# Patient Record
Sex: Female | Born: 1945
Health system: Southern US, Community
[De-identification: ages and names within clinical notes are randomized; demographics above are authoritative.]

## PROBLEM LIST (undated history)

## (undated) ENCOUNTER — Emergency Department (HOSPITAL_BASED_OUTPATIENT_CLINIC_OR_DEPARTMENT_OTHER): Admission: EM | Payer: TRICARE For Life (TFL)

## (undated) ENCOUNTER — Emergency Department: Admission: EM | Payer: Commercial Managed Care - HMO | Source: Home / Self Care

## (undated) DIAGNOSIS — E039 Hypothyroidism, unspecified: Secondary | ICD-10-CM

## (undated) DIAGNOSIS — M1990 Unspecified osteoarthritis, unspecified site: Secondary | ICD-10-CM

## (undated) DIAGNOSIS — E0590 Thyrotoxicosis, unspecified without thyrotoxic crisis or storm: Secondary | ICD-10-CM

## (undated) DIAGNOSIS — IMO0002 Reserved for concepts with insufficient information to code with codable children: Secondary | ICD-10-CM

## (undated) DIAGNOSIS — R5383 Other fatigue: Secondary | ICD-10-CM

## (undated) DIAGNOSIS — I509 Heart failure, unspecified: Secondary | ICD-10-CM

## (undated) DIAGNOSIS — J45909 Unspecified asthma, uncomplicated: Secondary | ICD-10-CM

## (undated) DIAGNOSIS — F329 Major depressive disorder, single episode, unspecified: Secondary | ICD-10-CM

## (undated) DIAGNOSIS — F419 Anxiety disorder, unspecified: Secondary | ICD-10-CM

## (undated) DIAGNOSIS — C801 Malignant (primary) neoplasm, unspecified: Secondary | ICD-10-CM

## (undated) DIAGNOSIS — J329 Chronic sinusitis, unspecified: Secondary | ICD-10-CM

## (undated) DIAGNOSIS — M25519 Pain in unspecified shoulder: Secondary | ICD-10-CM

## (undated) DIAGNOSIS — M810 Age-related osteoporosis without current pathological fracture: Secondary | ICD-10-CM

## (undated) DIAGNOSIS — J309 Allergic rhinitis, unspecified: Secondary | ICD-10-CM

## (undated) DIAGNOSIS — J449 Chronic obstructive pulmonary disease, unspecified: Secondary | ICD-10-CM

## (undated) DIAGNOSIS — R5381 Other malaise: Secondary | ICD-10-CM

## (undated) DIAGNOSIS — J42 Unspecified chronic bronchitis: Secondary | ICD-10-CM

## (undated) DIAGNOSIS — Z9981 Dependence on supplemental oxygen: Secondary | ICD-10-CM

## (undated) DIAGNOSIS — I10 Essential (primary) hypertension: Secondary | ICD-10-CM

## (undated) DIAGNOSIS — I82409 Acute embolism and thrombosis of unspecified deep veins of unspecified lower extremity: Secondary | ICD-10-CM

## (undated) HISTORY — DX: Hypothyroidism, unspecified: E03.9

## (undated) HISTORY — DX: Pain in unspecified shoulder: M25.519

## (undated) HISTORY — DX: Unspecified asthma, uncomplicated: J45.909

## (undated) HISTORY — DX: Reserved for concepts with insufficient information to code with codable children: IMO0002

## (undated) HISTORY — DX: Essential (primary) hypertension: I10

## (undated) HISTORY — PX: APPENDECTOMY: SHX54

## (undated) HISTORY — PX: OOPHORECTOMY: SHX86

## (undated) HISTORY — DX: Chronic sinusitis, unspecified: J32.9

## (undated) HISTORY — DX: Allergic rhinitis, unspecified: J30.9

## (undated) HISTORY — DX: Age-related osteoporosis without current pathological fracture: M81.0

## (undated) HISTORY — PX: ECTOPIC PREGNANCY SURGERY: SHX613

## (undated) HISTORY — DX: Other fatigue: R53.83

## (undated) HISTORY — PX: DILATION AND CURETTAGE OF UTERUS: SHX78

## (undated) HISTORY — DX: Major depressive disorder, single episode, unspecified: F32.9

## (undated) HISTORY — DX: Thyrotoxicosis, unspecified without thyrotoxic crisis or storm: E05.90

## (undated) HISTORY — DX: Other malaise: R53.81

---

## 2001-05-31 ENCOUNTER — Other Ambulatory Visit: Admission: RE | Admit: 2001-05-31 | Discharge: 2001-05-31 | Payer: Self-pay | Admitting: Internal Medicine

## 2001-07-12 ENCOUNTER — Encounter: Payer: Self-pay | Admitting: Internal Medicine

## 2001-07-12 ENCOUNTER — Ambulatory Visit (HOSPITAL_COMMUNITY): Admission: RE | Admit: 2001-07-12 | Discharge: 2001-07-12 | Payer: Self-pay | Admitting: Internal Medicine

## 2003-04-11 ENCOUNTER — Encounter: Payer: Self-pay | Admitting: Emergency Medicine

## 2003-04-11 ENCOUNTER — Emergency Department (HOSPITAL_COMMUNITY): Admission: EM | Admit: 2003-04-11 | Discharge: 2003-04-11 | Payer: Self-pay | Admitting: Emergency Medicine

## 2004-05-22 ENCOUNTER — Other Ambulatory Visit: Admission: RE | Admit: 2004-05-22 | Discharge: 2004-05-22 | Payer: Self-pay | Admitting: Internal Medicine

## 2004-12-23 ENCOUNTER — Ambulatory Visit: Payer: Self-pay | Admitting: Internal Medicine

## 2005-03-08 ENCOUNTER — Emergency Department (HOSPITAL_COMMUNITY): Admission: EM | Admit: 2005-03-08 | Discharge: 2005-03-08 | Payer: Self-pay | Admitting: Emergency Medicine

## 2005-05-23 ENCOUNTER — Ambulatory Visit: Payer: Self-pay | Admitting: Internal Medicine

## 2005-05-28 ENCOUNTER — Ambulatory Visit: Payer: Self-pay | Admitting: Internal Medicine

## 2005-05-29 ENCOUNTER — Ambulatory Visit: Payer: Self-pay | Admitting: Internal Medicine

## 2005-06-05 ENCOUNTER — Ambulatory Visit: Payer: Self-pay | Admitting: Endocrinology

## 2005-06-12 ENCOUNTER — Encounter (HOSPITAL_COMMUNITY): Admission: RE | Admit: 2005-06-12 | Discharge: 2005-07-15 | Payer: Self-pay | Admitting: Endocrinology

## 2005-06-26 ENCOUNTER — Ambulatory Visit: Payer: Self-pay | Admitting: Endocrinology

## 2005-07-11 ENCOUNTER — Ambulatory Visit (HOSPITAL_COMMUNITY): Admission: RE | Admit: 2005-07-11 | Discharge: 2005-07-11 | Payer: Self-pay | Admitting: Endocrinology

## 2005-08-27 ENCOUNTER — Ambulatory Visit: Payer: Self-pay | Admitting: Endocrinology

## 2005-09-26 ENCOUNTER — Ambulatory Visit: Payer: Self-pay | Admitting: Endocrinology

## 2005-10-07 ENCOUNTER — Ambulatory Visit: Payer: Self-pay | Admitting: Internal Medicine

## 2005-11-03 ENCOUNTER — Ambulatory Visit: Payer: Self-pay | Admitting: Endocrinology

## 2005-12-18 ENCOUNTER — Ambulatory Visit: Payer: Self-pay | Admitting: Endocrinology

## 2005-12-24 ENCOUNTER — Encounter (HOSPITAL_COMMUNITY): Admission: RE | Admit: 2005-12-24 | Discharge: 2006-01-27 | Payer: Self-pay | Admitting: Endocrinology

## 2006-01-13 ENCOUNTER — Ambulatory Visit: Payer: Self-pay | Admitting: Endocrinology

## 2006-01-27 ENCOUNTER — Ambulatory Visit: Payer: Self-pay | Admitting: Endocrinology

## 2006-06-20 HISTORY — PX: ELECTROCARDIOGRAM: SHX264

## 2006-06-29 ENCOUNTER — Ambulatory Visit: Payer: Self-pay | Admitting: Internal Medicine

## 2006-07-06 ENCOUNTER — Ambulatory Visit: Payer: Self-pay | Admitting: Internal Medicine

## 2006-08-05 ENCOUNTER — Ambulatory Visit: Payer: Self-pay | Admitting: Endocrinology

## 2006-09-09 ENCOUNTER — Ambulatory Visit: Payer: Self-pay | Admitting: Endocrinology

## 2006-12-09 ENCOUNTER — Ambulatory Visit: Payer: Self-pay | Admitting: Endocrinology

## 2006-12-09 LAB — CONVERTED CEMR LAB: TSH: 29.25 microintl units/mL — ABNORMAL HIGH (ref 0.35–5.50)

## 2007-03-09 ENCOUNTER — Ambulatory Visit: Payer: Self-pay | Admitting: Endocrinology

## 2007-03-09 LAB — CONVERTED CEMR LAB
Creatinine, Ser: 1.3 mg/dL — ABNORMAL HIGH (ref 0.4–1.2)
GFR calc non Af Amer: 44 mL/min
Hgb A1c MFr Bld: 6 % (ref 4.6–6.0)
Potassium: 4.2 meq/L (ref 3.5–5.1)
Sed Rate: 12 mm/hr (ref 0–25)

## 2007-05-01 ENCOUNTER — Encounter: Payer: Self-pay | Admitting: Internal Medicine

## 2007-05-01 DIAGNOSIS — M810 Age-related osteoporosis without current pathological fracture: Secondary | ICD-10-CM | POA: Insufficient documentation

## 2007-05-01 DIAGNOSIS — I10 Essential (primary) hypertension: Secondary | ICD-10-CM | POA: Insufficient documentation

## 2007-05-01 DIAGNOSIS — J45909 Unspecified asthma, uncomplicated: Secondary | ICD-10-CM

## 2007-05-01 DIAGNOSIS — J309 Allergic rhinitis, unspecified: Secondary | ICD-10-CM | POA: Insufficient documentation

## 2007-05-01 HISTORY — DX: Age-related osteoporosis without current pathological fracture: M81.0

## 2007-05-01 HISTORY — DX: Essential (primary) hypertension: I10

## 2007-05-01 HISTORY — DX: Unspecified asthma, uncomplicated: J45.909

## 2007-05-01 HISTORY — DX: Allergic rhinitis, unspecified: J30.9

## 2007-05-10 ENCOUNTER — Ambulatory Visit: Payer: Self-pay | Admitting: Endocrinology

## 2007-05-18 ENCOUNTER — Ambulatory Visit: Payer: Self-pay | Admitting: Internal Medicine

## 2007-05-18 LAB — CONVERTED CEMR LAB
Alkaline Phosphatase: 68 units/L (ref 39–117)
BUN: 11 mg/dL (ref 6–23)
Basophils Absolute: 0.1 10*3/uL (ref 0.0–0.1)
CO2: 35 meq/L — ABNORMAL HIGH (ref 19–32)
Calcium: 9.9 mg/dL (ref 8.4–10.5)
Chloride: 96 meq/L (ref 96–112)
Creatinine, Ser: 1 mg/dL (ref 0.4–1.2)
Crystals: NEGATIVE
Direct LDL: 111 mg/dL
Eosinophils Relative: 1.5 % (ref 0.0–5.0)
GFR calc non Af Amer: 60 mL/min
Glucose, Bld: 73 mg/dL (ref 70–99)
HCT: 46.9 % — ABNORMAL HIGH (ref 36.0–46.0)
HDL: 69.3 mg/dL (ref >39.0)
MCHC: 33.8 g/dL (ref 30.0–36.0)
MCV: 97.7 fL (ref 78.0–100.0)
Monocytes Relative: 8.6 % (ref 3.0–11.0)
Neutrophils Relative %: 49.4 % (ref 43.0–77.0)
Potassium: 4.4 meq/L (ref 3.5–5.1)
RDW: 12.7 % (ref 11.5–14.6)
Squamous Epithelial / LPF: NEGATIVE /lpf
Total CHOL/HDL Ratio: 3
Total Protein, Urine: 30 mg/dL — AB
Total Protein: 7.2 g/dL (ref 6.0–8.3)
Triglycerides: 95 mg/dL (ref 0–149)
Urobilinogen, UA: 1 (ref 0.0–1.0)
VLDL: 19 mg/dL (ref 0–40)

## 2007-05-25 ENCOUNTER — Ambulatory Visit: Payer: Self-pay | Admitting: Internal Medicine

## 2007-05-26 ENCOUNTER — Ambulatory Visit: Payer: Self-pay | Admitting: Internal Medicine

## 2007-11-23 ENCOUNTER — Ambulatory Visit: Payer: Self-pay | Admitting: Endocrinology

## 2007-11-23 DIAGNOSIS — E0590 Thyrotoxicosis, unspecified without thyrotoxic crisis or storm: Secondary | ICD-10-CM | POA: Insufficient documentation

## 2007-11-25 LAB — CONVERTED CEMR LAB: TSH: 4.44 microintl units/mL (ref 0.35–5.50)

## 2008-06-01 ENCOUNTER — Encounter: Payer: Self-pay | Admitting: Internal Medicine

## 2008-07-18 ENCOUNTER — Ambulatory Visit: Payer: Self-pay | Admitting: Internal Medicine

## 2008-07-19 LAB — CONVERTED CEMR LAB
ALT: 19 units/L (ref 0–35)
AST: 30 units/L (ref 0–37)
Albumin: 3.9 g/dL (ref 3.5–5.2)
BUN: 5 mg/dL — ABNORMAL LOW (ref 6–23)
Basophils Relative: 0.5 % (ref 0.0–3.0)
CO2: 33 meq/L — ABNORMAL HIGH (ref 19–32)
Cholesterol: 195 mg/dL (ref 0–200)
Eosinophils Absolute: 0.2 10*3/uL (ref 0.0–0.7)
Eosinophils Relative: 3.3 % (ref 0.0–5.0)
GFR calc Af Amer: 72 mL/min
GFR calc non Af Amer: 60 mL/min
Glucose, Bld: 100 mg/dL — ABNORMAL HIGH (ref 70–99)
HCT: 46.8 % — ABNORMAL HIGH (ref 36.0–46.0)
HDL: 88.8 mg/dL (ref >39.0)
Hemoglobin: 16.3 g/dL — ABNORMAL HIGH (ref 12.0–15.0)
MCV: 101.9 fL — ABNORMAL HIGH (ref 78.0–100.0)
Monocytes Absolute: 0.6 10*3/uL (ref 0.1–1.0)
Neutro Abs: 1.9 10*3/uL (ref 1.4–7.7)
RBC / HPF: NONE SEEN
RBC: 4.59 M/uL (ref 3.87–5.11)
Total Bilirubin: 0.8 mg/dL (ref 0.3–1.2)
Triglycerides: 48 mg/dL (ref 0–149)
Urine Glucose: NEGATIVE mg/dL
Urobilinogen, UA: 1 (ref 0.0–1.0)

## 2008-07-21 ENCOUNTER — Ambulatory Visit: Payer: Self-pay | Admitting: Internal Medicine

## 2008-07-21 DIAGNOSIS — F329 Major depressive disorder, single episode, unspecified: Secondary | ICD-10-CM

## 2008-07-21 DIAGNOSIS — F3289 Other specified depressive episodes: Secondary | ICD-10-CM

## 2008-07-21 DIAGNOSIS — F418 Other specified anxiety disorders: Secondary | ICD-10-CM | POA: Insufficient documentation

## 2008-07-21 HISTORY — DX: Major depressive disorder, single episode, unspecified: F32.9

## 2008-07-21 HISTORY — DX: Other specified depressive episodes: F32.89

## 2008-08-25 ENCOUNTER — Ambulatory Visit: Payer: Self-pay | Admitting: Internal Medicine

## 2008-08-25 DIAGNOSIS — IMO0002 Reserved for concepts with insufficient information to code with codable children: Secondary | ICD-10-CM

## 2008-08-25 HISTORY — DX: Reserved for concepts with insufficient information to code with codable children: IMO0002

## 2008-09-03 ENCOUNTER — Encounter: Admission: RE | Admit: 2008-09-03 | Discharge: 2008-09-03 | Payer: Self-pay | Admitting: Internal Medicine

## 2008-09-07 ENCOUNTER — Encounter: Payer: Self-pay | Admitting: Internal Medicine

## 2008-10-10 ENCOUNTER — Ambulatory Visit: Payer: Self-pay | Admitting: Internal Medicine

## 2008-10-10 DIAGNOSIS — M549 Dorsalgia, unspecified: Secondary | ICD-10-CM | POA: Insufficient documentation

## 2008-10-10 DIAGNOSIS — J019 Acute sinusitis, unspecified: Secondary | ICD-10-CM | POA: Insufficient documentation

## 2009-06-07 ENCOUNTER — Encounter: Payer: Self-pay | Admitting: Internal Medicine

## 2009-06-18 ENCOUNTER — Telehealth (INDEPENDENT_AMBULATORY_CARE_PROVIDER_SITE_OTHER): Payer: Self-pay | Admitting: *Deleted

## 2009-06-22 ENCOUNTER — Ambulatory Visit: Payer: Self-pay | Admitting: Family Medicine

## 2009-06-22 ENCOUNTER — Encounter: Payer: Self-pay | Admitting: Internal Medicine

## 2009-07-12 ENCOUNTER — Telehealth: Payer: Self-pay | Admitting: Internal Medicine

## 2009-07-13 ENCOUNTER — Telehealth: Payer: Self-pay | Admitting: Internal Medicine

## 2009-07-18 ENCOUNTER — Telehealth: Payer: Self-pay | Admitting: Internal Medicine

## 2009-07-29 ENCOUNTER — Telehealth: Payer: Self-pay | Admitting: Family Medicine

## 2009-08-20 ENCOUNTER — Ambulatory Visit: Payer: Self-pay | Admitting: Internal Medicine

## 2009-08-22 LAB — CONVERTED CEMR LAB
ALT: 22 units/L (ref 0–35)
Albumin: 4 g/dL (ref 3.5–5.2)
BUN: 7 mg/dL (ref 6–23)
Basophils Absolute: 0.1 10*3/uL (ref 0.0–0.1)
Bilirubin, Direct: 0.1 mg/dL (ref 0.0–0.3)
CO2: 37 meq/L — ABNORMAL HIGH (ref 19–32)
Eosinophils Absolute: 0.1 10*3/uL (ref 0.0–0.7)
GFR calc non Af Amer: 71.61 mL/min (ref >60)
Glucose, Bld: 109 mg/dL — ABNORMAL HIGH (ref 70–99)
MCHC: 35.3 g/dL (ref 30.0–36.0)
Monocytes Relative: 5.8 % (ref 3.0–12.0)
Neutro Abs: 2.5 10*3/uL (ref 1.4–7.7)
Neutrophils Relative %: 46.3 % (ref 43.0–77.0)
Platelets: 199 10*3/uL (ref 150.0–400.0)
Potassium: 4.3 meq/L (ref 3.5–5.1)
RBC: 4.39 M/uL (ref 3.87–5.11)
RDW: 12.9 % (ref 11.5–14.6)
Total Bilirubin: 0.5 mg/dL (ref 0.3–1.2)
Total Protein, Urine: NEGATIVE mg/dL
Total Protein: 7.3 g/dL (ref 6.0–8.3)
Urine Glucose: NEGATIVE mg/dL
Urobilinogen, UA: 0.2 (ref 0.0–1.0)
VLDL: 9.6 mg/dL (ref 0.0–40.0)
WBC: 5.3 10*3/uL (ref 4.5–10.5)

## 2009-08-23 ENCOUNTER — Ambulatory Visit: Payer: Self-pay | Admitting: Internal Medicine

## 2009-08-23 DIAGNOSIS — M25519 Pain in unspecified shoulder: Secondary | ICD-10-CM

## 2009-08-23 DIAGNOSIS — E039 Hypothyroidism, unspecified: Secondary | ICD-10-CM

## 2009-08-23 HISTORY — DX: Pain in unspecified shoulder: M25.519

## 2009-08-23 HISTORY — DX: Hypothyroidism, unspecified: E03.9

## 2010-03-21 ENCOUNTER — Telehealth: Payer: Self-pay | Admitting: Internal Medicine

## 2010-06-25 ENCOUNTER — Encounter: Payer: Self-pay | Admitting: Internal Medicine

## 2010-06-27 ENCOUNTER — Encounter: Payer: Self-pay | Admitting: Internal Medicine

## 2010-06-28 ENCOUNTER — Encounter: Payer: Self-pay | Admitting: Internal Medicine

## 2010-07-03 ENCOUNTER — Encounter: Payer: Self-pay | Admitting: Internal Medicine

## 2010-07-03 LAB — HM MAMMOGRAPHY: HM Mammogram: NORMAL

## 2010-10-10 ENCOUNTER — Ambulatory Visit: Payer: Self-pay | Admitting: Internal Medicine

## 2010-10-10 ENCOUNTER — Encounter: Payer: Self-pay | Admitting: Internal Medicine

## 2010-10-10 DIAGNOSIS — J329 Chronic sinusitis, unspecified: Secondary | ICD-10-CM

## 2010-10-10 DIAGNOSIS — R5381 Other malaise: Secondary | ICD-10-CM

## 2010-10-10 DIAGNOSIS — R5383 Other fatigue: Secondary | ICD-10-CM | POA: Insufficient documentation

## 2010-10-10 HISTORY — DX: Chronic sinusitis, unspecified: J32.9

## 2010-10-10 HISTORY — DX: Other malaise: R53.81

## 2010-10-10 LAB — CONVERTED CEMR LAB
AST: 31 units/L (ref 0–37)
Albumin: 4.2 g/dL (ref 3.5–5.2)
Alkaline Phosphatase: 64 units/L (ref 39–117)
Basophils Absolute: 0 10*3/uL (ref 0.0–0.1)
Basophils Relative: 0.7 % (ref 0.0–3.0)
Calcium: 10.1 mg/dL (ref 8.4–10.5)
Chloride: 96 meq/L (ref 96–112)
Direct LDL: 102.1 mg/dL
Eosinophils Absolute: 0.1 10*3/uL (ref 0.0–0.7)
GFR calc non Af Amer: 73.91 mL/min (ref >60.00)
Hemoglobin: 16.7 g/dL — ABNORMAL HIGH (ref 12.0–15.0)
Lymphocytes Relative: 36.1 % (ref 12.0–46.0)
MCHC: 34.3 g/dL (ref 30.0–36.0)
MCV: 100.7 fL — ABNORMAL HIGH (ref 78.0–100.0)
Monocytes Absolute: 0.5 10*3/uL (ref 0.1–1.0)
Monocytes Relative: 7.4 % (ref 3.0–12.0)
Platelets: 214 10*3/uL (ref 150.0–400.0)
Potassium: 5 meq/L (ref 3.5–5.1)
RBC: 4.85 M/uL (ref 3.87–5.11)
Sodium: 136 meq/L (ref 135–145)
TSH: 6.06 microintl units/mL — ABNORMAL HIGH (ref 0.35–5.50)
Total Bilirubin: 0.8 mg/dL (ref 0.3–1.2)
Total CHOL/HDL Ratio: 2
Total Protein: 7.5 g/dL (ref 6.0–8.3)
VLDL: 7.8 mg/dL (ref 0.0–40.0)

## 2010-10-16 ENCOUNTER — Encounter: Payer: Self-pay | Admitting: Internal Medicine

## 2010-10-16 ENCOUNTER — Telehealth (INDEPENDENT_AMBULATORY_CARE_PROVIDER_SITE_OTHER): Payer: Self-pay | Admitting: *Deleted

## 2010-11-10 ENCOUNTER — Encounter: Payer: Self-pay | Admitting: Endocrinology

## 2010-11-19 NOTE — Progress Notes (Signed)
Summary: Med Refill  Phone Note Refill Request  on March 21, 2010 12:56 PM  Refills Requested: Medication #1:  ALPRAZOLAM 0.25 MG  TABS 1 by mouth two times a day as needed nerves - please make return office visit for further refills   Dosage confirmed as above?Dosage Confirmed   Last Refilled: 05/2009   Notes: [facility] [address] [phone] Initial call taken by: Scharlene Gloss,  March 21, 2010 12:56 PM  Follow-up for Phone Call        Rx faxed to pharmacy Follow-up by: Margaret Pyle, CMA,  March 21, 2010 1:20 PM    New/Updated Medications: ALPRAZOLAM 0.25 MG  TABS (ALPRAZOLAM) 1 by mouth two times a day as needed nerves - to fill june 2 , 2011 Prescriptions: ALPRAZOLAM 0.25 MG  TABS (ALPRAZOLAM) 1 by mouth two times a day as needed nerves - to fill june 2 , 2011  #60 x 2   Entered and Authorized by:   Corwin Levins MD   Signed by:   Corwin Levins MD on 03/21/2010   Method used:   Print then Give to Patient   RxID:   [id number]  done hardcopy to [facility] side B - dahlia Corwin Levins MD  March 21, 2010 1:12 PM

## 2010-11-21 NOTE — Medication Information (Signed)
Summary: Approval/medco  Approval/medco   Imported By: Lester Rippey 10/23/2010 08:26:53  _____________________________________________________________________  External Attachment:    Type:   Image     Comment:   External Document

## 2010-11-21 NOTE — Progress Notes (Signed)
Summary: PA-Actonel  Phone Note From Pharmacy   [facility]  [address] [id number] Summary of Call: PA required on Actonel 150mg  tab, okay to change med or preceed with PA?  [id number] ID#  [id number]  Case ID [id number] Initial call taken by: Margaret Pyle, CMA,  October 16, 2010 8:21 AM  Follow-up for Phone Call        ok for PA Follow-up by: Corwin Levins MD,  October 16, 2010 10:51 AM  Additional Follow-up for Phone Call Additional follow up Details #1::        Paperwork completed and faxed. Margaret Pyle, CMA  October 16, 2010 3:55 PM   PA- Actonel approved 09/25/10-10/17/11.  Additional Follow-up by: Dagoberto Reef,  October 25, 2010 9:12 AM

## 2010-11-21 NOTE — Assessment & Plan Note (Signed)
Summary: yearly medicare physical-lb   Vital Signs:  Patient profile:   65 year old female Height:      60 inches Weight:      105.38 pounds BMI:     20.66 O2 Sat:      97 % on Room air Temp:     97.8 degrees F oral Pulse rate:   66 / minute BP sitting:   110 / 64  (left arm) Cuff size:   regular  Vitals Entered By: Zella Ball Ewing CMA Duncan Dull) (October 10, 2010 10:12 AM)  O2 Flow:  Room air  Preventive Care Screening  Last Flu Shot:    Date:  10/10/2010    Results:  Fluvax 3+  Mammogram:    Date:  07/03/2010    Results:  normal   CC: Yearly/RE   CC:  Yearly/RE.  History of Present Illness: here for f/u; overall doing ok;  Pt denies CP, worsening sob, doe, wheezing, orthopnea, pnd, worsening LE edema, palps, dizziness or syncope Pt denies new neuro symptoms such as headache, facial or extremity weakness, no night time awakenings with wheezing, but has been using the proair more per month than prescribed.    Pt denies polydipsia, polyuria, or low sugar symptoms such as shakiness improved with eating.  Overall good compliance with meds, trying to follow low chol diet, wt stable, little excercise however No fever, wt loss, night sweats, loss of appetite or other constitutional symptoms Overall good compliance with meds, and good tolerability.  Denies worsening depressive symptoms, suicidal ideation, or panic.  Pt states good ability with ADL's, low fall risk, home safety reviewed and adequate, no significant change in hearing or vision, trying to follow lower chol diet, and occasionally active only with regular excercise. Denies hyper or hypothyroid symtpoms such as wt/voice/skin change.  Does have mild ongoing fatigue but no daytime somnolence.  Does have less sense of smell and bad breath over the last 2 months, without headache, sinus pain, pressure, d/c, or fever or blood, despite current meds  Preventive Screening-Counseling & Management      Drug Use:  no.    Problems Prior to  Update: 1)  Sinusitis, Chronic  (ICD-473.9) 2)  Fatigue  (ICD-780.79) 3)  Sinusitis- Acute-nos  (ICD-461.9) 4)  Need Prophylactic Vaccination&inoculation Flu  (ICD-V04.81) 5)  Shoulder Pain, Left  (ICD-719.41) 6)  Hypothyroidism  (ICD-244.9) 7)  Back Pain  (ICD-724.5) 8)  Sinusitis- Acute-nos  (ICD-461.9) 9)  Lumbar Radiculopathy, Right  (ICD-724.4) 10)  Depression  (ICD-311) 11)  Preventive Health Care  (ICD-V70.0) 12)  Hyperthyroidism  (ICD-242.90) 13)  Asthma  (ICD-493.90) 14)  Osteoporosis  (ICD-733.00) 15)  Hypertension  (ICD-401.9) 16)  Allergic Rhinitis  (ICD-477.9)  Medications Prior to Update: 1)  Calcium 600 600 Mg  Tabs (Calcium Carbonate) .... Take 1 Two Times A Day By Mouth Qd 2)  Triamterene-Hctz 37.5-25 Mg  Tabs (Triamterene-Hctz) .... Take 1 By Mouth Once Daily--No Addtnl Refills Pt Due For Appt With Physcian 3)  Alprazolam 0.25 Mg  Tabs (Alprazolam) .[name].. 1 By Mouth Two Times A Day As Needed Nerves - To Fill June 2 , 2011 4)  Albuterol 90 Mcg/act  Aers (Albuterol) .... Use Asd 2 Puffs Qid As Needed 5)  Levothyroxine Sodium 50 Mcg Tabs (Levothyroxine Sodium) .[name].. 1po Once Daily 6)  Advair Diskus 100-50 Mcg/dose  Misc (Fluticasone-Salmeterol) .[name].. 1 Puff Two Times A Day 7)  Citalopram Hydrobromide 10 Mg Tabs (Citalopram Hydrobromide) .[name].. 1po Once Daily 8)  Adult Aspirin  Ec Low Strength 81 Mg Tbec (Aspirin) .[name].. 1 By Mouth Once Daily 9)  Cetirizine Hcl 10 Mg Tabs (Cetirizine Hcl) .[name].. 1po Once Daily As Needed Allergy 10)  Actonel 150 Mg Tabs (Risedronate Sodium) .[name].. 1 By Mouth Q Month 11)  Proair Hfa 108 (90 Base) Mcg/act Aers (Albuterol Sulfate) .... Take 2 Puffs Qid As Needed 12)  Cephalexin 500 Mg Caps (Cephalexin) .[name].. 1 By Mouth Three Times A Day  Current Medications (verified): 1)  Calcium 600 600 Mg  Tabs (Calcium Carbonate) .... Take 1 Two Times A Day By Mouth Qd 2)  Triamterene-Hctz 37.5-25 Mg  Tabs (Triamterene-Hctz) .... Take 1 By Mouth Once Daily 3)   Alprazolam 0.25 Mg  Tabs (Alprazolam) .[name].. 1 By Mouth Two Times A Day As Needed Nerves - To Fill Oct 10, 2010 4)  Levothyroxine Sodium 50 Mcg Tabs (Levothyroxine Sodium) .[name].. 1po Once Daily 5)  Advair Diskus 250-50 Mcg/dose Aepb (Fluticasone-Salmeterol) .[name].. 1 Puff Two Times A Day 6)  Citalopram Hydrobromide 10 Mg Tabs (Citalopram Hydrobromide) .[name].. 1po Once Daily 7)  Adult Aspirin Ec Low Strength 81 Mg Tbec (Aspirin) .[name].. 1 By Mouth Once Daily 8)  Cetirizine Hcl 10 Mg Tabs (Cetirizine Hcl) .[name].. 1po Once Daily As Needed Allergy 9)  Actonel 150 Mg Tabs (Risedronate Sodium) .[name].. 1 By Mouth Q Month 10)  Proair Hfa 108 (90 Base) Mcg/act Aers (Albuterol Sulfate) .... Take 2 Puffs Qid As Needed 11)  Fluticasone Propionate 50 Mcg/act Susp (Fluticasone Propionate) .... 2 Spray/side Once Daily  Allergies (verified): 1)  ! Fosamax 2)  ! * Asprin  Past History:  Past Medical History: Last updated: 07/21/2008 Allergic rhinitis Hypertension Osteoporosis Hypothyroidism History of Peptic Ulcers Disease History of Degenarative joint disease of right shoulder Asthma Depression  Past Surgical History: Last updated: 05/01/2007 Appendectomy Ectopic Pregnacy Post Fallopian tube removal on right side History of multiple dialations and curettages and miscarriages, unfortunaely never carring a child to term. 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   Head:  normocephalic and atraumatic.   Eyes:  vision grossly intact, pupils equal, and pupils round.   Ears:  R ear normal and L ear normal.   Nose:  nasal dischargemucosal pallor and mucosal edema.   Mouth:  pharyngeal erythema and fair dentition.   Neck:  supple and no masses.   Lungs:  normal respiratory effort and normal breath sounds.   Heart:  normal rate and regular rhythm.   Abdomen:  soft, non-tender, and normal bowel sounds.   Msk:  no joint tenderness and no joint swelling.   Extremities:  no edema, no erythema  Neurologic:  strength normal in all extremities and gait normal.   Skin:  color normal and no rashes.   Psych:  not depressed appearing and slightly anxious.     Impression & Recommendations:  Problem # 1:  SINUSITIS, CHRONIC (ICD-473.9)  to start the flonase nasal, Continue all previous  medications as before this visit, consider allergy referral  The following medications were removed from the medication list:    Cephalexin 500 Mg Caps (Cephalexin) .[name]... 1 by mouth three times a day Her updated medication list for this problem includes:    Fluticasone Propionate 50 Mcg/act Susp (Fluticasone propionate) .[name]... 2 spray/side once daily treat as above, f/u any worsening signs or symptoms   Problem # 2:  OSTEOPOROSIS (ICD-733.00)  Her updated medication list for this problem includes:    Actonel 150 Mg Tabs (Risedronate sodium) .[name]... 1 by mouth q month to re-start the actonel, tolerated well, most recent dxa d/w pt  Problem # 3:  ASTHMA (ICD-493.90)  The following medications were removed from the medication list:    Albuterol 90 Mcg/act Aers (Albuterol) ..... Use asd 2 puffs qid as needed Her updated medication list for this problem includes:    Advair Diskus 250-50 Mcg/dose Aepb (Fluticasone-salmeterol) .[name]... 1 puff two times a day    Proair Hfa 108 (90 Base) Mcg/act Aers (Albuterol sulfate) .[name]... Take 2 puffs qid  as needed to increase the advair for uncotnrolled symtpoms, cont the proair hfa as is  Problem # 4:  HYPERTENSION (ICD-401.9)  Her updated medication list for this problem includes:    Triamterene-hctz 37.5-25 Mg Tabs (Triamterene-hctz) .[name]... Take 1 by mouth once daily  BP today: 110/64 Prior BP: 112/80 (08/23/2009)  Labs Reviewed: K+: 4.3 (08/20/2009) Creat: : 1.0 (08/20/2009)   Chol: 202 (08/20/2009)   HDL: 94.90 (08/20/2009)   LDL: 97 (07/18/2008)   TG: 48.0 (08/20/2009) stable overall by hx and exam, ok to continue meds/tx as is   Problem # 5:  FATIGUE (ICD-780.79) exam benign, to check labs below; follow with expectant management  Orders: TLB-BMP (Basic Metabolic Panel-BMET) (80048-METABOL) TLB-CBC Platelet - w/Differential (85025-CBCD) TLB-Hepatic/Liver Function Pnl (80076-HEPATIC) TLB-TSH (Thyroid Stimulating Hormone) (84443-TSH)  Problem # 6:  HYPOTHYROIDISM (ICD-244.9)  Her updated medication list for this problem includes:    Levothyroxine Sodium 50 Mcg Tabs (Levothyroxine sodium) .[name]... 1po once daily  Orders: TLB-Lipid Panel (80061-LIPID) stable overall by hx and exam, ok to continue meds/tx as is   Complete Medication List: 1)  Calcium 600 600 Mg Tabs (Calcium carbonate) .... Take 1 two times a day by mouth qd 2)  Triamterene-hctz 37.5-25 Mg Tabs (Triamterene-hctz) .... Take 1 by mouth once daily 3)  Alprazolam 0.25 Mg Tabs (Alprazolam) .[name].. 1 by mouth two times a day as needed nerves - to fill Oct 10, 2010 4)  Levothyroxine Sodium 50 Mcg Tabs (Levothyroxine sodium) .[name].. 1po once daily 5)  Advair Diskus 250-50 Mcg/dose Aepb (Fluticasone-salmeterol) .[name].. 1 puff two times a day 6)  Citalopram Hydrobromide 10 Mg Tabs (Citalopram hydrobromide) .[name].. 1po once daily 7)  Adult Aspirin Ec Low Strength 81 Mg Tbec (Aspirin) .[name].. 1 by mouth once daily 8)  Cetirizine Hcl 10 Mg Tabs (Cetirizine hcl) .[name].. 1po once daily as needed allergy 9)  Actonel 150 Mg Tabs (Risedronate  sodium) .[name].. 1 by mouth q month 10)  Proair Hfa 108 (90 Base) Mcg/act Aers (Albuterol sulfate) .... Take 2 puffs qid as needed 11)  Fluticasone Propionate 50 Mcg/act Susp (Fluticasone propionate) .... 2 spray/side once daily  Other Orders: EKG w/ Interpretation (93000) Flu Vaccine 83yrs + MEDICARE PATIENTS (Z6109) Administration Flu vaccine - MCR (U0454)  Patient Instructions: 1)  you had the flu shot today 2)  you are given the refills today - all sent to the pharmacy except for the alprazolam 3)  please stop smoking 4)  Please go to the Lab in the basement for your blood and/or urine tests today 5)  Please call the number on the [facility] Card for results of your testing  6)  Please schedule a follow-up appointment in 6 months. Prescriptions: TRIAMTERENE-HCTZ 37.5-25 MG  TABS (TRIAMTERENE-HCTZ) TAKE 1 by mouth once daily  #90 x 3   Entered and Authorized by:   Corwin Levins MD   Signed by:   Corwin Levins MD on 10/10/2010   Method used:   [facility]        [facility]  [address] [id number]       [address]       [place], [place]  [address]       Ph: [phone] or [phone]       Fax: [phone]   RxID:   [id number] ALPRAZOLAM 0.25 MG  TABS (ALPRAZOLAM) 1 by mouth two times a day as needed nerves - to fill Oct 10, 2010  #60 x 2   Entered and Authorized by:   Corwin Levins MD   Signed by:   Corwin Levins MD on 10/10/2010   Method used:   Print then Give to Patient   RxID:   [id number] LEVOTHYROXINE SODIUM 50 MCG TABS (LEVOTHYROXINE SODIUM) 1po once daily  #90 x 3   Entered and Authorized by:   Corwin Levins MD   Signed by:   Corwin Levins MD on 10/10/2010   Method used:   [facility]        [facility]  [address] [id number]       [address]       [place], [place]  [address]       Ph: [phone] or [phone]       Fax: [phone]   RxID:   [id number] ADVAIR DISKUS 250-50 MCG/DOSE AEPB  (FLUTICASONE-SALMETEROL) 1 puff two times a day  #1 x 11   Entered and Authorized by:   Corwin Levins MD   Signed by:   Corwin Levins MD on 10/10/2010   Method used:   [facility]ically [facility]        [facility]  [address] [id number]       [address]       [place], [place]  [address]       Ph: [phone] or [phone]       Fax: [phone]   RxID:   [id number] CITALOPRAM HYDROBROMIDE 10 MG TABS (CITALOPRAM HYDROBROMIDE) 1po once daily  #90 x 3   Entered and Authorized by:   Corwin Levins MD   Signed by:   Corwin Levins MD on 10/10/2010   Method used:   [facility]        [facility]  [address] [facility]       [address]       [place], [place]  [address]       Ph: [phone] or [phone]       Fax: [phone]   RxID:   [id number] CETIRIZINE HCL 10 MG TABS (CETIRIZINE HCL) 1po once daily as needed allergy  #30 x 11   Entered and Authorized by:   Corwin Levins MD   Signed by:   Corwin Levins MD on 10/10/2010  Method used:   [facility]        [facility]  [address] [facility]       [address]       [place], [place]  [address]       Ph: [phone] or [phone]       Fax: [phone]   RxID:   [id number] ACTONEL 150 MG TABS (RISEDRONATE SODIUM) 1 by mouth q month  #3 x 3   Entered and Authorized by:   Corwin Levins MD   Signed by:   Corwin Levins MD on 10/10/2010   Method used:   [facility]ically [facility]        [facility]  [address] [facility]       [address]       [place], [place]  [address]       Ph: [phone] or [phone]       Fax: [phone]   RxID:   [id number] PROAIR HFA 108 (90 BASE) MCG/ACT AERS (ALBUTEROL SULFATE) take 2 puffs qid as needed  #1 x 11   Entered and Authorized by:   Corwin Levins MD   Signed by:   Corwin Levins MD on 10/10/2010   Method used:   [facility]ronically to        [facility]  [address] [facility]       [address]        [place], [place]  [address]       Ph: [phone] or [phone]       Fax: [phone]   RxID:   [id number] FLUTICASONE PROPIONATE 50 MCG/ACT SUSP (FLUTICASONE PROPIONATE) 2 spray/side once daily  #1 x 11   Entered and Authorized by:   Corwin Levins MD   Signed by:   Corwin Levins MD on 10/10/2010   Method used:   [facility]        [facility]  [address] [id number]       [address]       [place], [place]  [address]       Ph: [phone] or [phone]       Fax: [phone]   RxID:   [id number]    Orders Added: 1)  EKG w/ Interpretation [93000] 2)  Flu Vaccine 26yrs + MEDICARE PATIENTS [Q2039] 3)  Administration Flu vaccine - MCR [G0008] 4)  TLB-BMP (Basic Metabolic Panel-BMET) [80048-METABOL] 5)  TLB-CBC Platelet - w/Differential [85025-CBCD] 6)  TLB-Hepatic/Liver Function Pnl [80076-HEPATIC] 7)  TLB-TSH (Thyroid Stimulating Hormone) [84443-TSH] 8)  TLB-Lipid Panel [80061-LIPID] 9)  Est. Patient Level IV [76734]   Immunizations Administered:  Influenza Vaccine # 1:    Vaccine Type: Fluvax 3+    Site: left deltoid    Mfr: Sanofi Pasteur    Dose: 0.5 ml    Route: IM    Given by: Zella Ball Ewing CMA (AAMA)    Exp. Date: 04/19/2011    Lot #: [id number]    VIS given: 05/14/10 version given October 10, 2010.   Immunizations Administered:  Influenza Vaccine # 1:    Vaccine Type: Fluvax 3+    Site: left deltoid    Mfr: Sanofi Pasteur    Dose: 0.5 ml    Route: IM    Given by: Zella Ball Ewing CMA (AAMA)    Exp. Date: 04/19/2011  Lot #: [id number]    VIS given: 05/14/10 version given October 10, 2010.

## 2011-04-09 ENCOUNTER — Encounter: Payer: Self-pay | Admitting: Internal Medicine

## 2011-04-09 DIAGNOSIS — Z515 Encounter for palliative care: Secondary | ICD-10-CM | POA: Insufficient documentation

## 2011-04-10 ENCOUNTER — Ambulatory Visit: Payer: Self-pay | Admitting: Internal Medicine

## 2011-04-10 DIAGNOSIS — Z0289 Encounter for other administrative examinations: Secondary | ICD-10-CM

## 2011-04-14 ENCOUNTER — Other Ambulatory Visit: Payer: Self-pay

## 2011-04-14 MED ORDER — ALPRAZOLAM 0.25 MG PO TABS: 0.2500 mg | ORAL_TABLET | Freq: Two times a day (BID) | ORAL | Status: DC

## 2011-04-14 NOTE — Telephone Encounter (Signed)
Faxed hardcopy to pharmacy. 

## 2011-07-10 ENCOUNTER — Ambulatory Visit (INDEPENDENT_AMBULATORY_CARE_PROVIDER_SITE_OTHER): Payer: MEDICARE

## 2011-07-10 ENCOUNTER — Telehealth: Payer: Self-pay

## 2011-07-10 DIAGNOSIS — Z0000 Encounter for general adult medical examination without abnormal findings: Secondary | ICD-10-CM

## 2011-07-10 DIAGNOSIS — Z23 Encounter for immunization: Secondary | ICD-10-CM

## 2011-07-10 NOTE — Telephone Encounter (Signed)
Put order in for physical labs. 

## 2011-07-31 ENCOUNTER — Telehealth: Payer: Self-pay

## 2011-07-31 NOTE — Telephone Encounter (Signed)
Patient called informing of UTI symptoms and wanting to discuss with Dr. Jonny Ruiz a portable nebulizer. Informed the patient would need to schedule OV with Dr. Jonny Ruiz, she agreed to do so.

## 2011-08-11 ENCOUNTER — Encounter: Payer: Self-pay | Admitting: Internal Medicine

## 2011-08-11 ENCOUNTER — Ambulatory Visit (INDEPENDENT_AMBULATORY_CARE_PROVIDER_SITE_OTHER): Payer: MEDICARE | Admitting: Internal Medicine

## 2011-08-11 VITALS — BP 108/60 | HR 85 | Temp 98.0°F | Ht 61.0 in | Wt 117.2 lb

## 2011-08-11 DIAGNOSIS — I10 Essential (primary) hypertension: Secondary | ICD-10-CM

## 2011-08-11 DIAGNOSIS — N760 Acute vaginitis: Secondary | ICD-10-CM | POA: Insufficient documentation

## 2011-08-11 DIAGNOSIS — N390 Urinary tract infection, site not specified: Secondary | ICD-10-CM

## 2011-08-11 DIAGNOSIS — M549 Dorsalgia, unspecified: Secondary | ICD-10-CM

## 2011-08-11 LAB — POCT URINALYSIS DIPSTICK
Bilirubin, UA: NEGATIVE
Blood, UA: NEGATIVE
Glucose, UA: NEGATIVE
Protein, UA: NEGATIVE
Spec Grav, UA: 1.002

## 2011-08-11 MED ORDER — METRONIDAZOLE 250 MG PO TABS: 250.0000 mg | ORAL_TABLET | Freq: Three times a day (TID) | ORAL | Status: AC

## 2011-08-11 MED ORDER — CELECOXIB 200 MG PO CAPS: 200.0000 mg | ORAL_CAPSULE | Freq: Two times a day (BID) | ORAL | Status: DC

## 2011-08-11 NOTE — Progress Notes (Signed)
Subjective:    Patient ID: Jasmine [name], female    DOB: 11-26-1945, 65 y.o.   MRN: [id number]  HPI  Here to f/u;  Had recent pap per NP at her GYN with neg pap but tx for vaginitis with metronidazole course for 5 days with clearing of the d/c and di dnot have to do the cream rx as well, still has the tube and asks for re-tx and will make sure she does the cream as well.   Denies urinary symptoms such as dysuria, frequency, urgency,or hematuria. UA dip in the office neg today.  Also mentions she is s/p RLE fx 2 yrs ago, and Pt continues to have recurring LBP without change in severity except mild increase in the past 2 wks, worse at night, better during the day, did better in the past with celebrex, but no bowel or bladder change, fever, wt loss,  worsening LE pain/numbness/weakness, gait change or falls.  Pt denies chest pain, increased sob or doe, wheezing, orthopnea, PND, increased LE swelling, palpitations, dizziness or syncope.  Pt denies new neurological symptoms such as new headache, or facial or extremity weakness or numbness   Pt denies polydipsia, polyuria. Past Medical History  Diagnosis Date  . ALLERGIC RHINITIS 05/01/2007  . ASTHMA 05/01/2007  . BACK PAIN 10/10/2008  . DEPRESSION 07/21/2008  . FATIGUE 10/10/2010  . HYPERTENSION 05/01/2007  . HYPERTHYROIDISM 11/23/2007  . HYPOTHYROIDISM 08/23/2009  . LUMBAR RADICULOPATHY, RIGHT 08/25/2008  . OSTEOPOROSIS 05/01/2007  . SHOULDER PAIN, LEFT 08/23/2009  . SINUSITIS, CHRONIC 10/10/2010  . SINUSITIS- ACUTE-NOS 10/10/2008   Past Surgical History  Procedure Date  . Appendectomy   . Ectopic pregnancy surgery   . Post fallopian tube removal right side   . History of multiple dialations and curettages and miscarriages, unfortunately never carrying a child to term   . Electrocardiogram 06/20/2006    reports that she has been smoking.  She does not have any smokeless tobacco history on file. She reports that she drinks alcohol. She reports that she  does not use illicit drugs. family history includes Alcohol abuse in her brother; Diabetes in her brother; and Hypertension in her other. Allergies  Allergen Reactions  . Alendronate Sodium    Current Outpatient Prescriptions on File Prior to Visit  Medication Sig Dispense Refill  . albuterol (PROAIR HFA) 108 (90 BASE) MCG/ACT inhaler Inhale 2 puffs into the lungs 4 (four) times daily.        [name] ALPRAZolam (XANAX) 0.25 MG tablet Take 1 tablet (0.25 mg total) by mouth 2 (two) times daily.  60 tablet  2  . aspirin 81 MG EC tablet Take 81 mg by mouth daily.        . calcium carbonate (OS-CAL) 600 MG TABS Take 600 mg by mouth 2 (two) times daily.        . cetirizine (ZYRTEC) 10 MG tablet Take 10 mg by mouth daily.        . citalopram (CELEXA) 10 MG tablet Take 10 mg by mouth daily.        . fluticasone (FLONASE) 50 MCG/ACT nasal spray Place 2 sprays into the nose daily.        . Fluticasone-Salmeterol (ADVAIR DISKUS) 250-50 MCG/DOSE AEPB Inhale 1 puff into the lungs 2 (two) times daily.        [name] levothyroxine (SYNTHROID, LEVOTHROID) 50 MCG tablet Take 50 mcg by mouth daily.        . risedronate (ACTONEL) 150 MG tablet Take 150  mg by mouth every 30 (thirty) days. with water on empty stomach, nothing by mouth or lie down for next 30 minutes.       . triamterene-hydrochlorothiazide (DYAZIDE) 37.5-25 MG per capsule Take 1 capsule by mouth daily.         Review of Systems Review of Systems  Constitutional: Negative for diaphoresis and unexpected weight change.  HENT: Negative for drooling and tinnitus.   Eyes: Negative for photophobia and visual disturbance.  Respiratory: Negative for choking and stridor.   Gastrointestinal: Negative for vomiting and blood in stool.  Genitourinary: Negative for hematuria and decreased urine volume.  Musculoskeletal: Negative for gait problem.  Skin: Negative for color change and wound.  Neurological: Negative for tremors and numbness.  Psychiatric/Behavioral:  Negative for decreased concentration. The patient is not hyperactive.      Objective:   Physical Exam BP 108/60  Pulse 85  Temp(Src) 98 F (36.7 C) (Oral)  Ht 5\' 1"  (1.549 m)  Wt 117 lb 4 oz (53.184 kg)  BMI 22.15 kg/m2  SpO2 90% Physical Exam  VS noted Constitutional: Pt appears well-developed and well-nourished.  HENT: Head: Normocephalic.  Right Ear: External ear normal.  Left Ear: External ear normal.  Eyes: Conjunctivae and EOM are normal. Pupils are equal, round, and reactive to light.  Neck: Normal range of motion. Neck supple.  Cardiovascular: Normal rate and regular rhythm.   Pulmonary/Chest: Effort normal and breath sounds normal.  Abd:  Soft, NT, non-distended, + BS Neurological: Pt is alert. No cranial nerve deficit. motor, sens, dtr intact, gait normal Skin: Skin is warm. No erythema.  Spine: nontender throughout Psychiatric: Pt behavior is normal. Thought content normal.     Assessment & Plan:

## 2011-08-11 NOTE — Assessment & Plan Note (Signed)
Presumed bacterial recurrent, for repeat flagyl course,  to f/u any worsening symptoms or concerns to GYN

## 2011-08-11 NOTE — Assessment & Plan Note (Signed)
Mild, right sided, recurent, exam benign, likely to be assoc with underlying DJD or DDD or similar;  For celebrex bid prn as this has also helped in the past;  No need for other pain med/prednisone or muscle relaxer at this time;   to f/u any worsening symptoms or concerns

## 2011-08-11 NOTE — Patient Instructions (Addendum)
Take all new medications as prescribed Continue all other medications as before Please return in 2 mo with Lab testing done 3-5 days before

## 2011-08-11 NOTE — Assessment & Plan Note (Signed)
stable overall by hx and exam, most recent data reviewed with pt, and pt to continue medical treatment as before  BP Readings from Last 3 Encounters:  08/11/11 108/60  10/10/10 110/64  08/23/09 112/80

## 2011-08-26 ENCOUNTER — Telehealth: Payer: Self-pay

## 2011-08-26 NOTE — Telephone Encounter (Signed)
The patient called with some confusion on what the check out sheet was she received at the end of her recent OV. I gave the patient a detailed explanation of the patient check out summary and what it all meant, the patient did understand once it was explained to her and she appreciated the call back.

## 2011-09-24 ENCOUNTER — Other Ambulatory Visit: Payer: Self-pay | Admitting: Internal Medicine

## 2011-10-16 ENCOUNTER — Encounter: Payer: MEDICARE | Admitting: Internal Medicine

## 2011-10-16 DIAGNOSIS — Z0289 Encounter for other administrative examinations: Secondary | ICD-10-CM

## 2011-10-20 ENCOUNTER — Other Ambulatory Visit: Payer: Self-pay | Admitting: Internal Medicine

## 2011-10-31 ENCOUNTER — Other Ambulatory Visit: Payer: Self-pay | Admitting: Internal Medicine

## 2011-11-03 ENCOUNTER — Other Ambulatory Visit: Payer: Self-pay

## 2011-11-03 MED ORDER — TRIAMTERENE-HCTZ 37.5-25 MG PO CAPS: 1.0000 | ORAL_CAPSULE | Freq: Every day | ORAL | Status: DC

## 2011-11-09 ENCOUNTER — Emergency Department (INDEPENDENT_AMBULATORY_CARE_PROVIDER_SITE_OTHER): Payer: Medicare Other

## 2011-11-09 ENCOUNTER — Encounter (HOSPITAL_COMMUNITY): Payer: Self-pay | Admitting: *Deleted

## 2011-11-09 ENCOUNTER — Emergency Department (INDEPENDENT_AMBULATORY_CARE_PROVIDER_SITE_OTHER)
Admission: EM | Admit: 2011-11-09 | Discharge: 2011-11-09 | Disposition: A | Discharge status: Nursing Home (Includes ICF) | Payer: MEDICARE | Source: Home / Self Care | Attending: Family Medicine | Admitting: Family Medicine

## 2011-11-09 ENCOUNTER — Emergency Department (HOSPITAL_COMMUNITY): Payer: Medicare Other

## 2011-11-09 ENCOUNTER — Other Ambulatory Visit: Payer: Self-pay

## 2011-11-09 ENCOUNTER — Inpatient Hospital Stay (HOSPITAL_COMMUNITY)
Admission: EM | Admit: 2011-11-09 | Discharge: 2011-11-12 | DRG: 190 | Disposition: A | Discharge status: Home Health | Payer: Medicare Other | Attending: Internal Medicine | Admitting: Internal Medicine

## 2011-11-09 ENCOUNTER — Encounter (HOSPITAL_COMMUNITY): Payer: Self-pay | Admitting: Emergency Medicine

## 2011-11-09 DIAGNOSIS — E0500 Thyrotoxicosis with diffuse goiter without thyrotoxic crisis or storm: Secondary | ICD-10-CM | POA: Diagnosis present

## 2011-11-09 DIAGNOSIS — J90 Pleural effusion, not elsewhere classified: Secondary | ICD-10-CM | POA: Diagnosis present

## 2011-11-09 DIAGNOSIS — E875 Hyperkalemia: Secondary | ICD-10-CM | POA: Diagnosis present

## 2011-11-09 DIAGNOSIS — R0902 Hypoxemia: Secondary | ICD-10-CM

## 2011-11-09 DIAGNOSIS — J45901 Unspecified asthma with (acute) exacerbation: Secondary | ICD-10-CM

## 2011-11-09 DIAGNOSIS — I279 Pulmonary heart disease, unspecified: Secondary | ICD-10-CM | POA: Diagnosis present

## 2011-11-09 DIAGNOSIS — R0789 Other chest pain: Secondary | ICD-10-CM

## 2011-11-09 DIAGNOSIS — I2789 Other specified pulmonary heart diseases: Secondary | ICD-10-CM | POA: Diagnosis present

## 2011-11-09 DIAGNOSIS — D7589 Other specified diseases of blood and blood-forming organs: Secondary | ICD-10-CM | POA: Diagnosis present

## 2011-11-09 DIAGNOSIS — J45909 Unspecified asthma, uncomplicated: Secondary | ICD-10-CM

## 2011-11-09 DIAGNOSIS — F1910 Other psychoactive substance abuse, uncomplicated: Secondary | ICD-10-CM | POA: Diagnosis present

## 2011-11-09 DIAGNOSIS — Z79899 Other long term (current) drug therapy: Secondary | ICD-10-CM

## 2011-11-09 DIAGNOSIS — Z7982 Long term (current) use of aspirin: Secondary | ICD-10-CM

## 2011-11-09 DIAGNOSIS — IMO0001 Reserved for inherently not codable concepts without codable children: Secondary | ICD-10-CM

## 2011-11-09 DIAGNOSIS — F329 Major depressive disorder, single episode, unspecified: Secondary | ICD-10-CM | POA: Diagnosis present

## 2011-11-09 DIAGNOSIS — J40 Bronchitis, not specified as acute or chronic: Secondary | ICD-10-CM | POA: Diagnosis present

## 2011-11-09 DIAGNOSIS — R0602 Shortness of breath: Secondary | ICD-10-CM

## 2011-11-09 DIAGNOSIS — J441 Chronic obstructive pulmonary disease with (acute) exacerbation: Principal | ICD-10-CM | POA: Diagnosis present

## 2011-11-09 DIAGNOSIS — I10 Essential (primary) hypertension: Secondary | ICD-10-CM | POA: Diagnosis present

## 2011-11-09 DIAGNOSIS — F17200 Nicotine dependence, unspecified, uncomplicated: Secondary | ICD-10-CM | POA: Diagnosis present

## 2011-11-09 DIAGNOSIS — IMO0002 Reserved for concepts with insufficient information to code with codable children: Secondary | ICD-10-CM

## 2011-11-09 DIAGNOSIS — F411 Generalized anxiety disorder: Secondary | ICD-10-CM | POA: Diagnosis present

## 2011-11-09 DIAGNOSIS — J9601 Acute respiratory failure with hypoxia: Secondary | ICD-10-CM | POA: Diagnosis present

## 2011-11-09 DIAGNOSIS — E871 Hypo-osmolality and hyponatremia: Secondary | ICD-10-CM | POA: Diagnosis present

## 2011-11-09 DIAGNOSIS — E039 Hypothyroidism, unspecified: Secondary | ICD-10-CM | POA: Diagnosis present

## 2011-11-09 DIAGNOSIS — J9600 Acute respiratory failure, unspecified whether with hypoxia or hypercapnia: Secondary | ICD-10-CM | POA: Diagnosis present

## 2011-11-09 DIAGNOSIS — R9389 Abnormal findings on diagnostic imaging of other specified body structures: Secondary | ICD-10-CM | POA: Diagnosis present

## 2011-11-09 DIAGNOSIS — F3289 Other specified depressive episodes: Secondary | ICD-10-CM | POA: Diagnosis present

## 2011-11-09 LAB — CBC
MCH: 34.1 pg — ABNORMAL HIGH (ref 26.0–34.0)
MCHC: 33.7 g/dL (ref 30.0–36.0)
Platelets: 151 10*3/uL (ref 150–400)
RDW: 15 % (ref 11.5–15.5)

## 2011-11-09 LAB — DIFFERENTIAL
Basophils Absolute: 0 10*3/uL (ref 0.0–0.1)
Basophils Relative: 1 % (ref 0–1)
Eosinophils Absolute: 0 10*3/uL (ref 0.0–0.7)
Neutro Abs: 2.5 10*3/uL (ref 1.7–7.7)
Neutrophils Relative %: 52 % (ref 43–77)

## 2011-11-09 LAB — BASIC METABOLIC PANEL
BUN: 9 mg/dL (ref 6–23)
Chloride: 95 mEq/L — ABNORMAL LOW (ref 96–112)
Creatinine, Ser: 0.79 mg/dL (ref 0.50–1.10)
GFR calc Af Amer: >90 mL/min (ref >90)
GFR calc non Af Amer: 84 mL/min — ABNORMAL LOW (ref >90)
Potassium: 4.5 mEq/L (ref 3.5–5.1)

## 2011-11-09 LAB — TROPONIN I: Troponin I: <0.3 ng/mL (ref <0.30)

## 2011-11-09 MED ORDER — SENNA 8.6 MG PO TABS
1.0000 | ORAL_TABLET | Freq: Two times a day (BID) | ORAL | Status: DC
  Administered 2011-11-10 – 2011-11-12 (×3): 8.6 mg via ORAL
  Filled 2011-11-09 (×8): qty 1

## 2011-11-09 MED ORDER — ONDANSETRON HCL 4 MG/2ML IJ SOLN: 4.0000 mg | Freq: Four times a day (QID) | INTRAMUSCULAR | Status: DC | PRN

## 2011-11-09 MED ORDER — ALBUTEROL SULFATE (5 MG/ML) 0.5% IN NEBU: 2.5000 mg | INHALATION_SOLUTION | RESPIRATORY_TRACT | Status: DC | PRN

## 2011-11-09 MED ORDER — PREDNISONE 10 MG PO TABS
10.0000 mg | ORAL_TABLET | Freq: Every day | ORAL | Status: DC
  Administered 2011-11-10: 10 mg via ORAL
  Filled 2011-11-09 (×2): qty 1

## 2011-11-09 MED ORDER — SODIUM CHLORIDE 0.9 % IV SOLN
Freq: Once | INTRAVENOUS | Status: AC
  Administered 2011-11-10: 01:00:00 via INTRAVENOUS

## 2011-11-09 MED ORDER — CITALOPRAM HYDROBROMIDE 10 MG PO TABS
10.0000 mg | ORAL_TABLET | Freq: Every day | ORAL | Status: DC
  Administered 2011-11-10 – 2011-11-12 (×3): 10 mg via ORAL
  Filled 2011-11-09 (×3): qty 1

## 2011-11-09 MED ORDER — IPRATROPIUM BROMIDE 0.02 % IN SOLN
0.5000 mg | Freq: Once | RESPIRATORY_TRACT | Status: AC
  Administered 2011-11-09: 0.5 mg via RESPIRATORY_TRACT
  Filled 2011-11-09: qty 2.5

## 2011-11-09 MED ORDER — AZITHROMYCIN 250 MG PO TABS: 250.0000 mg | ORAL_TABLET | Freq: Every day | ORAL | Status: DC

## 2011-11-09 MED ORDER — DOCUSATE SODIUM 100 MG PO CAPS
100.0000 mg | ORAL_CAPSULE | Freq: Two times a day (BID) | ORAL | Status: DC
  Administered 2011-11-10 – 2011-11-12 (×3): 100 mg via ORAL
  Filled 2011-11-09 (×6): qty 1

## 2011-11-09 MED ORDER — ALBUTEROL SULFATE (5 MG/ML) 0.5% IN NEBU
5.0000 mg | INHALATION_SOLUTION | Freq: Once | RESPIRATORY_TRACT | Status: AC
  Administered 2011-11-09: 5 mg via RESPIRATORY_TRACT
  Filled 2011-11-09: qty 1

## 2011-11-09 MED ORDER — HEPARIN SODIUM (PORCINE) 5000 UNIT/ML IJ SOLN
5000.0000 [IU] | Freq: Three times a day (TID) | INTRAMUSCULAR | Status: DC
  Administered 2011-11-10 – 2011-11-11 (×5): 5000 [IU] via SUBCUTANEOUS
  Filled 2011-11-09 (×12): qty 1

## 2011-11-09 MED ORDER — ALBUTEROL SULFATE (5 MG/ML) 0.5% IN NEBU
5.0000 mg | INHALATION_SOLUTION | Freq: Once | RESPIRATORY_TRACT | Status: AC
  Administered 2011-11-09: 5 mg via RESPIRATORY_TRACT

## 2011-11-09 MED ORDER — DEXTROSE 50 % IV SOLN: 25.0000 g | Freq: Once | INTRAVENOUS | Status: DC

## 2011-11-09 MED ORDER — IPRATROPIUM BROMIDE 0.02 % IN SOLN
0.5000 mg | Freq: Once | RESPIRATORY_TRACT | Status: AC
  Administered 2011-11-09: 0.5 mg via RESPIRATORY_TRACT

## 2011-11-09 MED ORDER — ACETAMINOPHEN 650 MG RE SUPP: 650.0000 mg | Freq: Four times a day (QID) | RECTAL | Status: DC | PRN

## 2011-11-09 MED ORDER — MOXIFLOXACIN HCL IN NACL 400 MG/250ML IV SOLN
400.0000 mg | Freq: Once | INTRAVENOUS | Status: AC
  Administered 2011-11-09: 400 mg via INTRAVENOUS
  Filled 2011-11-09: qty 250

## 2011-11-09 MED ORDER — IPRATROPIUM BROMIDE 0.02 % IN SOLN
0.5000 mg | Freq: Four times a day (QID) | RESPIRATORY_TRACT | Status: DC
  Administered 2011-11-10 – 2011-11-12 (×9): 0.5 mg via RESPIRATORY_TRACT
  Filled 2011-11-09 (×9): qty 2.5

## 2011-11-09 MED ORDER — METHYLPREDNISOLONE SODIUM SUCC 125 MG IJ SOLR
125.0000 mg | Freq: Once | INTRAMUSCULAR | Status: AC
  Administered 2011-11-09: 125 mg via INTRAVENOUS
  Filled 2011-11-09: qty 2

## 2011-11-09 MED ORDER — INSULIN REGULAR HUMAN 100 UNIT/ML IJ SOLN: 10.0000 [IU] | Freq: Once | INTRAMUSCULAR | Status: DC

## 2011-11-09 MED ORDER — ACETAMINOPHEN 325 MG PO TABS: 650.0000 mg | ORAL_TABLET | Freq: Four times a day (QID) | ORAL | Status: DC | PRN

## 2011-11-09 MED ORDER — AZITHROMYCIN 500 MG PO TABS
500.0000 mg | ORAL_TABLET | Freq: Every day | ORAL | Status: DC
  Filled 2011-11-09: qty 1

## 2011-11-09 MED ORDER — LEVOTHYROXINE SODIUM 50 MCG PO TABS
50.0000 ug | ORAL_TABLET | Freq: Every day | ORAL | Status: DC
  Administered 2011-11-10 – 2011-11-12 (×3): 50 ug via ORAL
  Filled 2011-11-09 (×3): qty 1

## 2011-11-09 MED ORDER — ALBUTEROL SULFATE (5 MG/ML) 0.5% IN NEBU
INHALATION_SOLUTION | RESPIRATORY_TRACT | Status: AC
  Filled 2011-11-09: qty 1

## 2011-11-09 MED ORDER — ASPIRIN 81 MG PO TBEC
81.0000 mg | DELAYED_RELEASE_TABLET | Freq: Every day | ORAL | Status: DC
  Administered 2011-11-10 – 2011-11-12 (×3): 81 mg via ORAL
  Filled 2011-11-09 (×3): qty 1

## 2011-11-09 MED ORDER — GUAIFENESIN ER 600 MG PO TB12
600.0000 mg | ORAL_TABLET | Freq: Two times a day (BID) | ORAL | Status: DC
  Administered 2011-11-10 – 2011-11-12 (×5): 600 mg via ORAL
  Filled 2011-11-09 (×8): qty 1

## 2011-11-09 MED ORDER — ALBUTEROL SULFATE (5 MG/ML) 0.5% IN NEBU
2.5000 mg | INHALATION_SOLUTION | Freq: Once | RESPIRATORY_TRACT | Status: AC
  Administered 2011-11-09: 2.5 mg via RESPIRATORY_TRACT
  Filled 2011-11-09: qty 0.5

## 2011-11-09 MED ORDER — ALPRAZOLAM 0.25 MG PO TABS: 0.2500 mg | ORAL_TABLET | Freq: Two times a day (BID) | ORAL | Status: DC | PRN

## 2011-11-09 MED ORDER — SODIUM CHLORIDE 0.9 % IV SOLN
INTRAVENOUS | Status: DC
  Administered 2011-11-09: 20 mL/h via INTRAVENOUS

## 2011-11-09 MED ORDER — ALBUTEROL (5 MG/ML) CONTINUOUS INHALATION SOLN: 10.0000 mg/h | INHALATION_SOLUTION | Freq: Once | RESPIRATORY_TRACT | Status: DC

## 2011-11-09 MED ORDER — ASPIRIN 325 MG PO TABS
325.0000 mg | ORAL_TABLET | Freq: Every day | ORAL | Status: DC
  Administered 2011-11-09: 325 mg via ORAL
  Filled 2011-11-09: qty 1

## 2011-11-09 MED ORDER — ALBUTEROL SULFATE (5 MG/ML) 0.5% IN NEBU
2.5000 mg | INHALATION_SOLUTION | Freq: Four times a day (QID) | RESPIRATORY_TRACT | Status: DC
  Administered 2011-11-10 – 2011-11-12 (×9): 2.5 mg via RESPIRATORY_TRACT
  Filled 2011-11-09 (×9): qty 0.5

## 2011-11-09 MED ORDER — ONDANSETRON HCL 4 MG PO TABS: 4.0000 mg | ORAL_TABLET | Freq: Four times a day (QID) | ORAL | Status: DC | PRN

## 2011-11-09 NOTE — ED Notes (Signed)
Pt placed on oxygen on arrival to room 3 - started at 2L spo2 remained 88% - increased 3L 92%

## 2011-11-09 NOTE — ED Notes (Signed)
Pt with c/o cough and congestion x two weeks - now with shortness of breath - pain back with coughing history asthma using inhaler without relief

## 2011-11-09 NOTE — ED Provider Notes (Signed)
History     CSN: [id number]  Arrival date & time 11/09/11  1612   First MD Initiated Contact with Patient 11/09/11 1626      Chief Complaint  Patient presents with  . Shortness of Breath    (Consider location/radiation/quality/duration/timing/severity/associated sxs/prior treatment) Patient is a 66 y.o. female presenting with shortness of breath. The history is provided by the patient.  Shortness of Breath  Episode onset: about 1 week ago. The onset was gradual. The problem occurs continuously. The problem has been gradually worsening. The problem is moderate. Relieved by: a little better with albuterol. The symptoms are aggravated by activity. Associated symptoms include rhinorrhea, sore throat, cough (with clear sputum production), shortness of breath and wheezing. Pertinent negatives include no chest pain, no chest pressure, no orthopnea, no fever and no stridor. Her past medical history is significant for asthma and past wheezing. She has been behaving normally. Urine output has been normal. There were no sick contacts. Recent Medical Care: seen in urgent care today and sent to the ED for evaluation. Services received include medications given (was given nebulizer).    Past Medical History  Diagnosis Date  . ALLERGIC RHINITIS 05/01/2007  . ASTHMA 05/01/2007  . BACK PAIN 10/10/2008  . DEPRESSION 07/21/2008  . FATIGUE 10/10/2010  . HYPERTENSION 05/01/2007  . HYPERTHYROIDISM 11/23/2007  . HYPOTHYROIDISM 08/23/2009  . LUMBAR RADICULOPATHY, RIGHT 08/25/2008  . OSTEOPOROSIS 05/01/2007  . SHOULDER PAIN, LEFT 08/23/2009  . SINUSITIS, CHRONIC 10/10/2010  . SINUSITIS- ACUTE-NOS 10/10/2008    Past Surgical History  Procedure Date  . Appendectomy   . Ectopic pregnancy surgery   . Post fallopian tube removal right side   . History of multiple dialations and curettages and miscarriages, unfortunately never carrying a child to term   . Electrocardiogram 06/20/2006    Family History  Problem  Relation Age of Onset  . Alcohol abuse Brother   . Diabetes Brother   . Hypertension Other     History  Substance Use Topics  . Smoking status: Current Everyday Smoker  . Smokeless tobacco: Not on file  . Alcohol Use: Yes    OB History    Grav Para Term Preterm Abortions TAB SAB Ect Mult Living                  Review of Systems  Constitutional: Negative for fever, chills, activity change, appetite change and fatigue.  HENT: Positive for sore throat and rhinorrhea. Negative for congestion, neck pain and neck stiffness.   Eyes: Negative for photophobia, redness and visual disturbance.  Respiratory: Positive for cough (with clear sputum production), chest tightness (all around her chest ), shortness of breath and wheezing. Negative for stridor.   Cardiovascular: Negative for chest pain, palpitations, orthopnea and leg swelling.  Gastrointestinal: Negative for nausea, vomiting, abdominal pain, diarrhea, constipation and blood in stool.  Genitourinary: Negative for dysuria, urgency, hematuria and flank pain.  Musculoskeletal: Negative for back pain.  Skin: Negative for rash and wound.  Neurological: Negative for dizziness, seizures, facial asymmetry, speech difficulty, weakness, light-headedness, numbness and headaches.  Psychiatric/Behavioral: Negative for confusion.  All other systems reviewed and are negative.    Allergies  Alendronate sodium  Home Medications   Current Outpatient Rx  Name Route Sig Dispense Refill  . ALBUTEROL SULFATE HFA 108 (90 BASE) MCG/ACT IN AERS Inhalation Inhale 2 puffs into the lungs every 6 (six) hours as needed. For shortness of breath    . ALPRAZOLAM 0.25 MG PO TABS Oral Take  0.25 mg by mouth 2 (two) times daily.    . ASPIRIN 81 MG PO TBEC Oral Take 81 mg by mouth daily.      [name] CALCIUM CARBONATE 600 MG PO TABS Oral Take 600 mg by mouth 2 (two) times daily.      . CELECOXIB 200 MG PO CAPS Oral Take 200 mg by mouth 2 (two) times daily. For  arthritis pain    . CETIRIZINE HCL 10 MG PO TABS Oral Take 10 mg by mouth daily.      [name] CITALOPRAM HYDROBROMIDE 10 MG PO TABS Oral Take 10 mg by mouth daily.    [name] FLUTICASONE-SALMETEROL 250-50 MCG/DOSE IN AEPB Inhalation Inhale 1 puff into the lungs 2 (two) times daily.      [name] LEVOTHYROXINE SODIUM 50 MCG PO TABS Oral Take 50 mcg by mouth daily.    . TRIAMTERENE-HCTZ 37.5-25 MG PO CAPS Oral Take 1 capsule by mouth daily.    . ALBUTEROL SULFATE HFA 108 (90 BASE) MCG/ACT IN AERS        BP 132/86  Pulse 87  Temp(Src) 98.2 F (36.8 C) (Oral)  Resp 18  SpO2 92%  Physical Exam  Nursing note and vitals reviewed. Constitutional: She is oriented to person, place, and time. She appears well-developed and well-nourished.  Non-toxic appearance. No distress.       BP normal. HR normal 87 bpm. Hypoxia.   HENT:  Head: Normocephalic and atraumatic.  Mouth/Throat: Oropharynx is clear and moist.  Eyes: Conjunctivae and EOM are normal. Pupils are equal, round, and reactive to light. No scleral icterus.  Neck: Normal range of motion. Neck supple. No JVD present.  Cardiovascular: Normal rate, regular rhythm, normal heart sounds and intact distal pulses.   No murmur heard. Pulmonary/Chest: Tachypnea (mild) noted. No respiratory distress. She has no decreased breath sounds. She has wheezes (moderate diffusely). She has no rales.  Abdominal: Soft. Bowel sounds are normal. She exhibits no distension. There is no tenderness. There is no rebound and no guarding.  Musculoskeletal: Normal range of motion.       Trace lower extremity edema. L>R. Patient says this is normal for her. Negative homan's.   Neurological: She is alert and oriented to person, place, and time. She has normal strength. No cranial nerve deficit. GCS eye subscore is 4. GCS verbal subscore is 5. GCS motor subscore is 6.  Skin: Skin is warm and dry. No rash noted. She is not diaphoretic.  Psychiatric: She has a normal mood and affect.    ED  Course  Procedures (including critical care time)  Labs Reviewed  CBC - Abnormal; Notable for the following:    Hemoglobin 17.3 (*)    HCT 51.3 (*)    MCV 101.0 (*)    MCH 34.1 (*)    All other components within normal limits  DIFFERENTIAL - Abnormal; Notable for the following:    Monocytes Relative 18 (*)    All other components within normal limits  BASIC METABOLIC PANEL - Abnormal; Notable for the following:    Sodium 133 (*)    Chloride 95 (*)    CO2 33 (*)    Glucose, Bld 110 (*)    GFR calc non Af Amer 84 (*)    All other components within normal limits  TROPONIN I   Dg Chest 2 View  11/09/2011  *RADIOLOGY REPORT*  Clinical Data: Shortness of breath  CHEST - 2 VIEW  Comparison: 05/10/2007  Findings: Increased bibasilar interstitial markings  and possible bronchiectasis or cystic change noted.  Trace pleural effusion may be evident bilaterally.  Heart size is mildly enlarged. Aorta is ectatic and unfolded.  No acute osseous finding.  IMPRESSION: Increased bibasilar interstitial changes with bronchiectasis or cystic change is noted.  If outside evaluation has not already been performed, outpatient nonemergent high-resolution chest CT should be considered for evaluation for possible interstitial lung disease.  This could represent acute atypical infection, as well.  Original Report Authenticated By: Harrel Lemon, M.D.   ED ECG REPORT   Date: 11/09/2011  EKG Time: 5:33 PM  Rate: 81  Rhythm: normal sinus rhythm,    Axis: right  Intervals:none  ST&T Change: Twave inversions in V1-V3.  Narrative Interpretation: No prior EKGs for comparison. Twave inversions present in anterior leads.               1. Asthma exacerbation   2. Chest tightness       MDM  66yo AAF with PMH significant for HTN hypothyroid and asthma who presents to the ED transferred from [facility] due to progressively worsening cough and dyspnea over the last week. Patient found to be hypoxic to 88% in UCC.  Given neb prior to arrival in ED. Pt placed on 2L o2 sat 91% on 2L. Pt with diffuse wheezing. Afebrile and no hx fever. Hx and exam c/w asthma exacerbation. Pt with tightness all across chest prob due to asthma but will get EKG and troponin. Giving solumedrol and alb/atrovent neb.   CXR with bibasilar changes. Will cover with avelox.   Pt still hypoxic requiring 2L to keep sats >90%. EKG with TWIs in anterior leads. Medicine consulted for admission.         Verne Carrow, MD 11/09/11 2330

## 2011-11-09 NOTE — ED Provider Notes (Signed)
6:06 PM  I performed a history and physical examination of Jasmine [name] and discussed her management with Dr. Meredith Pel.  I agree with the history, physical, assessment, and plan of care, with the following exceptions: None  The patient is awake, alert, and oriented appropriately and in no apparent distress. Her lungs on auscultation demonstrate exhalatory wheezing in all fields. She is in no overt respiratory distress. She is currently getting a nebulized breathing treatment. Her heart sounds are normal with regular rate and rhythm.  I was present for the following procedures: None Time Spent in Critical Care of the patient: None Time spent in discussions with the patient and family: 5 minutes  Evonte Prestage D    Felisa Bonier, MD 11/09/11 1807

## 2011-11-09 NOTE — ED Notes (Signed)
Patient transported to X-ray 

## 2011-11-09 NOTE — ED Notes (Signed)
Received pt from urgent care for further eval of shortness of breath and productive cough x 2 weeks. Upon arrival to Urgent care pt pulse ox reported to be 88%. Pt given neb treatment and placed on O2 2L via [place] and pulse ox increased to 93-96%. Pt also reports back pain but has history of same.

## 2011-11-09 NOTE — ED Provider Notes (Signed)
History     CSN: [id number]  Arrival date & time 11/09/11  1323   First MD Initiated Contact with Patient 11/09/11 1356      Chief Complaint  Patient presents with  . Cough  . Nasal Congestion  . Shortness of Breath  . Back Pain    (Consider location/radiation/quality/duration/timing/severity/associated sxs/prior treatment) Patient is a 66 y.o. female presenting with cough, shortness of breath, and back pain. The history is provided by the patient.  Cough This is a recurrent problem. The current episode started more than 1 week ago. The problem has been gradually worsening (feels worst ever, can't get enough air to breathe in the advair.). The cough is productive of sputum. Associated symptoms include rhinorrhea, shortness of breath and wheezing. Her past medical history is significant for emphysema and asthma.  Shortness of Breath  Associated symptoms include rhinorrhea, cough, shortness of breath and wheezing. Her past medical history is significant for asthma.  Back Pain     Past Medical History  Diagnosis Date  . ALLERGIC RHINITIS 05/01/2007  . ASTHMA 05/01/2007  . BACK PAIN 10/10/2008  . DEPRESSION 07/21/2008  . FATIGUE 10/10/2010  . HYPERTENSION 05/01/2007  . HYPERTHYROIDISM 11/23/2007  . HYPOTHYROIDISM 08/23/2009  . LUMBAR RADICULOPATHY, RIGHT 08/25/2008  . OSTEOPOROSIS 05/01/2007  . SHOULDER PAIN, LEFT 08/23/2009  . SINUSITIS, CHRONIC 10/10/2010  . SINUSITIS- ACUTE-NOS 10/10/2008    Past Surgical History  Procedure Date  . Appendectomy   . Ectopic pregnancy surgery   . Post fallopian tube removal right side   . History of multiple dialations and curettages and miscarriages, unfortunately never carrying a child to term   . Electrocardiogram 06/20/2006    Family History  Problem Relation Age of Onset  . Alcohol abuse Brother   . Diabetes Brother   . Hypertension Other     History  Substance Use Topics  . Smoking status: Current Everyday Smoker  . Smokeless  tobacco: Not on file  . Alcohol Use: Yes    OB History    Grav Para Term Preterm Abortions TAB SAB Ect Mult Living                  Review of Systems  Constitutional: Negative.   HENT: Positive for congestion and rhinorrhea.   Respiratory: Positive for cough, shortness of breath and wheezing.   Cardiovascular: Positive for leg swelling.  Musculoskeletal: Positive for back pain.    Allergies  Alendronate sodium  Home Medications   Current Outpatient Rx  Name Route Sig Dispense Refill  . ALPRAZOLAM 0.25 MG PO TABS Oral Take 1 tablet (0.25 mg total) by mouth 2 (two) times daily. 60 tablet 2  . ASPIRIN 81 MG PO TBEC Oral Take 81 mg by mouth daily.      [name] CALCIUM CARBONATE 600 MG PO TABS Oral Take 600 mg by mouth 2 (two) times daily.      . CELECOXIB 200 MG PO CAPS Oral Take 1 capsule (200 mg total) by mouth 2 (two) times daily. As needed 60 capsule 2  . CETIRIZINE HCL 10 MG PO TABS Oral Take 10 mg by mouth daily.      [name] CITALOPRAM HYDROBROMIDE 10 MG PO TABS  TAKE 1 TABLET EVERY DAY 90 tablet 3  . LEVOTHYROXINE SODIUM 50 MCG PO TABS  TAKE 1 TABLET EVERY DAY 90 tablet 1  . TUSSIN CF PO Oral Take by mouth.    [name] PROAIR HFA 108 (90 BASE) MCG/ACT IN AERS  TAKE 2  PUFFS 4 TIMES A DAY AS NEEDED 8.5 Inhaler 4  . TRIAMTERENE-HCTZ 37.5-25 MG PO CAPS Oral Take 1 each (1 capsule total) by mouth daily. 90 capsule 2  . FLUTICASONE PROPIONATE 50 MCG/ACT NA SUSP Nasal Place 2 sprays into the nose daily.      [name] FLUTICASONE-SALMETEROL 250-50 MCG/DOSE IN AEPB Inhalation Inhale 1 puff into the lungs 2 (two) times daily.      [name] RISEDRONATE SODIUM 150 MG PO TABS Oral Take 150 mg by mouth every 30 (thirty) days. with water on empty stomach, nothing by mouth or lie down for next 30 minutes.       BP 145/91  Pulse 108  Temp(Src) 98.6 F (37 C) (Oral)  Resp 21  SpO2 93%  Physical Exam  Nursing note and vitals reviewed. Constitutional: She is oriented to person, place, and time. She appears  well-developed and well-nourished.  HENT:  Head: Normocephalic.  Right Ear: External ear normal.  Left Ear: External ear normal.  Mouth/Throat: Oropharynx is clear and moist.  Eyes: Pupils are equal, round, and reactive to light.  Neck: Normal range of motion. Neck supple.  Cardiovascular: Regular rhythm, normal heart sounds, intact distal pulses and normal pulses.  Tachycardia present.   Pulmonary/Chest: She is in respiratory distress. She has decreased breath sounds. She has wheezes.  Musculoskeletal: She exhibits edema.  Lymphadenopathy:    She has no cervical adenopathy.  Neurological: She is alert and oriented to person, place, and time.  Skin: Skin is warm and dry.  Psychiatric: She has a normal mood and affect.    ED Course  Procedures (including critical care time)  Labs Reviewed - No data to display Dg Chest 2 View  11/09/2011  *RADIOLOGY REPORT*  Clinical Data: Shortness of breath  CHEST - 2 VIEW  Comparison: 05/10/2007  Findings: Increased bibasilar interstitial markings and possible bronchiectasis or cystic change noted.  Trace pleural effusion may be evident bilaterally.  Heart size is mildly enlarged. Aorta is ectatic and unfolded.  No acute osseous finding.  IMPRESSION: Increased bibasilar interstitial changes with bronchiectasis or cystic change is noted.  If outside evaluation has not already been performed, outpatient nonemergent high-resolution chest CT should be considered for evaluation for possible interstitial lung disease.  This could represent acute atypical infection, as well.  Original Report Authenticated By: Harrel Lemon, M.D.     1. Shortness of breath dyspnea       MDM  X-rays reviewed and report per radiologist.         Barkley Bruns, MD 11/09/11 1501

## 2011-11-09 NOTE — ED Notes (Signed)
Spoke with floor staff in regards to dirty bed assignment - states will now assign bed

## 2011-11-09 NOTE — H&P (Signed)
PCP:   Oliver Barre, MD, MD  Romero Belling follows thyroid, per pt  Chief Complaint:  "Congestion"  HPI: 66yoF with h/o asthma, ? Graves disease, and polysubstance abuse  presents with cough/congestion and found to be hypoxic, with abnormal  CXR.   Pt states she developed chest congestion about 3 wks ago consisting of  severe, junky cough productive of thick clearish, slightly yellow  phlegm. Cough has been inhibiting good sleep. She does not endorse  systemic illness though, no fevers, chills, sweats, although she feels  a bit run down. She feels a little SOB but no respiratory distress,  and small amt of decreased appetite. No myalgias or other flu-like  symptoms.   She went to see urgent care where she was noted to be satting in the  high 80's on RA, was given one neb and sent to [facility] ED.    In the ED, she was noted to require 2L of [place] to maintain sats >90%;  also with minimal tachycardia to 108. Labs significant for Na 133, Cl  95, HCO3 of 33, renal 9/0.79. Trop negative x1. Hct was 51 with MCV  101. CXR showed increased bibasilar interstitial changes with  bronchiectasis or cystic change, recommend outpt nonemergent high- resolution chest CT for evaluation of possible interstitial lung  disease, vs atypical infxn. Pt was given nebs, 125 mg solumedrol,  Avelox. Finally, ECG noted to have anterior TW inversions, but  negative Trop, but given ASA 325.   ROS as above otherwise negative for dizziness, lightheadedness, chest  pain, chest pressure, or other cardiac complaints. No GI issues of  n/v/d/abd pain, no dysuria, no rash. ROS o/w negative.  Past Medical History  Diagnosis Date  . ALLERGIC RHINITIS 05/01/2007  . ASTHMA 05/01/2007  . BACK PAIN 10/10/2008  . DEPRESSION 07/21/2008  . FATIGUE 10/10/2010  . HYPERTENSION 05/01/2007  . HYPERTHYROIDISM 11/23/2007    Pt endorses having had Graves disease, possibly radioactive iodine x1, but no thyroidectomy  . HYPOTHYROIDISM  08/23/2009  . LUMBAR RADICULOPATHY, RIGHT 08/25/2008  . OSTEOPOROSIS 05/01/2007  . SHOULDER PAIN, LEFT 08/23/2009  . SINUSITIS, CHRONIC 10/10/2010  . SINUSITIS- ACUTE-NOS 10/10/2008    Past Surgical History  Procedure Date  . Appendectomy   . Ectopic pregnancy surgery   . Post fallopian tube removal right side   . History of multiple dialations and curettages and miscarriages, unfortunately never carrying a child to term   . Electrocardiogram 06/20/2006   Medications:  HOME MEDS: Reconciled fairly well with pt Prior to Admission medications   Medication Sig Start Date End Date Taking? Authorizing Provider  albuterol (PROVENTIL HFA;VENTOLIN HFA) 108 (90 BASE) MCG/ACT inhaler Inhale 2 puffs into the lungs every 6 (six) hours as needed. For shortness of breath   Yes Historical Provider, MD  ALPRAZolam (XANAX) 0.25 MG tablet Take 0.25 mg by mouth 2 (two) times daily as needed.  04/14/11  Yes Oliver Barre, MD  aspirin 81 MG EC tablet Take 81 mg by mouth daily.     Yes Historical Provider, MD  calcium carbonate (OS-CAL) 600 MG TABS Take 600 mg by mouth 2 (two) times daily.     Yes Historical Provider, MD  celecoxib (CELEBREX) 200 MG capsule Take 200 mg by mouth 2 (two) times daily as needed. For arthritis pain 08/11/11 08/10/12 Yes Oliver Barre, MD  cetirizine (ZYRTEC) 10 MG tablet Take 10 mg by mouth daily.     Yes Historical Provider, MD  citalopram (CELEXA) 10 MG tablet Take 10  mg by mouth daily.   Yes Historical Provider, MD  Fluticasone-Salmeterol (ADVAIR DISKUS) 250-50 MCG/DOSE AEPB Inhale 1 puff into the lungs 2 (two) times daily.     Yes Historical Provider, MD  levothyroxine (SYNTHROID, LEVOTHROID) 50 MCG tablet Take 50 mcg by mouth daily.   Yes Historical Provider, MD  triamterene-hydrochlorothiazide (DYAZIDE) 37.5-25 MG per capsule Take 1 capsule by mouth daily. 11/03/11  Yes Oliver Barre, MD  albuterol [facility] HFA) 108 [phone] BASE) MCG/ACT inhaler  10/20/11 11/09/11  Oliver Barre, MD     Allergies:  Allergies  Allergen Reactions  . Alendronate Sodium Other (See Comments)    unknown    Social History:   reports that she has been smoking Cigarettes.  She has a 30 pack-year smoking history. She does not have any smokeless tobacco history on file. She reports that she drinks alcohol. She reports that she uses illicit drugs.  Lives in [place], [place]. Lives with husband, no children, but husband has 3 kids. Ambulatory, without cane or walker. Smokes cigarettes, currently 1 ppd, smoked for >20-30 yrs. Drinks beer, ~2/day, occasionally liquor, but ? suspect minimizing. Occasional MJ and endorses crack cocaine occasionally  Family History: Family History  Problem Relation Age of Onset  . Alcohol abuse Brother   . Diabetes Brother   . Hypertension Other   . Lung cancer Father   . Stroke Mother     Physical Exam: Filed Vitals:   11/09/11 1615 11/09/11 1710 11/09/11 1844 11/09/11 2113  BP: 132/86 121/78 143/63 119/69  Pulse: 87 86 83   Temp: 98.2 F (36.8 C)     TempSrc: Oral     Resp: 18 22 20    SpO2: 92% 93% 96% 99%   Blood pressure 119/69, pulse 83, temperature 98.2 F (36.8 C), temperature source Oral, resp. rate 20, SpO2 99.00%.  Gen: Middle aged F in ED stretcher, awoken to voice with a start but  otherwise no distress, well appearing, non toxic, not ill appearing.  Can relate history and is pleasant, speaking full sentences, no  respiratory distress HEENT: Arcus senilis, scleral muddying, but pupils round, reactive.  Mouth/tongue dryish appearing.  Neck: Thin. Left external jugular is grossly distended without  Kussmaul's sign.  Lungs: Tight sounding with diffuse expiratory high pitched squeaking  and wheezing, poor to fair air movement Heart: Regular, not tachycardic, with normal S1/2, no RV heave, no  murmurs appreciated, no gallops Abd: Scaphoid, not obese, NT ND, benign overall Extrem: Thin, normal tone, warm and perfusing normally, bilateral   radials palpated, no BLE edema.  Neuro: Alert, attentive, CN2-12 intact, moves extremities  spontaneously, grossly non-focal   Labs & Imaging Results for orders placed during the hospital encounter of 11/09/11 (from the past 48 hour(s))  CBC     Status: Abnormal   Collection Time   11/09/11  4:39 PM      Component Value Range Comment   WBC 4.8  4.0 - 10.5 (K/uL)    RBC 5.08  3.87 - 5.11 (MIL/uL)    Hemoglobin 17.3 (*) 12.0 - 15.0 (g/dL)    HCT 10.9 (*) 60.4 - 46.0 (%)    MCV 101.0 (*) 78.0 - 100.0 (fL)    MCH 34.1 (*) 26.0 - 34.0 (pg)    MCHC 33.7  30.0 - 36.0 (g/dL)    RDW 54.0  98.1 - 19.1 (%)    Platelets 151  150 - 400 (K/uL)   DIFFERENTIAL     Status: Abnormal   Collection Time  11/09/11  4:39 PM      Component Value Range Comment   Neutrophils Relative 52  43 - 77 (%)    Neutro Abs 2.5  1.7 - 7.7 (K/uL)    Lymphocytes Relative 29  12 - 46 (%)    Lymphs Abs 1.4  0.7 - 4.0 (K/uL)    Monocytes Relative 18 (*) 3 - 12 (%)    Monocytes Absolute 0.9  0.1 - 1.0 (K/uL)    Eosinophils Relative 0  0 - 5 (%)    Eosinophils Absolute 0.0  0.0 - 0.7 (K/uL)    Basophils Relative 1  0 - 1 (%)    Basophils Absolute 0.0  0.0 - 0.1 (K/uL)   BASIC METABOLIC PANEL     Status: Abnormal   Collection Time   11/09/11  4:39 PM      Component Value Range Comment   Sodium 133 (*) 135 - 145 (mEq/L)    Potassium 4.5  3.5 - 5.1 (mEq/L)    Chloride 95 (*) 96 - 112 (mEq/L)    CO2 33 (*) 19 - 32 (mEq/L)    Glucose, Bld 110 (*) 70 - 99 (mg/dL)    BUN 9  6 - 23 (mg/dL)    Creatinine, Ser 1.61  0.50 - 1.10 (mg/dL)    Calcium 9.0  8.4 - 10.5 (mg/dL)    GFR calc non Af Amer 84 (*) >90 (mL/min)    GFR calc Af Amer >90  >90 (mL/min)   TROPONIN I     Status: Normal   Collection Time   11/09/11  4:39 PM      Component Value Range Comment   Troponin I <0.30  <0.30 (ng/mL)    Dg Chest 2 View  11/09/2011  *RADIOLOGY REPORT*  Clinical Data: Shortness of breath.  CHEST - 2 VIEW  Comparison: PA and lateral  chest 11/09/2011 at 3:14 p.m. and 05/10/2007.  Findings: Lungs are emphysematous.  Small bilateral pleural effusions and prominence of the pulmonary interstitium, particularly in the bases, are again seen.  Basilar interstitial opacities appear improved.  No pneumothorax.  IMPRESSION: Some improvement in basilar interstitial opacities which may be due to decreased atelectasis or edema.  No new abnormality.  Emphysema again noted.  Original Report Authenticated By: Bernadene Bell. Maricela Curet, M.D.   Dg Chest 2 View  11/09/2011  *RADIOLOGY REPORT*  Clinical Data: Shortness of breath  CHEST - 2 VIEW  Comparison: 05/10/2007  Findings: Increased bibasilar interstitial markings and possible bronchiectasis or cystic change noted.  Trace pleural effusion may be evident bilaterally.  Heart size is mildly enlarged. Aorta is ectatic and unfolded.  No acute osseous finding.  IMPRESSION: Increased bibasilar interstitial changes with bronchiectasis or cystic change is noted.  If outside evaluation has not already been performed, outpatient nonemergent high-resolution chest CT should be considered for evaluation for possible interstitial lung disease.  This could represent acute atypical infection, as well.  Original Report Authenticated By: Harrel Lemon, M.D.   ECG: NSR 81 bpm, RAD, prominent P waves and P-pulmonale, no Q waves,  narrow QRS, 0.35mm STE in V2-3 with inverted TW and some flattening of  TW in high lateral leads, otherwise ST segment and TW normal. TWI was not seen in prior of 2010.  Impression Present on Admission:  .Bronchitis .Abnormal CXR .Hypoxia .Hyponatremia .Macrocytosis without anemia  66yoF with h/o asthma, ? Graves disease, and polysubstance abuse  presents with cough/congestion and found to be hypoxic, with abnormal  CXR; also  found to have abnormal EKG.  1. Cough/congestion/hypoxia: DDx includes atypical/"walking" PNA vs  bronchitis. No reported fevers, leukocytosis, or overwhelming  malaise  to suggest lobar PNA, influenza. However, does have hypoxia, which  could be acute or chronic given CXR findings (see below), and lung  exam is tight and wheezy. Suspect she may also have undiagnosed COPD.   - S/p Avelox x1, will switch to Azithromycin given atypical PNA seems  more likely than CAP.  - Nebulizers, will downtitrate to moderate dose steroids (40 mg PO  Prednisone). Keep on nebulizers. PT consult with ambulatory sats  - Consider outpt PFT's on d/c.    2. Abnormal CXR: Called as ILD vs atypical infection. Clinical history  overall sounds more like the latter, however pt has P-pulmonale on EKG  to suggest RA enlargement, has prominent external jugular veins to  suggest elevated R sided/pulmonary pressures, and has h/o longstanding  cigarettes and crack smoking, so more serious pulmonary pathology not  unreasonable.   The plan I outlined to the patient was to treat for atypical PNA as  above, and repeat CXR in 1-2 months when current symptoms have abated.  If findings resolved then radiographically was atypical infxn, but if  persistent needs CT chest. Would ensure f/u and conveying this plan to  PCP.   3. Abnormal ECG: She has no cardiac complaints and Trop negative x1,  therefore anterior TWI at this point are non-specific.  - Continue ASA 81 mg daily, repeat ECG in the am, and repeat Trop x1  with am labs for good measure.   4. HypoNa/hypoCl: Possibly pre-renal vs Dyazide effect.  - Holding dyazide, give IVF's and trend BMET  5. Macrocytosis without anemia: Pt is not actually anemic, her Hct of  51 is a bit higher than the baseline in the mid to high 40's going  back to 2008, which could possibly be some hemoconcentration. The  macrocytosis is old though, going back to 2009, so not acute issue.  Will get folate and B12 x1 with am labs.   Telemetry, [facility] team 4 Full code, discussed with pt   Other plans as per orders.  Stepfanie Yott 11/09/2011, 9:30  PM

## 2011-11-10 ENCOUNTER — Encounter (HOSPITAL_COMMUNITY): Payer: Self-pay | Admitting: *Deleted

## 2011-11-10 ENCOUNTER — Other Ambulatory Visit: Payer: Self-pay

## 2011-11-10 LAB — VITAMIN B12: Vitamin B-12: 767 pg/mL (ref 211–911)

## 2011-11-10 LAB — BASIC METABOLIC PANEL
BUN: 12 mg/dL (ref 6–23)
Creatinine, Ser: 0.73 mg/dL (ref 0.50–1.10)
GFR calc Af Amer: >90 mL/min (ref >90)
GFR calc non Af Amer: 86 mL/min — ABNORMAL LOW (ref >90)

## 2011-11-10 LAB — CBC
HCT: 49.8 % — ABNORMAL HIGH (ref 36.0–46.0)
MCH: 33.5 pg (ref 26.0–34.0)
MCHC: 33.1 g/dL (ref 30.0–36.0)
RDW: 14.9 % (ref 11.5–15.5)

## 2011-11-10 MED ORDER — METHYLPREDNISOLONE SODIUM SUCC 125 MG IJ SOLR
60.0000 mg | Freq: Two times a day (BID) | INTRAMUSCULAR | Status: DC
  Administered 2011-11-10 – 2011-11-11 (×3): 60 mg via INTRAVENOUS
  Filled 2011-11-10: qty 0.96
  Filled 2011-11-10: qty 2
  Filled 2011-11-10 (×2): qty 0.96

## 2011-11-10 MED ORDER — SODIUM POLYSTYRENE SULFONATE 15 GM/60ML PO SUSP
30.0000 g | Freq: Once | ORAL | Status: AC
  Administered 2011-11-10: 30 g via ORAL
  Filled 2011-11-10: qty 120

## 2011-11-10 MED ORDER — MOXIFLOXACIN HCL 400 MG PO TABS
400.0000 mg | ORAL_TABLET | Freq: Every day | ORAL | Status: DC
  Administered 2011-11-10 – 2011-11-11 (×2): 400 mg via ORAL
  Filled 2011-11-10 (×3): qty 1

## 2011-11-10 NOTE — Progress Notes (Signed)
Utilization review completed.  

## 2011-11-10 NOTE — Progress Notes (Signed)
PATIENT DETAILS Name: Jasmine [name] Age: 66 y.o. Sex: female Date of Birth: 1946/07/23 Admit Date: 11/09/2011 [id number] John, MD, MD  Subjective: Already better, not able to ambulate to the bathroom without much difficulty  Objective: Vital signs in last 24 hours: Filed Vitals:   11/10/11 0525 11/10/11 0613 11/10/11 0754 11/10/11 1050  BP: 109/74 109/74    Pulse: 90 90    Temp: 97.7 F (36.5 C) 97.7 F (36.5 C)    TempSrc: Oral Oral    Resp: 20 20    Height:      Weight:      SpO2: 95% 95% 89% 85%    Weight change:   Body mass index is 22.24 kg/(m^2).  Intake/Output from previous day:  Intake/Output Summary (Last 24 hours) at 11/10/11 1411 Last data filed at 11/10/11 1300  Gross per 24 hour  Intake    720 ml  Output      0 ml  Net    720 ml    PHYSICAL EXAM: Gen Exam: Awake and alert with clear speech.   Neck: Supple, No JVD.   Chest: B/L good air entry with prolonged expiration, rhonchi heard all over CVS: S1 S2 Regular, no murmurs.  Abdomen: soft, BS +, non tender, non distended.  Extremities: no edema, lower extremities warm to touch. Neurologic: Non Focal.   Skin: No Rash.  Wounds: N/A.    CONSULTS:  none  LAB RESULTS: CBC  Lab 11/10/11 0600 11/09/11 1639  WBC 3.3* 4.8  HGB 16.5* 17.3*  HCT 49.8* 51.3*  PLT 158 151  MCV 101.0* 101.0*  MCH 33.5 34.1*  MCHC 33.1 33.7  RDW 14.9 15.0  LYMPHSABS -- 1.4  MONOABS -- 0.9  EOSABS -- 0.0  BASOSABS -- 0.0  BANDABS -- --    Chemistries   Lab 11/10/11 0600 11/09/11 1639  NA 139 133*  K 5.2* 4.5  CL 101 95*  CO2 33* 33*  GLUCOSE 125* 110*  BUN 12 9  CREATININE 0.73 0.79  CALCIUM 8.7 9.0  MG -- --    GFR Estimated Creatinine Clearance: 49 ml/min (by C-G formula based on Cr of 0.73).  Coagulation profile No results found for this basename: INR:5,PROTIME:5 in the last 168 hours  Cardiac Enzymes  Lab 11/10/11 0600 11/09/11 1639  CKMB -- --  TROPONINI <0.30 <0.30  MYOGLOBIN -- --     No components found with this basename: POCBNP:3 No results found for this basename: DDIMER:2 in the last 72 hours No results found for this basename: HGBA1C:2 in the last 72 hours No results found for this basename: CHOL:2,HDL:2,LDLCALC:2,TRIG:2,CHOLHDL:2,LDLDIRECT:2 in the last 72 hours No results found for this basename: TSH,T4TOTAL,FREET3,T3FREE,THYROIDAB in the last 72 hours  Basename 11/10/11 0600  VITAMINB12 767  FOLATE --  FERRITIN --  TIBC --  IRON --  RETICCTPCT --   No results found for this basename: LIPASE:2,AMYLASE:2 in the last 72 hours  Urine Studies No results found for this basename: UACOL:2,UAPR:2,USPG:2,UPH:2,UTP:2,UGL:2,UKET:2,UBIL:2,UHGB:2,UNIT:2,UROB:2,ULEU:2,UEPI:2,UWBC:2,URBC:2,UBAC:2,CAST:2,CRYS:2,UCOM:2,BILUA:2 in the last 72 hours  MICROBIOLOGY: No results found for this or any previous visit (from the past 240 hour(s)).  RADIOLOGY STUDIES/RESULTS: Dg Chest 2 View  11/09/2011  *RADIOLOGY REPORT*  Clinical Data: Shortness of breath.  CHEST - 2 VIEW  Comparison: PA and lateral chest 11/09/2011 at 3:14 p.m. and 05/10/2007.  Findings: Lungs are emphysematous.  Small bilateral pleural effusions and prominence of the pulmonary interstitium, particularly in the bases, are again seen.  Basilar interstitial opacities appear improved.  No pneumothorax.  IMPRESSION: Some improvement in basilar interstitial opacities which may be due to decreased atelectasis or edema.  No new abnormality.  Emphysema again noted.  Original Report Authenticated By: Bernadene Bell. Maricela Curet, M.D.   Dg Chest 2 View  11/09/2011  *RADIOLOGY REPORT*  Clinical Data: Shortness of breath  CHEST - 2 VIEW  Comparison: 05/10/2007  Findings: Increased bibasilar interstitial markings and possible bronchiectasis or cystic change noted.  Trace pleural effusion may be evident bilaterally.  Heart size is mildly enlarged. Aorta is ectatic and unfolded.  No acute osseous finding.  IMPRESSION: Increased  bibasilar interstitial changes with bronchiectasis or cystic change is noted.  If outside evaluation has not already been performed, outpatient nonemergent high-resolution chest CT should be considered for evaluation for possible interstitial lung disease.  This could represent acute atypical infection, as well.  Original Report Authenticated By: Harrel Lemon, M.D.    MEDICATIONS: Scheduled Meds:   . sodium chloride   Intravenous Once  . albuterol  2.5 mg Nebulization Once  . albuterol  2.5 mg Nebulization Q6H  . albuterol  5 mg Nebulization Once  . aspirin  81 mg Oral Daily  . citalopram  10 mg Oral Daily  . docusate sodium  100 mg Oral BID  . guaiFENesin  600 mg Oral BID  . heparin  5,000 Units Subcutaneous Q8H  . ipratropium  0.5 mg Nebulization Once  . ipratropium  0.5 mg Nebulization Q6H  . levothyroxine  50 mcg Oral Daily  . methylPREDNISolone (SOLU-MEDROL) injection  125 mg Intravenous Once  . methylPREDNISolone (SOLU-MEDROL) injection  60 mg Intravenous Q12H  . moxifloxacin  400 mg Intravenous Once  . moxifloxacin  400 mg Oral q1800  . senna  1 tablet Oral BID  . sodium polystyrene  30 g Oral Once  . DISCONTD: albuterol  10 mg/hr Nebulization Once  . DISCONTD: aspirin  325 mg Oral Daily  . DISCONTD: azithromycin  250 mg Oral Daily  . DISCONTD: azithromycin  500 mg Oral Daily  . DISCONTD: dextrose  25 g Intravenous Once  . DISCONTD: insulin regular  10 Units Intravenous Once  . DISCONTD: predniSONE  10 mg Oral Q breakfast   Continuous Infusions:  PRN Meds:.acetaminophen, acetaminophen, albuterol, ALPRAZolam, ondansetron (ZOFRAN) IV, ondansetron  Antibiotics: Anti-infectives     Start     Dose/Rate Route Frequency Ordered Stop   11/11/11 1000   azithromycin (ZITHROMAX) tablet 250 mg  Status:  Discontinued        250 mg Oral Daily 11/09/11 2234 11/10/11 0848   11/10/11 1800   moxifloxacin (AVELOX) tablet 400 mg        400 mg Oral Daily-1800 11/10/11 0848      11/10/11 1000   azithromycin (ZITHROMAX) tablet 500 mg  Status:  Discontinued        500 mg Oral Daily 11/09/11 2234 11/10/11 0848   11/09/11 1645   moxifloxacin (AVELOX) IVPB 400 mg        400 mg 250 mL/hr over 60 Minutes Intravenous  Once 11/09/11 1640 11/09/11 1814          Assessment/Plan: Patient Active Hospital Problem List:  acute exacerbation of COPD  - change prednisone to Solu-Medrol, continue with Avelox. Continue with nebulized Atrovent and albuterol.  -We need to check for home oxygen requirement on discharge  Hypoxic respiratory failure  -Secondary to above  -Currently stable with 2 L of oxygen via nasal cannula  -Symptomatically/clinically much better  -We'll continue to monitor clinical response to  treatment.   Abnormal CXR  ? Fibrosis-plan to order a CT of the chest when more stable  Hyponatremia -Likely secondary to diuretics -Resolved with hydration  Mild hyperkalemia -Kayexalate  Hypothyroidism -Continue with levothyroxine -For the TSH monitoring as an outpatient  Anxiety/depression -Stable, continue with Celexa -As needed Xanax  Hypertension -Currently controlled without the need for any antihypertensives -We'll continue to monitor BP and restart antihypertensive if need be.  Abnormal EKG -Recheck 2-D echocardiogram for signs of pulmonary hypertension  Tobacco abuse -Have counseled extensively.  Disposition: -Remain inpatient  DVT Prophylaxis: Subcutaneous heparin  Code Status: Full code  Maretta Bees,  MD. 11/10/2011, 2:11 PM

## 2011-11-10 NOTE — Clinical Documentation Improvement (Signed)
PNEUMONIA DOCUMENTATION CLARIFICATION QUERY  THIS DOCUMENT IS NOT A PERMANENT PART OF THE MEDICAL RECORD  TO RESPOND TO THE THIS QUERY, FOLLOW THE INSTRUCTIONS BELOW:  1. If needed, update documentation for the patient's encounter via the notes activity.  2. Access this query again and click edit on the [employer].  3. After updating, or not, click F2 to complete all highlighted (required) fields concerning your review. Select "additional documentation in the medical record" OR "no additional documentation provided".  4. Click Sign note button.  5. The deficiency will fall out of your InBasket *Please let us know if you are not able to complete this workflow by phone or e-mail (listed below).  Please update your documentation within the medical record to reflect your response to this query.                                                                                    11/10/11  Dear Dr.Ghimire / Associates  In a better effort to capture your patient's severity of illness, reflect appropriate length of stay and utilization of resources, a review of the patient medical record has revealed the following indicators.    Based on your clinical judgment, please clarify and document in a progress note and/or discharge summary the clinical condition associated with the following supporting information:   Possible Clinical Conditions?   _______Aspiration Pneumonia (POA?) _______Gram Negative Pneumonia (POA?)  Bacterial pneumonia, specify type if known (POA?) _______Klebsiella PNA _______E Coli PNA _______Pseudomonas PNA _______Candidiasis PNA _______Staph/MRSA PNA _______Strep PNA _______Pneumococcal PNA _______H Influenza PNA _______H Para Influenza PNA  _______Viral PNA (POA?)  _______Pneumonia (CAP, HAP) (POA?) _______Other Condition____________________ _______Cannot Clinically Determine    Supporting Information:  Signs & Symptoms:  Noted per 1/20 progress note:  ILD vs atypical pneumonia.   You may use possible, probable, or suspect with inpatient documentation. possible, probable, suspected diagnoses MUST be documented at the time of discharge  Reviewed: Physician responded to query in the progress notes. Thank You,  Marciano Sequin,  Clinical Documentation Specialist:  Pager: [phone]  Health Information Management 

## 2011-11-10 NOTE — Progress Notes (Signed)
Physical Therapy Evaluation Patient Details Name: JAZLYNE GAUGER MRN: [id number] DOB: 1946/05/29 Today's Date: 11/10/2011  Problem List:  Patient Active Problem List  Diagnoses  . HYPERTHYROIDISM  . HYPOTHYROIDISM  . DEPRESSION  . HYPERTENSION  . ALLERGIC RHINITIS  . ASTHMA  . SHOULDER PAIN, LEFT  . BACK PAIN  . OSTEOPOROSIS  . SINUSITIS, CHRONIC  . FATIGUE  . Preventative health care  . Vaginitis  . Back pain  . Bronchitis  . Abnormal CXR  . Hypoxia  . Hyponatremia  . Macrocytosis without anemia    Past Medical History:  Past Medical History  Diagnosis Date  . ALLERGIC RHINITIS 05/01/2007  . ASTHMA 05/01/2007  . BACK PAIN 10/10/2008  . DEPRESSION 07/21/2008  . FATIGUE 10/10/2010  . HYPERTENSION 05/01/2007  . HYPERTHYROIDISM 11/23/2007    Pt endorses having had Graves disease, possibly radioactive iodine x1, but no thyroidectomy  . HYPOTHYROIDISM 08/23/2009  . LUMBAR RADICULOPATHY, RIGHT 08/25/2008  . OSTEOPOROSIS 05/01/2007  . SHOULDER PAIN, LEFT 08/23/2009  . SINUSITIS, CHRONIC 10/10/2010  . SINUSITIS- ACUTE-NOS 10/10/2008   Past Surgical History:  Past Surgical History  Procedure Date  . Appendectomy   . Ectopic pregnancy surgery   . Post fallopian tube removal right side   . History of multiple dialations and curettages and miscarriages, unfortunately never carrying a child to term   . Electrocardiogram 06/20/2006    PT Assessment/Plan/Recommendation PT Assessment Clinical Impression Statement: Mrs. Woerner is 66 y/o AA female who was admitted to [facility] with hypoxia and PNA. PT eval now completed. Pt's O2 sats dropped into lower-mid 80s during ambulation on RA needing O2 to recover (see ambulation section of note). Pt also noted to have slower cognition and an increase in her impaired balance during this period of hypoxia. Pt will benefit from physical therapy in the acute setting to maximize her balance and activity tolerance so as to increase safety for d/c home. I  also think this pt would benefit from a SW consult so as to further investigate the safety of the home situation. 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 They may benefit HH aide to assist with husband as pt not a point right now that she can be safe assisting with heavy physical transfers. Rec HHPT vs OPPT for follow up therapies.  PT Recommendation/Assessment: Patient will need skilled PT in the acute care venue PT Problem List: Decreased activity tolerance;Cardiopulmonary status limiting activity;Decreased balance Barriers to Discharge: Decreased caregiver support (decreased support for husband) PT Therapy Diagnosis : Difficulty walking;Abnormality of gait PT Plan PT Frequency: Min 3X/week PT Treatment/Interventions: Gait training;Stair training;Functional mobility training;Neuromuscular re-education;Balance training;Therapeutic exercise;Therapeutic activities;Patient/family education PT Recommendation Follow Up Recommendations: Home health PT Equipment Recommended:  (tbd) PT Goals  Acute Rehab PT Goals PT Goal Formulation: With patient Pt will Ambulate: >150 feet;with modified independence;with least restrictive assistive device (while O2 sats remain >/=90%) PT Goal: Ambulate - Progress: Goal set today Pt will Go Up / Down Stairs: 3-5 stairs;with modified independence;with least restrictive assistive device PT Goal: Up/Down Stairs - Progress: Goal set today Pt will Perform Home Exercise Program: Independently PT Goal: Perform Home Exercise Program - Progress: Goal set today Additional Goals Additional Goal #1: Pt will demo decreased risk of falls with Berg score >/=50 PT Goal: Additional Goal #1 - Progress: Goal set today Additional Goal #2: Pt will demo decrease risk of falls with DGI score >/=20 PT Goal: Additional Goal #2 -  Progress: Goal set today  PT Evaluation Precautions/Restrictions  Precautions Precautions: Fall Prior  Functioning  Home Living Lives With: Spouse (husband has RLE amputation) Receives Help From: Family (brother in law helps some) Type of Home: House Home Adaptive Equipment: None (husband has w/c and walker) Additional Comments: 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 who is basically w/c bound with RLE amputation; reports the RW and w/c can't fit into the bathroom she has to help him get there as well as to and from w/c-bed Prior Function Level of Independence: Independent with basic ADLs;Independent with homemaking with ambulation;Independent with gait;Independent with transfers Cognition Cognition Arousal/Alertness: Awake/alert Overall Cognitive Status: Appears within functional limits for tasks assessed Cognition - Other Comments: During ambulation pt became slower cognitively with regards to her processing (coinciding with her O2 sats dropping into the mid and lower 80s) Sensation/Coordination Sensation Light Touch: Appears Intact Coordination Gross Motor Movements are Fluid and Coordinated: Yes Fine Motor Movements are Fluid and Coordinated: Yes Extremity Assessment RUE Assessment RUE Assessment: Within Functional Limits LUE Assessment LUE Assessment: Within Functional Limits RLE Assessment RLE Assessment: Within Functional Limits LLE Assessment LLE Assessment: Within Functional Limits Mobility (including Balance) Bed Mobility Bed Mobility: Yes Supine to Sit: 6: Modified independent (Device/Increase time) Sit to Supine: 6: Modified independent (Device/Increase time) Transfers Transfers: Yes Sit to Stand: 6: Modified independent (Device/Increase time) Stand to Sit: 6: Modified independent (Device/Increase time) Ambulation/Gait Ambulation/Gait: Yes Ambulation/Gait Assistance: 4: Min assist Ambulation/Gait Assistance Details (indicate cue type and reason): pt amb. approx 200 ft with mingaurdA-minA; initially pt amb with mingaurdA on RA with O2 sats 90-90%; pt amb  approx 100 ft and O2 sats began to drop regardless of cueing for diaphragmatic breathing (demonstration and verbal cues); as pts O2 sats dropped her cognition became noticably slower as well as her gait speed; put the patient back on 3 liters of O2 and within a minute the pt had increased her O2 sats to 92-95%; demonstrating slower gait speed as well as lateral instability needing minA to steady self Ambulation Distance (Feet): 200 Feet Gait Pattern: Trunk flexed;Decreased stride length  Posture/Postural Control Posture/Postural Control: No significant limitations Balance Balance Assessed: Yes Static Standing Balance Static Standing - Balance Support: No upper extremity supported Static Standing - Level of Assistance: 4: Min assist;5: Stand by assistance Static Standing - Comment/# of Minutes: (all standing balance tests done on 3 liters of O2) Single Leg Stance - Right Leg: 3  Single Leg Stance - Left Leg: 3  Tandem Stance - Right Leg: 30  Rhomberg - Eyes Opened: 60  Rhomberg - Eyes Closed: 60  (with increased supervision 2/2 sway) Dynamic Standing Balance Dynamic Standing - Balance Support: Left upper extremity supported Dynamic Standing - Level of Assistance: 4: Min assist Dynamic Standing - Comments: pt performed reciprocal toe taps up onto step needing minA to steady self and LUE supported  Exercise    End of Session PT - End of Session Equipment Utilized During Treatment: Gait belt Activity Tolerance: Patient tolerated treatment well;Treatment limited secondary to medical complications (Comment) (O2 sats dropped during ambulation) Patient left: in bed;with call bell in reach Nurse Communication: Mobility status for transfers;Mobility status for ambulation (need for SW consult) General Behavior During Session: [facility] for tasks performed Cognition: [facility] for tasks performed (appeared impaired as O2 sats began to drop)  WHITLOW,Lia Vigilante HELEN 11/10/2011, 1:17 PM

## 2011-11-11 ENCOUNTER — Inpatient Hospital Stay (HOSPITAL_COMMUNITY): Payer: Medicare Other

## 2011-11-11 ENCOUNTER — Other Ambulatory Visit: Payer: Self-pay

## 2011-11-11 DIAGNOSIS — I517 Cardiomegaly: Secondary | ICD-10-CM

## 2011-11-11 LAB — FOLATE RBC: RBC Folate: 669 ng/mL — ABNORMAL HIGH (ref >=366)

## 2011-11-11 LAB — BASIC METABOLIC PANEL
BUN: 11 mg/dL (ref 6–23)
CO2: 32 mEq/L (ref 19–32)
GFR calc non Af Amer: 88 mL/min — ABNORMAL LOW (ref >90)
Glucose, Bld: 141 mg/dL — ABNORMAL HIGH (ref 70–99)
Potassium: 4.2 mEq/L (ref 3.5–5.1)

## 2011-11-11 MED ORDER — IOHEXOL 300 MG/ML  SOLN
100.0000 mL | Freq: Once | INTRAMUSCULAR | Status: AC | PRN
  Administered 2011-11-11: 100 mL via INTRAVENOUS

## 2011-11-11 MED ORDER — METHYLPREDNISOLONE SODIUM SUCC 40 MG IJ SOLR
40.0000 mg | Freq: Two times a day (BID) | INTRAMUSCULAR | Status: DC
  Administered 2011-11-11 – 2011-11-12 (×2): 40 mg via INTRAVENOUS
  Filled 2011-11-11 (×4): qty 1

## 2011-11-11 NOTE — Progress Notes (Signed)
  Echocardiogram 2D Echocardiogram has been performed.  Cathie Beams Deneen 11/11/2011, 10:56 AM

## 2011-11-11 NOTE — Progress Notes (Signed)
PATIENT DETAILS Name: Jasmine [name] Age: 66 y.o. Sex: female Date of Birth: [birth year] Admit Date: 11/09/2011 [id number] John, MD, MD  Subjective:  better  Objective: Vital signs in last 24 hours: Filed Vitals:   11/10/11 2007 11/11/11 0217 11/11/11 0610 11/11/11 0801  BP: 107/69  120/71   Pulse: 80  68   Temp: 98.4 F (36.9 C)  97 F (36.1 C)   TempSrc: Oral  Oral   Resp: 18  19   Height:      Weight:      SpO2: 96% 92% 99% 90%    Weight change:   Body mass index is 22.24 kg/(m^2).  Intake/Output from previous day:  Intake/Output Summary (Last 24 hours) at 11/11/11 1243 Last data filed at 11/11/11 0800  Gross per 24 hour  Intake   1980 ml  Output      0 ml  Net   1980 ml    PHYSICAL EXAM: Gen Exam: Awake and alert with clear speech.   Neck: Supple, No JVD.   Chest: B/L good air entry with prolonged expiration, rhonchi heard all over CVS: S1 S2 Regular, no murmurs.  Abdomen: soft, BS +, non tender, non distended.  Extremities: no edema, lower extremities warm to touch. Neurologic: Non Focal.   Skin: No Rash.  Wounds: N/A.    CONSULTS:  none  LAB RESULTS: CBC  Lab 11/10/11 0600 11/09/11 1639  WBC 3.3* 4.8  HGB 16.5* 17.3*  HCT 49.8* 51.3*  PLT 158 151  MCV 101.0* 101.0*  MCH 33.5 34.1*  MCHC 33.1 33.7  RDW 14.9 15.0  LYMPHSABS -- 1.4  MONOABS -- 0.9  EOSABS -- 0.0  BASOSABS -- 0.0  BANDABS -- --    Chemistries   Lab 11/11/11 0750 11/10/11 0600 11/09/11 1639  NA 139 139 133*  K 4.2 5.2* 4.5  CL 100 101 95*  CO2 32 33* 33*  GLUCOSE 141* 125* 110*  BUN 11 12 9   CREATININE 0.70 0.73 0.79  CALCIUM 8.7 8.7 9.0  MG -- -- --    GFR Estimated Creatinine Clearance: 49 ml/min (by C-G formula based on Cr of 0.7).  Coagulation profile No results found for this basename: INR:5,PROTIME:5 in the last 168 hours  Cardiac Enzymes  Lab 11/10/11 0600 11/09/11 1639  CKMB -- --  TROPONINI <0.30 <0.30  MYOGLOBIN -- --    No components found  with this basename: POCBNP:3 No results found for this basename: DDIMER:2 in the last 72 hours No results found for this basename: HGBA1C:2 in the last 72 hours No results found for this basename: CHOL:2,HDL:2,LDLCALC:2,TRIG:2,CHOLHDL:2,LDLDIRECT:2 in the last 72 hours No results found for this basename: TSH,T4TOTAL,FREET3,T3FREE,THYROIDAB in the last 72 hours  Basename 11/10/11 0600  VITAMINB12 767  FOLATE --  FERRITIN --  TIBC --  IRON --  RETICCTPCT --   No results found for this basename: LIPASE:2,AMYLASE:2 in the last 72 hours  Urine Studies No results found for this basename: UACOL:2,UAPR:2,USPG:2,UPH:2,UTP:2,UGL:2,UKET:2,UBIL:2,UHGB:2,UNIT:2,UROB:2,ULEU:2,UEPI:2,UWBC:2,URBC:2,UBAC:2,CAST:2,CRYS:2,UCOM:2,BILUA:2 in the last 72 hours  MICROBIOLOGY: No results found for this or any previous visit (from the past 240 hour(s)).  RADIOLOGY STUDIES/RESULTS: Dg Chest 2 View  11/09/2011  *RADIOLOGY REPORT*  Clinical Data: Shortness of breath.  CHEST - 2 VIEW  Comparison: PA and lateral chest 11/09/2011 at 3:14 p.m. and 05/10/2007.  Findings: Lungs are emphysematous.  Small bilateral pleural effusions and prominence of the pulmonary interstitium, particularly in the bases, are again seen.  Basilar interstitial opacities appear improved.  No pneumothorax.  IMPRESSION: Some improvement in basilar interstitial opacities which may be due to decreased atelectasis or edema.  No new abnormality.  Emphysema again noted.  Original Report Authenticated By: Bernadene Bell. Maricela Curet, M.D.   Dg Chest 2 View  11/09/2011  *RADIOLOGY REPORT*  Clinical Data: Shortness of breath  CHEST - 2 VIEW  Comparison: 05/10/2007  Findings: Increased bibasilar interstitial markings and possible bronchiectasis or cystic change noted.  Trace pleural effusion may be evident bilaterally.  Heart size is mildly enlarged. Aorta is ectatic and unfolded.  No acute osseous finding.  IMPRESSION: Increased bibasilar interstitial changes  with bronchiectasis or cystic change is noted.  If outside evaluation has not already been performed, outpatient nonemergent high-resolution chest CT should be considered for evaluation for possible interstitial lung disease.  This could represent acute atypical infection, as well.  Original Report Authenticated By: Harrel Lemon, M.D.    MEDICATIONS: Scheduled Meds:    . albuterol  2.5 mg Nebulization Q6H  . aspirin  81 mg Oral Daily  . citalopram  10 mg Oral Daily  . docusate sodium  100 mg Oral BID  . guaiFENesin  600 mg Oral BID  . heparin  5,000 Units Subcutaneous Q8H  . ipratropium  0.5 mg Nebulization Q6H  . levothyroxine  50 mcg Oral Daily  . methylPREDNISolone (SOLU-MEDROL) injection  60 mg Intravenous Q12H  . moxifloxacin  400 mg Oral q1800  . senna  1 tablet Oral BID   Continuous Infusions:  PRN Meds:.acetaminophen, acetaminophen, albuterol, ALPRAZolam, ondansetron (ZOFRAN) IV, ondansetron  Antibiotics: Anti-infectives     Start     Dose/Rate Route Frequency Ordered Stop   11/11/11 1000   azithromycin (ZITHROMAX) tablet 250 mg  Status:  Discontinued        250 mg Oral Daily 11/09/11 2234 11/10/11 0848   11/10/11 1800   moxifloxacin (AVELOX) tablet 400 mg        400 mg Oral Daily-1800 11/10/11 0848     11/10/11 1000   azithromycin (ZITHROMAX) tablet 500 mg  Status:  Discontinued        500 mg Oral Daily 11/09/11 2234 11/10/11 0848   11/09/11 1645   moxifloxacin (AVELOX) IVPB 400 mg        400 mg 250 mL/hr over 60 Minutes Intravenous  Once 11/09/11 1640 11/09/11 1814          Assessment/Plan: Patient Active Hospital Problem List:  acute exacerbation of COPD  - decrease solumedrol -Continue with nebs -home 02 screen  Hypoxic respiratory failure  -Secondary to above  -Currently stable with 2 L of oxygen via nasal cannula  -Symptomatically/clinically much better   Abnormal CXR  ? Fibrosis-get CT chest  Pul HTN -Echo showing RV dilation -likely  corpulmonale -Get CT chest to make sure no fibrosis -Outpatent PFT's and pul eval  Hyponatremia -Likely secondary to diuretics -Resolved with hydration  Mild hyperkalemia -Kayexalate  Hypothyroidism -Continue with levothyroxine -For the TSH monitoring as an outpatient  Anxiety/depression -Stable, continue with Celexa -As needed Xanax  Hypertension -Currently controlled without the need for any antihypertensives -We'll continue to monitor BP and restart antihypertensive if need be.  Abnormal EKG -Recheck 2-D echocardiogram for signs of pulmonary hypertension  Tobacco abuse -Have counseled extensively.  Disposition: -Remain inpatient  DVT Prophylaxis: Subcutaneous heparin  Code Status: Full code  Maretta Bees,  MD. 11/11/2011, 12:43 PM

## 2011-11-11 NOTE — Progress Notes (Signed)
Physical Therapy Treatment Patient Details Name: Jasmine [name] MRN: [id number] DOB: 06-Aug-1946 Today's Date: 11/11/2011  PT Assessment/Plan  PT - Assessment/Plan Comments on Treatment Session: Better today but still needed O2 during ambulation. Pt reports likely d/c home tomorrow. Will practice steps next visit.  PT Plan: Discharge plan remains appropriate PT Goals  Acute Rehab PT Goals PT Goal: Ambulate - Progress: Progressing toward goal PT Goal: Up/Down Stairs - Progress: Not progressing (not addressed today) PT Goal: Perform Home Exercise Program - Progress: Progressing toward goal Additional Goals PT Goal: Additional Goal #1 - Progress: Progressing toward goal PT Goal: Additional Goal #2 - Progress: Progressing toward goal  PT Treatment Precautions/Restrictions  Precautions Precautions: Fall Mobility (including Balance) Bed Mobility Bed Mobility: No Transfers Sit to Stand: 6: Modified independent (Device/Increase time) Stand to Sit: 6: Modified independent (Device/Increase time) Ambulation/Gait Ambulation/Gait Assistance: 5: Supervision Ambulation/Gait Assistance Details (indicate cue type and reason): amb. approx 100 ft on RA and pt progressively desatted to 85% and regardless of cueing pt unable to bring Sao2 back up to 90% without 2 L [place] O2. Pt then proceeded to ambulate another 200 ft without AD and supervision. Amb with decreased gait speed. When cued to increase her speed she reports she was too afraid. Also attempted to get pt to perform higher level challenges to test/challenge balance (i.e. head turns). She would maintain head turns for less than 2 seconds because she felt off balance. Better than yesterday.  Assistive device: None  High Level Balance High Level Balance Activites: Turns;Head turns High Level Balance Comments: See gait section. No major LOB noted during these activities but pt unable to sustain her head turns for longer than 3 seconds due to fear.  Turning in the hallway pt with decreased speed.    End of Session PT - End of Session Equipment Utilized During Treatment: Gait belt Activity Tolerance: Patient tolerated treatment well;Patient limited by fatigue Patient left: in chair Nurse Communication: Mobility status for transfers;Mobility status for ambulation General Behavior During Session: [facility] for tasks performed Cognition: [facility] for tasks performed  [facility] HELEN 11/11/2011, 3:44 PM

## 2011-11-12 LAB — GLUCOSE, CAPILLARY: Glucose-Capillary: 97 mg/dL (ref 70–99)

## 2011-11-12 MED ORDER — MOXIFLOXACIN HCL 400 MG PO TABS: 400.0000 mg | ORAL_TABLET | Freq: Every day | ORAL | Status: AC

## 2011-11-12 MED ORDER — PREDNISONE (PAK) 10 MG PO TABS: ORAL_TABLET | ORAL | Status: DC

## 2011-11-12 MED ORDER — GUAIFENESIN ER 1200 MG PO TB12: 1.0000 | ORAL_TABLET | Freq: Two times a day (BID) | ORAL | Status: DC

## 2011-11-12 NOTE — Progress Notes (Signed)
HOSPITAL DISCHARGE SUMMARY  Jasmine [name]  MRN: [id number]  DOB:08-Jan-1946  Date of Admission: 11/09/2011 Date of Discharge: 11/12/2011         LOS: 3 days   Attending Physician:Rayyan Burley A  Patient's YQM:VHQIO Jonny Ruiz, MD, MD  Consults: None  Discharge Diagnoses: Present on Admission:  .Bronchitis .Abnormal CXR .Acute respiratory failure with hypoxia .Hyponatremia .Macrocytosis without anemia   Medication List  As of 11/12/2011  2:59 PM   STOP taking these medications         fluticasone 50 MCG/ACT nasal spray      PROAIR HFA 108 (90 BASE) MCG/ACT inhaler      risedronate 150 MG tablet      TUSSIN CF PO         TAKE these medications         ADVAIR DISKUS 250-50 MCG/DOSE Aepb   Generic drug: Fluticasone-Salmeterol   Inhale 1 puff into the lungs 2 (two) times daily.      albuterol 108 (90 BASE) MCG/ACT inhaler   Commonly known as: PROVENTIL HFA;VENTOLIN HFA   Inhale 2 puffs into the lungs every 6 (six) hours as needed. For shortness of breath      ALPRAZolam 0.25 MG tablet   Commonly known as: XANAX   Take 0.25 mg by mouth 2 (two) times daily as needed.      aspirin 81 MG EC tablet   Take 81 mg by mouth daily.      calcium carbonate 600 MG Tabs   Commonly known as: OS-CAL   Take 600 mg by mouth 2 (two) times daily.      celecoxib 200 MG capsule   Commonly known as: CELEBREX   Take 200 mg by mouth 2 (two) times daily as needed. For arthritis pain      cetirizine 10 MG tablet   Commonly known as: ZYRTEC   Take 10 mg by mouth daily.      citalopram 10 MG tablet   Commonly known as: CELEXA   Take 10 mg by mouth daily.      Guaifenesin 1200 MG Tb12   Take 1 tablet (1,200 mg total) by mouth 2 (two) times daily.      levothyroxine 50 MCG tablet   Commonly known as: SYNTHROID, LEVOTHROID   Take 50 mcg by mouth daily.      moxifloxacin 400 MG tablet   Commonly known as: AVELOX   Take 1 tablet (400 mg total) by mouth daily at 6 PM.      predniSONE  10 MG tablet   Commonly known as: STERAPRED UNI-PAK   Take 6-5-4-3-2-1 till gone      triamterene-hydrochlorothiazide 37.5-25 MG per capsule   Commonly known as: DYAZIDE   Take 1 capsule by mouth daily.             Brief Admission History: 66yoF with h/o asthma, ? Graves disease, and polysubstance abuse presents with cough/congestion and found to be hypoxic, with abnormal CXR. Pt states she developed chest congestion about 3 wks ago consisting of severe, junky cough productive of thick clearish, slightly yellow phlegm. Cough has been inhibiting good sleep. She does not endorse systemic illness though, no fevers, chills, sweats, although she feels a bit run down. She feels a little SOB but no respiratory distress, and small amt of decreased appetite. No myalgias or other flu-like symptoms.  She went to see urgent care where she was noted to be satting in the high 80's on RA, was given  one neb and sent to [facility] ED. In the ED, she was noted to require 2L of [place] to maintain sats >90%; also with minimal tachycardia to 108. Labs significant for Na 133, Cl 95, HCO3 of 33, renal 9/0.79. Trop negative x1. Hct was 51 with MCV 101. CXR showed increased bibasilar interstitial changes with bronchiectasis or cystic change, recommend outpt nonemergent high- resolution chest CT for evaluation of possible interstitial lung disease, vs atypical infxn. Pt was given nebs, 125 mg solumedrol, Avelox. Finally, ECG noted to have anterior TW inversions, but negative Trop, but given ASA 325.   Hospital Course: Present on Admission:  .Bronchitis .Abnormal CXR .Acute respiratory failure with hypoxia .Hyponatremia .Macrocytosis without anemia  1. Acute respiratory failure with hypoxia: Patient admitted to the hospital she was satting in the 80s on room air. She started on supplemental oxygen through nasal cannula. 2 L of oxygen her saturation went up to above 90%. Patient does have COPD and probably she'll need the  oxygen for long time. This is been triggered by probably COPD exacerbation. CT scan of chest was done and ruled out PE, showed centrilobular emphysema and small bilateral effusion.  2. COPD exacerbation: At the time of admission patient started on bronchodilators, systemic steroids, supplemental oxygen, IV antibiotics and mucolytics. Patient did very well and is feeling better to the point that she requested to be discharged. Patient discharged on antibiotics, a mucolytic and prednisone taper.  3. RV dilation: This is likely cor pulmonale. Echocardiogram showed moderately to severely dilated right ventricle. Patient does not have symptoms or signs of congestion. So her home medication continued and no Lasix was added. Patient probably needs to followup with pulmonologist as outpatient, to maximize bronchodilator therapy if it can help with the RV dilation. LVEF is 68%, and no mention of pulmonary arterial hypertension.  4. HTN: Controlled without any need of hypertensive blood pressure was monitored during the hospital stay no changes were done.  5. Tobacco abuse: Counseled extensively.  6. Anxiety/depression this is been stable on Celexa. And as needed Xanax.   Day of Discharge BP 109/62  Pulse 79  Temp(Src) 98.2 F (36.8 C) (Oral)  Resp 18  Ht 5' (1.524 m)  Wt 51.665 kg (113 lb 14.4 oz)  BMI 22.24 kg/m2  SpO2 92% Physical Exam: GEN: No acute distress, cooperative with exam PSYCH: He is alert and oriented x4; does not appear anxious does not appear depressed; affect is normal  HEENT: Mucous membranes pink and anicteric;  Mouth: without oral thrush or lesions Eyes: PERRLA; EOM intact;  Neck: no cervical lymphadenopathy nor thyromegaly or carotid bruit; no JVD;  CHEST WALL: No tenderness, symmetrical to breathing bilaterally CHEST: Normal respiration, clear to auscultation bilaterally  HEART: Regular rate and rhythm; no murmurs, rubs or gallops, S1 and S2 heard  BACK: No kyphosis or  scoliosis; no CVA tenderness  ABDOMEN:  soft non-tender; no masses, no organomegaly, normal abdominal bowel sounds; no pannus; no intertriginous candida.  EXTREMITIES: No bone or joint deformity; no edema; no ulcerations.  PULSES: 2+ and symmetric, neurovascularity is intact SKIN: Normal hydration no rash or ulceration, no flushing or suspicious lesions  CNS: Cranial nerves 2-12 grossly intact no focal neurologic deficit, coordination is intact gait not tested    Results for orders placed during the hospital encounter of 11/09/11 (from the past 24 hour(s))  GLUCOSE, CAPILLARY     Status: Normal   Collection Time   11/12/11  6:36 AM      Component  Value Range   Glucose-Capillary 97  70 - 99 (mg/dL)    Disposition: Home with home health services   Follow-up Appts: Discharge Orders    Future Orders Please Complete By Expires   Diet - low sodium heart healthy      Increase activity slowly         Follow-up Information    Follow up with Oliver Barre, MD. Schedule an appointment as soon as possible for a visit in 1 week.         I spent 40 minutes completing paperwork and coordinating discharge efforts.  SignedClydia Llano A 11/12/2011, 2:59 PM

## 2011-11-12 NOTE — Progress Notes (Signed)
11/12/2011 Fransico Michael SPARKS Case Management Note [phone]  HOME HEALTH AGENCIES SERVING [facility]   Agencies that are Medicare-Certified and are affiliated with [facility]  Telephone Number Address  [facility]   The [facility] has ownership interest in this company; however, you are under no obligation to use this agency. [phone] or  [phone] [address] [place], [place] [address]   Agencies that are Medicare-Certified and are not affiliated with [facility] Agency Telephone Number Address  [facility] [phone] Fax [phone] [address] [place], [place]  [address]  [facility] [phone] or [phone] Fax [phone] [address] [place], [place] [address]  [facility] [phone] Fax [phone] [address] [place], [place] [address]  [facility] [phone] Fax [phone] [address] [place], [place]  [address]  [facility] [phone] Fax [phone] [address] [place], [place] [address]  [facility] [phone] [address] [place], [place] [address]  [facility] [phone]  [address] [place], [place] [address]  [facility] [phone] or [phone] Fax [phone] [address], [address] [place], [place]  [address]  [facility] [phone] Fax [phone] [address] [place], [place]  [address]  [facility]  [phone] Fax [phone] [address] [place], [place] [address]               Agencies that are not Medicare-Certified and are not affiliated with [facility] Agency Telephone Number Address  [facility], [place] [phone] or [phone] Fax [phone] [address] [place]  [address]  [facility] [phone] Fax [phone] [address] [place], [place]  [address]  [facility]  [phone] Fax [phone] [address] [place], [place] [address]  [facility] [phone] Fax [phone] [address] [place], [place] [address]  [facility] [phone] or [phone] Fax [phone] [address] [place], [place]  [address]  [facility] [phone] or [phone] Fax [phone] [address], Suite [place], [place]  [address]  [facility] [phone] Fax [phone] [address] [place], [place]  [address]  [facility] [phone] [address] [place], [place]  [address]  [facility] [phone] Fax [phone] [address] [place], [place]  [address]  [facility] [phone] Fax [phone] [address] [place], [place]  [address]  [facility] [phone] Fax [phone] [address] [place], [place] [address]  [facility] [phone] Fax [phone] [address] [place], [place]  [address]   In to see patient and offer choice of home health  agencies. Patient chose [facility] for skilled services. Justin with [facility] notified of need for home oxygen DME. [facility] to be notified of referral.

## 2011-11-12 NOTE — Progress Notes (Signed)
Sitting SaO2 saturation on RA-86% Sitting SaO2 on 2L O2 nasal cannula-97% Ave Filter

## 2011-11-12 NOTE — Progress Notes (Signed)
Physical Therapy Treatment Patient Details Name: KAYSEA RAYA MRN: [id number] DOB: 1946-06-03 Today's Date: 11/12/2011  PT Assessment/Plan  PT - Assessment/Plan Comments on Treatment Session: Did great today, really just limited by her activity tolerance/decreased SaO2 even with 2 L O2 by nasal canula. Berg balance score today was great with 50/56. Likley no follow up PT needed when she goes home. Close to meeting all goals, likely d/c next session. Spent at least 10 minutes educating pt on self monitoring her exertion rate and the need for rest breaks. Pt very determined to go home and resume daily activities of cleaning and taking care of others around her, especially her husband who is an amputee. Still recommend a SW consult for other assist/aides at home.  PT Plan: Discharge plan needs to be updated PT Frequency: Min 3X/week Follow Up Recommendations: HH safety eval but no HHPT Equipment Recommended: None recommended by PT PT Goals  Acute Rehab PT Goals PT Goal: Ambulate - Progress: Met PT Goal: Up/Down Stairs - Progress: Discontinued (comment) (pt reports a ramp at home) Additional Goals PT Goal: Additional Goal #1 - Progress: Met PT Goal: Additional Goal #2 - Progress: Progressing toward goal  PT Treatment Precautions/Restrictions  Precautions Precautions: Fall Mobility (including Balance) Bed Mobility Supine to Sit:  (pt sitting EOB on my entry) Transfers Sit to Stand: 6: Modified independent (Device/Increase time) Stand to Sit: 6: Modified independent (Device/Increase time) Ambulation/Gait Ambulation/Gait Assistance: 6: Modified independent (Device/Increase time) Ambulation/Gait Assistance Details (indicate cue type and reason): slower gait speed, minimal stagger but able to change speed and direction without major LOB; limited primarily by decreased activity tolerance and decreased O2 sats (into the 80s even with 2 L O2 [place], needing two seated rest breaks to  recover) Assistive device: None Gait Pattern: Decreased stride length  Posture/Postural Control Posture/Postural Control: No significant limitations Berg Balance Test Sit to Stand: Able to stand  independently using hands Standing Unsupported: Able to stand safely 2 minutes Sitting with Back Unsupported but Feet Supported on Floor or Stool: Able to sit safely and securely 2 minutes Stand to Sit: Sits safely with minimal use of hands Transfers: Able to transfer safely, minor use of hands Standing Unsupported with Eyes Closed: Able to stand 10 seconds safely Standing Ubsupported with Feet Together: Able to place feet together independently and stand 1 minute safely From Standing, Reach Forward with Outstretched Arm: Can reach confidently >25 cm (10") From Standing Position, Pick up Object from Floor: Able to pick up shoe safely and easily From Standing Position, Turn to Look Behind Over each Shoulder: Looks behind from both sides and weight shifts well Turn 360 Degrees: Able to turn 360 degrees safely in 4 seconds or less Standing Unsupported, Alternately Place Feet on Step/Stool: Able to stand independently and complete 8 steps >20 seconds Standing Unsupported, One Foot in Front: Able to plae foot ahead of the other independently and hold 30 seconds Standing on One Leg: Tries to lift leg/unable to hold 3 seconds but remains standing independently Total Score: 50  Dynamic Gait Index Level Surface: Mild Impairment (slower speed) Change in Gait Speed: Normal Gait with Horizontal Head Turns: Mild Impairment Gait with Vertical Head Turns: Mild Impairment Gait and Pivot Turn: Normal Step Over Obstacle:  (did not complete because of fatigue) Step Around Obstacles:  (did not complete because of fatigue) Steps:  (did not complete because of fatigue) High Level Balance High Level Balance Comments: Portions of the DGI were completed today but we were unable  to completely finish because of fatigue.  No major LOB noted with any of the components. Pt amb with decreased speed but good ability to change speed and direction. Slows down with head turns in all directions.  End of Session PT - End of Session Equipment Utilized During Treatment: Gait belt Activity Tolerance: Treatment limited secondary to medical complications (Comment);Patient tolerated treatment well (limited by decreased activity tolerance and O2 sats dropping) Nurse Communication: Mobility status for transfers;Mobility status for ambulation General Behavior During Session: [facility] for tasks performed Cognition: [facility] for tasks performed  [facility] HELEN 11/12/2011, 9:25 AM

## 2011-11-12 NOTE — Progress Notes (Signed)
11/12/2011 [facility], Bosie Clos SPARKS Case Management Note [phone]    CARE MANAGEMENT NOTE 11/12/2011  Patient:  Jasmine [name], Jasmine [name]   Account Number:  [id number]  Date Initiated:  11/12/2011  Documentation initiated by:  Fransico Michael  Subjective/Objective Assessment:   admitted from [facility] on 11/09/11 with c/o congestion and desaturations.     Action/Plan:   prior to admission, patient lived at home with spouse and was independent with ADLs.   Anticipated DC Date:  11/12/2011   Anticipated DC Plan:  HOME W HOME HEALTH SERVICES      DC Planning Services  CM consult      PAC Choice  DURABLE MEDICAL EQUIPMENT  HOME HEALTH   Choice offered to / List presented to:  C-1 Patient   DME arranged  OXYGEN      DME agency  [facility]     [facility] arranged  HH-1 RN  HH-2 PT  HH-4 NURSE'S AIDE      HH agency  [facility]   Status of service:  Completed, signed off Medicare Important Message given?   (If response is "NO", the following Medicare IM given date fields will be blank) Date Medicare IM given:   Date Additional Medicare IM given:    Discharge Disposition:  HOME W HOME HEALTH SERVICES  Per UR Regulation:  Reviewed for med. necessity/level of care/duration of stay  Comments:  11/12/11-1552-J.Marquay Kruse,RN,BSN  [phone]      Spoke with [place] at [facility]. Referral given. Demographics, H&P, face to face, orders for home health services faxed upon request to [phone]. Patient to be discharged home today.  11/12/11-1533-J.Lutricia Horsfall [phone]      66yo female patient admitted as [name] transfer from [facility] on 11/09/11 with c/o congestion and desaturations. Prior to admission, patient lived at home with spouse and was independent with ADLs. Noted that patient desats into the high 80's on room air and recovers with oxygen. In to speak with patient regarding recommendations for home health services. Choice of facilities offered to patient.  [facility] chosen. Oxygen supplies to come from [facility]. Justin with [facility] notified.

## 2011-11-13 ENCOUNTER — Other Ambulatory Visit: Payer: Self-pay | Admitting: Internal Medicine

## 2011-11-14 ENCOUNTER — Other Ambulatory Visit: Payer: Self-pay

## 2011-11-14 NOTE — Telephone Encounter (Signed)
Pt called concerned that Diuretic portion of her BP medication is not strong enough, she has been retaining an increased amount fo fluid, especially in her feet. Pt is requesting MD advisement.

## 2011-11-16 NOTE — Telephone Encounter (Signed)
Swelling likely related to recent pred pak - expect it is temporary - new erx lasix to use daily as needed - should improve and not be needed daily - if continued new swelling after done with pred, need OV to eval same - thanks

## 2011-11-17 MED ORDER — FUROSEMIDE 20 MG PO TABS: 20.0000 mg | ORAL_TABLET | Freq: Every day | ORAL | Status: DC | PRN

## 2011-11-17 NOTE — Telephone Encounter (Signed)
Left message on machine for pt to return my call  

## 2011-11-18 ENCOUNTER — Telehealth: Payer: Self-pay | Admitting: Internal Medicine

## 2011-11-18 NOTE — Telephone Encounter (Signed)
Per pt advair too expensive--Requesting med change---

## 2011-11-18 NOTE — Telephone Encounter (Signed)
Called left message to call back 

## 2011-11-18 NOTE — Telephone Encounter (Signed)
unfort there is no similar generic medication, though she could ask at the pharmacy about the cost of symbicort or dulera and let us know if this is less expensive with her insurance

## 2011-11-19 NOTE — ED Provider Notes (Signed)
Evaluation and management procedures were performed by the resident physician under my supervision/collaboration.    Felisa Bonier, MD 11/19/11 [phone]

## 2011-11-19 NOTE — Telephone Encounter (Signed)
Informed the patient and she will check with her pharmacist and also another pharmacy as well.

## 2011-11-20 ENCOUNTER — Telehealth: Payer: Self-pay

## 2011-11-20 NOTE — Telephone Encounter (Signed)
[facility] called to inform they will be doing PT with this patient for 4 weeks and will keep her MD updated on progress. Also will encourage the patient to schedule a [facility] OV as well. [facility] call back number [phone]. They wanted to inform the patients MD of care.

## 2011-11-27 ENCOUNTER — Ambulatory Visit (INDEPENDENT_AMBULATORY_CARE_PROVIDER_SITE_OTHER): Payer: Medicare Other | Admitting: Internal Medicine

## 2011-11-27 ENCOUNTER — Encounter: Payer: Self-pay | Admitting: Internal Medicine

## 2011-11-27 VITALS — BP 110/62 | HR 92 | Temp 97.7°F | Ht 61.0 in | Wt 107.4 lb

## 2011-11-27 DIAGNOSIS — I10 Essential (primary) hypertension: Secondary | ICD-10-CM

## 2011-11-27 DIAGNOSIS — J441 Chronic obstructive pulmonary disease with (acute) exacerbation: Secondary | ICD-10-CM

## 2011-11-27 DIAGNOSIS — Z0000 Encounter for general adult medical examination without abnormal findings: Secondary | ICD-10-CM

## 2011-11-27 DIAGNOSIS — J9601 Acute respiratory failure with hypoxia: Secondary | ICD-10-CM

## 2011-11-27 DIAGNOSIS — J449 Chronic obstructive pulmonary disease, unspecified: Secondary | ICD-10-CM

## 2011-11-27 DIAGNOSIS — J9600 Acute respiratory failure, unspecified whether with hypoxia or hypercapnia: Secondary | ICD-10-CM

## 2011-11-27 DIAGNOSIS — J40 Bronchitis, not specified as acute or chronic: Secondary | ICD-10-CM

## 2011-11-27 MED ORDER — BUDESONIDE 0.25 MG/2ML IN SUSP: 0.2500 mg | Freq: Every day | RESPIRATORY_TRACT | Status: DC

## 2011-11-27 MED ORDER — ALBUTEROL SULFATE 0.63 MG/3ML IN NEBU: 1.0000 | INHALATION_SOLUTION | Freq: Four times a day (QID) | RESPIRATORY_TRACT | Status: DC | PRN

## 2011-11-27 NOTE — Progress Notes (Signed)
Subjective:    Patient ID: Jasmine [name], female    DOB: [birth year], 66 y.o.   MRN: [id number]  HPI  Here after recent asthma exac with bronchitic symtpoms, also states simply cannot afford the advair, was not able to use prior to last visit, or since, a bit desparate for samples today.  Now breathing back to baseline ambulation, and no fever, chills, prod or other cough, and Pt denies chest pain, orthopnea, PND, increased LE swelling, palpitations, dizziness or syncope.  Pt denies new neurological symptoms such as new headache, or facial or extremity weakness or numbness   Pt denies polydipsia, polyuria  Pt states overall good compliance with meds, trying to follow lower cholesterol, diabetic diet, wt overall stable.  Overall good compliance with treatment, and good medicine tolerability.   Pt denies fever, wt loss, night sweats, loss of appetite, or other constitutional symptoms Past Medical History  Diagnosis Date  . ALLERGIC RHINITIS 05/01/2007  . ASTHMA 05/01/2007  . BACK PAIN 10/10/2008  . DEPRESSION 07/21/2008  . FATIGUE 10/10/2010  . HYPERTENSION 05/01/2007  . HYPERTHYROIDISM 11/23/2007    Pt endorses having had Graves disease, possibly radioactive iodine x1, but no thyroidectomy  . HYPOTHYROIDISM 08/23/2009  . LUMBAR RADICULOPATHY, RIGHT 08/25/2008  . OSTEOPOROSIS 05/01/2007  . SHOULDER PAIN, LEFT 08/23/2009  . SINUSITIS, CHRONIC 10/10/2010  . SINUSITIS- ACUTE-NOS 10/10/2008   Past Surgical History  Procedure Date  . Appendectomy   . Ectopic pregnancy surgery   . Post fallopian tube removal right side   . History of multiple dialations and curettages and miscarriages, unfortunately never carrying a child to term   . Electrocardiogram 06/20/2006    reports that she has been smoking Cigarettes.  She has a 30 pack-year smoking history. She does not have any smokeless tobacco history on file. She reports that she drinks alcohol. She reports that she uses illicit drugs (Cocaine). family  history includes Alcohol abuse in her brother; Diabetes in her brother; Hypertension in her other; Lung cancer in her father; and Stroke in her mother. Allergies  Allergen Reactions  . Alendronate Sodium Other (See Comments)    unknown   Current Outpatient Prescriptions on File Prior to Visit  Medication Sig Dispense Refill  . ADVAIR DISKUS 250-50 MCG/DOSE AEPB USE 1 PUFF BY MOUTH TWO TIMES A DAY  60 each  5  . albuterol (PROVENTIL HFA;VENTOLIN HFA) 108 (90 BASE) MCG/ACT inhaler Inhale 2 puffs into the lungs every 6 (six) hours as needed. For shortness of breath      . ALPRAZolam (XANAX) 0.25 MG tablet Take 0.25 mg by mouth 2 (two) times daily as needed.       [name] aspirin 81 MG EC tablet Take 81 mg by mouth daily.        . calcium carbonate (OS-CAL) 600 MG TABS Take 600 mg by mouth 2 (two) times daily.        . celecoxib (CELEBREX) 200 MG capsule Take 200 mg by mouth 2 (two) times daily as needed. For arthritis pain      . citalopram (CELEXA) 10 MG tablet Take 10 mg by mouth daily.      . furosemide (LASIX) 20 MG tablet Take 1 tablet (20 mg total) by mouth daily as needed (for swelling).  30 tablet  0  . levothyroxine (SYNTHROID, LEVOTHROID) 50 MCG tablet Take 50 mcg by mouth daily.      [name] triamterene-hydrochlorothiazide (DYAZIDE) 37.5-25 MG per capsule Take 1 capsule by mouth daily.      [name]  cetirizine (ZYRTEC) 10 MG tablet Take 10 mg by mouth daily.        . Guaifenesin (MUCINEX MAXIMUM STRENGTH) 1200 MG TB12 Take 1 tablet (1,200 mg total) by mouth 2 (two) times daily.  14 each  0  . predniSONE (STERAPRED UNI-PAK) 10 MG tablet Take 6-5-4-3-2-1 till gone  21 tablet  0   Review of Systems Review of Systems  Constitutional: Negative for diaphoresis and unexpected weight change.  HENT: Negative for drooling and tinnitus.   Eyes: Negative for photophobia and visual disturbance.  Respiratory: Negative for choking and stridor.   Gastrointestinal: Negative for vomiting and blood in stool.    Genitourinary: Negative for hematuria and decreased urine volume.     Objective:   Physical Exam BP 110/62  Pulse 92  Temp(Src) 97.7 F (36.5 C) (Oral)  Ht 5\' 1"  (1.549 m)  Wt 107 lb 6 oz (48.705 kg)  BMI 20.29 kg/m2  SpO2 92% Physical Exam  VS noted Constitutional: Pt appears well-developed and well-nourished.  HENT: Head: Normocephalic.  Right Ear: External ear normal.  Left Ear: External ear normal.  Eyes: Conjunctivae and EOM are normal. Pupils are equal, round, and reactive to light.  Neck: Normal range of motion. Neck supple.  Cardiovascular: Normal rate and regular rhythm.   Pulmonary/Chest: Effort normal and breath sounds decresed wtihout rales or wheeze.  Neurological: Pt is alert. No cranial nerve deficit.  Skin: Skin is warm. No erythema.  Psychiatric: Pt behavior is normal. Thought content normal.     Assessment & Plan:

## 2011-11-27 NOTE — Assessment & Plan Note (Addendum)
Resolved, Ok to d/c oxygen, to cont PT at home as planned

## 2011-11-27 NOTE — Patient Instructions (Signed)
You are given the advair samples today OK to start the nebulizer medications if they are less expensive than the advair Continue all other medications as before Please return in 3 mo with Lab testing done 3-5 days before

## 2011-11-28 ENCOUNTER — Telehealth: Payer: Self-pay

## 2011-11-28 NOTE — Telephone Encounter (Signed)
Message copied by Pincus Sanes on Fri Nov 28, 2011  9:40 AM ------      Message from: Corwin Levins      Created: Thu Nov 27, 2011  5:17 PM       Ok to stay off when finished at this time, as long term prednisone can cause wt gain, elevated blood sugar, blood pressure and even bone loss leading to osteoporosis      ----- Message -----         From: Scharlene Gloss         Sent: 11/27/2011   3:53 PM           To: Oliver Barre, MD            Patient would like to know if it would benefit her to be on prednisone. She stated it helped her so much when she took it.

## 2011-11-28 NOTE — Telephone Encounter (Signed)
Called informed the patient of MD's recommendations on prednisone.

## 2011-11-29 ENCOUNTER — Encounter: Payer: Self-pay | Admitting: Internal Medicine

## 2011-11-29 DIAGNOSIS — J449 Chronic obstructive pulmonary disease, unspecified: Secondary | ICD-10-CM | POA: Insufficient documentation

## 2011-11-29 NOTE — Assessment & Plan Note (Signed)
Improved, to finish post hospn prednisone,  to f/u any worsening symptoms or concerns

## 2011-11-29 NOTE — Assessment & Plan Note (Signed)
Improved, no further tx needed,  to f/u any worsening symptoms or concerns

## 2011-11-29 NOTE — Assessment & Plan Note (Signed)
Cannot afford advair, will try to add home nebs - pulmicort/albuterol,  to f/u any worsening symptoms or concerns

## 2011-11-29 NOTE — Assessment & Plan Note (Signed)
stable overall by hx and exam, most recent data reviewed with pt, and pt to continue medical treatment as before  BP Readings from Last 3 Encounters:  11/27/11 110/62  11/12/11 109/62  11/09/11 137/85

## 2011-12-15 ENCOUNTER — Telehealth: Payer: Self-pay

## 2011-12-15 NOTE — Telephone Encounter (Signed)
Patient called requesting samples of Advair 250/50. Called the patient to inform left 2 boxes of Advair 250/50 at the front desk for her to pickup.

## 2012-01-08 ENCOUNTER — Other Ambulatory Visit: Payer: Self-pay | Admitting: Internal Medicine

## 2012-01-12 ENCOUNTER — Other Ambulatory Visit: Payer: Self-pay

## 2012-01-12 MED ORDER — FUROSEMIDE 20 MG PO TABS: 20.0000 mg | ORAL_TABLET | Freq: Every day | ORAL | Status: DC | PRN

## 2012-01-15 DIAGNOSIS — J449 Chronic obstructive pulmonary disease, unspecified: Secondary | ICD-10-CM

## 2012-01-15 DIAGNOSIS — F3289 Other specified depressive episodes: Secondary | ICD-10-CM

## 2012-01-15 DIAGNOSIS — F329 Major depressive disorder, single episode, unspecified: Secondary | ICD-10-CM

## 2012-01-15 DIAGNOSIS — J4489 Other specified chronic obstructive pulmonary disease: Secondary | ICD-10-CM

## 2012-01-15 DIAGNOSIS — I10 Essential (primary) hypertension: Secondary | ICD-10-CM

## 2012-01-15 DIAGNOSIS — Z9981 Dependence on supplemental oxygen: Secondary | ICD-10-CM

## 2012-01-15 DIAGNOSIS — M810 Age-related osteoporosis without current pathological fracture: Secondary | ICD-10-CM

## 2012-02-23 ENCOUNTER — Telehealth: Payer: Self-pay

## 2012-02-23 MED ORDER — FLUTICASONE-SALMETEROL 500-50 MCG/DOSE IN AEPB: 1.0000 | INHALATION_SPRAY | Freq: Two times a day (BID) | RESPIRATORY_TRACT | Status: DC

## 2012-02-23 NOTE — Telephone Encounter (Signed)
Called the patient left message to call back 

## 2012-02-23 NOTE — Telephone Encounter (Signed)
Ok for advair 500 - done per [employer] for lasix daily, but can hold if swelling is gone that day  For OV in 2 wks please

## 2012-02-23 NOTE — Telephone Encounter (Signed)
The patient called with several questions regarding medications.  The patient is requesting if ok to change her advair from the 250 to 500. She has used the Advair 500 of a family member and states it works better.  Also her lasix prescription is written to be used prn, is it ok to take daily?  The patient canceled her May appointment as was seen in Feb. Does she need to reschedule?

## 2012-02-24 NOTE — Telephone Encounter (Signed)
Called informed the patient of all information.  The patient will call back later today and schedule appointment

## 2012-02-25 ENCOUNTER — Ambulatory Visit: Payer: Medicare Other | Admitting: Internal Medicine

## 2012-04-18 ENCOUNTER — Other Ambulatory Visit: Payer: Self-pay | Admitting: Internal Medicine

## 2012-04-20 ENCOUNTER — Other Ambulatory Visit: Payer: Self-pay

## 2012-04-20 MED ORDER — ALPRAZOLAM 0.25 MG PO TABS: 0.2500 mg | ORAL_TABLET | Freq: Two times a day (BID) | ORAL | Status: DC | PRN

## 2012-04-20 NOTE — Telephone Encounter (Signed)
Done hardcopy to robin  

## 2012-04-20 NOTE — Telephone Encounter (Signed)
Faxed hardcopy to pharmacy. 

## 2012-08-18 ENCOUNTER — Encounter: Payer: Self-pay | Admitting: Internal Medicine

## 2012-08-18 ENCOUNTER — Ambulatory Visit (INDEPENDENT_AMBULATORY_CARE_PROVIDER_SITE_OTHER): Payer: Medicare Other | Admitting: Internal Medicine

## 2012-08-18 ENCOUNTER — Ambulatory Visit (INDEPENDENT_AMBULATORY_CARE_PROVIDER_SITE_OTHER): Payer: Medicare Other

## 2012-08-18 VITALS — BP 108/72 | HR 94 | Temp 96.9°F | Ht 61.0 in | Wt 101.0 lb

## 2012-08-18 DIAGNOSIS — W57XXXA Bitten or stung by nonvenomous insect and other nonvenomous arthropods, initial encounter: Secondary | ICD-10-CM

## 2012-08-18 DIAGNOSIS — L089 Local infection of the skin and subcutaneous tissue, unspecified: Secondary | ICD-10-CM

## 2012-08-18 DIAGNOSIS — T148 Other injury of unspecified body region: Secondary | ICD-10-CM

## 2012-08-18 DIAGNOSIS — Z23 Encounter for immunization: Secondary | ICD-10-CM

## 2012-08-18 MED ORDER — DOXYCYCLINE HYCLATE 100 MG PO TABS: 100.0000 mg | ORAL_TABLET | Freq: Two times a day (BID) | ORAL | Status: DC

## 2012-08-18 MED ORDER — TRIAMCINOLONE ACETONIDE 0.1 % EX CREA: TOPICAL_CREAM | Freq: Two times a day (BID) | CUTANEOUS | Status: DC

## 2012-08-18 NOTE — Progress Notes (Signed)
Subjective:    Patient ID: Jasmine [name], female    DOB: Jun 28, 1946, 66 y.o.   MRN: [id number]  HPI  Pt presents to clinic today with area of swelling and itching on her abdomen that she believes is due to a spider bite. Pt has tried treating the area with alcohol but has not seen any improvement. Pt want to make sure that she has not been bitten by a brown recluse.  Review of Systems  Past Medical History  Diagnosis Date  . ALLERGIC RHINITIS 05/01/2007  . ASTHMA 05/01/2007  . BACK PAIN 10/10/2008  . DEPRESSION 07/21/2008  . FATIGUE 10/10/2010  . HYPERTENSION 05/01/2007  . HYPERTHYROIDISM 11/23/2007    Pt endorses having had Graves disease, possibly radioactive iodine x1, but no thyroidectomy  . HYPOTHYROIDISM 08/23/2009  . LUMBAR RADICULOPATHY, RIGHT 08/25/2008  . OSTEOPOROSIS 05/01/2007  . SHOULDER PAIN, LEFT 08/23/2009  . SINUSITIS, CHRONIC 10/10/2010  . SINUSITIS- ACUTE-NOS 10/10/2008    Current Outpatient Prescriptions  Medication Sig Dispense Refill  . albuterol (ACCUNEB) 0.63 MG/3ML nebulizer solution Take 3 mLs (0.63 mg total) by nebulization every 6 (six) hours as needed for wheezing.  75 mL  12  . albuterol (PROVENTIL HFA;VENTOLIN HFA) 108 (90 BASE) MCG/ACT inhaler Inhale 2 puffs into the lungs every 6 (six) hours as needed. For shortness of breath      . ALPRAZolam (XANAX) 0.25 MG tablet Take 1 tablet (0.25 mg total) by mouth 2 (two) times daily as needed.  60 tablet  1  . aspirin 81 MG EC tablet Take 81 mg by mouth daily.        . budesonide (PULMICORT) 0.25 MG/2ML nebulizer solution Take 2 mLs (0.25 mg total) by nebulization daily.  60 mL  12  . calcium carbonate (OS-CAL) 600 MG TABS Take 600 mg by mouth 2 (two) times daily.        . cetirizine (ZYRTEC) 10 MG tablet Take 10 mg by mouth daily.        . citalopram (CELEXA) 10 MG tablet Take 10 mg by mouth daily.      . Fluticasone-Salmeterol (ADVAIR DISKUS) 500-50 MCG/DOSE AEPB Inhale 1 puff into the lungs 2 (two) times  daily.  180 each  3  . furosemide (LASIX) 20 MG tablet Take 1 tablet (20 mg total) by mouth daily as needed (for swelling).  30 tablet  6  . Guaifenesin (MUCINEX MAXIMUM STRENGTH) 1200 MG TB12 Take 1 tablet (1,200 mg total) by mouth 2 (two) times daily.  14 each  0  . levothyroxine (SYNTHROID, LEVOTHROID) 50 MCG tablet TAKE 1 TABLET EVERY DAY  90 tablet  2  . triamterene-hydrochlorothiazide (DYAZIDE) 37.5-25 MG per capsule Take 1 capsule by mouth daily.      [name] doxycycline (VIBRA-TABS) 100 MG tablet Take 1 tablet (100 mg total) by mouth 2 (two) times daily.  20 tablet  0  . predniSONE (STERAPRED UNI-PAK) 10 MG tablet Take 6-5-4-3-2-1 till gone      . triamcinolone cream (KENALOG) 0.1 % Apply topically 2 (two) times daily.  30 g  0  . DISCONTD: albuterol (PROAIR HFA) 108 (90 BASE) MCG/ACT inhaler       . DISCONTD: levothyroxine (SYNTHROID, LEVOTHROID) 50 MCG tablet Take 50 mcg by mouth daily.        Allergies  Allergen Reactions  . Alendronate Sodium Other (See Comments)    unknown    Family History  Problem Relation Age of Onset  . Alcohol abuse Brother   .  Diabetes Brother   . Hypertension Other   . Lung cancer Father   . Stroke Mother     History   Social History  . Marital Status: Married    Spouse Name: N/A    Number of Children: N/A  . Years of Education: N/A   Occupational History  . 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 Smoking status: Current Every Day Smoker -- 1.0 packs/day for 30 years    Types: Cigarettes  . Smokeless tobacco: Not on file  . Alcohol Use: Yes     2 beers a day, occasional liquor, ? minimizing  . Drug Use: Yes    Special: Cocaine     Crack cocaine a couple times a week  . Sexually Active: Not on file   Other Topics Concern  . Not on file   Social History Narrative   Lives in [place], [place]. Lives with husband, no children, but husband has 3 kids. Ambulatory, without cane or walker. Smokes  cigarettes, currently 1 ppd, smoked for >20-30 yrs. Drinks beer, ~2/day, occasionally liquor, but ? suspect minimizing. Occasional MJ and endorses crack cocaine occasionally     Constitutional: Denies fever, malaise, fatigue, headache or abrupt weight changes.  Respiratory: Denies difficulty breathing, shortness of breath, cough or sputum production.   Cardiovascular: Denies chest pain, chest tightness, palpitations or swelling in the hands or feet.  Musculoskeletal: Denies decrease in range of motion, difficulty with gait, muscle pain or joint pain and swelling.  Skin:  Pt reports rash with redness and itching. Denies lesions or ulcercations.    No other specific complaints in a complete review of systems (except as listed in HPI above).     Objective:   Physical Exam   BP 108/72  Pulse 94  Temp 96.9 F (36.1 C) (Oral)  Ht 5\' 1"  (1.549 m)  Wt 101 lb (45.813 kg)  BMI 19.08 kg/m2  SpO2 91% Wt Readings from Last 3 Encounters:  08/18/12 101 lb (45.813 kg)  11/27/11 107 lb 6 oz (48.705 kg)  11/09/11 113 lb 14.4 oz (51.665 kg)    General: Appears her stated age, well developed, well nourished in NAD. Cardiovascular: Normal rate and rhythm. S1,S2 noted.  No murmur, rubs or gallops noted. No JVD or BLE edema. No carotid bruits noted. Pulmonary/Chest: Normal effort and positive vesicular breath sounds. No respiratory distress. No wheezes, rales or ronchi noted.  Skin: elevated circular patch on the RLQ of the abdomen with central clearing. Does not resemble a fungal infection. Rash is erythematous and scabbed, likely from the patient scratching the area. Musculoskeletal: Normal range of motion. No signs of joint swelling. No difficulty with gait.  Neurological: Alert and oriented. Cranial nerves II-XII intact. Coordination normal. +DTRs bilaterally.         Assessment & Plan:   Insect bite  May take Benadryl every 4-6 hours as needed for itching eRx for Doxycycline 100 mg BID  x 10 days eRx for triamcinolone cream to the affected area.  RTC if symtoms persist or seem to be getting worse.

## 2012-08-18 NOTE — Patient Instructions (Signed)
Insect Bite Mosquitoes, flies, fleas, bedbugs, and many other insects can bite. Insect bites are different from insect stings. A sting is when venom is injected into the skin. Some insect bites can transmit infectious diseases. SYMPTOMS  Insect bites usually turn red, swell, and itch for 2 to 4 days. They often go away on their own. TREATMENT  Your caregiver may prescribe antibiotic medicines if a bacterial infection develops in the bite. HOME CARE INSTRUCTIONS  Do not scratch the bite area.  Keep the bite area clean and dry. Wash the bite area thoroughly with soap and water.  Put ice or cool compresses on the bite area.  Put ice in a plastic bag.  Place a towel between your skin and the bag.  Leave the ice on for 20 minutes, 4 times a day for the first 2 to 3 days, or as directed.  You may apply a baking soda paste, cortisone cream, or calamine lotion to the bite area as directed by your caregiver. This can help reduce itching and swelling.  Only take over-the-counter or prescription medicines as directed by your caregiver.  If you are given antibiotics, take them as directed. Finish them even if you start to feel better. You may need a tetanus shot if:  You cannot remember when you had your last tetanus shot.  You have never had a tetanus shot.  The injury broke your skin. If you get a tetanus shot, your arm may swell, get red, and feel warm to the touch. This is common and not a problem. If you need a tetanus shot and you choose not to have one, there is a rare chance of getting tetanus. Sickness from tetanus can be serious. SEEK IMMEDIATE MEDICAL CARE IF:   You have increased pain, redness, or swelling in the bite area.  You see a red line on the skin coming from the bite.  You have a fever.  You have joint pain.  You have a headache or neck pain.  You have unusual weakness.  You have a rash.  You have chest pain or shortness of breath.  You have abdominal pain,  nausea, or vomiting.  You feel unusually tired or sleepy. MAKE SURE YOU:   Understand these instructions.  Will watch your condition.  Will get help right away if you are not doing well or get worse. Document Released: 11/13/2004 Document Revised: 12/29/2011 Document Reviewed: 05/07/2011 Exit[employer] Patient Information 2013 [employer].  

## 2012-08-19 ENCOUNTER — Ambulatory Visit: Payer: Medicare Other | Admitting: Internal Medicine

## 2012-08-25 ENCOUNTER — Other Ambulatory Visit: Payer: Self-pay | Admitting: Internal Medicine

## 2012-11-10 ENCOUNTER — Encounter: Payer: Medicare Other | Admitting: Internal Medicine

## 2012-11-24 ENCOUNTER — Other Ambulatory Visit: Payer: Self-pay | Admitting: Internal Medicine

## 2012-12-24 ENCOUNTER — Encounter: Payer: Medicare Other | Admitting: Internal Medicine

## 2013-01-12 ENCOUNTER — Ambulatory Visit: Payer: Medicare Other

## 2013-01-12 ENCOUNTER — Encounter: Payer: Self-pay | Admitting: Internal Medicine

## 2013-01-12 ENCOUNTER — Ambulatory Visit (INDEPENDENT_AMBULATORY_CARE_PROVIDER_SITE_OTHER): Payer: Medicare Other | Admitting: Internal Medicine

## 2013-01-12 VITALS — BP 104/72 | HR 88 | Temp 97.7°F | Ht 60.0 in | Wt 103.5 lb

## 2013-01-12 DIAGNOSIS — I2781 Cor pulmonale (chronic): Secondary | ICD-10-CM | POA: Insufficient documentation

## 2013-01-12 DIAGNOSIS — Z0000 Encounter for general adult medical examination without abnormal findings: Secondary | ICD-10-CM

## 2013-01-12 DIAGNOSIS — R609 Edema, unspecified: Secondary | ICD-10-CM

## 2013-01-12 DIAGNOSIS — Z136 Encounter for screening for cardiovascular disorders: Secondary | ICD-10-CM

## 2013-01-12 DIAGNOSIS — R0902 Hypoxemia: Secondary | ICD-10-CM

## 2013-01-12 DIAGNOSIS — I10 Essential (primary) hypertension: Secondary | ICD-10-CM

## 2013-01-12 DIAGNOSIS — J449 Chronic obstructive pulmonary disease, unspecified: Secondary | ICD-10-CM

## 2013-01-12 DIAGNOSIS — F17200 Nicotine dependence, unspecified, uncomplicated: Secondary | ICD-10-CM | POA: Insufficient documentation

## 2013-01-12 LAB — TSH: TSH: 6.44 u[IU]/mL — ABNORMAL HIGH (ref 0.35–5.50)

## 2013-01-12 LAB — CBC WITH DIFFERENTIAL/PLATELET
Basophils Absolute: 0 10*3/uL (ref 0.0–0.1)
Eosinophils Absolute: 0.1 10*3/uL (ref 0.0–0.7)
Hemoglobin: 18.4 g/dL (ref 12.0–15.0)
Lymphocytes Relative: 26.4 % (ref 12.0–46.0)
MCHC: 33 g/dL (ref 30.0–36.0)
Neutro Abs: 2.7 10*3/uL (ref 1.4–7.7)
RDW: 14.9 % — ABNORMAL HIGH (ref 11.5–14.6)

## 2013-01-12 LAB — URINALYSIS, ROUTINE W REFLEX MICROSCOPIC
Bilirubin Urine: NEGATIVE
Hgb urine dipstick: NEGATIVE
Ketones, ur: NEGATIVE
Nitrite: NEGATIVE
Urobilinogen, UA: 1 (ref 0.0–1.0)

## 2013-01-12 LAB — BASIC METABOLIC PANEL
CO2: 37 mEq/L — ABNORMAL HIGH (ref 19–32)
Calcium: 9.9 mg/dL (ref 8.4–10.5)
Chloride: 95 mEq/L — ABNORMAL LOW (ref 96–112)
Potassium: 4.7 mEq/L (ref 3.5–5.1)
Sodium: 142 mEq/L (ref 135–145)

## 2013-01-12 LAB — HEPATIC FUNCTION PANEL
ALT: 30 U/L (ref 0–35)
AST: 38 U/L — ABNORMAL HIGH (ref 0–37)
Alkaline Phosphatase: 76 U/L (ref 39–117)
Bilirubin, Direct: 0.2 mg/dL (ref 0.0–0.3)
Total Protein: 7.2 g/dL (ref 6.0–8.3)

## 2013-01-12 LAB — LIPID PANEL: Total CHOL/HDL Ratio: 2

## 2013-01-12 MED ORDER — PNEUMOCOCCAL VAC POLYVALENT 25 MCG/0.5ML IJ INJ: 0.5000 mL | INJECTION | Freq: Once | INTRAMUSCULAR | Status: DC

## 2013-01-12 MED ORDER — FUROSEMIDE 40 MG PO TABS: 40.0000 mg | ORAL_TABLET | Freq: Every day | ORAL | Status: DC

## 2013-01-12 MED ORDER — ALPRAZOLAM 0.25 MG PO TABS: 0.2500 mg | ORAL_TABLET | Freq: Two times a day (BID) | ORAL | Status: DC | PRN

## 2013-01-12 MED ORDER — POTASSIUM CHLORIDE ER 10 MEQ PO TBCR: 10.0000 meq | EXTENDED_RELEASE_TABLET | Freq: Two times a day (BID) | ORAL | Status: DC

## 2013-01-12 NOTE — Assessment & Plan Note (Signed)

## 2013-01-12 NOTE — Assessment & Plan Note (Signed)
Urged to quit, declines chantix 

## 2013-01-12 NOTE — Progress Notes (Signed)
Subjective:    Patient ID: Jasmine [name], female    DOB: 01/20/1946, 67 y.o.   MRN: [id number]  HPI Here for wellness and f/u;  Overall doing ok;  Pt denies CP, wheezing, orthopnea, PND, palpitations, dizziness or syncope, but has had ongoing worsening sob/doe with activity room to room.  Was prescribed Home o2 at hospital d/c jan 2013 but only used for one month, as she thought cutting back on smoking (but not stopping) would obviate the need for Home o2.  Has also had one mo increased leg edema bilat as well, last Echo with EF normal - jan 2013  Pt denies neurological change such as new headache, facial or extremity weakness.  Pt denies polydipsia, polyuria, or low sugar symptoms. Pt states overall good compliance with treatment and medications, good tolerability, and has been trying to follow lower cholesterol diet.  Pt denies worsening depressive symptoms, suicidal ideation or panic. No fever, night sweats, wt loss, loss of appetite, or other constitutional symptoms.  Pt states good ability with ADL's, has low fall risk, home safety reviewed and adequate, no other significant changes in hearing or vision.  Due pneumovax Past Medical History  Diagnosis Date  . ALLERGIC RHINITIS 05/01/2007  . ASTHMA 05/01/2007  . BACK PAIN 10/10/2008  . DEPRESSION 07/21/2008  . FATIGUE 10/10/2010  . HYPERTENSION 05/01/2007  . HYPERTHYROIDISM 11/23/2007    Pt endorses having had Graves disease, possibly radioactive iodine x1, but no thyroidectomy  . HYPOTHYROIDISM 08/23/2009  . LUMBAR RADICULOPATHY, RIGHT 08/25/2008  . OSTEOPOROSIS 05/01/2007  . SHOULDER PAIN, LEFT 08/23/2009  . SINUSITIS, CHRONIC 10/10/2010  . SINUSITIS- ACUTE-NOS 10/10/2008   Past Surgical History  Procedure Laterality Date  . Appendectomy    . Ectopic pregnancy surgery    . Post fallopian tube removal right side    . History of multiple dialations and curettages and miscarriages, unfortunately never carrying a child to term    .  Electrocardiogram  06/20/2006    reports that she has been smoking Cigarettes.  She has a 30 pack-year smoking history. She does not have any smokeless tobacco history on file. She reports that  drinks alcohol. She reports that she uses illicit drugs (Cocaine). family history includes Alcohol abuse in her brother; Diabetes in her brother; Hypertension in her other; Lung cancer in her father; and Stroke in her mother. Allergies  Allergen Reactions  . Alendronate Sodium Other (See Comments)    unknown   Current Outpatient Prescriptions on File Prior to Visit  Medication Sig Dispense Refill  . albuterol (PROVENTIL HFA;VENTOLIN HFA) 108 (90 BASE) MCG/ACT inhaler Inhale 2 puffs into the lungs every 6 (six) hours as needed. For shortness of breath      . calcium carbonate (OS-CAL) 600 MG TABS Take 600 mg by mouth 2 (two) times daily.        . citalopram (CELEXA) 10 MG tablet Take 10 mg by mouth daily.      . Fluticasone-Salmeterol (ADVAIR DISKUS) 500-50 MCG/DOSE AEPB Inhale 1 puff into the lungs 2 (two) times daily.  180 each  3  . Guaifenesin (MUCINEX MAXIMUM STRENGTH) 1200 MG TB12 Take 1 tablet (1,200 mg total) by mouth 2 (two) times daily.  14 each  0  . levothyroxine (SYNTHROID, LEVOTHROID) 50 MCG tablet TAKE 1 TABLET EVERY DAY  90 tablet  2  . triamcinolone cream (KENALOG) 0.1 % Apply topically 2 (two) times daily.  30 g  0  . albuterol (ACCUNEB) 0.63 MG/3ML nebulizer solution Take  3 mLs (0.63 mg total) by nebulization every 6 (six) hours as needed for wheezing.  75 mL  12  . aspirin 81 MG EC tablet Take 81 mg by mouth daily.        . budesonide (PULMICORT) 0.25 MG/2ML nebulizer solution Take 2 mLs (0.25 mg total) by nebulization daily.  60 mL  12  . cetirizine (ZYRTEC) 10 MG tablet Take 10 mg by mouth daily.        . furosemide (LASIX) 20 MG tablet Take 1 tablet (20 mg total) by mouth daily as needed (for swelling).  30 tablet  6   No current facility-administered medications on file prior to  visit.   Review of Systems Constitutional: Negative for diaphoresis, activity change, appetite change or unexpected weight change.  HENT: Negative for hearing loss, ear pain, facial swelling, mouth sores and neck stiffness.   Eyes: Negative for pain, redness and visual disturbance.  Respiratory: Negative for shortness of breath and wheezing.   Cardiovascular: Negative for chest pain and palpitations.  Gastrointestinal: Negative for diarrhea, blood in stool, abdominal distention or other pain Genitourinary: Negative for hematuria, flank pain or change in urine volume.  Musculoskeletal: Negative for myalgias and joint swelling.  Skin: Negative for color change and wound.  Neurological: Negative for syncope and numbness. other than noted Hematological: Negative for adenopathy.  Psychiatric/Behavioral: Negative for hallucinations, self-injury, decreased concentration and agitation.      Objective:   Physical Exam BP 104/72  Pulse 88  Temp(Src) 97.7 F (36.5 C) (Oral)  Ht 5' (1.524 m)  Wt 103 lb 8 oz (46.947 kg)  BMI 20.21 kg/m2  SpO2 85% VS noted,  Constitutional: Pt is oriented to person, place, and time. Appears well-developed and well-nourished.  Head: Normocephalic and atraumatic.  Right Ear: External ear normal.  Left Ear: External ear normal.  Nose: Nose normal.  Mouth/Throat: Oropharynx is clear and moist.  Eyes: Conjunctivae and EOM are normal. Pupils are equal, round, and reactive to light.  Neck: Normal range of motion. Neck supple. No JVD present. No tracheal deviation present.  Cardiovascular: Normal rate, regular rhythm, normal heart sounds and intact distal pulses.   Pulmonary/Chest: Effort normal and breath decreased without wheeze.  Abdominal: Soft. Bowel sounds are normal. There is no tenderness. No HSM  Musculoskeletal: Normal range of motion. Exhibits no edema.  Lymphadenopathy:  Has no cervical adenopathy.  Neurological: Pt is alert and oriented to person,  place, and time. Pt has normal reflexes. No cranial nerve deficit. mtor intact Skin: Skin is warm and dry. No rash noted. 1-2+ Edema to knees Psychiatric:  Has  normal mood and affect. Behavior is normal.     Assessment & Plan:

## 2013-01-12 NOTE — Patient Instructions (Addendum)
OK to stop the triamterene fluid pill that was on your medication list if you are taking Please take all new medication as prescribed  - the lasix (furosemide) at 40 mg per day, and a potassium pill with it Please continue all other medications as before Please have the pharmacy call with any refills you may need. You had the pneumonia shot today You are otherwise up to date with prevention measures today. Please continue your efforts at being more active, and low cholesterol diet. Please see the PCC's before leaving today to have the oxygen ordered at 2L continuous You will be contacted regarding the referral for: pulmonary Please go to the XRAY Department in the Basement (go straight as you get off the elevator) for the x-ray testing Please go to the LAB in the Basement (turn left off the elevator) for the tests to be done today Please stop smoking Please return in 3 months, or sooner if needed

## 2013-01-12 NOTE — Assessment & Plan Note (Signed)
stable overall by history and exam, recent data reviewed with pt, and pt to continue medical treatment as before,  to f/u any worsening symptoms or concerns BP Readings from Last 3 Encounters:  01/12/13 104/72  08/18/12 108/72  11/27/11 110/62

## 2013-01-12 NOTE — Assessment & Plan Note (Signed)
Likely related to copd worsening, for lasix 40 qd, klor con 10 qd, f/u next visit

## 2013-01-12 NOTE — Assessment & Plan Note (Signed)
Mod to severe, cont meds, but needs Home 02 2L continuous, pneumovasc, CXR, refer pulmonary

## 2013-01-25 ENCOUNTER — Telehealth: Payer: Self-pay | Admitting: *Deleted

## 2013-01-25 NOTE — Telephone Encounter (Signed)
Left msg on triage requesting to speak with robin concerning medications...Raechel Chute

## 2013-01-26 NOTE — Telephone Encounter (Signed)
Called the patient and she wanted to confirm her understanding of MD instructions on meds. At 01/12/13 appointment.  She was instructed to stop Triamterene and start new furosemide 40 mg and potassium.  Patient was correct in following MD's instruction just needed to confirm instructions and that she was doing the right thing.

## 2013-01-26 NOTE — Telephone Encounter (Signed)
Called the patient left message to call back 

## 2013-02-03 ENCOUNTER — Other Ambulatory Visit (INDEPENDENT_AMBULATORY_CARE_PROVIDER_SITE_OTHER): Payer: Medicare Other

## 2013-02-03 ENCOUNTER — Encounter: Payer: Self-pay | Admitting: Pulmonary Disease

## 2013-02-03 ENCOUNTER — Ambulatory Visit (INDEPENDENT_AMBULATORY_CARE_PROVIDER_SITE_OTHER): Payer: Medicare Other | Admitting: Pulmonary Disease

## 2013-02-03 ENCOUNTER — Ambulatory Visit (INDEPENDENT_AMBULATORY_CARE_PROVIDER_SITE_OTHER)
Admission: RE | Admit: 2013-02-03 | Discharge: 2013-02-03 | Disposition: A | Discharge status: Home / Self Care | Payer: Medicare Other | Source: Ambulatory Visit | Attending: Pulmonary Disease | Admitting: Pulmonary Disease

## 2013-02-03 VITALS — BP 110/60 | HR 71 | Temp 97.7°F | Ht 60.0 in | Wt 107.2 lb

## 2013-02-03 DIAGNOSIS — J449 Chronic obstructive pulmonary disease, unspecified: Secondary | ICD-10-CM

## 2013-02-03 DIAGNOSIS — F17200 Nicotine dependence, unspecified, uncomplicated: Secondary | ICD-10-CM

## 2013-02-03 DIAGNOSIS — J4489 Other specified chronic obstructive pulmonary disease: Secondary | ICD-10-CM

## 2013-02-03 DIAGNOSIS — I2781 Cor pulmonale (chronic): Secondary | ICD-10-CM

## 2013-02-03 DIAGNOSIS — I279 Pulmonary heart disease, unspecified: Secondary | ICD-10-CM

## 2013-02-03 LAB — BASIC METABOLIC PANEL
BUN: 10 mg/dL (ref 6–23)
Calcium: 9.2 mg/dL (ref 8.4–10.5)
Creatinine, Ser: 1 mg/dL (ref 0.4–1.2)
GFR: 75.18 mL/min (ref >60.00)
Glucose, Bld: 87 mg/dL (ref 70–99)
Potassium: 5.1 mEq/L (ref 3.5–5.1)

## 2013-02-03 LAB — BRAIN NATRIURETIC PEPTIDE: Pro B Natriuretic peptide (BNP): 43 pg/mL (ref 0.0–100.0)

## 2013-02-03 MED ORDER — TIOTROPIUM BROMIDE MONOHYDRATE 18 MCG IN CAPS: 18.0000 ug | ORAL_CAPSULE | Freq: Every day | RESPIRATORY_TRACT | Status: DC

## 2013-02-03 NOTE — Assessment & Plan Note (Addendum)
He has severe COPD & emphysema from smoking Trial of spiriva  Stay on lasix daily  Spirometry -pre/post Pulm rehab in future

## 2013-02-03 NOTE — Progress Notes (Signed)
Subjective:    Patient ID: Jasmine [name], female    DOB: [birth year], 67 y.o.   MRN: [id number]  HPI 67 y.o smoker referred for evaluation of dyspnea. Pt states she has had asthma since she was a child. C/o SOB w/ exertion, occasional cough & wheeze when she gets around dust or pollen, wheezing and chest tx w/  activity as well.  She reports pedal edema x 1 month & has been started on lasix She was started on O2 in march 2014 & uses 2-3 liters 02 PRN and w/ sleep.  She has smoked  Since her 37's - about 77 Pyrs. She admits to using cocaine recreationally along with her husband (who is an amputee) & other family members. CT chest 10/2011 >> showed Moderate centrilobular emphysema. Small bilateral pleural effusions with dependent atelectasis  EKG - shows P pulmonale CXR - hyperinflation BMET - shows bicarb of 32    Past Medical History  Diagnosis Date  . ALLERGIC RHINITIS 05/01/2007  . ASTHMA 05/01/2007  . BACK PAIN 10/10/2008  . DEPRESSION 07/21/2008  . FATIGUE 10/10/2010  . HYPERTENSION 05/01/2007  . HYPERTHYROIDISM 11/23/2007    Pt endorses having had Graves disease, possibly radioactive iodine x1, but no thyroidectomy  . HYPOTHYROIDISM 08/23/2009  . LUMBAR RADICULOPATHY, RIGHT 08/25/2008  . OSTEOPOROSIS 05/01/2007  . SHOULDER PAIN, LEFT 08/23/2009  . SINUSITIS, CHRONIC 10/10/2010  . SINUSITIS- ACUTE-NOS 10/10/2008  . H/O blood clots     left leg 1960's   Past Surgical History  Procedure Laterality Date  . Appendectomy    . Ectopic pregnancy surgery    . Post fallopian tube removal right side    . History of multiple dialations and curettages and miscarriages, unfortunately never carrying a child to term    . Electrocardiogram  06/20/2006  . Tubal ligation      Allergies  Allergen Reactions  . Alendronate Sodium Other (See Comments)    unknown    History   Social History  . Marital Status: Married    Spouse Name: N/A    Number of Children: N/A  . Years of Education:  N/A   Occupational History  . RETIRED    Social History Main Topics  . Smoking status: Current Every Day Smoker -- 0.50 packs/day for 40 years    Types: Cigarettes  . Smokeless tobacco: Not on file  . Alcohol Use: Yes     Comment: 2 beers a day, occasional liquor, ? minimizing  . Drug Use: Yes    Special: Cocaine     Comment: Crack cocaine-sometimes weekends and holidays  . Sexually Active: Not on file   Other Topics Concern  . Not on file   Social History Narrative   Lives in [place], [place]. Lives with husband, no children, but husband has 3 kids. Ambulatory, without cane or walker. Smokes cigarettes, currently 1 ppd, smoked for >20-30 yrs. Drinks beer, ~2/day, occasionally liquor, but ? suspect minimizing. Occasional MJ and endorses crack cocaine occasionally    Family History  Problem Relation Age of Onset  . Alcohol abuse Brother   . Diabetes Brother   . Hypertension Other   . Lung cancer Father   . Stroke Mother   . Asthma      maternal aunts     Review of Systems  Constitutional: Positive for unexpected weight change. Negative for appetite change.  HENT: Negative for ear pain, congestion, sore throat, sneezing, trouble swallowing and dental problem.   Respiratory: Positive for shortness of  breath. Negative for cough.   Cardiovascular: Negative for chest pain, palpitations and leg swelling.  Gastrointestinal: Negative for abdominal pain.  Musculoskeletal: Positive for joint swelling.  Skin: Negative for rash.  Neurological: Negative for headaches.  Psychiatric/Behavioral: Positive for dysphoric mood. The patient is nervous/anxious.        Objective:   Physical Exam  Gen. Pleasant, well-nourished, in no distress, normal affect ENT - no lesions, no post nasal drip Neck: No JVD, no thyromegaly, no carotid bruits Lungs: no use of accessory muscles, no dullness to percussion, decreasd without rales or rhonchi  Cardiovascular: Rhythm regular, heart sounds   normal, no murmurs or gallops, 1+ peripheral edema Abdomen: soft and non-tender, no hepatosplenomegaly, BS normal. Musculoskeletal: No deformities, no cyanosis or clubbing Neuro:  alert, non focal        Assessment & Plan:

## 2013-02-03 NOTE — Patient Instructions (Addendum)
You have severe COPD & emphysema from smoking You have to quit smoking Trial of spiriva  CXR today Stay on lasix daily Blood work today - BMET, BNP Cut down salt You have to stop using drugs !

## 2013-02-07 NOTE — Assessment & Plan Note (Signed)
Smoking cessation paramount here

## 2013-02-07 NOTE — Assessment & Plan Note (Signed)
Blood work today - BMET, BNP Cut down salt Lasix daily

## 2013-02-09 ENCOUNTER — Other Ambulatory Visit: Payer: Self-pay | Admitting: Internal Medicine

## 2013-02-15 ENCOUNTER — Telehealth: Payer: Self-pay | Admitting: Pulmonary Disease

## 2013-02-15 DIAGNOSIS — J449 Chronic obstructive pulmonary disease, unspecified: Secondary | ICD-10-CM

## 2013-02-15 DIAGNOSIS — J45909 Unspecified asthma, uncomplicated: Secondary | ICD-10-CM

## 2013-02-15 NOTE — Telephone Encounter (Signed)
.  spoke with pt requesting to change DME to [facility] Per verbal with DR Vassie Loll this is ok . New order sent pt is aware

## 2013-02-16 ENCOUNTER — Other Ambulatory Visit: Payer: Self-pay | Admitting: *Deleted

## 2013-02-16 MED ORDER — ALBUTEROL SULFATE 0.63 MG/3ML IN NEBU: INHALATION_SOLUTION | RESPIRATORY_TRACT | Status: DC

## 2013-02-16 MED ORDER — BUDESONIDE 0.25 MG/2ML IN SUSP: 0.2500 mg | Freq: Two times a day (BID) | RESPIRATORY_TRACT | Status: DC

## 2013-03-01 ENCOUNTER — Ambulatory Visit (INDEPENDENT_AMBULATORY_CARE_PROVIDER_SITE_OTHER): Payer: Medicare Other | Admitting: Pulmonary Disease

## 2013-03-01 DIAGNOSIS — J4489 Other specified chronic obstructive pulmonary disease: Secondary | ICD-10-CM

## 2013-03-01 DIAGNOSIS — J449 Chronic obstructive pulmonary disease, unspecified: Secondary | ICD-10-CM

## 2013-03-01 LAB — PULMONARY FUNCTION TEST

## 2013-03-01 NOTE — Progress Notes (Signed)
Spirometry before and after done today. 

## 2013-03-02 ENCOUNTER — Telehealth: Payer: Self-pay | Admitting: Pulmonary Disease

## 2013-03-02 ENCOUNTER — Encounter: Payer: Self-pay | Admitting: Adult Health

## 2013-03-02 ENCOUNTER — Ambulatory Visit (INDEPENDENT_AMBULATORY_CARE_PROVIDER_SITE_OTHER): Payer: Medicare Other | Admitting: Adult Health

## 2013-03-02 VITALS — BP 96/54 | HR 73 | Temp 98.3°F | Ht 61.0 in | Wt 97.4 lb

## 2013-03-02 DIAGNOSIS — J449 Chronic obstructive pulmonary disease, unspecified: Secondary | ICD-10-CM

## 2013-03-02 MED ORDER — TIOTROPIUM BROMIDE MONOHYDRATE 18 MCG IN CAPS: 18.0000 ug | ORAL_CAPSULE | Freq: Every day | RESPIRATORY_TRACT | Status: DC

## 2013-03-02 NOTE — Telephone Encounter (Signed)
Jasmine [name]

## 2013-03-02 NOTE — Patient Instructions (Addendum)
Continue on Spiriva 1 puff daily  Continue on Pulmicort /Budesonide Neb Twice daily   May use Albuterol Neb every 6hr As needed  Wheezing / shortness of breath.  Must stop all drug use.  Stop smoking .  Follow up Dr. Vassie Loll  In 2 months and As needed

## 2013-03-03 ENCOUNTER — Telehealth: Payer: Self-pay | Admitting: Adult Health

## 2013-03-03 DIAGNOSIS — J449 Chronic obstructive pulmonary disease, unspecified: Secondary | ICD-10-CM

## 2013-03-03 DIAGNOSIS — J441 Chronic obstructive pulmonary disease with (acute) exacerbation: Secondary | ICD-10-CM

## 2013-03-03 NOTE — Telephone Encounter (Signed)
I spoke with zahara at [facility] and she states RX that was sent for albuterol 0.63%/13ml is not on their formulary but albuterol 0.083%/2.25mls is on the formulary and is it ok to switch to this med. I advised this changes was ok because this is what we usually prescribe and it looks like the pt has been on this in the past. Nothing further needed, med list updated. Carron Curie, CMA

## 2013-03-03 NOTE — Progress Notes (Signed)
Subjective:    Patient ID: Jasmine [name], female    DOB: 15-Oct-1946, 67 y.o.   MRN: [id number]  HPI 67 y.o smoker referred for evaluation of dyspnea.Seen for initial pulmonary eval 02/03/13   02/03/13 IOV  Pt states she has had asthma since she was a child. C/o SOB w/ exertion, occasional cough & wheeze when she gets around dust or pollen, wheezing and chest tx w/  activity as well.  She reports pedal edema x 1 month & has been started on lasix She was started on O2 in march 2014 & uses 2-3 liters 02 PRN and w/ sleep.  She has smoked  Since her 1's - about 51 Pyrs. She admits to using cocaine recreationally along with her husband (who is an amputee) & other family members. CT chest 10/2011 >> showed Moderate centrilobular emphysema. Small bilateral pleural effusions with dependent atelectasis  01/12/13 EKG - shows P pulmonale 02/03/13 CXR - hyperinflation 02/03/13 BMET - shows bicarb of 32  >>rec to quit smoking/drugs , Spiriva added   03/02/13 Follow up  3 week follow up COPD w/ PFT yesterday.  Reports doing well overall.  pt reports did very well with the spiriva, would like rx and discuss d/c advair. Pt unclear on meds . She says she is taknig pulmicort neb Twice daily   Was also taking advair but stopped when she started Spiriva.  Had PFT today >FEV1 at 0.47 L( 30%), ratio 39 , +BD response,  We discussed the severity of her COPD along w/ asthma component  Advised on dangers of continued smoking along with illegal drug use.  She verbalized understanding. Not ready to quit smoking but is cutting back.  No chest pain or edema.   Review of Systems Constitutional:   No  weight loss, night sweats,  Fevers, chills, fatigue, or  lassitude.  HEENT:   No headaches,  Difficulty swallowing,  Tooth/dental problems, or  Sore throat,                No sneezing, itching, ear ache, nasal congestion, post nasal drip,   CV:  No chest pain,  Orthopnea, PND, swelling in lower extremities, anasarca,  dizziness, palpitations, syncope.   GI  No heartburn, indigestion, abdominal pain, nausea, vomiting, diarrhea, change in bowel habits, loss of appetite, bloody stools.   Resp:   No coughing up of blood.  No change in color of mucus.  No wheezing.  No chest wall deformity  Skin: no rash or lesions.  GU: no dysuria, change in color of urine, no urgency or frequency.  No flank pain, no hematuria   MS:  No joint pain or swelling.  No decreased range of motion.  No back pain.  Psych:  No change in mood or affect. No depression or anxiety.  No memory loss.         Objective:   Physical Exam GEN: A/Ox3; pleasant , NAD, well nourished   HEENT:  [place]/AT,  EACs-clear, TMs-wnl, NOSE-clear, THROAT-clear, no lesions, no postnasal drip or exudate noted.   NECK:  Supple w/ fair ROM; no JVD; normal carotid impulses w/o bruits; no thyromegaly or nodules palpated; no lymphadenopathy.  RESP  Diminished BS in base  w/o, wheezes/ rales/ or rhonchi.no accessory muscle use, no dullness to percussion  CARD:  RRR, no m/r/g  , no peripheral edema, pulses intact, no cyanosis or clubbing.  GI:   Soft & nt; nml bowel sounds; no organomegaly or masses detected.  Musco: Warm bil,  no deformities or joint swelling noted.   Neuro: alert, no focal deficits noted.    Skin: Warm, no lesions or rashes         Assessment & Plan:

## 2013-03-03 NOTE — Telephone Encounter (Signed)
Pharmacy returned traige's phone call.  Jasmine [name]

## 2013-03-03 NOTE — Telephone Encounter (Signed)
Had a lengthy conversation with patient regarding her neb meds and O2 at yesterday's ov  Called [facility], spoke with [facility] who verified that pt is indeed getting budesonide 0.25mg  BID (just shipped on 5.13.14) and will receive albuterol neb this week (per 5.14.14 phone note).  Discussed with Ellise about pt's portable O2.  Pt had stated at ov that she had requested portable O2 (only has the concentrator and large cylinders at home) and the order was sent approx 1 month ago.  Per pt's chart, portable O2 order was sent at 1st ov w/ RA on 4.17.14 but the order was sent [facility] by [facility].  When pt was changed from [facility] for nebs to [facility] who already supplies her O2, the portable O2 order was not transferred.  Ellise stated that an order is needed to evaluate pt for portable O2 or an order to evaluate for portable concentrator (they are unable to do both within 1 order).  Called spoke with patient, had discussion with patient lasting >35mins about her nebulizer medications, how often to take the budesonide/spiriva/albuterol, why [facility] mails out the neb meds (she had difficulty understanding why [facility] mails when she used to pick this up at [facility]) and about needing an another order for the portable O2.  Pt aware our office will call her back once order is place and [facility] will call her to schedule eval time.  Dr Vassie Loll, please advise if you would like pt to have portable O2 system vs OCD (oxygen conserving device/concentrator).  Thanks.

## 2013-03-03 NOTE — Telephone Encounter (Signed)
Lmomtcb x 1 

## 2013-03-03 NOTE — Assessment & Plan Note (Signed)
Severe COPD  Encouraged on smoking cessation    Plan  Continue on Spiriva 1 puff daily  Continue on Pulmicort /Budesonide Neb Twice daily   May use Albuterol Neb every 6hr As needed  Wheezing / shortness of breath.  Must stop all drug use.  Stop smoking .  Follow up Dr. Vassie Loll  In 2 months and As needed

## 2013-03-04 NOTE — Telephone Encounter (Signed)
Evaluate for OCD

## 2013-03-04 NOTE — Telephone Encounter (Signed)
Order placed. Thecla Forgione, CMA  

## 2013-03-24 ENCOUNTER — Encounter: Payer: Self-pay | Admitting: Pulmonary Disease

## 2013-04-04 ENCOUNTER — Other Ambulatory Visit: Payer: Self-pay | Admitting: Internal Medicine

## 2013-04-12 ENCOUNTER — Other Ambulatory Visit: Payer: Self-pay | Admitting: Internal Medicine

## 2013-05-02 ENCOUNTER — Ambulatory Visit (INDEPENDENT_AMBULATORY_CARE_PROVIDER_SITE_OTHER): Payer: Medicare Other | Admitting: Pulmonary Disease

## 2013-05-02 ENCOUNTER — Encounter: Payer: Self-pay | Admitting: Pulmonary Disease

## 2013-05-02 VITALS — BP 100/58 | HR 91 | Temp 98.6°F | Ht 61.0 in | Wt 92.8 lb

## 2013-05-02 DIAGNOSIS — F17200 Nicotine dependence, unspecified, uncomplicated: Secondary | ICD-10-CM

## 2013-05-02 DIAGNOSIS — J449 Chronic obstructive pulmonary disease, unspecified: Secondary | ICD-10-CM

## 2013-05-02 NOTE — Assessment & Plan Note (Addendum)
STOP taking budesonide Resume advair twice daily - rinse mouth after use Stay on spiriva OK to resume zyrtec Use oxygen during exertion & sleep She will qualify for pulm rehab - just not sure if she is interested

## 2013-05-02 NOTE — Patient Instructions (Addendum)
STOP taking budesonide Resume advair twice daily - rinse mouth after use OK to resume zyrtec Use oxygen during exertion & sleep

## 2013-05-02 NOTE — Progress Notes (Signed)
  Subjective:    Patient ID: Jasmine [name], female    DOB: 1946/10/07, 67 y.o.   MRN: [id number]  HPI  67 y.o smoker for FU of COPD .Seen for initial pulmonary eval 02/03/13  02/03/13 IOV  Pt states she has had asthma since she was a child.  C/o SOB w/ exertion, occasional cough & wheeze when she gets around dust or pollen, wheezing and chest tx w/ activity as well.  She reports pedal edema x 1 month & has been started on lasix  She was started on O2 in march 2014 & uses 2-3 liters 02 PRN and w/ sleep.  She has smoked Since her 27's - about 56 Pyrs. She admits to using cocaine recreationally along with her husband (who is an amputee!) & other family members.  CT chest 10/2011 >> showed Moderate centrilobular emphysema. Small bilateral pleural effusions with dependent atelectasis  01/12/13 EKG - shows P pulmonale  02/03/13 CXR - hyperinflation  02/03/13 BMET - shows bicarb of 32   PFT  >FEV1 at 0.47 L( 30%), ratio 39 , No BD response (<200cc)    05/02/2013  Pt states her breathing is better. Leg swelling is better but left ankle still swelling. she coughs after using nebulizer medication. she has more coughing, wheezing, chest tx when she is outside.   pt reports did very well with the spiriva Pt unclear on meds . She says she is taknig pulmicort neb Twice daily  Was also taking advair but stopped when she started Spiriva.  Spiriva helps - less dyspnea Was supplied with portable O2 - OCD - Not ready to quit smoking but is cutting back. 4-6 cigs/d  Stopped advair & is not taking budesonide either  Review of Systems neg for any significant sore throat, dysphagia, itching, sneezing, nasal congestion or excess/ purulent secretions, fever, chills, sweats, unintended wt loss, pleuritic or exertional cp, hempoptysis, orthopnea pnd or change in chronic leg swelling. Also denies presyncope, palpitations, heartburn, abdominal pain, nausea, vomiting, diarrhea or change in bowel or urinary habits,  dysuria,hematuria, rash, arthralgias, visual complaints, headache, numbness weakness or ataxia.     Objective:   Physical Exam  Gen. Pleasant, thin woman, in no distress ENT - no lesions, no post nasal drip Neck: No JVD, no thyromegaly, no carotid bruits Lungs: no use of accessory muscles, no dullness to percussion, decreased without rales or rhonchi  Cardiovascular: Rhythm regular, heart sounds  normal, no murmurs or gallops, no peripheral edema Musculoskeletal: No deformities, no cyanosis or clubbing         Assessment & Plan:

## 2013-05-03 NOTE — Assessment & Plan Note (Signed)
Again emphasized cessation -

## 2013-05-06 ENCOUNTER — Telehealth: Payer: Self-pay | Admitting: Pulmonary Disease

## 2013-05-06 ENCOUNTER — Telehealth: Payer: Self-pay

## 2013-05-06 MED ORDER — TRIAMCINOLONE ACETONIDE 0.1 % EX CREA: TOPICAL_CREAM | Freq: Two times a day (BID) | CUTANEOUS | Status: DC

## 2013-05-06 MED ORDER — CETIRIZINE HCL 10 MG PO TABS: 10.0000 mg | ORAL_TABLET | Freq: Every day | ORAL | Status: DC

## 2013-05-06 NOTE — Telephone Encounter (Signed)
Refill done for the triam cream

## 2013-05-06 NOTE — Telephone Encounter (Signed)
Patient informed. 

## 2013-05-06 NOTE — Telephone Encounter (Signed)
Pt called stating that she was seen for a insect bite recently. She has ran out of ointment and would like to have a refill. Thanks

## 2013-05-06 NOTE — Telephone Encounter (Signed)
Dr. Vassie Loll does not mention calling in any cream in his note. He does state to restart zyrtec. I advised pt will send in RX for the zyrtec but she needs to call PCP for refill on her cream. She voiced her understanding and needed nothing further

## 2013-05-09 ENCOUNTER — Encounter: Payer: Self-pay | Admitting: Gastroenterology

## 2013-06-22 ENCOUNTER — Telehealth (HOSPITAL_COMMUNITY): Payer: Self-pay | Admitting: *Deleted

## 2013-06-22 NOTE — Telephone Encounter (Signed)
Unable to reach [name] by telephone regarding Pulm Rehab referral.  Letter mailed to patient.  Cathie Olden RN

## 2013-07-04 ENCOUNTER — Telehealth: Payer: Self-pay | Admitting: Pulmonary Disease

## 2013-07-04 NOTE — Telephone Encounter (Signed)
Called Pulm Rehab, spoke with Aurea Graff - Per Aurea Graff, pt qualifies for pulm rehab based on PFTs and dx of COPD Gold 3.   They are requesting we call pt's insurance to see if her insurance will cover pulm rehab.   Will forward to PCCs.  Please advise.  Thank you.

## 2013-07-05 NOTE — Telephone Encounter (Signed)
Called and spoke to [employer] at [facility] G0237, G0238, G0239, [phone] is a covered benefit and does not require pre cert for up to 36 sessions. After 36 sessions, prior authorization will be required. This is a covered benefit. Ref # [id number] FarrinL. Rhonda J Cobb

## 2013-07-11 ENCOUNTER — Encounter (HOSPITAL_COMMUNITY): Payer: Self-pay

## 2013-07-11 ENCOUNTER — Encounter (HOSPITAL_COMMUNITY)
Admission: RE | Admit: 2013-07-11 | Discharge: 2013-07-11 | Disposition: A | Discharge status: Home / Self Care | Payer: Medicare Other | Source: Ambulatory Visit | Attending: Pulmonary Disease | Admitting: Pulmonary Disease

## 2013-07-11 ENCOUNTER — Telehealth: Payer: Self-pay | Admitting: Internal Medicine

## 2013-07-11 DIAGNOSIS — Z0000 Encounter for general adult medical examination without abnormal findings: Secondary | ICD-10-CM

## 2013-07-11 NOTE — Telephone Encounter (Signed)
Pt request referral for colonoscopy and mammogram. Please advise.

## 2013-07-11 NOTE — Progress Notes (Signed)
Linton Flemings 67 y.o. female  Initial Psychosocial Assessment  Pt psychosocial assessment reveals pt lives with their spouse. 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 Pt hobbies include playing games on the computer and researching a topic on the computer. Pt reports her stress level is moderate. Areas of stress/anxiety include caring for her husband who is a left leg amputee.  She is responsible for all the driving, shopping, and upkeep of the house.  Her husband is very dependent on her and this is a major stressor.  She feels she cannot leave him alone. .  Pt does not exhibit  signs of depression.  Pt shows good  coping skills with positive   outlook .  Offered emotional support and reassurance. Monitor and evaluate progress toward psychosocial goal(s).  Patient would like to move into a retirement community which she thinks would be a positive influence on her husband, who does not make good decisions based on his group of friends who live nearby.  Her main support group are her 40 year old sister and niece.    Goal(s): Improved management of stress,  and assist her in navigating the assisted living route.  Smoking cessation More energy to do activities of daily living.   07/11/2013 2:32 PM

## 2013-07-11 NOTE — Progress Notes (Signed)
Patient arrived to pulmonary rehab for orientation today.  She drove herself and came alone.  She arrived on home oxygen and the tank was empty in no acute distress, oxygen saturation was 88%.  We placed patient on 2L of oxygen and saturations increased to 99%.  Breath sounds clear and equal.  Skin warm and dry, oriented to person, place, and time.  Balance good, grip strength equal.  Pulse regular, 1/2+ right pedal pulse present without edema, 1+ left pedal pulse present with 1+ pitting ankle edema.  Patient was oriented to the physical space and rules and regulations of the program.  She seems excited to join the program.  She is currently smoking about 3 cigarettes per day and would like to quit.  Smoking cessation counseling began.  Instructions completed regarding parking,  Clothing, and purse lip breathing.

## 2013-07-12 ENCOUNTER — Telehealth: Payer: Self-pay | Admitting: Pulmonary Disease

## 2013-07-12 DIAGNOSIS — F411 Generalized anxiety disorder: Secondary | ICD-10-CM

## 2013-07-12 NOTE — Telephone Encounter (Signed)
Called the patient informed of MD instructions. 

## 2013-07-12 NOTE — Telephone Encounter (Signed)
Mammogram do not normally require a referral  Ok for colonscopy since last was sept 2004

## 2013-07-12 NOTE — Telephone Encounter (Signed)
Defer to PCP - not sure whom to refer to -[facility] behaviour health?

## 2013-07-12 NOTE — Telephone Encounter (Signed)
Called left message to call back 

## 2013-07-12 NOTE — Telephone Encounter (Signed)
Spoke with Aurea Graff at Pulmonary Rehab-- Aurea Graff states patient has completed program, however would like to know if Dr. Vassie Loll would place a referral for patient to see a counselor or pyschiatrist. States patient is in a bad home situation dealing with her husband whom is an alcohol dependent amputee and also states patient mentioned something about crack cocaine in the home Per JOan she feels patient would really benefit if she had help to get her from her current home situation Dr. Vassie Loll please advise if you will place this order, or does she need to go through her PCP? Thank you

## 2013-07-12 NOTE — Telephone Encounter (Signed)
Ok for counseling referral 

## 2013-07-13 NOTE — Telephone Encounter (Signed)
Patient informed of referral

## 2013-07-14 ENCOUNTER — Telehealth (HOSPITAL_COMMUNITY): Payer: Self-pay | Admitting: Internal Medicine

## 2013-07-14 ENCOUNTER — Ambulatory Visit: Payer: Medicare Other

## 2013-07-14 ENCOUNTER — Encounter (HOSPITAL_COMMUNITY): Payer: Medicare Other

## 2013-07-16 ENCOUNTER — Other Ambulatory Visit: Payer: Self-pay | Admitting: Internal Medicine

## 2013-07-18 ENCOUNTER — Telehealth: Payer: Self-pay | Admitting: Pulmonary Disease

## 2013-07-18 MED ORDER — ALBUTEROL SULFATE HFA 108 (90 BASE) MCG/ACT IN AERS: 2.0000 | INHALATION_SPRAY | Freq: Four times a day (QID) | RESPIRATORY_TRACT | Status: DC | PRN

## 2013-07-18 NOTE — Telephone Encounter (Signed)
Pt aware rx has been sent. Nothing further needed 

## 2013-07-18 NOTE — Telephone Encounter (Signed)
Albuterol 2 puffs every 6h prn

## 2013-07-18 NOTE — Telephone Encounter (Signed)
I spoke with pt. She stated pulm rehab advised her she needs a rescue inhaler to have in b/w classes. She only has albuterol for nebulizer. Please advise if okay to send in albuterol RX and with what directions? thanks

## 2013-07-19 ENCOUNTER — Encounter (HOSPITAL_COMMUNITY): Payer: Medicare Other

## 2013-07-21 ENCOUNTER — Encounter (HOSPITAL_COMMUNITY)
Admission: RE | Admit: 2013-07-21 | Discharge: 2013-07-21 | Disposition: A | Discharge status: Home / Self Care | Payer: Medicare Other | Source: Ambulatory Visit | Attending: Pulmonary Disease | Admitting: Pulmonary Disease

## 2013-07-21 ENCOUNTER — Ambulatory Visit: Payer: Medicare Other

## 2013-07-21 ENCOUNTER — Other Ambulatory Visit: Payer: Self-pay | Admitting: Pulmonary Disease

## 2013-07-21 DIAGNOSIS — J441 Chronic obstructive pulmonary disease with (acute) exacerbation: Secondary | ICD-10-CM | POA: Insufficient documentation

## 2013-07-21 DIAGNOSIS — Z5189 Encounter for other specified aftercare: Secondary | ICD-10-CM | POA: Insufficient documentation

## 2013-07-26 ENCOUNTER — Encounter (HOSPITAL_COMMUNITY): Payer: Medicare Other

## 2013-07-26 ENCOUNTER — Telehealth (HOSPITAL_COMMUNITY): Payer: Self-pay | Admitting: Internal Medicine

## 2013-07-28 ENCOUNTER — Encounter (HOSPITAL_COMMUNITY)
Admission: RE | Admit: 2013-07-28 | Discharge: 2013-07-28 | Disposition: A | Discharge status: Home / Self Care | Payer: Medicare Other | Source: Ambulatory Visit | Attending: Pulmonary Disease | Admitting: Pulmonary Disease

## 2013-08-02 ENCOUNTER — Encounter (HOSPITAL_COMMUNITY)
Admission: RE | Admit: 2013-08-02 | Discharge: 2013-08-02 | Disposition: A | Discharge status: Home / Self Care | Payer: Medicare Other | Source: Ambulatory Visit | Attending: Pulmonary Disease | Admitting: Pulmonary Disease

## 2013-08-04 ENCOUNTER — Encounter (HOSPITAL_COMMUNITY)
Admission: RE | Admit: 2013-08-04 | Discharge: 2013-08-04 | Disposition: A | Discharge status: Home / Self Care | Payer: Medicare Other | Source: Ambulatory Visit | Attending: Pulmonary Disease | Admitting: Pulmonary Disease

## 2013-08-04 ENCOUNTER — Ambulatory Visit: Payer: Medicare Other | Admitting: Adult Health

## 2013-08-05 ENCOUNTER — Ambulatory Visit: Payer: Medicare Other | Admitting: Adult Health

## 2013-08-09 ENCOUNTER — Encounter (HOSPITAL_COMMUNITY): Payer: Medicare Other

## 2013-08-11 ENCOUNTER — Encounter (HOSPITAL_COMMUNITY)
Admission: RE | Admit: 2013-08-11 | Discharge: 2013-08-11 | Disposition: A | Discharge status: Home / Self Care | Payer: Medicare Other | Source: Ambulatory Visit | Attending: Pulmonary Disease | Admitting: Pulmonary Disease

## 2013-08-12 ENCOUNTER — Ambulatory Visit (INDEPENDENT_AMBULATORY_CARE_PROVIDER_SITE_OTHER): Payer: Medicare Other | Admitting: Adult Health

## 2013-08-12 ENCOUNTER — Encounter: Payer: Self-pay | Admitting: Adult Health

## 2013-08-12 VITALS — BP 122/74 | Temp 97.9°F | Ht 61.0 in | Wt 97.0 lb

## 2013-08-12 DIAGNOSIS — Z23 Encounter for immunization: Secondary | ICD-10-CM

## 2013-08-12 DIAGNOSIS — J449 Chronic obstructive pulmonary disease, unspecified: Secondary | ICD-10-CM

## 2013-08-12 DIAGNOSIS — J4489 Other specified chronic obstructive pulmonary disease: Secondary | ICD-10-CM

## 2013-08-12 NOTE — Patient Instructions (Signed)
Continue on Spiriva 1 puff daily  May use Ventolin HFA  2 puffs or Albuterol Neb every 6hr As needed  Wheezing / shortness of breath.  Stop smoking .  Follow up Dr. Vassie Loll  In 2 months and As needed   Flu shot today

## 2013-08-12 NOTE — Progress Notes (Signed)
  Subjective:    Patient ID: Jasmine [name], female    DOB: [birth year], 67 y.o.   MRN: [id number]  HPI   67 y.o smoker for FU of COPD .Seen for initial pulmonary eval 02/03/13   02/03/13 IOV  Pt states she has had asthma since she was a child.  C/o SOB w/ exertion, occasional cough & wheeze when she gets around dust or pollen, wheezing and chest tx w/ activity as well.  She reports pedal edema x 1 month & has been started on lasix  She was started on O2 in march 2014 & uses 2-3 liters 02 PRN and w/ sleep.  She has smoked Since her 83's - about 79 Pyrs. She admits to using cocaine recreationally along with her husband (who is an amputee!) & other family members.  CT chest 10/2011 >> showed Moderate centrilobular emphysema. Small bilateral pleural effusions with dependent atelectasis  01/12/13 EKG - shows P pulmonale  02/03/13 CXR - hyperinflation  02/03/13 BMET - shows bicarb of 32   PFT  >FEV1 at 0.47 L( 30%), ratio 39 , No BD response (<200cc)    05/02/13 Follow up  Pt states her breathing is better. Leg swelling is better but left ankle still swelling. she coughs after using nebulizer medication. she has more coughing, wheezing, chest tx when she is outside.   pt reports did very well with the spiriva Pt unclear on meds . She says she is taknig pulmicort neb Twice daily  Was also taking advair but stopped when she started Spiriva.  Spiriva helps - less dyspnea Was supplied with portable O2 - OCD - Not ready to quit smoking but is cutting back. 4-6 cigs/d  Stopped advair & is not taking budesonide either  08/12/2013 Follow up COPD  Returns for 3 month follow up COPD. Began Pulm Rehab 1 month ago.  would like flu shot today.  CAT = 16 Continues to smoke, advised on smoking cessation Continue to use cocaine "I have cut back" -advised on cessation.  No ER or hospital admits.  No chest pain , orthopnea , edema or hemotpysis.    Review of Systems  neg for any significant sore throat,  dysphagia, itching, sneezing, nasal congestion or excess/ purulent secretions, fever, chills, sweats, unintended wt loss, pleuritic or exertional cp, hempoptysis, orthopnea pnd or change in chronic leg swelling. Also denies presyncope, palpitations, heartburn, abdominal pain, nausea, vomiting, diarrhea or change in bowel or urinary habits, dysuria,hematuria, rash, arthralgias, visual complaints, headache, numbness weakness or ataxia.     Objective:   Physical Exam   Gen. Pleasant, thin woman, in no distress ENT - no lesions, no post nasal drip Neck: No JVD, no thyromegaly, no carotid bruits Lungs: no use of accessory muscles, no dullness to percussion, decreased without rales or rhonchi  Cardiovascular: Rhythm regular, heart sounds  normal, no murmurs or gallops, no peripheral edema Musculoskeletal: No deformities, no cyanosis or clubbing         Assessment & Plan:

## 2013-08-16 ENCOUNTER — Encounter (HOSPITAL_COMMUNITY)
Admission: RE | Admit: 2013-08-16 | Discharge: 2013-08-16 | Disposition: A | Discharge status: Home / Self Care | Payer: Medicare Other | Source: Ambulatory Visit | Attending: Pulmonary Disease | Admitting: Pulmonary Disease

## 2013-08-16 NOTE — Progress Notes (Signed)
Jasmine [name] 67 y.o. female  30 day  Psychosocial Note  Patient psychosocial assessment reveals her attendance has been erratic.  It seems either patient does not attend or arrives late.  She has been asked to try and improve her attendance. Psychosocial areas that are currently affecting patient's rehab experience include she is the caregiver for her husband who is an amputee and she states this is the reason for arriving late.  On 2 separate dates patient arrived to pulmonary rehab with her portable oxygen tank empty.  She is currently in contact with her home health supplier trying to get a larger portable tank so she may be able to be away from home longer.   Patient does continue to exhibit positive coping skills to deal with her psychosocial concerns. Offered emotional support and reassurance.  I made a referral for patient to see a counselor re: her husband health and living condition, and possibly moving to an assisted living facility.  She refused due to the inability to pay.  Patient does feel she is making progress toward Pulmonary Rehab goals. Patient reports her health and activity level has improved in the past 30 days as evidenced by patient's report of increased ability to be move active  And have more energy. She states that she is not exercising over the weekend and feels less energy the longer she goes without exercising.  After coming to the program on Tuesday she has more energy and has a more positive attitude which carries her through the rest of the week.  Then she exercises in the program Thursday which again gives her more energy. Patient reports  feeling positive about current and projected progression in Pulmonary Rehab. After reviewing the patient's treatment plan, the patient is making progress toward Pulmonary Rehab goals. Patient's rate of progress toward rehab goals is fair. Plan of action to help patient continue to work towards rehab goals include improving attendance by  giving support and understanding regarding the care of her husband. Will continue to monitor and evaluate progress toward psychosocial goals.  Goal(s) in progress: Increase workloads as appropriate Give support regarding home situation Help patient work toward returning to meaningful activities that improve patient's QOL and are attainable with patient's lung disease

## 2013-08-16 NOTE — Assessment & Plan Note (Signed)
Compensated  Encouraged on smoking cessation   Plan  Continue on Spiriva 1 puff daily  May use Ventolin HFA  2 puffs or Albuterol Neb every 6hr As needed  Wheezing / shortness of breath.  Stop smoking .  Follow up Dr. Vassie Loll  In 2 months and As needed   Flu shot today

## 2013-08-18 ENCOUNTER — Encounter (HOSPITAL_COMMUNITY): Admission: RE | Admit: 2013-08-18 | Payer: Medicare Other | Source: Ambulatory Visit

## 2013-08-23 ENCOUNTER — Encounter (HOSPITAL_COMMUNITY): Payer: Medicare Other

## 2013-08-23 ENCOUNTER — Telehealth (HOSPITAL_COMMUNITY): Payer: Self-pay | Admitting: Internal Medicine

## 2013-08-25 ENCOUNTER — Encounter (HOSPITAL_COMMUNITY)
Admission: RE | Admit: 2013-08-25 | Discharge: 2013-08-25 | Disposition: A | Discharge status: Home / Self Care | Payer: Medicare Other | Source: Ambulatory Visit | Attending: Pulmonary Disease | Admitting: Pulmonary Disease

## 2013-08-25 DIAGNOSIS — Z5189 Encounter for other specified aftercare: Secondary | ICD-10-CM | POA: Insufficient documentation

## 2013-08-25 DIAGNOSIS — J441 Chronic obstructive pulmonary disease with (acute) exacerbation: Secondary | ICD-10-CM | POA: Insufficient documentation

## 2013-08-26 ENCOUNTER — Other Ambulatory Visit: Payer: Self-pay | Admitting: Adult Health

## 2013-08-30 ENCOUNTER — Encounter (HOSPITAL_COMMUNITY)
Admission: RE | Admit: 2013-08-30 | Discharge: 2013-08-30 | Disposition: A | Discharge status: Home / Self Care | Payer: Medicare Other | Source: Ambulatory Visit | Attending: Pulmonary Disease | Admitting: Pulmonary Disease

## 2013-09-01 ENCOUNTER — Encounter (HOSPITAL_COMMUNITY): Payer: Medicare Other

## 2013-09-06 ENCOUNTER — Encounter (HOSPITAL_COMMUNITY): Payer: Medicare Other

## 2013-09-08 ENCOUNTER — Encounter (HOSPITAL_COMMUNITY): Payer: Medicare Other

## 2013-09-12 ENCOUNTER — Other Ambulatory Visit: Payer: Self-pay | Admitting: Pulmonary Disease

## 2013-09-13 ENCOUNTER — Encounter (HOSPITAL_COMMUNITY): Payer: Medicare Other

## 2013-09-15 ENCOUNTER — Encounter (HOSPITAL_COMMUNITY): Payer: Medicare Other

## 2013-09-19 ENCOUNTER — Encounter: Payer: Self-pay | Admitting: Internal Medicine

## 2013-09-19 NOTE — Progress Notes (Signed)
Jasmine [name] 67 y.o. female  10 day and Final Psychosocial Note  Patient psychosocial assessment reveals  barriers to participation in Pulmonary Rehab. 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  She is also in the process of selling her house and moving into an assisted living facility and has missed several exercise sessions, with the last being 08/30/2013.  Patient feels she has too many responsibilities  at home and cannot continue to participate in the program.  She feels she cannot progress unless she attends the classes regularly.  Patient does continue to exhibit positive coping skills to deal with  her psychosocial concerns.  She actually seemed to be in an uplifting mood because someone has made an offer on her house and looks forward to having some help in caring for her husband in the assisted living facility.   Offered emotional support and reassurance. Patient does not feel she is making progress toward Pulmonary Rehab goals because of her erratic attendance.  Patient only attended 7 exercise sessions and began the program 07/11/2013.  Jasmine [name] is aware if she would like to return to the program she would need a referral from Dr. Vassie Loll when her life settles down.

## 2013-09-20 ENCOUNTER — Encounter (HOSPITAL_COMMUNITY): Payer: Medicare Other

## 2013-09-22 ENCOUNTER — Encounter (HOSPITAL_COMMUNITY): Payer: Medicare Other

## 2013-09-24 ENCOUNTER — Other Ambulatory Visit: Payer: Self-pay | Admitting: Adult Health

## 2013-09-26 ENCOUNTER — Other Ambulatory Visit: Payer: Self-pay | Admitting: Internal Medicine

## 2013-09-26 NOTE — Telephone Encounter (Signed)
Refill done.  

## 2013-09-27 ENCOUNTER — Encounter (HOSPITAL_COMMUNITY): Payer: Medicare Other

## 2013-09-29 ENCOUNTER — Encounter (HOSPITAL_COMMUNITY): Payer: Medicare Other

## 2013-10-04 ENCOUNTER — Encounter (HOSPITAL_COMMUNITY): Payer: Medicare Other

## 2013-10-06 ENCOUNTER — Encounter (HOSPITAL_COMMUNITY): Payer: Medicare Other

## 2013-10-11 ENCOUNTER — Encounter (HOSPITAL_COMMUNITY): Payer: Medicare Other

## 2013-10-13 ENCOUNTER — Encounter (HOSPITAL_COMMUNITY): Payer: Medicare Other

## 2013-10-18 ENCOUNTER — Encounter (HOSPITAL_COMMUNITY): Payer: Medicare Other

## 2013-10-20 ENCOUNTER — Encounter (HOSPITAL_COMMUNITY): Payer: Medicare Other

## 2013-10-25 ENCOUNTER — Encounter (HOSPITAL_COMMUNITY): Payer: Medicare Other

## 2013-10-27 ENCOUNTER — Encounter (HOSPITAL_COMMUNITY): Payer: Medicare Other

## 2013-10-27 ENCOUNTER — Other Ambulatory Visit: Payer: Self-pay | Admitting: Pulmonary Disease

## 2013-10-27 ENCOUNTER — Ambulatory Visit: Payer: Medicare Other | Admitting: Adult Health

## 2013-10-28 ENCOUNTER — Encounter: Payer: Self-pay | Admitting: Adult Health

## 2013-12-31 ENCOUNTER — Other Ambulatory Visit: Payer: Self-pay | Admitting: Pulmonary Disease

## 2014-01-02 ENCOUNTER — Other Ambulatory Visit: Payer: Self-pay | Admitting: Internal Medicine

## 2014-01-10 ENCOUNTER — Other Ambulatory Visit: Payer: Self-pay | Admitting: Internal Medicine

## 2014-01-24 ENCOUNTER — Telehealth: Payer: Self-pay | Admitting: Pulmonary Disease

## 2014-01-24 DIAGNOSIS — J449 Chronic obstructive pulmonary disease, unspecified: Secondary | ICD-10-CM

## 2014-01-24 NOTE — Telephone Encounter (Signed)
Spoke with [facility] and is aware order has been placed. Nothing further needed

## 2014-01-30 ENCOUNTER — Other Ambulatory Visit: Payer: Self-pay | Admitting: Internal Medicine

## 2014-02-05 ENCOUNTER — Other Ambulatory Visit: Payer: Self-pay | Admitting: Internal Medicine

## 2014-03-12 ENCOUNTER — Other Ambulatory Visit: Payer: Self-pay | Admitting: Pulmonary Disease

## 2014-03-18 ENCOUNTER — Other Ambulatory Visit: Payer: Self-pay | Admitting: Pulmonary Disease

## 2014-03-23 ENCOUNTER — Telehealth: Payer: Self-pay | Admitting: *Deleted

## 2014-03-23 ENCOUNTER — Telehealth: Payer: Self-pay | Admitting: Pulmonary Disease

## 2014-03-23 ENCOUNTER — Telehealth: Payer: Self-pay

## 2014-03-23 DIAGNOSIS — J449 Chronic obstructive pulmonary disease, unspecified: Secondary | ICD-10-CM

## 2014-03-23 NOTE — Telephone Encounter (Signed)
Pt called and stated that her [place] who provides her oxygen sent her a letter stating that they would no longer be in her network.  Pt would need to change companies.  I informed the patient that she would need to speak with her pulmonologist office in order to get a new oxygen provider company.  She is a pt of Dr. Elsworth Soho with [facility]

## 2014-03-23 NOTE — Telephone Encounter (Signed)
Pt called earlier and needed to be set up with new DME since [employer] no longer covers [facility].   Pt requested that her order be sent to [facility] but Corene Cornea stated that the only DME company that will accept Mcarth[place] Rossetti is [facility].  i attempted to call pt to make her aware but her husband stated that she was in the bathroom and to call back later.  Will try her back tomorrow to make her aware.

## 2014-03-23 NOTE — Telephone Encounter (Signed)
Called and spoke with pt and she stated that Jasmine [name] will no longer cover for her to use [facility] for her oxgyen.  Pt is requesting to change to [facility].  Order has been placed and message sent to [facility].  Pt is aware that this has been done and nothing further is needed.

## 2014-03-28 NOTE — Telephone Encounter (Signed)
Called [employer]. Spoke with Harper. She reports they sent [employer] form w/ O2 on it for SN to sign on 03/24/14. She will fax this to triage. I will await fax. Pt aware. Will forward to Leigh to f/u on

## 2014-03-28 NOTE — Telephone Encounter (Signed)
Pt is asking to see what the status of her DME change to [name] might be for O2.  Pt can be reached at [phone].  Jasmine [name]

## 2014-03-28 NOTE — Telephone Encounter (Signed)
General 03/23/2014 4:52 PM [name], Jasmine J        Note    Order faxed to [facility] to arrange to supply patient with o2 . Jasmine [name]  ---  Called [employer] and received answering service wcb

## 2014-04-03 ENCOUNTER — Other Ambulatory Visit: Payer: Self-pay | Admitting: Pulmonary Disease

## 2014-04-03 ENCOUNTER — Other Ambulatory Visit: Payer: Self-pay | Admitting: Internal Medicine

## 2014-04-05 ENCOUNTER — Other Ambulatory Visit: Payer: Self-pay | Admitting: Internal Medicine

## 2014-05-08 ENCOUNTER — Other Ambulatory Visit: Payer: Self-pay | Admitting: Pulmonary Disease

## 2014-05-12 ENCOUNTER — Other Ambulatory Visit: Payer: Self-pay | Admitting: *Deleted

## 2014-05-12 MED ORDER — CETIRIZINE HCL 10 MG PO TABS: ORAL_TABLET | ORAL | Status: DC

## 2014-05-18 ENCOUNTER — Encounter: Payer: Self-pay | Admitting: Gastroenterology

## 2014-05-26 ENCOUNTER — Other Ambulatory Visit: Payer: Self-pay | Admitting: Internal Medicine

## 2014-05-27 ENCOUNTER — Other Ambulatory Visit: Payer: Self-pay | Admitting: Internal Medicine

## 2014-05-28 ENCOUNTER — Other Ambulatory Visit: Payer: Self-pay | Admitting: Internal Medicine

## 2014-05-29 ENCOUNTER — Other Ambulatory Visit: Payer: Self-pay | Admitting: Internal Medicine

## 2014-06-13 ENCOUNTER — Other Ambulatory Visit: Payer: Self-pay | Admitting: Pulmonary Disease

## 2014-06-18 ENCOUNTER — Encounter (HOSPITAL_COMMUNITY): Payer: Self-pay | Admitting: Emergency Medicine

## 2014-06-18 ENCOUNTER — Emergency Department (HOSPITAL_COMMUNITY)
Admission: EM | Admit: 2014-06-18 | Discharge: 2014-06-19 | Disposition: A | Discharge status: Home / Self Care | Payer: Medicare HMO | Attending: Emergency Medicine | Admitting: Emergency Medicine

## 2014-06-18 ENCOUNTER — Emergency Department (HOSPITAL_COMMUNITY): Payer: Medicare HMO

## 2014-06-18 DIAGNOSIS — R0902 Hypoxemia: Secondary | ICD-10-CM | POA: Diagnosis not present

## 2014-06-18 DIAGNOSIS — E039 Hypothyroidism, unspecified: Secondary | ICD-10-CM | POA: Insufficient documentation

## 2014-06-18 DIAGNOSIS — F3289 Other specified depressive episodes: Secondary | ICD-10-CM | POA: Insufficient documentation

## 2014-06-18 DIAGNOSIS — Z862 Personal history of diseases of the blood and blood-forming organs and certain disorders involving the immune mechanism: Secondary | ICD-10-CM | POA: Diagnosis not present

## 2014-06-18 DIAGNOSIS — R5381 Other malaise: Secondary | ICD-10-CM | POA: Insufficient documentation

## 2014-06-18 DIAGNOSIS — Z8639 Personal history of other endocrine, nutritional and metabolic disease: Secondary | ICD-10-CM | POA: Insufficient documentation

## 2014-06-18 DIAGNOSIS — Z86718 Personal history of other venous thrombosis and embolism: Secondary | ICD-10-CM | POA: Diagnosis not present

## 2014-06-18 DIAGNOSIS — F17200 Nicotine dependence, unspecified, uncomplicated: Secondary | ICD-10-CM | POA: Diagnosis not present

## 2014-06-18 DIAGNOSIS — Z79899 Other long term (current) drug therapy: Secondary | ICD-10-CM | POA: Insufficient documentation

## 2014-06-18 DIAGNOSIS — R5383 Other fatigue: Secondary | ICD-10-CM | POA: Diagnosis present

## 2014-06-18 DIAGNOSIS — F329 Major depressive disorder, single episode, unspecified: Secondary | ICD-10-CM | POA: Diagnosis not present

## 2014-06-18 DIAGNOSIS — E0590 Thyrotoxicosis, unspecified without thyrotoxic crisis or storm: Secondary | ICD-10-CM | POA: Insufficient documentation

## 2014-06-18 DIAGNOSIS — I10 Essential (primary) hypertension: Secondary | ICD-10-CM | POA: Insufficient documentation

## 2014-06-18 DIAGNOSIS — J449 Chronic obstructive pulmonary disease, unspecified: Secondary | ICD-10-CM

## 2014-06-18 DIAGNOSIS — Z8709 Personal history of other diseases of the respiratory system: Secondary | ICD-10-CM | POA: Insufficient documentation

## 2014-06-18 DIAGNOSIS — J4489 Other specified chronic obstructive pulmonary disease: Secondary | ICD-10-CM | POA: Insufficient documentation

## 2014-06-18 DIAGNOSIS — R55 Syncope and collapse: Secondary | ICD-10-CM | POA: Insufficient documentation

## 2014-06-18 DIAGNOSIS — Z9981 Dependence on supplemental oxygen: Secondary | ICD-10-CM

## 2014-06-18 LAB — CBC WITH DIFFERENTIAL/PLATELET
BASOS ABS: 0 10*3/uL (ref 0.0–0.1)
BASOS PCT: 0 % (ref 0–1)
EOS PCT: 2 % (ref 0–5)
Eosinophils Absolute: 0.1 10*3/uL (ref 0.0–0.7)
HCT: 42.3 % (ref 36.0–46.0)
Hemoglobin: 12.7 g/dL (ref 12.0–15.0)
LYMPHS ABS: 1.3 10*3/uL (ref 0.7–4.0)
Lymphocytes Relative: 20 % (ref 12–46)
MCH: 32.2 pg (ref 26.0–34.0)
MCHC: 30 g/dL (ref 30.0–36.0)
MCV: 107.4 fL — ABNORMAL HIGH (ref 78.0–100.0)
Monocytes Absolute: 0.7 10*3/uL (ref 0.1–1.0)
Monocytes Relative: 10 % (ref 3–12)
Neutro Abs: 4.6 10*3/uL (ref 1.7–7.7)
Neutrophils Relative %: 68 % (ref 43–77)
PLATELETS: 122 10*3/uL — AB (ref 150–400)
RBC: 3.94 MIL/uL (ref 3.87–5.11)
RDW: 14.1 % (ref 11.5–15.5)
WBC: 6.8 10*3/uL (ref 4.0–10.5)

## 2014-06-18 LAB — COMPREHENSIVE METABOLIC PANEL
ALT: 19 U/L (ref 0–35)
AST: 28 U/L (ref 0–37)
Albumin: 3.5 g/dL (ref 3.5–5.2)
Alkaline Phosphatase: 70 U/L (ref 39–117)
Anion gap: 3 — ABNORMAL LOW (ref 5–15)
BUN: 17 mg/dL (ref 6–23)
CALCIUM: 9.4 mg/dL (ref 8.4–10.5)
CO2: 44 mEq/L (ref 19–32)
Chloride: 97 mEq/L (ref 96–112)
Creatinine, Ser: 0.79 mg/dL (ref 0.50–1.10)
GFR calc non Af Amer: 83 mL/min — ABNORMAL LOW (ref >90)
GLUCOSE: 104 mg/dL — AB (ref 70–99)
Potassium: 5.2 mEq/L (ref 3.7–5.3)
SODIUM: 144 meq/L (ref 137–147)
Total Bilirubin: 0.4 mg/dL (ref 0.3–1.2)
Total Protein: 6.8 g/dL (ref 6.0–8.3)

## 2014-06-18 MED ORDER — SODIUM CHLORIDE 0.9 % IV SOLN: INTRAVENOUS | Status: DC

## 2014-06-18 NOTE — ED Notes (Signed)
Bed: WA07 Expected date:  Expected time:  Means of arrival:  Comments: EMS 9F near syncope

## 2014-06-18 NOTE — ED Notes (Signed)
Pt presents from home, report of weakness and earlier today she was "hard to wake up." Pt out of antihypertensives and family wanted her evaluated.

## 2014-06-18 NOTE — ED Notes (Signed)
Pt is aware of the need for urine sample.  

## 2014-06-19 ENCOUNTER — Telehealth: Payer: Self-pay | Admitting: Pulmonary Disease

## 2014-06-19 DIAGNOSIS — R55 Syncope and collapse: Secondary | ICD-10-CM | POA: Diagnosis not present

## 2014-06-19 LAB — TROPONIN I

## 2014-06-19 LAB — BLOOD GAS, ARTERIAL
Acid-Base Excess: 12.8 mmol/L — ABNORMAL HIGH (ref 0.0–2.0)
BICARBONATE: 44.2 meq/L — AB (ref 20.0–24.0)
Drawn by: 422461
Drawn by: 422461
O2 Content: 4 L/min
O2 Content: 6 L/min
O2 SAT: 98.2 %
O2 Saturation: 95.4 %
PCO2 ART: 107 mmHg — AB (ref 35.0–45.0)
PH ART: 7.217 — AB (ref 7.350–7.450)
PH ART: 7.239 — AB (ref 7.350–7.450)
Patient temperature: 98.6
Patient temperature: 99.4
TCO2: 41.5 mmol/L (ref 0–100)
pO2, Arterial: 123 mmHg — ABNORMAL HIGH (ref 80.0–100.0)
pO2, Arterial: 82.3 mmHg (ref 80.0–100.0)

## 2014-06-19 LAB — URINALYSIS, ROUTINE W REFLEX MICROSCOPIC
Bilirubin Urine: NEGATIVE
GLUCOSE, UA: 100 mg/dL — AB
HGB URINE DIPSTICK: NEGATIVE
Ketones, ur: NEGATIVE mg/dL
LEUKOCYTES UA: NEGATIVE
Nitrite: NEGATIVE
PH: 6 (ref 5.0–8.0)
PROTEIN: 30 mg/dL — AB
Specific Gravity, Urine: 1.026 (ref 1.005–1.030)
Urobilinogen, UA: 0.2 mg/dL (ref 0.0–1.0)

## 2014-06-19 LAB — URINE MICROSCOPIC-ADD ON

## 2014-06-19 LAB — ETHANOL: Alcohol, Ethyl (B): <11 mg/dL (ref 0–11)

## 2014-06-19 NOTE — Telephone Encounter (Signed)
Ok to double book with me

## 2014-06-19 NOTE — ED Notes (Addendum)
Unable to leave hospital at this time. Does not have supplemental Oxygen for discharge. Refuses to go by [facility]. Pt family member went home for O2 tank. Production assistant, radio.

## 2014-06-19 NOTE — Telephone Encounter (Signed)
lmtcb x1 w/ family member  

## 2014-06-19 NOTE — Telephone Encounter (Signed)
Per RA schedule appt w/ TP for post ED f/u Called and LMTCB x2

## 2014-06-19 NOTE — Telephone Encounter (Signed)
Pt with hx severe COPD on home oxygen, followed by Dr. Elsworth Soho.  Pt brought to ER with syncope.  Per report EMS found her oxygen tubing tied in knots.  Noted to have hypercapnia in ER.  Mental status and acidosis improved after adjustment in oxygen.  CXR unremarkable.  Dr. Sharol Given advised that pt appeared stable for d/c from ER, but needed follow up as acute visit in office for 06/19/14 >> can be with any available provider.    Will route to Triage to contact pt to schedule acute visit for 06/19/14.

## 2014-06-19 NOTE — ED Provider Notes (Signed)
CSN: [id number]     Arrival date & time 06/18/14  2111 History   First MD Initiated Contact with Patient 06/18/14 2304     Chief Complaint  Patient presents with  . Weakness     (Consider location/radiation/quality/duration/timing/severity/associated sxs/prior Treatment) HPI 68 year old female presents to the emergency department from home with complaint of excessive sleepiness and syncope today.  Family reports that they were having difficulty with her falling asleep earlier today.  Patient reports that she just felt tired.  Tonight around 5 PM patient was walking to the door when her grandson saw her Get weak.  EMS reports upon their arrival patient had several knots in her oxygen cord.  They report that her oxygen was not functioning.  No prior history of syncope.  Patient has history of COPD on oxygen 6 L at all times.   Past Medical History  Diagnosis Date  . ALLERGIC RHINITIS 05/01/2007  . ASTHMA 05/01/2007  . BACK PAIN 10/10/2008  . DEPRESSION 07/21/2008  . FATIGUE 10/10/2010  . HYPERTENSION 05/01/2007  . HYPERTHYROIDISM 11/23/2007    Pt endorses having had Graves disease, possibly radioactive iodine x1, but no thyroidectomy  . HYPOTHYROIDISM 08/23/2009  . LUMBAR RADICULOPATHY, RIGHT 08/25/2008  . OSTEOPOROSIS 05/01/2007  . SHOULDER PAIN, LEFT 08/23/2009  . SINUSITIS, CHRONIC 10/10/2010  . SINUSITIS- ACUTE-NOS 10/10/2008  . H/O blood clots     left leg 1960's   Past Surgical History  Procedure Laterality Date  . Appendectomy    . Ectopic pregnancy surgery    . Post fallopian tube removal right side    . History of multiple dialations and curettages and miscarriages, unfortunately never carrying a child to term    . Electrocardiogram  06/20/2006  . Tubal ligation     Family History  Problem Relation Age of Onset  . Alcohol abuse Brother   . Diabetes Brother   . Hypertension Other   . Lung cancer Father   . Stroke Mother   . Asthma      maternal aunts   History  Substance  Use Topics  . Smoking status: Current Every Day Smoker -- 0.05 packs/day for 40 years    Types: Cigarettes  . Smokeless tobacco: Not on file  . Alcohol Use: 1.2 oz/week    2 Cans of beer per week     Comment: 2 beers a day, occasional liquor, ? minimizing   OB History   Grav Para Term Preterm Abortions TAB SAB Ect Mult Living                 Review of Systems   See History of Present Illness; otherwise all other systems are reviewed and negative  Allergies  Alendronate sodium  Home Medications   Prior to Admission medications   Medication Sig Start Date End Date Taking? Authorizing Provider  albuterol (PROVENTIL HFA;VENTOLIN HFA) 108 (90 BASE) MCG/ACT inhaler Inhale 2 puffs into the lungs every 6 (six) hours as needed for wheezing or shortness of breath.   Yes Historical Provider, MD  albuterol (PROVENTIL) (2.5 MG/3ML) 0.083% nebulizer solution Take 2.5 mg by nebulization every 6 (six) hours as needed for wheezing.   Yes Historical Provider, MD  cetirizine (ZYRTEC) 10 MG tablet Take 10 mg by mouth daily.   Yes Historical Provider, MD  citalopram (CELEXA) 10 MG tablet Take 10 mg by mouth daily.   Yes Historical Provider, MD  furosemide (LASIX) 40 MG tablet Take 40 mg by mouth daily.   Yes Historical Provider,  MD  levothyroxine (SYNTHROID, LEVOTHROID) 50 MCG tablet Take 50 mcg by mouth daily before breakfast.   Yes Historical Provider, MD  naproxen sodium (ANAPROX) 220 MG tablet Take 220 mg by mouth 2 (two) times daily with a meal.   Yes Historical Provider, MD  potassium chloride (K-DUR) 10 MEQ tablet Take 10 mEq by mouth 2 (two) times daily.   Yes Historical Provider, MD  tiotropium (SPIRIVA) 18 MCG inhalation capsule Place 18 mcg into inhaler and inhale daily.   Yes Historical Provider, MD  triamcinolone cream (KENALOG) 0.1 % Apply 1 application topically 2 (two) times daily as needed ("break out on arms").   Yes Historical Provider, MD   BP 131/61  Pulse 92  Temp(Src) 99.4 F  (37.4 C) (Oral)  Resp 18  SpO2 97% Physical Exam  Nursing note and vitals reviewed. Constitutional: She is oriented to person, place, and time. She appears well-developed and well-nourished.  HENT:  Head: Normocephalic and atraumatic.  Nose: Nose normal.  Mouth/Throat: Oropharynx is clear and moist.  Eyes: Conjunctivae and EOM are normal. Pupils are equal, round, and reactive to light.  Neck: Normal range of motion. Neck supple. No JVD present. No tracheal deviation present. No thyromegaly present.  Cardiovascular: Normal rate, regular rhythm, normal heart sounds and intact distal pulses.  Exam reveals no gallop and no friction rub.   No murmur heard. Pulmonary/Chest: Effort normal and breath sounds normal. No stridor. No respiratory distress. She has no wheezes. She has no rales. She exhibits no tenderness.  Abdominal: Soft. Bowel sounds are normal. She exhibits no distension and no mass. There is no tenderness. There is no rebound and no guarding.  Musculoskeletal: Normal range of motion. She exhibits no edema and no tenderness.  Lymphadenopathy:    She has no cervical adenopathy.  Neurological: She is alert and oriented to person, place, and time. No cranial nerve deficit. She exhibits normal muscle tone. Coordination normal.  Skin: Skin is warm and dry. No rash noted. No erythema. No pallor.  Psychiatric: She has a normal mood and affect. Her behavior is normal. Judgment and thought content normal.    ED Course  Procedures (including critical care time) Labs Review Labs Reviewed  URINALYSIS, ROUTINE W REFLEX MICROSCOPIC - Abnormal; Notable for the following:    Glucose, UA 100 (*)    Protein, ur 30 (*)    All other components within normal limits  CBC WITH DIFFERENTIAL - Abnormal; Notable for the following:    MCV 107.4 (*)    Platelets 122 (*)    All other components within normal limits  COMPREHENSIVE METABOLIC PANEL - Abnormal; Notable for the following:    CO2 44 (*)     Glucose, Bld 104 (*)    GFR calc non Af Amer 83 (*)    Anion gap 3 (*)    All other components within normal limits  BLOOD GAS, ARTERIAL - Abnormal; Notable for the following:    pH, Arterial 7.217 (*)    pO2, Arterial 123.0 (*)    All other components within normal limits  BLOOD GAS, ARTERIAL - Abnormal; Notable for the following:    pH, Arterial 7.239 (*)    pCO2 arterial 107.0 (*)    Bicarbonate 44.2 (*)    Acid-Base Excess 12.8 (*)    All other components within normal limits  URINE MICROSCOPIC-ADD ON - Abnormal; Notable for the following:    Bacteria, UA FEW (*)    Casts HYALINE CASTS (*)  All other components within normal limits  URINE CULTURE  ETHANOL  TROPONIN I    Imaging Review Dg Chest 2 View  06/18/2014   CLINICAL DATA:  pain  EXAM: CHEST - 2 VIEW  COMPARISON:  02/03/2013  FINDINGS: Coarse bilateral interstitial markings. No confluent airspace infiltrate or overt edema. Chronic blunting of posterior left costophrenic angle. Heart size upper limits normal. Tortuous thoracic aorta. Regional bones unremarkable.  IMPRESSION: Stable hyperinflation and chronic interstitial changes, without acute or superimposed abnormality.   Electronically Signed   By: Arne Cleveland M.D.   On: 06/18/2014 23:03     EKG Interpretation   Date/Time:  Sunday June 18 2014 21:13:52 EDT Ventricular Rate:  82 PR Interval:  146 QRS Duration: 81 QT Interval:  349 QTC Calculation: 407 R Axis:   86 Text Interpretation:  Sinus rhythm Ventricular premature complex Biatrial  enlargement Probable anterior infarct, age indeterminate T wave inversion  has lessened from prior Confirmed by Primitivo Merkey  MD, Tristyn Pharris ([id number]) on 06/18/2014  11:14:10 PM      MDM   Final diagnoses:  Syncope and collapse  Chronic obstructive pulmonary disease, unspecified COPD, unspecified chronic bronchitis type  Hypoxemia requiring supplemental oxygen    68 year old female with syncope, possible secondary to hypoxia as  her option to it was reported to have several knots in it restricting her access to oxygen.  We'll get blood gas for further evaluation.  1:35 AM patient's blood gas shows carbon dioxide greater than able to be measured.  Patient shows no signs of hypercarbia, however.  We'll decrease oxygen slightly and recheck ABG.    2:52 AM Improved PCO2, but still high.  D/w Dr Halford Chessman on call for Dr Elsworth Soho.  He will have her seen in the office this week.  Suspected baseline hypercarbia as evidenced by her elevated bicarb.  Kalman Drape, MD 06/19/14 [phone]

## 2014-06-19 NOTE — Discharge Instructions (Signed)
Monitor your oxygen tubing to make sure it is not obstructed or kinked.  Follow up with your pulmonologist this week.   Chronic Obstructive Pulmonary Disease Chronic obstructive pulmonary disease (COPD) is a common lung condition in which airflow from the lungs is limited. COPD is a general term that can be used to describe many different lung problems that limit airflow, including both chronic bronchitis and emphysema. If you have COPD, your lung function will probably never return to normal, but there are measures you can take to improve lung function and make yourself feel better.  CAUSES   Smoking (common).   Exposure to secondhand smoke.   Genetic problems.  Chronic inflammatory lung diseases or recurrent infections. SYMPTOMS   Shortness of breath, especially with physical activity.   Deep, persistent (chronic) cough with a large amount of thick mucus.   Wheezing.   Rapid breaths (tachypnea).   Gray or bluish discoloration (cyanosis) of the skin, especially in fingers, toes, or lips.   Fatigue.   Weight loss.   Frequent infections or episodes when breathing symptoms become much worse (exacerbations).   Chest tightness. DIAGNOSIS  Your health care provider will take a medical history and perform a physical examination to make the initial diagnosis. Additional tests for COPD may include:   Lung (pulmonary) function tests.  Chest X-ray.  CT scan.  Blood tests. TREATMENT  Treatment available to help you feel better when you have COPD includes:   Inhaler and nebulizer medicines. These help manage the symptoms of COPD and make your breathing more comfortable.  Supplemental oxygen. Supplemental oxygen is only helpful if you have a low oxygen level in your blood.   Exercise and physical activity. These are beneficial for nearly all people with COPD. Some people may also benefit from a pulmonary rehabilitation program. HOME CARE INSTRUCTIONS   Take all  medicines (inhaled or pills) as directed by your health care provider.  Avoid over-the-counter medicines or cough syrups that dry up your airway (such as antihistamines) and slow down the elimination of secretions unless instructed otherwise by your health care provider.   If you are a smoker, the most important thing that you can do is stop smoking. Continuing to smoke will cause further lung damage and breathing trouble. Ask your health care provider for help with quitting smoking. He or she can direct you to community resources or hospitals that provide support.  Avoid exposure to irritants such as smoke, chemicals, and fumes that aggravate your breathing.  Use oxygen therapy and pulmonary rehabilitation if directed by your health care provider. If you require home oxygen therapy, ask your health care provider whether you should purchase a pulse oximeter to measure your oxygen level at home.   Avoid contact with individuals who have a contagious illness.  Avoid extreme temperature and humidity changes.  Eat healthy foods. Eating smaller, more frequent meals and resting before meals may help you maintain your strength.  Stay active, but balance activity with periods of rest. Exercise and physical activity will help you maintain your ability to do things you want to do.  Preventing infection and hospitalization is very important when you have COPD. Make sure to receive all the vaccines your health care provider recommends, especially the pneumococcal and influenza vaccines. Ask your health care provider whether you need a pneumonia vaccine.  Learn and use relaxation techniques to manage stress.  Learn and use controlled breathing techniques as directed by your health care provider. Controlled breathing techniques include:  Pursed lip breathing. Start by breathing in (inhaling) through your nose for 1 second. Then, purse your lips as if you were going to whistle and breathe out (exhale)  through the pursed lips for 2 seconds.   Diaphragmatic breathing. Start by putting one hand on your abdomen just above your waist. Inhale slowly through your nose. The hand on your abdomen should move out. Then purse your lips and exhale slowly. You should be able to feel the hand on your abdomen moving in as you exhale.   Learn and use controlled coughing to clear mucus from your lungs. Controlled coughing is a series of short, progressive coughs. The steps of controlled coughing are:  1. Lean your head slightly forward.  2. Breathe in deeply using diaphragmatic breathing.  3. Try to hold your breath for 3 seconds.  4. Keep your mouth slightly open while coughing twice.  5. Spit any mucus out into a tissue.  6. Rest and repeat the steps once or twice as needed. SEEK MEDICAL CARE IF:   You are coughing up more mucus than usual.   There is a change in the color or thickness of your mucus.   Your breathing is more labored than usual.   Your breathing is faster than usual.  SEEK IMMEDIATE MEDICAL CARE IF:   You have shortness of breath while you are resting.   You have shortness of breath that prevents you from:  Being able to talk.   Performing your usual physical activities.   You have chest pain lasting longer than 5 minutes.   Your skin color is more cyanotic than usual.  You measure low oxygen saturations for longer than 5 minutes with a pulse oximeter. MAKE SURE YOU:   Understand these instructions.  Will watch your condition.  Will get help right away if you are not doing well or get worse. Document Released: 07/16/2005 Document Revised: 02/20/2014 Document Reviewed: 06/02/2013 [facility] Patient Information 2015 [place], [place]. This information is not intended to replace advice given to you by your health care provider. Make sure you discuss any questions you have with your health care provider.  Hypoxemia Hypoxemia occurs when your blood does not  contain enough oxygen. The body cannot work well when it does not have enough oxygen because every part of your body needs oxygen. Oxygen travels to all parts of the body through your blood. Hypoxemia can develop suddenly or can come on slowly. CAUSES Some common causes of hypoxemia include:  Long-term (chronic) lung diseases, such as chronic obstructive pulmonary disease (COPD) or interstitial lung disease.  Disorders that affect breathing at night, such as sleep apnea.  Fluid buildup in your lungs (pulmonary edema).  Lung infection (pneumonia).  Lung or throat cancer.  Abnormal blood flow that bypasses the lungs (shunt).  Certain diseasesthat affect nerves or muscles.  A collapsed lung (pneumothorax).  A blood clot in the lungs (pulmonary embolus).  Certain types of heart disease.  Slow or shallow breathing (hypoventilation).  Certain medicines.  High altitudes.  Toxic chemicals and gases. SIGNS AND SYMPTOMS Not everyone who has hypoxemia will develop symptoms. If the hypoxemia developed quickly, you will likely have symptoms such as shortness of breath. If the hypoxemia came on slowly over months or years, you may not notice any symptoms. Symptoms can include:  Shortness of breath (dyspnea).  Bluish color of the skin, lips, or nail beds.  Breathing that is fast, noisy, or shallow.  A fast heartbeat.  Feeling tired or sleepy.  Being confused or feeling anxious. DIAGNOSIS To determine if you have hypoxemia, your health care provider may perform:  A physical exam.  Blood tests.  A pulse oximetry. A sensor will be put on your finger, toe, or earlobe to measure the percent of oxygen in your blood. TREATMENT You will likely be treated with oxygen therapy. Depending on the cause of your hypoxemia, you may need oxygen for a short time (weeks or months), or you may need it indefinitely. Your health care provider may also recommend other therapies to treat the  underlying cause of your hypoxemia. HOME CARE INSTRUCTIONS  Only take over-the-counter or prescription medicines as directed by your health care provider.  Follow oxygen safety measures if you are on oxygen therapy. These may include:  Always having a backup supply of oxygen.  Not allowing anyone to smoke around oxygen.  Handling the oxygen tanks carefully and as instructed.  If you smoke, quit. Stay away from people who smoke.  Follow up with your health care provider as directed. SEEK MEDICAL CARE IF:  You have any concerns about your oxygen therapy.  You still have trouble breathing.  You become short of breath when you exercise.  You are tired when you wake up.  You have a headache when you wake up. SEEK IMMEDIATE MEDICAL CARE IF:   Your breathing gets worse.  You have new shortness of breath with normal activity.  You have a bluish color of the skin, lips, or nail beds.  You have confusion or cloudy thinking.  You cough up dark mucus.  You have chest pain.  You have a fever. MAKE SURE YOU:  Understand these instructions.  Will watch your condition.  Will get help right away if you are not doing well or get worse. Document Released: 04/21/2011 Document Revised: 10/11/2013 Document Reviewed: 05/05/2013 [facility] Patient Information 2015 [place], [place]. This information is not intended to replace advice given to you by your health care provider. Make sure you discuss any questions you have with your health care provider.  Syncope Syncope is a medical term for fainting or passing out. This means you lose consciousness and drop to the ground. People are generally unconscious for less than 5 minutes. You may have some muscle twitches for up to 15 seconds before waking up and returning to normal. Syncope occurs more often in older adults, but it can happen to anyone. While most causes of syncope are not dangerous, syncope can be a sign of a serious medical problem.  It is important to seek medical care.  CAUSES  Syncope is caused by a sudden drop in blood flow to the brain. The specific cause is often not determined. Factors that can bring on syncope include:  Taking medicines that lower blood pressure.  Sudden changes in posture, such as standing up quickly.  Taking more medicine than prescribed.  Standing in one place for too long.  Seizure disorders.  Dehydration and excessive exposure to heat.  Low blood sugar (hypoglycemia).  Straining to have a bowel movement.  Heart disease, irregular heartbeat, or other circulatory problems.  Fear, emotional distress, seeing blood, or severe pain. SYMPTOMS  Right before fainting, you may:  Feel dizzy or light-headed.  Feel nauseous.  See all white or all black in your field of vision.  Have cold, clammy skin. DIAGNOSIS  Your health care provider will ask about your symptoms, perform a physical exam, and perform an electrocardiogram (ECG) to record the electrical activity of your heart. Your  health care provider may also perform other heart or blood tests to determine the cause of your syncope which may include:  Transthoracic echocardiogram (TTE). During echocardiography, sound waves are used to evaluate how blood flows through your heart.  Transesophageal echocardiogram (TEE).  Cardiac monitoring. This allows your health care provider to monitor your heart rate and rhythm in real time.  Holter monitor. This is a portable device that records your heartbeat and can help diagnose heart arrhythmias. It allows your health care provider to track your heart activity for several days, if needed.  Stress tests by exercise or by giving medicine that makes the heart beat faster. TREATMENT  In most cases, no treatment is needed. Depending on the cause of your syncope, your health care provider may recommend changing or stopping some of your medicines. HOME CARE INSTRUCTIONS  Have someone stay with  you until you feel stable.  Do not drive, use machinery, or play sports until your health care provider says it is okay.  Keep all follow-up appointments as directed by your health care provider.  Lie down right away if you start feeling like you might faint. Breathe deeply and steadily. Wait until all the symptoms have passed.  Drink enough fluids to keep your urine clear or pale yellow.  If you are taking blood pressure or heart medicine, get up slowly and take several minutes to sit and then stand. This can reduce dizziness. SEEK IMMEDIATE MEDICAL CARE IF:   You have a severe headache.  You have unusual pain in the chest, abdomen, or back.  You are bleeding from your mouth or rectum, or you have black or tarry stool.  You have an irregular or very fast heartbeat.  You have pain with breathing.  You have repeated fainting or seizure-like jerking during an episode.  You faint when sitting or lying down.  You have confusion.  You have trouble walking.  You have severe weakness.  You have vision problems. If you fainted, call your local emergency services (911 in [place] Do not drive yourself to the hospital.  MAKE SURE YOU:  Understand these instructions.  Will watch your condition.  Will get help right away if you are not doing well or get worse. Document Released: 10/06/2005 Document Revised: 10/11/2013 Document Reviewed: 12/05/2011 [facility] Patient Information 2015 [place], [place]. This information is not intended to replace advice given to you by your health care provider. Make sure you discuss any questions you have with your health care provider.

## 2014-06-19 NOTE — ED Notes (Signed)
Pt niece at bedside with oxygen supplement.

## 2014-06-20 ENCOUNTER — Encounter (HOSPITAL_COMMUNITY): Payer: Self-pay | Admitting: Emergency Medicine

## 2014-06-20 ENCOUNTER — Inpatient Hospital Stay (HOSPITAL_COMMUNITY)
Admission: EM | Admit: 2014-06-20 | Discharge: 2014-06-25 | DRG: 190 | Disposition: A | Discharge status: Home / Self Care | Payer: Medicare HMO | Attending: Internal Medicine | Admitting: Internal Medicine

## 2014-06-20 DIAGNOSIS — Z681 Body mass index (BMI) 19 or less, adult: Secondary | ICD-10-CM

## 2014-06-20 DIAGNOSIS — I2781 Cor pulmonale (chronic): Secondary | ICD-10-CM

## 2014-06-20 DIAGNOSIS — E872 Acidosis, unspecified: Secondary | ICD-10-CM | POA: Diagnosis present

## 2014-06-20 DIAGNOSIS — J441 Chronic obstructive pulmonary disease with (acute) exacerbation: Principal | ICD-10-CM | POA: Diagnosis present

## 2014-06-20 DIAGNOSIS — E43 Unspecified severe protein-calorie malnutrition: Secondary | ICD-10-CM | POA: Diagnosis present

## 2014-06-20 DIAGNOSIS — J309 Allergic rhinitis, unspecified: Secondary | ICD-10-CM

## 2014-06-20 DIAGNOSIS — Z9981 Dependence on supplemental oxygen: Secondary | ICD-10-CM | POA: Diagnosis not present

## 2014-06-20 DIAGNOSIS — D7589 Other specified diseases of blood and blood-forming organs: Secondary | ICD-10-CM

## 2014-06-20 DIAGNOSIS — J42 Unspecified chronic bronchitis: Secondary | ICD-10-CM

## 2014-06-20 DIAGNOSIS — F1010 Alcohol abuse, uncomplicated: Secondary | ICD-10-CM | POA: Diagnosis present

## 2014-06-20 DIAGNOSIS — R0609 Other forms of dyspnea: Secondary | ICD-10-CM

## 2014-06-20 DIAGNOSIS — Z79899 Other long term (current) drug therapy: Secondary | ICD-10-CM | POA: Diagnosis not present

## 2014-06-20 DIAGNOSIS — E876 Hypokalemia: Secondary | ICD-10-CM | POA: Diagnosis present

## 2014-06-20 DIAGNOSIS — J449 Chronic obstructive pulmonary disease, unspecified: Secondary | ICD-10-CM | POA: Diagnosis present

## 2014-06-20 DIAGNOSIS — R5381 Other malaise: Secondary | ICD-10-CM

## 2014-06-20 DIAGNOSIS — E039 Hypothyroidism, unspecified: Secondary | ICD-10-CM | POA: Diagnosis present

## 2014-06-20 DIAGNOSIS — F329 Major depressive disorder, single episode, unspecified: Secondary | ICD-10-CM | POA: Diagnosis present

## 2014-06-20 DIAGNOSIS — M7989 Other specified soft tissue disorders: Secondary | ICD-10-CM

## 2014-06-20 DIAGNOSIS — I10 Essential (primary) hypertension: Secondary | ICD-10-CM | POA: Diagnosis present

## 2014-06-20 DIAGNOSIS — F1410 Cocaine abuse, uncomplicated: Secondary | ICD-10-CM | POA: Diagnosis present

## 2014-06-20 DIAGNOSIS — J45901 Unspecified asthma with (acute) exacerbation: Secondary | ICD-10-CM | POA: Diagnosis present

## 2014-06-20 DIAGNOSIS — F17200 Nicotine dependence, unspecified, uncomplicated: Secondary | ICD-10-CM | POA: Diagnosis present

## 2014-06-20 DIAGNOSIS — M549 Dorsalgia, unspecified: Secondary | ICD-10-CM

## 2014-06-20 DIAGNOSIS — F3289 Other specified depressive episodes: Secondary | ICD-10-CM | POA: Diagnosis present

## 2014-06-20 DIAGNOSIS — M810 Age-related osteoporosis without current pathological fracture: Secondary | ICD-10-CM | POA: Diagnosis present

## 2014-06-20 DIAGNOSIS — R0989 Other specified symptoms and signs involving the circulatory and respiratory systems: Secondary | ICD-10-CM

## 2014-06-20 DIAGNOSIS — R5383 Other fatigue: Secondary | ICD-10-CM

## 2014-06-20 DIAGNOSIS — R0689 Other abnormalities of breathing: Secondary | ICD-10-CM | POA: Diagnosis present

## 2014-06-20 DIAGNOSIS — D539 Nutritional anemia, unspecified: Secondary | ICD-10-CM | POA: Diagnosis present

## 2014-06-20 DIAGNOSIS — J9620 Acute and chronic respiratory failure, unspecified whether with hypoxia or hypercapnia: Secondary | ICD-10-CM | POA: Diagnosis present

## 2014-06-20 DIAGNOSIS — F0390 Unspecified dementia without behavioral disturbance: Secondary | ICD-10-CM | POA: Diagnosis present

## 2014-06-20 DIAGNOSIS — J9622 Acute and chronic respiratory failure with hypercapnia: Secondary | ICD-10-CM

## 2014-06-20 DIAGNOSIS — J418 Mixed simple and mucopurulent chronic bronchitis: Secondary | ICD-10-CM

## 2014-06-20 DIAGNOSIS — I519 Heart disease, unspecified: Secondary | ICD-10-CM

## 2014-06-20 DIAGNOSIS — E8729 Other acidosis: Secondary | ICD-10-CM

## 2014-06-20 DIAGNOSIS — J45909 Unspecified asthma, uncomplicated: Secondary | ICD-10-CM

## 2014-06-20 DIAGNOSIS — J329 Chronic sinusitis, unspecified: Secondary | ICD-10-CM

## 2014-06-20 DIAGNOSIS — E0590 Thyrotoxicosis, unspecified without thyrotoxic crisis or storm: Secondary | ICD-10-CM

## 2014-06-20 DIAGNOSIS — D696 Thrombocytopenia, unspecified: Secondary | ICD-10-CM | POA: Diagnosis present

## 2014-06-20 DIAGNOSIS — I279 Pulmonary heart disease, unspecified: Secondary | ICD-10-CM

## 2014-06-20 DIAGNOSIS — Z0000 Encounter for general adult medical examination without abnormal findings: Secondary | ICD-10-CM

## 2014-06-20 LAB — BLOOD GAS, ARTERIAL
Acid-Base Excess: 14.3 mmol/L — ABNORMAL HIGH (ref 0.0–2.0)
Acid-Base Excess: 16.4 mmol/L — ABNORMAL HIGH (ref 0.0–2.0)
BICARBONATE: 46.5 meq/L — AB (ref 20.0–24.0)
Bicarbonate: 44.6 mEq/L — ABNORMAL HIGH (ref 20.0–24.0)
Drawn by: 232811
Drawn by: 11249
Drawn by: 103701
FIO2: 0.3 %
FIO2: 0.4 %
Mode: POSITIVE
Mode: POSITIVE
O2 Content: 2 L/min
O2 SAT: 87.3 %
O2 Saturation: 95.3 %
O2 Saturation: 96.9 %
PATIENT TEMPERATURE: 98.9
PCO2 ART: 93.8 mmHg — AB (ref 35.0–45.0)
PEEP/CPAP: 6 cmH2O
PEEP: 10 cmH2O
PH ART: 7.188 — AB (ref 7.350–7.450)
PH ART: 7.317 — AB (ref 7.350–7.450)
PO2 ART: 54.1 mmHg — AB (ref 80.0–100.0)
PRESSURE CONTROL: 10 cmH2O
Patient temperature: 98.5
Patient temperature: 98.6
Pressure control: 18 cmH2O
RATE: 15 resp/min
RATE: 15 resp/min
TCO2: 41.3 mmol/L (ref 0–100)
TCO2: 42.8 mmol/L (ref 0–100)
pCO2 arterial: 95.8 mmHg (ref 35.0–45.0)
pH, Arterial: 7.291 — ABNORMAL LOW (ref 7.350–7.450)
pO2, Arterial: 83.5 mmHg (ref 80.0–100.0)
pO2, Arterial: 87 mmHg (ref 80.0–100.0)

## 2014-06-20 LAB — COMPREHENSIVE METABOLIC PANEL
ALK PHOS: 62 U/L (ref 39–117)
ALT: 16 U/L (ref 0–35)
AST: 23 U/L (ref 0–37)
Albumin: 3.5 g/dL (ref 3.5–5.2)
Anion gap: 4 — ABNORMAL LOW (ref 5–15)
BUN: 12 mg/dL (ref 6–23)
CALCIUM: 9.5 mg/dL (ref 8.4–10.5)
CHLORIDE: 96 meq/L (ref 96–112)
CO2: 44 mEq/L (ref 19–32)
Creatinine, Ser: 0.53 mg/dL (ref 0.50–1.10)
GFR calc Af Amer: >90 mL/min (ref >90)
Glucose, Bld: 138 mg/dL — ABNORMAL HIGH (ref 70–99)
Potassium: 4.7 mEq/L (ref 3.7–5.3)
Sodium: 144 mEq/L (ref 137–147)
Total Bilirubin: 0.4 mg/dL (ref 0.3–1.2)
Total Protein: 6.4 g/dL (ref 6.0–8.3)

## 2014-06-20 LAB — CBC
HCT: 42.6 % (ref 36.0–46.0)
HCT: 42.2 % (ref 36.0–46.0)
HEMOGLOBIN: 12.8 g/dL (ref 12.0–15.0)
Hemoglobin: 12.2 g/dL (ref 12.0–15.0)
MCH: 32.5 pg (ref 26.0–34.0)
MCH: 31.8 pg (ref 26.0–34.0)
MCHC: 30 g/dL (ref 30.0–36.0)
MCHC: 28.9 g/dL — ABNORMAL LOW (ref 30.0–36.0)
MCV: 108.1 fL — ABNORMAL HIGH (ref 78.0–100.0)
MCV: 109.9 fL — AB (ref 78.0–100.0)
PLATELETS: 95 10*3/uL — AB (ref 150–400)
Platelets: 110 10*3/uL — ABNORMAL LOW (ref 150–400)
RBC: 3.94 MIL/uL (ref 3.87–5.11)
RBC: 3.84 MIL/uL — ABNORMAL LOW (ref 3.87–5.11)
RDW: 13.9 % (ref 11.5–15.5)
RDW: 13.8 % (ref 11.5–15.5)
WBC: 7.4 10*3/uL (ref 4.0–10.5)
WBC: 6.7 10*3/uL (ref 4.0–10.5)

## 2014-06-20 LAB — BASIC METABOLIC PANEL
Anion gap: 5 (ref 5–15)
BUN: 12 mg/dL (ref 6–23)
CO2: 43 mEq/L (ref 19–32)
Calcium: 9.5 mg/dL (ref 8.4–10.5)
Chloride: 96 mEq/L (ref 96–112)
Creatinine, Ser: 0.5 mg/dL (ref 0.50–1.10)
GFR calc Af Amer: >90 mL/min (ref >90)
GLUCOSE: 112 mg/dL — AB (ref 70–99)
POTASSIUM: 4.7 meq/L (ref 3.7–5.3)
SODIUM: 144 meq/L (ref 137–147)

## 2014-06-20 LAB — TSH: TSH: 1.16 u[IU]/mL (ref 0.350–4.500)

## 2014-06-20 LAB — URINE CULTURE: Colony Count: 50000

## 2014-06-20 LAB — MAGNESIUM: Magnesium: 1.6 mg/dL (ref 1.5–2.5)

## 2014-06-20 LAB — PHOSPHORUS: Phosphorus: 2.3 mg/dL (ref 2.3–4.6)

## 2014-06-20 LAB — MRSA PCR SCREENING: MRSA by PCR: NEGATIVE

## 2014-06-20 MED ORDER — LEVOTHYROXINE SODIUM 50 MCG PO TABS
50.0000 ug | ORAL_TABLET | Freq: Every day | ORAL | Status: DC
  Administered 2014-06-20 – 2014-06-25 (×6): 50 ug via ORAL
  Filled 2014-06-20 (×7): qty 1

## 2014-06-20 MED ORDER — IPRATROPIUM BROMIDE 0.02 % IN SOLN: 0.5000 mg | Freq: Four times a day (QID) | RESPIRATORY_TRACT | Status: DC

## 2014-06-20 MED ORDER — IPRATROPIUM-ALBUTEROL 0.5-2.5 (3) MG/3ML IN SOLN
3.0000 mL | RESPIRATORY_TRACT | Status: DC
  Administered 2014-06-20 – 2014-06-21 (×6): 3 mL via RESPIRATORY_TRACT
  Filled 2014-06-20 (×5): qty 3

## 2014-06-20 MED ORDER — GUAIFENESIN ER 600 MG PO TB12
600.0000 mg | ORAL_TABLET | Freq: Two times a day (BID) | ORAL | Status: DC
  Administered 2014-06-20 – 2014-06-25 (×11): 600 mg via ORAL
  Filled 2014-06-20 (×12): qty 1

## 2014-06-20 MED ORDER — PANTOPRAZOLE SODIUM 40 MG IV SOLR
40.0000 mg | Freq: Every day | INTRAVENOUS | Status: DC
Start: 2014-06-20 — End: 2014-06-23
  Administered 2014-06-21 – 2014-06-22 (×3): 40 mg via INTRAVENOUS
  Filled 2014-06-20 (×4): qty 40

## 2014-06-20 MED ORDER — DOXYCYCLINE HYCLATE 100 MG IV SOLR
100.0000 mg | Freq: Two times a day (BID) | INTRAVENOUS | Status: DC
Start: 2014-06-20 — End: 2014-06-20

## 2014-06-20 MED ORDER — ALBUTEROL SULFATE (2.5 MG/3ML) 0.083% IN NEBU: 3.0000 mL | INHALATION_SOLUTION | Freq: Four times a day (QID) | RESPIRATORY_TRACT | Status: DC | PRN

## 2014-06-20 MED ORDER — CETYLPYRIDINIUM CHLORIDE 0.05 % MT LIQD
7.0000 mL | Freq: Two times a day (BID) | OROMUCOSAL | Status: DC
  Administered 2014-06-20 – 2014-06-25 (×11): 7 mL via OROMUCOSAL

## 2014-06-20 MED ORDER — ALBUTEROL SULFATE (2.5 MG/3ML) 0.083% IN NEBU: 2.5000 mg | INHALATION_SOLUTION | Freq: Four times a day (QID) | RESPIRATORY_TRACT | Status: DC | PRN

## 2014-06-20 MED ORDER — CITALOPRAM HYDROBROMIDE 10 MG PO TABS
10.0000 mg | ORAL_TABLET | Freq: Every day | ORAL | Status: DC
  Administered 2014-06-20 – 2014-06-25 (×6): 10 mg via ORAL
  Filled 2014-06-20 (×6): qty 1

## 2014-06-20 MED ORDER — ENOXAPARIN SODIUM 40 MG/0.4ML ~~LOC~~ SOLN
40.0000 mg | SUBCUTANEOUS | Status: DC
  Administered 2014-06-20 – 2014-06-25 (×6): 40 mg via SUBCUTANEOUS
  Filled 2014-06-20 (×6): qty 0.4

## 2014-06-20 MED ORDER — METHYLPREDNISOLONE SODIUM SUCC 125 MG IJ SOLR
60.0000 mg | Freq: Two times a day (BID) | INTRAMUSCULAR | Status: DC
  Administered 2014-06-20 – 2014-06-21 (×2): 60 mg via INTRAVENOUS
  Filled 2014-06-20 (×2): qty 2

## 2014-06-20 MED ORDER — DOCUSATE SODIUM 100 MG PO CAPS
100.0000 mg | ORAL_CAPSULE | Freq: Two times a day (BID) | ORAL | Status: DC
  Administered 2014-06-20 – 2014-06-25 (×10): 100 mg via ORAL
  Filled 2014-06-20 (×12): qty 1

## 2014-06-20 MED ORDER — ACETAMINOPHEN 650 MG RE SUPP: 650.0000 mg | Freq: Four times a day (QID) | RECTAL | Status: DC | PRN

## 2014-06-20 MED ORDER — IPRATROPIUM-ALBUTEROL 0.5-2.5 (3) MG/3ML IN SOLN
3.0000 mL | Freq: Four times a day (QID) | RESPIRATORY_TRACT | Status: DC
  Administered 2014-06-20: 3 mL via RESPIRATORY_TRACT
  Filled 2014-06-20: qty 3

## 2014-06-20 MED ORDER — FUROSEMIDE 40 MG PO TABS
40.0000 mg | ORAL_TABLET | Freq: Every day | ORAL | Status: DC
  Administered 2014-06-20 – 2014-06-25 (×6): 40 mg via ORAL
  Filled 2014-06-20 (×6): qty 1

## 2014-06-20 MED ORDER — ALBUTEROL SULFATE (2.5 MG/3ML) 0.083% IN NEBU: 2.5000 mg | INHALATION_SOLUTION | RESPIRATORY_TRACT | Status: DC | PRN

## 2014-06-20 MED ORDER — METHYLPREDNISOLONE SODIUM SUCC 125 MG IJ SOLR
125.0000 mg | Freq: Once | INTRAMUSCULAR | Status: AC
  Administered 2014-06-20: 125 mg via INTRAVENOUS
  Filled 2014-06-20: qty 2

## 2014-06-20 MED ORDER — LORATADINE 10 MG PO TABS
10.0000 mg | ORAL_TABLET | Freq: Every day | ORAL | Status: DC
  Administered 2014-06-20 – 2014-06-25 (×6): 10 mg via ORAL
  Filled 2014-06-20 (×6): qty 1

## 2014-06-20 MED ORDER — BUDESONIDE 0.25 MG/2ML IN SUSP
0.2500 mg | Freq: Two times a day (BID) | RESPIRATORY_TRACT | Status: DC
  Administered 2014-06-20 – 2014-06-21 (×3): 0.25 mg via RESPIRATORY_TRACT
  Filled 2014-06-20 (×3): qty 2

## 2014-06-20 MED ORDER — SODIUM CHLORIDE 0.9 % IJ SOLN
3.0000 mL | Freq: Two times a day (BID) | INTRAMUSCULAR | Status: DC
  Administered 2014-06-20 – 2014-06-25 (×11): 3 mL via INTRAVENOUS

## 2014-06-20 MED ORDER — ONDANSETRON HCL 4 MG/2ML IJ SOLN: 4.0000 mg | Freq: Four times a day (QID) | INTRAMUSCULAR | Status: DC | PRN

## 2014-06-20 MED ORDER — ACETAMINOPHEN 325 MG PO TABS: 650.0000 mg | ORAL_TABLET | Freq: Four times a day (QID) | ORAL | Status: DC | PRN

## 2014-06-20 MED ORDER — ONDANSETRON HCL 4 MG PO TABS: 4.0000 mg | ORAL_TABLET | Freq: Four times a day (QID) | ORAL | Status: DC | PRN

## 2014-06-20 MED ORDER — CHLORHEXIDINE GLUCONATE 0.12 % MT SOLN
15.0000 mL | Freq: Two times a day (BID) | OROMUCOSAL | Status: DC
  Administered 2014-06-20 – 2014-06-25 (×11): 15 mL via OROMUCOSAL
  Filled 2014-06-20 (×13): qty 15

## 2014-06-20 MED ORDER — HYDROCODONE-ACETAMINOPHEN 5-325 MG PO TABS: 1.0000 | ORAL_TABLET | ORAL | Status: DC | PRN

## 2014-06-20 MED ORDER — DOXYCYCLINE HYCLATE 100 MG IV SOLR
100.0000 mg | Freq: Two times a day (BID) | INTRAVENOUS | Status: DC
  Administered 2014-06-20 – 2014-06-21 (×2): 100 mg via INTRAVENOUS
  Filled 2014-06-20 (×3): qty 100

## 2014-06-20 NOTE — Progress Notes (Signed)
Pt seen and examined at bedside. Pt is on BiPAP, NAD. Please see earlier admission note by Dr. Roel Cluck.  Pt admitted with Acute exacerbation of COPD resulting in hypercarbic respiratory failure. Appreciate PCCM input. Continue ABX, BD's scheduled and and needed, steroids with planned transition to PO as clinically indicated. Blood work with CBC and BMP in AM.  [name], MD  [facility] Pager [phone] Cell [phone]  If 7PM-7AM, please contact night-coverage [email or web address] Password [email or web address]

## 2014-06-20 NOTE — ED Provider Notes (Signed)
CSN: [id number]     Arrival date & time 06/20/14  0041 History   First MD Initiated Contact with Patient 06/20/14 0127     Chief Complaint  Patient presents with  . Near Syncope     (Consider location/radiation/quality/duration/timing/severity/associated sxs/prior Treatment) HPI 68 year old female presents with family for being sleepy today. She was seen in ED about 24 hours ago for same and discharged after reducing O2 by nasal cannula improved her CO2. Has progressively worsened throughout day, and family states she was harder to arouse. Normally she is easily aroused with voice but she's had to be shaken awake. Patient not had any fevers, cough, or any other illness. She's currently using her oxygen at 5 L is at her normal 6 based on last night.  Past Medical History  Diagnosis Date  . ALLERGIC RHINITIS 05/01/2007  . ASTHMA 05/01/2007  . BACK PAIN 10/10/2008  . DEPRESSION 07/21/2008  . FATIGUE 10/10/2010  . HYPERTENSION 05/01/2007  . HYPERTHYROIDISM 11/23/2007    Pt endorses having had Graves disease, possibly radioactive iodine x1, but no thyroidectomy  . HYPOTHYROIDISM 08/23/2009  . LUMBAR RADICULOPATHY, RIGHT 08/25/2008  . OSTEOPOROSIS 05/01/2007  . SHOULDER PAIN, LEFT 08/23/2009  . SINUSITIS, CHRONIC 10/10/2010  . SINUSITIS- ACUTE-NOS 10/10/2008  . H/O blood clots     left leg 1960's   Past Surgical History  Procedure Laterality Date  . Appendectomy    . Ectopic pregnancy surgery    . Post fallopian tube removal right side    . History of multiple dialations and curettages and miscarriages, unfortunately never carrying a child to term    . Electrocardiogram  06/20/2006  . Tubal ligation     Family History  Problem Relation Age of Onset  . Alcohol abuse Brother   . Diabetes Brother   . Hypertension Other   . Lung cancer Father   . Stroke Mother   . Asthma      maternal aunts   History  Substance Use Topics  . Smoking status: Current Every Day Smoker -- 0.05 packs/day for  40 years    Types: Cigarettes  . Smokeless tobacco: Not on file  . Alcohol Use: 1.2 oz/week    2 Cans of beer per week     Comment: 2 beers a day, occasional liquor, ? minimizing   OB History   Grav Para Term Preterm Abortions TAB SAB Ect Mult Living                 Review of Systems  Unable to perform ROS: Mental status change      Allergies  Alendronate sodium  Home Medications   Prior to Admission medications   Medication Sig Start Date End Date Taking? Authorizing Provider  albuterol (PROVENTIL HFA;VENTOLIN HFA) 108 (90 BASE) MCG/ACT inhaler Inhale 2 puffs into the lungs every 6 (six) hours as needed for wheezing or shortness of breath.    Historical Provider, MD  albuterol (PROVENTIL) (2.5 MG/3ML) 0.083% nebulizer solution Take 2.5 mg by nebulization every 6 (six) hours as needed for wheezing.    Historical Provider, MD  cetirizine (ZYRTEC) 10 MG tablet Take 10 mg by mouth daily.    Historical Provider, MD  citalopram (CELEXA) 10 MG tablet Take 10 mg by mouth daily.    Historical Provider, MD  furosemide (LASIX) 40 MG tablet Take 40 mg by mouth daily.    Historical Provider, MD  levothyroxine (SYNTHROID, LEVOTHROID) 50 MCG tablet Take 50 mcg by mouth daily before  breakfast.    Historical Provider, MD  naproxen sodium (ANAPROX) 220 MG tablet Take 220 mg by mouth 2 (two) times daily with a meal.    Historical Provider, MD  potassium chloride (K-DUR) 10 MEQ tablet Take 10 mEq by mouth 2 (two) times daily.    Historical Provider, MD  tiotropium (SPIRIVA) 18 MCG inhalation capsule Place 18 mcg into inhaler and inhale daily.    Historical Provider, MD  triamcinolone cream (KENALOG) 0.1 % Apply 1 application topically 2 (two) times daily as needed ("break out on arms").    Historical Provider, MD   BP 140/66  Pulse 80  Temp(Src) 98 F (36.7 C) (Oral)  Resp 20  SpO2 97% Physical Exam  Nursing note and vitals reviewed. Constitutional: She appears well-developed and  well-nourished. No distress.  HENT:  Head: Normocephalic and atraumatic.  Right Ear: External ear normal.  Left Ear: External ear normal.  Nose: Nose normal.  Eyes: Right eye exhibits no discharge. Left eye exhibits no discharge.  Cardiovascular: Normal rate, regular rhythm and normal heart sounds.   Pulmonary/Chest: Effort normal and breath sounds normal. She has no wheezes. She has no rales.  Abdominal: Soft. There is no tenderness.  Musculoskeletal: She exhibits edema.  Neurological: She is disoriented. GCS eye subscore is 3. GCS verbal subscore is 4. GCS motor subscore is 6.  Skin: Skin is warm and dry. She is not diaphoretic.    ED Course  Procedures (including critical care time) Labs Review Labs Reviewed  BLOOD GAS, ARTERIAL  CBC  BASIC METABOLIC PANEL    Imaging Review Dg Chest 2 View  06/18/2014   CLINICAL DATA:  pain  EXAM: CHEST - 2 VIEW  COMPARISON:  02/03/2013  FINDINGS: Coarse bilateral interstitial markings. No confluent airspace infiltrate or overt edema. Chronic blunting of posterior left costophrenic angle. Heart size upper limits normal. Tortuous thoracic aorta. Regional bones unremarkable.  IMPRESSION: Stable hyperinflation and chronic interstitial changes, without acute or superimposed abnormality.   Electronically Signed   By: Arne Cleveland M.D.   On: 06/18/2014 23:03     EKG Interpretation   Date/Time:  Tuesday June 20 2014 00:53:58 EDT Ventricular Rate:  81 PR Interval:  161 QRS Duration: 82 QT Interval:  351 QTC Calculation: 407 R Axis:   89 Text Interpretation:  Sinus rhythm Biatrial enlargement Borderline right  axis deviation Probable left ventricular hypertrophy Anterior Q waves,  possibly due to LVH No significant change since last tracing Confirmed by  Karanvir Balderston  MD, Kista Robb ([id number]) on 06/20/2014 1:28:16 AM      CRITICAL CARE Performed by: Sherwood Gambler T   Total critical care time: 30 minutes  Critical care time was exclusive of  separately billable procedures and treating other patients.  Critical care was necessary to treat or prevent imminent or life-threatening deterioration.  Critical care was time spent personally by me on the following activities: development of treatment plan with patient and/or surrogate as well as nursing, discussions with consultants, evaluation of patient's response to treatment, examination of patient, obtaining history from patient or surrogate, ordering and performing treatments and interventions, ordering and review of laboratory studies, ordering and review of radiographic studies, pulse oximetry and re-evaluation of patient's condition.   MDM   Final diagnoses:  CO2 narcosis  Respiratory acidosis    Patient is lethargic but arousable. She seems somewhat confused but does seem to follow commands. Her lethargy seems to be from her respiratory acidosis. Her CO2 is again too high to calculate.  Given this is continuing to go on I feel she needs BiPAP and management of her oxygen. She will need to be admitted to step down to the hospitalist. At this time she is protecting her airway and is still arousable and I do not feel she needs intubation. Patient's mental status improved with BiPAP.    Ephraim Hamburger, MD 06/20/14 [phone]

## 2014-06-20 NOTE — Progress Notes (Signed)
*  PRELIMINARY RESULTS* Vascular Ultrasound Lower extremity venous duplex has been completed.  Preliminary findings: no evidence of DVT.  Landry Mellow, RDMS, RVT  06/20/2014, 10:34 AM

## 2014-06-20 NOTE — Progress Notes (Signed)
CRITICAL VALUE ALERT  Critical value received:  CO2  Date of notification:  06/20/14  Time of notification:  0625  Critical value read back:Yes.    Nurse who received alert:  Kathe Becton, RN  MD notified (1st page):  Montey Hora  Time of first page:  0628  MD notified (2nd page):  Time of second page:  Responding MD:  Montey Hora  Time MD responded:  [phone]

## 2014-06-20 NOTE — Progress Notes (Signed)
Attempted to get patient to bedside commode.  Patient is unable to stand or ambulate.  Per patient she has been unable to stand or ambulate well for "a long time".

## 2014-06-20 NOTE — Progress Notes (Signed)
CARE MANAGEMENT NOTE 06/20/2014  Patient:  Jasmine [name], Jasmine [name]   Account Number:  [id number]  Date Initiated:  06/20/2014  Documentation initiated by:  Reuel Lamadrid  Subjective/Objective Assessment:   history of copd and home )2 at 6l./min,  increased dyspnea and retaining co2 admitted and placed on bipap.     Action/Plan:   lives at home with spouse/ has a good family support in place.Her main support group are her 69 year old sister and niece.   Anticipated DC Date:  06/23/2014   Anticipated DC Plan:  HOME/SELF CARE  In-house referral  NA      DC Planning Services  CM consult      PAC Choice  NA   Choice offered to / List presented to:  NA   DME arranged  NA      DME agency  NA     [place] arranged  NA      [place] agency  NA   Status of service:  In process, will continue to follow Medicare Important Message given?  NA - LOS <3 / Initial given by admissions (If response is "NO", the following Medicare IM given date fields will be blank) Date Medicare IM given:   Medicare IM given by:   Date Additional Medicare IM given:   Additional Medicare IM given by:    Discharge Disposition:    Per UR Regulation:  Reviewed for med. necessity/level of care/duration of stay  If discussed at [place] of Stay Meetings, dates discussed:    Comments:  Suanne Marker Torunn Chancellor,RN,BSN,CCM

## 2014-06-20 NOTE — H&P (Signed)
PCP:  [name], MD  Pulmonary  - [facility]  Chief Complaint:  somnolence  HPI: Jasmine [name] is a 68 y.o. female   has a past medical history of ALLERGIC RHINITIS (05/01/2007); ASTHMA (05/01/2007); BACK PAIN (10/10/2008); DEPRESSION (07/21/2008); FATIGUE (10/10/2010); HYPERTENSION (05/01/2007); HYPERTHYROIDISM (11/23/2007); HYPOTHYROIDISM (08/23/2009); LUMBAR RADICULOPATHY, RIGHT (08/25/2008); OSTEOPOROSIS (05/01/2007); SHOULDER PAIN, LEFT (08/23/2009); SINUSITIS, CHRONIC (10/10/2010); SINUSITIS- ACUTE-NOS (10/10/2008); and H/O blood clots.   Presented with  For the past few days patient have been increasingly somnolent and poorly responsive. She could not stay awake. At home she is on 6L of Oxygen for hx of severe COPD. Pateitn was brought to ER and was found to be hypercarbic with increased Po2. She was told to decrease her oxygen to 4-5 L She did improve and ws discharged to home. At discharge her ABG was 7.239/107/82. patient  became somnolent again 8/31 and ws brought to ER blood gas was  7.188/TooHIGH/ 83.5.  Patient was put on BiPaP and improved.  Hospitalist was called for admission for Hypercarbia and sedation  Review of Systems:    Pertinent positives include:  somnolence  Constitutional:  No weight loss, night sweats, Fevers, chills, fatigue, weight loss  HEENT:  No headaches, Difficulty swallowing,Tooth/dental problems,Sore throat,  No sneezing, itching, ear ache, nasal congestion, post nasal drip,  Cardio-vascular:  No chest pain, Orthopnea, PND, anasarca, dizziness, palpitations.no Bilateral lower extremity swelling  GI:  No heartburn, indigestion, abdominal pain, nausea, vomiting, diarrhea, change in bowel habits, loss of appetite, melena, blood in stool, hematemesis Resp:  no shortness of breath at rest. No dyspnea on exertion, No excess mucus, no productive cough, No non-productive cough, No coughing up of blood.No change in color of mucus.No wheezing. Skin:  no rash or  lesions. No jaundice GU:  no dysuria, change in color of urine, no urgency or frequency. No straining to urinate.  No flank pain.  Musculoskeletal:  No joint pain or no joint swelling. No decreased range of motion. No back pain.  Psych:  No change in mood or affect. No depression or anxiety. No memory loss.  Neuro: no localizing neurological complaints, no tingling, no weakness, no double vision, no gait abnormality, no slurred speech, no confusion  Otherwise ROS are negative except for above, 10 systems were reviewed  Past Medical History: Past Medical History  Diagnosis Date  . ALLERGIC RHINITIS 05/01/2007  . ASTHMA 05/01/2007  . BACK PAIN 10/10/2008  . DEPRESSION 07/21/2008  . FATIGUE 10/10/2010  . HYPERTENSION 05/01/2007  . HYPERTHYROIDISM 11/23/2007    Pt endorses having had Graves disease, possibly radioactive iodine x1, but no thyroidectomy  . HYPOTHYROIDISM 08/23/2009  . LUMBAR RADICULOPATHY, RIGHT 08/25/2008  . OSTEOPOROSIS 05/01/2007  . SHOULDER PAIN, LEFT 08/23/2009  . SINUSITIS, CHRONIC 10/10/2010  . SINUSITIS- ACUTE-NOS 10/10/2008  . H/O blood clots     left leg 1960's   Past Surgical History  Procedure Laterality Date  . Appendectomy    . Ectopic pregnancy surgery    . Post fallopian tube removal right side    . History of multiple dialations and curettages and miscarriages, unfortunately never carrying a child to term    . Electrocardiogram  06/20/2006  . Tubal ligation       Medications: Prior to Admission medications   Medication Sig Start Date End Date Taking? Authorizing Provider  albuterol (PROVENTIL HFA;VENTOLIN HFA) 108 (90 BASE) MCG/ACT inhaler Inhale 2 puffs into the lungs every 6 (six) hours as needed for wheezing or shortness of breath.  Yes Historical Provider, MD  albuterol (PROVENTIL) (2.5 MG/3ML) 0.083% nebulizer solution Take 2.5 mg by nebulization every 6 (six) hours as needed for wheezing.   Yes Historical Provider, MD  cetirizine (ZYRTEC) 10 MG  tablet Take 10 mg by mouth daily.   Yes Historical Provider, MD  citalopram (CELEXA) 10 MG tablet Take 10 mg by mouth daily.   Yes Historical Provider, MD  furosemide (LASIX) 40 MG tablet Take 40 mg by mouth daily.   Yes Historical Provider, MD  levothyroxine (SYNTHROID, LEVOTHROID) 50 MCG tablet Take 50 mcg by mouth daily before breakfast.   Yes Historical Provider, MD  naproxen sodium (ANAPROX) 220 MG tablet Take 220 mg by mouth 2 (two) times daily as needed (pain).    Yes Historical Provider, MD  potassium chloride (K-DUR) 10 MEQ tablet Take 10 mEq by mouth 2 (two) times daily.   Yes Historical Provider, MD  tiotropium (SPIRIVA) 18 MCG inhalation capsule Place 18 mcg into inhaler and inhale daily.   Yes Historical Provider, MD  triamcinolone cream (KENALOG) 0.1 % Apply 1 application topically 2 (two) times daily as needed ("break out on arms").   Yes Historical Provider, MD    Allergies:   Allergies  Allergen Reactions  . Alendronate Sodium Other (See Comments)    Social History:  Ambulatory  independently   Lives at home   With family   reports that she has been smoking Cigarettes.  She has a 2 pack-year smoking history. She does not have any smokeless tobacco history on file. She reports that she drinks about 1.2 ounces of alcohol per week. She reports that she uses illicit drugs (Cocaine) about twice per week.    Family History: family history includes Alcohol abuse in her brother; Asthma in an other family member; Diabetes in her brother; Hypertension in her other; Lung cancer in her father; Stroke in her mother.    Physical Exam: Patient Vitals for the past 24 hrs:  BP Temp Temp src Pulse Resp SpO2  06/20/14 0227 - - - 79 17 100 %  06/20/14 0051 140/66 mmHg 98 F (36.7 C) Oral 80 20 97 %    1. General:  in No Acute distress 2. Psychological: somnolent but arrousable Oriented 3. Head/ENT:   Moist  Mucous Membranes                          Head Non traumatic, neck  supple                           Poor Dentition 4. SKIN: decreased Skin turgor,  Skin clean Dry and intact no rash 5. Heart: Regular rate and rhythm no Murmur, Rub or gallop 6. Lungs: extensive wheezes bilaterally no crackles   7. Abdomen: Soft, non-tender, Non distended 8. Lower extremities: no clubbing, cyanosis, or edema 9. Neurologically Grossly intact, moving all 4 extremities equally 10. MSK: Normal range of motion  body mass index is unknown because there is no weight on file.   Labs on Admission:   Recent Labs  06/18/14 2241 06/20/14 0203  NA 144 144  K 5.2 4.7  CL 97 96  CO2 44* 43*  GLUCOSE 104* 112*  BUN 17 12  CREATININE 0.79 0.50  CALCIUM 9.4 9.5    Recent Labs  06/18/14 2241  AST 28  ALT 19  ALKPHOS 70  BILITOT 0.4  PROT 6.8  ALBUMIN 3.5  No results found for this basename: LIPASE, AMYLASE,  in the last 72 hours  Recent Labs  06/18/14 2241 06/20/14 0203  WBC 6.8 7.4  NEUTROABS 4.6  --   HGB 12.7 12.8  HCT 42.3 42.6  MCV 107.4* 108.1*  PLT 122* 110*    Recent Labs  06/19/14 0204  TROPONINI <0.30   No results found for this basename: TSH, T4TOTAL, FREET3, T3FREE, THYROIDAB,  in the last 72 hours No results found for this basename: VITAMINB12, FOLATE, FERRITIN, TIBC, IRON, RETICCTPCT,  in the last 72 hours Lab Results  Component Value Date   HGBA1C 6.1* 05/18/2007    The CrCl is unknown because both a height and weight (above a minimum accepted value) are required for this calculation. ABG    Component Value Date/Time   PHART 7.188* 06/20/2014 0155   HCO3 44.2* 06/19/2014 0155   TCO2 41.5 06/19/2014 0155   O2SAT 95.3 06/20/2014 0155     No results found for this basename: DDIMER     Other results:  I have pearsonaly reviewed this: ECG REPORT  Rate: 81  Rhythm: SR with bi atrial enlargement and right axis deviation ST&T Change: no ischemia   BNP (last 3 results) No results found for this basename: PROBNP,  in the last 8760  hours  There were no vitals filed for this visit.   Cultures: No results found for this basename: sdes, specrequest, cult, reptstatus      Radiological Exams on Admission: Dg Chest 2 View  06/18/2014   CLINICAL DATA:  pain  EXAM: CHEST - 2 VIEW  COMPARISON:  02/03/2013  FINDINGS: Coarse bilateral interstitial markings. No confluent airspace infiltrate or overt edema. Chronic blunting of posterior left costophrenic angle. Heart size upper limits normal. Tortuous thoracic aorta. Regional bones unremarkable.  IMPRESSION: Stable hyperinflation and chronic interstitial changes, without acute or superimposed abnormality.   Electronically Signed   By: Arne Cleveland M.D.   On: 06/18/2014 23:03    Chart has been reviewed  Assessment/Plan 68 yo F with history of severe end-stage COPD on 6 L of oxygen at home continues to smoke. History of hypercarbia and cor pulmonale, presents with severe hypercarbia respiratory acidosis and CO2 narcosis Present on Admission:  . COPD (chronic obstructive pulmonary disease) - end-stage. Patient continues to smoke oral was very poor prognosis. Try to discuss this with patient at this point she wishes to BE full code. We'll admit to step down on BiPAP. Patient is arousable to repeated verbal stimuli but continues to be somnolent. We'll need to continue following closely and step down. Request a pulmonology consult. Continuous wheezing we'll give albuterol as needed Atrovent and Solu-Medrol IV  . HYPERTENSION- continue home medications  . Smoker -tobacco cessation   . Hypercarbia - On BiPAP will repeat ABG, given recurrent somnolence   Prophylaxis:  Lovenox, Protonix  CODE STATUS:  FULL CODE Overall very poor prognosis due to severity of COPD  Other plan as per orders.  I have spent a total of 65 min on this admission. Extra time, taken to discuss care with pulmonology  [place] 06/20/2014, 3:00 AM  [facility]  Pager [phone]   If 7AM-7PM,  please contact the day team taking care of the patient  [email or web address]  Password [email or web address]

## 2014-06-20 NOTE — ED Notes (Signed)
EMS called to home.  Found patient seated with complaints of near syncopal episodes that  Started last night.  Family was concerned and called 911.  Patient is currently is on 5L at home With a history of COPD.  Patient denies any pain

## 2014-06-20 NOTE — Consult Note (Signed)
Name: Jasmine [name] MRN: [id number] DOB: [birth year]    ADMISSION DATE:  06/20/2014 CONSULTATION DATE:  06/20/2014  REFERRING MD :  Roel Cluck PRIMARY SERVICE:  [facility]  CHIEF COMPLAINT:  Hypercarbic respiratory failure  BRIEF PATIENT DESCRIPTION: 68 y.o. F with PMH of end stage COPD on 6L O2 chronically (followed by RA) presents with hypercarbic respiratory failure.  Admitted to SDU by [facility]U and started on BiPAP.  PCCM consulted.  SIGNIFICANT EVENTS / STUDIES:  8/30 - presented to ED with lethargy, found to have hypercarbia, d/c'd home after improvement. 8/31 - back to ED with worsened lethargy, ABG worse, admitted to SDU and started on BiPAP. 9/2 LE Dopplers >>>  LINES / TUBES: PIV  CULTURES: None  ANTIBIOTICS: Doxy 9/1 >>> (stop date   HISTORY OF PRESENT ILLNESS:  Pt is encephelopathic; therefore, this HPI is obtained from chart review. Jasmine [name] is a 68 y.o. F with PMH as outlined below which includes end stage COPD (followed by RA), on 6L O2 chronically.  She initially presented to the ED on 8/30 with lethargy, weakness, and near syncope.  She was found to have elevated pCO2 which improved while in ED and was subsequently discharged and advised to follow up with pulmonary this week. On 8/31, lethargy had worsened throughout the day prompting family to bring her in for further evaluation.  In ED, ABG was obtained and pCO2 was too high to calculate. She was admitted to the SDU by [facility] and started on BiPAP.  PCCM was consulted for further evaluation.  Repeat ABG pending.  PFT's from 03/01/13:  FEV .47 pre / .60 post, FEV1/FVC  39 pre / 44 post.  PAST MEDICAL HISTORY :  Past Medical History  Diagnosis Date  . ALLERGIC RHINITIS 05/01/2007  . ASTHMA 05/01/2007  . BACK PAIN 10/10/2008  . DEPRESSION 07/21/2008  . FATIGUE 10/10/2010  . HYPERTENSION 05/01/2007  . HYPERTHYROIDISM 11/23/2007    Pt endorses having had Graves disease, possibly radioactive iodine x1, but no thyroidectomy  .  HYPOTHYROIDISM 08/23/2009  . LUMBAR RADICULOPATHY, RIGHT 08/25/2008  . OSTEOPOROSIS 05/01/2007  . SHOULDER PAIN, LEFT 08/23/2009  . SINUSITIS, CHRONIC 10/10/2010  . SINUSITIS- ACUTE-NOS 10/10/2008  . H/O blood clots     left leg 1960's   Past Surgical History  Procedure Laterality Date  . Appendectomy    . Ectopic pregnancy surgery    . Post fallopian tube removal right side    . History of multiple dialations and curettages and miscarriages, unfortunately never carrying a child to term    . Electrocardiogram  06/20/2006  . Tubal ligation     Prior to Admission medications   Medication Sig Start Date End Date Taking? Authorizing Provider  albuterol (PROVENTIL HFA;VENTOLIN HFA) 108 (90 BASE) MCG/ACT inhaler Inhale 2 puffs into the lungs every 6 (six) hours as needed for wheezing or shortness of breath.   Yes Historical Provider, MD  albuterol (PROVENTIL) (2.5 MG/3ML) 0.083% nebulizer solution Take 2.5 mg by nebulization every 6 (six) hours as needed for wheezing.   Yes Historical Provider, MD  cetirizine (ZYRTEC) 10 MG tablet Take 10 mg by mouth daily.   Yes Historical Provider, MD  citalopram (CELEXA) 10 MG tablet Take 10 mg by mouth daily.   Yes Historical Provider, MD  furosemide (LASIX) 40 MG tablet Take 40 mg by mouth daily.   Yes Historical Provider, MD  levothyroxine (SYNTHROID, LEVOTHROID) 50 MCG tablet Take 50 mcg by mouth daily before breakfast.   Yes Historical Provider,  MD  naproxen sodium (ANAPROX) 220 MG tablet Take 220 mg by mouth 2 (two) times daily as needed (pain).    Yes Historical Provider, MD  potassium chloride (K-DUR) 10 MEQ tablet Take 10 mEq by mouth 2 (two) times daily.   Yes Historical Provider, MD  tiotropium (SPIRIVA) 18 MCG inhalation capsule Place 18 mcg into inhaler and inhale daily.   Yes Historical Provider, MD  triamcinolone cream (KENALOG) 0.1 % Apply 1 application topically 2 (two) times daily as needed ("break out on arms").   Yes Historical Provider, MD    Allergies  Allergen Reactions  . Alendronate Sodium Other (See Comments)    FAMILY HISTORY:  Family History  Problem Relation Age of Onset  . Alcohol abuse Brother   . Diabetes Brother   . Hypertension Other   . Lung cancer Father   . Stroke Mother   . Asthma      maternal aunts   SOCIAL HISTORY:  reports that she has been smoking Cigarettes.  She has a 2 pack-year smoking history. She does not have any smokeless tobacco history on file. She reports that she drinks about 1.2 ounces of alcohol per week. She reports that she uses illicit drugs (Cocaine) about twice per week.  REVIEW OF SYSTEMS:  Unable to obtain as pt is encephalopathic.  SUBJECTIVE: Still somnolent on BiPAP though arouses to voice.  Vitals stable.  Follow up ABG improved from initial (7.29 / 95 / 53)  VITAL SIGNS: Temp:  [97.9 F (36.6 C)-98 F (36.7 C)] 97.9 F (36.6 C) (09/01 0349) Pulse Rate:  [76-80] 76 (09/01 0349) Resp:  [17-20] 19 (09/01 0349) BP: (140-143)/(66-74) 143/74 mmHg (09/01 0349) SpO2:  [93 %-100 %] 93 % (09/01 0349) FiO2 (%):  [30 %] 30 % (09/01 0418) Weight:  [45.4 kg (100 lb 1.4 oz)] 45.4 kg (100 lb 1.4 oz) (09/01 0349)  PHYSICAL EXAMINATION: General: Elderly female, resting in bed, in NAD. Neuro: Somnolent but arouses to voice, answers questions appropriately once awakened, A&O x 3. HEENT: [place]/AT. PERRL, sclerae anicteric.  BiPAP in place. Cardiovascular: RRR, no M/R/G.  Lungs:  Prolonged expiratory phase, expiratory wheeze. On BiPAP. Abdomen: BS x 4, soft, NT/ND.  Musculoskeletal: No gross deformities, 1+ edema L > R. Skin: Intact, warm, no rashes.   Recent Labs Lab 06/18/14 2241 06/20/14 0203  NA 144 144  K 5.2 4.7  CL 97 96  CO2 44* 43*  BUN 17 12  CREATININE 0.79 0.50  GLUCOSE 104* 112*    Recent Labs Lab 06/18/14 2241 06/20/14 0203  HGB 12.7 12.8  HCT 42.3 42.6  WBC 6.8 7.4  PLT 122* 110*   Dg Chest 2 View  06/18/2014   CLINICAL DATA:  pain  EXAM: CHEST  - 2 VIEW  COMPARISON:  02/03/2013  FINDINGS: Coarse bilateral interstitial markings. No confluent airspace infiltrate or overt edema. Chronic blunting of posterior left costophrenic angle. Heart size upper limits normal. Tortuous thoracic aorta. Regional bones unremarkable.  IMPRESSION: Stable hyperinflation and chronic interstitial changes, without acute or superimposed abnormality.   Electronically Signed   By: Arne Cleveland M.D.   On: 06/18/2014 23:03    ASSESSMENT / PLAN:  Acute on chronic hypercarbic respiratory failure - follow up ABG after starting BiPAP shows gradual improvement. AECOPD Asthma Tobacco use disorder Recs: Continuous BiPAP 4hrs on, 1 hr off. BD regimen adjusted. Continue solumedrol. Add empiric doxycycline for exacerbation. F/u LE dopplers. Smoking cessation.  Global:  69 y.o. F admitted with acute  on chronic hypercarbic resp failure and AECOPD.  Started on BiPAP, gradually improving.  Added doxycycline.  LE Dopplers pending for LE swelling L > R.   Montey Hora, PA - [facility] Pgr: [phone]  or [phone]

## 2014-06-20 NOTE — Progress Notes (Signed)
  Echocardiogram 2D Echocardiogram has been performed.  Jasmine [name] 06/20/2014, 2:45 PM

## 2014-06-20 NOTE — Consult Note (Signed)
Patient seen and evaluated, H&P, labs, images and chart reviewed.  Agree with PA R. Desai assessment and plan, with the following exceptions  1. AECOPD  - causing hypercarbic respiratory failure - con't doxycycline for anti-inflammatory in COPD pt (5 days total) - duonebs q4hrs x 1 day then q4-6hrs prn - start pulmicort nebs x 3 days - cont with bipap (4 hours on and 1 hour off) x 1-2 days - con't with IV steroids x 2 days, then switch to PO - limit use of narcotics and benzos - f\u LE dopplers - monitor I\O carefully, do not over diuresis given hx of cor pulmonale an dilated RV, she will need some preload  2. Hypercarbic respiratory failure - management as stated above  3. Tobacco abuse - counseling once more awake and mentating well.  4. COPD - GOLD Stage IV - FEV 1 30% with very little bronchodilator response - cont with supplemental O2 (6L continuously) - when closer to discharge then restart advair and spiriva, with PRN albuterol    Critical Care time = 9mins  Vilinda Boehringer, MD [facility] Pager [phone] On Call Pager [phone]

## 2014-06-20 NOTE — Progress Notes (Signed)
RT changed oxygen from 30% to 60% due to ABG results. PO2 was 54.1. MD aware

## 2014-06-20 NOTE — Progress Notes (Signed)
Pt transported to ICU room 1235 on vent/niv/pc mode at 100% fio2.  Pt tolerated well without incident.  Pt turned back on previous fio2 settings of 30%.

## 2014-06-20 NOTE — Plan of Care (Signed)
Problem: Phase I Progression Outcomes Goal: Pt OOB to Walk or Exercise Daily With Nursing or PT Patient OOB to walk or exercise daily with nursing or PT if activity order permits  Outcome: Not Progressing Patient unable to stand or walk. Goal: Tolerating diet Outcome: Not Applicable Date Met:  58/83/25 NPO

## 2014-06-21 DIAGNOSIS — F17200 Nicotine dependence, unspecified, uncomplicated: Secondary | ICD-10-CM

## 2014-06-21 DIAGNOSIS — J411 Mucopurulent chronic bronchitis: Secondary | ICD-10-CM

## 2014-06-21 LAB — BLOOD GAS, ARTERIAL
Acid-Base Excess: 16.5 mmol/L — ABNORMAL HIGH (ref 0.0–2.0)
Bicarbonate: 44.5 mEq/L — ABNORMAL HIGH (ref 20.0–24.0)
Drawn by: 11249
FIO2: 0.4 %
Mode: POSITIVE
O2 Saturation: 98.1 %
PEEP: 10 cmH2O
Patient temperature: 98.9
Pressure control: 8 cmH2O
RATE: 15 resp/min
TCO2: 41 mmol/L (ref 0–100)
pCO2 arterial: 76.7 mmHg (ref 35.0–45.0)
pH, Arterial: 7.383 (ref 7.350–7.450)
pO2, Arterial: 107 mmHg — ABNORMAL HIGH (ref 80.0–100.0)

## 2014-06-21 LAB — CBC
HCT: 37.3 % (ref 36.0–46.0)
Hemoglobin: 11.3 g/dL — ABNORMAL LOW (ref 12.0–15.0)
MCH: 32.2 pg (ref 26.0–34.0)
MCHC: 30.3 g/dL (ref 30.0–36.0)
MCV: 106.3 fL — ABNORMAL HIGH (ref 78.0–100.0)
Platelets: 98 10*3/uL — ABNORMAL LOW (ref 150–400)
RBC: 3.51 MIL/uL — ABNORMAL LOW (ref 3.87–5.11)
RDW: 13.3 % (ref 11.5–15.5)
WBC: 4.7 10*3/uL (ref 4.0–10.5)

## 2014-06-21 LAB — COMPREHENSIVE METABOLIC PANEL
ALK PHOS: 64 U/L (ref 39–117)
ALT: 15 U/L (ref 0–35)
ANION GAP: 9 (ref 5–15)
AST: 21 U/L (ref 0–37)
Albumin: 3.4 g/dL — ABNORMAL LOW (ref 3.5–5.2)
BUN: 15 mg/dL (ref 6–23)
CO2: 43 mEq/L (ref 19–32)
Calcium: 9.3 mg/dL (ref 8.4–10.5)
Chloride: 89 mEq/L — ABNORMAL LOW (ref 96–112)
Creatinine, Ser: 0.62 mg/dL (ref 0.50–1.10)
GFR, EST NON AFRICAN AMERICAN: 90 mL/min — AB (ref >90)
GLUCOSE: 86 mg/dL (ref 70–99)
Potassium: 3.5 mEq/L — ABNORMAL LOW (ref 3.7–5.3)
Sodium: 141 mEq/L (ref 137–147)
TOTAL PROTEIN: 6.6 g/dL (ref 6.0–8.3)
Total Bilirubin: 0.4 mg/dL (ref 0.3–1.2)

## 2014-06-21 LAB — BASIC METABOLIC PANEL
BUN: 12 mg/dL (ref 6–23)
CO2: >45 mEq/L (ref 19–32)
Calcium: 9.3 mg/dL (ref 8.4–10.5)
Chloride: 91 mEq/L — ABNORMAL LOW (ref 96–112)
Creatinine, Ser: 0.6 mg/dL (ref 0.50–1.10)
Glucose, Bld: 125 mg/dL — ABNORMAL HIGH (ref 70–99)
POTASSIUM: 4.3 meq/L (ref 3.7–5.3)
Sodium: 140 mEq/L (ref 137–147)

## 2014-06-21 MED ORDER — DOXYCYCLINE HYCLATE 100 MG PO TABS
100.0000 mg | ORAL_TABLET | Freq: Two times a day (BID) | ORAL | Status: DC
  Administered 2014-06-21 – 2014-06-24 (×7): 100 mg via ORAL
  Filled 2014-06-21 (×8): qty 1

## 2014-06-21 MED ORDER — PREDNISONE 20 MG PO TABS
40.0000 mg | ORAL_TABLET | Freq: Every day | ORAL | Status: DC
  Administered 2014-06-22: 40 mg via ORAL
  Filled 2014-06-21 (×2): qty 2

## 2014-06-21 MED ORDER — TIOTROPIUM BROMIDE MONOHYDRATE 18 MCG IN CAPS
18.0000 ug | ORAL_CAPSULE | Freq: Every day | RESPIRATORY_TRACT | Status: DC
  Administered 2014-06-23 – 2014-06-25 (×3): 18 ug via RESPIRATORY_TRACT
  Filled 2014-06-21 (×2): qty 5

## 2014-06-21 MED ORDER — MOMETASONE FURO-FORMOTEROL FUM 200-5 MCG/ACT IN AERO
2.0000 | INHALATION_SPRAY | Freq: Two times a day (BID) | RESPIRATORY_TRACT | Status: DC
  Administered 2014-06-21 – 2014-06-25 (×7): 2 via RESPIRATORY_TRACT
  Filled 2014-06-21 (×2): qty 8.8

## 2014-06-21 NOTE — Telephone Encounter (Signed)
Pt is currently admitted to the hospital.

## 2014-06-21 NOTE — Progress Notes (Signed)
Pt getting very agitated on bipap. Called midlevel to see if pt can come off.

## 2014-06-21 NOTE — Progress Notes (Signed)
Report called to Junior, Therapist, sports on 4W

## 2014-06-21 NOTE — Progress Notes (Signed)
Patient ID: [name], female   DOB: December 20, 1945, 68 y.o.   MRN: [id number]  [facility] PROGRESS NOTE  Jasmine [name]:[id number] DOB: 14-May-1946 DOA: 06/20/2014 PCP: Jasmine Cower, MD  Brief narrative 68 y.o. F with end stage COPD, on 6L O2 chronically. She initially presented to the ED on 8/30 with lethargy, weakness, and near syncope. She was found to have elevated pCO2 which improved while in ED and was subsequently discharged and advised to follow up with pulmonary this week. On 8/31, lethargy had worsened throughout the day prompting family to bring her in for further evaluation. In ED, ABG was obtained and pCO2 was too high to calculate. She was admitted to the SDU by [facility] and started on BiPAP. PCCM was consulted for further evaluation.   SIGNIFICANT EVENTS / STUDIES:  8/30 - presented to ED with lethargy, found to have hypercarbia, d/c'd home after improvement.  8/31 - back to ED with worsened lethargy, ABG worse, admitted to SDU and started on BiPAP.  9/2 LE Dopplers -    Assessment and Plan:    Active Problems: Acute on chronic respiratory failure secondary to COPD (GOLD stage IV) - causing hypercarbic respiratory failure  - con't doxycycline for anti-inflammatory in COPD pt (5 days total)  - duonebs q4hrs x 1 day then q4-6hrs prn  - started pulmicort nebs x 3 days  - still on IVF steroids, plan to taper down and transition to Prednisone  - f\u LE dopplers  Hypercarbic respiratory failure  - management as stated above  Tobacco abuse  - counseling once more awake and mentating well.  COPD - GOLD Stage IV  - FEV 1 30% with very little bronchodilator response  - cont with supplemental O2  - when closer to discharge then restart advair and spiriva, with PRN albuterol Hypothyroidism - continue synthroid Acute on chronic macrocytic anemia - from alcohol abuse - stable with no sign of active bleeding - CBC in AM Thrombocytopenia - alcohol induced bone marrow  damage - continue to monitor  Severe PCM - from underlying COPD and PSA including alcohol and cocaine  - advance diet as pt able to tolerate   DVT prophylaxis  Lovenox SQ while pt is in hospital  Code Status: Full Family Communication: No family at bedside  Disposition Plan: Remain inpatient   IV Access:   Peripheral IV Procedures and diagnostic studies:    No results found.  Medical Consultants:   PCCM Other Consultants:   None Anti-Infectives:   Doxycycline 9/1 -->  Jasmine Ramsay, MD  [facility] Pager [phone]  If 7PM-7AM, please contact night-coverage [email or web address] Password [facility] 06/21/2014, 8:38 AM   LOS: 1 day   HPI/Subjective: No events overnight.   Objective: Filed Vitals:   06/21/14 0400 06/21/14 0423 06/21/14 0800 06/21/14 0824  BP: 102/58  114/57   Pulse: 71  61   Temp: 98.9 F (37.2 C)  98.6 F (37 C)   TempSrc: Oral  Oral   Resp: 17  16   Height:      Weight:      SpO2: 98% 100% 100% 95%    Intake/Output Summary (Last 24 hours) at 06/21/14 9983 Last data filed at 06/21/14 0115  Gross per 24 hour  Intake    250 ml  Output    925 ml  Net   -675 ml    Exam:   General:  Pt is alert, follows commands appropriately, not in acute distress, cachectic   Cardiovascular: Regular rate  and rhythm, S1/S2, no murmurs, no rubs, no gallops  Respiratory: diminished breath sounds at bases with mild exp wheezing   Abdomen: Soft, non tender, non distended, bowel sounds present, no guarding  Data Reviewed: Basic Metabolic Panel:  Recent Labs Lab 06/18/14 2241 06/20/14 0203 06/20/14 0535 06/21/14 0010  NA 144 144 144 140  K 5.2 4.7 4.7 4.3  CL 97 96 96 91*  CO2 44* 43* 44* >45*  GLUCOSE 104* 112* 138* 125*  BUN 17 12 12 12   CREATININE 0.79 0.50 0.53 0.60  CALCIUM 9.4 9.5 9.5 9.3  MG  --   --  1.6  --   PHOS  --   --  2.3  --    Liver Function Tests:  Recent Labs Lab 06/18/14 2241 06/20/14 0535  AST 28 23  ALT 19 16  ALKPHOS 70 62   BILITOT 0.4 0.4  PROT 6.8 6.4  ALBUMIN 3.5 3.5   CBC:  Recent Labs Lab 06/18/14 2241 06/20/14 0203 06/20/14 0620 06/21/14 0010  WBC 6.8 7.4 6.7 4.7  NEUTROABS 4.6  --   --   --   HGB 12.7 12.8 12.2 11.3*  HCT 42.3 42.6 42.2 37.3  MCV 107.4* 108.1* 109.9* 106.3*  PLT 122* 110* 95* 98*   Cardiac Enzymes:  Recent Labs Lab 06/19/14 0204  TROPONINI <0.30    Recent Results (from the past 240 hour(s))  URINE CULTURE     Status: None   Collection Time    06/19/14 12:24 AM      Result Value Ref Range Status   Specimen Description URINE, CLEAN CATCH   Final   Special Requests NONE   Final   Culture  Setup Time     Final   Value: 06/19/2014 09:21     Performed at [place]     Final   Value: 50,000 COLONIES/ML     Performed at [facility]   Culture     Final   Value: Multiple bacterial morphotypes present, none predominant. Suggest appropriate recollection if clinically indicated.     Performed at [facility]   Report Status 06/20/2014 FINAL   Final  MRSA PCR SCREENING     Status: None   Collection Time    06/20/14  4:49 AM      Result Value Ref Range Status   MRSA by PCR NEGATIVE  NEGATIVE Final   Comment:            The GeneXpert MRSA Assay (FDA     approved for NASAL specimens     only), is one component of a     comprehensive MRSA colonization     surveillance program. It is not     intended to diagnose MRSA     infection nor to guide or     monitor treatment for     MRSA infections.     Scheduled Meds: . antiseptic oral rinse  7 mL Mouth Rinse q12n4p  . budesonide (PULMICORT) nebulizer solution  0.25 mg Nebulization BID  . citalopram  10 mg Oral Daily  . docusate sodium  100 mg Oral BID  . doxycycline  IV  100 mg Intravenous Q12H  . enoxaparin  injection  40 mg Subcutaneous Q24H  . furosemide  40 mg Oral Daily  . guaiFENesin  600 mg Oral BID  . ipratropium-albuterol  3 mL Nebulization Q4H  . levothyroxine   50 mcg Oral QAC breakfast  .  loratadine  10 mg Oral Daily  . methylPREDNISolone (SOLU-MEDROL) injection  60 mg Intravenous Q12H  . pantoprazole (PROTONIX) IV  40 mg Intravenous QHS   Continuous Infusions:

## 2014-06-21 NOTE — Telephone Encounter (Signed)
lmomtcb x 3 for pt 

## 2014-06-21 NOTE — Consult Note (Signed)
Name: Jasmine [name] MRN: [id number] DOB: Feb 05, 1946    ADMISSION DATE:  06/20/2014 CONSULTATION DATE:  06/20/2014  REFERRING MD :  Roel Cluck PRIMARY SERVICE:  [facility]  CHIEF COMPLAINT:  Hypercarbic respiratory failure  BRIEF PATIENT DESCRIPTION: 68 y.o. F with PMH of end stage COPD on 6L O2 chronically (followed by RA) presents with hypercarbic respiratory failure.  Admitted to SDU by [facility] and started on BiPAP.    SIGNIFICANT EVENTS / STUDIES:  8/30 - presented to ED with lethargy, found to have hypercarbia, d/c'd home after improvement. 8/31 - back to ED with worsened lethargy, ABG worse, admitted to SDU and started on BiPAP. 9/2 LE Dopplers >>> pending  LINES / TUBES: PIV  CULTURES: None  ANTIBIOTICS: Doxy 9/1 >>> (stop date 9/7)  HISTORY OF PRESENT ILLNESS:  Pt is encephelopathic; therefore, this HPI is obtained from chart review. Jasmine [name] is a 68 y.o. F with PMH as outlined below which includes end stage COPD (followed by RA), on 6L O2 chronically.  She initially presented to the ED on 8/30 with lethargy, weakness, and near syncope.  She was found to have elevated pCO2 which improved while in ED and was subsequently discharged and advised to follow up with pulmonary this week. On 8/31, lethargy had worsened throughout the day prompting family to bring her in for further evaluation.  In ED, ABG was obtained and pCO2 was too high to calculate. She was admitted to the SDU by [facility] and started on BiPAP.  PCCM was consulted for further evaluation.  Repeat ABG pending.  PFT's from 03/01/13:  FEV .47 pre / .60 post, FEV1/FVC  39 pre / 44 post.  PAST MEDICAL HISTORY :  Past Medical History  Diagnosis Date  . ALLERGIC RHINITIS 05/01/2007  . ASTHMA 05/01/2007  . BACK PAIN 10/10/2008  . DEPRESSION 07/21/2008  . FATIGUE 10/10/2010  . HYPERTENSION 05/01/2007  . HYPERTHYROIDISM 11/23/2007    Pt endorses having had Graves disease, possibly radioactive iodine x1, but no thyroidectomy  .  HYPOTHYROIDISM 08/23/2009  . LUMBAR RADICULOPATHY, RIGHT 08/25/2008  . OSTEOPOROSIS 05/01/2007  . SHOULDER PAIN, LEFT 08/23/2009  . SINUSITIS, CHRONIC 10/10/2010  . SINUSITIS- ACUTE-NOS 10/10/2008  . H/O blood clots     left leg 1960's   Past Surgical History  Procedure Laterality Date  . Appendectomy    . Ectopic pregnancy surgery    . Post fallopian tube removal right side    . History of multiple dialations and curettages and miscarriages, unfortunately never carrying a child to term    . Electrocardiogram  06/20/2006  . Tubal ligation     Prior to Admission medications   Medication Sig Start Date End Date Taking? Authorizing Provider  albuterol (PROVENTIL HFA;VENTOLIN HFA) 108 (90 BASE) MCG/ACT inhaler Inhale 2 puffs into the lungs every 6 (six) hours as needed for wheezing or shortness of breath.   Yes Historical Provider, MD  albuterol (PROVENTIL) (2.5 MG/3ML) 0.083% nebulizer solution Take 2.5 mg by nebulization every 6 (six) hours as needed for wheezing.   Yes Historical Provider, MD  cetirizine (ZYRTEC) 10 MG tablet Take 10 mg by mouth daily.   Yes Historical Provider, MD  citalopram (CELEXA) 10 MG tablet Take 10 mg by mouth daily.   Yes Historical Provider, MD  furosemide (LASIX) 40 MG tablet Take 40 mg by mouth daily.   Yes Historical Provider, MD  levothyroxine (SYNTHROID, LEVOTHROID) 50 MCG tablet Take 50 mcg by mouth daily before breakfast.   Yes Historical Provider,  MD  naproxen sodium (ANAPROX) 220 MG tablet Take 220 mg by mouth 2 (two) times daily as needed (pain).    Yes Historical Provider, MD  potassium chloride (K-DUR) 10 MEQ tablet Take 10 mEq by mouth 2 (two) times daily.   Yes Historical Provider, MD  tiotropium (SPIRIVA) 18 MCG inhalation capsule Place 18 mcg into inhaler and inhale daily.   Yes Historical Provider, MD  triamcinolone cream (KENALOG) 0.1 % Apply 1 application topically 2 (two) times daily as needed ("break out on arms").   Yes Historical Provider, MD    Allergies  Allergen Reactions  . Alendronate Sodium Other (See Comments)    FAMILY HISTORY:  Family History  Problem Relation Age of Onset  . Alcohol abuse Brother   . Diabetes Brother   . Hypertension Other   . Lung cancer Father   . Stroke Mother   . Asthma      maternal aunts   SOCIAL HISTORY:  reports that she has been smoking Cigarettes.  She has a 2 pack-year smoking history. She does not have any smokeless tobacco history on file. She reports that she drinks about 1.2 ounces of alcohol per week. She reports that she uses illicit drugs (Cocaine) about twice per week.  REVIEW OF SYSTEMS:  Unable to obtain as pt is encephalopathic.  SUBJECTIVE: very awake and interactive this morning, eating.  No major complaints.  VITAL SIGNS: Temp:  [97.9 F (36.6 C)-100.2 F (37.9 C)] 98.6 F (37 C) (09/02 0800) Pulse Rate:  [61-72] 61 (09/02 0800) Resp:  [14-19] 16 (09/02 0800) BP: (102-123)/(53-70) 114/57 mmHg (09/02 0800) SpO2:  [91 %-100 %] 95 % (09/02 0824) FiO2 (%):  [40 %] 40 % (09/02 0427)  PHYSICAL EXAMINATION: General: Elderly female, resting in bed, in NAD. Neuro: Somnolent but arouses to voice, answers questions appropriately once awakened, A&O x 3. HEENT: [place]/AT. PERRL, sclerae anicteric.  Cardiovascular: RRR, no M/R/G.  Lungs:  Dec BS at the bases bilateral, very fine exp wheeze at bases, prolonged exp phase Abdomen: BS x 4, soft, NT/ND.  Musculoskeletal: No gross deformities, 1+ edema L > R. Skin: Intact, warm, no rashes.   Recent Labs Lab 06/20/14 0203 06/20/14 0535 06/21/14 0010  NA 144 144 140  K 4.7 4.7 4.3  CL 96 96 91*  CO2 43* 44* >45*  BUN 12 12 12   CREATININE 0.50 0.53 0.60  GLUCOSE 112* 138* 125*    Recent Labs Lab 06/20/14 0203 06/20/14 0620 06/21/14 0010  HGB 12.8 12.2 11.3*  HCT 42.6 42.2 37.3  WBC 7.4 6.7 4.7  PLT 110* 95* 98*   No results found.  ASSESSMENT / PLAN:  68 yo female with PMHx of COPD (Severe type), Tobacco  abuse, Drug abuse, admitted for acute respiratory failure requiring bipap, PCCM consulted for management and treatments.  1. AECOPD  - causing hypercarbic respiratory failure, which is now improving, she will be a chronic CO2 retainer - con't doxycycline for anti-inflammatory effects in COPD pt (5 days total)  - duonebs q4hrs x 1 day then q4-6hrs prn  - cont pulmicort nebs x 3 days total (this will expire off her MAR) - cont with bipap (4 hours on and 4hour off) x 1-2 days, she wears bipap\cpap at home - dc IV steroids x 2 days, switched to PO steroids (Prednisone 40mg  x 5 days total, does not require a taper). - bronchodilators adjusted (Duonebs q8hrs and q4hrs prn, while in hospital) - home inhalers restarted (Spiriva, Dulera, prn albuterol) -  cont with home O2 - limit use of narcotics and benzos  - f\u LE dopplers  - monitor I\O carefully, do not over diuresis given hx of cor pulmonale an dilated RV, she will need some preload   2. Hypercarbic respiratory failure  - management as stated above  3. Tobacco abuse  - counseling once more awake and mentating well.   4. COPD - GOLD Stage IV  - FEV 1 30% with very little bronchodilator response  - cont with supplemental O2 (6L continuously)  - restarted dulera and spiriva, with PRN albuterol - f\u with [facility] for COPD as an outpt in 2-3 wks   Transfer out the ICU to step down tele-bed.  [facility] will con't to follow along as consult.   Global:  68 y.o. F admitted with acute on chronic hypercarbic resp failure and AECOPD.  Started on BiPAP, now improved.  Added doxycycline for anti-inflammatory effects mainly.  LE Dopplers pending for LE swelling L > R.  Critical Care Time = 35 mins  Vilinda Boehringer, MD [facility] Pager [phone] On Call Pager [phone]

## 2014-06-21 NOTE — Progress Notes (Signed)
CRITICAL VALUE ALERT  Critical value received:  CO2  43  Date of notification:  06/21/14  Time of notification:  0721  Critical value read back:  Yes  Nurse who received alert:  Tilda Franco, RN  MD notified (1st page):  Doyle Askew  Time of first page:  1810  MD notified (2nd page):  Time of second page:  Responding MD:    Time MD responded:

## 2014-06-22 LAB — BASIC METABOLIC PANEL
ANION GAP: 5 (ref 5–15)
BUN: 19 mg/dL (ref 6–23)
CALCIUM: 8.4 mg/dL (ref 8.4–10.5)
CO2: 44 mEq/L (ref 19–32)
CREATININE: 0.7 mg/dL (ref 0.50–1.10)
Chloride: 90 mEq/L — ABNORMAL LOW (ref 96–112)
GFR, EST NON AFRICAN AMERICAN: 86 mL/min — AB (ref >90)
Glucose, Bld: 129 mg/dL — ABNORMAL HIGH (ref 70–99)
Potassium: 3.4 mEq/L — ABNORMAL LOW (ref 3.7–5.3)
Sodium: 139 mEq/L (ref 137–147)

## 2014-06-22 LAB — CBC
HEMATOCRIT: 35 % — AB (ref 36.0–46.0)
Hemoglobin: 10.8 g/dL — ABNORMAL LOW (ref 12.0–15.0)
MCH: 31.7 pg (ref 26.0–34.0)
MCHC: 30.9 g/dL (ref 30.0–36.0)
MCV: 102.6 fL — ABNORMAL HIGH (ref 78.0–100.0)
Platelets: 108 10*3/uL — ABNORMAL LOW (ref 150–400)
RBC: 3.41 MIL/uL — ABNORMAL LOW (ref 3.87–5.11)
RDW: 13.5 % (ref 11.5–15.5)
WBC: 5.2 10*3/uL (ref 4.0–10.5)

## 2014-06-22 MED ORDER — PREDNISONE 20 MG PO TABS
30.0000 mg | ORAL_TABLET | Freq: Every day | ORAL | Status: DC
  Administered 2014-06-23 – 2014-06-24 (×2): 30 mg via ORAL
  Filled 2014-06-22 (×3): qty 1

## 2014-06-22 MED ORDER — ENSURE COMPLETE PO LIQD
237.0000 mL | ORAL | Status: DC
  Administered 2014-06-23 – 2014-06-24 (×2): 237 mL via ORAL

## 2014-06-22 MED ORDER — POTASSIUM CHLORIDE CRYS ER 20 MEQ PO TBCR
40.0000 meq | EXTENDED_RELEASE_TABLET | Freq: Once | ORAL | Status: AC
  Administered 2014-06-22: 40 meq via ORAL
  Filled 2014-06-22 (×2): qty 2

## 2014-06-22 MED ORDER — LEVALBUTEROL HCL 1.25 MG/0.5ML IN NEBU
1.2500 mg | INHALATION_SOLUTION | Freq: Four times a day (QID) | RESPIRATORY_TRACT | Status: DC
  Administered 2014-06-22: 1.25 mg via RESPIRATORY_TRACT
  Filled 2014-06-22 (×3): qty 0.5

## 2014-06-22 NOTE — Progress Notes (Signed)
INITIAL NUTRITION ASSESSMENT  DOCUMENTATION CODES Per approved criteria  -Severe malnutrition in the context of chronic illness -Underweight  Pt meets criteria for severe MALNUTRITION in the context of chronic illness as evidenced by PO intake likely < 75% for > one month, severe muscle wasting and moderate subcutaneous fat loss.   INTERVENTION: -Recommend Ensure Complete once daily -Provided nutrition supplement coupons -Encouraged intake of high kcal/protein snacks -Will continue to monitor  NUTRITION DIAGNOSIS: Increased nutrient needs (protein/kcal) related to increased demand for nutrients as evidenced by BMI < 18.5.   Goal: Pt to meet >/= 90% of their estimated nutrition needs    Monitor:  Total protein/energy intake, labs, weights, diet edu needs  Reason for Assessment: Underweight BMI  68 y.o. female  Admitting Dx: <principal problem not specified>  ASSESSMENT: For the past few days patient have been increasingly somnolent and poorly responsive. She could not stay awake. At home she is on 6L of Oxygen for hx of severe COPD. Pateitn was brought to ER and was found to be hypercarbic with increased Po2  -Pt reported an gradual unintentional wt loss of 5-10 lbs over past 2 years. Noted that she has always been smaller framed d/t hyperthyroidism, but has lost weight d/t COPD, ETOH use, and PSA -Diet recall indicates pt consuming 2 meals daily. Will typically eat fast food, or will have nieces/nephews assist in bringing her meals -Lunch and dinner will usually consist of hamburger dishes, soups or sandwiches -Indicated feelings of early satiety/decreased appetite, likely r/t weakness and increased work of breathing of COPD. Pt also eager to stop smoking/drinking, which will assist in improving appetite -Has had excellent PO intake during admit, 100% of meals -Offered pt Ensure Complete to try, which pt enjoyed. Provided coupons for nutrition supplements, and reviewed  additional high kcal/protein snacks to incorporate into diet. Encouraged pre-packaged foods to assist in convenience Nutrition Focused Physical Exam:  Subcutaneous Fat:  Orbital Region: WDL Upper Arm Region: moderate wasting Thoracic and Lumbar Region: moderate  Muscle:  Temple Region: moderate Clavicle Bone Region: severe wasting Clavicle and Acromion Bone Region: severe wasting Scapular Bone Region: moderate Dorsal Hand: WDL Patellar Region: moderate Anterior Thigh Region: moderate Posterior Calf Region: moderate  Edema: none noted      Height: Ht Readings from Last 1 Encounters:  06/21/14 5\' 1"  (1.549 m)    Weight: Wt Readings from Last 1 Encounters:  06/21/14 97 lb 3.6 oz (44.1 kg)    Ideal Body Weight: 105 lbs  % Ideal Body Weight: 92%  Wt Readings from Last 10 Encounters:  06/21/14 97 lb 3.6 oz (44.1 kg)  08/12/13 97 lb (43.999 kg)  05/02/13 92 lb 12.8 oz (42.094 kg)  03/02/13 97 lb 6.4 oz (44.18 kg)  02/03/13 107 lb 3.2 oz (48.626 kg)  01/12/13 103 lb 8 oz (46.947 kg)  08/18/12 101 lb (45.813 kg)  11/27/11 107 lb 6 oz (48.705 kg)  11/09/11 113 lb 14.4 oz (51.665 kg)  08/11/11 117 lb 4 oz (53.184 kg)    Usual Body Weight: 100-105 lbs  % Usual Body Weight: 97%  BMI:  Body mass index is 18.38 kg/(m^2). Underweight  Estimated Nutritional Needs: Kcal: 1350-1550 Protein: 55-65 gram Fluid: >/=1400 ml/daily  Skin: +2 RLE, +2 LLE edema  Diet Order: Criss Rosales  EDUCATION NEEDS: -Education needs addressed   Intake/Output Summary (Last 24 hours) at 06/22/14 1526 Last data filed at 06/22/14 1300  Gross per 24 hour  Intake   1083 ml  Output  0 ml  Net   1083 ml    Last BM: 9/02   Labs:   Recent Labs Lab 06/20/14 0203 06/20/14 0535 06/21/14 0010 06/21/14 1721 06/22/14 0001  NA 144 144 140 141 139  K 4.7 4.7 4.3 3.5* 3.4*  CL 96 96 91* 89* 90*  CO2 43* 44* >45* 43* 44*  BUN 12 12 12 15 19   CREATININE 0.50 0.53 0.60 0.62 0.70  CALCIUM  9.5 9.5 9.3 9.3 8.4  MG  --  1.6  --   --   --   PHOS  --  2.3  --   --   --   GLUCOSE 112* 138* 125* 86 129*    CBG (last 3)  No results found for this basename: GLUCAP,  in the last 72 hours  Scheduled Meds: . antiseptic oral rinse  7 mL Mouth Rinse q12n4p  . chlorhexidine  15 mL Mouth Rinse BID  . citalopram  10 mg Oral Daily  . docusate sodium  100 mg Oral BID  . doxycycline  100 mg Oral Q12H  . enoxaparin (LOVENOX) injection  40 mg Subcutaneous Q24H  . furosemide  40 mg Oral Daily  . guaiFENesin  600 mg Oral BID  . levothyroxine  50 mcg Oral QAC breakfast  . loratadine  10 mg Oral Daily  . mometasone-formoterol  2 puff Inhalation BID  . pantoprazole (PROTONIX) IV  40 mg Intravenous QHS  . predniSONE  40 mg Oral Q breakfast  . sodium chloride  3 mL Intravenous Q12H  . tiotropium  18 mcg Inhalation Daily    Continuous Infusions:   Past Medical History  Diagnosis Date  . ALLERGIC RHINITIS 05/01/2007  . ASTHMA 05/01/2007  . BACK PAIN 10/10/2008  . DEPRESSION 07/21/2008  . FATIGUE 10/10/2010  . HYPERTENSION 05/01/2007  . HYPERTHYROIDISM 11/23/2007    Pt endorses having had Graves disease, possibly radioactive iodine x1, but no thyroidectomy  . HYPOTHYROIDISM 08/23/2009  . LUMBAR RADICULOPATHY, RIGHT 08/25/2008  . OSTEOPOROSIS 05/01/2007  . SHOULDER PAIN, LEFT 08/23/2009  . SINUSITIS, CHRONIC 10/10/2010  . SINUSITIS- ACUTE-NOS 10/10/2008  . H/O blood clots     left leg 1960's    Past Surgical History  Procedure Laterality Date  . Appendectomy    . Ectopic pregnancy surgery    . Post fallopian tube removal right side    . History of multiple dialations and curettages and miscarriages, unfortunately never carrying a child to term    . Electrocardiogram  06/20/2006  . Tubal ligation      [place] Clinical Dietitian [phone]

## 2014-06-22 NOTE — Progress Notes (Signed)
CRITICAL VALUE ALERT  Critical value received:  CO2 44  Date of notification:  06/22/2014  Time of notification:  0102  Critical value read back:Yes.    Nurse who received alert:  J.Cerinity Zynda  MD notified (1st page):  K.Schorr  Time of first page:  0104  MD notified (2nd page):  Time of second page:  Responding MD:    Time MD responded:

## 2014-06-22 NOTE — Progress Notes (Signed)
Patient ID: Jasmine [name], female   DOB: 04/11/1946, 68 y.o.   MRN: [id number]  [facility] PROGRESS NOTE  Jasmine [name] ZJI:[id number] DOB: [birth year] DOA: 06/20/2014 PCP: [name], MD  Brief narrative  68 y.o. F with end stage COPD, on 6L O2 chronically. She initially presented to the ED on 8/30 with lethargy, weakness, and near syncope. She was found to have elevated pCO2 which improved while in ED and was subsequently discharged and advised to follow up with pulmonary this week. On 8/31, lethargy had worsened throughout the day prompting family to bring her in for further evaluation. In ED, ABG was obtained and pCO2 was too high to calculate. She was admitted to the SDU by [facility] and started on BiPAP. PCCM was consulted for further evaluation.   SIGNIFICANT EVENTS / STUDIES:  8/30 - presented to ED with lethargy, found to have hypercarbia, d/c'd home after improvement.  8/31 - back to ED with worsened lethargy, ABG worse, admitted to SDU and started on BiPAP.  9/2 LE Dopplers -   Assessment and Plan:   Active Problems:  Acute on chronic respiratory failure secondary to COPD (GOLD stage IV)  - causing hypercarbic respiratory failure  - con't doxycycline for anti-inflammatory in COPD pt (5 days total)  - duonebs q4hrs x 1 day then q4-6hrs prn  - started pulmicort nebs x 3 days  - continue Prednisone tapering - LE dopplers negative for DVT Hypercarbic respiratory failure  - management as stated above  Tobacco abuse  - counseling once more awake and mentating well.  Hypokalemia - will supplement and repeat BMP in AM COPD - GOLD Stage IV  - FEV 1 30% with very little bronchodilator response  - cont with supplemental O2  - when closer to discharge then restart advair and spiriva, with PRN albuterol  Hypothyroidism  - continue synthroid  Acute on chronic macrocytic anemia  - from alcohol abuse  - stable with no sign of active bleeding  - CBC in AM  Thrombocytopenia  -  alcohol induced bone marrow damage  - continue to monitor  Severe PCM  - from underlying COPD and PSA including alcohol and cocaine  - advance diet as pt able to tolerate   DVT prophylaxis  Lovenox SQ while pt is in hospital  Code Status: Full  Family Communication: No family at bedside  Disposition Plan: Remain inpatient   IV Access:   Peripheral IV Procedures and diagnostic studies:   No results found.   Medical Consultants:   PCCM  Other Consultants:   None  Anti-Infectives:   Doxycycline 9/1 -->   Jasmine Ramsay, MD  [facility] Pager [phone]  If 7PM-7AM, please contact night-coverage [email or web address] Password [facility] 06/22/2014, 3:39 PM   LOS: 2 days   HPI/Subjective: No events overnight.   Objective: Filed Vitals:   06/21/14 2103 06/21/14 2346 06/22/14 0535 06/22/14 1318  BP: 115/55  98/67 139/70  Pulse: 96 87 76 91  Temp: 99.5 F (37.5 C)  98 F (36.7 C)   TempSrc: Oral  Oral Oral  Resp:  16 20 18   Height: 5\' 1"  (1.549 m)     Weight: 44.1 kg (97 lb 3.6 oz)     SpO2: 92% 97% 98% 97%    Intake/Output Summary (Last 24 hours) at 06/22/14 1539 Last data filed at 06/22/14 1300  Gross per 24 hour  Intake   1083 ml  Output      0 ml  Net   1083 ml  Exam:   General:  Pt is alert, follows commands appropriately, not in acute distress  Cardiovascular: Regular rate and rhythm, S1/S2, no murmurs, no rubs, no gallops  Respiratory: Poor inspiratory effort, diminished air movement at bases   Abdomen: Soft, non tender, non distended, bowel sounds present, no guarding  Extremities: No edema, pulses DP and PT palpable bilaterally  Data Reviewed: Basic Metabolic Panel:  Recent Labs Lab 06/20/14 0203 06/20/14 0535 06/21/14 0010 06/21/14 1721 06/22/14 0001  NA 144 144 140 141 139  K 4.7 4.7 4.3 3.5* 3.4*  CL 96 96 91* 89* 90*  CO2 43* 44* >45* 43* 44*  GLUCOSE 112* 138* 125* 86 129*  BUN 12 12 12 15 19   CREATININE 0.50 0.53 0.60 0.62 0.70   CALCIUM 9.5 9.5 9.3 9.3 8.4  MG  --  1.6  --   --   --   PHOS  --  2.3  --   --   --    Liver Function Tests:  Recent Labs Lab 06/18/14 2241 06/20/14 0535 06/21/14 1721  AST 28 23 21   ALT 19 16 15   ALKPHOS 70 62 64  BILITOT 0.4 0.4 0.4  PROT 6.8 6.4 6.6  ALBUMIN 3.5 3.5 3.4*   CBC:  Recent Labs Lab 06/18/14 2241 06/20/14 0203 06/20/14 0620 06/21/14 0010 06/22/14 0001  WBC 6.8 7.4 6.7 4.7 5.2  NEUTROABS 4.6  --   --   --   --   HGB 12.7 12.8 12.2 11.3* 10.8*  HCT 42.3 42.6 42.2 37.3 35.0*  MCV 107.4* 108.1* 109.9* 106.3* 102.6*  PLT 122* 110* 95* 98* 108*   Cardiac Enzymes:  Recent Labs Lab 06/19/14 0204  TROPONINI <0.30    Recent Results (from the past 240 hour(s))  URINE CULTURE     Status: None   Collection Time    06/19/14 12:24 AM      Result Value Ref Range Status   Specimen Description URINE, CLEAN CATCH   Final   Special Requests NONE   Final   Culture  Setup Time     Final   Value: 06/19/2014 09:21     Performed at [place]     Final   Value: 50,000 COLONIES/ML     Performed at [facility]   Culture     Final   Value: Multiple bacterial morphotypes present, none predominant. Suggest appropriate recollection if clinically indicated.     Performed at [facility]   Report Status 06/20/2014 FINAL   Final  MRSA PCR SCREENING     Status: None   Collection Time    06/20/14  4:49 AM      Result Value Ref Range Status   MRSA by PCR NEGATIVE  NEGATIVE Final   Comment:            The GeneXpert MRSA Assay (FDA     approved for NASAL specimens     only), is one component of a     comprehensive MRSA colonization     surveillance program. It is not     intended to diagnose MRSA     infection nor to guide or     monitor treatment for     MRSA infections.     Scheduled Meds: . antiseptic oral rinse  7 mL Mouth Rinse q12n4p  . chlorhexidine  15 mL Mouth Rinse BID  . citalopram  10 mg Oral Daily  .  docusate sodium  100 mg Oral BID  . doxycycline  100 mg Oral Q12H  . enoxaparin (LOVENOX) injection  40 mg Subcutaneous Q24H  . furosemide  40 mg Oral Daily  . guaiFENesin  600 mg Oral BID  . levothyroxine  50 mcg Oral QAC breakfast  . loratadine  10 mg Oral Daily  . mometasone-formoterol  2 puff Inhalation BID  . pantoprazole (PROTONIX) IV  40 mg Intravenous QHS  . predniSONE  40 mg Oral Q breakfast  . sodium chloride  3 mL Intravenous Q12H  . tiotropium  18 mcg Inhalation Daily   Continuous Infusions:

## 2014-06-23 DIAGNOSIS — E43 Unspecified severe protein-calorie malnutrition: Secondary | ICD-10-CM | POA: Insufficient documentation

## 2014-06-23 LAB — CBC
HCT: 37.5 % (ref 36.0–46.0)
Hemoglobin: 11.6 g/dL — ABNORMAL LOW (ref 12.0–15.0)
MCH: 31.7 pg (ref 26.0–34.0)
MCHC: 30.9 g/dL (ref 30.0–36.0)
MCV: 102.5 fL — ABNORMAL HIGH (ref 78.0–100.0)
PLATELETS: 111 10*3/uL — AB (ref 150–400)
RBC: 3.66 MIL/uL — ABNORMAL LOW (ref 3.87–5.11)
RDW: 13.7 % (ref 11.5–15.5)
WBC: 3.3 10*3/uL — ABNORMAL LOW (ref 4.0–10.5)

## 2014-06-23 LAB — BASIC METABOLIC PANEL
ANION GAP: 5 (ref 5–15)
BUN: 15 mg/dL (ref 6–23)
CALCIUM: 8.5 mg/dL (ref 8.4–10.5)
CO2: 44 mEq/L (ref 19–32)
Chloride: 88 mEq/L — ABNORMAL LOW (ref 96–112)
Creatinine, Ser: 0.64 mg/dL (ref 0.50–1.10)
GFR calc Af Amer: >90 mL/min (ref >90)
GFR, EST NON AFRICAN AMERICAN: 89 mL/min — AB (ref >90)
Glucose, Bld: 138 mg/dL — ABNORMAL HIGH (ref 70–99)
POTASSIUM: 3.8 meq/L (ref 3.7–5.3)
SODIUM: 137 meq/L (ref 137–147)

## 2014-06-23 MED ORDER — LEVALBUTEROL HCL 1.25 MG/0.5ML IN NEBU
1.2500 mg | INHALATION_SOLUTION | Freq: Three times a day (TID) | RESPIRATORY_TRACT | Status: DC
  Administered 2014-06-23 – 2014-06-25 (×8): 1.25 mg via RESPIRATORY_TRACT
  Filled 2014-06-23 (×12): qty 0.5

## 2014-06-23 NOTE — Progress Notes (Signed)
Pt placed on auto CPAP with 3L O2 bleed in.

## 2014-06-23 NOTE — Progress Notes (Signed)
Patient ID: Jasmine [name], female   DOB: 1946/04/13, 68 y.o.   MRN: [id number]  [facility] PROGRESS NOTE  GUELDA BATSON PJA:[id number] DOB: [birth year] DOA: 06/20/2014 PCP: [name], MD  Brief narrative  68 y.o. F with end stage COPD, on 6L O2 chronically. She initially presented to the ED on 8/30 with lethargy, weakness, and near syncope. She was found to have elevated pCO2 which improved while in ED and was subsequently discharged and advised to follow up with pulmonary this week. On 8/31, lethargy had worsened throughout the day prompting family to bring her in for further evaluation. In ED, ABG was obtained and pCO2 was too high to calculate. She was admitted to the SDU by [facility] and started on BiPAP. PCCM was consulted for further evaluation.   SIGNIFICANT EVENTS / STUDIES:  8/30 - presented to ED with lethargy, found to have hypercarbia, d/c'd home after improvement.  8/31 - back to ED with worsened lethargy, ABG worse, admitted to SDU and started on BiPAP.  9/2 LE Dopplers - negative for DVT   Assessment and Plan:   Active Problems:  Acute on chronic respiratory failure secondary to COPD (GOLD stage IV)  - causing hypercarbic respiratory failure  - con't doxycycline for anti-inflammatory in COPD pt (5 days total)  - duonebs q4hrs x 1 day then q4-6hrs prn  - started pulmicort nebs x 3 days  - continue Prednisone tapering  - LE dopplers negative for DVT  - appreciate PCCM input Hypercarbic respiratory failure  - management as stated above  Tobacco abuse  - counseling once more awake and mentating well.  Hypokalemia  - supplemented and WNL this AM COPD - GOLD Stage IV  - FEV 1 30% with very little bronchodilator response  - cont with supplemental O2  - when closer to discharge will restart advair and spiriva, with PRN albuterol  Hypothyroidism  - continue synthroid  Acute on chronic macrocytic anemia  - from alcohol abuse  - stable with no sign of active bleeding  -  CBC in AM  Thrombocytopenia  - alcohol induced bone marrow damage  - continue to monitor  Severe PCM  - from underlying COPD and PSA including alcohol and cocaine  - pt tolerating current diet well   DVT prophylaxis  Lovenox SQ while pt is in hospital  Code Status: Full  Family Communication: No family at bedside  Disposition Plan: Remain inpatient   IV Access:   Peripheral IV Procedures and diagnostic studies:   No results found.   Medical Consultants:   PCCM   Other Consultants:   None   Anti-Infectives:   Doxycycline 9/1 -->   [name], MD  [facility] Pager [phone]  If 7PM-7AM, please contact night-coverage [email or web address] Password [email or web address] 06/23/2014, 2:57 PM   LOS: 3 days   HPI/Subjective: No events overnight.   Objective: Filed Vitals:   06/23/14 0454 06/23/14 0813 06/23/14 1031 06/23/14 1440  BP: 127/65     Pulse: 64     Temp: 97.6 F (36.4 C)     TempSrc: Oral     Resp: 18     Height:      Weight:      SpO2: 98% 98% 98% 98%    Intake/Output Summary (Last 24 hours) at 06/23/14 1457 Last data filed at 06/23/14 1000  Gross per 24 hour  Intake    240 ml  Output      0 ml  Net    240 ml  Exam:   General:  Pt is alert, follows commands appropriately, not in acute distress  Cardiovascular: Regular rate and rhythm, S1/S2, no murmurs, no rubs, no gallops  Respiratory: Clear to auscultation bilaterally, mild end exp wheeze with diminished breath sounds at bases   Abdomen: Soft, non tender, non distended, bowel sounds present, no guarding  Data Reviewed: Basic Metabolic Panel:  Recent Labs Lab 06/20/14 0203 06/20/14 0535 06/21/14 0010 06/21/14 1721 06/22/14 0001 06/23/14 0010  NA 144 144 140 141 139 137  K 4.7 4.7 4.3 3.5* 3.4* 3.8  CL 96 96 91* 89* 90* 88*  CO2 43* 44* >45* 43* 44* 44*  GLUCOSE 112* 138* 125* 86 129* 138*  BUN 12 12 12 15 19 15   CREATININE 0.50 0.53 0.60 0.62 0.70 0.64  CALCIUM 9.5 9.5 9.3 9.3 8.4 8.5  MG   --  1.6  --   --   --   --   PHOS  --  2.3  --   --   --   --    Liver Function Tests:  Recent Labs Lab 06/18/14 2241 06/20/14 0535 06/21/14 1721  AST 28 23 21   ALT 19 16 15   ALKPHOS 70 62 64  BILITOT 0.4 0.4 0.4  PROT 6.8 6.4 6.6  ALBUMIN 3.5 3.5 3.4*   CBC:  Recent Labs Lab 06/18/14 2241 06/20/14 0203 06/20/14 0620 06/21/14 0010 06/22/14 0001 06/23/14 0010  WBC 6.8 7.4 6.7 4.7 5.2 3.3*  NEUTROABS 4.6  --   --   --   --   --   HGB 12.7 12.8 12.2 11.3* 10.8* 11.6*  HCT 42.3 42.6 42.2 37.3 35.0* 37.5  MCV 107.4* 108.1* 109.9* 106.3* 102.6* 102.5*  PLT 122* 110* 95* 98* 108* 111*   Cardiac Enzymes:  Recent Labs Lab 06/19/14 0204  TROPONINI <0.30    Recent Results (from the past 240 hour(s))  URINE CULTURE     Status: None   Collection Time    06/19/14 12:24 AM      Result Value Ref Range Status   Specimen Description URINE, CLEAN CATCH   Final   Special Requests NONE   Final   Culture  Setup Time     Final   Value: 06/19/2014 09:21     Performed at [facility]     Final   Value: 50,000 COLONIES/ML     Performed at [facility]   Culture     Final   Value: Multiple bacterial morphotypes present, none predominant. Suggest appropriate recollection if clinically indicated.     Performed at [facility]   Report Status 06/20/2014 FINAL   Final  MRSA PCR SCREENING     Status: None   Collection Time    06/20/14  4:49 AM      Result Value Ref Range Status   MRSA by PCR NEGATIVE  NEGATIVE Final   Comment:            The GeneXpert MRSA Assay (FDA     approved for NASAL specimens     only), is one component of a     comprehensive MRSA colonization     surveillance program. It is not     intended to diagnose MRSA     infection nor to guide or     monitor treatment for     MRSA infections.     Scheduled Meds: . antiseptic oral rinse  7 mL Mouth Rinse q12n4p  .  chlorhexidine  15 mL Mouth Rinse BID  . citalopram  10  mg Oral Daily  . docusate sodium  100 mg Oral BID  . doxycycline  100 mg Oral Q12H  . enoxaparin (LOVENOX) injection  40 mg Subcutaneous Q24H  . feeding supplement (ENSURE COMPLETE)  237 mL Oral Q24H  . furosemide  40 mg Oral Daily  . guaiFENesin  600 mg Oral BID  . levalbuterol  1.25 mg Nebulization TID  . levothyroxine  50 mcg Oral QAC breakfast  . loratadine  10 mg Oral Daily  . mometasone-formoterol  2 puff Inhalation BID  . predniSONE  30 mg Oral Q breakfast  . sodium chloride  3 mL Intravenous Q12H  . tiotropium  18 mcg Inhalation Daily   Continuous Infusions:

## 2014-06-23 NOTE — Progress Notes (Signed)
Clinical Social Work Department BRIEF PSYCHOSOCIAL ASSESSMENT 06/23/2014  Patient:  Jasmine [name], Jasmine [name]     Account Number:  [id number]     Admit date:  06/20/2014  Clinical Social Worker:  Renold Genta  Date/Time:  06/23/2014 03:39 PM  Referred by:  Physician  Date Referred:  06/23/2014 Referred for  ALF Placement   Other Referral:   Interview type:  Patient Other interview type:   and APS worker, Jearld Lesch via phone (ph#: [phone] c#: [phone])    PSYCHOSOCIAL DATA Living Status:  [place] Admitted from facility:   Level of care:   Primary support name:  Willene Hatchet (niece) h#: [phone]  [phone] Primary support relationship to patient:  FAMILY Degree of support available:   good    CURRENT CONCERNS Current Concerns  Post-Acute Placement   Other Concerns:    SOCIAL WORK ASSESSMENT / PLAN CSW received call from [place] worker, Mechele Claude that she received a call that patient needs placement.   Assessment/plan status:  Information/Referral to [employer] Other assessment/ plan:   Information/referral to community resources:   CSW completed FL2 and faxed information to ALFs - provided ALF list to patient & called around for rates.    PATIENT'S/FAMILY'S RESPONSE TO PLAN OF CARE: CSW provided patient with list of ALFs and called around to find out rates for patient. Patient informed CSW that she had toured [facility] in the past and was interested in going there. Per APS worker, patient's husband has been placed at [facility] in [place] since patient was admitted to [facility]. CSW called Juliann Pulse @ [facility] with patient to discuss patient moving in. CSW spoke with patient & niece, [name] (ph#: [phone]) re: ALF/Independent Living Facilities - CSW left message for APS worker, Arville Go to make aware of patient's plans to return home at discharge until arrangements can be made to move into independent living.       Raynaldo Opitz, [facility] Clinical Social Worker cell #: [phone]

## 2014-06-23 NOTE — Evaluation (Signed)
Physical Therapy Evaluation Patient Details Name: Jasmine [name] MRN: [id number] DOB: May 09, 1946 Today's Date: 06/23/2014   History of Present Illness  68 y.o. female with end stage COPD, reports being on 6L O2 chronically, hx of back pain, R lumbar radiculopathy, R femur fx per pt who was admitted 9/1 with lethargy, weakness, and near syncope.  Pt diagnosed with acute on chronic respiratory failure secondary to COPD (GOLD stage IV)   Clinical Impression  Pt currently with functional limitations due to the deficits listed below (see PT Problem List).  Pt will benefit from skilled PT to increase their independence and safety with mobility to allow discharge to the venue listed below.  Pt ambulated in hallway on 3L O2 [place] with RW and very agreeable to HHPT.      Follow Up Recommendations Home health PT    Equipment Recommendations  Rolling walker with 5" wheels (if pt unable to get her RW from attic)    Recommendations for Other Services       Precautions / Restrictions Precautions Precautions: Fall Precaution Comments: chronic O2      Mobility  Bed Mobility Overal bed mobility: Modified Independent                Transfers Overall transfer level: Needs assistance Equipment used: None Transfers: Sit to/from Stand Sit to Stand: Min guard            Ambulation/Gait Ambulation/Gait assistance: Min assist;Min guard Ambulation Distance (Feet): 200 Feet Assistive device: Rolling walker (2 wheeled) Gait Pattern/deviations: Decreased stance time - right;Antalgic;Step-through pattern     General Gait Details: ambulated on 3L O2 with SpO2 92%, pt reports R hip/thigh pain from old femur fx and unsteady in room so provided RW with improvement in stability and pain  Stairs            Wheelchair Mobility    Modified Rankin (Stroke Patients Only)       Balance                                             Pertinent Vitals/Pain Pain  Assessment: 0-10 Pain Score: 5  Pain Location: R hip/thigh during gait Pain Descriptors / Indicators: Aching;Sore Pain Intervention(s): Limited activity within patient's tolerance;Monitored during session;Repositioned    Home Living Family/patient expects to be discharged to:: Private residence Living Arrangements: Alone   Type of Home: House       Home Layout: One level Home Equipment: Environmental consultant - 2 wheels Additional Comments: pt reports RW in her attic    Prior Function Level of Independence: Independent         Comments: chronic 6L O2     Hand Dominance        Extremity/Trunk Assessment               Lower Extremity Assessment: Generalized weakness (reports hx of R femur fx with occasional pain)         Communication   Communication: No difficulties  Cognition Arousal/Alertness: Awake/alert Behavior During Therapy: WFL for tasks assessed/performed Overall Cognitive Status: Within Functional Limits for tasks assessed                      General Comments      Exercises        Assessment/Plan    PT Assessment Patient needs continued PT services  PT Diagnosis Difficulty walking;Generalized weakness   PT Problem List Decreased strength;Decreased mobility;Decreased knowledge of use of DME  PT Treatment Interventions DME instruction;Gait training;Functional mobility training;Therapeutic activities;Therapeutic exercise;Patient/family education   PT Goals (Current goals can be found in the Care Plan section) Acute Rehab PT Goals Patient Stated Goal: home with HHPT PT Goal Formulation: With patient Time For Goal Achievement: 06/30/14 Potential to Achieve Goals: Good    Frequency Min 3X/week   Barriers to discharge        Co-evaluation               End of Session Equipment Utilized During Treatment: Gait belt Activity Tolerance: Patient tolerated treatment well Patient left: in chair;with call bell/phone within reach Nurse  Communication: Mobility status         Time: 1245-8099 PT Time Calculation (min): 16 min   Charges:   PT Evaluation $Initial PT Evaluation Tier I: 1 Procedure PT Treatments $Gait Training: 8-22 mins   PT G Codes:          Dymond Spreen,KATHrine E 06/23/2014, 11:09 AM Carmelia Bake, PT, DPT 06/23/2014 Pager: [phone]

## 2014-06-23 NOTE — Progress Notes (Signed)
CRITICAL VALUE ALERT  Critical value received:  CO2- 44  Date of notification:  06/23/14  Time of notification:  0102  Critical value read back:Yes.    Nurse who received alert:  M. Marybel Alcott RN  MD notified (1st page):  Fredirick Maudlin  Time of first page:  0104  MD notified (2nd page):  Time of second page:  Responding MD:  Fredirick Maudlin  Time MD responded:  [phone]

## 2014-06-23 NOTE — Progress Notes (Signed)
Name: Jasmine [name] MRN: [id number] DOB: 07-02-1946    ADMISSION DATE:  06/20/2014 CONSULTATION DATE:  06/20/2014  REFERRING MD :  Roel Cluck PRIMARY SERVICE:  TRH  CHIEF COMPLAINT:  Hypercarbic respiratory failure  BRIEF PATIENT DESCRIPTION: 68 y.o. F with PMH of end stage COPD on 6L O2 chronically (followed by RA) presents with hypercarbic respiratory failure.  Admitted to SDU by TRU and started on BiPAP.    SIGNIFICANT EVENTS / STUDIES:  8/30 - presented to ED with lethargy, found to have hypercarbia, d/c'd home after improvement. 8/31 - back to ED with worsened lethargy, ABG worse, admitted to SDU and started on BiPAP. 9/2 LE Dopplers >>> pending  LINES / TUBES: PIV  CULTURES: None  ANTIBIOTICS: Doxy 9/1 >>> (stop date 9/7)  HISTORY OF PRESENT ILLNESS:  Pt is encephelopathic; therefore, this HPI is obtained from chart review. Jasmine [name] is a 68 y.o. F with PMH as outlined below which includes end stage COPD (followed by RA), on 6L O2 chronically.  She initially presented to the ED on 8/30 with lethargy, weakness, and near syncope.  She was found to have elevated pCO2 which improved while in ED and was subsequently discharged and advised to follow up with pulmonary this week. On 8/31, lethargy had worsened throughout the day prompting family to bring her in for further evaluation.  In ED, ABG was obtained and pCO2 was too high to calculate. She was admitted to the SDU by [facility] and started on BiPAP.  PCCM was consulted for further evaluation.  Repeat ABG pending.  PFT's from 03/01/13:  FEV .47 pre / .60 post, FEV1/FVC  39 pre / 44 post.  PAST MEDICAL HISTORY :  Past Medical History  Diagnosis Date  . ALLERGIC RHINITIS 05/01/2007  . ASTHMA 05/01/2007  . BACK PAIN 10/10/2008  . DEPRESSION 07/21/2008  . FATIGUE 10/10/2010  . HYPERTENSION 05/01/2007  . HYPERTHYROIDISM 11/23/2007    Pt endorses having had Graves disease, possibly radioactive iodine x1, but no thyroidectomy  .  HYPOTHYROIDISM 08/23/2009  . LUMBAR RADICULOPATHY, RIGHT 08/25/2008  . OSTEOPOROSIS 05/01/2007  . SHOULDER PAIN, LEFT 08/23/2009  . SINUSITIS, CHRONIC 10/10/2010  . SINUSITIS- ACUTE-NOS 10/10/2008  . H/O blood clots     left leg 1960's   Past Surgical History  Procedure Laterality Date  . Appendectomy    . Ectopic pregnancy surgery    . Post fallopian tube removal right side    . History of multiple dialations and curettages and miscarriages, unfortunately never carrying a child to term    . Electrocardiogram  06/20/2006  . Tubal ligation     Prior to Admission medications   Medication Sig Start Date End Date Taking? Authorizing Provider  albuterol (PROVENTIL HFA;VENTOLIN HFA) 108 (90 BASE) MCG/ACT inhaler Inhale 2 puffs into the lungs every 6 (six) hours as needed for wheezing or shortness of breath.   Yes Historical Provider, MD  albuterol (PROVENTIL) (2.5 MG/3ML) 0.083% nebulizer solution Take 2.5 mg by nebulization every 6 (six) hours as needed for wheezing.   Yes Historical Provider, MD  cetirizine (ZYRTEC) 10 MG tablet Take 10 mg by mouth daily.   Yes Historical Provider, MD  citalopram (CELEXA) 10 MG tablet Take 10 mg by mouth daily.   Yes Historical Provider, MD  furosemide (LASIX) 40 MG tablet Take 40 mg by mouth daily.   Yes Historical Provider, MD  levothyroxine (SYNTHROID, LEVOTHROID) 50 MCG tablet Take 50 mcg by mouth daily before breakfast.   Yes Historical Provider,  MD  naproxen sodium (ANAPROX) 220 MG tablet Take 220 mg by mouth 2 (two) times daily as needed (pain).    Yes Historical Provider, MD  potassium chloride (K-DUR) 10 MEQ tablet Take 10 mEq by mouth 2 (two) times daily.   Yes Historical Provider, MD  tiotropium (SPIRIVA) 18 MCG inhalation capsule Place 18 mcg into inhaler and inhale daily.   Yes Historical Provider, MD  triamcinolone cream (KENALOG) 0.1 % Apply 1 application topically 2 (two) times daily as needed ("break out on arms").   Yes Historical Provider, MD    Allergies  Allergen Reactions  . Alendronate Sodium Other (See Comments)    FAMILY HISTORY:  Family History  Problem Relation Age of Onset  . Alcohol abuse Brother   . Diabetes Brother   . Hypertension Other   . Lung cancer Father   . Stroke Mother   . Asthma      maternal aunts   SOCIAL HISTORY:  reports that she has been smoking Cigarettes.  She has a 2 pack-year smoking history. She does not have any smokeless tobacco history on file. She reports that she drinks about 1.2 ounces of alcohol per week. She reports that she uses illicit drugs (Cocaine) about twice per week.  REVIEW OF SYSTEMS:  Unable to obtain as pt is encephalopathic.  SUBJECTIVE: doing well this morning, back to baseline breathing, no major respiratory complaints.   VITAL SIGNS: Temp:  [97.6 F (36.4 C)-98.3 F (36.8 C)] 97.6 F (36.4 C) (09/04 0454) Pulse Rate:  [64-91] 64 (09/04 0454) Resp:  [18] 18 (09/04 0454) BP: (110-139)/(58-70) 127/65 mmHg (09/04 0454) SpO2:  [97 %-98 %] 98 % (09/04 1031)  PHYSICAL EXAMINATION: General: Elderly female, resting in bed, in NAD. Neuro: Somnolent but arouses to voice, answers questions appropriately once awakened, A&O x 3. HEENT: [place]/AT. PERRL, sclerae anicteric.  Cardiovascular: RRR, no M/R/G.  Lungs:  Dec BS at the bases bilateral, very fine exp wheeze at bases (improved) prolonged exp phase Abdomen: BS x 4, soft, NT/ND.  Musculoskeletal: No gross deformities, 1+ edema L > R. Skin: Intact, warm, no rashes.   Recent Labs Lab 06/21/14 1721 06/22/14 0001 06/23/14 0010  NA 141 139 137  K 3.5* 3.4* 3.8  CL 89* 90* 88*  CO2 43* 44* 44*  BUN 15 19 15   CREATININE 0.62 0.70 0.64  GLUCOSE 86 129* 138*    Recent Labs Lab 06/21/14 0010 06/22/14 0001 06/23/14 0010  HGB 11.3* 10.8* 11.6*  HCT 37.3 35.0* 37.5  WBC 4.7 5.2 3.3*  PLT 98* 108* 111*   No results found.  ASSESSMENT / PLAN:  68 yo female with PMHx of COPD (Severe type), Tobacco abuse,  Drug abuse, admitted for acute respiratory failure requiring bipap, PCCM consulted for management and treatments.  1. AECOPD - resolving - causing hypercarbic respiratory failure, which is now improving, she will be a chronic CO2 retainer - con't doxycycline for anti-inflammatory effects in COPD pt (5 days total)  - duonebs q4hrs x 1 day then q4-6hrs prn  - cont pulmicort nebs x 3 days total (this will expire off her MAR) - cont with cpap (4 hours on and 4hour off) x 1-2 days, she wears bipap\cpap at home - dc IV steroids x 2 days, switched to PO steroids (Prednisone 40mg  x 5 days total, does not require a taper). - bronchodilators adjusted (Duonebs q8hrs and q4hrs prn, while in hospital) - home inhalers restarted (Spiriva, Dulera, prn albuterol) - cont with home O2 -  limit use of narcotics and benzos  - f\u LE dopplers  - monitor I\O carefully, do not over diuresis given hx of cor pulmonale an dilated RV, she will need some preload   2. Hypercarbic respiratory failure  - management as stated above  3. Tobacco abuse  - counseling once more awake and mentating well.   4. COPD - GOLD Stage IV  - FEV 1 30% with very little bronchodilator response  - cont with supplemental O2 (6L continuously)  - restarted dulera and spiriva, with PRN albuterol - f\u with [facility] (Dr. Rockwell Alexandria) for COPD as an outpt in 2-3 wks after discharge.    Thank you for consulting [facility], we will signoff at this time, please call with any questions.   ConsutltTime = 35 mins  Vilinda Boehringer, MD [facility] Pager [phone] On Call Pager [phone]

## 2014-06-23 NOTE — Progress Notes (Signed)
Spoke with pt concerning discharge plans. Pt selected Jasmine [name], referral given.

## 2014-06-24 LAB — CBC
HCT: 35.3 % — ABNORMAL LOW (ref 36.0–46.0)
Hemoglobin: 11.4 g/dL — ABNORMAL LOW (ref 12.0–15.0)
MCH: 32.1 pg (ref 26.0–34.0)
MCHC: 32.3 g/dL (ref 30.0–36.0)
MCV: 99.4 fL (ref 78.0–100.0)
PLATELETS: 127 10*3/uL — AB (ref 150–400)
RBC: 3.55 MIL/uL — AB (ref 3.87–5.11)
RDW: 13.7 % (ref 11.5–15.5)
WBC: 5 10*3/uL (ref 4.0–10.5)

## 2014-06-24 LAB — BASIC METABOLIC PANEL
ANION GAP: 3 — AB (ref 5–15)
BUN: 14 mg/dL (ref 6–23)
CHLORIDE: 91 meq/L — AB (ref 96–112)
CO2: 44 meq/L — AB (ref 19–32)
Calcium: 8.9 mg/dL (ref 8.4–10.5)
Creatinine, Ser: 0.6 mg/dL (ref 0.50–1.10)
GFR calc Af Amer: >90 mL/min (ref >90)
GFR calc non Af Amer: >90 mL/min (ref >90)
Glucose, Bld: 88 mg/dL (ref 70–99)
Potassium: 4.3 mEq/L (ref 3.7–5.3)
SODIUM: 138 meq/L (ref 137–147)

## 2014-06-24 MED ORDER — PREDNISONE 20 MG PO TABS
20.0000 mg | ORAL_TABLET | Freq: Every day | ORAL | Status: DC
  Administered 2014-06-25: 20 mg via ORAL
  Filled 2014-06-24 (×2): qty 1

## 2014-06-24 NOTE — Progress Notes (Signed)
CRITICAL VALUE ALERT  Critical value received: CO2-44  Date of notification:  06/24/14  Time of notification:  0100  Critical value read back:Yes.    Nurse who received alert:  E Barrett Henle  MD notified (1st page):  Fredirick Maudlin NP  Time of first page:  0115  MD notified (2nd page):  Time of second page:  Responding MD:  Fredirick Maudlin NP  Time MD responded:  [phone]

## 2014-06-24 NOTE — Progress Notes (Addendum)
Patient ID: MYRTIE LEUTHOLD, female   DOB: 11/30/1945, 68 y.o.   MRN: [id number]  [facility] PROGRESS NOTE  KYANDRA MCCLAINE YBW:[id number] DOB: Feb 23, 1946 DOA: 06/20/2014 PCP: [name], MD  Brief narrative  68 y.o. F with end stage COPD, on 6L O2 chronically. She initially presented to the ED on 8/30 with lethargy, weakness, and near syncope. She was found to have elevated pCO2 which improved while in ED and was subsequently discharged and advised to follow up with pulmonary this week. On 8/31, lethargy had worsened throughout the day prompting family to bring her in for further evaluation. In ED, ABG was obtained and pCO2 was too high to calculate. She was admitted to the SDU by [facility] and started on BiPAP. PCCM was consulted for further evaluation.   SIGNIFICANT EVENTS / STUDIES:  8/30 - presented to ED with lethargy, found to have hypercarbia, d/c'd home after improvement.  8/31 - back to ED with worsened lethargy, ABG worse, admitted to SDU and started on BiPAP.  9/2 LE Dopplers - negative for DVT   Assessment and Plan:   Active Problems:  Acute on chronic respiratory failure secondary to COPD (GOLD stage IV)  - causing hypercarbic respiratory failure  - con't doxycycline for anti-inflammatory in COPD pt, stop after today's dose  - duonebs q4hrs x 1 day then q4-6hrs prn  - started pulmicort nebs x 3 days  - continue Prednisone tapering  - LE dopplers negative for DVT  - appreciate PCCM input  Hypercarbic respiratory failure  - management as stated above  Tobacco abuse  - counseling once more awake and mentating well.  Hypokalemia  - supplemented and WNL this AM  COPD - GOLD Stage IV  - FEV 1 30% with very little bronchodilator response  - cont with supplemental O2  - when closer to discharge will restart advair and spiriva, with PRN albuterol  Hypothyroidism  - continue synthroid  Acute on chronic macrocytic anemia  - from alcohol abuse  - stable with no sign of active  bleeding  Thrombocytopenia  - alcohol induced bone marrow damage  - continue to monitor  Severe PCM  - from underlying COPD and PSA including alcohol and cocaine  - pt tolerating current diet well   DVT prophylaxis  Lovenox SQ while pt is in hospital  Code Status: Full  Family Communication: No family at bedside  Disposition Plan: D/C in , pt says she can have family pick her up tomorrow   IV Access:   Peripheral IV Procedures and diagnostic studies:   No results found.   Medical Consultants:   PCCM   Other Consultants:   None   Anti-Infectives:   Doxycycline 9/1 --> 9/5    [name], MD  [facility] Pager [phone]  If 7PM-7AM, please contact night-coverage [email or web address] Password [email or web address] 06/24/2014, 2:19 PM   LOS: 4 days   HPI/Subjective: No events overnight.   Objective: Filed Vitals:   06/23/14 2248 06/24/14 0450 06/24/14 0807 06/24/14 0900  BP:  112/69    Pulse: 70 69    Temp:  98 F (36.7 C)    TempSrc:  Oral    Resp: 16 18    Height:      Weight:      SpO2: 95% 100% 99% 100%    Intake/Output Summary (Last 24 hours) at 06/24/14 1419 Last data filed at 06/24/14 0900  Gross per 24 hour  Intake    480 ml  Output  0 ml  Net    480 ml    Exam:   General:  Pt is alert, follows commands appropriately, not in acute distress  Cardiovascular: Regular rate and rhythm, S1/S2, no murmurs, no rubs, no gallops  Respiratory: Poor inspiratory effort, mild exp wheeze   Abdomen: Soft, non tender, non distended, bowel sounds present, no guarding  Data Reviewed: Basic Metabolic Panel:  Recent Labs Lab 06/20/14 0203 06/20/14 0535 06/21/14 0010 06/21/14 1721 06/22/14 0001 06/23/14 0010 06/24/14 0015  NA 144 144 140 141 139 137 138  K 4.7 4.7 4.3 3.5* 3.4* 3.8 4.3  CL 96 96 91* 89* 90* 88* 91*  CO2 43* 44* >45* 43* 44* 44* 44*  GLUCOSE 112* 138* 125* 86 129* 138* 88  BUN 12 12 12 15 19 15 14   CREATININE 0.50 0.53 0.60 0.62 0.70 0.64 0.60   CALCIUM 9.5 9.5 9.3 9.3 8.4 8.5 8.9  MG  --  1.6  --   --   --   --   --   PHOS  --  2.3  --   --   --   --   --    Liver Function Tests:  Recent Labs Lab 06/18/14 2241 06/20/14 0535 06/21/14 1721  AST 28 23 21   ALT 19 16 15   ALKPHOS 70 62 64  BILITOT 0.4 0.4 0.4  PROT 6.8 6.4 6.6  ALBUMIN 3.5 3.5 3.4*   No results found for this basename: LIPASE, AMYLASE,  in the last 168 hours No results found for this basename: AMMONIA,  in the last 168 hours CBC:  Recent Labs Lab 06/18/14 2241  06/20/14 0620 06/21/14 0010 06/22/14 0001 06/23/14 0010 06/24/14 0015  WBC 6.8  < > 6.7 4.7 5.2 3.3* 5.0  NEUTROABS 4.6  --   --   --   --   --   --   HGB 12.7  < > 12.2 11.3* 10.8* 11.6* 11.4*  HCT 42.3  < > 42.2 37.3 35.0* 37.5 35.3*  MCV 107.4*  < > 109.9* 106.3* 102.6* 102.5* 99.4  PLT 122*  < > 95* 98* 108* 111* 127*  < > = values in this interval not displayed. Cardiac Enzymes:  Recent Labs Lab 06/19/14 0204  TROPONINI <0.30   BNP: No components found with this basename: POCBNP,  CBG: No results found for this basename: GLUCAP,  in the last 168 hours  Recent Results (from the past 240 hour(s))  URINE CULTURE     Status: None   Collection Time    06/19/14 12:24 AM      Result Value Ref Range Status   Specimen Description URINE, CLEAN CATCH   Final   Special Requests NONE   Final   Culture  Setup Time     Final   Value: 06/19/2014 09:21     Performed at [place]     Final   Value: 50,000 COLONIES/ML     Performed at [facility]   Culture     Final   Value: Multiple bacterial morphotypes present, none predominant. Suggest appropriate recollection if clinically indicated.     Performed at [facility]   Report Status 06/20/2014 FINAL   Final  MRSA PCR SCREENING     Status: None   Collection Time    06/20/14  4:49 AM      Result Value Ref Range Status   MRSA by PCR NEGATIVE  NEGATIVE Final  Comment:            The  GeneXpert MRSA Assay (FDA     approved for NASAL specimens     only), is one component of a     comprehensive MRSA colonization     surveillance program. It is not     intended to diagnose MRSA     infection nor to guide or     monitor treatment for     MRSA infections.     Scheduled Meds: . antiseptic oral rinse  7 mL Mouth Rinse q12n4p  . chlorhexidine  15 mL Mouth Rinse BID  . citalopram  10 mg Oral Daily  . docusate sodium  100 mg Oral BID  . doxycycline  100 mg Oral Q12H  . enoxaparin (LOVENOX) injection  40 mg Subcutaneous Q24H  . feeding supplement (ENSURE COMPLETE)  237 mL Oral Q24H  . furosemide  40 mg Oral Daily  . guaiFENesin  600 mg Oral BID  . levalbuterol  1.25 mg Nebulization TID  . levothyroxine  50 mcg Oral QAC breakfast  . loratadine  10 mg Oral Daily  . mometasone-formoterol  2 puff Inhalation BID  . predniSONE  30 mg Oral Q breakfast  . sodium chloride  3 mL Intravenous Q12H  . tiotropium  18 mcg Inhalation Daily   Continuous Infusions:

## 2014-06-25 ENCOUNTER — Inpatient Hospital Stay (HOSPITAL_COMMUNITY): Payer: Medicare HMO

## 2014-06-25 MED ORDER — HYDROCODONE-ACETAMINOPHEN 5-325 MG PO TABS: 1.0000 | ORAL_TABLET | ORAL | Status: DC | PRN

## 2014-06-25 MED ORDER — FLUTICASONE-SALMETEROL 250-50 MCG/DOSE IN AEPB: 1.0000 | INHALATION_SPRAY | Freq: Two times a day (BID) | RESPIRATORY_TRACT | Status: DC

## 2014-06-25 MED ORDER — PREDNISONE 10 MG PO TABS
10.0000 mg | ORAL_TABLET | Freq: Every day | ORAL | Status: DC
  Filled 2014-06-25: qty 1

## 2014-06-25 MED ORDER — MOMETASONE FURO-FORMOTEROL FUM 200-5 MCG/ACT IN AERO: 2.0000 | INHALATION_SPRAY | Freq: Two times a day (BID) | RESPIRATORY_TRACT | Status: DC

## 2014-06-25 MED ORDER — ALBUTEROL SULFATE (2.5 MG/3ML) 0.083% IN NEBU: 2.5000 mg | INHALATION_SOLUTION | RESPIRATORY_TRACT | Status: DC | PRN

## 2014-06-25 NOTE — Progress Notes (Signed)
RT placed patient on CPAP auto titrate ( min 5 and max 20 ). Sterile water added to water chamber for humidification. 3 liters oxygen bleed into tubing. Patient is tolerating well. RT will monitor and assess  as needed.

## 2014-06-25 NOTE — Progress Notes (Signed)
CARE MANAGEMENT NOTE 06/25/2014  Patient:  IRYS, NIGH   Account Number:  [id number]  Date Initiated:  06/20/2014  Documentation initiated by:  DAVIS,RHONDA  Subjective/Objective Assessment:   history of copd and home )2 at 6l./min,  increased dyspnea and retaining co2 admitted and placed on bipap.     Action/Plan:   lives at home with spouse/ has a good family support in place.Her main support group are her 69 year old sister and niece.   Anticipated DC Date:  06/23/2014   Anticipated DC Plan:  HOME/SELF CARE  In-house referral  NA      DC Planning Services  CM consult      PAC Choice  NA   Choice offered to / List presented to:  NA   DME arranged  NA      DME agency  NA     [place] arranged  HH-1 RN  [place].   Status of service:  Completed, signed off Medicare Important Message given?  YES (If response is "NO", the following Medicare IM given date fields will be blank) Date Medicare IM given:  06/23/2014 Medicare IM given by:  Gabriel Earing Date Additional Medicare IM given:   Additional Medicare IM given by:    Discharge Disposition:  [place]  Per UR Regulation:  Reviewed for med. necessity/level of care/duration of stay  If discussed at [place] of Stay Meetings, dates discussed:    Comments:  06/25/2014 1345 Notified [facility] of scheduled dc home today with HH. Jonnie Finner RN CCM Case Mgmt [phone]  06/23/14 MMcGibboney, RN, BSN Spoke with pt concerning discharge plans. Pt selected [place], referral given.  Rhonda Davis,RN,BSN,CCM

## 2014-06-25 NOTE — Discharge Instructions (Signed)

## 2014-06-25 NOTE — Discharge Summary (Signed)
Physician Discharge Summary  ANALYSE ANGST IOE:[id number] DOB: 1946/04/06 DOA: 06/20/2014  PCP: [name], MD  Admit date: 06/20/2014 Discharge date: 06/25/2014  Recommendations for Outpatient Follow-up:  1. Pt will need to follow up with PCP in 2-3 weeks post discharge 2. Please obtain BMP to evaluate electrolytes and kidney function 3. Please also check CBC to evaluate Hg and Hct levels  Discharge Diagnoses:  Active Problems:   HYPERTENSION   COPD (chronic obstructive pulmonary disease)   Smoker   Hypercarbia   Protein-calorie malnutrition, severe  Discharge Condition: Stable  Diet recommendation: Heart healthy diet discussed in details   Brief narrative  68 y.o. F with end stage COPD, on 6L O2 chronically. She initially presented to the ED on 8/30 with lethargy, weakness, and near syncope. She was found to have elevated pCO2 which improved while in ED and was subsequently discharged and advised to follow up with pulmonary this week. On 8/31, lethargy had worsened throughout the day prompting family to bring her in for further evaluation. In ED, ABG was obtained and pCO2 was too high to calculate. She was admitted to the SDU by [facility] and started on BiPAP. PCCM was consulted for further evaluation.   SIGNIFICANT EVENTS / STUDIES:  8/30 - presented to ED with lethargy, found to have hypercarbia, d/c'd home after improvement.  8/31 - back to ED with worsened lethargy, ABG worse, admitted to SDU and started on BiPAP.  9/2 LE Dopplers - negative for DVT   Assessment and Plan:   Active Problems:  Acute on chronic respiratory failure secondary to COPD (GOLD stage IV)  - causing hypercarbic respiratory failure  - completed tx with doxycycline for 5 days  - duonebs q4hrs x 1 day then q4-6hrs prn  - completed Prednisone tapering  - LE dopplers negative for DVT  - appreciate PCCM input  Hypercarbic respiratory failure  - management as stated above  Tobacco abuse  - counseling once more  awake and mentating well.  Hypokalemia  - supplemented and WNL this AM  COPD - GOLD Stage IV  - FEV 1 30% with very little bronchodilator response  - cont with supplemental O2  Hypothyroidism  - continue synthroid  Acute on chronic macrocytic anemia  - from alcohol abuse  - stable with no sign of active bleeding  Thrombocytopenia  - alcohol induced bone marrow damage  - continue to monitor  Severe PCM  - from underlying COPD and PSA including alcohol and cocaine  - pt tolerating current diet well   Code Status: Full  Family Communication: No family at bedside  Disposition Plan: home  IV Access:   Peripheral IV Procedures and diagnostic studies:   No results found.   Medical Consultants:   PCCM   Other Consultants:   None   Anti-Infectives:   Doxycycline 9/1 --> 9/5   Discharge Exam: Filed Vitals:   06/25/14 0608  BP: 119/73  Pulse: 58  Temp: 97.8 F (36.6 C)  Resp: 20   Filed Vitals:   06/24/14 2011 06/24/14 2107 06/25/14 0608 06/25/14 0804  BP:  110/59 119/73   Pulse:  84 58   Temp:  98.5 F (36.9 C) 97.8 F (36.6 C)   TempSrc:  Oral Oral   Resp:  20 20   Height:      Weight:      SpO2: 94% 100% 100% 100%    General: Pt is alert, follows commands appropriately, not in acute distress Cardiovascular: Regular rate and rhythm,  S1/S2 +, no murmurs, no rubs, no gallops Respiratory: Clear to auscultation bilaterally, no wheezing Abdominal: Soft, non tender, non distended, bowel sounds +, no guarding  Discharge Instructions     Medication List         albuterol 108 (90 BASE) MCG/ACT inhaler  Commonly known as:  PROVENTIL HFA;VENTOLIN HFA  Inhale 2 puffs into the lungs every 6 (six) hours as needed for wheezing or shortness of breath.     albuterol (2.5 MG/3ML) 0.083% nebulizer solution  Commonly known as:  PROVENTIL  Take 3 mLs (2.5 mg total) by nebulization every 4 (four) hours as needed for wheezing.     cetirizine 10 MG tablet  Commonly  known as:  ZYRTEC  Take 10 mg by mouth daily.     citalopram 10 MG tablet  Commonly known as:  CELEXA  Take 10 mg by mouth daily.     furosemide 40 MG tablet  Commonly known as:  LASIX  Take 40 mg by mouth daily.     HYDROcodone-acetaminophen 5-325 MG per tablet  Commonly known as:  NORCO/VICODIN  Take 1-2 tablets by mouth every 4 (four) hours as needed for moderate pain.     levothyroxine 50 MCG tablet  Commonly known as:  SYNTHROID, LEVOTHROID  Take 50 mcg by mouth daily before breakfast.     mometasone-formoterol 200-5 MCG/ACT Aero  Commonly known as:  DULERA  Inhale 2 puffs into the lungs 2 (two) times daily.     naproxen sodium 220 MG tablet  Commonly known as:  ANAPROX  Take 220 mg by mouth 2 (two) times daily as needed (pain).     potassium chloride 10 MEQ tablet  Commonly known as:  K-DUR  Take 10 mEq by mouth 2 (two) times daily.     tiotropium 18 MCG inhalation capsule  Commonly known as:  SPIRIVA  Place 18 mcg into inhaler and inhale daily.     triamcinolone cream 0.1 %  Commonly known as:  KENALOG  Apply 1 application topically 2 (two) times daily as needed ("break out on arms").            Follow-up Information   Schedule an appointment as soon as possible for a visit with [name], MD.   Specialties:  Internal Medicine, Radiology   Contact information:   [address] [place] [address] [phone]       Follow up with [name], MD. (As needed call my cell phone [phone])    Specialty:  Internal Medicine   Contact information:   [address] [place] [address] [phone]        The results of significant diagnostics from this hospitalization (including imaging, microbiology, ancillary and laboratory) are listed below for reference.     Microbiology: Recent Results (from the past 240 hour(s))  URINE CULTURE     Status: None   Collection Time    06/19/14 12:24 AM      Result Value  Ref Range Status   Specimen Description URINE, CLEAN CATCH   Final   Special Requests NONE   Final   Culture  Setup Time     Final   Value: 06/19/2014 09:21     Performed at [facility] Count     Final   Value: 50,000 COLONIES/ML     Performed at [facility]   Culture     Final   Value: Multiple bacterial morphotypes  present, none predominant. Suggest appropriate recollection if clinically indicated.     Performed at [facility]   Report Status 06/20/2014 FINAL   Final  MRSA PCR SCREENING     Status: None   Collection Time    06/20/14  4:49 AM      Result Value Ref Range Status   MRSA by PCR NEGATIVE  NEGATIVE Final   Comment:            The GeneXpert MRSA Assay (FDA     approved for NASAL specimens     only), is one component of a     comprehensive MRSA colonization     surveillance program. It is not     intended to diagnose MRSA     infection nor to guide or     monitor treatment for     MRSA infections.     Labs: Basic Metabolic Panel:  Recent Labs Lab 06/20/14 0203 06/20/14 0535 06/21/14 0010 06/21/14 1721 06/22/14 0001 06/23/14 0010 06/24/14 0015  NA 144 144 140 141 139 137 138  K 4.7 4.7 4.3 3.5* 3.4* 3.8 4.3  CL 96 96 91* 89* 90* 88* 91*  CO2 43* 44* >45* 43* 44* 44* 44*  GLUCOSE 112* 138* 125* 86 129* 138* 88  BUN 12 12 12 15 19 15 14   CREATININE 0.50 0.53 0.60 0.62 0.70 0.64 0.60  CALCIUM 9.5 9.5 9.3 9.3 8.4 8.5 8.9  MG  --  1.6  --   --   --   --   --   PHOS  --  2.3  --   --   --   --   --    Liver Function Tests:  Recent Labs Lab 06/18/14 2241 06/20/14 0535 06/21/14 1721  AST 28 23 21   ALT 19 16 15   ALKPHOS 70 62 64  BILITOT 0.4 0.4 0.4  PROT 6.8 6.4 6.6  ALBUMIN 3.5 3.5 3.4*   No results found for this basename: LIPASE, AMYLASE,  in the last 168 hours No results found for this basename: AMMONIA,  in the last 168 hours CBC:  Recent Labs Lab 06/18/14 2241  06/20/14 0620 06/21/14 0010  06/22/14 0001 06/23/14 0010 06/24/14 0015  WBC 6.8  < > 6.7 4.7 5.2 3.3* 5.0  NEUTROABS 4.6  --   --   --   --   --   --   HGB 12.7  < > 12.2 11.3* 10.8* 11.6* 11.4*  HCT 42.3  < > 42.2 37.3 35.0* 37.5 35.3*  MCV 107.4*  < > 109.9* 106.3* 102.6* 102.5* 99.4  PLT 122*  < > 95* 98* 108* 111* 127*  < > = values in this interval not displayed. Cardiac Enzymes:  Recent Labs Lab 06/19/14 0204  TROPONINI <0.30   BNP: BNP (last 3 results) No results found for this basename: PROBNP,  in the last 8760 hours CBG: No results found for this basename: GLUCAP,  in the last 168 hours   SIGNED: Time coordinating discharge: Over 30 minutes  [name], MD  [facility] 06/25/2014, 9:42 AM Pager [phone]  If 7PM-7AM, please contact night-coverage [email or web address] Password [email or web address]

## 2014-06-25 NOTE — Progress Notes (Signed)
Assumed care of patient at 1500. Per previous RN, patient ready for discharge and is waiting on her ride, all instructions and prescriptions reviewed with patient by previous RN. Patient sitting up in chair still waiting for her niece to pick her up at this time. Agree with previous RN's assessment of patient. Will continue to monitor.

## 2014-06-28 ENCOUNTER — Ambulatory Visit: Payer: Medicare Other | Admitting: Internal Medicine

## 2014-06-28 ENCOUNTER — Telehealth: Payer: Self-pay | Admitting: Internal Medicine

## 2014-06-28 MED ORDER — FUROSEMIDE 40 MG PO TABS: 40.0000 mg | ORAL_TABLET | Freq: Every day | ORAL | Status: DC

## 2014-06-28 NOTE — Telephone Encounter (Signed)
Called the patient and informed lasix sent in as requested.  The patient is scheduled for a followup with PCP on 07/06/14.

## 2014-06-28 NOTE — Telephone Encounter (Signed)
Is requesting refill on lasix

## 2014-06-29 NOTE — Telephone Encounter (Signed)
Was dc'd on 9/6 Pl make appt with TP n 1 wk

## 2014-06-29 NOTE — Telephone Encounter (Signed)
Called pt. appt scheduled for 07/10/14 at 3:30 to see TP.

## 2014-07-06 ENCOUNTER — Ambulatory Visit: Payer: Medicare Other | Admitting: Internal Medicine

## 2014-07-06 DIAGNOSIS — Z0289 Encounter for other administrative examinations: Secondary | ICD-10-CM

## 2014-07-10 ENCOUNTER — Ambulatory Visit (INDEPENDENT_AMBULATORY_CARE_PROVIDER_SITE_OTHER): Payer: Commercial Managed Care - HMO | Admitting: Adult Health

## 2014-07-10 ENCOUNTER — Encounter: Payer: Self-pay | Admitting: Adult Health

## 2014-07-10 VITALS — BP 96/74 | HR 82 | Temp 98.5°F | Ht 61.0 in | Wt 91.0 lb

## 2014-07-10 DIAGNOSIS — J449 Chronic obstructive pulmonary disease, unspecified: Secondary | ICD-10-CM

## 2014-07-10 DIAGNOSIS — Z23 Encounter for immunization: Secondary | ICD-10-CM

## 2014-07-10 NOTE — Patient Instructions (Signed)
Continue on Spiriva 1 puff daily  Continue on Dulera 2 puffs Twice daily  Rinse after use.  Must stop all drug use.  Stop smoking . Absolutely no smoking in home or while on Oxygen.  Flu shot today .  Follow up Dr. Elsworth Soho  In 4-6 weeks and  As needed   Please contact office for sooner follow up if symptoms do not improve or worsen or seek emergency care

## 2014-07-12 ENCOUNTER — Telehealth: Payer: Self-pay | Admitting: *Deleted

## 2014-07-12 NOTE — Telephone Encounter (Signed)
Left msg on triage stating pt is requesting to have her albuterol sol delivered through [place]. She is new to the company. Needing vertification on script. Called [facility] spoke with John/pharmacist gave verbal ok on albuterol solution.Marland KitchenJohny Chess

## 2014-07-12 NOTE — Progress Notes (Signed)
Subjective:    Patient ID: Jasmine [name], female    DOB: 1946/03/04, 68 y.o.   MRN: [id number]  HPI   68 y.o smoker for FU of COPD .Seen for initial pulmonary eval 02/03/13   02/03/13 IOV  Pt states she has had asthma since she was a child.  C/o SOB w/ exertion, occasional cough & wheeze when she gets around dust or pollen, wheezing and chest tx w/ activity as well.  She reports pedal edema x 1 month & has been started on lasix  She was started on O2 in march 2014 & uses 2-3 liters 02 PRN and w/ sleep.  She has smoked Since her 33's - about 71 Pyrs. She admits to using cocaine recreationally along with her husband (who is an amputee!) & other family members.  CT chest 10/2011 >> showed Moderate centrilobular emphysema. Small bilateral pleural effusions with dependent atelectasis  01/12/13 EKG - shows P pulmonale  02/03/13 CXR - hyperinflation  02/03/13 BMET - shows bicarb of 32   PFT  >FEV1 at 0.47 L( 30%), ratio 39 , No BD response (<200cc)    05/02/13 Follow up  Pt states her breathing is better. Leg swelling is better but left ankle still swelling. she coughs after using nebulizer medication. she has more coughing, wheezing, chest tx when she is outside.   pt reports did very well with the spiriva Pt unclear on meds . She says she is taknig pulmicort neb Twice daily  Was also taking advair but stopped when she started Spiriva.  Spiriva helps - less dyspnea Was supplied with portable O2 - OCD - Not ready to quit smoking but is cutting back. 4-6 cigs/d  Stopped advair & is not taking budesonide either  08/12/13  Follow up COPD  Returns for 3 month follow up COPD. Began Pulm Rehab 1 month ago.  would like flu shot today.  CAT = 16 Continues to smoke, advised on smoking cessation Continue to use cocaine "I have cut back" -advised on cessation.  No ER or hospital admits.  No chest pain , orthopnea , edema or hemotpysis.   07/10/14 [facility] follow up -COPD  Pt returns for a post  hospital follow up .  Patient was admitted September 1 -6 for COPD exacerbation, and acute on chronic hypercarbic respiratory failure. Initially treated with BiPAP support. He was treated with antibiotics, steroid taper, and nebulized bronchodilators. He did have some lower extremity swelling and underwent a venous Doppler was negative for DVT. Patient did have some increased lethargy, and mental status changes. That was felt secondary to hypercarbia. This improved with BiPAP. Patient was counseled on smoking cessation, and decrease alcohol and cocaine use. We discussed dangers of smoking and oxygen use  Since discharge she reports breathing is slowly improving, still not at baseline.   Discussed importance of keeping follow up appointments .    Review of Systems  neg for any significant sore throat, dysphagia, itching, sneezing, nasal congestion or excess/ purulent secretions, fever, chills, sweats, unintended wt loss, pleuritic or exertional cp, hempoptysis, orthopnea pnd or change in chronic leg swelling. Also denies presyncope, palpitations, heartburn, abdominal pain, nausea, vomiting, diarrhea or change in bowel or urinary habits, dysuria,hematuria, rash, arthralgias, visual complaints, headache, numbness weakness or ataxia.     Objective:   Physical Exam   Gen. Pleasant, thin woman, in no distress ENT - no lesions, no post nasal drip Neck: No JVD, no thyromegaly, no carotid bruits Lungs: no use of accessory  muscles, no dullness to percussion, decreased without rales or rhonchi  Cardiovascular: Rhythm regular, heart sounds  normal, no murmurs or gallops, no peripheral edema Musculoskeletal: No deformities, no cyanosis or clubbing         Assessment & Plan:

## 2014-07-12 NOTE — Assessment & Plan Note (Signed)
Exacerbation with associated hypercarbic /hypoxic RF in setting on ongoing smoking and continued etoh/cocaine use.  Cessation counseling done .   Plan  Continue on Spiriva 1 puff daily  Continue on Dulera 2 puffs Twice daily  Rinse after use.  Must stop all drug use.  Stop smoking . Absolutely no smoking in home or while on Oxygen.  Flu shot today .  Follow up Dr. Elsworth Soho  In 4-6 weeks and  As needed   Please contact office for sooner follow up if symptoms do not improve or worsen or seek emergency care

## 2014-07-13 NOTE — Progress Notes (Signed)
Reviewed & agree with plan  

## 2014-07-21 ENCOUNTER — Telehealth: Payer: Self-pay | Admitting: Pulmonary Disease

## 2014-07-21 NOTE — Telephone Encounter (Signed)
i spoke with pt and she stated that she has been trying to get a portable tank delivered from [place] for the last 13 days but has not gotten anywhere with this.  Pt requested that we call and speak with [employer] to see if this could be handled by [place].    I called [employer] and they stated that at this time the pt does not have any equipment with them since she is trying to transition over to them and they are trying to get everything processed through her insurance company.  They stated that the pt will have to call [employer] and see if they can push this through.    i have called and lmomtcb x 1 for the pt.

## 2014-07-24 ENCOUNTER — Telehealth: Payer: Self-pay | Admitting: Pulmonary Disease

## 2014-07-24 NOTE — Telephone Encounter (Signed)
Called pt. She reports [employer] is telling her they need authorization from [place] before they can supply her O2 and also a "silverback referral". I advised pt will call [employer] and see what is going on.  Called [employer] and received answering service. WCB

## 2014-07-24 NOTE — Telephone Encounter (Signed)
Called and spoke with pt and she is aware and she will call [employer] to see if she can get this pushed through.  Pt stated that she will also call [employer] to see if this can be processed.

## 2014-07-25 NOTE — Telephone Encounter (Signed)
Called [name] and was transferred to [facility] Transition at [phone] and was disconnected. Called [employer] Transition back and was on hold for 12 min, then answering machine took over stating their office was now closed and to call back. Total time of calls-80min. WCB.

## 2014-07-26 NOTE — Telephone Encounter (Signed)
Called [employer] and was on hold for over 15 min. WCB.

## 2014-07-27 NOTE — Telephone Encounter (Signed)
This is a [facility] issue, will forward to [facility] to handle, thanks

## 2014-07-27 NOTE — Telephone Encounter (Signed)
Spoke to helena @ [employer] pt was set up on 07/24/14 Kathleen Lime Ottinger lmtcb with pt's # Joellen [place]

## 2014-07-28 ENCOUNTER — Telehealth: Payer: Self-pay | Admitting: Pulmonary Disease

## 2014-07-28 ENCOUNTER — Other Ambulatory Visit: Payer: Self-pay | Admitting: *Deleted

## 2014-07-28 MED ORDER — CETIRIZINE HCL 10 MG PO TABS: 10.0000 mg | ORAL_TABLET | Freq: Every day | ORAL | Status: DC

## 2014-07-28 NOTE — Telephone Encounter (Signed)
mindy has placed these forms in RA look at.  Please advise. thanks

## 2014-07-28 NOTE — Telephone Encounter (Signed)
lmomtcb Jasmine [name] ° °

## 2014-07-28 NOTE — Telephone Encounter (Signed)
Spoke to pt she has her 87 from [place] and they are working on getting her something smaller to use [employer]

## 2014-08-02 NOTE — Telephone Encounter (Signed)
Will ask TP to fill form - about housebound status - since she saw her last - I have not seen this pt in 1 yr

## 2014-08-03 NOTE — Telephone Encounter (Signed)
Will forward to [place] to look through her papers to see if these have been dropped off.

## 2014-08-03 NOTE — Telephone Encounter (Signed)
?   Papers , please give them to [facility]

## 2014-08-08 NOTE — Telephone Encounter (Signed)
I have never received any form on this patient and neither Mindy or I can locate forms now Called spoke with patient and apologized for misplacing these and to see what can be done to obtain these again Pt stated the form was from the [place]: [phone]  F: [phone]  LMOM TCB x1 for Seth Bake to see if another form can be faxed to my attn.

## 2014-08-09 NOTE — Telephone Encounter (Signed)
Seth Bake returned call.  Form has already been faxed, but have requested this be faxed again to triage and I was unable to locate the form. [id number] form to be faxed to my attention Per Seth Bake, no call back is needed once form is completed Will hold in my inbox

## 2014-08-09 NOTE — Telephone Encounter (Signed)
lmtcb for [employer]

## 2014-08-10 NOTE — Telephone Encounter (Signed)
Forms received and placed in TP's lookat Will send to TP as FYI

## 2014-08-10 NOTE — Telephone Encounter (Signed)
Spoke with pt and she is also requesting a handicap placard form .  She states she dropped this off with other papers.  I informed her that we have these forms in our office and I will ask TP about signing this form also.

## 2014-08-10 NOTE — Telephone Encounter (Signed)
Dr. Elsworth Soho has the forms in his possession. Thanks

## 2014-08-14 NOTE — Telephone Encounter (Signed)
Handicap placard signed by TP Per TP: as for the other paperwork - will need to have PCP complete these forms as our office does not perform the type of exam required to fill this out.  Thanks.

## 2014-08-16 ENCOUNTER — Other Ambulatory Visit: Payer: Self-pay | Admitting: Pulmonary Disease

## 2014-08-17 NOTE — Telephone Encounter (Signed)
Called and spoke with pt and she is aware that the handicap placard has been signed by TP and she will call and talk with [name] about having the other forms completed.  Jasmine [name] will mail this form to the pt.  Nothing further is needed.

## 2014-08-17 NOTE — Telephone Encounter (Signed)
lmomtcb x 1 for the pt 

## 2014-08-17 NOTE — Telephone Encounter (Signed)
Handicap placard signed and placed in the mail to home address Will sign off

## 2014-08-17 NOTE — Telephone Encounter (Signed)
Pt returned call - [phone]

## 2014-08-21 ENCOUNTER — Other Ambulatory Visit: Payer: Self-pay | Admitting: *Deleted

## 2014-08-21 MED ORDER — TIOTROPIUM BROMIDE MONOHYDRATE 18 MCG IN CAPS: ORAL_CAPSULE | RESPIRATORY_TRACT | Status: DC

## 2014-08-22 ENCOUNTER — Other Ambulatory Visit: Payer: Self-pay | Admitting: Internal Medicine

## 2014-08-23 ENCOUNTER — Other Ambulatory Visit: Payer: Self-pay | Admitting: Internal Medicine

## 2014-08-26 ENCOUNTER — Other Ambulatory Visit: Payer: Self-pay | Admitting: Internal Medicine

## 2014-08-30 ENCOUNTER — Encounter: Payer: Self-pay | Admitting: Pulmonary Disease

## 2014-08-30 ENCOUNTER — Telehealth: Payer: Self-pay | Admitting: Internal Medicine

## 2014-08-30 ENCOUNTER — Ambulatory Visit (INDEPENDENT_AMBULATORY_CARE_PROVIDER_SITE_OTHER): Payer: Commercial Managed Care - HMO | Admitting: Pulmonary Disease

## 2014-08-30 ENCOUNTER — Ambulatory Visit: Payer: Commercial Managed Care - HMO | Admitting: Pulmonary Disease

## 2014-08-30 VITALS — BP 92/62 | HR 87 | Temp 97.9°F | Ht 60.0 in | Wt 88.4 lb

## 2014-08-30 DIAGNOSIS — Z720 Tobacco use: Secondary | ICD-10-CM | POA: Diagnosis not present

## 2014-08-30 DIAGNOSIS — J328 Other chronic sinusitis: Secondary | ICD-10-CM

## 2014-08-30 DIAGNOSIS — J449 Chronic obstructive pulmonary disease, unspecified: Secondary | ICD-10-CM | POA: Diagnosis not present

## 2014-08-30 DIAGNOSIS — F17200 Nicotine dependence, unspecified, uncomplicated: Secondary | ICD-10-CM

## 2014-08-30 MED ORDER — PANTOPRAZOLE SODIUM 40 MG PO TBEC: 40.0000 mg | DELAYED_RELEASE_TABLET | Freq: Two times a day (BID) | ORAL | Status: DC

## 2014-08-30 NOTE — Progress Notes (Signed)
   Subjective:    Patient ID: Jasmine [name], female    DOB: 12-Feb-1946, 68 y.o.   MRN: [id number]  HPI   68 y.o smoker for FU of COPD .Seen for initial pulmonary eval 02/03/13  She was started on O2 in march 2014 & uses 2-3 liters 02 PRN and w/ sleep.  She has smoked Since her 64's - about 26 Pyrs. She admits to using cocaine recreationally along with her husband (who is an amputee!) & other family members.   Significant tests/ events  CT chest 10/2011 >> showed Moderate centrilobular emphysema. Small bilateral pleural effusions with dependent atelectasis  01/12/13 EKG -  P pulmonale    02/03/13 BMET - shows bicarb of 32  PFT >FEV1 at 0.47 L( 30%), ratio 39 , No BD response (<200cc)  2014 completed Pulm Rehab   08/30/2014  Chief Complaint  Patient presents with  . Follow-up    F/U COPD; no complaints    Admitted September 2016 for COPD exacerbation, and acute on chronic hypercarbic respiratory failure. Initially treated with BiPAP. She was treated with antibiotics, steroid taper, and nebulized bronchodilators. venous Doppler was negative for DVT. Back to smoking 1-2 cigs/d - husband smokes too Compliant with o2 now No wheezing or pedal edema C/o ear wax & poor hearing   Review of Systems neg for any significant sore throat, dysphagia, itching, sneezing, nasal congestion or excess/ purulent secretions, fever, chills, sweats, unintended wt loss, pleuritic or exertional cp, hempoptysis, orthopnea pnd or change in chronic leg swelling. Also denies presyncope, palpitations, heartburn, abdominal pain, nausea, vomiting, diarrhea or change in bowel or urinary habits, dysuria,hematuria, rash, arthralgias, visual complaints, headache, numbness weakness or ataxia.     Objective:   Physical Exam  Gen. Pleasant, thin woman, in no distress on O2 ENT - no lesions, no post nasal drip Neck: No JVD, no thyromegaly, no carotid bruits Lungs: no use of accessory muscles, no dullness to  percussion, decreased without rales or rhonchi  Cardiovascular: Rhythm regular, heart sounds  normal, no murmurs or gallops, no peripheral edema Musculoskeletal: No deformities, no cyanosis or clubbing         Assessment & Plan:

## 2014-08-30 NOTE — Assessment & Plan Note (Signed)
Ct dulera/spiriva Ct o2 for chronic resp failure Initiated discussion about EOL planning

## 2014-08-30 NOTE — Assessment & Plan Note (Signed)
Nicotine patch Smoking cessation

## 2014-08-30 NOTE — Telephone Encounter (Signed)
Referral done

## 2014-08-30 NOTE — Patient Instructions (Addendum)
Stay on [place] have to quit smoking - Ok to use nicotine patches Contact PCP for ear wax

## 2014-08-30 NOTE — Telephone Encounter (Signed)
Pt came by office requesting to get referral submitted to [place] and Throat physician. Please contact pt and advise when request is reviewed.

## 2014-09-01 ENCOUNTER — Other Ambulatory Visit: Payer: Self-pay | Admitting: Internal Medicine

## 2014-09-01 ENCOUNTER — Telehealth: Payer: Self-pay | Admitting: Pulmonary Disease

## 2014-09-01 MED ORDER — MOMETASONE FURO-FORMOTEROL FUM 200-5 MCG/ACT IN AERO: 2.0000 | INHALATION_SPRAY | Freq: Two times a day (BID) | RESPIRATORY_TRACT | Status: DC

## 2014-09-01 NOTE — Telephone Encounter (Signed)
rx was sent  Pt aware  Nothing further needed

## 2014-09-05 ENCOUNTER — Encounter: Payer: Commercial Managed Care - HMO | Admitting: Internal Medicine

## 2014-09-07 ENCOUNTER — Telehealth: Payer: Self-pay | Admitting: Internal Medicine

## 2014-09-07 ENCOUNTER — Other Ambulatory Visit: Payer: Self-pay | Admitting: Internal Medicine

## 2014-09-07 MED ORDER — POTASSIUM CHLORIDE ER 10 MEQ PO TBCR: 10.0000 meq | EXTENDED_RELEASE_TABLET | Freq: Two times a day (BID) | ORAL | Status: DC

## 2014-09-07 NOTE — Telephone Encounter (Signed)
Pt had to cancel appt 11/17 with Dr Jenny Reichmann due to furnace break down and no heat. Pt did reschedule appt for February to see Dr Jenny Reichmann. Pt is completely out of [place]. Pt needs this to take with her other medicine to avoid leg cramps. Can pt get enough until February.

## 2014-09-07 NOTE — Telephone Encounter (Signed)
Called the patient informed family refill done and to keep February appointment to continue getting refills.

## 2014-09-26 ENCOUNTER — Encounter: Payer: Self-pay | Admitting: Endocrinology

## 2014-09-26 ENCOUNTER — Ambulatory Visit (INDEPENDENT_AMBULATORY_CARE_PROVIDER_SITE_OTHER): Payer: Commercial Managed Care - HMO | Admitting: Endocrinology

## 2014-09-26 VITALS — BP 126/84 | HR 82 | Temp 98.1°F | Ht 60.0 in | Wt 89.0 lb

## 2014-09-26 DIAGNOSIS — E890 Postprocedural hypothyroidism: Secondary | ICD-10-CM

## 2014-09-26 LAB — TSH: TSH: 2.06 u[IU]/mL (ref 0.35–4.50)

## 2014-09-26 NOTE — Progress Notes (Signed)
Subjective:    Patient ID: Jasmine [name], female    DOB: [birth year], 68 y.o.   MRN: [id number]  HPI I last saw this pt in 2012.  she had I-131 rx for hyperthyroidism, due to [place], in 2006. she has been on prescribed thyroid hormone therapy since then.  she has never taken kelp or any other type of non-prescribed thyroid product.  she has never had thyroid surgery, or XRT to the neck.  He has never been on amiodarone or lithium.  Pt says she seldom misses the synthroid.  She has slight dyspnea sensation in the chest, not no assoc palpitations. Past Medical History  Diagnosis Date  . ALLERGIC RHINITIS 05/01/2007  . ASTHMA 05/01/2007  . BACK PAIN 10/10/2008  . DEPRESSION 07/21/2008  . FATIGUE 10/10/2010  . HYPERTENSION 05/01/2007  . HYPERTHYROIDISM 11/23/2007    Pt endorses having had Graves disease, possibly radioactive iodine x1, but no thyroidectomy  . HYPOTHYROIDISM 08/23/2009  . LUMBAR RADICULOPATHY, RIGHT 08/25/2008  . OSTEOPOROSIS 05/01/2007  . SHOULDER PAIN, LEFT 08/23/2009  . SINUSITIS, CHRONIC 10/10/2010  . SINUSITIS- ACUTE-NOS 10/10/2008  . H/O blood clots     left leg 1960's    Past Surgical History  Procedure Laterality Date  . Appendectomy    . Ectopic pregnancy surgery    . Post fallopian tube removal right side    . History of multiple dialations and curettages and miscarriages, unfortunately never carrying a child to term    . Electrocardiogram  06/20/2006  . Tubal ligation      History   Social History  . Marital Status: Married    Spouse Name: N/A    Number of Children: N/A  . Years of Education: N/A   Occupational History  . RETIRED    Social History Main Topics  . Smoking status: Current Every Day Smoker -- 0.05 packs/day for 40 years    Types: Cigarettes  . Smokeless tobacco: Not on file  . Alcohol Use: 1.2 oz/week    2 Cans of beer per week     Comment: 2 beers a day, occasional liquor, ? minimizing  . Drug Use: 2.00 per week    Special:  Cocaine     Comment: Crack cocaine-sometimes weekends and holidays  . Sexual Activity: Not on file   Other Topics Concern  . Not on file   Social History Narrative   Lives in [place], [place]. Lives with husband, no children, but husband has 3 kids. Ambulatory, without cane or walker. Smokes cigarettes, currently 1 ppd, smoked for >20-30 yrs. Drinks beer, ~2/day, occasionally liquor, but ? suspect minimizing. Occasional MJ and endorses crack cocaine occasionally    Current Outpatient Prescriptions on File Prior to Visit  Medication Sig Dispense Refill  . albuterol (PROVENTIL HFA;VENTOLIN HFA) 108 (90 BASE) MCG/ACT inhaler Inhale 2 puffs into the lungs every 6 (six) hours as needed for wheezing or shortness of breath.    [name] albuterol (PROVENTIL) (2.5 MG/3ML) 0.083% nebulizer solution Take 3 mLs (2.5 mg total) by nebulization every 4 (four) hours as needed for wheezing. 75 mL 12  . cetirizine (ZYRTEC) 10 MG tablet Take 1 tablet (10 mg total) by mouth daily. 30 tablet 3  . citalopram (CELEXA) 10 MG tablet Take 10 mg by mouth daily.    . furosemide (LASIX) 40 MG tablet Take 1 tablet (40 mg total) by mouth daily. 30 tablet 3  . HYDROcodone-acetaminophen (NORCO/VICODIN) 5-325 MG per tablet Take 1-2 tablets by mouth every 4 (  four) hours as needed for moderate pain. 65 tablet 0  . levothyroxine (SYNTHROID, LEVOTHROID) 50 MCG tablet Take 50 mcg by mouth daily before breakfast.    . mometasone-formoterol (DULERA) 200-5 MCG/ACT AERO Inhale 2 puffs into the lungs 2 (two) times daily. 1 Inhaler 3  . naproxen sodium (ANAPROX) 220 MG tablet Take 220 mg by mouth 2 (two) times daily as needed (pain).     . potassium chloride (K-DUR) 10 MEQ tablet Take 1 tablet (10 mEq total) by mouth 2 (two) times daily. 60 tablet 2  . tiotropium (SPIRIVA HANDIHALER) 18 MCG inhalation capsule PLACE 1 INTO INHALER AND INHALE DAILY 30 capsule 0  . triamcinolone cream (KENALOG) 0.1 % Apply 1 application topically 2 (two) times  daily as needed ("break out on arms").     No current facility-administered medications on file prior to visit.    Allergies  Allergen Reactions  . Alendronate Sodium Other (See Comments)    Family History  Problem Relation Age of Onset  . Alcohol abuse Brother   . Diabetes Brother   . Hypertension Other   . Lung cancer Father   . Stroke Mother   . Asthma      maternal aunts  . Thyroid disease Mother     BP 126/84 mmHg  Pulse 82  Temp(Src) 98.1 F (36.7 C) (Oral)  Ht 5' (1.524 m)  Wt 89 lb (40.37 kg)  BMI 17.38 kg/m2  SpO2 96%  Review of Systems denies hair loss, cough, weight gain, memory loss, numbness, blurry vision, cold intolerance, easy bruising, and syncope. She has leg cramps, dry skin, rhinorrhea, and constipation.  No change in chronic mild depression.      Objective:   Physical Exam VS: see vs page GEN: no distress.  Has 02 on HEAD: head: no deformity eyes: no periorbital swelling, no proptosis external nose and ears are normal mouth: no lesion seen NECK: supple, thyroid is not enlarged CHEST WALL: no deformity LUNGS:  Clear to auscultation CV: reg rate and rhythm, no murmur ABD: abdomen is soft, nontender.  no hepatosplenomegaly.  not distended.  no hernia MUSCULOSKELETAL: muscle bulk and strength are grossly normal.  no obvious joint swelling.  gait is normal and steady EXTEMITIES: no deformity.  no ulcer on the feet.  feet are of normal color and temp.  no edema PULSES: dorsalis pedis intact bilat.  no carotid bruit NEURO:  cn 2-12 grossly intact.   readily moves all 4's.  sensation is intact to touch on the feet SKIN:  Normal texture and temperature.  No rash or suspicious lesion is visible.   NODES:  None palpable at the neck PSYCH: alert, well-oriented.  Does not appear anxious nor depressed.   Lab Results  Component Value Date   TSH 2.06 09/26/2014   i reviewed electrocardiogram from 06/20/14.  i have reviewed the following old  records: D/c summary from [place], 2015.  Pt was in hosp for CO2 narcosis.     Assessment & Plan:  Post-I-131 hypothyroidism.  Euthyroid on replacement.    Patient is advised the following: Patient Instructions  blood tests are being requested for you today.  We'll let you know about the results. Please return in 1 year. You require less than a full daily amount of thyroid hormone.  This means that the thyroid was not completely destroyed by the radioactive iodine.  Therefore, the amount you need will likely go up or down, so we should follow it.

## 2014-09-26 NOTE — Patient Instructions (Signed)
blood tests are being requested for you today.  We'll let you know about the results. Please return in 1 year. You require less than a full daily amount of thyroid hormone.  This means that the thyroid was not completely destroyed by the radioactive iodine.  Therefore, the amount you need will likely go up or down, so we should follow it.

## 2014-10-17 ENCOUNTER — Other Ambulatory Visit: Payer: Self-pay | Admitting: Internal Medicine

## 2014-10-25 DIAGNOSIS — J449 Chronic obstructive pulmonary disease, unspecified: Secondary | ICD-10-CM | POA: Diagnosis not present

## 2014-10-31 ENCOUNTER — Other Ambulatory Visit: Payer: Self-pay | Admitting: Internal Medicine

## 2014-11-13 ENCOUNTER — Ambulatory Visit: Payer: BC Managed Care – PPO | Admitting: Audiology

## 2014-11-21 ENCOUNTER — Telehealth: Payer: Self-pay | Admitting: Internal Medicine

## 2014-11-21 MED ORDER — VARENICLINE TARTRATE 0.5 MG X 11 & 1 MG X 42 PO MISC: ORAL | Status: DC

## 2014-11-21 MED ORDER — VARENICLINE TARTRATE 1 MG PO TABS: 1.0000 mg | ORAL_TABLET | Freq: Two times a day (BID) | ORAL | Status: DC

## 2014-11-21 NOTE — Telephone Encounter (Signed)
Patient is requesting script [facility] [address]

## 2014-11-21 NOTE — Telephone Encounter (Signed)
Done erx 

## 2014-11-22 ENCOUNTER — Ambulatory Visit: Payer: Commercial Managed Care - HMO | Admitting: Audiology

## 2014-11-22 NOTE — Telephone Encounter (Signed)
Faxed hardcopy to [facility].

## 2014-11-25 DIAGNOSIS — J449 Chronic obstructive pulmonary disease, unspecified: Secondary | ICD-10-CM | POA: Diagnosis not present

## 2014-11-26 ENCOUNTER — Other Ambulatory Visit: Payer: Self-pay | Admitting: Internal Medicine

## 2014-11-30 ENCOUNTER — Ambulatory Visit (INDEPENDENT_AMBULATORY_CARE_PROVIDER_SITE_OTHER): Payer: Commercial Managed Care - HMO | Admitting: Adult Health

## 2014-11-30 ENCOUNTER — Encounter: Payer: Self-pay | Admitting: Adult Health

## 2014-11-30 VITALS — BP 116/62 | HR 72 | Temp 97.5°F | Ht 60.0 in | Wt 93.0 lb

## 2014-11-30 DIAGNOSIS — J449 Chronic obstructive pulmonary disease, unspecified: Secondary | ICD-10-CM | POA: Diagnosis not present

## 2014-11-30 DIAGNOSIS — J9611 Chronic respiratory failure with hypoxia: Secondary | ICD-10-CM | POA: Diagnosis not present

## 2014-11-30 DIAGNOSIS — Z23 Encounter for immunization: Secondary | ICD-10-CM

## 2014-11-30 DIAGNOSIS — J9622 Acute and chronic respiratory failure with hypercapnia: Secondary | ICD-10-CM | POA: Insufficient documentation

## 2014-11-30 NOTE — Assessment & Plan Note (Signed)
Compensated on present regimen smoking cessation  Plan Continue on Spiriva 1 puff daily  Continue on Dulera 2 puffs Twice daily  Rinse after use.  Stop smoking . Absolutely no smoking in home or while on Oxygen.  Prevnar Vaccine today  today .  Follow up Dr. Elsworth Soho  In 3 months nd  As needed

## 2014-11-30 NOTE — Assessment & Plan Note (Signed)
Continue on home O2 at 2 L

## 2014-11-30 NOTE — Addendum Note (Signed)
Addended by: Len Blalock on: 11/30/2014 03:12 PM   Modules accepted: Orders

## 2014-11-30 NOTE — Patient Instructions (Addendum)
Continue on Spiriva 1 puff daily  Continue on Dulera 2 puffs Twice daily  Rinse after use.  Stop smoking . Absolutely no smoking in home or while on Oxygen.  Prevnar Vaccine today  today .  Follow up Dr. Elsworth Soho  In 3 months nd  As needed

## 2014-11-30 NOTE — Progress Notes (Signed)
  Subjective:    Patient ID: Jasmine [name], female    DOB: Mar 03, 1946, 69 y.o.   MRN: [id number]  HPI 69 y.o smoker for FU of COPD .Seen for initial pulmonary eval 02/03/13  She was started on O2 in march 2014 & uses 2-3 liters 02 PRN and w/ sleep.  She has smoked Since her 59's - about 49 Pyrs. She admits to using cocaine recreationally along with her husband (who is an amputee!) & other family members.  CT chest 10/2011 >> showed Moderate centrilobular emphysema. Small bilateral pleural effusions with dependent atelectasis  01/12/13 EKG - shows P pulmonale  02/03/13 CXR - hyperinflation  02/03/13 BMET - shows bicarb of 32   PFT  >FEV1 at 0.47 L( 30%), ratio 39 , No BD response (<200cc)   11/30/2014 Follow up : COPD /Chronic Hypoxic RF on O2 Patient returns for three-month follow-up for COPD She remains on Dulera and Spiriva. She is on home oxygen at 2 L. Unfortunately, patient continues to smoke. We discussed smoking cessation. She says she has very rare use of cocaine., Discussed cessation. She denies any chest pain, orthopnea, PND or leg swelling.       Review of Systems  neg for any significant sore throat, dysphagia, itching, sneezing, nasal congestion or excess/ purulent secretions, fever, chills, sweats, unintended wt loss, pleuritic or exertional cp, hempoptysis, orthopnea pnd or change in chronic leg swelling. Also denies presyncope, palpitations, heartburn, abdominal pain, nausea, vomiting, diarrhea or change in bowel or urinary habits, dysuria,hematuria, rash, arthralgias, visual complaints, headache, numbness weakness or ataxia.     Objective:   Physical Exam   Gen. Pleasant, thin woman, in no distress ENT - no lesions, no post nasal drip Neck: No JVD, no thyromegaly, no carotid bruits Lungs: no use of accessory muscles, no dullness to percussion, decreased without rales or rhonchi  Cardiovascular: Rhythm regular, heart sounds  normal, no murmurs or gallops, no  peripheral edema Musculoskeletal: No deformities, no cyanosis or clubbing         Assessment & Plan:

## 2014-12-06 ENCOUNTER — Encounter: Payer: Commercial Managed Care - HMO | Admitting: Internal Medicine

## 2014-12-08 ENCOUNTER — Other Ambulatory Visit: Payer: Self-pay | Admitting: Pulmonary Disease

## 2014-12-13 ENCOUNTER — Ambulatory Visit: Payer: Commercial Managed Care - HMO | Attending: Audiology | Admitting: Audiology

## 2014-12-18 ENCOUNTER — Other Ambulatory Visit: Payer: Self-pay | Admitting: Pulmonary Disease

## 2014-12-24 DIAGNOSIS — J449 Chronic obstructive pulmonary disease, unspecified: Secondary | ICD-10-CM | POA: Diagnosis not present

## 2015-01-09 ENCOUNTER — Other Ambulatory Visit: Payer: Self-pay | Admitting: Internal Medicine

## 2015-01-09 ENCOUNTER — Telehealth: Payer: Self-pay | Admitting: Internal Medicine

## 2015-01-11 ENCOUNTER — Encounter: Payer: Self-pay | Admitting: Internal Medicine

## 2015-01-11 ENCOUNTER — Ambulatory Visit (INDEPENDENT_AMBULATORY_CARE_PROVIDER_SITE_OTHER): Payer: Commercial Managed Care - HMO | Admitting: Internal Medicine

## 2015-01-11 ENCOUNTER — Other Ambulatory Visit (INDEPENDENT_AMBULATORY_CARE_PROVIDER_SITE_OTHER): Payer: Commercial Managed Care - HMO

## 2015-01-11 VITALS — BP 118/60 | HR 96 | Temp 99.3°F | Resp 18 | Ht 60.0 in | Wt 90.4 lb

## 2015-01-11 DIAGNOSIS — Z0000 Encounter for general adult medical examination without abnormal findings: Secondary | ICD-10-CM

## 2015-01-11 LAB — CBC WITH DIFFERENTIAL/PLATELET
BASOS ABS: 0 10*3/uL (ref 0.0–0.1)
Basophils Relative: 0.2 % (ref 0.0–3.0)
EOS ABS: 0.2 10*3/uL (ref 0.0–0.7)
EOS PCT: 2.9 % (ref 0.0–5.0)
HCT: 44.1 % (ref 36.0–46.0)
Hemoglobin: 14.5 g/dL (ref 12.0–15.0)
LYMPHS PCT: 16.4 % (ref 12.0–46.0)
Lymphs Abs: 1.2 10*3/uL (ref 0.7–4.0)
MCHC: 32.9 g/dL (ref 30.0–36.0)
MCV: 97.6 fl (ref 78.0–100.0)
MONO ABS: 0.6 10*3/uL (ref 0.1–1.0)
Monocytes Relative: 9.1 % (ref 3.0–12.0)
NEUTROS ABS: 5.1 10*3/uL (ref 1.4–7.7)
NEUTROS PCT: 71.4 % (ref 43.0–77.0)
PLATELETS: 173 10*3/uL (ref 150.0–400.0)
RBC: 4.52 Mil/uL (ref 3.87–5.11)
RDW: 14.5 % (ref 11.5–15.5)
WBC: 7.1 10*3/uL (ref 4.0–10.5)

## 2015-01-11 LAB — URINALYSIS, ROUTINE W REFLEX MICROSCOPIC
BILIRUBIN URINE: NEGATIVE
Hgb urine dipstick: NEGATIVE
KETONES UR: NEGATIVE
NITRITE: NEGATIVE
SPECIFIC GRAVITY, URINE: 1.01 (ref 1.000–1.030)
TOTAL PROTEIN, URINE-UPE24: 30 — AB
Urine Glucose: NEGATIVE
Urobilinogen, UA: 0.2 (ref 0.0–1.0)
pH: 8 (ref 5.0–8.0)

## 2015-01-11 MED ORDER — FUROSEMIDE 40 MG PO TABS: 40.0000 mg | ORAL_TABLET | Freq: Every day | ORAL | Status: DC

## 2015-01-11 MED ORDER — POTASSIUM CHLORIDE ER 10 MEQ PO TBCR: 10.0000 meq | EXTENDED_RELEASE_TABLET | Freq: Two times a day (BID) | ORAL | Status: DC

## 2015-01-11 NOTE — Progress Notes (Signed)
Subjective:    Patient ID: Jasmine [name], female    DOB: Dec 18, 1945, 69 y.o.   MRN: [id number]  HPI  Here for wellness and f/u;  Overall doing ok;  Pt denies Chest pain, worsening SOB, DOE, wheezing, orthopnea, PND, worsening LE edema, palpitations, dizziness or syncope, except can tell a difference in breathing with onset of 2 wks allergy season..  Pt denies neurological change such as new headache, facial or extremity weakness.  Pt denies polydipsia, polyuria, or low sugar symptoms. Pt states overall good compliance with treatment and medications, good tolerability, and has been trying to follow appropriate diet.  Pt denies worsening depressive symptoms, suicidal ideation or panic. No fever, night sweats, wt loss, loss of appetite, or other constitutional symptoms.  Pt states good ability with ADL's, has low fall risk, home safety reviewed and adequate, no other significant changes in hearing or vision, Denies hyper or hypo thyroid symptoms such as voice, skin or hair change. Home o2 at 3L at home, 4L when outside the home moving about. Taking the ensure for maint weight Wt Readings from Last 3 Encounters:  01/11/15 90 lb 6.4 oz (41.005 kg)  11/30/14 93 lb (42.185 kg)  09/26/14 89 lb (40.37 kg)    Past Medical History  Diagnosis Date  . ALLERGIC RHINITIS 05/01/2007  . ASTHMA 05/01/2007  . BACK PAIN 10/10/2008  . DEPRESSION 07/21/2008  . FATIGUE 10/10/2010  . HYPERTENSION 05/01/2007  . HYPERTHYROIDISM 11/23/2007    Pt endorses having had Graves disease, possibly radioactive iodine x1, but no thyroidectomy  . HYPOTHYROIDISM 08/23/2009  . LUMBAR RADICULOPATHY, RIGHT 08/25/2008  . OSTEOPOROSIS 05/01/2007  . SHOULDER PAIN, LEFT 08/23/2009  . SINUSITIS, CHRONIC 10/10/2010  . SINUSITIS- ACUTE-NOS 10/10/2008  . H/O blood clots     left leg 1960's   Past Surgical History  Procedure Laterality Date  . Appendectomy    . Ectopic pregnancy surgery    . Post fallopian tube removal right side    .  History of multiple dialations and curettages and miscarriages, unfortunately never carrying a child to term    . Electrocardiogram  06/20/2006  . Tubal ligation      reports that she has been smoking Cigarettes.  She has a 2 pack-year smoking history. She has never used smokeless tobacco. She reports that she drinks about 1.2 oz of alcohol per week. She reports that she uses illicit drugs (Cocaine) about twice per week. family history includes Alcohol abuse in her brother; Asthma in an other family member; Diabetes in her brother; Hypertension in her other; Lung cancer in her father; Stroke in her mother; Thyroid disease in her mother. Allergies  Allergen Reactions  . Alendronate Sodium Other (See Comments)   Current Outpatient Prescriptions on File Prior to Visit  Medication Sig Dispense Refill  . albuterol (PROVENTIL) (2.5 MG/3ML) 0.083% nebulizer solution Take 3 mLs (2.5 mg total) by nebulization every 4 (four) hours as needed for wheezing. 75 mL 12  . cetirizine (ZYRTEC) 10 MG tablet TAKE 1 TABLET (10 MG TOTAL) BY MOUTH DAILY. 30 tablet 3  . DULERA 200-5 MCG/ACT AERO INHALE 2 PUFFS INTO THE LUNGS 2 (TWO) TIMES DAILY. 13 Inhaler 3  . furosemide (LASIX) 40 MG tablet TAKE 1 TABLET BY MOUTH DAILY. 30 tablet 1  . levothyroxine (SYNTHROID, LEVOTHROID) 50 MCG tablet TAKE 1 TABLET EVERY DAY 30 tablet 2  . potassium chloride (K-DUR) 10 MEQ tablet Take 1 tablet (10 mEq total) by mouth 2 (two) times daily. [place]  tablet 2  . tiotropium (SPIRIVA HANDIHALER) 18 MCG inhalation capsule PLACE 1 INTO INHALER AND INHALE DAILY 30 capsule 0  . triamcinolone cream (KENALOG) 0.1 % Apply 1 application topically 2 (two) times daily as needed ("break out on arms").     No current facility-administered medications on file prior to visit.   Review of Systems Constitutional: Negative for increased diaphoresis, other activity, appetite or siginficant weight change other than noted HENT: Negative for worsening hearing  loss, ear pain, facial swelling, mouth sores and neck stiffness.   Eyes: Negative for other worsening pain, redness or visual disturbance.  Respiratory: Negative for shortness of breath and wheezing  Cardiovascular: Negative for chest pain and palpitations.  Gastrointestinal: Negative for diarrhea, blood in stool, abdominal distention or other pain Genitourinary: Negative for hematuria, flank pain or change in urine volume.  Musculoskeletal: Negative for myalgias or other joint complaints.  Skin: Negative for color change and wound or drainage.  Neurological: Negative for syncope and numbness. other than noted Hematological: Negative for adenopathy. or other swelling Psychiatric/Behavioral: Negative for hallucinations, SI, self-injury, decreased concentration or other worsening agitation.      Objective:   Physical Exam        Assessment & Plan:

## 2015-01-11 NOTE — Progress Notes (Signed)
Pre visit review using our clinic review tool, if applicable. No additional management support is needed unless otherwise documented below in the visit note. 

## 2015-01-11 NOTE — Assessment & Plan Note (Signed)

## 2015-01-11 NOTE — Patient Instructions (Signed)

## 2015-01-15 LAB — TSH: TSH: 2.64 u[IU]/mL (ref 0.35–4.50)

## 2015-01-16 ENCOUNTER — Encounter: Payer: Self-pay | Admitting: Internal Medicine

## 2015-01-16 ENCOUNTER — Other Ambulatory Visit: Payer: Commercial Managed Care - HMO

## 2015-01-16 LAB — HEPATIC FUNCTION PANEL
ALK PHOS: 62 U/L (ref 39–117)
ALT: 17 U/L (ref 0–35)
AST: 25 U/L (ref 0–37)
Albumin: 4.1 g/dL (ref 3.5–5.2)
BILIRUBIN DIRECT: 0.1 mg/dL (ref 0.0–0.3)
TOTAL PROTEIN: 7.3 g/dL (ref 6.0–8.3)
Total Bilirubin: 0.4 mg/dL (ref 0.2–1.2)

## 2015-01-16 LAB — LIPID PANEL
CHOLESTEROL: 184 mg/dL (ref 0–200)
HDL: 84.3 mg/dL (ref >39.00)
LDL CALC: 88 mg/dL (ref 0–99)
NONHDL: 99.7
Total CHOL/HDL Ratio: 2
Triglycerides: 60 mg/dL (ref 0.0–149.0)
VLDL: 12 mg/dL (ref 0.0–40.0)

## 2015-01-16 LAB — BASIC METABOLIC PANEL
BUN: 13 mg/dL (ref 6–23)
CALCIUM: 10.1 mg/dL (ref 8.4–10.5)
CO2: 38 meq/L — AB (ref 19–32)
CREATININE: 0.76 mg/dL (ref 0.40–1.20)
Chloride: 92 mEq/L — ABNORMAL LOW (ref 96–112)
GFR: 96.71 mL/min (ref >60.00)
Glucose, Bld: 121 mg/dL — ABNORMAL HIGH (ref 70–99)
POTASSIUM: 4.6 meq/L (ref 3.5–5.1)
SODIUM: 140 meq/L (ref 135–145)

## 2015-01-24 DIAGNOSIS — J449 Chronic obstructive pulmonary disease, unspecified: Secondary | ICD-10-CM | POA: Diagnosis not present

## 2015-02-06 ENCOUNTER — Ambulatory Visit: Payer: Commercial Managed Care - HMO | Admitting: Audiology

## 2015-02-09 ENCOUNTER — Encounter: Payer: Commercial Managed Care - HMO | Admitting: Internal Medicine

## 2015-02-15 ENCOUNTER — Encounter: Payer: Self-pay | Admitting: Internal Medicine

## 2015-02-18 ENCOUNTER — Other Ambulatory Visit: Payer: Self-pay | Admitting: Endocrinology

## 2015-02-23 DIAGNOSIS — J449 Chronic obstructive pulmonary disease, unspecified: Secondary | ICD-10-CM | POA: Diagnosis not present

## 2015-03-05 ENCOUNTER — Ambulatory Visit: Payer: Commercial Managed Care - HMO | Attending: Audiology | Admitting: Audiology

## 2015-03-05 DIAGNOSIS — H93213 Auditory recruitment, bilateral: Secondary | ICD-10-CM | POA: Insufficient documentation

## 2015-03-05 DIAGNOSIS — H9193 Unspecified hearing loss, bilateral: Secondary | ICD-10-CM | POA: Insufficient documentation

## 2015-03-05 DIAGNOSIS — H93293 Other abnormal auditory perceptions, bilateral: Secondary | ICD-10-CM

## 2015-03-05 DIAGNOSIS — H903 Sensorineural hearing loss, bilateral: Secondary | ICD-10-CM | POA: Diagnosis not present

## 2015-03-05 NOTE — Procedures (Signed)
[facility] [address] [place], [place] [address] [place] EVALUATION Name: Jasmine [name] DOB:  1946-01-08 MRN:  [id number]                                Diagnosis: Hearing Loss Date: 03/05/2015    Referent: [name], MD  HISTORY: Jasmine [name], age 69 y.o. years, was seen for an audiological evaluation because of concerns about hearing loss.  Jasmine [name] has experienced a "gradual hearing loss over the past 3 years" and currently "needs to turn the TV up loud" but lately "can't understand what they are saying".  She "asks people over and over what they are saying".  She reports having a "feeling of fullness in both ears all of the time".   Jasmine [name] states that she experiences two types tinnitus - one sounds like "water" and the other, which primarily happens when leaning down "is a roaring sound".  Both types of tinnitus are "intermittant". Jasmine [name] currently using portable oxygen "all of time".  She states that she has had "asthma since childhood".             EVALUATION: Pure tone air and tone conduction was completed using conventional audiometry with inserts.  Hearing thresholds are 20-25 dBHL from _0 - _1 ;p 25-30 dBHL at _2 ; 35 dBHL at _3 ; 35-70 dBHL from 4000zHz - _4  bilaterally. The hearing loss is sensorineural. Speech reception is 35 dBHL in the right ear and 25 dBHL in the left ear using recorded spondee words.  The reliability is good.  Word recognition is 92% at 60dBHL in the right and 88% at 55dBHL in the left using recorded NU-6 word lists in quiet. In minimal background noise with +5dB signal to noise ratio word recognition is 56 % in the right ear and 56% in the left ear. Otoscopic inspection reveals clear ear canals with visible tympanic membranes.  Tympanometry showed good tympanic membrane movement bilaterally (Type A) with present acoustic reflexes at _5  - _6  - high frequencies could not  keep a seal to be tested. Distortion Product Otoacoustic Emission (DPOAE) testing from _7  - 10,_8  showed no responses which is abnormal and supports abnormal outer hair cell function in the cochlea.  CONCLUSION:      Jasmine [name] has normal low frequency sloping to a moderate to severe high frequency sensorineural hearing loss bilaterally.  She has excellent word recognition on the right and very good word recognition on the left at normal conversational speech levels, in quiet. However, in minimal background her word recognition drops poor in each ear. It is important to note that Jasmine [name] has significant recruitment or lower uncomfortable loudness levels that must be considered when fitting amplification.  An ENT referral is recommended because of the two types tinnitus, persistent feelings of fullness in the ears and unsteadiness which may be due to oxygen needs but an ENT would be able to further evaluate.      Amplification helps make the signal louder and therefore often improves hearing and word recognition.  Amplification has many forms including hearing aids in one or both ears, an assistive listening device which have a microphone and speaker such as a small handheld device and/or even a surround sound system of speakers.  Amplification may be covered by some insurances, but not all.  It is important to note that hearing aids must be individually fit according  to the hearing test results and the ear shape.  Audiologists and hearing aid dealers in [place] must be licensed in order to dispense hearing aids.  In addition, a trial period is mandated by law in our state because often amplification must be tried and then evaluated in order to determine benefit.       There are many excellent choices when it comes to amplification in our area and providers are listed in the phone book under hearing aids, may be affiliated with Ear, [place] and Throat physicians, are located at [place] as well as the [facility] speech and hearing center.  RECOMMENDATIONS: 1.   Monitor hearing closely with a repeat audiological evaluation in 6 months (earlier if there is any change in hearing or ear pressure) to measure 1) word recognition in background noise in the left ear, inner ear function, tinnitus and retrocochlear function testing. 2.   Consider further evaluation of the tinnitus by an Ear, Nose and Throat physician, especially if the tinnitus changes in pitch, frequency or loudness. In addition consider tinnitus masking and or amplification at the location of Jasmine [name]'s choice.  3.  Equipment Distribution Services in [place] may help with obtaining one hearing aid or one captioned telephone if hearing loss and financial qualifications are met.  Please contact Rex Kras at [phone] .  List of Providers for [employer] [facility] April 19, 2012 - April 18, 2014. The following hearing aid companies have contracted with [facility] to fit hearing aids for eligible applicants. Representatives of these companies are not [facility] employees. Inclusion on this list means the company agrees to the terms of contract pricing and inclusion is not an endorsement of their services over those who are not contracted with the division. An applicant may choose any provider listed from your region to assist in obtaining hearing aids through this service. If problems arise during this process, please contact the [place] that provided the application.  [facility] Vendor List 1 Updated; February 14, 2013  [facility] [place], [place] [facility]   [place]    [place] Heide Spark Au.D., CCC-A Doctor of Audiology   03/05/2015  cc: [name], MD

## 2015-03-22 ENCOUNTER — Other Ambulatory Visit: Payer: Self-pay | Admitting: Endocrinology

## 2015-03-26 ENCOUNTER — Other Ambulatory Visit: Payer: Self-pay | Admitting: Internal Medicine

## 2015-03-26 DIAGNOSIS — J449 Chronic obstructive pulmonary disease, unspecified: Secondary | ICD-10-CM | POA: Diagnosis not present

## 2015-03-27 MED ORDER — ALPRAZOLAM 0.25 MG PO TABS: 0.2500 mg | ORAL_TABLET | Freq: Two times a day (BID) | ORAL | Status: DC | PRN

## 2015-03-27 NOTE — Telephone Encounter (Signed)
Rx faxed to pharmacy  

## 2015-03-27 NOTE — Telephone Encounter (Signed)
Done hardcopy to Dahlia  

## 2015-04-04 ENCOUNTER — Encounter: Payer: Self-pay | Admitting: Internal Medicine

## 2015-04-04 ENCOUNTER — Telehealth: Payer: Self-pay | Admitting: *Deleted

## 2015-04-04 DIAGNOSIS — Z1231 Encounter for screening mammogram for malignant neoplasm of breast: Secondary | ICD-10-CM

## 2015-04-04 NOTE — Telephone Encounter (Signed)
Letter = Done hardcopy to dahlia/LIM

## 2015-04-04 NOTE — Telephone Encounter (Signed)
I called pt to see when her last mammogram was. She is not sure. Order placed for screening mammogram. Pt aware she will receive a call to schedule.   While on the phone- she advised that her jury duty letter dated 02/15/15 was not accepted by the court/jury clerk. She states they are requiring a more specific letter listing her chronic medical conditions and why MD feels she should be excused. She has been summonsed for 04/16/2015. Please call pt when letter is ready.

## 2015-04-05 NOTE — Telephone Encounter (Signed)
Pt advised via personal VM 

## 2015-04-18 ENCOUNTER — Ambulatory Visit: Payer: Commercial Managed Care - HMO | Admitting: Pulmonary Disease

## 2015-04-21 ENCOUNTER — Other Ambulatory Visit: Payer: Self-pay | Admitting: Pulmonary Disease

## 2015-04-25 DIAGNOSIS — J449 Chronic obstructive pulmonary disease, unspecified: Secondary | ICD-10-CM | POA: Diagnosis not present

## 2015-05-16 ENCOUNTER — Ambulatory Visit
Admission: RE | Admit: 2015-05-16 | Discharge: 2015-05-16 | Disposition: A | Discharge status: Home / Self Care | Payer: Commercial Managed Care - HMO | Source: Ambulatory Visit | Attending: Internal Medicine | Admitting: Internal Medicine

## 2015-05-16 DIAGNOSIS — Z1231 Encounter for screening mammogram for malignant neoplasm of breast: Secondary | ICD-10-CM

## 2015-05-16 LAB — HM MAMMOGRAPHY

## 2015-05-17 ENCOUNTER — Encounter: Payer: Self-pay | Admitting: *Deleted

## 2015-05-26 DIAGNOSIS — J449 Chronic obstructive pulmonary disease, unspecified: Secondary | ICD-10-CM | POA: Diagnosis not present

## 2015-06-02 ENCOUNTER — Other Ambulatory Visit: Payer: Self-pay | Admitting: Pulmonary Disease

## 2015-06-16 ENCOUNTER — Other Ambulatory Visit: Payer: Self-pay | Admitting: Endocrinology

## 2015-06-26 DIAGNOSIS — J449 Chronic obstructive pulmonary disease, unspecified: Secondary | ICD-10-CM | POA: Diagnosis not present

## 2015-07-07 IMAGING — CR DG HIP COMPLETE 2+V*R*
3 series · 3 of 3 positions shown · non-contrast
Comparison: None.

CLINICAL DATA: Right hip pain without trauma.

EXAM:
RIGHT HIP - COMPLETE 2+ VIEW

[t pelvis a.p.]
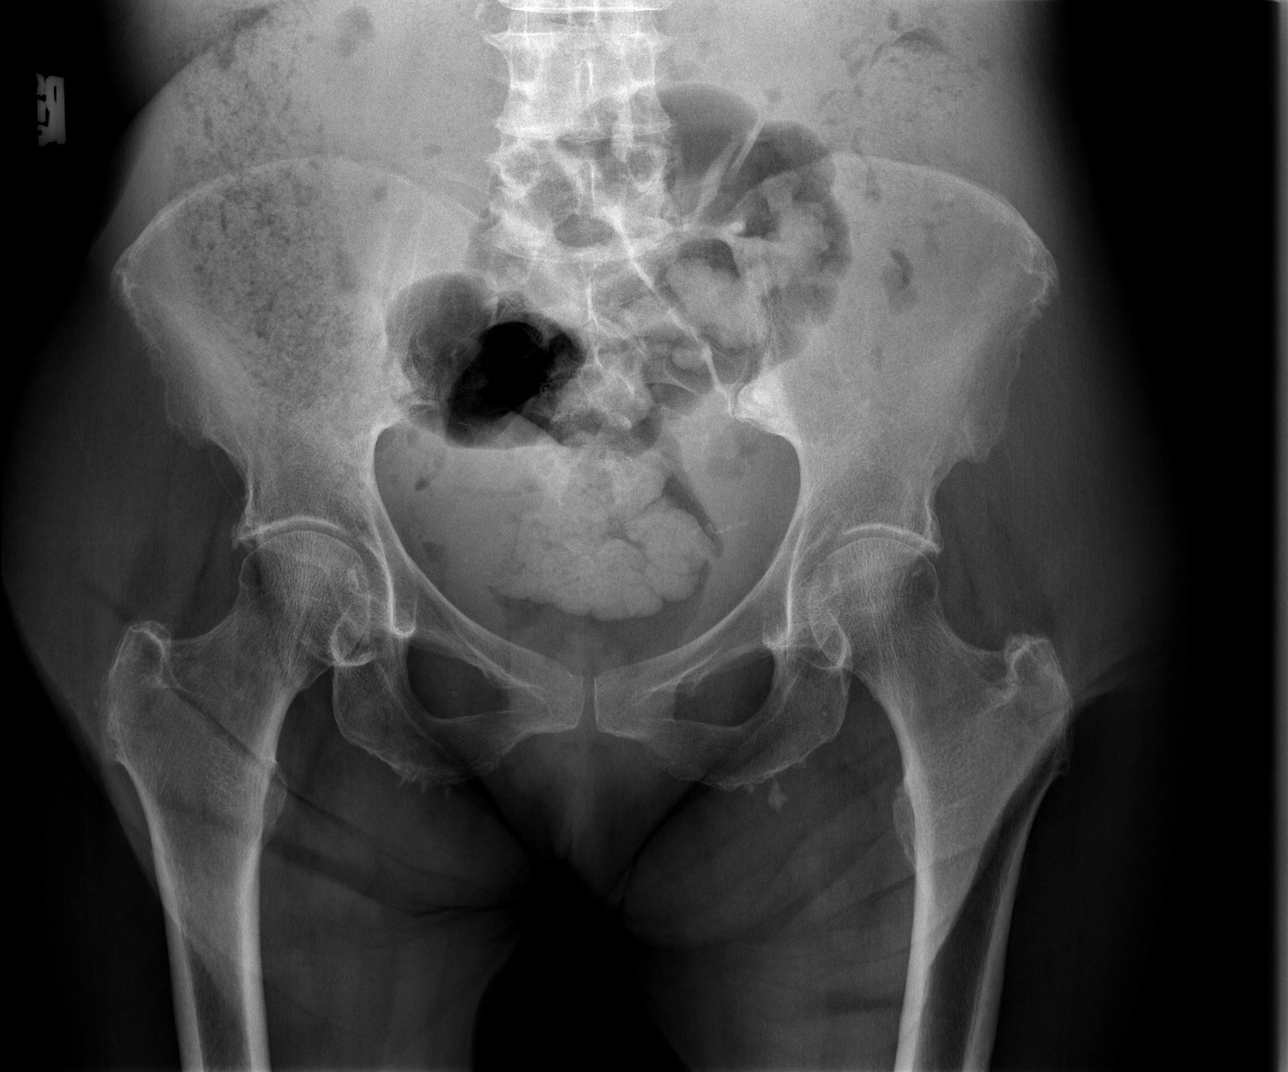

[t hip ap right]
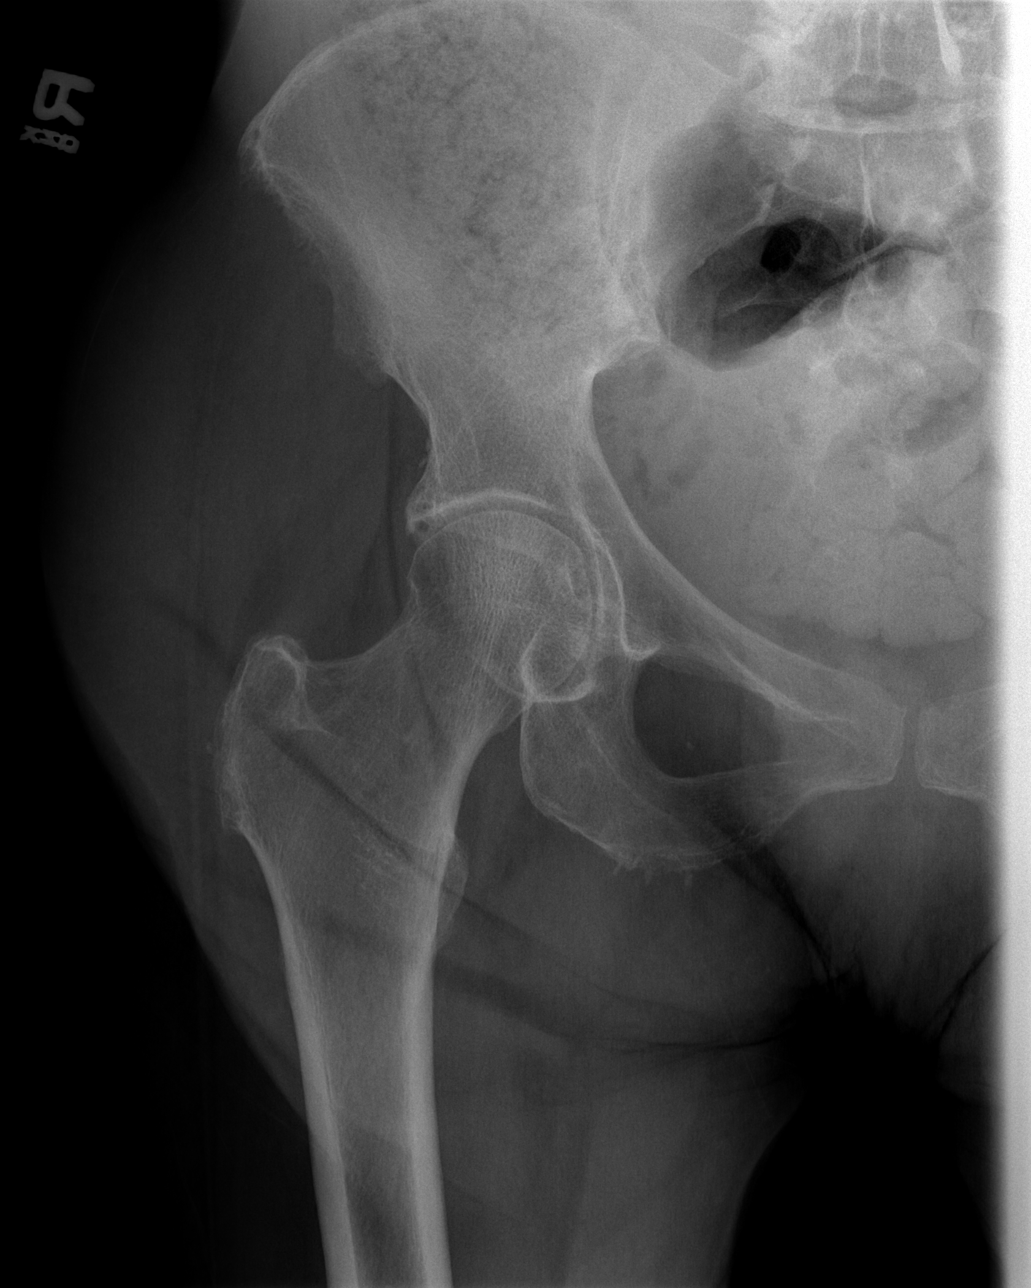

[t hip frog leg right]
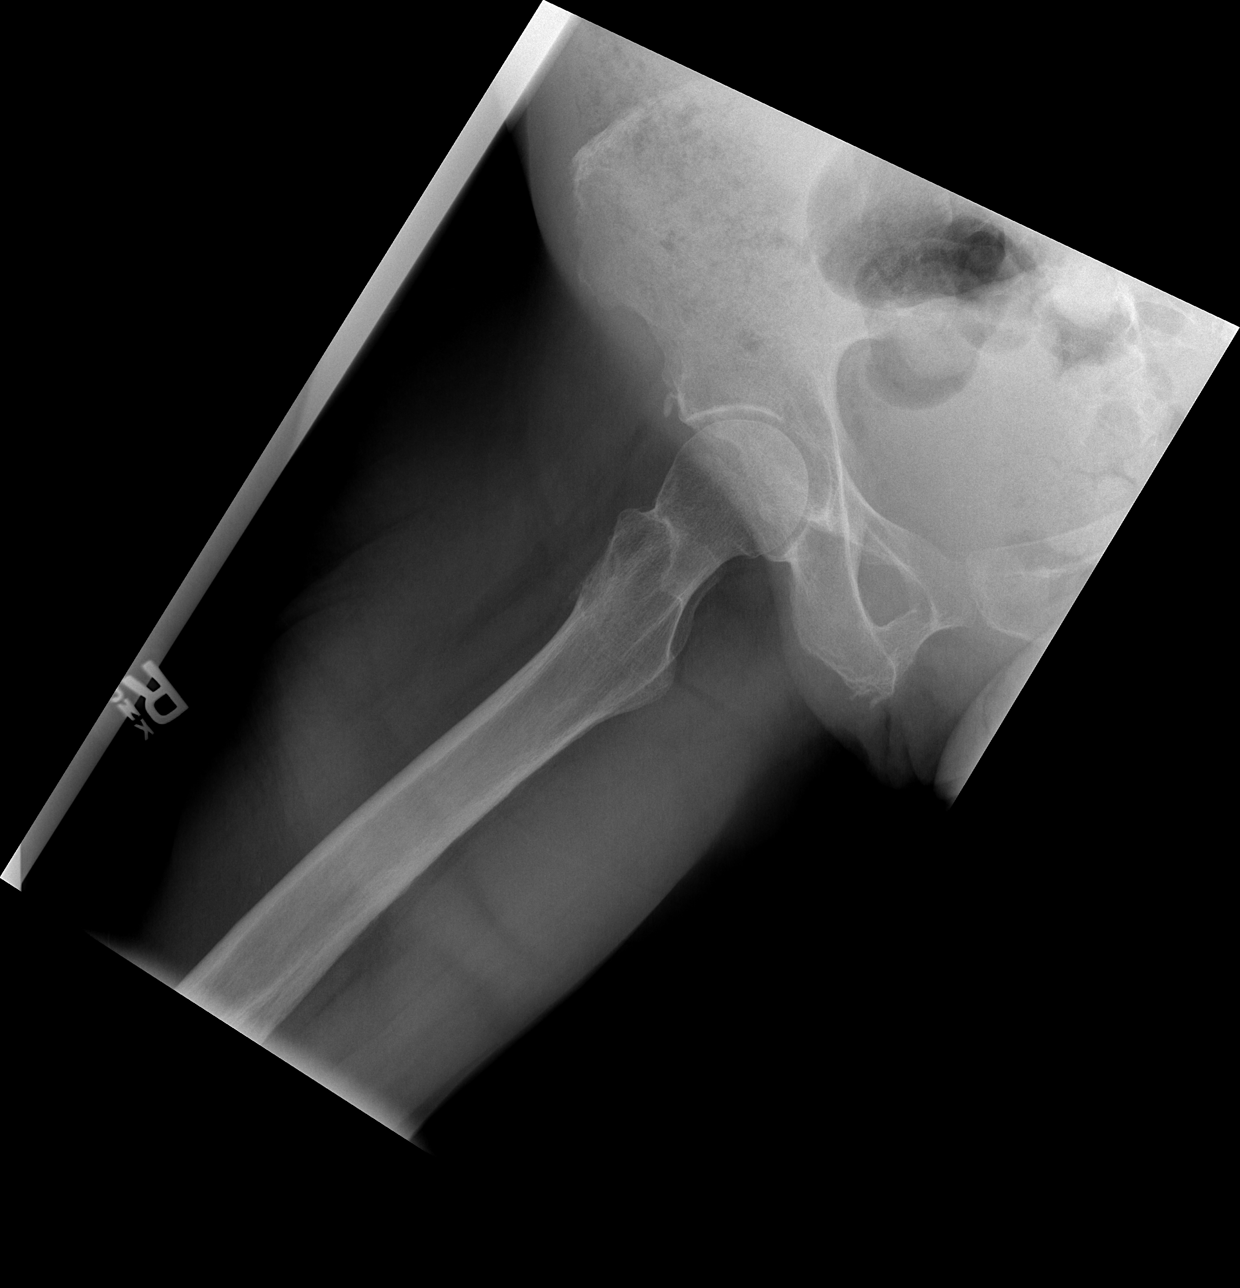

[3 of 3 positions shown; findings below may reference images not displayed]

FINDINGS: AP view of the pelvis and AP/ frog-leg views of the right hip. No
acute fracture or dislocation. Joint spaces are maintained for age.
Sacroiliac joints are symmetric. Large amount of colonic stool.
IMPRESSION: No acute osseous abnormality.

## 2015-07-12 ENCOUNTER — Other Ambulatory Visit: Payer: Self-pay | Admitting: Pulmonary Disease

## 2015-07-21 ENCOUNTER — Other Ambulatory Visit: Payer: Self-pay | Admitting: Pulmonary Disease

## 2015-07-26 DIAGNOSIS — J449 Chronic obstructive pulmonary disease, unspecified: Secondary | ICD-10-CM | POA: Diagnosis not present

## 2015-07-30 ENCOUNTER — Telehealth: Payer: Self-pay

## 2015-07-30 NOTE — Telephone Encounter (Signed)
No AWV this year; called to try and schedule AWV and LVM for Call back

## 2015-08-01 NOTE — Telephone Encounter (Signed)
Call to the patient to schedule for AWV; Lvm explaining AWV and to call the practice for questions or to schedule

## 2015-08-19 ENCOUNTER — Other Ambulatory Visit: Payer: Self-pay | Admitting: Pulmonary Disease

## 2015-08-22 ENCOUNTER — Other Ambulatory Visit: Payer: Self-pay | Admitting: Pulmonary Disease

## 2015-08-22 ENCOUNTER — Telehealth: Payer: Self-pay | Admitting: Pulmonary Disease

## 2015-08-22 MED ORDER — MOMETASONE FURO-FORMOTEROL FUM 200-5 MCG/ACT IN AERO: INHALATION_SPRAY | RESPIRATORY_TRACT | Status: DC

## 2015-08-22 MED ORDER — TIOTROPIUM BROMIDE MONOHYDRATE 18 MCG IN CAPS: 18.0000 ug | ORAL_CAPSULE | Freq: Every day | RESPIRATORY_TRACT | Status: DC

## 2015-08-22 NOTE — Telephone Encounter (Signed)
Spoke with pt. Needs refills on Dulera and Spiriva. Advised her that she needs a ROV. This has been scheduled for 11/27/15 at 3:30pm with RA. Her prescriptions will be sent in to last until this appointment. Nothing further was needed.

## 2015-08-26 DIAGNOSIS — J449 Chronic obstructive pulmonary disease, unspecified: Secondary | ICD-10-CM | POA: Diagnosis not present

## 2015-09-08 ENCOUNTER — Other Ambulatory Visit: Payer: Self-pay | Admitting: Endocrinology

## 2015-09-25 ENCOUNTER — Telehealth: Payer: Self-pay | Admitting: Internal Medicine

## 2015-09-25 ENCOUNTER — Other Ambulatory Visit: Payer: Self-pay | Admitting: Internal Medicine

## 2015-09-25 DIAGNOSIS — J449 Chronic obstructive pulmonary disease, unspecified: Secondary | ICD-10-CM | POA: Diagnosis not present

## 2015-09-25 NOTE — Telephone Encounter (Signed)
[facility] 

## 2015-09-25 NOTE — Telephone Encounter (Signed)
Please advise, thanks.

## 2015-09-25 NOTE — Telephone Encounter (Signed)
Not sure how to help, I suspect this is an pharmacy error, or an insurance issue where possibly a benefit for her meds of some type has run out now at the end of the year  I can prescribe this again if needed; or consider change to symbicort or advair if less expensive with her insurance

## 2015-09-25 NOTE — Telephone Encounter (Signed)
Done hardcopy to steph 

## 2015-09-25 NOTE — Telephone Encounter (Signed)
[facility] and the pharmacy told her it would be 45.00.  She states she told them that she did not want to get it because she normally did not pay anything for it.  She called to pharmacy back today to talk to them about the cost and they told her that she had already got it.  Patient states she talked to the pharmacy manager and he is reviewing this.  She states she is currently out of the medication and would like to know if [name] can do anything to help her get this med.

## 2015-09-26 NOTE — Telephone Encounter (Signed)
Spoke to pt. Pt is calling the pharmacy back to see if they were able to come up with anything about the missing medication.   Informed pt that we would send something less expensive in if needed.

## 2015-09-27 ENCOUNTER — Ambulatory Visit: Payer: Commercial Managed Care - HMO | Admitting: Endocrinology

## 2015-10-09 ENCOUNTER — Encounter: Payer: Self-pay | Admitting: Endocrinology

## 2015-10-09 ENCOUNTER — Ambulatory Visit (INDEPENDENT_AMBULATORY_CARE_PROVIDER_SITE_OTHER): Payer: Commercial Managed Care - HMO | Admitting: Endocrinology

## 2015-10-09 VITALS — BP 132/64 | HR 89 | Temp 98.4°F | Ht 61.0 in | Wt 92.0 lb

## 2015-10-09 DIAGNOSIS — E890 Postprocedural hypothyroidism: Secondary | ICD-10-CM

## 2015-10-09 LAB — TSH: TSH: 2.11 u[IU]/mL (ref 0.35–4.50)

## 2015-10-09 NOTE — Progress Notes (Signed)
Subjective:    Patient ID: Jasmine [name], female    DOB: August 31, 1946, 69 y.o.   MRN: [id number]  HPI Pt returns for f/u of post-I-131 hypothyroidism (in 2006, she had I-131 rx for hyperthyroidism, due to [place]. she has been on prescribed thyroid hormone therapy since then).  pt states she feels well in general, except for dry skin.  Past Medical History  Diagnosis Date  . ALLERGIC RHINITIS 05/01/2007  . ASTHMA 05/01/2007  . BACK PAIN 10/10/2008  . DEPRESSION 07/21/2008  . FATIGUE 10/10/2010  . HYPERTENSION 05/01/2007  . HYPERTHYROIDISM 11/23/2007    Pt endorses having had Graves disease, possibly radioactive iodine x1, but no thyroidectomy  . HYPOTHYROIDISM 08/23/2009  . LUMBAR RADICULOPATHY, RIGHT 08/25/2008  . OSTEOPOROSIS 05/01/2007  . SHOULDER PAIN, LEFT 08/23/2009  . SINUSITIS, CHRONIC 10/10/2010  . SINUSITIS- ACUTE-NOS 10/10/2008  . H/O blood clots     left leg 1960's    Past Surgical History  Procedure Laterality Date  . Appendectomy    . Ectopic pregnancy surgery    . Post fallopian tube removal right side    . History of multiple dialations and curettages and miscarriages, unfortunately never carrying a child to term    . Electrocardiogram  06/20/2006  . Tubal ligation      Social History   Social History  . Marital Status: Married    Spouse Name: N/A  . Number of Children: N/A  . Years of Education: N/A   Occupational History  . RETIRED    Social History Main Topics  . Smoking status: Current Every Day Smoker -- 0.05 packs/day for 40 years    Types: Cigarettes  . Smokeless tobacco: Never Used     Comment: 2-3 cigs/daily  . Alcohol Use: 1.2 oz/week    2 Cans of beer per week     Comment: 2 beers a day, occasional liquor, ? minimizing  . Drug Use: 2.00 per week    Special: Cocaine     Comment: Crack cocaine-sometimes weekends and holidays  . Sexual Activity: Not on file   Other Topics Concern  . Not on file   Social History Narrative   Lives in  [place], [place]. Lives with husband, no children, but husband has 3 kids. Ambulatory, without cane or walker. Smokes cigarettes, currently 1 ppd, smoked for >20-30 yrs. Drinks beer, ~2/day, occasionally liquor, but ? suspect minimizing. Occasional MJ and endorses crack cocaine occasionally    Current Outpatient Prescriptions on File Prior to Visit  Medication Sig Dispense Refill  . albuterol (PROVENTIL) (2.5 MG/3ML) 0.083% nebulizer solution Take 3 mLs (2.5 mg total) by nebulization every 4 (four) hours as needed for wheezing. 75 mL 12  . ALPRAZolam (XANAX) 0.25 MG tablet TAKE 1 TABLET BY MOUTH TWICE DAILY 60 tablet 1  . citalopram (CELEXA) 10 MG tablet TAKE 1 TABLET EVERY DAY 90 tablet 3  . furosemide (LASIX) 40 MG tablet Take 1 tablet (40 mg total) by mouth daily. 30 tablet 11  . levothyroxine (SYNTHROID, LEVOTHROID) 50 MCG tablet TAKE 1 TABLET EVERY DAY 30 tablet 2  . mometasone-formoterol (DULERA) 200-5 MCG/ACT AERO INHALE 2 PUFFS INTO THE LUNGS 2 (TWO) TIMES DAILY. 13 g 2  . potassium chloride (K-DUR) 10 MEQ tablet Take 1 tablet (10 mEq total) by mouth 2 (two) times daily. 60 tablet 11  . tiotropium (SPIRIVA HANDIHALER) 18 MCG inhalation capsule Place 1 capsule (18 mcg total) into inhaler and inhale daily. 30 capsule 2  . triamcinolone cream (  KENALOG) 0.1 % Apply 1 application topically 2 (two) times daily as needed ("break out on arms").    . cetirizine (ZYRTEC) 10 MG tablet TAKE 1 TABLET (10 MG TOTAL) BY MOUTH DAILY. (Patient not taking: Reported on 10/09/2015) 30 tablet 0   No current facility-administered medications on file prior to visit.    Allergies  Allergen Reactions  . Alendronate Sodium Other (See Comments)    Family History  Problem Relation Age of Onset  . Alcohol abuse Brother   . Diabetes Brother   . Hypertension Other   . Lung cancer Father   . Stroke Mother   . Asthma      maternal aunts  . Thyroid disease Mother     BP 132/64 mmHg  Pulse 89  Temp(Src) 98.4  F (36.9 C) (Oral)  Ht 5\' 1"  (1.549 m)  Wt 92 lb (41.731 kg)  BMI 17.39 kg/m2  SpO2 94%  Review of Systems No weight change    Objective:   Physical Exam VITAL SIGNS:  See vs page GENERAL: no distress.  Has 02 on.  NECK: There is no palpable thyroid enlargement.  No thyroid nodule is palpable.  No palpable lymphadenopathy at the anterior neck.     Lab Results  Component Value Date   TSH 2.11 10/09/2015      Assessment & Plan:  Hypothyroidism: well-replaced on less than a full replacement dosage.  She is therefore at risk for recurrent hyperthyroidism (increasing hypothyroidism is also possible, but less likely).    Patient is advised the following: Patient Instructions  blood tests are being requested for you today.  We'll let you know about the results. Please return in 1 year. You require less than a full daily amount of thyroid hormone.  This means that the thyroid was not completely destroyed by the radioactive iodine.  Therefore, the amount you need will likely go up or down, so we should follow it.

## 2015-10-09 NOTE — Patient Instructions (Signed)
blood tests are being requested for you today.  We'll let you know about the results. Please return in 1 year. You require less than a full daily amount of thyroid hormone.  This means that the thyroid was not completely destroyed by the radioactive iodine.  Therefore, the amount you need will likely go up or down, so we should follow it.   

## 2015-10-12 ENCOUNTER — Other Ambulatory Visit: Payer: Self-pay | Admitting: Geriatric Medicine

## 2015-10-12 MED ORDER — CITALOPRAM HYDROBROMIDE 10 MG PO TABS: 10.0000 mg | ORAL_TABLET | Freq: Every day | ORAL | Status: DC

## 2015-10-12 MED ORDER — POTASSIUM CHLORIDE ER 10 MEQ PO TBCR: 10.0000 meq | EXTENDED_RELEASE_TABLET | Freq: Two times a day (BID) | ORAL | Status: DC

## 2015-10-12 MED ORDER — FUROSEMIDE 40 MG PO TABS: 40.0000 mg | ORAL_TABLET | Freq: Every day | ORAL | Status: DC

## 2015-10-12 MED ORDER — LEVOTHYROXINE SODIUM 50 MCG PO TABS: 50.0000 ug | ORAL_TABLET | Freq: Every day | ORAL | Status: DC

## 2015-10-26 DIAGNOSIS — J449 Chronic obstructive pulmonary disease, unspecified: Secondary | ICD-10-CM | POA: Diagnosis not present

## 2015-11-12 ENCOUNTER — Telehealth: Payer: Self-pay

## 2015-11-12 NOTE — Telephone Encounter (Signed)
rf rq for a new pharmacy to fill alprazolam.   pls advise.   New pharmacy is [facility]: fax: [phone] ([place])

## 2015-11-13 NOTE — Telephone Encounter (Signed)
Rx request too soon, last Rx'd 09/28/2015 #60 x 1

## 2015-11-26 DIAGNOSIS — J449 Chronic obstructive pulmonary disease, unspecified: Secondary | ICD-10-CM | POA: Diagnosis not present

## 2015-11-27 ENCOUNTER — Ambulatory Visit: Payer: Commercial Managed Care - HMO | Admitting: Pulmonary Disease

## 2015-12-19 ENCOUNTER — Telehealth: Payer: Self-pay | Admitting: Pulmonary Disease

## 2015-12-19 MED ORDER — MOMETASONE FURO-FORMOTEROL FUM 200-5 MCG/ACT IN AERO: INHALATION_SPRAY | RESPIRATORY_TRACT | Status: DC

## 2015-12-19 NOTE — Telephone Encounter (Signed)
Spoke with pt, requesting refill on Dulera. Pt has not been seen in over 1 year, needs ov for refills. Pt scheduled with TP on Monday.  rx sent to preferred pharmacy.  Nothing further needed.

## 2015-12-20 ENCOUNTER — Telehealth: Payer: Self-pay | Admitting: *Deleted

## 2015-12-20 NOTE — Telephone Encounter (Signed)
Initiated PA thru [facility] for Dulera 200. Key: [id number] Pt ID: [id number]  Submitted for review.

## 2015-12-21 ENCOUNTER — Telehealth: Payer: Self-pay | Admitting: Pulmonary Disease

## 2015-12-21 MED ORDER — FLUTICASONE FUROATE-VILANTEROL 200-25 MCG/INH IN AEPB: 1.0000 | INHALATION_SPRAY | Freq: Every day | RESPIRATORY_TRACT | Status: DC

## 2015-12-21 NOTE — Telephone Encounter (Signed)
lmtcb x1 for pt. 

## 2015-12-21 NOTE — Telephone Encounter (Signed)
Called and left detailed message advising patient that Memory Dance was sent to [place] for her to try. Advised patient to contact our office if she has any questions. Nothing further needed.

## 2015-12-21 NOTE — Telephone Encounter (Signed)
Spoke with pt. States that her insurance will not cover Dulera. They will cover Breo 200 and Advair 250.  RA - please advise on alternative. Thanks.

## 2015-12-21 NOTE — Telephone Encounter (Signed)
breo once daily

## 2015-12-21 NOTE — Telephone Encounter (Signed)
Pt returning call.Jasmine [name] ° °

## 2015-12-24 ENCOUNTER — Inpatient Hospital Stay (HOSPITAL_COMMUNITY)
Admission: EM | Admit: 2015-12-24 | Discharge: 2015-12-27 | DRG: 190 | Disposition: A | Discharge status: Home Health | Payer: Commercial Managed Care - HMO | Attending: Internal Medicine | Admitting: Internal Medicine

## 2015-12-24 ENCOUNTER — Encounter (HOSPITAL_COMMUNITY): Payer: Self-pay

## 2015-12-24 ENCOUNTER — Ambulatory Visit: Payer: Commercial Managed Care - HMO | Admitting: Adult Health

## 2015-12-24 ENCOUNTER — Emergency Department (HOSPITAL_COMMUNITY): Payer: Commercial Managed Care - HMO

## 2015-12-24 DIAGNOSIS — I509 Heart failure, unspecified: Secondary | ICD-10-CM | POA: Insufficient documentation

## 2015-12-24 DIAGNOSIS — Z7951 Long term (current) use of inhaled steroids: Secondary | ICD-10-CM

## 2015-12-24 DIAGNOSIS — J45909 Unspecified asthma, uncomplicated: Secondary | ICD-10-CM | POA: Diagnosis not present

## 2015-12-24 DIAGNOSIS — B9561 Methicillin susceptible Staphylococcus aureus infection as the cause of diseases classified elsewhere: Secondary | ICD-10-CM | POA: Diagnosis not present

## 2015-12-24 DIAGNOSIS — E890 Postprocedural hypothyroidism: Secondary | ICD-10-CM | POA: Diagnosis present

## 2015-12-24 DIAGNOSIS — E038 Other specified hypothyroidism: Secondary | ICD-10-CM | POA: Diagnosis not present

## 2015-12-24 DIAGNOSIS — R0602 Shortness of breath: Secondary | ICD-10-CM

## 2015-12-24 DIAGNOSIS — Z720 Tobacco use: Secondary | ICD-10-CM | POA: Diagnosis present

## 2015-12-24 DIAGNOSIS — Z79899 Other long term (current) drug therapy: Secondary | ICD-10-CM

## 2015-12-24 DIAGNOSIS — R000 Tachycardia, unspecified: Secondary | ICD-10-CM | POA: Diagnosis not present

## 2015-12-24 DIAGNOSIS — M810 Age-related osteoporosis without current pathological fracture: Secondary | ICD-10-CM | POA: Diagnosis present

## 2015-12-24 DIAGNOSIS — J9620 Acute and chronic respiratory failure, unspecified whether with hypoxia or hypercapnia: Secondary | ICD-10-CM | POA: Diagnosis present

## 2015-12-24 DIAGNOSIS — I110 Hypertensive heart disease with heart failure: Secondary | ICD-10-CM | POA: Diagnosis present

## 2015-12-24 DIAGNOSIS — Z9981 Dependence on supplemental oxygen: Secondary | ICD-10-CM

## 2015-12-24 DIAGNOSIS — Z888 Allergy status to other drugs, medicaments and biological substances status: Secondary | ICD-10-CM

## 2015-12-24 DIAGNOSIS — T502X5A Adverse effect of carbonic-anhydrase inhibitors, benzothiadiazides and other diuretics, initial encounter: Secondary | ICD-10-CM | POA: Diagnosis present

## 2015-12-24 DIAGNOSIS — I10 Essential (primary) hypertension: Secondary | ICD-10-CM | POA: Diagnosis present

## 2015-12-24 DIAGNOSIS — Z87891 Personal history of nicotine dependence: Secondary | ICD-10-CM | POA: Diagnosis not present

## 2015-12-24 DIAGNOSIS — E43 Unspecified severe protein-calorie malnutrition: Secondary | ICD-10-CM | POA: Diagnosis not present

## 2015-12-24 DIAGNOSIS — F418 Other specified anxiety disorders: Secondary | ICD-10-CM | POA: Diagnosis not present

## 2015-12-24 DIAGNOSIS — J449 Chronic obstructive pulmonary disease, unspecified: Secondary | ICD-10-CM | POA: Diagnosis not present

## 2015-12-24 DIAGNOSIS — M5416 Radiculopathy, lumbar region: Secondary | ICD-10-CM | POA: Diagnosis not present

## 2015-12-24 DIAGNOSIS — J9690 Respiratory failure, unspecified, unspecified whether with hypoxia or hypercapnia: Secondary | ICD-10-CM | POA: Diagnosis not present

## 2015-12-24 DIAGNOSIS — B954 Other streptococcus as the cause of diseases classified elsewhere: Secondary | ICD-10-CM | POA: Diagnosis not present

## 2015-12-24 DIAGNOSIS — I5032 Chronic diastolic (congestive) heart failure: Secondary | ICD-10-CM | POA: Diagnosis present

## 2015-12-24 DIAGNOSIS — J441 Chronic obstructive pulmonary disease with (acute) exacerbation: Principal | ICD-10-CM | POA: Diagnosis present

## 2015-12-24 DIAGNOSIS — R739 Hyperglycemia, unspecified: Secondary | ICD-10-CM | POA: Diagnosis present

## 2015-12-24 DIAGNOSIS — R7881 Bacteremia: Secondary | ICD-10-CM | POA: Diagnosis not present

## 2015-12-24 DIAGNOSIS — F17210 Nicotine dependence, cigarettes, uncomplicated: Secondary | ICD-10-CM | POA: Diagnosis present

## 2015-12-24 DIAGNOSIS — R7989 Other specified abnormal findings of blood chemistry: Secondary | ICD-10-CM | POA: Diagnosis not present

## 2015-12-24 DIAGNOSIS — J9611 Chronic respiratory failure with hypoxia: Secondary | ICD-10-CM | POA: Diagnosis not present

## 2015-12-24 DIAGNOSIS — J9621 Acute and chronic respiratory failure with hypoxia: Secondary | ICD-10-CM | POA: Diagnosis not present

## 2015-12-24 DIAGNOSIS — E039 Hypothyroidism, unspecified: Secondary | ICD-10-CM | POA: Diagnosis not present

## 2015-12-24 DIAGNOSIS — J438 Other emphysema: Secondary | ICD-10-CM | POA: Diagnosis not present

## 2015-12-24 HISTORY — DX: Heart failure, unspecified: I50.9

## 2015-12-24 LAB — COMPREHENSIVE METABOLIC PANEL
ALBUMIN: 3.2 g/dL — AB (ref 3.5–5.0)
ALK PHOS: 65 U/L (ref 38–126)
ALT: 27 U/L (ref 14–54)
ANION GAP: 10 (ref 5–15)
AST: 27 U/L (ref 15–41)
BILIRUBIN TOTAL: 0.4 mg/dL (ref 0.3–1.2)
BUN: 7 mg/dL (ref 6–20)
CALCIUM: 9.2 mg/dL (ref 8.9–10.3)
CO2: 42 mmol/L — ABNORMAL HIGH (ref 22–32)
CREATININE: 0.74 mg/dL (ref 0.44–1.00)
Chloride: 87 mmol/L — ABNORMAL LOW (ref 101–111)
GFR calc Af Amer: >60 mL/min (ref >60)
GFR calc non Af Amer: >60 mL/min (ref >60)
GLUCOSE: 155 mg/dL — AB (ref 65–99)
Potassium: 4.1 mmol/L (ref 3.5–5.1)
Sodium: 139 mmol/L (ref 135–145)
TOTAL PROTEIN: 6.8 g/dL (ref 6.5–8.1)

## 2015-12-24 LAB — CBC
HCT: 40.8 % (ref 36.0–46.0)
Hemoglobin: 12.5 g/dL (ref 12.0–15.0)
MCH: 31.6 pg (ref 26.0–34.0)
MCHC: 30.6 g/dL (ref 30.0–36.0)
MCV: 103.3 fL — ABNORMAL HIGH (ref 78.0–100.0)
PLATELETS: 194 10*3/uL (ref 150–400)
RBC: 3.95 MIL/uL (ref 3.87–5.11)
RDW: 14.2 % (ref 11.5–15.5)
WBC: 5.1 10*3/uL (ref 4.0–10.5)

## 2015-12-24 LAB — URINE MICROSCOPIC-ADD ON

## 2015-12-24 LAB — URINALYSIS, ROUTINE W REFLEX MICROSCOPIC
BILIRUBIN URINE: NEGATIVE
GLUCOSE, UA: NEGATIVE mg/dL
HGB URINE DIPSTICK: NEGATIVE
KETONES UR: NEGATIVE mg/dL
Leukocytes, UA: NEGATIVE
Nitrite: NEGATIVE
PROTEIN: 30 mg/dL — AB
Specific Gravity, Urine: 1.012 (ref 1.005–1.030)
pH: 6.5 (ref 5.0–8.0)

## 2015-12-24 LAB — MAGNESIUM: MAGNESIUM: 1.5 mg/dL — AB (ref 1.7–2.4)

## 2015-12-24 LAB — PHOSPHORUS: PHOSPHORUS: 3.1 mg/dL (ref 2.5–4.6)

## 2015-12-24 LAB — I-STAT TROPONIN, ED: Troponin i, poc: 0 ng/mL (ref 0.00–0.08)

## 2015-12-24 LAB — BRAIN NATRIURETIC PEPTIDE: B Natriuretic Peptide: 26.7 pg/mL (ref 0.0–100.0)

## 2015-12-24 LAB — I-STAT CG4 LACTIC ACID, ED: Lactic Acid, Venous: 1.59 mmol/L (ref 0.5–2.0)

## 2015-12-24 MED ORDER — ALBUTEROL SULFATE (2.5 MG/3ML) 0.083% IN NEBU
INHALATION_SOLUTION | RESPIRATORY_TRACT | Status: AC
  Filled 2015-12-24: qty 3

## 2015-12-24 MED ORDER — ALBUTEROL SULFATE (2.5 MG/3ML) 0.083% IN NEBU
2.5000 mg | INHALATION_SOLUTION | Freq: Once | RESPIRATORY_TRACT | Status: AC
  Administered 2015-12-24: 2.5 mg via RESPIRATORY_TRACT

## 2015-12-24 MED ORDER — FUROSEMIDE 10 MG/ML IJ SOLN
40.0000 mg | Freq: Once | INTRAMUSCULAR | Status: AC
  Administered 2015-12-24: 40 mg via INTRAVENOUS
  Filled 2015-12-24: qty 4

## 2015-12-24 MED ORDER — METHYLPREDNISOLONE SODIUM SUCC 125 MG IJ SOLR
125.0000 mg | Freq: Once | INTRAMUSCULAR | Status: AC
  Administered 2015-12-24: 125 mg via INTRAVENOUS
  Filled 2015-12-24: qty 2

## 2015-12-24 NOTE — Telephone Encounter (Signed)
Dulera has been denied

## 2015-12-24 NOTE — Progress Notes (Signed)
Pharmacy Code Sepsis Protocol  Time of code sepsis page: 2002 []  Antibiotics delivered at  []  Antibiotics administered prior to code at  (if checked, omit next 2 questions)  Were antibiotics ordered at the time of the code sepsis page? No Was it required to contact the physician? []  Physician not contacted []  Physician contacted to order antibiotics for code sepsis []  Physician contacted to recommend changing antibiotics  Patient is being worked up for flu - MD is holding off on ABX at this time  Levester Fresh, PharmD, BCPS, [facility] Clinical Pharmacist Pager [phone] 12/24/2015 8:11 PM

## 2015-12-24 NOTE — ED Provider Notes (Signed)
CSN: [id number]     Arrival date & time 12/24/15  1936 History   First MD Initiated Contact with Patient 12/24/15 1940     Chief Complaint  Patient presents with  . Respiratory Distress   (Consider location/radiation/quality/duration/timing/severity/associated sxs/prior Treatment) HPI 70 y.o. female with a hx of Asthma, HTN, CHF, presents to the Emergency Department today due to shortness of breath sustained today while visiting friend in nursing facility. Initial EMS report states that she hypoxic in the 60s and placed on NRB with sats in 90s. No CP/ABD pain. No N/V/D. No pain. Further HPI difficult to obtain from patient due to hypoxia, she appears to fall asleep intermitetntly and is difficult to obtain accurate HPI. Pt states that she is tired.    Pt normally on lasix 40mg  PO daily, but did not take medication this morning.    Past Medical History  Diagnosis Date  . ALLERGIC RHINITIS 05/01/2007  . ASTHMA 05/01/2007  . BACK PAIN 10/10/2008  . DEPRESSION 07/21/2008  . FATIGUE 10/10/2010  . HYPERTENSION 05/01/2007  . HYPERTHYROIDISM 11/23/2007    Pt endorses having had Graves disease, possibly radioactive iodine x1, but no thyroidectomy  . HYPOTHYROIDISM 08/23/2009  . LUMBAR RADICULOPATHY, RIGHT 08/25/2008  . OSTEOPOROSIS 05/01/2007  . SHOULDER PAIN, LEFT 08/23/2009  . SINUSITIS, CHRONIC 10/10/2010  . SINUSITIS- ACUTE-NOS 10/10/2008  . H/O blood clots     left leg 1960's  . CHF (congestive heart failure) [facility])    Past Surgical History  Procedure Laterality Date  . Appendectomy    . Ectopic pregnancy surgery    . Post fallopian tube removal right side    . History of multiple dialations and curettages and miscarriages, unfortunately never carrying a child to term    . Electrocardiogram  06/20/2006  . Tubal ligation     Family History  Problem Relation Age of Onset  . Alcohol abuse Brother   . Diabetes Brother   . Hypertension Other   . Lung cancer Father   . Stroke Mother   .  Asthma      maternal aunts  . Thyroid disease Mother    Social History  Substance Use Topics  . Smoking status: Current Every Day Smoker -- 0.05 packs/day for 40 years    Types: Cigarettes  . Smokeless tobacco: Never Used     Comment: 2-3 cigs/daily  . Alcohol Use: 1.2 oz/week    2 Cans of beer per week     Comment: 2 beers a day, occasional liquor, ? minimizing   OB History    No data available     Review of Systems ROS reviewed and all are negative for acute change except as noted in the HPI.  Allergies  Alendronate sodium  Home Medications   Prior to Admission medications   Medication Sig Start Date End Date Taking? Authorizing Provider  albuterol (PROVENTIL) (2.5 MG/3ML) 0.083% nebulizer solution Take 3 mLs (2.5 mg total) by nebulization every 4 (four) hours as needed for wheezing. 06/25/14   Theodis Blaze, MD  ALPRAZolam Duanne Moron) 0.25 MG tablet TAKE 1 TABLET BY MOUTH TWICE DAILY 09/25/15   Biagio Borg, MD  cetirizine (ZYRTEC) 10 MG tablet TAKE 1 TABLET (10 MG TOTAL) BY MOUTH DAILY. Patient not taking: Reported on 10/09/2015 06/04/15   Rigoberto Noel, MD  citalopram (CELEXA) 10 MG tablet Take 1 tablet (10 mg total) by mouth daily. 10/12/15   Biagio Borg, MD  fluticasone furoate-vilanterol (BREO ELLIPTA) 200-25 MCG/INH AEPB Inhale  1 puff into the lungs daily. 12/21/15   Rigoberto Noel, MD  furosemide (LASIX) 40 MG tablet Take 1 tablet (40 mg total) by mouth daily. 10/12/15   Biagio Borg, MD  levothyroxine (SYNTHROID, LEVOTHROID) 50 MCG tablet Take 1 tablet (50 mcg total) by mouth daily. 10/12/15   Biagio Borg, MD  mometasone-formoterol (DULERA) 200-5 MCG/ACT AERO INHALE 2 PUFFS INTO THE LUNGS 2 (TWO) TIMES DAILY. 12/19/15   Rigoberto Noel, MD  potassium chloride (K-DUR) 10 MEQ tablet Take 1 tablet (10 mEq total) by mouth 2 (two) times daily. 10/12/15   Biagio Borg, MD  tiotropium (SPIRIVA HANDIHALER) 18 MCG inhalation capsule Place 1 capsule (18 mcg total) into inhaler and inhale  daily. 08/22/15   Rigoberto Noel, MD  triamcinolone cream (KENALOG) 0.1 % Apply 1 application topically 2 (two) times daily as needed ("break out on arms").    Historical Provider, MD   BP 112/65 mmHg  Pulse 92  Temp(Src) 101.6 F (38.7 C)  Resp 17  Ht 5\' 2"  (1.575 m)  Wt 41.731 kg  BMI 16.82 kg/m2  SpO2 100%   Physical Exam  Constitutional: She is oriented to person, place, and time. She appears well-developed and well-nourished.  HENT:  Head: Normocephalic and atraumatic.  Right Ear: Tympanic membrane, external ear and ear canal normal.  Left Ear: Tympanic membrane, external ear and ear canal normal.  Nose: Nose normal.  Mouth/Throat: Uvula is midline, oropharynx is clear and moist and mucous membranes are normal.  Eyes: EOM are normal. Pupils are equal, round, and reactive to light.  Neck: Normal range of motion. Neck supple. No tracheal deviation present.  Cardiovascular: Normal rate, regular rhythm, S1 normal, S2 normal, normal heart sounds, intact distal pulses and normal pulses.   Pulmonary/Chest: Effort normal. She has wheezes in the right upper field, the right lower field, the left upper field and the left lower field.  Abdominal: Soft.  Musculoskeletal: Normal range of motion.  3+ pitting edema BLE to anterior tib  Neurological: She is alert and oriented to person, place, and time.  Skin: Skin is warm and dry.  Psychiatric: She has a normal mood and affect. Her behavior is normal. Thought content normal.  Nursing note and vitals reviewed.   ED Course  Procedures (including critical care time) CRITICAL CARE Performed by: Ozella Rocks   Total critical care time: 40 minutes  Critical care time was exclusive of separately billable procedures and treating other patients.  Critical care was necessary to treat or prevent imminent or life-threatening deterioration.  Critical care was time spent personally by me on the following activities: development of treatment plan  with patient and/or surrogate as well as nursing, discussions with consultants, evaluation of patient's response to treatment, examination of patient, obtaining history from patient or surrogate, ordering and performing treatments and interventions, ordering and review of laboratory studies, ordering and review of radiographic studies, pulse oximetry and re-evaluation of patient's condition.  Labs Review Labs Reviewed  CBC - Abnormal; Notable for the following:    MCV 103.3 (*)    All other components within normal limits  COMPREHENSIVE METABOLIC PANEL - Abnormal; Notable for the following:    Chloride 87 (*)    CO2 42 (*)    Glucose, Bld 155 (*)    Albumin 3.2 (*)    All other components within normal limits  URINALYSIS, ROUTINE W REFLEX MICROSCOPIC (NOT AT [facility]) - Abnormal; Notable for the following:  Protein, ur 30 (*)    All other components within normal limits  URINE MICROSCOPIC-ADD ON - Abnormal; Notable for the following:    Squamous Epithelial / LPF 0-5 (*)    Bacteria, UA FEW (*)    Casts HYALINE CASTS (*)    All other components within normal limits  CULTURE, BLOOD (ROUTINE X 2)  CULTURE, BLOOD (ROUTINE X 2)  URINE CULTURE  BRAIN NATRIURETIC PEPTIDE  INFLUENZA PANEL BY PCR (TYPE A & B, H1N1)  MAGNESIUM  PHOSPHORUS  I-STAT CG4 LACTIC ACID, ED  I-STAT TROPOININ, ED  I-STAT VENOUS BLOOD GAS, ED  I-STAT CG4 LACTIC ACID, ED   Imaging Review Dg Chest Port 1 View  12/24/2015  CLINICAL DATA:  70 year old female with shortness of breath today. History of CHF. EXAM: PORTABLE CHEST 1 VIEW COMPARISON:  No priors. FINDINGS: Mild diffuse peribronchial cuffing and patchy areas of interstitial prominence, similar to prior examinations. Mild emphysema. No acute consolidative airspace disease. No pleural effusions. No definite suspicious appearing pulmonary nodules or masses. Heart size is normal. The patient is rotated to the left on today's exam, resulting in distortion of the  mediastinal contours and reduced diagnostic sensitivity and specificity for mediastinal pathology. Atherosclerotic calcifications in the thoracic aorta. IMPRESSION: 1. No definite radiographic evidence of acute cardiopulmonary disease. 2. Peribronchial cuffing and emphysematous changes, similar to prior examination, suggestive of underlying COPD. 3. Atherosclerosis. Electronically Signed   By: Vinnie Langton M.D.   On: 12/24/2015 20:24   I have personally reviewed and evaluated these images and lab results as part of my medical decision-making.   EKG Interpretation None      MDM  I have reviewed and evaluated the relevant laboratory values.I have reviewed and evaluated the relevant imaging studies.I personally evaluated and interpreted the relevant EKG.I have reviewed the relevant previous healthcare records.I have reviewed EMS Documentation.I obtained HPI from historian. Patient discussed with supervising physician  ED Course:  Assessment: 12y F hx COPD, CHF presents to ED via EMS due to SOB. Hypoxic on arrival with O2 sats in 60s per report from EMS. Put on NRB and O2 Sats 90s. Did not take lasix today, but did yesterday. On exam, pt appears lethargic and requires repeat questioning to get her attention. Initial temp 101.66F with hypoxia. No tachycardia. Normotensive. Lungs wheezing bilaterally. Heart RRR. 3+ pitting edema BLE. Given 40mg  Lasix IV. Initial lactate neg. CXR shows peribronchial cuffing and emphysematous changes. No acute infiltrate. VBG showed CO2 retention >100. CBC WNL. UA unremarkable. Put on Bipap. Given 125mg  solumedrol. Consult with medicine. Will admit to stepdown.     Disposition/Plan:  Admit Pt acknowledges and agrees with plan  Supervising Physician Courteney Julio Alm, MD   Final diagnoses:  Shortness of breath  COPD exacerbation [facility])       Shary Decamp, PA-C 12/24/15 [id number]  Courteney Lyn Mackuen, MD 12/25/15 [id number]

## 2015-12-24 NOTE — ED Notes (Signed)
Pt comes via [facility], was visiting husband at nursing home, starting having trouble breathing, stats originally in 60's, pt placed on non-rebreather with stats in 90's. Lung sounds wet, hx of CHF, noncompliant with lasix recently.

## 2015-12-24 NOTE — Telephone Encounter (Signed)
She was last seen 11/2014 She needs office visit with me or TP She can find out alternatives to dulera in the meantime -Symbicort, Advair or Brio

## 2015-12-24 NOTE — Telephone Encounter (Signed)
Pt was scheduled to see TP today at 2:15 but does not show she has arrived yet. LMTCB x1

## 2015-12-25 ENCOUNTER — Encounter (HOSPITAL_COMMUNITY): Payer: Self-pay | Admitting: Internal Medicine

## 2015-12-25 DIAGNOSIS — Z720 Tobacco use: Secondary | ICD-10-CM | POA: Diagnosis present

## 2015-12-25 DIAGNOSIS — J9620 Acute and chronic respiratory failure, unspecified whether with hypoxia or hypercapnia: Secondary | ICD-10-CM | POA: Diagnosis present

## 2015-12-25 DIAGNOSIS — J9621 Acute and chronic respiratory failure with hypoxia: Secondary | ICD-10-CM | POA: Diagnosis present

## 2015-12-25 DIAGNOSIS — Z87891 Personal history of nicotine dependence: Secondary | ICD-10-CM | POA: Diagnosis present

## 2015-12-25 LAB — COMPREHENSIVE METABOLIC PANEL
ALK PHOS: 62 U/L (ref 38–126)
ALT: 25 U/L (ref 14–54)
ANION GAP: 12 (ref 5–15)
AST: 26 U/L (ref 15–41)
Albumin: 3 g/dL — ABNORMAL LOW (ref 3.5–5.0)
BUN: 9 mg/dL (ref 6–20)
CALCIUM: 8.9 mg/dL (ref 8.9–10.3)
CO2: 45 mmol/L — AB (ref 22–32)
CREATININE: 0.72 mg/dL (ref 0.44–1.00)
Chloride: 81 mmol/L — ABNORMAL LOW (ref 101–111)
GFR calc Af Amer: >60 mL/min (ref >60)
Glucose, Bld: 169 mg/dL — ABNORMAL HIGH (ref 65–99)
Potassium: 4.4 mmol/L (ref 3.5–5.1)
SODIUM: 138 mmol/L (ref 135–145)
TOTAL PROTEIN: 6.2 g/dL — AB (ref 6.5–8.1)
Total Bilirubin: 0.4 mg/dL (ref 0.3–1.2)

## 2015-12-25 LAB — CBC WITH DIFFERENTIAL/PLATELET
BASOS ABS: 0 10*3/uL (ref 0.0–0.1)
BASOS PCT: 0 %
EOS ABS: 0 10*3/uL (ref 0.0–0.7)
Eosinophils Relative: 0 %
HEMATOCRIT: 42.4 % (ref 36.0–46.0)
HEMOGLOBIN: 12.9 g/dL (ref 12.0–15.0)
Lymphocytes Relative: 4 %
Lymphs Abs: 0.1 10*3/uL — ABNORMAL LOW (ref 0.7–4.0)
MCH: 31.2 pg (ref 26.0–34.0)
MCHC: 30.4 g/dL (ref 30.0–36.0)
MCV: 102.7 fL — ABNORMAL HIGH (ref 78.0–100.0)
MONOS PCT: 1 %
Monocytes Absolute: 0.1 10*3/uL (ref 0.1–1.0)
NEUTROS ABS: 3.5 10*3/uL (ref 1.7–7.7)
NEUTROS PCT: 95 %
Platelets: 203 10*3/uL (ref 150–400)
RBC: 4.13 MIL/uL (ref 3.87–5.11)
RDW: 14.4 % (ref 11.5–15.5)
WBC: 3.7 10*3/uL — AB (ref 4.0–10.5)

## 2015-12-25 LAB — I-STAT CG4 LACTIC ACID, ED: Lactic Acid, Venous: 2.05 mmol/L (ref 0.5–2.0)

## 2015-12-25 LAB — INFLUENZA PANEL BY PCR (TYPE A & B)
H1N1 flu by pcr: NOT DETECTED
INFLAPCR: NEGATIVE
Influenza B By PCR: NEGATIVE

## 2015-12-25 LAB — MRSA PCR SCREENING: MRSA BY PCR: NEGATIVE

## 2015-12-25 MED ORDER — ALBUTEROL SULFATE (2.5 MG/3ML) 0.083% IN NEBU
2.5000 mg | INHALATION_SOLUTION | Freq: Four times a day (QID) | RESPIRATORY_TRACT | Status: DC
  Administered 2015-12-25 – 2015-12-26 (×6): 2.5 mg via RESPIRATORY_TRACT
  Filled 2015-12-25 (×6): qty 3

## 2015-12-25 MED ORDER — LEVOFLOXACIN IN D5W 500 MG/100ML IV SOLN
500.0000 mg | Freq: Once | INTRAVENOUS | Status: AC
  Administered 2015-12-25: 500 mg via INTRAVENOUS
  Filled 2015-12-25: qty 100

## 2015-12-25 MED ORDER — MAGNESIUM SULFATE 2 GM/50ML IV SOLN
2.0000 g | Freq: Once | INTRAVENOUS | Status: AC
  Administered 2015-12-25: 2 g via INTRAVENOUS
  Filled 2015-12-25: qty 50

## 2015-12-25 MED ORDER — ALPRAZOLAM 0.25 MG PO TABS
0.2500 mg | ORAL_TABLET | Freq: Two times a day (BID) | ORAL | Status: DC
  Administered 2015-12-25 – 2015-12-26 (×4): 0.25 mg via ORAL
  Filled 2015-12-25 (×4): qty 1

## 2015-12-25 MED ORDER — LEVOFLOXACIN IN D5W 250 MG/50ML IV SOLN
250.0000 mg | INTRAVENOUS | Status: DC
  Filled 2015-12-25: qty 50

## 2015-12-25 MED ORDER — METHYLPREDNISOLONE SODIUM SUCC 40 MG IJ SOLR
40.0000 mg | Freq: Four times a day (QID) | INTRAMUSCULAR | Status: DC
  Administered 2015-12-25 – 2015-12-26 (×5): 40 mg via INTRAVENOUS
  Filled 2015-12-25 (×5): qty 1

## 2015-12-25 MED ORDER — LORATADINE 10 MG PO TABS
10.0000 mg | ORAL_TABLET | Freq: Every day | ORAL | Status: DC
  Administered 2015-12-25 – 2015-12-27 (×3): 10 mg via ORAL
  Filled 2015-12-25 (×3): qty 1

## 2015-12-25 MED ORDER — TIOTROPIUM BROMIDE MONOHYDRATE 18 MCG IN CAPS
18.0000 ug | ORAL_CAPSULE | Freq: Every day | RESPIRATORY_TRACT | Status: DC
  Administered 2015-12-25 – 2015-12-27 (×3): 18 ug via RESPIRATORY_TRACT
  Filled 2015-12-25: qty 5

## 2015-12-25 MED ORDER — POTASSIUM CHLORIDE ER 10 MEQ PO TBCR
10.0000 meq | EXTENDED_RELEASE_TABLET | Freq: Two times a day (BID) | ORAL | Status: DC
Start: 2015-12-25 — End: 2015-12-27
  Administered 2015-12-25 – 2015-12-27 (×5): 10 meq via ORAL
  Filled 2015-12-25 (×12): qty 1

## 2015-12-25 MED ORDER — CITALOPRAM HYDROBROMIDE 10 MG PO TABS
10.0000 mg | ORAL_TABLET | Freq: Every day | ORAL | Status: DC
  Administered 2015-12-25 – 2015-12-27 (×3): 10 mg via ORAL
  Filled 2015-12-25 (×3): qty 1

## 2015-12-25 MED ORDER — LEVOTHYROXINE SODIUM 50 MCG PO TABS
50.0000 ug | ORAL_TABLET | Freq: Every day | ORAL | Status: DC
  Administered 2015-12-25 – 2015-12-27 (×3): 50 ug via ORAL
  Filled 2015-12-25 (×3): qty 1

## 2015-12-25 MED ORDER — FUROSEMIDE 40 MG PO TABS
40.0000 mg | ORAL_TABLET | Freq: Every day | ORAL | Status: DC
  Administered 2015-12-25 – 2015-12-27 (×3): 40 mg via ORAL
  Filled 2015-12-25 (×3): qty 1

## 2015-12-25 MED ORDER — MAGNESIUM SULFATE 4 GM/100ML IV SOLN
4.0000 g | Freq: Once | INTRAVENOUS | Status: AC
  Administered 2015-12-25: 4 g via INTRAVENOUS
  Filled 2015-12-25: qty 100

## 2015-12-25 MED ORDER — ENOXAPARIN SODIUM 30 MG/0.3ML ~~LOC~~ SOLN
20.0000 mg | SUBCUTANEOUS | Status: DC
  Administered 2015-12-25 – 2015-12-27 (×3): 20 mg via SUBCUTANEOUS
  Filled 2015-12-25: qty 0.3
  Filled 2015-12-25: qty 0.2
  Filled 2015-12-25: qty 0.3
  Filled 2015-12-25: qty 0.2
  Filled 2015-12-25: qty 0.3
  Filled 2015-12-25: qty 0.2

## 2015-12-25 MED ORDER — ALBUTEROL SULFATE (2.5 MG/3ML) 0.083% IN NEBU: 2.5000 mg | INHALATION_SOLUTION | Freq: Four times a day (QID) | RESPIRATORY_TRACT | Status: DC | PRN

## 2015-12-25 MED ORDER — DOXYCYCLINE HYCLATE 100 MG IV SOLR
100.0000 mg | Freq: Two times a day (BID) | INTRAVENOUS | Status: DC
  Administered 2015-12-25 – 2015-12-26 (×3): 100 mg via INTRAVENOUS
  Filled 2015-12-25 (×4): qty 100

## 2015-12-25 NOTE — Progress Notes (Signed)
Pt taken off BiPap at this time per her request. Placed on 4L [place] sats remain 96-98%. Per patient, she wears 3-4L oxygen continuously at home. Dr. Tennis Must notified of this change, and agrees, as long as patient is not short of breath, confused, or dropping O2 sats. Will continue to monitor

## 2015-12-25 NOTE — Progress Notes (Signed)
Lab tech called and reported positive blood culture result. Md notified.  Ferdinand Lango, RN

## 2015-12-25 NOTE — Progress Notes (Signed)
[facility] PROGRESS NOTE    Progress Note   LAGENA HABA [id number] DOB: [birth year] DOA: 01/20/2016 PCP: [name], MD   Brief Narrative:   Jasmine [name] is an 70 y.o. female past medical history of COPD oxygen dependent  Assessment/Plan:   Acute on chronic respiratory failure ([place]) due to   COPD (chronic obstructive pulmonary disease) ([place]): Patient initially require BiPAP, now on nasal cannula. He's had a mild rise in his lactic acid is likely due to hypoxia, she is not septic. Agree with IV steroids, empiric antibiotics and inhalers. At long-acting Anticholinergic agent. Cont. nasal cannulae she did try to keep oxygen saturations above 88%.  Depression with anxiety: Continue Celexa original and alprazolam as needed.  Essential hypertension: She is at goal. Seems to be stable continue current regimen.  Hypothyroidism following radioiodine therapy Continue Synthroid.  Hypomagnesemia: Due to diuretic use.  Tobacco abuse disorder    DVT Prophylaxis - Lovenox ordered.  Family Communication: none Disposition Plan: Home 1-2 days Code Status:     Code Status Orders        Start     Ordered   12/25/15 0104  Full code   Continuous     12/25/15 0103    Code Status History    Date Active Date Inactive Code Status Order ID Comments User Context   06/20/2014  4:07 AM 06/25/2014 10:38 PM Full Code [id number]  Toy Baker, MD Inpatient   11/09/2011 10:34 PM 11/12/2011  7:49 PM Full Code [id number]  Adriane Lilyan Punt, RN Inpatient        IV Access:    Peripheral IV   Procedures and diagnostic studies:   Dg Chest Port 1 View  01/20/2016  CLINICAL DATA:  70 year old female with shortness of breath today. History of CHF. EXAM: PORTABLE CHEST 1 VIEW COMPARISON:  No priors. FINDINGS: Mild diffuse peribronchial cuffing and patchy areas of interstitial prominence, similar to prior examinations. Mild emphysema. No acute consolidative airspace disease.  No pleural effusions. No definite suspicious appearing pulmonary nodules or masses. Heart size is normal. The patient is rotated to the left on today's exam, resulting in distortion of the mediastinal contours and reduced diagnostic sensitivity and specificity for mediastinal pathology. Atherosclerotic calcifications in the thoracic aorta. IMPRESSION: 1. No definite radiographic evidence of acute cardiopulmonary disease. 2. Peribronchial cuffing and emphysematous changes, similar to prior examination, suggestive of underlying COPD. 3. Atherosclerosis. Electronically Signed   By: Vinnie Langton M.D.   On: 2016/01/20 20:24     Medical Consultants:    None.  Anti-Infectives:   Anti-infectives    Start     Dose/Rate Route Frequency Ordered Stop   12/25/15 2200  Levofloxacin (LEVAQUIN) IVPB 250 mg  Status:  Discontinued     250 mg 50 mL/hr over 60 Minutes Intravenous Every 24 hours 12/25/15 0121 12/25/15 0910   12/25/15 0915  doxycycline (VIBRAMYCIN) 100 mg in dextrose 5 % 250 mL IVPB     100 mg 125 mL/hr over 120 Minutes Intravenous Every 12 hours 12/25/15 0910     12/25/15 0130  levofloxacin (LEVAQUIN) IVPB 500 mg     500 mg 100 mL/hr over 60 Minutes Intravenous  Once 12/25/15 0103 12/25/15 0305      Subjective:    [name] she relates her breathing is improved but not back to baseline. She continues to have a persistent cough.  Objective:    Filed Vitals:   January 20, 2016 2330 12/25/15 0036 12/25/15 0500 12/25/15 0600  BP: 109/66  92/57 104/62  Pulse: 74  74 77  Temp:  98.6 F (37 C) 98.1 F (36.7 C)   TempSrc:  Axillary Oral   Resp: 17  14 16   Height:  5' (1.524 m)    Weight:  37.649 kg (83 lb) 39.69 kg (87 lb 8 oz)   SpO2: 99% 99% 98% 99%    Intake/Output Summary (Last 24 hours) at 12/25/15 0915 Last data filed at 12/25/15 0132  Gross per 24 hour  Intake      0 ml  Output    600 ml  Net   -600 ml   Filed Weights   12/24/15 1948 12/25/15 0036 12/25/15 0500    Weight: 41.731 kg (92 lb) 37.649 kg (83 lb) 39.69 kg (87 lb 8 oz)    Exam: Gen:  NAD Cardiovascular:  RRR. Chest and lungs:   Good air movement with expiratory wheezes. Abdomen:  Abdomen soft, NT/ND, + BS Extremities:  No edema   Data Reviewed:    Labs: Basic Metabolic Panel:  Recent Labs Lab 12/24/15 2000 12/24/15 2330 12/25/15 0428  NA 139  --  138  K 4.1  --  4.4  CL 87*  --  81*  CO2 42*  --  45*  GLUCOSE 155*  --  169*  BUN 7  --  9  CREATININE 0.74  --  0.72  CALCIUM 9.2  --  8.9  MG  --  1.5*  --   PHOS  --  3.1  --    GFR Estimated Creatinine Clearance: 40.4 mL/min (by C-G formula based on Cr of 0.72). Liver Function Tests:  Recent Labs Lab 12/24/15 2000 12/25/15 0428  AST 27 26  ALT 27 25  ALKPHOS 65 62  BILITOT 0.4 0.4  PROT 6.8 6.2*  ALBUMIN 3.2* 3.0*   No results for input(s): LIPASE, AMYLASE in the last 168 hours. No results for input(s): AMMONIA in the last 168 hours. Coagulation profile No results for input(s): INR, PROTIME in the last 168 hours.  CBC:  Recent Labs Lab 12/24/15 2026 12/25/15 0428  WBC 5.1 3.7*  NEUTROABS  --  3.5  HGB 12.5 12.9  HCT 40.8 42.4  MCV 103.3* 102.7*  PLT 194 203   Cardiac Enzymes: No results for input(s): CKTOTAL, CKMB, CKMBINDEX, TROPONINI in the last 168 hours. BNP (last 3 results) No results for input(s): PROBNP in the last 8760 hours. CBG: No results for input(s): GLUCAP in the last 168 hours. D-Dimer: No results for input(s): DDIMER in the last 72 hours. Hgb A1c: No results for input(s): HGBA1C in the last 72 hours. Lipid Profile: No results for input(s): CHOL, HDL, LDLCALC, TRIG, CHOLHDL, LDLDIRECT in the last 72 hours. Thyroid function studies: No results for input(s): TSH, T4TOTAL, T3FREE, THYROIDAB in the last 72 hours.  Invalid input(s): FREET3 Anemia work up: No results for input(s): VITAMINB12, FOLATE, FERRITIN, TIBC, IRON, RETICCTPCT in the last 72 hours. Sepsis  Labs:  Recent Labs Lab 12/24/15 2026 12/24/15 2040 12/24/15 2357 12/25/15 0428  WBC 5.1  --   --  3.7*  LATICACIDVEN  --  1.59 2.05*  --    Microbiology Recent Results (from the past 240 hour(s))  MRSA PCR Screening     Status: None   Collection Time: 12/25/15 12:41 AM  Result Value Ref Range Status   MRSA by PCR NEGATIVE NEGATIVE Final    Comment:        The GeneXpert MRSA Assay (FDA approved  for NASAL specimens only), is one component of a comprehensive MRSA colonization surveillance program. It is not intended to diagnose MRSA infection nor to guide or monitor treatment for MRSA infections.      Medications:   . ALPRAZolam  0.25 mg Oral BID  . citalopram  10 mg Oral Daily  . doxycycline (VIBRAMYCIN) IV  100 mg Intravenous Q12H  . enoxaparin (LOVENOX) injection  20 mg Subcutaneous Q24H  . furosemide  40 mg Oral Daily  . levothyroxine  50 mcg Oral QAC breakfast  . loratadine  10 mg Oral Daily  . methylPREDNISolone (SOLU-MEDROL) injection  40 mg Intravenous Q6H  . potassium chloride  10 mEq Oral BID  . tiotropium  18 mcg Inhalation Daily   Continuous Infusions:   Time spent: 25 min   LOS: 1 day   Charlynne Cousins  [facility] Pager [phone]  *Please refer to [email or web address], password [email or web address] to get updated schedule on who will round on this patient, as hospitalists switch teams weekly. If 7PM-7AM, please contact night-coverage at [email or web address], password [email or web address] for any overnight needs.  12/25/2015, 9:15 AM

## 2015-12-25 NOTE — Progress Notes (Signed)
RT assisted with transport to [facility]. No complications. Report given to [place], RT

## 2015-12-25 NOTE — H&P (Signed)
Jasmine [name] History and Physical  Jasmine [name] [id number] DOB: 25-Jul-1946 DOA: 12/24/2015  Referring physician: Shary Decamp, PA-C PCP: [name], MD   Chief Complaint: Shortness of breath.  HPI: Jasmine [name] is a 70 y.o. female with a past medical history as below who was brought to the emergency department via EMS after she developed dyspnea while she was visiting her husband at the nursing home.  EMS reports that her initial oxygen saturations were in the 60s and with a nonrebreather mask, her saturations went up to the 90s. In the ER, she was given supplemental oxygen, Solu-Medrol, bronchodilators and was started on BiPAP ventilation.  When seen, the patient was in no acute distress, denied any pain or discomfort. She stated that she was feeling better, but was feeling tired.   Review of Systems:  Unable to fully obtain since the patient was sleeping and using BiPAP ventilation.  Past Medical History  Diagnosis Date  . ALLERGIC RHINITIS 05/01/2007  . ASTHMA 05/01/2007  . BACK PAIN 10/10/2008  . DEPRESSION 07/21/2008  . FATIGUE 10/10/2010  . HYPERTENSION 05/01/2007  . HYPERTHYROIDISM 11/23/2007    Pt endorses having had Graves disease, possibly radioactive iodine x1, but no thyroidectomy  . HYPOTHYROIDISM 08/23/2009  . LUMBAR RADICULOPATHY, RIGHT 08/25/2008  . OSTEOPOROSIS 05/01/2007  . SHOULDER PAIN, LEFT 08/23/2009  . SINUSITIS, CHRONIC 10/10/2010  . SINUSITIS- ACUTE-NOS 10/10/2008  . H/O blood clots     left leg 1960's  . CHF (congestive heart failure) [facility])    Past Surgical History  Procedure Laterality Date  . Appendectomy    . Ectopic pregnancy surgery    . Post fallopian tube removal right side    . History of multiple dialations and curettages and miscarriages, unfortunately never carrying a child to term    . Electrocardiogram  06/20/2006  . Tubal ligation     Social History:  reports that she has been smoking Cigarettes.  She has a 2 pack-year smoking  history. She has never used smokeless tobacco. She reports that she drinks about 1.2 oz of alcohol per week. She reports that she uses illicit drugs (Cocaine) about twice per week.  Allergies  Allergen Reactions  . Alendronate Sodium Other (See Comments)    Family History  Problem Relation Age of Onset  . Alcohol abuse Brother   . Diabetes Brother   . Hypertension Other   . Lung cancer Father   . Stroke Mother   . Asthma      maternal aunts  . Thyroid disease Mother     Prior to Admission medications   Medication Sig Start Date End Date Taking? Authorizing Provider  albuterol (PROVENTIL) (2.5 MG/3ML) 0.083% nebulizer solution Take 3 mLs (2.5 mg total) by nebulization every 4 (four) hours as needed for wheezing. 06/25/14  Yes Theodis Blaze, MD  citalopram (CELEXA) 10 MG tablet Take 1 tablet (10 mg total) by mouth daily. 10/12/15  Yes Biagio Borg, MD  ALPRAZolam Duanne Moron) 0.25 MG tablet TAKE 1 TABLET BY MOUTH TWICE DAILY 09/25/15   Biagio Borg, MD  cetirizine (ZYRTEC) 10 MG tablet TAKE 1 TABLET (10 MG TOTAL) BY MOUTH DAILY. Patient not taking: Reported on 10/09/2015 06/04/15   Rigoberto Noel, MD  fluticasone furoate-vilanterol (BREO ELLIPTA) 200-25 MCG/INH AEPB Inhale 1 puff into the lungs daily. 12/21/15   Rigoberto Noel, MD  furosemide (LASIX) 40 MG tablet Take 1 tablet (40 mg total) by mouth daily. 10/12/15   Biagio Borg,  MD  levothyroxine (SYNTHROID, LEVOTHROID) 50 MCG tablet Take 1 tablet (50 mcg total) by mouth daily. 10/12/15   Biagio Borg, MD  mometasone-formoterol (DULERA) 200-5 MCG/ACT AERO INHALE 2 PUFFS INTO THE LUNGS 2 (TWO) TIMES DAILY. 12/19/15   Rigoberto Noel, MD  potassium chloride (K-DUR) 10 MEQ tablet Take 1 tablet (10 mEq total) by mouth 2 (two) times daily. 10/12/15   Biagio Borg, MD  tiotropium (SPIRIVA HANDIHALER) 18 MCG inhalation capsule Place 1 capsule (18 mcg total) into inhaler and inhale daily. 08/22/15   Rigoberto Noel, MD  triamcinolone cream (KENALOG) 0.1 % Apply  1 application topically 2 (two) times daily as needed ("break out on arms").    Historical Provider, MD   Physical Exam: Filed Vitals:   12/24/15 2245 12/24/15 2300 12/24/15 2330 12/25/15 0036  BP: 117/69 117/66 109/66   Pulse: 74 73 74   Temp:    98.6 F (37 C)  TempSrc:    Axillary  Resp: 17 18 17    Height:      Weight:    37.649 kg (83 lb)  SpO2: 100% 100% 99% 99%    Wt Readings from Last 3 Encounters:  12/25/15 37.649 kg (83 lb)  10/09/15 41.731 kg (92 lb)  01/11/15 41.005 kg (90 lb 6.4 oz)    General:  Appears calm and comfortable Eyes: PERRL, normal lids, irises & conjunctiva ENT: grossly normal hearing, BiPAP mask on, lips & tongue Neck: no LAD, masses or thyromegaly Cardiovascular: S1 and S2, RRR.  1+ LE edema. Telemetry: Sinus rhythm at 72 Respiratory: Mild wheezing and rhonchi bilaterally. No accessory muscle use. Abdomen: soft, ntnd Skin: no rash or induration seen on limited exam Musculoskeletal: grossly normal tone BUE/BLE Psychiatric: grossly normal mood and affect, speech fluent and appropriate Neurologic: Sleeping, but arousable. Awake, alert, oriented 3, falls back to sleep, Moves all extremities.           Labs on Admission:  Basic Metabolic Panel:  Recent Labs Lab 12/24/15 2000 12/24/15 2330  NA 139  --   K 4.1  --   CL 87*  --   CO2 42*  --   GLUCOSE 155*  --   BUN 7  --   CREATININE 0.74  --   CALCIUM 9.2  --   MG  --  1.5*  PHOS  --  3.1   Liver Function Tests:  Recent Labs Lab 12/24/15 2000  AST 27  ALT 27  ALKPHOS 65  BILITOT 0.4  PROT 6.8  ALBUMIN 3.2*   CBC:  Recent Labs Lab 12/24/15 2026  WBC 5.1  HGB 12.5  HCT 40.8  MCV 103.3*  PLT 194    BNP (last 3 results)  Recent Labs  12/24/15 2000  BNP 26.7    Radiological Exams on Admission: Dg Chest Port 1 View  12/24/2015  CLINICAL DATA:  70 year old female with shortness of breath today. History of CHF. EXAM: PORTABLE CHEST 1 VIEW COMPARISON:  No priors.  FINDINGS: Mild diffuse peribronchial cuffing and patchy areas of interstitial prominence, similar to prior examinations. Mild emphysema. No acute consolidative airspace disease. No pleural effusions. No definite suspicious appearing pulmonary nodules or masses. Heart size is normal. The patient is rotated to the left on today's exam, resulting in distortion of the mediastinal contours and reduced diagnostic sensitivity and specificity for mediastinal pathology. Atherosclerotic calcifications in the thoracic aorta. IMPRESSION: 1. No definite radiographic evidence of acute cardiopulmonary disease. 2. Peribronchial cuffing and emphysematous changes,  similar to prior examination, suggestive of underlying COPD. 3. Atherosclerosis. Electronically Signed   By: Vinnie Langton M.D.   On: 12/24/2015 20:24  Echocardiogram: 09/01/215  ------------------------------------------------------------------- LV EF: 55% -  60%  ------------------------------------------------------------------- Indications:   Cor Pulmone - acute 415.0.  ------------------------------------------------------------------- History:  PMH:  Chronic obstructive pulmonary disease. Risk factors: Current tobacco use. Hypertension.  ------------------------------------------------------------------- Study Conclusions  - Left ventricle: The cavity size was normal. Wall thickness was normal. Systolic function was normal. The estimated ejection fraction was in the range of 55% to 60%. Wall motion was normal; there were no regional wall motion abnormalities. Doppler parameters are consistent with abnormal left ventricular relaxation (grade 1 diastolic dysfunction). The E/e&' ratio is between 8-15, suggesting normal LV filling pressure. - Aortic valve: Structurally normal valve. Trileaflet. There was trace to mild regurgitation. - Left atrium: The atrium was normal in size. - Right atrium: The atrium was normal in  size. - Tricuspid valve: There was no significant regurgitation.  Impressions:  - Compared to the prior echo in 2013, there are no signfiicant changes. Unable to calculate RVSP due to inadequate TR envelope.  EKG: Independently reviewed. Vent. rate 90 BPM PR interval 168 ms QRS duration 82 ms QT/QTc 341/417 ms P-R-T axes 78 82 69 Sinus tachycardia Multiform ventricular premature complexes Biatrial enlargement Anterior infarct, old Baseline wander in lead(s) V1  Assessment/Plan Principal Problem:   Acute on chronic respiratory failure (HCC)  COPD (chronic obstructive pulmonary disease) (HCC) Admit to the stepdown for close monitoring. Continue cardiac telemetry and continuous pulse ox measurement. Continue supplemental oxygen. Continue BiPAP ventilation. Continue bronchodilators. Continue methylprednisolone. Check a sputum Gram stain culture and sensitivity. Start Levaquin 500 mg IVP every 24 hours.  Active Problems:   Depression with anxiety Continue Celexa 10 mg by mouth daily and alprazolam 0.25 mg by mouth twice a day as needed.    Essential hypertension Continue low-sodium diet. Continue furosemide 40 mg by mouth daily. Blood pressure monitoring periodically.      Hypothyroidism following radioiodine therapy Continue levothyroxine 50 g by mouth daily. Check TSH periodically.    Hypomagnesemia Likely due to the use of loop diuretics. Continue replacement with magnesium sulfate.    Tobacco abuse disorder Nicotine replacement therapy as needed if the patient agrees.    Code Status: Full Code. DVT Prophylaxis: Lovenox SQ. Family Communication: Disposition Plan: Admit to a stepdown to continue BiPAP ventilation.  Time spent: About 70 minutes were spent in the process of his admission.  Reubin Milan, MD Jasmine [name] Pager [phone].

## 2015-12-26 DIAGNOSIS — I10 Essential (primary) hypertension: Secondary | ICD-10-CM

## 2015-12-26 DIAGNOSIS — J449 Chronic obstructive pulmonary disease, unspecified: Secondary | ICD-10-CM

## 2015-12-26 DIAGNOSIS — E890 Postprocedural hypothyroidism: Secondary | ICD-10-CM

## 2015-12-26 DIAGNOSIS — F418 Other specified anxiety disorders: Secondary | ICD-10-CM

## 2015-12-26 LAB — CBC WITH DIFFERENTIAL/PLATELET
BASOS PCT: 0 %
Basophils Absolute: 0 10*3/uL (ref 0.0–0.1)
EOS ABS: 0 10*3/uL (ref 0.0–0.7)
EOS PCT: 0 %
HCT: 39.9 % (ref 36.0–46.0)
HEMOGLOBIN: 12.2 g/dL (ref 12.0–15.0)
LYMPHS ABS: 0.3 10*3/uL — AB (ref 0.7–4.0)
Lymphocytes Relative: 6 %
MCH: 30.9 pg (ref 26.0–34.0)
MCHC: 30.6 g/dL (ref 30.0–36.0)
MCV: 101 fL — ABNORMAL HIGH (ref 78.0–100.0)
MONOS PCT: 4 %
Monocytes Absolute: 0.2 10*3/uL (ref 0.1–1.0)
NEUTROS PCT: 90 %
Neutro Abs: 5.1 10*3/uL (ref 1.7–7.7)
PLATELETS: 205 10*3/uL (ref 150–400)
RBC: 3.95 MIL/uL (ref 3.87–5.11)
RDW: 14 % (ref 11.5–15.5)
WBC: 5.7 10*3/uL (ref 4.0–10.5)

## 2015-12-26 LAB — COMPREHENSIVE METABOLIC PANEL
ALT: 20 U/L (ref 14–54)
ANION GAP: 10 (ref 5–15)
AST: 19 U/L (ref 15–41)
Albumin: 2.9 g/dL — ABNORMAL LOW (ref 3.5–5.0)
Alkaline Phosphatase: 58 U/L (ref 38–126)
BUN: 13 mg/dL (ref 6–20)
CALCIUM: 8.6 mg/dL — AB (ref 8.9–10.3)
CHLORIDE: 83 mmol/L — AB (ref 101–111)
CO2: 42 mmol/L — AB (ref 22–32)
CREATININE: 0.73 mg/dL (ref 0.44–1.00)
GFR calc non Af Amer: >60 mL/min (ref >60)
Glucose, Bld: 167 mg/dL — ABNORMAL HIGH (ref 65–99)
Potassium: 4.6 mmol/L (ref 3.5–5.1)
SODIUM: 135 mmol/L (ref 135–145)
Total Bilirubin: 0.3 mg/dL (ref 0.3–1.2)
Total Protein: 5.7 g/dL — ABNORMAL LOW (ref 6.5–8.1)

## 2015-12-26 LAB — URINE CULTURE

## 2015-12-26 LAB — MAGNESIUM: MAGNESIUM: 2.2 mg/dL (ref 1.7–2.4)

## 2015-12-26 MED ORDER — ALBUTEROL SULFATE (2.5 MG/3ML) 0.083% IN NEBU: 2.5000 mg | INHALATION_SOLUTION | RESPIRATORY_TRACT | Status: DC | PRN

## 2015-12-26 MED ORDER — PREDNISONE 10 MG PO TABS: 10.0000 mg | ORAL_TABLET | Freq: Every day | ORAL | Status: DC

## 2015-12-26 MED ORDER — IPRATROPIUM-ALBUTEROL 0.5-2.5 (3) MG/3ML IN SOLN
3.0000 mL | Freq: Three times a day (TID) | RESPIRATORY_TRACT | Status: DC
  Administered 2015-12-26 – 2015-12-27 (×2): 3 mL via RESPIRATORY_TRACT
  Filled 2015-12-26 (×2): qty 3

## 2015-12-26 MED ORDER — DOXYCYCLINE HYCLATE 100 MG PO TABS
100.0000 mg | ORAL_TABLET | Freq: Two times a day (BID) | ORAL | Status: DC
  Administered 2015-12-26 – 2015-12-27 (×2): 100 mg via ORAL
  Filled 2015-12-26 (×2): qty 1

## 2015-12-26 MED ORDER — ALPRAZOLAM 0.5 MG PO TABS
0.5000 mg | ORAL_TABLET | Freq: Three times a day (TID) | ORAL | Status: DC | PRN
  Administered 2015-12-26 – 2015-12-27 (×2): 0.5 mg via ORAL
  Filled 2015-12-26 (×2): qty 1

## 2015-12-26 NOTE — Plan of Care (Addendum)
Problem: Education: Goal: Knowledge of [facility] Education information/materials will improve Outcome: Progressing   Problem: Health Behavior/Discharge Planning: Goal: Ability to manage health-related needs will improve Outcome Progressing   Problem: Fluid Volume: Goal: Ability to maintain a balanced intake and output will improve Outcome: Progressing

## 2015-12-26 NOTE — Progress Notes (Signed)
   12/26/15 1559  Clinical Encounter Type  Visited With Patient not available;Health care provider  Visit Type Initial  Referral From Nurse   Chaplain responded to a request to visit with the patient to offer prayer and emotional support as she found out her husband died this morning. According to the RN, the patient is currently sounding sleeping due to some medication. Chaplain will encourage tonight's on-call chaplain to visit (pending his availability), and if we don't get to it tonight, we'll try to swing by in the morning. Chaplain services available as needed.   Jeri Lager, Chaplain 12/26/2015 4:01 PM

## 2015-12-26 NOTE — Progress Notes (Signed)
[facility] PROGRESS NOTE    Progress Note   BRIEANNE LAWE [id number] DOB: September 21, 1946 DOA: 01-09-2016 PCP: [name], MD   Brief Narrative:   Jasmine [name] is an 70 y.o. female past medical history of COPD oxygen dependent  Assessment/Plan:   Acute on chronic respiratory failure ([place]) due to   COPD (chronic obstructive pulmonary disease) ([place]): Patient initially require BiPAP, now on nasal cannula. He's had a mild rise in his lactic acid is likely due to hypoxia, she is not septic. Physician IV steroids and antibiotics to an oral regimen. She is moving more air no wheezing auscultation. At long-acting Anticholinergic agent. Ambulate patient check saturations.  Depression with anxiety: Continue Celexa original and alprazolam as needed.  Essential hypertension: She is at goal. Seems to be stable continue current regimen.  Hypothyroidism following radioiodine therapy Continue Synthroid.  Hypomagnesemia: Due to diuretic use.  Tobacco abuse disorder    DVT Prophylaxis - Lovenox ordered.  Family Communication: none Disposition Plan: Home in am Code Status:     Code Status Orders        Start     Ordered   12/25/15 0104  Full code   Continuous     12/25/15 0103    Code Status History    Date Active Date Inactive Code Status Order ID Comments User Context   06/20/2014  4:07 AM 06/25/2014 10:38 PM Full Code [id number]  Toy Baker, MD Inpatient   11/09/2011 10:34 PM 11/12/2011  7:49 PM Full Code [id number]  Adriane Lilyan Punt, RN Inpatient        IV Access:    Peripheral IV   Procedures and diagnostic studies:   Dg Chest Port 1 View  01-09-2016  CLINICAL DATA:  70 year old female with shortness of breath today. History of CHF. EXAM: PORTABLE CHEST 1 VIEW COMPARISON:  No priors. FINDINGS: Mild diffuse peribronchial cuffing and patchy areas of interstitial prominence, similar to prior examinations. Mild emphysema. No acute consolidative  airspace disease. No pleural effusions. No definite suspicious appearing pulmonary nodules or masses. Heart size is normal. The patient is rotated to the left on today's exam, resulting in distortion of the mediastinal contours and reduced diagnostic sensitivity and specificity for mediastinal pathology. Atherosclerotic calcifications in the thoracic aorta. IMPRESSION: 1. No definite radiographic evidence of acute cardiopulmonary disease. 2. Peribronchial cuffing and emphysematous changes, similar to prior examination, suggestive of underlying COPD. 3. Atherosclerosis. Electronically Signed   By: Vinnie Langton M.D.   On: Jan 09, 2016 20:24     Medical Consultants:    None.  Anti-Infectives:   Anti-infectives    Start     Dose/Rate Route Frequency Ordered Stop   12/25/15 2200  Levofloxacin (LEVAQUIN) IVPB 250 mg  Status:  Discontinued     250 mg 50 mL/hr over 60 Minutes Intravenous Every 24 hours 12/25/15 0121 12/25/15 0910   12/25/15 1000  doxycycline (VIBRAMYCIN) 100 mg in dextrose 5 % 250 mL IVPB     100 mg 125 mL/hr over 120 Minutes Intravenous Every 12 hours 12/25/15 0910     12/25/15 0130  levofloxacin (LEVAQUIN) IVPB 500 mg     500 mg 100 mL/hr over 60 Minutes Intravenous  Once 12/25/15 0103 12/25/15 0305      Subjective:    [name] she relates her breathing is back to baseline.  Objective:    Filed Vitals:   12/26/15 0259 12/26/15 0400 12/26/15 0500 12/26/15 0818  BP:  101/64  106/57  Pulse:  78  68  Temp:  99.1 F (37.3 C)  98.2 F (36.8 C)  TempSrc:  Oral  Oral  Resp:  15  17  Height:      Weight:   40.506 kg (89 lb 4.8 oz)   SpO2: 97% 95%  100%    Intake/Output Summary (Last 24 hours) at 12/26/15 0921 Last data filed at 12/26/15 0450  Gross per 24 hour  Intake    370 ml  Output   1600 ml  Net  -1230 ml   Filed Weights   12/25/15 0036 12/25/15 0500 12/26/15 0500  Weight: 37.649 kg (83 lb) 39.69 kg (87 lb 8 oz) 40.506 kg (89 lb 4.8 oz)     Exam: Gen:  NAD Cardiovascular:  RRR. Chest and lungs:   Good air movement , clear to auscultation. Abdomen:  Abdomen soft, NT/ND, + BS Extremities:  No edema   Data Reviewed:    Labs: Basic Metabolic Panel:  Recent Labs Lab 12/24/15 2000 12/24/15 2330 12/25/15 0428 12/26/15 0400  NA 139  --  138 135  K 4.1  --  4.4 4.6  CL 87*  --  81* 83*  CO2 42*  --  45* 42*  GLUCOSE 155*  --  169* 167*  BUN 7  --  9 13  CREATININE 0.74  --  0.72 0.73  CALCIUM 9.2  --  8.9 8.6*  MG  --  1.5*  --  2.2  PHOS  --  3.1  --   --    GFR Estimated Creatinine Clearance: 41.2 mL/min (by C-G formula based on Cr of 0.73). Liver Function Tests:  Recent Labs Lab 12/24/15 2000 12/25/15 0428 12/26/15 0400  AST 27 26 19   ALT 27 25 20   ALKPHOS 65 62 58  BILITOT 0.4 0.4 0.3  PROT 6.8 6.2* 5.7*  ALBUMIN 3.2* 3.0* 2.9*   No results for input(s): LIPASE, AMYLASE in the last 168 hours. No results for input(s): AMMONIA in the last 168 hours. Coagulation profile No results for input(s): INR, PROTIME in the last 168 hours.  CBC:  Recent Labs Lab 12/24/15 2026 12/25/15 0428 12/26/15 0400  WBC 5.1 3.7* 5.7  NEUTROABS  --  3.5 5.1  HGB 12.5 12.9 12.2  HCT 40.8 42.4 39.9  MCV 103.3* 102.7* 101.0*  PLT 194 203 205   Cardiac Enzymes: No results for input(s): CKTOTAL, CKMB, CKMBINDEX, TROPONINI in the last 168 hours. BNP (last 3 results) No results for input(s): PROBNP in the last 8760 hours. CBG: No results for input(s): GLUCAP in the last 168 hours. D-Dimer: No results for input(s): DDIMER in the last 72 hours. Hgb A1c: No results for input(s): HGBA1C in the last 72 hours. Lipid Profile: No results for input(s): CHOL, HDL, LDLCALC, TRIG, CHOLHDL, LDLDIRECT in the last 72 hours. Thyroid function studies: No results for input(s): TSH, T4TOTAL, T3FREE, THYROIDAB in the last 72 hours.  Invalid input(s): FREET3 Anemia work up: No results for input(s): VITAMINB12, FOLATE,  FERRITIN, TIBC, IRON, RETICCTPCT in the last 72 hours. Sepsis Labs:  Recent Labs Lab 12/24/15 2026 12/24/15 2040 12/24/15 2357 12/25/15 0428 12/26/15 0400  WBC 5.1  --   --  3.7* 5.7  LATICACIDVEN  --  1.59 2.05*  --   --    Microbiology Recent Results (from the past 240 hour(s))  Blood Culture (routine x 2)     Status: None (Preliminary result)   Collection Time: 12/24/15  8:00 PM  Result Value Ref Range Status  Specimen Description BLOOD LEFT ANTECUBITAL  Final   Special Requests IN PEDIATRIC BOTTLE 3CC  Final   Culture  Setup Time   Final    GRAM POSITIVE COCCI IN CHAINS AEROBIC BOTTLE ONLY CRITICAL RESULT CALLED TO, READ BACK BY AND VERIFIED WITH: C Van Buren 12/25/15 @ 39 M VESTAL    Culture [place]  Final   Report Status PENDING  Incomplete  Blood Culture (routine x 2)     Status: None (Preliminary result)   Collection Time: 12/24/15  8:15 PM  Result Value Ref Range Status   Specimen Description BLOOD RIGHT ARM  Final   Special Requests IN PEDIATRIC BOTTLE 4CC  Final   Culture NO GROWTH < 24 HOURS  Final   Report Status PENDING  Incomplete  Urine culture     Status: None (Preliminary result)   Collection Time: 12/24/15  9:09 PM  Result Value Ref Range Status   Specimen Description URINE, CLEAN CATCH  Final   Special Requests NONE  Final   Culture CULTURE REINCUBATED FOR BETTER GROWTH  Final   Report Status PENDING  Incomplete  MRSA PCR Screening     Status: None   Collection Time: 12/25/15 12:41 AM  Result Value Ref Range Status   MRSA by PCR NEGATIVE NEGATIVE Final    Comment:        The GeneXpert MRSA Assay (FDA approved for NASAL specimens only), is one component of a comprehensive MRSA colonization surveillance program. It is not intended to diagnose MRSA infection nor to guide or monitor treatment for MRSA infections.      Medications:   . albuterol  2.5 mg Nebulization Q6H  . ALPRAZolam  0.25 mg Oral BID  . citalopram  10 mg Oral  Daily  . doxycycline (VIBRAMYCIN) IV  100 mg Intravenous Q12H  . enoxaparin (LOVENOX) injection  20 mg Subcutaneous Q24H  . furosemide  40 mg Oral Daily  . levothyroxine  50 mcg Oral QAC breakfast  . loratadine  10 mg Oral Daily  . methylPREDNISolone (SOLU-MEDROL) injection  40 mg Intravenous Q6H  . potassium chloride  10 mEq Oral BID  . tiotropium  18 mcg Inhalation Daily   Continuous Infusions:   Time spent: 15 min   LOS: 2 days   Charlynne Cousins  [facility] Pager [phone]  *Please refer to [email or web address], password [email or web address] to get updated schedule on who will round on this patient, as hospitalists switch teams weekly. If 7PM-7AM, please contact night-coverage at [email or web address], password [email or web address] for any overnight needs.  12/26/2015, 9:21 AM

## 2015-12-26 NOTE — Progress Notes (Signed)
Pt notified by family member that her spouse had passed away, family supportive, pt very distraught requesting something to help her nerves, MD notified, PRN ativan order given, emotional support offered, order for spiritual consult ordered, will continue to monitor closely.  Edward Qualia RN

## 2015-12-26 NOTE — Plan of Care (Signed)
Problem: Skin Integrity: Goal: Risk for impaired skin integrity will decrease Outcome: Not Applicable Date Met:  38/37/79 Patient's Braden score greater than 18.

## 2015-12-26 NOTE — Plan of Care (Signed)
Problem: Education: Goal: Knowledge of  General Education information/materials will improve Outcome: Progressing RN provided medication education to patient on all medications that have been administered thus far this shift.  Patient has denied pain thus far this shift.  Patient aware to call and wait for assistance prior to getting out of bed.  Thus far patient has made no attempts to get out of bed unassisted.  Patient aware of plan of care.

## 2015-12-27 ENCOUNTER — Emergency Department (HOSPITAL_COMMUNITY): Payer: Commercial Managed Care - HMO

## 2015-12-27 ENCOUNTER — Encounter (HOSPITAL_COMMUNITY): Payer: Self-pay | Admitting: *Deleted

## 2015-12-27 ENCOUNTER — Inpatient Hospital Stay (HOSPITAL_COMMUNITY)
Admission: EM | Admit: 2015-12-27 | Discharge: 2016-01-01 | Disposition: A | Discharge status: Home / Self Care | Payer: Commercial Managed Care - HMO | Source: Home / Self Care | Attending: Internal Medicine | Admitting: Internal Medicine

## 2015-12-27 DIAGNOSIS — Z9981 Dependence on supplemental oxygen: Secondary | ICD-10-CM | POA: Insufficient documentation

## 2015-12-27 DIAGNOSIS — J9611 Chronic respiratory failure with hypoxia: Secondary | ICD-10-CM

## 2015-12-27 DIAGNOSIS — E039 Hypothyroidism, unspecified: Secondary | ICD-10-CM | POA: Diagnosis present

## 2015-12-27 DIAGNOSIS — I5032 Chronic diastolic (congestive) heart failure: Secondary | ICD-10-CM

## 2015-12-27 DIAGNOSIS — E43 Unspecified severe protein-calorie malnutrition: Secondary | ICD-10-CM

## 2015-12-27 DIAGNOSIS — J9621 Acute and chronic respiratory failure with hypoxia: Secondary | ICD-10-CM | POA: Insufficient documentation

## 2015-12-27 DIAGNOSIS — I10 Essential (primary) hypertension: Secondary | ICD-10-CM | POA: Diagnosis present

## 2015-12-27 DIAGNOSIS — J449 Chronic obstructive pulmonary disease, unspecified: Secondary | ICD-10-CM | POA: Diagnosis present

## 2015-12-27 DIAGNOSIS — R739 Hyperglycemia, unspecified: Secondary | ICD-10-CM

## 2015-12-27 DIAGNOSIS — R7881 Bacteremia: Secondary | ICD-10-CM

## 2015-12-27 HISTORY — DX: Anxiety disorder, unspecified: F41.9

## 2015-12-27 HISTORY — DX: Unspecified osteoarthritis, unspecified site: M19.90

## 2015-12-27 HISTORY — DX: Acute embolism and thrombosis of unspecified deep veins of unspecified lower extremity: I82.409

## 2015-12-27 HISTORY — DX: Dependence on supplemental oxygen: Z99.81

## 2015-12-27 HISTORY — DX: Unspecified chronic bronchitis: J42

## 2015-12-27 LAB — CBC WITH DIFFERENTIAL/PLATELET
BASOS ABS: 0 10*3/uL (ref 0.0–0.1)
BASOS PCT: 1 %
Basophils Absolute: 0 10*3/uL (ref 0.0–0.1)
Basophils Relative: 0 %
EOS ABS: 0 10*3/uL (ref 0.0–0.7)
EOS PCT: 0 %
Eosinophils Absolute: 0 10*3/uL (ref 0.0–0.7)
Eosinophils Relative: 0 %
HCT: 52.4 % — ABNORMAL HIGH (ref 36.0–46.0)
HEMATOCRIT: 42.3 % (ref 36.0–46.0)
HEMOGLOBIN: 13.1 g/dL (ref 12.0–15.0)
HEMOGLOBIN: 15.7 g/dL — AB (ref 12.0–15.0)
LYMPHS ABS: 1.1 10*3/uL (ref 0.7–4.0)
LYMPHS PCT: 19 %
Lymphocytes Relative: 14 %
Lymphs Abs: 0.6 10*3/uL — ABNORMAL LOW (ref 0.7–4.0)
MCH: 32.3 pg (ref 26.0–34.0)
MCH: 31.5 pg (ref 26.0–34.0)
MCHC: 31 g/dL (ref 30.0–36.0)
MCHC: 30 g/dL (ref 30.0–36.0)
MCV: 104.4 fL — AB (ref 78.0–100.0)
MCV: 105.2 fL — ABNORMAL HIGH (ref 78.0–100.0)
MONO ABS: 0.4 10*3/uL (ref 0.1–1.0)
MONOS PCT: 9 %
Monocytes Absolute: 1.2 10*3/uL — ABNORMAL HIGH (ref 0.1–1.0)
Monocytes Relative: 21 %
NEUTROS ABS: 3.4 10*3/uL (ref 1.7–7.7)
NEUTROS PCT: 60 %
NEUTROS PCT: 76 %
Neutro Abs: 3.3 10*3/uL (ref 1.7–7.7)
PLATELETS: 214 10*3/uL (ref 150–400)
PLATELETS: 249 10*3/uL (ref 150–400)
RBC: 4.05 MIL/uL (ref 3.87–5.11)
RBC: 4.98 MIL/uL (ref 3.87–5.11)
RDW: 14.4 % (ref 11.5–15.5)
RDW: 14.5 % (ref 11.5–15.5)
WBC: 5.6 10*3/uL (ref 4.0–10.5)
WBC: 4.3 10*3/uL (ref 4.0–10.5)

## 2015-12-27 LAB — COMPREHENSIVE METABOLIC PANEL
ALK PHOS: 75 U/L (ref 38–126)
ALT: 19 U/L (ref 14–54)
ALT: 25 U/L (ref 14–54)
ANION GAP: 12 (ref 5–15)
AST: 18 U/L (ref 15–41)
AST: 26 U/L (ref 15–41)
Albumin: 2.9 g/dL — ABNORMAL LOW (ref 3.5–5.0)
Albumin: 3.9 g/dL (ref 3.5–5.0)
Alkaline Phosphatase: 55 U/L (ref 38–126)
BUN: 12 mg/dL (ref 6–20)
BUN: 12 mg/dL (ref 6–20)
CALCIUM: 9.9 mg/dL (ref 8.9–10.3)
CHLORIDE: 82 mmol/L — AB (ref 101–111)
CO2: 46 mmol/L — ABNORMAL HIGH (ref 22–32)
CREATININE: 0.72 mg/dL (ref 0.44–1.00)
Calcium: 8.9 mg/dL (ref 8.9–10.3)
Chloride: 77 mmol/L — ABNORMAL LOW (ref 101–111)
Creatinine, Ser: 0.65 mg/dL (ref 0.44–1.00)
GFR calc non Af Amer: >60 mL/min (ref >60)
GFR calc non Af Amer: >60 mL/min (ref >60)
Glucose, Bld: 93 mg/dL (ref 65–99)
Glucose, Bld: 200 mg/dL — ABNORMAL HIGH (ref 65–99)
POTASSIUM: 4.4 mmol/L (ref 3.5–5.1)
Potassium: 4.9 mmol/L (ref 3.5–5.1)
SODIUM: 140 mmol/L (ref 135–145)
Sodium: 140 mmol/L (ref 135–145)
TOTAL PROTEIN: 6.1 g/dL — AB (ref 6.5–8.1)
Total Bilirubin: 0.5 mg/dL (ref 0.3–1.2)
Total Bilirubin: 0.4 mg/dL (ref 0.3–1.2)
Total Protein: 8.4 g/dL — ABNORMAL HIGH (ref 6.5–8.1)

## 2015-12-27 LAB — I-STAT CG4 LACTIC ACID, ED
LACTIC ACID, VENOUS: 1.88 mmol/L (ref 0.5–2.0)
Lactic Acid, Venous: 3.56 mmol/L (ref 0.5–2.0)

## 2015-12-27 MED ORDER — SODIUM CHLORIDE 0.9 % IV BOLUS (SEPSIS)
1000.0000 mL | Freq: Once | INTRAVENOUS | Status: AC
  Administered 2015-12-27: 1000 mL via INTRAVENOUS

## 2015-12-27 MED ORDER — GUAIFENESIN ER 600 MG PO TB12: 600.0000 mg | ORAL_TABLET | Freq: Two times a day (BID) | ORAL | Status: DC

## 2015-12-27 MED ORDER — SODIUM CHLORIDE 0.9 % IV BOLUS (SEPSIS)
500.0000 mL | INTRAVENOUS | Status: AC
  Administered 2015-12-27: 500 mL via INTRAVENOUS

## 2015-12-27 MED ORDER — TIOTROPIUM BROMIDE MONOHYDRATE 18 MCG IN CAPS: 18.0000 ug | ORAL_CAPSULE | Freq: Every day | RESPIRATORY_TRACT | Status: DC

## 2015-12-27 MED ORDER — MOMETASONE FURO-FORMOTEROL FUM 200-5 MCG/ACT IN AERO: 2.0000 | INHALATION_SPRAY | Freq: Two times a day (BID) | RESPIRATORY_TRACT | Status: DC

## 2015-12-27 MED ORDER — DOXYCYCLINE HYCLATE 100 MG PO TABS
100.0000 mg | ORAL_TABLET | Freq: Two times a day (BID) | ORAL | Status: DC
Start: 2015-12-27 — End: 2016-01-01

## 2015-12-27 MED ORDER — PREDNISONE 20 MG PO TABS
40.0000 mg | ORAL_TABLET | Freq: Every day | ORAL | Status: DC
  Administered 2015-12-27: 40 mg via ORAL
  Filled 2015-12-27: qty 2

## 2015-12-27 MED ORDER — VANCOMYCIN HCL IN DEXTROSE 1-5 GM/200ML-% IV SOLN
1000.0000 mg | Freq: Once | INTRAVENOUS | Status: AC
  Administered 2015-12-27: 1000 mg via INTRAVENOUS
  Filled 2015-12-27: qty 200

## 2015-12-27 MED ORDER — PREDNISONE 10 MG PO TABS: ORAL_TABLET | ORAL | Status: DC

## 2015-12-27 NOTE — Progress Notes (Signed)
Pt discharge complete, education complete, pt verbalizes understanding of health management, follow up appointments, and reasons to return to the ER.  Edward Qualia RN

## 2015-12-27 NOTE — ED Notes (Signed)
Code sepsis called 1912

## 2015-12-27 NOTE — Plan of Care (Signed)
Problem: Activity: Goal: Risk for activity intolerance will decrease Outcome: Progressing Patient up to bedside commode earlier this evening, no shortness of breath noted during that activity.

## 2015-12-27 NOTE — Progress Notes (Signed)
Pharmacy Code Sepsis Protocol  Time of code sepsis page: [id number] [x]  Antibiotics delivered at [facility]  Were antibiotics ordered at the time of the code sepsis page? Yes Was it required to contact the physician? [x]  Physician not contacted []  Physician contacted to order antibiotics for code sepsis []  Physician contacted to recommend changing antibiotics   Anti-infectives    Start     Dose/Rate Route Frequency Ordered Stop   12/27/15 1915  vancomycin (VANCOCIN) IVPB 1000 mg/200 mL premix     1,000 mg 200 mL/hr over 60 Minutes Intravenous  Once 12/27/15 1909          Nurse education provided: [x]  Minutes left to administer antibiotics to achieve 1 hour goal [x]  Correct order of antibiotic administration [x]  Antibiotic Y-site compatibilities     Octavian Godek C. Lennox Grumbles, PharmD Pharmacy Resident  Pager: [phone] 12/27/2015 8:07 PM

## 2015-12-27 NOTE — ED Notes (Signed)
Pt reports being discharged today & was called back by doctor for possible blood infection, pt reports that her husband died yesterday, pt denies CP, n/v/d, pt uses 4 L [place] at baseline, pt admitted to hospital for SOB, pt A&O x4, follows commands, speaks in complete sentences, pt lethargic

## 2015-12-27 NOTE — Discharge Summary (Signed)
Physician Discharge Summary   Patient ID: Jasmine [name] MRN: [id number] DOB/AGE: [birth year] 70 y.o.  Admit date: 12/24/2015 Discharge date: 12/27/2015  Primary Care Physician:  [name], MD  Discharge Diagnoses:    Acute on chronic respiratory failure with hypoxia  . Essential hypertension . COPD (chronic obstructive pulmonary disease) (HCC) exacerbation . Hypomagnesemia . Depression with anxiety . Hypothyroidism following radioiodine therapy . Tobacco abuse disorder  Consults:  None   Recommendations for Outpatient Follow-up:  1. Please repeat CBC/BMET at next visit   DIET: Heart healthy diet    Allergies:   Allergies  Allergen Reactions  . Alendronate Sodium Other (See Comments)    Pt does not remember this reaction     DISCHARGE MEDICATIONS: Current Discharge Medication List    START taking these medications   Details  doxycycline (VIBRA-TABS) 100 MG tablet Take 1 tablet (100 mg total) by mouth 2 (two) times daily. x7 days Qty: 14 tablet, Refills: 0    guaiFENesin (MUCINEX) 600 MG 12 hr tablet Take 1 tablet (600 mg total) by mouth 2 (two) times daily. Qty: 30 tablet, Refills: 0    predniSONE (DELTASONE) 10 MG tablet Prednisone dosing: Take  Prednisone 40mg  (4 tabs) x 2 days, then taper to 30mg  (3 tabs) x 3 days, then 20mg  (2 tabs) x 3days, then 10mg  (1 tab) x 3days, then OFF.  Dispense:  26 tabs, refills: None Qty: 26 tablet, Refills: 0      CONTINUE these medications which have CHANGED   Details  mometasone-formoterol (DULERA) 200-5 MCG/ACT AERO Inhale 2 puffs into the lungs 2 (two) times daily. INHALE 2 PUFFS INTO THE LUNGS 2 (TWO) TIMES DAILY. Qty: 13 g, Refills: 3    tiotropium (SPIRIVA HANDIHALER) 18 MCG inhalation capsule Place 1 capsule (18 mcg total) into inhaler and inhale daily. Qty: 30 capsule, Refills: 2      CONTINUE these medications which have NOT CHANGED   Details  albuterol (PROVENTIL) (2.5 MG/3ML) 0.083% nebulizer solution Take 3  mLs (2.5 mg total) by nebulization every 4 (four) hours as needed for wheezing. Qty: 75 mL, Refills: 12    ALPRAZolam (XANAX) 0.25 MG tablet TAKE 1 TABLET BY MOUTH TWICE DAILY Qty: 60 tablet, Refills: 1    citalopram (CELEXA) 10 MG tablet Take 1 tablet (10 mg total) by mouth daily. Qty: 30 tablet, Refills: 3    fluticasone furoate-vilanterol (BREO ELLIPTA) 200-25 MCG/INH AEPB Inhale 1 puff into the lungs daily. Qty: 1 each, Refills: 5    furosemide (LASIX) 40 MG tablet Take 1 tablet (40 mg total) by mouth daily. Qty: 30 tablet, Refills: 3    levothyroxine (SYNTHROID, LEVOTHROID) 50 MCG tablet Take 1 tablet (50 mcg total) by mouth daily. Qty: 30 tablet, Refills: 3    OXYGEN Inhale into the lungs continuous.    potassium chloride (K-DUR) 10 MEQ tablet Take 1 tablet (10 mEq total) by mouth 2 (two) times daily. Qty: 60 tablet, Refills: 3    cetirizine (ZYRTEC) 10 MG tablet TAKE 1 TABLET (10 MG TOTAL) BY MOUTH DAILY. Qty: 30 tablet, Refills: 0         Brief H and P: For complete details please refer to admission H and P, but in briefPhyllis D [name] is an 70 y.o. female past medical history of COPD oxygen dependent was admitted with shortness of breath.EMS reports that her initial oxygen saturations were in the 60s and with a nonrebreather mask, her saturations went up to the 90s. In the ER, she  was given supplemental oxygen, Solu-Medrol, bronchodilators and was started on BiPAP ventilation. In ED, patient had significant improvement.  Hospital Course:   Acute on chronic respiratory failure (HCC) due to COPD (chronic obstructive pulmonary disease) ([place]): Patient initially required BiPAP, now on nasal cannula. O2 sats 92% on 3 L, patient is on 3 L O2 at baseline at home. She had mild rise in the lactic acid due to hypoxia, no sepsis.  Patient was placed on IV steroids and antibiotics. She was transitioned to oral prednisone with taper.  Patient was also placed on Spiriva, Mucinex,  doxycycline. Outpatient pulmonology Hospital follow-up appointment has been arranged. Patient was strongly counseled to quit smoking.  Depression with anxiety: Continue Celexa original and alprazolam as needed.  Essential hypertension: She is at goal. Seems to be stable continue current regimen.  Hypothyroidism following radioiodine therapy Continue Synthroid.  Hypomagnesemia: Due to diuretic use, improved.  Tobacco abuse disorder - Patient was strongly counseled to quit smoking  Day of Discharge BP 108/69 mmHg  Pulse 90  Temp(Src) 98.7 F (37.1 C) (Oral)  Resp 14  Ht 5' (1.524 m)  Wt 39.826 kg (87 lb 12.8 oz)  BMI 17.15 kg/m2  SpO2 92%  Physical Exam: General: Alert and awake oriented x3 not in any acute distress. HEENT: anicteric sclera, pupils reactive to light and accommodation CVS: S1-S2 clear no murmur rubs or gallops Chest: clear to auscultation bilaterally, no wheezing rales or rhonchi Abdomen: soft nontender, nondistended, normal bowel sounds Extremities: no cyanosis, clubbing or edema noted bilaterally Neuro: Cranial nerves II-XII intact, no focal neurological deficits   The results of significant diagnostics from this hospitalization (including imaging, microbiology, ancillary and laboratory) are listed below for reference.    LAB RESULTS: Basic Metabolic Panel:  Recent Labs Lab 12/24/15 2330  12/26/15 0400 12/27/15 0341  NA  --   < > 135 140  K  --   < > 4.6 4.4  CL  --   < > 83* 82*  CO2  --   < > 42* 46*  GLUCOSE  --   < > 167* 93  BUN  --   < > 13 12  CREATININE  --   < > 0.73 0.65  CALCIUM  --   < > 8.6* 8.9  MG 1.5*  --  2.2  --   PHOS 3.1  --   --   --   < > = values in this interval not displayed. Liver Function Tests:  Recent Labs Lab 12/26/15 0400 12/27/15 0341  AST 19 18  ALT 20 19  ALKPHOS 58 55  BILITOT 0.3 0.5  PROT 5.7* 6.1*  ALBUMIN 2.9* 2.9*   No results for input(s): LIPASE, AMYLASE in the last 168 hours. No  results for input(s): AMMONIA in the last 168 hours. CBC:  Recent Labs Lab 12/26/15 0400 12/27/15 0341  WBC 5.7 5.6  NEUTROABS 5.1 3.4  HGB 12.2 13.1  HCT 39.9 42.3  MCV 101.0* 104.4*  PLT 205 214   Cardiac Enzymes: No results for input(s): CKTOTAL, CKMB, CKMBINDEX, TROPONINI in the last 168 hours. BNP: Invalid input(s): POCBNP CBG: No results for input(s): GLUCAP in the last 168 hours.  Significant Diagnostic Studies:  Dg Chest Port 1 View  12/24/2015  CLINICAL DATA:  70 year old female with shortness of breath today. History of CHF. EXAM: PORTABLE CHEST 1 VIEW COMPARISON:  No priors. FINDINGS: Mild diffuse peribronchial cuffing and patchy areas of interstitial prominence, similar to prior examinations. Mild emphysema.  No acute consolidative airspace disease. No pleural effusions. No definite suspicious appearing pulmonary nodules or masses. Heart size is normal. The patient is rotated to the left on today's exam, resulting in distortion of the mediastinal contours and reduced diagnostic sensitivity and specificity for mediastinal pathology. Atherosclerotic calcifications in the thoracic aorta. IMPRESSION: 1. No definite radiographic evidence of acute cardiopulmonary disease. 2. Peribronchial cuffing and emphysematous changes, similar to prior examination, suggestive of underlying COPD. 3. Atherosclerosis. Electronically Signed   By: Vinnie Langton M.D.   On: 12/24/2015 20:24    2D ECHO:   Disposition and Follow-up: Discharge Instructions    Diet - low sodium heart healthy    Complete by:  As directed      Discharge instructions    Complete by:  As directed   Please stop smoking  Please continue Albuterol Nebulizer three times a day for next 3 days, then as needed     Increase activity slowly    Complete by:  As directed             DISPOSITION: Home DISCHARGE FOLLOW-UP Follow-up Information    Follow up with [name], MD. Go on 01/03/2016.   Specialties:  Internal  Medicine, Radiology   Why:  2:00 PM for hospital follow-up   Contact information:   [address] [place] [id number] [phone]       Follow up with Rigoberto Noel., MD. Daphane Shepherd on 01/11/2016.   Specialty:  Pulmonary Disease   Why:   10:30 AM  for hospital follow-up Tammy Nurse Practitioner   Contact information:   [address] [place]. [place] [id number] [phone]        Time spent on Discharge: 35 minutes  Signed:   RAI,RIPUDEEP M.D. [facility] 12/27/2015, 12:05 PM Pager: [phone]

## 2015-12-27 NOTE — Discharge Instructions (Signed)
Chronic Obstructive Pulmonary Disease °Chronic obstructive pulmonary disease (COPD) is a common lung condition in which airflow from the lungs is limited. COPD is a general term that can be used to describe many different lung problems that limit airflow, including both chronic bronchitis and emphysema. If you have COPD, your lung function will probably never return to normal, but there are measures you can take to improve lung function and make yourself feel better. °CAUSES  °· Smoking (common). °· Exposure to secondhand smoke. °· Genetic problems. °· Chronic inflammatory lung diseases or recurrent infections. °SYMPTOMS °· Shortness of breath, especially with physical activity. °· Deep, persistent (chronic) cough with a large amount of thick mucus. °· Wheezing. °· Rapid breaths (tachypnea). °· Gray or bluish discoloration (cyanosis) of the skin, especially in your fingers, toes, or lips. °· Fatigue. °· Weight loss. °· Frequent infections or episodes when breathing symptoms become much worse (exacerbations). °· Chest tightness. °DIAGNOSIS °Your health care provider will take a medical history and perform a physical examination to diagnose COPD. Additional tests for COPD may include: °· Lung (pulmonary) function tests. °· Chest X-ray. °· CT scan. °· Blood tests. °TREATMENT  °Treatment for COPD may include: °· Inhaler and nebulizer medicines. These help manage the symptoms of COPD and make your breathing more comfortable. °· Supplemental oxygen. Supplemental oxygen is only helpful if you have a low oxygen level in your blood. °· Exercise and physical activity. These are beneficial for nearly all people with COPD. °· Lung surgery or transplant. °· Nutrition therapy to gain weight, if you are underweight. °· Pulmonary rehabilitation. This may involve working with a team of health care providers and specialists, such as respiratory, occupational, and physical therapists. °HOME CARE INSTRUCTIONS °· Take all medicines  (inhaled or pills) as directed by your health care provider. °· Avoid over-the-counter medicines or cough syrups that dry up your airway (such as antihistamines) and slow down the elimination of secretions unless instructed otherwise by your health care provider. °· If you are a smoker, the most important thing that you can do is stop smoking. Continuing to smoke will cause further lung damage and breathing trouble. Ask your health care provider for help with quitting smoking. He or she can direct you to community resources or hospitals that provide support. °· Avoid exposure to irritants such as smoke, chemicals, and fumes that aggravate your breathing. °· Use oxygen therapy and pulmonary rehabilitation if directed by your health care provider. If you require home oxygen therapy, ask your health care provider whether you should purchase a pulse oximeter to measure your oxygen level at home. °· Avoid contact with individuals who have a contagious illness. °· Avoid extreme temperature and humidity changes. °· Eat healthy foods. Eating smaller, more frequent meals and resting before meals may help you maintain your strength. °· Stay active, but balance activity with periods of rest. Exercise and physical activity will help you maintain your ability to do things you want to do. °· Preventing infection and hospitalization is very important when you have COPD. Make sure to receive all the vaccines your health care provider recommends, especially the pneumococcal and influenza vaccines. Ask your health care provider whether you need a pneumonia vaccine. °· Learn and use relaxation techniques to manage stress. °· Learn and use controlled breathing techniques as directed by your health care provider. Controlled breathing techniques include: °¨ Pursed lip breathing. Start by breathing in (inhaling) through your nose for 1 second. Then, purse your lips as if you were   going to whistle and breathe out (exhale) through the  pursed lips for 2 seconds. °¨ Diaphragmatic breathing. Start by putting one hand on your abdomen just above your waist. Inhale slowly through your nose. The hand on your abdomen should move out. Then purse your lips and exhale slowly. You should be able to feel the hand on your abdomen moving in as you exhale. °· Learn and use controlled coughing to clear mucus from your lungs. Controlled coughing is a series of short, progressive coughs. The steps of controlled coughing are: °1. Lean your head slightly forward. °2. Breathe in deeply using diaphragmatic breathing. °3. Try to hold your breath for 3 seconds. °4. Keep your mouth slightly open while coughing twice. °5. Spit any mucus out into a tissue. °6. Rest and repeat the steps once or twice as needed. °SEEK MEDICAL CARE IF: °· You are coughing up more mucus than usual. °· There is a change in the color or thickness of your mucus. °· Your breathing is more labored than usual. °· Your breathing is faster than usual. °SEEK IMMEDIATE MEDICAL CARE IF: °· You have shortness of breath while you are resting. °· You have shortness of breath that prevents you from: °¨ Being able to talk. °¨ Performing your usual physical activities. °· You have chest pain lasting longer than 5 minutes. °· Your skin color is more cyanotic than usual. °· You measure low oxygen saturations for longer than 5 minutes with a pulse oximeter. °MAKE SURE YOU: °· Understand these instructions. °· Will watch your condition. °· Will get help right away if you are not doing well or get worse. °  °This information is not intended to replace advice given to you by your health care provider. Make sure you discuss any questions you have with your health care provider. °  °Document Released: 07/16/2005 Document Revised: 10/27/2014 Document Reviewed: 06/02/2013 °Elsevier Interactive Patient Education ©2016 [employer] °Respiratory failure is when your lungs are not working well and your breathing  (respiratory) system fails. When respiratory failure occurs, it is difficult for your lungs to get enough oxygen, get rid of carbon dioxide, or both. Respiratory failure can be life threatening.  °Respiratory failure can be acute or chronic. Acute respiratory failure is sudden, severe, and requires emergency medical treatment. Chronic respiratory failure is less severe, happens over time, and requires ongoing treatment.  °WHAT ARE THE CAUSES OF ACUTE RESPIRATORY FAILURE?  °Any problem affecting the heart or lungs can cause acute respiratory failure. Some of these causes include the following: °· Chronic bronchitis and emphysema (COPD).   °· Blood clot going to a lung (pulmonary embolism).   °· Having water in the lungs caused by heart failure, lung injury, or infection (pulmonary edema).   °· Collapsed lung (pneumothorax).   °· Pneumonia.   °· Pulmonary fibrosis.   °· Obesity.   °· Asthma.   °· Heart failure.   °· Any type of trauma to the chest that can make breathing difficult.   °· Nerve or muscle diseases making chest movements difficult. °HOW WILL MY ACUTE RESPIRATORY FAILURE BE TREATED?  °Treatment of acute respiratory failure depends on the cause of the respiratory failure. Usually, you will stay in the intensive care unit so your breathing can be watched closely. Treatment can include the following: °· Oxygen. Oxygen can be delivered through the following: °· Nasal cannula. This is small tubing that goes in your nose to give you oxygen. °· Face mask. A face mask covers your nose and mouth to give you oxygen. °· Medicine. Different   medicines can be given to help with breathing. These can include: °· Nebulizers. Nebulizers deliver medicines to open the air passages (bronchodilators). These medicines help to open or relax the airways in the lungs so you can breathe better. They can also help loosen mucus from your lungs. °· Diuretics. Diuretic medicines can help you breathe better by getting rid of extra water  in your body. °· Steroids. Steroid medicines can help decrease swelling (inflammation) in your lungs. °· Antibiotics. °· Chest tube. If you have a collapsed lung (pneumothorax), a chest tube is placed to help reinflate the lung. °· Noninvasive positive pressure ventilation (NPPV). This is a tight-fitting mask that goes over your nose and mouth. The mask has tubing that is attached to a machine. The machine blows air into the tubing, which helps to keep the tiny air sacs (alveoli) in your lungs open. This machine allows you to breathe on your own. °· Ventilator. A ventilator is a breathing machine. When on a ventilator, a breathing tube is put into the lungs. A ventilator is used when you can no longer breathe well enough on your own. You may have low oxygen levels or high carbon dioxide (CO2) levels in your blood. When you are on a ventilator, sedation and pain medicines are given to make you sleep so your lungs can heal. °SEEK IMMEDIATE MEDICAL CARE IF: °· You have shortness of breath (dyspnea) with or without activity. °· You have rapid breathing (tachypnea). °· You are wheezing. °· You are unable to say more than a few words without having to catch your breath. °· You find it very difficult to function normally. °· You have a fast heart rate. °· You have a bluish color to your finger or toe nail beds. °· You have confusion or drowsiness or both. °  °This information is not intended to replace advice given to you by your health care provider. Make sure you discuss any questions you have with your health care provider. °  °Document Released: 10/11/2013 Document Revised: 06/27/2015 Document Reviewed: 10/11/2013 °Elsevier Interactive Patient Education ©2016 [employer]. ° °

## 2015-12-27 NOTE — Care Management Important Message (Signed)
Important Message  Patient Details  Name: Jasmine [name] MRN: [id number] Date of Birth: 16-Jun-1946   Medicare Important Message Given:  Yes    Nathen May 12/27/2015, 11:48 AM

## 2015-12-27 NOTE — Progress Notes (Signed)
Patient was just discharged today, doing well. I received message from Dr Megan Salon and lab that 1/2 blood cultures is now coming positive for Strept viridans. Dr Megan Salon recommended Echo, may need TEE, follow the final blood cultures.  - I have called the patient, talked to the patient Jasmine [name] and her niece, Jasmine [name]. Jasmine [name] will bring the patient back to ER. I also spoke with ER registration, gave them the patient information. I also spoke with Dr Waldron [facility] and gave detailed report.  Patient will need repeat blood cultures, ID consult, IV vancomycin, Echo, may need TEE.   [name] M.D. [facility] 12/27/2015, 4:20 PM  Pager: [phone]

## 2015-12-27 NOTE — ED Provider Notes (Signed)
CSN: [id number]     Arrival date & time 12/27/15  1706 History   First MD Initiated Contact with Patient 12/27/15 1903     Chief Complaint  Patient presents with  . Abnormal Lab      The history is provided by the patient.  Patient with h/o asthma/CHF, on home oxygen, presents for abnormal labs She was just in hospital, was discharged home and was called to come back due to abnormal blood cultures  She denies fever/vomiting/cp No new SOB No weakness Her course is stable Nothing changes her course   Past Medical History  Diagnosis Date  . ALLERGIC RHINITIS 05/01/2007  . ASTHMA 05/01/2007  . BACK PAIN 10/10/2008  . DEPRESSION 07/21/2008  . FATIGUE 10/10/2010  . HYPERTENSION 05/01/2007  . HYPERTHYROIDISM 11/23/2007    Pt endorses having had Graves disease, possibly radioactive iodine x1, but no thyroidectomy  . HYPOTHYROIDISM 08/23/2009  . LUMBAR RADICULOPATHY, RIGHT 08/25/2008  . OSTEOPOROSIS 05/01/2007  . SHOULDER PAIN, LEFT 08/23/2009  . SINUSITIS, CHRONIC 10/10/2010  . SINUSITIS- ACUTE-NOS 10/10/2008  . H/O blood clots     left leg 1960's  . CHF (congestive heart failure) [facility])    Past Surgical History  Procedure Laterality Date  . Appendectomy    . Ectopic pregnancy surgery    . Post fallopian tube removal right side    . History of multiple dialations and curettages and miscarriages, unfortunately never carrying a child to term    . Electrocardiogram  06/20/2006  . Tubal ligation     Family History  Problem Relation Age of Onset  . Alcohol abuse Brother   . Diabetes Brother   . Hypertension Other   . Lung cancer Father   . Stroke Mother   . Asthma      maternal aunts  . Thyroid disease Mother    Social History  Substance Use Topics  . Smoking status: Former Smoker -- 0.05 packs/day for 40 years    Types: Cigarettes    Quit date: 07/21/2015  . Smokeless tobacco: Never Used     Comment: 2-3 cigs/daily  . Alcohol Use: 1.2 oz/week    2 Cans of beer per week   Comment: 2 beers a day, occasional liquor, ? minimizing   OB History    No data available     Review of Systems  Constitutional: Negative for fever.  Respiratory: Negative for shortness of breath.   Cardiovascular: Negative for chest pain.  Gastrointestinal: Negative for vomiting and diarrhea.  Neurological: Negative for weakness.  All other systems reviewed and are negative.     Allergies  Alendronate sodium  Home Medications   Prior to Admission medications   Medication Sig Start Date End Date Taking? Authorizing Provider  albuterol (PROVENTIL) (2.5 MG/3ML) 0.083% nebulizer solution Take 3 mLs (2.5 mg total) by nebulization every 4 (four) hours as needed for wheezing. 06/25/14  Yes Theodis Blaze, MD  ALPRAZolam Duanne Moron) 0.25 MG tablet TAKE 1 TABLET BY MOUTH TWICE DAILY 09/25/15  Yes Biagio Borg, MD  citalopram (CELEXA) 10 MG tablet Take 1 tablet (10 mg total) by mouth daily. 10/12/15  Yes Biagio Borg, MD  doxycycline (VIBRA-TABS) 100 MG tablet Take 1 tablet (100 mg total) by mouth 2 (two) times daily. x7 days 12/27/15  Yes Ripudeep K Rai, MD  fluticasone furoate-vilanterol (BREO ELLIPTA) 200-25 MCG/INH AEPB Inhale 1 puff into the lungs daily. 12/21/15  Yes Rigoberto Noel, MD  furosemide (LASIX) 40 MG tablet Take 1  tablet (40 mg total) by mouth daily. 10/12/15  Yes Biagio Borg, MD  guaiFENesin (MUCINEX) 600 MG 12 hr tablet Take 1 tablet (600 mg total) by mouth 2 (two) times daily. 12/27/15  Yes Ripudeep Krystal Eaton, MD  levothyroxine (SYNTHROID, LEVOTHROID) 50 MCG tablet Take 1 tablet (50 mcg total) by mouth daily. 10/12/15  Yes Biagio Borg, MD  mometasone-formoterol [facility]) 200-5 MCG/ACT AERO Inhale 2 puffs into the lungs 2 (two) times daily. INHALE 2 PUFFS INTO THE LUNGS 2 (TWO) TIMES DAILY. 12/27/15  Yes Ripudeep K Rai, MD  OXYGEN Inhale 4 L into the lungs continuous.    Yes Historical Provider, MD  potassium chloride (K-DUR) 10 MEQ tablet Take 1 tablet (10 mEq total) by mouth 2 (two) times  daily. 10/12/15  Yes Biagio Borg, MD  tiotropium (SPIRIVA HANDIHALER) 18 MCG inhalation capsule Place 1 capsule (18 mcg total) into inhaler and inhale daily. 12/27/15  Yes Ripudeep K Rai, MD   BP 123/74 mmHg  Pulse 89  Wt 39.1 kg  SpO2 91% Physical Exam CONSTITUTIONAL: elderly, thin HEAD: Normocephalic/atraumatic EYES: EOMI ENMT: Mucous membranes moist NECK: supple no meningeal signs SPINE/BACK:entire spine nontender CV: S1/S2 noted LUNGS: coarse BS noted bilaterally, no distress ABDOMEN: soft, nontender GU:no cva tenderness NEURO: Pt is awake/alert/appropriate, moves all extremitiesx4.   EXTREMITIES: pulses normal/equal, full ROM SKIN: warm, color normal PSYCH: no abnormalities of mood noted, alert and oriented to situation  ED Course  Procedures  8:43 PM Pt called back for +blood culture with strep She will need to be admitted for IV vancomycin and I already spoke to [facility] about this patient IV fluids/abx ordered 8:50 PM D/w dr Hal Hope for admission  Labs Review Labs Reviewed  COMPREHENSIVE METABOLIC PANEL - Abnormal; Notable for the following:    Chloride 77 (*)    CO2 >50 (*)    Glucose, Bld 200 (*)    Total Protein 8.4 (*)    All other components within normal limits  CBC WITH DIFFERENTIAL/PLATELET - Abnormal; Notable for the following:    Hemoglobin 15.7 (*)    HCT 52.4 (*)    MCV 105.2 (*)    Lymphs Abs 0.6 (*)    All other components within normal limits  I-STAT CG4 LACTIC ACID, ED - Abnormal; Notable for the following:    Lactic Acid, Venous 3.56 (*)    All other components within normal limits  CULTURE, BLOOD (ROUTINE X 2)  CULTURE, BLOOD (ROUTINE X 2)  URINE CULTURE  URINALYSIS, ROUTINE W REFLEX MICROSCOPIC (NOT AT [facility])  I-STAT CG4 LACTIC ACID, ED    Imaging Review Dg Chest 2 View  12/27/2015  CLINICAL DATA:  Abnormal labs. EXAM: CHEST  2 VIEW COMPARISON:  12/24/2015 FINDINGS: Lungs are hyperinflated. There prominence of interstitial markings  throughout the lungs, unchanged since prior studies. Perihilar peribronchial thickening is noted. There are no focal consolidations. More confluent opacity is identified in the left upper lobe. Is difficult to exclude a pulmonary nodule. No evidence for pulmonary edema. IMPRESSION: Chronic fibrotic changes. Possible left upper lobe pulmonary nodule. Consider further evaluation with CT of the chest. Intravenous contrast is recommended unless it is contraindicated. Electronically Signed   By: Nolon Nations M.D.   On: 12/27/2015 18:20   I have personally reviewed and evaluated these images and lab results as part of my medical decision-making.   MDM   Final diagnoses:  Bacteremia  Hyperglycemia    Nursing notes including past medical history and social history  reviewed and considered in documentation Labs/vital reviewed myself and considered during evaluation Previous records reviewed and considered xrays/imaging reviewed by myself and considered during evaluation     Ripley Fraise, MD 12/27/15 2050

## 2015-12-28 ENCOUNTER — Encounter (HOSPITAL_COMMUNITY): Payer: Self-pay | Admitting: General Practice

## 2015-12-28 ENCOUNTER — Telehealth: Payer: Self-pay | Admitting: *Deleted

## 2015-12-28 ENCOUNTER — Inpatient Hospital Stay (HOSPITAL_COMMUNITY): Payer: Commercial Managed Care - HMO

## 2015-12-28 DIAGNOSIS — B9561 Methicillin susceptible Staphylococcus aureus infection as the cause of diseases classified elsewhere: Secondary | ICD-10-CM

## 2015-12-28 DIAGNOSIS — J438 Other emphysema: Secondary | ICD-10-CM

## 2015-12-28 DIAGNOSIS — E43 Unspecified severe protein-calorie malnutrition: Secondary | ICD-10-CM

## 2015-12-28 DIAGNOSIS — I10 Essential (primary) hypertension: Secondary | ICD-10-CM

## 2015-12-28 DIAGNOSIS — A491 Streptococcal infection, unspecified site: Secondary | ICD-10-CM | POA: Insufficient documentation

## 2015-12-28 DIAGNOSIS — E038 Other specified hypothyroidism: Secondary | ICD-10-CM

## 2015-12-28 DIAGNOSIS — R7881 Bacteremia: Secondary | ICD-10-CM

## 2015-12-28 LAB — COMPREHENSIVE METABOLIC PANEL
ALT: 18 U/L (ref 14–54)
ANION GAP: 9 (ref 5–15)
AST: 21 U/L (ref 15–41)
Albumin: 2.8 g/dL — ABNORMAL LOW (ref 3.5–5.0)
Alkaline Phosphatase: 54 U/L (ref 38–126)
BUN: 9 mg/dL (ref 6–20)
CHLORIDE: 85 mmol/L — AB (ref 101–111)
CO2: 47 mmol/L — AB (ref 22–32)
CREATININE: 0.57 mg/dL (ref 0.44–1.00)
Calcium: 9 mg/dL (ref 8.9–10.3)
Glucose, Bld: 84 mg/dL (ref 65–99)
POTASSIUM: 4.7 mmol/L (ref 3.5–5.1)
SODIUM: 141 mmol/L (ref 135–145)
Total Bilirubin: 0.5 mg/dL (ref 0.3–1.2)
Total Protein: 6.1 g/dL — ABNORMAL LOW (ref 6.5–8.1)

## 2015-12-28 LAB — URINALYSIS, ROUTINE W REFLEX MICROSCOPIC
BILIRUBIN URINE: NEGATIVE
GLUCOSE, UA: 100 mg/dL — AB
HGB URINE DIPSTICK: NEGATIVE
Ketones, ur: NEGATIVE mg/dL
Nitrite: NEGATIVE
PH: 7.5 (ref 5.0–8.0)
Protein, ur: 100 mg/dL — AB
SPECIFIC GRAVITY, URINE: 1.025 (ref 1.005–1.030)

## 2015-12-28 LAB — CBC WITH DIFFERENTIAL/PLATELET
BASOS PCT: 0 %
Basophils Absolute: 0 10*3/uL (ref 0.0–0.1)
EOS PCT: 0 %
Eosinophils Absolute: 0 10*3/uL (ref 0.0–0.7)
HEMATOCRIT: 43.3 % (ref 36.0–46.0)
Hemoglobin: 12.6 g/dL (ref 12.0–15.0)
LYMPHS PCT: 30 %
Lymphs Abs: 1.5 10*3/uL (ref 0.7–4.0)
MCH: 30.5 pg (ref 26.0–34.0)
MCHC: 29.1 g/dL — ABNORMAL LOW (ref 30.0–36.0)
MCV: 104.8 fL — AB (ref 78.0–100.0)
MONO ABS: 1.2 10*3/uL — AB (ref 0.1–1.0)
MONOS PCT: 24 %
NEUTROS PCT: 46 %
Neutro Abs: 2.3 10*3/uL (ref 1.7–7.7)
Platelets: 226 10*3/uL (ref 150–400)
RBC: 4.13 MIL/uL (ref 3.87–5.11)
RDW: 14.6 % (ref 11.5–15.5)
WBC: 5 10*3/uL (ref 4.0–10.5)

## 2015-12-28 LAB — RAPID URINE DRUG SCREEN, HOSP PERFORMED
AMPHETAMINES: NOT DETECTED
Barbiturates: NOT DETECTED
Benzodiazepines: POSITIVE — AB
Cocaine: NOT DETECTED
Opiates: NOT DETECTED
TETRAHYDROCANNABINOL: NOT DETECTED

## 2015-12-28 LAB — ECHOCARDIOGRAM COMPLETE
HEIGHTINCHES: 60 in
Weight: 1439.16 oz

## 2015-12-28 LAB — URINE MICROSCOPIC-ADD ON

## 2015-12-28 LAB — CULTURE, BLOOD (ROUTINE X 2)

## 2015-12-28 LAB — SEDIMENTATION RATE: Sed Rate: 9 mm/hr (ref 0–22)

## 2015-12-28 MED ORDER — LEVOTHYROXINE SODIUM 50 MCG PO TABS
50.0000 ug | ORAL_TABLET | Freq: Every day | ORAL | Status: DC
Start: 2015-12-28 — End: 2015-12-30
  Administered 2015-12-28 – 2015-12-30 (×3): 50 ug via ORAL
  Filled 2015-12-28 (×3): qty 1

## 2015-12-28 MED ORDER — FUROSEMIDE 40 MG PO TABS
40.0000 mg | ORAL_TABLET | Freq: Every day | ORAL | Status: DC
  Administered 2015-12-28 – 2016-01-01 (×5): 40 mg via ORAL
  Filled 2015-12-28 (×5): qty 1

## 2015-12-28 MED ORDER — ALPRAZOLAM 0.25 MG PO TABS
0.2500 mg | ORAL_TABLET | Freq: Two times a day (BID) | ORAL | Status: DC
  Administered 2015-12-28 – 2016-01-01 (×10): 0.25 mg via ORAL
  Filled 2015-12-28 (×10): qty 1

## 2015-12-28 MED ORDER — ALBUTEROL SULFATE (2.5 MG/3ML) 0.083% IN NEBU: 2.5000 mg | INHALATION_SOLUTION | RESPIRATORY_TRACT | Status: DC | PRN

## 2015-12-28 MED ORDER — ENOXAPARIN SODIUM 30 MG/0.3ML ~~LOC~~ SOLN
20.0000 mg | SUBCUTANEOUS | Status: DC
  Administered 2015-12-28 – 2015-12-31 (×4): 20 mg via SUBCUTANEOUS
  Filled 2015-12-28: qty 0.3
  Filled 2015-12-28: qty 0.2
  Filled 2015-12-28: qty 0.3
  Filled 2015-12-28 (×2): qty 0.2
  Filled 2015-12-28: qty 0.3
  Filled 2015-12-28 (×2): qty 0.2

## 2015-12-28 MED ORDER — CETYLPYRIDINIUM CHLORIDE 0.05 % MT LIQD
7.0000 mL | Freq: Two times a day (BID) | OROMUCOSAL | Status: DC
  Administered 2015-12-28 – 2016-01-01 (×8): 7 mL via OROMUCOSAL

## 2015-12-28 MED ORDER — ONDANSETRON HCL 4 MG/2ML IJ SOLN: 4.0000 mg | Freq: Four times a day (QID) | INTRAMUSCULAR | Status: DC | PRN

## 2015-12-28 MED ORDER — TIOTROPIUM BROMIDE MONOHYDRATE 18 MCG IN CAPS
18.0000 ug | ORAL_CAPSULE | Freq: Every day | RESPIRATORY_TRACT | Status: DC
Start: 2015-12-28 — End: 2016-01-01
  Administered 2015-12-28 – 2016-01-01 (×5): 18 ug via RESPIRATORY_TRACT
  Filled 2015-12-28: qty 5

## 2015-12-28 MED ORDER — CITALOPRAM HYDROBROMIDE 20 MG PO TABS
10.0000 mg | ORAL_TABLET | Freq: Every day | ORAL | Status: DC
  Administered 2015-12-28 – 2016-01-01 (×5): 10 mg via ORAL
  Filled 2015-12-28 (×5): qty 1

## 2015-12-28 MED ORDER — ACETAMINOPHEN 325 MG PO TABS: 650.0000 mg | ORAL_TABLET | Freq: Four times a day (QID) | ORAL | Status: DC | PRN

## 2015-12-28 MED ORDER — GUAIFENESIN ER 600 MG PO TB12
600.0000 mg | ORAL_TABLET | Freq: Two times a day (BID) | ORAL | Status: DC
  Administered 2015-12-28 – 2016-01-01 (×10): 600 mg via ORAL
  Filled 2015-12-28 (×10): qty 1

## 2015-12-28 MED ORDER — VANCOMYCIN HCL IN DEXTROSE 750-5 MG/150ML-% IV SOLN
750.0000 mg | INTRAVENOUS | Status: DC
  Filled 2015-12-28: qty 150

## 2015-12-28 MED ORDER — ACETAMINOPHEN 650 MG RE SUPP: 650.0000 mg | Freq: Four times a day (QID) | RECTAL | Status: DC | PRN

## 2015-12-28 MED ORDER — FLUTICASONE FUROATE-VILANTEROL 200-25 MCG/INH IN AEPB
1.0000 | INHALATION_SPRAY | Freq: Every day | RESPIRATORY_TRACT | Status: DC
  Administered 2015-12-28 – 2016-01-01 (×5): 1 via RESPIRATORY_TRACT
  Filled 2015-12-28: qty 28

## 2015-12-28 MED ORDER — MOMETASONE FURO-FORMOTEROL FUM 200-5 MCG/ACT IN AERO
2.0000 | INHALATION_SPRAY | Freq: Two times a day (BID) | RESPIRATORY_TRACT | Status: DC
  Administered 2015-12-28 – 2016-01-01 (×10): 2 via RESPIRATORY_TRACT
  Filled 2015-12-28: qty 8.8

## 2015-12-28 MED ORDER — POTASSIUM CHLORIDE ER 10 MEQ PO TBCR
10.0000 meq | EXTENDED_RELEASE_TABLET | Freq: Two times a day (BID) | ORAL | Status: DC
  Administered 2015-12-28 – 2015-12-31 (×8): 10 meq via ORAL
  Filled 2015-12-28 (×19): qty 1

## 2015-12-28 MED ORDER — ONDANSETRON HCL 4 MG PO TABS: 4.0000 mg | ORAL_TABLET | Freq: Four times a day (QID) | ORAL | Status: DC | PRN

## 2015-12-28 MED ORDER — CEFAZOLIN SODIUM 1-5 GM-% IV SOLN
1.0000 g | Freq: Three times a day (TID) | INTRAVENOUS | Status: DC
  Administered 2015-12-28 – 2016-01-01 (×13): 1 g via INTRAVENOUS
  Filled 2015-12-28 (×16): qty 50

## 2015-12-28 NOTE — Telephone Encounter (Signed)
Transition Care Management Follow-up Telephone Call   Date discharged? 12/27/15   How have you been since you were released from the hospital? Pt states she is feeling alright   Do you understand why you were in the hospital? YES   Do you understand the discharge instructions? YES   Where were you discharged to? Home   Items Reviewed:  Medications reviewed: YES  Allergies reviewed: YES  Dietary changes reviewed: YES  Referrals reviewed: No referral needed   Functional Questionnaire:   Activities of Daily Living (ADLs):   She states she are independent in the following: ambulation, bathing and hygiene, feeding, continence, grooming, toileting and dressing States she doesn't require assistance    Any transportation issues/concerns?: NO   Any patient concerns? NO   Confirmed importance and date/time of follow-up visits scheduled YES, appt 01/03/16  Provider Appointment booked with [name]  Confirmed with patient if condition begins to worsen call PCP or go to the ER.  Patient was given the office number and encouraged to call back with question or concerns.  : YES

## 2015-12-28 NOTE — Progress Notes (Signed)
Chaplain presented to the patient's room to provide spiritual care support. Per a spiritual care consult it was noted that the recent death of the patient's husband of which she was the caretaker. Chaplain inquired as to whether the patient has a good support system to help her with the funeral arrangement of her husband, she reports that she does have family.  Chaplain offered words of encouragement, and a prayer of peace and comfort for the patient. She was appreciative of the visit, will continue to follow for support as needed. Chaplain Yaakov Guthrie [phone]

## 2015-12-28 NOTE — H&P (Signed)
[facility] History and Physical  Jasmine [name] [id number] DOB: July 25, 1946 DOA: 12/27/2015  Referring physician: Dr. Christy Gentles. PCP: [name], MD  Specialists: None.  Chief Complaint: Positive blood cultures.  HPI: Jasmine [name] is a 70 y.o. female who was recently admitted and discharged yesterday after being treated for COPD exacerbation was called back to the ER as patient had positive blood cultures with streptococcus viridans in one of the bottles. Patient presently states she feels much better than when she was admitted last time with shortness of breath largely improved. Denies any fever or chills night sweats headache visual symptoms or any joint pains. Repeat blood cultures have been obtained along with sedimentation rate and patient will be admitted for further management.   Review of Systems: As presented in the history of presenting illness, rest negative.  Past Medical History  Diagnosis Date  . ALLERGIC RHINITIS 05/01/2007  . ASTHMA 05/01/2007  . BACK PAIN 10/10/2008  . DEPRESSION 07/21/2008  . FATIGUE 10/10/2010  . HYPERTENSION 05/01/2007  . HYPERTHYROIDISM 11/23/2007    Pt endorses having had Graves disease, possibly radioactive iodine x1, but no thyroidectomy  . HYPOTHYROIDISM 08/23/2009  . LUMBAR RADICULOPATHY, RIGHT 08/25/2008  . OSTEOPOROSIS 05/01/2007  . SHOULDER PAIN, LEFT 08/23/2009  . SINUSITIS, CHRONIC 10/10/2010  . SINUSITIS- ACUTE-NOS 10/10/2008  . H/O blood clots     left leg 1960's  . CHF (congestive heart failure) [facility])    Past Surgical History  Procedure Laterality Date  . Appendectomy    . Ectopic pregnancy surgery    . Post fallopian tube removal right side    . History of multiple dialations and curettages and miscarriages, unfortunately never carrying a child to term    . Electrocardiogram  06/20/2006  . Tubal ligation     Social History:  reports that she quit smoking about 5 months ago. Her smoking use included Cigarettes. She has a 2  pack-year smoking history. She has never used smokeless tobacco. She reports that she drinks about 1.2 oz of alcohol per week. She reports that she uses illicit drugs (Cocaine) about twice per week. Where does patient live home. Can patient participate in ADLs? Yes.  Allergies  Allergen Reactions  . Alendronate Sodium Other (See Comments)    Pt does not remember this reaction    Family History:  Family History  Problem Relation Age of Onset  . Alcohol abuse Brother   . Diabetes Brother   . Hypertension Other   . Lung cancer Father   . Stroke Mother   . Asthma      maternal aunts  . Thyroid disease Mother       Prior to Admission medications   Medication Sig Start Date End Date Taking? Authorizing Provider  albuterol (PROVENTIL) (2.5 MG/3ML) 0.083% nebulizer solution Take 3 mLs (2.5 mg total) by nebulization every 4 (four) hours as needed for wheezing. 06/25/14  Yes Theodis Blaze, MD  ALPRAZolam Duanne Moron) 0.25 MG tablet TAKE 1 TABLET BY MOUTH TWICE DAILY 09/25/15  Yes Biagio Borg, MD  citalopram (CELEXA) 10 MG tablet Take 1 tablet (10 mg total) by mouth daily. 10/12/15  Yes Biagio Borg, MD  doxycycline (VIBRA-TABS) 100 MG tablet Take 1 tablet (100 mg total) by mouth 2 (two) times daily. x7 days 12/27/15  Yes Ripudeep K Rai, MD  fluticasone furoate-vilanterol (BREO ELLIPTA) 200-25 MCG/INH AEPB Inhale 1 puff into the lungs daily. 12/21/15  Yes Rigoberto Noel, MD  furosemide (LASIX) 40 MG tablet  Take 1 tablet (40 mg total) by mouth daily. 10/12/15  Yes Biagio Borg, MD  guaiFENesin (MUCINEX) 600 MG 12 hr tablet Take 1 tablet (600 mg total) by mouth 2 (two) times daily. 12/27/15  Yes Ripudeep Krystal Eaton, MD  levothyroxine (SYNTHROID, LEVOTHROID) 50 MCG tablet Take 1 tablet (50 mcg total) by mouth daily. 10/12/15  Yes Biagio Borg, MD  mometasone-formoterol [facility]) 200-5 MCG/ACT AERO Inhale 2 puffs into the lungs 2 (two) times daily. INHALE 2 PUFFS INTO THE LUNGS 2 (TWO) TIMES DAILY. 12/27/15  Yes  Ripudeep K Rai, MD  OXYGEN Inhale 4 L into the lungs continuous.    Yes Historical Provider, MD  potassium chloride (K-DUR) 10 MEQ tablet Take 1 tablet (10 mEq total) by mouth 2 (two) times daily. 10/12/15  Yes Biagio Borg, MD  tiotropium (SPIRIVA HANDIHALER) 18 MCG inhalation capsule Place 1 capsule (18 mcg total) into inhaler and inhale daily. 12/27/15  Yes Ripudeep Krystal Eaton, MD    Physical Exam: Filed Vitals:   12/27/15 2038 12/27/15 2145 12/27/15 2230 12/27/15 2330  BP:  120/57 119/60 125/68  Pulse:  96 89 84  Resp:  22 11 16   Weight: 86 lb 3.2 oz (39.1 kg)     SpO2:  98% 97% 100%     General:  Moderately built and poorly nourished.  Eyes: Anicteric no pallor.  ENT: No discharge from the ears eyes nose or mouth.  Neck: No JVD appreciated.  Cardiovascular: S1-S2 heard.  Respiratory: No rhonchi or crepitations.  Abdomen: Soft nontender bowel sounds present.  Skin: No rash.  Musculoskeletal: No edema.  Psychiatric: Appears normal.  Neurologic: Alert awake oriented to time place and person. Moves all extremities.  Labs on Admission:  Basic Metabolic Panel:  Recent Labs Lab 12/24/15 2000 12/24/15 2330 12/25/15 0428 12/26/15 0400 12/27/15 0341 12/27/15 1849  NA 139  --  138 135 140 140  K 4.1  --  4.4 4.6 4.4 4.9  CL 87*  --  81* 83* 82* 77*  CO2 42*  --  45* 42* 46* >50*  GLUCOSE 155*  --  169* 167* 93 200*  BUN 7  --  9 13 12 12   CREATININE 0.74  --  0.72 0.73 0.65 0.72  CALCIUM 9.2  --  8.9 8.6* 8.9 9.9  MG  --  1.5*  --  2.2  --   --   PHOS  --  3.1  --   --   --   --    Liver Function Tests:  Recent Labs Lab 12/24/15 2000 12/25/15 0428 12/26/15 0400 12/27/15 0341 12/27/15 1849  AST 27 26 19 18 26   ALT 27 25 20 19 25   ALKPHOS 65 62 58 55 75  BILITOT 0.4 0.4 0.3 0.5 0.4  PROT 6.8 6.2* 5.7* 6.1* 8.4*  ALBUMIN 3.2* 3.0* 2.9* 2.9* 3.9   No results for input(s): LIPASE, AMYLASE in the last 168 hours. No results for input(s): AMMONIA in the last  168 hours. CBC:  Recent Labs Lab 12/24/15 2026 12/25/15 0428 12/26/15 0400 12/27/15 0341 12/27/15 1850  WBC 5.1 3.7* 5.7 5.6 4.3  NEUTROABS  --  3.5 5.1 3.4 3.3  HGB 12.5 12.9 12.2 13.1 15.7*  HCT 40.8 42.4 39.9 42.3 52.4*  MCV 103.3* 102.7* 101.0* 104.4* 105.2*  PLT 194 203 205 214 249   Cardiac Enzymes: No results for input(s): CKTOTAL, CKMB, CKMBINDEX, TROPONINI in the last 168 hours.  BNP (last 3 results)  Recent  Labs  12/24/15 2000  BNP 26.7    ProBNP (last 3 results) No results for input(s): PROBNP in the last 8760 hours.  CBG: No results for input(s): GLUCAP in the last 168 hours.  Radiological Exams on Admission: Dg Chest 2 View  12/27/2015  CLINICAL DATA:  Abnormal labs. EXAM: CHEST  2 VIEW COMPARISON:  12/24/2015 FINDINGS: Lungs are hyperinflated. There prominence of interstitial markings throughout the lungs, unchanged since prior studies. Perihilar peribronchial thickening is noted. There are no focal consolidations. More confluent opacity is identified in the left upper lobe. Is difficult to exclude a pulmonary nodule. No evidence for pulmonary edema. IMPRESSION: Chronic fibrotic changes. Possible left upper lobe pulmonary nodule. Consider further evaluation with CT of the chest. Intravenous contrast is recommended unless it is contraindicated. Electronically Signed   By: Nolon Nations M.D.   On: 12/27/2015 18:20     Assessment/Plan Principal Problem:   Bacteremia Step viridans Active Problems:   Essential hypertension   COPD (chronic obstructive pulmonary disease) (HCC)   Hypothyroidism   1. Bacteremia with Streptococcus viridans - one of the bottles were positive for Streptococcus viridans bacteremia during last admission. Repeat blood cultures and sedimentation rate have been ordered. Check 2-D echo. For now patient has been placed on vancomycin. Consult infectious disease in a.m. 2. Recent admission for COPD exacerbation - appears to have resolved.  Continue home inhalers. 3. Diastolic CHF with last EF measured in 2015 was 55-60% with grade 1 diastolic dysfunction - appears compensated. Continue Lasix 40 mg daily. 4. Hypothyroidism on Synthroid. 5. Hyperglycemia probably from recent steroid use.   DVT Prophylaxis Lovenox.  Code Status: Full code.  Family Communication: Discussed with patient.  Disposition Plan: Admit to inpatient.    Armoni Kludt N. [facility] Pager [phone].  If 7PM-7AM, please contact night-coverage [email or web address] Password [email or web address] 12/28/2015, 12:01 AM

## 2015-12-28 NOTE — Progress Notes (Signed)
Pharmacy Antibiotic Note  Jasmine [name] is a 70 y.o. female admitted on 12/27/2015 with staph aureus and strep viridans bacteremia (growing in 1/2 blood cultures from 3/6). Pt discharged from hospital yesterday -discharged home on Doxy for COPD exacerbation. Pharmacy has been consulted for Vancomycin dosing. Pt received Vanc 1gm in ED ~2000.   Plan: Vancomycin 750mg  IV q24h Will f/u micro data, renal function, and pt's clinical condition Vanc trough at Css F/u ID consult  Weight: 86 lb 3.2 oz (39.1 kg)  Temp (24hrs), Avg:98.6 F (37 C), Min:98.4 F (36.9 C), Max:98.7 F (37.1 C)   Recent Labs Lab 12/24/15 2000 12/24/15 2026 12/24/15 2040 12/24/15 2357 12/25/15 0428 12/26/15 0400 12/27/15 0341 12/27/15 1849 12/27/15 1850 12/27/15 2051  WBC  --  5.1  --   --  3.7* 5.7 5.6  --  4.3  --   CREATININE 0.74  --   --   --  0.72 0.73 0.65 0.72  --   --   LATICACIDVEN  --   --  1.59 2.05*  --   --   --  3.56*  --  1.88    Estimated Creatinine Clearance: 39.8 mL/min (by C-G formula based on Cr of 0.72).    Allergies  Allergen Reactions  . Alendronate Sodium Other (See Comments)    Pt does not remember this reaction    Antimicrobials this admission: 3/10 Vanc >>   Dose adjustments this admission: n/a  Microbiology results: 3/10 UCx:  3/9 BCx x 2: 3/6 BCx x2: SA and strep viridans in 1/2 3/7 UCx: insignficant growth  3/6 MRSA PCR: neg   Thank you for allowing pharmacy to be a part of this patient's care.  Sherlon Handing, PharmD, BCPS Clinical pharmacist, pager [phone] 12/28/2015 1:12 AM

## 2015-12-28 NOTE — Consult Note (Signed)
[facility] for Infectious Disease    Date of Admission:  12/27/2015    Total days of antibiotics 4        Day 1 cefazolin              Reason for Consult: Automatic consultation for staph aureus bacteremia    Referring Physician: Dr. Gean Birchwood  Principal Problem:   Staphylococcus aureus bacteremia Active Problems:   Bacteremia Step viridans   Essential hypertension   COPD (chronic obstructive pulmonary disease) ([place])   Hypothyroidism   . ALPRAZolam  0.25 mg Oral BID  . antiseptic oral rinse  7 mL Mouth Rinse BID  .  ceFAZolin (ANCEF) IV  1 g Intravenous 3 times per day  . citalopram  10 mg Oral Daily  . enoxaparin (LOVENOX) injection  20 mg Subcutaneous Q24H  . fluticasone furoate-vilanterol  1 puff Inhalation Daily  . furosemide  40 mg Oral Daily  . guaiFENesin  600 mg Oral BID  . levothyroxine  50 mcg Oral QAC breakfast  . mometasone-formoterol  2 puff Inhalation BID  . potassium chloride  10 mEq Oral BID  . tiotropium  18 mcg Inhalation Daily  . vancomycin  750 mg Intravenous Q24H    Recommendations: 1. Continue cefazolin 2. Await results of repeat blood cultures and transthoracic echocardiogram   Assessment: She had a transient bacteremia with MSSA and viridans strep in the setting of an acute exacerbation of chronic bronchitis. I would continue cefazolin and await results of repeat blood cultures and TTE. This probably warrants at least 10-14 days of IV antibiotics. She has no clinical evidence of endocarditis. If there is no evidence by TTE but it may not be necessary to pursue TEE if only one of her blood cultures is positive.   HPI: Jasmine [name] is a 70 y.o. female who is oxygen dependent with COPD. She had been caring for her ill husband (who passed away 01-04-2016) and begin to develop severe fatigue, worsening cough, white sputum production and worsening shortness of breath leading to admission on 12/25/2015. She was treated for an acute  exacerbation of chronic bronchitis. She was started on levofloxacin and then switched to doxycycline and discharged yesterday feeling better. She was called back after one of 2 admission blood cultures was found to be growing staph aureus and viridans strep. Overall, she is feeling better but still extremely weak. Her shortness of breath has improved but she still has a cough productive of white sputum.  Review of Systems: Review of Systems  Constitutional: Positive for fever and malaise/fatigue. Negative for chills, weight loss and diaphoresis.       She had one fever spike when admitted 12/25/2015.  HENT: Negative for sore throat.   Respiratory: Positive for cough, sputum production, shortness of breath and wheezing.   Cardiovascular: Negative for chest pain.  Gastrointestinal: Negative for nausea, vomiting and diarrhea.  Genitourinary: Negative for dysuria.  Musculoskeletal: Negative for myalgias and joint pain.  Skin: Negative for rash.  Neurological: Negative for headaches.    Past Medical History  Diagnosis Date  . ALLERGIC RHINITIS 05/01/2007  . ASTHMA 05/01/2007  . BACK PAIN 10/10/2008  . DEPRESSION 07/21/2008  . FATIGUE 10/10/2010  . HYPERTENSION 05/01/2007  . HYPERTHYROIDISM 11/23/2007    Pt endorses having had Graves disease, possibly radioactive iodine x1, but no thyroidectomy  . HYPOTHYROIDISM 08/23/2009  . LUMBAR RADICULOPATHY, RIGHT 08/25/2008  . OSTEOPOROSIS 05/01/2007  . SHOULDER PAIN,  LEFT 08/23/2009  . SINUSITIS, CHRONIC 10/10/2010  . SINUSITIS- ACUTE-NOS 10/10/2008  . H/O blood clots     left leg 1960's  . CHF (congestive heart failure) ([place])     Social History  Substance Use Topics  . Smoking status: Former Smoker -- 0.05 packs/day for 40 years    Types: Cigarettes    Quit date: 07/21/2015  . Smokeless tobacco: Never Used     Comment: 2-3 cigs/daily  . Alcohol Use: 1.2 oz/week    2 Cans of beer per week     Comment: 2 beers a day, occasional liquor, ?  minimizing    Family History  Problem Relation Age of Onset  . Alcohol abuse Brother   . Diabetes Brother   . Hypertension Other   . Lung cancer Father   . Stroke Mother   . Asthma      maternal aunts  . Thyroid disease Mother    Allergies  Allergen Reactions  . Alendronate Sodium Other (See Comments)    Pt does not remember this reaction    OBJECTIVE: Blood pressure 104/62, pulse 81, temperature 97.7 F (36.5 C), temperature source Oral, resp. rate 18, height 5' (1.524 m), weight 89 lb 15.2 oz (40.8 kg), SpO2 100 %.  Physical Exam  Constitutional: She is oriented to person, place, and time.  She is very thin and weak but otherwise in no distress.  HENT:  Mouth/Throat: No oropharyngeal exudate.  Eyes: Conjunctivae are normal.  Cardiovascular: Normal rate and regular rhythm.   No murmur heard. Pulmonary/Chest: Effort normal. No respiratory distress. She has no wheezes. She has rales.  Abdominal: Soft. There is no tenderness.  Musculoskeletal: Normal range of motion.  She has 1+ pitting edema of her left lower leg. All joints appear normal.  Neurological: She is alert and oriented to person, place, and time.  Skin: No rash noted.  Psychiatric: Mood and affect normal.    Lab Results Lab Results  Component Value Date   WBC 5.0 12/28/2015   HGB 12.6 12/28/2015   HCT 43.3 12/28/2015   MCV 104.8* 12/28/2015   PLT 226 12/28/2015    Lab Results  Component Value Date   CREATININE 0.57 12/28/2015   BUN 9 12/28/2015   NA 141 12/28/2015   K 4.7 12/28/2015   CL 85* 12/28/2015   CO2 47* 12/28/2015    Lab Results  Component Value Date   ALT 18 12/28/2015   AST 21 12/28/2015   ALKPHOS 54 12/28/2015   BILITOT 0.5 12/28/2015     Microbiology: Recent Results (from the past 240 hour(s))  Blood Culture (routine x 2)     Status: None   Collection Time: 12/24/15  8:00 PM  Result Value Ref Range Status   Specimen Description BLOOD LEFT ANTECUBITAL  Final   Special  Requests IN PEDIATRIC BOTTLE 3CC  Final   Culture  Setup Time   Final    GRAM POSITIVE COCCI IN CHAINS AEROBIC BOTTLE ONLY CRITICAL RESULT CALLED TO, READ BACK BY AND VERIFIED WITH: C [place] 12/25/15 @ 84 M VESTAL    Culture   Final    VIRIDANS STREPTOCOCCUS THE SIGNIFICANCE OF ISOLATING THIS ORGANISM FROM A SINGLE SET OF BLOOD CULTURES WHEN MULTIPLE SETS ARE DRAWN IS UNCERTAIN. PLEASE NOTIFY THE MICROBIOLOGY DEPARTMENT WITHIN ONE WEEK IF SPECIATION AND SENSITIVITIES ARE REQUIRED. STAPHYLOCOCCUS AUREUS    Report Status 12/28/2015 FINAL  Final   Organism ID, Bacteria STAPHYLOCOCCUS AUREUS  Final      Susceptibility  Staphylococcus aureus - MIC*    CIPROFLOXACIN <=0.5 SENSITIVE Sensitive     ERYTHROMYCIN >=8 RESISTANT Resistant     GENTAMICIN <=0.5 SENSITIVE Sensitive     OXACILLIN <=0.25 SENSITIVE Sensitive     TETRACYCLINE <=1 SENSITIVE Sensitive     VANCOMYCIN <=0.5 SENSITIVE Sensitive     TRIMETH/SULFA <=10 SENSITIVE Sensitive     CLINDAMYCIN <=0.25 SENSITIVE Sensitive     RIFAMPIN <=0.5 SENSITIVE Sensitive     Inducible Clindamycin NEGATIVE Sensitive     * STAPHYLOCOCCUS AUREUS  Blood Culture (routine x 2)     Status: None (Preliminary result)   Collection Time: 12/24/15  8:15 PM  Result Value Ref Range Status   Specimen Description BLOOD RIGHT ARM  Final   Special Requests IN PEDIATRIC BOTTLE 4CC  Final   Culture NO GROWTH 3 DAYS  Final   Report Status PENDING  Incomplete  Urine culture     Status: None   Collection Time: 12/24/15  9:09 PM  Result Value Ref Range Status   Specimen Description URINE, CLEAN CATCH  Final   Special Requests NONE  Final   Culture MULTIPLE SPECIES PRESENT, SUGGEST RECOLLECTION  Final   Report Status 12/26/2015 FINAL  Final  MRSA PCR Screening     Status: None   Collection Time: 12/25/15 12:41 AM  Result Value Ref Range Status   MRSA by PCR NEGATIVE NEGATIVE Final    Comment:        The GeneXpert MRSA Assay (FDA approved for NASAL  specimens only), is one component of a comprehensive MRSA colonization surveillance program. It is not intended to diagnose MRSA infection nor to guide or monitor treatment for MRSA infections.     Michel Bickers, MD [facility] [phone] pager   [phone] cell 12/28/2015, 11:31 AM

## 2015-12-28 NOTE — Progress Notes (Signed)
PROGRESS NOTE  Jasmine [name] TIW:[id number] DOB: Apr 22, 1946 DOA: 12/27/2015 PCP: [name], MD  HPI/Recap of past 24 hours:  Denies pain, no fever, very frail and thin  Assessment/Plan: Principal Problem:   Staphylococcus aureus bacteremia Active Problems:   Essential hypertension   COPD (chronic obstructive pulmonary disease) (HCC)   Bacteremia Step viridans   Hypothyroidism  1. Bacteremia with Streptococcus viridans - one of the bottles were positive for Streptococcus viridans bacteremia ( blood culture obtained from 3/6 during last admission). Repeat blood cultures pending, esr wnl. 2-D echo pending. s/p vancomycin x1 on 3/9, now on ancef per infectious disease recommendations.  infectious disease input appreciated.  2. Recent admission for COPD exacerbation - appears to have resolved. Continue home inhalers. 3. Diastolic CHF with last EF measured in 2015 was 55-60% with grade 1 diastolic dysfunction - appears compensated. Continue Lasix 40 mg daily. 4. Hypothyroidism on Synthroid. 5. Hyperglycemia probably from recent steroid use. 6. Sever malnutrition, significantly underweight, nutrition consult.  Code Status: full  Family Communication: patient  And her niece who is going to be her main care giver, (patient's husband Jasmine [name] just past away a few days ago)  Disposition Plan: pending blood culture   Consultants:  Infectious disease ( auto consult)  Procedures:  none  Antibiotics:  vanc x1 on 3/9  Ancef from 3/9   Objective: BP 110/58 mmHg  Pulse 80  Temp(Src) 98 F (36.7 C) (Oral)  Resp 18  Ht 5' (1.524 m)  Wt 40.8 kg (89 lb 15.2 oz)  BMI 17.57 kg/m2  SpO2 98%  Intake/Output Summary (Last 24 hours) at 12/28/15 1740 Last data filed at 12/28/15 1534  Gross per 24 hour  Intake    980 ml  Output   1700 ml  Net   -720 ml   Filed Weights   12/27/15 1730 12/27/15 2038 12/28/15 0120  Weight: 39.236 kg (86 lb 8 oz) 39.1 kg (86 lb 3.2 oz) 40.8 kg  (89 lb 15.2 oz)    Exam:   General:  NAD, cachactic   Cardiovascular: RRR  Respiratory: CTABL  Abdomen: Soft/ND/NT, positive BS  Musculoskeletal: No Edema  Neuro: aaox3  Data Reviewed: Basic Metabolic Panel:  Recent Labs Lab 12/24/15 2330 12/25/15 0428 12/26/15 0400 12/27/15 0341 12/27/15 1849 12/28/15 0501  NA  --  138 135 140 140 141  K  --  4.4 4.6 4.4 4.9 4.7  CL  --  81* 83* 82* 77* 85*  CO2  --  45* 42* 46* >50* 47*  GLUCOSE  --  169* 167* 93 200* 84  BUN  --  9 13 12 12 9   CREATININE  --  0.72 0.73 0.65 0.72 0.57  CALCIUM  --  8.9 8.6* 8.9 9.9 9.0  MG 1.5*  --  2.2  --   --   --   PHOS 3.1  --   --   --   --   --    Liver Function Tests:  Recent Labs Lab 12/25/15 0428 12/26/15 0400 12/27/15 0341 12/27/15 1849 12/28/15 0501  AST 26 19 18 26 21   ALT 25 20 19 25 18   ALKPHOS 62 58 55 75 54  BILITOT 0.4 0.3 0.5 0.4 0.5  PROT 6.2* 5.7* 6.1* 8.4* 6.1*  ALBUMIN 3.0* 2.9* 2.9* 3.9 2.8*   No results for input(s): LIPASE, AMYLASE in the last 168 hours. No results for input(s): AMMONIA in the last 168 hours. CBC:  Recent Labs Lab 12/25/15 0428  12/26/15 0400 12/27/15 0341 12/27/15 1850 12/28/15 0501  WBC 3.7* 5.7 5.6 4.3 5.0  NEUTROABS 3.5 5.1 3.4 3.3 2.3  HGB 12.9 12.2 13.1 15.7* 12.6  HCT 42.4 39.9 42.3 52.4* 43.3  MCV 102.7* 101.0* 104.4* 105.2* 104.8*  PLT 203 205 214 249 226   Cardiac Enzymes:   No results for input(s): CKTOTAL, CKMB, CKMBINDEX, TROPONINI in the last 168 hours. BNP (last 3 results)  Recent Labs  12/24/15 2000  BNP 26.7    ProBNP (last 3 results) No results for input(s): PROBNP in the last 8760 hours.  CBG: No results for input(s): GLUCAP in the last 168 hours.  Recent Results (from the past 240 hour(s))  Blood Culture (routine x 2)     Status: None   Collection Time: 12/24/15  8:00 PM  Result Value Ref Range Status   Specimen Description BLOOD LEFT ANTECUBITAL  Final   Special Requests IN PEDIATRIC BOTTLE  3CC  Final   Culture  Setup Time   Final    GRAM POSITIVE COCCI IN CHAINS AEROBIC BOTTLE ONLY CRITICAL RESULT CALLED TO, READ BACK BY AND VERIFIED WITH: C Anderson 12/25/15 @ 44 M VESTAL    Culture   Final    VIRIDANS STREPTOCOCCUS THE SIGNIFICANCE OF ISOLATING THIS ORGANISM FROM A SINGLE SET OF BLOOD CULTURES WHEN MULTIPLE SETS ARE DRAWN IS UNCERTAIN. PLEASE NOTIFY THE MICROBIOLOGY DEPARTMENT WITHIN ONE WEEK IF SPECIATION AND SENSITIVITIES ARE REQUIRED. STAPHYLOCOCCUS AUREUS    Report Status 12/28/2015 FINAL  Final   Organism ID, Bacteria STAPHYLOCOCCUS AUREUS  Final      Susceptibility   Staphylococcus aureus - MIC*    CIPROFLOXACIN <=0.5 SENSITIVE Sensitive     ERYTHROMYCIN >=8 RESISTANT Resistant     GENTAMICIN <=0.5 SENSITIVE Sensitive     OXACILLIN <=0.25 SENSITIVE Sensitive     TETRACYCLINE <=1 SENSITIVE Sensitive     VANCOMYCIN <=0.5 SENSITIVE Sensitive     TRIMETH/SULFA <=10 SENSITIVE Sensitive     CLINDAMYCIN <=0.25 SENSITIVE Sensitive     RIFAMPIN <=0.5 SENSITIVE Sensitive     Inducible Clindamycin NEGATIVE Sensitive     * STAPHYLOCOCCUS AUREUS  Blood Culture (routine x 2)     Status: None (Preliminary result)   Collection Time: 12/24/15  8:15 PM  Result Value Ref Range Status   Specimen Description BLOOD RIGHT ARM  Final   Special Requests IN PEDIATRIC BOTTLE 4CC  Final   Culture NO GROWTH 4 DAYS  Final   Report Status PENDING  Incomplete  Urine culture     Status: None   Collection Time: 12/24/15  9:09 PM  Result Value Ref Range Status   Specimen Description URINE, CLEAN CATCH  Final   Special Requests NONE  Final   Culture MULTIPLE SPECIES PRESENT, SUGGEST RECOLLECTION  Final   Report Status 12/26/2015 FINAL  Final  MRSA PCR Screening     Status: None   Collection Time: 12/25/15 12:41 AM  Result Value Ref Range Status   MRSA by PCR NEGATIVE NEGATIVE Final    Comment:        The GeneXpert MRSA Assay (FDA approved for NASAL specimens only), is one component  of a comprehensive MRSA colonization surveillance program. It is not intended to diagnose MRSA infection nor to guide or monitor treatment for MRSA infections.   Culture, blood (routine x 2)     Status: None (Preliminary result)   Collection Time: 12/27/15  6:37 PM  Result Value Ref Range Status   Specimen Description BLOOD  LEFT ANTECUBITAL  Final   Special Requests   Final    BOTTLES DRAWN AEROBIC AND ANAEROBIC BLUE 10CC RED 5CC   Culture NO GROWTH < 24 HOURS  Final   Report Status PENDING  Incomplete  Culture, blood (routine x 2)     Status: None (Preliminary result)   Collection Time: 12/27/15  7:44 PM  Result Value Ref Range Status   Specimen Description BLOOD LEFT FOREARM  Final   Special Requests BOTTLES DRAWN AEROBIC AND ANAEROBIC 5CC  Final   Culture NO GROWTH < 24 HOURS  Final   Report Status PENDING  Incomplete     Studies: Dg Chest 2 View  12/27/2015  CLINICAL DATA:  Abnormal labs. EXAM: CHEST  2 VIEW COMPARISON:  12/24/2015 FINDINGS: Lungs are hyperinflated. There prominence of interstitial markings throughout the lungs, unchanged since prior studies. Perihilar peribronchial thickening is noted. There are no focal consolidations. More confluent opacity is identified in the left upper lobe. Is difficult to exclude a pulmonary nodule. No evidence for pulmonary edema. IMPRESSION: Chronic fibrotic changes. Possible left upper lobe pulmonary nodule. Consider further evaluation with CT of the chest. Intravenous contrast is recommended unless it is contraindicated. Electronically Signed   By: Nolon Nations M.D.   On: 12/27/2015 18:20    Scheduled Meds: . ALPRAZolam  0.25 mg Oral BID  . antiseptic oral rinse  7 mL Mouth Rinse BID  .  ceFAZolin (ANCEF) IV  1 g Intravenous 3 times per day  . citalopram  10 mg Oral Daily  . enoxaparin (LOVENOX) injection  20 mg Subcutaneous Q24H  . fluticasone furoate-vilanterol  1 puff Inhalation Daily  . furosemide  40 mg Oral Daily  .  guaiFENesin  600 mg Oral BID  . levothyroxine  50 mcg Oral QAC breakfast  . mometasone-formoterol  2 puff Inhalation BID  . potassium chloride  10 mEq Oral BID  . tiotropium  18 mcg Inhalation Daily    Continuous Infusions:    Time spent: 62mns from 4:15 to 4:45pm  Hershall Benkert MD, PhD  [facility] Pager [phone] If 7PM-7AM, please contact night-coverage at [email or web address], password [email or web address]3/07/2016, 5:40 PM  LOS: 1 day

## 2015-12-28 NOTE — Progress Notes (Signed)
Pharmacy Antibiotic Note  Jasmine [name] is a 70 y.o. female admitted on 12/27/2015 with bacteremia.  Pharmacy has been consulted for Cefazolin dosing.  Plan: Pt readmitted with with 1/2 blood cx from 3/6 (+) Staph aureus and Strep viridans.  Repeat cx have been drawn, to add Cefazolin in addition to Vancomycin per ID.  CrCl 35-71ml/min.    1-  Cefazolin 1g IV q8 2-  F/U culture results  Height: 5' (152.4 cm) Weight: 89 lb 15.2 oz (40.8 kg) IBW/kg (Calculated) : 45.5  Temp (24hrs), Avg:98.2 F (36.8 C), Min:97.7 F (36.5 C), Max:98.7 F (37.1 C)   Recent Labs Lab 12/24/15 2040 12/24/15 2357 12/25/15 0428 12/26/15 0400 12/27/15 0341 12/27/15 1849 12/27/15 1850 12/27/15 2051 12/28/15 0501  WBC  --   --  3.7* 5.7 5.6  --  4.3  --  5.0  CREATININE  --   --  0.72 0.73 0.65 0.72  --   --  0.57  LATICACIDVEN 1.59 2.05*  --   --   --  3.56*  --  1.88  --     Estimated Creatinine Clearance: 41.5 mL/min (by C-G formula based on Cr of 0.57).    Allergies  Allergen Reactions  . Alendronate Sodium Other (See Comments)    Pt does not remember this reaction    Antimicrobials this admission: Vanc 3/10 >>  Cefazolin 3/10 >>    Microbiology results: 3/10 UCx:  3/9 BCx x 2: 3/6 BCx x2: SA and strep viridans in 1/2 3/7 UCx: insignficant growth  3/6 MRSA PCR: neg   Thank you for allowing pharmacy to be a part of this patient's care.  Gracy Bruins, PharmD Clinical Pharmacist [facility]

## 2015-12-28 NOTE — Progress Notes (Signed)
  Echocardiogram 2D Echocardiogram has been performed.  Jasmine Charleston 12/28/2015, 2:45 PM

## 2015-12-28 NOTE — Progress Notes (Signed)
Received patient from ED. Patient AOx4, VS stable and skin is intact but dry and scaly.  CNA oriented patient to room, bed controls, call light and provided cranberry juice, graham crackers and ice cream to eat. Patient very pleasant and resting on bed comfortably watching TV.

## 2015-12-29 LAB — CBC
HCT: 41.2 % (ref 36.0–46.0)
HEMOGLOBIN: 12.3 g/dL (ref 12.0–15.0)
MCH: 30.8 pg (ref 26.0–34.0)
MCHC: 29.9 g/dL — AB (ref 30.0–36.0)
MCV: 103 fL — AB (ref 78.0–100.0)
PLATELETS: 199 10*3/uL (ref 150–400)
RBC: 4 MIL/uL (ref 3.87–5.11)
RDW: 14.5 % (ref 11.5–15.5)
WBC: 5.1 10*3/uL (ref 4.0–10.5)

## 2015-12-29 LAB — BASIC METABOLIC PANEL
Anion gap: 9 (ref 5–15)
BUN: 10 mg/dL (ref 6–20)
CHLORIDE: 85 mmol/L — AB (ref 101–111)
CO2: 48 mmol/L — ABNORMAL HIGH (ref 22–32)
CREATININE: 0.59 mg/dL (ref 0.44–1.00)
Calcium: 9.1 mg/dL (ref 8.9–10.3)
GFR calc Af Amer: >60 mL/min (ref >60)
GFR calc non Af Amer: >60 mL/min (ref >60)
GLUCOSE: 103 mg/dL — AB (ref 65–99)
Potassium: 4.7 mmol/L (ref 3.5–5.1)
SODIUM: 142 mmol/L (ref 135–145)

## 2015-12-29 LAB — CULTURE, BLOOD (ROUTINE X 2): CULTURE: NO GROWTH

## 2015-12-29 LAB — MAGNESIUM: MAGNESIUM: 1.8 mg/dL (ref 1.7–2.4)

## 2015-12-29 LAB — URINE CULTURE: Culture: 1000

## 2015-12-29 MED ORDER — ENSURE ENLIVE PO LIQD
237.0000 mL | Freq: Three times a day (TID) | ORAL | Status: DC
  Administered 2015-12-29 – 2016-01-01 (×9): 237 mL via ORAL

## 2015-12-29 NOTE — Progress Notes (Signed)
INFECTIOUS DISEASE PROGRESS NOTE  ID: Jasmine [name] is a 70 y.o. female with  Principal Problem:   Staphylococcus aureus bacteremia Active Problems:   Essential hypertension   COPD (chronic obstructive pulmonary disease) (HCC)   Bacteremia Step viridans   Hypothyroidism  Subjective: Resting quietly  Abtx:  Anti-infectives    Start     Dose/Rate Route Frequency Ordered Stop   12/28/15 2000  vancomycin (VANCOCIN) IVPB 750 mg/150 ml premix  Status:  Discontinued     750 mg 150 mL/hr over 60 Minutes Intravenous Every 24 hours 12/28/15 0123 12/28/15 1140   12/28/15 0900  ceFAZolin (ANCEF) IVPB 1 g/50 mL premix     1 g 100 mL/hr over 30 Minutes Intravenous 3 times per day 12/28/15 0841     12/27/15 1915  vancomycin (VANCOCIN) IVPB 1000 mg/200 mL premix     1,000 mg 200 mL/hr over 60 Minutes Intravenous  Once 12/27/15 1909 12/27/15 2233      Medications:  Scheduled: . ALPRAZolam  0.25 mg Oral BID  . antiseptic oral rinse  7 mL Mouth Rinse BID  .  ceFAZolin (ANCEF) IV  1 g Intravenous 3 times per day  . citalopram  10 mg Oral Daily  . enoxaparin (LOVENOX) injection  20 mg Subcutaneous Q24H  . fluticasone furoate-vilanterol  1 puff Inhalation Daily  . furosemide  40 mg Oral Daily  . guaiFENesin  600 mg Oral BID  . levothyroxine  50 mcg Oral QAC breakfast  . mometasone-formoterol  2 puff Inhalation BID  . potassium chloride  10 mEq Oral BID  . tiotropium  18 mcg Inhalation Daily    Objective: Vital signs in last 24 hours: Temp:  [98 F (36.7 C)-98.7 F (37.1 C)] 98.7 F (37.1 C) (03/11 0642) Pulse Rate:  [70-85] 70 (03/11 0642) Resp:  [18] 18 (03/11 0642) BP: (105-111)/(55-61) 105/55 mmHg (03/11 0642) SpO2:  [98 %-100 %] 99 % (03/11 0642)   General appearance: fatigued and no distress Resp: clear to auscultation bilaterally Cardio: regular rate and rhythm GI: normal findings: bowel sounds normal and soft, non-tender  Lab Results  Recent Labs   12/28/15 0501 12/29/15 0518  WBC 5.0 5.1  HGB 12.6 12.3  HCT 43.3 41.2  NA 141 142  K 4.7 4.7  CL 85* 85*  CO2 47* 48*  BUN 9 10  CREATININE 0.57 0.59   Liver Panel  Recent Labs  12/27/15 1849 12/28/15 0501  PROT 8.4* 6.1*  ALBUMIN 3.9 2.8*  AST 26 21  ALT 25 18  ALKPHOS 75 54  BILITOT 0.4 0.5   Sedimentation Rate  Recent Labs  12/28/15 0501  ESRSEDRATE 9   C-Reactive Protein No results for input(s): CRP in the last 72 hours.  Microbiology: Recent Results (from the past 240 hour(s))  Blood Culture (routine x 2)     Status: None   Collection Time: 12/24/15  8:00 PM  Result Value Ref Range Status   Specimen Description BLOOD LEFT ANTECUBITAL  Final   Special Requests IN PEDIATRIC BOTTLE 3CC  Final   Culture  Setup Time   Final    GRAM POSITIVE COCCI IN CHAINS AEROBIC BOTTLE ONLY CRITICAL RESULT CALLED TO, READ BACK BY AND VERIFIED WITH: C Moffat 12/25/15 @ 34 M VESTAL    Culture   Final    VIRIDANS STREPTOCOCCUS THE SIGNIFICANCE OF ISOLATING THIS ORGANISM FROM A SINGLE SET OF BLOOD CULTURES WHEN MULTIPLE SETS ARE DRAWN IS UNCERTAIN. PLEASE NOTIFY [place]  WITHIN ONE WEEK IF SPECIATION AND SENSITIVITIES ARE REQUIRED. STAPHYLOCOCCUS AUREUS    Report Status 12/28/2015 FINAL  Final   Organism ID, Bacteria STAPHYLOCOCCUS AUREUS  Final      Susceptibility   Staphylococcus aureus - MIC*    CIPROFLOXACIN <=0.5 SENSITIVE Sensitive     ERYTHROMYCIN >=8 RESISTANT Resistant     GENTAMICIN <=0.5 SENSITIVE Sensitive     OXACILLIN <=0.25 SENSITIVE Sensitive     TETRACYCLINE <=1 SENSITIVE Sensitive     VANCOMYCIN <=0.5 SENSITIVE Sensitive     TRIMETH/SULFA <=10 SENSITIVE Sensitive     CLINDAMYCIN <=0.25 SENSITIVE Sensitive     RIFAMPIN <=0.5 SENSITIVE Sensitive     Inducible Clindamycin NEGATIVE Sensitive     * STAPHYLOCOCCUS AUREUS  Blood Culture (routine x 2)     Status: None (Preliminary result)   Collection Time: 12/24/15  8:15 PM  Result  Value Ref Range Status   Specimen Description BLOOD RIGHT ARM  Final   Special Requests IN PEDIATRIC BOTTLE 4CC  Final   Culture NO GROWTH 4 DAYS  Final   Report Status PENDING  Incomplete  Urine culture     Status: None   Collection Time: 12/24/15  9:09 PM  Result Value Ref Range Status   Specimen Description URINE, CLEAN CATCH  Final   Special Requests NONE  Final   Culture MULTIPLE SPECIES PRESENT, SUGGEST RECOLLECTION  Final   Report Status 12/26/2015 FINAL  Final  MRSA PCR Screening     Status: None   Collection Time: 12/25/15 12:41 AM  Result Value Ref Range Status   MRSA by PCR NEGATIVE NEGATIVE Final    Comment:        The GeneXpert MRSA Assay (FDA approved for NASAL specimens only), is one component of a comprehensive MRSA colonization surveillance program. It is not intended to diagnose MRSA infection nor to guide or monitor treatment for MRSA infections.   Culture, blood (routine x 2)     Status: None (Preliminary result)   Collection Time: 12/27/15  6:37 PM  Result Value Ref Range Status   Specimen Description BLOOD LEFT ANTECUBITAL  Final   Special Requests   Final    BOTTLES DRAWN AEROBIC AND ANAEROBIC BLUE 10CC RED 5CC   Culture NO GROWTH < 24 HOURS  Final   Report Status PENDING  Incomplete  Culture, blood (routine x 2)     Status: None (Preliminary result)   Collection Time: 12/27/15  7:44 PM  Result Value Ref Range Status   Specimen Description BLOOD LEFT FOREARM  Final   Special Requests BOTTLES DRAWN AEROBIC AND ANAEROBIC 5CC  Final   Culture NO GROWTH < 24 HOURS  Final   Report Status PENDING  Incomplete  Urine culture     Status: None   Collection Time: 12/28/15 12:49 AM  Result Value Ref Range Status   Specimen Description URINE, RANDOM  Final   Special Requests NONE  Final   Culture 1,000 COLONIES/mL INSIGNIFICANT GROWTH  Final   Report Status 12/29/2015 FINAL  Final    Studies/Results: Dg Chest 2 View  12/27/2015  CLINICAL DATA:  Abnormal  labs. EXAM: CHEST  2 VIEW COMPARISON:  12/24/2015 FINDINGS: Lungs are hyperinflated. There prominence of interstitial markings throughout the lungs, unchanged since prior studies. Perihilar peribronchial thickening is noted. There are no focal consolidations. More confluent opacity is identified in the left upper lobe. Is difficult to exclude a pulmonary nodule. No evidence for pulmonary edema. IMPRESSION: Chronic fibrotic changes. Possible left  upper lobe pulmonary nodule. Consider further evaluation with CT of the chest. Intravenous contrast is recommended unless it is contraindicated. Electronically Signed   By: Nolon Nations M.D.   On: 12/27/2015 18:20     Assessment/Plan: Bacteremia (MSSA, viridans strep) Recent chronic bronchitis exacerbation  WBC normal, afebrile Continue ancef Await repeat BCx Await TTE  Will check her chart peripherally tomorrow, available if questions Total days of antibiotics:  2 (ancef)         Bobby Rumpf Infectious Diseases (pager) [phone] [email or web address] 12/29/2015, 12:36 PM  LOS: 2 days

## 2015-12-29 NOTE — Progress Notes (Signed)
PROGRESS NOTE  Jasmine [name] [id number]:[id number] DOB: 09-30-1946 DOA: 12/27/2015 PCP: [name], MD  HPI/Recap of past 24 hours:  Sitting in bed , having lunch, Denies pain, no fever,   Assessment/Plan: Principal Problem:   Staphylococcus aureus bacteremia Active Problems:   Essential hypertension   COPD (chronic obstructive pulmonary disease) (HCC)   Bacteremia Step viridans   Hypothyroidism  1. Bacteremia with Streptococcus viridans - one of the bottles were positive for Streptococcus viridans bacteremia ( blood culture obtained from 3/6 during last admission). Repeat blood cultures pending, esr wnl. 2-D echo pending. s/p vancomycin x1 on 3/9, now on ancef per infectious disease recommendations.  infectious disease input appreciated.  2. Recent admission for COPD exacerbation - appears to have resolved. Continue home inhalers. 3. Diastolic CHF with last EF measured in 2015 was 55-60% with grade 1 diastolic dysfunction - appears compensated. Continue Lasix 40 mg daily. 4. Hypothyroidism on Synthroid. H/o grave's disease s/p radioactive iodine treatment, now hypothyroidism on synthroid.check tsh 5. Hyperglycemia probably from recent steroid use. 6. Sever malnutrition, significantly underweight, nutrition consult. 7. Macrocytosis: will check b12/folate  Code Status: full  Family Communication: patient  And her niece who is going to be her main care giver, (patient's husband cecil Treaster just past away a few days ago)  Disposition Plan: pending blood culture   Consultants:  Infectious disease ( auto consult)  Procedures:  none  Antibiotics:  vanc x1 on 3/9  Ancef from 3/9   Objective: BP 105/55 mmHg  Pulse 70  Temp(Src) 98.7 F (37.1 C) (Oral)  Resp 18  Ht 5' (1.524 m)  Wt 40.8 kg (89 lb 15.2 oz)  BMI 17.57 kg/m2  SpO2 99%  Intake/Output Summary (Last 24 hours) at 12/29/15 1420 Last data filed at 12/29/15 1000  Gross per 24 hour  Intake   1280 ml  Output    1150 ml  Net    130 ml   Filed Weights   12/27/15 1730 12/27/15 2038 12/28/15 0120  Weight: 39.236 kg (86 lb 8 oz) 39.1 kg (86 lb 3.2 oz) 40.8 kg (89 lb 15.2 oz)    Exam:   General:  NAD, cachactic   Cardiovascular: RRR  Respiratory: CTABL  Abdomen: Soft/ND/NT, positive BS  Musculoskeletal: No Edema  Neuro: aaox3  Data Reviewed: Basic Metabolic Panel:  Recent Labs Lab 12/24/15 2330  12/26/15 0400 12/27/15 0341 12/27/15 1849 12/28/15 0501 12/29/15 0518  NA  --   < > 135 140 140 141 142  K  --   < > 4.6 4.4 4.9 4.7 4.7  CL  --   < > 83* 82* 77* 85* 85*  CO2  --   < > 42* 46* >50* 47* 48*  GLUCOSE  --   < > 167* 93 200* 84 103*  BUN  --   < > 13 12 12 9 10   CREATININE  --   < > 0.73 0.65 0.72 0.57 0.59  CALCIUM  --   < > 8.6* 8.9 9.9 9.0 9.1  MG 1.5*  --  2.2  --   --   --  1.8  PHOS 3.1  --   --   --   --   --   --   < > = values in this interval not displayed. Liver Function Tests:  Recent Labs Lab 12/25/15 0428 12/26/15 0400 12/27/15 0341 12/27/15 1849 12/28/15 0501  AST 26 19 18 26 21   ALT 25 20 19 25  18  ALKPHOS 62 58 55 75 54  BILITOT 0.4 0.3 0.5 0.4 0.5  PROT 6.2* 5.7* 6.1* 8.4* 6.1*  ALBUMIN 3.0* 2.9* 2.9* 3.9 2.8*   No results for input(s): LIPASE, AMYLASE in the last 168 hours. No results for input(s): AMMONIA in the last 168 hours. CBC:  Recent Labs Lab 12/25/15 0428 12/26/15 0400 12/27/15 0341 12/27/15 1850 12/28/15 0501 12/29/15 0518  WBC 3.7* 5.7 5.6 4.3 5.0 5.1  NEUTROABS 3.5 5.1 3.4 3.3 2.3  --   HGB 12.9 12.2 13.1 15.7* 12.6 12.3  HCT 42.4 39.9 42.3 52.4* 43.3 41.2  MCV 102.7* 101.0* 104.4* 105.2* 104.8* 103.0*  PLT 203 205 214 249 226 199   Cardiac Enzymes:   No results for input(s): CKTOTAL, CKMB, CKMBINDEX, TROPONINI in the last 168 hours. BNP (last 3 results)  Recent Labs  12/24/15 2000  BNP 26.7    ProBNP (last 3 results) No results for input(s): PROBNP in the last 8760 hours.  CBG: No results for  input(s): GLUCAP in the last 168 hours.  Recent Results (from the past 240 hour(s))  Blood Culture (routine x 2)     Status: None   Collection Time: 12/24/15  8:00 PM  Result Value Ref Range Status   Specimen Description BLOOD LEFT ANTECUBITAL  Final   Special Requests IN PEDIATRIC BOTTLE 3CC  Final   Culture  Setup Time   Final    GRAM POSITIVE COCCI IN CHAINS AEROBIC BOTTLE ONLY CRITICAL RESULT CALLED TO, READ BACK BY AND VERIFIED WITH: C Isola 12/25/15 @ 10 M VESTAL    Culture   Final    VIRIDANS STREPTOCOCCUS THE SIGNIFICANCE OF ISOLATING THIS ORGANISM FROM A SINGLE SET OF BLOOD CULTURES WHEN MULTIPLE SETS ARE DRAWN IS UNCERTAIN. PLEASE NOTIFY THE MICROBIOLOGY DEPARTMENT WITHIN ONE WEEK IF SPECIATION AND SENSITIVITIES ARE REQUIRED. STAPHYLOCOCCUS AUREUS    Report Status 12/28/2015 FINAL  Final   Organism ID, Bacteria STAPHYLOCOCCUS AUREUS  Final      Susceptibility   Staphylococcus aureus - MIC*    CIPROFLOXACIN <=0.5 SENSITIVE Sensitive     ERYTHROMYCIN >=8 RESISTANT Resistant     GENTAMICIN <=0.5 SENSITIVE Sensitive     OXACILLIN <=0.25 SENSITIVE Sensitive     TETRACYCLINE <=1 SENSITIVE Sensitive     VANCOMYCIN <=0.5 SENSITIVE Sensitive     TRIMETH/SULFA <=10 SENSITIVE Sensitive     CLINDAMYCIN <=0.25 SENSITIVE Sensitive     RIFAMPIN <=0.5 SENSITIVE Sensitive     Inducible Clindamycin NEGATIVE Sensitive     * STAPHYLOCOCCUS AUREUS  Blood Culture (routine x 2)     Status: None (Preliminary result)   Collection Time: 12/24/15  8:15 PM  Result Value Ref Range Status   Specimen Description BLOOD RIGHT ARM  Final   Special Requests IN PEDIATRIC BOTTLE 4CC  Final   Culture NO GROWTH 4 DAYS  Final   Report Status PENDING  Incomplete  Urine culture     Status: None   Collection Time: 12/24/15  9:09 PM  Result Value Ref Range Status   Specimen Description URINE, CLEAN CATCH  Final   Special Requests NONE  Final   Culture MULTIPLE SPECIES PRESENT, SUGGEST RECOLLECTION   Final   Report Status 12/26/2015 FINAL  Final  MRSA PCR Screening     Status: None   Collection Time: 12/25/15 12:41 AM  Result Value Ref Range Status   MRSA by PCR NEGATIVE NEGATIVE Final    Comment:        The GeneXpert MRSA Assay (  FDA approved for NASAL specimens only), is one component of a comprehensive MRSA colonization surveillance program. It is not intended to diagnose MRSA infection nor to guide or monitor treatment for MRSA infections.   Culture, blood (routine x 2)     Status: None (Preliminary result)   Collection Time: 12/27/15  6:37 PM  Result Value Ref Range Status   Specimen Description BLOOD LEFT ANTECUBITAL  Final   Special Requests   Final    BOTTLES DRAWN AEROBIC AND ANAEROBIC BLUE 10CC RED 5CC   Culture NO GROWTH < 24 HOURS  Final   Report Status PENDING  Incomplete  Culture, blood (routine x 2)     Status: None (Preliminary result)   Collection Time: 12/27/15  7:44 PM  Result Value Ref Range Status   Specimen Description BLOOD LEFT FOREARM  Final   Special Requests BOTTLES DRAWN AEROBIC AND ANAEROBIC 5CC  Final   Culture NO GROWTH < 24 HOURS  Final   Report Status PENDING  Incomplete  Urine culture     Status: None   Collection Time: 12/28/15 12:49 AM  Result Value Ref Range Status   Specimen Description URINE, RANDOM  Final   Special Requests NONE  Final   Culture 1,000 COLONIES/mL INSIGNIFICANT GROWTH  Final   Report Status 12/29/2015 FINAL  Final     Studies: No results found.  Scheduled Meds: . ALPRAZolam  0.25 mg Oral BID  . antiseptic oral rinse  7 mL Mouth Rinse BID  .  ceFAZolin (ANCEF) IV  1 g Intravenous 3 times per day  . citalopram  10 mg Oral Daily  . enoxaparin (LOVENOX) injection  20 mg Subcutaneous Q24H  . fluticasone furoate-vilanterol  1 puff Inhalation Daily  . furosemide  40 mg Oral Daily  . guaiFENesin  600 mg Oral BID  . levothyroxine  50 mcg Oral QAC breakfast  . mometasone-formoterol  2 puff Inhalation BID  .  potassium chloride  10 mEq Oral BID  . tiotropium  18 mcg Inhalation Daily    Continuous Infusions:    Time spent: 68mns  Luka Reisch MD, PhD  [facility] Pager [phone] If 7PM-7AM, please contact night-coverage at [email or web address], password [email or web address]3/08/2016, 2:20 PM  LOS: 2 days

## 2015-12-29 NOTE — Progress Notes (Addendum)
Initial Nutrition Assessment  DOCUMENTATION CODES:   Severe malnutrition in context of chronic illness, Underweight  INTERVENTION:    Ensure Enlive PO TID, each supplement provides 350 kcal and 20 grams of protein  NUTRITION DIAGNOSIS:   Malnutrition related to chronic illness as evidenced by severe depletion of body fat, severe depletion of muscle mass.  GOAL:   Patient will meet greater than or equal to 90% of their needs  MONITOR:   PO intake, Supplement acceptance, Labs, Weight trends, I & O's  REASON FOR ASSESSMENT:   Consult Assessment of nutrition requirement/status  ASSESSMENT:   70 y.o. female who was recently admitted and discharged on 3/8 after being treated for COPD exacerbation was called back to the ER as patient had positive blood cultures with streptococcus viridans in one of the bottles  Nutrition-Focused physical exam completed. Findings are severe fat depletion, severe muscle depletion, and no edema. Patient reports that she has always been small and would love to gain some weight. She has been eating very well, consuming 100% of meals since admission. Patient reports good appetite and good intake PTA and now. She says that she has an overactive thyroid, Graves disease, and this makes her burn extra calories. We discussed increasing calorie and protein intake with supplements. She likes Ensure, but doesn't always purchase it because it is so expensive. Discussed less expensive options for maximizing oral intake.  Diet Order:  Diet Heart Room service appropriate?: Yes; Fluid consistency:: Thin  Skin:  Reviewed, no issues  Last BM:  3/10  Height:   Ht Readings from Last 1 Encounters:  12/28/15 5' (1.524 m)    Weight:   Wt Readings from Last 1 Encounters:  12/28/15 89 lb 15.2 oz (40.8 kg)    Ideal Body Weight:  45.5 kg  BMI:  Body mass index is 17.57 kg/(m^2).  Estimated Nutritional Needs:   Kcal:  1250-1450  Protein:  65-80 gm  Fluid:   1.4 L  EDUCATION NEEDS:   Education needs addressed  Molli Barrows, [place], [place], [place] Pager [phone] After Hours Pager [phone]

## 2015-12-30 LAB — CBC
HCT: 41.5 % (ref 36.0–46.0)
Hemoglobin: 12.6 g/dL (ref 12.0–15.0)
MCH: 30.7 pg (ref 26.0–34.0)
MCHC: 30.4 g/dL (ref 30.0–36.0)
MCV: 101.2 fL — AB (ref 78.0–100.0)
PLATELETS: 213 10*3/uL (ref 150–400)
RBC: 4.1 MIL/uL (ref 3.87–5.11)
RDW: 14.5 % (ref 11.5–15.5)
WBC: 5.5 10*3/uL (ref 4.0–10.5)

## 2015-12-30 LAB — BASIC METABOLIC PANEL
Anion gap: 7 (ref 5–15)
BUN: 19 mg/dL (ref 6–20)
CALCIUM: 9.3 mg/dL (ref 8.9–10.3)
CO2: 47 mmol/L — AB (ref 22–32)
Chloride: 82 mmol/L — ABNORMAL LOW (ref 101–111)
Creatinine, Ser: 0.61 mg/dL (ref 0.44–1.00)
GFR calc Af Amer: >60 mL/min (ref >60)
GFR calc non Af Amer: >60 mL/min (ref >60)
Glucose, Bld: 190 mg/dL — ABNORMAL HIGH (ref 65–99)
POTASSIUM: 4.5 mmol/L (ref 3.5–5.1)
SODIUM: 136 mmol/L (ref 135–145)

## 2015-12-30 LAB — TSH: TSH: 6.076 u[IU]/mL — AB (ref 0.350–4.500)

## 2015-12-30 LAB — VITAMIN B12: Vitamin B-12: 372 pg/mL (ref 180–914)

## 2015-12-30 LAB — FOLATE: FOLATE: 9.8 ng/mL (ref >5.9)

## 2015-12-30 MED ORDER — LEVOTHYROXINE SODIUM 75 MCG PO TABS
75.0000 ug | ORAL_TABLET | Freq: Every day | ORAL | Status: DC
  Administered 2015-12-31 – 2016-01-01 (×2): 75 ug via ORAL
  Filled 2015-12-30 (×2): qty 1

## 2015-12-30 MED ORDER — SALINE SPRAY 0.65 % NA SOLN
1.0000 | NASAL | Status: DC | PRN
  Filled 2015-12-30: qty 44

## 2015-12-30 NOTE — Progress Notes (Signed)
PROGRESS NOTE  Jasmine [name] NFA:[id number] DOB: 1946-10-02 DOA: 12/27/2015 PCP: Jasmine Cower, MD  HPI/Recap of past 24 hours:   Denies pain, no fever, no sob  Assessment/Plan: Principal Problem:   Staphylococcus aureus bacteremia Active Problems:   Essential hypertension   COPD (chronic obstructive pulmonary disease) (HCC)   Bacteremia Step viridans   Hypothyroidism  1. Bacteremia with Streptococcus viridans - one of the bottles were positive for Streptococcus viridans bacteremia ( blood culture obtained from 3/6 during last admission). Repeat blood cultures pending, esr wnl. 2-D echo pending. s/p vancomycin x1 on 3/9, now on ancef per infectious disease recommendations.  infectious disease input appreciated.  2. Recent admission for COPD exacerbation - appears to have resolved. Continue home inhalers. 3. Diastolic CHF with last EF measured in 2015 was 55-60% with grade 1 diastolic dysfunction - appears compensated. Continue Lasix 40 mg daily. 4. Hypothyroidism on Synthroid. H/o grave's disease s/p radioactive iodine treatment, now hypothyroidism on synthroid. tsh elevated at 6, will increase synthroid, need to repeat tsh in 4-6 weeks after discharge. 5. Hyperglycemia probably from recent steroid use. a1c pending 6. Sever malnutrition, significantly underweight, nutrition consult appreciated, on nutrition supplement. 7. Macrocytosis:  B12/folate wnl , tsh elevated, synthroid dose increased.  Code Status: full  Family Communication: patient  And her niece who is going to be her main care giver, (patient's husband Jasmine [name] just past away a few days ago)  Disposition Plan: pending blood culture   Consultants:  Infectious disease ( auto consult)  Procedures:  none  Antibiotics:  vanc x1 on 3/9  Ancef from 3/9   Objective: BP 113/67 mmHg  Pulse 77  Temp(Src) 97.9 F (36.6 C) (Oral)  Resp 18  Ht 5' (1.524 m)  Wt 40.8 kg (89 lb 15.2 oz)  BMI 17.57 kg/m2  SpO2  98%  Intake/Output Summary (Last 24 hours) at 12/30/15 1519 Last data filed at 12/29/15 2233  Gross per 24 hour  Intake    240 ml  Output    600 ml  Net   -360 ml   Filed Weights   12/27/15 1730 12/27/15 2038 12/28/15 0120  Weight: 39.236 kg (86 lb 8 oz) 39.1 kg (86 lb 3.2 oz) 40.8 kg (89 lb 15.2 oz)    Exam:   General:  NAD, cachactic   Cardiovascular: RRR  Respiratory: CTABL  Abdomen: Soft/ND/NT, positive BS  Musculoskeletal: No Edema  Neuro: aaox3  Data Reviewed: Basic Metabolic Panel:  Recent Labs Lab 12/24/15 2330  12/26/15 0400 12/27/15 0341 12/27/15 1849 12/28/15 0501 12/29/15 0518 12/30/15 0424  NA  --   < > 135 140 140 141 142 136  K  --   < > 4.6 4.4 4.9 4.7 4.7 4.5  CL  --   < > 83* 82* 77* 85* 85* 82*  CO2  --   < > 42* 46* >50* 47* 48* 47*  GLUCOSE  --   < > 167* 93 200* 84 103* 190*  BUN  --   < > _0 CREATININE  --   < > 0.73 0.65 0.72 0.57 0.59 0.61  CALCIUM  --   < > 8.6* 8.9 9.9 9.0 9.1 9.3  MG 1.5*  --  2.2  --   --   --  1.8  --   PHOS 3.1  --   --   --   --   --   --   --   < > =  values in this interval not displayed. Liver Function Tests:  Recent Labs Lab 12/25/15 0428 12/26/15 0400 12/27/15 0341 12/27/15 1849 12/28/15 0501  AST _0 ALT _1 ALKPHOS 62 58 55 75 54  BILITOT 0.4 0.3 0.5 0.4 0.5  PROT 6.2* 5.7* 6.1* 8.4* 6.1*  ALBUMIN 3.0* 2.9* 2.9* 3.9 2.8*   No results for input(s): LIPASE, AMYLASE in the last 168 hours. No results for input(s): AMMONIA in the last 168 hours. CBC:  Recent Labs Lab 12/25/15 0428 12/26/15 0400 12/27/15 0341 12/27/15 1850 12/28/15 0501 12/29/15 0518 12/30/15 0424  WBC 3.7* 5.7 5.6 4.3 5.0 5.1 5.5  NEUTROABS 3.5 5.1 3.4 3.3 2.3  --   --   HGB 12.9 12.2 13.1 15.7* 12.6 12.3 12.6  HCT 42.4 39.9 42.3 52.4* 43.3 41.2 41.5  MCV 102.7* 101.0* 104.4* 105.2* 104.8* 103.0* 101.2*  PLT 203 205 214 249 226 199 213   Cardiac Enzymes:   No results for  input(s): CKTOTAL, CKMB, CKMBINDEX, TROPONINI in the last 168 hours. BNP (last 3 results)  Recent Labs  12/24/15 2000  BNP 26.7    ProBNP (last 3 results) No results for input(s): PROBNP in the last 8760 hours.  CBG: No results for input(s): GLUCAP in the last 168 hours.  Recent Results (from the past 240 hour(s))  Blood Culture (routine x 2)     Status: None   Collection Time: 12/24/15  8:00 PM  Result Value Ref Range Status   Specimen Description BLOOD LEFT ANTECUBITAL  Final   Special Requests IN PEDIATRIC BOTTLE 3CC  Final   Culture  Setup Time   Final    GRAM POSITIVE COCCI IN CHAINS AEROBIC BOTTLE ONLY CRITICAL RESULT CALLED TO, READ BACK BY AND VERIFIED WITH: C [employer] 12/25/15 @ 24 M VESTAL    Culture   Final    VIRIDANS STREPTOCOCCUS THE SIGNIFICANCE OF ISOLATING THIS ORGANISM FROM A SINGLE SET OF BLOOD CULTURES WHEN MULTIPLE SETS ARE DRAWN IS UNCERTAIN. PLEASE NOTIFY THE MICROBIOLOGY DEPARTMENT WITHIN ONE WEEK IF SPECIATION AND SENSITIVITIES ARE REQUIRED. STAPHYLOCOCCUS AUREUS    Report Status 12/28/2015 FINAL  Final   Organism ID, Bacteria STAPHYLOCOCCUS AUREUS  Final      Susceptibility   Staphylococcus aureus - MIC*    CIPROFLOXACIN <=0.5 SENSITIVE Sensitive     ERYTHROMYCIN >=8 RESISTANT Resistant     GENTAMICIN <=0.5 SENSITIVE Sensitive     OXACILLIN <=0.25 SENSITIVE Sensitive     TETRACYCLINE <=1 SENSITIVE Sensitive     VANCOMYCIN <=0.5 SENSITIVE Sensitive     TRIMETH/SULFA <=10 SENSITIVE Sensitive     CLINDAMYCIN <=0.25 SENSITIVE Sensitive     RIFAMPIN <=0.5 SENSITIVE Sensitive     Inducible Clindamycin NEGATIVE Sensitive     * STAPHYLOCOCCUS AUREUS  Blood Culture (routine x 2)     Status: None   Collection Time: 12/24/15  8:15 PM  Result Value Ref Range Status   Specimen Description BLOOD RIGHT ARM  Final   Special Requests IN PEDIATRIC BOTTLE 4CC  Final   Culture NO GROWTH 5 DAYS  Final   Report Status 12/29/2015 FINAL  Final  Urine culture      Status: None   Collection Time: 12/24/15  9:09 PM  Result Value Ref Range Status   Specimen Description URINE, CLEAN CATCH  Final   Special Requests NONE  Final   Culture MULTIPLE SPECIES PRESENT, SUGGEST RECOLLECTION  Final   Report Status 12/26/2015 FINAL  Final  MRSA PCR Screening     Status: None   Collection Time: 12/25/15 12:41 AM  Result Value Ref Range Status   MRSA by PCR NEGATIVE NEGATIVE Final    Comment:        The GeneXpert MRSA Assay (FDA approved for NASAL specimens only), is one component of a comprehensive MRSA colonization surveillance program. It is not intended to diagnose MRSA infection nor to guide or monitor treatment for MRSA infections.   Culture, blood (routine x 2)     Status: None (Preliminary result)   Collection Time: 12/27/15  6:37 PM  Result Value Ref Range Status   Specimen Description BLOOD LEFT ANTECUBITAL  Final   Special Requests   Final    BOTTLES DRAWN AEROBIC AND ANAEROBIC BLUE 10CC RED 5CC   Culture NO GROWTH 3 DAYS  Final   Report Status PENDING  Incomplete  Culture, blood (routine x 2)     Status: None (Preliminary result)   Collection Time: 12/27/15  7:44 PM  Result Value Ref Range Status   Specimen Description BLOOD LEFT FOREARM  Final   Special Requests BOTTLES DRAWN AEROBIC AND ANAEROBIC 5CC  Final   Culture NO GROWTH 3 DAYS  Final   Report Status PENDING  Incomplete  Urine culture     Status: None   Collection Time: 12/28/15 12:49 AM  Result Value Ref Range Status   Specimen Description URINE, RANDOM  Final   Special Requests NONE  Final   Culture 1,000 COLONIES/mL INSIGNIFICANT GROWTH  Final   Report Status 12/29/2015 FINAL  Final     Studies: No results found.  Scheduled Meds: . ALPRAZolam  0.25 mg Oral BID  . antiseptic oral rinse  7 mL Mouth Rinse BID  .  ceFAZolin (ANCEF) IV  1 g Intravenous 3 times per day  . citalopram  10 mg Oral Daily  . enoxaparin (LOVENOX) injection  20 mg Subcutaneous Q24H  . feeding  supplement (ENSURE ENLIVE)  237 mL Oral TID BM  . fluticasone furoate-vilanterol  1 puff Inhalation Daily  . furosemide  40 mg Oral Daily  . guaiFENesin  600 mg Oral BID  . levothyroxine  50 mcg Oral QAC breakfast  . mometasone-formoterol  2 puff Inhalation BID  . potassium chloride  10 mEq Oral BID  . tiotropium  18 mcg Inhalation Daily    Continuous Infusions:    Time spent: 74mns  Kwanza Cancelliere MD, PhD  [facility] Pager [phone] If 7PM-7AM, please contact night-coverage at [email or web address], password [email or web address]3/09/2016, 3:19 PM  LOS: 3 days

## 2015-12-31 DIAGNOSIS — J9611 Chronic respiratory failure with hypoxia: Secondary | ICD-10-CM

## 2015-12-31 DIAGNOSIS — Z9981 Dependence on supplemental oxygen: Secondary | ICD-10-CM

## 2015-12-31 DIAGNOSIS — J9621 Acute and chronic respiratory failure with hypoxia: Secondary | ICD-10-CM | POA: Insufficient documentation

## 2015-12-31 DIAGNOSIS — I5032 Chronic diastolic (congestive) heart failure: Secondary | ICD-10-CM | POA: Insufficient documentation

## 2015-12-31 LAB — BASIC METABOLIC PANEL
ANION GAP: 10 (ref 5–15)
ANION GAP: 18 — AB (ref 5–15)
BUN: 15 mg/dL (ref 6–20)
BUN: 19 mg/dL (ref 6–20)
CALCIUM: 9.6 mg/dL (ref 8.9–10.3)
CHLORIDE: 78 mmol/L — AB (ref 101–111)
CO2: 45 mmol/L — AB (ref 22–32)
CO2: 49 mmol/L — AB (ref 22–32)
CREATININE: 0.6 mg/dL (ref 0.44–1.00)
Calcium: 11.1 mg/dL — ABNORMAL HIGH (ref 8.9–10.3)
Chloride: 85 mmol/L — ABNORMAL LOW (ref 101–111)
Creatinine, Ser: 0.52 mg/dL (ref 0.44–1.00)
GFR calc Af Amer: >60 mL/min (ref >60)
GFR calc non Af Amer: >60 mL/min (ref >60)
GLUCOSE: 123 mg/dL — AB (ref 65–99)
GLUCOSE: 116 mg/dL — AB (ref 65–99)
Potassium: 5.6 mmol/L — ABNORMAL HIGH (ref 3.5–5.1)
Potassium: 4.7 mmol/L (ref 3.5–5.1)
Sodium: 140 mmol/L (ref 135–145)
Sodium: 145 mmol/L (ref 135–145)

## 2015-12-31 LAB — CBC
HEMATOCRIT: 41.9 % (ref 36.0–46.0)
Hemoglobin: 12.6 g/dL (ref 12.0–15.0)
MCH: 31.1 pg (ref 26.0–34.0)
MCHC: 30.1 g/dL (ref 30.0–36.0)
MCV: 103.5 fL — AB (ref 78.0–100.0)
PLATELETS: 210 10*3/uL (ref 150–400)
RBC: 4.05 MIL/uL (ref 3.87–5.11)
RDW: 14.7 % (ref 11.5–15.5)
WBC: 6.3 10*3/uL (ref 4.0–10.5)

## 2015-12-31 LAB — HEMOGLOBIN A1C
Hgb A1c MFr Bld: 6.3 % — ABNORMAL HIGH (ref 4.8–5.6)
Mean Plasma Glucose: 134 mg/dL

## 2015-12-31 LAB — PATHOLOGIST SMEAR REVIEW

## 2015-12-31 MED ORDER — SODIUM CHLORIDE 0.9% FLUSH
10.0000 mL | INTRAVENOUS | Status: DC | PRN
  Administered 2016-01-01: 10 mL
  Filled 2015-12-31: qty 40

## 2015-12-31 MED ORDER — ENSURE ENLIVE PO LIQD: 237.0000 mL | Freq: Three times a day (TID) | ORAL | Status: DC

## 2015-12-31 MED ORDER — LEVOTHYROXINE SODIUM 75 MCG PO TABS: 75.0000 ug | ORAL_TABLET | Freq: Every day | ORAL | Status: DC

## 2015-12-31 NOTE — Care Management Important Message (Signed)
Important Message  Patient Details  Name: Jasmine [name] MRN: [id number] Date of Birth: 09-12-1946   Medicare Important Message Given:  Yes    [name] 12/31/2015, 12:42 PM

## 2015-12-31 NOTE — Progress Notes (Signed)
Peripherally Inserted Central Catheter/Midline Placement  The IV Nurse has discussed with the patient and/or persons authorized to consent for the patient, the purpose of this procedure and the potential benefits and risks involved with this procedure.  The benefits include less needle sticks, lab draws from the catheter and patient may be discharged home with the catheter.  Risks include, but not limited to, infection, bleeding, blood clot (thrombus formation), and puncture of an artery; nerve damage and irregular heat beat.  Alternatives to this procedure were also discussed.  PICC/Midline Placement Documentation        Darlyn Read 12/31/2015, 5:16 PM

## 2015-12-31 NOTE — Progress Notes (Signed)
Patient ID: Jasmine [name], female   DOB: Mar 15, 1946, 70 y.o.   MRN: [id number]         [facility]    Date of Admission:  12/27/2015           Day 4 cefazolin  Principal Problem:   Staphylococcus aureus bacteremia Active Problems:   Bacteremia Step viridans   Essential hypertension   COPD (chronic obstructive pulmonary disease) (HCC)   Hypothyroidism   . ALPRAZolam  0.25 mg Oral BID  . antiseptic oral rinse  7 mL Mouth Rinse BID  .  ceFAZolin (ANCEF) IV  1 g Intravenous 3 times per day  . citalopram  10 mg Oral Daily  . enoxaparin (LOVENOX) injection  20 mg Subcutaneous Q24H  . feeding supplement (ENSURE ENLIVE)  237 mL Oral TID BM  . fluticasone furoate-vilanterol  1 puff Inhalation Daily  . furosemide  40 mg Oral Daily  . guaiFENesin  600 mg Oral BID  . levothyroxine  75 mcg Oral QAC breakfast  . mometasone-formoterol  2 puff Inhalation BID  . potassium chloride  10 mEq Oral BID  . tiotropium  18 mcg Inhalation Daily    SUBJECTIVE: She is feeling much better. She is worried about getting out of the hospital on time for her husband's funeral this Friday.  Review of Systems: Review of Systems  Constitutional: Positive for malaise/fatigue. Negative for fever, chills, weight loss and diaphoresis.  HENT: Negative for sore throat.   Respiratory: Negative for cough, sputum production and shortness of breath.   Cardiovascular: Negative for chest pain.  Gastrointestinal: Negative for nausea, vomiting, abdominal pain and diarrhea.  Genitourinary: Negative for dysuria.  Musculoskeletal: Negative for myalgias and joint pain.  Skin: Negative for rash.  Neurological: Negative for headaches.    Past Medical History  Diagnosis Date  . ALLERGIC RHINITIS 05/01/2007  . DEPRESSION 07/21/2008  . FATIGUE 10/10/2010  . HYPERTENSION 05/01/2007  . LUMBAR RADICULOPATHY, RIGHT 08/25/2008  . OSTEOPOROSIS 05/01/2007  . SHOULDER PAIN, LEFT 08/23/2009  . SINUSITIS,  CHRONIC 10/10/2010  . SINUSITIS- ACUTE-NOS 10/10/2008  . DVT (deep venous thrombosis) (HCC) 1960s    LLE  . CHF (congestive heart failure) ([place])   . HYPERTHYROIDISM 11/23/2007    Pt endorses having had Graves disease, possibly radioactive iodine x1, but no thyroidectomy  . HYPOTHYROIDISM 08/23/2009  . ASTHMA 05/01/2007    "since I was a child"  . Chronic bronchitis ([place])   . Arthritis     "hands" (12/28/2015)  . Anxiety   . On home oxygen therapy     "3-4L; qd; all the time" (12/28/2015)    Social History  Substance Use Topics  . Smoking status: Former Smoker -- 1.00 packs/day for 54 years    Types: Cigarettes    Quit date: 07/21/2015  . Smokeless tobacco: Never Used  . Alcohol Use: Yes     Comment: 12/28/2015 "might drink a beer/month"    Family History  Problem Relation Age of Onset  . Alcohol abuse Brother   . Diabetes Brother   . Hypertension Other   . Lung cancer Father   . Stroke Mother   . Asthma      maternal aunts  . Thyroid disease Mother    Allergies  Allergen Reactions  . Alendronate Sodium Other (See Comments)    Pt does not remember this reaction    OBJECTIVE: Filed Vitals:   12/30/15 1410 12/30/15 2135 12/31/15 0020 12/31/15 0515  BP: 113/67  111/66 113/60  Pulse: 77  82 72  Temp: 97.9 F (36.6 C)  98.6 F (37 C) 97.4 F (36.3 C)  TempSrc: Oral  Oral Axillary  Resp: 18  18 18   Height:      Weight:      SpO2: 98% 97% 100%    Body mass index is 17.57 kg/(m^2).  Physical Exam  Constitutional: She is oriented to person, place, and time.  She is alert and in no distress.  HENT:  Mouth/Throat: No oropharyngeal exudate.  Eyes: Conjunctivae are normal.  Cardiovascular: Normal rate and regular rhythm.   No murmur heard. Pulmonary/Chest: Breath sounds normal.  Abdominal: Soft. She exhibits no mass. There is no tenderness.  Musculoskeletal: Normal range of motion.  Neurological: She is alert and oriented to person, place, and time.  Skin: No rash  noted.  Psychiatric: Mood and affect normal.    Lab Results Lab Results  Component Value Date   WBC 6.3 12/31/2015   HGB 12.6 12/31/2015   HCT 41.9 12/31/2015   MCV 103.5* 12/31/2015   PLT 210 12/31/2015    Lab Results  Component Value Date   CREATININE 0.52 12/31/2015   BUN 15 12/31/2015   NA 140 12/31/2015   K 5.6* 12/31/2015   CL 85* 12/31/2015   CO2 45* 12/31/2015    Lab Results  Component Value Date   ALT 18 12/28/2015   AST 21 12/28/2015   ALKPHOS 54 12/28/2015   BILITOT 0.5 12/28/2015     Microbiology: Recent Results (from the past 240 hour(s))  Blood Culture (routine x 2)     Status: None   Collection Time: 12/24/15  8:00 PM  Result Value Ref Range Status   Specimen Description BLOOD LEFT ANTECUBITAL  Final   Special Requests IN PEDIATRIC BOTTLE 3CC  Final   Culture  Setup Time   Final    GRAM POSITIVE COCCI IN CHAINS AEROBIC BOTTLE ONLY CRITICAL RESULT CALLED TO, READ BACK BY AND VERIFIED WITH: C Cole 12/25/15 @ 36 M VESTAL    Culture   Final    VIRIDANS STREPTOCOCCUS THE SIGNIFICANCE OF ISOLATING THIS ORGANISM FROM A SINGLE SET OF BLOOD CULTURES WHEN MULTIPLE SETS ARE DRAWN IS UNCERTAIN. PLEASE NOTIFY THE MICROBIOLOGY DEPARTMENT WITHIN ONE WEEK IF SPECIATION AND SENSITIVITIES ARE REQUIRED. STAPHYLOCOCCUS AUREUS    Report Status 12/28/2015 FINAL  Final   Organism ID, Bacteria STAPHYLOCOCCUS AUREUS  Final      Susceptibility   Staphylococcus aureus - MIC*    CIPROFLOXACIN <=0.5 SENSITIVE Sensitive     ERYTHROMYCIN >=8 RESISTANT Resistant     GENTAMICIN <=0.5 SENSITIVE Sensitive     OXACILLIN <=0.25 SENSITIVE Sensitive     TETRACYCLINE <=1 SENSITIVE Sensitive     VANCOMYCIN <=0.5 SENSITIVE Sensitive     TRIMETH/SULFA <=10 SENSITIVE Sensitive     CLINDAMYCIN <=0.25 SENSITIVE Sensitive     RIFAMPIN <=0.5 SENSITIVE Sensitive     Inducible Clindamycin NEGATIVE Sensitive     * STAPHYLOCOCCUS AUREUS  Blood Culture (routine x 2)     Status: None    Collection Time: 12/24/15  8:15 PM  Result Value Ref Range Status   Specimen Description BLOOD RIGHT ARM  Final   Special Requests IN PEDIATRIC BOTTLE 4CC  Final   Culture NO GROWTH 5 DAYS  Final   Report Status 12/29/2015 FINAL  Final  Urine culture     Status: None   Collection Time: 12/24/15  9:09 PM  Result Value Ref Range Status  Specimen Description URINE, CLEAN CATCH  Final   Special Requests NONE  Final   Culture MULTIPLE SPECIES PRESENT, SUGGEST RECOLLECTION  Final   Report Status 12/26/2015 FINAL  Final  MRSA PCR Screening     Status: None   Collection Time: 12/25/15 12:41 AM  Result Value Ref Range Status   MRSA by PCR NEGATIVE NEGATIVE Final    Comment:        The GeneXpert MRSA Assay (FDA approved for NASAL specimens only), is one component of a comprehensive MRSA colonization surveillance program. It is not intended to diagnose MRSA infection nor to guide or monitor treatment for MRSA infections.   Culture, blood (routine x 2)     Status: None (Preliminary result)   Collection Time: 12/27/15  6:37 PM  Result Value Ref Range Status   Specimen Description BLOOD LEFT ANTECUBITAL  Final   Special Requests   Final    BOTTLES DRAWN AEROBIC AND ANAEROBIC BLUE 10CC RED 5CC   Culture NO GROWTH 4 DAYS  Final   Report Status PENDING  Incomplete  Culture, blood (routine x 2)     Status: None (Preliminary result)   Collection Time: 12/27/15  7:44 PM  Result Value Ref Range Status   Specimen Description BLOOD LEFT FOREARM  Final   Special Requests BOTTLES DRAWN AEROBIC AND ANAEROBIC 5CC  Final   Culture NO GROWTH 4 DAYS  Final   Report Status PENDING  Incomplete  Urine culture     Status: None   Collection Time: 12/28/15 12:49 AM  Result Value Ref Range Status   Specimen Description URINE, RANDOM  Final   Special Requests NONE  Final   Culture 1,000 COLONIES/mL INSIGNIFICANT GROWTH  Final   Report Status 12/29/2015 FINAL  Final     ASSESSMENT: She had transient  bacteremia with MSSA and viridans strep. She has no clinical or echocardiographic evidence of endocarditis by TTE. I do not think she needs a TEE but I do feel it is best that she receive a total of 2 weeks of IV antibiotic therapy.  PLAN: 1. Recommend PICC placement and 10 more days of IV cefazolin 2. I will sign off now  Michel Bickers, MD [facility] for [place] [phone] pager   [phone] cell 12/31/2015, 1:05 PM

## 2015-12-31 NOTE — Progress Notes (Signed)
PROGRESS NOTE  Jasmine [name]:[id number] DOB: [birth year] DOA: 12/27/2015 PCP: [name], MD  HPI/Recap of past 24 hours:   Denies pain, no fever, no sob  Assessment/Plan: Principal Problem:   Staphylococcus aureus bacteremia Active Problems:   Essential hypertension   COPD (chronic obstructive pulmonary disease) (HCC)   Bacteremia Step viridans   Hypothyroidism  1. Bacteremia with Streptococcus viridans - one of the bottles were positive for Streptococcus viridans bacteremia ( blood culture obtained from 3/6 during last admission). Repeat blood cultures pending, esr wnl. 2-D echo no obvious vegetation. s/p vancomycin x1 on 3/9, now on ancef per infectious disease recommendations. Per ID, picc placement for iv ancef tid for 10 days,  infectious disease input appreciated.  2. Recent admission for COPD exacerbation/chronic respiratory failure on home oxygen 3liter 24/7/chronic co2 retention,  - stable, no wheezing, denies sob, Continue home inhalers. 3. Diastolic CHF with last EF measured in 2015 was 55-60% with grade 1 diastolic dysfunction - appears compensated. Continue Lasix 40 mg daily. 4. Hypothyroidism on Synthroid. H/o grave's disease s/p radioactive iodine treatment, now hypothyroidism on synthroid. tsh elevated at 6, synthroid dose increased, need to repeat tsh in 4-6 weeks after discharge. 5. Hyperglycemia probably from recent steroid use. a1c 6.3. 6. Sever malnutrition, significantly underweight, nutrition consult appreciated, on nutrition supplement. 7. Macrocytosis:  B12/folate wnl , tsh elevated, synthroid dose increased. 8. k 5.6 on 3/13, likely lab error, repeat bmp, k wnl  Code Status: full  Family Communication: patient  And her niece who is going to be her main care giver, (patient's husband Jasmine [name] just past away a few days ago)  Disposition Plan: picc placement for iv ancef 20moe days, need home health, anticipate discharge on  3/14.   Consultants:  Infectious disease ( auto consult)  Procedures:  picc 3/13  Antibiotics:  vanc x1 on 3/9  Ancef from 3/9   Objective: BP 113/60 mmHg  Pulse 72  Temp(Src) 97.4 F (36.3 C) (Axillary)  Resp 18  Ht 5' (1.524 m)  Wt 40.8 kg (89 lb 15.2 oz)  BMI 17.57 kg/m2  SpO2 100%  Intake/Output Summary (Last 24 hours) at 12/31/15 1453 Last data filed at 12/31/15 1400  Gross per 24 hour  Intake 3368.33 ml  Output   2550 ml  Net 818.33 ml   Filed Weights   12/27/15 1730 12/27/15 2038 12/28/15 0120  Weight: 39.236 kg (86 lb 8 oz) 39.1 kg (86 lb 3.2 oz) 40.8 kg (89 lb 15.2 oz)    Exam:   General:  NAD, cachactic   Cardiovascular: RRR  Respiratory: CTABL  Abdomen: Soft/ND/NT, positive BS  Musculoskeletal: No Edema  Neuro: aaox3  Data Reviewed: Basic Metabolic Panel:  Recent Labs Lab 12/24/15 2330  12/26/15 0400  12/27/15 1849 12/28/15 0501 12/29/15 0518 12/30/15 0424 12/31/15 0639  NA  --   < > 135  < > 140 141 142 136 140  K  --   < > 4.6  < > 4.9 4.7 4.7 4.5 5.6*  CL  --   < > 83*  < > 77* 85* 85* 82* 85*  CO2  --   < > 42*  < > >50* 47* 48* 47* 45*  GLUCOSE  --   < > 167*  < > 200* 84 103* 190* 123*  BUN  --   < > 13  < > 12 9 10 19 15   CREATININE  --   < > 0.73  < > 0.72  0.57 0.59 0.61 0.52  CALCIUM  --   < > 8.6*  < > 9.9 9.0 9.1 9.3 9.6  MG 1.5*  --  2.2  --   --   --  1.8  --   --   PHOS 3.1  --   --   --   --   --   --   --   --   < > = values in this interval not displayed. Liver Function Tests:  Recent Labs Lab 12/25/15 0428 12/26/15 0400 12/27/15 0341 12/27/15 1849 12/28/15 0501  AST 26 19 18 26 21   ALT 25 20 19 25 18   ALKPHOS 62 58 55 75 54  BILITOT 0.4 0.3 0.5 0.4 0.5  PROT 6.2* 5.7* 6.1* 8.4* 6.1*  ALBUMIN 3.0* 2.9* 2.9* 3.9 2.8*   No results for input(s): LIPASE, AMYLASE in the last 168 hours. No results for input(s): AMMONIA in the last 168 hours. CBC:  Recent Labs Lab 12/25/15 0428 12/26/15 0400  12/27/15 0341 12/27/15 1850 12/28/15 0501 12/29/15 0518 12/30/15 0424 12/31/15 0639  WBC 3.7* 5.7 5.6 4.3 5.0 5.1 5.5 6.3  NEUTROABS 3.5 5.1 3.4 3.3 2.3  --   --   --   HGB 12.9 12.2 13.1 15.7* 12.6 12.3 12.6 12.6  HCT 42.4 39.9 42.3 52.4* 43.3 41.2 41.5 41.9  MCV 102.7* 101.0* 104.4* 105.2* 104.8* 103.0* 101.2* 103.5*  PLT 203 205 214 249 226 199 213 210   Cardiac Enzymes:   No results for input(s): CKTOTAL, CKMB, CKMBINDEX, TROPONINI in the last 168 hours. BNP (last 3 results)  Recent Labs  12/24/15 2000  BNP 26.7    ProBNP (last 3 results) No results for input(s): PROBNP in the last 8760 hours.  CBG: No results for input(s): GLUCAP in the last 168 hours.  Recent Results (from the past 240 hour(s))  Blood Culture (routine x 2)     Status: None   Collection Time: 12/24/15  8:00 PM  Result Value Ref Range Status   Specimen Description BLOOD LEFT ANTECUBITAL  Final   Special Requests IN PEDIATRIC BOTTLE 3CC  Final   Culture  Setup Time   Final    GRAM POSITIVE COCCI IN CHAINS AEROBIC BOTTLE ONLY CRITICAL RESULT CALLED TO, READ BACK BY AND VERIFIED WITH: C Carpenter 12/25/15 @ 49 M VESTAL    Culture   Final    VIRIDANS STREPTOCOCCUS THE SIGNIFICANCE OF ISOLATING THIS ORGANISM FROM A SINGLE SET OF BLOOD CULTURES WHEN MULTIPLE SETS ARE DRAWN IS UNCERTAIN. PLEASE NOTIFY THE MICROBIOLOGY DEPARTMENT WITHIN ONE WEEK IF SPECIATION AND SENSITIVITIES ARE REQUIRED. STAPHYLOCOCCUS AUREUS    Report Status 12/28/2015 FINAL  Final   Organism ID, Bacteria STAPHYLOCOCCUS AUREUS  Final      Susceptibility   Staphylococcus aureus - MIC*    CIPROFLOXACIN <=0.5 SENSITIVE Sensitive     ERYTHROMYCIN >=8 RESISTANT Resistant     GENTAMICIN <=0.5 SENSITIVE Sensitive     OXACILLIN <=0.25 SENSITIVE Sensitive     TETRACYCLINE <=1 SENSITIVE Sensitive     VANCOMYCIN <=0.5 SENSITIVE Sensitive     TRIMETH/SULFA <=10 SENSITIVE Sensitive     CLINDAMYCIN <=0.25 SENSITIVE Sensitive     RIFAMPIN  <=0.5 SENSITIVE Sensitive     Inducible Clindamycin NEGATIVE Sensitive     * STAPHYLOCOCCUS AUREUS  Blood Culture (routine x 2)     Status: None   Collection Time: 12/24/15  8:15 PM  Result Value Ref Range Status   Specimen Description BLOOD RIGHT ARM  Final   Special Requests IN PEDIATRIC BOTTLE 4CC  Final   Culture NO GROWTH 5 DAYS  Final   Report Status 12/29/2015 FINAL  Final  Urine culture     Status: None   Collection Time: 12/24/15  9:09 PM  Result Value Ref Range Status   Specimen Description URINE, CLEAN CATCH  Final   Special Requests NONE  Final   Culture MULTIPLE SPECIES PRESENT, SUGGEST RECOLLECTION  Final   Report Status 12/26/2015 FINAL  Final  MRSA PCR Screening     Status: None   Collection Time: 12/25/15 12:41 AM  Result Value Ref Range Status   MRSA by PCR NEGATIVE NEGATIVE Final    Comment:        The GeneXpert MRSA Assay (FDA approved for NASAL specimens only), is one component of a comprehensive MRSA colonization surveillance program. It is not intended to diagnose MRSA infection nor to guide or monitor treatment for MRSA infections.   Culture, blood (routine x 2)     Status: None (Preliminary result)   Collection Time: 12/27/15  6:37 PM  Result Value Ref Range Status   Specimen Description BLOOD LEFT ANTECUBITAL  Final   Special Requests   Final    BOTTLES DRAWN AEROBIC AND ANAEROBIC BLUE 10CC RED 5CC   Culture NO GROWTH 4 DAYS  Final   Report Status PENDING  Incomplete  Culture, blood (routine x 2)     Status: None (Preliminary result)   Collection Time: 12/27/15  7:44 PM  Result Value Ref Range Status   Specimen Description BLOOD LEFT FOREARM  Final   Special Requests BOTTLES DRAWN AEROBIC AND ANAEROBIC 5CC  Final   Culture NO GROWTH 4 DAYS  Final   Report Status PENDING  Incomplete  Urine culture     Status: None   Collection Time: 12/28/15 12:49 AM  Result Value Ref Range Status   Specimen Description URINE, RANDOM  Final   Special  Requests NONE  Final   Culture 1,000 COLONIES/mL INSIGNIFICANT GROWTH  Final   Report Status 12/29/2015 FINAL  Final     Studies: No results found.  Scheduled Meds: . ALPRAZolam  0.25 mg Oral BID  . antiseptic oral rinse  7 mL Mouth Rinse BID  .  ceFAZolin (ANCEF) IV  1 g Intravenous 3 times per day  . citalopram  10 mg Oral Daily  . enoxaparin (LOVENOX) injection  20 mg Subcutaneous Q24H  . feeding supplement (ENSURE ENLIVE)  237 mL Oral TID BM  . fluticasone furoate-vilanterol  1 puff Inhalation Daily  . furosemide  40 mg Oral Daily  . guaiFENesin  600 mg Oral BID  . levothyroxine  75 mcg Oral QAC breakfast  . mometasone-formoterol  2 puff Inhalation BID  . tiotropium  18 mcg Inhalation Daily    Continuous Infusions:    Time spent: 78mns  Vaudine Dutan MD, PhD  [facility] Pager [phone] If 7PM-7AM, please contact night-coverage at [email or web address], password [email or web address] At Redbird Square3/13/2017, 2:53 PM  LOS: 4 days

## 2015-12-31 NOTE — Care Management Note (Addendum)
Case Management Note  Patient Details  Name: Jasmine [name] MRN: [id number] Date of Birth: [birth year]  Subjective/Objective:                    Action/Plan: Patient is discharging to her niece's home [place] , she will call niece to get street number and apartment number. Patient aware she will be shown how to adm IV ABX   Niece is [place]   Will need HHRN order and prescription   Expected Discharge Date:                  Expected Discharge Plan:  [place]  In-House Referral:     Discharge planning Services  CM Consult  Post Acute Care Choice:    Choice offered to:  Patient  DME Arranged:    DME Agency:     HH Arranged:  RN, IV Antibiotics HH Agency:  [place]  Status of Service:  Completed, signed off  Medicare Important Message Given:  Yes Date Medicare IM Given:    Medicare IM give by:    Date Additional Medicare IM Given:    Additional Medicare Important Message give by:     If discussed at [place] of Stay Meetings, dates discussed:    Additional Comments:  Marilu Favre, RN 12/31/2015, 3:22 PM

## 2016-01-01 ENCOUNTER — Other Ambulatory Visit: Payer: Self-pay | Admitting: *Deleted

## 2016-01-01 ENCOUNTER — Encounter: Payer: Self-pay | Admitting: *Deleted

## 2016-01-01 DIAGNOSIS — B954 Other streptococcus as the cause of diseases classified elsewhere: Secondary | ICD-10-CM | POA: Diagnosis not present

## 2016-01-01 DIAGNOSIS — R7881 Bacteremia: Secondary | ICD-10-CM | POA: Diagnosis not present

## 2016-01-01 DIAGNOSIS — M5416 Radiculopathy, lumbar region: Secondary | ICD-10-CM | POA: Diagnosis not present

## 2016-01-01 DIAGNOSIS — J45909 Unspecified asthma, uncomplicated: Secondary | ICD-10-CM | POA: Diagnosis not present

## 2016-01-01 DIAGNOSIS — E039 Hypothyroidism, unspecified: Secondary | ICD-10-CM | POA: Diagnosis not present

## 2016-01-01 DIAGNOSIS — I5032 Chronic diastolic (congestive) heart failure: Secondary | ICD-10-CM | POA: Diagnosis not present

## 2016-01-01 DIAGNOSIS — A4101 Sepsis due to Methicillin susceptible Staphylococcus aureus: Secondary | ICD-10-CM

## 2016-01-01 DIAGNOSIS — I10 Essential (primary) hypertension: Secondary | ICD-10-CM | POA: Diagnosis not present

## 2016-01-01 DIAGNOSIS — B9561 Methicillin susceptible Staphylococcus aureus infection as the cause of diseases classified elsewhere: Secondary | ICD-10-CM | POA: Diagnosis not present

## 2016-01-01 DIAGNOSIS — J449 Chronic obstructive pulmonary disease, unspecified: Secondary | ICD-10-CM | POA: Diagnosis not present

## 2016-01-01 DIAGNOSIS — J9690 Respiratory failure, unspecified, unspecified whether with hypoxia or hypercapnia: Secondary | ICD-10-CM | POA: Diagnosis not present

## 2016-01-01 LAB — BASIC METABOLIC PANEL
Anion gap: 5 (ref 5–15)
BUN: 21 mg/dL — AB (ref 6–20)
CHLORIDE: 85 mmol/L — AB (ref 101–111)
CO2: 48 mmol/L — AB (ref 22–32)
CREATININE: 0.5 mg/dL (ref 0.44–1.00)
Calcium: 9.4 mg/dL (ref 8.9–10.3)
GFR calc non Af Amer: >60 mL/min (ref >60)
GLUCOSE: 111 mg/dL — AB (ref 65–99)
Potassium: 4.9 mmol/L (ref 3.5–5.1)
Sodium: 138 mmol/L (ref 135–145)

## 2016-01-01 LAB — CULTURE, BLOOD (ROUTINE X 2)
CULTURE: NO GROWTH
Culture: NO GROWTH

## 2016-01-01 LAB — MAGNESIUM: Magnesium: 1.9 mg/dL (ref 1.7–2.4)

## 2016-01-01 MED ORDER — CEFAZOLIN SODIUM 1-5 GM-% IV SOLN: 1.0000 g | Freq: Three times a day (TID) | INTRAVENOUS | Status: DC

## 2016-01-01 NOTE — Discharge Summary (Signed)
Discharge Summary  Jasmine [name] [id number] DOB: Mar 09, 1946  PCP: [name], MD  Admit date: 12/27/2015 Discharge date: 01/01/2016  Time spent: >39mns  Recommendations for Outpatient Follow-up:  1. F/u with PMD within 10 days for hospital discharge follow up, repeat cbc/bmp at follow up, pmd to monitor home abx therapy and to determine picc line removal when finish abx therapy. pmd to repeat tsh in 4-6 weeks. 2. F/u with [facility] for COPD , appointment on 3/24 3. Home health arranged with RN for home iv abx therapy  Discharge Diagnoses:  Active Hospital Problems   Diagnosis Date Noted  . Staphylococcus aureus bacteremia 12/28/2015  . Chronic respiratory failure with hypoxia ([facility]   . On home oxygen therapy   . [place] diastolic congestive heart failure ([place]   . Bacteremia Step viridans 12/27/2015  . Hypothyroidism 12/27/2015  . COPD (chronic obstructive pulmonary disease) ([facility] 11/29/2011  . Essential hypertension 05/01/2007    Resolved Hospital Problems   Diagnosis Date Noted Date Resolved  No resolved problems to display.    Discharge Condition: stable  Diet recommendation: heart healthy  Filed Weights   12/27/15 1730 12/27/15 2038 12/28/15 0120  Weight: 39.236 kg (86 lb 8 oz) 39.1 kg (86 lb 3.2 oz) 40.8 kg (89 lb 15.2 oz)    History of present illness:  Jasmine LINDOis a 70y.o. female who was recently admitted and discharged yesterday after being treated for COPD exacerbation was called back to the ER as patient had positive blood cultures with streptococcus viridans in one of the bottles. Patient presently states she feels much better than when she was admitted last time with shortness of breath largely improved. Denies any fever or chills night sweats headache visual symptoms or any joint pains. Repeat blood cultures have been obtained along with sedimentation rate and patient will be admitted for further management.    Hospital Course:    Principal Problem:   Staphylococcus aureus bacteremia Active Problems:   Essential hypertension   COPD (chronic obstructive pulmonary disease) (HCC)   Bacteremia Step viridans   Hypothyroidism   Chronic respiratory failure with hypoxia (HCC)   On home oxygen therapy   Chronic diastolic congestive heart failure (HCC)   Bacteremia with Streptococcus viridans and MSSA- one of the bottles were positive for Streptococcus viridans bacteremia and MSSA ( blood culture obtained from 3/6 during last admission). Repeat blood cultures no growth, esr wnl. 2-D echo no obvious vegetation. s/p vancomycin x1 on 3/9, now on ancef per infectious disease recommendations. Per ID, picc placement for iv ancef tid for 10 days, infectious disease input appreciated. home health arranged, patient is to follow up with pmd and repeat cbc/bmp on 3/20 , pmd to monitor home iv abx therapy and discontinue picc line when finish therapy.  Recent admission for COPD exacerbation/chronic respiratory failure on home oxygen 3liter 24/7/chronic co2 retention, - stable, no wheezing, denies sob, Continue home inhalers. And outpatient pulmonary follow up on 3/24.  Diastolic CHF with last EF measured in 2015 was 55-60% with grade 1 diastolic dysfunction - appears compensated. Reported has been on lasix 431mqd for the last year, will continue , no edema at discharge.  Hypothyroidism on Synthroid. H/o grave's disease s/p radioactive iodine treatment, now hypothyroidism on synthroid. tsh elevated at 6, synthroid dose increased, need to repeat tsh in 4-6 weeks after discharge.  Hyperglycemia probably from recent steroid use. a1c 6.3.  Sever malnutrition, significantly underweight, nutrition consult appreciated, on nutrition supplement.  Macrocytosis:  B12/folate wnl , tsh elevated, synthroid dose increased.  k 5.6 on 3/13, likely lab error, repeat bmp, k wnl  Code Status: full  Family Communication: patient And her niece  stephanie corpening at [phone] who is her HPOA and going to be her main care giver, (patient's husband cecil Pischke just past away a few days ago)  Disposition Plan: picc placement for iv ancef 104moe days, discharge on 3/14.   Consultants:  Infectious disease   Procedures:  picc 3/13  Antibiotics:  vanc x1 on 3/9  Ancef from 3/9  Discharge Exam: BP 109/60 mmHg  Pulse 76  Temp(Src) 97.5 F (36.4 C) (Oral)  Resp 18  Ht 5' (1.524 m)  Wt 40.8 kg (89 lb 15.2 oz)  BMI 17.57 kg/m2  SpO2 94%   General: NAD, cachactic   Cardiovascular: RRR  Respiratory: CTABL  Abdomen: Soft/ND/NT, positive BS  Musculoskeletal: No Edema  Neuro: aaox3   Discharge Instructions You were cared for by a hospitalist during your hospital stay. If you have any questions about your discharge medications or the care you received while you were in the hospital after you are discharged, you can call the unit and asked to speak with the hospitalist on call if the hospitalist that took care of you is not available. Once you are discharged, your primary care physician will handle any further medical issues. Please note that NO REFILLS for any discharge medications will be authorized once you are discharged, as it is imperative that you return to your primary care physician (or establish a relationship with a primary care physician if you do not have one) for your aftercare needs so that they can reassess your need for medications and monitor your lab values.      Discharge Instructions    Diet - low sodium heart healthy    Complete by:  As directed      Face-to-face encounter (required for Medicare/Medicaid patients)    Complete by:  As directed   I Sheritta Deeg certify that this patient is under my care and that I, or a nurse practitioner or physician's assistant working with me, had a face-to-face encounter that meets the physician face-to-face encounter requirements with this patient on 01/01/2016.  The encounter with the patient was in whole, or in part for the following medical condition(s) which is the primary reason for home health care (List medical condition): need home IV abx therapy for bacteremia. Home health RN to obtained orders from PMD for picc line removal when finish abx therapy.  The encounter with the patient was in whole, or in part, for the following medical condition, which is the primary reason for home health care:  bacteremia need home iv abx therapy  I certify that, based on my findings, the following services are medically necessary home health services:  Nursing  Reason for Medically Necessary Home Health Services:  Skilled Nursing- Change/Decline in Patient Status  My clinical findings support the need for the above services:   Shortness of breath with activity OTHER SEE COMMENTS    Further, I certify that my clinical findings support that this patient is homebound due to:  Shortness of Breath with activity     Home Health    Complete by:  As directed   To provide the following care/treatments:  RN     Increase activity slowly    Complete by:  As directed             Medication List  STOP taking these medications        doxycycline 100 MG tablet  Commonly known as:  VIBRA-TABS     mometasone-formoterol 200-5 MCG/ACT Aero  Commonly known as:  DULERA     potassium chloride 10 MEQ tablet  Commonly known as:  K-DUR      TAKE these medications        albuterol (2.5 MG/3ML) 0.083% nebulizer solution  Commonly known as:  PROVENTIL  Take 3 mLs (2.5 mg total) by nebulization every 4 (four) hours as needed for wheezing.     ALPRAZolam 0.25 MG tablet  Commonly known as:  XANAX  TAKE 1 TABLET BY MOUTH TWICE DAILY     ceFAZolin 1-5 GM-%  Commonly known as:  ANCEF  Inject 50 mLs (1 g total) into the vein every 8 (eight) hours. Please provide total of 10day supply.     citalopram 10 MG tablet  Commonly known as:  CELEXA  Take 1 tablet (10 mg total)  by mouth daily.     feeding supplement (ENSURE ENLIVE) Liqd  Take 237 mLs by mouth 3 (three) times daily between meals.     fluticasone furoate-vilanterol 200-25 MCG/INH Aepb  Commonly known as:  BREO ELLIPTA  Inhale 1 puff into the lungs daily.     furosemide 40 MG tablet  Commonly known as:  LASIX  Take 1 tablet (40 mg total) by mouth daily.     guaiFENesin 600 MG 12 hr tablet  Commonly known as:  MUCINEX  Take 1 tablet (600 mg total) by mouth 2 (two) times daily.     levothyroxine 75 MCG tablet  Commonly known as:  SYNTHROID, LEVOTHROID  Take 1 tablet (75 mcg total) by mouth daily before breakfast.     OXYGEN  Inhale 4 L into the lungs continuous.     tiotropium 18 MCG inhalation capsule  Commonly known as:  SPIRIVA HANDIHALER  Place 1 capsule (18 mcg total) into inhaler and inhale daily.       Allergies  Allergen Reactions  . Alendronate Sodium Other (See Comments)    Pt does not remember this reaction   Follow-up Information    Follow up with [name], MD On 01/07/2016.   Specialties:  Internal Medicine, Radiology   Why:  hospital discharge follow up, repeat cbc/bmp at follow up, monitor antibiotic therapy for blood infection.  pmd to repeat tsh in 4-6 weeks.   Contact information:   [place] [address] [phone]        The results of significant diagnostics from this hospitalization (including imaging, microbiology, ancillary and laboratory) are listed below for reference.    Significant Diagnostic Studies: Dg Chest 2 View  12/27/2015  CLINICAL DATA:  Abnormal labs. EXAM: CHEST  2 VIEW COMPARISON:  12/24/2015 FINDINGS: Lungs are hyperinflated. There prominence of interstitial markings throughout the lungs, unchanged since prior studies. Perihilar peribronchial thickening is noted. There are no focal consolidations. More confluent opacity is identified in the left upper lobe. Is difficult to exclude a pulmonary nodule. No evidence for  pulmonary edema. IMPRESSION: Chronic fibrotic changes. Possible left upper lobe pulmonary nodule. Consider further evaluation with CT of the chest. Intravenous contrast is recommended unless it is contraindicated. Electronically Signed   By: Nolon Nations M.D.   On: 12/27/2015 18:20   Dg Chest Port 1 View  12/24/2015  CLINICAL DATA:  70 year old female with shortness of breath today. History of CHF. EXAM: PORTABLE CHEST 1 VIEW  COMPARISON:  No priors. FINDINGS: Mild diffuse peribronchial cuffing and patchy areas of interstitial prominence, similar to prior examinations. Mild emphysema. No acute consolidative airspace disease. No pleural effusions. No definite suspicious appearing pulmonary nodules or masses. Heart size is normal. The patient is rotated to the left on today's exam, resulting in distortion of the mediastinal contours and reduced diagnostic sensitivity and specificity for mediastinal pathology. Atherosclerotic calcifications in the thoracic aorta. IMPRESSION: 1. No definite radiographic evidence of acute cardiopulmonary disease. 2. Peribronchial cuffing and emphysematous changes, similar to prior examination, suggestive of underlying COPD. 3. Atherosclerosis. Electronically Signed   By: Vinnie Langton M.D.   On: 12/24/2015 20:24    Microbiology: Recent Results (from the past 240 hour(s))  Blood Culture (routine x 2)     Status: None   Collection Time: 12/24/15  8:00 PM  Result Value Ref Range Status   Specimen Description BLOOD LEFT ANTECUBITAL  Final   Special Requests IN PEDIATRIC BOTTLE 3CC  Final   Culture  Setup Time   Final    GRAM POSITIVE COCCI IN CHAINS AEROBIC BOTTLE ONLY CRITICAL RESULT CALLED TO, READ BACK BY AND VERIFIED WITH: C Knierim 12/25/15 @ 15 M VESTAL    Culture   Final    VIRIDANS STREPTOCOCCUS THE SIGNIFICANCE OF ISOLATING THIS ORGANISM FROM A SINGLE SET OF BLOOD CULTURES WHEN MULTIPLE SETS ARE DRAWN IS UNCERTAIN. PLEASE NOTIFY THE MICROBIOLOGY DEPARTMENT  WITHIN ONE WEEK IF SPECIATION AND SENSITIVITIES ARE REQUIRED. STAPHYLOCOCCUS AUREUS    Report Status 12/28/2015 FINAL  Final   Organism ID, Bacteria STAPHYLOCOCCUS AUREUS  Final      Susceptibility   Staphylococcus aureus - MIC*    CIPROFLOXACIN <=0.5 SENSITIVE Sensitive     ERYTHROMYCIN >=8 RESISTANT Resistant     GENTAMICIN <=0.5 SENSITIVE Sensitive     OXACILLIN <=0.25 SENSITIVE Sensitive     TETRACYCLINE <=1 SENSITIVE Sensitive     VANCOMYCIN <=0.5 SENSITIVE Sensitive     TRIMETH/SULFA <=10 SENSITIVE Sensitive     CLINDAMYCIN <=0.25 SENSITIVE Sensitive     RIFAMPIN <=0.5 SENSITIVE Sensitive     Inducible Clindamycin NEGATIVE Sensitive     * STAPHYLOCOCCUS AUREUS  Blood Culture (routine x 2)     Status: None   Collection Time: 12/24/15  8:15 PM  Result Value Ref Range Status   Specimen Description BLOOD RIGHT ARM  Final   Special Requests IN PEDIATRIC BOTTLE 4CC  Final   Culture NO GROWTH 5 DAYS  Final   Report Status 12/29/2015 FINAL  Final  Urine culture     Status: None   Collection Time: 12/24/15  9:09 PM  Result Value Ref Range Status   Specimen Description URINE, CLEAN CATCH  Final   Special Requests NONE  Final   Culture MULTIPLE SPECIES PRESENT, SUGGEST RECOLLECTION  Final   Report Status 12/26/2015 FINAL  Final  MRSA PCR Screening     Status: None   Collection Time: 12/25/15 12:41 AM  Result Value Ref Range Status   MRSA by PCR NEGATIVE NEGATIVE Final    Comment:        The GeneXpert MRSA Assay (FDA approved for NASAL specimens only), is one component of a comprehensive MRSA colonization surveillance program. It is not intended to diagnose MRSA infection nor to guide or monitor treatment for MRSA infections.   Culture, blood (routine x 2)     Status: None (Preliminary result)   Collection Time: 12/27/15  6:37 PM  Result Value Ref Range Status  Specimen Description BLOOD LEFT ANTECUBITAL  Final   Special Requests   Final    BOTTLES DRAWN AEROBIC AND  ANAEROBIC BLUE 10CC RED 5CC   Culture NO GROWTH 4 DAYS  Final   Report Status PENDING  Incomplete  Culture, blood (routine x 2)     Status: None (Preliminary result)   Collection Time: 12/27/15  7:44 PM  Result Value Ref Range Status   Specimen Description BLOOD LEFT FOREARM  Final   Special Requests BOTTLES DRAWN AEROBIC AND ANAEROBIC 5CC  Final   Culture NO GROWTH 4 DAYS  Final   Report Status PENDING  Incomplete  Urine culture     Status: None   Collection Time: 12/28/15 12:49 AM  Result Value Ref Range Status   Specimen Description URINE, RANDOM  Final   Special Requests NONE  Final   Culture 1,000 COLONIES/mL INSIGNIFICANT GROWTH  Final   Report Status 12/29/2015 FINAL  Final     Labs: Basic Metabolic Panel:  Recent Labs Lab 12/26/15 0400  12/29/15 0518 12/30/15 0424 12/31/15 0639 12/31/15 1523 01/01/16 0430  NA 135  < > 142 136 140 145 138  K 4.6  < > 4.7 4.5 5.6* 4.7 4.9  CL 83*  < > 85* 82* 85* 78* 85*  CO2 42*  < > 48* 47* 45* 49* 48*  GLUCOSE 167*  < > 103* 190* 123* 116* 111*  BUN 13  < > 10 19 15 19  21*  CREATININE 0.73  < > 0.59 0.61 0.52 0.60 0.50  CALCIUM 8.6*  < > 9.1 9.3 9.6 11.1* 9.4  MG 2.2  --  1.8  --   --   --  1.9  < > = values in this interval not displayed. Liver Function Tests:  Recent Labs Lab 12/26/15 0400 12/27/15 0341 12/27/15 1849 12/28/15 0501  AST 19 18 26 21   ALT 20 19 25 18   ALKPHOS 58 55 75 54  BILITOT 0.3 0.5 0.4 0.5  PROT 5.7* 6.1* 8.4* 6.1*  ALBUMIN 2.9* 2.9* 3.9 2.8*   No results for input(s): LIPASE, AMYLASE in the last 168 hours. No results for input(s): AMMONIA in the last 168 hours. CBC:  Recent Labs Lab 12/26/15 0400 12/27/15 0341 12/27/15 1850 12/28/15 0501 12/29/15 0518 12/30/15 0424 12/31/15 0639  WBC 5.7 5.6 4.3 5.0 5.1 5.5 6.3  NEUTROABS 5.1 3.4 3.3 2.3  --   --   --   HGB 12.2 13.1 15.7* 12.6 12.3 12.6 12.6  HCT 39.9 42.3 52.4* 43.3 41.2 41.5 41.9  MCV 101.0* 104.4* 105.2* 104.8* 103.0* 101.2*  103.5*  PLT 205 214 249 226 199 213 210   Cardiac Enzymes: No results for input(s): CKTOTAL, CKMB, CKMBINDEX, TROPONINI in the last 168 hours. BNP: BNP (last 3 results)  Recent Labs  12/24/15 2000  BNP 26.7    ProBNP (last 3 results) No results for input(s): PROBNP in the last 8760 hours.  CBG: No results for input(s): GLUCAP in the last 168 hours.     SignedFlorencia Reasons MD, PhD  [facility] 01/01/2016, 9:41 AM

## 2016-01-01 NOTE — Progress Notes (Signed)
Discharge papers gone over with pt. And pt.'s nephew and niece. No complaints/questions. Pt. Discharged with PICC line clean/dry/intact. Pt. Connected to home oxygen tank. Pt. Discharged via w/c successfully.

## 2016-01-01 NOTE — Consult Note (Signed)
   [facility] CM Inpatient Consult   01/01/2016  Jasmine [name] August 04, 1946 [id number]   Patient evaluated for long-term disease management services with [place] Management program. Martin Majestic to bedside to discuss and offer [place] Management services. Patient is agreeable and written consent signed. Explained to patient that she will receive post hospital transition of care calls and will be evaluated for monthly home visits. Confirmed Primary Care MD as [name].  Confirmed best contact number as [phone] (niece's home number) or cell number as [phone]. Explained that [facility] Management will not interfere or replace services provided by home health.  Patient will have [place] as well. She will go home on IV antibiotics. She will also stay with her niece and nephew "as long as needed". Stephanie Corpening (HCPOA/niece) [phone]; Patrecia Pace (nephew) [phone]. Address will be [address], [place], [place] [address]. Discussed primary focus will be for COPD disease and symptom management for [facility] Care Management. [name] reports her husband passed last week. Offered support and encouragement.  Left [facility] Care Management packet and contact information at bedside. Will make inpatient RNCM aware that patient will be followed by [facility] Management post hospital discharge.   Marthenia Rolling, MSN-Ed, RN,BSN [facility] Liaison [phone]

## 2016-01-01 NOTE — Care Management Note (Signed)
Case Management Note  Patient Details  Name: Jasmine [name] MRN: [id number] Date of Birth: 04/19/1946  Subjective/Objective:                    Action/Plan:   Expected Discharge Date:                  Expected Discharge Plan:  [place]  In-House Referral:     Discharge planning Services  CM Consult  Post Acute Care Choice:    Choice offered to:  Patient  DME Arranged:    DME Agency:     HH Arranged:  RN, IV Antibiotics HH Agency:  [place]  Status of Service:  Completed, signed off  Medicare Important Message Given:  Yes Date Medicare IM Given:    Medicare IM give by:    Date Additional Medicare IM Given:    Additional Medicare Important Message give by:     If discussed at [place] of Stay Meetings, dates discussed:   01-01-16 Additional Comments: UR updated  Marilu Favre, RN 01/01/2016, 11:20 AM

## 2016-01-01 NOTE — Care Management Note (Signed)
Case Management Note  Patient Details  Name: Jasmine [name] MRN: [id number] Date of Birth: 1946/03/05  Subjective/Objective:                    Action/Plan:  Pam with [place] will start teching with patient's niece at 59 . Patient will have home health visit this afternoon for 2 pm dose .  Expected Discharge Date:                  Expected Discharge Plan:  [place]  In-House Referral:     Discharge planning Services  CM Consult  Post Acute Care Choice:    Choice offered to:  Patient  DME Arranged:    DME Agency:     HH Arranged:  RN, IV Antibiotics HH Agency:  [facility]  Status of Service:  Completed, signed off  Medicare Important Message Given:  Yes Date Medicare IM Given:    Medicare IM give by:    Date Additional Medicare IM Given:    Additional Medicare Important Message give by:     If discussed at [place] of Stay Meetings, dates discussed:    Additional Comments:  Marilu Favre, RN 01/01/2016, 9:57 AM

## 2016-01-03 ENCOUNTER — Inpatient Hospital Stay: Payer: Commercial Managed Care - HMO | Admitting: Internal Medicine

## 2016-01-03 ENCOUNTER — Other Ambulatory Visit: Payer: Self-pay | Admitting: *Deleted

## 2016-01-03 DIAGNOSIS — J449 Chronic obstructive pulmonary disease, unspecified: Secondary | ICD-10-CM | POA: Diagnosis not present

## 2016-01-03 DIAGNOSIS — I5032 Chronic diastolic (congestive) heart failure: Secondary | ICD-10-CM | POA: Diagnosis not present

## 2016-01-03 DIAGNOSIS — I10 Essential (primary) hypertension: Secondary | ICD-10-CM | POA: Diagnosis not present

## 2016-01-03 DIAGNOSIS — E039 Hypothyroidism, unspecified: Secondary | ICD-10-CM | POA: Diagnosis not present

## 2016-01-03 DIAGNOSIS — B9561 Methicillin susceptible Staphylococcus aureus infection as the cause of diseases classified elsewhere: Secondary | ICD-10-CM | POA: Diagnosis not present

## 2016-01-03 DIAGNOSIS — J45909 Unspecified asthma, uncomplicated: Secondary | ICD-10-CM | POA: Diagnosis not present

## 2016-01-03 DIAGNOSIS — B954 Other streptococcus as the cause of diseases classified elsewhere: Secondary | ICD-10-CM | POA: Diagnosis not present

## 2016-01-03 DIAGNOSIS — R7881 Bacteremia: Secondary | ICD-10-CM | POA: Diagnosis not present

## 2016-01-03 DIAGNOSIS — M5416 Radiculopathy, lumbar region: Secondary | ICD-10-CM | POA: Diagnosis not present

## 2016-01-03 NOTE — Patient Outreach (Signed)
[facility]) Care Management  01/03/2016  Jasmine [name] [birth year] [id number]   Assessment: Transition of care - initial call Referral received from hospital liaison Jasmine [name]) for COPD disease and symptom management. 70 year old female with recent hospitalization for bacteremia and COPD.  Call placed and spoke with patient briefly. She reports that this is her second day being out of the hospital. She mentions managing her own medications and taking them as prescribed. Patient mentions being on low salt diet.  Jasmine [name] shares that she currently stays with her niece who is assisting her as she recovers since her husband just passed away on January 13, 2023 while she was in the hospital. Patient's husband will have his funeral service this weekend. Patient expressed desire to have everything stable before able to talk about her health. She shares that she wants to give herself few days to settle after husband's funeral. Patient requests to be called back after Tuesday next week. Care management coordinator expressed understanding.   Plan: Will call patient back next week as requested to follow-up transition of care.   Jasmine [name] A. Jasmine [name], BSN, RN-BC Jasmine [name] Management Coordinator Cell: [phone]

## 2016-01-07 ENCOUNTER — Telehealth: Payer: Self-pay | Admitting: Internal Medicine

## 2016-01-07 DIAGNOSIS — B954 Other streptococcus as the cause of diseases classified elsewhere: Secondary | ICD-10-CM | POA: Diagnosis not present

## 2016-01-07 DIAGNOSIS — I5032 Chronic diastolic (congestive) heart failure: Secondary | ICD-10-CM | POA: Diagnosis not present

## 2016-01-07 DIAGNOSIS — E039 Hypothyroidism, unspecified: Secondary | ICD-10-CM | POA: Diagnosis not present

## 2016-01-07 DIAGNOSIS — B9561 Methicillin susceptible Staphylococcus aureus infection as the cause of diseases classified elsewhere: Secondary | ICD-10-CM | POA: Diagnosis not present

## 2016-01-07 DIAGNOSIS — J45909 Unspecified asthma, uncomplicated: Secondary | ICD-10-CM | POA: Diagnosis not present

## 2016-01-07 DIAGNOSIS — Z5181 Encounter for therapeutic drug level monitoring: Secondary | ICD-10-CM | POA: Diagnosis not present

## 2016-01-07 DIAGNOSIS — J449 Chronic obstructive pulmonary disease, unspecified: Secondary | ICD-10-CM | POA: Diagnosis not present

## 2016-01-07 DIAGNOSIS — I10 Essential (primary) hypertension: Secondary | ICD-10-CM | POA: Diagnosis not present

## 2016-01-07 DIAGNOSIS — R7881 Bacteremia: Secondary | ICD-10-CM | POA: Diagnosis not present

## 2016-01-07 DIAGNOSIS — M5416 Radiculopathy, lumbar region: Secondary | ICD-10-CM | POA: Diagnosis not present

## 2016-01-07 NOTE — Telephone Encounter (Signed)
Patient states she was not using cuff correctly, a family member came by and retook her bp x3 at rest, and she is getting normal readings, she doesn't feel she needs any further assistance from [facility]

## 2016-01-07 NOTE — Telephone Encounter (Signed)
Pt called in to make a hospital f/u appt. She said that [facility] took her bp today and it read 180/?[name] She states this is really high for her and she is not comfortable with this reading. She is requesting that [facility] come out again early tomorrow for another reading.

## 2016-01-08 ENCOUNTER — Telehealth: Payer: Self-pay | Admitting: Internal Medicine

## 2016-01-08 ENCOUNTER — Encounter: Payer: Self-pay | Admitting: Acute Care

## 2016-01-08 ENCOUNTER — Ambulatory Visit (INDEPENDENT_AMBULATORY_CARE_PROVIDER_SITE_OTHER): Payer: Commercial Managed Care - HMO | Admitting: Acute Care

## 2016-01-08 VITALS — BP 92/60 | HR 74 | Ht 60.0 in | Wt 90.2 lb

## 2016-01-08 DIAGNOSIS — J9620 Acute and chronic respiratory failure, unspecified whether with hypoxia or hypercapnia: Secondary | ICD-10-CM

## 2016-01-08 DIAGNOSIS — N898 Other specified noninflammatory disorders of vagina: Secondary | ICD-10-CM

## 2016-01-08 DIAGNOSIS — R911 Solitary pulmonary nodule: Secondary | ICD-10-CM

## 2016-01-08 DIAGNOSIS — J438 Other emphysema: Secondary | ICD-10-CM

## 2016-01-08 DIAGNOSIS — J449 Chronic obstructive pulmonary disease, unspecified: Secondary | ICD-10-CM

## 2016-01-08 NOTE — Patient Instructions (Addendum)
It is nice to meet you today. I am so sorry to hear of your recent loss. I am glad you are feeling so much better.Continue your Breo and Spiriva Continue using your albuterol inhaler as your rescue medication for wheezing or shortness of breath. Follow up with [name] on 01/15/16 as scheduled. [name] will do follow up labs at the  Appointment on 01/15/16. We will start the process of getting a lighter POC.. Continue to monitor your PICC line for any drainage, redness or pain. If you develop a fever call [place] and the office. Call [place] for any of the above. Keep the weight gain up.I am glad you have a good appetite. We will schedule a  CT scan of your chest to assess findings on a recent chest x ray. Follow up with Dr. Elsworth Soho in 3 months. Please contact office for sooner follow up if symptoms do not improve or worsen or seek emergency care

## 2016-01-08 NOTE — Telephone Encounter (Signed)
Requesting order for social work consult.  Patients husband recently died and needs assistants with applying for assisted living. Patient is also having yellow, thick vaginal discharge for awhile and does not know when it started.  She has had an obgyn but does not remember who this was.  Is requesting patient to be seen or referred to [facility].

## 2016-01-08 NOTE — Telephone Encounter (Signed)
Please advise, can we refer her?

## 2016-01-08 NOTE — Telephone Encounter (Signed)
Both referrals done.

## 2016-01-09 ENCOUNTER — Encounter: Payer: Self-pay | Admitting: *Deleted

## 2016-01-09 ENCOUNTER — Other Ambulatory Visit: Payer: Self-pay | Admitting: *Deleted

## 2016-01-09 ENCOUNTER — Telehealth: Payer: Self-pay | Admitting: *Deleted

## 2016-01-09 DIAGNOSIS — B9561 Methicillin susceptible Staphylococcus aureus infection as the cause of diseases classified elsewhere: Secondary | ICD-10-CM | POA: Diagnosis not present

## 2016-01-09 DIAGNOSIS — R911 Solitary pulmonary nodule: Secondary | ICD-10-CM | POA: Insufficient documentation

## 2016-01-09 DIAGNOSIS — M5416 Radiculopathy, lumbar region: Secondary | ICD-10-CM | POA: Diagnosis not present

## 2016-01-09 DIAGNOSIS — E039 Hypothyroidism, unspecified: Secondary | ICD-10-CM | POA: Diagnosis not present

## 2016-01-09 DIAGNOSIS — B954 Other streptococcus as the cause of diseases classified elsewhere: Secondary | ICD-10-CM | POA: Diagnosis not present

## 2016-01-09 DIAGNOSIS — R7881 Bacteremia: Secondary | ICD-10-CM | POA: Diagnosis not present

## 2016-01-09 DIAGNOSIS — J45909 Unspecified asthma, uncomplicated: Secondary | ICD-10-CM | POA: Diagnosis not present

## 2016-01-09 DIAGNOSIS — I10 Essential (primary) hypertension: Secondary | ICD-10-CM | POA: Diagnosis not present

## 2016-01-09 DIAGNOSIS — I5032 Chronic diastolic (congestive) heart failure: Secondary | ICD-10-CM | POA: Diagnosis not present

## 2016-01-09 DIAGNOSIS — J449 Chronic obstructive pulmonary disease, unspecified: Secondary | ICD-10-CM | POA: Diagnosis not present

## 2016-01-09 NOTE — Assessment & Plan Note (Addendum)
Recent hospitalization for COPD exacerbation and bacteremia with positive blood cultures for STAPHYLOCOCCUS AUREUS Pt. Discharged home 01/01/16  with IV Ancef x 10 days via right arm PICC. Looks and feels better. Plan: Continue IV Ancef as scheduled per [place] your Breo and Spiriva as maintenance  Continue using your albuterol inhaler as your rescue medication for wheezing or shortness of breath. Follow up with [name] on 01/15/16 as scheduled. [name] will do follow up labs at the  Appointment on 01/15/16. We will start the process of getting a lighter POC.. Continue to monitor your PICC line for any drainage, redness or pain. If you develop a fever call [place] and the office. Call [place] for any of the above. Follow up with Dr. Elsworth Soho in 3 months. Please contact office for sooner follow up if symptoms do not improve or worsen or seek emergency care

## 2016-01-09 NOTE — Telephone Encounter (Signed)
Left msg on triage stating pt was d/c from hosp, and she was out seeing pt going over medications. Pt is stating that she is taking Levothyroxine 50 mcg, but according to d/c summary she should be taking 75 mcg. Wanting to cerify which msg pt should be taking. Called Crystal back no answer LMOM when pt was in hosp levothyroxine was increase to 75 mcg...Johny Chess

## 2016-01-09 NOTE — Progress Notes (Signed)
Subjective:    Patient ID: Jasmine [name], female    DOB: 11-08-1945, 70 y.o.   MRN: [id number]  HPI  70 y.o smoker for FU of COPD .Seen for initial pulmonary eval 02/03/13  She was started on O2 in march 2014 & uses 33 liters 02 continuously and w/ sleep, and 4L pulsed with activity.  She has smoked Since her 36's - 59  Pyrs. Quit 07/21/2015.History of cocaine use in recent past. Husband passed away 01/23/16 while she was hospitalized. Significant tests/ events  CT chest 10/2011 >> showed Moderate centrilobular emphysema. Small bilateral pleural effusions with dependent atelectasis  01/12/13 EKG - P pulmonale   02/03/13 BMET - shows bicarb of 32  PFT >FEV1 at 0.47 L( 30%), ratio 39 , No BD response (<200cc)  2014 completed [facility] Admission for COPD exacerbation/Staphylococcus aureus bacteremia  Admit date: 12/27/2015 Discharge date: 01-23-16  12/27/2015:Chest Xray 2 view  IMPRESSION: Chronic fibrotic changes. Possible left upper lobe pulmonary nodule. Consider further evaluation with CT of the chest. Intravenous contrast is recommended unless it is contraindicated.  Hospital follow up office visit: 01/08/2016  Patient presents to the office today for hospital follow-up. She states she is much better. No shortness of breath, no chest pain, no cough or increase in mucus. She is on her baseline oxygen 4 L pulsed with activity, 3 L continuously at all other times. She denies fever, orthopnea, hemoptysis. She is getting her appetite back and eating well. She has a PICC line in her right arm through which she is receiving 10 days of IV Ancef for her bacteremia. The site is unremarkable, dressing occlusive and intact. She is accompanied by her niece who is administering her IV antibiotics with frequent checks by home health nursing. Of note the patient's husband passed away on 01/23/16. She is grieving.   Current outpatient prescriptions:  .  albuterol (PROVENTIL)  (2.5 MG/3ML) 0.083% nebulizer solution, Take 3 mLs (2.5 mg total) by nebulization every 4 (four) hours as needed for wheezing., Disp: 75 mL, Rfl: 12 .  ALPRAZolam (XANAX) 0.25 MG tablet, TAKE 1 TABLET BY MOUTH TWICE DAILY, Disp: 60 tablet, Rfl: 1 .  ceFAZolin (ANCEF) 1-5 GM-%, Inject 50 mLs (1 g total) into the vein every 8 (eight) hours. Please provide total of 10day supply., Disp: 50 mL, Rfl: 0 .  citalopram (CELEXA) 10 MG tablet, Take 1 tablet (10 mg total) by mouth daily., Disp: 30 tablet, Rfl: 3 .  feeding supplement, ENSURE ENLIVE, (ENSURE ENLIVE) LIQD, Take 237 mLs by mouth 3 (three) times daily between meals., Disp: 237 mL, Rfl: 12 .  fluticasone furoate-vilanterol (BREO ELLIPTA) 200-25 MCG/INH AEPB, Inhale 1 puff into the lungs daily., Disp: 1 each, Rfl: 5 .  furosemide (LASIX) 40 MG tablet, Take 1 tablet (40 mg total) by mouth daily., Disp: 30 tablet, Rfl: 3 .  guaiFENesin (MUCINEX) 600 MG 12 hr tablet, Take 1 tablet (600 mg total) by mouth 2 (two) times daily., Disp: 30 tablet, Rfl: 0 .  levothyroxine (SYNTHROID, LEVOTHROID) 75 MCG tablet, Take 1 tablet (75 mcg total) by mouth daily before breakfast., Disp: 30 tablet, Rfl: 0 .  OXYGEN, Inhale 4 L into the lungs continuous. , Disp: , Rfl:  .  tiotropium (SPIRIVA HANDIHALER) 18 MCG inhalation capsule, Place 1 capsule (18 mcg total) into inhaler and inhale daily., Disp: 30 capsule, Rfl: 2   Past Medical History  Diagnosis Date  . ALLERGIC RHINITIS 05/01/2007  . DEPRESSION 07/21/2008  .  FATIGUE 10/10/2010  . HYPERTENSION 05/01/2007  . LUMBAR RADICULOPATHY, RIGHT 08/25/2008  . OSTEOPOROSIS 05/01/2007  . SHOULDER PAIN, LEFT 08/23/2009  . SINUSITIS, CHRONIC 10/10/2010  . SINUSITIS- ACUTE-NOS 10/10/2008  . DVT (deep venous thrombosis) (HCC) 1960s    LLE  . CHF (congestive heart failure) ([place])   . HYPERTHYROIDISM 11/23/2007    Pt endorses having had Graves disease, possibly radioactive iodine x1, but no thyroidectomy  . HYPOTHYROIDISM 08/23/2009   . ASTHMA 05/01/2007    "since I was a child"  . Chronic bronchitis ([place])   . Arthritis     "hands" (12/28/2015)  . Anxiety   . On home oxygen therapy     "3-4L; qd; all the time" (12/28/2015)    Allergies  Allergen Reactions  . Alendronate Sodium Other (See Comments)    Pt does not remember this reaction    Review of Systems Constitutional:   +  weight loss, no night sweats,  Fevers, chills, fatigue, or  lassitude.  HEENT:   No headaches,  Difficulty swallowing,  Tooth/dental problems, or  Sore throat,                No sneezing, itching, ear ache, nasal congestion, post nasal drip,   CV:  No chest pain,  Orthopnea, PND, swelling in lower extremities, anasarca, dizziness, palpitations, syncope.   GI  No heartburn, indigestion, abdominal pain, nausea, vomiting, diarrhea, change in bowel habits, loss of appetite, bloody stools.   Resp: + shortness of breath with exertion not at rest.  No excess mucus, no productive cough,  No non-productive cough,  No coughing up of blood.  No change in color of mucus.  No wheezing.  No chest wall deformity  Skin: no rash or lesions.  GU: no dysuria, change in color of urine, no urgency or frequency.  No flank pain, no hematuria   MS:  No joint pain or swelling.  No decreased range of motion.  No back pain.  Psych:  No change in mood or affect. No depression or anxiety.  No memory loss.        Objective:   Physical Exam  BP 92/60 mmHg  Pulse 74  Ht 5' (1.524 m)  Wt 90 lb 3.2 oz (40.914 kg)  BMI 17.62 kg/m2  SpO2 99%  Physical Exam:  General- No distress,  A&Ox3, thin, frail female ENT: No sinus tenderness, TM clear, pale nasal mucosa, no oral exudate,no post nasal drip, no LAN Cardiac: S1, S2, regular rate and rhythm, no murmur Chest: No wheeze/ rales/ dullness; no accessory muscle use, no nasal flaring, no sternal retractions Abd.: Soft Non-tender Ext: No clubbing cyanosis, edema Neuro:  normal strength Skin: No rashes, warm  and dry Psych: normal mood and behavior      Assessment & Plan:

## 2016-01-09 NOTE — Assessment & Plan Note (Signed)
Continue wearing your oxygen 3 liters continuously at home and 4 liters pulsed with activity.

## 2016-01-09 NOTE — Patient Outreach (Addendum)
[facility]) Care Management  01/09/2016  Jasmine [name] [birth year] [id number]   Assessment: Transition of care- week 1 Call placed and spoke with patient. She reports "feeling much better" but still grieves over husbands's passing away.   Patient was discharged home on 01/01/16 with IV antibiotics Ancef for 10 days via right arm PICC. She reports that [facility] nurse is assisting in managing antibiotics and PICC line at her niece's home where she temporarily lives at this time. Encouraged patient to monitor PICC line for any drainage, redness or pain and report if any.  Patient continues to use Breo and Spiriva as maintenance inhalers. She is aware to use Albuterol inhaler as rescue medication for wheezing or shortness of breath. Patient reports using Albuterol once since discharge from the hospital. According to patient, she gets short of breath with physical activities but not when at rest. She is on continuous home oxygen at 3 liters per minute when at home and 4 liters with activities. She reports coughing once in a while and taking Mucinex. She states that phlegm is lesser and mostly clear in color. She states that appetite is good and sleeps pretty well.  Patient has scheduled follow up appointment with [name] on 01/15/16. Patient was seen by Pulmonary nurse practitioner on 3/21 for post hospital follow-up and in the process of getting her a smaller/ lighter portable oxygen concentrator to use outside of home. Patient is to follow up with Dr. Elsworth Soho in 3 months (pulmonologist). Her niece provides transportation to her doctor's appointments when available. Patient was reminded of [employer] benefits for transportation if needed. Encouraged patient to call ahead of time to schedule for transportation with [facility] in case she needs one.  Patient denies of any urgent needs or issues at this time. She agreed to home visit next week. Encouraged patient to call  [facility] management coordinator or 24-hour nurse line if necessary. Contact information provided to patient.    Plan: Initial home visit on 01/17/16. Will provide [facility] COPD packet of information.  Ralyn Stlaurent A. Anes Rigel, BSN, RN-BC [employer] Management Coordinator Cell: [phone]

## 2016-01-09 NOTE — Addendum Note (Signed)
Addended by: Beckie Busing on: 01/09/2016 10:42 AM   Modules accepted: Orders

## 2016-01-10 ENCOUNTER — Ambulatory Visit: Payer: Commercial Managed Care - HMO | Admitting: Internal Medicine

## 2016-01-10 ENCOUNTER — Telehealth: Payer: Self-pay | Admitting: Internal Medicine

## 2016-01-10 ENCOUNTER — Other Ambulatory Visit: Payer: Self-pay | Admitting: Internal Medicine

## 2016-01-10 NOTE — Telephone Encounter (Signed)
Pt is out of her ALPRAZolam (XANAX) 0.25 MG tablet [id number] She has an appointment on Tues but is hoping to get enough sent in to pharmacy till she comes in. Pharmacy is [facility] Her best number is [phone]

## 2016-01-11 ENCOUNTER — Telehealth: Payer: Self-pay

## 2016-01-11 ENCOUNTER — Inpatient Hospital Stay: Payer: Commercial Managed Care - HMO | Admitting: Adult Health

## 2016-01-11 MED ORDER — ALPRAZOLAM 0.25 MG PO TABS: 0.2500 mg | ORAL_TABLET | Freq: Two times a day (BID) | ORAL | Status: DC

## 2016-01-11 NOTE — Addendum Note (Signed)
Addended by: Biagio Borg on: 01/11/2016 04:11 PM   Modules accepted: Orders

## 2016-01-11 NOTE — Telephone Encounter (Signed)
Please advise, patient is requesting refill on xanax

## 2016-01-11 NOTE — Telephone Encounter (Signed)
Pt called back again regarding encounter below Can you please give her a call

## 2016-01-11 NOTE — Telephone Encounter (Signed)
Done hardcopy to [name]  

## 2016-01-14 DIAGNOSIS — M5416 Radiculopathy, lumbar region: Secondary | ICD-10-CM | POA: Diagnosis not present

## 2016-01-14 DIAGNOSIS — J45909 Unspecified asthma, uncomplicated: Secondary | ICD-10-CM | POA: Diagnosis not present

## 2016-01-14 DIAGNOSIS — E039 Hypothyroidism, unspecified: Secondary | ICD-10-CM | POA: Diagnosis not present

## 2016-01-14 DIAGNOSIS — I5032 Chronic diastolic (congestive) heart failure: Secondary | ICD-10-CM | POA: Diagnosis not present

## 2016-01-14 DIAGNOSIS — B954 Other streptococcus as the cause of diseases classified elsewhere: Secondary | ICD-10-CM | POA: Diagnosis not present

## 2016-01-14 DIAGNOSIS — B9561 Methicillin susceptible Staphylococcus aureus infection as the cause of diseases classified elsewhere: Secondary | ICD-10-CM | POA: Diagnosis not present

## 2016-01-14 DIAGNOSIS — R7881 Bacteremia: Secondary | ICD-10-CM | POA: Diagnosis not present

## 2016-01-14 DIAGNOSIS — I10 Essential (primary) hypertension: Secondary | ICD-10-CM | POA: Diagnosis not present

## 2016-01-14 DIAGNOSIS — J449 Chronic obstructive pulmonary disease, unspecified: Secondary | ICD-10-CM | POA: Diagnosis not present

## 2016-01-14 NOTE — Telephone Encounter (Signed)
Medication has been sent to pharmacy.  °

## 2016-01-15 ENCOUNTER — Other Ambulatory Visit (INDEPENDENT_AMBULATORY_CARE_PROVIDER_SITE_OTHER): Payer: Commercial Managed Care - HMO

## 2016-01-15 ENCOUNTER — Ambulatory Visit (INDEPENDENT_AMBULATORY_CARE_PROVIDER_SITE_OTHER): Payer: Commercial Managed Care - HMO | Admitting: Pulmonary Disease

## 2016-01-15 ENCOUNTER — Ambulatory Visit (INDEPENDENT_AMBULATORY_CARE_PROVIDER_SITE_OTHER): Payer: Commercial Managed Care - HMO | Admitting: Internal Medicine

## 2016-01-15 ENCOUNTER — Encounter: Payer: Self-pay | Admitting: Internal Medicine

## 2016-01-15 ENCOUNTER — Telehealth: Payer: Self-pay | Admitting: Acute Care

## 2016-01-15 VITALS — BP 120/60 | HR 103 | Temp 98.4°F | Resp 20 | Wt 96.0 lb

## 2016-01-15 DIAGNOSIS — I5032 Chronic diastolic (congestive) heart failure: Secondary | ICD-10-CM

## 2016-01-15 DIAGNOSIS — R7881 Bacteremia: Secondary | ICD-10-CM | POA: Diagnosis not present

## 2016-01-15 DIAGNOSIS — E039 Hypothyroidism, unspecified: Secondary | ICD-10-CM

## 2016-01-15 DIAGNOSIS — J438 Other emphysema: Secondary | ICD-10-CM

## 2016-01-15 DIAGNOSIS — I10 Essential (primary) hypertension: Secondary | ICD-10-CM

## 2016-01-15 DIAGNOSIS — J9611 Chronic respiratory failure with hypoxia: Secondary | ICD-10-CM | POA: Diagnosis not present

## 2016-01-15 NOTE — Telephone Encounter (Signed)
Amb Ox done today to qualify O2 Rodena Piety please see patient care coordination notes to complete order.  Thanks.

## 2016-01-15 NOTE — Progress Notes (Signed)
Pre visit review using our clinic review tool, if applicable. No additional management support is needed unless otherwise documented below in the visit note. 

## 2016-01-15 NOTE — Telephone Encounter (Signed)
When patient was in office with Eric Form, NP 01/08/16 a qualifying walk should have been done at this visit.  Called and spoke with pt to make aware that before the DME will process the order for the POC they need qualifying O2 sats for insurance purposes.  Pt is actually in the building currently seeing Dr Jenny Reichmann (1st floor) and states that she will come up to our floor once she is finished down there.  Pt aware to check in and ask for Vaudie Engebretsen to do her walk.  Will hold in triage to document walk once complete.

## 2016-01-15 NOTE — Patient Instructions (Signed)

## 2016-01-15 NOTE — Progress Notes (Signed)
Reviewed & agree with plan  

## 2016-01-15 NOTE — Progress Notes (Signed)
Subjective:    Patient ID: Jasmine [name], female    DOB: [birth year], 70 y.o.   MRN: [id number]  HPI  Here to f/u post hosp d/c mar 14, has finished IV antibx, PICC remains in place, for labs today and consider d/c PICC.  Will also need TSH f/u at 4-6 wks. Pt denies chest pain, increased sob or doe, wheezing, orthopnea, PND, increased LE swelling, palpitations, dizziness or syncope.  Pt denies new neurological symptoms such as new headache, or facial or extremity weakness or numbness   Pt denies polydipsia, polyuria.  Had f/u with Pulm 3/22, doing well.  Appetite improving.   Considering independent living, currently living with niece since hospn, husband died recently. Past Medical History  Diagnosis Date  . ALLERGIC RHINITIS 05/01/2007  . DEPRESSION 07/21/2008  . FATIGUE 10/10/2010  . HYPERTENSION 05/01/2007  . LUMBAR RADICULOPATHY, RIGHT 08/25/2008  . OSTEOPOROSIS 05/01/2007  . SHOULDER PAIN, LEFT 08/23/2009  . SINUSITIS, CHRONIC 10/10/2010  . SINUSITIS- ACUTE-NOS 10/10/2008  . DVT (deep venous thrombosis) (HCC) 1960s    LLE  . CHF (congestive heart failure) ([place])   . HYPERTHYROIDISM 11/23/2007    Pt endorses having had Graves disease, possibly radioactive iodine x1, but no thyroidectomy  . HYPOTHYROIDISM 08/23/2009  . ASTHMA 05/01/2007    "since I was a child"  . Chronic bronchitis ([place])   . Arthritis     "hands" (12/28/2015)  . Anxiety   . On home oxygen therapy     "3-4L; qd; all the time" (12/28/2015)   Past Surgical History  Procedure Laterality Date  . Appendectomy    . Ectopic pregnancy surgery  "late '60s or early '70s  . Oophorectomy Right   . Electrocardiogram  06/20/2006  . Dilation and curettage of uterus  multiple    history of multiple dialations and curettages and miscarriages, unfortunately never carrying a child to term    reports that she quit smoking about 5 months ago. Her smoking use included Cigarettes. She has a 54 pack-year smoking history. She has never used  smokeless tobacco. She reports that she drinks alcohol. She reports that she uses illicit drugs ("Crack" cocaine) about twice per week. family history includes Alcohol abuse in her brother; Diabetes in her brother; Hypertension in her other; Lung cancer in her father; Stroke in her mother; Thyroid disease in her mother. Allergies  Allergen Reactions  . Alendronate Sodium Other (See Comments)    Pt does not remember this reaction   Current Outpatient Prescriptions on File Prior to Visit  Medication Sig Dispense Refill  . albuterol (PROVENTIL) (2.5 MG/3ML) 0.083% nebulizer solution Take 3 mLs (2.5 mg total) by nebulization every 4 (four) hours as needed for wheezing. (Patient not taking: Reported on 01/17/2016) 75 mL 12  . ALPRAZolam (XANAX) 0.25 MG tablet Take 1 tablet (0.25 mg total) by mouth 2 (two) times daily. 60 tablet 1  . ceFAZolin (ANCEF) 1-5 GM-% Inject 50 mLs (1 g total) into the vein every 8 (eight) hours. Please provide total of 10day supply. (Patient not taking: Reported on 01/17/2016) 50 mL 0  . citalopram (CELEXA) 10 MG tablet Take 1 tablet (10 mg total) by mouth daily. 30 tablet 3  . feeding supplement, ENSURE ENLIVE, (ENSURE ENLIVE) LIQD Take 237 mLs by mouth 3 (three) times daily between meals. 237 mL 12  . fluticasone furoate-vilanterol (BREO ELLIPTA) 200-25 MCG/INH AEPB Inhale 1 puff into the lungs daily. 1 each 5  . furosemide (LASIX) 40 MG tablet Take 1  tablet (40 mg total) by mouth daily. 30 tablet 3  . guaiFENesin (MUCINEX) 600 MG 12 hr tablet Take 1 tablet (600 mg total) by mouth 2 (two) times daily. 30 tablet 0  . levothyroxine (SYNTHROID, LEVOTHROID) 75 MCG tablet Take 1 tablet (75 mcg total) by mouth daily before breakfast. 30 tablet 0  . OXYGEN Inhale 4 L into the lungs continuous.     [name] tiotropium (SPIRIVA HANDIHALER) 18 MCG inhalation capsule Place 1 capsule (18 mcg total) into inhaler and inhale daily. 30 capsule 2   No current facility-administered medications on  file prior to visit.    Review of Systems  Constitutional: Negative for unusual diaphoresis or night sweats HENT: Negative for ear swelling or discharge Eyes: Negative for worsening visual haziness  Respiratory: Negative for choking and stridor.   Gastrointestinal: Negative for distension or worsening eructation Genitourinary: Negative for retention or change in urine volume.  Musculoskeletal: Negative for other MSK pain or swelling Skin: Negative for color change and worsening wound Neurological: Negative for tremors and numbness other than noted  Psychiatric/Behavioral: Negative for decreased concentration or agitation other than above       Objective:   Physical Exam BP 120/60 mmHg  Pulse 103  Temp(Src) 98.4 F (36.9 C) (Oral)  Resp 20  Wt 96 lb (43.545 kg)  SpO2 96% VS noted, on home o2 Constitutional: Pt appears in no apparent distress HENT: Head: NCAT.  Right Ear: External ear normal.  Left Ear: External ear normal.  Eyes: . Pupils are equal, round, and reactive to light. Conjunctivae and EOM are normal Neck: Normal range of motion. Neck supple.  Cardiovascular: Normal rate and regular rhythm.   Pulmonary/Chest: Effort normal and breath sounds decreased without rales or wheezing.  Abd:  Soft, NT, ND, + BS Neurological: Pt is alert. Not confused , motor grossly intact Skin: Skin is warm. No rash, no LE edema Psychiatric: Pt behavior is normal. No agitation.     Assessment & Plan:

## 2016-01-15 NOTE — Patient Instructions (Signed)
Pt came in for qualifying O2 walk (NO CHARGE)  See telephone note 01/15/16, will send note to Rodena Piety to complete order for [facility] P[place] to  [facility].  Will close this encounter.

## 2016-01-16 ENCOUNTER — Telehealth: Payer: Self-pay | Admitting: Internal Medicine

## 2016-01-16 ENCOUNTER — Telehealth: Payer: Self-pay | Admitting: *Deleted

## 2016-01-16 ENCOUNTER — Encounter: Payer: Self-pay | Admitting: Internal Medicine

## 2016-01-16 ENCOUNTER — Encounter: Payer: Self-pay | Admitting: Obstetrics and Gynecology

## 2016-01-16 ENCOUNTER — Ambulatory Visit (INDEPENDENT_AMBULATORY_CARE_PROVIDER_SITE_OTHER): Payer: Commercial Managed Care - HMO | Admitting: Obstetrics and Gynecology

## 2016-01-16 VITALS — BP 118/60 | HR 88 | Resp 18 | Ht 60.0 in | Wt 95.0 lb

## 2016-01-16 DIAGNOSIS — Z01419 Encounter for gynecological examination (general) (routine) without abnormal findings: Secondary | ICD-10-CM

## 2016-01-16 DIAGNOSIS — N898 Other specified noninflammatory disorders of vagina: Secondary | ICD-10-CM | POA: Diagnosis not present

## 2016-01-16 DIAGNOSIS — Z124 Encounter for screening for malignant neoplasm of cervix: Secondary | ICD-10-CM

## 2016-01-16 DIAGNOSIS — N760 Acute vaginitis: Secondary | ICD-10-CM | POA: Diagnosis not present

## 2016-01-16 DIAGNOSIS — Z1211 Encounter for screening for malignant neoplasm of colon: Secondary | ICD-10-CM

## 2016-01-16 LAB — CBC WITH DIFFERENTIAL/PLATELET
BASOS ABS: 0 10*3/uL (ref 0.0–0.1)
BASOS PCT: 0.5 % (ref 0.0–3.0)
EOS ABS: 0.2 10*3/uL (ref 0.0–0.7)
Eosinophils Relative: 2.2 % (ref 0.0–5.0)
HEMATOCRIT: 39.7 % (ref 36.0–46.0)
HEMOGLOBIN: 12.6 g/dL (ref 12.0–15.0)
LYMPHS PCT: 24.3 % (ref 12.0–46.0)
Lymphs Abs: 1.8 10*3/uL (ref 0.7–4.0)
MCHC: 31.8 g/dL (ref 30.0–36.0)
MCV: 98.1 fl (ref 78.0–100.0)
MONOS PCT: 8.5 % (ref 3.0–12.0)
Monocytes Absolute: 0.6 10*3/uL (ref 0.1–1.0)
NEUTROS ABS: 4.7 10*3/uL (ref 1.4–7.7)
Neutrophils Relative %: 64.5 % (ref 43.0–77.0)
PLATELETS: 266 10*3/uL (ref 150.0–400.0)
RBC: 4.04 Mil/uL (ref 3.87–5.11)
RDW: 15.7 % — ABNORMAL HIGH (ref 11.5–15.5)
WBC: 7.3 10*3/uL (ref 4.0–10.5)

## 2016-01-16 LAB — BASIC METABOLIC PANEL
BUN: 24 mg/dL — AB (ref 6–23)
CHLORIDE: 91 meq/L — AB (ref 96–112)
CO2: 46 mEq/L — ABNORMAL HIGH (ref 19–32)
CREATININE: 0.68 mg/dL (ref 0.40–1.20)
Calcium: 9.3 mg/dL (ref 8.4–10.5)
GFR: 109.63 mL/min (ref >60.00)
GLUCOSE: 93 mg/dL (ref 70–99)
POTASSIUM: 4.3 meq/L (ref 3.5–5.1)
Sodium: 140 mEq/L (ref 135–145)

## 2016-01-16 NOTE — Telephone Encounter (Addendum)
Dr. Megan Salon is wanting to know how the pt is doing.  Last dose of antibiotics was 3.23.17.  Pt still has PICC.  Keturah Shavers, RN shared that the pt is doing well, no problems.  Will pt need HSFU?  Will need to call order to [facility] if PICC is to be discontinued.  Dr. Megan Salon please advise.

## 2016-01-16 NOTE — Telephone Encounter (Signed)
I spoke with patient and she is doing well. She stated she has completed IV antibiotics but PICC is still in place.  I called [facility] Nurse, Andee Poles and gave verbal order per Dr Megan Salon to remove PICC.( message left on voice mail  [phone]).  Laverle Patter, RN

## 2016-01-16 NOTE — Telephone Encounter (Signed)
The results of the walk and the POC order form that Eric Form had signed has been faxed to [place] and received confirmation from the fax that it was received
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
## 2016-01-16 NOTE — Telephone Encounter (Signed)
Please see if she can followup with me tomorrow morning.

## 2016-01-16 NOTE — Telephone Encounter (Signed)
Would like a call back.  States she had an office of a Dr. Megan Salon call her to come to an appointment.  Patient does not have any information about this doctor and wanted to know if there is something in her chart somewhere about this MD.

## 2016-01-16 NOTE — Patient Instructions (Signed)

## 2016-01-17 ENCOUNTER — Other Ambulatory Visit: Payer: Self-pay | Admitting: *Deleted

## 2016-01-17 ENCOUNTER — Telehealth: Payer: Self-pay | Admitting: Internal Medicine

## 2016-01-17 ENCOUNTER — Telehealth: Payer: Self-pay | Admitting: *Deleted

## 2016-01-17 DIAGNOSIS — B9561 Methicillin susceptible Staphylococcus aureus infection as the cause of diseases classified elsewhere: Secondary | ICD-10-CM | POA: Diagnosis not present

## 2016-01-17 DIAGNOSIS — R7881 Bacteremia: Secondary | ICD-10-CM | POA: Diagnosis not present

## 2016-01-17 DIAGNOSIS — I5032 Chronic diastolic (congestive) heart failure: Secondary | ICD-10-CM | POA: Diagnosis not present

## 2016-01-17 DIAGNOSIS — E039 Hypothyroidism, unspecified: Secondary | ICD-10-CM | POA: Diagnosis not present

## 2016-01-17 DIAGNOSIS — J449 Chronic obstructive pulmonary disease, unspecified: Secondary | ICD-10-CM | POA: Diagnosis not present

## 2016-01-17 DIAGNOSIS — M5416 Radiculopathy, lumbar region: Secondary | ICD-10-CM | POA: Diagnosis not present

## 2016-01-17 DIAGNOSIS — J45909 Unspecified asthma, uncomplicated: Secondary | ICD-10-CM | POA: Diagnosis not present

## 2016-01-17 DIAGNOSIS — I10 Essential (primary) hypertension: Secondary | ICD-10-CM | POA: Diagnosis not present

## 2016-01-17 DIAGNOSIS — B954 Other streptococcus as the cause of diseases classified elsewhere: Secondary | ICD-10-CM | POA: Diagnosis not present

## 2016-01-17 LAB — WET PREP BY MOLECULAR PROBE
CANDIDA SPECIES: NEGATIVE
Gardnerella vaginalis: NEGATIVE
Trichomonas vaginosis: NEGATIVE

## 2016-01-17 LAB — IPS PAP TEST WITH REFLEX TO HPV

## 2016-01-17 NOTE — Telephone Encounter (Signed)
LMTC in regards to lab results -eh 

## 2016-01-17 NOTE — Telephone Encounter (Signed)
-----   Message from Salvadore Dom, MD sent at 01/17/2016  9:59 AM EDT ----- Please advise the patient of normal results. Pap is pending

## 2016-01-17 NOTE — Patient Outreach (Signed)
Jasmine [name]) Care Management   01/17/2016  Jasmine [name] 1946/04/30 [id number]  Jasmine [name] is an 70 y.o. female  Subjective: Patient verbalized "getting better and stronger" and being able to walk without a walker on most times. She also mentioned driving herself to her doctors' appointments, although she still moves slow as verbalized.  Patient reports seeing her primary care provider and pulmonologist on 3/28 and gynecologist saw her yesterday 3/29.  Objective: BP 106/60 mmHg  Pulse 74  Resp 16  SpO2 95%   Review of Systems  Constitutional: Negative.   HENT: Negative.   Eyes:       Wears eyeglasses  Respiratory: Positive for cough and sputum production. Negative for wheezing.        Patient reports very little non-productive cough with small whitish phlegm Uses home oxygen continuously at 3 liters and 4 liters with activity Shortness of breath with physical exertion Uses 2 pillows in bed to breath well.  Cardiovascular: Positive for leg swelling. Negative for chest pain.        Left leg/ankle with 2+ pitting edema Regular rate and rhythm  Gastrointestinal: Negative.  Negative for abdominal pain.       Positive bowel sounds Abdomen soft, non-tender & non-distended  Genitourinary: Negative.   Musculoskeletal: Negative.  Negative for falls.  Skin: Negative.        Occlusive dressing to right upper arm intact (old site of PICC line)  Neurological: Negative.   Endo/Heme/Allergies: Negative.   Psychiatric/Behavioral: Negative.     Physical Exam  Current Medications:   Current Outpatient Prescriptions  Medication Sig Dispense Refill  . ALPRAZolam (XANAX) 0.25 MG tablet Take 1 tablet (0.25 mg total) by mouth 2 (two) times daily. 60 tablet 1  . citalopram (CELEXA) 10 MG tablet Take 1 tablet (10 mg total) by mouth daily. 30 tablet 3  . feeding supplement, ENSURE ENLIVE, (ENSURE ENLIVE) LIQD Take 237 mLs by mouth 3 (three) times daily between meals. 237  mL 12  . fluticasone furoate-vilanterol (BREO ELLIPTA) 200-25 MCG/INH AEPB Inhale 1 puff into the lungs daily. 1 each 5  . furosemide (LASIX) 40 MG tablet Take 1 tablet (40 mg total) by mouth daily. 30 tablet 3  . guaiFENesin (MUCINEX) 600 MG 12 hr tablet Take 1 tablet (600 mg total) by mouth 2 (two) times daily. 30 tablet 0  . levothyroxine (SYNTHROID, LEVOTHROID) 75 MCG tablet Take 1 tablet (75 mcg total) by mouth daily before breakfast. 30 tablet 0  . OXYGEN Inhale 4 L into the lungs continuous.     . potassium chloride (K-DUR,KLOR-CON) 10 MEQ tablet Take 10 mEq by mouth 2 (two) times daily.    [name] tiotropium (SPIRIVA HANDIHALER) 18 MCG inhalation capsule Place 1 capsule (18 mcg total) into inhaler and inhale daily. 30 capsule 2  . albuterol (PROVENTIL) (2.5 MG/3ML) 0.083% nebulizer solution Take 3 mLs (2.5 mg total) by nebulization every 4 (four) hours as needed for wheezing. (Patient not taking: Reported on 01/17/2016) 75 mL 12  . ceFAZolin (ANCEF) 1-5 GM-% Inject 50 mLs (1 g total) into the vein every 8 (eight) hours. Please provide total of 10day supply. (Patient not taking: Reported on 01/17/2016) 50 mL 0   No current facility-administered medications for this visit.    Functional Status:   In your present state of health, do you have any difficulty performing the following activities: 01/09/2016 12/28/2015  Hearing? [place]? N -  Difficulty concentrating or making decisions? Y -  Walking or climbing stairs? Y -  Dressing or bathing? N -  Doing errands, shopping? Y N  Preparing Food and eating ? Y -  Using the Toilet? N -  In the past six months, have you accidently leaked urine? N -  Do you have problems with loss of bowel control? N -  Managing your Medications? N -  Managing your Finances? N -  Housekeeping or managing your Housekeeping? Y -    Fall/Depression Screening:    PHQ 2/9 Scores 01/09/2016 07/11/2013  PHQ - 2 Score 1 3  PHQ- 9 Score - 4    Assessment:   70 year  old female being seen for COPD disease and symptom management.  Arrived at patient's temporary residence at her niece's apartment. Patient's niece and nephew were present during the visit and are both supportive of patient's care. They provide transportation if patient is unable to drive herself.   Patient reports that antibiotic Ancef was completed last week. [facility] nurse just left when care management coordinator arrived. Patient reports that home health nurse removed PICC line from her right upper arm today. Encouraged patient to monitor site for any redness, warmth, drainage or pain (signs/symptoms of infection) and report to provider if any.    Patient mentioned that her appetite is very good and sleeps well. She takes Ensure 3 times a day for nutritional supplement. Her weight today is 94.4 pounds. Her main exercise is walking and had expressed interest in joining her niece to attend exercises at [employer]. [facility]N COPD packet, THN calendar/ notebook provided and discussed with patient She shares following diet of low salt, heart healthy. Care management coordinator went over food in their pantry and refrigerator with patient. She demonstrated reading labels to be as close to her diet as possible.    Patient was encouraged and assisted during this visit to call primary provider's office to verify Levothyroxine dose. Patient was told by office staff that she will be called back about correct dosage.  Patient had mentioned about her husband (who just recently passed) several times during the visit and she states that she is coping well with her loss.  Reinforced smoking cessation with patient and provided educational material and discussed.   Patient eventually plans to go to assisted living or independent living facility since she does not want to go back to her home. She was made aware to notify care management coordinator if she is ready or needs help coordinate for placement in order  to be referred to [facility] social worker.  Patient denies any other needs or concerns at this time. She agreed to transition of care call next week. Encourage patient to call [facility] management coordinator or 24 -hour nurse line when needed. Contact information provided.   Plan: Transition of care call on 01/23/16.   THN CM Care Plan Problem One        Most Recent Value   Care Plan Problem One  at risk for redamission   Role Documenting the Problem One  Care Management [place] for Problem One  Active   THN Long Term Goal (31-90 days)  patient will not be readmitted in the next 31 days   THN Long Term Goal Start Date  01/17/16   Interventions for Problem One Long Term Goal  encourage patient to adhere to medications as ordered (verify Levothyroxine dose with primary care provider),  encourage attendance to scheduled provider appointments,  provide [name] COPD packet  and discuss,  encourage patient to identify how she feels everyday using the COPD zone tool and when to contact provider for help,  encourage patient to exercise inorder to build endurance,  monitor for increased shortness of breath & wheezing take rescue medication    THN CM Short Term Goal #1 (0-30 days)  patient will report attending exercise program/ classes twice a week in the next 30 days   THN CM Short Term Goal #1 Start Date  01/17/16   Interventions for Short Term Goal #1  encourage patient to join her niece to [employer] for exercises,  encourage patient to start slow to build endurance then progress gradually as tolerated,  encourage patient to continue walking as part her of exercises    THN CM Short Term Goal #2 (0-30 days)  patient will report walking about 30 minutes 3 times a week in the next 30 days   THN CM Short Term Goal #2 Start Date  01/17/16   Interventions for Short Term Goal #2  encourage patient to walk as part of her exercise as tolerated,  encourage rest periods when needed to prevent shortness  of breath and wheezing    THN CM Care Plan Problem Two        Most Recent Value   Care Plan Problem Two  knowledge deficit regarding COPD management   Role Documenting the Problem Two  Care Management Coordinator   Care Plan for Problem Two  Active   Interventions for Problem Two Long Term Goal   provide [name] COPD packet and explain COPD action plan and COPD zone tool,  encourage to use COPD zone tool to identify how she feels,  encourage patient to use COPD action plan to identify signs and symptoms of  COPD flare-up and when to call tha doctor for help,  encourage medication adherence especially using inhalers and nebulization treatments,  encourage family to bring nebulizing machine from her home to her niece apartment where she is currently staying   [facility] Long Term Goal (31-90) days  patient will verbalize at least 3 strategies of managing COPD in the next 31 days   THN Long Term Goal Start Date  01/17/16   THN CM Short Term Goal #1 (0-30 days)  patient will identify the COPD zones and when to call the doctor for help in the next 30 days   THN CM Short Term Goal #1 Start Date  01/17/16   Interventions for Short Term Goal #2   provide [name] COPD packet and explain the different COPD zones,  check patient's understanding using teach back method,  encourage patient to know how she feels everyday using the COPD zone tool,  provide COPD magnet and place on the refrigerator door for easier access and visualization   THN CM Short Term Goal #2 (0-30 days)  patient will verbalize the COPD action plan in the next 30 days   THN CM Short Term Goal #2 Start Date  01/17/16   Interventions for Short Term Goal #2  provide and discuss [facility]N COPD packet,  explain the COPD action plan and when to call provider for help,  provide COPD magnet to place on refrigerator door to visualize and access it easier,  review patient's understanding using teach back method      Llewyn Heap A. Kameko Hukill, BSN, RN-BC [place]  Management Coordinator Cell: [phone]

## 2016-01-18 ENCOUNTER — Encounter: Payer: Self-pay | Admitting: *Deleted

## 2016-01-18 NOTE — Assessment & Plan Note (Signed)
stable overall by history and exam, recent data reviewed with pt, and pt to continue medical treatment as before,  to f/u any worsening symptoms or concerns SpO2 Readings from Last 3 Encounters:  01/17/16 95%  01/15/16 96%  01/08/16 99%

## 2016-01-18 NOTE — Assessment & Plan Note (Addendum)
For f/u labs today, has finished VI ancef, consider d/c PICC  Note:  Total time for pt hx, exam, review of record with pt in the room, determination of diagnoses and plan for further eval and tx is > 40 min, with over 50% spent in coordination and counseling of patient

## 2016-01-18 NOTE — Assessment & Plan Note (Signed)
stable overall by history and exam, recent data reviewed with pt, and pt to continue medical treatment as before,  to f/u any worsening symptoms or concerns Lab Results  Component Value Date   WBC 7.3 01/15/2016   HGB 12.6 01/15/2016   HCT 39.7 01/15/2016   PLT 266.0 01/15/2016   GLUCOSE 93 01/15/2016   CHOL 184 01/11/2015   TRIG 60.0 01/11/2015   HDL 84.30 01/11/2015   LDLDIRECT 102.1 10/10/2010   LDLCALC 88 01/11/2015   ALT 18 12/28/2015   AST 21 12/28/2015   NA 140 01/15/2016   K 4.3 01/15/2016   CL 91* 01/15/2016   CREATININE 0.68 01/15/2016   BUN 24* 01/15/2016   CO2 46* 01/15/2016   TSH 6.076* 12/30/2015   HGBA1C 6.3* 12/29/2015

## 2016-01-18 NOTE — Assessment & Plan Note (Signed)
For future f/u TSH, asymtp,  to f/u any worsening symptoms or concerns

## 2016-01-18 NOTE — Assessment & Plan Note (Signed)
stable overall by history and exam, recent data reviewed with pt, and pt to continue medical treatment as before,  to f/u any worsening symptoms or concerns BP Readings from Last 3 Encounters:  01/17/16 106/60  01/16/16 118/60  01/15/16 120/60

## 2016-01-23 ENCOUNTER — Other Ambulatory Visit: Payer: Self-pay | Admitting: *Deleted

## 2016-01-23 DIAGNOSIS — J441 Chronic obstructive pulmonary disease with (acute) exacerbation: Secondary | ICD-10-CM

## 2016-01-23 NOTE — Patient Outreach (Signed)
[facility]) Care Management  01/23/2016  Jasmine [name] [birth year] [id number]   Assessment: Transition of care- week 3 Call placed and spoke with patient who reports doing "pretty good and can't complain". Thoughts of deceased husband comes to her once in a while and she makes herself busy to get distracted as reported by patient. She reports being able to cope up with grief and verbalized no need for help at this point.   Patient reports old PICC site has no signs and symptoms of infection noted. She was able to get verification regarding Levothyroxine dose (75 mcg daily) from primary care provider.  Patient states that coughing is better and lesser with continued use of Mucinex. She denies any mucus production, fever, chest tightness, change in sleep pattern or appetite. She reports use of rescue medication about 4-5 times in the past week  especially in the mornings. Patient mentions being able to tolerate about 30 minutes to 1 hour without oxygen use. She usually wears oxygen with activities since she gets short of breath with it. Patient reports that she continues with walking and breathing exercises but unable to attend exercises at [employer] since she had been busy working on documents since husband died.   Patient expressed needing assistance with placement since she has decided not to go back to her house. Her niece will be undergoing surgery soon and will not be able to assist patient with her needs. Patient agreed to [facility] social work referral at this time.  Patient denies any additional needs or concerns at this time. She agreed to transition of care call next week. Encourage patient to call [facility] management coordinator or 24 -hour nurse line when needed. Contact information provided.   Plan: Transition of care call on 01/30/16 Will follow-up referral to [facility] work for assistance with placement.   Landon Truax A. Jordi Lacko, BSN, RN-BC [place]  Management Coordinator Cell: [phone]

## 2016-01-23 NOTE — Telephone Encounter (Signed)
Patient notified see result note 

## 2016-01-24 ENCOUNTER — Telehealth: Payer: Self-pay

## 2016-01-24 DIAGNOSIS — J449 Chronic obstructive pulmonary disease, unspecified: Secondary | ICD-10-CM | POA: Diagnosis not present

## 2016-01-24 DIAGNOSIS — J438 Other emphysema: Secondary | ICD-10-CM | POA: Diagnosis not present

## 2016-01-24 LAB — FECAL OCCULT BLOOD, IMMUNOCHEMICAL: IMMUNOLOGICAL FECAL OCCULT BLOOD TEST: NEGATIVE

## 2016-01-24 NOTE — Telephone Encounter (Signed)
Ok for 75 total  OK for 1.5 of the 50's to finish the 50's. thanks

## 2016-01-24 NOTE — Telephone Encounter (Signed)
Spoke with patient. Advised of normal IFOB results. She is agreeable and verbalizes understanding. Patient would like to update her address as she is staying with her family since her husband passed away. New address is [address] [place], [place] [address]. I have updated this on in her chart.  Routing to provider for final review. Patient agreeable to disposition. Will close encounter.

## 2016-01-24 NOTE — Telephone Encounter (Signed)
-----   Message from Jasmine Dom, MD sent at 01/24/2016 10:17 AM EDT ----- Please advise the patient of normal results.

## 2016-01-24 NOTE — Addendum Note (Signed)
Addended by: Graylon Good on: 01/24/2016 09:22 AM   Modules accepted: Orders

## 2016-01-24 NOTE — Telephone Encounter (Signed)
Crystal with [facility] called back stating she visited with pt who is still taking Levothyroxine 50 mcg instead of 75 mcg which she was discharged with from hospital. Pt recently picked up prescription for #90 tablets and does not want to purchase new medicine.  1 - please clarify correct levothyroxine dosage for pt since recent OV 2 - if she is to supposed to continue on 75 mcg, it is ok for her to take 1.5 tablets of the 50 mcg until finished?  Thanks!

## 2016-01-25 ENCOUNTER — Telehealth: Payer: Self-pay | Admitting: Endocrinology

## 2016-01-25 NOTE — Telephone Encounter (Signed)
Patient stated she was returning your call °

## 2016-01-25 NOTE — Telephone Encounter (Signed)
I contacted the pt and left a vm advising I had reviewed her chart and advised I had not contacted her and could not located where anyone in our office had. Requested a call back if the pt would like to discuss.

## 2016-01-25 NOTE — Telephone Encounter (Signed)
Pt advised in detail and expressed understanding 

## 2016-01-29 ENCOUNTER — Telehealth: Payer: Self-pay | Admitting: Pulmonary Disease

## 2016-01-29 ENCOUNTER — Telehealth: Payer: Self-pay | Admitting: Internal Medicine

## 2016-01-29 ENCOUNTER — Encounter: Payer: Self-pay | Admitting: Internal Medicine

## 2016-01-29 NOTE — Telephone Encounter (Signed)
Spoke with pt, states that her POC delivered by [name] is too heavy for her- not like the one RA discussed with her.  Verified with pt that she wears 4lpm 02 with exertion-advised that with her liter flow she may not be eligible for all POC systems, but that I'd check with [employer] to make sure no mistakes were made in her order. Called [name], states they will have a respiratory therapist contact patient today to discuss her options with a POC.  Spoke with pt to make aware.  Pt expressed understanding. Nothing further needed.

## 2016-01-29 NOTE — Telephone Encounter (Signed)
LVM for pt to return call

## 2016-01-29 NOTE — Telephone Encounter (Signed)
Pt returning call.Jasmine [name] ° °

## 2016-01-30 ENCOUNTER — Other Ambulatory Visit: Payer: Self-pay | Admitting: *Deleted

## 2016-01-30 ENCOUNTER — Encounter: Payer: Self-pay | Admitting: *Deleted

## 2016-01-30 NOTE — Telephone Encounter (Signed)
Left message for patient to call back  

## 2016-01-30 NOTE — Patient Outreach (Signed)
[facility]) Care Management  01/30/2016  Jasmine [name] [birth year] [id number]   Assessment: Transition of care- week 4 Call placed to speak with patient but unable to reach her. HIPAA compliant voice message left with name and contact number.   Attempted to call again twice to speak with patient but still unable to reach her. HIPAA compliant voice message left with name and contact information.   Plan: Will await for patient's return call. If unable to receive a call back, will reschedule for next outreach call.   ADDENDUM: Inbound call received from patient and was able to speak with her. She reports that she was on another line while care management coordinator was trying to contact her.   Patient verbalized that she "feels better" and "everyday makes a difference because of feeling more stronger physically and spiritually". Patient reports that her weight had been ranging from 95- 98 pounds. She reports that her sleep and appetite had been very good. She denies any signs and symptoms of COPD exacerbation at present, however, she reports having on and off watery, red, itching eyes possibly from allergy. She had reported use of rescue inhaler with relief about 5 times this week related to shortness of breath occuring mostly in the morning.  Patient mentioned that [employer] had sent her a portable oxygen concentrator which is heavier than what she currently has. She was able to contact [employer] to inform them of such and also notified pulmonologist office (Dr. Elsworth Soho) about it to see if it could be changed to a lighter concentrator.   Explained and reinforced to patient regarding [facility] nurse' recommendation about her Levothyroxine medication. She is currently using 1 and 1/2 tablets to total 75 mcg as ordered.   Patient encouraged to continue to walk as tolerated as part of her exercise. She is unable to join [employer] at this time since her niece (whom she  is suppose  to go with) is schedule to have surgery and will wait until surgery is over before she can go back.   Patient is awaiting for [facility] social worker to contact her in order to assist with placement to an assisted living or independent living facility.  Patient denies any additional needs or concerns at present. She agreed to routine home visit. Encouraged patient to call [facility], care management coordinator or 24-hour nurse line as necessary. Patient has contact information.   Plan: Routine home visit on 02/13/16.   Ramin Zoll A. Juliyah Mergen, BSN, RN-BC [place] Management Coordinator Cell: [phone]

## 2016-01-31 NOTE — Telephone Encounter (Signed)
Len Blalock, CMA at 01/29/2016 11:08 AM     Status: Signed       Expand All Collapse All   Spoke with pt, states that her POC delivered by [name] is too heavy for her- not like the one RA discussed with her. Verified with pt that she wears 4lpm 02 with exertion-advised that with her liter flow she may not be eligible for all POC systems, but that I'd check with [employer] to make sure no mistakes were made in her order. Called [name], states they will have a respiratory therapist contact patient today to discuss her options with a POC.  Spoke with pt to make aware. Pt expressed understanding. Nothing further needed.        Called and spoke to pt. Pt states she already spoke to [place] and was advised she has the smallest system she is able to tolerate with her liter flow, pt states she does not like her current system and it is too heavy. Pt called [employer] and got a list of preferred DME companies. Pt states she will call the different DME companies to see if they offer what she wants (and is able to tolerate with her liter flow). Pt states she will call back with an update.

## 2016-02-01 ENCOUNTER — Other Ambulatory Visit: Payer: Self-pay | Admitting: *Deleted

## 2016-02-01 NOTE — Patient Outreach (Signed)
[facility]) Care Management  02/01/2016  Jasmine [name] 1946/03/14 [id number]   CSW received a new referral on patient from patient's RNCM with [facility], Dannielle Huh.  Jasmine [name] reports that patient would benefit from social work services and resources to assist with placement into a long-term care assisted living facility.  Patient's husband, and primary caregiver, recently died so patient moved in with her niece and nephew, not wanting to live alone, or return to the house where they lived together for many years.  Patient's niece will undergo surgery last this month, which will prevent her from being able to adequately care for patient in the home. CSW made an initial attempt to try and contact patient today to perform phone assessment, as well as assess and assist with social needs and services, without success.  A HIPAA complaint message was left for patient on voicemail.  CSW is currently awaiting a return call.  Jasmine [name], BSW, MSW, LCSW  Licensed Education officer, environmental Health System  Mailing [place] [address] [place], [place] [address] Physical Address-[address], [place], [place] [address] Toll Free Main # [phone] Fax # [phone] Cell # [phone]  Fax # [phone]  [email or web address]  Humana  Discrimination is Against the [employer]. and its subsidiaries comply with applicable Federal civil rights laws and do not discriminate on the basis of race, color, national origin, age, disability, or sex. [place] do not exclude people or treat them differently because of race, color, national origin, age, disability, or sex.    Yahoo. and its subsidiaries provide:  . Free auxiliary aids and services, such as qualified sign language interpreters, video remote interpretation, and written information in other formats to  people with disabilities when such auxiliary aids and services are necessary to ensure an equal opportunity to participate. . Free language services to people whose primary language is not English when those services are necessary to provide meaningful access, such as translated documents or oral interpretation.    If you need these services, call [phone] or if you use a TTY, call 711.   If you believe that Yahoo. and its subsidiaries have failed to provide these services or discriminated in another way on the basis of race, color, national origin, age, disability, or sex, you can file a Tourist information centre manager with:   Discrimination [place] [address]   If you need help filing a grievance, call [phone] or if you use a TTY, call 711.  You can also file a civil rights complaint with the U.S. Department of Health and Financial controller, Office for Civil Rights electronically through the Office for Civil Rights Complaint Portal, available at OnSiteLending.nl.jsf, or by mail or phone at:   [place]. Department of Health and Human Services  [place], [place], [facility] Building  [place], [place]  [phone], [phone] (TDD)  Complaint forms are available at [email or web address]             [place]: ATTENTION: If you do not speak English, language assistance services, free of charge, are available to you. Call [phone]  (TTY: R9478181).  Espaol (Spanish): ATENCIN: si habla espaol, tiene a su disposicin servicios gratuitos de asistencia lingstica. Llame al [phone] (TTY: R9478181).  ???? (Chinese): ?????????????????????????????? [phone] (TTY:711??  Ti?ng Vi?t (Vietnamese): CH : N?u b?n ni Ti?ng Vi?t, c  cc d?ch v? h? tr? ngn ng? mi?n ph dnh cho b?n. G?i s? [phone] (TTY: R9478181).  ??? ([place]): ?? : ????  ????? ?? , ?? ?? ???? ??? ???? ? ???? . [phone] (TTY: 711)??? ??? ???? .  Tagalog (Tagalog - Filipino): PAUNAWA: Kung nagsasalita ka ng Tagalog, maaari kang gumamit ng mga serbisyo ng tulong sa wika nang walang bayad. [place] sa [phone] (TTY: R9478181).   [place]): :      ,      .  [phone] (: [id number]).  Kreyl Ayisyen ([place]): ATANSYON: Si w pale Ethelene Hal, gen svis d pou lang ki disponib gratis pou ou. Rele [phone] (TTY: R9478181).  Fonnie Jarvis Mar[place] Kitchen[place]): ATTENTION : Si vous parlez franais, des services d'aide linguistique vous sont proposs gratuitement. Appelez le [phone] (ATS : R9478181).  Polski (Polish): UWAGA: Jeeli mwisz po polsku, moesz skorzysta z bezpatnej pomocy jzykowej. Zadzwo pod numer [phone] (TTY: R9478181).  Portugus ([place]): ATENO: Se fala portugus, encontram-se disponveis servios lingusticos, grtis. Ligue para [phone] (TTY: R9478181).   Italiano ([place]): ATTENZIONE: In caso la lingua parlata sia l'italiano, sono disponibili servizi di assistenza linguistica gratuiti. Chiamare il numero [phone] (TTY: R9478181).  Dawayne Patricia ([place]): ACHTUNG: Wenn Sie Deutsch sprechen, stehen Ihnen kostenlos sprachliche Hilfsdienstleistungen zur [employer]. R[place]num[place]: [phone] (TTY: R9478181).   (Arabic): [phone]   .            : .)[id number] :   (  ??? ([place]): ??????????????????????????????????[phone] ?TTY?711?????????????????  ? (Farsi): [phone]  . ?   ? ?  ? ? ?~ ?  ?    : .??  (TTY: 711)  Din Bizaad ([place]): D77 baa ak0 n7n7zin: D77 saad bee y1n7[ti'go Risa Grill, saad bee 1k1'1n7da'1wo'd66', t'11 Pricilla Loveless n1 h0l=, koj8' h0d77lnih  [phone] (TTY: R9478181).

## 2016-02-05 NOTE — Telephone Encounter (Signed)
lmtcb for pt to follow up.

## 2016-02-06 NOTE — Telephone Encounter (Signed)
lmomtcb for pt 

## 2016-02-07 NOTE — Telephone Encounter (Signed)
LMOM TCB x3  Message closed and pt advised a new message will be created when she can call the office back

## 2016-02-11 ENCOUNTER — Other Ambulatory Visit: Payer: Self-pay | Admitting: *Deleted

## 2016-02-11 ENCOUNTER — Encounter: Payer: Self-pay | Admitting: *Deleted

## 2016-02-11 NOTE — Patient Outreach (Signed)
[facility]) Care Management  02/11/2016  Jasmine [name] [birth year] [id number]   CSW was able to make initial contact with patient today to perform phone assessment, as well as assess and assist with social work needs and services.  CSW introduced self, explained role and types of services provided through [facility] ([facility]).  CSW further explained to patient that CSW works with patient's RNCM, also with [facility], Jasmine [name]. CSW then explained the reason for the call, indicating that Jasmine [name] thought that patient would benefit from social work services and resources to assist with placement into a long-term care facility.  CSW obtained two HIPAA compliant identifiers from patient, which included patient's name and date of birth. Patient admits that she is interested in long-term care placement at [facility], [employer].  Patient requested that CSW contact the admissions coordinator at [facility] to check bed availability for patient.  Patient reports that she has been on the waiting list since before her husband's death, on 12-27-2022.  CSW agreed to contact [facility] and then follow back up with patient to report findings.  CSW also agreed to assist patient with the entire placement process.  Jasmine [name]  Licensed Education officer, environmental Health System  Mailing [place] [address] [name], [name] [address] Physical Address-[address], [place], [name] [address] Toll Free Main # [phone] Fax # [phone] Cell # [phone]  Fax # [phone]  [name] Humana  Discrimination is Against the [employer]. and its subsidiaries comply with applicable Federal civil rights laws and do not discriminate on the basis of race, color, national origin, age, disability, or sex. Why do not exclude people or treat them differently because of race, color, national origin, age, disability, or sex.    Yahoo. and its subsidiaries provide:  . Free auxiliary aids and services, such as qualified sign language interpreters, video remote interpretation, and written information in other formats to people with disabilities when such auxiliary aids and services are necessary to ensure an equal opportunity to participate. . Free language services to people whose primary language is not English when those services are necessary to provide meaningful access, such as translated documents or oral interpretation.    If you need these services, call [phone] or if you use a TTY, call 711.   If you believe that Yahoo. and its subsidiaries have failed to provide these services or discriminated in another way on the basis of race, color, national origin, age, disability, or sex, you can file a Tourist information centre manager with:   Discrimination [place]riev[place] [address]   If you need help filing a grievance, call [phone] or if you use a TTY, call 711.  You can also file a civil rights complaint with the U.S. Department of Health and Financial controller, Office for Civil Rights electronically through the Office for Civil Rights Complaint Portal, available at OnSiteLending.nl.jsf, or by mail or phone at:   [place]. Department of Health and Human Services  [place], [place], [facility] Building  [place], [place]  [phone], [phone] (TDD)  Complaint forms are available at [email or web address]             [place]  English: ATTENTION: If you do not speak English, language assistance services, free of charge, are available  to you. Call [phone]  (TTY: R9478181).  Espaol (Spanish): ATENCIN: si habla espaol, tiene a su  disposicin servicios gratuitos de asistencia lingstica. Llame al [phone] (TTY: R9478181).  ???? (Chinese): ?????????????????????????????? [phone] (TTY:711??  Ti?ng Vi?t (Vietnamese): CH : N?u b?n ni Ti?ng Vi?t, c cc d?ch v? h? tr? ngn ng? mi?n ph dnh cho b?n. G?i s? [phone] (TTY: R9478181).  ??? ([place]): ?? : ???? ????? ?? , ?? ?? ???? ??? ???? ? ???? . [phone] (TTY: 711)??? ??? ???? .  Tagalog (Tagalog - Filipino): PAUNAWA: Kung nagsasalita ka ng Tagalog, maaari kang gumamit ng mga serbisyo ng tulong sa wika nang walang bayad. [place] sa [phone] (TTY: R9478181).   [place]): :      ,      .  [phone] (: [id number]).  Kreyl Ayisyen ([place]): ATANSYON: Si w pale Ethelene Hal, gen svis d pou lang ki disponib gratis pou ou. Rele [phone] (TTY: R9478181).  Fonnie Jarvis Marland KitchenPak[place]): ATTENTION : Si vous parlez franais, des services d'aide linguistique vous sont proposs gratuitement. Appelez le [phone] (ATS : R9478181).  Polski (Polish): UWAGA: Jeeli mwisz po polsku, moesz skorzysta z bezpatnej pomocy jzykowej. Zadzwo pod numer [phone] (TTY: R9478181).  Portug[place] ([place]): ATENO: Se fala portugus, encontram-se disponveis servios lingusticos, grtis. Ligue para [phone] (TTY: R9478181).   Italiano ([place]): ATTENZIONE: In caso la lingua parlata sia l'italiano, sono disponibili servizi Jasmine assistenza linguistica gratuiti. Chiamare il numero [phone] (TTY: R9478181).  Jasmine [name] ([place]): ACHTUNG: Wenn Sie Deutsch sprechen, stehen Ihnen kostenlos sprachliche Hilfsdienstleistungen zur [employer]. Rufnum[place]: [phone] (TTY: R9478181).   (Arabic): [phone]   .            : .)[id number] :   (  ??? ([place]):  ??????????????????????????????????[phone] ?TTY?711?????????????????  ? (Farsi): [phone]  . ?   ? ?  ? ? ?~ ?  ?    : .??  (TTY: 711)  Din Bizaad ([place]): D77 baa ak0 n7n7zin: D77 saad bee y1n7[ti'go Risa Grill, saad bee 1k1'1n7da'1wo'd66', t'11 Jasmine [name] n1 h0l=, koj8' h0d77lnih [phone] (TTY: R9478181).

## 2016-02-13 ENCOUNTER — Encounter: Payer: Self-pay | Admitting: *Deleted

## 2016-02-13 ENCOUNTER — Telehealth: Payer: Self-pay | Admitting: Pulmonary Disease

## 2016-02-13 ENCOUNTER — Other Ambulatory Visit: Payer: Self-pay | Admitting: *Deleted

## 2016-02-13 NOTE — Patient Outreach (Signed)
[facility]) Care Management  02/13/2016  Jasmine [name] 1946/01/24 [id number]  CSW was able to make contact with Jasmine [name] today to follow-up with her regarding long-term care placement into an [place], [facility].  CSW explained to Jasmine [name] that [place] spoke with Sophronia Simas, Admissions Coordinator at [facility], who reports that they currently have a female bed available for Jasmine [name].  Mrs. Mariea Clonts admitted that she was not aware of Jasmine [name]'s husbands death and that Jasmine [name] was still on the waiting list for a two bedroom apartment, as opposed to the one, which Jasmine [name] is now requesting.  Mrs. Mariea Clonts agreed to contact Jasmine [name] directly to discuss the admissions process and initiate placement for Jasmine [name] as soon as possible.  Jasmine [name] voiced understanding and was agreeable to this plan.  Jasmine [name] was extremely appreciative of CSW's involvement with the placement process. CSW will perform a case closure on Jasmine [name], as all goals of treatment have been met from social work standpoint and no additional social work needs have been identified at this time. CSW will notify Jasmine [name]'s RNCM with [facility], Dannielle Huh of CSW's plans to close Jasmine [name]'s case. CSW will fax a correspondence letter to Jasmine [name]'s Primary Care Physician, Dr. [name] to ensure that [name] is aware of CSW's case closure plans. CSW will submit a case closure request to Lurline Del, Care Management Assistant with [facility], in the form of an [employer].  CSW will ensure that Mrs. Laurance Flatten is aware of Rod Can, RNCM with [facility], continued involvement with Jasmine [name]'s care.  [name], BSW, MSW, LCSW  Licensed Education officer, environmental Health System  Mailing [place] [address], [place], [place] [address] Physical Address-[address]  [place], [place], [place] [address] Toll Free Main # [phone] Fax # [phone] Cell # [phone]  Fax # [phone]  Di [email or web address]  Humana  Discrimination is Against the [employer]. and its subsidiaries comply with applicable Federal civil rights laws and do not discriminate on the basis of race, color, national origin, age, disability, or sex. [place] do not exclude people or treat them differently because of race, color, national origin, age, disability, or sex.    Yahoo. and its subsidiaries provide:  . Free auxiliary aids and services, such as qualified sign language interpreters, video remote interpretation, and written information in other formats to people with disabilities when such auxiliary aids and services are necessary to ensure an equal opportunity to participate. . Free language services to people whose primary language is not English when those services are necessary to provide meaningful access, such as translated documents or oral interpretation.    If you need these services, call [phone] or if you use a TTY, call 711.   If you believe that Yahoo. and its subsidiaries have failed to provide these services or discriminated in another way on the basis of race, color, national origin, age, disability, or sex, you can file a Tourist information centre manager with:   Discrimination [place]riev[place] [address]   If you need help filing a grievance, call [phone] or if you use a TTY, call 711.  You can also file a civil rights complaint with the U.S. Department of Health and Financial controller, Office for Civil Rights electronically through the Office for Civil Rights Complaint Portal, available at OnSiteLending.nl.jsf, or by mail or phone at:  U.S. Department of Health and Human Services  [address] [place] [phone], [facility] Building  [place], [place]   [phone], [phone] (TDD)  Complaint forms are available at [email or web address]             [place]: ATTENTION: If you do not speak English, language assistance services, free of charge, are available to you. Call [phone]  (TTY: 354).  Espaol (Spanish): ATENCIN: si habla espaol, tiene a su disposicin servicios gratuitos de asistencia lingstica. Llame al [phone] (TTY: 017).  ???? (Chinese): ?????????????????????????????? [phone] (TTY:711??  Ti?ng Vi?t (Vietnamese): CH : N?u b?n ni Ti?ng Vi?t, c cc d?ch v? h? tr? ngn ng? mi?n ph dnh cho b?n. G?i s? [phone] (TTY: 017).  ??? ([place]): ?? : ???? ????? ?? , ?? ?? ???? ??? ???? ? ???? . [phone] (TTY: 711)??? ??? ???? .  Tagalog (Tagalog - Filipino): PAUNAWA: Kung nagsasalita ka ng Tagalog, maaari kang gumamit ng mga serbisyo ng tulong sa wika nang walang bayad. [place] sa [phone] (TTY: 545).   [place]): :      ,      .  [phone] (: 287).  Kreyl Ayisyen ([place]): ATANSYON: Si w pale Ethelene Hal, gen svis d pou lang ki disponib gratis pou ou. Rele [phone] (TTY: 559).  Fonnie Jarvis Marland KitchenPak[place]): ATTENTION : Si vous parlez franais, des services d'aide linguistique vous sont proposs gratuitement. Appelez le [phone] (ATS : 680).  Polski (Polish): UWAGA: Jeeli mwisz po polsku, moesz skorzysta z bezpatnej pomocy jzykowej. Zadzwo pod numer [phone] (TTY: 370).  Portugus ([place]): ATENO: Se fala portugus, encontram-se disponveis servios lingusticos, grtis. Ligue para [phone] (TTY: 388).   Italiano ([place]): ATTENZIONE: In caso la lingua parlata sia l'italiano, sono disponibili servizi di assistenza linguistica gratuiti. Chiamare il numero [phone]  (TTY: 150).  Dawayne Patricia ([place]): ACHTUNG: Wenn Sie Deutsch sprechen, stehen Ihnen kostenlos sprachliche Hilfsdienstleistungen zur [employer]. Rufnummer: [phone] (TTY: 537).   (Arabic): [phone]   .            : .)492 :   (  ??? ([place]): ??????????????????????????????????[phone] ?TTY?711?????????????????  ? (Farsi): [phone]  . ?   ? ?  ? ? ?~ ?  ?    : .??  (TTY: 711)  Din Bizaad ([place]): D77 baa ak0 n7n7zin: D77 saad bee y1n7[ti'go Risa Grill, saad bee 1k1'1n7da'1wo'd66', t'11 Pricilla Loveless n1 h0l=, koj8' h0d77lnih [phone] (TTY: 088).

## 2016-02-13 NOTE — Patient Outreach (Signed)
[facility]) Care Management   02/13/2016  Jasmine [name] 05-12-1946 [id number]  Jasmine [name] is an 70 y.o. female  Subjective: Patient reports "feeling pretty good physically" but mentally, "still has ups and downs". Patient reports drinking a can of beer to relax her and "feel peaceful", as stated.  Objective: BP 98/60 mmHg  Pulse 71  Resp 16  SpO2 93%  Review of Systems  Constitutional: Negative.   HENT: Negative.  Negative for hearing loss.   Eyes: Negative.        Wears eyeglasses for driving and reading  Respiratory: Positive for shortness of breath. Negative for cough, sputum production and wheezing.        Shortness of breath with physical activity/ exertion Uses home oxygen continuously at 3 liters when at home and 4 liters with activities. Lung sounds diminished to bases, otherwise clear    Cardiovascular: Positive for leg swelling. Negative for chest pain.       Bilateral feet and ankles with persistent 2+ pitting edema  (left greater than right)  Heart with regular rate and rhythm  Gastrointestinal: Negative.  Negative for nausea, vomiting, abdominal pain, diarrhea and constipation.       Abdomen soft, non-tender & non-distended Bowel sounds present  Genitourinary: Negative.        Minimal urine leakage- uses liners for protection  Musculoskeletal: Negative for falls.  Skin: Negative.   Neurological: Negative.   Endo/Heme/Allergies: Negative.   Psychiatric/Behavioral: Positive for depression.       Still grieving over husband's recent passing away - on Citalopram and Xanax engrosses self with activities to distract her per patient    Physical Exam  Encounter Medications:   Outpatient Encounter Prescriptions as of 02/13/2016  Medication Sig Note  . albuterol (PROVENTIL) (2.5 MG/3ML) 0.083% nebulizer solution Take 3 mLs (2.5 mg total) by nebulization every 4 (four) hours as needed for wheezing.   [name] ALPRAZolam (XANAX) 0.25 MG tablet Take 1  tablet (0.25 mg total) by mouth 2 (two) times daily.   . citalopram (CELEXA) 10 MG tablet Take 1 tablet (10 mg total) by mouth daily.   . feeding supplement, ENSURE ENLIVE, (ENSURE ENLIVE) LIQD Take 237 mLs by mouth 3 (three) times daily between meals.   . fluticasone furoate-vilanterol (BREO ELLIPTA) 200-25 MCG/INH AEPB Inhale 1 puff into the lungs daily.   . furosemide (LASIX) 40 MG tablet Take 1 tablet (40 mg total) by mouth daily.   [name] levothyroxine (SYNTHROID, LEVOTHROID) 75 MCG tablet Take 1 tablet (75 mcg total) by mouth daily before breakfast.   . OXYGEN Inhale 4 L into the lungs continuous.  01/30/2016: Uses 4 liters per minuteout of the house  . potassium chloride (K-DUR,KLOR-CON) 10 MEQ tablet Take 10 mEq by mouth 2 (two) times daily.   [name] tiotropium (SPIRIVA HANDIHALER) 18 MCG inhalation capsule Place 1 capsule (18 mcg total) into inhaler and inhale daily.   [name] ceFAZolin (ANCEF) 1-5 GM-% Inject 50 mLs (1 g total) into the vein every 8 (eight) hours. Please provide total of 10day supply. (Patient not taking: Reported on 01/17/2016) 01/17/2016: Completed dose  . guaiFENesin (MUCINEX) 600 MG 12 hr tablet Take 1 tablet (600 mg total) by mouth 2 (two) times daily. (Patient not taking: Reported on 02/13/2016)    No facility-administered encounter medications on file as of 02/13/2016.    Functional Status:   In your present state of health, do you have any difficulty performing the following activities: 02/11/2016 01/09/2016  Hearing?  N Y  Vision? N N  Difficulty concentrating or making decisions? N Y  Walking or climbing stairs? N Y  Dressing or bathing? N N  Doing errands, shopping? N Y  Conservation officer, nature and eating ? N Y  Using the Toilet? N N  In the past six months, have you accidently leaked urine? N N  Do you have problems with loss of bowel control? N N  Managing your Medications? N N  Managing your Finances? N N  Housekeeping or managing your Housekeeping? [name]    Fall/Depression  Screening:    PHQ 2/9 Scores 02/13/2016 02/11/2016 01/09/2016 07/11/2013  PHQ - 2 Score 1 0 1 3  PHQ- 9 Score - - - 4    Assessment:   70 year old female seen for COPD disease and symptom management. Patient agreed to an earlier home visit today when called by care management coordinator to inform of available time earlier than scheduled. Arrived at niece's and nephew's apartment where patient is temporarily residing until able to seek for placement. She mentioned being able to speak to [place] worker and currently assisting patient with placement (at [facility]).  Patient's niece and nephew were in the apartment but stayed mostly in their rooms during visit with patient.   Patient verbalized feeling down today and was glad to have this visit to get her mind off of it. She reports that her niece had bought her crossword puzzles, coloring books, watch a lot of movies on television and goes out shopping to distract her attention and relax her emotions as stated. Encouraged patient to contact [facility] social worker if needing more outside activities to do.  Care management coordinator also encouraged patient to go with her niece when she starts going to the gym for patient to exercise and socialize with others. Patient agreed.  Patient denies any signs and symptoms of COPD at this time. She identified herself on the "green zone" and expressed understanding to call provider when moving to yellow zone  and calling 911 if in the red zone. Patient was able to recall that if she starts experiencing any 3 signs and symptoms of COPD flare-up which lasts more than 2 days then, she has to notify the provider for help. She is proud to report that she continues to be smoke-free. Patient praised for her efforts in avoiding smoking. She verbalized continued use of Breo and Spiriva. Patient reports use of rescue breathing treatment 3 times this week with relief per patient's report. Her sleep and appetite remains  good. Patient mentions continuing to drink nutritional supplement and care management coordinator provided patient discount coupons for Ensure. She was greatly appreciative of it. Patient reports that her weight had been gradually increasing from 86 pounds in the last hospitalization to 100 pounds at present (desirable gain).   Patient reports trying to check food labels for sodium content before purchasing and watching her sodium intake. Patient had been encouraged to elevate legs at intervals and at nights to help decrease swelling. She states that she will try obtain compression stockings that she can wear as well. She verbalized that she will continue to walk around as part of her exercise.    Patient expressed no additional needs at this time. She agreed to routine home visit next month. Patient was encouraged to call [facility] management coordinator or 24 -hour nurse line as necessary. Patient has contact information with her.    Plan: Routine home visit on 03/11/16   [facility] CM  Care Plan Problem One        Most Recent Value   Care Plan Problem One  at risk for redamission   Role Documenting the Problem One  Care Management [place] for Problem One  Active   [facility] Long Term Goal (31-90 days)  patient will not be readmitted in the next 31 days   [facility] Long Term Goal Start Date  01/17/16 [no readmissions noted since last hospitalization]   Interventions for Problem One Long Term Goal  reiterate to patient on medication adherence as prescribed,  instructions reinforced per [place] nurse regarding Levothyroxine 75 mcg (use 1 1/2 tabs of 50 mcg supply that she currently has),  remind to attend scheduled provider appointments,  encourage to read [facility] COPD packet and ask questions if any,  encourage patient to identify how she feels everyday using the COPD zone tool and when to call provider for help,  encourage patient to exercise inorder to build endurance,  monitor for increased  shortness of breath & wheezing- take rescue medication as needed   [facility] CM Short Term Goal #1 (0-30 days)  patient will report attending exercise program/ classes twice a week in the next 30 days   [facility] CM Short Term Goal #1 Start Date  01/17/16 [continues w/ walking exercise around for now- not in program]   Interventions for Short Term Goal #1  discuss with patient the benefits and importance of exercise,  encourage patient with plan to join her niece to [employer] for exercises as tolerated,  encourage patient to start slow to build endurance then progress gradually as tolerated,  encourage to continue walking as part of exercises    [facility] CM Short Term Goal #2 (0-30 days)  patient will report walking about 30 minutes 3 times a week in the next 30 days   [facility] CM Short Term Goal #2 Start Date  01/17/16 Marlana Salvage has started to do walking twice a week]   Interventions for Short Term Goal #2  remind importance of walking as part of her exercise,  encourage to continue with walking as tolerated,  encourage rest periods as needed to prevent shortness of breath and wheezing    [facility]N CM Care Plan Problem Two        Most Recent Value   Care Plan Problem Two  knowledge deficit regarding COPD management   Role Documenting the Problem Two  Care Management Coordinator   Care Plan for Problem Two  Active   Interventions for Problem Two Long Term Goal   reiterate to read [facility] COPD packet and ask questions if any,  review COPD action plan and COPD zone tool,  use teach back method to check patient's understanding,  encourage to use COPD zone tool to identify how she feels,  encourage patient to use COPD action plan to identify signs and symptoms of  COPD flare-up and when to call the doctor for help,  encourage medication adherence especially using inhalers/ nebulizer treatments,  encourage family to bring nebulizing machine from her home to her niece apartment where she is currently staying   [facility] Long Term Goal (31-90)  days  patient will verbalize at least 3 strategies of managing COPD in the next 31 days   [facility]N Long Term Goal Start Date  01/17/16   [facility] CM Short Term Goal #1 (0-30 days)  patient will identify the COPD zones and when to call the doctor for help in the next 30 days  [facility] CM Short Term Goal #1 Start Date  01/17/16   Interventions for Short Term Goal #2   remind patient to read [facility] COPD packet and the different COPD zones,  check patient's understanding using teach back method,  encourage patient to know how she feels everyday using the COPD zone tool,  provide COPD magnet and place on the refrigerator door for easier access and visualization   [facility] CM Short Term Goal #2 (0-30 days)  patient will verbalize the COPD action plan in the next 30 days   [facility] CM Short Term Goal #2 Start Date  01/17/16   Interventions for Short Term Goal #2  emphasize to read [facility] COPD packet and ask questions if any,  reiterate/ review the COPD action plan and when to call provider for help,  ensure that COPD magnet is placed on refrigerator door to visualize and access it easier,  review patient's understanding using teach back method

## 2016-02-13 NOTE — Telephone Encounter (Signed)
Called spoke with pt. She states that the POC is heavier then the oxygen tanks she already uses. I explained to her that due to her oxygen needs it would be hard for a small POC to tolerate that type of oxygen demand. I informed her of the information from the previous phone message from 01/29/16. She states that only certain DME's are within her network. I informed her to call [employer] to discuss the different DMEs. She voiced understanding and had no further questions.

## 2016-02-19 ENCOUNTER — Telehealth: Payer: Self-pay | Admitting: Pulmonary Disease

## 2016-02-19 NOTE — Telephone Encounter (Signed)
ok 

## 2016-02-19 NOTE — Telephone Encounter (Signed)
Spoke with pt, states she would like an albuterol inhaler rx to be sent to [facility].   This is not currently on pt's med list.   RA are you ok with sending in albuterol hfa for pt?  Thanks.

## 2016-02-20 MED ORDER — ALBUTEROL SULFATE HFA 108 (90 BASE) MCG/ACT IN AERS: 2.0000 | INHALATION_SPRAY | Freq: Four times a day (QID) | RESPIRATORY_TRACT | Status: DC | PRN

## 2016-02-20 NOTE — Telephone Encounter (Signed)
Spoke with pt and advised that rx for Albuterol HFA rescue inhaler was sent to pharmacy. Also per phone note 02/13/16 pt was to contact [facility] and ask about other DME companies that could supply a smaller,lighter portable system.  Pt states she spoke with them and they were going to look into it and get back with her.  Pt will update our office when she hears back from them.  Nothing further needed at this time.

## 2016-02-22 ENCOUNTER — Encounter: Payer: Self-pay | Admitting: Internal Medicine

## 2016-02-23 DIAGNOSIS — J449 Chronic obstructive pulmonary disease, unspecified: Secondary | ICD-10-CM | POA: Diagnosis not present

## 2016-02-26 ENCOUNTER — Telehealth: Payer: Self-pay | Admitting: Internal Medicine

## 2016-02-26 NOTE — Telephone Encounter (Signed)
Pt's husband passed back in March and she is wanting to live in an independent living community called the [facility]. They are needing some kind of letter stating that she is able to to take care of herself in a one bedroom apartment alone.  Please fax statement to [phone].  Please call pt to let her know at [phone]

## 2016-02-26 NOTE — Telephone Encounter (Signed)
Please advise can we do this 

## 2016-02-27 ENCOUNTER — Encounter: Payer: Self-pay | Admitting: Internal Medicine

## 2016-02-27 NOTE — Telephone Encounter (Signed)
Letter faxed to pharmacy.

## 2016-02-27 NOTE — Telephone Encounter (Signed)
Letter - Done hardcopy to [name] 

## 2016-02-29 ENCOUNTER — Telehealth: Payer: Self-pay

## 2016-02-29 ENCOUNTER — Telehealth: Payer: Self-pay | Admitting: Pulmonary Disease

## 2016-02-29 DIAGNOSIS — J438 Other emphysema: Secondary | ICD-10-CM

## 2016-02-29 NOTE — Telephone Encounter (Signed)
Letter sent to correct place.

## 2016-02-29 NOTE — Telephone Encounter (Signed)
Spoke with pt. She needs an order sent to [facility] for new nasal cannulas. Order has been placed. Nothing further was needed.

## 2016-02-29 NOTE — Telephone Encounter (Signed)
Patient called back and said that the independent living called [facility] has yet to get the fax. I see it say sent, but you put sent to pharmacy? If you could please send it to [facility] again. Fax number is [phone].

## 2016-03-03 NOTE — Telephone Encounter (Signed)
[facility] called back, they still dont have this, i am faxing to the below number

## 2016-03-11 ENCOUNTER — Other Ambulatory Visit: Payer: Self-pay | Admitting: *Deleted

## 2016-03-11 ENCOUNTER — Encounter: Payer: Self-pay | Admitting: *Deleted

## 2016-03-11 VITALS — BP 100/58 | HR 74 | Resp 16

## 2016-03-11 DIAGNOSIS — J441 Chronic obstructive pulmonary disease with (acute) exacerbation: Secondary | ICD-10-CM

## 2016-03-11 NOTE — Patient Outreach (Signed)
[name] [place]) Care Management   03/11/2016  Jasmine [name] 1945-12-23 [id number]  Jasmine [name] is an 70 y.o. female  Subjective: Patient reports that "some days are brighter". She states "not having been sick or down" and "stays pretty much Jasmine same". Jasmine [name] reports that she noticed having increased cough and mucus production about 2 days ago but did not last for a day. She verbalized that use of rescue inhaler (Ventolin) provided her relief. Patient mentions that she is coping up and adjusting better with her husband's loss and stays "in pretty good shape".  Objective:  BP 100/58 mmHg  Pulse 74  Resp 16  SpO2 93%  Review of Systems  Constitutional: Negative.   HENT: Positive for hearing loss.        Slight hearing loss- not needing hearing aid but ear cleaning per patient  Eyes: Negative.        Wears eyeglasses for reading and driving  Respiratory: Positive for shortness of breath. Negative for cough, sputum production and wheezing.        Lung sounds diminished to bases otherwise clear Respirations even and unlabored Shortness of breath with activity or climbing stairs Home oxygen at 3 liters per minute at home/ rest, 4 liters with activities  Cardiovascular: Positive for leg swelling. Negative for chest pain.       Persistent lower extremity edema left greater than right - on Lasix Regular rate and rhythm  Gastrointestinal: Negative for nausea, vomiting, abdominal pain, diarrhea and constipation.       Abdomen soft and non-tender, non-distended Bowel sounds positive  Genitourinary: Positive for frequency.       Minimal urine leakage - wears liners for protection  Musculoskeletal: Negative for joint pain and falls.  Skin: Negative.   Neurological: Negative.   Endo/Heme/Allergies: Negative.   Psychiatric/Behavioral: Positive for depression.       Episodes of loneliness related to husband's passing away recently - on Alprazolam, Celexa    Physical  Exam  Encounter Medications:   Outpatient Encounter Prescriptions as of 03/11/2016  Medication Sig Note  . albuterol (PROVENTIL HFA;VENTOLIN HFA) 108 (90 Base) MCG/ACT inhaler Inhale 2 puffs into Jasmine lungs every 6 (six) hours as needed for wheezing or shortness of breath.   [name] albuterol (PROVENTIL) (2.5 MG/3ML) 0.083% nebulizer solution Take 3 mLs (2.5 mg total) by nebulization every 4 (four) hours as needed for wheezing.   [name] ALPRAZolam (XANAX) 0.25 MG tablet Take 1 tablet (0.25 mg total) by mouth 2 (two) times daily.   . citalopram (CELEXA) 10 MG tablet Take 1 tablet (10 mg total) by mouth daily.   . feeding supplement, ENSURE ENLIVE, (ENSURE ENLIVE) LIQD Take 237 mLs by mouth 3 (three) times daily between meals.   . fluticasone furoate-vilanterol (BREO ELLIPTA) 200-25 MCG/INH AEPB Inhale 1 puff into Jasmine lungs daily.   . furosemide (LASIX) 40 MG tablet Take 1 tablet (40 mg total) by mouth daily.   [name] levothyroxine (SYNTHROID, LEVOTHROID) 75 MCG tablet Take 1 tablet (75 mcg total) by mouth daily before breakfast.   . OXYGEN Inhale 4 L into Jasmine lungs continuous.  01/30/2016: Uses 4 liters per minuteout of Jasmine house  . potassium chloride (K-DUR,KLOR-CON) 10 MEQ tablet Take 10 mEq by mouth 2 (two) times daily.   [name] tiotropium (SPIRIVA HANDIHALER) 18 MCG inhalation capsule Place 1 capsule (18 mcg total) into inhaler and inhale daily.   [name] ceFAZolin (ANCEF) 1-5 GM-% Inject 50 mLs (1 g total) into Jasmine  vein every 8 (eight) hours. Please provide total of 10day supply. (Patient not taking: Reported on 01/17/2016) 01/17/2016: Completed dose  . guaiFENesin (MUCINEX) 600 MG 12 hr tablet Take 1 tablet (600 mg total) by mouth 2 (two) times daily. (Patient not taking: Reported on 02/13/2016)    No facility-administered encounter medications on file as of 03/11/2016.    Functional Status:   In your present state of health, do you have any difficulty performing Jasmine following activities: 03/11/2016 02/13/2016  Hearing? [place]? N -  Difficulty concentrating or making decisions? Y -  Walking or climbing stairs? Y -  Dressing or bathing? N -  Doing errands, shopping? N -  Preparing Food and eating ? (No Data) -  Using Jasmine Toilet? N -  In Jasmine past six months, have you accidently leaked urine? Y Y  Do you have problems with loss of bowel control? N -  Managing your Medications? N Y  Managing your Finances? N -  Housekeeping or managing your Housekeeping? Y -    Fall/Depression Screening:    PHQ 2/9 Scores 03/11/2016 02/13/2016 02/11/2016 01/09/2016 07/11/2013  PHQ - 2 Score 1 1 0 1 3  PHQ- 9 Score - - - - 4    Assessment:   70 year old female seen for COPD. Arrived at patient's temporary home (niece's apartment) and she is by herself. Patient shares that her niece had surgery [name] and nephew is there to assist with her needs. Nephew called to check on patient during Jasmine visit. Patient's appointment with realtor and buyer of her house was moved back by her since she scheduled it too close to Jasmine visit.  Patient mentions that she had applied at [name] and had paid for deposit and 1st month rent. She is awaiting for her move in date as stated. She reports that housekeeping will be provided for. She will have a private pay caregiver who will be assisting her for maximum of 5 days a week. She states that she will be bringing her car and driving herself to providers' appointments, groceries and errands. She continues to manage her own medications using "pill box" and takes them as prescribed.  During this visit, pulse oximetry had a hard time reading levels since her fingers/ hands were cold after coming out from Jasmine shower.  Thermostat was set on higher temperature. Patient was covered with blanket and hands/fingers were kept warm. Oxygen level came up to 93 - 94 % with heart rate at 74.  Patient reports being on Jasmine "green zone" and denies any reportable  signs and symptoms of COPD at present.  She verbalized understanding to notify provider if going into Jasmine yellow zone and call 911 if in Jasmine red zone. She continues with Breo and Spiriva as maintenance inhalers and uses Ventolin inhaler as rescue medication for wheezing or shortness of breath. She is aware to do purse-lip breathing at intervals, take rest periods and paces herself when climbing Jasmine stairs to catch up with breathing.  Encouraged patient during this visit to call pulmonologist's office to follow-up on portable oxygen concentrator and was told they will call her back.  Follow-up was done with [facility] on update with Jasmine process of getting a smaller, lighter portable concentrator and patient was given a contact information of medical supply [name] to obtain one. Patient was told that [facility] will cover for it. She states she will call [employer] in Jasmine morning.  Patient also called primary care provider's office today for a referral to ENT doctor for earwax build-up and was told they will give her a call back.   Patient had expressed need to be assisted with Advanced Directive: Living will completion. Referral to [place] worker made.  She denies any additional needs or issues at this time. Patient agreed to routine home visit next month. She was encouraged to call [name] management coordinator or 24 -hour nurse line if needed. She has Jasmine contact information with her.    Plan: Routine home visit on 04/10/16 [facility] social worker referral for assistance in completing/ reviewing Living Will documents.    [facility] CM Care Plan Problem One        Most Recent Value   Care Plan Problem One  at risk for redamission   Role Documenting Jasmine Problem One  Care Management [place] for Problem One  Not Active   THN Long Term Goal (31-90 days)  patient will not be readmitted in Jasmine next 31 days   THN Long Term Goal Start Date  01/17/16 [no readmissions noted since last  hospitalization]   THN Long Term Goal Met Date  03/11/16 [Goal met]   THN CM Short Term Goal #1 (0-30 days)  patient will report attending exercise program/ classes twice a week in Jasmine next 30 days   THN CM Short Term Goal #1 Start Date  01/17/16 [continues w/ walking exercise around for now- not in program]   [name] CM Short Term Goal #1 Met Date  03/11/16 [Not met-not on exercise program but cont.to walk 2-3x/week ]   THN CM Short Term Goal #2 (0-30 days)  patient will report walking about 30 minutes 3 times a week in Jasmine next 30 days   THN CM Short Term Goal #2 Start Date  01/17/16 Marlana Salvage has started to do walking twice a week]   THN CM Short Term Goal #2 Met Date  03/11/16 [Goal met- reports walking 2-3 times a week]    [name] CM Care Plan Problem Two        Most Recent Value   Care Plan Problem Two  knowledge deficit regarding COPD management   Role Documenting Jasmine Problem Two  Care Management Coordinator   Care Plan for Problem Two  Active   THN Long Term Goal (31-90) days  patient will verbalize at least 3 strategies of managing COPD in Jasmine next 31 days   THN Long Term Goal Start Date  01/17/16   [facility] Long Term Goal Met Date  03/11/16 [Goal met- able to verbalize 5 ways in managing COPD]   THN CM Short Term Goal #1 (0-30 days)  patient will identify Jasmine COPD zones and when to call Jasmine doctor for help in Jasmine next 30 days   THN CM Short Term Goal #1 Start Date  01/17/16   [facility] CM Short Term Goal #1 Met Date   03/11/16 [Goal met.]   THN CM Short Term Goal #2 (0-30 days)  patient will verbalize Jasmine COPD action plan in Jasmine next 30 days   THN CM Short Term Goal #2 Start Date  01/17/16   [name] CM Short Term Goal #2 Met Date  03/11/16 [Goal met]   THN CM Short Term Goal #3 (0-30 days)  patient will verbalize at least 3 signs and symptoms of COPD flare-up and know when to call Jasmine doctor in Jasmine next 30 days   THN CM Short Term Goal #  3 Start Date  03/11/16   Interventions for Short Term Goal #3  review THN  COPD packet provided and discuss about Jasmine different signs and symptoms of COPD exacerbation,  use teach back method to evaluate understanding,  encourage to call/ notify provider when experiencing any,  ensure that [facility] COPD magnet is on Jasmine refrigerator door for easier visualization and access   THN CM Short Term Goal #4 (0-30 days)  patient will report obtaining smaller, lighter portable oxygen concentrator to use outside Jasmine house in Jasmine next 30 days   THN CM Short Term Goal #4 Start Date  03/11/16   Interventions of Short Term Goal #4  assist patient in calling to follow-up with pulmonologist's office regarding portable oxygen concentrator needed ([employer] brought in one but sent right back since it is bigger and heavier than Jasmine small 02 tanks),  encouraged & assisted patient to call for update and follow-up with [employer] regarding portable oxygen concentrator - follow recommendations/ instructions     Earle Burson A. Katha Kuehne, BSN, RN-BC [place] Management Coordinator Cell: [phone]

## 2016-03-11 NOTE — Patient Outreach (Signed)
[facility] [facility]) Care Management  03/11/2016  Jasmine [name] May 14, 1946 [id number]  Assessment: Care coordination Received an in-basket message from care management assistant Martha Clan) that patient called the office requesting care management coordinator to call prior to scheduled home visit on 5/23 to confirm that she is still in her current address. Call placed to speak with patient but unable to reach her. HIPPA compliant voice message left with name and contact number.  Call placed to patient using another phone number and was able to speak to her. Patient confirmed that she is still at her current address and that it is okay to proceed with the scheduled home visit tomorrow. Patient reports that her move is in the process. She will be moving in to [facility] soon but no specific date as of yet. Patient also mentioned that her niece [name]) had her surgery today.   Plan: Will proceed with routine home visit tomorrow 5/23 as scheduled.  Mabrey Howland A. Sakeena Teall, BSN, RN-BC [place] Management Coordinator Cell: [phone]

## 2016-03-13 ENCOUNTER — Ambulatory Visit: Payer: Commercial Managed Care - HMO

## 2016-03-14 ENCOUNTER — Telehealth: Payer: Self-pay | Admitting: Internal Medicine

## 2016-03-14 NOTE — Telephone Encounter (Signed)
Patient states she is moving to [facility].  She states [name] is requesting an order to be sent over to allow them to administer a TB test.  You can reach Karleen Hampshire at [phone].  Order can be faxed to [phone].  Patient states she is also needed an FL2 faxed over to [facility].  Patient would like this done today if possible.  Patient states [name] had recently completed an FL2 to go to a different facility.  Patient states the other facility was moving too slow on getting her moved.

## 2016-03-18 NOTE — Telephone Encounter (Signed)
Karleen Hampshire with [name] is calling back in regards.  States faxed FL2 over on Friday.  Would like to know if this was received.  States patient is waiting to move in.  Clair Gulling can be reached at [phone].

## 2016-03-18 NOTE — Telephone Encounter (Signed)
Paperwork signed and faxed back to [place]. Clair Gulling advised of same

## 2016-03-19 ENCOUNTER — Telehealth: Payer: Self-pay

## 2016-03-19 NOTE — Telephone Encounter (Signed)
Done hardcopy to [name]  

## 2016-03-19 NOTE — Telephone Encounter (Signed)
The home that the patient is going into needs two meds dcs off her med list. That is on her FL2. It is an Ancef injections and the mucinex. You can just send a dc form with the meds on it. Fax:[phone]

## 2016-03-19 NOTE — Telephone Encounter (Signed)
Please advise 

## 2016-03-19 NOTE — Telephone Encounter (Signed)
Prescription faxed

## 2016-03-20 ENCOUNTER — Telehealth: Payer: Self-pay | Admitting: *Deleted

## 2016-03-20 MED ORDER — ALPRAZOLAM 0.25 MG PO TABS: 0.2500 mg | ORAL_TABLET | Freq: Two times a day (BID) | ORAL | Status: DC

## 2016-03-20 NOTE — Telephone Encounter (Signed)
Left msg on triage stating pt has moved in the facility need to pick-up written script for Alprazolam.../lmb

## 2016-03-20 NOTE — Telephone Encounter (Signed)
Done hardcopy to [name]  

## 2016-03-21 NOTE — Telephone Encounter (Signed)
Called [facility] back spoke w/shirley inform her rx ready for pick-up...[name]

## 2016-03-24 ENCOUNTER — Inpatient Hospital Stay (HOSPITAL_COMMUNITY)
Admission: EM | Admit: 2016-03-24 | Discharge: 2016-03-27 | DRG: 189 | Payer: Commercial Managed Care - HMO | Attending: Family Medicine | Admitting: Family Medicine

## 2016-03-24 ENCOUNTER — Encounter (HOSPITAL_COMMUNITY): Payer: Self-pay

## 2016-03-24 ENCOUNTER — Emergency Department (HOSPITAL_COMMUNITY): Payer: Commercial Managed Care - HMO

## 2016-03-24 ENCOUNTER — Observation Stay (HOSPITAL_COMMUNITY): Payer: Commercial Managed Care - HMO

## 2016-03-24 DIAGNOSIS — R079 Chest pain, unspecified: Secondary | ICD-10-CM | POA: Diagnosis not present

## 2016-03-24 DIAGNOSIS — J9621 Acute and chronic respiratory failure with hypoxia: Principal | ICD-10-CM | POA: Diagnosis present

## 2016-03-24 DIAGNOSIS — R918 Other nonspecific abnormal finding of lung field: Secondary | ICD-10-CM

## 2016-03-24 DIAGNOSIS — I10 Essential (primary) hypertension: Secondary | ICD-10-CM

## 2016-03-24 DIAGNOSIS — Z811 Family history of alcohol abuse and dependence: Secondary | ICD-10-CM | POA: Diagnosis not present

## 2016-03-24 DIAGNOSIS — Z87891 Personal history of nicotine dependence: Secondary | ICD-10-CM | POA: Diagnosis not present

## 2016-03-24 DIAGNOSIS — J329 Chronic sinusitis, unspecified: Secondary | ICD-10-CM | POA: Diagnosis present

## 2016-03-24 DIAGNOSIS — Z823 Family history of stroke: Secondary | ICD-10-CM | POA: Diagnosis not present

## 2016-03-24 DIAGNOSIS — Z8249 Family history of ischemic heart disease and other diseases of the circulatory system: Secondary | ICD-10-CM

## 2016-03-24 DIAGNOSIS — I110 Hypertensive heart disease with heart failure: Secondary | ICD-10-CM | POA: Diagnosis present

## 2016-03-24 DIAGNOSIS — R0602 Shortness of breath: Secondary | ICD-10-CM | POA: Diagnosis present

## 2016-03-24 DIAGNOSIS — Z9981 Dependence on supplemental oxygen: Secondary | ICD-10-CM | POA: Diagnosis not present

## 2016-03-24 DIAGNOSIS — I509 Heart failure, unspecified: Secondary | ICD-10-CM | POA: Diagnosis not present

## 2016-03-24 DIAGNOSIS — Z825 Family history of asthma and other chronic lower respiratory diseases: Secondary | ICD-10-CM

## 2016-03-24 DIAGNOSIS — E0500 Thyrotoxicosis with diffuse goiter without thyrotoxic crisis or storm: Secondary | ICD-10-CM | POA: Diagnosis present

## 2016-03-24 DIAGNOSIS — J449 Chronic obstructive pulmonary disease, unspecified: Secondary | ICD-10-CM | POA: Diagnosis present

## 2016-03-24 DIAGNOSIS — R7303 Prediabetes: Secondary | ICD-10-CM | POA: Diagnosis present

## 2016-03-24 DIAGNOSIS — R945 Abnormal results of liver function studies: Secondary | ICD-10-CM

## 2016-03-24 DIAGNOSIS — E039 Hypothyroidism, unspecified: Secondary | ICD-10-CM | POA: Diagnosis present

## 2016-03-24 DIAGNOSIS — J948 Other specified pleural conditions: Secondary | ICD-10-CM | POA: Diagnosis not present

## 2016-03-24 DIAGNOSIS — R063 Periodic breathing: Secondary | ICD-10-CM | POA: Diagnosis not present

## 2016-03-24 DIAGNOSIS — Z801 Family history of malignant neoplasm of trachea, bronchus and lung: Secondary | ICD-10-CM | POA: Diagnosis not present

## 2016-03-24 DIAGNOSIS — M810 Age-related osteoporosis without current pathological fracture: Secondary | ICD-10-CM | POA: Diagnosis present

## 2016-03-24 DIAGNOSIS — R7989 Other specified abnormal findings of blood chemistry: Secondary | ICD-10-CM | POA: Diagnosis not present

## 2016-03-24 DIAGNOSIS — R739 Hyperglycemia, unspecified: Secondary | ICD-10-CM | POA: Diagnosis present

## 2016-03-24 DIAGNOSIS — M5416 Radiculopathy, lumbar region: Secondary | ICD-10-CM | POA: Diagnosis present

## 2016-03-24 DIAGNOSIS — Z888 Allergy status to other drugs, medicaments and biological substances status: Secondary | ICD-10-CM

## 2016-03-24 DIAGNOSIS — F418 Other specified anxiety disorders: Secondary | ICD-10-CM | POA: Diagnosis present

## 2016-03-24 DIAGNOSIS — J438 Other emphysema: Secondary | ICD-10-CM

## 2016-03-24 DIAGNOSIS — J810 Acute pulmonary edema: Secondary | ICD-10-CM

## 2016-03-24 DIAGNOSIS — J9811 Atelectasis: Secondary | ICD-10-CM | POA: Diagnosis present

## 2016-03-24 DIAGNOSIS — Z833 Family history of diabetes mellitus: Secondary | ICD-10-CM

## 2016-03-24 DIAGNOSIS — J9600 Acute respiratory failure, unspecified whether with hypoxia or hypercapnia: Secondary | ICD-10-CM | POA: Diagnosis not present

## 2016-03-24 DIAGNOSIS — Z7951 Long term (current) use of inhaled steroids: Secondary | ICD-10-CM

## 2016-03-24 DIAGNOSIS — I248 Other forms of acute ischemic heart disease: Secondary | ICD-10-CM | POA: Diagnosis present

## 2016-03-24 DIAGNOSIS — J90 Pleural effusion, not elsewhere classified: Secondary | ICD-10-CM | POA: Insufficient documentation

## 2016-03-24 DIAGNOSIS — I5033 Acute on chronic diastolic (congestive) heart failure: Secondary | ICD-10-CM | POA: Diagnosis present

## 2016-03-24 LAB — COMPREHENSIVE METABOLIC PANEL
ALBUMIN: 3.2 g/dL — AB (ref 3.5–5.0)
ALK PHOS: 84 U/L (ref 38–126)
ALT: 216 U/L — ABNORMAL HIGH (ref 14–54)
ANION GAP: 11 (ref 5–15)
AST: 204 U/L — ABNORMAL HIGH (ref 15–41)
BILIRUBIN TOTAL: 1 mg/dL (ref 0.3–1.2)
BUN: 32 mg/dL — AB (ref 6–20)
CALCIUM: 9.3 mg/dL (ref 8.9–10.3)
CO2: 37 mmol/L — ABNORMAL HIGH (ref 22–32)
Chloride: 89 mmol/L — ABNORMAL LOW (ref 101–111)
Creatinine, Ser: 1.18 mg/dL — ABNORMAL HIGH (ref 0.44–1.00)
GFR calc Af Amer: 52 mL/min — ABNORMAL LOW (ref >60)
GFR calc non Af Amer: 45 mL/min — ABNORMAL LOW (ref >60)
GLUCOSE: 197 mg/dL — AB (ref 65–99)
Potassium: 5.5 mmol/L — ABNORMAL HIGH (ref 3.5–5.1)
Sodium: 137 mmol/L (ref 135–145)
TOTAL PROTEIN: 6.4 g/dL — AB (ref 6.5–8.1)

## 2016-03-24 LAB — CBC WITH DIFFERENTIAL/PLATELET
BASOS ABS: 0 10*3/uL (ref 0.0–0.1)
BASOS PCT: 0 %
Eosinophils Absolute: 0 10*3/uL (ref 0.0–0.7)
Eosinophils Relative: 0 %
HEMATOCRIT: 42.3 % (ref 36.0–46.0)
HEMOGLOBIN: 12.9 g/dL (ref 12.0–15.0)
LYMPHS PCT: 12 %
Lymphs Abs: 1.1 10*3/uL (ref 0.7–4.0)
MCH: 29.3 pg (ref 26.0–34.0)
MCHC: 30.5 g/dL (ref 30.0–36.0)
MCV: 95.9 fL (ref 78.0–100.0)
MONO ABS: 1.4 10*3/uL — AB (ref 0.1–1.0)
Monocytes Relative: 15 %
NEUTROS ABS: 6.5 10*3/uL (ref 1.7–7.7)
NEUTROS PCT: 73 %
Platelets: 235 10*3/uL (ref 150–400)
RBC: 4.41 MIL/uL (ref 3.87–5.11)
RDW: 15.5 % (ref 11.5–15.5)
WBC: 8.9 10*3/uL (ref 4.0–10.5)

## 2016-03-24 LAB — GLUCOSE, CAPILLARY
Glucose-Capillary: 139 mg/dL — ABNORMAL HIGH (ref 65–99)
Glucose-Capillary: 164 mg/dL — ABNORMAL HIGH (ref 65–99)

## 2016-03-24 LAB — I-STAT TROPONIN, ED: Troponin i, poc: 0.09 ng/mL (ref 0.00–0.08)

## 2016-03-24 LAB — BRAIN NATRIURETIC PEPTIDE: B Natriuretic Peptide: 473 pg/mL — ABNORMAL HIGH (ref 0.0–100.0)

## 2016-03-24 LAB — ETHANOL

## 2016-03-24 LAB — TROPONIN I
TROPONIN I: 0.08 ng/mL — AB (ref <0.031)
Troponin I: 0.09 ng/mL — ABNORMAL HIGH (ref <0.031)

## 2016-03-24 MED ORDER — ALBUTEROL SULFATE HFA 108 (90 BASE) MCG/ACT IN AERS: 2.0000 | INHALATION_SPRAY | Freq: Four times a day (QID) | RESPIRATORY_TRACT | Status: DC | PRN

## 2016-03-24 MED ORDER — SODIUM CHLORIDE 0.9% FLUSH: 3.0000 mL | INTRAVENOUS | Status: DC | PRN

## 2016-03-24 MED ORDER — ALBUTEROL SULFATE (2.5 MG/3ML) 0.083% IN NEBU: 2.5000 mg | INHALATION_SOLUTION | RESPIRATORY_TRACT | Status: DC | PRN

## 2016-03-24 MED ORDER — VITAMIN B-1 100 MG PO TABS
100.0000 mg | ORAL_TABLET | Freq: Every day | ORAL | Status: DC
  Administered 2016-03-24: 100 mg via ORAL

## 2016-03-24 MED ORDER — POTASSIUM CHLORIDE CRYS ER 10 MEQ PO TBCR: 10.0000 meq | EXTENDED_RELEASE_TABLET | Freq: Two times a day (BID) | ORAL | Status: DC

## 2016-03-24 MED ORDER — BARIUM SULFATE 2.1 % PO SUSP
450.0000 mL | Freq: Once | ORAL | Status: AC
  Administered 2016-03-24: 450 mL via ORAL

## 2016-03-24 MED ORDER — FUROSEMIDE 10 MG/ML IJ SOLN: 20.0000 mg | Freq: Two times a day (BID) | INTRAMUSCULAR | Status: DC

## 2016-03-24 MED ORDER — LORAZEPAM 2 MG/ML IJ SOLN: 1.0000 mg | Freq: Four times a day (QID) | INTRAMUSCULAR | Status: AC | PRN

## 2016-03-24 MED ORDER — CITALOPRAM HYDROBROMIDE 10 MG PO TABS
10.0000 mg | ORAL_TABLET | Freq: Every day | ORAL | Status: DC
  Administered 2016-03-24 – 2016-03-27 (×4): 10 mg via ORAL
  Filled 2016-03-24 (×4): qty 1

## 2016-03-24 MED ORDER — LORAZEPAM 1 MG PO TABS: 1.0000 mg | ORAL_TABLET | Freq: Four times a day (QID) | ORAL | Status: AC | PRN

## 2016-03-24 MED ORDER — FOLIC ACID 1 MG PO TABS
1.0000 mg | ORAL_TABLET | Freq: Every day | ORAL | Status: DC
  Administered 2016-03-24: 1 mg via ORAL
  Filled 2016-03-24 (×4): qty 1

## 2016-03-24 MED ORDER — ASPIRIN EC 81 MG PO TBEC
81.0000 mg | DELAYED_RELEASE_TABLET | Freq: Every day | ORAL | Status: DC
  Administered 2016-03-24 – 2016-03-27 (×4): 81 mg via ORAL
  Filled 2016-03-24 (×4): qty 1

## 2016-03-24 MED ORDER — VITAMIN B-1 100 MG PO TABS
100.0000 mg | ORAL_TABLET | Freq: Every day | ORAL | Status: DC
  Administered 2016-03-24 – 2016-03-27 (×4): 100 mg via ORAL
  Filled 2016-03-24 (×5): qty 1

## 2016-03-24 MED ORDER — THIAMINE HCL 100 MG/ML IJ SOLN: 100.0000 mg | Freq: Every day | INTRAMUSCULAR | Status: DC

## 2016-03-24 MED ORDER — FUROSEMIDE 10 MG/ML IJ SOLN
40.0000 mg | Freq: Two times a day (BID) | INTRAMUSCULAR | Status: DC
  Administered 2016-03-24 – 2016-03-27 (×6): 40 mg via INTRAVENOUS
  Filled 2016-03-24 (×7): qty 4

## 2016-03-24 MED ORDER — FLUTICASONE FUROATE-VILANTEROL 200-25 MCG/INH IN AEPB
1.0000 | INHALATION_SPRAY | Freq: Every day | RESPIRATORY_TRACT | Status: DC
  Filled 2016-03-24 (×2): qty 28

## 2016-03-24 MED ORDER — FUROSEMIDE 10 MG/ML IJ SOLN
80.0000 mg | Freq: Once | INTRAMUSCULAR | Status: AC
  Administered 2016-03-24: 80 mg via INTRAVENOUS
  Filled 2016-03-24: qty 8

## 2016-03-24 MED ORDER — ALPRAZOLAM 0.25 MG PO TABS: 0.2500 mg | ORAL_TABLET | Freq: Two times a day (BID) | ORAL | Status: DC | PRN

## 2016-03-24 MED ORDER — ONDANSETRON HCL 4 MG/2ML IJ SOLN: 4.0000 mg | Freq: Four times a day (QID) | INTRAMUSCULAR | Status: DC | PRN

## 2016-03-24 MED ORDER — LEVOTHYROXINE SODIUM 75 MCG PO TABS
75.0000 ug | ORAL_TABLET | Freq: Every day | ORAL | Status: DC
  Administered 2016-03-25 – 2016-03-27 (×2): 75 ug via ORAL
  Filled 2016-03-24 (×3): qty 1

## 2016-03-24 MED ORDER — SODIUM CHLORIDE 0.9 % IV SOLN: 250.0000 mL | INTRAVENOUS | Status: DC | PRN

## 2016-03-24 MED ORDER — HEPARIN SODIUM (PORCINE) 5000 UNIT/ML IJ SOLN
5000.0000 [IU] | Freq: Three times a day (TID) | INTRAMUSCULAR | Status: DC
  Administered 2016-03-24 – 2016-03-25 (×3): 5000 [IU] via SUBCUTANEOUS
  Filled 2016-03-24 (×3): qty 1

## 2016-03-24 MED ORDER — BARIUM SULFATE 2.1 % PO SUSP
ORAL | Status: AC
  Administered 2016-03-24: 1 mL
  Filled 2016-03-24: qty 2

## 2016-03-24 MED ORDER — FOLIC ACID 1 MG PO TABS
1.0000 mg | ORAL_TABLET | Freq: Every day | ORAL | Status: DC
  Administered 2016-03-24 – 2016-03-27 (×4): 1 mg via ORAL
  Filled 2016-03-24 (×2): qty 1

## 2016-03-24 MED ORDER — IOPAMIDOL (ISOVUE-370) INJECTION 76%
INTRAVENOUS | Status: AC
  Administered 2016-03-24: 100 mL
  Filled 2016-03-24: qty 100

## 2016-03-24 MED ORDER — SODIUM CHLORIDE 0.9% FLUSH
3.0000 mL | Freq: Two times a day (BID) | INTRAVENOUS | Status: DC
  Administered 2016-03-24 – 2016-03-27 (×6): 3 mL via INTRAVENOUS

## 2016-03-24 MED ORDER — ADULT MULTIVITAMIN W/MINERALS CH
1.0000 | ORAL_TABLET | Freq: Every day | ORAL | Status: DC
  Administered 2016-03-24 – 2016-03-27 (×4): 1 via ORAL
  Filled 2016-03-24 (×2): qty 1

## 2016-03-24 MED ORDER — INSULIN ASPART 100 UNIT/ML ~~LOC~~ SOLN
0.0000 [IU] | Freq: Three times a day (TID) | SUBCUTANEOUS | Status: DC
  Administered 2016-03-25 – 2016-03-26 (×2): 1 [IU] via SUBCUTANEOUS
  Administered 2016-03-27 (×2): 2 [IU] via SUBCUTANEOUS

## 2016-03-24 MED ORDER — ALPRAZOLAM 0.25 MG PO TABS
0.2500 mg | ORAL_TABLET | Freq: Two times a day (BID) | ORAL | Status: DC
  Administered 2016-03-24 – 2016-03-27 (×7): 0.25 mg via ORAL
  Filled 2016-03-24 (×7): qty 1

## 2016-03-24 MED ORDER — ASPIRIN 81 MG PO CHEW: 324.0000 mg | CHEWABLE_TABLET | Freq: Once | ORAL | Status: DC

## 2016-03-24 MED ORDER — ADULT MULTIVITAMIN W/MINERALS CH
1.0000 | ORAL_TABLET | Freq: Every day | ORAL | Status: DC
  Administered 2016-03-24: 1 via ORAL
  Filled 2016-03-24 (×4): qty 1

## 2016-03-24 MED ORDER — TIOTROPIUM BROMIDE MONOHYDRATE 18 MCG IN CAPS
18.0000 ug | ORAL_CAPSULE | Freq: Every day | RESPIRATORY_TRACT | Status: DC
  Administered 2016-03-25 – 2016-03-27 (×3): 18 ug via RESPIRATORY_TRACT
  Filled 2016-03-24: qty 5

## 2016-03-24 MED ORDER — ACETAMINOPHEN 325 MG PO TABS
650.0000 mg | ORAL_TABLET | ORAL | Status: DC | PRN
  Administered 2016-03-26: 650 mg via ORAL
  Filled 2016-03-24: qty 2

## 2016-03-24 NOTE — ED Provider Notes (Signed)
CSN: [id number]     Arrival date & time 03/24/16  [id number] History   First MD Initiated Contact with Patient 03/24/16 [phone]     Chief Complaint  Patient presents with  . Shortness of Breath     (Consider location/radiation/quality/duration/timing/severity/associated sxs/prior Treatment) Patient is a 70 y.o. female presenting with shortness of breath. The history is provided by the patient.  Shortness of Breath Severity:  Moderate Onset quality:  Gradual Duration:  2 weeks Progression:  Worsening Chronicity:  New Context: not URI   Relieved by:  Nothing Worsened by:  Nothing tried Ineffective treatments:  Diuretics Associated symptoms: chest pain (today with breathing)   Associated symptoms: no fever, no sputum production and no syncope   Risk factors: hx of PE/DVT     Past Medical History  Diagnosis Date  . ALLERGIC RHINITIS 05/01/2007  . DEPRESSION 07/21/2008  . FATIGUE 10/10/2010  . HYPERTENSION 05/01/2007  . LUMBAR RADICULOPATHY, RIGHT 08/25/2008  . OSTEOPOROSIS 05/01/2007  . SHOULDER PAIN, LEFT 08/23/2009  . SINUSITIS, CHRONIC 10/10/2010  . SINUSITIS- ACUTE-NOS 10/10/2008  . DVT (deep venous thrombosis) (HCC) 1960s    LLE  . CHF (congestive heart failure) ([place])   . HYPERTHYROIDISM 11/23/2007    Pt endorses having had Graves disease, possibly radioactive iodine x1, but no thyroidectomy  . HYPOTHYROIDISM 08/23/2009  . ASTHMA 05/01/2007    "since I was a child"  . Chronic bronchitis ([place])   . Arthritis     "hands" (12/28/2015)  . Anxiety   . On home oxygen therapy     "3-4L; qd; all the time" (12/28/2015)   Past Surgical History  Procedure Laterality Date  . Appendectomy    . Ectopic pregnancy surgery  "late '60s or early '70s  . Oophorectomy Right   . Electrocardiogram  06/20/2006  . Dilation and curettage of uterus  multiple    history of multiple dialations and curettages and miscarriages, unfortunately never carrying a child to term   Family History  Problem Relation  Age of Onset  . Alcohol abuse Brother   . Diabetes Brother   . Hypertension Other   . Lung cancer Father   . Stroke Mother   . Asthma      maternal aunts  . Thyroid disease Mother    Social History  Substance Use Topics  . Smoking status: Former Smoker -- 1.00 packs/day for 54 years    Types: Cigarettes    Quit date: 07/21/2015  . Smokeless tobacco: Never Used  . Alcohol Use: Yes     Comment: 12/28/2015 "might drink a beer/month"   OB History    Gravida Para Term Preterm AB TAB SAB Ectopic Multiple Living   5    5  4 1        Review of Systems  Constitutional: Negative for fever.  Respiratory: Positive for shortness of breath. Negative for sputum production.   Cardiovascular: Positive for chest pain (today with breathing). Negative for syncope.  All other systems reviewed and are negative.     Allergies  Alendronate sodium  Home Medications   Prior to Admission medications   Medication Sig Start Date End Date Taking? Authorizing Provider  albuterol (PROVENTIL HFA;VENTOLIN HFA) 108 (90 Base) MCG/ACT inhaler Inhale 2 puffs into the lungs every 6 (six) hours as needed for wheezing or shortness of breath. 02/20/16   Rigoberto Noel, MD  albuterol (PROVENTIL) (2.5 MG/3ML) 0.083% nebulizer solution Take 3 mLs (2.5 mg total) by nebulization every 4 (four) hours as needed  for wheezing. 06/25/14   Theodis Blaze, MD  ALPRAZolam Duanne Moron) 0.25 MG tablet Take 1 tablet (0.25 mg total) by mouth 2 (two) times daily. 03/20/16   Biagio Borg, MD  ceFAZolin (ANCEF) 1-5 GM-% Inject 50 mLs (1 g total) into the vein every 8 (eight) hours. Please provide total of 10day supply. Patient not taking: Reported on 01/17/2016 01/01/16   Florencia Reasons, MD  citalopram (CELEXA) 10 MG tablet Take 1 tablet (10 mg total) by mouth daily. 10/12/15   Biagio Borg, MD  feeding supplement, ENSURE ENLIVE, (ENSURE ENLIVE) LIQD Take 237 mLs by mouth 3 (three) times daily between meals. 12/31/15   Florencia Reasons, MD  fluticasone  furoate-vilanterol (BREO ELLIPTA) 200-25 MCG/INH AEPB Inhale 1 puff into the lungs daily. 12/21/15   Rigoberto Noel, MD  furosemide (LASIX) 40 MG tablet Take 1 tablet (40 mg total) by mouth daily. 10/12/15   Biagio Borg, MD  guaiFENesin (MUCINEX) 600 MG 12 hr tablet Take 1 tablet (600 mg total) by mouth 2 (two) times daily. Patient not taking: Reported on 02/13/2016 12/27/15   Ripudeep Krystal Eaton, MD  levothyroxine (SYNTHROID, LEVOTHROID) 75 MCG tablet Take 1 tablet (75 mcg total) by mouth daily before breakfast. 12/31/15   Florencia Reasons, MD  OXYGEN Inhale 4 L into the lungs continuous.     Historical Provider, MD  potassium chloride (K-DUR,KLOR-CON) 10 MEQ tablet Take 10 mEq by mouth 2 (two) times daily.    Historical Provider, MD  tiotropium (SPIRIVA HANDIHALER) 18 MCG inhalation capsule Place 1 capsule (18 mcg total) into inhaler and inhale daily. 12/27/15   Ripudeep K Rai, MD   BP 103/81 mmHg  Pulse 90  Temp(Src) 97.8 F (36.6 C) (Axillary)  Resp 18  SpO2 98% Physical Exam  Constitutional: She is oriented to person, place, and time. She appears well-developed and well-nourished. She appears ill. No distress. Nasal cannula in place.  HENT:  Head: Normocephalic.  Eyes: Conjunctivae are normal.  Neck: Neck supple. No tracheal deviation present.  Cardiovascular: Normal rate and regular rhythm.   Pulmonary/Chest: Effort normal. Tachypnea noted. No respiratory distress. She has rales (to mid-lung fields b/l).  Abdominal: Soft. She exhibits no distension.  Neurological: She is alert and oriented to person, place, and time. GCS eye subscore is 4. GCS verbal subscore is 5. GCS motor subscore is 6.  Skin: Skin is warm and dry.  Psychiatric: She has a normal mood and affect.  Vitals reviewed.   ED Course  Procedures (including critical care time)  Emergency Focused Ultrasound Exam Limited Ultrasound of the Heart and Pericardium  Performed and interpreted by Dr. Laneta Simmers Indication: shortness of  breath Multiple views of the heart, pericardium, and IVC are obtained with a multi frequency probe.  Findings: decreased contractility with RV enlargement, some bowing of IV septum, no anechoic fluid in pericardium, small amount of FF outside lung, no IVC collapse Interpretation: slightly reduced ejection fraction with elevated RV:LV ratio and signs of strain, no pericardial effusion, elevated CVP Images archived electronically.  CPT Code: [id number]   Labs Review Labs Reviewed  CBC WITH DIFFERENTIAL/PLATELET - Abnormal; Notable for the following:    Monocytes Absolute 1.4 (*)    All other components within normal limits  BRAIN NATRIURETIC PEPTIDE - Abnormal; Notable for the following:    B Natriuretic Peptide 473.0 (*)    All other components within normal limits  COMPREHENSIVE METABOLIC PANEL - Abnormal; Notable for the following:    Potassium 5.5 (*)  Chloride 89 (*)    CO2 37 (*)    Glucose, Bld 197 (*)    BUN 32 (*)    Creatinine, Ser 1.18 (*)    Total Protein 6.4 (*)    Albumin 3.2 (*)    AST 204 (*)    ALT 216 (*)    GFR calc non Af Amer 45 (*)    GFR calc Af Amer 52 (*)    All other components within normal limits  TROPONIN I - Abnormal; Notable for the following:    Troponin I 0.09 (*)    All other components within normal limits  GLUCOSE, CAPILLARY - Abnormal; Notable for the following:    Glucose-Capillary 139 (*)    All other components within normal limits  I-STAT TROPOININ, ED - Abnormal; Notable for the following:    Troponin i, poc 0.09 (*)    All other components within normal limits  ETHANOL  TROPONIN I  TROPONIN I  URINE RAPID DRUG SCREEN, HOSP PERFORMED  HEPATITIS PANEL, ACUTE  COMPREHENSIVE METABOLIC PANEL    Imaging Review Ct Angio Chest Pe W/cm &/or Wo Cm  03/24/2016  CLINICAL DATA:  Pt breathing shallow. Hx COPD, CHF, PE. Denies pain at this time. Swelling noted to bilateral lower extremities. EXAM: CT ANGIOGRAPHY CHEST WITH CONTRAST TECHNIQUE:  Multidetector CT imaging of the chest was performed using the standard protocol during bolus administration of intravenous contrast. Multiplanar CT image reconstructions and MIPs were obtained to evaluate the vascular anatomy. CONTRAST:  75 mL Isovue 370 COMPARISON:  11/11/2011 FINDINGS: Mediastinum/Lymph Nodes: No pulmonary emboli or thoracic aortic dissection identified. No mediastinal mass. Mild right hilar lymphadenopathy with the largest lymph node measuring 15 mm. Lungs/Pleura: Bilateral centrilobular emphysema. Small bilateral pleural effusions, right greater than left. Bilateral mild interstitial thickening. Spiculated 13 x 13 mm left upper lobe pulmonary opacity. Spiculated 9 x 12 mm right upper lobe pulmonary opacity. Bibasilar atelectasis. Upper abdomen: No acute findings. Musculoskeletal: No chest wall mass or suspicious bone lesions identified. Review of the MIP images confirms the above findings. IMPRESSION: 1. No evidence pulmonary embolus. 2. Findings concerning for mild pulmonary edema. 3. Small bilateral pleural effusions, right greater than left. 4. Spiculated 13 x 13 mm left upper lobe pulmonary opacity. Spiculated 9 x 12 mm right upper lobe pulmonary opacity. These are new compared with 11/11/2011. These may reflect areas of airspace disease, but true pulmonary lesions cannot be excluded. Follow-up CT chest is recommended in 3 months. Electronically Signed   By: Kathreen Devoid   On: 03/24/2016 11:18   Dg Chest Port 1 View  03/24/2016  CLINICAL DATA:  Shortness of breath since Monday. EXAM: PORTABLE CHEST 1 VIEW COMPARISON:  12/27/2015 FINDINGS: Bilateral lower lobe airspace opacities are noted with small bilateral effusions. Heart is normal size. No acute bony abnormality. IMPRESSION: Small bilateral pleural effusions with bibasilar atelectasis or infiltrates. Electronically Signed   By: Rolm Baptise M.D.   On: 03/24/2016 09:31   I have personally reviewed and evaluated these images and lab  results as part of my medical decision-making.   EKG Interpretation   Date/Time:  Monday March 24 2016 09:01:11 EDT Ventricular Rate:  91 PR Interval:  130 QRS Duration: 83 QT Interval:  363 QTC Calculation: 447 R Axis:   104 Text Interpretation:  Sinus rhythm Ventricular premature complex Biatrial  enlargement Right axis deviation Consider left ventricular hypertrophy  Anterior Q waves, possibly due to LVH Borderline T abnormalities, inferior  leads New since previous  tracing Confirmed by Shamekia Tippets MD, Delila Kuklinski [phone]) on  03/24/2016 9:07:04 AM      MDM   Final diagnoses:  Acute on chronic respiratory failure with hypoxemia (HCC)  Acute pulmonary edema (HCC)  Bilateral pleural effusion    70 y.o. female presents with shortness of breath that has been progressive over 2 weeks but worsened today with notable b/l LE edema. Chest film shows edema and bilateral effusions likely c/w heart failure in this clinical context. Pt normally on 40 mg PO lasix daily so started with 80 mg IV here to begin diuresis. With swelling and h/o remote DVT she is moderate risk for PE, evidence of right heart enlargement concerning for increased pulmonary vascular pressure so CT for PE r/o done which is negative. Doubt ACS with mild troponin elevation likely 2/2 CHF, given ASA and will cycle as inpatient. Hospitalist was consulted for admission and will see the patient in the emergency department.    Leo Grosser, MD 03/24/16 [phone]

## 2016-03-24 NOTE — Progress Notes (Signed)
Wertman, PA paged to clarify orders for patient needing RASS score and Q1 Neuro checks. Awaiting page back.

## 2016-03-24 NOTE — H&P (Signed)
History and Physical    Jasmine [name] [id number] DOB: [birth year] DOA: 03/24/2016   PCP: [name], MD   Patient coming from:  [place] Chief Complaint: Shortness of breath   HPI: Jasmine [name] is a 70 y.o. female with medical history significant for  HTN, remote DVT not on anticoagulation, hypothyroidism,COPD  Asthma/Chronic bronchitis on Home O2 since last month, presenting to the  ED via EMS with acute onset of shortness of breath, accompanied by chest pain. She also reported swelling in both lower extremities. At the time of arrival, she has had shallow breathing,requiring in the with improvement of her O2 sats, present 60% on nasal cannula, to therapeutic levels thereafter.   History is obtained by her niece, as the patient is sedated at this time, sound asleep and unable to provide history. Niece reports that over the last month, she has had increased cough, with mucus production, and has been using rescue inhalers with relief. Otherwise, no other symptoms are reported that her daughter.she partakes of alcohol, and she also has a recent history of cocaine. She quit smoking several years ago.No prior history of cancer in the patient.  ED Course:  BP 115/72 mmHg  Pulse 78  Temp(Src) 98.5 F (36.9 C) (Oral)  Resp 22  Ht 5\' 1"  (1.549 m)  Wt 48.898 kg (107 lb 12.8 oz)  BMI 20.38 kg/m2  SpO2 95% BNP  473 troponin 0. 09. White count normal Creatinine 1.18. Glucose 197. AST/ALT 204/216 CT angiogram of the chest negative for PE, but Mild right hilar lymphadenopathy with the largest lymph node measuring 15 mm. Small bilateral pleural effusions, right greater than left. Bilateral mild interstitial thickening. Spiculated 13 x 13 mm left upper lobe pulmonary opacity. Spiculated 9 x 12 mm right upper lobe pulmonary opacity. Bibasilar atelectasis. EKG Sinus rhythm, Borderline T abnormalities, inferior leads New since previous tracing   Review of Systems: As per HPI otherwise 10  point review of systems negative.   Past Medical History  Diagnosis Date  . ALLERGIC RHINITIS 05/01/2007  . DEPRESSION 07/21/2008  . FATIGUE 10/10/2010  . HYPERTENSION 05/01/2007  . LUMBAR RADICULOPATHY, RIGHT 08/25/2008  . OSTEOPOROSIS 05/01/2007  . SHOULDER PAIN, LEFT 08/23/2009  . SINUSITIS, CHRONIC 10/10/2010  . SINUSITIS- ACUTE-NOS 10/10/2008  . DVT (deep venous thrombosis) (HCC) 1960s    LLE  . CHF (congestive heart failure) ([place])   . HYPERTHYROIDISM 11/23/2007    Pt endorses having had Graves disease, possibly radioactive iodine x1, but no thyroidectomy  . HYPOTHYROIDISM 08/23/2009  . ASTHMA 05/01/2007    "since I was a child"  . Chronic bronchitis ([place])   . Arthritis     "hands" (12/28/2015)  . Anxiety   . On home oxygen therapy     "3-4L; qd; all the time" (12/28/2015)    Past Surgical History  Procedure Laterality Date  . Appendectomy    . Ectopic pregnancy surgery  "late '60s or early '70s  . Oophorectomy Right   . Electrocardiogram  06/20/2006  . Dilation and curettage of uterus  multiple    history of multiple dialations and curettages and miscarriages, unfortunately never carrying a child to term    Social History  reports that she quit smoking about 8 months ago. Her smoking use included Cigarettes. She has a 54 pack-year smoking history. She has never used smokeless tobacco. She reports that she drinks alcohol. She reports that she uses illicit drugs ("Crack" cocaine) about twice per week.    Allergies  Allergen Reactions  . Alendronate Sodium Other (See Comments)    Pt does not remember this reaction    Family History  Problem Relation Age of Onset  . Alcohol abuse Brother   . Diabetes Brother   . Hypertension Other   . Lung cancer Father   . Stroke Mother   . Asthma      maternal aunts  . Thyroid disease Mother       Prior to Admission medications   Medication Sig Start Date End Date Taking? Authorizing Provider  albuterol (PROVENTIL HFA;VENTOLIN  HFA) 108 (90 Base) MCG/ACT inhaler Inhale 2 puffs into the lungs every 6 (six) hours as needed for wheezing or shortness of breath. 02/20/16  Yes Rigoberto Noel, MD  albuterol (PROVENTIL) (2.5 MG/3ML) 0.083% nebulizer solution Take 3 mLs (2.5 mg total) by nebulization every 4 (four) hours as needed for wheezing. 06/25/14  Yes Theodis Blaze, MD  ALPRAZolam Duanne Moron) 0.25 MG tablet Take 1 tablet (0.25 mg total) by mouth 2 (two) times daily. 03/20/16  Yes Biagio Borg, MD  citalopram (CELEXA) 10 MG tablet Take 1 tablet (10 mg total) by mouth daily. 10/12/15  Yes Biagio Borg, MD  feeding supplement, ENSURE ENLIVE, (ENSURE ENLIVE) LIQD Take 237 mLs by mouth 3 (three) times daily between meals. 12/31/15  Yes Florencia Reasons, MD  fluticasone furoate-vilanterol (BREO ELLIPTA) 200-25 MCG/INH AEPB Inhale 1 puff into the lungs daily. 12/21/15  Yes Rigoberto Noel, MD  furosemide (LASIX) 40 MG tablet Take 1 tablet (40 mg total) by mouth daily. 10/12/15  Yes Biagio Borg, MD  levothyroxine (SYNTHROID, LEVOTHROID) 75 MCG tablet Take 1 tablet (75 mcg total) by mouth daily before breakfast. 12/31/15  Yes Florencia Reasons, MD  OXYGEN Inhale 4 L into the lungs continuous.    Yes Historical Provider, MD  potassium chloride (K-DUR,KLOR-CON) 10 MEQ tablet Take 10 mEq by mouth 2 (two) times daily.   Yes Historical Provider, MD  tiotropium (SPIRIVA HANDIHALER) 18 MCG inhalation capsule Place 1 capsule (18 mcg total) into inhaler and inhale daily. 12/27/15  Yes Ripudeep Krystal Eaton, MD    Physical Exam:    Filed Vitals:   03/24/16 0945 03/24/16 1000 03/24/16 1130 03/24/16 1402  BP: 102/67 106/66 109/77 115/72  Pulse: 83 83 80 78  Temp:    98.5 F (36.9 C)  TempSrc:    Oral  Resp: 17 22 20 22   Height:    5\' 1"  (1.549 m)  Weight:    48.898 kg (107 lb 12.8 oz)  SpO2: 91% 93% 96% 95%      Constitutional: NAD, Sleeping comfortably.  Filed Vitals:   03/24/16 0945 03/24/16 1000 03/24/16 1130 03/24/16 1402  BP: 102/67 106/66 109/77 115/72  Pulse: 83  83 80 78  Temp:    98.5 F (36.9 C)  TempSrc:    Oral  Resp: 17 22 20 22   Height:    5\' 1"  (1.549 m)  Weight:    48.898 kg (107 lb 12.8 oz)  SpO2: 91% 93% 96% 95%   Eyes: PERRL, lids and conjunctivae normal ENMT: Mucous membranes are moist. Posterior pharynx clear of any exudate or lesions.Normal dentition.  Neck: normal, supple, no masses, no thyromegaly Respiratory: .decreased breath sounds at the bases without wheezing or rhonchi.Normal respiratory effort. No accessory muscle use.  Cardiovascular: Regular rate and rhythm, no murmurs / rubs / gallops. Bilateral, right greater than left 1+ lower extremity  edema. 2+ pedal pulses. No carotid bruits.  Abdomen: no tenderness, no masses palpated. No hepatosplenomegaly. Bowel sounds positive.  Musculoskeletal: no clubbing / cyanosis. No joint deformity upper and lower extremities. Good ROM, no contractures. Normal muscle tone.  Skin: no rashes, lesions, ulcers.  Neurologic: Cannot assess, patient sound asleep,      Labs on Admission: I have personally reviewed following labs and imaging studies  CBC:  Recent Labs Lab 03/24/16 0910  WBC 8.9  NEUTROABS 6.5  HGB 12.9  HCT 42.3  MCV 95.9  PLT [id number]    Basic Metabolic Panel:  Recent Labs Lab 03/24/16 0910  NA 137  K 5.5*  CL 89*  CO2 37*  GLUCOSE 197*  BUN 32*  CREATININE 1.18*  CALCIUM 9.3    GFR: Estimated Creatinine Clearance: 33 mL/min (by C-G formula based on Cr of 1.18).  Liver Function Tests:  Recent Labs Lab 03/24/16 0910  AST 204*  ALT 216*  ALKPHOS 84  BILITOT 1.0  PROT 6.4*  ALBUMIN 3.2*   No results for input(s): LIPASE, AMYLASE in the last 168 hours. No results for input(s): AMMONIA in the last 168 hours.  Coagulation Profile: No results for input(s): INR, PROTIME in the last 168 hours.  Cardiac Enzymes:  Recent Labs Lab 03/24/16 1412  TROPONINI 0.09*    BNP (last 3 results) No results for input(s): PROBNP in the last 8760  hours.  HbA1C: No results for input(s): HGBA1C in the last 72 hours.  CBG: No results for input(s): GLUCAP in the last 168 hours.  Lipid Profile: No results for input(s): CHOL, HDL, LDLCALC, TRIG, CHOLHDL, LDLDIRECT in the last 72 hours.  Thyroid Function Tests: No results for input(s): TSH, T4TOTAL, FREET4, T3FREE, THYROIDAB in the last 72 hours.  Anemia Panel: No results for input(s): VITAMINB12, FOLATE, FERRITIN, TIBC, IRON, RETICCTPCT in the last 72 hours.  Urine analysis:    Component Value Date/Time   COLORURINE YELLOW 12/28/2015 0049   APPEARANCEUR CLOUDY* 12/28/2015 0049   LABSPEC 1.025 12/28/2015 0049   PHURINE 7.5 12/28/2015 0049   GLUCOSEU 100* 12/28/2015 [place] 01/11/2015 1723   HGBUR NEGATIVE 12/28/2015 0049   BILIRUBINUR NEGATIVE 12/28/2015 0049   BILIRUBINUR negative 08/11/2011 [place] 12/28/2015 0049   PROTEINUR 100* 12/28/2015 0049   PROTEINUR negative 08/11/2011 1358   UROBILINOGEN 0.2 01/11/2015 1723   UROBILINOGEN negative 08/11/2011 1358   NITRITE NEGATIVE 12/28/2015 0049   NITRITE negative 08/11/2011 1358   LEUKOCYTESUR SMALL* 12/28/2015 0049    Sepsis Labs: @LABRCNTIP (procalcitonin:4,lacticidven:4) )No results found for this or any previous visit (from the past 240 hour(s)).   Radiological Exams on Admission: Ct Angio Chest Pe W/cm &/or Wo Cm  03/24/2016  CLINICAL DATA:  Pt breathing shallow. Hx COPD, CHF, PE. Denies pain at this time. Swelling noted to bilateral lower extremities. EXAM: CT ANGIOGRAPHY CHEST WITH CONTRAST TECHNIQUE: Multidetector CT imaging of the chest was performed using the standard protocol during bolus administration of intravenous contrast. Multiplanar CT image reconstructions and MIPs were obtained to evaluate the vascular anatomy. CONTRAST:  75 mL Isovue 370 COMPARISON:  11/11/2011 FINDINGS: Mediastinum/Lymph Nodes: No pulmonary emboli or thoracic aortic dissection identified. No  mediastinal mass. Mild right hilar lymphadenopathy with the largest lymph node measuring 15 mm. Lungs/Pleura: Bilateral centrilobular emphysema. Small bilateral pleural effusions, right greater than left. Bilateral mild interstitial thickening. Spiculated 13 x 13 mm left upper lobe pulmonary opacity. Spiculated 9 x 12 mm right upper lobe pulmonary opacity. Bibasilar atelectasis. Upper abdomen: No acute findings. Musculoskeletal:  No chest wall mass or suspicious bone lesions identified. Review of the MIP images confirms the above findings. IMPRESSION: 1. No evidence pulmonary embolus. 2. Findings concerning for mild pulmonary edema. 3. Small bilateral pleural effusions, right greater than left. 4. Spiculated 13 x 13 mm left upper lobe pulmonary opacity. Spiculated 9 x 12 mm right upper lobe pulmonary opacity. These are new compared with 11/11/2011. These may reflect areas of airspace disease, but true pulmonary lesions cannot be excluded. Follow-up CT chest is recommended in 3 months. Electronically Signed   By: Kathreen Devoid   On: 03/24/2016 11:18   Dg Chest Port 1 View  03/24/2016  CLINICAL DATA:  Shortness of breath since Monday. EXAM: PORTABLE CHEST 1 VIEW COMPARISON:  12/27/2015 FINDINGS: Bilateral lower lobe airspace opacities are noted with small bilateral effusions. Heart is normal size. No acute bony abnormality. IMPRESSION: Small bilateral pleural effusions with bibasilar atelectasis or infiltrates. Electronically Signed   By: Rolm Baptise M.D.   On: 03/24/2016 09:31    EKG: Independently reviewed.  Assessment/Plan Active Problems:   Depression with anxiety   Essential hypertension   COPD (chronic obstructive pulmonary disease) (HCC)   Hypothyroidism   On home oxygen therapy   CHF (congestive heart failure) (HCC)   Chest pain   Lung mass   Abnormal LFTs   Elevated blood sugar  Acute respiratory failure with hypoxia in a patient with COPD on O2 at home, and in the setting of Acute on  chronic diastolic CHF, new diagnosis of  Malignant lung masses, and bilateral pleural effusion  last echocardiogram 12/28/2015  with vigorous systolic function  EF [id number] to 70%.  Grade 1 diastolic dysfunction  BNP 273. CXR small bilat pleural effusions. Initially on NRB due to Osats down to the 60s, now back on O2 .She was compliant with meds - Daily weights, strict I/O  diuresis w Lasix 40 mg IV bid. Will adjust as needed Continue home nebs    Chest pain, now resolved, cardiac versus musculoskeletal versus drug induced. Heart score 4-5 EKG Sinus rhythm, Borderline T abnormalities, inferior leads New since previous tracing. CT angio neg PE. Recent cocaine use.  Chest pain is now resolved,  Tn less than 0.3 Serial Tn  UDS continue ASA, O2 and NTG as needed    New Bilateral lung masses CT angiogram of the chest negative for PE, but Mild right hilar lymphadenopathy and bilateral Spiculated opacities suspicious for malignancy  Will initiate w/u while here.  Staging CT A/P  Will need tissue diagnosis, will await for CT abdomen pelvis report.       Abnormal LFTs  H/o ETOH,  No history of  hepatitis per chart, Bili normal AST/ALT 204/216 CT abdomen and pelvis pending Check ETOH  Hepatitis panel Repeat CMET in am  Hyperglycemia in the setting of ETOH . Glu 193  Prediabetic.  Lab Results  Component Value Date   HGBA1C 6.3* 12/29/2015  SSI Diabetic diet.   Anxiety/Depression Continue Xanax and Celexa   Substance abuse Will need  Substance counseling   CIWA protocol    DVT prophylaxis: Heparin  Code Status:   Full   Family Communication:  Discussed with niece Disposition Plan: Expect patient to be discharged to home after condition improves Consults called:    None Admission status:Tele  Obs    Denica Web E, PA-C [facility]   If 7PM-7AM, please contact night-coverage [email or web address] Password [facility]  03/24/2016, 3:54 PM

## 2016-03-24 NOTE — Progress Notes (Signed)
Paged PA regarding RASS score and q1 neuro checks. Awaiting page back, will continue to monitor.

## 2016-03-24 NOTE — ED Notes (Signed)
Per EMS - pt from [facility] EMS called this morning for CP. Pt initially 60% on [place], placed on NRB. Given 324mg  aspirin. Pt breathing shallow. Hx COPD, CHF, PE.  Denies pain at this time. Swelling noted to bilateral lower extremities.

## 2016-03-25 ENCOUNTER — Observation Stay (HOSPITAL_COMMUNITY): Payer: Commercial Managed Care - HMO

## 2016-03-25 DIAGNOSIS — M5416 Radiculopathy, lumbar region: Secondary | ICD-10-CM | POA: Diagnosis present

## 2016-03-25 DIAGNOSIS — R7989 Other specified abnormal findings of blood chemistry: Secondary | ICD-10-CM | POA: Diagnosis not present

## 2016-03-25 DIAGNOSIS — Z833 Family history of diabetes mellitus: Secondary | ICD-10-CM | POA: Diagnosis not present

## 2016-03-25 DIAGNOSIS — Z811 Family history of alcohol abuse and dependence: Secondary | ICD-10-CM | POA: Diagnosis not present

## 2016-03-25 DIAGNOSIS — Z8249 Family history of ischemic heart disease and other diseases of the circulatory system: Secondary | ICD-10-CM | POA: Diagnosis not present

## 2016-03-25 DIAGNOSIS — R7303 Prediabetes: Secondary | ICD-10-CM | POA: Diagnosis present

## 2016-03-25 DIAGNOSIS — Z87891 Personal history of nicotine dependence: Secondary | ICD-10-CM | POA: Diagnosis not present

## 2016-03-25 DIAGNOSIS — Z888 Allergy status to other drugs, medicaments and biological substances status: Secondary | ICD-10-CM | POA: Diagnosis not present

## 2016-03-25 DIAGNOSIS — Z823 Family history of stroke: Secondary | ICD-10-CM | POA: Diagnosis not present

## 2016-03-25 DIAGNOSIS — J329 Chronic sinusitis, unspecified: Secondary | ICD-10-CM | POA: Diagnosis present

## 2016-03-25 DIAGNOSIS — R918 Other nonspecific abnormal finding of lung field: Secondary | ICD-10-CM | POA: Diagnosis present

## 2016-03-25 DIAGNOSIS — I5033 Acute on chronic diastolic (congestive) heart failure: Secondary | ICD-10-CM | POA: Diagnosis present

## 2016-03-25 DIAGNOSIS — J9811 Atelectasis: Secondary | ICD-10-CM | POA: Diagnosis present

## 2016-03-25 DIAGNOSIS — Z825 Family history of asthma and other chronic lower respiratory diseases: Secondary | ICD-10-CM | POA: Diagnosis not present

## 2016-03-25 DIAGNOSIS — Z7951 Long term (current) use of inhaled steroids: Secondary | ICD-10-CM | POA: Diagnosis not present

## 2016-03-25 DIAGNOSIS — J9621 Acute and chronic respiratory failure with hypoxia: Principal | ICD-10-CM

## 2016-03-25 DIAGNOSIS — Z9981 Dependence on supplemental oxygen: Secondary | ICD-10-CM | POA: Diagnosis not present

## 2016-03-25 DIAGNOSIS — Z801 Family history of malignant neoplasm of trachea, bronchus and lung: Secondary | ICD-10-CM | POA: Diagnosis not present

## 2016-03-25 DIAGNOSIS — E039 Hypothyroidism, unspecified: Secondary | ICD-10-CM | POA: Diagnosis present

## 2016-03-25 DIAGNOSIS — J449 Chronic obstructive pulmonary disease, unspecified: Secondary | ICD-10-CM | POA: Diagnosis present

## 2016-03-25 DIAGNOSIS — M810 Age-related osteoporosis without current pathological fracture: Secondary | ICD-10-CM | POA: Diagnosis present

## 2016-03-25 DIAGNOSIS — I248 Other forms of acute ischemic heart disease: Secondary | ICD-10-CM | POA: Diagnosis present

## 2016-03-25 DIAGNOSIS — R739 Hyperglycemia, unspecified: Secondary | ICD-10-CM | POA: Diagnosis present

## 2016-03-25 DIAGNOSIS — E0500 Thyrotoxicosis with diffuse goiter without thyrotoxic crisis or storm: Secondary | ICD-10-CM | POA: Diagnosis present

## 2016-03-25 DIAGNOSIS — R0602 Shortness of breath: Secondary | ICD-10-CM | POA: Diagnosis present

## 2016-03-25 DIAGNOSIS — I110 Hypertensive heart disease with heart failure: Secondary | ICD-10-CM | POA: Diagnosis present

## 2016-03-25 DIAGNOSIS — F418 Other specified anxiety disorders: Secondary | ICD-10-CM | POA: Diagnosis present

## 2016-03-25 LAB — HEPATITIS PANEL, ACUTE
HCV Ab: <0.1 s/co ratio (ref 0.0–0.9)
HEP B S AG: NEGATIVE
Hep A IgM: NEGATIVE
Hep B C IgM: NEGATIVE

## 2016-03-25 LAB — MRSA PCR SCREENING: MRSA BY PCR: NEGATIVE

## 2016-03-25 LAB — GLUCOSE, CAPILLARY
GLUCOSE-CAPILLARY: 98 mg/dL (ref 65–99)
GLUCOSE-CAPILLARY: 125 mg/dL — AB (ref 65–99)
Glucose-Capillary: 124 mg/dL — ABNORMAL HIGH (ref 65–99)
Glucose-Capillary: 118 mg/dL — ABNORMAL HIGH (ref 65–99)

## 2016-03-25 LAB — PROTIME-INR
INR: 1.58 — AB (ref 0.00–1.49)
Prothrombin Time: 18.9 seconds — ABNORMAL HIGH (ref 11.6–15.2)

## 2016-03-25 LAB — RAPID URINE DRUG SCREEN, HOSP PERFORMED
Amphetamines: NOT DETECTED
Barbiturates: NOT DETECTED
Benzodiazepines: POSITIVE — AB
Cocaine: NOT DETECTED
OPIATES: NOT DETECTED
TETRAHYDROCANNABINOL: NOT DETECTED

## 2016-03-25 LAB — TROPONIN I: Troponin I: 0.06 ng/mL — ABNORMAL HIGH (ref <0.031)

## 2016-03-25 LAB — COMPREHENSIVE METABOLIC PANEL
ALT: 180 U/L — ABNORMAL HIGH (ref 14–54)
AST: 118 U/L — ABNORMAL HIGH (ref 15–41)
Albumin: 3 g/dL — ABNORMAL LOW (ref 3.5–5.0)
Alkaline Phosphatase: 74 U/L (ref 38–126)
Anion gap: 9 (ref 5–15)
BUN: 21 mg/dL — AB (ref 6–20)
CALCIUM: 9 mg/dL (ref 8.9–10.3)
CHLORIDE: 85 mmol/L — AB (ref 101–111)
CO2: 43 mmol/L — AB (ref 22–32)
CREATININE: 0.75 mg/dL (ref 0.44–1.00)
Glucose, Bld: 124 mg/dL — ABNORMAL HIGH (ref 65–99)
Potassium: 4.3 mmol/L (ref 3.5–5.1)
SODIUM: 137 mmol/L (ref 135–145)
Total Bilirubin: 0.8 mg/dL (ref 0.3–1.2)
Total Protein: 6.5 g/dL (ref 6.5–8.1)

## 2016-03-25 NOTE — Progress Notes (Signed)
PROGRESS NOTE  Jasmine [name] [id number] DOB: 09-08-1946 DOA: 03/24/2016 PCP: [name], MD  HPI/Recap of past 24 hours: Jasmine [name] is a 70 y.o. female with a Past Medical History of depression, HTN, lumbar radiculopathy on the right/sciatic, chronic sinusitis, DVT, CHF, thyroid dysfunction, asthma who presents with acute on chronic respiratory failure likely secondary to mild CHF exacerbation and newly found bilateral lung masses with small pleural effusions. Diuresis, and CT abd/pelvis non-contrast without masses.  Assessment/Plan: Acute respiratory failure with hypoxia in a patient with COPD on O2 at home, and in the setting of Acute on chronic diastolic CHF, new diagnosis of Malignant lung masses, and bilateral pleural effusion last echocardiogram 12/28/2015 with vigorous systolic function EF [id number] to 70%. Grade 1 diastolic dysfunction  BNP 273. CXR small bilat pleural effusions. Initially on NRB due to Osats down to the 60s, now back on O2 .She was compliant with meds - Daily weights, strict I/O diuresis w Lasix 40 mg IV bid. She is feeling better, but still with sig edema Continue home nebs, RT consulted   Chest pain, now resolved, cardiac versus musculoskeletal versus drug induced. Heart score 4-5 EKG Sinus rhythm, Borderline T abnormalities, inferior leads New since previous tracing. CT angio neg PE. Recent cocaine use. Chest pain is now resolved,  Tn less than 0.3 Serial Tn  UDS continue ASA, O2 and NTG as needed   New Bilateral lung masses CT angiogram of the chest negative for PE, but Mild right hilar lymphadenopathy and bilateral Spiculated opacities suspicious for malignancy  CT A/P without masses Thorcentesis today for attempt at tissue diagnosis. Onc consult to follow, likely as outpatient.   Abnormal LFTs H/o ETOH, No history of hepatitis per chart, Bili normal AST/ALT 204/216 CT abdomen and pelvis pending Check ETOH  Hepatitis panel Follow  labs.  Hyperglycemia in the setting of ETOH . Glu 193 Prediabetic.   Recent Labs    Lab Results  Component Value Date   HGBA1C 6.3* 12/29/2015    SSI Diabetic diet.   Anxiety/Depression Continue Xanax and Celexa   Substance abuse Will need Substance counseling  CIWA protocol       Code Status: FULL   Family Communication: None   Disposition Plan: Likely to home in 2-3 days.    Consultants:  None   Procedures:  Thoracentesis 6/6  CT abd/pelv 6/5   Antimicrobials:  None    Objective: Filed Vitals:   03/24/16 2100 03/24/16 2151 03/25/16 0444 03/25/16 0825  BP: 110/70 121/78 116/66 102/64  Pulse: 70 80 82 70  Temp:  98.9 F (37.2 C) 98.7 F (37.1 C) 98.8 F (37.1 C)  TempSrc:  Oral Oral Oral  Resp:  22 20 20   Height:      Weight:   47.854 kg (105 lb 8 oz)   SpO2:  95% 92% 94%    Intake/Output Summary (Last 24 hours) at 03/25/16 0844 Last data filed at 03/25/16 0447  Gross per 24 hour  Intake      0 ml  Output   1100 ml  Net  -1100 ml   Filed Weights   03/24/16 1402 03/25/16 0444  Weight: 48.898 kg (107 lb 12.8 oz) 47.854 kg (105 lb 8 oz)    Exam: General:  Alert, oriented, calm, in no acute distress, thin Eyes: pupils round and reactive to light and accomodation, clear sclerea Neck: supple, no masses, trachea mildline  Cardiovascular: RRR, no murmurs or rubs, 2+ LE edema to knees Respiratory:  clear to auscultation bilaterally, diminished at B bases, no wheezes Abdomen: soft, nontender, nondistended, normal bowel tones heard  Skin: dry, no rashes  Musculoskeletal: no joint effusions, normal range of motion  Psychiatric: appropriate affect, normal speech  Neurologic: extraocular muscles intact, clear speech, moving all extremities with intact sensorium    Data Reviewed: CBC:  Recent Labs Lab 03/24/16 0910  WBC 8.9  NEUTROABS 6.5  HGB 12.9  HCT 42.3  MCV 95.9  PLT [id number]   Basic Metabolic Panel:  Recent Labs Lab  03/24/16 0910 03/25/16 0256  NA 137 137  K 5.5* 4.3  CL 89* 85*  CO2 37* 43*  GLUCOSE 197* 124*  BUN 32* 21*  CREATININE 1.18* 0.75  CALCIUM 9.3 9.0   GFR: Estimated Creatinine Clearance: 48.7 mL/min (by C-G formula based on Cr of 0.75). Liver Function Tests:  Recent Labs Lab 03/24/16 0910 03/25/16 0256  AST 204* 118*  ALT 216* 180*  ALKPHOS 84 74  BILITOT 1.0 0.8  PROT 6.4* 6.5  ALBUMIN 3.2* 3.0*   No results for input(s): LIPASE, AMYLASE in the last 168 hours. No results for input(s): AMMONIA in the last 168 hours. Coagulation Profile: No results for input(s): INR, PROTIME in the last 168 hours. Cardiac Enzymes:  Recent Labs Lab 03/24/16 1412 03/24/16 2109 03/25/16 0256  TROPONINI 0.09* 0.08* 0.06*   BNP (last 3 results) No results for input(s): PROBNP in the last 8760 hours. HbA1C: No results for input(s): HGBA1C in the last 72 hours. CBG:  Recent Labs Lab 03/24/16 1705 03/24/16 2157 03/25/16 0611  GLUCAP 139* 164* 124*   Lipid Profile: No results for input(s): CHOL, HDL, LDLCALC, TRIG, CHOLHDL, LDLDIRECT in the last 72 hours. Thyroid Function Tests: No results for input(s): TSH, T4TOTAL, FREET4, T3FREE, THYROIDAB in the last 72 hours. Anemia Panel: No results for input(s): VITAMINB12, FOLATE, FERRITIN, TIBC, IRON, RETICCTPCT in the last 72 hours. Urine analysis:    Component Value Date/Time   COLORURINE YELLOW 12/28/2015 0049   APPEARANCEUR CLOUDY* 12/28/2015 0049   LABSPEC 1.025 12/28/2015 0049   PHURINE 7.5 12/28/2015 0049   GLUCOSEU 100* 12/28/2015 0049   GLUCOSEU NEGATIVE 01/11/2015 1723   HGBUR NEGATIVE 12/28/2015 0049   BILIRUBINUR NEGATIVE 12/28/2015 0049   BILIRUBINUR negative 08/11/2011 1358   [place] 12/28/2015 0049   PROTEINUR 100* 12/28/2015 0049   PROTEINUR negative 08/11/2011 1358   UROBILINOGEN 0.2 01/11/2015 1723   UROBILINOGEN negative 08/11/2011 1358   NITRITE NEGATIVE 12/28/2015 0049   NITRITE negative  08/11/2011 1358   LEUKOCYTESUR SMALL* 12/28/2015 0049   Sepsis Labs: @LABRCNTIP (procalcitonin:4,lacticidven:4)  ) Recent Results (from the past 240 hour(s))  MRSA PCR Screening     Status: None   Collection Time: 03/24/16  8:36 PM  Result Value Ref Range Status   MRSA by PCR NEGATIVE NEGATIVE Final    Comment:        The GeneXpert MRSA Assay (FDA approved for NASAL specimens only), is one component of a comprehensive MRSA colonization surveillance program. It is not intended to diagnose MRSA infection nor to guide or monitor treatment for MRSA infections.       Studies: Ct Abdomen Pelvis Wo Contrast  03/24/2016  CLINICAL DATA:  Recent lung masses. EXAM: CT ABDOMEN AND PELVIS WITHOUT CONTRAST TECHNIQUE: Multidetector CT imaging of the abdomen and pelvis was performed following the standard protocol without IV contrast. COMPARISON:  None. FINDINGS: Moderate bilateral pleural effusions and underlying atelectasis are identified. No other abnormalities seen in the lung bases. No  free air. No definite free fluid identified. Evaluation of parenchymal organs is limited due to lack of intravenous contrast. High attenuation is seen in the bilateral renal collecting systems, consistent with a contrast enhanced study earlier today. This limits evaluation for renal stones but none are seen. There is a cyst in the lower right kidney. No suspicious renal masses or hydronephrosis. High attenuation posteriorly in the gallbladder is suspicious for sludge or stones. The liver and spleen are normal. The adrenal glands are unremarkable. Limited views of the pancreas demonstrate no abnormalities. There is atherosclerosis in the non aneurysmal abdominal aorta. No adenopathy is identified. The stomach and small bowel are unremarkable. Evaluation of colon is limited as the colon is largely not opacified with contrast. The appendix is not seen but there is no secondary evidence of appendicitis. There is increased  attenuation in the subcutaneous fat in the fat of the mesentery, suggesting volume overload. No adenopathy or suspicious masses seen in the pelvis. The bladder is opacified with contrast with no filling defects. The visualized bones are unremarkable other than degenerative changes. No suspicious bony lesions. IMPRESSION: 1. Evaluation is limited without intravenous contrast. No suspicious masses are seen in the abdomen. 2. Bilateral pleural effusions and increased attenuation in the subcutaneous and mesenteric fat consistent with volume overload. 3. Probable stones or sludge in the gallbladder. Electronically Signed   By: Dorise Bullion III M.D   On: 03/24/2016 19:06   Ct Angio Chest Pe W/cm &/or Wo Cm  03/24/2016  CLINICAL DATA:  Pt breathing shallow. Hx COPD, CHF, PE. Denies pain at this time. Swelling noted to bilateral lower extremities. EXAM: CT ANGIOGRAPHY CHEST WITH CONTRAST TECHNIQUE: Multidetector CT imaging of the chest was performed using the standard protocol during bolus administration of intravenous contrast. Multiplanar CT image reconstructions and MIPs were obtained to evaluate the vascular anatomy. CONTRAST:  75 mL Isovue 370 COMPARISON:  11/11/2011 FINDINGS: Mediastinum/Lymph Nodes: No pulmonary emboli or thoracic aortic dissection identified. No mediastinal mass. Mild right hilar lymphadenopathy with the largest lymph node measuring 15 mm. Lungs/Pleura: Bilateral centrilobular emphysema. Small bilateral pleural effusions, right greater than left. Bilateral mild interstitial thickening. Spiculated 13 x 13 mm left upper lobe pulmonary opacity. Spiculated 9 x 12 mm right upper lobe pulmonary opacity. Bibasilar atelectasis. Upper abdomen: No acute findings. Musculoskeletal: No chest wall mass or suspicious bone lesions identified. Review of the MIP images confirms the above findings. IMPRESSION: 1. No evidence pulmonary embolus. 2. Findings concerning for mild pulmonary edema. 3. Small bilateral  pleural effusions, right greater than left. 4. Spiculated 13 x 13 mm left upper lobe pulmonary opacity. Spiculated 9 x 12 mm right upper lobe pulmonary opacity. These are new compared with 11/11/2011. These may reflect areas of airspace disease, but true pulmonary lesions cannot be excluded. Follow-up CT chest is recommended in 3 months. Electronically Signed   By: Kathreen Devoid   On: 03/24/2016 11:18   Dg Chest Port 1 View  03/24/2016  CLINICAL DATA:  Shortness of breath since Monday. EXAM: PORTABLE CHEST 1 VIEW COMPARISON:  12/27/2015 FINDINGS: Bilateral lower lobe airspace opacities are noted with small bilateral effusions. Heart is normal size. No acute bony abnormality. IMPRESSION: Small bilateral pleural effusions with bibasilar atelectasis or infiltrates. Electronically Signed   By: Rolm Baptise M.D.   On: 03/24/2016 09:31    Scheduled Meds: . ALPRAZolam  0.25 mg Oral BID  . aspirin EC  81 mg Oral Daily  . citalopram  10 mg Oral Daily  .  fluticasone furoate-vilanterol  1 puff Inhalation Daily  . folic acid  1 mg Oral Daily  . folic acid  1 mg Oral Daily  . furosemide  40 mg Intravenous BID  . insulin aspart  0-9 Units Subcutaneous TID WC  . levothyroxine  75 mcg Oral QAC breakfast  . multivitamin with minerals  1 tablet Oral Daily  . multivitamin with minerals  1 tablet Oral Daily  . sodium chloride flush  3 mL Intravenous Q12H  . thiamine  100 mg Oral Daily   Or  . thiamine  100 mg Intravenous Daily  . thiamine  100 mg Oral Daily  . tiotropium  18 mcg Inhalation Daily    Continuous Infusions:      Time spent: 28 minutes  Zahari Fazzino Marry Guan, MD [facility] Pager [phone]  If 7PM-7AM, please contact night-coverage [email or web address] Password [facility] 03/25/2016, 8:44 AM

## 2016-03-25 NOTE — Progress Notes (Addendum)
Pt  slept well overnight, incontinent at times, Urine for drug screen sent . MRSA swab sent and came out negative, will continue to monitor the patient.

## 2016-03-25 NOTE — NC FL2 (Addendum)
[place] LEVEL OF CARE SCREENING TOOL     IDENTIFICATION  Patient Name: Jasmine [name] Birthdate: June 17, 1946 Sex: female Admission Date (Current Location): 03/24/2016  [facility] and [place] Number:  Herbalist and Address:  [facility] [place], [place] [address] [place],  [address]      Provider Number: [id number]  Attending Physician Name and Address:  Mir Marry Guan,*  Relative Name and Phone Number:       Current Level of Care: Hospital Recommended Level of Care: [place] Prior Approval Number:    Date Approved/Denied:   PASRR Number:    Discharge Plan: Other (Comment) (ALF)    Current Diagnoses: Patient Active Problem List   Diagnosis Date Noted  . CHF (congestive heart failure) ([place]) 03/24/2016  . Chest pain 03/24/2016  . Lung mass 03/24/2016  . Abnormal LFTs 03/24/2016  . Elevated blood sugar 03/24/2016  . Acute on chronic respiratory failure with hypoxemia ([place])   . Bilateral pleural effusion   . Solitary pulmonary nodule 01/09/2016  . Chronic respiratory failure with hypoxia ([place])   . On home oxygen therapy   . Chronic diastolic congestive heart failure ([place])   . Staphylococcus aureus bacteremia 12/28/2015  . Bacteremia Step viridans 12/27/2015  . Hypothyroidism 12/27/2015  . Hypomagnesemia 12/25/2015  . Acute on chronic respiratory failure ([place]) 12/25/2015  . Tobacco abuse disorder 12/25/2015  . Chronic respiratory failure ([place]) 11/30/2014  . Hypothyroidism following radioiodine therapy 09/26/2014  . Protein-calorie malnutrition, severe ([place]) 06/23/2014  . Hypercarbia 06/20/2014  . Cor pulmonale ([place]) 01/12/2013  . Smoker 01/12/2013  . COPD (chronic obstructive pulmonary disease) ([place]) 11/29/2011  . Macrocytosis without anemia 11/09/2011  . Preventative health care 04/09/2011  . SINUSITIS, CHRONIC 10/10/2010  . FATIGUE 10/10/2010  . Depression with anxiety 07/21/2008  . Essential  hypertension 05/01/2007  . ALLERGIC RHINITIS 05/01/2007  . ASTHMA 05/01/2007  . OSTEOPOROSIS 05/01/2007    Orientation RESPIRATION BLADDER Height & Weight     Self, Place  O2 (Nasal Canula 4 L) Incontinent Weight: 105 lb 8 oz (47.854 kg) (scale a) Height:  5\' 1"  (154.9 cm)  BEHAVIORAL SYMPTOMS/MOOD NEUROLOGICAL BOWEL NUTRITION STATUS   (None)  (None) Continent Diet (Carb-modified)  AMBULATORY STATUS COMMUNICATION OF NEEDS Skin   Limited Assist Verbally Normal                       Personal Care Assistance Level of Assistance  Bathing, Feeding, Dressing Bathing Assistance: Limited assistance Feeding assistance: Independent Dressing Assistance: Limited assistance     Functional Limitations Info  Sight, Hearing, Speech Sight Info: Adequate Hearing Info: Adequate Speech Info: Adequate    SPECIAL CARE FACTORS FREQUENCY  Blood pressure                    Contractures Contractures Info: Not present    Additional Factors Info  Code Status, Allergies, Psychotropic Code Status Info: Full Allergies Info: Alendronate Sodium Psychotropic Info: Depression, Anxiety: Xanax 0.25 mg PO BID, Xanax 0.25 mg PO BID prn, Celexa 10 mg PO daily, Ativan 1 mg PO every 6 hours, Ativan 1 mg IV every 6 hours prn.         Current Medications (03/25/2016):  This is the current hospital active medication list Current Facility-Administered Medications  Medication Dose Route Frequency Provider Last Rate Last Dose  . 0.9 %  sodium chloride infusion  250 mL Intravenous PRN Rondel Jumbo, PA-C      .  acetaminophen (TYLENOL) tablet 650 mg  650 mg Oral Q4H PRN Rondel Jumbo, PA-C      . albuterol (PROVENTIL) (2.5 MG/3ML) 0.083% nebulizer solution 2.5 mg  2.5 mg Nebulization Q4H PRN Rondel Jumbo, PA-C      . ALPRAZolam Duanne Moron) tablet 0.25 mg  0.25 mg Oral BID Rondel Jumbo, PA-C   0.25 mg at 03/25/16 1002  . ALPRAZolam Duanne Moron) tablet 0.25 mg  0.25 mg Oral BID PRN Rondel Jumbo, PA-C       . aspirin EC tablet 81 mg  81 mg Oral Daily Rondel Jumbo, PA-C   81 mg at 03/25/16 1002  . citalopram (CELEXA) tablet 10 mg  10 mg Oral Daily Rondel Jumbo, PA-C   10 mg at 03/25/16 1002  . fluticasone furoate-vilanterol (BREO ELLIPTA) 200-25 MCG/INH 1 puff  1 puff Inhalation Daily Rondel Jumbo, PA-C   1 puff at 03/24/16 1500  . folic acid (FOLVITE) tablet 1 mg  1 mg Oral Daily Rondel Jumbo, PA-C   1 mg at 03/24/16 1659  . folic acid (FOLVITE) tablet 1 mg  1 mg Oral Daily Waldemar Dickens, MD   1 mg at 03/25/16 1003  . furosemide (LASIX) injection 40 mg  40 mg Intravenous BID Rondel Jumbo, PA-C   40 mg at 03/25/16 1003  . insulin aspart (novoLOG) injection 0-9 Units  0-9 Units Subcutaneous TID WC Rondel Jumbo, PA-C   1 Units at 03/25/16 [id number]  . levothyroxine (SYNTHROID, LEVOTHROID) tablet 75 mcg  75 mcg Oral QAC breakfast Rondel Jumbo, PA-C   75 mcg at 03/25/16 1003  . LORazepam (ATIVAN) tablet 1 mg  1 mg Oral Q6H PRN Waldemar Dickens, MD       Or  . LORazepam (ATIVAN) injection 1 mg  1 mg Intravenous Q6H PRN Waldemar Dickens, MD      . multivitamin with minerals tablet 1 tablet  1 tablet Oral Daily Rondel Jumbo, PA-C   1 tablet at 03/24/16 1659  . multivitamin with minerals tablet 1 tablet  1 tablet Oral Daily Waldemar Dickens, MD   1 tablet at 03/25/16 1002  . ondansetron (ZOFRAN) injection 4 mg  4 mg Intravenous Q6H PRN Rondel Jumbo, PA-C      . sodium chloride flush (NS) 0.9 % injection 3 mL  3 mL Intravenous Q12H Rondel Jumbo, PA-C   3 mL at 03/25/16 1000  . sodium chloride flush (NS) 0.9 % injection 3 mL  3 mL Intravenous PRN Rondel Jumbo, PA-C      . thiamine (VITAMIN B-1) tablet 100 mg  100 mg Oral Daily Rondel Jumbo, PA-C   100 mg at 03/25/16 1002  . tiotropium (SPIRIVA) inhalation capsule 18 mcg  18 mcg Inhalation Daily Rondel Jumbo, PA-C   18 mcg at 03/25/16 0840     Discharge Medications: Please see discharge summary for a list of discharge  medications.  Relevant Imaging Results:  Relevant Lab Results:   Additional Information SS#: [id number]  Candie Chroman, LCSW

## 2016-03-25 NOTE — Clinical Social Work Note (Signed)
Clinical Social Work Assessment  Patient Details  Name: Jasmine [name] MRN: [id number] Date of Birth: 1946-06-17  Date of referral:  03/25/16               Reason for consult:  Facility Placement, Discharge Planning                Permission sought to share information with:  Facility Sport and exercise psychologist, Family Supports Permission granted to share information::  Yes, Verbal Permission Granted  Name::     Jasmine [name]  Agency::  [facility]  Relationship::  [facility]  Contact Information:  S: [phone], Mamie Nick: [phone]  Housing/Transportation Living arrangements for the past 2 months:  [place] of Information:  Patient, Other (Comment Required) ([facility] Patient Interpreter Needed:  None Criminal Activity/Legal Involvement Pertinent to Current Situation/Hospitalization:  No - Comment as needed Significant Relationships:  Adult Children, Other Family Members Lives with:  Facility Resident Do you feel safe going back to the place where you live?  Yes Need for family participation in patient care:  Yes (Comment)  Care giving concerns:  Patient from [facility].   Social Worker assessment / plan:  CSW met with patient. Two nieces and another relative at bedside. CSW introduced role and explained that discharge planning would be discussed. Patient and family agreeable to return to ALF. Patient's niece, Jasmine [name], will not be legal guardian until tomorrow. Patient will need PTAR. No further concerns. CSW encouraged patient and her family to contact CSW as needed. CSW will continue to follow patient and her family for support and facilitate discharge back to ALF once medically stable.  Employment status:  Retired Nurse, adult PT Recommendations:  Not assessed at this time Information / Referral to community resources:  Other (Comment Required) (None. Will return to ALF.)  Patient/Family's  Response to care:  Patient and family agreeable to return to ALF. Patient's family supportive and involved in patient care. Patient and family polite and appreciated social work intervention.  Patient/Family's Understanding of and Emotional Response to Diagnosis, Current Treatment, and Prognosis:  Patient not fully oriented. Patient's family knowledgeable of medical interventions and aware of return to ALF once medically stable for discharge.  Emotional Assessment Appearance:  Appears stated age Attitude/Demeanor/Rapport:   (Pleasant) Affect (typically observed):  Accepting, Appropriate, Calm, Pleasant Orientation:  Oriented to Self, Oriented to Place Alcohol / Substance use:  Tobacco Use Psych involvement (Current and /or in the community):  No (Comment)  Discharge Needs  Concerns to be addressed:  Care Coordination Readmission within the last 30 days:  No Current discharge risk:  None Barriers to Discharge:  No Barriers Identified   Candie Chroman, LCSW 03/25/2016, 2:21 PM

## 2016-03-26 ENCOUNTER — Ambulatory Visit: Payer: Commercial Managed Care - HMO | Admitting: Pulmonary Disease

## 2016-03-26 ENCOUNTER — Inpatient Hospital Stay (HOSPITAL_COMMUNITY): Payer: Commercial Managed Care - HMO

## 2016-03-26 LAB — BODY FLUID CELL COUNT WITH DIFFERENTIAL
Lymphs, Fluid: 48 %
Monocyte-Macrophage-Serous Fluid: 23 % — ABNORMAL LOW (ref 50–90)
Neutrophil Count, Fluid: 29 % — ABNORMAL HIGH (ref 0–25)
Total Nucleated Cell Count, Fluid: 443 cu mm (ref 0–1000)

## 2016-03-26 LAB — GLUCOSE, CAPILLARY
GLUCOSE-CAPILLARY: 113 mg/dL — AB (ref 65–99)
GLUCOSE-CAPILLARY: 108 mg/dL — AB (ref 65–99)
Glucose-Capillary: 93 mg/dL (ref 65–99)
Glucose-Capillary: 121 mg/dL — ABNORMAL HIGH (ref 65–99)

## 2016-03-26 LAB — PROTEIN, BODY FLUID

## 2016-03-26 LAB — GRAM STAIN

## 2016-03-26 LAB — LACTATE DEHYDROGENASE, PLEURAL OR PERITONEAL FLUID: LD, Fluid: 149 U/L — ABNORMAL HIGH (ref 3–23)

## 2016-03-26 MED ORDER — LIDOCAINE HCL (PF) 1 % IJ SOLN
INTRAMUSCULAR | Status: AC
  Filled 2016-03-26: qty 10

## 2016-03-26 NOTE — Consult Note (Addendum)
   [facility] Inpatient Consult   03/26/2016  Jasmine [name] Jan 09, 1946 [id number]  Patient is active [prior to [place] with [place] [facility] for chronic disease management services. Admitted with HF exacerbation.  Met with the patient who states she is from [place] at [facility] facility.   Patient has been engaged by a [employer] and CSW.  Our community based plan of care has focused on disease management and community resource support. Consent already on file.  Patient will receive a post discharge transition of care call and will be evaluated for monthly home visits for assessments and disease process education.  Made Inpatient Case Manager aware that [place] [facility] following and she believes the patient is from the Assisted Living part.  Patient will likely discharge to a skilled nursing facility level of care.  . Of note, [facility] Care Management services does not replace or interfere with any services that are needed or arranged by inpatient case management or social work. Will update community for post hospital monitoring needs.   A brochure and contact information was given to the patient.  For additional questions or referrals please contact:  Natividad Brood, RN BSN [facility] Liaison  [phone] business mobile phone Toll free office [phone]

## 2016-03-26 NOTE — Progress Notes (Signed)
PROGRESS NOTE  Jasmine [name] [id number] DOB: Jan 05, 1946 DOA: 03/24/2016 PCP: [name], MD  HPI/Recap of past 24 hours: Jasmine [name] is a 70 y.o. female with a Past Medical History of depression, HTN, lumbar radiculopathy on the right/sciatic, chronic sinusitis, DVT, CHF, thyroid dysfunction, asthma who presents with acute on chronic respiratory failure likely secondary to mild CHF exacerbation and newly found bilateral lung masses with small pleural effusions. Diuresis, and CT abd/pelvis non-contrast without masses.  Assessment/Plan: Acute respiratory failure with hypoxia in a patient with COPD on O2 at home, and in the setting of Acute on chronic diastolic CHF, new diagnosis of Malignant lung masses, and bilateral pleural effusion last echocardiogram 12/28/2015 with vigorous systolic function EF 123456 to 70%. Grade 1 diastolic dysfunction  BNP 273. CXR small bilat pleural effusions. Initially on NRB due to Osats down to the 60s, now back on O2 .She was compliant with meds - Daily weights, strict I/O diuresis w Lasix 40 mg IV bid.  Continue home nebs, RT consulted   Chest pain, now resolved, cardiac versus musculoskeletal versus drug induced. Heart score 4-5 EKG Sinus rhythm, Borderline T abnormalities, inferior leads New since previous tracing. CT angio neg PE. Recent cocaine use. Chest pain is now resolved,  Tn less than 0.3 Serial Tn  UDS continue ASA, O2 and NTG as needed   New Bilateral lung masses CT angiogram of the chest negative for PE, but Mild right hilar lymphadenopathy and bilateral Spiculated opacities suspicious for malignancy  CT A/P without masses Thorcentesis today for attempt at tissue diagnosis. Onc consult to follow, likely as outpatient.   Abnormal LFTs H/o ETOH, No history of hepatitis per chart, Bili normal AST/ALT 204/216 CT abdomen and pelvis pending Check ETOH  Hepatitis panel Follow labs.  Hyperglycemia in the setting of ETOH  . Glu 193 Prediabetic.   Recent Labs    Lab Results  Component Value Date   HGBA1C 6.3* 12/29/2015    SSI Diabetic diet.   Anxiety/Depression Continue Xanax and Celexa   Substance abuse Will need Substance counseling  CIWA protocol       Code Status: FULL   Family Communication: None   Disposition Plan: Likely to home in 2-3 days.    Consultants:  None   Procedures:  Thoracentesis 6/6  CT abd/pelv 6/5   Antimicrobials:  None    Objective: Filed Vitals:   03/26/16 0848 03/26/16 1230 03/26/16 1440 03/26/16 1452  BP:  100/50 97/57 94/59   Pulse:  85    Temp:  97.6 F (36.4 C)    TempSrc:  Oral    Resp:  20    Height:      Weight:      SpO2: 89% 91%      Intake/Output Summary (Last 24 hours) at 03/26/16 1537 Last data filed at 03/26/16 0900  Gross per 24 hour  Intake    300 ml  Output    800 ml  Net   -500 ml   Filed Weights   03/24/16 1402 03/25/16 0444 03/26/16 0554  Weight: 48.898 kg (107 lb 12.8 oz) 47.854 kg (105 lb 8 oz) 47.446 kg (104 lb 9.6 oz)    Exam: General:  Alert, oriented,   in no acute distress Eyes: pupils round and reactive to light and accomodation, clear sclerea Neck: supple, no masses, trachea mildline  Cardiovascular: RRR, no murmurs or rubs, 2+ LE edema to knees Respiratory: clear to auscultation bilaterally, diminished at B bases, no wheezes Abdomen:  soft, nontender, nondistended, normal bowel tones heard  Skin: dry, no rashes  Musculoskeletal: no joint effusions, normal range of motion  Psychiatric: appropriate affect, normal speech  Neurologic: extraocular muscles intact, clear speech, moving all extremities with intact sensorium    Data Reviewed: CBC:  Recent Labs Lab 03/24/16 0910  WBC 8.9  NEUTROABS 6.5  HGB 12.9  HCT 42.3  MCV 95.9  PLT [id number]   Basic Metabolic Panel:  Recent Labs Lab 03/24/16 0910 03/25/16 0256  NA 137 137  K 5.5* 4.3  CL 89* 85*  CO2 37* 43*  GLUCOSE 197* 124*   BUN 32* 21*  CREATININE 1.18* 0.75  CALCIUM 9.3 9.0   GFR: Estimated Creatinine Clearance: 48.3 mL/min (by C-G formula based on Cr of 0.75). Liver Function Tests:  Recent Labs Lab 03/24/16 0910 03/25/16 0256  AST 204* 118*  ALT 216* 180*  ALKPHOS 84 74  BILITOT 1.0 0.8  PROT 6.4* 6.5  ALBUMIN 3.2* 3.0*   No results for input(s): LIPASE, AMYLASE in the last 168 hours. No results for input(s): AMMONIA in the last 168 hours. Coagulation Profile:  Recent Labs Lab 03/25/16 0856  INR 1.58*   Cardiac Enzymes:  Recent Labs Lab 03/24/16 1412 03/24/16 2109 03/25/16 0256  TROPONINI 0.09* 0.08* 0.06*   BNP (last 3 results) No results for input(s): PROBNP in the last 8760 hours. HbA1C: No results for input(s): HGBA1C in the last 72 hours. CBG:  Recent Labs Lab 03/25/16 1208 03/25/16 1635 03/25/16 2120 03/26/16 0551 03/26/16 1140  GLUCAP 98 118* 125* 93 113*   Lipid Profile: No results for input(s): CHOL, HDL, LDLCALC, TRIG, CHOLHDL, LDLDIRECT in the last 72 hours. Thyroid Function Tests: No results for input(s): TSH, T4TOTAL, FREET4, T3FREE, THYROIDAB in the last 72 hours. Anemia Panel: No results for input(s): VITAMINB12, FOLATE, FERRITIN, TIBC, IRON, RETICCTPCT in the last 72 hours. Urine analysis:    Component Value Date/Time   COLORURINE YELLOW 12/28/2015 0049   APPEARANCEUR CLOUDY* 12/28/2015 0049   LABSPEC 1.025 12/28/2015 0049   PHURINE 7.5 12/28/2015 0049   GLUCOSEU 100* 12/28/2015 0049   GLUCOSEU NEGATIVE 01/11/2015 1723   HGBUR NEGATIVE 12/28/2015 0049   BILIRUBINUR NEGATIVE 12/28/2015 0049   BILIRUBINUR negative 08/11/2011 1358   [place] 12/28/2015 0049   PROTEINUR 100* 12/28/2015 0049   PROTEINUR negative 08/11/2011 1358   UROBILINOGEN 0.2 01/11/2015 1723   UROBILINOGEN negative 08/11/2011 1358   NITRITE NEGATIVE 12/28/2015 0049   NITRITE negative 08/11/2011 1358   LEUKOCYTESUR SMALL* 12/28/2015 0049   Sepsis  Labs: @LABRCNTIP (procalcitonin:4,lacticidven:4)  ) Recent Results (from the past 240 hour(s))  MRSA PCR Screening     Status: None   Collection Time: 03/24/16  8:36 PM  Result Value Ref Range Status   MRSA by PCR NEGATIVE NEGATIVE Final    Comment:        The GeneXpert MRSA Assay (FDA approved for NASAL specimens only), is one component of a comprehensive MRSA colonization surveillance program. It is not intended to diagnose MRSA infection nor to guide or monitor treatment for MRSA infections.       Studies: Dg Chest 1 View  03/26/2016  CLINICAL DATA:  Followup pleural effusions. Status post thoracentesis. EXAM: CHEST 1 VIEW COMPARISON:  03/25/2016 FINDINGS: Cardiomegaly remains stable. Diffuse interstitial prominence is stable and suspicious for mild interstitial edema. Decreased size of left pleural effusion noted. Small right pleural effusion and right basilar atelectasis show no significant change. No pneumothorax visualized. Left upper lobe nodular density again  demonstrated, as better visualized on recent CT. IMPRESSION: No pneumothorax visualized. Decreased left pleural effusion. Persistent small right pleural effusion right basilar atelectasis. Stable cardiomegaly and mild diffuse interstitial edema pattern. Stable left upper lobe nodular density. See recent chest CT report of 03/24/2016. Electronically Signed   By: Earle Gell M.D.   On: 03/26/2016 15:25    Scheduled Meds: . ALPRAZolam  0.25 mg Oral BID  . aspirin EC  81 mg Oral Daily  . citalopram  10 mg Oral Daily  . fluticasone furoate-vilanterol  1 puff Inhalation Daily  . folic acid  1 mg Oral Daily  . folic acid  1 mg Oral Daily  . furosemide  40 mg Intravenous BID  . insulin aspart  0-9 Units Subcutaneous TID WC  . levothyroxine  75 mcg Oral QAC breakfast  . lidocaine (PF)      . multivitamin with minerals  1 tablet Oral Daily  . multivitamin with minerals  1 tablet Oral Daily  . sodium chloride flush  3 mL  Intravenous Q12H  . thiamine  100 mg Oral Daily  . tiotropium  18 mcg Inhalation Daily    Continuous Infusions:    LOS: 1 day   Time spent: 28 minutes  Velvet Bathe, MD [facility] Pager [phone]  If 7PM-7AM, please contact night-coverage [email or web address] Password [facility] 03/26/2016, 3:37 PM

## 2016-03-26 NOTE — Progress Notes (Signed)
Pt returned to bed from the chair, for transport to radiology.

## 2016-03-26 NOTE — Procedures (Signed)
   US guided Left thoracentesis  400 cc yellow fluid  Tolerated well Sent for labs per MD  cxr pending

## 2016-03-27 ENCOUNTER — Other Ambulatory Visit: Payer: Self-pay | Admitting: *Deleted

## 2016-03-27 ENCOUNTER — Encounter (HOSPITAL_COMMUNITY): Payer: Self-pay | Admitting: General Practice

## 2016-03-27 LAB — GLUCOSE, CAPILLARY
Glucose-Capillary: 155 mg/dL — ABNORMAL HIGH (ref 65–99)
Glucose-Capillary: 161 mg/dL — ABNORMAL HIGH (ref 65–99)
Glucose-Capillary: 88 mg/dL (ref 65–99)

## 2016-03-27 MED ORDER — ASPIRIN 81 MG PO TBEC: 81.0000 mg | DELAYED_RELEASE_TABLET | Freq: Every day | ORAL | Status: DC

## 2016-03-27 NOTE — Clinical Social Work Note (Addendum)
[facility] contacted. Admissions coordinator will review documentation over the hub and call CSW when transport can be called.  Dayton Scrape, [place] [phone]  2:45 pm CSW has attempted calling numerous family members to make them aware that patient is discharging today with no response. CSW left voicemail on Paula's (niece) voicemail.  Dayton Scrape, [place]

## 2016-03-27 NOTE — Discharge Summary (Signed)
Physician Discharge Summary  Jasmine [name] [id number] DOB: 05/22/1946 DOA: 03/24/2016  PCP: [name], MD  Admit date: 03/24/2016 Discharge date: 03/27/2016  Time spent: > 35 minutes  Recommendations for Outpatient Follow-up:  1. Please follow-up with cytology of recent thoracentesis 2. Abnormal LFT's please continue further work up as outpatient.   Discharge Diagnoses:  Active Problems:   Depression with anxiety   Essential hypertension   COPD (chronic obstructive pulmonary disease) (HCC)   Hypothyroidism   On home oxygen therapy   CHF (congestive heart failure) (HCC)   Chest pain   Lung mass   Abnormal LFTs   Elevated blood sugar   Acute on chronic respiratory failure with hypoxemia (HCC)   Bilateral pleural effusion   Discharge Condition: stable  Diet recommendation: Carb modified diet  Filed Weights   03/25/16 0444 03/26/16 0554 03/27/16 0458  Weight: 47.854 kg (105 lb 8 oz) 47.446 kg (104 lb 9.6 oz) 45.949 kg (101 lb 4.8 oz)    History of present illness:  Jasmine [name] is a 70 y.o. female with a Past Medical History of depression, HTN, lumbar radiculopathy on the right/sciatic, chronic sinusitis, DVT, CHF, thyroid dysfunction, asthma who presents with acute on chronic respiratory failure likely secondary to mild CHF exacerbation and newly found bilateral lung masses with small pleural effusions. Diuresis, and CT abd/pelvis non-contrast without masses.  Hospital Course:  Acute respiratory failure with hypoxia in a patient with COPD on O2 at home, and in the setting of Acute on chronic diastolic CHF, new diagnosis of Malignant lung masses, and bilateral pleural effusion last echocardiogram 12/28/2015 with vigorous systolic function EF 123456 to 70%. Grade 1 diastolic dysfunction  BNP 273. CXR small bilat pleural effusions. Initially on NRB due to Osats down to the 60s, now back on O2 . - Pt back at baseline, will continue prior to admission medication  regimen.   Chest pain, now resolved, cardiac versus musculoskeletal versus drug induced. Heart score 4-5 EKG Sinus rhythm, Borderline T abnormalities, inferior leads New since previous tracing. CT angio neg PE. Recent cocaine use. Chest pain is resolved,  Tn less than 0.3 Serial Tn mild elevation most likely secondary to demand ischemia UDS continue ASA, O2 and NTG as needed   New Bilateral lung masses CT angiogram of the chest negative for PE, but Mild right hilar lymphadenopathy and bilateral Spiculated opacities suspicious for malignancy  CT A/P without masses Thorcentesis  Performed.  Onc consult to follow, as outpatient.will have secretary set up appoint   Abnormal LFTs H/o ETOH, No history of hepatitis per chart, Bili normal AST/ALT 204/216 Recommend patient f/u with pcp for further evaluation and recommendations.  Hyperglycemia in the setting of ETOH . Glu 193 Prediabetic.   Recent Labs    Lab Results  Component Value Date   HGBA1C 6.3* 12/29/2015    Recommend diabetic diet  Anxiety/Depression Continue Xanax and Celexa  Substance abuse Recommended cessation           Procedures:  thoracentesis  Consultations:  None  Discharge Exam: Filed Vitals:   03/27/16 0458 03/27/16 1140  BP: 101/61 102/59  Pulse: 72 70  Temp: 98.7 F (37.1 C) 98.4 F (36.9 C)  Resp: 16 18    General: Pt in nad, alert and awake Cardiovascular: rrr, no rubs Respiratory: no increased wob, no wheezes  Discharge Instructions   Discharge Instructions    Call MD for:  difficulty breathing, headache or visual disturbances    Complete by:  As  directed      Call MD for:  temperature >100.4    Complete by:  As directed      Diet - low sodium heart healthy    Complete by:  As directed      Discharge instructions    Complete by:  As directed   Patient to follow up with oncologist for further evaluation and recommendations.     Increase activity  slowly    Complete by:  As directed           Current Discharge Medication List    START taking these medications   Details  aspirin EC 81 MG EC tablet Take 1 tablet (81 mg total) by mouth daily. Qty: 30 tablet, Refills: 0      CONTINUE these medications which have NOT CHANGED   Details  albuterol (PROVENTIL HFA;VENTOLIN HFA) 108 (90 Base) MCG/ACT inhaler Inhale 2 puffs into the lungs every 6 (six) hours as needed for wheezing or shortness of breath. Qty: 1 Inhaler, Refills: 3    albuterol (PROVENTIL) (2.5 MG/3ML) 0.083% nebulizer solution Take 3 mLs (2.5 mg total) by nebulization every 4 (four) hours as needed for wheezing. Qty: 75 mL, Refills: 12    ALPRAZolam (XANAX) 0.25 MG tablet Take 1 tablet (0.25 mg total) by mouth 2 (two) times daily. Qty: 60 tablet, Refills: 1    citalopram (CELEXA) 10 MG tablet Take 1 tablet (10 mg total) by mouth daily. Qty: 30 tablet, Refills: 3    feeding supplement, ENSURE ENLIVE, (ENSURE ENLIVE) LIQD Take 237 mLs by mouth 3 (three) times daily between meals. Qty: 237 mL, Refills: 12    fluticasone furoate-vilanterol (BREO ELLIPTA) 200-25 MCG/INH AEPB Inhale 1 puff into the lungs daily. Qty: 1 each, Refills: 5    furosemide (LASIX) 40 MG tablet Take 1 tablet (40 mg total) by mouth daily. Qty: 30 tablet, Refills: 3    levothyroxine (SYNTHROID, LEVOTHROID) 75 MCG tablet Take 1 tablet (75 mcg total) by mouth daily before breakfast. Qty: 30 tablet, Refills: 0    OXYGEN Inhale 4 L into the lungs continuous.     potassium chloride (K-DUR,KLOR-CON) 10 MEQ tablet Take 10 mEq by mouth 2 (two) times daily.    tiotropium (SPIRIVA HANDIHALER) 18 MCG inhalation capsule Place 1 capsule (18 mcg total) into inhaler and inhale daily. Qty: 30 capsule, Refills: 2       Allergies  Allergen Reactions  . Alendronate Sodium Other (See Comments)    Pt does not remember this reaction      The results of significant diagnostics from this hospitalization  (including imaging, microbiology, ancillary and laboratory) are listed below for reference.    Significant Diagnostic Studies: Ct Abdomen Pelvis Wo Contrast  03/24/2016  CLINICAL DATA:  Recent lung masses. EXAM: CT ABDOMEN AND PELVIS WITHOUT CONTRAST TECHNIQUE: Multidetector CT imaging of the abdomen and pelvis was performed following the standard protocol without IV contrast. COMPARISON:  None. FINDINGS: Moderate bilateral pleural effusions and underlying atelectasis are identified. No other abnormalities seen in the lung bases. No free air. No definite free fluid identified. Evaluation of parenchymal organs is limited due to lack of intravenous contrast. High attenuation is seen in the bilateral renal collecting systems, consistent with a contrast enhanced study earlier today. This limits evaluation for renal stones but none are seen. There is a cyst in the lower right kidney. No suspicious renal masses or hydronephrosis. High attenuation posteriorly in the gallbladder is suspicious for sludge or stones. The liver and  spleen are normal. The adrenal glands are unremarkable. Limited views of the pancreas demonstrate no abnormalities. There is atherosclerosis in the non aneurysmal abdominal aorta. No adenopathy is identified. The stomach and small bowel are unremarkable. Evaluation of colon is limited as the colon is largely not opacified with contrast. The appendix is not seen but there is no secondary evidence of appendicitis. There is increased attenuation in the subcutaneous fat in the fat of the mesentery, suggesting volume overload. No adenopathy or suspicious masses seen in the pelvis. The bladder is opacified with contrast with no filling defects. The visualized bones are unremarkable other than degenerative changes. No suspicious bony lesions. IMPRESSION: 1. Evaluation is limited without intravenous contrast. No suspicious masses are seen in the abdomen. 2. Bilateral pleural effusions and increased  attenuation in the subcutaneous and mesenteric fat consistent with volume overload. 3. Probable stones or sludge in the gallbladder. Electronically Signed   By: Dorise Bullion III M.D   On: 03/24/2016 19:06   Dg Chest 1 View  03/26/2016  CLINICAL DATA:  Followup pleural effusions. Status post thoracentesis. EXAM: CHEST 1 VIEW COMPARISON:  03/25/2016 FINDINGS: Cardiomegaly remains stable. Diffuse interstitial prominence is stable and suspicious for mild interstitial edema. Decreased size of left pleural effusion noted. Small right pleural effusion and right basilar atelectasis show no significant change. No pneumothorax visualized. Left upper lobe nodular density again demonstrated, as better visualized on recent CT. IMPRESSION: No pneumothorax visualized. Decreased left pleural effusion. Persistent small right pleural effusion right basilar atelectasis. Stable cardiomegaly and mild diffuse interstitial edema pattern. Stable left upper lobe nodular density. See recent chest CT report of 03/24/2016. Electronically Signed   By: Earle Gell M.D.   On: 03/26/2016 15:25   Dg Chest 2 View  03/25/2016  CLINICAL DATA:  Lung mass.  Shortness of breath. EXAM: CHEST  2 VIEW COMPARISON:  03/24/2016 chest radiograph and chest CT. FINDINGS: Stable cardiomediastinal silhouette with mild cardiomegaly. No pneumothorax. Stable small bilateral pleural effusions. Stable mild pulmonary edema. Small nodular opacities in the bilateral upper lungs correlate with the spiculated nodules described on the chest CT study from 1 day prior. Stable bibasilar atelectasis. IMPRESSION: 1. Stable mild congestive heart failure. 2. Stable small bilateral pleural effusions and bibasilar atelectasis. 3. Stable small nodular opacities in the bilateral upper lungs, correlating with the spiculated pulmonary nodules described on the chest CT study from 03/24/2016. Please refer to the chest CT report for follow-up recommendations. Electronically Signed    By: Ilona Sorrel M.D.   On: 03/25/2016 08:55   Ct Angio Chest Pe W/cm &/or Wo Cm  03/24/2016  CLINICAL DATA:  Pt breathing shallow. Hx COPD, CHF, PE. Denies pain at this time. Swelling noted to bilateral lower extremities. EXAM: CT ANGIOGRAPHY CHEST WITH CONTRAST TECHNIQUE: Multidetector CT imaging of the chest was performed using the standard protocol during bolus administration of intravenous contrast. Multiplanar CT image reconstructions and MIPs were obtained to evaluate the vascular anatomy. CONTRAST:  75 mL Isovue 370 COMPARISON:  11/11/2011 FINDINGS: Mediastinum/Lymph Nodes: No pulmonary emboli or thoracic aortic dissection identified. No mediastinal mass. Mild right hilar lymphadenopathy with the largest lymph node measuring 15 mm. Lungs/Pleura: Bilateral centrilobular emphysema. Small bilateral pleural effusions, right greater than left. Bilateral mild interstitial thickening. Spiculated 13 x 13 mm left upper lobe pulmonary opacity. Spiculated 9 x 12 mm right upper lobe pulmonary opacity. Bibasilar atelectasis. Upper abdomen: No acute findings. Musculoskeletal: No chest wall mass or suspicious bone lesions identified. Review of the MIP images  confirms the above findings. IMPRESSION: 1. No evidence pulmonary embolus. 2. Findings concerning for mild pulmonary edema. 3. Small bilateral pleural effusions, right greater than left. 4. Spiculated 13 x 13 mm left upper lobe pulmonary opacity. Spiculated 9 x 12 mm right upper lobe pulmonary opacity. These are new compared with 11/11/2011. These may reflect areas of airspace disease, but true pulmonary lesions cannot be excluded. Follow-up CT chest is recommended in 3 months. Electronically Signed   By: Kathreen Devoid   On: 03/24/2016 11:18   Dg Chest Port 1 View  03/24/2016  CLINICAL DATA:  Shortness of breath since Monday. EXAM: PORTABLE CHEST 1 VIEW COMPARISON:  12/27/2015 FINDINGS: Bilateral lower lobe airspace opacities are noted with small bilateral  effusions. Heart is normal size. No acute bony abnormality. IMPRESSION: Small bilateral pleural effusions with bibasilar atelectasis or infiltrates. Electronically Signed   By: Rolm Baptise M.D.   On: 03/24/2016 09:31   US Thoracentesis Asp Pleural Space W/img Guide  03/26/2016  INDICATION: Symptomatic Left sided pleural effusion EXAM: US THORACENTESIS ASP PLEURAL SPACE W/IMG GUIDE COMPARISON:  None. MEDICATIONS: 10 cc 1% lidocaine COMPLICATIONS: None immediate. TECHNIQUE: Informed written consent was obtained from the patient after a discussion of the risks, benefits and alternatives to treatment. A timeout was performed prior to the initiation of the procedure. Initial ultrasound scanning demonstrates a left pleural effusion. The lower chest was prepped and draped in the usual sterile fashion. 1% lidocaine was used for local anesthesia. Under direct ultrasound guidance, a 19 gauge, 7-cm, Yueh catheter was introduced. An ultrasound image was saved for documentation purposes. The thoracentesis was performed. The catheter was removed and a dressing was applied. The patient tolerated the procedure well without immediate post procedural complication. The patient was escorted to have an upright chest radiograph. FINDINGS: A total of approximately 400 cc of yellow fluid was removed. Requested samples were sent to the laboratory. IMPRESSION: Successful ultrasound-guided left sided thoracentesis yielding 400 cc of pleural fluid. Read by:  Lavonia Drafts [facility] Electronically Signed   By: Jerilynn Mages.  Shick M.D.   On: 03/26/2016 15:11    Microbiology: Recent Results (from the past 240 hour(s))  MRSA PCR Screening     Status: None   Collection Time: 03/24/16  8:36 PM  Result Value Ref Range Status   MRSA by PCR NEGATIVE NEGATIVE Final    Comment:        The GeneXpert MRSA Assay (FDA approved for NASAL specimens only), is one component of a comprehensive MRSA colonization surveillance program. It is not intended to  diagnose MRSA infection nor to guide or monitor treatment for MRSA infections.   Culture, body fluid-bottle     Status: None (Preliminary result)   Collection Time: 03/26/16  3:08 PM  Result Value Ref Range Status   Specimen Description PLEURAL LEFT  Final   Special Requests BOTTLES DRAWN AEROBIC AND ANAEROBIC 10CC  Final   Culture PENDING  Incomplete   Report Status PENDING  Incomplete  Gram stain     Status: None   Collection Time: 03/26/16  3:08 PM  Result Value Ref Range Status   Specimen Description PLEURAL LEFT  Final   Special Requests NONE  Final   Gram Stain   Final    CYTOSPIN SMEAR WBC PRESENT,BOTH PMN AND MONONUCLEAR NO ORGANISMS SEEN    Report Status 03/26/2016 FINAL  Final     Labs: Basic Metabolic Panel:  Recent Labs Lab 03/24/16 0910 03/25/16 0256  NA 137 137  K 5.5* 4.3  CL 89* 85*  CO2 37* 43*  GLUCOSE 197* 124*  BUN 32* 21*  CREATININE 1.18* 0.75  CALCIUM 9.3 9.0   Liver Function Tests:  Recent Labs Lab 03/24/16 0910 03/25/16 0256  AST 204* 118*  ALT 216* 180*  ALKPHOS 84 74  BILITOT 1.0 0.8  PROT 6.4* 6.5  ALBUMIN 3.2* 3.0*   No results for input(s): LIPASE, AMYLASE in the last 168 hours. No results for input(s): AMMONIA in the last 168 hours. CBC:  Recent Labs Lab 03/24/16 0910  WBC 8.9  NEUTROABS 6.5  HGB 12.9  HCT 42.3  MCV 95.9  PLT 235   Cardiac Enzymes:  Recent Labs Lab 03/24/16 1412 03/24/16 2109 03/25/16 0256  TROPONINI 0.09* 0.08* 0.06*   BNP: BNP (last 3 results)  Recent Labs  12/24/15 2000 03/24/16 0910  BNP 26.7 473.0*    ProBNP (last 3 results) No results for input(s): PROBNP in the last 8760 hours.  CBG:  Recent Labs Lab 03/26/16 1140 03/26/16 1642 03/26/16 2135 03/27/16 0614 03/27/16 1116  GLUCAP 113* 121* 108* 155* 161*    Signed:  Velvet Bathe MD.  [facility] 03/27/2016, 12:27 PM

## 2016-03-27 NOTE — Patient Outreach (Signed)
[facility]) Care Management  03/27/2016  Jasmine [name] [birth year] [id number]   Assessment: Care coordination Received an in basket message from hospital liaison Eliott Nine) that patient was admitted to the hospital. Noted from chart review that patient was admitted on 03/24/16 and for discharge today 03/27/16.   Plan: Will contact patient for Transition of care call tomorrow.  Gertrue Willette A. Marinus Eicher, BSN, RN-BC [place] Management Coordinator Cell: [phone]

## 2016-03-27 NOTE — Progress Notes (Addendum)
Orders received for pt discharge. IV removed and site remains clean, dry, intact.  Telemetry removed.  Pt in stable condition and awaiting transport.

## 2016-03-27 NOTE — Evaluation (Signed)
Physical Therapy Evaluation and Discharge Patient Details Name: Jasmine [name] MRN: [id number] DOB: 1945-11-13 Today's Date: 03/27/2016   History of Present Illness  70 y.o. female with a Past Medical History of depression, HTN, lumbar radiculopathy on the right/sciatic, chronic sinusitis, DVT, CHF, thyroid dysfunction, asthma who presents with acute on chronic respiratory failure likely secondary to mild CHF exacerbation and newly found bilateral lung masses with small pleural effusions.    Clinical Impression  Patient evaluated by Physical Therapy with no further acute PT needs identified. Patient is familiar with use of RW and demonstrated good balance with its use. All education has been completed and the patient has no further questions. Despite slow pace and 4L O2, pt destaturated to 80% with ambulating 35Ft. Standing rest break with incr to 6L O2 and pursed lip breathing with incr to 90% after 2 minutes. On return to room (35  Ft) on 6L desaturated to 86% (no talking and use of pursed lip breathing with appropriate velocity).  See below for any follow-up Physical Therapy or equipment needs. PT is signing off. Thank you for this referral.     Follow Up Recommendations No PT follow up;Supervision - Intermittent    Equipment Recommendations  None recommended by PT    Recommendations for Other Services OT consult (energy conservation)     Precautions / Restrictions Precautions Precautions: Other (comment) Precaution Comments: SaO2 decr      Mobility  Bed Mobility Overal bed mobility: Modified Independent             General bed mobility comments: patient has adjustable bed being delivered to ALF  Transfers Overall transfer level: Needs assistance Equipment used: Rolling walker (2 wheeled) Transfers: Sit to/from Stand Sit to Stand: Supervision         General transfer comment: vc for use of 2 wheel RW (pt is accustomed to 4 wheel); no  unsteadiness  Ambulation/Gait Ambulation/Gait assistance: Supervision Ambulation Distance (Feet): 35 Feet (standing rest; 35) Assistive device: Rolling walker (2 wheeled) Gait Pattern/deviations: Step-through pattern;Decreased stride length     General Gait Details: very slow to maintain breathing  Stairs            Wheelchair Mobility    Modified Rankin (Stroke Patients Only)       Balance Overall balance assessment: Modified Independent                                           Pertinent Vitals/Pain Pain Assessment: No/denies pain    Home Living Family/patient expects to be discharged to:: Assisted living Living Arrangements: Alone Available Help at Discharge: Other (Comment) (ALF staff) Type of Home: Assisted living Home Access: Level entry     Home Layout: One level Home Equipment: Walker - 4 wheels;Shower seat      Prior Function Level of Independence: Needs assistance   Gait / Transfers Assistance Needed: uses rollator to get to dining room; in room no device  ADL's / Homemaking Assistance Needed: does all her own ADLs; dining room or they bring meals to her; do her laundry (she manages her own medicines);   Comments: drives; uses 4L O2     Hand Dominance        Extremity/Trunk Assessment   Upper Extremity Assessment: Overall WFL for tasks assessed           Lower Extremity Assessment: Overall WFL for tasks  assessed      Cervical / Trunk Assessment: Normal  Communication   Communication: Other (comment) (speaks slowly; ? due to resp status)  Cognition Arousal/Alertness: Awake/alert Behavior During Therapy: WFL for tasks assessed/performed Overall Cognitive Status: Within Functional Limits for tasks assessed                      General Comments      Exercises Other Exercises Other Exercises: educated on pursed lip breathing with good return demonstration      Assessment/Plan    PT Assessment  Patent does not need any further PT services  PT Diagnosis Difficulty walking   PT Problem List    PT Treatment Interventions     PT Goals (Current goals can be found in the Care Plan section) Acute Rehab PT Goals PT Goal Formulation: All assessment and education complete, DC therapy    Frequency     Barriers to discharge        Co-evaluation               End of Session Equipment Utilized During Treatment: Gait belt;Oxygen Activity Tolerance: Treatment limited secondary to medical complications (Comment) (decr SaO2) Patient left: in chair;with call bell/phone within reach;with chair alarm set Nurse Communication: Mobility status;Other (comment) (decr SaO2)         Time: [id number] PT Time Calculation (min) (ACUTE ONLY): 37 min   Charges:   PT Evaluation $PT Eval Low Complexity: 1 Procedure PT Treatments $Therapeutic Exercise: 8-22 mins   PT G Codes:        Thang Flett 2016/04/01, 10:24 AM Pager [phone]

## 2016-03-27 NOTE — Clinical Social Work Note (Signed)
CSW facilitated patient discharge including contacting patient family (left voicemail) and facility to confirm patient discharge plans. Clinical information faxed to facility and family agreeable with plan. CSW arranged ambulance transport via [facility] to [facility]. RN to call report prior to discharge ([phone]).  CSW will sign off for now as social work intervention is no longer needed. Please consult [place] again if new needs arise.  Jasmine [name], Jasmine [name]

## 2016-03-28 ENCOUNTER — Other Ambulatory Visit: Payer: Self-pay | Admitting: *Deleted

## 2016-03-28 NOTE — Patient Outreach (Addendum)
[facility]) Care Management  03/28/2016  SOFYA LITTREL Jun 10, 1946 [id number]   Assessment: Transition of care - Initial call Call placed to speak with patient but unable to reach her. HIPAA compliant voice message left with name and contact number.  Call placed to patient's niece/ emergency contact Colletta [place]) and was able to speak to her. According to her niece, Ms. Cambra no longer lives with her because she already moved to [place]. Niece gave patient's mobile phone number to contact her. Niece was made aware that care management coordinator had already contacted patient through her mobile number and left a message to call back.  Plan: Will await for patient to call back. If unable to receive a return call, will try to call patient again next week.   Kloie Whiting A. Bowie Delia, BSN, RN-BC [place] Management Coordinator Cell: [phone]

## 2016-03-31 ENCOUNTER — Other Ambulatory Visit: Payer: Self-pay | Admitting: *Deleted

## 2016-03-31 DIAGNOSIS — F321 Major depressive disorder, single episode, moderate: Secondary | ICD-10-CM | POA: Diagnosis not present

## 2016-03-31 DIAGNOSIS — F419 Anxiety disorder, unspecified: Secondary | ICD-10-CM | POA: Diagnosis not present

## 2016-03-31 LAB — CULTURE, BODY FLUID-BOTTLE

## 2016-03-31 LAB — CULTURE, BODY FLUID W GRAM STAIN -BOTTLE: Culture: NO GROWTH

## 2016-03-31 NOTE — Patient Outreach (Signed)
[facility]) Care Management  03/31/2016  Jasmine [name] 09-Nov-1945 [id number]   Assessment: Transition of care (week 1) -  Initial call follow-up  (2nd attempt) Unable to receive a return call to message left on patient's voicemail last Friday 6/9. Call placed again to speak with patient but unable to reach her. HIPAA compliant voice message left with name and contact information.   Plan: Will await for patient to call back. If unable to receive a return call, will try to call patient again tomorrow (6/13) to follow-up transition of care.   Glendell Schlottman A. Makalynn Berwanger, BSN, RN-BC [place] Management Coordinator Cell: [phone]

## 2016-04-01 ENCOUNTER — Other Ambulatory Visit: Payer: Self-pay | Admitting: *Deleted

## 2016-04-01 ENCOUNTER — Encounter: Payer: Self-pay | Admitting: *Deleted

## 2016-04-01 NOTE — Patient Outreach (Signed)
[facility]) Care Management  04/01/2016  Jasmine [name] 1946/09/07 [id number]   Assessment: Transition of Care call - week 1 (3rd attempt)  Called and spoke with patient and states she is "doing pretty good" and is "now living in a good facility" ([facility] [place]). Patient verbalized understanding of hospital stay (recent hospital admit 6/5 - 03/27/16 for HF, malignant lung masses and COPD). Patient informed care management coordinator that Heart Failure has the most impact to her health at present. Although she denies being short of breath more than usual, no chest heaviness, cough or swelling, she reports still feeling some discomfort to her back. She wears oxygen continuously.  She notified care management coordinator that [facility] facility provides her meals 3 times a day, manages her medications and is being dispensed to her, provides transportation to doctors' appointments and provides housekeeping management.  Care coordination made with [facility] nurse Edwena Blow) since discharge instructions is with facility. Medications reviewed. Discussed with her regarding need to monitor patient's weight daily and record, patient's diet and need to set-up oncologist appointment for follow-up. She mentioned that a follow-up appointment with patient's primary care provider is scheduled on 04/11/16 and their transportation staff will arrange the appointment for oncologist.  Transition of care call completed with no urgent/ immediate needs identified by patient at this time. Patient agreed to home visit next week.  Patient is aware to call [facility] management coordinator or 24-hour nurse call line as needed. Patient has contact information.   Plan: Will do home visit on 04/10/16. Will provide and discuss [facility] HF packet with patient.   Sharri Loya A. Hong Timm, BSN, RN-BC [place] Management Coordinator Cell: [phone]

## 2016-04-02 ENCOUNTER — Telehealth: Payer: Self-pay

## 2016-04-02 NOTE — Telephone Encounter (Signed)
FL2 for [employer] completed and placed on MD's desk for signature

## 2016-04-03 NOTE — Telephone Encounter (Signed)
Paperwork completed, signed, copy sent to scan.  Original left in cabinet for Jasmine [name] with [name] to pick up, message left on her VM regarding same.

## 2016-04-07 ENCOUNTER — Other Ambulatory Visit: Payer: Commercial Managed Care - HMO

## 2016-04-07 ENCOUNTER — Other Ambulatory Visit: Payer: Self-pay | Admitting: Medical Oncology

## 2016-04-07 ENCOUNTER — Telehealth: Payer: Self-pay | Admitting: Internal Medicine

## 2016-04-07 ENCOUNTER — Ambulatory Visit: Payer: Commercial Managed Care - HMO | Admitting: Internal Medicine

## 2016-04-07 DIAGNOSIS — F320 Major depressive disorder, single episode, mild: Secondary | ICD-10-CM | POA: Diagnosis not present

## 2016-04-07 DIAGNOSIS — F411 Generalized anxiety disorder: Secondary | ICD-10-CM | POA: Diagnosis not present

## 2016-04-07 DIAGNOSIS — F419 Anxiety disorder, unspecified: Secondary | ICD-10-CM | POA: Diagnosis not present

## 2016-04-07 DIAGNOSIS — F321 Major depressive disorder, single episode, moderate: Secondary | ICD-10-CM | POA: Diagnosis not present

## 2016-04-07 NOTE — Telephone Encounter (Signed)
Error

## 2016-04-09 ENCOUNTER — Encounter: Payer: Self-pay | Admitting: Internal Medicine

## 2016-04-09 ENCOUNTER — Other Ambulatory Visit (INDEPENDENT_AMBULATORY_CARE_PROVIDER_SITE_OTHER): Payer: Commercial Managed Care - HMO

## 2016-04-09 ENCOUNTER — Ambulatory Visit (INDEPENDENT_AMBULATORY_CARE_PROVIDER_SITE_OTHER): Payer: Commercial Managed Care - HMO | Admitting: Internal Medicine

## 2016-04-09 VITALS — BP 112/64 | HR 74 | Temp 98.9°F | Resp 20 | Wt 109.0 lb

## 2016-04-09 DIAGNOSIS — R945 Abnormal results of liver function studies: Secondary | ICD-10-CM

## 2016-04-09 DIAGNOSIS — I5032 Chronic diastolic (congestive) heart failure: Secondary | ICD-10-CM

## 2016-04-09 DIAGNOSIS — R7989 Other specified abnormal findings of blood chemistry: Secondary | ICD-10-CM | POA: Diagnosis not present

## 2016-04-09 DIAGNOSIS — Z0001 Encounter for general adult medical examination with abnormal findings: Secondary | ICD-10-CM | POA: Diagnosis not present

## 2016-04-09 DIAGNOSIS — J438 Other emphysema: Secondary | ICD-10-CM | POA: Diagnosis not present

## 2016-04-09 DIAGNOSIS — R918 Other nonspecific abnormal finding of lung field: Secondary | ICD-10-CM

## 2016-04-09 DIAGNOSIS — R6889 Other general symptoms and signs: Secondary | ICD-10-CM

## 2016-04-09 DIAGNOSIS — J9611 Chronic respiratory failure with hypoxia: Secondary | ICD-10-CM

## 2016-04-09 LAB — HEPATIC FUNCTION PANEL
ALK PHOS: 60 U/L (ref 39–117)
ALT: 20 U/L (ref 0–35)
AST: 20 U/L (ref 0–37)
Albumin: 3.8 g/dL (ref 3.5–5.2)
BILIRUBIN TOTAL: 0.4 mg/dL (ref 0.2–1.2)
Bilirubin, Direct: 0.1 mg/dL (ref 0.0–0.3)
Total Protein: 7.4 g/dL (ref 6.0–8.3)

## 2016-04-09 NOTE — Patient Instructions (Signed)
Please continue all other medications as before, and refills have been done if requested.  Please have the pharmacy call with any other refills you may need.  Please continue your efforts at being more active, low cholesterol diet, and weight control.  You are otherwise up to date with prevention measures today.  Please keep your appointments with your specialists as you may have planned  Please go to the LAB in the Basement (turn left off the elevator) for the tests to be done today  You will be contacted by phone if any changes need to be made immediately.  Otherwise, you will receive a letter about your results with an explanation, but please check with MyChart first.  Please remember to sign up for MyChart if you have not done so, as this will be important to you in the future with finding out test results, communicating by private email, and scheduling acute appointments online when needed.  Please return in 3 months, or sooner if needed 

## 2016-04-09 NOTE — Assessment & Plan Note (Signed)
Etiology unclear, unlikely currently related to ETOH use, recent acute hep panel neg, for repeat lab today, consider GI referral

## 2016-04-09 NOTE — Assessment & Plan Note (Signed)
stable overall by history and exam, recent data reviewed with pt, and pt to continue medical treatment as before,  to f/u any worsening symptoms or concerns SpO2 Readings from Last 3 Encounters:  04/09/16 93%  03/27/16 98%  03/11/16 93%

## 2016-04-09 NOTE — Progress Notes (Signed)
Subjective:    Patient ID: Jasmine [name], female    DOB: October 27, 1945, 70 y.o.   MRN: [id number]  HPI  .Here for wellness and f/u;  Overall doing ok;  Pt denies Chest pain, worsening SOB, DOE, wheezing, orthopnea, PND,  palpitations, dizziness or syncope.  Pt denies neurological change such as new headache, facial or extremity weakness.  Pt denies polydipsia, polyuria, or low sugar symptoms. Pt states overall good compliance with treatment and medications, good tolerability, and has been trying to follow appropriate diet.  Pt denies worsening depressive symptoms, suicidal ideation or panic. No fever, night sweats, , loss of appetite, or other constitutional symptoms.  Pt states good ability with ADL's, has low fall risk, home safety reviewed and adequate, no other significant changes in hearing or vision, and only occasionally active with exercise. Due for colonoscopy but pt delcines for now.  Wt Readings from Last 3 Encounters:  04/09/16 109 lb (49.442 kg)  03/27/16 101 lb 4.8 oz (45.949 kg)  01/16/16 95 lb (43.092 kg)  Wt has been increased: Was recently hospd for acute on chr resp failure hypoxic, acute on chr diast chf, elevated LFT's and new finding lung masses.  Did have recent left thoracentisis for bilat pleural effusion, with cytlogy neg for malignancy, but is to f/u with Dr mohammed/0ncology for lung malignancy June 30.  Tolerated tx well, f/u lft's somewhat improved over admit. Denies worsening reflux, abd pain, dysphagia, n/v, bowel change or blood.  Acute hep panel neg for acute  abd u/s june 5 with GB stone or sludge noted, as well as the blat pleural effusions.  Pt denies more than 1-2 beers per month at most  Pt denies chest pain, increased sob or doe, wheezing, orthopnea, PND, increased LE swelling, palpitations, dizziness or syncope, though still has some persistent very mild distal LE swelling.  Pt denies new neurological symptoms such as new headache, or facial or extremity weakness  or numbness   Pt denies polydipsia, polyuria, or low sugar symptoms such as weakness or confusion improved with po intake.  Pt states overall good compliance with meds,  And incidentally Denies urinary symptoms such as dysuria, frequency, urgency, flank pain, hematuria or n/v, fever, chills. Past Medical History  Diagnosis Date  . ALLERGIC RHINITIS 05/01/2007  . DEPRESSION 07/21/2008  . FATIGUE 10/10/2010  . HYPERTENSION 05/01/2007  . LUMBAR RADICULOPATHY, RIGHT 08/25/2008  . OSTEOPOROSIS 05/01/2007  . SHOULDER PAIN, LEFT 08/23/2009  . SINUSITIS, CHRONIC 10/10/2010  . SINUSITIS- ACUTE-NOS 10/10/2008  . DVT (deep venous thrombosis) (HCC) 1960s    LLE  . CHF (congestive heart failure) ([place])   . HYPERTHYROIDISM 11/23/2007    Pt endorses having had Graves disease, possibly radioactive iodine x1, but no thyroidectomy  . HYPOTHYROIDISM 08/23/2009  . ASTHMA 05/01/2007    "since I was a child"  . Chronic bronchitis ([place])   . Arthritis     "hands" (12/28/2015)  . Anxiety   . On home oxygen therapy     "3-4L; qd; all the time" (12/28/2015)   Past Surgical History  Procedure Laterality Date  . Appendectomy    . Ectopic pregnancy surgery  "late '60s or early '70s  . Oophorectomy Right   . Electrocardiogram  06/20/2006  . Dilation and curettage of uterus  multiple    history of multiple dialations and curettages and miscarriages, unfortunately never carrying a child to term    reports that she quit smoking about 8 months ago. Her smoking use included Cigarettes.  She has a 54 pack-year smoking history. She has never used smokeless tobacco. She reports that she drinks alcohol. She reports that she uses illicit drugs ("Crack" cocaine) about twice per week. family history includes Alcohol abuse in her brother; Diabetes in her brother; Hypertension in her other; Lung cancer in her father; Stroke in her mother; Thyroid disease in her mother. Allergies  Allergen Reactions  . Alendronate Sodium Other (See  Comments)    Pt does not remember this reaction   Current Outpatient Prescriptions on File Prior to Visit  Medication Sig Dispense Refill  . albuterol (PROVENTIL HFA;VENTOLIN HFA) 108 (90 Base) MCG/ACT inhaler Inhale 2 puffs into the lungs every 6 (six) hours as needed for wheezing or shortness of breath. 1 Inhaler 3  . albuterol (PROVENTIL) (2.5 MG/3ML) 0.083% nebulizer solution Take 3 mLs (2.5 mg total) by nebulization every 4 (four) hours as needed for wheezing. 75 mL 12  . ALPRAZolam (XANAX) 0.25 MG tablet Take 1 tablet (0.25 mg total) by mouth 2 (two) times daily. 60 tablet 1  . aspirin EC 81 MG EC tablet Take 1 tablet (81 mg total) by mouth daily. 30 tablet 0  . citalopram (CELEXA) 10 MG tablet Take 1 tablet (10 mg total) by mouth daily. 30 tablet 3  . feeding supplement, ENSURE ENLIVE, (ENSURE ENLIVE) LIQD Take 237 mLs by mouth 3 (three) times daily between meals. 237 mL 12  . fluticasone furoate-vilanterol (BREO ELLIPTA) 200-25 MCG/INH AEPB Inhale 1 puff into the lungs daily. 1 each 5  . furosemide (LASIX) 40 MG tablet Take 1 tablet (40 mg total) by mouth daily. 30 tablet 3  . levothyroxine (SYNTHROID, LEVOTHROID) 75 MCG tablet Take 1 tablet (75 mcg total) by mouth daily before breakfast. 30 tablet 0  . OXYGEN Inhale 4 L into the lungs continuous.     . potassium chloride (K-DUR,KLOR-CON) 10 MEQ tablet Take 10 mEq by mouth 2 (two) times daily.    [name] tiotropium (SPIRIVA HANDIHALER) 18 MCG inhalation capsule Place 1 capsule (18 mcg total) into inhaler and inhale daily. 30 capsule 2   No current facility-administered medications on file prior to visit.   Review of Systems Constitutional: Negative for increased diaphoresis, or other activity, appetite or siginficant weight change other than noted HENT: Negative for worsening hearing loss, ear pain, facial swelling, mouth sores and neck stiffness.   Eyes: Negative for other worsening pain, redness or visual disturbance.  Respiratory:  Negative for choking or stridor Cardiovascular: Negative for other chest pain and palpitations.  Gastrointestinal: Negative for worsening diarrhea, blood in stool, or abdominal distention Genitourinary: Negative for hematuria, flank pain or change in urine volume.  Musculoskeletal: Negative for myalgias or other joint complaints.  Skin: Negative for other color change and wound or drainage.  Neurological: Negative for syncope and numbness. other than noted Hematological: Negative for adenopathy. or other swelling Psychiatric/Behavioral: Negative for hallucinations, SI, self-injury, decreased concentration or other worsening agitation.      Objective:   Physical Exam BP 112/64 mmHg  Pulse 74  Temp(Src) 98.9 F (37.2 C) (Oral)  Resp 20  Wt 109 lb (49.442 kg)  SpO2 93% VS noted, frail, on home o2, in NAD with walker Constitutional: Pt is oriented to person, place, and time. Appears well-developed and well-nourished, in no significant distress Head: Normocephalic and atraumatic  Eyes: Conjunctivae and EOM are normal. Pupils are equal, round, and reactive to light Right Ear: External ear normal.  Left Ear: External ear normal Nose: Nose normal.  Mouth/Throat: Oropharynx is clear and moist  Neck: Normal range of motion. Neck supple. No JVD present. No tracheal deviation present or significant neck LA or mass Cardiovascular: Normal rate, regular rhythm, normal heart sounds and intact distal pulses.   Pulmonary/Chest: Effort normal and breath sounds decreased without rales or wheezing  Abdominal: Soft. Bowel sounds are normal. NT. No HSM  Musculoskeletal: Normal range of motion. Exhibits no edema Lymphadenopathy: Has no cervical adenopathy.  Neurological: Pt is alert and oriented to person, place, and time. Pt has normal reflexes. No cranial nerve deficit. Motor grossly intact Skin: Skin is warm and dry. No rash noted or new ulcers Psychiatric:  Has pleasant mood and affect. Behavior is  normal.   Lab Results  Component Value Date   WBC 8.9 03/24/2016   HGB 12.9 03/24/2016   HCT 42.3 03/24/2016   PLT 235 03/24/2016   GLUCOSE 124* 03/25/2016   CHOL 184 01/11/2015   TRIG 60.0 01/11/2015   HDL 84.30 01/11/2015   LDLDIRECT 102.1 10/10/2010   LDLCALC 88 01/11/2015   ALT 180* 03/25/2016   AST 118* 03/25/2016   NA 137 03/25/2016   K 4.3 03/25/2016   CL 85* 03/25/2016   CREATININE 0.75 03/25/2016   BUN 21* 03/25/2016   CO2 43* 03/25/2016   TSH 6.076* 12/30/2015   INR 1.58* 03/25/2016   HGBA1C 6.3* 12/29/2015   Most recent CT:abd /pelvis w/o CM IMPRESSION: 1. Evaluation is limited without intravenous contrast. No suspicious masses are seen in the abdomen. 2. Bilateral pleural effusions and increased attenuation in the subcutaneous and mesenteric fat consistent with volume overload. 3. Probable stones or sludge in the gallbladder.   Electronically Signed  By: Dorise Bullion III M.D  On: 03/24/2016 19:06    Assessment & Plan:

## 2016-04-09 NOTE — Assessment & Plan Note (Signed)
Concerning for malignancy, thoracentesis neg, for f/u oncology as planned

## 2016-04-09 NOTE — Assessment & Plan Note (Signed)
stable overall by history and exam, recent data reviewed with pt, and pt to continue medical treatment as before,  to f/u any worsening symptoms or concerns Lab Results  Component Value Date   CREATININE 0.75 03/25/2016

## 2016-04-09 NOTE — Assessment & Plan Note (Signed)

## 2016-04-09 NOTE — Progress Notes (Signed)
Pre visit review using our clinic review tool, if applicable. No additional management support is needed unless otherwise documented below in the visit note. 

## 2016-04-09 NOTE — Assessment & Plan Note (Signed)
stable overall by history and exam, last cxr reviewed with pt,, and pt to continue medical treatment as before,  to f/u any worsening symptoms or concerns

## 2016-04-10 ENCOUNTER — Other Ambulatory Visit: Payer: Self-pay | Admitting: *Deleted

## 2016-04-10 ENCOUNTER — Encounter: Payer: Self-pay | Admitting: *Deleted

## 2016-04-10 NOTE — Patient Outreach (Signed)
[place] [facility]) Care Management   04/10/2016  Jasmine [name] [birth year] [id number]  Jasmine [name] is an 70 y.o. female  Subjective: Patient verbalizes "feeling good" and "doing much better living by myself because I have my own room and I can lock and unlock my door".  Patient denies chest pain/ discomfort, worsening shortness of breath on exertion, cough/ phlegm , dizziness or syncope, change in appetite.  Objective:  BP 100/60 mmHg  Pulse 68  Resp 16  SpO2 92%  Review of Systems  Constitutional: Negative.   HENT: Positive for hearing loss. Negative for congestion.        Slight hearing loss- does not feel need for hearing aid   Eyes: Negative.        Wears eyeglasses for distance/ driving  Respiratory: Negative.  Negative for cough, sputum production and wheezing.        Shortness of breath not more than usual Lung sounds diminished otherwise clear on auscultation Respirations even and unlabored Continuous home oxygen use per nasal cannula  Cardiovascular: Positive for leg swelling. Negative for chest pain.       Regular rate and rhythm Persistent pitting edema to bilateral lower extremities left greater than right  Gastrointestinal: Negative.  Negative for nausea, vomiting, abdominal pain, diarrhea and constipation.       Bowel sounds hypoactive Abdomen soft, non-tender  Genitourinary: Positive for frequency.       Frequency related to Lasix use Minimal urine leakage-  wears liner for protection  Musculoskeletal: Negative.  Negative for falls.  Skin: Negative.   Neurological: Negative.   Endo/Heme/Allergies: Negative.   Psychiatric/Behavioral: Positive for depression.       States coping well with husband's loss On Xanax and Celexa    Physical Exam  Encounter Medications:   Outpatient Encounter Prescriptions as of 04/10/2016  Medication Sig  . albuterol (PROVENTIL HFA;VENTOLIN HFA) 108 (90 Base) MCG/ACT inhaler Inhale 2 puffs into the lungs every  6 (six) hours as needed for wheezing or shortness of breath.  . ALPRAZolam (XANAX) 0.25 MG tablet Take 1 tablet (0.25 mg total) by mouth 2 (two) times daily.  [name] aspirin EC 81 MG EC tablet Take 1 tablet (81 mg total) by mouth daily.  . citalopram (CELEXA) 10 MG tablet Take 1 tablet (10 mg total) by mouth daily.  . feeding supplement, ENSURE ENLIVE, (ENSURE ENLIVE) LIQD Take 237 mLs by mouth 3 (three) times daily between meals.  . fluticasone furoate-vilanterol (BREO ELLIPTA) 200-25 MCG/INH AEPB Inhale 1 puff into the lungs daily.  . furosemide (LASIX) 40 MG tablet Take 1 tablet (40 mg total) by mouth daily.  [name] levothyroxine (SYNTHROID, LEVOTHROID) 75 MCG tablet Take 1 tablet (75 mcg total) by mouth daily before breakfast.  . OXYGEN Inhale 4 L into the lungs continuous.   . potassium chloride (K-DUR,KLOR-CON) 10 MEQ tablet Take 10 mEq by mouth 2 (two) times daily.  [name] tiotropium (SPIRIVA HANDIHALER) 18 MCG inhalation capsule Place 1 capsule (18 mcg total) into inhaler and inhale daily.  [name] albuterol (PROVENTIL) (2.5 MG/3ML) 0.083% nebulizer solution Take 3 mLs (2.5 mg total) by nebulization every 4 (four) hours as needed for wheezing. (Patient not taking: Reported on 04/10/2016)   No facility-administered encounter medications on file as of 04/10/2016.    Functional Status:   In your present state of health, do you have any difficulty performing the following activities: 04/02/2016 04/01/2016  Hearing? Y Y  Vision? - N  Difficulty concentrating or making decisions? -  N  Walking or climbing stairs? Y Y  Dressing or bathing? - N  Doing errands, shopping? - N  Conservation officer, nature and eating ? - N  Using the Toilet? - N  In the past six months, have you accidently leaked urine? - Y  Do you have problems with loss of bowel control? - N  Managing your Medications? - Y  Managing your Finances? - N  Housekeeping or managing your Housekeeping? - Y    Fall/Depression Screening:    PHQ 2/9 Scores 04/01/2016  03/11/2016 02/13/2016 02/11/2016 01/09/2016 07/11/2013  PHQ - 2 Score _0 0 1 3  PHQ- 9 Score - - - - - 4    Assessment:   Arrived at patient's apartment at [facility] facility where she is now living alone. She has 24/7 care from [facility] staff as stated. Frail female, on continuous oxygen use per nasal cannula. She had recent hospitalization related to heart failure and new findings of lung masses. She ambulates using walker for distance and uses small portable oxygen when goes outside.  She states she had been "doing well and adjusting well to this new place".  Patient shares being seen by primary care provider yesterday- 6/21 for post hospital follow-up. Patient reports she had a good visit with [name] with recommendation to keep her follow-up appointment on 6/30 with oncologist (Dr. Earlie Server) for lung malignancy. [facility] will provide transportation to doctors' appointment.  She states good compliance with treatment and medications being dispensed by [facility] staff.  Patient denies any changes in energy, appetite and sleep. She reports had been trying to follow appropriate diet and reading food labels. Her swelling to bilateral lower extremities remain and she continues on Lasix. Encouraged patient to elevate legs at regular intervals and even when in bed to help decrease edema. Patient's weight on 6/15 was 106.6 pounds and today is 106.2 pounds. Care coordination made with [facility] staff [facility]) in regards to monitoring patient's weight daily and recording.  Staff states she will fax primary care provider's office for actual order then will continue to monitor patient's weight. Patient and staff were made aware to monitor for weight gain of 3 pounds overnight and 5 pounds in a week and notify provider of such. [facility] HF packet provided to patient and discussed steps in managing heart failure. Encouraged patient to use [facility] notebook/ calendar to record weight readings.   Patient considers herself being in the "green zone" and had expressed understanding to notify [facility] nursing staff to call provider if moving to yellow zone and to call 911 if in the red zone.   Patient informed care management coordinator that plan to change her healthcare power of attorney to her other niece Jasmine [name]) and to have her final wishes notarized is still on the works.  Reminded patient again to allot time for these matters to be done and she agreed to it.    Patient denies any other needs or concerns at this time. She agreed to transition of  care call next week. She is aware to call [facility] care management when needed. Contact information with patient.     Plan: Will continue weekly transition of care with a call on 04/16/16 as agreed by patient.   [facility] CM Care Plan Problem One        Most Recent Value   Care Plan Problem One  at risk for redamission   Role Documenting the Problem One  Care Management Coordinator   Care  Plan for Problem One  Active   [facility] Long Term Goal (31-90 days)  patient will not be readmitted for congestive heart failure in the next 31 days   [facility] Long Term Goal Start Date  04/10/16   Interventions for Problem One Long Term Goal  review medications and encourage to adhere to medications as dispensed by [facility] staff,  encourage to attend scheduled providers' appointments,  provide [facility] HF packet and discuss,  advise patient to know how she feels daily using the HF zone tool (magnet) and contact provider for help,  encourage to do walking exercise as tolerated inorder to build endurance,  monitor weight daily and record- notify provider for weight gain of 3 pounds in a day and 5 pounds in a week, report shortness of breath more than usual, dizziness, dry cough, chest discomfort & change in appetite,  provide Food list/ guide for high sodium food to avoid and low salt food to take- to adhere to diet restriction     [facility] CM Short Term Goal #1 (0-30 days)   patient will attend follow-up appointment with oncologist (per PCP recommendation) in the next 30 days   [facility] CM Short Term Goal #1 Start Date  04/10/16   Interventions for Short Term Goal #1  verify appointment schedule with oncologist (Dr. Julien Nordmann),  encourage to keep appointment and discuss importance of attending it,  confirm availability of transportation to appointment    [facility] CM Care Plan Problem Two        Most Recent Value   Care Plan Problem Two  Lack of knowledge regarding heart failure management   Role Documenting the Problem Two  Care Management Coordinator   Care Plan for Problem Two  Active   Interventions for Problem Two Long Term Goal   provide [facility] HF packet and discuss,  encourage to ask questions if any,  provide pamphlet and explain the 5 simple steps in managing heart failure or living life better with HF,  provide HF (magnet) zone tool and explain usage like: identifying how she feels daily and when to call the doctor for help,  explain proper way of weighing self daily and record,  encourage taking medication as dispensed (adherence to diuretics),  advise to continue exercise as tolerated and following diet restrictions as provided on food guide for low sodium food and high sodium food to avoid, as well as to read food labels     [facility] Long Term Goal (31-90) days  patient will verbalize at least 3 simple steps in managing Heart failure within the next 31 days   [facility] Long Term Goal Start Date  04/10/16   [facility] CM Short Term Goal #1 (0-30 days)  patient will record daily weights in the next 30 days   [facility] CM Short Term Goal #1 Start Date  04/10/16   Interventions for Short Term Goal #2   explain to patient the importance of monitoring weight daily and recording on [facility] notebook/ calendar,  discuss proper way of checking daily weights,  emphasize reporting to provider any weight gain 3 pounds in a day and 5 pounds in a week,  coordinate with [facility] nursing staff to assist patient in  monitoring daily weight and documenting   [facility] CM Short Term Goal #2 (0-30 days)  patient will identify the heart failure zones and when to call the doctor for help in the next 30 days   [facility] CM Short Term Goal #2 Start Date  04/10/16  Interventions for Short Term Goal #2  provide and discuss [facility] HF packet,  explain the different HF zones with the signs and symptoms under each zone and when to call provider for help,  provide HF magnet for easier access,  review patient's understanding using teach back method    [facility] CM Short Term Goal #3 (0-30 days)  patient will verbalize at least 3 signs and symptoms of COPD flare-up and know when to call the doctor in the next 30 days   [facility] CM Short Term Goal #3 Start Date  03/11/16   [facility] CM Short Term Goal #3 Met Date  04/10/16 [Goal met.]   [facility] CM Short Term Goal #4 (0-30 days)  patient will report obtaining smaller, lighter portable oxygen concentrator to use outside the house in the next 30 days   [facility] CM Short Term Goal #4 Start Date  03/11/16   [facility] CM Short Term Goal #4 Met Date  04/10/16 [Goal met-pt.has a whole cart of small portable concentrator]     Skyleen Bentley A. Susy Placzek, BSN, RN-BC [place] Management Coordinator Cell: [phone]

## 2016-04-11 ENCOUNTER — Encounter: Payer: Self-pay | Admitting: *Deleted

## 2016-04-14 DIAGNOSIS — F419 Anxiety disorder, unspecified: Secondary | ICD-10-CM | POA: Diagnosis not present

## 2016-04-14 DIAGNOSIS — F321 Major depressive disorder, single episode, moderate: Secondary | ICD-10-CM | POA: Diagnosis not present

## 2016-04-16 ENCOUNTER — Other Ambulatory Visit: Payer: Self-pay | Admitting: Internal Medicine

## 2016-04-16 ENCOUNTER — Other Ambulatory Visit: Payer: Self-pay | Admitting: *Deleted

## 2016-04-16 DIAGNOSIS — R918 Other nonspecific abnormal finding of lung field: Secondary | ICD-10-CM

## 2016-04-16 DIAGNOSIS — J90 Pleural effusion, not elsewhere classified: Secondary | ICD-10-CM

## 2016-04-16 NOTE — Patient Outreach (Signed)
[place] [facility]) Care Management  04/16/2016  Jasmine [name] 11/25/1945 [id number]   Assessment: Transition of Care call - week 3 Call placed and spoke with patient stating that she is "doing very good". Her weight today is 110 pounds. Patient reports she had been weighing herself daily before breakfast but missed weighing one day this week. Praised patient for her efforts and encouraged her to continue monitoring weight and recording on [facility] notebook provided for her. Patient states she had attempted to call primary care provider's office to notify about reportable weight gain of 3 pounds in a day but they were too busy and she was told that they will call her back. Patient expressed awareness to call back primary care provider's office if she does not hear from them.  She informed care management coordinator that her lower extremity swelling increased couple of days ago when she went with the [place] group to the [facility] and was up on her feet more than 3 hours walking around.  She then had been elevating her feet up and noticed that swelling had gone down from what it was per patient's report.  Ms. Plimpton considers herself on the "green zone" without any increased shortness of breath. She denies coughing, mucus, chest tightness, dizziness or loss of appetite.  Patient reports that it had made a lot of difference with her breathing now that she is not smelling the smoke from the food being cooked (which she considered an irritant) to where she used to live before (niece's apartment).  Patient shares still being able to drive herself and had her car tuned up this week.  Patient is aware of need to schedule for eye doctor's appointment and ENT appointment. She verbalized that it is not as easy now to live without husband's income since she has to pick or choose what she is able to pay for and prioritize her needs from her wants. Patient mentioned not wanting to end up  applying for Medicaid in the future.   Reminded patient of her oncologist appointment scheduled on 6/30 Friday.   Patient was notified that 5 EMMI educational materials were sent through mail to reinforce her knowledge on HF and encouraged her to read and ask questions if any.  She denies of any needs or issues at this time. She agreed to transition of care call next week.  Encouraged patient to call [facility] care management as needed. Contact information with patient.   Plan: Transition of care call on 04/24/15. EMMI educational materials on HF sent through mail.  Samuele Storey A. Copeland Neisen, BSN, RN-BC [place] Management Coordinator Cell: [phone]

## 2016-04-17 ENCOUNTER — Encounter: Payer: Self-pay | Admitting: *Deleted

## 2016-04-17 ENCOUNTER — Telehealth: Payer: Self-pay | Admitting: Internal Medicine

## 2016-04-17 MED ORDER — ALPRAZOLAM 0.25 MG PO TABS: 0.2500 mg | ORAL_TABLET | Freq: Two times a day (BID) | ORAL | Status: DC

## 2016-04-17 NOTE — Telephone Encounter (Signed)
Done hardcopy to [name] - new hardscript for [place] use

## 2016-04-18 ENCOUNTER — Other Ambulatory Visit (HOSPITAL_BASED_OUTPATIENT_CLINIC_OR_DEPARTMENT_OTHER): Payer: Commercial Managed Care - HMO

## 2016-04-18 ENCOUNTER — Telehealth: Payer: Self-pay | Admitting: Internal Medicine

## 2016-04-18 ENCOUNTER — Encounter: Payer: Self-pay | Admitting: Internal Medicine

## 2016-04-18 ENCOUNTER — Ambulatory Visit (HOSPITAL_BASED_OUTPATIENT_CLINIC_OR_DEPARTMENT_OTHER): Payer: Commercial Managed Care - HMO | Admitting: Internal Medicine

## 2016-04-18 VITALS — BP 123/50 | HR 87 | Temp 97.8°F | Resp 19 | Ht 61.0 in | Wt 109.1 lb

## 2016-04-18 DIAGNOSIS — R918 Other nonspecific abnormal finding of lung field: Secondary | ICD-10-CM | POA: Diagnosis not present

## 2016-04-18 DIAGNOSIS — J90 Pleural effusion, not elsewhere classified: Secondary | ICD-10-CM | POA: Diagnosis not present

## 2016-04-18 DIAGNOSIS — R911 Solitary pulmonary nodule: Secondary | ICD-10-CM

## 2016-04-18 LAB — CBC WITH DIFFERENTIAL/PLATELET
BASO%: 0.7 % (ref 0.0–2.0)
BASOS ABS: 0.1 10*3/uL (ref 0.0–0.1)
EOS ABS: 0.2 10*3/uL (ref 0.0–0.5)
EOS%: 2.1 % (ref 0.0–7.0)
HCT: 43.9 % (ref 34.8–46.6)
HEMOGLOBIN: 13.6 g/dL (ref 11.6–15.9)
LYMPH%: 12.9 % — AB (ref 14.0–49.7)
MCH: 29 pg (ref 25.1–34.0)
MCHC: 31 g/dL — AB (ref 31.5–36.0)
MCV: 93.3 fL (ref 79.5–101.0)
MONO#: 1 10*3/uL — ABNORMAL HIGH (ref 0.1–0.9)
MONO%: 13.2 % (ref 0.0–14.0)
NEUT#: 5.3 10*3/uL (ref 1.5–6.5)
NEUT%: 71.1 % (ref 38.4–76.8)
Platelets: 217 10*3/uL (ref 145–400)
RBC: 4.7 10*6/uL (ref 3.70–5.45)
RDW: 16.9 % — ABNORMAL HIGH (ref 11.2–14.5)
WBC: 7.4 10*3/uL (ref 3.9–10.3)
lymph#: 1 10*3/uL (ref 0.9–3.3)

## 2016-04-18 LAB — COMPREHENSIVE METABOLIC PANEL
ALBUMIN: 3.4 g/dL — AB (ref 3.5–5.0)
ALK PHOS: 64 U/L (ref 40–150)
ALT: 18 U/L (ref 0–55)
AST: 21 U/L (ref 5–34)
Anion Gap: 10 mEq/L (ref 3–11)
BUN: 17.2 mg/dL (ref 7.0–26.0)
CHLORIDE: 95 meq/L — AB (ref 98–109)
CO2: 38 mEq/L — ABNORMAL HIGH (ref 22–29)
Calcium: 10 mg/dL (ref 8.4–10.4)
Creatinine: 0.8 mg/dL (ref 0.6–1.1)
EGFR: 88 mL/min/{1.73_m2} — AB (ref >90)
GLUCOSE: 147 mg/dL — AB (ref 70–140)
POTASSIUM: 5.2 meq/L — AB (ref 3.5–5.1)
SODIUM: 142 meq/L (ref 136–145)
Total Bilirubin: 0.37 mg/dL (ref 0.20–1.20)
Total Protein: 7.5 g/dL (ref 6.4–8.3)

## 2016-04-18 NOTE — Telephone Encounter (Signed)
Medication faxed to pharmacy 

## 2016-04-18 NOTE — Progress Notes (Signed)
[place] [phone]   [phone]  CONSULT NOTE  REFERRING PHYSICIAN: Dr. [name]  REASON FOR CONSULTATION:  70 years old African-American female with suspicious pulmonary nodules.  HPI Jasmine [name] is a 70 y.o. female with past medical history significant for multiple medical problems including history of hypertension, COPD, hypothyroidism, congestive heart failure, osteoporosis, anxiety, remote history of left lower extremity deep venous thrombosis as well as long history of smoking and drug abuse. The patient has been complaining of increasing shortness of breath and chest pain with radiation to the back. She had CT angiogram of the chest performed on 03/24/2016 and it showed no evidence for pulmonary embolus but there was a small bilateral pleural effusions right greater than left. There was also spiculated 1.3 x 1.3 cm left upper lobe pulmonary opacity as well as a spiculated 0.9 x 1.2 cm right upper lobe pulmonary opacity. These were new compared to imaging on 11/11/2011. CT scan of the abdomen performed on the same day showed no evidence of metastatic disease in the abdomen. On 03/26/2016 the patient underwent ultrasound-guided left thoracentesis with drainage of 400 mL of pleural fluid. The final cytology was negative for malignancy and showed reactive mesothelial cells. The patient was referred to me today for further evaluation and recommendation regarding these abnormalities on her CT scan of the chest. When seen today she is feeling tired and has shortness of breath baseline and increased with exertion. She also has intermittent chest pain but no cough or hemoptysis. She denied having any significant weight loss or night sweats. She has no headache or visual changes. She has no nausea, vomiting, diarrhea or constipation. Family history significant for father died from lung cancer and mother had stroke. The patient is a widow and has no  children. She currently lives at [facility]. She used to work as an Web designer at [employer]. She has a history of smoking 1 pack per day for around 50 years and quit 4 months ago. She also has history of alcohol and crack cocaine abuse but quit in March 2017 after her husband passed away.  HPI  Past Medical History  Diagnosis Date  . ALLERGIC RHINITIS 05/01/2007  . DEPRESSION 07/21/2008  . FATIGUE 10/10/2010  . HYPERTENSION 05/01/2007  . LUMBAR RADICULOPATHY, RIGHT 08/25/2008  . OSTEOPOROSIS 05/01/2007  . SHOULDER PAIN, LEFT 08/23/2009  . SINUSITIS, CHRONIC 10/10/2010  . SINUSITIS- ACUTE-NOS 10/10/2008  . DVT (deep venous thrombosis) (HCC) 1960s    LLE  . CHF (congestive heart failure) ([place])   . HYPERTHYROIDISM 11/23/2007    Pt endorses having had Graves disease, possibly radioactive iodine x1, but no thyroidectomy  . HYPOTHYROIDISM 08/23/2009  . ASTHMA 05/01/2007    "since I was a child"  . Chronic bronchitis ([place])   . Arthritis     "hands" (12/28/2015)  . Anxiety   . On home oxygen therapy     "3-4L; qd; all the time" (12/28/2015)    Past Surgical History  Procedure Laterality Date  . Appendectomy    . Ectopic pregnancy surgery  "late '60s or early '70s  . Oophorectomy Right   . Electrocardiogram  06/20/2006  . Dilation and curettage of uterus  multiple    history of multiple dialations and curettages and miscarriages, unfortunately never carrying a child to term    Family History  Problem Relation Age of Onset  . Alcohol abuse Brother   . Diabetes Brother   .  Hypertension Other   . Lung cancer Father   . Stroke Mother   . Asthma      maternal aunts  . Thyroid disease Mother     Social History Social History  Substance Use Topics  . Smoking status: Former Smoker -- 1.00 packs/day for 54 years    Types: Cigarettes    Quit date: 07/21/2015  . Smokeless tobacco: Never Used  . Alcohol Use: Yes     Comment: 12/28/2015 "might drink a  beer/month"    Allergies  Allergen Reactions  . Alendronate Sodium Other (See Comments)    Pt does not remember this reaction    Current Outpatient Prescriptions  Medication Sig Dispense Refill  . albuterol (PROVENTIL HFA;VENTOLIN HFA) 108 (90 Base) MCG/ACT inhaler Inhale 2 puffs into the lungs every 6 (six) hours as needed for wheezing or shortness of breath. 1 Inhaler 3  . albuterol (PROVENTIL) (2.5 MG/3ML) 0.083% nebulizer solution Take 3 mLs (2.5 mg total) by nebulization every 4 (four) hours as needed for wheezing. 75 mL 12  . ALPRAZolam (XANAX) 0.25 MG tablet Take 1 tablet (0.25 mg total) by mouth 2 (two) times daily. 60 tablet 2  . aspirin EC 81 MG EC tablet Take 1 tablet (81 mg total) by mouth daily. 30 tablet 0  . citalopram (CELEXA) 10 MG tablet Take 1 tablet (10 mg total) by mouth daily. 30 tablet 3  . feeding supplement, ENSURE ENLIVE, (ENSURE ENLIVE) LIQD Take 237 mLs by mouth 3 (three) times daily between meals. 237 mL 12  . fluticasone furoate-vilanterol (BREO ELLIPTA) 200-25 MCG/INH AEPB Inhale 1 puff into the lungs daily. 1 each 5  . furosemide (LASIX) 40 MG tablet Take 1 tablet (40 mg total) by mouth daily. 30 tablet 3  . levothyroxine (SYNTHROID, LEVOTHROID) 75 MCG tablet Take 1 tablet (75 mcg total) by mouth daily before breakfast. 30 tablet 0  . OXYGEN Inhale 4 L into the lungs continuous.     . potassium chloride (K-DUR,KLOR-CON) 10 MEQ tablet Take 10 mEq by mouth 2 (two) times daily.    [name] tiotropium (SPIRIVA HANDIHALER) 18 MCG inhalation capsule Place 1 capsule (18 mcg total) into inhaler and inhale daily. 30 capsule 2   No current facility-administered medications for this visit.    Review of Systems  Constitutional: positive for fatigue Eyes: negative Ears, nose, mouth, throat, and face: negative Respiratory: positive for cough and dyspnea on exertion Cardiovascular: negative Gastrointestinal: negative Genitourinary:negative Integument/breast:  negative Hematologic/lymphatic: negative Musculoskeletal:negative Neurological: negative Behavioral/Psych: negative Endocrine: negative Allergic/Immunologic: negative  Physical Exam  [id number], healthy, no distress and comfortable SKIN: skin color, texture, turgor are normal, no rashes or significant lesions HEAD: Normocephalic, No masses, lesions, tenderness or abnormalities EYES: normal, PERRLA, Conjunctiva are pink and non-injected EARS: External ears normal, Canals clear OROPHARYNX:no exudate, no erythema and lips, buccal mucosa, and tongue normal  NECK: supple, no adenopathy, no JVD LYMPH:  no palpable lymphadenopathy, no hepatosplenomegaly BREAST:not examined LUNGS: clear to auscultation , and palpation HEART: regular rate & rhythm, no murmurs and no gallops ABDOMEN:abdomen soft, non-tender, normal bowel sounds and no masses or organomegaly BACK: Back symmetric, no curvature., No CVA tenderness EXTREMITIES:no joint deformities, effusion, or inflammation, no edema, no skin discoloration  NEURO: alert & oriented x 3 with fluent speech, no focal motor/sensory deficits  PERFORMANCE STATUS: ECOG 1  LABORATORY DATA: Lab Results  Component Value Date   WBC 7.4 04/18/2016   HGB 13.6 04/18/2016   HCT 43.9 04/18/2016   MCV 93.3  04/18/2016   PLT 217 04/18/2016      Chemistry      Component Value Date/Time   NA 142 04/18/2016 0904   NA 137 03/25/2016 0256   K 5.2* 04/18/2016 0904   K 4.3 03/25/2016 0256   CL 85* 03/25/2016 0256   CO2 38* 04/18/2016 0904   CO2 43* 03/25/2016 0256   BUN 17.2 04/18/2016 0904   BUN 21* 03/25/2016 0256   CREATININE 0.8 04/18/2016 0904   CREATININE 0.75 03/25/2016 0256      Component Value Date/Time   CALCIUM 10.0 04/18/2016 0904   CALCIUM 9.0 03/25/2016 0256   ALKPHOS 64 04/18/2016 0904   ALKPHOS 60 04/09/2016 1434   AST 21 04/18/2016 0904   AST 20 04/09/2016 1434   ALT 18 04/18/2016 0904   ALT 20 04/09/2016 1434   BILITOT 0.37  04/18/2016 0904   BILITOT 0.4 04/09/2016 1434       RADIOGRAPHIC STUDIES: Ct Abdomen Pelvis Wo Contrast  03/24/2016  CLINICAL DATA:  Recent lung masses. EXAM: CT ABDOMEN AND PELVIS WITHOUT CONTRAST TECHNIQUE: Multidetector CT imaging of the abdomen and pelvis was performed following the standard protocol without IV contrast. COMPARISON:  None. FINDINGS: Moderate bilateral pleural effusions and underlying atelectasis are identified. No other abnormalities seen in the lung bases. No free air. No definite free fluid identified. Evaluation of parenchymal organs is limited due to lack of intravenous contrast. High attenuation is seen in the bilateral renal collecting systems, consistent with a contrast enhanced study earlier today. This limits evaluation for renal stones but none are seen. There is a cyst in the lower right kidney. No suspicious renal masses or hydronephrosis. High attenuation posteriorly in the gallbladder is suspicious for sludge or stones. The liver and spleen are normal. The adrenal glands are unremarkable. Limited views of the pancreas demonstrate no abnormalities. There is atherosclerosis in the non aneurysmal abdominal aorta. No adenopathy is identified. The stomach and small bowel are unremarkable. Evaluation of colon is limited as the colon is largely not opacified with contrast. The appendix is not seen but there is no secondary evidence of appendicitis. There is increased attenuation in the subcutaneous fat in the fat of the mesentery, suggesting volume overload. No adenopathy or suspicious masses seen in the pelvis. The bladder is opacified with contrast with no filling defects. The visualized bones are unremarkable other than degenerative changes. No suspicious bony lesions. IMPRESSION: 1. Evaluation is limited without intravenous contrast. No suspicious masses are seen in the abdomen. 2. Bilateral pleural effusions and increased attenuation in the subcutaneous and mesenteric fat  consistent with volume overload. 3. Probable stones or sludge in the gallbladder. Electronically Signed   By: Dorise Bullion III M.D   On: 03/24/2016 19:06   Dg Chest 1 View  03/26/2016  CLINICAL DATA:  Followup pleural effusions. Status post thoracentesis. EXAM: CHEST 1 VIEW COMPARISON:  03/25/2016 FINDINGS: Cardiomegaly remains stable. Diffuse interstitial prominence is stable and suspicious for mild interstitial edema. Decreased size of left pleural effusion noted. Small right pleural effusion and right basilar atelectasis show no significant change. No pneumothorax visualized. Left upper lobe nodular density again demonstrated, as better visualized on recent CT. IMPRESSION: No pneumothorax visualized. Decreased left pleural effusion. Persistent small right pleural effusion right basilar atelectasis. Stable cardiomegaly and mild diffuse interstitial edema pattern. Stable left upper lobe nodular density. See recent chest CT report of 03/24/2016. Electronically Signed   By: Earle Gell M.D.   On: 03/26/2016 15:25   Dg Chest  2 View  03/25/2016  CLINICAL DATA:  Lung mass.  Shortness of breath. EXAM: CHEST  2 VIEW COMPARISON:  03/24/2016 chest radiograph and chest CT. FINDINGS: Stable cardiomediastinal silhouette with mild cardiomegaly. No pneumothorax. Stable small bilateral pleural effusions. Stable mild pulmonary edema. Small nodular opacities in the bilateral upper lungs correlate with the spiculated nodules described on the chest CT study from 1 day prior. Stable bibasilar atelectasis. IMPRESSION: 1. Stable mild congestive heart failure. 2. Stable small bilateral pleural effusions and bibasilar atelectasis. 3. Stable small nodular opacities in the bilateral upper lungs, correlating with the spiculated pulmonary nodules described on the chest CT study from 03/24/2016. Please refer to the chest CT report for follow-up recommendations. Electronically Signed   By: Ilona Sorrel M.D.   On: 03/25/2016 08:55   Ct  Angio Chest Pe W/cm &/or Wo Cm  03/24/2016  CLINICAL DATA:  Pt breathing shallow. Hx COPD, CHF, PE. Denies pain at this time. Swelling noted to bilateral lower extremities. EXAM: CT ANGIOGRAPHY CHEST WITH CONTRAST TECHNIQUE: Multidetector CT imaging of the chest was performed using the standard protocol during bolus administration of intravenous contrast. Multiplanar CT image reconstructions and MIPs were obtained to evaluate the vascular anatomy. CONTRAST:  75 mL Isovue 370 COMPARISON:  11/11/2011 FINDINGS: Mediastinum/Lymph Nodes: No pulmonary emboli or thoracic aortic dissection identified. No mediastinal mass. Mild right hilar lymphadenopathy with the largest lymph node measuring 15 mm. Lungs/Pleura: Bilateral centrilobular emphysema. Small bilateral pleural effusions, right greater than left. Bilateral mild interstitial thickening. Spiculated 13 x 13 mm left upper lobe pulmonary opacity. Spiculated 9 x 12 mm right upper lobe pulmonary opacity. Bibasilar atelectasis. Upper abdomen: No acute findings. Musculoskeletal: No chest wall mass or suspicious bone lesions identified. Review of the MIP images confirms the above findings. IMPRESSION: 1. No evidence pulmonary embolus. 2. Findings concerning for mild pulmonary edema. 3. Small bilateral pleural effusions, right greater than left. 4. Spiculated 13 x 13 mm left upper lobe pulmonary opacity. Spiculated 9 x 12 mm right upper lobe pulmonary opacity. These are new compared with 11/11/2011. These may reflect areas of airspace disease, but true pulmonary lesions cannot be excluded. Follow-up CT chest is recommended in 3 months. Electronically Signed   By: Kathreen Devoid   On: 03/24/2016 11:18   Dg Chest Port 1 View  03/24/2016  CLINICAL DATA:  Shortness of breath since Monday. EXAM: PORTABLE CHEST 1 VIEW COMPARISON:  12/27/2015 FINDINGS: Bilateral lower lobe airspace opacities are noted with small bilateral effusions. Heart is normal size. No acute bony abnormality.  IMPRESSION: Small bilateral pleural effusions with bibasilar atelectasis or infiltrates. Electronically Signed   By: Rolm Baptise M.D.   On: 03/24/2016 09:31   US Thoracentesis Asp Pleural Space W/img Guide  03/26/2016  INDICATION: Symptomatic Left sided pleural effusion EXAM: US THORACENTESIS ASP PLEURAL SPACE W/IMG GUIDE COMPARISON:  None. MEDICATIONS: 10 cc 1% lidocaine COMPLICATIONS: None immediate. TECHNIQUE: Informed written consent was obtained from the patient after a discussion of the risks, benefits and alternatives to treatment. A timeout was performed prior to the initiation of the procedure. Initial ultrasound scanning demonstrates a left pleural effusion. The lower chest was prepped and draped in the usual sterile fashion. 1% lidocaine was used for local anesthesia. Under direct ultrasound guidance, a 19 gauge, 7-cm, Yueh catheter was introduced. An ultrasound image was saved for documentation purposes. The thoracentesis was performed. The catheter was removed and a dressing was applied. The patient tolerated the procedure well without immediate post procedural complication. The  patient was escorted to have an upright chest radiograph. FINDINGS: A total of approximately 400 cc of yellow fluid was removed. Requested samples were sent to the laboratory. IMPRESSION: Successful ultrasound-guided left sided thoracentesis yielding 400 cc of pleural fluid. Read by:  Lavonia Drafts [facility] Electronically Signed   By: Jerilynn Mages.  Shick M.D.   On: 03/26/2016 15:11    ASSESSMENT: This is a very pleasant 70 years old African-American female with suspicious bilateral pulmonary nodules concerning for primary bronchogenic carcinoma. The patient also had bilateral pleural effusion but this was most likely reactive in nature and the cytogenetic analysis showed no evidence of malignancy.   PLAN: I had a lengthy discussion with the patient about her current condition. I showed her the images of the CT scan of the chest. I  recommended for the patient to consider proceeding with a PET scan for further evaluation of the bilateral pulmonary nodules. If the nodules are hypermetabolic on the PET scan, I may consider the patient for CT-guided core biopsy of one of the pulmonary nodules versus referral to pulmonary or cardiothoracic surgery for bronchoscopy and electromagnetic navigational bronchoscopy. The patient agreed to the current plan. I will arrange for her to come back for follow-up visit in 3 weeks for reevaluation and discussion of the PET scan results and further recommendation regarding her condition. She was advised to call immediately if she has any concerning symptoms in the interval. The patient voices understanding of current disease status and treatment options and is in agreement with the current care plan.  All questions were answered. The patient knows to call the clinic with any problems, questions or concerns. We can certainly see the patient much sooner if necessary.  Thank you so much for allowing me to participate in the care of TANEE MALINSKY. I will continue to follow up the patient with you and assist in her care.  I spent 40 minutes counseling the patient face to face. The total time spent in the appointment was 60 minutes.  Disclaimer: This note was dictated with voice recognition software. Similar sounding words can inadvertently be transcribed and may not be corrected upon review.   Jacquline Terrill K. April 18, 2016, 10:02 AM

## 2016-04-18 NOTE — Telephone Encounter (Signed)
per pof to sch pt appt-gave pt copy of avs °

## 2016-04-23 ENCOUNTER — Telehealth: Payer: Self-pay | Admitting: Internal Medicine

## 2016-04-23 ENCOUNTER — Encounter: Payer: Self-pay | Admitting: *Deleted

## 2016-04-23 ENCOUNTER — Other Ambulatory Visit: Payer: Self-pay | Admitting: *Deleted

## 2016-04-23 ENCOUNTER — Telehealth: Payer: Self-pay

## 2016-04-23 MED ORDER — FUROSEMIDE 40 MG PO TABS: 40.0000 mg | ORAL_TABLET | Freq: Two times a day (BID) | ORAL | Status: DC

## 2016-04-23 NOTE — Patient Outreach (Signed)
[facility]) Care Management  04/23/2016  HOLLEIGH COLESON 07-Sep-1946 [id number]   Assessment: Transition of care - week 4  Transition of care call placed and spoke with patient with report that she is over-all "feeling pretty good". Patient stated getting tired when moving around like walking around the building and usually sits down to rest before continuing the activity. Patient also reports taking Aleve for back discomfort "which seems to help" as stated. Patient reports that she seems to be leaning towards the "yellow zone" with her legs swelling, reportable weight gain of 3 pounds in a day and "breathing not as good as usual" with continuous oxygen use. Patient was encouraged to call her primary care provider's office today to report weight gain noted yesterday 7/4, persistent swelling to lower extremities and some shortness of breath at times.  Currently awaiting for a call back from PCP's office for instructions as stated.  According to patient, she was able to purchase a personal weighing scale so she does not have to wait for staff to unlock the spa room where the facility weighing scale is located. She is able to monitor her weight daily before breakfast and records it on [place]. Weight today is 112.8 pounds.   Patient reports that she was seen by oncologist (Dr. Julien Nordmann) on 6/30 with recommendation to have a PET scan done. Patient will be in contact with oncologist for schedule of PET scan and will follow-up with him in 3 weeks.   Patient reports no medication changes made since the last phone conversation we had. Noted on patient's chart later today that primary care provider had ordered to increase Lasix 40 mg twice a day and to  follow-up with him this week. Patient will be calling back to set-up an appointment to see PCP as noted.  Patient had informed care management coordinator that she had spoken to her niece- Colletta [place] to notify her of patient's decision  to assign 2 nieces as her health care power of attorney Sunday Spill[place] and [place]). Patient expressed understanding of need to have paper works/ documents notarized from her bank.   Ms. Lutfi reports that she had received the [facility] educational materials sent through mail for her and had started reading it. She states that it has vital informations to guide her in managing HF and aware to ask questions if any.  Patient denies any additional needs or concerns at this time. She agreed to continue with transition of care call next week. She is aware to call [facility] care management if needed and has contact information with her.    Plan: Will continue with Transition of Care call on 04/30/16 per patient.  Kapono Luhn A. Morghan Kester, BSN, RN-BC [place] Management Coordinator Cell: [phone]

## 2016-04-23 NOTE — Telephone Encounter (Signed)
Please advise 

## 2016-04-23 NOTE — Telephone Encounter (Signed)
Medication faxed to pharmacy 

## 2016-04-23 NOTE — Telephone Encounter (Signed)
Pt called to report symptoms since she is a heart pt: weight gain >3 lb last 3 days 109-112, swelling in legs and stomach and SOB at times. Please advise on what to do.

## 2016-04-23 NOTE — Telephone Encounter (Signed)
Ok to increase the lasix to 40 mg BID for now, but make appt this week for F/U

## 2016-04-23 NOTE — Telephone Encounter (Signed)
Patient aware and said she will call back for an appointment

## 2016-04-23 NOTE — Progress Notes (Signed)
Oncology Nurse Navigator Documentation  Oncology Nurse Navigator Flowsheets 04/23/2016  Navigator Encounter Type Other  Treatment Phase Pre-Tx/Tx Discussion  Barriers/Navigation Needs Coordination of Care  Interventions Coordination of Care  Acuity Level 1  Time Spent with Patient 15   I noticed that Jasmine [name]'s PET scan has not been scheduled.  I contacted pre cert team to see if this has been completed before scheduling.  Dr. Julien Nordmann updated.

## 2016-04-24 ENCOUNTER — Encounter: Payer: Self-pay | Admitting: *Deleted

## 2016-04-24 ENCOUNTER — Telehealth: Payer: Self-pay | Admitting: Emergency Medicine

## 2016-04-24 ENCOUNTER — Telehealth: Payer: Self-pay | Admitting: *Deleted

## 2016-04-24 DIAGNOSIS — J449 Chronic obstructive pulmonary disease, unspecified: Secondary | ICD-10-CM | POA: Diagnosis not present

## 2016-04-24 MED ORDER — CITALOPRAM HYDROBROMIDE 10 MG PO TABS: 10.0000 mg | ORAL_TABLET | Freq: Every day | ORAL | Status: DC

## 2016-04-24 MED ORDER — ALPRAZOLAM 0.25 MG PO TABS: 0.2500 mg | ORAL_TABLET | Freq: Two times a day (BID) | ORAL | Status: DC | PRN

## 2016-04-24 MED ORDER — FUROSEMIDE 40 MG PO TABS: 40.0000 mg | ORAL_TABLET | Freq: Two times a day (BID) | ORAL | Status: DC

## 2016-04-24 NOTE — Telephone Encounter (Signed)
Done hardcopy to [name]  

## 2016-04-24 NOTE — Telephone Encounter (Signed)
Notified pt w/MD response rx fax to pharmacy...Johny [facility]ss

## 2016-04-24 NOTE — Telephone Encounter (Signed)
Rec'd fax pt needing refill on her Alprazolam 0.25mg  take 1 twice a day....Johny Chess

## 2016-04-24 NOTE — Telephone Encounter (Signed)
Prescription given to lucy

## 2016-04-24 NOTE — Telephone Encounter (Signed)
Please advise 

## 2016-04-24 NOTE — Telephone Encounter (Signed)
[facility] called. Needed a fax of the Prescription for furosemide (LASIX) 40 MG tablet so they can administer it. Fax # [phone]. Thanks.

## 2016-04-24 NOTE — Progress Notes (Signed)
Oncology Nurse Navigator Documentation  Oncology Nurse Navigator Flowsheets 04/24/2016  Navigator Encounter Type Other  Treatment Phase Pre-Tx/Tx Discussion  Barriers/Navigation Needs Coordination of Care  Interventions Coordination of Care  Acuity Level 1  Time Spent with Patient 15   I was notified by pre cert team that approval for scan is being worked on.  Dr. Julien Nordmann updated.

## 2016-04-24 NOTE — Telephone Encounter (Signed)
Done hardcopy to [facility] to remind pt she does not have to take it scheduled, can be BID prn; she should only take if needed

## 2016-04-25 NOTE — Telephone Encounter (Signed)
Faxed to [facility]

## 2016-04-28 ENCOUNTER — Telehealth: Payer: Self-pay | Admitting: *Deleted

## 2016-04-28 ENCOUNTER — Telehealth: Payer: Self-pay | Admitting: Medical Oncology

## 2016-04-28 DIAGNOSIS — F321 Major depressive disorder, single episode, moderate: Secondary | ICD-10-CM | POA: Diagnosis not present

## 2016-04-28 DIAGNOSIS — R918 Other nonspecific abnormal finding of lung field: Secondary | ICD-10-CM

## 2016-04-28 DIAGNOSIS — F419 Anxiety disorder, unspecified: Secondary | ICD-10-CM | POA: Diagnosis not present

## 2016-04-28 NOTE — Telephone Encounter (Signed)
Oncology Nurse Navigator Documentation  Oncology Nurse Navigator Flowsheets 04/28/2016  Navigator Encounter Type Telephone  Telephone Outgoing Call  Treatment Phase Pre-Tx/Tx Discussion  Barriers/Navigation Needs Coordination of Care  Interventions Coordination of Care  Acuity Level 1  Time Spent with Patient 15   I spoke with Dr. Julien Nordmann to update him on PET scan scheduled for 05/07/16.  I asked if she could have a follow up with her at [facility] where she can see Rad Onc and T surgery.  He was ok with plan.  I called patient to update her.  She verbalized understanding.

## 2016-04-28 NOTE — Telephone Encounter (Signed)
Pt called and asked about the PET scan procedure. I instructed her about what NPO means including gum and mints.

## 2016-04-30 ENCOUNTER — Other Ambulatory Visit: Payer: Self-pay | Admitting: *Deleted

## 2016-04-30 NOTE — Patient Outreach (Signed)
[facility]) Care Management  04/30/2016  Jasmine [name] [birth year] [id number]   Assessment: Transition of Care call - final Called and spoke with patient briefly.  She states that she is "in an outing" with the [place] group and has no idea when it will be done.  Patient advised to care management coordinator that she will call back later when the outing is done and she is back at [facility].   Plan: Will await for patient's call back to get updates and information.   ADDENDUM: Unable to receive a call back from patient as verbalized earlier. Call placed and spoke with patient briefly.  Patient states that she is in the dining room table and just now started to eat dinner.  She mentioned that her weight today was 109 pounds and swelling to both legs is better.  Patient told care management coordinator that it is not a good time to talk on the phone and may have an activity scheduled for them after dinner. She verbalized that it would be better to call back tomorrow.   Plan: Will call patient back tomorrow per her request.  Edwena Felty A. Milisa Kimbell, BSN, RN-BC [facility] Management Coordinator Cell: [phone]

## 2016-05-01 ENCOUNTER — Telehealth: Payer: Self-pay | Admitting: Medical Oncology

## 2016-05-01 ENCOUNTER — Other Ambulatory Visit: Payer: Self-pay | Admitting: *Deleted

## 2016-05-01 NOTE — Telephone Encounter (Signed)
Pt concerned her oxygen tank may not be working -she is on 2 liter and she remembered  her O2 sat was lower here ( 6/30) on her tank compared to her O2 sat on our tank. I called [name] and was told that a new tank was delivered today. I requested a clinical visit to check o2. Linna Hoff [facility]) stated he will schedule a clinical visit for this week . Pt notified. I told pt to go to ED if she has any worsening SOB.

## 2016-05-01 NOTE — Patient Outreach (Signed)
[facility]) Care Management  05/01/2016  Jasmine [name] 15-Dec-1945 [id number]   Assessment: Follow-up Transition of Care call  Called and spoke with patient today who reports that she is "feeling pretty good so far". She identified herself under "green zone" with no reportable signs and symptoms of HF flare-up. Patient expressed awareness to call and report to provider if she starts feeling to be on the yellow zone and call 911 if going into the red zone. Ms. Manzer reports weighing herself daily and recording on [place]. Today's weight is 110 pounds. She verbalized no noted reportable weight gain from the weight readings for this week.  Patient states no changes on her medications. She ran out of rescue inhaler (Ventolin) and [name] staff had already ordered one for her. She verbalized continuing to take Breo and Spiriva for maintenance medications. Education reinforced about use of rescue inhaler as needed.  Patient acknowledge the importance of notifying provider for HF signs and symptoms being noticed and she reports continuing to use Lasix twice a day as ordered. She states "breathing pretty good" and "swelling to legs is a lot better". She shares keeping her feet up and elevating it at night as well. She verbalized not adding salt to her food or not eating the food being served which seemed to taste salty for her.  Patient informs care management coordinator that she is scheduled for a PET scan on 7/19 Wednesday and understands to have nothing by mouth after midnight before the procedure.  Patient will see Dr. Julien Nordmann (oncologist) on 7/20 to discuss PET scan results. She also reports having a follow up appointment with primary care provider on 7/21. Transportation is being provided by [employer].  Patient verbalized preference to be called on the phone for follow-up on 7/24 instead of a Home visit.  Patient denies any issues, concerns or needs at this point. She is  aware to call [facility] care management if needed and has the contact information.   Plan: Will follow-up patient with a phone call on 05/12/16 as preferred.   Davita Sublett A. Olayinka Gathers, BSN, RN-BC [facility] Management Coordinator Cell: [phone]

## 2016-05-02 ENCOUNTER — Telehealth: Payer: Self-pay | Admitting: *Deleted

## 2016-05-02 ENCOUNTER — Encounter: Payer: Self-pay | Admitting: *Deleted

## 2016-05-02 NOTE — Progress Notes (Signed)
Oncology Nurse Navigator Documentation  Oncology Nurse Navigator Flowsheets 05/02/2016  Navigator Encounter Type Other  Treatment Phase Pre-Tx/Tx Discussion  Barriers/Navigation Needs Coordination of Care  Interventions Coordination of Care  Coordination of Care Other  Acuity Level 1  Acuity Level 1 Minimal follow up required  Time Spent with Patient 15   I followed up with Dr. Julien Nordmann today regarding Jasmine [name]'s work up.  No new orders received at this time.

## 2016-05-02 NOTE — Telephone Encounter (Signed)
Mailed clinic letter to pt.  

## 2016-05-07 ENCOUNTER — Ambulatory Visit (HOSPITAL_COMMUNITY)
Admission: RE | Admit: 2016-05-07 | Discharge: 2016-05-07 | Disposition: A | Discharge status: Home / Self Care | Payer: Commercial Managed Care - HMO | Source: Ambulatory Visit | Attending: Internal Medicine | Admitting: Internal Medicine

## 2016-05-07 DIAGNOSIS — J439 Emphysema, unspecified: Secondary | ICD-10-CM | POA: Diagnosis not present

## 2016-05-07 DIAGNOSIS — I700 Atherosclerosis of aorta: Secondary | ICD-10-CM | POA: Insufficient documentation

## 2016-05-07 DIAGNOSIS — R911 Solitary pulmonary nodule: Secondary | ICD-10-CM | POA: Diagnosis present

## 2016-05-07 DIAGNOSIS — R918 Other nonspecific abnormal finding of lung field: Secondary | ICD-10-CM | POA: Diagnosis not present

## 2016-05-07 LAB — GLUCOSE, CAPILLARY: Glucose-Capillary: 112 mg/dL — ABNORMAL HIGH (ref 65–99)

## 2016-05-07 MED ORDER — FLUDEOXYGLUCOSE F - 18 (FDG) INJECTION
5.4000 | Freq: Once | INTRAVENOUS | Status: AC | PRN
  Administered 2016-05-07: 5.4 via INTRAVENOUS

## 2016-05-08 ENCOUNTER — Telehealth: Payer: Self-pay | Admitting: Emergency Medicine

## 2016-05-08 ENCOUNTER — Ambulatory Visit
Admission: RE | Admit: 2016-05-08 | Discharge: 2016-05-08 | Disposition: A | Discharge status: Home / Self Care | Payer: Commercial Managed Care - HMO | Source: Ambulatory Visit | Attending: Radiation Oncology | Admitting: Radiation Oncology

## 2016-05-08 ENCOUNTER — Encounter: Payer: Self-pay | Admitting: Internal Medicine

## 2016-05-08 ENCOUNTER — Institutional Professional Consult (permissible substitution) (INDEPENDENT_AMBULATORY_CARE_PROVIDER_SITE_OTHER): Payer: Commercial Managed Care - HMO | Admitting: Cardiothoracic Surgery

## 2016-05-08 ENCOUNTER — Encounter: Payer: Self-pay | Admitting: *Deleted

## 2016-05-08 ENCOUNTER — Ambulatory Visit (HOSPITAL_BASED_OUTPATIENT_CLINIC_OR_DEPARTMENT_OTHER): Payer: Commercial Managed Care - HMO | Admitting: Internal Medicine

## 2016-05-08 ENCOUNTER — Other Ambulatory Visit (HOSPITAL_BASED_OUTPATIENT_CLINIC_OR_DEPARTMENT_OTHER): Payer: Commercial Managed Care - HMO

## 2016-05-08 VITALS — BP 124/51 | HR 93 | Temp 97.7°F | Resp 16 | Ht 61.0 in | Wt 110.4 lb

## 2016-05-08 VITALS — BP 124/51 | HR 93 | Temp 97.7°F | Resp 16 | Wt 110.4 lb

## 2016-05-08 DIAGNOSIS — R918 Other nonspecific abnormal finding of lung field: Secondary | ICD-10-CM

## 2016-05-08 LAB — CBC WITH DIFFERENTIAL/PLATELET
BASO%: 0.4 % (ref 0.0–2.0)
Basophils Absolute: 0 10*3/uL (ref 0.0–0.1)
EOS%: 3.1 % (ref 0.0–7.0)
Eosinophils Absolute: 0.2 10*3/uL (ref 0.0–0.5)
HEMATOCRIT: 44 % (ref 34.8–46.6)
HGB: 14 g/dL (ref 11.6–15.9)
LYMPH#: 1.7 10*3/uL (ref 0.9–3.3)
LYMPH%: 22.6 % (ref 14.0–49.7)
MCH: 29 pg (ref 25.1–34.0)
MCHC: 31.8 g/dL (ref 31.5–36.0)
MCV: 91.1 fL (ref 79.5–101.0)
MONO#: 0.5 10*3/uL (ref 0.1–0.9)
MONO%: 7.1 % (ref 0.0–14.0)
NEUT%: 66.8 % (ref 38.4–76.8)
NEUTROS ABS: 5.1 10*3/uL (ref 1.5–6.5)
Platelets: 190 10*3/uL (ref 145–400)
RBC: 4.83 10*6/uL (ref 3.70–5.45)
RDW: 16.9 % — ABNORMAL HIGH (ref 11.2–14.5)
WBC: 7.7 10*3/uL (ref 3.9–10.3)
nRBC: 0 % (ref 0–0)

## 2016-05-08 LAB — COMPREHENSIVE METABOLIC PANEL
ALT: 19 U/L (ref 0–55)
AST: 37 U/L — AB (ref 5–34)
Albumin: 3.9 g/dL (ref 3.5–5.0)
Alkaline Phosphatase: 70 U/L (ref 40–150)
Anion Gap: 14 mEq/L — ABNORMAL HIGH (ref 3–11)
BUN: 22.9 mg/dL (ref 7.0–26.0)
CALCIUM: 10.3 mg/dL (ref 8.4–10.4)
CHLORIDE: 87 meq/L — AB (ref 98–109)
CO2: 37 meq/L — AB (ref 22–29)
CREATININE: 0.8 mg/dL (ref 0.6–1.1)
EGFR: 82 mL/min/{1.73_m2} — ABNORMAL LOW (ref >90)
GLUCOSE: 109 mg/dL (ref 70–140)
Potassium: 4.9 mEq/L (ref 3.5–5.1)
SODIUM: 138 meq/L (ref 136–145)
Total Bilirubin: 0.62 mg/dL (ref 0.20–1.20)
Total Protein: 8.8 g/dL — ABNORMAL HIGH (ref 6.4–8.3)

## 2016-05-08 NOTE — Progress Notes (Signed)
[place] [phone]   [phone]  OFFICE PROGRESS NOTE  [name], MD [place] [address]  DIAGNOSIS: Bilateral pulmonary nodule suspicious for lung cancer.  PRIOR THERAPY: None  CURRENT THERAPY: None.  INTERVAL HISTORY: Jasmine [name] 70 y.o. female returns to the clinic today for follow-up visit. The patient is feeling fine today with no specific complaints except for the persistent low back pain. She currently take Aleve with some improvement. She denied having any significant chest pain, but has shortness of breath at baseline and increased with exertion and she is currently on home oxygen. She denied having any significant cough or hemoptysis. The patient denied having any significant weight loss or night sweats. She has no nausea or vomiting. She had a recent PET scan and she is here for evaluation and discussion of her scan results and further recommendation regarding her condition.  MEDICAL HISTORY: Past Medical History  Diagnosis Date  . ALLERGIC RHINITIS 05/01/2007  . DEPRESSION 07/21/2008  . FATIGUE 10/10/2010  . HYPERTENSION 05/01/2007  . LUMBAR RADICULOPATHY, RIGHT 08/25/2008  . OSTEOPOROSIS 05/01/2007  . SHOULDER PAIN, LEFT 08/23/2009  . SINUSITIS, CHRONIC 10/10/2010  . SINUSITIS- ACUTE-NOS 10/10/2008  . DVT (deep venous thrombosis) (HCC) 1960s    LLE  . CHF (congestive heart failure) ([place])   . HYPERTHYROIDISM 11/23/2007    Pt endorses having had Graves disease, possibly radioactive iodine x1, but no thyroidectomy  . HYPOTHYROIDISM 08/23/2009  . ASTHMA 05/01/2007    "since I was a child"  . Chronic bronchitis ([place])   . Arthritis     "hands" (12/28/2015)  . Anxiety   . On home oxygen therapy     "3-4L; qd; all the time" (12/28/2015)    ALLERGIES:  is allergic to alendronate sodium.  MEDICATIONS:  Current Outpatient Prescriptions  Medication Sig Dispense Refill  . albuterol (PROVENTIL HFA;VENTOLIN  HFA) 108 (90 Base) MCG/ACT inhaler Inhale 2 puffs into the lungs every 6 (six) hours as needed for wheezing or shortness of breath. 1 Inhaler 3  . albuterol (PROVENTIL) (2.5 MG/3ML) 0.083% nebulizer solution Take 3 mLs (2.5 mg total) by nebulization every 4 (four) hours as needed for wheezing. (Patient not taking: Reported on 05/01/2016) 75 mL 12  . ALPRAZolam (XANAX) 0.25 MG tablet Take 1 tablet (0.25 mg total) by mouth 2 (two) times daily as needed for anxiety. 60 tablet 2  . aspirin EC 81 MG EC tablet Take 1 tablet (81 mg total) by mouth daily. 30 tablet 0  . citalopram (CELEXA) 10 MG tablet Take 1 tablet (10 mg total) by mouth daily. 30 tablet 11  . feeding supplement, ENSURE ENLIVE, (ENSURE ENLIVE) LIQD Take 237 mLs by mouth 3 (three) times daily between meals. 237 mL 12  . fluticasone furoate-vilanterol (BREO ELLIPTA) 200-25 MCG/INH AEPB Inhale 1 puff into the lungs daily. 1 each 5  . furosemide (LASIX) 40 MG tablet Take 1 tablet (40 mg total) by mouth 2 (two) times daily. 30 tablet 3  . levothyroxine (SYNTHROID, LEVOTHROID) 75 MCG tablet Take 1 tablet (75 mcg total) by mouth daily before breakfast. 30 tablet 0  . OXYGEN Inhale 4 L into the lungs continuous.     . potassium chloride (K-DUR,KLOR-CON) 10 MEQ tablet Take 10 mEq by mouth 2 (two) times daily.    [name] tiotropium (SPIRIVA HANDIHALER) 18 MCG inhalation capsule Place 1 capsule (18 mcg total) into inhaler and inhale daily. 30 capsule 2  No current facility-administered medications for this visit.    SURGICAL HISTORY:  Past Surgical History  Procedure Laterality Date  . Appendectomy    . Ectopic pregnancy surgery  "late '60s or early '70s  . Oophorectomy Right   . Electrocardiogram  06/20/2006  . Dilation and curettage of uterus  multiple    history of multiple dialations and curettages and miscarriages, unfortunately never carrying a child to term    REVIEW OF SYSTEMS:  Constitutional: positive for fatigue Eyes: negative Ears,  nose, mouth, throat, and face: negative Respiratory: positive for cough and dyspnea on exertion Cardiovascular: negative Gastrointestinal: negative Genitourinary:negative Integument/breast: negative Hematologic/lymphatic: negative Musculoskeletal:positive for back pain Neurological: negative Behavioral/Psych: negative Endocrine: negative Allergic/Immunologic: negative   PHYSICAL EXAMINATION: General appearance: alert, cooperative, fatigued and no distress Head: Normocephalic, without obvious abnormality, atraumatic Neck: no adenopathy, no JVD, supple, symmetrical, trachea midline and thyroid not enlarged, symmetric, no tenderness/mass/nodules Lymph nodes: Cervical, supraclavicular, and axillary nodes normal. Resp: wheezes bilaterally Back: symmetric, no curvature. ROM normal. No CVA tenderness. Cardio: regular rate and rhythm, S1, S2 normal, no murmur, click, rub or gallop GI: soft, non-tender; bowel sounds normal; no masses,  no organomegaly Extremities: extremities normal, atraumatic, no cyanosis or edema Neurologic: Alert and oriented X 3, normal strength and tone. Normal symmetric reflexes. Normal coordination and gait  ECOG PERFORMANCE STATUS: 1 - Symptomatic but completely ambulatory  There were no vitals taken for this visit.  LABORATORY DATA: Lab Results  Component Value Date   WBC 7.7 05/08/2016   HGB 14.0 05/08/2016   HCT 44.0 05/08/2016   MCV 91.1 05/08/2016   PLT 190 05/08/2016      Chemistry      Component Value Date/Time   NA 142 04/18/2016 0904   NA 137 03/25/2016 0256   K 5.2* 04/18/2016 0904   K 4.3 03/25/2016 0256   CL 85* 03/25/2016 0256   CO2 38* 04/18/2016 0904   CO2 43* 03/25/2016 0256   BUN 17.2 04/18/2016 0904   BUN 21* 03/25/2016 0256   CREATININE 0.8 04/18/2016 0904   CREATININE 0.75 03/25/2016 0256      Component Value Date/Time   CALCIUM 10.0 04/18/2016 0904   CALCIUM 9.0 03/25/2016 0256   ALKPHOS 64 04/18/2016 0904   ALKPHOS 60  04/09/2016 1434   AST 21 04/18/2016 0904   AST 20 04/09/2016 1434   ALT 18 04/18/2016 0904   ALT 20 04/09/2016 1434   BILITOT 0.37 04/18/2016 0904   BILITOT 0.4 04/09/2016 1434       RADIOGRAPHIC STUDIES: Nm Pet Image Initial (pi) Skull Base To Thigh  05/07/2016  CLINICAL DATA:  Initial treatment strategy for bilateral pulmonary nodules. EXAM: NUCLEAR MEDICINE PET SKULL BASE TO THIGH TECHNIQUE: 5.4 mCi F-18 FDG was injected intravenously. Full-ring PET imaging was performed from the skull base to thigh after the radiotracer. CT data was obtained and used for attenuation correction and anatomic localization. FASTING BLOOD GLUCOSE:  Value: 112 mg/dl COMPARISON:  Chest CTA on 03/24/2016 and chest CT on 11/11/2011 FINDINGS: NECK No hypermetabolic lymph nodes in the neck. CHEST No hypermetabolic mediastinal or hilar nodes. Mild emphysema noted. Bilateral pleural effusions and bilateral lower lobe atelectasis have resolved since previous study. Spiculated pulmonary nodule in the left upper lobe measuring 12 mm on image 24/8 is new since 2013 exam and shows low-grade metabolic activity with SUV max of 1.3. Smaller spiculated pulmonary nodule in the right upper lobe on image 29/8 measures 9 mm. This is also new since  2013 and shows low-grade metabolic activity with SUV max of 1.1. A third 7 mm pulmonary nodule is seen in the medial right lower lobe on image 44/8. This is also new since 2013 and shows low-grade metabolic activity with SUV max of 1.8. ABDOMEN/PELVIS No abnormal hypermetabolic activity within the liver, pancreas, adrenal glands, or spleen. No hypermetabolic lymph nodes in the abdomen or pelvis. Aortic atherosclerosis noted. SKELETON No focal hypermetabolic activity to suggest skeletal metastasis. IMPRESSION: Three small spiculated pulmonary nodules in left upper lobe, right upper lobe, and right lower lobe show low-grade metabolic activity. Low-grade synchronous bronchogenic adenocarcinomas cannot  be excluded. Consider tissue sampling or continued short-term followup by chest CT in 3 months. No evidence of thoracic nodal or distant metastatic disease. Mild emphysema. Aortic atherosclerosis noted. Electronically Signed   By: Earle Gell M.D.   On: 05/07/2016 14:41    ASSESSMENT AND PLAN: This is a very pleasant 70 years old African-American female with bilateral pulmonary nodules suspicious for low-grade adenocarcinoma versus inflammatory process. The PET scan showed mild metabolic activity in the pulmonary nodules. I had a lengthy discussion with the patient today about her current condition and further recommendation regarding her condition. We also discussed her case at the weekly thoracic conference earlier today. The recommendation is to monitor the patient closely with repeat CT scan of the chest in 3 months for further evaluation of these pulmonary nodules. If there is any evidence for enlargement of the pulmonary nodules, the patient may benefit from bronchoscopy with electromagnetic navigational bronchoscopy for tissue diagnosis. She will be seen later today by Dr. Servando Snare for evaluation and discussion of this option. For the back pain, she will continue on NSAIDs for now and I recommended for the patient to reconsult with her primary care physician regarding management of this condition. She has no evidence of metastatic bone disease on the PET scan. The patient was advised to call immediately if she has any concerning symptoms in the interval. The patient voices understanding of current disease status and treatment options and is in agreement with the current care plan.  All questions were answered. The patient knows to call the clinic with any problems, questions or concerns. We can certainly see the patient much sooner if necessary.  I spent 15 minutes counseling the patient face to face. The total time spent in the appointment was 25 minutes.  Disclaimer: This note was dictated  with voice recognition software. Similar sounding words can inadvertently be transcribed and may not be corrected upon review.

## 2016-05-08 NOTE — Telephone Encounter (Signed)
Pt needs a referral to see Dr Sondra Come. Please follow up thanks. Coding # R91.8

## 2016-05-08 NOTE — Progress Notes (Signed)
Oncology Nurse Navigator Documentation  Oncology Nurse Navigator Flowsheets 05/08/2016  Navigator Encounter Type Clinic/MDC  Abnormal Finding Date 03/24/2016  Patient Visit Type MedOnc  Treatment Phase Pre-Tx/Tx Discussion  Barriers/Navigation Needs Education  Education Other  Interventions Education Method  Education Method Verbal  Acuity Level 1  Time Spent with Patient 30   I spoke with patient today at [facility] clinic. Gave and explained information on next steps.

## 2016-05-08 NOTE — Telephone Encounter (Signed)
done

## 2016-05-08 NOTE — Progress Notes (Signed)
[address]       [place] [address]             [phone]                    Jasmine [name] Medical Record [id number] Date of Birth: Feb 17, 1946  Referring: Curt Bears, MD Primary Care: [name], MD  Chief Complaint:  Lung Mass   History of Present Illness:    Jasmine [name] 70 y.o. female is seen in  [facility]  for multiple lung nodules.  She has past medical history of   history of hypertension, COPD, hypothyroidism, congestive heart failure, osteoporosis, anxiety, remote history of left lower extremity deep venous thrombosis  And as long history of smoking and drug abuse. The patient  Was  complaining of increasing shortness of breath and chest pain with radiation to the back. She had CT angiogram of the chest performed on 03/24/2016 to RO PE ,  There was  no evidence for pulmonary embolus but there was a small bilateral pleural effusions right greater than left. There was also spiculated 1.3 x 1.3 cm left upper lobe pulmonary opacity as well as a spiculated 0.9 x 1.2 cm right upper lobe pulmonary opacity,  new compared to imaging on 11/11/2011.  On 03/26/2016 ta  ultrasound-guided left thoracentesis with drainage of 400 mL of pleural fluid. The final cytology was negative for malignancy and showed reactive mesothelial cells.  The patient was referred to Oncology   further evaluation .  Patient complains of  Being  feeling tired and has shortness of breath baseline and increased with exertion. She moves with a walker and is on chronic O2 therapy.She  lives at [facility] facility.  She denied having any significant weight loss or night sweats. She has no headache or visual changes. She has no nausea, vomiting, diarrhea or constipation.   Family history significant for father died from lung cancer and mother had stroke.  The patient is a widow, husband died 76 months ago  and has no children. .  She has a history of smoking 1 pack  per day for around 50 years and quit 4 months ago. She also has history of alcohol and crack cocaine abuse but quit in March 2017 after her husband passed away.      Current Activity/ Functional Status:  Patient is not independent with mobility/ambulation, transfers, ADL's, IADL's.   Zubrod Score: At the time of surgery this patient's most appropriate activity status/level should be described as: []     0    Normal activity, no symptoms []     1    Restricted in physical strenuous activity but ambulatory, able to do out light work []     2    Ambulatory and capable of self care, unable to do work activities, up and about               >50 % of waking hours                              [x]     3    Only limited self care, in bed greater than 50% of waking hours []     4    Completely disabled, no self care, confined to bed or chair []     5    Moribund   Past Medical History:  Diagnosis Date  .  ALLERGIC RHINITIS 05/01/2007  . Anxiety   . Arthritis    "hands" (12/28/2015)  . ASTHMA 05/01/2007   "since I was a child"  . CHF (congestive heart failure) ([place])   . Chronic bronchitis ([place])   . DEPRESSION 07/21/2008  . DVT (deep venous thrombosis) ([facility]) 1960s   LLE  . FATIGUE 10/10/2010  . HYPERTENSION 05/01/2007  . HYPERTHYROIDISM 11/23/2007   Pt endorses having had Graves disease, possibly radioactive iodine x1, but no thyroidectomy  . HYPOTHYROIDISM 08/23/2009  . LUMBAR RADICULOPATHY, RIGHT 08/25/2008  . On home oxygen therapy    "3-4L; qd; all the time" (12/28/2015)  . OSTEOPOROSIS 05/01/2007  . SHOULDER PAIN, LEFT 08/23/2009  . SINUSITIS, CHRONIC 10/10/2010  . SINUSITIS- ACUTE-NOS 10/10/2008    Past Surgical History:  Procedure Laterality Date  . APPENDECTOMY    . DILATION AND CURETTAGE OF UTERUS  multiple   history of multiple dialations and curettages and miscarriages, unfortunately never carrying a child to term  . ECTOPIC PREGNANCY SURGERY  "late '60s or early '70s  .  ELECTROCARDIOGRAM  06/20/2006  . OOPHORECTOMY Right     Family History  Problem Relation Age of Onset  . Lung cancer Father   . Alcohol abuse Brother   . Diabetes Brother   . Hypertension Other   . Stroke Mother   . Thyroid disease Mother   . Asthma      maternal aunts    Social History   Social History  . Marital status: Married    Spouse name: N/A  . Number of children: N/A  . Years of education: N/A   Occupational History  . RETIRED [employer]   Social History Main Topics  . Smoking status: Former Smoker    Packs/day: 1.00    Years: 54.00    Types: Cigarettes    Quit date: 07/21/2015  . Smokeless tobacco: Never Used  . Alcohol use Yes     Comment: 12/28/2015 "might drink a beer/month"  . Drug use:     Frequency: 2.0 times per week    Types: "Crack" cocaine     Comment: 12/28/2015 "sometimes weekends and holidays"  . Sexual activity: No   Other Topics Concern  . Not on file   Social History Narrative   Lives in [place], [place]. Lives with husband, no children, but husband has 3 kids. Ambulatory, without cane or walker. Smokes cigarettes, currently 1 ppd, smoked for >20-30 yrs. Drinks beer, ~2/day, occasionally liquor, but ? suspect minimizing. Occasional MJ and endorses crack cocaine occasionally    History  Smoking Status  . Former Smoker  . Packs/day: 1.00  . Years: 54.00  . Types: Cigarettes  . Quit date: 07/21/2015  Smokeless Tobacco  . Never Used    History  Alcohol Use  . Yes    Comment: 12/28/2015 "might drink a beer/month"     Allergies  Allergen Reactions  . Alendronate Sodium Other (See Comments)    Pt does not remember this reaction    Current Outpatient Prescriptions  Medication Sig Dispense Refill  . albuterol (PROVENTIL HFA;VENTOLIN HFA) 108 (90 Base) MCG/ACT inhaler Inhale 2 puffs into the lungs every 6 (six) hours as needed for wheezing or shortness of breath. 1 Inhaler 3  . albuterol (PROVENTIL) (2.5 MG/3ML) 0.083% nebulizer  solution Take 3 mLs (2.5 mg total) by nebulization every 4 (four) hours as needed for wheezing. (Patient not taking: Reported on 05/01/2016) 75 mL 12  . ALPRAZolam (XANAX) 0.25 MG tablet Take 1  tablet (0.25 mg total) by mouth 2 (two) times daily as needed for anxiety. 60 tablet 2  . aspirin EC 81 MG EC tablet Take 1 tablet (81 mg total) by mouth daily. 30 tablet 0  . citalopram (CELEXA) 10 MG tablet Take 1 tablet (10 mg total) by mouth daily. 30 tablet 11  . feeding supplement, ENSURE ENLIVE, (ENSURE ENLIVE) LIQD Take 237 mLs by mouth 3 (three) times daily between meals. 237 mL 12  . fluticasone furoate-vilanterol (BREO ELLIPTA) 200-25 MCG/INH AEPB Inhale 1 puff into the lungs daily. 1 each 5  . furosemide (LASIX) 40 MG tablet Take 1 tablet (40 mg total) by mouth 2 (two) times daily. 30 tablet 3  . levothyroxine (SYNTHROID, LEVOTHROID) 75 MCG tablet Take 1 tablet (75 mcg total) by mouth daily before breakfast. 30 tablet 0  . OXYGEN Inhale 4 L into the lungs continuous.     . potassium chloride (K-DUR,KLOR-CON) 10 MEQ tablet Take 10 mEq by mouth 2 (two) times daily.    [name] tiotropium (SPIRIVA HANDIHALER) 18 MCG inhalation capsule Place 1 capsule (18 mcg total) into inhaler and inhale daily. 30 capsule 2   No current facility-administered medications for this visit.      Review of Systems:     Cardiac Review of Systems: Y or N  Chest Pain [ y   ]  Resting SOB y[   ] Exertional SOB  [ y ]  Orthopnea [  n]   Pedal Edema [  n ]    Palpitations [n  ] Syncope  [n  ]   Presyncope [ n  ]  General Review of Systems: [Y] = yes [  ]=no Constitional: recent weight change [ n ];  Wt loss over the last 3 months [   ] anorexia [  ]; fatigue [  ]; nausea [  ]; night sweats [  ]; fever [  ]; or chills [  ];          Dental: poor dentition[  ]; Last Dentist visit:   Eye : blurred vision [  ]; diplopia [   ]; vision changes [  ];  Amaurosis fugax[  ]; Resp: cough [  ];  wheezing[  ];  hemoptysis[  ]; shortness of  breath[y  ]; paroxysmal nocturnal dyspnea[  ]; dyspnea on exertion[  ]; or orthopnea[  ];  GI:  gallstones[  ], vomiting[  ];  dysphagia[  ]; melena[  ];  hematochezia [  ]; heartburn[  ];   Hx of  Colonoscopy[  ]; GU: kidney stones [  ]; hematuria[  ];   dysuria [  ];  nocturia[  ];  history of     obstruction [  ]; urinary frequency [  ]             Skin: rash, swelling[  ];, hair loss[  ];  peripheral edema[  ];  or itching[  ]; Musculosketetal: myalgias[  ];  joint swelling[  ];  joint erythema[  ];  joint pain[  ];  back pain[  ];  Heme/Lymph: bruising[  ];  bleeding[  ];  anemia[  ];  Neuro: TIA[  ];  headaches[  ];  stroke[  ];  vertigo[  ];  seizures[  ];   paresthesias[  ];  difficulty walking[ y ];  Psych:depression[  ]; anxiety[  ];  Endocrine: diabetes[  ];  thyroid dysfunction[  ];  Immunizations: Flu up to date [ y ];  Pneumococcal up to date Blue.Reese  ];  Other:  Physical Exam: BP (!) 124/51   Pulse 93   Temp 97.7 F (36.5 C)   Resp 16   Wt 110 lb 6.4 oz (50.1 kg)   SpO2 98% Comment: on 4 L  BMI 20.86 kg/m    PHYSICAL EXAMINATION: General appearance: alert, cooperative, appears older than stated age and no distress Head: Normocephalic, without obvious abnormality, atraumatic Neck: no adenopathy, no carotid bruit, no JVD, supple, symmetrical, trachea midline and thyroid not enlarged, symmetric, no tenderness/mass/nodules Lymph nodes: Cervical, supraclavicular, and axillary nodes normal. Resp: diminished breath sounds bibasilar Back: symmetric, no curvature. ROM normal. No CVA tenderness. Cardio: regular rate and rhythm, S1, S2 normal, no murmur, click, rub or gallop GI: soft, non-tender; bowel sounds normal; no masses,  no organomegaly Extremities: extremities normal, atraumatic, no cyanosis or edema and Homans sign is negative, no sign of DVT Neurologic: Grossly normal  Diagnostic Studies & Laboratory data:     Recent Radiology Findings:   Nm Pet Image Initial (pi)  Skull Base To Thigh  Result Date: 05/07/2016 CLINICAL DATA:  Initial treatment strategy for bilateral pulmonary nodules. EXAM: NUCLEAR MEDICINE PET SKULL BASE TO THIGH TECHNIQUE: 5.4 mCi F-18 FDG was injected intravenously. Full-ring PET imaging was performed from the skull base to thigh after the radiotracer. CT data was obtained and used for attenuation correction and anatomic localization. FASTING BLOOD GLUCOSE:  Value: 112 mg/dl COMPARISON:  Chest CTA on 03/24/2016 and chest CT on 11/11/2011 FINDINGS: NECK No hypermetabolic lymph nodes in the neck. CHEST No hypermetabolic mediastinal or hilar nodes. Mild emphysema noted. Bilateral pleural effusions and bilateral lower lobe atelectasis have resolved since previous study. Spiculated pulmonary nodule in the left upper lobe measuring 12 mm on image 24/8 is new since 2013 exam and shows low-grade metabolic activity with SUV max of 1.3. Smaller spiculated pulmonary nodule in the right upper lobe on image 29/8 measures 9 mm. This is also new since 2013 and shows low-grade metabolic activity with SUV max of 1.1. A third 7 mm pulmonary nodule is seen in the medial right lower lobe on image 44/8. This is also new since 2013 and shows low-grade metabolic activity with SUV max of 1.8. ABDOMEN/PELVIS No abnormal hypermetabolic activity within the liver, pancreas, adrenal glands, or spleen. No hypermetabolic lymph nodes in the abdomen or pelvis. Aortic atherosclerosis noted. SKELETON No focal hypermetabolic activity to suggest skeletal metastasis. IMPRESSION: Three small spiculated pulmonary nodules in left upper lobe, right upper lobe, and right lower lobe show low-grade metabolic activity. Low-grade synchronous bronchogenic adenocarcinomas cannot be excluded. Consider tissue sampling or continued short-term followup by chest CT in 3 months. No evidence of thoracic nodal or distant metastatic disease. Mild emphysema. Aortic atherosclerosis noted. Electronically Signed    By: Earle Gell M.D.   On: 05/07/2016 14:41      I have independently reviewed the above  cath films and reviewed the findings with the  patient .    Recent Lab Findings: Lab Results  Component Value Date   WBC 7.7 05/08/2016   HGB 14.0 05/08/2016   HCT 44.0 05/08/2016   PLT 190 05/08/2016   GLUCOSE 109 05/08/2016   CHOL 184 01/11/2015   TRIG 60.0 01/11/2015   HDL 84.30 01/11/2015   LDLDIRECT 102.1 10/10/2010   LDLCALC 88 01/11/2015   ALT 19 05/08/2016   AST 37 (H) 05/08/2016   NA 138 05/08/2016   K 4.9 05/08/2016   CL 85 (L) 03/25/2016  CREATININE 0.8 05/08/2016   BUN 22.9 05/08/2016   CO2 37 (H) 05/08/2016   TSH 6.076 (H) 12/30/2015   INR 1.58 (H) 03/25/2016   HGBA1C 6.3 (H) 12/29/2015      Assessment / Plan:      1/ Three small spiculated pulmonary nodules in left upper lobe, right upper lobe, and right lower lobe show low-grade metabolic activity on PET   May represent  Low-grade synchronous bronchogenic adenocarcinomas. Each is small to biopsy. Will repeat ct scan in 3 months with Super d and consider Navigation bronchoscopy    I  spent 40 minutes counseling the patient face to face and 50% or more the  time was spent in counseling and coordination of care. The total time spent in the appointment was 60 minutes.  Grace Isaac MD      [place] [place] [address] Office [phone]   Beeper [phone]  05/12/2016 3:22 PM

## 2016-05-09 ENCOUNTER — Other Ambulatory Visit: Payer: Self-pay | Admitting: *Deleted

## 2016-05-09 ENCOUNTER — Telehealth: Payer: Self-pay | Admitting: Internal Medicine

## 2016-05-09 NOTE — Telephone Encounter (Signed)
spoke w/ pt confirmed oct apt times

## 2016-05-09 NOTE — Patient Outreach (Signed)
[facility]) Care Management  05/09/2016  Jasmine [name] [birth year] [id number]   Assessment: Care coordination call  Called and spoke with patient to reschedule appointment for a follow-up with her on Monday 7/24 to another date due to care management coordinator's office meeting that needs to be attended.  Patient was very flexible and accommodating stating that the next day - Tuesday July 25 works well with her. Offered patient for a routine home visit on Tuesday but she states that she would like to "stick with a call" as what she had preferred the last time. She states she is "doing pretty good" that a call will work for her instead of a home visit. Patient mentioned that she was able to attend her appointment with oncologist (Dr. Julien Nordmann) as scheduled and will have a follow-up visit with him on October.   Patient states her schedule is pretty much full since she is putting her house for sale or for rent and has to do all the leg work for its preparation. Patient was getting ready to go to dinner at this time so she was not able to give more updates.    Plan: Will call patient for follow-up on Tuesday July 25 as preferred.  Manolito Jurewicz A. Monzerat Handler, BSN, RN-BC [place] Management Coordinator Cell: [phone]

## 2016-05-12 ENCOUNTER — Encounter: Payer: Self-pay | Admitting: *Deleted

## 2016-05-12 ENCOUNTER — Ambulatory Visit: Payer: Self-pay | Admitting: *Deleted

## 2016-05-13 ENCOUNTER — Other Ambulatory Visit: Payer: Self-pay | Admitting: *Deleted

## 2016-05-13 ENCOUNTER — Ambulatory Visit: Payer: Commercial Managed Care - HMO | Admitting: Internal Medicine

## 2016-05-13 ENCOUNTER — Encounter: Payer: Self-pay | Admitting: *Deleted

## 2016-05-13 DIAGNOSIS — F321 Major depressive disorder, single episode, moderate: Secondary | ICD-10-CM | POA: Diagnosis not present

## 2016-05-13 DIAGNOSIS — F419 Anxiety disorder, unspecified: Secondary | ICD-10-CM | POA: Diagnosis not present

## 2016-05-13 DIAGNOSIS — I5032 Chronic diastolic (congestive) heart failure: Secondary | ICD-10-CM

## 2016-05-13 NOTE — Patient Outreach (Signed)
[facility]) Care Management  05/13/2016  Jasmine [name] [birth year] [id number]   Assessment: Follow-up Call Called and spoke with patient who reports that she is with a "talk therapist" at this time. Patient seemed to have scheduled overlapping times with different providers on this specific time.  Care management coordinator was able to speak with Jasmine [name] (psychotherapist) who shares that she sees patient regularly at a  weekly or every other week schedule to provide therapy sessions. She stated that this is an on-site service since they have  contracted with [name] to see their patients. She verbalized that she has 20 minutes more until the session is over and would  not like to waste patient's time.  Care management coordinator informed psychotherapist to let patient know that I will be calling back later in the day.  Plan: Will call patient back later in the day.    Addendum:  Called patient back and spoke to her briefly stating that the session had finished, but she is now getting ready to take a shower and be ready for lunch. Patient requested care management coordinator to call back around 1:30 pm.   Plan: Will give patient a call back this afternoon as requested.    Addendum:  Attempt #1 Call placed as requested by patient at our earlier conversation, but unable to reach her. HIPAA compliant voice message left with name and contact number.   Plan: Will await for patient's return call. If unable to receive a call back, will attempt to call her again later for follow-up.    Addendum: Attempt #2 Follow-up call placed to patient but still unable to reach her at this time. HIPAA compliant voice message left with name and contact information and request for a call back.   Plan: Will await for a return call from patient. If unable to receive a call back, will attempt another call before office hour ends today.    ADDENDUM: Call placed and spoke with  patient who was apologetic for not being able to be contacted on the time she had requested to be called back. Patient states she just got back to her room (left her phone in her room) from the dining room since she was talking to other residents and did not notice the time.  Patient informed care management coordinator that she will be attending another activity (movie) at 3 pm but was willing to talk on the phone prior to such activity.   According to patient, she is "doing pretty good" except that her breathing is "not as good as usual" which makes her consider herself to be quite towards the "yellow zone"-- related to her sinus issues. She states that her "nose and head feel stuffy", "left ear is stopped up" and has clear to light yellow nasal drainage. Care management coordinator encouraged patient to call provider to seek for help, which patient already did. Facility scheduler had obtained a schedule to see patient's primary care provider ([name]) tomorrow 7/26 at 9 am. Transportation provided by [employer].  Patient continues to weigh self daily and records readings, but no noted weight gain in the past 2 weeks that is greater than 3 pounds in a day or 5 pounds in a week. No increased swelling to bilateral lower extremities reported. She denies fever,cough, loss of appetite, chest pain, worsening shortness of breath or wheezing.   Patient still needs reinforcement on steps to manage HF and recognize signs and symptoms to be reported to provider for  help. Patient verbalizes overall compliance with treatment and medications and has been trying to follow diet restrictions. No changes in medications reported. She denies worsening loneliness or depression related to husband's loss and receives regular psychotherapy treatment sessions at [facility]  Patient states she was seen by oncologist (Dr. Julien Nordmann) and discussed PET scan results. Follow-up of lung nodules had been scheduled for October per  patient.  Jasmine [name] denies being able to think of any additional needs or concerns at this time. Needs had been provided for at [facility] as stated. She was agreeable to case closure from community care management standpoint with most goals met and she agreed to transition to [facility] for further disease management at this point.    Plan:  Will close case with most goals met from community care management standpoint, however, will transition patient to [facility] for further disease management. Will notify primary care provider of such.    Jamonta Goerner A. Latavion Halls, BSN, RN-BC [place] Management Coordinator Cell: [phone]

## 2016-05-14 ENCOUNTER — Ambulatory Visit (INDEPENDENT_AMBULATORY_CARE_PROVIDER_SITE_OTHER): Payer: Commercial Managed Care - HMO | Admitting: Internal Medicine

## 2016-05-14 ENCOUNTER — Telehealth: Payer: Self-pay | Admitting: *Deleted

## 2016-05-14 ENCOUNTER — Encounter: Payer: Self-pay | Admitting: *Deleted

## 2016-05-14 ENCOUNTER — Encounter: Payer: Self-pay | Admitting: Internal Medicine

## 2016-05-14 VITALS — BP 120/70 | HR 78 | Temp 98.0°F | Resp 18 | Wt 110.0 lb

## 2016-05-14 DIAGNOSIS — J438 Other emphysema: Secondary | ICD-10-CM | POA: Diagnosis not present

## 2016-05-14 DIAGNOSIS — J019 Acute sinusitis, unspecified: Secondary | ICD-10-CM | POA: Diagnosis not present

## 2016-05-14 DIAGNOSIS — F418 Other specified anxiety disorders: Secondary | ICD-10-CM

## 2016-05-14 MED ORDER — LEVOFLOXACIN 250 MG PO TABS: 250.0000 mg | ORAL_TABLET | Freq: Every day | ORAL | 0 refills | Status: DC

## 2016-05-14 MED ORDER — CITALOPRAM HYDROBROMIDE 20 MG PO TABS: 20.0000 mg | ORAL_TABLET | Freq: Every day | ORAL | 3 refills | Status: DC

## 2016-05-14 MED ORDER — HYDROCODONE-HOMATROPINE 5-1.5 MG/5ML PO SYRP: 5.0000 mL | ORAL_SOLUTION | Freq: Four times a day (QID) | ORAL | 0 refills | Status: DC | PRN

## 2016-05-14 NOTE — Telephone Encounter (Signed)
Oncology Nurse Navigator Documentation  Oncology Nurse Navigator Flowsheets 05/14/2016  Navigator Encounter Type Telephone  Telephone Outgoing Call  Treatment Phase Other  Barriers/Navigation Needs Education  Education Other  Interventions Education Method  Education Method Verbal  Acuity Level 1  Acuity Level 1 Minimal follow up required  Time Spent with Patient 15   I called Jasmine [name] today to follow up from thoracic clinic.  I was unable to reach her.  I did leave her a vm message with update on her appt with Dr. Julien Nordmann and that central scheduling will be calling to set up her CT scan in Oct.  I also left my name and phone number to call if needed.

## 2016-05-14 NOTE — Progress Notes (Signed)
Pre visit review using our clinic review tool, if applicable. No additional management support is needed unless otherwise documented below in the visit note. 

## 2016-05-14 NOTE — Progress Notes (Signed)
Subjective:    Patient ID: Jasmine [name], female    DOB: 01/15/1946, 70 y.o.   MRN: [id number]  HPI   Here with 2-3 days acute onset fever, facial pain, pressure, headache, general weakness and malaise, and greenish d/c, with mild ST and cough, but pt denies chest pain, wheezing, increased sob or doe, orthopnea, PND, increased LE swelling, palpitations, dizziness or syncope.  Has had mild worsening depressive symptoms, but no suicidal ideation, or panic; has ongoing anxiety as well.  Pt denies new neurological symptoms such as new headache, or facial or extremity weakness or numbness   Pt denies polydipsia, polyuria Past Medical History:  Diagnosis Date  . ALLERGIC RHINITIS 05/01/2007  . Anxiety   . Arthritis    "hands" (12/28/2015)  . ASTHMA 05/01/2007   "since I was a child"  . CHF (congestive heart failure) ([place])   . Chronic bronchitis ([place])   . DEPRESSION 07/21/2008  . DVT (deep venous thrombosis) ([facility]) 1960s   LLE  . FATIGUE 10/10/2010  . HYPERTENSION 05/01/2007  . HYPERTHYROIDISM 11/23/2007   Pt endorses having had Graves disease, possibly radioactive iodine x1, but no thyroidectomy  . HYPOTHYROIDISM 08/23/2009  . LUMBAR RADICULOPATHY, RIGHT 08/25/2008  . On home oxygen therapy    "3-4L; qd; all the time" (12/28/2015)  . OSTEOPOROSIS 05/01/2007  . SHOULDER PAIN, LEFT 08/23/2009  . SINUSITIS, CHRONIC 10/10/2010  . SINUSITIS- ACUTE-NOS 10/10/2008   Past Surgical History:  Procedure Laterality Date  . APPENDECTOMY    . DILATION AND CURETTAGE OF UTERUS  multiple   history of multiple dialations and curettages and miscarriages, unfortunately never carrying a child to term  . ECTOPIC PREGNANCY SURGERY  "late '60s or early '70s  . ELECTROCARDIOGRAM  06/20/2006  . OOPHORECTOMY Right     reports that she quit smoking about 9 months ago. Her smoking use included Cigarettes. She has a 54.00 pack-year smoking history. She has never used smokeless tobacco. She reports that she drinks alcohol.  She reports that she uses drugs, including "Crack" cocaine, about 2 times per week. family history includes Alcohol abuse in her brother; Diabetes in her brother; Hypertension in her other; Lung cancer in her father; Stroke in her mother; Thyroid disease in her mother. Allergies  Allergen Reactions  . Alendronate Sodium Other (See Comments)    Pt does not remember this reaction   Current Outpatient Prescriptions on File Prior to Visit  Medication Sig Dispense Refill  . albuterol (PROVENTIL HFA;VENTOLIN HFA) 108 (90 Base) MCG/ACT inhaler Inhale 2 puffs into the lungs every 6 (six) hours as needed for wheezing or shortness of breath. 1 Inhaler 3  . albuterol (PROVENTIL) (2.5 MG/3ML) 0.083% nebulizer solution Take 3 mLs (2.5 mg total) by nebulization every 4 (four) hours as needed for wheezing. 75 mL 12  . ALPRAZolam (XANAX) 0.25 MG tablet Take 1 tablet (0.25 mg total) by mouth 2 (two) times daily as needed for anxiety. 60 tablet 2  . aspirin EC 81 MG EC tablet Take 1 tablet (81 mg total) by mouth daily. 30 tablet 0  . feeding supplement, ENSURE ENLIVE, (ENSURE ENLIVE) LIQD Take 237 mLs by mouth 3 (three) times daily between meals. 237 mL 12  . fluticasone furoate-vilanterol (BREO ELLIPTA) 200-25 MCG/INH AEPB Inhale 1 puff into the lungs daily. 1 each 5  . furosemide (LASIX) 40 MG tablet Take 1 tablet (40 mg total) by mouth 2 (two) times daily. 30 tablet 3  . levothyroxine (SYNTHROID, LEVOTHROID) 75 MCG tablet Take  1 tablet (75 mcg total) by mouth daily before breakfast. 30 tablet 0  . OXYGEN Inhale 4 L into the lungs continuous.     . potassium chloride (K-DUR,KLOR-CON) 10 MEQ tablet Take 10 mEq by mouth 2 (two) times daily.    [name] tiotropium (SPIRIVA HANDIHALER) 18 MCG inhalation capsule Place 1 capsule (18 mcg total) into inhaler and inhale daily. 30 capsule 2   No current facility-administered medications on file prior to visit.    Review of Systems  Constitutional: Negative for unusual  diaphoresis or night sweats HENT: Negative for ear swelling or discharge Eyes: Negative for worsening visual haziness  Respiratory: Negative for choking and stridor.   Gastrointestinal: Negative for distension or worsening eructation Genitourinary: Negative for retention or change in urine volume.  Musculoskeletal: Negative for other MSK pain or swelling Skin: Negative for color change and worsening wound Neurological: Negative for tremors and numbness other than noted  Psychiatric/Behavioral: Negative for decreased concentration or agitation other than above       Objective:   Physical Exam BP 120/70   Pulse 78   Temp 98 F (36.7 C) (Oral)   Resp 18   Wt 110 lb (49.9 kg)   SpO2 94%   BMI 20.78 kg/m  VS noted, mild ill Constitutional: Pt appears in no apparent distress HENT: Head: NCAT.  Right Ear: External ear normal.  Left Ear: External ear normal.  Bilat tm's with mild erythema.  Max sinus areas mild tender.  Pharynx with mild erythema, no exudate Eyes: . Pupils are equal, round, and reactive to light. Conjunctivae and EOM are normal Neck: Normal range of motion. Neck supple.  Cardiovascular: Normal rate and regular rhythm.   Pulmonary/Chest: Effort normal and breath sounds decreased bilat without rales or wheezing.  Neurological: Pt is alert. Not confused , motor grossly intact Skin: Skin is warm. No rash, no LE edema Psychiatric: Pt behavior is normal. No agitation. + depreessed affect    Assessment & Plan:

## 2016-05-14 NOTE — Progress Notes (Signed)
Pl see above

## 2016-05-14 NOTE — Patient Instructions (Signed)
Please take all new medication as prescribed - the antibiotic, cough medicine   You can also take Mucinex (or it's generic off brand) for congestion, and tylenol as needed for pain.  OK to increase the celexa to 20 mg per day  Please continue all other medications as before, and refills have been done if requested.  Please have the pharmacy call with any other refills you may need.  Please keep your appointments with your specialists as you may have planned

## 2016-05-15 ENCOUNTER — Encounter: Payer: Self-pay | Admitting: Internal Medicine

## 2016-05-15 ENCOUNTER — Telehealth: Payer: Self-pay | Admitting: *Deleted

## 2016-05-15 NOTE — Progress Notes (Signed)
Message left on my vmail in error. I let Hinton Dyer know patient called back.

## 2016-05-15 NOTE — Telephone Encounter (Signed)
Oncology Nurse Navigator Documentation  Oncology Nurse Navigator Flowsheets 05/14/2016  Navigator Encounter Type Telephone  Telephone Outgoing Call  Treatment Phase Other  Barriers/Navigation Needs Education  Education Other  Interventions Education Method  Education Method Verbal  Acuity Level 1  Acuity Level 1 Minimal follow up required  Time Spent with Patient 15   I received notification that patient needed to speak with me.  I called to follow up.  She stated she had a cough and she called her PCP for medicine.  I listened as she explained.  I updated her she did the right thing by calling PCP.

## 2016-05-18 NOTE — Assessment & Plan Note (Signed)
With mild recent worsening, for increased celexa 20 qd,  to f/u any worsening symptoms or concerns

## 2016-05-18 NOTE — Assessment & Plan Note (Signed)
Copd with chronic resp hypoxic failure- stable, cont same tx.  to f/u any worsening symptoms or concerns

## 2016-05-18 NOTE — Assessment & Plan Note (Signed)
Mild to mod, for antibx course,  to f/u any worsening symptoms or concerns 

## 2016-05-19 ENCOUNTER — Other Ambulatory Visit: Payer: Self-pay

## 2016-05-19 DIAGNOSIS — F419 Anxiety disorder, unspecified: Secondary | ICD-10-CM | POA: Diagnosis not present

## 2016-05-19 DIAGNOSIS — F321 Major depressive disorder, single episode, moderate: Secondary | ICD-10-CM | POA: Diagnosis not present

## 2016-05-19 NOTE — Patient Outreach (Signed)
[facility]) Care Management  05/19/2016  TERRYL CREEKMORE June 24, 1946 [id number]   Telephone call to for introductory call.  Discussed with health coach and role.  Patient receptive to call.    Plan: RN Health Coach will contact patient in the month of August and patient agrees to next outreach.    [name], RN, MSN [place] [phone]

## 2016-05-20 ENCOUNTER — Emergency Department (HOSPITAL_COMMUNITY): Payer: Commercial Managed Care - HMO

## 2016-05-20 ENCOUNTER — Encounter (HOSPITAL_COMMUNITY): Payer: Self-pay | Admitting: Emergency Medicine

## 2016-05-20 ENCOUNTER — Inpatient Hospital Stay (HOSPITAL_COMMUNITY)
Admission: EM | Admit: 2016-05-20 | Discharge: 2016-05-23 | DRG: 190 | Disposition: A | Discharge status: Nursing Home (Includes ICF) | Payer: Commercial Managed Care - HMO | Attending: Family Medicine | Admitting: Family Medicine

## 2016-05-20 DIAGNOSIS — Y95 Nosocomial condition: Secondary | ICD-10-CM | POA: Diagnosis present

## 2016-05-20 DIAGNOSIS — J9621 Acute and chronic respiratory failure with hypoxia: Secondary | ICD-10-CM | POA: Diagnosis present

## 2016-05-20 DIAGNOSIS — R0602 Shortness of breath: Secondary | ICD-10-CM | POA: Diagnosis present

## 2016-05-20 DIAGNOSIS — Z9981 Dependence on supplemental oxygen: Secondary | ICD-10-CM | POA: Diagnosis not present

## 2016-05-20 DIAGNOSIS — R918 Other nonspecific abnormal finding of lung field: Secondary | ICD-10-CM | POA: Diagnosis present

## 2016-05-20 DIAGNOSIS — Z7982 Long term (current) use of aspirin: Secondary | ICD-10-CM

## 2016-05-20 DIAGNOSIS — Z8249 Family history of ischemic heart disease and other diseases of the circulatory system: Secondary | ICD-10-CM | POA: Diagnosis not present

## 2016-05-20 DIAGNOSIS — I110 Hypertensive heart disease with heart failure: Secondary | ICD-10-CM | POA: Diagnosis present

## 2016-05-20 DIAGNOSIS — Z888 Allergy status to other drugs, medicaments and biological substances status: Secondary | ICD-10-CM | POA: Diagnosis not present

## 2016-05-20 DIAGNOSIS — Z823 Family history of stroke: Secondary | ICD-10-CM

## 2016-05-20 DIAGNOSIS — E871 Hypo-osmolality and hyponatremia: Secondary | ICD-10-CM | POA: Diagnosis present

## 2016-05-20 DIAGNOSIS — M810 Age-related osteoporosis without current pathological fracture: Secondary | ICD-10-CM | POA: Diagnosis present

## 2016-05-20 DIAGNOSIS — Z87891 Personal history of nicotine dependence: Secondary | ICD-10-CM | POA: Diagnosis not present

## 2016-05-20 DIAGNOSIS — F419 Anxiety disorder, unspecified: Secondary | ICD-10-CM | POA: Diagnosis present

## 2016-05-20 DIAGNOSIS — J189 Pneumonia, unspecified organism: Secondary | ICD-10-CM | POA: Diagnosis present

## 2016-05-20 DIAGNOSIS — Z79899 Other long term (current) drug therapy: Secondary | ICD-10-CM

## 2016-05-20 DIAGNOSIS — Z833 Family history of diabetes mellitus: Secondary | ICD-10-CM | POA: Diagnosis not present

## 2016-05-20 DIAGNOSIS — I5032 Chronic diastolic (congestive) heart failure: Secondary | ICD-10-CM | POA: Diagnosis present

## 2016-05-20 DIAGNOSIS — R05 Cough: Secondary | ICD-10-CM | POA: Diagnosis not present

## 2016-05-20 DIAGNOSIS — E890 Postprocedural hypothyroidism: Secondary | ICD-10-CM | POA: Diagnosis present

## 2016-05-20 DIAGNOSIS — R911 Solitary pulmonary nodule: Secondary | ICD-10-CM | POA: Diagnosis not present

## 2016-05-20 DIAGNOSIS — R7303 Prediabetes: Secondary | ICD-10-CM | POA: Diagnosis present

## 2016-05-20 DIAGNOSIS — Z801 Family history of malignant neoplasm of trachea, bronchus and lung: Secondary | ICD-10-CM | POA: Diagnosis not present

## 2016-05-20 DIAGNOSIS — J438 Other emphysema: Secondary | ICD-10-CM

## 2016-05-20 DIAGNOSIS — R0902 Hypoxemia: Secondary | ICD-10-CM | POA: Diagnosis not present

## 2016-05-20 DIAGNOSIS — F17200 Nicotine dependence, unspecified, uncomplicated: Secondary | ICD-10-CM | POA: Diagnosis present

## 2016-05-20 DIAGNOSIS — J9622 Acute and chronic respiratory failure with hypercapnia: Secondary | ICD-10-CM | POA: Diagnosis present

## 2016-05-20 DIAGNOSIS — F329 Major depressive disorder, single episode, unspecified: Secondary | ICD-10-CM | POA: Diagnosis present

## 2016-05-20 DIAGNOSIS — R739 Hyperglycemia, unspecified: Secondary | ICD-10-CM | POA: Diagnosis present

## 2016-05-20 DIAGNOSIS — Z85118 Personal history of other malignant neoplasm of bronchus and lung: Secondary | ICD-10-CM

## 2016-05-20 DIAGNOSIS — Z825 Family history of asthma and other chronic lower respiratory diseases: Secondary | ICD-10-CM | POA: Diagnosis not present

## 2016-05-20 DIAGNOSIS — R079 Chest pain, unspecified: Secondary | ICD-10-CM | POA: Diagnosis not present

## 2016-05-20 DIAGNOSIS — J440 Chronic obstructive pulmonary disease with acute lower respiratory infection: Secondary | ICD-10-CM | POA: Diagnosis present

## 2016-05-20 DIAGNOSIS — J449 Chronic obstructive pulmonary disease, unspecified: Secondary | ICD-10-CM | POA: Diagnosis present

## 2016-05-20 DIAGNOSIS — Z86718 Personal history of other venous thrombosis and embolism: Secondary | ICD-10-CM | POA: Diagnosis not present

## 2016-05-20 DIAGNOSIS — I10 Essential (primary) hypertension: Secondary | ICD-10-CM

## 2016-05-20 HISTORY — DX: Chronic obstructive pulmonary disease, unspecified: J44.9

## 2016-05-20 HISTORY — DX: Malignant (primary) neoplasm, unspecified: C80.1

## 2016-05-20 LAB — CBC WITH DIFFERENTIAL/PLATELET
BASOS ABS: 0 10*3/uL (ref 0.0–0.1)
Basophils Relative: 0 %
EOS PCT: 4 %
Eosinophils Absolute: 0.4 10*3/uL (ref 0.0–0.7)
HEMATOCRIT: 42.4 % (ref 36.0–46.0)
Hemoglobin: 13.4 g/dL (ref 12.0–15.0)
LYMPHS PCT: 9 %
Lymphs Abs: 1 10*3/uL (ref 0.7–4.0)
MCH: 29 pg (ref 26.0–34.0)
MCHC: 31.6 g/dL (ref 30.0–36.0)
MCV: 91.8 fL (ref 78.0–100.0)
MONO ABS: 1.3 10*3/uL — AB (ref 0.1–1.0)
MONOS PCT: 13 %
NEUTROS ABS: 7.5 10*3/uL (ref 1.7–7.7)
Neutrophils Relative %: 74 %
PLATELETS: 256 10*3/uL (ref 150–400)
RBC: 4.62 MIL/uL (ref 3.87–5.11)
RDW: 17.3 % — AB (ref 11.5–15.5)
WBC: 10.1 10*3/uL (ref 4.0–10.5)

## 2016-05-20 LAB — I-STAT TROPONIN, ED: Troponin i, poc: 0.01 ng/mL (ref 0.00–0.08)

## 2016-05-20 LAB — BLOOD GAS, ARTERIAL
ACID-BASE EXCESS: 11.9 mmol/L — AB (ref 0.0–2.0)
Bicarbonate: 40.4 mEq/L — ABNORMAL HIGH (ref 20.0–24.0)
DRAWN BY: 422461
FIO2: 1
O2 CONTENT: 15 L/min
O2 Saturation: 98.2 %
PO2 ART: 130 mmHg — AB (ref 80.0–100.0)
Patient temperature: 97.7
TCO2: 36.5 mmol/L (ref 0–100)
pCO2 arterial: 73.3 mmHg (ref 35.0–45.0)
pH, Arterial: 7.357 (ref 7.350–7.450)

## 2016-05-20 LAB — BASIC METABOLIC PANEL
Anion gap: 7 (ref 5–15)
BUN: 17 mg/dL (ref 6–20)
CHLORIDE: 86 mmol/L — AB (ref 101–111)
CO2: 41 mmol/L — ABNORMAL HIGH (ref 22–32)
CREATININE: 0.95 mg/dL (ref 0.44–1.00)
Calcium: 9.2 mg/dL (ref 8.9–10.3)
GFR calc Af Amer: >60 mL/min (ref >60)
GFR calc non Af Amer: 59 mL/min — ABNORMAL LOW (ref >60)
GLUCOSE: 121 mg/dL — AB (ref 65–99)
Potassium: 4.3 mmol/L (ref 3.5–5.1)
SODIUM: 134 mmol/L — AB (ref 135–145)

## 2016-05-20 LAB — BRAIN NATRIURETIC PEPTIDE: B NATRIURETIC PEPTIDE 5: 20.5 pg/mL (ref 0.0–100.0)

## 2016-05-20 LAB — I-STAT CG4 LACTIC ACID, ED: LACTIC ACID, VENOUS: 0.95 mmol/L (ref 0.5–1.9)

## 2016-05-20 MED ORDER — VANCOMYCIN HCL IN DEXTROSE 1-5 GM/200ML-% IV SOLN
1000.0000 mg | Freq: Once | INTRAVENOUS | Status: AC
  Administered 2016-05-20: 1000 mg via INTRAVENOUS
  Filled 2016-05-20: qty 200

## 2016-05-20 MED ORDER — DEXTROSE 5 % IV SOLN
1.0000 g | Freq: Once | INTRAVENOUS | Status: AC
  Administered 2016-05-20: 1 g via INTRAVENOUS
  Filled 2016-05-20: qty 1

## 2016-05-20 MED ORDER — IOPAMIDOL (ISOVUE-370) INJECTION 76%
100.0000 mL | Freq: Once | INTRAVENOUS | Status: AC | PRN
  Administered 2016-05-20: 100 mL via INTRAVENOUS

## 2016-05-20 MED ORDER — VANCOMYCIN HCL IN DEXTROSE 1-5 GM/200ML-% IV SOLN
1000.0000 mg | INTRAVENOUS | Status: DC
  Administered 2016-05-21: 1000 mg via INTRAVENOUS
  Filled 2016-05-20: qty 200

## 2016-05-20 MED ORDER — ALBUTEROL SULFATE (2.5 MG/3ML) 0.083% IN NEBU
5.0000 mg | INHALATION_SOLUTION | Freq: Once | RESPIRATORY_TRACT | Status: AC
  Administered 2016-05-20: 5 mg via RESPIRATORY_TRACT
  Filled 2016-05-20: qty 6

## 2016-05-20 NOTE — Progress Notes (Signed)
Pt transported to CT scan while on BiPAP.  Pt remained stable and comfortable throughout the trip.

## 2016-05-20 NOTE — ED Notes (Signed)
PA at bedside.

## 2016-05-20 NOTE — ED Triage Notes (Signed)
Per EMS pt sent from [facility] on [place] for evaluation of increasing shortness of breath over last 3 days. Pt reports productive green cough for last several days. EMS reported oxygen saturation of 68% on 4 L of Oxygen. Pt was placed onto 15L O2 via NRB by EMS and oxygen saturation improved to 98%. Pt reports daily O2 use of 3-4L via [place]. Pt currently alert and oriented x 4. 20ga to L AC established by EMS. Pt oxygen saturation decreases when taken off of 15L NRB

## 2016-05-20 NOTE — ED Notes (Signed)
Attempted to wean pt off of NRB and onto [place] at 4L but oxygen saturation maintained at 82%. Pt placed back on 15L NRB and oxygen level increased to 98%. Provider notified

## 2016-05-20 NOTE — ED Notes (Signed)
Bed: WA04 Expected date:  Expected time:  Means of arrival:  Comments: EMS female from SNF/[facility]-hx lung cancer-sat 67%/100% NRB/chronic back pain

## 2016-05-20 NOTE — ED Provider Notes (Signed)
[facility] DEPT Provider Note   CSN: [id number] Arrival date & time: 05/20/16  1956  First Provider Contact:  First MD Initiated Contact with Patient 05/20/16 2035        History   Chief Complaint Chief Complaint  Patient presents with  . Shortness of Breath    HPI Jasmine [name] is a 70 y.o. female.  Jasmine [name] is a 70 y.o. Female who presents to the emergency department from assisted living for evaluation of increasing shortness of breath over the past 3 days. Patient reports she's been having some increased shortness of breath and right-sided chest pain over the past 3 days. Patient is on oxygen at home. EMS found her to be at 68% on 4 L of oxygen on arrival. Patient oxygen saturation improved to 100% on nonrebreather. Patient currently complains of right-sided chest pain. No chest tightness or wheezing currently. She is currently being worked up for bilateral pulmonary nodules. Oncologist Dr. Julien Nordmann. No active treatment currently. She has a history of CHF and COPD. She also has a long history of smoking and substance abuse. Patient reports she has left ankle edema chronically. She does have a remote history of a left lower extremity DVT. She is not on anticoagulants. She reports is not worsened or changed. Patient denies fevers, hemoptysis, syncope, abdominal pain, nausea, vomiting or rashes.   The history is provided by the patient and medical records. No language interpreter was used.  Shortness of Breath  Associated symptoms include chest pain and coughing. Pertinent negatives include no abdominal pain, chills, congestion, fever, headaches, nausea, neck pain, rash, sore throat or vomiting.    Past Medical History:  Diagnosis Date  . ALLERGIC RHINITIS 05/01/2007  . Anxiety   . Arthritis    "hands" (12/28/2015)  . ASTHMA 05/01/2007   "since I was a child"  . Cancer (HCC)    LUNG  . CHF (congestive heart failure) ([place])   . Chronic bronchitis ([place])   . COPD (chronic  obstructive pulmonary disease) ([place])   . DEPRESSION 07/21/2008  . DVT (deep venous thrombosis) (HCC) 1960s   LLE  . FATIGUE 10/10/2010  . HYPERTENSION 05/01/2007  . HYPERTHYROIDISM 11/23/2007   Pt endorses having had Graves disease, possibly radioactive iodine x1, but no thyroidectomy  . HYPOTHYROIDISM 08/23/2009  . LUMBAR RADICULOPATHY, RIGHT 08/25/2008  . On home oxygen therapy    "3-4L; qd; all the time" (12/28/2015)  . OSTEOPOROSIS 05/01/2007  . SHOULDER PAIN, LEFT 08/23/2009  . SINUSITIS, CHRONIC 10/10/2010    Patient Active Problem List   Diagnosis Date Noted  . HCAP (healthcare-associated pneumonia) 05/21/2016  . Lung mass 03/24/2016  . Abnormal LFTs 03/24/2016  . Elevated blood sugar 03/24/2016  . Acute on chronic respiratory failure with hypoxemia ([place])   . Bilateral pleural effusion   . Solitary pulmonary nodule 01/09/2016  . Chronic respiratory failure with hypoxia ([place])   . On home oxygen therapy   . Chronic diastolic congestive heart failure ([place])   . Staphylococcus aureus bacteremia 12/28/2015  . Bacteremia Step viridans 12/27/2015  . Hypomagnesemia 12/25/2015  . Acute on chronic respiratory failure ([place]) 12/25/2015  . Tobacco abuse disorder 12/25/2015  . Hypothyroidism following radioiodine therapy 09/26/2014  . Protein-calorie malnutrition, severe ([place]) 06/23/2014  . Hypercarbia 06/20/2014  . Cor pulmonale ([place]) 01/12/2013  . Smoker 01/12/2013  . COPD (chronic obstructive pulmonary disease) ([place]) 11/29/2011  . Macrocytosis without anemia 11/09/2011  . SINUSITIS, CHRONIC 10/10/2010  . FATIGUE 10/10/2010  . Acute sinus infection  10/10/2008  . Depression with anxiety 07/21/2008  . Essential hypertension 05/01/2007  . ALLERGIC RHINITIS 05/01/2007  . ASTHMA 05/01/2007  . OSTEOPOROSIS 05/01/2007    Past Surgical History:  Procedure Laterality Date  . APPENDECTOMY    . DILATION AND CURETTAGE OF UTERUS  multiple   history of multiple dialations and curettages and  miscarriages, unfortunately never carrying a child to term  . ECTOPIC PREGNANCY SURGERY  "late '60s or early '70s  . ELECTROCARDIOGRAM  06/20/2006  . OOPHORECTOMY Right     OB History    Gravida Para Term Preterm AB Living   5       5     SAB TAB Ectopic Multiple Live Births   4   1           Home Medications    Prior to Admission medications   Medication Sig Start Date End Date Taking? Authorizing Provider  albuterol (PROVENTIL HFA;VENTOLIN HFA) 108 (90 Base) MCG/ACT inhaler Inhale 2 puffs into the lungs every 6 (six) hours as needed for wheezing or shortness of breath. 02/20/16  Yes Rigoberto Noel, MD  albuterol (PROVENTIL) (2.5 MG/3ML) 0.083% nebulizer solution Take 3 mLs (2.5 mg total) by nebulization every 4 (four) hours as needed for wheezing. 06/25/14  Yes Theodis Blaze, MD  ALPRAZolam Duanne Moron) 0.25 MG tablet Take 1 tablet (0.25 mg total) by mouth 2 (two) times daily as needed for anxiety. 04/24/16  Yes Biagio Borg, MD  aspirin EC 81 MG EC tablet Take 1 tablet (81 mg total) by mouth daily. 03/27/16  Yes Velvet Bathe, MD  citalopram (CELEXA) 20 MG tablet Take 1 tablet (20 mg total) by mouth daily. 05/14/16  Yes Biagio Borg, MD  feeding supplement, ENSURE ENLIVE, (ENSURE ENLIVE) LIQD Take 237 mLs by mouth 3 (three) times daily between meals. 12/31/15  Yes Florencia Reasons, MD  fluticasone furoate-vilanterol (BREO ELLIPTA) 200-25 MCG/INH AEPB Inhale 1 puff into the lungs daily. 12/21/15  Yes Rigoberto Noel, MD  furosemide (LASIX) 40 MG tablet Take 1 tablet (40 mg total) by mouth 2 (two) times daily. 04/24/16  Yes Biagio Borg, MD  HYDROcodone-homatropine [facility]) 5-1.5 MG/5ML syrup Take 5 mLs by mouth every 6 (six) hours as needed for cough. 05/14/16 05/24/16 Yes Biagio Borg, MD  levothyroxine (SYNTHROID, LEVOTHROID) 75 MCG tablet Take 1 tablet (75 mcg total) by mouth daily before breakfast. 12/31/15  Yes Florencia Reasons, MD  potassium chloride (K-DUR,KLOR-CON) 10 MEQ tablet Take 10 mEq by mouth 2 (two) times daily.    Yes Historical Provider, MD  tiotropium (SPIRIVA HANDIHALER) 18 MCG inhalation capsule Place 1 capsule (18 mcg total) into inhaler and inhale daily. 12/27/15  Yes Ripudeep Krystal Eaton, MD  OXYGEN Inhale 4 L into the lungs continuous.     Historical Provider, MD    Family History Family History  Problem Relation Age of Onset  . Lung cancer Father   . Alcohol abuse Brother   . Diabetes Brother   . Hypertension Other   . Stroke Mother   . Thyroid disease Mother   . Asthma      maternal aunts    Social History Social History  Substance Use Topics  . Smoking status: Former Smoker    Packs/day: 1.00    Years: 54.00    Types: Cigarettes    Quit date: 07/21/2015  . Smokeless tobacco: Never Used  . Alcohol use Yes     Comment: 12/28/2015 "might drink a beer/month"  Allergies   Alendronate sodium   Review of Systems Review of Systems  Constitutional: Negative for chills and fever.  HENT: Negative for congestion and sore throat.   Eyes: Negative for visual disturbance.  Respiratory: Positive for cough and shortness of breath. Negative for wheezing.   Cardiovascular: Positive for chest pain and leg swelling (chronic ). Negative for palpitations.  Gastrointestinal: Negative for abdominal pain, diarrhea, nausea and vomiting.  Genitourinary: Negative for dysuria.  Musculoskeletal: Negative for back pain and neck pain.  Skin: Negative for rash and wound.  Neurological: Negative for syncope, light-headedness and headaches.     Physical Exam Updated Vital Signs BP 113/69   Pulse 70   Temp 97.7 F (36.5 C) (Oral)   Resp 15   Ht 5' (1.524 m)   Wt 50.3 kg   SpO2 100%   BMI 21.66 kg/m   Physical Exam  Constitutional: She appears well-developed and well-nourished. No distress.  Nontoxic appearing.  HENT:  Head: Normocephalic and atraumatic.  Right Ear: External ear normal.  Left Ear: External ear normal.  Mouth/Throat: Oropharynx is clear and moist.  Eyes: Conjunctivae are  normal. Pupils are equal, round, and reactive to light. Right eye exhibits no discharge. Left eye exhibits no discharge.  Neck: Neck supple. No JVD present.  Cardiovascular: Normal rate, regular rhythm, normal heart sounds and intact distal pulses.  Exam reveals no gallop and no friction rub.   Pulmonary/Chest: Effort normal and breath sounds normal. No stridor. No respiratory distress. She has no wheezes. She has no rales.  Diminished lung sounds are bilateral bases. No increased work of breathing. Oxygen saturation 100% on 15 L via nonrebreather. Speech is clear and coherent. Speaking in complete sentences.  Abdominal: Soft. There is no tenderness.  Musculoskeletal: She exhibits edema. She exhibits no deformity.  Left ankle edema which the patient reports is chronic. No calf edema or tenderness bilaterally.  Lymphadenopathy:    She has no cervical adenopathy.  Neurological: She is alert. Coordination normal.  Skin: Skin is warm and dry. Capillary refill takes less than 2 seconds. No rash noted. She is not diaphoretic. No erythema. No pallor.  Psychiatric: She has a normal mood and affect. Her behavior is normal.  Nursing note and vitals reviewed.    ED Treatments / Results  Labs (all labs ordered are listed, but only abnormal results are displayed) Labs Reviewed  BASIC METABOLIC PANEL - Abnormal; Notable for the following:       Result Value   Sodium 134 (*)    Chloride 86 (*)    CO2 41 (*)    Glucose, Bld 121 (*)    GFR calc non Af Amer 59 (*)    All other components within normal limits  CBC WITH DIFFERENTIAL/PLATELET - Abnormal; Notable for the following:    RDW 17.3 (*)    Monocytes Absolute 1.3 (*)    All other components within normal limits  BLOOD GAS, ARTERIAL - Abnormal; Notable for the following:    pCO2 arterial 73.3 (*)    pO2, Arterial 130 (*)    Bicarbonate 40.4 (*)    Acid-Base Excess 11.9 (*)    All other components within normal limits  CULTURE, BLOOD  (ROUTINE X 2)  CULTURE, BLOOD (ROUTINE X 2)  CULTURE, BLOOD (ROUTINE X 2)  CULTURE, BLOOD (ROUTINE X 2)  CULTURE, EXPECTORATED SPUTUM-ASSESSMENT  GRAM STAIN  MRSA PCR SCREENING  BRAIN NATRIURETIC PEPTIDE  HIV ANTIBODY (ROUTINE TESTING)  LEGIONELLA PNEUMOPHILA SEROGP 1 UR AG  STREP PNEUMONIAE URINARY ANTIGEN  BASIC METABOLIC PANEL  CBC WITH DIFFERENTIAL/PLATELET  I-STAT TROPOININ, ED  I-STAT CG4 LACTIC ACID, ED    EKG  EKG Interpretation None       Radiology Ct Angio Chest Pe W/cm &/or Wo Cm  Result Date: 05/20/2016 CLINICAL DATA:  Increasing shortness of breath. Possible lung cancer. History of asthma, tobacco abuse. EXAM: CT ANGIOGRAPHY CHEST WITH CONTRAST TECHNIQUE: Multidetector CT imaging of the chest was performed using the standard protocol during bolus administration of intravenous contrast. Multiplanar CT image reconstructions and MIPs were obtained to evaluate the vascular anatomy. CONTRAST:  100 cc Isovue 370 COMPARISON:  PET- CT May 07, 2016 and CT chest March 24, 2016 FINDINGS: PULMONARY ARTERY: Adequate contrast opacification of the pulmonary artery's. Main pulmonary artery is not enlarged. No pulmonary arterial filling defects to the level of the subsegmental branches. MEDIASTINUM: Heart and pericardium are unremarkable, no right heart strain. Mild coronary artery calcifications. Thoracic aorta is normal course and caliber, unremarkable. Stable mild RIGHT hilar lymphadenopathy, not hypermetabolic on prior PET-CT. LUNGS: Debris within bilateral lower lobe bronchi. No pneumothorax. Hypo enhancing dense consolidation RIGHT upper lobe, posterior segment. Small RIGHT pleural effusion. Resolution of LEFT pleural effusion. Bilateral lower lobe enhancing atelectasis. Stable 10 mm RIGHT upper lobe spiculated nodule. Smaller less consolidated appearing LEFT upper lobe 10 mm nodule. Mild centrilobular emphysema and increased lung volumes. Diffuse coarsened pulmonary interstitium. SOFT  TISSUES AND OSSEOUS STRUCTURES: Included view of the abdomen is nonacute. Please note, adrenal glands not imaged. Visualized soft tissues and included osseous structures appear nonsuspicious. Punctate RIGHT breast calcification. Review of the MIP images confirms the above findings. IMPRESSION: RIGHT upper lobe pneumonia.  Small RIGHT pleural effusion. Stable 9 mm spiculated RIGHT upper lobe pulmonary nodule. Decreased size and cons acuity of the LEFT upper lobe 10 mm pulmonary nodule. Debris within lower lobe bronchi concerning for aspiration. Electronically Signed   By: Elon Alas M.D.   On: 05/20/2016 23:48   Dg Chest Port 1 View  Result Date: 05/20/2016 CLINICAL DATA:  Shortness of breath, onset today. EXAM: PORTABLE CHEST 1 VIEW COMPARISON:  PET-CT 05/07/2016 reviewed. Most recent radiographs 03/26/2016 FINDINGS: No opacity in the right midlung zone likely localizing to the anterior right upper lobe from recent prior PET-CT. The small spiculated nodules in both lungs are not well seen radiographically. Background emphysema. Minimal blunting of right costophrenic angle, may be small fusion, incompletely occluded in the field of view. Unchanged mild cardiomegaly and unchanged mediastinal contours from prior exam. No pneumothorax. No evidence pulmonary edema. IMPRESSION: 1. New right mid lung zone opacity likely in the anterior upper lobe, suspicious for pneumonia. 2. The small bilateral pulmonary nodules on recent PET-CT are not well seen radiographically. 3. Stable cardiomegaly.  Emphysema. Electronically Signed   By: Jeb Levering M.D.   On: 05/20/2016 21:20    Procedures Procedures (including critical care time)  Medications Ordered in ED Medications  vancomycin (VANCOCIN) IVPB 1000 mg/200 mL premix (not administered)  citalopram (CELEXA) tablet 20 mg (not administered)  furosemide (LASIX) tablet 40 mg (not administered)  ALPRAZolam (XANAX) tablet 0.25 mg (not administered)  aspirin EC  tablet 81 mg (not administered)  potassium chloride (K-DUR,KLOR-CON) CR tablet 10 mEq (not administered)  feeding supplement (ENSURE ENLIVE) (ENSURE ENLIVE) liquid 237 mL (not administered)  levothyroxine (SYNTHROID, LEVOTHROID) tablet 75 mcg (not administered)  fluticasone furoate-vilanterol (BREO ELLIPTA) 200-25 MCG/INH 1 puff (not administered)  albuterol (PROVENTIL) (2.5 MG/3ML) 0.083% nebulizer solution 2.5 mg (not administered)  enoxaparin (LOVENOX) injection 40 mg (not administered)  ceFEPIme (MAXIPIME) 1 g in dextrose 5 % 50 mL IVPB (not administered)  chlorhexidine (PERIDEX) 0.12 % solution 15 mL (not administered)  antiseptic oral rinse (CPC / CETYLPYRIDINIUM CHLORIDE 0.05%) solution 7 mL (not administered)  albuterol (PROVENTIL) (2.5 MG/3ML) 0.083% nebulizer solution 5 mg (5 mg Nebulization Given 05/20/16 2047)  ceFEPIme (MAXIPIME) 1 g in dextrose 5 % 50 mL IVPB (0 g Intravenous Stopped 05/20/16 2240)  vancomycin (VANCOCIN) IVPB 1000 mg/200 mL premix (0 mg Intravenous Stopped 05/20/16 2356)  iopamidol (ISOVUE-370) 76 % injection 100 mL (100 mLs Intravenous Contrast Given 05/20/16 2320)     Initial Impression / Assessment and Plan / ED Course  I have reviewed the triage vital signs and the nursing notes.  Pertinent labs & imaging results that were available during my care of the patient were reviewed by me and considered in my medical decision making (see chart for details).  Clinical Course   This  is a 70 y.o. Female who presents to the emergency department from assisted living for evaluation of increasing shortness of breath over the past 3 days. Patient reports she's been having some increased shortness of breath and right-sided chest pain over the past 3 days. Patient is on oxygen at home. EMS found her to be at 68% on 4 L of oxygen on arrival. Patient oxygen saturation improved to 100% on nonrebreather. Patient currently complains of right-sided chest pain. No chest tightness or  wheezing currently. She is currently being worked up for bilateral pulmonary nodules. Oncologist Dr. Julien Nordmann. No active treatment currently. She has a history of CHF and COPD. She also has a long history of smoking and substance abuse.  I was called to bedside by nursing staff as they had placed her on a nonrebreather. On exam patient appears comfortable on nonrebreather. Oxygen saturation is 100% on nonrebreather. She is afebrile and nontoxic appearing. She is able to speak in complete sentences. She does have diminished lung sounds to her bilateral bases.  Chest x-ray shows right upper lobe pneumonia. Patient started on HCAP abx. Blood cultures ordered. Lactic is not elevated. Plan is still for CT PE study to rule out PE with her history of lung cancer.  ABG shows PCO2 73.3 and a bicarbonate of 40.4. Arterial pH is 7.357. Patient appears to be a chronic retainer and is compensating. Will place the patient on BiPAP as her PCO2 is 73.3.  At reevaluation after BiPAP was started patient reports this has been helping her. Repeat ABG shows improvement.  CT PE study showed right upper lobe pneumonia and small right pleural effusion. No PE.  I discussed the use results of the patient. Plan for admission. Patient is in agreement with admission.  I consulted with Dr. Lorin Mercy except for the patient for admission. She requested admission orders for stepdown unit.  This patient was discussed with and evaluated by Dr. Wyvonnia Dusky who agrees with assessment and plan.   Final Clinical Impressions(s) / ED Diagnoses   Final diagnoses:  HCAP (healthcare-associated pneumonia)  Hypoxia   CRITICAL CARE Performed by: Hanley Hays   Total critical care time: 40 minutes  Critical care time was exclusive of separately billable procedures and treating other patients.  Critical care was necessary to treat or prevent imminent or life-threatening deterioration.  Critical care was time spent personally by me  on the following activities: development of treatment plan with patient and/or surrogate as well as nursing, discussions with consultants, evaluation of patient's  response to treatment, examination of patient, obtaining history from patient or surrogate, ordering and performing treatments and interventions, ordering and review of laboratory studies, ordering and review of radiographic studies, pulse oximetry and re-evaluation of patient's condition.  New Prescriptions Current Discharge Medication List       Waynetta Pean, PA-C 05/21/16 [place], MD 05/21/16 [phone]

## 2016-05-20 NOTE — Progress Notes (Addendum)
Pharmacy Antibiotic Note  Jasmine [name] is a 70 y.o. female with hx lung nodules currently being evaluated by Dr. Julien Nordmann, presented to the ED on 05/20/2016 from nursing facility with c/o SOB and cough. CXR with suspicion for PNA. To start abx for PNA.     Plan: - vancomycin 1gm IV x1 -will f/u with labs and enter maintenance vancomycin regimen - cefepime 1gm IV x1 per MD  Adden (8/1 at 2210): scr 0.95 (crcl~43) - vancomycin 1000 mg Iv q24h  ______________________  Height: 5' (152.4 cm) Weight: 110 lb 14.4 oz (50.3 kg) IBW/kg (Calculated) : 45.5  Temp (24hrs), Avg:97.9 F (36.6 C), Min:97.7 F (36.5 C), Max:98 F (36.7 C)  No results for input(s): WBC, CREATININE, LATICACIDVEN, VANCOTROUGH, VANCOPEAK, VANCORANDOM, GENTTROUGH, GENTPEAK, GENTRANDOM, TOBRATROUGH, TOBRAPEAK, TOBRARND, AMIKACINPEAK, AMIKACINTROU, AMIKACIN in the last 168 hours.  Estimated Creatinine Clearance: 46.3 mL/min (by C-G formula based on SCr of 0.8 mg/dL).    Allergies  Allergen Reactions  . Alendronate Sodium Other (See Comments)    Pt does not remember this reaction    Thank you for allowing pharmacy to be a part of this patient's care.  Lynelle Doctor 05/20/2016 9:28 PM

## 2016-05-21 ENCOUNTER — Encounter (HOSPITAL_COMMUNITY): Payer: Self-pay | Admitting: Internal Medicine

## 2016-05-21 ENCOUNTER — Inpatient Hospital Stay (HOSPITAL_COMMUNITY): Payer: Commercial Managed Care - HMO

## 2016-05-21 DIAGNOSIS — E871 Hypo-osmolality and hyponatremia: Secondary | ICD-10-CM | POA: Diagnosis present

## 2016-05-21 DIAGNOSIS — J9622 Acute and chronic respiratory failure with hypercapnia: Secondary | ICD-10-CM | POA: Diagnosis present

## 2016-05-21 DIAGNOSIS — R7303 Prediabetes: Secondary | ICD-10-CM | POA: Diagnosis present

## 2016-05-21 DIAGNOSIS — Z801 Family history of malignant neoplasm of trachea, bronchus and lung: Secondary | ICD-10-CM | POA: Diagnosis not present

## 2016-05-21 DIAGNOSIS — J440 Chronic obstructive pulmonary disease with acute lower respiratory infection: Secondary | ICD-10-CM | POA: Diagnosis present

## 2016-05-21 DIAGNOSIS — J189 Pneumonia, unspecified organism: Secondary | ICD-10-CM | POA: Diagnosis present

## 2016-05-21 DIAGNOSIS — R739 Hyperglycemia, unspecified: Secondary | ICD-10-CM | POA: Diagnosis present

## 2016-05-21 DIAGNOSIS — Z888 Allergy status to other drugs, medicaments and biological substances status: Secondary | ICD-10-CM | POA: Diagnosis not present

## 2016-05-21 DIAGNOSIS — Y95 Nosocomial condition: Secondary | ICD-10-CM | POA: Diagnosis present

## 2016-05-21 DIAGNOSIS — Z7982 Long term (current) use of aspirin: Secondary | ICD-10-CM | POA: Diagnosis not present

## 2016-05-21 DIAGNOSIS — I5032 Chronic diastolic (congestive) heart failure: Secondary | ICD-10-CM | POA: Diagnosis present

## 2016-05-21 DIAGNOSIS — Z833 Family history of diabetes mellitus: Secondary | ICD-10-CM | POA: Diagnosis not present

## 2016-05-21 DIAGNOSIS — Z86718 Personal history of other venous thrombosis and embolism: Secondary | ICD-10-CM | POA: Diagnosis not present

## 2016-05-21 DIAGNOSIS — Z85118 Personal history of other malignant neoplasm of bronchus and lung: Secondary | ICD-10-CM | POA: Diagnosis not present

## 2016-05-21 DIAGNOSIS — Z9981 Dependence on supplemental oxygen: Secondary | ICD-10-CM | POA: Diagnosis not present

## 2016-05-21 DIAGNOSIS — F17200 Nicotine dependence, unspecified, uncomplicated: Secondary | ICD-10-CM | POA: Diagnosis present

## 2016-05-21 DIAGNOSIS — J438 Other emphysema: Secondary | ICD-10-CM | POA: Diagnosis not present

## 2016-05-21 DIAGNOSIS — R0602 Shortness of breath: Secondary | ICD-10-CM | POA: Diagnosis present

## 2016-05-21 DIAGNOSIS — Z8249 Family history of ischemic heart disease and other diseases of the circulatory system: Secondary | ICD-10-CM | POA: Diagnosis not present

## 2016-05-21 DIAGNOSIS — Z825 Family history of asthma and other chronic lower respiratory diseases: Secondary | ICD-10-CM | POA: Diagnosis not present

## 2016-05-21 DIAGNOSIS — M810 Age-related osteoporosis without current pathological fracture: Secondary | ICD-10-CM | POA: Diagnosis present

## 2016-05-21 DIAGNOSIS — I110 Hypertensive heart disease with heart failure: Secondary | ICD-10-CM | POA: Diagnosis present

## 2016-05-21 DIAGNOSIS — F329 Major depressive disorder, single episode, unspecified: Secondary | ICD-10-CM | POA: Diagnosis present

## 2016-05-21 DIAGNOSIS — J9621 Acute and chronic respiratory failure with hypoxia: Secondary | ICD-10-CM | POA: Diagnosis present

## 2016-05-21 DIAGNOSIS — E890 Postprocedural hypothyroidism: Secondary | ICD-10-CM | POA: Diagnosis present

## 2016-05-21 DIAGNOSIS — F419 Anxiety disorder, unspecified: Secondary | ICD-10-CM | POA: Diagnosis present

## 2016-05-21 DIAGNOSIS — Z823 Family history of stroke: Secondary | ICD-10-CM | POA: Diagnosis not present

## 2016-05-21 LAB — CBC WITH DIFFERENTIAL/PLATELET
Basophils Absolute: 0 10*3/uL (ref 0.0–0.1)
Basophils Relative: 0 %
EOS ABS: 0.3 10*3/uL (ref 0.0–0.7)
Eosinophils Relative: 4 %
HEMATOCRIT: 40.8 % (ref 36.0–46.0)
HEMOGLOBIN: 12.6 g/dL (ref 12.0–15.0)
LYMPHS ABS: 1.3 10*3/uL (ref 0.7–4.0)
LYMPHS PCT: 15 %
MCH: 28.4 pg (ref 26.0–34.0)
MCHC: 30.9 g/dL (ref 30.0–36.0)
MCV: 91.9 fL (ref 78.0–100.0)
MONOS PCT: 13 %
Monocytes Absolute: 1.1 10*3/uL — ABNORMAL HIGH (ref 0.1–1.0)
NEUTROS PCT: 68 %
Neutro Abs: 5.7 10*3/uL (ref 1.7–7.7)
PLATELETS: 245 10*3/uL (ref 150–400)
RBC: 4.44 MIL/uL (ref 3.87–5.11)
RDW: 17.3 % — ABNORMAL HIGH (ref 11.5–15.5)
WBC: 8.5 10*3/uL (ref 4.0–10.5)

## 2016-05-21 LAB — EXPECTORATED SPUTUM ASSESSMENT W REFEX TO RESP CULTURE

## 2016-05-21 LAB — RAPID URINE DRUG SCREEN, HOSP PERFORMED
AMPHETAMINES: NOT DETECTED
BENZODIAZEPINES: POSITIVE — AB
Barbiturates: NOT DETECTED
COCAINE: NOT DETECTED
OPIATES: POSITIVE — AB
Tetrahydrocannabinol: NOT DETECTED

## 2016-05-21 LAB — BASIC METABOLIC PANEL
Anion gap: 6 (ref 5–15)
BUN: 13 mg/dL (ref 6–20)
CHLORIDE: 89 mmol/L — AB (ref 101–111)
CO2: 39 mmol/L — AB (ref 22–32)
CREATININE: 0.65 mg/dL (ref 0.44–1.00)
Calcium: 8.8 mg/dL — ABNORMAL LOW (ref 8.9–10.3)
GFR calc non Af Amer: >60 mL/min (ref >60)
Glucose, Bld: 119 mg/dL — ABNORMAL HIGH (ref 65–99)
Potassium: 3.8 mmol/L (ref 3.5–5.1)
Sodium: 134 mmol/L — ABNORMAL LOW (ref 135–145)

## 2016-05-21 LAB — MRSA PCR SCREENING: MRSA BY PCR: NEGATIVE

## 2016-05-21 LAB — TROPONIN I
Troponin I: <0.03 ng/mL (ref <0.03)
Troponin I: <0.03 ng/mL (ref <0.03)
Troponin I: <0.03 ng/mL (ref <0.03)

## 2016-05-21 LAB — TSH: TSH: 0.659 u[IU]/mL (ref 0.350–4.500)

## 2016-05-21 LAB — STREP PNEUMONIAE URINARY ANTIGEN: Strep Pneumo Urinary Antigen: NEGATIVE

## 2016-05-21 LAB — EXPECTORATED SPUTUM ASSESSMENT W GRAM STAIN, RFLX TO RESP C

## 2016-05-21 LAB — T4, FREE: Free T4: 1.08 ng/dL (ref 0.61–1.12)

## 2016-05-21 MED ORDER — FLUTICASONE FUROATE-VILANTEROL 200-25 MCG/INH IN AEPB
1.0000 | INHALATION_SPRAY | Freq: Every day | RESPIRATORY_TRACT | Status: DC
Start: 2016-05-21 — End: 2016-05-23
  Administered 2016-05-21 – 2016-05-23 (×3): 1 via RESPIRATORY_TRACT
  Filled 2016-05-21: qty 28

## 2016-05-21 MED ORDER — ENOXAPARIN SODIUM 40 MG/0.4ML ~~LOC~~ SOLN
40.0000 mg | SUBCUTANEOUS | Status: DC
  Administered 2016-05-21 – 2016-05-23 (×3): 40 mg via SUBCUTANEOUS
  Filled 2016-05-21 (×3): qty 0.4

## 2016-05-21 MED ORDER — CEFEPIME HCL 1 G IJ SOLR
1.0000 g | Freq: Two times a day (BID) | INTRAMUSCULAR | Status: DC
  Administered 2016-05-21 (×2): 1 g via INTRAVENOUS
  Filled 2016-05-21 (×3): qty 1

## 2016-05-21 MED ORDER — ENSURE ENLIVE PO LIQD
237.0000 mL | Freq: Three times a day (TID) | ORAL | Status: DC
  Administered 2016-05-21 – 2016-05-22 (×4): 237 mL via ORAL

## 2016-05-21 MED ORDER — LEVOTHYROXINE SODIUM 75 MCG PO TABS
75.0000 ug | ORAL_TABLET | Freq: Every day | ORAL | Status: DC
  Administered 2016-05-21 – 2016-05-23 (×3): 75 ug via ORAL
  Filled 2016-05-21 (×3): qty 1

## 2016-05-21 MED ORDER — FUROSEMIDE 40 MG PO TABS
40.0000 mg | ORAL_TABLET | Freq: Two times a day (BID) | ORAL | Status: DC
  Filled 2016-05-21: qty 1

## 2016-05-21 MED ORDER — CHLORHEXIDINE GLUCONATE 0.12 % MT SOLN
15.0000 mL | Freq: Two times a day (BID) | OROMUCOSAL | Status: DC
  Administered 2016-05-21 – 2016-05-23 (×6): 15 mL via OROMUCOSAL
  Filled 2016-05-21 (×5): qty 15

## 2016-05-21 MED ORDER — ACETAMINOPHEN 325 MG PO TABS
650.0000 mg | ORAL_TABLET | Freq: Four times a day (QID) | ORAL | Status: DC | PRN
  Administered 2016-05-21: 650 mg via ORAL
  Filled 2016-05-21: qty 2

## 2016-05-21 MED ORDER — MORPHINE SULFATE (PF) 2 MG/ML IV SOLN: 1.0000 mg | Freq: Once | INTRAVENOUS | Status: DC | PRN

## 2016-05-21 MED ORDER — ASPIRIN EC 81 MG PO TBEC
81.0000 mg | DELAYED_RELEASE_TABLET | Freq: Every day | ORAL | Status: DC
Start: 2016-05-21 — End: 2016-05-23
  Administered 2016-05-21 – 2016-05-23 (×3): 81 mg via ORAL
  Filled 2016-05-21 (×3): qty 1

## 2016-05-21 MED ORDER — IPRATROPIUM-ALBUTEROL 0.5-2.5 (3) MG/3ML IN SOLN
3.0000 mL | RESPIRATORY_TRACT | Status: DC
  Administered 2016-05-21 (×2): 3 mL via RESPIRATORY_TRACT
  Filled 2016-05-21 (×2): qty 3

## 2016-05-21 MED ORDER — POTASSIUM CHLORIDE CRYS ER 10 MEQ PO TBCR
10.0000 meq | EXTENDED_RELEASE_TABLET | Freq: Two times a day (BID) | ORAL | Status: DC
  Administered 2016-05-21 – 2016-05-23 (×5): 10 meq via ORAL
  Filled 2016-05-21 (×5): qty 1

## 2016-05-21 MED ORDER — CETYLPYRIDINIUM CHLORIDE 0.05 % MT LIQD
7.0000 mL | Freq: Two times a day (BID) | OROMUCOSAL | Status: DC
  Administered 2016-05-21 – 2016-05-22 (×4): 7 mL via OROMUCOSAL

## 2016-05-21 MED ORDER — ALBUTEROL SULFATE (2.5 MG/3ML) 0.083% IN NEBU
2.5000 mg | INHALATION_SOLUTION | RESPIRATORY_TRACT | Status: DC | PRN
  Filled 2016-05-21: qty 3

## 2016-05-21 MED ORDER — IPRATROPIUM-ALBUTEROL 0.5-2.5 (3) MG/3ML IN SOLN
3.0000 mL | Freq: Four times a day (QID) | RESPIRATORY_TRACT | Status: DC
  Administered 2016-05-21 – 2016-05-23 (×8): 3 mL via RESPIRATORY_TRACT
  Filled 2016-05-21 (×7): qty 3

## 2016-05-21 MED ORDER — CITALOPRAM HYDROBROMIDE 20 MG PO TABS
20.0000 mg | ORAL_TABLET | Freq: Every day | ORAL | Status: DC
  Administered 2016-05-21 – 2016-05-23 (×3): 20 mg via ORAL
  Filled 2016-05-21 (×3): qty 1

## 2016-05-21 MED ORDER — FUROSEMIDE 40 MG PO TABS
40.0000 mg | ORAL_TABLET | Freq: Two times a day (BID) | ORAL | Status: DC
  Administered 2016-05-22 – 2016-05-23 (×3): 40 mg via ORAL
  Filled 2016-05-21 (×3): qty 1

## 2016-05-21 MED ORDER — ALPRAZOLAM 0.25 MG PO TABS
0.2500 mg | ORAL_TABLET | Freq: Two times a day (BID) | ORAL | Status: DC | PRN
  Administered 2016-05-21 (×2): 0.25 mg via ORAL
  Filled 2016-05-21 (×2): qty 1

## 2016-05-21 NOTE — Progress Notes (Signed)
Pt stable and comfortable on 4 Lpm nasal cannula.  No respiratory distress noted.  RT will continue to monitor as needed.

## 2016-05-21 NOTE — Progress Notes (Signed)
05/21/2016 7:57 AM  Progress Note  Pt was seen and examined.  H&P and orders reviewed.  Please continue current care plan.    Murvin Natal, MD

## 2016-05-21 NOTE — H&P (Signed)
History and Physical    Jasmine BUCHANNAN [id number] DOB: 1946-03-30 DOA: 05/20/2016  PCP: [name], MD Consultants:  Lelon Frohlich; [place] - ?name; [place] - dentist Patient coming from: [facility]  Chief Complaint: SOB  HPI: Jasmine [name] is a 70 y.o. female with medical history significant of COPD and lung cancer, on 4L [place] O2 at home.  Patient reports having right-sided chest pain x 2-3 days.  Slight SOB which started 3-4 days ago.  Slight cough.  Thought her sinuses were draining and making her cough a lot.  EMS found her to be 68% on 4L O2, which improved to 100% with NRB.   ED Course:  On NRB.  RUL PNA - started on HCAP therapy (lives in assisted living, hospitalized in 6/17).  PCO2 73.3 on ABG so placed on Bipap with improvement in ABG.    Review of Systems: As per HPI; otherwise 10 point review of systems reviewed and negative.   Ambulatory Status:  Ambulates without assistance at facility; uses a walker when walking distances  Past Medical History:  Diagnosis Date  . ALLERGIC RHINITIS 05/01/2007  . Anxiety   . Arthritis    "hands" (12/28/2015)  . ASTHMA 05/01/2007   "since I was a child"  . Cancer (HCC)    LUNG  . CHF (congestive heart failure) ([place])   . Chronic bronchitis ([place])   . COPD (chronic obstructive pulmonary disease) ([place])   . DEPRESSION 07/21/2008  . DVT (deep venous thrombosis) (HCC) 1960s   LLE  . FATIGUE 10/10/2010  . HYPERTENSION 05/01/2007  . HYPERTHYROIDISM 11/23/2007   Pt endorses having had Graves disease, possibly radioactive iodine x1, but no thyroidectomy  . HYPOTHYROIDISM 08/23/2009  . LUMBAR RADICULOPATHY, RIGHT 08/25/2008  . On home oxygen therapy    "3-4L; qd; all the time" (12/28/2015)  . OSTEOPOROSIS 05/01/2007  . SHOULDER PAIN, LEFT 08/23/2009  . SINUSITIS, CHRONIC 10/10/2010    Past Surgical History:  Procedure Laterality Date  . APPENDECTOMY    . DILATION AND CURETTAGE OF UTERUS  multiple   history of multiple  dialations and curettages and miscarriages, unfortunately never carrying a child to term  . ECTOPIC PREGNANCY SURGERY  "late '60s or early '70s  . ELECTROCARDIOGRAM  06/20/2006  . OOPHORECTOMY Right     Social History   Social History  . Marital status: Married    Spouse name: N/A  . Number of children: N/A  . Years of education: N/A   Occupational History  . RETIRED - Web designer [employer]   Social History Main Topics  . Smoking status: Former Smoker    Packs/day: 1.00    Years: 54.00    Types: Cigarettes    Quit date: 07/21/2015  . Smokeless tobacco: Never Used  . Alcohol use Yes     Comment: 12/28/2015 "might drink a beer/month"  . Drug use: No     Comment: 12/28/2015 "sometimes weekends and holidays"  . Sexual activity: No   Other Topics Concern  . Not on file   Social History Narrative   Lives in [place], [place]. Lives with husband, no children, but husband has 3 kids. Ambulatory, without cane or walker. Smokes cigarettes, currently 1 ppd, smoked for >20-30 yrs. Drinks beer, ~2/day, occasionally liquor, but ? suspect minimizing. Occasional MJ and endorses crack cocaine occasionally    Allergies  Allergen Reactions  . Alendronate Sodium Other (See Comments)    Pt does not remember this reaction  Family History  Problem Relation Age of Onset  . Lung cancer Father   . Alcohol abuse Brother   . Diabetes Brother   . Hypertension Other   . Stroke Mother   . Thyroid disease Mother   . Asthma      maternal aunts    Prior to Admission medications   Medication Sig Start Date End Date Taking? Authorizing Provider  albuterol (PROVENTIL HFA;VENTOLIN HFA) 108 (90 Base) MCG/ACT inhaler Inhale 2 puffs into the lungs every 6 (six) hours as needed for wheezing or shortness of breath. 02/20/16  Yes Rigoberto Noel, MD  albuterol (PROVENTIL) (2.5 MG/3ML) 0.083% nebulizer solution Take 3 mLs (2.5 mg total) by nebulization every 4 (four) hours as needed for  wheezing. 06/25/14  Yes Theodis Blaze, MD  ALPRAZolam Duanne Moron) 0.25 MG tablet Take 1 tablet (0.25 mg total) by mouth 2 (two) times daily as needed for anxiety. 04/24/16  Yes Biagio Borg, MD  aspirin EC 81 MG EC tablet Take 1 tablet (81 mg total) by mouth daily. 03/27/16  Yes Velvet Bathe, MD  citalopram (CELEXA) 20 MG tablet Take 1 tablet (20 mg total) by mouth daily. 05/14/16  Yes Biagio Borg, MD  feeding supplement, ENSURE ENLIVE, (ENSURE ENLIVE) LIQD Take 237 mLs by mouth 3 (three) times daily between meals. 12/31/15  Yes Florencia Reasons, MD  fluticasone furoate-vilanterol (BREO ELLIPTA) 200-25 MCG/INH AEPB Inhale 1 puff into the lungs daily. 12/21/15  Yes Rigoberto Noel, MD  furosemide (LASIX) 40 MG tablet Take 1 tablet (40 mg total) by mouth 2 (two) times daily. 04/24/16  Yes Biagio Borg, MD  HYDROcodone-homatropine [facility]) 5-1.5 MG/5ML syrup Take 5 mLs by mouth every 6 (six) hours as needed for cough. 05/14/16 05/24/16 Yes Biagio Borg, MD  levothyroxine (SYNTHROID, LEVOTHROID) 75 MCG tablet Take 1 tablet (75 mcg total) by mouth daily before breakfast. 12/31/15  Yes Florencia Reasons, MD  potassium chloride (K-DUR,KLOR-CON) 10 MEQ tablet Take 10 mEq by mouth 2 (two) times daily.   Yes Historical Provider, MD  tiotropium (SPIRIVA HANDIHALER) 18 MCG inhalation capsule Place 1 capsule (18 mcg total) into inhaler and inhale daily. 12/27/15  Yes Ripudeep Krystal Eaton, MD  levofloxacin (LEVAQUIN) 250 MG tablet Take 1 tablet (250 mg total) by mouth daily. Patient not taking: Reported on 05/20/2016 05/14/16   Biagio Borg, MD  OXYGEN Inhale 4 L into the lungs continuous.     Historical Provider, MD    Physical Exam: Vitals:   05/21/16 0000 05/21/16 0030 05/21/16 0039 05/21/16 0100  BP: 110/66 110/72 110/66 113/69  Pulse: 77 73 76 70  Resp: 19 19 18 15   Temp:      TempSrc:      SpO2: 100% 100% 100% 100%  Weight:      Height:         General: Appears calm and comfortable and is NAD, on BIPAP Eyes:  PERRL, EOMI, normal lids,  iris ENT:  grossly normal hearing, lips & tongue, mmm Neck:  no LAD, masses or thyromegaly Cardiovascular:  RRR, no m/r/g. No LE edema.  Respiratory:  CTA bilaterally, no w/r/r. Normal respiratory effort on BIPAP. Abdomen:  soft, ntnd, NABS Skin:  no rash or induration seen on limited exam Musculoskeletal:  grossly normal tone BUE/BLE, good ROM, no bony abnormality Psychiatric:  grossly normal mood and affect, speech fluent and appropriate, AOx3.  Somewhat poor historian - uncertain if this is her norm or due to BIPAP Neurologic:  CN 2-12 grossly intact, moves all extremities in coordinated fashion, sensation intact  Labs on Admission: I have personally reviewed following labs and imaging studies  CBC:  Recent Labs Lab 05/20/16 2124  WBC 10.1  NEUTROABS 7.5  HGB 13.4  HCT 42.4  MCV 91.8  PLT 123456   Basic Metabolic Panel:  Recent Labs Lab 05/20/16 2124  NA 134*  K 4.3  CL 86*  CO2 41*  GLUCOSE 121*  BUN 17  CREATININE 0.95  CALCIUM 9.2   GFR: Estimated Creatinine Clearance: 39 mL/min (by C-G formula based on SCr of 0.95 mg/dL). Liver Function Tests: No results for input(s): AST, ALT, ALKPHOS, BILITOT, PROT, ALBUMIN in the last 168 hours. No results for input(s): LIPASE, AMYLASE in the last 168 hours. No results for input(s): AMMONIA in the last 168 hours. Coagulation Profile: No results for input(s): INR, PROTIME in the last 168 hours. Cardiac Enzymes: No results for input(s): CKTOTAL, CKMB, CKMBINDEX, TROPONINI in the last 168 hours. BNP (last 3 results) No results for input(s): PROBNP in the last 8760 hours. HbA1C: No results for input(s): HGBA1C in the last 72 hours. CBG: No results for input(s): GLUCAP in the last 168 hours. Lipid Profile: No results for input(s): CHOL, HDL, LDLCALC, TRIG, CHOLHDL, LDLDIRECT in the last 72 hours. Thyroid Function Tests: No results for input(s): TSH, T4TOTAL, FREET4, T3FREE, THYROIDAB in the last 72 hours. Anemia Panel: No  results for input(s): VITAMINB12, FOLATE, FERRITIN, TIBC, IRON, RETICCTPCT in the last 72 hours. Urine analysis:    Component Value Date/Time   COLORURINE YELLOW 12/28/2015 0049   APPEARANCEUR CLOUDY (A) 12/28/2015 0049   LABSPEC 1.025 12/28/2015 0049   PHURINE 7.5 12/28/2015 0049   GLUCOSEU 100 (A) 12/28/2015 0049   GLUCOSEU NEGATIVE 01/11/2015 1723   HGBUR NEGATIVE 12/28/2015 0049   BILIRUBINUR NEGATIVE 12/28/2015 0049   BILIRUBINUR negative 08/11/2011 1358   KETONESUR NEGATIVE 12/28/2015 0049   PROTEINUR 100 (A) 12/28/2015 0049   UROBILINOGEN 0.2 01/11/2015 1723   NITRITE NEGATIVE 12/28/2015 0049   LEUKOCYTESUR SMALL (A) 12/28/2015 0049    Creatinine Clearance: Estimated Creatinine Clearance: 39 mL/min (by C-G formula based on SCr of 0.95 mg/dL).  Sepsis Labs: @LABRCNTIP (procalcitonin:4,lacticidven:4) ) Recent Results (from the past 240 hour(s))  Culture, blood (routine x 2)     Status: None (Preliminary result)   Collection Time: 05/20/16 10:09 PM  Result Value Ref Range Status   Specimen Description BLOOD LEFT WRIST  Final   Special Requests BOTTLES DRAWN AEROBIC AND ANAEROBIC 5CC  Final   Culture PENDING  Incomplete   Report Status PENDING  Incomplete  Culture, blood (routine x 2)     Status: None (Preliminary result)   Collection Time: 05/20/16 10:09 PM  Result Value Ref Range Status   Specimen Description BLOOD LEFT ANTECUBITAL  Final   Special Requests IN PEDIATRIC BOTTLE 5CC  Final   Culture PENDING  Incomplete   Report Status PENDING  Incomplete     Radiological Exams on Admission: Ct Angio Chest Pe W/cm &/or Wo Cm  Result Date: 05/20/2016 CLINICAL DATA:  Increasing shortness of breath. Possible lung cancer. History of asthma, tobacco abuse. EXAM: CT ANGIOGRAPHY CHEST WITH CONTRAST TECHNIQUE: Multidetector CT imaging of the chest was performed using the standard protocol during bolus administration of intravenous contrast. Multiplanar CT image reconstructions  and MIPs were obtained to evaluate the vascular anatomy. CONTRAST:  100 cc Isovue 370 COMPARISON:  PET- CT May 07, 2016 and CT chest March 24, 2016 FINDINGS: PULMONARY  ARTERY: Adequate contrast opacification of the pulmonary artery's. Main pulmonary artery is not enlarged. No pulmonary arterial filling defects to the level of the subsegmental branches. MEDIASTINUM: Heart and pericardium are unremarkable, no right heart strain. Mild coronary artery calcifications. Thoracic aorta is normal course and caliber, unremarkable. Stable mild RIGHT hilar lymphadenopathy, not hypermetabolic on prior PET-CT. LUNGS: Debris within bilateral lower lobe bronchi. No pneumothorax. Hypo enhancing dense consolidation RIGHT upper lobe, posterior segment. Small RIGHT pleural effusion. Resolution of LEFT pleural effusion. Bilateral lower lobe enhancing atelectasis. Stable 10 mm RIGHT upper lobe spiculated nodule. Smaller less consolidated appearing LEFT upper lobe 10 mm nodule. Mild centrilobular emphysema and increased lung volumes. Diffuse coarsened pulmonary interstitium. SOFT TISSUES AND OSSEOUS STRUCTURES: Included view of the abdomen is nonacute. Please note, adrenal glands not imaged. Visualized soft tissues and included osseous structures appear nonsuspicious. Punctate RIGHT breast calcification. Review of the MIP images confirms the above findings. IMPRESSION: RIGHT upper lobe pneumonia.  Small RIGHT pleural effusion. Stable 9 mm spiculated RIGHT upper lobe pulmonary nodule. Decreased size and cons acuity of the LEFT upper lobe 10 mm pulmonary nodule. Debris within lower lobe bronchi concerning for aspiration. Electronically Signed   By: Elon Alas M.D.   On: 05/20/2016 23:48   Dg Chest Port 1 View  Result Date: 05/20/2016 CLINICAL DATA:  Shortness of breath, onset today. EXAM: PORTABLE CHEST 1 VIEW COMPARISON:  PET-CT 05/07/2016 reviewed. Most recent radiographs 03/26/2016 FINDINGS: No opacity in the right midlung zone  likely localizing to the anterior right upper lobe from recent prior PET-CT. The small spiculated nodules in both lungs are not well seen radiographically. Background emphysema. Minimal blunting of right costophrenic angle, may be small fusion, incompletely occluded in the field of view. Unchanged mild cardiomegaly and unchanged mediastinal contours from prior exam. No pneumothorax. No evidence pulmonary edema. IMPRESSION: 1. New right mid lung zone opacity likely in the anterior upper lobe, suspicious for pneumonia. 2. The small bilateral pulmonary nodules on recent PET-CT are not well seen radiographically. 3. Stable cardiomegaly.  Emphysema. Electronically Signed   By: Jeb Levering M.D.   On: 05/20/2016 21:20    EKG: Independently reviewed.  NSR with rate 82; no evidence of acute ischemia  Assessment/Plan Principal Problem:   Acute on chronic respiratory failure with hypercapnia (HCC) Active Problems:   Hyponatremia   COPD (chronic obstructive pulmonary disease) (HCC)   Hypothyroidism following radioiodine therapy   Chronic diastolic congestive heart failure (HCC)   Lung mass   HCAP (healthcare-associated pneumonia)   Hyperglycemia   Acute on chronic respiratory failure with hypercapnia 2* to HCAP and COPD -Patient with SOB on admission, hypoxic when EMS arrived and placed on NRB.  NRB may have led to hypercarbia or this may be resultant from chronic underlying lung disease with acute PNA.   -Regardless, patient placed on BIPAP with good result. -Will continue BIPAP overnight but suspect that she will be able to be successfully taken off in AM -Normal lactate, normal BP, did not meet criteria for sepsis -HCAP based on assisted living as well as hospitalization within the last 90 days -On Cefepime and Vanc as per HCAP guidelines -Consider adding anaerobic coverage for aspiration based on RUL location -UDS ordered, as this would increase her risk for aspiration and she does have a h/o  crack cocaine use as recently as 3/17 -Duonebs/Albuterol ordered as well as home Breo Ellipta -Will admit to SDU for ongoing BIPAP and HCAP therapy and close monitoring  Hypothyroidism -s/p RAI -  Last TSH was abnormal at 6.076 in 3/17 -Will repeat TSH and free T4  Chronic diastolic heart failure -Echo in 3/17 with preserved EF but grade 1 diastolic dysfunction -Does not appear to be in CHF at this time -Will follow  Hyperglycemia -A1c was 6.3 in March -Will recheck -May need medication  Lung mass -Per 05/08/16 note by Dr. Julien Nordmann, suspicion for low-grade adenoCA vs. Inflammatory process -Plan is for repeat CT in 3 months and/or possible bronchoscopy in the future  DVT prophylaxis: Lovenox Code Status:  Full - confirmed with patient Family Communication: None at bedside   Disposition Plan:  Back to AL once clinically improved Consults called: None   Admission status: Admit to SDU    Karmen Bongo MD [facility]  If 7PM-7AM, please contact night-coverage [email or web address] Password [email or web address]  05/21/2016, 2:59 AM

## 2016-05-21 NOTE — ED Notes (Signed)
Hospitalist at bedside to evaluate pt.

## 2016-05-21 NOTE — Evaluation (Signed)
Clinical/Bedside Swallow Evaluation Patient Details  Name: Jasmine [name] MRN: [id number] Date of Birth: [birth year]  Today's Date: 05/21/2016 Time: SLP Start Time (ACUTE ONLY): 1325 SLP Stop Time (ACUTE ONLY): 1345 SLP Time Calculation (min) (ACUTE ONLY): 20 min  Past Medical History:  Past Medical History:  Diagnosis Date  . ALLERGIC RHINITIS 05/01/2007  . Anxiety   . Arthritis    "hands" (12/28/2015)  . ASTHMA 05/01/2007   "since I was a child"  . Cancer (HCC)    LUNG  . CHF (congestive heart failure) ([place])   . Chronic bronchitis ([place])   . COPD (chronic obstructive pulmonary disease) ([place])   . DEPRESSION 07/21/2008  . DVT (deep venous thrombosis) (HCC) 1960s   LLE  . FATIGUE 10/10/2010  . HYPERTENSION 05/01/2007  . HYPERTHYROIDISM 11/23/2007   Pt endorses having had Graves disease, possibly radioactive iodine x1, but no thyroidectomy  . HYPOTHYROIDISM 08/23/2009  . LUMBAR RADICULOPATHY, RIGHT 08/25/2008  . On home oxygen therapy    "3-4L; qd; all the time" (12/28/2015)  . OSTEOPOROSIS 05/01/2007  . SHOULDER PAIN, LEFT 08/23/2009  . SINUSITIS, CHRONIC 10/10/2010   Past Surgical History:  Past Surgical History:  Procedure Laterality Date  . APPENDECTOMY    . DILATION AND CURETTAGE OF UTERUS  multiple   history of multiple dialations and curettages and miscarriages, unfortunately never carrying a child to term  . ECTOPIC PREGNANCY SURGERY  "late '60s or early '70s  . ELECTROCARDIOGRAM  06/20/2006  . OOPHORECTOMY Right    HPI:  70 yo female adm to [facility] with respiratory difficulties.  PMH + for ? lung cancer, COPD, anxiety, asthma.  Pt needed facemask in ambulance during transfer and Bipap for CT.  CT showed concerning for aspiration due to debris found in lower lobe.    Swallow evaluation ordered.     Assessment / Plan / Recommendation Clinical Impression  Pt presents with functional oropharyngeal swallow - no s/s of aspiration with all po observed and pt with negative CN exam.  Pt  observed eating lunch - self feeding.  Voice is strong and pt denies dysphagia/reflux issues or any significant event prior to admission.  If she is overtly aspirating, it is SILENT.   Educated pt to general precautions, will sign off.  Thanks.     Aspiration Risk  Mild aspiration risk    Diet Recommendation Regular;Thin liquid   Liquid Administration via: Cup;Straw Medication Administration: Whole meds with liquid Supervision: Patient able to self feed Compensations: Slow rate;Small sips/bites Postural Changes: Seated upright at 90 degrees;Remain upright for at least 30 minutes after po intake    Other  Recommendations Oral Care Recommendations: Oral care BID   Follow up Recommendations       Frequency and Duration   n/a         Prognosis   n/a     Swallow Study   General Date of Onset: 05/21/16 HPI: 70 yo female adm to [facility] with respiratory difficulties.  PMH + for ? lung cancer, COPD, anxiety, asthma.  Pt needed facemask in ambulance during transfer and Bipap for CT.  CT showed concerning for aspiration due to debris found in lower lobe.    Swallow evaluation ordered.   Type of Study: Bedside Swallow Evaluation Diet Prior to this Study: Regular;Thin liquids Temperature Spikes Noted: No Respiratory Status: Nasal cannula History of Recent Intubation: No Behavior/Cognition: Alert;Cooperative;Pleasant mood;Confused Oral Cavity Assessment: Within Functional Limits Oral Care Completed by SLP: No Oral Cavity - Dentition: Adequate natural  dentition Vision: Functional for self-feeding Self-Feeding Abilities: Able to feed self Patient Positioning: Upright in bed Baseline Vocal Quality: Normal Volitional Cough: Strong Volitional Swallow: Able to elicit    Oral/Motor/Sensory Function Overall Oral Motor/Sensory Function: Within functional limits   Ice Chips Ice chips: Not tested   Thin Liquid Thin Liquid: Within functional limits Presentation: Straw    Nectar Thick Nectar Thick  Liquid: Not tested   Honey Thick Honey Thick Liquid: Not tested   Puree Puree: Not tested   Solid   GO   Solid: Within functional limits Presentation: Self Fed Other Comments: pt masticates slowly and is protective of his airway overall        Luanna Salk, [place] [facility] SLP [phone]

## 2016-05-22 ENCOUNTER — Other Ambulatory Visit: Payer: Self-pay

## 2016-05-22 DIAGNOSIS — R739 Hyperglycemia, unspecified: Secondary | ICD-10-CM

## 2016-05-22 DIAGNOSIS — R918 Other nonspecific abnormal finding of lung field: Secondary | ICD-10-CM

## 2016-05-22 DIAGNOSIS — I5032 Chronic diastolic (congestive) heart failure: Secondary | ICD-10-CM

## 2016-05-22 DIAGNOSIS — J438 Other emphysema: Secondary | ICD-10-CM

## 2016-05-22 DIAGNOSIS — E871 Hypo-osmolality and hyponatremia: Secondary | ICD-10-CM

## 2016-05-22 DIAGNOSIS — E890 Postprocedural hypothyroidism: Secondary | ICD-10-CM

## 2016-05-22 DIAGNOSIS — J189 Pneumonia, unspecified organism: Secondary | ICD-10-CM

## 2016-05-22 LAB — CBC WITH DIFFERENTIAL/PLATELET
BASOS ABS: 0 10*3/uL (ref 0.0–0.1)
BASOS PCT: 0 %
EOS ABS: 0.5 10*3/uL (ref 0.0–0.7)
EOS PCT: 6 %
HEMATOCRIT: 38.6 % (ref 36.0–46.0)
HEMOGLOBIN: 12.5 g/dL (ref 12.0–15.0)
Lymphocytes Relative: 14 %
Lymphs Abs: 1.2 10*3/uL (ref 0.7–4.0)
MCH: 29.3 pg (ref 26.0–34.0)
MCHC: 32.4 g/dL (ref 30.0–36.0)
MCV: 90.6 fL (ref 78.0–100.0)
MONO ABS: 1.3 10*3/uL — AB (ref 0.1–1.0)
Monocytes Relative: 15 %
Neutro Abs: 5.5 10*3/uL (ref 1.7–7.7)
Neutrophils Relative %: 65 %
Platelets: 245 10*3/uL (ref 150–400)
RBC: 4.26 MIL/uL (ref 3.87–5.11)
RDW: 17.3 % — AB (ref 11.5–15.5)
WBC: 8.5 10*3/uL (ref 4.0–10.5)

## 2016-05-22 LAB — COMPREHENSIVE METABOLIC PANEL
ALBUMIN: 3.1 g/dL — AB (ref 3.5–5.0)
ALT: 12 U/L — ABNORMAL LOW (ref 14–54)
AST: 20 U/L (ref 15–41)
Alkaline Phosphatase: 48 U/L (ref 38–126)
Anion gap: 7 (ref 5–15)
BILIRUBIN TOTAL: 0.9 mg/dL (ref 0.3–1.2)
BUN: 14 mg/dL (ref 6–20)
CO2: 33 mmol/L — AB (ref 22–32)
Calcium: 9.1 mg/dL (ref 8.9–10.3)
Chloride: 95 mmol/L — ABNORMAL LOW (ref 101–111)
Creatinine, Ser: 0.53 mg/dL (ref 0.44–1.00)
GFR calc Af Amer: >60 mL/min (ref >60)
GFR calc non Af Amer: >60 mL/min (ref >60)
GLUCOSE: 107 mg/dL — AB (ref 65–99)
POTASSIUM: 4.4 mmol/L (ref 3.5–5.1)
SODIUM: 135 mmol/L (ref 135–145)
TOTAL PROTEIN: 6.5 g/dL (ref 6.5–8.1)

## 2016-05-22 LAB — HEMOGLOBIN A1C
HEMOGLOBIN A1C: 6.2 % — AB (ref 4.8–5.6)
MEAN PLASMA GLUCOSE: 131 mg/dL

## 2016-05-22 LAB — HIV ANTIBODY (ROUTINE TESTING W REFLEX): HIV Screen 4th Generation wRfx: NONREACTIVE

## 2016-05-22 MED ORDER — AMOXICILLIN-POT CLAVULANATE 875-125 MG PO TABS
1.0000 | ORAL_TABLET | Freq: Two times a day (BID) | ORAL | Status: DC
  Administered 2016-05-22 – 2016-05-23 (×3): 1 via ORAL
  Filled 2016-05-22 (×3): qty 1

## 2016-05-22 MED ORDER — HYDROCODONE-ACETAMINOPHEN 5-325 MG PO TABS
1.0000 | ORAL_TABLET | Freq: Four times a day (QID) | ORAL | Status: DC | PRN
  Administered 2016-05-22: 1 via ORAL
  Filled 2016-05-22: qty 1

## 2016-05-22 NOTE — Patient Outreach (Signed)
[facility]) Care Management  05/22/2016  Jasmine [name] [birth year] [id number]   Patient noted to be hospitalized.  Will send notification to hospital liaison.  Will send physician discipline closure letter.  [name], RN, MSN [place] [phone]

## 2016-05-22 NOTE — Consult Note (Addendum)
   [facility] CM Inpatient Consult   05/22/2016  Jasmine [name] 1946-05-15 [id number]    [name] is active with [facility] Management services. Went to bedside to speak with her. Discussed that [facility] will follow up with her post hospital discharge by performing post transition of care calls and potential monthly home visits. She has been followed by [facility] prior to admission. Contact information left at bedside. She has active consent on file. [name] denies having issues with medications or with transportation. She currently resides at [facility]. She reports [name] provides transportation to MD appointments. Confirmed best contact number as [phone]. Made inpatient RNCM aware that [name] is active with [facility] Care Management.   Will request to be assigned to [place].   Marthenia Rolling, MSN-Ed, RN,BSN [facility] Liaison [phone]

## 2016-05-22 NOTE — Progress Notes (Signed)
PROGRESS NOTE    Jasmine [name]  [id number]  DOB: 1946/10/05  DOA: 05/20/2016 PCP: [name], MD Outpatient Specialists:   Hospital course: Jasmine [name] is a 70 y.o. female with medical history significant of COPD and lung cancer, on 4L [place] O2 at home.  Patient reports having right-sided chest pain x 2-3 days.  Slight SOB which started 3-4 days ago.  Slight cough.  Thought her sinuses were draining and making her cough a lot.  EMS found her to be 68% on 4L O2, which improved to 100% with NRB.  ED Course:  On NRB.  RUL PNA - started on HCAP therapy (lives in assisted living, hospitalized in 6/17).  PCO2 73.3 on ABG so placed on Bipap with improvement in ABG.    Assessment & Plan: Acute on chronic respiratory failure with hypercapnia 2* to HCAP and COPD -Patient with SOB on admission, hypoxic when EMS arrived and placed on NRB.  NRB may have led to hypercarbia or this may be resultant from chronic underlying lung disease with acute PNA.   -Regardless, patient placed on BIPAP with good result.  Now has been weaned off bipap to [place] and doing better.   -Will continue BIPAP overnight but suspect that she will be able to be successfully taken off in AM -Normal lactate, normal BP, did not meet criteria for sepsis -treated for HCAP based on assisted living as well as hospitalization within the last 90 days -Initially started on Cefepime and Vanc as per HCAP guidelines, now de-escalating antibiotics -Adding anaerobic coverage for aspiration based on RUL location -UDS positive for benzos and opioids and this would increase her risk for aspiration and she does have a h/o crack cocaine use as recently as 3/17 -Duonebs/Albuterol ordered as well as home Breo Ellipta -Transfer to med bed   Hypothyroidism -s/p RAI -Last TSH was abnormal at 6.076 in 3/17 -TSH and free T4 WNL  Chronic diastolic heart failure -Echo in 3/17 with preserved EF but grade 1 diastolic dysfunction -Does not appear to  be in CHF at this time -Will follow  Hyperglycemia / Prediabetes -A1c was 6.3 in March -Repeat 6.2  Lung mass -Per 05/08/16 note by Dr. Julien Nordmann, suspicion for low-grade adenoCA vs. Inflammatory process -Plan is for repeat CT in 3 months and/or possible bronchoscopy in the future  DVT prophylaxis: Lovenox Code Status:  Full - confirmed with patient Family Communication: None at bedside               Disposition Plan:  Back to AL once clinically improved Consults called: None                      Admission status: Admit  Antimicrobials: Anti-infectives    Start     Dose/Rate Route Frequency Ordered Stop   05/21/16 2200  vancomycin (VANCOCIN) IVPB 1000 mg/200 mL premix     1,000 mg 200 mL/hr over 60 Minutes Intravenous Every 24 hours 05/20/16 2210     05/21/16 1000  ceFEPIme (MAXIPIME) 1 g in dextrose 5 % 50 mL IVPB     1 g 100 mL/hr over 30 Minutes Intravenous 2 times daily 05/21/16 0149 05/29/16 0959   05/20/16 2200  ceFEPIme (MAXIPIME) 1 g in dextrose 5 % 50 mL IVPB     1 g 100 mL/hr over 30 Minutes Intravenous  Once 05/20/16 2123 05/20/16 2240   05/20/16 2145  vancomycin (VANCOCIN) IVPB 1000 mg/200 mL premix     1,000  mg 200 mL/hr over 60 Minutes Intravenous  Once 05/20/16 2137 05/20/16 2356      Subjective: Pt reports that she is starting to feel better.  She is off bipap now.  Appetite improving.   Objective: Vitals:   05/22/16 0500 05/22/16 0600 05/22/16 0755 05/22/16 0758  BP: (!) 92/56 (!) 95/57 99/77   Pulse: 69 65 76   Resp: 16 16 18    Temp:      TempSrc:      SpO2: 100% 100% 97% 97%  Weight:      Height:        Intake/Output Summary (Last 24 hours) at 05/22/16 0805 Last data filed at 05/22/16 0600  Gross per 24 hour  Intake             1040 ml  Output              900 ml  Net              140 ml   Filed Weights   05/20/16 2024  Weight: 50.3 kg (110 lb 14.4 oz)    Exam:   General: Appears calm and comfortable and is NAD  Eyes:  PERRL,  EOMI, normal lids, iris  ENT:  grossly normal hearing, lips & tongue, mmm  Neck:  no LAD, masses or thyromegaly  Cardiovascular:  RRR, no m/r/g. No LE edema.   Respiratory:  coarse upper airway noises.  CTA bilaterally, no w/r/r. Normal respiratory effort.  Abdomen:  soft, ntnd, NABS  Skin:  no rash or induration seen on limited exam  Musculoskeletal:  grossly normal tone BUE/BLE, good ROM, no bony abnormality  Psychiatric:  grossly normal mood and affect, speech fluent and appropriate, AOx3.  Somewhat poor historian - uncertain if this is her norm or due to BIPAP  Neurologic:  CN 2-12 grossly intact, moves all extremities in coordinated fashion, sensation intact  Data Reviewed: Basic Metabolic Panel:  Recent Labs Lab 05/20/16 2124 05/21/16 0339 05/22/16 0404  NA 134* 134* 135  K 4.3 3.8 4.4  CL 86* 89* 95*  CO2 41* 39* 33*  GLUCOSE 121* 119* 107*  BUN 17 13 14   CREATININE 0.95 0.65 0.53  CALCIUM 9.2 8.8* 9.1   Liver Function Tests:  Recent Labs Lab 05/22/16 0404  AST 20  ALT 12*  ALKPHOS 48  BILITOT 0.9  PROT 6.5  ALBUMIN 3.1*   No results for input(s): LIPASE, AMYLASE in the last 168 hours. No results for input(s): AMMONIA in the last 168 hours. CBC:  Recent Labs Lab 05/20/16 2124 05/21/16 0339 05/22/16 0404  WBC 10.1 8.5 8.5  NEUTROABS 7.5 5.7 5.5  HGB 13.4 12.6 12.5  HCT 42.4 40.8 38.6  MCV 91.8 91.9 90.6  PLT 256 245 245   Cardiac Enzymes:  Recent Labs Lab 05/21/16 0906 05/21/16 1512 05/21/16 2135  TROPONINI <0.03 <0.03 <0.03   CBG (last 3)  No results for input(s): GLUCAP in the last 72 hours. Recent Results (from the past 240 hour(s))  Culture, blood (routine x 2)     Status: None (Preliminary result)   Collection Time: 05/20/16 10:09 PM  Result Value Ref Range Status   Specimen Description BLOOD LEFT WRIST  Final   Special Requests BOTTLES DRAWN AEROBIC AND ANAEROBIC 5CC  Final   Culture   Final    NO GROWTH < 24  HOURS Performed at [facility]    Report Status PENDING  Incomplete  Culture, blood (routine x 2)  Status: None (Preliminary result)   Collection Time: 05/20/16 10:09 PM  Result Value Ref Range Status   Specimen Description BLOOD LEFT ANTECUBITAL  Final   Special Requests IN PEDIATRIC BOTTLE 5CC  Final   Culture   Final    NO GROWTH < 24 HOURS Performed at [facility]    Report Status PENDING  Incomplete  MRSA PCR Screening     Status: None   Collection Time: 05/21/16  5:54 AM  Result Value Ref Range Status   MRSA by PCR NEGATIVE NEGATIVE Final    Comment:        The GeneXpert MRSA Assay (FDA approved for NASAL specimens only), is one component of a comprehensive MRSA colonization surveillance program. It is not intended to diagnose MRSA infection nor to guide or monitor treatment for MRSA infections.   Culture, sputum-assessment     Status: None   Collection Time: 05/21/16  8:00 PM  Result Value Ref Range Status   Specimen Description SPUTUM  Final   Special Requests NONE  Final   Sputum evaluation   Final    THIS SPECIMEN IS ACCEPTABLE. RESPIRATORY CULTURE REPORT TO FOLLOW.   Report Status 05/21/2016 FINAL  Final  Culture, respiratory (NON-Expectorated)     Status: None (Preliminary result)   Collection Time: 05/21/16  8:00 PM  Result Value Ref Range Status   Specimen Description SPUTUM  Final   Special Requests NONE  Final   Gram Stain   Final    ABUNDANT WBC PRESENT,BOTH PMN AND MONONUCLEAR FEW GRAM POSITIVE COCCI IN PAIRS Performed at [facility]    Culture PENDING  Incomplete   Report Status PENDING  Incomplete     Studies: Ct Angio Chest Pe W/cm &/or Wo Cm  Result Date: 05/20/2016 CLINICAL DATA:  Increasing shortness of breath. Possible lung cancer. History of asthma, tobacco abuse. EXAM: CT ANGIOGRAPHY CHEST WITH CONTRAST TECHNIQUE: Multidetector CT imaging of the chest was performed using the standard protocol during bolus  administration of intravenous contrast. Multiplanar CT image reconstructions and MIPs were obtained to evaluate the vascular anatomy. CONTRAST:  100 cc Isovue 370 COMPARISON:  PET- CT May 07, 2016 and CT chest March 24, 2016 FINDINGS: PULMONARY ARTERY: Adequate contrast opacification of the pulmonary artery's. Main pulmonary artery is not enlarged. No pulmonary arterial filling defects to the level of the subsegmental branches. MEDIASTINUM: Heart and pericardium are unremarkable, no right heart strain. Mild coronary artery calcifications. Thoracic aorta is normal course and caliber, unremarkable. Stable mild RIGHT hilar lymphadenopathy, not hypermetabolic on prior PET-CT. LUNGS: Debris within bilateral lower lobe bronchi. No pneumothorax. Hypo enhancing dense consolidation RIGHT upper lobe, posterior segment. Small RIGHT pleural effusion. Resolution of LEFT pleural effusion. Bilateral lower lobe enhancing atelectasis. Stable 10 mm RIGHT upper lobe spiculated nodule. Smaller less consolidated appearing LEFT upper lobe 10 mm nodule. Mild centrilobular emphysema and increased lung volumes. Diffuse coarsened pulmonary interstitium. SOFT TISSUES AND OSSEOUS STRUCTURES: Included view of the abdomen is nonacute. Please note, adrenal glands not imaged. Visualized soft tissues and included osseous structures appear nonsuspicious. Punctate RIGHT breast calcification. Review of the MIP images confirms the above findings. IMPRESSION: RIGHT upper lobe pneumonia.  Small RIGHT pleural effusion. Stable 9 mm spiculated RIGHT upper lobe pulmonary nodule. Decreased size and cons acuity of the LEFT upper lobe 10 mm pulmonary nodule. Debris within lower lobe bronchi concerning for aspiration. Electronically Signed   By: Elon Alas M.D.   On: 05/20/2016 23:48   Dg Chest [facility]  1 View  Result Date: 05/21/2016 CLINICAL DATA:  Mid chest pain, and discomfort with productive cough and congestion. EXAM: PORTABLE CHEST 1 VIEW COMPARISON:   Chest CT 05/20/2016. FINDINGS: Cardiomegaly. Calcified tortuous aorta. Coarsened interstitium, likely COPD with superimposed edema. Focal infiltrate RIGHT upper lobe suggesting acute pneumonia. No effusion or pneumothorax. IMPRESSION: Cardiomegaly.  Acute RIGHT lower lobe pneumonia. Electronically Signed   By: Staci Righter M.D.   On: 05/21/2016 09:59   Dg Chest Port 1 View  Result Date: 05/20/2016 CLINICAL DATA:  Shortness of breath, onset today. EXAM: PORTABLE CHEST 1 VIEW COMPARISON:  PET-CT 05/07/2016 reviewed. Most recent radiographs 03/26/2016 FINDINGS: No opacity in the right midlung zone likely localizing to the anterior right upper lobe from recent prior PET-CT. The small spiculated nodules in both lungs are not well seen radiographically. Background emphysema. Minimal blunting of right costophrenic angle, may be small fusion, incompletely occluded in the field of view. Unchanged mild cardiomegaly and unchanged mediastinal contours from prior exam. No pneumothorax. No evidence pulmonary edema. IMPRESSION: 1. New right mid lung zone opacity likely in the anterior upper lobe, suspicious for pneumonia. 2. The small bilateral pulmonary nodules on recent PET-CT are not well seen radiographically. 3. Stable cardiomegaly.  Emphysema. Electronically Signed   By: Jeb Levering M.D.   On: 05/20/2016 21:20     Scheduled Meds: . antiseptic oral rinse  7 mL Mouth Rinse q12n4p  . aspirin EC  81 mg Oral Daily  . ceFEPime (MAXIPIME) IV  1 g Intravenous BID  . chlorhexidine  15 mL Mouth Rinse BID  . citalopram  20 mg Oral Daily  . enoxaparin (LOVENOX) injection  40 mg Subcutaneous Q24H  . feeding supplement (ENSURE ENLIVE)  237 mL Oral TID BM  . fluticasone furoate-vilanterol  1 puff Inhalation Daily  . furosemide  40 mg Oral BID  . ipratropium-albuterol  3 mL Nebulization QID  . levothyroxine  75 mcg Oral QAC breakfast  . potassium chloride  10 mEq Oral BID  . vancomycin  1,000 mg Intravenous Q24H     Continuous Infusions:   Principal Problem:   Acute on chronic respiratory failure with hypercapnia (HCC) Active Problems:   Hyponatremia   COPD (chronic obstructive pulmonary disease) (HCC)   Hypothyroidism following radioiodine therapy   Chronic diastolic congestive heart failure ([place])   Lung mass   HCAP (healthcare-associated pneumonia)   Hyperglycemia  Time spent:   Irwin Brakeman, MD, FAAFP [facility] Pager [phone]  If 7PM-7AM, please contact night-coverage [email or web address] Password [email or web address] 05/22/2016, 8:05 AM    LOS: 1 day

## 2016-05-22 NOTE — Progress Notes (Signed)
Report to Anderson Malta, RN to assume care of patient at transfer to [id number].

## 2016-05-23 LAB — LEGIONELLA PNEUMOPHILA SEROGP 1 UR AG: L. pneumophila Serogp 1 Ur Ag: NEGATIVE

## 2016-05-23 MED ORDER — ALPRAZOLAM 0.25 MG PO TABS: 0.2500 mg | ORAL_TABLET | Freq: Two times a day (BID) | ORAL | 0 refills | Status: DC | PRN

## 2016-05-23 MED ORDER — AMOXICILLIN-POT CLAVULANATE 875-125 MG PO TABS: 1.0000 | ORAL_TABLET | Freq: Two times a day (BID) | ORAL | 0 refills | Status: AC

## 2016-05-23 NOTE — Discharge Instructions (Signed)
Acute Respiratory Distress Syndrome Acute respiratory distress syndrome is a life-threatening condition in which fluid collects in the lungs. This condition can cause severe shortness of breath and low oxygen levels in the blood. It can also cause the lungs and other vital organs to fail. The condition usually develops within 24 to 48 hours of an infection, illness, surgery, or injury. CAUSES This condition may be caused by:  An infection, such as sepsis or pneumonia.  A serious injury to the head or chest.  A major surgery.  A drug overdose.  Breathing in harmful chemicals or smoke.  Blood transfusions.  A blood clot in the lungs. SYMPTOMS Symptoms of this condition include:  Fever.  Difficulty breathing.  Bluish skin (cyanosis).  A fast or irregular heartbeat.  Low blood pressure (hypotension).  Agitation.  Confusion.  Lack of energy (lethargy).  Sweating. DIAGNOSIS This condition may be diagnosed by using tests to rule out other diseases and conditions that cause similar symptoms. You may have:  A chest X-ray or CT scan.  Blood tests.  A sputum culture. In this test, you will be asked to spit so that a sample of lung fluid can be taken for testing.  Bronchoscopy. During this test a thin, flexible tool is passed into the mouth or nose, down the windpipe, and into the lungs. TREATMENT This condition may be treated with:  Oxygen. A breathing machine (ventilator) is often used to provide oxygen and help with breathing.  Medicine to help you relax (sedative).  Fluids and nutrients given through an IV tube.  Blood pressure medicine.  Steroid medicine to help decrease inflammation in the lungs.  Diuretic medicine to get rid of extra fluid in the body. Additional treatment may be needed, depending on the cause of the condition. It can take up to 12 months to recover from this condition. Some people recover fully, but others may continue to  have:  Weakness.  Shortness of breath.  Memory problems.  Depression. HOME CARE INSTRUCTIONS Until you recover from this condition:  Do not smoke.  Limit alcohol intake to no more than 1 drink per day for nonpregnant women and 2 drinks per day for men. One drink equals 12 oz of beer, 5 oz of wine, or 1 oz of hard liquor.  Ask friends and family to help you if daily activities make you tired. SEEK MEDICAL CARE IF:  You become short of breath with activity or while resting.  You develop a cough that does not go away.  You have a fever. SEEK IMMEDIATE MEDICAL CARE IF:  You have sudden shortness of breath.  You develop chest pain that does not go away.  You develop swelling or pain in one of your legs.  You cough up blood.   This information is not intended to replace advice given to you by your health care provider. Make sure you discuss any questions you have with your health care provider.   Document Released: 10/06/2005 Document Revised: 02/20/2015 Document Reviewed: 10/02/2014 Elsevier Interactive Patient Education 2016 [employer].  Respiratory failure is when your lungs are not working well and your breathing (respiratory) system fails. When respiratory failure occurs, it is difficult for your lungs to get enough oxygen, get rid of carbon dioxide, or both. Respiratory failure can be life threatening.  Respiratory failure can be acute or chronic. Acute respiratory failure is sudden, severe, and requires emergency medical treatment. Chronic respiratory failure is less severe, happens over time, and requires ongoing treatment.  WHAT ARE THE CAUSES OF ACUTE RESPIRATORY FAILURE?  Any problem affecting the heart or lungs can cause acute respiratory failure. Some of these causes include the following:  Chronic bronchitis and emphysema (COPD).   Blood clot going to a lung (pulmonary embolism).   Having water in the lungs caused by heart failure, lung injury, or  infection (pulmonary edema).   Collapsed lung (pneumothorax).   Pneumonia.   Pulmonary fibrosis.   Obesity.   Asthma.   Heart failure.   Any type of trauma to the chest that can make breathing difficult.   Nerve or muscle diseases making chest movements difficult. HOW WILL MY ACUTE RESPIRATORY FAILURE BE TREATED?  Treatment of acute respiratory failure depends on the cause of the respiratory failure. Usually, you will stay in the intensive care unit so your breathing can be watched closely. Treatment can include the following:  Oxygen. Oxygen can be delivered through the following:  Nasal cannula. This is small tubing that goes in your nose to give you oxygen.  Face mask. A face mask covers your nose and mouth to give you oxygen.  Medicine. Different medicines can be given to help with breathing. These can include:  Nebulizers. Nebulizers deliver medicines to open the air passages (bronchodilators). These medicines help to open or relax the airways in the lungs so you can breathe better. They can also help loosen mucus from your lungs.  Diuretics. Diuretic medicines can help you breathe better by getting rid of extra water in your body.  Steroids. Steroid medicines can help decrease swelling (inflammation) in your lungs.  Antibiotics.  Chest tube. If you have a collapsed lung (pneumothorax), a chest tube is placed to help reinflate the lung.  Noninvasive positive pressure ventilation (NPPV). This is a tight-fitting mask that goes over your nose and mouth. The mask has tubing that is attached to a machine. The machine blows air into the tubing, which helps to keep the tiny air sacs (alveoli) in your lungs open. This machine allows you to breathe on your own.  Ventilator. A ventilator is a breathing machine. When on a ventilator, a breathing tube is put into the lungs. A ventilator is used when you can no longer breathe well enough on your own. You may have low oxygen  levels or high carbon dioxide (CO2) levels in your blood. When you are on a ventilator, sedation and pain medicines are given to make you sleep so your lungs can heal. SEEK IMMEDIATE MEDICAL CARE IF:  You have shortness of breath (dyspnea) with or without activity.  You have rapid breathing (tachypnea).  You are wheezing.  You are unable to say more than a few words without having to catch your breath.  You find it very difficult to function normally.  You have a fast heart rate.  You have a bluish color to your finger or toe nail beds.  You have confusion or drowsiness or both.   This information is not intended to replace advice given to you by your health care provider. Make sure you discuss any questions you have with your health care provider.   Document Released: 10/11/2013 Document Revised: 06/27/2015 Document Reviewed: 10/11/2013 Elsevier Interactive Patient Education 2016 [employer]   Chronic Obstructive Pulmonary Disease Exacerbation Chronic obstructive pulmonary disease (COPD) is a common lung problem. In COPD, the flow of air from the lungs is limited. COPD exacerbations are times that breathing gets worse and you need extra treatment. Without treatment they can be life threatening.  If they happen often, your lungs can become more damaged. If your COPD gets worse, your doctor may treat you with:  Medicines.  Oxygen.  Different ways to clear your airway, such as using a mask. HOME CARE  Do not smoke.  Avoid tobacco smoke and other things that bother your lungs.  If given, take your antibiotic medicine as told. Finish the medicine even if you start to feel better.  Only take medicines as told by your doctor.  Drink enough fluids to keep your pee (urine) clear or pale yellow (unless your doctor has told you not to).  Use a cool mist machine (vaporizer).  If you use oxygen or a machine that turns liquid medicine into a mist (nebulizer), continue to use them  as told.  Keep up with shots (vaccinations) as told by your doctor.  Exercise regularly.  Eat healthy foods.  Keep all doctor visits as told. GET HELP RIGHT AWAY IF:  You are very short of breath and it gets worse.  You have trouble talking.  You have bad chest pain.  You have blood in your spit (sputum).  You have a fever.  You keep throwing up (vomiting).  You feel weak, or you pass out (faint).  You feel confused.  You keep getting worse. MAKE SURE YOU:  Understand these instructions.  Will watch your condition.  Will get help right away if you are not doing well or get worse.   This information is not intended to replace advice given to you by your health care provider. Make sure you discuss any questions you have with your health care provider.   Document Released: 09/25/2011 Document Revised: 10/27/2014 Document Reviewed: 06/10/2013 Elsevier Interactive Patient Education 2016 [place] Pneumonia, Adult Pneumonia is an infection of the lungs. One type of pneumonia can happen while a person is in a hospital. A different type can happen when a person is not in a hospital (community-acquired pneumonia). It is easy for this kind to spread from person to person. It can spread to you if you breathe near an infected person who coughs or sneezes. Some symptoms include:  A dry cough.  A wet (productive) cough.  Fever.  Sweating.  Chest pain. HOME CARE  Take over-the-counter and prescription medicines only as told by your doctor.  Only take cough medicine if you are losing sleep.  If you were prescribed an antibiotic medicine, take it as told by your doctor. Do not stop taking the antibiotic even if you start to feel better.  Sleep with your head and neck raised (elevated). You can do this by putting a few pillows under your head, or you can sleep in a recliner.  Do not use tobacco products. These include cigarettes, chewing  tobacco, and e-cigarettes. If you need help quitting, ask your doctor.  Drink enough water to keep your pee (urine) clear or pale yellow. A shot (vaccine) can help prevent pneumonia. Shots are often suggested for:  People older than 71 years of age.  People older than 71 years of age:  Who are having cancer treatment.  Who have long-term (chronic) lung disease.  Who have problems with their body's defense system (immune system). You may also prevent pneumonia if you take these actions:  Get the flu (influenza) shot every year.  Go to the dentist as often as told.  Wash your hands often. If soap and water are not available, use hand sanitizer. GET HELP IF:  You have  a fever.  You lose sleep because your cough medicine does not help. GET HELP RIGHT AWAY IF:  You are short of breath and it gets worse.  You have more chest pain.  Your sickness gets worse. This is very serious if:  You are an older adult.  Your body's defense system is weak.  You cough up blood.   This information is not intended to replace advice given to you by your health care provider. Make sure you discuss any questions you have with your health care provider.   Document Released: 03/24/2008 Document Revised: 06/27/2015 Document Reviewed: 01/31/2015 Elsevier Interactive Patient Education 2016 [employer].   How to Use an Inhaler Using your inhaler correctly is very important. Good technique will make sure that the medicine reaches your lungs.  HOW TO USE AN INHALER: 1. Take the cap off the inhaler. 2. If this is the first time using your inhaler, you need to prime it. Shake the inhaler for 5 seconds. Release four puffs into the air, away from your face. Ask your doctor for help if you have questions. 3. Shake the inhaler for 5 seconds. 4. Turn the inhaler so the bottle is above the mouthpiece. 5. Put your pointer finger on top of the bottle. Your thumb holds the bottom of the inhaler. 6. Open  your mouth. 7. Either hold the inhaler away from your mouth (the width of 2 fingers) or place your lips tightly around the mouthpiece. Ask your doctor which way to use your inhaler. 8. Breathe out as much air as possible. 9. Breathe in and push down on the bottle 1 time to release the medicine. You will feel the medicine go in your mouth and throat. 10. Continue to take a deep breath in very slowly. Try to fill your lungs. 11. After you have breathed in completely, hold your breath for 10 seconds. This will help the medicine to settle in your lungs. If you cannot hold your breath for 10 seconds, hold it for as long as you can before you breathe out. 12. Breathe out slowly, through pursed lips. Whistling is an example of pursed lips. 13. If your doctor has told you to take more than 1 puff, wait at least 15-30 seconds between puffs. This will help you get the best results from your medicine. Do not use the inhaler more than your doctor tells you to. 14. Put the cap back on the inhaler. 15. Follow the directions from your doctor or from the inhaler package about cleaning the inhaler. If you use more than one inhaler, ask your doctor which inhalers to use and what order to use them in. Ask your doctor to help you figure out when you will need to refill your inhaler.  If you use a steroid inhaler, always rinse your mouth with water after your last puff, gargle and spit out the water. Do not swallow the water. GET HELP IF:  The inhaler medicine only partially helps to stop wheezing or shortness of breath.  You are having trouble using your inhaler.  You have some increase in thick spit (phlegm). GET HELP RIGHT AWAY IF:  The inhaler medicine does not help your wheezing or shortness of breath or you have tightness in your chest.  You have dizziness, headaches, or fast heart rate.  You have chills, fever, or night sweats.  You have a large increase of thick spit, or your thick spit is  bloody. MAKE SURE YOU:   Understand these instructions.  Will watch your condition.  Will get help right away if you are not doing well or get worse.   This information is not intended to replace advice given to you by your health care provider. Make sure you discuss any questions you have with your health care provider.   Document Released: 07/15/2008 Document Revised: 07/27/2013 Document Reviewed: 05/05/2013 Elsevier Interactive Patient Education 2016 Else[employer] Inc[employer]  Hypoxemia Hypoxemia occurs when your blood does not contain enough oxygen. The body cannot work well when it does not have enough oxygen because every part of your body needs oxygen. Oxygen travels to all parts of the body through your blood. Hypoxemia can develop suddenly or can come on slowly. CAUSES Some common causes of hypoxemia include:  Long-term (chronic) lung diseases, such as chronic obstructive pulmonary disease (COPD) or interstitial lung disease.  Disorders that affect breathing at night, such as sleep apnea.  Fluid buildup in your lungs (pulmonary edema).  Lung infection (pneumonia).  Lung or throat cancer.  Abnormal blood flow that bypasses the lungs (shunt).  Certain diseasesthat affect nerves or muscles.  A collapsed lung (pneumothorax).  A blood clot in the lungs (pulmonary embolus).  Certain types of heart disease.  Slow or shallow breathing (hypoventilation).  Certain medicines.  High altitudes.  Toxic chemicals and gases. SIGNS AND SYMPTOMS Not everyone who has hypoxemia will develop symptoms. If the hypoxemia developed quickly, you will likely have symptoms such as shortness of breath. If the hypoxemia came on slowly over months or years, you may not notice any symptoms. Symptoms can include:  Shortness of breath (dyspnea).  Bluish color of the skin, lips, or nail beds.  Breathing that is fast, noisy, or shallow.  A fast heartbeat.  Feeling tired or sleepy.  Being  confused or feeling anxious. DIAGNOSIS To determine if you have hypoxemia, your health care provider may perform:  A physical exam.  Blood tests.  A pulse oximetry. A sensor will be put on your finger, toe, or earlobe to measure the percent of oxygen in your blood. TREATMENT You will likely be treated with oxygen therapy. Depending on the cause of your hypoxemia, you may need oxygen for a short time (weeks or months), or you may need it indefinitely. Your health care provider may also recommend other therapies to treat the underlying cause of your hypoxemia. HOME CARE INSTRUCTIONS  Only take over-the-counter or prescription medicines as directed by your health care provider.  Follow oxygen safety measures if you are on oxygen therapy. These may include:  Always having a backup supply of oxygen.  Not allowing anyone to smoke around oxygen.  Handling the oxygen tanks carefully and as instructed.  If you smoke, quit. Stay away from people who smoke.  Follow up with your health care provider as directed. SEEK MEDICAL CARE IF:  You have any concerns about your oxygen therapy.  You still have trouble breathing.  You become short of breath when you exercise.  You are tired when you wake up.  You have a headache when you wake up. SEEK IMMEDIATE MEDICAL CARE IF:   Your breathing gets worse.  You have new shortness of breath with normal activity.  You have a bluish color of the skin, lips, or nail beds.  You have confusion or cloudy thinking.  You cough up dark mucus.  You have chest pain.  You have a fever. MAKE SURE YOU:  Understand these instructions.  Will watch your condition.  Will get help right away if  you are not doing well or get worse.   This information is not intended to replace advice given to you by your health care provider. Make sure you discuss any questions you have with your health care provider.   Document Released: 04/21/2011 Document Revised:  10/11/2013 Document Reviewed: 05/05/2013 Elsevier Interactive Patient Education 2016 [place]. Oxygen Use at Home Oxygen can be prescribed for home use. The prescription will show the flow rate. This is how much oxygen is to be used per minute. This will be listed in liters per minute (LPM or L/M). A liter is a metric measurement of volume. You will use oxygen therapy as directed. It can be used while exercising, sleeping, or at rest. You may need oxygen continuously. Your health care provider may order a blood oxygen test (arterial blood gas or pulse oximetry test) that will show what your oxygen level is. Your health care provider will use these measurements to learn about your needs and follow your progress. Home oxygen therapy is commonly used on patients with various lung (pulmonary) related conditions. Some of these conditions include:  Asthma.  Lung cancer.  Pneumonia.  Emphysema.  Chronic bronchitis.  Cystic fibrosis.  Other lung diseases.  Pulmonary fibrosis.  Occupational lung disease.  Heart failure.  Chronic obstructive pulmonary disease (COPD). 3 COMMON WAYS OF PROVIDING OXYGEN THERAPY  Gas: The gas form of oxygen is put into variously sized cylinders or tanks. The cylinders or oxygen tanks contain compressed oxygen. The cylinder is equipped with a regulator that controls the flow rate. Because the flow of oxygen out of the cylinder is constant, an oxygen conserving device may be attached to the system to avoid waste. This device releases the gas only when you inhale and cuts it off when you exhale. Oxygen can be provided in a small cylinder that can be carried with you. Large tanks are heavy and are only for stationary use. After use, empty tanks must be exchanged for full tanks.  Liquid: The liquid form of oxygen is put into a container similar to a thermos. When released, the liquid converts to a gas and you breathe it in just like the compressed gas. This storage  method takes up less space than the compressed gas cylinder, and you can transfer the liquid to a small, portable vessel at home. Liquid oxygen is more expensive than the compressed gas, and the vessel vents when not in use. An oxygen conserving device may be built into the vessel to conserve the oxygen. Liquid oxygen is very cold, around 297 below zero.  Oxygen concentrator: This medical device filters oxygen from room air and gives almost 100% oxygen to the patient. Oxygen concentrators are powered by electricity. Benefits of this system are:  It does not need to be resupplied.  It is not as costly as liquid oxygen.  Extra tubing permits the user to move around easier. There are several types of small, portable oxygen systems available which can help you remain active and mobile. You must have a cylinder of oxygen as a backup in the event of a power failure. Advise your electric power company that you are on oxygen therapy in order to get priority service when there is a power failure. OXYGEN DELIVERY DEVICES There are 3 common ways to deliver oxygen to your body.  Nasal cannula. This is a 2-pronged device inserted in the nostrils that is connected to tubing carrying the oxygen. The tubing can rest on the ears or be attached to  the frame of eyeglasses.  Mask. People who need a high flow of oxygen generally use a mask.  Transtracheal catheter. Transtracheal oxygen therapy requires the insertion of a small, flexible tube (catheter) in the windpipe (trachea). This catheter is held in place by a necklace. Since transtracheal oxygen bypasses the mouth, nose, and throat, a humidifier is absolutely required at flow rates of 1 LPM or greater. OXYGEN USE SAFETY TIPS  Never smoke while using oxygen. Oxygen does not burn or explode, but flammable materials will burn faster in the presence of oxygen.  Keep a Data processing manager close by. Let your fire department know that you have oxygen in your  home.  Warn visitors not to smoke near you when you are using oxygen. Put up "no smoking" signs in your home where you most often use the oxygen.  When you go to a restaurant with your portable oxygen source, ask to be seated in the nonsmoking section.  Stay at least 5 feet away from gas stoves, candles, lighted fireplaces, or other heat sources.  Do not use materials that burn easily (flammable) while using your oxygen.  If you use an oxygen cylinder, make sure it is secured to some fixed object or in a stand. If you use liquid oxygen, make sure the vessel is kept upright to keep the oxygen from pouring out. Liquid oxygen is so cold it can hurt your skin.  If you use an oxygen concentrator, call your electric company so you will be given priority service if your power goes out. Avoid using extension cords, if possible.  Regularly test your smoke detectors at home to make sure they work. If you receive care in your home from a nurse or other health care provider, he or she may also check to make sure your smoke detectors work. GUIDELINES FOR CLEANING YOUR EQUIPMENT  Wash the nasal prongs with a liquid soap. Thoroughly rinse them once or twice a week.  Replace the prongs every 2 to 4 weeks. If you have an infection (cold, pneumonia) change them when you are well.  Your health care provider will give you instructions on how to clean your transtracheal catheter.  The humidifier bottle should be washed with soap and warm water and rinsed thoroughly between each refill. Air-dry the bottle before filling it with sterile or distilled water. The bottle and its top should be disinfected after they are cleaned.  If you use an oxygen concentrator, unplug the unit. Then wipe down the cabinet with a damp cloth and dry it daily. The air filter should be cleaned at least twice a week.  Follow your home medical equipment and service company's directions for cleaning the compressor filter. HOME CARE  INSTRUCTIONS   Do not change the flow of oxygen unless directed by your health care provider.  Do not use alcohol or other sedating drugs unless instructed. They slow your breathing rate.  Do not use materials that burn easily (flammable) while using your oxygen.  Always keep a spare tank of oxygen. Plan ahead for holidays when you may not be able to get a prescription filled.  Use water-based lubricants on your lips or nostrils. Do not use an oil-based product like petroleum jelly.  To prevent your cheeks or the skin behind your ears from becoming irritated, tuck some gauze under the tubing.  If you have persistent redness under your nose, call your health care provider.  When you no longer need oxygen, your doctor will have the oxygen discontinued.  Oxygen is not addicting or habit forming.  Use the oxygen as instructed. Too much oxygen can be harmful and too little will not give you the benefit you need.  Shortness of breath is not always from a lack of oxygen. If your oxygen level is not the cause of your shortness of breath, taking oxygen will not help. SEEK MEDICAL CARE IF:   You have frequent headaches.  You have shortness of breath or a lasting cough.  You have anxiety.  You are confused.  You are drowsy or sleepy all the time.  You develop an illness which aggravates your breathing.  You cannot exercise.  You are restless.  You have blue lips or fingernails.  You have difficult or irregular breathing and it is getting worse.  You have a fever.   This information is not intended to replace advice given to you by your health care provider. Make sure you discuss any questions you have with your health care provider.   Document Released: 12/27/2003 Document Revised: 10/27/2014 Document Reviewed: 05/18/2013 Elsevier Interactive Patient Education [employer].

## 2016-05-23 NOTE — Evaluation (Addendum)
Physical Therapy Evaluation Patient Details Name: Jasmine [name] MRN: [id number] DOB: 11/01/1945 Today's Date: 05/23/2016   History of Present Illness  70 yo female admitted with acute on chronic resp failure. hx of COPD, lung ca  Clinical Impression  On eval, pt was supervision level assist for mobility. She walked ~125 feet with RW. O2 sats 93% 3L O2 at rest, 90% 4L O2 with ambulation. Pt reports, at baseline, she uses 3L O2 at rest and 4L O2 with activity. Dyspnea 2/4. Pt tolerated distance well. Do not anticipate any follow up PT needs. Recommend daily ambulation with nursing supervision.     Follow Up Recommendations No PT follow up;Supervision - Intermittent    Equipment Recommendations  None recommended by PT    Recommendations for Other Services       Precautions / Restrictions Precautions Precautions: None Precaution Comments: O2 dep Restrictions Weight Bearing Restrictions: No      Mobility  Bed Mobility               General bed mobility comments: oob in recliner  Transfers Overall transfer level: Needs assistance Equipment used: Rolling walker (2 wheeled) Transfers: Sit to/from Stand Sit to Stand: Supervision         General transfer comment: cue for hand placement. no assist needed.   Ambulation/Gait Ambulation/Gait assistance: Supervision Ambulation Distance (Feet): 125 Feet Assistive device: Rolling walker (2 wheeled) Gait Pattern/deviations: Step-through pattern     General Gait Details: slow, steady gait. No LOB. O2 sats 90% on 4L O2 during ambulation. Pt reports using 4L for activity, at baseline.   Stairs            Wheelchair Mobility    Modified Rankin (Stroke Patients Only)       Balance                                             Pertinent Vitals/Pain Pain Assessment: No/denies pain    Home Living Family/patient expects to be discharged to:: Private residence Living Arrangements: Alone   Type  of Home: Assisted living (pt states they do not differentiate between the ALF/ILF???) Home Access: Level entry     Home Layout: One level Home Equipment: Walker - 4 wheels;Shower seat Additional Comments: O2 dep    Prior Function           Comments: pt usually walks to dining room but facility staff will bring meals to room if needed. staff assists with med management as well     Hand Dominance        Extremity/Trunk Assessment   Upper Extremity Assessment: Overall WFL for tasks assessed           Lower Extremity Assessment: Overall WFL for tasks assessed      Cervical / Trunk Assessment: Kyphotic  Communication   Communication: No difficulties  Cognition Arousal/Alertness: Awake/alert Behavior During Therapy: WFL for tasks assessed/performed Overall Cognitive Status: Within Functional Limits for tasks assessed                      General Comments      Exercises        Assessment/Plan    PT Assessment Patient needs continued PT services  PT Diagnosis Difficulty walking   PT Problem List Decreased mobility  PT Treatment Interventions Gait training;Functional mobility training;Therapeutic activities;Therapeutic exercise;DME instruction;Balance training  PT Goals (Current goals can be found in the Care Plan section) Acute Rehab PT Goals Patient Stated Goal: home soon.  PT Goal Formulation: With patient Time For Goal Achievement: 06/06/16 Potential to Achieve Goals: Good    Frequency Min 3X/week   Barriers to discharge        Co-evaluation               End of Session Equipment Utilized During Treatment: Oxygen;Gait belt Activity Tolerance: Patient tolerated treatment well Patient left: in chair;with call bell/phone within reach;with chair alarm set           Time: [id number] PT Time Calculation (min) (ACUTE ONLY): 18 min   Charges:   PT Evaluation $PT Eval Low Complexity: 1 Procedure     PT G Codes:        Weston Anna, MPT Pager: [phone]

## 2016-05-23 NOTE — Discharge Summary (Signed)
Physician Discharge Summary  Jasmine [name] [id number] DOB: 05-05-1946 DOA: 05/20/2016  PCP: Jasmine Cower, MD  Admit date: 05/20/2016 Discharge date: 05/23/2016  Ad[facility] From[name] Disposition:  [name]  Recommendations for Outpatient Follow-up:  1. Follow up with PCP in 1 weeks  Equipment/Devices: resume home oxygen  Discharge Condition: STABLE CODE STATUS: FULL   Brief/Interim Summary: Jasmine Jeffords Booneis a 70 y.o.femalewith medical history significant of COPD and lung cancer, on 4L [place] O2 at home. Patient reportshaving right-sided chest pain x 2-3 days. Slight SOB which started 3-4 days ago. Slight cough. Thought her sinuses were draining and making her cough a lot. EMS found her to be 68% on 4L O2, which improved to 100% with NRB.  ED Course:On NRB. RUL PNA - started on HCAP therapy (lives in assisted living, hospitalized in 6/17). PCO2 73.3 on ABG so placed on Bipap with improvement in ABG.   Assessment & Plan: Acute on chronic respiratory failure with hypercapnia Secondary to HCAP and COPD (Concern for aspiration Pneumonia) -Patient with SOB on admission, hypoxic when EMS arrived and placed on NRB and then BIPAP.  -Regardless, patient placed on BIPAP with good result.  Now has been weaned off bipap to [place] and doing better.   -Normal lactate, normal BP, did not meet criteria for sepsis -treated for HCAP based on assisted living as well as hospitalization within the last 90 days -Initially started on Cefepime and Vanc as per HCAP guidelines, now de-escalating antibiotics and home on augmentin for 5 more days. -anaerobic coverage for possible aspiration based on RUL location with augmentin -UDS positive for benzos and opioids and this would increase her risk for aspiration and she does have a h/o crack cocaine use as recently as 3/17 -Duonebs/Albuterol ordered as well as home Breo Ellipta - Discharge back to [facility].    Hypothyroidism -s/p RAI -Last TSH  was abnormal at 6.076 in 3/17 -TSH and free T4 WNL  Chronic diastolic heart failure, well compensated -Echo in 3/17 with preserved EF but grade 1 diastolic dysfunction -Does not appear to be in CHF at this time  Hyperglycemia / Prediabetes -A1c was 6.3 in March -Repeat A1c was 6.2%  Lung mass -Per 05/08/16 note by Dr. Julien [name], suspicion for low-grade adenoCA vs. Inflammatory process -Plan is for repeat CT in 3 months and/or possible bronchoscopy in the future -Follow up with Dr. Julien [name] as scheduled outpatient.   DVT prophylaxis:Lovenox Code Status:Full - confirmed with patient Family Communication:None at bedside Disposition Plan:Back to [facility] called:None  Discharge Diagnoses:  Principal Problem:   Acute on chronic respiratory failure with hypercapnia ([place]) Active Problems:   Hyponatremia   COPD (chronic obstructive pulmonary disease) (HCC)   Hypothyroidism following radioiodine therapy   Chronic diastolic congestive heart failure (HCC)   Lung mass   HCAP (healthcare-associated pneumonia)   Hyperglycemia  Discharge Instructions  Discharge Instructions    AMB Referral to [facility]    Complete by:  As directed   Please assign to [facility]. Currently active with [facility]. History of CHF, COPD, prediabetes. Written consent on file. Currently at [facility]. Please contact with questions. Thanks. Jasmine Rolling, MSN-Ed, [facility] [phone]   Reason for consult:  Please assign to [facility]. Currently active with [facility]   Diagnoses of:   COPD/ Pneumonia Heart Failure     Expected date of contact:  1-3 days (reserved for hospital discharges)   Increase activity slowly  Complete by:  As directed       Medication List    STOP taking these medications   HYDROcodone-homatropine 5-1.5 MG/5ML syrup Commonly known as:  HYCODAN     TAKE  these medications   albuterol (2.5 MG/3ML) 0.083% nebulizer solution Commonly known as:  PROVENTIL Take 3 mLs (2.5 mg total) by nebulization every 4 (four) hours as needed for wheezing.   albuterol 108 (90 Base) MCG/ACT inhaler Commonly known as:  PROVENTIL HFA;VENTOLIN HFA Inhale 2 puffs into the lungs every 6 (six) hours as needed for wheezing or shortness of breath.   ALPRAZolam 0.25 MG tablet Commonly known as:  XANAX Take 1 tablet (0.25 mg total) by mouth 2 (two) times daily as needed for anxiety.   amoxicillin-clavulanate 875-125 MG tablet Commonly known as:  AUGMENTIN Take 1 tablet by mouth every 12 (twelve) hours.   aspirin 81 MG EC tablet Take 1 tablet (81 mg total) by mouth daily.   citalopram 20 MG tablet Commonly known as:  CELEXA Take 1 tablet (20 mg total) by mouth daily.   feeding supplement (ENSURE ENLIVE) Liqd Take 237 mLs by mouth 3 (three) times daily between meals.   fluticasone furoate-vilanterol 200-25 MCG/INH Aepb Commonly known as:  BREO ELLIPTA Inhale 1 puff into the lungs daily.   furosemide 40 MG tablet Commonly known as:  LASIX Take 1 tablet (40 mg total) by mouth 2 (two) times daily.   levothyroxine 75 MCG tablet Commonly known as:  SYNTHROID, LEVOTHROID Take 1 tablet (75 mcg total) by mouth daily before breakfast.   OXYGEN Inhale 4 L into the lungs continuous.   potassium chloride 10 MEQ tablet Commonly known as:  K-DUR,KLOR-CON Take 10 mEq by mouth 2 (two) times daily.   tiotropium 18 MCG inhalation capsule Commonly known as:  SPIRIVA HANDIHALER Place 1 capsule (18 mcg total) into inhaler and inhale daily.      Follow-up Information    Jasmine Cower, MD. Schedule an appointment as soon as possible for a visit in 1 week(s).   Specialties:  Internal Medicine, Radiology Why:  Hospital Follow Up  Contact information: [place] [address] [phone]          Allergies  Allergen Reactions  . Alendronate  Sodium Other (See Comments)    Pt does not remember this reaction   Procedures/Studies: Ct Angio Chest Pe W/cm &/or Wo Cm  Result Date: 05/20/2016 CLINICAL DATA:  Increasing shortness of breath. Possible lung cancer. History of asthma, tobacco abuse. EXAM: CT ANGIOGRAPHY CHEST WITH CONTRAST TECHNIQUE: Multidetector CT imaging of the chest was performed using the standard protocol during bolus administration of intravenous contrast. Multiplanar CT image reconstructions and MIPs were obtained to evaluate the vascular anatomy. CONTRAST:  100 cc Isovue 370 COMPARISON:  PET- CT May 07, 2016 and CT chest March 24, 2016 FINDINGS: PULMONARY ARTERY: Adequate contrast opacification of the pulmonary artery's. Main pulmonary artery is not enlarged. No pulmonary arterial filling defects to the level of the subsegmental branches. MEDIASTINUM: Heart and pericardium are unremarkable, no right heart strain. Mild coronary artery calcifications. Thoracic aorta is normal course and caliber, unremarkable. Stable mild RIGHT hilar lymphadenopathy, not hypermetabolic on prior PET-CT. LUNGS: Debris within bilateral lower lobe bronchi. No pneumothorax. Hypo enhancing dense consolidation RIGHT upper lobe, posterior segment. Small RIGHT pleural effusion. Resolution of LEFT pleural effusion. Bilateral lower lobe enhancing atelectasis. Stable 10 mm RIGHT upper lobe spiculated nodule. Smaller less consolidated appearing LEFT upper lobe 10 mm  nodule. Mild centrilobular emphysema and increased lung volumes. Diffuse coarsened pulmonary interstitium. SOFT TISSUES AND OSSEOUS STRUCTURES: Included view of the abdomen is nonacute. Please note, adrenal glands not imaged. Visualized soft tissues and included osseous structures appear nonsuspicious. Punctate RIGHT breast calcification. Review of the MIP images confirms the above findings. IMPRESSION: RIGHT upper lobe pneumonia.  Small RIGHT pleural effusion. Stable 9 mm spiculated RIGHT upper lobe  pulmonary nodule. Decreased size and cons acuity of the LEFT upper lobe 10 mm pulmonary nodule. Debris within lower lobe bronchi concerning for aspiration. Electronically Signed   By: Elon Alas M.D.   On: 05/20/2016 23:48   Nm Pet Image Initial (pi) Skull Base To Thigh  Result Date: 05/07/2016 CLINICAL DATA:  Initial treatment strategy for bilateral pulmonary nodules. EXAM: NUCLEAR MEDICINE PET SKULL BASE TO THIGH TECHNIQUE: 5.4 mCi F-18 FDG was injected intravenously. Full-ring PET imaging was performed from the skull base to thigh after the radiotracer. CT data was obtained and used for attenuation correction and anatomic localization. FASTING BLOOD GLUCOSE:  Value: 112 mg/dl COMPARISON:  Chest CTA on 03/24/2016 and chest CT on 11/11/2011 FINDINGS: NECK No hypermetabolic lymph nodes in the neck. CHEST No hypermetabolic mediastinal or hilar nodes. Mild emphysema noted. Bilateral pleural effusions and bilateral lower lobe atelectasis have resolved since previous study. Spiculated pulmonary nodule in the left upper lobe measuring 12 mm on image 24/8 is new since 2013 exam and shows low-grade metabolic activity with SUV max of 1.3. Smaller spiculated pulmonary nodule in the right upper lobe on image 29/8 measures 9 mm. This is also new since 2013 and shows low-grade metabolic activity with SUV max of 1.1. A third 7 mm pulmonary nodule is seen in the medial right lower lobe on image 44/8. This is also new since 2013 and shows low-grade metabolic activity with SUV max of 1.8. ABDOMEN/PELVIS No abnormal hypermetabolic activity within the liver, pancreas, adrenal glands, or spleen. No hypermetabolic lymph nodes in the abdomen or pelvis. Aortic atherosclerosis noted. SKELETON No focal hypermetabolic activity to suggest skeletal metastasis. IMPRESSION: Three small spiculated pulmonary nodules in left upper lobe, right upper lobe, and right lower lobe show low-grade metabolic activity. Low-grade synchronous  bronchogenic adenocarcinomas cannot be excluded. Consider tissue sampling or continued short-term followup by chest CT in 3 months. No evidence of thoracic nodal or distant metastatic disease. Mild emphysema. Aortic atherosclerosis noted. Electronically Signed   By: Earle Gell M.D.   On: 05/07/2016 14:41   Dg Chest Port 1 View  Result Date: 05/21/2016 CLINICAL DATA:  Mid chest pain, and discomfort with productive cough and congestion. EXAM: PORTABLE CHEST 1 VIEW COMPARISON:  Chest CT 05/20/2016. FINDINGS: Cardiomegaly. Calcified tortuous aorta. Coarsened interstitium, likely COPD with superimposed edema. Focal infiltrate RIGHT upper lobe suggesting acute pneumonia. No effusion or pneumothorax. IMPRESSION: Cardiomegaly.  Acute RIGHT lower lobe pneumonia. Electronically Signed   By: Staci Righter M.D.   On: 05/21/2016 09:59   Dg Chest Port 1 View  Result Date: 05/20/2016 CLINICAL DATA:  Shortness of breath, onset today. EXAM: PORTABLE CHEST 1 VIEW COMPARISON:  PET-CT 05/07/2016 reviewed. Most recent radiographs 03/26/2016 FINDINGS: No opacity in the right midlung zone likely localizing to the anterior right upper lobe from recent prior PET-CT. The small spiculated nodules in both lungs are not well seen radiographically. Background emphysema. Minimal blunting of right costophrenic angle, may be small fusion, incompletely occluded in the field of view. Unchanged mild cardiomegaly and unchanged mediastinal contours from prior exam. No pneumothorax. No evidence  pulmonary edema. IMPRESSION: 1. New right mid lung zone opacity likely in the anterior upper lobe, suspicious for pneumonia. 2. The small bilateral pulmonary nodules on recent PET-CT are not well seen radiographically. 3. Stable cardiomegaly.  Emphysema. Electronically Signed   By: Jeb Levering M.D.   On: 05/20/2016 21:20     Subjective: Pt says she feels much better and breathing back to her baseline.    Discharge Exam: Vitals:   05/22/16 2108  05/23/16 0528  BP: (!) 155/66 95/61  Pulse: 95 86  Resp: 19 18  Temp: 98.6 F (37 C) 98.8 F (37.1 C)   Vitals:   05/22/16 1950 05/22/16 2108 05/23/16 0528 05/23/16 0825  BP:  (!) 155/66 95/61   Pulse:  95 86   Resp:  19 18   Temp:  98.6 F (37 C) 98.8 F (37.1 C)   TempSrc:  Oral Oral   SpO2: 100% 95% 97% 96%  Weight:      Height:       ? General:Appears calm and comfortable and is NAD ? Eyes:PERRL, EOMI, normal lids, iris ? [id number] normal hearing, lips &tongue, mmm ? Neck:no LAD, masses or thyromegaly ? Cardiovascular:RRR, no m/r/g. No LE edema.  ? Respiratory:coarse upper airway noises.  CTA bilaterally, no w/r/r. Normal respiratory effort. ? Abdomen:soft, ntnd, NABS ? Skin:no rash or induration seen on limited exam ? Musculoskeletal:grossly normal tone BUE/BLE, good ROM, no bony abnormality ? Psychiatric:grossly normal mood and affect, speech fluent and appropriate, AOx3. Somewhat poor historian - uncertain if this is her norm or due to BIPAP ? Neurologic:CN 2-12 grossly intact, moves all extremities in coordinated fashion, sensation intact   The results of significant diagnostics from this hospitalization (including imaging, microbiology, ancillary and laboratory) are listed below for reference.     Microbiology: Recent Results (from the past 240 hour(s))  Culture, blood (routine x 2)     Status: None (Preliminary result)   Collection Time: 05/20/16 10:09 PM  Result Value Ref Range Status   Specimen Description BLOOD LEFT WRIST  Final   Special Requests BOTTLES DRAWN AEROBIC AND ANAEROBIC 5CC  Final   Culture   Final    NO GROWTH 1 DAY Performed at [facility]    Report Status PENDING  Incomplete  Culture, blood (routine x 2)     Status: None (Preliminary result)   Collection Time: 05/20/16 10:09 PM  Result Value Ref Range Status   Specimen Description BLOOD LEFT ANTECUBITAL  Final   Special Requests IN PEDIATRIC BOTTLE 5CC   Final   Culture   Final    NO GROWTH 1 DAY Performed at [facility]    Report Status PENDING  Incomplete  MRSA PCR Screening     Status: None   Collection Time: 05/21/16  5:54 AM  Result Value Ref Range Status   MRSA by PCR NEGATIVE NEGATIVE Final    Comment:        The GeneXpert MRSA Assay (FDA approved for NASAL specimens only), is one component of a comprehensive MRSA colonization surveillance program. It is not intended to diagnose MRSA infection nor to guide or monitor treatment for MRSA infections.   Culture, sputum-assessment     Status: None   Collection Time: 05/21/16  8:00 PM  Result Value Ref Range Status   Specimen Description SPUTUM  Final   Special Requests NONE  Final   Sputum evaluation   Final    THIS SPECIMEN IS ACCEPTABLE. RESPIRATORY CULTURE REPORT TO  FOLLOW.   Report Status 05/21/2016 FINAL  Final  Culture, respiratory (NON-Expectorated)     Status: None (Preliminary result)   Collection Time: 05/21/16  8:00 PM  Result Value Ref Range Status   Specimen Description SPUTUM  Final   Special Requests NONE  Final   Gram Stain   Final    ABUNDANT WBC PRESENT,BOTH PMN AND MONONUCLEAR FEW GRAM POSITIVE COCCI IN PAIRS Performed at [facility]    Culture PENDING  Incomplete   Report Status PENDING  Incomplete     Labs: BNP (last 3 results)  Recent Labs  12/24/15 2000 03/24/16 0910 05/20/16 2124  BNP 26.7 473.0* [id number]   Basic Metabolic Panel:  Recent Labs Lab 05/20/16 2124 05/21/16 0339 05/22/16 0404  NA 134* 134* 135  K 4.3 3.8 4.4  CL 86* 89* 95*  CO2 41* 39* 33*  GLUCOSE 121* 119* 107*  BUN 17 13 14   CREATININE 0.95 0.65 0.53  CALCIUM 9.2 8.8* 9.1   Liver Function Tests:  Recent Labs Lab 05/22/16 0404  AST 20  ALT 12*  ALKPHOS 48  BILITOT 0.9  PROT 6.5  ALBUMIN 3.1*   No results for input(s): LIPASE, AMYLASE in the last 168 hours. No results for input(s): AMMONIA in the last 168 hours. CBC:  Recent  Labs Lab 05/20/16 2124 05/21/16 0339 05/22/16 0404  WBC 10.1 8.5 8.5  NEUTROABS 7.5 5.7 5.5  HGB 13.4 12.6 12.5  HCT 42.4 40.8 38.6  MCV 91.8 91.9 90.6  PLT 256 245 245   Cardiac Enzymes:  Recent Labs Lab 05/21/16 0906 05/21/16 1512 05/21/16 2135  TROPONINI <0.03 <0.03 <0.03   BNP: Invalid input(s): POCBNP CBG: No results for input(s): GLUCAP in the last 168 hours. D-Dimer No results for input(s): DDIMER in the last 72 hours. Hgb A1c  Recent Labs  05/21/16 0339  HGBA1C 6.2*   Lipid Profile No results for input(s): CHOL, HDL, LDLCALC, TRIG, CHOLHDL, LDLDIRECT in the last 72 hours. Thyroid function studies  Recent Labs  05/21/16 0339  TSH 0.659   Anemia work up No results for input(s): VITAMINB12, FOLATE, FERRITIN, TIBC, IRON, RETICCTPCT in the last 72 hours. Urinalysis    Component Value Date/Time   COLORURINE YELLOW 12/28/2015 0049   APPEARANCEUR CLOUDY (A) 12/28/2015 0049   LABSPEC 1.025 12/28/2015 0049   PHURINE 7.5 12/28/2015 0049   GLUCOSEU 100 (A) 12/28/2015 0049   GLUCOSEU NEGATIVE 01/11/2015 1723   HGBUR NEGATIVE 12/28/2015 0049   BILIRUBINUR NEGATIVE 12/28/2015 0049   BILIRUBINUR negative 08/11/2011 1358   KETONESUR NEGATIVE 12/28/2015 0049   PROTEINUR 100 (A) 12/28/2015 0049   UROBILINOGEN 0.2 01/11/2015 1723   NITRITE NEGATIVE 12/28/2015 0049   LEUKOCYTESUR SMALL (A) 12/28/2015 0049   Sepsis Labs Invalid input(s): PROCALCITONIN,  WBC,  LACTICIDVEN Microbiology Recent Results (from the past 240 hour(s))  Culture, blood (routine x 2)     Status: None (Preliminary result)   Collection Time: 05/20/16 10:09 PM  Result Value Ref Range Status   Specimen Description BLOOD LEFT WRIST  Final   Special Requests BOTTLES DRAWN AEROBIC AND ANAEROBIC 5CC  Final   Culture   Final    NO GROWTH 1 DAY Performed at [facility]    Report Status PENDING  Incomplete  Culture, blood (routine x 2)     Status: None (Preliminary result)    Collection Time: 05/20/16 10:09 PM  Result Value Ref Range Status   Specimen Description BLOOD LEFT ANTECUBITAL  Final   Special  Requests IN PEDIATRIC BOTTLE 5CC  Final   Culture   Final    NO GROWTH 1 DAY Performed at [facility]    Report Status PENDING  Incomplete  MRSA PCR Screening     Status: None   Collection Time: 05/21/16  5:54 AM  Result Value Ref Range Status   MRSA by PCR NEGATIVE NEGATIVE Final    Comment:        The GeneXpert MRSA Assay (FDA approved for NASAL specimens only), is one component of a comprehensive MRSA colonization surveillance program. It is not intended to diagnose MRSA infection nor to guide or monitor treatment for MRSA infections.   Culture, sputum-assessment     Status: None   Collection Time: 05/21/16  8:00 PM  Result Value Ref Range Status   Specimen Description SPUTUM  Final   Special Requests NONE  Final   Sputum evaluation   Final    THIS SPECIMEN IS ACCEPTABLE. RESPIRATORY CULTURE REPORT TO FOLLOW.   Report Status 05/21/2016 FINAL  Final  Culture, respiratory (NON-Expectorated)     Status: None (Preliminary result)   Collection Time: 05/21/16  8:00 PM  Result Value Ref Range Status   Specimen Description SPUTUM  Final   Special Requests NONE  Final   Gram Stain   Final    ABUNDANT WBC PRESENT,BOTH PMN AND MONONUCLEAR FEW GRAM POSITIVE COCCI IN PAIRS Performed at [facility]    Culture PENDING  Incomplete   Report Status PENDING  Incomplete   Time coordinating discharge: 36 minutes  SIGNED:  Irwin Brakeman, MD  [facility] 05/23/2016, 11:46 AM Pager   If 7PM-7AM, please contact night-coverage [email or web address] Password [email or web address]

## 2016-05-23 NOTE — Care Management Note (Signed)
Case Management Note  Patient Details  Name: Jasmine [name] MRN: [id number] Date of Birth: May 15, 1946  Subjective/Objective:PT-no f/u. Already has home 02.CSW following for d/c back to ALF. Used [facility] in past.No CM needs.                    Action/Plan:d/c ALF no needs or orders.   Expected Discharge Date:   (unknown)               Expected Discharge Plan:  Assisted Living / Rest Home  In-House Referral:  Clinical Social Work  Discharge planning Services  CM Consult  Post Acute Care Choice:    Choice offered to:     DME Arranged:    DME Agency:     HH Arranged:    [place] Agency:     Status of Service:  Completed, signed off  If discussed at [employer], dates discussed:    Additional Comments:  Dessa Phi, RN 05/23/2016, 11:40 AM

## 2016-05-23 NOTE — NC FL2 (Signed)
[place] LEVEL OF CARE SCREENING TOOL     IDENTIFICATION  Patient Name: Jasmine [name] Birthdate: [birth year] Sex: female Admission Date (Current Location): 05/20/2016  [facility] and [place] Number:  Herbalist and Address:  [facility],  [place] [address] [place]      Provider Number: [id number]  Attending Physician Name and Address:  Murlean Iba, MD  Relative Name and Phone Number:       Current Level of Care: Hospital Recommended Level of Care: [place] Prior Approval Number:    Date Approved/Denied:   PASRR Number: [id number]  Discharge Plan:      Current Diagnoses: Patient Active Problem List   Diagnosis Date Noted  . HCAP (healthcare-associated pneumonia) 05/21/2016  . Hyperglycemia 05/21/2016  . Lung mass 03/24/2016  . Abnormal LFTs 03/24/2016  . Elevated blood sugar 03/24/2016  . Acute on chronic respiratory failure with hypoxemia ([place])   . Bilateral pleural effusion   . Solitary pulmonary nodule 01/09/2016  . Chronic respiratory failure with hypoxia ([place])   . On home oxygen therapy   . Chronic diastolic congestive heart failure ([place])   . Staphylococcus aureus bacteremia 12/28/2015  . Bacteremia Step viridans 12/27/2015  . Hypomagnesemia 12/25/2015  . Acute on chronic respiratory failure ([place]) 12/25/2015  . Tobacco abuse disorder 12/25/2015  . Acute on chronic respiratory failure with hypercapnia ([place]) 11/30/2014  . Hypothyroidism following radioiodine therapy 09/26/2014  . Protein-calorie malnutrition, severe ([place]) 06/23/2014  . Cor pulmonale ([place]) 01/12/2013  . COPD (chronic obstructive pulmonary disease) ([place]) 11/29/2011  . Hyponatremia 11/09/2011  . Macrocytosis without anemia 11/09/2011  . SINUSITIS, CHRONIC 10/10/2010  . FATIGUE 10/10/2010  . Acute sinus infection 10/10/2008  . Depression with anxiety 07/21/2008  . Essential hypertension 05/01/2007  . ALLERGIC RHINITIS  05/01/2007  . ASTHMA 05/01/2007  . OSTEOPOROSIS 05/01/2007    Orientation RESPIRATION BLADDER Height & Weight     Self, Place, Time, Situation  O2 (3L) Continent Weight: 110 lb 10.7 oz (50.2 kg) Height:  5' (152.4 cm)  BEHAVIORAL SYMPTOMS/MOOD NEUROLOGICAL BOWEL NUTRITION STATUS      Continent Diet (Carb Modified)  AMBULATORY STATUS COMMUNICATION OF NEEDS Skin   Supervision Verbally Normal                       Personal Care Assistance Level of Assistance              Functional Limitations Info             SPECIAL CARE FACTORS FREQUENCY                       Contractures      Additional Factors Info  Code Status, Allergies, Psychotropic Code Status Info: Fullcode Allergies Info: Allergies:  Alendronate Sodium           Discharge Medications: STOP taking these medications   HYDROcodone-homatropine 5-1.5 MG/5ML syrup Commonly known as:  HYCODAN    TAKE these medications   albuterol (2.5 MG/3ML) 0.083% nebulizer solution Commonly known as:  PROVENTIL Take 3 mLs (2.5 mg total) by nebulization every 4 (four) hours as needed for wheezing.  albuterol 108 (90 Base) MCG/ACT inhaler Commonly known as:  PROVENTIL HFA;VENTOLIN HFA Inhale 2 puffs into the lungs every 6 (six) hours as needed for wheezing or shortness of breath.  ALPRAZolam 0.25 MG tablet Commonly known as:  XANAX Take 1 tablet (0.25 mg total) by  mouth 2 (two) times daily as needed for anxiety.  amoxicillin-clavulanate 875-125 MG tablet Commonly known as:  AUGMENTIN Take 1 tablet by mouth every 12 (twelve) hours.  aspirin 81 MG EC tablet Take 1 tablet (81 mg total) by mouth daily.  citalopram 20 MG tablet Commonly known as:  CELEXA Take 1 tablet (20 mg total) by mouth daily.  feeding supplement (ENSURE ENLIVE) Liqd Take 237 mLs by mouth 3 (three) times daily between meals.  fluticasone furoate-vilanterol 200-25 MCG/INH Aepb Commonly known as:  BREO ELLIPTA Inhale 1 puff into the  lungs daily.  furosemide 40 MG tablet Commonly known as:  LASIX Take 1 tablet (40 mg total) by mouth 2 (two) times daily.  levothyroxine 75 MCG tablet Commonly known as:  SYNTHROID, LEVOTHROID Take 1 tablet (75 mcg total) by mouth daily before breakfast.  OXYGEN Inhale 4 L into the lungs continuous.  potassium chloride 10 MEQ tablet Commonly known as:  K-DUR,KLOR-CON Take 10 mEq by mouth 2 (two) times daily.  tiotropium 18 MCG inhalation capsule Commonly known as:  SPIRIVA HANDIHALER Place 1 capsule (18 mcg total) into inhaler and inhale daily.    Relevant Imaging Results:  Relevant Lab Results:   Additional Information SS#: [id number]  Standley Brooking, LCSW

## 2016-05-23 NOTE — Progress Notes (Signed)
Patient is set to discharge back to [facility] today. Patient & niece, Colletta [place] made aware. Discharge packet given to RN, Pam. Patient's niece, Colletta [place] to transport back to ALF.     Raynaldo Opitz, [facility] Clinical Social Worker cell #: [phone]

## 2016-05-24 LAB — CULTURE, RESPIRATORY: CULTURE: NORMAL

## 2016-05-24 LAB — CULTURE, RESPIRATORY W GRAM STAIN

## 2016-05-25 DIAGNOSIS — J449 Chronic obstructive pulmonary disease, unspecified: Secondary | ICD-10-CM | POA: Diagnosis not present

## 2016-05-26 DIAGNOSIS — F321 Major depressive disorder, single episode, moderate: Secondary | ICD-10-CM | POA: Diagnosis not present

## 2016-05-26 DIAGNOSIS — F419 Anxiety disorder, unspecified: Secondary | ICD-10-CM | POA: Diagnosis not present

## 2016-05-26 LAB — CULTURE, BLOOD (ROUTINE X 2)
CULTURE: NO GROWTH
Culture: NO GROWTH

## 2016-05-27 ENCOUNTER — Other Ambulatory Visit: Payer: Self-pay

## 2016-05-27 NOTE — Patient Outreach (Signed)
[facility]) Care Management  05/27/2016  PAW CARBINE [birth year] [id number]   70 year old with recent admission for pneumonia. Member lives in independent living at a facility. Member reports that her medications are managed by the facility. Member reports she is feeling better and breathing better. Member states the staff at [facility] has the discharge instructions. RNCM reviewed discharge instructions with member. Member is on oxygen at 3liters per nasal cannula at rest and states that she increases it up to 4liters/nasal cannula as needed/with activity.  Follow up appointment. Member reports that she did not know that she was supposed to call primary care to schedule a visit. Member states she would call to schedule and states she has transportation.  Medications reviewed. Member's medications are managed by [facility]. Member was aware of each medication discussed.  Plan: RNCM will follow for transition of care. home visit scheduled.  Thea Silversmith, RN, MSN, [place] Coordinator Cell: [phone]

## 2016-06-03 ENCOUNTER — Other Ambulatory Visit: Payer: Self-pay

## 2016-06-03 DIAGNOSIS — F321 Major depressive disorder, single episode, moderate: Secondary | ICD-10-CM | POA: Diagnosis not present

## 2016-06-03 DIAGNOSIS — F419 Anxiety disorder, unspecified: Secondary | ICD-10-CM | POA: Diagnosis not present

## 2016-06-03 NOTE — Patient Outreach (Signed)
[facility]) Care Management  06/03/2016  Jasmine [name] [birth year] [id number]   RNCM had scheduled visit with member today. Member left note on the door that she is unable to meet care coordinator today.   Plan: RNCM will call to reschedule home visit.  Thea Silversmith, RN, MSN, [place] Coordinator Cell: [phone]

## 2016-06-05 ENCOUNTER — Ambulatory Visit (INDEPENDENT_AMBULATORY_CARE_PROVIDER_SITE_OTHER): Payer: Commercial Managed Care - HMO | Admitting: Internal Medicine

## 2016-06-05 ENCOUNTER — Other Ambulatory Visit: Payer: Self-pay

## 2016-06-05 DIAGNOSIS — Z9981 Dependence on supplemental oxygen: Secondary | ICD-10-CM | POA: Diagnosis not present

## 2016-06-05 DIAGNOSIS — H9193 Unspecified hearing loss, bilateral: Secondary | ICD-10-CM

## 2016-06-05 DIAGNOSIS — I10 Essential (primary) hypertension: Secondary | ICD-10-CM

## 2016-06-05 DIAGNOSIS — J189 Pneumonia, unspecified organism: Secondary | ICD-10-CM | POA: Diagnosis not present

## 2016-06-05 DIAGNOSIS — R918 Other nonspecific abnormal finding of lung field: Secondary | ICD-10-CM

## 2016-06-05 NOTE — Progress Notes (Signed)
Subjective:    Patient ID: Jasmine [name], female    DOB: 17-Jan-1946, 70 y.o.   MRN: [id number]  HPI  70 y.o.femalewith medical history significant of COPD and lung cancer, on 4L [place] O2 at home. Patient reportshaving right-sided chest pain x 2-3 days. Slight SOB which started 3-4 days ago. Slight cough. Thought her sinuses were draining and making her cough a lot. EMS found her to be 68% on 4L O2, which improved to 100% with NRB.RUL PNA - started on HCAP therapy (lives in assisted living, hospitalized in 6/17). PCO2 73.3 on ABG so placed on Bipap with improvement in ABG. Pt clinically improved, d/c on agumentin and breo, discharged back to [facility], Also has Lung mass -Per 05/08/16 note by Dr. Julien Nordmann, suspicion for low-grade adenoCA vs. Inflammatory process -Plan is for repeat CT in 3 months and/or possible bronchoscopy in the future.  -To Follow up with Dr. Julien Nordmann as scheduled outpatient  Today doing better, states only uses 3L [place] at home, 4 when out of the house.  Cough much improved, less productive, no longer dark - now more clear.  No hemoptysis, but will occas have small blood from nose.  Stopped smoking late march, and quit cocaine at that time as well.  Has had one drink of tequila 1 mo ago, none since.  Does today have bialt ear wax impactions like before and reduced hearing, hoping for irrigations.    Past Medical History:  Diagnosis Date  . ALLERGIC RHINITIS 05/01/2007  . Anxiety   . Arthritis    "hands" (12/28/2015)  . ASTHMA 05/01/2007   "since I was a child"  . Cancer (HCC)    LUNG  . CHF (congestive heart failure) ([place])   . Chronic bronchitis ([place])   . COPD (chronic obstructive pulmonary disease) ([place])   . DEPRESSION 07/21/2008  . DVT (deep venous thrombosis) (HCC) 1960s   LLE  . FATIGUE 10/10/2010  . HYPERTENSION 05/01/2007  . HYPERTHYROIDISM 11/23/2007   Pt endorses having had Graves disease, possibly radioactive iodine x1, but no thyroidectomy  . HYPOTHYROIDISM  08/23/2009  . LUMBAR RADICULOPATHY, RIGHT 08/25/2008  . On home oxygen therapy    "3-4L; qd; all the time" (12/28/2015)  . OSTEOPOROSIS 05/01/2007  . SHOULDER PAIN, LEFT 08/23/2009  . SINUSITIS, CHRONIC 10/10/2010   Past Surgical History:  Procedure Laterality Date  . APPENDECTOMY    . DILATION AND CURETTAGE OF UTERUS  multiple   history of multiple dialations and curettages and miscarriages, unfortunately never carrying a child to term  . ECTOPIC PREGNANCY SURGERY  "late '60s or early '70s  . ELECTROCARDIOGRAM  06/20/2006  . OOPHORECTOMY Right     reports that she quit smoking about 10 months ago. Her smoking use included Cigarettes. She has a 54.00 pack-year smoking history. She has never used smokeless tobacco. She reports that she drinks alcohol. She reports that she does not use drugs. family history includes Alcohol abuse in her brother; Diabetes in her brother; Hypertension in her other; Lung cancer in her father; Stroke in her mother; Thyroid disease in her mother. Allergies  Allergen Reactions  . Alendronate Sodium Other (See Comments)    Pt does not remember this reaction   Current Outpatient Prescriptions on File Prior to Visit  Medication Sig Dispense Refill  . albuterol (PROVENTIL HFA;VENTOLIN HFA) 108 (90 Base) MCG/ACT inhaler Inhale 2 puffs into the lungs every 6 (six) hours as needed for wheezing or shortness of breath. 1 Inhaler 3  . albuterol (  PROVENTIL) (2.5 MG/3ML) 0.083% nebulizer solution Take 3 mLs (2.5 mg total) by nebulization every 4 (four) hours as needed for wheezing. 75 mL 12  . ALPRAZolam (XANAX) 0.25 MG tablet Take 1 tablet (0.25 mg total) by mouth 2 (two) times daily as needed for anxiety. 10 tablet 0  . aspirin EC 81 MG EC tablet Take 1 tablet (81 mg total) by mouth daily. 30 tablet 0  . citalopram (CELEXA) 20 MG tablet Take 1 tablet (20 mg total) by mouth daily. 90 tablet 3  . feeding supplement, ENSURE ENLIVE, (ENSURE ENLIVE) LIQD Take 237 mLs by mouth 3  (three) times daily between meals. 237 mL 12  . fluticasone furoate-vilanterol (BREO ELLIPTA) 200-25 MCG/INH AEPB Inhale 1 puff into the lungs daily. 1 each 5  . furosemide (LASIX) 40 MG tablet Take 1 tablet (40 mg total) by mouth 2 (two) times daily. 30 tablet 3  . levothyroxine (SYNTHROID, LEVOTHROID) 75 MCG tablet Take 1 tablet (75 mcg total) by mouth daily before breakfast. 30 tablet 0  . OXYGEN Inhale 4 L into the lungs continuous.     . potassium chloride (K-DUR,KLOR-CON) 10 MEQ tablet Take 10 mEq by mouth 2 (two) times daily.    [name] tiotropium (SPIRIVA HANDIHALER) 18 MCG inhalation capsule Place 1 capsule (18 mcg total) into inhaler and inhale daily. 30 capsule 2   No current facility-administered medications on file prior to visit.     Review of Systems  Constitutional: Negative for unusual diaphoresis or night sweats HENT: Negative for ear swelling or discharge Eyes: Negative for worsening visual haziness  Respiratory: Negative for choking and stridor.   Gastrointestinal: Negative for distension or worsening eructation Genitourinary: Negative for retention or change in urine volume.  Musculoskeletal: Negative for other MSK pain or swelling Skin: Negative for color change and worsening wound Neurological: Negative for tremors and numbness other than noted  Psychiatric/Behavioral: Negative for decreased concentration or agitation other than above       Objective:   Physical Exam BP 118/62   Pulse 63   Temp 98 F (36.7 C) (Oral)   Resp 20   Wt 114 lb (51.7 kg)   SpO2 97%   BMI 22.26 kg/m  VS noted,  Constitutional: Pt appears in no apparent distress HENT: Head: NCAT.  Right Ear: External ear normal.  Left Ear: External ear normal.  bilat wax impactions irrigated clear, hearing improved left > right Eyes: . Pupils are equal, round, and reactive to light. Conjunctivae and EOM are normal Neck: Normal range of motion. Neck supple.  Cardiovascular: Normal rate and regular  rhythm.   Pulmonary/Chest: Effort normal and breath sounds without rales or wheezing.  Abd:  Soft, NT, ND, + BS Neurological: Pt is alert. Not confused , motor grossly intact Skin: Skin is warm. No rash, trace LLE edema Psychiatric: Pt behavior is normal. No agitation.   PORTABLE CHEST 1 VIEW - 05-20-16 IMPRESSION: Cardiomegaly.  Acute RIGHT lower lobe pneumonia. Lab Results  Component Value Date   WBC 8.5 05/22/2016   HGB 12.5 05/22/2016   HCT 38.6 05/22/2016   PLT 245 05/22/2016   GLUCOSE 107 (H) 05/22/2016   CHOL 184 01/11/2015   TRIG 60.0 01/11/2015   HDL 84.30 01/11/2015   LDLDIRECT 102.1 10/10/2010   LDLCALC 88 01/11/2015   ALT 12 (L) 05/22/2016   AST 20 05/22/2016   NA 135 05/22/2016   K 4.4 05/22/2016   CL 95 (L) 05/22/2016   CREATININE 0.53 05/22/2016   BUN  14 05/22/2016   CO2 33 (H) 05/22/2016   TSH 0.659 05/21/2016   INR 1.58 (H) 03/25/2016   HGBA1C 6.2 (H) 05/21/2016       Assessment & Plan:

## 2016-06-05 NOTE — Assessment & Plan Note (Signed)
stable overall by history and exam, recent data reviewed with pt, and pt to continue medical treatment as before,  to f/u any worsening symptoms or concerns @LASTSAO2(3)@  

## 2016-06-05 NOTE — Patient Instructions (Signed)
Please continue all other medications as before, and refills have been done if requested.  Please have the pharmacy call with any other refills you may need.  Please continue your efforts at being more active, low cholesterol diet, and weight control.  You are otherwise up to date with prevention measures today.  Please keep your appointments with your specialists as you may have planned  Please return in 3 months, or sooner if needed 

## 2016-06-05 NOTE — Assessment & Plan Note (Signed)
Improved with irrigation, no further ENT needed

## 2016-06-05 NOTE — Progress Notes (Signed)
Pre visit review using our clinic review tool, if applicable. No additional management support is needed unless otherwise documented below in the visit note. 

## 2016-06-05 NOTE — Patient Outreach (Signed)
[facility]) Care Management  [place]  06/05/2016   Jasmine [name] 1946/05/14 [id number]  Subjective: "I feel ok"  Objective: BP 112/60   Pulse 61   Resp 16   Ht 1.524 m (5')   Wt 112 lb 9.6 oz (51.1 kg) Comment: after eating  SpO2 94% Comment: oxygen at 3liters/nasal cannula  BMI 21.99 kg/m , lungs decreased, heart rate regular, slight edema 1+ noted to lower leg/feet. Left greater than right.   Encounter Medications:  Outpatient Encounter Prescriptions as of 06/05/2016  Medication Sig  . albuterol (PROVENTIL HFA;VENTOLIN HFA) 108 (90 Base) MCG/ACT inhaler Inhale 2 puffs into the lungs every 6 (six) hours as needed for wheezing or shortness of breath.  [name] albuterol (PROVENTIL) (2.5 MG/3ML) 0.083% nebulizer solution Take 3 mLs (2.5 mg total) by nebulization every 4 (four) hours as needed for wheezing.  [name] ALPRAZolam (XANAX) 0.25 MG tablet Take 1 tablet (0.25 mg total) by mouth 2 (two) times daily as needed for anxiety.  [name] amoxicillin-clavulanate (AUGMENTIN) 875-125 MG tablet Take 1 tablet by mouth 2 (two) times daily.  [name] aspirin EC 81 MG EC tablet Take 1 tablet (81 mg total) by mouth daily.  . citalopram (CELEXA) 20 MG tablet Take 1 tablet (20 mg total) by mouth daily.  . feeding supplement, ENSURE ENLIVE, (ENSURE ENLIVE) LIQD Take 237 mLs by mouth 3 (three) times daily between meals.  . fluticasone furoate-vilanterol (BREO ELLIPTA) 200-25 MCG/INH AEPB Inhale 1 puff into the lungs daily.  . furosemide (LASIX) 40 MG tablet Take 1 tablet (40 mg total) by mouth 2 (two) times daily.  [name] levothyroxine (SYNTHROID, LEVOTHROID) 75 MCG tablet Take 1 tablet (75 mcg total) by mouth daily before breakfast.  . OXYGEN Inhale 4 L into the lungs continuous.   . potassium chloride (K-DUR,KLOR-CON) 10 MEQ tablet Take 10 mEq by mouth 2 (two) times daily.  [name] tiotropium (SPIRIVA HANDIHALER) 18 MCG inhalation capsule Place 1 capsule (18 mcg total) into inhaler and inhale daily.    No facility-administered encounter medications on file as of 06/05/2016.     Functional Status:  In your present state of health, do you have any difficulty performing the following activities: 06/05/2016 05/21/2016  Hearing? [name]  Vision? N N  Difficulty concentrating or making decisions? N N  Walking or climbing stairs? N Y  Dressing or bathing? N N  Doing errands, shopping? N N  Preparing Food and eating ? N -  Using the Toilet? N -  In the past six months, have you accidently leaked urine? N -  Do you have problems with loss of bowel control? N -  Managing your Medications? N -  Managing your Finances? N -  Housekeeping or managing your Housekeeping? N -  Some recent data might be hidden    Fall/Depression Screening: PHQ 2/9 Scores 06/05/2016 04/01/2016 03/11/2016 02/13/2016 02/11/2016 01/09/2016 07/11/2013  PHQ - 2 Score 1 1 1 1  0 1 3  PHQ- 9 Score - - - - - - 4   Fall Risk  06/05/2016 05/13/2016 04/01/2016 03/11/2016 02/13/2016  Falls in the past year? No No No No No  Risk for fall due to : - Impaired balance/gait Impaired balance/gait Impaired balance/gait Impaired balance/gait    Assessment: 70 year old with recent admisstion for pneumonia, on oxygen at 3l/[place] continuous, member reports she has protable oxygen that she uses at 4l/[place] when she goes out.  Recent losses-husband, moving into [place], member report sshe is also  trying to sale her house. Member reports she talk therapist arranged by [facility]. RNCM discussed social work referral. Member Declines any additional interventions at this time.  History of heart failure-member has the heart failure zone tool on her refrigerator and weighs self daily. Staff at [employer] and ensures she weighs daily. No weight increase greater than 3 pounds overnight. Denies shortness of breath.  History of COPD-member reports she would like education/reinforcement of COPD management.  Member has a follow up appointment  today with primary care.   Plan: continue weekly engagements, education reinforcement COPD.  Thea Silversmith, RN, MSN, [place] Coordinator Cell: [phone]

## 2016-06-05 NOTE — Assessment & Plan Note (Signed)
stable overall by history and exam, recent data reviewed with pt, and pt to continue medical treatment as before,  to f/u any worsening symptoms or concerns BP Readings from Last 3 Encounters:  06/05/16 118/62  06/05/16 112/60  05/23/16 95/61

## 2016-06-05 NOTE — Assessment & Plan Note (Addendum)
Clinically improved, to finish augmentina asd,  to f/u any worsening symptoms or concerns  Note:  Total time for pt hx, exam, review of record with pt in the room, determination of diagnoses and plan for further eval and tx is > 40 min, with over 50% spent in coordination and counseling of patient

## 2016-06-05 NOTE — Assessment & Plan Note (Signed)
For f/u with Dr Earlie Server in Oct 2017 as planned with CT

## 2016-06-10 ENCOUNTER — Ambulatory Visit: Payer: Self-pay

## 2016-06-12 ENCOUNTER — Ambulatory Visit: Payer: Self-pay

## 2016-06-12 ENCOUNTER — Other Ambulatory Visit: Payer: Self-pay

## 2016-06-12 NOTE — Patient Outreach (Signed)
[facility]) Care Management  06/12/2016  IDESSA NIRO 03/14/1946 [id number]   70 year old with recent admission for pneumonia. History of heart failure. Transition of care call. Member reports she is doing well. Denies shortness of breath at this time. Denies any issues at this time  Discussed purpose of rescue inhaler. Member reports she needs a refill and states the facility is getting a refill.  This morning I feel like [place] am [place]. Reports green zone for both copd and heart failure zone. RNCM discussed yellow zone signs/symptoms for COPD and heart failure.  Plan: continue weekly transition of care calls.  Thea Silversmith, RN, MSN, [place] Coordinator Cell: [phone]

## 2016-06-16 ENCOUNTER — Other Ambulatory Visit: Payer: Self-pay | Admitting: Emergency Medicine

## 2016-06-16 ENCOUNTER — Other Ambulatory Visit: Payer: Self-pay | Admitting: Licensed Clinical Social Worker

## 2016-06-16 ENCOUNTER — Other Ambulatory Visit: Payer: Self-pay

## 2016-06-16 NOTE — Patient Outreach (Signed)
[place]) Care Management  06/16/2016  Jasmine [name] [birth year] [id number]   Assessment- CSW received new referral for patient in order to provide mental health resources. Patient is in the process of selling her house and has moved to [facility] independent living [facility]. CSW completed initial outreach on 06/16/16. Patient answered. HIPPA verifications. CSW introduced self, reason for call and of [facility] social work services. Patient reports that she is interested in gaining community resources. She states "I just want someone to talk to. I want to run things over with them and get advice. It's been a little overwhelming for me." Patient reports that she already has a therapist that was referred to her by [place]. Patient states that this therapist comes to her residence and completes a session. She shares that she has not scheduled another appointment. CSW reviewed available mental health resources in [place] including [place], [place], [place], etc. Patient was encouraged to contact her therapist to schedule another appointment. Patient reports that she has the number around her residence but that if she cannot find it that the front desk has this contact number. Patient is agreeable to this CSW mailing mental health resources to her residence. Patient does not have a psychiatrist but her PCP prescribes her antidepressants. Patient reports "I think my medications are working. I'm not really depressed. I just like to talking to someone." CSW suggested that she continue to discuss her depressive symptoms with her PCP if they arise. Patient has lost a spouse. CSW discussed grief resources with patient. CSW discussed grief support groups for the loss of a spouse and grief individual therapy free of charge at [facility]. CSW will mail out grief resources as well. Patient is also interested in [place] benefit resources. CSW educated her on the [place]. CSW  will mail out [place] resources as well. Patient reports that she completed an advance directive but has not had it notarized. CSW will mail a new advance directives for her to complete if needed. CSW educated her on ways to get document notarized and how to complete. Patient denies needing assistance with completing document. Patient denies needing financial, food, housing, or transportation resources. CSW will also mail out information on [place] Legal [employer]. CSW will mail out all community resource information to patient. Patient appreciative of resource information.   Plan-CSW will not open case. Social work need met. CSW will mail out all resource information to patient within one week. CSW will update RNCM.  [name], BSW, MSW, [email or web address] Phone: [phone] Fax: [phone]

## 2016-06-16 NOTE — Telephone Encounter (Signed)
Called pt inform her yes she need to still use if she need to. Pt now lives at [facility] & she is not sure which pharmacy they used. She states she will ask administration and give me ca call back with correct pharmacy...Johny Chess

## 2016-06-16 NOTE — Telephone Encounter (Signed)
Pt called and wanted to know if she still needs to use her rescue inhaler albuterol (PROVENTIL HFA;VENTOLIN HFA) 108 (90 Base) MCG/ACT inhaler. If so she is going to needs a prescription refill on it. Pharmacy would need to go to [facility] so they can fill it for her. Please advise thanks.

## 2016-06-16 NOTE — Patient Outreach (Signed)
[facility]) Care Management  06/16/2016  Jasmine [name] 22-Feb-1946 [id number]   Assessment: 70 year old with history of heart failure. Recent admission due to pneumonia. Member with a history of asthma. RNCM called for Follow up transition of care.   Member reports signs/symptoms of depression. Discussing the recent loss of her husband. Member reports feeling overwhelmed with all that needs to be done. Member reports she is trying to get the clutter organized. She expressed feelings related to the loss of her husband. Discussed social work referral for signs/symptoms of depression resources. Mrs. Jasmine [name] states she has a talk therapist provided by the facility, but states she has not seen her in a month and is not sure when she will see her again. [name] is agreeable to social work referral to discuss resources.  Mrs. Jasmine [name] reports she has called primary care this morning for a refill on medication. Discussed making a list each day of the most important things to get done that day and rewarding self at the end of the day with something she enjoys.   Reviewed: Yellow zone signs/symptoms, 3-2-1 action plan.  Plan: continue to follow, home visit   Jasmine Silversmith, RN, MSN, [place] Coordinator Cell: [phone]

## 2016-06-17 ENCOUNTER — Other Ambulatory Visit: Payer: Self-pay | Admitting: Licensed Clinical Social Worker

## 2016-06-17 MED ORDER — ALBUTEROL SULFATE HFA 108 (90 BASE) MCG/ACT IN AERS: 2.0000 | INHALATION_SPRAY | Freq: Four times a day (QID) | RESPIRATORY_TRACT | 3 refills | Status: DC | PRN

## 2016-06-17 NOTE — Telephone Encounter (Signed)
Called [name] to see which pharmacy they use. Spoke with [name] she states the facility uses [facility]. Will send rx for pt inhaler to [facility]...Jasmine [name]

## 2016-06-17 NOTE — Patient Outreach (Signed)
[facility]) Care Management  06/17/2016  Jasmine [name] 08-10-1946 [id number]   Assessment- CSW successfully mailed out all requested resources to patient's residence today. [facility] RNCM requested that CSW include credit consumer counseling information in this packet. Information included.   Plan-CSW will not open case. All social work needs met.  [name], BSW, MSW, [email or web address] Phone: [phone] Fax: [phone]

## 2016-06-17 NOTE — Addendum Note (Signed)
Addended by: Earnstine Regal on: 06/17/2016 08:59 AM   Modules accepted: Orders

## 2016-06-20 ENCOUNTER — Other Ambulatory Visit: Payer: Self-pay | Admitting: Internal Medicine

## 2016-06-20 MED ORDER — ALPRAZOLAM 0.25 MG PO TABS: 0.2500 mg | ORAL_TABLET | Freq: Two times a day (BID) | ORAL | 2 refills | Status: DC | PRN

## 2016-06-20 NOTE — Telephone Encounter (Signed)
Done hardcopy to [name]  

## 2016-06-25 DIAGNOSIS — J449 Chronic obstructive pulmonary disease, unspecified: Secondary | ICD-10-CM | POA: Diagnosis not present

## 2016-06-26 ENCOUNTER — Other Ambulatory Visit: Payer: Self-pay

## 2016-06-26 NOTE — Patient Outreach (Signed)
[facility]) Care Management   06/26/2016  Jasmine [name] 1946-05-31 [id number]  Jasmine [name] is an 70 y.o. female  Subjective: member reports "I feel fine really".  Objective:   Review of Systems  Respiratory: Negative for cough, sputum production and shortness of breath.   Cardiovascular: Negative for chest pain, palpitations and leg swelling.  Neurological: Negative for dizziness.    Physical Exam  Encounter Medications:   Outpatient Encounter Prescriptions as of 06/26/2016  Medication Sig  . albuterol (PROVENTIL HFA;VENTOLIN HFA) 108 (90 Base) MCG/ACT inhaler Inhale 2 puffs into the lungs every 6 (six) hours as needed for wheezing or shortness of breath.  [name] albuterol (PROVENTIL) (2.5 MG/3ML) 0.083% nebulizer solution Take 3 mLs (2.5 mg total) by nebulization every 4 (four) hours as needed for wheezing.  [name] ALPRAZolam (XANAX) 0.25 MG tablet Take 1 tablet (0.25 mg total) by mouth 2 (two) times daily as needed for anxiety.  [name] aspirin EC 81 MG EC tablet Take 1 tablet (81 mg total) by mouth daily.  . citalopram (CELEXA) 20 MG tablet Take 1 tablet (20 mg total) by mouth daily.  . feeding supplement, ENSURE ENLIVE, (ENSURE ENLIVE) LIQD Take 237 mLs by mouth 3 (three) times daily between meals.  . fluticasone furoate-vilanterol (BREO ELLIPTA) 200-25 MCG/INH AEPB Inhale 1 puff into the lungs daily.  . furosemide (LASIX) 40 MG tablet Take 1 tablet (40 mg total) by mouth 2 (two) times daily.  [name] levothyroxine (SYNTHROID, LEVOTHROID) 75 MCG tablet Take 1 tablet (75 mcg total) by mouth daily before breakfast.  . OXYGEN Inhale 4 L into the lungs continuous.   . potassium chloride (K-DUR,KLOR-CON) 10 MEQ tablet Take 10 mEq by mouth 2 (two) times daily.  [name] tiotropium (SPIRIVA HANDIHALER) 18 MCG inhalation capsule Place 1 capsule (18 mcg total) into inhaler and inhale daily.   No facility-administered encounter medications on file as of 06/26/2016.     Functional Status:   In  your present state of health, do you have any difficulty performing the following activities: 06/26/2016 06/05/2016  Hearing? - Y  Vision? - N  Difficulty concentrating or making decisions? - N  Walking or climbing stairs? - N  Dressing or bathing? - N  Doing errands, shopping? - N  Conservation officer, nature and eating ? Y N  Using the Toilet? N N  In the past six months, have you accidently leaked urine? N N  Do you have problems with loss of bowel control? N N  Managing your Medications? N N  Managing your Finances? N N  Housekeeping or managing your Housekeeping? N N  Some recent data might be hidden    Fall/Depression Screening:    PHQ 2/9 Scores 06/26/2016 06/16/2016 06/05/2016 04/01/2016 03/11/2016 02/13/2016 02/11/2016  PHQ - 2 Score 1 1 1 1 1 1  0  PHQ- 9 Score - - - - - - -   Fall Risk  06/26/2016 06/16/2016 06/05/2016 05/13/2016 04/01/2016  Falls in the past year? No No No No No  Risk for fall due to : - - - Impaired balance/gait Impaired balance/gait    Assessment:  70 year old with recent admission for  Pneumonia. Member with history of COPD. Member reports she is doing well. Member is in good spirits today. Denies any shortness of breath. On oxygen at 2 liters/nasal cannula continuous.  Member continues to live at assisted living facility where her medications are managed by staff.  Blood pressure a little 100/60. Member reports she just  took her medications approximately one hour prior to care coordinators arrival. [facility] notified staff and request follow up of blood pressure. Member denies any dizziness. No complaints voiced.  No additional care coordination needs noted.  RNCM discussed continued education on COPD. Member declines, but request reinforcement/education on heart failure from health coach staff.  Plan: update primary care/Referral to health coach  Thea Silversmith, RN, MSN, [place] Coordinator Cell: [phone]

## 2016-07-01 ENCOUNTER — Other Ambulatory Visit: Payer: Self-pay

## 2016-07-01 NOTE — Telephone Encounter (Signed)
This encounter was created in error - please disregard.

## 2016-07-01 NOTE — Patient Outreach (Signed)
[facility]) Care Management  07/01/2016  AQUARIUS STOKLEY 02/20/1946 [id number]   Telephone call to patient for introductory call. Patient reports she is doing good.  Explained health coach and role.  Patient receptive to call.   Plan: RN Health Coach will contact patient in the month of October and patient agrees to next outreach.  [name], RN, MSN [place] [phone]

## 2016-07-01 NOTE — Patient Outreach (Addendum)
[facility]) Care Management  06/30/16  JESLY VANDERJAGT 04-18-1946 [id number]   Care Coordination- member's blood pressure was a little low at last home visit 100/60- member had just taken her medications within the hour. RNCM followed up with member regarding blood pressure recheck.   Member reports the staff rechecked her blood pressure sysolic was [id number]. Member does not remember the bottom number. Member denies any issues at this time.   RNCM discussed having her own blood pressure cuff for self-monitoring. Member declined stating that she would have the staff at [facility] check her blood pressure periodically.  Member is aware that the health coach will be calling her for disease management.   Plan: health coach  Thea Silversmith, RN, MSN, [place] Coordinator Cell: [phone]

## 2016-07-01 NOTE — Addendum Note (Signed)
Addended by: Luretha Rued on: 07/01/2016 11:13 AM   Modules accepted: Miquel Dunn

## 2016-07-10 ENCOUNTER — Ambulatory Visit (INDEPENDENT_AMBULATORY_CARE_PROVIDER_SITE_OTHER): Payer: Commercial Managed Care - HMO | Admitting: Internal Medicine

## 2016-07-10 VITALS — BP 114/70 | HR 73 | Temp 98.4°F | Resp 16 | Wt 116.0 lb

## 2016-07-10 DIAGNOSIS — I10 Essential (primary) hypertension: Secondary | ICD-10-CM | POA: Diagnosis not present

## 2016-07-10 DIAGNOSIS — R21 Rash and other nonspecific skin eruption: Secondary | ICD-10-CM | POA: Diagnosis not present

## 2016-07-10 DIAGNOSIS — Z23 Encounter for immunization: Secondary | ICD-10-CM

## 2016-07-10 DIAGNOSIS — W57XXXA Bitten or stung by nonvenomous insect and other nonvenomous arthropods, initial encounter: Secondary | ICD-10-CM | POA: Diagnosis not present

## 2016-07-10 DIAGNOSIS — T148 Other injury of unspecified body region: Secondary | ICD-10-CM | POA: Diagnosis not present

## 2016-07-10 MED ORDER — TRIAMCINOLONE ACETONIDE 0.1 % EX CREA: 1.0000 "application " | TOPICAL_CREAM | Freq: Two times a day (BID) | CUTANEOUS | 0 refills | Status: DC | PRN

## 2016-07-10 MED ORDER — METHYLPREDNISOLONE ACETATE 80 MG/ML IJ SUSP
80.0000 mg | Freq: Once | INTRAMUSCULAR | Status: AC
  Administered 2016-07-10: 80 mg via INTRAMUSCULAR

## 2016-07-10 MED ORDER — DIPHENHYDRAMINE-ZINC ACETATE 2-0.1 % EX CREA: 1.0000 "application " | TOPICAL_CREAM | Freq: Three times a day (TID) | CUTANEOUS | 0 refills | Status: DC | PRN

## 2016-07-10 NOTE — Patient Instructions (Addendum)
You had the flu shot today  You had the steroid shot today  Please take all new medication as prescribed  - the steroid cream, and the benadryl cream as needed  Please continue all other medications as before, and refills have been done if requested.  Please have the pharmacy call with any other refills you may need.  Please keep your appointments with your specialists as you may have planned

## 2016-07-10 NOTE — Progress Notes (Signed)
Subjective:    Patient ID: SMT STIDAM, female    DOB: [birth year], 70 y.o.   MRN: [id number]  HPI   Here with an area of scaly silvery subtle rash to all extremities, with much itching and scratching, but no excoriations, worsening redness, swelling, pain, fever or drainage.  Does has a scabbed now ? Insect bite site to right mid back just lateral to spine with some redness,swelling only.  May be better in the last day or so. Does itch somewhat, has tried to get at it with a back scratcher.  Pt denies chest pain, increased sob or doe, wheezing, orthopnea, PND, increased LE swelling, palpitations, dizziness or syncope.  Pt denies new neurological symptoms such as new headache, or facial or extremity weakness or numbness   Pt denies polydipsia, polyuria  Now Living at [facility], really likes it. Past Medical History:  Diagnosis Date  . ALLERGIC RHINITIS 05/01/2007  . Anxiety   . Arthritis    "hands" (12/28/2015)  . ASTHMA 05/01/2007   "since I was a child"  . Cancer (HCC)    LUNG  . CHF (congestive heart failure) ([place])   . Chronic bronchitis ([place])   . COPD (chronic obstructive pulmonary disease) ([place])   . DEPRESSION 07/21/2008  . DVT (deep venous thrombosis) (HCC) 1960s   LLE  . FATIGUE 10/10/2010  . HYPERTENSION 05/01/2007  . HYPERTHYROIDISM 11/23/2007   Pt endorses having had Graves disease, possibly radioactive iodine x1, but no thyroidectomy  . HYPOTHYROIDISM 08/23/2009  . LUMBAR RADICULOPATHY, RIGHT 08/25/2008  . On home oxygen therapy    "3-4L; qd; all the time" (12/28/2015)  . OSTEOPOROSIS 05/01/2007  . SHOULDER PAIN, LEFT 08/23/2009  . SINUSITIS, CHRONIC 10/10/2010   Past Surgical History:  Procedure Laterality Date  . APPENDECTOMY    . DILATION AND CURETTAGE OF UTERUS  multiple   history of multiple dialations and curettages and miscarriages, unfortunately never carrying a child to term  . ECTOPIC PREGNANCY SURGERY  "late '60s or early '70s  . ELECTROCARDIOGRAM  06/20/2006  .  OOPHORECTOMY Right     reports that she quit smoking about a year ago. Her smoking use included Cigarettes. She has a 54.00 pack-year smoking history. She has never used smokeless tobacco. She reports that she drinks alcohol. She reports that she does not use drugs. family history includes Alcohol abuse in her brother; Diabetes in her brother; Hypertension in her other; Lung cancer in her father; Stroke in her mother; Thyroid disease in her mother. Allergies  Allergen Reactions  . Alendronate Sodium Other (See Comments)    Pt does not remember this reaction   Current Outpatient Prescriptions on File Prior to Visit  Medication Sig Dispense Refill  . albuterol (PROVENTIL HFA;VENTOLIN HFA) 108 (90 Base) MCG/ACT inhaler Inhale 2 puffs into the lungs every 6 (six) hours as needed for wheezing or shortness of breath. 1 Inhaler 3  . albuterol (PROVENTIL) (2.5 MG/3ML) 0.083% nebulizer solution Take 3 mLs (2.5 mg total) by nebulization every 4 (four) hours as needed for wheezing. 75 mL 12  . ALPRAZolam (XANAX) 0.25 MG tablet Take 1 tablet (0.25 mg total) by mouth 2 (two) times daily as needed for anxiety. 60 tablet 2  . aspirin EC 81 MG EC tablet Take 1 tablet (81 mg total) by mouth daily. 30 tablet 0  . citalopram (CELEXA) 20 MG tablet Take 1 tablet (20 mg total) by mouth daily. 90 tablet 3  . feeding supplement, ENSURE ENLIVE, (ENSURE ENLIVE) LIQD  Take 237 mLs by mouth 3 (three) times daily between meals. 237 mL 12  . fluticasone furoate-vilanterol (BREO ELLIPTA) 200-25 MCG/INH AEPB Inhale 1 puff into the lungs daily. 1 each 5  . furosemide (LASIX) 40 MG tablet Take 1 tablet (40 mg total) by mouth 2 (two) times daily. 30 tablet 3  . levothyroxine (SYNTHROID, LEVOTHROID) 75 MCG tablet Take 1 tablet (75 mcg total) by mouth daily before breakfast. 30 tablet 0  . OXYGEN Inhale 4 L into the lungs continuous.     . potassium chloride (K-DUR,KLOR-CON) 10 MEQ tablet Take 10 mEq by mouth 2 (two) times daily.      [name] tiotropium (SPIRIVA HANDIHALER) 18 MCG inhalation capsule Place 1 capsule (18 mcg total) into inhaler and inhale daily. 30 capsule 2   No current facility-administered medications on file prior to visit.    Review of Systems  Constitutional: Negative for unusual diaphoresis or night sweats HENT: Negative for ear swelling or discharge Eyes: Negative for worsening visual haziness  Respiratory: Negative for choking and stridor.   Gastrointestinal: Negative for distension or worsening eructation Genitourinary: Negative for retention or change in urine volume.  Musculoskeletal: Negative for other MSK pain or swelling Skin: Negative for color change and worsening wound Neurological: Negative for tremors and numbness other than noted  Psychiatric/Behavioral: Negative for decreased concentration or agitation other than above       Objective:   Physical Exam BP 114/70   Pulse 73   Temp 98.4 F (36.9 C) (Oral)   Resp 16   Wt 116 lb (52.6 kg)   SpO2 (!) 84%   BMI 22.65 kg/m  VS noted,  Constitutional: Pt appears in no apparent distress HENT: Head: NCAT.  Right Ear: External ear normal.  Left Ear: External ear normal.  Eyes: . Pupils are equal, round, and reactive to light. Conjunctivae and EOM are normal Neck: Normal range of motion. Neck supple.  Cardiovascular: Normal rate and regular rhythm.   Pulmonary/Chest: Effort normal and breath sounds without rales or wheezing.  Abd:  Soft, NT, ND, + BS Neurological: Pt is alert. Not confused , motor grossly intact Skin: Skin is warm. Has silvery scaly rash to large areas of both anterior arms and legs, no LE edema, also with ? Bite site right mid back 1 cm erythema nontender with central small scab without fluctuance or drainage Psychiatric: Pt behavior is normal. No agitation.     Assessment & Plan:

## 2016-07-10 NOTE — Progress Notes (Signed)
Pre visit review using our clinic review tool, if applicable. No additional management support is needed unless otherwise documented below in the visit note. 

## 2016-07-12 DIAGNOSIS — W57XXXA Bitten or stung by nonvenomous insect and other nonvenomous arthropods, initial encounter: Secondary | ICD-10-CM | POA: Insufficient documentation

## 2016-07-12 DIAGNOSIS — R21 Rash and other nonspecific skin eruption: Secondary | ICD-10-CM | POA: Insufficient documentation

## 2016-07-12 NOTE — Assessment & Plan Note (Signed)
stable overall by history and exam, recent data reviewed with pt, and pt to continue medical treatment as before,  to f/u any worsening symptoms or concerns BP Readings from Last 3 Encounters:  07/10/16 114/70  06/05/16 118/62  06/05/16 112/60

## 2016-07-12 NOTE — Assessment & Plan Note (Signed)
Likely insect related, no evidence for infection, for benadryl cream prn,  to f/u any worsening symptoms or concerns

## 2016-07-12 NOTE — Assessment & Plan Note (Signed)
Rather large areas if arms and legs c/w pro eczematous type rash,  For depomedrol IM, topical steroid cream, to f/u any worsening symptoms or concerns

## 2016-07-14 DIAGNOSIS — F321 Major depressive disorder, single episode, moderate: Secondary | ICD-10-CM | POA: Diagnosis not present

## 2016-07-14 DIAGNOSIS — F419 Anxiety disorder, unspecified: Secondary | ICD-10-CM | POA: Diagnosis not present

## 2016-07-17 ENCOUNTER — Telehealth: Payer: Self-pay | Admitting: Pulmonary Disease

## 2016-07-17 NOTE — Telephone Encounter (Addendum)
Spoke with pt. She reports she saw a commercial on TV for POC inogen. It weighs less than 2 lbs. She is wanting to know what Dr. Elsworth Soho thinks about this.  Please advise RA thanks

## 2016-07-17 NOTE — Telephone Encounter (Signed)
She needs 3 L continuous-so I doubt she would qualify for a portable concentrator Have DME evaluate She also needs office visit with TP/me

## 2016-07-18 NOTE — Telephone Encounter (Signed)
Called spoke with pt. Reviewed results and recs. She states that she is going to call and speak with [employer] and see if it will be covered. She states she will call back when she is ready for the order to be placed. She voiced understanding and had no further questions. Will await call back.

## 2016-07-21 ENCOUNTER — Other Ambulatory Visit: Payer: Self-pay | Admitting: Licensed Clinical Social Worker

## 2016-07-21 DIAGNOSIS — F419 Anxiety disorder, unspecified: Secondary | ICD-10-CM | POA: Diagnosis not present

## 2016-07-21 DIAGNOSIS — F321 Major depressive disorder, single episode, moderate: Secondary | ICD-10-CM | POA: Diagnosis not present

## 2016-07-21 NOTE — Patient Outreach (Addendum)
[facility]) Care Management  07/21/2016  Jasmine [name] 1946-04-16 [id number]   Assessment- CSW completed call to patient to ensure that she received mailed resources successfully before discharging patient. CSW unable to reach her. HIPPA compliant voice message left.  Plan-CSW will no re open case.  [name], BSW, MSW, [email or web address] Phone: [phone] Fax: [phone]

## 2016-07-25 DIAGNOSIS — J449 Chronic obstructive pulmonary disease, unspecified: Secondary | ICD-10-CM | POA: Diagnosis not present

## 2016-07-29 ENCOUNTER — Other Ambulatory Visit: Payer: Self-pay | Admitting: Licensed Clinical Social Worker

## 2016-07-29 ENCOUNTER — Other Ambulatory Visit: Payer: Self-pay

## 2016-07-29 ENCOUNTER — Encounter: Payer: Self-pay | Admitting: Licensed Clinical Social Worker

## 2016-07-29 NOTE — Patient Outreach (Signed)
[place] [facility]) Care Management  [place]  07/29/2016   Jasmine [name] September 03, 1946 [id number]  Subjective: Telephone call to patient for initial assessment.  Patient reports she is doing ok. She reports that her breathing is ok but having some issues with allergies.  Patient using inhalers as ordered and tries to remain as active as she can. Patient shares that she is thinking of moving back to her home but knows she needs some assistance with daily needs.  Discussed with patient having social worker contact her to help with getting resources lined for her possible return home.  Patient is agreeable to that.  Will do social work referral. Patient continues to weigh daily and watch her sodium intake.  Patient weight today was 113.4 lbs.  Dicussed with patient heart failure zones and when to notify physician.   She verbalized understanding.   Objective:   Encounter Medications:  Outpatient Encounter Prescriptions as of 07/29/2016  Medication Sig  . albuterol (PROVENTIL HFA;VENTOLIN HFA) 108 (90 Base) MCG/ACT inhaler Inhale 2 puffs into the lungs every 6 (six) hours as needed for wheezing or shortness of breath.  [name] albuterol (PROVENTIL) (2.5 MG/3ML) 0.083% nebulizer solution Take 3 mLs (2.5 mg total) by nebulization every 4 (four) hours as needed for wheezing.  [name] ALPRAZolam (XANAX) 0.25 MG tablet Take 1 tablet (0.25 mg total) by mouth 2 (two) times daily as needed for anxiety.  [name] aspirin EC 81 MG EC tablet Take 1 tablet (81 mg total) by mouth daily.  . citalopram (CELEXA) 20 MG tablet Take 1 tablet (20 mg total) by mouth daily.  . diphenhydrAMINE-zinc acetate (BENADRYL EXTRA STRENGTH) cream Apply 1 application topically 3 (three) times daily as needed for itching.  . feeding supplement, ENSURE ENLIVE, (ENSURE ENLIVE) LIQD Take 237 mLs by mouth 3 (three) times daily between meals.  . fluticasone furoate-vilanterol (BREO ELLIPTA) 200-25 MCG/INH AEPB Inhale 1 puff into the  lungs daily.  . furosemide (LASIX) 40 MG tablet Take 1 tablet (40 mg total) by mouth 2 (two) times daily.  [name] levothyroxine (SYNTHROID, LEVOTHROID) 75 MCG tablet Take 1 tablet (75 mcg total) by mouth daily before breakfast.  . OXYGEN Inhale 4 L into the lungs continuous.   . potassium chloride (K-DUR,KLOR-CON) 10 MEQ tablet Take 10 mEq by mouth 2 (two) times daily.  [name] tiotropium (SPIRIVA HANDIHALER) 18 MCG inhalation capsule Place 1 capsule (18 mcg total) into inhaler and inhale daily.  [name] triamcinolone cream (KENALOG) 0.1 % Apply 1 application topically 2 (two) times daily as needed.   No facility-administered encounter medications on file as of 07/29/2016.     Functional Status:  In your present state of health, do you have any difficulty performing the following activities: 07/29/2016 06/26/2016  Hearing? [place]? N -  Difficulty concentrating or making decisions? N -  Walking or climbing stairs? Y -  Dressing or bathing? N -  Doing errands, shopping? N -  Preparing Food and eating ? N Y  Using the Toilet? - N  In the past six months, have you accidently leaked urine? N N  Do you have problems with loss of bowel control? N N  Managing your Medications? N N  Managing your Finances? N N  Housekeeping or managing your Housekeeping? Y N  Some recent data might be hidden    Fall/Depression Screening: PHQ 2/9 Scores 07/29/2016 06/26/2016 06/16/2016 06/05/2016 04/01/2016 03/11/2016 02/13/2016  PHQ - 2 Score 0 1 1 1 1 1  1  PHQ- 9 Score - - - - - - -    Assessment: Patient will benefit from health coach outreach for disease management.     Plan:  [facility] CM Care Plan Problem One   Flowsheet Row Most Recent Value  Care Plan Problem One  Knowledge Deficit Heart Failure  Role Documenting the Problem One  [place] for Problem One  Active  THN Long Term Goal (31-90 days)  Patient will be able to verbalize heart failure zones within the next 90 days.    THN Long Term Goal Start  Date  07/29/16  Interventions for Problem One Long Term Goal  RN Health Coach discussed heart failure zones with patient and when to notify physician.       RN Health Coach will provide ongoing education for patient on heart failure through phone calls and sending printed information to patient for further discussion.  RN Health Coach sent welcome letter.  RN Health Coach will send initial barriers letter, assessment, and care plan to primary care physician.  RN Health Coach will contact patient in the month of October and patient agrees to next contact.   [name], RN, MSN [place] [phone]

## 2016-07-29 NOTE — Patient Outreach (Signed)
[place] [facility]) Care Management  07/29/2016  Jasmine [name] 1946/05/01 [id number]   Assessment- CSW received new referral on patient. Last social work contact was on 06/17/16. RNCM states that patient wishes to move from [employer] back to her home but will need someone to assist her with her daily needs. CSW completed call to patient and patient answered. HIPPA verifications provided. Patient does not remember speaking to CSW in August of this year. CSW introduced self and reason for call. Patient reports that she did receive mental health resources, grief resources, [place] specialist contact information and advance directives. CSW spent time re educating patient on all of these resources. Patient reports that she is not experiencing any depressive symptoms at this time and has completed advance directives already but needs to have document signed. However, patient has not made any progress towards gaining mental health therapy or contacting [place] specialist. Patient is agreeable to keeping resource information so that she can have information when she needs it.   Patient reports that she wishes to gain personal care services if she moves back home but that she would only need the assistance for light cleaning and preparing meals. CSW educated her on private pay personal care resources. Patient reports that her monthly income is between $2,500 - 3,000 per month. Patient will not qualify for Medicaid. Patient was educated on [facility] program but was reminded that she would have to pay the full cost of CNA services due to her income. CSW successfully put patient on [facility] wait list which is 8 month to a year. CSW provided education on program. Patient is agreeable to home visit tomorrow so that CSW can provide resource education on private pay personal care resources, [facility], and [employer].  Plan-CSW sent involvement letter to PCP. CSW will complete home visit tomorrow.  [facility] CM Care Plan  Problem One   Flowsheet Row Most Recent Value  Care Plan Problem One  Need for community resource education  Role Documenting the Problem One  Clinical Social Worker  Care Plan for Problem One  Active  THN CM Short Term Goal #1 (0-30 days)  Patient will gain personal care resource information within 30 days as evidenced by her desire to move from senior community back to home  [facility] CM Short Term Goal #1 Start Date  07/29/16  Interventions for Short Term Goal #1  CSW will complete home visit, provide education on resources, provide handouts on resources and re educate her when needed within 30 days       [name], [place], MSW, [email or web address] Phone: [phone] Fax: [phone]

## 2016-07-30 ENCOUNTER — Other Ambulatory Visit: Payer: Self-pay | Admitting: Licensed Clinical Social Worker

## 2016-07-30 NOTE — Patient Outreach (Addendum)
[name]) Care Management  [facility] Social Work  07/30/2016  Jasmine [name] 03/19/1946 [id number]  Encounter Medications:  Outpatient Encounter Prescriptions as of 07/30/2016  Medication Sig  . albuterol (PROVENTIL HFA;VENTOLIN HFA) 108 (90 Base) MCG/ACT inhaler Inhale 2 puffs into the lungs every 6 (six) hours as needed for wheezing or shortness of breath.  [name] albuterol (PROVENTIL) (2.5 MG/3ML) 0.083% nebulizer solution Take 3 mLs (2.5 mg total) by nebulization every 4 (four) hours as needed for wheezing.  [name] ALPRAZolam (XANAX) 0.25 MG tablet Take 1 tablet (0.25 mg total) by mouth 2 (two) times daily as needed for anxiety.  [name] aspirin EC 81 MG EC tablet Take 1 tablet (81 mg total) by mouth daily.  . citalopram (CELEXA) 20 MG tablet Take 1 tablet (20 mg total) by mouth daily.  . diphenhydrAMINE-zinc acetate (BENADRYL EXTRA STRENGTH) cream Apply 1 application topically 3 (three) times daily as needed for itching.  . feeding supplement, ENSURE ENLIVE, (ENSURE ENLIVE) LIQD Take 237 mLs by mouth 3 (three) times daily between meals.  . fluticasone furoate-vilanterol (BREO ELLIPTA) 200-25 MCG/INH AEPB Inhale 1 puff into the lungs daily.  . furosemide (LASIX) 40 MG tablet Take 1 tablet (40 mg total) by mouth 2 (two) times daily.  [name] levothyroxine (SYNTHROID, LEVOTHROID) 75 MCG tablet Take 1 tablet (75 mcg total) by mouth daily before breakfast.  . OXYGEN Inhale 4 L into the lungs continuous.   . potassium chloride (K-DUR,KLOR-CON) 10 MEQ tablet Take 10 mEq by mouth 2 (two) times daily.  [name] tiotropium (SPIRIVA HANDIHALER) 18 MCG inhalation capsule Place 1 capsule (18 mcg total) into inhaler and inhale daily.  [name] triamcinolone cream (KENALOG) 0.1 % Apply 1 application topically 2 (two) times daily as needed.   No facility-administered encounter medications on file as of 07/30/2016.     Functional Status:  In your present state of health, do you have any difficulty performing the  following activities: 07/29/2016 06/26/2016  Hearing? [place]? N -  Difficulty concentrating or making decisions? N -  Walking or climbing stairs? Y -  Dressing or bathing? N -  Doing errands, shopping? N -  Preparing Food and eating ? N Y  Using the Toilet? - N  In the past six months, have you accidently leaked urine? N N  Do you have problems with loss of bowel control? N N  Managing your Medications? N N  Managing your Finances? N N  Housekeeping or managing your Housekeeping? Y N  Some recent data might be hidden    Fall/Depression Screening:  PHQ 2/9 Scores 07/29/2016 07/29/2016 06/26/2016 06/16/2016 06/05/2016 04/01/2016 03/11/2016  PHQ - 2 Score 0 0 1 1 1 1 1   PHQ- 9 Score - - - - - - -    Assessment: CSW completed home visit on 07/30/16. Patient is considering relocating from [facility] back to her residence which she still owns due to the increased price of where she is currently staying. However, she reports that [name] is working with patient to find a different room with a more affordable price. Patient reports that if she moves back to her home then she will need assistance with light cleaning and someone to assist with preparing meals. CSW provided an entire packet of resources stapled together which include: List of personal care resources, In [place], [place], [place] specialist information, List of cleaning agencies, [employer] information and [place] Legal Aide information. CSW spent time completing review of all  resources. Patient was successfully put on wait list for [facility] but due to her income, she would have to pay the full price of $20.00 per hour. [facility] has a wait list up to one year so it would be better for patient to find a different agency as that is the average price for aides in [place], [place]. [place] assisted patient with developing a pros and cons list to aide in her decision making. Patient reports that she use to use THC very regularly and had  difficulty quitting. She reports that she has stopped using both cigarettes and THC by relocating and implementing spirituality into her life. Patient admits that she drinks "1 beer every now and then." She denies needing substance abuse resources. Patient reported yesterday that she had not used mental health resources that were previously provided but states that she sees a talk therapist named Novella Rob every two weeks which has been very helpful to her. She reports that she does not experience any depressive symptoms at this time but likes to "get advice and talk things out with [name]." Patient reports that her talk therapist also assist her with dealing with the loss of her spouse who passed in March of this year. She reports that she is handling the loss well. She states that her support system consist of her step children, nieces and her first husband who she has recently reconnected with. Patient has completed an advance directive but has not had document notarized. Patient was encouraged to do so. CSW provided emotional support throughout entire session.   Plan: CSW will complete outreach to patient within three weeks to ensure that she has all the resources she needs.  [name], BSW, MSW, [email or web address] Phone: [phone] Fax: [phone]

## 2016-08-04 DIAGNOSIS — F419 Anxiety disorder, unspecified: Secondary | ICD-10-CM | POA: Diagnosis not present

## 2016-08-04 DIAGNOSIS — F321 Major depressive disorder, single episode, moderate: Secondary | ICD-10-CM | POA: Diagnosis not present

## 2016-08-06 ENCOUNTER — Ambulatory Visit (HOSPITAL_COMMUNITY)
Admission: RE | Admit: 2016-08-06 | Discharge: 2016-08-06 | Disposition: A | Discharge status: Home / Self Care | Payer: Commercial Managed Care - HMO | Source: Ambulatory Visit | Attending: Internal Medicine | Admitting: Internal Medicine

## 2016-08-06 ENCOUNTER — Other Ambulatory Visit (HOSPITAL_BASED_OUTPATIENT_CLINIC_OR_DEPARTMENT_OTHER): Payer: Commercial Managed Care - HMO

## 2016-08-06 DIAGNOSIS — I700 Atherosclerosis of aorta: Secondary | ICD-10-CM | POA: Diagnosis not present

## 2016-08-06 DIAGNOSIS — R918 Other nonspecific abnormal finding of lung field: Secondary | ICD-10-CM

## 2016-08-06 DIAGNOSIS — J189 Pneumonia, unspecified organism: Secondary | ICD-10-CM | POA: Insufficient documentation

## 2016-08-06 DIAGNOSIS — J439 Emphysema, unspecified: Secondary | ICD-10-CM | POA: Insufficient documentation

## 2016-08-06 DIAGNOSIS — R590 Localized enlarged lymph nodes: Secondary | ICD-10-CM | POA: Insufficient documentation

## 2016-08-06 DIAGNOSIS — I2510 Atherosclerotic heart disease of native coronary artery without angina pectoris: Secondary | ICD-10-CM | POA: Diagnosis not present

## 2016-08-06 DIAGNOSIS — M4856XA Collapsed vertebra, not elsewhere classified, lumbar region, initial encounter for fracture: Secondary | ICD-10-CM | POA: Diagnosis not present

## 2016-08-06 LAB — CBC WITH DIFFERENTIAL/PLATELET
BASO%: 0.7 % (ref 0.0–2.0)
Basophils Absolute: 0 10*3/uL (ref 0.0–0.1)
EOS%: 6.3 % (ref 0.0–7.0)
Eosinophils Absolute: 0.4 10*3/uL (ref 0.0–0.5)
HCT: 44.6 % (ref 34.8–46.6)
HGB: 14.1 g/dL (ref 11.6–15.9)
LYMPH%: 28.5 % (ref 14.0–49.7)
MCH: 29.2 pg (ref 25.1–34.0)
MCHC: 31.6 g/dL (ref 31.5–36.0)
MCV: 92.3 fL (ref 79.5–101.0)
MONO#: 0.5 10*3/uL (ref 0.1–0.9)
MONO%: 7.4 % (ref 0.0–14.0)
NEUT%: 57.1 % (ref 38.4–76.8)
NEUTROS ABS: 3.5 10*3/uL (ref 1.5–6.5)
Platelets: 217 10*3/uL (ref 145–400)
RBC: 4.83 10*6/uL (ref 3.70–5.45)
RDW: 16.3 % — ABNORMAL HIGH (ref 11.2–14.5)
WBC: 6.1 10*3/uL (ref 3.9–10.3)
lymph#: 1.7 10*3/uL (ref 0.9–3.3)

## 2016-08-06 LAB — COMPREHENSIVE METABOLIC PANEL
ALT: 16 U/L (ref 0–55)
ANION GAP: 13 meq/L — AB (ref 3–11)
AST: 27 U/L (ref 5–34)
Albumin: 3.7 g/dL (ref 3.5–5.0)
Alkaline Phosphatase: 77 U/L (ref 40–150)
BILIRUBIN TOTAL: 0.61 mg/dL (ref 0.20–1.20)
BUN: 18.6 mg/dL (ref 7.0–26.0)
CHLORIDE: 94 meq/L — AB (ref 98–109)
CO2: 35 meq/L — AB (ref 22–29)
CREATININE: 0.8 mg/dL (ref 0.6–1.1)
Calcium: 10.1 mg/dL (ref 8.4–10.4)
EGFR: 86 mL/min/{1.73_m2} — ABNORMAL LOW (ref >90)
GLUCOSE: 93 mg/dL (ref 70–140)
Potassium: 3.6 mEq/L (ref 3.5–5.1)
SODIUM: 142 meq/L (ref 136–145)
TOTAL PROTEIN: 8.8 g/dL — AB (ref 6.4–8.3)

## 2016-08-06 MED ORDER — IOPAMIDOL (ISOVUE-300) INJECTION 61%
75.0000 mL | Freq: Once | INTRAVENOUS | Status: AC | PRN
  Administered 2016-08-06: 75 mL via INTRAVENOUS

## 2016-08-11 DIAGNOSIS — F321 Major depressive disorder, single episode, moderate: Secondary | ICD-10-CM | POA: Diagnosis not present

## 2016-08-11 DIAGNOSIS — F419 Anxiety disorder, unspecified: Secondary | ICD-10-CM | POA: Diagnosis not present

## 2016-08-12 ENCOUNTER — Other Ambulatory Visit: Payer: Self-pay | Admitting: Licensed Clinical Social Worker

## 2016-08-12 NOTE — Patient Outreach (Signed)
[facility]) Care Management  08/12/2016  Jasmine [name] 12/07/1945 [id number]  Assessment- CSW completed outreach to patient. She answered. She states that she still has not decided if she will be relocating from [facility] back to her house. She would prefer to stay at [facility] but is not sure if she can continue to afford it. She has met with several staff members at [facility] to see if she can either transition to a cheaper room (which would have a roommate) or to become self medication resident which means she will be responsible for taking her own medications and would not be as expensive. CSW provided emotional support throughout conversation. She reports "I am not going to be stressed about it. I am putting it in God's hands. Either way I will be okay." CSW provided education on all community resources that were discussed during last home visit.  Plan-CSW will follow up within three weeks.  [name], BSW, MSW, [email or web address] Phone: [phone] Fax: [phone]

## 2016-08-13 ENCOUNTER — Encounter: Payer: Self-pay | Admitting: Internal Medicine

## 2016-08-13 ENCOUNTER — Ambulatory Visit (HOSPITAL_BASED_OUTPATIENT_CLINIC_OR_DEPARTMENT_OTHER): Payer: Commercial Managed Care - HMO | Admitting: Internal Medicine

## 2016-08-13 VITALS — BP 113/70 | HR 91 | Temp 97.6°F | Resp 16 | Ht 60.0 in | Wt 116.3 lb

## 2016-08-13 DIAGNOSIS — R918 Other nonspecific abnormal finding of lung field: Secondary | ICD-10-CM

## 2016-08-13 DIAGNOSIS — J189 Pneumonia, unspecified organism: Secondary | ICD-10-CM

## 2016-08-13 DIAGNOSIS — J9611 Chronic respiratory failure with hypoxia: Secondary | ICD-10-CM

## 2016-08-13 DIAGNOSIS — R911 Solitary pulmonary nodule: Secondary | ICD-10-CM

## 2016-08-13 NOTE — Progress Notes (Signed)
[place] [phone]   [phone]  OFFICE PROGRESS NOTE  [name], MD [place] [address]  DIAGNOSIS: Bilateral pulmonary nodule likely inflammatory in origin.  PRIOR THERAPY: None  CURRENT THERAPY: None.  INTERVAL HISTORY: Jasmine [name] 70 y.o. female returns to the clinic today for follow-up visit. The patient is feeling fine today with no specific complaints except for the baseline shortness breath increased with exertion. She is currently on home oxygen 4L/min and her oxygen saturation is around 91%. She is followed by Dr. Elsworth Soho for her COPD and other pulmonary issues. She was found on previous CT scan of the chest to have bilateral pulmonary nodules and masses suspicious for lung cancer. A PET scan performed at that time showed no significant activities in the lung nodules and the patient was followed by observation. She had repeat CT scan of the chest performed recently and she is here for evaluation and discussion of her scan results. She has no fever or chills. She has no nausea or vomiting.  MEDICAL HISTORY: Past Medical History:  Diagnosis Date  . ALLERGIC RHINITIS 05/01/2007  . Anxiety   . Arthritis    "hands" (12/28/2015)  . ASTHMA 05/01/2007   "since I was a child"  . Cancer (HCC)    LUNG  . CHF (congestive heart failure) ([place])   . Chronic bronchitis ([place])   . COPD (chronic obstructive pulmonary disease) ([place])   . DEPRESSION 07/21/2008  . DVT (deep venous thrombosis) (HCC) 1960s   LLE  . FATIGUE 10/10/2010  . HYPERTENSION 05/01/2007  . HYPERTHYROIDISM 11/23/2007   Pt endorses having had Graves disease, possibly radioactive iodine x1, but no thyroidectomy  . HYPOTHYROIDISM 08/23/2009  . LUMBAR RADICULOPATHY, RIGHT 08/25/2008  . On home oxygen therapy    "3-4L; qd; all the time" (12/28/2015)  . OSTEOPOROSIS 05/01/2007  . SHOULDER PAIN, LEFT 08/23/2009  . SINUSITIS, CHRONIC 10/10/2010    ALLERGIES:  is  allergic to alendronate sodium.  MEDICATIONS:  Current Outpatient Prescriptions  Medication Sig Dispense Refill  . albuterol (PROVENTIL HFA;VENTOLIN HFA) 108 (90 Base) MCG/ACT inhaler Inhale 2 puffs into the lungs every 6 (six) hours as needed for wheezing or shortness of breath. 1 Inhaler 3  . albuterol (PROVENTIL) (2.5 MG/3ML) 0.083% nebulizer solution Take 3 mLs (2.5 mg total) by nebulization every 4 (four) hours as needed for wheezing. 75 mL 12  . ALPRAZolam (XANAX) 0.25 MG tablet Take 1 tablet (0.25 mg total) by mouth 2 (two) times daily as needed for anxiety. 60 tablet 2  . aspirin EC 81 MG EC tablet Take 1 tablet (81 mg total) by mouth daily. 30 tablet 0  . citalopram (CELEXA) 20 MG tablet Take 1 tablet (20 mg total) by mouth daily. 90 tablet 3  . diphenhydrAMINE-zinc acetate (BENADRYL EXTRA STRENGTH) cream Apply 1 application topically 3 (three) times daily as needed for itching. 28.4 g 0  . feeding supplement, ENSURE ENLIVE, (ENSURE ENLIVE) LIQD Take 237 mLs by mouth 3 (three) times daily between meals. 237 mL 12  . fluticasone furoate-vilanterol (BREO ELLIPTA) 200-25 MCG/INH AEPB Inhale 1 puff into the lungs daily. 1 each 5  . furosemide (LASIX) 40 MG tablet Take 1 tablet (40 mg total) by mouth 2 (two) times daily. 30 tablet 3  . levothyroxine (SYNTHROID, LEVOTHROID) 75 MCG tablet Take 1 tablet (75 mcg total) by mouth daily before breakfast. 30 tablet 0  . OXYGEN Inhale 4 L  into the lungs continuous.     . potassium chloride (K-DUR,KLOR-CON) 10 MEQ tablet Take 10 mEq by mouth 2 (two) times daily.    [name] tiotropium (SPIRIVA HANDIHALER) 18 MCG inhalation capsule Place 1 capsule (18 mcg total) into inhaler and inhale daily. 30 capsule 2  . triamcinolone cream (KENALOG) 0.1 % Apply 1 application topically 2 (two) times daily as needed. 45 g 0   No current facility-administered medications for this visit.     SURGICAL HISTORY:  Past Surgical History:  Procedure Laterality Date  .  APPENDECTOMY    . DILATION AND CURETTAGE OF UTERUS  multiple   history of multiple dialations and curettages and miscarriages, unfortunately never carrying a child to term  . ECTOPIC PREGNANCY SURGERY  "late '60s or early '70s  . ELECTROCARDIOGRAM  06/20/2006  . OOPHORECTOMY Right     REVIEW OF SYSTEMS:  A comprehensive review of systems was negative except for: Constitutional: positive for fatigue Respiratory: positive for cough, dyspnea on exertion and wheezing   PHYSICAL EXAMINATION: General appearance: alert, cooperative, fatigued and no distress Head: Normocephalic, without obvious abnormality, atraumatic Neck: no adenopathy, no JVD, supple, symmetrical, trachea midline and thyroid not enlarged, symmetric, no tenderness/mass/nodules Lymph nodes: Cervical, supraclavicular, and axillary nodes normal. Resp: wheezes bilaterally Back: symmetric, no curvature. ROM normal. No CVA tenderness. Cardio: regular rate and rhythm, S1, S2 normal, no murmur, click, rub or gallop GI: soft, non-tender; bowel sounds normal; no masses,  no organomegaly Extremities: extremities normal, atraumatic, no cyanosis or edema Neurologic: Alert and oriented X 3, normal strength and tone. Normal symmetric reflexes. Normal coordination and gait  ECOG PERFORMANCE STATUS: 1 - Symptomatic but completely ambulatory  There were no vitals taken for this visit.  LABORATORY DATA: Lab Results  Component Value Date   WBC 6.1 08/06/2016   HGB 14.1 08/06/2016   HCT 44.6 08/06/2016   MCV 92.3 08/06/2016   PLT 217 08/06/2016      Chemistry      Component Value Date/Time   NA 142 08/06/2016 1405   K 3.6 08/06/2016 1405   CL 95 (L) 05/22/2016 0404   CO2 35 (H) 08/06/2016 1405   BUN 18.6 08/06/2016 1405   CREATININE 0.8 08/06/2016 1405      Component Value Date/Time   CALCIUM 10.1 08/06/2016 1405   ALKPHOS 77 08/06/2016 1405   AST 27 08/06/2016 1405   ALT 16 08/06/2016 1405   BILITOT 0.61 08/06/2016 1405        RADIOGRAPHIC STUDIES: Ct Chest W Contrast  Result Date: 08/07/2016 CLINICAL DATA:  Lung mass, pneumonia. EXAM: CT CHEST WITH CONTRAST TECHNIQUE: Multidetector CT imaging of the chest was performed during intravenous contrast administration. CONTRAST:  55mL ISOVUE-300 IOPAMIDOL (ISOVUE-300) INJECTION 61% COMPARISON:  Multiple exams, including 05/20/2016 an 05/07/2016 FINDINGS: Cardiovascular: Coronary, aortic arch, and branch vessel atherosclerotic vascular disease. Mediastinum/Nodes: Right paratracheal node 0.6 cm in short axis image 42/2, formerly the same. Right hilar node 1 cm in short axis on image 69/2, formerly 1.2 cm. Lungs/Pleura: Severe centrilobular emphysema. Bochdalek' s hernia on the left containing adipose tissue. 4.0 by 1.9 by 1.0 cm region of consolidation posteriorly in the right upper lobe, improved from 05/20/2016, new compared to 05/07/16, probably pneumonia. Irregular 1.0 by 0.8 cm left upper lobe nodule on image 48/4, stable from recent prior exams. 1.0 by 0.6 cm right upper lobe nodule on image 54/4, stable from recent prior exams. Stable 0.7 by 0.5 cm right lower lobe nodule on image 90/4, not  changed from recent prior exams. There is some architectural distortion and mild nodularity posteriorly in the left upper lobe measuring 0.4 by 0.5 cm on image 44/4, not changed from recent prior exams. Upper Abdomen: Unremarkable Musculoskeletal: Chronic superior endplate compression fracture of L2 with an associated Schmorl's node. IMPRESSION: 1. Improved but not completely resolved right upper lobe pneumonia. 2. Therefore a stable pulmonary nodules over recent prior exams, including the PET-CT from 05/07/2016. These previously demonstrated low-grade metabolic activity and are under surveillance. 3. Severe emphysema. 4. Coronary, aortic arch, and branch vessel atherosclerotic vascular disease. 5. Mildly enlarged right hilar lymph node, although reduced in size from prior, probably reactive  to the pneumonia. 6. Chronic superior endplate compression fracture at L2. Electronically Signed   By: Van Clines M.D.   On: 08/07/2016 08:46    ASSESSMENT AND PLAN: This is a very pleasant 70 years old African-American female with bilateral pulmonary nodules suspicious for low-grade adenocarcinoma versus inflammatory process. The PET scan showed mild metabolic activity in the pulmonary nodules. The recent CT scan of the chest showed significant improvement but not complete resolution of the pulmonary nodules suspicious for resolving pneumonia. I discussed the scan results with the patient today. I recommended for her to continue routine follow-up visit with her pulmonologist Dr. Elsworth Soho for her COPD and inflammatory process. I don't see any concerning findings for malignancy at this point. The patient agreed to the current plan. I would be happy to see the patient the future if she has any concerning findings for malignancy. The patient voices understanding of current disease status and treatment options and is in agreement with the current care plan.  All questions were answered. The patient knows to call the clinic with any problems, questions or concerns. We can certainly see the patient much sooner if necessary.  Disclaimer: This note was dictated with voice recognition software. Similar sounding words can inadvertently be transcribed and may not be corrected upon review.

## 2016-08-14 ENCOUNTER — Encounter: Payer: Commercial Managed Care - HMO | Admitting: Cardiothoracic Surgery

## 2016-08-14 ENCOUNTER — Telehealth: Payer: Self-pay | Admitting: Pulmonary Disease

## 2016-08-14 NOTE — Telephone Encounter (Signed)
Per last OV with our office in March, pt was instructed to follow up in 3 months.    lmtcb X1 to schedule rov with pt.

## 2016-08-15 NOTE — Telephone Encounter (Signed)
Pt returned call and I'm scheduling her an appoint.Jasmine [name]

## 2016-08-15 NOTE — Telephone Encounter (Signed)
lmtcb x1 for pt. 

## 2016-08-18 ENCOUNTER — Ambulatory Visit (INDEPENDENT_AMBULATORY_CARE_PROVIDER_SITE_OTHER): Payer: Commercial Managed Care - HMO | Admitting: Endocrinology

## 2016-08-18 ENCOUNTER — Encounter: Payer: Self-pay | Admitting: Endocrinology

## 2016-08-18 VITALS — BP 104/64 | HR 74 | Ht 60.0 in | Wt 118.0 lb

## 2016-08-18 DIAGNOSIS — E890 Postprocedural hypothyroidism: Secondary | ICD-10-CM

## 2016-08-18 MED ORDER — TRIAMCINOLONE ACETONIDE 0.025 % EX CREA: 1.0000 "application " | TOPICAL_CREAM | Freq: Three times a day (TID) | CUTANEOUS | 3 refills | Status: DC

## 2016-08-18 NOTE — Patient Instructions (Signed)
Please continue the same medication for the thyroid.  I would be happy to see you back here as needed.   Here is a prescription for the dry-skin cream.

## 2016-08-18 NOTE — Progress Notes (Signed)
Subjective:    Patient ID: Jasmine [name], female    DOB: 1946/08/24, 70 y.o.   MRN: [id number]  HPI Pt returns for f/u of post-RAI hypothyroidism (in 2006, she had RAI for hyperthyroidism, due to [place]. she has been on prescribed thyroid hormone therapy since then).  pt states she feels well in general, except for dry skin and itching.  Past Medical History:  Diagnosis Date  . ALLERGIC RHINITIS 05/01/2007  . Anxiety   . Arthritis    "hands" (12/28/2015)  . ASTHMA 05/01/2007   "since I was a child"  . Cancer (HCC)    LUNG  . CHF (congestive heart failure) ([place])   . Chronic bronchitis ([place])   . COPD (chronic obstructive pulmonary disease) ([place])   . DEPRESSION 07/21/2008  . DVT (deep venous thrombosis) (HCC) 1960s   LLE  . FATIGUE 10/10/2010  . HYPERTENSION 05/01/2007  . HYPERTHYROIDISM 11/23/2007   Pt endorses having had Graves disease, possibly radioactive iodine x1, but no thyroidectomy  . HYPOTHYROIDISM 08/23/2009  . LUMBAR RADICULOPATHY, RIGHT 08/25/2008  . On home oxygen therapy    "3-4L; qd; all the time" (12/28/2015)  . OSTEOPOROSIS 05/01/2007  . SHOULDER PAIN, LEFT 08/23/2009  . SINUSITIS, CHRONIC 10/10/2010    Past Surgical History:  Procedure Laterality Date  . APPENDECTOMY    . DILATION AND CURETTAGE OF UTERUS  multiple   history of multiple dialations and curettages and miscarriages, unfortunately never carrying a child to term  . ECTOPIC PREGNANCY SURGERY  "late '60s or early '70s  . ELECTROCARDIOGRAM  06/20/2006  . OOPHORECTOMY Right     Social History   Social History  . Marital status: Married    Spouse name: N/A  . Number of children: N/A  . Years of education: N/A   Occupational History  . RETIRED - Web designer [employer]   Social History Main Topics  . Smoking status: Former Smoker    Packs/day: 1.00    Years: 54.00    Types: Cigarettes    Quit date: 07/21/2015  . Smokeless tobacco: Never Used  . Alcohol use Yes     Comment:  12/28/2015 "might drink a beer/month"  . Drug use: No     Comment: 12/28/2015 "sometimes weekends and holidays"  . Sexual activity: No   Other Topics Concern  . Not on file   Social History Narrative   Lives in [place], [place]. Lives with husband, no children, but husband has 3 kids. Ambulatory, without cane or walker. Smokes cigarettes, currently 1 ppd, smoked for >20-30 yrs. Drinks beer, ~2/day, occasionally liquor, but ? suspect minimizing. Occasional MJ and endorses crack cocaine occasionally    Current Outpatient Prescriptions on File Prior to Visit  Medication Sig Dispense Refill  . albuterol (PROVENTIL HFA;VENTOLIN HFA) 108 (90 Base) MCG/ACT inhaler Inhale 2 puffs into the lungs every 6 (six) hours as needed for wheezing or shortness of breath. 1 Inhaler 3  . albuterol (PROVENTIL) (2.5 MG/3ML) 0.083% nebulizer solution Take 3 mLs (2.5 mg total) by nebulization every 4 (four) hours as needed for wheezing. 75 mL 12  . ALPRAZolam (XANAX) 0.25 MG tablet Take 1 tablet (0.25 mg total) by mouth 2 (two) times daily as needed for anxiety. 60 tablet 2  . aspirin EC 81 MG EC tablet Take 1 tablet (81 mg total) by mouth daily. 30 tablet 0  . citalopram (CELEXA) 20 MG tablet Take 1 tablet (20 mg total) by mouth daily. 90 tablet 3  .  diphenhydrAMINE-zinc acetate (BENADRYL EXTRA STRENGTH) cream Apply 1 application topically 3 (three) times daily as needed for itching. 28.4 g 0  . feeding supplement, ENSURE ENLIVE, (ENSURE ENLIVE) LIQD Take 237 mLs by mouth 3 (three) times daily between meals. 237 mL 12  . fluticasone furoate-vilanterol (BREO ELLIPTA) 200-25 MCG/INH AEPB Inhale 1 puff into the lungs daily. 1 each 5  . furosemide (LASIX) 40 MG tablet Take 1 tablet (40 mg total) by mouth 2 (two) times daily. 30 tablet 3  . levothyroxine (SYNTHROID, LEVOTHROID) 75 MCG tablet Take 1 tablet (75 mcg total) by mouth daily before breakfast. 30 tablet 0  . OXYGEN Inhale 4 L into the lungs continuous.     .  potassium chloride (K-DUR,KLOR-CON) 10 MEQ tablet Take 10 mEq by mouth 2 (two) times daily.    [name] tiotropium (SPIRIVA HANDIHALER) 18 MCG inhalation capsule Place 1 capsule (18 mcg total) into inhaler and inhale daily. 30 capsule 2   No current facility-administered medications on file prior to visit.     Allergies  Allergen Reactions  . Alendronate Sodium Other (See Comments)    Pt does not remember this reaction    Family History  Problem Relation Age of Onset  . Lung cancer Father   . Alcohol abuse Brother   . Diabetes Brother   . Hypertension Other   . Stroke Mother   . Thyroid disease Mother   . Asthma      maternal aunts    BP 104/64   Pulse 74   Ht 5' (1.524 m)   Wt 118 lb (53.5 kg)   SpO2 97% Comment: On 4 L of 02  BMI 23.05 kg/m    Review of Systems Denies weight change.      Objective:   Physical Exam VITAL SIGNS:  See vs page GENERAL: no distress.  Has 02 on.  NECK: There is no palpable thyroid enlargement.  No thyroid nodule is palpable.  No palpable lymphadenopathy at the anterior neck.  SKIN: diffusely dry  Lab Results  Component Value Date   TSH 0.659 05/21/2016      Assessment & Plan:  Hypothyroidism: well-replaced. Patient is advised the following: Patient Instructions  Please continue the same medication for the thyroid.  I would be happy to see you back here as needed.   Here is a prescription for the dry-skin cream.

## 2016-08-25 ENCOUNTER — Other Ambulatory Visit: Payer: Self-pay | Admitting: Licensed Clinical Social Worker

## 2016-08-25 ENCOUNTER — Telehealth: Payer: Self-pay | Admitting: Internal Medicine

## 2016-08-25 ENCOUNTER — Other Ambulatory Visit: Payer: Self-pay

## 2016-08-25 DIAGNOSIS — J449 Chronic obstructive pulmonary disease, unspecified: Secondary | ICD-10-CM | POA: Diagnosis not present

## 2016-08-25 NOTE — Patient Outreach (Signed)
[facility]) Care Management  08/25/2016  SEGEN TITUS December 17, 1945 [id number]   Telephone call to patient for follow up nurse line call.  No answer.  HIPAA compliant voice message left.    Plan: RN Health coach will attempt patient again in the month of November.  [name], RN, MSN [place] [phone]

## 2016-08-25 NOTE — Telephone Encounter (Signed)
Called and spoke to patient.

## 2016-08-25 NOTE — Telephone Encounter (Signed)
Pt called in and has a few questions about her meds.  She would like you to give her a call when you get a chance ?

## 2016-08-25 NOTE — Patient Outreach (Signed)
[facility]) Care Management  08/25/2016  Jasmine [name] 21-May-1946 [id number]   Assessment- CSW completed outreach to patient. Patient answered. She reports that she is doing well. She states that she went to see her Endocrinologist last week and had a question but forgot to ask. She ask for his contact number. Number provided. Patient is agreeable to a home visit tomorrow to discuss progress with goals.  Plan-CSW will complete home visit tomorrow.  Jasmine [name], BSW, MSW, [name] Phone: [phone] Fax: [phone]

## 2016-08-26 ENCOUNTER — Other Ambulatory Visit: Payer: Self-pay

## 2016-08-26 ENCOUNTER — Other Ambulatory Visit: Payer: Self-pay | Admitting: Licensed Clinical Social Worker

## 2016-08-26 ENCOUNTER — Telehealth: Payer: Self-pay | Admitting: Internal Medicine

## 2016-08-26 MED ORDER — FUROSEMIDE 40 MG PO TABS
40.0000 mg | ORAL_TABLET | Freq: Two times a day (BID) | ORAL | 3 refills | Status: DC
Start: 2016-08-26 — End: 2017-06-03

## 2016-08-26 MED ORDER — ALBUTEROL SULFATE (2.5 MG/3ML) 0.083% IN NEBU: 2.5000 mg | INHALATION_SOLUTION | RESPIRATORY_TRACT | 12 refills | Status: DC | PRN

## 2016-08-26 MED ORDER — FLUTICASONE FUROATE-VILANTEROL 200-25 MCG/INH IN AEPB: 1.0000 | INHALATION_SPRAY | Freq: Every day | RESPIRATORY_TRACT | 5 refills | Status: DC

## 2016-08-26 MED ORDER — POTASSIUM CHLORIDE ER 10 MEQ PO TBCR: 10.0000 meq | EXTENDED_RELEASE_TABLET | Freq: Two times a day (BID) | ORAL | 3 refills | Status: DC

## 2016-08-26 MED ORDER — LEVOTHYROXINE SODIUM 75 MCG PO TABS: 75.0000 ug | ORAL_TABLET | Freq: Every day | ORAL | 3 refills | Status: DC

## 2016-08-26 MED ORDER — CITALOPRAM HYDROBROMIDE 20 MG PO TABS: 20.0000 mg | ORAL_TABLET | Freq: Every day | ORAL | 3 refills | Status: DC

## 2016-08-26 MED ORDER — ALBUTEROL SULFATE HFA 108 (90 BASE) MCG/ACT IN AERS: 2.0000 | INHALATION_SPRAY | Freq: Four times a day (QID) | RESPIRATORY_TRACT | 11 refills | Status: DC | PRN

## 2016-08-26 MED ORDER — TIOTROPIUM BROMIDE MONOHYDRATE 18 MCG IN CAPS: 18.0000 ug | ORAL_CAPSULE | Freq: Every day | RESPIRATORY_TRACT | 3 refills | Status: DC

## 2016-08-26 NOTE — Telephone Encounter (Signed)
rx Done hardcopy to [employer]

## 2016-08-26 NOTE — Patient Outreach (Signed)
[facility]) Care Management  [place]  08/26/2016   Jasmine [name] 06-20-1946 [id number]  Subjective: Telephone call to patient for monthly call.  Patient reports she is doing ok.  Asked patient about her thyroid as she called the nurse line.  Patient reports she recently saw Dr. Loanne Drilling concerning her thyroid and everything was fine.  However, she just wanted some information.  Discussed with patient hypothyroidism and dry skin which she mentions.  She states she is waiting on a cream that Dr. Loanne Drilling ordered. Patient reports in the mean time she is using coco-butter cream and that seems to help.  Advised patient using moisturizing creams are best. She verbalized understanding.  Patient reports that her weight is 119 lbs.  Advised patient that her weight is up from last month and that she needs to be careful.  Patient states she is eating well and drinking plenty on ensure. Discussed with patient to contact physician if she gains more than 3 lbs in a day and has shortness of breath and swelling.  She verbalized understanding.  Patient still up in the air about moving back to her home as she is paying 4000/month at [facility].  Patient states social worker to visit today and will discuss with her.    Objective:   Encounter Medications:  Outpatient Encounter Prescriptions as of 08/26/2016  Medication Sig  . albuterol (PROVENTIL HFA;VENTOLIN HFA) 108 (90 Base) MCG/ACT inhaler Inhale 2 puffs into the lungs every 6 (six) hours as needed for wheezing or shortness of breath.  [name] albuterol (PROVENTIL) (2.5 MG/3ML) 0.083% nebulizer solution Take 3 mLs (2.5 mg total) by nebulization every 4 (four) hours as needed for wheezing.  [name] ALPRAZolam (XANAX) 0.25 MG tablet Take 1 tablet (0.25 mg total) by mouth 2 (two) times daily as needed for anxiety.  [name] aspirin EC 81 MG EC tablet Take 1 tablet (81 mg total) by mouth daily.  . citalopram (CELEXA) 20 MG tablet Take 1 tablet (20 mg total) by  mouth daily.  . diphenhydrAMINE-zinc acetate (BENADRYL EXTRA STRENGTH) cream Apply 1 application topically 3 (three) times daily as needed for itching.  . feeding supplement, ENSURE ENLIVE, (ENSURE ENLIVE) LIQD Take 237 mLs by mouth 3 (three) times daily between meals.  . fluticasone furoate-vilanterol (BREO ELLIPTA) 200-25 MCG/INH AEPB Inhale 1 puff into the lungs daily.  . furosemide (LASIX) 40 MG tablet Take 1 tablet (40 mg total) by mouth 2 (two) times daily.  [name] levothyroxine (SYNTHROID, LEVOTHROID) 75 MCG tablet Take 1 tablet (75 mcg total) by mouth daily before breakfast.  . OXYGEN Inhale 4 L into the lungs continuous.   . potassium chloride (K-DUR,KLOR-CON) 10 MEQ tablet Take 10 mEq by mouth 2 (two) times daily.  [name] tiotropium (SPIRIVA HANDIHALER) 18 MCG inhalation capsule Place 1 capsule (18 mcg total) into inhaler and inhale daily.  [name] triamcinolone (KENALOG) 0.025 % cream Apply 1 application topically 3 (three) times daily. As needed for itching   No facility-administered encounter medications on file as of 08/26/2016.     Functional Status:  In your present state of health, do you have any difficulty performing the following activities: 07/29/2016 06/26/2016  Hearing? [place]? N -  Difficulty concentrating or making decisions? N -  Walking or climbing stairs? Y -  Dressing or bathing? N -  Doing errands, shopping? N -  Preparing Food and eating ? N Y  Using the Toilet? - N  In the past six months, have  you accidently leaked urine? N N  Do you have problems with loss of bowel control? N N  Managing your Medications? N N  Managing your Finances? N N  Housekeeping or managing your Housekeeping? Y N  Some recent data might be hidden    Fall/Depression Screening: PHQ 2/9 Scores 08/26/2016 07/29/2016 07/29/2016 06/26/2016 06/16/2016 06/05/2016 04/01/2016  PHQ - 2 Score 0 0 0 1 1 1 1   PHQ- 9 Score - - - - - - -    Assessment: Patient continues to benefit from health coach outreach  for disease management and support.    Plan:  [facility] CM Care Plan Problem One   Flowsheet Row Most Recent Value  Care Plan Problem One  Knowledge Deficit Heart Failure  Role Documenting the Problem One  [place] for Problem One  Active  THN Long Term Goal (31-90 days)  Patient will be able to verbalize heart failure zones within the next 90 days.    THN Long Term Goal Start Date  08/26/16  Interventions for Problem One [place] reviewed heart failure zones with patient and when to notify physician.        RN Health Coach will contact patient in the month of December and patient agrees to next outreach.  [name], RN, MSN [place] [phone]

## 2016-08-26 NOTE — Patient Outreach (Signed)
[facility]) Care Management  [facility] Social Work  08/26/2016  Jasmine [name] 1946/02/04 [id number]  Encounter Medications:  Outpatient Encounter Prescriptions as of 08/26/2016  Medication Sig  . albuterol (PROVENTIL HFA;VENTOLIN HFA) 108 (90 Base) MCG/ACT inhaler Inhale 2 puffs into the lungs every 6 (six) hours as needed for wheezing or shortness of breath.  [name] albuterol (PROVENTIL) (2.5 MG/3ML) 0.083% nebulizer solution Take 3 mLs (2.5 mg total) by nebulization every 4 (four) hours as needed for wheezing.  [name] ALPRAZolam (XANAX) 0.25 MG tablet Take 1 tablet (0.25 mg total) by mouth 2 (two) times daily as needed for anxiety.  [name] aspirin EC 81 MG EC tablet Take 1 tablet (81 mg total) by mouth daily.  . citalopram (CELEXA) 20 MG tablet Take 1 tablet (20 mg total) by mouth daily.  . diphenhydrAMINE-zinc acetate (BENADRYL EXTRA STRENGTH) cream Apply 1 application topically 3 (three) times daily as needed for itching.  . feeding supplement, ENSURE ENLIVE, (ENSURE ENLIVE) LIQD Take 237 mLs by mouth 3 (three) times daily between meals.  . fluticasone furoate-vilanterol (BREO ELLIPTA) 200-25 MCG/INH AEPB Inhale 1 puff into the lungs daily.  . furosemide (LASIX) 40 MG tablet Take 1 tablet (40 mg total) by mouth 2 (two) times daily.  [name] levothyroxine (SYNTHROID, LEVOTHROID) 75 MCG tablet Take 1 tablet (75 mcg total) by mouth daily before breakfast.  . OXYGEN Inhale 4 L into the lungs continuous.   . potassium chloride (K-DUR,KLOR-CON) 10 MEQ tablet Take 10 mEq by mouth 2 (two) times daily.  [name] tiotropium (SPIRIVA HANDIHALER) 18 MCG inhalation capsule Place 1 capsule (18 mcg total) into inhaler and inhale daily.  [name] triamcinolone (KENALOG) 0.025 % cream Apply 1 application topically 3 (three) times daily. As needed for itching   No facility-administered encounter medications on file as of 08/26/2016.     Functional Status:  In your present state of health, do you have any difficulty  performing the following activities: 07/29/2016 06/26/2016  Hearing? [place]? N -  Difficulty concentrating or making decisions? N -  Walking or climbing stairs? Y -  Dressing or bathing? N -  Doing errands, shopping? N -  Preparing Food and eating ? N Y  Using the Toilet? - N  In the past six months, have you accidently leaked urine? N N  Do you have problems with loss of bowel control? N N  Managing your Medications? N N  Managing your Finances? N N  Housekeeping or managing your Housekeeping? Y N  Some recent data might be hidden    Fall/Depression Screening:  PHQ 2/9 Scores 08/26/2016 07/29/2016 07/29/2016 06/26/2016 06/16/2016 06/05/2016 04/01/2016  PHQ - 2 Score 0 0 0 _0 PHQ- 9 Score - - - - - - -    Assessment: CSW completed home visit on 08/26/16. Patient denies being in any pain or discomfort at this time. Patient does have a slight cough but denies feeling sick. Patient reports having a normal appetite. Patient reports that she has gained some weight and is now 119 pounds. Patient shares that she still has not made a decision on whether or not she will continue to reside at [facility] or go back to her residence and stay in her home. She reports that she has been praying about this. Patient reports that she is unable to pay this months rent because it has increased to $4,073. She shows CSW her recent bill and the last bill and  states that she does not understand the second personal service fee that was charged this month. Patient paid last month's rent which was $3,322 and she is having difficulty affording the $4,073 rent for the month of November. CSW provided patient with a pros and cons list and assisted her with completing it. Patient states that someone is interested in renting her residence and eventually buying it but she still is unsure on where she needs to reside. CSW encouraged her to continue to discuss this decision with her talk therapist that  comes weekly to the facility that she resides at. CSW provided appropriate coping tool education for depression. Patient reports that she continues to talk on the phone with her ex husband and that this has brought her happiness with reconnecting with him. She reports that she went to church this past week and it was very helpful for her as well. Patient request that CSW go with her to the front desk to advocate for herself and better understand each charge for her rent. Patient reports that she is still interested in moving in a room that she will share with someone but has not heard back from anyone since discussing it last time with [facility] staff members.   CSW walked with patient up to front desk at [facility] facility per patient request in order to better support her. CSW spoke to Insurance underwriter. She explained that patient's bill was increased because her medication administration fee was increased. She reports that she knows patient can take medications by herself but that she will need for patient's PCP to fax ALL PRESCRIPTIONS to facility before changing her pharmacy to [facility] who will deliver all medications in a bubble pack and patient can administer them herself and will not have to pay so much for the service rate. [name] Manager Marguarite Arbour states that if she gets prescriptions faxed over to her from PCP then she can take this fee off this month's bill.  CSW contacted PCP office and left message with Dr. Gwynn Burly nurse Nira Conn requesting that all prescriptions be faxed to [facility] at [phone]. CSW also questioned if there were any updates on any rooms being available that will be less expensive for patient. She reports that patient will need to speak to Clair Gulling with sales to discuss downsizing to either a shared room or a smaller single room. She reports that at this time these rooms are not available but that Clair Gulling can come speak with her today about  her options once they become available.   CSW spent a lot of time encouraging patient to speak up and ask questions and for help at facility when needed. CSW provided assignments for patient to complete which include: contacting PCP office to ensure that prescriptions were faxed to [facility], meet with Clair Gulling about different room options and ask for help when needed.   CSW also contacted [place] and updated her on the information listed above.   Plan: CSW will follow up within one month to provide ongoing social work assistance.  [facility] CM Care Plan Problem One   Flowsheet Row Most Recent Value  Care Plan Problem One  Need for community resource education  Role Documenting the Problem One  Clinical Social Worker  Care Plan for Problem One  Active  THN CM Short Term Goal #1 (0-30 days)  Patient will gain personal care resource information within 30 days as evidenced by her desire to move from senior community back  to home  [facility] CM Short Term Goal #1 Start Date  07/29/16  [facility] CM Short Term Goal #1 Met Date  08/26/16  Interventions for Short Term Goal #1  Resource information has been provided however patient has not decided if she is going to stay at [facility] or move back to her home  [facility] CM Short Term Goal #2 (0-30 days)  Patient will make decision on whether she will stay at [facility] or move back to her residence within 30 days due to having difficulty making decision  THN CM Short Term Goal #2 Start Date  08/26/16  Interventions for Short Term Goal #2  CSW completed additional home visit and provided pro and con list for her to complete in order to help her make decision. CSW encouraged her to speak about this decision process with her talk therapist as well. CSW will provide assistance as needed.     [name], BSW, MSW, [email or web address] Phone: [phone] Fax: [phone]

## 2016-08-26 NOTE — Telephone Encounter (Signed)
Pt called stating she need [name] to fax all of her rx to [facility] fax # [phone] att: Marguarite Arbour. They are changing pharmacy so they need to written rx send to them. Please help

## 2016-08-27 ENCOUNTER — Ambulatory Visit: Payer: Commercial Managed Care - HMO

## 2016-08-27 NOTE — Telephone Encounter (Signed)
Faxed to [facility]

## 2016-09-01 ENCOUNTER — Ambulatory Visit: Payer: TRICARE For Life (TFL) | Admitting: Pulmonary Disease

## 2016-09-02 DIAGNOSIS — F321 Major depressive disorder, single episode, moderate: Secondary | ICD-10-CM | POA: Diagnosis not present

## 2016-09-02 DIAGNOSIS — F419 Anxiety disorder, unspecified: Secondary | ICD-10-CM | POA: Diagnosis not present

## 2016-09-03 NOTE — Telephone Encounter (Signed)
faxed

## 2016-09-03 NOTE — Telephone Encounter (Signed)
Spoke to [place]- some of the RX are still missing  Please send the following   Aspirin 81mg   Xanax .25mg  Ensure enlive 245mL Benadryl cream 2-0.1% Triaminicalone acetonide 0.1%   She is going to be using [facility], so they will need to give these directly to them. Please fax to [phone] att: Sha[place]

## 2016-09-04 ENCOUNTER — Encounter: Payer: Self-pay | Admitting: Pulmonary Disease

## 2016-09-04 ENCOUNTER — Ambulatory Visit (INDEPENDENT_AMBULATORY_CARE_PROVIDER_SITE_OTHER): Payer: Commercial Managed Care - HMO | Admitting: Pulmonary Disease

## 2016-09-04 DIAGNOSIS — J9611 Chronic respiratory failure with hypoxia: Secondary | ICD-10-CM

## 2016-09-04 DIAGNOSIS — J449 Chronic obstructive pulmonary disease, unspecified: Secondary | ICD-10-CM | POA: Diagnosis not present

## 2016-09-04 DIAGNOSIS — R911 Solitary pulmonary nodule: Secondary | ICD-10-CM

## 2016-09-04 MED ORDER — AMOXICILLIN-POT CLAVULANATE 875-125 MG PO TABS: 1.0000 | ORAL_TABLET | Freq: Two times a day (BID) | ORAL | 0 refills | Status: DC

## 2016-09-04 NOTE — Assessment & Plan Note (Signed)
Use 4 L oxygen at all times continuous We will ask DME to supply bigger oxygen tanks so that she does not run out when she goes out

## 2016-09-04 NOTE — Assessment & Plan Note (Signed)
Chest chest is slightly follow-up CT chest Wo contrast in 6 months

## 2016-09-04 NOTE — Progress Notes (Signed)
   Subjective:    Patient ID: Jasmine [name], female    DOB: [birth year], 70 y.o.   MRN: [id number]  HPI  70 y.o smoker for FU of COPD .Seen for initial pulmonary eval 02/03/13  She was started on O2 in march 2014 & uses 33 liters 02 continuously and w/ sleep, and 4L pulsed with activity.  She has smoked Since her 68's - 40  Pyrs. Quit 07/21/2015.History of cocaine use in recent past. Husband passed away 04-Jan-2016 while she was hospitalized.  09/04/2016  She was admitted 12/2015 for staph aureus bacteremia and treated with IV antibiotics for prolonged duration She was again hospitalized 06/2016 for right upper lobe community acquired pneumonia  Today her dyspnea appears to be at her baseline, she reports increased sinus congestion and cough with yellow sputum production Her oxygen tank had run out on arrival and her saturation was 56% on room air-this improved rapidly to 93% on 4 L continuous oxygen to be used are bigger oxygen tank  She denies wheezing or pedal edema or nocturnal dyspnea She is compliant with [facility]  I reviewed her prior imaging studies including CT, PET scan and evaluation by oncology   Significant tests/ events  CT chest 10/2011 >> showed Moderate centrilobular emphysema. Small bilateral pleural effusions with dependent atelectasis  CT chest 07/2016-resolving right upper lobe consolidation, spiculated subcentimeter nodules that have been noted before with low-grade PET hypermetabolism  PFT >FEV1 at 0.47 L( 30%), ratio 39 , No BD response (<200cc)  2014 completed Pulm Rehab    Review of Systems neg for any significant sore throat, dysphagia, itching, sneezing, nasal congestion or excess/ purulent secretions, fever, chills, sweats, unintended wt loss, pleuritic or exertional cp, hempoptysis, orthopnea pnd or change in chronic leg swelling. Also denies presyncope, palpitations, heartburn, abdominal pain, nausea, vomiting, diarrhea or change in bowel or  urinary habits, dysuria,hematuria, rash, arthralgias, visual complaints, headache, numbness weakness or ataxia.     Objective:   Physical Exam  Gen. Pleasant, thin, in no distress, normal affect ENT - no lesions, no post nasal drip Neck: No JVD, no thyromegaly, no carotid bruits Lungs: no use of accessory muscles, no dullness to percussion, decreased without rales or rhonchi  Cardiovascular: Rhythm regular, heart sounds  normal, no murmurs or gallops, no peripheral edema Abdomen: soft and non-tender, no hepatosplenomegaly, BS normal. Musculoskeletal: No deformities, no cyanosis or clubbing Neuro:  alert, non focal       Assessment & Plan:

## 2016-09-04 NOTE — Addendum Note (Signed)
Addended by: Virl Cagey on: 09/04/2016 11:32 AM   Modules accepted: Orders

## 2016-09-04 NOTE — Assessment & Plan Note (Signed)
Augmentin 875 mg twice daily for 7 days  Continue Spiriva and breo Use albuterol as needed only

## 2016-09-04 NOTE — Patient Instructions (Signed)
You'll need bigger oxygen tanks- we will ask [facility] to supply  Use 4 L oxygen at all times continuous  Augmentin 875 mg twice daily for 7 days

## 2016-09-05 DIAGNOSIS — R911 Solitary pulmonary nodule: Secondary | ICD-10-CM | POA: Diagnosis not present

## 2016-09-05 DIAGNOSIS — J9611 Chronic respiratory failure with hypoxia: Secondary | ICD-10-CM | POA: Diagnosis not present

## 2016-09-05 DIAGNOSIS — J449 Chronic obstructive pulmonary disease, unspecified: Secondary | ICD-10-CM | POA: Diagnosis not present

## 2016-09-08 ENCOUNTER — Other Ambulatory Visit: Payer: Self-pay | Admitting: Pulmonary Disease

## 2016-09-08 DIAGNOSIS — F321 Major depressive disorder, single episode, moderate: Secondary | ICD-10-CM | POA: Diagnosis not present

## 2016-09-08 DIAGNOSIS — F419 Anxiety disorder, unspecified: Secondary | ICD-10-CM | POA: Diagnosis not present

## 2016-09-08 NOTE — Telephone Encounter (Signed)
She needs cont O2 @ 4L POC will not work

## 2016-09-08 NOTE — Telephone Encounter (Signed)
Pt is requesting a POC at 4L, as the tank she currently has is too big.  RA please advise if you are okay with placing a order to [facility] to eval pt for POC. Thanks.

## 2016-09-09 NOTE — Telephone Encounter (Signed)
lmtcb X1 for pt to make aware of RA's recs.

## 2016-09-09 NOTE — Telephone Encounter (Signed)
Returned Pt. Call and she stated she was told from [place] that this is possible, I expressed to her Dr. Elsworth Soho message, but she wanted to follow up in the office to discuss further. An Appointment was set up with TP. Nothing further is needed at this time.

## 2016-09-15 ENCOUNTER — Ambulatory Visit: Payer: Commercial Managed Care - HMO | Admitting: Adult Health

## 2016-09-18 ENCOUNTER — Telehealth: Payer: Self-pay | Admitting: Pulmonary Disease

## 2016-09-18 NOTE — Telephone Encounter (Signed)
lmtcb X1 for patient 

## 2016-09-19 ENCOUNTER — Other Ambulatory Visit: Payer: Self-pay | Admitting: Licensed Clinical Social Worker

## 2016-09-19 NOTE — Telephone Encounter (Signed)
Called and spoke with pt and she stated that she has been trying to find a place that would be able to supply her with oxygen other than the larger tanks that she has now.  She stated that her insurance will not cover anything else and that the home health company cannot find anything else for her to try.  Nothing further is needed.

## 2016-09-19 NOTE — Telephone Encounter (Signed)
lmomtcb x 2 for the pt.  

## 2016-09-19 NOTE — Telephone Encounter (Signed)
Pt returning call a/b her o2 please advise.Hillery Hunter

## 2016-09-19 NOTE — Patient Outreach (Signed)
[facility]) Care Management  09/19/2016  Jasmine [name] [birth year] [id number]   Assessment- CSW completed call to patient. Patient answered. Patient states that she unable to talk right now because she is at her house "getting things taken care of." She is unable to state anything else and request that CSW call back on another day.  Plan-CSW will complete next outreach within 1-2 weeks.  [name], BSW, MSW, [email or web address] Phone: [phone] Fax: [phone]

## 2016-09-22 ENCOUNTER — Other Ambulatory Visit: Payer: Self-pay | Admitting: Licensed Clinical Social Worker

## 2016-09-22 NOTE — Patient Outreach (Signed)
[facility]) Care Management  09/22/2016  Jasmine [name] 09/14/1946 [id number]   Assessment- CSW completed outreach to patient and patient answered. Patient reports that she has ben having trouble with her phone but it should be working appropriately now. Patient states that she has moved back to her home from [facility] facility this past Thursday. CSW did not know of this decision. Patient states that even after she was able to get the medication administration fee taken off of her last bill that it was still too expensive for her to continue residing there. However, there were other room options that were more affordable that CSW thought she was going to consider but patient has decided against that. Patient moved back to her home on 09/18/16. She states that her ex husband assisted her and she paid someone else to assist. She states "Everything has been going fine since I moved back here but it's too early to tell I guess." CSW reviewed personal care resources, transportation resources and mental health resources over the phone. Patient states that she would like CSW to follow up within two-three weeks to see how she is doing with new relocation.  Plan-CSW will follow up with patient within three weeks.  [name], BSW, MSW, [email or web address] Phone: [phone] Fax: [phone]

## 2016-09-23 ENCOUNTER — Other Ambulatory Visit: Payer: Self-pay

## 2016-09-23 NOTE — Patient Outreach (Signed)
[facility]) Care Management  [place]  09/23/2016   JENEEN COLAO April 13, 1946 [id number]  Subjective: Telephone call to patient for monthly call. Patient reports that she has moved back to her home last Thursday.  She states she is still adjusting.  Patient reports she has her medications. She denies any issues with her medications. Patient states she has help in the home and that she has been able to cook some meals for herself and also admits to having some take out meals. Warned patient about sodium content in take out meals.  She verbalized understanding.  Patient states her weights are about the same as they were at [facility].  Discussed with patient heart failure zones and when to notify physician.  She verbalized understanding. Talked with patient about calling if she has questions or concerns.  Also reminded patient of 24 hour nurse line if she has problems after hours. She verbalized understanding.    Objective:   Encounter Medications:  Outpatient Encounter Prescriptions as of 09/23/2016  Medication Sig  . albuterol (PROVENTIL HFA;VENTOLIN HFA) 108 (90 Base) MCG/ACT inhaler Inhale 2 puffs into the lungs every 6 (six) hours as needed for wheezing or shortness of breath.  [name] albuterol (PROVENTIL) (2.5 MG/3ML) 0.083% nebulizer solution Take 3 mLs (2.5 mg total) by nebulization every 4 (four) hours as needed for wheezing.  [name] ALPRAZolam (XANAX) 0.25 MG tablet Take 1 tablet (0.25 mg total) by mouth 2 (two) times daily as needed for anxiety.  [name] aspirin EC 81 MG EC tablet Take 1 tablet (81 mg total) by mouth daily.  . citalopram (CELEXA) 20 MG tablet Take 1 tablet (20 mg total) by mouth daily.  . diphenhydrAMINE-zinc acetate (BENADRYL EXTRA STRENGTH) cream Apply 1 application topically 3 (three) times daily as needed for itching.  . feeding supplement, ENSURE ENLIVE, (ENSURE ENLIVE) LIQD Take 237 mLs by mouth 3 (three) times daily between meals.  . fluticasone  furoate-vilanterol (BREO ELLIPTA) 200-25 MCG/INH AEPB Inhale 1 puff into the lungs daily.  . furosemide (LASIX) 40 MG tablet Take 1 tablet (40 mg total) by mouth 2 (two) times daily.  [name] levothyroxine (SYNTHROID, LEVOTHROID) 75 MCG tablet Take 1 tablet (75 mcg total) by mouth daily before breakfast.  . OXYGEN Inhale 4 L into the lungs continuous.   . potassium chloride (K-DUR) 10 MEQ tablet Take 1 tablet (10 mEq total) by mouth 2 (two) times daily.  [name] tiotropium (SPIRIVA HANDIHALER) 18 MCG inhalation capsule Place 1 capsule (18 mcg total) into inhaler and inhale daily.  [name] triamcinolone (KENALOG) 0.025 % cream Apply 1 application topically 3 (three) times daily. As needed for itching  . amoxicillin-clavulanate (AUGMENTIN) 875-125 MG tablet Take 1 tablet by mouth 2 (two) times daily. (Patient not taking: Reported on 09/23/2016)  . potassium chloride (K-DUR,KLOR-CON) 10 MEQ tablet Take 10 mEq by mouth 2 (two) times daily.   No facility-administered encounter medications on file as of 09/23/2016.     Functional Status:  In your present state of health, do you have any difficulty performing the following activities: 07/29/2016 06/26/2016  Hearing? [place]? N -  Difficulty concentrating or making decisions? N -  Walking or climbing stairs? Y -  Dressing or bathing? N -  Doing errands, shopping? N -  Preparing Food and eating ? N Y  Using the Toilet? - N  In the past six months, have you accidently leaked urine? N N  Do you have problems with loss of bowel  control? N N  Managing your Medications? N N  Managing your Finances? N N  Housekeeping or managing your Housekeeping? Y N  Some recent data might be hidden    Fall/Depression Screening: PHQ 2/9 Scores 09/23/2016 08/26/2016 07/29/2016 07/29/2016 06/26/2016 06/16/2016 06/05/2016  PHQ - 2 Score 0 0 0 0 1 1 1   PHQ- 9 Score - - - - - - -    Assessment: Patient continues to benefit from health coach outreach for disease management and support.     Plan:  [facility] CM Care Plan Problem One   Flowsheet Row Most Recent Value  Care Plan Problem One  Knowledge Deficit Heart Failure  Role Documenting the Problem One  [place] for Problem One  Active  THN Long Term Goal (31-90 days)  Patient will be able to verbalize heart failure zones within the next 90 days.    THN Long Term Goal Start Date  09/23/16  Interventions for Problem One Long Term Goal  RN Health Coach discussed heart failure zones with patient and when to notify physician.       RN Health Coach will contact patient in the month of January and patient agrees to next outreach.  [name], RN, MSN [place] [phone]

## 2016-09-24 DIAGNOSIS — J449 Chronic obstructive pulmonary disease, unspecified: Secondary | ICD-10-CM | POA: Diagnosis not present

## 2016-09-29 ENCOUNTER — Telehealth: Payer: Self-pay | Admitting: Pulmonary Disease

## 2016-09-29 NOTE — Telephone Encounter (Signed)
RA  Please Advise  This pt called in the office and stated she wanted an order to be placed for her to have portable oxygen. She stated she reached out to [facility] and they stated that they do not have a unit that will work with what she needs. She is requesting to change DME to see if she can get this some where else  Instructions      Return in about 1 month (around 10/04/2016) for TP.  You'll need bigger oxygen tanks- we will ask [facility] to supply  Use 4 L oxygen at all times continuous  Augmentin 875 mg twice daily for 7 days

## 2016-10-05 DIAGNOSIS — J9611 Chronic respiratory failure with hypoxia: Secondary | ICD-10-CM | POA: Diagnosis not present

## 2016-10-05 DIAGNOSIS — R911 Solitary pulmonary nodule: Secondary | ICD-10-CM | POA: Diagnosis not present

## 2016-10-05 DIAGNOSIS — J449 Chronic obstructive pulmonary disease, unspecified: Secondary | ICD-10-CM | POA: Diagnosis not present

## 2016-10-06 NOTE — Telephone Encounter (Signed)
She needs 4 L oxygen-portable concentrator will not be able to provide this level of oxygen Please arrange follow-up with TP to see if oxygen needs have decreased

## 2016-10-06 NOTE — Telephone Encounter (Signed)
Spoke with pt. She is aware of RA's response. Pt has an upcoming appointment on 10/08/16 and will discuss this with RA then. Nothing further was needed.

## 2016-10-08 ENCOUNTER — Ambulatory Visit: Payer: TRICARE For Life (TFL) | Admitting: Pulmonary Disease

## 2016-10-08 ENCOUNTER — Ambulatory Visit: Payer: Commercial Managed Care - HMO | Admitting: Endocrinology

## 2016-10-14 ENCOUNTER — Other Ambulatory Visit: Payer: Self-pay | Admitting: Licensed Clinical Social Worker

## 2016-10-14 NOTE — Patient Outreach (Signed)
[facility]) Care Management  10/14/2016  TIONDRA HEROUX 04/19/1946 [id number]   Assessment- CSW completed call to patient. Patient answered. She reports that she is doing well. She reports that she is still in the process of unpacking but that her friend has been assisting her. She states that she "relaxed over the holiday" because moving has been tiring. She reports that she is showering, doing her laundry, taking her medications, making her own bed, etc on her own without difficulty. She was educated on Meals on Wheels and CSW asked if she would like to be put on the wait list but she declined. She reports that her friend can assist her with preparing meals. She declines any further updates at this time.  Plan-CSW will continue to provide social work support and follow up within 1-2 weeks.  [name], BSW, MSW, [email or web address] Phone: [phone] Fax: [phone]

## 2016-10-15 NOTE — Telephone Encounter (Signed)
error 

## 2016-10-22 ENCOUNTER — Other Ambulatory Visit: Payer: Self-pay | Admitting: Licensed Clinical Social Worker

## 2016-10-22 NOTE — Patient Outreach (Signed)
[facility]) Care Management  10/22/2016  Jasmine [name] 01/16/1946 [id number]   Assessment- CSW completed call to patient and patient answered. Patient reports that she is doing well but is still unpacking. She reports "There are so many boxes that I still need to go through." CSW questioned if she can complete home visit at new residence but patient reports that she rather not have anyone over until she is done moving all of her belongings. She suggest calling back within the next 3 weeks to schedule home visit. Patient states that her significant other is still assisting her with unpacking and has been a great support to her. She denies being in any pain or discomfort at this time. She shares that she is taking all of her medications as prescribed. She reports that she has changed insurances from [facility] to [facility] for the year of 2018. However, she reports that she has not received any information in the mail from [facility]. CSW provided customer service number for [facility] for patient to contact.  Plan-CSW will follow up within three weeks.  [name], BSW, MSW, [email or web address] Phone: [phone] Fax: [phone]

## 2016-10-27 NOTE — Addendum Note (Signed)
Addended by: Luretha Rued on: 10/27/2016 04:38 PM   Modules accepted: Miquel Dunn

## 2016-10-27 NOTE — Telephone Encounter (Signed)
This encounter was created in error - please disregard.

## 2016-10-28 ENCOUNTER — Other Ambulatory Visit: Payer: Self-pay

## 2016-10-28 NOTE — Patient Outreach (Signed)
[facility]) Care Management  [place]  10/28/2016   Jasmine [name] Apr 03, 1946 [id number]  Subjective: Telephone call to patient for monthly call.  Patient reports she is doing ok still trying to get situated being at home. Patient reports that she has changed to [place] health care for her insurance.  Patient has made the insurance change with her pharmacy for her medications. Patient reports that her weight is at 115 lbs.  Discussed with patient heart failure and when to notify her physician.  She verbalized understanding.    Objective:   Encounter Medications:  Outpatient Encounter Prescriptions as of 10/28/2016  Medication Sig  . albuterol (PROVENTIL HFA;VENTOLIN HFA) 108 (90 Base) MCG/ACT inhaler Inhale 2 puffs into the lungs every 6 (six) hours as needed for wheezing or shortness of breath.  [name] albuterol (PROVENTIL) (2.5 MG/3ML) 0.083% nebulizer solution Take 3 mLs (2.5 mg total) by nebulization every 4 (four) hours as needed for wheezing.  [name] ALPRAZolam (XANAX) 0.25 MG tablet Take 1 tablet (0.25 mg total) by mouth 2 (two) times daily as needed for anxiety.  [name] aspirin EC 81 MG EC tablet Take 1 tablet (81 mg total) by mouth daily.  . citalopram (CELEXA) 20 MG tablet Take 1 tablet (20 mg total) by mouth daily.  . diphenhydrAMINE-zinc acetate (BENADRYL EXTRA STRENGTH) cream Apply 1 application topically 3 (three) times daily as needed for itching.  . feeding supplement, ENSURE ENLIVE, (ENSURE ENLIVE) LIQD Take 237 mLs by mouth 3 (three) times daily between meals.  . fluticasone furoate-vilanterol (BREO ELLIPTA) 200-25 MCG/INH AEPB Inhale 1 puff into the lungs daily.  . furosemide (LASIX) 40 MG tablet Take 1 tablet (40 mg total) by mouth 2 (two) times daily.  [name] levothyroxine (SYNTHROID, LEVOTHROID) 75 MCG tablet Take 1 tablet (75 mcg total) by mouth daily before breakfast.  . OXYGEN Inhale 4 L into the lungs continuous.   . potassium chloride (K-DUR) 10 MEQ tablet  Take 1 tablet (10 mEq total) by mouth 2 (two) times daily.  . potassium chloride (K-DUR,KLOR-CON) 10 MEQ tablet Take 10 mEq by mouth 2 (two) times daily.  [name] tiotropium (SPIRIVA HANDIHALER) 18 MCG inhalation capsule Place 1 capsule (18 mcg total) into inhaler and inhale daily.  [name] triamcinolone (KENALOG) 0.025 % cream Apply 1 application topically 3 (three) times daily. As needed for itching  . amoxicillin-clavulanate (AUGMENTIN) 875-125 MG tablet Take 1 tablet by mouth 2 (two) times daily. (Patient not taking: Reported on 10/28/2016)   No facility-administered encounter medications on file as of 10/28/2016.     Functional Status:  In your present state of health, do you have any difficulty performing the following activities: 07/29/2016 06/26/2016  Hearing? [place]? N -  Difficulty concentrating or making decisions? N -  Walking or climbing stairs? Y -  Dressing or bathing? N -  Doing errands, shopping? N -  Preparing Food and eating ? N Y  Using the Toilet? - N  In the past six months, have you accidently leaked urine? N N  Do you have problems with loss of bowel control? N N  Managing your Medications? N N  Managing your Finances? N N  Housekeeping or managing your Housekeeping? Y N  Some recent data might be hidden    Fall/Depression Screening: PHQ 2/9 Scores 10/28/2016 09/23/2016 08/26/2016 07/29/2016 07/29/2016 06/26/2016 06/16/2016  PHQ - 2 Score 0 0 0 0 0 1 1  PHQ- 9 Score - - - - - - -  Assessment: Patient continues to benefit from health coach outreach for disease management and support.    Plan:  [facility] CM Care Plan Problem One   Flowsheet Row Most Recent Value  Care Plan Problem One  Knowledge Deficit Heart Failure  Role Documenting the Problem One  [place] for Problem One  Active  THN Long Term Goal (31-90 days)  Patient will be able to verbalize heart failure zones within the next 90 days.    THN Long Term Goal Start Date  10/28/16 [goal continued]   Interventions for Problem One Long Term Goal  RN Health Coach revieweed heart failure zones with patient and when to notify physician.       RN Health Coach will contact patient in the month of February and patient agrees to next outreach.  [name], RN, MSN [place] [phone]

## 2016-10-30 ENCOUNTER — Telehealth: Payer: Self-pay | Admitting: Pulmonary Disease

## 2016-10-30 NOTE — Telephone Encounter (Signed)
Spoke with patient, states that she spoke with [facility] and has been trying to get something smaller for her O2 and more portable. Pt was recommended to have an order sent to [facility] to get her a POC.  Fax# [phone]  Phone# [phone]   Please advise Dr Elsworth Soho is you are okay with [place] sending an order to [facility] for pt to get POC. Thanks.

## 2016-10-31 NOTE — Telephone Encounter (Signed)
lmtcb X1 

## 2016-10-31 NOTE — Telephone Encounter (Signed)
Pl see my response to earlier phone note - As of last OV 08/2016 - She needs 4 L oxygen-portable concentrator will not be able to provide this level of oxygen Please arrange follow-up with TP to see if oxygen needs have decreased

## 2016-11-04 NOTE — Telephone Encounter (Signed)
Called and spoke with pt and she is aware of appt with TP on 1/23 at 2.

## 2016-11-11 ENCOUNTER — Other Ambulatory Visit: Payer: Self-pay | Admitting: Licensed Clinical Social Worker

## 2016-11-11 ENCOUNTER — Encounter: Payer: Self-pay | Admitting: Adult Health

## 2016-11-11 ENCOUNTER — Ambulatory Visit (INDEPENDENT_AMBULATORY_CARE_PROVIDER_SITE_OTHER): Payer: Medicare Other | Admitting: Adult Health

## 2016-11-11 VITALS — BP 100/60 | Temp 98.1°F | Ht 60.0 in | Wt 112.8 lb

## 2016-11-11 DIAGNOSIS — J9611 Chronic respiratory failure with hypoxia: Secondary | ICD-10-CM

## 2016-11-11 DIAGNOSIS — R911 Solitary pulmonary nodule: Secondary | ICD-10-CM | POA: Diagnosis not present

## 2016-11-11 DIAGNOSIS — J449 Chronic obstructive pulmonary disease, unspecified: Secondary | ICD-10-CM | POA: Diagnosis not present

## 2016-11-11 NOTE — Patient Outreach (Signed)
[facility]) Care Management  11/11/2016  TALLI KULESZA [birth year] [id number]   Assessment-CSW completed outreach attempt today. CSW unable to reach patient successfully. CSW left a HIPPA compliant voice message encouraging patient to return call once available.   Plan-CSW will await return call or complete an additional outreach if needed within two weeks.  [name], BSW, MSW, [email or web address] Phone: [phone] Fax: [phone]

## 2016-11-11 NOTE — Progress Notes (Signed)
@Patient  ID: Jasmine [name], female    DOB: 1946-06-16, 71 y.o.   MRN: [id number]  Chief Complaint  Patient presents with  . Follow-up    COPD /O2     Referring provider: Biagio Borg, MD  HPI: 71 y.o smoker for FU of COPD .Seen for initial pulmonary eval 02/03/13  She was started on O2 in march 2014 &uses 33 liters 02 continuously and w/ sleep, and 4L pulsed with activity.  She has smoked Since her 16's - 56 Pyrs. Quit 07/21/2015.History of cocaine use in recent past. Husband passed away 01-21-2016 while she was hospitalized.   TEST CT chest 10/2011 >>showed Moderate centrilobular emphysema. Small bilateral pleural effusions with dependent atelectasis  CT chest 07/2016-resolving right upper lobe consolidation, spiculated subcentimeter nodules that have been noted before with low-grade PET hypermetabolism  PFT >FEV1 at 0.47 L( 30%), ratio 39 , No BD response (<200cc)  2014 completed Pulm Rehab   11/17/2016 Follow up : COPD/O2 RF /Lung nodules  Pt returns for 2 month follow up .  She is on O2 4 l/m continuous flow . She wanted to be checked to see if she can get alternative O2 device so she can be more mobile. She did not qualify for small POC due to O2 demand in past. Wants to be recheced.  Walk test in office showed o2 adequate on 3ll/m continous flow to keep O2 sat>90%. We discussed changing to Simply GO which will allow more mobility but give continuous flow up to 3l/m and pulsing up to 6l/m .   She has COPD remains on BREO and Spiriva . No flare of cough or dyspnea.   She has known lung nodules that have shown mild metabolic act on PET .  Serial CT has shown stable nodules (since 04/2016)   Allergies  Allergen Reactions  . Alendronate Sodium Other (See Comments)    Pt does not remember this reaction    Immunization History  Administered Date(s) Administered  . Influenza Split 07/10/2011, 08/18/2012  . Influenza Whole 07/21/2008, 08/23/2009, 10/10/2010  .  Influenza, High Dose Seasonal PF 07/10/2016  . Influenza,inj,Quad PF,36+ Mos 08/12/2013, 07/10/2014  . Pneumococcal Conjugate-13 11/30/2014  . Pneumococcal Polysaccharide-23 07/21/2007, 01/12/2013  . Td 08/23/2009  . Zoster 07/20/2006    Past Medical History:  Diagnosis Date  . ALLERGIC RHINITIS 05/01/2007  . Anxiety   . Arthritis    "hands" (12/28/2015)  . ASTHMA 05/01/2007   "since I was a child"  . Cancer (HCC)    LUNG  . CHF (congestive heart failure) ([place])   . Chronic bronchitis ([place])   . COPD (chronic obstructive pulmonary disease) ([place])   . DEPRESSION 07/21/2008  . DVT (deep venous thrombosis) (HCC) 1960s   LLE  . FATIGUE 10/10/2010  . HYPERTENSION 05/01/2007  . HYPERTHYROIDISM 11/23/2007   Pt endorses having had Graves disease, possibly radioactive iodine x1, but no thyroidectomy  . HYPOTHYROIDISM 08/23/2009  . LUMBAR RADICULOPATHY, RIGHT 08/25/2008  . On home oxygen therapy    "3-4L; qd; all the time" (12/28/2015)  . OSTEOPOROSIS 05/01/2007  . SHOULDER PAIN, LEFT 08/23/2009  . SINUSITIS, CHRONIC 10/10/2010    Tobacco History: History  Smoking Status  . Former Smoker  . Packs/day: 1.00  . Years: 54.00  . Types: Cigarettes  . Quit date: 07/21/2015  Smokeless Tobacco  . Never Used   Counseling given: Not Answered   Outpatient Encounter Prescriptions as of 11/11/2016  Medication Sig  . albuterol (PROVENTIL HFA;VENTOLIN HFA) 108 (  90 Base) MCG/ACT inhaler Inhale 2 puffs into the lungs every 6 (six) hours as needed for wheezing or shortness of breath.  [name] albuterol (PROVENTIL) (2.5 MG/3ML) 0.083% nebulizer solution Take 3 mLs (2.5 mg total) by nebulization every 4 (four) hours as needed for wheezing.  [name] ALPRAZolam (XANAX) 0.25 MG tablet Take 1 tablet (0.25 mg total) by mouth 2 (two) times daily as needed for anxiety.  [name] aspirin EC 81 MG EC tablet Take 1 tablet (81 mg total) by mouth daily.  . citalopram (CELEXA) 20 MG tablet Take 1 tablet (20 mg total) by mouth daily.  .  diphenhydrAMINE-zinc acetate (BENADRYL EXTRA STRENGTH) cream Apply 1 application topically 3 (three) times daily as needed for itching.  . feeding supplement, ENSURE ENLIVE, (ENSURE ENLIVE) LIQD Take 237 mLs by mouth 3 (three) times daily between meals.  . fluticasone furoate-vilanterol (BREO ELLIPTA) 200-25 MCG/INH AEPB Inhale 1 puff into the lungs daily.  . furosemide (LASIX) 40 MG tablet Take 1 tablet (40 mg total) by mouth 2 (two) times daily.  [name] levothyroxine (SYNTHROID, LEVOTHROID) 75 MCG tablet Take 1 tablet (75 mcg total) by mouth daily before breakfast.  . OXYGEN Inhale 4 L into the lungs continuous.   . potassium chloride (K-DUR) 10 MEQ tablet Take 1 tablet (10 mEq total) by mouth 2 (two) times daily.  [name] tiotropium (SPIRIVA HANDIHALER) 18 MCG inhalation capsule Place 1 capsule (18 mcg total) into inhaler and inhale daily.  [name] triamcinolone (KENALOG) 0.025 % cream Apply 1 application topically 3 (three) times daily. As needed for itching  . [DISCONTINUED] amoxicillin-clavulanate (AUGMENTIN) 875-125 MG tablet Take 1 tablet by mouth 2 (two) times daily. (Patient not taking: Reported on 10/28/2016)  . [DISCONTINUED] potassium chloride (K-DUR,KLOR-CON) 10 MEQ tablet Take 10 mEq by mouth 2 (two) times daily.   No facility-administered encounter medications on file as of 11/11/2016.      Review of Systems  Constitutional:   No  weight loss, night sweats,  Fevers, chills, fatigue, or  lassitude.  HEENT:   No headaches,  Difficulty swallowing,  Tooth/dental problems, or  Sore throat,                No sneezing, itching, ear ache, nasal congestion, post nasal drip,   CV:  No chest pain,  Orthopnea, PND, swelling in lower extremities, anasarca, dizziness, palpitations, syncope.   GI  No heartburn, indigestion, abdominal pain, nausea, vomiting, diarrhea, change in bowel habits, loss of appetite, bloody stools.   Resp: No shortness of breath with exertion or at rest.  No excess mucus, no  productive cough,  No non-productive cough,  No coughing up of blood.  No change in color of mucus.  No wheezing.  No chest wall deformity  Skin: no rash or lesions.  GU: no dysuria, change in color of urine, no urgency or frequency.  No flank pain, no hematuria   MS:  No joint pain or swelling.  No decreased range of motion.  No back pain.    Physical Exam  BP 100/60   Temp 98.1 F (36.7 C) (Oral)   Ht 5' (1.524 m)   Wt 112 lb 12.8 oz (51.2 kg)   SpO2 94%   BMI 22.03 kg/m   GEN: A/Ox3; pleasant , NAD, elderly , thin    HEENT:  /AT,  EACs-clear, TMs-wnl, NOSE-clear, THROAT-clear, no lesions, no postnasal drip or exudate noted.   NECK:  Supple w/ fair ROM; no JVD; normal carotid impulses w/o bruits; no thyromegaly  or nodules palpated; no lymphadenopathy.    RESP  Decreased BS in bases . no accessory muscle use, no dullness to percussion  CARD:  RRR, no m/r/g, no peripheral edema, pulses intact, no cyanosis or clubbing.  GI:   Soft & nt; nml bowel sounds; no organomegaly or masses detected.   Musco: Warm bil, no deformities or joint swelling noted.   Neuro: alert, no focal deficits noted.    Skin: Warm, no lesions or rashes  Psych:  No change in mood or affect. No depression or anxiety.  No memory loss.  Lab Results:  CBC    Component Value Date/Time   WBC 6.1 08/06/2016 1404   WBC 8.5 05/22/2016 0404   RBC 4.83 08/06/2016 1404   RBC 4.26 05/22/2016 0404   HGB 14.1 08/06/2016 1404   HCT 44.6 08/06/2016 1404   PLT 217 08/06/2016 1404   MCV 92.3 08/06/2016 1404   MCH 29.2 08/06/2016 1404   MCH 29.3 05/22/2016 0404   MCHC 31.6 08/06/2016 1404   MCHC 32.4 05/22/2016 0404   RDW 16.3 (H) 08/06/2016 1404   LYMPHSABS 1.7 08/06/2016 1404   MONOABS 0.5 08/06/2016 1404   EOSABS 0.4 08/06/2016 1404   BASOSABS 0.0 08/06/2016 1404    BMET    Component Value Date/Time   NA 142 08/06/2016 1405   K 3.6 08/06/2016 1405   CL 95 (L) 05/22/2016 0404   CO2 35 (H)  08/06/2016 1405   GLUCOSE 93 08/06/2016 1405   BUN 18.6 08/06/2016 1405   CREATININE 0.8 08/06/2016 1405   CALCIUM 10.1 08/06/2016 1405   GFRNONAA >60 05/22/2016 0404   GFRAA >60 05/22/2016 0404    BNP    Component Value Date/Time   BNP 20.5 05/20/2016 2124    ProBNP    Component Value Date/Time   PROBNP 43.0 02/03/2013 1651    Imaging: No results found.   Assessment & Plan:   COPD (chronic obstructive pulmonary disease) ([place]) Compensated without flare   Plan  Patient Instructions  Continue on Spiriva 1 puff daily  Continue on BREO daily  Rinse after use.  Decrease  Oxygen 3l/m .  Order to [facility] for [employer] .  Follow up Dr. Elsworth Soho  In 3 months and  As needed               Chronic respiratory failure with hypoxia ([place]) Continue on O2  Will change to Simply Go concentraor   Plan  Patient Instructions  Continue on Spiriva 1 puff daily  Continue on BREO daily  Rinse after use.  Decrease  Oxygen 3l/m .  Order to [place] for [employer] .  Follow up Dr. Elsworth Soho  In 3 months and  As needed               Solitary pulmonary nodule Cont w/ serial CT follow up  follow up Ct chest in May 2018 is planned and ordered.      Rexene Edison, NP 11/17/2016

## 2016-11-11 NOTE — Patient Instructions (Addendum)
Continue on Spiriva 1 puff daily  Continue on BREO daily  Rinse after use.  Decrease  Oxygen 3l/m .  Order to [place] for [employer] .  Follow up Dr. Elsworth Soho  In 3 months and  As needed

## 2016-11-17 NOTE — Assessment & Plan Note (Signed)
Continue on O2  Will change to Simply Go concentraor   Plan  Patient Instructions  Continue on Spiriva 1 puff daily  Continue on BREO daily  Rinse after use.  Decrease  Oxygen 3l/m .  Order to [place] for [employer] .  Follow up Dr. Elsworth Soho  In 3 months and  As needed

## 2016-11-17 NOTE — Assessment & Plan Note (Signed)
Cont w/ serial CT follow up  follow up Ct chest in May 2018 is planned and ordered.

## 2016-11-17 NOTE — Assessment & Plan Note (Signed)
Compensated without flare   Plan  Patient Instructions  Continue on Spiriva 1 puff daily  Continue on BREO daily  Rinse after use.  Decrease  Oxygen 3l/m .  Order to [place] for [employer] .  Follow up Dr. Elsworth Soho  In 3 months and  As needed

## 2016-11-19 ENCOUNTER — Ambulatory Visit: Payer: TRICARE For Life (TFL) | Admitting: Pulmonary Disease

## 2016-11-19 ENCOUNTER — Telehealth: Payer: Self-pay | Admitting: Adult Health

## 2016-11-19 DIAGNOSIS — J449 Chronic obstructive pulmonary disease, unspecified: Secondary | ICD-10-CM

## 2016-11-19 DIAGNOSIS — J9611 Chronic respiratory failure with hypoxia: Secondary | ICD-10-CM

## 2016-11-19 NOTE — Telephone Encounter (Signed)
TP signed the form and I have faxed it  Pt aware  Nothing further needed

## 2016-11-19 NOTE — Telephone Encounter (Signed)
Spoke with the pt  She states [facility] told her that they could not accept our order for o2 the way it was written  I called [facility] and spoke with [place]  She states that there is just a form that provider has to sign so they can go out and eval her on the POC  I asked that she fax this to triage  Will await fax

## 2016-11-19 NOTE — Telephone Encounter (Signed)
Form received and placed in TP's lookat to be signed

## 2016-11-24 ENCOUNTER — Other Ambulatory Visit: Payer: Self-pay | Admitting: Licensed Clinical Social Worker

## 2016-11-24 ENCOUNTER — Encounter: Payer: Self-pay | Admitting: Licensed Clinical Social Worker

## 2016-11-24 NOTE — Patient Outreach (Signed)
[facility]) Care Management  11/24/2016  Jasmine [name] Dec 16, 1945 [id number]   Assessment- CSW outreach call to patient. Patient answered. She reports that she continues to do well with her transition back home from [place]. She states that she is administering her medications herself and has started taking a multi vitamin. She reports that her significant other continues to provide care giving to her. She questions if there is a way that he can get paid to be her caregiver. CSW informed her that there is a program with DSS that pays patient's caregivers but that the patient has to have Medicaid and she does not. She expressed understanding. Patient reports that she continues to take ensure. She states that she has been buying the [employer] generic brand of ensure and that it is working well for her. CSW questioned if she could complete home visit but patient states that she does not need a home visit at this time and does not have any social work needs. She states her appreciation for social work emotional support during this time and assistance with resources. She is agreeable to keeping this CSW's contact information in case she needs assistance in the future. CSW will complete social work discharge at this time.   Plan-CSW will notify PCP and [place] of social work discharge and complete discharge at this time. [place] remains involved.  [name], BSW, MSW, [email or web address] Phone: [phone] Fax: [phone]

## 2016-11-25 ENCOUNTER — Other Ambulatory Visit: Payer: Self-pay

## 2016-11-25 NOTE — Patient Outreach (Signed)
[facility]) Care Management  [place]  11/25/2016   EMONII CLEMETSON 10-Sep-1946 [id number]  Subjective: Telephone call to patient for monthly call. Patient reports he is doing ok. She reports she continues to have help in the home as she continues to adjust being back at home.  Patient has no issues managing her medications.  Patient reports that her doctor is working on getting her smaller oxygen tanks as the big ones are cumbersome for her. Patient continues to monitor her heart failure.  Discussed with patient heart failure and when to notify physician.  She verbalized understanding.    Objective:   Encounter Medications:  Outpatient Encounter Prescriptions as of 11/25/2016  Medication Sig  . albuterol (PROVENTIL HFA;VENTOLIN HFA) 108 (90 Base) MCG/ACT inhaler Inhale 2 puffs into the lungs every 6 (six) hours as needed for wheezing or shortness of breath.  [name] albuterol (PROVENTIL) (2.5 MG/3ML) 0.083% nebulizer solution Take 3 mLs (2.5 mg total) by nebulization every 4 (four) hours as needed for wheezing.  [name] ALPRAZolam (XANAX) 0.25 MG tablet Take 1 tablet (0.25 mg total) by mouth 2 (two) times daily as needed for anxiety.  [name] aspirin EC 81 MG EC tablet Take 1 tablet (81 mg total) by mouth daily.  . citalopram (CELEXA) 20 MG tablet Take 1 tablet (20 mg total) by mouth daily.  . diphenhydrAMINE-zinc acetate (BENADRYL EXTRA STRENGTH) cream Apply 1 application topically 3 (three) times daily as needed for itching.  . feeding supplement, ENSURE ENLIVE, (ENSURE ENLIVE) LIQD Take 237 mLs by mouth 3 (three) times daily between meals.  . fluticasone furoate-vilanterol (BREO ELLIPTA) 200-25 MCG/INH AEPB Inhale 1 puff into the lungs daily.  . furosemide (LASIX) 40 MG tablet Take 1 tablet (40 mg total) by mouth 2 (two) times daily.  [name] levothyroxine (SYNTHROID, LEVOTHROID) 75 MCG tablet Take 1 tablet (75 mcg total) by mouth daily before breakfast.  . OXYGEN Inhale 4 L into the  lungs continuous.   . potassium chloride (K-DUR) 10 MEQ tablet Take 1 tablet (10 mEq total) by mouth 2 (two) times daily.  [name] tiotropium (SPIRIVA HANDIHALER) 18 MCG inhalation capsule Place 1 capsule (18 mcg total) into inhaler and inhale daily.  [name] triamcinolone (KENALOG) 0.025 % cream Apply 1 application topically 3 (three) times daily. As needed for itching   No facility-administered encounter medications on file as of 11/25/2016.     Functional Status:  In your present state of health, do you have any difficulty performing the following activities: 07/29/2016 06/26/2016  Hearing? [place]? N -  Difficulty concentrating or making decisions? N -  Walking or climbing stairs? Y -  Dressing or bathing? N -  Doing errands, shopping? N -  Preparing Food and eating ? N Y  Using the Toilet? - N  In the past six months, have you accidently leaked urine? N N  Do you have problems with loss of bowel control? N N  Managing your Medications? N N  Managing your Finances? N N  Housekeeping or managing your Housekeeping? Y N  Some recent data might be hidden    Fall/Depression Screening: PHQ 2/9 Scores 11/25/2016 10/28/2016 09/23/2016 08/26/2016 07/29/2016 07/29/2016 06/26/2016  PHQ - 2 Score 0 0 0 0 0 0 1  PHQ- 9 Score - - - - - - -    Assessment: Patient continues to benefit from health coach outreach for disease management and support.    Plan:  [facility] CM Care Plan Problem One  Flowsheet Row Most Recent Value  Care Plan Problem One  Knowledge Deficit Heart Failure  Role Documenting the Problem One  [place] for Problem One  Active  THN Long Term Goal (31-90 days)  Patient will be able to verbalize heart failure zones within the next 90 days.    THN Long Term Goal Start Date  11/25/16 [goal continued]  Interventions for Problem One Long Term Goal  RN Health Coach reiterated heart failure zones with patient and when to notify physician.       RN Health Coach will contact patient  in the month of March and patient agrees to next outreach.  [name], RN, MSN [place] [phone]

## 2016-12-02 ENCOUNTER — Ambulatory Visit: Payer: Self-pay

## 2016-12-19 NOTE — Addendum Note (Signed)
Addended by: Parke Poisson E on: 12/19/2016 03:17 PM   Modules accepted: Orders

## 2016-12-19 NOTE — Telephone Encounter (Signed)
Received fax from [place] with a note stating: [name] will not be providing portable oxygen concentrators at this time.  Discussed with TP Order placed to change DME companies to one that does provide POCs

## 2016-12-23 ENCOUNTER — Other Ambulatory Visit: Payer: Self-pay

## 2016-12-23 NOTE — Patient Outreach (Signed)
[name]) Care Management  [place]  12/23/2016   Jasmine [name] 21-Oct-1945 [id number]  Subjective: Telephone call to patient for monthly call.  Patient reports she is doing good.  She reports that her ex-husband is helping her in her home and is doing a great job. Patient voices no problems with medications and uses a weekly pill planner.  Patient reports current weight is 116 lbs.  Patient continues to watch salt intake.  Reiterated with patient the importance of weight ans diet are to managing heart failure.  Also reiterated with patient signs of heart failure and when to notify physician.  She verbalized understanding.    Objective:   Encounter Medications:  Outpatient Encounter Prescriptions as of 12/23/2016  Medication Sig  . albuterol (PROVENTIL HFA;VENTOLIN HFA) 108 (90 Base) MCG/ACT inhaler Inhale 2 puffs into the lungs every 6 (six) hours as needed for wheezing or shortness of breath.  [name] albuterol (PROVENTIL) (2.5 MG/3ML) 0.083% nebulizer solution Take 3 mLs (2.5 mg total) by nebulization every 4 (four) hours as needed for wheezing.  [name] ALPRAZolam (XANAX) 0.25 MG tablet Take 1 tablet (0.25 mg total) by mouth 2 (two) times daily as needed for anxiety.  [name] aspirin EC 81 MG EC tablet Take 1 tablet (81 mg total) by mouth daily.  . citalopram (CELEXA) 20 MG tablet Take 1 tablet (20 mg total) by mouth daily.  . diphenhydrAMINE-zinc acetate (BENADRYL EXTRA STRENGTH) cream Apply 1 application topically 3 (three) times daily as needed for itching.  . feeding supplement, ENSURE ENLIVE, (ENSURE ENLIVE) LIQD Take 237 mLs by mouth 3 (three) times daily between meals.  . fluticasone furoate-vilanterol (BREO ELLIPTA) 200-25 MCG/INH AEPB Inhale 1 puff into the lungs daily.  . furosemide (LASIX) 40 MG tablet Take 1 tablet (40 mg total) by mouth 2 (two) times daily.  [name] levothyroxine (SYNTHROID, LEVOTHROID) 75 MCG tablet Take 1 tablet (75 mcg total) by mouth daily before  breakfast.  . OXYGEN Inhale 4 L into the lungs continuous.   . potassium chloride (K-DUR) 10 MEQ tablet Take 1 tablet (10 mEq total) by mouth 2 (two) times daily.  [name] tiotropium (SPIRIVA HANDIHALER) 18 MCG inhalation capsule Place 1 capsule (18 mcg total) into inhaler and inhale daily.  [name] triamcinolone (KENALOG) 0.025 % cream Apply 1 application topically 3 (three) times daily. As needed for itching   No facility-administered encounter medications on file as of 12/23/2016.     Functional Status:  In your present state of health, do you have any difficulty performing the following activities: 07/29/2016 06/26/2016  Hearing? [place]? N -  Difficulty concentrating or making decisions? N -  Walking or climbing stairs? Y -  Dressing or bathing? N -  Doing errands, shopping? N -  Preparing Food and eating ? N Y  Using the Toilet? - N  In the past six months, have you accidently leaked urine? N N  Do you have problems with loss of bowel control? N N  Managing your Medications? N N  Managing your Finances? N N  Housekeeping or managing your Housekeeping? Y N  Some recent data might be hidden    Fall/Depression Screening: PHQ 2/9 Scores 12/23/2016 11/25/2016 10/28/2016 09/23/2016 08/26/2016 07/29/2016 07/29/2016  PHQ - 2 Score 0 0 0 0 0 0 0  PHQ- 9 Score - - - - - - -    Assessment: Patient continues to benefit from health coach outreach for disease management and support.  Plan:  [facility] CM Care Plan Problem One   Flowsheet Row Most Recent Value  Care Plan Problem One  Knowledge Deficit Heart Failure  Role Documenting the Problem One  [place] for Problem One  Active  THN Long Term Goal (31-90 days)  Patient will be able to verbalize heart failure zones within the next 90 days.    THN Long Term Goal Start Date  12/23/16 [goal continued]  Interventions for Problem One Long Term Goal  RN Health Coach reviewed heart failure zones with patient and when to notify physician.  Reiterated importance of daily weights, excerise, and restricting salt intake.       RN Health Coach will contact patient in the month of April and patient agrees to next outreach.  [name], RN, MSN [place] [phone]

## 2017-01-12 ENCOUNTER — Telehealth: Payer: Self-pay | Admitting: Pulmonary Disease

## 2017-01-12 NOTE — Telephone Encounter (Signed)
Spoke with patient, states that she is wanting a small O2 system and portable system. Pt aware that she will have to speak with her DME to see what they carry and what her insurance will cover for her to get. Pt is going to contact her DME to have them tell her exactly what is available to her so that we can write a prescription for a specific device. Pt will call back once she know so that we can place an order.

## 2017-01-19 ENCOUNTER — Telehealth: Payer: Self-pay | Admitting: Pulmonary Disease

## 2017-01-19 MED ORDER — ALBUTEROL SULFATE HFA 108 (90 BASE) MCG/ACT IN AERS: 1.0000 | INHALATION_SPRAY | Freq: Four times a day (QID) | RESPIRATORY_TRACT | 5 refills | Status: DC | PRN

## 2017-01-19 NOTE — Telephone Encounter (Signed)
Called and spoke to pt. Pt states her insurance no longer covers Ventolin but will cover Proair. Proair has been sent to preferred pharmacy. Pt verbalized understanding and denied any further questions or concerns at this time.

## 2017-01-22 ENCOUNTER — Other Ambulatory Visit: Payer: Self-pay

## 2017-01-22 NOTE — Patient Outreach (Signed)
[facility]) Care Management  [place]  01/22/2017   Jasmine [name] 1946-07-12 [id number]  Subjective: Telephone call to patient for monthly call.  Patient reports she is doing good.  She reports that she is still working on getting a smaller oxygen tank.  She states the doctor wants her to have continuous oxygen as opposed to intermittent.  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  Patient reports that her last weight was at 110 lbs.  She is not drinking the ensure as regular as she was due to cost.  Patient denies problems with heart failure and is following the zone chart.  Reiterated with patient heart failure zones and when to seek help.  She verbalized understanding.    Objective:   Encounter Medications:  Outpatient Encounter Prescriptions as of 01/22/2017  Medication Sig  . albuterol (PROAIR HFA) 108 (90 Base) MCG/ACT inhaler Inhale 1-2 puffs into the lungs every 6 (six) hours as needed for wheezing or shortness of breath.  [name] albuterol (PROVENTIL) (2.5 MG/3ML) 0.083% nebulizer solution Take 3 mLs (2.5 mg total) by nebulization every 4 (four) hours as needed for wheezing.  [name] ALPRAZolam (XANAX) 0.25 MG tablet Take 1 tablet (0.25 mg total) by mouth 2 (two) times daily as needed for anxiety.  [name] aspirin EC 81 MG EC tablet Take 1 tablet (81 mg total) by mouth daily.  . citalopram (CELEXA) 20 MG tablet Take 1 tablet (20 mg total) by mouth daily.  . diphenhydrAMINE-zinc acetate (BENADRYL EXTRA STRENGTH) cream Apply 1 application topically 3 (three) times daily as needed for itching.  . feeding supplement, ENSURE ENLIVE, (ENSURE ENLIVE) LIQD Take 237 mLs by mouth 3 (three) times daily between meals.  . fluticasone furoate-vilanterol (BREO ELLIPTA) 200-25 MCG/INH AEPB Inhale 1 puff into the lungs daily.  . furosemide (LASIX) 40 MG tablet Take 1 tablet (40 mg total) by mouth 2 (two) times daily.  [name] levothyroxine (SYNTHROID, LEVOTHROID) 75  MCG tablet Take 1 tablet (75 mcg total) by mouth daily before breakfast.  . OXYGEN Inhale 4 L into the lungs continuous.   . potassium chloride (K-DUR) 10 MEQ tablet Take 1 tablet (10 mEq total) by mouth 2 (two) times daily.  [name] tiotropium (SPIRIVA HANDIHALER) 18 MCG inhalation capsule Place 1 capsule (18 mcg total) into inhaler and inhale daily.  [name] triamcinolone (KENALOG) 0.025 % cream Apply 1 application topically 3 (three) times daily. As needed for itching   No facility-administered encounter medications on file as of 01/22/2017.     Functional Status:  In your present state of health, do you have any difficulty performing the following activities: 07/29/2016 06/26/2016  Hearing? [place]? N -  Difficulty concentrating or making decisions? N -  Walking or climbing stairs? Y -  Dressing or bathing? N -  Doing errands, shopping? N -  Preparing Food and eating ? N Y  Using the Toilet? - N  In the past six months, have you accidently leaked urine? N N  Do you have problems with loss of bowel control? N N  Managing your Medications? N N  Managing your Finances? N N  Housekeeping or managing your Housekeeping? Y N  Some recent data might be hidden    Fall/Depression Screening: PHQ 2/9 Scores 01/22/2017 12/23/2016 11/25/2016 10/28/2016 09/23/2016 08/26/2016 07/29/2016  PHQ - 2 Score 0 0 0 0 0 0 0  PHQ- 9 Score - - - - - - -  Assessment: Patient continues to benefit from health coach outreach for disease management and support.   Plan:  [facility] CM Care Plan Problem One     Most Recent Value  Care Plan Problem One  Knowledge Deficit Heart Failure  Role Documenting the Problem One  [place] for Problem One  Active  THN Long Term Goal (31-90 days)  Patient will be able to verbalize heart failure zones within the next 90 days.    THN Long Term Goal Start Date  01/22/17 [goal continued]  Interventions for Problem One Long Term Goal  RN Health Coach reiterated heart failure zones  with patient and when to notify physician. Reiterated importance of daily weights, excerise, and restricting salt intake.       RN Health Coach will contact patient in the month of May and patient agrees to next outreach.  [name], RN, MSN [place] [phone]

## 2017-02-17 ENCOUNTER — Ambulatory Visit: Payer: TRICARE For Life (TFL) | Admitting: Pulmonary Disease

## 2017-02-19 ENCOUNTER — Other Ambulatory Visit: Payer: Self-pay

## 2017-02-19 NOTE — Patient Outreach (Signed)
[facility]) Care Management  [place]  02/19/2017   Jasmine [name] 05/16/1946 [id number]  Subjective: Telephone call to patient for monthly call.  Patient reports she still working on her oxygen tanks.  Patient continues to weigh and eat a low salt diet.  Discussed with patient her [place] change.  Patient able to give plan number.  According to plan patient is not eligible for [facility].  Explained this to patient and offered [place].  Patient reports she is doing well and declined services.  Patient offers no questions at this time.    Objective:   Encounter Medications:  Outpatient Encounter Prescriptions as of 02/19/2017  Medication Sig  . albuterol (PROAIR HFA) 108 (90 Base) MCG/ACT inhaler Inhale 1-2 puffs into the lungs every 6 (six) hours as needed for wheezing or shortness of breath.  [name] albuterol (PROVENTIL) (2.5 MG/3ML) 0.083% nebulizer solution Take 3 mLs (2.5 mg total) by nebulization every 4 (four) hours as needed for wheezing.  [name] ALPRAZolam (XANAX) 0.25 MG tablet Take 1 tablet (0.25 mg total) by mouth 2 (two) times daily as needed for anxiety.  [name] aspirin EC 81 MG EC tablet Take 1 tablet (81 mg total) by mouth daily.  . citalopram (CELEXA) 20 MG tablet Take 1 tablet (20 mg total) by mouth daily.  . diphenhydrAMINE-zinc acetate (BENADRYL EXTRA STRENGTH) cream Apply 1 application topically 3 (three) times daily as needed for itching.  . feeding supplement, ENSURE ENLIVE, (ENSURE ENLIVE) LIQD Take 237 mLs by mouth 3 (three) times daily between meals.  . fluticasone furoate-vilanterol (BREO ELLIPTA) 200-25 MCG/INH AEPB Inhale 1 puff into the lungs daily.  . furosemide (LASIX) 40 MG tablet Take 1 tablet (40 mg total) by mouth 2 (two) times daily.  [name] levothyroxine (SYNTHROID, LEVOTHROID) 75 MCG tablet Take 1 tablet (75 mcg total) by mouth daily before breakfast.  . OXYGEN Inhale 4 L into the lungs continuous.   .  potassium chloride (K-DUR) 10 MEQ tablet Take 1 tablet (10 mEq total) by mouth 2 (two) times daily.  [name] tiotropium (SPIRIVA HANDIHALER) 18 MCG inhalation capsule Place 1 capsule (18 mcg total) into inhaler and inhale daily.  [name] triamcinolone (KENALOG) 0.025 % cream Apply 1 application topically 3 (three) times daily. As needed for itching   No facility-administered encounter medications on file as of 02/19/2017.     Functional Status:  In your present state of health, do you have any difficulty performing the following activities: 07/29/2016 06/26/2016  Hearing? [place]? N -  Difficulty concentrating or making decisions? N -  Walking or climbing stairs? Y -  Dressing or bathing? N -  Doing errands, shopping? N -  Preparing Food and eating ? N Y  Using the Toilet? - N  In the past six months, have you accidently leaked urine? N N  Do you have problems with loss of bowel control? N N  Managing your Medications? N N  Managing your Finances? N N  Housekeeping or managing your Housekeeping? Y N  Some recent data might be hidden    Fall/Depression Screening: Fall Risk  02/19/2017 01/22/2017 12/23/2016  Falls in the past year? Yes Yes Yes  Number falls in past yr: - - -  Injury with Fall? - - -  Risk for fall due to : - - -  Follow up - - -   PHQ 2/9 Scores 02/19/2017 01/22/2017 12/23/2016 11/25/2016 10/28/2016 09/23/2016 08/26/2016  PHQ -  2 Score 0 0 0 0 0 0 0  PHQ- 9 Score - - - - - - -    Assessment: Patient managing heart failure and offers no questions.  Plan:  [facility] CM Care Plan Problem One     Most Recent Value  Care Plan Problem One  Knowledge Deficit Heart Failure  Role Documenting the Problem One  [place] for Problem One  Active  THN Long Term Goal (31-90 days)  Patient will be able to verbalize heart failure zones within the next 90 days.    THN Long Term Goal Start Date  01/22/17 [goal continued]  [facility] Long Term Goal Met Date  02/19/17  Interventions for Problem One  Long Term Goal  goal met     [place] will notify physician of case closure. RN Health Coach will notify care management assistant of case closure.    [name], RN, MSN [place] [phone]

## 2017-02-23 ENCOUNTER — Ambulatory Visit (INDEPENDENT_AMBULATORY_CARE_PROVIDER_SITE_OTHER): Payer: Medicare Other | Admitting: Pulmonary Disease

## 2017-02-23 ENCOUNTER — Encounter: Payer: Self-pay | Admitting: Pulmonary Disease

## 2017-02-23 DIAGNOSIS — J449 Chronic obstructive pulmonary disease, unspecified: Secondary | ICD-10-CM | POA: Diagnosis not present

## 2017-02-23 DIAGNOSIS — R911 Solitary pulmonary nodule: Secondary | ICD-10-CM

## 2017-02-23 DIAGNOSIS — J9611 Chronic respiratory failure with hypoxia: Secondary | ICD-10-CM | POA: Diagnosis not present

## 2017-02-23 NOTE — Addendum Note (Signed)
Addended by: Valerie Salts on: 02/23/2017 02:09 PM   Modules accepted: Orders

## 2017-02-23 NOTE — Assessment & Plan Note (Signed)
Repeat CT chest has been scheduled for 5/16-6 month follow-up but she may have a conflict on this date, will reschedule appointment

## 2017-02-23 NOTE — Progress Notes (Signed)
   Subjective:    Patient ID: Jasmine [name], female    DOB: 08/29/1946, 71 y.o.   MRN: [id number]  HPI  71 y.o smoker for FU of COPD .Seen for initial pulmonary eval 02/03/13  She was started on O2 in march 2014 &uses 33 liters 02 continuously and w/ sleep, and 4L pulsed with activity.  She has smoked Since her 78's - 44 Pyrs. Quit 07/21/2015.History of cocaine use in recent past. Husband passed away 01/22/2016 while she was hospitalized.  Chief Complaint  Patient presents with  . Follow-up    3 month follow up. States that breathing has been the same since the last visit.    On her last visit 10/2016, she was decreased to 3 L oxygen at rest and 6 L pulse with activity in the hope of getting her to simply go portable concentrator-unfortunately she has not received this yet. She states that she has trouble carrying the oxygen tank around.  She denies wheezing or pedal edema or nocturnal dyspnea She feels that pro-air does not work as well as Science writer had to change due to insurance reasons She is compliant with Spiriva and breo    Significant tests/ events reviewed   CT chest 10/2011 >>showed Moderate centrilobular emphysema. Small bilateral pleural effusions with dependent atelectasis  CT chest 07/2016-resolving right upper lobe consolidation, spiculated subcentimeter nodules that have been noted before with low-grade PET hypermetabolism  PFT5/2014  >FEV1 at 0.47 L( 30%), ratio 39 , No BD response (<200cc)  2014 completed Pulm Rehab   12/2015  staph aureus bacteremia  06/2016  right upper lobe community acquired pneumonia  Review of Systems neg for any significant sore throat, dysphagia, itching, sneezing, nasal congestion or excess/ purulent secretions, fever, chills, sweats, unintended wt loss, pleuritic or exertional cp, hempoptysis, orthopnea pnd or change in chronic leg swelling. Also denies presyncope, palpitations, heartburn, abdominal pain, nausea, vomiting,  diarrhea or change in bowel or urinary habits, dysuria,hematuria, rash, arthralgias, visual complaints, headache, numbness weakness or ataxia.     Objective:   Physical Exam  Gen. Pleasant, well-nourished, in no distress ENT - no thrush, no post nasal drip Neck: No JVD, no thyromegaly, no carotid bruits Lungs: no use of accessory muscles, no dullness to percussion, decreased BL without rales or rhonchi  Cardiovascular: Rhythm regular, heart sounds  normal, no murmurs or gallops, no peripheral edema Musculoskeletal: No deformities, no cyanosis or clubbing        Assessment & Plan:

## 2017-02-23 NOTE — Assessment & Plan Note (Signed)
We will ask [facility] to check again on your portable concentrator -3 L continuous at rest and increase to 6 L pulse on walking They may have to reassess on portable concentrator

## 2017-02-23 NOTE — Assessment & Plan Note (Signed)
Stay on [place]

## 2017-02-23 NOTE — Patient Instructions (Signed)
Stay on [place] We will ask [facility] to check again on your portable concentrator  Reschedule your CT chest appointment if necessary

## 2017-03-04 ENCOUNTER — Ambulatory Visit (INDEPENDENT_AMBULATORY_CARE_PROVIDER_SITE_OTHER)
Admission: RE | Admit: 2017-03-04 | Discharge: 2017-03-04 | Disposition: A | Discharge status: Home / Self Care | Payer: Medicare Other | Source: Ambulatory Visit | Attending: Pulmonary Disease | Admitting: Pulmonary Disease

## 2017-03-04 DIAGNOSIS — J9611 Chronic respiratory failure with hypoxia: Secondary | ICD-10-CM

## 2017-03-04 DIAGNOSIS — J449 Chronic obstructive pulmonary disease, unspecified: Secondary | ICD-10-CM | POA: Diagnosis not present

## 2017-03-04 DIAGNOSIS — R911 Solitary pulmonary nodule: Secondary | ICD-10-CM

## 2017-03-05 ENCOUNTER — Other Ambulatory Visit: Payer: Self-pay

## 2017-03-05 DIAGNOSIS — R911 Solitary pulmonary nodule: Secondary | ICD-10-CM

## 2017-03-21 ENCOUNTER — Encounter (HOSPITAL_COMMUNITY): Payer: Self-pay | Admitting: Emergency Medicine

## 2017-03-21 ENCOUNTER — Emergency Department (HOSPITAL_COMMUNITY)
Admission: EM | Admit: 2017-03-21 | Discharge: 2017-03-21 | Disposition: A | Discharge status: Home / Self Care | Payer: Medicare Other | Attending: Emergency Medicine | Admitting: Emergency Medicine

## 2017-03-21 DIAGNOSIS — Z87891 Personal history of nicotine dependence: Secondary | ICD-10-CM | POA: Diagnosis not present

## 2017-03-21 DIAGNOSIS — I110 Hypertensive heart disease with heart failure: Secondary | ICD-10-CM | POA: Insufficient documentation

## 2017-03-21 DIAGNOSIS — Z79899 Other long term (current) drug therapy: Secondary | ICD-10-CM | POA: Insufficient documentation

## 2017-03-21 DIAGNOSIS — E039 Hypothyroidism, unspecified: Secondary | ICD-10-CM | POA: Insufficient documentation

## 2017-03-21 DIAGNOSIS — F10920 Alcohol use, unspecified with intoxication, uncomplicated: Secondary | ICD-10-CM

## 2017-03-21 DIAGNOSIS — Z85118 Personal history of other malignant neoplasm of bronchus and lung: Secondary | ICD-10-CM | POA: Diagnosis not present

## 2017-03-21 DIAGNOSIS — Z7982 Long term (current) use of aspirin: Secondary | ICD-10-CM | POA: Diagnosis not present

## 2017-03-21 DIAGNOSIS — I5032 Chronic diastolic (congestive) heart failure: Secondary | ICD-10-CM | POA: Diagnosis not present

## 2017-03-21 DIAGNOSIS — F10120 Alcohol abuse with intoxication, uncomplicated: Secondary | ICD-10-CM | POA: Diagnosis not present

## 2017-03-21 DIAGNOSIS — F1410 Cocaine abuse, uncomplicated: Secondary | ICD-10-CM | POA: Diagnosis not present

## 2017-03-21 DIAGNOSIS — J449 Chronic obstructive pulmonary disease, unspecified: Secondary | ICD-10-CM | POA: Diagnosis not present

## 2017-03-21 DIAGNOSIS — R29818 Other symptoms and signs involving the nervous system: Secondary | ICD-10-CM | POA: Diagnosis present

## 2017-03-21 LAB — CBC WITH DIFFERENTIAL/PLATELET
Basophils Absolute: 0 10*3/uL (ref 0.0–0.1)
Basophils Relative: 0 %
EOS ABS: 0 10*3/uL (ref 0.0–0.7)
Eosinophils Relative: 0 %
HCT: 43.3 % (ref 36.0–46.0)
HEMOGLOBIN: 14.2 g/dL (ref 12.0–15.0)
LYMPHS ABS: 1 10*3/uL (ref 0.7–4.0)
LYMPHS PCT: 15 %
MCH: 32 pg (ref 26.0–34.0)
MCHC: 32.8 g/dL (ref 30.0–36.0)
MCV: 97.5 fL (ref 78.0–100.0)
Monocytes Absolute: 0.3 10*3/uL (ref 0.1–1.0)
Monocytes Relative: 4 %
NEUTROS PCT: 81 %
Neutro Abs: 5.4 10*3/uL (ref 1.7–7.7)
Platelets: 196 10*3/uL (ref 150–400)
RBC: 4.44 MIL/uL (ref 3.87–5.11)
RDW: 13.8 % (ref 11.5–15.5)
WBC: 6.7 10*3/uL (ref 4.0–10.5)

## 2017-03-21 LAB — HEPATIC FUNCTION PANEL
ALBUMIN: 4.2 g/dL (ref 3.5–5.0)
ALT: 15 U/L (ref 14–54)
AST: 25 U/L (ref 15–41)
Alkaline Phosphatase: 56 U/L (ref 38–126)
Bilirubin, Direct: <0.1 mg/dL — ABNORMAL LOW (ref 0.1–0.5)
TOTAL PROTEIN: 7.8 g/dL (ref 6.5–8.1)
Total Bilirubin: 0.4 mg/dL (ref 0.3–1.2)

## 2017-03-21 LAB — ETHANOL: ALCOHOL ETHYL (B): 90 mg/dL — AB (ref <5)

## 2017-03-21 LAB — RAPID URINE DRUG SCREEN, HOSP PERFORMED
Amphetamines: NOT DETECTED
BARBITURATES: NOT DETECTED
Benzodiazepines: POSITIVE — AB
COCAINE: POSITIVE — AB
Opiates: NOT DETECTED
TETRAHYDROCANNABINOL: NOT DETECTED

## 2017-03-21 LAB — BASIC METABOLIC PANEL
Anion gap: 12 (ref 5–15)
BUN: 12 mg/dL (ref 6–20)
CHLORIDE: 93 mmol/L — AB (ref 101–111)
CO2: 33 mmol/L — ABNORMAL HIGH (ref 22–32)
CREATININE: 0.86 mg/dL (ref 0.44–1.00)
Calcium: 9.2 mg/dL (ref 8.9–10.3)
GFR calc non Af Amer: >60 mL/min (ref >60)
Glucose, Bld: 115 mg/dL — ABNORMAL HIGH (ref 65–99)
POTASSIUM: 3.8 mmol/L (ref 3.5–5.1)
SODIUM: 138 mmol/L (ref 135–145)

## 2017-03-21 LAB — URINALYSIS, ROUTINE W REFLEX MICROSCOPIC
BILIRUBIN URINE: NEGATIVE
Glucose, UA: NEGATIVE mg/dL
HGB URINE DIPSTICK: NEGATIVE
KETONES UR: NEGATIVE mg/dL
Nitrite: NEGATIVE
PH: 5 (ref 5.0–8.0)
Protein, ur: NEGATIVE mg/dL
Specific Gravity, Urine: 1.01 (ref 1.005–1.030)

## 2017-03-21 NOTE — ED Triage Notes (Addendum)
Patient decided to smoke crack about midnight. She knew the person she got it from. She also took a couple of shots of tequila. She first said it was her first time in front of her ex husband but changed the story when EMS got her by herself. Patient keeps having yelling attacks.

## 2017-03-21 NOTE — ED Provider Notes (Addendum)
[facility] DEPT Provider Note   CSN: [id number] Arrival date & time: 03/21/17  0213     History   Chief Complaint Chief Complaint  Patient presents with  . "Screaming attacks"    HPI Jasmine [name] is a 71 y.o. female.  She presents for evaluation of "overdose on crack".  She states she was drinking tequila and smoking crack when she became uncomfortable, and "could not stop screaming."  She states she uses crack about every other day.  She also drinks alcohol frequently.  He denies headache, neck pain chest pain, shortness of breath, weakness or dizziness.  She presents by EMS for evaluation.  There are no other known modifying factors  HPI  Past Medical History:  Diagnosis Date  . ALLERGIC RHINITIS 05/01/2007  . Anxiety   . Arthritis    "hands" (12/28/2015)  . ASTHMA 05/01/2007   "since I was a child"  . Cancer (HCC)    LUNG  . CHF (congestive heart failure) ([place])   . Chronic bronchitis ([place])   . COPD (chronic obstructive pulmonary disease) ([place])   . DEPRESSION 07/21/2008  . DVT (deep venous thrombosis) (HCC) 1960s   LLE  . FATIGUE 10/10/2010  . HYPERTENSION 05/01/2007  . HYPERTHYROIDISM 11/23/2007   Pt endorses having had Graves disease, possibly radioactive iodine x1, but no thyroidectomy  . HYPOTHYROIDISM 08/23/2009  . LUMBAR RADICULOPATHY, RIGHT 08/25/2008  . On home oxygen therapy    "3-4L; qd; all the time" (12/28/2015)  . OSTEOPOROSIS 05/01/2007  . SHOULDER PAIN, LEFT 08/23/2009  . SINUSITIS, CHRONIC 10/10/2010    Patient Active Problem List   Diagnosis Date Noted  . Insect bite 07/12/2016  . Rash 07/12/2016  . Bilateral hearing loss 06/05/2016  . Hyperglycemia 05/21/2016  . Abnormal LFTs 03/24/2016  . Elevated blood sugar 03/24/2016  . Solitary pulmonary nodule 01/09/2016  . Chronic respiratory failure with hypoxia ([place])   . On home oxygen therapy   . Chronic diastolic congestive heart failure ([place])   . Staphylococcus aureus bacteremia 12/28/2015  .  Bacteremia Step viridans 12/27/2015  . Tobacco abuse disorder 12/25/2015  . Hypothyroidism following radioiodine therapy 09/26/2014  . Protein-calorie malnutrition, severe ([place]) 06/23/2014  . Cor pulmonale ([place]) 01/12/2013  . COPD (chronic obstructive pulmonary disease) ([place]) 11/29/2011  . Macrocytosis without anemia 11/09/2011  . SINUSITIS, CHRONIC 10/10/2010  . FATIGUE 10/10/2010  . Depression with anxiety 07/21/2008  . Essential hypertension 05/01/2007  . ALLERGIC RHINITIS 05/01/2007  . ASTHMA 05/01/2007  . OSTEOPOROSIS 05/01/2007    Past Surgical History:  Procedure Laterality Date  . APPENDECTOMY    . DILATION AND CURETTAGE OF UTERUS  multiple   history of multiple dialations and curettages and miscarriages, unfortunately never carrying a child to term  . ECTOPIC PREGNANCY SURGERY  "late '60s or early '70s  . ELECTROCARDIOGRAM  06/20/2006  . OOPHORECTOMY Right     OB History    Gravida Para Term Preterm AB Living   5       5     SAB TAB Ectopic Multiple Live Births   4   1           Home Medications    Prior to Admission medications   Medication Sig Start Date End Date Taking? Authorizing Provider  albuterol (PROAIR HFA) 108 (90 Base) MCG/ACT inhaler Inhale 1-2 puffs into the lungs every 6 (six) hours as needed for wheezing or shortness of breath. 01/19/17   Rigoberto Noel, MD  albuterol (PROVENTIL) (2.5 MG/3ML)  0.083% nebulizer solution Take 3 mLs (2.5 mg total) by nebulization every 4 (four) hours as needed for wheezing. 08/26/16   Biagio Borg, MD  ALPRAZolam Duanne Moron) 0.25 MG tablet Take 1 tablet (0.25 mg total) by mouth 2 (two) times daily as needed for anxiety. 06/20/16   Biagio Borg, MD  aspirin EC 81 MG EC tablet Take 1 tablet (81 mg total) by mouth daily. 03/27/16   Velvet Bathe, MD  citalopram (CELEXA) 20 MG tablet Take 1 tablet (20 mg total) by mouth daily. 08/26/16   Biagio Borg, MD  diphenhydrAMINE-zinc acetate (BENADRYL EXTRA STRENGTH) cream Apply 1  application topically 3 (three) times daily as needed for itching. 07/10/16   Biagio Borg, MD  feeding supplement, ENSURE ENLIVE, (ENSURE ENLIVE) LIQD Take 237 mLs by mouth 3 (three) times daily between meals. 12/31/15   Florencia Reasons, MD  fluticasone furoate-vilanterol (BREO ELLIPTA) 200-25 MCG/INH AEPB Inhale 1 puff into the lungs daily. 08/26/16   Biagio Borg, MD  furosemide (LASIX) 40 MG tablet Take 1 tablet (40 mg total) by mouth 2 (two) times daily. 08/26/16   Biagio Borg, MD  levothyroxine (SYNTHROID, LEVOTHROID) 75 MCG tablet Take 1 tablet (75 mcg total) by mouth daily before breakfast. 08/26/16   Biagio Borg, MD  OXYGEN Inhale 4 L into the lungs continuous.     [provider]  potassium chloride (K-DUR) 10 MEQ tablet Take 1 tablet (10 mEq total) by mouth 2 (two) times daily. 08/26/16   Biagio Borg, MD  tiotropium (SPIRIVA HANDIHALER) 18 MCG inhalation capsule Place 1 capsule (18 mcg total) into inhaler and inhale daily. 08/26/16   Biagio Borg, MD  triamcinolone (KENALOG) 0.025 % cream Apply 1 application topically 3 (three) times daily. As needed for itching 08/18/16   Renato Shin, MD    Family History Family History  Problem Relation Age of Onset  . Lung cancer Father   . Alcohol abuse Brother   . Diabetes Brother   . Hypertension Other   . Stroke Mother   . Thyroid disease Mother   . Asthma Unknown        maternal aunts    Social History Social History  Substance Use Topics  . Smoking status: Former Smoker    Packs/day: 1.00    Years: 54.00    Types: Cigarettes    Quit date: 07/21/2015  . Smokeless tobacco: Never Used  . Alcohol use Yes     Comment: 12/28/2015 "might drink a beer/month"     Allergies   Alendronate sodium   Review of Systems Review of Systems  All other systems reviewed and are negative.    Physical Exam Updated Vital Signs BP 101/65 (BP Location: Right Arm)   Pulse 79   Temp 97.4 F (36.3 C) (Oral)   Resp 16   SpO2 95%    Physical Exam  Constitutional: She is oriented to person, place, and time. She appears well-developed.  Undernourished appearence  HENT:  Head: Normocephalic and atraumatic.  Eyes: Conjunctivae and EOM are normal. Pupils are equal, round, and reactive to light.  Neck: Normal range of motion and phonation normal. Neck supple.  Cardiovascular: Normal rate and regular rhythm.   Pulmonary/Chest: Effort normal and breath sounds normal. She exhibits no tenderness.  Abdominal: Soft. She exhibits no distension. There is no tenderness. There is no guarding.  Musculoskeletal: Normal range of motion.  Neurological: She is alert and oriented to person, place, and time. She  exhibits normal muscle tone.  Lucid  Skin: Skin is warm and dry.  Psychiatric: Her behavior is normal. Judgment and thought content normal.  An[facility]os  Nursing note and vitals reviewed.    ED Treatments / Results  Labs (all labs ordered are listed, but only abnormal results are displayed) Labs Reviewed  BASIC METABOLIC PANEL - Abnormal; Notable for the following:       Result Value   Chloride 93 (*)    CO2 33 (*)    Glucose, Bld 115 (*)    All other components within normal limits  URINALYSIS, ROUTINE W REFLEX MICROSCOPIC - Abnormal; Notable for the following:    Leukocytes, UA MODERATE (*)    Bacteria, UA RARE (*)    Squamous Epithelial / LPF 0-5 (*)    All other components within normal limits  RAPID URINE DRUG SCREEN, HOSP PERFORMED - Abnormal; Notable for the following:    Cocaine POSITIVE (*)    Benzodiazepines POSITIVE (*)    All other components within normal limits  ETHANOL - Abnormal; Notable for the following:    Alcohol, Ethyl (B) 90 (*)    All other components within normal limits  HEPATIC FUNCTION PANEL - Abnormal; Notable for the following:    Bilirubin, Direct <0.1 (*)    All other components within normal limits  CBC WITH DIFFERENTIAL/PLATELET    EKG  EKG Interpretation None        Radiology No results found.  Procedures Procedures (including critical care time)  Medications Ordered in ED Medications - No data to display   Initial Impression / Assessment and Plan / ED Course  I have reviewed the triage vital signs and the nursing notes.  Pertinent labs & imaging results that were available during my care of the patient were reviewed by me and considered in my medical decision making (see chart for details).      No data found.   At discharge- reevaluation with update and discussion. After initial assessment and treatment, an updated evaluation reveals no further complaints, physical exam unchanged.  Findings discussed with patient. Otillia Cordone L   Final Clinical Impressions(s) / ED Diagnoses   Final diagnoses:  Cocaine abuse  Alcoholic intoxication without complication (HCC)    Cocaine and alcohol abuse.  Doubt serious bacterial infection or metabolic instability.  Nursing Notes Reviewed/ Care Coordinated Applicable Imaging Reviewed Interpretation of Laboratory Data incorporated into ED treatment  The patient appears reasonably screened and/or stabilized for discharge and I doubt any other medical condition or other [facility] requiring further screening, evaluation, or treatment in the ED at this time prior to discharge.  Plan: Home Medications-OTC analgesia as needed; Home Treatments-avoid alcohol and cocaine; return here if the recommended treatment, does not improve the symptoms; Recommended follow up-primary care provider as needed.   New Prescriptions Discharge Medication List as of 03/21/2017  6:54 AM       Daleen Bo, MD 03/22/17 8811    Daleen Bo, MD 04/19/17 [phone]

## 2017-03-21 NOTE — ED Triage Notes (Signed)
Patient presents by EMS with complaints of "screaming attacks" after smoking crack tonight and taking tequila shots. EMS states patient's husband called 911 after he came home and his wife was having "screaming attacks".

## 2017-03-21 NOTE — ED Notes (Signed)
Bed: VW86 Expected date:  Expected time:  Means of arrival:  Comments: EMS 71 yo female smoked crack and drank tequila-having "screaming attacks"

## 2017-03-21 NOTE — Discharge Instructions (Signed)
Avoid use of cocaine.  Do not drink alcohol heavily.  Return here if needed, for problems.

## 2017-03-21 NOTE — ED Notes (Signed)
Patient is currently not screaming. She is resting with no pain.

## 2017-03-25 ENCOUNTER — Telehealth: Payer: Self-pay | Admitting: Pulmonary Disease

## 2017-03-25 NOTE — Telephone Encounter (Signed)
Spoke with pt, who states [facility] is needing clarification for order that was placed on 02/23/17. I have spoken with JR with [employer], who states a form will need to be completed for clarification as pt is requesting inogen.   Will route to Cherina to await form

## 2017-03-26 ENCOUNTER — Other Ambulatory Visit: Payer: Self-pay | Admitting: Internal Medicine

## 2017-03-26 NOTE — Telephone Encounter (Signed)
Done hardcopy to Shirron  

## 2017-03-26 NOTE — Telephone Encounter (Signed)
faxed

## 2017-03-27 NOTE — Telephone Encounter (Signed)
Jasmine [name] please advise if you have received forms from [place].  Thanks

## 2017-03-27 NOTE — Telephone Encounter (Signed)
No forms yet

## 2017-03-30 NOTE — Telephone Encounter (Signed)
Checked RA's forms and spoke with Cherina, form has not yet been received.  Called [employer] and spoke with Lorn Junes, who is refaxing POC form to our office.  Will forward to [place] to look out for.

## 2017-03-31 NOTE — Telephone Encounter (Signed)
It does not appear that forms have been received. I have spoken Nita with [name], who states she will refax form. Will route to Cherina to await forms.

## 2017-04-02 NOTE — Telephone Encounter (Signed)
I have checked RA's look at and up front. This form has not been received. I have left a message with [employer] to have this refaxed.

## 2017-04-03 NOTE — Telephone Encounter (Signed)
Will close this encounter until I receive the forms from [place].

## 2017-04-24 DIAGNOSIS — J449 Chronic obstructive pulmonary disease, unspecified: Secondary | ICD-10-CM | POA: Diagnosis not present

## 2017-04-27 ENCOUNTER — Telehealth: Payer: Self-pay | Admitting: Pulmonary Disease

## 2017-04-27 NOTE — Telephone Encounter (Signed)
Spoke with [employer]. Per the patient's chart, her insurance coverage has been terminated. Spoke with patient. She stated that her insurance changed over to [facility] on July 1st. That she no longer has [employer]. Advised patient that she needs to call [employer] and inform them of this since this is what is holding up her order.   Will leave this message open in case anything else is needed.

## 2017-04-29 NOTE — Telephone Encounter (Signed)
Will close this message at this time.

## 2017-04-30 ENCOUNTER — Telehealth: Payer: Self-pay | Admitting: Pulmonary Disease

## 2017-04-30 NOTE — Telephone Encounter (Signed)
Spoke with pt who states she updated her insurance information with [employer]. Asked pt did she need anything else from [place] she stated at this moment no. Nothing further is needed. Nothing further is needed

## 2017-05-04 ENCOUNTER — Telehealth: Payer: Self-pay | Admitting: Internal Medicine

## 2017-05-04 NOTE — Telephone Encounter (Signed)
Pt informed and states Dr. Angus Palms office is stating it needs to come form out office, they have tried to get it for her but [name] wants it to come from [name]. Please advise can this be done for her

## 2017-05-04 NOTE — Telephone Encounter (Signed)
Pt would like a call back. She is trying to get a portable oxygen concentrator and is confused on what to do about this. She does not know if it needs to come from [place] or Dr. Elsworth Soho her pulm doctor.

## 2017-05-04 NOTE — Telephone Encounter (Signed)
Pt must see her pulmonologist for this. Pleae have her to contact Dr. Elsworth Soho office and make appt w/them...Jasmine [name]

## 2017-05-05 DIAGNOSIS — J449 Chronic obstructive pulmonary disease, unspecified: Secondary | ICD-10-CM | POA: Diagnosis not present

## 2017-05-05 NOTE — Telephone Encounter (Signed)
Script has been mailed out to the patient.

## 2017-05-05 NOTE — Telephone Encounter (Signed)
Done hardcopy to Shirron - the order on rx hardcopy

## 2017-05-07 ENCOUNTER — Telehealth: Payer: Self-pay | Admitting: Pulmonary Disease

## 2017-05-07 NOTE — Telephone Encounter (Signed)
Spoke to [email or web address]  and she states pt changed ins and that [facility] needs a order for pos to keep sats above 90% so she is faxing me a form and I will have dr Elsworth Soho to sign it and I will fax it back to them today or tomorrow am and then pt should be good and [facility] will call her [place]ellen [place]

## 2017-05-07 NOTE — Telephone Encounter (Signed)
Spoke to pt she is aware of this process also received the form and it has been faxed back to [place]

## 2017-05-12 ENCOUNTER — Telehealth: Payer: Self-pay | Admitting: Pulmonary Disease

## 2017-05-12 NOTE — Telephone Encounter (Signed)
Pt states her insurance changed to [employer] in July.  She hasn't been since here since June.   She has already given new insurance info to [place].  She will bring her new cards to [place] when she comes for her ov in August to see TP.  I told her to make sure she gives new insurance info to all her providers.  Nothing further needed.

## 2017-05-21 ENCOUNTER — Telehealth: Payer: Self-pay | Admitting: Adult Health

## 2017-05-21 NOTE — Telephone Encounter (Signed)
Attempted to contact pt. Received a fast busy signal x2. Will try back.  

## 2017-05-22 NOTE — Telephone Encounter (Signed)
Attempted to contact pt. No answer, no option to leave a message. Will try back.  

## 2017-05-22 NOTE — Telephone Encounter (Signed)
Pt returning call and can be reached [phone].Jasmine [name]

## 2017-05-25 DIAGNOSIS — J449 Chronic obstructive pulmonary disease, unspecified: Secondary | ICD-10-CM | POA: Diagnosis not present

## 2017-05-25 NOTE — Telephone Encounter (Signed)
Spoke with pt. States that she was given a POC from her DME. Reports that she is having some issues with it. Advised her she would need to contact the DME and have them help her with the issues. Nothing further was needed.

## 2017-05-26 ENCOUNTER — Ambulatory Visit: Payer: TRICARE For Life (TFL) | Admitting: Adult Health

## 2017-05-29 ENCOUNTER — Ambulatory Visit: Payer: Medicare Other | Admitting: Internal Medicine

## 2017-06-03 ENCOUNTER — Ambulatory Visit (INDEPENDENT_AMBULATORY_CARE_PROVIDER_SITE_OTHER): Payer: Medicare HMO | Admitting: Internal Medicine

## 2017-06-03 ENCOUNTER — Encounter: Payer: Self-pay | Admitting: Internal Medicine

## 2017-06-03 ENCOUNTER — Ambulatory Visit (INDEPENDENT_AMBULATORY_CARE_PROVIDER_SITE_OTHER)
Admission: RE | Admit: 2017-06-03 | Discharge: 2017-06-03 | Disposition: A | Discharge status: Home / Self Care | Payer: Medicare HMO | Source: Ambulatory Visit | Attending: Internal Medicine | Admitting: Internal Medicine

## 2017-06-03 ENCOUNTER — Other Ambulatory Visit (INDEPENDENT_AMBULATORY_CARE_PROVIDER_SITE_OTHER): Payer: Medicare HMO

## 2017-06-03 VITALS — BP 136/78 | HR 112 | Temp 98.3°F | Ht 60.0 in | Wt 108.0 lb

## 2017-06-03 DIAGNOSIS — E890 Postprocedural hypothyroidism: Secondary | ICD-10-CM

## 2017-06-03 DIAGNOSIS — R739 Hyperglycemia, unspecified: Secondary | ICD-10-CM

## 2017-06-03 DIAGNOSIS — R21 Rash and other nonspecific skin eruption: Secondary | ICD-10-CM | POA: Diagnosis not present

## 2017-06-03 DIAGNOSIS — J9611 Chronic respiratory failure with hypoxia: Secondary | ICD-10-CM

## 2017-06-03 DIAGNOSIS — T17908A Unspecified foreign body in respiratory tract, part unspecified causing other injury, initial encounter: Secondary | ICD-10-CM

## 2017-06-03 DIAGNOSIS — R0602 Shortness of breath: Secondary | ICD-10-CM | POA: Diagnosis not present

## 2017-06-03 DIAGNOSIS — I5032 Chronic diastolic (congestive) heart failure: Secondary | ICD-10-CM

## 2017-06-03 LAB — BASIC METABOLIC PANEL
BUN: 11 mg/dL (ref 6–23)
CALCIUM: 9.7 mg/dL (ref 8.4–10.5)
CO2: 43 mEq/L — ABNORMAL HIGH (ref 19–32)
CREATININE: 0.81 mg/dL (ref 0.40–1.20)
Chloride: 89 mEq/L — ABNORMAL LOW (ref 96–112)
GFR: 89.24 mL/min (ref >60.00)
GLUCOSE: 137 mg/dL — AB (ref 70–99)
Potassium: 3.8 mEq/L (ref 3.5–5.1)
Sodium: 138 mEq/L (ref 135–145)

## 2017-06-03 LAB — CBC WITH DIFFERENTIAL/PLATELET
BASOS ABS: 0.1 10*3/uL (ref 0.0–0.1)
Basophils Relative: 1.2 % (ref 0.0–3.0)
EOS PCT: 3.4 % (ref 0.0–5.0)
Eosinophils Absolute: 0.2 10*3/uL (ref 0.0–0.7)
HEMATOCRIT: 44.2 % (ref 36.0–46.0)
HEMOGLOBIN: 14.2 g/dL (ref 12.0–15.0)
LYMPHS ABS: 1.6 10*3/uL (ref 0.7–4.0)
LYMPHS PCT: 27.9 % (ref 12.0–46.0)
MCHC: 32 g/dL (ref 30.0–36.0)
MCV: 98.3 fl (ref 78.0–100.0)
MONOS PCT: 9.4 % (ref 3.0–12.0)
Monocytes Absolute: 0.5 10*3/uL (ref 0.1–1.0)
Neutro Abs: 3.3 10*3/uL (ref 1.4–7.7)
Neutrophils Relative %: 58.1 % (ref 43.0–77.0)
Platelets: 181 10*3/uL (ref 150.0–400.0)
RBC: 4.5 Mil/uL (ref 3.87–5.11)
RDW: 13.7 % (ref 11.5–15.5)
WBC: 5.7 10*3/uL (ref 4.0–10.5)

## 2017-06-03 LAB — TSH: TSH: 1.85 u[IU]/mL (ref 0.35–4.50)

## 2017-06-03 LAB — HEPATIC FUNCTION PANEL
ALBUMIN: 4.3 g/dL (ref 3.5–5.2)
ALK PHOS: 62 U/L (ref 39–117)
ALT: 15 U/L (ref 0–35)
AST: 25 U/L (ref 0–37)
Bilirubin, Direct: 0.2 mg/dL (ref 0.0–0.3)
TOTAL PROTEIN: 7.6 g/dL (ref 6.0–8.3)
Total Bilirubin: 0.4 mg/dL (ref 0.2–1.2)

## 2017-06-03 LAB — LIPID PANEL
CHOL/HDL RATIO: 2
Cholesterol: 223 mg/dL — ABNORMAL HIGH (ref 0–200)
HDL: 104.1 mg/dL (ref >39.00)
LDL CALC: 109 mg/dL — AB (ref 0–99)
NONHDL: 119.02
Triglycerides: 50 mg/dL (ref 0.0–149.0)
VLDL: 10 mg/dL (ref 0.0–40.0)

## 2017-06-03 LAB — HEMOGLOBIN A1C: Hgb A1c MFr Bld: 6.3 % (ref 4.6–6.5)

## 2017-06-03 LAB — T4, FREE: Free T4: 0.92 ng/dL (ref 0.60–1.60)

## 2017-06-03 MED ORDER — TRIAMCINOLONE ACETONIDE 0.025 % EX CREA: 1.0000 "application " | TOPICAL_CREAM | Freq: Three times a day (TID) | CUTANEOUS | 3 refills | Status: DC

## 2017-06-03 MED ORDER — FUROSEMIDE 40 MG PO TABS: 40.0000 mg | ORAL_TABLET | Freq: Two times a day (BID) | ORAL | 3 refills | Status: DC

## 2017-06-03 MED ORDER — FLUTICASONE FUROATE-VILANTEROL 200-25 MCG/INH IN AEPB: 1.0000 | INHALATION_SPRAY | Freq: Every day | RESPIRATORY_TRACT | 5 refills | Status: DC

## 2017-06-03 MED ORDER — POTASSIUM CHLORIDE ER 10 MEQ PO TBCR: 10.0000 meq | EXTENDED_RELEASE_TABLET | Freq: Two times a day (BID) | ORAL | 3 refills | Status: DC

## 2017-06-03 MED ORDER — ALBUTEROL SULFATE HFA 108 (90 BASE) MCG/ACT IN AERS: 1.0000 | INHALATION_SPRAY | Freq: Four times a day (QID) | RESPIRATORY_TRACT | 5 refills | Status: DC | PRN

## 2017-06-03 MED ORDER — LEVOTHYROXINE SODIUM 75 MCG PO TABS: 75.0000 ug | ORAL_TABLET | Freq: Every day | ORAL | 3 refills | Status: DC

## 2017-06-03 MED ORDER — TIOTROPIUM BROMIDE MONOHYDRATE 18 MCG IN CAPS: 18.0000 ug | ORAL_CAPSULE | Freq: Every day | RESPIRATORY_TRACT | 3 refills | Status: DC

## 2017-06-03 MED ORDER — CITALOPRAM HYDROBROMIDE 20 MG PO TABS: 20.0000 mg | ORAL_TABLET | Freq: Every day | ORAL | 3 refills | Status: DC

## 2017-06-03 MED ORDER — ALBUTEROL SULFATE (2.5 MG/3ML) 0.083% IN NEBU: 2.5000 mg | INHALATION_SOLUTION | RESPIRATORY_TRACT | 12 refills | Status: DC | PRN

## 2017-06-03 MED ORDER — LEVOFLOXACIN 250 MG PO TABS: 250.0000 mg | ORAL_TABLET | Freq: Every day | ORAL | 0 refills | Status: AC

## 2017-06-03 MED ORDER — ALPRAZOLAM 0.25 MG PO TABS: 0.2500 mg | ORAL_TABLET | Freq: Two times a day (BID) | ORAL | 3 refills | Status: DC

## 2017-06-03 NOTE — Progress Notes (Signed)
Subjective:    Patient ID: Jasmine [name], female    DOB: [birth year], 71 y.o.   MRN: [id number]  HPI  Here to f/u with hx of chronic resp failure on 4L [place] at home, now with onset 1 wk scant prod cough and feeling poorly, with mild to mod worsening sob and doe, assoc with exertional fatigue and lack of energy.  Pt denies chest pain, wheezing, orthopnea, PND, increased LE swelling, palpitations, dizziness or syncope. Today present with initial o2 sat 65-70% (on arrival with ambulation to the nursing station with left index finger), when she is normally in the high 80's per pt. Follow up o2 sat on the right index finger was 98% on 4L at rest (with 83% on left index finger at rest on 4L ),  She (and we) are concerned about aspiration as she thinks this may be happening since she had a realized frank episode just last night of a small piece of corn that she is not sure actually came out after aspiration.  Also has recurrence of a dry somewhat scaly rash has recurred to bilat volar post arms, and she is out of triam cream which helped in the past  Depsite computer notes, pt states she does not need THN.  Denies hyper or hypo thyroid symptoms such as voice, skin or hair change.  Pt denies polydipsia, polyuria Past Medical History:  Diagnosis Date  . ALLERGIC RHINITIS 05/01/2007  . Anxiety   . Arthritis    "hands" (12/28/2015)  . ASTHMA 05/01/2007   "since I was a child"  . Cancer (HCC)    LUNG  . CHF (congestive heart failure) ([place])   . Chronic bronchitis ([place])   . COPD (chronic obstructive pulmonary disease) ([place])   . DEPRESSION 07/21/2008  . DVT (deep venous thrombosis) (HCC) 1960s   LLE  . FATIGUE 10/10/2010  . HYPERTENSION 05/01/2007  . HYPERTHYROIDISM 11/23/2007   Pt endorses having had Graves disease, possibly radioactive iodine x1, but no thyroidectomy  . HYPOTHYROIDISM 08/23/2009  . LUMBAR RADICULOPATHY, RIGHT 08/25/2008  . On home oxygen therapy    "3-4L; qd; all the time" (12/28/2015)  .  OSTEOPOROSIS 05/01/2007  . SHOULDER PAIN, LEFT 08/23/2009  . SINUSITIS, CHRONIC 10/10/2010   Past Surgical History:  Procedure Laterality Date  . APPENDECTOMY    . DILATION AND CURETTAGE OF UTERUS  multiple   history of multiple dialations and curettages and miscarriages, unfortunately never carrying a child to term  . ECTOPIC PREGNANCY SURGERY  "late '60s or early '70s  . ELECTROCARDIOGRAM  06/20/2006  . OOPHORECTOMY Right     reports that she quit smoking about 22 months ago. Her smoking use included Cigarettes. She has a 54.00 pack-year smoking history. She has never used smokeless tobacco. She reports that she drinks alcohol. She reports that she does not use drugs. family history includes Alcohol abuse in her brother; Asthma in her unknown relative; Diabetes in her brother; Hypertension in her other; Lung cancer in her father; Stroke in her mother; Thyroid disease in her mother. Allergies  Allergen Reactions  . Alendronate Sodium Other (See Comments)    Pt does not remember this reaction   Current Outpatient Prescriptions on File Prior to Visit  Medication Sig Dispense Refill  . aspirin EC 81 MG EC tablet Take 1 tablet (81 mg total) by mouth daily. 30 tablet 0  . diphenhydrAMINE-zinc acetate (BENADRYL EXTRA STRENGTH) cream Apply 1 application topically 3 (three) times daily as needed for itching. 28.4  g 0  . feeding supplement, ENSURE ENLIVE, (ENSURE ENLIVE) LIQD Take 237 mLs by mouth 3 (three) times daily between meals. 237 mL 12  . OXYGEN Inhale 4 L into the lungs continuous.      No current facility-administered medications on file prior to visit.    Review of Systems  Constitutional: Negative for other unusual diaphoresis or sweats HENT: Negative for ear discharge or swelling Eyes: Negative for other worsening visual disturbances Respiratory: Negative for stridor or other swelling  Gastrointestinal: Negative for worsening distension or other blood Genitourinary: Negative for  retention or other urinary change Musculoskeletal: Negative for other MSK pain or swelling Skin: Negative for color change or other new lesions Neurological: Negative for worsening tremors and other numbness  Psychiatric/Behavioral: Negative for worsening agitation or other fatigue All other system neg per pt    Objective:   Physical Exam BP 136/78   Pulse (!) 112   Temp 98.3 F (36.8 C)   Ht 5' (1.524 m)   Wt 108 lb (49 kg)   SpO2 (!) 70%   BMI 21.09 kg/m  VS noted,  Constitutional: Pt appears in NAD HENT: Head: NCAT.  Right Ear: External ear normal.  Left Ear: External ear normal.  Eyes: . Pupils are equal, round, and reactive to light. Conjunctivae and EOM are normal Nose: without d/c or deformity Neck: Neck supple. Gross normal ROM Cardiovascular: Normal rate and regular rhythm.   Pulmonary/Chest: Effort normal and breath sounds decreased without rales or wheezing.  Neurological: Pt is alert. At baseline orientation, motor grossly intact Skin: Skin is warm. No rashes, other new lesions, no LE edema Psychiatric: Pt behavior is normal without agitation  No other exam findings    Assessment & Plan:

## 2017-06-03 NOTE — Patient Instructions (Addendum)
Please take all new medication as prescribed - the antibiotic  Your oxygen should be checked from now on with the index finger right hand for accurate readings  Please continue all other medications as before, and refills have been done if requested.  Please have the pharmacy call with any other refills you may need.  Please continue your efforts at being more active, low cholesterol diet, and weight control.  Please keep your appointments with your specialists as you may have planned  Please go to the XRAY Department in the Basement (go straight as you get off the elevator) for the x-ray testing  Please go to the LAB in the Basement (turn left off the elevator) for the tests to be done today  You will be contacted by phone if any changes need to be made immediately.  Otherwise, you will receive a letter about your results with an explanation, but please check with MyChart first.  Please remember to sign up for MyChart if you have not done so, as this will be important to you in the future with finding out test results, communicating by private email, and scheduling acute appointments online when needed.  Please return in 3 months, or sooner if needed

## 2017-06-04 ENCOUNTER — Encounter: Payer: Self-pay | Admitting: Internal Medicine

## 2017-06-05 DIAGNOSIS — R911 Solitary pulmonary nodule: Secondary | ICD-10-CM | POA: Diagnosis not present

## 2017-06-05 DIAGNOSIS — R300 Dysuria: Secondary | ICD-10-CM | POA: Diagnosis not present

## 2017-06-05 DIAGNOSIS — J9611 Chronic respiratory failure with hypoxia: Secondary | ICD-10-CM | POA: Diagnosis not present

## 2017-06-05 DIAGNOSIS — J449 Chronic obstructive pulmonary disease, unspecified: Secondary | ICD-10-CM | POA: Diagnosis not present

## 2017-06-06 NOTE — Assessment & Plan Note (Addendum)
Declines ST evaluation but + hx recent small episode accidental and rare per pt; for cxr but no evidence for pna at this time, for antibx pending xray results  \Note:  Total time for pt hx, exam, review of record with pt in the room, determination of diagnoses and plan for further eval and tx is > 40 min, with over 50% spent in coordination and counseling of patient including the differential dx, further tx and evaluation and other management of possible aspirate pna, chronic resp failure, dCHF, rash, hypothyroid and hyperglycemia

## 2017-06-06 NOTE — Assessment & Plan Note (Signed)
With correct o2 sat monitor technique it is felt pt is stable,  to f/u any worsening symptoms or concerns

## 2017-06-06 NOTE — Assessment & Plan Note (Signed)
stable overall by history and exam, recent data reviewed with pt, and pt to continue medical treatment as before,  to f/u any worsening symptoms or concerns Lab Results  Component Value Date   TSH 1.85 06/03/2017

## 2017-06-06 NOTE — Assessment & Plan Note (Signed)
Post volar surface bilat, c/w dermatitis, for steroid cream prn,  to f/u any worsening symptoms or concerns

## 2017-06-06 NOTE — Assessment & Plan Note (Signed)
Lab Results  Component Value Date   HGBA1C 6.3 06/03/2017  stable overall by history and exam, recent data reviewed with pt, and pt to continue medical treatment as before,  to f/u any worsening symptoms or concerns

## 2017-06-06 NOTE — Assessment & Plan Note (Signed)
stable overall by history and exam, euvolemic by exam, and pt to continue medical treatment as before,  to f/u any worsening symptoms or concern

## 2017-06-16 ENCOUNTER — Ambulatory Visit (INDEPENDENT_AMBULATORY_CARE_PROVIDER_SITE_OTHER): Payer: Medicare HMO | Admitting: Adult Health

## 2017-06-16 ENCOUNTER — Encounter: Payer: Self-pay | Admitting: Adult Health

## 2017-06-16 DIAGNOSIS — J9611 Chronic respiratory failure with hypoxia: Secondary | ICD-10-CM

## 2017-06-16 DIAGNOSIS — J449 Chronic obstructive pulmonary disease, unspecified: Secondary | ICD-10-CM | POA: Diagnosis not present

## 2017-06-16 MED ORDER — FLUTICASONE FUROATE-VILANTEROL 200-25 MCG/INH IN AEPB
1.0000 | INHALATION_SPRAY | Freq: Every day | RESPIRATORY_TRACT | 0 refills | Status: DC
Start: 2017-06-16 — End: 2017-08-31

## 2017-06-16 NOTE — Assessment & Plan Note (Signed)
Cont on o2 .  

## 2017-06-16 NOTE — Assessment & Plan Note (Signed)
Recent flare with bronchitis now resolving  CXR w/ no acute changes  Encouraged on cocaine cessation   Plan  Patient Instructions  Continue on Spiriva 1 puff daily  Continue on BREO daily  Rinse after use. Continue on Spiriva 1 puff daily  Must stop all drug use.  Continue on Oxygen 3l-4 l/m    Follow up for CT chest in November as planned.  Follow up Dr. Elsworth Soho  In 3 months and  As needed

## 2017-06-16 NOTE — Patient Instructions (Signed)
Continue on Spiriva 1 puff daily  Continue on BREO daily  Rinse after use. Continue on Spiriva 1 puff daily  Must stop all drug use.  Continue on Oxygen 3l-4 l/m    Follow up for CT chest in November as planned.  Follow up Dr. Elsworth Soho  In 3 months and  As needed

## 2017-06-16 NOTE — Progress Notes (Signed)
@Patient  ID: Jasmine [name], female    DOB: June 28, 1946, 71 y.o.   MRN: [id number]  Chief Complaint  Patient presents with  . Follow-up    COPD     Referring provider: Biagio Borg, MD  HPI: [id number].o smoker for FU of COPD .Seen for initial pulmonary eval 02/03/13  She was started on O2 in march 2014 &uses 33 liters 02 continuously and w/ sleep, and 4L pulsed with activity.  She has smoked Since her 21's - 35 Pyrs. Quit 07/21/2015.History of cocaine use in recent past. Husband passed away 01/10/2016 while she was hospitalized.  TEST CT chest 10/2011 >>showed Moderate centrilobular emphysema. Small bilateral pleural effusions with dependent atelectasis  CT chest 07/2016-resolving right upper lobe consolidation,spiculated subcentimeter nodules that have been noted before with low-grade PET hypermetabolism  PFT >FEV1 at 0.47 L( 30%), ratio 39 , No BD response (<200cc)  2014 completed Pulm Rehab   06/16/2017 Follow up : COPD /Lung nodules  Patient returns for three-month follow-up. She has known history of COPD. She remains on BREO and Spiriva. She was treated for bronchitis 2 weeks ago w/ levaquin. She is starting to feel better. CXR on 06/03/17 showed stable COPD changes .  Pneumovax and Prevnar vaccines are up-to-date.  Went into ER in June for overdose of "crack cocaine" and etoh. ER notes reviewed. Notes indicated she does cocaine every other day.   Patient is on oxygen 3-4 L continuous flow.  Patient has known pulmonary nodules followed on serial, CT chest. Most recent CT May 2018 showed decrease in a 2 cm right upper lobe pulmonary nodule. A new 4 mm left upper lobe nodule. Unchanged scattered bilateral pulmonary nodules. She has a upcoming CT chest in November 2018.   Allergies  Allergen Reactions  . Alendronate Sodium Other (See Comments)    Pt does not remember this reaction    Immunization History  Administered Date(s) Administered  . Influenza Split 07/10/2011,  08/18/2012  . Influenza Whole 07/21/2008, 08/23/2009, 10/10/2010  . Influenza, High Dose Seasonal PF 07/10/2016  . Influenza,inj,Quad PF,6+ Mos 08/12/2013, 07/10/2014  . Pneumococcal Conjugate-13 11/30/2014  . Pneumococcal Polysaccharide-23 07/21/2007, 01/12/2013  . Td 08/23/2009  . Zoster 07/20/2006    Past Medical History:  Diagnosis Date  . ALLERGIC RHINITIS 05/01/2007  . Anxiety   . Arthritis    "hands" (12/28/2015)  . ASTHMA 05/01/2007   "since I was a child"  . Cancer (HCC)    LUNG  . CHF (congestive heart failure) ([place])   . Chronic bronchitis ([place])   . COPD (chronic obstructive pulmonary disease) ([place])   . DEPRESSION 07/21/2008  . DVT (deep venous thrombosis) (HCC) 1960s   LLE  . FATIGUE 10/10/2010  . HYPERTENSION 05/01/2007  . HYPERTHYROIDISM 11/23/2007   Pt endorses having had Graves disease, possibly radioactive iodine x1, but no thyroidectomy  . HYPOTHYROIDISM 08/23/2009  . LUMBAR RADICULOPATHY, RIGHT 08/25/2008  . On home oxygen therapy    "3-4L; qd; all the time" (12/28/2015)  . OSTEOPOROSIS 05/01/2007  . SHOULDER PAIN, LEFT 08/23/2009  . SINUSITIS, CHRONIC 10/10/2010    Tobacco History: History  Smoking Status  . Former Smoker  . Packs/day: 1.00  . Years: 54.00  . Types: Cigarettes  . Quit date: 07/21/2015  Smokeless Tobacco  . Never Used   Counseling given: Not Answered   Outpatient Encounter Prescriptions as of 06/16/2017  Medication Sig  . albuterol (PROAIR HFA) 108 (90 Base) MCG/ACT inhaler Inhale 1-2 puffs into the lungs every 6 (  six) hours as needed for wheezing or shortness of breath.  [name] albuterol (PROVENTIL) (2.5 MG/3ML) 0.083% nebulizer solution Take 3 mLs (2.5 mg total) by nebulization every 4 (four) hours as needed for wheezing.  [name] ALPRAZolam (XANAX) 0.25 MG tablet Take 1 tablet (0.25 mg total) by mouth 2 (two) times daily.  . citalopram (CELEXA) 20 MG tablet Take 1 tablet (20 mg total) by mouth daily.  . diphenhydrAMINE-zinc acetate (BENADRYL  EXTRA STRENGTH) cream Apply 1 application topically 3 (three) times daily as needed for itching.  . feeding supplement, ENSURE ENLIVE, (ENSURE ENLIVE) LIQD Take 237 mLs by mouth 3 (three) times daily between meals.  . fluticasone furoate-vilanterol (BREO ELLIPTA) 200-25 MCG/INH AEPB Inhale 1 puff into the lungs daily.  . furosemide (LASIX) 40 MG tablet Take 1 tablet (40 mg total) by mouth 2 (two) times daily.  [name] levothyroxine (SYNTHROID, LEVOTHROID) 75 MCG tablet Take 1 tablet (75 mcg total) by mouth daily before breakfast.  . OXYGEN Inhale 4 L into the lungs continuous.   . potassium chloride (K-DUR) 10 MEQ tablet Take 1 tablet (10 mEq total) by mouth 2 (two) times daily.  [name] tiotropium (SPIRIVA HANDIHALER) 18 MCG inhalation capsule Place 1 capsule (18 mcg total) into inhaler and inhale daily.  [name] triamcinolone (KENALOG) 0.025 % cream Apply 1 application topically 3 (three) times daily. As needed for itching  . aspirin EC 81 MG EC tablet Take 1 tablet (81 mg total) by mouth daily. (Patient not taking: Reported on 06/16/2017)   No facility-administered encounter medications on file as of 06/16/2017.      Review of Systems  Constitutional:   No  weight loss, night sweats,  Fevers, chills,  +fatigue, or  lassitude.  HEENT:   No headaches,  Difficulty swallowing,  Tooth/dental problems, or  Sore throat,                No sneezing, itching, ear ache, nasal congestion, post nasal drip,   CV:  No chest pain,  Orthopnea, PND, swelling in lower extremities, anasarca, dizziness, palpitations, syncope.   GI  No heartburn, indigestion, abdominal pain, nausea, vomiting, diarrhea, change in bowel habits, loss of appetite, bloody stools.   Resp:    No chest wall deformity  Skin: no rash or lesions.  GU: no dysuria, change in color of urine, no urgency or frequency.  No flank pain, no hematuria   MS:  No joint pain or swelling.  No decreased range of motion.  No back pain.    Physical Exam  There  were no vitals taken for this visit.  GEN: A/Ox3; pleasant , NAD, frail and elderly on O2    HEENT:  [place]/AT,  EACs-clear, TMs-wnl, NOSE-clear, THROAT-clear, no lesions, no postnasal drip or exudate noted.   NECK:  Supple w/ fair ROM; no JVD; normal carotid impulses w/o bruits; no thyromegaly or nodules palpated; no lymphadenopathy.    RESP  Diminished BS in bases  no accessory muscle use, no dullness to percussion  CARD:  RRR, no m/r/g, no peripheral edema, pulses intact, no cyanosis or clubbing.  GI:   Soft & nt; nml bowel sounds; no organomegaly or masses detected.   Musco: Warm bil, no deformities or joint swelling noted.   Neuro: alert, no focal deficits noted.    Skin: Warm, no lesions or rashes    Lab Results:  CBC  BNP  Imaging: Dg Chest 2 View  Result Date: 06/03/2017 CLINICAL DATA:  Increase shortness of breath. EXAM: CHEST  2 VIEW COMPARISON:  CT chest 03/05/2015 and chest radiograph 05/21/2016. FINDINGS: Trachea is midline. Heart size normal. Thoracic aorta is calcified. Scarring in the posterior segment right upper lobe. Lungs are hyperinflated but otherwise clear. No pleural fluid. IMPRESSION: Hyperinflation without acute finding. Electronically Signed   By: Lorin Picket M.D.   On: 06/03/2017 16:46     Assessment & Plan:   COPD (chronic obstructive pulmonary disease) (HCC) Recent flare with bronchitis now resolving  CXR w/ no acute changes  Encouraged on cocaine cessation   Plan  Patient Instructions  Continue on Spiriva 1 puff daily  Continue on BREO daily  Rinse after use. Continue on Spiriva 1 puff daily  Must stop all drug use.  Continue on Oxygen 3l-4 l/m    Follow up for CT chest in November as planned.  Follow up Dr. Elsworth Soho  In 3 months and  As needed                Chronic respiratory failure with hypoxia ([place]) Cont on o2 .      Rexene Edison, NP 06/16/2017

## 2017-06-17 ENCOUNTER — Telehealth: Payer: Self-pay | Admitting: Internal Medicine

## 2017-06-17 DIAGNOSIS — J449 Chronic obstructive pulmonary disease, unspecified: Secondary | ICD-10-CM

## 2017-06-17 NOTE — Telephone Encounter (Signed)
Patient has been informed.   She stated she had spoke with someone named Brooke at [facility] about this. She could not remember that information earlier.

## 2017-06-17 NOTE — Telephone Encounter (Signed)
Referral done

## 2017-06-17 NOTE — Telephone Encounter (Signed)
Patient is requesting a referral to home health to be able to get home health nursing help since she has been diagnosis with COPD. Please advise if patient needs an appointment. She last here on 06/03/17. Thank you.

## 2017-06-18 NOTE — Progress Notes (Signed)
Reviewed & agree with plan  

## 2017-06-24 DIAGNOSIS — F329 Major depressive disorder, single episode, unspecified: Secondary | ICD-10-CM | POA: Diagnosis not present

## 2017-06-24 DIAGNOSIS — J9611 Chronic respiratory failure with hypoxia: Secondary | ICD-10-CM | POA: Diagnosis not present

## 2017-06-24 DIAGNOSIS — E039 Hypothyroidism, unspecified: Secondary | ICD-10-CM | POA: Diagnosis not present

## 2017-06-24 DIAGNOSIS — M810 Age-related osteoporosis without current pathological fracture: Secondary | ICD-10-CM | POA: Diagnosis not present

## 2017-06-24 DIAGNOSIS — J432 Centrilobular emphysema: Secondary | ICD-10-CM | POA: Diagnosis not present

## 2017-06-24 DIAGNOSIS — I110 Hypertensive heart disease with heart failure: Secondary | ICD-10-CM | POA: Diagnosis not present

## 2017-06-24 DIAGNOSIS — F419 Anxiety disorder, unspecified: Secondary | ICD-10-CM | POA: Diagnosis not present

## 2017-06-24 DIAGNOSIS — I509 Heart failure, unspecified: Secondary | ICD-10-CM | POA: Diagnosis not present

## 2017-06-24 DIAGNOSIS — Z9981 Dependence on supplemental oxygen: Secondary | ICD-10-CM | POA: Diagnosis not present

## 2017-06-25 ENCOUNTER — Telehealth: Payer: Self-pay | Admitting: Internal Medicine

## 2017-06-25 DIAGNOSIS — J449 Chronic obstructive pulmonary disease, unspecified: Secondary | ICD-10-CM | POA: Diagnosis not present

## 2017-06-25 NOTE — Telephone Encounter (Signed)
Ok for verbals 

## 2017-06-25 NOTE — Telephone Encounter (Signed)
Called Laura no answer LMOM w/MD response../lmb 

## 2017-06-25 NOTE — Telephone Encounter (Signed)
Requesting PT orders for twice a week for four weeks.

## 2017-07-02 DIAGNOSIS — J9611 Chronic respiratory failure with hypoxia: Secondary | ICD-10-CM | POA: Diagnosis not present

## 2017-07-02 DIAGNOSIS — I110 Hypertensive heart disease with heart failure: Secondary | ICD-10-CM | POA: Diagnosis not present

## 2017-07-02 DIAGNOSIS — F329 Major depressive disorder, single episode, unspecified: Secondary | ICD-10-CM | POA: Diagnosis not present

## 2017-07-02 DIAGNOSIS — F419 Anxiety disorder, unspecified: Secondary | ICD-10-CM | POA: Diagnosis not present

## 2017-07-02 DIAGNOSIS — Z9981 Dependence on supplemental oxygen: Secondary | ICD-10-CM | POA: Diagnosis not present

## 2017-07-02 DIAGNOSIS — I509 Heart failure, unspecified: Secondary | ICD-10-CM | POA: Diagnosis not present

## 2017-07-02 DIAGNOSIS — J432 Centrilobular emphysema: Secondary | ICD-10-CM | POA: Diagnosis not present

## 2017-07-02 DIAGNOSIS — E039 Hypothyroidism, unspecified: Secondary | ICD-10-CM | POA: Diagnosis not present

## 2017-07-02 DIAGNOSIS — M810 Age-related osteoporosis without current pathological fracture: Secondary | ICD-10-CM | POA: Diagnosis not present

## 2017-07-06 DIAGNOSIS — J9611 Chronic respiratory failure with hypoxia: Secondary | ICD-10-CM | POA: Diagnosis not present

## 2017-07-06 DIAGNOSIS — J449 Chronic obstructive pulmonary disease, unspecified: Secondary | ICD-10-CM | POA: Diagnosis not present

## 2017-07-06 DIAGNOSIS — R911 Solitary pulmonary nodule: Secondary | ICD-10-CM | POA: Diagnosis not present

## 2017-07-06 DIAGNOSIS — R300 Dysuria: Secondary | ICD-10-CM | POA: Diagnosis not present

## 2017-07-07 DIAGNOSIS — J9611 Chronic respiratory failure with hypoxia: Secondary | ICD-10-CM | POA: Diagnosis not present

## 2017-07-07 DIAGNOSIS — J432 Centrilobular emphysema: Secondary | ICD-10-CM | POA: Diagnosis not present

## 2017-07-07 DIAGNOSIS — F419 Anxiety disorder, unspecified: Secondary | ICD-10-CM | POA: Diagnosis not present

## 2017-07-07 DIAGNOSIS — I110 Hypertensive heart disease with heart failure: Secondary | ICD-10-CM | POA: Diagnosis not present

## 2017-07-07 DIAGNOSIS — M810 Age-related osteoporosis without current pathological fracture: Secondary | ICD-10-CM | POA: Diagnosis not present

## 2017-07-07 DIAGNOSIS — E039 Hypothyroidism, unspecified: Secondary | ICD-10-CM | POA: Diagnosis not present

## 2017-07-07 DIAGNOSIS — I509 Heart failure, unspecified: Secondary | ICD-10-CM | POA: Diagnosis not present

## 2017-07-07 DIAGNOSIS — Z9981 Dependence on supplemental oxygen: Secondary | ICD-10-CM | POA: Diagnosis not present

## 2017-07-07 DIAGNOSIS — F329 Major depressive disorder, single episode, unspecified: Secondary | ICD-10-CM | POA: Diagnosis not present

## 2017-07-16 DIAGNOSIS — E039 Hypothyroidism, unspecified: Secondary | ICD-10-CM | POA: Diagnosis not present

## 2017-07-16 DIAGNOSIS — I110 Hypertensive heart disease with heart failure: Secondary | ICD-10-CM | POA: Diagnosis not present

## 2017-07-16 DIAGNOSIS — I509 Heart failure, unspecified: Secondary | ICD-10-CM | POA: Diagnosis not present

## 2017-07-16 DIAGNOSIS — J9611 Chronic respiratory failure with hypoxia: Secondary | ICD-10-CM | POA: Diagnosis not present

## 2017-07-16 DIAGNOSIS — J432 Centrilobular emphysema: Secondary | ICD-10-CM | POA: Diagnosis not present

## 2017-07-16 DIAGNOSIS — Z9981 Dependence on supplemental oxygen: Secondary | ICD-10-CM | POA: Diagnosis not present

## 2017-07-16 DIAGNOSIS — M810 Age-related osteoporosis without current pathological fracture: Secondary | ICD-10-CM | POA: Diagnosis not present

## 2017-07-16 DIAGNOSIS — F329 Major depressive disorder, single episode, unspecified: Secondary | ICD-10-CM | POA: Diagnosis not present

## 2017-07-16 DIAGNOSIS — F419 Anxiety disorder, unspecified: Secondary | ICD-10-CM | POA: Diagnosis not present

## 2017-07-25 DIAGNOSIS — J449 Chronic obstructive pulmonary disease, unspecified: Secondary | ICD-10-CM | POA: Diagnosis not present

## 2017-08-05 DIAGNOSIS — J449 Chronic obstructive pulmonary disease, unspecified: Secondary | ICD-10-CM | POA: Diagnosis not present

## 2017-08-05 DIAGNOSIS — J9611 Chronic respiratory failure with hypoxia: Secondary | ICD-10-CM | POA: Diagnosis not present

## 2017-08-05 DIAGNOSIS — R911 Solitary pulmonary nodule: Secondary | ICD-10-CM | POA: Diagnosis not present

## 2017-08-05 DIAGNOSIS — R300 Dysuria: Secondary | ICD-10-CM | POA: Diagnosis not present

## 2017-08-19 ENCOUNTER — Emergency Department (HOSPITAL_COMMUNITY): Admission: EM | Admit: 2017-08-19 | Discharge: 2017-08-19 | Discharge status: Absent without leave | Payer: Medicare HMO

## 2017-08-25 DIAGNOSIS — J449 Chronic obstructive pulmonary disease, unspecified: Secondary | ICD-10-CM | POA: Diagnosis not present

## 2017-08-31 ENCOUNTER — Ambulatory Visit (INDEPENDENT_AMBULATORY_CARE_PROVIDER_SITE_OTHER): Payer: Medicare HMO | Admitting: Obstetrics and Gynecology

## 2017-08-31 ENCOUNTER — Encounter: Payer: Self-pay | Admitting: Obstetrics and Gynecology

## 2017-08-31 VITALS — BP 124/78 | HR 92 | Resp 18 | Ht 60.5 in | Wt 109.0 lb

## 2017-08-31 DIAGNOSIS — Z01419 Encounter for gynecological examination (general) (routine) without abnormal findings: Secondary | ICD-10-CM

## 2017-08-31 DIAGNOSIS — N898 Other specified noninflammatory disorders of vagina: Secondary | ICD-10-CM

## 2017-08-31 DIAGNOSIS — Z113 Encounter for screening for infections with a predominantly sexual mode of transmission: Secondary | ICD-10-CM | POA: Diagnosis not present

## 2017-08-31 DIAGNOSIS — N941 Unspecified dyspareunia: Secondary | ICD-10-CM | POA: Diagnosis not present

## 2017-08-31 NOTE — Progress Notes (Signed)
71 y.o. [id number] Windowed African AmericanF here for annual exam.  She has COPD, is on O2. She still smokes crack cocaine. Her second husband died almost 2 years ago. She is back with her first husband (married for 15 yet), living together, plans to get married again. Sexually active, some discomfort on entry. He feels there is a blockage. Occasional constipation.  No vaginal bleeding, no bleeding after sex.  No itching, burning or irritation. She c/o an intermittent vaginal d/c with an odor.  No incontinence.     No LMP recorded. Patient is postmenopausal.          Sexually active: Yes.    The current method of family planning is post menopausal status.    Exercising: No.  The patient does not participate in regular exercise at present. Smoker:  Former smoker  Health Maintenance: Pap:  01-16-16 WNL  History of abnormal Pap:  no MMG:  05-16-15 WNL Colonoscopy:  unsure BMD:  unsure  TDaP:  2010 Gardasil: N/A   reports that she quit smoking about 2 years ago. Her smoking use included cigarettes. She has a 54.00 pack-year smoking history. she has never used smokeless tobacco. She reports that she drinks alcohol. She reports that she uses drugs. Drug: "Crack" cocaine. Frequency: 2.00 times per week.  Past Medical History:  Diagnosis Date  . ALLERGIC RHINITIS 05/01/2007  . Anxiety   . Arthritis    "hands" (12/28/2015)  . ASTHMA 05/01/2007   "since I was a child"  . Cancer (HCC)    LUNG  . CHF (congestive heart failure) ([place])   . Chronic bronchitis ([place])   . COPD (chronic obstructive pulmonary disease) ([place])   . DEPRESSION 07/21/2008  . DVT (deep venous thrombosis) (HCC) 1960s   LLE  . FATIGUE 10/10/2010  . HYPERTENSION 05/01/2007  . HYPERTHYROIDISM 11/23/2007   Pt endorses having had Graves disease, possibly radioactive iodine x1, but no thyroidectomy  . HYPOTHYROIDISM 08/23/2009  . LUMBAR RADICULOPATHY, RIGHT 08/25/2008  . On home oxygen therapy    "3-4L; qd; all the time" (12/28/2015)   . OSTEOPOROSIS 05/01/2007  . SHOULDER PAIN, LEFT 08/23/2009  . SINUSITIS, CHRONIC 10/10/2010    Past Surgical History:  Procedure Laterality Date  . APPENDECTOMY    . DILATION AND CURETTAGE OF UTERUS  multiple   history of multiple dialations and curettages and miscarriages, unfortunately never carrying a child to term  . ECTOPIC PREGNANCY SURGERY  "late '60s or early '70s  . ELECTROCARDIOGRAM  06/20/2006  . OOPHORECTOMY Right     Current Outpatient Medications  Medication Sig Dispense Refill  . albuterol (PROAIR HFA) 108 (90 Base) MCG/ACT inhaler Inhale 1-2 puffs into the lungs every 6 (six) hours as needed for wheezing or shortness of breath. 1 Inhaler 5  . albuterol (PROVENTIL) (2.5 MG/3ML) 0.083% nebulizer solution Take 3 mLs (2.5 mg total) by nebulization every 4 (four) hours as needed for wheezing. 75 mL 12  . ALPRAZolam (XANAX) 0.25 MG tablet Take 1 tablet (0.25 mg total) by mouth 2 (two) times daily. 60 tablet 3  . aspirin EC 81 MG EC tablet Take 1 tablet (81 mg total) by mouth daily. 30 tablet 0  . citalopram (CELEXA) 20 MG tablet Take 1 tablet (20 mg total) by mouth daily. 90 tablet 3  . feeding supplement, ENSURE ENLIVE, (ENSURE ENLIVE) LIQD Take 237 mLs by mouth 3 (three) times daily between meals. 237 mL 12  . fluticasone furoate-vilanterol (BREO ELLIPTA) 200-25 MCG/INH AEPB Inhale 1 puff into  the lungs daily. 1 each 5  . furosemide (LASIX) 40 MG tablet Take 1 tablet (40 mg total) by mouth 2 (two) times daily. 180 tablet 3  . levothyroxine (SYNTHROID, LEVOTHROID) 75 MCG tablet Take 1 tablet (75 mcg total) by mouth daily before breakfast. 90 tablet 3  . OXYGEN Inhale 4 L into the lungs continuous.     . potassium chloride (K-DUR) 10 MEQ tablet Take 1 tablet (10 mEq total) by mouth 2 (two) times daily. 180 tablet 3  . tiotropium (SPIRIVA HANDIHALER) 18 MCG inhalation capsule Place 1 capsule (18 mcg total) into inhaler and inhale daily. 90 capsule 3  . triamcinolone (KENALOG)  0.025 % cream Apply 1 application topically 3 (three) times daily. As needed for itching 80 g 3   No current facility-administered medications for this visit.     Family History  Problem Relation Age of Onset  . Lung cancer Father   . Alcohol abuse Brother   . Diabetes Brother   . Hypertension Other   . Stroke Mother   . Thyroid disease Mother   . Asthma Unknown        maternal aunts    Review of Systems  Constitutional: Negative.   HENT: Negative.   Eyes: Negative.   Respiratory: Negative.   Cardiovascular: Negative.   Gastrointestinal: Negative.   Endocrine: Negative.   Genitourinary: Negative.   Musculoskeletal: Negative.   Skin: Negative.   Allergic/Immunologic: Negative.   Neurological: Negative.   Psychiatric/Behavioral: Negative.     Exam:   BP 124/78 (BP Location: Right Arm, Patient Position: Sitting, Cuff Size: Normal)   Pulse 92   Resp 18   Ht 5' 0.5" (1.537 m)   Wt 109 lb (49.4 kg)   BMI 20.94 kg/m   Weight change: @WEIGHTCHANGE @ Height:   Height: 5' 0.5" (153.7 cm)  Ht Readings from Last 3 Encounters:  08/31/17 5' 0.5" (1.537 m)  06/16/17 5' (1.524 m)  06/03/17 5' (1.524 m)    General appearance: alert, cooperative and appears stated age Head: Normocephalic, without obvious abnormality, atraumatic Neck: no adenopathy, supple, symmetrical, trachea midline and thyroid normal to inspection and palpation Lungs: clear to auscultation bilaterally Cardiovascular: regular rate and rhythm Breasts: normal appearance, no masses or tenderness Abdomen: soft, non-tender; non distended,  no masses,  no organomegaly Extremities: extremities normal, atraumatic, no cyanosis or edema Skin: Skin color, texture, turgor normal. No rashes or lesions Lymph nodes: Cervical, supraclavicular, and axillary nodes normal. No abnormal inguinal nodes palpated Neurologic: Grossly normal   Pelvic: External genitalia:  no lesions              Urethra:  normal appearing urethra  with no masses, tenderness or lesions              Bartholins and Skenes: normal                 Vagina: normal appearing atrophic vagina with normal color and a slight increase in white watery vaginal d/c  Able to insert 2 fingers part way.               Cervix: no lesions               Bimanual Exam:  Uterus:  normal size, contour, position, consistency, mobility, non-tender              Adnexa: no mass, fullness, tenderness               Rectovaginal: Confirms  Anus:  normal sphincter tone, no lesions  Chaperone was present for exam.  A:  Well Woman with normal exam  Dyspareunia, recommend lubrication. She should control the rate and depth of penetration  Vaginal atrophy  Screening STD  Vaginal d/c with odor    P:   Screening for STD's  No pap this year  Other labs with her primary  She will ask her primary about her DEXA (had it at his office) and will check on the cologuard.   Mammogram  Discussed breast self exam  Discussed calcium and vit D intake

## 2017-08-31 NOTE — Patient Instructions (Signed)
Use a lubricant with intercourse. K-Y jelly, astroglide, olive oil, and coconut oil are good options.    EXERCISE AND DIET:  We recommended that you start or continue a regular exercise program for good health. Regular exercise means any activity that makes your heart beat faster and makes you sweat.  We recommend exercising at least 30 minutes per day at least 3 days a week, preferably 4 or 5.  We also recommend a diet low in fat and sugar.  Inactivity, poor dietary choices and obesity can cause diabetes, heart attack, stroke, and kidney damage, among others.    ALCOHOL AND SMOKING:  Women should limit their alcohol intake to no more than 7 drinks/beers/glasses of wine (combined, not each!) per week. Moderation of alcohol intake to this level decreases your risk of breast cancer and liver damage. And of course, no recreational drugs are part of a healthy lifestyle.  And absolutely no smoking or even second hand smoke. Most people know smoking can cause heart and lung diseases, but did you know it also contributes to weakening of your bones? Aging of your skin?  Yellowing of your teeth and nails?  CALCIUM AND VITAMIN D:  Adequate intake of calcium and Vitamin D are recommended.  The recommendations for exact amounts of these supplements seem to change often, but generally speaking 600 mg of calcium (either carbonate or citrate) and 800 units of Vitamin D per day seems prudent. Certain women may benefit from higher intake of Vitamin D.  If you are among these women, your doctor will have told you during your visit.    PAP SMEARS:  Pap smears, to check for cervical cancer or precancers,  have traditionally been done yearly, although recent scientific advances have shown that most women can have pap smears less often.  However, every woman still should have a physical exam from her gynecologist every year. It will include a breast check, inspection of the vulva and vagina to check for abnormal growths or skin  changes, a visual exam of the cervix, and then an exam to evaluate the size and shape of the uterus and ovaries.  And after 72 years of age, a rectal exam is indicated to check for rectal cancers. We will also provide age appropriate advice regarding health maintenance, like when you should have certain vaccines, screening for sexually transmitted diseases, bone density testing, colonoscopy, mammograms, etc.   MAMMOGRAMS:  All women over 72 years old should have a yearly mammogram. Many facilities now offer a "3D" mammogram, which may cost around $50 extra out of pocket. If possible,  we recommend you accept the option to have the 3D mammogram performed.  It both reduces the number of women who will be called back for extra views which then turn out to be normal, and it is better than the routine mammogram at detecting truly abnormal areas.    COLONOSCOPY:  Colonoscopy to screen for colon cancer is recommended for all women at age 72.  We know, you hate the idea of the prep.  We agree, BUT, having colon cancer and not knowing it is worse!!  Colon cancer so often starts as a polyp that can be seen and removed at colonscopy, which can quite literally save your life!  And if your first colonoscopy is normal and you have no family history of colon cancer, most women don't have to have it again for 10 years.  Once every ten years, you can do something that may end up  saving your life, right?  We will be happy to help you get it scheduled when you are ready.  Be sure to check your insurance coverage so you understand how much it will cost.  It may be covered as a preventative service at no cost, but you should check your particular policy.

## 2017-09-01 LAB — HEP, RPR, HIV PANEL
HIV Screen 4th Generation wRfx: NONREACTIVE
Hepatitis B Surface Ag: NEGATIVE
RPR: NONREACTIVE

## 2017-09-01 LAB — GC/CHLAMYDIA PROBE AMP
Chlamydia trachomatis, NAA: NEGATIVE
Neisseria gonorrhoeae by PCR: NEGATIVE

## 2017-09-01 LAB — HSV(HERPES SIMPLEX VRS) I + II AB-IGG: HSV 2 IgG, Type Spec: 10.6 index — ABNORMAL HIGH (ref 0.00–0.90)

## 2017-09-01 LAB — HEPATITIS C ANTIBODY: Hep C Virus Ab: <0.1 s/co ratio (ref 0.0–0.9)

## 2017-09-02 LAB — VAGINITIS/VAGINOSIS, DNA PROBE
Candida Species: NEGATIVE
GARDNERELLA VAGINALIS: NEGATIVE
TRICHOMONAS VAG: NEGATIVE

## 2017-09-03 ENCOUNTER — Other Ambulatory Visit: Payer: Self-pay | Admitting: Internal Medicine

## 2017-09-05 DIAGNOSIS — J449 Chronic obstructive pulmonary disease, unspecified: Secondary | ICD-10-CM | POA: Diagnosis not present

## 2017-09-05 DIAGNOSIS — J9611 Chronic respiratory failure with hypoxia: Secondary | ICD-10-CM | POA: Diagnosis not present

## 2017-09-05 DIAGNOSIS — R911 Solitary pulmonary nodule: Secondary | ICD-10-CM | POA: Diagnosis not present

## 2017-09-05 DIAGNOSIS — R300 Dysuria: Secondary | ICD-10-CM | POA: Diagnosis not present

## 2017-09-08 ENCOUNTER — Ambulatory Visit (INDEPENDENT_AMBULATORY_CARE_PROVIDER_SITE_OTHER)
Admission: RE | Admit: 2017-09-08 | Discharge: 2017-09-08 | Disposition: A | Discharge status: Home / Self Care | Payer: Medicare HMO | Source: Ambulatory Visit | Attending: Pulmonary Disease | Admitting: Pulmonary Disease

## 2017-09-08 DIAGNOSIS — R911 Solitary pulmonary nodule: Secondary | ICD-10-CM | POA: Diagnosis not present

## 2017-09-08 DIAGNOSIS — R918 Other nonspecific abnormal finding of lung field: Secondary | ICD-10-CM | POA: Diagnosis not present

## 2017-09-15 ENCOUNTER — Encounter: Payer: Self-pay | Admitting: Adult Health

## 2017-09-15 ENCOUNTER — Ambulatory Visit (INDEPENDENT_AMBULATORY_CARE_PROVIDER_SITE_OTHER): Payer: Medicare HMO | Admitting: Adult Health

## 2017-09-15 DIAGNOSIS — J9611 Chronic respiratory failure with hypoxia: Secondary | ICD-10-CM | POA: Diagnosis not present

## 2017-09-15 DIAGNOSIS — J449 Chronic obstructive pulmonary disease, unspecified: Secondary | ICD-10-CM | POA: Diagnosis not present

## 2017-09-15 DIAGNOSIS — R911 Solitary pulmonary nodule: Secondary | ICD-10-CM

## 2017-09-15 NOTE — Patient Instructions (Addendum)
Continue on Spiriva 1 puff daily  Continue on BREO daily  Rinse after use.  Continue on Oxygen 3l-4 l/m    Follow up for CT chest in 4 months.  Follow up Dr. Elsworth Soho  In 3-4 months and  As needed

## 2017-09-15 NOTE — Addendum Note (Signed)
Addended by: Parke Poisson E on: 09/15/2017 05:31 PM   Modules accepted: Orders

## 2017-09-15 NOTE — Assessment & Plan Note (Signed)
Stable without flare   Plan  Patient Instructions  Continue on Spiriva 1 puff daily  Continue on BREO daily  Rinse after use.  Continue on Oxygen 3l-4 l/m    Follow up for CT chest in 4 months.  Follow up Dr. Elsworth Soho  In 3-4 months and  As needed

## 2017-09-15 NOTE — Assessment & Plan Note (Signed)
Cont on O2 .  

## 2017-09-15 NOTE — Progress Notes (Signed)
@Patient  ID: Jasmine [name], female    DOB: Nov 28, 1945, 71 y.o.   MRN: [id number]  Chief Complaint  Patient presents with  . Follow-up    COPD     Referring provider: Biagio Borg, MD  HPI: [phone].o former smoker for FU of COPD .Seen for initial pulmonary eval 02/03/13  She was started on O2 in march 2014 &uses 33 liters 02 continuously and w/ sleep, and 4L pulsed with activity.  She has smoked Since her 65's - 20 Pyrs. Quit 07/21/2015. Ongoing cocaine use /polysubstance abuse  Husband passed away 2016/01/25 while she was hospitalized.  TEST CT chest 10/2011 >>showed Moderate centrilobular emphysema. Small bilateral pleural effusions with dependent atelectasis  CT chest 07/2016-resolving right upper lobe consolidation,spiculated subcentimeter nodules that have been noted before with low-grade PET hypermetabolism  PFT >FEV1 at 0.47 L( 30%), ratio 39 , No BD response (<200cc)  2014 completed Pulm Rehab   09/15/2017 Follow up : COPD , O2 RF , Lung nodule  Patient presents for a 69-month follow-up.  She says her breathing is doing okay overall.  She remains on [place] .  She is on oxygen 3 L at rest and 4 L with activity. She denies any chest pain orthopnea PND or increased leg swelling Flu shot utd.  She remains independent and drives.  Still using cocaine . Discussed cessation.  CT chest done last week showed stable scattered nodules  And RUL nodule slight increase in region of previous consolidation recommended for repeat CT chest in 4- 6 months.    Allergies  Allergen Reactions  . Alendronate Sodium Other (See Comments)    Pt does not remember this reaction    Immunization History  Administered Date(s) Administered  . Influenza Split 07/10/2011, 08/18/2012  . Influenza Whole 07/21/2008, 08/23/2009, 10/10/2010  . Influenza, High Dose Seasonal PF 07/10/2016, 09/03/2017  . Influenza,inj,Quad PF,6+ Mos 08/12/2013, 07/10/2014  . Pneumococcal Conjugate-13  11/30/2014  . Pneumococcal Polysaccharide-23 07/21/2007, 01/12/2013  . Td 08/23/2009  . Zoster 07/20/2006    Past Medical History:  Diagnosis Date  . ALLERGIC RHINITIS 05/01/2007  . Anxiety   . Arthritis    "hands" (12/28/2015)  . ASTHMA 05/01/2007   "since I was a child"  . Cancer (HCC)    LUNG  . CHF (congestive heart failure) ([place])   . Chronic bronchitis ([place])   . COPD (chronic obstructive pulmonary disease) ([place])   . DEPRESSION 07/21/2008  . DVT (deep venous thrombosis) (HCC) 1960s   LLE  . FATIGUE 10/10/2010  . HYPERTENSION 05/01/2007  . HYPERTHYROIDISM 11/23/2007   Pt endorses having had Graves disease, possibly radioactive iodine x1, but no thyroidectomy  . HYPOTHYROIDISM 08/23/2009  . LUMBAR RADICULOPATHY, RIGHT 08/25/2008  . On home oxygen therapy    "3-4L; qd; all the time" (12/28/2015)  . OSTEOPOROSIS 05/01/2007  . SHOULDER PAIN, LEFT 08/23/2009  . SINUSITIS, CHRONIC 10/10/2010    Tobacco History: Social History   Tobacco Use  Smoking Status Former Smoker  . Packs/day: 1.00  . Years: 54.00  . Pack years: 54.00  . Types: Cigarettes  . Last attempt to quit: 07/21/2015  . Years since quitting: 2.1  Smokeless Tobacco Never Used   Counseling given: Not Answered   Outpatient Encounter Medications as of 09/15/2017  Medication Sig  . albuterol (PROAIR HFA) 108 (90 Base) MCG/ACT inhaler Inhale 1-2 puffs into the lungs every 6 (six) hours as needed for wheezing or shortness of breath.  [name] albuterol (PROVENTIL) (2.5 MG/3ML)  0.083% nebulizer solution Take 3 mLs (2.5 mg total) by nebulization every 4 (four) hours as needed for wheezing.  [name] ALPRAZolam (XANAX) 0.25 MG tablet Take 1 tablet (0.25 mg total) by mouth 2 (two) times daily.  [name] aspirin EC 81 MG EC tablet Take 1 tablet (81 mg total) by mouth daily.  . citalopram (CELEXA) 20 MG tablet Take 1 tablet (20 mg total) by mouth daily.  . feeding supplement, ENSURE ENLIVE, (ENSURE ENLIVE) LIQD Take 237 mLs by mouth 3 (three)  times daily between meals.  . fluticasone furoate-vilanterol (BREO ELLIPTA) 200-25 MCG/INH AEPB Inhale 1 puff into the lungs daily.  . furosemide (LASIX) 40 MG tablet Take 1 tablet (40 mg total) by mouth 2 (two) times daily.  [name] levothyroxine (SYNTHROID, LEVOTHROID) 75 MCG tablet TAKE 1 TABLET BY MOUTH DAILY BEFORE BREAKFAST.  [name] OXYGEN Inhale 4 L into the lungs continuous.   . potassium chloride (K-DUR) 10 MEQ tablet Take 1 tablet (10 mEq total) by mouth 2 (two) times daily.  [name] tiotropium (SPIRIVA HANDIHALER) 18 MCG inhalation capsule Place 1 capsule (18 mcg total) into inhaler and inhale daily.  [name] triamcinolone (KENALOG) 0.025 % cream Apply 1 application topically 3 (three) times daily. As needed for itching   No facility-administered encounter medications on file as of 09/15/2017.      Review of Systems  Constitutional:   No  weight loss, night sweats,  Fevers, chills, fatigue, or  lassitude.  HEENT:   No headaches,  Difficulty swallowing,  Tooth/dental problems, or  Sore throat,                No sneezing, itching, ear ache, nasal congestion, post nasal drip,   CV:  No chest pain,  Orthopnea, PND, swelling in lower extremities, anasarca, dizziness, palpitations, syncope.   GI  No heartburn, indigestion, abdominal pain, nausea, vomiting, diarrhea, change in bowel habits, loss of appetite, bloody stools.   Resp: No shortness of breath with exertion or at rest.  No excess mucus, no productive cough,  No non-productive cough,  No coughing up of blood.  No change in color of mucus.  No wheezing.  No chest wall deformity  Skin: no rash or lesions.  GU: no dysuria, change in color of urine, no urgency or frequency.  No flank pain, no hematuria   MS:  No joint pain or swelling.  No decreased range of motion.  No back pain.    Physical Exam  BP 112/74 (BP Location: Left Arm, Cuff Size: Normal)   Pulse 82   Ht 5' (1.524 m)   Wt 111 lb 14.4 oz (50.8 kg)   SpO2 92%   BMI 21.85 kg/m    GEN: A/Ox3; pleasant , NAD, elderly on o2    HEENT:  [place]/AT,  EACs-clear, TMs-wnl, NOSE-clear, THROAT-clear, no lesions, no postnasal drip or exudate noted.   NECK:  Supple w/ fair ROM; no JVD; normal carotid impulses w/o bruits; no thyromegaly or nodules palpated; no lymphadenopathy.    RESP  Decreased BS in bases , no accessory muscle use, no dullness to percussion  CARD:  RRR, no m/r/g, no peripheral edema, pulses intact, no cyanosis or clubbing.  GI:   Soft & nt; nml bowel sounds; no organomegaly or masses detected.   Musco: Warm bil, no deformities or joint swelling noted.   Neuro: alert, no focal deficits noted.    Skin: Warm, no lesions or rashes   BNP  Imaging: Ct Chest Wo Contrast  Result Date:  09/09/2017 CLINICAL DATA:  Lung nodule. EXAM: CT CHEST WITHOUT CONTRAST TECHNIQUE: Multidetector CT imaging of the chest was performed following the standard protocol without IV contrast. COMPARISON:  03/04/2017 FINDINGS: Cardiovascular: The heart size is normal. No pericardial effusion. Coronary artery calcification is evident. Atherosclerotic calcification is noted in the wall of the thoracic aorta. Mediastinum/Nodes: No mediastinal lymphadenopathy. No evidence for gross hilar lymphadenopathy although assessment is limited by the lack of intravenous contrast on today's study. The esophagus has normal imaging features. There is no axillary lymphadenopathy. Lungs/Pleura: Emphysema again noted. 2.5 x 1.4 cm posterior right upper lobe pulmonary nodule has increased slightly in size in the interval common measured at 2.0 x 1.3 cm on the prior study. Irregular 5 mm posterior left upper lobe nodule (image 46 series 3) is stable. 8 mm left upper lobe pulmonary nodule identified previously is similar, measuring 9 mm today (image 50 series 3). 8 mm irregular nodule right upper lobe nodule (image 50 series 3) is unchanged. 4 mm left upper lobe nodule identified previously is stable (image 50 series  3). Upper Abdomen: Thickening left adrenal gland similar to prior. Musculoskeletal: Bone windows reveal no worrisome lytic or sclerotic osseous lesions. Mild superior endplate compression at L2 is similar. IMPRESSION: 1. Slight interval increase in size of the dominant lung lesion identified posterior right upper lobe. 2. Additional smaller bilateral pulmonary nodules are similar. Continued surveillance likely warranted. 3. Emphysema 4. Coronary artery and Aortic Atherosclerois (ICD10-170.0) Electronically Signed   By: Misty Stanley M.D.   On: 09/09/2017 08:22     Assessment & Plan:   COPD (chronic obstructive pulmonary disease) ([place]) Stable without flare   Plan  Patient Instructions  Continue on Spiriva 1 puff daily  Continue on BREO daily  Rinse after use.  Continue on Oxygen 3l-4 l/m    Follow up for CT chest in 4 months.  Follow up Dr. Elsworth Soho  In 3-4 months and  As needed                Chronic respiratory failure with hypoxia ([place]) Cont on O2   Solitary pulmonary nodule Cont w/ serial CT follow up  Check CT chest in 4 months .      Rexene Edison, NP 09/15/2017

## 2017-09-15 NOTE — Assessment & Plan Note (Signed)
Cont w/ serial CT follow up  Check CT chest in 4 months .

## 2017-09-18 NOTE — Progress Notes (Signed)
Reviewed & agree with plan  

## 2017-09-24 DIAGNOSIS — J449 Chronic obstructive pulmonary disease, unspecified: Secondary | ICD-10-CM | POA: Diagnosis not present

## 2017-10-05 DIAGNOSIS — J449 Chronic obstructive pulmonary disease, unspecified: Secondary | ICD-10-CM | POA: Diagnosis not present

## 2017-10-05 DIAGNOSIS — J9611 Chronic respiratory failure with hypoxia: Secondary | ICD-10-CM | POA: Diagnosis not present

## 2017-10-05 DIAGNOSIS — R300 Dysuria: Secondary | ICD-10-CM | POA: Diagnosis not present

## 2017-10-05 DIAGNOSIS — R911 Solitary pulmonary nodule: Secondary | ICD-10-CM | POA: Diagnosis not present

## 2017-10-21 ENCOUNTER — Other Ambulatory Visit: Payer: Self-pay | Admitting: Internal Medicine

## 2017-10-22 ENCOUNTER — Telehealth: Payer: Self-pay | Admitting: Internal Medicine

## 2017-10-22 MED ORDER — ALPRAZOLAM 0.25 MG PO TABS: 0.2500 mg | ORAL_TABLET | Freq: Two times a day (BID) | ORAL | 3 refills | Status: DC

## 2017-10-22 NOTE — Telephone Encounter (Signed)
Xanax done erx 

## 2017-10-25 DIAGNOSIS — J449 Chronic obstructive pulmonary disease, unspecified: Secondary | ICD-10-CM | POA: Diagnosis not present

## 2017-11-05 DIAGNOSIS — J9611 Chronic respiratory failure with hypoxia: Secondary | ICD-10-CM | POA: Diagnosis not present

## 2017-11-05 DIAGNOSIS — R300 Dysuria: Secondary | ICD-10-CM | POA: Diagnosis not present

## 2017-11-05 DIAGNOSIS — R911 Solitary pulmonary nodule: Secondary | ICD-10-CM | POA: Diagnosis not present

## 2017-11-05 DIAGNOSIS — J449 Chronic obstructive pulmonary disease, unspecified: Secondary | ICD-10-CM | POA: Diagnosis not present

## 2017-11-13 ENCOUNTER — Telehealth: Payer: Self-pay | Admitting: Internal Medicine

## 2017-11-13 NOTE — Telephone Encounter (Signed)
Ok to send records requested

## 2017-11-13 NOTE — Telephone Encounter (Signed)
Done

## 2017-11-13 NOTE — Telephone Encounter (Signed)
Please advise 

## 2017-11-13 NOTE — Telephone Encounter (Signed)
Copied from [place]. Topic: Inquiry >> Nov 12, 2017  5:16 PM Boyd Kerbs wrote: Reason for CRM:   Starleen Blue [phone]   Fax [phone]  [facility] [employer] faxed paperwork on the 37DS, received certification of medical necessity back , but needs the clinicals medical records.

## 2017-11-25 DIAGNOSIS — J449 Chronic obstructive pulmonary disease, unspecified: Secondary | ICD-10-CM | POA: Diagnosis not present

## 2017-12-06 DIAGNOSIS — R911 Solitary pulmonary nodule: Secondary | ICD-10-CM | POA: Diagnosis not present

## 2017-12-06 DIAGNOSIS — R300 Dysuria: Secondary | ICD-10-CM | POA: Diagnosis not present

## 2017-12-06 DIAGNOSIS — J9611 Chronic respiratory failure with hypoxia: Secondary | ICD-10-CM | POA: Diagnosis not present

## 2017-12-06 DIAGNOSIS — J449 Chronic obstructive pulmonary disease, unspecified: Secondary | ICD-10-CM | POA: Diagnosis not present

## 2017-12-14 ENCOUNTER — Other Ambulatory Visit: Payer: Self-pay | Admitting: Internal Medicine

## 2017-12-14 ENCOUNTER — Telehealth: Payer: Self-pay | Admitting: Pulmonary Disease

## 2017-12-14 DIAGNOSIS — R911 Solitary pulmonary nodule: Secondary | ICD-10-CM

## 2017-12-14 NOTE — Telephone Encounter (Signed)
RA please advise, patients CT scan has been scheduled for 1 month after she sees you. Does this need to be changed or is this ok, please advise. Thanks.

## 2017-12-14 NOTE — Telephone Encounter (Signed)
[facility] can we please schedule this sooner, thanks.

## 2017-12-14 NOTE — Telephone Encounter (Signed)
Please schedule CT scan sooner before appointment

## 2017-12-15 NOTE — Telephone Encounter (Signed)
New order has been placed. 

## 2017-12-15 NOTE — Telephone Encounter (Signed)
Hey I will need a new order put in as the one that the Ct was scheduled off of had a start date of 01/13/18

## 2017-12-16 ENCOUNTER — Ambulatory Visit (INDEPENDENT_AMBULATORY_CARE_PROVIDER_SITE_OTHER)
Admission: RE | Admit: 2017-12-16 | Discharge: 2017-12-16 | Disposition: A | Discharge status: Home / Self Care | Payer: Medicare HMO | Source: Ambulatory Visit | Attending: Pulmonary Disease | Admitting: Pulmonary Disease

## 2017-12-16 DIAGNOSIS — R918 Other nonspecific abnormal finding of lung field: Secondary | ICD-10-CM | POA: Diagnosis not present

## 2017-12-16 DIAGNOSIS — R911 Solitary pulmonary nodule: Secondary | ICD-10-CM

## 2017-12-17 ENCOUNTER — Encounter: Payer: Self-pay | Admitting: Pulmonary Disease

## 2017-12-17 ENCOUNTER — Ambulatory Visit (INDEPENDENT_AMBULATORY_CARE_PROVIDER_SITE_OTHER): Payer: Medicare HMO | Admitting: Pulmonary Disease

## 2017-12-17 DIAGNOSIS — R918 Other nonspecific abnormal finding of lung field: Secondary | ICD-10-CM

## 2017-12-17 DIAGNOSIS — J432 Centrilobular emphysema: Secondary | ICD-10-CM

## 2017-12-17 DIAGNOSIS — Z720 Tobacco use: Secondary | ICD-10-CM

## 2017-12-17 DIAGNOSIS — R911 Solitary pulmonary nodule: Secondary | ICD-10-CM | POA: Diagnosis not present

## 2017-12-17 MED ORDER — TIOTROPIUM BROMIDE MONOHYDRATE 18 MCG IN CAPS: 18.0000 ug | ORAL_CAPSULE | Freq: Every day | RESPIRATORY_TRACT | 3 refills | Status: DC

## 2017-12-17 MED ORDER — ALBUTEROL SULFATE HFA 108 (90 BASE) MCG/ACT IN AERS: 1.0000 | INHALATION_SPRAY | Freq: Four times a day (QID) | RESPIRATORY_TRACT | 5 refills | Status: DC | PRN

## 2017-12-17 MED ORDER — FLUTICASONE FUROATE-VILANTEROL 200-25 MCG/INH IN AEPB: 1.0000 | INHALATION_SPRAY | Freq: Every day | RESPIRATORY_TRACT | 5 refills | Status: DC

## 2017-12-17 NOTE — Assessment & Plan Note (Signed)
Refills on breo/ spiriva & pro-air

## 2017-12-17 NOTE — Progress Notes (Signed)
   Subjective:    Patient ID: Jasmine [name], female    DOB: 1946-02-02, 72 y.o.   MRN: [id number]  HPI  72 y.o smoker for FU of COPD .Seen for initial pulmonary eval 02/03/13  She was started on O2 in January 29, 2013&uses 33 liters 02 continuously and w/ sleep, and 4L pulsed with activity.  She has smoked Since her 56's - 16 Pyrs. Quit 07/21/2015.History of cocaine use . Husband passed away 01-30-16   Her breathing is at baseline.  She has received a portable oxygen concentrator and she is compliant She is compliant with Spiriva and breo She is quits smoking but unfortunately continues to use cocaine We discussed CT scan today   Significant tests/ events reviewed   CT chest 10/2011 >>showed Moderate centrilobular emphysema. Small bilateral pleural effusions with dependent atelectasis  CT chest 07/2016-resolving right upper lobe consolidation,spiculated subcentimeter nodules that have been noted before with low-grade PET hypermetabolism  CT chest 11/2017 >> stable pulmonary nodules  PFT5/2014  >FEV1 at 0.47 L( 30%), ratio 39 , No BD response (<200cc)  01/29/2013 completed Pulm Rehab   01-30-2016  staph aureus bacteremia  06/2016  right upper lobe community acquired pneumonia   Review of Systems neg for any significant sore throat, dysphagia, itching, sneezing, nasal congestion or excess/ purulent secretions, fever, chills, sweats, unintended wt loss, pleuritic or exertional cp, hempoptysis, orthopnea pnd or change in chronic leg swelling. Also denies presyncope, palpitations, heartburn, abdominal pain, nausea, vomiting, diarrhea or change in bowel or urinary habits, dysuria,hematuria, rash, arthralgias, visual complaints, headache, numbness weakness or ataxia.     Objective:   Physical Exam   Gen. Pleasant, well-nourished, in no distress ENT - no thrush, no post nasal drip, on [place] Neck: No JVD, no thyromegaly, no carotid bruits Lungs: no use of accessory muscles, no dullness to  percussion, clear without rales or rhonchi  Cardiovascular: Rhythm regular, heart sounds  normal, no murmurs or gallops, no peripheral edema Musculoskeletal: No deformities, no cyanosis or clubbing          Assessment & Plan:

## 2017-12-17 NOTE — Progress Notes (Signed)
Ct

## 2017-12-17 NOTE — Addendum Note (Signed)
Addended by: Valerie Salts on: 12/17/2017 04:13 PM   Modules accepted: Orders

## 2017-12-17 NOTE — Patient Instructions (Signed)
Nodules appear stable  Repeat CT scan in Feb 2020 Refills on breo/ spiriva & pro-air

## 2017-12-17 NOTE — Assessment & Plan Note (Signed)
Ct O2 

## 2017-12-17 NOTE — Assessment & Plan Note (Signed)
counselled about cocaine use

## 2017-12-17 NOTE — Assessment & Plan Note (Signed)
Nodules appear stable  Repeat CT scan in Feb 2020

## 2017-12-23 DIAGNOSIS — J449 Chronic obstructive pulmonary disease, unspecified: Secondary | ICD-10-CM | POA: Diagnosis not present

## 2017-12-30 ENCOUNTER — Telehealth: Payer: Self-pay | Admitting: Obstetrics and Gynecology

## 2017-12-30 NOTE — Telephone Encounter (Signed)
Spoke with patient. Patient has additional questions about HSV testing from 08/31/17, questions answered regarding positive testing, but patient would like to discuss hx further. Has a tender area on lip line that she believes is a "pimple", comes and goes, has been for a long time, is unsure if this is a pimple or cold sore.   Recommended OV for further discussion of hsv, OV scheduled for 01/05/17 at 3:30pm with Dr. Talbert Nan. Patient has additional questions regarding insurance, advised will forward concerns to business office for return call.   Routing to provider for final review. Patient is agreeable to disposition. Will close encounter.  Cc: Leeroy Bock

## 2017-12-30 NOTE — Telephone Encounter (Signed)
Patient would like to speak with nurse about her herpes diagnosis.

## 2018-01-03 DIAGNOSIS — R300 Dysuria: Secondary | ICD-10-CM | POA: Diagnosis not present

## 2018-01-03 DIAGNOSIS — R911 Solitary pulmonary nodule: Secondary | ICD-10-CM | POA: Diagnosis not present

## 2018-01-03 DIAGNOSIS — J9611 Chronic respiratory failure with hypoxia: Secondary | ICD-10-CM | POA: Diagnosis not present

## 2018-01-03 DIAGNOSIS — J449 Chronic obstructive pulmonary disease, unspecified: Secondary | ICD-10-CM | POA: Diagnosis not present

## 2018-01-05 ENCOUNTER — Ambulatory Visit: Payer: Self-pay | Admitting: Obstetrics and Gynecology

## 2018-01-05 ENCOUNTER — Telehealth: Payer: Self-pay | Admitting: Obstetrics and Gynecology

## 2018-01-05 ENCOUNTER — Encounter: Payer: Self-pay | Admitting: Obstetrics and Gynecology

## 2018-01-05 NOTE — Telephone Encounter (Signed)
Patient canceled her appointment for hsv consult today and rescheduled for 01/12/18. Patient states that she is coming in with her ex-husband and he has a conflict . Patient said 'I am very sorry but, I should have checked with him before scheduling". I informed patient of the 24 hour cancellation policy.

## 2018-01-07 ENCOUNTER — Inpatient Hospital Stay (HOSPITAL_COMMUNITY)
Admission: EM | Admit: 2018-01-07 | Discharge: 2018-01-09 | DRG: 190 | Disposition: A | Discharge status: Home / Self Care | Payer: Medicare HMO | Attending: Pulmonary Disease | Admitting: Pulmonary Disease

## 2018-01-07 ENCOUNTER — Emergency Department (HOSPITAL_COMMUNITY): Payer: Medicare HMO

## 2018-01-07 DIAGNOSIS — R531 Weakness: Secondary | ICD-10-CM | POA: Diagnosis not present

## 2018-01-07 DIAGNOSIS — J810 Acute pulmonary edema: Secondary | ICD-10-CM | POA: Diagnosis not present

## 2018-01-07 DIAGNOSIS — Z79899 Other long term (current) drug therapy: Secondary | ICD-10-CM | POA: Diagnosis not present

## 2018-01-07 DIAGNOSIS — J9602 Acute respiratory failure with hypercapnia: Secondary | ICD-10-CM

## 2018-01-07 DIAGNOSIS — J9621 Acute and chronic respiratory failure with hypoxia: Secondary | ICD-10-CM | POA: Diagnosis not present

## 2018-01-07 DIAGNOSIS — Z7982 Long term (current) use of aspirin: Secondary | ICD-10-CM

## 2018-01-07 DIAGNOSIS — R109 Unspecified abdominal pain: Secondary | ICD-10-CM | POA: Diagnosis not present

## 2018-01-07 DIAGNOSIS — J441 Chronic obstructive pulmonary disease with (acute) exacerbation: Principal | ICD-10-CM | POA: Diagnosis present

## 2018-01-07 DIAGNOSIS — Z888 Allergy status to other drugs, medicaments and biological substances status: Secondary | ICD-10-CM

## 2018-01-07 DIAGNOSIS — R4182 Altered mental status, unspecified: Secondary | ICD-10-CM

## 2018-01-07 DIAGNOSIS — Z79890 Hormone replacement therapy (postmenopausal): Secondary | ICD-10-CM

## 2018-01-07 DIAGNOSIS — F1410 Cocaine abuse, uncomplicated: Secondary | ICD-10-CM | POA: Diagnosis not present

## 2018-01-07 DIAGNOSIS — G9340 Encephalopathy, unspecified: Secondary | ICD-10-CM

## 2018-01-07 DIAGNOSIS — I5032 Chronic diastolic (congestive) heart failure: Secondary | ICD-10-CM | POA: Diagnosis present

## 2018-01-07 DIAGNOSIS — I110 Hypertensive heart disease with heart failure: Secondary | ICD-10-CM | POA: Diagnosis not present

## 2018-01-07 DIAGNOSIS — Z9981 Dependence on supplemental oxygen: Secondary | ICD-10-CM | POA: Diagnosis not present

## 2018-01-07 DIAGNOSIS — R55 Syncope and collapse: Secondary | ICD-10-CM | POA: Diagnosis not present

## 2018-01-07 DIAGNOSIS — R404 Transient alteration of awareness: Secondary | ICD-10-CM | POA: Diagnosis not present

## 2018-01-07 DIAGNOSIS — F419 Anxiety disorder, unspecified: Secondary | ICD-10-CM | POA: Diagnosis present

## 2018-01-07 DIAGNOSIS — S199XXA Unspecified injury of neck, initial encounter: Secondary | ICD-10-CM | POA: Diagnosis not present

## 2018-01-07 DIAGNOSIS — J9622 Acute and chronic respiratory failure with hypercapnia: Secondary | ICD-10-CM | POA: Diagnosis not present

## 2018-01-07 DIAGNOSIS — G8929 Other chronic pain: Secondary | ICD-10-CM | POA: Diagnosis present

## 2018-01-07 DIAGNOSIS — S0990XA Unspecified injury of head, initial encounter: Secondary | ICD-10-CM | POA: Diagnosis not present

## 2018-01-07 DIAGNOSIS — R4020 Unspecified coma: Secondary | ICD-10-CM | POA: Diagnosis not present

## 2018-01-07 DIAGNOSIS — F1310 Sedative, hypnotic or anxiolytic abuse, uncomplicated: Secondary | ICD-10-CM

## 2018-01-07 DIAGNOSIS — J9620 Acute and chronic respiratory failure, unspecified whether with hypoxia or hypercapnia: Secondary | ICD-10-CM | POA: Diagnosis present

## 2018-01-07 DIAGNOSIS — E039 Hypothyroidism, unspecified: Secondary | ICD-10-CM | POA: Diagnosis present

## 2018-01-07 DIAGNOSIS — J9690 Respiratory failure, unspecified, unspecified whether with hypoxia or hypercapnia: Secondary | ICD-10-CM | POA: Diagnosis not present

## 2018-01-07 DIAGNOSIS — W19XXXA Unspecified fall, initial encounter: Secondary | ICD-10-CM

## 2018-01-07 DIAGNOSIS — F1390 Sedative, hypnotic, or anxiolytic use, unspecified, uncomplicated: Secondary | ICD-10-CM

## 2018-01-07 DIAGNOSIS — F329 Major depressive disorder, single episode, unspecified: Secondary | ICD-10-CM | POA: Diagnosis not present

## 2018-01-07 DIAGNOSIS — F1999 Other psychoactive substance use, unspecified with unspecified psychoactive substance-induced disorder: Secondary | ICD-10-CM | POA: Diagnosis not present

## 2018-01-07 DIAGNOSIS — S299XXA Unspecified injury of thorax, initial encounter: Secondary | ICD-10-CM | POA: Diagnosis not present

## 2018-01-07 LAB — COMPREHENSIVE METABOLIC PANEL
ALK PHOS: 65 U/L (ref 38–126)
ALT: 14 U/L (ref 14–54)
ANION GAP: 15 (ref 5–15)
AST: 28 U/L (ref 15–41)
Albumin: 4 g/dL (ref 3.5–5.0)
BILIRUBIN TOTAL: 0.7 mg/dL (ref 0.3–1.2)
BUN: 11 mg/dL (ref 6–20)
CALCIUM: 9.4 mg/dL (ref 8.9–10.3)
CO2: 35 mmol/L — ABNORMAL HIGH (ref 22–32)
Chloride: 91 mmol/L — ABNORMAL LOW (ref 101–111)
Creatinine, Ser: 0.78 mg/dL (ref 0.44–1.00)
Glucose, Bld: 150 mg/dL — ABNORMAL HIGH (ref 65–99)
Potassium: 5.5 mmol/L — ABNORMAL HIGH (ref 3.5–5.1)
Sodium: 141 mmol/L (ref 135–145)
TOTAL PROTEIN: 7.3 g/dL (ref 6.5–8.1)

## 2018-01-07 LAB — CBC WITH DIFFERENTIAL/PLATELET
Basophils Absolute: 0 10*3/uL (ref 0.0–0.1)
Basophils Relative: 0 %
Eosinophils Absolute: 0 10*3/uL (ref 0.0–0.7)
Eosinophils Relative: 0 %
HEMATOCRIT: 44.3 % (ref 36.0–46.0)
HEMOGLOBIN: 12.8 g/dL (ref 12.0–15.0)
LYMPHS ABS: 0.5 10*3/uL — AB (ref 0.7–4.0)
LYMPHS PCT: 5 %
MCH: 30.6 pg (ref 26.0–34.0)
MCHC: 28.9 g/dL — AB (ref 30.0–36.0)
MCV: 106 fL — AB (ref 78.0–100.0)
MONOS PCT: 10 %
Monocytes Absolute: 1.1 10*3/uL — ABNORMAL HIGH (ref 0.1–1.0)
NEUTROS PCT: 85 %
Neutro Abs: 9.4 10*3/uL — ABNORMAL HIGH (ref 1.7–7.7)
Platelets: 146 10*3/uL — ABNORMAL LOW (ref 150–400)
RBC: 4.18 MIL/uL (ref 3.87–5.11)
RDW: 14.6 % (ref 11.5–15.5)
WBC: 11 10*3/uL — ABNORMAL HIGH (ref 4.0–10.5)

## 2018-01-07 LAB — URINALYSIS, ROUTINE W REFLEX MICROSCOPIC
BACTERIA UA: NONE SEEN
BILIRUBIN URINE: NEGATIVE
Glucose, UA: NEGATIVE mg/dL
HGB URINE DIPSTICK: NEGATIVE
KETONES UR: 5 mg/dL — AB
LEUKOCYTES UA: NEGATIVE
NITRITE: NEGATIVE
Protein, ur: 100 mg/dL — AB
SQUAMOUS EPITHELIAL / LPF: NONE SEEN
Specific Gravity, Urine: 1.021 (ref 1.005–1.030)
pH: 6 (ref 5.0–8.0)

## 2018-01-07 LAB — RAPID URINE DRUG SCREEN, HOSP PERFORMED
Amphetamines: NOT DETECTED
Barbiturates: NOT DETECTED
Benzodiazepines: POSITIVE — AB
COCAINE: POSITIVE — AB
OPIATES: NOT DETECTED
Tetrahydrocannabinol: NOT DETECTED

## 2018-01-07 LAB — CBC
HCT: 42.8 % (ref 36.0–46.0)
HEMOGLOBIN: 12.4 g/dL (ref 12.0–15.0)
MCH: 30.9 pg (ref 26.0–34.0)
MCHC: 29 g/dL — AB (ref 30.0–36.0)
MCV: 106.7 fL — ABNORMAL HIGH (ref 78.0–100.0)
PLATELETS: 162 10*3/uL (ref 150–400)
RBC: 4.01 MIL/uL (ref 3.87–5.11)
RDW: 14.6 % (ref 11.5–15.5)
WBC: 6.8 10*3/uL (ref 4.0–10.5)

## 2018-01-07 LAB — I-STAT VENOUS BLOOD GAS, ED
Acid-Base Excess: 14 mmol/L — ABNORMAL HIGH (ref 0.0–2.0)
Bicarbonate: 45.3 mmol/L — ABNORMAL HIGH (ref 20.0–28.0)
O2 SAT: 34 %
TCO2: 48 mmol/L — AB (ref 22–32)
pCO2, Ven: 87.8 mmHg (ref 44.0–60.0)
pH, Ven: 7.32 (ref 7.250–7.430)
pO2, Ven: 24 mmHg — CL (ref 32.0–45.0)

## 2018-01-07 LAB — CREATININE, SERUM
Creatinine, Ser: 0.73 mg/dL (ref 0.44–1.00)
GFR calc Af Amer: >60 mL/min (ref >60)

## 2018-01-07 LAB — MRSA PCR SCREENING: MRSA by PCR: NEGATIVE

## 2018-01-07 LAB — ETHANOL

## 2018-01-07 LAB — I-STAT TROPONIN, ED: Troponin i, poc: 0 ng/mL (ref 0.00–0.08)

## 2018-01-07 LAB — GLUCOSE, CAPILLARY: Glucose-Capillary: 99 mg/dL (ref 65–99)

## 2018-01-07 LAB — TROPONIN I: Troponin I: <0.03 ng/mL (ref <0.03)

## 2018-01-07 MED ORDER — ALPRAZOLAM 0.25 MG PO TABS
0.2500 mg | ORAL_TABLET | Freq: Three times a day (TID) | ORAL | Status: DC | PRN
Start: 2018-01-07 — End: 2018-01-09
  Administered 2018-01-07 – 2018-01-08 (×3): 0.25 mg via ORAL
  Filled 2018-01-07 (×3): qty 1

## 2018-01-07 MED ORDER — FUROSEMIDE 10 MG/ML IJ SOLN
40.0000 mg | Freq: Once | INTRAMUSCULAR | Status: AC
  Administered 2018-01-07: 40 mg via INTRAVENOUS
  Filled 2018-01-07: qty 4

## 2018-01-07 MED ORDER — IOPAMIDOL (ISOVUE-300) INJECTION 61%
INTRAVENOUS | Status: AC
  Administered 2018-01-07: 60 mL
  Filled 2018-01-07: qty 30

## 2018-01-07 MED ORDER — IPRATROPIUM-ALBUTEROL 0.5-2.5 (3) MG/3ML IN SOLN
3.0000 mL | Freq: Four times a day (QID) | RESPIRATORY_TRACT | Status: DC
  Administered 2018-01-07: 3 mL via RESPIRATORY_TRACT
  Filled 2018-01-07 (×3): qty 3

## 2018-01-07 MED ORDER — IOPAMIDOL (ISOVUE-300) INJECTION 61%
INTRAVENOUS | Status: AC
  Filled 2018-01-07: qty 100

## 2018-01-07 MED ORDER — CITALOPRAM HYDROBROMIDE 10 MG PO TABS
20.0000 mg | ORAL_TABLET | Freq: Every day | ORAL | Status: DC
  Administered 2018-01-08 – 2018-01-09 (×2): 20 mg via ORAL
  Filled 2018-01-07 (×2): qty 2

## 2018-01-07 MED ORDER — CHLORHEXIDINE GLUCONATE 0.12 % MT SOLN
15.0000 mL | Freq: Two times a day (BID) | OROMUCOSAL | Status: DC
  Administered 2018-01-07 – 2018-01-08 (×4): 15 mL via OROMUCOSAL
  Filled 2018-01-07 (×2): qty 15

## 2018-01-07 MED ORDER — LEVOTHYROXINE SODIUM 75 MCG PO TABS
75.0000 ug | ORAL_TABLET | Freq: Every day | ORAL | Status: DC
  Administered 2018-01-08 – 2018-01-09 (×2): 75 ug via ORAL
  Filled 2018-01-07 (×2): qty 1

## 2018-01-07 MED ORDER — PANTOPRAZOLE SODIUM 40 MG IV SOLR
40.0000 mg | Freq: Every day | INTRAVENOUS | Status: DC
  Administered 2018-01-07: 40 mg via INTRAVENOUS
  Filled 2018-01-07: qty 40

## 2018-01-07 MED ORDER — HEPARIN SODIUM (PORCINE) 5000 UNIT/ML IJ SOLN
5000.0000 [IU] | Freq: Three times a day (TID) | INTRAMUSCULAR | Status: DC
  Administered 2018-01-08 – 2018-01-09 (×2): 5000 [IU] via SUBCUTANEOUS
  Filled 2018-01-07 (×3): qty 1

## 2018-01-07 MED ORDER — SODIUM CHLORIDE 0.9 % IV BOLUS (SEPSIS)
500.0000 mL | Freq: Once | INTRAVENOUS | Status: AC
  Administered 2018-01-07: 500 mL via INTRAVENOUS

## 2018-01-07 MED ORDER — ALBUTEROL SULFATE (2.5 MG/3ML) 0.083% IN NEBU: 2.5000 mg | INHALATION_SOLUTION | RESPIRATORY_TRACT | Status: DC | PRN

## 2018-01-07 MED ORDER — SODIUM CHLORIDE 0.9 % IV SOLN
100.0000 mg | Freq: Two times a day (BID) | INTRAVENOUS | Status: DC
  Administered 2018-01-07 – 2018-01-08 (×2): 100 mg via INTRAVENOUS
  Filled 2018-01-07 (×3): qty 100

## 2018-01-07 MED ORDER — ORAL CARE MOUTH RINSE
15.0000 mL | Freq: Two times a day (BID) | OROMUCOSAL | Status: DC
  Administered 2018-01-07 – 2018-01-09 (×4): 15 mL via OROMUCOSAL

## 2018-01-07 MED ORDER — ONDANSETRON HCL 4 MG/2ML IJ SOLN
4.0000 mg | Freq: Once | INTRAMUSCULAR | Status: AC
  Administered 2018-01-07: 4 mg via INTRAVENOUS
  Filled 2018-01-07: qty 2

## 2018-01-07 MED ORDER — METHYLPREDNISOLONE SODIUM SUCC 125 MG IJ SOLR
60.0000 mg | Freq: Three times a day (TID) | INTRAMUSCULAR | Status: DC
  Administered 2018-01-07 – 2018-01-09 (×6): 60 mg via INTRAVENOUS
  Filled 2018-01-07 (×6): qty 2

## 2018-01-07 NOTE — ED Provider Notes (Signed)
Physical Exam  BP 131/62   Pulse 81   Temp 98.1 F (36.7 C) (Oral)   Resp 18   SpO2 98%   Physical Exam Patient appears chronically ill, thin.  She is lying in the bed on BiPAP, groaning. The patient opens eyes to voice however she has nonsensical verbal responses and is not following commands. She has dry oral mucosa and is tender to palpation of the abdomen. Lung sounds are distant, heart with regular rate and rhythm.  ED Course/Procedures   Clinical Course as of Jan 07 1038  Thu Jan 07, 2018  0804 Urinalysis, Routine w reflex microscopic(!) [AH]  5366 Proteinuria present  Protein(!): 100 [AH]  0804 Patient VBG reviewed.  Her i-STAT ABG was presented to me.  Her pH is 7.226 with a CO2 greater than 91.  I-Stat venous blood gas, ED(!!) [AH]  0805 Patient's white count is elevated however this is likely acute phase reactant  WBC(!): 11.0 [AH]  0805 Potassium slightly elevated.  No change in GFR or creatinine.  Unsure if this is hemolysis.  No documented EKG and will repeat at this time.  Potassium(!): 5.5 [AH]  0805 Since urine positive for cocaine and benzodiazepines.  Rapid urine drug screen (hospital performed)(!) [AH]  1017 CT abdomen reviewed and there is no evidence of acute abnormalities.  CT ABDOMEN PELVIS W CONTRAST [AH]  1019 Patient repeat ABG shows a pH of 7.247 which is about the same as her earlier ABG she has been on BiPAP for about 3 hours now.  Her CO2    [AH]  1019 continues to be greater than 91 with a PO2 of 84.  Her mental status has not yet changed much however it is difficult to say if she is somnolent secondary to hypercarbia or benzodiazepine overdose.  The patient has been maintaining her airway throughout and is easily arousable with voice.  She does not appear to be decompensating in any way on the BiPAP and with underlying lung disease I have caution for intubating immediately.  We will consult critical care for admission to ICU.   [AH]    Clinical  Course User Index [AH] Margarita Mail, PA-C   Clinical Course as of Jan 07 1038  Thu Jan 07, 2018  0804 Urinalysis, Routine w reflex microscopic(!) [AH]  4403 Proteinuria present  Protein(!): 100 [AH]  0804 Patient VBG reviewed.  Her i-STAT ABG was presented to me.  Her pH is 7.226 with a CO2 greater than 91.  I-Stat venous blood gas, ED(!!) [AH]  0805 Patient's white count is elevated however this is likely acute phase reactant  WBC(!): 11.0 [AH]  0805 Potassium slightly elevated.  No change in GFR or creatinine.  Unsure if this is hemolysis.  No documented EKG and will repeat at this time.  Potassium(!): 5.5 [AH]  0805 Since urine positive for cocaine and benzodiazepines.  Rapid urine drug screen (hospital performed)(!) [AH]  1017 CT abdomen reviewed and there is no evidence of acute abnormalities.  CT ABDOMEN PELVIS W CONTRAST [AH]  1019 Patient repeat ABG shows a pH of 7.247 which is about the same as her earlier ABG she has been on BiPAP for about 3 hours now.  Her CO2    [AH]  1019 continues to be greater than 91 with a PO2 of 84.  Her mental status has not yet changed much however it is difficult to say if she is somnolent secondary to hypercarbia or benzodiazepine overdose.  The patient has  been maintaining her airway throughout and is easily arousable with voice.  She does not appear to be decompensating in any way on the BiPAP and with underlying lung disease I have caution for intubating immediately.  We will consult critical care for admission to ICU.   [AH]    Clinical Course User Index [AH] Margarita Mail, PA-C    Clinical Course as of Jan 07 1038  Thu Jan 07, 2018  0804 Urinalysis, Routine w reflex microscopic(!) [AH]  5277 Proteinuria present  Protein(!): 100 [AH]  0804 Patient VBG reviewed.  Her i-STAT ABG was presented to me.  Her pH is 7.226 with a CO2 greater than 91.  I-Stat venous blood gas, ED(!!) [AH]  0805 Patient's white count is elevated however this is  likely acute phase reactant  WBC(!): 11.0 [AH]  0805 Potassium slightly elevated.  No change in GFR or creatinine.  Unsure if this is hemolysis.  No documented EKG and will repeat at this time.  Potassium(!): 5.5 [AH]  0805 Since urine positive for cocaine and benzodiazepines.  Rapid urine drug screen (hospital performed)(!) [AH]  1017 CT abdomen reviewed and there is no evidence of acute abnormalities.  CT ABDOMEN PELVIS W CONTRAST [AH]  1019 Patient repeat ABG shows a pH of 7.247 which is about the same as her earlier ABG she has been on BiPAP for about 3 hours now.  Her CO2    [AH]  1019 continues to be greater than 91 with a PO2 of 84.  Her mental status has not yet changed much however it is difficult to say if she is somnolent secondary to hypercarbia or benzodiazepine overdose.  The patient has been maintaining her airway throughout and is easily arousable with voice.  She does not appear to be decompensating in any way on the BiPAP and with underlying lung disease I have caution for intubating immediately.  We will consult critical care for admission to ICU.   [AH]    Clinical Course User Index [AH] Margarita Mail, PA-C     Procedures   CRITICAL CARE Performed by: Margarita Mail Total critical care time: 30 minutes Critical care time was exclusive of separately billable procedures and treating other patients. Critical care was necessary to treat or prevent imminent or life-threatening deterioration. Critical care was time spent personally by me on the following activities: development of treatment plan with patient and/or surrogate as well as nursing, discussions with consultants, evaluation of patient's response to treatment, examination of patient, obtaining history from patient or surrogate, ordering and performing treatments and interventions, ordering and review of laboratory studies, ordering and review of radiographic studies, pulse oximetry and re-evaluation of patient's  condition.   MDM  10:39 AM BP 131/62   Pulse 81   Temp 98.1 F (36.7 C) (Oral)   Resp 18   SpO2 98%    Take in sign out from [place] This 72 year old female was found unresponsive.  She is on 3-4 L of home oxygen given chronically.  She presented to the emergency department with altered mental status.  She has been lethargic throughout her visit visit.  She does admit that she may have overdosed on alprazolam.  Her UDS is currently pending.  On reevaluation by Dr. Leonides Schanz patient began complaining of abdominal pain.  She has a history of CO2 narcosis.  Her i-STAT ABG came back with significant hypercarbia and a pH of 7.226.  She is currently on BiPAP.  Plan CT abd pelvis. Monitor mental status.  Patient with hypercarbia, no improvement.  She is awake and alert and does not appear to need intubation at this time.  Patient will be admitted to the intensivist service by Dr. Nelda Marseille.  She appears appropriate for admission.  Significant deterioration in her respiratory status.       Margarita Mail, PA-C 01/08/18 2115    Mesner, Corene Cornea, MD 01/11/18 [phone]

## 2018-01-07 NOTE — ED Notes (Signed)
Patient transported to CT 

## 2018-01-07 NOTE — ED Notes (Signed)
See Downtime notes/triage.

## 2018-01-07 NOTE — ED Notes (Signed)
Jasmine [name] [phone] Requests if there is any change to contact him via phone number.

## 2018-01-07 NOTE — ED Notes (Signed)
Downtime triage note: Per EMS, pt found in bathroom unresponsive. EMS placed pt back on O2 after being off during that time. Pupils are pinpoint. CBG 180. She is still lethargic but more awake than originally. Pt wears 4L [place] baseline.

## 2018-01-07 NOTE — H&P (Addendum)
Name: Jasmine [name] MRN: [id number] DOB: Jun 21, 1946    ADMISSION DATE:  01/07/2018 CONSULTATION DATE:  3/21  REFERRING MD :  EDP  CHIEF COMPLAINT:  Respiratory failure   BRIEF PATIENT DESCRIPTION: 72yo female smoker (54 pack years - quit cigarettes, still smoked crack) with hx polysubstance abuse (cocaine, benzos), COPD, CHF on 4L home O2 presented 3/21 with altered mental status after being found in the bathroom.  Pupils were pinpoint.  In ER found to be significantly hypercarbic with PCO2 in the 90s.  She was placed on BiPAP with some improvement in mental status.  PCCM consulted for assistance.  SIGNIFICANT EVENTS    STUDIES:  CT  Head 3/21>> neg acute  CT abd 3/21>>> neg acute   HISTORY OF PRESENT ILLNESS:  72yo female smoker with hx polysubstance abuse (cocaine, benzos), COPD, CHF on 4L home O2 presented 3/21 with altered mental status after being found in the bathroom.  Pupils were pinpoint.  In ER found to be significantly hypercarbic with PCO2 in the 90s.  She was placed on BiPAP with some improvement in mental status.  PCCM consulted for assistance.   PAST MEDICAL HISTORY :   has a past medical history of ALLERGIC RHINITIS (05/01/2007), Anxiety, Arthritis, ASTHMA (05/01/2007), Cancer (HCC), CHF (congestive heart failure) ([place]), Chronic bronchitis ([place]), COPD (chronic obstructive pulmonary disease) ([place]), DEPRESSION (07/21/2008), DVT (deep venous thrombosis) ([place]) (1960s), FATIGUE (10/10/2010), HYPERTENSION (05/01/2007), HYPERTHYROIDISM (11/23/2007), HYPOTHYROIDISM (08/23/2009), LUMBAR RADICULOPATHY, RIGHT (08/25/2008), On home oxygen therapy, OSTEOPOROSIS (05/01/2007), SHOULDER PAIN, LEFT (08/23/2009), and SINUSITIS, CHRONIC (10/10/2010).  has a past surgical history that includes Appendectomy; Ectopic pregnancy surgery ("late '60s or early '70s); Oophorectomy (Right); electrocardiogram (06/20/2006); and Dilation and curettage of uterus (multiple). Prior to Admission medications     Medication Sig Start Date End Date Taking? Authorizing Provider  albuterol (PROAIR HFA) 108 (90 Base) MCG/ACT inhaler Inhale 1-2 puffs into the lungs every 6 (six) hours as needed for wheezing or shortness of breath. 12/17/17   Rigoberto Noel, MD  albuterol (PROVENTIL) (2.5 MG/3ML) 0.083% nebulizer solution Take 3 mLs (2.5 mg total) by nebulization every 4 (four) hours as needed for wheezing. 06/03/17   Biagio Borg, MD  ALPRAZolam Duanne Moron) 0.25 MG tablet Take 1 tablet (0.25 mg total) by mouth 2 (two) times daily. 10/22/17   Biagio Borg, MD  aspirin EC 81 MG EC tablet Take 1 tablet (81 mg total) by mouth daily. 03/27/16   Velvet Bathe, MD  citalopram (CELEXA) 20 MG tablet TAKE 1 TABLET BY MOUTH DAILY 10/21/17   Biagio Borg, MD  feeding supplement, ENSURE ENLIVE, (ENSURE ENLIVE) LIQD Take 237 mLs by mouth 3 (three) times daily between meals. 12/31/15   Florencia Reasons, MD  fluticasone furoate-vilanterol (BREO ELLIPTA) 200-25 MCG/INH AEPB Inhale 1 puff into the lungs daily. 12/17/17   Rigoberto Noel, MD  furosemide (LASIX) 40 MG tablet Take 1 tablet (40 mg total) by mouth 2 (two) times daily. 06/03/17   Biagio Borg, MD  levothyroxine (SYNTHROID, LEVOTHROID) 75 MCG tablet TAKE 1 TABLET BY MOUTH DAILY BEFORE BREAKFAST. 12/14/17   Biagio Borg, MD  OXYGEN Inhale 4 L into the lungs continuous.     [provider]  potassium chloride (K-DUR) 10 MEQ tablet Take 1 tablet (10 mEq total) by mouth 2 (two) times daily. 06/03/17   Biagio Borg, MD  tiotropium (SPIRIVA HANDIHALER) 18 MCG inhalation capsule Place 1 capsule (18 mcg total) into inhaler and inhale daily. 12/17/17  Rigoberto Noel, MD  triamcinolone (KENALOG) 0.025 % cream Apply 1 application topically 3 (three) times daily. As needed for itching 06/03/17   Biagio Borg, MD   Allergies  Allergen Reactions  . Alendronate Sodium Other (See Comments)    Pt does not remember this reaction    FAMILY HISTORY:  family history includes Alcohol abuse in her  brother; Asthma in her unknown relative; Diabetes in her brother; Hypertension in her other; Lung cancer in her father; Stroke in her mother; Thyroid disease in her mother. SOCIAL HISTORY:  reports that she quit smoking about 2 years ago. Her smoking use included cigarettes. She has a 54.00 pack-year smoking history. She has never used smokeless tobacco. She reports that she drinks alcohol. She reports that she has current or past drug history. Drug: "Crack" cocaine. Frequency: 2.00 times per week.  REVIEW OF SYSTEMS:   As per HPI - All other systems reviewed and were neg.    SUBJECTIVE:   VITAL SIGNS: Temp:  [98.1 F (36.7 C)] 98.1 F (36.7 C) (03/21 0345) Pulse Rate:  [77-92] 86 (03/21 1122) Resp:  [14-18] 16 (03/21 1122) BP: (106-134)/(55-67) 113/64 (03/21 1122) SpO2:  [93 %-100 %] 98 % (03/21 1122) FiO2 (%):  [40 %] 40 % (03/21 0754)  PHYSICAL EXAMINATION: General: Thin, chronically ill-appearing female, no acute distress on BiPAP Neuro: Drowsy but easily arousable, appropriate, oriented, moves all extremities HEENT: MM moist, BiPAP mask Cardiovascular: S1-S2 regular rate and rhythm Lungs: Respirations are even and nonlabored on BiPAP, diminished throughout Abdomen: Soft, mildly tender Musculoskeletal: Warm and dry, no edema  Recent Labs  Lab 01/07/18 0450  NA 141  K 5.5*  CL 91*  CO2 35*  BUN 11  CREATININE 0.78  GLUCOSE 150*   Recent Labs  Lab 01/07/18 0450  HGB 12.8  HCT 44.3  WBC 11.0*  PLT 146*   Dg Chest 1 View  Result Date: 01/07/2018 CLINICAL DATA:  Initial evaluation for acute trauma, fall, found unresponsive. EXAM: CHEST  1 VIEW COMPARISON:  Prior radiograph from 06/03/2017. FINDINGS: Cardiac and mediastinal silhouettes are stable in size and contour, and remain within normal limits. Aortic atherosclerosis. Lungs are hyperinflated with underlying emphysematous changes. Linear scarring along the right minor fissure is stable from previous. Mild diffuse  vascular congestion with interstitial prominence suggestive of mild interstitial congestion. No frank pulmonary edema. No consolidative airspace opacity. No pneumothorax. No pleural effusion. No acute osseous abnormality. IMPRESSION: 1. Hyperinflation with COPD. Diffusely increased prominence of the interstitial markings suggestive of superimposed mild pulmonary interstitial congestion/edema. 2. Aortic atherosclerosis. Electronically Signed   By: Jeannine Boga M.D.   On: 01/07/2018 06:02   Ct Head Wo Contrast  Result Date: 01/07/2018 CLINICAL DATA:  Head trauma, ataxia. Found unresponsive in bathroom. EXAM: CT HEAD WITHOUT CONTRAST CT CERVICAL SPINE WITHOUT CONTRAST TECHNIQUE: Multidetector CT imaging of the head and cervical spine was performed following the standard protocol without intravenous contrast. Multiplanar CT image reconstructions of the cervical spine were also generated. COMPARISON:  None. FINDINGS: CT HEAD FINDINGS Brain: Brain volume is normal for age. No intracranial hemorrhage, mass effect, or midline shift. No hydrocephalus. The basilar cisterns are patent. No evidence of territorial infarct or acute ischemia. No extra-axial or intracranial fluid collection. Vascular: Atherosclerosis of skullbase vasculature without hyperdense vessel or abnormal calcification. Skull: No fracture or focal lesion. Sinuses/Orbits: Paranasal sinuses and mastoid air cells are clear. The visualized orbits are unremarkable. Other: None. CT CERVICAL SPINE FINDINGS Alignment: Straightening of normal  lordosis. No traumatic subluxation. Skull base and vertebrae: No acute fracture. Vertebral body heights are maintained. The dens and skull base are intact. Soft tissues and spinal canal: No prevertebral fluid or swelling. No visible canal hematoma. Disc levels: Disc space narrowing and endplate spurring W2-N5 and C5-C6. Endplate spurring at A2-Z3 and C6-C7 with relative preservation of disc spaces. Upper chest:  Emphysema.  No acute finding. Other: None. IMPRESSION: 1. Unremarkable noncontrast head CT for age. 2. Mild degenerative change in the cervical spine without acute fracture or subluxation. Electronically Signed   By: Jeb Levering M.D.   On: 01/07/2018 05:57   Ct Cervical Spine Wo Contrast  Result Date: 01/07/2018 CLINICAL DATA:  Head trauma, ataxia. Found unresponsive in bathroom. EXAM: CT HEAD WITHOUT CONTRAST CT CERVICAL SPINE WITHOUT CONTRAST TECHNIQUE: Multidetector CT imaging of the head and cervical spine was performed following the standard protocol without intravenous contrast. Multiplanar CT image reconstructions of the cervical spine were also generated. COMPARISON:  None. FINDINGS: CT HEAD FINDINGS Brain: Brain volume is normal for age. No intracranial hemorrhage, mass effect, or midline shift. No hydrocephalus. The basilar cisterns are patent. No evidence of territorial infarct or acute ischemia. No extra-axial or intracranial fluid collection. Vascular: Atherosclerosis of skullbase vasculature without hyperdense vessel or abnormal calcification. Skull: No fracture or focal lesion. Sinuses/Orbits: Paranasal sinuses and mastoid air cells are clear. The visualized orbits are unremarkable. Other: None. CT CERVICAL SPINE FINDINGS Alignment: Straightening of normal lordosis. No traumatic subluxation. Skull base and vertebrae: No acute fracture. Vertebral body heights are maintained. The dens and skull base are intact. Soft tissues and spinal canal: No prevertebral fluid or swelling. No visible canal hematoma. Disc levels: Disc space narrowing and endplate spurring Y8-M5 and C5-C6. Endplate spurring at H8-I6 and C6-C7 with relative preservation of disc spaces. Upper chest: Emphysema.  No acute finding. Other: None. IMPRESSION: 1. Unremarkable noncontrast head CT for age. 2. Mild degenerative change in the cervical spine without acute fracture or subluxation. Electronically Signed   By: Jeb Levering  M.D.   On: 01/07/2018 05:57   Ct Abdomen Pelvis W Contrast  Result Date: 01/07/2018 CLINICAL DATA:  Diffuse abdominal pain EXAM: CT ABDOMEN AND PELVIS WITH CONTRAST TECHNIQUE: Multidetector CT imaging of the abdomen and pelvis was performed using the standard protocol following bolus administration of intravenous contrast. CONTRAST:  32mL ISOVUE-300 IOPAMIDOL (ISOVUE-300) INJECTION 61% COMPARISON:  05/07/2016 FINDINGS: Lower chest: Some scarring is noted in the lower lobes bilaterally. No focal nodule is seen. Hepatobiliary: No focal liver abnormality is seen. No gallstones, gallbladder wall thickening, or biliary dilatation. Pancreas: Unremarkable. No pancreatic ductal dilatation or surrounding inflammatory changes. Spleen: Normal in size without focal abnormality. Adrenals/Urinary Tract: Adrenal glands are within normal limits bilaterally. Kidneys demonstrate a normal enhancement pattern. Right renal cyst is noted inferiorly. This is stable from the prior exam. No obstructive changes are noted. Stomach/Bowel: The appendix is not visualized consistent with prior surgical history. No obstructive or inflammatory changes are seen. Vascular/Lymphatic: Aortic atherosclerosis. No enlarged abdominal or pelvic lymph nodes. Reproductive: 15 mm hyperdense focus is noted in the uterus most consistent with a small fibroid. No other uterine or ovarian abnormality is seen. Other: No abdominal wall hernia or abnormality. No abdominopelvic ascites. Musculoskeletal: Degenerative changes noted without acute abnormality. IMPRESSION: Uterine fibroid. No acute abnormality noted. Electronically Signed   By: Inez Catalina M.D.   On: 01/07/2018 09:58    ASSESSMENT / PLAN:  Acute on chronic hypercarbic and hypoxic respiratory failure- multifactorial in  the setting of severe COPD with acute exacerbation and crack cocaine abuse comp located by mild pulmonary edema COPD  PLAN -  Continue bipap  F/u CXR  IV steroids  BD's -  duonebs q6h  Hold home breo, spiriva  Doxycycline  Pulmonary hygiene  Continue home PRN xanax once more awake  ABG in am   Acute on chronic CHF Hx HTN  PLAN-  Lasix 40mg  IV x 1  F/u chem  Hold home PO lasix for now  Trend troponin   Hypothyroidism  PLAN -  Continue home synthroid    Discussed at bedside with pt and ex-husband (POA).  She is unsure if she would want to be intubated should she deteriorate.  Ex-husband feels that she would not want this and is just scared and still a bit confused right now.  He will talk with her sisters.  For now she is stable on bipap.    Nickolas Madrid, NP 01/07/2018  11:48 AM Pager: [phone] or [phone]  Attending Note:  72 year old female with cocaine and benzos abuse who presents with acute on chronic hypercarbic and hypoxemic respiratory failure.  Patient was started on BiPAP and PCCM was called on consultation to admit.  On exam, distant BS diffusely with bibasilar crackles.  I reviewed CXR myself, pulmonary edema and severe hyperinflation noted.  FEV1 of 0.4 in 2014 and patient continues to smoke cocaine.  She is too confused to have a discussion regarding code status.  Patient has no insight into her disease process.  Has no POA.  Ex-husband bedside and she would like for him to be her POA.  He informs me that she would not want intubation...etc. But he would like to speak with her sisters.  She remains full code for now.  Doxy for COPD exacerbation.  Steroids as ordered.  Diureses.  Admit to the ICU.  PCCM will continue to follow.  The patient is critically ill with multiple organ systems failure and requires high complexity decision making for assessment and support, frequent evaluation and titration of therapies, application of advanced monitoring technologies and extensive interpretation of multiple databases.   Critical Care Time devoted to patient care services described in this note is  35  Minutes. This time reflects time of  care of this signee Dr Jennet Maduro. This critical care time does not reflect procedure time, or teaching time or supervisory time of PA/NP/Med student/Med Resident etc but could involve care discussion time.  Rush Farmer, M.D. [facility] Pulmonary/Critical Care Medicine. Pager: [phone]. After hours pager: [phone].

## 2018-01-07 NOTE — ED Provider Notes (Signed)
[facility] EMERGENCY DEPARTMENT Provider Note   CSN: [id number] Arrival date & time: 01/07/18  0156     History   Chief Complaint No chief complaint on file.   LEVEL 5 CAVEAT 2/2 ALTERED MENTAL STATUS: LETHARGY  HPI Jasmine [name] is a 72 y.o. female.   73 year old female with history of hypertension, CHF, chronic bronchitis (on 3-4 L home oxygen chronically) presents to the emergency department for altered mental status.  Per EMS, patient was found unresponsive on the floor of the bathroom.  She was not on her oxygen at this time.  She was placed back on supplemental oxygen by EMS.  Patient very lethargic on arrival with difficulty contributing to history.  She does allude to concern for possible overdose, stating that she may have taken too much alprazolam.  She denies any additional complaints at this time.  Difficult to obtain answers to other questions secondary to lethargy.     Past Medical History:  Diagnosis Date  . ALLERGIC RHINITIS 05/01/2007  . Anxiety   . Arthritis    "hands" (12/28/2015)  . ASTHMA 05/01/2007   "since I was a child"  . Cancer (HCC)    LUNG  . CHF (congestive heart failure) ([place])   . Chronic bronchitis ([place])   . COPD (chronic obstructive pulmonary disease) ([place])   . DEPRESSION 07/21/2008  . DVT (deep venous thrombosis) (HCC) 1960s   LLE  . FATIGUE 10/10/2010  . HYPERTENSION 05/01/2007  . HYPERTHYROIDISM 11/23/2007   Pt endorses having had Graves disease, possibly radioactive iodine x1, but no thyroidectomy  . HYPOTHYROIDISM 08/23/2009  . LUMBAR RADICULOPATHY, RIGHT 08/25/2008  . On home oxygen therapy    "3-4L; qd; all the time" (12/28/2015)  . OSTEOPOROSIS 05/01/2007  . SHOULDER PAIN, LEFT 08/23/2009  . SINUSITIS, CHRONIC 10/10/2010    Patient Active Problem List   Diagnosis Date Noted  . Aspiration into airway 06/03/2017  . Insect bite 07/12/2016  . Rash 07/12/2016  . Bilateral hearing loss 06/05/2016  . Hyperglycemia  05/21/2016  . Abnormal LFTs 03/24/2016  . Elevated blood sugar 03/24/2016  . Solitary pulmonary nodule 01/09/2016  . Chronic respiratory failure with hypoxia ([facility])   . On home oxygen therapy   . Chronic diastolic congestive heart failure ([place])   . Staphylococcus aureus bacteremia 12/28/2015  . Bacteremia Step viridans 12/27/2015  . Tobacco abuse disorder 12/25/2015  . Hypothyroidism following radioiodine therapy 09/26/2014  . Protein-calorie malnutrition, severe ([place]) 06/23/2014  . Cor pulmonale ([place]) 01/12/2013  . COPD (chronic obstructive pulmonary disease) ([place]) 11/29/2011  . Macrocytosis without anemia 11/09/2011  . SINUSITIS, CHRONIC 10/10/2010  . FATIGUE 10/10/2010  . Depression with anxiety 07/21/2008  . Essential hypertension 05/01/2007  . ALLERGIC RHINITIS 05/01/2007  . ASTHMA 05/01/2007  . OSTEOPOROSIS 05/01/2007    Past Surgical History:  Procedure Laterality Date  . APPENDECTOMY    . DILATION AND CURETTAGE OF UTERUS  multiple   history of multiple dialations and curettages and miscarriages, unfortunately never carrying a child to term  . ECTOPIC PREGNANCY SURGERY  "late '60s or early '70s  . ELECTROCARDIOGRAM  06/20/2006  . OOPHORECTOMY Right     OB History    Gravida  5   Para      Term      Preterm      AB  5   Living        SAB  4   TAB      Ectopic  1  Multiple      Live Births               Home Medications    Prior to Admission medications   Medication Sig Start Date End Date Taking? Authorizing Provider  albuterol (PROAIR HFA) 108 (90 Base) MCG/ACT inhaler Inhale 1-2 puffs into the lungs every 6 (six) hours as needed for wheezing or shortness of breath. 12/17/17   Rigoberto Noel, MD  albuterol (PROVENTIL) (2.5 MG/3ML) 0.083% nebulizer solution Take 3 mLs (2.5 mg total) by nebulization every 4 (four) hours as needed for wheezing. 06/03/17   Biagio Borg, MD  ALPRAZolam Duanne Moron) 0.25 MG tablet Take 1 tablet (0.25 mg total) by mouth 2  (two) times daily. 10/22/17   Biagio Borg, MD  aspirin EC 81 MG EC tablet Take 1 tablet (81 mg total) by mouth daily. 03/27/16   Velvet Bathe, MD  citalopram (CELEXA) 20 MG tablet TAKE 1 TABLET BY MOUTH DAILY 10/21/17   Biagio Borg, MD  feeding supplement, ENSURE ENLIVE, (ENSURE ENLIVE) LIQD Take 237 mLs by mouth 3 (three) times daily between meals. 12/31/15   Florencia Reasons, MD  fluticasone furoate-vilanterol (BREO ELLIPTA) 200-25 MCG/INH AEPB Inhale 1 puff into the lungs daily. 12/17/17   Rigoberto Noel, MD  furosemide (LASIX) 40 MG tablet Take 1 tablet (40 mg total) by mouth 2 (two) times daily. 06/03/17   Biagio Borg, MD  levothyroxine (SYNTHROID, LEVOTHROID) 75 MCG tablet TAKE 1 TABLET BY MOUTH DAILY BEFORE BREAKFAST. 12/14/17   Biagio Borg, MD  OXYGEN Inhale 4 L into the lungs continuous.     [provider]  potassium chloride (K-DUR) 10 MEQ tablet Take 1 tablet (10 mEq total) by mouth 2 (two) times daily. 06/03/17   Biagio Borg, MD  tiotropium (SPIRIVA HANDIHALER) 18 MCG inhalation capsule Place 1 capsule (18 mcg total) into inhaler and inhale daily. 12/17/17   Rigoberto Noel, MD  triamcinolone (KENALOG) 0.025 % cream Apply 1 application topically 3 (three) times daily. As needed for itching 06/03/17   Biagio Borg, MD    Family History Family History  Problem Relation Age of Onset  . Lung cancer Father   . Alcohol abuse Brother   . Diabetes Brother   . Hypertension Other   . Stroke Mother   . Thyroid disease Mother   . Asthma Unknown        maternal aunts    Social History Social History   Tobacco Use  . Smoking status: Former Smoker    Packs/day: 1.00    Years: 54.00    Pack years: 54.00    Types: Cigarettes    Last attempt to quit: 07/21/2015    Years since quitting: 2.4  . Smokeless tobacco: Never Used  Substance Use Topics  . Alcohol use: Yes    Comment: 1-4 beers per month   . Drug use: Yes    Frequency: 2.0 times per week    Types: "Crack" cocaine     Comment: 12/28/2015 "sometimes weekends and holidays"     Allergies   Alendronate sodium   Review of Systems Review of Systems  Unable to perform ROS: Mental status change  Patient lethargic   Physical Exam Updated Vital Signs BP 121/60   Pulse 80   Temp 98.1 F (36.7 C) (Oral)   Resp 17   SpO2 100%   Physical Exam  Constitutional: She is oriented to person, place, and time. She appears well-developed  and well-nourished. No distress.  HENT:  Head: Normocephalic and atraumatic.  Foam around mouth, but clear oropharynx  Eyes: Conjunctivae and EOM are normal. No scleral icterus.  Neck:  C-collar in place.  Cardiovascular: Normal rate, regular rhythm and intact distal pulses.  Pulmonary/Chest: Effort normal. No stridor. No respiratory distress.  Faint expiratory wheeze, scattered. Otherwise clear lungs. Good O2 sat on chronic oxygen.  Abdominal: Soft. She exhibits no distension. There is no tenderness.  Soft, nondistended, nontender.  Musculoskeletal: Normal range of motion.  Neurological: She is alert and oriented to person, place, and time. She exhibits normal muscle tone.  GCS 15. Lethargic, but arousable. Speech clear and answering questions appropriately. Moving extremities spontaneously.  Skin: Skin is warm and dry. No rash noted. She is not diaphoretic. No erythema. No pallor.  Psychiatric: She has a normal mood and affect. Her behavior is normal.  Nursing note and vitals reviewed.    ED Treatments / Results  Labs (all labs ordered are listed, but only abnormal results are displayed) Labs Reviewed  CBC WITH DIFFERENTIAL/PLATELET - Abnormal; Notable for the following components:      Result Value   WBC 11.0 (*)    MCV 106.0 (*)    MCHC 28.9 (*)    Platelets 146 (*)    Neutro Abs 9.4 (*)    Lymphs Abs 0.5 (*)    Monocytes Absolute 1.1 (*)    All other components within normal limits  COMPREHENSIVE METABOLIC PANEL - Abnormal; Notable for the following  components:   Potassium 5.5 (*)    Chloride 91 (*)    CO2 35 (*)    Glucose, Bld 150 (*)    All other components within normal limits  I-STAT VENOUS BLOOD GAS, ED - Abnormal; Notable for the following components:   pCO2, Ven 87.8 (*)    pO2, Ven 24.0 (*)    Bicarbonate 45.3 (*)    TCO2 48 (*)    Acid-Base Excess 14.0 (*)    All other components within normal limits  ETHANOL  RAPID URINE DRUG SCREEN, HOSP PERFORMED  URINALYSIS, ROUTINE W REFLEX MICROSCOPIC  I-STAT TROPONIN, ED  I-STAT ARTERIAL BLOOD GAS, ED    EKG  EKG Interpretation None       Radiology Dg Chest 1 View  Result Date: 01/07/2018 CLINICAL DATA:  Initial evaluation for acute trauma, fall, found unresponsive. EXAM: CHEST  1 VIEW COMPARISON:  Prior radiograph from 06/03/2017. FINDINGS: Cardiac and mediastinal silhouettes are stable in size and contour, and remain within normal limits. Aortic atherosclerosis. Lungs are hyperinflated with underlying emphysematous changes. Linear scarring along the right minor fissure is stable from previous. Mild diffuse vascular congestion with interstitial prominence suggestive of mild interstitial congestion. No frank pulmonary edema. No consolidative airspace opacity. No pneumothorax. No pleural effusion. No acute osseous abnormality. IMPRESSION: 1. Hyperinflation with COPD. Diffusely increased prominence of the interstitial markings suggestive of superimposed mild pulmonary interstitial congestion/edema. 2. Aortic atherosclerosis. Electronically Signed   By: Jeannine Boga M.D.   On: 01/07/2018 06:02   Ct Head Wo Contrast  Result Date: 01/07/2018 CLINICAL DATA:  Head trauma, ataxia. Found unresponsive in bathroom. EXAM: CT HEAD WITHOUT CONTRAST CT CERVICAL SPINE WITHOUT CONTRAST TECHNIQUE: Multidetector CT imaging of the head and cervical spine was performed following the standard protocol without intravenous contrast. Multiplanar CT image reconstructions of the cervical spine  were also generated. COMPARISON:  None. FINDINGS: CT HEAD FINDINGS Brain: Brain volume is normal for age. No intracranial hemorrhage, mass effect,  or midline shift. No hydrocephalus. The basilar cisterns are patent. No evidence of territorial infarct or acute ischemia. No extra-axial or intracranial fluid collection. Vascular: Atherosclerosis of skullbase vasculature without hyperdense vessel or abnormal calcification. Skull: No fracture or focal lesion. Sinuses/Orbits: Paranasal sinuses and mastoid air cells are clear. The visualized orbits are unremarkable. Other: None. CT CERVICAL SPINE FINDINGS Alignment: Straightening of normal lordosis. No traumatic subluxation. Skull base and vertebrae: No acute fracture. Vertebral body heights are maintained. The dens and skull base are intact. Soft tissues and spinal canal: No prevertebral fluid or swelling. No visible canal hematoma. Disc levels: Disc space narrowing and endplate spurring G2-I9 and C5-C6. Endplate spurring at S8-N4 and C6-C7 with relative preservation of disc spaces. Upper chest: Emphysema.  No acute finding. Other: None. IMPRESSION: 1. Unremarkable noncontrast head CT for age. 2. Mild degenerative change in the cervical spine without acute fracture or subluxation. Electronically Signed   By: Jeb Levering M.D.   On: 01/07/2018 05:57   Ct Cervical Spine Wo Contrast  Result Date: 01/07/2018 CLINICAL DATA:  Head trauma, ataxia. Found unresponsive in bathroom. EXAM: CT HEAD WITHOUT CONTRAST CT CERVICAL SPINE WITHOUT CONTRAST TECHNIQUE: Multidetector CT imaging of the head and cervical spine was performed following the standard protocol without intravenous contrast. Multiplanar CT image reconstructions of the cervical spine were also generated. COMPARISON:  None. FINDINGS: CT HEAD FINDINGS Brain: Brain volume is normal for age. No intracranial hemorrhage, mass effect, or midline shift. No hydrocephalus. The basilar cisterns are patent. No evidence of  territorial infarct or acute ischemia. No extra-axial or intracranial fluid collection. Vascular: Atherosclerosis of skullbase vasculature without hyperdense vessel or abnormal calcification. Skull: No fracture or focal lesion. Sinuses/Orbits: Paranasal sinuses and mastoid air cells are clear. The visualized orbits are unremarkable. Other: None. CT CERVICAL SPINE FINDINGS Alignment: Straightening of normal lordosis. No traumatic subluxation. Skull base and vertebrae: No acute fracture. Vertebral body heights are maintained. The dens and skull base are intact. Soft tissues and spinal canal: No prevertebral fluid or swelling. No visible canal hematoma. Disc levels: Disc space narrowing and endplate spurring O2-V0 and C5-C6. Endplate spurring at J5-K0 and C6-C7 with relative preservation of disc spaces. Upper chest: Emphysema.  No acute finding. Other: None. IMPRESSION: 1. Unremarkable noncontrast head CT for age. 2. Mild degenerative change in the cervical spine without acute fracture or subluxation. Electronically Signed   By: Jeb Levering M.D.   On: 01/07/2018 05:57    Procedures Procedures (including critical care time)  Medications Ordered in ED Medications  sodium chloride 0.9 % bolus 500 mL (has no administration in time range)     Initial Impression / Assessment and Plan / ED Course  I have reviewed the triage vital signs and the nursing notes.  Pertinent labs & imaging results that were available during my care of the patient were reviewed by me and considered in my medical decision making (see chart for details).     5:00 AM Patient presenting after being found at home unresponsive.  EMS responded and place patient back on her home oxygen.  During my encounter with her she expresses concern for overdose on Xanax.  No specific evidence of traumatic injury on my assessment.  C-collar placed in the field.  6:47 AM Patient has been evaluated by MD who reports that patient is now  complaining of abdominal pain with generalized tenderness to palpation.  No focality.  She continues to have slurred speech, mumbling.  Unclear cause of altered mental status  at this time, but workup has been generally reassuring.  Plan to obtain ABG in light of recent VBG findings, though these may be chronic given COPD history and chronic oxygen requirement.  CT abdomen pelvis ordered to evaluate abdominal pain.  Awaiting urinalysis results and UDS.  Patient signed out to Margarita Mail, PA-C who will assume care and disposition appropriately.   Final Clinical Impressions(s) / ED Diagnoses   Final diagnoses:  Altered mental status, unspecified altered mental status type    ED Discharge Orders    None       Antonietta Breach, PA-C 01/07/18 [place], Delice Bison, DO 01/07/18 0715    Ward, Delice Bison, DO 01/07/18 [phone]

## 2018-01-08 ENCOUNTER — Inpatient Hospital Stay (HOSPITAL_COMMUNITY): Payer: Medicare HMO

## 2018-01-08 DIAGNOSIS — J9621 Acute and chronic respiratory failure with hypoxia: Secondary | ICD-10-CM

## 2018-01-08 LAB — BLOOD GAS, ARTERIAL
ACID-BASE EXCESS: 22.5 mmol/L — AB (ref 0.0–2.0)
Bicarbonate: 49.6 mmol/L — ABNORMAL HIGH (ref 20.0–28.0)
Delivery systems: POSITIVE
Drawn by: 39898
EXPIRATORY PAP: 8
FIO2: 40
INSPIRATORY PAP: 16
LHR: 8 {breaths}/min
O2 SAT: 97.3 %
PATIENT TEMPERATURE: 98.6
PCO2 ART: 95.8 mmHg — AB (ref 32.0–48.0)
PH ART: 7.334 — AB (ref 7.350–7.450)
PO2 ART: 99 mmHg (ref 83.0–108.0)

## 2018-01-08 LAB — BASIC METABOLIC PANEL
ANION GAP: 14 (ref 5–15)
BUN: 17 mg/dL (ref 6–20)
CALCIUM: 9.5 mg/dL (ref 8.9–10.3)
CO2: 38 mmol/L — ABNORMAL HIGH (ref 22–32)
Chloride: 90 mmol/L — ABNORMAL LOW (ref 101–111)
Creatinine, Ser: 0.79 mg/dL (ref 0.44–1.00)
GLUCOSE: 137 mg/dL — AB (ref 65–99)
Potassium: 4.4 mmol/L (ref 3.5–5.1)
Sodium: 142 mmol/L (ref 135–145)

## 2018-01-08 LAB — CBC
HCT: 41.2 % (ref 36.0–46.0)
Hemoglobin: 12.2 g/dL (ref 12.0–15.0)
MCH: 31 pg (ref 26.0–34.0)
MCHC: 29.6 g/dL — ABNORMAL LOW (ref 30.0–36.0)
MCV: 104.6 fL — AB (ref 78.0–100.0)
PLATELETS: 154 10*3/uL (ref 150–400)
RBC: 3.94 MIL/uL (ref 3.87–5.11)
RDW: 14.9 % (ref 11.5–15.5)
WBC: 5 10*3/uL (ref 4.0–10.5)

## 2018-01-08 LAB — TROPONIN I

## 2018-01-08 MED ORDER — IPRATROPIUM-ALBUTEROL 0.5-2.5 (3) MG/3ML IN SOLN
3.0000 mL | Freq: Three times a day (TID) | RESPIRATORY_TRACT | Status: DC
  Administered 2018-01-08 – 2018-01-09 (×3): 3 mL via RESPIRATORY_TRACT
  Filled 2018-01-08 (×4): qty 3

## 2018-01-08 MED ORDER — DOXYCYCLINE HYCLATE 100 MG PO TABS
100.0000 mg | ORAL_TABLET | Freq: Two times a day (BID) | ORAL | Status: DC
  Administered 2018-01-08 – 2018-01-09 (×3): 100 mg via ORAL
  Filled 2018-01-08 (×3): qty 1

## 2018-01-08 MED ORDER — PANTOPRAZOLE SODIUM 40 MG PO TBEC
40.0000 mg | DELAYED_RELEASE_TABLET | Freq: Every day | ORAL | Status: DC
  Administered 2018-01-08 – 2018-01-09 (×2): 40 mg via ORAL
  Filled 2018-01-08 (×2): qty 1

## 2018-01-08 NOTE — Progress Notes (Signed)
Pt admitted to the unit as a transfer from [facility]. Pt A&O x4; pt oriented to the unit and room; fall/safety precaution and prevention education completed with pt. VSS;  Pt voices understanding and denies any questions. Pt MAE x4; pt remains on 4L oxygen [place]; pt in bed with call light within reach and bed alarm on. Will continue to closely monitor pt. Delia Heady RN

## 2018-01-08 NOTE — Progress Notes (Signed)
CRITICAL VALUE ALERT  Critical Value:  PCO2 95.8  Date & Time Notied:  01/08/18 6:03 AM  Provider Notified: elink  Orders Received/Actions taken: pt remain on bipap

## 2018-01-08 NOTE — Progress Notes (Signed)
Name: Jasmine [name] MRN: [id number] DOB: 10-16-1946    ADMISSION DATE:  01/07/2018 CONSULTATION DATE:  01/07/2018  REFERRING MD :  Antonietta Breach, ER  CHIEF COMPLAINT:  Respiratory failure   BRIEF PATIENT DESCRIPTION:  72 yo female smoker found unresponsive at home.  UDS positive for benzo's, cocaine.  PMHx of COPD on home oxygen, Anxiety, DVT, Depression, HTN, Hypothyroidism, Chronic pain.  SUBJECTIVE:  Feels better.  Denies chest pain, cough, wheeze.  VITAL SIGNS: BP 118/69   Pulse 67   Temp 97.8 F (36.6 C) (Oral)   Resp 18   Ht 5' (1.524 m)   Wt 108 lb 3.9 oz (49.1 kg)   SpO2 99%   BMI 21.14 kg/m   INTAKE/OUTPUT: I/O last 3 completed shifts: In: 1000 [IV Piggyback:1000] Out: 600 [Urine:600]  PHYSICAL EXAMINATION:  General - pleasant Eyes - pupils reactive ENT - no sinus tenderness, no oral exudate, no LAN Cardiac - regular, no murmur Chest - no wheeze, rales Abd - soft, non tender Ext - no edema Skin - no rashes Neuro - normal strength Psych - normal mood  Labs: CBC Recent Labs    01/07/18 0450 01/07/18 1245 01/08/18 0507  WBC 11.0* 6.8 5.0  HGB 12.8 12.4 12.2  HCT 44.3 42.8 41.2  PLT 146* 162 154    Coag's No results for input(s): APTT, INR in the last 72 hours.  BMET Recent Labs    01/07/18 0450 01/07/18 1245 01/08/18 0507  NA 141  --  142  K 5.5*  --  4.4  CL 91*  --  90*  CO2 35*  --  38*  BUN 11  --  17  CREATININE 0.78 0.73 0.79  GLUCOSE 150*  --  137*    Electrolytes Recent Labs    01/07/18 0450 01/08/18 0507  CALCIUM 9.4 9.5    Sepsis Markers No results for input(s): PROCALCITON, O2SATVEN in the last 72 hours.  Invalid input(s): LACTICACIDVEN  ABG Recent Labs    01/08/18 0500  PHART 7.334*  PCO2ART 95.8*  PO2ART 99.0    Liver Enzymes Recent Labs    01/07/18 0450  AST 28  ALT 14  ALKPHOS 65  BILITOT 0.7  ALBUMIN 4.0    Cardiac Enzymes Recent Labs    01/07/18 1245 01/07/18 1709 01/08/18 0008    TROPONINI <0.03 <0.03 <0.03    Glucose Recent Labs    01/07/18 1413  GLUCAP 99    Imaging Dg Chest 1 View  Result Date: 01/07/2018 CLINICAL DATA:  Initial evaluation for acute trauma, fall, found unresponsive. EXAM: CHEST  1 VIEW COMPARISON:  Prior radiograph from 06/03/2017. FINDINGS: Cardiac and mediastinal silhouettes are stable in size and contour, and remain within normal limits. Aortic atherosclerosis. Lungs are hyperinflated with underlying emphysematous changes. Linear scarring along the right minor fissure is stable from previous. Mild diffuse vascular congestion with interstitial prominence suggestive of mild interstitial congestion. No frank pulmonary edema. No consolidative airspace opacity. No pneumothorax. No pleural effusion. No acute osseous abnormality. IMPRESSION: 1. Hyperinflation with COPD. Diffusely increased prominence of the interstitial markings suggestive of superimposed mild pulmonary interstitial congestion/edema. 2. Aortic atherosclerosis. Electronically Signed   By: Jeannine Boga M.D.   On: 01/07/2018 06:02   Ct Head Wo Contrast  Result Date: 01/07/2018 CLINICAL DATA:  Head trauma, ataxia. Found unresponsive in bathroom. EXAM: CT HEAD WITHOUT CONTRAST CT CERVICAL SPINE WITHOUT CONTRAST TECHNIQUE: Multidetector CT imaging of the head and cervical spine was performed following the standard protocol  without intravenous contrast. Multiplanar CT image reconstructions of the cervical spine were also generated. COMPARISON:  None. FINDINGS: CT HEAD FINDINGS Brain: Brain volume is normal for age. No intracranial hemorrhage, mass effect, or midline shift. No hydrocephalus. The basilar cisterns are patent. No evidence of territorial infarct or acute ischemia. No extra-axial or intracranial fluid collection. Vascular: Atherosclerosis of skullbase vasculature without hyperdense vessel or abnormal calcification. Skull: No fracture or focal lesion. Sinuses/Orbits: Paranasal  sinuses and mastoid air cells are clear. The visualized orbits are unremarkable. Other: None. CT CERVICAL SPINE FINDINGS Alignment: Straightening of normal lordosis. No traumatic subluxation. Skull base and vertebrae: No acute fracture. Vertebral body heights are maintained. The dens and skull base are intact. Soft tissues and spinal canal: No prevertebral fluid or swelling. No visible canal hematoma. Disc levels: Disc space narrowing and endplate spurring U2-G2 and C5-C6. Endplate spurring at R4-Y7 and C6-C7 with relative preservation of disc spaces. Upper chest: Emphysema.  No acute finding. Other: None. IMPRESSION: 1. Unremarkable noncontrast head CT for age. 2. Mild degenerative change in the cervical spine without acute fracture or subluxation. Electronically Signed   By: Jeb Levering M.D.   On: 01/07/2018 05:57   Ct Cervical Spine Wo Contrast  Result Date: 01/07/2018 CLINICAL DATA:  Head trauma, ataxia. Found unresponsive in bathroom. EXAM: CT HEAD WITHOUT CONTRAST CT CERVICAL SPINE WITHOUT CONTRAST TECHNIQUE: Multidetector CT imaging of the head and cervical spine was performed following the standard protocol without intravenous contrast. Multiplanar CT image reconstructions of the cervical spine were also generated. COMPARISON:  None. FINDINGS: CT HEAD FINDINGS Brain: Brain volume is normal for age. No intracranial hemorrhage, mass effect, or midline shift. No hydrocephalus. The basilar cisterns are patent. No evidence of territorial infarct or acute ischemia. No extra-axial or intracranial fluid collection. Vascular: Atherosclerosis of skullbase vasculature without hyperdense vessel or abnormal calcification. Skull: No fracture or focal lesion. Sinuses/Orbits: Paranasal sinuses and mastoid air cells are clear. The visualized orbits are unremarkable. Other: None. CT CERVICAL SPINE FINDINGS Alignment: Straightening of normal lordosis. No traumatic subluxation. Skull base and vertebrae: No acute  fracture. Vertebral body heights are maintained. The dens and skull base are intact. Soft tissues and spinal canal: No prevertebral fluid or swelling. No visible canal hematoma. Disc levels: Disc space narrowing and endplate spurring C6-C3 and C5-C6. Endplate spurring at J6-E8 and C6-C7 with relative preservation of disc spaces. Upper chest: Emphysema.  No acute finding. Other: None. IMPRESSION: 1. Unremarkable noncontrast head CT for age. 2. Mild degenerative change in the cervical spine without acute fracture or subluxation. Electronically Signed   By: Jeb Levering M.D.   On: 01/07/2018 05:57   Ct Abdomen Pelvis W Contrast  Result Date: 01/07/2018 CLINICAL DATA:  Diffuse abdominal pain EXAM: CT ABDOMEN AND PELVIS WITH CONTRAST TECHNIQUE: Multidetector CT imaging of the abdomen and pelvis was performed using the standard protocol following bolus administration of intravenous contrast. CONTRAST:  57mL ISOVUE-300 IOPAMIDOL (ISOVUE-300) INJECTION 61% COMPARISON:  05/07/2016 FINDINGS: Lower chest: Some scarring is noted in the lower lobes bilaterally. No focal nodule is seen. Hepatobiliary: No focal liver abnormality is seen. No gallstones, gallbladder wall thickening, or biliary dilatation. Pancreas: Unremarkable. No pancreatic ductal dilatation or surrounding inflammatory changes. Spleen: Normal in size without focal abnormality. Adrenals/Urinary Tract: Adrenal glands are within normal limits bilaterally. Kidneys demonstrate a normal enhancement pattern. Right renal cyst is noted inferiorly. This is stable from the prior exam. No obstructive changes are noted. Stomach/Bowel: The appendix is not visualized consistent with prior  surgical history. No obstructive or inflammatory changes are seen. Vascular/Lymphatic: Aortic atherosclerosis. No enlarged abdominal or pelvic lymph nodes. Reproductive: 15 mm hyperdense focus is noted in the uterus most consistent with a small fibroid. No other uterine or ovarian  abnormality is seen. Other: No abdominal wall hernia or abnormality. No abdominopelvic ascites. Musculoskeletal: Degenerative changes noted without acute abnormality. IMPRESSION: Uterine fibroid. No acute abnormality noted. Electronically Signed   By: Inez Catalina M.D.   On: 01/07/2018 09:58   Dg Chest Port 1 View  Result Date: 01/08/2018 CLINICAL DATA:  Respiratory failure EXAM: PORTABLE CHEST 1 VIEW COMPARISON:  01/07/2018 FINDINGS: Cardiac shadow is within normal limits. Aortic calcifications are seen. Lungs are hyperinflated consistent with COPD. Mild scarring is again seen in the right mid lung stable from previous exams. The mild vascular congestion seen on the prior exam has resolved. No sizable effusion or infiltrate is noted. IMPRESSION: Resolution of previously seen interstitial edema. COPD without acute abnormality. Electronically Signed   By: Inez Catalina M.D.   On: 01/08/2018 07:57       ASSESSMENT / PLAN:  Acute on chronic hypoxic/hypercapnic respiratory failure from COPD exacerbation 2nd to cocaine use. - oxygen to keep SpO2 90 to 95% - continue solumedrol - duoneb - day 2 of doxycycline  Depression. - celexa  Hypothyroidism. - synthroid  DVT prophylaxis - SQ heparin SUP - protonix Nutrition - regular diet Goals of care - full code  Transfer to floor bed 3/22 >> [facility] 3/23 and PCCM off  Chesley Mires, MD [employer] 01/08/2018, 2:05 PM Pager:  [phone] After 3pm call: [phone]

## 2018-01-08 NOTE — Care Management Note (Addendum)
Case Management Note  Patient Details  Name: Jasmine [name] MRN: [id number] Date of Birth: 1946-06-09  Subjective/Objective:    Severe COPD, CHF              Action/Plan: NCM spoke to pt and husband, [name] at bedside commode. Pt states she has oxygen with [employer], RW and neb machine. States she believes she had a [employer] NCM that came to the house. Provided pt with HH list/offered choice. Pt agreeable to [facility] for HH. Will need orders for HHRN/F2F. Husband and dtr in law assist pt as needed. Contacted [facility] with new referral.   On Bipap at night in hospital, paperwork for Bipap at night for home on shadow chart. Attending please complete if Bipap paperwork needed for home use.     Will continue to follow for dc needs.   Jonnie Finner RN CCM Case Mgmt phone [phone]  Expected Discharge Date:                  Expected Discharge Plan:  Home/Self Care  In-House Referral:  NA  Discharge planning Services  CM Consult  Post Acute Care Choice:  NA Choice offered to:  NA  DME Arranged:  N/A DME Agency:  NA  HH Arranged:  NA HH Agency:  NA  Status of Service:  In process, will continue to follow  If discussed at Long Length of Stay Meetings, dates discussed:    Additional Comments:  Erenest Rasher, RN 01/08/2018, 10:50 AM

## 2018-01-09 ENCOUNTER — Telehealth: Payer: Self-pay | Admitting: Adult Health

## 2018-01-09 DIAGNOSIS — J441 Chronic obstructive pulmonary disease with (acute) exacerbation: Principal | ICD-10-CM

## 2018-01-09 MED ORDER — PREDNISONE 20 MG PO TABS: 40.0000 mg | ORAL_TABLET | Freq: Every day | ORAL | Status: DC

## 2018-01-09 MED ORDER — DOXYCYCLINE HYCLATE 100 MG PO TABS: 100.0000 mg | ORAL_TABLET | Freq: Two times a day (BID) | ORAL | 0 refills | Status: DC

## 2018-01-09 MED ORDER — PREDNISONE 20 MG PO TABS: ORAL_TABLET | ORAL | 0 refills | Status: DC

## 2018-01-09 NOTE — Progress Notes (Signed)
Nurse went over discharge with patient. Patient verbalized understanding of discharge. All questions and concerns addressed. Discharging homes. Taking down in a will chair.

## 2018-01-09 NOTE — Discharge Summary (Signed)
Physician Discharge Summary  Patient ID: Jasmine [name] MRN: [id number] DOB/AGE: [birth year] 73 y.o.  Admit date: 01/07/2018 Discharge date: 01/09/2018    Discharge Diagnoses:  Active Problems:   Acute on chronic respiratory failure (HCC)                                                       D/c plan by Discharge Diagnosis  Acute on chronic hypoxic/hypercapnic respiratory failure from COPD exacerbation 2nd to cocaine use. - home O2 @4L  [place] as previous  - PO prednisone with taper  - resume home BDs  - continue doxycycline for total 5 days  - outpt pulm f/u  - ongoing substance abuse counseling   Depression. - celexa  Hypothyroidism. - synthroid  HTN CHF  - resume home lasix  - ASA - outpt PCP f/u    Brief Summary: 72yo female smoker (54 pack years - quit cigarettes, still smoked crack) with hx polysubstance abuse (cocaine, benzos), COPD, CHF on 4L home O2 presented 3/21 with altered mental status after being found in the bathroom.  Pupils were pinpoint.  In ER found to be significantly hypercarbic with PCO2 in the 90s.  She was placed on BiPAP with some improvement in mental status.  She was admitted to ICU with acute on chronic respiratory failure r/t AECOPD and mild volume overload secondary to cocaine abuse.  She improved quickly with IV steroids, BD's and was weaned off bipap.  On 3/23 she is awake, alert, appropriate, eating, ambulating in halls without difficulty.  Her respiratory status and O2 requirements are at her baseline and she is ready for d/c home.    Consults:  Lines/tubes:   Microbiology/Sepsis markers:   Significant Diagnostic Studies:      Vitals:   01/09/18 0000 01/09/18 0400 01/09/18 0817 01/09/18 0923  BP: 115/72 139/75 116/69   Pulse: 76 75 78   Resp: 18 18 18    Temp: 98.1 F (36.7 C) 98.2 F (36.8 C) 98.4 F (36.9 C)   TempSrc: Oral Oral Oral   SpO2: 100% 92% 96% 95%  Weight:      Height:         Discharge Labs  BMET Recent  Labs  Lab 01/07/18 0450 01/07/18 1245 01/08/18 0507  NA 141  --  142  K 5.5*  --  4.4  CL 91*  --  90*  CO2 35*  --  38*  GLUCOSE 150*  --  137*  BUN 11  --  17  CREATININE 0.78 0.73 0.79  CALCIUM 9.4  --  9.5     CBC  Recent Labs  Lab 01/07/18 0450 01/07/18 1245 01/08/18 0507  HGB 12.8 12.4 12.2  HCT 44.3 42.8 41.2  WBC 11.0* 6.8 5.0  PLT 146* 162 154   Anti-Coagulation No results for input(s): INR in the last 168 hours.          Allergies as of 01/09/2018      Reactions   Alendronate Sodium Other (See Comments)   Pt does not remember this reaction      Medication List    TAKE these medications   albuterol (2.5 MG/3ML) 0.083% nebulizer solution Commonly known as:  PROVENTIL Take 3 mLs (2.5 mg total) by nebulization every 4 (four) hours as needed for wheezing.   albuterol 108 (90  Base) MCG/ACT inhaler Commonly known as:  PROAIR HFA Inhale 1-2 puffs into the lungs every 6 (six) hours as needed for wheezing or shortness of breath.   ALPRAZolam 0.25 MG tablet Commonly known as:  XANAX Take 1 tablet (0.25 mg total) by mouth 2 (two) times daily.   aspirin 81 MG EC tablet Take 1 tablet (81 mg total) by mouth daily.   citalopram 20 MG tablet Commonly known as:  CELEXA TAKE 1 TABLET BY MOUTH DAILY What changed:    how much to take  how to take this  when to take this   doxycycline 100 MG tablet Commonly known as:  VIBRA-TABS Take 1 tablet (100 mg total) by mouth every 12 (twelve) hours.   feeding supplement (ENSURE ENLIVE) Liqd Take 237 mLs by mouth 3 (three) times daily between meals.   fluticasone furoate-vilanterol 200-25 MCG/INH Aepb Commonly known as:  BREO ELLIPTA Inhale 1 puff into the lungs daily.   furosemide 40 MG tablet Commonly known as:  LASIX Take 1 tablet (40 mg total) by mouth 2 (two) times daily.   levothyroxine 75 MCG tablet Commonly known as:  SYNTHROID, LEVOTHROID TAKE 1 TABLET BY MOUTH DAILY BEFORE BREAKFAST. What  changed:  See the new instructions.   OXYGEN Inhale 4 L into the lungs continuous.   potassium chloride 10 MEQ tablet Commonly known as:  K-DUR Take 1 tablet (10 mEq total) by mouth 2 (two) times daily.   predniSONE 20 MG tablet Commonly known as:  DELTASONE Take 4 tabs PO daily x 3 days, then 3 tabs PO daily x 3 days, then 2 tabs PO daily x 3 days, then 1 tab PO daily x 3 days, then STOP   tiotropium 18 MCG inhalation capsule Commonly known as:  SPIRIVA HANDIHALER Place 1 capsule (18 mcg total) into inhaler and inhale daily.         Disposition: home   Discharged Condition: Jasmine [name] has met maximum benefit of inpatient care and is medically stable and cleared for discharge.  Patient is pending follow up as above.      Time spent on disposition:  Greater than 35 minutes.   SignedNickolas Madrid, NP 01/09/2018  11:12 AM Pager: [phone] or [phone]

## 2018-01-09 NOTE — Progress Notes (Signed)
Spoke w midlevel, no needs for [facility] at this time. Patient ready for DC to home.

## 2018-01-09 NOTE — Telephone Encounter (Signed)
Hospital f/u in 1-2 weeks with RA, TP, SG

## 2018-01-11 ENCOUNTER — Telehealth: Payer: Self-pay | Admitting: *Deleted

## 2018-01-11 ENCOUNTER — Telehealth: Payer: Self-pay | Admitting: Obstetrics and Gynecology

## 2018-01-11 NOTE — Telephone Encounter (Signed)
Pt was on TCM report admitted 01/07/18 for Acute on chronic respiratory failure. Pt D/C 01/09/18, and will f/u w/pulmonology in 2 weeks.Marland KitchenJohny Chess

## 2018-01-11 NOTE — Telephone Encounter (Signed)
Patient canceled her hsv consult appointment 01/12/18 and will call later to reschedule. Patient stated that she fell in the shower over the weekend and will reschedule once she has recovered.

## 2018-01-11 NOTE — Telephone Encounter (Signed)
lmtcb x1 for pt. 

## 2018-01-12 ENCOUNTER — Ambulatory Visit: Payer: Self-pay | Admitting: Obstetrics and Gynecology

## 2018-01-12 NOTE — Telephone Encounter (Signed)
Okay to close encounter?  °

## 2018-01-13 NOTE — Consult Note (Signed)
            [facility] CM Primary Care Navigator  01/13/2018  Jasmine [name] 1946/05/30 [id number]  Attempt to seepatient at the bedside to identify possible discharge needsbutshe was already dischargehomeover the weekend.  Patient was admitted and treated for acute on chronic respiratory failure from COPD exacerbation secondary to cocaine use.  Primary care provider's officeis listed as providingtransition of care (TOC)follow-up.  Noted that patient has instruction to follow-up with pulmonology in 2 weeks.   For additional questions please contact:  Edwena Felty A. Abednego Yeates, BSN, RN-BC [facility] PRIMARY CARE Navigator Cell: [phone]

## 2018-01-14 ENCOUNTER — Other Ambulatory Visit: Payer: Medicare HMO

## 2018-01-15 ENCOUNTER — Telehealth: Payer: Self-pay | Admitting: Obstetrics and Gynecology

## 2018-01-15 NOTE — Telephone Encounter (Signed)
Patient received bill for missed appointment charge from 01/05/18. Appointment was cancelled morning of, however she had been in the hospital after hitting her head and would like to know if the fee still applies.

## 2018-01-23 DIAGNOSIS — J449 Chronic obstructive pulmonary disease, unspecified: Secondary | ICD-10-CM | POA: Diagnosis not present

## 2018-01-26 ENCOUNTER — Encounter: Payer: Self-pay | Admitting: Adult Health

## 2018-01-26 ENCOUNTER — Ambulatory Visit (INDEPENDENT_AMBULATORY_CARE_PROVIDER_SITE_OTHER): Payer: Medicare HMO | Admitting: Adult Health

## 2018-01-26 DIAGNOSIS — J432 Centrilobular emphysema: Secondary | ICD-10-CM

## 2018-01-26 DIAGNOSIS — F1910 Other psychoactive substance abuse, uncomplicated: Secondary | ICD-10-CM

## 2018-01-26 DIAGNOSIS — I5032 Chronic diastolic (congestive) heart failure: Secondary | ICD-10-CM

## 2018-01-26 DIAGNOSIS — J9611 Chronic respiratory failure with hypoxia: Secondary | ICD-10-CM | POA: Diagnosis not present

## 2018-01-26 NOTE — Assessment & Plan Note (Signed)
Cocaine cessation discussed.  Declines referral .

## 2018-01-26 NOTE — Assessment & Plan Note (Signed)
Cont on O2 .  

## 2018-01-26 NOTE — Assessment & Plan Note (Signed)
Recent flare now improved   Plan:  Patient Instructions  Continue on Spiriva 1 puff daily  Continue on BREO daily  Rinse after use.  Continue on Oxygen 3l-4 l/m   , please do not turn off your machine .  Please stay away from cocaine.  Follow up Dr. Elsworth Soho  In 2-3- months and  As needed    Please contact office for sooner follow up if symptoms do not improve or worsen or seek emergency care

## 2018-01-26 NOTE — Progress Notes (Signed)
@Patient  ID: Jasmine [name], female    DOB: 05-16-1946, 72 y.o.   MRN: [id number]  Chief Complaint  Patient presents with  . Follow-up    COPD    Referring provider: Biagio Borg, MD  HPI: [phone].o former smoker for FU of COPD , O2 RF , Lung nodules  .Seen for initial pulmonary eval 02/03/13  She was started on O2 in march 2014 &uses 33 liters 02 continuously and w/ sleep, and 4L pulsed with activity.  She has smoked Since her 65's - 87 Pyrs. Quit 07/21/2015. Ongoing cocaine use /polysubstance abuse  Husband passed away 2016-01-28 while she was hospitalized.  TEST CT chest 10/2011 >>showed Moderate centrilobular emphysema. Small bilateral pleural effusions with dependent atelectasis  CT chest 07/2016-resolving right upper lobe consolidation,spiculated subcentimeter nodules that have been noted before with low-grade PET hypermetabolism  PFT >FEV1 at 0.47 L( 30%), ratio 39 , No BD response (<200cc)  2014 completed Pulm Rehab  01/26/2018 Follow up : COPD , O2 RF and Lung nodules , polysubstance abuse , Post hospital follow up  Patient presents for a follow-up.  She was recently admitted to the hospital last month for a COPD exacerbation with acute on chronic hypercarbic respiratory failure.  This was felt to be secondary to cocaine use.  Patient was found with altered mental status at home in her bathroom.  On arrival to the emergency room she was significantly hypercarbic with PCO2's in the 90s.  She was placed on BiPAP support.  Tox screen showed positive for cocaine and benzos.  She was treated for COPD exacerbation with IV steroids and nebulized bronchodilators.  She did have some decompensated diastolic heart failure.  That responded to diuresis. Since discharge patient is feeling better. Cough and congestion are decreased.  No leg swelling or orthopnea .  Says she has not used any cocaine since discharge. Cessation discussed.     Allergies  Allergen Reactions  .  Alendronate Sodium Other (See Comments)    Pt does not remember this reaction    Immunization History  Administered Date(s) Administered  . Influenza Split 07/10/2011, 08/18/2012  . Influenza Whole 07/21/2008, 08/23/2009, 10/10/2010  . Influenza, High Dose Seasonal PF 07/10/2016, 09/03/2017  . Influenza,inj,Quad PF,6+ Mos 08/12/2013, 07/10/2014  . Pneumococcal Conjugate-13 11/30/2014  . Pneumococcal Polysaccharide-23 07/21/2007, 01/12/2013  . Td 08/23/2009  . Zoster 07/20/2006    Past Medical History:  Diagnosis Date  . ALLERGIC RHINITIS 05/01/2007  . Anxiety   . Arthritis    "hands" (12/28/2015)  . ASTHMA 05/01/2007   "since I was a child"  . Cancer (HCC)    LUNG  . CHF (congestive heart failure) ([place])   . Chronic bronchitis ([place])   . COPD (chronic obstructive pulmonary disease) ([place])   . DEPRESSION 07/21/2008  . DVT (deep venous thrombosis) (HCC) 1960s   LLE  . FATIGUE 10/10/2010  . HYPERTENSION 05/01/2007  . HYPERTHYROIDISM 11/23/2007   Pt endorses having had Graves disease, possibly radioactive iodine x1, but no thyroidectomy  . HYPOTHYROIDISM 08/23/2009  . LUMBAR RADICULOPATHY, RIGHT 08/25/2008  . On home oxygen therapy    "3-4L; qd; all the time" (12/28/2015)  . OSTEOPOROSIS 05/01/2007  . SHOULDER PAIN, LEFT 08/23/2009  . SINUSITIS, CHRONIC 10/10/2010    Tobacco History: Social History   Tobacco Use  Smoking Status Former Smoker  . Packs/day: 1.00  . Years: 54.00  . Pack years: 54.00  . Types: Cigarettes  . Last attempt to quit: 07/21/2015  . Years since  quitting: 2.5  Smokeless Tobacco Never Used   Counseling given: Not Answered   Outpatient Encounter Medications as of 01/26/2018  Medication Sig  . albuterol (PROAIR HFA) 108 (90 Base) MCG/ACT inhaler Inhale 1-2 puffs into the lungs every 6 (six) hours as needed for wheezing or shortness of breath.  [name] albuterol (PROVENTIL) (2.5 MG/3ML) 0.083% nebulizer solution Take 3 mLs (2.5 mg total) by nebulization every 4  (four) hours as needed for wheezing.  [name] ALPRAZolam (XANAX) 0.25 MG tablet Take 1 tablet (0.25 mg total) by mouth 2 (two) times daily.  [name] aspirin EC 81 MG EC tablet Take 1 tablet (81 mg total) by mouth daily.  . citalopram (CELEXA) 20 MG tablet TAKE 1 TABLET BY MOUTH DAILY (Patient taking differently: TAKE 1 TABLET (20 MG)  BY MOUTH DAILY)  . fluticasone furoate-vilanterol (BREO ELLIPTA) 200-25 MCG/INH AEPB Inhale 1 puff into the lungs daily.  . furosemide (LASIX) 40 MG tablet Take 1 tablet (40 mg total) by mouth 2 (two) times daily.  [name] levothyroxine (SYNTHROID, LEVOTHROID) 75 MCG tablet TAKE 1 TABLET BY MOUTH DAILY BEFORE BREAKFAST. (Patient taking differently: TAKE 1 TABLET (75 MCG) BY MOUTH DAILY BEFORE BREAKFAST.)  . OXYGEN Inhale 4 L into the lungs continuous.   . potassium chloride (K-DUR) 10 MEQ tablet Take 1 tablet (10 mEq total) by mouth 2 (two) times daily.  [name] tiotropium (SPIRIVA HANDIHALER) 18 MCG inhalation capsule Place 1 capsule (18 mcg total) into inhaler and inhale daily.  . [DISCONTINUED] doxycycline (VIBRA-TABS) 100 MG tablet Take 1 tablet (100 mg total) by mouth every 12 (twelve) hours.  . [DISCONTINUED] predniSONE (DELTASONE) 20 MG tablet Take 4 tabs PO daily x 3 days, then 3 tabs PO daily x 3 days, then 2 tabs PO daily x 3 days, then 1 tab PO daily x 3 days, then STOP  . feeding supplement, ENSURE ENLIVE, (ENSURE ENLIVE) LIQD Take 237 mLs by mouth 3 (three) times daily between meals. (Patient not taking: Reported on 01/26/2018)   No facility-administered encounter medications on file as of 01/26/2018.      Review of Systems  Constitutional:   No  weight loss, night sweats,  Fevers, chills, fatigue, or  lassitude.  HEENT:   No headaches,  Difficulty swallowing,  Tooth/dental problems, or  Sore throat,                No sneezing, itching, ear ache, nasal congestion, post nasal drip,   CV:  No chest pain,  Orthopnea, PND, swelling in lower extremities, anasarca, dizziness,  palpitations, syncope.   GI  No heartburn, indigestion, abdominal pain, nausea, vomiting, diarrhea, change in bowel habits, loss of appetite, bloody stools.   Resp:  .  No chest wall deformity  Skin: no rash or lesions.  GU: no dysuria, change in color of urine, no urgency or frequency.  No flank pain, no hematuria   MS:  No joint pain or swelling.  No decreased range of motion.  No back pain.    Physical Exam  BP (!) 108/56 (BP Location: Left Arm, Cuff Size: Normal)   Pulse (!) 107   Ht 5' (1.524 m)   Wt 112 lb 6.4 oz (51 kg)   SpO2 91%   BMI 21.95 kg/m   GEN: A/Ox3; pleasant , NAD thin and frail on O2 .    HEENT:  [place]/AT,  EACs-clear, TMs-wnl, NOSE-clear, THROAT-clear, no lesions, no postnasal drip or exudate noted.   NECK:  Supple w/ fair ROM; no JVD;  normal carotid impulses w/o bruits; no thyromegaly or nodules palpated; no lymphadenopathy.    RESP  Decreased BS in bases  no accessory muscle use, no dullness to percussion  CARD:  RRR, no m/r/g, no peripheral edema, pulses intact, no cyanosis or clubbing.  GI:   Soft & nt; nml bowel sounds; no organomegaly or masses detected.   Musco: Warm bil, no deformities or joint swelling noted.   Neuro: alert, no focal deficits noted.    Skin: Warm, no lesions or rashes    Lab Results:  CBC  BMET   Imaging: Dg Chest 1 View  Result Date: 01/07/2018 CLINICAL DATA:  Initial evaluation for acute trauma, fall, found unresponsive. EXAM: CHEST  1 VIEW COMPARISON:  Prior radiograph from 06/03/2017. FINDINGS: Cardiac and mediastinal silhouettes are stable in size and contour, and remain within normal limits. Aortic atherosclerosis. Lungs are hyperinflated with underlying emphysematous changes. Linear scarring along the right minor fissure is stable from previous. Mild diffuse vascular congestion with interstitial prominence suggestive of mild interstitial congestion. No frank pulmonary edema. No consolidative airspace opacity. No  pneumothorax. No pleural effusion. No acute osseous abnormality. IMPRESSION: 1. Hyperinflation with COPD. Diffusely increased prominence of the interstitial markings suggestive of superimposed mild pulmonary interstitial congestion/edema. 2. Aortic atherosclerosis. Electronically Signed   By: Jeannine Boga M.D.   On: 01/07/2018 06:02   Ct Head Wo Contrast  Result Date: 01/07/2018 CLINICAL DATA:  Head trauma, ataxia. Found unresponsive in bathroom. EXAM: CT HEAD WITHOUT CONTRAST CT CERVICAL SPINE WITHOUT CONTRAST TECHNIQUE: Multidetector CT imaging of the head and cervical spine was performed following the standard protocol without intravenous contrast. Multiplanar CT image reconstructions of the cervical spine were also generated. COMPARISON:  None. FINDINGS: CT HEAD FINDINGS Brain: Brain volume is normal for age. No intracranial hemorrhage, mass effect, or midline shift. No hydrocephalus. The basilar cisterns are patent. No evidence of territorial infarct or acute ischemia. No extra-axial or intracranial fluid collection. Vascular: Atherosclerosis of skullbase vasculature without hyperdense vessel or abnormal calcification. Skull: No fracture or focal lesion. Sinuses/Orbits: Paranasal sinuses and mastoid air cells are clear. The visualized orbits are unremarkable. Other: None. CT CERVICAL SPINE FINDINGS Alignment: Straightening of normal lordosis. No traumatic subluxation. Skull base and vertebrae: No acute fracture. Vertebral body heights are maintained. The dens and skull base are intact. Soft tissues and spinal canal: No prevertebral fluid or swelling. No visible canal hematoma. Disc levels: Disc space narrowing and endplate spurring Z0-C5 and C5-C6. Endplate spurring at E5-I7 and C6-C7 with relative preservation of disc spaces. Upper chest: Emphysema.  No acute finding. Other: None. IMPRESSION: 1. Unremarkable noncontrast head CT for age. 2. Mild degenerative change in the cervical spine without  acute fracture or subluxation. Electronically Signed   By: Jeb Levering M.D.   On: 01/07/2018 05:57   Ct Cervical Spine Wo Contrast  Result Date: 01/07/2018 CLINICAL DATA:  Head trauma, ataxia. Found unresponsive in bathroom. EXAM: CT HEAD WITHOUT CONTRAST CT CERVICAL SPINE WITHOUT CONTRAST TECHNIQUE: Multidetector CT imaging of the head and cervical spine was performed following the standard protocol without intravenous contrast. Multiplanar CT image reconstructions of the cervical spine were also generated. COMPARISON:  None. FINDINGS: CT HEAD FINDINGS Brain: Brain volume is normal for age. No intracranial hemorrhage, mass effect, or midline shift. No hydrocephalus. The basilar cisterns are patent. No evidence of territorial infarct or acute ischemia. No extra-axial or intracranial fluid collection. Vascular: Atherosclerosis of skullbase vasculature without hyperdense vessel or abnormal calcification. Skull: No fracture or  focal lesion. Sinuses/Orbits: Paranasal sinuses and mastoid air cells are clear. The visualized orbits are unremarkable. Other: None. CT CERVICAL SPINE FINDINGS Alignment: Straightening of normal lordosis. No traumatic subluxation. Skull base and vertebrae: No acute fracture. Vertebral body heights are maintained. The dens and skull base are intact. Soft tissues and spinal canal: No prevertebral fluid or swelling. No visible canal hematoma. Disc levels: Disc space narrowing and endplate spurring A8-T4 and C5-C6. Endplate spurring at H9-Q2 and C6-C7 with relative preservation of disc spaces. Upper chest: Emphysema.  No acute finding. Other: None. IMPRESSION: 1. Unremarkable noncontrast head CT for age. 2. Mild degenerative change in the cervical spine without acute fracture or subluxation. Electronically Signed   By: Jeb Levering M.D.   On: 01/07/2018 05:57   Ct Abdomen Pelvis W Contrast  Result Date: 01/07/2018 CLINICAL DATA:  Diffuse abdominal pain EXAM: CT ABDOMEN AND PELVIS  WITH CONTRAST TECHNIQUE: Multidetector CT imaging of the abdomen and pelvis was performed using the standard protocol following bolus administration of intravenous contrast. CONTRAST:  74mL ISOVUE-300 IOPAMIDOL (ISOVUE-300) INJECTION 61% COMPARISON:  05/07/2016 FINDINGS: Lower chest: Some scarring is noted in the lower lobes bilaterally. No focal nodule is seen. Hepatobiliary: No focal liver abnormality is seen. No gallstones, gallbladder wall thickening, or biliary dilatation. Pancreas: Unremarkable. No pancreatic ductal dilatation or surrounding inflammatory changes. Spleen: Normal in size without focal abnormality. Adrenals/Urinary Tract: Adrenal glands are within normal limits bilaterally. Kidneys demonstrate a normal enhancement pattern. Right renal cyst is noted inferiorly. This is stable from the prior exam. No obstructive changes are noted. Stomach/Bowel: The appendix is not visualized consistent with prior surgical history. No obstructive or inflammatory changes are seen. Vascular/Lymphatic: Aortic atherosclerosis. No enlarged abdominal or pelvic lymph nodes. Reproductive: 15 mm hyperdense focus is noted in the uterus most consistent with a small fibroid. No other uterine or ovarian abnormality is seen. Other: No abdominal wall hernia or abnormality. No abdominopelvic ascites. Musculoskeletal: Degenerative changes noted without acute abnormality. IMPRESSION: Uterine fibroid. No acute abnormality noted. Electronically Signed   By: Inez Catalina M.D.   On: 01/07/2018 09:58   Dg Chest Port 1 View  Result Date: 01/08/2018 CLINICAL DATA:  Respiratory failure EXAM: PORTABLE CHEST 1 VIEW COMPARISON:  01/07/2018 FINDINGS: Cardiac shadow is within normal limits. Aortic calcifications are seen. Lungs are hyperinflated consistent with COPD. Mild scarring is again seen in the right mid lung stable from previous exams. The mild vascular congestion seen on the prior exam has resolved. No sizable effusion or infiltrate  is noted. IMPRESSION: Resolution of previously seen interstitial edema. COPD without acute abnormality. Electronically Signed   By: Inez Catalina M.D.   On: 01/08/2018 07:57     Assessment & Plan:   COPD (chronic obstructive pulmonary disease) (HCC) Recent exacerbation now resolving   Plan  Patient Instructions  Continue on Spiriva 1 puff daily  Continue on BREO daily  Rinse after use.  Continue on Oxygen 3l-4 l/m   , please do not turn off your machine .  Please stay away from cocaine.  Follow up Dr. Elsworth Soho  In 2-3- months and  As needed    Please contact office for sooner follow up if symptoms do not improve or worsen or seek emergency care                Chronic respiratory failure with hypoxia ([place]) Cont on O2   Chronic diastolic congestive heart failure ([place]) Recent flare now improved   Plan:  Patient Instructions  Continue  on Spiriva 1 puff daily  Continue on BREO daily  Rinse after use.  Continue on Oxygen 3l-4 l/m   , please do not turn off your machine .  Please stay away from cocaine.  Follow up Dr. Elsworth Soho  In 2-3- months and  As needed    Please contact office for sooner follow up if symptoms do not improve or worsen or seek emergency care                Polysubstance abuse ([place]) Cocaine cessation discussed.  Declines referral .      Rexene Edison, NP 01/26/2018

## 2018-01-26 NOTE — Patient Instructions (Addendum)
Continue on Spiriva 1 puff daily  Continue on BREO daily  Rinse after use.  Continue on Oxygen 3l-4 l/m   , please do not turn off your machine .  Please stay away from cocaine.  Follow up Dr. Elsworth Soho  In 2-3- months and  As needed    Please contact office for sooner follow up if symptoms do not improve or worsen or seek emergency care

## 2018-01-26 NOTE — Assessment & Plan Note (Signed)
Recent exacerbation now resolving   Plan  Patient Instructions  Continue on Spiriva 1 puff daily  Continue on BREO daily  Rinse after use.  Continue on Oxygen 3l-4 l/m   , please do not turn off your machine .  Please stay away from cocaine.  Follow up Dr. Elsworth Soho  In 2-3- months and  As needed    Please contact office for sooner follow up if symptoms do not improve or worsen or seek emergency care

## 2018-02-03 DIAGNOSIS — J9611 Chronic respiratory failure with hypoxia: Secondary | ICD-10-CM | POA: Diagnosis not present

## 2018-02-03 DIAGNOSIS — R300 Dysuria: Secondary | ICD-10-CM | POA: Diagnosis not present

## 2018-02-03 DIAGNOSIS — R911 Solitary pulmonary nodule: Secondary | ICD-10-CM | POA: Diagnosis not present

## 2018-02-03 DIAGNOSIS — J449 Chronic obstructive pulmonary disease, unspecified: Secondary | ICD-10-CM | POA: Diagnosis not present

## 2018-02-18 ENCOUNTER — Ambulatory Visit (INDEPENDENT_AMBULATORY_CARE_PROVIDER_SITE_OTHER): Payer: Medicare HMO | Admitting: Adult Health

## 2018-02-18 ENCOUNTER — Encounter: Payer: Self-pay | Admitting: Adult Health

## 2018-02-18 DIAGNOSIS — H6120 Impacted cerumen, unspecified ear: Secondary | ICD-10-CM | POA: Insufficient documentation

## 2018-02-18 DIAGNOSIS — J432 Centrilobular emphysema: Secondary | ICD-10-CM | POA: Diagnosis not present

## 2018-02-18 DIAGNOSIS — J9611 Chronic respiratory failure with hypoxia: Secondary | ICD-10-CM | POA: Diagnosis not present

## 2018-02-18 DIAGNOSIS — H6123 Impacted cerumen, bilateral: Secondary | ICD-10-CM | POA: Diagnosis not present

## 2018-02-18 NOTE — Assessment & Plan Note (Signed)
Cont on O2 .  

## 2018-02-18 NOTE — Patient Instructions (Signed)
Continue on Spiriva 1 puff daily  Continue on BREO daily  Rinse after use.  Continue on Oxygen 3l-4 l/m   May use Debrox ear drops As needed   Follow up Dr. Elsworth Soho  In 2-3- months and  As needed    Please contact office for sooner follow up if symptoms do not improve or worsen or seek emergency care

## 2018-02-18 NOTE — Assessment & Plan Note (Signed)
Compensated on present regimen  Plan  Patient Instructions  Continue on Spiriva 1 puff daily  Continue on BREO daily  Rinse after use.  Continue on Oxygen 3l-4 l/m   May use Debrox ear drops As needed   Follow up Dr. Elsworth Soho  In 2-3- months and  As needed    Please contact office for sooner follow up if symptoms do not improve or worsen or seek emergency care

## 2018-02-18 NOTE — Assessment & Plan Note (Signed)
Bilateral cerumen impaction ear irrigation with removal of wax.  Patient tolerated procedure well without any known complications.  Patient is advised to use Debrox over-the-counter eardrops as needed

## 2018-02-18 NOTE — Progress Notes (Signed)
@Patient  ID: Jasmine [name], female    DOB: Mar 28, 1946, 72 y.o.   MRN: [id number]  No chief complaint on file.   Referring provider: Biagio Borg, MD  HPI: [id number].oformersmoker for FU of COPD , O2 RF , Lung nodules  .Seen for initial pulmonary eval 02/03/13  She was started on O2 in march 2014 &uses 33 liters 02 continuously and w/ sleep, and 4L pulsed with activity.  She has smoked Since her 31's - 92 Pyrs. Quit 07/21/2015. Ongoingcocaine use/polysubstance abuse Husband passed away 01-14-2016 while she was hospitalized.  TEST CT chest 10/2011 >>showed Moderate centrilobular emphysema. Small bilateral pleural effusions with dependent atelectasis  CT chest 07/2016-resolving right upper lobe consolidation,spiculated subcentimeter nodules that have been noted before with low-grade PET hypermetabolism  PFT >FEV1 at 0.47 L( 30%), ratio 39 , No BD response (<200cc)  2014 completed Pulm Rehab  02/18/2018 Follow up: COPD , O2 RF , Lung nodules , Polysubstance abuse, stopped up ears.  Patient presents for a follow-up visit.  Patient complains over the last few weeks that her ears have been very stopped up.  She is having hard time hearing.  She denies any pain.  She feels like they are stopped up with wax.  Patient has underlying severe COPD that is oxygen dependent.  She was recently admitted in March with hypercarbic respiratory failure and a COPD exacerbation felt secondary to cocaine use.  She says she has been doing better.  Breathing is back to baseline.  She denies any increased cough or congestion.  Patient says she has not used cocaine since discharge.  She remains on oxygen 4 L.   Allergies  Allergen Reactions  . Alendronate Sodium Other (See Comments)    Pt does not remember this reaction    Immunization History  Administered Date(s) Administered  . Influenza Split 07/10/2011, 08/18/2012  . Influenza Whole 07/21/2008, 08/23/2009, 10/10/2010  . Influenza, High  Dose Seasonal PF 07/10/2016, 09/03/2017  . Influenza,inj,Quad PF,6+ Mos 08/12/2013, 07/10/2014  . Pneumococcal Conjugate-13 11/30/2014  . Pneumococcal Polysaccharide-23 07/21/2007, 01/12/2013  . Td 08/23/2009  . Zoster 07/20/2006    Past Medical History:  Diagnosis Date  . ALLERGIC RHINITIS 05/01/2007  . Anxiety   . Arthritis    "hands" (12/28/2015)  . ASTHMA 05/01/2007   "since I was a child"  . Cancer (HCC)    LUNG  . CHF (congestive heart failure) ([facility])   . Chronic bronchitis ([place])   . COPD (chronic obstructive pulmonary disease) ([place])   . DEPRESSION 07/21/2008  . DVT (deep venous thrombosis) (HCC) 1960s   LLE  . FATIGUE 10/10/2010  . HYPERTENSION 05/01/2007  . HYPERTHYROIDISM 11/23/2007   Pt endorses having had Graves disease, possibly radioactive iodine x1, but no thyroidectomy  . HYPOTHYROIDISM 08/23/2009  . LUMBAR RADICULOPATHY, RIGHT 08/25/2008  . On home oxygen therapy    "3-4L; qd; all the time" (12/28/2015)  . OSTEOPOROSIS 05/01/2007  . SHOULDER PAIN, LEFT 08/23/2009  . SINUSITIS, CHRONIC 10/10/2010    Tobacco History: Social History   Tobacco Use  Smoking Status Former Smoker  . Packs/day: 1.00  . Years: 54.00  . Pack years: 54.00  . Types: Cigarettes  . Last attempt to quit: 07/21/2015  . Years since quitting: 2.5  Smokeless Tobacco Never Used   Counseling given: Not Answered   Outpatient Encounter Medications as of 02/18/2018  Medication Sig  . albuterol (PROAIR HFA) 108 (90 Base) MCG/ACT inhaler Inhale 1-2 puffs into the lungs every 6 (six) hours  as needed for wheezing or shortness of breath.  [name] albuterol (PROVENTIL) (2.5 MG/3ML) 0.083% nebulizer solution Take 3 mLs (2.5 mg total) by nebulization every 4 (four) hours as needed for wheezing.  [name] ALPRAZolam (XANAX) 0.25 MG tablet Take 1 tablet (0.25 mg total) by mouth 2 (two) times daily.  [name] aspirin EC 81 MG EC tablet Take 1 tablet (81 mg total) by mouth daily.  . citalopram (CELEXA) 20 MG tablet TAKE 1 TABLET  BY MOUTH DAILY (Patient taking differently: TAKE 1 TABLET (20 MG)  BY MOUTH DAILY)  . feeding supplement, ENSURE ENLIVE, (ENSURE ENLIVE) LIQD Take 237 mLs by mouth 3 (three) times daily between meals.  . fluticasone furoate-vilanterol (BREO ELLIPTA) 200-25 MCG/INH AEPB Inhale 1 puff into the lungs daily.  . furosemide (LASIX) 40 MG tablet Take 1 tablet (40 mg total) by mouth 2 (two) times daily.  [name] levothyroxine (SYNTHROID, LEVOTHROID) 75 MCG tablet TAKE 1 TABLET BY MOUTH DAILY BEFORE BREAKFAST. (Patient taking differently: TAKE 1 TABLET (75 MCG) BY MOUTH DAILY BEFORE BREAKFAST.)  . OXYGEN Inhale 4 L into the lungs continuous.   . potassium chloride (K-DUR) 10 MEQ tablet Take 1 tablet (10 mEq total) by mouth 2 (two) times daily.  [name] tiotropium (SPIRIVA HANDIHALER) 18 MCG inhalation capsule Place 1 capsule (18 mcg total) into inhaler and inhale daily.   No facility-administered encounter medications on file as of 02/18/2018.      Review of Systems  Constitutional:   No  weight loss, night sweats,  Fevers, chills, fatigue, or  lassitude.  HEENT:   No headaches,  Difficulty swallowing,  Tooth/dental problems, or  Sore throat,                No sneezing, itching, ear ache,  +nasal congestion, post nasal drip,   CV:  No chest pain,  Orthopnea, PND, swelling in lower extremities, anasarca, dizziness, palpitations, syncope.   GI  No heartburn, indigestion, abdominal pain, nausea, vomiting, diarrhea, change in bowel habits, loss of appetite, bloody stools.   Resp:   No wheezing.  No chest wall deformity  Skin: no rash or lesions.  GU: no dysuria, change in color of urine, no urgency or frequency.  No flank pain, no hematuria   MS:  No joint pain or swelling.  No decreased range of motion.  No back pain.    Physical Exam  BP 102/60   Pulse 90   Ht 5' (1.524 m)   Wt 109 lb (49.4 kg)   SpO2 90%   BMI 21.29 kg/m   GEN: A/Ox3; pleasant , NAD, thin chronically ill-appearing on oxygen     HEENT:  [place]/AT,  EACs-bilateral cerumen impaction, NOSE-clear, THROAT-clear, no lesions, no postnasal drip or exudate noted.   NECK:  Supple w/ fair ROM; no JVD; normal carotid impulses w/o bruits; no thyromegaly or nodules palpated; no lymphadenopathy.    RESP diminished breath sounds in the bases . no accessory muscle use, no dullness to percussion  CARD:  RRR, no m/r/g, no peripheral edema, pulses intact, no cyanosis or clubbing.  GI:   Soft & nt; nml bowel sounds; no organomegaly or masses detected.   Musco: Warm bil, no deformities or joint swelling noted.   Neuro: alert, no focal deficits noted.    Skin: Warm, no lesions or rashes    Lab Results:   BMET   Imaging: No results found.   Assessment & Plan:   COPD (chronic obstructive pulmonary disease) ([place]) Compensated on present regimen  Plan  Patient Instructions  Continue on Spiriva 1 puff daily  Continue on BREO daily  Rinse after use.  Continue on Oxygen 3l-4 l/m   May use Debrox ear drops As needed   Follow up Dr. Elsworth Soho  In 2-3- months and  As needed    Please contact office for sooner follow up if symptoms do not improve or worsen or seek emergency care                Chronic respiratory failure with hypoxia ([place]) Cont on O2   Cerumen impaction Bilateral cerumen impaction ear irrigation with removal of wax.  Patient tolerated procedure well without any known complications.  Patient is advised to use Debrox over-the-counter eardrops as needed     Rexene Edison, NP 02/18/2018

## 2018-02-22 DIAGNOSIS — J449 Chronic obstructive pulmonary disease, unspecified: Secondary | ICD-10-CM | POA: Diagnosis not present

## 2018-02-23 ENCOUNTER — Other Ambulatory Visit: Payer: Self-pay | Admitting: Internal Medicine

## 2018-03-03 NOTE — Progress Notes (Signed)
Reviewed & agree with plan  

## 2018-03-05 DIAGNOSIS — J9611 Chronic respiratory failure with hypoxia: Secondary | ICD-10-CM | POA: Diagnosis not present

## 2018-03-05 DIAGNOSIS — R911 Solitary pulmonary nodule: Secondary | ICD-10-CM | POA: Diagnosis not present

## 2018-03-05 DIAGNOSIS — R300 Dysuria: Secondary | ICD-10-CM | POA: Diagnosis not present

## 2018-03-05 DIAGNOSIS — J449 Chronic obstructive pulmonary disease, unspecified: Secondary | ICD-10-CM | POA: Diagnosis not present

## 2018-03-09 ENCOUNTER — Telehealth: Payer: Self-pay

## 2018-03-09 NOTE — Telephone Encounter (Signed)
I have not seen this form yet. Is it on the fax?  Copied from [place] [phone]. Topic: General - Other >> Mar 09, 2018 10:21 AM Aurelio Brash B wrote: Reason for CRM: Mahesh  from [facility] is asking that the pts letter of medical necessity be signed and sent back to them    [phone]

## 2018-03-19 ENCOUNTER — Other Ambulatory Visit: Payer: Self-pay | Admitting: Internal Medicine

## 2018-03-22 ENCOUNTER — Observation Stay (HOSPITAL_BASED_OUTPATIENT_CLINIC_OR_DEPARTMENT_OTHER)
Admission: EM | Admit: 2018-03-22 | Discharge: 2018-03-23 | Disposition: A | Discharge status: Home / Self Care | Payer: Medicare HMO | Source: Home / Self Care | Attending: Emergency Medicine | Admitting: Emergency Medicine

## 2018-03-22 ENCOUNTER — Emergency Department (HOSPITAL_COMMUNITY): Payer: Medicare HMO

## 2018-03-22 ENCOUNTER — Other Ambulatory Visit: Payer: Self-pay

## 2018-03-22 ENCOUNTER — Encounter (HOSPITAL_COMMUNITY): Payer: Self-pay

## 2018-03-22 DIAGNOSIS — Z8349 Family history of other endocrine, nutritional and metabolic diseases: Secondary | ICD-10-CM | POA: Insufficient documentation

## 2018-03-22 DIAGNOSIS — Z79899 Other long term (current) drug therapy: Secondary | ICD-10-CM | POA: Insufficient documentation

## 2018-03-22 DIAGNOSIS — Z825 Family history of asthma and other chronic lower respiratory diseases: Secondary | ICD-10-CM | POA: Diagnosis not present

## 2018-03-22 DIAGNOSIS — Z811 Family history of alcohol abuse and dependence: Secondary | ICD-10-CM | POA: Insufficient documentation

## 2018-03-22 DIAGNOSIS — F1910 Other psychoactive substance abuse, uncomplicated: Secondary | ICD-10-CM | POA: Diagnosis not present

## 2018-03-22 DIAGNOSIS — M19042 Primary osteoarthritis, left hand: Secondary | ICD-10-CM

## 2018-03-22 DIAGNOSIS — Z85118 Personal history of other malignant neoplasm of bronchus and lung: Secondary | ICD-10-CM

## 2018-03-22 DIAGNOSIS — Z8249 Family history of ischemic heart disease and other diseases of the circulatory system: Secondary | ICD-10-CM | POA: Insufficient documentation

## 2018-03-22 DIAGNOSIS — I110 Hypertensive heart disease with heart failure: Secondary | ICD-10-CM | POA: Diagnosis present

## 2018-03-22 DIAGNOSIS — Z7982 Long term (current) use of aspirin: Secondary | ICD-10-CM

## 2018-03-22 DIAGNOSIS — D7589 Other specified diseases of blood and blood-forming organs: Secondary | ICD-10-CM

## 2018-03-22 DIAGNOSIS — Z801 Family history of malignant neoplasm of trachea, bronchus and lung: Secondary | ICD-10-CM

## 2018-03-22 DIAGNOSIS — R0689 Other abnormalities of breathing: Secondary | ICD-10-CM

## 2018-03-22 DIAGNOSIS — R062 Wheezing: Secondary | ICD-10-CM | POA: Diagnosis not present

## 2018-03-22 DIAGNOSIS — J9622 Acute and chronic respiratory failure with hypercapnia: Secondary | ICD-10-CM

## 2018-03-22 DIAGNOSIS — I10 Essential (primary) hypertension: Secondary | ICD-10-CM

## 2018-03-22 DIAGNOSIS — R4020 Unspecified coma: Secondary | ICD-10-CM | POA: Diagnosis not present

## 2018-03-22 DIAGNOSIS — E860 Dehydration: Secondary | ICD-10-CM | POA: Diagnosis not present

## 2018-03-22 DIAGNOSIS — R4182 Altered mental status, unspecified: Secondary | ICD-10-CM | POA: Diagnosis present

## 2018-03-22 DIAGNOSIS — E039 Hypothyroidism, unspecified: Secondary | ICD-10-CM | POA: Insufficient documentation

## 2018-03-22 DIAGNOSIS — Z833 Family history of diabetes mellitus: Secondary | ICD-10-CM | POA: Diagnosis not present

## 2018-03-22 DIAGNOSIS — M19041 Primary osteoarthritis, right hand: Secondary | ICD-10-CM

## 2018-03-22 DIAGNOSIS — F1411 Cocaine abuse, in remission: Secondary | ICD-10-CM | POA: Diagnosis present

## 2018-03-22 DIAGNOSIS — J449 Chronic obstructive pulmonary disease, unspecified: Secondary | ICD-10-CM | POA: Diagnosis present

## 2018-03-22 DIAGNOSIS — F418 Other specified anxiety disorders: Secondary | ICD-10-CM

## 2018-03-22 DIAGNOSIS — I2781 Cor pulmonale (chronic): Secondary | ICD-10-CM | POA: Diagnosis present

## 2018-03-22 DIAGNOSIS — M810 Age-related osteoporosis without current pathological fracture: Secondary | ICD-10-CM | POA: Diagnosis present

## 2018-03-22 DIAGNOSIS — M5416 Radiculopathy, lumbar region: Secondary | ICD-10-CM | POA: Insufficient documentation

## 2018-03-22 DIAGNOSIS — Z823 Family history of stroke: Secondary | ICD-10-CM

## 2018-03-22 DIAGNOSIS — I5032 Chronic diastolic (congestive) heart failure: Secondary | ICD-10-CM | POA: Diagnosis present

## 2018-03-22 DIAGNOSIS — E43 Unspecified severe protein-calorie malnutrition: Secondary | ICD-10-CM | POA: Diagnosis present

## 2018-03-22 DIAGNOSIS — E0500 Thyrotoxicosis with diffuse goiter without thyrotoxic crisis or storm: Secondary | ICD-10-CM | POA: Insufficient documentation

## 2018-03-22 DIAGNOSIS — Z7951 Long term (current) use of inhaled steroids: Secondary | ICD-10-CM | POA: Insufficient documentation

## 2018-03-22 DIAGNOSIS — E872 Acidosis: Secondary | ICD-10-CM | POA: Diagnosis present

## 2018-03-22 DIAGNOSIS — R410 Disorientation, unspecified: Secondary | ICD-10-CM | POA: Diagnosis not present

## 2018-03-22 DIAGNOSIS — Z86718 Personal history of other venous thrombosis and embolism: Secondary | ICD-10-CM | POA: Insufficient documentation

## 2018-03-22 DIAGNOSIS — Z9981 Dependence on supplemental oxygen: Secondary | ICD-10-CM

## 2018-03-22 DIAGNOSIS — Z9049 Acquired absence of other specified parts of digestive tract: Secondary | ICD-10-CM | POA: Diagnosis not present

## 2018-03-22 DIAGNOSIS — J432 Centrilobular emphysema: Secondary | ICD-10-CM | POA: Diagnosis not present

## 2018-03-22 DIAGNOSIS — J309 Allergic rhinitis, unspecified: Secondary | ICD-10-CM | POA: Insufficient documentation

## 2018-03-22 DIAGNOSIS — Z8759 Personal history of other complications of pregnancy, childbirth and the puerperium: Secondary | ICD-10-CM | POA: Diagnosis not present

## 2018-03-22 DIAGNOSIS — R0602 Shortness of breath: Secondary | ICD-10-CM | POA: Diagnosis not present

## 2018-03-22 DIAGNOSIS — R402441 Other coma, without documented Glasgow coma scale score, or with partial score reported, in the field [EMT or ambulance]: Secondary | ICD-10-CM | POA: Diagnosis not present

## 2018-03-22 DIAGNOSIS — R29810 Facial weakness: Secondary | ICD-10-CM | POA: Diagnosis not present

## 2018-03-22 DIAGNOSIS — Z87891 Personal history of nicotine dependence: Secondary | ICD-10-CM | POA: Diagnosis not present

## 2018-03-22 DIAGNOSIS — R0902 Hypoxemia: Secondary | ICD-10-CM | POA: Diagnosis not present

## 2018-03-22 DIAGNOSIS — Z90721 Acquired absence of ovaries, unilateral: Secondary | ICD-10-CM | POA: Diagnosis not present

## 2018-03-22 DIAGNOSIS — R4781 Slurred speech: Secondary | ICD-10-CM | POA: Diagnosis not present

## 2018-03-22 DIAGNOSIS — Z6823 Body mass index (BMI) 23.0-23.9, adult: Secondary | ICD-10-CM | POA: Diagnosis not present

## 2018-03-22 DIAGNOSIS — G9349 Other encephalopathy: Secondary | ICD-10-CM | POA: Diagnosis present

## 2018-03-22 DIAGNOSIS — E890 Postprocedural hypothyroidism: Secondary | ICD-10-CM | POA: Diagnosis present

## 2018-03-22 LAB — RAPID URINE DRUG SCREEN, HOSP PERFORMED
Amphetamines: NOT DETECTED
BENZODIAZEPINES: POSITIVE — AB
Barbiturates: NOT DETECTED
COCAINE: NOT DETECTED
OPIATES: NOT DETECTED
Tetrahydrocannabinol: NOT DETECTED

## 2018-03-22 LAB — CBC
HCT: 48 % — ABNORMAL HIGH (ref 36.0–46.0)
HEMOGLOBIN: 14 g/dL (ref 12.0–15.0)
MCH: 31.3 pg (ref 26.0–34.0)
MCHC: 29.2 g/dL — AB (ref 30.0–36.0)
MCV: 107.1 fL — ABNORMAL HIGH (ref 78.0–100.0)
PLATELETS: 155 10*3/uL (ref 150–400)
RBC: 4.48 MIL/uL (ref 3.87–5.11)
RDW: 13.5 % (ref 11.5–15.5)
WBC: 4.6 10*3/uL (ref 4.0–10.5)

## 2018-03-22 LAB — COMPREHENSIVE METABOLIC PANEL
ALK PHOS: 56 U/L (ref 38–126)
ALT: 12 U/L — ABNORMAL LOW (ref 14–54)
AST: 24 U/L (ref 15–41)
Albumin: 4.4 g/dL (ref 3.5–5.0)
Anion gap: 19 — ABNORMAL HIGH (ref 5–15)
BILIRUBIN TOTAL: 1.2 mg/dL (ref 0.3–1.2)
BUN: 11 mg/dL (ref 6–20)
CALCIUM: 9.8 mg/dL (ref 8.9–10.3)
CO2: 39 mmol/L — ABNORMAL HIGH (ref 22–32)
Chloride: 84 mmol/L — ABNORMAL LOW (ref 101–111)
Creatinine, Ser: 0.62 mg/dL (ref 0.44–1.00)
GFR calc non Af Amer: >60 mL/min (ref >60)
GLUCOSE: 115 mg/dL — AB (ref 65–99)
Potassium: 4.3 mmol/L (ref 3.5–5.1)
Sodium: 142 mmol/L (ref 135–145)
TOTAL PROTEIN: 8.1 g/dL (ref 6.5–8.1)

## 2018-03-22 LAB — URINALYSIS, ROUTINE W REFLEX MICROSCOPIC
BACTERIA UA: NONE SEEN
Bilirubin Urine: NEGATIVE
Glucose, UA: NEGATIVE mg/dL
Hgb urine dipstick: NEGATIVE
Ketones, ur: 20 mg/dL — AB
NITRITE: NEGATIVE
PROTEIN: 100 mg/dL — AB
SPECIFIC GRAVITY, URINE: 1.025 (ref 1.005–1.030)
pH: 6 (ref 5.0–8.0)

## 2018-03-22 LAB — BLOOD GAS, ARTERIAL
Acid-Base Excess: 19.8 mmol/L — ABNORMAL HIGH (ref 0.0–2.0)
Bicarbonate: 51 mmol/L — ABNORMAL HIGH (ref 20.0–28.0)
DRAWN BY: 257701
O2 Content: 4 L/min
O2 SAT: 96.1 %
Patient temperature: 98.6
pCO2 arterial: 102 mmHg (ref 32.0–48.0)
pH, Arterial: 7.319 — ABNORMAL LOW (ref 7.350–7.450)
pO2, Arterial: 83.7 mmHg (ref 83.0–108.0)

## 2018-03-22 LAB — I-STAT TROPONIN, ED: TROPONIN I, POC: 0.02 ng/mL (ref 0.00–0.08)

## 2018-03-22 LAB — MRSA PCR SCREENING: MRSA by PCR: NEGATIVE

## 2018-03-22 MED ORDER — ORAL CARE MOUTH RINSE: 15.0000 mL | Freq: Two times a day (BID) | OROMUCOSAL | Status: DC

## 2018-03-22 MED ORDER — LORAZEPAM 2 MG/ML IJ SOLN: 1.0000 mg | Freq: Once | INTRAMUSCULAR | Status: DC

## 2018-03-22 MED ORDER — CITALOPRAM HYDROBROMIDE 10 MG PO TABS
20.0000 mg | ORAL_TABLET | Freq: Every day | ORAL | Status: DC
  Administered 2018-03-23: 20 mg via ORAL
  Filled 2018-03-22: qty 2

## 2018-03-22 MED ORDER — IPRATROPIUM-ALBUTEROL 0.5-2.5 (3) MG/3ML IN SOLN
3.0000 mL | Freq: Once | RESPIRATORY_TRACT | Status: AC
  Administered 2018-03-22: 3 mL via RESPIRATORY_TRACT
  Filled 2018-03-22: qty 3

## 2018-03-22 MED ORDER — ASPIRIN EC 81 MG PO TBEC
81.0000 mg | DELAYED_RELEASE_TABLET | Freq: Every day | ORAL | Status: DC
  Administered 2018-03-23: 81 mg via ORAL
  Filled 2018-03-22 (×2): qty 1

## 2018-03-22 MED ORDER — ALPRAZOLAM 0.25 MG PO TABS
0.2500 mg | ORAL_TABLET | Freq: Two times a day (BID) | ORAL | Status: DC
  Administered 2018-03-23: 0.25 mg via ORAL
  Filled 2018-03-22: qty 1

## 2018-03-22 MED ORDER — ALBUTEROL SULFATE (2.5 MG/3ML) 0.083% IN NEBU: 2.5000 mg | INHALATION_SOLUTION | RESPIRATORY_TRACT | Status: DC | PRN

## 2018-03-22 MED ORDER — SODIUM CHLORIDE 0.9 % IV BOLUS
500.0000 mL | Freq: Once | INTRAVENOUS | Status: AC
  Administered 2018-03-22: 500 mL via INTRAVENOUS

## 2018-03-22 MED ORDER — CHLORHEXIDINE GLUCONATE 0.12 % MT SOLN
15.0000 mL | Freq: Two times a day (BID) | OROMUCOSAL | Status: DC
  Administered 2018-03-22 – 2018-03-23 (×2): 15 mL via OROMUCOSAL
  Filled 2018-03-22: qty 15

## 2018-03-22 MED ORDER — LEVOTHYROXINE SODIUM 75 MCG PO TABS
75.0000 ug | ORAL_TABLET | Freq: Every day | ORAL | Status: DC
  Administered 2018-03-23: 75 ug via ORAL
  Filled 2018-03-22: qty 1

## 2018-03-22 MED ORDER — ALBUTEROL SULFATE (2.5 MG/3ML) 0.083% IN NEBU
5.0000 mg | INHALATION_SOLUTION | Freq: Once | RESPIRATORY_TRACT | Status: AC
  Administered 2018-03-22: 5 mg via RESPIRATORY_TRACT
  Filled 2018-03-22: qty 6

## 2018-03-22 MED ORDER — LORAZEPAM 2 MG/ML IJ SOLN
1.0000 mg | Freq: Once | INTRAMUSCULAR | Status: AC
  Administered 2018-03-22: 1 mg via INTRAVENOUS
  Filled 2018-03-22: qty 1

## 2018-03-22 MED ORDER — IPRATROPIUM-ALBUTEROL 0.5-2.5 (3) MG/3ML IN SOLN
3.0000 mL | Freq: Four times a day (QID) | RESPIRATORY_TRACT | Status: DC
  Administered 2018-03-22 – 2018-03-23 (×3): 3 mL via RESPIRATORY_TRACT
  Filled 2018-03-22 (×4): qty 3

## 2018-03-22 NOTE — ED Notes (Signed)
ED TO INPATIENT HANDOFF REPORT  Name/Age/Gender Jasmine [name] 72 y.o. female  Code Status Code Status History    Date Active Date Inactive Code Status Order ID Comments User Context   01/07/2018 1152 01/09/2018 1710 Full Code [id number]  Marijean Heath, NP ED   05/21/2016 0149 05/23/2016 2005 Full Code [id number]  Karmen Bongo, MD Inpatient   03/24/2016 1358 03/27/2016 2121 Full Code [id number]  Rondel Jumbo, PA-C ED   12/28/2015 0101 01/01/2016 1619 Full Code [id number]  Rise Patience, MD Inpatient   12/25/2015 0103 12/27/2015 1559 Full Code [id number]  Reubin Milan, MD Inpatient   06/20/2014 0407 06/25/2014 2238 Full Code [id number]  Toy Baker, MD Inpatient   11/09/2011 2234 11/12/2011 1949 Full Code [id number]  Suzie Portela, RN Inpatient      Home/SNF/Other Home  Chief Complaint Shortness of Breath   Level of Care/Admitting Diagnosis ED Disposition    ED Disposition Condition [place]: [facility] [[id number]]  Level of Care: Stepdown [14]  Admit to SDU based on following criteria: Respiratory Distress:  Frequent assessment and/or intervention to maintain adequate ventilation/respiration, pulmonary toilet, and respiratory treatment.  Diagnosis: Hypercarbia [122482]  Admitting Physician: Debbe Odea [[id number]]  Attending Physician: Debbe Odea [[id number]]  PT Class (Do Not Modify): Observation [104]  PT Acc Code (Do Not Modify): Observation [10022]       Medical History Past Medical History:  Diagnosis Date  . ALLERGIC RHINITIS 05/01/2007  . Anxiety   . Arthritis    "hands" (12/28/2015)  . ASTHMA 05/01/2007   "since I was a child"  . Cancer (HCC)    LUNG  . CHF (congestive heart failure) ([place])   . Chronic bronchitis ([place])   . COPD (chronic obstructive pulmonary disease) ([place])   . DEPRESSION 07/21/2008  . DVT (deep venous thrombosis) (HCC) 1960s   LLE  . FATIGUE 10/10/2010  . HYPERTENSION 05/01/2007  .  HYPERTHYROIDISM 11/23/2007   Pt endorses having had Graves disease, possibly radioactive iodine x1, but no thyroidectomy  . HYPOTHYROIDISM 08/23/2009  . LUMBAR RADICULOPATHY, RIGHT 08/25/2008  . On home oxygen therapy    "3-4L; qd; all the time" (12/28/2015)  . OSTEOPOROSIS 05/01/2007  . SHOULDER PAIN, LEFT 08/23/2009  . SINUSITIS, CHRONIC 10/10/2010    Allergies Allergies  Allergen Reactions  . Alendronate Sodium Other (See Comments)    Pt does not remember this reaction    IV Location/Drains/Wounds Patient Lines/Drains/Airways Status   Active Line/Drains/Airways    Name:   Placement date:   Placement time:   Site:   Days:   Peripheral IV 03/22/18 Right;Anterior Forearm   03/22/18    1549    Forearm   less than 1          Labs/Imaging Results for orders placed or performed during the hospital encounter of 03/22/18 (from the past 48 hour(s))  Comprehensive metabolic panel     Status: Abnormal   Collection Time: 03/22/18  2:00 PM  Result Value Ref Range   Sodium 142 135 - 145 mmol/L   Potassium 4.3 3.5 - 5.1 mmol/L   Chloride 84 (L) 101 - 111 mmol/L   CO2 39 (H) 22 - 32 mmol/L   Glucose, Bld 115 (H) 65 - 99 mg/dL   BUN 11 6 - 20 mg/dL   Creatinine, Ser 0.62 0.44 - 1.00 mg/dL   Calcium 9.8 8.9 - 10.3 mg/dL   Total Protein 8.1 6.5 - 8.1 g/dL  Albumin 4.4 3.5 - 5.0 g/dL   AST 24 15 - 41 U/L   ALT 12 (L) 14 - 54 U/L   Alkaline Phosphatase 56 38 - 126 U/L   Total Bilirubin 1.2 0.3 - 1.2 mg/dL   GFR calc non Af Amer >60 >60 mL/min   GFR calc Af Amer >60 >60 mL/min    Comment: (NOTE) The eGFR has been calculated using the CKD EPI equation. This calculation has not been validated in all clinical situations. eGFR's persistently <60 mL/min signify possible Chronic Kidney Disease.    Anion gap 19 (H) 5 - 15    Comment: Performed at [facility], [place] [address], [place], [place] [address]  CBC     Status: Abnormal   Collection Time: 03/22/18  2:00 PM  Result  Value Ref Range   WBC 4.6 4.0 - 10.5 K/uL   RBC 4.48 3.87 - 5.11 MIL/uL   Hemoglobin 14.0 12.0 - 15.0 g/dL   HCT 48.0 (H) 36.0 - 46.0 %   MCV 107.1 (H) 78.0 - 100.0 fL   MCH 31.3 26.0 - 34.0 pg   MCHC 29.2 (L) 30.0 - 36.0 g/dL   RDW 13.5 11.5 - 15.5 %   Platelets 155 150 - 400 K/uL    Comment: Performed at [facility], [place] [address], [place], [place] [address]  I-Stat Troponin, ED (not at [facility])     Status: None   Collection Time: 03/22/18  2:33 PM  Result Value Ref Range   Troponin i, poc 0.02 0.00 - 0.08 ng/mL   Comment 3            Comment: Due to the release kinetics of cTnI, a negative result within the first hours of the onset of symptoms does not rule out myocardial infarction with certainty. If myocardial infarction is still suspected, repeat the test at appropriate intervals.   Blood gas, arterial     Status: Abnormal   Collection Time: 03/22/18  4:09 PM  Result Value Ref Range   O2 Content 4.0 L/min   Delivery systems NASAL CANNULA    pH, Arterial 7.319 (L) 7.350 - 7.450   pCO2 arterial 102 (HH) 32.0 - 48.0 mmHg    Comment: RBV MANDY RN AT 1621 BY DEE WALTERS RRT ON 03/22/18   pO2, Arterial 83.7 83.0 - 108.0 mmHg   Bicarbonate 51.0 (H) 20.0 - 28.0 mmol/L   Acid-Base Excess 19.8 (H) 0.0 - 2.0 mmol/L   O2 Saturation 96.1 %   Patient temperature 98.6    Collection site RIGHT RADIAL    Drawn by [id number]    Sample type ARTERIAL DRAW     Comment: Performed at [facility], [place] [address] [place], [place] [address]   Dg Chest 2 View  Result Date: 03/22/2018 CLINICAL DATA:  72 year old female with altered mental status, shortness of breath. EXAM: CHEST - 2 VIEW COMPARISON:  Chest radiographs 01/08/2018 and earlier. Chest CT 12/16/2017 FINDINGS: Stable lung volumes. Chronic scarring along the superior aspect of the minor fissure is unchanged. Centrilobular emphysema. No pneumothorax, pulmonary edema, pleural effusion or acute pulmonary  opacity. Tortuous thoracic aorta. Other mediastinal contours are within normal limits. Visualized tracheal air column is within normal limits. No acute osseous abnormality identified. Negative visible bowel gas pattern. IMPRESSION: Emphysema (ICD10-J43.9). No acute cardiopulmonary abnormality. Electronically Signed   By: Genevie Ann M.D.   On: 03/22/2018 14:46    Pending Labs Unresulted Labs (From admission, onward)  Start     Ordered   03/22/18 2000  Blood gas, arterial  Once,   R    Comments:  Please call in to on call provider    03/22/18 1738   03/22/18 1647  Urine rapid drug screen (hosp performed)  STAT,   STAT     03/22/18 1646   03/22/18 1506  Urinalysis, Routine w reflex microscopic  STAT,   STAT     03/22/18 1505      Vitals/Pain Today's Vitals   03/22/18 1730 03/22/18 1741 03/22/18 1900 03/22/18 1930  BP: 122/79 122/79 123/70 124/86  Pulse: 76 67 74 65  Resp: 14 13 16  (!) 21  Temp:      TempSrc:      SpO2: 94% 92% 94% (!) 89%  Weight:        Isolation Precautions No active isolations  Medications Medications  ALPRAZolam (XANAX) tablet 0.25 mg (has no administration in time range)  aspirin EC tablet 81 mg (has no administration in time range)  citalopram (CELEXA) tablet 20 mg (has no administration in time range)  levothyroxine (SYNTHROID, LEVOTHROID) tablet 75 mcg (has no administration in time range)  ipratropium-albuterol (DUONEB) 0.5-2.5 (3) MG/3ML nebulizer solution 3 mL (has no administration in time range)  albuterol (PROVENTIL) (2.5 MG/3ML) 0.083% nebulizer solution 2.5 mg (has no administration in time range)  albuterol (PROVENTIL) (2.5 MG/3ML) 0.083% nebulizer solution 5 mg (5 mg Nebulization Given 03/22/18 1539)  sodium chloride 0.9 % bolus 500 mL (0 mLs Intravenous Stopped 03/22/18 1732)  ipratropium-albuterol (DUONEB) 0.5-2.5 (3) MG/3ML nebulizer solution 3 mL (3 mLs Nebulization Given 03/22/18 1539)  sodium chloride 0.9 % bolus 500 mL (500 mLs Intravenous New  Bag/Given 03/22/18 1745)    Mobility walks with person assist

## 2018-03-22 NOTE — H&P (Signed)
History and Physical    Jasmine [name] XBD:[id number] DOB: 1946-01-09 DOA: 03/22/2018    PCP: Biagio Borg, MD  Patient coming from: home  Chief Complaint: sent by husband for confusion  HPI: Jasmine [name] is a 72 y.o. female with medical history of COPD on 3-4 L of O2, cocaine and tobacco abuse in the past, HTN, cor pulmonale. She simply states her husband sent her to the ED. In the ED, she thinks is May, 1920. She is not sure why she is in the ED.  She was not able to use her phone properly to call her husband in the room. She was thinking the EKG lead was her Pensions consultant. Upon talking to her husband on the phone, he states she was trying to change her O2 from the stationary to the portable tank but was it was not working. She was quite confused but it improved after EMS put her back on 4 L. She states she last used Cocaine in March and has not smoked in years.   ED Course: pH 7.39, PCO2 102  Review of Systems:  All other systems reviewed and apart from HPI, are negative.  Past Medical History:  Diagnosis Date  . ALLERGIC RHINITIS 05/01/2007  . Anxiety   . Arthritis    "hands" (12/28/2015)  . ASTHMA 05/01/2007   "since I was a child"  . Cancer (HCC)    LUNG  . CHF (congestive heart failure) ([place])   . Chronic bronchitis ([place])   . COPD (chronic obstructive pulmonary disease) ([place])   . DEPRESSION 07/21/2008  . DVT (deep venous thrombosis) (HCC) 1960s   LLE  . FATIGUE 10/10/2010  . HYPERTENSION 05/01/2007  . HYPERTHYROIDISM 11/23/2007   Pt endorses having had Graves disease, possibly radioactive iodine x1, but no thyroidectomy  . HYPOTHYROIDISM 08/23/2009  . LUMBAR RADICULOPATHY, RIGHT 08/25/2008  . On home oxygen therapy    "3-4L; qd; all the time" (12/28/2015)  . OSTEOPOROSIS 05/01/2007  . SHOULDER PAIN, LEFT 08/23/2009  . SINUSITIS, CHRONIC 10/10/2010    Past Surgical History:  Procedure Laterality Date  . APPENDECTOMY    . DILATION AND CURETTAGE OF UTERUS  multiple   history of multiple dialations and curettages and miscarriages, unfortunately never carrying a child to term  . ECTOPIC PREGNANCY SURGERY  "late '60s or early '70s  . ELECTROCARDIOGRAM  06/20/2006  . OOPHORECTOMY Right     Social History:   reports that she quit smoking about 2 years ago. Her smoking use included cigarettes. She has a 54.00 pack-year smoking history. She has never used smokeless tobacco. She reports that she drinks alcohol. She reports that she has current or past drug history. Drug: "Crack" cocaine. Frequency: 2.00 times per week.  Allergies  Allergen Reactions  . Alendronate Sodium Other (See Comments)    Pt does not remember this reaction    Family History  Problem Relation Age of Onset  . Lung cancer Father   . Alcohol abuse Brother   . Diabetes Brother   . Hypertension Other   . Stroke Mother   . Thyroid disease Mother   . Asthma Unknown        maternal aunts     Prior to Admission medications   Medication Sig Start Date End Date Taking? Authorizing Provider  albuterol (PROAIR HFA) 108 (90 Base) MCG/ACT inhaler Inhale 1-2 puffs into the lungs every 6 (six) hours as needed for wheezing or shortness of breath. 12/17/17   Rigoberto Noel,  MD  albuterol (PROVENTIL) (2.5 MG/3ML) 0.083% nebulizer solution Take 3 mLs (2.5 mg total) by nebulization every 4 (four) hours as needed for wheezing. 06/03/17   Biagio Borg, MD  ALPRAZolam Duanne Moron) 0.25 MG tablet Take 1 tablet (0.25 mg total) by mouth 2 (two) times daily. 10/22/17   Biagio Borg, MD  aspirin EC 81 MG EC tablet Take 1 tablet (81 mg total) by mouth daily. 03/27/16   Velvet Bathe, MD  citalopram (CELEXA) 20 MG tablet TAKE 1 TABLET BY MOUTH DAILY Patient taking differently: TAKE 1 TABLET (20 MG)  BY MOUTH DAILY 10/21/17   Biagio Borg, MD  feeding supplement, ENSURE ENLIVE, (ENSURE ENLIVE) LIQD Take 237 mLs by mouth 3 (three) times daily between meals. 12/31/15   Florencia Reasons, MD  fluticasone furoate-vilanterol (BREO  ELLIPTA) 200-25 MCG/INH AEPB Inhale 1 puff into the lungs daily. 12/17/17   Rigoberto Noel, MD  levothyroxine (SYNTHROID, LEVOTHROID) 75 MCG tablet TAKE 1 TABLET BY MOUTH DAILY BEFORE BREAKFAST. 03/19/18   Biagio Borg, MD  OXYGEN Inhale 4 L into the lungs continuous.     [provider]  potassium chloride (K-DUR) 10 MEQ tablet Take 1 tablet (10 mEq total) by mouth 2 (two) times daily. 06/03/17   Biagio Borg, MD  tiotropium (SPIRIVA HANDIHALER) 18 MCG inhalation capsule Place 1 capsule (18 mcg total) into inhaler and inhale daily. 12/17/17   Rigoberto Noel, MD    Physical Exam: Wt Readings from Last 3 Encounters:  03/22/18 49.4 kg (109 lb)  02/18/18 49.4 kg (109 lb)  01/26/18 51 kg (112 lb 6.4 oz)   Vitals:   03/22/18 1500 03/22/18 1536 03/22/18 1600 03/22/18 1630  BP:  114/63 (!) 117/51 (!) 124/92  Pulse: 74 79    Resp: 14 19 18 14   Temp:      TempSrc:      SpO2: 96% 97%    Weight:          Constitutional: NAD, calm, comfortable Eyes: PERTLA, lids and conjunctivae normal ENMT: Mucous membranes are dry- lips very dry. Posterior pharynx clear of any exudate or lesions. Normal dentition.  Neck: normal, supple, no masses, no thyromegaly Respiratory: clear to auscultation bilaterally, no wheezing, no crackles. Normal respiratory effort. No accessory muscle use.  Cardiovascular: S1 & S2 heard, regular rate and rhythm, no murmurs / rubs / gallops. No extremity edema. 2+ pedal pulses. No carotid bruits.  Abdomen: No distension, no tenderness, no masses palpated. No hepatosplenomegaly. Bowel sounds normal.  Musculoskeletal: no clubbing / cyanosis. No joint deformity upper and lower extremities. Good ROM, no contractures. Normal muscle tone.  Skin: no rashes, lesions, ulcers. No induration Neurologic: CN 2-12 grossly intact. Sensation intact, DTR normal. Strength 5/5 in all 4 limbs.  Psychiatric: confused to time and situation-. Normal mood.     Labs on Admission: I have  personally reviewed following labs and imaging studies  CBC: Recent Labs  Lab 03/22/18 1400  WBC 4.6  HGB 14.0  HCT 48.0*  MCV 107.1*  PLT 735   Basic Metabolic Panel: Recent Labs  Lab 03/22/18 1400  NA 142  K 4.3  CL 84*  CO2 39*  GLUCOSE 115*  BUN 11  CREATININE 0.62  CALCIUM 9.8   GFR: Estimated Creatinine Clearance: 45 mL/min (by C-G formula based on SCr of 0.62 mg/dL). Liver Function Tests: Recent Labs  Lab 03/22/18 1400  AST 24  ALT 12*  ALKPHOS 56  BILITOT 1.2  PROT 8.1  ALBUMIN 4.4   No results for input(s): LIPASE, AMYLASE in the last 168 hours. No results for input(s): AMMONIA in the last 168 hours. Coagulation Profile: No results for input(s): INR, PROTIME in the last 168 hours. Cardiac Enzymes: No results for input(s): CKTOTAL, CKMB, CKMBINDEX, TROPONINI in the last 168 hours. BNP (last 3 results) No results for input(s): PROBNP in the last 8760 hours. HbA1C: No results for input(s): HGBA1C in the last 72 hours. CBG: No results for input(s): GLUCAP in the last 168 hours. Lipid Profile: No results for input(s): CHOL, HDL, LDLCALC, TRIG, CHOLHDL, LDLDIRECT in the last 72 hours. Thyroid Function Tests: No results for input(s): TSH, T4TOTAL, FREET4, T3FREE, THYROIDAB in the last 72 hours. Anemia Panel: No results for input(s): VITAMINB12, FOLATE, FERRITIN, TIBC, IRON, RETICCTPCT in the last 72 hours. Urine analysis:    Component Value Date/Time   COLORURINE YELLOW 01/07/2018 0658   APPEARANCEUR CLEAR 01/07/2018 0658   LABSPEC 1.021 01/07/2018 0658   PHURINE 6.0 01/07/2018 0658   GLUCOSEU NEGATIVE 01/07/2018 0658   GLUCOSEU NEGATIVE 01/11/2015 1723   HGBUR NEGATIVE 01/07/2018 0658   BILIRUBINUR NEGATIVE 01/07/2018 0658   BILIRUBINUR negative 08/11/2011 1358   KETONESUR 5 (A) 01/07/2018 0658   PROTEINUR 100 (A) 01/07/2018 0658   UROBILINOGEN 0.2 01/11/2015 1723   NITRITE NEGATIVE 01/07/2018 0658   LEUKOCYTESUR NEGATIVE 01/07/2018 0658    Sepsis Labs: @LABRCNTIP (procalcitonin:4,lacticidven:4) )No results found for this or any previous visit (from the past 240 hour(s)).   Radiological Exams on Admission: Dg Chest 2 View  Result Date: 03/22/2018 CLINICAL DATA:  71 year old female with altered mental status, shortness of breath. EXAM: CHEST - 2 VIEW COMPARISON:  Chest radiographs 01/08/2018 and earlier. Chest CT 12/16/2017 FINDINGS: Stable lung volumes. Chronic scarring along the superior aspect of the minor fissure is unchanged. Centrilobular emphysema. No pneumothorax, pulmonary edema, pleural effusion or acute pulmonary opacity. Tortuous thoracic aorta. Other mediastinal contours are within normal limits. Visualized tracheal air column is within normal limits. No acute osseous abnormality identified. Negative visible bowel gas pattern. IMPRESSION: Emphysema (ICD10-J43.9). No acute cardiopulmonary abnormality. Electronically Signed   By: Genevie Ann M.D.   On: 03/22/2018 14:46    EKG: Independently reviewed. NSR  Assessment/Plan Principal Problem:   COPD (chronic obstructive pulmonary disease) , hypercarbic resp failure - mild hypercarbia should hopefully correct after a few hrs on the Bipap - cont home Nebs - after BiPAP is removed,  keep O2 weaned down to keep pulse ox 90-92% - CXR unrevealing  Active Problems:   Dehydration - very dehydrated- has received 500 cc NS in ED- hold lasix- give 500 cc more and cont NS at 75 cc/hr until she is off BiPAP and able to eat and drink  - last ECHO shows EF 60%, grade 1 d CHF and no right heart failure-   I would decrease her to 40 mg daily on discharge - Hb appears hemoconcentrated at 14 as her baseline is 12.   H/o Cocaine use - check UDS  Hypothyroid - cont Synthroid  Depression/ anxiety - on Xanax 0.25 BID (checked in [facility]) and Celexa at home    DVT prophylaxis: Lovenox  Code Status: Full code  Family Communication: husband  Disposition Plan: hopefully d/c  home tomorrow  Consults called: none  Admission status: observation    Debbe Odea MD [facility] Pager: [email or web address] Password [email or web address] 7PM-7AM, please contact night-coverage   03/22/2018, 5:23 PM

## 2018-03-22 NOTE — ED Provider Notes (Signed)
[facility] DEPT Provider Note   CSN: [id number] Arrival date & time: 03/22/18  1327  LEVEL 5 CAVEAT - ALTERED MENTAL STATUS   History   Chief Complaint Chief Complaint  Patient presents with  . Altered Mental Status    HPI Jasmine [name] is a 72 y.o. female.  HPI  72 year old female with a history of COPD, CHF presents with altered mental status.  The history is quite limited as the patient is confused and there is no family or EMS present when I am seeing the patient.  Apparently EMS was called by the husband for altered mental status. When the fire department arrived her oxygen was not in place.  They state that her altered mental status seemed to improve with oxygen being placed back in her nose.  She is normally on 4 L nasal cannula.  She was given a breathing treatment with some improvement in lung sounds.  The patient does endorse that she was short of breath earlier and knows that her husband called EMS but otherwise is somewhat confused as to why she is here.  However she knows she is in the emergency department.  She does endorse some chest pain although she has a hard time describing what it  feels like.  She denies a headache.  Past Medical History:  Diagnosis Date  . ALLERGIC RHINITIS 05/01/2007  . Anxiety   . Arthritis    "hands" (12/28/2015)  . ASTHMA 05/01/2007   "since I was a child"  . Cancer (HCC)    LUNG  . CHF (congestive heart failure) ([place])   . Chronic bronchitis ([place])   . COPD (chronic obstructive pulmonary disease) ([place])   . DEPRESSION 07/21/2008  . DVT (deep venous thrombosis) (HCC) 1960s   LLE  . FATIGUE 10/10/2010  . HYPERTENSION 05/01/2007  . HYPERTHYROIDISM 11/23/2007   Pt endorses having had Graves disease, possibly radioactive iodine x1, but no thyroidectomy  . HYPOTHYROIDISM 08/23/2009  . LUMBAR RADICULOPATHY, RIGHT 08/25/2008  . On home oxygen therapy    "3-4L; qd; all the time" (12/28/2015)  . OSTEOPOROSIS 05/01/2007    . SHOULDER PAIN, LEFT 08/23/2009  . SINUSITIS, CHRONIC 10/10/2010    Patient Active Problem List   Diagnosis Date Noted  . Cerumen impaction 02/18/2018  . Polysubstance abuse ([place]) 01/26/2018  . Acute on chronic respiratory failure ([place]) 01/07/2018  . Aspiration into airway 06/03/2017  . Insect bite 07/12/2016  . Rash 07/12/2016  . Bilateral hearing loss 06/05/2016  . Hyperglycemia 05/21/2016  . Abnormal LFTs 03/24/2016  . Elevated blood sugar 03/24/2016  . Solitary pulmonary nodule 01/09/2016  . Chronic respiratory failure with hypoxia ([place])   . On home oxygen therapy   . Chronic diastolic congestive heart failure ([place])   . Staphylococcus aureus bacteremia 12/28/2015  . Bacteremia Step viridans 12/27/2015  . Tobacco abuse disorder 12/25/2015  . Hypothyroidism following radioiodine therapy 09/26/2014  . Protein-calorie malnutrition, severe ([place]) 06/23/2014  . Cor pulmonale ([place]) 01/12/2013  . COPD (chronic obstructive pulmonary disease) ([place]) 11/29/2011  . Macrocytosis without anemia 11/09/2011  . SINUSITIS, CHRONIC 10/10/2010  . FATIGUE 10/10/2010  . Depression with anxiety 07/21/2008  . Essential hypertension 05/01/2007  . ALLERGIC RHINITIS 05/01/2007  . ASTHMA 05/01/2007  . OSTEOPOROSIS 05/01/2007    Past Surgical History:  Procedure Laterality Date  . APPENDECTOMY    . DILATION AND CURETTAGE OF UTERUS  multiple   history of multiple dialations and curettages and miscarriages, unfortunately never carrying a child to  term  . ECTOPIC PREGNANCY SURGERY  "late '60s or early '70s  . ELECTROCARDIOGRAM  06/20/2006  . OOPHORECTOMY Right      OB History    Gravida  5   Para      Term      Preterm      AB  5   Living        SAB  4   TAB      Ectopic  1   Multiple      Live Births               Home Medications    Prior to Admission medications   Medication Sig Start Date End Date Taking? Authorizing Provider  albuterol (PROAIR HFA) 108 (90  Base) MCG/ACT inhaler Inhale 1-2 puffs into the lungs every 6 (six) hours as needed for wheezing or shortness of breath. 12/17/17   Rigoberto Noel, MD  albuterol (PROVENTIL) (2.5 MG/3ML) 0.083% nebulizer solution Take 3 mLs (2.5 mg total) by nebulization every 4 (four) hours as needed for wheezing. 06/03/17   Biagio Borg, MD  ALPRAZolam Duanne Moron) 0.25 MG tablet Take 1 tablet (0.25 mg total) by mouth 2 (two) times daily. 10/22/17   Biagio Borg, MD  aspirin EC 81 MG EC tablet Take 1 tablet (81 mg total) by mouth daily. 03/27/16   Velvet Bathe, MD  citalopram (CELEXA) 20 MG tablet TAKE 1 TABLET BY MOUTH DAILY Patient taking differently: TAKE 1 TABLET (20 MG)  BY MOUTH DAILY 10/21/17   Biagio Borg, MD  feeding supplement, ENSURE ENLIVE, (ENSURE ENLIVE) LIQD Take 237 mLs by mouth 3 (three) times daily between meals. 12/31/15   Florencia Reasons, MD  fluticasone furoate-vilanterol (BREO ELLIPTA) 200-25 MCG/INH AEPB Inhale 1 puff into the lungs daily. 12/17/17   Rigoberto Noel, MD  furosemide (LASIX) 40 MG tablet Take 1 tablet (40 mg total) by mouth 2 (two) times daily. 06/03/17   Biagio Borg, MD  levothyroxine (SYNTHROID, LEVOTHROID) 75 MCG tablet TAKE 1 TABLET BY MOUTH DAILY BEFORE BREAKFAST. 03/19/18   Biagio Borg, MD  OXYGEN Inhale 4 L into the lungs continuous.     [provider]  potassium chloride (K-DUR) 10 MEQ tablet Take 1 tablet (10 mEq total) by mouth 2 (two) times daily. 06/03/17   Biagio Borg, MD  tiotropium (SPIRIVA HANDIHALER) 18 MCG inhalation capsule Place 1 capsule (18 mcg total) into inhaler and inhale daily. 12/17/17   Rigoberto Noel, MD    Family History Family History  Problem Relation Age of Onset  . Lung cancer Father   . Alcohol abuse Brother   . Diabetes Brother   . Hypertension Other   . Stroke Mother   . Thyroid disease Mother   . Asthma Unknown        maternal aunts    Social History Social History   Tobacco Use  . Smoking status: Former Smoker    Packs/day: 1.00     Years: 54.00    Pack years: 54.00    Types: Cigarettes    Last attempt to quit: 07/21/2015    Years since quitting: 2.6  . Smokeless tobacco: Never Used  Substance Use Topics  . Alcohol use: Yes    Comment: 1-4 beers per month   . Drug use: Yes    Frequency: 2.0 times per week    Types: "Crack" cocaine    Comment: 12/28/2015 "sometimes weekends and holidays"  Allergies   Alendronate sodium   Review of Systems Review of Systems  Unable to perform ROS: Mental status change     Physical Exam Updated Vital Signs BP 140/66   Pulse 80   Temp 98.5 F (36.9 C) (Oral)   Resp 20   Wt 49.4 kg (109 lb)   SpO2 96%   BMI 21.29 kg/m   Physical Exam  Constitutional: She appears well-developed and well-nourished. No distress.  HENT:  Head: Normocephalic and atraumatic.  Right Ear: External ear normal.  Left Ear: External ear normal.  Nose: Nose normal.  Mouth/Throat: Mucous membranes are dry.  Eyes: Pupils are equal, round, and reactive to light. EOM are normal. Right eye exhibits no discharge. Left eye exhibits no discharge.  Neck: Neck supple.  Cardiovascular: Normal rate, regular rhythm and normal heart sounds.  Pulmonary/Chest: No accessory muscle usage. Tachypnea (mild, RR 20) noted. She has decreased breath sounds (diffuse).  Abdominal: Soft. She exhibits no distension. There is no tenderness.  Musculoskeletal: She exhibits no edema.  Neurological: She is alert.  She is awake, alert, and oriented to person and place.  She is wrong on the day of the week and states that it is June 1.  She answers the year as 1919. CN 3-12 grossly intact. 5/5 strength in all 4 extremities. Grossly normal sensation. Normal finger to nose.   Skin: Skin is warm and dry. She is not diaphoretic.  Nursing note and vitals reviewed.    ED Treatments / Results  Labs (all labs ordered are listed, but only abnormal results are displayed) Labs Reviewed  CBC - Abnormal; Notable for the  following components:      Result Value   HCT 48.0 (*)    MCV 107.1 (*)    MCHC 29.2 (*)    All other components within normal limits  COMPREHENSIVE METABOLIC PANEL  CBG MONITORING, ED  I-STAT TROPONIN, ED    EKG EKG Interpretation  Date/Time:  Monday March 22 2018 13:51:11 EDT Ventricular Rate:  88 PR Interval:    QRS Duration: 73 QT Interval:  364 QTC Calculation: 441 R Axis:   86 Text Interpretation:  Sinus rhythm Right atrial enlargement Probable anterior infarct, age indeterminate no significant change since Mar 2019 Confirmed by Sherwood Gambler [phone]) on 03/22/2018 2:53:40 PM   Radiology Dg Chest 2 View  Result Date: 03/22/2018 CLINICAL DATA:  72 year old female with altered mental status, shortness of breath. EXAM: CHEST - 2 VIEW COMPARISON:  Chest radiographs 01/08/2018 and earlier. Chest CT 12/16/2017 FINDINGS: Stable lung volumes. Chronic scarring along the superior aspect of the minor fissure is unchanged. Centrilobular emphysema. No pneumothorax, pulmonary edema, pleural effusion or acute pulmonary opacity. Tortuous thoracic aorta. Other mediastinal contours are within normal limits. Visualized tracheal air column is within normal limits. No acute osseous abnormality identified. Negative visible bowel gas pattern. IMPRESSION: Emphysema (ICD10-J43.9). No acute cardiopulmonary abnormality. Electronically Signed   By: Genevie Ann M.D.   On: 03/22/2018 14:46    Procedures .Critical Care Performed by: Sherwood Gambler, MD Authorized by: Sherwood Gambler, MD   Critical care provider statement:    Critical care time (minutes):  30   Critical care time was exclusive of:  Separately billable procedures and treating other patients   Critical care was necessary to treat or prevent imminent or life-threatening deterioration of the following conditions:  Respiratory failure and CNS failure or compromise   Critical care was time spent personally by me on the following activities:  Development of treatment plan with patient or surrogate, discussions with consultants, evaluation of patient's response to treatment, examination of patient, obtaining history from patient or surrogate, ordering and performing treatments and interventions, ordering and review of laboratory studies, ordering and review of radiographic studies, pulse oximetry, re-evaluation of patient's condition and review of old charts   (including critical care time)  Medications Ordered in ED Medications  albuterol (PROVENTIL) (2.5 MG/3ML) 0.083% nebulizer solution 5 mg (has no administration in time range)  sodium chloride 0.9 % bolus 500 mL (has no administration in time range)  ipratropium-albuterol (DUONEB) 0.5-2.5 (3) MG/3ML nebulizer solution 3 mL (has no administration in time range)     Initial Impression / Assessment and Plan / ED Course  I have reviewed the triage vital signs and the nursing notes.  Pertinent labs & imaging results that were available during my care of the patient were reviewed by me and considered in my medical decision making (see chart for details).     Patient's altered mental status appears to be due to CO2 narcosis.  This is a recurrent issue for her.  Given no other focal neuro deficit I do not think CT head would be warranted.  She will be placed on BiPAP.  While this is only mildly worse than her typical baseline, given the altered mental status in association with CO2 of 102 I think she needs BiPAP and monitoring.  Hospitalist to admit.  Final Clinical Impressions(s) / ED Diagnoses   Final diagnoses:  Acute on chronic respiratory failure with hypercapnia [facility])    ED Discharge Orders    None       Sherwood Gambler, MD 03/23/18 0001

## 2018-03-22 NOTE — ED Notes (Signed)
IV team at bedside 

## 2018-03-22 NOTE — ED Triage Notes (Signed)
Patient BIB EMS from home with complaints of Altered mental status, per husband. Patient usually lives on [facility], and fire dept reported the patient had her oxygen off when they arrived. Patient's mental status improved with oxygen placement back into patient nose. Patient received duoneb  Nebulizer x2 treatment from EMS. Extremely diminished lung sounds reported by EMS with slight improvement after duonebs. Patient denies chest pain/nausea/vomiting. Upon arrival to [facility], patient is confused in triage.

## 2018-03-22 NOTE — ED Notes (Signed)
RT called. Patient to be placed on Bipap by RT per order. Patient's husband called and updated on patient POC.

## 2018-03-23 DIAGNOSIS — J432 Centrilobular emphysema: Secondary | ICD-10-CM | POA: Diagnosis not present

## 2018-03-23 DIAGNOSIS — J9622 Acute and chronic respiratory failure with hypercapnia: Secondary | ICD-10-CM | POA: Diagnosis not present

## 2018-03-23 DIAGNOSIS — I10 Essential (primary) hypertension: Secondary | ICD-10-CM | POA: Diagnosis not present

## 2018-03-23 DIAGNOSIS — E860 Dehydration: Secondary | ICD-10-CM | POA: Diagnosis not present

## 2018-03-23 DIAGNOSIS — R0689 Other abnormalities of breathing: Secondary | ICD-10-CM | POA: Diagnosis not present

## 2018-03-23 DIAGNOSIS — I5032 Chronic diastolic (congestive) heart failure: Secondary | ICD-10-CM | POA: Diagnosis not present

## 2018-03-23 MED ORDER — FUROSEMIDE 40 MG PO TABS: 40.0000 mg | ORAL_TABLET | Freq: Every day | ORAL | 11 refills | Status: DC

## 2018-03-23 NOTE — Discharge Summary (Signed)
Physician Discharge Summary  JINI HORIUCHI ZOX:[id number] DOB: July 12, 1946 DOA: 03/22/2018  PCP: Biagio Borg, MD  Admit date: 03/22/2018 Discharge date: 03/23/2018  Admitted From: home  Disposition:  Home    Recommendations for Outpatient Follow-up:  1. Bmet in 1 wk 2. Consider weaning off of Xanax Discharge Condition:  stable   CODE STATUS:  Full code   Consultations:  none    Discharge Diagnoses:   Principal Problem:   Acute on chronic respiratory failure with hypercapnia (HCC) Active Problems:   Dehydration   Essential hypertension   COPD (chronic obstructive pulmonary disease) (HCC)   Cor pulmonale (HCC)   Chronic diastolic congestive heart failure (HCC)  Subjective: No trouble breathing. Eating very well (cleaned plate).   Brief Summary: Jasmine [name] is a 72 y.o. female with medical history of COPD on 3-4 L of O2, cocaine and tobacco abuse in the past, HTN, cor pulmonale. She simply states her husband sent her to the ED. In the ED, she thinks is May, 1919. She is not sure why she is in the ED.  She was not able to use her phone properly to call her husband in the room. She was thinking the EKG lead was her Pensions consultant. Upon talking to her husband on the phone, he states she was trying to change her O2 from the stationary to the portable tank but was it was not working. She was quite confused but it improved after EMS put her back on 4 L. She states she last used Cocaine in March and has not smoked in years.  He also states that she was barely eating for 2 days.   The patient is oriented today and states she felt that she was not getting enough air and was not breathing "right".   ABG in ED: pH 7.319, pCO2 102, pO2 96.1 on 4 L O2   Hospital Course:  Principal Problem:   COPD (chronic obstructive pulmonary disease) , hypercarbic resp failure - ? If she was retaining CO2 due to high O2 levels or took too much Xanax -placed on BiPAP  - repeat ABG in a few hrs:  pH7.39, PCO2 77.8 - CXR unrevealing - O2 assessment done today- she needs 2 L at rest and 4 L on exertion to maintain pulse ox 90-95% - have advised her husband to keep her Xanax and administer it himself  Active Problems:   Dehydration - very dehydrated- has received 500 cc NS in ED- hold lasix- give 500 cc more    - last ECHO shows EF 60%, grade 1 d CHF and no right heart failure-   I would decrease her to 40 mg daily on discharge - Hb appears hemoconcentrated at 14 as her baseline is 12.   H/o Cocaine use - checked UDS- only + for Benzo which is on med list  Hypothyroid - cont Synthroid  Depression/ anxiety - on Xanax 0.25 BID (checked in [facility]) and Celexa at home    Discharge Exam: Vitals:   03/23/18 0500 03/23/18 0757  BP: 109/65   Pulse: 68   Resp: 19   Temp:  98.1 F (36.7 C)  SpO2: 98%    Vitals:   03/23/18 0300 03/23/18 0400 03/23/18 0500 03/23/18 0757  BP: 112/61 109/61 109/65   Pulse: 80 78 68   Resp: 19 18 19    Temp:  98 F (36.7 C)  98.1 F (36.7 C)  TempSrc:  Axillary  Oral  SpO2: 95% 97%  98%   Weight:      Height:        General: Pt is alert, awake, not in acute distress Cardiovascular: RRR, S1/S2 +, no rubs, no gallops Respiratory: CTA bilaterally, no wheezing, no rhonchi Abdominal: Soft, NT, ND, bowel sounds + Extremities: no edema, no cyanosis   Discharge Instructions  Discharge Instructions    (HEART FAILURE PATIENTS) Call MD:  Anytime you have any of the following symptoms: 1) 3 pound weight gain in 24 hours or 5 pounds in 1 week 2) shortness of breath, with or without a dry hacking cough 3) swelling in the hands, feet or stomach 4) if you have to sleep on extra pillows at night in order to breathe.   Complete by:  As directed    Diet - low sodium heart healthy   Complete by:  As directed    Diet - low sodium heart healthy   Complete by:  As directed    Discharge instructions   Complete by:  As directed    Do not  Furosemide for 2 days and then decrease it to 40 mg once a day. Weigh yourself daily. I you gain > 3 lb in 24 hrs, you can take and extra one daily until your weight returns to normal.   Discharge instructions   Complete by:  As directed    Please only use 2 L O2 at rest and increase to 4 L when walking. Use 2 L at night as well.  Weigh yourself daily. I have cut back your Lasix to 40 mg daily as it is easy to get dehydrated in the summer and you were dehydrated.   Increase activity slowly   Complete by:  As directed    Increase activity slowly   Complete by:  As directed      Allergies as of 03/23/2018      Reactions   Alendronate Sodium Other (See Comments)   Pt does not remember this reaction      Medication List    TAKE these medications   albuterol (2.5 MG/3ML) 0.083% nebulizer solution Commonly known as:  PROVENTIL Take 3 mLs (2.5 mg total) by nebulization every 4 (four) hours as needed for wheezing.   albuterol 108 (90 Base) MCG/ACT inhaler Commonly known as:  PROAIR HFA Inhale 1-2 puffs into the lungs every 6 (six) hours as needed for wheezing or shortness of breath.   ALPRAZolam 0.25 MG tablet Commonly known as:  XANAX Take 1 tablet (0.25 mg total) by mouth 2 (two) times daily.   aspirin 81 MG EC tablet Take 1 tablet (81 mg total) by mouth daily.   citalopram 20 MG tablet Commonly known as:  CELEXA TAKE 1 TABLET BY MOUTH DAILY What changed:    how much to take  how to take this  when to take this   feeding supplement (ENSURE ENLIVE) Liqd Take 237 mLs by mouth 3 (three) times daily between meals.   fluticasone furoate-vilanterol 200-25 MCG/INH Aepb Commonly known as:  BREO ELLIPTA Inhale 1 puff into the lungs daily.   furosemide 40 MG tablet Commonly known as:  LASIX Take 1 tablet (40 mg total) by mouth daily. What changed:  when to take this   levothyroxine 75 MCG tablet Commonly known as:  SYNTHROID, LEVOTHROID TAKE 1 TABLET BY MOUTH DAILY BEFORE  BREAKFAST.   OXYGEN Inhale 4 L into the lungs continuous.   potassium chloride 10 MEQ tablet Commonly known as:  K-DUR Take 1 tablet (  10 mEq total) by mouth 2 (two) times daily.   tiotropium 18 MCG inhalation capsule Commonly known as:  SPIRIVA HANDIHALER Place 1 capsule (18 mcg total) into inhaler and inhale daily.       Allergies  Allergen Reactions  . Alendronate Sodium Other (See Comments)    Pt does not remember this reaction     Procedures/Studies:    Dg Chest 2 View  Result Date: 03/22/2018 CLINICAL DATA:  72 year old female with altered mental status, shortness of breath. EXAM: CHEST - 2 VIEW COMPARISON:  Chest radiographs 01/08/2018 and earlier. Chest CT 12/16/2017 FINDINGS: Stable lung volumes. Chronic scarring along the superior aspect of the minor fissure is unchanged. Centrilobular emphysema. No pneumothorax, pulmonary edema, pleural effusion or acute pulmonary opacity. Tortuous thoracic aorta. Other mediastinal contours are within normal limits. Visualized tracheal air column is within normal limits. No acute osseous abnormality identified. Negative visible bowel gas pattern. IMPRESSION: Emphysema (ICD10-J43.9). No acute cardiopulmonary abnormality. Electronically Signed   By: Genevie Ann M.D.   On: 03/22/2018 14:46     The results of significant diagnostics from this hospitalization (including imaging, microbiology, ancillary and laboratory) are listed below for reference.     Microbiology: Recent Results (from the past 240 hour(s))  MRSA PCR Screening     Status: None   Collection Time: 03/22/18  8:32 PM  Result Value Ref Range Status   MRSA by PCR NEGATIVE NEGATIVE Final    Comment:        The GeneXpert MRSA Assay (FDA approved for NASAL specimens only), is one component of a comprehensive MRSA colonization surveillance program. It is not intended to diagnose MRSA infection nor to guide or monitor treatment for MRSA infections. Performed at [facility], [place] [address], [place], [place] [address]      Labs: BNP (last 3 results) No results for input(s): BNP in the last 8760 hours. Basic Metabolic Panel: Recent Labs  Lab 03/22/18 1400  NA 142  K 4.3  CL 84*  CO2 39*  GLUCOSE 115*  BUN 11  CREATININE 0.62  CALCIUM 9.8   Liver Function Tests: Recent Labs  Lab 03/22/18 1400  AST 24  ALT 12*  ALKPHOS 56  BILITOT 1.2  PROT 8.1  ALBUMIN 4.4   No results for input(s): LIPASE, AMYLASE in the last 168 hours. No results for input(s): AMMONIA in the last 168 hours. CBC: Recent Labs  Lab 03/22/18 1400  WBC 4.6  HGB 14.0  HCT 48.0*  MCV 107.1*  PLT 155   Cardiac Enzymes: No results for input(s): CKTOTAL, CKMB, CKMBINDEX, TROPONINI in the last 168 hours. BNP: Invalid input(s): POCBNP CBG: No results for input(s): GLUCAP in the last 168 hours. D-Dimer No results for input(s): DDIMER in the last 72 hours. Hgb A1c No results for input(s): HGBA1C in the last 72 hours. Lipid Profile No results for input(s): CHOL, HDL, LDLCALC, TRIG, CHOLHDL, LDLDIRECT in the last 72 hours. Thyroid function studies No results for input(s): TSH, T4TOTAL, T3FREE, THYROIDAB in the last 72 hours.  Invalid input(s): FREET3 Anemia work up No results for input(s): VITAMINB12, FOLATE, FERRITIN, TIBC, IRON, RETICCTPCT in the last 72 hours. Urinalysis    Component Value Date/Time   COLORURINE AMBER (A) 03/22/2018 1506   APPEARANCEUR CLEAR 03/22/2018 1506   LABSPEC 1.025 03/22/2018 1506   PHURINE 6.0 03/22/2018 1506   GLUCOSEU NEGATIVE 03/22/2018 1506   GLUCOSEU NEGATIVE 01/11/2015 1723   HGBUR NEGATIVE 03/22/2018 [place] 03/22/2018  [place] negative 08/11/2011 1358   KETONESUR 20 (A) 03/22/2018 1506   PROTEINUR 100 (A) 03/22/2018 1506   UROBILINOGEN 0.2 01/11/2015 1723   NITRITE NEGATIVE 03/22/2018 1506   LEUKOCYTESUR MODERATE (A) 03/22/2018 1506   Sepsis Labs Invalid input(s):  PROCALCITONIN,  WBC,  LACTICIDVEN Microbiology Recent Results (from the past 240 hour(s))  MRSA PCR Screening     Status: None   Collection Time: 03/22/18  8:32 PM  Result Value Ref Range Status   MRSA by PCR NEGATIVE NEGATIVE Final    Comment:        The GeneXpert MRSA Assay (FDA approved for NASAL specimens only), is one component of a comprehensive MRSA colonization surveillance program. It is not intended to diagnose MRSA infection nor to guide or monitor treatment for MRSA infections. Performed at [facility], [place] [address], [place], [place] [address]      Time coordinating discharge in minutes: 19  SIGNED:   Debbe Odea, MD  [facility] 03/23/2018, 11:32 AM Pager   If 7PM-7AM, please contact night-coverage [email or web address] Password [email or web address]

## 2018-03-23 NOTE — Care Management Obs Status (Signed)
MEDICARE OBSERVATION STATUS NOTIFICATION   Patient Details  Name: TIARIA BIBY MRN: [id number] Date of Birth: 1946-05-26   Medicare Observation Status Notification Given:  Yes letter left in room for family    Leeroy Cha, RN 03/23/2018, 9:14 AM

## 2018-03-23 NOTE — Progress Notes (Signed)
Pt arrived to [facility] ICU/SD room 1230 via ED RN on a stretcher and BiPAP.  Pt transferred to bed with significant assistance from staff. Pt placed on telemetry, BiPAP settings maintained. Pt displays some confusion consistent with report received from ED nurse, ie answering some orientation questions appropriately followed by unrelated comments.  Pt is pleasant, easily redirected and non-impulsive.  Pt oriented to room, bed alarm on for safety, and call light within reach.  Denies pain at this time. VS stable

## 2018-03-23 NOTE — Progress Notes (Signed)
At 2140 pt set off bed alarm, attempting to get out of bed. Upon entering the room to reorient and redirect pt she became very agitated, combative and not following directions.  She had ripped off her BiPAP, refusing to put it back on while swinging fists and kicking at staff who approached.  On-call prescriber paged and orders obtained for soft wrist restraints and 1mg  Ativan IV. Pt responded well to this treatment, allowing [place] to place her back on BiPAP.  O2 sat recovered quickly and all other VS returned to WNL within minutes.  Pt currently resting in bed with wrist restraints in place for safety with eyes closed and call light within reach.

## 2018-03-24 ENCOUNTER — Encounter (HOSPITAL_COMMUNITY): Payer: Self-pay | Admitting: Emergency Medicine

## 2018-03-24 ENCOUNTER — Emergency Department (HOSPITAL_COMMUNITY): Payer: Medicare HMO

## 2018-03-24 ENCOUNTER — Inpatient Hospital Stay (HOSPITAL_COMMUNITY)
Admission: EM | Admit: 2018-03-24 | Discharge: 2018-03-26 | DRG: 189 | Disposition: A | Discharge status: Home Health | Payer: Medicare HMO | Attending: Internal Medicine | Admitting: Internal Medicine

## 2018-03-24 ENCOUNTER — Telehealth: Payer: Self-pay | Admitting: *Deleted

## 2018-03-24 DIAGNOSIS — Z79899 Other long term (current) drug therapy: Secondary | ICD-10-CM

## 2018-03-24 DIAGNOSIS — M810 Age-related osteoporosis without current pathological fracture: Secondary | ICD-10-CM | POA: Diagnosis present

## 2018-03-24 DIAGNOSIS — I2781 Cor pulmonale (chronic): Secondary | ICD-10-CM | POA: Diagnosis present

## 2018-03-24 DIAGNOSIS — J9622 Acute and chronic respiratory failure with hypercapnia: Secondary | ICD-10-CM | POA: Diagnosis present

## 2018-03-24 DIAGNOSIS — F418 Other specified anxiety disorders: Secondary | ICD-10-CM | POA: Diagnosis present

## 2018-03-24 DIAGNOSIS — F1411 Cocaine abuse, in remission: Secondary | ICD-10-CM | POA: Diagnosis present

## 2018-03-24 DIAGNOSIS — R4182 Altered mental status, unspecified: Secondary | ICD-10-CM | POA: Diagnosis present

## 2018-03-24 DIAGNOSIS — Z90721 Acquired absence of ovaries, unilateral: Secondary | ICD-10-CM | POA: Diagnosis not present

## 2018-03-24 DIAGNOSIS — E872 Acidosis: Secondary | ICD-10-CM | POA: Diagnosis present

## 2018-03-24 DIAGNOSIS — Z9981 Dependence on supplemental oxygen: Secondary | ICD-10-CM

## 2018-03-24 DIAGNOSIS — E43 Unspecified severe protein-calorie malnutrition: Secondary | ICD-10-CM | POA: Diagnosis present

## 2018-03-24 DIAGNOSIS — Z7951 Long term (current) use of inhaled steroids: Secondary | ICD-10-CM

## 2018-03-24 DIAGNOSIS — Z833 Family history of diabetes mellitus: Secondary | ICD-10-CM

## 2018-03-24 DIAGNOSIS — Z825 Family history of asthma and other chronic lower respiratory diseases: Secondary | ICD-10-CM

## 2018-03-24 DIAGNOSIS — Z85118 Personal history of other malignant neoplasm of bronchus and lung: Secondary | ICD-10-CM | POA: Diagnosis not present

## 2018-03-24 DIAGNOSIS — Z8759 Personal history of other complications of pregnancy, childbirth and the puerperium: Secondary | ICD-10-CM

## 2018-03-24 DIAGNOSIS — I10 Essential (primary) hypertension: Secondary | ICD-10-CM | POA: Diagnosis not present

## 2018-03-24 DIAGNOSIS — Z87891 Personal history of nicotine dependence: Secondary | ICD-10-CM

## 2018-03-24 DIAGNOSIS — Z801 Family history of malignant neoplasm of trachea, bronchus and lung: Secondary | ICD-10-CM

## 2018-03-24 DIAGNOSIS — J449 Chronic obstructive pulmonary disease, unspecified: Secondary | ICD-10-CM | POA: Diagnosis present

## 2018-03-24 DIAGNOSIS — Z9049 Acquired absence of other specified parts of digestive tract: Secondary | ICD-10-CM

## 2018-03-24 DIAGNOSIS — I5032 Chronic diastolic (congestive) heart failure: Secondary | ICD-10-CM | POA: Diagnosis present

## 2018-03-24 DIAGNOSIS — I110 Hypertensive heart disease with heart failure: Secondary | ICD-10-CM | POA: Diagnosis present

## 2018-03-24 DIAGNOSIS — Z6823 Body mass index (BMI) 23.0-23.9, adult: Secondary | ICD-10-CM | POA: Diagnosis not present

## 2018-03-24 DIAGNOSIS — J9692 Respiratory failure, unspecified with hypercapnia: Secondary | ICD-10-CM | POA: Diagnosis present

## 2018-03-24 DIAGNOSIS — Z823 Family history of stroke: Secondary | ICD-10-CM

## 2018-03-24 DIAGNOSIS — Z888 Allergy status to other drugs, medicaments and biological substances status: Secondary | ICD-10-CM

## 2018-03-24 DIAGNOSIS — J432 Centrilobular emphysema: Secondary | ICD-10-CM | POA: Diagnosis not present

## 2018-03-24 DIAGNOSIS — E890 Postprocedural hypothyroidism: Secondary | ICD-10-CM | POA: Diagnosis present

## 2018-03-24 DIAGNOSIS — F1910 Other psychoactive substance abuse, uncomplicated: Secondary | ICD-10-CM | POA: Diagnosis not present

## 2018-03-24 DIAGNOSIS — Z8349 Family history of other endocrine, nutritional and metabolic diseases: Secondary | ICD-10-CM

## 2018-03-24 DIAGNOSIS — G9349 Other encephalopathy: Secondary | ICD-10-CM | POA: Diagnosis present

## 2018-03-24 DIAGNOSIS — Z7982 Long term (current) use of aspirin: Secondary | ICD-10-CM

## 2018-03-24 DIAGNOSIS — Z79890 Hormone replacement therapy (postmenopausal): Secondary | ICD-10-CM

## 2018-03-24 DIAGNOSIS — Z8249 Family history of ischemic heart disease and other diseases of the circulatory system: Secondary | ICD-10-CM

## 2018-03-24 LAB — URINALYSIS, ROUTINE W REFLEX MICROSCOPIC
BILIRUBIN URINE: NEGATIVE
Glucose, UA: NEGATIVE mg/dL
Hgb urine dipstick: NEGATIVE
KETONES UR: 20 mg/dL — AB
Nitrite: NEGATIVE
Protein, ur: 30 mg/dL — AB
Specific Gravity, Urine: 1.024 (ref 1.005–1.030)
pH: 6 (ref 5.0–8.0)

## 2018-03-24 LAB — ETHANOL

## 2018-03-24 LAB — COMPREHENSIVE METABOLIC PANEL
ALBUMIN: 4.4 g/dL (ref 3.5–5.0)
ALT: 10 U/L — AB (ref 14–54)
AST: 27 U/L (ref 15–41)
Alkaline Phosphatase: 54 U/L (ref 38–126)
Anion gap: 13 (ref 5–15)
BUN: 11 mg/dL (ref 6–20)
CALCIUM: 9.7 mg/dL (ref 8.9–10.3)
CHLORIDE: 88 mmol/L — AB (ref 101–111)
CO2: 41 mmol/L — AB (ref 22–32)
Creatinine, Ser: 0.58 mg/dL (ref 0.44–1.00)
GFR calc Af Amer: >60 mL/min (ref >60)
Glucose, Bld: 105 mg/dL — ABNORMAL HIGH (ref 65–99)
POTASSIUM: 5.1 mmol/L (ref 3.5–5.1)
Sodium: 142 mmol/L (ref 135–145)
TOTAL PROTEIN: 8.2 g/dL — AB (ref 6.5–8.1)
Total Bilirubin: 1 mg/dL (ref 0.3–1.2)

## 2018-03-24 LAB — CBC WITH DIFFERENTIAL/PLATELET
Abs Immature Granulocytes: 0 10*3/uL (ref 0.0–0.1)
BASOS ABS: 0 10*3/uL (ref 0.0–0.1)
Basophils Relative: 1 %
EOS ABS: 0 10*3/uL (ref 0.0–0.7)
EOS PCT: 0 %
HEMATOCRIT: 52 % — AB (ref 36.0–46.0)
Hemoglobin: 15 g/dL (ref 12.0–15.0)
Immature Granulocytes: 0 %
Lymphocytes Relative: 22 %
Lymphs Abs: 1 10*3/uL (ref 0.7–4.0)
MCH: 30.4 pg (ref 26.0–34.0)
MCHC: 28.8 g/dL — AB (ref 30.0–36.0)
MCV: 105.3 fL — ABNORMAL HIGH (ref 78.0–100.0)
MONO ABS: 0.5 10*3/uL (ref 0.1–1.0)
Monocytes Relative: 11 %
Neutro Abs: 3 10*3/uL (ref 1.7–7.7)
Neutrophils Relative %: 66 %
Platelets: 156 10*3/uL (ref 150–400)
RBC: 4.94 MIL/uL (ref 3.87–5.11)
RDW: 13.2 % (ref 11.5–15.5)
WBC: 4.6 10*3/uL (ref 4.0–10.5)

## 2018-03-24 LAB — I-STAT CG4 LACTIC ACID, ED: Lactic Acid, Venous: 1.13 mmol/L (ref 0.5–1.9)

## 2018-03-24 LAB — BLOOD GAS, ARTERIAL
ACID-BASE EXCESS: 18.1 mmol/L — AB (ref 0.0–2.0)
Acid-Base Excess: 16.3 mmol/L — ABNORMAL HIGH (ref 0.0–2.0)
BICARBONATE: 46.6 mmol/L — AB (ref 20.0–28.0)
BICARBONATE: 44 mmol/L — AB (ref 20.0–28.0)
DELIVERY SYSTEMS: POSITIVE
DRAWN BY: 51425
Delivery systems: POSITIVE
Drawn by: 44166
Expiratory PAP: 8
Expiratory PAP: 8
FIO2: 30
FIO2: 0.5
INSPIRATORY PAP: 18
Inspiratory PAP: 15
LHR: 8 {breaths}/min
MODE: POSITIVE
O2 Saturation: 87.5 %
O2 Saturation: 98.4 %
PH ART: 7.395 (ref 7.350–7.450)
Patient temperature: 98.6
Patient temperature: 98.6
pCO2 arterial: 77.8 mmHg (ref 32.0–48.0)
pCO2 arterial: 104 mmHg (ref 32.0–48.0)
pH, Arterial: 7.251 — ABNORMAL LOW (ref 7.350–7.450)
pO2, Arterial: 51.3 mmHg — ABNORMAL LOW (ref 83.0–108.0)
pO2, Arterial: 127 mmHg — ABNORMAL HIGH (ref 83.0–108.0)

## 2018-03-24 LAB — I-STAT CHEM 8, ED
BUN: 16 mg/dL (ref 6–20)
CREATININE: 0.7 mg/dL (ref 0.44–1.00)
Calcium, Ion: 1.06 mmol/L — ABNORMAL LOW (ref 1.15–1.40)
Chloride: 90 mmol/L — ABNORMAL LOW (ref 101–111)
Glucose, Bld: 103 mg/dL — ABNORMAL HIGH (ref 65–99)
HCT: 52 % — ABNORMAL HIGH (ref 36.0–46.0)
HEMOGLOBIN: 17.7 g/dL — AB (ref 12.0–15.0)
POTASSIUM: 4.9 mmol/L (ref 3.5–5.1)
SODIUM: 139 mmol/L (ref 135–145)
TCO2: 45 mmol/L — ABNORMAL HIGH (ref 22–32)

## 2018-03-24 LAB — RAPID URINE DRUG SCREEN, HOSP PERFORMED
AMPHETAMINES: NOT DETECTED
BARBITURATES: NOT DETECTED
Benzodiazepines: POSITIVE — AB
Cocaine: NOT DETECTED
Opiates: NOT DETECTED
TETRAHYDROCANNABINOL: NOT DETECTED

## 2018-03-24 LAB — CBG MONITORING, ED: GLUCOSE-CAPILLARY: 107 mg/dL — AB (ref 65–99)

## 2018-03-24 LAB — TROPONIN I

## 2018-03-24 LAB — AMMONIA: Ammonia: 56 umol/L — ABNORMAL HIGH (ref 9–35)

## 2018-03-24 MED ORDER — ACETAMINOPHEN 325 MG PO TABS: 650.0000 mg | ORAL_TABLET | Freq: Four times a day (QID) | ORAL | Status: DC | PRN

## 2018-03-24 MED ORDER — SODIUM CHLORIDE 0.9 % IV SOLN
INTRAVENOUS | Status: AC
  Administered 2018-03-25: 01:00:00 via INTRAVENOUS

## 2018-03-24 MED ORDER — SODIUM CHLORIDE 0.9 % IV BOLUS
1000.0000 mL | Freq: Once | INTRAVENOUS | Status: AC
  Administered 2018-03-24: 1000 mL via INTRAVENOUS

## 2018-03-24 MED ORDER — ONDANSETRON HCL 4 MG/2ML IJ SOLN: 4.0000 mg | Freq: Four times a day (QID) | INTRAMUSCULAR | Status: DC | PRN

## 2018-03-24 MED ORDER — ALBUTEROL SULFATE (2.5 MG/3ML) 0.083% IN NEBU
2.5000 mg | INHALATION_SOLUTION | RESPIRATORY_TRACT | Status: DC | PRN
  Administered 2018-03-25: 2.5 mg via RESPIRATORY_TRACT
  Filled 2018-03-24: qty 3

## 2018-03-24 MED ORDER — LEVOTHYROXINE SODIUM 75 MCG PO TABS
75.0000 ug | ORAL_TABLET | Freq: Every day | ORAL | Status: DC
  Administered 2018-03-25 – 2018-03-26 (×2): 75 ug via ORAL
  Filled 2018-03-24 (×2): qty 1

## 2018-03-24 MED ORDER — ONDANSETRON HCL 4 MG PO TABS: 4.0000 mg | ORAL_TABLET | Freq: Four times a day (QID) | ORAL | Status: DC | PRN

## 2018-03-24 MED ORDER — ACETAMINOPHEN 650 MG RE SUPP: 650.0000 mg | Freq: Four times a day (QID) | RECTAL | Status: DC | PRN

## 2018-03-24 MED ORDER — ENOXAPARIN SODIUM 30 MG/0.3ML ~~LOC~~ SOLN
30.0000 mg | SUBCUTANEOUS | Status: DC
  Administered 2018-03-25 – 2018-03-26 (×2): 30 mg via SUBCUTANEOUS
  Filled 2018-03-24 (×2): qty 0.3

## 2018-03-24 MED ORDER — ASPIRIN EC 81 MG PO TBEC
81.0000 mg | DELAYED_RELEASE_TABLET | Freq: Every day | ORAL | Status: DC
  Administered 2018-03-25 – 2018-03-26 (×2): 81 mg via ORAL
  Filled 2018-03-24 (×2): qty 1

## 2018-03-24 MED ORDER — ALPRAZOLAM 0.25 MG PO TABS: 0.2500 mg | ORAL_TABLET | Freq: Two times a day (BID) | ORAL | Status: DC | PRN

## 2018-03-24 MED ORDER — IPRATROPIUM BROMIDE 0.02 % IN SOLN
0.5000 mg | Freq: Four times a day (QID) | RESPIRATORY_TRACT | Status: DC
  Administered 2018-03-25 (×3): 0.5 mg via RESPIRATORY_TRACT
  Filled 2018-03-24 (×3): qty 2.5

## 2018-03-24 NOTE — ED Notes (Signed)
IV team at bedside 

## 2018-03-24 NOTE — ED Provider Notes (Signed)
[facility] EMERGENCY DEPARTMENT Provider Note   CSN: [id number] Arrival date & time: 03/24/18  1430     History   Chief Complaint Chief Complaint  Patient presents with  . Shortness of Breath    HPI Jasmine [name] is a 72 y.o. female.  72 yo F with a chief complaint of altered mental status.  Patient came into the hospital with this 2 days ago.  Was diagnosed is being hypercarbic and was discharged yesterday.  The family feels that she never made it back to baseline.  Prior to her hospitalization she was able to ambulate and converse and take care of herself.  Since her hospitalization she has been unable to walk she has been persistently confused and is not taking care of herself at home.  They deny any infectious symptoms.  Level 5 caveat altered mental status.  When asked the family if there are any new medication changes they felt that her Xanax was increased.  The history is provided by the patient and a relative.  Shortness of Breath  Pertinent negatives include no fever, no headaches, no rhinorrhea, no wheezing, no chest pain and no vomiting.  Illness  This is a new problem. The current episode started yesterday. The problem occurs constantly. The problem has not changed since onset.Pertinent negatives include no chest pain, no headaches and no shortness of breath. Nothing aggravates the symptoms. Nothing relieves the symptoms. She has tried nothing for the symptoms. The treatment provided no relief.    Past Medical History:  Diagnosis Date  . ALLERGIC RHINITIS 05/01/2007  . Anxiety   . Arthritis    "hands" (12/28/2015)  . ASTHMA 05/01/2007   "since I was a child"  . Cancer (HCC)    LUNG  . CHF (congestive heart failure) ([place])   . Chronic bronchitis ([place])   . COPD (chronic obstructive pulmonary disease) ([place])   . DEPRESSION 07/21/2008  . DVT (deep venous thrombosis) (HCC) 1960s   LLE  . FATIGUE 10/10/2010  . HYPERTENSION 05/01/2007  . HYPERTHYROIDISM  11/23/2007   Pt endorses having had Graves disease, possibly radioactive iodine x1, but no thyroidectomy  . HYPOTHYROIDISM 08/23/2009  . LUMBAR RADICULOPATHY, RIGHT 08/25/2008  . On home oxygen therapy    "3-4L; qd; all the time" (12/28/2015)  . OSTEOPOROSIS 05/01/2007  . SHOULDER PAIN, LEFT 08/23/2009  . SINUSITIS, CHRONIC 10/10/2010    Patient Active Problem List   Diagnosis Date Noted  . Acute on chronic respiratory failure with hypercapnia ([place]) 03/23/2018  . Hypercarbia 03/22/2018  . Dehydration 03/22/2018  . Cerumen impaction 02/18/2018  . Polysubstance abuse ([place]) 01/26/2018  . Acute on chronic respiratory failure ([place]) 01/07/2018  . Aspiration into airway 06/03/2017  . Insect bite 07/12/2016  . Rash 07/12/2016  . Bilateral hearing loss 06/05/2016  . Hyperglycemia 05/21/2016  . Abnormal LFTs 03/24/2016  . Elevated blood sugar 03/24/2016  . Solitary pulmonary nodule 01/09/2016  . Chronic respiratory failure with hypoxia ([place])   . On home oxygen therapy   . Chronic diastolic congestive heart failure ([place])   . Staphylococcus aureus bacteremia 12/28/2015  . Bacteremia Step viridans 12/27/2015  . Tobacco abuse disorder 12/25/2015  . Hypothyroidism following radioiodine therapy 09/26/2014  . Protein-calorie malnutrition, severe ([place]) 06/23/2014  . Cor pulmonale ([place]) 01/12/2013  . COPD (chronic obstructive pulmonary disease) ([place]) 11/29/2011  . Macrocytosis without anemia 11/09/2011  . SINUSITIS, CHRONIC 10/10/2010  . FATIGUE 10/10/2010  . Depression with anxiety 07/21/2008  . Essential hypertension 05/01/2007  .  ALLERGIC RHINITIS 05/01/2007  . ASTHMA 05/01/2007  . OSTEOPOROSIS 05/01/2007    Past Surgical History:  Procedure Laterality Date  . APPENDECTOMY    . DILATION AND CURETTAGE OF UTERUS  multiple   history of multiple dialations and curettages and miscarriages, unfortunately never carrying a child to term  . ECTOPIC PREGNANCY SURGERY  "late '60s or early '70s  .  ELECTROCARDIOGRAM  06/20/2006  . OOPHORECTOMY Right      OB History    Gravida  5   Para      Term      Preterm      AB  5   Living        SAB  4   TAB      Ectopic  1   Multiple      Live Births               Home Medications    Prior to Admission medications   Medication Sig Start Date End Date Taking? Authorizing Provider  albuterol (PROAIR HFA) 108 (90 Base) MCG/ACT inhaler Inhale 1-2 puffs into the lungs every 6 (six) hours as needed for wheezing or shortness of breath. 12/17/17  Yes Rigoberto Noel, MD  ALPRAZolam Duanne Moron) 0.25 MG tablet Take 1 tablet (0.25 mg total) by mouth 2 (two) times daily. 10/22/17  Yes Biagio Borg, MD  aspirin EC 81 MG EC tablet Take 1 tablet (81 mg total) by mouth daily. 03/27/16  Yes Velvet Bathe, MD  citalopram (CELEXA) 20 MG tablet TAKE 1 TABLET BY MOUTH DAILY Patient taking differently: Take 20 mg by mouth once a day 10/21/17  Yes Biagio Borg, MD  fluticasone furoate-vilanterol (BREO ELLIPTA) 200-25 MCG/INH AEPB Inhale 1 puff into the lungs daily. 12/17/17  Yes Rigoberto Noel, MD  furosemide (LASIX) 40 MG tablet Take 1 tablet (40 mg total) by mouth daily. Patient taking differently: Take 40 mg by mouth 2 (two) times daily.  03/23/18 03/23/19 Yes Rizwan, Eunice Blase, MD  levothyroxine (SYNTHROID, LEVOTHROID) 75 MCG tablet TAKE 1 TABLET BY MOUTH DAILY BEFORE BREAKFAST. 03/19/18  Yes Biagio Borg, MD  OXYGEN Inhale 4 L into the lungs continuous.    Yes [provider]  potassium chloride (K-DUR) 10 MEQ tablet Take 1 tablet (10 mEq total) by mouth 2 (two) times daily. 06/03/17  Yes Biagio Borg, MD  tiotropium (SPIRIVA HANDIHALER) 18 MCG inhalation capsule Place 1 capsule (18 mcg total) into inhaler and inhale daily. 12/17/17  Yes Rigoberto Noel, MD  albuterol (PROVENTIL) (2.5 MG/3ML) 0.083% nebulizer solution Take 3 mLs (2.5 mg total) by nebulization every 4 (four) hours as needed for wheezing. Patient not taking: Reported on 03/24/2018 06/03/17    Biagio Borg, MD  feeding supplement, ENSURE ENLIVE, (ENSURE ENLIVE) LIQD Take 237 mLs by mouth 3 (three) times daily between meals. 12/31/15   Florencia Reasons, MD    Family History Family History  Problem Relation Age of Onset  . Lung cancer Father   . Alcohol abuse Brother   . Diabetes Brother   . Hypertension Other   . Stroke Mother   . Thyroid disease Mother   . Asthma Unknown        maternal aunts    Social History Social History   Tobacco Use  . Smoking status: Former Smoker    Packs/day: 1.00    Years: 54.00    Pack years: 54.00    Types: Cigarettes    Last attempt to  quit: 07/21/2015    Years since quitting: 2.6  . Smokeless tobacco: Never Used  Substance Use Topics  . Alcohol use: Yes    Comment: 1-4 beers per month   . Drug use: Yes    Frequency: 2.0 times per week    Types: "Crack" cocaine    Comment: 12/28/2015 "sometimes weekends and holidays"     Allergies   Alendronate sodium   Review of Systems Review of Systems  Unable to perform ROS: Mental status change  Constitutional: Negative for chills and fever.  HENT: Negative for congestion and rhinorrhea.   Eyes: Negative for redness and visual disturbance.  Respiratory: Negative for shortness of breath and wheezing.   Cardiovascular: Negative for chest pain and palpitations.  Gastrointestinal: Negative for nausea and vomiting.  Genitourinary: Negative for dysuria and urgency.  Musculoskeletal: Negative for arthralgias and myalgias.  Skin: Negative for pallor and wound.  Neurological: Negative for dizziness and headaches.     Physical Exam Updated Vital Signs BP 98/80   Pulse 85   Temp 98.6 F (37 C) (Rectal)   Resp 18   SpO2 100%   Physical Exam  Constitutional: She appears well-developed and well-nourished. No distress.  HENT:  Head: Normocephalic and atraumatic.  Eyes: Pupils are equal, round, and reactive to light. EOM are normal.  Neck: Normal range of motion. Neck supple.    Cardiovascular: Normal rate and regular rhythm. Exam reveals no gallop and no friction rub.  No murmur heard. Pulmonary/Chest: Effort normal. She has no wheezes. She has no rales.  Abdominal: Soft. She exhibits no distension. There is no tenderness.  Musculoskeletal: She exhibits no edema or tenderness.  Neurological: She is alert.  Sleepy on exam, confused to scenario.  Skin: Skin is warm and dry. She is not diaphoretic.  Nursing note and vitals reviewed.    ED Treatments / Results  Labs (all labs ordered are listed, but only abnormal results are displayed) Labs Reviewed  AMMONIA - Abnormal; Notable for the following components:      Result Value   Ammonia 56 (*)    All other components within normal limits  COMPREHENSIVE METABOLIC PANEL - Abnormal; Notable for the following components:   Chloride 88 (*)    CO2 41 (*)    Glucose, Bld 105 (*)    Total Protein 8.2 (*)    ALT 10 (*)    All other components within normal limits  CBC WITH DIFFERENTIAL/PLATELET - Abnormal; Notable for the following components:   HCT 52.0 (*)    MCV 105.3 (*)    MCHC 28.8 (*)    All other components within normal limits  URINALYSIS, ROUTINE W REFLEX MICROSCOPIC - Abnormal; Notable for the following components:   APPearance HAZY (*)    Ketones, ur 20 (*)    Protein, ur 30 (*)    Leukocytes, UA MODERATE (*)    WBC, UA >50 (*)    Bacteria, UA RARE (*)    Non Squamous Epithelial 0-5 (*)    All other components within normal limits  RAPID URINE DRUG SCREEN, HOSP PERFORMED - Abnormal; Notable for the following components:   Benzodiazepines POSITIVE (*)    All other components within normal limits  CBG MONITORING, ED - Abnormal; Notable for the following components:   Glucose-Capillary 107 (*)    All other components within normal limits  I-STAT CHEM 8, ED - Abnormal; Notable for the following components:   Chloride 90 (*)    Glucose, Bld 103 (*)  Calcium, Ion 1.06 (*)    TCO2 45 (*)     Hemoglobin 17.7 (*)    HCT 52.0 (*)    All other components within normal limits  URINE CULTURE  ETHANOL  BLOOD GAS, ARTERIAL  TROPONIN I  TROPONIN I  TROPONIN I  I-STAT CG4 LACTIC ACID, ED  I-STAT VENOUS BLOOD GAS, ED  I-STAT VENOUS BLOOD GAS, ED    EKG EKG Interpretation  Date/Time:  Wednesday March 24 2018 14:47:12 EDT Ventricular Rate:  85 PR Interval:    QRS Duration: 77 QT Interval:  360 QTC Calculation: 428 R Axis:   92 Text Interpretation:  Sinus rhythm Atrial premature complex Biatrial enlargement Anterior infarct, old Nonspecific T abnormalities, lateral leads No significant change since last tracing Confirmed by Deno Etienne [phone]) on 03/24/2018 3:06:07 PM   Radiology Dg Chest 2 View  Result Date: 03/24/2018 CLINICAL DATA:  Shortness of breath, COPD, lung cancer EXAM: CHEST - 2 VIEW COMPARISON:  Chest radiographs dated 03/22/2018. CT chest dated 12/16/2017. FINDINGS: Platelike opacity in the right upper lobe, corresponding to radiation changes on prior CT. No focal consolidation.  No pleural effusion or pneumothorax. The heart is normal in size. Visualized osseous structures are within normal limits. IMPRESSION: No evidence of acute cardiopulmonary disease. Electronically Signed   By: Julian Hy M.D.   On: 03/24/2018 16:55   Ct Head Wo Contrast  Result Date: 03/24/2018 CLINICAL DATA:  Altered level of consciousness EXAM: CT HEAD WITHOUT CONTRAST TECHNIQUE: Contiguous axial images were obtained from the base of the skull through the vertex without intravenous contrast. COMPARISON:  01/07/2018 FINDINGS: Brain: No acute intracranial abnormality. Specifically, no hemorrhage, hydrocephalus, mass lesion, acute infarction, or significant intracranial injury. Vascular: No hyperdense vessel or unexpected calcification. Skull: No acute calvarial abnormality. Sinuses/Orbits: Visualized paranasal sinuses and mastoids clear. Orbital soft tissues unremarkable. Other: None IMPRESSION:  No acute intracranial abnormality. Electronically Signed   By: Rolm Baptise M.D.   On: 03/24/2018 19:13    Procedures Procedures (including critical care time)  Medications Ordered in ED Medications  sodium chloride 0.9 % bolus 1,000 mL (0 mLs Intravenous Stopped 03/24/18 1625)     Initial Impression / Assessment and Plan / ED Course  I have reviewed the triage vital signs and the nursing notes.  Pertinent labs & imaging results that were available during my care of the patient were reviewed by me and considered in my medical decision making (see chart for details).     72 yo F with a chief complaint of altered mental status.  Will obtain an altered mental status work-up.  Give the patient a liter of fluids.  Reassess.  Found to be hypercarbic.  CO2 on measurably high.  Placed on BiPAP with significant improvement of her mental status.  Rest of the mental status work-up is unremarkable.  CT the head without concerning finding is viewed by me.  Chest x-ray without focal infiltrate or edema is viewed by me.  Hemoglobin is stable, electrolytes at baseline.  Discussed with the hospitalist will admit patient to the hospital.  CRITICAL CARE Performed by: Cecilio Asper   Total critical care time: 80 minutes  Critical care time was exclusive of separately billable procedures and treating other patients.  Critical care was necessary to treat or prevent imminent or life-threatening deterioration.  Critical care was time spent personally by me on the following activities: development of treatment plan with patient and/or surrogate as well as nursing, discussions with consultants, evaluation of  patient's response to treatment, examination of patient, obtaining history from patient or surrogate, ordering and performing treatments and interventions, ordering and review of laboratory studies, ordering and review of radiographic studies, pulse oximetry and re-evaluation of patient's  condition.  The patients results and plan were reviewed and discussed.   Any x-rays performed were independently reviewed by myself.   Differential diagnosis were considered with the presenting HPI.  Medications  sodium chloride 0.9 % bolus 1,000 mL (0 mLs Intravenous Stopped 03/24/18 1625)    Vitals:   03/24/18 1845 03/24/18 1926 03/24/18 1930 03/24/18 1958  BP: 129/69  129/67 98/80  Pulse: 82  80 85  Resp: 20  16 18   Temp:  98.6 F (37 C)    TempSrc:  Rectal    SpO2: 100%  100% 100%    Final diagnoses:  Acute on chronic respiratory failure with hypercapnia (HCC)    Admission/ observation were discussed with the admitting physician, patient and/or family and they are comfortable with the plan.   Final Clinical Impressions(s) / ED Diagnoses   Final diagnoses:  Acute on chronic respiratory failure with hypercapnia [facility])    ED Discharge Orders    None       Deno Etienne, DO 03/24/18 2029

## 2018-03-24 NOTE — Progress Notes (Signed)
Pt transferred to 2W8 w/o complications. Report given, Unit RRT aware

## 2018-03-24 NOTE — ED Notes (Signed)
respiratory at bedside.

## 2018-03-24 NOTE — Progress Notes (Signed)
Patient transported to CT and back to room C24 without any apparent complications.

## 2018-03-24 NOTE — ED Notes (Signed)
Please call pt. Niece, Willene Hatchet, if any information is needed. [phone]

## 2018-03-24 NOTE — Telephone Encounter (Signed)
Pt was on TCM report D/C 03/23/18 tried calling pt no answer LMOM RTC to make hosp f/u appt.Marland KitchenJohny Chess

## 2018-03-24 NOTE — ED Notes (Signed)
Lab in room for blood draw

## 2018-03-24 NOTE — ED Notes (Signed)
Bladder scan showed 250mL urine in bladder

## 2018-03-24 NOTE — ED Notes (Signed)
Patient transported to CT with respiratory, and this RN

## 2018-03-24 NOTE — ED Notes (Signed)
pts Ex-husband, who pt resides with, [name] needs to be called for any updates/information needed.  [phone]

## 2018-03-24 NOTE — ED Notes (Signed)
Patient transported to X-ray 

## 2018-03-24 NOTE — ED Triage Notes (Signed)
Pt arrives from home by [facility] for confusion and sob that hasn't improved since being discharged from [facility] yesterday. Pt wears 4L [place] at home and is 87-90% on arrival. Pt has history of COPD as well as lung CA. Pt is alert and oriented to person place and time.

## 2018-03-24 NOTE — Progress Notes (Signed)
Pt is a chronic CO2 retainer due to her significant lung disease. MD made aware of her poor minute ventilation even on a trial on AVAPS mode on the NIV machine. Pt switched back to IPAP/EPAP settings per MD.

## 2018-03-24 NOTE — H&P (Signed)
VIANNA VENEZIA [name]:[id number] DOB: 1946-06-21 DOA: 03/24/2018     PCP: Biagio Borg, MD   Outpatient Specialists:      Pulmonary  Dr. Elsworth Soho  Oncology  Dr. Julien Nordmann Patient arrived to ER on 03/24/18 at 1430  Patient coming from:  home Lives   With family    Chief Complaint:  Chief Complaint  Patient presents with  . Shortness of Breath    HPI: Jasmine [name] is a 72 y.o. female with medical history significant of HTN, COPD on 3-4 L of O2, cor pulmonale, chronic diastolic CHF,  chronic respiratory failure with hypercapnia cocaine and tobacco abuse in the past,  HTN    Presented with  Altered mental status Admission was on 3 June for similar presentation secondary to confusion due to hypercarbic respiratory failure with PCO2 initially 102 requiring BiPAP Patient was discharged just yesterday to home with recommendations to wean off of Xanax.  Patient became more somnolent and confused shortly there after.  Family states that she has been significantly confused after her discharge she is on 4 L nasal cannula at home on EMS arrival satting 87 to 90%.  Family states since her discharge patient has not been able to walk and has been persistently confused and unable to take care of herself.  This is not her baseline. Since her discharge she have not had any fevers chills no chest pain or shortness of breath family was under the impression of her Xanax was supposed to be increased but unsure if any additional doses were given to her. Prior to this patient has been admitted in March with COPD exacerbation and volume overload in the setting of cocaine abuse requiring BiPAP as well She reports drinking some beer or wine occasionally  Regarding pertinent Chronic problems:   Recent echogram showing grade 1 diastolic dysfunction but preserved EF History of hypothyroidism on Synthroid History of lung cancer??  Last time has been seen by Dr. Julien Nordmann in 2017 was told there is no evidence of  malignancy at that time History of cocaine abuse last time was in March While in ER:   Following Medications were ordered in ER: Medications  sodium chloride 0.9 % bolus 1,000 mL (0 mLs Intravenous Stopped 03/24/18 1625)    Significant initial  Findings: Abnormal Labs Reviewed  AMMONIA - Abnormal; Notable for the following components:      Result Value   Ammonia 56 (*)    All other components within normal limits  COMPREHENSIVE METABOLIC PANEL - Abnormal; Notable for the following components:   Chloride 88 (*)    CO2 41 (*)    Glucose, Bld 105 (*)    Total Protein 8.2 (*)    ALT 10 (*)    All other components within normal limits  CBC WITH DIFFERENTIAL/PLATELET - Abnormal; Notable for the following components:   HCT 52.0 (*)    MCV 105.3 (*)    MCHC 28.8 (*)    All other components within normal limits  CBG MONITORING, ED - Abnormal; Notable for the following components:   Glucose-Capillary 107 (*)    All other components within normal limits  I-STAT CHEM 8, ED - Abnormal; Notable for the following components:   Chloride 90 (*)    Glucose, Bld 103 (*)    Calcium, Ion 1.06 (*)    TCO2 45 (*)    Hemoglobin 17.7 (*)    HCT 52.0 (*)    All other components within normal limits  Na 142 K 5.1  Cr  Stable,  Lab Results  Component Value Date   CREATININE 0.70 03/24/2018   CREATININE 0.58 03/24/2018   CREATININE 0.62 03/22/2018      WBC  4.6  HG/HCT  up    Component Value Date/Time   HGB 17.7 (H) 03/24/2018 1620   HGB 14.1 08/06/2016 1404   HCT 52.0 (H) 03/24/2018 1620   HCT 44.6 08/06/2016 1404       Troponin (Point of Care Test) Recent Labs    03/22/18 1433  TROPIPOC 0.02       Lactic Acid, Venous    Component Value Date/Time   LATICACIDVEN 1.13 03/24/2018 1621      UA ordered   CXR -  NON acute     ECG:  Personally reviewed by me showing: HR : 85 Rhythm:  NSR,  bi atrial enlargement   no evidence of ischemic changes QTC   428       ED Triage Vitals  Enc Vitals Group     BP 03/24/18 1437 122/63     Pulse Rate 03/24/18 1437 92     Resp 03/24/18 1437 16     Temp --      Temp src --      SpO2 03/24/18 1437 90 %     Weight --      Height --      Head Circumference --      Peak Flow --      Pain Score 03/24/18 1439 0     Pain Loc --      Pain Edu? --      Excl. in GC? --   TMAX(24)@      Ammonia 56   Latest  Blood pressure 114/70, pulse 86, resp. rate 19, SpO2 100 %.     Hospitalist was called for admission for acute on chronic respiratory failure with hypercarbia  Review of Systems:    Pertinent positives include: confusion, fatigue,   Constitutional:  No weight loss, night sweats, Fevers, chills, weight loss  HEENT:  No headaches, Difficulty swallowing,Tooth/dental problems,Sore throat,  No sneezing, itching, ear ache, nasal congestion, post nasal drip,  Cardio-vascular:  No chest pain, Orthopnea, PND, anasarca, dizziness, palpitations.no Bilateral lower extremity swelling  GI:  No heartburn, indigestion, abdominal pain, nausea, vomiting, diarrhea, change in bowel habits, loss of appetite, melena, blood in stool, hematemesis Resp:  no shortness of breath at rest. No dyspnea on exertion, No excess mucus, no productive cough, No non-productive cough, No coughing up of blood.No change in color of mucus.No wheezing. Skin:  no rash or lesions. No jaundice GU:  no dysuria, change in color of urine, no urgency or frequency. No straining to urinate.  No flank pain.  Musculoskeletal:  No joint pain or no joint swelling. No decreased range of motion. No back pain.  Psych:  No change in mood or affect. No depression or anxiety. No memory loss.  Neuro: no localizing neurological complaints, no tingling, no weakness, no double vision, no gait abnormality, no slurred speech, no   As per HPI otherwise 10 point review of systems negative.   Past Medical History:   Past Medical History:   Diagnosis Date  . ALLERGIC RHINITIS 05/01/2007  . Anxiety   . Arthritis    "hands" (12/28/2015)  . ASTHMA 05/01/2007   "since I was a child"  . Cancer (HCC)    LUNG  . CHF (congestive heart failure) ([place])   . Chronic  bronchitis ([place])   . COPD (chronic obstructive pulmonary disease) ([place])   . DEPRESSION 07/21/2008  . DVT (deep venous thrombosis) (HCC) 1960s   LLE  . FATIGUE 10/10/2010  . HYPERTENSION 05/01/2007  . HYPERTHYROIDISM 11/23/2007   Pt endorses having had Graves disease, possibly radioactive iodine x1, but no thyroidectomy  . HYPOTHYROIDISM 08/23/2009  . LUMBAR RADICULOPATHY, RIGHT 08/25/2008  . On home oxygen therapy    "3-4L; qd; all the time" (12/28/2015)  . OSTEOPOROSIS 05/01/2007  . SHOULDER PAIN, LEFT 08/23/2009  . SINUSITIS, CHRONIC 10/10/2010      Past Surgical History:  Procedure Laterality Date  . APPENDECTOMY    . DILATION AND CURETTAGE OF UTERUS  multiple   history of multiple dialations and curettages and miscarriages, unfortunately never carrying a child to term  . ECTOPIC PREGNANCY SURGERY  "late '60s or early '70s  . ELECTROCARDIOGRAM  06/20/2006  . OOPHORECTOMY Right     Social History:  Ambulatory  independently        reports that she quit smoking about 2 years ago. Her smoking use included cigarettes. She has a 54.00 pack-year smoking history. She has never used smokeless tobacco. She reports that she drinks alcohol. She reports that she has current or past drug history. Drug: "Crack" cocaine. Frequency: 2.00 times per week.     Family History:  Family History  Problem Relation Age of Onset  . Lung cancer Father   . Alcohol abuse Brother   . Diabetes Brother   . Hypertension Other   . Stroke Mother   . Thyroid disease Mother   . Asthma Unknown        maternal aunts    Allergies: Allergies  Allergen Reactions  . Alendronate Sodium Other (See Comments)    Pt does not remember this reaction     Prior to Admission medications    Medication Sig Start Date End Date Taking? Authorizing Provider  albuterol (PROAIR HFA) 108 (90 Base) MCG/ACT inhaler Inhale 1-2 puffs into the lungs every 6 (six) hours as needed for wheezing or shortness of breath. 12/17/17   Rigoberto Noel, MD  albuterol (PROVENTIL) (2.5 MG/3ML) 0.083% nebulizer solution Take 3 mLs (2.5 mg total) by nebulization every 4 (four) hours as needed for wheezing. 06/03/17   Biagio Borg, MD  ALPRAZolam Duanne Moron) 0.25 MG tablet Take 1 tablet (0.25 mg total) by mouth 2 (two) times daily. 10/22/17   Biagio Borg, MD  aspirin EC 81 MG EC tablet Take 1 tablet (81 mg total) by mouth daily. 03/27/16   Velvet Bathe, MD  citalopram (CELEXA) 20 MG tablet TAKE 1 TABLET BY MOUTH DAILY Patient taking differently: TAKE 1 TABLET (20 MG)  BY MOUTH DAILY 10/21/17   Biagio Borg, MD  feeding supplement, ENSURE ENLIVE, (ENSURE ENLIVE) LIQD Take 237 mLs by mouth 3 (three) times daily between meals. 12/31/15   Florencia Reasons, MD  fluticasone furoate-vilanterol (BREO ELLIPTA) 200-25 MCG/INH AEPB Inhale 1 puff into the lungs daily. 12/17/17   Rigoberto Noel, MD  furosemide (LASIX) 40 MG tablet Take 1 tablet (40 mg total) by mouth daily. 03/23/18 03/23/19  Debbe Odea, MD  levothyroxine (SYNTHROID, LEVOTHROID) 75 MCG tablet TAKE 1 TABLET BY MOUTH DAILY BEFORE BREAKFAST. 03/19/18   Biagio Borg, MD  OXYGEN Inhale 4 L into the lungs continuous.     [provider]  potassium chloride (K-DUR) 10 MEQ tablet Take 1 tablet (10 mEq total) by mouth 2 (two) times daily. 06/03/17  Biagio Borg, MD  tiotropium (SPIRIVA HANDIHALER) 18 MCG inhalation capsule Place 1 capsule (18 mcg total) into inhaler and inhale daily. 12/17/17   Rigoberto Noel, MD   Physical Exam: Blood pressure 114/70, pulse 86, resp. rate 19, SpO2 100 %. 1. General:  in No Acute distress  Chronically ill -appearing 2. Psychological: Alert and  Oriented 3. Head/ENT:     Dry Mucous Membranes                          Head Non traumatic,  neck supple                           Poor Dentition 4. SKIN:   decreased Skin turgor,  Skin clean Dry and intact no rash 5. Heart: Regular rate and rhythm no  Murmur, no Rub or gallop 6. Lungs:  no wheezes or crackles   7. Abdomen: Soft, non-tender, Non distended  obese    bowel sounds present 8. Lower extremities: no clubbing, cyanosis, or edema 9. Neurologically Grossly intact, moving all 4 extremities equally   10. MSK: Normal range of motion   LABS:     Recent Labs  Lab 03/22/18 1400 03/24/18 1612 03/24/18 1620  WBC 4.6 4.6  --   NEUTROABS  --  3.0  --   HGB 14.0 15.0 17.7*  HCT 48.0* 52.0* 52.0*  MCV 107.1* 105.3*  --   PLT 155 156  --    Basic Metabolic Panel: Recent Labs  Lab 03/22/18 1400 03/24/18 1612 03/24/18 1620  NA 142 142 139  K 4.3 5.1 4.9  CL 84* 88* 90*  CO2 39* 41*  --   GLUCOSE 115* 105* 103*  BUN 11 11 16   CREATININE 0.62 0.58 0.70  CALCIUM 9.8 9.7  --       Recent Labs  Lab 03/22/18 1400 03/24/18 1612  AST 24 27  ALT 12* 10*  ALKPHOS 56 54  BILITOT 1.2 1.0  PROT 8.1 8.2*  ALBUMIN 4.4 4.4   No results for input(s): LIPASE, AMYLASE in the last 168 hours. Recent Labs  Lab 03/24/18 1612  AMMONIA 56*      HbA1C: No results for input(s): HGBA1C in the last 72 hours. CBG: Recent Labs  Lab 03/24/18 1618  GLUCAP 107*      Urine analysis:    Component Value Date/Time   COLORURINE AMBER (A) 03/22/2018 1506   APPEARANCEUR CLEAR 03/22/2018 1506   LABSPEC 1.025 03/22/2018 1506   PHURINE 6.0 03/22/2018 1506   GLUCOSEU NEGATIVE 03/22/2018 1506   GLUCOSEU NEGATIVE 01/11/2015 1723   HGBUR NEGATIVE 03/22/2018 1506   BILIRUBINUR NEGATIVE 03/22/2018 1506   BILIRUBINUR negative 08/11/2011 1358   KETONESUR 20 (A) 03/22/2018 1506   PROTEINUR 100 (A) 03/22/2018 1506   UROBILINOGEN 0.2 01/11/2015 1723   NITRITE NEGATIVE 03/22/2018 1506   LEUKOCYTESUR MODERATE (A) 03/22/2018 1506      Cultures:    Component Value Date/Time    SDES SPUTUM 05/21/2016 2000   SDES SPUTUM 05/21/2016 2000   [place] NONE 05/21/2016 2000   [place] NONE 05/21/2016 2000   CULT  05/21/2016 2000    Consistent with normal respiratory flora. Performed at [facility] 05/21/2016 FINAL 05/21/2016 2000   REPTSTATUS 05/24/2016 FINAL 05/21/2016 2000     Radiological Exams on Admission: Dg Chest 2 View  Result Date: 03/24/2018 CLINICAL DATA:  Shortness of  breath, COPD, lung cancer EXAM: CHEST - 2 VIEW COMPARISON:  Chest radiographs dated 03/22/2018. CT chest dated 12/16/2017. FINDINGS: Platelike opacity in the right upper lobe, corresponding to radiation changes on prior CT. No focal consolidation.  No pleural effusion or pneumothorax. The heart is normal in size. Visualized osseous structures are within normal limits. IMPRESSION: No evidence of acute cardiopulmonary disease. Electronically Signed   By: Julian Hy M.D.   On: 03/24/2018 16:55    Chart has been reviewed    Assessment/Plan  72 y.o. female with medical history significant of HTN, COPD on 3-4 L of O2, cor pulmonale, chronic diastolic CHF,  chronic respiratory failure with hypercapnia cocaine and tobacco abuse in the past,  HTN Admitted for acute on chronic respiratory failure with hypercarbia    Present on Admission: . Acute on chronic respiratory failure with hypercapnia (HCC) On BIPAP admit to stepdown. Possibly multifactorial.   . Chronic diastolic congestive heart failure ([place]) - - currently appears to be slightly on the dry side, hold home diuretics for tonight and restart when appears euvolemic, carefuly follow fluid status and Cr  . COPD (chronic obstructive pulmonary disease) (HCC) - stable cont nebulizer . Cor pulmonale (HCC) - stable  . Depression with anxiety - change xanax to PRN possibly using excessively at home . Essential hypertension - currently stable . Hypothyroidism following radioiodine therapy - check TSH cont  synthroid . Polysubstance abuse ([place]) - no longer uses cocain . Protein-calorie malnutrition, severe ([place]) - nutritional consult . Respiratory failure with hypercapnia (HCC) - admit to  Step down on BIPAP, elink is aware will repeat ABG   Other plan as per orders.  DVT prophylaxis:   SCD Lovenox     Code Status:  FULL CODE as per patient  I had personally discussed CODE STATUS with patient     Family Communication:   Family not  at  Bedside    Disposition Plan:    likely will need placement for rehabilitation                                      Would benefit from PT/OT eval prior to DC  ordered                        Nutrition  consulted                          Consults called:    Admission status:    inpatient       Level of care     SDU         Janille Draughon 03/24/2018, 9:27 PM   [facility]  Pager [phone]   after 2 AM please page floor coverage PA If 7AM-7PM, please contact the day team taking care of the patient  [email or web address]  Password [email or web address]

## 2018-03-24 NOTE — Progress Notes (Signed)
Settings adjusted on the NIV machine, trying to get adequate ventilation. Pt is not getting good minute ventilation on the machine. Patient has a Good mask seal. MD aware

## 2018-03-24 NOTE — ED Notes (Signed)
Respiratory called for bipap.

## 2018-03-24 NOTE — Progress Notes (Signed)
Pt admitted to floor. No family at bedside. Pt alert to location and self. Stating that she is SOB. Taken off the Bipap for 2 mins for mouth care and pt de sat into the 70's. Back on Bipap and recovered. Bed alarm on.

## 2018-03-25 ENCOUNTER — Other Ambulatory Visit: Payer: Self-pay

## 2018-03-25 LAB — COMPREHENSIVE METABOLIC PANEL
ALBUMIN: 3.3 g/dL — AB (ref 3.5–5.0)
ALK PHOS: 43 U/L (ref 38–126)
ALT: 9 U/L — AB (ref 14–54)
AST: 17 U/L (ref 15–41)
Anion gap: 11 (ref 5–15)
BUN: 10 mg/dL (ref 6–20)
CALCIUM: 9.4 mg/dL (ref 8.9–10.3)
CO2: 41 mmol/L — AB (ref 22–32)
CREATININE: 0.71 mg/dL (ref 0.44–1.00)
Chloride: 95 mmol/L — ABNORMAL LOW (ref 101–111)
GFR calc Af Amer: >60 mL/min (ref >60)
GFR calc non Af Amer: >60 mL/min (ref >60)
GLUCOSE: 88 mg/dL (ref 65–99)
Potassium: 4 mmol/L (ref 3.5–5.1)
SODIUM: 147 mmol/L — AB (ref 135–145)
Total Bilirubin: 0.9 mg/dL (ref 0.3–1.2)
Total Protein: 6.3 g/dL — ABNORMAL LOW (ref 6.5–8.1)

## 2018-03-25 LAB — BLOOD GAS, ARTERIAL
Acid-Base Excess: 16.4 mmol/L — ABNORMAL HIGH (ref 0.0–2.0)
Bicarbonate: 43.6 mmol/L — ABNORMAL HIGH (ref 20.0–28.0)
Delivery systems: POSITIVE
Drawn by: 313941
Expiratory PAP: 5
FIO2: 35
INSPIRATORY PAP: 20
LHR: 20 {breaths}/min
O2 Saturation: 92.5 %
PATIENT TEMPERATURE: 97.7
PO2 ART: 63.6 mmHg — AB (ref 83.0–108.0)
pCO2 arterial: 93.4 mmHg (ref 32.0–48.0)
pH, Arterial: 7.288 — ABNORMAL LOW (ref 7.350–7.450)

## 2018-03-25 LAB — CBC
HCT: 40.9 % (ref 36.0–46.0)
Hemoglobin: 11.6 g/dL — ABNORMAL LOW (ref 12.0–15.0)
MCH: 30.3 pg (ref 26.0–34.0)
MCHC: 28.4 g/dL — ABNORMAL LOW (ref 30.0–36.0)
MCV: 106.8 fL — ABNORMAL HIGH (ref 78.0–100.0)
PLATELETS: 150 10*3/uL (ref 150–400)
RBC: 3.83 MIL/uL — ABNORMAL LOW (ref 3.87–5.11)
RDW: 13.3 % (ref 11.5–15.5)
WBC: 5 10*3/uL (ref 4.0–10.5)

## 2018-03-25 LAB — MAGNESIUM: Magnesium: 1.3 mg/dL — ABNORMAL LOW (ref 1.7–2.4)

## 2018-03-25 LAB — URINE CULTURE: CULTURE: NO GROWTH

## 2018-03-25 LAB — TSH: TSH: 0.378 u[IU]/mL (ref 0.350–4.500)

## 2018-03-25 LAB — TROPONIN I
Troponin I: <0.03 ng/mL (ref <0.03)
Troponin I: <0.03 ng/mL (ref <0.03)

## 2018-03-25 LAB — PHOSPHORUS: Phosphorus: 1.8 mg/dL — ABNORMAL LOW (ref 2.5–4.6)

## 2018-03-25 LAB — PREALBUMIN: Prealbumin: 14.1 mg/dL — ABNORMAL LOW (ref 18–38)

## 2018-03-25 LAB — MRSA PCR SCREENING: MRSA BY PCR: NEGATIVE

## 2018-03-25 MED ORDER — MAGNESIUM SULFATE 4 GM/100ML IV SOLN
4.0000 g | Freq: Once | INTRAVENOUS | Status: AC
  Administered 2018-03-25: 4 g via INTRAVENOUS
  Filled 2018-03-25: qty 100

## 2018-03-25 MED ORDER — CHLORHEXIDINE GLUCONATE 0.12 % MT SOLN
15.0000 mL | Freq: Two times a day (BID) | OROMUCOSAL | Status: DC
  Administered 2018-03-25 – 2018-03-26 (×3): 15 mL via OROMUCOSAL
  Filled 2018-03-25 (×3): qty 15

## 2018-03-25 MED ORDER — ORAL CARE MOUTH RINSE
15.0000 mL | Freq: Two times a day (BID) | OROMUCOSAL | Status: DC
  Administered 2018-03-25: 15 mL via OROMUCOSAL

## 2018-03-25 NOTE — Progress Notes (Signed)
PROGRESS NOTE    Jasmine [name]  QBH:[id number] DOB: 11-Feb-1946 DOA: 03/24/2018 PCP: Biagio Borg, MD    Brief Narrative:  Jasmine [name] a 72 y.o.femalewith medical history ofCOPD on 3-4 L of O2, cocaine and tobacco abuse in the past, HTN, cor pulmonale. Discharged on 6/4 after being treated for acute on chronic respiratory failure from acute copd exacerbation, vs medication overdose, comes back the next day for altered mental status.    Assessment & Plan:   Active Problems:   Depression with anxiety   Essential hypertension   COPD (chronic obstructive pulmonary disease) (HCC)   Cor pulmonale (HCC)   Protein-calorie malnutrition, severe (HCC)   Hypothyroidism following radioiodine therapy   Chronic diastolic congestive heart failure (HCC)   Polysubstance abuse (HCC)   Acute on chronic respiratory failure with hypercapnia (HCC)   Respiratory failure with hypercapnia (HCC)   Acute encephalopathy;  Possibly secondary to acute respiratory acidosis from hypercapnia vs xanax use.  Improved and she is alert and awake this afternoon.    Acute on chronic respiratory failure with hypercapnia:  Of unclear etiology. No evidence of pneumonia on CXR.  Suspect from xanax.  She ison BIPAP this am and transition to [place] oxygen if appropriate .     Chronic diastolic heart failure:  She appears compensated.  No change in medications.    Severe protein calorie malnutrition:  Nutrition consulted.    COPD: Resume duonebs. No wheezing heard on exam, do no suspect any acute exacerbation.   Hypertension;  Well controlled.    Hypothyroidism;  Resume synthroid.    Hypomagnesemia: Replaced. Repeat in am.    DVT prophylaxis: lovenox.  Code Status: full code.  Family Communication: none at bedside.  Disposition Plan:pending resolution of respiratory failure .  Consultants:   None.    Procedures: none.    Antimicrobials:none.    Subjective: No chest pain or sob.  No nausea, or vomiting. Reports feeling exhausted.   Objective: Vitals:   03/25/18 0735 03/25/18 0741 03/25/18 1204 03/25/18 1504  BP: 119/77  126/68   Pulse: 68  (!) 103   Resp: 17  19   Temp: (!) 97.1 F (36.2 C)  98.2 F (36.8 C)   TempSrc: Axillary  Oral   SpO2: 98% 100% 95% 100%  Weight:      Height:        Intake/Output Summary (Last 24 hours) at 03/25/2018 1554 Last data filed at 03/25/2018 0535 Gross per 24 hour  Intake 375 ml  Output -  Net 375 ml   Filed Weights   03/24/18 2306  Weight: 54.7 kg (120 lb 9.5 oz)    Examination:  General exam: Appears calm and comfortable on BIPAP earlier this am.  Respiratory system:diminished air entry bilateral, no wheezing heard.  Cardiovascular system: S1 & S2 heard, RRR. No JVD,  No pedal edema. Gastrointestinal system: Abdomen is nondistended, soft and nontender. No organomegaly or masses felt. Normal bowel sounds heard. Central nervous system: Alert and oriented. No focal neurological deficits. Extremities: Symmetric 5 x 5 power. Skin: No rashes, lesions or ulcers Psychiatry: . Mood & affect appropriate.     Data Reviewed: I have personally reviewed following labs and imaging studies  CBC: Recent Labs  Lab 03/22/18 1400 03/24/18 1612 03/24/18 1620 03/25/18 0535  WBC 4.6 4.6  --  5.0  NEUTROABS  --  3.0  --   --   HGB 14.0 15.0 17.7* 11.6*  HCT 48.0* 52.0* 52.0* 40.9  MCV 107.1* 105.3*  --  106.8*  PLT 155 156  --  542   Basic Metabolic Panel: Recent Labs  Lab 03/22/18 1400 03/24/18 1612 03/24/18 1620 03/25/18 0535  NA 142 142 139 147*  K 4.3 5.1 4.9 4.0  CL 84* 88* 90* 95*  CO2 39* 41*  --  41*  GLUCOSE 115* 105* 103* 88  BUN 11 11 16 10   CREATININE 0.62 0.58 0.70 0.71  CALCIUM 9.8 9.7  --  9.4  MG  --   --   --  1.3*  PHOS  --   --   --  1.8*   GFR: Estimated Creatinine Clearance: 48.6 mL/min (by C-G formula based on SCr of 0.71 mg/dL). Liver Function Tests: Recent Labs  Lab 03/22/18 1400  03/24/18 1612 03/25/18 0535  AST 24 27 17   ALT 12* 10* 9*  ALKPHOS 56 54 43  BILITOT 1.2 1.0 0.9  PROT 8.1 8.2* 6.3*  ALBUMIN 4.4 4.4 3.3*   No results for input(s): LIPASE, AMYLASE in the last 168 hours. Recent Labs  Lab 03/24/18 1612  AMMONIA 56*   Coagulation Profile: No results for input(s): INR, PROTIME in the last 168 hours. Cardiac Enzymes: Recent Labs  Lab 03/24/18 2116 03/25/18 0010 03/25/18 0535  TROPONINI <0.03 <0.03 <0.03   BNP (last 3 results) No results for input(s): PROBNP in the last 8760 hours. HbA1C: No results for input(s): HGBA1C in the last 72 hours. CBG: Recent Labs  Lab 03/24/18 1618  GLUCAP 107*   Lipid Profile: No results for input(s): CHOL, HDL, LDLCALC, TRIG, CHOLHDL, LDLDIRECT in the last 72 hours. Thyroid Function Tests: Recent Labs    03/25/18 0010  TSH 0.378   Anemia Panel: No results for input(s): VITAMINB12, FOLATE, FERRITIN, TIBC, IRON, RETICCTPCT in the last 72 hours. Sepsis Labs: Recent Labs  Lab 03/24/18 1621  LATICACIDVEN 1.13    Recent Results (from the past 240 hour(s))  MRSA PCR Screening     Status: None   Collection Time: 03/22/18  8:32 PM  Result Value Ref Range Status   MRSA by PCR NEGATIVE NEGATIVE Final    Comment:        The GeneXpert MRSA Assay (FDA approved for NASAL specimens only), is one component of a comprehensive MRSA colonization surveillance program. It is not intended to diagnose MRSA infection nor to guide or monitor treatment for MRSA infections. Performed at [facility], [place] [address], [place], [place] [address]   Urine culture     Status: None   Collection Time: 03/24/18  7:25 PM  Result Value Ref Range Status   Specimen Description URINE, RANDOM  Final   Special Requests NONE  Final   Culture   Final    NO GROWTH Performed at [facility], [place] [address], [place], [place] [address]    Report Status 03/25/2018 FINAL  Final  MRSA PCR Screening      Status: None   Collection Time: 03/24/18 11:03 PM  Result Value Ref Range Status   MRSA by PCR NEGATIVE NEGATIVE Final    Comment:        The GeneXpert MRSA Assay (FDA approved for NASAL specimens only), is one component of a comprehensive MRSA colonization surveillance program. It is not intended to diagnose MRSA infection nor to guide or monitor treatment for MRSA infections. Performed at [facility], [place] [address], [place], [place] [address]          Radiology  Studies: Dg Chest 2 View  Result Date: 03/24/2018 CLINICAL DATA:  Shortness of breath, COPD, lung cancer EXAM: CHEST - 2 VIEW COMPARISON:  Chest radiographs dated 03/22/2018. CT chest dated 12/16/2017. FINDINGS: Platelike opacity in the right upper lobe, corresponding to radiation changes on prior CT. No focal consolidation.  No pleural effusion or pneumothorax. The heart is normal in size. Visualized osseous structures are within normal limits. IMPRESSION: No evidence of acute cardiopulmonary disease. Electronically Signed   By: Julian Hy M.D.   On: 03/24/2018 16:55   Ct Head Wo Contrast  Result Date: 03/24/2018 CLINICAL DATA:  Altered level of consciousness EXAM: CT HEAD WITHOUT CONTRAST TECHNIQUE: Contiguous axial images were obtained from the base of the skull through the vertex without intravenous contrast. COMPARISON:  01/07/2018 FINDINGS: Brain: No acute intracranial abnormality. Specifically, no hemorrhage, hydrocephalus, mass lesion, acute infarction, or significant intracranial injury. Vascular: No hyperdense vessel or unexpected calcification. Skull: No acute calvarial abnormality. Sinuses/Orbits: Visualized paranasal sinuses and mastoids clear. Orbital soft tissues unremarkable. Other: None IMPRESSION: No acute intracranial abnormality. Electronically Signed   By: Rolm Baptise M.D.   On: 03/24/2018 19:13        Scheduled Meds: . aspirin EC  81 mg Oral Daily  . chlorhexidine  15 mL Mouth Rinse  BID  . enoxaparin (LOVENOX) injection  30 mg Subcutaneous Q24H  . levothyroxine  75 mcg Oral QAC breakfast  . mouth rinse  15 mL Mouth Rinse q12n4p   Continuous Infusions:   LOS: 1 day    Time spent: 35 minutes.     Hosie Poisson, MD [facility] Pager [phone]  If 7PM-7AM, please contact night-coverage [email or web address] Password [facility] 03/25/2018, 3:54 PM

## 2018-03-25 NOTE — Progress Notes (Signed)
Pt up in chair.  Talking appropriately Took pt off BIPAP and placed on 5L [place]

## 2018-03-25 NOTE — Care Management Note (Addendum)
Case Management Note  Patient Details  Name: KASIDI SHANKER MRN: [id number] Date of Birth: 07/23/1946  Subjective/Objective:  From home with spouse who is with her 24/7 and is her caregiver, presents with acute/chronic resp failure, was put on bipap,now on 5 liters [place], she was dc from [facility].  She has hx of copd, chf,  co2 was 109, she is alert and oriented.  Per pt eval rec HHPT, they chose [facility] referral made to [facility] with [facility] , soc will begin 24-48 hrs post dc.  She has home oxygen with [employer] 4 liters, she has a rolling walker and a w/chair at home.                  Action/Plan: DC home with [facility] services when ready, will need HH orders for HHRN, HHPT.  Expected Discharge Date:                  Expected Discharge Plan:  [place]  In-House Referral:     Discharge planning Services  CM Consult  Post Acute Care Choice:  Home Health Choice offered to:  Patient, Spouse  DME Arranged:    DME Agency:     HH Arranged:  RN, Disease Management, PT [place] Agency:  [place]  Status of Service:  In process, will continue to follow  If discussed at Long Length of Stay Meetings, dates discussed:    Additional Comments:  Zenon Mayo, RN 03/25/2018, 12:29 PM

## 2018-03-25 NOTE — Progress Notes (Signed)
Pt resting comfortably on nasal cannula at this time. BiPAP not needed, but is on standby in pt room if needed. RT to monitor as needed

## 2018-03-25 NOTE — Progress Notes (Addendum)
Occupational Therapy Evaluation Patient Details Name: Jasmine [name] MRN: [id number] DOB: 04/21/1946 Today's Date: 03/25/2018    History of Present Illness 72 y.o. female with medical history significant of HTN, COPDon 3-4 L of O2, cor pulmonale, chronic diastolic CHF,  chronic respiratory failure with hypercapnia cocaine and tobacco abuse in the past,Presented with  Altered mental status Admitted for acute on chronic respiratory failure with hypercarbia   Clinical Impression   PTA, pt was living at home with her significant other, was minA for UB bathing and modified independent with all other ADLs/IADLs with intermittent use of her RW while shopping. Pt currently requires [place] A for ADL and functional mobility. Upon arrival O2 100% 4Lnc, HR 80, RR 21, BP 107/67 sitting; desat with mobility, poor waveform and pt not symptomatic; O2 94 with return sitting. Pt reports significant other is only available support. Due to below listed deficits, feel pt would benefit from [facility] and [place]. Pt will benefit from acute OT to address establish goals to facilitate safe D/C home.       Follow Up Recommendations  Home health OT;Supervision/Assistance - 24 hour    Equipment Recommendations  None recommended by OT    Recommendations for Other Services PT consult     Precautions / Restrictions Precautions Precautions: Fall Restrictions Weight Bearing Restrictions: No      Mobility Bed Mobility                  Transfers Overall transfer level: Needs assistance Equipment used: Rolling walker (2 wheeled) Transfers: Sit to/from Stand Sit to Stand: Min guard         General transfer comment: pt impulsive, min guard for steadying upon standing and VC for safe hand placement    Balance Overall balance assessment: Needs assistance Sitting-balance support: No upper extremity supported;Feet unsupported Sitting balance-Leahy Scale: Good Sitting balance - Comments: able to reach  down/figure-4 to pull up socks   Standing balance support: Bilateral upper extremity supported;During functional activity Standing balance-Leahy Scale: Fair                             ADL either performed or assessed with clinical judgement   ADL Overall ADL's : Needs assistance/impaired Eating/Feeding: Supervision/ safety   Grooming: Supervision/safety   Upper Body Bathing: Minimal assistance Upper Body Bathing Details (indicate cue type and reason): requires assistance to wash back Lower Body Bathing: Supervison/ safety;Sitting/lateral leans   Upper Body Dressing : Supervision/safety   Lower Body Dressing: Supervision/safety Lower Body Dressing Details (indicate cue type and reason): pt able to figure-4 Toilet Transfer: Min guard;Ambulation Toilet Transfer Details (indicate cue type and reason): simulated with recliner Toileting- Clothing Manipulation and Hygiene: Min guard;Cueing for safety       Functional mobility during ADLs: Min guard;Cueing for safety;Rolling walker(VC for proper positioning with RW) General ADL Comments: VC for safe hand placement and safe use of RW  2/4 DOE      Vision Baseline Vision/History: Wears glasses Wears Glasses: Reading only       Perception     Praxis      Pertinent Vitals/Pain Pain Assessment: No/denies pain     Hand Dominance Right   Extremity/Trunk Assessment Upper Extremity Assessment Upper Extremity Assessment: Overall WFL for tasks assessed   Lower Extremity Assessment Lower Extremity Assessment: Overall WFL for tasks assessed   Cervical / Trunk Assessment Cervical / Trunk Assessment: Normal   Communication Communication Communication: No  difficulties   Cognition Arousal/Alertness: Awake/alert Behavior During Therapy: WFL for tasks assessed/performed Overall Cognitive Status: Impaired/Different from baseline Area of Impairment: Attention;Safety/judgement                   Current  Attention Level: Selective     Safety/Judgement: Decreased awareness of deficits;Decreased awareness of safety Awareness: Emergent   General Comments: oriented to place (correctly stated [place], room#, floor#);   General Comments  upon arrival O2 100% 4Lnc, HR 80, RR 21, BP 107/67; desat with mobility, poor waveform and pt not symptomatic; O2 94 with return sitting;educated pt on environmental modifications to reduce risk of falls and energy conservation strategies    Exercises     Shoulder Instructions      Home Living Family/patient expects to be discharged to:: Private residence Living Arrangements: Spouse/significant other Available Help at Discharge: Family;Available 24 hours/day Type of Home: House Home Access: Ramped entrance     Home Layout: One level     Bathroom Shower/Tub: International aid/development worker Accessibility: Yes How Accessible: Accessible via walker Home Equipment: Walker - 4 wheels          Prior Functioning/Environment Level of Independence: Independent        Comments: requires 4 L supplemental O2, ambulates community distances without AD; reports running out of O2 during community mobility, uses cigarette lighter in car to charge and use O2.         OT Problem List: Decreased activity tolerance;Impaired balance (sitting and/or standing);Decreased coordination;Decreased safety awareness;Cardiopulmonary status limiting activity      OT Treatment/Interventions: Self-care/ADL training;Therapeutic exercise;Energy conservation;Therapeutic activities;Patient/family education;Balance training    OT Goals(Current goals can be found in the care plan section) Acute Rehab OT Goals Patient Stated Goal: to go home OT Goal Formulation: With patient Time For Goal Achievement: 04/08/18 Potential to Achieve Goals: Good ADL Goals Pt Will Perform Lower Body Dressing: Independently Pt Will Transfer to Toilet: with modified  independence;ambulating Additional ADL Goal #1: Pt will demonstrate understanding of 3 energy conservation techniques.  OT Frequency: Min 2X/week   Barriers to D/C: Decreased caregiver support(significant other is the only available support)  Pt lives with significant other, but stated "he gets tired too"       Co-evaluation              AM-PAC PT "6 Clicks" Daily Activity     Outcome Measure Help from another person eating meals?: None Help from another person taking care of personal grooming?: A Little Help from another person toileting, which includes using toliet, bedpan, or urinal?: A Little Help from another person bathing (including washing, rinsing, drying)?: A Little Help from another person to put on and taking off regular upper body clothing?: None Help from another person to put on and taking off regular lower body clothing?: None 6 Click Score: 21   End of Session Equipment Utilized During Treatment: Gait belt;Rolling walker Nurse Communication: Mobility status  Activity Tolerance: Patient tolerated treatment well Patient left: in chair;with call bell/phone within reach;with chair alarm set  OT Visit Diagnosis: Unsteadiness on feet (R26.81)                Time: 9024-0973 OT Time Calculation (min): 38 min Charges:    G-Codes:     Dorinda Hill OTS  Session completed under supervision of licensed clinician.  Maurie Boettcher, OT/L  OT Clinical Specialist [phone]     03/25/2018, 5:03 PM
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
## 2018-03-25 NOTE — Progress Notes (Signed)
OT Note - Addendum    03/25/18 1700  OT Visit Information  Last OT Received On 03/25/18  OT Time Calculation  OT Start Time (ACUTE ONLY) 1533  OT Stop Time (ACUTE ONLY) 1611  OT Time Calculation (min) 38 min  OT General Charges  $OT Visit 1 Visit  OT Evaluation  $OT Eval Moderate Complexity 1 Mod  OT Treatments  $Self Care/Home Management  8-22 mins  Maurie Boettcher, OT/L  OT Clinical Specialist [phone]

## 2018-03-25 NOTE — Telephone Encounter (Signed)
Called pt to make hops f/u appt. Pt states she will have to call us back because she is actually at an appt w/another provider now...Jasmine [name]

## 2018-03-25 NOTE — Progress Notes (Signed)
Patient asking for break from BiPAP at this time. Patient in no distress and is alert and oriented. Patient placed on 3 Lpm [place]. SATs 96%.  RN aware.

## 2018-03-25 NOTE — Progress Notes (Signed)
Decreased to 15/5

## 2018-03-26 ENCOUNTER — Telehealth: Payer: Self-pay | Admitting: Pulmonary Disease

## 2018-03-26 LAB — BASIC METABOLIC PANEL
Anion gap: 7 (ref 5–15)
BUN: 7 mg/dL (ref 6–20)
CALCIUM: 9 mg/dL (ref 8.9–10.3)
CO2: 41 mmol/L — AB (ref 22–32)
CREATININE: 0.53 mg/dL (ref 0.44–1.00)
Chloride: 93 mmol/L — ABNORMAL LOW (ref 101–111)
GFR calc Af Amer: >60 mL/min (ref >60)
GFR calc non Af Amer: >60 mL/min (ref >60)
GLUCOSE: 171 mg/dL — AB (ref 65–99)
Potassium: 3.7 mmol/L (ref 3.5–5.1)
Sodium: 141 mmol/L (ref 135–145)

## 2018-03-26 MED ORDER — ALPRAZOLAM 0.25 MG PO TABS: 0.2500 mg | ORAL_TABLET | Freq: Two times a day (BID) | ORAL | 0 refills | Status: DC | PRN

## 2018-03-26 MED ORDER — IPRATROPIUM-ALBUTEROL 0.5-2.5 (3) MG/3ML IN SOLN: 3.0000 mL | Freq: Four times a day (QID) | RESPIRATORY_TRACT | 1 refills | Status: DC | PRN

## 2018-03-26 MED ORDER — IPRATROPIUM-ALBUTEROL 0.5-2.5 (3) MG/3ML IN SOLN: 3.0000 mL | Freq: Four times a day (QID) | RESPIRATORY_TRACT | Status: DC | PRN

## 2018-03-26 NOTE — Progress Notes (Signed)
Nutrition Consult/Brief Note  RD consulted for malnutrition.  Pt denies any recent unintentional weight loss. Per readings below, weight's have fluctuated since 11/2017.  Wt Readings from Last 15 Encounters:  03/24/18 120 lb 9.5 oz (54.7 kg)  03/22/18 107 lb 9.4 oz (48.8 kg)  02/18/18 109 lb (49.4 kg)  01/26/18 112 lb 6.4 oz (51 kg)  01/08/18 108 lb 3.9 oz (49.1 kg)  12/17/17 111 lb 6.4 oz (50.5 kg)  09/15/17 111 lb 14.4 oz (50.8 kg)  08/31/17 109 lb (49.4 kg)  06/16/17 108 lb 12.8 oz (49.4 kg)  06/03/17 108 lb (49 kg)  02/23/17 110 lb (49.9 kg)  11/11/16 112 lb 12.8 oz (51.2 kg)  08/18/16 118 lb (53.5 kg)  08/13/16 116 lb 4.8 oz (52.8 kg)  07/10/16 116 lb (52.6 kg)   Body mass index is 23.55 kg/m. Patient meets criteria for Normal based on current BMI.   Pt reports a good appetite. Current diet order is Heart Healthy , patient is consuming approximately 75% of meals at this time. Labs and medications reviewed.   Nutrition focused physical exam completed.  No muscle or subcutaneous fat depletion noticed.  No nutrition interventions warranted at this time. If nutrition issues arise, please consult RD.   Arthur Holms, RD, LDN Pager #: [phone] After-Hours Pager #: [phone]

## 2018-03-26 NOTE — Care Management Note (Signed)
Case Management Note Original note by: Zenon Mayo, RN 03/25/2018, 12:29 PM    Patient Details  Name: Jasmine [name] MRN: [id number] Date of Birth: [birth year]  Subjective/Objective:  From home with spouse who is with her 24/7 and is her caregiver, presents with acute/chronic resp failure, was put on bipap,now on 5 liters [place], she was dc from [facility].  She has hx of copd, chf,  co2 was 109, she is alert and oriented.  Per pt eval rec HHPT, they chose [facility] referral made to [facility] with [facility] , soc will begin 24-48 hrs post dc.  She has home oxygen with [employer] 4 liters, she has a rolling walker and a w/chair at home.                  Action/Plan: DC home with [facility] services when ready, will need HH orders for HHRN, HHPT.  Expected Discharge Date:  03/26/18               Expected Discharge Plan:  [place]  In-House Referral:     Discharge planning Services  CM Consult  Post Acute Care Choice:  Home Health Choice offered to:  Patient, Spouse  DME Arranged:    DME Agency:     HH Arranged:  RN, Disease Management, PT, OT, Nurse's Aide, Social Work [employer] Agency:  [place]  Status of Service:  Completed, signed off  If discussed at [employer], dates discussed:    Additional Comments:  03/26/18 [name], RN, BSN Pt medically stable for dc home today with significant other and roommate.  Pt states she will have 24h care at discharge.  Notified [facility] of pt dc today, and orders for [facility], PT, OT, HHA and CSW.  Pt states that her S.O will transport home; instructed her to have him bring portable O2 tank for tranport home.    Ella Bodo, RN 03/26/2018, 2:43 PM

## 2018-03-26 NOTE — Progress Notes (Signed)
Pt stated she wanted to speak with someone from pulmonary. This RN notified Dr Karleen Hampshire, she will contact pulmonary.

## 2018-03-26 NOTE — Telephone Encounter (Signed)
Called pt again no answer LMOM to call back to make hosp f/u w/PCP.Marland KitchenJohny [name]

## 2018-03-26 NOTE — Progress Notes (Signed)
Physical Therapy Treatment Patient Details Name: Jasmine [name] MRN: [id number] DOB: 1945-12-04 Today's Date: 03/26/2018    History of Present Illness 72 y.o. female with medical history significant of HTN, COPDon 3-4 L of O2, cor pulmonale, chronic diastolic CHF,  chronic respiratory failure with hypercapnia cocaine and tobacco abuse in the past,Presented with  Altered mental status Admitted for acute on chronic respiratory failure with hypercarbia    PT Comments    Pt is off BiPAP today and able to ambulate with therapy. Pt currently mod I for bed mobility, and min guard for transfers and ambulation of 150 feet with RW. Pt able to maintain SaO2 greater than 92%O2 during ambulation on 4L O2 via nasal cannula. D/c recommendations remain appropriate at this time.     Follow Up Recommendations  Home health PT;Supervision/Assistance - 24 hour     Equipment Recommendations  None recommended by PT       Precautions / Restrictions Precautions Precautions: Fall Restrictions Weight Bearing Restrictions: No    Mobility  Bed Mobility Overal bed mobility: Modified Independent                Transfers Overall transfer level: Needs assistance Equipment used: Rolling walker (2 wheeled) Transfers: Sit to/from Stand Sit to Stand: Min guard         General transfer comment: min guard for safety  Ambulation/Gait Ambulation/Gait assistance: Min guard Ambulation Distance (Feet): 150 Feet Assistive device: Rolling walker (2 wheeled) Gait Pattern/deviations: Step-through pattern;Shuffle;Trunk flexed Gait velocity: slowed Gait velocity interpretation: <1.8 ft/sec, indicate of risk for recurrent falls General Gait Details: min guard for safety, slow, steady, shuffling gait, vc for upright posture      Balance Overall balance assessment: Needs assistance Sitting-balance support: No upper extremity supported;Feet unsupported Sitting balance-Leahy Scale: Good Sitting balance -  Comments: able to reach down/figure-4 to pull up socks   Standing balance support: Bilateral upper extremity supported;During functional activity Standing balance-Leahy Scale: Fair                              Cognition Arousal/Alertness: Awake/alert Behavior During Therapy: WFL for tasks assessed/performed Overall Cognitive Status: Within Functional Limits for tasks assessed Area of Impairment: Attention;Safety/judgement                   Current Attention Level: Selective       Awareness: Emergent   General Comments: pt with much more complete train of thought today      Exercises      General Comments General comments (skin integrity, edema, etc.): Pt on 4L O2 via nasal cannula, SaO2 >92% throughout session       Pertinent Vitals/Pain Pain Assessment: No/denies pain    Home Living Family/patient expects to be discharged to:: Private residence Living Arrangements: Spouse/significant other Available Help at Discharge: Family;Available 24 hours/day Type of Home: House Home Access: Ramped entrance   Home Layout: One level Home Equipment: Walker - 4 wheels      Prior Function Level of Independence: Independent      Comments: requires 4 L supplemental O2, ambulates community distances without AD; reports running out of O2 during community mobility, uses cigarette lighter in car to charge and use O2.    PT Goals (current goals can now be found in the care plan section) Acute Rehab PT Goals Patient Stated Goal: to go home PT Goal Formulation: With patient Time For Goal Achievement: 04/08/18 Potential to  Achieve Goals: Fair Progress towards PT goals: Progressing toward goals    Frequency    Min 3X/week      PT Plan Current plan remains appropriate       AM-PAC PT "6 Clicks" Daily Activity  Outcome Measure  Difficulty turning over in bed (including adjusting bedclothes, sheets and blankets)?: A Little Difficulty moving from lying on  back to sitting on the side of the bed? : A Little Difficulty sitting down on and standing up from a chair with arms (e.g., wheelchair, bedside commode, etc,.)?: Unable Help needed moving to and from a bed to chair (including a wheelchair)?: A Little Help needed walking in hospital room?: A Little Help needed climbing 3-5 steps with a railing? : A Lot 6 Click Score: 15    End of Session Equipment Utilized During Treatment: Gait belt;Oxygen Activity Tolerance: Patient tolerated treatment well Patient left: in bed;with call bell/phone within reach Nurse Communication: Mobility status PT Visit Diagnosis: Other abnormalities of gait and mobility (R26.89);Difficulty in walking, not elsewhere classified (R26.2);Unsteadiness on feet (R26.81)     Time: 9758-8325 PT Time Calculation (min) (ACUTE ONLY): 15 min  Charges:  $Gait Training: 8-22 mins                    G Codes:       Rex Magee B. Migdalia Dk PT, DPT Acute Rehabilitation  [phone] Pager [phone]     [place] 03/26/2018, 2:11 PM

## 2018-03-26 NOTE — Discharge Summary (Signed)
Physician Discharge Summary  Jasmine [name] ZOX:[id number] DOB: 25-Apr-1946 DOA: 03/24/2018  PCP: Biagio Borg, MD  Admit date: 03/24/2018 Discharge date: 03/27/2018  Admitted From: Home.  Disposition:  Home.   Recommendations for Outpatient Follow-up:  1. Follow up with PCP in 1-2 weeks 2. Please obtain BMP/CBC in one week 3. Please follow up with Dr Elsworth Soho in one week.   Home Health:yes    Discharge Condition:guarded.  CODE STATUS: full code.  Diet recommendation: Heart Healthy  Brief/Interim Summary: Jasmine Swendsen Booneis a 72 y.o.femalewith medical history ofCOPD on 3-4 L of O2, cocaine and tobacco abuse in the past, HTN, cor pulmonale. Discharged on 6/4 after being treated for acute on chronic respiratory failure from acute copd exacerbation, vs medication overdose, comes back the next day for altered mental status of unclear etiology. She was put on BIPAP overnight and transitioned to 3 lit of [place] Oxygen.    Discharge Diagnoses:  Active Problems:   Depression with anxiety   Essential hypertension   COPD (chronic obstructive pulmonary disease) (HCC)   Cor pulmonale (HCC)   Protein-calorie malnutrition, severe (HCC)   Hypothyroidism following radioiodine therapy   Chronic diastolic congestive heart failure (HCC)   Polysubstance abuse (HCC)   Acute on chronic respiratory failure with hypercapnia (HCC)   Respiratory failure with hypercapnia (HCC)  Acute encephalopathy;  Possibly secondary to acute respiratory acidosis from hypercapnia vs xanax use.  Improved and she is alert and awake this afternoon.    Acute on chronic respiratory failure with hypercapnia:  Of unclear etiology. No evidence of pneumonia on CXR.  Suspect from xanax.  She was on bipap briefly overnight and transitioned to [place] oxygen.     Chronic diastolic heart failure:  She appears compensated.  No change in medications.    Severe protein calorie malnutrition:  Nutrition consulted.     COPD: Resume duonebs. No wheezing heard on exam, do no suspect any acute exacerbation.   Hypertension;  Well controlled.    Hypothyroidism;  Resume synthroid.    Hypomagnesemia: Replaced.    Discharge Instructions  Discharge Instructions    Diet - low sodium heart healthy   Complete by:  As directed    Discharge instructions   Complete by:  As directed    Please follow up with Dr Elsworth Soho in the office as recommended.     Allergies as of 03/26/2018      Reactions   Alendronate Sodium Other (See Comments)   Patient does not remember this reaction      Medication List    TAKE these medications   albuterol (2.5 MG/3ML) 0.083% nebulizer solution Commonly known as:  PROVENTIL Take 3 mLs (2.5 mg total) by nebulization every 4 (four) hours as needed for wheezing.   albuterol 108 (90 Base) MCG/ACT inhaler Commonly known as:  PROAIR HFA Inhale 1-2 puffs into the lungs every 6 (six) hours as needed for wheezing or shortness of breath.   ALPRAZolam 0.25 MG tablet Commonly known as:  XANAX Take 1 tablet (0.25 mg total) by mouth 2 (two) times daily as needed for anxiety. What changed:    when to take this  reasons to take this   aspirin 81 MG EC tablet Take 1 tablet (81 mg total) by mouth daily.   citalopram 20 MG tablet Commonly known as:  CELEXA TAKE 1 TABLET BY MOUTH DAILY What changed:    how much to take  how to take this  when to take this  feeding supplement (ENSURE ENLIVE) Liqd Take 237 mLs by mouth 3 (three) times daily between meals.   fluticasone furoate-vilanterol 200-25 MCG/INH Aepb Commonly known as:  BREO ELLIPTA Inhale 1 puff into the lungs daily.   furosemide 40 MG tablet Commonly known as:  LASIX Take 1 tablet (40 mg total) by mouth daily. What changed:  when to take this   ipratropium-albuterol 0.5-2.5 (3) MG/3ML Soln Commonly known as:  DUONEB Take 3 mLs by nebulization every 6 (six) hours as needed.   levothyroxine 75  MCG tablet Commonly known as:  SYNTHROID, LEVOTHROID TAKE 1 TABLET BY MOUTH DAILY BEFORE BREAKFAST.   OXYGEN Inhale 4 L into the lungs continuous.   potassium chloride 10 MEQ tablet Commonly known as:  K-DUR Take 1 tablet (10 mEq total) by mouth 2 (two) times daily.   tiotropium 18 MCG inhalation capsule Commonly known as:  SPIRIVA HANDIHALER Place 1 capsule (18 mcg total) into inhaler and inhale daily.      Follow-up Information    Rigoberto Noel, MD. Schedule an appointment as soon as possible for a visit in 1 week(s).   Specialty:  Pulmonary Disease Contact information: [address] [place] [address] [phone]          Allergies  Allergen Reactions  . Alendronate Sodium Other (See Comments)    Patient does not remember this reaction    Consultations:  None.    Procedures/Studies: Dg Chest 2 View  Result Date: 03/24/2018 CLINICAL DATA:  Shortness of breath, COPD, lung cancer EXAM: CHEST - 2 VIEW COMPARISON:  Chest radiographs dated 03/22/2018. CT chest dated 12/16/2017. FINDINGS: Platelike opacity in the right upper lobe, corresponding to radiation changes on prior CT. No focal consolidation.  No pleural effusion or pneumothorax. The heart is normal in size. Visualized osseous structures are within normal limits. IMPRESSION: No evidence of acute cardiopulmonary disease. Electronically Signed   By: Julian Hy M.D.   On: 03/24/2018 16:55   Dg Chest 2 View  Result Date: 03/22/2018 CLINICAL DATA:  72 year old female with altered mental status, shortness of breath. EXAM: CHEST - 2 VIEW COMPARISON:  Chest radiographs 01/08/2018 and earlier. Chest CT 12/16/2017 FINDINGS: Stable lung volumes. Chronic scarring along the superior aspect of the minor fissure is unchanged. Centrilobular emphysema. No pneumothorax, pulmonary edema, pleural effusion or acute pulmonary opacity. Tortuous thoracic aorta. Other mediastinal contours are within normal limits. Visualized  tracheal air column is within normal limits. No acute osseous abnormality identified. Negative visible bowel gas pattern. IMPRESSION: Emphysema (ICD10-J43.9). No acute cardiopulmonary abnormality. Electronically Signed   By: Genevie Ann M.D.   On: 03/22/2018 14:46   Ct Head Wo Contrast  Result Date: 03/24/2018 CLINICAL DATA:  Altered level of consciousness EXAM: CT HEAD WITHOUT CONTRAST TECHNIQUE: Contiguous axial images were obtained from the base of the skull through the vertex without intravenous contrast. COMPARISON:  01/07/2018 FINDINGS: Brain: No acute intracranial abnormality. Specifically, no hemorrhage, hydrocephalus, mass lesion, acute infarction, or significant intracranial injury. Vascular: No hyperdense vessel or unexpected calcification. Skull: No acute calvarial abnormality. Sinuses/Orbits: Visualized paranasal sinuses and mastoids clear. Orbital soft tissues unremarkable. Other: None IMPRESSION: No acute intracranial abnormality. Electronically Signed   By: Rolm Baptise M.D.   On: 03/24/2018 19:13      Subjective:  No chest pain or sob.    Discharge Exam: Vitals:   03/26/18 0906 03/26/18 1239  BP: 121/63 (!) 117/104  Pulse: 83 89  Resp: 14 15  Temp: 97.7 F (36.5 C)  98.3 F (36.8 C)  SpO2: 99% 99%   Vitals:   03/26/18 0421 03/26/18 0700 03/26/18 0906 03/26/18 1239  BP:   121/63 (!) 117/104  Pulse:   83 89  Resp:   14 15  Temp: 98.4 F (36.9 C)  97.7 F (36.5 C) 98.3 F (36.8 C)  TempSrc: Oral  Oral Oral  SpO2:  100% 99% 99%  Weight:      Height:        General: Pt is alert, awake, not in acute distress Cardiovascular: RRR, S1/S2 +, no rubs, no gallops Respiratory: CTA bilaterally, no wheezing, no rhonchi Abdominal: Soft, NT, ND, bowel sounds + Extremities: no edema, no cyanosis    The results of significant diagnostics from this hospitalization (including imaging, microbiology, ancillary and laboratory) are listed below for reference.      Microbiology: Recent Results (from the past 240 hour(s))  MRSA PCR Screening     Status: None   Collection Time: 03/22/18  8:32 PM  Result Value Ref Range Status   MRSA by PCR NEGATIVE NEGATIVE Final    Comment:        The GeneXpert MRSA Assay (FDA approved for NASAL specimens only), is one component of a comprehensive MRSA colonization surveillance program. It is not intended to diagnose MRSA infection nor to guide or monitor treatment for MRSA infections. Performed at [facility], [place] [address] [place], [place] [address]   Urine culture     Status: None   Collection Time: 03/24/18  7:25 PM  Result Value Ref Range Status   Specimen Description URINE, RANDOM  Final   Special Requests NONE  Final   Culture   Final    NO GROWTH Performed at [facility], [place] [address] [place], [place] [address]    Report Status 03/25/2018 FINAL  Final  MRSA PCR Screening     Status: None   Collection Time: 03/24/18 11:03 PM  Result Value Ref Range Status   MRSA by PCR NEGATIVE NEGATIVE Final    Comment:        The GeneXpert MRSA Assay (FDA approved for NASAL specimens only), is one component of a comprehensive MRSA colonization surveillance program. It is not intended to diagnose MRSA infection nor to guide or monitor treatment for MRSA infections. Performed at [facility], [place] [address] [place], [place] [address]      Labs: BNP (last 3 results) No results for input(s): BNP in the last 8760 hours. Basic Metabolic Panel: Recent Labs  Lab 03/22/18 1400 03/24/18 1612 03/24/18 1620 03/25/18 0535 03/26/18 1039  NA 142 142 139 147* 141  K 4.3 5.1 4.9 4.0 3.7  CL 84* 88* 90* 95* 93*  CO2 39* 41*  --  41* 41*  GLUCOSE 115* 105* 103* 88 171*  BUN 11 11 16 10 7   CREATININE 0.62 0.58 0.70 0.71 0.53  CALCIUM 9.8 9.7  --  9.4 9.0  MG  --   --   --  1.3*  --   PHOS  --   --   --  1.8*  --    Liver Function Tests: Recent Labs  Lab  03/22/18 1400 03/24/18 1612 03/25/18 0535  AST 24 27 17   ALT 12* 10* 9*  ALKPHOS 56 54 43  BILITOT 1.2 1.0 0.9  PROT 8.1 8.2* 6.3*  ALBUMIN 4.4 4.4 3.3*   No results for input(s): LIPASE, AMYLASE in the last 168 hours. Recent Labs  Lab  03/24/18 1612  AMMONIA 56*   CBC: Recent Labs  Lab 03/22/18 1400 03/24/18 1612 03/24/18 1620 03/25/18 0535  WBC 4.6 4.6  --  5.0  NEUTROABS  --  3.0  --   --   HGB 14.0 15.0 17.7* 11.6*  HCT 48.0* 52.0* 52.0* 40.9  MCV 107.1* 105.3*  --  106.8*  PLT 155 156  --  150   Cardiac Enzymes: Recent Labs  Lab 03/24/18 2116 03/25/18 0010 03/25/18 0535  TROPONINI <0.03 <0.03 <0.03   BNP: Invalid input(s): POCBNP CBG: Recent Labs  Lab 03/24/18 1618  GLUCAP 107*   D-Dimer No results for input(s): DDIMER in the last 72 hours. Hgb A1c No results for input(s): HGBA1C in the last 72 hours. Lipid Profile No results for input(s): CHOL, HDL, LDLCALC, TRIG, CHOLHDL, LDLDIRECT in the last 72 hours. Thyroid function studies Recent Labs    03/25/18 0010  TSH 0.378   Anemia work up No results for input(s): VITAMINB12, FOLATE, FERRITIN, TIBC, IRON, RETICCTPCT in the last 72 hours. Urinalysis    Component Value Date/Time   COLORURINE YELLOW 03/24/2018 1925   APPEARANCEUR HAZY (A) 03/24/2018 1925   LABSPEC 1.024 03/24/2018 1925   PHURINE 6.0 03/24/2018 1925   GLUCOSEU NEGATIVE 03/24/2018 1925   GLUCOSEU NEGATIVE 01/11/2015 1723   HGBUR NEGATIVE 03/24/2018 1925   BILIRUBINUR NEGATIVE 03/24/2018 1925   BILIRUBINUR negative 08/11/2011 1358   KETONESUR 20 (A) 03/24/2018 1925   PROTEINUR 30 (A) 03/24/2018 1925   UROBILINOGEN 0.2 01/11/2015 1723   NITRITE NEGATIVE 03/24/2018 1925   LEUKOCYTESUR MODERATE (A) 03/24/2018 1925   Sepsis Labs Invalid input(s): PROCALCITONIN,  WBC,  LACTICIDVEN Microbiology Recent Results (from the past 240 hour(s))  MRSA PCR Screening     Status: None   Collection Time: 03/22/18  8:32 PM  Result Value Ref  Range Status   MRSA by PCR NEGATIVE NEGATIVE Final    Comment:        The GeneXpert MRSA Assay (FDA approved for NASAL specimens only), is one component of a comprehensive MRSA colonization surveillance program. It is not intended to diagnose MRSA infection nor to guide or monitor treatment for MRSA infections. Performed at [facility], [place] [address], [place], [place] [address]   Urine culture     Status: None   Collection Time: 03/24/18  7:25 PM  Result Value Ref Range Status   Specimen Description URINE, RANDOM  Final   Special Requests NONE  Final   Culture   Final    NO GROWTH Performed at [facility], [place] [address], [place], [place] [address]    Report Status 03/25/2018 FINAL  Final  MRSA PCR Screening     Status: None   Collection Time: 03/24/18 11:03 PM  Result Value Ref Range Status   MRSA by PCR NEGATIVE NEGATIVE Final    Comment:        The GeneXpert MRSA Assay (FDA approved for NASAL specimens only), is one component of a comprehensive MRSA colonization surveillance program. It is not intended to diagnose MRSA infection nor to guide or monitor treatment for MRSA infections. Performed at [facility], [place] [address], [place], [place] [address]      Time coordinating discharge: 32 minutes  SIGNED:   Hosie Poisson, MD  [facility] 03/27/2018, 9:53 AM Pager   If 7PM-7AM, please contact night-coverage [email or web address] Password [email or web address]

## 2018-03-26 NOTE — Telephone Encounter (Signed)
Spoke with pt. States that she is currently at [facility]. She would like for Dr. Elsworth Soho to come see her. Advised her that Dr. Elsworth Soho is working at [employer]. Advised her to let her nurse know that she would like for someone from pulmonary to come and speak with her. She agreed and verbalized understanding. Nothing further was needed.

## 2018-03-26 NOTE — Telephone Encounter (Signed)
Duplicate message. 

## 2018-03-26 NOTE — Care Management Important Message (Signed)
Important Message  Patient Details  Name: Jasmine [name] MRN: [id number] Date of Birth: 19-Oct-1946   Medicare Important Message Given:  Yes    Orbie Pyo 03/26/2018, 3:24 PM

## 2018-03-27 ENCOUNTER — Emergency Department (HOSPITAL_COMMUNITY): Payer: Medicare HMO

## 2018-03-27 ENCOUNTER — Inpatient Hospital Stay (HOSPITAL_COMMUNITY)
Admission: EM | Admit: 2018-03-27 | Discharge: 2018-04-02 | DRG: 190 | Disposition: A | Discharge status: Home Health | Payer: Medicare HMO | Attending: Internal Medicine | Admitting: Internal Medicine

## 2018-03-27 ENCOUNTER — Encounter (HOSPITAL_COMMUNITY): Payer: Self-pay | Admitting: Oncology

## 2018-03-27 DIAGNOSIS — Z86718 Personal history of other venous thrombosis and embolism: Secondary | ICD-10-CM | POA: Diagnosis not present

## 2018-03-27 DIAGNOSIS — F329 Major depressive disorder, single episode, unspecified: Secondary | ICD-10-CM | POA: Diagnosis present

## 2018-03-27 DIAGNOSIS — R410 Disorientation, unspecified: Secondary | ICD-10-CM | POA: Diagnosis not present

## 2018-03-27 DIAGNOSIS — I10 Essential (primary) hypertension: Secondary | ICD-10-CM | POA: Diagnosis not present

## 2018-03-27 DIAGNOSIS — J431 Panlobular emphysema: Secondary | ICD-10-CM | POA: Diagnosis not present

## 2018-03-27 DIAGNOSIS — J9622 Acute and chronic respiratory failure with hypercapnia: Secondary | ICD-10-CM | POA: Diagnosis present

## 2018-03-27 DIAGNOSIS — E039 Hypothyroidism, unspecified: Secondary | ICD-10-CM | POA: Diagnosis not present

## 2018-03-27 DIAGNOSIS — H9193 Unspecified hearing loss, bilateral: Secondary | ICD-10-CM | POA: Diagnosis present

## 2018-03-27 DIAGNOSIS — F418 Other specified anxiety disorders: Secondary | ICD-10-CM | POA: Diagnosis present

## 2018-03-27 DIAGNOSIS — D7589 Other specified diseases of blood and blood-forming organs: Secondary | ICD-10-CM | POA: Diagnosis present

## 2018-03-27 DIAGNOSIS — M810 Age-related osteoporosis without current pathological fracture: Secondary | ICD-10-CM | POA: Diagnosis present

## 2018-03-27 DIAGNOSIS — F1310 Sedative, hypnotic or anxiolytic abuse, uncomplicated: Secondary | ICD-10-CM | POA: Diagnosis present

## 2018-03-27 DIAGNOSIS — G9340 Encephalopathy, unspecified: Secondary | ICD-10-CM | POA: Diagnosis not present

## 2018-03-27 DIAGNOSIS — Z9981 Dependence on supplemental oxygen: Secondary | ICD-10-CM | POA: Diagnosis not present

## 2018-03-27 DIAGNOSIS — Z8249 Family history of ischemic heart disease and other diseases of the circulatory system: Secondary | ICD-10-CM | POA: Diagnosis not present

## 2018-03-27 DIAGNOSIS — J329 Chronic sinusitis, unspecified: Secondary | ICD-10-CM | POA: Diagnosis present

## 2018-03-27 DIAGNOSIS — R531 Weakness: Secondary | ICD-10-CM | POA: Diagnosis not present

## 2018-03-27 DIAGNOSIS — Z90722 Acquired absence of ovaries, bilateral: Secondary | ICD-10-CM | POA: Diagnosis not present

## 2018-03-27 DIAGNOSIS — F1410 Cocaine abuse, uncomplicated: Secondary | ICD-10-CM | POA: Diagnosis present

## 2018-03-27 DIAGNOSIS — I110 Hypertensive heart disease with heart failure: Secondary | ICD-10-CM | POA: Diagnosis present

## 2018-03-27 DIAGNOSIS — G4733 Obstructive sleep apnea (adult) (pediatric): Secondary | ICD-10-CM | POA: Diagnosis present

## 2018-03-27 DIAGNOSIS — Z79890 Hormone replacement therapy (postmenopausal): Secondary | ICD-10-CM

## 2018-03-27 DIAGNOSIS — Z888 Allergy status to other drugs, medicaments and biological substances status: Secondary | ICD-10-CM

## 2018-03-27 DIAGNOSIS — J9612 Chronic respiratory failure with hypercapnia: Secondary | ICD-10-CM | POA: Diagnosis not present

## 2018-03-27 DIAGNOSIS — R7881 Bacteremia: Secondary | ICD-10-CM | POA: Diagnosis not present

## 2018-03-27 DIAGNOSIS — I5032 Chronic diastolic (congestive) heart failure: Secondary | ICD-10-CM | POA: Diagnosis not present

## 2018-03-27 DIAGNOSIS — F1910 Other psychoactive substance abuse, uncomplicated: Secondary | ICD-10-CM | POA: Diagnosis not present

## 2018-03-27 DIAGNOSIS — B9561 Methicillin susceptible Staphylococcus aureus infection as the cause of diseases classified elsewhere: Secondary | ICD-10-CM | POA: Diagnosis not present

## 2018-03-27 DIAGNOSIS — Z87891 Personal history of nicotine dependence: Secondary | ICD-10-CM

## 2018-03-27 DIAGNOSIS — E890 Postprocedural hypothyroidism: Secondary | ICD-10-CM | POA: Diagnosis not present

## 2018-03-27 DIAGNOSIS — J432 Centrilobular emphysema: Secondary | ICD-10-CM | POA: Diagnosis not present

## 2018-03-27 DIAGNOSIS — J9621 Acute and chronic respiratory failure with hypoxia: Secondary | ICD-10-CM | POA: Diagnosis present

## 2018-03-27 DIAGNOSIS — J449 Chronic obstructive pulmonary disease, unspecified: Secondary | ICD-10-CM | POA: Diagnosis present

## 2018-03-27 DIAGNOSIS — R0902 Hypoxemia: Secondary | ICD-10-CM | POA: Diagnosis not present

## 2018-03-27 DIAGNOSIS — F419 Anxiety disorder, unspecified: Secondary | ICD-10-CM

## 2018-03-27 DIAGNOSIS — J309 Allergic rhinitis, unspecified: Secondary | ICD-10-CM | POA: Diagnosis present

## 2018-03-27 DIAGNOSIS — I2781 Cor pulmonale (chronic): Secondary | ICD-10-CM | POA: Diagnosis not present

## 2018-03-27 DIAGNOSIS — G9349 Other encephalopathy: Secondary | ICD-10-CM | POA: Diagnosis present

## 2018-03-27 DIAGNOSIS — Z7982 Long term (current) use of aspirin: Secondary | ICD-10-CM

## 2018-03-27 DIAGNOSIS — Z79899 Other long term (current) drug therapy: Secondary | ICD-10-CM | POA: Diagnosis not present

## 2018-03-27 DIAGNOSIS — J441 Chronic obstructive pulmonary disease with (acute) exacerbation: Principal | ICD-10-CM

## 2018-03-27 DIAGNOSIS — R0602 Shortness of breath: Secondary | ICD-10-CM | POA: Diagnosis not present

## 2018-03-27 DIAGNOSIS — Z801 Family history of malignant neoplasm of trachea, bronchus and lung: Secondary | ICD-10-CM

## 2018-03-27 DIAGNOSIS — J9690 Respiratory failure, unspecified, unspecified whether with hypoxia or hypercapnia: Secondary | ICD-10-CM | POA: Diagnosis not present

## 2018-03-27 DIAGNOSIS — I491 Atrial premature depolarization: Secondary | ICD-10-CM | POA: Diagnosis not present

## 2018-03-27 DIAGNOSIS — E43 Unspecified severe protein-calorie malnutrition: Secondary | ICD-10-CM | POA: Diagnosis not present

## 2018-03-27 DIAGNOSIS — Z9119 Patient's noncompliance with other medical treatment and regimen: Secondary | ICD-10-CM

## 2018-03-27 LAB — CBC WITH DIFFERENTIAL/PLATELET
ABS IMMATURE GRANULOCYTES: 0 10*3/uL (ref 0.0–0.1)
BASOS ABS: 0 10*3/uL (ref 0.0–0.1)
Basophils Relative: 1 %
EOS PCT: 3 %
Eosinophils Absolute: 0.1 10*3/uL (ref 0.0–0.7)
HCT: 43 % (ref 36.0–46.0)
HEMOGLOBIN: 12.4 g/dL (ref 12.0–15.0)
Immature Granulocytes: 1 %
LYMPHS PCT: 29 %
Lymphs Abs: 1.1 10*3/uL (ref 0.7–4.0)
MCH: 30.5 pg (ref 26.0–34.0)
MCHC: 28.8 g/dL — AB (ref 30.0–36.0)
MCV: 105.9 fL — ABNORMAL HIGH (ref 78.0–100.0)
Monocytes Absolute: 0.6 10*3/uL (ref 0.1–1.0)
Monocytes Relative: 15 %
NEUTROS ABS: 2 10*3/uL (ref 1.7–7.7)
Neutrophils Relative %: 51 %
Platelets: 172 10*3/uL (ref 150–400)
RBC: 4.06 MIL/uL (ref 3.87–5.11)
RDW: 13.3 % (ref 11.5–15.5)
WBC: 3.8 10*3/uL — AB (ref 4.0–10.5)

## 2018-03-27 LAB — BRAIN NATRIURETIC PEPTIDE: B NATRIURETIC PEPTIDE 5: 26.4 pg/mL (ref 0.0–100.0)

## 2018-03-27 LAB — BASIC METABOLIC PANEL
Anion gap: 6 (ref 5–15)
CALCIUM: 9.2 mg/dL (ref 8.9–10.3)
CO2: 42 mmol/L — ABNORMAL HIGH (ref 22–32)
Chloride: 92 mmol/L — ABNORMAL LOW (ref 101–111)
Creatinine, Ser: 0.63 mg/dL (ref 0.44–1.00)
GFR calc Af Amer: >60 mL/min (ref >60)
Glucose, Bld: 114 mg/dL — ABNORMAL HIGH (ref 65–99)
POTASSIUM: 5.1 mmol/L (ref 3.5–5.1)
SODIUM: 140 mmol/L (ref 135–145)

## 2018-03-27 LAB — BLOOD GAS, VENOUS
ACID-BASE EXCESS: 18.4 mmol/L — AB (ref 0.0–2.0)
BICARBONATE: 45.2 mmol/L — AB (ref 20.0–28.0)
O2 Content: 2 L/min
O2 Saturation: 51.2 %
PH VEN: 7.325 (ref 7.250–7.430)
Patient temperature: 98.6
pCO2, Ven: 89.2 mmHg (ref 44.0–60.0)

## 2018-03-27 LAB — I-STAT TROPONIN, ED: TROPONIN I, POC: 0 ng/mL (ref 0.00–0.08)

## 2018-03-27 MED ORDER — ALPRAZOLAM 0.25 MG PO TABS: 0.2500 mg | ORAL_TABLET | Freq: Two times a day (BID) | ORAL | Status: DC | PRN

## 2018-03-27 MED ORDER — SODIUM CHLORIDE 0.9% FLUSH
3.0000 mL | Freq: Two times a day (BID) | INTRAVENOUS | Status: DC
  Administered 2018-03-28: 3 mL via INTRAVENOUS

## 2018-03-27 MED ORDER — LEVOTHYROXINE SODIUM 75 MCG PO TABS
75.0000 ug | ORAL_TABLET | Freq: Every day | ORAL | Status: DC
  Administered 2018-03-28 – 2018-04-02 (×6): 75 ug via ORAL
  Filled 2018-03-27 (×7): qty 1

## 2018-03-27 MED ORDER — ONDANSETRON HCL 4 MG/2ML IJ SOLN: 4.0000 mg | Freq: Four times a day (QID) | INTRAMUSCULAR | Status: DC | PRN

## 2018-03-27 MED ORDER — FUROSEMIDE 40 MG PO TABS
40.0000 mg | ORAL_TABLET | Freq: Two times a day (BID) | ORAL | Status: DC
  Administered 2018-03-28: 40 mg via ORAL
  Filled 2018-03-27: qty 1

## 2018-03-27 MED ORDER — SENNOSIDES-DOCUSATE SODIUM 8.6-50 MG PO TABS: 1.0000 | ORAL_TABLET | Freq: Every evening | ORAL | Status: DC | PRN

## 2018-03-27 MED ORDER — ENSURE ENLIVE PO LIQD
237.0000 mL | Freq: Three times a day (TID) | ORAL | Status: DC
  Administered 2018-03-28 – 2018-04-01 (×12): 237 mL via ORAL

## 2018-03-27 MED ORDER — ENOXAPARIN SODIUM 40 MG/0.4ML ~~LOC~~ SOLN
40.0000 mg | Freq: Every day | SUBCUTANEOUS | Status: DC
  Administered 2018-03-28 – 2018-04-02 (×6): 40 mg via SUBCUTANEOUS
  Filled 2018-03-27 (×7): qty 0.4

## 2018-03-27 MED ORDER — SODIUM CHLORIDE 0.9% FLUSH: 3.0000 mL | INTRAVENOUS | Status: DC | PRN

## 2018-03-27 MED ORDER — FLUTICASONE FUROATE-VILANTEROL 200-25 MCG/INH IN AEPB
1.0000 | INHALATION_SPRAY | Freq: Every day | RESPIRATORY_TRACT | Status: DC
  Administered 2018-03-29 – 2018-04-02 (×5): 1 via RESPIRATORY_TRACT
  Filled 2018-03-27: qty 28

## 2018-03-27 MED ORDER — IPRATROPIUM-ALBUTEROL 0.5-2.5 (3) MG/3ML IN SOLN
3.0000 mL | Freq: Three times a day (TID) | RESPIRATORY_TRACT | Status: DC
  Administered 2018-03-28 (×2): 3 mL via RESPIRATORY_TRACT
  Filled 2018-03-27 (×2): qty 3

## 2018-03-27 MED ORDER — CITALOPRAM HYDROBROMIDE 20 MG PO TABS
20.0000 mg | ORAL_TABLET | Freq: Every day | ORAL | Status: DC
  Administered 2018-03-28 – 2018-04-02 (×6): 20 mg via ORAL
  Filled 2018-03-27 (×6): qty 1

## 2018-03-27 MED ORDER — TRAMADOL HCL 50 MG PO TABS: 50.0000 mg | ORAL_TABLET | Freq: Four times a day (QID) | ORAL | Status: DC | PRN

## 2018-03-27 MED ORDER — IPRATROPIUM-ALBUTEROL 0.5-2.5 (3) MG/3ML IN SOLN
3.0000 mL | RESPIRATORY_TRACT | Status: DC
  Administered 2018-03-27 (×2): 3 mL via RESPIRATORY_TRACT
  Filled 2018-03-27: qty 9

## 2018-03-27 MED ORDER — SODIUM CHLORIDE 0.9% FLUSH: 3.0000 mL | Freq: Two times a day (BID) | INTRAVENOUS | Status: DC

## 2018-03-27 MED ORDER — ONDANSETRON HCL 4 MG PO TABS: 4.0000 mg | ORAL_TABLET | Freq: Four times a day (QID) | ORAL | Status: DC | PRN

## 2018-03-27 MED ORDER — ALBUTEROL SULFATE (2.5 MG/3ML) 0.083% IN NEBU: 2.5000 mg | INHALATION_SOLUTION | RESPIRATORY_TRACT | Status: DC | PRN

## 2018-03-27 MED ORDER — ACETAMINOPHEN 325 MG PO TABS: 650.0000 mg | ORAL_TABLET | Freq: Four times a day (QID) | ORAL | Status: DC | PRN

## 2018-03-27 MED ORDER — SODIUM CHLORIDE 0.9 % IV SOLN: 250.0000 mL | INTRAVENOUS | Status: DC | PRN

## 2018-03-27 MED ORDER — ASPIRIN EC 81 MG PO TBEC
81.0000 mg | DELAYED_RELEASE_TABLET | Freq: Every day | ORAL | Status: DC
Start: 2018-03-28 — End: 2018-04-02
  Administered 2018-03-28 – 2018-04-02 (×6): 81 mg via ORAL
  Filled 2018-03-27 (×6): qty 1

## 2018-03-27 MED ORDER — ACETAMINOPHEN 650 MG RE SUPP: 650.0000 mg | Freq: Four times a day (QID) | RECTAL | Status: DC | PRN

## 2018-03-27 NOTE — ED Notes (Signed)
1 light green, 1 dark green, 1 blue, 1 lavender top collected. Dark Green top is on rocker in [employer]. The rest are held at [facility].

## 2018-03-27 NOTE — ED Notes (Signed)
Blue top sent down with Lavender and Light Green top.

## 2018-03-27 NOTE — ED Triage Notes (Signed)
Pt bib [facility] from home d/t shob and weakness.  Pt d/c'd on 6/5.  Pt given 5 mg albuterol en route.  Chronic O2 @ 4L.  Pt also c/o poor PO intake.

## 2018-03-27 NOTE — ED Provider Notes (Signed)
[facility] EMERGENCY DEPARTMENT Provider Note   CSN: [id number] Arrival date & time: 03/27/18  2021  History   Chief Complaint Chief Complaint  Patient presents with  . Shortness of Breath    HPI Jasmine [name] is a 72 y.o. female.  The history is provided by the patient and a caregiver. The history is limited by the condition of the patient (patient is poor historian).   72 yo F with PMHx of COPD (on 4L [place] baseline), HF, anxiety who presents for the third visit in a week for dyspnea. Patient states she remembers feeling dyspneic this morning, but denies symptoms currently. Per family member over the phone, patient has been altered, reportedly "talking out of her head" at home this week. Has also been sleeping more than normalHas been admitted twice in the past week for similar complaints, found to have hypercapnia. This was thought to be a combination of COPD exacerbation and xanax use. Was just discharged home yesterday, and per family member, slept from 4pm in the afternoon until the next morning. Patient and family denies any recent fevers, cough. Patient denies chest pain.    Past Medical History:  Diagnosis Date  . ALLERGIC RHINITIS 05/01/2007  . Anxiety   . Arthritis    "hands" (12/28/2015)  . ASTHMA 05/01/2007   "since I was a child"  . Cancer (HCC)    LUNG  . CHF (congestive heart failure) ([place])   . Chronic bronchitis ([place])   . COPD (chronic obstructive pulmonary disease) ([place])   . DEPRESSION 07/21/2008  . DVT (deep venous thrombosis) (HCC) 1960s   LLE  . FATIGUE 10/10/2010  . HYPERTENSION 05/01/2007  . HYPERTHYROIDISM 11/23/2007   Pt endorses having had Graves disease, possibly radioactive iodine x1, but no thyroidectomy  . HYPOTHYROIDISM 08/23/2009  . LUMBAR RADICULOPATHY, RIGHT 08/25/2008  . On home oxygen therapy    "3-4L; qd; all the time" (12/28/2015)  . OSTEOPOROSIS 05/01/2007  . SHOULDER PAIN, LEFT 08/23/2009  . SINUSITIS, CHRONIC 10/10/2010     Patient Active Problem List   Diagnosis Date Noted  . Acute encephalopathy 03/27/2018  . Respiratory failure with hypercapnia ([place]) 03/24/2018  . Acute on chronic respiratory failure with hypercapnia ([place]) 03/23/2018  . Hypercarbia 03/22/2018  . Dehydration 03/22/2018  . Cerumen impaction 02/18/2018  . Polysubstance abuse ([place]) 01/26/2018  . Acute on chronic respiratory failure ([place]) 01/07/2018  . Aspiration into airway 06/03/2017  . Rash 07/12/2016  . Bilateral hearing loss 06/05/2016  . Hyperglycemia 05/21/2016  . Abnormal LFTs 03/24/2016  . Elevated blood sugar 03/24/2016  . Solitary pulmonary nodule 01/09/2016  . Chronic respiratory failure with hypoxia ([place])   . On home oxygen therapy   . Chronic diastolic congestive heart failure ([place])   . Bacteremia Step viridans 12/27/2015  . Tobacco abuse disorder 12/25/2015  . Hypothyroidism following radioiodine therapy 09/26/2014  . Protein-calorie malnutrition, severe ([place]) 06/23/2014  . Cor pulmonale ([place]) 01/12/2013  . COPD (chronic obstructive pulmonary disease) ([place]) 11/29/2011  . Macrocytosis without anemia 11/09/2011  . SINUSITIS, CHRONIC 10/10/2010  . FATIGUE 10/10/2010  . Depression with anxiety 07/21/2008  . Essential hypertension 05/01/2007  . ALLERGIC RHINITIS 05/01/2007  . ASTHMA 05/01/2007  . OSTEOPOROSIS 05/01/2007    Past Surgical History:  Procedure Laterality Date  . APPENDECTOMY    . DILATION AND CURETTAGE OF UTERUS  multiple   history of multiple dialations and curettages and miscarriages, unfortunately never carrying a child to term  . ECTOPIC PREGNANCY SURGERY  "  late '60s or early '70s  . ELECTROCARDIOGRAM  06/20/2006  . OOPHORECTOMY Right      OB History    Gravida  5   Para      Term      Preterm      AB  5   Living        SAB  4   TAB      Ectopic  1   Multiple      Live Births               Home Medications    Prior to Admission medications   Medication Sig Start  Date End Date Taking? Authorizing Provider  albuterol (PROAIR HFA) 108 (90 Base) MCG/ACT inhaler Inhale 1-2 puffs into the lungs every 6 (six) hours as needed for wheezing or shortness of breath. 12/17/17   Rigoberto Noel, MD  albuterol (PROVENTIL) (2.5 MG/3ML) 0.083% nebulizer solution Take 3 mLs (2.5 mg total) by nebulization every 4 (four) hours as needed for wheezing. Patient not taking: Reported on 03/24/2018 06/03/17   Biagio Borg, MD  ALPRAZolam Duanne Moron) 0.25 MG tablet Take 1 tablet (0.25 mg total) by mouth 2 (two) times daily as needed for anxiety. 03/26/18   Hosie Poisson, MD  aspirin EC 81 MG EC tablet Take 1 tablet (81 mg total) by mouth daily. 03/27/16   Velvet Bathe, MD  citalopram (CELEXA) 20 MG tablet TAKE 1 TABLET BY MOUTH DAILY Patient taking differently: Take 20 mg by mouth once a day 10/21/17   Biagio Borg, MD  feeding supplement, ENSURE ENLIVE, (ENSURE ENLIVE) LIQD Take 237 mLs by mouth 3 (three) times daily between meals. 12/31/15   Florencia Reasons, MD  fluticasone furoate-vilanterol (BREO ELLIPTA) 200-25 MCG/INH AEPB Inhale 1 puff into the lungs daily. 12/17/17   Rigoberto Noel, MD  furosemide (LASIX) 40 MG tablet Take 1 tablet (40 mg total) by mouth daily. Patient taking differently: Take 40 mg by mouth 2 (two) times daily.  03/23/18 03/23/19  Debbe Odea, MD  ipratropium-albuterol (DUONEB) 0.5-2.5 (3) MG/3ML SOLN Take 3 mLs by nebulization every 6 (six) hours as needed. 03/26/18   Hosie Poisson, MD  levothyroxine (SYNTHROID, LEVOTHROID) 75 MCG tablet TAKE 1 TABLET BY MOUTH DAILY BEFORE BREAKFAST. 03/19/18   Biagio Borg, MD  OXYGEN Inhale 4 L into the lungs continuous.     [provider]  potassium chloride (K-DUR) 10 MEQ tablet Take 1 tablet (10 mEq total) by mouth 2 (two) times daily. 06/03/17   Biagio Borg, MD  tiotropium (SPIRIVA HANDIHALER) 18 MCG inhalation capsule Place 1 capsule (18 mcg total) into inhaler and inhale daily. 12/17/17   Rigoberto Noel, MD    Family  History Family History  Problem Relation Age of Onset  . Lung cancer Father   . Alcohol abuse Brother   . Diabetes Brother   . Hypertension Other   . Stroke Mother   . Thyroid disease Mother   . Asthma Unknown        maternal aunts    Social History Social History   Tobacco Use  . Smoking status: Former Smoker    Packs/day: 1.00    Years: 54.00    Pack years: 54.00    Types: Cigarettes    Last attempt to quit: 07/21/2015    Years since quitting: 2.6  . Smokeless tobacco: Never Used  Substance Use Topics  . Alcohol use: Yes    Comment: 1-4 beers per  month   . Drug use: Yes    Frequency: 2.0 times per week    Types: "Crack" cocaine    Comment: 12/28/2015 "sometimes weekends and holidays"     Allergies   Alendronate sodium   Review of Systems Review of Systems  Constitutional: Positive for activity change (increased sleep). Negative for chills and fever.  HENT: Negative for ear pain and sore throat.   Eyes: Negative for pain and visual disturbance.  Respiratory: Positive for shortness of breath. Negative for cough.   Cardiovascular: Negative for chest pain and palpitations.  Gastrointestinal: Negative for abdominal pain and vomiting.  Musculoskeletal: Negative for arthralgias and back pain.  Skin: Negative for color change and rash.  Neurological: Negative for seizures and syncope.  Psychiatric/Behavioral: Positive for confusion.  All other systems reviewed and are negative.    Physical Exam Updated Vital Signs BP 127/70   Pulse 79   Temp 98.6 F (37 C) (Oral)   Resp 11   Ht 5' (1.524 m)   Wt 54.4 kg (120 lb)   SpO2 93%   BMI 23.44 kg/m   Physical Exam  Constitutional: She is oriented to person, place, and time. She appears well-developed and well-nourished. No distress.  HENT:  Head: Normocephalic and atraumatic.  [place] in place  Eyes: Conjunctivae and EOM are normal.  Neck: Neck supple.  Cardiovascular: Normal rate and regular rhythm.  No murmur  heard. Pulmonary/Chest: No tachypnea. No respiratory distress. She has decreased breath sounds.  Decreased breath sounds throughout with expiratory wheeze  Abdominal: Soft. There is no tenderness.  Musculoskeletal: She exhibits no edema.       Right lower leg: She exhibits no edema.       Left lower leg: She exhibits no edema.  Neurological: She is alert and oriented to person, place, and time.  Slow to respond, but alert, oriented to person, place and time (with prompting)  Skin: Skin is warm and dry.  Psychiatric: She has a normal mood and affect.  Nursing note and vitals reviewed.    ED Treatments / Results  Labs (all labs ordered are listed, but only abnormal results are displayed) Labs Reviewed  CBC WITH DIFFERENTIAL/PLATELET - Abnormal; Notable for the following components:      Result Value   WBC 3.8 (*)    MCV 105.9 (*)    MCHC 28.8 (*)    All other components within normal limits  BASIC METABOLIC PANEL - Abnormal; Notable for the following components:   Chloride 92 (*)    CO2 42 (*)    Glucose, Bld 114 (*)    BUN <5 (*)    All other components within normal limits  BLOOD GAS, VENOUS - Abnormal; Notable for the following components:   pCO2, Ven 89.2 (*)    Bicarbonate 45.2 (*)    Acid-Base Excess 18.4 (*)    All other components within normal limits  AMMONIA - Abnormal; Notable for the following components:   Ammonia 47 (*)    All other components within normal limits  CULTURE, EXPECTORATED SPUTUM-ASSESSMENT  BRAIN NATRIURETIC PEPTIDE  RAPID URINE DRUG SCREEN, HOSP PERFORMED  BASIC METABOLIC PANEL  VITAMIN B71  FOLATE RBC  RPR  HIV ANTIBODY (ROUTINE TESTING)  BLOOD GAS, ARTERIAL  I-STAT TROPONIN, ED    EKG EKG Interpretation  Date/Time:  Saturday March 27 2018 20:32:17 EDT Ventricular Rate:  84 PR Interval:    QRS Duration: 83 QT Interval:  329 QTC Calculation: 389 R Axis:  85 Text Interpretation:  Sinus rhythm Biatrial enlargement Anteroseptal  infarct, old Nonspecific T abnormalities, lateral leads Confirmed by Pattricia Boss [phone]) on 03/27/2018 9:45:07 PM   Radiology Dg Chest Port 1 View  Result Date: 03/27/2018 CLINICAL DATA:  Acute onset of shortness of breath and generalized weakness. Decreased oral intake. EXAM: PORTABLE CHEST 1 VIEW COMPARISON:  Chest radiograph performed 03/24/2018 FINDINGS: The lungs are well-aerated. Mild chronic peribronchial thickening is noted. There is no evidence of pleural effusion or pneumothorax. Scarring is again noted at the right upper lobe. The cardiomediastinal silhouette is within normal limits. No acute osseous abnormalities are seen. IMPRESSION: Mild chronic peribronchial thickening noted. Scarring again noted at the right upper lobe. Lungs otherwise grossly clear. Electronically Signed   By: Garald Balding M.D.   On: 03/27/2018 21:34    Procedures Procedures (including critical care time)  Medications Ordered in ED Medications  albuterol (PROVENTIL) (2.5 MG/3ML) 0.083% nebulizer solution 2.5 mg (has no administration in time range)  ALPRAZolam (XANAX) tablet 0.25 mg (has no administration in time range)  aspirin EC tablet 81 mg (has no administration in time range)  citalopram (CELEXA) tablet 20 mg (has no administration in time range)  feeding supplement (ENSURE ENLIVE) (ENSURE ENLIVE) liquid 237 mL (has no administration in time range)  fluticasone furoate-vilanterol (BREO ELLIPTA) 200-25 MCG/INH 1 puff (has no administration in time range)  furosemide (LASIX) tablet 40 mg (has no administration in time range)  ipratropium-albuterol (DUONEB) 0.5-2.5 (3) MG/3ML nebulizer solution 3 mL (has no administration in time range)  levothyroxine (SYNTHROID, LEVOTHROID) tablet 75 mcg (has no administration in time range)  enoxaparin (LOVENOX) injection 40 mg (has no administration in time range)  sodium chloride flush (NS) 0.9 % injection 3 mL (3 mLs Intravenous Not Given 03/27/18 2342)  sodium  chloride flush (NS) 0.9 % injection 3 mL (3 mLs Intravenous Not Given 03/27/18 2342)  sodium chloride flush (NS) 0.9 % injection 3 mL (has no administration in time range)  0.9 %  sodium chloride infusion (has no administration in time range)  acetaminophen (TYLENOL) tablet 650 mg (has no administration in time range)    Or  acetaminophen (TYLENOL) suppository 650 mg (has no administration in time range)  traMADol (ULTRAM) tablet 50 mg (has no administration in time range)  senna-docusate (Senokot-S) tablet 1 tablet (has no administration in time range)  ondansetron (ZOFRAN) tablet 4 mg (has no administration in time range)    Or  ondansetron (ZOFRAN) injection 4 mg (has no administration in time range)     Initial Impression / Assessment and Plan / ED Course  I have reviewed the triage vital signs and the nursing notes.  Pertinent labs & imaging results that were available during my care of the patient were reviewed by me and considered in my medical decision making (see chart for details).     Jasmine [name] is a 72 y.o. female with PMHx of COPD (reported on 4L [place]), HF who presents back to ED after d/c from hospital yesterday for continued confusion, increased fatigue, dyspnea. Reviewed and confirmed nursing documentation for past medical history, family history, social history. VS afebrile, SpO2 1000% on 3L [place]. Exam remarkable for decreased breaths throughout with no respiratory distress, expiratory wheezing. No focal neuro deficits to suggest CVA, though patient is slow to respond to questions. Concern for hypercapnia given clinical picture. satting 100% on 3L, possible increased oxygenation is contributing to decreased respiratory drive. Will obtain UDS given substance abuse history and known  xanax use. Possible COPD exacerbation.  Possible HF exacerbation.  EKG as above with no acute changes. Trop neg. BNP neg. BMP with stable elevated bicarb 42, otherwise wnl. CXR with no acute findings.  Still with decreased breath sounds throughout after Duonebs but improved wheezing. VBG c/w respiratory acidosis, pH 7.308, pCO2 91. Placed on BiPAP. On reexam, patient more awake and alert.   Old records reviewed. Labs reviewed by me and used in the medical decision making.  Imaging viewed and interpreted by me and used in the medical decision making (formal interpretation from radiologist). EKG reviewed by me and used in the medical decision making. Admitted to hospitalist.    Final Clinical Impressions(s) / ED Diagnoses   Final diagnoses:  Acute on chronic respiratory failure with hypercapnia [facility])    ED Discharge Orders    None       Norm Salt, MD 03/28/18 0981    Pattricia Boss, MD 03/28/18 1527

## 2018-03-27 NOTE — ED Notes (Signed)
Please call niece with any questions:  Willene Hatchet @ [phone]

## 2018-03-27 NOTE — ED Notes (Signed)
Please call niece with any questions:  Willene Hatchet @ [phone]

## 2018-03-27 NOTE — H&P (Signed)
History and Physical    Jasmine [name] QIO:[id number] DOB: January 12, 1946 DOA: 03/27/2018  PCP: Biagio Borg, MD   Patient coming from: Home  Chief Complaint: Confusion, somnolence   HPI: Jasmine [name] is a 72 y.o. female with medical history significant for COPD with chronic hypoxic/hypercarbic respiratory failure, chronic diastolic CHF, cocaine abuse, hypothyroidism, and anxiety, now presenting to the emergency department for evaluation of confusion and somnolence.  Patient was just discharged yesterday after her second admission in the past week under similar circumstances.  She has been presenting to the emergency department with confusion, found to be hypercarbic, mental status clears with BiPAP, and she has then been returning home.  Family reports that she has been sleeping more recently and has again been "talking out of her head" since the recent hospital discharge per report of family.  Patient is able to state the month, but not year, recognizes that she is in the hospital, denies any headache, change in vision or hearing, or focal numbness or weakness.  She also denies chest pain or palpitations.  Patient denies feeling short of breath and also denies any change in her chronic cough.  She denies recent illicit substance use.  ED Course: Upon arrival to the ED, patient is found to be afebrile, saturating mid 90s on her usual supplemental oxygen, and with vitals otherwise normal.  EKG features a sinus rhythm and chest x-ray features chronic mild peribronchial thickening and right upper lobe scar, but lungs are otherwise clear.  CBC features a slight leukopenia and macrocytosis without anemia.  Serum bicarbonate is 42, similar to priors.  VBG reveals a normal pH with pCO2 of 89.  UDS was ordered but not yet performed and the patient was started on BiPAP.  She will be admitted for ongoing evaluation and management.  Review of Systems:  All other systems reviewed and apart from HPI, are  negative.  Past Medical History:  Diagnosis Date  . ALLERGIC RHINITIS 05/01/2007  . Anxiety   . Arthritis    "hands" (12/28/2015)  . ASTHMA 05/01/2007   "since I was a child"  . Cancer (HCC)    LUNG  . CHF (congestive heart failure) ([place])   . Chronic bronchitis ([place])   . COPD (chronic obstructive pulmonary disease) ([place])   . DEPRESSION 07/21/2008  . DVT (deep venous thrombosis) (HCC) 1960s   LLE  . FATIGUE 10/10/2010  . HYPERTENSION 05/01/2007  . HYPERTHYROIDISM 11/23/2007   Pt endorses having had Graves disease, possibly radioactive iodine x1, but no thyroidectomy  . HYPOTHYROIDISM 08/23/2009  . LUMBAR RADICULOPATHY, RIGHT 08/25/2008  . On home oxygen therapy    "3-4L; qd; all the time" (12/28/2015)  . OSTEOPOROSIS 05/01/2007  . SHOULDER PAIN, LEFT 08/23/2009  . SINUSITIS, CHRONIC 10/10/2010    Past Surgical History:  Procedure Laterality Date  . APPENDECTOMY    . DILATION AND CURETTAGE OF UTERUS  multiple   history of multiple dialations and curettages and miscarriages, unfortunately never carrying a child to term  . ECTOPIC PREGNANCY SURGERY  "late '60s or early '70s  . ELECTROCARDIOGRAM  06/20/2006  . OOPHORECTOMY Right      reports that she quit smoking about 2 years ago. Her smoking use included cigarettes. She has a 54.00 pack-year smoking history. She has never used smokeless tobacco. She reports that she drinks alcohol. She reports that she has current or past drug history. Drug: "Crack" cocaine. Frequency: 2.00 times per week.  Allergies  Allergen Reactions  . Alendronate  Sodium Other (See Comments)    Patient does not remember this reaction    Family History  Problem Relation Age of Onset  . Lung cancer Father   . Alcohol abuse Brother   . Diabetes Brother   . Hypertension Other   . Stroke Mother   . Thyroid disease Mother   . Asthma Unknown        maternal aunts     Prior to Admission medications   Medication Sig Start Date End Date Taking? Authorizing  Provider  albuterol (PROAIR HFA) 108 (90 Base) MCG/ACT inhaler Inhale 1-2 puffs into the lungs every 6 (six) hours as needed for wheezing or shortness of breath. 12/17/17   Rigoberto Noel, MD  albuterol (PROVENTIL) (2.5 MG/3ML) 0.083% nebulizer solution Take 3 mLs (2.5 mg total) by nebulization every 4 (four) hours as needed for wheezing. Patient not taking: Reported on 03/24/2018 06/03/17   Biagio Borg, MD  ALPRAZolam Duanne Moron) 0.25 MG tablet Take 1 tablet (0.25 mg total) by mouth 2 (two) times daily as needed for anxiety. 03/26/18   Hosie Poisson, MD  aspirin EC 81 MG EC tablet Take 1 tablet (81 mg total) by mouth daily. 03/27/16   Velvet Bathe, MD  citalopram (CELEXA) 20 MG tablet TAKE 1 TABLET BY MOUTH DAILY Patient taking differently: Take 20 mg by mouth once a day 10/21/17   Biagio Borg, MD  feeding supplement, ENSURE ENLIVE, (ENSURE ENLIVE) LIQD Take 237 mLs by mouth 3 (three) times daily between meals. 12/31/15   Florencia Reasons, MD  fluticasone furoate-vilanterol (BREO ELLIPTA) 200-25 MCG/INH AEPB Inhale 1 puff into the lungs daily. 12/17/17   Rigoberto Noel, MD  furosemide (LASIX) 40 MG tablet Take 1 tablet (40 mg total) by mouth daily. Patient taking differently: Take 40 mg by mouth 2 (two) times daily.  03/23/18 03/23/19  Debbe Odea, MD  ipratropium-albuterol (DUONEB) 0.5-2.5 (3) MG/3ML SOLN Take 3 mLs by nebulization every 6 (six) hours as needed. 03/26/18   Hosie Poisson, MD  levothyroxine (SYNTHROID, LEVOTHROID) 75 MCG tablet TAKE 1 TABLET BY MOUTH DAILY BEFORE BREAKFAST. 03/19/18   Biagio Borg, MD  OXYGEN Inhale 4 L into the lungs continuous.     [provider]  potassium chloride (K-DUR) 10 MEQ tablet Take 1 tablet (10 mEq total) by mouth 2 (two) times daily. 06/03/17   Biagio Borg, MD  tiotropium (SPIRIVA HANDIHALER) 18 MCG inhalation capsule Place 1 capsule (18 mcg total) into inhaler and inhale daily. 12/17/17   Rigoberto Noel, MD    Physical Exam: Vitals:   03/27/18 2130 03/27/18  2145 03/27/18 2147 03/27/18 2200  BP: (!) 112/56 122/64  114/60  Pulse: 70 77 77   Resp: 16 19 19 20   Temp:      TempSrc:      SpO2: 100% 97% 96%   Weight:      Height:          Constitutional: NAD, calm  Eyes: PERTLA, lids and conjunctivae normal ENMT: Mucous membranes are moist. Posterior pharynx clear of any exudate or lesions.   Neck: normal, supple, no masses, no thyromegaly Respiratory: Breath sounds diminished bilaterally, no wheezing, no crackles. No accessory muscle use.  Cardiovascular: S1 & S2 heard, regular rate and rhythm. No extremity edema. No significant JVD. Abdomen: No distension, no tenderness, soft. Bowel sounds active.  Musculoskeletal: no clubbing / cyanosis. No joint deformity upper and lower extremities.   Skin: no significant rashes, lesions, ulcers. Warm, dry,  well-perfused. Neurologic: No facial asymmetry. No dysarthria. Sensation to light touch intact. Strength 5/5 in all 4 limbs.  Psychiatric: Alert and oriented to person and place, not oriented to year. Calm, cooperative.     Labs on Admission: I have personally reviewed following labs and imaging studies  CBC: Recent Labs  Lab 03/22/18 1400 03/24/18 1612 03/24/18 1620 03/25/18 0535 03/27/18 2102  WBC 4.6 4.6  --  5.0 3.8*  NEUTROABS  --  3.0  --   --  2.0  HGB 14.0 15.0 17.7* 11.6* 12.4  HCT 48.0* 52.0* 52.0* 40.9 43.0  MCV 107.1* 105.3*  --  106.8* 105.9*  PLT 155 156  --  150 093   Basic Metabolic Panel: Recent Labs  Lab 03/22/18 1400 03/24/18 1612 03/24/18 1620 03/25/18 0535 03/26/18 1039 03/27/18 2102  NA 142 142 139 147* 141 140  K 4.3 5.1 4.9 4.0 3.7 5.1  CL 84* 88* 90* 95* 93* 92*  CO2 39* 41*  --  41* 41* 42*  GLUCOSE 115* 105* 103* 88 171* 114*  BUN 11 11 16 10 7  <5*  CREATININE 0.62 0.58 0.70 0.71 0.53 0.63  CALCIUM 9.8 9.7  --  9.4 9.0 9.2  MG  --   --   --  1.3*  --   --   PHOS  --   --   --  1.8*  --   --    GFR: Estimated Creatinine Clearance: 45 mL/min (by  C-G formula based on SCr of 0.63 mg/dL). Liver Function Tests: Recent Labs  Lab 03/22/18 1400 03/24/18 1612 03/25/18 0535  AST 24 27 17   ALT 12* 10* 9*  ALKPHOS 56 54 43  BILITOT 1.2 1.0 0.9  PROT 8.1 8.2* 6.3*  ALBUMIN 4.4 4.4 3.3*   No results for input(s): LIPASE, AMYLASE in the last 168 hours. Recent Labs  Lab 03/24/18 1612  AMMONIA 56*   Coagulation Profile: No results for input(s): INR, PROTIME in the last 168 hours. Cardiac Enzymes: Recent Labs  Lab 03/24/18 2116 03/25/18 0010 03/25/18 0535  TROPONINI <0.03 <0.03 <0.03   BNP (last 3 results) No results for input(s): PROBNP in the last 8760 hours. HbA1C: No results for input(s): HGBA1C in the last 72 hours. CBG: Recent Labs  Lab 03/24/18 1618  GLUCAP 107*   Lipid Profile: No results for input(s): CHOL, HDL, LDLCALC, TRIG, CHOLHDL, LDLDIRECT in the last 72 hours. Thyroid Function Tests: Recent Labs    03/25/18 0010  TSH 0.378   Anemia Panel: No results for input(s): VITAMINB12, FOLATE, FERRITIN, TIBC, IRON, RETICCTPCT in the last 72 hours. Urine analysis:    Component Value Date/Time   COLORURINE YELLOW 03/24/2018 1925   APPEARANCEUR HAZY (A) 03/24/2018 1925   LABSPEC 1.024 03/24/2018 1925   PHURINE 6.0 03/24/2018 1925   GLUCOSEU NEGATIVE 03/24/2018 1925   GLUCOSEU NEGATIVE 01/11/2015 1723   HGBUR NEGATIVE 03/24/2018 1925   BILIRUBINUR NEGATIVE 03/24/2018 1925   BILIRUBINUR negative 08/11/2011 1358   KETONESUR 20 (A) 03/24/2018 1925   PROTEINUR 30 (A) 03/24/2018 1925   UROBILINOGEN 0.2 01/11/2015 1723   NITRITE NEGATIVE 03/24/2018 1925   LEUKOCYTESUR MODERATE (A) 03/24/2018 1925   Sepsis Labs: @LABRCNTIP (procalcitonin:4,lacticidven:4) ) Recent Results (from the past 240 hour(s))  MRSA PCR Screening     Status: None   Collection Time: 03/22/18  8:32 PM  Result Value Ref Range Status   MRSA by PCR NEGATIVE NEGATIVE Final    Comment:        The  GeneXpert MRSA Assay (FDA approved for  NASAL specimens only), is one component of a comprehensive MRSA colonization surveillance program. It is not intended to diagnose MRSA infection nor to guide or monitor treatment for MRSA infections. Performed at [facility], [place] [address], [place], [place] [address]   Urine culture     Status: None   Collection Time: 03/24/18  7:25 PM  Result Value Ref Range Status   Specimen Description URINE, RANDOM  Final   Special Requests NONE  Final   Culture   Final    NO GROWTH Performed at [facility], [place] [address], [place], [place] [address]    Report Status 03/25/2018 FINAL  Final  MRSA PCR Screening     Status: None   Collection Time: 03/24/18 11:03 PM  Result Value Ref Range Status   MRSA by PCR NEGATIVE NEGATIVE Final    Comment:        The GeneXpert MRSA Assay (FDA approved for NASAL specimens only), is one component of a comprehensive MRSA colonization surveillance program. It is not intended to diagnose MRSA infection nor to guide or monitor treatment for MRSA infections. Performed at [facility], [place] [address], [place],  [address]      Radiological Exams on Admission: Dg Chest Port 1 View  Result Date: 03/27/2018 CLINICAL DATA:  Acute onset of shortness of breath and generalized weakness. Decreased oral intake. EXAM: PORTABLE CHEST 1 VIEW COMPARISON:  Chest radiograph performed 03/24/2018 FINDINGS: The lungs are well-aerated. Mild chronic peribronchial thickening is noted. There is no evidence of pleural effusion or pneumothorax. Scarring is again noted at the right upper lobe. The cardiomediastinal silhouette is within normal limits. No acute osseous abnormalities are seen. IMPRESSION: Mild chronic peribronchial thickening noted. Scarring again noted at the right upper lobe. Lungs otherwise grossly clear. Electronically Signed   By: Garald Balding M.D.   On: 03/27/2018 21:34    EKG: Independently reviewed. Sinus rhythm.    Assessment/Plan   1. Acute encephalopathy  - Presents with confusion; was just discharged the day before and has been admitted twice already in past week for the same, suspected secondary to hypercarbia and resolves with short-course of BiPAP   - There had been concern for Xanax or other substance abuse as etiology  - She had negative head CT on 6/5, normal TSH on 6/6 - Ammonia had been elevated to 56 on 6/5, not treated, but mental status cleared with BiPAP at that time  - DDx is broad; this could certainly be a primary respiratory issue and secondary to hypercarbia, in which case she may benefit from nocturnal NIPPV  - Repeat ammonia level, check B12, folate, RPR  - Continue BiPAP and repeat blood gas in a few hours    2. Acute on chronic hypercarbic respiratory failure  - Presents with confusion, possibly secondary to hypercarbia with pCO2 89 on VBG    - Started on BiPAP in ED, will continue for now   - Transition back to her usual 3 Lpm nasal canula as tolerated, titrate FiO2 to keep sat 88-92% in light of chronic hypercarbia    3. COPD  - No wheezing or dyspnea on admission  - Started on BiPAP for hypercarbia that may be responsible for her confusion  - Continue ICS/LABA, schedule DuoNebs, continue prn albuterol    4. Chronic diastolic CHF  - Appears euvolemic  - Continue Lasix and daily wts    5. Hypothyroidism  -  TSH was normal on 03/25/18  - Continue Synthroid    6. Anxiety  - Stable   - Continue Celexa and prn Xanax    7. Polysubstance abuse  - Denies recent use, UDS pending     DVT prophylaxis: Lovenox Code Status: Full  Family Communication: Discussed with patient Consults called: None Admission status: Inpatient    Vianne Bulls, MD [facility] Pager [phone]  If 7PM-7AM, please contact night-coverage [email or web address] Password [email or web address]  03/27/2018, 10:43 PM

## 2018-03-28 ENCOUNTER — Other Ambulatory Visit: Payer: Self-pay

## 2018-03-28 LAB — BASIC METABOLIC PANEL
ANION GAP: 6 (ref 5–15)
BUN: <5 mg/dL — ABNORMAL LOW (ref 6–20)
CALCIUM: 9.2 mg/dL (ref 8.9–10.3)
CO2: 43 mmol/L — AB (ref 22–32)
Chloride: 92 mmol/L — ABNORMAL LOW (ref 101–111)
Creatinine, Ser: 0.54 mg/dL (ref 0.44–1.00)
GFR calc Af Amer: >60 mL/min (ref >60)
Glucose, Bld: 141 mg/dL — ABNORMAL HIGH (ref 65–99)
Potassium: 3.9 mmol/L (ref 3.5–5.1)
Sodium: 141 mmol/L (ref 135–145)

## 2018-03-28 LAB — I-STAT ARTERIAL BLOOD GAS, ED
ACID-BASE EXCESS: 18 mmol/L — AB (ref 0.0–2.0)
BICARBONATE: 47.2 mmol/L — AB (ref 20.0–28.0)
O2 SAT: 94 %
PO2 ART: 79 mmHg — AB (ref 83.0–108.0)
Patient temperature: 98.6
TCO2: 50 mmol/L — ABNORMAL HIGH (ref 22–32)
pCO2 arterial: 85.4 mmHg (ref 32.0–48.0)
pH, Arterial: 7.351 (ref 7.350–7.450)

## 2018-03-28 LAB — VITAMIN B12: VITAMIN B 12: 571 pg/mL (ref 180–914)

## 2018-03-28 LAB — AMMONIA: Ammonia: 47 umol/L — ABNORMAL HIGH (ref 9–35)

## 2018-03-28 LAB — RPR: RPR: NONREACTIVE

## 2018-03-28 LAB — HIV ANTIBODY (ROUTINE TESTING W REFLEX): HIV Screen 4th Generation wRfx: NONREACTIVE

## 2018-03-28 MED ORDER — HALOPERIDOL LACTATE 5 MG/ML IJ SOLN
3.0000 mg | Freq: Once | INTRAMUSCULAR | Status: AC
  Administered 2018-03-28: 3 mg via INTRAVENOUS
  Filled 2018-03-28: qty 1

## 2018-03-28 MED ORDER — IPRATROPIUM-ALBUTEROL 0.5-2.5 (3) MG/3ML IN SOLN
3.0000 mL | Freq: Two times a day (BID) | RESPIRATORY_TRACT | Status: DC
  Administered 2018-03-28 – 2018-04-02 (×10): 3 mL via RESPIRATORY_TRACT
  Filled 2018-03-28 (×10): qty 3

## 2018-03-28 MED ORDER — HALOPERIDOL LACTATE 5 MG/ML IJ SOLN: 3.0000 mg | Freq: Four times a day (QID) | INTRAMUSCULAR | Status: DC | PRN

## 2018-03-28 MED ORDER — ALBUTEROL SULFATE (2.5 MG/3ML) 0.083% IN NEBU: 2.5000 mg | INHALATION_SOLUTION | RESPIRATORY_TRACT | Status: DC | PRN

## 2018-03-28 NOTE — ED Notes (Signed)
[place]

## 2018-03-28 NOTE — ED Notes (Signed)
Attempted report 

## 2018-03-28 NOTE — Progress Notes (Signed)
[facility] TEAM 1 - Stepdown/ICU TEAM  Jasmine [name]  OIZ:[id number] DOB: [birth year] DOA: 03/27/2018 PCP: Biagio Borg, MD    Brief Narrative:  72 y.o. female with a hx of COPD, diastolic CHF, cocaine abuse, hypothyroidism, and anxiety who presented w/ confusion and somnolence.  Patient was discharged the day prior after her second admission in the past week under similar circumstances (this now being her third identical admission this month).  She has been presenting to the ED with confusion, is found to be hypercarbic, and then mental status clears with BiPAP.    In the ED an acute infection w/u was negative, and a VBG revealed a normal pH with a pCO2 of 89.   Subjective: The patient is awake and conversant.  She denies chest pain shortness breath fevers chills nausea or vomiting.  Her memory is foggy concerning the events of the last 48 hours.  Assessment & Plan:  Acute encephalopathy  secondary to hypercarbia either related to untreated sleep apnea versus benzodiazepine abuse in the setting of severe COPD versus both - negative head CT 6/5 - normal TSH 6/6 - follow without BiPAP in hospital while also avoiding sedating medications  Acute on chronic hypercarbic and hypoxic respiratory failure - COPD  See discussion above  Chronic grade 1 diastolic CHF  No volume overload on exam Filed Weights   03/27/18 2042  Weight: 54.4 kg (120 lb)    Hypothyroidism  TSH was normal 03/25/18 - continue Synthroid    Anxiety  continue Celexa - stop Benzodiazepines for now and follow    Polysubstance abuse  Denies recent use - UDS + for cocaine March 2019  Macrocytosis  ?EtOH abuse - B12 is normal - Folate is pending   DVT prophylaxis: Lovenox Code Status: FULL CODE Family Communication: no family present at time of exam  Disposition Plan: SDU due to possible acute need for BIPAP   Consultants:  none  Antimicrobials:  none   Objective: Blood pressure 112/66, pulse 89,  temperature 99 F (37.2 C), temperature source Oral, resp. rate 16, height 5' (1.524 m), weight 54.4 kg (120 lb), SpO2 97 %.  Intake/Output Summary (Last 24 hours) at 03/28/2018 1427 Last data filed at 03/28/2018 0930 Gross per 24 hour  Intake 360 ml  Output -  Net 360 ml   Filed Weights   03/27/18 2042  Weight: 54.4 kg (120 lb)    Examination: General: No acute respiratory distress Lungs: Clear to auscultation bilaterally without wheezes or crackles - very distant breath sounds on auscultation Cardiovascular: Regular rate and rhythm without murmur gallop or rub normal S1 and S2 Abdomen: Nontender, nondistended, soft, bowel sounds positive, no rebound, no ascites, no appreciable mass Extremities: No significant cyanosis, clubbing, or edema bilateral lower extremities  CBC: Recent Labs  Lab 03/24/18 1612 03/24/18 1620 03/25/18 0535 03/27/18 2102  WBC 4.6  --  5.0 3.8*  NEUTROABS 3.0  --   --  2.0  HGB 15.0 17.7* 11.6* 12.4  HCT 52.0* 52.0* 40.9 43.0  MCV 105.3*  --  106.8* 105.9*  PLT 156  --  150 338   Basic Metabolic Panel: Recent Labs  Lab 03/25/18 0535 03/26/18 1039 03/27/18 2102 03/28/18 0336  NA 147* 141 140 141  K 4.0 3.7 5.1 3.9  CL 95* 93* 92* 92*  CO2 41* 41* 42* 43*  GLUCOSE 88 171* 114* 141*  BUN 10 7 <5* <5*  CREATININE 0.71 0.53 0.63 0.54  CALCIUM 9.4 9.0 9.2 9.2  MG 1.3*  --   --   --   PHOS 1.8*  --   --   --    GFR: Estimated Creatinine Clearance: 45 mL/min (by C-G formula based on SCr of 0.54 mg/dL).  Liver Function Tests: Recent Labs  Lab 03/22/18 1400 03/24/18 1612 03/25/18 0535  AST 24 27 17   ALT 12* 10* 9*  ALKPHOS 56 54 43  BILITOT 1.2 1.0 0.9  PROT 8.1 8.2* 6.3*  ALBUMIN 4.4 4.4 3.3*    Recent Labs  Lab 03/24/18 1612 03/28/18 0004  AMMONIA 56* 47*    Cardiac Enzymes: Recent Labs  Lab 03/24/18 2116 03/25/18 0010 03/25/18 0535  TROPONINI <0.03 <0.03 <0.03    HbA1C: Hgb A1c MFr Bld  Date/Time Value Ref Range  Status  06/03/2017 04:22 PM 6.3 4.6 - 6.5 % Final    Comment:    Glycemic Control Guidelines for People with Diabetes:Non Diabetic:  <6%Goal of Therapy: <7%Additional Action Suggested:  >8%   05/21/2016 03:39 AM 6.2 (H) 4.8 - 5.6 % Final    Comment:    (NOTE)         Pre-diabetes: 5.7 - 6.4         Diabetes: >6.4         Glycemic control for adults with diabetes: <7.0     CBG: Recent Labs  Lab 03/24/18 1618  GLUCAP 107*    Recent Results (from the past 240 hour(s))  MRSA PCR Screening     Status: None   Collection Time: 03/22/18  8:32 PM  Result Value Ref Range Status   MRSA by PCR NEGATIVE NEGATIVE Final    Comment:        The GeneXpert MRSA Assay (FDA approved for NASAL specimens only), is one component of a comprehensive MRSA colonization surveillance program. It is not intended to diagnose MRSA infection nor to guide or monitor treatment for MRSA infections. Performed at [facility], [place] [address], [place], [place] [address]   Urine culture     Status: None   Collection Time: 03/24/18  7:25 PM  Result Value Ref Range Status   Specimen Description URINE, RANDOM  Final   Special Requests NONE  Final   Culture   Final    NO GROWTH Performed at [facility], [place] [address], [place], [place] [address]    Report Status 03/25/2018 FINAL  Final  MRSA PCR Screening     Status: None   Collection Time: 03/24/18 11:03 PM  Result Value Ref Range Status   MRSA by PCR NEGATIVE NEGATIVE Final    Comment:        The GeneXpert MRSA Assay (FDA approved for NASAL specimens only), is one component of a comprehensive MRSA colonization surveillance program. It is not intended to diagnose MRSA infection nor to guide or monitor treatment for MRSA infections. Performed at [facility], [place] [address], [place], [place] [address]      Scheduled Meds: . aspirin EC  81 mg Oral Daily  . citalopram  20 mg Oral Daily  . enoxaparin (LOVENOX)  injection  40 mg Subcutaneous Daily  . feeding supplement (ENSURE ENLIVE)  237 mL Oral TID BM  . fluticasone furoate-vilanterol  1 puff Inhalation Daily  . furosemide  40 mg Oral BID  . ipratropium-albuterol  3 mL Nebulization TID  . levothyroxine  75 mcg Oral QAC breakfast  . sodium chloride flush  3 mL Intravenous Q12H  . sodium chloride  flush  3 mL Intravenous Q12H     LOS: 1 day   Cherene Altes, MD [facility] Office  [phone] Pager - Text Page per Amion as per below:  On-Call/Text Page:      [email or web address]      password [email or web address]  If 7PM-7AM, please contact night-coverage [email or web address] Password [email or web address] 03/28/2018, 2:27 PM

## 2018-03-28 NOTE — Progress Notes (Signed)
Patient taken off BiPap after ABG results discussed with Dr Myna Hidalgo by Luretha Murphy RRT

## 2018-03-28 NOTE — Progress Notes (Signed)
Pt says wears 4L of O2 at home.

## 2018-03-29 ENCOUNTER — Encounter: Payer: Self-pay | Admitting: *Deleted

## 2018-03-29 ENCOUNTER — Telehealth: Payer: Self-pay

## 2018-03-29 LAB — BASIC METABOLIC PANEL
Anion gap: 8 (ref 5–15)
BUN: 6 mg/dL (ref 6–20)
CALCIUM: 9.4 mg/dL (ref 8.9–10.3)
CHLORIDE: 85 mmol/L — AB (ref 101–111)
CO2: 47 mmol/L — AB (ref 22–32)
CREATININE: 0.59 mg/dL (ref 0.44–1.00)
GFR calc Af Amer: >60 mL/min (ref >60)
GFR calc non Af Amer: >60 mL/min (ref >60)
GLUCOSE: 107 mg/dL — AB (ref 65–99)
Potassium: 4.1 mmol/L (ref 3.5–5.1)
Sodium: 140 mmol/L (ref 135–145)

## 2018-03-29 LAB — BLOOD GAS, ARTERIAL
ACID-BASE EXCESS: 20.9 mmol/L — AB (ref 0.0–2.0)
Bicarbonate: 47.2 mmol/L — ABNORMAL HIGH (ref 20.0–28.0)
DELIVERY SYSTEMS: POSITIVE
Drawn by: 517021
Expiratory PAP: 6
FIO2: 0.4
INSPIRATORY PAP: 14
O2 Saturation: 98.1 %
PCO2 ART: 79.4 mmHg — AB (ref 32.0–48.0)
Patient temperature: 98.6
pH, Arterial: 7.392 (ref 7.350–7.450)
pO2, Arterial: 101 mmHg (ref 83.0–108.0)

## 2018-03-29 LAB — HEPATIC FUNCTION PANEL
ALT: 11 U/L — ABNORMAL LOW (ref 14–54)
AST: 19 U/L (ref 15–41)
Albumin: 3.1 g/dL — ABNORMAL LOW (ref 3.5–5.0)
Alkaline Phosphatase: 42 U/L (ref 38–126)
TOTAL PROTEIN: 5.8 g/dL — AB (ref 6.5–8.1)
Total Bilirubin: 0.6 mg/dL (ref 0.3–1.2)

## 2018-03-29 LAB — FOLATE RBC
FOLATE, HEMOLYSATE: 305.6 ng/mL
Folate, RBC: 901 ng/mL (ref >498)
HEMATOCRIT: 33.9 % — AB (ref 34.0–46.6)

## 2018-03-29 LAB — HEMOGLOBIN A1C
Hgb A1c MFr Bld: 5.7 % — ABNORMAL HIGH (ref 4.8–5.6)
Mean Plasma Glucose: 116.89 mg/dL

## 2018-03-29 LAB — CBC
HCT: 37.5 % (ref 36.0–46.0)
Hemoglobin: 11.2 g/dL — ABNORMAL LOW (ref 12.0–15.0)
MCH: 30.9 pg (ref 26.0–34.0)
MCHC: 29.9 g/dL — AB (ref 30.0–36.0)
MCV: 103.3 fL — AB (ref 78.0–100.0)
PLATELETS: 162 10*3/uL (ref 150–400)
RBC: 3.63 MIL/uL — ABNORMAL LOW (ref 3.87–5.11)
RDW: 13.1 % (ref 11.5–15.5)
WBC: 5.6 10*3/uL (ref 4.0–10.5)

## 2018-03-29 LAB — AMMONIA: AMMONIA: 36 umol/L — AB (ref 9–35)

## 2018-03-29 NOTE — Progress Notes (Signed)
[facility]  Patient Status: Active (receiving services up to time of hospitalization)  [facility] is providing the following services: RN, PT, OT and MSW  Readmitted to Hospital prior to start of services.  If patient discharges after hours, please call [phone].   Janae Sauce 03/29/2018, 4:30 PM

## 2018-03-29 NOTE — Progress Notes (Signed)
  [place] TEAM 1 - Stepdown/ICU TEAM  Ms. Soland suffers with severe COPD and resultant hypercarbic respiratory failure.  She continues to exhibit refractory hypercapnia due to her chronic respiratory failure in the setting of severe COPD and has required multiple readmissions even over the course of the last 2 weeks due to failure to manage her hypercapnic respiratory failure at home.  She will require the use of noninvasive ventilation both each night while sleeping as well as during periods of napping in the day.  This is the only appropriate intervention which can prevent rapid exacerbations which would otherwise lead to repeat hospital admissions.  Simple CPAP or BiPAP is not felt to be sufficient given the severity of the patient's condition.  Failure to have noninvasive ventilation available for the patient at home will most assuredly lead to her repeat hospitalization and could potentially even lead to her untimely death.  Cherene Altes, MD [facility] Office  [phone] Pager - Text Page per Amion as per below:  On-Call/Text Page:      [email or web address]      password [email or web address]  If 7PM-7AM, please contact night-coverage [email or web address] Password [email or web address] 03/29/2018, 4:45 PM

## 2018-03-29 NOTE — Progress Notes (Signed)
[place] TEAM 1 - Stepdown/ICU TEAM  Jasmine [name]  ZJI:[id number] DOB: 1946-06-15 DOA: 03/27/2018 PCP: Biagio Borg, MD    Brief Narrative:  72 y.o. female with a hx of COPD, diastolic CHF, cocaine abuse, hypothyroidism, and anxiety who presented w/ confusion and somnolence.  Patient was discharged the day prior after her second admission in the past week under similar circumstances (this now being her third identical admission this month).  She has been presenting to the ED with confusion, is found to be hypercarbic, and then mental status clears with BiPAP.    In the ED an acute infection w/u was negative, and a VBG revealed a normal pH with a pCO2 of 89.   Subjective: The patient is awake alert and conversant this morning.  She is resting comfortably in bed.  She denies chest pain nausea or vomiting.  I had an extended discussion with her husband in the hallway.  He states that at home she essentially lays in bed all day and naps frequently.  She has difficulty ambulating related to dyspnea and severe generalized weakness.  He also expresses concern that she may be using "too much oxygen" at home.  He states that it was prescribed at 2 L/m but that she "feels best" when she turns it all the way up to 4 L/m.  He feels he notices her becoming more lethargic after doing so.  Assessment & Plan:  Acute encephalopathy  secondary to hypercarbia either related to untreated sleep apnea versus benzodiazepine abuse in the setting of severe COPD versus both - also possibly a component of excess O2 supplementation in setting of severe COPD - negative head CT 6/5 - normal TSH 6/6 - doing very well in the hospital w/ careful O2 titration and nightly BIPAP - ask PT/OT to see - unclear if going home will be her best option - if she does go home she will REQUIRE home BIPAP, or at very least CPAP  Acute on chronic hypercarbic and hypoxic respiratory failure - COPD  See discussion above  Chronic grade 1  diastolic CHF  No volume overload on exam - weight stable/declining - follow w/o change in tx today  Filed Weights   03/27/18 2042 03/29/18 0330  Weight: 54.4 kg (120 lb) 53 kg (116 lb 13.5 oz)    Hypothyroidism  TSH was normal 03/25/18 - continue Synthroid    Anxiety  continue Celexa - stopped benzodiazepines w/o delitirous effect thus far   Polysubstance abuse  Denies recent use - UDS + for cocaine March 2019  Macrocytosis  ?EtOH abuse - B12 is normal - Folate is pending   DVT prophylaxis: Lovenox Code Status: FULL CODE Family Communication: spoke to husband at length Disposition Plan: SDU due to need for BIPAP QHS  Consultants:  none  Antimicrobials:  none   Objective: Blood pressure 106/68, pulse 87, temperature 97.7 F (36.5 C), temperature source Oral, resp. rate 19, height 5' (1.524 m), weight 53 kg (116 lb 13.5 oz), SpO2 100 %.  Intake/Output Summary (Last 24 hours) at 03/29/2018 0859 Last data filed at 03/29/2018 0500 Gross per 24 hour  Intake 840 ml  Output 110 ml  Net 730 ml   Filed Weights   03/27/18 2042 03/29/18 0330  Weight: 54.4 kg (120 lb) 53 kg (116 lb 13.5 oz)    Examination: General: No acute respiratory distress - alert  Lungs: Clear to auscultation bilaterally without wheezes or crackles - poor air movement all fields  Cardiovascular:  RRR Abdomen: NT/ND, soft, BS+, no mass  Extremities: No edema B LE   CBC: Recent Labs  Lab 03/24/18 1612  03/25/18 0535 03/27/18 2102 03/29/18 0240  WBC 4.6  --  5.0 3.8* 5.6  NEUTROABS 3.0  --   --  2.0  --   HGB 15.0   < > 11.6* 12.4 11.2*  HCT 52.0*   < > 40.9 43.0 37.5  MCV 105.3*  --  106.8* 105.9* 103.3*  PLT 156  --  150 172 162   < > = values in this interval not displayed.   Basic Metabolic Panel: Recent Labs  Lab 03/25/18 0535  03/27/18 2102 03/28/18 0336 03/29/18 0240  NA 147*   < > 140 141 140  K 4.0   < > 5.1 3.9 4.1  CL 95*   < > 92* 92* 85*  CO2 41*   < > 42* 43* 47*    GLUCOSE 88   < > 114* 141* 107*  BUN 10   < > <5* <5* 6  CREATININE 0.71   < > 0.63 0.54 0.59  CALCIUM 9.4   < > 9.2 9.2 9.4  MG 1.3*  --   --   --   --   PHOS 1.8*  --   --   --   --    < > = values in this interval not displayed.   GFR: Estimated Creatinine Clearance: 45 mL/min (by C-G formula based on SCr of 0.59 mg/dL).  Liver Function Tests: Recent Labs  Lab 03/22/18 1400 03/24/18 1612 03/25/18 0535 03/29/18 0240  AST 24 27 17 19   ALT 12* 10* 9* 11*  ALKPHOS 56 54 43 42  BILITOT 1.2 1.0 0.9 0.6  PROT 8.1 8.2* 6.3* 5.8*  ALBUMIN 4.4 4.4 3.3* 3.1*    Recent Labs  Lab 03/24/18 1612 03/28/18 0004 03/29/18 0240  AMMONIA 56* 47* 36*    Cardiac Enzymes: Recent Labs  Lab 03/24/18 2116 03/25/18 0010 03/25/18 0535  TROPONINI <0.03 <0.03 <0.03    HbA1C: Hgb A1c MFr Bld  Date/Time Value Ref Range Status  03/29/2018 02:40 AM 5.7 (H) 4.8 - 5.6 % Final    Comment:    (NOTE) Pre diabetes:          5.7%-6.4% Diabetes:              >6.4% Glycemic control for   <7.0% adults with diabetes   06/03/2017 04:22 PM 6.3 4.6 - 6.5 % Final    Comment:    Glycemic Control Guidelines for People with Diabetes:Non Diabetic:  <6%Goal of Therapy: <7%Additional Action Suggested:  >8%     CBG: Recent Labs  Lab 03/24/18 1618  GLUCAP 107*    Recent Results (from the past 240 hour(s))  MRSA PCR Screening     Status: None   Collection Time: 03/22/18  8:32 PM  Result Value Ref Range Status   MRSA by PCR NEGATIVE NEGATIVE Final    Comment:        The GeneXpert MRSA Assay (FDA approved for NASAL specimens only), is one component of a comprehensive MRSA colonization surveillance program. It is not intended to diagnose MRSA infection nor to guide or monitor treatment for MRSA infections. Performed at [facility], [place] [address], [place], [place] [address]   Urine culture     Status: None   Collection Time: 03/24/18  7:25 PM  Result Value Ref Range  Status   Specimen Description URINE, RANDOM  Final   Special Requests NONE  Final   Culture   Final    NO GROWTH Performed at [facility], [place] [address], [place], [place] [address]    Report Status 03/25/2018 FINAL  Final  MRSA PCR Screening     Status: None   Collection Time: 03/24/18 11:03 PM  Result Value Ref Range Status   MRSA by PCR NEGATIVE NEGATIVE Final    Comment:        The GeneXpert MRSA Assay (FDA approved for NASAL specimens only), is one component of a comprehensive MRSA colonization surveillance program. It is not intended to diagnose MRSA infection nor to guide or monitor treatment for MRSA infections. Performed at [facility], [place] [address], [place], [place] [address]      Scheduled Meds: . aspirin EC  81 mg Oral Daily  . citalopram  20 mg Oral Daily  . enoxaparin (LOVENOX) injection  40 mg Subcutaneous Daily  . feeding supplement (ENSURE ENLIVE)  237 mL Oral TID BM  . fluticasone furoate-vilanterol  1 puff Inhalation Daily  . ipratropium-albuterol  3 mL Nebulization BID  . levothyroxine  75 mcg Oral QAC breakfast     LOS: 2 days   Cherene Altes, MD [facility] Office  [phone] Pager - Text Page per Amion as per below:  On-Call/Text Page:      [email or web address]      password [email or web address]  If 7PM-7AM, please contact night-coverage [email or web address] Password [email or web address] 03/29/2018, 8:59 AM

## 2018-03-29 NOTE — Telephone Encounter (Signed)
Copied from [place] [id number]. Topic: Quick Communication - See Telephone Encounter >> Mar 29, 2018  2:18 PM Hewitt Shorts wrote: Pt is in the hospital and is wanting to know if it is normal for her not to see her PCP while she is in the hospital  Her cell phone is [phone] and if no one answers call [phone] asfor room 16 on [place]

## 2018-03-29 NOTE — Telephone Encounter (Signed)
Yes, that is true, as patients are usually seen by the Hospitalist and any other consultants in the hospital while there

## 2018-03-29 NOTE — Care Management Note (Signed)
Case Management Note  Patient Details  Name: Jasmine [name] MRN: [id number] Date of Birth: [birth year]  Subjective/Objective: Pt presented for confusion and somnolence. PTA from home with the support of spouse. Pt has 02 at home via [place]. May need BIPAP- for home (pt wants to use [facility] for BIPAP if needed). CM did reach out to MD to see if orders could get signed. Unable to speak with physician. Pt was previously active with [facility] prior to arrival. Pt will need resumption orders for [facility] RN, PT/OT, Aide, CSW & F2F.             Action/Plan: [facility] to follow the patient in the Community. CM will continue to monitor for additional needs.   Expected Discharge Date:  03/31/18               Expected Discharge Plan:  [place]  In-House Referral:  NA  Discharge planning Services  CM Consult  Post Acute Care Choice:  Home Health, Resumption of Svcs/PTA Provider Choice offered to:  Patient  DME Arranged:  Bipap DME Agency:  [place] Arranged:  RN, Disease Management, PT, OT, Nurse's Aide, Social Work [employer] Agency:  [place]  Status of Service:  In process, will continue to follow  If discussed at Long Length of Stay Meetings, dates discussed:    Additional Comments:  Bethena Roys, RN 03/29/2018, 3:50 PM

## 2018-03-29 NOTE — Progress Notes (Signed)
   03/29/18 1400  Clinical Encounter Type  Visited With Patient and family together  Visit Type Initial  Referral From Nurse  Consult/Referral To Chaplain  Spiritual Encounters  Spiritual Needs Brochure;Emotional  Stress Factors  Patient Stress Factors Exhausted  Family Stress Factors Exhausted    Patient was in her room with her husband. Chaplain explained about the POA paperwork Pt and husband asked to be given time till tomorrow to decide. Chaplain provided compassionate presence and POA papers

## 2018-03-29 NOTE — Consult Note (Addendum)
   [facility] CM Inpatient Consult   03/29/2018  ROYAL BEIRNE [birth year] [id number]    Patient screened for [place] Management program due to risk of unplanned readmission score of 27% (high) and hospital readmit of 1 day.  Spoke with Mrs. Gallogly at bedside about [place] Management services. She is agreeable and [facility] Care Management written consent obtained.   Mrs. Slappey endorses she lives with her significant other (states she calls him husband). Denies having concerns with obtaining medications. States she uses [employer] for mail delivery. Denies having any  concerns with transportation to MD appointments. However, states she would like more information regarding her [employer] transportation. States she called her [employer] rep and was advised that she did not have a transportation benefit. Confirmed Primary Care MD is [name] (Shelba Flake is listed as doing transition of care call). Confirmed best contact number for Mrs. Nierenberg is [phone].  Explained that [place] Management will not interfere or replace services provided by home health. States she was active with [facility] for home health services prior.  Discussed [facility] Care Management follow up for COPD or CHF. States she has a scale but does not weigh daily.   Will make referral for [place].  Will make (covering) inpatient RNCM aware that [place] Management will follow post discharge.   Marthenia Rolling, MSN-Ed, RN,BSN [facility] Liaison [phone]

## 2018-03-29 NOTE — Progress Notes (Signed)
Pt placed on BIPAP for the night.  Tolerating well. 

## 2018-03-29 NOTE — Evaluation (Signed)
Physical Therapy Evaluation Patient Details Name: Jasmine [name] MRN: [id number] DOB: 07-02-1946 Today's Date: 03/29/2018   History of Present Illness  Jasmine [name] is a 72yo black female with medical history significant of HTN, COPDon 3-4 L of O2, cor pulmonale, chronic diastolic CHF,  chronic respiratory failure with hypercapnia cocaine and tobacco abuse in the past, comes back to [facility] with AMS related to hypercarbia.   Clinical Impression  Pt admitted with above diagnosis. Pt currently with functional limitations due to the deficits listed below (see "PT Problem List"). Upon entry, pt in bed, no family/caregiver present. The pt is awake and agreeable to participate.  The pt is alert and oriented x3, pleasant, conversational, and following simple commands consistently. Functional mobility assessment demonstrates minimally increased effort/time requirements, poor tolerance with quick O2 desaturation, however no need for physical assistance: pt reports she may feel a bit stronger than compared to PTA. Pt will benefit from skilled PT intervention to increase independence and safety with basic mobility in preparation for discharge to the venue listed below.       Follow Up Recommendations Home health PT;Supervision - Intermittent    Equipment Recommendations  None recommended by PT    Recommendations for Other Services       Precautions / Restrictions Precautions Precautions: Fall Restrictions Weight Bearing Restrictions: No      Mobility  Bed Mobility Overal bed mobility: Modified Independent             General bed mobility comments: additional time/effort needed.   Transfers Overall transfer level: Modified independent Equipment used: None Transfers: Sit to/from Stand Sit to Stand: Supervision         General transfer comment: PT assists with lines/leads  Ambulation/Gait Ambulation/Gait assistance: Min guard Ambulation Distance (Feet): 50 Feet Assistive device:  None Gait Pattern/deviations: (multiple turns, performed at bedside, without physical assistance, no external stabilizaer necessary. )     General Gait Details: Performed on 5L/min O2 via [place].   Stairs            Wheelchair Mobility    Modified Rankin (Stroke Patients Only)       Balance   Sitting-balance support: No upper extremity supported;Feet supported Sitting balance-Leahy Scale: Normal     Standing balance support: No upper extremity supported Standing balance-Leahy Scale: Good Standing balance comment: moving well without LOB                              Pertinent Vitals/Pain Pain Assessment: No/denies pain    Home Living Family/patient expects to be discharged to:: Private residence Living Arrangements: Spouse/significant other Available Help at Discharge: Family(nieces, nephews nearby) Type of Home: House Home Access: Ramped entrance     Home Layout: One level Home Equipment: Environmental consultant - 4 wheels;Walker - 2 wheels Additional Comments: still gets into bath tub for bathing. independent in dressing bathing.     Prior Function Level of Independence: Independent         Comments: On chronic O2, 4L at baseline; AMB in the community as needed only d/t allergies, but drives adn goes out 0-6T monthly.      Hand Dominance   Dominant Hand: Right    Extremity/Trunk Assessment                Communication   Communication: No difficulties  Cognition Arousal/Alertness: Awake/alert Behavior During Therapy: WFL for tasks assessed/performed Overall Cognitive Status: Within Functional Limits for tasks assessed  General Comments      Exercises     Assessment/Plan    PT Assessment Patient needs continued PT services  PT Problem List Decreased balance;Decreased mobility;Cardiopulmonary status limiting activity;Decreased strength;Decreased activity tolerance       PT Treatment  Interventions DME instruction;Gait training;Functional mobility training;Therapeutic activities;Therapeutic exercise;Balance training;Cognitive remediation;Patient/family education    PT Goals (Current goals can be found in the Care Plan section)  Acute Rehab PT Goals Patient Stated Goal: improve AMB tolerance on baseline flowrate PT Goal Formulation: With patient Time For Goal Achievement: 04/12/18 Potential to Achieve Goals: Fair    Frequency Min 3X/week   Barriers to discharge        Co-evaluation               AM-PAC PT "6 Clicks" Daily Activity  Outcome Measure Difficulty turning over in bed (including adjusting bedclothes, sheets and blankets)?: A Little Difficulty moving from lying on back to sitting on the side of the bed? : A Little Difficulty sitting down on and standing up from a chair with arms (e.g., wheelchair, bedside commode, etc,.)?: A Little Help needed moving to and from a bed to chair (including a wheelchair)?: A Little Help needed walking in hospital room?: A Little Help needed climbing 3-5 steps with a railing? : A Lot 6 Click Score: 17    End of Session Equipment Utilized During Treatment: Oxygen Activity Tolerance: Patient tolerated treatment well;No increased pain;Treatment limited secondary to medical complications (Comment)(SpO2 drops to 89% after AMB 70ft) Patient left: in chair;with call bell/phone within reach;with bed alarm set Nurse Communication: Mobility status PT Visit Diagnosis: Other abnormalities of gait and mobility (R26.89);Difficulty in walking, not elsewhere classified (R26.2)    Time: 1610-9604 PT Time Calculation (min) (ACUTE ONLY): 19 min   Charges:   PT Evaluation $PT Eval Moderate Complexity: 1 Mod PT Treatments $Therapeutic Activity: 8-22 mins   PT G Codes:        7:40 PM, 04-19-2018 Etta Grandchild, PT, DPT Physical Therapist - [place] [phone] (Pager)  [phone] (Office)     Buccola,Allan  C 2018-04-19, 7:38 PM

## 2018-03-30 ENCOUNTER — Ambulatory Visit: Payer: Medicare HMO | Admitting: Pulmonary Disease

## 2018-03-30 LAB — BASIC METABOLIC PANEL
Anion gap: 9 (ref 5–15)
BUN: 10 mg/dL (ref 6–20)
CO2: 44 mmol/L — ABNORMAL HIGH (ref 22–32)
CREATININE: 0.62 mg/dL (ref 0.44–1.00)
Calcium: 9.5 mg/dL (ref 8.9–10.3)
Chloride: 87 mmol/L — ABNORMAL LOW (ref 101–111)
Glucose, Bld: 107 mg/dL — ABNORMAL HIGH (ref 65–99)
Potassium: 4.1 mmol/L (ref 3.5–5.1)
Sodium: 140 mmol/L (ref 135–145)

## 2018-03-30 LAB — CBC
HCT: 33.6 % — ABNORMAL LOW (ref 36.0–46.0)
Hemoglobin: 10.4 g/dL — ABNORMAL LOW (ref 12.0–15.0)
MCH: 30.4 pg (ref 26.0–34.0)
MCHC: 31 g/dL (ref 30.0–36.0)
MCV: 98.2 fL (ref 78.0–100.0)
PLATELETS: 176 10*3/uL (ref 150–400)
RBC: 3.42 MIL/uL — AB (ref 3.87–5.11)
RDW: 13.4 % (ref 11.5–15.5)
WBC: 5 10*3/uL (ref 4.0–10.5)

## 2018-03-30 NOTE — Progress Notes (Signed)
Received message from [facility] with [place] stating that the BIPAP has been approved and will be delivered to the patient's room tomorrow; Aneta Mins [phone]

## 2018-03-30 NOTE — Care Management Important Message (Signed)
Important Message  Patient Details  Name: Jasmine [name] MRN: [id number] Date of Birth: [birth year]   Medicare Important Message Given:  Yes    Orbie Pyo 03/30/2018, 1:05 PM

## 2018-03-30 NOTE — Progress Notes (Signed)
[place] TEAM 1 - Stepdown/ICU TEAM  CAROLEEN STOERMER  RJJ:[id number] DOB: [birth year] DOA: 03/27/2018 PCP: Biagio Borg, MD    Brief Narrative:  72 y.o. female with a hx of COPD, diastolic CHF, cocaine abuse, hypothyroidism, and anxiety who presented w/ confusion and somnolence.  Patient was discharged the day prior after her second admission in the past week under similar circumstances (this now being her third identical admission this month).  She has been presenting to the ED with confusion, is found to be hypercarbic, and then mental status clears with BiPAP.    In the ED an acute infection w/u was negative, and a VBG revealed a normal pH with a pCO2 of 89.   Subjective: THE PATIENT IS RESTING COMFORTABLY IN BED AT THIS TIME.  SHE IS ALERT ORIENTED AND CONVERSANT.  SHE ASKED APPROPRIATE QUESTIONS.  SHE DENIES CHEST PAIN NAUSEA VOMITING OR ABDOMINAL PAIN  Assessment & Plan:  Acute encephalopathy  secondary to hypercarbia either related to untreated sleep apnea versus benzodiazepine abuse in the setting of severe COPD versus both - also likely a component of excess O2 supplementation in setting of severe COPD - negative head CT 6/5 - normal TSH 6/6 - doing very well in the hospital w/ careful O2 titration and nightly BIPAP - PT/OT to see - she will REQUIRE home Trilogy support - also discussed need to keep sats 88-92% and avoid excessive O2 supplementation   Acute on chronic hypercarbic and hypoxic respiratory failure - COPD  See discussion above  Chronic grade 1 diastolic CHF  No volume overload on exam - weight stable/exam stable  Filed Weights   03/27/18 2042 03/29/18 0330 03/30/18 0437  Weight: 54.4 kg (120 lb) 53 kg (116 lb 13.5 oz) 53.7 kg (118 lb 6.2 oz)    Hypothyroidism  TSH was normal 03/25/18 - continue Synthroid    Anxiety  continue Celexa - stopped benzodiazepines w/o delitirous effect thus far - would NOT resume and sedating meds   Polysubstance abuse  Denies  recent use - UDS + for cocaine March 2019  Macrocytosis  ?EtOH abuse - B12 is normal - Folate is normal   DVT prophylaxis: Lovenox Code Status: FULL CODE Family Communication: spoke to husband at length at bedside  Disposition Plan: SDU due to need for BIPAP QHS - potential d/c home 6/12  Consultants:  none  Antimicrobials:  none   Objective: Blood pressure (!) 100/56, pulse 73, temperature 98.8 F (37.1 C), temperature source Oral, resp. rate (!) 22, height 5' (1.524 m), weight 53.7 kg (118 lb 6.2 oz), SpO2 99 %.  Intake/Output Summary (Last 24 hours) at 03/30/2018 1158 Last data filed at 03/30/2018 0900 Gross per 24 hour  Intake 480 ml  Output -  Net 480 ml   Filed Weights   03/27/18 2042 03/29/18 0330 03/30/18 0437  Weight: 54.4 kg (120 lb) 53 kg (116 lb 13.5 oz) 53.7 kg (118 lb 6.2 oz)    Examination: General: No acute respiratory distress - alert and conversant  Lungs: Clear to auscultation B - distant BS in all fields - no wheezing  Cardiovascular: RRR w/o M or rub  Abdomen: NT/ND, soft, BS+, no mass  Extremities: No signif edema B LE   CBC: Recent Labs  Lab 03/24/18 1612  03/27/18 2102 03/28/18 0336 03/29/18 0240 03/30/18 0349  WBC 4.6   < > 3.8*  --  5.6 5.0  NEUTROABS 3.0  --  2.0  --   --   --  HGB 15.0   < > 12.4  --  11.2* 10.4*  HCT 52.0*   < > 43.0 33.9* 37.5 33.6*  MCV 105.3*   < > 105.9*  --  103.3* 98.2  PLT 156   < > 172  --  162 176   < > = values in this interval not displayed.   Basic Metabolic Panel: Recent Labs  Lab 03/25/18 0535  03/28/18 0336 03/29/18 0240 03/30/18 0349  NA 147*   < > 141 140 140  K 4.0   < > 3.9 4.1 4.1  CL 95*   < > 92* 85* 87*  CO2 41*   < > 43* 47* 44*  GLUCOSE 88   < > 141* 107* 107*  BUN 10   < > <5* 6 10  CREATININE 0.71   < > 0.54 0.59 0.62  CALCIUM 9.4   < > 9.2 9.4 9.5  MG 1.3*  --   --   --   --   PHOS 1.8*  --   --   --   --    < > = values in this interval not displayed.   GFR: Estimated  Creatinine Clearance: 45 mL/min (by C-G formula based on SCr of 0.62 mg/dL).  Liver Function Tests: Recent Labs  Lab 03/24/18 1612 03/25/18 0535 03/29/18 0240  AST 27 17 19   ALT 10* 9* 11*  ALKPHOS 54 43 42  BILITOT 1.0 0.9 0.6  PROT 8.2* 6.3* 5.8*  ALBUMIN 4.4 3.3* 3.1*    Recent Labs  Lab 03/24/18 1612 03/28/18 0004 03/29/18 0240  AMMONIA 56* 47* 36*    Cardiac Enzymes: Recent Labs  Lab 03/24/18 2116 03/25/18 0010 03/25/18 0535  TROPONINI <0.03 <0.03 <0.03    HbA1C: Hgb A1c MFr Bld  Date/Time Value Ref Range Status  03/29/2018 02:40 AM 5.7 (H) 4.8 - 5.6 % Final    Comment:    (NOTE) Pre diabetes:          5.7%-6.4% Diabetes:              >6.4% Glycemic control for   <7.0% adults with diabetes   06/03/2017 04:22 PM 6.3 4.6 - 6.5 % Final    Comment:    Glycemic Control Guidelines for People with Diabetes:Non Diabetic:  <6%Goal of Therapy: <7%Additional Action Suggested:  >8%     CBG: Recent Labs  Lab 03/24/18 1618  GLUCAP 107*    Recent Results (from the past 240 hour(s))  MRSA PCR Screening     Status: None   Collection Time: 03/22/18  8:32 PM  Result Value Ref Range Status   MRSA by PCR NEGATIVE NEGATIVE Final    Comment:        The GeneXpert MRSA Assay (FDA approved for NASAL specimens only), is one component of a comprehensive MRSA colonization surveillance program. It is not intended to diagnose MRSA infection nor to guide or monitor treatment for MRSA infections. Performed at [facility], [place] [address] [place], [place] [address]   Urine culture     Status: None   Collection Time: 03/24/18  7:25 PM  Result Value Ref Range Status   Specimen Description URINE, RANDOM  Final   Special Requests NONE  Final   Culture   Final    NO GROWTH Performed at [facility], [place] [address] [place], [place] [address]    Report Status 03/25/2018 FINAL  Final  MRSA PCR Screening  Status: None   Collection Time:  03/24/18 11:03 PM  Result Value Ref Range Status   MRSA by PCR NEGATIVE NEGATIVE Final    Comment:        The GeneXpert MRSA Assay (FDA approved for NASAL specimens only), is one component of a comprehensive MRSA colonization surveillance program. It is not intended to diagnose MRSA infection nor to guide or monitor treatment for MRSA infections. Performed at [facility], [place] [address] [place], [place] [address]      Scheduled Meds: . aspirin EC  81 mg Oral Daily  . citalopram  20 mg Oral Daily  . enoxaparin (LOVENOX) injection  40 mg Subcutaneous Daily  . feeding supplement (ENSURE ENLIVE)  237 mL Oral TID BM  . fluticasone furoate-vilanterol  1 puff Inhalation Daily  . ipratropium-albuterol  3 mL Nebulization BID  . levothyroxine  75 mcg Oral QAC breakfast     LOS: 3 days   Cherene Altes, MD [facility] Office  [phone] Pager - Text Page per Amion as per below:  On-Call/Text Page:      [email or web address]      password [email or web address]  If 7PM-7AM, please contact night-coverage [email or web address] Password [facility] 03/30/2018, 11:58 AM

## 2018-03-30 NOTE — Evaluation (Signed)
Occupational Therapy Evaluation Patient Details Name: Jasmine [name] MRN: [id number] DOB: 10-04-1946 Today's Date: 03/30/2018    History of Present Illness Jasmine [name] is a 72yo black female with medical history significant of HTN, COPDon 3-4 L of O2, cor pulmonale, chronic diastolic CHF,  chronic respiratory failure with hypercapnia cocaine and tobacco abuse in the past, comes back to [facility] with AMS related to hypercarbia.    Clinical Impression   This 72 y/o female presents with the above. At baseline pt reports completing functional mobility independently, reports she receives intermittent assist from significant other for tub transfers and completes other ADLs without assist, completing iADLs such as laundry. Pt completing room level functional mobility and toileting ADLs without AD and minA this session; completing seated grooming ADLs with setup assist and currently requiring minA for LB ADLs. Pt demonstrating higher level cognitive difficulties, requiring min cues for safety and for recall of therapist's instructions. Pt on 4L O2 during session with lowest SpO2 84% during completion of oral care, otherwise remaining above 91% with room level activity. Pt will benefit from continued acute OT services and recommend follow up [facility] services provided pt has 24hr assist (initially) after return home. Given pt's history of readmissions and current level of assist, feel pt would also benefit from HomeFirst program with [facility] if eligible. Will follow.     Follow Up Recommendations  Home health OT;Supervision/Assistance - 24 hour;Other (comment)(may benefit from HomeFirst with [facility] if eligible)    Equipment Recommendations  Tub/shower seat           Precautions / Restrictions Precautions Precautions: Fall Restrictions Weight Bearing Restrictions: No      Mobility Bed Mobility Overal bed mobility: Modified Independent             General bed mobility comments: additional  time/effort needed.   Transfers Overall transfer level: Needs assistance Equipment used: None Transfers: Sit to/from Stand Sit to Stand: Supervision         General transfer comment: supervision for safety and to assist with lines/leads    Balance Overall balance assessment: Needs assistance Sitting-balance support: No upper extremity supported;Feet supported Sitting balance-Leahy Scale: Good     Standing balance support: No upper extremity supported Standing balance-Leahy Scale: Fair Standing balance comment: maintains static standing without UE support; intermittently seeking UE support during room level ambulation                            ADL either performed or assessed with clinical judgement   ADL Overall ADL's : Needs assistance/impaired Eating/Feeding: Supervision/ safety   Grooming: Wash/dry hands;Oral care;Supervision/safety;Sitting   Upper Body Bathing: Min guard;Sitting   Lower Body Bathing: Min guard;Sit to/from stand   Upper Body Dressing : Set up;Sitting   Lower Body Dressing: Min guard;Sit to/from stand   Toilet Transfer: Minimal assistance;Ambulation;Regular Toilet   Toileting- Clothing Manipulation and Hygiene: Minimal assistance;Sit to/from stand Toileting - Clothing Manipulation Details (indicate cue type and reason): assist for gown management      Functional mobility during ADLs: Minimal assistance General ADL Comments: pt ambulating without AD this session and minA, completing toileting and seated grooming ADLs at sink prior to transfer to recliner; pt on 4L O2 throughout session                          Pertinent Vitals/Pain Pain Assessment: No/denies pain     Hand Dominance Right  Extremity/Trunk Assessment Upper Extremity Assessment Upper Extremity Assessment: Generalized weakness   Lower Extremity Assessment Lower Extremity Assessment: Defer to PT evaluation   Cervical / Trunk Assessment Cervical / Trunk  Assessment: Normal   Communication Communication Communication: No difficulties   Cognition Arousal/Alertness: Awake/alert Behavior During Therapy: WFL for tasks assessed/performed Overall Cognitive Status: No family/caregiver present to determine baseline cognitive functioning Area of Impairment: Attention;Memory                   Current Attention Level: Selective Memory: Decreased short-term memory   Safety/Judgement: Decreased awareness of safety Awareness: Emergent   General Comments: pt A&Ox4, cognition WFL for basic tasks but noted intrmittent difficulties with higher level cognition, intermittently required cues to recall instructions from therapist and for safety. Pt does demonstrate some higher level reasoning, verbalizing wishes to practice ambulating to bathroom (vs BSC) as this is what she will have to do after return home    General Comments  Pt on on 4L O2 via nasal cannula, decreased SpO2 to mid 80's during oral care, though otherwise remaining above 91%                Home Living Family/patient expects to be discharged to:: Private residence Living Arrangements: Spouse/significant other Available Help at Discharge: Family(reports has good relationship with ex-husband who assists ) Type of Home: House Home Access: Ramped entrance     Home Layout: One level     Bathroom Shower/Tub: Teacher, early years/pre: Standard Bathroom Accessibility: Yes   Home Equipment: Environmental consultant - 4 wheels;Walker - 2 wheels   Additional Comments: still gets into bath tub for bathing. independent in dressing bathing.       Prior Functioning/Environment Level of Independence: Needs assistance  Gait / Transfers Assistance Needed: reports independence  ADL's / Homemaking Assistance Needed: reports significant other sometimes assists with tub transfers, reports she still performs iADLs such as laundry   Comments: On chronic O2, 4L at baseline        OT Problem  List: Decreased activity tolerance;Impaired balance (sitting and/or standing);Decreased coordination;Decreased safety awareness;Cardiopulmonary status limiting activity;Decreased strength      OT Treatment/Interventions: Self-care/ADL training;Therapeutic exercise;Energy conservation;Therapeutic activities;Patient/family education;Balance training    OT Goals(Current goals can be found in the care plan section) Acute Rehab OT Goals Patient Stated Goal: hopeful for home soon  OT Goal Formulation: With patient Time For Goal Achievement: 04/13/18 Potential to Achieve Goals: Good  OT Frequency: Min 2X/week   Barriers to D/C:            Co-evaluation              AM-PAC PT "6 Clicks" Daily Activity     Outcome Measure Help from another person eating meals?: None Help from another person taking care of personal grooming?: A Little Help from another person toileting, which includes using toliet, bedpan, or urinal?: A Little   Help from another person to put on and taking off regular upper body clothing?: None Help from another person to put on and taking off regular lower body clothing?: A Little 6 Click Score: 17   End of Session Equipment Utilized During Treatment: Gait belt;Oxygen Nurse Communication: Mobility status  Activity Tolerance: Patient tolerated treatment well Patient left: in chair;with call bell/phone within reach  OT Visit Diagnosis: Unsteadiness on feet (R26.81);Muscle weakness (generalized) (M62.81)                Time: 8242-3536 OT Time Calculation (min): 45 min  Charges:  OT General Charges $OT Visit: 1 Visit OT Evaluation $OT Eval Moderate Complexity: 1 Mod OT Treatments $Self Care/Home Management : 23-37 mins G-Codes:     Lou Cal, OT Pager [phone] 03/30/2018   Raymondo Band 03/30/2018, 4:45 PM

## 2018-03-31 DIAGNOSIS — I10 Essential (primary) hypertension: Secondary | ICD-10-CM

## 2018-03-31 DIAGNOSIS — F418 Other specified anxiety disorders: Secondary | ICD-10-CM

## 2018-03-31 DIAGNOSIS — G9340 Encephalopathy, unspecified: Secondary | ICD-10-CM

## 2018-03-31 DIAGNOSIS — D7589 Other specified diseases of blood and blood-forming organs: Secondary | ICD-10-CM

## 2018-03-31 DIAGNOSIS — E890 Postprocedural hypothyroidism: Secondary | ICD-10-CM

## 2018-03-31 DIAGNOSIS — I5032 Chronic diastolic (congestive) heart failure: Secondary | ICD-10-CM

## 2018-03-31 DIAGNOSIS — F1910 Other psychoactive substance abuse, uncomplicated: Secondary | ICD-10-CM

## 2018-03-31 DIAGNOSIS — J9622 Acute and chronic respiratory failure with hypercapnia: Secondary | ICD-10-CM

## 2018-03-31 DIAGNOSIS — J431 Panlobular emphysema: Secondary | ICD-10-CM

## 2018-03-31 NOTE — Progress Notes (Signed)
PROGRESS NOTE    Jasmine [name]  OAC:[id number] DOB: 09-04-1946 DOA: 03/27/2018 PCP: Biagio Borg, MD   Brief Narrative:  72 y.o. BF PMHx COPD, OSA, Chronic Diastolic CHF, Cocaine abuse, Hypothyroidism, Anxiety   Presented w/ confusion and somnolence.  Patient was discharged the day prior after her second admission in the past week under similar circumstances (this now being her third identical admission this month).  She has been presenting to the ED with confusion, is found to be hypercarbic, and then mental status clears with BiPAP.     In the ED an acute infection w/u was negative, and a VBG revealed a normal pH with a pCO2 of 89.     Subjective: 6/12 alert on the telephone, identified myself and patient told me to wait and continue to speak on the phone refused to hang up in order to engage in dialogue and exam.     Assessment & Plan:   Principal Problem:   Acute encephalopathy Active Problems:   Depression with anxiety   Essential hypertension   COPD (chronic obstructive pulmonary disease) ([place])   Hypothyroidism following radioiodine therapy   Chronic diastolic congestive heart failure ([place])   Acute on chronic respiratory failure with hypercapnia ([place])  Acute encephalopathy - Multifactorial to include noncompliance, substance abuse (cocaine use), benzodiazepine abuse, OSA not on CPAP/BiPAP -O2 to maintain SPO2 89 to 93% -BiPAP at night.  NOTE BiPAP has been arranged for patient at discharge sitting in room.  BiPAP machine to be used tonight and settings adjusted in anticipation of A.m. discharge -Resolved  Acute on chronic respiratory failure with hypoxia and hypercapnia /COPD exacerbation  -See acute encephalopathy   -Patient Saturations on Room Air at Rest = 88% Patient Saturations on Room Air while Ambulating = N/A Patient Saturations on 4 Liters of oxygen while Ambulating = 79%  2 L O2 while sedentary increase to 4 L O2 with exertion.  Titrate O2 to maintain  SPO2 89 to 01%  Chronic diastolic CHF - Strict in and out -Daily weight - Transfuse for hemoglobin<8  Hypothyroidism - 6/6 TSH WNL -Synthroid 75 mcg daily   Anxiety  -Celexa 20 mg daily  -Benzodiazepine discontinued secondary to patient overuse/abuse    Polysubstance abuse -  6/5 UDS positive benzodiazepine: On Xanax as outpatient -Previous UDS in March 2019+ cocaine  Macrocytosis - EtOH abuse? -B12 normal -Folate pending     DVT prophylaxis: Lovenox Code Status: Full Family Communication: None Disposition Plan: Discharge in A.m.   Consultants:  None  Procedures/Significant Events:     I have personally reviewed and interpreted all radiology studies and my findings are as above.  VENTILATOR SETTINGS:    Cultures   Antimicrobials:    Devices    LINES / TUBES:      Continuous Infusions:   Objective: Vitals:   03/31/18 0813 03/31/18 0846 03/31/18 0850 03/31/18 1206  BP: 126/66 126/66  (!) 122/53  Pulse: 83 89  87  Resp: 16 14  17   Temp: 98.3 F (36.8 C)   98 F (36.7 C)  TempSrc: Oral   Oral  SpO2: 96% 95% 95% 97%  Weight:      Height:        Intake/Output Summary (Last 24 hours) at 03/31/2018 1544 Last data filed at 03/30/2018 1800 Gross per 24 hour  Intake 240 ml  Output -  Net 240 ml   Filed Weights   03/29/18 0330 03/30/18 0437 03/31/18 0401  Weight: 116 lb 13.5  oz (53 kg) 118 lb 6.2 oz (53.7 kg) 118 lb 6.2 oz (53.7 kg)    Examination:  General: Alert, talking on phone, No acute respiratory distress Central nervous system:  Cranial nerves II through XII intact, tongue/uvula midline, moves all extremities spontaneously.  Exam deferred secondary to patient refusing to put down phone in order to engage in exam.   .     Data Reviewed: Care during the described time interval was provided by me .  I have reviewed this patient's available data, including medical history, events of note, physical examination, and all test  results as part of my evaluation.   CBC: Recent Labs  Lab 03/24/18 1612 03/24/18 1620 03/25/18 0535 03/27/18 2102 03/28/18 0336 03/29/18 0240 03/30/18 0349  WBC 4.6  --  5.0 3.8*  --  5.6 5.0  NEUTROABS 3.0  --   --  2.0  --   --   --   HGB 15.0 17.7* 11.6* 12.4  --  11.2* 10.4*  HCT 52.0* 52.0* 40.9 43.0 33.9* 37.5 33.6*  MCV 105.3*  --  106.8* 105.9*  --  103.3* 98.2  PLT 156  --  150 172  --  162 527   Basic Metabolic Panel: Recent Labs  Lab 03/25/18 0535 03/26/18 1039 03/27/18 2102 03/28/18 0336 03/29/18 0240 03/30/18 0349  NA 147* 141 140 141 140 140  K 4.0 3.7 5.1 3.9 4.1 4.1  CL 95* 93* 92* 92* 85* 87*  CO2 41* 41* 42* 43* 47* 44*  GLUCOSE 88 171* 114* 141* 107* 107*  BUN 10 7 <5* <5* 6 10  CREATININE 0.71 0.53 0.63 0.54 0.59 0.62  CALCIUM 9.4 9.0 9.2 9.2 9.4 9.5  MG 1.3*  --   --   --   --   --   PHOS 1.8*  --   --   --   --   --    GFR: Estimated Creatinine Clearance: 45 mL/min (by C-G formula based on SCr of 0.62 mg/dL). Liver Function Tests: Recent Labs  Lab 03/24/18 1612 03/25/18 0535 03/29/18 0240  AST 27 17 19   ALT 10* 9* 11*  ALKPHOS 54 43 42  BILITOT 1.0 0.9 0.6  PROT 8.2* 6.3* 5.8*  ALBUMIN 4.4 3.3* 3.1*   No results for input(s): LIPASE, AMYLASE in the last 168 hours. Recent Labs  Lab 03/24/18 1612 03/28/18 0004 03/29/18 0240  AMMONIA 56* 47* 36*   Coagulation Profile: No results for input(s): INR, PROTIME in the last 168 hours. Cardiac Enzymes: Recent Labs  Lab 03/24/18 2116 03/25/18 0010 03/25/18 0535  TROPONINI <0.03 <0.03 <0.03   BNP (last 3 results) No results for input(s): PROBNP in the last 8760 hours. HbA1C: Recent Labs    03/29/18 0240  HGBA1C 5.7*   CBG: Recent Labs  Lab 03/24/18 1618  GLUCAP 107*   Lipid Profile: No results for input(s): CHOL, HDL, LDLCALC, TRIG, CHOLHDL, LDLDIRECT in the last 72 hours. Thyroid Function Tests: No results for input(s): TSH, T4TOTAL, FREET4, T3FREE, THYROIDAB in the  last 72 hours. Anemia Panel: No results for input(s): VITAMINB12, FOLATE, FERRITIN, TIBC, IRON, RETICCTPCT in the last 72 hours. Urine analysis:    Component Value Date/Time   COLORURINE YELLOW 03/24/2018 1925   APPEARANCEUR HAZY (A) 03/24/2018 1925   LABSPEC 1.024 03/24/2018 1925   PHURINE 6.0 03/24/2018 1925   GLUCOSEU NEGATIVE 03/24/2018 1925   GLUCOSEU NEGATIVE 01/11/2015 1723   HGBUR NEGATIVE 03/24/2018 [place] NEGATIVE 03/24/2018 1925  BILIRUBINUR negative 08/11/2011 1358   KETONESUR 20 (A) 03/24/2018 1925   PROTEINUR 30 (A) 03/24/2018 1925   UROBILINOGEN 0.2 01/11/2015 1723   NITRITE NEGATIVE 03/24/2018 1925   LEUKOCYTESUR MODERATE (A) 03/24/2018 1925   Sepsis Labs: @LABRCNTIP (procalcitonin:4,lacticidven:4)  ) Recent Results (from the past 240 hour(s))  MRSA PCR Screening     Status: None   Collection Time: 03/22/18  8:32 PM  Result Value Ref Range Status   MRSA by PCR NEGATIVE NEGATIVE Final    Comment:        The GeneXpert MRSA Assay (FDA approved for NASAL specimens only), is one component of a comprehensive MRSA colonization surveillance program. It is not intended to diagnose MRSA infection nor to guide or monitor treatment for MRSA infections. Performed at [facility], [place] [address], [place], [place] [address]   Urine culture     Status: None   Collection Time: 03/24/18  7:25 PM  Result Value Ref Range Status   Specimen Description URINE, RANDOM  Final   Special Requests NONE  Final   Culture   Final    NO GROWTH Performed at [facility], [place] [address] [place], [place] [address]    Report Status 03/25/2018 FINAL  Final  MRSA PCR Screening     Status: None   Collection Time: 03/24/18 11:03 PM  Result Value Ref Range Status   MRSA by PCR NEGATIVE NEGATIVE Final    Comment:        The GeneXpert MRSA Assay (FDA approved for NASAL specimens only), is one component of a comprehensive MRSA  colonization surveillance program. It is not intended to diagnose MRSA infection nor to guide or monitor treatment for MRSA infections. Performed at [facility], [place] [address], [place], [place] [address]          Radiology Studies: No results found.      Scheduled Meds: . aspirin EC  81 mg Oral Daily  . citalopram  20 mg Oral Daily  . enoxaparin (LOVENOX) injection  40 mg Subcutaneous Daily  . feeding supplement (ENSURE ENLIVE)  237 mL Oral TID BM  . fluticasone furoate-vilanterol  1 puff Inhalation Daily  . ipratropium-albuterol  3 mL Nebulization BID  . levothyroxine  75 mcg Oral QAC breakfast   Continuous Infusions:   LOS: 4 days    Time spent: 40 minutes    Reis Pienta, Geraldo Docker, MD [facility] Pager [phone]   If 7PM-7AM, please contact night-coverage [email or web address] Password [facility] 03/31/2018, 3:44 PM

## 2018-03-31 NOTE — Progress Notes (Signed)
{  PT ALL NOTES:SATURATION QUALIFICATIONS: (This note is used to comply with regulatory documentation for home oxygen)  Patient Saturations on Room Air at Rest = 88%  Patient Saturations on Room Air while Ambulating = N/A  Patient Saturations on 4 Liters of oxygen while Ambulating = 79%  Please briefly explain why patient needs home oxygen: To maintain oxygen saturations > 90% at rest and when mobilizing.  Ellamae Sia, PT, DPT Acute Rehabilitation Services Pager: [phone]

## 2018-03-31 NOTE — Progress Notes (Signed)
Spoke w RN who stated patient may DC today. Did not receive return call from Dr Sherral Hammers to clarify. Placed resumption orders for [name] and notified [facility] of possible DC.

## 2018-03-31 NOTE — Progress Notes (Signed)
Physical Therapy Treatment Patient Details Name: Jasmine [name] MRN: [id number] DOB: 1946-06-17 Today's Date: 03/31/2018    History of Present Illness Jasmine [name] is a 72yo black female with medical history significant of HTN, COPDon 3-4 L of O2, cor pulmonale, chronic diastolic CHF,  chronic respiratory failure with hypercapnia cocaine and tobacco abuse in the past, comes back to [facility] with AMS related to hypercarbia.     PT Comments    Patient is progressing well towards their physical therapy goals. Patient requested to use a RW for mobility today for increased steadiness (states she has a walker at home) and increased ambulation distance to 200 feet. Patient with desaturation to 79% SpO2 on 4L O2; however, she was still able to carry a conversation during mobility. Increase back to 93% SpO2 with seated break.    Follow Up Recommendations  Home health PT;Supervision - Intermittent     Equipment Recommendations  None recommended by PT    Recommendations for Other Services       Precautions / Restrictions Precautions Precautions: Fall Restrictions Weight Bearing Restrictions: No    Mobility  Bed Mobility               General bed mobility comments: received in recliner  Transfers Overall transfer level: Modified independent Equipment used: None Transfers: Sit to/from Stand Sit to Stand: Modified independent (Device/Increase time)            Ambulation/Gait Ambulation/Gait assistance: Supervision Ambulation Distance (Feet): 200 Feet Assistive device: Rolling walker (2 wheeled) Gait Pattern/deviations: Step-through pattern;Shuffle;Trunk flexed Gait velocity: decreased Gait velocity interpretation: <1.31 ft/sec, indicative of household ambulator General Gait Details: Patient wanting to trial RW for increased steadiness. Cueing for RW proximity, upright posture.     Stairs             Wheelchair Mobility    Modified Rankin (Stroke Patients  Only)       Balance Overall balance assessment: Needs assistance Sitting-balance support: No upper extremity supported;Feet supported Sitting balance-Leahy Scale: Good     Standing balance support: No upper extremity supported Standing balance-Leahy Scale: Fair                              Cognition Arousal/Alertness: Awake/alert Behavior During Therapy: WFL for tasks assessed/performed Overall Cognitive Status: No family/caregiver present to determine baseline cognitive functioning Area of Impairment: Attention;Memory                   Current Attention Level: Selective Memory: Decreased short-term memory                Exercises      General Comments        Pertinent Vitals/Pain Pain Assessment: No/denies pain    Home Living                      Prior Function            PT Goals (current goals can now be found in the care plan section) Acute Rehab PT Goals Patient Stated Goal: hopeful for home soon  Potential to Achieve Goals: Fair Progress towards PT goals: Progressing toward goals    Frequency    Min 3X/week      PT Plan Current plan remains appropriate    Co-evaluation              AM-PAC PT "6 Clicks" Daily Activity  Outcome Measure  Difficulty turning over in bed (including adjusting bedclothes, sheets and blankets)?: A Little Difficulty moving from lying on back to sitting on the side of the bed? : A Little Difficulty sitting down on and standing up from a chair with arms (e.g., wheelchair, bedside commode, etc,.)?: A Little Help needed moving to and from a bed to chair (including a wheelchair)?: A Little Help needed walking in hospital room?: A Little Help needed climbing 3-5 steps with a railing? : A Little 6 Click Score: 18    End of Session Equipment Utilized During Treatment: Gait belt;Oxygen Activity Tolerance: Patient tolerated treatment well Patient left: in chair;with call bell/phone  within reach;with bed alarm set Nurse Communication: Mobility status PT Visit Diagnosis: Other abnormalities of gait and mobility (R26.89);Difficulty in walking, not elsewhere classified (R26.2)     Time: 8811-0315 PT Time Calculation (min) (ACUTE ONLY): 25 min  Charges:  $Gait Training: 23-37 mins                    G Codes:      Jasmine [name], PT, DPT Acute Rehabilitation Services  Pager: [phone]  Jasmine [name] 03/31/2018, 4:56 PM

## 2018-04-01 LAB — BASIC METABOLIC PANEL
ANION GAP: 7 (ref 5–15)
BUN: 15 mg/dL (ref 6–20)
CO2: 42 mmol/L — ABNORMAL HIGH (ref 22–32)
Calcium: 9.4 mg/dL (ref 8.9–10.3)
Chloride: 89 mmol/L — ABNORMAL LOW (ref 101–111)
Creatinine, Ser: 0.56 mg/dL (ref 0.44–1.00)
Glucose, Bld: 95 mg/dL (ref 65–99)
POTASSIUM: 4.6 mmol/L (ref 3.5–5.1)
SODIUM: 138 mmol/L (ref 135–145)

## 2018-04-01 LAB — MAGNESIUM: MAGNESIUM: 1.7 mg/dL (ref 1.7–2.4)

## 2018-04-01 NOTE — Progress Notes (Signed)
Patient's husband placed her on her home BIPAP.

## 2018-04-01 NOTE — Progress Notes (Signed)
RT went into pt room to place pt on bipap for the night, pt had home bipap unit from [facility] in room but not completely set up. RT unaware of how to set home bipap up. RN aware, RT did not mess with unit and placed pt on hospital bipap. RT will continue to monitor.

## 2018-04-01 NOTE — Progress Notes (Signed)
PROGRESS NOTE    Jasmine [name]  OJJ:[id number] DOB: [birth year] DOA: 03/27/2018 PCP: Biagio Borg, MD   Brief Narrative:  72 y.o. BF PMHx COPD, OSA, Chronic Diastolic CHF, Cocaine abuse, Hypothyroidism, Anxiety   Presented w/ confusion and somnolence.  Patient was discharged the day prior after her second admission in the past week under similar circumstances (this now being her third identical admission this month).  She has been presenting to the ED with confusion, is found to be hypercarbic, and then mental status clears with BiPAP.     In the ED an acute infection w/u was negative, and a VBG revealed a normal pH with a pCO2 of 89.     Subjective: 6/13 A/O x4, negative CP, negative abdominal pain.  Positive chronic S OB (on 4 L O2 via [place]).  Although able to complete exam patient again picked up phone and began to have conversation on phone and requested I stand around and wait for her to complete her conversation.     Assessment & Plan:   Principal Problem:   Acute encephalopathy Active Problems:   Depression with anxiety   Essential hypertension   COPD (chronic obstructive pulmonary disease) (HCC)   Hypothyroidism following radioiodine therapy   Chronic diastolic congestive heart failure (HCC)   Acute on chronic respiratory failure with hypercapnia (HCC)  Acute encephalopathy - Multifactorial to include noncompliance, substance abuse (cocaine use), benzodiazepine abuse, OSA not on CPAP/BiPAP -O2 to maintain SPO2 89 to 93% -BiPAP at night.  NOTE BiPAP has been arranged for patient at discharge sitting in room.  BiPAP machine to be used tonight and settings adjusted in anticipation of A.m. discharge -Resolved  Acute on chronic respiratory failure with hypoxia and hypercapnia /COPD exacerbation  -See acute encephalopathy   -Patient Saturations on Room Air at Rest = 88% Patient Saturations on Room Air while Ambulating = N/A Patient Saturations on 4 Liters of oxygen while  Ambulating = 79%  2 L O2 while sedentary increase to 4 L O2 with exertion.  Titrate O2 to maintain SPO2 89 to 93% -Patient currently on home O2.  Chronic diastolic CHF - Strict in and out since admission +1.5 L -Daily weight  Filed Weights   03/30/18 0437 03/31/18 0401 04/01/18 0338  Weight: 118 lb 6.2 oz (53.7 kg) 118 lb 6.2 oz (53.7 kg) 121 lb 0.5 oz (54.9 kg)  - Transfuse for hemoglobin<8  Hypothyroidism - 6/6 TSH WNL -Synthroid 75 mcg daily   Anxiety  -Celexa 20 mg daily  -Benzodiazepine discontinued secondary to patient overuse/abuse    Polysubstance abuse -  6/5 UDS positive benzodiazepine: On Xanax as outpatient -Previous UDS in March 2019+ cocaine  Macrocytosis - EtOH abuse? -B12 normal -Folate pending     DVT prophylaxis: Lovenox Code Status: Full Family Communication: None Disposition Plan: Discharge in A.m.   Consultants:  None  Procedures/Significant Events:     I have personally reviewed and interpreted all radiology studies and my findings are as above.  VENTILATOR SETTINGS:    Cultures   Antimicrobials: Anti-infectives (From admission, onward)   None       Devices    LINES / TUBES:      Continuous Infusions:   Objective: Vitals:   03/31/18 2002 03/31/18 2341 04/01/18 0338 04/01/18 0833  BP: 112/63 127/61 101/65 120/60  Pulse: 79 90 74 83  Resp: 13 16 18 13   Temp: 98.8 F (37.1 C) 98.6 F (37 C) 98.1 F (36.7 C) 97.9 F (36.6  C)  TempSrc: Oral Oral Axillary Axillary  SpO2: 100% 99% 96% 99%  Weight:   121 lb 0.5 oz (54.9 kg)   Height:        Intake/Output Summary (Last 24 hours) at 04/01/2018 0915 Last data filed at 03/31/2018 1900 Gross per 24 hour  Intake 480 ml  Output -  Net 480 ml   Filed Weights   03/30/18 0437 03/31/18 0401 04/01/18 0338  Weight: 118 lb 6.2 oz (53.7 kg) 118 lb 6.2 oz (53.7 kg) 121 lb 0.5 oz (54.9 kg)    Physical Exam:  General: A/O x4, positive chronic respiratory distress,  cachectic Neck:  Negative scars, masses, torticollis, lymphadenopathy, JVD Lungs: diffuse poor air movement throughout, positive mild expiratory wheeze.  Negative crackles Cardiovascular: Regular rate and rhythm without murmur gallop or rub normal S1 and S2 Abdomen: negative abdominal pain, nondistended, positive soft, bowel sounds, no rebound, no ascites, no appreciable mass Extremities: No significant cyanosis, clubbing, or edema bilateral lower extremities Skin: Negative rashes, lesions, ulcers Psychiatric:  Negative depression, negative anxiety, negative fatigue, negative mania, EXTREMELY POOR understanding of multiple medical problems (refuses to engage medical staff in treatment plan) Central nervous system:  Cranial nerves II through XII intact, tongue/uvula midline, all extremities muscle strength 5/5, sensation intact throughout, finger negative dysarthria, negative expressive aphasia, negative receptive aphasia.       Data Reviewed: Care during the described time interval was provided by me .  I have reviewed this patient's available data, including medical history, events of note, physical examination, and all test results as part of my evaluation.   CBC: Recent Labs  Lab 03/27/18 2102 03/28/18 0336 03/29/18 0240 03/30/18 0349  WBC 3.8*  --  5.6 5.0  NEUTROABS 2.0  --   --   --   HGB 12.4  --  11.2* 10.4*  HCT 43.0 33.9* 37.5 33.6*  MCV 105.9*  --  103.3* 98.2  PLT 172  --  162 093   Basic Metabolic Panel: Recent Labs  Lab 03/27/18 2102 03/28/18 0336 03/29/18 0240 03/30/18 0349 04/01/18 0328  NA 140 141 140 140 138  K 5.1 3.9 4.1 4.1 4.6  CL 92* 92* 85* 87* 89*  CO2 42* 43* 47* 44* 42*  GLUCOSE 114* 141* 107* 107* 95  BUN <5* <5* 6 10 15   CREATININE 0.63 0.54 0.59 0.62 0.56  CALCIUM 9.2 9.2 9.4 9.5 9.4  MG  --   --   --   --  1.7   GFR: Estimated Creatinine Clearance: 48.7 mL/min (by C-G formula based on SCr of 0.56 mg/dL). Liver Function Tests: Recent  Labs  Lab 03/29/18 0240  AST 19  ALT 11*  ALKPHOS 42  BILITOT 0.6  PROT 5.8*  ALBUMIN 3.1*   No results for input(s): LIPASE, AMYLASE in the last 168 hours. Recent Labs  Lab 03/28/18 0004 03/29/18 0240  AMMONIA 47* 36*   Coagulation Profile: No results for input(s): INR, PROTIME in the last 168 hours. Cardiac Enzymes: No results for input(s): CKTOTAL, CKMB, CKMBINDEX, TROPONINI in the last 168 hours. BNP (last 3 results) No results for input(s): PROBNP in the last 8760 hours. HbA1C: No results for input(s): HGBA1C in the last 72 hours. CBG: No results for input(s): GLUCAP in the last 168 hours. Lipid Profile: No results for input(s): CHOL, HDL, LDLCALC, TRIG, CHOLHDL, LDLDIRECT in the last 72 hours. Thyroid Function Tests: No results for input(s): TSH, T4TOTAL, FREET4, T3FREE, THYROIDAB in the last 72 hours. Anemia Panel:  No results for input(s): VITAMINB12, FOLATE, FERRITIN, TIBC, IRON, RETICCTPCT in the last 72 hours. Urine analysis:    Component Value Date/Time   COLORURINE YELLOW 03/24/2018 1925   APPEARANCEUR HAZY (A) 03/24/2018 1925   LABSPEC 1.024 03/24/2018 1925   PHURINE 6.0 03/24/2018 1925   GLUCOSEU NEGATIVE 03/24/2018 1925   GLUCOSEU NEGATIVE 01/11/2015 1723   HGBUR NEGATIVE 03/24/2018 1925   BILIRUBINUR NEGATIVE 03/24/2018 1925   BILIRUBINUR negative 08/11/2011 1358   KETONESUR 20 (A) 03/24/2018 1925   PROTEINUR 30 (A) 03/24/2018 1925   UROBILINOGEN 0.2 01/11/2015 1723   NITRITE NEGATIVE 03/24/2018 1925   LEUKOCYTESUR MODERATE (A) 03/24/2018 1925   Sepsis Labs: @LABRCNTIP (procalcitonin:4,lacticidven:4)  ) Recent Results (from the past 240 hour(s))  MRSA PCR Screening     Status: None   Collection Time: 03/22/18  8:32 PM  Result Value Ref Range Status   MRSA by PCR NEGATIVE NEGATIVE Final    Comment:        The GeneXpert MRSA Assay (FDA approved for NASAL specimens only), is one component of a comprehensive MRSA colonization surveillance  program. It is not intended to diagnose MRSA infection nor to guide or monitor treatment for MRSA infections. Performed at [facility], [place] [address] [place], [place] [address]   Urine culture     Status: None   Collection Time: 03/24/18  7:25 PM  Result Value Ref Range Status   Specimen Description URINE, RANDOM  Final   Special Requests NONE  Final   Culture   Final    NO GROWTH Performed at [facility], [place] [address], [place], [place] [address]    Report Status 03/25/2018 FINAL  Final  MRSA PCR Screening     Status: None   Collection Time: 03/24/18 11:03 PM  Result Value Ref Range Status   MRSA by PCR NEGATIVE NEGATIVE Final    Comment:        The GeneXpert MRSA Assay (FDA approved for NASAL specimens only), is one component of a comprehensive MRSA colonization surveillance program. It is not intended to diagnose MRSA infection nor to guide or monitor treatment for MRSA infections. Performed at [facility], [place] [address] [place], [place] [address]          Radiology Studies: No results found.      Scheduled Meds: . aspirin EC  81 mg Oral Daily  . citalopram  20 mg Oral Daily  . enoxaparin (LOVENOX) injection  40 mg Subcutaneous Daily  . feeding supplement (ENSURE ENLIVE)  237 mL Oral TID BM  . fluticasone furoate-vilanterol  1 puff Inhalation Daily  . ipratropium-albuterol  3 mL Nebulization BID  . levothyroxine  75 mcg Oral QAC breakfast   Continuous Infusions:   LOS: 5 days    Time spent: 40 minutes    Fleur Audino, Geraldo Docker, MD [facility] Pager [phone]   If 7PM-7AM, please contact night-coverage [email or web address] Password [email or web address] 04/01/2018, 9:15 AM

## 2018-04-01 NOTE — Progress Notes (Signed)
Patient's husband at bedside. Patient reeducated on how to apply/bipap by this nurse and [facility] representative. Husband verbalized understanding.

## 2018-04-02 DIAGNOSIS — J441 Chronic obstructive pulmonary disease with (acute) exacerbation: Secondary | ICD-10-CM

## 2018-04-02 DIAGNOSIS — F419 Anxiety disorder, unspecified: Secondary | ICD-10-CM

## 2018-04-02 DIAGNOSIS — E039 Hypothyroidism, unspecified: Secondary | ICD-10-CM

## 2018-04-02 LAB — BASIC METABOLIC PANEL
Anion gap: 3 — ABNORMAL LOW (ref 5–15)
BUN: 15 mg/dL (ref 6–20)
CALCIUM: 9 mg/dL (ref 8.9–10.3)
CO2: 40 mmol/L — AB (ref 22–32)
Chloride: 94 mmol/L — ABNORMAL LOW (ref 101–111)
Creatinine, Ser: 0.58 mg/dL (ref 0.44–1.00)
Glucose, Bld: 92 mg/dL (ref 65–99)
Potassium: 4.6 mmol/L (ref 3.5–5.1)
SODIUM: 137 mmol/L (ref 135–145)

## 2018-04-02 LAB — MAGNESIUM: MAGNESIUM: 1.7 mg/dL (ref 1.7–2.4)

## 2018-04-02 MED ORDER — IPRATROPIUM-ALBUTEROL 0.5-2.5 (3) MG/3ML IN SOLN: 3.0000 mL | Freq: Two times a day (BID) | RESPIRATORY_TRACT | 0 refills | Status: DC

## 2018-04-02 MED ORDER — TRAMADOL HCL 50 MG PO TABS: 50.0000 mg | ORAL_TABLET | Freq: Four times a day (QID) | ORAL | 0 refills | Status: DC | PRN

## 2018-04-02 NOTE — Discharge Summary (Signed)
Physician Discharge Summary  LAQUITA HARLAN ZOX:[id number] DOB: 07/28/1946 DOA: 03/27/2018  PCP: Biagio Borg, MD  Admit date: 03/27/2018 Discharge date: 04/02/2018  Time spent:40 minutes  Recommendations for Outpatient Follow-up:   Acute encephalopathy - Multifactorial to include noncompliance, substance abuse (cocaine use), benzodiazepine abuse, OSA not on CPAP/BiPAP -O2 to maintain SPO2 89 to 93% -BiPAP at night.  NOTE BiPAP has been arranged for patient at discharge sitting in room.  BiPAP machine to be used tonight and settings adjusted in anticipation of A.m. discharge -Resolved   Acute on chronic respiratory failure with hypoxia and hypercapnia /COPD exacerbation  -See acute encephalopathy   -Patient Saturations on Room Air at Rest = 88% Patient Saturations on Room Air while Ambulating = N/A Patient Saturations on 4 Liters of oxygen while Ambulating = 79%             2 L O2 while sedentary increase to 4 L O2 with exertion.  Titrate O2 to maintain SPO2 89 to 93% -Patient currently on home O2. - Schedule follow-up appointment in 1 to 2 weeks with Dr.James Winfield Cunas of care acute encephalopathy, acute on chronic respiratory failure with hypoxia and hypercapnia, COPD exacerbation, OSA   Chronic diastolic CHF - Strict in and out since admission +1.5 L -Daily weight       Filed Weights    03/30/18 0437 03/31/18 0401 04/01/18 0338  Weight: 118 lb 6.2 oz (53.7 kg) 118 lb 6.2 oz (53.7 kg) 121 lb 0.5 oz (54.9 kg)  - Transfuse for hemoglobin<8   Hypothyroidism - 6/6 TSH WNL -Synthroid 75 mcg daily   Anxiety  -Celexa 20 mg daily  -Benzodiazepine discontinued secondary to patient overuse/abuse    Polysubstance abuse -  6/5 UDS positive benzodiazepine: On Xanax as outpatient -Previous UDS in March 2019+ cocaine   Macrocytosis - EtOH abuse? -B12 normal     Discharge Diagnoses:  Principal Problem:   Acute encephalopathy Active Problems:   Depression with anxiety  Essential hypertension   COPD (chronic obstructive pulmonary disease) (HCC)   Hypothyroidism following radioiodine therapy   Chronic diastolic congestive heart failure (HCC)   Acute on chronic respiratory failure with hypercapnia (HCC)   Discharge Condition: Stable  Diet recommendation: Heart healthy  Filed Weights   03/31/18 0401 04/01/18 0338 04/02/18 0500  Weight: 118 lb 6.2 oz (53.7 kg) 121 lb 0.5 oz (54.9 kg) 120 lb 13 oz (54.8 kg)    History of present illness:  72 y.o. BF PMHx COPD, OSA, Chronic Diastolic CHF, Cocaine abuse, Hypothyroidism, Anxiety    Presented w/ confusion and somnolence.  Patient was discharged the day prior after her second admission in the past week under similar circumstances (this now being her third identical admission this month).  She has been presenting to the ED with confusion, is found to be hypercarbic, and then mental status clears with BiPAP.     In the ED an acute infection w/u was negative, and a VBG revealed a normal pH with a pCO2 of 89.   During his hospitalization patient was treated for acute on chronic respiratory failure with hypoxia and hypercapnia.  Patient had been sent home on multiple occasions without BiPAP consequently had rapidly decompensated.  In addition patient was abusing/overusing benzodiazepine as well as using cocaine.  Patient placed on BiPAP, benzodiazepines discontinued patient respiratory status now stable.  Stable for discharge.    Discharge Exam: Vitals:   04/02/18 0656 04/02/18 0849 04/02/18 0854 04/02/18 1100  BP: 114/65 112/73  119/60  Pulse: 70 78  86  Resp: 16 20  12   Temp: 98.4 F (36.9 C)   (!) 97.5 F (36.4 C)  TempSrc: Oral   Oral  SpO2: 100% 97% 97% 97%  Weight:      Height:        General: A/O x4, positive chronic respiratory distress, cachectic Neck:  Negative scars, masses, torticollis, lymphadenopathy, JVD Lungs: diffuse poor air movement throughout, positive mild expiratory wheeze.   Negative crackles Cardiovascular: Regular rate and rhythm without murmur gallop or rub normal S1 and S2    Discharge Instructions  Discharge Instructions    AMB Referral to [place] [facility]   Complete by:  As directed    Please assign to [place] for COPD, CHF. Please assess need for social worker referral for potential transportation concerns (currently denies transportation issues). PCP office Velora Heckler [place] listed as doing toc). Written consent obtained. Currently at [facility]. Please call with questions. Thanks. Marthenia Rolling, [place], RN,BSN-THN [facility] [phone]   Reason for consult:  Please assign to [facility] RNCM   Diagnoses of:   COPD/ Pneumonia Heart Failure     Expected date of contact:  1-3 days (reserved for hospital discharges)     Allergies as of 04/02/2018      Reactions   Alendronate Sodium Other (See Comments)   Patient does not remember this reaction      Medication List    STOP taking these medications   ALPRAZolam 0.25 MG tablet Commonly known as:  XANAX   furosemide 40 MG tablet Commonly known as:  LASIX     TAKE these medications   albuterol (2.5 MG/3ML) 0.083% nebulizer solution Commonly known as:  PROVENTIL Take 3 mLs (2.5 mg total) by nebulization every 4 (four) hours as needed for wheezing.   albuterol 108 (90 Base) MCG/ACT inhaler Commonly known as:  PROAIR HFA Inhale 1-2 puffs into the lungs every 6 (six) hours as needed for wheezing or shortness of breath.   aspirin 81 MG EC tablet Take 1 tablet (81 mg total) by mouth daily.   citalopram 20 MG tablet Commonly known as:  CELEXA TAKE 1 TABLET BY MOUTH DAILY What changed:    how much to take  how to take this  when to take this   feeding supplement (ENSURE ENLIVE) Liqd Take 237 mLs by mouth 3 (three) times daily between meals.   fluticasone furoate-vilanterol 200-25 MCG/INH Aepb Commonly known as:  BREO ELLIPTA Inhale 1 puff into the  lungs daily.   ipratropium-albuterol 0.5-2.5 (3) MG/3ML Soln Commonly known as:  DUONEB Take 3 mLs by nebulization 2 (two) times daily. What changed:    when to take this  reasons to take this   levothyroxine 75 MCG tablet Commonly known as:  SYNTHROID, LEVOTHROID TAKE 1 TABLET BY MOUTH DAILY BEFORE BREAKFAST.   OXYGEN Inhale 4 L into the lungs continuous.   traMADol 50 MG tablet Commonly known as:  ULTRAM Take 1 tablet (50 mg total) by mouth every 6 (six) hours as needed for moderate pain.      Allergies  Allergen Reactions  . Alendronate Sodium Other (See Comments)    Patient does not remember this reaction   Follow-up Information    [facility].   Specialty:  Home Health Services Why:  For home health and home equipment Contact information: [address] [place] [address] [phone]  Biagio Borg, MD. Schedule an appointment as soon as possible for a visit in 2 day(s).   Specialties:  Internal Medicine, Radiology Why:  Schedule follow-up appointment in 1 to 2 weeks with Dr.James Winfield Cunas of care acute encephalopathy, acute on chronic respiratory failure with hypoxia and hypercapnia, COPD exacerbation, OSA. Appointment: April 14, 2018 @ 2pm Contact information: [place] [address] [phone]            The results of significant diagnostics from this hospitalization (including imaging, microbiology, ancillary and laboratory) are listed below for reference.    Significant Diagnostic Studies: Dg Chest 2 View  Result Date: 03/24/2018 CLINICAL DATA:  Shortness of breath, COPD, lung cancer EXAM: CHEST - 2 VIEW COMPARISON:  Chest radiographs dated 03/22/2018. CT chest dated 12/16/2017. FINDINGS: Platelike opacity in the right upper lobe, corresponding to radiation changes on prior CT. No focal consolidation.  No pleural effusion or pneumothorax. The heart is normal in size. Visualized  osseous structures are within normal limits. IMPRESSION: No evidence of acute cardiopulmonary disease. Electronically Signed   By: Julian Hy M.D.   On: 03/24/2018 16:55   Dg Chest 2 View  Result Date: 03/22/2018 CLINICAL DATA:  72 year old female with altered mental status, shortness of breath. EXAM: CHEST - 2 VIEW COMPARISON:  Chest radiographs 01/08/2018 and earlier. Chest CT 12/16/2017 FINDINGS: Stable lung volumes. Chronic scarring along the superior aspect of the minor fissure is unchanged. Centrilobular emphysema. No pneumothorax, pulmonary edema, pleural effusion or acute pulmonary opacity. Tortuous thoracic aorta. Other mediastinal contours are within normal limits. Visualized tracheal air column is within normal limits. No acute osseous abnormality identified. Negative visible bowel gas pattern. IMPRESSION: Emphysema (ICD10-J43.9). No acute cardiopulmonary abnormality. Electronically Signed   By: Genevie Ann M.D.   On: 03/22/2018 14:46   Ct Head Wo Contrast  Result Date: 03/24/2018 CLINICAL DATA:  Altered level of consciousness EXAM: CT HEAD WITHOUT CONTRAST TECHNIQUE: Contiguous axial images were obtained from the base of the skull through the vertex without intravenous contrast. COMPARISON:  01/07/2018 FINDINGS: Brain: No acute intracranial abnormality. Specifically, no hemorrhage, hydrocephalus, mass lesion, acute infarction, or significant intracranial injury. Vascular: No hyperdense vessel or unexpected calcification. Skull: No acute calvarial abnormality. Sinuses/Orbits: Visualized paranasal sinuses and mastoids clear. Orbital soft tissues unremarkable. Other: None IMPRESSION: No acute intracranial abnormality. Electronically Signed   By: Rolm Baptise M.D.   On: 03/24/2018 19:13   Dg Chest Port 1 View  Result Date: 03/27/2018 CLINICAL DATA:  Acute onset of shortness of breath and generalized weakness. Decreased oral intake. EXAM: PORTABLE CHEST 1 VIEW COMPARISON:  Chest radiograph  performed 03/24/2018 FINDINGS: The lungs are well-aerated. Mild chronic peribronchial thickening is noted. There is no evidence of pleural effusion or pneumothorax. Scarring is again noted at the right upper lobe. The cardiomediastinal silhouette is within normal limits. No acute osseous abnormalities are seen. IMPRESSION: Mild chronic peribronchial thickening noted. Scarring again noted at the right upper lobe. Lungs otherwise grossly clear. Electronically Signed   By: Garald Balding M.D.   On: 03/27/2018 21:34    Microbiology: Recent Results (from the past 240 hour(s))  Urine culture     Status: None   Collection Time: 03/24/18  7:25 PM  Result Value Ref Range Status   Specimen Description URINE, RANDOM  Final   Special Requests NONE  Final   Culture   Final    NO GROWTH Performed at [facility], [address],  [place], [place] [address]    Report Status 03/25/2018 FINAL  Final  MRSA PCR Screening     Status: None   Collection Time: 03/24/18 11:03 PM  Result Value Ref Range Status   MRSA by PCR NEGATIVE NEGATIVE Final    Comment:        The GeneXpert MRSA Assay (FDA approved for NASAL specimens only), is one component of a comprehensive MRSA colonization surveillance program. It is not intended to diagnose MRSA infection nor to guide or monitor treatment for MRSA infections. Performed at [facility], [place] [address], [place], [place] [address]      Labs: Basic Metabolic Panel: Recent Labs  Lab 03/28/18 0336 03/29/18 0240 03/30/18 0349 04/01/18 0328 04/02/18 0407  NA 141 140 140 138 137  K 3.9 4.1 4.1 4.6 4.6  CL 92* 85* 87* 89* 94*  CO2 43* 47* 44* 42* 40*  GLUCOSE 141* 107* 107* 95 92  BUN <5* 6 10 15 15   CREATININE 0.54 0.59 0.62 0.56 0.58  CALCIUM 9.2 9.4 9.5 9.4 9.0  MG  --   --   --  1.7 1.7   Liver Function Tests: Recent Labs  Lab 03/29/18 0240  AST 19  ALT 11*  ALKPHOS 42  BILITOT 0.6  PROT 5.8*  ALBUMIN 3.1*   No results for  input(s): LIPASE, AMYLASE in the last 168 hours. Recent Labs  Lab 03/28/18 0004 03/29/18 0240  AMMONIA 47* 36*   CBC: Recent Labs  Lab 03/27/18 2102 03/28/18 0336 03/29/18 0240 03/30/18 0349  WBC 3.8*  --  5.6 5.0  NEUTROABS 2.0  --   --   --   HGB 12.4  --  11.2* 10.4*  HCT 43.0 33.9* 37.5 33.6*  MCV 105.9*  --  103.3* 98.2  PLT 172  --  162 176   Cardiac Enzymes: No results for input(s): CKTOTAL, CKMB, CKMBINDEX, TROPONINI in the last 168 hours. BNP: BNP (last 3 results) Recent Labs    03/27/18 2102  BNP 26.4    ProBNP (last 3 results) No results for input(s): PROBNP in the last 8760 hours.  CBG: No results for input(s): GLUCAP in the last 168 hours.     Signed:  Dia Crawford, MD [facility] [phone] pager

## 2018-04-05 ENCOUNTER — Other Ambulatory Visit: Payer: Self-pay

## 2018-04-05 DIAGNOSIS — R911 Solitary pulmonary nodule: Secondary | ICD-10-CM | POA: Diagnosis not present

## 2018-04-05 DIAGNOSIS — J449 Chronic obstructive pulmonary disease, unspecified: Secondary | ICD-10-CM | POA: Diagnosis not present

## 2018-04-05 DIAGNOSIS — J9611 Chronic respiratory failure with hypoxia: Secondary | ICD-10-CM | POA: Diagnosis not present

## 2018-04-05 DIAGNOSIS — R300 Dysuria: Secondary | ICD-10-CM | POA: Diagnosis not present

## 2018-04-05 NOTE — Patient Outreach (Addendum)
[facility]) Care Management  04/05/2018  Jasmine [name] 12/05/1945 [id number]   Subjective: " It is really better".  Objective: none  Assessment: Referral received 03/29/18. Admitted with acute encephalopathy: per chart multifactorial to include non compliance, substance abuse(cocaine use), benzodiazepine abuse, OSA.   RNCM called to follow up. Client alert, oriented to self place, situation. 72 year old with history of Client denies difficulty breathing, no coughing, no sputum production.  Client denies and difficulty at this time. She reports she continues to use oxygen continuous. Client is not clear if home health agency is scheduled to come out. She denies any questions or concerns at this time.  Plan: care coordination-RNCM to follow up with [facility] regarding start of care. home visit scheduled.  Thea Silversmith, RN, MSN, [place] Coordinator Cell: [phone]  Addendum: Client's primary care provided listed as completing Transition of care.

## 2018-04-06 DIAGNOSIS — F419 Anxiety disorder, unspecified: Secondary | ICD-10-CM | POA: Diagnosis not present

## 2018-04-06 DIAGNOSIS — J449 Chronic obstructive pulmonary disease, unspecified: Secondary | ICD-10-CM | POA: Diagnosis not present

## 2018-04-06 DIAGNOSIS — I110 Hypertensive heart disease with heart failure: Secondary | ICD-10-CM | POA: Diagnosis not present

## 2018-04-06 DIAGNOSIS — I2781 Cor pulmonale (chronic): Secondary | ICD-10-CM | POA: Diagnosis not present

## 2018-04-06 DIAGNOSIS — G9341 Metabolic encephalopathy: Secondary | ICD-10-CM | POA: Diagnosis not present

## 2018-04-06 DIAGNOSIS — I5030 Unspecified diastolic (congestive) heart failure: Secondary | ICD-10-CM | POA: Diagnosis not present

## 2018-04-06 DIAGNOSIS — M810 Age-related osteoporosis without current pathological fracture: Secondary | ICD-10-CM | POA: Diagnosis not present

## 2018-04-06 DIAGNOSIS — F329 Major depressive disorder, single episode, unspecified: Secondary | ICD-10-CM | POA: Diagnosis not present

## 2018-04-06 DIAGNOSIS — J9612 Chronic respiratory failure with hypercapnia: Secondary | ICD-10-CM | POA: Diagnosis not present

## 2018-04-07 ENCOUNTER — Telehealth: Payer: Self-pay

## 2018-04-07 DIAGNOSIS — M810 Age-related osteoporosis without current pathological fracture: Secondary | ICD-10-CM | POA: Diagnosis not present

## 2018-04-07 DIAGNOSIS — F419 Anxiety disorder, unspecified: Secondary | ICD-10-CM | POA: Diagnosis not present

## 2018-04-07 DIAGNOSIS — F329 Major depressive disorder, single episode, unspecified: Secondary | ICD-10-CM | POA: Diagnosis not present

## 2018-04-07 DIAGNOSIS — I5030 Unspecified diastolic (congestive) heart failure: Secondary | ICD-10-CM | POA: Diagnosis not present

## 2018-04-07 DIAGNOSIS — I110 Hypertensive heart disease with heart failure: Secondary | ICD-10-CM | POA: Diagnosis not present

## 2018-04-07 DIAGNOSIS — J9612 Chronic respiratory failure with hypercapnia: Secondary | ICD-10-CM | POA: Diagnosis not present

## 2018-04-07 DIAGNOSIS — J449 Chronic obstructive pulmonary disease, unspecified: Secondary | ICD-10-CM | POA: Diagnosis not present

## 2018-04-07 DIAGNOSIS — I2781 Cor pulmonale (chronic): Secondary | ICD-10-CM | POA: Diagnosis not present

## 2018-04-07 DIAGNOSIS — G9341 Metabolic encephalopathy: Secondary | ICD-10-CM | POA: Diagnosis not present

## 2018-04-07 NOTE — Telephone Encounter (Signed)
Called Cory, LVM with verbal orders  Copied from [place] [phone]. Topic: Inquiry >> Apr 06, 2018  2:32 PM Oliver Pila B wrote: Reason for CRM: [facility] called to get verbals for home nursing, call [phone]

## 2018-04-08 DIAGNOSIS — I2781 Cor pulmonale (chronic): Secondary | ICD-10-CM | POA: Diagnosis not present

## 2018-04-08 DIAGNOSIS — F329 Major depressive disorder, single episode, unspecified: Secondary | ICD-10-CM | POA: Diagnosis not present

## 2018-04-08 DIAGNOSIS — J9612 Chronic respiratory failure with hypercapnia: Secondary | ICD-10-CM | POA: Diagnosis not present

## 2018-04-08 DIAGNOSIS — F419 Anxiety disorder, unspecified: Secondary | ICD-10-CM | POA: Diagnosis not present

## 2018-04-08 DIAGNOSIS — G9341 Metabolic encephalopathy: Secondary | ICD-10-CM | POA: Diagnosis not present

## 2018-04-08 DIAGNOSIS — M810 Age-related osteoporosis without current pathological fracture: Secondary | ICD-10-CM | POA: Diagnosis not present

## 2018-04-08 DIAGNOSIS — I110 Hypertensive heart disease with heart failure: Secondary | ICD-10-CM | POA: Diagnosis not present

## 2018-04-08 DIAGNOSIS — J449 Chronic obstructive pulmonary disease, unspecified: Secondary | ICD-10-CM | POA: Diagnosis not present

## 2018-04-08 DIAGNOSIS — I5030 Unspecified diastolic (congestive) heart failure: Secondary | ICD-10-CM | POA: Diagnosis not present

## 2018-04-09 ENCOUNTER — Telehealth: Payer: Self-pay | Admitting: Internal Medicine

## 2018-04-09 DIAGNOSIS — M810 Age-related osteoporosis without current pathological fracture: Secondary | ICD-10-CM | POA: Diagnosis not present

## 2018-04-09 DIAGNOSIS — F329 Major depressive disorder, single episode, unspecified: Secondary | ICD-10-CM | POA: Diagnosis not present

## 2018-04-09 DIAGNOSIS — G9341 Metabolic encephalopathy: Secondary | ICD-10-CM | POA: Diagnosis not present

## 2018-04-09 DIAGNOSIS — I5030 Unspecified diastolic (congestive) heart failure: Secondary | ICD-10-CM | POA: Diagnosis not present

## 2018-04-09 DIAGNOSIS — J449 Chronic obstructive pulmonary disease, unspecified: Secondary | ICD-10-CM | POA: Diagnosis not present

## 2018-04-09 DIAGNOSIS — I110 Hypertensive heart disease with heart failure: Secondary | ICD-10-CM | POA: Diagnosis not present

## 2018-04-09 DIAGNOSIS — I2781 Cor pulmonale (chronic): Secondary | ICD-10-CM | POA: Diagnosis not present

## 2018-04-09 DIAGNOSIS — F419 Anxiety disorder, unspecified: Secondary | ICD-10-CM | POA: Diagnosis not present

## 2018-04-09 DIAGNOSIS — J9612 Chronic respiratory failure with hypercapnia: Secondary | ICD-10-CM | POA: Diagnosis not present

## 2018-04-09 NOTE — Telephone Encounter (Signed)
Copied from [place]. Topic: General - Other >> Apr 09, 2018  4:03 PM Margot Ables wrote: Reason for CRM: requesting orders to see pt for OT 1 week 1, 2 week 2, 1 week 1. Ritu has a secure VM and ok to leave detailed message

## 2018-04-12 ENCOUNTER — Telehealth: Payer: Self-pay | Admitting: Internal Medicine

## 2018-04-12 NOTE — Telephone Encounter (Signed)
Verbal orders given via VM 

## 2018-04-12 NOTE — Telephone Encounter (Signed)
Copied from [place]. Topic: Quick Communication - See Telephone Encounter >> Apr 12, 2018  1:41 PM Hewitt Shorts wrote: Francesco Sor is calling from home health needing to get verbal orders for a social worker  Best number for [employer] [phone]

## 2018-04-12 NOTE — Telephone Encounter (Signed)
Ok for verbal 

## 2018-04-13 ENCOUNTER — Telehealth: Payer: Self-pay | Admitting: Internal Medicine

## 2018-04-13 DIAGNOSIS — I110 Hypertensive heart disease with heart failure: Secondary | ICD-10-CM | POA: Diagnosis not present

## 2018-04-13 DIAGNOSIS — J449 Chronic obstructive pulmonary disease, unspecified: Secondary | ICD-10-CM | POA: Diagnosis not present

## 2018-04-13 DIAGNOSIS — F329 Major depressive disorder, single episode, unspecified: Secondary | ICD-10-CM | POA: Diagnosis not present

## 2018-04-13 DIAGNOSIS — M810 Age-related osteoporosis without current pathological fracture: Secondary | ICD-10-CM | POA: Diagnosis not present

## 2018-04-13 DIAGNOSIS — I2781 Cor pulmonale (chronic): Secondary | ICD-10-CM | POA: Diagnosis not present

## 2018-04-13 DIAGNOSIS — F419 Anxiety disorder, unspecified: Secondary | ICD-10-CM | POA: Diagnosis not present

## 2018-04-13 DIAGNOSIS — I5030 Unspecified diastolic (congestive) heart failure: Secondary | ICD-10-CM | POA: Diagnosis not present

## 2018-04-13 DIAGNOSIS — J9612 Chronic respiratory failure with hypercapnia: Secondary | ICD-10-CM | POA: Diagnosis not present

## 2018-04-13 DIAGNOSIS — G9341 Metabolic encephalopathy: Secondary | ICD-10-CM | POA: Diagnosis not present

## 2018-04-13 NOTE — Telephone Encounter (Signed)
Called pt no answer LMOM w/MD response../lmb 

## 2018-04-13 NOTE — Telephone Encounter (Signed)
Called Jerilynn back no answer LMOM w/MD response.Marland KitchenJohny Chess

## 2018-04-13 NOTE — Telephone Encounter (Signed)
Ok for verbals 

## 2018-04-13 NOTE — Telephone Encounter (Signed)
Copied from [place] [phone]. Topic: Quick Communication - See Telephone Encounter >> Apr 13, 2018 11:36 AM Bea Graff, NT wrote: CRM for notification. See Telephone encounter for: 04/13/18. Candace Gallus with [facility] is calling requesting PT verbal orders with a frequency of 1 time a week for 1 week, 2 times a week for 1 week and 1 times a week for 5 weeks. CB#: [phone]

## 2018-04-14 ENCOUNTER — Telehealth: Payer: Self-pay | Admitting: Pulmonary Disease

## 2018-04-14 ENCOUNTER — Encounter: Payer: Self-pay | Admitting: Internal Medicine

## 2018-04-14 ENCOUNTER — Ambulatory Visit (INDEPENDENT_AMBULATORY_CARE_PROVIDER_SITE_OTHER): Payer: Medicare HMO | Admitting: Internal Medicine

## 2018-04-14 DIAGNOSIS — E039 Hypothyroidism, unspecified: Secondary | ICD-10-CM | POA: Diagnosis not present

## 2018-04-14 DIAGNOSIS — I5030 Unspecified diastolic (congestive) heart failure: Secondary | ICD-10-CM | POA: Diagnosis not present

## 2018-04-14 DIAGNOSIS — J9622 Acute and chronic respiratory failure with hypercapnia: Secondary | ICD-10-CM

## 2018-04-14 DIAGNOSIS — J449 Chronic obstructive pulmonary disease, unspecified: Secondary | ICD-10-CM | POA: Diagnosis not present

## 2018-04-14 DIAGNOSIS — F329 Major depressive disorder, single episode, unspecified: Secondary | ICD-10-CM | POA: Diagnosis not present

## 2018-04-14 DIAGNOSIS — J9612 Chronic respiratory failure with hypercapnia: Secondary | ICD-10-CM | POA: Diagnosis not present

## 2018-04-14 DIAGNOSIS — G9341 Metabolic encephalopathy: Secondary | ICD-10-CM | POA: Diagnosis not present

## 2018-04-14 DIAGNOSIS — Z87891 Personal history of nicotine dependence: Secondary | ICD-10-CM

## 2018-04-14 DIAGNOSIS — I110 Hypertensive heart disease with heart failure: Secondary | ICD-10-CM | POA: Diagnosis not present

## 2018-04-14 DIAGNOSIS — G9340 Encephalopathy, unspecified: Secondary | ICD-10-CM

## 2018-04-14 DIAGNOSIS — Z9981 Dependence on supplemental oxygen: Secondary | ICD-10-CM

## 2018-04-14 DIAGNOSIS — Z7951 Long term (current) use of inhaled steroids: Secondary | ICD-10-CM | POA: Diagnosis not present

## 2018-04-14 DIAGNOSIS — E43 Unspecified severe protein-calorie malnutrition: Secondary | ICD-10-CM | POA: Diagnosis not present

## 2018-04-14 DIAGNOSIS — F419 Anxiety disorder, unspecified: Secondary | ICD-10-CM | POA: Diagnosis not present

## 2018-04-14 DIAGNOSIS — I2781 Cor pulmonale (chronic): Secondary | ICD-10-CM | POA: Diagnosis not present

## 2018-04-14 DIAGNOSIS — J441 Chronic obstructive pulmonary disease with (acute) exacerbation: Secondary | ICD-10-CM

## 2018-04-14 DIAGNOSIS — Z7982 Long term (current) use of aspirin: Secondary | ICD-10-CM

## 2018-04-14 DIAGNOSIS — M810 Age-related osteoporosis without current pathological fracture: Secondary | ICD-10-CM | POA: Diagnosis not present

## 2018-04-14 NOTE — Assessment & Plan Note (Signed)
Resolved, I wonder how much co2 retention contributed to this as well

## 2018-04-14 NOTE — Patient Instructions (Signed)
Please continue all other medications as before, and refills have been done if requested.  Please have the pharmacy call with any other refills you may need.  Please continue your efforts at being more active, low cholesterol diet, and weight control.  Please keep your appointments with your specialists as you may have planned  Please return in 3 months, or sooner if needed 

## 2018-04-14 NOTE — Telephone Encounter (Signed)
lmtcb x1 for [place] with [facility]. Checked fax machine, RA's look at and folder up front unable to locate form.

## 2018-04-14 NOTE — Progress Notes (Signed)
Subjective:    Patient ID: Jasmine [name], female    DOB: August 20, 1946, 72 y.o.   MRN: [id number]  HPI  Here to f/u recent hospn with d/c June 14 to home, overall doing ok, Pt denies chest pain, increased sob or doe, wheezing, orthopnea, PND, increased LE swelling, palpitations, dizziness or syncope.  Pt denies new neurological symptoms such as new headache, or facial or extremity weakness or numbness   Pt denies polydipsia, polyuria, or low sugar symptoms such as weakness or confusion improved with po intake.  Pt states overall good compliance with meds, trying to follow lower cholesterol, diabetic diet, wt overall stable but little exercise however.  Not confused today.  Energy getting better, was able to assist more on her own to get here today, Does wear Bipap at night.  No new compalints. Wt Readings from Last 3 Encounters:  04/14/18 108 lb (49 kg)  04/02/18 120 lb 13 oz (54.8 kg)  03/24/18 120 lb 9.5 oz (54.7 kg)   BP Readings from Last 3 Encounters:  04/14/18 101/68  04/02/18 119/60  03/26/18 (!) 117/104  Als states she is "back with first husband" but no intention to get marrked as she would lose her second husband's [employer] benefits.  Did fall after trip and fall no a rug in the home, hit forehead but seemed lightly, no bruising or swelling or neuro complaints.  No LOC.  Now on 4L [place] as does not seem to tolerate less.  Did lose a front lower incisor with a hard carrot last wk.    Past Medical History:  Diagnosis Date  . ALLERGIC RHINITIS 05/01/2007  . Anxiety   . Arthritis    "hands" (12/28/2015)  . ASTHMA 05/01/2007   "since I was a child"  . Cancer (HCC)    LUNG  . CHF (congestive heart failure) ([place])   . Chronic bronchitis ([place])   . COPD (chronic obstructive pulmonary disease) ([place])   . DEPRESSION 07/21/2008  . DVT (deep venous thrombosis) (HCC) 1960s   LLE  . FATIGUE 10/10/2010  . HYPERTENSION 05/01/2007  . HYPERTHYROIDISM 11/23/2007   Pt endorses having had Graves disease,  possibly radioactive iodine x1, but no thyroidectomy  . HYPOTHYROIDISM 08/23/2009  . LUMBAR RADICULOPATHY, RIGHT 08/25/2008  . On home oxygen therapy    "3-4L; qd; all the time" (12/28/2015)  . OSTEOPOROSIS 05/01/2007  . SHOULDER PAIN, LEFT 08/23/2009  . SINUSITIS, CHRONIC 10/10/2010   Past Surgical History:  Procedure Laterality Date  . APPENDECTOMY    . DILATION AND CURETTAGE OF UTERUS  multiple   history of multiple dialations and curettages and miscarriages, unfortunately never carrying a child to term  . ECTOPIC PREGNANCY SURGERY  "late '60s or early '70s  . ELECTROCARDIOGRAM  06/20/2006  . OOPHORECTOMY Right     reports that she quit smoking about 2 years ago. Her smoking use included cigarettes. She has a 54.00 pack-year smoking history. She has never used smokeless tobacco. She reports that she drinks alcohol. She reports that she has current or past drug history. Drug: "Crack" cocaine. Frequency: 2.00 times per week. family history includes Alcohol abuse in her brother; Asthma in her unknown relative; Diabetes in her brother; Hypertension in her other; Lung cancer in her father; Stroke in her mother; Thyroid disease in her mother. Allergies  Allergen Reactions  . Alendronate Sodium Other (See Comments)    Patient does not remember this reaction   Current Outpatient Medications on File Prior to Visit  Medication  Sig Dispense Refill  . albuterol (PROAIR HFA) 108 (90 Base) MCG/ACT inhaler Inhale 1-2 puffs into the lungs every 6 (six) hours as needed for wheezing or shortness of breath. 1 Inhaler 5  . albuterol (PROVENTIL) (2.5 MG/3ML) 0.083% nebulizer solution Take 3 mLs (2.5 mg total) by nebulization every 4 (four) hours as needed for wheezing. 75 mL 12  . aspirin EC 81 MG EC tablet Take 1 tablet (81 mg total) by mouth daily. 30 tablet 0  . citalopram (CELEXA) 20 MG tablet TAKE 1 TABLET BY MOUTH DAILY (Patient taking differently: Take 20 mg by mouth once a day) 90 tablet 3  . feeding  supplement, ENSURE ENLIVE, (ENSURE ENLIVE) LIQD Take 237 mLs by mouth 3 (three) times daily between meals. 237 mL 12  . fluticasone furoate-vilanterol (BREO ELLIPTA) 200-25 MCG/INH AEPB Inhale 1 puff into the lungs daily. 1 each 5  . ipratropium-albuterol (DUONEB) 0.5-2.5 (3) MG/3ML SOLN Take 3 mLs by nebulization 2 (two) times daily. 360 mL 0  . levothyroxine (SYNTHROID, LEVOTHROID) 75 MCG tablet TAKE 1 TABLET BY MOUTH DAILY BEFORE BREAKFAST. 90 tablet 0  . OXYGEN Inhale 4 L into the lungs continuous.     . traMADol (ULTRAM) 50 MG tablet Take 1 tablet (50 mg total) by mouth every 6 (six) hours as needed for moderate pain. 10 tablet 0   No current facility-administered medications on file prior to visit.    Review of Systems  Constitutional: Negative for other unusual diaphoresis or sweats HENT: Negative for ear discharge or swelling Eyes: Negative for other worsening visual disturbances Respiratory: Negative for stridor or other swelling  Gastrointestinal: Negative for worsening distension or other blood Genitourinary: Negative for retention or other urinary change Musculoskeletal: Negative for other MSK pain or swelling Skin: Negative for color change or other new lesions Neurological: Negative for worsening tremors and other numbness  Psychiatric/Behavioral: Negative for worsening agitation or other fatigue All other system neg per pt    Objective:   Physical Exam BP 101/68 (BP Location: Left Arm, Patient Position: Sitting, Cuff Size: Normal)   Pulse 85   Ht 5' (1.524 m)   Wt 108 lb (49 kg)   BMI 21.09 kg/m  VS noted,  Constitutional: Pt appears in NAD HENT: Head: NCAT.  Right Ear: External ear normal.  Left Ear: External ear normal.  Eyes: . Pupils are equal, round, and reactive to light. Conjunctivae and EOM are normal Nose: without d/c or deformity Neck: Neck supple. Gross normal ROM Cardiovascular: Normal rate and regular rhythm.   Pulmonary/Chest: Effort normal and  breath sounds without rales or wheezing.  Abd:  Soft, NT, ND, + BS, no organomegaly Neurological: Pt is alert. At baseline orientation, motor grossly intact Skin: Skin is warm. No rashes, other new lesions, no LE edema Psychiatric: Pt behavior is normal without agitation  No other exam findings  Lab Results  Component Value Date   WBC 5.0 03/30/2018   HGB 10.4 (L) 03/30/2018   HCT 33.6 (L) 03/30/2018   PLT 176 03/30/2018   GLUCOSE 92 04/02/2018   CHOL 223 (H) 06/03/2017   TRIG 50.0 06/03/2017   HDL 104.10 06/03/2017   LDLDIRECT 102.1 10/10/2010   LDLCALC 109 (H) 06/03/2017   ALT 11 (L) 03/29/2018   AST 19 03/29/2018   NA 137 04/02/2018   K 4.6 04/02/2018   CL 94 (L) 04/02/2018   CREATININE 0.58 04/02/2018   BUN 15 04/02/2018   CO2 40 (H) 04/02/2018   TSH 0.378 03/25/2018  INR 1.58 (H) 03/25/2016   HGBA1C 5.7 (H) 03/29/2018        Assessment & Plan:

## 2018-04-14 NOTE — Assessment & Plan Note (Signed)
Now stable on chronic home o2 4L, no plans to try to wean

## 2018-04-14 NOTE — Assessment & Plan Note (Signed)
stable overall by history and exam, recent data reviewed with pt, and pt to continue medical treatment as before,  to f/u any worsening symptoms or concerns  

## 2018-04-15 ENCOUNTER — Other Ambulatory Visit: Payer: Self-pay

## 2018-04-15 ENCOUNTER — Telehealth: Payer: Self-pay | Admitting: Internal Medicine

## 2018-04-15 DIAGNOSIS — I5030 Unspecified diastolic (congestive) heart failure: Secondary | ICD-10-CM | POA: Diagnosis not present

## 2018-04-15 DIAGNOSIS — J449 Chronic obstructive pulmonary disease, unspecified: Secondary | ICD-10-CM | POA: Diagnosis not present

## 2018-04-15 DIAGNOSIS — I2781 Cor pulmonale (chronic): Secondary | ICD-10-CM | POA: Diagnosis not present

## 2018-04-15 DIAGNOSIS — J9612 Chronic respiratory failure with hypercapnia: Secondary | ICD-10-CM | POA: Diagnosis not present

## 2018-04-15 DIAGNOSIS — M810 Age-related osteoporosis without current pathological fracture: Secondary | ICD-10-CM | POA: Diagnosis not present

## 2018-04-15 DIAGNOSIS — F329 Major depressive disorder, single episode, unspecified: Secondary | ICD-10-CM | POA: Diagnosis not present

## 2018-04-15 DIAGNOSIS — G9341 Metabolic encephalopathy: Secondary | ICD-10-CM | POA: Diagnosis not present

## 2018-04-15 DIAGNOSIS — I110 Hypertensive heart disease with heart failure: Secondary | ICD-10-CM | POA: Diagnosis not present

## 2018-04-15 DIAGNOSIS — F419 Anxiety disorder, unspecified: Secondary | ICD-10-CM | POA: Diagnosis not present

## 2018-04-15 NOTE — Telephone Encounter (Signed)
Yes, these medications were discontinued at the last hospital d/c

## 2018-04-15 NOTE — Telephone Encounter (Signed)
Pt and Jasmine [name] has been informed and expressed understanding.

## 2018-04-15 NOTE — Telephone Encounter (Signed)
Copied from [place] [phone]. Topic: Quick Communication - See Telephone Encounter >> Apr 15, 2018 11:35 AM Mylinda Latina, NT wrote: CRM for notification. See Telephone encounter for: 04/15/18. Mariann Laster calling from [facility] is with the patient for a home visit . She states the patient is taking the ALPRAZolam (XANAX) 0.25 MG tablet , potassium chloride (K-DUR) 10 MEQ tablet furosemide (LASIX) 40 MG tablet and all these medication are not on her  current med list . Her discharge instructions ( d/c date from hosp 04/02/18) states to discontinue these medication and that  the patient has been taking it the entire time. The nurse is wanting to  know if these medication are suppose to be discharged or if she needs to continue to take them Can you give Mariann Laster a call for a CB# [phone].  Please call patient also to clarify these medications with her CB# [phone]    She states that her weight today is 106.7lbs and her weight on 04/01/18 121lbs

## 2018-04-15 NOTE — Telephone Encounter (Signed)
Still no sign of form in RA's lookat or on the fax machine Called spoke with Corene Cornea w/ [facility] to request fax be resent Per Corene Cornea, he is unable to resend fax but will request this be done by the resp therapist who initiated the request Form to be refaxed to triage  Will await form

## 2018-04-15 NOTE — Patient Outreach (Signed)
[name]) Care Management   04/15/2018  Jasmine [name] 1946-10-20 [id number]  Jasmine [name] is an 72 y.o. female  Subjective: client reports she went to primary care office on yesterday. She denies any shortness of breath at rest. She reports she is short of breath when she does a lot.  Objective:  BP (!) 104/58   Resp 20   Wt 106 lb 11.2 oz (48.4 kg)   BMI 20.84 kg/m  Pulse oximetry not registering-dark nail polish. ROS  Physical Exam skin warm, dry, color within normal limits.respirations even/unlabored.  Encounter Medications:   Outpatient Encounter Medications as of 04/15/2018  Medication Sig Note  . albuterol (PROAIR HFA) 108 (90 Base) MCG/ACT inhaler Inhale 1-2 puffs into the lungs every 6 (six) hours as needed for wheezing or shortness of breath. 03/29/2018: LF 02-23-18 25DS  . aspirin EC 81 MG EC tablet Take 1 tablet (81 mg total) by mouth daily.   . citalopram (CELEXA) 20 MG tablet TAKE 1 TABLET BY MOUTH DAILY (Patient taking differently: Take 20 mg by mouth once a day) 03/29/2018: LF 01-28-18 90DS  . fluticasone furoate-vilanterol (BREO ELLIPTA) 200-25 MCG/INH AEPB Inhale 1 puff into the lungs daily. 03/29/2018: LF 01-28-18 30DS  . levothyroxine (SYNTHROID, LEVOTHROID) 75 MCG tablet TAKE 1 TABLET BY MOUTH DAILY BEFORE BREAKFAST. 03/29/2018: LF 03-19-18 90DS  . OXYGEN Inhale 4 L into the lungs continuous.    [name] albuterol (PROVENTIL) (2.5 MG/3ML) 0.083% nebulizer solution Take 3 mLs (2.5 mg total) by nebulization every 4 (four) hours as needed for wheezing. (Patient not taking: Reported on 04/15/2018)   . feeding supplement, ENSURE ENLIVE, (ENSURE ENLIVE) LIQD Take 237 mLs by mouth 3 (three) times daily between meals.   [name] ipratropium-albuterol (DUONEB) 0.5-2.5 (3) MG/3ML SOLN Take 3 mLs by nebulization 2 (two) times daily. (Patient not taking: Reported on 04/15/2018)   . traMADol (ULTRAM) 50 MG tablet Take 1 tablet (50 mg total) by mouth every 6 (six) hours as needed  for moderate pain.    No facility-administered encounter medications on file as of 04/15/2018.     Functional Status:   In your present state of health, do you have any difficulty performing the following activities: 03/28/2018 03/28/2018  Hearing? N N  Vision? N N  Difficulty concentrating or making decisions? N N  Walking or climbing stairs? Y Y  Dressing or bathing? Y Y  Doing errands, shopping? Y Y  Some recent data might be hidden    Fall/Depression Screening:    Fall Risk  04/15/2018 04/14/2018 02/19/2017  Falls in the past year? Yes Yes Yes  Number falls in past yr: 1 1 -  Injury with Fall? Yes Yes -  Risk Factor Category  High Fall Risk - -  Risk for fall due to : History of fall(s) - -  Follow up - - -   PHQ 2/9 Scores 02/19/2017 01/22/2017 12/23/2016 11/25/2016 10/28/2016 09/23/2016 08/26/2016  PHQ - 2 Score 0 0 0 0 0 0 0  PHQ- 9 Score - - - - - - -    Assessment: Referral received 03/29/18. Admitted with acute encephalopathy: per chart multifactorial to include non compliance, substance abuse(cocaine use), benzodiazepine abuse, OSA.   Admitted 6/8-6/14 for acute on chronic respiratory failure. Client reports she is doing better since admission to the hospital. She reports she and her first husband are back together again and states that he is "real good to her".   Client is on continuous oxygen per  nasal cannula. Client alert, oriented to self, place, situation. Client acknowledges that she can be forgetful at times.  During Home visit [facility] social worker arrived at client's home.  Fall risk-history of fall. RNCM discussed fall prevention strategies.  Medications reviewed. Discrepancy: client is taking furosemide twice/day; potassium 10 meq as well as alprazolam. However these medications are not listed on her medication list in chart. Per discharged instructions. Client is to stop the alprazolam and furosemide. Client states she was not aware that she was supposed to stop  these medications. RNCM called primary care to inform and request they follow up with client for further direction.  RNCM unable to complete home visit at this time in order for home health social worker to complete her visit.  Plan: continue to follow.  Thea Silversmith, RN, MSN, [place] Coordinator Cell: [phone]

## 2018-04-16 ENCOUNTER — Telehealth: Payer: Self-pay | Admitting: Pulmonary Disease

## 2018-04-16 ENCOUNTER — Ambulatory Visit: Payer: Medicare HMO | Admitting: Adult Health

## 2018-04-16 ENCOUNTER — Other Ambulatory Visit: Payer: Self-pay

## 2018-04-16 ENCOUNTER — Telehealth: Payer: Self-pay | Admitting: Internal Medicine

## 2018-04-16 DIAGNOSIS — J9612 Chronic respiratory failure with hypercapnia: Secondary | ICD-10-CM | POA: Diagnosis not present

## 2018-04-16 DIAGNOSIS — F419 Anxiety disorder, unspecified: Secondary | ICD-10-CM | POA: Diagnosis not present

## 2018-04-16 DIAGNOSIS — F329 Major depressive disorder, single episode, unspecified: Secondary | ICD-10-CM | POA: Diagnosis not present

## 2018-04-16 DIAGNOSIS — J449 Chronic obstructive pulmonary disease, unspecified: Secondary | ICD-10-CM | POA: Diagnosis not present

## 2018-04-16 DIAGNOSIS — M810 Age-related osteoporosis without current pathological fracture: Secondary | ICD-10-CM | POA: Diagnosis not present

## 2018-04-16 DIAGNOSIS — I5030 Unspecified diastolic (congestive) heart failure: Secondary | ICD-10-CM | POA: Diagnosis not present

## 2018-04-16 DIAGNOSIS — I2781 Cor pulmonale (chronic): Secondary | ICD-10-CM | POA: Diagnosis not present

## 2018-04-16 DIAGNOSIS — I110 Hypertensive heart disease with heart failure: Secondary | ICD-10-CM | POA: Diagnosis not present

## 2018-04-16 DIAGNOSIS — G9341 Metabolic encephalopathy: Secondary | ICD-10-CM | POA: Diagnosis not present

## 2018-04-16 NOTE — Telephone Encounter (Signed)
Jasmine [name], [facility], calling about this request. Per Jasmine [name], Pt is requesting a Trilogy Vent for her Cpap. Jasmine [name] has faxed a Rx request and would like to be sure we have received it. Cb is [phone].

## 2018-04-16 NOTE — Telephone Encounter (Signed)
Attempted to call Apolonio Schneiders with [facility] regarding fax RX for Trilogy Vent for pt's cpap machine. At this time, no RX has been rec'd. I did not receive an answer at time of call. I have left a voicemail message for pt to return call. X1

## 2018-04-16 NOTE — Telephone Encounter (Signed)
Very sorry, I am not responsible for any orders related to CPAP treatment or supplies,  This is normally done by the provider who manages this for the patient.,

## 2018-04-16 NOTE — Telephone Encounter (Signed)
Copied from [place] [id number]. Topic: Inquiry >> Apr 16, 2018  9:50 AM Margot Ables wrote: Reason for CRM: pt states that [facility] is wanting to replace her cpap with a bipap machine. She talked to Dennis/Denny with [facility] and was told paperwork was sent to [name] and waiting for a signature for her equipment to be replaced/delivered/setup & to pick up the old equipment. Pt requesting follow up.

## 2018-04-16 NOTE — Telephone Encounter (Signed)
Pt has been informed and expressed understanding.  

## 2018-04-16 NOTE — Patient Outreach (Signed)
[facility]) Care Management  04/16/2018  Jasmine [name] [birth year] [id number]   RNCM has made multiple attempts to complete assessment. RNCM called later yesterday and physical therapist was with client. RNCM called today and advanced home care nurse currently present with client.   Plan: follow up next week to attempt to complete assessment.  Thea Silversmith, RN, MSN, [place] Coordinator Cell: [phone]

## 2018-04-19 ENCOUNTER — Other Ambulatory Visit: Payer: Self-pay

## 2018-04-19 ENCOUNTER — Telehealth: Payer: Self-pay | Admitting: Internal Medicine

## 2018-04-19 DIAGNOSIS — I5030 Unspecified diastolic (congestive) heart failure: Secondary | ICD-10-CM | POA: Diagnosis not present

## 2018-04-19 DIAGNOSIS — F329 Major depressive disorder, single episode, unspecified: Secondary | ICD-10-CM | POA: Diagnosis not present

## 2018-04-19 DIAGNOSIS — G9341 Metabolic encephalopathy: Secondary | ICD-10-CM | POA: Diagnosis not present

## 2018-04-19 DIAGNOSIS — F419 Anxiety disorder, unspecified: Secondary | ICD-10-CM | POA: Diagnosis not present

## 2018-04-19 DIAGNOSIS — J449 Chronic obstructive pulmonary disease, unspecified: Secondary | ICD-10-CM | POA: Diagnosis not present

## 2018-04-19 DIAGNOSIS — M810 Age-related osteoporosis without current pathological fracture: Secondary | ICD-10-CM | POA: Diagnosis not present

## 2018-04-19 DIAGNOSIS — I110 Hypertensive heart disease with heart failure: Secondary | ICD-10-CM | POA: Diagnosis not present

## 2018-04-19 DIAGNOSIS — J9612 Chronic respiratory failure with hypercapnia: Secondary | ICD-10-CM | POA: Diagnosis not present

## 2018-04-19 DIAGNOSIS — I2781 Cor pulmonale (chronic): Secondary | ICD-10-CM | POA: Diagnosis not present

## 2018-04-19 NOTE — Patient Outreach (Signed)
[facility]) Care Management  04/19/2018  VICENTA OLDS 19-Jun-1946 [id number]   RNCM called to follow up and complete assessment. Client states this is not a good time, that she is taking care of some business right now. She denies any issues. RNCM discussed scheduling another home visit. Home visit scheduled with client, but client states she is not home and does not have her 33 and would like RNCM call at another time to confirm the scheduled visit.  Plan: RNCM will call within the next 1-2 weeks to confirm scheduled home visit date/time.  Thea Silversmith, RN, MSN, [place] Coordinator Cell: [phone]

## 2018-04-19 NOTE — Telephone Encounter (Signed)
Will keep message open until RX has been received.

## 2018-04-19 NOTE — Telephone Encounter (Signed)
Jasmine [name] is calling back about Trilogy [phone]

## 2018-04-19 NOTE — Telephone Encounter (Signed)
Copied from [place] [id number]. Topic: Inquiry >> Apr 16, 2018  9:50 AM Margot Ables wrote: Reason for CRM: pt states that [facility] is wanting to replace her cpap with a bipap machine. She talked to Dennis/Denny with [facility] and was told paperwork was sent to [name] and waiting for a signature for her equipment to be replaced/delivered/setup & to pick up the old equipment. Pt requesting follow up.

## 2018-04-19 NOTE — Telephone Encounter (Signed)
Spoke with patient. Advised her that we are still trying to get [facility] to fax the RX over to our office. She verbalized understanding.   Left detailed message for Sharlet Salina to call us back.

## 2018-04-19 NOTE — Telephone Encounter (Signed)
I have checked Dr. Bari Mantis look at. This form is not in this location. I have called [facility] to have this refaxed again.

## 2018-04-19 NOTE — Telephone Encounter (Signed)
Jasmine [name] returned phone call; he is traveling on the road now; but will contact [place] office to have someone fax the RX over to [place] again; otherwise, he can be reached at [phone]

## 2018-04-20 ENCOUNTER — Telehealth: Payer: Self-pay | Admitting: Internal Medicine

## 2018-04-20 DIAGNOSIS — M810 Age-related osteoporosis without current pathological fracture: Secondary | ICD-10-CM | POA: Diagnosis not present

## 2018-04-20 DIAGNOSIS — G9341 Metabolic encephalopathy: Secondary | ICD-10-CM | POA: Diagnosis not present

## 2018-04-20 DIAGNOSIS — J449 Chronic obstructive pulmonary disease, unspecified: Secondary | ICD-10-CM | POA: Diagnosis not present

## 2018-04-20 DIAGNOSIS — I110 Hypertensive heart disease with heart failure: Secondary | ICD-10-CM | POA: Diagnosis not present

## 2018-04-20 DIAGNOSIS — F329 Major depressive disorder, single episode, unspecified: Secondary | ICD-10-CM | POA: Diagnosis not present

## 2018-04-20 DIAGNOSIS — I2781 Cor pulmonale (chronic): Secondary | ICD-10-CM | POA: Diagnosis not present

## 2018-04-20 DIAGNOSIS — I5030 Unspecified diastolic (congestive) heart failure: Secondary | ICD-10-CM | POA: Diagnosis not present

## 2018-04-20 DIAGNOSIS — J9612 Chronic respiratory failure with hypercapnia: Secondary | ICD-10-CM | POA: Diagnosis not present

## 2018-04-20 DIAGNOSIS — F419 Anxiety disorder, unspecified: Secondary | ICD-10-CM | POA: Diagnosis not present

## 2018-04-20 NOTE — Telephone Encounter (Signed)
Will close this message since there is another message open on this patient for the same matter.

## 2018-04-20 NOTE — Telephone Encounter (Signed)
Received fax today. Will place form in RA's look-at folder.

## 2018-04-20 NOTE — Telephone Encounter (Signed)
Please view 04/16/18 telephone encounter. This has been discussed.

## 2018-04-20 NOTE — Telephone Encounter (Signed)
Called by answering service H/H called after hours due to patient c/o ankle swelling 1+ pitting edema, sob, and chest pain   Answering service rec pt go to ED pt refused. I am also rec pt go to ED and informed answering service to relay message to patient or risk consequences of not going to the ED to be evaluated   Dr. Olivia Mackie McLean-Scocuzza

## 2018-04-21 ENCOUNTER — Encounter (HOSPITAL_COMMUNITY): Payer: Self-pay | Admitting: *Deleted

## 2018-04-21 ENCOUNTER — Emergency Department (HOSPITAL_COMMUNITY)
Admission: EM | Admit: 2018-04-21 | Discharge: 2018-04-21 | Disposition: A | Discharge status: Home / Self Care | Payer: Medicare HMO | Attending: Emergency Medicine | Admitting: Emergency Medicine

## 2018-04-21 ENCOUNTER — Other Ambulatory Visit: Payer: Self-pay

## 2018-04-21 ENCOUNTER — Emergency Department (HOSPITAL_COMMUNITY): Payer: Medicare HMO

## 2018-04-21 DIAGNOSIS — R600 Localized edema: Secondary | ICD-10-CM

## 2018-04-21 DIAGNOSIS — R0600 Dyspnea, unspecified: Secondary | ICD-10-CM | POA: Diagnosis not present

## 2018-04-21 DIAGNOSIS — J449 Chronic obstructive pulmonary disease, unspecified: Secondary | ICD-10-CM | POA: Diagnosis not present

## 2018-04-21 DIAGNOSIS — Z85118 Personal history of other malignant neoplasm of bronchus and lung: Secondary | ICD-10-CM | POA: Diagnosis not present

## 2018-04-21 DIAGNOSIS — I5032 Chronic diastolic (congestive) heart failure: Secondary | ICD-10-CM | POA: Diagnosis not present

## 2018-04-21 DIAGNOSIS — Z9981 Dependence on supplemental oxygen: Secondary | ICD-10-CM | POA: Diagnosis not present

## 2018-04-21 DIAGNOSIS — R0609 Other forms of dyspnea: Secondary | ICD-10-CM | POA: Diagnosis not present

## 2018-04-21 DIAGNOSIS — E039 Hypothyroidism, unspecified: Secondary | ICD-10-CM | POA: Diagnosis not present

## 2018-04-21 DIAGNOSIS — Z79899 Other long term (current) drug therapy: Secondary | ICD-10-CM | POA: Insufficient documentation

## 2018-04-21 DIAGNOSIS — Z7982 Long term (current) use of aspirin: Secondary | ICD-10-CM | POA: Diagnosis not present

## 2018-04-21 DIAGNOSIS — I110 Hypertensive heart disease with heart failure: Secondary | ICD-10-CM | POA: Insufficient documentation

## 2018-04-21 DIAGNOSIS — I509 Heart failure, unspecified: Secondary | ICD-10-CM

## 2018-04-21 DIAGNOSIS — R0602 Shortness of breath: Secondary | ICD-10-CM | POA: Diagnosis not present

## 2018-04-21 DIAGNOSIS — Z87891 Personal history of nicotine dependence: Secondary | ICD-10-CM | POA: Diagnosis not present

## 2018-04-21 LAB — CBC
HCT: 39.1 % (ref 36.0–46.0)
Hemoglobin: 11.5 g/dL — ABNORMAL LOW (ref 12.0–15.0)
MCH: 30.7 pg (ref 26.0–34.0)
MCHC: 29.4 g/dL — AB (ref 30.0–36.0)
MCV: 104.5 fL — ABNORMAL HIGH (ref 78.0–100.0)
Platelets: 137 10*3/uL — ABNORMAL LOW (ref 150–400)
RBC: 3.74 MIL/uL — AB (ref 3.87–5.11)
RDW: 14.6 % (ref 11.5–15.5)
WBC: 4.1 10*3/uL (ref 4.0–10.5)

## 2018-04-21 LAB — BASIC METABOLIC PANEL
Anion gap: 6 (ref 5–15)
BUN: 8 mg/dL (ref 8–23)
CALCIUM: 9.3 mg/dL (ref 8.9–10.3)
CO2: 37 mmol/L — ABNORMAL HIGH (ref 22–32)
Chloride: 100 mmol/L (ref 98–111)
Creatinine, Ser: 0.55 mg/dL (ref 0.44–1.00)
GLUCOSE: 85 mg/dL (ref 70–99)
Potassium: 4.5 mmol/L (ref 3.5–5.1)
Sodium: 143 mmol/L (ref 135–145)

## 2018-04-21 LAB — I-STAT TROPONIN, ED: TROPONIN I, POC: 0 ng/mL (ref 0.00–0.08)

## 2018-04-21 LAB — BRAIN NATRIURETIC PEPTIDE: B Natriuretic Peptide: 13.4 pg/mL (ref 0.0–100.0)

## 2018-04-21 MED ORDER — FUROSEMIDE 40 MG PO TABS: 40.0000 mg | ORAL_TABLET | Freq: Every day | ORAL | 0 refills | Status: DC

## 2018-04-21 MED ORDER — FUROSEMIDE 10 MG/ML IJ SOLN
40.0000 mg | Freq: Once | INTRAMUSCULAR | Status: AC
  Administered 2018-04-21: 40 mg via INTRAVENOUS
  Filled 2018-04-21: qty 4

## 2018-04-21 NOTE — Telephone Encounter (Signed)
Copied from [place] [phone]. Topic: General - Other >> Apr 21, 2018  1:50 PM Williams-Neal, Sade R wrote: Pt called in to let you all know she is in the ED and is being giving lasix due to the swelling. And she would like to be back on it

## 2018-04-21 NOTE — ED Triage Notes (Signed)
Pt in on home O2 at 4L, O2 reading in triage 90-92% which patient states is normal for her

## 2018-04-21 NOTE — Discharge Instructions (Addendum)
You have been diagnosed with an exacerbation of your congestive heart failure. Please resume taking lasix 40 mg PO QD to prevent swelling in your legs and difficulty breathing. Return to the ED with worsening symptoms of shortness of breath, leg swelling, or chest pain. Please follow-up with your PCP.

## 2018-04-21 NOTE — ED Triage Notes (Signed)
Pt in c/o bilateral feet and ankle swelling in the last few days, pt states her MD took her off of her lasix and the swelling started after that, no distress noted

## 2018-04-21 NOTE — ED Provider Notes (Signed)
[facility] EMERGENCY DEPARTMENT Provider Note   CSN: [id number] Arrival date & time: 04/21/18  1117     History   Chief Complaint Chief Complaint  Patient presents with  . Leg Swelling    HPI Jasmine [name] is a 72 y.o. female.  HPI: Pt has a history of diastolic CHF, COPD (on home O2 4L, goal O2 sat of 89-93), lung cancer, DVT, HTN, cocaine abuse who presents to the ED with bilateral leg swelling (L>R) and worsened dyspnea on exertion. Jasmine [name] states that her lasix was discontinued at her most recent hospitalization last month for COPD exacerbation and encephalopathy. Since discharge she has noticed gradual swelling in her legs. She states that while she has dyspnea at baseline, her shortness of breath has worsened especially with exertion. She believes she has gained weight since discharge from the hospital (she was 106 lb at the time). She denies any recent chest pain, cough, hemoptysis, fevers, or chills. She has a history of DVT in her left leg that occurred about 30 years ago per patient.   Past Medical History:  Diagnosis Date  . ALLERGIC RHINITIS 05/01/2007  . Anxiety   . Arthritis    "hands" (12/28/2015)  . ASTHMA 05/01/2007   "since I was a child"  . Cancer (HCC)    LUNG  . CHF (congestive heart failure) ([place])   . Chronic bronchitis ([place])   . COPD (chronic obstructive pulmonary disease) ([place])   . DEPRESSION 07/21/2008  . DVT (deep venous thrombosis) (HCC) 1960s   LLE  . FATIGUE 10/10/2010  . HYPERTENSION 05/01/2007  . HYPERTHYROIDISM 11/23/2007   Pt endorses having had Graves disease, possibly radioactive iodine x1, but no thyroidectomy  . HYPOTHYROIDISM 08/23/2009  . LUMBAR RADICULOPATHY, RIGHT 08/25/2008  . On home oxygen therapy    "3-4L; qd; all the time" (12/28/2015)  . OSTEOPOROSIS 05/01/2007  . SHOULDER PAIN, LEFT 08/23/2009  . SINUSITIS, CHRONIC 10/10/2010    Patient Active Problem List   Diagnosis Date Noted  . COPD with acute  exacerbation ([place])   . Hypothyroidism   . Anxiety   . Acute encephalopathy 03/27/2018  . Respiratory failure with hypercapnia ([place]) 03/24/2018  . Acute on chronic respiratory failure with hypercapnia ([place]) 03/23/2018  . Hypercarbia 03/22/2018  . Dehydration 03/22/2018  . Cerumen impaction 02/18/2018  . Polysubstance abuse ([place]) 01/26/2018  . Acute on chronic respiratory failure ([place]) 01/07/2018  . Aspiration into airway 06/03/2017  . Rash 07/12/2016  . Bilateral hearing loss 06/05/2016  . Hyperglycemia 05/21/2016  . Abnormal LFTs 03/24/2016  . Elevated blood sugar 03/24/2016  . Solitary pulmonary nodule 01/09/2016  . Chronic respiratory failure with hypoxia ([place])   . On home oxygen therapy   . Chronic diastolic congestive heart failure ([place])   . Bacteremia Step viridans 12/27/2015  . Tobacco abuse disorder 12/25/2015  . Hypothyroidism following radioiodine therapy 09/26/2014  . Protein-calorie malnutrition, severe ([place]) 06/23/2014  . Cor pulmonale ([place]) 01/12/2013  . COPD (chronic obstructive pulmonary disease) ([place]) 11/29/2011  . Macrocytosis without anemia 11/09/2011  . SINUSITIS, CHRONIC 10/10/2010  . FATIGUE 10/10/2010  . Depression with anxiety 07/21/2008  . Essential hypertension 05/01/2007  . ALLERGIC RHINITIS 05/01/2007  . ASTHMA 05/01/2007  . OSTEOPOROSIS 05/01/2007    Past Surgical History:  Procedure Laterality Date  . APPENDECTOMY    . DILATION AND CURETTAGE OF UTERUS  multiple   history of multiple dialations and curettages and miscarriages, unfortunately never carrying a child to term  .  ECTOPIC PREGNANCY SURGERY  "late '60s or early '70s  . ELECTROCARDIOGRAM  06/20/2006  . OOPHORECTOMY Right      OB History    Gravida  5   Para      Term      Preterm      AB  5   Living        SAB  4   TAB      Ectopic  1   Multiple      Live Births               Home Medications    Prior to Admission medications   Medication Sig Start Date  End Date Taking? Authorizing Provider  albuterol (PROAIR HFA) 108 (90 Base) MCG/ACT inhaler Inhale 1-2 puffs into the lungs every 6 (six) hours as needed for wheezing or shortness of breath. 12/17/17   Rigoberto Noel, MD  albuterol (PROVENTIL) (2.5 MG/3ML) 0.083% nebulizer solution Take 3 mLs (2.5 mg total) by nebulization every 4 (four) hours as needed for wheezing. Patient not taking: Reported on 04/15/2018 06/03/17   Biagio Borg, MD  aspirin EC 81 MG EC tablet Take 1 tablet (81 mg total) by mouth daily. 03/27/16   Velvet Bathe, MD  citalopram (CELEXA) 20 MG tablet TAKE 1 TABLET BY MOUTH DAILY Patient taking differently: Take 20 mg by mouth once a day 10/21/17   Biagio Borg, MD  feeding supplement, ENSURE ENLIVE, (ENSURE ENLIVE) LIQD Take 237 mLs by mouth 3 (three) times daily between meals. Patient not taking: Reported on 04/15/2018 12/31/15   Florencia Reasons, MD  fluticasone furoate-vilanterol (BREO ELLIPTA) 200-25 MCG/INH AEPB Inhale 1 puff into the lungs daily. 12/17/17   Rigoberto Noel, MD  ipratropium-albuterol (DUONEB) 0.5-2.5 (3) MG/3ML SOLN Take 3 mLs by nebulization 2 (two) times daily. Patient not taking: Reported on 04/15/2018 04/02/18   Allie Bossier, MD  levothyroxine (SYNTHROID, LEVOTHROID) 75 MCG tablet TAKE 1 TABLET BY MOUTH DAILY BEFORE BREAKFAST. 03/19/18   Biagio Borg, MD  OXYGEN Inhale 4 L into the lungs continuous.     [provider]  traMADol (ULTRAM) 50 MG tablet Take 1 tablet (50 mg total) by mouth every 6 (six) hours as needed for moderate pain. Patient not taking: Reported on 04/15/2018 04/02/18   Allie Bossier, MD    Family History Family History  Problem Relation Age of Onset  . Lung cancer Father   . Alcohol abuse Brother   . Diabetes Brother   . Hypertension Other   . Stroke Mother   . Thyroid disease Mother   . Asthma Unknown        maternal aunts    Social History Social History   Tobacco Use  . Smoking status: Former Smoker    Packs/day: 1.00     Years: 54.00    Pack years: 54.00    Types: Cigarettes    Last attempt to quit: 07/21/2015    Years since quitting: 2.7  . Smokeless tobacco: Never Used  Substance Use Topics  . Alcohol use: Yes    Comment: 1-4 beers per month   . Drug use: Yes    Frequency: 2.0 times per week    Types: "Crack" cocaine    Comment: 12/28/2015 "sometimes weekends and holidays"     Allergies   Alendronate sodium   Review of Systems Review of Systems  Constitutional: Negative for chills and fever.  HENT: Positive for hearing loss.  Pt reports cerumen impaction with decreased hearing.   Eyes: Negative for visual disturbance.  Respiratory: Positive for shortness of breath. Negative for cough.   Cardiovascular: Positive for leg swelling. Negative for chest pain.  Gastrointestinal: Negative for abdominal distention, abdominal pain, diarrhea and nausea.  Skin: Negative for rash and wound.     Physical Exam Updated Vital Signs BP (!) 108/53 (BP Location: Left Arm)   Pulse 70   Temp 98.2 F (36.8 C) (Oral)   Resp (!) 21   SpO2 100%   Physical Exam  Constitutional: She is oriented to person, place, and time. She appears well-developed and well-nourished. No distress.  HENT:  Head: Normocephalic and atraumatic.  Cardiovascular: Normal rate and regular rhythm. Exam reveals no gallop and no friction rub.  No murmur heard. Pulmonary/Chest: Effort normal. No respiratory distress.  Decreased breath sounds throughout. No crackles appreciated in the bilateral lower lung fields.   Abdominal: Soft. She exhibits no distension. There is no tenderness.  Musculoskeletal:  Trace pitting edema on R lower extremity. 1+ pitting edema on L lower extremity. No pitting edema of thighs. No wounds or deformities of the lower extremities. 2+ pedal pulses.   Neurological: She is alert and oriented to person, place, and time.  Skin: Skin is warm and dry. No rash noted.  Psychiatric: She has a normal mood and  affect. Her behavior is normal.     ED Treatments / Results  Labs (all labs ordered are listed, but only abnormal results are displayed) Labs Reviewed  BASIC METABOLIC PANEL  CBC  BRAIN NATRIURETIC PEPTIDE  I-STAT TROPONIN, ED    EKG EKG Interpretation  Date/Time:  Wednesday April 21 2018 11:35:13 EDT Ventricular Rate:  78 PR Interval:    QRS Duration: 73 QT Interval:  370 QTC Calculation: 422 R Axis:   89 Text Interpretation:  Sinus rhythm Biatrial enlargement Anteroseptal infarct, age indeterminate Minimal ST elevation, inferior leads No significant change since last tracing Confirmed by Theotis Burrow [phone]) on 04/21/2018 12:05:39 PM   Radiology No results found.  Procedures Procedures (including critical care time)  Medications Ordered in ED Medications - No data to display   Initial Impression / Assessment and Plan / ED Course  I have reviewed the triage vital signs and the nursing notes.  Pertinent labs & imaging results that were available during my care of the patient were reviewed by me and considered in my medical decision making (see chart for details).    [name] is a 72 yo female with a history of diastolic CHF, COPD (on home O2 2L at rest, 4 L with exertion, goal O2 sat of 89-93), lung cancer, DVT, HTN, cocaine abuse who presents to the ED with bilateral leg swelling (L>R) and worsened dyspnea on exertion in the setting of discontinued lasix use for about a month. Differential includes CHF exacerbation, PE (given history and recent hospitalization), and COPD exacerbation (unlikely without worsened cough or wheezing). No concern for lower extremity cellulitis based on exam. On arrival, patient was afebrile with BP 117/105, HR 89, RR 20, and 94% O2 sat. Physical exam significant for decreased lung sounds throughout, trace pitting edema in the right lower extremity, and 2+ pitting edema in the left lower extremity. 8 lb weight gain since discharge from hospital  last month. Labs significant for negative troponin, BNP 13.4, and stable Cr. EKG unremarkable with no changes from prior. CXR unremarkable without pulmonary edema. Pt treated with 40 mg IV lasix with good response. On  reassessment, patient feels swelling and dyspnea have improved. Pt able to ambulate on her normal home oxygen requirement without desaturation.   Patient is deemed safe for discharge home. Discussed returning to the ED with worsening symptoms of dyspnea, lower extremity swelling, or chest pain. Patient given prescription for 40 mg lasix PO QD, which was her previous dose before discontinuation, and encouraged to follow up with PCP.  Final Clinical Impressions(s) / ED Diagnoses   Final diagnoses:  None    ED Discharge Orders    None       Arushi Partridge, Andree Elk, MD 04/21/18 1932    Rex Kras Wenda Over[place], MD 04/25/18 2013

## 2018-04-23 ENCOUNTER — Ambulatory Visit: Payer: Medicare HMO | Admitting: Internal Medicine

## 2018-04-23 ENCOUNTER — Other Ambulatory Visit: Payer: Self-pay | Admitting: *Deleted

## 2018-04-23 NOTE — Patient Outreach (Signed)
[facility]) Care Management  04/23/2018  LEISEL PINETTE 1946/02/22 [id number]   Covering for Erskin Burnet, notified that member reported to ED for heart failure complications (lower extremity swelling) after being taken off Lasix.  Restarted on Lasix and discharged.  Call placed to member to follow up on current health condition.  She report she does not have time to talk due to being in the middle of an appointment, but state her swelling has decreased as well as her weight (drop from 114 pounds to 106 pounds).  Denies any urgent concerns at this time, will update assigned care manager to follow up.  Valente David, [place], MSN [place] [phone]

## 2018-04-24 DIAGNOSIS — J449 Chronic obstructive pulmonary disease, unspecified: Secondary | ICD-10-CM | POA: Diagnosis not present

## 2018-04-26 DIAGNOSIS — G9341 Metabolic encephalopathy: Secondary | ICD-10-CM | POA: Diagnosis not present

## 2018-04-26 DIAGNOSIS — J449 Chronic obstructive pulmonary disease, unspecified: Secondary | ICD-10-CM | POA: Diagnosis not present

## 2018-04-26 DIAGNOSIS — F329 Major depressive disorder, single episode, unspecified: Secondary | ICD-10-CM | POA: Diagnosis not present

## 2018-04-26 DIAGNOSIS — I5030 Unspecified diastolic (congestive) heart failure: Secondary | ICD-10-CM | POA: Diagnosis not present

## 2018-04-26 DIAGNOSIS — J9612 Chronic respiratory failure with hypercapnia: Secondary | ICD-10-CM | POA: Diagnosis not present

## 2018-04-26 DIAGNOSIS — I2781 Cor pulmonale (chronic): Secondary | ICD-10-CM | POA: Diagnosis not present

## 2018-04-26 DIAGNOSIS — F419 Anxiety disorder, unspecified: Secondary | ICD-10-CM | POA: Diagnosis not present

## 2018-04-26 DIAGNOSIS — I110 Hypertensive heart disease with heart failure: Secondary | ICD-10-CM | POA: Diagnosis not present

## 2018-04-26 DIAGNOSIS — M810 Age-related osteoporosis without current pathological fracture: Secondary | ICD-10-CM | POA: Diagnosis not present

## 2018-04-28 ENCOUNTER — Telehealth: Payer: Self-pay | Admitting: Pulmonary Disease

## 2018-04-28 ENCOUNTER — Other Ambulatory Visit: Payer: Self-pay

## 2018-04-28 NOTE — Telephone Encounter (Signed)
Patient states she received a prescription for Tramadol from Dr. Sherral Hammers while in hospital but when she was discharged she believes she left it; pt states she contacted Dr. Sherral Hammers office but was told Dr. Sherral Hammers was not going to write another one and for her to contact RA. Pt requesting a prescription for Tramadol to be sent to: [facility]

## 2018-04-28 NOTE — Patient Outreach (Signed)
[facility]) Care Management   04/28/2018  Jasmine [name] 10-10-1946 [id number]  Jasmine [name] is an 72 y.o. female  Subjective: client denies shortness of breath she reports feeling well.  Objective:  BP 128/70   Pulse 75   Resp 20   Wt 104 lb 3.2 oz (47.3 kg) Comment: per patient report  SpO2 95%   BMI 20.35 kg/m   Review of Systems  Respiratory:       Diminished breath sounds. No DOE or SOB noted.  Cardiovascular:       S1S2 noted, regular, trace if any edema noted to left ankle/foot. No pitting noted.     Physical Exam Client ambulated to door, gate steady, no shortness of breath noted. skin warm, dry, color within normal limits.  Encounter Medications:   Outpatient Encounter Medications as of 04/28/2018  Medication Sig Note  . albuterol (PROAIR HFA) 108 (90 Base) MCG/ACT inhaler Inhale 1-2 puffs into the lungs every 6 (six) hours as needed for wheezing or shortness of breath. 03/29/2018: LF 02-23-18 25DS  . albuterol (PROVENTIL) (2.5 MG/3ML) 0.083% nebulizer solution Take 3 mLs (2.5 mg total) by nebulization every 4 (four) hours as needed for wheezing.   [name] aspirin EC 81 MG EC tablet Take 1 tablet (81 mg total) by mouth daily.   . citalopram (CELEXA) 20 MG tablet TAKE 1 TABLET BY MOUTH DAILY (Patient taking differently: Take 20 mg by mouth once a day)   . feeding supplement, ENSURE ENLIVE, (ENSURE ENLIVE) LIQD Take 237 mLs by mouth 3 (three) times daily between meals.   . fluticasone furoate-vilanterol (BREO ELLIPTA) 200-25 MCG/INH AEPB Inhale 1 puff into the lungs daily.   . furosemide (LASIX) 40 MG tablet Take 1 tablet (40 mg total) by mouth daily for 14 days.   . hydroxypropyl methylcellulose / hypromellose (ISOPTO TEARS / GONIOVISC) 2.5 % ophthalmic solution Place 1 drop into both eyes as needed for dry eyes.   [name] ipratropium-albuterol (DUONEB) 0.5-2.5 (3) MG/3ML SOLN Take 3 mLs by nebulization 2 (two) times daily.   [name] levothyroxine (SYNTHROID,  LEVOTHROID) 75 MCG tablet TAKE 1 TABLET BY MOUTH DAILY BEFORE BREAKFAST.   [name] OXYGEN Inhale 4 L into the lungs continuous.     No facility-administered encounter medications on file as of 04/28/2018.     Functional Status:   In your present state of health, do you have any difficulty performing the following activities: 04/15/2018 03/28/2018  Hearing? N N  Vision? N N  Difficulty concentrating or making decisions? Y N  Comment reports forgetful at times, but states "I think it is normal for my age". -  Walking or climbing stairs? Y Y  Dressing or bathing? Y Y  Doing errands, shopping? [name]  Preparing Food and eating ? Y -  Using the Toilet? N -  Some recent data might be hidden    Fall/Depression Screening:    Fall Risk  04/15/2018 04/14/2018 02/19/2017  Falls in the past year? Yes Yes Yes  Number falls in past yr: 1 1 -  Injury with Fall? Yes Yes -  Risk Factor Category  High Fall Risk - -  Risk for fall due to : History of fall(s) - -  Follow up - - -   PHQ 2/9 Scores 02/19/2017 01/22/2017 12/23/2016 11/25/2016 10/28/2016 09/23/2016 08/26/2016  PHQ - 2 Score 0 0 0 0 0 0 0  PHQ- 9 Score - - - - - - -    Assessment: 72  year old with history of, but not all inclusive: COPD, Heart failure, depression, HTN, asthma, COPD, cor [place], protein calorie malnutrition, hypothyroidism, hyperglycemia, polysubstance abuse, acute encephalopathy, anxiety.  4 admissions in 6 months and 1 Emergency room visit in 6 months.  RNCM called to follow up. Client with no complaints at this time. She denies any shortness of breath. She is up ambulating and no DOE noted. O2 dependent.  Emergency room visit on 7/3  For shortness of breath and swelling of lower extremities. Client was instructed to restart furosemide 40 mg daily.   Medication adherence problem: Client has been taking furosemide 40 mg twice per instructions on the bottle. Client report she did not realize that she was only supposed to take it once a day.  She was not able to find her after visit summary.  Client had an appointment scheduled on 04/23/18 with primary care, but was a "no show". She reports she did not realize that she had an appointment that day, and states she thought that may have been a wellness visit. RNCM assisted client in rescheduling her follow up appointment with primary care for Friday July 12.   RNCM also assisted client in scheduling pulmonology appointment for next week, July 16th.  Client acknowledges that she has some depression and is agreeable to a social work consult for some depression counseling and resources.   Plan: follow up with client in two weeks telephonically.  THN CM Care Plan Problem One     Most Recent Value  Care Plan Problem One  at risk for readmission as evidence by recent admisstion.  Role Documenting the Problem One  Care Management [place] for Problem One  Active  [facility] Long Term Goal   client will not be readmitted within the next 31 days.  THN Long Term Goal Start Date  04/05/18  Interventions for Problem One Long Term Goal  reviewed discharge instructions provided by her last Emergency room visit, RNCM called and schedueld follow up appointment with primary care and follow up appointment also scheduled with pulmonologist.  [facility] CM Short Term Goal #1   client will verbalize contact with home health agency within the next week.  THN CM Short Term Goal #1 Start Date  04/05/18  THN CM Short Term Goal #1 Met Date  04/15/18  THN CM Short Term Goal #2   client will attend follow up appointments within the next 30 days.  THN CM Short Term Goal #2 Start Date  04/05/18  Interventions for Short Term Goal #2  RNCM assist client with scheduling follow up visits.     Thea Silversmith, RN, MSN, [place] Coordinator Cell: [phone]

## 2018-04-28 NOTE — Telephone Encounter (Signed)
Corene Cornea, [facility], has called to see if Patient needs to be changed to Trilogy vent.  04/20/18 telephone note stated that form was in RA look at folder.  Will route to Cherina to follow up

## 2018-04-28 NOTE — Telephone Encounter (Signed)
RA is still deciding on this.

## 2018-04-28 NOTE — Telephone Encounter (Signed)
Spoke with pt, I advised her that Dr. Elsworth Soho is still deciding on this. Another note after this message has been updated with documentation of this. I advised her that the Rx for Tramadol was not written. Pt understood. I made her an appointment to see TP for hospital follow up. I will close this encounter since another message was routed to Dr. Elsworth Soho from [place] today 04/28/2018.

## 2018-04-29 DIAGNOSIS — I2781 Cor pulmonale (chronic): Secondary | ICD-10-CM | POA: Diagnosis not present

## 2018-04-29 DIAGNOSIS — F329 Major depressive disorder, single episode, unspecified: Secondary | ICD-10-CM | POA: Diagnosis not present

## 2018-04-29 DIAGNOSIS — I110 Hypertensive heart disease with heart failure: Secondary | ICD-10-CM | POA: Diagnosis not present

## 2018-04-29 DIAGNOSIS — J449 Chronic obstructive pulmonary disease, unspecified: Secondary | ICD-10-CM | POA: Diagnosis not present

## 2018-04-29 DIAGNOSIS — F419 Anxiety disorder, unspecified: Secondary | ICD-10-CM | POA: Diagnosis not present

## 2018-04-29 DIAGNOSIS — J9612 Chronic respiratory failure with hypercapnia: Secondary | ICD-10-CM | POA: Diagnosis not present

## 2018-04-29 DIAGNOSIS — I5030 Unspecified diastolic (congestive) heart failure: Secondary | ICD-10-CM | POA: Diagnosis not present

## 2018-04-29 DIAGNOSIS — G9341 Metabolic encephalopathy: Secondary | ICD-10-CM | POA: Diagnosis not present

## 2018-04-29 DIAGNOSIS — M810 Age-related osteoporosis without current pathological fracture: Secondary | ICD-10-CM | POA: Diagnosis not present

## 2018-04-29 NOTE — Telephone Encounter (Signed)
Called and spoke with Corene Cornea, advised of RA response. Verbalized understanding. Nothing further needed.

## 2018-04-29 NOTE — Telephone Encounter (Signed)
  Do not change, I will discuss with Tammy. I do not recommend trilogy for this patient

## 2018-04-30 ENCOUNTER — Telehealth: Payer: Self-pay | Admitting: Internal Medicine

## 2018-04-30 ENCOUNTER — Encounter: Payer: Self-pay | Admitting: Internal Medicine

## 2018-04-30 ENCOUNTER — Encounter: Payer: Self-pay | Admitting: *Deleted

## 2018-04-30 ENCOUNTER — Other Ambulatory Visit: Payer: Self-pay | Admitting: *Deleted

## 2018-04-30 ENCOUNTER — Ambulatory Visit (INDEPENDENT_AMBULATORY_CARE_PROVIDER_SITE_OTHER): Payer: Medicare HMO | Admitting: Internal Medicine

## 2018-04-30 VITALS — BP 128/70 | HR 91 | Ht 60.0 in | Wt 106.0 lb

## 2018-04-30 DIAGNOSIS — H9193 Unspecified hearing loss, bilateral: Secondary | ICD-10-CM

## 2018-04-30 DIAGNOSIS — I5032 Chronic diastolic (congestive) heart failure: Secondary | ICD-10-CM

## 2018-04-30 DIAGNOSIS — M545 Low back pain, unspecified: Secondary | ICD-10-CM | POA: Insufficient documentation

## 2018-04-30 DIAGNOSIS — J431 Panlobular emphysema: Secondary | ICD-10-CM | POA: Diagnosis not present

## 2018-04-30 DIAGNOSIS — G8929 Other chronic pain: Secondary | ICD-10-CM | POA: Insufficient documentation

## 2018-04-30 DIAGNOSIS — J9611 Chronic respiratory failure with hypoxia: Secondary | ICD-10-CM | POA: Diagnosis not present

## 2018-04-30 MED ORDER — TRAMADOL HCL 50 MG PO TABS: 50.0000 mg | ORAL_TABLET | Freq: Two times a day (BID) | ORAL | 2 refills | Status: DC | PRN

## 2018-04-30 MED ORDER — HYPROMELLOSE (GONIOSCOPIC) 2.5 % OP SOLN: 1.0000 [drp] | OPHTHALMIC | 2 refills | Status: DC | PRN

## 2018-04-30 MED ORDER — IPRATROPIUM-ALBUTEROL 0.5-2.5 (3) MG/3ML IN SOLN: 3.0000 mL | Freq: Two times a day (BID) | RESPIRATORY_TRACT | 3 refills | Status: DC

## 2018-04-30 MED ORDER — FUROSEMIDE 40 MG PO TABS: 40.0000 mg | ORAL_TABLET | Freq: Every day | ORAL | 3 refills | Status: DC | PRN

## 2018-04-30 NOTE — Assessment & Plan Note (Signed)
Improved with irrigation 

## 2018-04-30 NOTE — Telephone Encounter (Signed)
Copied from [place] [id number]. Topic: General - Other >> Apr 30, 2018  2:26 PM Yvette Rack wrote: Reason for CRM: [place], [place]  calling stating that the hydroxypropyl methylcellulose / hypromellose (ISOPTO TEARS / GONIOVISC) 2.5 % ophthalmic solution   OTC cost $45 and would like for pt to have something else that's cheaper

## 2018-04-30 NOTE — Assessment & Plan Note (Signed)
stable overall by history and exam, recent data reviewed with pt, and pt to continue medical treatment as before,  to f/u any worsening symptoms or concerns  

## 2018-04-30 NOTE — Telephone Encounter (Signed)
I would say ok for pt to f/u with ophthalmology as this is not usually in my scope of practice

## 2018-04-30 NOTE — Progress Notes (Signed)
Subjective:    Patient ID: Jasmine [name], female    DOB: 03/02/1946, 72 y.o.   MRN: [id number]  HPI Here seen at ED July 3 w/ PMH including dCHF, COPD on 4L O2, DVT, lung CA, HTN, cocaine abuse who p/w b/l leg swelling and dyspnea on exertion. Lasix was discontinued at last hospitalization at time of discharge and had not yet been restarted. Has had gradual worsening of LE edema and weight gain of ~7-8 lb. No chest pain or fevers. On exam at that time, no respiratory distress or crackles, well appearing, very mild LE edema L>R which is chronic for her. EKG reassuring, CXR negative for fluid, labs reassuring. It was  suspected mild volume overload since being off medication. She had no increased O2 requirement, no lab derangements, and no severe symptoms, was given IV lasix here, and asked to restart oral and f/u with providers.  No tobacco for about 2 yrs  No cocaine since mar 2019 and does not plan to restart.  Since ED visit, swelling has essentially resolved, and stomach fullness improved as well.  Pt denies chest pain, wheezing, orthopnea, PND, increased LE swelling, palpitations, dizziness or syncope. No falls.  Has had some increased sob/doe but admits to not taking the breo daily.  Pt continues to have recurring LBP without change in severity, bowel or bladder change, fever, wt loss,  worsening LE pain/numbness/weakness, gait change or falls. - asks for tramadol prn. Wt Readings from Last 3 Encounters:  04/30/18 106 lb (48.1 kg)  04/28/18 104 lb 3.2 oz (47.3 kg)  04/21/18 114 lb 3.2 oz (51.8 kg)   Past Medical History:  Diagnosis Date  . ALLERGIC RHINITIS 05/01/2007  . Anxiety   . Arthritis    "hands" (12/28/2015)  . ASTHMA 05/01/2007   "since I was a child"  . Cancer (HCC)    LUNG  . CHF (congestive heart failure) ([place])   . Chronic bronchitis ([place])   . COPD (chronic obstructive pulmonary disease) ([place])   . DEPRESSION 07/21/2008  . DVT (deep venous thrombosis) (HCC) 1960s   LLE  .  FATIGUE 10/10/2010  . HYPERTENSION 05/01/2007  . HYPERTHYROIDISM 11/23/2007   Pt endorses having had Graves disease, possibly radioactive iodine x1, but no thyroidectomy  . HYPOTHYROIDISM 08/23/2009  . LUMBAR RADICULOPATHY, RIGHT 08/25/2008  . On home oxygen therapy    "3-4L; qd; all the time" (12/28/2015)  . OSTEOPOROSIS 05/01/2007  . SHOULDER PAIN, LEFT 08/23/2009  . SINUSITIS, CHRONIC 10/10/2010   Past Surgical History:  Procedure Laterality Date  . APPENDECTOMY    . DILATION AND CURETTAGE OF UTERUS  multiple   history of multiple dialations and curettages and miscarriages, unfortunately never carrying a child to term  . ECTOPIC PREGNANCY SURGERY  "late '60s or early '70s  . ELECTROCARDIOGRAM  06/20/2006  . OOPHORECTOMY Right     reports that she quit smoking about 2 years ago. Her smoking use included cigarettes. She has a 54.00 pack-year smoking history. She has never used smokeless tobacco. She reports that she drinks alcohol. She reports that she has current or past drug history. Drug: "Crack" cocaine. Frequency: 2.00 times per week. family history includes Alcohol abuse in her brother; Asthma in her unknown relative; Diabetes in her brother; Hypertension in her other; Lung cancer in her father; Stroke in her mother; Thyroid disease in her mother. Allergies  Allergen Reactions  . Alendronate Sodium Other (See Comments)    Patient does not remember this reaction  Current Outpatient Medications on File Prior to Visit  Medication Sig Dispense Refill  . albuterol (PROAIR HFA) 108 (90 Base) MCG/ACT inhaler Inhale 1-2 puffs into the lungs every 6 (six) hours as needed for wheezing or shortness of breath. 1 Inhaler 5  . albuterol (PROVENTIL) (2.5 MG/3ML) 0.083% nebulizer solution Take 3 mLs (2.5 mg total) by nebulization every 4 (four) hours as needed for wheezing. 75 mL 12  . aspirin EC 81 MG EC tablet Take 1 tablet (81 mg total) by mouth daily. 30 tablet 0  . citalopram (CELEXA) 20 MG  tablet TAKE 1 TABLET BY MOUTH DAILY (Patient taking differently: Take 20 mg by mouth once a day) 90 tablet 3  . feeding supplement, ENSURE ENLIVE, (ENSURE ENLIVE) LIQD Take 237 mLs by mouth 3 (three) times daily between meals. 237 mL 12  . fluticasone furoate-vilanterol (BREO ELLIPTA) 200-25 MCG/INH AEPB Inhale 1 puff into the lungs daily. 1 each 5  . furosemide (LASIX) 40 MG tablet Take 1 tablet (40 mg total) by mouth daily for 14 days. 14 tablet 0  . hydroxypropyl methylcellulose / hypromellose (ISOPTO TEARS / GONIOVISC) 2.5 % ophthalmic solution Place 1 drop into both eyes as needed for dry eyes.    [name] ipratropium-albuterol (DUONEB) 0.5-2.5 (3) MG/3ML SOLN Take 3 mLs by nebulization 2 (two) times daily. 360 mL 0  . levothyroxine (SYNTHROID, LEVOTHROID) 75 MCG tablet TAKE 1 TABLET BY MOUTH DAILY BEFORE BREAKFAST. 90 tablet 0  . OXYGEN Inhale 4 L into the lungs continuous.      No current facility-administered medications on file prior to visit.    Review of Systems  Constitutional: Negative for other unusual diaphoresis or sweats HENT: Negative for ear discharge or swelling Eyes: Negative for other worsening visual disturbances Respiratory: Negative for stridor or other swelling  Gastrointestinal: Negative for worsening distension or other blood Genitourinary: Negative for retention or other urinary change Musculoskeletal: Negative for other MSK pain or swelling Skin: Negative for color change or other new lesions Neurological: Negative for worsening tremors and other numbness  Psychiatric/Behavioral: Negative for worsening agitation or other fatigue All other system neg per pt    Objective:   Physical Exam BP 128/70 (BP Location: Left Arm, Patient Position: Sitting, Cuff Size: Normal)   Pulse 91   Ht 5' (1.524 m)   Wt 106 lb (48.1 kg)   SpO2 95%   BMI 20.70 kg/m  VS noted,  Constitutional: Pt appears in NAD HENT: Head: NCAT.  Right Ear: External ear normal.  Left Ear: External  ear normal. hearing improved after bilat wax impaction resolved with irrigation Eyes: . Pupils are equal, round, and reactive to light. Conjunctivae and EOM are normal Nose: without d/c or deformity Neck: Neck supple. Gross normal ROM Cardiovascular: Normal rate and regular rhythm.   Pulmonary/Chest: Effort normal and breath sounds decreased without rales or wheezing.  Abd:  Soft, NT, ND, + BS, no organomegaly Neurological: Pt is alert. At baseline orientation, motor grossly intact Skin: Skin is warm. No rashes, other new lesions, no LE edema Psychiatric: Pt behavior is normal without agitation  No other exam findings Lab Results  Component Value Date   WBC 4.1 04/21/2018   HGB 11.5 (L) 04/21/2018   HCT 39.1 04/21/2018   PLT 137 (L) 04/21/2018   GLUCOSE 85 04/21/2018   CHOL 223 (H) 06/03/2017   TRIG 50.0 06/03/2017   HDL 104.10 06/03/2017   LDLDIRECT 102.1 10/10/2010   LDLCALC 109 (H) 06/03/2017   ALT 11 (  L) 03/29/2018   AST 19 03/29/2018   NA 143 04/21/2018   K 4.5 04/21/2018   CL 100 04/21/2018   CREATININE 0.55 04/21/2018   BUN 8 04/21/2018   CO2 37 (H) 04/21/2018   TSH 0.378 03/25/2018   INR 1.58 (H) 03/25/2016   HGBA1C 5.7 (H) 03/29/2018      Assessment & Plan:

## 2018-04-30 NOTE — Assessment & Plan Note (Signed)
stable overall by history and exam, recent data reviewed with pt, and pt to continue medical treatment as before,  to f/u any worsening symptoms or concerns, for tramadol prn 

## 2018-04-30 NOTE — Patient Outreach (Signed)
[facility]) Care Management  04/30/2018  Jasmine [name] 08/28/1946 [id number]   CSW was able to make initial contact with patient today to perform phone assessment, as well as assess and assist with social work needs and services.  CSW introduced self, explained role and types of services provided through [place] [facility] ([place] Management).  CSW further explained to patient that CSW works with patient's RNCM, also with [place] Management, Thea Silversmith. CSW then explained the reason for the call, indicating that Ms. Juleen China thought that patient would benefit from social work services and resources to assist with counseling and supportive services to help cope with symptoms of depression.  CSW obtained two HIPAA compliant identifiers from patient, which included patient's name and date of birth.  Patient admits that she would like to speak with CSW about her depression, as well as receive a list of providers that accept [facility].  CSW agreed to schedule an initial home visit with patient to provide counseling and supportive services, as well as to teach patient deep breathing exercises and relaxation techniques.  CSW will also mail patient EMMI information regarding "Signs and Symptoms of Depression", as well as a list of [name] therapists/counselors.  The initial home visit with patient has been scheduled for Wednesday, May 05, 2018 at 11:00AM.  Patient has CSW's contact information and has been encouraged to contact CSW if anything changes or if additional social work needs arise in the meantime.  Patient has been encouraged to take her psychotropic medications exactly as prescribed.  THN CM Care Plan Problem One     Most Recent Value  Care Plan Problem One  Patient is experiencing signs and symptoms of depression.  Role Documenting the Problem One  Clinical Social Worker  Care Plan for Problem One  Active  [facility] Long Term Goal   Patient  will have a decrease in symptoms of depression through counseling and supportive services, within the next 45 days.  THN Long Term Goal Start Date  04/30/18  Interventions for Problem One Long Term Goal  Patient will review literature on Sign and Symptoms of Depression, provided to her by CSW.  THN CM Short Term Goal #1   Patient will meet with CSW for an initial home visit to receive counseling and supportive services, within the next week.  THN CM Short Term Goal #1 Start Date  04/30/18  Interventions for Short Term Goal #1  An initial home visit has been scheduled with patient for Wednesday, May 05, 2018 at 11:00AM for CSW to offer counseling and supportive services.  THN CM Short Term Goal #2   Patient will practice deep breathing exercises and relaxation techniques to help cope with symptoms of depression, within the next 30 days.  THN CM Short Term Goal #2 Start Date  04/30/18  Interventions for Short Term Goal #2  CSW will teach patient relaxation techniques and deep breathing exercises to help cope with symptoms of depression.  THN CM Short Term Goal #3  Patient will review list of therapists/counselors provided to her by CSW and decide on an appropriate counselor of choice, within the next 30 days.  THN CM Short Term Goal #3 Start Date  04/30/18  Interventions for Short Tern Goal #3  CSW will provide patient with a list of counselors/therapists that accept [facility], encouraging patient to decide on a therapist of choice.     [name], BSW, MSW, [employer]  Licensed Holiday representative  [place]  Mailing [place] [address] [place] [address] Physical Address-[address], [place], [place] [address] Toll Free Main # [phone] Fax # [phone] Cell # [phone]  Office # [phone] [email or web address]

## 2018-04-30 NOTE — Patient Outreach (Signed)
[facility]) Care Management  04/30/2018  Jasmine [name] 03-24-1946 [id number]  BSW mailed patient community resources on today's date as requested by CSW [employer]  Daneen Schick, BSW, CDP [facility] [phone]

## 2018-04-30 NOTE — Patient Instructions (Addendum)
Your ears were irrigated of wax today  OK to take the lasix 40 mg daily for swelling or wt gain of 3-5 lbs  Please take all new medication as prescribed - the tramadol  Please continue all other medications as before, and refills have been done if requested.  Please have the pharmacy call with any other refills you may need.  Please keep your appointments with your specialists as you may have planned  Please return in 3 months, or sooner if needed

## 2018-04-30 NOTE — Assessment & Plan Note (Signed)
Ok for lasix 40 qd prn swelling or wt increase

## 2018-05-02 DIAGNOSIS — I2781 Cor pulmonale (chronic): Secondary | ICD-10-CM | POA: Diagnosis not present

## 2018-05-02 DIAGNOSIS — E43 Unspecified severe protein-calorie malnutrition: Secondary | ICD-10-CM | POA: Diagnosis not present

## 2018-05-02 DIAGNOSIS — J449 Chronic obstructive pulmonary disease, unspecified: Secondary | ICD-10-CM | POA: Diagnosis not present

## 2018-05-02 DIAGNOSIS — R7881 Bacteremia: Secondary | ICD-10-CM | POA: Diagnosis not present

## 2018-05-02 DIAGNOSIS — F418 Other specified anxiety disorders: Secondary | ICD-10-CM | POA: Diagnosis not present

## 2018-05-02 DIAGNOSIS — J9612 Chronic respiratory failure with hypercapnia: Secondary | ICD-10-CM | POA: Diagnosis not present

## 2018-05-02 DIAGNOSIS — J9690 Respiratory failure, unspecified, unspecified whether with hypoxia or hypercapnia: Secondary | ICD-10-CM | POA: Diagnosis not present

## 2018-05-02 DIAGNOSIS — E890 Postprocedural hypothyroidism: Secondary | ICD-10-CM | POA: Diagnosis not present

## 2018-05-02 DIAGNOSIS — B9561 Methicillin susceptible Staphylococcus aureus infection as the cause of diseases classified elsewhere: Secondary | ICD-10-CM | POA: Diagnosis not present

## 2018-05-03 DIAGNOSIS — I5030 Unspecified diastolic (congestive) heart failure: Secondary | ICD-10-CM | POA: Diagnosis not present

## 2018-05-03 DIAGNOSIS — F329 Major depressive disorder, single episode, unspecified: Secondary | ICD-10-CM | POA: Diagnosis not present

## 2018-05-03 DIAGNOSIS — F419 Anxiety disorder, unspecified: Secondary | ICD-10-CM | POA: Diagnosis not present

## 2018-05-03 DIAGNOSIS — I110 Hypertensive heart disease with heart failure: Secondary | ICD-10-CM | POA: Diagnosis not present

## 2018-05-03 DIAGNOSIS — J9612 Chronic respiratory failure with hypercapnia: Secondary | ICD-10-CM | POA: Diagnosis not present

## 2018-05-03 DIAGNOSIS — J449 Chronic obstructive pulmonary disease, unspecified: Secondary | ICD-10-CM | POA: Diagnosis not present

## 2018-05-03 DIAGNOSIS — I2781 Cor pulmonale (chronic): Secondary | ICD-10-CM | POA: Diagnosis not present

## 2018-05-03 DIAGNOSIS — G9341 Metabolic encephalopathy: Secondary | ICD-10-CM | POA: Diagnosis not present

## 2018-05-03 DIAGNOSIS — M810 Age-related osteoporosis without current pathological fracture: Secondary | ICD-10-CM | POA: Diagnosis not present

## 2018-05-03 NOTE — Telephone Encounter (Signed)
Pt has been informed and expressed understanding.  

## 2018-05-04 ENCOUNTER — Encounter: Payer: Self-pay | Admitting: Adult Health

## 2018-05-04 ENCOUNTER — Ambulatory Visit (INDEPENDENT_AMBULATORY_CARE_PROVIDER_SITE_OTHER): Payer: Medicare HMO | Admitting: Adult Health

## 2018-05-04 ENCOUNTER — Other Ambulatory Visit (INDEPENDENT_AMBULATORY_CARE_PROVIDER_SITE_OTHER): Payer: Medicare HMO

## 2018-05-04 VITALS — BP 104/60 | HR 71 | Ht 60.0 in | Wt 111.4 lb

## 2018-05-04 DIAGNOSIS — J441 Chronic obstructive pulmonary disease with (acute) exacerbation: Secondary | ICD-10-CM

## 2018-05-04 DIAGNOSIS — J9611 Chronic respiratory failure with hypoxia: Secondary | ICD-10-CM

## 2018-05-04 DIAGNOSIS — J431 Panlobular emphysema: Secondary | ICD-10-CM | POA: Diagnosis not present

## 2018-05-04 DIAGNOSIS — J9612 Chronic respiratory failure with hypercapnia: Secondary | ICD-10-CM | POA: Diagnosis not present

## 2018-05-04 DIAGNOSIS — I5032 Chronic diastolic (congestive) heart failure: Secondary | ICD-10-CM

## 2018-05-04 DIAGNOSIS — J449 Chronic obstructive pulmonary disease, unspecified: Secondary | ICD-10-CM

## 2018-05-04 LAB — BASIC METABOLIC PANEL
BUN: 10 mg/dL (ref 6–23)
CALCIUM: 9.9 mg/dL (ref 8.4–10.5)
CO2: 47 mEq/L — ABNORMAL HIGH (ref 19–32)
CREATININE: 0.75 mg/dL (ref 0.40–1.20)
Chloride: 94 mEq/L — ABNORMAL LOW (ref 96–112)
GFR: 97.28 mL/min (ref >60.00)
Glucose, Bld: 94 mg/dL (ref 70–99)
Potassium: 4.5 mEq/L (ref 3.5–5.1)
Sodium: 144 mEq/L (ref 135–145)

## 2018-05-04 NOTE — Assessment & Plan Note (Signed)
Continue on Lasix. Bmet pendign

## 2018-05-04 NOTE — Assessment & Plan Note (Signed)
Very severe COPD complicated by hypercarbic respiratory failure and diastolic heart failure.  Patient is prone to exacerbations.  She has polysubstance abuse history.  Cessation counseling was given. For now patient is continue on Breo and Spiriva.  She may continue on noninvasive vent support at bedtime.  Compliance report requested.  Patient is encouraged on compliance  Plan  Patient Instructions  Continue on Spiriva 1 puff daily  Continue on BREO daily  Rinse after use.  Continue on lasix 40mg  daily .  Continue on non-invasive Vent At bedtime  And with naps.  Compliance report  Order for new mask .  Continue on Oxygen -3l/m at rest and 4l/m walking .  Labs today .  Follow up Dr. Elsworth Soho  In 2-3- months and  As needed    Please contact office for sooner follow up if symptoms do not improve or worsen or seek emergency care

## 2018-05-04 NOTE — Assessment & Plan Note (Signed)
Chronic hypoxic and hypercarbic respiratory failure  should continue on oxygen to keep O2 saturations greater than 88 to 90% Continue on noninvasive vent support at bedtime.  Check  bmet.  Check compliance report

## 2018-05-04 NOTE — Progress Notes (Signed)
@Patient  ID: Jasmine [name], female    DOB: 30-Mar-1946, 72 y.o.   MRN: [id number]  Chief Complaint  Patient presents with  . Follow-up    COPD     Referring provider: Biagio Borg, MD  HPI: [id number].oformersmoker for FU of COPD, hypoxic and hypercarbic respiratory failure , Lung nodules .Seen for initial pulmonary eval 02/03/13  She was started on O2 in march 2014 &uses 3 liters 02 continuously and w/ sleep, and 4L pulsed with activity.  She has smoked Since her 55's - 52 Pyrs. Quit 07/21/2015. Ongoingcocaine use/polysubstance abuse Husband passed away Jan 30, 2016 while she was hospitalized.  TEST CT chest 10/2011 >>showed Moderate centrilobular emphysema. Small bilateral pleural effusions with dependent atelectasis  CT chest 07/2016-resolving right upper lobe consolidation,spiculated subcentimeter nodules that have been noted before with low-grade PET hypermetabolism CT chest February 2019 stable right upper lobe scarring, stable bilateral pulmonary nodules, emphysema   PFT >FEV1 at 0.47 L( 30%), ratio 39 , No BD response (<200cc)  2014 completed Pulm Rehab  05/04/2018 Follow up ; COPD , hypercarbic and hypoxic respiratory failure Patient presents for a follow-up.  Patient has had several hospitalizations since last visit in May this year.  She was admitted for COPD exacerbation and acute on chronic hypercarbic respiratory failure.  Along with decompensated diastolic heart failure.  Last admission patient was started on noninvasive vent support at bedtime.  She says she wears it on occasion.  Does feel that the mask irritates the top part of her nose.  This discharge patient says she is feeling better.  She has decreased shortness of breath and cough. She remains on Brio and Spiriva.  Says she has not used cocaine in greater than 3 months.. She denies any hemoptysis chest pain orthopnea PND or increased leg swelling.  Patient says she did have some increased lower  extremity swelling last admission.  Her Lasix was restarted which seems to have helped.  Allergies  Allergen Reactions  . Alendronate Sodium Other (See Comments)    Patient does not remember this reaction    Immunization History  Administered Date(s) Administered  . Influenza Split 07/10/2011, 08/18/2012  . Influenza Whole 07/21/2008, 08/23/2009, 10/10/2010  . Influenza, High Dose Seasonal PF 07/10/2016, 09/03/2017  . Influenza,inj,Quad PF,6+ Mos 08/12/2013, 07/10/2014  . Pneumococcal Conjugate-13 11/30/2014  . Pneumococcal Polysaccharide-23 07/21/2007, 01/12/2013  . Td 08/23/2009  . Zoster 07/20/2006    Past Medical History:  Diagnosis Date  . ALLERGIC RHINITIS 05/01/2007  . Anxiety   . Arthritis    "hands" (12/28/2015)  . ASTHMA 05/01/2007   "since I was a child"  . Cancer (HCC)    LUNG  . CHF (congestive heart failure) ([place])   . Chronic bronchitis ([place])   . COPD (chronic obstructive pulmonary disease) ([place])   . DEPRESSION 07/21/2008  . DVT (deep venous thrombosis) (HCC) 1960s   LLE  . FATIGUE 10/10/2010  . HYPERTENSION 05/01/2007  . HYPERTHYROIDISM 11/23/2007   Pt endorses having had Graves disease, possibly radioactive iodine x1, but no thyroidectomy  . HYPOTHYROIDISM 08/23/2009  . LUMBAR RADICULOPATHY, RIGHT 08/25/2008  . On home oxygen therapy    "3-4L; qd; all the time" (12/28/2015)  . OSTEOPOROSIS 05/01/2007  . SHOULDER PAIN, LEFT 08/23/2009  . SINUSITIS, CHRONIC 10/10/2010    Tobacco History: Social History   Tobacco Use  Smoking Status Former Smoker  . Packs/day: 1.00  . Years: 54.00  . Pack years: 54.00  . Types: Cigarettes  . Last attempt to quit:  07/21/2015  . Years since quitting: 2.7  Smokeless Tobacco Never Used   Counseling given: Not Answered   Outpatient Medications Prior to Visit  Medication Sig Dispense Refill  . albuterol (PROAIR HFA) 108 (90 Base) MCG/ACT inhaler Inhale 1-2 puffs into the lungs every 6 (six) hours as needed for wheezing or  shortness of breath. 1 Inhaler 5  . aspirin EC 81 MG EC tablet Take 1 tablet (81 mg total) by mouth daily. 30 tablet 0  . citalopram (CELEXA) 20 MG tablet TAKE 1 TABLET BY MOUTH DAILY (Patient taking differently: Take 20 mg by mouth once a day) 90 tablet 3  . feeding supplement, ENSURE ENLIVE, (ENSURE ENLIVE) LIQD Take 237 mLs by mouth 3 (three) times daily between meals. 237 mL 12  . fluticasone furoate-vilanterol (BREO ELLIPTA) 200-25 MCG/INH AEPB Inhale 1 puff into the lungs daily. 1 each 5  . furosemide (LASIX) 40 MG tablet Take 1 tablet (40 mg total) by mouth daily as needed for edema. 90 tablet 3  . hydroxypropyl methylcellulose / hypromellose (ISOPTO TEARS / GONIOVISC) 2.5 % ophthalmic solution Place 1 drop into both eyes as needed for dry eyes. 15 mL 2  . ipratropium-albuterol (DUONEB) 0.5-2.5 (3) MG/3ML SOLN Take 3 mLs by nebulization 2 (two) times daily. 360 mL 3  . levothyroxine (SYNTHROID, LEVOTHROID) 75 MCG tablet TAKE 1 TABLET BY MOUTH DAILY BEFORE BREAKFAST. 90 tablet 0  . OXYGEN Inhale 4 L into the lungs continuous.     . traMADol (ULTRAM) 50 MG tablet Take 1 tablet (50 mg total) by mouth 2 (two) times daily as needed. 60 tablet 2   No facility-administered medications prior to visit.      Review of Systems  Constitutional:   No  weight loss, night sweats,  Fevers, chills, + fatigue, or  lassitude.  HEENT:   No headaches,  Difficulty swallowing,  Tooth/dental problems, or  Sore throat,                No sneezing, itching, ear ache, nasal congestion, post nasal drip,   CV:  No chest pain,  Orthopnea, PND, swelling in lower extremities, anasarca, dizziness, palpitations, syncope.   GI  No heartburn, indigestion, abdominal pain, nausea, vomiting, diarrhea, change in bowel habits, loss of appetite, bloody stools.   Resp:  No chest wall deformity  Skin: no rash or lesions.  GU: no dysuria, change in color of urine, no urgency or frequency.  No flank pain, no hematuria    MS:  No joint pain or swelling.  No decreased range of motion.  No back pain.    Physical Exam  BP 104/60 (BP Location: Left Arm, Cuff Size: Normal)   Pulse 71   Ht 5' (1.524 m)   Wt 111 lb 6.4 oz (50.5 kg)   SpO2 94%   BMI 21.76 kg/m   GEN: A/Ox3; pleasant , NAD, eldelry on O2    HEENT:  [place]/AT,  EACs-clear, TMs-wnl, NOSE-clear, THROAT-clear, no lesions, no postnasal drip or exudate noted.   NECK:  Supple w/ fair ROM; no JVD; normal carotid impulses w/o bruits; no thyromegaly or nodules palpated; no lymphadenopathy.    RESP  Decreased bs in bases  no accessory muscle use, no dullness to percussion  CARD:  RRR, no m/r/g, tr  peripheral edema, pulses intact, no cyanosis or clubbing.  GI:   Soft & nt; nml bowel sounds; no organomegaly or masses detected.   Musco: Warm bil, no deformities or joint swelling noted.  Neuro: alert, no focal deficits noted.    Skin: Warm, no lesions or rashes    Lab Results:  CBC  BMET  BNP   Imaging: Dg Chest 2 View  Result Date: 04/21/2018 CLINICAL DATA:  Shortness of breath EXAM: CHEST - 2 VIEW COMPARISON:  March 27, 2018 FINDINGS: There is scarring in the right upper lobe. Lungs are somewhat hyperexpanded. There is no appreciable edema or consolidation. Heart size and pulmonary vascular normal. There is aortic atherosclerosis. No adenopathy. No bone lesions. IMPRESSION: Scarring right upper lobe. Lungs hyperexpanded. No edema or consolidation. Stable cardiac silhouette. There is aortic atherosclerosis. Aortic Atherosclerosis (ICD10-I70.0). Electronically Signed   By: Lowella Grip III M.D.   On: 04/21/2018 12:39     Assessment & Plan:   COPD (chronic obstructive pulmonary disease) ([place]) Very severe COPD complicated by hypercarbic respiratory failure and diastolic heart failure.  Patient is prone to exacerbations.  She has polysubstance abuse history.  Cessation counseling was given. For now patient is continue on Breo and  Spiriva.  She may continue on noninvasive vent support at bedtime.  Compliance report requested.  Patient is encouraged on compliance  Plan  Patient Instructions  Continue on Spiriva 1 puff daily  Continue on BREO daily  Rinse after use.  Continue on lasix 40mg  daily .  Continue on non-invasive Vent At bedtime  And with naps.  Compliance report  Order for new mask .  Continue on Oxygen -3l/m at rest and 4l/m walking .  Labs today .  Follow up Dr. Elsworth Soho  In 2-3- months and  As needed    Please contact office for sooner follow up if symptoms do not improve or worsen or seek emergency care                Chronic diastolic congestive heart failure ([place]) Continue on Lasix. Bmet pendign    Respiratory failure with hypercapnia ([place]) Chronic hypoxic and hypercarbic respiratory failure  should continue on oxygen to keep O2 saturations greater than 88 to 90% Continue on noninvasive vent support at bedtime.  Check  bmet.  Check compliance report      Rexene Edison, NP 05/04/2018

## 2018-05-04 NOTE — Patient Instructions (Addendum)
Continue on Spiriva 1 puff daily  Continue on BREO daily  Rinse after use.  Continue on lasix 40mg  daily .  Continue on non-invasive Vent At bedtime  And with naps.  Compliance report  Order for new mask .  Continue on Oxygen -3l/m at rest and 4l/m walking .  Labs today .  Follow up Dr. Elsworth Soho  In 2-3- months and  As needed    Please contact office for sooner follow up if symptoms do not improve or worsen or seek emergency care

## 2018-05-04 NOTE — Addendum Note (Signed)
Addended by: Karmen Stabs on: 05/04/2018 04:16 PM   Modules accepted: Orders

## 2018-05-04 NOTE — Addendum Note (Signed)
Addended by: Madolyn Frieze on: 05/04/2018 04:31 PM   Modules accepted: Orders

## 2018-05-05 ENCOUNTER — Other Ambulatory Visit: Payer: Self-pay | Admitting: *Deleted

## 2018-05-05 DIAGNOSIS — F419 Anxiety disorder, unspecified: Secondary | ICD-10-CM | POA: Diagnosis not present

## 2018-05-05 DIAGNOSIS — R911 Solitary pulmonary nodule: Secondary | ICD-10-CM | POA: Diagnosis not present

## 2018-05-05 DIAGNOSIS — M810 Age-related osteoporosis without current pathological fracture: Secondary | ICD-10-CM | POA: Diagnosis not present

## 2018-05-05 DIAGNOSIS — I110 Hypertensive heart disease with heart failure: Secondary | ICD-10-CM | POA: Diagnosis not present

## 2018-05-05 DIAGNOSIS — J449 Chronic obstructive pulmonary disease, unspecified: Secondary | ICD-10-CM | POA: Diagnosis not present

## 2018-05-05 DIAGNOSIS — I5030 Unspecified diastolic (congestive) heart failure: Secondary | ICD-10-CM | POA: Diagnosis not present

## 2018-05-05 DIAGNOSIS — I2781 Cor pulmonale (chronic): Secondary | ICD-10-CM | POA: Diagnosis not present

## 2018-05-05 DIAGNOSIS — F329 Major depressive disorder, single episode, unspecified: Secondary | ICD-10-CM | POA: Diagnosis not present

## 2018-05-05 DIAGNOSIS — G9341 Metabolic encephalopathy: Secondary | ICD-10-CM | POA: Diagnosis not present

## 2018-05-05 DIAGNOSIS — J9612 Chronic respiratory failure with hypercapnia: Secondary | ICD-10-CM | POA: Diagnosis not present

## 2018-05-05 DIAGNOSIS — J9611 Chronic respiratory failure with hypoxia: Secondary | ICD-10-CM | POA: Diagnosis not present

## 2018-05-05 DIAGNOSIS — R300 Dysuria: Secondary | ICD-10-CM | POA: Diagnosis not present

## 2018-05-05 NOTE — Patient Outreach (Signed)
[facility]) Care Management  05/05/2018  AVENELL SELLERS 23-Nov-1945 [id number]   CSW was able to meet with patient today to perform the initial home visit to offer counseling and supportive services for patient's symptoms of depression.  Also present during the first part of patient's visit was a home health nurse through [facility] to check patient's vitals and current medication list.  Patient is also receiving home health physical therapy through [facility].  Patient does not feel as though she needs home health aide services at this time, as she is currently able to perform all activities of daily living independently.    Patient reported that she is back with her first husband, [name] after the death of her second husband.  Patient indicated that [name] is currently "holding a chip on his shoulder" because patient left him for her second husband, but that they are currently "working through their issues".  Patient indicated that [name] is very supportive of her and "waits on her hand and foot".  Patient went on to say that [name] cleans the house, does all the laundry, performs all the grocery shopping, prepares all the meals, etc.  Patient stated, "We are great friends and roommates, we really look after each other".  Patient believes that her depression stems from the guilt that she has for leaving [name] for her second husband.  Patient indicated that she and [name] are working through some "trust issues" because [name] fears that patient will leave him again for another man.  Patient reports that they have met with the minister at their church regarding their marital discord.  Patient believes that she has grown in her faith as a result of all the circumstances in her life.  Patient stated that she communicates a lot better with [name], expressing her feelings more openly and honestly.  CSW was able to teach patient deep breathing exercises and relaxation  techniques for when she experiences symptoms of anxiety and depression.  CSW also reviewed EMMI information with patient pertaining to "Signs and Symptoms of Depression".  Patient talked a lot about her Faith and religion and how she "lays everything at [employer]".  Patient indicated that she could never harm herself because she knows what it would do to her family members and loved ones, "not to mention, it's a sin".  Patient relies heavily on prayer, meditation and reading the Bible to "get her through the tough times in life".  CSW provided patient with a list of counselors/therapists, encouraging patient to decide on a counselor of interest.  CSW agreed to offer counseling and supportive services to patient until she is able to get established with a counselor/therapist.  CSW will meet with patient for the next home visit on Wednesday, May 19, 2018 at 10:00AM.  CSW was able to ensure that patient has the correct contact information for CSW, encouraging patient to contact CSW directly when and if social work needs arise.  Patient voiced understanding and was agreeable to this plan.  THN CM Care Plan Problem One     Most Recent Value  Care Plan Problem One  Patient is experiencing signs and symptoms of depression.  Role Documenting the Problem One  Clinical Social Worker  Care Plan for Problem One  Active  [facility] Long Term Goal   Patient will have a decrease in symptoms of depression through counseling and supportive services, within the next 45 days.  THN Long Term Goal Start Date  04/30/18  THN CM Short Term Goal #1   Patient will meet with CSW for an initial home visit to receive counseling and supportive services, within the next week.  THN CM Short Term Goal #1 Start Date  04/30/18  [facility] CM Short Term Goal #1 Met Date  05/05/18  Interventions for Short Term Goal #1  CSW was able to meet with patient today to perform the initial home visit to provide counseling and supportive services.  THN CM  Short Term Goal #2   Patient will practice deep breathing exercises and relaxation techniques to help cope with symptoms of depression, within the next 30 days.  THN CM Short Term Goal #2 Start Date  04/30/18  [facility] CM Short Term Goal #2 Met Date  05/05/18  Interventions for Short Term Goal #2  CSW was able to teach patient deep breathing exercises and relaxation techniques to practice when she experiences symptoms of anxiety and depression.  THN CM Short Term Goal #3  Patient will review list of therapists/counselors provided to her by CSW and decide on an appropriate counselor of choice, within the next 30 days.  THN CM Short Term Goal #3 Start Date  04/30/18    [name], BSW, MSW, [place]  Licensed Clinical Social Worker  [place]  Mailing [place]. [address] [place], [place] [address] Physical Address-[address], [place], [place] [address] Toll Free Main # [phone] Fax # [phone] Cell # [phone]  Office # [phone] [email or web address]

## 2018-05-06 ENCOUNTER — Telehealth: Payer: Self-pay | Admitting: Pulmonary Disease

## 2018-05-06 NOTE — Telephone Encounter (Signed)
Called and spoke with pt regarding prior message she rec'd  Pt stated that it was an older message, nothing needed today. appt for RA already scheduled for 07/15/2018 for 64mo f/u Nothing further needed

## 2018-05-06 NOTE — Telephone Encounter (Signed)
Called and spoke with patient, she wanted to go over lab work again. Results given. Nothing further needed.

## 2018-05-07 DIAGNOSIS — J449 Chronic obstructive pulmonary disease, unspecified: Secondary | ICD-10-CM | POA: Diagnosis not present

## 2018-05-07 DIAGNOSIS — F419 Anxiety disorder, unspecified: Secondary | ICD-10-CM | POA: Diagnosis not present

## 2018-05-07 DIAGNOSIS — I2781 Cor pulmonale (chronic): Secondary | ICD-10-CM | POA: Diagnosis not present

## 2018-05-07 DIAGNOSIS — J9612 Chronic respiratory failure with hypercapnia: Secondary | ICD-10-CM | POA: Diagnosis not present

## 2018-05-07 DIAGNOSIS — I5030 Unspecified diastolic (congestive) heart failure: Secondary | ICD-10-CM | POA: Diagnosis not present

## 2018-05-07 DIAGNOSIS — G9341 Metabolic encephalopathy: Secondary | ICD-10-CM | POA: Diagnosis not present

## 2018-05-07 DIAGNOSIS — I110 Hypertensive heart disease with heart failure: Secondary | ICD-10-CM | POA: Diagnosis not present

## 2018-05-07 DIAGNOSIS — M810 Age-related osteoporosis without current pathological fracture: Secondary | ICD-10-CM | POA: Diagnosis not present

## 2018-05-07 DIAGNOSIS — F329 Major depressive disorder, single episode, unspecified: Secondary | ICD-10-CM | POA: Diagnosis not present

## 2018-05-12 DIAGNOSIS — F329 Major depressive disorder, single episode, unspecified: Secondary | ICD-10-CM | POA: Diagnosis not present

## 2018-05-12 DIAGNOSIS — J9612 Chronic respiratory failure with hypercapnia: Secondary | ICD-10-CM | POA: Diagnosis not present

## 2018-05-12 DIAGNOSIS — I2781 Cor pulmonale (chronic): Secondary | ICD-10-CM | POA: Diagnosis not present

## 2018-05-12 DIAGNOSIS — I5030 Unspecified diastolic (congestive) heart failure: Secondary | ICD-10-CM | POA: Diagnosis not present

## 2018-05-12 DIAGNOSIS — F419 Anxiety disorder, unspecified: Secondary | ICD-10-CM | POA: Diagnosis not present

## 2018-05-12 DIAGNOSIS — I110 Hypertensive heart disease with heart failure: Secondary | ICD-10-CM | POA: Diagnosis not present

## 2018-05-12 DIAGNOSIS — J449 Chronic obstructive pulmonary disease, unspecified: Secondary | ICD-10-CM | POA: Diagnosis not present

## 2018-05-12 DIAGNOSIS — M810 Age-related osteoporosis without current pathological fracture: Secondary | ICD-10-CM | POA: Diagnosis not present

## 2018-05-12 DIAGNOSIS — G9341 Metabolic encephalopathy: Secondary | ICD-10-CM | POA: Diagnosis not present

## 2018-05-13 DIAGNOSIS — F329 Major depressive disorder, single episode, unspecified: Secondary | ICD-10-CM | POA: Diagnosis not present

## 2018-05-13 DIAGNOSIS — F419 Anxiety disorder, unspecified: Secondary | ICD-10-CM | POA: Diagnosis not present

## 2018-05-13 DIAGNOSIS — M810 Age-related osteoporosis without current pathological fracture: Secondary | ICD-10-CM | POA: Diagnosis not present

## 2018-05-13 DIAGNOSIS — I110 Hypertensive heart disease with heart failure: Secondary | ICD-10-CM | POA: Diagnosis not present

## 2018-05-13 DIAGNOSIS — J449 Chronic obstructive pulmonary disease, unspecified: Secondary | ICD-10-CM | POA: Diagnosis not present

## 2018-05-13 DIAGNOSIS — G9341 Metabolic encephalopathy: Secondary | ICD-10-CM | POA: Diagnosis not present

## 2018-05-13 DIAGNOSIS — I2781 Cor pulmonale (chronic): Secondary | ICD-10-CM | POA: Diagnosis not present

## 2018-05-13 DIAGNOSIS — I5030 Unspecified diastolic (congestive) heart failure: Secondary | ICD-10-CM | POA: Diagnosis not present

## 2018-05-13 DIAGNOSIS — J9612 Chronic respiratory failure with hypercapnia: Secondary | ICD-10-CM | POA: Diagnosis not present

## 2018-05-14 ENCOUNTER — Telehealth: Payer: Self-pay | Admitting: Internal Medicine

## 2018-05-14 ENCOUNTER — Other Ambulatory Visit: Payer: Self-pay

## 2018-05-14 NOTE — Telephone Encounter (Signed)
Copied from [place] [id number]. Topic: EmmiPrevent >> May 14, 2018  2:29 PM Margot Ables wrote: Reason for CRM: pt received Emmi call and is wanting to try the cologuard. Please f/u.

## 2018-05-14 NOTE — Patient Outreach (Signed)
[facility]) Care Management  05/14/2018  Jasmine [name] 1946/02/05 [id number]   72 year old with history of, but not all inclusive: COPD, Heart failure, depression, HTN, asthma, COPD, cor [place], protein calorie malnutrition, hypothyroidism, hyperglycemia, polysubstance abuse, acute encephalopathy, anxiety.  4 admissions in 6 months and 1 Emergency room visit in 6 months.  RNCM called to follow up. Client reports, "Things are going pretty good". She reports she followed up with pulmonology. She denies any questions or concerns. Discussed upcoming appointment.  RNCM confirmed client has RNCM's and 24 hour nurse advice line contact number and encouraged client to call as needed.  Plan: home visit next month.  Thea Silversmith, RN, MSN, [place] Coordinator Cell: [phone]

## 2018-05-17 NOTE — Telephone Encounter (Signed)
Pt has been informed and expressed understanding.  

## 2018-05-17 NOTE — Telephone Encounter (Signed)
OK to please ask pt to check with insurance; if ok with insurance and ok with pt, ok for cologuard

## 2018-05-18 DIAGNOSIS — I5030 Unspecified diastolic (congestive) heart failure: Secondary | ICD-10-CM | POA: Diagnosis not present

## 2018-05-18 DIAGNOSIS — M810 Age-related osteoporosis without current pathological fracture: Secondary | ICD-10-CM | POA: Diagnosis not present

## 2018-05-18 DIAGNOSIS — I110 Hypertensive heart disease with heart failure: Secondary | ICD-10-CM | POA: Diagnosis not present

## 2018-05-18 DIAGNOSIS — F329 Major depressive disorder, single episode, unspecified: Secondary | ICD-10-CM | POA: Diagnosis not present

## 2018-05-18 DIAGNOSIS — I2781 Cor pulmonale (chronic): Secondary | ICD-10-CM | POA: Diagnosis not present

## 2018-05-18 DIAGNOSIS — F419 Anxiety disorder, unspecified: Secondary | ICD-10-CM | POA: Diagnosis not present

## 2018-05-18 DIAGNOSIS — G9341 Metabolic encephalopathy: Secondary | ICD-10-CM | POA: Diagnosis not present

## 2018-05-18 DIAGNOSIS — J449 Chronic obstructive pulmonary disease, unspecified: Secondary | ICD-10-CM | POA: Diagnosis not present

## 2018-05-18 DIAGNOSIS — J9612 Chronic respiratory failure with hypercapnia: Secondary | ICD-10-CM | POA: Diagnosis not present

## 2018-05-19 ENCOUNTER — Other Ambulatory Visit: Payer: Self-pay | Admitting: *Deleted

## 2018-05-19 NOTE — Patient Outreach (Signed)
[facility]) Care Management  05/19/2018  AILYN GLADD Dec 11, 1945 [id number]   CSW was able to meet with patient today to perform a routine home visit to provide counseling and supportive services for symptoms of depression.  Patient appeared to be in good spirits today and reports doing well.  Patient admitted to practicing her deep breathing exercises and relaxation techniques to better equip herself for when she begins to experience symptoms of depression and/or anxiety. Patient indicated that she is still working with therapies in the home, both physical and occupational, through [facility], but believes that services will be terminated soon.  Patient admitted that she is able to ambulate a lot better and she definitely feels more steady on her feet.  Patient reported that she is taking her medications exactly as prescribed and that she is trying to eat a healthier diet. Patient admitted that she has still not had an opportunity to talk with her boyfriend/ex-husband about counseling services through [place] with [facility] [employer].  Patient fears that her boyfriend/ex-husband may not be very receptive to the idea; therefore, she has not broached the topic with him.  Patient agreed to have this discussion with her boyfriend/ex-husband so that CSW can make arrangements to meet with both of them during the next routine home visit. Patient indicated that there is still a great deal of animosity between her and her boyfriend/ex-husband because patient left him to be with her now deceased husband.  Patient reports that she is constantly trying to relieve his fears about her leaving him again for another man. Patient admits that it is "rather exhausting", as she would just live to enjoy the fact that they are reunited again.  CSW explained to patient that CSW is more than willing to address this concern during a visit with patient and her  boyfriend/ex-husband. CSW has made arrangements to meet with patient and her boyfriend/ex-husband for a routine home visit on Thursday, May 27, 2018 at 11:00AM, at which time, CSW will provide couples counseling.  In the meantime, patient agreed to talk with her boyfriend/ex-husband about counseling services, as she fears that he will not be very receptive.  Patient has CSW's contact information and has been encouraged to contact CSW directly if anything changes.  Patient voiced understanding and was agreeable to this plan. THN CM Care Plan Problem One     Most Recent Value  Care Plan Problem One  Patient is experiencing signs and symptoms of depression.  Role Documenting the Problem One  Clinical Social Worker  Care Plan for Problem One  Active  [facility] Long Term Goal   Patient will have a decrease in symptoms of depression through counseling and supportive services, within the next 45 days.  THN Long Term Goal Start Date  04/30/18  [facility] CM Short Term Goal #1   Patient will meet with CSW for an initial home visit to receive counseling and supportive services, within the next week.  THN CM Short Term Goal #1 Start Date  04/30/18  THN CM Short Term Goal #1 Met Date  05/05/18  THN CM Short Term Goal #2   Patient will practice deep breathing exercises and relaxation techniques to help cope with symptoms of depression, within the next 30 days.  THN CM Short Term Goal #2 Start Date  04/30/18  [facility] CM Short Term Goal #2 Met Date  05/05/18  [facility] CM Short Term Goal #3  Patient will review list of therapists/counselors provided  to her by CSW and decide on an appropriate counselor of choice, within the next 30 days.  THN CM Short Term Goal #3 Start Date  04/30/18  [facility] CM Short Term Goal #3 Met Date  05/19/18  Interventions for Short [place] Goal #3  Patient reports that she is not interested in counseling services beyond what CSW is offering in the home, at this time.  THN CM Short Term Goal #4  Patient and  patient's ex-husband will meet with CSW for a routine home visit so that CSW can offer couples counseling and supportive services, within the next week.  THN CM Short Term Goal #4 Start Date  05/19/18  Interventions for Short Term Goal #4  CSW will meet with patient and patient's ex-husband on Thursday, May 27, 2018 to perform a routine home visit to offer couples counseling and supportive services.     [name], BSW, MSW, LCSW  Licensed Education officer, [facility]  Mailing [place] [address] [place], [place] [address] Physical Address-[address], [place], [place] [address] Toll Free Main # [phone] Fax # [phone] Cell # [phone]  Office # [phone] [email or web address]

## 2018-05-20 DIAGNOSIS — J449 Chronic obstructive pulmonary disease, unspecified: Secondary | ICD-10-CM | POA: Diagnosis not present

## 2018-05-20 DIAGNOSIS — I110 Hypertensive heart disease with heart failure: Secondary | ICD-10-CM | POA: Diagnosis not present

## 2018-05-20 DIAGNOSIS — F329 Major depressive disorder, single episode, unspecified: Secondary | ICD-10-CM | POA: Diagnosis not present

## 2018-05-20 DIAGNOSIS — I2781 Cor pulmonale (chronic): Secondary | ICD-10-CM | POA: Diagnosis not present

## 2018-05-20 DIAGNOSIS — J9612 Chronic respiratory failure with hypercapnia: Secondary | ICD-10-CM | POA: Diagnosis not present

## 2018-05-20 DIAGNOSIS — M810 Age-related osteoporosis without current pathological fracture: Secondary | ICD-10-CM | POA: Diagnosis not present

## 2018-05-20 DIAGNOSIS — G9341 Metabolic encephalopathy: Secondary | ICD-10-CM | POA: Diagnosis not present

## 2018-05-20 DIAGNOSIS — F419 Anxiety disorder, unspecified: Secondary | ICD-10-CM | POA: Diagnosis not present

## 2018-05-20 DIAGNOSIS — I5030 Unspecified diastolic (congestive) heart failure: Secondary | ICD-10-CM | POA: Diagnosis not present

## 2018-05-21 ENCOUNTER — Telehealth: Payer: Self-pay | Admitting: Internal Medicine

## 2018-05-21 NOTE — Telephone Encounter (Signed)
Jasmine [name] with [facility] advised

## 2018-05-21 NOTE — Telephone Encounter (Signed)
Copied from [place] [phone]. Topic: Inquiry >> May 21, 2018  9:25 AM Pricilla Handler wrote: Reason for CRM: Jerilynn with [facility] [phone]) called requesting verbal orders for PT. Patient needs three addition visits: Two visit next week, and One visit the week after. Please call Jerilynn with [facility] at [phone].  Thank You!!!

## 2018-05-21 NOTE — Telephone Encounter (Signed)
Ok for verbals 

## 2018-05-21 NOTE — Telephone Encounter (Signed)
Routing to dr john, please advise, thanks 

## 2018-05-24 ENCOUNTER — Encounter (HOSPITAL_COMMUNITY): Payer: Self-pay

## 2018-05-24 ENCOUNTER — Inpatient Hospital Stay (HOSPITAL_COMMUNITY)
Admission: EM | Admit: 2018-05-24 | Discharge: 2018-05-28 | DRG: 190 | Disposition: A | Discharge status: Home Health | Payer: Medicare HMO | Attending: Internal Medicine | Admitting: Internal Medicine

## 2018-05-24 ENCOUNTER — Emergency Department (HOSPITAL_COMMUNITY): Payer: Medicare HMO

## 2018-05-24 DIAGNOSIS — Z801 Family history of malignant neoplasm of trachea, bronchus and lung: Secondary | ICD-10-CM | POA: Diagnosis not present

## 2018-05-24 DIAGNOSIS — Z823 Family history of stroke: Secondary | ICD-10-CM

## 2018-05-24 DIAGNOSIS — J431 Panlobular emphysema: Secondary | ICD-10-CM | POA: Diagnosis not present

## 2018-05-24 DIAGNOSIS — Z7951 Long term (current) use of inhaled steroids: Secondary | ICD-10-CM

## 2018-05-24 DIAGNOSIS — Z8249 Family history of ischemic heart disease and other diseases of the circulatory system: Secondary | ICD-10-CM

## 2018-05-24 DIAGNOSIS — G9349 Other encephalopathy: Secondary | ICD-10-CM | POA: Diagnosis present

## 2018-05-24 DIAGNOSIS — F1390 Sedative, hypnotic, or anxiolytic use, unspecified, uncomplicated: Secondary | ICD-10-CM | POA: Diagnosis present

## 2018-05-24 DIAGNOSIS — I5032 Chronic diastolic (congestive) heart failure: Secondary | ICD-10-CM | POA: Diagnosis present

## 2018-05-24 DIAGNOSIS — Z86718 Personal history of other venous thrombosis and embolism: Secondary | ICD-10-CM | POA: Diagnosis not present

## 2018-05-24 DIAGNOSIS — F329 Major depressive disorder, single episode, unspecified: Secondary | ICD-10-CM | POA: Diagnosis present

## 2018-05-24 DIAGNOSIS — R410 Disorientation, unspecified: Secondary | ICD-10-CM | POA: Diagnosis not present

## 2018-05-24 DIAGNOSIS — Z9119 Patient's noncompliance with other medical treatment and regimen: Secondary | ICD-10-CM

## 2018-05-24 DIAGNOSIS — R7881 Bacteremia: Secondary | ICD-10-CM | POA: Diagnosis not present

## 2018-05-24 DIAGNOSIS — Z7982 Long term (current) use of aspirin: Secondary | ICD-10-CM

## 2018-05-24 DIAGNOSIS — R0902 Hypoxemia: Secondary | ICD-10-CM | POA: Diagnosis not present

## 2018-05-24 DIAGNOSIS — Z833 Family history of diabetes mellitus: Secondary | ICD-10-CM | POA: Diagnosis not present

## 2018-05-24 DIAGNOSIS — J9621 Acute and chronic respiratory failure with hypoxia: Secondary | ICD-10-CM | POA: Diagnosis not present

## 2018-05-24 DIAGNOSIS — I110 Hypertensive heart disease with heart failure: Secondary | ICD-10-CM | POA: Diagnosis present

## 2018-05-24 DIAGNOSIS — B9561 Methicillin susceptible Staphylococcus aureus infection as the cause of diseases classified elsewhere: Secondary | ICD-10-CM | POA: Diagnosis not present

## 2018-05-24 DIAGNOSIS — Z9981 Dependence on supplemental oxygen: Secondary | ICD-10-CM | POA: Diagnosis not present

## 2018-05-24 DIAGNOSIS — J80 Acute respiratory distress syndrome: Secondary | ICD-10-CM | POA: Diagnosis not present

## 2018-05-24 DIAGNOSIS — J9602 Acute respiratory failure with hypercapnia: Secondary | ICD-10-CM

## 2018-05-24 DIAGNOSIS — J9622 Acute and chronic respiratory failure with hypercapnia: Secondary | ICD-10-CM

## 2018-05-24 DIAGNOSIS — J9601 Acute respiratory failure with hypoxia: Secondary | ICD-10-CM | POA: Diagnosis not present

## 2018-05-24 DIAGNOSIS — F418 Other specified anxiety disorders: Secondary | ICD-10-CM | POA: Diagnosis not present

## 2018-05-24 DIAGNOSIS — J9692 Respiratory failure, unspecified with hypercapnia: Secondary | ICD-10-CM | POA: Diagnosis present

## 2018-05-24 DIAGNOSIS — I517 Cardiomegaly: Secondary | ICD-10-CM | POA: Diagnosis not present

## 2018-05-24 DIAGNOSIS — E43 Unspecified severe protein-calorie malnutrition: Secondary | ICD-10-CM | POA: Diagnosis not present

## 2018-05-24 DIAGNOSIS — Z8349 Family history of other endocrine, nutritional and metabolic diseases: Secondary | ICD-10-CM

## 2018-05-24 DIAGNOSIS — J9690 Respiratory failure, unspecified, unspecified whether with hypoxia or hypercapnia: Secondary | ICD-10-CM | POA: Diagnosis not present

## 2018-05-24 DIAGNOSIS — Z515 Encounter for palliative care: Secondary | ICD-10-CM | POA: Diagnosis not present

## 2018-05-24 DIAGNOSIS — D696 Thrombocytopenia, unspecified: Secondary | ICD-10-CM | POA: Diagnosis present

## 2018-05-24 DIAGNOSIS — R0602 Shortness of breath: Secondary | ICD-10-CM

## 2018-05-24 DIAGNOSIS — J449 Chronic obstructive pulmonary disease, unspecified: Secondary | ICD-10-CM

## 2018-05-24 DIAGNOSIS — E876 Hypokalemia: Secondary | ICD-10-CM | POA: Diagnosis not present

## 2018-05-24 DIAGNOSIS — E890 Postprocedural hypothyroidism: Secondary | ICD-10-CM | POA: Diagnosis not present

## 2018-05-24 DIAGNOSIS — Z87891 Personal history of nicotine dependence: Secondary | ICD-10-CM

## 2018-05-24 DIAGNOSIS — J441 Chronic obstructive pulmonary disease with (acute) exacerbation: Principal | ICD-10-CM | POA: Diagnosis present

## 2018-05-24 DIAGNOSIS — F419 Anxiety disorder, unspecified: Secondary | ICD-10-CM | POA: Diagnosis present

## 2018-05-24 DIAGNOSIS — I2781 Cor pulmonale (chronic): Secondary | ICD-10-CM | POA: Diagnosis not present

## 2018-05-24 DIAGNOSIS — E039 Hypothyroidism, unspecified: Secondary | ICD-10-CM | POA: Diagnosis not present

## 2018-05-24 DIAGNOSIS — Z825 Family history of asthma and other chronic lower respiratory diseases: Secondary | ICD-10-CM

## 2018-05-24 DIAGNOSIS — J9612 Chronic respiratory failure with hypercapnia: Secondary | ICD-10-CM | POA: Diagnosis not present

## 2018-05-24 DIAGNOSIS — Z79890 Hormone replacement therapy (postmenopausal): Secondary | ICD-10-CM | POA: Diagnosis not present

## 2018-05-24 DIAGNOSIS — Z7189 Other specified counseling: Secondary | ICD-10-CM | POA: Diagnosis not present

## 2018-05-24 DIAGNOSIS — R062 Wheezing: Secondary | ICD-10-CM | POA: Diagnosis not present

## 2018-05-24 DIAGNOSIS — R404 Transient alteration of awareness: Secondary | ICD-10-CM | POA: Diagnosis not present

## 2018-05-24 LAB — BLOOD GAS, ARTERIAL
Acid-Base Excess: 21.8 mmol/L — ABNORMAL HIGH (ref 0.0–2.0)
Bicarbonate: 49.7 mmol/L — ABNORMAL HIGH (ref 20.0–28.0)
DELIVERY SYSTEMS: POSITIVE
Drawn by: 30136
EXPIRATORY PAP: 6
FIO2: 0.4
INSPIRATORY PAP: 18
Mode: POSITIVE
O2 Saturation: 93.8 %
PATIENT TEMPERATURE: 97.6
pCO2 arterial: 110 mmHg (ref 32.0–48.0)
pH, Arterial: 7.272 — ABNORMAL LOW (ref 7.350–7.450)
pO2, Arterial: 70.9 mmHg — ABNORMAL LOW (ref 83.0–108.0)

## 2018-05-24 LAB — COMPREHENSIVE METABOLIC PANEL
ALT: 10 U/L (ref 0–44)
AST: 17 U/L (ref 15–41)
Albumin: 2.9 g/dL — ABNORMAL LOW (ref 3.5–5.0)
Alkaline Phosphatase: 36 U/L — ABNORMAL LOW (ref 38–126)
Anion gap: 7 (ref 5–15)
BILIRUBIN TOTAL: 0.8 mg/dL (ref 0.3–1.2)
BUN: 5 mg/dL — AB (ref 8–23)
CO2: 36 mmol/L — ABNORMAL HIGH (ref 22–32)
Calcium: 6.5 mg/dL — ABNORMAL LOW (ref 8.9–10.3)
Chloride: 100 mmol/L (ref 98–111)
Creatinine, Ser: 0.45 mg/dL (ref 0.44–1.00)
Glucose, Bld: 138 mg/dL — ABNORMAL HIGH (ref 70–99)
POTASSIUM: 2.7 mmol/L — AB (ref 3.5–5.1)
Sodium: 143 mmol/L (ref 135–145)
TOTAL PROTEIN: 5 g/dL — AB (ref 6.5–8.1)

## 2018-05-24 LAB — GLUCOSE, CAPILLARY
GLUCOSE-CAPILLARY: 163 mg/dL — AB (ref 70–99)
Glucose-Capillary: 122 mg/dL — ABNORMAL HIGH (ref 70–99)
Glucose-Capillary: 149 mg/dL — ABNORMAL HIGH (ref 70–99)
Glucose-Capillary: 182 mg/dL — ABNORMAL HIGH (ref 70–99)

## 2018-05-24 LAB — BASIC METABOLIC PANEL
ANION GAP: 10 (ref 5–15)
BUN: 8 mg/dL (ref 8–23)
CHLORIDE: 83 mmol/L — AB (ref 98–111)
CO2: 45 mmol/L — AB (ref 22–32)
Calcium: 9.1 mg/dL (ref 8.9–10.3)
Creatinine, Ser: 0.69 mg/dL (ref 0.44–1.00)
GFR calc Af Amer: >60 mL/min (ref >60)
GFR calc non Af Amer: >60 mL/min (ref >60)
GLUCOSE: 179 mg/dL — AB (ref 70–99)
POTASSIUM: 4.6 mmol/L (ref 3.5–5.1)
Sodium: 138 mmol/L (ref 135–145)

## 2018-05-24 LAB — I-STAT CHEM 8, ED
BUN: 11 mg/dL (ref 8–23)
CALCIUM ION: 0.97 mmol/L — AB (ref 1.15–1.40)
CHLORIDE: 85 mmol/L — AB (ref 98–111)
Creatinine, Ser: 0.8 mg/dL (ref 0.44–1.00)
GLUCOSE: 169 mg/dL — AB (ref 70–99)
HCT: 46 % (ref 36.0–46.0)
HEMOGLOBIN: 15.6 g/dL — AB (ref 12.0–15.0)
Potassium: 6.4 mmol/L (ref 3.5–5.1)
SODIUM: 135 mmol/L (ref 135–145)
TCO2: 50 mmol/L — AB (ref 22–32)

## 2018-05-24 LAB — CBC WITH DIFFERENTIAL/PLATELET
Abs Immature Granulocytes: 0 10*3/uL (ref 0.0–0.1)
BASOS ABS: 0 10*3/uL (ref 0.0–0.1)
BASOS PCT: 1 %
Eosinophils Absolute: 0 10*3/uL (ref 0.0–0.7)
Eosinophils Relative: 1 %
HCT: 44.9 % (ref 36.0–46.0)
Hemoglobin: 13.1 g/dL (ref 12.0–15.0)
Immature Granulocytes: 1 %
LYMPHS PCT: 13 %
Lymphs Abs: 0.7 10*3/uL (ref 0.7–4.0)
MCH: 30.5 pg (ref 26.0–34.0)
MCHC: 29.2 g/dL — AB (ref 30.0–36.0)
MCV: 104.4 fL — ABNORMAL HIGH (ref 78.0–100.0)
MONO ABS: 0.4 10*3/uL (ref 0.1–1.0)
MONOS PCT: 8 %
NEUTROS ABS: 4.2 10*3/uL (ref 1.7–7.7)
NEUTROS PCT: 76 %
PLATELETS: 112 10*3/uL — AB (ref 150–400)
RBC: 4.3 MIL/uL (ref 3.87–5.11)
RDW: 13.6 % (ref 11.5–15.5)
WBC: 5.4 10*3/uL (ref 4.0–10.5)

## 2018-05-24 LAB — I-STAT TROPONIN, ED: TROPONIN I, POC: 0 ng/mL (ref 0.00–0.08)

## 2018-05-24 LAB — PHOSPHORUS: PHOSPHORUS: 2.4 mg/dL — AB (ref 2.5–4.6)

## 2018-05-24 LAB — BRAIN NATRIURETIC PEPTIDE: B Natriuretic Peptide: 17.9 pg/mL (ref 0.0–100.0)

## 2018-05-24 MED ORDER — SODIUM CHLORIDE 0.9 % IV SOLN: 250.0000 mL | INTRAVENOUS | Status: DC | PRN

## 2018-05-24 MED ORDER — FAMOTIDINE IN NACL 20-0.9 MG/50ML-% IV SOLN
20.0000 mg | Freq: Two times a day (BID) | INTRAVENOUS | Status: DC
  Administered 2018-05-24 (×2): 20 mg via INTRAVENOUS
  Filled 2018-05-24 (×3): qty 50

## 2018-05-24 MED ORDER — ONDANSETRON HCL 4 MG/2ML IJ SOLN
4.0000 mg | Freq: Once | INTRAMUSCULAR | Status: AC
  Administered 2018-05-24: 4 mg via INTRAVENOUS
  Filled 2018-05-24: qty 2

## 2018-05-24 MED ORDER — ALBUTEROL (5 MG/ML) CONTINUOUS INHALATION SOLN
10.0000 mg/h | INHALATION_SOLUTION | RESPIRATORY_TRACT | Status: DC
  Administered 2018-05-24: 10 mg/h via RESPIRATORY_TRACT
  Filled 2018-05-24 (×2): qty 20

## 2018-05-24 MED ORDER — ALBUTEROL SULFATE (2.5 MG/3ML) 0.083% IN NEBU: 2.5000 mg | INHALATION_SOLUTION | RESPIRATORY_TRACT | Status: DC | PRN

## 2018-05-24 MED ORDER — POTASSIUM CHLORIDE 10 MEQ/100ML IV SOLN
10.0000 meq | INTRAVENOUS | Status: DC
  Administered 2018-05-24 (×2): 10 meq via INTRAVENOUS
  Filled 2018-05-24 (×2): qty 100

## 2018-05-24 MED ORDER — IPRATROPIUM-ALBUTEROL 0.5-2.5 (3) MG/3ML IN SOLN
3.0000 mL | RESPIRATORY_TRACT | Status: DC
  Administered 2018-05-24 – 2018-05-28 (×25): 3 mL via RESPIRATORY_TRACT
  Filled 2018-05-24 (×26): qty 3

## 2018-05-24 MED ORDER — METHYLPREDNISOLONE SODIUM SUCC 125 MG IJ SOLR
60.0000 mg | Freq: Two times a day (BID) | INTRAMUSCULAR | Status: DC
  Administered 2018-05-24 – 2018-05-28 (×8): 60 mg via INTRAVENOUS
  Filled 2018-05-24 (×8): qty 2

## 2018-05-24 MED ORDER — DEXTROSE 5 % IV SOLN
30.0000 mmol | Freq: Once | INTRAVENOUS | Status: AC
  Administered 2018-05-24: 30 mmol via INTRAVENOUS
  Filled 2018-05-24: qty 10

## 2018-05-24 MED ORDER — HEPARIN SODIUM (PORCINE) 5000 UNIT/ML IJ SOLN
5000.0000 [IU] | Freq: Three times a day (TID) | INTRAMUSCULAR | Status: DC
  Administered 2018-05-24 – 2018-05-28 (×12): 5000 [IU] via SUBCUTANEOUS
  Filled 2018-05-24 (×12): qty 1

## 2018-05-24 MED ORDER — ALBUTEROL (5 MG/ML) CONTINUOUS INHALATION SOLN
10.0000 mg/h | INHALATION_SOLUTION | RESPIRATORY_TRACT | Status: DC
  Administered 2018-05-24: 10 mg/h via RESPIRATORY_TRACT

## 2018-05-24 MED ORDER — MAGNESIUM SULFATE IN D5W 1-5 GM/100ML-% IV SOLN
1.0000 g | Freq: Once | INTRAVENOUS | Status: AC
  Administered 2018-05-24: 1 g via INTRAVENOUS
  Filled 2018-05-24: qty 100

## 2018-05-24 NOTE — ED Notes (Signed)
Willene Hatchet called requesting information in regards to pt.  [phone]  Requests RN call to give information

## 2018-05-24 NOTE — H&P (Addendum)
PULMONARY / CRITICAL CARE MEDICINE   Name: Jasmine [name] MRN: [id number] DOB: 07-11-1946    ADMISSION DATE:  05/24/2018 CONSULTATION DATE:  05/24/2018  REFERRING MD:  Dr. Ralene Bathe  CHIEF COMPLAINT:  AMS  HISTORY OF PRESENT ILLNESS:   HPI obtained from medical chart review as patient is encephalopathic and unable to provide.    59 year olf female with PMH significant for former smoker, COPD on home oxygen 3-4 L and nightly CPAP for hypercarbia, lung nodules, HTN, polysubstance abuse, and DVT in left leg (30 years ago) who presents from home with altered mental status.  Patient of Dr. Elsworth Soho, last seen in office by Alvira Monday, NP on 7/16 for followup. Patient reports intermittent compliance with her CPAP at night.  Prior to that she was evaluated in the ER on 7/3 for extertional dyspnea, leg swelling, and 8lb weight increase after being off lasix since her admission in 03/2018 for hypercarbic and hypoxic respiratory failure and diastolic heart failure in which she was placed back on lasix daily and discharged home.   Sent to ER after being found lethargic at home.  Treated with EMS with solumedrol, CAT and atrovent.  Placed on BiPAP in ER with additional two CATs, requiring high BiPAP support with ABG of 7.272/110/70.9/49.74.  She remains lethargic but hemodynamically stable and afebrile.  Labs significant for K 2.7.  PCCM called for admit.   While in ER, patient's boyfriend, [name] arrived to check on patient.  He initially stated he was her husband but later clarified he is her ex-husband, but reunited and have lived together for the last 4 years.  He states patient is noncompliant with her CPAP but does take her medications.  Reports she was in her normal state of health yesterday, almost above average cause she went out shopping and to pay bills, otherwise she has not left the house in 2 weeks.  Only complaint was a mild headache yesterday but otherwise no chest pain, SOB, fever, etc.  He reports he  had trouble waking her up this morning and called EMS.     PAST MEDICAL HISTORY :  She  has a past medical history of ALLERGIC RHINITIS (05/01/2007), Anxiety, Arthritis, ASTHMA (05/01/2007), Cancer (HCC), CHF (congestive heart failure) ([place]), Chronic bronchitis ([place]), COPD (chronic obstructive pulmonary disease) ([place]), DEPRESSION (07/21/2008), DVT (deep venous thrombosis) ([place]) (1960s), FATIGUE (10/10/2010), HYPERTENSION (05/01/2007), HYPERTHYROIDISM (11/23/2007), HYPOTHYROIDISM (08/23/2009), LUMBAR RADICULOPATHY, RIGHT (08/25/2008), On home oxygen therapy, OSTEOPOROSIS (05/01/2007), SHOULDER PAIN, LEFT (08/23/2009), and SINUSITIS, CHRONIC (10/10/2010).  PAST SURGICAL HISTORY: She  has a past surgical history that includes Appendectomy; Ectopic pregnancy surgery ("late '60s or early '70s); Oophorectomy (Right); electrocardiogram (06/20/2006); and Dilation and curettage of uterus (multiple).  Allergies  Allergen Reactions  . Alendronate Sodium Other (See Comments)    Patient does not remember this reaction    No current facility-administered medications on file prior to encounter.    Current Outpatient Medications on File Prior to Encounter  Medication Sig  . albuterol (PROAIR HFA) 108 (90 Base) MCG/ACT inhaler Inhale 1-2 puffs into the lungs every 6 (six) hours as needed for wheezing or shortness of breath.  [name] aspirin EC 81 MG EC tablet Take 1 tablet (81 mg total) by mouth daily.  . citalopram (CELEXA) 20 MG tablet TAKE 1 TABLET BY MOUTH DAILY (Patient taking differently: Take 20 mg by mouth once a day)  . feeding supplement, ENSURE ENLIVE, (ENSURE ENLIVE) LIQD Take 237 mLs by mouth 3 (three) times daily between meals.  [name]  fluticasone furoate-vilanterol (BREO ELLIPTA) 200-25 MCG/INH AEPB Inhale 1 puff into the lungs daily.  . furosemide (LASIX) 40 MG tablet Take 1 tablet (40 mg total) by mouth daily as needed for edema.  . hydroxypropyl methylcellulose / hypromellose (ISOPTO TEARS / GONIOVISC) 2.5 %  ophthalmic solution Place 1 drop into both eyes as needed for dry eyes.  [name] ipratropium-albuterol (DUONEB) 0.5-2.5 (3) MG/3ML SOLN Take 3 mLs by nebulization 2 (two) times daily.  [name] levothyroxine (SYNTHROID, LEVOTHROID) 75 MCG tablet TAKE 1 TABLET BY MOUTH DAILY BEFORE BREAKFAST.  [name] OXYGEN Inhale 4 L into the lungs continuous.   . traMADol (ULTRAM) 50 MG tablet Take 1 tablet (50 mg total) by mouth 2 (two) times daily as needed.    FAMILY HISTORY:  Her family history includes Alcohol abuse in her brother; Asthma in her unknown relative; Diabetes in her brother; Hypertension in her other; Lung cancer in her father; Stroke in her mother; Thyroid disease in her mother.  SOCIAL HISTORY: She  reports that she quit smoking about 2 years ago. Her smoking use included cigarettes. She has a 54.00 pack-year smoking history. She has never used smokeless tobacco. She reports that she drinks alcohol. She reports that she has current or past drug history. Drug: "Crack" cocaine. Frequency: 2.00 times per week.  REVIEW OF SYSTEMS:   Unable to provide as patient is encephalopathic  SUBJECTIVE:    VITAL SIGNS: BP 118/65   Pulse 76   Temp 97.6 F (36.4 C) (Rectal)   Resp 13   Ht 5' (1.524 m)   Wt 111 lb (50.3 kg)   SpO2 92%   BMI 21.68 kg/m   HEMODYNAMICS:    VENTILATOR SETTINGS: FiO2 (%):  [30 %-40 %] 40 %  INTAKE / OUTPUT: No intake/output data recorded.  PHYSICAL EXAMINATION: General:  Older female sitting upright on ER stretcher in NAD on BiPAP HEENT: full face mask, no sign leak, pupils 2/reactive, mild JVD Neuro: Wakes to verbal, follows simple commands in all extremities, confused, maintains conversation then drifts back off to sleep when left alone CV: rrr, no m/r/g PULM: even/non-labored on bipap 18/6, 40% with TV 350-450, significant diminished airflow with faint exp wheeze GI: soft, non-tender, bs active  Extremities: warm/dry, trace tibial edema  Skin: no rashes    LABS:  BMET Recent Labs  Lab 05/24/18 0848 05/24/18 1002  NA 135 143  K 6.4* 2.7*  CL 85* 100  CO2  --  36*  BUN 11 5*  CREATININE 0.80 0.45  GLUCOSE 169* 138*    Electrolytes Recent Labs  Lab 05/24/18 1002  CALCIUM 6.5*    CBC Recent Labs  Lab 05/24/18 0840 05/24/18 0848  WBC 5.4  --   HGB 13.1 15.6*  HCT 44.9 46.0  PLT 112*  --     Coag's No results for input(s): APTT, INR in the last 168 hours.  Sepsis Markers No results for input(s): LATICACIDVEN, PROCALCITON, O2SATVEN in the last 168 hours.  ABG Recent Labs  Lab 05/24/18 1000  PHART 7.272*  PCO2ART 110*  PO2ART 70.9*    Liver Enzymes Recent Labs  Lab 05/24/18 1002  AST 17  ALT 10  ALKPHOS 36*  BILITOT 0.8  ALBUMIN 2.9*    Cardiac Enzymes No results for input(s): TROPONINI, PROBNP in the last 168 hours.  Glucose No results for input(s): GLUCAP in the last 168 hours.  Imaging  STUDIES:  8/5 CXR >>   1. Stable nodularity in the upper lobes compared to  04/21/2018 and chest CT of 12/16/2017. Original recommendations for follow-up from the 12/16/2017 CT scan, which called for a repeat CT in February 2020, are still in effect. 2. Aortic Atherosclerosis and Emphysema.  CULTURES: 8/5 MRSA >>  ANTIBIOTICS: None   SIGNIFICANT EVENTS: 8/5 Admit  LINES/TUBES: PIV   DISCUSSION: 89 yoF with hx of COPD, O2 dependent, noncompliance with CPAP  ASSESSMENT / PLAN:  PULMONARY A: Hypercarbic respiratory failure complicated by AeCOPD, benzo use, and noncompliance with CPAP - no significant hypoxia, on minimal FiO2, less concern for PE - CXR without infiltrate/ significant edema, afebrile, no leukocytosis P:   Admit to ICU as patient remains high intubation risk Continue BiPAP, rate at 10, patient currently breathing over ~12 Wean FiO2 for sats 89-93% to baseline ~3L O2 Solumedrol 60 mg BID duonebs q 4 and albuterol q2 prn - hold home spiriva/ breo Repeat ABG now Would benefit  from PMT consult for longterm management given frequent admissions this year, ongoing noncompliance and polysubstance abuse  CARDIOVASCULAR A:  No acute hypotension or tachycardia despite 3 CATs Hx chronic diastolic HF, polysubstance abuse (last positive for cocaine 12/2017) - wt last admission 03/2018 ~ 118-121 lbs, office 7/16 at 111 lbs P:  Tele monitor Resume lasix 40 mg after replacement for hypokalemia first   RENAL A:   Hypokalemia (in the setting of loop diuretics and s/p 3 CATs) Hypophos P:   S/p 2 runs KCL [place] 30 mmol now Recheck renal panel at 1600 Check UA Trend BMP / mag/ phos/ daily wt/ urinary output Replace electrolytes as indicated Avoid nephrotoxic agents, ensure adequate renal perfusion  GASTROINTESTINAL A:   No acute issues P:   NPO while altered pepcid BID   HEMATOLOGIC A:   Thrombocytopenia - down to 112 from 137 in June 2019 P:  Trend CBC SQ heparin/   INFECTIOUS A:   No known acute processes - CXR neg, afebrile, normal WBC P:   Assess UA Monitor clinically for now Trend WBC/ fever curve  ENDOCRINE A:   No acute issues   Hx hypothyroidism  P:   Monitor CBG q 4 while NPO and steroid use, may need SSI Hold synthroid till mentation improves   NEUROLOGIC A:   Acute encephalopathy related to hypercarbia, rule out other etiologies  Hx of anxiety / Polysubstance/ cocaine abuse - UDS +benzos, previously d/c'd benzo on last admission P:   Frequent neuro checks Assess UA Consider head CT after she complained of headache yesterday, no known trauma or falls, will hold off for now since she is non-focal on exam No sedating medications Hold celexa, ultram   FAMILY  - Updates: [name] [phone]), who initially stated he was her husband but clarified they are not legally married showed up in ER and providing information. States they were formerly married and divorced but now together for last 4 years and they live together.  Next of  kin is her sister, Stanton Kidney.   - Inter-disciplinary family meet or Palliative Care meeting due by:  8/12.   Kennieth Rad, AGACNP-BC [facility] Pgr: [phone] or if no answer [phone] 05/24/2018, 1:09 PM

## 2018-05-24 NOTE — ED Triage Notes (Signed)
Pt brought in by EMS due being altered. Per EMS, husband found pt and she was minimally responsive. Pt will arouse when name is called. Pt received 10mg  of albuterol, 0.5mg  of atrovent, 125mg  of solumedrol.

## 2018-05-24 NOTE — ED Provider Notes (Signed)
[facility] EMERGENCY DEPARTMENT Provider Note   CSN: [id number] Arrival date & time: 05/24/18  0825     History   Chief Complaint Chief Complaint  Patient presents with  . Altered Mental Status    HPI Jasmine [name] is a 73 y.o. female.  The history is provided by the EMS personnel. No language interpreter was used.  Altered Mental Status     Jasmine [name] is a 72 y.o. female who presents to the Emergency Department complaining of AMS. He presents to the emergency department by EMS for evaluation of altered mental status and lethargy. She is been more somnolent over the last few days and has not been using her CPAP at night. EMS was called this morning because she was difficult to arouse by her husband. She was treated with albuterol, Solu-Medrol prior to ED arrival. Level V caveat due to altered mental status. Past Medical History:  Diagnosis Date  . ALLERGIC RHINITIS 05/01/2007  . Anxiety   . Arthritis    "hands" (12/28/2015)  . ASTHMA 05/01/2007   "since I was a child"  . Cancer (HCC)    LUNG  . CHF (congestive heart failure) ([place])   . Chronic bronchitis ([place])   . COPD (chronic obstructive pulmonary disease) ([place])   . DEPRESSION 07/21/2008  . DVT (deep venous thrombosis) (HCC) 1960s   LLE  . FATIGUE 10/10/2010  . HYPERTENSION 05/01/2007  . HYPERTHYROIDISM 11/23/2007   Pt endorses having had Graves disease, possibly radioactive iodine x1, but no thyroidectomy  . HYPOTHYROIDISM 08/23/2009  . LUMBAR RADICULOPATHY, RIGHT 08/25/2008  . On home oxygen therapy    "3-4L; qd; all the time" (12/28/2015)  . OSTEOPOROSIS 05/01/2007  . SHOULDER PAIN, LEFT 08/23/2009  . SINUSITIS, CHRONIC 10/10/2010    Patient Active Problem List   Diagnosis Date Noted  . Chronic low back pain 04/30/2018  . COPD with acute exacerbation ([place])   . Hypothyroidism   . Anxiety   . Acute encephalopathy 03/27/2018  . Respiratory failure with hypercapnia ([place]) 03/24/2018  . Acute on  chronic respiratory failure with hypercapnia ([place]) 03/23/2018  . Hypercarbia 03/22/2018  . Dehydration 03/22/2018  . Polysubstance abuse ([place]) 01/26/2018  . Acute on chronic respiratory failure ([place]) 01/07/2018  . Aspiration into airway 06/03/2017  . Rash 07/12/2016  . Bilateral hearing loss 06/05/2016  . Hyperglycemia 05/21/2016  . Abnormal LFTs 03/24/2016  . Elevated blood sugar 03/24/2016  . Solitary pulmonary nodule 01/09/2016  . Chronic respiratory failure with hypoxia ([place])   . On home oxygen therapy   . Chronic diastolic congestive heart failure ([place])   . Bacteremia Step viridans 12/27/2015  . Tobacco abuse disorder 12/25/2015  . Hypothyroidism following radioiodine therapy 09/26/2014  . Protein-calorie malnutrition, severe ([place]) 06/23/2014  . Cor pulmonale ([place]) 01/12/2013  . COPD (chronic obstructive pulmonary disease) ([place]) 11/29/2011  . Macrocytosis without anemia 11/09/2011  . SINUSITIS, CHRONIC 10/10/2010  . FATIGUE 10/10/2010  . Depression with anxiety 07/21/2008  . Essential hypertension 05/01/2007  . ALLERGIC RHINITIS 05/01/2007  . ASTHMA 05/01/2007  . OSTEOPOROSIS 05/01/2007    Past Surgical History:  Procedure Laterality Date  . APPENDECTOMY    . DILATION AND CURETTAGE OF UTERUS  multiple   history of multiple dialations and curettages and miscarriages, unfortunately never carrying a child to term  . ECTOPIC PREGNANCY SURGERY  "late '60s or early '70s  . ELECTROCARDIOGRAM  06/20/2006  . OOPHORECTOMY Right      OB History  Gravida  5   Para      Term      Preterm      AB  5   Living        SAB  4   TAB      Ectopic  1   Multiple      Live Births               Home Medications    Prior to Admission medications   Medication Sig Start Date End Date Taking? Authorizing Provider  albuterol (PROAIR HFA) 108 (90 Base) MCG/ACT inhaler Inhale 1-2 puffs into the lungs every 6 (six) hours as needed for wheezing or shortness of breath.  12/17/17  Yes Rigoberto Noel, MD  albuterol (VENTOLIN HFA) 108 (90 Base) MCG/ACT inhaler Inhale 2 puffs into the lungs every 6 (six) hours as needed for wheezing or shortness of breath.   Yes [provider]  ALPRAZolam (XANAX) 0.25 MG tablet Take 0.25 mg by mouth 2 (two) times daily.   Yes [provider]  aspirin EC 81 MG EC tablet Take 1 tablet (81 mg total) by mouth daily. 03/27/16  Yes Velvet Bathe, MD  furosemide (LASIX) 40 MG tablet Take 1 tablet (40 mg total) by mouth daily as needed for edema. 04/30/18  Yes Biagio Borg, MD  hydroxypropyl methylcellulose / hypromellose (ISOPTO TEARS / GONIOVISC) 2.5 % ophthalmic solution Place 1 drop into both eyes as needed for dry eyes. 04/30/18  Yes Biagio Borg, MD  levothyroxine (SYNTHROID, LEVOTHROID) 75 MCG tablet TAKE 1 TABLET BY MOUTH DAILY BEFORE BREAKFAST. 03/19/18  Yes Biagio Borg, MD  OXYGEN Inhale 4 L into the lungs continuous.    Yes [provider]  PRESCRIPTION MEDICATION Cpap   Yes [provider]  traMADol (ULTRAM) 50 MG tablet Take 1 tablet (50 mg total) by mouth 2 (two) times daily as needed. 04/30/18  Yes Biagio Borg, MD  citalopram (CELEXA) 20 MG tablet TAKE 1 TABLET BY MOUTH DAILY Patient not taking: Reported on 05/24/2018 10/21/17   Biagio Borg, MD  feeding supplement, ENSURE ENLIVE, (ENSURE ENLIVE) LIQD Take 237 mLs by mouth 3 (three) times daily between meals. Patient not taking: Reported on 05/24/2018 12/31/15   Florencia Reasons, MD  fluticasone furoate-vilanterol (BREO ELLIPTA) 200-25 MCG/INH AEPB Inhale 1 puff into the lungs daily. Patient not taking: Reported on 05/24/2018 12/17/17   Rigoberto Noel, MD  ipratropium-albuterol (DUONEB) 0.5-2.5 (3) MG/3ML SOLN Take 3 mLs by nebulization 2 (two) times daily. Patient not taking: Reported on 05/24/2018 04/30/18   Biagio Borg, MD    Family History Family History  Problem Relation Age of Onset  . Lung cancer Father   . Alcohol abuse Brother   . Diabetes  Brother   . Hypertension Other   . Stroke Mother   . Thyroid disease Mother   . Asthma Unknown        maternal aunts    Social History Social History   Tobacco Use  . Smoking status: Former Smoker    Packs/day: 1.00    Years: 54.00    Pack years: 54.00    Types: Cigarettes    Last attempt to quit: 07/21/2015    Years since quitting: 2.8  . Smokeless tobacco: Never Used  Substance Use Topics  . Alcohol use: Yes    Comment: 1-4 beers per month   . Drug use: Yes    Frequency: 2.0 times per week  Types: "Crack" cocaine    Comment: 12/28/2015 "sometimes weekends and holidays"     Allergies   Alendronate sodium   Review of Systems Review of Systems  Unable to perform ROS: Mental status change     Physical Exam Updated Vital Signs BP 120/70   Pulse 74   Temp 98.7 F (37.1 C) (Axillary)   Resp 18   Ht 5' (1.524 m)   Wt 50.3 kg (111 lb)   SpO2 98%   BMI 21.68 kg/m   Physical Exam  Constitutional: She appears well-developed and well-nourished.  HENT:  Head: Normocephalic and atraumatic.  Cardiovascular: Normal rate and regular rhythm.  No murmur heard. Pulmonary/Chest: She is in respiratory distress.  Decreased air movement bilaterally with and expiratory wheezes  Abdominal: Soft. There is no tenderness. There is no rebound and no guarding.  Musculoskeletal: She exhibits no edema or tenderness.  Neurological:  Lethargic but arousable to verbal stimuli. Weakly moves all four extremities.  Skin: Skin is warm and dry.  Psychiatric:  Unable to assess  Nursing note and vitals reviewed.    ED Treatments / Results  Labs (all labs ordered are listed, but only abnormal results are displayed) Labs Reviewed  CBC WITH DIFFERENTIAL/PLATELET - Abnormal; Notable for the following components:      Result Value   MCV 104.4 (*)    MCHC 29.2 (*)    Platelets 112 (*)    All other components within normal limits  COMPREHENSIVE METABOLIC PANEL - Abnormal; Notable for  the following components:   Potassium 2.7 (*)    CO2 36 (*)    Glucose, Bld 138 (*)    BUN 5 (*)    Calcium 6.5 (*)    Total Protein 5.0 (*)    Albumin 2.9 (*)    Alkaline Phosphatase 36 (*)    All other components within normal limits  BLOOD GAS, ARTERIAL - Abnormal; Notable for the following components:   pH, Arterial 7.272 (*)    pCO2 arterial 110 (*)    pO2, Arterial 70.9 (*)    Bicarbonate 49.7 (*)    Acid-Base Excess 21.8 (*)    All other components within normal limits  BLOOD GAS, ARTERIAL - Abnormal; Notable for the following components:   pH, Arterial 7.271 (*)    pCO2 arterial 109 (*)    pO2, Arterial 68.1 (*)    Bicarbonate 48.5 (*)    Acid-Base Excess 20.8 (*)    All other components within normal limits  PHOSPHORUS - Abnormal; Notable for the following components:   Phosphorus 2.4 (*)    All other components within normal limits  GLUCOSE, CAPILLARY - Abnormal; Notable for the following components:   Glucose-Capillary 182 (*)    All other components within normal limits  GLUCOSE, CAPILLARY - Abnormal; Notable for the following components:   Glucose-Capillary 163 (*)    All other components within normal limits  I-STAT CHEM 8, ED - Abnormal; Notable for the following components:   Potassium 6.4 (*)    Chloride 85 (*)    Glucose, Bld 169 (*)    Calcium, Ion 0.97 (*)    TCO2 50 (*)    Hemoglobin 15.6 (*)    All other components within normal limits  BRAIN NATRIURETIC PEPTIDE  URINALYSIS, ROUTINE W REFLEX MICROSCOPIC  BASIC METABOLIC PANEL  RENAL FUNCTION PANEL  MAGNESIUM  CBC  I-STAT TROPONIN, ED  I-STAT VENOUS BLOOD GAS, ED  I-STAT ARTERIAL BLOOD GAS, ED    EKG EKG  Interpretation  Date/Time:  Monday May 24 2018 08:33:20 EDT Ventricular Rate:  85 PR Interval:    QRS Duration: 91 QT Interval:  344 QTC Calculation: 409 R Axis:   100 Text Interpretation:  Sinus rhythm Right atrial enlargement Right axis deviation Consider left ventricular  hypertrophy Probable lateral infarct, age indeterminate Borderline T abnormalities, lateral leads baseline wander V3-6 Confirmed by Quintella Reichert [phone]) on 05/24/2018 8:37:08 AM Also confirmed by Quintella Reichert [phone]), editor Shon Hale [phone])  on 05/24/2018 12:24:06 PM   Radiology Dg Chest Port 1 View  Result Date: 05/24/2018 CLINICAL DATA:  Shortness of breath.  Altered mental status. EXAM: PORTABLE CHEST 1 VIEW COMPARISON:  04/21/2018 FINDINGS: Atherosclerotic calcification of the aortic arch. Bandlike nodularity in the right upper lobe, stable. The area of the previous left upper lobe nodules is partially obscured by an ECG lead. Heart size within normal limits. Tapering of the peripheral pulmonary vasculature favors emphysema. IMPRESSION: 1. Stable nodularity in the upper lobes compared to 04/21/2018 and chest CT of 12/16/2017. Original recommendations for follow-up from the 12/16/2017 CT scan, which called for a repeat CT in February 2020, are still in effect. 2. Aortic Atherosclerosis (ICD10-I70.0) and Emphysema (ICD10-J43.9). Electronically Signed   By: Van Clines M.D.   On: 05/24/2018 08:59    Procedures Procedures (including critical care time) CRITICAL CARE Performed by: Quintella Reichert   Total critical care time: 40 minutes  Critical care time was exclusive of separately billable procedures and treating other patients.  Critical care was necessary to treat or prevent imminent or life-threatening deterioration.  Critical care was time spent personally by me on the following activities: development of treatment plan with patient and/or surrogate as well as nursing, discussions with consultants, evaluation of patient's response to treatment, examination of patient, obtaining history from patient or surrogate, ordering and performing treatments and interventions, ordering and review of laboratory studies, ordering and review of radiographic studies, pulse oximetry and  re-evaluation of patient's condition.  Medications Ordered in ED Medications  albuterol (PROVENTIL,VENTOLIN) solution continuous neb (0 mg/hr Nebulization Stopped 05/24/18 0951)  0.9 %  sodium chloride infusion (has no administration in time range)  heparin injection 5,000 Units (5,000 Units Subcutaneous Given 05/24/18 1348)  famotidine (PEPCID) IVPB 20 mg premix (20 mg Intravenous New Bag/Given 05/24/18 1530)  ipratropium-albuterol (DUONEB) 0.5-2.5 (3) MG/3ML nebulizer solution 3 mL (3 mLs Nebulization Given 05/24/18 1600)  methylPREDNISolone sodium succinate (SOLU-MEDROL) 125 mg/2 mL injection 60 mg (has no administration in time range)  albuterol (PROVENTIL) (2.5 MG/3ML) 0.083% nebulizer solution 2.5 mg (has no administration in time range)  potassium PHOSPHATE 30 mmol in dextrose 5 % 500 mL infusion (30 mmol Intravenous New Bag/Given 05/24/18 1359)  ondansetron (ZOFRAN) injection 4 mg (4 mg Intravenous Given 05/24/18 0917)  magnesium sulfate IVPB 1 g 100 mL (0 g Intravenous Stopped 05/24/18 1130)     Initial Impression / Assessment and Plan / ED Course  I have reviewed the triage vital signs and the nursing notes.  Pertinent labs & imaging results that were available during my care of the patient were reviewed by me and considered in my medical decision making (see chart for details).     She with history of COPD here from home for evaluation of change in mental status. Patient with respiratory distress on ED arrival, lethargic but irascible with poor air movement. She was placed on BiPAP with continuous nebulizer treatments. She did received Solu-Medrol prior to ED arrival. Her mental status did significantly improve  on BiPAP. Critical care consulted for admission for further treatment of COPD exacerbation with respiratory failure.  Final Clinical Impressions(s) / ED Diagnoses   Final diagnoses:  COPD (chronic obstructive pulmonary disease) ([place])  Acute respiratory failure with hypoxia and  hypercapnia [facility])    ED Discharge Orders    None       Quintella Reichert, MD 05/24/18 1753

## 2018-05-25 ENCOUNTER — Telehealth: Payer: Self-pay | Admitting: Emergency Medicine

## 2018-05-25 ENCOUNTER — Other Ambulatory Visit: Payer: Self-pay | Admitting: *Deleted

## 2018-05-25 ENCOUNTER — Inpatient Hospital Stay (HOSPITAL_COMMUNITY): Payer: Medicare HMO

## 2018-05-25 DIAGNOSIS — R0902 Hypoxemia: Secondary | ICD-10-CM

## 2018-05-25 LAB — GLUCOSE, CAPILLARY
GLUCOSE-CAPILLARY: 125 mg/dL — AB (ref 70–99)
GLUCOSE-CAPILLARY: 111 mg/dL — AB (ref 70–99)
GLUCOSE-CAPILLARY: 185 mg/dL — AB (ref 70–99)
Glucose-Capillary: 102 mg/dL — ABNORMAL HIGH (ref 70–99)
Glucose-Capillary: 149 mg/dL — ABNORMAL HIGH (ref 70–99)

## 2018-05-25 LAB — BLOOD GAS, ARTERIAL
Acid-Base Excess: 20.8 mmol/L — ABNORMAL HIGH (ref 0.0–2.0)
Bicarbonate: 48.5 mmol/L — ABNORMAL HIGH (ref 20.0–28.0)
DRAWN BY: 30136
Delivery systems: POSITIVE
EXPIRATORY PAP: 6
FIO2: 40
Inspiratory PAP: 18
O2 SAT: 92.4 %
PCO2 ART: 109 mmHg — AB (ref 32.0–48.0)
PH ART: 7.271 — AB (ref 7.350–7.450)
PO2 ART: 68.1 mmHg — AB (ref 83.0–108.0)
Patient temperature: 98.6

## 2018-05-25 LAB — RENAL FUNCTION PANEL
ALBUMIN: 3.7 g/dL (ref 3.5–5.0)
ANION GAP: 10 (ref 5–15)
BUN: 8 mg/dL (ref 8–23)
CO2: 45 mmol/L — ABNORMAL HIGH (ref 22–32)
Calcium: 9.3 mg/dL (ref 8.9–10.3)
Chloride: 85 mmol/L — ABNORMAL LOW (ref 98–111)
Creatinine, Ser: 0.66 mg/dL (ref 0.44–1.00)
Glucose, Bld: 138 mg/dL — ABNORMAL HIGH (ref 70–99)
PHOSPHORUS: 3 mg/dL (ref 2.5–4.6)
POTASSIUM: 4.6 mmol/L (ref 3.5–5.1)
Sodium: 140 mmol/L (ref 135–145)

## 2018-05-25 LAB — CBC
HEMATOCRIT: 40.5 % (ref 36.0–46.0)
Hemoglobin: 12 g/dL (ref 12.0–15.0)
MCH: 30.4 pg (ref 26.0–34.0)
MCHC: 29.6 g/dL — ABNORMAL LOW (ref 30.0–36.0)
MCV: 102.5 fL — ABNORMAL HIGH (ref 78.0–100.0)
PLATELETS: 125 10*3/uL — AB (ref 150–400)
RBC: 3.95 MIL/uL (ref 3.87–5.11)
RDW: 13.6 % (ref 11.5–15.5)
WBC: 5 10*3/uL (ref 4.0–10.5)

## 2018-05-25 LAB — POCT I-STAT 3, ART BLOOD GAS (G3+)
Acid-Base Excess: 21 mmol/L — ABNORMAL HIGH (ref 0.0–2.0)
BICARBONATE: 51.1 mmol/L — AB (ref 20.0–28.0)
O2 SAT: 96 %
PCO2 ART: 87.5 mmHg — AB (ref 32.0–48.0)
PH ART: 7.374 (ref 7.350–7.450)
PO2 ART: 88 mmHg (ref 83.0–108.0)
Patient temperature: 98.6

## 2018-05-25 LAB — MAGNESIUM: MAGNESIUM: 1.8 mg/dL (ref 1.7–2.4)

## 2018-05-25 MED ORDER — CHLORHEXIDINE GLUCONATE 0.12 % MT SOLN: 15.0000 mL | Freq: Two times a day (BID) | OROMUCOSAL | Status: DC

## 2018-05-25 MED ORDER — ORAL CARE MOUTH RINSE
15.0000 mL | Freq: Two times a day (BID) | OROMUCOSAL | Status: DC
  Administered 2018-05-25 – 2018-05-27 (×5): 15 mL via OROMUCOSAL

## 2018-05-25 MED ORDER — LEVOTHYROXINE SODIUM 75 MCG PO TABS
75.0000 ug | ORAL_TABLET | Freq: Every day | ORAL | Status: DC
  Administered 2018-05-26 – 2018-05-28 (×3): 75 ug via ORAL
  Filled 2018-05-25 (×4): qty 1

## 2018-05-25 MED ORDER — MAGNESIUM SULFATE 2 GM/50ML IV SOLN
2.0000 g | Freq: Once | INTRAVENOUS | Status: AC
  Administered 2018-05-25: 2 g via INTRAVENOUS
  Filled 2018-05-25: qty 50

## 2018-05-25 MED ORDER — ORAL CARE MOUTH RINSE: 15.0000 mL | Freq: Two times a day (BID) | OROMUCOSAL | Status: DC

## 2018-05-25 NOTE — Progress Notes (Signed)
RT placed patient on BIPAP for the night. Patient is tolerating well at this time. RN is aware. RT will continue to monitor as needed.

## 2018-05-25 NOTE — Evaluation (Signed)
Physical Therapy Evaluation Patient Details Name: Jasmine [name] MRN: [id number] DOB: [birth year] Today's Date: 05/25/2018   History of Present Illness  11 year olf female with PMH significant for former smoker, COPD on home oxygen 3-4 L and nightly CPAP for hypercarbia, lung nodules, HTN, polysubstance abuse, and DVT in left leg (30 years ago) who presents from home with altered mental status, Treated for hypercarbia associated with COPD  Clinical Impression  PTA pt reports independence with ambulation with occasional use of Rollator, pt able to perform iADLS, however ex-husband usually cooks breakfast. Pt with decreased safety awareness today, trying to get out of recliner before lines and leads adjusted for safety. Pt also limited in safe mobility by decreased strength and balance. Pt currently requires minA for transfer and min guard with ambulation of 180 feet with RW. PT recommends HHPT level rehab at d/c to improve strength and balance for safe mobility in her home environment. PT will continue to follow acutely.     Follow Up Recommendations Home health PT;Supervision - Intermittent    Equipment Recommendations  None recommended by PT    Recommendations for Other Services OT consult     Precautions / Restrictions Precautions Precautions: Fall Restrictions Weight Bearing Restrictions: No      Mobility  Bed Mobility Overal bed mobility: Modified Independent             General bed mobility comments: OOB in recliner, increased effort to get LE back into bed  Transfers Overall transfer level: Needs assistance Equipment used: Rolling walker (2 wheeled) Transfers: Sit to/from Stand Sit to Stand: Min guard         General transfer comment: min guard for safety, good power up and steadying   Ambulation/Gait Ambulation/Gait assistance: Min guard Gait Distance (Feet): 180 Feet Assistive device: Rolling walker (2 wheeled) Gait Pattern/deviations: Decreased stride  length;Trunk flexed;Step-through pattern Gait velocity: slowed Gait velocity interpretation: 1.31 - 2.62 ft/sec, indicative of limited community ambulator General Gait Details: min guard for safety, vc for proximity to walker         Balance Overall balance assessment: Needs assistance Sitting-balance support: Feet supported;No upper extremity supported Sitting balance-Leahy Scale: Fair     Standing balance support: During functional activity;Single extremity supported Standing balance-Leahy Scale: Poor Standing balance comment: requires UE support for balance                             Pertinent Vitals/Pain Pain Assessment: No/denies pain    Home Living Family/patient expects to be discharged to:: Private residence Living Arrangements: Spouse/significant other Available Help at Discharge: Family Type of Home: House Home Access: Ramped entrance     Home Layout: One level Home Equipment: Environmental consultant - 4 wheels;Walker - 2 wheels Additional Comments: still gets into bath tub for bathing. independent in dressing bathing.     Prior Function Level of Independence: Independent with assistive device(s)         Comments: on chronic O2, reports 4 L at baseline, although RT working to wean to 2L         Extremity/Trunk Assessment   Upper Extremity Assessment Upper Extremity Assessment: Generalized weakness    Lower Extremity Assessment Lower Extremity Assessment: Generalized weakness       Communication   Communication: No difficulties  Cognition Arousal/Alertness: Awake/alert Behavior During Therapy: Impulsive Overall Cognitive Status: History of cognitive impairments - at baseline  General Comments: jumped up from recliner multiple times before lines and leads adjusted, needed vc for staying seated      General Comments General comments (skin integrity, edema, etc.): pt on 3L O2 via [place], at rest SaO2 98%O2,  with ambulation SaO2 dropped to 79%O2, required standing rest break to recover vc for pursed lipped breathing and increase in supplemental O2 to 4L O2 via [place], with continued ambulation SaO2 dropped to 88%O2. once supine in bed SaO2 rebounded to 100%O2, supplemental O2 decreased to 3L O2 and SaO2 remained >98%O2          Assessment/Plan    PT Assessment Patient needs continued PT services  PT Problem List Decreased activity tolerance;Decreased mobility;Cardiopulmonary status limiting activity;Decreased safety awareness;Decreased strength       PT Treatment Interventions DME instruction;Gait training;Functional mobility training;Therapeutic activities;Therapeutic exercise;Balance training;Cognitive remediation;Patient/family education    PT Goals (Current goals can be found in the Care Plan section)  Acute Rehab PT Goals Patient Stated Goal: go home PT Goal Formulation: With patient Time For Goal Achievement: 06/08/18 Potential to Achieve Goals: Fair    Frequency Min 3X/week    AM-PAC PT "6 Clicks" Daily Activity  Outcome Measure Difficulty turning over in bed (including adjusting bedclothes, sheets and blankets)?: A Little Difficulty moving from lying on back to sitting on the side of the bed? : Unable Difficulty sitting down on and standing up from a chair with arms (e.g., wheelchair, bedside commode, etc,.)?: Unable Help needed moving to and from a bed to chair (including a wheelchair)?: A Little Help needed walking in hospital room?: A Little Help needed climbing 3-5 steps with a railing? : A Little 6 Click Score: 14    End of Session Equipment Utilized During Treatment: Gait belt;Oxygen Activity Tolerance: Patient tolerated treatment well Patient left: in bed;with call bell/phone within reach;with bed alarm set;with family/visitor present Nurse Communication: Mobility status PT Visit Diagnosis: Unsteadiness on feet (R26.81);Other abnormalities of gait and mobility  (R26.89);Muscle weakness (generalized) (M62.81);Difficulty in walking, not elsewhere classified (R26.2)    Time: 8453-6468 PT Time Calculation (min) (ACUTE ONLY): 34 min   Charges:   PT Evaluation $PT Eval Moderate Complexity: 1 Mod PT Treatments $Gait Training: 8-22 mins        Hercules Hasler B. Migdalia Dk PT, DPT Acute Rehabilitation  [phone] Pager [phone]    [place] 05/25/2018, 4:30 PM

## 2018-05-25 NOTE — Progress Notes (Signed)
PULMONARY / CRITICAL CARE MEDICINE   Name: Jasmine [name] MRN: [id number] DOB: 1946-01-11    ADMISSION DATE:  05/24/2018 CONSULTATION DATE:  05/24/2018  REFERRING MD:  Dr. Ralene Bathe  CHIEF COMPLAINT:  AMS  HISTORY OF PRESENT ILLNESS:   HPI obtained from medical chart review as patient is encephalopathic and unable to provide.    74 year olf female with PMH significant for former smoker, COPD on home oxygen 3-4 L and nightly CPAP for hypercarbia, lung nodules, HTN, polysubstance abuse, and DVT in left leg (30 years ago) who presents from home with altered mental status.  Patient of Dr. Elsworth Soho, last seen in office by Alvira Monday, NP on 7/16 for followup. Patient reports intermittent compliance with her CPAP at night.  Prior to that she was evaluated in the ER on 7/3 for extertional dyspnea, leg swelling, and 8lb weight increase after being off lasix since her admission in 03/2018 for hypercarbic and hypoxic respiratory failure and diastolic heart failure in which she was placed back on lasix daily and discharged home.   Sent to ER after being found lethargic at home.  Treated with EMS with solumedrol, CAT and atrovent.  Placed on BiPAP in ER with additional two CATs, requiring high BiPAP support with ABG of 7.272/110/70.9/49.74.  She remains lethargic but hemodynamically stable and afebrile.  Labs significant for K 2.7.  PCCM called for admit.   While in ER, patient's boyfriend, [name] arrived to check on patient.  He initially stated he was her husband but later clarified he is her ex-husband, but reunited and have lived together for the last 4 years.  He states patient is noncompliant with her CPAP but does take her medications.  Reports she was in her normal state of health yesterday, almost above average cause she went out shopping and to pay bills, otherwise she has not left the house in 2 weeks.  Only complaint was a mild headache yesterday but otherwise no chest pain, SOB, fever, etc.  He reports he  had trouble waking her up this morning and called EMS.     SUBJECTIVE:  No events overnight, off BiPAP this AM and tolerating it well  VITAL SIGNS: BP (!) 114/57 (BP Location: Left Arm)   Pulse 73   Temp 98.4 F (36.9 C) (Axillary)   Resp (!) 22   Ht 5' (1.524 m)   Wt 51.1 kg (112 lb 10.5 oz)   SpO2 95%   BMI 22.00 kg/m   HEMODYNAMICS:    VENTILATOR SETTINGS: FiO2 (%):  [30 %-40 %] 32 %  INTAKE / OUTPUT: I/O last 3 completed shifts: In: 430 [I.V.:130; IV Piggyback:300] Out: 1175 [Urine:1175]  PHYSICAL EXAMINATION: General:  Chronically ill appearing, off BiPAP, NAD and on [place] HEENT: [place]/AT, PERRL, EOM-I and MMM Neuro: Awake and interactive, moving all ext to command CV: RRR, Nl S1/S2 and -M/R/G PULM: Decreased BS diffusely GI: Soft, NT, ND and +BS Extremities: -edema and -tenderness Skin: no rashes   LABS:  BMET Recent Labs  Lab 05/24/18 1002 05/24/18 1818 05/25/18 0216  NA 143 138 140  K 2.7* 4.6 4.6  CL 100 83* 85*  CO2 36* 45* 45*  BUN 5* 8 8  CREATININE 0.45 0.69 0.66  GLUCOSE 138* 179* 138*    Electrolytes Recent Labs  Lab 05/24/18 1002 05/24/18 1818 05/25/18 0216  CALCIUM 6.5* 9.1 9.3  MG  --   --  1.8  PHOS 2.4*  --  3.0    CBC Recent  Labs  Lab 05/24/18 0840 05/24/18 0848 05/25/18 0216  WBC 5.4  --  5.0  HGB 13.1 15.6* 12.0  HCT 44.9 46.0 40.5  PLT 112*  --  125*    Coag's No results for input(s): APTT, INR in the last 168 hours.  Sepsis Markers No results for input(s): LATICACIDVEN, PROCALCITON, O2SATVEN in the last 168 hours.  ABG Recent Labs  Lab 05/24/18 1000 05/24/18 1200 05/25/18 0446  PHART 7.272* 7.271* 7.374  PCO2ART 110* 109* 87.5*  PO2ART 70.9* 68.1* 88.0    Liver Enzymes Recent Labs  Lab 05/24/18 1002 05/25/18 0216  AST 17  --   ALT 10  --   ALKPHOS 36*  --   BILITOT 0.8  --   ALBUMIN 2.9* 3.7    Cardiac Enzymes No results for input(s): TROPONINI, PROBNP in the last 168  hours.  Glucose Recent Labs  Lab 05/24/18 1311 05/24/18 1517 05/24/18 1927 05/24/18 2333 05/25/18 0327 05/25/18 0722  GLUCAP 182* 163* 149* 122* 125* 102*    Imaging  STUDIES:  8/5 CXR >>   1. Stable nodularity in the upper lobes compared to 04/21/2018 and chest CT of 12/16/2017. Original recommendations for follow-up from the 12/16/2017 CT scan, which called for a repeat CT in February 2020, are still in effect. 2. Aortic Atherosclerosis and Emphysema.  CULTURES: 8/5 MRSA >>  ANTIBIOTICS: None   SIGNIFICANT EVENTS: 8/5 Admit  LINES/TUBES: PIV   I reviewed CXR myself, hyperinflation noted  DISCUSSION: 23 yoF with hx of COPD, O2 dependent, noncompliance with CPAP  ASSESSMENT / PLAN:  PULMONARY A: Hypercarbic respiratory failure complicated by AeCOPD, benzo use, and noncompliance with CPAP - no significant hypoxia, on minimal FiO2, less concern for PE - CXR without infiltrate/ significant edema, afebrile, no leukocytosis P:   Transfer to SDU Change BiPAP to home CPAP Titrate O2 for sat of 88-92% Solumedrol 60 mg BID and complete 72 hours then change to PO and taper Duonebs q 4 and albuterol q2 prn Restart home home spiriva/ breo upon discharge D/C further ABGs Would benefit from PMT consult for longterm management given frequent admissions this year, ongoing noncompliance and polysubstance abuse  CARDIOVASCULAR A:  No acute hypotension or tachycardia despite 3 CATs Hx chronic diastolic HF, polysubstance abuse (last positive for cocaine 12/2017) - wt last admission 03/2018 ~ 118-121 lbs, office 7/16 at 111 lbs P:  Tele monitor Hold further lasix at this time and reassess on a day by day bases  RENAL A:   Hypokalemia (in the setting of loop diuretics and s/p 3 CATs) Hypophos P:   Replace electrolytes as indicated (Mg today) Hold lasix BMET in AM Avoid nephrotoxic agents, ensure adequate renal perfusion  GASTROINTESTINAL A:   No acute issues P:    Begin heart healthy diet D/C pepcid  HEMATOLOGIC A:   Thrombocytopenia - down to 112 from 137 in June 2019 P:  Trend CBC SQ heparin  INFECTIOUS A:   No known acute processes - CXR neg, afebrile, normal WBC P:   Assess UA Monitor clinically for now Trend WBC/ fever curve  ENDOCRINE A:   No acute issues   Hx hypothyroidism  P:   D/C CBGs and SSI Restart home synthroid  NEUROLOGIC A:   Acute encephalopathy related to hypercarbia, rule out other etiologies  Hx of anxiety / Polysubstance/ cocaine abuse - UDS +benzos, previously d/c'd benzo on last admission P:   Monitor clinically Minimize sedation medications Hold celexa, ultram, can restart in AM if  able to tolerate PO  FAMILY  - Updates: Patient updated bedside  - Inter-disciplinary family meet or Palliative Care meeting due by:  8/12.  Discussed with [facility]-MD, transfer to SDU and to [facility] service with PCCM off 8/7.  Rush Farmer, M.D. [facility] Pulmonary/Critical Care Medicine. Pager: [phone]. After hours pager: [phone].  05/25/2018, 8:23 AM

## 2018-05-25 NOTE — Progress Notes (Signed)
RT found patient on 3L with sat of 99%. RT lowered FIO2 to 2L per sat goal. Vitals are stable. RT will continue to monitor.

## 2018-05-25 NOTE — Progress Notes (Signed)
RT NOTE: Patient alert and oriented this AM. Per 05:00 ABG patient is now compensated and has returned to baseline. Patient removed from bipap and placed on 3L [place]. Vitals are stable and patient is comfortable with no increase in WOB or SOB. RT will continue to monitor.

## 2018-05-25 NOTE — Care Management (Signed)
Per [facility] pt was sent home last time with Trilogy Settings;  PS SVT, PF 6-8 Peep 5-7 Rate 8-10 SVT 200-400 PS Max 20-25 4 liter bleed in of oxygen Elenor Quinones, Therapist, sports, BSN [phone]

## 2018-05-25 NOTE — Telephone Encounter (Signed)
Called patient to schedule AWV. Pt declined at this time. 

## 2018-05-25 NOTE — Progress Notes (Signed)
This note also relates to the following rows which could not be included: SpO2 - Cannot attach notes to unvalidated device data  Titrated FIO2 to 2L per sat goals.

## 2018-05-25 NOTE — Care Management Note (Addendum)
Case Management Note  Patient Details  Name: ALLICE GARRO MRN: [id number] Date of Birth: Feb 18, 1946  Subjective/Objective:  Pt admitted with AMS due to non compliance with home oxygen                   Action/Plan:  PTA from home with significant other Donald.  CM confirmed with [facility] that pt in fact has Trilogy at home - setting supplied on physician sticky tab. Pt informed CM that she has working equipment in the home, portable tank for transport home.  Respiratory therapy gave extensive teaching on importance of using recommended equipment as directed.  Pt acknowledged the need to wear the equipment and stated she would wear it from now on.  Pt is also active with [facility]HRN PT - CM requested resumption orders.  CM requested [facility] to ensure additional teaching provided by [facility] regarding compliance.  [facility] also following   Expected Discharge Date:                  Expected Discharge Plan:  [place]  In-House Referral:     Discharge planning Services  CM Consult  Post Acute Care Choice:    Choice offered to:     DME Arranged:    DME Agency:     HH Arranged:    [place] Agency:     Status of Service:  In process, will continue to follow  If discussed at Long Length of Stay Meetings, dates discussed:    Additional Comments:  Maryclare Labrador, RN 05/25/2018, 4:13 PM

## 2018-05-25 NOTE — Patient Outreach (Signed)
[facility]) Care Management  05/25/2018  SHATIMA ZALAR 1946-04-14 [id number]   CSW noted that patient was hospitalized on Monday, May 24, 2018 due to Altered Mental Status.  Patient continues to reside in the hospital.  CSW will continue to follow patient while hospitalized then resume social work services once patient is discharged back into the community. [name], BSW, MSW, LCSW  Licensed Education officer, environmental [facility]  Mailing [place] [address] [place], [place] [address] Physical Address-[address], [place], [place] [address] Toll Free Main # [phone] Fax # [phone] Cell # [phone]  Office # [phone] [email or web address]

## 2018-05-26 ENCOUNTER — Other Ambulatory Visit: Payer: Self-pay

## 2018-05-26 ENCOUNTER — Telehealth: Payer: Self-pay | Admitting: Pulmonary Disease

## 2018-05-26 DIAGNOSIS — J431 Panlobular emphysema: Secondary | ICD-10-CM

## 2018-05-26 LAB — BASIC METABOLIC PANEL
ANION GAP: 7 (ref 5–15)
BUN: 18 mg/dL (ref 8–23)
CALCIUM: 9.4 mg/dL (ref 8.9–10.3)
CO2: 43 mmol/L — ABNORMAL HIGH (ref 22–32)
Chloride: 85 mmol/L — ABNORMAL LOW (ref 98–111)
Creatinine, Ser: 0.62 mg/dL (ref 0.44–1.00)
GFR calc Af Amer: >60 mL/min (ref >60)
GLUCOSE: 139 mg/dL — AB (ref 70–99)
Potassium: 4.4 mmol/L (ref 3.5–5.1)
SODIUM: 135 mmol/L (ref 135–145)

## 2018-05-26 LAB — CBC
HCT: 34.8 % — ABNORMAL LOW (ref 36.0–46.0)
Hemoglobin: 10.9 g/dL — ABNORMAL LOW (ref 12.0–15.0)
MCH: 30.9 pg (ref 26.0–34.0)
MCHC: 31.3 g/dL (ref 30.0–36.0)
MCV: 98.6 fL (ref 78.0–100.0)
PLATELETS: 145 10*3/uL — AB (ref 150–400)
RBC: 3.53 MIL/uL — AB (ref 3.87–5.11)
RDW: 13.9 % (ref 11.5–15.5)
WBC: 8.1 10*3/uL (ref 4.0–10.5)

## 2018-05-26 LAB — GLUCOSE, CAPILLARY
GLUCOSE-CAPILLARY: 127 mg/dL — AB (ref 70–99)
Glucose-Capillary: 114 mg/dL — ABNORMAL HIGH (ref 70–99)
Glucose-Capillary: 140 mg/dL — ABNORMAL HIGH (ref 70–99)
Glucose-Capillary: 164 mg/dL — ABNORMAL HIGH (ref 70–99)
Glucose-Capillary: 172 mg/dL — ABNORMAL HIGH (ref 70–99)
Glucose-Capillary: 182 mg/dL — ABNORMAL HIGH (ref 70–99)

## 2018-05-26 LAB — PHOSPHORUS: Phosphorus: 3 mg/dL (ref 2.5–4.6)

## 2018-05-26 LAB — MAGNESIUM: MAGNESIUM: 2 mg/dL (ref 1.7–2.4)

## 2018-05-26 NOTE — Progress Notes (Signed)
PROGRESS NOTE    Jasmine [name]  NOB:[id number] DOB: 09/12/1946 DOA: 05/24/2018 PCP: Biagio Borg, MD  Outpatient Specialists:     Brief Narrative:   Jasmine [name] is a 71 y.o. female with medical history significant for COPD with chronic hypoxic/hypercarbic respiratory failure, chronic diastolic CHF, cocaine abuse, hypothyroidism, and anxiety.  Patient was admitted with COPD exacerbation and acute on chronic respiratory failure, combined hypercapnic and hypoxic.  Assessment & Plan:   Active Problems:   Respiratory failure with hypercapnia (HCC)  Acute on chronic hypoxic and hypoxemic respiratory failure: -Continue steroids IV. Continue neb treatment. BiPAP as needed.  COPD with exacerbation:  -Patient is still tight. -Continue above treatment.  Hypokalemia: Resolved.  Diastolic congestive heart failure: Compensated.  DVT prophylaxis: Subcutaneous heparin Code Status: Full Family Communication:  Disposition Plan: Would depend on hospital course   Consultants:   Transferred from the critical care/pulmonary team  Procedures:   None.  Antimicrobials:   None   Subjective: Patient is still short winded.  Patient is not at baseline.  Objective: Vitals:   05/26/18 1419 05/26/18 1508 05/26/18 1616 05/26/18 1956  BP: 131/82  123/64   Pulse: 92  86   Resp: (!) 22  20   Temp: 98.3 F (36.8 C)  98.5 F (36.9 C)   TempSrc: Oral  Oral   SpO2: 93% 95% 96% 96%  Weight:      Height:        Intake/Output Summary (Last 24 hours) at 05/26/2018 2006 Last data filed at 05/26/2018 1800 Gross per 24 hour  Intake -  Output 420 ml  Net -420 ml   Filed Weights   05/24/18 0837 05/25/18 0500 05/26/18 0500  Weight: 50.3 kg (111 lb) 51.1 kg (112 lb 10.5 oz) 51.2 kg (112 lb 14 oz)    Examination:  General exam: Cachectic.  Appears calm and comfortable  Respiratory system: Very diminished air entryl. Cardiovascular system: S1 & S2 heard. Gastrointestinal system:  Abdomen is nondistended, soft and nontender. No organomegaly or masses felt. Normal bowel sounds heard. Central nervous system: Alert and oriented. No focal neurological deficits. Extremities: No leg edema   Data Reviewed: I have personally reviewed following labs and imaging studies  CBC: Recent Labs  Lab 05/24/18 0840 05/24/18 0848 05/25/18 0216 05/26/18 0335  WBC 5.4  --  5.0 8.1  NEUTROABS 4.2  --   --   --   HGB 13.1 15.6* 12.0 10.9*  HCT 44.9 46.0 40.5 34.8*  MCV 104.4*  --  102.5* 98.6  PLT 112*  --  125* 947*   Basic Metabolic Panel: Recent Labs  Lab 05/24/18 0848 05/24/18 1002 05/24/18 1818 05/25/18 0216 05/26/18 0335  NA 135 143 138 140 135  K 6.4* 2.7* 4.6 4.6 4.4  CL 85* 100 83* 85* 85*  CO2  --  36* 45* 45* 43*  GLUCOSE 169* 138* 179* 138* 139*  BUN 11 5* 8 8 18   CREATININE 0.80 0.45 0.69 0.66 0.62  CALCIUM  --  6.5* 9.1 9.3 9.4  MG  --   --   --  1.8 2.0  PHOS  --  2.4*  --  3.0 3.0   GFR: Estimated Creatinine Clearance: 45 mL/min (by C-G formula based on SCr of 0.62 mg/dL). Liver Function Tests: Recent Labs  Lab 05/24/18 1002 05/25/18 0216  AST 17  --   ALT 10  --   ALKPHOS 36*  --   BILITOT 0.8  --  PROT 5.0*  --   ALBUMIN 2.9* 3.7   No results for input(s): LIPASE, AMYLASE in the last 168 hours. No results for input(s): AMMONIA in the last 168 hours. Coagulation Profile: No results for input(s): INR, PROTIME in the last 168 hours. Cardiac Enzymes: No results for input(s): CKTOTAL, CKMB, CKMBINDEX, TROPONINI in the last 168 hours. BNP (last 3 results) No results for input(s): PROBNP in the last 8760 hours. HbA1C: No results for input(s): HGBA1C in the last 72 hours. CBG: Recent Labs  Lab 05/26/18 0410 05/26/18 0716 05/26/18 1119 05/26/18 1640 05/26/18 1931  GLUCAP 140* 114* 182* 164* 127*   Lipid Profile: No results for input(s): CHOL, HDL, LDLCALC, TRIG, CHOLHDL, LDLDIRECT in the last 72 hours. Thyroid Function Tests: No  results for input(s): TSH, T4TOTAL, FREET4, T3FREE, THYROIDAB in the last 72 hours. Anemia Panel: No results for input(s): VITAMINB12, FOLATE, FERRITIN, TIBC, IRON, RETICCTPCT in the last 72 hours. Urine analysis:    Component Value Date/Time   COLORURINE YELLOW 03/24/2018 1925   APPEARANCEUR HAZY (A) 03/24/2018 1925   LABSPEC 1.024 03/24/2018 1925   PHURINE 6.0 03/24/2018 1925   GLUCOSEU NEGATIVE 03/24/2018 1925   GLUCOSEU NEGATIVE 01/11/2015 1723   HGBUR NEGATIVE 03/24/2018 1925   BILIRUBINUR NEGATIVE 03/24/2018 1925   BILIRUBINUR negative 08/11/2011 1358   KETONESUR 20 (A) 03/24/2018 1925   PROTEINUR 30 (A) 03/24/2018 1925   UROBILINOGEN 0.2 01/11/2015 1723   NITRITE NEGATIVE 03/24/2018 1925   LEUKOCYTESUR MODERATE (A) 03/24/2018 1925   Sepsis Labs: @LABRCNTIP (procalcitonin:4,lacticidven:4)  )No results found for this or any previous visit (from the past 240 hour(s)).       Radiology Studies: Dg Chest Port 1 View  Result Date: 05/25/2018 CLINICAL DATA:  Shortness of breath and COPD EXAM: PORTABLE CHEST 1 VIEW COMPARISON:  05/24/2018 FINDINGS: Cardiac shadow is stable. Aortic calcifications are again seen. The lungs are hyperinflated consistent with COPD. Stable changes in the upper lobes are again noted. No focal infiltrate or sizable effusion is seen. No bony abnormality is noted IMPRESSION: Chronic changes stable from previous exams. No acute abnormality is noted. Electronically Signed   By: Inez Catalina M.D.   On: 05/25/2018 07:10        Scheduled Meds: . heparin  5,000 Units Subcutaneous Q8H  . ipratropium-albuterol  3 mL Nebulization Q4H  . levothyroxine  75 mcg Oral QAC breakfast  . mouth rinse  15 mL Mouth Rinse BID  . methylPREDNISolone (SOLU-MEDROL) injection  60 mg Intravenous Q12H   Continuous Infusions: . sodium chloride Stopped (05/25/18 1200)  . albuterol Stopped (05/24/18 0951)     LOS: 2 days    Time spent: 35 minutes.    Dana Allan,  MD  [facility] Pager #: [phone] 7PM-7AM contact night coverage as above

## 2018-05-26 NOTE — Progress Notes (Signed)
Pt placed on BIPAP  16/6 BUR 8 40%- Pt tolerating well. Sats 93% HR 98, RR 20

## 2018-05-26 NOTE — Telephone Encounter (Signed)
Pt is calling back. Pt is still requesting Dr. Elsworth Soho come see her in the hospital. Pt advised that Dr. Elsworth Soho is in clinic today but there is pulmonary doctor's that are in the hospital that can assist with any questions she may have. Pt requesting to know when she is going to be discharged from the hospital. Pt made aware that the Pulmonary doctor would need to make that determination and that hospital nursing staff would be able to explain her hospital plan of care more extensively should she have more questions. Pt agreed to speak with hospital nursing staff about concerns but is still requesting Dr. Elsworth Soho see her in the hospital. Cb is [phone].

## 2018-05-26 NOTE — Progress Notes (Signed)
[facility]  Patient Status: Active (receiving services up to time of hospitalization)  [facility] is providing the following services: RN and PT  If patient discharges after hours, please call [phone].   Jasmine [name] 05/26/2018, 3:05 PM

## 2018-05-26 NOTE — Telephone Encounter (Signed)
Forwarding to [facility] as [employer]

## 2018-05-26 NOTE — Progress Notes (Signed)
Physical Therapy Treatment Patient Details Name: Jasmine [name] MRN: [id number] DOB: [birth year] Today's Date: 05/26/2018    History of Present Illness 73 year olf female with PMH significant for former smoker, COPD on home oxygen 3-4 L and nightly CPAP for hypercarbia, lung nodules, HTN, polysubstance abuse, and DVT in left leg (30 years ago) who presents from home with altered mental status, Treated for hypercarbia associated with COPD    PT Comments    Pt making good progress towards her goals today, however pt continues to be limited in safe mobility by oxygen desaturation with ambulation. Otherwise, pt is mod I for bed mobility, and supervision for transfers and ambulation with RW. D/c plans remain appropriate at this time.     Follow Up Recommendations  Home health PT;Supervision - Intermittent     Equipment Recommendations  None recommended by PT    Recommendations for Other Services OT consult     Precautions / Restrictions Precautions Precautions: Fall Restrictions Weight Bearing Restrictions: No    Mobility  Bed Mobility Overal bed mobility: Modified Independent             General bed mobility comments: use of bed rail to come to EoB  Transfers Overall transfer level: Needs assistance Equipment used: Rolling walker (2 wheeled) Transfers: Sit to/from Stand Sit to Stand: Supervision         General transfer comment: supevision for safety  Ambulation/Gait Ambulation/Gait assistance: Supervision Gait Distance (Feet): 300 Feet Assistive device: Rolling walker (2 wheeled) Gait Pattern/deviations: Step-through pattern Gait velocity: slowed Gait velocity interpretation: >2.62 ft/sec, indicative of community ambulatory General Gait Details: supervision for safety, vc for proximity to RW       Balance Overall balance assessment: Needs assistance Sitting-balance support: Feet supported;No upper extremity supported Sitting balance-Leahy Scale: Good     Standing balance support: During functional activity;Single extremity supported Standing balance-Leahy Scale: Fair                              Cognition Arousal/Alertness: Awake/alert Behavior During Therapy: Impulsive Overall Cognitive Status: History of cognitive impairments - at baseline                                 General Comments: continues to be slightly impulsive in her movement not realizing she is attached to so many cords      Exercises      General Comments General comments (skin integrity, edema, etc.): at rest on 2L O2 via nasal cannula SaO2 96%O2, with ambulation SaO2 dropped to 86%O2, stopped ambulation, vc for pursed lipped breathing, required increase to 4L O2 via [place], continued ambulation and SaO2 dropped again to 84%O2, stopped and increased pursed lipped breathing, required increase to 5L O2 via [place] SaO2 reboudned to 91%O2 and able to maintain with ambulation back to bed, once in bed able to return to 2L O2 and maintain 90%O2      Pertinent Vitals/Pain Pain Assessment: 0-10           PT Goals (current goals can now be found in the care plan section) Acute Rehab PT Goals Patient Stated Goal: go home PT Goal Formulation: With patient Time For Goal Achievement: 06/08/18 Potential to Achieve Goals: Fair Progress towards PT goals: Progressing toward goals    Frequency    Min 3X/week      PT Plan Current plan remains  appropriate       AM-PAC PT "6 Clicks" Daily Activity  Outcome Measure  Difficulty turning over in bed (including adjusting bedclothes, sheets and blankets)?: A Little Difficulty moving from lying on back to sitting on the side of the bed? : Unable Difficulty sitting down on and standing up from a chair with arms (e.g., wheelchair, bedside commode, etc,.)?: Unable Help needed moving to and from a bed to chair (including a wheelchair)?: A Little Help needed walking in hospital room?: A Little Help needed  climbing 3-5 steps with a railing? : A Little 6 Click Score: 14    End of Session Equipment Utilized During Treatment: Gait belt;Oxygen Activity Tolerance: Patient tolerated treatment well Patient left: in bed;with call bell/phone within reach;with bed alarm set;with family/visitor present Nurse Communication: Mobility status PT Visit Diagnosis: Unsteadiness on feet (R26.81);Other abnormalities of gait and mobility (R26.89);Muscle weakness (generalized) (M62.81);Difficulty in walking, not elsewhere classified (R26.2)     Time: 7494-4967 PT Time Calculation (min) (ACUTE ONLY): 24 min  Charges:  $Gait Training: 23-37 mins                     Jasmine [name] B. Migdalia Dk PT, DPT Acute Rehabilitation  [phone] Pager [phone]     [place] 05/26/2018, 6:25 PM

## 2018-05-26 NOTE — Telephone Encounter (Signed)
My partner has already seen her in the hospital. I will try to see her over the next 2 days when I am at [facility] making rounds

## 2018-05-27 ENCOUNTER — Other Ambulatory Visit: Payer: Self-pay | Admitting: *Deleted

## 2018-05-27 ENCOUNTER — Telehealth: Payer: Self-pay | Admitting: Pulmonary Disease

## 2018-05-27 LAB — GLUCOSE, CAPILLARY
GLUCOSE-CAPILLARY: 160 mg/dL — AB (ref 70–99)
Glucose-Capillary: 128 mg/dL — ABNORMAL HIGH (ref 70–99)
Glucose-Capillary: 114 mg/dL — ABNORMAL HIGH (ref 70–99)
Glucose-Capillary: 141 mg/dL — ABNORMAL HIGH (ref 70–99)
Glucose-Capillary: 109 mg/dL — ABNORMAL HIGH (ref 70–99)
Glucose-Capillary: 132 mg/dL — ABNORMAL HIGH (ref 70–99)
Glucose-Capillary: 172 mg/dL — ABNORMAL HIGH (ref 70–99)

## 2018-05-27 NOTE — Care Management Important Message (Signed)
Important Message  Patient Details  Name: Jasmine [name] MRN: [id number] Date of Birth: 06/17/1946   Medicare Important Message Given:  Yes    Orbie Pyo 05/27/2018, 1:37 PM

## 2018-05-27 NOTE — Telephone Encounter (Signed)
lmtcb for pt.  

## 2018-05-27 NOTE — Telephone Encounter (Signed)
Attempted to call pt. I did not receive an answer. I have left a message for pt to return our call.  

## 2018-05-27 NOTE — Patient Outreach (Signed)
[facility]) Care Management  05/27/2018  Jasmine [name] 11-03-1945 [id number]   CSW was scheduled to meet with patient today to perform a routine home visit; however, patient remains hospitalized.  Patient was admitted into the hospital on Monday, May 24, 2018, due to Altered Mental Status.  CSW will continue to follow patient while hospitalized to assess and assist with any possible discharge planning needs.  Patient will return home to live with her boyfriend/ex-husband at time of discharge from the hospital. CSW left a HIPAA compliant message on patient's voicemail, explaining that CSW will be out of the office all next week but that CSW would follow-up with patient the following week to try and reschedule the home visit.  CSW will make arrangements to contact patient on Monday, June 07, 2018.  Eduard Clos, Social Work colleague, also with [facility] [employer], will be covering in CSW's absence. [name], BSW, MSW, LCSW  Licensed Education officer, [facility]  Mailing [place] [address], [place], [place] [address] Physical Address-[address], [place], [place] [address] Toll Free Main # [phone] Fax # [phone] Cell # [phone]  Office # [phone] [email or web address]

## 2018-05-27 NOTE — Telephone Encounter (Signed)
Pt is calling back [phone]

## 2018-05-27 NOTE — Consult Note (Addendum)
   [facility] CM Inpatient Consult   05/27/2018  Jasmine [name] Apr 28, 1946 [id number]   Patient with an extreme risk score for unplanned readmission.  Patient is currently active with [facility] for chronic disease management services with patient's [facility].  Patient has been engaged by a [employer] and CSW.  Our community based plan of care has focused on disease management and community resource support.  Patient will receive a post hospital transition of care follow up from her Primary Care Provider, confirmed as Dr. [name]. Will follow up with  Inpatient Case Manager aware that [facility] following. Met with the patient at the bedside and she states she would like ongoing follow up with [facility] social worker.  Of note, [facility] Care Management services does not replace or interfere with any services that are needed or arranged by inpatient case management or social work.  For additional questions or referrals please contact:   Natividad Brood, RN BSN [facility] Liaison  [phone] business mobile phone Toll free office [phone]

## 2018-05-27 NOTE — Progress Notes (Signed)
PROGRESS NOTE    Jasmine [name]  QVZ:[id number] DOB: [birth year] DOA: 05/24/2018 PCP: Biagio Borg, MD  Outpatient Specialists:     Brief Narrative:   Jasmine [name] is a 72 y.o. female with medical history significant for COPD with chronic hypoxic/hypercarbic respiratory failure, chronic diastolic CHF, cocaine abuse, hypothyroidism, and anxiety.  Patient was admitted with COPD exacerbation and acute on chronic respiratory failure, combined hypercapnic and hypoxic.  Apparently, the patient has not been compliant with home CPAP.  05/27/2017: Patient seen.  Patient is improving.  Patient is still on IV Solu-Medrol.  Will consider transitioning to oral prednisone in the morning if patient continues to improve.  Further management will depend on hospital course.  Assessment & Plan:   Active Problems:   Respiratory failure with hypercapnia (HCC)  Acute on chronic hypoxic and hypoxemic respiratory failure: -Continue steroids IV. Consider transitioning to oral prednisone in the morning if patient continues to improve. Discussed need to comply with home CPAP. Continue neb treatment. BiPAP as needed.  COPD with exacerbation:  -Patient is slowly improving.   -Continue IV Solu-Medrol 60 mg every 12 hourly and nebs DuoNeb every 4 hourly.   -Patient is also on nebs albuterol every 4 hours as needed.  Hypokalemia: Resolved.  Diastolic congestive heart failure: Compensated.  DVT prophylaxis: Subcutaneous heparin Code Status: Full Family Communication:  Disposition Plan: Likely discharge back home.     Consultants:   Transferred from the critical care/pulmonary team  Procedures:   None.  Antimicrobials:   None   Subjective: Patient continues to improve, but not back to baseline.   No fever chills  No other constitutional symptoms reported.    Objective: Vitals:   05/27/18 0730 05/27/18 0750 05/27/18 1133 05/27/18 1139  BP: 126/79   (!) 143/74  Pulse: 64 77  88  Resp:  13 18  15   Temp: 97.7 F (36.5 C)   97.8 F (36.6 C)  TempSrc: Axillary   Axillary  SpO2: 99% 100% 97% 97%  Weight:      Height:        Intake/Output Summary (Last 24 hours) at 05/27/2018 1454 Last data filed at 05/26/2018 2000 Gross per 24 hour  Intake 240 ml  Output 300 ml  Net -60 ml   Filed Weights   05/25/18 0500 05/26/18 0500 05/27/18 0500  Weight: 51.1 kg 51.2 kg 52.4 kg    Examination:  General exam: Appears calm and comfortable  Respiratory system: Diminished air entry globally, but improving.   Cardiovascular system: S1 & S2 heard. Gastrointestinal system: Abdomen is nondistended, soft and nontender. No organomegaly or masses felt. Normal bowel sounds heard. Central nervous system: Alert and oriented. No focal neurological deficits. Extremities: No leg edema   Data Reviewed: I have personally reviewed following labs and imaging studies  CBC: Recent Labs  Lab 05/24/18 0840 05/24/18 0848 05/25/18 0216 05/26/18 0335  WBC 5.4  --  5.0 8.1  NEUTROABS 4.2  --   --   --   HGB 13.1 15.6* 12.0 10.9*  HCT 44.9 46.0 40.5 34.8*  MCV 104.4*  --  102.5* 98.6  PLT 112*  --  125* 329*   Basic Metabolic Panel: Recent Labs  Lab 05/24/18 0848 05/24/18 1002 05/24/18 1818 05/25/18 0216 05/26/18 0335  NA 135 143 138 140 135  K 6.4* 2.7* 4.6 4.6 4.4  CL 85* 100 83* 85* 85*  CO2  --  36* 45* 45* 43*  GLUCOSE 169* 138* 179* 138* 139*  BUN 11 5* 8 8 18   CREATININE 0.80 0.45 0.69 0.66 0.62  CALCIUM  --  6.5* 9.1 9.3 9.4  MG  --   --   --  1.8 2.0  PHOS  --  2.4*  --  3.0 3.0   GFR: Estimated Creatinine Clearance: 45 mL/min (by C-G formula based on SCr of 0.62 mg/dL). Liver Function Tests: Recent Labs  Lab 05/24/18 1002 05/25/18 0216  AST 17  --   ALT 10  --   ALKPHOS 36*  --   BILITOT 0.8  --   PROT 5.0*  --   ALBUMIN 2.9* 3.7   No results for input(s): LIPASE, AMYLASE in the last 168 hours. No results for input(s): AMMONIA in the last 168  hours. Coagulation Profile: No results for input(s): INR, PROTIME in the last 168 hours. Cardiac Enzymes: No results for input(s): CKTOTAL, CKMB, CKMBINDEX, TROPONINI in the last 168 hours. BNP (last 3 results) No results for input(s): PROBNP in the last 8760 hours. HbA1C: No results for input(s): HGBA1C in the last 72 hours. CBG: Recent Labs  Lab 05/26/18 1931 05/27/18 0029 05/27/18 0514 05/27/18 0733 05/27/18 1139  GLUCAP 127* 160* 128* 114* 141*   Lipid Profile: No results for input(s): CHOL, HDL, LDLCALC, TRIG, CHOLHDL, LDLDIRECT in the last 72 hours. Thyroid Function Tests: No results for input(s): TSH, T4TOTAL, FREET4, T3FREE, THYROIDAB in the last 72 hours. Anemia Panel: No results for input(s): VITAMINB12, FOLATE, FERRITIN, TIBC, IRON, RETICCTPCT in the last 72 hours. Urine analysis:    Component Value Date/Time   COLORURINE YELLOW 03/24/2018 1925   APPEARANCEUR HAZY (A) 03/24/2018 1925   LABSPEC 1.024 03/24/2018 1925   PHURINE 6.0 03/24/2018 1925   GLUCOSEU NEGATIVE 03/24/2018 1925   GLUCOSEU NEGATIVE 01/11/2015 1723   HGBUR NEGATIVE 03/24/2018 1925   BILIRUBINUR NEGATIVE 03/24/2018 1925   BILIRUBINUR negative 08/11/2011 1358   KETONESUR 20 (A) 03/24/2018 1925   PROTEINUR 30 (A) 03/24/2018 1925   UROBILINOGEN 0.2 01/11/2015 1723   NITRITE NEGATIVE 03/24/2018 1925   LEUKOCYTESUR MODERATE (A) 03/24/2018 1925   Sepsis Labs: @LABRCNTIP (procalcitonin:4,lacticidven:4)  )No results found for this or any previous visit (from the past 240 hour(s)).       Radiology Studies: No results found.      Scheduled Meds: . heparin  5,000 Units Subcutaneous Q8H  . ipratropium-albuterol  3 mL Nebulization Q4H  . levothyroxine  75 mcg Oral QAC breakfast  . mouth rinse  15 mL Mouth Rinse BID  . methylPREDNISolone (SOLU-MEDROL) injection  60 mg Intravenous Q12H   Continuous Infusions: . sodium chloride Stopped (05/25/18 1200)  . albuterol Stopped (05/24/18 0951)      LOS: 3 days    Time spent: 25 minutes.    Dana Allan, MD  [facility] Pager #: [phone] 7PM-7AM contact night coverage as above

## 2018-05-27 NOTE — Progress Notes (Signed)
Physical Therapy Treatment Patient Details Name: Jasmine [name] MRN: [id number] DOB: Mar 22, 1946 Today's Date: 05/27/2018    History of Present Illness 29 year olf female with PMH significant for former smoker, COPD on home oxygen 3-4 L and nightly CPAP for hypercarbia, lung nodules, HTN, polysubstance abuse, and DVT in left leg (30 years ago) who presents from home with altered mental status, Treated for hypercarbia associated with COPD    PT Comments    Pt is progressing slowly toward PT goals, she is cooperative and motivated however gait distance limited  Today d/t decr O2 sats and incr HR  during activity; pt was on BiPap last night; see below for details; will benefit from continued PT in acute setting;   Follow Up Recommendations  Home health PT;Supervision - Intermittent     Equipment Recommendations  None recommended by PT    Recommendations for Other Services       Precautions / Restrictions Precautions Precautions: Fall Restrictions Weight Bearing Restrictions: No    Mobility  Bed Mobility Overal bed mobility: Modified Independent             General bed mobility comments: use of bed rail to come to EoB  Transfers Overall transfer level: Needs assistance Equipment used: Rolling walker (2 wheeled) Transfers: Sit to/from Stand Sit to Stand: Supervision         General transfer comment: supevision for safety  Ambulation/Gait Ambulation/Gait assistance: Supervision Gait Distance (Feet): 40 Feet (lengthy standing rest after 20' d/t decr sats) Assistive device: Rolling walker (2 wheeled) Gait Pattern/deviations: Step-through pattern Gait velocity: slowed   General Gait Details: supervision for safety, vc for proximity to RW; pt desats today to 79% on 2L O2, recovered to 89% with standing rest x 1.5 minutes and pursed lip breathing, HR 122, tachy;   Stairs             Wheelchair Mobility    Modified Rankin (Stroke Patients Only)        Balance   Sitting-balance support: Feet supported;No upper extremity supported Sitting balance-Leahy Scale: Good       Standing balance-Leahy Scale: Fair Standing balance comment: requires UE support for dynamic activity                            Cognition Arousal/Alertness: Awake/alert Behavior During Therapy: Impulsive Overall Cognitive Status: History of cognitive impairments - at baseline                                 General Comments: continues to be mildly impulsive, wrapping cords around self in an attempt to untangle them      Exercises      General Comments        Pertinent Vitals/Pain Pain Assessment: No/denies pain    Home Living                      Prior Function            PT Goals (current goals can now be found in the care plan section) Acute Rehab PT Goals Patient Stated Goal: go home PT Goal Formulation: With patient Time For Goal Achievement: 06/08/18 Potential to Achieve Goals: Fair Progress towards PT goals: Progressing toward goals    Frequency    Min 3X/week      PT Plan Current plan remains appropriate  Co-evaluation              AM-PAC PT "6 Clicks" Daily Activity  Outcome Measure  Difficulty turning over in bed (including adjusting bedclothes, sheets and blankets)?: A Little Difficulty moving from lying on back to sitting on the side of the bed? : A Little Difficulty sitting down on and standing up from a chair with arms (e.g., wheelchair, bedside commode, etc,.)?: A Little Help needed moving to and from a bed to chair (including a wheelchair)?: A Little Help needed walking in hospital room?: A Little Help needed climbing 3-5 steps with a railing? : A Little 6 Click Score: 18    End of Session Equipment Utilized During Treatment: Gait belt;Oxygen Activity Tolerance: Patient tolerated treatment well Patient left: in bed;with call bell/phone within reach;with bed alarm set    PT Visit Diagnosis: Unsteadiness on feet (R26.81);Other abnormalities of gait and mobility (R26.89);Muscle weakness (generalized) (M62.81);Difficulty in walking, not elsewhere classified (R26.2)     Time: 2518-9842 PT Time Calculation (min) (ACUTE ONLY): 13 min  Charges:  $Gait Training: 8-22 mins                     Kenyon Ana, PT Pager: [phone] 05/27/2018    [facility] 05/27/2018, 4:00 PM

## 2018-05-28 ENCOUNTER — Telehealth: Payer: Self-pay | Admitting: Internal Medicine

## 2018-05-28 ENCOUNTER — Encounter: Payer: Self-pay | Admitting: *Deleted

## 2018-05-28 DIAGNOSIS — Z7189 Other specified counseling: Secondary | ICD-10-CM

## 2018-05-28 DIAGNOSIS — R0602 Shortness of breath: Secondary | ICD-10-CM

## 2018-05-28 DIAGNOSIS — Z515 Encounter for palliative care: Secondary | ICD-10-CM

## 2018-05-28 DIAGNOSIS — J441 Chronic obstructive pulmonary disease with (acute) exacerbation: Principal | ICD-10-CM

## 2018-05-28 DIAGNOSIS — J9601 Acute respiratory failure with hypoxia: Secondary | ICD-10-CM

## 2018-05-28 DIAGNOSIS — J9602 Acute respiratory failure with hypercapnia: Secondary | ICD-10-CM

## 2018-05-28 LAB — GLUCOSE, CAPILLARY
GLUCOSE-CAPILLARY: 95 mg/dL (ref 70–99)
Glucose-Capillary: 133 mg/dL — ABNORMAL HIGH (ref 70–99)
Glucose-Capillary: 135 mg/dL — ABNORMAL HIGH (ref 70–99)

## 2018-05-28 MED ORDER — FLUTICASONE FUROATE-VILANTEROL 200-25 MCG/INH IN AEPB: 1.0000 | INHALATION_SPRAY | Freq: Every day | RESPIRATORY_TRACT | 5 refills | Status: DC

## 2018-05-28 MED ORDER — PREDNISONE 50 MG PO TABS: ORAL_TABLET | ORAL | 0 refills | Status: DC

## 2018-05-28 NOTE — Progress Notes (Addendum)
Seen today at patient's repeated request and calls to the office. I have known Jasmine [name] for a few years  71 year old ex-smoker with a history of cocaine abuse admitted with acute hypercarbic respiratory failure.  She had 5 admissions in the last 6 months and was also followed by the [facility] care management team  Last PFT >FEV1 at 0.47 L( 30%), ratio 39 , No BD response (<200cc)  During one such admission, she was provided with a trilogy machine with a full facemask she has had problems using this and on her office visit we checked compliance which was very poor  Exam today shows saturation of 98% on 2 L, decreased breath sounds bilateral, no rhonchi, S1-S2 normal, no edema or JVD, awake and alert. Impression/plan  Acute on chronic hypercarbic respiratory failure-now appears to be back to baseline. I have emphasized that she has to use a trilogy machine every night at least 6 hours I was able to decrease oxygen to 1 L and saturations stayed between 94 to 95% She can use 1 L of oxygen at rest and 2 L on exertion.  COPD exac - She would qualify for pulmonary rehab as outpatient. Resume Breo on discharge, can discontinue IV steroids  Last cocaine use was 12/2017 for urine drug screen, she has quit smoking now for a few years. I emphasized that she has end-stage COPD, would probably not survive an episode of mechanical ventilation.  Will involve palliative care for goals of care discussion.  She is making plans for her estate etc.  Leanna Sato. Elsworth Soho MD [phone]

## 2018-05-28 NOTE — Telephone Encounter (Signed)
Copied from [place] [id number]. Topic: Quick Communication - See Telephone Encounter >> May 28, 2018  2:02 PM Berneta Levins wrote: CRM for notification. See Telephone encounter for: 05/28/18.  Quincy Sheehan from [facility] calling.   Pt is being released from hospital today and the case manager from the hospital called requesting that [facility] follow pt when he went home.  Erline Levine is calling to verify that is ok by physician.  Erline Levine can be reached at [phone]

## 2018-05-28 NOTE — Telephone Encounter (Signed)
Called and spoke with pt regarding questions she had for RA Pt is currently in the hospital at [facility] Pt requesting RA to visit her today if possible. RA please advise.

## 2018-05-28 NOTE — Discharge Summary (Signed)
Physician Discharge Summary  Jasmine [name] MWN:[id number] DOB: 01/17/1946 DOA: 05/24/2018  PCP: Biagio Borg, MD  Admit date: 05/24/2018 Discharge date: 05/28/2018  Recommendations for Outpatient Follow-up:  Continue prednisone 50 mg daily for 5 days on discharge Follow up with pulmonary per scheduled appointment  Discharge Diagnoses:  Active Problems:   Goals of care, counseling/discussion   Respiratory failure with hypercapnia (HCC)   Acute respiratory failure with hypoxia and hypercapnia (HCC)   Palliative care by specialist   Shortness of breath    Discharge Condition: stable   Diet recommendation: as tolerated   History of present illness:  72 y.o.femalewith medical history significant forCOPD with chronic hypoxic/hypercarbic respiratory failure, chronic diastolic CHF, cocaine abuse, hypothyroidism, and anxiety.  Patient was admitted with COPD exacerbation and acute on chronic respiratory failure, combined hypercapnic and hypoxic.  Apparently, the patient has not been compliant with home CPAP.   Hospital Course:  Acute on chronic hypoxic and hypoxemic respiratory failure / Acute COPD exacerbation - Will go home on oxygen 2 L continuous - Continue prednisone 50 mg daily for 5 days - Follow up with pulmonary per sch appt - Hospice follow up   Signed:  Leisa Lenz, MD  [facility] 05/28/2018, 2:32 PM  Pager #: [phone]  Time spent in minutes: 35 minutes   Consultations:  Pulmonary  Discharge Exam: Vitals:   05/28/18 0857 05/28/18 1249  BP: (!) 121/91 117/76  Pulse: 92 96  Resp: 17 (!) 22  Temp: 98 F (36.7 C) 98 F (36.7 C)  SpO2: 94% 94%   Vitals:   05/28/18 0451 05/28/18 0830 05/28/18 0857 05/28/18 1249  BP:   (!) 121/91 117/76  Pulse:   92 96  Resp:   17 (!) 22  Temp:   98 F (36.7 C) 98 F (36.7 C)  TempSrc:   Oral Oral  SpO2: 96% 100% 94% 94%  Weight:      Height:        General: Pt is alert, follows commands appropriately,  not in acute distress Cardiovascular: Regular rate and rhythm, S1/S2 + Respiratory: Diminished breawth sounds, no wheezing  Abdominal: Soft, non tender, non distended, bowel sounds +, no guarding Extremities: no edema, no cyanosis, pulses palpable bilaterally DP and PT Neuro: Grossly nonfocal  Discharge Instructions  Discharge Instructions    Call MD for:  persistant nausea and vomiting   Complete by:  As directed    Call MD for:  redness, tenderness, or signs of infection (pain, swelling, redness, odor or green/yellow discharge around incision site)   Complete by:  As directed    Call MD for:  severe uncontrolled pain   Complete by:  As directed    Diet - low sodium heart healthy   Complete by:  As directed    Discharge instructions   Complete by:  As directed    Continue prednisone 50 mg daily for 5 days on discharge Follow up with pulmonary per scheduled appointment   Increase activity slowly   Complete by:  As directed      Allergies as of 05/28/2018      Reactions   Alendronate Sodium Other (See Comments)   Patient does not remember this reaction      Medication List    STOP taking these medications   citalopram 20 MG tablet Commonly known as:  CELEXA   OXYGEN     TAKE these medications   ALPRAZolam 0.25 MG tablet Commonly known as:  XANAX Take 0.25  mg by mouth 2 (two) times daily.   aspirin 81 MG EC tablet Take 1 tablet (81 mg total) by mouth daily.   feeding supplement (ENSURE ENLIVE) Liqd Take 237 mLs by mouth 3 (three) times daily between meals.   fluticasone furoate-vilanterol 200-25 MCG/INH Aepb Commonly known as:  BREO ELLIPTA Inhale 1 puff into the lungs daily.   furosemide 40 MG tablet Commonly known as:  LASIX Take 1 tablet (40 mg total) by mouth daily as needed for edema.   hydroxypropyl methylcellulose / hypromellose 2.5 % ophthalmic solution Commonly known as:  ISOPTO TEARS / GONIOVISC Place 1 drop into both eyes as needed for dry eyes.    ipratropium-albuterol 0.5-2.5 (3) MG/3ML Soln Commonly known as:  DUONEB Take 3 mLs by nebulization 2 (two) times daily.   levothyroxine 75 MCG tablet Commonly known as:  SYNTHROID, LEVOTHROID TAKE 1 TABLET BY MOUTH DAILY BEFORE BREAKFAST.   predniSONE 50 MG tablet Commonly known as:  DELTASONE Take Prednisone 50 mg daily for 5 days   PRESCRIPTION MEDICATION Cpap   traMADol 50 MG tablet Commonly known as:  ULTRAM Take 1 tablet (50 mg total) by mouth 2 (two) times daily as needed.   VENTOLIN HFA 108 (90 Base) MCG/ACT inhaler Generic drug:  albuterol Inhale 2 puffs into the lungs every 6 (six) hours as needed for wheezing or shortness of breath. What changed:  Another medication with the same name was removed. Continue taking this medication, and follow the directions you see here.            Durable Medical Equipment  (From admission, onward)         Start     Ordered   05/28/18 1424  For home use only DME oxygen  Once    Question Answer Comment  Mode or (Route) Nasal cannula   Liters per Minute 2   Frequency Continuous (stationary and portable oxygen unit needed)   Oxygen delivery system Gas      05/28/18 1423         Follow-up Information    [facility] Follow [facility].   Specialty:  Home Health Services Why:  HHRN,HHPT Contact information: [place] [address] [phone]        [facility] [place] Follow up.   Why:  pallialtive services Contact information: [place] [phone]           The results of significant diagnostics from this hospitalization (including imaging, microbiology, ancillary and laboratory) are listed below for reference.    Significant Diagnostic Studies: Dg Chest Port 1 View  Result Date: 05/25/2018 CLINICAL DATA:  Shortness of breath and COPD EXAM: PORTABLE CHEST 1 VIEW COMPARISON:  05/24/2018 FINDINGS: Cardiac shadow  is stable. Aortic calcifications are again seen. The lungs are hyperinflated consistent with COPD. Stable changes in the upper lobes are again noted. No focal infiltrate or sizable effusion is seen. No bony abnormality is noted IMPRESSION: Chronic changes stable from previous exams. No acute abnormality is noted. Electronically Signed   By: Inez Catalina M.D.   On: 05/25/2018 07:10   Dg Chest Port 1 View  Result Date: 05/24/2018 CLINICAL DATA:  Shortness of breath.  Altered mental status. EXAM: PORTABLE CHEST 1 VIEW COMPARISON:  04/21/2018 FINDINGS: Atherosclerotic calcification of the aortic arch. Bandlike nodularity in the right upper lobe, stable. The area of the previous left upper lobe nodules is partially obscured by an ECG lead.  Heart size within normal limits. Tapering of the peripheral pulmonary vasculature favors emphysema. IMPRESSION: 1. Stable nodularity in the upper lobes compared to 04/21/2018 and chest CT of 12/16/2017. Original recommendations for follow-up from the 12/16/2017 CT scan, which called for a repeat CT in February 2020, are still in effect. 2. Aortic Atherosclerosis (ICD10-I70.0) and Emphysema (ICD10-J43.9). Electronically Signed   By: Van Clines M.D.   On: 05/24/2018 08:59    Microbiology: No results found for this or any previous visit (from the past 240 hour(s)).   Labs: Basic Metabolic Panel: Recent Labs  Lab 05/24/18 0848 05/24/18 1002 05/24/18 1818 05/25/18 0216 05/26/18 0335  NA 135 143 138 140 135  K 6.4* 2.7* 4.6 4.6 4.4  CL 85* 100 83* 85* 85*  CO2  --  36* 45* 45* 43*  GLUCOSE 169* 138* 179* 138* 139*  BUN 11 5* 8 8 18   CREATININE 0.80 0.45 0.69 0.66 0.62  CALCIUM  --  6.5* 9.1 9.3 9.4  MG  --   --   --  1.8 2.0  PHOS  --  2.4*  --  3.0 3.0   Liver Function Tests: Recent Labs  Lab 05/24/18 1002 05/25/18 0216  AST 17  --   ALT 10  --   ALKPHOS 36*  --   BILITOT 0.8  --   PROT 5.0*  --   ALBUMIN 2.9* 3.7   No results for input(s):  LIPASE, AMYLASE in the last 168 hours. No results for input(s): AMMONIA in the last 168 hours. CBC: Recent Labs  Lab 05/24/18 0840 05/24/18 0848 05/25/18 0216 05/26/18 0335  WBC 5.4  --  5.0 8.1  NEUTROABS 4.2  --   --   --   HGB 13.1 15.6* 12.0 10.9*  HCT 44.9 46.0 40.5 34.8*  MCV 104.4*  --  102.5* 98.6  PLT 112*  --  125* 145*   Cardiac Enzymes: No results for input(s): CKTOTAL, CKMB, CKMBINDEX, TROPONINI in the last 168 hours. BNP: BNP (last 3 results) Recent Labs    03/27/18 2102 04/21/18 1210 05/24/18 0840  BNP 26.4 13.4 17.9    ProBNP (last 3 results) No results for input(s): PROBNP in the last 8760 hours.  CBG: Recent Labs  Lab 05/27/18 2033 05/27/18 2303 05/28/18 0439 05/28/18 0747 05/28/18 1231  GLUCAP 132* 172* 133* 135* 95

## 2018-05-28 NOTE — Progress Notes (Signed)
SATURATION QUALIFICATIONS: (This note is used to comply with regulatory documentation for home oxygen)  Patient Saturations on Room Air at Rest = 92%  Patient Saturations on Room Air while Ambulating = 86%  Patient Saturations on 1.5 Liters of oxygen while Ambulating = 91%  Patient has COPD and has previously been dependent on O2 at home. She had been wearing her O2 at 4L at home prior to hospitalization. She states that she is now aware that she is a CO2 retainer and we have weaned her O2 down to 1.5-2L per [place]. Patient tolerates this well at rest and saturations are between 95-98%. She has been practicing pursed lip breathing due to shortness of breath when eating and conversing. Dyspnea increases with activity.

## 2018-05-28 NOTE — Progress Notes (Addendum)
Patient discharged home in care of spouse. Discharge instructions and prescriptions provided to patient for reference at home. She is happy to be leaving and has no concerns at this time.

## 2018-05-28 NOTE — Care Management Note (Addendum)
Case Management Note  Patient Details  Name: Jasmine [name] MRN: [id number] Date of Birth: 1946/03/27  Subjective/Objective:  From home with boyfriend, presents  with COPD exacerbation and acute on chronic respiratory failure, combined hypercapnic and hypoxic, she has a bipap triology at home and home oxygen with [facility] .   She is active with [facility], HHPT, will resume at discharge.   Patient would like to have palliative services with [facility].  NCM left vm for Maryann with [facility] and [facility].   NCM received call back from [place] with [facility] and states they will make referral for palliative services for patient.          Action/Plan: DC home when ready.  Expected Discharge Date:  05/28/18               Expected Discharge Plan:  [place]  In-House Referral:     Discharge planning Services  CM Consult  Post Acute Care Choice:  Home Health, Resumption of Svcs/PTA Provider Choice offered to:     DME Arranged:    DME Agency:     HH Arranged:  RN, PT, Disease Management [place] Agency:  [place]  Status of Service:  Completed, signed off  If discussed at [place] of Stay Meetings, dates discussed:    Additional Comments:  Zenon Mayo, RN 05/28/2018, 1:24 PM

## 2018-05-28 NOTE — Discharge Instructions (Signed)

## 2018-05-28 NOTE — Consult Note (Signed)
Consultation Note Date: 05/28/2018   Patient Name: Jasmine [name]  DOB: April 12, 1946  MRN: [id number]  Age / Sex: 72 y.o., female  PCP: Jasmine Borg, MD Referring Physician: Robbie Lis, MD  Reason for Consultation: Establishing goals of care and Psychosocial/spiritual support  HPI/Patient Profile: 72 y.o. female  with past medical history of COPD, CPAP for hypercarbia, history of bilateral lung nodules (last CT scan 2017 results looked better; has seen Dr. Earlie [name] in the past), diastolic heart failure, osteoarthritis, asthma, admitted on 05/24/2018 with hypercarbic respiratory failure.  Her CO2 on admission was 109.  She was placed on BiPAP and her mentation improved.  CXR was negative for infiltrate.  She has had multiple hospitalizations in June of this year secondary to worsening respiratory status.  Consult ordered for goals of care.   Clinical Assessment and Goals of Care: Patient seen, chart reviewed.  Patient is now alert and oriented x3, conversant.  She states she is gaining understanding of her illness and the severity of her COPD.  She understands that her COPD is manifested by retaining carbon dioxide.  We talked at length about being compliant with her CPAP/trilogy machine at home.  I also introduced the topic of advanced directives, CODE STATUS.  She states she is beginning to work with her family on these decisions.  She is currently living with her ex-husband.  He at this point, is not in writing her healthcare proxy.  At this point [name] cannot speak for herself, but however her healthcare proxy in the event she were unable to would likely be her sister, Jasmine [name] at [phone]  Patient at this point is able to speak for herself.  She is in the process of drafting a living will     [place]   Patient is a full code by choice at this point Patient given [facility] Cho[facility]  for [facility] booklet as well as a MOST form to use as a template to guide advance care planning decisions Patient would benefit from further ongoing goals of care discussion in the community.  She verbalizes a good relationship with Dr. Elsworth [name], her pulmonologist.  Community palliative care providers can be reached either through [place] at [phone];[facility] palliative medicine division at [phone]  Code Status/Advance Care Planning:  Full code    Symptom Management:   Dyspnea: Continue with targeted pulmonary treatments, oxygen as well as stressed the importance of maintaining compliance with CPAP at home  Palliative Prophylaxis:   Aspiration, Bowel Regimen, Delirium Protocol, Eye Care, Frequent Pain Assessment and Oral Care  Additional Recommendations (Limitations, Scope, Preferences):  Full Scope Treatment  Psycho-social/Spiritual:   Desire for further Chaplaincy support:no  Additional Recommendations: Referral to Community Resources   Prognosis:   Unable to determine  Discharge Planning: Home with Home Health      Primary Diagnoses: Present on Admission: . Respiratory failure with hypercapnia ([place])   I have reviewed the medical record, interviewed the patient and family, and examined  the patient. The following aspects are pertinent.  Past Medical History:  Diagnosis Date  . ALLERGIC RHINITIS 05/01/2007  . Anxiety   . Arthritis    "hands" (12/28/2015)  . ASTHMA 05/01/2007   "since I was a child"  . Cancer (HCC)    LUNG  . CHF (congestive heart failure) ([place])   . Chronic bronchitis ([place])   . COPD (chronic obstructive pulmonary disease) ([place])   . DEPRESSION 07/21/2008  . DVT (deep venous thrombosis) (HCC) 1960s   LLE  . FATIGUE 10/10/2010  . HYPERTENSION 05/01/2007  . HYPERTHYROIDISM 11/23/2007   Pt endorses having had Graves disease, possibly radioactive iodine x1, but no thyroidectomy  . HYPOTHYROIDISM  08/23/2009  . LUMBAR RADICULOPATHY, RIGHT 08/25/2008  . On home oxygen therapy    "3-4L; qd; all the time" (12/28/2015)  . OSTEOPOROSIS 05/01/2007  . SHOULDER PAIN, LEFT 08/23/2009  . SINUSITIS, CHRONIC 10/10/2010   Social History   Socioeconomic History  . Marital status: Married    Spouse name: Not on file  . Number of children: Not on file  . Years of education: Not on file  . Highest education level: Not on file  Occupational History  . Occupation: RETIRED - Facilities manager: [employer]  . Financial resource strain: Not on file  . Food insecurity:    Worry: Not on file    Inability: Not on file  . Transportation needs:    Medical: Not on file    Non-medical: Not on file  Tobacco Use  . Smoking status: Former Smoker    Packs/day: 1.00    Years: 54.00    Pack years: 54.00    Types: Cigarettes    Last attempt to quit: 07/21/2015    Years since quitting: 2.8  . Smokeless tobacco: Never Used  Substance and Sexual Activity  . Alcohol use: Yes    Comment: 1-4 beers per month   . Drug use: Yes    Frequency: 2.0 times per week    Types: "Crack" cocaine    Comment: 12/28/2015 "sometimes weekends and holidays"  . Sexual activity: Yes    Partners: Male    Birth control/protection: Post-menopausal  Lifestyle  . Physical activity:    Days per week: Not on file    Minutes per session: Not on file  . Stress: Not on file  Relationships  . Social connections:    Talks on phone: Not on file    Gets together: Not on file    Attends religious service: Not on file    Active member of club or organization: Not on file    Attends meetings of clubs or organizations: Not on file    Relationship status: Not on file  Other Topics Concern  . Not on file  Social History Narrative   Lives in [place], [place]. Lives with husband, no children, but husband has 3 kids. Ambulatory, without cane or walker. Smokes cigarettes, currently 1 ppd, smoked for >20-30  yrs. Drinks beer, ~2/day, occasionally liquor, but ? suspect minimizing. Occasional MJ and endorses crack cocaine occasionally   Family History  Problem Relation Age of Onset  . Lung cancer Father   . Alcohol abuse Brother   . Diabetes Brother   . Hypertension Other   . Stroke Mother   . Thyroid disease Mother   . Asthma Unknown        maternal aunts   Scheduled Meds: . heparin  5,000 Units Subcutaneous Q8H  .  ipratropium-albuterol  3 mL Nebulization Q4H  . levothyroxine  75 mcg Oral QAC breakfast  . mouth rinse  15 mL Mouth Rinse BID  . methylPREDNISolone (SOLU-MEDROL) injection  60 mg Intravenous Q12H   Continuous Infusions: . sodium chloride Stopped (05/25/18 1200)  . albuterol Stopped (05/24/18 0951)   PRN Meds:.sodium chloride, albuterol Medications Prior to Admission:  Prior to Admission medications   Medication Sig Start Date End Date Taking? Authorizing Provider  albuterol (PROAIR HFA) 108 (90 Base) MCG/ACT inhaler Inhale 1-2 puffs into the lungs every 6 (six) hours as needed for wheezing or shortness of breath. 12/17/17  Yes Rigoberto Noel, MD  albuterol (VENTOLIN HFA) 108 (90 Base) MCG/ACT inhaler Inhale 2 puffs into the lungs every 6 (six) hours as needed for wheezing or shortness of breath.   Yes [provider]  ALPRAZolam (XANAX) 0.25 MG tablet Take 0.25 mg by mouth 2 (two) times daily.   Yes [provider]  aspirin EC 81 MG EC tablet Take 1 tablet (81 mg total) by mouth daily. 03/27/16  Yes Velvet Bathe, MD  furosemide (LASIX) 40 MG tablet Take 1 tablet (40 mg total) by mouth daily as needed for edema. 04/30/18  Yes Jasmine Borg, MD  hydroxypropyl methylcellulose / hypromellose (ISOPTO TEARS / GONIOVISC) 2.5 % ophthalmic solution Place 1 drop into both eyes as needed for dry eyes. 04/30/18  Yes Jasmine Borg, MD  levothyroxine (SYNTHROID, LEVOTHROID) 75 MCG tablet TAKE 1 TABLET BY MOUTH DAILY BEFORE BREAKFAST. 03/19/18  Yes Jasmine Borg, MD  OXYGEN  Inhale 4 L into the lungs continuous.    Yes [provider]  PRESCRIPTION MEDICATION Cpap   Yes [provider]  traMADol (ULTRAM) 50 MG tablet Take 1 tablet (50 mg total) by mouth 2 (two) times daily as needed. 04/30/18  Yes Jasmine Borg, MD  citalopram (CELEXA) 20 MG tablet TAKE 1 TABLET BY MOUTH DAILY Patient not taking: Reported on 05/24/2018 10/21/17   Jasmine Borg, MD  feeding supplement, ENSURE ENLIVE, (ENSURE ENLIVE) LIQD Take 237 mLs by mouth 3 (three) times daily between meals. Patient not taking: Reported on 05/24/2018 12/31/15   Florencia Reasons, MD  fluticasone furoate-vilanterol (BREO ELLIPTA) 200-25 MCG/INH AEPB Inhale 1 puff into the lungs daily. Patient not taking: Reported on 05/24/2018 12/17/17   Rigoberto Noel, MD  ipratropium-albuterol (DUONEB) 0.5-2.5 (3) MG/3ML SOLN Take 3 mLs by nebulization 2 (two) times daily. Patient not taking: Reported on 05/24/2018 04/30/18   Jasmine Borg, MD   Allergies  Allergen Reactions  . Alendronate Sodium Other (See Comments)    Patient does not remember this reaction   Review of Systems  Constitutional: Positive for activity change and fatigue.  HENT: Negative.   Eyes: Negative.   Respiratory: Positive for shortness of breath.   Cardiovascular: Negative.   Gastrointestinal: Negative.   Genitourinary: Negative.   Musculoskeletal: Negative.   Skin: Negative.   Allergic/Immunologic: Negative.   Neurological: Positive for weakness.  Hematological: Negative.   Psychiatric/Behavioral: Negative.     Physical Exam  Constitutional: She is oriented to person, place, and time. She appears well-developed.  Older female seen sitting up in a recliner, wearing oxygen, in no acute distress  HENT:  Head: Normocephalic and atraumatic.  Neck: Normal range of motion.  Pulmonary/Chest:  No increased work of breathing noted at rest or with conversation  Musculoskeletal: Normal range of motion.  Neurological: She is alert and oriented to  person, place, and time.  Skin: Skin is warm and dry.  Psychiatric: She has a normal mood and affect. Her behavior is normal. Judgment and thought content normal.  Nursing note and vitals reviewed.   Vital Signs: BP 117/76   Pulse 96   Temp 98 F (36.7 C) (Oral)   Resp (!) 22   Ht 5' (1.524 m)   Wt 52.4 kg   SpO2 94%   BMI 22.56 kg/m  Pain Scale: 0-10 POSS *See Group Information*: 1-Acceptable,Awake and alert Pain Score: 0-No pain   SpO2: SpO2: 94 % O2 Device:SpO2: 94 % O2 Flow Rate: .O2 Flow Rate (L/min): 1 L/min  IO: Intake/output summary:   Intake/Output Summary (Last 24 hours) at 05/28/2018 1331 Last data filed at 05/28/2018 0900 Gross per 24 hour  Intake 600 ml  Output 450 ml  Net 150 ml    LBM: Last BM Date: 05/27/18 Baseline Weight: Weight: 50.3 kg Most recent weight: Weight: 52.4 kg     Palliative Assessment/Data:   Flowsheet Rows     Most Recent Value  Intake Tab  Referral Department  Hospitalist  Unit at Time of Referral  Med/Surg Unit  Palliative Care Primary Diagnosis  Pulmonary  Date Notified  05/28/18  Reason for referral  Clarify Goals of Care, Psychosocial or Spiritual support, Advance Care Planning  Date of Admission  05/24/18  Date first seen by Palliative Care  05/28/18  # of days Palliative referral response time  0 Day(s)  # of days IP prior to Palliative referral  4  Clinical Assessment  Palliative Performance Scale Score  50%  Pain Max last 24 hours  0  Pain Min Last 24 hours  0  Dyspnea Max Last 24 Hours  6  Dyspnea Min Last 24 hours  2  Nausea Max Last 24 Hours  0  Nausea Min Last 24 Hours  0  Anxiety Max Last 24 Hours  0  Anxiety Min Last 24 Hours  0  Other Max Last 24 Hours  0  Psychosocial & Spiritual Assessment  Palliative Care Outcomes  Patient/Family meeting held?  Yes  Who was at the meeting?  pt      Time In: 1200 Time Out: 1310 Time Total: 70 min Greater than 50%  of this time was spent counseling and coordinating  care related to the above assessment and plan.  Signed by: Dory Horn, NP   Please contact Palliative Medicine Team phone at [phone] for questions and concerns.  For individual provider: See Shea Evans

## 2018-05-28 NOTE — Telephone Encounter (Signed)
done

## 2018-05-30 DIAGNOSIS — J9612 Chronic respiratory failure with hypercapnia: Secondary | ICD-10-CM | POA: Diagnosis not present

## 2018-05-30 DIAGNOSIS — I110 Hypertensive heart disease with heart failure: Secondary | ICD-10-CM | POA: Diagnosis not present

## 2018-05-30 DIAGNOSIS — G9341 Metabolic encephalopathy: Secondary | ICD-10-CM | POA: Diagnosis not present

## 2018-05-30 DIAGNOSIS — F329 Major depressive disorder, single episode, unspecified: Secondary | ICD-10-CM | POA: Diagnosis not present

## 2018-05-30 DIAGNOSIS — J449 Chronic obstructive pulmonary disease, unspecified: Secondary | ICD-10-CM | POA: Diagnosis not present

## 2018-05-30 DIAGNOSIS — I2781 Cor pulmonale (chronic): Secondary | ICD-10-CM | POA: Diagnosis not present

## 2018-05-30 DIAGNOSIS — M810 Age-related osteoporosis without current pathological fracture: Secondary | ICD-10-CM | POA: Diagnosis not present

## 2018-05-30 DIAGNOSIS — F419 Anxiety disorder, unspecified: Secondary | ICD-10-CM | POA: Diagnosis not present

## 2018-05-30 DIAGNOSIS — I5030 Unspecified diastolic (congestive) heart failure: Secondary | ICD-10-CM | POA: Diagnosis not present

## 2018-05-31 ENCOUNTER — Telehealth: Payer: Self-pay | Admitting: Internal Medicine

## 2018-05-31 DIAGNOSIS — I2781 Cor pulmonale (chronic): Secondary | ICD-10-CM | POA: Diagnosis not present

## 2018-05-31 DIAGNOSIS — J449 Chronic obstructive pulmonary disease, unspecified: Secondary | ICD-10-CM | POA: Diagnosis not present

## 2018-05-31 DIAGNOSIS — I110 Hypertensive heart disease with heart failure: Secondary | ICD-10-CM | POA: Diagnosis not present

## 2018-05-31 DIAGNOSIS — M810 Age-related osteoporosis without current pathological fracture: Secondary | ICD-10-CM | POA: Diagnosis not present

## 2018-05-31 DIAGNOSIS — F419 Anxiety disorder, unspecified: Secondary | ICD-10-CM | POA: Diagnosis not present

## 2018-05-31 DIAGNOSIS — I5030 Unspecified diastolic (congestive) heart failure: Secondary | ICD-10-CM | POA: Diagnosis not present

## 2018-05-31 DIAGNOSIS — J9612 Chronic respiratory failure with hypercapnia: Secondary | ICD-10-CM | POA: Diagnosis not present

## 2018-05-31 DIAGNOSIS — G9341 Metabolic encephalopathy: Secondary | ICD-10-CM | POA: Diagnosis not present

## 2018-05-31 DIAGNOSIS — F329 Major depressive disorder, single episode, unspecified: Secondary | ICD-10-CM | POA: Diagnosis not present

## 2018-05-31 NOTE — Telephone Encounter (Signed)
Copied from [place] [id number]. Topic: Quick Communication - See Telephone Encounter >> May 31, 2018  2:01 PM Berneta Levins wrote: CRM for notification. See Telephone encounter for: 05/31/18.  Rip Harbour with [facility] calling to get verbal orders on pt: Resumption of care orders for skilled nursing 2x week one week and 2 as needed Resumption of care orders for home health aid 2x week one week for personal care  Rip Harbour can be reached at [phone]

## 2018-05-31 NOTE — Telephone Encounter (Signed)
Ok for verbals 

## 2018-05-31 NOTE — Telephone Encounter (Signed)
Called Melinda no answer LMOM w/MD response../lmb 

## 2018-05-31 NOTE — Telephone Encounter (Signed)
Spoke with Erline Levine to give verbal orders per MD to start Palliative Care when pt was released from hospital.

## 2018-06-01 DIAGNOSIS — G9341 Metabolic encephalopathy: Secondary | ICD-10-CM | POA: Diagnosis not present

## 2018-06-01 DIAGNOSIS — F329 Major depressive disorder, single episode, unspecified: Secondary | ICD-10-CM | POA: Diagnosis not present

## 2018-06-01 DIAGNOSIS — I2781 Cor pulmonale (chronic): Secondary | ICD-10-CM | POA: Diagnosis not present

## 2018-06-01 DIAGNOSIS — F419 Anxiety disorder, unspecified: Secondary | ICD-10-CM | POA: Diagnosis not present

## 2018-06-01 DIAGNOSIS — I110 Hypertensive heart disease with heart failure: Secondary | ICD-10-CM | POA: Diagnosis not present

## 2018-06-01 DIAGNOSIS — M810 Age-related osteoporosis without current pathological fracture: Secondary | ICD-10-CM | POA: Diagnosis not present

## 2018-06-01 DIAGNOSIS — I5030 Unspecified diastolic (congestive) heart failure: Secondary | ICD-10-CM | POA: Diagnosis not present

## 2018-06-01 DIAGNOSIS — J9612 Chronic respiratory failure with hypercapnia: Secondary | ICD-10-CM | POA: Diagnosis not present

## 2018-06-01 DIAGNOSIS — J449 Chronic obstructive pulmonary disease, unspecified: Secondary | ICD-10-CM | POA: Diagnosis not present

## 2018-06-02 ENCOUNTER — Telehealth: Payer: Self-pay

## 2018-06-02 ENCOUNTER — Telehealth: Payer: Self-pay | Admitting: Internal Medicine

## 2018-06-02 ENCOUNTER — Other Ambulatory Visit: Payer: Self-pay

## 2018-06-02 DIAGNOSIS — I2781 Cor pulmonale (chronic): Secondary | ICD-10-CM | POA: Diagnosis not present

## 2018-06-02 DIAGNOSIS — J9612 Chronic respiratory failure with hypercapnia: Secondary | ICD-10-CM | POA: Diagnosis not present

## 2018-06-02 DIAGNOSIS — E890 Postprocedural hypothyroidism: Secondary | ICD-10-CM | POA: Diagnosis not present

## 2018-06-02 DIAGNOSIS — J449 Chronic obstructive pulmonary disease, unspecified: Secondary | ICD-10-CM | POA: Diagnosis not present

## 2018-06-02 DIAGNOSIS — R7881 Bacteremia: Secondary | ICD-10-CM | POA: Diagnosis not present

## 2018-06-02 DIAGNOSIS — E43 Unspecified severe protein-calorie malnutrition: Secondary | ICD-10-CM | POA: Diagnosis not present

## 2018-06-02 DIAGNOSIS — J9690 Respiratory failure, unspecified, unspecified whether with hypoxia or hypercapnia: Secondary | ICD-10-CM | POA: Diagnosis not present

## 2018-06-02 DIAGNOSIS — B9561 Methicillin susceptible Staphylococcus aureus infection as the cause of diseases classified elsewhere: Secondary | ICD-10-CM | POA: Diagnosis not present

## 2018-06-02 DIAGNOSIS — F418 Other specified anxiety disorders: Secondary | ICD-10-CM | POA: Diagnosis not present

## 2018-06-02 NOTE — Patient Outreach (Signed)
[name]) Care Management  06/02/2018  Jasmine [name] [birth year] [id number]   Subjective: "I am doing pretty good".  Assessment: 72 year old with history of, but not all inclusive: COPD, Heart failure, depression, HTN, asthma, COPD, cor [place], protein calorie malnutrition, hypothyroidism, hyperglycemia, polysubstance abuse, acute encephalopathy, anxiety.  Most recent admission 8/5-05/28/18 with respiratory failure. Primary care provider listed as completing transition of care call.   RNCM called to follow up. She reports she is doing better. She states she has been out running errands. She is without complaints of shortness of breath. She acknowledges that her oxygen has been decreased 4 to a 2 liters/nasal cannula and states she can tell at times that she is on 2liters versus 4 liters. She denies any problems at this time. RNCM encouraged client to make sure she follows up with pulmonologist scheduled appointment. She states she is wearing her CPAP every night and states that it helps.   She reports [facility] has been in contact with her and states that someone names Inez Catalina called, but she is unclear of where she was calling from. Per chart Inez Catalina from palliative care called to schedule an appointment. RNCM relayed this information to her and encouraged [name] to return her call.  Client is without any questions or concerns at this time.  Plan: home visit next week.  Thea Silversmith, RN, MSN, [place] Coordinator Cell: [phone]

## 2018-06-02 NOTE — Telephone Encounter (Signed)
Ok for verbals 

## 2018-06-02 NOTE — Telephone Encounter (Signed)
Copied from [place] [id number]. Topic: Quick Communication - See Telephone Encounter >> Jun 02, 2018  1:57 PM Rutherford Nail, [place] wrote: CRM for notification. See Telephone encounter for: 06/02/18. Jerilynn calling with [facility] to get verbal orders for physical therapy for the following: 2x a week for 3 weeks 1x a week for 4 weeks CB#: [phone]

## 2018-06-02 NOTE — Telephone Encounter (Signed)
Notified Jasmine [name] w/MD response.Marland KitchenJohny [name]

## 2018-06-02 NOTE — Telephone Encounter (Signed)
Phone call placed to patient to schedule visit with Palliative Care. Patient to return call.

## 2018-06-03 ENCOUNTER — Telehealth: Payer: Self-pay | Admitting: Internal Medicine

## 2018-06-04 NOTE — Telephone Encounter (Addendum)
Colletta [place] calling from [facility] is calling in reference to the patient ALPRAZolam (XANAX) 0.25 MG tablet and the denial she received back . Please call  CB# [phone]

## 2018-06-05 DIAGNOSIS — J9611 Chronic respiratory failure with hypoxia: Secondary | ICD-10-CM | POA: Diagnosis not present

## 2018-06-05 DIAGNOSIS — J449 Chronic obstructive pulmonary disease, unspecified: Secondary | ICD-10-CM | POA: Diagnosis not present

## 2018-06-05 DIAGNOSIS — R911 Solitary pulmonary nodule: Secondary | ICD-10-CM | POA: Diagnosis not present

## 2018-06-05 DIAGNOSIS — R300 Dysuria: Secondary | ICD-10-CM | POA: Diagnosis not present

## 2018-06-07 ENCOUNTER — Other Ambulatory Visit: Payer: Self-pay | Admitting: *Deleted

## 2018-06-07 ENCOUNTER — Telehealth: Payer: Self-pay | Admitting: Internal Medicine

## 2018-06-07 DIAGNOSIS — I5030 Unspecified diastolic (congestive) heart failure: Secondary | ICD-10-CM | POA: Diagnosis not present

## 2018-06-07 DIAGNOSIS — G4733 Obstructive sleep apnea (adult) (pediatric): Secondary | ICD-10-CM | POA: Diagnosis not present

## 2018-06-07 DIAGNOSIS — F329 Major depressive disorder, single episode, unspecified: Secondary | ICD-10-CM | POA: Diagnosis not present

## 2018-06-07 DIAGNOSIS — I110 Hypertensive heart disease with heart failure: Secondary | ICD-10-CM | POA: Diagnosis not present

## 2018-06-07 DIAGNOSIS — M810 Age-related osteoporosis without current pathological fracture: Secondary | ICD-10-CM | POA: Diagnosis not present

## 2018-06-07 DIAGNOSIS — J441 Chronic obstructive pulmonary disease with (acute) exacerbation: Secondary | ICD-10-CM | POA: Diagnosis not present

## 2018-06-07 DIAGNOSIS — F419 Anxiety disorder, unspecified: Secondary | ICD-10-CM | POA: Diagnosis not present

## 2018-06-07 DIAGNOSIS — J9612 Chronic respiratory failure with hypercapnia: Secondary | ICD-10-CM | POA: Diagnosis not present

## 2018-06-07 DIAGNOSIS — I2781 Cor pulmonale (chronic): Secondary | ICD-10-CM | POA: Diagnosis not present

## 2018-06-07 NOTE — Patient Outreach (Signed)
[place]) Care Management  06/07/2018  Jasmine [name] 06-04-1946 [id number]   CSW was able to make contact with patient today to follow-up with her regarding her recent hospitalization, as well as to reschedule the routine home visit that was cancelled with patient and patient's ex-husband, due to patient being hospitalized.  CSW noted that patient was hospitalized from May 24, 2018 until May 28, 2018, due to Altered Mental Status.  Patient was also treated for COPD (Chronic Obstructive Pulmonary Disease) Exacerbation and Acute on Chronic Respiratory Failure, Combined Hypercapnic and Hypoxic.    Patient admits that she had not been wearing her CPAP (Continuous Positive Airway Pressure) Machine at night, which resulted in patient taking in more carbon dioxide than oxygen.  Patient has now been prescribed a BiPAP (Bilevel Positive Airway Pressure) Machine and reports using it "religiously".  Patient further admitted that she feels much better when she wakes up in the morning and is actually able to sleep peacefully throughout the night.  Patient indicated that she is taking her medications exactly as prescribed and following her discharge instructions.  CSW and patient were able to reschedule the routine home visit for Tuesday, June 15, 2018 at 10:00AM.  CSW will meet with patient and patient's ex-husband to provide counseling and supportive services.  Patient admits that her ex-husband has been very supportive of her, as of late, she just wants to ensure that her ex-husband is feeling better about their relationship and that he is more trusting of her.  Patient reported that her ex-husband is still somewhat fearful that she will leave him again for another man, so she is wanting assistance with trying to reassure him.    THN CM Care Plan Problem One     Most Recent Value  Care Plan Problem One  Patient is experiencing signs and symptoms of depression.  Role Documenting the  Problem One  Clinical Social Worker  Care Plan for Problem One  Active  [facility] Long Term Goal   Patient will have a decrease in symptoms of depression through counseling and supportive services, within the next 45 days.  THN Long Term Goal Start Date  04/30/18  [facility] CM Short Term Goal #1   Patient will meet with CSW for an initial home visit to receive counseling and supportive services, within the next week.  THN CM Short Term Goal #1 Start Date  04/30/18  [facility] CM Short Term Goal #1 Met Date  05/05/18  THN CM Short Term Goal #2   Patient will practice deep breathing exercises and relaxation techniques to help cope with symptoms of depression, within the next 30 days.  THN CM Short Term Goal #2 Start Date  04/30/18  [facility] CM Short Term Goal #2 Met Date  05/05/18  [facility] CM Short Term Goal #3  Patient will review list of therapists/counselors provided to her by CSW and decide on an appropriate counselor of choice, within the next 30 days.  THN CM Short Term Goal #3 Start Date  04/30/18  [facility] CM Short Term Goal #3 Met Date  05/19/18  THN CM Short Term Goal #4  Patient and patient's ex-husband will meet with CSW for a routine home visit so that CSW can offer couples counseling and supportive services, within the next week.  THN CM Short Term Goal #4 Start Date  05/19/18  Interventions for Short Term Goal #4  CSW will meet with patient and patient's ex-husband for a routine home visit on Tuesday, June 15, 2018 at 10:00AM.     [name], BSW, MSW, [place]  Licensed Clinical Social Worker  [place] [place]. [address], [place], [place] [address] Physical Address-[address], [place], [place] [address] Toll Free Main # [phone] Fax # [phone] Cell # [phone]  Office # [phone] [email or web address]

## 2018-06-07 NOTE — Telephone Encounter (Signed)
Noted.  Dr. John FYI 

## 2018-06-07 NOTE — Telephone Encounter (Signed)
Copied from [place] [id number]. Topic: Quick Communication - See Telephone Encounter >> Jun 07, 2018  8:35 AM Robina Ade, Helene Kelp D wrote: CRM for notification. See Telephone encounter for: 06/07/18. Peter Congo with [facility] called to let [name] know that pt was discharge from service due to this Friday they tried to contact pt and pt did not answer or return their call and her service end also. If any questions she can be reached at [phone].

## 2018-06-08 ENCOUNTER — Telehealth: Payer: Self-pay | Admitting: Internal Medicine

## 2018-06-08 ENCOUNTER — Telehealth: Payer: Self-pay

## 2018-06-08 NOTE — Telephone Encounter (Signed)
Copied from [place] [id number]. Topic: Quick Communication - See Telephone Encounter >> Jun 08, 2018  1:45 PM Vernona Rieger wrote: CRM for notification. See Telephone encounter for: 06/08/18.  Curt Bears, Occupational therapist with [facility] would like orders to see her for 1 time a week for 4 weeks. Call back # [phone]

## 2018-06-08 NOTE — Telephone Encounter (Signed)
Phone call placed to patient to offer to schedule visit with Palliative Care. Visit scheduled for 06/10/18.

## 2018-06-08 NOTE — Telephone Encounter (Signed)
Verbal orders given via VM 

## 2018-06-09 DIAGNOSIS — J9612 Chronic respiratory failure with hypercapnia: Secondary | ICD-10-CM | POA: Diagnosis not present

## 2018-06-09 DIAGNOSIS — F329 Major depressive disorder, single episode, unspecified: Secondary | ICD-10-CM | POA: Diagnosis not present

## 2018-06-09 DIAGNOSIS — I2781 Cor pulmonale (chronic): Secondary | ICD-10-CM | POA: Diagnosis not present

## 2018-06-09 DIAGNOSIS — G4733 Obstructive sleep apnea (adult) (pediatric): Secondary | ICD-10-CM | POA: Diagnosis not present

## 2018-06-09 DIAGNOSIS — J441 Chronic obstructive pulmonary disease with (acute) exacerbation: Secondary | ICD-10-CM | POA: Diagnosis not present

## 2018-06-09 DIAGNOSIS — I110 Hypertensive heart disease with heart failure: Secondary | ICD-10-CM | POA: Diagnosis not present

## 2018-06-09 DIAGNOSIS — M810 Age-related osteoporosis without current pathological fracture: Secondary | ICD-10-CM | POA: Diagnosis not present

## 2018-06-09 DIAGNOSIS — F419 Anxiety disorder, unspecified: Secondary | ICD-10-CM | POA: Diagnosis not present

## 2018-06-09 DIAGNOSIS — I5030 Unspecified diastolic (congestive) heart failure: Secondary | ICD-10-CM | POA: Diagnosis not present

## 2018-06-10 ENCOUNTER — Other Ambulatory Visit: Payer: BC Managed Care – PPO | Admitting: Internal Medicine

## 2018-06-10 ENCOUNTER — Other Ambulatory Visit: Payer: Self-pay

## 2018-06-10 ENCOUNTER — Telehealth: Payer: Self-pay | Admitting: Pulmonary Disease

## 2018-06-10 DIAGNOSIS — J9611 Chronic respiratory failure with hypoxia: Secondary | ICD-10-CM

## 2018-06-10 DIAGNOSIS — Z515 Encounter for palliative care: Secondary | ICD-10-CM

## 2018-06-10 DIAGNOSIS — R0602 Shortness of breath: Secondary | ICD-10-CM

## 2018-06-10 NOTE — Telephone Encounter (Signed)
Called and spoke to pt. Pt is requesting a Rx for new nebulizer machine, as her current machine is broken.  It does not appear that RA originally ordered machine.   RA please advise if okay to order new machine. Thanks

## 2018-06-10 NOTE — Patient Outreach (Addendum)
[facility]) Care Management   06/10/2018  Jasmine [name] [birth year] [id number]  Jasmine [name] is an 72 y.o. female  Subjective: client reports doing better.   Objective:  Vital signs taken per Palliative care, NP-Per palliative care 110/82 82 oxyen 96 percent resting, 78% with walking in the hall.  Review of Systems  Respiratory:       Lungs sounds decreased. Clear posterior. Faint inspiratory wheeze noted to left anterior.   Cardiovascular:       1+ Edema noted bilaterally, left greater than right.    Physical Exam skin warm, color within normal limits.  Encounter Medications:   Outpatient Encounter Medications as of 06/10/2018  Medication Sig  . albuterol (VENTOLIN HFA) 108 (90 Base) MCG/ACT inhaler Inhale 2 puffs into the lungs every 6 (six) hours as needed for wheezing or shortness of breath.  . ALPRAZolam (XANAX) 0.25 MG tablet Take 0.25 mg by mouth 2 (two) times daily.  [name] aspirin EC 81 MG EC tablet Take 1 tablet (81 mg total) by mouth daily.  . feeding supplement, ENSURE ENLIVE, (ENSURE ENLIVE) LIQD Take 237 mLs by mouth 3 (three) times daily between meals. (Patient not taking: Reported on 05/24/2018)  . fluticasone furoate-vilanterol (BREO ELLIPTA) 200-25 MCG/INH AEPB Inhale 1 puff into the lungs daily.  . furosemide (LASIX) 40 MG tablet Take 1 tablet (40 mg total) by mouth daily as needed for edema.  . hydroxypropyl methylcellulose / hypromellose (ISOPTO TEARS / GONIOVISC) 2.5 % ophthalmic solution Place 1 drop into both eyes as needed for dry eyes.  [name] ipratropium-albuterol (DUONEB) 0.5-2.5 (3) MG/3ML SOLN Take 3 mLs by nebulization 2 (two) times daily. (Patient not taking: Reported on 05/24/2018)  . levothyroxine (SYNTHROID, LEVOTHROID) 75 MCG tablet TAKE 1 TABLET BY MOUTH DAILY BEFORE BREAKFAST.  [name] predniSONE (DELTASONE) 50 MG tablet Take Prednisone 50 mg daily for 5 days  . PRESCRIPTION MEDICATION Cpap  . traMADol (ULTRAM) 50 MG tablet Take 1 tablet (50  mg total) by mouth 2 (two) times daily as needed.   No facility-administered encounter medications on file as of 06/10/2018.     Functional Status:   In your present state of health, do you have any difficulty performing the following activities: 05/26/2018 04/30/2018  Hearing? Y N  Vision? N N  Difficulty concentrating or making decisions? N Y  Comment - -  Walking or climbing stairs? N Y  Dressing or bathing? N Y  Doing errands, shopping? N Y  Conservation officer, nature and eating ? - Y  Using the Toilet? - N  In the past six months, have you accidently leaked urine? - N  Do you have problems with loss of bowel control? - N  Managing your Medications? - Y  Comment - -  Managing your Finances? - N  Housekeeping or managing your Housekeeping? - Y  Some recent data might be hidden    Fall/Depression Screening:    Fall Risk  04/30/2018 04/15/2018 04/14/2018  Falls in the past year? Yes Yes Yes  Number falls in past yr: 1 1 1   Injury with Fall? Yes Yes Yes  Risk Factor Category  High Fall Risk High Fall Risk -  Risk for fall due to : History of fall(s) History of fall(s) -  Follow up Education provided;Falls prevention discussed - -   PHQ 2/9 Scores 04/30/2018 04/28/2018 02/19/2017 01/22/2017 12/23/2016 11/25/2016 10/28/2016  PHQ - 2 Score 2 1 0 0 0 0 0  PHQ- 9 Score 4 - - - - - -  Assessment:  72 year old with history of, but not all inclusive: COPD, Heart failure, depression, HTN, asthma, COPD, cor [place], protein calorie malnutrition, hypothyroidism, hyperglycemia, polysubstance abuse, acute encephalopathy, anxiety. 5 admissions in 6 months; 1 emergency room visit in 6 months. Most recent admission 8/5-05/28/18 with respiratory failure. [facility] remains [facility], including physical therapy.   RNCM completed home visit. Present at home visit palliative care of  present-discussed advacned care planning.  Client does not have nebulizer machine, RNCM called to request  prescription for nebulizer machine.  Per RNCM saturation 94 percent HR 64  Medications reviewed: 1) Taking Furosemide 20 mg daily. 2) Taking Citalopram 20 mg daily-not on profile and instructed to discontinue at discharge. 3) Alprazalam on profile and discharge instructions-client does not have reports she did not get a prescription for this.  Weights daily weight 108.4 pounds.  Pharmacy referral for medication reconciliation  Discussed follow up appointment: Dr. Elsworth Soho 07/08/18 at 3:15  Plan: continue to follow, pharmacy referral for medication reconciliation.  Thea Silversmith, RN, MSN, [place] Coordinator Cell: [phone]

## 2018-06-11 DIAGNOSIS — F329 Major depressive disorder, single episode, unspecified: Secondary | ICD-10-CM | POA: Diagnosis not present

## 2018-06-11 DIAGNOSIS — G4733 Obstructive sleep apnea (adult) (pediatric): Secondary | ICD-10-CM | POA: Diagnosis not present

## 2018-06-11 DIAGNOSIS — I110 Hypertensive heart disease with heart failure: Secondary | ICD-10-CM | POA: Diagnosis not present

## 2018-06-11 DIAGNOSIS — F419 Anxiety disorder, unspecified: Secondary | ICD-10-CM | POA: Diagnosis not present

## 2018-06-11 DIAGNOSIS — J9612 Chronic respiratory failure with hypercapnia: Secondary | ICD-10-CM | POA: Diagnosis not present

## 2018-06-11 DIAGNOSIS — J441 Chronic obstructive pulmonary disease with (acute) exacerbation: Secondary | ICD-10-CM | POA: Diagnosis not present

## 2018-06-11 DIAGNOSIS — I2781 Cor pulmonale (chronic): Secondary | ICD-10-CM | POA: Diagnosis not present

## 2018-06-11 DIAGNOSIS — M810 Age-related osteoporosis without current pathological fracture: Secondary | ICD-10-CM | POA: Diagnosis not present

## 2018-06-11 DIAGNOSIS — I5030 Unspecified diastolic (congestive) heart failure: Secondary | ICD-10-CM | POA: Diagnosis not present

## 2018-06-12 NOTE — Progress Notes (Signed)
PALLIATIVE CARE CONSULT VISIT   PATIENT NAME: Jasmine [name] DOB: [birth year] MRN: [id number]  PRIMARY CARE PROVIDER:   Biagio Borg, MD  REFERRING PROVIDER: Dr. Billey Gosling  RESPONSIBLE PARTY:  Self        RECOMMENDATIONS and PLAN:  1.  Shortness of breath R06.02:  As related to COPD and CHF.  Mild with exertion today.  O2 sats decreased with exertion but resolves with rest and constant use of supplemental oxygen at 2L/min/[place].  Continue nightly use of BiPap as reported. Maintenance meds of Breo, Spiriva and begin Duoneb BID as prescribed.  Decrease use of Ventolin to prn only. No nebulizer present in home for scheduled nebulizer treatments with DuoNeb. This could aid in prevention of recurrent exacerbations.   [facility] nurse Carlene Coria, RN will obtain order for same.  Encouraged work/rest periods to conserve energy.  2.   Palliative care encounter Z51.5:  Explanation of Palliative and Hospice Care services. Reviewed goals of care which are to continue to improve health and management of chronic lung disease.  She also wants to prevent re-hospitalizations.  Committed to continuing abstinence from smoking and use of illicite substances.   MOST form and code status reviewed.  She has selected full code but [place] unsure about intubation.  She would like IVs and antibiotics if indicated and is unsure about tube feedings.  She reports pending appointment with legal representative to complete will and advanced directives.  We will re-address ACP during next visit.  Palliative care will continue to follow patient.  I spent 90 minutes providing this consultation,  from 10:45am to 12:15pm at home. More than 50% of the time in this consultation was spent coordinating communication with patient.  [facility] nurse Carlene Coria, RN arrived prior to completion of palliative appointment.     HISTORY OF PRESENT ILLNESS:  Jasmine [name] is a 72 y.o. year old female with multiple medical problems including CHF,  COPD with long-term use of oxygen and  recurrent episodes of exacerbations.  Past hx. Of smoking and use of cocaine.  She was hospitalized from 8/5-05/28/18 due to acute respiratory failure/COPD exacerbation with hypoxia.  She required BiPap use while in the hospital.  She previously was prescribed CPAP for home use but was not compliant.  She reports that she has been using the BiPap nightly since discharge from hospital and feels better since adding this treatment.  She has been able to decrease her oxygen from 4 to 2L/min constantly.  She now has assistance in applying the mask.  She still has some shortness of breath and has been using her Ventolin inhaler numerous times throughout the day.  She does not have a nebulizer machine and therefore, has not performed Duoneb nebulizer treatments since returning home.  She is able to participate in her own ADLs, is ambulatory and continent.  She has assistance with obtaining meals.   Palliative Care was asked to help address goals of care and advanced care planning.  CODE STATUS: FULL CODE  PPS: 40% HOSPICE ELIGIBILITY/DIAGNOSIS: TBD  PAST MEDICAL HISTORY:  Past Medical History:  Diagnosis Date  . ALLERGIC RHINITIS 05/01/2007  . Anxiety   . Arthritis    "hands" (12/28/2015)  . ASTHMA 05/01/2007   "since I was a child"  . Cancer (HCC)    LUNG  . CHF (congestive heart failure) ([place])   . Chronic bronchitis ([place])   . COPD (chronic obstructive pulmonary disease) ([place])   . DEPRESSION 07/21/2008  .  DVT (deep venous thrombosis) (HCC) 1960s   LLE  . FATIGUE 10/10/2010  . HYPERTENSION 05/01/2007  . HYPERTHYROIDISM 11/23/2007   Pt endorses having had Graves disease, possibly radioactive iodine x1, but no thyroidectomy  . HYPOTHYROIDISM 08/23/2009  . LUMBAR RADICULOPATHY, RIGHT 08/25/2008  . On home oxygen therapy    "3-4L; qd; all the time" (12/28/2015)  . OSTEOPOROSIS 05/01/2007  . SHOULDER PAIN, LEFT 08/23/2009  . SINUSITIS, CHRONIC 10/10/2010    SOCIAL HX:   Social History   Tobacco Use  . Smoking status: Former Smoker    Packs/day: 1.00    Years: 54.00    Pack years: 54.00    Types: Cigarettes    Last attempt to quit: 07/21/2015    Years since quitting: 2.8  . Smokeless tobacco: Never Used  Substance Use Topics  . Alcohol use: Yes    Comment: 1-4 beers per month     ALLERGIES:  Allergies  Allergen Reactions  . Alendronate Sodium Other (See Comments)    Patient does not remember this reaction     PERTINENT MEDICATIONS:  Outpatient Encounter Medications as of 06/10/2018  Medication Sig  . albuterol (VENTOLIN HFA) 108 (90 Base) MCG/ACT inhaler Inhale 2 puffs into the lungs every 6 (six) hours as needed for wheezing or shortness of breath.  . ALPRAZolam (XANAX) 0.25 MG tablet Take 0.25 mg by mouth 2 (two) times daily.  [name] aspirin EC 81 MG EC tablet Take 1 tablet (81 mg total) by mouth daily.  . citalopram (CELEXA) 20 MG tablet Take 20 mg by mouth daily.  . feeding supplement, ENSURE ENLIVE, (ENSURE ENLIVE) LIQD Take 237 mLs by mouth 3 (three) times daily between meals.  . fluticasone furoate-vilanterol (BREO ELLIPTA) 200-25 MCG/INH AEPB Inhale 1 puff into the lungs daily.  . furosemide (LASIX) 40 MG tablet Take 1 tablet (40 mg total) by mouth daily as needed for edema.  . hydroxypropyl methylcellulose / hypromellose (ISOPTO TEARS / GONIOVISC) 2.5 % ophthalmic solution Place 1 drop into both eyes as needed for dry eyes. (Patient not taking: Reported on 06/10/2018)  . ipratropium-albuterol (DUONEB) 0.5-2.5 (3) MG/3ML SOLN Take 3 mLs by nebulization 2 (two) times daily. (Patient not taking: Reported on 05/24/2018)  . levothyroxine (SYNTHROID, LEVOTHROID) 75 MCG tablet TAKE 1 TABLET BY MOUTH DAILY BEFORE BREAKFAST.  [name] potassium chloride (K-DUR) 10 MEQ tablet Take 10 mEq by mouth daily.  . predniSONE (DELTASONE) 50 MG tablet Take Prednisone 50 mg daily for 5 days (Patient not taking: Reported on 06/10/2018)  . PRESCRIPTION MEDICATION Cpap  .  traMADol (ULTRAM) 50 MG tablet Take 1 tablet (50 mg total) by mouth 2 (two) times daily as needed.   No facility-administered encounter medications on file as of 06/10/2018.     PHYSICAL EXAM:   BP 110/82  P 82  R 22  Sats  78% with exertion and increased to 96% with rest and O2 use  General: Chonically ill  appearing, thin.  In NAD Cardiovascular: regular rate and rhythm Pulmonary: clear throughout all lung fields.  O2 via [place] in use.  Mildly ShOB with exertion Abdomen: soft, nontender, + bowel sounds Extremities: no edema or ecchymosis Skin: exposed skin is intact Neurological: Alert and oriented x3.  Generalized weakness Psych:  Very pleasant and positive mood.  Calm and cooperative  Gonzella Lex, NP-C

## 2018-06-14 DIAGNOSIS — J9612 Chronic respiratory failure with hypercapnia: Secondary | ICD-10-CM | POA: Diagnosis not present

## 2018-06-14 DIAGNOSIS — E039 Hypothyroidism, unspecified: Secondary | ICD-10-CM

## 2018-06-14 DIAGNOSIS — I5030 Unspecified diastolic (congestive) heart failure: Secondary | ICD-10-CM | POA: Diagnosis not present

## 2018-06-14 DIAGNOSIS — I110 Hypertensive heart disease with heart failure: Secondary | ICD-10-CM | POA: Diagnosis not present

## 2018-06-14 DIAGNOSIS — I2781 Cor pulmonale (chronic): Secondary | ICD-10-CM | POA: Diagnosis not present

## 2018-06-14 DIAGNOSIS — Z87891 Personal history of nicotine dependence: Secondary | ICD-10-CM

## 2018-06-14 DIAGNOSIS — G4733 Obstructive sleep apnea (adult) (pediatric): Secondary | ICD-10-CM

## 2018-06-14 DIAGNOSIS — Z7982 Long term (current) use of aspirin: Secondary | ICD-10-CM

## 2018-06-14 DIAGNOSIS — Z9981 Dependence on supplemental oxygen: Secondary | ICD-10-CM

## 2018-06-14 DIAGNOSIS — M810 Age-related osteoporosis without current pathological fracture: Secondary | ICD-10-CM

## 2018-06-14 DIAGNOSIS — J441 Chronic obstructive pulmonary disease with (acute) exacerbation: Secondary | ICD-10-CM | POA: Diagnosis not present

## 2018-06-14 DIAGNOSIS — F419 Anxiety disorder, unspecified: Secondary | ICD-10-CM

## 2018-06-14 DIAGNOSIS — F329 Major depressive disorder, single episode, unspecified: Secondary | ICD-10-CM | POA: Diagnosis not present

## 2018-06-14 DIAGNOSIS — E43 Unspecified severe protein-calorie malnutrition: Secondary | ICD-10-CM

## 2018-06-14 DIAGNOSIS — Z7951 Long term (current) use of inhaled steroids: Secondary | ICD-10-CM

## 2018-06-14 NOTE — Telephone Encounter (Signed)
Called and spoke with patient regarding a new nebulizer order TP okayed authorizing a new nebulizer machine order for pt on RA's behalf as he is out of the office this week. Placed order for neb machine today to [facility] Pt verbalized understanding Nothing further needed.

## 2018-06-14 NOTE — Telephone Encounter (Signed)
TP, would you be ok authorizing a neb machine order for the patient on RA's behalf? Thanks!

## 2018-06-14 NOTE — Telephone Encounter (Signed)
yes

## 2018-06-15 ENCOUNTER — Encounter: Payer: Self-pay | Admitting: *Deleted

## 2018-06-15 ENCOUNTER — Other Ambulatory Visit: Payer: Self-pay | Admitting: *Deleted

## 2018-06-15 NOTE — Patient Outreach (Signed)
[facility]) Care Management  06/15/2018  Jasmine [name] Apr 01, 1946 [id number]  CSW was able to meet with patient today to perform a routine home visit.  Patient appeared to be in good spirits today and reported feeling "much better".  Patient's live-in boyfriend/ex-husband, [name] decided that he did not wish to be present for the home visit today, which allowed CSW and patient an opportunity to discuss concerns regarding their relationship.  Patient reported that she separated from Mr. Tamala Julian in 67 and then finally divorced in 60.  Patient then remarried in December 18, 1990 to her second husband, Myka Hitz until his untimely death in Dec 19, 2015.  Patient and Mr. Tamala Julian got back together after Mr. Crew death in 2015/12/19.  Mr. Prewitt had three children from his previous marriage, whom patient is still very close too.  However, patient never had biological children of her own.    Patient admits to being a little frustrated at times because she wants to be able to do more, but realizes that she has limitations due to her current health conditions.  Patient is grateful for Mr. Tamala Julian and all his emotional and physical support.  Patient also admits that she relies a great deal on her Faith.  Patient believes that Mr. Tamala Julian is still "keeping a chip on his shoulder", but that they are working through their relationship.  Patient admits to having a decent appetite, trying to make healthier food choices.  Patient reports that her sleep pattern has definitely improved since she is wearing the BiPAP Machine (Bilevel Positive Airway Pressure) on a nightly basis.  Patient indicated that she is taking her medications exactly as prescribed and wearing her oxygen around the clock.  Patient stated that she is practicing her deep breathing exercises and relaxation techniques when she becomes anxious or begins experiencing symptoms of depression. Patient believes that her symptoms of depression have decreased  significantly as a result of meeting and conversing with CSW.  Patient agrees to continue to practice what she has learned and notify CSW directly if anything changes.  Patient will continue to receive home health services, in the form of physical therapy, occupational therapy and a nurse, through [facility].  Patient has a hospital follow-up appointment with her Primary Care Physician, Dr. [name], scheduled for Wednesday, June 16, 2018 at 1:00PM.  Patient will discuss her concerns regarding her recent constipation with [name] and his nurse.  Patient is also being followed by [facility], which she elected on May 28, 2018, when she was discharged from the hospital.  Palliative care services is following along to provide pain management services.  Palliative care has agreed to follow-up with patient at least twice per month, until otherwise specified by patient or progression of patient's illnesses.  CSW will perform a case closure on patient, as all goals of treatment have been met from social work standpoint and no additional social work needs have been identified at this time.  CSW will notify patient's RNCM with [facility], Thea Silversmith of CSW's plans to close patient's case.  CSW will fax an update to patient's Primary Care Physician, Dr. [name] to ensure that they are aware of CSW's involvement with patient's plan of care.    [facility] CM Care Plan Problem One     Most Recent Value  Care Plan Problem One  Patient is experiencing signs and symptoms of depression.  Role Documenting the Problem One  Clinical Social Worker  Care Plan for Problem One  Active  [facility] Long Term Goal   Patient will have a decrease in symptoms of depression through counseling and supportive services, within the next 45 days.  [facility]N Long Term Goal Start Date  04/30/18  [facility]N Long Term Goal Met Date  06/15/18  Interventions for Problem One Long  Term Goal  Patient admits to having a significant decrease in her symptoms of depression.  [facility] CM Short Term Goal #1   Patient will meet with CSW for an initial home visit to receive counseling and supportive services, within the next week.  THN CM Short Term Goal #1 Start Date  04/30/18  [facility] CM Short Term Goal #1 Met Date  05/05/18  [facility] CM Short Term Goal #2   Patient will practice deep breathing exercises and relaxation techniques to help cope with symptoms of depression, within the next 30 days.  THN CM Short Term Goal #2 Start Date  04/30/18  [facility] CM Short Term Goal #2 Met Date  05/05/18  [facility] CM Short Term Goal #3  Patient will review list of therapists/counselors provided to her by CSW and decide on an appropriate counselor of choice, within the next 30 days.  THN CM Short Term Goal #3 Start Date  04/30/18  [facility] CM Short Term Goal #3 Met Date  05/19/18  THN CM Short Term Goal #4  Patient and patient's ex-husband will meet with CSW for a routine home visit so that CSW can offer couples counseling and supportive services, within the next week.  THN CM Short Term Goal #4 Start Date  05/19/18  [facility] CM Short Term Goal #4 Met Date  06/15/18  Interventions for Short Term Goal #4  CSW will meet with patient and patient's ex-husband for a routine home visit on Tuesday, June 15, 2018 at 10:00AM.     [name], BSW, MSW, LCSW  Licensed Clinical Social Worker  [place]  Mailing [place] [address], [place], [place] [address] Physical Address-[address], [place], [place] [address] Toll Free Main # [phone] Fax # [phone] Cell # [phone]  Office # [phone] [email or web address]

## 2018-06-16 ENCOUNTER — Telehealth: Payer: Self-pay | Admitting: Internal Medicine

## 2018-06-16 ENCOUNTER — Other Ambulatory Visit (INDEPENDENT_AMBULATORY_CARE_PROVIDER_SITE_OTHER): Payer: Medicare HMO

## 2018-06-16 ENCOUNTER — Encounter: Payer: Self-pay | Admitting: Internal Medicine

## 2018-06-16 ENCOUNTER — Other Ambulatory Visit: Payer: Self-pay

## 2018-06-16 ENCOUNTER — Ambulatory Visit (INDEPENDENT_AMBULATORY_CARE_PROVIDER_SITE_OTHER): Payer: Medicare HMO | Admitting: Internal Medicine

## 2018-06-16 VITALS — BP 116/62 | HR 63 | Temp 98.3°F | Ht 60.0 in | Wt 108.0 lb

## 2018-06-16 DIAGNOSIS — Z23 Encounter for immunization: Secondary | ICD-10-CM

## 2018-06-16 DIAGNOSIS — J9611 Chronic respiratory failure with hypoxia: Secondary | ICD-10-CM | POA: Diagnosis not present

## 2018-06-16 DIAGNOSIS — F418 Other specified anxiety disorders: Secondary | ICD-10-CM

## 2018-06-16 DIAGNOSIS — R739 Hyperglycemia, unspecified: Secondary | ICD-10-CM | POA: Diagnosis not present

## 2018-06-16 DIAGNOSIS — I5032 Chronic diastolic (congestive) heart failure: Secondary | ICD-10-CM

## 2018-06-16 LAB — BASIC METABOLIC PANEL
BUN: 10 mg/dL (ref 6–23)
CO2: 42 mEq/L — ABNORMAL HIGH (ref 19–32)
Calcium: 9.8 mg/dL (ref 8.4–10.5)
Chloride: 91 mEq/L — ABNORMAL LOW (ref 96–112)
Creatinine, Ser: 0.67 mg/dL (ref 0.40–1.20)
GFR: 110.77 mL/min (ref >60.00)
Glucose, Bld: 119 mg/dL — ABNORMAL HIGH (ref 70–99)
POTASSIUM: 4.3 meq/L (ref 3.5–5.1)
SODIUM: 138 meq/L (ref 135–145)

## 2018-06-16 LAB — CBC WITH DIFFERENTIAL/PLATELET
BASOS PCT: 0.7 % (ref 0.0–3.0)
Basophils Absolute: 0 10*3/uL (ref 0.0–0.1)
EOS ABS: 0.2 10*3/uL (ref 0.0–0.7)
Eosinophils Relative: 3.3 % (ref 0.0–5.0)
HCT: 39.4 % (ref 36.0–46.0)
HEMOGLOBIN: 12.7 g/dL (ref 12.0–15.0)
Lymphocytes Relative: 25.3 % (ref 12.0–46.0)
Lymphs Abs: 1.3 10*3/uL (ref 0.7–4.0)
MCHC: 32.4 g/dL (ref 30.0–36.0)
MCV: 96.7 fl (ref 78.0–100.0)
MONO ABS: 0.7 10*3/uL (ref 0.1–1.0)
Monocytes Relative: 12.5 % — ABNORMAL HIGH (ref 3.0–12.0)
NEUTROS ABS: 3.1 10*3/uL (ref 1.4–7.7)
NEUTROS PCT: 58.2 % (ref 43.0–77.0)
PLATELETS: 158 10*3/uL (ref 150.0–400.0)
RBC: 4.07 Mil/uL (ref 3.87–5.11)
RDW: 15 % (ref 11.5–15.5)
WBC: 5.3 10*3/uL (ref 4.0–10.5)

## 2018-06-16 MED ORDER — FUROSEMIDE 40 MG PO TABS: ORAL_TABLET | ORAL | 3 refills | Status: DC

## 2018-06-16 MED ORDER — CITALOPRAM HYDROBROMIDE 20 MG PO TABS: 20.0000 mg | ORAL_TABLET | Freq: Every day | ORAL | 3 refills | Status: DC

## 2018-06-16 NOTE — Telephone Encounter (Signed)
Has appt at 1 pm today

## 2018-06-16 NOTE — Assessment & Plan Note (Signed)
stable overall by history and exam, recent data reviewed with pt, and pt to continue medical treatment as before,  to f/u any worsening symptoms or concerns Lab Results  Component Value Date   HGBA1C 5.7 (H) 03/29/2018

## 2018-06-16 NOTE — Patient Outreach (Signed)
[name]) Care Management  [name]   06/16/2018  Jasmine [name] Jan 23, 1946 [id number]   72 year old female referred to [facility].   [facility] services requested for a 30 day post discharge medication review.  PMHx includes, but not limited to, hypertension, diastolic heart failure, COPD, Hypothyroidism and osteoporosis  Successful outreach to Jasmine [name].  HIPAA identifiers verified.  Subjective: Jasmine [name] reports that she is feeling well.  She states that she has not been using her Duonebs because she is waiting for her new nebulizer machine to arrive.  She states that [facility] has ordered it and she expects it to arrive any day.   She reports that she has a "pimple" where her IV was that she will show [name] at her appointment today.  Patient reports that she continues to use citalopram, despite being told to discontinue this medication at her hospital discharge on 05/24/18.  She state that it works well for her and that she does not want to take the alprazolam that was prescribed.  Of note, she says she never took the alprazolam.  She states that she has no affordability issues with her medications.   Objective:  SCr 0.62 mg/dL  Current Medications: Current Outpatient Medications  Medication Sig Dispense Refill  . albuterol (VENTOLIN HFA) 108 (90 Base) MCG/ACT inhaler Inhale 2 puffs into the lungs every 6 (six) hours as needed for wheezing or shortness of breath.    [name] aspirin EC 81 MG EC tablet Take 1 tablet (81 mg total) by mouth daily. 30 tablet 0  . citalopram (CELEXA) 20 MG tablet Take 20 mg by mouth daily.    . fluticasone furoate-vilanterol (BREO ELLIPTA) 200-25 MCG/INH AEPB Inhale 1 puff into the lungs daily. 1 each 5  . furosemide (LASIX) 40 MG tablet Take 1 tablet (40 mg total) by mouth daily as needed for edema. 90 tablet 3  . levothyroxine (SYNTHROID, LEVOTHROID) 75 MCG tablet TAKE 1 TABLET BY MOUTH DAILY BEFORE BREAKFAST. 90  tablet 0  . potassium chloride (K-DUR) 10 MEQ tablet Take 10 mEq by mouth daily.    [name] PRESCRIPTION MEDICATION Bipap    . traMADol (ULTRAM) 50 MG tablet Take 1 tablet (50 mg total) by mouth 2 (two) times daily as needed. 60 tablet 2  . feeding supplement, ENSURE ENLIVE, (ENSURE ENLIVE) LIQD Take 237 mLs by mouth 3 (three) times daily between meals. (Patient not taking: Reported on 06/16/2018) 237 mL 12  . hydroxypropyl methylcellulose / hypromellose (ISOPTO TEARS / GONIOVISC) 2.5 % ophthalmic solution Place 1 drop into both eyes as needed for dry eyes. (Patient not taking: Reported on 06/10/2018) 15 mL 2  . ipratropium-albuterol (DUONEB) 0.5-2.5 (3) MG/3ML SOLN Take 3 mLs by nebulization 2 (two) times daily. (Patient not taking: Reported on 06/16/2018) 360 mL 3   No current facility-administered medications for this visit.     Functional Status: In your present state of health, do you have any difficulty performing the following activities: 05/26/2018 04/30/2018  Hearing? Y N  Vision? N N  Difficulty concentrating or making decisions? N Y  Comment - -  Walking or climbing stairs? N Y  Dressing or bathing? N Y  Doing errands, shopping? N Y  Conservation officer, nature and eating ? - Y  Using the Toilet? - N  In the past six months, have you accidently leaked urine? - N  Do you have problems with loss of bowel control? - N  Managing your Medications? - Y  Comment - -  Managing your Finances? - N  Housekeeping or managing your Housekeeping? - Y  Some recent data might be hidden    Fall/Depression Screening: Fall Risk  04/30/2018 04/15/2018 04/14/2018  Falls in the past year? Yes Yes Yes  Number falls in past yr: 1 1 1   Injury with Fall? Yes Yes Yes  Risk Factor Category  High Fall Risk High Fall Risk -  Risk for fall due to : History of fall(s) History of fall(s) -  Follow up Education provided;Falls prevention discussed - -   PHQ 2/9 Scores 04/30/2018 04/28/2018 02/19/2017 01/22/2017 12/23/2016 11/25/2016  10/28/2016  PHQ - 2 Score 2 1 0 0 0 0 0  PHQ- 9 Score 4 - - - - - -   ASSESSMENT: Date Discharged from Hospital: 05/28/18 Date Medication Reconciliation Performed: 06/16/2018  Medications Discontinued at Discharge:  Citalpram  New Medications at Discharge:   Prednisone  Patient was recently discharged from hospital and all medications have been reviewed  Drugs sorted by system:  Neurological: alprazolam, citalopram  Cardiovascular: asprin 81 mg, furosemide, potassium chloride  Pulmonary/Allergy: albuterol, fluticasone furoate/vilanterol, ipratropium/abluterol, prednisone (completed)  Endocrine: levothyroxine  Topical: Isopto Tears  Pain: tramadol   Other issues noted:  Patient reports that she has continued citalopram and that it works well for her.  She may need a new prescription if the PCP wants her to remain on this medication.  I removed the alprazolam from the medication list as "patient preference."  She did not get this prescription filled and has not taken it.   Patient reports taking furosemide daily instead of as needed for edema.  Requested MD to clarify directions.  Patient is on [place] and albuterol MDI as she awaits the arrival of her new nebulizer to resume the Duonebs.  Encouraged patient to outreach to [facility] to see if they know when the nebulizer will arrive, since a long holiday weekend is approaching.   Plan: Outreach call placed to [name], to inform him of above issues.  She has an appointment with him at 1 pm.  Will await clarification.  Joetta Manners, PharmD Clinical Pharmacist [place] [phone]  Addendum: Outreach call placed to [name]. New prescriptions in Epic from PCP appointment today.   D/C alprazolam  Continue Citalopram 20 mg daily.  Change in furosemide dosing to 40 mg every morning, with 40 mg in the evening as needed for persistent swelling.   Patient verbalized understanding of  her medication changes and reports she has her next PCP appointment in 3 months.  Patient states that she has no further medication questions or concerns.  Informed her that I will close her [place] case at this time.  She is aware that she can call me in the future should any medication issues arise.   Plan: Route discipline closure letter to PCP, [name].  Inform [facility]N RN, Thea Silversmith of pharmacy case closure.  Joetta Manners, PharmD Clinical Pharmacist [place] [phone]

## 2018-06-16 NOTE — Progress Notes (Signed)
Subjective:    Patient ID: Jasmine [name], female    DOB: 1946-08-07, 72 y.o.   MRN: [id number]  HPI     Here to f/u; overall doing ok,  Pt denies chest pain, increasing sob or doe, wheezing, orthopnea, PND, increased LE swelling, palpitations, dizziness or syncope.  Pt denies new neurological symptoms such as new headache, or facial or extremity weakness or numbness.  Pt denies polydipsia, polyuria, or low sugar episode.  Pt states overall good compliance with meds, mostly trying to follow appropriate diet, with wt overall stable,  but little exercise however. Quit smoking about 2017 after second husband Jasmine [name] passed.  Using the Bipap at night, placed at bedtime by her reunification with her first husband (who never remarried) and now lives with him Jasmine [name]). Does all ADLs herself otherwise.  Asks to restart celexa as recently ran out. Taking the lasix in the AM daily, and only occas in the PM if worsening edema and wt gain, but hesitates due to having the bipap at night and getting up to BR is quite an ordeal.    Pt does not want xanax or similar as she had difficulty with coming off this in the past Past Medical History:  Diagnosis Date  . ALLERGIC RHINITIS 05/01/2007  . Anxiety   . Arthritis    "hands" (12/28/2015)  . ASTHMA 05/01/2007   "since I was a child"  . Cancer (HCC)    LUNG  . CHF (congestive heart failure) ([place])   . Chronic bronchitis ([place])   . COPD (chronic obstructive pulmonary disease) ([place])   . DEPRESSION 07/21/2008  . DVT (deep venous thrombosis) (HCC) 1960s   LLE  . FATIGUE 10/10/2010  . HYPERTENSION 05/01/2007  . HYPERTHYROIDISM 11/23/2007   Pt endorses having had Graves disease, possibly radioactive iodine x1, but no thyroidectomy  . HYPOTHYROIDISM 08/23/2009  . LUMBAR RADICULOPATHY, RIGHT 08/25/2008  . On home oxygen therapy    "3-4L; qd; all the time" (12/28/2015)  . OSTEOPOROSIS 05/01/2007  . SHOULDER PAIN, LEFT 08/23/2009  . SINUSITIS, CHRONIC 10/10/2010   Past  Surgical History:  Procedure Laterality Date  . APPENDECTOMY    . DILATION AND CURETTAGE OF UTERUS  multiple   history of multiple dialations and curettages and miscarriages, unfortunately never carrying a child to term  . ECTOPIC PREGNANCY SURGERY  "late '60s or early '70s  . ELECTROCARDIOGRAM  06/20/2006  . OOPHORECTOMY Right     reports that she quit smoking about 2 years ago. Her smoking use included cigarettes. She has a 54.00 pack-year smoking history. She has never used smokeless tobacco. She reports that she drinks alcohol. She reports that she has current or past drug history. Drug: "Crack" cocaine. Frequency: 2.00 times per week. family history includes Alcohol abuse in her brother; Asthma in her unknown relative; Diabetes in her brother; Hypertension in her other; Lung cancer in her father; Stroke in her mother; Thyroid disease in her mother. Allergies  Allergen Reactions  . Alendronate Sodium Other (See Comments)    Patient does not remember this reaction   Current Outpatient Medications on File Prior to Visit  Medication Sig Dispense Refill  . albuterol (VENTOLIN HFA) 108 (90 Base) MCG/ACT inhaler Inhale 2 puffs into the lungs every 6 (six) hours as needed for wheezing or shortness of breath.    [name] aspirin EC 81 MG EC tablet Take 1 tablet (81 mg total) by mouth daily. 30 tablet 0  . feeding supplement, ENSURE ENLIVE, (ENSURE ENLIVE) LIQD  Take 237 mLs by mouth 3 (three) times daily between meals. 237 mL 12  . fluticasone furoate-vilanterol (BREO ELLIPTA) 200-25 MCG/INH AEPB Inhale 1 puff into the lungs daily. 1 each 5  . furosemide (LASIX) 40 MG tablet Take 1 tablet (40 mg total) by mouth daily as needed for edema. 90 tablet 3  . hydroxypropyl methylcellulose / hypromellose (ISOPTO TEARS / GONIOVISC) 2.5 % ophthalmic solution Place 1 drop into both eyes as needed for dry eyes. 15 mL 2  . ipratropium-albuterol (DUONEB) 0.5-2.5 (3) MG/3ML SOLN Take 3 mLs by nebulization 2 (two) times  daily. 360 mL 3  . levothyroxine (SYNTHROID, LEVOTHROID) 75 MCG tablet TAKE 1 TABLET BY MOUTH DAILY BEFORE BREAKFAST. 90 tablet 0  . potassium chloride (K-DUR) 10 MEQ tablet Take 10 mEq by mouth daily.    [name] PRESCRIPTION MEDICATION Bipap    . traMADol (ULTRAM) 50 MG tablet Take 1 tablet (50 mg total) by mouth 2 (two) times daily as needed. 60 tablet 2   No current facility-administered medications on file prior to visit.    Review of Systems  Constitutional: Negative for other unusual diaphoresis or sweats HENT: Negative for ear discharge or swelling Eyes: Negative for other worsening visual disturbances Respiratory: Negative for stridor or other swelling  Gastrointestinal: Negative for worsening distension or other blood Genitourinary: Negative for retention or other urinary change Musculoskeletal: Negative for other MSK pain or swelling Skin: Negative for color change or other new lesions Neurological: Negative for worsening tremors and other numbness  Psychiatric/Behavioral: Negative for worsening agitation or other fatigue All other exam findings    Objective:   Physical Exam BP 116/62   Pulse 63   Temp 98.3 F (36.8 C) (Oral)   Ht 5' (1.524 m)   Wt 108 lb (49 kg)   SpO2 98%   BMI 21.09 kg/m  VS noted,  Constitutional: Pt appears in NAD HENT: Head: NCAT.  Right Ear: External ear normal.  Left Ear: External ear normal.  Eyes: . Pupils are equal, round, and reactive to light. Conjunctivae and EOM are normal Nose: without d/c or deformity Neck: Neck supple. Gross normal ROM Cardiovascular: Normal rate and regular rhythm.   Pulmonary/Chest: Effort normal and breath sounds decreased without rales or wheezing.  Abd:  Soft, NT, ND, + BS, no organomegaly Neurological: Pt is alert. At baseline orientation, motor grossly intact Skin: Skin is warm. No rashes, other new lesions, no LE edema Psychiatric: Pt behavior is normal without agitation  No other exam findings Lab Results    Component Value Date   WBC 8.1 05/26/2018   HGB 10.9 (L) 05/26/2018   HCT 34.8 (L) 05/26/2018   PLT 145 (L) 05/26/2018   GLUCOSE 139 (H) 05/26/2018   CHOL 223 (H) 06/03/2017   TRIG 50.0 06/03/2017   HDL 104.10 06/03/2017   LDLDIRECT 102.1 10/10/2010   LDLCALC 109 (H) 06/03/2017   ALT 10 05/24/2018   AST 17 05/24/2018   NA 135 05/26/2018   K 4.4 05/26/2018   CL 85 (L) 05/26/2018   CREATININE 0.62 05/26/2018   BUN 18 05/26/2018   CO2 43 (H) 05/26/2018   TSH 0.378 03/25/2018   INR 1.58 (H) 03/25/2016   HGBA1C 5.7 (H) 03/29/2018      Assessment & Plan:

## 2018-06-16 NOTE — Patient Instructions (Addendum)
You had the Pneumovax pneumonia shot today  Please continue all other medications as before, and refills have been done if requested.  Please have the pharmacy call with any other refills you may need.  Please continue your efforts at being more active, low cholesterol diet, and weight control.  Please keep your appointments with your specialists as you may have planned  Please go to the LAB in the Basement (turn left off the elevator) for the tests to be done today  You will be contacted by phone if any changes need to be made immediately.  Otherwise, you will receive a letter about your results with an explanation, but please check with MyChart first  Please remember to sign up for MyChart if you have not done so, as this will be important to you in the future with finding out test results, communicating by private email, and scheduling acute appointments online when needed.  Please return in 3 months, or sooner if needed

## 2018-06-16 NOTE — Assessment & Plan Note (Signed)
stable overall by history and exam, recent data reviewed with pt, and pt to continue medical treatment as before,  to f/u any worsening symptoms or concerns  

## 2018-06-16 NOTE — Telephone Encounter (Signed)
Copied from [place] [id number]. Topic: Quick Communication - See Telephone Encounter >> Jun 16, 2018 10:37 AM Burchel, Abbi R wrote: CRM for notification. See Telephone encounter for: 06/16/18. Per Jennifer's conversation with Pt today:    Celexa 20mg  1x daily was discont. At her last hosp d/c, but pt is still taking it and states it works well.  She would like to continue taking it.  Pt may need new rx, if Dr Jenny Reichmann wants her to continue taking.   Hosp ordered Alprazolam, but pt has never taken it so [facility] removed it from Med List per pt preference.  Pt states Nebulizer is malfunctioning and [facility] is sending a new one and she has not had her Duoneb in a while at least 1 wk.  Pt is taking Furosemide 40mg  everyday (instead of PRN as stated on Rx label).  Pt is still reporting some edema.  Please advise how pt should be taking going forward.   Please call to update [facility] following appt: [phone]

## 2018-06-16 NOTE — Assessment & Plan Note (Signed)
Ok to restart the celexa, overall stable, declines further counseling

## 2018-06-17 DIAGNOSIS — F419 Anxiety disorder, unspecified: Secondary | ICD-10-CM | POA: Diagnosis not present

## 2018-06-17 DIAGNOSIS — J441 Chronic obstructive pulmonary disease with (acute) exacerbation: Secondary | ICD-10-CM | POA: Diagnosis not present

## 2018-06-17 DIAGNOSIS — M810 Age-related osteoporosis without current pathological fracture: Secondary | ICD-10-CM | POA: Diagnosis not present

## 2018-06-17 DIAGNOSIS — I2781 Cor pulmonale (chronic): Secondary | ICD-10-CM | POA: Diagnosis not present

## 2018-06-17 DIAGNOSIS — I5030 Unspecified diastolic (congestive) heart failure: Secondary | ICD-10-CM | POA: Diagnosis not present

## 2018-06-17 DIAGNOSIS — I110 Hypertensive heart disease with heart failure: Secondary | ICD-10-CM | POA: Diagnosis not present

## 2018-06-17 DIAGNOSIS — F329 Major depressive disorder, single episode, unspecified: Secondary | ICD-10-CM | POA: Diagnosis not present

## 2018-06-17 DIAGNOSIS — J9612 Chronic respiratory failure with hypercapnia: Secondary | ICD-10-CM | POA: Diagnosis not present

## 2018-06-17 DIAGNOSIS — G4733 Obstructive sleep apnea (adult) (pediatric): Secondary | ICD-10-CM | POA: Diagnosis not present

## 2018-06-18 DIAGNOSIS — I2781 Cor pulmonale (chronic): Secondary | ICD-10-CM | POA: Diagnosis not present

## 2018-06-18 DIAGNOSIS — M810 Age-related osteoporosis without current pathological fracture: Secondary | ICD-10-CM | POA: Diagnosis not present

## 2018-06-18 DIAGNOSIS — F329 Major depressive disorder, single episode, unspecified: Secondary | ICD-10-CM | POA: Diagnosis not present

## 2018-06-18 DIAGNOSIS — J9612 Chronic respiratory failure with hypercapnia: Secondary | ICD-10-CM | POA: Diagnosis not present

## 2018-06-18 DIAGNOSIS — I110 Hypertensive heart disease with heart failure: Secondary | ICD-10-CM | POA: Diagnosis not present

## 2018-06-18 DIAGNOSIS — J441 Chronic obstructive pulmonary disease with (acute) exacerbation: Secondary | ICD-10-CM | POA: Diagnosis not present

## 2018-06-18 DIAGNOSIS — G4733 Obstructive sleep apnea (adult) (pediatric): Secondary | ICD-10-CM | POA: Diagnosis not present

## 2018-06-18 DIAGNOSIS — I5030 Unspecified diastolic (congestive) heart failure: Secondary | ICD-10-CM | POA: Diagnosis not present

## 2018-06-18 DIAGNOSIS — F419 Anxiety disorder, unspecified: Secondary | ICD-10-CM | POA: Diagnosis not present

## 2018-06-22 DIAGNOSIS — I5030 Unspecified diastolic (congestive) heart failure: Secondary | ICD-10-CM | POA: Diagnosis not present

## 2018-06-22 DIAGNOSIS — F419 Anxiety disorder, unspecified: Secondary | ICD-10-CM | POA: Diagnosis not present

## 2018-06-22 DIAGNOSIS — I110 Hypertensive heart disease with heart failure: Secondary | ICD-10-CM | POA: Diagnosis not present

## 2018-06-22 DIAGNOSIS — M810 Age-related osteoporosis without current pathological fracture: Secondary | ICD-10-CM | POA: Diagnosis not present

## 2018-06-22 DIAGNOSIS — G4733 Obstructive sleep apnea (adult) (pediatric): Secondary | ICD-10-CM | POA: Diagnosis not present

## 2018-06-22 DIAGNOSIS — J9612 Chronic respiratory failure with hypercapnia: Secondary | ICD-10-CM | POA: Diagnosis not present

## 2018-06-22 DIAGNOSIS — J441 Chronic obstructive pulmonary disease with (acute) exacerbation: Secondary | ICD-10-CM | POA: Diagnosis not present

## 2018-06-22 DIAGNOSIS — F329 Major depressive disorder, single episode, unspecified: Secondary | ICD-10-CM | POA: Diagnosis not present

## 2018-06-22 DIAGNOSIS — I2781 Cor pulmonale (chronic): Secondary | ICD-10-CM | POA: Diagnosis not present

## 2018-06-23 DIAGNOSIS — J9612 Chronic respiratory failure with hypercapnia: Secondary | ICD-10-CM | POA: Diagnosis not present

## 2018-06-23 DIAGNOSIS — F419 Anxiety disorder, unspecified: Secondary | ICD-10-CM | POA: Diagnosis not present

## 2018-06-23 DIAGNOSIS — I2781 Cor pulmonale (chronic): Secondary | ICD-10-CM | POA: Diagnosis not present

## 2018-06-23 DIAGNOSIS — I110 Hypertensive heart disease with heart failure: Secondary | ICD-10-CM | POA: Diagnosis not present

## 2018-06-23 DIAGNOSIS — I5030 Unspecified diastolic (congestive) heart failure: Secondary | ICD-10-CM | POA: Diagnosis not present

## 2018-06-23 DIAGNOSIS — J441 Chronic obstructive pulmonary disease with (acute) exacerbation: Secondary | ICD-10-CM | POA: Diagnosis not present

## 2018-06-23 DIAGNOSIS — G4733 Obstructive sleep apnea (adult) (pediatric): Secondary | ICD-10-CM | POA: Diagnosis not present

## 2018-06-23 DIAGNOSIS — M810 Age-related osteoporosis without current pathological fracture: Secondary | ICD-10-CM | POA: Diagnosis not present

## 2018-06-23 DIAGNOSIS — F329 Major depressive disorder, single episode, unspecified: Secondary | ICD-10-CM | POA: Diagnosis not present

## 2018-06-24 ENCOUNTER — Telehealth: Payer: Self-pay | Admitting: Pulmonary Disease

## 2018-06-24 DIAGNOSIS — J449 Chronic obstructive pulmonary disease, unspecified: Secondary | ICD-10-CM

## 2018-06-24 DIAGNOSIS — I2781 Cor pulmonale (chronic): Secondary | ICD-10-CM | POA: Diagnosis not present

## 2018-06-24 DIAGNOSIS — G4733 Obstructive sleep apnea (adult) (pediatric): Secondary | ICD-10-CM | POA: Diagnosis not present

## 2018-06-24 DIAGNOSIS — J9612 Chronic respiratory failure with hypercapnia: Secondary | ICD-10-CM

## 2018-06-24 DIAGNOSIS — F419 Anxiety disorder, unspecified: Secondary | ICD-10-CM | POA: Diagnosis not present

## 2018-06-24 DIAGNOSIS — I5030 Unspecified diastolic (congestive) heart failure: Secondary | ICD-10-CM | POA: Diagnosis not present

## 2018-06-24 DIAGNOSIS — F329 Major depressive disorder, single episode, unspecified: Secondary | ICD-10-CM | POA: Diagnosis not present

## 2018-06-24 DIAGNOSIS — I110 Hypertensive heart disease with heart failure: Secondary | ICD-10-CM | POA: Diagnosis not present

## 2018-06-24 DIAGNOSIS — M810 Age-related osteoporosis without current pathological fracture: Secondary | ICD-10-CM | POA: Diagnosis not present

## 2018-06-24 DIAGNOSIS — J441 Chronic obstructive pulmonary disease with (acute) exacerbation: Secondary | ICD-10-CM | POA: Diagnosis not present

## 2018-06-24 NOTE — Telephone Encounter (Addendum)
Spoke with pt, she states she is not breathing well this morning. The nurse from [facility] stated her vitals were good. She was in the emergency room recently but did follow up with [name] but still is having increased SOB.   She needs a nebulizer machine from [facility], previous message states the order was sent   She is also having trouble with her bipap machine mask, it comes off at night. She stated she would go to [facility] to see if she can use another mask that would be better for her.   Called [facility] [place] but he did not answer. I will try again later.

## 2018-06-24 NOTE — Telephone Encounter (Signed)
Spoke with Jasmine [name] he states she should be eligible for a new nebulizer but he just needs an order. As far as the mask for her Astral NIV machine, Jasmine [name] suggested we order a mask fitting to help figure out which mask is more comfortable to use. TP could you sign off on the order since RA is on vacation?

## 2018-06-25 ENCOUNTER — Telehealth: Payer: Self-pay | Admitting: Pulmonary Disease

## 2018-06-25 ENCOUNTER — Other Ambulatory Visit: Payer: Self-pay

## 2018-06-25 DIAGNOSIS — J449 Chronic obstructive pulmonary disease, unspecified: Secondary | ICD-10-CM | POA: Diagnosis not present

## 2018-06-25 NOTE — Telephone Encounter (Signed)
Per TP: okay for new neb machine and mask fitting for Astral NIV.  Thank you.

## 2018-06-25 NOTE — Telephone Encounter (Signed)
The order was sent and a staff message was sent to [facility].. I advised pt we would follow up on this Monday but it looks like TP just needs to sign the order. JJ can you make sure TP signs the order on Monday.

## 2018-06-25 NOTE — Patient Outreach (Signed)
[facility]) Care Management  06/25/2018  Jasmine [name] [birth year] [id number]   RNCM received message from care management assistant that client called regarding nebulizer machine. RNCM returned call to client - encouraged client to [facility] with [facility] and reinforced that pulmonologist was also sending an order regarding her CPAP machine to [facility]. Mrs. Behne states she will go to [facility] regarding both these equipment items. RNCM encouraged client to call back if she has any questions or problems.   Plan: continue to follow. Home visit previously scheduled in two weeks.  Thea Silversmith, RN, MSN, [place] Coordinator Cell: [phone]

## 2018-06-25 NOTE — Telephone Encounter (Addendum)
Orders have been placed per Tammy Parrett. Spoke with pt. She is aware of this information. Nothing further was needed.

## 2018-06-28 DIAGNOSIS — E43 Unspecified severe protein-calorie malnutrition: Secondary | ICD-10-CM | POA: Diagnosis not present

## 2018-06-28 DIAGNOSIS — J449 Chronic obstructive pulmonary disease, unspecified: Secondary | ICD-10-CM | POA: Diagnosis not present

## 2018-06-28 DIAGNOSIS — J9690 Respiratory failure, unspecified, unspecified whether with hypoxia or hypercapnia: Secondary | ICD-10-CM | POA: Diagnosis not present

## 2018-06-28 DIAGNOSIS — I2781 Cor pulmonale (chronic): Secondary | ICD-10-CM | POA: Diagnosis not present

## 2018-06-28 DIAGNOSIS — J9612 Chronic respiratory failure with hypercapnia: Secondary | ICD-10-CM | POA: Diagnosis not present

## 2018-06-28 DIAGNOSIS — B9561 Methicillin susceptible Staphylococcus aureus infection as the cause of diseases classified elsewhere: Secondary | ICD-10-CM | POA: Diagnosis not present

## 2018-06-28 DIAGNOSIS — F418 Other specified anxiety disorders: Secondary | ICD-10-CM | POA: Diagnosis not present

## 2018-06-28 DIAGNOSIS — R7881 Bacteremia: Secondary | ICD-10-CM | POA: Diagnosis not present

## 2018-06-28 DIAGNOSIS — E890 Postprocedural hypothyroidism: Secondary | ICD-10-CM | POA: Diagnosis not present

## 2018-06-29 ENCOUNTER — Telehealth: Payer: Self-pay | Admitting: Internal Medicine

## 2018-06-29 DIAGNOSIS — I2781 Cor pulmonale (chronic): Secondary | ICD-10-CM | POA: Diagnosis not present

## 2018-06-29 DIAGNOSIS — I5030 Unspecified diastolic (congestive) heart failure: Secondary | ICD-10-CM | POA: Diagnosis not present

## 2018-06-29 DIAGNOSIS — F419 Anxiety disorder, unspecified: Secondary | ICD-10-CM | POA: Diagnosis not present

## 2018-06-29 DIAGNOSIS — F329 Major depressive disorder, single episode, unspecified: Secondary | ICD-10-CM | POA: Diagnosis not present

## 2018-06-29 DIAGNOSIS — M810 Age-related osteoporosis without current pathological fracture: Secondary | ICD-10-CM | POA: Diagnosis not present

## 2018-06-29 DIAGNOSIS — J9612 Chronic respiratory failure with hypercapnia: Secondary | ICD-10-CM | POA: Diagnosis not present

## 2018-06-29 DIAGNOSIS — I110 Hypertensive heart disease with heart failure: Secondary | ICD-10-CM | POA: Diagnosis not present

## 2018-06-29 DIAGNOSIS — G4733 Obstructive sleep apnea (adult) (pediatric): Secondary | ICD-10-CM | POA: Diagnosis not present

## 2018-06-29 DIAGNOSIS — J441 Chronic obstructive pulmonary disease with (acute) exacerbation: Secondary | ICD-10-CM | POA: Diagnosis not present

## 2018-06-29 MED ORDER — HYPROMELLOSE (GONIOSCOPIC) 2.5 % OP SOLN: 1.0000 [drp] | OPHTHALMIC | 2 refills | Status: DC | PRN

## 2018-06-29 NOTE — Telephone Encounter (Signed)
TP cosigned the order [facility] and spoke with Corene Cornea to verify this was done  Nothing further needed; will sign off

## 2018-06-29 NOTE — Telephone Encounter (Signed)
Pt never picked up her rx for hydroxypropyl methylcellulose / hypromellose (ISOPTO TEARS / GONIOVISC) 2.5 % ophthalmic solution    She would like this resent in to the pharmacy. Please advise

## 2018-07-01 ENCOUNTER — Other Ambulatory Visit: Payer: BC Managed Care – PPO | Admitting: Internal Medicine

## 2018-07-01 DIAGNOSIS — G4733 Obstructive sleep apnea (adult) (pediatric): Secondary | ICD-10-CM | POA: Diagnosis not present

## 2018-07-01 DIAGNOSIS — I2781 Cor pulmonale (chronic): Secondary | ICD-10-CM | POA: Diagnosis not present

## 2018-07-01 DIAGNOSIS — J9612 Chronic respiratory failure with hypercapnia: Secondary | ICD-10-CM | POA: Diagnosis not present

## 2018-07-01 DIAGNOSIS — F329 Major depressive disorder, single episode, unspecified: Secondary | ICD-10-CM | POA: Diagnosis not present

## 2018-07-01 DIAGNOSIS — F419 Anxiety disorder, unspecified: Secondary | ICD-10-CM | POA: Diagnosis not present

## 2018-07-01 DIAGNOSIS — I110 Hypertensive heart disease with heart failure: Secondary | ICD-10-CM | POA: Diagnosis not present

## 2018-07-01 DIAGNOSIS — R0602 Shortness of breath: Secondary | ICD-10-CM | POA: Diagnosis not present

## 2018-07-01 DIAGNOSIS — I5030 Unspecified diastolic (congestive) heart failure: Secondary | ICD-10-CM | POA: Diagnosis not present

## 2018-07-01 DIAGNOSIS — I509 Heart failure, unspecified: Secondary | ICD-10-CM | POA: Diagnosis not present

## 2018-07-01 DIAGNOSIS — J441 Chronic obstructive pulmonary disease with (acute) exacerbation: Secondary | ICD-10-CM | POA: Diagnosis not present

## 2018-07-01 DIAGNOSIS — Z515 Encounter for palliative care: Secondary | ICD-10-CM | POA: Diagnosis not present

## 2018-07-01 DIAGNOSIS — M810 Age-related osteoporosis without current pathological fracture: Secondary | ICD-10-CM | POA: Diagnosis not present

## 2018-07-03 DIAGNOSIS — I2781 Cor pulmonale (chronic): Secondary | ICD-10-CM | POA: Diagnosis not present

## 2018-07-03 DIAGNOSIS — B9561 Methicillin susceptible Staphylococcus aureus infection as the cause of diseases classified elsewhere: Secondary | ICD-10-CM | POA: Diagnosis not present

## 2018-07-03 DIAGNOSIS — J449 Chronic obstructive pulmonary disease, unspecified: Secondary | ICD-10-CM | POA: Diagnosis not present

## 2018-07-03 DIAGNOSIS — E43 Unspecified severe protein-calorie malnutrition: Secondary | ICD-10-CM | POA: Diagnosis not present

## 2018-07-03 DIAGNOSIS — J9612 Chronic respiratory failure with hypercapnia: Secondary | ICD-10-CM | POA: Diagnosis not present

## 2018-07-03 DIAGNOSIS — J9690 Respiratory failure, unspecified, unspecified whether with hypoxia or hypercapnia: Secondary | ICD-10-CM | POA: Diagnosis not present

## 2018-07-03 DIAGNOSIS — R7881 Bacteremia: Secondary | ICD-10-CM | POA: Diagnosis not present

## 2018-07-03 DIAGNOSIS — E890 Postprocedural hypothyroidism: Secondary | ICD-10-CM | POA: Diagnosis not present

## 2018-07-03 DIAGNOSIS — F418 Other specified anxiety disorders: Secondary | ICD-10-CM | POA: Diagnosis not present

## 2018-07-03 NOTE — Progress Notes (Signed)
PALLIATIVE CARE CONSULT VISIT   PATIENT NAME: Jasmine [name] DOB: [birth year] MRN: [id number]  PRIMARY CARE PROVIDER:   Biagio Borg, MD  REFERRING PROVIDER:  Biagio Borg, MD [place], [place] [address]  RESPONSIBLE PARTY:   Self  RECOMMENDATIONS and PLAN:  1.  Shortness of breath  R06.02:  At baseline.  Continuous use of O2 at 2L/min.  Increase use of BiPap( she is having difficulty replacing mask after night time elimination visits).   She has begun neb treatments BID.    2.  Edema due to congestive heart failure  I50.9:  Related to inconsistent use of 2nd Lasix dose.  Strongly encouraged to restart Lasix BID as ordered and continue to monitor daily weights. Compression socks and leg elevation.  CHF management techniques reinforced.  3.  Palliative care  Encounter:  Pending appointment for completion of Will  And finalize her advanced directives.  Long term goals are to manage CHF and COPD.  Prevent re-hospitalizations.  Palliative care will continue to follow.   I spent 45 minutes providing this consultation,  from 11:00am to 11:45am. More than 50% of the time in this consultation was spent coordinating communication.   HISTORY OF PRESENT ILLNESS: Followup with [name] finds that she has been without hospitalization since last visit.  She reports no worsening of shortness of breath.  She continues to use oxygen at 2L/min and BiPaP at night.  BiPaP is interrupted occasionally.  Appetite is good and no reports of falls.  She did find her nebulizer and has begun prescribed treatments. Weight has increased this week by 5 lbs.     CODE STATUS:  FULL CODE  PPS: 50% HOSPICE ELIGIBILITY/DIAGNOSIS: TBD  PAST MEDICAL HISTORY:  Past Medical History:  Diagnosis Date  . ALLERGIC RHINITIS 05/01/2007  . Anxiety   . Arthritis    "hands" (12/28/2015)  . ASTHMA 05/01/2007   "since I was a child"  . Cancer (HCC)    LUNG  . CHF (congestive heart failure) ([place])     . Chronic bronchitis ([place])   . COPD (chronic obstructive pulmonary disease) ([place])   . DEPRESSION 07/21/2008  . DVT (deep venous thrombosis) (HCC) 1960s   LLE  . FATIGUE 10/10/2010  . HYPERTENSION 05/01/2007  . HYPERTHYROIDISM 11/23/2007   Pt endorses having had Graves disease, possibly radioactive iodine x1, but no thyroidectomy  . HYPOTHYROIDISM 08/23/2009  . LUMBAR RADICULOPATHY, RIGHT 08/25/2008  . On home oxygen therapy    "3-4L; qd; all the time" (12/28/2015)  . OSTEOPOROSIS 05/01/2007  . SHOULDER PAIN, LEFT 08/23/2009  . SINUSITIS, CHRONIC 10/10/2010    SOCIAL HX:  Social History   Tobacco Use  . Smoking status: Former Smoker    Packs/day: 1.00    Years: 54.00    Pack years: 54.00    Types: Cigarettes    Last attempt to quit: 07/21/2015    Years since quitting: 2.9  . Smokeless tobacco: Never Used  Substance Use Topics  . Alcohol use: Yes    Comment: 1-4 beers per month     ALLERGIES:  Allergies  Allergen Reactions  . Alendronate Sodium Other (See Comments)    Patient does not remember this reaction     PERTINENT MEDICATIONS:  Outpatient Encounter Medications as of 07/01/2018  Medication Sig  . albuterol (VENTOLIN HFA) 108 (90 Base) MCG/ACT inhaler Inhale 2 puffs into the lungs every 6 (six) hours as needed for wheezing or shortness of  breath.  [name] aspirin EC 81 MG EC tablet Take 1 tablet (81 mg total) by mouth daily.  . citalopram (CELEXA) 20 MG tablet Take 1 tablet (20 mg total) by mouth daily.  . feeding supplement, ENSURE ENLIVE, (ENSURE ENLIVE) LIQD Take 237 mLs by mouth 3 (three) times daily between meals.  . fluticasone furoate-vilanterol (BREO ELLIPTA) 200-25 MCG/INH AEPB Inhale 1 puff into the lungs daily.  . furosemide (LASIX) 40 MG tablet 1 tab by mouth in the AM daily, with 1 tab in the PM as needed for persistent swelling  . hydroxypropyl methylcellulose / hypromellose (ISOPTO TEARS / GONIOVISC) 2.5 % ophthalmic solution Place 1 drop into both eyes as needed  for dry eyes.  [name] ipratropium-albuterol (DUONEB) 0.5-2.5 (3) MG/3ML SOLN Take 3 mLs by nebulization 2 (two) times daily.  [name] levothyroxine (SYNTHROID, LEVOTHROID) 75 MCG tablet TAKE 1 TABLET BY MOUTH DAILY BEFORE BREAKFAST.  [name] potassium chloride (K-DUR) 10 MEQ tablet Take 10 mEq by mouth daily.  [name] PRESCRIPTION MEDICATION Bipap  . traMADol (ULTRAM) 50 MG tablet Take 1 tablet (50 mg total) by mouth 2 (two) times daily as needed.   No facility-administered encounter medications on file as of 07/01/2018.     PHYSICAL EXAM:   General: Chronically ill appearing.  In NAD Cardiovascular: regular rate and rhythm Pulmonary: Expiratory wheeze RLL.  O2 via [place] in use Abdomen: soft, nontender, + bowel sounds Extremities: 2+ edema BLE ankles and feet Skin: no rashes, exposed skin is intact Neurological: A&O x 3  Gonzella Lex, NP-C

## 2018-07-06 ENCOUNTER — Telehealth: Payer: Self-pay | Admitting: Pulmonary Disease

## 2018-07-06 ENCOUNTER — Other Ambulatory Visit: Payer: Self-pay

## 2018-07-06 DIAGNOSIS — R911 Solitary pulmonary nodule: Secondary | ICD-10-CM | POA: Diagnosis not present

## 2018-07-06 DIAGNOSIS — J449 Chronic obstructive pulmonary disease, unspecified: Secondary | ICD-10-CM | POA: Diagnosis not present

## 2018-07-06 DIAGNOSIS — J9611 Chronic respiratory failure with hypoxia: Secondary | ICD-10-CM | POA: Diagnosis not present

## 2018-07-06 DIAGNOSIS — R300 Dysuria: Secondary | ICD-10-CM | POA: Diagnosis not present

## 2018-07-06 NOTE — Telephone Encounter (Signed)
Schedule office visit, okay to stay on 3 L until and

## 2018-07-06 NOTE — Patient Outreach (Signed)
[facility]) Care Management   07/06/2018  RAKEB KIBBLE 07/28/1946 [id number]  Jasmine [name] is an 72 y.o. female  Subjective: client without questions or concerns, denies shortness of breath at rest, but reports increased shortness of breath with activity.  Objective:  BP 104/60   Pulse 81   Resp 20   Ht 1.524 m (5')   Wt 108 lb 6.4 oz (49.2 kg)   SpO2 (!) 84% Comment: range 79-84% resting/sitting.  BMI 21.17 kg/m   Review of Systems  Respiratory:       Decreased breath sounds.  Cardiovascular:       S1S2 noted. 1+ edema noted lower  Leg ankle/foot left greater than right.    Physical Exam skin warm dry, color within normal limits.  Encounter Medications:   Outpatient Encounter Medications as of 07/06/2018  Medication Sig Note  . albuterol (VENTOLIN HFA) 108 (90 Base) MCG/ACT inhaler Inhale 2 puffs into the lungs every 6 (six) hours as needed for wheezing or shortness of breath.   [name] aspirin EC 81 MG EC tablet Take 1 tablet (81 mg total) by mouth daily.   . citalopram (CELEXA) 20 MG tablet Take 1 tablet (20 mg total) by mouth daily.   . fluticasone furoate-vilanterol (BREO ELLIPTA) 200-25 MCG/INH AEPB Inhale 1 puff into the lungs daily.   . furosemide (LASIX) 40 MG tablet 1 tab by mouth in the AM daily, with 1 tab in the PM as needed for persistent swelling   . ipratropium-albuterol (DUONEB) 0.5-2.5 (3) MG/3ML SOLN Take 3 mLs by nebulization 2 (two) times daily.   [name] levothyroxine (SYNTHROID, LEVOTHROID) 75 MCG tablet TAKE 1 TABLET BY MOUTH DAILY BEFORE BREAKFAST.   [name] potassium chloride (K-DUR) 10 MEQ tablet Take 10 mEq by mouth daily. 06/10/2018: Reports she takes potassium when she takes a furosemide.  [name] PRESCRIPTION MEDICATION Bipap   . traMADol (ULTRAM) 50 MG tablet Take 1 tablet (50 mg total) by mouth 2 (two) times daily as needed.   . feeding supplement, ENSURE ENLIVE, (ENSURE ENLIVE) LIQD Take 237 mLs by mouth 3 (three) times daily between meals.  (Patient not taking: Reported on 07/06/2018)   . hydroxypropyl methylcellulose / hypromellose (ISOPTO TEARS / GONIOVISC) 2.5 % ophthalmic solution Place 1 drop into both eyes as needed for dry eyes. (Patient not taking: Reported on 07/06/2018)    No facility-administered encounter medications on file as of 07/06/2018.     Functional Status:   In your present state of health, do you have any difficulty performing the following activities: 05/26/2018 04/30/2018  Hearing? Y N  Vision? N N  Difficulty concentrating or making decisions? N Y  Comment - -  Walking or climbing stairs? N Y  Dressing or bathing? N Y  Doing errands, shopping? N Y  Conservation officer, nature and eating ? - Y  Using the Toilet? - N  In the past six months, have you accidently leaked urine? - N  Do you have problems with loss of bowel control? - N  Managing your Medications? - Y  Comment - -  Managing your Finances? - N  Housekeeping or managing your Housekeeping? - Y  Some recent data might be hidden    Fall/Depression Screening:    Fall Risk  06/16/2018 04/30/2018 04/15/2018  Falls in the past year? Yes Yes Yes  Number falls in past yr: 1 1 1   Injury with Fall? No Yes Yes  Risk Factor Category  - High Fall Risk High Fall  Risk  Risk for fall due to : - History of fall(s) History of fall(s)  Follow up - Education provided;Falls prevention discussed -   PHQ 2/9 Scores 06/16/2018 04/30/2018 04/28/2018 02/19/2017 01/22/2017 12/23/2016 11/25/2016  PHQ - 2 Score 0 2 1 0 0 0 0  PHQ- 9 Score - 4 - - - - -    Assessment:  72 year old with history of, but not all inclusive: COPD, Heart failure, depression, HTN, asthma, COPD, cor [place], protein calorie malnutrition, hypothyroidism, hyperglycemia, polysubstance abuse, acute encephalopathy, anxiety.  5 admissions in 6 months; 1 emergency room visit in 6 months. Most recent admission 8/5-05/28/18 with respiratory failure.   Home visit completed. Client reports she has been doing well. She is  sitting on the sofa with oxygen on. Respirations even unlabored.  Client reports she has obtained her new nebulizer machine and found her old machine. She states she is using her old nebulizer at this time. She also reports she has obtained a new mask for her CPAP and is using her CPAP at night.  O2Sat noted to be low 79-84 percent- primarily staying around 80-81 on 2L/[place]. Increased to 86% with deep breaths. RNCM called and notified pulmonology office. Spoke with [employer]. Oxygen increased to 3l/ and saturation increased to 90%.   Pulse oximetry noted to decrease to 87-89 percent, but she is very talkative and appears to not be breathing through her nose. When [facility] asked client to not talk and breath through her nose, her O2 sat increased to 92%.  Upcoming appointment with Dr. Elsworth Soho on 07/08/18. Client reports she has transportation.   Plan: Follow up with client telephonically within 1-2 weeks. [facility] CM Care Plan Problem One     Most Recent Value  Care Plan Problem One  at risk for readmission as evidence by recent admisstion.  Role Documenting the Problem One  Care Management Coordinator  Care Plan for Problem One  Not Active  [facility] Long Term Goal   client will not be readmitted within the next 31 days.  [facility] Long Term Goal Start Date  04/05/18  [facility] Long Term Goal Met Date  06/02/18  [facility] CM Short Term Goal #1   client will verbalize contact with home health agency within the next week.  [facility] CM Short Term Goal #1 Start Date  04/05/18  [facility] CM Short Term Goal #1 Met Date  04/15/18  [facility] CM Short Term Goal #2   client will attend follow up appointments within the next 30 days.  [facility] CM Short Term Goal #2 Start Date  04/05/18  [facility] CM Short Term Goal #2 Met Date  06/02/18  [facility] CM Short Term Goal #3  client will attend follow up appointment with pulmonoloigst within the next 1-2 weeks.  [facility] CM Short Term Goal #3 Start Date  07/06/18  [facility] for Short Tern Goal #3  RNCM discussed upcoming  appointment with client, confirmed that client has transportation to her appointment.  [facility] CM Short Term Goal #4  client will verbalize receipt of nebulizer machine within the next 30 days.   [facility] CM Short Term Goal #4 Start Date  06/10/18  [facility] CM Short Term Goal #4 Met Date  07/06/18     Thea Silversmith, RN, MSN, [place] Coordinator Cell: [phone]

## 2018-07-06 NOTE — Telephone Encounter (Signed)
I have called the nurse [facility]  back she states that the patient is having Sob with activity and Stats are at 2l of o2 79 %. Even at rest.  I asked that the move her up to 3l and her stats are she is at 90%.  Denies any fever. She is coughing a little some production with yellow mucus.  She would like to know what she should do about this low o2.  Dr. Elsworth Soho please advise Thank you.

## 2018-07-06 NOTE — Telephone Encounter (Signed)
Called and spoke with patient regarding RA recommendations. Verified pt has appt with RA on 07/08/18 at 3:15pm Pt verbalized understanding and denied any questions or concerns at this time.  Nothing further needed.

## 2018-07-07 DIAGNOSIS — I2781 Cor pulmonale (chronic): Secondary | ICD-10-CM | POA: Diagnosis not present

## 2018-07-07 DIAGNOSIS — F329 Major depressive disorder, single episode, unspecified: Secondary | ICD-10-CM | POA: Diagnosis not present

## 2018-07-07 DIAGNOSIS — F419 Anxiety disorder, unspecified: Secondary | ICD-10-CM | POA: Diagnosis not present

## 2018-07-07 DIAGNOSIS — M810 Age-related osteoporosis without current pathological fracture: Secondary | ICD-10-CM | POA: Diagnosis not present

## 2018-07-07 DIAGNOSIS — G4733 Obstructive sleep apnea (adult) (pediatric): Secondary | ICD-10-CM | POA: Diagnosis not present

## 2018-07-07 DIAGNOSIS — J441 Chronic obstructive pulmonary disease with (acute) exacerbation: Secondary | ICD-10-CM | POA: Diagnosis not present

## 2018-07-07 DIAGNOSIS — I5030 Unspecified diastolic (congestive) heart failure: Secondary | ICD-10-CM | POA: Diagnosis not present

## 2018-07-07 DIAGNOSIS — J9612 Chronic respiratory failure with hypercapnia: Secondary | ICD-10-CM | POA: Diagnosis not present

## 2018-07-07 DIAGNOSIS — I110 Hypertensive heart disease with heart failure: Secondary | ICD-10-CM | POA: Diagnosis not present

## 2018-07-08 ENCOUNTER — Encounter: Payer: Self-pay | Admitting: Pulmonary Disease

## 2018-07-08 ENCOUNTER — Ambulatory Visit (INDEPENDENT_AMBULATORY_CARE_PROVIDER_SITE_OTHER): Payer: Medicare HMO | Admitting: Pulmonary Disease

## 2018-07-08 DIAGNOSIS — I2781 Cor pulmonale (chronic): Secondary | ICD-10-CM

## 2018-07-08 DIAGNOSIS — J449 Chronic obstructive pulmonary disease, unspecified: Secondary | ICD-10-CM | POA: Diagnosis not present

## 2018-07-08 DIAGNOSIS — Z23 Encounter for immunization: Secondary | ICD-10-CM

## 2018-07-08 DIAGNOSIS — J9611 Chronic respiratory failure with hypoxia: Secondary | ICD-10-CM | POA: Diagnosis not present

## 2018-07-08 NOTE — Assessment & Plan Note (Signed)
Continue Breo and duo nebs 

## 2018-07-08 NOTE — Progress Notes (Signed)
   Subjective:    Patient ID: Jasmine [name], female    DOB: [birth year], 72 y.o.   MRN: [id number]  HPI  72y.oformersmoker for FU of COPD, hypoxic and hypercarbic respiratory failure , Lung nodules  She was started on O2 in march 2014 &uses 3 liters 02 continuously and w/ sleep, and 4L pulsed with activity.  She has smoked Since her 6's - 51 Pyrs. Quit 07/21/2015. Ongoingcocaine use/polysubstance abuse   She had 5 admissions in the last 6 months and was also followed by the [facility] care management team  During one such admission, she was provided with a trilogy machine with a full facemask she has had problems using this and on her office visit we checked compliance which was very poor.  After her last hospitalization, emphasized compliance and she reports that she is using this more consistently. Her breathing is also improved, compliant with Breo and DuoNeb's to 3 times daily. Her last drug use is more than 3 months ago.  We obtained palliative care input during hospital stay and she insisted on full CODE STATUS   Significant tests/ events reviewed  CT chest 10/2011 >>showed Moderate centrilobular emphysema. Small bilateral pleural effusions with dependent atelectasis  CT chest 07/2016-resolving right upper lobe consolidation,spiculated subcentimeter nodules that have been noted before with low-grade PET hypermetabolism CT chest February 2019 stable right upper lobe scarring, stable bilateral pulmonary nodules, emphysema   PFT >FEV1 at 0.47 L( 30%), ratio 39 , No BD response (<200cc)  Review of Systems neg for any significant sore throat, dysphagia, itching, sneezing, nasal congestion or excess/ purulent secretions, fever, chills, sweats, unintended wt loss, pleuritic or exertional cp, hempoptysis, orthopnea pnd or change in chronic leg swelling. Also denies presyncope, palpitations, heartburn, abdominal pain, nausea, vomiting, diarrhea or change in bowel or urinary  habits, dysuria,hematuria, rash, arthralgias, visual complaints, headache, numbness weakness or ataxia.     Objective:   Physical Exam  Gen. Pleasant, thin, in no distress ENT - no thrush, no post nasal drip Neck: No JVD, no thyromegaly, no carotid bruits Lungs: no use of accessory muscles, no dullness to percussion,decreased without rales or rhonchi  Cardiovascular: Rhythm regular, heart sounds  normal, no murmurs or gallops, no peripheral edema Musculoskeletal: No deformities, no cyanosis or clubbing        Assessment & Plan:

## 2018-07-08 NOTE — Assessment & Plan Note (Signed)
Lasix once daily

## 2018-07-08 NOTE — Assessment & Plan Note (Signed)
Check ambulatory saturation on 2L pulse, increase if required and we will advise her on correct oxygen settings. Overall she is better off being at lower oxygen requirements since she has hypercarbia  Check download on trilogy machine Flu shot today

## 2018-07-08 NOTE — Patient Instructions (Signed)
Check ambulatory saturation on 2L pulse Congratulations on staying clean.  Check download on trilogy machine Flu shot today

## 2018-07-12 ENCOUNTER — Other Ambulatory Visit: Payer: Self-pay

## 2018-07-12 NOTE — Patient Outreach (Signed)
[facility]) Care Management  07/12/2018  Jasmine [name] February 08, 1946 [id number]  72 year old with history of, but not all inclusive: COPD, Heart failure, depression, HTN, asthma, COPD, cor [place], protein calorie malnutrition, hypothyroidism, hyperglycemia, polysubstance abuse, acute encephalopathy, anxiety.  4 admissions in 6 months; 1 emergency room visit in 6 months.Most recent admission 8/5-05/28/18 with respiratory failure.  RNCM called to follow up. Client reports she is doing well. She states she went to her pulmonologist appointment and received a good report. She reports her oxygen has been turned back down to 2l/[place] and she was checked on 2liters at her pulmonologist appointment. She states she remains on 2l/[place] without any problems.   RNCM reviewed upcoming appointments: Gynecology: 09/06/18 Pulmonology Tammy Parrett-09/07/18 Primary care [name] 09/20/18  Client denies any questions or concerns at this time. RNCM discussed transition to health coach for disease management of COPD. Client is agreeable.  RNCM reinforced that she could call the 24 hour nurse advice line as needed.  Plan: transition to health coach.  Thea Silversmith, RN, MSN, [place] Coordinator Cell: [phone]

## 2018-07-13 NOTE — Addendum Note (Signed)
Addended by: Luretha Rued on: 07/13/2018 09:20 AM   Modules accepted: Orders

## 2018-07-14 ENCOUNTER — Encounter: Payer: Self-pay | Admitting: *Deleted

## 2018-07-15 ENCOUNTER — Ambulatory Visit: Payer: Medicare HMO | Admitting: Pulmonary Disease

## 2018-07-21 ENCOUNTER — Other Ambulatory Visit: Payer: Self-pay | Admitting: Internal Medicine

## 2018-07-21 NOTE — Telephone Encounter (Signed)
Copied from [place] [id number]. Topic: Quick Communication - Other Results >> Jul 21, 2018  1:12 PM Yvette Rack wrote: Medication:  levothyroxine (SYNTHROID, LEVOTHROID) 75 MCG tablet  Preferred Pharmacy:  [facility], [address] [place] [address] Phone: [phone] Fax: [phone]

## 2018-07-25 DIAGNOSIS — J449 Chronic obstructive pulmonary disease, unspecified: Secondary | ICD-10-CM | POA: Diagnosis not present

## 2018-07-27 ENCOUNTER — Other Ambulatory Visit: Payer: BC Managed Care – PPO | Admitting: Internal Medicine

## 2018-07-27 DIAGNOSIS — I509 Heart failure, unspecified: Secondary | ICD-10-CM

## 2018-07-27 DIAGNOSIS — R0602 Shortness of breath: Secondary | ICD-10-CM

## 2018-07-27 DIAGNOSIS — Z515 Encounter for palliative care: Secondary | ICD-10-CM | POA: Diagnosis not present

## 2018-07-27 NOTE — Progress Notes (Signed)
PALLIATIVE CARE CONSULT VISIT   PATIENT NAME: Jasmine [name] DOB: 02-09-1946 MRN: [id number]  PRIMARY CARE PROVIDER:   Biagio Borg, MD  REFERRING PROVIDER:  Biagio Borg, MD [place], [place] [address]  RESPONSIBLE PARTY:   Self  RECOMMENDATIONS and PLAN:  1.  Shortness of breath  R06.02:  At baseline. Maintaining sats > 90% and weight at 113# or less.  Continuous use of O2 at 2-3L/min.  Using BiPap nightly.    .  2.  Edema due to congestive heart failure  I50.9:  Improved.  Taking Lasix daily.   Compression socks and leg elevation.  CHF management techniques reinforced.  3.  Palliative care  Encounter: Long term goals are to manage CHF and COPD.  Prevent re-hospitalizations. Cautioned against excessive use of ETOH.  Palliative care will continue to follow every 1-2 months.  I spent 45 minutes providing this consultation,  from 11:00am to 11:45am. More than 50% of the time in this consultation was spent coordinating communication with patient.   HISTORY OF PRESENT ILLNESS: Followup with Jasmine [name] finds that she has been without hospitalization since last visit.  She continues to use oxygen at 2L/min and BiPaP at night.  .  Appetite has improved and no reports of falls. She does report intake of one wine cooler daily.     CODE STATUS:  FULL CODE  PPS: 50% HOSPICE ELIGIBILITY/DIAGNOSIS: TBD  PAST MEDICAL HISTORY:  Past Medical History:  Diagnosis Date  . ALLERGIC RHINITIS 05/01/2007  . Anxiety   . Arthritis    "hands" (12/28/2015)  . ASTHMA 05/01/2007   "since I was a child"  . Cancer (HCC)    LUNG  . CHF (congestive heart failure) ([place])   . Chronic bronchitis ([place])   . COPD (chronic obstructive pulmonary disease) ([place])   . DEPRESSION 07/21/2008  . DVT (deep venous thrombosis) (HCC) 1960s   LLE  . FATIGUE 10/10/2010  . HYPERTENSION 05/01/2007  . HYPERTHYROIDISM 11/23/2007   Pt endorses having had Graves disease, possibly radioactive iodine x1,  but no thyroidectomy  . HYPOTHYROIDISM 08/23/2009  . LUMBAR RADICULOPATHY, RIGHT 08/25/2008  . On home oxygen therapy    "3-4L; qd; all the time" (12/28/2015)  . OSTEOPOROSIS 05/01/2007  . SHOULDER PAIN, LEFT 08/23/2009  . SINUSITIS, CHRONIC 10/10/2010    SOCIAL HX:  Social History   Tobacco Use  . Smoking status: Former Smoker    Packs/day: 1.00    Years: 54.00    Pack years: 54.00    Types: Cigarettes    Last attempt to quit: 07/21/2015    Years since quitting: 2.9  . Smokeless tobacco: Never Used  Substance Use Topics  . Alcohol use: Yes    Comment: 1-4 beers per month     ALLERGIES:  Allergies  Allergen Reactions  . Alendronate Sodium Other (See Comments)    Patient does not remember this reaction     PERTINENT MEDICATIONS:  Outpatient Encounter Medications as of 07/01/2018  Medication Sig  . albuterol (VENTOLIN HFA) 108 (90 Base) MCG/ACT inhaler Inhale 2 puffs into the lungs every 6 (six) hours as needed for wheezing or shortness of breath.  [name] aspirin EC 81 MG EC tablet Take 1 tablet (81 mg total) by mouth daily.  . citalopram (CELEXA) 20 MG tablet Take 1 tablet (20 mg total) by mouth daily.  . feeding supplement, ENSURE ENLIVE, (ENSURE ENLIVE) LIQD Take 237 mLs by mouth 3 (three) times  daily between meals.  . fluticasone furoate-vilanterol (BREO ELLIPTA) 200-25 MCG/INH AEPB Inhale 1 puff into the lungs daily.  . furosemide (LASIX) 40 MG tablet 1 tab by mouth in the AM daily, with 1 tab in the PM as needed for persistent swelling  . hydroxypropyl methylcellulose / hypromellose (ISOPTO TEARS / GONIOVISC) 2.5 % ophthalmic solution Place 1 drop into both eyes as needed for dry eyes.  [name] ipratropium-albuterol (DUONEB) 0.5-2.5 (3) MG/3ML SOLN Take 3 mLs by nebulization 2 (two) times daily.  [name] levothyroxine (SYNTHROID, LEVOTHROID) 75 MCG tablet TAKE 1 TABLET BY MOUTH DAILY BEFORE BREAKFAST.  [name] potassium chloride (K-DUR) 10 MEQ tablet Take 10 mEq by mouth daily.  [name] PRESCRIPTION  MEDICATION Bipap  . traMADol (ULTRAM) 50 MG tablet Take 1 tablet (50 mg total) by mouth 2 (two) times daily as needed.   No facility-administered encounter medications on file as of 07/01/2018.     PHYSICAL EXAM:   General: Well appearing elderly female.  In NAD Cardiovascular: regular rate and rhythm Pulmonary: Lungs are clear. No increased respiratory effort.  O2 via  in use Abdomen: soft, nontender, + bowel sounds Extremities: Trace edema BLE Skin: no rashes, exposed skin is intact Neurological: A&O x 3  Gonzella Lex, NP-C

## 2018-07-28 DIAGNOSIS — I2781 Cor pulmonale (chronic): Secondary | ICD-10-CM | POA: Diagnosis not present

## 2018-07-28 DIAGNOSIS — J9690 Respiratory failure, unspecified, unspecified whether with hypoxia or hypercapnia: Secondary | ICD-10-CM | POA: Diagnosis not present

## 2018-07-28 DIAGNOSIS — F418 Other specified anxiety disorders: Secondary | ICD-10-CM | POA: Diagnosis not present

## 2018-07-28 DIAGNOSIS — R7881 Bacteremia: Secondary | ICD-10-CM | POA: Diagnosis not present

## 2018-07-28 DIAGNOSIS — J9612 Chronic respiratory failure with hypercapnia: Secondary | ICD-10-CM | POA: Diagnosis not present

## 2018-07-28 DIAGNOSIS — E43 Unspecified severe protein-calorie malnutrition: Secondary | ICD-10-CM | POA: Diagnosis not present

## 2018-07-28 DIAGNOSIS — E890 Postprocedural hypothyroidism: Secondary | ICD-10-CM | POA: Diagnosis not present

## 2018-07-28 DIAGNOSIS — J449 Chronic obstructive pulmonary disease, unspecified: Secondary | ICD-10-CM | POA: Diagnosis not present

## 2018-07-28 DIAGNOSIS — B9561 Methicillin susceptible Staphylococcus aureus infection as the cause of diseases classified elsewhere: Secondary | ICD-10-CM | POA: Diagnosis not present

## 2018-08-02 DIAGNOSIS — J449 Chronic obstructive pulmonary disease, unspecified: Secondary | ICD-10-CM | POA: Diagnosis not present

## 2018-08-02 DIAGNOSIS — R7881 Bacteremia: Secondary | ICD-10-CM | POA: Diagnosis not present

## 2018-08-02 DIAGNOSIS — J9612 Chronic respiratory failure with hypercapnia: Secondary | ICD-10-CM | POA: Diagnosis not present

## 2018-08-02 DIAGNOSIS — I2781 Cor pulmonale (chronic): Secondary | ICD-10-CM | POA: Diagnosis not present

## 2018-08-02 DIAGNOSIS — F418 Other specified anxiety disorders: Secondary | ICD-10-CM | POA: Diagnosis not present

## 2018-08-02 DIAGNOSIS — B9561 Methicillin susceptible Staphylococcus aureus infection as the cause of diseases classified elsewhere: Secondary | ICD-10-CM | POA: Diagnosis not present

## 2018-08-02 DIAGNOSIS — E890 Postprocedural hypothyroidism: Secondary | ICD-10-CM | POA: Diagnosis not present

## 2018-08-02 DIAGNOSIS — E43 Unspecified severe protein-calorie malnutrition: Secondary | ICD-10-CM | POA: Diagnosis not present

## 2018-08-02 DIAGNOSIS — J9690 Respiratory failure, unspecified, unspecified whether with hypoxia or hypercapnia: Secondary | ICD-10-CM | POA: Diagnosis not present

## 2018-08-03 DIAGNOSIS — E890 Postprocedural hypothyroidism: Secondary | ICD-10-CM | POA: Diagnosis not present

## 2018-08-03 DIAGNOSIS — J449 Chronic obstructive pulmonary disease, unspecified: Secondary | ICD-10-CM | POA: Diagnosis not present

## 2018-08-03 DIAGNOSIS — I2781 Cor pulmonale (chronic): Secondary | ICD-10-CM | POA: Diagnosis not present

## 2018-08-03 DIAGNOSIS — B9561 Methicillin susceptible Staphylococcus aureus infection as the cause of diseases classified elsewhere: Secondary | ICD-10-CM | POA: Diagnosis not present

## 2018-08-03 DIAGNOSIS — J9612 Chronic respiratory failure with hypercapnia: Secondary | ICD-10-CM | POA: Diagnosis not present

## 2018-08-03 DIAGNOSIS — R7881 Bacteremia: Secondary | ICD-10-CM | POA: Diagnosis not present

## 2018-08-03 DIAGNOSIS — E43 Unspecified severe protein-calorie malnutrition: Secondary | ICD-10-CM | POA: Diagnosis not present

## 2018-08-03 DIAGNOSIS — J9690 Respiratory failure, unspecified, unspecified whether with hypoxia or hypercapnia: Secondary | ICD-10-CM | POA: Diagnosis not present

## 2018-08-03 DIAGNOSIS — F418 Other specified anxiety disorders: Secondary | ICD-10-CM | POA: Diagnosis not present

## 2018-08-05 ENCOUNTER — Other Ambulatory Visit: Payer: Self-pay | Admitting: *Deleted

## 2018-08-05 DIAGNOSIS — R911 Solitary pulmonary nodule: Secondary | ICD-10-CM | POA: Diagnosis not present

## 2018-08-05 DIAGNOSIS — J9611 Chronic respiratory failure with hypoxia: Secondary | ICD-10-CM | POA: Diagnosis not present

## 2018-08-05 DIAGNOSIS — R300 Dysuria: Secondary | ICD-10-CM | POA: Diagnosis not present

## 2018-08-05 DIAGNOSIS — J449 Chronic obstructive pulmonary disease, unspecified: Secondary | ICD-10-CM | POA: Diagnosis not present

## 2018-08-05 NOTE — Patient Outreach (Addendum)
Jasmine [name]) Care Management  08/05/2018   Jasmine [name] November 28, 1945 [id number]  RN Health Coach telephone call to patient.  [place] compliance verified. Per patient she is doing fair. Patient understands the COPD action plan and zones and have papers on the refrigerator. Patient is using inhalers as per ordered. Per patient she has a CPAP and it is leaking. Patient stated that [facility] came out yesterday and adjusted the temperature on the resmed CPAP and ordered another mask. Per patient she went to sleep holding the mask on and when she fell asleep the mask would leak. RN discussed with patient how to tightened the strap until the new mask arrived. RN discussed cleaning the machine and changing the filter. The patient did not know that the machine had a filter. Patient stated she will read the book today and speak with the therapist or call health coach if unable to figure out where the filter is located. RN Health Coach has resmed CPAP and is able to explain some basics. Patient has agreed to follow up outreach calls.  Current Medications:  Current Outpatient Medications  Medication Sig Dispense Refill  . albuterol (VENTOLIN HFA) 108 (90 Base) MCG/ACT inhaler Inhale 2 puffs into the lungs every 6 (six) hours as needed for wheezing or shortness of breath.    [name] aspirin EC 81 MG EC tablet Take 1 tablet (81 mg total) by mouth daily. 30 tablet 0  . citalopram (CELEXA) 20 MG tablet Take 1 tablet (20 mg total) by mouth daily. 90 tablet 3  . fluticasone furoate-vilanterol (BREO ELLIPTA) 200-25 MCG/INH AEPB Inhale 1 puff into the lungs daily. 1 each 5  . furosemide (LASIX) 40 MG tablet 1 tab by mouth in the AM daily, with 1 tab in the PM as needed for persistent swelling 180 tablet 3  . ipratropium-albuterol (DUONEB) 0.5-2.5 (3) MG/3ML SOLN Take 3 mLs by nebulization 2 (two) times daily. 360 mL 3  . levothyroxine (SYNTHROID, LEVOTHROID) 75 MCG tablet TAKE 1 TABLET BY MOUTH DAILY BEFORE  BREAKFAST. 90 tablet 0  . potassium chloride (K-DUR) 10 MEQ tablet Take 10 mEq by mouth daily.    [name] PRESCRIPTION MEDICATION Bipap    . traMADol (ULTRAM) 50 MG tablet Take 1 tablet (50 mg total) by mouth 2 (two) times daily as needed. 60 tablet 2   No current facility-administered medications for this visit.     Functional Status:  In your present state of health, do you have any difficulty performing the following activities: 08/05/2018 05/26/2018  Hearing? Jasmine [name]  Vision? N N  Difficulty concentrating or making decisions? Y N  Comment - -  Walking or climbing stairs? Y N  Dressing or bathing? N N  Doing errands, shopping? Y N  Preparing Food and eating ? N -  Using the Toilet? N -  In the past six months, have you accidently leaked urine? N -  Do you have problems with loss of bowel control? N -  Managing your Medications? Y -  Comment - -  Managing your Finances? N -  Housekeeping or managing your Housekeeping? Y -  Some recent data might be hidden    Fall/Depression Screening: Fall Risk  08/05/2018 06/16/2018 04/30/2018  Falls in the past year? Yes Yes Yes  Number falls in past yr: 1 1 1   Injury with Fall? - No Yes  Risk Factor Category  - - High Fall Risk  Risk for fall due to : History of fall(s);Impaired balance/gait;Impaired mobility -  History of fall(s)  Follow up Falls evaluation completed;Falls prevention discussed - Education provided;Falls prevention discussed   PHQ 2/9 Scores 08/05/2018 06/16/2018 04/30/2018 04/28/2018 02/19/2017 01/22/2017 12/23/2016  PHQ - 2 Score 0 0 2 1 0 0 0  PHQ- 9 Score - - 4 - - - -   THN CM Care Plan Problem One     Most Recent Value  Care Plan Problem One  Knowledge Deficit in Self management of COPD  Role Documenting the Problem One  [place] for Problem One  Active  [facility]N Long Term Goal   cPatient will not have any readmission for COPD within the next 90 days  [facility]N Long Term Goal Start Date  08/05/18  Interventions for Problem One  Long Term Goal  RN discussed medication adherence. RN discussed the COPD zones and action plan. RN discussed follow up appointments. RN willfollow upwith further discussion and compliance  [facility]N CM Short Term Goal #1   Patient will verbalize receiving the flu shot within the next 30 days  [facility]N CM Short Term Goal #1 Start Date  08/05/18  Interventions for Short Term Goal #1  RN discussed the importance of receiving flu shot with COPD. RN will follow up for compliance  [facility]N CM Short Term Goal #2   Patient will report having a better understanding of usage of CPAP machine within the next 30 days  [facility]N CM Short Term Goal #2 Start Date  08/05/18  Interventions for Short Term Goal #2  Patient didn't know how to tighten the strap on CPAP nasal pillows. RN Health Coach walkesd patient through the process. Patient is to follow up with [facility] respiratory team for better fitting mask. RN sent patient educational material on CPAP?BIPAP machines. RN will follow up with further discussion and teach back  [facility] CM Short Term Goal #3  Patient will verbalize following up with social worker on [facility] directive and will within the next 30 days  [facility]N CM Short Term Goal #3 Start Date  08/05/18  Interventions for Short Tern Goal #3  RN referred patient to social worker to assist with filling out the papers on [facility] directive and living will.       Assessment:  Patient needs assistance with [facility] directive and will reading Patient needs assistance in understanding CPAP/BIPAP and care Patient will benefit from Health Coach telephonic outreach for education and support for COPD self management. Plan:  RN discussed COPD zones and action plan RN discussed CPAP care RN sent Get the most out of your CPAP RN sent finding the right CPAP RN sent how to use CPAP/BIPAP RN sent getting use to your CPAP RN sent COPD exacerbation RN sent COPD and physical activity RN sent Form-COPD plan RN sent Eating Plan for COPD RN sent  assessment and barriers letter to physician RN will follow up within the month of November  Jasmine [name] Management [phone]

## 2018-08-09 ENCOUNTER — Other Ambulatory Visit: Payer: Self-pay

## 2018-08-09 NOTE — Patient Outreach (Signed)
[facility]) Care Management  08/09/2018  LINDZIE BOXX 11/29/1945 [id number]  Successful outreach to the patient on today's date, HIPAA identifiers confirmed. BSW introduced self to the patient and the reason for today's call, indicating BSW recevied a referral to assist the patient with completion of advance directives. The patient seemed confused about what documents are involved with an advance directive. BSW explained a medical power of attorney and  living will document to the patient. The patient indicated she does not need assistance with these forms. The patient reports needing assistance in completion of her will.  The patient has a scheduled appointment in December with "the credit union" to meet with a representative and complete her will. The patient reports having an advance directive document in the home and completed but admits it is not notarized. The patient requested BSW perform a home visit to notarize this document. BSW explained to the patient that unfortunately, this BSW is not a Patent examiner. BSW encouraged the patient to bring this document to her credit union to be notarized. The patient stated understanding.   At this time BSW will perform a social work discipline closure as the patient is able to identify available resources and a current plan to meet her goals. The patient is aware she will remain active with [place].  Daneen Schick, BSW, CDP [facility] [phone]

## 2018-08-24 ENCOUNTER — Other Ambulatory Visit: Payer: Self-pay

## 2018-08-24 NOTE — Patient Outreach (Signed)
[facility]) Care Management  08/24/2018  Jasmine [name] 1946-06-26 [id number]   RNCM received phone call from client. She reports that she was having problems with her CPAP during the night. However, she states that she is using it now and it is working correctly. Client states she received her CPAP from [facility]. RNCM encouraged client to call [facility] if she continues to have problems with her CPAP. RNCM also reinforced the Health Coach and Palliative care Nurse Practitioner is actively involved. RNCM encouraged client to call them with questions or concerns. Client confirms that she has both their contact numbers in her phone and will call as needed. RNCM also reinforced that if she has questions/concerns/issues and was not able to reach them that she could also call RNCM.   RNCM updateed Health Coach.  Thea Silversmith, RN, MSN, [place] Coordinator Cell: [phone]

## 2018-08-25 DIAGNOSIS — J449 Chronic obstructive pulmonary disease, unspecified: Secondary | ICD-10-CM | POA: Diagnosis not present

## 2018-08-28 DIAGNOSIS — E890 Postprocedural hypothyroidism: Secondary | ICD-10-CM | POA: Diagnosis not present

## 2018-08-28 DIAGNOSIS — I2781 Cor pulmonale (chronic): Secondary | ICD-10-CM | POA: Diagnosis not present

## 2018-08-28 DIAGNOSIS — F418 Other specified anxiety disorders: Secondary | ICD-10-CM | POA: Diagnosis not present

## 2018-08-28 DIAGNOSIS — B9561 Methicillin susceptible Staphylococcus aureus infection as the cause of diseases classified elsewhere: Secondary | ICD-10-CM | POA: Diagnosis not present

## 2018-08-28 DIAGNOSIS — R7881 Bacteremia: Secondary | ICD-10-CM | POA: Diagnosis not present

## 2018-08-28 DIAGNOSIS — J449 Chronic obstructive pulmonary disease, unspecified: Secondary | ICD-10-CM | POA: Diagnosis not present

## 2018-08-28 DIAGNOSIS — J9612 Chronic respiratory failure with hypercapnia: Secondary | ICD-10-CM | POA: Diagnosis not present

## 2018-08-28 DIAGNOSIS — E43 Unspecified severe protein-calorie malnutrition: Secondary | ICD-10-CM | POA: Diagnosis not present

## 2018-08-28 DIAGNOSIS — J9690 Respiratory failure, unspecified, unspecified whether with hypoxia or hypercapnia: Secondary | ICD-10-CM | POA: Diagnosis not present

## 2018-09-02 DIAGNOSIS — I2781 Cor pulmonale (chronic): Secondary | ICD-10-CM | POA: Diagnosis not present

## 2018-09-02 DIAGNOSIS — E43 Unspecified severe protein-calorie malnutrition: Secondary | ICD-10-CM | POA: Diagnosis not present

## 2018-09-02 DIAGNOSIS — F418 Other specified anxiety disorders: Secondary | ICD-10-CM | POA: Diagnosis not present

## 2018-09-02 DIAGNOSIS — J9690 Respiratory failure, unspecified, unspecified whether with hypoxia or hypercapnia: Secondary | ICD-10-CM | POA: Diagnosis not present

## 2018-09-02 DIAGNOSIS — R7881 Bacteremia: Secondary | ICD-10-CM | POA: Diagnosis not present

## 2018-09-02 DIAGNOSIS — E890 Postprocedural hypothyroidism: Secondary | ICD-10-CM | POA: Diagnosis not present

## 2018-09-02 DIAGNOSIS — J9612 Chronic respiratory failure with hypercapnia: Secondary | ICD-10-CM | POA: Diagnosis not present

## 2018-09-02 DIAGNOSIS — J449 Chronic obstructive pulmonary disease, unspecified: Secondary | ICD-10-CM | POA: Diagnosis not present

## 2018-09-02 DIAGNOSIS — B9561 Methicillin susceptible Staphylococcus aureus infection as the cause of diseases classified elsewhere: Secondary | ICD-10-CM | POA: Diagnosis not present

## 2018-09-03 ENCOUNTER — Ambulatory Visit: Payer: Self-pay | Admitting: *Deleted

## 2018-09-05 DIAGNOSIS — R300 Dysuria: Secondary | ICD-10-CM | POA: Diagnosis not present

## 2018-09-05 DIAGNOSIS — J9611 Chronic respiratory failure with hypoxia: Secondary | ICD-10-CM | POA: Diagnosis not present

## 2018-09-05 DIAGNOSIS — R911 Solitary pulmonary nodule: Secondary | ICD-10-CM | POA: Diagnosis not present

## 2018-09-05 DIAGNOSIS — J449 Chronic obstructive pulmonary disease, unspecified: Secondary | ICD-10-CM | POA: Diagnosis not present

## 2018-09-06 ENCOUNTER — Other Ambulatory Visit: Payer: Self-pay

## 2018-09-06 ENCOUNTER — Encounter: Payer: Self-pay | Admitting: Obstetrics and Gynecology

## 2018-09-06 ENCOUNTER — Ambulatory Visit (INDEPENDENT_AMBULATORY_CARE_PROVIDER_SITE_OTHER): Payer: Medicare HMO | Admitting: Obstetrics and Gynecology

## 2018-09-06 VITALS — BP 132/76 | HR 108 | Ht 60.0 in | Wt 114.2 lb

## 2018-09-06 DIAGNOSIS — Z113 Encounter for screening for infections with a predominantly sexual mode of transmission: Secondary | ICD-10-CM | POA: Diagnosis not present

## 2018-09-06 DIAGNOSIS — N952 Postmenopausal atrophic vaginitis: Secondary | ICD-10-CM

## 2018-09-06 DIAGNOSIS — Z01419 Encounter for gynecological examination (general) (routine) without abnormal findings: Secondary | ICD-10-CM | POA: Diagnosis not present

## 2018-09-06 DIAGNOSIS — N941 Unspecified dyspareunia: Secondary | ICD-10-CM | POA: Diagnosis not present

## 2018-09-06 NOTE — Patient Instructions (Signed)
EXERCISE AND DIET:  We recommended that you start or continue a regular exercise program for good health. Regular exercise means any activity that makes your heart beat faster and makes you sweat.  We recommend exercising at least 30 minutes per day at least 3 days a week, preferably 4 or 5.  We also recommend a diet low in fat and sugar.  Inactivity, poor dietary choices and obesity can cause diabetes, heart attack, stroke, and kidney damage, among others.    ALCOHOL AND SMOKING:  Women should limit their alcohol intake to no more than 7 drinks/beers/glasses of wine (combined, not each!) per week. Moderation of alcohol intake to this level decreases your risk of breast cancer and liver damage. And of course, no recreational drugs are part of a healthy lifestyle.  And absolutely no smoking or even second hand smoke. Most people know smoking can cause heart and lung diseases, but did you know it also contributes to weakening of your bones? Aging of your skin?  Yellowing of your teeth and nails?  CALCIUM AND VITAMIN D:  Adequate intake of calcium and Vitamin D are recommended.  The recommendations for exact amounts of these supplements seem to change often, but generally speaking 1,200 mg of calcium (either carbonate or citrate) and 800 units of Vitamin D per day seems prudent. Certain women may benefit from higher intake of Vitamin D.  If you are among these women, your doctor will have told you during your visit.    PAP SMEARS:  Pap smears, to check for cervical cancer or precancers,  have traditionally been done yearly, although recent scientific advances have shown that most women can have pap smears less often.  However, every woman still should have a physical exam from her gynecologist every year. It will include a breast check, inspection of the vulva and vagina to check for abnormal growths or skin changes, a visual exam of the cervix, and then an exam to evaluate the size and shape of the uterus and  ovaries.  And after 73 years of age, a rectal exam is indicated to check for rectal cancers. We will also provide age appropriate advice regarding health maintenance, like when you should have certain vaccines, screening for sexually transmitted diseases, bone density testing, colonoscopy, mammograms, etc.   MAMMOGRAMS:  All women over 40 years old should have a yearly mammogram. Many facilities now offer a "3D" mammogram, which may cost around $50 extra out of pocket. If possible,  we recommend you accept the option to have the 3D mammogram performed.  It both reduces the number of women who will be called back for extra views which then turn out to be normal, and it is better than the routine mammogram at detecting truly abnormal areas.    COLONOSCOPY:  Colonoscopy to screen for colon cancer is recommended for all women at age 50.  We know, you hate the idea of the prep.  We agree, BUT, having colon cancer and not knowing it is worse!!  Colon cancer so often starts as a polyp that can be seen and removed at colonscopy, which can quite literally save your life!  And if your first colonoscopy is normal and you have no family history of colon cancer, most women don't have to have it again for 10 years.  Once every ten years, you can do something that may end up saving your life, right?  We will be happy to help you get it scheduled when you are ready.    Be sure to check your insurance coverage so you understand how much it will cost.  It may be covered as a preventative service at no cost, but you should check your particular policy.    Mammogram Facilities  Yearly screening mammograms are recommended for women beginning at age 51. For a routine screening mammogram, you may schedule the appointment and have it done at the location of your choice.  Please ask the facility to send the results to our office. (fax [phone]) Location options include:  [facility] [place], [place], [place] [address] [phone]  [facility] [address], [place], [place] [address] [phone]   Breast Self-Awareness Breast self-awareness means being familiar with how your breasts look and feel. It involves checking your breasts regularly and reporting any changes to your health care provider. Practicing breast self-awareness is important. A change in your breasts can be a sign of a serious medical problem. Being familiar with how your breasts look and feel allows you to find any problems early, when treatment is more likely to be successful. All women should practice breast self-awareness, including women who have had breast implants. How to do a breast self-exam One way to learn what is normal for your breasts and whether your breasts are changing is to do a breast self-exam. To do a breast self-exam: Look for Changes  1. Remove all the clothing above your waist. 2. Stand in front of a mirror in a room with good lighting. 3. Put your hands on your hips. 4. Push your hands firmly downward. 5. Compare your breasts in the mirror. Look for differences between them (asymmetry), such as: ? Differences in shape. ? Differences in size. ? Puckers, dips, and bumps in one breast and not the other. 6. Look at each breast for changes in your skin, such as: ? Redness. ? Scaly areas. 7. Look for changes in your nipples, such as: ? Discharge. ? Bleeding. ? Dimpling. ? Redness. ? A change in position. Feel for Changes  Carefully feel your breasts for lumps and changes. It is best to do this while lying on your back on the floor and again while sitting or standing in the shower or tub with soapy water on your skin. Feel each breast in the following way:  Place the arm on the side of the breast you are examining above your head.  Feel your breast with the other hand.  Start in the nipple area and make  inch (2 cm) overlapping circles to  feel your breast. Use the pads of your three middle fingers to do this. Apply light pressure, then medium pressure, then firm pressure. The light pressure will allow you to feel the tissue closest to the skin. The medium pressure will allow you to feel the tissue that is a little deeper. The firm pressure will allow you to feel the tissue close to the ribs.  Continue the overlapping circles, moving downward over the breast until you feel your ribs below your breast.  Move one finger-width toward the center of the body. Continue to use the  inch (2 cm) overlapping circles to feel your breast as you move slowly up toward your collarbone.  Continue the up and down exam using all three pressures until you reach your armpit.  Write Down What You Find  Write down what is normal for each breast and any changes that you find. Keep a written record with  breast changes or normal findings for each breast. By writing this information down, you do not need to depend only on memory for size, tenderness, or location. Write down where you are in your menstrual cycle, if you are still menstruating. If you are having trouble noticing differences in your breasts, do not get discouraged. With time you will become more familiar with the variations in your breasts and more comfortable with the exam. How often should I examine my breasts? Examine your breasts every month. If you are breastfeeding, the best time to examine your breasts is after a feeding or after using a breast pump. If you menstruate, the best time to examine your breasts is 5-7 days after your period is over. During your period, your breasts are lumpier, and it may be more difficult to notice changes. When should I see my health care provider? See your health care provider if you notice:  A change in shape or size of your breasts or nipples.  A change in the skin of your breast or nipples, such as a reddened or scaly area.  Unusual discharge from  your nipples.  A lump or thick area that was not there before.  Pain in your breasts.  Anything that concerns you.  This information is not intended to replace advice given to you by your health care provider. Make sure you discuss any questions you have with your health care provider. Document Released: 10/06/2005 Document Revised: 03/13/2016 Document Reviewed: 08/26/2015 Elsevier Interactive Patient Education  [employer].

## 2018-09-06 NOTE — Progress Notes (Signed)
72 y.o. G51P0050 Widowed Black or African American Not Hispanic or Latino female here for annual exam.   She has COPD, on O2 No vaginal bleeding. She is back with her first husband, live together. Sexually active, partner says he is hitting a block. Some discomfort for her, she doesn't open enough to allow him to penetrate. They have tried lubrication. She is worried he might be having sex with someone else.     No LMP recorded. Patient is postmenopausal.          Sexually active: Yes.    The current method of family planning is post menopausal status.    Exercising: No.  The patient does not participate in regular exercise at present. Smoker:  no  Health Maintenance: Pap:  01-16-16 WNL  History of abnormal Pap:  no MMG:  05-16-15 WNL Colonoscopy:  07/20/2003 WNL BMD:  07/20/2003 TDaP:  2010 Gardasil: N/A   reports that she quit smoking about 3 years ago. Her smoking use included cigarettes. She has a 54.00 pack-year smoking history. She has never used smokeless tobacco. She reports that she drinks about 2.0 standard drinks of alcohol per week. She reports that she has current or past drug history. No longer smoking crack.   Past Medical History:  Diagnosis Date  . ALLERGIC RHINITIS 05/01/2007  . Anxiety   . Arthritis    "hands" (12/28/2015)  . ASTHMA 05/01/2007   "since I was a child"  . Cancer (HCC)    LUNG  . CHF (congestive heart failure) ([place])   . Chronic bronchitis ([place])   . COPD (chronic obstructive pulmonary disease) ([place])   . DEPRESSION 07/21/2008  . DVT (deep venous thrombosis) (HCC) 1960s   LLE  . FATIGUE 10/10/2010  . HYPERTENSION 05/01/2007  . HYPERTHYROIDISM 11/23/2007   Pt endorses having had Graves disease, possibly radioactive iodine x1, but no thyroidectomy  . HYPOTHYROIDISM 08/23/2009  . LUMBAR RADICULOPATHY, RIGHT 08/25/2008  . On home oxygen therapy    "3-4L; qd; all the time" (12/28/2015)  . OSTEOPOROSIS 05/01/2007  . SHOULDER PAIN, LEFT 08/23/2009  . SINUSITIS,  CHRONIC 10/10/2010    Past Surgical History:  Procedure Laterality Date  . APPENDECTOMY    . DILATION AND CURETTAGE OF UTERUS  multiple   history of multiple dialations and curettages and miscarriages, unfortunately never carrying a child to term  . ECTOPIC PREGNANCY SURGERY  "late '60s or early '70s  . ELECTROCARDIOGRAM  06/20/2006  . OOPHORECTOMY Right     Current Outpatient Medications  Medication Sig Dispense Refill  . albuterol (VENTOLIN HFA) 108 (90 Base) MCG/ACT inhaler Inhale 2 puffs into the lungs every 6 (six) hours as needed for wheezing or shortness of breath.    [name] aspirin EC 81 MG EC tablet Take 1 tablet (81 mg total) by mouth daily. 30 tablet 0  . citalopram (CELEXA) 20 MG tablet Take 1 tablet (20 mg total) by mouth daily. 90 tablet 3  . fluticasone furoate-vilanterol (BREO ELLIPTA) 200-25 MCG/INH AEPB Inhale 1 puff into the lungs daily. 1 each 5  . furosemide (LASIX) 40 MG tablet 1 tab by mouth in the AM daily, with 1 tab in the PM as needed for persistent swelling 180 tablet 3  . ipratropium-albuterol (DUONEB) 0.5-2.5 (3) MG/3ML SOLN Take 3 mLs by nebulization 2 (two) times daily. 360 mL 3  . levothyroxine (SYNTHROID, LEVOTHROID) 75 MCG tablet TAKE 1 TABLET BY MOUTH DAILY BEFORE BREAKFAST. 90 tablet 0  . potassium chloride (K-DUR) 10 MEQ tablet  Take 10 mEq by mouth daily.    [name] PRESCRIPTION MEDICATION Bipap    . traMADol (ULTRAM) 50 MG tablet Take 1 tablet (50 mg total) by mouth 2 (two) times daily as needed. 60 tablet 2   No current facility-administered medications for this visit.     Family History  Problem Relation Age of Onset  . Lung cancer Father   . Alcohol abuse Brother   . Diabetes Brother   . Hypertension Other   . Stroke Mother   . Thyroid disease Mother   . Asthma Unknown        maternal aunts    Review of Systems  Constitutional: Negative.   HENT: Positive for hearing loss and sinus pressure.   Eyes: Negative.   Respiratory: Negative.    Cardiovascular: Positive for chest pain and leg swelling.  Gastrointestinal: Negative.   Endocrine: Negative.   Genitourinary: Negative.   Musculoskeletal: Positive for joint swelling.  Skin: Positive for rash.       itching  Allergic/Immunologic: Negative.   Neurological: Negative.   Hematological: Negative.   Psychiatric/Behavioral: Negative.     Exam:   BP 132/76 (BP Location: Right Arm, Patient Position: Sitting, Cuff Size: Normal)   Pulse (!) 108   Ht 5' (1.524 m)   Wt 114 lb 3.2 oz (51.8 kg)   BMI 22.30 kg/m   Weight change: @WEIGHTCHANGE @ Height:   Height: 5' (152.4 cm)  Ht Readings from Last 3 Encounters:  09/06/18 5' (1.524 m)  07/08/18 5' (1.524 m)  07/06/18 5' (1.524 m)    General appearance: alert, cooperative and appears stated age Head: Normocephalic, without obvious abnormality, atraumatic Neck: no adenopathy, supple, symmetrical, trachea midline and thyroid normal to inspection and palpation Lungs: clear to auscultation bilaterally, decreased breath sounds bilaterally  Cardiovascular: regular rate and rhythm Breasts: normal appearance, no masses or tenderness Abdomen: soft, non-tender; non distended,  no masses,  no organomegaly Extremities: extremities normal, atraumatic, no cyanosis or edema Skin: Skin color, texture, turgor normal. No rashes or lesions Lymph nodes: Cervical, supraclavicular, and axillary nodes normal. No abnormal inguinal nodes palpated Neurologic: Grossly normal   Pelvic: External genitalia:  no lesions              Urethra:  normal appearing urethra with no masses, tenderness or lesions              Bartholins and Skenes: normal                 Vagina: very atrophic appearing vagina with normal color and discharge, no lesions. Tight, only able to insert one finger comfortably.               Cervix: no lesions               Bimanual Exam:  Uterus:  normal size, contour, position, consistency, mobility, non-tender               Adnexa: no mass, fullness, tenderness               Rectovaginal: Confirms               Anus:  normal sphincter tone, no lesions  Chaperone was present for exam.  A:  Well Woman with normal exam  Vaginal atrophy, dyspareunia, unable to have intercourse  P:   Discussed breast self exam  Discussed calcium and vit D intake  Screening STD  No pap this year  Mammogram over due, #'s given  She will discuss colon cancer screening with her primary  We discussed the option of vaginal estrogen, she has so many medical issues, we discussed the risks.

## 2018-09-07 ENCOUNTER — Ambulatory Visit: Payer: Medicare HMO | Admitting: Adult Health

## 2018-09-07 ENCOUNTER — Encounter: Payer: Self-pay | Admitting: Adult Health

## 2018-09-07 ENCOUNTER — Ambulatory Visit (INDEPENDENT_AMBULATORY_CARE_PROVIDER_SITE_OTHER): Payer: Medicare HMO | Admitting: Adult Health

## 2018-09-07 ENCOUNTER — Other Ambulatory Visit: Payer: Self-pay | Admitting: *Deleted

## 2018-09-07 DIAGNOSIS — J449 Chronic obstructive pulmonary disease, unspecified: Secondary | ICD-10-CM | POA: Diagnosis not present

## 2018-09-07 DIAGNOSIS — I5032 Chronic diastolic (congestive) heart failure: Secondary | ICD-10-CM | POA: Diagnosis not present

## 2018-09-07 DIAGNOSIS — J9612 Chronic respiratory failure with hypercapnia: Secondary | ICD-10-CM

## 2018-09-07 LAB — CHLAMYDIA/GONOCOCCUS/TRICHOMONAS, NAA
CHLAMYDIA BY NAA: NEGATIVE
GONOCOCCUS BY NAA: NEGATIVE
TRICH VAG BY NAA: NEGATIVE

## 2018-09-07 LAB — HEPATITIS C ANTIBODY: Hep C Virus Ab: <0.1 s/co ratio (ref 0.0–0.9)

## 2018-09-07 LAB — HEP, RPR, HIV PANEL
HIV Screen 4th Generation wRfx: NONREACTIVE
Hepatitis B Surface Ag: NEGATIVE
RPR: NONREACTIVE

## 2018-09-07 NOTE — Assessment & Plan Note (Signed)
Severe COPD currently well controlled on present regimen with Breo and Spiriva.  She seems to be doing better since beginning trilogy at bedtime  Plan  Patient Instructions  Continue on Spiriva 1 puff daily  Continue on BREO daily  Rinse after use.  May use Duoneb nebs every 6hr As needed  Wheezing /shortness of breath .  Continue on lasix 40mg  daily .  Continue on non-invasive Vent (TRILOGY )  At bedtime  And with naps.  Continue on Oxygen -2l/m . [name]   Follow up Dr. Elsworth Soho  In 2-3- months and  As needed    Please contact office for sooner follow up if symptoms do not improve or worsen or seek emergency care

## 2018-09-07 NOTE — Assessment & Plan Note (Signed)
Hypercarbic and hypoxic respiratory failure.  She is doing well on nocturnal trilogy. Continue on oxygen 2 L on continuous flow.  Advised to keep O2 saturations greater than 88 to 90%.

## 2018-09-07 NOTE — Progress Notes (Signed)
@Patient  ID: Jasmine [name], female    DOB: 07/30/1946, 72 y.o.   MRN: [id number]  Chief Complaint  Patient presents with  . Follow-up    COPD     Referring provider: Biagio Borg, MD  HPI: HPI: [id number].oformersmoker for FU of COPD, hypoxic and hypercarbic respiratory failure , Lung nodules .Seen for initial pulmonary eval 02/03/13  She was started on O2 in march 2014 &uses 3 liters 02 continuously and w/ sleep, and 4L pulsed with activity.  She has smoked Since her 55's - 45 Pyrs. Quit 07/21/2015. Ongoingcocaine use/polysubstance abuse Husband passed away Jan 18, 2016 while she was hospitalized.  TEST/Events  CT chest 10/2011 >>showed Moderate centrilobular emphysema. Small bilateral pleural effusions with dependent atelectasis  CT chest 07/2016-resolving right upper lobe consolidation,spiculated subcentimeter nodules that have been noted before with low-grade PET hypermetabolism CT chest February 2019 stable right upper lobe scarring, stable bilateral pulmonary nodules, emphysema   PFT >FEV1 at 0.47 L( 30%), ratio 39 , No BD response (<200cc)  2014 completed Pulm Rehab  09/07/2018 Follow up : COPD , hypercarbic and hypoxic respiratory failure on oxygen Patient presents for a 10-month follow-up.  Patient has underlying very severe COPD that is oxygen dependent.  She has hypercarbic and hypoxic respiratory failure on oxygen.  She is on nocturnal noninvasive vent support/TRILOG Y.  She says he she has been doing better since starting this therapy.  She denies any flare of cough or wheezing.  Says shortness of breath is at baseline.  Gets winded with minimal activity.  She remains on Spiriva and Breo.  Has DuoNeb nebulizers that she uses as needed  He has diastolic heart failure is on Lasix 40 mg daily.  Denies any increased leg swelling.  She remains on oxygen 2 L.  Says that this has been working well for her.  She was previously on 3 L.  However says that she is been  able to keep her O2 saturations greater than 90% on 2 L.  Today in office on pulsing oxygen on 2 L she does drop her oxygen levels.  Advised her to increase her O2 to continuous flow or use higher pulsing oxygen to keep O2 saturations greater than 90%.  Flu shot is up-to-date  Patient has polysubstance abuse history says that she has been clean for several months now..  Patient is followed at home with palliative care team  Allergies  Allergen Reactions  . Alendronate Sodium Other (See Comments)    Patient does not remember this reaction    Immunization History  Administered Date(s) Administered  . Influenza Split 07/10/2011, 08/18/2012  . Influenza Whole 07/21/2008, 08/23/2009, 10/10/2010  . Influenza, High Dose Seasonal PF 07/10/2016, 09/03/2017  . Influenza,inj,Quad PF,6+ Mos 08/12/2013, 07/10/2014, 07/08/2018  . Pneumococcal Conjugate-13 11/30/2014  . Pneumococcal Polysaccharide-23 07/21/2007, 01/12/2013, 06/16/2018  . Td 08/23/2009  . Zoster 07/20/2006    Past Medical History:  Diagnosis Date  . ALLERGIC RHINITIS 05/01/2007  . Anxiety   . Arthritis    "hands" (12/28/2015)  . ASTHMA 05/01/2007   "since I was a child"  . Cancer (HCC)    LUNG  . CHF (congestive heart failure) ([place])   . Chronic bronchitis ([place])   . COPD (chronic obstructive pulmonary disease) ([place])   . DEPRESSION 07/21/2008  . DVT (deep venous thrombosis) (HCC) 1960s   LLE  . FATIGUE 10/10/2010  . HYPERTENSION 05/01/2007  . HYPERTHYROIDISM 11/23/2007   Pt endorses having had Graves disease, possibly radioactive iodine x1, but  no thyroidectomy  . HYPOTHYROIDISM 08/23/2009  . LUMBAR RADICULOPATHY, RIGHT 08/25/2008  . On home oxygen therapy    "3-4L; qd; all the time" (12/28/2015)  . OSTEOPOROSIS 05/01/2007  . SHOULDER PAIN, LEFT 08/23/2009  . SINUSITIS, CHRONIC 10/10/2010    Tobacco History: Social History   Tobacco Use  Smoking Status Former Smoker  . Packs/day: 1.00  . Years: 54.00  . Pack years:  54.00  . Types: Cigarettes  . Last attempt to quit: 07/21/2015  . Years since quitting: 3.1  Smokeless Tobacco Never Used   Counseling given: Not Answered   Outpatient Medications Prior to Visit  Medication Sig Dispense Refill  . albuterol (VENTOLIN HFA) 108 (90 Base) MCG/ACT inhaler Inhale 2 puffs into the lungs every 6 (six) hours as needed for wheezing or shortness of breath.    [name] aspirin EC 81 MG EC tablet Take 1 tablet (81 mg total) by mouth daily. 30 tablet 0  . citalopram (CELEXA) 20 MG tablet Take 1 tablet (20 mg total) by mouth daily. 90 tablet 3  . fluticasone furoate-vilanterol (BREO ELLIPTA) 200-25 MCG/INH AEPB Inhale 1 puff into the lungs daily. 1 each 5  . furosemide (LASIX) 40 MG tablet 1 tab by mouth in the AM daily, with 1 tab in the PM as needed for persistent swelling 180 tablet 3  . ipratropium-albuterol (DUONEB) 0.5-2.5 (3) MG/3ML SOLN Take 3 mLs by nebulization 2 (two) times daily. 360 mL 3  . levothyroxine (SYNTHROID, LEVOTHROID) 75 MCG tablet TAKE 1 TABLET BY MOUTH DAILY BEFORE BREAKFAST. 90 tablet 0  . potassium chloride (K-DUR) 10 MEQ tablet Take 10 mEq by mouth daily.    [name] PRESCRIPTION MEDICATION Bipap    . traMADol (ULTRAM) 50 MG tablet Take 1 tablet (50 mg total) by mouth 2 (two) times daily as needed. 60 tablet 2   No facility-administered medications prior to visit.      Review of Systems  Constitutional:   No  weight loss, night sweats,  Fevers, chills, + fatigue, or  lassitude.  HEENT:   No headaches,  Difficulty swallowing,  Tooth/dental problems, or  Sore throat,                No sneezing, itching, ear ache, nasal congestion, post nasal drip,   CV:  No chest pain,  Orthopnea, PND, swelling in lower extremities, anasarca, dizziness, palpitations, syncope.   GI  No heartburn, indigestion, abdominal pain, nausea, vomiting, diarrhea, change in bowel habits, loss of appetite, bloody stools.   Resp: .  No chest wall deformity  Skin: no rash or  lesions.  GU: no dysuria, change in color of urine, no urgency or frequency.  No flank pain, no hematuria   MS:  No joint pain or swelling.  No decreased range of motion.  No back pain.    Physical Exam  BP 100/60   Pulse 84   Ht 5' 0.25" (1.53 m)   Wt 115 lb (52.2 kg)   SpO2 94%   BMI 22.27 kg/m   GEN: A/Ox3; pleasant , NAD, thin frail on oxygen   HEENT:  [place]/AT,  EACs-clear, TMs-wnl, NOSE-clear, THROAT-clear, no lesions, no postnasal drip or exudate noted.   NECK:  Supple w/ fair ROM; no JVD; normal carotid impulses w/o bruits; no thyromegaly or nodules palpated; no lymphadenopathy.    RESP diminished breath sounds in the bases no accessory muscle use, no dullness to percussion  CARD:  RRR, no m/r/g, no peripheral edema, pulses intact, no cyanosis  or clubbing.  GI:   Soft & nt; nml bowel sounds; no organomegaly or masses detected.   Musco: Warm bil, no deformities or joint swelling noted.   Neuro: alert, no focal deficits noted.    Skin: Warm, no lesions or rashes    Lab Results:  CBC    Component Value Date/Time   WBC 5.3 06/16/2018 1348   RBC 4.07 06/16/2018 1348   HGB 12.7 06/16/2018 1348   HGB 14.1 08/06/2016 1404   HCT 39.4 06/16/2018 1348   HCT 33.9 (L) 03/28/2018 0336   HCT 44.6 08/06/2016 1404   PLT 158.0 06/16/2018 1348   PLT 217 08/06/2016 1404   MCV 96.7 06/16/2018 1348   MCV 92.3 08/06/2016 1404   MCH 30.9 05/26/2018 0335   MCHC 32.4 06/16/2018 1348   RDW 15.0 06/16/2018 1348   RDW 16.3 (H) 08/06/2016 1404   LYMPHSABS 1.3 06/16/2018 1348   LYMPHSABS 1.7 08/06/2016 1404   MONOABS 0.7 06/16/2018 1348   MONOABS 0.5 08/06/2016 1404   EOSABS 0.2 06/16/2018 1348   EOSABS 0.4 08/06/2016 1404   BASOSABS 0.0 06/16/2018 1348   BASOSABS 0.0 08/06/2016 1404    BMET    Component Value Date/Time   NA 138 06/16/2018 1348   NA 142 08/06/2016 1405   K 4.3 06/16/2018 1348   K 3.6 08/06/2016 1405   CL 91 (L) 06/16/2018 1348   CO2 42 (H) 06/16/2018  1348   CO2 35 (H) 08/06/2016 1405   GLUCOSE 119 (H) 06/16/2018 1348   GLUCOSE 93 08/06/2016 1405   BUN 10 06/16/2018 1348   BUN 18.6 08/06/2016 1405   CREATININE 0.67 06/16/2018 1348   CREATININE 0.8 08/06/2016 1405   CALCIUM 9.8 06/16/2018 1348   CALCIUM 10.1 08/06/2016 1405   GFRNONAA >60 05/26/2018 0335   GFRAA >60 05/26/2018 0335    BNP    Component Value Date/Time   BNP 17.9 05/24/2018 0840    ProBNP    Component Value Date/Time   PROBNP 43.0 02/03/2013 1651    Imaging: No results found.    No flowsheet data found.  No results found for: NITRICOXIDE      Assessment & Plan:   COPD (chronic obstructive pulmonary disease) (HCC) Severe COPD currently well controlled on present regimen with Breo and Spiriva.  She seems to be doing better since beginning trilogy at bedtime  Plan  Patient Instructions  Continue on Spiriva 1 puff daily  Continue on BREO daily  Rinse after use.  May use Duoneb nebs every 6hr As needed  Wheezing /shortness of breath .  Continue on lasix 40mg  daily .  Continue on non-invasive Vent (TRILOGY )  At bedtime  And with naps.  Continue on Oxygen -2l/m . [name]   Follow up Dr. Elsworth Soho  In 2-3- months and  As needed    Please contact office for sooner follow up if symptoms do not improve or worsen or seek emergency care                Chronic diastolic congestive heart failure ([place]) Appears compensated On current regimen  Respiratory failure with hypercapnia ([place]) Hypercarbic and hypoxic respiratory failure.  She is doing well on nocturnal trilogy. Continue on oxygen 2 L on continuous flow.  Advised to keep O2 saturations greater than 88 to 90%.      Rexene Edison, NP 09/07/2018

## 2018-09-07 NOTE — Assessment & Plan Note (Signed)
Appears compensated On current regimen

## 2018-09-07 NOTE — Addendum Note (Signed)
Addended by: Parke Poisson E on: 09/07/2018 04:34 PM   Modules accepted: Orders

## 2018-09-07 NOTE — Patient Instructions (Addendum)
Continue on Spiriva 1 puff daily  Continue on BREO daily  Rinse after use.  May use Duoneb nebs every 6hr As needed  Wheezing /shortness of breath .  Continue on lasix 40mg  daily .  Continue on non-invasive Vent (TRILOGY )  At bedtime  And with naps.  Continue on Oxygen -2l/m . [name]   Follow up Dr. Elsworth Soho  In 2-3- months and  As needed    Please contact office for sooner follow up if symptoms do not improve or worsen or seek emergency care

## 2018-09-07 NOTE — Patient Outreach (Signed)
[facility]) Care Management  09/07/2018  INDA MCGLOTHEN 06/03/1946 [id number]   RN Health Coach Monthly Outreach   Outreach Attempt:  Outreach attempt #1 to patient for monthly follow up. Patient answered and stated she was at a doctors appointment, requested a telephone call back.  Plan:  RN Health Coach will schedule another telephone outreach within the month of November.   [place] [phone] [email or web address]

## 2018-09-09 ENCOUNTER — Other Ambulatory Visit: Payer: BC Managed Care – PPO | Admitting: Internal Medicine

## 2018-09-13 ENCOUNTER — Other Ambulatory Visit: Payer: Self-pay | Admitting: *Deleted

## 2018-09-13 NOTE — Patient Outreach (Signed)
[facility]) Care Management  09/13/2018  Jasmine [name] 10/30/1945 [id number]   RN Health Coach attempted #2 follow up outreach call to patient.  Patient was unavailable. HIPPA compliance voicemail message left with return callback number.  Plan: RN will call patient again within 14 days.  [place] Care Management [phone]

## 2018-09-15 ENCOUNTER — Encounter: Payer: Self-pay | Admitting: *Deleted

## 2018-09-15 NOTE — Telephone Encounter (Signed)
This encounter was created in error - please disregard.

## 2018-09-20 ENCOUNTER — Encounter: Payer: Self-pay | Admitting: Internal Medicine

## 2018-09-20 ENCOUNTER — Other Ambulatory Visit (INDEPENDENT_AMBULATORY_CARE_PROVIDER_SITE_OTHER): Payer: Medicare HMO

## 2018-09-20 ENCOUNTER — Ambulatory Visit (INDEPENDENT_AMBULATORY_CARE_PROVIDER_SITE_OTHER): Payer: Medicare HMO | Admitting: Internal Medicine

## 2018-09-20 ENCOUNTER — Other Ambulatory Visit: Payer: Self-pay | Admitting: Internal Medicine

## 2018-09-20 VITALS — BP 126/74 | HR 100 | Temp 98.2°F | Ht 60.25 in | Wt 115.0 lb

## 2018-09-20 DIAGNOSIS — I509 Heart failure, unspecified: Secondary | ICD-10-CM

## 2018-09-20 DIAGNOSIS — Z7189 Other specified counseling: Secondary | ICD-10-CM | POA: Insufficient documentation

## 2018-09-20 DIAGNOSIS — R739 Hyperglycemia, unspecified: Secondary | ICD-10-CM

## 2018-09-20 DIAGNOSIS — Z0000 Encounter for general adult medical examination without abnormal findings: Secondary | ICD-10-CM

## 2018-09-20 LAB — HEPATIC FUNCTION PANEL
ALT: 12 U/L (ref 0–35)
AST: 22 U/L (ref 0–37)
Albumin: 4.4 g/dL (ref 3.5–5.2)
Alkaline Phosphatase: 68 U/L (ref 39–117)
Bilirubin, Direct: 0.1 mg/dL (ref 0.0–0.3)
Total Bilirubin: 0.5 mg/dL (ref 0.2–1.2)
Total Protein: 7.7 g/dL (ref 6.0–8.3)

## 2018-09-20 LAB — CBC WITH DIFFERENTIAL/PLATELET
Basophils Absolute: 0.1 10*3/uL (ref 0.0–0.1)
Basophils Relative: 1.4 % (ref 0.0–3.0)
Eosinophils Absolute: 0.2 10*3/uL (ref 0.0–0.7)
Eosinophils Relative: 4.1 % (ref 0.0–5.0)
HCT: 43.6 % (ref 36.0–46.0)
Hemoglobin: 14.1 g/dL (ref 12.0–15.0)
Lymphocytes Relative: 27.9 % (ref 12.0–46.0)
Lymphs Abs: 1.4 10*3/uL (ref 0.7–4.0)
MCHC: 32.4 g/dL (ref 30.0–36.0)
MCV: 93.1 fl (ref 78.0–100.0)
Monocytes Absolute: 0.5 10*3/uL (ref 0.1–1.0)
Monocytes Relative: 9.5 % (ref 3.0–12.0)
Neutro Abs: 2.8 10*3/uL (ref 1.4–7.7)
Neutrophils Relative %: 57.1 % (ref 43.0–77.0)
Platelets: 211 10*3/uL (ref 150.0–400.0)
RBC: 4.68 Mil/uL (ref 3.87–5.11)
RDW: 16.5 % — ABNORMAL HIGH (ref 11.5–15.5)
WBC: 4.9 10*3/uL (ref 4.0–10.5)

## 2018-09-20 LAB — BASIC METABOLIC PANEL
BUN: 10 mg/dL (ref 6–23)
CO2: 37 mEq/L — ABNORMAL HIGH (ref 19–32)
Calcium: 9.6 mg/dL (ref 8.4–10.5)
Chloride: 92 mEq/L — ABNORMAL LOW (ref 96–112)
Creatinine, Ser: 0.76 mg/dL (ref 0.40–1.20)
GFR: 95.71 mL/min (ref >60.00)
Glucose, Bld: 109 mg/dL — ABNORMAL HIGH (ref 70–99)
Potassium: 3.9 mEq/L (ref 3.5–5.1)
Sodium: 138 mEq/L (ref 135–145)

## 2018-09-20 LAB — URINALYSIS, ROUTINE W REFLEX MICROSCOPIC
Bilirubin Urine: NEGATIVE
Hgb urine dipstick: NEGATIVE
Ketones, ur: NEGATIVE
Leukocytes, UA: NEGATIVE
Nitrite: NEGATIVE
RBC / HPF: NONE SEEN (ref 0–?)
Specific Gravity, Urine: 1.01 (ref 1.000–1.030)
Total Protein, Urine: 30 — AB
UROBILINOGEN UA: 0.2 (ref 0.0–1.0)
Urine Glucose: NEGATIVE
pH: 7 (ref 5.0–8.0)

## 2018-09-20 LAB — LIPID PANEL
Cholesterol: 250 mg/dL — ABNORMAL HIGH (ref 0–200)
HDL: 98.6 mg/dL (ref >39.00)
LDL Cholesterol: 139 mg/dL — ABNORMAL HIGH (ref 0–99)
NonHDL: 151.4
Total CHOL/HDL Ratio: 3
Triglycerides: 62 mg/dL (ref 0.0–149.0)
VLDL: 12.4 mg/dL (ref 0.0–40.0)

## 2018-09-20 LAB — TSH: TSH: 1.11 u[IU]/mL (ref 0.35–4.50)

## 2018-09-20 LAB — HEMOGLOBIN A1C: Hgb A1c MFr Bld: 6.1 % (ref 4.6–6.5)

## 2018-09-20 MED ORDER — ATORVASTATIN CALCIUM 10 MG PO TABS: 10.0000 mg | ORAL_TABLET | Freq: Every day | ORAL | 3 refills | Status: DC

## 2018-09-20 NOTE — Progress Notes (Signed)
Subjective:    Patient ID: Jasmine [name], female    DOB: 11-08-1945, 72 y.o.   MRN: [id number]  HPI  Here for wellness and f/u;  Overall doing ok;  Pt denies Chest pain, worsening SOB, DOE, wheezing, orthopnea, PND, worsening LE edema, palpitations, dizziness or syncope.  Pt denies neurological change such as new headache, facial or extremity weakness.  Pt denies polydipsia, polyuria, or low sugar symptoms. Pt states overall good compliance with treatment and medications, good tolerability, and has been trying to follow appropriate diet.  Pt denies worsening depressive symptoms, suicidal ideation or panic. No fever, night sweats, wt loss, loss of appetite, or other constitutional symptoms.  Pt states good ability with ADL's, has low fall risk, home safety reviewed and adequate, no other significant changes in hearing or vision   Pt reqeusts colonoscopy, now 2 yrs after her husband died, and she is getting over that enough to consider having the colonoscopy.  Last was 2004 with Dr Deatra Ina.   Wt Readings from Last 3 Encounters:  09/20/18 115 lb (52.2 kg)  09/07/18 115 lb (52.2 kg)  09/06/18 114 lb 3.2 oz (51.8 kg)  pt states good VPAP compliance.  No new complaints Past Medical History:  Diagnosis Date  . ALLERGIC RHINITIS 05/01/2007  . Anxiety   . Arthritis    "hands" (12/28/2015)  . ASTHMA 05/01/2007   "since I was a child"  . Cancer (HCC)    LUNG  . CHF (congestive heart failure) ([place])   . Chronic bronchitis ([place])   . COPD (chronic obstructive pulmonary disease) ([place])   . DEPRESSION 07/21/2008  . DVT (deep venous thrombosis) (HCC) 1960s   LLE  . FATIGUE 10/10/2010  . HYPERTENSION 05/01/2007  . HYPERTHYROIDISM 11/23/2007   Pt endorses having had Graves disease, possibly radioactive iodine x1, but no thyroidectomy  . HYPOTHYROIDISM 08/23/2009  . LUMBAR RADICULOPATHY, RIGHT 08/25/2008  . On home oxygen therapy    "3-4L; qd; all the time" (12/28/2015)  . OSTEOPOROSIS 05/01/2007  . SHOULDER  PAIN, LEFT 08/23/2009  . SINUSITIS, CHRONIC 10/10/2010   Past Surgical History:  Procedure Laterality Date  . APPENDECTOMY    . DILATION AND CURETTAGE OF UTERUS  multiple   history of multiple dialations and curettages and miscarriages, unfortunately never carrying a child to term  . ECTOPIC PREGNANCY SURGERY  "late '60s or early '70s  . ELECTROCARDIOGRAM  06/20/2006  . OOPHORECTOMY Right     reports that she quit smoking about 3 years ago. Her smoking use included cigarettes. She has a 54.00 pack-year smoking history. She has never used smokeless tobacco. She reports that she drinks about 2.0 standard drinks of alcohol per week. She reports that she has current or past drug history. family history includes Alcohol abuse in her brother; Asthma in her unknown relative; Diabetes in her brother; Hypertension in her other; Lung cancer in her father; Stroke in her mother; Thyroid disease in her mother. Allergies  Allergen Reactions  . Alendronate Sodium Other (See Comments)    Patient does not remember this reaction   Current Outpatient Medications on File Prior to Visit  Medication Sig Dispense Refill  . albuterol (VENTOLIN HFA) 108 (90 Base) MCG/ACT inhaler Inhale 2 puffs into the lungs every 6 (six) hours as needed for wheezing or shortness of breath.    [name] aspirin EC 81 MG EC tablet Take 1 tablet (81 mg total) by mouth daily. 30 tablet 0  . citalopram (CELEXA) 20 MG tablet Take 1  tablet (20 mg total) by mouth daily. 90 tablet 3  . fluticasone furoate-vilanterol (BREO ELLIPTA) 200-25 MCG/INH AEPB Inhale 1 puff into the lungs daily. 1 each 5  . furosemide (LASIX) 40 MG tablet 1 tab by mouth in the AM daily, with 1 tab in the PM as needed for persistent swelling 180 tablet 3  . ipratropium-albuterol (DUONEB) 0.5-2.5 (3) MG/3ML SOLN Take 3 mLs by nebulization every 6 (six) hours as needed.    [name] levothyroxine (SYNTHROID, LEVOTHROID) 75 MCG tablet TAKE 1 TABLET BY MOUTH DAILY BEFORE BREAKFAST. 90  tablet 0  . potassium chloride (K-DUR) 10 MEQ tablet Take 10 mEq by mouth daily.    [name] PRESCRIPTION MEDICATION Bipap    . tiotropium (SPIRIVA) 18 MCG inhalation capsule Place 18 mcg into inhaler and inhale daily.    . traMADol (ULTRAM) 50 MG tablet Take 1 tablet (50 mg total) by mouth 2 (two) times daily as needed. 60 tablet 2   No current facility-administered medications on file prior to visit.    Review of Systems  Constitutional: Negative for other unusual diaphoresis or sweats HENT: Negative for ear discharge or swelling Eyes: Negative for other worsening visual disturbances Respiratory: Negative for stridor or other swelling  Gastrointestinal: Negative for worsening distension or other blood Genitourinary: Negative for retention or other urinary change Musculoskeletal: Negative for other MSK pain or swelling Skin: Negative for color change or other new lesions Neurological: Negative for worsening tremors and other numbness  Psychiatric/Behavioral: Negative for worsening agitation or other fatigue All other system neg per pt    Objective:   Physical Exam BP 126/74   Pulse 100   Temp 98.2 F (36.8 C) (Oral)   Ht 5' 0.25" (1.53 m)   Wt 115 lb (52.2 kg)   SpO2 (!) 86%   BMI 22.27 kg/m  VS noted,  Constitutional: Pt is oriented to person, place, and time. Appears well-developed and well-nourished, in no significant distress and comfortable Head: Normocephalic and atraumatic  Eyes: Conjunctivae and EOM are normal. Pupils are equal, round, and reactive to light Right Ear: External ear normal without discharge Left Ear: External ear normal without discharge Nose: Nose without discharge or deformity Mouth/Throat: Oropharynx is without other ulcerations and moist  Neck: Normal range of motion. Neck supple. No JVD present. No tracheal deviation present or significant neck LA or mass Cardiovascular: Normal rate, regular rhythm, normal heart sounds and intact distal pulses.     Pulmonary/Chest: WOB normal and breath sounds decreased without rales or wheezing  Abdominal: Soft. Bowel sounds are normal. NT. No HSM  Musculoskeletal: Normal range of motion. Lymphadenopathy: Has no other cervical adenopathy.  Neurological: Pt is alert and oriented to person, place, and time. Pt has normal reflexes. No cranial nerve deficit. Motor grossly intact, Gait intact Skin: Skin is warm and dry. No rash noted or new ulcerations Psychiatric:  Has normal mood and affect. Behavior is normal without agitation Trace left > right leg edema - suspect post DVT related  Lab Results  Component Value Date   WBC 5.3 06/16/2018   HGB 12.7 06/16/2018   HCT 39.4 06/16/2018   PLT 158.0 06/16/2018   GLUCOSE 119 (H) 06/16/2018   CHOL 223 (H) 06/03/2017   TRIG 50.0 06/03/2017   HDL 104.10 06/03/2017   LDLDIRECT 102.1 10/10/2010   LDLCALC 109 (H) 06/03/2017   ALT 10 05/24/2018   AST 17 05/24/2018   NA 138 06/16/2018   K 4.3 06/16/2018   CL 91 (L) 06/16/2018  CREATININE 0.67 06/16/2018   BUN 10 06/16/2018   CO2 42 (H) 06/16/2018   TSH 0.378 03/25/2018   INR 1.58 (H) 03/25/2016   HGBA1C 5.7 (H) 03/29/2018      Assessment & Plan:

## 2018-09-20 NOTE — Patient Instructions (Addendum)
You will be contacted regarding the referral for: colonoscopy, and mammogram  Please consider compression stocking use to the legs  Please continue all other medications as before, and refills have been done if requested.  Please have the pharmacy call with any other refills you may need.  Please continue your efforts at being more active, low cholesterol diet, and weight control.  You are otherwise up to date with prevention measures today.  Please keep your appointments with your specialists as you may have planned  Please go to the LAB in the Basement (turn left off the elevator) for the tests to be done today  You will be contacted by phone if any changes need to be made immediately.  Otherwise, you will receive a letter about your results with an explanation, but please check with MyChart first.  Please remember to sign up for MyChart if you have not done so, as this will be important to you in the future with finding out test results, communicating by private email, and scheduling acute appointments online when needed.  Please return in 6 months, or sooner if needed, with Lab testing done 3-5 days before4

## 2018-09-20 NOTE — Assessment & Plan Note (Signed)

## 2018-09-20 NOTE — Assessment & Plan Note (Signed)
Suspect euvolemic with some venous insufficiency, for compression stockings

## 2018-09-20 NOTE — Assessment & Plan Note (Signed)
stable overall by history and exam, recent data reviewed with pt, and pt to continue medical treatment as before,  to f/u any worsening symptoms or concerns  

## 2018-09-21 ENCOUNTER — Telehealth: Payer: Self-pay

## 2018-09-21 NOTE — Telephone Encounter (Signed)
-----   Message from Biagio Borg, MD sent at 09/20/2018  5:32 PM EST ----- Letter sent, cont same tx except  The test results show that your current treatment is OK, as the tests are stable, except the LDL cholesterol is  Moderately elevated.  Please follow a lower cholesterol diet, as well as start lipitor 10 mg per day, to reduce the LDL cholesterol and your risk of heart disease and stroke.    [place] to please inform pt, I will do rx

## 2018-09-21 NOTE — Telephone Encounter (Signed)
Pt has been informed of results and expressed understanding.  °

## 2018-09-22 ENCOUNTER — Ambulatory Visit: Payer: Self-pay

## 2018-09-22 MED ORDER — POTASSIUM CHLORIDE ER 10 MEQ PO TBCR: 10.0000 meq | EXTENDED_RELEASE_TABLET | Freq: Every day | ORAL | 3 refills | Status: DC

## 2018-09-22 NOTE — Addendum Note (Signed)
Addended by: Biagio Borg on: 09/22/2018 05:03 PM   Modules accepted: Orders

## 2018-09-22 NOTE — Telephone Encounter (Addendum)
Pt called asking if her provider wants her to take Potassium. She asked if they can be taken together. Per chart review the potassium on her medication list is listed as "historical med" End date for the potassium is 03/29/2018. Pt stated that she is taking it every day. Pt stated that if she is to continue taking it, the potassium will need to be reordered. Please advise pt. Last potassium was 3.9 on 09/20/18.  Reason for Disposition . Pharmacy calling with prescription questions and triager unable to answer question  Answer Assessment - Initial Assessment Questions 1. SYMPTOMS: "Do you have any symptoms?"     Pt is not having symptoms 2. SEVERITY: If symptoms are present, ask "Are they mild, moderate or severe?"     n/a  Protocols used: MEDICATION QUESTION CALL-A-AH

## 2018-09-22 NOTE — Telephone Encounter (Signed)
Routing to dr john, please advise, thanks 

## 2018-09-22 NOTE — Telephone Encounter (Signed)
Potassium rx done

## 2018-09-23 NOTE — Telephone Encounter (Signed)
Left message advising patient of dr Judi Cong note/instructions

## 2018-09-24 DIAGNOSIS — J449 Chronic obstructive pulmonary disease, unspecified: Secondary | ICD-10-CM | POA: Diagnosis not present

## 2018-09-27 DIAGNOSIS — J9690 Respiratory failure, unspecified, unspecified whether with hypoxia or hypercapnia: Secondary | ICD-10-CM | POA: Diagnosis not present

## 2018-09-27 DIAGNOSIS — E43 Unspecified severe protein-calorie malnutrition: Secondary | ICD-10-CM | POA: Diagnosis not present

## 2018-09-27 DIAGNOSIS — J449 Chronic obstructive pulmonary disease, unspecified: Secondary | ICD-10-CM | POA: Diagnosis not present

## 2018-09-27 DIAGNOSIS — E890 Postprocedural hypothyroidism: Secondary | ICD-10-CM | POA: Diagnosis not present

## 2018-09-27 DIAGNOSIS — F418 Other specified anxiety disorders: Secondary | ICD-10-CM | POA: Diagnosis not present

## 2018-09-27 DIAGNOSIS — J9612 Chronic respiratory failure with hypercapnia: Secondary | ICD-10-CM | POA: Diagnosis not present

## 2018-09-27 DIAGNOSIS — I2781 Cor pulmonale (chronic): Secondary | ICD-10-CM | POA: Diagnosis not present

## 2018-09-27 DIAGNOSIS — B9561 Methicillin susceptible Staphylococcus aureus infection as the cause of diseases classified elsewhere: Secondary | ICD-10-CM | POA: Diagnosis not present

## 2018-09-27 DIAGNOSIS — R7881 Bacteremia: Secondary | ICD-10-CM | POA: Diagnosis not present

## 2018-10-02 DIAGNOSIS — I2781 Cor pulmonale (chronic): Secondary | ICD-10-CM | POA: Diagnosis not present

## 2018-10-02 DIAGNOSIS — J9612 Chronic respiratory failure with hypercapnia: Secondary | ICD-10-CM | POA: Diagnosis not present

## 2018-10-02 DIAGNOSIS — F418 Other specified anxiety disorders: Secondary | ICD-10-CM | POA: Diagnosis not present

## 2018-10-02 DIAGNOSIS — J449 Chronic obstructive pulmonary disease, unspecified: Secondary | ICD-10-CM | POA: Diagnosis not present

## 2018-10-02 DIAGNOSIS — R7881 Bacteremia: Secondary | ICD-10-CM | POA: Diagnosis not present

## 2018-10-02 DIAGNOSIS — E890 Postprocedural hypothyroidism: Secondary | ICD-10-CM | POA: Diagnosis not present

## 2018-10-02 DIAGNOSIS — B9561 Methicillin susceptible Staphylococcus aureus infection as the cause of diseases classified elsewhere: Secondary | ICD-10-CM | POA: Diagnosis not present

## 2018-10-02 DIAGNOSIS — E43 Unspecified severe protein-calorie malnutrition: Secondary | ICD-10-CM | POA: Diagnosis not present

## 2018-10-02 DIAGNOSIS — J9690 Respiratory failure, unspecified, unspecified whether with hypoxia or hypercapnia: Secondary | ICD-10-CM | POA: Diagnosis not present

## 2018-10-05 DIAGNOSIS — J9611 Chronic respiratory failure with hypoxia: Secondary | ICD-10-CM | POA: Diagnosis not present

## 2018-10-05 DIAGNOSIS — R911 Solitary pulmonary nodule: Secondary | ICD-10-CM | POA: Diagnosis not present

## 2018-10-05 DIAGNOSIS — J449 Chronic obstructive pulmonary disease, unspecified: Secondary | ICD-10-CM | POA: Diagnosis not present

## 2018-10-05 DIAGNOSIS — R300 Dysuria: Secondary | ICD-10-CM | POA: Diagnosis not present

## 2018-10-21 ENCOUNTER — Other Ambulatory Visit: Payer: Self-pay

## 2018-10-21 DIAGNOSIS — R911 Solitary pulmonary nodule: Secondary | ICD-10-CM

## 2018-10-25 DIAGNOSIS — J449 Chronic obstructive pulmonary disease, unspecified: Secondary | ICD-10-CM | POA: Diagnosis not present

## 2018-10-26 ENCOUNTER — Encounter: Payer: Self-pay | Admitting: Gastroenterology

## 2018-10-28 ENCOUNTER — Other Ambulatory Visit: Payer: Self-pay | Admitting: Internal Medicine

## 2018-10-28 DIAGNOSIS — E43 Unspecified severe protein-calorie malnutrition: Secondary | ICD-10-CM | POA: Diagnosis not present

## 2018-10-28 DIAGNOSIS — J9612 Chronic respiratory failure with hypercapnia: Secondary | ICD-10-CM | POA: Diagnosis not present

## 2018-10-28 DIAGNOSIS — B9561 Methicillin susceptible Staphylococcus aureus infection as the cause of diseases classified elsewhere: Secondary | ICD-10-CM | POA: Diagnosis not present

## 2018-10-28 DIAGNOSIS — I2781 Cor pulmonale (chronic): Secondary | ICD-10-CM | POA: Diagnosis not present

## 2018-10-28 DIAGNOSIS — J9690 Respiratory failure, unspecified, unspecified whether with hypoxia or hypercapnia: Secondary | ICD-10-CM | POA: Diagnosis not present

## 2018-10-28 DIAGNOSIS — J449 Chronic obstructive pulmonary disease, unspecified: Secondary | ICD-10-CM | POA: Diagnosis not present

## 2018-10-28 DIAGNOSIS — E890 Postprocedural hypothyroidism: Secondary | ICD-10-CM | POA: Diagnosis not present

## 2018-10-28 DIAGNOSIS — R7881 Bacteremia: Secondary | ICD-10-CM | POA: Diagnosis not present

## 2018-10-28 DIAGNOSIS — F418 Other specified anxiety disorders: Secondary | ICD-10-CM | POA: Diagnosis not present

## 2018-11-01 ENCOUNTER — Ambulatory Visit
Admission: RE | Admit: 2018-11-01 | Discharge: 2018-11-01 | Disposition: A | Discharge status: Home / Self Care | Payer: Medicare HMO | Source: Ambulatory Visit | Attending: Internal Medicine | Admitting: Internal Medicine

## 2018-11-01 DIAGNOSIS — Z1231 Encounter for screening mammogram for malignant neoplasm of breast: Secondary | ICD-10-CM | POA: Diagnosis not present

## 2018-11-01 DIAGNOSIS — Z0000 Encounter for general adult medical examination without abnormal findings: Secondary | ICD-10-CM

## 2018-11-02 DIAGNOSIS — E890 Postprocedural hypothyroidism: Secondary | ICD-10-CM | POA: Diagnosis not present

## 2018-11-02 DIAGNOSIS — E43 Unspecified severe protein-calorie malnutrition: Secondary | ICD-10-CM | POA: Diagnosis not present

## 2018-11-02 DIAGNOSIS — J9612 Chronic respiratory failure with hypercapnia: Secondary | ICD-10-CM | POA: Diagnosis not present

## 2018-11-02 DIAGNOSIS — R7881 Bacteremia: Secondary | ICD-10-CM | POA: Diagnosis not present

## 2018-11-02 DIAGNOSIS — B9561 Methicillin susceptible Staphylococcus aureus infection as the cause of diseases classified elsewhere: Secondary | ICD-10-CM | POA: Diagnosis not present

## 2018-11-02 DIAGNOSIS — I2781 Cor pulmonale (chronic): Secondary | ICD-10-CM | POA: Diagnosis not present

## 2018-11-02 DIAGNOSIS — F418 Other specified anxiety disorders: Secondary | ICD-10-CM | POA: Diagnosis not present

## 2018-11-02 DIAGNOSIS — J9690 Respiratory failure, unspecified, unspecified whether with hypoxia or hypercapnia: Secondary | ICD-10-CM | POA: Diagnosis not present

## 2018-11-02 DIAGNOSIS — J449 Chronic obstructive pulmonary disease, unspecified: Secondary | ICD-10-CM | POA: Diagnosis not present

## 2018-11-05 DIAGNOSIS — J449 Chronic obstructive pulmonary disease, unspecified: Secondary | ICD-10-CM | POA: Diagnosis not present

## 2018-11-05 DIAGNOSIS — R911 Solitary pulmonary nodule: Secondary | ICD-10-CM | POA: Diagnosis not present

## 2018-11-05 DIAGNOSIS — J9611 Chronic respiratory failure with hypoxia: Secondary | ICD-10-CM | POA: Diagnosis not present

## 2018-11-05 DIAGNOSIS — R300 Dysuria: Secondary | ICD-10-CM | POA: Diagnosis not present

## 2018-11-08 ENCOUNTER — Telehealth: Payer: Self-pay

## 2018-11-08 NOTE — Telephone Encounter (Signed)
Called pt to ask if she was on O2 at home. Pt states she is on O2 ,3 liters continuously! Pt was advised she would need to schedule an OV to evaluate for a colonoscopy. We will cancel her PV on 11/16/18 and her colon on 11/30/18. Pt understood.  She will call back in the morning to schedule an OV after she talks to her family. Gwyndolyn Saxon

## 2018-11-09 ENCOUNTER — Other Ambulatory Visit: Payer: BC Managed Care – PPO | Admitting: Internal Medicine

## 2018-11-09 ENCOUNTER — Ambulatory Visit: Payer: Medicare HMO | Admitting: Pulmonary Disease

## 2018-11-09 NOTE — Progress Notes (Unsigned)
[facility] Telephone: [phone] Fax: [phone]  PATIENT NAME: Jasmine [name] DOB: [birth year] MRN: [id number]  PRIMARY CARE PROVIDER:   Biagio Borg, MD  REFERRING PROVIDER:  Biagio Borg, MD [address] [place], [place] [address]  RESPONSIBLE PARTY:      [place] VISIT   PATIENT NAME: Jasmine [name] DOB: 07-29-1946 MRN: [id number]  PRIMARY CARE PROVIDER:   Biagio Borg, MD  REFERRING PROVIDER:  Biagio Borg, MD [place], [place] [address]  RESPONSIBLE PARTY:   Self  RECOMMENDATIONS and PLAN:  1.  Shortness of breath  R06.02:  At baseline. Maintaining sats > 90% and weight at 116#.  Continuous use of O2 3L/min.  Using BiPap nightly.    .  2.  Edema due to congestive heart failure  I50.9:  Minimal.  Taking Lasix 40mg  daily.  She will use 2nd dose of Lasix as needed for increased edema.  Compression socks and leg elevation.  CHF management techniques reinforced.  3.  Palliative care  Encounter: Pt has completed her healthcare power of attorney and living will since last visit. She has selected CPR with intubation(for a max of 30 days if necessary), IV fluids and antibiotic therapy if indicated and trial of tube feedings.     I spent 60 minutes providing this consultation,  from 11:00am to 11:45am. More than 50% of the time in this consultation was spent coordinating communication with patient.   HISTORY OF PRESENT ILLNESS: Followup with [name] finds that she has been without hospitalization since last visit.  She continues to use oxygen at 2L/min and BiPaP at night.  .  Appetite has improved and no reports of falls. She does report intake of one wine cooler daily.     CODE STATUS:  FULL CODE  PPS: 50% HOSPICE ELIGIBILITY/DIAGNOSIS: TBD  PAST MEDICAL HISTORY:  Past Medical History:  Diagnosis Date  . ALLERGIC RHINITIS 05/01/2007  . Anxiety   . Arthritis    "hands" (12/28/2015)  . ASTHMA 05/01/2007   "since I was a child"  . Cancer (HCC)    LUNG  . CHF (congestive heart failure) ([place])   . Chronic bronchitis ([place])   . COPD (chronic obstructive pulmonary disease) ([place])   . DEPRESSION 07/21/2008  . DVT (deep venous thrombosis) (HCC) 1960s   LLE  . FATIGUE 10/10/2010  . HYPERTENSION 05/01/2007  . HYPERTHYROIDISM 11/23/2007   Pt endorses having had Graves disease, possibly radioactive iodine x1, but no thyroidectomy  . HYPOTHYROIDISM 08/23/2009  . LUMBAR RADICULOPATHY, RIGHT 08/25/2008  . On home oxygen therapy    "3-4L; qd; all the time" (12/28/2015)  . OSTEOPOROSIS 05/01/2007  . SHOULDER PAIN, LEFT 08/23/2009  . SINUSITIS, CHRONIC 10/10/2010    SOCIAL HX:  Social History   Tobacco Use  . Smoking status: Former Smoker    Packs/day: 1.00    Years: 54.00    Pack years: 54.00    Types: Cigarettes    Last attempt to quit: 07/21/2015    Years since quitting: 2.9  . Smokeless tobacco: Never Used  Substance Use Topics  . Alcohol use: Yes    Comment: 1-4 beers per month     ALLERGIES:  Allergies  Allergen Reactions  . Alendronate Sodium Other (See Comments)    Patient does not remember this reaction     PERTINENT MEDICATIONS:  Outpatient Encounter Medications as of 07/01/2018  Medication Sig  . albuterol (  VENTOLIN HFA) 108 (90 Base) MCG/ACT inhaler Inhale 2 puffs into the lungs every 6 (six) hours as needed for wheezing or shortness of breath.  [name] aspirin EC 81 MG EC tablet Take 1 tablet (81 mg total) by mouth daily.  . citalopram (CELEXA) 20 MG tablet Take 1 tablet (20 mg total) by mouth daily.  . feeding supplement, ENSURE ENLIVE, (ENSURE ENLIVE) LIQD Take 237 mLs by mouth 3 (three) times daily between meals.  . fluticasone furoate-vilanterol (BREO ELLIPTA) 200-25 MCG/INH AEPB Inhale 1 puff into the lungs daily.  . furosemide (LASIX) 40 MG tablet 1 tab by mouth in the AM daily, with 1 tab in the PM as needed for persistent swelling  . hydroxypropyl methylcellulose / hypromellose  (ISOPTO TEARS / GONIOVISC) 2.5 % ophthalmic solution Place 1 drop into both eyes as needed for dry eyes.  [name] ipratropium-albuterol (DUONEB) 0.5-2.5 (3) MG/3ML SOLN Take 3 mLs by nebulization 2 (two) times daily.  [name] levothyroxine (SYNTHROID, LEVOTHROID) 75 MCG tablet TAKE 1 TABLET BY MOUTH DAILY BEFORE BREAKFAST.  [name] potassium chloride (K-DUR) 10 MEQ tablet Take 10 mEq by mouth daily.  [name] PRESCRIPTION MEDICATION Bipap  . traMADol (ULTRAM) 50 MG tablet Take 1 tablet (50 mg total) by mouth 2 (two) times daily as needed.   No facility-administered encounter medications on file as of 07/01/2018.     PHYSICAL EXAM:   General: Well appearing elderly female.  In NAD Cardiovascular: regular rate and rhythm Pulmonary: Lungs are clear. No increased respiratory effort.  O2 via [place] in use Abdomen: soft, nontender, + bowel sounds Extremities: Trace edema BLE Skin: no rashes, exposed skin is intact Neurological: A&O x 3  Gonzella Lex, NP-C   ASSESSMENT:        RECOMMENDATIONS and PLAN:  1.  I spent *** minutes providing this consultation,  from *** to ***. More than 50% of the time in this consultation was spent coordinating communication.   HISTORY OF PRESENT ILLNESS:  Jasmine [name] is a 73 y.o. year old female with multiple medical problems including ***. Palliative Care was asked to help address goals of care.   CODE STATUS:   PPS: 0% HOSPICE ELIGIBILITY/DIAGNOSIS: TBD  PAST MEDICAL HISTORY:  Past Medical History:  Diagnosis Date  . ALLERGIC RHINITIS 05/01/2007  . Anxiety   . Arthritis    "hands" (12/28/2015)  . ASTHMA 05/01/2007   "since I was a child"  . Cancer (HCC)    LUNG  . CHF (congestive heart failure) ([place])   . Chronic bronchitis ([place])   . COPD (chronic obstructive pulmonary disease) ([place])   . DEPRESSION 07/21/2008  . DVT (deep venous thrombosis) (HCC) 1960s   LLE  . FATIGUE 10/10/2010  . HYPERTENSION 05/01/2007  . HYPERTHYROIDISM 11/23/2007   Pt endorses having had  Graves disease, possibly radioactive iodine x1, but no thyroidectomy  . HYPOTHYROIDISM 08/23/2009  . LUMBAR RADICULOPATHY, RIGHT 08/25/2008  . On home oxygen therapy    "3-4L; qd; all the time" (12/28/2015)  . OSTEOPOROSIS 05/01/2007  . SHOULDER PAIN, LEFT 08/23/2009  . SINUSITIS, CHRONIC 10/10/2010    SOCIAL HX:  Social History   Tobacco Use  . Smoking status: Former Smoker    Packs/day: 1.00    Years: 54.00    Pack years: 54.00    Types: Cigarettes    Last attempt to quit: 07/21/2015    Years since quitting: 3.3  . Smokeless tobacco: Never Used  Substance Use Topics  . Alcohol use: Yes  Alcohol/week: 2.0 standard drinks    Types: 2 Standard drinks or equivalent per week    ALLERGIES:  Allergies  Allergen Reactions  . Alendronate Sodium Other (See Comments)    Patient does not remember this reaction     PERTINENT MEDICATIONS:  Outpatient Encounter Medications as of 11/09/2018  Medication Sig  . albuterol (VENTOLIN HFA) 108 (90 Base) MCG/ACT inhaler Inhale 2 puffs into the lungs every 6 (six) hours as needed for wheezing or shortness of breath.  [name] aspirin EC 81 MG EC tablet Take 1 tablet (81 mg total) by mouth daily.  [name] atorvastatin (LIPITOR) 10 MG tablet Take 1 tablet (10 mg total) by mouth daily.  . citalopram (CELEXA) 20 MG tablet Take 1 tablet (20 mg total) by mouth daily.  . fluticasone furoate-vilanterol (BREO ELLIPTA) 200-25 MCG/INH AEPB Inhale 1 puff into the lungs daily.  . furosemide (LASIX) 40 MG tablet 1 tab by mouth in the AM daily, with 1 tab in the PM as needed for persistent swelling  . ipratropium-albuterol (DUONEB) 0.5-2.5 (3) MG/3ML SOLN Take 3 mLs by nebulization every 6 (six) hours as needed.  [name] levothyroxine (SYNTHROID, LEVOTHROID) 75 MCG tablet TAKE 1 TABLET BY MOUTH DAILY BEFORE BREAKFAST.  [name] potassium chloride (K-DUR) 10 MEQ tablet Take 1 tablet (10 mEq total) by mouth daily.  [name] PRESCRIPTION MEDICATION Bipap  . tiotropium (SPIRIVA) 18 MCG inhalation  capsule Place 18 mcg into inhaler and inhale daily.  . traMADol (ULTRAM) 50 MG tablet Take 1 tablet (50 mg total) by mouth 2 (two) times daily as needed.   No facility-administered encounter medications on file as of 11/09/2018.     PHYSICAL EXAM:   General: NAD, frail appearing, thin Cardiovascular: regular rate and rhythm Pulmonary: clear ant fields Abdomen: soft, nontender, + bowel sounds GU: no suprapubic tenderness Extremities: no edema, no joint deformities Skin: no rashes Neurological: Weakness but otherwise nonfocal  Gonzella Lex, NP

## 2018-11-10 DIAGNOSIS — E43 Unspecified severe protein-calorie malnutrition: Secondary | ICD-10-CM | POA: Diagnosis not present

## 2018-11-10 DIAGNOSIS — E890 Postprocedural hypothyroidism: Secondary | ICD-10-CM | POA: Diagnosis not present

## 2018-11-10 DIAGNOSIS — J9612 Chronic respiratory failure with hypercapnia: Secondary | ICD-10-CM | POA: Diagnosis not present

## 2018-11-10 DIAGNOSIS — B9561 Methicillin susceptible Staphylococcus aureus infection as the cause of diseases classified elsewhere: Secondary | ICD-10-CM | POA: Diagnosis not present

## 2018-11-10 DIAGNOSIS — J449 Chronic obstructive pulmonary disease, unspecified: Secondary | ICD-10-CM | POA: Diagnosis not present

## 2018-11-10 DIAGNOSIS — J9690 Respiratory failure, unspecified, unspecified whether with hypoxia or hypercapnia: Secondary | ICD-10-CM | POA: Diagnosis not present

## 2018-11-10 DIAGNOSIS — F418 Other specified anxiety disorders: Secondary | ICD-10-CM | POA: Diagnosis not present

## 2018-11-10 DIAGNOSIS — I2781 Cor pulmonale (chronic): Secondary | ICD-10-CM | POA: Diagnosis not present

## 2018-11-10 DIAGNOSIS — R7881 Bacteremia: Secondary | ICD-10-CM | POA: Diagnosis not present

## 2018-11-16 ENCOUNTER — Encounter: Payer: Self-pay | Admitting: Physician Assistant

## 2018-11-16 ENCOUNTER — Ambulatory Visit (INDEPENDENT_AMBULATORY_CARE_PROVIDER_SITE_OTHER): Payer: Medicare HMO | Admitting: Physician Assistant

## 2018-11-16 VITALS — BP 110/70 | HR 76 | Ht 60.0 in | Wt 114.0 lb

## 2018-11-16 DIAGNOSIS — Z9981 Dependence on supplemental oxygen: Secondary | ICD-10-CM

## 2018-11-16 DIAGNOSIS — J9611 Chronic respiratory failure with hypoxia: Secondary | ICD-10-CM

## 2018-11-16 DIAGNOSIS — Z1211 Encounter for screening for malignant neoplasm of colon: Secondary | ICD-10-CM

## 2018-11-16 NOTE — Patient Instructions (Signed)
Your provider has ordered Cologuard testing as an option for colon cancer screening. This is performed by [employer] [facility] and may be out of network with your insurance. PRIOR to completing the test, it is YOUR responsibility to contact your insurance about covered benefits for this test. Your out of pocket expense could be anywhere from $0.00 to $649.00.   When you call to check coverage with your insurer, please provide the following information:   -The ONLY provider of Cologuard is [place] code for Cologuard is [phone].  [employer] NPI # [id number]  -[employer] Tax ID # [id number]   We have already sent your demographic and insurance information to [employer] [facility] (phone number [phone]) and they should contact you within the next week regarding your test. If you have not heard from them within the next week, please call our office at [phone].  Normal BMI (Body Mass Index- based on height and weight) is between 23 and 30. Your BMI today is Body mass index is 22.26 kg/m. [name] Please consider follow up  regarding your BMI with your Primary Care Provider.

## 2018-11-16 NOTE — Progress Notes (Signed)
Subjective:    Patient ID: Jasmine [name], female    DOB: 1946/01/10, 73 y.o.   MRN: [id number]  HPI Jasmine [name] is a pleasant 73 year old African-American female, known remotely to GI, Dr. Deatra Ina from colonoscopy in 2004.  She is referred today by her PCP Dr. [name] to discuss screening colonoscopy. Colonoscopy in 2004 was pertinent for left colon diverticulosis, no polyps.  Patient has no history of polyps and no family history of colon cancer. She does have history of severe COPD and is on chronic oxygen at 3 L nasal cannula, uses 4 L with activity at times.  She is also on a Trilogy unit at home nocturnally/noninvasive vent support. Also with history of hyperlipidemia hypothyroidism and prior history of polysubstance abuse. She has no current GI complaints.  Specifically no changes in bowel habits melena or hematochezia.  No complaints of abdominal pain.  Appetite has been fairly good, weight stable no chronic GERD.  She says she does get constipated at times when she uses pain medication but usually gets relief with prune juice or Dulcolax.  Review of Systems Pertinent positive and negative review of systems were noted in the above HPI section.  All other review of systems was otherwise negative.  Outpatient Encounter Medications as of 11/16/2018  Medication Sig  . albuterol (VENTOLIN HFA) 108 (90 Base) MCG/ACT inhaler Inhale 2 puffs into the lungs every 6 (six) hours as needed for wheezing or shortness of breath.  [name] aspirin EC 81 MG EC tablet Take 1 tablet (81 mg total) by mouth daily.  [name] atorvastatin (LIPITOR) 10 MG tablet Take 1 tablet (10 mg total) by mouth daily.  . citalopram (CELEXA) 20 MG tablet Take 1 tablet (20 mg total) by mouth daily.  . fluticasone furoate-vilanterol (BREO ELLIPTA) 200-25 MCG/INH AEPB Inhale 1 puff into the lungs daily.  . furosemide (LASIX) 40 MG tablet 1 tab by mouth in the AM daily, with 1 tab in the PM as needed for persistent swelling  .  ipratropium-albuterol (DUONEB) 0.5-2.5 (3) MG/3ML SOLN Take 3 mLs by nebulization every 6 (six) hours as needed.  [name] levothyroxine (SYNTHROID, LEVOTHROID) 75 MCG tablet TAKE 1 TABLET BY MOUTH DAILY BEFORE BREAKFAST.  [name] OXYGEN Inhale 3 L into the lungs. 24/7  . potassium chloride (K-DUR) 10 MEQ tablet Take 1 tablet (10 mEq total) by mouth daily.  [name] PRESCRIPTION MEDICATION Bipap  . tiotropium (SPIRIVA) 18 MCG inhalation capsule Place 18 mcg into inhaler and inhale daily.  . traMADol (ULTRAM) 50 MG tablet Take 1 tablet (50 mg total) by mouth 2 (two) times daily as needed.   No facility-administered encounter medications on file as of 11/16/2018.    Allergies  Allergen Reactions  . Alendronate Sodium Other (See Comments)    Patient does not remember this reaction   Patient Active Problem List   Diagnosis Date Noted  . Preventative health care 09/20/2018  . Edema due to congestive heart failure ([place]) 07/27/2018  . Palliative care by specialist   . Shortness of breath   . Chronic low back pain 04/30/2018  . COPD with acute exacerbation ([place])   . Hypothyroidism   . Anxiety   . Respiratory failure with hypercapnia ([place]) 03/24/2018  . Hypercarbia 03/22/2018  . Polysubstance abuse ([place]) 01/26/2018  . Aspiration into airway 06/03/2017  . Rash 07/12/2016  . Bilateral hearing loss 06/05/2016  . Hyperglycemia 05/21/2016  . Abnormal LFTs 03/24/2016  . Elevated blood sugar 03/24/2016  . Solitary pulmonary nodule 01/09/2016  .  Chronic respiratory failure with hypoxia ([place])   . On home oxygen therapy   . Chronic diastolic congestive heart failure ([place])   . Bacteremia Step viridans 12/27/2015  . Tobacco abuse disorder 12/25/2015  . Hypothyroidism following radioiodine therapy 09/26/2014  . Protein-calorie malnutrition, severe ([place]) 06/23/2014  . Cor pulmonale ([place]) 01/12/2013  . COPD (chronic obstructive pulmonary disease) ([place]) 11/29/2011  . Macrocytosis without anemia 11/09/2011  .  Palliative care encounter 04/09/2011  . SINUSITIS, CHRONIC 10/10/2010  . FATIGUE 10/10/2010  . Depression with anxiety 07/21/2008  . Essential hypertension 05/01/2007  . ALLERGIC RHINITIS 05/01/2007  . ASTHMA 05/01/2007  . OSTEOPOROSIS 05/01/2007   Social History   Socioeconomic History  . Marital status: Significant Other    Spouse name: Not on file  . Number of children: 0  . Years of education: Not on file  . Highest education level: Not on file  Occupational History  . Occupation: RETIRED - Facilities manager: [employer]  . Financial resource strain: Not on file  . Food insecurity:    Worry: Not on file    Inability: Not on file  . Transportation needs:    Medical: Not on file    Non-medical: Not on file  Tobacco Use  . Smoking status: Former Smoker    Packs/day: 1.00    Years: 54.00    Pack years: 54.00    Types: Cigarettes    Last attempt to quit: 07/21/2015    Years since quitting: 3.3  . Smokeless tobacco: Never Used  Substance and Sexual Activity  . Alcohol use: Yes    Alcohol/week: 2.0 standard drinks    Types: 2 Standard drinks or equivalent per week    Comment: wine coolers  . Drug use: Not Currently  . Sexual activity: Yes    Partners: Male    Birth control/protection: Post-menopausal  Lifestyle  . Physical activity:    Days per week: Not on file    Minutes per session: Not on file  . Stress: Not on file  Relationships  . Social connections:    Talks on phone: Not on file    Gets together: Not on file    Attends religious service: Not on file    Active member of club or organization: Not on file    Attends meetings of clubs or organizations: Not on file    Relationship status: Not on file  . Intimate partner violence:    Fear of current or ex partner: Not on file    Emotionally abused: Not on file    Physically abused: Not on file    Forced sexual activity: Not on file  Other Topics Concern  . Not on file   Social History Narrative   Lives in [place], [place]. Lives with husband, no children, but husband has 3 kids. Ambulatory, without cane or walker. Smokes cigarettes, currently 1 ppd, smoked for >20-30 yrs. Drinks beer, ~2/day, occasionally liquor, but ? suspect minimizing. Occasional MJ and endorses crack cocaine occasionally    Ms. Burkey's family history includes Alcohol abuse in her brother; Asthma in an other family member; Diabetes in her brother; Hypertension in an other family member; Lung cancer in her father; Stroke in her mother; Thyroid disease in her mother.      Objective:    Vitals:   11/16/18 1137  BP: 110/70  Pulse: 76    Physical Exam; well-developed older African-American female, on portable oxygen at 3 L currently  in a wheelchair but says she is ambulatory at home.  Patient is pleasant, BMI 22.2.  HEENT ;nontraumatic normocephalic EOMI PERRLA sclera anicteric oral mucosa moist, Cardiovascular ;regular rate and rhythm with S1-S2.  Pulmonary; significantly decreased breath sounds bilaterally.  Abdomen ;soft, nontender nondistended bowel sounds are active no palpable mass or hepatosplenomegaly.  Rectal ;exam not done, Extremities ;no clubbing cyanosis or edema skin warm and dry, Neuropsych; alert and oriented, grossly nonfocal mood and affect appropriate       Assessment & Plan:   #58 73 year old African-American female referred for colon cancer screening. Patient had negative colonoscopy in 2004/no polyps.  No family history of colon cancer. She is at high risk for complications with sedation due to severe oxygen dependent COPD.  Patient is on 3 L nasal O2 chronically, 4 L with activity and uses nocturnal noninvasive vent support (trilogy)  #2 diverticulosis #3.  Hyperlipidemia #4.  Hypothyroidism #5.  Chronic pain.  Plan; I discussed the options for colon cancer screening with the patient, and dressed that she would be at higher risk for complications with sedation to  her severe COPD and explained that procedure would need to be scheduled at the hospital. Discussed option of Cologuard will DNA testing, which I believe is a good option for her and noninvasive.  She would like to pursue the Cologuard test. If Cologuard returns positive, further discussion will need to be had with the patient, and consideration of other noninvasive imaging prior to scheduling colonoscopy. Patient will be established with Dr. Silverio Decamp.    Alfredia Ferguson PA-C 11/16/2018   Cc: Biagio Borg, MD

## 2018-11-22 ENCOUNTER — Telehealth: Payer: Self-pay | Admitting: Pulmonary Disease

## 2018-11-22 NOTE — Telephone Encounter (Signed)
Spoke with pt, she states she doesn't know how to put her Bipap together. The pieces were mailed to her and she is confused about how to put it together. I called Sonia Baller at [facility] at [phone] ext [phone]. There was no answer but I left message for her to return my call to see if they can get some help for pt. Will await return call.

## 2018-11-22 NOTE — Telephone Encounter (Signed)
I spoke to [place] about this patient while asking about another patient and she said she would send the RT's a message to get in contact with pt. She states they can probably set up an appt to visit and help her with Bipap. I will leave encounter open to follow up.

## 2018-11-23 NOTE — Progress Notes (Addendum)
Subjective:   Jasmine [name] is a 73 y.o. female who presents for an Initial Medicare Annual Wellness Visit.  Review of Systems    No ROS.  Medicare Wellness Visit. Additional risk factors are reflected in the social history. Cardiac Risk Factors include: advanced age (>45men, >34 women);dyslipidemia;sedentary lifestyle Sleep patterns: gets up 1-2 times nightly to void and sleeps 7 hours nightly.    Home Safety/Smoke Alarms: Feels safe in home. Smoke alarms in place.  Living environment; residence and Firearm Safety: 1-story house/ trailer. Lives with ex-husband, no needs for DME, good support system Seat Belt Safety/Bike Helmet: Wears seat belt.     Objective:    Today's Vitals   11/24/18 1528 11/24/18 1532  BP: 118/64   Pulse: 69   Resp: 17   SpO2: 98%   Weight: 117 lb (53.1 kg)   Height: 5' (1.524 m)   PainSc:  1    Body mass index is 22.85 kg/m.  Advanced Directives 11/24/2018 05/26/2018 05/24/2018 04/30/2018 03/28/2018 03/25/2018 03/23/2018  Does Patient Have a Medical Advance Directive? Yes No No No No No No  Type of Paramedic of [place];Living will - - - - - -  Does patient want to make changes to medical advance directive? - - - - - - -  Copy of [place] in Chart? No - copy requested - - - - - -  Would patient like information on creating a medical advance directive? - No - Patient declined No - Patient declined No - Patient declined Yes (Inpatient - patient requests chaplain consult to create a medical advance directive) No - Patient declined No - Patient declined  Pre-existing out of facility DNR order (yellow form or pink MOST form) - - - - - - -    Current Medications (verified) Outpatient Encounter Medications as of 11/24/2018  Medication Sig  . albuterol (VENTOLIN HFA) 108 (90 Base) MCG/ACT inhaler Inhale 2 puffs into the lungs every 6 (six) hours as needed for wheezing or shortness of breath.  [name] aspirin EC 81 MG EC tablet  Take 1 tablet (81 mg total) by mouth daily.  [name] atorvastatin (LIPITOR) 10 MG tablet Take 1 tablet (10 mg total) by mouth daily.  . citalopram (CELEXA) 20 MG tablet Take 1 tablet (20 mg total) by mouth daily.  . fluticasone furoate-vilanterol (BREO ELLIPTA) 200-25 MCG/INH AEPB Inhale 1 puff into the lungs daily.  . furosemide (LASIX) 40 MG tablet 1 tab by mouth in the AM daily, with 1 tab in the PM as needed for persistent swelling  . ipratropium-albuterol (DUONEB) 0.5-2.5 (3) MG/3ML SOLN Take 3 mLs by nebulization every 6 (six) hours as needed.  [name] levothyroxine (SYNTHROID, LEVOTHROID) 75 MCG tablet TAKE 1 TABLET BY MOUTH DAILY BEFORE BREAKFAST.  [name] OXYGEN Inhale 3 L into the lungs. 24/7  . potassium chloride (K-DUR) 10 MEQ tablet Take 1 tablet (10 mEq total) by mouth daily.  [name] PRESCRIPTION MEDICATION Bipap  . tiotropium (SPIRIVA) 18 MCG inhalation capsule Place 18 mcg into inhaler and inhale daily.  . traMADol (ULTRAM) 50 MG tablet Take 1 tablet (50 mg total) by mouth 2 (two) times daily as needed.   No facility-administered encounter medications on file as of 11/24/2018.     Allergies (verified) Alendronate sodium   History: Past Medical History:  Diagnosis Date  . ALLERGIC RHINITIS 05/01/2007  . Anxiety   . Arthritis    "hands" (12/28/2015)  . ASTHMA 05/01/2007   "since  I was a child"  . Cancer (HCC)    LUNG  . CHF (congestive heart failure) ([place])   . Chronic bronchitis ([place])   . COPD (chronic obstructive pulmonary disease) ([place])   . DEPRESSION 07/21/2008  . DVT (deep venous thrombosis) (HCC) 1960s   LLE  . FATIGUE 10/10/2010  . HYPERTENSION 05/01/2007  . HYPERTHYROIDISM 11/23/2007   Pt endorses having had Graves disease, possibly radioactive iodine x1, but no thyroidectomy  . HYPOTHYROIDISM 08/23/2009  . LUMBAR RADICULOPATHY, RIGHT 08/25/2008  . On home oxygen therapy    "3-4L; qd; all the time" (12/28/2015)  . OSTEOPOROSIS 05/01/2007  . SHOULDER PAIN, LEFT 08/23/2009  . SINUSITIS,  CHRONIC 10/10/2010   Past Surgical History:  Procedure Laterality Date  . APPENDECTOMY    . DILATION AND CURETTAGE OF UTERUS  multiple   history of multiple dialations and curettages and miscarriages, unfortunately never carrying a child to term  . ECTOPIC PREGNANCY SURGERY  "late '60s or early '70s  . ELECTROCARDIOGRAM  06/20/2006  . OOPHORECTOMY Right    Family History  Problem Relation Age of Onset  . Lung cancer Father   . Alcohol abuse Brother   . Diabetes Brother   . Hypertension Other   . Stroke Mother   . Thyroid disease Mother   . Asthma Other        maternal aunts  . Breast cancer Neg Hx    Social History   Socioeconomic History  . Marital status: Significant Other    Spouse name: Not on file  . Number of children: 0  . Years of education: Not on file  . Highest education level: Not on file  Occupational History  . Occupation: RETIRED - Facilities manager: [employer]  . Financial resource strain: Not hard at all  . Food insecurity:    Worry: Never true    Inability: Never true  . Transportation needs:    Medical: No    Non-medical: No  Tobacco Use  . Smoking status: Former Smoker    Packs/day: 1.00    Years: 54.00    Pack years: 54.00    Types: Cigarettes    Last attempt to quit: 07/21/2015    Years since quitting: 3.3  . Smokeless tobacco: Never Used  Substance and Sexual Activity  . Alcohol use: Yes    Alcohol/week: 2.0 standard drinks    Types: 2 Standard drinks or equivalent per week    Comment: Dennisha Mouser coolers  . Drug use: Not Currently  . Sexual activity: Yes    Partners: Male    Birth control/protection: Post-menopausal  Lifestyle  . Physical activity:    Days per week: 0 days    Minutes per session: 0 min  . Stress: Only a little  Relationships  . Social connections:    Talks on phone: More than three times a week    Gets together: More than three times a week    Attends religious service: More than 4  times per year    Active member of club or organization: Yes    Attends meetings of clubs or organizations: More than 4 times per year    Relationship status: Living with partner  Other Topics Concern  . Not on file  Social History Narrative   Lives in [place], [place]. Lives with husband, no children, but husband has 3 kids. Ambulatory, without cane or walker. Smokes cigarettes, currently 1 ppd, smoked for >20-30 yrs. Drinks beer, ~  2/day, occasionally liquor, but ? suspect minimizing. Occasional MJ and endorses crack cocaine occasionally    Tobacco Counseling Counseling given: Not Answered  Activities of Daily Living In your present state of health, do you have any difficulty performing the following activities: 11/24/2018 08/05/2018  Hearing? N Y  Vision? N N  Difficulty concentrating or making decisions? N Y  Walking or climbing stairs? Y Y  Dressing or bathing? N N  Doing errands, shopping? N Y  Conservation officer, nature and eating ? N N  Using the Toilet? N N  In the past six months, have you accidently leaked urine? N N  Do you have problems with loss of bowel control? N N  Managing your Medications? N Y  Comment - -  Managing your Finances? N N  Housekeeping or managing your Housekeeping? [name]  Some recent data might be hidden     Immunizations and Health Maintenance Immunization History  Administered Date(s) Administered  . Influenza Split 07/10/2011, 08/18/2012  . Influenza Whole 07/21/2008, 08/23/2009, 10/10/2010  . Influenza, High Dose Seasonal PF 07/10/2016, 09/03/2017  . Influenza,inj,Quad PF,6+ Mos 08/12/2013, 07/10/2014, 07/08/2018  . Pneumococcal Conjugate-13 11/30/2014  . Pneumococcal Polysaccharide-23 07/21/2007, 01/12/2013, 06/16/2018  . Td 08/23/2009  . Zoster 07/20/2006   Health Maintenance Due  Topic Date Due  . COLONOSCOPY  07/19/2013    Patient Care Team: Biagio Borg, MD as PCP - General Renato Shin, MD as Attending Physician (Internal  Medicine) Ple[place], Eppie Gibson, RN as [facility] Rigoberto Noel, MD as Consulting Physician (Pulmonary Disease)  Indicate any recent Medical Services you may have received from other than [facility] providers in the past year (date may be approximate).     Assessment:   This is a routine wellness examination for [place]. Physical assessment deferred to PCP.  Hearing/Vision screen Hearing Screening Comments: Able to hear conversational tones w/o difficulty. No issues reported.  Passed whisper test Vision Screening Comments: appointment yearly   Dietary issues and exercise activities discussed: Current Exercise Habits: The patient does not participate in regular exercise at present, Exercise limited by: respiratory conditions(s)  Diet (meal preparation, eat out, water intake, caffeinated beverages, dairy products, fruits and vegetables): in general, a "healthy" diet   Reports poor appetite.   Reviewed heart healthy diet.  Discussed supplementing with Ensure, samples and coupons provided. Encouraged patient to increase daily water and healthy fluid intake.   Goals    . Patient Stated     I want to start to do chair exercises and check into Silver Sneakers.      Depression Screen PHQ 2/9 Scores 11/24/2018 09/20/2018 08/05/2018 06/16/2018 04/30/2018 04/28/2018 02/19/2017  PHQ - 2 Score 3 0 0 0 2 1 0  PHQ- 9 Score 8 - - - 4 - -    Fall Risk Fall Risk  11/24/2018 09/20/2018 08/05/2018 06/16/2018 04/30/2018  Falls in the past year? 0 1 Yes Yes Yes  Number falls in past yr: - 0 1 1 1   Comment - tripped on rug in [place] - - -  Injury with Fall? - 0 - No Yes  Risk Factor Category  - - - - High Fall Risk  Risk for fall due to : - - History of fall(s);Impaired balance/gait;Impaired mobility - History of fall(s)  Follow up - - Falls evaluation completed;Falls prevention discussed - Education provided;Falls prevention discussed   Cognitive Function:       Ad8 score reviewed  for issues:  Issues making decisions:  no  Less interest in hobbies / activities: no  Repeats questions, stories (family complaining): no  Trouble using ordinary gadgets (microwave, computer, phone):no  Forgets the month or year: no  Mismanaging finances: no  Remembering appts: no  Daily problems with thinking and/or memory: no Ad8 score is= 0  Screening Tests Health Maintenance  Topic Date Due  . COLONOSCOPY  07/19/2013  . DEXA SCAN  09/21/2019 (Originally 11/07/2009)  . TETANUS/TDAP  08/24/2019  . MAMMOGRAM  11/01/2020  . INFLUENZA VACCINE  Completed  . Hepatitis C Screening  Completed  . PNA vac Low Risk Adult  Completed     Plan:    Senior community resources provided.  Reviewed health maintenance screenings with patient today and relevant education, vaccines, and/or referrals were provided.  Patient reports that Cologuard has been ordered previously.   Continue doing brain stimulating activities (puzzles, reading, adult coloring books, staying active) to keep memory sharp.   Continue to eat heart healthy diet (full of fruits, vegetables, whole grains, lean protein, water--limit salt, fat, and sugar intake) and increase physical activity as tolerated.  I have personally reviewed and noted the following in the patient's chart:   . Medical and social history . Use of alcohol, tobacco or illicit drugs  . Current medications and supplements . Functional ability and status . Nutritional status . Physical activity . Advanced directives . List of other physicians . Vitals . Screenings to include cognitive, depression, and falls . Referrals and appointments  In addition, I have reviewed and discussed with patient certain preventive protocols, quality metrics, and best practice recommendations. A written personalized care plan for preventive services as well as general preventive health recommendations were provided to patient.     Michiel Cowboy, RN   11/24/2018      Medical screening examination/treatment/procedure(s) were performed by non-physician practitioner and as supervising physician I was immediately available for consultation/collaboration. I agree with above. [name], MD

## 2018-11-24 ENCOUNTER — Ambulatory Visit (INDEPENDENT_AMBULATORY_CARE_PROVIDER_SITE_OTHER)
Admission: RE | Admit: 2018-11-24 | Discharge: 2018-11-24 | Disposition: A | Discharge status: Home / Self Care | Payer: Medicare HMO | Source: Ambulatory Visit | Attending: Pulmonary Disease | Admitting: Pulmonary Disease

## 2018-11-24 ENCOUNTER — Ambulatory Visit (INDEPENDENT_AMBULATORY_CARE_PROVIDER_SITE_OTHER): Payer: Medicare HMO | Admitting: *Deleted

## 2018-11-24 VITALS — BP 118/64 | HR 69 | Resp 17 | Ht 60.0 in | Wt 117.0 lb

## 2018-11-24 DIAGNOSIS — R918 Other nonspecific abnormal finding of lung field: Secondary | ICD-10-CM | POA: Diagnosis not present

## 2018-11-24 DIAGNOSIS — R911 Solitary pulmonary nodule: Secondary | ICD-10-CM

## 2018-11-24 DIAGNOSIS — Z0000 Encounter for general adult medical examination without abnormal findings: Secondary | ICD-10-CM | POA: Diagnosis not present

## 2018-11-24 NOTE — Progress Notes (Signed)
Reviewed and agree with documentation and assessment and plan. K. Veena Farooq Petrovich , MD   

## 2018-11-24 NOTE — Patient Instructions (Addendum)
Continue doing brain stimulating activities (puzzles, reading, adult coloring books, staying active) to keep memory sharp.   Continue to eat heart healthy diet (full of fruits, vegetables, whole grains, lean protein, water--limit salt, fat, and sugar intake) and increase physical activity as tolerated.  If you or someone you know has experienced the death of a loved one, the support of others can play an invaluable role in the healing process. [place] offers grief and loss services for anyone in the community. Contact us today [phone]  Jasmine [name] , Thank you for taking time to come for your Medicare Wellness Visit. I appreciate your ongoing commitment to your health goals. Please review the following plan we discussed and let me know if I can assist you in the future.   These are the goals we discussed: Goals    . Patient Stated     I want to start to do chair exercises and check into Silver Sneakers.       This is a list of the screening recommended for you and due dates:  Health Maintenance  Topic Date Due  . Colon Cancer Screening  07/19/2013  . DEXA scan (bone density measurement)  09/21/2019*  . Tetanus Vaccine  08/24/2019  . Mammogram  11/01/2020  . Flu Shot  Completed  .  Hepatitis C: One time screening is recommended by Center for Disease Control  (CDC) for  adults born from [birth year] through 1966.   Completed  . Pneumonia vaccines  Completed  *Topic was postponed. The date shown is not the original due date.   Health Maintenance, Female Adopting a healthy lifestyle and getting preventive care can go a long way to promote health and wellness. Talk with your health care provider about what schedule of regular examinations is right for you. This is a good chance for you to check in with your provider about disease prevention and staying healthy. In between checkups, there are plenty of things you can do on your own. Experts have done a lot of research  about which lifestyle changes and preventive measures are most likely to keep you healthy. Ask your health care provider for more information. Weight and diet Eat a healthy diet  Be sure to include plenty of vegetables, fruits, low-fat dairy products, and lean protein.  Do not eat a lot of foods high in solid fats, added sugars, or salt.  Get regular exercise. This is one of the most important things you can do for your health. ? Most adults should exercise for at least 150 minutes each week. The exercise should increase your heart rate and make you sweat (moderate-intensity exercise). ? Most adults should also do strengthening exercises at least twice a week. This is in addition to the moderate-intensity exercise. Maintain a healthy weight  Body mass index (BMI) is a measurement that can be used to identify possible weight problems. It estimates body fat based on height and weight. Your health care provider can help determine your BMI and help you achieve or maintain a healthy weight.  For females 21 years of age and older: ? A BMI below 18.5 is considered underweight. ? A BMI of 18.5 to 24.9 is normal. ? A BMI of 25 to 29.9 is considered overweight. ? A BMI of 30 and above is considered obese. Watch levels of cholesterol and blood lipids  You should start having your blood tested for lipids and cholesterol at 74 years of age, then have this test every  5 years.  You may need to have your cholesterol levels checked more often if: ? Your lipid or cholesterol levels are high. ? You are older than 74 years of age. ? You are at high risk for heart disease. Cancer screening Lung Cancer  Lung cancer screening is recommended for adults 18-17 years old who are at high risk for lung cancer because of a history of smoking.  A yearly low-dose CT scan of the lungs is recommended for people who: ? Currently smoke. ? Have quit within the past 15 years. ? Have at least a 30-pack-year history of  smoking. A pack year is smoking an average of one pack of cigarettes a day for 1 year.  Yearly screening should continue until it has been 15 years since you quit.  Yearly screening should stop if you develop a health problem that would prevent you from having lung cancer treatment. Breast Cancer  Practice breast self-awareness. This means understanding how your breasts normally appear and feel.  It also means doing regular breast self-exams. Let your health care provider know about any changes, no matter how small.  If you are in your 20s or 30s, you should have a clinical breast exam (CBE) by a health care provider every 1-3 years as part of a regular health exam.  If you are 85 or older, have a CBE every year. Also consider having a breast X-ray (mammogram) every year.  If you have a family history of breast cancer, talk to your health care provider about genetic screening.  If you are at high risk for breast cancer, talk to your health care provider about having an MRI and a mammogram every year.  Breast cancer gene (BRCA) assessment is recommended for women who have family members with BRCA-related cancers. BRCA-related cancers include: ? Breast. ? Ovarian. ? Tubal. ? Peritoneal cancers.  Results of the assessment will determine the need for genetic counseling and BRCA1 and BRCA2 testing. Cervical Cancer Your health care provider may recommend that you be screened regularly for cancer of the pelvic organs (ovaries, uterus, and vagina). This screening involves a pelvic examination, including checking for microscopic changes to the surface of your cervix (Pap test). You may be encouraged to have this screening done every 3 years, beginning at age 37.  For women ages 25-65, health care providers may recommend pelvic exams and Pap testing every 3 years, or they may recommend the Pap and pelvic exam, combined with testing for human papilloma virus (HPV), every 5 years. Some types of HPV  increase your risk of cervical cancer. Testing for HPV may also be done on women of any age with unclear Pap test results.  Other health care providers may not recommend any screening for nonpregnant women who are considered low risk for pelvic cancer and who do not have symptoms. Ask your health care provider if a screening pelvic exam is right for you.  If you have had past treatment for cervical cancer or a condition that could lead to cancer, you need Pap tests and screening for cancer for at least 20 years after your treatment. If Pap tests have been discontinued, your risk factors (such as having a new sexual partner) need to be reassessed to determine if screening should resume. Some women have medical problems that increase the chance of getting cervical cancer. In these cases, your health care provider may recommend more frequent screening and Pap tests. Colorectal Cancer  This type of cancer can be detected and  often prevented.  Routine colorectal cancer screening usually begins at 74 years of age and continues through 74 years of age.  Your health care provider may recommend screening at an earlier age if you have risk factors for colon cancer.  Your health care provider may also recommend using home test kits to check for hidden blood in the stool.  A small camera at the end of a tube can be used to examine your colon directly (sigmoidoscopy or colonoscopy). This is done to check for the earliest forms of colorectal cancer.  Routine screening usually begins at age 37.  Direct examination of the colon should be repeated every 5-10 years through 74 years of age. However, you may need to be screened more often if early forms of precancerous polyps or small growths are found. Skin Cancer  Check your skin from head to toe regularly.  Tell your health care provider about any new moles or changes in moles, especially if there is a change in a mole's shape or color.  Also tell your  health care provider if you have a mole that is larger than the size of a pencil eraser.  Always use sunscreen. Apply sunscreen liberally and repeatedly throughout the day.  Protect yourself by wearing long sleeves, pants, a wide-brimmed hat, and sunglasses whenever you are outside. Heart disease, diabetes, and high blood pressure  High blood pressure causes heart disease and increases the risk of stroke. High blood pressure is more likely to develop in: ? People who have blood pressure in the high end of the normal range (130-139/85-89 mm Hg). ? People who are overweight or obese. ? People who are African American.  If you are 41-59 years of age, have your blood pressure checked every 3-5 years. If you are 16 years of age or older, have your blood pressure checked every year. You should have your blood pressure measured twice-once when you are at a hospital or clinic, and once when you are not at a hospital or clinic. Record the average of the two measurements. To check your blood pressure when you are not at a hospital or clinic, you can use: ? An automated blood pressure machine at a pharmacy. ? A home blood pressure monitor.  If you are between 56 years and 59 years old, ask your health care provider if you should take aspirin to prevent strokes.  Have regular diabetes screenings. This involves taking a blood sample to check your fasting blood sugar level. ? If you are at a normal weight and have a low risk for diabetes, have this test once every three years after 74 years of age. ? If you are overweight and have a high risk for diabetes, consider being tested at a younger age or more often. Preventing infection Hepatitis B  If you have a higher risk for hepatitis B, you should be screened for this virus. You are considered at high risk for hepatitis B if: ? You were born in a country where hepatitis B is common. Ask your health care provider which countries are considered high  risk. ? Your parents were born in a high-risk country, and you have not been immunized against hepatitis B (hepatitis B vaccine). ? You have HIV or AIDS. ? You use needles to inject street drugs. ? You live with someone who has hepatitis B. ? You have had sex with someone who has hepatitis B. ? You get hemodialysis treatment. ? You take certain medicines for conditions, including cancer,  organ transplantation, and autoimmune conditions. Hepatitis C  Blood testing is recommended for: ? Everyone born from [birth year] through 1966. ? Anyone with known risk factors for hepatitis C. Sexually transmitted infections (STIs)  You should be screened for sexually transmitted infections (STIs) including gonorrhea and chlamydia if: ? You are sexually active and are younger than 75 years of age. ? You are older than 74 years of age and your health care provider tells you that you are at risk for this type of infection. ? Your sexual activity has changed since you were last screened and you are at an increased risk for chlamydia or gonorrhea. Ask your health care provider if you are at risk.  If you do not have HIV, but are at risk, it may be recommended that you take a prescription medicine daily to prevent HIV infection. This is called pre-exposure prophylaxis (PrEP). You are considered at risk if: ? You are sexually active and do not regularly use condoms or know the HIV status of your partner(s). ? You take drugs by injection. ? You are sexually active with a partner who has HIV. Talk with your health care provider about whether you are at high risk of being infected with HIV. If you choose to begin PrEP, you should first be tested for HIV. You should then be tested every 3 months for as long as you are taking PrEP. Pregnancy  If you are premenopausal and you may become pregnant, ask your health care provider about preconception counseling.  If you may become pregnant, take 400 to 800 micrograms (mcg) of  folic acid every day.  If you want to prevent pregnancy, talk to your health care provider about birth control (contraception). Osteoporosis and menopause  Osteoporosis is a disease in which the bones lose minerals and strength with aging. This can result in serious bone fractures. Your risk for osteoporosis can be identified using a bone density scan.  If you are 60 years of age or older, or if you are at risk for osteoporosis and fractures, ask your health care provider if you should be screened.  Ask your health care provider whether you should take a calcium or vitamin D supplement to lower your risk for osteoporosis.  Menopause may have certain physical symptoms and risks.  Hormone replacement therapy may reduce some of these symptoms and risks. Talk to your health care provider about whether hormone replacement therapy is right for you. Follow these instructions at home:  Schedule regular health, dental, and eye exams.  Stay current with your immunizations.  Do not use any tobacco products including cigarettes, chewing tobacco, or electronic cigarettes.  If you are pregnant, do not drink alcohol.  If you are breastfeeding, limit how much and how often you drink alcohol.  Limit alcohol intake to no more than 1 drink per day for nonpregnant women. One drink equals 12 ounces of beer, 5 ounces of wine, or 1 ounces of hard liquor.  Do not use street drugs.  Do not share needles.  Ask your health care provider for help if you need support or information about quitting drugs.  Tell your health care provider if you often feel depressed.  Tell your health care provider if you have ever been abused or do not feel safe at home. This information is not intended to replace advice given to you by your health care provider. Make sure you discuss any questions you have with your health care provider. Document Released: 04/21/2011 Document Revised: 03/13/2016  Document Reviewed:  07/10/2015 Elsevier Interactive Patient Education  [employer].

## 2018-11-24 NOTE — Telephone Encounter (Signed)
Spoke with pt. She states that [facility] is coming to her home tomorrow to help with this matter. Nothing further was needed.

## 2018-11-25 DIAGNOSIS — J449 Chronic obstructive pulmonary disease, unspecified: Secondary | ICD-10-CM | POA: Diagnosis not present

## 2018-11-28 DIAGNOSIS — E890 Postprocedural hypothyroidism: Secondary | ICD-10-CM | POA: Diagnosis not present

## 2018-11-28 DIAGNOSIS — I5032 Chronic diastolic (congestive) heart failure: Secondary | ICD-10-CM | POA: Diagnosis not present

## 2018-11-28 DIAGNOSIS — E43 Unspecified severe protein-calorie malnutrition: Secondary | ICD-10-CM | POA: Diagnosis not present

## 2018-11-28 DIAGNOSIS — I2781 Cor pulmonale (chronic): Secondary | ICD-10-CM | POA: Diagnosis not present

## 2018-11-28 DIAGNOSIS — F418 Other specified anxiety disorders: Secondary | ICD-10-CM | POA: Diagnosis not present

## 2018-11-28 DIAGNOSIS — R7881 Bacteremia: Secondary | ICD-10-CM | POA: Diagnosis not present

## 2018-11-28 DIAGNOSIS — B9561 Methicillin susceptible Staphylococcus aureus infection as the cause of diseases classified elsewhere: Secondary | ICD-10-CM | POA: Diagnosis not present

## 2018-11-28 DIAGNOSIS — J449 Chronic obstructive pulmonary disease, unspecified: Secondary | ICD-10-CM | POA: Diagnosis not present

## 2018-11-28 DIAGNOSIS — J9612 Chronic respiratory failure with hypercapnia: Secondary | ICD-10-CM | POA: Diagnosis not present

## 2018-11-29 ENCOUNTER — Other Ambulatory Visit: Payer: Self-pay | Admitting: *Deleted

## 2018-11-29 NOTE — Patient Outreach (Signed)
[facility]) Care Management  11/29/2018   Jasmine [name] May 11, 1946 [id number]  RN Health Coach telephone call to patient.  [employer] compliance verified. Per patient she does not have a cough at this time. She has shortness of breath with activity. Patient is wearing her oxygen at all times. Patient is now using her CPAP at night. RN discussed control and rescue medications. Patient had difficulty understanding her medications. Per patient she had two medications that she was to put in her nebulizer but she stopped taking one of the medication. The patient doesn't think she needed to put both medications in the nebulizer. Patient has agreed to further outreach calls.  Current Medications:  Current Outpatient Medications  Medication Sig Dispense Refill  . albuterol (VENTOLIN HFA) 108 (90 Base) MCG/ACT inhaler Inhale 2 puffs into the lungs every 6 (six) hours as needed for wheezing or shortness of breath.    [name] aspirin EC 81 MG EC tablet Take 1 tablet (81 mg total) by mouth daily. 30 tablet 0  . atorvastatin (LIPITOR) 10 MG tablet Take 1 tablet (10 mg total) by mouth daily. 90 tablet 3  . citalopram (CELEXA) 20 MG tablet Take 1 tablet (20 mg total) by mouth daily. 90 tablet 3  . fluticasone furoate-vilanterol (BREO ELLIPTA) 200-25 MCG/INH AEPB Inhale 1 puff into the lungs daily. 1 each 5  . furosemide (LASIX) 40 MG tablet 1 tab by mouth in the AM daily, with 1 tab in the PM as needed for persistent swelling 180 tablet 3  . ipratropium-albuterol (DUONEB) 0.5-2.5 (3) MG/3ML SOLN Take 3 mLs by nebulization every 6 (six) hours as needed.    [name] levothyroxine (SYNTHROID, LEVOTHROID) 75 MCG tablet TAKE 1 TABLET BY MOUTH DAILY BEFORE BREAKFAST. 90 tablet 1  . OXYGEN Inhale 3 L into the lungs. 24/7    . potassium chloride (K-DUR) 10 MEQ tablet Take 1 tablet (10 mEq total) by mouth daily. 90 tablet 3  . PRESCRIPTION MEDICATION Bipap    . tiotropium (SPIRIVA) 18 MCG inhalation capsule Place  18 mcg into inhaler and inhale daily.    . traMADol (ULTRAM) 50 MG tablet Take 1 tablet (50 mg total) by mouth 2 (two) times daily as needed. 60 tablet 2   No current facility-administered medications for this visit.     Functional Status:  In your present state of health, do you have any difficulty performing the following activities: 11/29/2018 11/24/2018  Hearing? N N  Vision? N N  Difficulty concentrating or making decisions? Y N  Walking or climbing stairs? Y Y  Dressing or bathing? N N  Doing errands, shopping? Y N  Preparing Food and eating ? N N  Using the Toilet? N N  In the past six months, have you accidently leaked urine? N N  Do you have problems with loss of bowel control? N N  Managing your Medications? N N  Managing your Finances? N N  Housekeeping or managing your Housekeeping? Y Y  Some recent data might be hidden    Fall/Depression Screening: Fall Risk  11/29/2018 11/24/2018 09/20/2018  Falls in the past year? 1 0 1  Number falls in past yr: 1 - 0  Comment - - tripped on rug in [place]  Injury with Fall? 0 - 0  Risk Factor Category  - - -  Risk for fall due to : History of fall(s);Impaired balance/gait;Impaired mobility - -  Follow up Falls evaluation completed;Falls prevention discussed - -   PHQ 2/9  Scores 11/29/2018 11/24/2018 09/20/2018 08/05/2018 06/16/2018 04/30/2018 04/28/2018  PHQ - 2 Score 3 3 0 0 0 2 1  PHQ- 9 Score - 8 - - - 4 -   THN CM Care Plan Problem One     Most Recent Value  Care Plan Problem One  Knowledge Deficit in Self management of COPD  Role Documenting the Problem One  [place] for Problem One  Active  THN Long Term Goal   Patient will not have any readmission for COPD within the next 90 days  THN Long Term Goal Start Date  11/29/18  Interventions for Problem One Long Term Goal  RN reiterated medication adherence. RN noted discrepency in medication administration. Referred to pharmacy. RN reiterated follow up appointments. RN will  follow up with further discussion and teach back  THN CM Short Term Goal #1 Met Date  11/29/18  [facility] CM Short Term Goal #2   Patient will report having a better understanding of usage of CPAP machine within the next 30 days  THN CM Short Term Goal #2 Start Date  11/29/18  Interventions for Short Term Goal #2  RN discussed Cpap issues. Patient stated that someone from [facility] will be coming once a month to check patient for proper usage and changing the filters. RN will follow up to make sure follow up care is received  [facility] CM Short Term Goal #3 Met Date  11/29/18  [facility] CM Short Term Goal #4  Patient will verbalize taking proper medications through nebulizer as per Dr ordered within the next 30 days  THN CM Short Term Goal #4 Start Date  11/29/18  Interventions for Short Term Goal #4  RN discussed medications patient taking through nebulizer. Patient has stopped one medication becuase she didn't think she needed both but did not understand the function of the meds. RN referred to pharmacy for further discussion      Assessment:  Patient is using CPAP nightly Patient is using oxygen continuously Patient is using nebulizer Patient will benefit from Health Coach telephonic outreach for education and support for COPD self management.  Plan: Referred to Pharmacy  RN sent Clinical key education on How to use a nebulizer RN sent Clinical key education on Albuterol; Ipratropium solution for inhalation RN sent how to use a metered dose inhaler RN sent how to use a dry powder inhaler RN sent Ensure Coupons RN will follow up within the month of April  Adabella Stanis [facility] [phone]

## 2018-11-30 ENCOUNTER — Ambulatory Visit: Payer: Medicare HMO | Admitting: Pulmonary Disease

## 2018-11-30 ENCOUNTER — Other Ambulatory Visit: Payer: Self-pay | Admitting: Pharmacist

## 2018-11-30 ENCOUNTER — Encounter: Payer: Medicare HMO | Admitting: Gastroenterology

## 2018-11-30 NOTE — Patient Outreach (Signed)
[facility]) Care Management  [place]   11/30/2018  Jasmine [name] August 12, 1946 [id number]  Reason for referral: Medication Adherence  Referral source: [place] [facility] Current insurance:[employer] [facility]  PMHx includes but not limited to:  Asthma / COPD, former smoker, hx polysubstance abuse, depression / anxiety, HTN, HLD, hypothyroidism, osteoporosis, chronic diastolic congestive heart failure (EF 65-70% 3/'17)  Outreach:  Successful telephone call with Jasmine [name].  HIPAA identifiers verified.   Subjective:  Patient reports she gets her medications delivered from [facility].  She states she is only taking one medication via nebulizer and has 3 other inhalers including Spiriva, Breo, and Albuterol.  Patient able to verbalize name and directions for each inhaler and nebulizer.   Objective: Lab Results  Component Value Date   CREATININE 0.76 09/20/2018   CREATININE 0.67 06/16/2018   CREATININE 0.62 05/26/2018    Lab Results  Component Value Date   HGBA1C 6.1 09/20/2018    Lipid Panel     Component Value Date/Time   CHOL 250 (H) 09/20/2018 1426   TRIG 62.0 09/20/2018 1426   HDL 98.60 09/20/2018 1426   CHOLHDL 3 09/20/2018 1426   VLDL 12.4 09/20/2018 1426   LDLCALC 139 (H) 09/20/2018 1426   LDLDIRECT 102.1 10/10/2010 1045    BP Readings from Last 3 Encounters:  11/24/18 118/64  11/16/18 110/70  09/20/18 126/74    Allergies  Allergen Reactions  . Alendronate Sodium Other (See Comments)    Patient does not remember this reaction    Medications Reviewed Today    Reviewed by Michiel Cowboy, RN (Registered Nurse) on 11/24/18 at [place] List Status: <None>  Medication Order Taking? Sig Documenting Provider Last Dose Status Informant  albuterol (VENTOLIN HFA) 108 (90 Base) MCG/ACT inhaler [id number] Yes Inhale 2 puffs into the lungs every 6 (six) hours as needed for wheezing or shortness of breath. [provider] Taking Active Pharmacy Records  aspirin EC 81 MG EC tablet [id number] Yes Take 1 tablet (81 mg total) by mouth daily. Velvet Bathe, MD Taking Active Spouse/Significant Other  atorvastatin (LIPITOR) 10 MG tablet [id number] Yes Take 1 tablet (10 mg total) by mouth daily. Biagio Borg, MD Taking Active   citalopram (CELEXA) 20 MG tablet [id number] Yes Take 1 tablet (20 mg total) by mouth daily. Biagio Borg, MD Taking Active   fluticasone furoate-vilanterol (BREO ELLIPTA) 200-25 MCG/INH AEPB [id number] Yes Inhale 1 puff into the lungs daily. Robbie Lis, MD Taking Active   furosemide (LASIX) 40 MG tablet [id number] Yes 1 tab by mouth in the AM daily, with 1 tab in the PM as needed for persistent swelling Biagio Borg, MD Taking Active   ipratropium-albuterol (DUONEB) 0.5-2.5 (3) MG/3ML SOLN [id number] Yes Take 3 mLs by nebulization every 6 (six) hours as needed. [provider] Taking Active   levothyroxine (SYNTHROID, LEVOTHROID) 75 MCG tablet [id number] Yes TAKE 1 TABLET BY MOUTH DAILY BEFORE BREAKFAST. Biagio Borg, MD Taking Active   OXYGEN [id number] Yes Inhale 3 L into the lungs. 24/7 [provider] Taking Active   potassium chloride (K-DUR) 10 MEQ tablet [id number] Yes Take 1 tablet (10 mEq total) by mouth daily. Biagio Borg, MD Taking Active   PRESCRIPTION MEDICATION [id number] Yes Bipap [provider] Taking Active Spouse/Significant Other  tiotropium (SPIRIVA) 18 MCG inhalation capsule [id number] Yes Place 18 mcg into inhaler and inhale daily. [provider] Taking Active   traMADol Veatrice Bourbon)  50 MG tablet [id number] Yes Take 1 tablet (50 mg total) by mouth 2 (two) times daily as needed. Biagio Borg, MD Taking Active Pharmacy Records  Med List Note Burnett Harry, CPhT 03/29/18 1107):            Assessment:  Drugs sorted by system:  Neurologic/Psychologic: citalopram  Cardiovascular: aspirin 81mg , atorvastatin  Pulmonary/Allergy: albuterol  inhaler, fluticasone furoate-vilanterol, tiotropium, ipratropium-albuterol nebulizer  Endocrine: levothyroxine  Pain:tramadol  Vitamins/Minerals/Supplements:potassium  Medication Review Findings:  . Elevated LDL on lab check in December, patient started on statin . Patient able to state how she is taking all pulmonary medications and has all products in her home, available for use.  No confusion noted during my conversation with patient.  I advised patient to bring all inhalers with her to appointment with pulmonologist, Dr. Elsworth Soho, tomorrow to review administration technique.    Patient appreciative of [facility] services and no further medication issues or concerns.    Medication Assistance Findings:  No medication assistance needs identified  Plan: Will close [facility] pharmacy case as no further medication needs identified at this time.  Am happy to assist in the future as needed.     Ralene Bathe, PharmD, [place] [phone]

## 2018-12-01 ENCOUNTER — Ambulatory Visit (INDEPENDENT_AMBULATORY_CARE_PROVIDER_SITE_OTHER): Payer: Medicare HMO | Admitting: Pulmonary Disease

## 2018-12-01 ENCOUNTER — Encounter: Payer: Self-pay | Admitting: Pulmonary Disease

## 2018-12-01 DIAGNOSIS — R911 Solitary pulmonary nodule: Secondary | ICD-10-CM | POA: Diagnosis not present

## 2018-12-01 DIAGNOSIS — J9612 Chronic respiratory failure with hypercapnia: Secondary | ICD-10-CM | POA: Diagnosis not present

## 2018-12-01 DIAGNOSIS — J432 Centrilobular emphysema: Secondary | ICD-10-CM | POA: Diagnosis not present

## 2018-12-01 NOTE — Patient Instructions (Addendum)
Nodules are stable on CT scan I will review report on your sleep machine

## 2018-12-03 DIAGNOSIS — F418 Other specified anxiety disorders: Secondary | ICD-10-CM | POA: Diagnosis not present

## 2018-12-03 DIAGNOSIS — B9561 Methicillin susceptible Staphylococcus aureus infection as the cause of diseases classified elsewhere: Secondary | ICD-10-CM | POA: Diagnosis not present

## 2018-12-03 DIAGNOSIS — J9612 Chronic respiratory failure with hypercapnia: Secondary | ICD-10-CM | POA: Diagnosis not present

## 2018-12-03 DIAGNOSIS — I2781 Cor pulmonale (chronic): Secondary | ICD-10-CM | POA: Diagnosis not present

## 2018-12-03 DIAGNOSIS — J9690 Respiratory failure, unspecified, unspecified whether with hypoxia or hypercapnia: Secondary | ICD-10-CM | POA: Diagnosis not present

## 2018-12-03 DIAGNOSIS — E43 Unspecified severe protein-calorie malnutrition: Secondary | ICD-10-CM | POA: Diagnosis not present

## 2018-12-03 DIAGNOSIS — R7881 Bacteremia: Secondary | ICD-10-CM | POA: Diagnosis not present

## 2018-12-03 DIAGNOSIS — E890 Postprocedural hypothyroidism: Secondary | ICD-10-CM | POA: Diagnosis not present

## 2018-12-03 DIAGNOSIS — J449 Chronic obstructive pulmonary disease, unspecified: Secondary | ICD-10-CM | POA: Diagnosis not present

## 2018-12-03 NOTE — Progress Notes (Signed)
Subjective:    Patient ID: Jasmine [name], female    DOB: 08/19/1946, 73 y.o.   MRN: [id number]  HPI  73 y.oformersmoker for FU of COPD,hypoxic and hypercarbic respiratory failure, Lung nodules  She was started on O2 in march 2014 &uses 3 liters 02 continuously and w/ sleep, and 4L pulsed with activity.  She has smoked Since her 20's - 54 Pyrs. Quit 07/21/2015. Ongoingcocaine use/polysubstance abuse   Chief Complaint  Patient presents with  . Follow-up    F/U for CT results. Per patient she has been having some increased SOB.     Complains of baseline dyspnea and occasional wheezing.  Dry cough. She claims she has quit smoking.  Also denies having used cocaine since we met last. No interim hospitalizations. She claims compliance with her NIV during sleep  We reviewed CT chest today  Significant tests/ events reviewed  CT chest 10/2011 >>showed Moderate centrilobular emphysema. Small bilateral pleural effusions with dependent atelectasis  CT chest 07/2016-resolving right upper lobe consolidation,spiculated subcentimeter nodules that have been noted before with low-grade PET hypermetabolism CT chest February 2019 stable right upper lobe scarring, stable bilateral pulmonary nodules, emphysema  CT chest  11/2018>> stable nodules  PFT >FEV1 at 0.47 L( 30%), ratio 39 , No BD response (<200cc)  Review of Systems neg for any significant sore throat, dysphagia, itching, sneezing, nasal congestion or excess/ purulent secretions, fever, chills, sweats, unintended wt loss, pleuritic or exertional cp, hempoptysis, orthopnea pnd or change in chronic leg swelling. Also denies presyncope, palpitations, heartburn, abdominal pain, nausea, vomiting, diarrhea or change in bowel or urinary habits, dysuria,hematuria, rash, arthralgias, visual complaints, headache, numbness weakness or ataxia.     Objective:   Physical Exam  Gen. Pleasant, thin,, in no distress, in  wheelchair ENT - no thrush, no pallor/icterus,no post nasal drip Neck: No JVD, no thyromegaly, no carotid bruits Lungs: no use of accessory muscles, no dullness to percussion, decreased  without rales or rhonchi  Cardiovascular: Rhythm regular, heart sounds  normal, no murmurs or gallops, no peripheral edema Musculoskeletal: No deformities, no cyanosis or clubbing      Review of Systems     Objective:   Physical Exam        Assessment & Plan:   

## 2018-12-03 NOTE — Assessment & Plan Note (Signed)
Stable on CT, doubt further imaging necessary

## 2018-12-03 NOTE — Assessment & Plan Note (Signed)
She was placed on NIV after her last hospitalization. We will check compliance  and have a low threshold to stop this unless she is very compliant Continue O2 at all times

## 2018-12-03 NOTE — Assessment & Plan Note (Signed)
Continue Breo and Spiriva

## 2018-12-06 DIAGNOSIS — R911 Solitary pulmonary nodule: Secondary | ICD-10-CM | POA: Diagnosis not present

## 2018-12-06 DIAGNOSIS — J449 Chronic obstructive pulmonary disease, unspecified: Secondary | ICD-10-CM | POA: Diagnosis not present

## 2018-12-06 DIAGNOSIS — R300 Dysuria: Secondary | ICD-10-CM | POA: Diagnosis not present

## 2018-12-06 DIAGNOSIS — J9611 Chronic respiratory failure with hypoxia: Secondary | ICD-10-CM | POA: Diagnosis not present

## 2018-12-14 ENCOUNTER — Encounter: Payer: Self-pay | Admitting: Adult Health

## 2018-12-15 ENCOUNTER — Encounter: Payer: Self-pay | Admitting: Pulmonary Disease

## 2018-12-15 DIAGNOSIS — Z1211 Encounter for screening for malignant neoplasm of colon: Secondary | ICD-10-CM | POA: Diagnosis not present

## 2018-12-15 DIAGNOSIS — Z1212 Encounter for screening for malignant neoplasm of rectum: Secondary | ICD-10-CM | POA: Diagnosis not present

## 2018-12-15 NOTE — Progress Notes (Signed)
Trilogy download reviewed 11/2018 compliance is acceptable. Average pressure 18\6 cm

## 2018-12-16 ENCOUNTER — Inpatient Hospital Stay (HOSPITAL_COMMUNITY)
Admission: EM | Admit: 2018-12-16 | Discharge: 2018-12-21 | DRG: 189 | Disposition: A | Discharge status: Home / Self Care | Payer: Medicare HMO | Attending: Internal Medicine | Admitting: Internal Medicine

## 2018-12-16 ENCOUNTER — Encounter (HOSPITAL_COMMUNITY): Payer: Self-pay | Admitting: Emergency Medicine

## 2018-12-16 ENCOUNTER — Emergency Department (HOSPITAL_COMMUNITY): Payer: Medicare HMO

## 2018-12-16 ENCOUNTER — Other Ambulatory Visit: Payer: Self-pay

## 2018-12-16 DIAGNOSIS — Z8249 Family history of ischemic heart disease and other diseases of the circulatory system: Secondary | ICD-10-CM | POA: Diagnosis not present

## 2018-12-16 DIAGNOSIS — I10 Essential (primary) hypertension: Secondary | ICD-10-CM | POA: Diagnosis not present

## 2018-12-16 DIAGNOSIS — E039 Hypothyroidism, unspecified: Secondary | ICD-10-CM | POA: Diagnosis not present

## 2018-12-16 DIAGNOSIS — J9600 Acute respiratory failure, unspecified whether with hypoxia or hypercapnia: Secondary | ICD-10-CM

## 2018-12-16 DIAGNOSIS — Z825 Family history of asthma and other chronic lower respiratory diseases: Secondary | ICD-10-CM | POA: Diagnosis not present

## 2018-12-16 DIAGNOSIS — Z7982 Long term (current) use of aspirin: Secondary | ICD-10-CM | POA: Diagnosis not present

## 2018-12-16 DIAGNOSIS — R4182 Altered mental status, unspecified: Secondary | ICD-10-CM | POA: Diagnosis not present

## 2018-12-16 DIAGNOSIS — R778 Other specified abnormalities of plasma proteins: Secondary | ICD-10-CM

## 2018-12-16 DIAGNOSIS — J9692 Respiratory failure, unspecified with hypercapnia: Secondary | ICD-10-CM | POA: Diagnosis present

## 2018-12-16 DIAGNOSIS — Z85118 Personal history of other malignant neoplasm of bronchus and lung: Secondary | ICD-10-CM | POA: Diagnosis not present

## 2018-12-16 DIAGNOSIS — M810 Age-related osteoporosis without current pathological fracture: Secondary | ICD-10-CM | POA: Diagnosis present

## 2018-12-16 DIAGNOSIS — Z86718 Personal history of other venous thrombosis and embolism: Secondary | ICD-10-CM | POA: Diagnosis not present

## 2018-12-16 DIAGNOSIS — J9621 Acute and chronic respiratory failure with hypoxia: Secondary | ICD-10-CM | POA: Diagnosis present

## 2018-12-16 DIAGNOSIS — I110 Hypertensive heart disease with heart failure: Secondary | ICD-10-CM | POA: Diagnosis not present

## 2018-12-16 DIAGNOSIS — Z716 Tobacco abuse counseling: Secondary | ICD-10-CM

## 2018-12-16 DIAGNOSIS — J449 Chronic obstructive pulmonary disease, unspecified: Secondary | ICD-10-CM | POA: Diagnosis not present

## 2018-12-16 DIAGNOSIS — F17210 Nicotine dependence, cigarettes, uncomplicated: Secondary | ICD-10-CM | POA: Diagnosis present

## 2018-12-16 DIAGNOSIS — E785 Hyperlipidemia, unspecified: Secondary | ICD-10-CM | POA: Diagnosis not present

## 2018-12-16 DIAGNOSIS — J432 Centrilobular emphysema: Secondary | ICD-10-CM | POA: Diagnosis not present

## 2018-12-16 DIAGNOSIS — I214 Non-ST elevation (NSTEMI) myocardial infarction: Secondary | ICD-10-CM | POA: Diagnosis not present

## 2018-12-16 DIAGNOSIS — R410 Disorientation, unspecified: Secondary | ICD-10-CM | POA: Diagnosis not present

## 2018-12-16 DIAGNOSIS — F1410 Cocaine abuse, uncomplicated: Secondary | ICD-10-CM | POA: Diagnosis present

## 2018-12-16 DIAGNOSIS — J9690 Respiratory failure, unspecified, unspecified whether with hypoxia or hypercapnia: Secondary | ICD-10-CM | POA: Diagnosis present

## 2018-12-16 DIAGNOSIS — J9612 Chronic respiratory failure with hypercapnia: Secondary | ICD-10-CM | POA: Diagnosis not present

## 2018-12-16 DIAGNOSIS — Z87891 Personal history of nicotine dependence: Secondary | ICD-10-CM | POA: Diagnosis present

## 2018-12-16 DIAGNOSIS — F418 Other specified anxiety disorders: Secondary | ICD-10-CM | POA: Diagnosis present

## 2018-12-16 DIAGNOSIS — J329 Chronic sinusitis, unspecified: Secondary | ICD-10-CM | POA: Diagnosis present

## 2018-12-16 DIAGNOSIS — Z801 Family history of malignant neoplasm of trachea, bronchus and lung: Secondary | ICD-10-CM

## 2018-12-16 DIAGNOSIS — Z888 Allergy status to other drugs, medicaments and biological substances status: Secondary | ICD-10-CM

## 2018-12-16 DIAGNOSIS — Z720 Tobacco use: Secondary | ICD-10-CM | POA: Diagnosis not present

## 2018-12-16 DIAGNOSIS — J309 Allergic rhinitis, unspecified: Secondary | ICD-10-CM | POA: Diagnosis present

## 2018-12-16 DIAGNOSIS — R0602 Shortness of breath: Secondary | ICD-10-CM | POA: Diagnosis not present

## 2018-12-16 DIAGNOSIS — R404 Transient alteration of awareness: Secondary | ICD-10-CM | POA: Diagnosis not present

## 2018-12-16 DIAGNOSIS — I5032 Chronic diastolic (congestive) heart failure: Secondary | ICD-10-CM | POA: Diagnosis present

## 2018-12-16 DIAGNOSIS — Z9981 Dependence on supplemental oxygen: Secondary | ICD-10-CM | POA: Diagnosis not present

## 2018-12-16 DIAGNOSIS — R7989 Other specified abnormal findings of blood chemistry: Secondary | ICD-10-CM | POA: Diagnosis not present

## 2018-12-16 DIAGNOSIS — J9622 Acute and chronic respiratory failure with hypercapnia: Secondary | ICD-10-CM | POA: Diagnosis not present

## 2018-12-16 DIAGNOSIS — F329 Major depressive disorder, single episode, unspecified: Secondary | ICD-10-CM | POA: Diagnosis not present

## 2018-12-16 DIAGNOSIS — R0902 Hypoxemia: Secondary | ICD-10-CM | POA: Diagnosis not present

## 2018-12-16 DIAGNOSIS — Z9119 Patient's noncompliance with other medical treatment and regimen: Secondary | ICD-10-CM

## 2018-12-16 DIAGNOSIS — Z7951 Long term (current) use of inhaled steroids: Secondary | ICD-10-CM

## 2018-12-16 DIAGNOSIS — Z79890 Hormone replacement therapy (postmenopausal): Secondary | ICD-10-CM

## 2018-12-16 DIAGNOSIS — I2781 Cor pulmonale (chronic): Secondary | ICD-10-CM

## 2018-12-16 DIAGNOSIS — R0600 Dyspnea, unspecified: Secondary | ICD-10-CM | POA: Diagnosis not present

## 2018-12-16 DIAGNOSIS — J9601 Acute respiratory failure with hypoxia: Secondary | ICD-10-CM

## 2018-12-16 DIAGNOSIS — Z90722 Acquired absence of ovaries, bilateral: Secondary | ICD-10-CM

## 2018-12-16 DIAGNOSIS — Z79899 Other long term (current) drug therapy: Secondary | ICD-10-CM

## 2018-12-16 LAB — POCT I-STAT 7, (LYTES, BLD GAS, ICA,H+H)
Acid-Base Excess: 13 mmol/L — ABNORMAL HIGH (ref 0.0–2.0)
BICARBONATE: 45.2 mmol/L — AB (ref 20.0–28.0)
Calcium, Ion: 1.24 mmol/L (ref 1.15–1.40)
HCT: 47 % — ABNORMAL HIGH (ref 36.0–46.0)
Hemoglobin: 16 g/dL — ABNORMAL HIGH (ref 12.0–15.0)
O2 Saturation: 87 %
Potassium: 4 mmol/L (ref 3.5–5.1)
Sodium: 137 mmol/L (ref 135–145)
TCO2: 48 mmol/L — ABNORMAL HIGH (ref 22–32)
pCO2 arterial: 95.1 mmHg (ref 32.0–48.0)
pH, Arterial: 7.285 — ABNORMAL LOW (ref 7.350–7.450)
pO2, Arterial: 65 mmHg — ABNORMAL LOW (ref 83.0–108.0)

## 2018-12-16 LAB — TROPONIN I
Troponin I: <0.03 ng/mL (ref <0.03)
Troponin I: <0.03 ng/mL (ref <0.03)
Troponin I: <0.03 ng/mL (ref <0.03)

## 2018-12-16 LAB — BASIC METABOLIC PANEL
Anion gap: 11 (ref 5–15)
BUN: 9 mg/dL (ref 8–23)
CHLORIDE: 93 mmol/L — AB (ref 98–111)
CO2: 35 mmol/L — ABNORMAL HIGH (ref 22–32)
CREATININE: 0.8 mg/dL (ref 0.44–1.00)
Calcium: 8.8 mg/dL — ABNORMAL LOW (ref 8.9–10.3)
GFR calc Af Amer: >60 mL/min (ref >60)
GFR calc non Af Amer: >60 mL/min (ref >60)
Glucose, Bld: 112 mg/dL — ABNORMAL HIGH (ref 70–99)
Potassium: 4.1 mmol/L (ref 3.5–5.1)
Sodium: 139 mmol/L (ref 135–145)

## 2018-12-16 LAB — CBC WITH DIFFERENTIAL/PLATELET
Abs Immature Granulocytes: 0.02 10*3/uL (ref 0.00–0.07)
Basophils Absolute: 0 10*3/uL (ref 0.0–0.1)
Basophils Relative: 1 %
Eosinophils Absolute: 0.1 10*3/uL (ref 0.0–0.5)
Eosinophils Relative: 2 %
HCT: 50.6 % — ABNORMAL HIGH (ref 36.0–46.0)
Hemoglobin: 14.6 g/dL (ref 12.0–15.0)
Immature Granulocytes: 0 %
LYMPHS ABS: 0.7 10*3/uL (ref 0.7–4.0)
Lymphocytes Relative: 14 %
MCH: 29.4 pg (ref 26.0–34.0)
MCHC: 28.9 g/dL — AB (ref 30.0–36.0)
MCV: 102 fL — ABNORMAL HIGH (ref 80.0–100.0)
Monocytes Absolute: 0.5 10*3/uL (ref 0.1–1.0)
Monocytes Relative: 11 %
Neutro Abs: 3.4 10*3/uL (ref 1.7–7.7)
Neutrophils Relative %: 72 %
Platelets: 180 10*3/uL (ref 150–400)
RBC: 4.96 MIL/uL (ref 3.87–5.11)
RDW: 14.8 % (ref 11.5–15.5)
WBC: 4.7 10*3/uL (ref 4.0–10.5)
nRBC: 0 % (ref 0.0–0.2)

## 2018-12-16 LAB — I-STAT TROPONIN, ED: Troponin i, poc: 0.22 ng/mL (ref 0.00–0.08)

## 2018-12-16 LAB — RAPID URINE DRUG SCREEN, HOSP PERFORMED
AMPHETAMINES: NOT DETECTED
Barbiturates: NOT DETECTED
Benzodiazepines: NOT DETECTED
Cocaine: NOT DETECTED
Opiates: NOT DETECTED
TETRAHYDROCANNABINOL: NOT DETECTED

## 2018-12-16 LAB — MRSA PCR SCREENING: MRSA by PCR: NEGATIVE

## 2018-12-16 LAB — BRAIN NATRIURETIC PEPTIDE: B Natriuretic Peptide: 72.1 pg/mL (ref 0.0–100.0)

## 2018-12-16 MED ORDER — METHYLPREDNISOLONE SODIUM SUCC 125 MG IJ SOLR
125.0000 mg | Freq: Once | INTRAMUSCULAR | Status: AC
  Administered 2018-12-16: 125 mg via INTRAVENOUS
  Filled 2018-12-16: qty 2

## 2018-12-16 MED ORDER — METHYLPREDNISOLONE SODIUM SUCC 125 MG IJ SOLR
60.0000 mg | Freq: Four times a day (QID) | INTRAMUSCULAR | Status: DC
  Administered 2018-12-16 – 2018-12-17 (×5): 60 mg via INTRAVENOUS
  Filled 2018-12-16 (×5): qty 2

## 2018-12-16 MED ORDER — SODIUM CHLORIDE 0.9% FLUSH: 3.0000 mL | INTRAVENOUS | Status: DC | PRN

## 2018-12-16 MED ORDER — FUROSEMIDE 20 MG PO TABS
20.0000 mg | ORAL_TABLET | Freq: Every day | ORAL | Status: DC
  Administered 2018-12-17 – 2018-12-21 (×5): 20 mg via ORAL
  Filled 2018-12-16 (×5): qty 1

## 2018-12-16 MED ORDER — SODIUM CHLORIDE 0.9 % IV SOLN: 250.0000 mL | INTRAVENOUS | Status: DC | PRN

## 2018-12-16 MED ORDER — ASPIRIN 81 MG PO CHEW
324.0000 mg | CHEWABLE_TABLET | Freq: Once | ORAL | Status: DC
  Filled 2018-12-16: qty 4

## 2018-12-16 MED ORDER — ACETAMINOPHEN 325 MG PO TABS: 650.0000 mg | ORAL_TABLET | Freq: Four times a day (QID) | ORAL | Status: DC | PRN

## 2018-12-16 MED ORDER — FLUTICASONE FUROATE-VILANTEROL 200-25 MCG/INH IN AEPB
1.0000 | INHALATION_SPRAY | Freq: Every day | RESPIRATORY_TRACT | Status: DC
  Filled 2018-12-16: qty 28

## 2018-12-16 MED ORDER — ALBUTEROL SULFATE (2.5 MG/3ML) 0.083% IN NEBU
2.5000 mg | INHALATION_SOLUTION | Freq: Once | RESPIRATORY_TRACT | Status: AC
  Administered 2018-12-16: 2.5 mg via RESPIRATORY_TRACT
  Filled 2018-12-16: qty 3

## 2018-12-16 MED ORDER — IPRATROPIUM-ALBUTEROL 0.5-2.5 (3) MG/3ML IN SOLN
3.0000 mL | Freq: Once | RESPIRATORY_TRACT | Status: AC
  Administered 2018-12-16: 3 mL via RESPIRATORY_TRACT
  Filled 2018-12-16: qty 3

## 2018-12-16 MED ORDER — LEVOTHYROXINE SODIUM 75 MCG PO TABS
75.0000 ug | ORAL_TABLET | Freq: Every day | ORAL | Status: DC
  Administered 2018-12-16 – 2018-12-21 (×6): 75 ug via ORAL
  Filled 2018-12-16 (×6): qty 1

## 2018-12-16 MED ORDER — FLUTICASONE FUROATE-VILANTEROL 200-25 MCG/INH IN AEPB
1.0000 | INHALATION_SPRAY | Freq: Every day | RESPIRATORY_TRACT | Status: DC
  Administered 2018-12-17 – 2018-12-21 (×3): 1 via RESPIRATORY_TRACT
  Filled 2018-12-16: qty 28

## 2018-12-16 MED ORDER — ALBUTEROL SULFATE (2.5 MG/3ML) 0.083% IN NEBU: 2.5000 mg | INHALATION_SOLUTION | RESPIRATORY_TRACT | Status: DC | PRN

## 2018-12-16 MED ORDER — SODIUM CHLORIDE 0.9% FLUSH
3.0000 mL | Freq: Two times a day (BID) | INTRAVENOUS | Status: DC
  Administered 2018-12-16 – 2018-12-21 (×10): 3 mL via INTRAVENOUS

## 2018-12-16 MED ORDER — CITALOPRAM HYDROBROMIDE 20 MG PO TABS
20.0000 mg | ORAL_TABLET | Freq: Every day | ORAL | Status: DC
  Administered 2018-12-16 – 2018-12-21 (×6): 20 mg via ORAL
  Filled 2018-12-16 (×6): qty 1

## 2018-12-16 MED ORDER — ALBUTEROL SULFATE (2.5 MG/3ML) 0.083% IN NEBU
2.5000 mg | INHALATION_SOLUTION | Freq: Four times a day (QID) | RESPIRATORY_TRACT | Status: DC
  Administered 2018-12-16: 2.5 mg via RESPIRATORY_TRACT
  Filled 2018-12-16: qty 3

## 2018-12-16 MED ORDER — ACETAMINOPHEN 650 MG RE SUPP: 650.0000 mg | Freq: Four times a day (QID) | RECTAL | Status: DC | PRN

## 2018-12-16 MED ORDER — IPRATROPIUM BROMIDE 0.02 % IN SOLN
0.5000 mg | Freq: Four times a day (QID) | RESPIRATORY_TRACT | Status: DC
  Administered 2018-12-16: 0.5 mg via RESPIRATORY_TRACT
  Filled 2018-12-16: qty 2.5

## 2018-12-16 MED ORDER — ASPIRIN EC 81 MG PO TBEC
81.0000 mg | DELAYED_RELEASE_TABLET | Freq: Every day | ORAL | Status: DC
  Administered 2018-12-17 – 2018-12-21 (×5): 81 mg via ORAL
  Filled 2018-12-16 (×5): qty 1

## 2018-12-16 MED ORDER — ENOXAPARIN SODIUM 40 MG/0.4ML ~~LOC~~ SOLN
40.0000 mg | SUBCUTANEOUS | Status: DC
  Administered 2018-12-16 – 2018-12-19 (×4): 40 mg via SUBCUTANEOUS
  Filled 2018-12-16 (×5): qty 0.4

## 2018-12-16 MED ORDER — TIOTROPIUM BROMIDE MONOHYDRATE 18 MCG IN CAPS: 18.0000 ug | ORAL_CAPSULE | Freq: Every day | RESPIRATORY_TRACT | Status: DC

## 2018-12-16 NOTE — Progress Notes (Signed)
12/16/2017 Patient transfer from the emergency room to 2W at 1555. She was on bipap. She was eventually taken off bipap placed on 3 Liters [place]. Patient skin intact, receive MRSA and CHG bath and was placed on stepdown monitor [facility] RN.

## 2018-12-16 NOTE — Progress Notes (Signed)
Patient transported from ED to 2W22 on Bipap. Upon arrival placed on 3L [place] like she wears at home, Patient is alert & oriented & doesn't feel like she needs to wear the bipap anymore. Informed RN to call if patient has any distress or starts getting confused & we will place back on bipap.  Kathie Dike RRT

## 2018-12-16 NOTE — ED Notes (Signed)
Pt back from CT

## 2018-12-16 NOTE — ED Notes (Signed)
Portable xray at bedside.

## 2018-12-16 NOTE — H&P (Addendum)
[facility]                                                                                    Patient Demographics  Jasmine [name], is a 73 y.o. female  CSN: [id number]  MRN: [id number]  DOB - [birth year]  Admit Date - 12/16/2018  Outpatient Primary MD for the patient is Biagio Borg, MD   With History of -  Past Medical History:  Diagnosis Date  . ALLERGIC RHINITIS 05/01/2007  . Anxiety   . Arthritis    "hands" (12/28/2015)  . ASTHMA 05/01/2007   "since I was a child"  . Cancer (HCC)    LUNG  . CHF (congestive heart failure) ([place])   . Chronic bronchitis ([place])   . COPD (chronic obstructive pulmonary disease) ([place])   . DEPRESSION 07/21/2008  . DVT (deep venous thrombosis) (HCC) 1960s   LLE  . FATIGUE 10/10/2010  . HYPERTENSION 05/01/2007  . HYPERTHYROIDISM 11/23/2007   Pt endorses having had Graves disease, possibly radioactive iodine x1, but no thyroidectomy  . HYPOTHYROIDISM 08/23/2009  . LUMBAR RADICULOPATHY, RIGHT 08/25/2008  . On home oxygen therapy    "3-4L; qd; all the time" (12/28/2015)  . OSTEOPOROSIS 05/01/2007  . SHOULDER PAIN, LEFT 08/23/2009  . SINUSITIS, CHRONIC 10/10/2010      Past Surgical History:  Procedure Laterality Date  . APPENDECTOMY    . DILATION AND CURETTAGE OF UTERUS  multiple   history of multiple dialations and curettages and miscarriages, unfortunately never carrying a child to term  . ECTOPIC PREGNANCY SURGERY  "late '60s or early '70s  . ELECTROCARDIOGRAM  06/20/2006  . OOPHORECTOMY Right     in for   Chief Complaint  Patient presents with  . Altered Mental Status  . Shortness of Breath     HPI  Jasmine [name]  is a 73 y.o. female, with with past medical history significant for  asthma, COPD on home oxygen, history of diastolic congestive heart failure with last echo done on 12/2015 presenting with hypoxemia and altered mental status.  Her husband saw her at home and she was lying in bed and basically unresponsive.  Upon  arrival EMS noted hypoxemia with pulse ox in the 80s.  She was given neb treatments and transferred to our emergency room. When I saw the patient she had a BiPAP on and could not give significant history. Work-up in the emergency room PO2 of 65 with a CO2 of 48.  Her BMP was normal and her CBC showed a white blood cell count of 4.7 and a hemoglobin of 14.6.  Noted that her troponin was elevated at 0.22.    Review of Systems    Could not obtain due to patient's condition  Social History Social History   Tobacco Use  . Smoking status: Former Smoker    Packs/day: 1.00    Years: 54.00    Pack years: 54.00    Types: Cigarettes    Last attempt to quit: 07/21/2015    Years since quitting: 3.4  . Smokeless tobacco: Never Used  Substance Use Topics  . Alcohol use: Yes    Alcohol/week: 2.0 standard drinks  Types: 2 Standard drinks or equivalent per week    Comment: wine coolers     Family History Family History  Problem Relation Age of Onset  . Lung cancer Father   . Alcohol abuse Brother   . Diabetes Brother   . Hypertension Other   . Stroke Mother   . Thyroid disease Mother   . Asthma Other        maternal aunts  . Breast cancer Neg Hx      Prior to Admission medications   Medication Sig Start Date End Date Taking? Authorizing Provider  albuterol (VENTOLIN HFA) 108 (90 Base) MCG/ACT inhaler Inhale 2 puffs into the lungs every 6 (six) hours as needed for wheezing or shortness of breath.   Yes [provider]  atorvastatin (LIPITOR) 10 MG tablet Take 1 tablet (10 mg total) by mouth daily. 09/20/18 09/20/19 Yes Biagio Borg, MD  citalopram (CELEXA) 20 MG tablet Take 1 tablet (20 mg total) by mouth daily. 06/16/18  Yes Biagio Borg, MD  levothyroxine (SYNTHROID, LEVOTHROID) 75 MCG tablet TAKE 1 TABLET BY MOUTH DAILY BEFORE BREAKFAST. Patient taking differently: Take 75 mcg by mouth daily before breakfast.  10/29/18  Yes Biagio Borg, MD  potassium chloride (K-DUR) 10  MEQ tablet Take 1 tablet (10 mEq total) by mouth daily. 09/22/18  Yes Biagio Borg, MD  aspirin EC 81 MG EC tablet Take 1 tablet (81 mg total) by mouth daily. 03/27/16   Velvet Bathe, MD  fluticasone furoate-vilanterol (BREO ELLIPTA) 200-25 MCG/INH AEPB Inhale 1 puff into the lungs daily. 05/28/18   Robbie Lis, MD  furosemide (LASIX) 40 MG tablet 1 tab by mouth in the AM daily, with 1 tab in the PM as needed for persistent swelling 06/16/18   Biagio Borg, MD  ipratropium-albuterol (DUONEB) 0.5-2.5 (3) MG/3ML SOLN Take 3 mLs by nebulization every 6 (six) hours as needed.    [provider]  OXYGEN Inhale 3 L into the lungs. 24/7    [provider]  [place]    [provider]  tiotropium (SPIRIVA) 18 MCG inhalation capsule Place 18 mcg into inhaler and inhale daily.    [provider]  traMADol (ULTRAM) 50 MG tablet Take 1 tablet (50 mg total) by mouth 2 (two) times daily as needed. 04/30/18   Biagio Borg, MD    Allergies  Allergen Reactions  . Alendronate Sodium Other (See Comments)    Patient does not remember this reaction    Physical Exam  Vitals  Blood pressure 119/65, pulse 70, temperature 97.6 F (36.4 C), temperature source Oral, resp. rate 18, SpO2 98 %.   1. General well-developed, well-nourished confused on BiPAP  2. Normal affect and insight, Not Suicidal or Homicidal, drowsy/confused  3. No F.N deficits, grossly, patient moving all extremities but could not perform completely due to her altered mental status.  4. Ears and Eyes appear Normal, Conjunctivae clear, PERRLA. Moist Oral Mucosa.  5. Supple Neck, No JVD, No cervical lymphadenopathy appriciated, No Carotid Bruits.  6. Symmetrical Chest wall movement, decreased breath sounds bilaterally.  7. RRR, No Gallops, Rubs or Murmurs, No Parasternal Heave.  8. Positive Bowel Sounds, Abdomen Soft, Non tender, No organomegaly appriciated,No rebound -guarding or  rigidity.  9.  No Cyanosis, Normal Skin Turgor, No Skin Rash or Bruise.  10. Good muscle tone,  joints appear normal , no effusions, Normal ROM.    Data Review  CBC Recent Labs  Lab 12/16/18 1120 12/16/18 1251  WBC 4.7  --   HGB 14.6 16.0*  HCT 50.6* 47.0*  PLT 180  --   MCV 102.0*  --   MCH 29.4  --   MCHC 28.9*  --   RDW 14.8  --   LYMPHSABS 0.7  --   MONOABS 0.5  --   EOSABS 0.1  --   BASOSABS 0.0  --    ------------------------------------------------------------------------------------------------------------------  Chemistries  Recent Labs  Lab 12/16/18 1120 12/16/18 1251  NA 139 137  K 4.1 4.0  CL 93*  --   CO2 35*  --   GLUCOSE 112*  --   BUN 9  --   CREATININE 0.80  --   CALCIUM 8.8*  --    ------------------------------------------------------------------------------------------------------------------ CrCl cannot be calculated (Unknown ideal weight.). ------------------------------------------------------------------------------------------------------------------ No results for input(s): TSH, T4TOTAL, T3FREE, THYROIDAB in the last 72 hours.  Invalid input(s): FREET3   Coagulation profile No results for input(s): INR, PROTIME in the last 168 hours. ------------------------------------------------------------------------------------------------------------------- No results for input(s): DDIMER in the last 72 hours. -------------------------------------------------------------------------------------------------------------------  Cardiac Enzymes No results for input(s): CKMB, TROPONINI, MYOGLOBIN in the last 168 hours.  Invalid input(s): CK ------------------------------------------------------------------------------------------------------------------ Invalid input(s): POCBNP   ---------------------------------------------------------------------------------------------------------------  Urinalysis    Component Value Date/Time    COLORURINE YELLOW 09/20/2018 1426   APPEARANCEUR CLEAR 09/20/2018 1426   LABSPEC 1.010 09/20/2018 1426   PHURINE 7.0 09/20/2018 1426   GLUCOSEU NEGATIVE 09/20/2018 1426   HGBUR NEGATIVE 09/20/2018 1426   BILIRUBINUR NEGATIVE 09/20/2018 1426   BILIRUBINUR negative 08/11/2011 1358   KETONESUR NEGATIVE 09/20/2018 1426   PROTEINUR 30 (A) 03/24/2018 1925   UROBILINOGEN 0.2 09/20/2018 1426   NITRITE NEGATIVE 09/20/2018 1426   LEUKOCYTESUR NEGATIVE 09/20/2018 1426    ----------------------------------------------------------------------------------------------------------------   Imaging results:   Ct Head Wo Contrast  Result Date: 12/16/2018 CLINICAL DATA:  Altered mental status EXAM: CT HEAD WITHOUT CONTRAST TECHNIQUE: Contiguous axial images were obtained from the base of the skull through the vertex without intravenous contrast. COMPARISON:  March 24, 2018 FINDINGS: Brain: The ventricles and sulci appear within normal limits for age and stable. There is no intracranial mass, hemorrhage, extra-axial fluid collection, or midline shift. Brain parenchyma appears unremarkable. No evident acute infarct. Vascular: No hyperdense vessel. There is calcification in each carotid siphon region. Skull: The bony calvarium appears intact. Ossification along the anterior falx is stable. Sinuses/Orbits: There is mucosal thickening in several ethmoid air cells. There is slight mucosal thickening in each anterior sphenoid sinus. Other visualized paranasal sinuses are clear. Orbits appear symmetric bilaterally. Other: Mastoid air cells are clear. IMPRESSION: Brain parenchyma appears unremarkable.  No mass or hemorrhage. There are foci of arterial vascular calcification. There is mild paranasal sinus disease. Electronically Signed   By: Lowella Grip III M.D.   On: 12/16/2018 12:24   Ct Chest Wo Contrast  Result Date: 11/24/2018 CLINICAL DATA:  Follow-up lung nodule EXAM: CT CHEST WITHOUT CONTRAST TECHNIQUE:  Multidetector CT imaging of the chest was performed following the standard protocol without IV contrast. COMPARISON:  12/16/2017 FINDINGS: Cardiovascular: Three-vessel coronary artery calcifications. Normal heart size. No pericardial effusion. Mediastinum/Nodes: No enlarged mediastinal, hilar, or axillary lymph nodes. Thyroid gland, trachea, and esophagus demonstrate no significant findings. Lungs/Pleura: Moderate centrilobular emphysema. Multiple stable small pulmonary nodules of the upper lobes bilaterally. Unchanged bandlike scarring of the inferior right upper lobe. No pleural effusion or pneumothorax. Upper Abdomen: No acute abnormality. Musculoskeletal: No chest wall mass or suspicious bone lesions identified. IMPRESSION:  1. Stable small pulmonary nodules, likely benign sequelae of prior infection. No further routine CT follow-up is required for these nodules. 2.  Emphysema. 3.  Coronary artery disease. Electronically Signed   By: Eddie Candle M.D.   On: 11/24/2018 13:55   Dg Chest Portable 1 View  Result Date: 12/16/2018 CLINICAL DATA:  Shortness of breath. EXAM: PORTABLE CHEST 1 VIEW COMPARISON:  Chest CT 11/24/2018 and chest x-ray 05/25/2018 FINDINGS: The heart is mildly enlarged but stable. There is tortuosity and calcification of the thoracic aorta. Stable emphysematous changes and pulmonary scarring. No acute infiltrates, edema or effusions. No worrisome pulmonary lesions. Stable dense linear scarring changes in the right upper lobe. The bony thorax is intact. IMPRESSION: Chronic emphysematous and scarring changes but no acute overlying pulmonary process. Electronically Signed   By: Marijo Sanes M.D.   On: 12/16/2018 11:48    My personal review of EKG: Normal sinus rhythm at night with left ventricular hypertrophy  Assessment & Plan  Acute respiratory failure with hypoxemia History of noncompliance with the CPAP Continue with BiPAP Nebulizer treatments  ACS Check echocardiogram and  serial troponins Consult cardiology if needed  Altered mental status CT of the brain was negative Check MRI if not improving.  Check urine drug screen  History of substance abuse/cocaine  Hypothyroidism continue with Synthroid  Hyperlipidemia, continue with Lipitor       DVT Prophylaxis Lovenox  AM Labs Ordered, also please review Full Orders    Code Status full  Disposition Plan: To be determined  Time spent in minutes : 41 min  Condition GUARDED   @SIGNATURE @

## 2018-12-16 NOTE — Progress Notes (Signed)
Pan[facility] ABG results given to PA.  Pt was then immediately placed on bibap.  Pt tol well, pt states she is comfortable on bipap.  VSS

## 2018-12-16 NOTE — ED Provider Notes (Signed)
[facility] EMERGENCY DEPARTMENT Provider Note   CSN: [id number] Arrival date & time: 12/16/18  1050    History   Chief Complaint Chief Complaint  Patient presents with  . Altered Mental Status  . Shortness of Breath    HPI Jasmine [name] is a 73 y.o. female.     HPI   Patient is a 73 year old female with a history of asthma, lung cancer, CHF, COPD, hypertension, who presents the emergency department today for altered mental status and hypoxia.  Patient presents the via EMS and per their report, patient was found by her husband this morning laying supine and appeared altered. she normally has the bed elevated and wears a CPAP at night.  Upon, EMS arrival patient was satting in the 80s on room air on 2 L.  She wears O2 chronically.  She was given albuterol treatment by EMS prior to arrival.  On exam patient appears somewhat confused so history is slightly difficult to obtain.  She currently denies any chest pain.  She currently denies any shortness of breath, she states that she has been getting more short of breath when she uses her portable oxygen tank at home and has to walk with it.  Notes that it is very heavy.  She has had no significant lower extremity swelling.  She is not had any fevers or increase in her cough.  Level 5 caveat given patient's mental status.  Past Medical History:  Diagnosis Date  . ALLERGIC RHINITIS 05/01/2007  . Anxiety   . Arthritis    "hands" (12/28/2015)  . ASTHMA 05/01/2007   "since I was a child"  . Cancer (HCC)    LUNG  . CHF (congestive heart failure) ([place])   . Chronic bronchitis ([place])   . COPD (chronic obstructive pulmonary disease) ([place])   . DEPRESSION 07/21/2008  . DVT (deep venous thrombosis) (HCC) 1960s   LLE  . FATIGUE 10/10/2010  . HYPERTENSION 05/01/2007  . HYPERTHYROIDISM 11/23/2007   Pt endorses having had Graves disease, possibly radioactive iodine x1, but no thyroidectomy  . HYPOTHYROIDISM 08/23/2009  . LUMBAR  RADICULOPATHY, RIGHT 08/25/2008  . On home oxygen therapy    "3-4L; qd; all the time" (12/28/2015)  . OSTEOPOROSIS 05/01/2007  . SHOULDER PAIN, LEFT 08/23/2009  . SINUSITIS, CHRONIC 10/10/2010    Patient Active Problem List   Diagnosis Date Noted  . Respiratory failure ([place]) 12/16/2018  . Preventative health care 09/20/2018  . Edema due to congestive heart failure ([place]) 07/27/2018  . Palliative care by specialist   . Shortness of breath   . Chronic low back pain 04/30/2018  . COPD with acute exacerbation ([place])   . Hypothyroidism   . Anxiety   . Respiratory failure with hypercapnia ([place]) 03/24/2018  . Hypercarbia 03/22/2018  . Polysubstance abuse ([place]) 01/26/2018  . Aspiration into airway 06/03/2017  . Rash 07/12/2016  . Bilateral hearing loss 06/05/2016  . Hyperglycemia 05/21/2016  . Abnormal LFTs 03/24/2016  . Elevated blood sugar 03/24/2016  . Solitary pulmonary nodule 01/09/2016  . Chronic respiratory failure with hypoxia ([place])   . On home oxygen therapy   . Chronic diastolic congestive heart failure ([place])   . Bacteremia Step viridans 12/27/2015  . Tobacco abuse disorder 12/25/2015  . Hypothyroidism following radioiodine therapy 09/26/2014  . Protein-calorie malnutrition, severe ([place]) 06/23/2014  . Cor pulmonale ([place]) 01/12/2013  . COPD (chronic obstructive pulmonary disease) ([place]) 11/29/2011  . Macrocytosis without anemia 11/09/2011  . Palliative care encounter 04/09/2011  .  SINUSITIS, CHRONIC 10/10/2010  . FATIGUE 10/10/2010  . Depression with anxiety 07/21/2008  . Essential hypertension 05/01/2007  . ALLERGIC RHINITIS 05/01/2007  . ASTHMA 05/01/2007  . OSTEOPOROSIS 05/01/2007    Past Surgical History:  Procedure Laterality Date  . APPENDECTOMY    . DILATION AND CURETTAGE OF UTERUS  multiple   history of multiple dialations and curettages and miscarriages, unfortunately never carrying a child to term  . ECTOPIC PREGNANCY SURGERY  "late '60s or early '70s  .  ELECTROCARDIOGRAM  06/20/2006  . OOPHORECTOMY Right      OB History    Gravida  5   Para      Term      Preterm      AB  5   Living        SAB  4   TAB      Ectopic  1   Multiple      Live Births               Home Medications    Prior to Admission medications   Medication Sig Start Date End Date Taking? Authorizing Provider  albuterol (VENTOLIN HFA) 108 (90 Base) MCG/ACT inhaler Inhale 2 puffs into the lungs every 6 (six) hours as needed for wheezing or shortness of breath.   Yes [provider]  aspirin EC 81 MG EC tablet Take 1 tablet (81 mg total) by mouth daily. 03/27/16  Yes Velvet Bathe, MD  atorvastatin (LIPITOR) 10 MG tablet Take 1 tablet (10 mg total) by mouth daily. 09/20/18 09/20/19 Yes Biagio Borg, MD  citalopram (CELEXA) 20 MG tablet Take 1 tablet (20 mg total) by mouth daily. 06/16/18  Yes Biagio Borg, MD  fluticasone furoate-vilanterol (BREO ELLIPTA) 200-25 MCG/INH AEPB Inhale 1 puff into the lungs daily. 05/28/18  Yes Robbie Lis, MD  furosemide (LASIX) 40 MG tablet 1 tab by mouth in the AM daily, with 1 tab in the PM as needed for persistent swelling 06/16/18  Yes Biagio Borg, MD  ipratropium-albuterol (DUONEB) 0.5-2.5 (3) MG/3ML SOLN Take 3 mLs by nebulization every 6 (six) hours as needed.   Yes [provider]  levothyroxine (SYNTHROID, LEVOTHROID) 75 MCG tablet TAKE 1 TABLET BY MOUTH DAILY BEFORE BREAKFAST. Patient taking differently: Take 75 mcg by mouth daily before breakfast.  10/29/18  Yes Biagio Borg, MD  OXYGEN Inhale 3 L into the lungs. 24/7   Yes [provider]  potassium chloride (K-DUR) 10 MEQ tablet Take 1 tablet (10 mEq total) by mouth daily. 09/22/18  Yes Biagio Borg, MD  [place]   Yes [provider]  tiotropium (SPIRIVA) 18 MCG inhalation capsule Place 18 mcg into inhaler and inhale daily.   Yes [provider]  traMADol (ULTRAM) 50 MG tablet Take 1 tablet (50  mg total) by mouth 2 (two) times daily as needed. 04/30/18  Yes Biagio Borg, MD    Family History Family History  Problem Relation Age of Onset  . Lung cancer Father   . Alcohol abuse Brother   . Diabetes Brother   . Hypertension Other   . Stroke Mother   . Thyroid disease Mother   . Asthma Other        maternal aunts  . Breast cancer Neg Hx     Social History Social History   Tobacco Use  . Smoking status: Former Smoker    Packs/day: 1.00    Years: 54.00  Pack years: 54.00    Types: Cigarettes    Last attempt to quit: 07/21/2015    Years since quitting: 3.4  . Smokeless tobacco: Never Used  Substance Use Topics  . Alcohol use: Yes    Alcohol/week: 2.0 standard drinks    Types: 2 Standard drinks or equivalent per week    Comment: wine coolers  . Drug use: Not Currently     Allergies   Alendronate sodium   Review of Systems Review of Systems  Constitutional: Negative for fever.  Respiratory: Positive for shortness of breath. Negative for cough.   Cardiovascular: Negative for chest pain and leg swelling.     Physical Exam Updated Vital Signs BP 129/67 (BP Location: Right Arm)   Pulse 86   Temp 99 F (37.2 C) (Oral)   Resp 18   SpO2 92%   Physical Exam Vitals signs and nursing note reviewed.  Constitutional:      General: She is not in acute distress.    Appearance: She is well-developed.  HENT:     Head: Normocephalic and atraumatic.  Eyes:     Conjunctiva/sclera: Conjunctivae normal.  Neck:     Musculoskeletal: Neck supple.  Cardiovascular:     Rate and Rhythm: Normal rate and regular rhythm.     Heart sounds: No murmur.  Pulmonary:     Effort: Pulmonary effort is normal.     Breath sounds: Examination of the right-upper field reveals decreased breath sounds. Examination of the left-upper field reveals decreased breath sounds. Examination of the right-middle field reveals decreased breath sounds. Examination of the left-middle field reveals  decreased breath sounds. Examination of the right-lower field reveals decreased breath sounds. Examination of the left-lower field reveals decreased breath sounds. Decreased breath sounds present.  Abdominal:     Palpations: Abdomen is soft.     Tenderness: There is no abdominal tenderness.  Musculoskeletal:     Right lower leg: She exhibits no tenderness.     Left lower leg: She exhibits no tenderness.  Skin:    General: Skin is warm and dry.  Neurological:     Mental Status: She is alert.     Comments: Mental Status:  Patient falls asleep when not talking but is very easily arousable and alert with any attempted conversation.  She does answer most questions appropriately.  She intermittently will not answer questions and fall asleep but wakes up easily.  She is oriented x4.  She is able to follow commands.  No slurred speech.   Cranial Nerves:  II:  pupils equal, round, reactive to light III,IV, VI: ptosis not present, extra-ocular motions intact bilaterally  V,VII: smile symmetric, facial light touch sensation equal VIII: hearing grossly normal to voice  X: uvula elevates symmetrically  XI: bilateral shoulder shrug symmetric and strong XII: midline tongue extension without fassiculations Motor:  Normal tone. 5/5 strength of BUE and BLE major muscle groups including strong and equal grip strength and dorsiflexion/plantar flexion Sensory: light touch normal in all extremities. CV: 2+ radial and DP/PT pulses       ED Treatments / Results  Labs (all labs ordered are listed, but only abnormal results are displayed) Labs Reviewed  CBC WITH DIFFERENTIAL/PLATELET - Abnormal; Notable for the following components:      Result Value   HCT 50.6 (*)    MCV 102.0 (*)    MCHC 28.9 (*)    All other components within normal limits  BASIC METABOLIC PANEL - Abnormal; Notable for the following  components:   Chloride 93 (*)    CO2 35 (*)    Glucose, Bld 112 (*)    Calcium 8.8 (*)    All  other components within normal limits  I-STAT TROPONIN, ED - Abnormal; Notable for the following components:   Troponin i, poc 0.22 (*)    All other components within normal limits  POCT I-STAT 7, (LYTES, BLD GAS, ICA,H+H) - Abnormal; Notable for the following components:   pH, Arterial 7.285 (*)    pCO2 arterial 95.1 (*)    pO2, Arterial 65.0 (*)    Bicarbonate 45.2 (*)    TCO2 48 (*)    Acid-Base Excess 13.0 (*)    HCT 47.0 (*)    Hemoglobin 16.0 (*)    All other components within normal limits  BRAIN NATRIURETIC PEPTIDE  TROPONIN I  TROPONIN I  TROPONIN I  TROPONIN I  RAPID URINE DRUG SCREEN, HOSP PERFORMED  I-STAT VENOUS BLOOD GAS, ED  I-STAT ARTERIAL BLOOD GAS, ED    EKG EKG Interpretation  Date/Time:  Thursday December 16 2018 10:57:12 EST Ventricular Rate:  86 PR Interval:    QRS Duration: 85 QT Interval:  368 QTC Calculation: 441 R Axis:   99 Text Interpretation:  Sinus rhythm Right atrial enlargement Consider left ventricular hypertrophy Lateral infarct, old Anterior ST elevation, probably due to LVH Confirmed by Virgel Manifold [phone]) on 12/16/2018 12:17:02 PM   Radiology Ct Head Wo Contrast  Result Date: 12/16/2018 CLINICAL DATA:  Altered mental status EXAM: CT HEAD WITHOUT CONTRAST TECHNIQUE: Contiguous axial images were obtained from the base of the skull through the vertex without intravenous contrast. COMPARISON:  March 24, 2018 FINDINGS: Brain: The ventricles and sulci appear within normal limits for age and stable. There is no intracranial mass, hemorrhage, extra-axial fluid collection, or midline shift. Brain parenchyma appears unremarkable. No evident acute infarct. Vascular: No hyperdense vessel. There is calcification in each carotid siphon region. Skull: The bony calvarium appears intact. Ossification along the anterior falx is stable. Sinuses/Orbits: There is mucosal thickening in several ethmoid air cells. There is slight mucosal thickening in each  anterior sphenoid sinus. Other visualized paranasal sinuses are clear. Orbits appear symmetric bilaterally. Other: Mastoid air cells are clear. IMPRESSION: Brain parenchyma appears unremarkable.  No mass or hemorrhage. There are foci of arterial vascular calcification. There is mild paranasal sinus disease. Electronically Signed   By: Lowella Grip III M.D.   On: 12/16/2018 12:24   Dg Chest Portable 1 View  Result Date: 12/16/2018 CLINICAL DATA:  Shortness of breath. EXAM: PORTABLE CHEST 1 VIEW COMPARISON:  Chest CT 11/24/2018 and chest x-ray 05/25/2018 FINDINGS: The heart is mildly enlarged but stable. There is tortuosity and calcification of the thoracic aorta. Stable emphysematous changes and pulmonary scarring. No acute infiltrates, edema or effusions. No worrisome pulmonary lesions. Stable dense linear scarring changes in the right upper lobe. The bony thorax is intact. IMPRESSION: Chronic emphysematous and scarring changes but no acute overlying pulmonary process. Electronically Signed   By: Marijo Sanes M.D.   On: 12/16/2018 11:48    Procedures Procedures (including critical care time) CRITICAL CARE Performed by: Rodney Booze   Total critical care time: 36 minutes  Critical care time was exclusive of separately billable procedures and treating other patients.  Critical care was necessary to treat or prevent imminent or life-threatening deterioration.  Critical care was time spent personally by me on the following activities: development of treatment plan with patient and/or surrogate as well  as nursing, discussions with consultants, evaluation of patient's response to treatment, examination of patient, obtaining history from patient or surrogate, ordering and performing treatments and interventions, ordering and review of laboratory studies, ordering and review of radiographic studies, pulse oximetry and re-evaluation of patient's condition. ca  Medications Ordered in  ED Medications  aspirin chewable tablet 324 mg (0 mg Oral Hold 12/16/18 1346)  aspirin EC tablet 81 mg (has no administration in time range)  furosemide (LASIX) tablet 20 mg (has no administration in time range)  citalopram (CELEXA) tablet 20 mg (has no administration in time range)  levothyroxine (SYNTHROID, LEVOTHROID) tablet 75 mcg (has no administration in time range)  enoxaparin (LOVENOX) injection 40 mg (has no administration in time range)  sodium chloride flush (NS) 0.9 % injection 3 mL (has no administration in time range)  sodium chloride flush (NS) 0.9 % injection 3 mL (has no administration in time range)  0.9 %  sodium chloride infusion (has no administration in time range)  methylPREDNISolone sodium succinate (SOLU-MEDROL) 125 mg/2 mL injection 60 mg (has no administration in time range)  acetaminophen (TYLENOL) tablet 650 mg (has no administration in time range)    Or  acetaminophen (TYLENOL) suppository 650 mg (has no administration in time range)  albuterol (PROVENTIL) (2.5 MG/3ML) 0.083% nebulizer solution 2.5 mg (2.5 mg Nebulization Not Given 12/16/18 1623)  albuterol (PROVENTIL) (2.5 MG/3ML) 0.083% nebulizer solution 2.5 mg (has no administration in time range)  ipratropium (ATROVENT) nebulizer solution 0.5 mg (0.5 mg Nebulization Not Given 12/16/18 1623)  fluticasone furoate-vilanterol (BREO ELLIPTA) 200-25 MCG/INH 1 puff (has no administration in time range)  ipratropium-albuterol (DUONEB) 0.5-2.5 (3) MG/3ML nebulizer solution 3 mL (3 mLs Nebulization Given 12/16/18 1140)  albuterol (PROVENTIL) (2.5 MG/3ML) 0.083% nebulizer solution 2.5 mg (2.5 mg Nebulization Given 12/16/18 1140)  methylPREDNISolone sodium succinate (SOLU-MEDROL) 125 mg/2 mL injection 125 mg (125 mg Intravenous Given 12/16/18 1349)     Initial Impression / Assessment and Plan / ED Course  I have reviewed the triage vital signs and the nursing notes.  Pertinent labs & imaging results that were available  during my care of the patient were reviewed by me and considered in my medical decision making (see chart for details).     Final Clinical Impressions(s) / ED Diagnoses   Final diagnoses:  Acute hypoxemic respiratory failure ([place])  Elevated troponin   Patient presenting for altered mental status and hypoxia.  On EMS evaluation initially patient was satting in the 80s on her normal oxygen requirement and was altered.  On my exam patient does appear confused and sleepy however is easily arousable and has no focal deficits on exam.  She has decreased lung sounds throughout all lung fields.  She was given multiple nebulizer treatments, Solu-Medrol without much improvement.  She was placed on BiPAP after results of ABG showed significant hypercarbia and evidence of respiratory acidosis.  CBC does not show leukocytosis.  Her BMP shows an elevated elevated bicarb at 35, does appear somewhat consistent with her prior.[name]  BNP is normal.  Initial i-STAT troponin was 0.22, suspect demand ischemia causing elevation.  Aspirin given.  Patient continues to deny chest pain.  EKG with Sinus rhythm Right atrial enlargement, LVH, Lateral infarct, old Anterior ST elevation, probably due to LVH. CXR with chronic emphysematous changes but no acute pulmonary edema, pneumonia or other acute finding.   Plan for admission for further treatment of acute toxic respiratory failure with hypercarbia.  1:47 PM Consult with [facility] who accepts  pt for admission  ED Discharge Orders    None       Bishop Dublin 12/16/18 1644    Virgel Manifold, MD 12/22/18 [phone]

## 2018-12-16 NOTE — ED Notes (Signed)
ED TO INPATIENT HANDOFF REPORT  ED Nurse Name and Phone #: Davene Costain [phone]  S Name/Age/Gender Jasmine [name] 73 y.o. female Room/Bed: 030C/030C  Code Status   Code Status: Prior  Home/SNF/Other Home Patient oriented to: self, place, time and situation Is this baseline? Yes   Triage Complete: Triage complete  Chief Complaint altered loc/resp  Triage Note Pt arrives to ED from home with complaints of alerted mental status and shortness of breath. Per EMS, the patient was found laying supine in her bed with her oxygen at 83% on 2L and lethargic. Pt's family stated she was hard to arouse and was confused. Pt states that she has been more SOB recently especially when she gets up and moves with her oxygen tank. Pt states she needs to sleep with a bunch of pillows under her head at home. Pt lung sounds were diminished on both sides and was given albuterol treatment by EMS. Pt on 2L at home at all times. Pt denies pain and is alert and oriented x4.    Allergies Allergies  Allergen Reactions  . Alendronate Sodium Other (See Comments)    Patient does not remember this reaction    Level of Care/Admitting Diagnosis ED Disposition    ED Disposition Condition [place]: [place] [[id number]]  Level of Care: Progressive [102]  Diagnosis: Respiratory failure [facility]) [[id number]]  Admitting Physician: Merton Border Marshal.Browner  Attending Physician: Laren Everts, [place]  Estimated length of stay: past midnight tomorrow  Certification:: I certify this patient will need inpatient services for at least 2 midnights  PT Class (Do Not Modify): Inpatient [101]  PT Acc Code (Do Not Modify): Private [1]       B Medical/Surgery History Past Medical History:  Diagnosis Date  . ALLERGIC RHINITIS 05/01/2007  . Anxiety   . Arthritis    "hands" (12/28/2015)  . ASTHMA 05/01/2007   "since I was a child"  . Cancer (HCC)    LUNG  . CHF (congestive heart failure) ([place])    . Chronic bronchitis ([place])   . COPD (chronic obstructive pulmonary disease) ([place])   . DEPRESSION 07/21/2008  . DVT (deep venous thrombosis) (HCC) 1960s   LLE  . FATIGUE 10/10/2010  . HYPERTENSION 05/01/2007  . HYPERTHYROIDISM 11/23/2007   Pt endorses having had Graves disease, possibly radioactive iodine x1, but no thyroidectomy  . HYPOTHYROIDISM 08/23/2009  . LUMBAR RADICULOPATHY, RIGHT 08/25/2008  . On home oxygen therapy    "3-4L; qd; all the time" (12/28/2015)  . OSTEOPOROSIS 05/01/2007  . SHOULDER PAIN, LEFT 08/23/2009  . SINUSITIS, CHRONIC 10/10/2010   Past Surgical History:  Procedure Laterality Date  . APPENDECTOMY    . DILATION AND CURETTAGE OF UTERUS  multiple   history of multiple dialations and curettages and miscarriages, unfortunately never carrying a child to term  . ECTOPIC PREGNANCY SURGERY  "late '60s or early '70s  . ELECTROCARDIOGRAM  06/20/2006  . OOPHORECTOMY Right      A IV Location/Drains/Wounds Patient Lines/Drains/Airways Status   Active Line/Drains/Airways    Name:   Placement date:   Placement time:   Site:   Days:   Peripheral IV 12/16/18 Left Hand   12/16/18    1226    Hand   less than 1   Peripheral IV 12/16/18 Right Forearm   12/16/18    1345    Forearm   less than 1          Intake/Output Last  24 hours No intake or output data in the 24 hours ending 12/16/18 1450  Labs/Imaging Results for orders placed or performed during the hospital encounter of 12/16/18 (from the past 48 hour(s))  CBC with Differential     Status: Abnormal   Collection Time: 12/16/18 11:20 AM  Result Value Ref Range   WBC 4.7 4.0 - 10.5 K/uL   RBC 4.96 3.87 - 5.11 MIL/uL   Hemoglobin 14.6 12.0 - 15.0 g/dL   HCT 50.6 (H) 36.0 - 46.0 %   MCV 102.0 (H) 80.0 - 100.0 fL   MCH 29.4 26.0 - 34.0 pg   MCHC 28.9 (L) 30.0 - 36.0 g/dL   RDW 14.8 11.5 - 15.5 %   Platelets 180 150 - 400 K/uL   nRBC 0.0 0.0 - 0.2 %   Neutrophils Relative % 72 %   Neutro Abs 3.4 1.7 - 7.7 K/uL    Lymphocytes Relative 14 %   Lymphs Abs 0.7 0.7 - 4.0 K/uL   Monocytes Relative 11 %   Monocytes Absolute 0.5 0.1 - 1.0 K/uL   Eosinophils Relative 2 %   Eosinophils Absolute 0.1 0.0 - 0.5 K/uL   Basophils Relative 1 %   Basophils Absolute 0.0 0.0 - 0.1 K/uL   Immature Granulocytes 0 %   Abs Immature Granulocytes 0.02 0.00 - 0.07 K/uL    Comment: Performed at [facility], [address], [place], [place] [address]  Basic metabolic panel     Status: Abnormal   Collection Time: 12/16/18 11:20 AM  Result Value Ref Range   Sodium 139 135 - 145 mmol/L   Potassium 4.1 3.5 - 5.1 mmol/L   Chloride 93 (L) 98 - 111 mmol/L   CO2 35 (H) 22 - 32 mmol/L   Glucose, Bld 112 (H) 70 - 99 mg/dL   BUN 9 8 - 23 mg/dL   Creatinine, Ser 0.80 0.44 - 1.00 mg/dL   Calcium 8.8 (L) 8.9 - 10.3 mg/dL   GFR calc non Af Amer >60 >60 mL/min   GFR calc Af Amer >60 >60 mL/min   Anion gap 11 5 - 15    Comment: Performed at [facility], [place] [address] [place], [place] [address]  Brain natriuretic peptide     Status: None   Collection Time: 12/16/18 11:20 AM  Result Value Ref Range   B Natriuretic Peptide 72.1 0.0 - 100.0 pg/mL    Comment: Performed at [place] [address] [place],  [address]  I-Stat Troponin, ED (not at [facility])     Status: Abnormal   Collection Time: 12/16/18 11:36 AM  Result Value Ref Range   Troponin i, poc 0.22 (HH) 0.00 - 0.08 ng/mL   Comment NOTIFIED PHYSICIAN    Comment 3            Comment: Due to the release kinetics of cTnI, a negative result within the first hours of the onset of symptoms does not rule out myocardial infarction with certainty. If myocardial infarction is still suspected, repeat the test at appropriate intervals.   I-STAT 7, (LYTES, BLD GAS, ICA, H+H)     Status: Abnormal   Collection Time: 12/16/18 12:51 PM  Result Value Ref Range   pH, Arterial 7.285 (L) 7.350 - 7.450   pCO2 arterial 95.1 (HH) 32.0 - 48.0 mmHg   pO2, Arterial  65.0 (L) 83.0 - 108.0 mmHg   Bicarbonate 45.2 (H) 20.0 - 28.0 mmol/L   TCO2 48 (  H) 22 - 32 mmol/L   O2 Saturation 87.0 %   Acid-Base Excess 13.0 (H) 0.0 - 2.0 mmol/L   Sodium 137 135 - 145 mmol/L   Potassium 4.0 3.5 - 5.1 mmol/L   Calcium, Ion 1.24 1.15 - 1.40 mmol/L   HCT 47.0 (H) 36.0 - 46.0 %   Hemoglobin 16.0 (H) 12.0 - 15.0 g/dL   Patient temperature HIDE    Collection site RADIAL, ALLEN'S TEST ACCEPTABLE    Drawn by Operator    Sample type ARTERIAL    Comment NOTIFIED PHYSICIAN    Ct Head Wo Contrast  Result Date: 12/16/2018 CLINICAL DATA:  Altered mental status EXAM: CT HEAD WITHOUT CONTRAST TECHNIQUE: Contiguous axial images were obtained from the base of the skull through the vertex without intravenous contrast. COMPARISON:  March 24, 2018 FINDINGS: Brain: The ventricles and sulci appear within normal limits for age and stable. There is no intracranial mass, hemorrhage, extra-axial fluid collection, or midline shift. Brain parenchyma appears unremarkable. No evident acute infarct. Vascular: No hyperdense vessel. There is calcification in each carotid siphon region. Skull: The bony calvarium appears intact. Ossification along the anterior falx is stable. Sinuses/Orbits: There is mucosal thickening in several ethmoid air cells. There is slight mucosal thickening in each anterior sphenoid sinus. Other visualized paranasal sinuses are clear. Orbits appear symmetric bilaterally. Other: Mastoid air cells are clear. IMPRESSION: Brain parenchyma appears unremarkable.  No mass or hemorrhage. There are foci of arterial vascular calcification. There is mild paranasal sinus disease. Electronically Signed   By: Lowella Grip III M.D.   On: 12/16/2018 12:24   Dg Chest Portable 1 View  Result Date: 12/16/2018 CLINICAL DATA:  Shortness of breath. EXAM: PORTABLE CHEST 1 VIEW COMPARISON:  Chest CT 11/24/2018 and chest x-ray 05/25/2018 FINDINGS: The heart is mildly enlarged but stable. There is  tortuosity and calcification of the thoracic aorta. Stable emphysematous changes and pulmonary scarring. No acute infiltrates, edema or effusions. No worrisome pulmonary lesions. Stable dense linear scarring changes in the right upper lobe. The bony thorax is intact. IMPRESSION: Chronic emphysematous and scarring changes but no acute overlying pulmonary process. Electronically Signed   By: Marijo Sanes M.D.   On: 12/16/2018 11:48    Pending Labs Unresulted Labs (From admission, onward)    Start     Ordered   12/16/18 1320  Troponin I - ONCE - STAT  ONCE - STAT,   STAT     12/16/18 1319   Signed and Held  Troponin I - Now Then Q6H  Now then every 6 hours,   R     Signed and Held   Signed and Held  Urine rapid drug screen (hosp performed)  ONCE - STAT,   R     Signed and Held          Vitals/Pain Today's Vitals   12/16/18 1300 12/16/18 1315 12/16/18 1415 12/16/18 1430  BP: 114/61 114/61 119/65 121/64  Pulse: 76 74 70 81  Resp: 17 14 18    Temp:      TempSrc:      SpO2: 96% 97% 98% 97%  PainSc:        Isolation Precautions No active isolations  Medications Medications  aspirin chewable tablet 324 mg (0 mg Oral Hold 12/16/18 1346)  ipratropium-albuterol (DUONEB) 0.5-2.5 (3) MG/3ML nebulizer solution 3 mL (3 mLs Nebulization Given 12/16/18 1140)  albuterol (PROVENTIL) (2.5 MG/3ML) 0.083% nebulizer solution 2.5 mg (2.5 mg Nebulization Given 12/16/18 1140)  methylPREDNISolone sodium succinate (  SOLU-MEDROL) 125 mg/2 mL injection 125 mg (125 mg Intravenous Given 12/16/18 1349)    Mobility walks High fall risk   Focused Assessments Pulmonary Assessment Handoff:  Lung sounds: Bilateral Breath Sounds: Diminished L Breath Sounds: Diminished R Breath Sounds: Diminished O2 Device: Bi-PAP        R Recommendations: See Admitting Provider Note  Report given to:   Additional Notes:  Pt is currently on Bi PAP. CO2 of 95.1. Pt still lethargic but A&Ox4.

## 2018-12-16 NOTE — ED Triage Notes (Addendum)
Pt arrives to ED from home with complaints of alerted mental status and shortness of breath. Per EMS, the patient was found laying supine in her bed with her oxygen at 83% on 2L and lethargic. Pt's family stated she was hard to arouse and was confused. Pt states that she has been more SOB recently especially when she gets up and moves with her oxygen tank. Pt states she needs to sleep with a bunch of pillows under her head at home. Pt lung sounds were diminished on both sides and was given albuterol treatment by EMS. Pt on 2L at home at all times. Pt denies pain and is alert and oriented x4.

## 2018-12-16 NOTE — Progress Notes (Signed)
Per ED MD wait to place bipap until ABG results can be evaluated.  RN aware.

## 2018-12-16 NOTE — ED Notes (Signed)
ED Provider at bedside. 

## 2018-12-17 ENCOUNTER — Inpatient Hospital Stay (HOSPITAL_COMMUNITY): Payer: Medicare HMO

## 2018-12-17 DIAGNOSIS — E039 Hypothyroidism, unspecified: Secondary | ICD-10-CM

## 2018-12-17 DIAGNOSIS — I214 Non-ST elevation (NSTEMI) myocardial infarction: Secondary | ICD-10-CM

## 2018-12-17 DIAGNOSIS — J449 Chronic obstructive pulmonary disease, unspecified: Secondary | ICD-10-CM

## 2018-12-17 LAB — TROPONIN I

## 2018-12-17 LAB — ECHOCARDIOGRAM COMPLETE
Height: 60 in
Weight: 1832 oz

## 2018-12-17 MED ORDER — IPRATROPIUM-ALBUTEROL 0.5-2.5 (3) MG/3ML IN SOLN
3.0000 mL | Freq: Three times a day (TID) | RESPIRATORY_TRACT | Status: DC
  Administered 2018-12-17: 3 mL via RESPIRATORY_TRACT
  Filled 2018-12-17: qty 3

## 2018-12-17 MED ORDER — IPRATROPIUM-ALBUTEROL 0.5-2.5 (3) MG/3ML IN SOLN: 3.0000 mL | RESPIRATORY_TRACT | Status: DC | PRN

## 2018-12-17 NOTE — Progress Notes (Signed)
PROGRESS NOTE    DELAINY MCELHINEY  MGQ:[id number] DOB: 05/09/1946 DOA: 12/16/2018 PCP: Biagio Borg, MD    Brief Narrative:  73 year old female who presented with dyspnea and altered mentation.  She does have significant past medical history of asthma, COPD with chronic hypoxic respiratory failure, and diastolic heart failure.  She was found unresponsive by her husband, apparently she has been recently prescribed BiPAP and her mask has been recently adjusted to improve efficacy and compliance.  When EMS arrived her oxygen saturation was 80% in the emergency department she was placed on the BiPAP.  On her initial physical examination, confused and disoriented, on a full face mask BiPAP, blood pressure 119/65, heart rate 70, temperature 97.6, oxygen saturation 98%, respiratory rate 18.  His lungs had decreased breath sounds bilaterally, heart S1-S2 present and rhythmic, abdomen was soft nontender, no lower extremity edema.  pH 7.28, PCO2 95.1, PaO2 65.0, HCO3 45.2. Her chest x-ray had significant hyperinflation, positive scaring in the right upper lobe.  Patient was admitted to the hospital with a working diagnosis of acute hypercapnic/hypoxic respiratory failure   Assessment & Plan:   Active Problems:   Respiratory failure ([place])  1. Acute on chronic hypercapnic and hypoxic respiratory failure. Patient has responded well to Bipap, this am is more awake and alert, will continue bronchodilator therapy and Bipap at night. Will do a basal abg in am. Patient has a new interface at home for her bipap. Continue Breo. Will dc systemic steroids for now.   2. Hypothyroid. Continue levothyroxine per home regimen.   3. Depression. Continue citalopram.     DVT prophylaxis: enoxaparin   Code Status: full Family Communication: no family at the bedside  Disposition Plan/ discharge barriers: pending clinical improvement, possible dc in am.   Body mass index is 22.36 kg/m. Malnutrition Type:       Malnutrition Characteristics:      Nutrition Interventions:     RN Pressure Injury Documentation:     Consultants:     Procedures:     Antimicrobials:       Subjective: Patient is feeling better, not yet back to baseline. No chest pain, no nausea or vomiting. Patient has been on Bipap at home, recently changed interface with improvement of her compliance and symptoms.   Objective: Vitals:   12/17/18 0332 12/17/18 0715 12/17/18 0847 12/17/18 1158  BP: 112/63 132/66  123/63  Pulse: 67 82  87  Resp: 14 19  14   Temp: 98.3 F (36.8 C) 98.1 F (36.7 C)  98.5 F (36.9 C)  TempSrc: Oral Oral  Axillary  SpO2: 100% 97% 100% 92%  Weight:      Height:        Intake/Output Summary (Last 24 hours) at 12/17/2018 1444 Last data filed at 12/17/2018 0040 Gross per 24 hour  Intake -  Output 300 ml  Net -300 ml   Filed Weights   12/16/18 1905  Weight: 51.9 kg    Examination:   General: deconditioned  Neurology: Awake and alert, non focal  E ENT: mild pallor, no icterus, oral mucosa moist Cardiovascular: No JVD. S1-S2 present, rhythmic, no gallops, rubs, or murmurs. No lower extremity edema. Pulmonary: positive breath sounds bilaterally, decreased air movement, no significant wheezing, rhonchi or rales. Gastrointestinal. Abdomen with no organomegaly, non tender, no rebound or guarding Skin. No rashes Musculoskeletal: no joint deformities     Data Reviewed: I have personally reviewed following labs and imaging studies  CBC: Recent Labs  Lab  12/16/18 1120 12/16/18 1251  WBC 4.7  --   NEUTROABS 3.4  --   HGB 14.6 16.0*  HCT 50.6* 47.0*  MCV 102.0*  --   PLT 180  --    Basic Metabolic Panel: Recent Labs  Lab 12/16/18 1120 12/16/18 1251  NA 139 137  K 4.1 4.0  CL 93*  --   CO2 35*  --   GLUCOSE 112*  --   BUN 9  --   CREATININE 0.80  --   CALCIUM 8.8*  --    GFR: Estimated Creatinine Clearance: 44.3 mL/min (by C-G formula based on SCr of  0.8 mg/dL). Liver Function Tests: No results for input(s): AST, ALT, ALKPHOS, BILITOT, PROT, ALBUMIN in the last 168 hours. No results for input(s): LIPASE, AMYLASE in the last 168 hours. No results for input(s): AMMONIA in the last 168 hours. Coagulation Profile: No results for input(s): INR, PROTIME in the last 168 hours. Cardiac Enzymes: Recent Labs  Lab 12/16/18 1350 12/16/18 1618 12/16/18 2250 12/17/18 0327  TROPONINI <0.03 <0.03 <0.03 <0.03   BNP (last 3 results) No results for input(s): PROBNP in the last 8760 hours. HbA1C: No results for input(s): HGBA1C in the last 72 hours. CBG: No results for input(s): GLUCAP in the last 168 hours. Lipid Profile: No results for input(s): CHOL, HDL, LDLCALC, TRIG, CHOLHDL, LDLDIRECT in the last 72 hours. Thyroid Function Tests: No results for input(s): TSH, T4TOTAL, FREET4, T3FREE, THYROIDAB in the last 72 hours. Anemia Panel: No results for input(s): VITAMINB12, FOLATE, FERRITIN, TIBC, IRON, RETICCTPCT in the last 72 hours.    Radiology Studies: I have reviewed all of the imaging during this hospital visit personally     Scheduled Meds: . aspirin  324 mg Oral Once  . aspirin EC  81 mg Oral Daily  . citalopram  20 mg Oral Daily  . enoxaparin (LOVENOX) injection  40 mg Subcutaneous Q24H  . fluticasone furoate-vilanterol  1 puff Inhalation Daily  . furosemide  20 mg Oral Daily  . levothyroxine  75 mcg Oral QAC breakfast  . methylPREDNISolone (SOLU-MEDROL) injection  60 mg Intravenous Q6H  . sodium chloride flush  3 mL Intravenous Q12H   Continuous Infusions: . sodium chloride       LOS: 1 day        Elanora Quin Gerome Apley, MD

## 2018-12-17 NOTE — Care Management Note (Signed)
Case Management Note  Patient Details  Name: ICYSS SKOG MRN: [id number] Date of Birth: [birth year]  Subjective/Objective:  From home with ex husband, Resp Failure, she has home oxygen with [employer] 3 (liters),  She has neb machine, cpap machine and a walker that she uses sometimes, she has PCP [name], she has no problem with transportation, she uses [facility]( they deliver meds to her- they are closed on the weekends).  She is active with [facility].  She has has [place] services with [facility] in the past.                     Action/Plan: NCM will follow for transition of care needs  Expected Discharge Date:  12/18/18               Expected Discharge Plan:  [place]  In-House Referral:     Discharge planning Services  CM Consult  Post Acute Care Choice:    Choice offered to:     DME Arranged:    DME Agency:     HH Arranged:    HH Agency:     Status of Service:  In process, will continue to follow  If discussed at Long Length of Stay Meetings, dates discussed:    Additional Comments:  Zenon Mayo, RN 12/17/2018, 3:31 PM

## 2018-12-17 NOTE — Progress Notes (Signed)
  Echocardiogram 2D Echocardiogram has been performed.  Bobbye Charleston 12/17/2018, 3:36 PM

## 2018-12-17 NOTE — Progress Notes (Signed)
PROGRESS NOTE        PATIENT DETAILS Name: Jasmine [name] Age: 72 y.o. Sex: female Date of Birth: [birth year] Admit Date: 12/16/2018 Admitting Physician Merton Border, MD OVF:IEPP, Hunt Oris, MD  Brief Narrative: Patient is a 73 y.o. female with past medical history of asthma, COPD, diastolic CHF, hypertension, and history of lung cancer, who presented with hypoxemia and altered mental status. Patient states her husband called EMS because she was "sleeping too much." Per EMS, patient was found by her husband lying supine in bed unresponsive, hypoxemic with pulse ox in the 80s on RA. Patient wears 3L of O2 at home daily and started using BiPAP while she sleeps approximately 3 months ago. In the ED, BMP showed an elevated bicarb at 35. Chest XR demonstrates chronic emphysematous and scarring changes but no acute overlying pulmonary process. Patient was admitted for acute respiratory failure with hypercarbia.   Subjective: Patient is alert and interactive, lying comfortably in bed. She states that she is feeling much better today and that her breathing is back to baseline. She has been ambulating to the bathroom without issue. She denies fever, chills, chest pain, shortness of breath, nausea, vomiting, or diarrhea.    Assessment/Plan: Active Problems:   Respiratory failure ([place])  1. COPD Exacerbation  -Patient breathing comfortably on 3L supplemental O2. She states this is her baseline at home. Continue nasal canula as needed.  -She recently started wearing BiPAP at night, tolerating it well. Plan to continue BiPAP at night while inpatient.  -Continue Breo 1 puff daily and DuoNeb q4h as needed  2. Diastolic CHF -No sign of volume overload. Will continue PO lasix 20 mg qd  3. Asthma --Continue Breo 1 puff daily and DuoNeb q4h as needed   4. Hypothyroidism -Continue Synthroid 5 mcg qd    Echo (reviewed):Echo performed on 12/17/18, results pending. EF 65-70% on TTE  done on 12/1015.   Morning labs/Imaging ordered: yes  DVT Prophylaxis: Prophylactic Lovenox   Code Status: Full code   Family Communication: None  Disposition Plan: Remain inpatient pending clinical improvement.   Antimicrobial agents: Anti-infectives (From admission, onward)   None      MEDICATIONS: Scheduled Meds: . aspirin  324 mg Oral Once  . aspirin EC  81 mg Oral Daily  . citalopram  20 mg Oral Daily  . enoxaparin (LOVENOX) injection  40 mg Subcutaneous Q24H  . fluticasone furoate-vilanterol  1 puff Inhalation Daily  . furosemide  20 mg Oral Daily  . levothyroxine  75 mcg Oral QAC breakfast  . methylPREDNISolone (SOLU-MEDROL) injection  60 mg Intravenous Q6H  . sodium chloride flush  3 mL Intravenous Q12H   Continuous Infusions: . sodium chloride     PRN Meds:.sodium chloride, acetaminophen **OR** acetaminophen, ipratropium-albuterol, sodium chloride flush   PHYSICAL EXAM: Vital signs: Vitals:   12/17/18 0332 12/17/18 0715 12/17/18 0847 12/17/18 1158  BP: 112/63 132/66  123/63  Pulse: 67 82  87  Resp: 14 19  14   Temp: 98.3 F (36.8 C) 98.1 F (36.7 C)  98.5 F (36.9 C)  TempSrc: Oral Oral  Axillary  SpO2: 100% 97% 100% 92%  Weight:      Height:       Filed Weights   12/16/18 1905  Weight: 51.9 kg   Body mass index is 22.36 kg/m.   General: Awake, alert, interactive.  Appears well, not toxic looking HEENT: Moist oral mucosa. No scleral icterus.  Neck: Supple, no JVD.  CV: Regular rate and rhythm. S1 S2 regular. No murmurs, rubs, or gallops.  Pulm: Good air entry bilaterally GI: Non tender and not distended  Extremities: Bilateral LE shows no edema Neurology: Speech clear Musculoskeletal: No visible joint deformity Skin: No Rash, warm and dry  I have personally reviewed following labs and imaging studies  LABORATORY DATA: CBC: Recent Labs  Lab 12/16/18 1120 12/16/18 1251  WBC 4.7  --   NEUTROABS 3.4  --   HGB 14.6 16.0*  HCT 50.6*  47.0*  MCV 102.0*  --   PLT 180  --     Basic Metabolic Panel: Recent Labs  Lab 12/16/18 1120 12/16/18 1251  NA 139 137  K 4.1 4.0  CL 93*  --   CO2 35*  --   GLUCOSE 112*  --   BUN 9  --   CREATININE 0.80  --   CALCIUM 8.8*  --     GFR: Estimated Creatinine Clearance: 44.3 mL/min (by C-G formula based on SCr of 0.8 mg/dL).  Liver Function Tests: No results for input(s): AST, ALT, ALKPHOS, BILITOT, PROT, ALBUMIN in the last 168 hours. No results for input(s): LIPASE, AMYLASE in the last 168 hours. No results for input(s): AMMONIA in the last 168 hours.  Coagulation Profile: No results for input(s): INR, PROTIME in the last 168 hours.  Cardiac Enzymes: Recent Labs  Lab 12/16/18 1350 12/16/18 1618 12/16/18 2250 12/17/18 0327  TROPONINI <0.03 <0.03 <0.03 <0.03    BNP (last 3 results) No results for input(s): PROBNP in the last 8760 hours.  HbA1C: No results for input(s): HGBA1C in the last 72 hours.  CBG: No results for input(s): GLUCAP in the last 168 hours.  Lipid Profile: No results for input(s): CHOL, HDL, LDLCALC, TRIG, CHOLHDL, LDLDIRECT in the last 72 hours.  Thyroid Function Tests: No results for input(s): TSH, T4TOTAL, FREET4, T3FREE, THYROIDAB in the last 72 hours.  Anemia Panel: No results for input(s): VITAMINB12, FOLATE, FERRITIN, TIBC, IRON, RETICCTPCT in the last 72 hours.  Urine analysis:    Component Value Date/Time   COLORURINE YELLOW 09/20/2018 1426   APPEARANCEUR CLEAR 09/20/2018 1426   LABSPEC 1.010 09/20/2018 1426   PHURINE 7.0 09/20/2018 1426   GLUCOSEU NEGATIVE 09/20/2018 1426   HGBUR NEGATIVE 09/20/2018 1426   BILIRUBINUR NEGATIVE 09/20/2018 1426   BILIRUBINUR negative 08/11/2011 1358   KETONESUR NEGATIVE 09/20/2018 1426   PROTEINUR 30 (A) 03/24/2018 1925   UROBILINOGEN 0.2 09/20/2018 1426   NITRITE NEGATIVE 09/20/2018 1426   LEUKOCYTESUR NEGATIVE 09/20/2018 1426    Sepsis Labs: Lactic Acid, Venous    Component  Value Date/Time   LATICACIDVEN 1.13 03/24/2018 1621    MICROBIOLOGY: Recent Results (from the past 240 hour(s))  MRSA PCR Screening     Status: None   Collection Time: 12/16/18  5:17 PM  Result Value Ref Range Status   MRSA by PCR NEGATIVE NEGATIVE Final    Comment:        The GeneXpert MRSA Assay (FDA approved for NASAL specimens only), is one component of a comprehensive MRSA colonization surveillance program. It is not intended to diagnose MRSA infection nor to guide or monitor treatment for MRSA infections. Performed at [facility], [place] [address] [place], [place] [address]     RADIOLOGY STUDIES/RESULTS: Ct Head Wo Contrast  Result Date: 12/16/2018 CLINICAL DATA:  Altered mental status EXAM: CT  HEAD WITHOUT CONTRAST TECHNIQUE: Contiguous axial images were obtained from the base of the skull through the vertex without intravenous contrast. COMPARISON:  March 24, 2018 FINDINGS: Brain: The ventricles and sulci appear within normal limits for age and stable. There is no intracranial mass, hemorrhage, extra-axial fluid collection, or midline shift. Brain parenchyma appears unremarkable. No evident acute infarct. Vascular: No hyperdense vessel. There is calcification in each carotid siphon region. Skull: The bony calvarium appears intact. Ossification along the anterior falx is stable. Sinuses/Orbits: There is mucosal thickening in several ethmoid air cells. There is slight mucosal thickening in each anterior sphenoid sinus. Other visualized paranasal sinuses are clear. Orbits appear symmetric bilaterally. Other: Mastoid air cells are clear. IMPRESSION: Brain parenchyma appears unremarkable.  No mass or hemorrhage. There are foci of arterial vascular calcification. There is mild paranasal sinus disease. Electronically Signed   By: Lowella Grip III M.D.   On: 12/16/2018 12:24   Ct Chest Wo Contrast  Result Date: 11/24/2018 CLINICAL DATA:  Follow-up lung nodule EXAM: CT CHEST  WITHOUT CONTRAST TECHNIQUE: Multidetector CT imaging of the chest was performed following the standard protocol without IV contrast. COMPARISON:  12/16/2017 FINDINGS: Cardiovascular: Three-vessel coronary artery calcifications. Normal heart size. No pericardial effusion. Mediastinum/Nodes: No enlarged mediastinal, hilar, or axillary lymph nodes. Thyroid gland, trachea, and esophagus demonstrate no significant findings. Lungs/Pleura: Moderate centrilobular emphysema. Multiple stable small pulmonary nodules of the upper lobes bilaterally. Unchanged bandlike scarring of the inferior right upper lobe. No pleural effusion or pneumothorax. Upper Abdomen: No acute abnormality. Musculoskeletal: No chest wall mass or suspicious bone lesions identified. IMPRESSION: 1. Stable small pulmonary nodules, likely benign sequelae of prior infection. No further routine CT follow-up is required for these nodules. 2.  Emphysema. 3.  Coronary artery disease. Electronically Signed   By: Eddie Candle M.D.   On: 11/24/2018 13:55   Dg Chest Portable 1 View  Result Date: 12/16/2018 CLINICAL DATA:  Shortness of breath. EXAM: PORTABLE CHEST 1 VIEW COMPARISON:  Chest CT 11/24/2018 and chest x-ray 05/25/2018 FINDINGS: The heart is mildly enlarged but stable. There is tortuosity and calcification of the thoracic aorta. Stable emphysematous changes and pulmonary scarring. No acute infiltrates, edema or effusions. No worrisome pulmonary lesions. Stable dense linear scarring changes in the right upper lobe. The bony thorax is intact. IMPRESSION: Chronic emphysematous and scarring changes but no acute overlying pulmonary process. Electronically Signed   By: Marijo Sanes M.D.   On: 12/16/2018 11:48     LOS: 1 day   Deboraha Sprang, PA-S  12/17/2018, 2:52 PM

## 2018-12-17 NOTE — Plan of Care (Signed)
  Problem: Education: Goal: Knowledge of General Education information will improve Description: Including pain rating scale, medication(s)/side effects and non-pharmacologic comfort measures Outcome: Progressing   Problem: Clinical Measurements: Goal: Ability to maintain clinical measurements within normal limits will improve Outcome: Progressing Goal: Respiratory complications will improve Outcome: Progressing   

## 2018-12-18 DIAGNOSIS — I5032 Chronic diastolic (congestive) heart failure: Secondary | ICD-10-CM

## 2018-12-18 LAB — BLOOD GAS, ARTERIAL
Acid-Base Excess: 16.9 mmol/L — ABNORMAL HIGH (ref 0.0–2.0)
Bicarbonate: 43.5 mmol/L — ABNORMAL HIGH (ref 20.0–28.0)
Drawn by: 24513
Expiratory PAP: 6
INSPIRATORY PAP: 12
Mode: POSITIVE
O2 Content: 4 L/min
O2 Saturation: 99.5 %
Patient temperature: 98.6
pCO2 arterial: 84.2 mmHg (ref 32.0–48.0)
pH, Arterial: 7.333 — ABNORMAL LOW (ref 7.350–7.450)
pO2, Arterial: 237 mmHg — ABNORMAL HIGH (ref 83.0–108.0)

## 2018-12-18 LAB — BASIC METABOLIC PANEL
Anion gap: 7 (ref 5–15)
BUN: 18 mg/dL (ref 8–23)
CHLORIDE: 87 mmol/L — AB (ref 98–111)
CO2: 41 mmol/L — ABNORMAL HIGH (ref 22–32)
CREATININE: 0.66 mg/dL (ref 0.44–1.00)
Calcium: 9.8 mg/dL (ref 8.9–10.3)
GFR calc Af Amer: >60 mL/min (ref >60)
GFR calc non Af Amer: >60 mL/min (ref >60)
Glucose, Bld: 144 mg/dL — ABNORMAL HIGH (ref 70–99)
Potassium: 4.6 mmol/L (ref 3.5–5.1)
Sodium: 135 mmol/L (ref 135–145)

## 2018-12-18 NOTE — Progress Notes (Signed)
Pt placed on BIPAP at this time. BIPAP settings titrated per MD order and for pt comfort. RT will continue to monitor.

## 2018-12-18 NOTE — Evaluation (Signed)
Physical Therapy Evaluation Patient Details Name: CARIANNA LAGUE MRN: [id number] DOB: 06-04-1946 Today's Date: 12/18/2018   History of Present Illness  Pt adm with acute on chronic hypercapnic and hypoxic respiratory failure. Pt initially on bipap. PMH - copd, asthma, heart failiure, arthritis, htn  Clinical Impression  Pt doing well with mobility and no further PT needed.       Follow Up Recommendations No PT follow up    Equipment Recommendations  None recommended by PT    Recommendations for Other Services       Precautions / Restrictions Precautions Precautions: None      Mobility  Bed Mobility Overal bed mobility: Modified Independent                Transfers Overall transfer level: Modified independent                  Ambulation/Gait Ambulation/Gait assistance: Modified independent (Device/Increase time) Gait Distance (Feet): 325 Feet Assistive device: None;4-wheeled walker Gait Pattern/deviations: Step-through pattern;Decreased stride length Gait velocity: decr Gait velocity interpretation: 1.31 - 2.62 ft/sec, indicative of limited community ambulator General Gait Details: Steady gait with and without assistive device. Amb on 3L with SpO2 90%.  Stairs            Wheelchair Mobility    Modified Rankin (Stroke Patients Only)       Balance Overall balance assessment: No apparent balance deficits (not formally assessed)                                           Pertinent Vitals/Pain Pain Assessment: No/denies pain    Home Living Family/patient expects to be discharged to:: Private residence Living Arrangements: Other relatives Available Help at Discharge: Family Type of Home: House Home Access: Ramped entrance     [place]: [place]: Environmental consultant - 4 wheels;Walker - 2 wheels Additional Comments: home O2 at 3L    Prior Function Level of Independence: Independent               Hand  Dominance   Dominant Hand: Right    Extremity/Trunk Assessment   Upper Extremity Assessment Upper Extremity Assessment: Overall WFL for tasks assessed    Lower Extremity Assessment Lower Extremity Assessment: Overall WFL for tasks assessed       Communication   Communication: No difficulties  Cognition Arousal/Alertness: Awake/alert Behavior During Therapy: WFL for tasks assessed/performed Overall Cognitive Status: Within Functional Limits for tasks assessed                                        General Comments      Exercises     Assessment/Plan    PT Assessment Patent does not need any further PT services  PT Problem List         PT Treatment Interventions      PT Goals (Current goals can be found in the Care Plan section)  Acute Rehab PT Goals PT Goal Formulation: All assessment and education complete, DC therapy    Frequency     Barriers to discharge        Co-evaluation               AM-PAC PT "6 Clicks" Mobility  Outcome Measure Help needed turning from  your back to your side while in a flat bed without using bedrails?: None Help needed moving from lying on your back to sitting on the side of a flat bed without using bedrails?: None Help needed moving to and from a bed to a chair (including a wheelchair)?: None Help needed standing up from a chair using your arms (e.g., wheelchair or bedside chair)?: None Help needed to walk in hospital room?: None Help needed climbing 3-5 steps with a railing? : None 6 Click Score: 24    End of Session Equipment Utilized During Treatment: Oxygen Activity Tolerance: Patient tolerated treatment well Patient left: in chair;with call bell/phone within reach Nurse Communication: Mobility status PT Visit Diagnosis: Other abnormalities of gait and mobility (R26.89)    Time: 0315-9458 PT Time Calculation (min) (ACUTE ONLY): 22 min   Charges:   PT Evaluation $PT Eval Moderate Complexity: 1  Mod          [place] Pager [phone] Office [place] 12/18/2018, 1:25 PM

## 2018-12-18 NOTE — Progress Notes (Signed)
PROGRESS NOTE    Jasmine [name]  OYD:[id number] DOB: [birth year] DOA: 12/16/2018 PCP: Biagio Borg, MD    Brief Narrative:  73 year old female who presented with dyspnea and altered mentation.  She does have significant past medical history of asthma, COPD with chronic hypoxic respiratory failure, and diastolic heart failure.  She was found unresponsive by her husband, apparently she has been recently prescribed BiPAP and her mask has been recently adjusted to improve efficacy and compliance.  When EMS arrived her oxygen saturation was 80% in the emergency department she was placed on the BiPAP.  On her initial physical examination, confused and disoriented, on a full face mask BiPAP, blood pressure 119/65, heart rate 70, temperature 97.6, oxygen saturation 98%, respiratory rate 18.  His lungs had decreased breath sounds bilaterally, heart S1-S2 present and rhythmic, abdomen was soft nontender, no lower extremity edema.  pH 7.28, PCO2 95.1, PaO2 65.0, HCO3 45.2. Her chest x-ray had significant hyperinflation, positive scaring in the right upper lobe.  Patient was admitted to the hospital with a working diagnosis of acute hypercapnic/hypoxic respiratory failure   Assessment & Plan:   Active Problems:   Respiratory failure ([place])  1. Acute on chronic hypercapnic and hypoxic respiratory failure. Patient used non invasive mechanical ventilation last night, nasal mask 14/6, with no apparent discomfort, her pH this am is up to 7,33, with PCO2 84, Pa02 237 and bicarb 43. Will continue non invasive mechanical ventilation for the next 24 H, to target a pH close to 7,40. For tonight will target a delta pressure at least of 10 cm H20 and will check arterial blood gas in am. Will plant to continue same interface and settings at home. Continue bronchodilator therapy and inhales corticosteroids.   2. Hypothyroid. On levothyroxine.  3. Depression. On citalopram.   4. Diastolic heart failure. Chronic and  stable, no signs of exacerbation, will continue home dose of furosemide.  DVT prophylaxis: enoxaparin   Code Status: full Family Communication: no family at the bedside  Disposition Plan/ discharge barriers: pending clinical improvement, possible dc in    Body mass index is 22.36 kg/m. Malnutrition Type:      Malnutrition Characteristics:      Nutrition Interventions:     RN Pressure Injury Documentation:     Consultants:     Procedures:     Antimicrobials:       Subjective: Patient last night use nose mask, with no complications, this am she has mild somnolence, does not recall our conversation from yesterday. Has not been out of the bed yet.   Objective: Vitals:   12/17/18 2317 12/18/18 0358 12/18/18 0758 12/18/18 0808  BP: 117/65 139/71 126/67   Pulse: 72 62 (!) 58 (!) 59  Resp: 16 18 11 18   Temp: 97.7 F (36.5 C) 98 F (36.7 C) (!) 97.5 F (36.4 C)   TempSrc: Oral Oral Axillary   SpO2: 97% 97% 92% 94%  Weight:      Height:        Intake/Output Summary (Last 24 hours) at 12/18/2018 0927 Last data filed at 12/17/2018 2025 Gross per 24 hour  Intake 3 ml  Output -  Net 3 ml   Filed Weights   12/16/18 1905  Weight: 51.9 kg    Examination:   General: deconditioned.  Neurology: Awake and alert, non focal  E ENT: no pallor, no icterus, oral mucosa moist Cardiovascular: No JVD. S1-S2 present, rhythmic, no gallops, rubs, or murmurs. Trace lower extremity edema. Pulmonary:  decreased breath sounds bilaterally, poor air movement, no significant wheezing or rhonchi, scattered rales. Gastrointestinal. Abdomen with no organomegaly, non tender, no rebound or guarding Skin. No rashes Musculoskeletal: no joint deformities     Data Reviewed: I have personally reviewed following labs and imaging studies  CBC: Recent Labs  Lab 12/16/18 1120 12/16/18 1251  WBC 4.7  --   NEUTROABS 3.4  --   HGB 14.6 16.0*  HCT 50.6* 47.0*  MCV 102.0*  --     PLT 180  --    Basic Metabolic Panel: Recent Labs  Lab 12/16/18 1120 12/16/18 1251 12/18/18 0320  NA 139 137 135  K 4.1 4.0 4.6  CL 93*  --  87*  CO2 35*  --  41*  GLUCOSE 112*  --  144*  BUN 9  --  18  CREATININE 0.80  --  0.66  CALCIUM 8.8*  --  9.8   GFR: Estimated Creatinine Clearance: 44.3 mL/min (by C-G formula based on SCr of 0.66 mg/dL). Liver Function Tests: No results for input(s): AST, ALT, ALKPHOS, BILITOT, PROT, ALBUMIN in the last 168 hours. No results for input(s): LIPASE, AMYLASE in the last 168 hours. No results for input(s): AMMONIA in the last 168 hours. Coagulation Profile: No results for input(s): INR, PROTIME in the last 168 hours. Cardiac Enzymes: Recent Labs  Lab 12/16/18 1350 12/16/18 1618 12/16/18 2250 12/17/18 0327  TROPONINI <0.03 <0.03 <0.03 <0.03   BNP (last 3 results) No results for input(s): PROBNP in the last 8760 hours. HbA1C: No results for input(s): HGBA1C in the last 72 hours. CBG: No results for input(s): GLUCAP in the last 168 hours. Lipid Profile: No results for input(s): CHOL, HDL, LDLCALC, TRIG, CHOLHDL, LDLDIRECT in the last 72 hours. Thyroid Function Tests: No results for input(s): TSH, T4TOTAL, FREET4, T3FREE, THYROIDAB in the last 72 hours. Anemia Panel: No results for input(s): VITAMINB12, FOLATE, FERRITIN, TIBC, IRON, RETICCTPCT in the last 72 hours.    Radiology Studies: I have reviewed all of the imaging during this hospital visit personally     Scheduled Meds: . aspirin  324 mg Oral Once  . aspirin EC  81 mg Oral Daily  . citalopram  20 mg Oral Daily  . enoxaparin (LOVENOX) injection  40 mg Subcutaneous Q24H  . fluticasone furoate-vilanterol  1 puff Inhalation Daily  . furosemide  20 mg Oral Daily  . levothyroxine  75 mcg Oral QAC breakfast  . sodium chloride flush  3 mL Intravenous Q12H   Continuous Infusions: . sodium chloride       LOS: 2 days        Mauricio Gerome Apley, MD

## 2018-12-19 DIAGNOSIS — J432 Centrilobular emphysema: Secondary | ICD-10-CM

## 2018-12-19 DIAGNOSIS — Z720 Tobacco use: Secondary | ICD-10-CM

## 2018-12-19 DIAGNOSIS — I2781 Cor pulmonale (chronic): Secondary | ICD-10-CM

## 2018-12-19 DIAGNOSIS — I10 Essential (primary) hypertension: Secondary | ICD-10-CM

## 2018-12-19 DIAGNOSIS — J9622 Acute and chronic respiratory failure with hypercapnia: Principal | ICD-10-CM

## 2018-12-19 LAB — BLOOD GAS, ARTERIAL
ACID-BASE EXCESS: 21.6 mmol/L — AB (ref 0.0–2.0)
Acid-Base Excess: 20.7 mmol/L — ABNORMAL HIGH (ref 0.0–2.0)
Bicarbonate: 47.9 mmol/L — ABNORMAL HIGH (ref 20.0–28.0)
Bicarbonate: 48.5 mmol/L — ABNORMAL HIGH (ref 20.0–28.0)
Delivery systems: POSITIVE
Delivery systems: POSITIVE
Drawn by: 330991
Drawn by: 237031
EXPIRATORY PAP: 4
Expiratory PAP: 6
Inspiratory PAP: 16
Inspiratory PAP: 18
O2 Content: 3 L/min
O2 Content: 3 L/min
O2 Saturation: 99.5 %
O2 Saturation: 98.5 %
PATIENT TEMPERATURE: 98.6
Patient temperature: 98.6
pCO2 arterial: 96.9 mmHg (ref 32.0–48.0)
pCO2 arterial: 90.2 mmHg (ref 32.0–48.0)
pH, Arterial: 7.315 — ABNORMAL LOW (ref 7.350–7.450)
pH, Arterial: 7.35 (ref 7.350–7.450)
pO2, Arterial: 289 mmHg — ABNORMAL HIGH (ref 83.0–108.0)
pO2, Arterial: 137 mmHg — ABNORMAL HIGH (ref 83.0–108.0)

## 2018-12-19 NOTE — Progress Notes (Signed)
Critical ABG results given to [facility], [place].

## 2018-12-19 NOTE — Progress Notes (Signed)
Pt placed on BIPAP at this time. BIPAP settings titrated again per MD for a pressure difference of >12. Current BIPAP settings are 18/4 2L O2 bled in. RT will continue to monitor.

## 2018-12-19 NOTE — Progress Notes (Signed)
Critical abg results paged to Provider X [place]. Awaiting call back

## 2018-12-19 NOTE — Progress Notes (Addendum)
PROGRESS NOTE    Jasmine [name]  DGL:[id number] DOB: 1945-12-22 DOA: 12/16/2018 PCP: Biagio Borg, MD    Brief Narrative:  73 year old female who presented with dyspnea and altered mentation. She does have significant past medical history of asthma, COPD with chronic hypoxic respiratory failure, anddiastolic heart failure.She was found unresponsive by her husband, apparently she has been recently prescribed BiPAP and her mask has been recently adjusted toimprove efficacy and compliance. When EMS arrived her oxygen saturation was 80% in the emergency department she was placed on the BiPAP. On her initial physical examination,confused and disoriented, on a full face mask BiPAP, blood pressure 119/65, heart rate 70, temperature 97.6, oxygen saturation 98%, respiratory rate18.His lungs had decreased breath sounds bilaterally, heart S1-S2 present and rhythmic, abdomen was soft nontender, no lower extremity edema. pH 7.28, PCO2 95.1, PaO2 65.0, HCO3 45.2. Her chest x-ray had significant hyperinflation, positive scaring in the right upper lobe.  Patient was admitted to the hospital with a working diagnosis of acute hypercapnic/hypoxic respiratory failure   Assessment & Plan:   Active Problems:   Essential hypertension   COPD (chronic obstructive pulmonary disease) (HCC)   Cor pulmonale (HCC)   Tobacco abuse disorder   Respiratory failure with hypercapnia (HCC)   Respiratory failure ([place])  1. Acute on chronic hypercapnic and hypoxic respiratory failure. Persistent hypercapnia up to 96.9 on bipap 16/4, down to 90 on 18/6. Even though the pH is above 7.30, still high risk for worsening respiratory acidosis, considering patient's acute presentation.  Will continue up-titration of bipap in the hospital, will target for a higher delta pressure tonight, greater than 12, may try 18/4, and follow ABG in am. Continue bronchoditlator therapy and oxymetry monitoring. Will target a pH of at least  greater than 7.35.   2. Hypothyroid. Continue levothyroxine.  3. Depression. Continue citalopram.  4. Diastolic heart failure. Has remained stable, will continue with oral furosemide, base home dose.  DVT prophylaxis:enoxaparin Code Status:full Family Communication:no family at the bedside Disposition Plan/ discharge barriers:pending clinical improvement, possible dc in am, if improved nocturnal ventilation.    Body mass index is 22.36 kg/m. Malnutrition Type:      Malnutrition Characteristics:      Nutrition Interventions:     RN Pressure Injury Documentation:     Consultants:     Procedures:     Antimicrobials:       Subjective: Patient is feeling better, able to tolerate bipap last night, but still not optimal ventilation with high Pc02, no nausea or vomiting. Ambulated with physical therapy yesterday, continue to get out of bed to chair.   Objective: Vitals:   12/18/18 2355 12/19/18 0816 12/19/18 0830 12/19/18 0910  BP: 125/62 126/71    Pulse: 78 (!) 57 63 60  Resp: 19 14 14 13   Temp: 98 F (36.7 C) (!) 97.3 F (36.3 C)    TempSrc: Oral Axillary    SpO2: 98% 100% 100% 100%  Weight:      Height:       No intake or output data in the 24 hours ending 12/19/18 1208 Filed Weights   12/16/18 1905  Weight: 51.9 kg    Examination:   General: deconditioned  Neurology: Awake and alert, non focal  E ENT: mild pallor, no icterus, oral mucosa moist Cardiovascular: No JVD. S1-S2 present, rhythmic, no gallops, rubs, or murmurs. No lower extremity edema. Pulmonary: positive breath sounds bilaterally, decreased air movement, no wheezing, scattered rhonchi and rales. Gastrointestinal. Abdomen with no organomegaly,  non tender, no rebound or guarding Skin. No rashes Musculoskeletal: no joint deformities     Data Reviewed: I have personally reviewed following labs and imaging studies  CBC: Recent Labs  Lab 12/16/18 1120  12/16/18 1251  WBC 4.7  --   NEUTROABS 3.4  --   HGB 14.6 16.0*  HCT 50.6* 47.0*  MCV 102.0*  --   PLT 180  --    Basic Metabolic Panel: Recent Labs  Lab 12/16/18 1120 12/16/18 1251 12/18/18 0320  NA 139 137 135  K 4.1 4.0 4.6  CL 93*  --  87*  CO2 35*  --  41*  GLUCOSE 112*  --  144*  BUN 9  --  18  CREATININE 0.80  --  0.66  CALCIUM 8.8*  --  9.8   GFR: Estimated Creatinine Clearance: 44.3 mL/min (by C-G formula based on SCr of 0.66 mg/dL). Liver Function Tests: No results for input(s): AST, ALT, ALKPHOS, BILITOT, PROT, ALBUMIN in the last 168 hours. No results for input(s): LIPASE, AMYLASE in the last 168 hours. No results for input(s): AMMONIA in the last 168 hours. Coagulation Profile: No results for input(s): INR, PROTIME in the last 168 hours. Cardiac Enzymes: Recent Labs  Lab 12/16/18 1350 12/16/18 1618 12/16/18 2250 12/17/18 0327  TROPONINI <0.03 <0.03 <0.03 <0.03   BNP (last 3 results) No results for input(s): PROBNP in the last 8760 hours. HbA1C: No results for input(s): HGBA1C in the last 72 hours. CBG: No results for input(s): GLUCAP in the last 168 hours. Lipid Profile: No results for input(s): CHOL, HDL, LDLCALC, TRIG, CHOLHDL, LDLDIRECT in the last 72 hours. Thyroid Function Tests: No results for input(s): TSH, T4TOTAL, FREET4, T3FREE, THYROIDAB in the last 72 hours. Anemia Panel: No results for input(s): VITAMINB12, FOLATE, FERRITIN, TIBC, IRON, RETICCTPCT in the last 72 hours.    Radiology Studies: I have reviewed all of the imaging during this hospital visit personally     Scheduled Meds: . aspirin  324 mg Oral Once  . aspirin EC  81 mg Oral Daily  . citalopram  20 mg Oral Daily  . enoxaparin (LOVENOX) injection  40 mg Subcutaneous Q24H  . fluticasone furoate-vilanterol  1 puff Inhalation Daily  . furosemide  20 mg Oral Daily  . levothyroxine  75 mcg Oral QAC breakfast  . sodium chloride flush  3 mL Intravenous Q12H    Continuous Infusions: . sodium chloride       LOS: 3 days        Mauricio Gerome Apley, MD

## 2018-12-20 ENCOUNTER — Other Ambulatory Visit: Payer: Self-pay | Admitting: *Deleted

## 2018-12-20 DIAGNOSIS — J9612 Chronic respiratory failure with hypercapnia: Secondary | ICD-10-CM

## 2018-12-20 LAB — BLOOD GAS, ARTERIAL
Acid-Base Excess: 20.2 mmol/L — ABNORMAL HIGH (ref 0.0–2.0)
Acid-Base Excess: 19 mmol/L — ABNORMAL HIGH (ref 0.0–2.0)
Bicarbonate: 47.4 mmol/L — ABNORMAL HIGH (ref 20.0–28.0)
Bicarbonate: 45.1 mmol/L — ABNORMAL HIGH (ref 20.0–28.0)
Delivery systems: POSITIVE
Delivery systems: POSITIVE
Drawn by: 330991
Drawn by: 301361
EXPIRATORY PAP: 6
Expiratory PAP: 4
FIO2: 35
Inspiratory PAP: 18
Inspiratory PAP: 18
Mode: POSITIVE
O2 Content: 3 L/min
O2 SAT: 96.4 %
O2 Saturation: 99.3 %
PATIENT TEMPERATURE: 98.6
PCO2 ART: 97.6 mmHg — AB (ref 32.0–48.0)
PCO2 ART: 73.3 mmHg — AB (ref 32.0–48.0)
PH ART: 7.307 — AB (ref 7.350–7.450)
Patient temperature: 97.5
RATE: 12 resp/min
pH, Arterial: 7.402 (ref 7.350–7.450)
pO2, Arterial: 245 mmHg — ABNORMAL HIGH (ref 83.0–108.0)
pO2, Arterial: 83.8 mmHg (ref 83.0–108.0)

## 2018-12-20 NOTE — Progress Notes (Signed)
Per Dr. RT to change  pt settings to 18 and 6.

## 2018-12-20 NOTE — Progress Notes (Signed)
PROGRESS NOTE    Jasmine [name]  STM:[id number] DOB: 1946-07-11 DOA: 12/16/2018 PCP: Biagio Borg, MD    Brief Narrative:  73 year old female who presented with dyspnea and altered mentation. She does have significant past medical history of asthma, COPD with chronic hypoxic respiratory failure, anddiastolic heart failure.She was found unresponsive by her husband, apparently she has been recently prescribed BiPAP and her mask has been recently adjusted toimprove efficacy and compliance. When EMS arrived her oxygen saturation was 80% in the emergency department she was placed on the BiPAP. On her initial physical examination,confused and disoriented, on a full face mask BiPAP, blood pressure 119/65, heart rate 70, temperature 97.6, oxygen saturation 98%, respiratory rate18.His lungs had decreased breath sounds bilaterally, heart S1-S2 present and rhythmic, abdomen was soft nontender, no lower extremity edema. pH 7.28, PCO2 95.1, PaO2 65.0, HCO3 45.2. Her chest x-ray had significant hyperinflation, positive scaring in the right upper lobe.  Patient was admitted to the hospital with a working diagnosis of acute hypercapnic/hypoxic respiratory failure   Assessment & Plan:   Active Problems:   Essential hypertension   COPD (chronic obstructive pulmonary disease) (HCC)   Cor pulmonale (HCC)   Tobacco abuse disorder   Respiratory failure with hypercapnia (HCC)   Respiratory failure ([place])   1. Acute on chronic hypercapnic and hypoxic respiratory failure.Contiue to failure ventilation with current bipap machine, with worsening respiratory acidosis (pH 7.30), will change bipap machine, and will use full face mask to do a trial of 18/4 cmH20 and with a 2 h ABG check, during the day, if tolerates well, continue same setting for  nocturanl bipap and do ABG in am. If improved ventilation will dc patient with same interface and bipap settings. Continue bronchoditlator therapy and oxymetry  monitoring.   2. Hypothyroid.On levothyroxine.  3. Depression.Oncitalopram.No confusion or agitation.   4. Diastolic heart failure. Continue to remain stable. On po furosemide.  DVT prophylaxis:enoxaparin  Code Status:full Family Communication:no family at the bedside Disposition Plan/ discharge barriers:pending clinical improvement, possible dc inam, if improved nocturnal ventilation.   Body mass index is 22.36 kg/m. Malnutrition Type:      Malnutrition Characteristics:      Nutrition Interventions:     RN Pressure Injury Documentation:     Consultants:     Procedures:     Antimicrobials:       Subjective: Patient uses bipap last night, this am with persistent significant hypercapnia and respiratory acidosis. Dyspnea is stable, no cough or wheezing.   Objective: Vitals:   12/20/18 0349 12/20/18 0716 12/20/18 0819 12/20/18 1202  BP: 138/72 119/73  116/62  Pulse: 72 68 67 78  Resp: 15 12 18 14   Temp: 98.2 F (36.8 C) 98.5 F (36.9 C)  98.1 F (36.7 C)  TempSrc: Oral Axillary  Oral  SpO2: 97% 100% 97% 97%  Weight:      Height:       No intake or output data in the 24 hours ending 12/20/18 1340 Filed Weights   12/16/18 1905  Weight: 51.9 kg    Examination:   General: deconditioned  Neurology: Awake and alert, non focal  E ENT: mild pallor, no icterus, oral mucosa moist Cardiovascular: No JVD. S1-S2 present, rhythmic, no gallops, rubs, or murmurs. No lower extremity edema. Pulmonary: positive breath sounds bilaterally, decreased air movement, no wheezing, scattered rhonchi and rales. Gastrointestinal. Abdomen with, no organomegaly, non tender, no rebound or guarding Skin. No rashes Musculoskeletal: no joint deformities     Data  Reviewed: I have personally reviewed following labs and imaging studies  CBC: Recent Labs  Lab 12/16/18 1120 12/16/18 1251  WBC 4.7  --   NEUTROABS 3.4  --   HGB 14.6 16.0*  HCT 50.6*  47.0*  MCV 102.0*  --   PLT 180  --    Basic Metabolic Panel: Recent Labs  Lab 12/16/18 1120 12/16/18 1251 12/18/18 0320  NA 139 137 135  K 4.1 4.0 4.6  CL 93*  --  87*  CO2 35*  --  41*  GLUCOSE 112*  --  144*  BUN 9  --  18  CREATININE 0.80  --  0.66  CALCIUM 8.8*  --  9.8   GFR: Estimated Creatinine Clearance: 44.3 mL/min (by C-G formula based on SCr of 0.66 mg/dL). Liver Function Tests: No results for input(s): AST, ALT, ALKPHOS, BILITOT, PROT, ALBUMIN in the last 168 hours. No results for input(s): LIPASE, AMYLASE in the last 168 hours. No results for input(s): AMMONIA in the last 168 hours. Coagulation Profile: No results for input(s): INR, PROTIME in the last 168 hours. Cardiac Enzymes: Recent Labs  Lab 12/16/18 1350 12/16/18 1618 12/16/18 2250 12/17/18 0327  TROPONINI <0.03 <0.03 <0.03 <0.03   BNP (last 3 results) No results for input(s): PROBNP in the last 8760 hours. HbA1C: No results for input(s): HGBA1C in the last 72 hours. CBG: No results for input(s): GLUCAP in the last 168 hours. Lipid Profile: No results for input(s): CHOL, HDL, LDLCALC, TRIG, CHOLHDL, LDLDIRECT in the last 72 hours. Thyroid Function Tests: No results for input(s): TSH, T4TOTAL, FREET4, T3FREE, THYROIDAB in the last 72 hours. Anemia Panel: No results for input(s): VITAMINB12, FOLATE, FERRITIN, TIBC, IRON, RETICCTPCT in the last 72 hours.    Radiology Studies: I have reviewed all of the imaging during this hospital visit personally     Scheduled Meds: . aspirin EC  81 mg Oral Daily  . citalopram  20 mg Oral Daily  . enoxaparin (LOVENOX) injection  40 mg Subcutaneous Q24H  . fluticasone furoate-vilanterol  1 puff Inhalation Daily  . furosemide  20 mg Oral Daily  . levothyroxine  75 mcg Oral QAC breakfast  . sodium chloride flush  3 mL Intravenous Q12H   Continuous Infusions: . sodium chloride       LOS: 4 days         Gerome Apley, MD

## 2018-12-20 NOTE — Care Management Important Message (Signed)
Important Message  Patient Details  Name: Jasmine [name] MRN: [id number] Date of Birth: 09/16/1946   Medicare Important Message Given:  Yes    Orbie Pyo 12/20/2018, 4:20 PM

## 2018-12-20 NOTE — Progress Notes (Signed)
Physician notified of ABG having multiple clots. Pt is a very difficult draw. Per Dr. Cathlean Sauer cancel morning ABG and continue PAP HS 18/6 35% with a full face mask as tolerated.

## 2018-12-20 NOTE — Progress Notes (Signed)
AM ABG cancelled per verbal order from MD.

## 2018-12-20 NOTE — Progress Notes (Addendum)
Pt currently 98% on 3L which is her baseline at home.  Per Dr. Cathlean Sauer pt to wear V60 Bipap at 18/6 with full face mask for two hours and a repeat ABG is to be drawn. RN and pt notified and verbalize understanding. Per physician okay to wait until after pt has ate lunch.

## 2018-12-20 NOTE — Patient Outreach (Signed)
[facility]) Care Management  12/20/2018  Jasmine [name] [birth year] [id number]   RN Health Coach Discipline Closure. Patient admitted to hospital.   [facility] Management [phone]

## 2018-12-20 NOTE — Progress Notes (Signed)
Critical ABG results given to [place], [place]

## 2018-12-20 NOTE — Progress Notes (Signed)
Critical ABG results sent to Dr. Cathlean Sauer via secure chat per physician request. Critical ABG results called to [place] RN.

## 2018-12-20 NOTE — Consult Note (Signed)
   [facility] Inpatient Consult   12/20/2018  Jasmine [name] 06/24/1946 [id number]   Patient is currently active with [facility] Care Management for chronic disease management services.  Patient has been engaged by a [facility] RN health Coach and previously with [facility] Pharmacist.  Our community based plan of care has focused on disease management and community resource support.  Patient will be followed for progress and disposition needs and followed as appropriate when transitioning from the hospital. Will follow with Inpatient Case Manager to make aware that [facility] following. Of note, [facility] Care Management services does not replace or interfere with any services that are needed or arranged by inpatient case management or social work.  For additional questions or referrals please contact:   Natividad Brood, RN BSN [facility] Liaison  [phone] business mobile phone Toll free office [phone]

## 2018-12-21 DIAGNOSIS — J9621 Acute and chronic respiratory failure with hypoxia: Secondary | ICD-10-CM

## 2018-12-21 MED ORDER — ALBUTEROL SULFATE (2.5 MG/3ML) 0.083% IN NEBU: 2.5000 mg | INHALATION_SOLUTION | Freq: Four times a day (QID) | RESPIRATORY_TRACT | 0 refills | Status: DC | PRN

## 2018-12-21 NOTE — Discharge Summary (Addendum)
Physician Discharge Summary  Jasmine [name]:[id number] DOB: [birth year] DOA: 12/16/2018  PCP: Biagio Borg, MD  Admit date: 12/16/2018 Discharge date: 12/21/2018  Admitted From: Home  Disposition:  Home   Recommendations for Outpatient Follow-up and new medication changes:  1. Follow up with [name] in 7 days.  2. Bipap settings have been changed to 18/6 with full face mask, with 35% Fi02.   Home Health: no   Equipment/Devices: home bipap   Discharge Condition: stable  CODE STATUS: full  Diet recommendation: heart healthy   Brief/Interim Summary: 73 year old female who presented with dyspnea and altered mentation. She does have significant past medical history of asthma, COPD with chronic hypoxic respiratory failure, anddiastolic heart failure.She was found unresponsive by her husband, apparently she has been recently prescribed BiPAP and her mask has been recently adjusted toimprove efficacy and compliance. When EMS arrived her oxygen saturation was 80% in the emergency department she was placed on a BiPAP. On her initial physical examination,she was confused and disoriented, on a full face mask BiPAP, blood pressure 119/65, heart rate 70, temperature 97.6, oxygen saturation 98%, respiratory rate18.His lungs had decreased breath sounds bilaterally, heart S1-S2 present and rhythmic, abdomen was soft nontender, no lower extremity edema. Arterial blood gas with pH 7.28, PCO2 95.1, PaO2 65.0, HCO3 45.2.  Sodium 139, potassium 4.1, chloride 93, bicarb 35, glucose 112, BUN 9, creatinine 0.8, white count 4.7, hemoglobin 14.6, hematocrit 50.6, platelets 180.  Head CT negative for acute changes.  Her chest x-ray had significant hyperinflation, positive scaring in the right upper lobe.  Her EKG had 86 bpm, had normal axis, normal intervals, right atrial enlargement and poor R progression, positive LVH.  Patient was admitted to the hospital with a working diagnosis of acute  hypercapnic/hypoxic respiratory failure.   1.  Acute on chronic hypoxic/hypercapnic respiratory failure related to advanced COPD.  Patient was admitted to the progressive care unit, she was placed on noninvasive mechanical ventilation.  Settings and interface were adjusted.  Currently she is on a BiPAP 18/6, full facemask, the pH has corrected to 7.40, with a PCO2 of 73.3, PO2 83.8, HCO3 45 and oxygen saturation 96.4.  Patient will be discharged on these same settings along with the same interface.  Patient will continue bronchodilator therapy with albuterol, fluticasone, vilanterol and tiotropium.  Supplemental oxygen per nasal cannula.  Patient had no significant wheezing, no worsening dyspnea or increased mucus production, rule out for a COPD exacerbation, systemic steroids were discontinued.  2.  Hypothyroid.  Patient will continue levothyroxine.  3.  Depression.  Continue citalopram.  4.  Diastolic heart failure/ chronic core pulmonale.  Patient remained stable, no signs of exacerbation, continue oral furosemide along with potassium supplements.  Point-of-care troponin was 0.22, troponin I was less than 0.03.  Further work-up with echocardiography, ejection fraction greater than 65%, moderately increased left ventricular wall thickness (moderate LVH), right ventricle had normal systolic function with mildly enlarged cavity.  No significant valvular disease.   5. Dyslipidemia. Continue atorvastatin.  6. Tobacco abuse. Smoking cessation counseling.   Discharge Diagnoses:  Active Problems:   Essential hypertension   COPD (chronic obstructive pulmonary disease) (HCC)   Cor pulmonale (HCC)   Tobacco abuse disorder   Respiratory failure with hypercapnia ([place])   Respiratory failure ([place])    Discharge Instructions   Allergies as of 12/21/2018      Reactions   Alendronate Sodium Other (See Comments)   Patient does not remember this reaction  Medication List    STOP taking these  medications   ipratropium-albuterol 0.5-2.5 (3) MG/3ML Soln Commonly known as:  DUONEB     TAKE these medications   aspirin 81 MG EC tablet Take 1 tablet (81 mg total) by mouth daily.   atorvastatin 10 MG tablet Commonly known as:  LIPITOR Take 1 tablet (10 mg total) by mouth daily.   citalopram 20 MG tablet Commonly known as:  CELEXA Take 1 tablet (20 mg total) by mouth daily.   fluticasone furoate-vilanterol 200-25 MCG/INH Aepb Commonly known as:  BREO ELLIPTA Inhale 1 puff into the lungs daily.   furosemide 40 MG tablet Commonly known as:  LASIX 1 tab by mouth in the AM daily, with 1 tab in the PM as needed for persistent swelling   levothyroxine 75 MCG tablet Commonly known as:  SYNTHROID, LEVOTHROID TAKE 1 TABLET BY MOUTH DAILY BEFORE BREAKFAST. What changed:  See the new instructions.   OXYGEN Inhale 3 L into the lungs. 24/7   potassium chloride 10 MEQ tablet Commonly known as:  K-DUR Take 1 tablet (10 mEq total) by mouth daily.   PRESCRIPTION MEDICATION Bipap   tiotropium 18 MCG inhalation capsule Commonly known as:  SPIRIVA Place 18 mcg into inhaler and inhale daily.   traMADol 50 MG tablet Commonly known as:  ULTRAM Take 1 tablet (50 mg total) by mouth 2 (two) times daily as needed.   VENTOLIN HFA 108 (90 Base) MCG/ACT inhaler Generic drug:  albuterol Inhale 2 puffs into the lungs every 6 (six) hours as needed for wheezing or shortness of breath. What changed:  Another medication with the same name was added. Make sure you understand how and when to take each.   albuterol (2.5 MG/3ML) 0.083% nebulizer solution Commonly known as:  PROVENTIL Take 3 mLs (2.5 mg total) by nebulization every 6 (six) hours as needed for wheezing or shortness of breath. What changed:  You were already taking a medication with the same name, and this prescription was added. Make sure you understand how and when to take each.            Durable Medical Equipment  (From  admission, onward)         Start     Ordered   12/21/18 0000  DME Other see comment    Comments:  Bipap 18/4, use FULL FACE MASK titrate fi02 to oxygen saturation greater than 88%.   12/21/18 0945          Allergies  Allergen Reactions  . Alendronate Sodium Other (See Comments)    Patient does not remember this reaction    Consultations:     Procedures/Studies: Ct Head Wo Contrast  Result Date: 12/16/2018 CLINICAL DATA:  Altered mental status EXAM: CT HEAD WITHOUT CONTRAST TECHNIQUE: Contiguous axial images were obtained from the base of the skull through the vertex without intravenous contrast. COMPARISON:  March 24, 2018 FINDINGS: Brain: The ventricles and sulci appear within normal limits for age and stable. There is no intracranial mass, hemorrhage, extra-axial fluid collection, or midline shift. Brain parenchyma appears unremarkable. No evident acute infarct. Vascular: No hyperdense vessel. There is calcification in each carotid siphon region. Skull: The bony calvarium appears intact. Ossification along the anterior falx is stable. Sinuses/Orbits: There is mucosal thickening in several ethmoid air cells. There is slight mucosal thickening in each anterior sphenoid sinus. Other visualized paranasal sinuses are clear. Orbits appear symmetric bilaterally. Other: Mastoid air cells are clear. IMPRESSION: Brain parenchyma appears  unremarkable.  No mass or hemorrhage. There are foci of arterial vascular calcification. There is mild paranasal sinus disease. Electronically Signed   By: Lowella Grip III M.D.   On: 12/16/2018 12:24   Ct Chest Wo Contrast  Result Date: 11/24/2018 CLINICAL DATA:  Follow-up lung nodule EXAM: CT CHEST WITHOUT CONTRAST TECHNIQUE: Multidetector CT imaging of the chest was performed following the standard protocol without IV contrast. COMPARISON:  12/16/2017 FINDINGS: Cardiovascular: Three-vessel coronary artery calcifications. Normal heart size. No pericardial  effusion. Mediastinum/Nodes: No enlarged mediastinal, hilar, or axillary lymph nodes. Thyroid gland, trachea, and esophagus demonstrate no significant findings. Lungs/Pleura: Moderate centrilobular emphysema. Multiple stable small pulmonary nodules of the upper lobes bilaterally. Unchanged bandlike scarring of the inferior right upper lobe. No pleural effusion or pneumothorax. Upper Abdomen: No acute abnormality. Musculoskeletal: No chest wall mass or suspicious bone lesions identified. IMPRESSION: 1. Stable small pulmonary nodules, likely benign sequelae of prior infection. No further routine CT follow-up is required for these nodules. 2.  Emphysema. 3.  Coronary artery disease. Electronically Signed   By: Eddie Candle M.D.   On: 11/24/2018 13:55   Dg Chest Portable 1 View  Result Date: 12/16/2018 CLINICAL DATA:  Shortness of breath. EXAM: PORTABLE CHEST 1 VIEW COMPARISON:  Chest CT 11/24/2018 and chest x-ray 05/25/2018 FINDINGS: The heart is mildly enlarged but stable. There is tortuosity and calcification of the thoracic aorta. Stable emphysematous changes and pulmonary scarring. No acute infiltrates, edema or effusions. No worrisome pulmonary lesions. Stable dense linear scarring changes in the right upper lobe. The bony thorax is intact. IMPRESSION: Chronic emphysematous and scarring changes but no acute overlying pulmonary process. Electronically Signed   By: Marijo Sanes M.D.   On: 12/16/2018 11:48       Subjective: Patient has tolerated well bipap with current settings, this am with no confusion or agitation, no chest pain, no dyspnea, no nausea or vomiting.   Discharge Exam: Vitals:   12/21/18 0726 12/21/18 0756  BP: 111/65   Pulse: 68   Resp: 13   Temp: (!) 97.4 F (36.3 C)   SpO2: 99% 96%   Vitals:   12/21/18 0300 12/21/18 0356 12/21/18 0726 12/21/18 0756  BP:  105/71 111/65   Pulse: 74 62 68   Resp: 15 14 13    Temp:  (!) 97.5 F (36.4 C) (!) 97.4 F (36.3 C)   TempSrc:   Oral Oral   SpO2: 100% 99% 99% 96%  Weight:      Height:        General: Not in pain or dyspnea.  Neurology: Awake and alert, non focal  E ENT: no pallor, no icterus, oral mucosa moist Cardiovascular: No JVD. S1-S2 present, rhythmic, no gallops, rubs, or murmurs. No lower extremity edema. Pulmonary: positive breath sounds bilaterally, decreased air movement, no wheezing, rhonchi or rales. Gastrointestinal. Abdomen with no organomegaly, non tender, no rebound or guarding Skin. No rashes Musculoskeletal: no joint deformities   The results of significant diagnostics from this hospitalization (including imaging, microbiology, ancillary and laboratory) are listed below for reference.     Microbiology: Recent Results (from the past 240 hour(s))  MRSA PCR Screening     Status: None   Collection Time: 12/16/18  5:17 PM  Result Value Ref Range Status   MRSA by PCR NEGATIVE NEGATIVE Final    Comment:        The GeneXpert MRSA Assay (FDA approved for NASAL specimens only), is one component of a comprehensive MRSA colonization surveillance  program. It is not intended to diagnose MRSA infection nor to guide or monitor treatment for MRSA infections. Performed at [facility], [place] [address], [place], [place] [address]      Labs: BNP (last 3 results) Recent Labs    04/21/18 1210 05/24/18 0840 12/16/18 1120  BNP 13.4 17.9 60.4   Basic Metabolic Panel: Recent Labs  Lab 12/16/18 1120 12/16/18 1251 12/18/18 0320  NA 139 137 135  K 4.1 4.0 4.6  CL 93*  --  87*  CO2 35*  --  41*  GLUCOSE 112*  --  144*  BUN 9  --  18  CREATININE 0.80  --  0.66  CALCIUM 8.8*  --  9.8   Liver Function Tests: No results for input(s): AST, ALT, ALKPHOS, BILITOT, PROT, ALBUMIN in the last 168 hours. No results for input(s): LIPASE, AMYLASE in the last 168 hours. No results for input(s): AMMONIA in the last 168 hours. CBC: Recent Labs  Lab 12/16/18 1120 12/16/18 1251  WBC 4.7  --    NEUTROABS 3.4  --   HGB 14.6 16.0*  HCT 50.6* 47.0*  MCV 102.0*  --   PLT 180  --    Cardiac Enzymes: Recent Labs  Lab 12/16/18 1350 12/16/18 1618 12/16/18 2250 12/17/18 0327  TROPONINI <0.03 <0.03 <0.03 <0.03   BNP: Invalid input(s): POCBNP CBG: No results for input(s): GLUCAP in the last 168 hours. D-Dimer No results for input(s): DDIMER in the last 72 hours. Hgb A1c No results for input(s): HGBA1C in the last 72 hours. Lipid Profile No results for input(s): CHOL, HDL, LDLCALC, TRIG, CHOLHDL, LDLDIRECT in the last 72 hours. Thyroid function studies No results for input(s): TSH, T4TOTAL, T3FREE, THYROIDAB in the last 72 hours.  Invalid input(s): FREET3 Anemia work up No results for input(s): VITAMINB12, FOLATE, FERRITIN, TIBC, IRON, RETICCTPCT in the last 72 hours. Urinalysis    Component Value Date/Time   COLORURINE YELLOW 09/20/2018 1426   APPEARANCEUR CLEAR 09/20/2018 1426   LABSPEC 1.010 09/20/2018 1426   PHURINE 7.0 09/20/2018 1426   GLUCOSEU NEGATIVE 09/20/2018 1426   HGBUR NEGATIVE 09/20/2018 1426   BILIRUBINUR NEGATIVE 09/20/2018 1426   BILIRUBINUR negative 08/11/2011 1358   KETONESUR NEGATIVE 09/20/2018 1426   PROTEINUR 30 (A) 03/24/2018 1925   UROBILINOGEN 0.2 09/20/2018 1426   NITRITE NEGATIVE 09/20/2018 1426   LEUKOCYTESUR NEGATIVE 09/20/2018 1426   Sepsis Labs Invalid input(s): PROCALCITONIN,  WBC,  LACTICIDVEN Microbiology Recent Results (from the past 240 hour(s))  MRSA PCR Screening     Status: None   Collection Time: 12/16/18  5:17 PM  Result Value Ref Range Status   MRSA by PCR NEGATIVE NEGATIVE Final    Comment:        The GeneXpert MRSA Assay (FDA approved for NASAL specimens only), is one component of a comprehensive MRSA colonization surveillance program. It is not intended to diagnose MRSA infection nor to guide or monitor treatment for MRSA infections. Performed at [facility], [place] [address] [place], [place]  [address]      Time coordinating discharge: 45 minutes  SIGNED:   Tawni Millers, MD  [facility] 12/21/2018, 9:24 AM

## 2018-12-21 NOTE — Plan of Care (Signed)
  Problem: Education: Goal: Knowledge of General Education information will improve Description Including pain rating scale, medication(s)/side effects and non-pharmacologic comfort measures Outcome: Progressing   Problem: Clinical Measurements: Goal: Will remain free from infection Outcome: Progressing   Problem: Clinical Measurements: Goal: Respiratory complications will improve Outcome: Progressing   Problem: Activity: Goal: Risk for activity intolerance will decrease Outcome: Progressing   Problem: Safety: Goal: Ability to remain free from injury will improve Outcome: Progressing

## 2018-12-21 NOTE — Progress Notes (Signed)
..  Patient given all discharge paper work. All pages reviewed and subsequent questions answered. Patients telemetry and peripheral IV discontinued without complications. Patients belongings returned. Patient placed in wheel chair and escorted by staff member to pick up location with home oxygen concentrator.   English as a second language teacher

## 2018-12-22 ENCOUNTER — Telehealth: Payer: Self-pay | Admitting: *Deleted

## 2018-12-22 ENCOUNTER — Other Ambulatory Visit: Payer: Self-pay | Admitting: Pulmonary Disease

## 2018-12-22 NOTE — Telephone Encounter (Signed)
Transition Care Management Follow-up Telephone Call   Date discharged? 12/21/18   How have you been since you were released from the hospital? Pt states she is doing ok   Do you understand why you were in the hospital? YES   Do you understand the discharge instructions? YES   Where were you discharged to? Home   Items Reviewed:  Medications reviewed: YES  Allergies reviewed: YES  Dietary changes reviewed: YES  Referrals reviewed: No referral needed   Functional Questionnaire:   Activities of Daily Living (ADLs):   She states she are independent in the following: bathing and hygiene, feeding, continence, grooming, toileting and dressing States they require assistance with the following: ambulation    Any transportation issues/concerns?: NO   Any patient concerns? NO   Confirmed importance and date/time of follow-up visits scheduled YES, appt 12/28/18  Provider Appointment booked with [name]   Confirmed with patient if condition begins to worsen call PCP or go to the ER.  Patient was given the office number and encouraged to call back with question or concerns.  : YES

## 2018-12-23 ENCOUNTER — Other Ambulatory Visit: Payer: Self-pay | Admitting: *Deleted

## 2018-12-23 LAB — GLUCOSE, CAPILLARY: Glucose-Capillary: 123 mg/dL — ABNORMAL HIGH (ref 70–99)

## 2018-12-23 NOTE — Patient Outreach (Signed)
Jasmine [name]  12/23/2018  Jasmine [name] [birth year] [id number]   Referral received from hospital liaison as member was recently discharged from hospital (12/21/2018) after being admitted with complications of COPD.  Primary MD office will complete transition of care assessment.  Per chart, she also has history of hypertension, heart failure, hypothyroidism, and asthma.    Call placed to member, identity verified.  This care manager introduced self and stated purpose of call.  She report she is doing better.  State her CPAP mask and settings were changed upon discharge.  She is now wearing a full face mask.  Remains on home O2 at 3 liters while awake.  State her concentrator was just replaced by [name].  Voices concern regarding her CPAP mask not fitting correctly and causing some swelling on one side of her face, but state she will contact [facility] regarding concern.    This care manager reviewed medications with member, including proper use of inhalers.  She state she use her rescue inhaler daily as a "boost" when getting out of bed.  Advised that it is used for emergencies only, state she feel she will continue to need to use as she does not take her other inhalers until later in the morning.  Advised to reconsider routine in order to follow medication instructions, verbalizes understanding.  Denies any urgent concerns.  Has follow up appointment next week on 3/10 with primary MD, denies need for transportation at this.  [facility] CM Care Plan Problem One     Most Recent Value  Care Plan Problem One  Knowledge Deficit in Self [name] of COPD as evidenced by recent hospital admission  Role Documenting the Problem One  Care [name] for Problem One  Active  [facility] Long Term Goal   Member will not have any readmission for COPD within the next 45 days  [facility] Long Term Goal Start Date  12/23/18  Interventions for Problem One Long Term Goal   Discharge instructions reviewed with member, advised of complications if not adherent to care plan  [facility] CM Short Term Goal #1   Member will report taking medications as prescribed over the next 4 weeks  [facility] CM Short Term Goal #1 Start Date  12/23/18  Interventions for Short Term Goal #1  Medications reveiwed with member, especially use of inhalers at correct times.  assessed the need for pharmacy involvement  [facility] CM Short Term Goal #2   Member will keep and attend follow up appointment with primary MD within the next 2 weeks  [facility] CM Short Term Goal #2 Start Date  12/23/18  Interventions for Short Term Goal #2  Upcoming appointment reviewed with member.  Assessed for the need for transportation assistance     Valente David, [place], MSN [place] [phone]

## 2018-12-24 DIAGNOSIS — J449 Chronic obstructive pulmonary disease, unspecified: Secondary | ICD-10-CM | POA: Diagnosis not present

## 2018-12-27 DIAGNOSIS — R7881 Bacteremia: Secondary | ICD-10-CM | POA: Diagnosis not present

## 2018-12-27 DIAGNOSIS — J449 Chronic obstructive pulmonary disease, unspecified: Secondary | ICD-10-CM | POA: Diagnosis not present

## 2018-12-27 DIAGNOSIS — B9561 Methicillin susceptible Staphylococcus aureus infection as the cause of diseases classified elsewhere: Secondary | ICD-10-CM | POA: Diagnosis not present

## 2018-12-27 DIAGNOSIS — J9612 Chronic respiratory failure with hypercapnia: Secondary | ICD-10-CM | POA: Diagnosis not present

## 2018-12-28 ENCOUNTER — Encounter: Payer: Self-pay | Admitting: Internal Medicine

## 2018-12-28 ENCOUNTER — Ambulatory Visit (INDEPENDENT_AMBULATORY_CARE_PROVIDER_SITE_OTHER): Payer: Medicare HMO | Admitting: Internal Medicine

## 2018-12-28 VITALS — BP 122/64 | HR 63 | Temp 98.6°F | Ht 60.0 in | Wt 116.0 lb

## 2018-12-28 DIAGNOSIS — Z9981 Dependence on supplemental oxygen: Secondary | ICD-10-CM | POA: Diagnosis not present

## 2018-12-28 DIAGNOSIS — I10 Essential (primary) hypertension: Secondary | ICD-10-CM | POA: Diagnosis not present

## 2018-12-28 DIAGNOSIS — J441 Chronic obstructive pulmonary disease with (acute) exacerbation: Secondary | ICD-10-CM | POA: Diagnosis not present

## 2018-12-28 DIAGNOSIS — J9690 Respiratory failure, unspecified, unspecified whether with hypoxia or hypercapnia: Secondary | ICD-10-CM | POA: Diagnosis not present

## 2018-12-28 DIAGNOSIS — J9622 Acute and chronic respiratory failure with hypercapnia: Secondary | ICD-10-CM

## 2018-12-28 DIAGNOSIS — J432 Centrilobular emphysema: Secondary | ICD-10-CM

## 2018-12-28 NOTE — Assessment & Plan Note (Signed)
stable overall by history and exam, recent data reviewed with pt, and pt to continue medical treatment as before,  to f/u any worsening symptoms or concerns  

## 2018-12-28 NOTE — Patient Instructions (Signed)
Please continue all other medications as before, and refills have been done if requested.  Please have the pharmacy call with any other refills you may need.  Please continue your efforts at being more active, low cholesterol diet, and weight control.  Please keep your appointments with your specialists as you may have planned - pulmonary

## 2018-12-28 NOTE — Progress Notes (Signed)
Subjective:    Patient ID: Jasmine [name], female    DOB: 12-Dec-1945, 73 y.o.   MRN: [id number]  HPI 73 year old female who presented with dyspnea and altered mentation. She does have significant past medical history of asthma, COPD with chronic hypoxic respiratory failure, anddiastolic heart failure.She was found unresponsive by her husband, apparently she has been recently prescribed BiPAP and her mask has been recently adjusted toimprove efficacy and compliance. When EMS arrived her oxygen saturation was 80% in the emergency department she was placed on a BiPAP. On her initial physical examination,she was confused and disoriented, on a full face mask BiPAP, blood pressure 119/65, heart rate 70, temperature 97.6, oxygen saturation 98%, respiratory rate18.   Arterial blood gas with pH 7.28, PCO2 95.1, PaO2 65.0, HCO3 45.2   Patient was admitted to the hospital with a working diagnosis of acute hypercapnic/hypoxic respiratory failure. she was placed on noninvasive mechanical ventilation.  Settings and interface were adjusted. she is on a BiPAP 18/6, full facemask, the pH has corrected to 7.40, with a PCO2 of 73.3, PO2 83.8, HCO3 45 and oxygen saturation 96.4.  Patient will be discharged on these same settings along with the same interface.   Patient will continue bronchodilator therapy with albuterol, fluticasone, vilanterol  Patient remained stable, no signs of exacerbation, continue oral furosemide along with potassium supplements and tiotropium.  Supplemental oxygen per nasal cannula. Diastolic heart failure/ chronic core pulmonale.  Patient remained stable, no signs of exacerbation, continue oral furosemide along with potassium supplements.  On chronic home o2 3L.   Pt denies fever, wt loss, night sweats, loss of appetite, or other constitutional symptoms.  Pt denies chest pain, increased sob or doe, wheezing, orthopnea, PND, increased LE swelling, palpitations, dizziness or syncope.  Drove  herself here.  No other new complaints Past Medical History:  Diagnosis Date  . ALLERGIC RHINITIS 05/01/2007  . Anxiety   . Arthritis    "hands" (12/28/2015)  . ASTHMA 05/01/2007   "since I was a child"  . Cancer (HCC)    LUNG  . CHF (congestive heart failure) ([place])   . Chronic bronchitis ([place])   . COPD (chronic obstructive pulmonary disease) ([place])   . DEPRESSION 07/21/2008  . DVT (deep venous thrombosis) (HCC) 1960s   LLE  . FATIGUE 10/10/2010  . HYPERTENSION 05/01/2007  . HYPERTHYROIDISM 11/23/2007   Pt endorses having had Graves disease, possibly radioactive iodine x1, but no thyroidectomy  . HYPOTHYROIDISM 08/23/2009  . LUMBAR RADICULOPATHY, RIGHT 08/25/2008  . On home oxygen therapy    "3-4L; qd; all the time" (12/28/2015)  . OSTEOPOROSIS 05/01/2007  . SHOULDER PAIN, LEFT 08/23/2009  . SINUSITIS, CHRONIC 10/10/2010   Past Surgical History:  Procedure Laterality Date  . APPENDECTOMY    . DILATION AND CURETTAGE OF UTERUS  multiple   history of multiple dialations and curettages and miscarriages, unfortunately never carrying a child to term  . ECTOPIC PREGNANCY SURGERY  "late '60s or early '70s  . ELECTROCARDIOGRAM  06/20/2006  . OOPHORECTOMY Right     reports that she quit smoking about 3 years ago. Her smoking use included cigarettes. She has a 54.00 pack-year smoking history. She has never used smokeless tobacco. She reports current alcohol use of about 2.0 standard drinks of alcohol per week. She reports previous drug use. family history includes Alcohol abuse in her brother; Asthma in an other family member; Diabetes in her brother; Hypertension in an other family member; Lung cancer in her father; Stroke in  her mother; Thyroid disease in her mother. Allergies  Allergen Reactions  . Alendronate Sodium Other (See Comments)    Patient does not remember this reaction   Current Outpatient Medications on File Prior to Visit  Medication Sig Dispense Refill  . albuterol (PROVENTIL)  (2.5 MG/3ML) 0.083% nebulizer solution Take 3 mLs (2.5 mg total) by nebulization every 6 (six) hours as needed for wheezing or shortness of breath. 75 mL 0  . aspirin EC 81 MG EC tablet Take 1 tablet (81 mg total) by mouth daily. 30 tablet 0  . atorvastatin (LIPITOR) 10 MG tablet Take 1 tablet (10 mg total) by mouth daily. 90 tablet 3  . citalopram (CELEXA) 20 MG tablet Take 1 tablet (20 mg total) by mouth daily. 90 tablet 3  . fluticasone furoate-vilanterol (BREO ELLIPTA) 200-25 MCG/INH AEPB Inhale 1 puff into the lungs daily. 1 each 5  . furosemide (LASIX) 40 MG tablet 1 tab by mouth in the AM daily, with 1 tab in the PM as needed for persistent swelling 180 tablet 3  . levothyroxine (SYNTHROID, LEVOTHROID) 75 MCG tablet TAKE 1 TABLET BY MOUTH DAILY BEFORE BREAKFAST. (Patient taking differently: Take 75 mcg by mouth daily before breakfast. ) 90 tablet 1  . OXYGEN Inhale 3 L into the lungs. 24/7    . potassium chloride (K-DUR) 10 MEQ tablet Take 1 tablet (10 mEq total) by mouth daily. 90 tablet 3  . PRESCRIPTION MEDICATION Bipap    . tiotropium (SPIRIVA) 18 MCG inhalation capsule Place 18 mcg into inhaler and inhale daily.    . traMADol (ULTRAM) 50 MG tablet Take 1 tablet (50 mg total) by mouth 2 (two) times daily as needed. 60 tablet 2  . VENTOLIN HFA 108 (90 Base) MCG/ACT inhaler INHALE 1 TO 2 PUFFS INTO THE LUNGS EVERY 6 (SIX) HOURS AS NEEDED FOR WHEEZING OR SHORTNESS OF BREATH. 18 g 5   No current facility-administered medications on file prior to visit.    Review of Systems  Constitutional: Negative for other unusual diaphoresis or sweats HENT: Negative for ear discharge or swelling Eyes: Negative for other worsening visual disturbances Respiratory: Negative for stridor or other swelling  Gastrointestinal: Negative for worsening distension or other blood Genitourinary: Negative for retention or other urinary change Musculoskeletal: Negative for other MSK pain or swelling Skin: Negative  for color change or other new lesions Neurological: Negative for worsening tremors and other numbness  Psychiatric/Behavioral: Negative for worsening agitation or other fatigue All other system neg per pt    Objective:   Physical Exam BP 122/64   Pulse 63   Temp 98.6 F (37 C) (Oral)   Ht 5' (1.524 m)   Wt 116 lb (52.6 kg)   SpO2 100%   BMI 22.65 kg/m  VS noted,  Constitutional: Pt appears in NAD HENT: Head: NCAT.  Right Ear: External ear normal.  Left Ear: External ear normal.  Eyes: . Pupils are equal, round, and reactive to light. Conjunctivae and EOM are normal Nose: without d/c or deformity Neck: Neck supple. Gross normal ROM Cardiovascular: Normal rate and regular rhythm.   Pulmonary/Chest: Effort normal and breath sounds without rales or wheezing.  Abd:  Soft, NT, ND, + BS, no organomegaly Neurological: Pt is alert. At baseline orientation, motor grossly intact Skin: Skin is warm. No rashes, other new lesions, no LE edema Psychiatric: Pt behavior is normal without agitation  No other exam findings  Lab Results  Component Value Date   WBC 4.7 12/16/2018  HGB 16.0 (H) 12/16/2018   HCT 47.0 (H) 12/16/2018   PLT 180 12/16/2018   GLUCOSE 144 (H) 12/18/2018   CHOL 250 (H) 09/20/2018   TRIG 62.0 09/20/2018   HDL 98.60 09/20/2018   LDLDIRECT 102.1 10/10/2010   LDLCALC 139 (H) 09/20/2018   ALT 12 09/20/2018   AST 22 09/20/2018   NA 135 12/18/2018   K 4.6 12/18/2018   CL 87 (L) 12/18/2018   CREATININE 0.66 12/18/2018   BUN 18 12/18/2018   CO2 41 (H) 12/18/2018   TSH 1.11 09/20/2018   INR 1.58 (H) 03/25/2016   HGBA1C 6.1 09/20/2018       Assessment & Plan:

## 2018-12-31 ENCOUNTER — Telehealth: Payer: Self-pay | Admitting: Internal Medicine

## 2018-12-31 ENCOUNTER — Other Ambulatory Visit: Payer: Self-pay

## 2018-12-31 LAB — COLOGUARD: COLOGUARD: NEGATIVE

## 2018-12-31 NOTE — Telephone Encounter (Signed)
Copied from [place] [phone]. Topic: Quick Communication - See Telephone Encounter >> Dec 31, 2018  1:14 PM Vernona Rieger wrote: CRM for notification. See Telephone encounter for: 12/31/18.  Patient would like to know has Dr Jenny Reichmann received her colo-guard results?

## 2019-01-01 DIAGNOSIS — R7881 Bacteremia: Secondary | ICD-10-CM | POA: Diagnosis not present

## 2019-01-01 DIAGNOSIS — J9612 Chronic respiratory failure with hypercapnia: Secondary | ICD-10-CM | POA: Diagnosis not present

## 2019-01-01 DIAGNOSIS — B9561 Methicillin susceptible Staphylococcus aureus infection as the cause of diseases classified elsewhere: Secondary | ICD-10-CM | POA: Diagnosis not present

## 2019-01-01 DIAGNOSIS — J449 Chronic obstructive pulmonary disease, unspecified: Secondary | ICD-10-CM | POA: Diagnosis not present

## 2019-01-03 ENCOUNTER — Other Ambulatory Visit: Payer: Self-pay | Admitting: Pulmonary Disease

## 2019-01-03 NOTE — Telephone Encounter (Signed)
Pt has been informed that we have not received the results yet. I will reach out to her after PCP has reviewed them.

## 2019-01-04 DIAGNOSIS — R911 Solitary pulmonary nodule: Secondary | ICD-10-CM | POA: Diagnosis not present

## 2019-01-04 DIAGNOSIS — J9611 Chronic respiratory failure with hypoxia: Secondary | ICD-10-CM | POA: Diagnosis not present

## 2019-01-04 DIAGNOSIS — J449 Chronic obstructive pulmonary disease, unspecified: Secondary | ICD-10-CM | POA: Diagnosis not present

## 2019-01-04 DIAGNOSIS — R300 Dysuria: Secondary | ICD-10-CM | POA: Diagnosis not present

## 2019-01-06 ENCOUNTER — Other Ambulatory Visit: Payer: Self-pay | Admitting: *Deleted

## 2019-01-06 NOTE — Patient Outreach (Signed)
[place] [facility]) Care Management  01/06/2019  GENELLE ECONOMOU Nov 03, 1945 [id number]   Call placed to member to follow up on current medical condition and office visit with MD.  She report she is doing much better, is using her CPAP as instructed.  State she has not decreased the use of her rescue inhaler since using the CPAP more.  Denies any shortness of breath at this time.  This care manager inquired about plan to limit exposure to Covid-19, state she is practicing social distance.  Has friends/family members that are able to help her out if needed.  Denies any urgent concerns at this time, will follow up within the next 2 weeks.  THN CM Care Plan Problem One     Most Recent Value  Care Plan Problem One  Knowledge Deficit in Self management of COPD as evidenced by recent hospital admission  Role Documenting the Problem One  Care Management [place] for Problem One  Active  [facility] Long Term Goal   Member will not have any readmission for COPD within the next 45 days  THN Long Term Goal Start Date  12/23/18  Interventions for Problem One Long Term Goal  Complications of unmanaged COPD reviewed with member, advised to importance of following care plan  [facility] CM Short Term Goal #1   Member will report taking medications as prescribed over the next 4 weeks  THN CM Short Term Goal #1 Start Date  12/23/18  Interventions for Short Term Goal #1  Reviewed proper use of inhalers with member, including times of administration versus using rescue inhaler  THN CM Short Term Goal #2   Member will keep and attend follow up appointment with primary MD within the next 2 weeks  THN CM Short Term Goal #2 Start Date  12/23/18  [facility] CM Short Term Goal #2 Met Date  01/06/19     Valente David, RN, MSN [place] [phone]

## 2019-01-11 ENCOUNTER — Other Ambulatory Visit: Payer: BC Managed Care – PPO | Admitting: Internal Medicine

## 2019-01-11 DIAGNOSIS — Z515 Encounter for palliative care: Secondary | ICD-10-CM | POA: Diagnosis not present

## 2019-01-17 ENCOUNTER — Other Ambulatory Visit: Payer: Self-pay | Admitting: *Deleted

## 2019-01-17 ENCOUNTER — Other Ambulatory Visit: Payer: Self-pay

## 2019-01-17 NOTE — Patient Outreach (Signed)
[place] [facility]) Care Management  01/17/2019  TONJI ELLIFF 1945/11/11 [id number]   Call placed to member to complete initial assessment.  She report she is doing well, following state orders for social distancing and shelter in place in effort to decrease risk of exposure to Covid-19.  Denies needing assistance with refilling medications and food.  State she continue to drive and has her ex-husband there to help when needed.  She does state that she now is only getting about 4-5 hours on her CPAP during the night, feel there is air leaking after about 4 hours. State she has a person that comes in monthly to assess the machine from [facility], report it is time for a maintenance check.  She will call them to come look at machine, will contact pulmonologist office to request visit if she is unable to get more relief once the machine is serviced.    Denies any urgent concerns at this time, denies shortness of breath.  Advised to contact this care manager with questions.  Will follow up within the next month.  Valente David, [place], MSN [place] [phone]

## 2019-01-24 DIAGNOSIS — J449 Chronic obstructive pulmonary disease, unspecified: Secondary | ICD-10-CM | POA: Diagnosis not present

## 2019-01-27 ENCOUNTER — Ambulatory Visit: Payer: Self-pay | Admitting: *Deleted

## 2019-01-27 DIAGNOSIS — B9561 Methicillin susceptible Staphylococcus aureus infection as the cause of diseases classified elsewhere: Secondary | ICD-10-CM | POA: Diagnosis not present

## 2019-01-27 DIAGNOSIS — J449 Chronic obstructive pulmonary disease, unspecified: Secondary | ICD-10-CM | POA: Diagnosis not present

## 2019-01-27 DIAGNOSIS — J9612 Chronic respiratory failure with hypercapnia: Secondary | ICD-10-CM | POA: Diagnosis not present

## 2019-01-27 DIAGNOSIS — R7881 Bacteremia: Secondary | ICD-10-CM | POA: Diagnosis not present

## 2019-02-01 DIAGNOSIS — J9612 Chronic respiratory failure with hypercapnia: Secondary | ICD-10-CM | POA: Diagnosis not present

## 2019-02-01 DIAGNOSIS — R7881 Bacteremia: Secondary | ICD-10-CM | POA: Diagnosis not present

## 2019-02-01 DIAGNOSIS — J449 Chronic obstructive pulmonary disease, unspecified: Secondary | ICD-10-CM | POA: Diagnosis not present

## 2019-02-01 DIAGNOSIS — B9561 Methicillin susceptible Staphylococcus aureus infection as the cause of diseases classified elsewhere: Secondary | ICD-10-CM | POA: Diagnosis not present

## 2019-02-03 ENCOUNTER — Other Ambulatory Visit: Payer: Self-pay | Admitting: *Deleted

## 2019-02-03 NOTE — Patient Outreach (Signed)
[name]) Care Management  02/03/2019  Jasmine [name] May 06, 1946 [id number]   Call placed to member to follow up on COPD management.  Denies any shortness of breath at this time.  Report she has been using her CPAP but has also been working with [facility] to provide replacement supplies and cleansing system.  State they have discussed routine cleaning with her, which she has not been doing "as often as I should."  She will follow up with them as she was told they would have more information for her within the week.  Denies any urgent concerns, next appointment with primary MD is 6/3, next appointment with pulmonologist is 7/9.  Advised to contact with questions.  Will follow up within the next month.  THN CM Care Plan Problem One     Most Recent Value  Care Plan Problem One  Knowledge Deficit in Self management of COPD as evidenced by recent hospital admission  Role Documenting the Problem One  Care Management [place] for Problem One  Active  [facility] Long Term Goal   Member will not have any readmission for COPD within the next 45 days  THN Long Term Goal Start Date  12/23/18  Interventions for Problem One Long Term Goal  Reviewed use of inhalers (rescue and scheduled) with member, advised of importance of using as instructed in effort to decrease risk of complication  THN CM Short Term Goal #1   Member will report taking medications as prescribed over the next 4 weeks  THN CM Short Term Goal #1 Start Date  12/23/18  [facility] CM Short Term Goal #1 Met Date  02/03/19  THN CM Short Term Goal #2   Member will report having new cleaning system for CPAP within the next 4 weeks  THN CM Short Term Goal #2 Start Date  02/03/19  Interventions for Short Term Goal #2  Advised to contact [place] to follow up on supplies and cleansing system for CPAP.  Confirmed she has contact information.     Valente David, [place], MSN [place] [phone]

## 2019-02-04 DIAGNOSIS — J9611 Chronic respiratory failure with hypoxia: Secondary | ICD-10-CM | POA: Diagnosis not present

## 2019-02-04 DIAGNOSIS — R300 Dysuria: Secondary | ICD-10-CM | POA: Diagnosis not present

## 2019-02-04 DIAGNOSIS — R911 Solitary pulmonary nodule: Secondary | ICD-10-CM | POA: Diagnosis not present

## 2019-02-04 DIAGNOSIS — J449 Chronic obstructive pulmonary disease, unspecified: Secondary | ICD-10-CM | POA: Diagnosis not present

## 2019-02-08 ENCOUNTER — Other Ambulatory Visit: Payer: Self-pay

## 2019-02-08 ENCOUNTER — Other Ambulatory Visit: Payer: BC Managed Care – PPO | Admitting: Internal Medicine

## 2019-02-08 DIAGNOSIS — Z515 Encounter for palliative care: Secondary | ICD-10-CM | POA: Diagnosis not present

## 2019-02-08 NOTE — Progress Notes (Signed)
    PALLIATIVE CARE CONSULT VISIT   PATIENT NAME: Jasmine [name] DOB: Jul 18, 1946 MRN: [id number]  PRIMARY CARE PROVIDER:   Biagio Borg, MD  REFERRING PROVIDER:  Biagio Borg, MD [place], [place] [address]  RESPONSIBLE PARTY:   Self  RECOMMENDATIONS and PLAN: Palliative Care Encounter:  Z51.5   1.  Shortness of breath:  At baseline since adjustment of BiPap which she uses nightly. Maintaining sats > 90  Continuous use of O2 3L/min.    .  2.  Edema due to congestive heart failure:  Minimimal on Lasix daily.  Continue compression socks and leg elevation.  CHF management techniques reinforced.  3.  Advanced care planning:  Reviewed advanced directives. She desires CPR and intubation.  MOST form is up to date with full scope of treatment.  Trial tube feedings. Her goals are to continue to manage chronic respiratory and cardiac health issues while living at home.  She has some assistance with home duties from her female friend. Palliative care will F/U in aprox. 1 month.   I spent 60 minutes providing this consultation,  from 11:30am to 12:15pm at home. More than 50% of the time in this consultation was spent coordinating communication with patient and medical record review.   HISTORY OF PRESENT ILLNESS: Followup with [name] Patient was hospitalized from 2/27-12/21/18 due to hypoxia and respiratory failure. She improved and adjustments of the BiPap settings were made.  She reports that she is very mildly short of breath with exertion occasionally.  She is still able to perform her own ADLs but does require some assistance with applying the BiPap mask.  Appetite is good.    CODE STATUS:  FULL CODE  PPS: 50% HOSPICE ELIGIBILITY/DIAGNOSIS: TBD  PAST MEDICAL HISTORY:  Past Medical History:  Diagnosis Date  . ALLERGIC RHINITIS 05/01/2007  . Anxiety   . Arthritis    "hands" (12/28/2015)  . ASTHMA 05/01/2007   "since I was a child"  . Cancer (HCC)    LUNG  . CHF  (congestive heart failure) ([place])   . Chronic bronchitis ([place])   . COPD (chronic obstructive pulmonary disease) ([place])   . DEPRESSION 07/21/2008  . DVT (deep venous thrombosis) (HCC) 1960s   LLE  . FATIGUE 10/10/2010  . HYPERTENSION 05/01/2007  . HYPERTHYROIDISM 11/23/2007   Pt endorses having had Graves disease, possibly radioactive iodine x1, but no thyroidectomy  . HYPOTHYROIDISM 08/23/2009  . LUMBAR RADICULOPATHY, RIGHT 08/25/2008  . On home oxygen therapy    "3-4L; qd; all the time" (12/28/2015)  . OSTEOPOROSIS 05/01/2007  . SHOULDER PAIN, LEFT 08/23/2009  . SINUSITIS, CHRONIC 10/10/2010     PHYSICAL EXAM:   General: Well appearing elderly female.  In NAD Cardiovascular: regular rate and rhythm Pulmonary: Lungs are clear. No increased respiratory effort.  O2 via [place] in use Abdomen: soft, nontender, + bowel sounds Extremities: Trace edema BLE Skin: no rashes, exposed skin is intact Neurological: A&O x 3  Gonzella Lex, NP-C

## 2019-02-08 NOTE — Progress Notes (Signed)
    PALLIATIVE CARE CONSULT VISIT   PATIENT NAME: Jasmine [name] DOB: [birth year] MRN: [id number]  PRIMARY CARE PROVIDER:   Biagio Borg, MD  REFERRING PROVIDER:  Biagio Borg, MD [place], [place] [address]  RESPONSIBLE PARTY:   Self  RECOMMENDATIONS and PLAN: Palliative Care Encounter:  Z51.5   1.  Shortness of breath:  At baseline since adjustment of BiPap which she uses nightly. Maintaining sats > 90  Continuous use of O2 3L/min.   Monitor closely and avoid home community pollutants/infectious disease. .  2.  Edema due to congestive heart failure:  Minimimal on Lasix daily.  Continue compression socks and leg elevation.  CHF management techniques reinforced.  3.  Advanced care planning:  Reviewed advanced directives. She desires CPR and intubation.  MOST form is up to date with full scope of treatment.  Trial tube feedings. Her goals are to continue to manage chronic respiratory and cardiac health issues while living at home.  She has some assistance with home duties from her former husband. Palliative care will F/U in aprox. 1 month.  Due to the COVID-19 crisis, this telephone evaluation  was performed via phone from my office and it was initiated and consented by this patient.    I spent 40 minutes providing this consultation,  from 11:30am to 12:10pm via phone. More than 50% of the time in this consultation was spent coordinating communication with patient and medical record review.   HISTORY OF PRESENT ILLNESS: Followup with [name].   Patient was hospitalized from 2/27-12/21/18 due to hypoxia and respiratory failure. She improved and adjustments of the BiPap settings were made.  She reports that she is very mildly short of breath with exertion occasionally.  She is still able to perform her own ADLs but does require some assistance with applying the BiPap mask.  Appetite is good.    CODE STATUS:  FULL CODE  PPS: 50% HOSPICE ELIGIBILITY/DIAGNOSIS: TBD  PAST MEDICAL HISTORY:  Past Medical History:  Diagnosis Date  . ALLERGIC RHINITIS 05/01/2007  . Anxiety   . Arthritis    "hands" (12/28/2015)  . ASTHMA 05/01/2007   "since I was a child"  . Cancer (HCC)    LUNG  . CHF (congestive heart failure) ([place])   . Chronic bronchitis ([place])   . COPD (chronic obstructive pulmonary disease) ([place])   . DEPRESSION 07/21/2008  . DVT (deep venous thrombosis) (HCC) 1960s   LLE  . FATIGUE 10/10/2010  . HYPERTENSION 05/01/2007  . HYPERTHYROIDISM 11/23/2007   Pt endorses having had Graves disease, possibly radioactive iodine x1, but no thyroidectomy  . HYPOTHYROIDISM 08/23/2009  . LUMBAR RADICULOPATHY, RIGHT 08/25/2008  . On home oxygen therapy    "3-4L; qd; all the time" (12/28/2015)  . OSTEOPOROSIS 05/01/2007  . SHOULDER PAIN, LEFT 08/23/2009  . SINUSITIS, CHRONIC 10/10/2010     PHYSICAL EXAM:   General: Unable to visualize Pulmonary:  Fluid speech.  No obvious shortness of breath Skin: no rashes, exposed skin is intact Neurological: A&O x 3  Gonzella Lex, NP-C

## 2019-02-10 ENCOUNTER — Encounter: Payer: Self-pay | Admitting: Internal Medicine

## 2019-02-23 DIAGNOSIS — J449 Chronic obstructive pulmonary disease, unspecified: Secondary | ICD-10-CM | POA: Diagnosis not present

## 2019-02-26 DIAGNOSIS — J449 Chronic obstructive pulmonary disease, unspecified: Secondary | ICD-10-CM | POA: Diagnosis not present

## 2019-02-26 DIAGNOSIS — J9612 Chronic respiratory failure with hypercapnia: Secondary | ICD-10-CM | POA: Diagnosis not present

## 2019-02-26 DIAGNOSIS — B9561 Methicillin susceptible Staphylococcus aureus infection as the cause of diseases classified elsewhere: Secondary | ICD-10-CM | POA: Diagnosis not present

## 2019-02-26 DIAGNOSIS — R7881 Bacteremia: Secondary | ICD-10-CM | POA: Diagnosis not present

## 2019-03-01 ENCOUNTER — Ambulatory Visit: Payer: Medicare HMO | Admitting: Adult Health

## 2019-03-03 ENCOUNTER — Other Ambulatory Visit: Payer: Self-pay | Admitting: *Deleted

## 2019-03-03 DIAGNOSIS — J449 Chronic obstructive pulmonary disease, unspecified: Secondary | ICD-10-CM | POA: Diagnosis not present

## 2019-03-03 DIAGNOSIS — R7881 Bacteremia: Secondary | ICD-10-CM | POA: Diagnosis not present

## 2019-03-03 DIAGNOSIS — J9612 Chronic respiratory failure with hypercapnia: Secondary | ICD-10-CM | POA: Diagnosis not present

## 2019-03-03 DIAGNOSIS — B9561 Methicillin susceptible Staphylococcus aureus infection as the cause of diseases classified elsewhere: Secondary | ICD-10-CM | POA: Diagnosis not present

## 2019-03-03 NOTE — Patient Outreach (Signed)
[facility] [facility]) Care Management  03/03/2019  KRISTIANNA SAPERSTEIN [birth year] [id number]   Call placed to member to follow up on COPD management.  She report she is doing well, denies shortness of breath at this time or chest discomfort.  State she has not been using her rescue inhaler daily but instead taking her time and using deep breathing techniques as well as her CPAP at night.  She has also been using her nebulizers and scheduled inhalers as instructed with positive benefits.  This care manager inquired about the cleaning system for the CPAP, state she was told that her insurance wouldn't pay for it so she is trying to compare prices of different systems in effort to find what best suits her needs at this time.  She will have her niece help with comparison and decision.    Denies any concern of lack of food or other resources during Covid pandemic.  Denies any questions regarding her health at this time.  Discussed transition to health coach for continued disease management of COPD, she agrees.  Will place referral and notify primary MD of transition.  THN CM Care Plan Problem One     Most Recent Value  Care Plan Problem One  Knowledge Deficit in Self management of COPD as evidenced by recent hospital admission  Role Documenting the Problem One  Care Management [place] for Problem One  Active  [facility] Long Term Goal   Member will not have any readmission for COPD within the next 45 days  THN Long Term Goal Start Date  12/23/18  [facility] Long Term Goal Met Date  03/03/19  [facility] CM Short Term Goal #2   Member will report having new cleaning system for CPAP within the next 4 weeks  THN CM Short Term Goal #2 Start Date  02/03/19  [facility] CM Short Term Goal #2 Met Date  -- [Not met]     Valente David, RN, MSN [place] [phone]

## 2019-03-06 ENCOUNTER — Emergency Department (HOSPITAL_COMMUNITY): Payer: Medicare HMO

## 2019-03-06 ENCOUNTER — Other Ambulatory Visit: Payer: Self-pay

## 2019-03-06 ENCOUNTER — Inpatient Hospital Stay (HOSPITAL_COMMUNITY)
Admission: EM | Admit: 2019-03-06 | Discharge: 2019-03-10 | DRG: 190 | Disposition: A | Discharge status: Home / Self Care | Payer: Medicare HMO | Attending: Internal Medicine | Admitting: Internal Medicine

## 2019-03-06 ENCOUNTER — Encounter (HOSPITAL_COMMUNITY): Payer: Self-pay | Admitting: Oncology

## 2019-03-06 DIAGNOSIS — M19041 Primary osteoarthritis, right hand: Secondary | ICD-10-CM | POA: Diagnosis present

## 2019-03-06 DIAGNOSIS — J9601 Acute respiratory failure with hypoxia: Secondary | ICD-10-CM | POA: Diagnosis not present

## 2019-03-06 DIAGNOSIS — M5416 Radiculopathy, lumbar region: Secondary | ICD-10-CM | POA: Diagnosis present

## 2019-03-06 DIAGNOSIS — R300 Dysuria: Secondary | ICD-10-CM | POA: Diagnosis not present

## 2019-03-06 DIAGNOSIS — J439 Emphysema, unspecified: Secondary | ICD-10-CM | POA: Diagnosis not present

## 2019-03-06 DIAGNOSIS — Z720 Tobacco use: Secondary | ICD-10-CM | POA: Diagnosis not present

## 2019-03-06 DIAGNOSIS — E872 Acidosis: Secondary | ICD-10-CM | POA: Diagnosis present

## 2019-03-06 DIAGNOSIS — Z8349 Family history of other endocrine, nutritional and metabolic diseases: Secondary | ICD-10-CM

## 2019-03-06 DIAGNOSIS — Z888 Allergy status to other drugs, medicaments and biological substances status: Secondary | ICD-10-CM

## 2019-03-06 DIAGNOSIS — J441 Chronic obstructive pulmonary disease with (acute) exacerbation: Secondary | ICD-10-CM | POA: Diagnosis present

## 2019-03-06 DIAGNOSIS — J9611 Chronic respiratory failure with hypoxia: Secondary | ICD-10-CM | POA: Diagnosis not present

## 2019-03-06 DIAGNOSIS — E039 Hypothyroidism, unspecified: Secondary | ICD-10-CM | POA: Diagnosis present

## 2019-03-06 DIAGNOSIS — Z801 Family history of malignant neoplasm of trachea, bronchus and lung: Secondary | ICD-10-CM

## 2019-03-06 DIAGNOSIS — I10 Essential (primary) hypertension: Secondary | ICD-10-CM | POA: Diagnosis present

## 2019-03-06 DIAGNOSIS — Z7982 Long term (current) use of aspirin: Secondary | ICD-10-CM | POA: Diagnosis not present

## 2019-03-06 DIAGNOSIS — Z86718 Personal history of other venous thrombosis and embolism: Secondary | ICD-10-CM

## 2019-03-06 DIAGNOSIS — I5032 Chronic diastolic (congestive) heart failure: Secondary | ICD-10-CM | POA: Diagnosis present

## 2019-03-06 DIAGNOSIS — Z7951 Long term (current) use of inhaled steroids: Secondary | ICD-10-CM

## 2019-03-06 DIAGNOSIS — Z7722 Contact with and (suspected) exposure to environmental tobacco smoke (acute) (chronic): Secondary | ICD-10-CM | POA: Diagnosis present

## 2019-03-06 DIAGNOSIS — J9622 Acute and chronic respiratory failure with hypercapnia: Secondary | ICD-10-CM | POA: Diagnosis present

## 2019-03-06 DIAGNOSIS — Z9981 Dependence on supplemental oxygen: Secondary | ICD-10-CM | POA: Diagnosis not present

## 2019-03-06 DIAGNOSIS — Z87891 Personal history of nicotine dependence: Secondary | ICD-10-CM | POA: Diagnosis present

## 2019-03-06 DIAGNOSIS — D696 Thrombocytopenia, unspecified: Secondary | ICD-10-CM | POA: Diagnosis present

## 2019-03-06 DIAGNOSIS — I2781 Cor pulmonale (chronic): Secondary | ICD-10-CM | POA: Diagnosis present

## 2019-03-06 DIAGNOSIS — J449 Chronic obstructive pulmonary disease, unspecified: Secondary | ICD-10-CM | POA: Diagnosis not present

## 2019-03-06 DIAGNOSIS — R69 Illness, unspecified: Secondary | ICD-10-CM | POA: Diagnosis not present

## 2019-03-06 DIAGNOSIS — Z79890 Hormone replacement therapy (postmenopausal): Secondary | ICD-10-CM | POA: Diagnosis not present

## 2019-03-06 DIAGNOSIS — J9621 Acute and chronic respiratory failure with hypoxia: Secondary | ICD-10-CM | POA: Diagnosis present

## 2019-03-06 DIAGNOSIS — I110 Hypertensive heart disease with heart failure: Secondary | ICD-10-CM | POA: Diagnosis present

## 2019-03-06 DIAGNOSIS — Z515 Encounter for palliative care: Secondary | ICD-10-CM | POA: Diagnosis present

## 2019-03-06 DIAGNOSIS — R0902 Hypoxemia: Secondary | ICD-10-CM

## 2019-03-06 DIAGNOSIS — R0689 Other abnormalities of breathing: Secondary | ICD-10-CM | POA: Diagnosis not present

## 2019-03-06 DIAGNOSIS — J9602 Acute respiratory failure with hypercapnia: Secondary | ICD-10-CM | POA: Diagnosis not present

## 2019-03-06 DIAGNOSIS — Z90721 Acquired absence of ovaries, unilateral: Secondary | ICD-10-CM

## 2019-03-06 DIAGNOSIS — Z79899 Other long term (current) drug therapy: Secondary | ICD-10-CM

## 2019-03-06 DIAGNOSIS — Z20828 Contact with and (suspected) exposure to other viral communicable diseases: Secondary | ICD-10-CM | POA: Diagnosis present

## 2019-03-06 DIAGNOSIS — R911 Solitary pulmonary nodule: Secondary | ICD-10-CM | POA: Diagnosis not present

## 2019-03-06 DIAGNOSIS — Z825 Family history of asthma and other chronic lower respiratory diseases: Secondary | ICD-10-CM

## 2019-03-06 DIAGNOSIS — I959 Hypotension, unspecified: Secondary | ICD-10-CM | POA: Diagnosis not present

## 2019-03-06 DIAGNOSIS — Z8249 Family history of ischemic heart disease and other diseases of the circulatory system: Secondary | ICD-10-CM

## 2019-03-06 DIAGNOSIS — M810 Age-related osteoporosis without current pathological fracture: Secondary | ICD-10-CM | POA: Diagnosis present

## 2019-03-06 DIAGNOSIS — R5381 Other malaise: Secondary | ICD-10-CM | POA: Diagnosis not present

## 2019-03-06 DIAGNOSIS — M19042 Primary osteoarthritis, left hand: Secondary | ICD-10-CM | POA: Diagnosis present

## 2019-03-06 DIAGNOSIS — Z85118 Personal history of other malignant neoplasm of bronchus and lung: Secondary | ICD-10-CM

## 2019-03-06 DIAGNOSIS — F418 Other specified anxiety disorders: Secondary | ICD-10-CM | POA: Diagnosis present

## 2019-03-06 DIAGNOSIS — E8729 Other acidosis: Secondary | ICD-10-CM

## 2019-03-06 DIAGNOSIS — R4182 Altered mental status, unspecified: Secondary | ICD-10-CM | POA: Diagnosis not present

## 2019-03-06 LAB — CBG MONITORING, ED: Glucose-Capillary: 105 mg/dL — ABNORMAL HIGH (ref 70–99)

## 2019-03-06 MED ORDER — SODIUM CHLORIDE 0.9 % IV SOLN
INTRAVENOUS | Status: DC
  Administered 2019-03-07 – 2019-03-08 (×3): via INTRAVENOUS

## 2019-03-06 NOTE — ED Provider Notes (Addendum)
[facility] EMERGENCY DEPARTMENT Provider Note  CSN: [id number] Arrival date & time: 03/06/19 2220  Chief Complaint(s) Weakness  HPI Jasmine [name] is a 73 y.o. female extensive past medical history listed below including COPD on 3 L nasal cannula at home, grade 1 diastolic heart failure who presents to the emergency department with 1 day of fatigue and lethargy.  Patient was brought in by EMS was called out to the home for her lethargy.  Patient is unable to provide history due to her lethargy.  Remainder of history, ROS, and physical exam limited due to patient's condition (AMS). Additional information was obtained from EMS and significant other.   Level V Caveat.    I called her significant other Mr. Tamala Julian who reported that the patient was at her normal state of health up until this morning.  He reports that she has not been out of bed since last night.  Reports that she uses her BiPAP as she supposed to and has been using her home oxygen.  Reported that she did partake in more strenuous activity yesterday while trying to organize things at home however she was not complaining of anything yesterday.  Patient was initially responding to her significant other throughout the day.  He called EMS around 1700 who evaluated the patient and did not feel she needed to be brought in.  They were called out again due to her worsening lethargy.  Significant other denies any recent fevers or infections.  No cough.  No vomiting or diarrhea.  HPI  Past Medical History Past Medical History:  Diagnosis Date  . ALLERGIC RHINITIS 05/01/2007  . Anxiety   . Arthritis    "hands" (12/28/2015)  . ASTHMA 05/01/2007   "since I was a child"  . Cancer (HCC)    LUNG  . CHF (congestive heart failure) ([place])   . Chronic bronchitis ([place])   . COPD (chronic obstructive pulmonary disease) ([place])   . DEPRESSION 07/21/2008  . DVT (deep venous thrombosis) (HCC) 1960s   LLE  . FATIGUE 10/10/2010  .  HYPERTENSION 05/01/2007  . HYPERTHYROIDISM 11/23/2007   Pt endorses having had Graves disease, possibly radioactive iodine x1, but no thyroidectomy  . HYPOTHYROIDISM 08/23/2009  . LUMBAR RADICULOPATHY, RIGHT 08/25/2008  . On home oxygen therapy    "3-4L; qd; all the time" (12/28/2015)  . OSTEOPOROSIS 05/01/2007  . SHOULDER PAIN, LEFT 08/23/2009  . SINUSITIS, CHRONIC 10/10/2010   Patient Active Problem List   Diagnosis Date Noted  . Respiratory failure ([place]) 12/16/2018  . Preventative health care 09/20/2018  . Edema due to congestive heart failure ([place]) 07/27/2018  . Palliative care by specialist   . Shortness of breath   . Chronic low back pain 04/30/2018  . COPD with acute exacerbation ([place])   . Hypothyroidism   . Anxiety   . Respiratory failure with hypercapnia ([place]) 03/24/2018  . Hypercarbia 03/22/2018  . Polysubstance abuse ([place]) 01/26/2018  . Aspiration into airway 06/03/2017  . Rash 07/12/2016  . Bilateral hearing loss 06/05/2016  . Hyperglycemia 05/21/2016  . Abnormal LFTs 03/24/2016  . Elevated blood sugar 03/24/2016  . Solitary pulmonary nodule 01/09/2016  . Chronic respiratory failure with hypoxia ([place])   . On home oxygen therapy   . Chronic diastolic congestive heart failure ([place])   . Bacteremia Step viridans 12/27/2015  . Tobacco abuse disorder 12/25/2015  . Hypothyroidism following radioiodine therapy 09/26/2014  . Protein-calorie malnutrition, severe ([place]) 06/23/2014  . Cor pulmonale ([place]) 01/12/2013  .  COPD (chronic obstructive pulmonary disease) ([place]) 11/29/2011  . Macrocytosis without anemia 11/09/2011  . Palliative care encounter 04/09/2011  . SINUSITIS, CHRONIC 10/10/2010  . FATIGUE 10/10/2010  . Depression with anxiety 07/21/2008  . Essential hypertension 05/01/2007  . ALLERGIC RHINITIS 05/01/2007  . ASTHMA 05/01/2007  . OSTEOPOROSIS 05/01/2007   Home Medication(s) Prior to Admission medications   Medication Sig Start Date End Date Taking? Authorizing  Provider  albuterol (PROVENTIL) (2.5 MG/3ML) 0.083% nebulizer solution Take 3 mLs (2.5 mg total) by nebulization every 6 (six) hours as needed for wheezing or shortness of breath. 12/21/18   Arrien, Jimmy Picket, MD  aspirin EC 81 MG EC tablet Take 1 tablet (81 mg total) by mouth daily. 03/27/16   Velvet Bathe, MD  atorvastatin (LIPITOR) 10 MG tablet Take 1 tablet (10 mg total) by mouth daily. 09/20/18 09/20/19  Biagio Borg, MD  citalopram (CELEXA) 20 MG tablet Take 1 tablet (20 mg total) by mouth daily. 06/16/18   Biagio Borg, MD  fluticasone furoate-vilanterol (BREO ELLIPTA) 200-25 MCG/INH AEPB Inhale 1 puff into the lungs daily. 05/28/18   Robbie Lis, MD  furosemide (LASIX) 40 MG tablet 1 tab by mouth in the AM daily, with 1 tab in the PM as needed for persistent swelling 06/16/18   Biagio Borg, MD  levothyroxine (SYNTHROID, LEVOTHROID) 75 MCG tablet TAKE 1 TABLET BY MOUTH DAILY BEFORE BREAKFAST. Patient taking differently: Take 75 mcg by mouth daily before breakfast.  10/29/18   Biagio Borg, MD  OXYGEN Inhale 3 L into the lungs. 24/7    [provider]  potassium chloride (K-DUR) 10 MEQ tablet Take 1 tablet (10 mEq total) by mouth daily. 09/22/18   Biagio Borg, MD  [place]    [provider]  SPIRIVA HANDIHALER 18 MCG inhalation capsule PLACE 1 CAPSULE (18 MCG TOTAL) INTO INHALER AND INHALE DAILY. 01/03/19   Rigoberto Noel, MD  traMADol (ULTRAM) 50 MG tablet Take 1 tablet (50 mg total) by mouth 2 (two) times daily as needed. 04/30/18   Biagio Borg, MD  VENTOLIN HFA 108 (90 Base) MCG/ACT inhaler INHALE 1 TO 2 PUFFS INTO THE LUNGS EVERY 6 (SIX) HOURS AS NEEDED FOR WHEEZING OR SHORTNESS OF BREATH. 12/22/18   Rigoberto Noel, MD                                                                                                                                    Past Surgical History Past Surgical History:  Procedure Laterality Date  . APPENDECTOMY    .  DILATION AND CURETTAGE OF UTERUS  multiple   history of multiple dialations and curettages and miscarriages, unfortunately never carrying a child to term  . ECTOPIC PREGNANCY SURGERY  "late '60s or early '70s  . ELECTROCARDIOGRAM  06/20/2006  . OOPHORECTOMY Right    Family History Family History  Problem Relation Age  of Onset  . Lung cancer Father   . Alcohol abuse Brother   . Diabetes Brother   . Hypertension Other   . Stroke Mother   . Thyroid disease Mother   . Asthma Other        maternal aunts  . Breast cancer Neg Hx     Social History Social History   Tobacco Use  . Smoking status: Former Smoker    Packs/day: 1.00    Years: 54.00    Pack years: 54.00    Types: Cigarettes    Last attempt to quit: 07/21/2015    Years since quitting: 3.6  . Smokeless tobacco: Never Used  Substance Use Topics  . Alcohol use: Yes    Alcohol/week: 2.0 standard drinks    Types: 2 Standard drinks or equivalent per week    Comment: wine coolers  . Drug use: Not Currently   Allergies Alendronate sodium  Review of Systems Review of Systems  Unable to perform ROS: Mental status change    Physical Exam Vital Signs  I have reviewed the triage vital signs BP 139/60   Pulse 87   Temp 98.7 F (37.1 C) (Rectal)   Resp (!) 24   Ht 5' (1.524 m)   Wt 49.9 kg   SpO2 100%   BMI 21.48 kg/m   Physical Exam Vitals signs reviewed.  Constitutional:      General: She is not in acute distress.    Appearance: She is well-developed. She is not diaphoretic.  HENT:     Head: Normocephalic and atraumatic.     Nose: Nose normal.  Eyes:     General: No scleral icterus.       Right eye: No discharge.        Left eye: No discharge.     Conjunctiva/sclera: Conjunctivae normal.     Pupils: Pupils are equal, round, and reactive to light.  Neck:     Musculoskeletal: Normal range of motion and neck supple.  Cardiovascular:     Rate and Rhythm: Normal rate and regular rhythm.     Heart sounds: No  murmur. No friction rub. No gallop.   Pulmonary:     Effort: Pulmonary effort is normal. No respiratory distress.     Breath sounds: Normal breath sounds. No stridor. No rales.  Abdominal:     General: There is no distension.     Palpations: Abdomen is soft.     Tenderness: There is no abdominal tenderness.  Musculoskeletal:        General: No tenderness.  Skin:    General: Skin is warm and dry.     Findings: No erythema or rash.  Neurological:     Mental Status: She is lethargic.     Comments: Oriented to self and place. Slow to respond.     ED Results and Treatments Labs (all labs ordered are listed, but only abnormal results are displayed) Labs Reviewed  COMPREHENSIVE METABOLIC PANEL - Abnormal; Notable for the following components:      Result Value   Chloride 93 (*)    CO2 39 (*)    Glucose, Bld 107 (*)    All other components within normal limits  CBC WITH DIFFERENTIAL/PLATELET - Abnormal; Notable for the following components:   HCT 48.7 (*)    MCV 106.8 (*)    MCHC 28.5 (*)    All other components within normal limits  URINALYSIS, COMPLETE (UACMP) WITH MICROSCOPIC - Abnormal; Notable for the following components:  APPearance HAZY (*)    Protein, ur 100 (*)    All other components within normal limits  TSH - Abnormal; Notable for the following components:   TSH 0.272 (*)    All other components within normal limits  BLOOD GAS, ARTERIAL - Abnormal; Notable for the following components:   pH, Arterial 7.17 (*)    pO2, Arterial 114 (*)    Bicarbonate 44.5 (*)    All other components within normal limits  CBG MONITORING, ED - Abnormal; Notable for the following components:   Glucose-Capillary 105 (*)    All other components within normal limits  SARS CORONAVIRUS 2 (HOSPITAL ORDER, [facility])  BRAIN NATRIURETIC PEPTIDE  I-STAT TROPONIN, ED                                                                                                                          EKG  EKG Interpretation  Date/Time:  Monday Mar 07 2019 00:06:04 EDT Ventricular Rate:  83 PR Interval:    QRS Duration: 87 QT Interval:  305 QTC Calculation: 359 R Axis:   89 Text Interpretation:  Sinus rhythm Right atrial enlargement Borderline right axis deviation Consider left ventricular hypertrophy No significant change since last tracing Confirmed by Addison Lank [phone]) on 03/07/2019 1:29:23 AM      Radiology Dg Chest 2 View  Result Date: 03/06/2019 CLINICAL DATA:  Lethargy EXAM: CHEST - 2 VIEW COMPARISON:  12/16/2018 FINDINGS: Cardiomegaly. Nodular opacity of the right upper lobe unchanged from prior. Emphysematous change. No new or focal airspace opacity. IMPRESSION: Cardiomegaly. Nodular opacity of the right upper lobe unchanged from prior. Emphysematous change. No new or focal airspace opacity. Electronically Signed   By: Eddie Candle M.D.   On: 03/06/2019 23:58   Pertinent labs & imaging results that were available during my care of the patient were reviewed by me and considered in my medical decision making (see chart for details).  Medications Ordered in ED Medications  0.9 %  sodium chloride infusion ( Intravenous New Bag/Given 03/07/19 0042)                                                                                                                                    Procedures Procedures CRITICAL CARE Performed by: Grayce Sessions Nina Hoar Total critical care time: 30 minutes Critical care time was exclusive of separately billable procedures  and treating other patients. Critical care was necessary to treat or prevent imminent or life-threatening deterioration. Critical care was time spent personally by me on the following activities: development of treatment plan with patient and/or surrogate as well as nursing, discussions with consultants, evaluation of patient's response to treatment, examination of patient, obtaining history from patient or  surrogate, ordering and performing treatments and interventions, ordering and review of laboratory studies, ordering and review of radiographic studies, pulse oximetry and re-evaluation of patient's condition.   (including critical care time)  Medical Decision Making / ED Course I have reviewed the nursing notes for this encounter and the patient's prior records (if available in EHR or on provided paperwork).    Patient presents with lethargy and somnolence.  Afebrile with stable vital signs.  Work-up notable for respiratory acidosis with hypercarbia which is likely the cause of her symptoms.  Patient is satting well on her home 3 L nasal cannula but will be placed on BiPAP. CXR w/o PNA or pleural effusion. No PTX.  Rest of the work-up reassuring without leukocytosis or anemia.  No significant electrolyte derangements or renal insufficiency.  TSH not consistent with myxedema.  UA negative.  Will admit patient to medicine for continued management.  Final Clinical Impression(s) / ED Diagnoses Final diagnoses:  Hypoxia  Hypercarbia  Respiratory acidosis      This chart was dictated using voice recognition software.  Despite best efforts to proofread,  errors can occur which can change the documentation meaning.     Fatima Blank, MD 03/07/19 [phone]

## 2019-03-06 NOTE — ED Triage Notes (Signed)
Pt bib [facility] from home d/t weakness.  Pt's family states she was lethargic and not acting like herself.  Pt is on chronic 3L of O2.  EMS placed pt on non rebreather w/ improvement in mental status and O2 sat. Pt's O2 79% on RA placed back on non rebreather. For EMS pt was A&O x 4.

## 2019-03-06 NOTE — ED Notes (Signed)
Patient transported to X-ray 

## 2019-03-06 NOTE — ED Notes (Signed)
Checked CBG 105

## 2019-03-07 DIAGNOSIS — I10 Essential (primary) hypertension: Secondary | ICD-10-CM

## 2019-03-07 DIAGNOSIS — I5032 Chronic diastolic (congestive) heart failure: Secondary | ICD-10-CM

## 2019-03-07 DIAGNOSIS — Z7982 Long term (current) use of aspirin: Secondary | ICD-10-CM | POA: Diagnosis not present

## 2019-03-07 DIAGNOSIS — M810 Age-related osteoporosis without current pathological fracture: Secondary | ICD-10-CM | POA: Diagnosis present

## 2019-03-07 DIAGNOSIS — Z515 Encounter for palliative care: Secondary | ICD-10-CM | POA: Diagnosis present

## 2019-03-07 DIAGNOSIS — Z7722 Contact with and (suspected) exposure to environmental tobacco smoke (acute) (chronic): Secondary | ICD-10-CM | POA: Diagnosis present

## 2019-03-07 DIAGNOSIS — Z86718 Personal history of other venous thrombosis and embolism: Secondary | ICD-10-CM | POA: Diagnosis not present

## 2019-03-07 DIAGNOSIS — J9602 Acute respiratory failure with hypercapnia: Secondary | ICD-10-CM

## 2019-03-07 DIAGNOSIS — M19041 Primary osteoarthritis, right hand: Secondary | ICD-10-CM | POA: Diagnosis present

## 2019-03-07 DIAGNOSIS — E872 Acidosis: Secondary | ICD-10-CM

## 2019-03-07 DIAGNOSIS — E8729 Other acidosis: Secondary | ICD-10-CM

## 2019-03-07 DIAGNOSIS — E039 Hypothyroidism, unspecified: Secondary | ICD-10-CM

## 2019-03-07 DIAGNOSIS — Z87891 Personal history of nicotine dependence: Secondary | ICD-10-CM | POA: Diagnosis not present

## 2019-03-07 DIAGNOSIS — J441 Chronic obstructive pulmonary disease with (acute) exacerbation: Secondary | ICD-10-CM | POA: Diagnosis present

## 2019-03-07 DIAGNOSIS — I110 Hypertensive heart disease with heart failure: Secondary | ICD-10-CM | POA: Diagnosis present

## 2019-03-07 DIAGNOSIS — M5416 Radiculopathy, lumbar region: Secondary | ICD-10-CM | POA: Diagnosis present

## 2019-03-07 DIAGNOSIS — J9601 Acute respiratory failure with hypoxia: Secondary | ICD-10-CM | POA: Diagnosis present

## 2019-03-07 DIAGNOSIS — J9611 Chronic respiratory failure with hypoxia: Secondary | ICD-10-CM | POA: Diagnosis not present

## 2019-03-07 DIAGNOSIS — Z7951 Long term (current) use of inhaled steroids: Secondary | ICD-10-CM | POA: Diagnosis not present

## 2019-03-07 DIAGNOSIS — M19042 Primary osteoarthritis, left hand: Secondary | ICD-10-CM | POA: Diagnosis present

## 2019-03-07 DIAGNOSIS — Z9981 Dependence on supplemental oxygen: Secondary | ICD-10-CM | POA: Diagnosis not present

## 2019-03-07 DIAGNOSIS — D696 Thrombocytopenia, unspecified: Secondary | ICD-10-CM | POA: Diagnosis present

## 2019-03-07 DIAGNOSIS — Z20828 Contact with and (suspected) exposure to other viral communicable diseases: Secondary | ICD-10-CM | POA: Diagnosis present

## 2019-03-07 DIAGNOSIS — I2781 Cor pulmonale (chronic): Secondary | ICD-10-CM

## 2019-03-07 DIAGNOSIS — F418 Other specified anxiety disorders: Secondary | ICD-10-CM

## 2019-03-07 DIAGNOSIS — Z79899 Other long term (current) drug therapy: Secondary | ICD-10-CM | POA: Diagnosis not present

## 2019-03-07 DIAGNOSIS — J9621 Acute and chronic respiratory failure with hypoxia: Secondary | ICD-10-CM | POA: Diagnosis present

## 2019-03-07 DIAGNOSIS — J9622 Acute and chronic respiratory failure with hypercapnia: Secondary | ICD-10-CM | POA: Diagnosis present

## 2019-03-07 DIAGNOSIS — Z720 Tobacco use: Secondary | ICD-10-CM

## 2019-03-07 DIAGNOSIS — Z79890 Hormone replacement therapy (postmenopausal): Secondary | ICD-10-CM | POA: Diagnosis not present

## 2019-03-07 LAB — COMPREHENSIVE METABOLIC PANEL
ALT: 22 U/L (ref 0–44)
ALT: 17 U/L (ref 0–44)
AST: 28 U/L (ref 15–41)
AST: 24 U/L (ref 15–41)
Albumin: 4.1 g/dL (ref 3.5–5.0)
Albumin: 3.7 g/dL (ref 3.5–5.0)
Alkaline Phosphatase: 50 U/L (ref 38–126)
Alkaline Phosphatase: 46 U/L (ref 38–126)
Anion gap: 9 (ref 5–15)
Anion gap: 11 (ref 5–15)
BUN: 11 mg/dL (ref 8–23)
BUN: 10 mg/dL (ref 8–23)
CO2: 39 mmol/L — ABNORMAL HIGH (ref 22–32)
CO2: 36 mmol/L — ABNORMAL HIGH (ref 22–32)
Calcium: 9.4 mg/dL (ref 8.9–10.3)
Calcium: 9 mg/dL (ref 8.9–10.3)
Chloride: 93 mmol/L — ABNORMAL LOW (ref 98–111)
Chloride: 93 mmol/L — ABNORMAL LOW (ref 98–111)
Creatinine, Ser: 0.73 mg/dL (ref 0.44–1.00)
Creatinine, Ser: 0.6 mg/dL (ref 0.44–1.00)
GFR calc Af Amer: >60 mL/min (ref >60)
GFR calc Af Amer: >60 mL/min (ref >60)
GFR calc non Af Amer: >60 mL/min (ref >60)
GFR calc non Af Amer: >60 mL/min (ref >60)
Glucose, Bld: 107 mg/dL — ABNORMAL HIGH (ref 70–99)
Glucose, Bld: 133 mg/dL — ABNORMAL HIGH (ref 70–99)
Potassium: 4.4 mmol/L (ref 3.5–5.1)
Potassium: 4.5 mmol/L (ref 3.5–5.1)
Sodium: 141 mmol/L (ref 135–145)
Sodium: 140 mmol/L (ref 135–145)
Total Bilirubin: 0.6 mg/dL (ref 0.3–1.2)
Total Bilirubin: 0.8 mg/dL (ref 0.3–1.2)
Total Protein: 7.1 g/dL (ref 6.5–8.1)
Total Protein: 6.5 g/dL (ref 6.5–8.1)

## 2019-03-07 LAB — BLOOD GAS, ARTERIAL
Acid-Base Excess: 18.1 mmol/L — ABNORMAL HIGH (ref 0.0–2.0)
Acid-Base Excess: 14.6 mmol/L — ABNORMAL HIGH (ref 0.0–2.0)
Bicarbonate: 44.5 mmol/L — ABNORMAL HIGH (ref 20.0–28.0)
Bicarbonate: 45.5 mmol/L — ABNORMAL HIGH (ref 20.0–28.0)
Bicarbonate: 40.8 mmol/L — ABNORMAL HIGH (ref 20.0–28.0)
Delivery systems: POSITIVE
Delivery systems: POSITIVE
Drawn by: 44135
Drawn by: 28320
Drawn by: 535471
Expiratory PAP: 5
Expiratory PAP: 5
FIO2: 40
FIO2: 0.35
Inspiratory PAP: 20
Inspiratory PAP: 18
O2 Content: 3 L/min
O2 Saturation: 97.4 %
O2 Saturation: 96.1 %
O2 Saturation: 94 %
Patient temperature: 98.4
Patient temperature: 99.4
RATE: 12 resp/min
pCO2 arterial: 100 mmHg (ref 32.0–48.0)
pCO2 arterial: 77.9 mmHg (ref 32.0–48.0)
pH, Arterial: 7.17 — CL (ref 7.350–7.450)
pH, Arterial: 7.279 — ABNORMAL LOW (ref 7.350–7.450)
pH, Arterial: 7.342 — ABNORMAL LOW (ref 7.350–7.450)
pO2, Arterial: 114 mmHg — ABNORMAL HIGH (ref 83.0–108.0)
pO2, Arterial: 83 mmHg (ref 83.0–108.0)
pO2, Arterial: 70.3 mmHg — ABNORMAL LOW (ref 83.0–108.0)

## 2019-03-07 LAB — CBC WITH DIFFERENTIAL/PLATELET
Abs Immature Granulocytes: 0.02 10*3/uL (ref 0.00–0.07)
Basophils Absolute: 0 10*3/uL (ref 0.0–0.1)
Basophils Relative: 1 %
Eosinophils Absolute: 0 10*3/uL (ref 0.0–0.5)
Eosinophils Relative: 1 %
HCT: 48.7 % — ABNORMAL HIGH (ref 36.0–46.0)
Hemoglobin: 13.9 g/dL (ref 12.0–15.0)
Immature Granulocytes: 0 %
Lymphocytes Relative: 14 %
Lymphs Abs: 0.8 10*3/uL (ref 0.7–4.0)
MCH: 30.5 pg (ref 26.0–34.0)
MCHC: 28.5 g/dL — ABNORMAL LOW (ref 30.0–36.0)
MCV: 106.8 fL — ABNORMAL HIGH (ref 80.0–100.0)
Monocytes Absolute: 0.7 10*3/uL (ref 0.1–1.0)
Monocytes Relative: 12 %
Neutro Abs: 4.2 10*3/uL (ref 1.7–7.7)
Neutrophils Relative %: 72 %
Platelets: 150 10*3/uL (ref 150–400)
RBC: 4.56 MIL/uL (ref 3.87–5.11)
RDW: 15.3 % (ref 11.5–15.5)
WBC: 5.8 10*3/uL (ref 4.0–10.5)
nRBC: 0 % (ref 0.0–0.2)

## 2019-03-07 LAB — CBC
HCT: 42.8 % (ref 36.0–46.0)
Hemoglobin: 12.5 g/dL (ref 12.0–15.0)
MCH: 30.6 pg (ref 26.0–34.0)
MCHC: 29.2 g/dL — ABNORMAL LOW (ref 30.0–36.0)
MCV: 104.9 fL — ABNORMAL HIGH (ref 80.0–100.0)
Platelets: 139 10*3/uL — ABNORMAL LOW (ref 150–400)
RBC: 4.08 MIL/uL (ref 3.87–5.11)
RDW: 15.5 % (ref 11.5–15.5)
WBC: 5.8 10*3/uL (ref 4.0–10.5)
nRBC: 0 % (ref 0.0–0.2)

## 2019-03-07 LAB — I-STAT TROPONIN, ED: Troponin i, poc: 0.01 ng/mL (ref 0.00–0.08)

## 2019-03-07 LAB — URINALYSIS, COMPLETE (UACMP) WITH MICROSCOPIC
Bacteria, UA: NONE SEEN
Bilirubin Urine: NEGATIVE
Glucose, UA: NEGATIVE mg/dL
Hgb urine dipstick: NEGATIVE
Ketones, ur: NEGATIVE mg/dL
Leukocytes,Ua: NEGATIVE
Nitrite: NEGATIVE
Protein, ur: 100 mg/dL — AB
Specific Gravity, Urine: 1.028 (ref 1.005–1.030)
pH: 6 (ref 5.0–8.0)

## 2019-03-07 LAB — T4, FREE: Free T4: 0.85 ng/dL (ref 0.82–1.77)

## 2019-03-07 LAB — TSH: TSH: 0.272 u[IU]/mL — ABNORMAL LOW (ref 0.350–4.500)

## 2019-03-07 LAB — BRAIN NATRIURETIC PEPTIDE: B Natriuretic Peptide: 37.2 pg/mL (ref 0.0–100.0)

## 2019-03-07 LAB — SARS CORONAVIRUS 2 BY RT PCR (HOSPITAL ORDER, PERFORMED IN ~~LOC~~ HOSPITAL LAB): SARS Coronavirus 2: NEGATIVE

## 2019-03-07 LAB — MAGNESIUM: Magnesium: 1.7 mg/dL (ref 1.7–2.4)

## 2019-03-07 MED ORDER — FUROSEMIDE 40 MG PO TABS
40.0000 mg | ORAL_TABLET | Freq: Every day | ORAL | Status: DC
  Administered 2019-03-08 – 2019-03-10 (×3): 40 mg via ORAL
  Filled 2019-03-07 (×3): qty 1

## 2019-03-07 MED ORDER — ARFORMOTEROL TARTRATE 15 MCG/2ML IN NEBU
15.0000 ug | INHALATION_SOLUTION | Freq: Two times a day (BID) | RESPIRATORY_TRACT | Status: DC
  Administered 2019-03-07 – 2019-03-10 (×7): 15 ug via RESPIRATORY_TRACT
  Filled 2019-03-07 (×7): qty 2

## 2019-03-07 MED ORDER — CITALOPRAM HYDROBROMIDE 20 MG PO TABS
20.0000 mg | ORAL_TABLET | Freq: Every day | ORAL | Status: DC
  Administered 2019-03-08 – 2019-03-10 (×3): 20 mg via ORAL
  Filled 2019-03-07 (×3): qty 1

## 2019-03-07 MED ORDER — DM-GUAIFENESIN ER 30-600 MG PO TB12
1.0000 | ORAL_TABLET | Freq: Two times a day (BID) | ORAL | Status: DC
  Administered 2019-03-07 – 2019-03-10 (×6): 1 via ORAL
  Filled 2019-03-07 (×8): qty 1

## 2019-03-07 MED ORDER — ASPIRIN EC 81 MG PO TBEC
81.0000 mg | DELAYED_RELEASE_TABLET | Freq: Every day | ORAL | Status: DC
  Administered 2019-03-08 – 2019-03-10 (×3): 81 mg via ORAL
  Filled 2019-03-07 (×3): qty 1

## 2019-03-07 MED ORDER — ALBUTEROL SULFATE (2.5 MG/3ML) 0.083% IN NEBU
2.5000 mg | INHALATION_SOLUTION | Freq: Four times a day (QID) | RESPIRATORY_TRACT | Status: DC | PRN
  Administered 2019-03-07 (×2): 2.5 mg via RESPIRATORY_TRACT
  Filled 2019-03-07: qty 3

## 2019-03-07 MED ORDER — LEVOTHYROXINE SODIUM 75 MCG PO TABS
75.0000 ug | ORAL_TABLET | Freq: Every day | ORAL | Status: DC
  Administered 2019-03-08 – 2019-03-10 (×3): 75 ug via ORAL
  Filled 2019-03-07 (×3): qty 1

## 2019-03-07 MED ORDER — PANTOPRAZOLE SODIUM 40 MG IV SOLR
40.0000 mg | INTRAVENOUS | Status: DC
  Administered 2019-03-07 – 2019-03-08 (×2): 40 mg via INTRAVENOUS
  Filled 2019-03-07 (×2): qty 40

## 2019-03-07 MED ORDER — ENOXAPARIN SODIUM 40 MG/0.4ML ~~LOC~~ SOLN
40.0000 mg | SUBCUTANEOUS | Status: DC
  Administered 2019-03-07 – 2019-03-10 (×4): 40 mg via SUBCUTANEOUS
  Filled 2019-03-07 (×4): qty 0.4

## 2019-03-07 MED ORDER — IPRATROPIUM-ALBUTEROL 0.5-2.5 (3) MG/3ML IN SOLN
3.0000 mL | Freq: Four times a day (QID) | RESPIRATORY_TRACT | Status: DC
  Administered 2019-03-07 – 2019-03-09 (×10): 3 mL via RESPIRATORY_TRACT
  Filled 2019-03-07 (×11): qty 3

## 2019-03-07 MED ORDER — FLUTICASONE FUROATE-VILANTEROL 200-25 MCG/INH IN AEPB
1.0000 | INHALATION_SPRAY | Freq: Every day | RESPIRATORY_TRACT | Status: DC
  Administered 2019-03-08 – 2019-03-10 (×3): 1 via RESPIRATORY_TRACT
  Filled 2019-03-07: qty 28

## 2019-03-07 MED ORDER — ATORVASTATIN CALCIUM 10 MG PO TABS
10.0000 mg | ORAL_TABLET | Freq: Every day | ORAL | Status: DC
  Administered 2019-03-08 – 2019-03-10 (×3): 10 mg via ORAL
  Filled 2019-03-07 (×3): qty 1

## 2019-03-07 MED ORDER — NICOTINE 21 MG/24HR TD PT24
21.0000 mg | MEDICATED_PATCH | Freq: Every day | TRANSDERMAL | Status: DC
  Filled 2019-03-07: qty 1

## 2019-03-07 MED ORDER — METHYLPREDNISOLONE SODIUM SUCC 40 MG IJ SOLR
40.0000 mg | Freq: Two times a day (BID) | INTRAMUSCULAR | Status: DC
  Administered 2019-03-07 – 2019-03-08 (×3): 40 mg via INTRAVENOUS
  Filled 2019-03-07 (×3): qty 1

## 2019-03-07 MED ORDER — BUDESONIDE 0.25 MG/2ML IN SUSP
0.2500 mg | Freq: Two times a day (BID) | RESPIRATORY_TRACT | Status: DC
  Administered 2019-03-07 – 2019-03-10 (×7): 0.25 mg via RESPIRATORY_TRACT
  Filled 2019-03-07 (×7): qty 2

## 2019-03-07 MED ORDER — IPRATROPIUM-ALBUTEROL 0.5-2.5 (3) MG/3ML IN SOLN: 3.0000 mL | Freq: Four times a day (QID) | RESPIRATORY_TRACT | Status: DC | PRN

## 2019-03-07 NOTE — Progress Notes (Signed)
Patient transported from ED Room 31 to 0K93 with no complications.

## 2019-03-07 NOTE — Progress Notes (Signed)
Patient currently on 3L [place] with no respiratory distress, Bipap not needed at this time will continue to monitor pt.

## 2019-03-07 NOTE — Progress Notes (Signed)
PROGRESS NOTE    [name]  POE:[id number] DOB: [birth year] DOA: 03/06/2019 PCP: Biagio Borg, MD   Brief Narrative:  HPI On 03/07/2019 by Dr. Bernadette Hoit Jasmine [name] is a 73 y.o. female with medical history significant of chronic hypoxic respiratory failure, COPD on 3lpm of oxygen via [place] at home, diastolic heart failure who presents to the emergency department via EMS due to 1 day of fatigue and lethargy.  History cannot be obtained from patient due to her lethargic state, history was obtained from ED physician and ED chart.  Per record, patient was in her normal state of health until yesterday's morning (03/06/2019), she has been compliant with her BiPAP and home oxygen.  However, patient did partake in more strenuous activity on Saturday (03/05/2019) while trying to organize things at home, she had no complaints after finishing her chores and she continued to be at her normal baseline functioning state earlier in the day yesterday.  Later in the afternoon, significant other called EMS to evaluate patient due to being slightly lethargic around 5 PM and there was no indication for patient to be brought to the ED at that time.  However, patient became more lethargic afterwards and EMS was called again, she was then brought into the ED for further evaluation and management.  There was no report of fever, chills, chest pain, vomiting or diarrhea. Assessment & Plan   Patient admitted earlier today by Dr. Josephine Cables. See H&P for details.  Acute on chronic hypoxic respiratory failure with hypercarbia secondary to COPD exacerbation -Patient had recent admission similar to this in March 2020.  Patient uses 3 L of home oxygen as well as BiPAP. -SARS coronavirus 2- negative -Chest x-ray reviewed and showed no infection or fluid.  Nodular opacity on the right upper lobe unchanged from prior -ABG on admission: pH pH 7.279, PCO2 100, PO2 83, bicarb 45.5 -Continue BiPAP -Continue nebulizer treatments,  Mucinex, Pulmicort, Brovana, steroids  Respiratory acidosis -Secondary to the above -Continue treatment plan as above  Chronic diastolic heart failure/cor pulmonale -Currently appears to be compensated -Echocardiogram 12/17/2018 shows an EF of 65 to 70%, moderate LVH, normal wall motion, moderately dilated RV with normal systolic function -BNP 31.5 -Continue Lasix -Watch her intake and output, daily weights  Tobacco abuse -Continue nicotine patch -Will need smoking cessation counseling once she is more stable  Hypothyroidism -TSH 0.272, free T4 pending -Continue Synthroid  Essential hypertension -BP stable, continue Lasix  Depression -Continue Celexa  DVT Prophylaxis Lovenox  Code Status: Full  Family Communication: None at bedside.  Disposition Plan: Admitted.  Suspect disposition to home once respiratory issues have improved.  Consultants None  Procedures  None  Antibiotics   Anti-infectives (From admission, onward)   None      Subjective:   [name] seen and examined today.  Admitted earlier today. Currently sleeping and appears comfortable. No overnight issues per RN.  Objective:   Vitals:   03/07/19 0803 03/07/19 0804 03/07/19 0805 03/07/19 0814  BP:    (!) 123/52  Pulse:   75 76  Resp:   17 17  Temp:    99.4 F (37.4 C)  TempSrc:    Axillary  SpO2: 98% 97% 98% 97%  Weight:      Height:       No intake or output data in the 24 hours ending 03/07/19 0944 Filed Weights   03/06/19 2237 03/07/19 0451  Weight: 49.9 kg 54.2 kg    Exam  General: Well developed, well nourished, NAD  HEENT: NCAT, BiPAP in place  Cardiovascular: S1 S2 auscultated, RRR  Respiratory: Breath sounds, wheezing  Abdomen: Soft, nontender, nondistended, + bowel sounds   Data Reviewed: I have personally reviewed following labs and imaging studies  CBC: Recent Labs  Lab 03/06/19 2319 03/07/19 0514  WBC 5.8 5.8  NEUTROABS 4.2  --   HGB 13.9 12.5  HCT  48.7* 42.8  MCV 106.8* 104.9*  PLT 150 779*   Basic Metabolic Panel: Recent Labs  Lab 03/06/19 2319 03/07/19 0514  NA 141 140  K 4.4 4.5  CL 93* 93*  CO2 39* 36*  GLUCOSE 107* 133*  BUN 11 10  CREATININE 0.73 0.60  CALCIUM 9.4 9.0  MG  --  1.7   GFR: Estimated Creatinine Clearance: 46.6 mL/min (by C-G formula based on SCr of 0.6 mg/dL). Liver Function Tests: Recent Labs  Lab 03/06/19 2319 03/07/19 0514  AST 28 24  ALT 22 17  ALKPHOS 50 46  BILITOT 0.6 0.8  PROT 7.1 6.5  ALBUMIN 4.1 3.7   No results for input(s): LIPASE, AMYLASE in the last 168 hours. No results for input(s): AMMONIA in the last 168 hours. Coagulation Profile: No results for input(s): INR, PROTIME in the last 168 hours. Cardiac Enzymes: No results for input(s): CKTOTAL, CKMB, CKMBINDEX, TROPONINI in the last 168 hours. BNP (last 3 results) No results for input(s): PROBNP in the last 8760 hours. HbA1C: No results for input(s): HGBA1C in the last 72 hours. CBG: Recent Labs  Lab 03/06/19 2327  GLUCAP 105*   Lipid Profile: No results for input(s): CHOL, HDL, LDLCALC, TRIG, CHOLHDL, LDLDIRECT in the last 72 hours. Thyroid Function Tests: Recent Labs    03/06/19 2321  TSH 0.272*   Anemia Panel: No results for input(s): VITAMINB12, FOLATE, FERRITIN, TIBC, IRON, RETICCTPCT in the last 72 hours. Urine analysis:    Component Value Date/Time   COLORURINE YELLOW 03/07/2019 0047   APPEARANCEUR HAZY (A) 03/07/2019 0047   LABSPEC 1.028 03/07/2019 0047   PHURINE 6.0 03/07/2019 [place] 03/07/2019 0047   GLUCOSEU NEGATIVE 09/20/2018 1426   HGBUR NEGATIVE 03/07/2019 [place] 03/07/2019 0047   BILIRUBINUR negative 08/11/2011 [place] 03/07/2019 0047   PROTEINUR 100 (A) 03/07/2019 0047   UROBILINOGEN 0.2 09/20/2018 1426   NITRITE NEGATIVE 03/07/2019 0047   LEUKOCYTESUR NEGATIVE 03/07/2019 0047   Sepsis Labs:  @[email or web address] (procalcitonin:4,lacticidven:4)  ) Recent Results (from the past 240 hour(s))  SARS Coronavirus 2 (CEPHEID - Performed in [facility] hospital lab), Hosp Order     Status: None   Collection Time: 03/07/19 12:47 AM  Result Value Ref Range Status   SARS Coronavirus 2 NEGATIVE NEGATIVE Final    Comment: (NOTE) If result is NEGATIVE SARS-CoV-2 target nucleic acids are NOT DETECTED. The SARS-CoV-2 RNA is generally detectable in upper and lower  respiratory specimens during the acute phase of infection. The lowest  concentration of SARS-CoV-2 viral copies this assay can detect is 250  copies / mL. A negative result does not preclude SARS-CoV-2 infection  and should not be used as the sole basis for treatment or other  patient management decisions.  A negative result may occur with  improper specimen collection / handling, submission of specimen other  than nasopharyngeal swab, presence of viral mutation(s) within the  areas targeted by this assay, and inadequate number of viral copies  (<250 copies / mL). A negative result must be combined  with clinical  observations, patient history, and epidemiological information. If result is POSITIVE SARS-CoV-2 target nucleic acids are DETECTED. The SARS-CoV-2 RNA is generally detectable in upper and lower  respiratory specimens dur ing the acute phase of infection.  Positive  results are indicative of active infection with SARS-CoV-2.  Clinical  correlation with patient history and other diagnostic information is  necessary to determine patient infection status.  Positive results do  not rule out bacterial infection or co-infection with other viruses. If result is PRESUMPTIVE POSTIVE SARS-CoV-2 nucleic acids MAY BE PRESENT.   A presumptive positive result was obtained on the submitted specimen  and confirmed on repeat testing.  While 2019 novel coronavirus  (SARS-CoV-2) nucleic acids may be present in the submitted sample  additional  confirmatory testing may be necessary for epidemiological  and / or clinical management purposes  to differentiate between  SARS-CoV-2 and other Sarbecovirus currently known to infect humans.  If clinically indicated additional testing with an alternate test  methodology [phone]) is advised. The SARS-CoV-2 RNA is generally  detectable in upper and lower respiratory sp ecimens during the acute  phase of infection. The expected result is Negative. Fact Sheet for Patients:  StrictlyIdeas.no Fact Sheet for Healthcare Providers: BankingDealers.co.za This test is not yet approved or cleared by the [place] FDA and has been authorized for detection and/or diagnosis of SARS-CoV-2 by FDA under an Emergency Use Authorization (EUA).  This EUA will remain in effect (meaning this test can be used) for the duration of the COVID-19 declaration under Section 564(b)(1) of the Act, 21 U.S.C. section 360bbb-3(b)(1), unless the authorization is terminated or revoked sooner. Performed at [facility], [place] [address] [place], [place] [address]       Radiology Studies: Dg Chest 2 View  Result Date: 03/06/2019 CLINICAL DATA:  Lethargy EXAM: CHEST - 2 VIEW COMPARISON:  12/16/2018 FINDINGS: Cardiomegaly. Nodular opacity of the right upper lobe unchanged from prior. Emphysematous change. No new or focal airspace opacity. IMPRESSION: Cardiomegaly. Nodular opacity of the right upper lobe unchanged from prior. Emphysematous change. No new or focal airspace opacity. Electronically Signed   By: Eddie Candle M.D.   On: 03/06/2019 23:58     Scheduled Meds: . arformoterol  15 mcg Nebulization BID  . aspirin EC  81 mg Oral Daily  . atorvastatin  10 mg Oral Daily  . budesonide (PULMICORT) nebulizer solution  0.25 mg Nebulization BID  . citalopram  20 mg Oral Daily  . dextromethorphan-guaiFENesin  1 tablet Oral BID  . enoxaparin (LOVENOX) injection  40 mg  Subcutaneous Q24H  . fluticasone furoate-vilanterol  1 puff Inhalation Daily  . furosemide  40 mg Oral Daily  . ipratropium-albuterol  3 mL Nebulization Q6H  . levothyroxine  75 mcg Oral QAC breakfast  . methylPREDNISolone (SOLU-MEDROL) injection  40 mg Intravenous Q12H  . nicotine  21 mg Transdermal Daily  . pantoprazole (PROTONIX) IV  40 mg Intravenous Q24H   Continuous Infusions: . sodium chloride 125 mL/hr at 03/07/19 0042     LOS: 0 days   Time Spent in minutes   30 minutes  Vipul Cafarelli D.O. on 03/07/2019 at 9:44 AM  Between 7am to 7pm - Please see pager noted on [email or web address]  After 7pm go to [email or web address]  And look for the night coverage person covering for me after hours  [facility] Office  [phone]

## 2019-03-07 NOTE — Progress Notes (Signed)
CRITICAL VALUE  CO2-100 given to RN

## 2019-03-07 NOTE — H&P (Signed)
History and Physical    Jasmine [name] JIR:[id number] DOB: May 09, 1946 DOA: 03/06/2019  Referring MD/NP/PA: Aretha Parrot PCP: Biagio Borg, MD Patient coming from: Home  Chief Complaint: Fatigue and lethargy  HPI: Jasmine [name] is a 73 y.o. female with medical history significant of chronic hypoxic respiratory failure, COPD on 3lpm of oxygen via [place] at home, diastolic heart failure who presents to the emergency department via EMS due to 1 day of fatigue and lethargy.  History cannot be obtained from patient due to her lethargic state, history was obtained from ED physician and ED chart.  Per record, patient was in her normal state of health until yesterday's morning (03/06/2019), she has been compliant with her BiPAP and home oxygen.  However, patient did partake in more strenuous activity on Saturday (03/05/2019) while trying to organize things at home, she had no complaints after finishing her chores and she continued to be at her normal baseline functioning state earlier in the day yesterday.  Later in the afternoon, significant other called EMS to evaluate patient due to being slightly lethargic around 5 PM and there was no indication for patient to be brought to the ED at that time.  However, patient became more lethargic afterwards and EMS was called again, she was then brought into the ED for further evaluation and management.  There was no report of fever, chills, chest pain, vomiting or diarrhea.  ED Course:  In the emergency department, BP was 107/66, pulse 82bpm, RR 10/minute, temperature 98.5 and O2 sat was 100% on BiPAP.  Work-up in the ED showed normal CBC and BMP, but ABG acidosis and hypercarbia.  TSH was 0.272.  Troponin times one 0.01, SARS coronavirus 2 was negative.  Chest x-ray showed cardiomegaly with no new or focal airspace opacity. Hospitalist was called to admit patient for further evaluation and management.  Review of Systems:  This cannot be obtained at this time due  to patient's lethargic state  Past Medical History:  Diagnosis Date  . ALLERGIC RHINITIS 05/01/2007  . Anxiety   . Arthritis    "hands" (12/28/2015)  . ASTHMA 05/01/2007   "since I was a child"  . Cancer (HCC)    LUNG  . CHF (congestive heart failure) ([place])   . Chronic bronchitis ([place])   . COPD (chronic obstructive pulmonary disease) ([place])   . DEPRESSION 07/21/2008  . DVT (deep venous thrombosis) (HCC) 1960s   LLE  . FATIGUE 10/10/2010  . HYPERTENSION 05/01/2007  . HYPERTHYROIDISM 11/23/2007   Pt endorses having had Graves disease, possibly radioactive iodine x1, but no thyroidectomy  . HYPOTHYROIDISM 08/23/2009  . LUMBAR RADICULOPATHY, RIGHT 08/25/2008  . On home oxygen therapy    "3-4L; qd; all the time" (12/28/2015)  . OSTEOPOROSIS 05/01/2007  . SHOULDER PAIN, LEFT 08/23/2009  . SINUSITIS, CHRONIC 10/10/2010    Past Surgical History:  Procedure Laterality Date  . APPENDECTOMY    . DILATION AND CURETTAGE OF UTERUS  multiple   history of multiple dialations and curettages and miscarriages, unfortunately never carrying a child to term  . ECTOPIC PREGNANCY SURGERY  "late '60s or early '70s  . ELECTROCARDIOGRAM  06/20/2006  . OOPHORECTOMY Right      reports that she quit smoking about 3 years ago. Her smoking use included cigarettes. She has a 54.00 pack-year smoking history. She has never used smokeless tobacco. She reports current alcohol use of about 2.0 standard drinks of alcohol per week. She reports previous drug use.  Allergies  Allergen Reactions  . Alendronate Sodium Other (See Comments)    Patient does not remember this reaction    Family History  Problem Relation Age of Onset  . Lung cancer Father   . Alcohol abuse Brother   . Diabetes Brother   . Hypertension Other   . Stroke Mother   . Thyroid disease Mother   . Asthma Other        maternal aunts  . Breast cancer Neg Hx    Unacceptable: Noncontributory, unremarkable, or negative. Acceptable: Family history  reviewed and not pertinent (If you reviewed it)  Prior to Admission medications   Medication Sig Start Date End Date Taking? Authorizing Provider  albuterol (PROVENTIL) (2.5 MG/3ML) 0.083% nebulizer solution Take 3 mLs (2.5 mg total) by nebulization every 6 (six) hours as needed for wheezing or shortness of breath. 12/21/18   Arrien, Jimmy Picket, MD  aspirin EC 81 MG EC tablet Take 1 tablet (81 mg total) by mouth daily. 03/27/16   Velvet Bathe, MD  atorvastatin (LIPITOR) 10 MG tablet Take 1 tablet (10 mg total) by mouth daily. 09/20/18 09/20/19  Biagio Borg, MD  citalopram (CELEXA) 20 MG tablet Take 1 tablet (20 mg total) by mouth daily. 06/16/18   Biagio Borg, MD  fluticasone furoate-vilanterol (BREO ELLIPTA) 200-25 MCG/INH AEPB Inhale 1 puff into the lungs daily. 05/28/18   Robbie Lis, MD  furosemide (LASIX) 40 MG tablet 1 tab by mouth in the AM daily, with 1 tab in the PM as needed for persistent swelling 06/16/18   Biagio Borg, MD  levothyroxine (SYNTHROID, LEVOTHROID) 75 MCG tablet TAKE 1 TABLET BY MOUTH DAILY BEFORE BREAKFAST. Patient taking differently: Take 75 mcg by mouth daily before breakfast.  10/29/18   Biagio Borg, MD  OXYGEN Inhale 3 L into the lungs. 24/7    [provider]  potassium chloride (K-DUR) 10 MEQ tablet Take 1 tablet (10 mEq total) by mouth daily. 09/22/18   Biagio Borg, MD  [place]    [provider]  SPIRIVA HANDIHALER 18 MCG inhalation capsule PLACE 1 CAPSULE (18 MCG TOTAL) INTO INHALER AND INHALE DAILY. 01/03/19   Rigoberto Noel, MD  traMADol (ULTRAM) 50 MG tablet Take 1 tablet (50 mg total) by mouth 2 (two) times daily as needed. 04/30/18   Biagio Borg, MD  VENTOLIN HFA 108 (90 Base) MCG/ACT inhaler INHALE 1 TO 2 PUFFS INTO THE LUNGS EVERY 6 (SIX) HOURS AS NEEDED FOR WHEEZING OR SHORTNESS OF BREATH. 12/22/18   Rigoberto Noel, MD    Physical Exam: Vitals:   03/07/19 0400 03/07/19 0403 03/07/19 0415 03/07/19 0451  BP:  (!) 142/62  122/64 139/63  Pulse: 77 74 76 80  Resp: (!) 22 15 (!) 22 20  Temp:    98.4 F (36.9 C)  TempSrc:    Axillary  SpO2: 100% 100% 100% 98%  Weight:    54.2 kg  Height:    5\' 1"  (1.549 m)      Constitutional: NAD, calm, comfortable Vitals:   03/07/19 0400 03/07/19 0403 03/07/19 0415 03/07/19 0451  BP: (!) 142/62  122/64 139/63  Pulse: 77 74 76 80  Resp: (!) 22 15 (!) 22 20  Temp:    98.4 F (36.9 C)  TempSrc:    Axillary  SpO2: 100% 100% 100% 98%  Weight:    54.2 kg  Height:    5\' 1"  (1.549 m)   Eyes: PERRL, lids and  conjunctivae normal ENMT: Mucous membranes are moist. Posterior pharynx clear of any exudate or lesions.  Neck: normal, supple, no masses, no thyromegaly Respiratory: Decreased breath sounds bilaterally with some wheezing wheezing.  No crackles. Cardiovascular: Regular rate and rhythm, no murmurs / rubs / gallops. No extremity edema. 2+ pedal pulses. No carotid bruits.  Abdomen: no tenderness, no masses palpated. No hepatosplenomegaly. Bowel sounds positive.  Musculoskeletal: no clubbing / cyanosis. No joint deformity upper and lower extremities. Good ROM, no contractures. Normal muscle tone.  Skin: no rashes, lesions, ulcers. No induration Neurologic: Sensation intact, DTR normal.  Limited neurological exam due to patient's lethargy state Psychiatric: This cannot be evaluated at this time due to patient's current condition.   Labs on Admission: I have personally reviewed following labs and imaging studies  CBC: Recent Labs  Lab 03/06/19 2319  WBC 5.8  NEUTROABS 4.2  HGB 13.9  HCT 48.7*  MCV 106.8*  PLT 891   Basic Metabolic Panel: Recent Labs  Lab 03/06/19 2319  NA 141  K 4.4  CL 93*  CO2 39*  GLUCOSE 107*  BUN 11  CREATININE 0.73  CALCIUM 9.4   GFR: Estimated Creatinine Clearance: 46.6 mL/min (by C-G formula based on SCr of 0.73 mg/dL). Liver Function Tests: Recent Labs  Lab 03/06/19 2319  AST 28  ALT 22  ALKPHOS 50   BILITOT 0.6  PROT 7.1  ALBUMIN 4.1   No results for input(s): LIPASE, AMYLASE in the last 168 hours. No results for input(s): AMMONIA in the last 168 hours. Coagulation Profile: No results for input(s): INR, PROTIME in the last 168 hours. Cardiac Enzymes: No results for input(s): CKTOTAL, CKMB, CKMBINDEX, TROPONINI in the last 168 hours. BNP (last 3 results) No results for input(s): PROBNP in the last 8760 hours. HbA1C: No results for input(s): HGBA1C in the last 72 hours. CBG: Recent Labs  Lab 03/06/19 2327  GLUCAP 105*   Lipid Profile: No results for input(s): CHOL, HDL, LDLCALC, TRIG, CHOLHDL, LDLDIRECT in the last 72 hours. Thyroid Function Tests: Recent Labs    03/06/19 2321  TSH 0.272*   Anemia Panel: No results for input(s): VITAMINB12, FOLATE, FERRITIN, TIBC, IRON, RETICCTPCT in the last 72 hours. Urine analysis:    Component Value Date/Time   COLORURINE YELLOW 03/07/2019 0047   APPEARANCEUR HAZY (A) 03/07/2019 0047   LABSPEC 1.028 03/07/2019 0047   PHURINE 6.0 03/07/2019 0047   GLUCOSEU NEGATIVE 03/07/2019 0047   GLUCOSEU NEGATIVE 09/20/2018 1426   HGBUR NEGATIVE 03/07/2019 [place] 03/07/2019 0047   BILIRUBINUR negative 08/11/2011 [place] 03/07/2019 0047   PROTEINUR 100 (A) 03/07/2019 0047   UROBILINOGEN 0.2 09/20/2018 1426   NITRITE NEGATIVE 03/07/2019 0047   LEUKOCYTESUR NEGATIVE 03/07/2019 0047   Sepsis Labs: @LABRCNTIP (procalcitonin:4,lacticidven:4) ) Recent Results (from the past 240 hour(s))  SARS Coronavirus 2 (CEPHEID - Performed in [facility] hospital lab), Hosp Order     Status: None   Collection Time: 03/07/19 12:47 AM  Result Value Ref Range Status   SARS Coronavirus 2 NEGATIVE NEGATIVE Final    Comment: (NOTE) If result is NEGATIVE SARS-CoV-2 target nucleic acids are NOT DETECTED. The SARS-CoV-2 RNA is generally detectable in upper and lower  respiratory specimens during the acute phase of  infection. The lowest  concentration of SARS-CoV-2 viral copies this assay can detect is 250  copies / mL. A negative result does not preclude SARS-CoV-2 infection  and should not be used as the sole basis for  treatment or other  patient management decisions.  A negative result may occur with  improper specimen collection / handling, submission of specimen other  than nasopharyngeal swab, presence of viral mutation(s) within the  areas targeted by this assay, and inadequate number of viral copies  (<250 copies / mL). A negative result must be combined with clinical  observations, patient history, and epidemiological information. If result is POSITIVE SARS-CoV-2 target nucleic acids are DETECTED. The SARS-CoV-2 RNA is generally detectable in upper and lower  respiratory specimens dur ing the acute phase of infection.  Positive  results are indicative of active infection with SARS-CoV-2.  Clinical  correlation with patient history and other diagnostic information is  necessary to determine patient infection status.  Positive results do  not rule out bacterial infection or co-infection with other viruses. If result is PRESUMPTIVE POSTIVE SARS-CoV-2 nucleic acids MAY BE PRESENT.   A presumptive positive result was obtained on the submitted specimen  and confirmed on repeat testing.  While 2019 novel coronavirus  (SARS-CoV-2) nucleic acids may be present in the submitted sample  additional confirmatory testing may be necessary for epidemiological  and / or clinical management purposes  to differentiate between  SARS-CoV-2 and other Sarbecovirus currently known to infect humans.  If clinically indicated additional testing with an alternate test  methodology [phone]) is advised. The SARS-CoV-2 RNA is generally  detectable in upper and lower respiratory sp ecimens during the acute  phase of infection. The expected result is Negative. Fact Sheet for Patients:   StrictlyIdeas.no Fact Sheet for Healthcare Providers: BankingDealers.co.za This test is not yet approved or cleared by the [place] FDA and has been authorized for detection and/or diagnosis of SARS-CoV-2 by FDA under an Emergency Use Authorization (EUA).  This EUA will remain in effect (meaning this test can be used) for the duration of the COVID-19 declaration under Section 564(b)(1) of the Act, 21 U.S.C. section 360bbb-3(b)(1), unless the authorization is terminated or revoked sooner. Performed at [facility], [place] [address] [place], [place] [address]      Radiological Exams on Admission: Dg Chest 2 View  Result Date: 03/06/2019 CLINICAL DATA:  Lethargy EXAM: CHEST - 2 VIEW COMPARISON:  12/16/2018 FINDINGS: Cardiomegaly. Nodular opacity of the right upper lobe unchanged from prior. Emphysematous change. No new or focal airspace opacity. IMPRESSION: Cardiomegaly. Nodular opacity of the right upper lobe unchanged from prior. Emphysematous change. No new or focal airspace opacity. Electronically Signed   By: Eddie Candle M.D.   On: 03/06/2019 23:58    EKG: Independently reviewed shows normal sinus rhythm at 83bpm Assessment/Plan Principal Problem:   Acute respiratory failure with hypoxia and hypercarbia (HCC) Active Problems:   Depression with anxiety   Essential hypertension   Cor pulmonale (HCC)   Tobacco abuse disorder   Chronic respiratory failure with hypoxia (HCC)   Chronic diastolic congestive heart failure (HCC)   COPD with acute exacerbation (HCC)   Hypothyroidism   Respiratory acidosis    Acute on chronic hypoxic respiratory failure with hypercarbia secondary to COPD exacerbation Respiratory acidosis secondary to above ABG showed hypoxia and hypercarbia, this will be repeated Continue duo nebs, Mucinex, Solu-Medrol, Pulmicort, Brovana.  Continue home albuterol and Breo Ellipta Continue IVProtonix to prevent  steroid-induced ulcer at this time with plan to transition to oral intake once patient is able to come off BiPAP Continue incentive spirometry and flutter valve when patient is able to tolerate Continue BiPAP at this time with plan to transition  patient to supplemental oxygen to maintain O2 sat > 92%   Tobacco abuse disorder Patient will be counseled on tobacco abuse cessation once she is more stable. Continue nicotine patch  Diastolic heart failure/Cor pulmonal  Continue oral furosemide  Echocardiography done on 12/17/2018 showed LVEF 65-70%, moderate LVH, normal wall motion, mildly dilated RV with normal systolic function.  Hypothyroidism TSH was 0.272, T4 will be checked Continue Synthroid  Essential hypertension Continue Lasix  Depression  Continue Celexa     DVT prophylaxis: Lovenox Code Status: Full  Family Communication: None at bedside  Disposition Plan: Plan to discharge patient home within the next 48 to 72 hours Consults called: None Admission status: Inpatient   Bernadette Hoit MD [facility]  If 7PM-7AM, please contact night-coverage  03/07/2019, 5:18 AM

## 2019-03-07 NOTE — ED Notes (Signed)
Urine culture sent to lab with urine 

## 2019-03-07 NOTE — Progress Notes (Signed)
RT obtained ABG and reported critical results to [place], MD. RT placed patient on BIPAP due to         PCO2 >120. MD is aware. RT will continue to monitor.

## 2019-03-07 NOTE — ED Notes (Signed)
ED TO INPATIENT HANDOFF REPORT  ED Nurse Name and Phone #: [phone] Mauri [place] Name/Age/Gender Jasmine [name] 73 y.o. female Room/Bed: 031C/031C  Code Status   Code Status: Prior  Home/SNF/Other Dc Home AO x4  Triage Complete: Triage complete  Chief Complaint ams  Triage Note Pt bib [facility] from home d/t weakness.  Pt's family states she was lethargic and not acting like herself.  Pt is on chronic 3L of O2.  EMS placed pt on non rebreather w/ improvement in mental status and O2 sat. Pt's O2 79% on RA placed back on non rebreather. For EMS pt was A&O x 4.    Allergies Allergies  Allergen Reactions  . Alendronate Sodium Other (See Comments)    Patient does not remember this reaction    Level of Care/Admitting Diagnosis ED Disposition    ED Disposition Condition [place]: [place] [[id number]]  Level of Care: Progressive [102]  Covid Evaluation: N/A  Diagnosis: Acute respiratory failure with hypoxia and hypercarbia [facility]) [[id number]]  Admitting Physician: Bernadette Hoit [[id number]]  Attending Physician: Bernadette Hoit [[id number]]  Estimated length of stay: past midnight tomorrow  Certification:: I certify this patient will need inpatient services for at least 2 midnights  PT Class (Do Not Modify): Inpatient [101]  PT Acc Code (Do Not Modify): Private [1]       B Medical/Surgery History Past Medical History:  Diagnosis Date  . ALLERGIC RHINITIS 05/01/2007  . Anxiety   . Arthritis    "hands" (12/28/2015)  . ASTHMA 05/01/2007   "since I was a child"  . Cancer (HCC)    LUNG  . CHF (congestive heart failure) ([place])   . Chronic bronchitis ([place])   . COPD (chronic obstructive pulmonary disease) ([address])   . DEPRESSION 07/21/2008  . DVT (deep venous thrombosis) (HCC) 1960s   LLE  . FATIGUE 10/10/2010  . HYPERTENSION 05/01/2007  . HYPERTHYROIDISM 11/23/2007   Pt endorses having had Graves disease, possibly radioactive iodine x1, but no  thyroidectomy  . HYPOTHYROIDISM 08/23/2009  . LUMBAR RADICULOPATHY, RIGHT 08/25/2008  . On home oxygen therapy    "3-4L; qd; all the time" (12/28/2015)  . OSTEOPOROSIS 05/01/2007  . SHOULDER PAIN, LEFT 08/23/2009  . SINUSITIS, CHRONIC 10/10/2010   Past Surgical History:  Procedure Laterality Date  . APPENDECTOMY    . DILATION AND CURETTAGE OF UTERUS  multiple   history of multiple dialations and curettages and miscarriages, unfortunately never carrying a child to term  . ECTOPIC PREGNANCY SURGERY  "late '60s or early '70s  . ELECTROCARDIOGRAM  06/20/2006  . OOPHORECTOMY Right      A IV Location/Drains/Wounds Patient Lines/Drains/Airways Status   Active Line/Drains/Airways    Name:   Placement date:   Placement time:   Site:   Days:   Peripheral IV 03/07/19 Right Forearm   03/07/19    0012    Forearm   less than 1   Peripheral IV 03/07/19 Left Antecubital   03/07/19    0048    Antecubital   less than 1          Intake/Output Last 24 hours No intake or output data in the 24 hours ending 03/07/19 0343  Labs/Imaging Results for orders placed or performed during the hospital encounter of 03/06/19 (from the past 48 hour(s))  Comprehensive metabolic panel     Status: Abnormal   Collection Time: 03/06/19 11:19 PM  Result Value Ref Range   Sodium 141  135 - 145 mmol/L   Potassium 4.4 3.5 - 5.1 mmol/L   Chloride 93 (L) 98 - 111 mmol/L   CO2 39 (H) 22 - 32 mmol/L   Glucose, Bld 107 (H) 70 - 99 mg/dL   BUN 11 8 - 23 mg/dL   Creatinine, Ser 0.73 0.44 - 1.00 mg/dL   Calcium 9.4 8.9 - 10.3 mg/dL   Total Protein 7.1 6.5 - 8.1 g/dL   Albumin 4.1 3.5 - 5.0 g/dL   AST 28 15 - 41 U/L   ALT 22 0 - 44 U/L   Alkaline Phosphatase 50 38 - 126 U/L   Total Bilirubin 0.6 0.3 - 1.2 mg/dL   GFR calc non Af Amer >60 >60 mL/min   GFR calc Af Amer >60 >60 mL/min   Anion gap 9 5 - 15    Comment: Performed at [facility] [address], [place], [place] [address]  CBC WITH DIFFERENTIAL      Status: Abnormal   Collection Time: 03/06/19 11:19 PM  Result Value Ref Range   WBC 5.8 4.0 - 10.5 K/uL   RBC 4.56 3.87 - 5.11 MIL/uL   Hemoglobin 13.9 12.0 - 15.0 g/dL   HCT 48.7 (H) 36.0 - 46.0 %   MCV 106.8 (H) 80.0 - 100.0 fL   MCH 30.5 26.0 - 34.0 pg   MCHC 28.5 (L) 30.0 - 36.0 g/dL   RDW 15.3 11.5 - 15.5 %   Platelets 150 150 - 400 K/uL   nRBC 0.0 0.0 - 0.2 %   Neutrophils Relative % 72 %   Neutro Abs 4.2 1.7 - 7.7 K/uL   Lymphocytes Relative 14 %   Lymphs Abs 0.8 0.7 - 4.0 K/uL   Monocytes Relative 12 %   Monocytes Absolute 0.7 0.1 - 1.0 K/uL   Eosinophils Relative 1 %   Eosinophils Absolute 0.0 0.0 - 0.5 K/uL   Basophils Relative 1 %   Basophils Absolute 0.0 0.0 - 0.1 K/uL   Immature Granulocytes 0 %   Abs Immature Granulocytes 0.02 0.00 - 0.07 K/uL    Comment: Performed at [facility], [address] [place], [place] [address]  TSH     Status: Abnormal   Collection Time: 03/06/19 11:21 PM  Result Value Ref Range   TSH 0.272 (L) 0.350 - 4.500 uIU/mL    Comment: Performed by a 3rd Generation assay with a functional sensitivity of <=0.01 uIU/mL. Performed at [facility], [place] [address], [place], [place] [address]   Brain natriuretic peptide     Status: None   Collection Time: 03/06/19 11:22 PM  Result Value Ref Range   B Natriuretic Peptide 37.2 0.0 - 100.0 pg/mL    Comment: Performed at [facility] [address], [place], [place] [address]  CBG monitoring, ED     Status: Abnormal   Collection Time: 03/06/19 11:27 PM  Result Value Ref Range   Glucose-Capillary 105 (H) 70 - 99 mg/dL   Comment 1 Notify RN   Urinalysis, Complete w Microscopic     Status: Abnormal   Collection Time: 03/07/19 12:47 AM  Result Value Ref Range   Color, Urine YELLOW YELLOW   APPearance HAZY (A) CLEAR   Specific Gravity, Urine 1.028 1.005 - 1.030   pH 6.0 5.0 - 8.0   Glucose, UA NEGATIVE NEGATIVE mg/dL   Hgb urine dipstick NEGATIVE NEGATIVE   Bilirubin Urine  NEGATIVE NEGATIVE   Ketones, ur NEGATIVE NEGATIVE  mg/dL   Protein, ur 100 (A) NEGATIVE mg/dL   Nitrite NEGATIVE NEGATIVE   Leukocytes,Ua NEGATIVE NEGATIVE   WBC, UA 0-5 0 - 5 WBC/hpf   Bacteria, UA NONE SEEN NONE SEEN   Mucus PRESENT     Comment: Performed at [facility] [address], [place], [place] [address]  SARS Coronavirus 2 (CEPHEID - Performed in [facility] hospital lab), Hosp Order     Status: None   Collection Time: 03/07/19 12:47 AM  Result Value Ref Range   SARS Coronavirus 2 NEGATIVE NEGATIVE    Comment: (NOTE) If result is NEGATIVE SARS-CoV-2 target nucleic acids are NOT DETECTED. The SARS-CoV-2 RNA is generally detectable in upper and lower  respiratory specimens during the acute phase of infection. The lowest  concentration of SARS-CoV-2 viral copies this assay can detect is 250  copies / mL. A negative result does not preclude SARS-CoV-2 infection  and should not be used as the sole basis for treatment or other  patient management decisions.  A negative result may occur with  improper specimen collection / handling, submission of specimen other  than nasopharyngeal swab, presence of viral mutation(s) within the  areas targeted by this assay, and inadequate number of viral copies  (<250 copies / mL). A negative result must be combined with clinical  observations, patient history, and epidemiological information. If result is POSITIVE SARS-CoV-2 target nucleic acids are DETECTED. The SARS-CoV-2 RNA is generally detectable in upper and lower  respiratory specimens dur ing the acute phase of infection.  Positive  results are indicative of active infection with SARS-CoV-2.  Clinical  correlation with patient history and other diagnostic information is  necessary to determine patient infection status.  Positive results do  not rule out bacterial infection or co-infection with other viruses. If result is PRESUMPTIVE POSTIVE SARS-CoV-2 nucleic acids MAY BE  PRESENT.   A presumptive positive result was obtained on the submitted specimen  and confirmed on repeat testing.  While 2019 novel coronavirus  (SARS-CoV-2) nucleic acids may be present in the submitted sample  additional confirmatory testing may be necessary for epidemiological  and / or clinical management purposes  to differentiate between  SARS-CoV-2 and other Sarbecovirus currently known to infect humans.  If clinically indicated additional testing with an alternate test  methodology [phone]) is advised. The SARS-CoV-2 RNA is generally  detectable in upper and lower respiratory sp ecimens during the acute  phase of infection. The expected result is Negative. Fact Sheet for Patients:  StrictlyIdeas.no Fact Sheet for Healthcare Providers: BankingDealers.co.za This test is not yet approved or cleared by the [place] FDA and has been authorized for detection and/or diagnosis of SARS-CoV-2 by FDA under an Emergency Use Authorization (EUA).  This EUA will remain in effect (meaning this test can be used) for the duration of the COVID-19 declaration under Section 564(b)(1) of the Act, 21 U.S.C. section 360bbb-3(b)(1), unless the authorization is terminated or revoked sooner. Performed at [facility], [place] [address], [place], [place] [address]   I-Stat Troponin, ED (not at [facility])     Status: None   Collection Time: 03/07/19  1:06 AM  Result Value Ref Range   Troponin i, poc 0.01 0.00 - 0.08 ng/mL   Comment 3            Comment: Due to the release kinetics of cTnI, a negative result within the first hours of the onset of symptoms does not rule out myocardial infarction with  certainty. If myocardial infarction is still suspected, repeat the test at appropriate intervals.   Blood gas, arterial     Status: Abnormal   Collection Time: 03/07/19  1:53 AM  Result Value Ref Range   O2 Content 3.0 L/min   Delivery systems NASAL  CANNULA    pH, Arterial 7.17 (LL) 7.350 - 7.450    Comment: CRITICAL RESULT CALLED TO, READ BACK BY AND VERIFIED WITH: J MEEKS RRT AT 0154 BY S WOODALL RRT ,RCP ON 03/07/2019    pCO2 arterial  32.0 - 48.0 mmHg    CRITICAL RESULT CALLED TO, READ BACK BY AND VERIFIED WITH:    Comment: ABOVE REPORTABLE RANGE J MEEKS RRT AT 0154 BY S WOODALL RRT, RCP ON 03/07/2019    pO2, Arterial 114 (H) 83.0 - 108.0 mmHg   Bicarbonate 44.5 (H) 20.0 - 28.0 mmol/L   O2 Saturation 97.4 %   Collection site RIGHT RADIAL    Drawn by [phone]    Sample type ARTERIAL DRAW    Allens test (pass/fail) PASS PASS   Dg Chest 2 View  Result Date: 03/06/2019 CLINICAL DATA:  Lethargy EXAM: CHEST - 2 VIEW COMPARISON:  12/16/2018 FINDINGS: Cardiomegaly. Nodular opacity of the right upper lobe unchanged from prior. Emphysematous change. No new or focal airspace opacity. IMPRESSION: Cardiomegaly. Nodular opacity of the right upper lobe unchanged from prior. Emphysematous change. No new or focal airspace opacity. Electronically Signed   By: Eddie Candle M.D.   On: 03/06/2019 23:58    Pending Labs Unresulted Labs (From admission, onward)    Start     Ordered   03/07/19 0500  CBC  Tomorrow morning,   R     03/07/19 0330   03/07/19 0332  Comprehensive metabolic panel  Tomorrow morning,   R     03/07/19 0331   03/07/19 0331  Magnesium  Once,   R     03/07/19 0330          Vitals/Pain Today's Vitals   03/07/19 0015 03/07/19 0030 03/07/19 0043 03/07/19 0200  BP: 129/68 (!) 141/59  139/60  Pulse:  89  87  Resp: 18 20  (!) 24  Temp:   98.7 F (37.1 C)   TempSrc:   Rectal   SpO2:  99%  100%  Weight:      Height:      PainSc:        Isolation Precautions No active isolations  Medications Medications  0.9 %  sodium chloride infusion ( Intravenous New Bag/Given 03/07/19 0042)    Mobility One assistance out of bed Medium Fall risk   Focused Assessments Pt is  O2 home dependent, using long extensions at home for  the O2, getting hypoxic, pt c/o increase weakness, pt is AO x 4, no skin issue, no  Neuro deficit.   R Recommendations: See Admitting Provider Note  Report given to:   Additional Notes:

## 2019-03-07 NOTE — Progress Notes (Signed)
RT called ABG results to MD. MD states it was okay to take pt off bipap. RT put patient on 3L [place] (home reg) and she is tolerating well. Vitals are stable. RT will continue to monitor.

## 2019-03-08 DIAGNOSIS — J9611 Chronic respiratory failure with hypoxia: Secondary | ICD-10-CM

## 2019-03-08 LAB — BLOOD GAS, ARTERIAL
Acid-Base Excess: 11.1 mmol/L — ABNORMAL HIGH (ref 0.0–2.0)
Allens test (pass/fail): POSITIVE — AB
Bicarbonate: 38.3 mmol/L — ABNORMAL HIGH (ref 20.0–28.0)
Drawn by: 30136
O2 Content: 3 L/min
O2 Saturation: 95.4 %
Patient temperature: 98.1
pCO2 arterial: 89.3 mmHg (ref 32.0–48.0)
pH, Arterial: 7.254 — ABNORMAL LOW (ref 7.350–7.450)
pO2, Arterial: 85.6 mmHg (ref 83.0–108.0)

## 2019-03-08 LAB — BASIC METABOLIC PANEL
Anion gap: 8 (ref 5–15)
BUN: 11 mg/dL (ref 8–23)
CO2: 34 mmol/L — ABNORMAL HIGH (ref 22–32)
Calcium: 8.7 mg/dL — ABNORMAL LOW (ref 8.9–10.3)
Chloride: 97 mmol/L — ABNORMAL LOW (ref 98–111)
Creatinine, Ser: 0.56 mg/dL (ref 0.44–1.00)
GFR calc Af Amer: >60 mL/min (ref >60)
GFR calc non Af Amer: >60 mL/min (ref >60)
Glucose, Bld: 154 mg/dL — ABNORMAL HIGH (ref 70–99)
Potassium: 5 mmol/L (ref 3.5–5.1)
Sodium: 139 mmol/L (ref 135–145)

## 2019-03-08 LAB — CBC
HCT: 37.1 % (ref 36.0–46.0)
Hemoglobin: 10.9 g/dL — ABNORMAL LOW (ref 12.0–15.0)
MCH: 30.3 pg (ref 26.0–34.0)
MCHC: 29.4 g/dL — ABNORMAL LOW (ref 30.0–36.0)
MCV: 103.1 fL — ABNORMAL HIGH (ref 80.0–100.0)
Platelets: 130 10*3/uL — ABNORMAL LOW (ref 150–400)
RBC: 3.6 MIL/uL — ABNORMAL LOW (ref 3.87–5.11)
RDW: 15.6 % — ABNORMAL HIGH (ref 11.5–15.5)
WBC: 7.8 10*3/uL (ref 4.0–10.5)
nRBC: 0 % (ref 0.0–0.2)

## 2019-03-08 LAB — RAPID URINE DRUG SCREEN, HOSP PERFORMED
Amphetamines: NOT DETECTED
Barbiturates: NOT DETECTED
Benzodiazepines: NOT DETECTED
Cocaine: NOT DETECTED
Opiates: NOT DETECTED
Tetrahydrocannabinol: NOT DETECTED

## 2019-03-08 MED ORDER — PANTOPRAZOLE SODIUM 40 MG PO TBEC
40.0000 mg | DELAYED_RELEASE_TABLET | Freq: Every day | ORAL | Status: DC
  Administered 2019-03-09 – 2019-03-10 (×2): 40 mg via ORAL
  Filled 2019-03-08 (×2): qty 1

## 2019-03-08 MED ORDER — METHYLPREDNISOLONE SODIUM SUCC 40 MG IJ SOLR
40.0000 mg | Freq: Every day | INTRAMUSCULAR | Status: DC
  Administered 2019-03-09 – 2019-03-10 (×2): 40 mg via INTRAVENOUS
  Filled 2019-03-08 (×2): qty 1

## 2019-03-08 NOTE — Progress Notes (Signed)
Critical ABG results called to Dr Ree Kida.

## 2019-03-08 NOTE — Progress Notes (Signed)
PROGRESS NOTE    Jasmine [name]  SPQ:[id number] DOB: 1945/12/06 DOA: 03/06/2019 PCP: Biagio Borg, MD   Brief Narrative:  HPI On 03/07/2019 by Dr. Bernadette Hoit Jasmine [name] is a 73 y.o. female with medical history significant of chronic hypoxic respiratory failure, COPD on 3lpm of oxygen via [place] at home, diastolic heart failure who presents to the emergency department via EMS due to 1 day of fatigue and lethargy.  History cannot be obtained from patient due to her lethargic state, history was obtained from ED physician and ED chart.  Per record, patient was in her normal state of health until yesterday's morning (03/06/2019), she has been compliant with her BiPAP and home oxygen.  However, patient did partake in more strenuous activity on Saturday (03/05/2019) while trying to organize things at home, she had no complaints after finishing her chores and she continued to be at her normal baseline functioning state earlier in the day yesterday.  Later in the afternoon, significant other called EMS to evaluate patient due to being slightly lethargic around 5 PM and there was no indication for patient to be brought to the ED at that time.  However, patient became more lethargic afterwards and EMS was called again, she was then brought into the ED for further evaluation and management.  There was no report of fever, chills, chest pain, vomiting or diarrhea.  Interim history Patient admitted for acute on chronic hypoxic/hypercapnic respiratory failure with COPD exacerbation.  Placed on nebs, Brovana, steroids, Pulmicort.  Does appear to be improving slowly. Assessment & Plan   Acute on chronic hypoxic respiratory failure with hypercarbia secondary to COPD exacerbation -Patient had recent admission similar to this in March 2020.  Patient uses 3 L of home oxygen as well as BiPAP. -SARS coronavirus 2- negative -Chest x-ray reviewed and showed no infection or fluid.  Nodular opacity on the right upper lobe  unchanged from prior -ABG on admission: pH pH 7.279, PCO2 100, PO2 83, bicarb 45.5 -PCO2 has improved, but continues to be high (upon review of chart appears to be consistently over 70) -Continue BiPAP PRN -Currently weaned to 3L nasal [place]ula -Continue nebulizer treatments, Mucinex, Pulmicort, Brovana, steroids- will being to taper steroids -Reviewed chart, patient is already on Trilogy at home, and she is being followed by Palliative care at home -she will need to follow up with pulmonology, Dr. Elsworth Soho, upon discharge  Respiratory acidosis -Secondary to the above -Continue treatment plan as above  Chronic diastolic heart failure/cor pulmonale -Currently appears to be compensated -Echocardiogram 12/17/2018 shows an EF of 65 to 70%, moderate LVH, normal wall motion, moderately dilated RV with normal systolic function -BNP 33.3 -Continue Lasix -Watch her intake and output, daily weights  Tobacco abuse -Continue nicotine patch -patient states she is no longer smoking but is exposed to second hand smoke  Hypothyroidism -TSH 0.272, free T4 0.85 -Continue Synthroid  Essential hypertension -BP stable, continue Lasix  Depression -Continue Celexa  DVT Prophylaxis Lovenox  Code Status: Full  Family Communication: None at bedside.  Disposition Plan: Admitted.  Suspect disposition to home once respiratory issues have improved.  Consultants None  Procedures  None  Antibiotics   Anti-infectives (From admission, onward)   None      Subjective:   Jasmine [name] seen and examined today.  States she is feeling better today.  Feels her breathing has improved but not back to her baseline.  Continues to feel short of breath.  Denies current chest pain, abdominal  pain, nausea or vomiting, diarrhea constipation, dizziness or headache.  Objective:   Vitals:   03/07/19 2353 03/08/19 0400 03/08/19 0731 03/08/19 0834  BP: 117/71 (!) 110/57  (!) 121/31  Pulse: 81 73  74  Resp: 20 16   19   Temp: (!) 97.3 F (36.3 C) 98.1 F (36.7 C)  (!) 97.1 F (36.2 C)  TempSrc: Axillary Oral  Oral  SpO2: 100% 100% 97% 98%  Weight:      Height:        Intake/Output Summary (Last 24 hours) at 03/08/2019 0836 Last data filed at 03/08/2019 0200 Gross per 24 hour  Intake 527.64 ml  Output --  Net 527.64 ml   Filed Weights   03/06/19 2237 03/07/19 0451  Weight: 49.9 kg 54.2 kg   Exam  General: Well developed, well nourished, NAD, appears stated age  72: NCAT, mucous membranes moist.   Cardiovascular: S1 S2 auscultated, RRR  Respiratory: Diminished breath sounds, exp wheezing   Abdomen: Soft, nontender, nondistended, + bowel sounds  Extremities: warm dry without cyanosis clubbing or edema  Neuro: AAOx3, nonfocal  Psych: Normal affect and demeanor   Data Reviewed: I have personally reviewed following labs and imaging studies  CBC: Recent Labs  Lab 03/06/19 2319 03/07/19 0514 03/08/19 0325  WBC 5.8 5.8 7.8  NEUTROABS 4.2  --   --   HGB 13.9 12.5 10.9*  HCT 48.7* 42.8 37.1  MCV 106.8* 104.9* 103.1*  PLT 150 139* 672*   Basic Metabolic Panel: Recent Labs  Lab 03/06/19 2319 03/07/19 0514 03/08/19 0325  NA 141 140 139  K 4.4 4.5 5.0  CL 93* 93* 97*  CO2 39* 36* 34*  GLUCOSE 107* 133* 154*  BUN 11 10 11   CREATININE 0.73 0.60 0.56  CALCIUM 9.4 9.0 8.7*  MG  --  1.7  --    GFR: Estimated Creatinine Clearance: 46.6 mL/min (by C-G formula based on SCr of 0.56 mg/dL). Liver Function Tests: Recent Labs  Lab 03/06/19 2319 03/07/19 0514  AST 28 24  ALT 22 17  ALKPHOS 50 46  BILITOT 0.6 0.8  PROT 7.1 6.5  ALBUMIN 4.1 3.7   No results for input(s): LIPASE, AMYLASE in the last 168 hours. No results for input(s): AMMONIA in the last 168 hours. Coagulation Profile: No results for input(s): INR, PROTIME in the last 168 hours. Cardiac Enzymes: No results for input(s): CKTOTAL, CKMB, CKMBINDEX, TROPONINI in the last 168 hours. BNP (last 3  results) No results for input(s): PROBNP in the last 8760 hours. HbA1C: No results for input(s): HGBA1C in the last 72 hours. CBG: Recent Labs  Lab 03/06/19 2327  GLUCAP 105*   Lipid Profile: No results for input(s): CHOL, HDL, LDLCALC, TRIG, CHOLHDL, LDLDIRECT in the last 72 hours. Thyroid Function Tests: Recent Labs    03/06/19 2321  TSH 0.272*  FREET4 0.85   Anemia Panel: No results for input(s): VITAMINB12, FOLATE, FERRITIN, TIBC, IRON, RETICCTPCT in the last 72 hours. Urine analysis:    Component Value Date/Time   COLORURINE YELLOW 03/07/2019 0047   APPEARANCEUR HAZY (A) 03/07/2019 0047   LABSPEC 1.028 03/07/2019 0047   PHURINE 6.0 03/07/2019 0047   GLUCOSEU NEGATIVE 03/07/2019 0047   GLUCOSEU NEGATIVE 09/20/2018 1426   HGBUR NEGATIVE 03/07/2019 [place] 03/07/2019 0047   BILIRUBINUR negative 08/11/2011 [place] 03/07/2019 0047   PROTEINUR 100 (A) 03/07/2019 0047   UROBILINOGEN 0.2 09/20/2018 1426   NITRITE NEGATIVE 03/07/2019 0047  LEUKOCYTESUR NEGATIVE 03/07/2019 0047   Sepsis Labs: @LABRCNTIP (procalcitonin:4,lacticidven:4)  ) Recent Results (from the past 240 hour(s))  SARS Coronavirus 2 (CEPHEID - Performed in [place] hospital lab), Hosp Order     Status: None   Collection Time: 03/07/19 12:47 AM  Result Value Ref Range Status   SARS Coronavirus 2 NEGATIVE NEGATIVE Final    Comment: (NOTE) If result is NEGATIVE SARS-CoV-2 target nucleic acids are NOT DETECTED. The SARS-CoV-2 RNA is generally detectable in upper and lower  respiratory specimens during the acute phase of infection. The lowest  concentration of SARS-CoV-2 viral copies this assay can detect is 250  copies / mL. A negative result does not preclude SARS-CoV-2 infection  and should not be used as the sole basis for treatment or other  patient management decisions.  A negative result may occur with  improper specimen collection / handling, submission  of specimen other  than nasopharyngeal swab, presence of viral mutation(s) within the  areas targeted by this assay, and inadequate number of viral copies  (<250 copies / mL). A negative result must be combined with clinical  observations, patient history, and epidemiological information. If result is POSITIVE SARS-CoV-2 target nucleic acids are DETECTED. The SARS-CoV-2 RNA is generally detectable in upper and lower  respiratory specimens dur ing the acute phase of infection.  Positive  results are indicative of active infection with SARS-CoV-2.  Clinical  correlation with patient history and other diagnostic information is  necessary to determine patient infection status.  Positive results do  not rule out bacterial infection or co-infection with other viruses. If result is PRESUMPTIVE POSTIVE SARS-CoV-2 nucleic acids MAY BE PRESENT.   A presumptive positive result was obtained on the submitted specimen  and confirmed on repeat testing.  While 2019 novel coronavirus  (SARS-CoV-2) nucleic acids may be present in the submitted sample  additional confirmatory testing may be necessary for epidemiological  and / or clinical management purposes  to differentiate between  SARS-CoV-2 and other Sarbecovirus currently known to infect humans.  If clinically indicated additional testing with an alternate test  methodology [phone] is advised. The SARS-CoV-2 RNA is generally  detectable in upper and lower respiratory sp ecimens during the acute  phase of infection. The expected result is Negative. Fact Sheet for Patients:  StrictlyIdeas.no Fact Sheet for Healthcare Providers: BankingDealers.co.za This test is not yet approved or cleared by the [place] FDA and has been authorized for detection and/or diagnosis of SARS-CoV-2 by FDA under an Emergency Use Authorization (EUA).  This EUA will remain in effect (meaning this test can be used) for  the duration of the COVID-19 declaration under Section 564(b)(1) of the Act, 21 U.S.C. section 360bbb-3(b)(1), unless the authorization is terminated or revoked sooner. Performed at [facility], [place] [address] [place], [place] [address]       Radiology Studies: Dg Chest 2 View  Result Date: 03/06/2019 CLINICAL DATA:  Lethargy EXAM: CHEST - 2 VIEW COMPARISON:  12/16/2018 FINDINGS: Cardiomegaly. Nodular opacity of the right upper lobe unchanged from prior. Emphysematous change. No new or focal airspace opacity. IMPRESSION: Cardiomegaly. Nodular opacity of the right upper lobe unchanged from prior. Emphysematous change. No new or focal airspace opacity. Electronically Signed   By: Eddie Candle M.D.   On: 03/06/2019 23:58     Scheduled Meds:  arformoterol  15 mcg Nebulization BID   aspirin EC  81 mg Oral Daily   atorvastatin  10 mg Oral Daily   budesonide (PULMICORT)  nebulizer solution  0.25 mg Nebulization BID   citalopram  20 mg Oral Daily   dextromethorphan-guaiFENesin  1 tablet Oral BID   enoxaparin (LOVENOX) injection  40 mg Subcutaneous Q24H   fluticasone furoate-vilanterol  1 puff Inhalation Daily   furosemide  40 mg Oral Daily   ipratropium-albuterol  3 mL Nebulization Q6H   levothyroxine  75 mcg Oral QAC breakfast   methylPREDNISolone (SOLU-MEDROL) injection  40 mg Intravenous Q12H   nicotine  21 mg Transdermal Daily   pantoprazole (PROTONIX) IV  40 mg Intravenous Q24H   Continuous Infusions:  sodium chloride 125 mL/hr at 03/08/19 0508     LOS: 1 day   Time Spent in minutes   30 minutes  Aviendha Azbell D.O. on 03/08/2019 at 8:36 AM  Between 7am to 7pm - Please see pager noted on [email or web address]  After 7pm go to [email or web address]  And look for the night coverage person covering for me after hours  [facility] Office  [phone]

## 2019-03-09 LAB — BASIC METABOLIC PANEL
Anion gap: 6 (ref 5–15)
BUN: 13 mg/dL (ref 8–23)
CO2: 36 mmol/L — ABNORMAL HIGH (ref 22–32)
Calcium: 8.6 mg/dL — ABNORMAL LOW (ref 8.9–10.3)
Chloride: 100 mmol/L (ref 98–111)
Creatinine, Ser: 0.64 mg/dL (ref 0.44–1.00)
GFR calc Af Amer: >60 mL/min (ref >60)
GFR calc non Af Amer: >60 mL/min (ref >60)
Glucose, Bld: 108 mg/dL — ABNORMAL HIGH (ref 70–99)
Potassium: 3.7 mmol/L (ref 3.5–5.1)
Sodium: 142 mmol/L (ref 135–145)

## 2019-03-09 LAB — CBC
HCT: 37.6 % (ref 36.0–46.0)
Hemoglobin: 11.1 g/dL — ABNORMAL LOW (ref 12.0–15.0)
MCH: 30.1 pg (ref 26.0–34.0)
MCHC: 29.5 g/dL — ABNORMAL LOW (ref 30.0–36.0)
MCV: 101.9 fL — ABNORMAL HIGH (ref 80.0–100.0)
Platelets: 140 10*3/uL — ABNORMAL LOW (ref 150–400)
RBC: 3.69 MIL/uL — ABNORMAL LOW (ref 3.87–5.11)
RDW: 15.9 % — ABNORMAL HIGH (ref 11.5–15.5)
WBC: 7.7 10*3/uL (ref 4.0–10.5)
nRBC: 0 % (ref 0.0–0.2)

## 2019-03-09 MED ORDER — IPRATROPIUM-ALBUTEROL 0.5-2.5 (3) MG/3ML IN SOLN
3.0000 mL | Freq: Three times a day (TID) | RESPIRATORY_TRACT | Status: DC
  Administered 2019-03-10: 08:00:00 3 mL via RESPIRATORY_TRACT
  Filled 2019-03-09: qty 3

## 2019-03-09 NOTE — Consult Note (Signed)
   [facility] Inpatient Consult   03/09/2019  HOLLYE [name] May 18, 1946 [id number]  Patient is currently active with [facility] for chronic disease management services.  Patient has been engaged by a [place] Management Coordinator and recently had been referred to [facility] for ongoing COPD management needs.  Patient with [facility]. Chart review for history and assessment from MD notes are as follows: Jasmine Josephs Booneis a 73 y.o.femalewith medical history significant ofchronic hypoxic respiratory failure, COPD on 3lpmof oxygen via [place] and trilogy at home, diastolic heart failure who presented to the emergency department via EMS due to 1 day of fatigue and lethargy. Patient was admitted to hospital for acute chronic respiratory failure secondary to COPD exacerbation and hypercarbia.   Our community based plan of care has focused on disease management and community resource support.  Patient will receive a post hospital call and will be evaluated for assessments and disease process education.   Spoke with patient via hospital telephone.  Patient states she is very concern for her Trilogy that has been sent to [place] because "something is causing me to retain carbon dioxide and the machine was sent to [place] [[facility]] but the young man called me and said he has brought it back to the hospital.  I don't know if it's been check out or anything. I am concerned if it's not functioning right or if I need to get a solution to clean it. I am just not sure what to do."    Will follow up with Inpatient [facility] team member to make aware that [place] Management following and patient concerns for Trilogy.  Of note, [facility] Care Management services does not replace or interfere with any services that are needed or arranged by inpatient case management or social work.  For additional questions or referrals please contact:  Natividad Brood, RN BSN [facility] Liaison  [phone] business mobile phone Toll free office [phone]  Fax number: [phone] [email or web address]

## 2019-03-09 NOTE — Progress Notes (Signed)
PROGRESS NOTE  Jasmine [name] NID:[id number] DOB: [birth year] DOA: 03/06/2019 PCP: Biagio Borg, MD   LOS: 2 days   Brief narrative: Jasmine Truluck Booneis a 73 y.o.femalewith medical history significant ofchronic hypoxic respiratory failure, COPD on 3lpmof oxygen via [place] and trilogy at home, diastolic heart failure who presented to the emergency department via EMS due to 1 day of fatigue and lethargy. Patient was admitted to hospital for acute chronic respiratory failure secondary to COPD exacerbation and hypercarbia.  Subjective: Feels better.  Denies any chest pain palpitation fever chills.  Denies any cough or sputum production.  Assessment/Plan:  Principal Problem:   Acute respiratory failure with hypoxia and hypercarbia (HCC) Active Problems:   Depression with anxiety   Essential hypertension   Cor pulmonale (HCC)   Tobacco abuse disorder   Chronic respiratory failure with hypoxia (HCC)   Chronic diastolic congestive heart failure (HCC)   COPD with acute exacerbation (HCC)   Hypothyroidism   Respiratory acidosis  Acute on chronic hypoxic respiratory failure with hypercarbia secondary to COPD exacerbation.  At baseline, patient uses 3 L of oxygen at home as well as trilogy.  States that she has been having problems with the mask at home and trilogy settings.  I spoke with the nursing staff and will inform case management/home health services to address this. -SARS coronavirus 2- negative. Chest x-ray showed nodular opacity on the right upper lobe unchanged from prior.  Patient does have a chronic hypercapnia.  PCO2 has improved and will need to be compliant with nocturnal BiPAP.  Will need to address her trilogy settings and mask prior to discharge. Seen is being followed by Palliative care at home Patient will need to follow up with pulmonology, Dr. Elsworth Soho, upon discharge.  On IV Solu-Medrol.  Chronic diastolic heart failure/cor pulmonale -Currently compensated. 2 D  Echocardiogram 12/17/2018 shows an EF of 65 to 70%.  Continue Lasix.  Monitor intake and output charting.  Daily weights.  Continue aspirin Lipitor.  Tobacco abuse -Continue nicotine patch -patient states she is no longer smoking but is exposed to second hand smoke   Hypothyroidism -TSH 0.272, free T4 0.85. Continue Synthroid  Essential hypertension Continue Lasix.  Blood pressure seems to be stable at this time.  Depression -Continue Celexa  Mild thrombocytopenia.  No evidence of bleeding.  Will closely monitor.  DVT Prophylaxis Lovenox  Code Status: Full  Family Communication: None at bedside.  Disposition Plan:  Home likely tomorrow.  Consultants None  Procedures  None   Antibiotics: Anti-infectives (From admission, onward)   None      Objective: Vitals:   03/09/19 1100 03/09/19 1126  BP:  128/78  Pulse: 94 (!) 103  Resp: 14 16  Temp:  98.5 F (36.9 C)  SpO2: 97% 91%    Intake/Output Summary (Last 24 hours) at 03/09/2019 1421 Last data filed at 03/09/2019 0800 Gross per 24 hour  Intake 3107.55 ml  Output -  Net 3107.55 ml   Filed Weights   03/06/19 2237 03/07/19 0451  Weight: 49.9 kg 54.2 kg   Body mass index is 22.58 kg/m.   Physical Exam: GENERAL: Patient is alert awake and oriented. Not in obvious distress.  On Nasal cannula oxygen HENT: No scleral pallor or icterus. Pupils equally reactive to light. Oral mucosa is moist NECK: is supple, no palpable thyroid enlargement. CHEST:  Diminished breath sounds bilaterally. CVS: S1 and S2 heard, no murmur. Regular rate and rhythm. No pericardial rub. ABDOMEN: Soft, non-tender, bowel sounds are  present. No palpable hepato-splenomegaly. EXTREMITIES: No edema. CNS: Cranial nerves are intact. No focal motor or sensory deficits. SKIN: warm and dry without rashes.  Data Review: I have personally reviewed the following laboratory data and studies,  CBC: Recent Labs  Lab 03/06/19 2319  03/07/19 0514 03/08/19 0325 03/09/19 0343  WBC 5.8 5.8 7.8 7.7  NEUTROABS 4.2  --   --   --   HGB 13.9 12.5 10.9* 11.1*  HCT 48.7* 42.8 37.1 37.6  MCV 106.8* 104.9* 103.1* 101.9*  PLT 150 139* 130* 127*   Basic Metabolic Panel: Recent Labs  Lab 03/06/19 2319 03/07/19 0514 03/08/19 0325 03/09/19 0343  NA 141 140 139 142  K 4.4 4.5 5.0 3.7  CL 93* 93* 97* 100  CO2 39* 36* 34* 36*  GLUCOSE 107* 133* 154* 108*  BUN 11 10 11 13   CREATININE 0.73 0.60 0.56 0.64  CALCIUM 9.4 9.0 8.7* 8.6*  MG  --  1.7  --   --    Liver Function Tests: Recent Labs  Lab 03/06/19 2319 03/07/19 0514  AST 28 24  ALT 22 17  ALKPHOS 50 46  BILITOT 0.6 0.8  PROT 7.1 6.5  ALBUMIN 4.1 3.7   No results for input(s): LIPASE, AMYLASE in the last 168 hours. No results for input(s): AMMONIA in the last 168 hours. Cardiac Enzymes: No results for input(s): CKTOTAL, CKMB, CKMBINDEX, TROPONINI in the last 168 hours. BNP (last 3 results) Recent Labs    05/24/18 0840 12/16/18 1120 03/06/19 2322  BNP 17.9 72.1 37.2    ProBNP (last 3 results) No results for input(s): PROBNP in the last 8760 hours.  CBG: Recent Labs  Lab 03/06/19 2327  GLUCAP 105*   Recent Results (from the past 240 hour(s))  SARS Coronavirus 2 (CEPHEID - Performed in [place] hospital lab), Hosp Order     Status: None   Collection Time: 03/07/19 12:47 AM  Result Value Ref Range Status   SARS Coronavirus 2 NEGATIVE NEGATIVE Final    Comment: (NOTE) If result is NEGATIVE SARS-CoV-2 target nucleic acids are NOT DETECTED. The SARS-CoV-2 RNA is generally detectable in upper and lower  respiratory specimens during the acute phase of infection. The lowest  concentration of SARS-CoV-2 viral copies this assay can detect is 250  copies / mL. A negative result does not preclude SARS-CoV-2 infection  and should not be used as the sole basis for treatment or other  patient management decisions.  A negative result may occur with   improper specimen collection / handling, submission of specimen other  than nasopharyngeal swab, presence of viral mutation(s) within the  areas targeted by this assay, and inadequate number of viral copies  (<250 copies / mL). A negative result must be combined with clinical  observations, patient history, and epidemiological information. If result is POSITIVE SARS-CoV-2 target nucleic acids are DETECTED. The SARS-CoV-2 RNA is generally detectable in upper and lower  respiratory specimens dur ing the acute phase of infection.  Positive  results are indicative of active infection with SARS-CoV-2.  Clinical  correlation with patient history and other diagnostic information is  necessary to determine patient infection status.  Positive results do  not rule out bacterial infection or co-infection with other viruses. If result is PRESUMPTIVE POSTIVE SARS-CoV-2 nucleic acids MAY BE PRESENT.   A presumptive positive result was obtained on the submitted specimen  and confirmed on repeat testing.  While 2019 novel coronavirus  (SARS-CoV-2) nucleic acids may be present in  the submitted sample  additional confirmatory testing may be necessary for epidemiological  and / or clinical management purposes  to differentiate between  SARS-CoV-2 and other Sarbecovirus currently known to infect humans.  If clinically indicated additional testing with an alternate test  methodology [phone]) is advised. The SARS-CoV-2 RNA is generally  detectable in upper and lower respiratory sp ecimens during the acute  phase of infection. The expected result is Negative. Fact Sheet for Patients:  StrictlyIdeas.no Fact Sheet for Healthcare Providers: BankingDealers.co.za This test is not yet approved or cleared by the [place] FDA and has been authorized for detection and/or diagnosis of SARS-CoV-2 by FDA under an Emergency Use Authorization (EUA).  This EUA will  remain in effect (meaning this test can be used) for the duration of the COVID-19 declaration under Section 564(b)(1) of the Act, 21 U.S.C. section 360bbb-3(b)(1), unless the authorization is terminated or revoked sooner. Performed at [facility], [place] [address], [place], [place] [address]      Studies: No results found.  Scheduled Meds: . arformoterol  15 mcg Nebulization BID  . aspirin EC  81 mg Oral Daily  . atorvastatin  10 mg Oral Daily  . budesonide (PULMICORT) nebulizer solution  0.25 mg Nebulization BID  . citalopram  20 mg Oral Daily  . dextromethorphan-guaiFENesin  1 tablet Oral BID  . enoxaparin (LOVENOX) injection  40 mg Subcutaneous Q24H  . fluticasone furoate-vilanterol  1 puff Inhalation Daily  . furosemide  40 mg Oral Daily  . ipratropium-albuterol  3 mL Nebulization Q6H  . levothyroxine  75 mcg Oral QAC breakfast  . methylPREDNISolone (SOLU-MEDROL) injection  40 mg Intravenous Daily  . nicotine  21 mg Transdermal Daily  . pantoprazole  40 mg Oral Daily    Continuous Infusions: . sodium chloride 125 mL/hr at 03/09/19 0600     Flora Lipps, MD  [facility] 03/09/2019

## 2019-03-09 NOTE — Progress Notes (Signed)
Pt placed on home Bipap machine.  2L of o2 bleed. Pt tolerating well

## 2019-03-10 LAB — GLUCOSE, CAPILLARY: Glucose-Capillary: 120 mg/dL — ABNORMAL HIGH (ref 70–99)

## 2019-03-10 MED ORDER — NICOTINE 21 MG/24HR TD PT24: 21.0000 mg | MEDICATED_PATCH | Freq: Every day | TRANSDERMAL | 0 refills | Status: DC

## 2019-03-10 MED ORDER — PREDNISONE 10 MG (21) PO TBPK: ORAL_TABLET | ORAL | 0 refills | Status: DC

## 2019-03-10 NOTE — Discharge Summary (Signed)
Physician Discharge Summary  Jasmine [name] AYT:[id number] DOB: 26-Sep-1946 DOA: 03/06/2019  PCP: Biagio Borg, MD  Admit date: 03/06/2019 Discharge date: 03/10/2019  Admitted From: Home  Discharge disposition: Home    Recommendations for Outpatient Follow-Up:   Follow up with your primary care provider and pulmonary physician as outpatient.   Discharge Diagnosis:   Principal Problem:   Acute respiratory failure with hypoxia and hypercarbia (HCC) Active Problems:   Depression with anxiety   Essential hypertension   Cor pulmonale (HCC)   Tobacco abuse disorder   Chronic respiratory failure with hypoxia (HCC)   Chronic diastolic congestive heart failure (HCC)   COPD with acute exacerbation (HCC)   Hypothyroidism   Respiratory acidosis   Discharge Condition: Improved.  Diet recommendation: Low sodium, heart healthy.    Wound care: None.  Code status: Full.  History of Present Illness:   Jasmine Ines Booneis a 73 y.o.femalewith medical history significant ofchronic hypoxic respiratory failure, COPD on 3lpmof oxygen via [place] and trilogy at home, diastolic heart failure who presented to the emergency department via EMS due to 1 day of fatigue and lethargy. Patient was admitted to hospital for acute chronic respiratory failure secondary to COPD exacerbation and hypercarbia.  Hospital Course:  Patient was admitted to the hospital and following conditions were addressed during hospitalization,  Acute on chronic hypoxic respiratory failure with hypercarbia secondary to COPD exacerbation.  At baseline, patient uses 3 L of oxygen at home as well as trilogy.  States that she has been having problems with the mask at home and trilogy settings.  Respiratory therapist adjusted the settings and the patient tolerated her home mask and trilogy well. -SARS coronavirus 2- negative. Chest x-ray showed nodular opacity on the right upper lobe unchanged from prior.  Patient does have a  chronic hypercapnia.  PCO2 has improved and will need to be compliant with nocturnal BiPAP.  Seen is being followed by Palliative care at home Patient will need to follow up with pulmonology, Dr. Elsworth Soho, upon discharge.   Will change to p.o. prednisone on discharge to complete the taper.   Chronic diastolic heart failure/cor pulmonale -Currently compensated. 2 D Echocardiogram 12/17/2018 shows an EF of 65 to 70%.  Continue Lasix on discharge.  Tobacco abuse Patient was given nicotine patch while in the hospital. Patient states she is no longer smoking but is exposed to second hand smoke   Hypothyroidism -TSH 0.272, free T40.85. Continue Synthroid on discharge  Essential hypertension Continue Lasix.    Depression -Continue Celexa  Mild thrombocytopenia.  No evidence of bleeding.  Will closely monitor.  Platelet of 140 prior to discharge.  Will need outpatient follow-up with PCP.  Disposition.  At this time, patient is stable for disposition home.  Patient was advised to continue using her trilogy and follow-up with her primary care physician and pulmonary physician as outpatient.  Medical Consultants:    None.   Subjective:   Today, patient feels better today.  Was able to sleep okay with her home trilogy.  Denies any wheezing, cough, fever or chills.  Discharge Exam:   Vitals:   03/10/19 0602 03/10/19 0810  BP: 113/67   Pulse: 75   Resp: 18   Temp: 97.9 F (36.6 C)   SpO2: 93% 93%   Vitals:   03/09/19 2345 03/10/19 0000 03/10/19 0602 03/10/19 0810  BP: 114/66 106/70 113/67   Pulse: 87 81 75   Resp: 17 19 18    Temp: 97.8 F (36.6 C)  97.9 F (36.6 C)   TempSrc: Axillary  Axillary   SpO2: 92% (!) 89% 93% 93%  Weight:      Height:        General exam: Appears calm and comfortable ,Not in distress, on nasal cannula oxygen HEENT:PERRL,Oral mucosa moist Respiratory system: Diminished breath sounds bilaterally.   Cardiovascular system: S1 & S2 heard, RRR.   Gastrointestinal system: Abdomen is nondistended, soft and nontender. No organomegaly or masses felt. Normal bowel sounds heard. Central nervous system: Alert and oriented. No focal neurological deficits. Extremities: No edema, no clubbing ,no cyanosis, distal peripheral pulses palpable. Skin: No rashes, lesions or ulcers,no icterus ,no pallor MSK: Normal muscle bulk,tone ,power    Procedures:    BiPAP.  The results of significant diagnostics from this hospitalization (including imaging, microbiology, ancillary and laboratory) are listed below for reference.     Diagnostic Studies:   Dg Chest 2 View  Result Date: 03/06/2019 CLINICAL DATA:  Lethargy EXAM: CHEST - 2 VIEW COMPARISON:  12/16/2018 FINDINGS: Cardiomegaly. Nodular opacity of the right upper lobe unchanged from prior. Emphysematous change. No new or focal airspace opacity. IMPRESSION: Cardiomegaly. Nodular opacity of the right upper lobe unchanged from prior. Emphysematous change. No new or focal airspace opacity. Electronically Signed   By: Eddie Candle M.D.   On: 03/06/2019 23:58     Labs:   Basic Metabolic Panel: Recent Labs  Lab 03/06/19 2319 03/07/19 0514 03/08/19 0325 03/09/19 0343  NA 141 140 139 142  K 4.4 4.5 5.0 3.7  CL 93* 93* 97* 100  CO2 39* 36* 34* 36*  GLUCOSE 107* 133* 154* 108*  BUN 11 10 11 13   CREATININE 0.73 0.60 0.56 0.64  CALCIUM 9.4 9.0 8.7* 8.6*  MG  --  1.7  --   --    GFR Estimated Creatinine Clearance: 46.6 mL/min (by C-G formula based on SCr of 0.64 mg/dL). Liver Function Tests: Recent Labs  Lab 03/06/19 2319 03/07/19 0514  AST 28 24  ALT 22 17  ALKPHOS 50 46  BILITOT 0.6 0.8  PROT 7.1 6.5  ALBUMIN 4.1 3.7   No results for input(s): LIPASE, AMYLASE in the last 168 hours. No results for input(s): AMMONIA in the last 168 hours. Coagulation profile No results for input(s): INR, PROTIME in the last 168 hours.  CBC: Recent Labs  Lab 03/06/19 2319 03/07/19 0514 03/08/19  0325 03/09/19 0343  WBC 5.8 5.8 7.8 7.7  NEUTROABS 4.2  --   --   --   HGB 13.9 12.5 10.9* 11.1*  HCT 48.7* 42.8 37.1 37.6  MCV 106.8* 104.9* 103.1* 101.9*  PLT 150 139* 130* 140*   Cardiac Enzymes: No results for input(s): CKTOTAL, CKMB, CKMBINDEX, TROPONINI in the last 168 hours. BNP: Invalid input(s): POCBNP CBG: Recent Labs  Lab 03/06/19 2327  GLUCAP 105*   D-Dimer No results for input(s): DDIMER in the last 72 hours. Hgb A1c No results for input(s): HGBA1C in the last 72 hours. Lipid Profile No results for input(s): CHOL, HDL, LDLCALC, TRIG, CHOLHDL, LDLDIRECT in the last 72 hours. Thyroid function studies No results for input(s): TSH, T4TOTAL, T3FREE, THYROIDAB in the last 72 hours.  Invalid input(s): FREET3 Anemia work up No results for input(s): VITAMINB12, FOLATE, FERRITIN, TIBC, IRON, RETICCTPCT in the last 72 hours. Microbiology Recent Results (from the past 240 hour(s))  SARS Coronavirus 2 (CEPHEID - Performed in [place] hospital lab), Hosp Order     Status: None   Collection Time: 03/07/19 12:47  AM  Result Value Ref Range Status   SARS Coronavirus 2 NEGATIVE NEGATIVE Final    Comment: (NOTE) If result is NEGATIVE SARS-CoV-2 target nucleic acids are NOT DETECTED. The SARS-CoV-2 RNA is generally detectable in upper and lower  respiratory specimens during the acute phase of infection. The lowest  concentration of SARS-CoV-2 viral copies this assay can detect is 250  copies / mL. A negative result does not preclude SARS-CoV-2 infection  and should not be used as the sole basis for treatment or other  patient management decisions.  A negative result may occur with  improper specimen collection / handling, submission of specimen other  than nasopharyngeal swab, presence of viral mutation(s) within the  areas targeted by this assay, and inadequate number of viral copies  (<250 copies / mL). A negative result must be combined with clinical  observations,  patient history, and epidemiological information. If result is POSITIVE SARS-CoV-2 target nucleic acids are DETECTED. The SARS-CoV-2 RNA is generally detectable in upper and lower  respiratory specimens dur ing the acute phase of infection.  Positive  results are indicative of active infection with SARS-CoV-2.  Clinical  correlation with patient history and other diagnostic information is  necessary to determine patient infection status.  Positive results do  not rule out bacterial infection or co-infection with other viruses. If result is PRESUMPTIVE POSTIVE SARS-CoV-2 nucleic acids MAY BE PRESENT.   A presumptive positive result was obtained on the submitted specimen  and confirmed on repeat testing.  While 2019 novel coronavirus  (SARS-CoV-2) nucleic acids may be present in the submitted sample  additional confirmatory testing may be necessary for epidemiological  and / or clinical management purposes  to differentiate between  SARS-CoV-2 and other Sarbecovirus currently known to infect humans.  If clinically indicated additional testing with an alternate test  methodology [phone]) is advised. The SARS-CoV-2 RNA is generally  detectable in upper and lower respiratory sp ecimens during the acute  phase of infection. The expected result is Negative. Fact Sheet for Patients:  StrictlyIdeas.no Fact Sheet for Healthcare Providers: BankingDealers.co.za This test is not yet approved or cleared by the [place] FDA and has been authorized for detection and/or diagnosis of SARS-CoV-2 by FDA under an Emergency Use Authorization (EUA).  This EUA will remain in effect (meaning this test can be used) for the duration of the COVID-19 declaration under Section 564(b)(1) of the Act, 21 U.S.C. section 360bbb-3(b)(1), unless the authorization is terminated or revoked sooner. Performed at [facility], [place] [address] [place], [place]  [address]      Discharge Instructions:   Discharge Instructions    Diet general   Complete by:  As directed    Discharge instructions   Complete by:  As directed    Follow up with your primary care provider in 1 week, pulmonary physician as scheduled by you.   Increase activity slowly   Complete by:  As directed      Allergies as of 03/10/2019      Reactions   Alendronate Sodium Other (See Comments)   Patient does not remember this reaction      Medication List    TAKE these medications   albuterol (2.5 MG/3ML) 0.083% nebulizer solution Commonly known as:  PROVENTIL Take 3 mLs (2.5 mg total) by nebulization every 6 (six) hours as needed for wheezing or shortness of breath. What changed:  Another medication with the same name was changed. Make sure you understand how and when  to take each.   Ventolin HFA 108 (90 Base) MCG/ACT inhaler Generic drug:  albuterol INHALE 1 TO 2 PUFFS INTO THE LUNGS EVERY 6 (SIX) HOURS AS NEEDED FOR WHEEZING OR SHORTNESS OF BREATH. What changed:  See the new instructions.   aspirin 81 MG EC tablet Take 1 tablet (81 mg total) by mouth daily.   atorvastatin 10 MG tablet Commonly known as:  Lipitor Take 1 tablet (10 mg total) by mouth daily.   bisacodyl 5 MG EC tablet Commonly known as:  DULCOLAX Take 5 mg by mouth daily as needed for moderate constipation.   citalopram 20 MG tablet Commonly known as:  CELEXA Take 1 tablet (20 mg total) by mouth daily.   fluticasone furoate-vilanterol 200-25 MCG/INH Aepb Commonly known as:  BREO ELLIPTA Inhale 1 puff into the lungs daily.   furosemide 40 MG tablet Commonly known as:  Lasix 1 tab by mouth in the AM daily, with 1 tab in the PM as needed for persistent swelling What changed:    how much to take  how to take this  when to take this  additional instructions   levothyroxine 75 MCG tablet Commonly known as:  SYNTHROID TAKE 1 TABLET BY MOUTH DAILY BEFORE BREAKFAST. What changed:  See  the new instructions.   nicotine 21 mg/24hr patch Commonly known as:  NICODERM CQ - dosed in mg/24 hours Place 1 patch (21 mg total) onto the skin daily.   OXYGEN Inhale 3 L into the lungs. 24/7   potassium chloride 10 MEQ tablet Commonly known as:  K-DUR Take 1 tablet (10 mEq total) by mouth daily.   predniSONE 10 MG (21) Tbpk tablet Commonly known as:  STERAPRED UNI-PAK 21 TAB 3 tab orally daily x 2 days then 2 tab po daily x 2 days then 1 tab po daily x 2 days   PRESCRIPTION MEDICATION Bipap   Spiriva HandiHaler 18 MCG inhalation capsule Generic drug:  tiotropium PLACE 1 CAPSULE (18 MCG TOTAL) INTO INHALER AND INHALE DAILY. What changed:  See the new instructions.   traMADol 50 MG tablet Commonly known as:  ULTRAM Take 1 tablet (50 mg total) by mouth 2 (two) times daily as needed. What changed:  reasons to take this       Time coordinating discharge: 39 minutes  Signed:  Blanchie Zeleznik  [facility] 03/10/2019, 8:55 AM

## 2019-03-10 NOTE — Progress Notes (Signed)
0620: pt had the Bipap on all night. Bipap removed at 0620 and pt switched to O2 3 L. Patient tolerated well.

## 2019-03-10 NOTE — TOC Transition Note (Signed)
Transition of Care [facility]) - CM/SW Discharge Note Jasmine Gibbons RN, BSN Transitions of Care Unit 4E- RN Case Manager [phone]   Patient Details  Name: Jasmine [name] MRN: [id number] Date of Birth: 11/08/1945  Transition of Care [facility]) CM/SW Contact:  Jasmine Patricia, RN Phone Number: 03/10/2019, 11:20 AM   Clinical Narrative:    Pt admitted with acute resp. Failure, hx of Trilogy use at home and home 02 with [facility] now known as [place]. Asked to see if [employer] could check out home Trilogy while pt is here is hospital- arrangement made to have family come pick up machine and take to [facility] store on [address] to have machine looked at. This was done on 5/20 and [facility] found no problems with machine. Plan will be to have RT from [place] go to pt's home on discharge to review machine settings and functions with pt on discharge.  CM has spoken with Jasmine [name] from [employer] to arrange RT visit on 5/21 upon transition home. Pt is also followed by [facility] who will f/u with pt in the home. Patient is active with PC services via [facility].    Final next level of care: Home/Self Care Barriers to Discharge: No Barriers Identified   Patient Goals and CMS Choice Patient states their goals for this hospitalization and ongoing recovery are:: "getting back home" CMS Medicare.gov Compare Post Acute Care list provided to:: Patient Choice offered to / list presented to : Patient  Discharge Placement  Home.                      Discharge Plan and Services   Discharge Planning Services: CM Consult Post Acute Care Choice: NA          DME Arranged: N/A DME Agency: NA       HH Arranged: NA HH Agency: NA        Social Determinants of Health (SDOH) Interventions     Readmission Risk Interventions Readmission Risk Prevention Plan 03/10/2019  Transportation Screening Complete  Medication Review Press photographer) Complete  PCP or Specialist appointment within 3-5 days of discharge  Complete  HRI or [place] Complete  SW Recovery Care/Counseling Consult Complete  [place] Not Applicable  Some recent data might be hidden

## 2019-03-10 NOTE — Care Management Important Message (Signed)
Important Message  Patient Details  Name: Jasmine [name] MRN: [id number] Date of Birth: 08/24/1946   Medicare Important Message Given:  Yes    Orbie Pyo 03/10/2019, 2:05 PM

## 2019-03-10 NOTE — Progress Notes (Signed)
D/C instructions given to pt. Medications reviewed. All questions answered. Home Bipap and belonging sent home w/ pt. IV removed, clean and intact. Husband to escort pt home.  Clyde Canterbury, RN

## 2019-03-11 ENCOUNTER — Telehealth: Payer: Self-pay

## 2019-03-11 ENCOUNTER — Other Ambulatory Visit: Payer: Self-pay | Admitting: *Deleted

## 2019-03-11 NOTE — Patient Outreach (Signed)
[facility]) Care Management  03/11/2019  Jasmine [name] 1946-04-11 [id number]   New referral received as member was readmitted to hospital for COPD complications.  Primary MD office will complete transition of care assessment.  Per chart, she also has history of hypertension, heart failure, hypothyroidism, and asthma.  Call placed to member for telephone assessment.  She report she is doing much better, denies any shortness of breath at this time but does state the used her rescue inhaler today upon waking up.  Report she did use her CPAP machine last night, state it was serviced and assessed by the home heath team.  COPD action plan discussed as well as trying to use rescue inhaler only when needed, using scheduled inhalers as indicated.    Primary MD visit scheduled for 6/3, pulmonology visit scheduled for July.  Advised to inquire about an appointment sooner since she was recently discharged for COPD complications.  Denies any urgent concerns at this time.  Still has her ex-husband and other family available for assistance.  Will follow up within the next 2 weeks.  THN CM Care Plan Problem One     Most Recent Value  Care Plan Problem One  Risk for readmission related to COPD management as evidenced by recent hospital admission  Role Documenting the Problem One  Care Management [place] for Problem One  Active  [facility] Long Term Goal   Member will not be readmitted to hospital within the next 31 days  THN Long Term Goal Start Date  03/11/19  Interventions for Problem One Long Term Goal  Discharge instructions reviewed with member, medications reviewed for changes.  THN CM Short Term Goal #1   Member will attend follow up appointment with primary MD and pulmonologist within the next 2 weeks  THN CM Short Term Goal #1 Start Date  03/11/19  Interventions for Short Term Goal #1  Visit with PCP confirmed, advised to schedule with pulmonary within the next 2 weeks  THN  CM Short Term Goal #2   Member will verbalize correct use of inhalers and CPAP machine within the next 2 weeks  THN CM Short Term Goal #2 Start Date  03/11/19  Interventions for Short Term Goal #2  Member educated on use of scheduled inhalers versus rescue inhalers and use of CPAP daily.  Confirmed she has contact information for DME troubleshooting.     Valente David, [place], MSN [place] [phone]

## 2019-03-11 NOTE — Telephone Encounter (Signed)
Transition Care Management Follow-up Telephone Call   Date discharged? 03/10/2019  How have you been since you were released from the hospital? Pt states feeling better.   Do you understand why you were in the hospital? yes  Do you understand the discharge instructions? yes  Where were you discharged to? Home  Items Reviewed:  Medications reviewed: yes  Allergies reviewed: yes  Dietary changes reviewed: yes  Referrals reviewed: yes   Functional Questionnaire:   Activities of Daily Living (ADLs):   States they are independent in the following: All  States they require assistance with the following: none   Any transportation issues/concerns?: no  Any patient concerns? Not at this time.   Confirmed importance and date/time of follow-up visits scheduled:   Provider Appointment booked with PCP on June 3rd  Confirmed with patient if condition begins to worsen call PCP or go to the ER: Confirmed Patient was given the office number and encouraged to call back with question or concerns: Yes

## 2019-03-13 ENCOUNTER — Encounter: Payer: Self-pay | Admitting: *Deleted

## 2019-03-18 ENCOUNTER — Telehealth: Payer: Self-pay | Admitting: Pulmonary Disease

## 2019-03-18 NOTE — Telephone Encounter (Signed)
Spoke with Amy at [place].  They will have to go to patient's home to get download from the Astral and will fax to [place] next week after 03/21/19.  Requested to send report under Dr. Bari Mantis name for his review. Nothing further needed today.

## 2019-03-18 NOTE — Telephone Encounter (Signed)
I received in order to sign for her trilogy. Please obtain a compliance report

## 2019-03-21 ENCOUNTER — Other Ambulatory Visit: Payer: Self-pay | Admitting: *Deleted

## 2019-03-21 DIAGNOSIS — J9612 Chronic respiratory failure with hypercapnia: Secondary | ICD-10-CM | POA: Diagnosis not present

## 2019-03-21 DIAGNOSIS — J449 Chronic obstructive pulmonary disease, unspecified: Secondary | ICD-10-CM | POA: Diagnosis not present

## 2019-03-21 DIAGNOSIS — B9561 Methicillin susceptible Staphylococcus aureus infection as the cause of diseases classified elsewhere: Secondary | ICD-10-CM | POA: Diagnosis not present

## 2019-03-21 DIAGNOSIS — R7881 Bacteremia: Secondary | ICD-10-CM | POA: Diagnosis not present

## 2019-03-21 NOTE — Patient Outreach (Signed)
[facility]) Care Management  03/21/2019  Jasmine [name] 1946/02/05 [id number]   Call placed to member to complete telephone assessment.  She report she is busy at the moment and will call this care manager back.    Call received back from member however unable to complete assessment.  State she has [place] care present for assessment of her CPAP machine.  After which she will be busy running errands with her family. State she will call this care manager back tomorrow.  Will await call back, if no call back will follow up within the next week.  Valente David, [place], MSN [place] [phone]

## 2019-03-22 ENCOUNTER — Other Ambulatory Visit: Payer: Self-pay | Admitting: Internal Medicine

## 2019-03-23 ENCOUNTER — Ambulatory Visit: Payer: Medicare HMO | Admitting: Internal Medicine

## 2019-03-26 DIAGNOSIS — J449 Chronic obstructive pulmonary disease, unspecified: Secondary | ICD-10-CM | POA: Diagnosis not present

## 2019-03-28 ENCOUNTER — Telehealth: Payer: Self-pay | Admitting: Pulmonary Disease

## 2019-03-28 ENCOUNTER — Other Ambulatory Visit: Payer: Self-pay | Admitting: *Deleted

## 2019-03-28 DIAGNOSIS — J9601 Acute respiratory failure with hypoxia: Secondary | ICD-10-CM

## 2019-03-28 NOTE — Telephone Encounter (Signed)
Returned call to [place]. Pt having issues with Trilogy Need to f/u and see if download was received and make recommendations. Leave open   03/18/19 Download was being faxed over for review.  Note    Spoke with Amy at [place].  They will have to go to patient's home to get download from the Astral and will fax to [place] next week after 03/21/19.  Requested to send report under Dr. Bari Mantis name for his review. Nothing further needed today.

## 2019-03-28 NOTE — Patient Outreach (Signed)
[facility]) Care Management  03/28/2019  Jasmine [name] [birth year] [id number]   Call placed to member to follow up on COPD management and to complete initial assessment.  She report she still does not think her machine is working (noted that she has Trilogy non-invasive vent instead of CPAP/BIPAP).  She does confirm that Waunita Schooner from [facility] was out last week to service machine and provided her with a new mask and new tubing.  This care manager inquired about oxygen saturation monitoring, she report daily readings between 90-95%.  Denies any active shortness of breath but state she still does have trouble breathing intermittently.  Attempted to have member elaborate about issues with machine, she is unable to verbalize, repeatedly saying "I don't know, I just don't know."    Inquired about follow up appointments with pulmonology and primary MD.  State she was to follow up via telemed visit last week with PCP but she didn't receive a call.  She will call back to office today to reschedule.  Appointment with pulmonology is 7/9.  Assessment complete, denies any urgent concerns however she required re-direction and attempts to have her focus on questions multiple times during assessment today.  She was alert and oriented x3.  Call placed to [place] to follow up on issues with machine.  Spoke to [place], she confirmed Waunita Schooner did make home visit last week and noted machine to be working properly.  She state the same machine was with member while she was hospitalized and it worked properly at that time as well.  There is concern that member may be having issues with operating the machine independently.  She report there was a request for pulmonology office to either change settings or give permission to change to machine that would be easier for member to operate.  State they are still waiting for feedback from MD office.  Call then placed to MD office to inquire about request. Spoke  with Katharine Look, she confirms that they did receive request but were waiting for more information to be faxed from the Trilogy compliance report.  She will check for fax and collaborate with her team prior to follow up with this care manager.  This care manager will follow up with member within the next week.  Fall Risk  03/28/2019 01/06/2019 11/29/2018 11/24/2018 09/20/2018  Falls in the past year? 0 0 1 0 1  Number falls in past yr: - 0 1 - 0  Comment - - - - tripped on rug in [place]  Injury with Fall? - 0 0 - 0  Risk Factor Category  - - - - -  Risk for fall due to : - - History of fall(s);Impaired balance/gait;Impaired mobility - -  Follow up - - Falls evaluation completed;Falls prevention discussed - -   Depression screen [facility] 2/9 03/28/2019 11/29/2018 11/24/2018 09/20/2018 08/05/2018  Decreased Interest 0 1 1 - 0  Down, Depressed, Hopeless 1 2 2  0 0  PHQ - 2 Score 1 3 3  0 0  Altered sleeping - - 1 - -  Tired, decreased energy - 2 2 - -  Change in appetite - 1 1 - -  Feeling bad or failure about yourself  - 1 1 - -  Trouble concentrating - 0 0 - -  Moving slowly or fidgety/restless - 0 0 - -  Suicidal thoughts - 0 0 - -  PHQ-9 Score - - 8 - -  Difficult doing work/chores - Somewhat difficult Not  difficult at all - -  Some recent data might be hidden   [facility] CM Care Plan Problem One     Most Recent Value  Care Plan Problem One  Risk for readmission related to COPD management as evidenced by recent hospital admission  Role Documenting the Problem One  Care Management [place] for Problem One  Active  [facility] Long Term Goal   Member will not be readmitted to hospital within the next 31 days  THN Long Term Goal Start Date  03/11/19  Interventions for Problem One Long Term Goal  Re-educated on COPD action plan and zones.  THN CM Short Term Goal #1   Member will attend follow up appointment with primary MD and pulmonologist within the next 2 weeks  THN CM Short Term Goal #1 Start Date  03/11/19   Interventions for Short Term Goal #1  Advised to contact MD office to reschedule tele visit as she did not have visit last week as planned  [facility] CM Short Term Goal #2   Member will verbalize correct use of inhalers and Trilogy machine within the next 2 weeks  THN CM Short Term Goal #2 Start Date  03/11/19  Interventions for Short Term Goal #2  Call placed to [employer] [facility] and Pulmonology office to inquired about member's concerns for use of machine     Valente David, Therapist, sports, MSN [place] Manager [phone]

## 2019-03-29 DIAGNOSIS — B9561 Methicillin susceptible Staphylococcus aureus infection as the cause of diseases classified elsewhere: Secondary | ICD-10-CM | POA: Diagnosis not present

## 2019-03-29 DIAGNOSIS — J449 Chronic obstructive pulmonary disease, unspecified: Secondary | ICD-10-CM | POA: Diagnosis not present

## 2019-03-29 DIAGNOSIS — J9612 Chronic respiratory failure with hypercapnia: Secondary | ICD-10-CM | POA: Diagnosis not present

## 2019-03-29 DIAGNOSIS — R7881 Bacteremia: Secondary | ICD-10-CM | POA: Diagnosis not present

## 2019-03-29 NOTE — Telephone Encounter (Signed)
Called [employer] and spoke with Amy. According to Amy respiratory went out and was not able to get a download on 03/10/19. She says we need to send a order to switch out trilogy machine. I asked if an order had been faxed over she says no. DME order placed. THN [place] updated Nothing further needed.

## 2019-04-03 DIAGNOSIS — J9612 Chronic respiratory failure with hypercapnia: Secondary | ICD-10-CM | POA: Diagnosis not present

## 2019-04-03 DIAGNOSIS — J449 Chronic obstructive pulmonary disease, unspecified: Secondary | ICD-10-CM | POA: Diagnosis not present

## 2019-04-03 DIAGNOSIS — R7881 Bacteremia: Secondary | ICD-10-CM | POA: Diagnosis not present

## 2019-04-03 DIAGNOSIS — B9561 Methicillin susceptible Staphylococcus aureus infection as the cause of diseases classified elsewhere: Secondary | ICD-10-CM | POA: Diagnosis not present

## 2019-04-04 ENCOUNTER — Other Ambulatory Visit: Payer: Self-pay | Admitting: *Deleted

## 2019-04-04 NOTE — Patient Outreach (Signed)
[facility]) Care Management  04/04/2019  Jasmine [name] 05-22-1946 [id number]   Call placed to member to follow up on use of Trilogy machine and contact from [employer] [facility] regarding switching machine.  She report she is still using the Trilogy machine with problems.  She is advised that there has been a request for to switch machine due to it not working properly.  Denies being in contact with [employer] [facility] as of yet, but state will call later this week if she still has not heard anything.  Denies any urgent concerns at this time, will follow up within the next 2 weeks.  Valente David, [place], MSN [place] [phone]

## 2019-04-05 ENCOUNTER — Encounter: Payer: Self-pay | Admitting: *Deleted

## 2019-04-05 IMAGING — CT CT HEAD W/O CM
4 series · 16 of 47 positions shown, 18 images · non-contrast
Comparison: 01/07/2018

CLINICAL DATA: Altered level of consciousness

EXAM:
CT HEAD WITHOUT CONTRAST
TECHNIQUE: Contiguous axial images were obtained from the base of the skull
through the vertex without intravenous contrast.

[Series 3: head wo · axial · 0.43mm/px · z∈[+1088,+1202]mm · 7 of 31 slices shown, 9 images]
[im 4/31  brain]
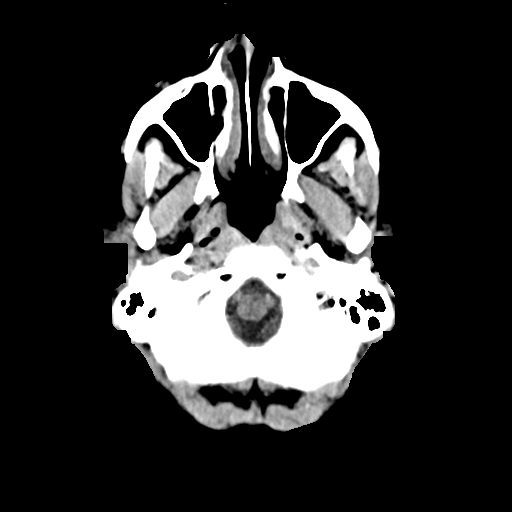
[im 4/31  bone]
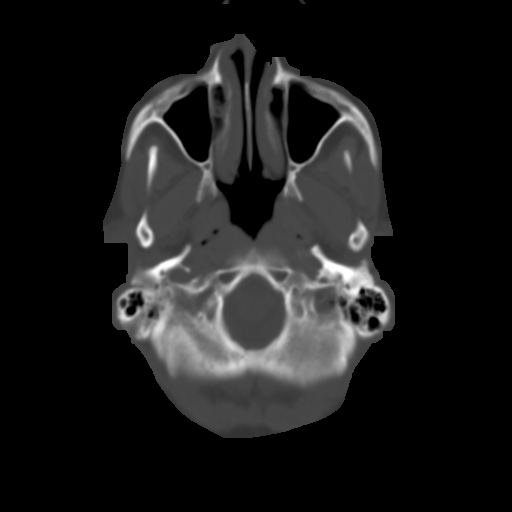
[im 8/31  brain]
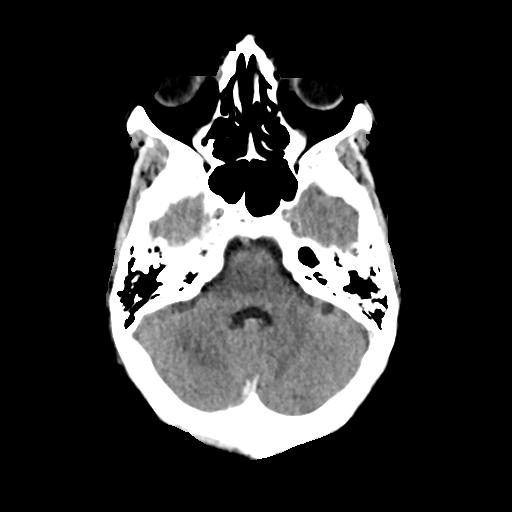
[im 12/31  brain]
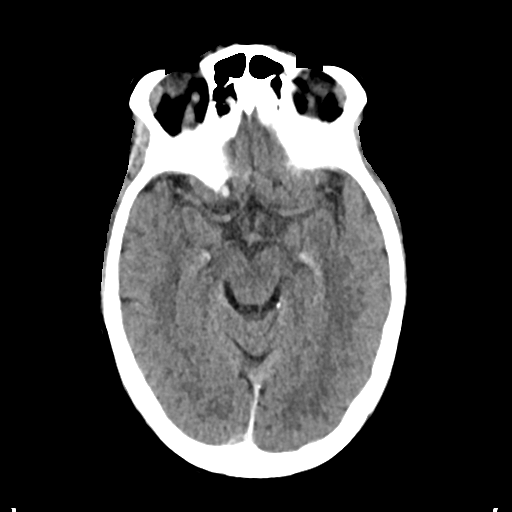
[im 16/31  brain]
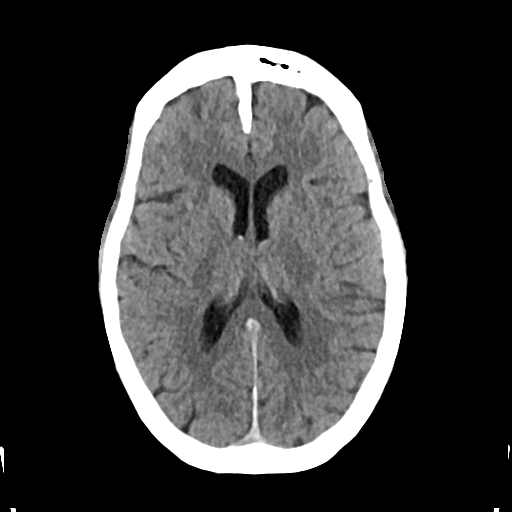
[im 19/31  brain]
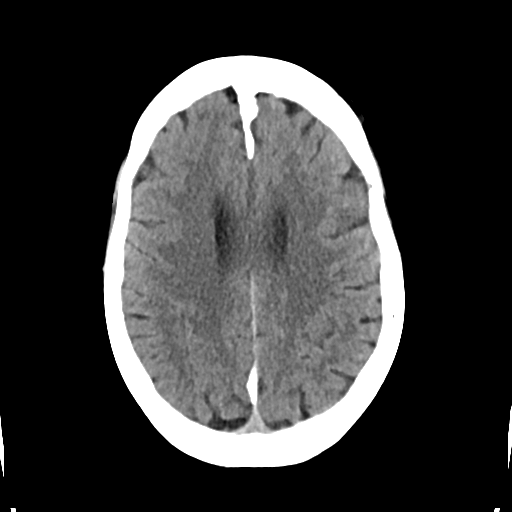
[im 19/31  bone]
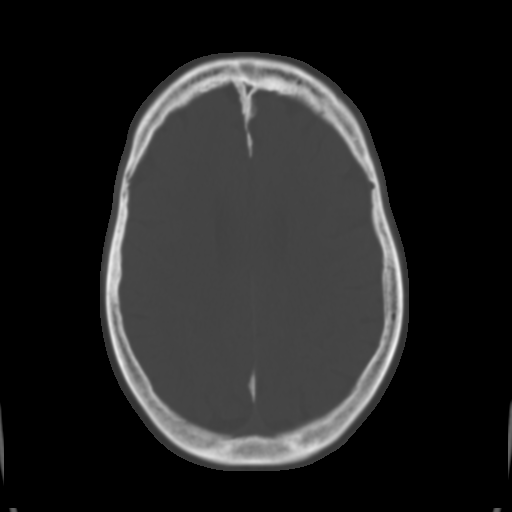
[im 23/31  brain]
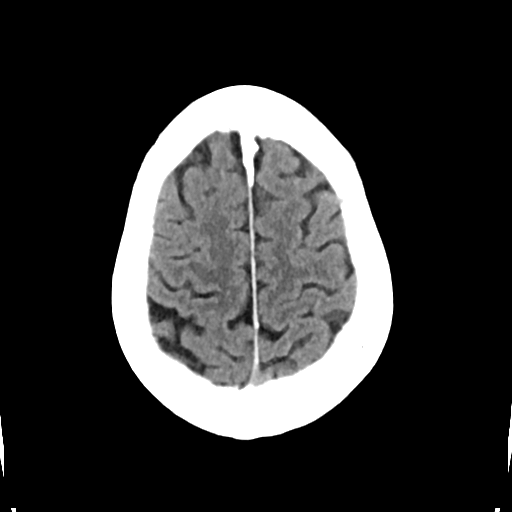
[im 27/31  brain]
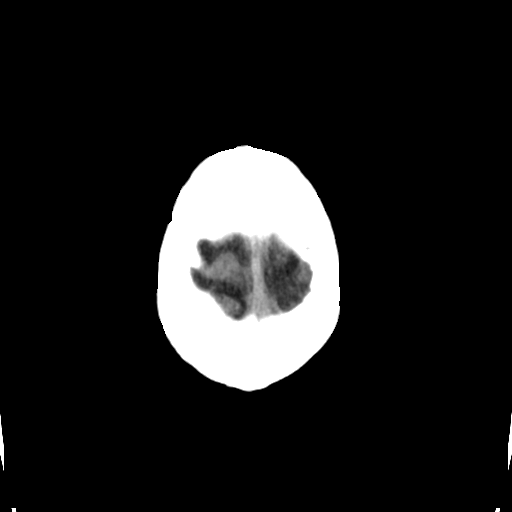

[Series 4: head bone · axial · 0.43mm/px · z∈[+1086,+1118]mm · 3 of 78 slices shown]
[im 8/78  bone]
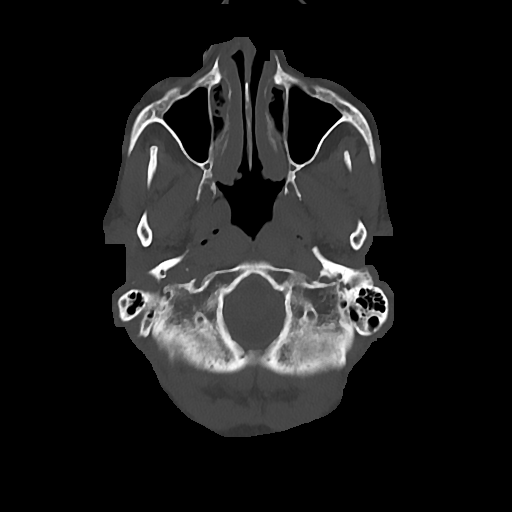
[im 16/78  bone]
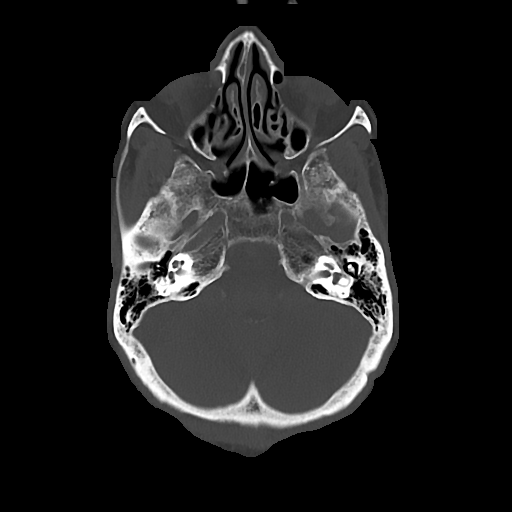
[im 24/78  bone]
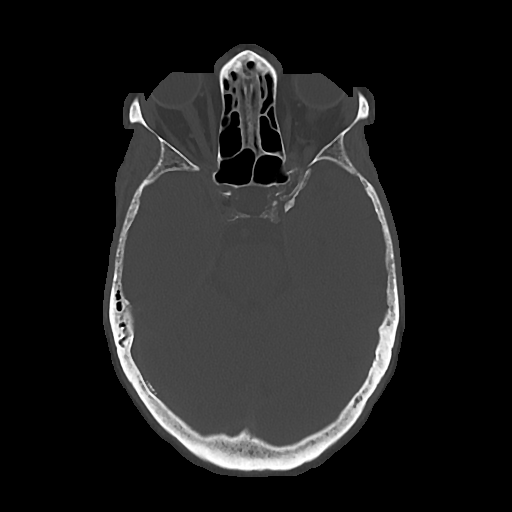

[Series 5: cor soft · coronal · 0.30mm/px · 3 of 69 slices shown]
[im 23/69  brain]
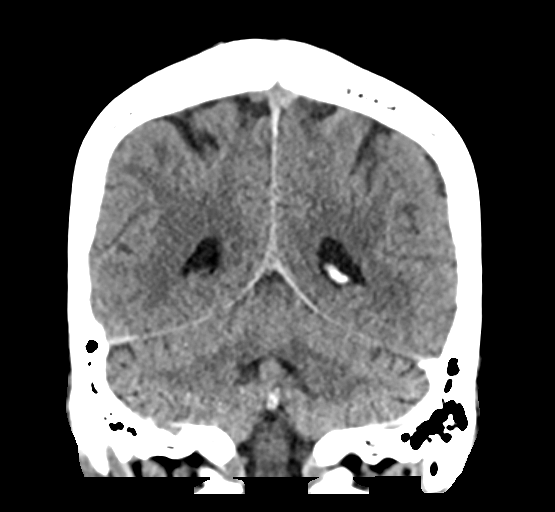
[im 31/69  brain]
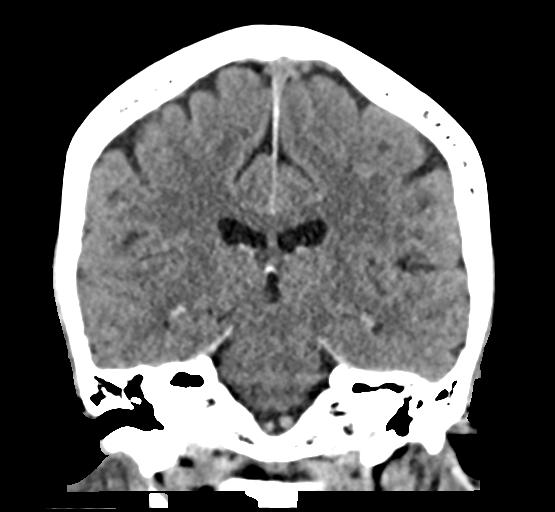
[im 38/69  brain]
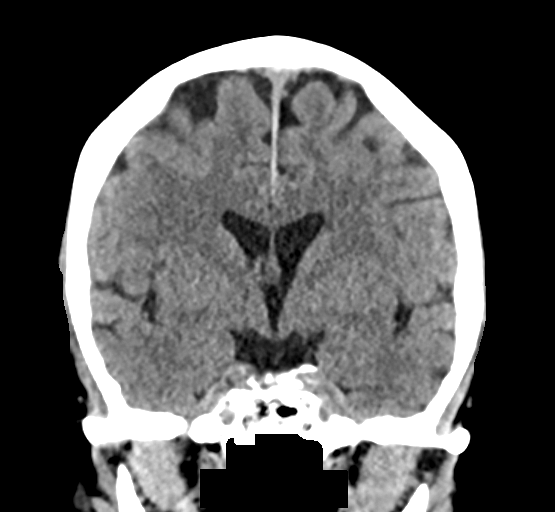

[Series 6: sag soft · sagittal · 0.30mm/px · 3 of 55 slices shown]
[im 19/55  brain]
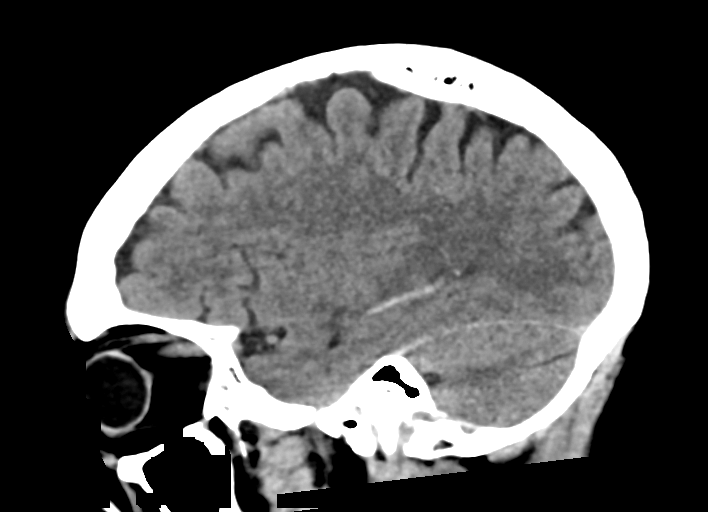
[im 28/55  brain]
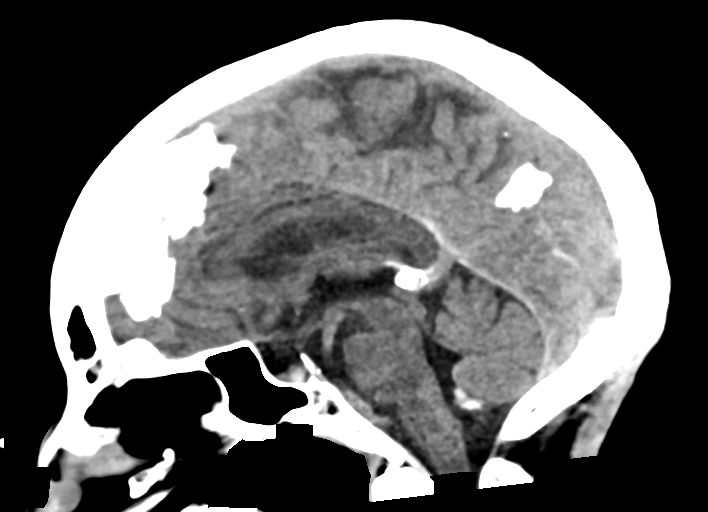
[im 37/55  brain]
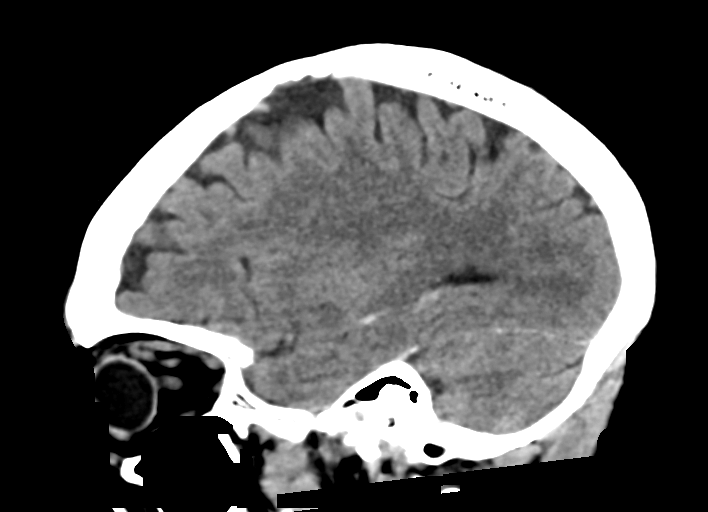

[16 of 47 positions shown; findings below may reference images not displayed]

FINDINGS: Brain: No acute intracranial abnormality. Specifically, no
hemorrhage, hydrocephalus, mass lesion, acute infarction, or
significant intracranial injury.

Vascular: No hyperdense vessel or unexpected calcification.

Skull: No acute calvarial abnormality.

Sinuses/Orbits: Visualized paranasal sinuses and mastoids clear.
Orbital soft tissues unremarkable.

Other: None
IMPRESSION: No acute intracranial abnormality.

## 2019-04-08 ENCOUNTER — Telehealth: Payer: Self-pay | Admitting: Pulmonary Disease

## 2019-04-08 NOTE — Telephone Encounter (Signed)
Called and spoke to pt. Pt states she got a call from someone regarding her insurance and monthly billing. Nothing in our chart indicates we called. However, we have a recent referral in the chart with [employer] with the [facility]. Pt states she will call the DME and ask. Advised pt to call us back if she has any issues. Pt verbalized understanding and denied any further questions or concerns at this time.

## 2019-04-18 ENCOUNTER — Other Ambulatory Visit: Payer: Self-pay | Admitting: *Deleted

## 2019-04-18 ENCOUNTER — Telehealth: Payer: Self-pay | Admitting: Adult Health

## 2019-04-18 NOTE — Telephone Encounter (Signed)
Spoke to pt and she complains of air leakage that wakes her up and does not let her fall back to sleep.  Pt also states she is having trouble cleaning the tubing and machine every morning.   Pt was interested I getting a "Vitruclean machine" if it was compatible with her BiPap.

## 2019-04-18 NOTE — Patient Outreach (Signed)
[facility]) Care Management  04/18/2019  Jasmine [name] 1946/07/13 [id number]   Call placed to member to follow up on use of Trilogy non-invasive machine and potential new machine.  She report she is still using the Trilogy, but still having intermitted problems with air flow.  State she has not received any update on possibility of switching machine.  Per MD office note on 6/8, DME order has been placed.  Member will contact [place] as well as MD office to follow up on orders for new machine.  Advised to continue to use current machine until new one has been received.  She denies any shortness of breath or chest discomfort at this time.  Denies any urgent concerns.  Will follow up within the next 2 weeks.   THN CM Care Plan Problem One     Most Recent Value  Care Plan Problem One  Risk for readmission related to COPD management as evidenced by recent hospital admission  Role Documenting the Problem One  Care Management [place] for Problem One  Active  THN Long Term Goal   Member will not be readmitted to hospital within the next 31 days  THN Long Term Goal Start Date  03/11/19  [facility] Long Term Goal Met Date  04/18/19  [facility] CM Short Term Goal #1   Member will attend follow up appointment with primary MD and pulmonologist within the next 2 weeks  THN CM Short Term Goal #1 Start Date  03/11/19  [facility] CM Short Term Goal #1 Met Date  -- [Not met]  THN CM Short Term Goal #2   Member will verbalize correct use of inhalers and Trilogy machine within the next 2 weeks  THN CM Short Term Goal #2 Start Date  03/11/19  [facility] CM Short Term Goal #2 Met Date  04/18/19  [facility] CM Short Term Goal #3  Member will have be able to verbalize plan for new machine within the next 2 weeks  THN CM Short Term Goal #3 Start Date  04/18/19  Interventions for Short [place] Goal #3  Reviewed chart for confirmation of order, advised to contact MD office and DME office for update.      Valente David, [place], MSN [place] [phone]

## 2019-04-20 ENCOUNTER — Telehealth: Payer: Self-pay | Admitting: Pulmonary Disease

## 2019-04-20 DIAGNOSIS — H938X3 Other specified disorders of ear, bilateral: Secondary | ICD-10-CM

## 2019-04-20 DIAGNOSIS — J329 Chronic sinusitis, unspecified: Secondary | ICD-10-CM

## 2019-04-20 NOTE — Telephone Encounter (Signed)
Dr. Elsworth Soho, please advise on this for pt and recommendations you might have to help for pt. Thanks!

## 2019-04-20 NOTE — Telephone Encounter (Signed)
Called and spoke with pt who called wanting to know if she needed a referral for an allergist.  Pt states that she has a lot of congestion with her ears to the point that she can barely hear from all the congestion.  After further speaking with pt, stated to pt that it seems like the referral needs to be for an ENT not an allergist and pt verbalized understanding.  Dr. Elsworth Soho, please advise if you are okay with [place] referring pt to an ENT to further have her ears evaluated. Thank you!

## 2019-04-20 NOTE — Telephone Encounter (Signed)
Called that patient and advised her of the Dr. Bari Mantis instructions. Patient stated that she has received the new CPAP machine. Advised her to use as Dr. Elsworth Soho would have directed. Confirmed future appointments with patient.  Patient will not have been using the CPAP machine for 30 days when see comes in to see Rexene Edison, NP on 04/28/19, but that the report information can be printed for Dr. Elsworth Soho to review. Patient voiced understanding. Nothing further needed at this time.

## 2019-04-20 NOTE — Telephone Encounter (Signed)
Discontinue Trelegy and we can discuss new machine on her follow-up visit

## 2019-04-21 NOTE — Telephone Encounter (Signed)
Spoke with patient. She is aware that we will place the referral for the ENT doctor today. She verbalized understanding. Nothing further needed at time of call.

## 2019-04-21 NOTE — Telephone Encounter (Signed)
Due to RA not answering message yesterday 7/1, sending to APP of the day.  Tonya, please advise if you are okay with [place] referring pt to an ENT at her request due to having problems with her ears. Thanks!

## 2019-04-21 NOTE — Telephone Encounter (Signed)
Yes. I think that should be fine.

## 2019-04-25 ENCOUNTER — Telehealth: Payer: Self-pay | Admitting: Adult Health

## 2019-04-25 DIAGNOSIS — J449 Chronic obstructive pulmonary disease, unspecified: Secondary | ICD-10-CM | POA: Diagnosis not present

## 2019-04-25 NOTE — Telephone Encounter (Signed)
lmtcb for pt.  

## 2019-04-26 NOTE — Telephone Encounter (Signed)
Called and spoke with pt letting her know that we could make her visit with VS on 7/16 be a televisit so that way she would not have to leave the house and pt stated she would like that. I have made pt's OV 7/16 with VS a televisit.nothing further needed.

## 2019-04-28 ENCOUNTER — Ambulatory Visit: Payer: Medicare HMO | Admitting: Adult Health

## 2019-04-28 DIAGNOSIS — R7881 Bacteremia: Secondary | ICD-10-CM | POA: Diagnosis not present

## 2019-04-28 DIAGNOSIS — B9561 Methicillin susceptible Staphylococcus aureus infection as the cause of diseases classified elsewhere: Secondary | ICD-10-CM | POA: Diagnosis not present

## 2019-04-28 DIAGNOSIS — J449 Chronic obstructive pulmonary disease, unspecified: Secondary | ICD-10-CM | POA: Diagnosis not present

## 2019-04-28 DIAGNOSIS — J9612 Chronic respiratory failure with hypercapnia: Secondary | ICD-10-CM | POA: Diagnosis not present

## 2019-05-02 ENCOUNTER — Other Ambulatory Visit: Payer: Self-pay | Admitting: *Deleted

## 2019-05-02 NOTE — Patient Outreach (Signed)
[facility] [facility]) Care Management  05/02/2019  Jasmine [name] Apr 01, 1946 [id number]   Call placed to member to follow up on COPD management and new machine.  No answer, HIPAA compliant voice message left, will follow up within the next 3-4 business days.  Valente David, [place], MSN [place] [phone]

## 2019-05-03 DIAGNOSIS — B9561 Methicillin susceptible Staphylococcus aureus infection as the cause of diseases classified elsewhere: Secondary | ICD-10-CM | POA: Diagnosis not present

## 2019-05-03 DIAGNOSIS — J9612 Chronic respiratory failure with hypercapnia: Secondary | ICD-10-CM | POA: Diagnosis not present

## 2019-05-03 DIAGNOSIS — R7881 Bacteremia: Secondary | ICD-10-CM | POA: Diagnosis not present

## 2019-05-03 DIAGNOSIS — J449 Chronic obstructive pulmonary disease, unspecified: Secondary | ICD-10-CM | POA: Diagnosis not present

## 2019-05-05 ENCOUNTER — Encounter: Payer: Self-pay | Admitting: Pulmonary Disease

## 2019-05-05 ENCOUNTER — Ambulatory Visit (INDEPENDENT_AMBULATORY_CARE_PROVIDER_SITE_OTHER): Payer: Medicare HMO | Admitting: Pulmonary Disease

## 2019-05-05 ENCOUNTER — Other Ambulatory Visit: Payer: Self-pay

## 2019-05-05 DIAGNOSIS — J432 Centrilobular emphysema: Secondary | ICD-10-CM

## 2019-05-06 ENCOUNTER — Other Ambulatory Visit: Payer: Self-pay | Admitting: *Deleted

## 2019-05-06 DIAGNOSIS — J449 Chronic obstructive pulmonary disease, unspecified: Secondary | ICD-10-CM | POA: Diagnosis not present

## 2019-05-06 DIAGNOSIS — R911 Solitary pulmonary nodule: Secondary | ICD-10-CM | POA: Diagnosis not present

## 2019-05-06 DIAGNOSIS — J9611 Chronic respiratory failure with hypoxia: Secondary | ICD-10-CM | POA: Diagnosis not present

## 2019-05-06 DIAGNOSIS — R300 Dysuria: Secondary | ICD-10-CM | POA: Diagnosis not present

## 2019-05-06 NOTE — Progress Notes (Signed)
Followed by Dr. Elsworth Soho.  Visit rescheduled with Dr. Elsworth Soho.

## 2019-05-06 NOTE — Patient Outreach (Signed)
[facility]) Care Management  05/06/2019  Jasmine [name] 16-Sep-1946 [id number]   Call placed to member to follow up on COPD management.  State she has received new machine and using it daily.  "It's been much easier to operate."  Denies any shortness of breath at this time but state she has a call out to pulmonary office to schedule follow up visit.  Waiting to see if it will be scheduled face to face or virtual.  Report vitals are "all good."  Blood pressure range 100s/60s, weight range 117-120 pounds, and oxygen levels 90-95%.    Report concern today of flooring from her attic falling through to her closet.  She have insulation laying in the area, unsure if this may be a concern for her COPD management (concerned that it may contain asbestos).  She is looking to get it repaired but may not be able to afford.  Open to community resources from Education officer, museum for options.  Denies any urgent concerns at this time, will follow up within the next month.  If remain stable will consider transition to health coach.   THN CM Care Plan Problem One     Most Recent Value  Care Plan Problem One  Risk for readmission related to COPD management as evidenced by recent hospital admission  Role Documenting the Problem One  Care Management [place] for Problem One  Active  [facility] Long Term Goal   Member will not be readmitted to hospital within the next 31 days  THN Long Term Goal Start Date  03/11/19  [facility] Long Term Goal Met Date  04/18/19  [place] CM Short Term Goal #1   Member will attend follow up appointment with primary MD and pulmonologist within the next 2 weeks  THN CM Short Term Goal #1 Start Date  05/06/19  Interventions for Short Term Goal #1  Advised of close follow up with pulmonary MD office, advised to follow up for virtual or face to face visit  THN CM Short Term Goal #3  Member will have be able to verbalize plan for new machine within the next 2 weeks  THN CM Short  Term Goal #3 Start Date  04/18/19  [facility] CM Short Term Goal #3 Met Date  05/06/19      Valente David, RN, MSN [place] Manager [phone]

## 2019-05-10 ENCOUNTER — Other Ambulatory Visit: Payer: Self-pay

## 2019-05-10 NOTE — Patient Outreach (Signed)
[facility]) Care Management  05/10/2019  Jasmine [name] [birth year] [id number]   Social work referral received from [facility], [employer].  "Patient requesting resources for home modifications. Flooring in attic fell through into her closet. Unsure if insulation may cause breathing problems, looking to get fixed as soon as possible but looking for affordable options as finances are a concern." Successful outreach to patient today.  Patient has contacted the local Health Department but was told they can only assist with businesses.  BSW and patient discussed Geographical information systems officer through [employer].  BSW provided her with contact information for [facility] and she stated that she would call to further discuss eligibility and the application process.  BSW will follow up with patient before the end of the week to ensure she was able to connect with someone at [facility] and to determine if she needs further assistance with application process.  Ronn Melena, BSW Social Worker [phone]

## 2019-05-11 ENCOUNTER — Other Ambulatory Visit: Payer: Self-pay | Admitting: Internal Medicine

## 2019-05-13 ENCOUNTER — Other Ambulatory Visit: Payer: Self-pay

## 2019-05-13 NOTE — Patient Outreach (Signed)
[place] [facility]) Care Management  05/13/2019  Jasmine [name] [birth year] [id number]   Successful follow up call to patient regarding social work referral for assistance with repair of ceiling at her home. Patient stated that she contacted housing services at [facility] but had to leave a voicemail message and has not heard back.  BSW agreed to attempt to connect with someone at [facility] as well.  Left message in general voicemail box.  Will attempt to reach again next week if no return call.  Ronn Melena, BSW Social Worker [phone]

## 2019-05-17 ENCOUNTER — Ambulatory Visit: Payer: Self-pay

## 2019-05-17 ENCOUNTER — Other Ambulatory Visit: Payer: Self-pay

## 2019-05-18 ENCOUNTER — Other Ambulatory Visit: Payer: Self-pay

## 2019-05-18 NOTE — Patient Outreach (Signed)
[facility]) Care Management  05/18/2019  Jasmine [name] 05-04-1946 [id number]    Successful follow up call to patient regarding social work referral for assistance with repair of ceiling at her home.  BSW contacted Housing Services with [facility] but patient is not eligible due to being a resident of [place] [facility] limits.  BSW contacted [facility] through the [place] to inquire about services. Patient may be eligible for a low interest loan through the city to assist with home repairs.  BSW messaged Ellison Hughs Tharrington with [facility] to determine if this is an appropriate referral for them. Will follow up with patient when response is received.   BSW and patient discussed above information today.  Patient requested that application for loan through city be mailed to her.  Will follow up with patient within the next two weeks to ensure receipt of application.   Ronn Melena, BSW Social Worker [phone]

## 2019-05-25 ENCOUNTER — Other Ambulatory Visit: Payer: Self-pay

## 2019-05-25 ENCOUNTER — Ambulatory Visit: Payer: Medicare HMO | Admitting: Pulmonary Disease

## 2019-05-26 DIAGNOSIS — J449 Chronic obstructive pulmonary disease, unspecified: Secondary | ICD-10-CM | POA: Diagnosis not present

## 2019-05-29 DIAGNOSIS — J9612 Chronic respiratory failure with hypercapnia: Secondary | ICD-10-CM | POA: Diagnosis not present

## 2019-05-29 DIAGNOSIS — B9561 Methicillin susceptible Staphylococcus aureus infection as the cause of diseases classified elsewhere: Secondary | ICD-10-CM | POA: Diagnosis not present

## 2019-05-29 DIAGNOSIS — R7881 Bacteremia: Secondary | ICD-10-CM | POA: Diagnosis not present

## 2019-05-29 DIAGNOSIS — J449 Chronic obstructive pulmonary disease, unspecified: Secondary | ICD-10-CM | POA: Diagnosis not present

## 2019-06-01 ENCOUNTER — Other Ambulatory Visit: Payer: Self-pay

## 2019-06-01 NOTE — Patient Outreach (Signed)
[facility]) Care Management  06/01/2019  IAN CAVEY [birth year] [id number]    Successful follow up call to patient regarding social work referral for assistance with repair of ceiling at her home. Patient confirmed receipt of application for Housing Rehabilitation Program through the [place].   Patient reported that, since our last conversation, a Sherilyn Banker from her church that supervises a [place] came over to assess the damage.  She was told that asbestos should not be a concern.  Patient reports that he is going to repair the damage next week.  Patient will be charged for the work but she confirmed that she is able to afford the cost.  BSW is closing case at this time but did encourage her to call if additional needs arise.  Ronn Melena, BSW Social Worker [phone]

## 2019-06-03 ENCOUNTER — Other Ambulatory Visit: Payer: Self-pay | Admitting: *Deleted

## 2019-06-03 DIAGNOSIS — J449 Chronic obstructive pulmonary disease, unspecified: Secondary | ICD-10-CM | POA: Diagnosis not present

## 2019-06-03 DIAGNOSIS — R7881 Bacteremia: Secondary | ICD-10-CM | POA: Diagnosis not present

## 2019-06-03 DIAGNOSIS — B9561 Methicillin susceptible Staphylococcus aureus infection as the cause of diseases classified elsewhere: Secondary | ICD-10-CM | POA: Diagnosis not present

## 2019-06-03 DIAGNOSIS — J9612 Chronic respiratory failure with hypercapnia: Secondary | ICD-10-CM | POA: Diagnosis not present

## 2019-06-03 NOTE — Patient Outreach (Signed)
[place] [facility]) Care Management  06/03/2019  Jasmine [name] January 05, 1946 [id number]   Call placed to member to follow up on current health condition.  She report she is doing "pretty good."  Denies any shortness of breath or chest discomfort at this time, denies cough.  Confirms she has been using her CPAP machine much better than her previous non-invasive vent, continues to wear her oxygen daily but admits she continues to smoke. She will have her ceiling repaired next week, no further concern for asbestos as home was evaluated and no thereat was found.  Recently have tele-visits by pulmonology and primary MD, still not comfortable going into the office but she will follow up with both MD's and schedule follow up tele-visits.  Assessed need for ongoing complex case management, denies.  She agrees to transition back to Engineer, maintenance.  Will place referral to health coach and notify primary MD of transition.    [facility] CM Care Plan Problem One     Most Recent Value  Care Plan Problem One  Risk for readmission related to COPD management as evidenced by recent hospital admission  Role Documenting the Problem One  Care Management [place] for Problem One  Not Active  [facility] Long Term Goal   Member will not be readmitted to hospital within the next 31 days  [facility] Long Term Goal Start Date  03/11/19  [facility] Long Term Goal Met Date  04/18/19  [facility] CM Short Term Goal #1   Member will attend follow up appointment with primary MD and pulmonologist within the next 2 weeks  [facility] CM Short Term Goal #1 Start Date  05/06/19  [facility] CM Short Term Goal #1 Met Date  06/03/19  [facility] CM Short Term Goal #3  Member will have be able to verbalize plan for new machine within the next 2 weeks  [facility] CM Short Term Goal #3 Start Date  04/18/19  [facility] CM Short Term Goal #3 Met Date  05/06/19     Valente David, RN, MSN [place] [phone]

## 2019-06-06 DIAGNOSIS — R300 Dysuria: Secondary | ICD-10-CM | POA: Diagnosis not present

## 2019-06-06 DIAGNOSIS — J449 Chronic obstructive pulmonary disease, unspecified: Secondary | ICD-10-CM | POA: Diagnosis not present

## 2019-06-06 DIAGNOSIS — R911 Solitary pulmonary nodule: Secondary | ICD-10-CM | POA: Diagnosis not present

## 2019-06-06 DIAGNOSIS — J9611 Chronic respiratory failure with hypoxia: Secondary | ICD-10-CM | POA: Diagnosis not present

## 2019-06-26 DIAGNOSIS — J449 Chronic obstructive pulmonary disease, unspecified: Secondary | ICD-10-CM | POA: Diagnosis not present

## 2019-06-29 DIAGNOSIS — B9561 Methicillin susceptible Staphylococcus aureus infection as the cause of diseases classified elsewhere: Secondary | ICD-10-CM | POA: Diagnosis not present

## 2019-06-29 DIAGNOSIS — J9612 Chronic respiratory failure with hypercapnia: Secondary | ICD-10-CM | POA: Diagnosis not present

## 2019-06-29 DIAGNOSIS — J449 Chronic obstructive pulmonary disease, unspecified: Secondary | ICD-10-CM | POA: Diagnosis not present

## 2019-06-29 DIAGNOSIS — R7881 Bacteremia: Secondary | ICD-10-CM | POA: Diagnosis not present

## 2019-07-04 DIAGNOSIS — R7881 Bacteremia: Secondary | ICD-10-CM | POA: Diagnosis not present

## 2019-07-04 DIAGNOSIS — J9612 Chronic respiratory failure with hypercapnia: Secondary | ICD-10-CM | POA: Diagnosis not present

## 2019-07-04 DIAGNOSIS — B9561 Methicillin susceptible Staphylococcus aureus infection as the cause of diseases classified elsewhere: Secondary | ICD-10-CM | POA: Diagnosis not present

## 2019-07-04 DIAGNOSIS — J449 Chronic obstructive pulmonary disease, unspecified: Secondary | ICD-10-CM | POA: Diagnosis not present

## 2019-07-07 ENCOUNTER — Telehealth: Payer: Self-pay | Admitting: Pulmonary Disease

## 2019-07-07 DIAGNOSIS — J449 Chronic obstructive pulmonary disease, unspecified: Secondary | ICD-10-CM | POA: Diagnosis not present

## 2019-07-07 DIAGNOSIS — R300 Dysuria: Secondary | ICD-10-CM | POA: Diagnosis not present

## 2019-07-07 DIAGNOSIS — R911 Solitary pulmonary nodule: Secondary | ICD-10-CM | POA: Diagnosis not present

## 2019-07-07 DIAGNOSIS — J9611 Chronic respiratory failure with hypoxia: Secondary | ICD-10-CM | POA: Diagnosis not present

## 2019-07-07 NOTE — Telephone Encounter (Signed)
ATC pt. VM box is full. WCB.  

## 2019-07-08 ENCOUNTER — Other Ambulatory Visit: Payer: Self-pay | Admitting: Internal Medicine

## 2019-07-08 NOTE — Telephone Encounter (Signed)
Called spoke with patient. She states the box is from [facility] I told her she could call them and see what they sent her, or open it and see if it was supplies for her CPAP machine.   Patient voiced understanding, pt hung up on me before opening the box or stating what her plan was.

## 2019-07-08 NOTE — Telephone Encounter (Signed)
Called patient. She did not answer and VM was not full. Will attempt to call back later.

## 2019-07-08 NOTE — Telephone Encounter (Signed)
Pt returned call. Would like call back

## 2019-07-11 NOTE — Addendum Note (Signed)
Addended byValente David on: 07/11/2019 03:18 PM   Modules accepted: Orders

## 2019-07-14 DIAGNOSIS — B9561 Methicillin susceptible Staphylococcus aureus infection as the cause of diseases classified elsewhere: Secondary | ICD-10-CM | POA: Diagnosis not present

## 2019-07-14 DIAGNOSIS — J9612 Chronic respiratory failure with hypercapnia: Secondary | ICD-10-CM | POA: Diagnosis not present

## 2019-07-14 DIAGNOSIS — J449 Chronic obstructive pulmonary disease, unspecified: Secondary | ICD-10-CM | POA: Diagnosis not present

## 2019-07-14 DIAGNOSIS — R7881 Bacteremia: Secondary | ICD-10-CM | POA: Diagnosis not present

## 2019-07-15 ENCOUNTER — Other Ambulatory Visit: Payer: Self-pay | Admitting: *Deleted

## 2019-07-26 DIAGNOSIS — J449 Chronic obstructive pulmonary disease, unspecified: Secondary | ICD-10-CM | POA: Diagnosis not present

## 2019-07-28 ENCOUNTER — Other Ambulatory Visit: Payer: Self-pay

## 2019-07-28 NOTE — Patient Outreach (Signed)
[place] [facility]) Care Management  07/28/2019  Jasmine [name] [birth year] [id number]   Telephone call to patient for initial disease management assessment.  No answer.  Unable to leave a message.    Plan: RN CM will attempt next month and send letter.    [name], RN, MSN [place] Management Care Management Coordinator Direct Line [phone] Cell [phone] Toll Free: [phone]  Fax: [phone]

## 2019-07-29 ENCOUNTER — Other Ambulatory Visit: Payer: Self-pay

## 2019-07-29 NOTE — Patient Outreach (Signed)
[place] [facility]) Care Management  [place]  07/29/2019   Jasmine [name] Mar 13, 1946 [id number]  Subjective: Telephone call patient.  She reports she is doing good.  She reports that her breathing is about the same.  She continues to use her oxygen.  Discussed COPD regimen and encouraged medication compliance.  Educated patient on getting flu vaccine. She will get one from her local pharmacy per patient.  Patient denies any problems at this time.    Objective:   Encounter Medications:  Outpatient Encounter Medications as of 07/29/2019  Medication Sig Note  . albuterol (PROVENTIL) (2.5 MG/3ML) 0.083% nebulizer solution Take 3 mLs (2.5 mg total) by nebulization every 6 (six) hours as needed for wheezing or shortness of breath. 03/07/2019: LF 3.3.20 1 box  . aspirin EC 81 MG EC tablet Take 1 tablet (81 mg total) by mouth daily.   [name] atorvastatin (LIPITOR) 10 MG tablet Take 1 tablet (10 mg total) by mouth daily. 03/07/2019: LF 3.16.20 90DS  . bisacodyl (DULCOLAX) 5 MG EC tablet Take 5 mg by mouth daily as needed for moderate constipation.   . citalopram (CELEXA) 20 MG tablet TAKE 1 TABLET (20 MG TOTAL) BY MOUTH DAILY.   . fluticasone furoate-vilanterol (BREO ELLIPTA) 200-25 MCG/INH AEPB Inhale 1 puff into the lungs daily.   . furosemide (LASIX) 40 MG tablet 1 tab by mouth in the AM daily, with 1 tab in the PM as needed for persistent swelling (Patient taking differently: Take 40 mg by mouth daily. )   . levothyroxine (SYNTHROID) 75 MCG tablet TAKE 1 TABLET BY MOUTH DAILY BEFORE BREAKFAST.   [name] OXYGEN Inhale 3 L into the lungs. 24/7   . potassium chloride (K-DUR) 10 MEQ tablet TAKE 1 TABLET BY MOUTH 2 TIMES DAILY.   [name] PRESCRIPTION MEDICATION Bipap   . SPIRIVA HANDIHALER 18 MCG inhalation capsule PLACE 1 CAPSULE (18 MCG TOTAL) INTO INHALER AND INHALE DAILY. (Patient taking differently: Place 18 mcg into inhaler and inhale daily. ) 03/07/2019: LF 3.16.20 90DS  . traMADol (ULTRAM)  50 MG tablet Take 1 tablet (50 mg total) by mouth 2 (two) times daily as needed.   . VENTOLIN HFA 108 (90 Base) MCG/ACT inhaler INHALE 1 TO 2 PUFFS INTO THE LUNGS EVERY 6 (SIX) HOURS AS NEEDED FOR WHEEZING OR SHORTNESS OF BREATH. (Patient taking differently: Inhale 1-2 puffs into the lungs every 6 (six) hours as needed for wheezing or shortness of breath. ) 03/07/2019: LF 2.25.20 28DS  . nicotine (NICODERM CQ - DOSED IN MG/24 HOURS) 21 mg/24hr patch Place 1 patch (21 mg total) onto the skin daily. (Patient not taking: Reported on 07/29/2019)    No facility-administered encounter medications on file as of 07/29/2019.     Functional Status:  In your present state of health, do you have any difficulty performing the following activities: 07/29/2019 03/28/2019  Hearing? N N  Vision? N N  Difficulty concentrating or making decisions? N N  Walking or climbing stairs? Y Y  Comment due to shortness of breath Due to breathing  Dressing or bathing? N N  Doing errands, shopping? N N  Preparing Food and eating ? N N  Using the Toilet? N N  In the past six months, have you accidently leaked urine? N Y  Do you have problems with loss of bowel control? N N  Managing your Medications? N N  Managing your Finances? N N  Housekeeping or managing your Housekeeping? N N  Some recent data might  be hidden    Fall/Depression Screening: Fall Risk  07/29/2019 03/28/2019 01/06/2019  Falls in the past year? 0 0 0  Number falls in past yr: 0 - 0  Comment - - -  Injury with Fall? - - 0  Risk Factor Category  - - -  Risk for fall due to : - - -  Follow up - - -   PHQ 2/9 Scores 07/29/2019 03/28/2019 11/29/2018 11/24/2018 09/20/2018 08/05/2018 06/16/2018  PHQ - 2 Score 0 1 3 3  0 0 0  PHQ- 9 Score - - - 8 - - -    Assessment: Patient managing COPD good per patient.    Plan:  [facility] CM Care Plan Problem One     Most Recent Value  Care Plan Problem One  Knowledge deficit related to COPD  Role Documenting the Problem One   Care Management Telephonic Coordinator  Care Plan for Problem One  Active  [facility] Long Term Goal   Patient will continue to follow COPD regimen and not have any excerbations within the next 90 days.  [facility] Long Term Goal Start Date  07/29/19  Interventions for Problem One Long Term Goal  Reviewed with patient current COPD regimen.  Discussed COPD signs of worsening and seeking medical assistance.     RN CM will send letter and calendar. RN CM will contact patient in the month of December and patient agrees.  RN CM will send update physician.     [name], RN, MSN [place] Management Care Management Coordinator Direct Line [phone] Cell [phone] Toll Free: [phone]  Fax: [phone]

## 2019-08-02 ENCOUNTER — Ambulatory Visit: Payer: Medicare HMO | Admitting: *Deleted

## 2019-08-02 ENCOUNTER — Ambulatory Visit: Payer: Medicare HMO

## 2019-08-03 DIAGNOSIS — B9561 Methicillin susceptible Staphylococcus aureus infection as the cause of diseases classified elsewhere: Secondary | ICD-10-CM | POA: Diagnosis not present

## 2019-08-03 DIAGNOSIS — J449 Chronic obstructive pulmonary disease, unspecified: Secondary | ICD-10-CM | POA: Diagnosis not present

## 2019-08-03 DIAGNOSIS — R7881 Bacteremia: Secondary | ICD-10-CM | POA: Diagnosis not present

## 2019-08-03 DIAGNOSIS — J9612 Chronic respiratory failure with hypercapnia: Secondary | ICD-10-CM | POA: Diagnosis not present

## 2019-08-06 DIAGNOSIS — J9611 Chronic respiratory failure with hypoxia: Secondary | ICD-10-CM | POA: Diagnosis not present

## 2019-08-06 DIAGNOSIS — R300 Dysuria: Secondary | ICD-10-CM | POA: Diagnosis not present

## 2019-08-06 DIAGNOSIS — R911 Solitary pulmonary nodule: Secondary | ICD-10-CM | POA: Diagnosis not present

## 2019-08-06 DIAGNOSIS — J449 Chronic obstructive pulmonary disease, unspecified: Secondary | ICD-10-CM | POA: Diagnosis not present

## 2019-08-14 ENCOUNTER — Inpatient Hospital Stay (HOSPITAL_COMMUNITY)
Admission: EM | Admit: 2019-08-14 | Discharge: 2019-08-19 | DRG: 190 | Disposition: A | Discharge status: Home / Self Care | Payer: Medicare HMO | Attending: Internal Medicine | Admitting: Internal Medicine

## 2019-08-14 ENCOUNTER — Other Ambulatory Visit: Payer: Self-pay

## 2019-08-14 ENCOUNTER — Encounter (HOSPITAL_COMMUNITY): Payer: Self-pay

## 2019-08-14 ENCOUNTER — Emergency Department (HOSPITAL_COMMUNITY): Payer: Medicare HMO

## 2019-08-14 DIAGNOSIS — Z23 Encounter for immunization: Secondary | ICD-10-CM | POA: Diagnosis not present

## 2019-08-14 DIAGNOSIS — R410 Disorientation, unspecified: Secondary | ICD-10-CM | POA: Diagnosis not present

## 2019-08-14 DIAGNOSIS — I2781 Cor pulmonale (chronic): Secondary | ICD-10-CM | POA: Diagnosis present

## 2019-08-14 DIAGNOSIS — J9601 Acute respiratory failure with hypoxia: Secondary | ICD-10-CM | POA: Diagnosis not present

## 2019-08-14 DIAGNOSIS — J9692 Respiratory failure, unspecified with hypercapnia: Secondary | ICD-10-CM

## 2019-08-14 DIAGNOSIS — Z20828 Contact with and (suspected) exposure to other viral communicable diseases: Secondary | ICD-10-CM | POA: Diagnosis not present

## 2019-08-14 DIAGNOSIS — M810 Age-related osteoporosis without current pathological fracture: Secondary | ICD-10-CM | POA: Diagnosis present

## 2019-08-14 DIAGNOSIS — I5032 Chronic diastolic (congestive) heart failure: Secondary | ICD-10-CM | POA: Diagnosis present

## 2019-08-14 DIAGNOSIS — J9621 Acute and chronic respiratory failure with hypoxia: Secondary | ICD-10-CM | POA: Diagnosis not present

## 2019-08-14 DIAGNOSIS — F1910 Other psychoactive substance abuse, uncomplicated: Secondary | ICD-10-CM | POA: Diagnosis present

## 2019-08-14 DIAGNOSIS — J449 Chronic obstructive pulmonary disease, unspecified: Secondary | ICD-10-CM | POA: Diagnosis not present

## 2019-08-14 DIAGNOSIS — I110 Hypertensive heart disease with heart failure: Secondary | ICD-10-CM | POA: Diagnosis present

## 2019-08-14 DIAGNOSIS — Z8349 Family history of other endocrine, nutritional and metabolic diseases: Secondary | ICD-10-CM

## 2019-08-14 DIAGNOSIS — E872 Acidosis: Secondary | ICD-10-CM | POA: Diagnosis present

## 2019-08-14 DIAGNOSIS — Z87891 Personal history of nicotine dependence: Secondary | ICD-10-CM | POA: Diagnosis present

## 2019-08-14 DIAGNOSIS — J309 Allergic rhinitis, unspecified: Secondary | ICD-10-CM | POA: Diagnosis present

## 2019-08-14 DIAGNOSIS — Z90722 Acquired absence of ovaries, bilateral: Secondary | ICD-10-CM

## 2019-08-14 DIAGNOSIS — Z888 Allergy status to other drugs, medicaments and biological substances status: Secondary | ICD-10-CM

## 2019-08-14 DIAGNOSIS — J441 Chronic obstructive pulmonary disease with (acute) exacerbation: Secondary | ICD-10-CM | POA: Diagnosis not present

## 2019-08-14 DIAGNOSIS — J9622 Acute and chronic respiratory failure with hypercapnia: Secondary | ICD-10-CM | POA: Diagnosis present

## 2019-08-14 DIAGNOSIS — Z66 Do not resuscitate: Secondary | ICD-10-CM | POA: Diagnosis present

## 2019-08-14 DIAGNOSIS — Z79899 Other long term (current) drug therapy: Secondary | ICD-10-CM

## 2019-08-14 DIAGNOSIS — Z7982 Long term (current) use of aspirin: Secondary | ICD-10-CM

## 2019-08-14 DIAGNOSIS — Z85118 Personal history of other malignant neoplasm of bronchus and lung: Secondary | ICD-10-CM | POA: Diagnosis not present

## 2019-08-14 DIAGNOSIS — Z825 Family history of asthma and other chronic lower respiratory diseases: Secondary | ICD-10-CM

## 2019-08-14 DIAGNOSIS — R062 Wheezing: Secondary | ICD-10-CM | POA: Diagnosis not present

## 2019-08-14 DIAGNOSIS — Z8249 Family history of ischemic heart disease and other diseases of the circulatory system: Secondary | ICD-10-CM

## 2019-08-14 DIAGNOSIS — E440 Moderate protein-calorie malnutrition: Secondary | ICD-10-CM | POA: Diagnosis not present

## 2019-08-14 DIAGNOSIS — R531 Weakness: Secondary | ICD-10-CM | POA: Diagnosis not present

## 2019-08-14 DIAGNOSIS — Z9981 Dependence on supplemental oxygen: Secondary | ICD-10-CM | POA: Diagnosis not present

## 2019-08-14 DIAGNOSIS — F419 Anxiety disorder, unspecified: Secondary | ICD-10-CM | POA: Diagnosis present

## 2019-08-14 DIAGNOSIS — R069 Unspecified abnormalities of breathing: Secondary | ICD-10-CM | POA: Diagnosis not present

## 2019-08-14 DIAGNOSIS — E8729 Other acidosis: Secondary | ICD-10-CM | POA: Diagnosis present

## 2019-08-14 DIAGNOSIS — F1999 Other psychoactive substance use, unspecified with unspecified psychoactive substance-induced disorder: Secondary | ICD-10-CM | POA: Diagnosis not present

## 2019-08-14 DIAGNOSIS — F329 Major depressive disorder, single episode, unspecified: Secondary | ICD-10-CM | POA: Diagnosis present

## 2019-08-14 DIAGNOSIS — Z7289 Other problems related to lifestyle: Secondary | ICD-10-CM

## 2019-08-14 DIAGNOSIS — H9193 Unspecified hearing loss, bilateral: Secondary | ICD-10-CM | POA: Diagnosis present

## 2019-08-14 DIAGNOSIS — R0602 Shortness of breath: Secondary | ICD-10-CM | POA: Diagnosis not present

## 2019-08-14 DIAGNOSIS — Z79890 Hormone replacement therapy (postmenopausal): Secondary | ICD-10-CM

## 2019-08-14 DIAGNOSIS — Z7951 Long term (current) use of inhaled steroids: Secondary | ICD-10-CM

## 2019-08-14 DIAGNOSIS — E785 Hyperlipidemia, unspecified: Secondary | ICD-10-CM | POA: Diagnosis present

## 2019-08-14 DIAGNOSIS — Z6822 Body mass index (BMI) 22.0-22.9, adult: Secondary | ICD-10-CM

## 2019-08-14 DIAGNOSIS — Z79891 Long term (current) use of opiate analgesic: Secondary | ICD-10-CM

## 2019-08-14 DIAGNOSIS — J329 Chronic sinusitis, unspecified: Secondary | ICD-10-CM | POA: Diagnosis present

## 2019-08-14 DIAGNOSIS — J9602 Acute respiratory failure with hypercapnia: Secondary | ICD-10-CM | POA: Diagnosis not present

## 2019-08-14 DIAGNOSIS — E039 Hypothyroidism, unspecified: Secondary | ICD-10-CM | POA: Diagnosis present

## 2019-08-14 DIAGNOSIS — Z9119 Patient's noncompliance with other medical treatment and regimen: Secondary | ICD-10-CM

## 2019-08-14 DIAGNOSIS — Z86718 Personal history of other venous thrombosis and embolism: Secondary | ICD-10-CM

## 2019-08-14 DIAGNOSIS — R64 Cachexia: Secondary | ICD-10-CM | POA: Diagnosis present

## 2019-08-14 DIAGNOSIS — Z801 Family history of malignant neoplasm of trachea, bronchus and lung: Secondary | ICD-10-CM

## 2019-08-14 DIAGNOSIS — J432 Centrilobular emphysema: Secondary | ICD-10-CM | POA: Diagnosis not present

## 2019-08-14 DIAGNOSIS — R4182 Altered mental status, unspecified: Secondary | ICD-10-CM | POA: Diagnosis not present

## 2019-08-14 DIAGNOSIS — Z720 Tobacco use: Secondary | ICD-10-CM

## 2019-08-14 LAB — CBC WITH DIFFERENTIAL/PLATELET
Abs Immature Granulocytes: 0.02 10*3/uL (ref 0.00–0.07)
Basophils Absolute: 0 10*3/uL (ref 0.0–0.1)
Basophils Relative: 1 %
Eosinophils Absolute: 0 10*3/uL (ref 0.0–0.5)
Eosinophils Relative: 0 %
HCT: 52.3 % — ABNORMAL HIGH (ref 36.0–46.0)
Hemoglobin: 15.5 g/dL — ABNORMAL HIGH (ref 12.0–15.0)
Immature Granulocytes: 0 %
Lymphocytes Relative: 13 %
Lymphs Abs: 0.6 10*3/uL — ABNORMAL LOW (ref 0.7–4.0)
MCH: 31.6 pg (ref 26.0–34.0)
MCHC: 29.6 g/dL — ABNORMAL LOW (ref 30.0–36.0)
MCV: 106.5 fL — ABNORMAL HIGH (ref 80.0–100.0)
Monocytes Absolute: 0.3 10*3/uL (ref 0.1–1.0)
Monocytes Relative: 6 %
Neutro Abs: 3.6 10*3/uL (ref 1.7–7.7)
Neutrophils Relative %: 80 %
Platelets: 125 10*3/uL — ABNORMAL LOW (ref 150–400)
RBC: 4.91 MIL/uL (ref 3.87–5.11)
RDW: 13.7 % (ref 11.5–15.5)
WBC: 4.5 10*3/uL (ref 4.0–10.5)
nRBC: 0 % (ref 0.0–0.2)

## 2019-08-14 LAB — HEPATIC FUNCTION PANEL
ALT: 17 U/L (ref 0–44)
AST: 26 U/L (ref 15–41)
Albumin: 4.4 g/dL (ref 3.5–5.0)
Alkaline Phosphatase: 54 U/L (ref 38–126)
Bilirubin, Direct: 0.1 mg/dL (ref 0.0–0.2)
Indirect Bilirubin: 0.7 mg/dL (ref 0.3–0.9)
Total Bilirubin: 0.8 mg/dL (ref 0.3–1.2)
Total Protein: 8.1 g/dL (ref 6.5–8.1)

## 2019-08-14 LAB — BASIC METABOLIC PANEL
Anion gap: 14 (ref 5–15)
BUN: 10 mg/dL (ref 8–23)
CO2: 41 mmol/L — ABNORMAL HIGH (ref 22–32)
Calcium: 9.7 mg/dL (ref 8.9–10.3)
Chloride: 84 mmol/L — ABNORMAL LOW (ref 98–111)
Creatinine, Ser: 0.66 mg/dL (ref 0.44–1.00)
GFR calc Af Amer: >60 mL/min (ref >60)
GFR calc non Af Amer: >60 mL/min (ref >60)
Glucose, Bld: 135 mg/dL — ABNORMAL HIGH (ref 70–99)
Potassium: 4.8 mmol/L (ref 3.5–5.1)
Sodium: 139 mmol/L (ref 135–145)

## 2019-08-14 LAB — BRAIN NATRIURETIC PEPTIDE: B Natriuretic Peptide: 30.8 pg/mL (ref 0.0–100.0)

## 2019-08-14 LAB — SARS CORONAVIRUS 2 BY RT PCR (HOSPITAL ORDER, PERFORMED IN ~~LOC~~ HOSPITAL LAB): SARS Coronavirus 2: NEGATIVE

## 2019-08-14 LAB — TROPONIN I (HIGH SENSITIVITY): Troponin I (High Sensitivity): 6 ng/L (ref <18)

## 2019-08-14 MED ORDER — ENOXAPARIN SODIUM 40 MG/0.4ML ~~LOC~~ SOLN
40.0000 mg | SUBCUTANEOUS | Status: DC
  Administered 2019-08-14 – 2019-08-18 (×5): 40 mg via SUBCUTANEOUS
  Filled 2019-08-14 (×7): qty 0.4

## 2019-08-14 MED ORDER — TIOTROPIUM BROMIDE MONOHYDRATE 18 MCG IN CAPS: 18.0000 ug | ORAL_CAPSULE | Freq: Every day | RESPIRATORY_TRACT | Status: DC

## 2019-08-14 MED ORDER — FLUTICASONE FUROATE-VILANTEROL 200-25 MCG/INH IN AEPB
1.0000 | INHALATION_SPRAY | Freq: Every day | RESPIRATORY_TRACT | Status: DC
  Administered 2019-08-15 – 2019-08-19 (×5): 1 via RESPIRATORY_TRACT
  Filled 2019-08-14: qty 28

## 2019-08-14 MED ORDER — ALBUTEROL SULFATE HFA 108 (90 BASE) MCG/ACT IN AERS
8.0000 | INHALATION_SPRAY | Freq: Once | RESPIRATORY_TRACT | Status: AC
  Administered 2019-08-14: 8 via RESPIRATORY_TRACT
  Filled 2019-08-14: qty 6.7

## 2019-08-14 MED ORDER — IPRATROPIUM-ALBUTEROL 0.5-2.5 (3) MG/3ML IN SOLN
3.0000 mL | RESPIRATORY_TRACT | Status: DC
  Administered 2019-08-14 – 2019-08-15 (×5): 3 mL via RESPIRATORY_TRACT
  Filled 2019-08-14 (×3): qty 3

## 2019-08-14 MED ORDER — IPRATROPIUM BROMIDE 0.02 % IN SOLN: 2.5000 mL | Freq: Once | RESPIRATORY_TRACT | Status: DC

## 2019-08-14 MED ORDER — MOMETASONE FURO-FORMOTEROL FUM 100-5 MCG/ACT IN AERO
2.0000 | INHALATION_SPRAY | Freq: Two times a day (BID) | RESPIRATORY_TRACT | Status: DC
  Filled 2019-08-14: qty 8.8

## 2019-08-14 MED ORDER — SODIUM CHLORIDE 0.9 % IV BOLUS
500.0000 mL | Freq: Once | INTRAVENOUS | Status: AC
  Administered 2019-08-14: 500 mL via INTRAVENOUS

## 2019-08-14 MED ORDER — IPRATROPIUM BROMIDE HFA 17 MCG/ACT IN AERS
2.0000 | INHALATION_SPRAY | Freq: Once | RESPIRATORY_TRACT | Status: DC
  Filled 2019-08-14: qty 12.9

## 2019-08-14 MED ORDER — HALOPERIDOL LACTATE 5 MG/ML IJ SOLN
2.0000 mg | Freq: Once | INTRAMUSCULAR | Status: AC
  Administered 2019-08-14: 2 mg via INTRAVENOUS
  Filled 2019-08-14: qty 1

## 2019-08-14 MED ORDER — ASPIRIN EC 81 MG PO TBEC
81.0000 mg | DELAYED_RELEASE_TABLET | Freq: Every day | ORAL | Status: DC
  Administered 2019-08-15 – 2019-08-19 (×5): 81 mg via ORAL
  Filled 2019-08-14 (×6): qty 1

## 2019-08-14 MED ORDER — LEVOTHYROXINE SODIUM 75 MCG PO TABS
75.0000 ug | ORAL_TABLET | Freq: Every day | ORAL | Status: DC
  Administered 2019-08-15 – 2019-08-19 (×5): 75 ug via ORAL
  Filled 2019-08-14 (×5): qty 1

## 2019-08-14 MED ORDER — METHYLPREDNISOLONE SODIUM SUCC 40 MG IJ SOLR
40.0000 mg | Freq: Four times a day (QID) | INTRAMUSCULAR | Status: DC
  Administered 2019-08-14 – 2019-08-16 (×8): 40 mg via INTRAVENOUS
  Filled 2019-08-14 (×8): qty 1

## 2019-08-14 MED ORDER — LEVOFLOXACIN IN D5W 750 MG/150ML IV SOLN
750.0000 mg | INTRAVENOUS | Status: DC
  Administered 2019-08-14 – 2019-08-15 (×2): 750 mg via INTRAVENOUS
  Filled 2019-08-14 (×2): qty 150

## 2019-08-14 MED ORDER — UMECLIDINIUM BROMIDE 62.5 MCG/INH IN AEPB
1.0000 | INHALATION_SPRAY | Freq: Every day | RESPIRATORY_TRACT | Status: DC
  Administered 2019-08-15 – 2019-08-19 (×5): 1 via RESPIRATORY_TRACT
  Filled 2019-08-14: qty 7

## 2019-08-14 NOTE — ED Notes (Signed)
Please call pt's niece Willene Hatchet POA at [phone] to give her an update on her aunt. She hasn't received an update since the pt arrived.

## 2019-08-14 NOTE — H&P (Signed)
History and Physical  Jasmine [name] [id number] DOB: [birth year] DOA: 08/14/2019  PCP: Biagio Borg, MD  Chief Complaint: Lethargy HPI:  58 community dwelling black female COPD 3 L and supposed to be on trilogy vent-On 18/6 full facemask 35% FiO2 at home, diastolic heart failure cor pulmonale last echo 12/17/2018 EF 65-70% depression on Celexa hypothyroidism, ?  Bilateral lung masses in the past in 2017?  Crack cocaine use  History severely limited patient is somewhat confused she is unable to tell much history husband is not available at this time-patient apparently has been lethargic somnolent for the past several days and has not been able to do her usual ADLs, stopped taking medications seen by EMS found to be slightly hypoxic  Chart review: . Last admitted 02/2019 hypoxic respiratory failure multiple other episodes in the past year  ED Course: Saline bolus given 8 puffs albuterol given coronavirus tested and negative therefore started on BiPAP and ED I cannot see the report but reportedly CO2 was 90 urine analysis obtained urine culture obtained Added on urine tox screen   Review of Systems:  Cannot reliably obtain per nursing she was quite incoherent earlier only knew her name could not tell where she was that she is now talking more but still not making much sense  Past Medical History:  Diagnosis Date  . ALLERGIC RHINITIS 05/01/2007  . Anxiety   . Arthritis    "hands" (12/28/2015)  . ASTHMA 05/01/2007   "since I was a child"  . Cancer (HCC)    LUNG  . CHF (congestive heart failure) ([place])   . Chronic bronchitis ([place])   . COPD (chronic obstructive pulmonary disease) ([place])   . DEPRESSION 07/21/2008  . DVT (deep venous thrombosis) (HCC) 1960s   LLE  . FATIGUE 10/10/2010  . HYPERTENSION 05/01/2007  . HYPERTHYROIDISM 11/23/2007   Pt endorses having had Graves disease, possibly radioactive iodine x1, but no thyroidectomy  . HYPOTHYROIDISM 08/23/2009  . LUMBAR RADICULOPATHY,  RIGHT 08/25/2008  . On home oxygen therapy    "3-4L; qd; all the time" (12/28/2015)  . OSTEOPOROSIS 05/01/2007  . SHOULDER PAIN, LEFT 08/23/2009  . SINUSITIS, CHRONIC 10/10/2010    Past Surgical History:  Procedure Laterality Date  . APPENDECTOMY    . DILATION AND CURETTAGE OF UTERUS  multiple   history of multiple dialations and curettages and miscarriages, unfortunately never carrying a child to term  . ECTOPIC PREGNANCY SURGERY  "late '60s or early '70s  . ELECTROCARDIOGRAM  06/20/2006  . OOPHORECTOMY Right      reports that she quit smoking about 3 years ago. Her smoking use included cigarettes. She started smoking about 55 years ago. She has a 52.00 pack-year smoking history. She has never used smokeless tobacco. She reports current alcohol use of about 2.0 standard drinks of alcohol per week. She reports previous drug use. Mobility: ADLs intact  Allergies  Allergen Reactions  . Alendronate Sodium Other (See Comments)    Patient does not remember this reaction    Family History  Problem Relation Age of Onset  . Lung cancer Father   . Alcohol abuse Brother   . Diabetes Brother   . Hypertension Other   . Stroke Mother   . Thyroid disease Mother   . Asthma Other        maternal aunts  . Breast cancer Neg Hx      Prior to Admission medications   Medication Sig Start Date End Date Taking? Authorizing Provider  albuterol (  PROVENTIL) (2.5 MG/3ML) 0.083% nebulizer solution Take 3 mLs (2.5 mg total) by nebulization every 6 (six) hours as needed for wheezing or shortness of breath. 12/21/18   Arrien, Jimmy Picket, MD  aspirin EC 81 MG EC tablet Take 1 tablet (81 mg total) by mouth daily. 03/27/16   Velvet Bathe, MD  atorvastatin (LIPITOR) 10 MG tablet Take 1 tablet (10 mg total) by mouth daily. 09/20/18 09/20/19  Biagio Borg, MD  bisacodyl (DULCOLAX) 5 MG EC tablet Take 5 mg by mouth daily as needed for moderate constipation.    [provider]  citalopram (CELEXA) 20 MG  tablet TAKE 1 TABLET (20 MG TOTAL) BY MOUTH DAILY. 07/08/19   Biagio Borg, MD  fluticasone furoate-vilanterol (BREO ELLIPTA) 200-25 MCG/INH AEPB Inhale 1 puff into the lungs daily. 05/28/18   Robbie Lis, MD  furosemide (LASIX) 40 MG tablet 1 tab by mouth in the AM daily, with 1 tab in the PM as needed for persistent swelling Patient taking differently: Take 40 mg by mouth daily.  06/16/18   Biagio Borg, MD  levothyroxine (SYNTHROID) 75 MCG tablet TAKE 1 TABLET BY MOUTH DAILY BEFORE BREAKFAST. 05/11/19   Biagio Borg, MD  nicotine (NICODERM CQ - DOSED IN MG/24 HOURS) 21 mg/24hr patch Place 1 patch (21 mg total) onto the skin daily. Patient not taking: Reported on 07/29/2019 03/10/19   Flora Lipps, MD  OXYGEN Inhale 3 L into the lungs. 24/7    [provider]  potassium chloride (K-DUR) 10 MEQ tablet TAKE 1 TABLET BY MOUTH 2 TIMES DAILY. 03/22/19   Biagio Borg, MD  [place]    [provider]  SPIRIVA HANDIHALER 18 MCG inhalation capsule PLACE 1 CAPSULE (18 MCG TOTAL) INTO INHALER AND INHALE DAILY. Patient taking differently: Place 18 mcg into inhaler and inhale daily.  01/03/19   Rigoberto Noel, MD  traMADol (ULTRAM) 50 MG tablet Take 1 tablet (50 mg total) by mouth 2 (two) times daily as needed. 04/30/18   Biagio Borg, MD  VENTOLIN HFA 108 (90 Base) MCG/ACT inhaler INHALE 1 TO 2 PUFFS INTO THE LUNGS EVERY 6 (SIX) HOURS AS NEEDED FOR WHEEZING OR SHORTNESS OF BREATH. Patient taking differently: Inhale 1-2 puffs into the lungs every 6 (six) hours as needed for wheezing or shortness of breath.  12/22/18   Rigoberto Noel, MD    Physical Exam: Vitals:   08/14/19 1500 08/14/19 1515  BP: 106/60 115/66  Pulse: 81 83  Resp: 20 17  Temp:    SpO2: 100% 100%   Frail cachectic black female bitemporal wasting external ocular movements intact but somewhat sleepy falls back to sleep after questioning is on BiPAP patient was taken off to question Chest clinically  clear no added sound no rales no rhonchi S1-S2 no murmur rub or gallop Abdomen soft nontender nondistended no rebound no guarding No lower extremity edema ROM intact but patient not cooperative Mumbling but does not follow commands clearly    I have personally reviewed following labs and imaging studies  Labs:  CO2 41 chloride 84 BUN/creatinine 10/0.6 BNP 30.8 Troponin VI WBC 4.5 platelets 125  Imaging studies:   Platelike scarring versus atelectasis left base lung  Ethmoidal sinus thickening carotid artery calcification at one focus parenchyma normal no acute stroke no mass or hemorrhage  Medical tests:   EKG independently reviewed: Sinus, sinus tach PR interval 0.12 QRS axis about 80 degrees right atrial enlargement no ST-T wave  depressions  Principal Problem:   Type II respiratory failure (HCC) Active Problems:   Tobacco abuse disorder   On home oxygen therapy   Polysubstance abuse (HCC)   Respiratory acidosis   Assessment/Plan Acute superimposed on chronic respiratory failure type II with hypercarbia Severe COPD on home oxygen 3 L supposed to be on trilogy vent 18/6 at home Give Solu-Medrol 40 q. 6 Nebulized DuoNeb every 4, add Dulera every 12 resume Breo Ellipta when able Keep n.p.o. at this time given high risk sedation etc holding meds with sedative potential such as tramadol, citalopram until able to take Start levaquin for exacerbation Is DNR/DNI, going to SDU for PPV, am ABG Prior cocaine use Get stat drug screen HFpEF compensated not acute BNP not elevated holding Lasix for now Hyperlipidemia Continue atorvastatin 10 daily Prior adenocarcinoma lung? needs outpatient surveillance and follow-up     Severity of Illness: The appropriate patient status for this patient is INPATIENT. Inpatient status is judged to be reasonable and necessary in order to provide the required intensity of service to ensure the patient's safety. The patient's presenting  symptoms, physical exam findings, and initial radiographic and laboratory data in the context of their chronic comorbidities is felt to place them at high risk for further clinical deterioration. Furthermore, it is not anticipated that the patient will be medically stable for discharge from the hospital within 2 midnights of admission. The following factors support the patient status of inpatient.   " The patient's presenting symptoms include sob and cofusion. " The worrisome physical exam findings include sob. " The initial radiographic and laboratory data are worrisome because of co2 90. " The chronic co-morbidities include copd.   * I certify that at the point of admission it is my clinical judgment that the patient will require inpatient hospital care spanning beyond 2 midnights from the point of admission due to high intensity of service, high risk for further deterioration and high frequency of surveillance required.*     DVT prophylaxis:loveneox Code Status: dnr  Family Communication: none Consults called: none    Time spent: 60 minutes  Verneita Griffes, MD [facility] 4:00 PM  08/14/2019, 4:00 PM

## 2019-08-14 NOTE — ED Notes (Signed)
[place] PA made aware of VBG results. Specimen needs to be recollected for full lab results. ED-Lab

## 2019-08-14 NOTE — ED Provider Notes (Addendum)
[facility] EMERGENCY DEPARTMENT Provider Note   CSN: [id number] Arrival date & time: 08/14/19  1310     History   Chief Complaint Chief Complaint  Patient presents with  . Altered Mental Status  . Weakness    HPI Jasmine [name] is a 73 y.o. female with medical history significant for chronic hypoxic respiratory failure, COPD on 3lpm of oxygen via [place] at home, diastolic heart failure who presents to the emergency department via EMS for 3 days of progressive lethargy and SOB.     HPI  Patient presents to ED via EMS for altered mental status, weakness, decreased appetite, and shortness of breath for the past 3 days.   Patient states no pain but is unable to answer questions pertaining to history.   Level 5 caveat due to altered mental status.   Collateral history from [name] husband:  Patient was then her normal state of health with chronic progressive cognitive decline until Friday when she stopped eating and taking her medications.  Patient lives at home with her husband with no home health or nursing aide.  Husband states that he is unsure if patient has even been using her scheduled inhalers.  Patient with increased lethargy and weakness Friday evening which continued to worsen on Saturday.  Patient was seen by EMS Saturday who determined she was slightly hypoxic which improved but found no other concerning features and did not bring her to the ED. Saturday night patient was pulling off her CPAP and was worsened this morning.    ADLs intact up until two days ago but extend of walking is room to room -- can't get around well. Has progressive decline in memory over past two years.   Code status: Husband is POA. Does not want intubated or CPR.   Past Medical History:  Diagnosis Date  . ALLERGIC RHINITIS 05/01/2007  . Anxiety   . Arthritis    "hands" (12/28/2015)  . ASTHMA 05/01/2007   "since I was a child"  . Cancer (HCC)    LUNG  . CHF (congestive  heart failure) ([place])   . Chronic bronchitis ([place])   . COPD (chronic obstructive pulmonary disease) ([place])   . DEPRESSION 07/21/2008  . DVT (deep venous thrombosis) (HCC) 1960s   LLE  . FATIGUE 10/10/2010  . HYPERTENSION 05/01/2007  . HYPERTHYROIDISM 11/23/2007   Pt endorses having had Graves disease, possibly radioactive iodine x1, but no thyroidectomy  . HYPOTHYROIDISM 08/23/2009  . LUMBAR RADICULOPATHY, RIGHT 08/25/2008  . On home oxygen therapy    "3-4L; qd; all the time" (12/28/2015)  . OSTEOPOROSIS 05/01/2007  . SHOULDER PAIN, LEFT 08/23/2009  . SINUSITIS, CHRONIC 10/10/2010    Patient Active Problem List   Diagnosis Date Noted  . Acute respiratory failure with hypoxia and hypercarbia ([place]) 03/07/2019  . Respiratory acidosis 03/07/2019  . Respiratory failure ([place]) 12/16/2018  . Preventative health care 09/20/2018  . Edema due to congestive heart failure ([place]) 07/27/2018  . Palliative care by specialist   . Shortness of breath   . Chronic low back pain 04/30/2018  . COPD with acute exacerbation ([place])   . Hypothyroidism   . Anxiety   . Respiratory failure with hypercapnia ([place]) 03/24/2018  . Hypercarbia 03/22/2018  . Polysubstance abuse ([place]) 01/26/2018  . Aspiration into airway 06/03/2017  . Rash 07/12/2016  . Bilateral hearing loss 06/05/2016  . Hyperglycemia 05/21/2016  . Abnormal LFTs 03/24/2016  . Elevated blood sugar 03/24/2016  . Solitary pulmonary nodule 01/09/2016  .  Chronic respiratory failure with hypoxia ([place])   . On home oxygen therapy   . Chronic diastolic congestive heart failure ([place])   . Bacteremia Step viridans 12/27/2015  . Tobacco abuse disorder 12/25/2015  . Hypothyroidism following radioiodine therapy 09/26/2014  . Protein-calorie malnutrition, severe ([place]) 06/23/2014  . Cor pulmonale ([place]) 01/12/2013  . COPD (chronic obstructive pulmonary disease) ([place]) 11/29/2011  . Macrocytosis without anemia 11/09/2011  . Palliative care encounter 04/09/2011  .  SINUSITIS, CHRONIC 10/10/2010  . FATIGUE 10/10/2010  . Depression with anxiety 07/21/2008  . Essential hypertension 05/01/2007  . ALLERGIC RHINITIS 05/01/2007  . ASTHMA 05/01/2007  . OSTEOPOROSIS 05/01/2007    Past Surgical History:  Procedure Laterality Date  . APPENDECTOMY    . DILATION AND CURETTAGE OF UTERUS  multiple   history of multiple dialations and curettages and miscarriages, unfortunately never carrying a child to term  . ECTOPIC PREGNANCY SURGERY  "late '60s or early '70s  . ELECTROCARDIOGRAM  06/20/2006  . OOPHORECTOMY Right      OB History    Gravida  5   Para      Term      Preterm      AB  5   Living        SAB  4   TAB      Ectopic  1   Multiple      Live Births               Home Medications    Prior to Admission medications   Medication Sig Start Date End Date Taking? Authorizing Provider  albuterol (PROVENTIL) (2.5 MG/3ML) 0.083% nebulizer solution Take 3 mLs (2.5 mg total) by nebulization every 6 (six) hours as needed for wheezing or shortness of breath. 12/21/18   Arrien, Jimmy Picket, MD  aspirin EC 81 MG EC tablet Take 1 tablet (81 mg total) by mouth daily. 03/27/16   Velvet Bathe, MD  atorvastatin (LIPITOR) 10 MG tablet Take 1 tablet (10 mg total) by mouth daily. 09/20/18 09/20/19  Biagio Borg, MD  bisacodyl (DULCOLAX) 5 MG EC tablet Take 5 mg by mouth daily as needed for moderate constipation.    [provider]  citalopram (CELEXA) 20 MG tablet TAKE 1 TABLET (20 MG TOTAL) BY MOUTH DAILY. 07/08/19   Biagio Borg, MD  fluticasone furoate-vilanterol (BREO ELLIPTA) 200-25 MCG/INH AEPB Inhale 1 puff into the lungs daily. 05/28/18   Robbie Lis, MD  furosemide (LASIX) 40 MG tablet 1 tab by mouth in the AM daily, with 1 tab in the PM as needed for persistent swelling Patient taking differently: Take 40 mg by mouth daily.  06/16/18   Biagio Borg, MD  levothyroxine (SYNTHROID) 75 MCG tablet TAKE 1 TABLET BY MOUTH DAILY BEFORE  BREAKFAST. 05/11/19   Biagio Borg, MD  nicotine (NICODERM CQ - DOSED IN MG/24 HOURS) 21 mg/24hr patch Place 1 patch (21 mg total) onto the skin daily. Patient not taking: Reported on 07/29/2019 03/10/19   Flora Lipps, MD  OXYGEN Inhale 3 L into the lungs. 24/7    [provider]  potassium chloride (K-DUR) 10 MEQ tablet TAKE 1 TABLET BY MOUTH 2 TIMES DAILY. 03/22/19   Biagio Borg, MD  [place]    [provider]  SPIRIVA HANDIHALER 18 MCG inhalation capsule PLACE 1 CAPSULE (18 MCG TOTAL) INTO INHALER AND INHALE DAILY. Patient taking differently: Place 18 mcg into inhaler and inhale daily.  01/03/19  Rigoberto Noel, MD  traMADol (ULTRAM) 50 MG tablet Take 1 tablet (50 mg total) by mouth 2 (two) times daily as needed. 04/30/18   Biagio Borg, MD  VENTOLIN HFA 108 (90 Base) MCG/ACT inhaler INHALE 1 TO 2 PUFFS INTO THE LUNGS EVERY 6 (SIX) HOURS AS NEEDED FOR WHEEZING OR SHORTNESS OF BREATH. Patient taking differently: Inhale 1-2 puffs into the lungs every 6 (six) hours as needed for wheezing or shortness of breath.  12/22/18   Rigoberto Noel, MD    Family History Family History  Problem Relation Age of Onset  . Lung cancer Father   . Alcohol abuse Brother   . Diabetes Brother   . Hypertension Other   . Stroke Mother   . Thyroid disease Mother   . Asthma Other        maternal aunts  . Breast cancer Neg Hx     Social History Social History   Tobacco Use  . Smoking status: Former Smoker    Packs/day: 1.00    Years: 52.00    Pack years: 52.00    Types: Cigarettes    Start date: 1965    Quit date: 07/20/2016    Years since quitting: 3.0  . Smokeless tobacco: Never Used  Substance Use Topics  . Alcohol use: Yes    Alcohol/week: 2.0 standard drinks    Types: 2 Standard drinks or equivalent per week    Comment: wine coolers  . Drug use: Not Currently     Allergies   Alendronate sodium   Review of Systems Review of Systems  Unable to  perform ROS: Mental status change     Physical Exam Updated Vital Signs BP 115/66   Pulse 83   Temp 97.8 F (36.6 C) (Rectal)   Resp 17   SpO2 100%   Physical Exam Vitals signs and nursing note reviewed.  Constitutional:      General: She is not in acute distress.    Comments: Thin, small, in no acute distress, drowsy but arousable.  Able to follow simple commands.  Able to answer yes/no questions.  HENT:     Head: Normocephalic and atraumatic.     Nose: Nose normal.     Mouth/Throat:     Mouth: Mucous membranes are dry.  Eyes:     General: No scleral icterus. Neck:     Musculoskeletal: Normal range of motion.  Cardiovascular:     Rate and Rhythm: Normal rate and regular rhythm.     Pulses: Normal pulses.     Heart sounds: Normal heart sounds.     Comments:  Normal pulses upper and lower extremities Pulmonary:     Effort: Pulmonary effort is normal. No respiratory distress.     Comments: Decreased breath sounds diminished diffusely.  Slightly increased rate of breathing. Abdominal:     General: Bowel sounds are normal.     Palpations: Abdomen is soft.     Tenderness: There is no abdominal tenderness.  Musculoskeletal:     Right lower leg: No edema.     Left lower leg: No edema.     Comments: No lower extremity edema  Skin:    General: Skin is warm and dry.     Capillary Refill: Capillary refill takes less than 2 seconds.  Neurological:     General: No focal deficit present.     Mental Status: She is alert. Mental status is at baseline.     Comments: Patient moving all 4 extremities  and able to lift arms and legs against gravity.  Patient reacts to noxious stimuli appropriately.  Psychiatric:        Mood and Affect: Mood normal.        Behavior: Behavior normal.      ED Treatments / Results  Labs (all labs ordered are listed, but only abnormal results are displayed) Labs Reviewed  CBC WITH DIFFERENTIAL/PLATELET - Abnormal; Notable for the following  components:      Result Value   Hemoglobin 15.5 (*)    HCT 52.3 (*)    MCV 106.5 (*)    MCHC 29.6 (*)    Platelets 125 (*)    Lymphs Abs 0.6 (*)    All other components within normal limits  BASIC METABOLIC PANEL - Abnormal; Notable for the following components:   Chloride 84 (*)    CO2 41 (*)    Glucose, Bld 135 (*)    All other components within normal limits  SARS CORONAVIRUS 2 BY RT PCR (HOSPITAL ORDER, [facility])  URINE CULTURE  HEPATIC FUNCTION PANEL  BRAIN NATRIURETIC PEPTIDE  URINALYSIS, ROUTINE W REFLEX MICROSCOPIC  BLOOD GAS, VENOUS  TROPONIN I (HIGH SENSITIVITY)    EKG EKG Interpretation  Date/Time:  Sunday August 14 2019 13:25:28 EDT Ventricular Rate:  91 PR Interval:    QRS Duration: 80 QT Interval:  352 QTC Calculation: 433 R Axis:   84 Text Interpretation:  Sinus rhythm Biatrial enlargement Borderline right axis deviation Anteroseptal infarct, old No significant change since Confirmed by Lennice Sites [phone]) on 08/14/2019 2:19:01 PM   Radiology Ct Head Wo Contrast  Result Date: 08/14/2019 CLINICAL DATA:  Altered mental status EXAM: CT HEAD WITHOUT CONTRAST TECHNIQUE: Contiguous axial images were obtained from the base of the skull through the vertex without intravenous contrast. COMPARISON:  December 16, 2018 FINDINGS: Brain: Ventricles and sulci appear within normal limits for age, stable. There is no intracranial mass, hemorrhage, extra-axial fluid collection, or midline shift. Brain parenchyma appears unremarkable and stable. No acute infarct evident. Vascular: No hyperdense vessel. There is calcification in the carotid siphon regions, stable. Skull: Bony calvarium appears intact. Ossification along the falx is stable. Sinuses/Orbits: There is mucosal thickening in multiple ethmoid air cells. Other visualized paranasal sinuses are clear. Orbits appear symmetric bilaterally. Other: Mastoid air cells are clear. IMPRESSION: Foci of  ethmoid sinus mucosal thickening. Foci of carotid artery calcification. Brain parenchyma appears normal. No acute infarct. No mass or hemorrhage. Electronically Signed   By: Lowella Grip III M.D.   On: 08/14/2019 15:52   Dg Chest Portable 1 View  Result Date: 08/14/2019 CLINICAL DATA:  Altered mental status EXAM: PORTABLE CHEST 1 VIEW COMPARISON:  03/06/2019 chest radiograph. FINDINGS: Stable cardiomediastinal silhouette with normal heart size. No pneumothorax. No pleural effusion. Platelike scarring versus atelectasis at the extreme left lung base. No pulmonary edema. No acute consolidative airspace disease. IMPRESSION: Platelike scarring versus atelectasis at the extreme left lung base. Otherwise no active disease in the chest. Electronically Signed   By: Ilona Sorrel M.D.   On: 08/14/2019 14:34    Procedures Procedures (including critical care time)  CRITICAL CARE Performed by: Tedd Sias   Total critical care time: 35 minutes  Critical care time was exclusive of separately billable procedures and treating other patients.  Critical care was necessary to treat or prevent imminent or life-threatening deterioration.  Critical care was time spent personally by me on the following activities: development of treatment plan  with patient and/or surrogate as well as nursing, discussions with consultants, evaluation of patient's response to treatment, examination of patient, obtaining history from patient or surrogate, ordering and performing treatments and interventions, ordering and review of laboratory studies, ordering and review of radiographic studies, pulse oximetry and re-evaluation of patient's condition.    Medications Ordered in ED Medications  ipratropium (ATROVENT HFA) inhaler 2 puff (has no administration in time range)  albuterol (VENTOLIN HFA) 108 (90 Base) MCG/ACT inhaler 8 puff (8 puffs Inhalation Given 08/14/19 1410)  sodium chloride 0.9 % bolus 500 mL (0 mLs  Intravenous Stopped 08/14/19 1453)     Initial Impression / Assessment and Plan / ED Course  I have reviewed the triage vital signs and the nursing notes.  Pertinent labs & imaging results that were available during my care of the patient were reviewed by me and considered in my medical decision making (see chart for details).       Patient with increased lethargy, decreased mental status, weakness and increased shortness of breath with past 3 days. No signs of trauma, patient is afebrile with pulse within normal limits.  Increased work of breathing and decreased mental status with no focal neuro deficits on exam.  Will conduct infectious evaluation: CBC, BMP, hepatic function, BNP, venous blood gas, troponin, UA and culture.  Chest x-ray and CT head.  EKG.  Blood cultures not ordered at this time as patient has low risk for sepsis.  Reassuring basic labs, no elevation in BNP, no WBC elevation doubt infection Covid test negative.  Patient continues to appear somnolent venous blood gas results verbally given to be CO2 of 97 pH 7.3.  Troponin is within normal limits and EKG is reassuring.  CT head and chest x-ray without acute abnormality.  Patient appears to have acute on chronic respiratory failure secondary to medical noncompliance with her home CPAP and inhalers oral medications.  Doubt ACS as patient has had worsening symptoms for 3 days and has normal troponin here.  Will admit patient to hospitalist for further treatment.  BiPAP ordered.  Patient reevaluated 4 PM continues to have stable vitals but without change in mental status.   4:12 PM Discussed patient with Dr. Verlon Au who recommends step down unit appropriate and will see patient.   Final Clinical Impressions(s) / ED Diagnoses   Final diagnoses:  Acute respiratory failure with hypoxia [facility])    ED Discharge Orders    None       Tedd Sias, [place] 08/14/19 1600    Lennice Sites, DO 08/14/19 1602    Tedd Sias, PA 08/14/19 1613    Lennice Sites, DO 08/14/19 1807

## 2019-08-14 NOTE — ED Notes (Signed)
This RN attempted to call pt's POA with an update & left a message with a return number after noone answered the phone.

## 2019-08-14 NOTE — ED Notes (Signed)
Pt awake and talking, told this EMT that she was thirsty. Pt appears to be more alert now.

## 2019-08-14 NOTE — ED Notes (Addendum)
Patient currently awake and sitting on the edge of the bed. Patient is repeatedly screaming with no reasoning . PA, [place] and Dr. Ronnald Nian to bedside due to patient being confused.   Lovey Newcomer

## 2019-08-14 NOTE — ED Provider Notes (Signed)
Medical screening examination/treatment/procedure(s) were conducted as a shared visit with non-physician practitioner(s) and myself.  I personally evaluated the patient during the encounter. Briefly, the patient is a 73 y.o. female with history of COPD on 3 L of home oxygen, history of lung cancer who presents the ED with generalized weakness, altered mental status.  Patient with normal vitals.  No fever.  Somnolent but easily arousable on exam.  Does not complain of any pain.  No signs of volume overload on exam.  Has some coarse and diminished breath sounds throughout.  No abdominal tenderness.  Does not appear to have any focal findings on neurological exam.  Has history of the same kind presentation in the past.  Has had some hypercarbia.  Is supposed to wear BiPAP at night it appears.  Will try to talk with family about some collateral information.  Will get basic labs.  Look for infectious process.  Likely may be some respiratory failure will get a blood gas.  We will get a head CT.  We will get a rapid Covid as may need to be on BiPAP but does not appear to be in any respiratory distress at this time.  Will confirm CODE STATUS.  Will reevaluate.  Will give some albuterol.  She does appear very dry on exam.  Dry lips and chapped lips.  Family states has not been eating or drinking well the last several days.  EKG shows sinus rhythm.  Troponin normal.  Doubt ACS.  BNP within normal limits.  CT of the head overall unremarkable.  Chest x-ray shows no acute findings as well.  No obvious pneumonia, no pneumothorax, no pleural effusion.  Patient had blood gases showed hypercarbia with mild acidosis.  Likely in the setting of noncompliance with BiPAP.  Coronavirus test is negative.  Will initiate BiPAP.  Patient according to power of attorney is DNR.  Will admit for further respiratory care.  Otherwise no significant anemia, electrolyte abnormality, kidney injury.  This chart was dictated using voice recognition  software.  Despite best efforts to proofread,  errors can occur which can change the documentation meaning.     EKG Interpretation  Date/Time:  Sunday August 14 2019 13:25:28 EDT Ventricular Rate:  91 PR Interval:    QRS Duration: 80 QT Interval:  352 QTC Calculation: 433 R Axis:   84 Text Interpretation:  Sinus rhythm Biatrial enlargement Borderline right axis deviation Anteroseptal infarct, old No significant change since Confirmed by Lennice Sites [phone]) on 08/14/2019 2:19:01 PM           Lennice Sites, DO 08/14/19 1601

## 2019-08-14 NOTE — ED Triage Notes (Signed)
EMS reported that pt has had an AMS, feeling weak, decreased appetite & some SOB for the past 3 days (as reported from her husband). Afebrile & wearing 3L O2 via n/c at baseline.

## 2019-08-15 DIAGNOSIS — J9602 Acute respiratory failure with hypercapnia: Secondary | ICD-10-CM | POA: Diagnosis not present

## 2019-08-15 LAB — URINE CULTURE: Culture: NO GROWTH

## 2019-08-15 LAB — COMPREHENSIVE METABOLIC PANEL
ALT: 15 U/L (ref 0–44)
AST: 18 U/L (ref 15–41)
Albumin: 3.6 g/dL (ref 3.5–5.0)
Alkaline Phosphatase: 39 U/L (ref 38–126)
Anion gap: 11 (ref 5–15)
BUN: 15 mg/dL (ref 8–23)
CO2: 40 mmol/L — ABNORMAL HIGH (ref 22–32)
Calcium: 9.3 mg/dL (ref 8.9–10.3)
Chloride: 92 mmol/L — ABNORMAL LOW (ref 98–111)
Creatinine, Ser: 0.83 mg/dL (ref 0.44–1.00)
GFR calc Af Amer: >60 mL/min (ref >60)
GFR calc non Af Amer: >60 mL/min (ref >60)
Glucose, Bld: 123 mg/dL — ABNORMAL HIGH (ref 70–99)
Potassium: 4.7 mmol/L (ref 3.5–5.1)
Sodium: 143 mmol/L (ref 135–145)
Total Bilirubin: 0.5 mg/dL (ref 0.3–1.2)
Total Protein: 6.7 g/dL (ref 6.5–8.1)

## 2019-08-15 LAB — POCT I-STAT 7, (LYTES, BLD GAS, ICA,H+H)
Acid-Base Excess: 16 mmol/L — ABNORMAL HIGH (ref 0.0–2.0)
Acid-Base Excess: 20 mmol/L — ABNORMAL HIGH (ref 0.0–2.0)
Bicarbonate: 45.6 mmol/L — ABNORMAL HIGH (ref 20.0–28.0)
Bicarbonate: 48.9 mmol/L — ABNORMAL HIGH (ref 20.0–28.0)
Calcium, Ion: 1.18 mmol/L (ref 1.15–1.40)
Calcium, Ion: 1.19 mmol/L (ref 1.15–1.40)
HCT: 43 % (ref 36.0–46.0)
HCT: 40 % (ref 36.0–46.0)
Hemoglobin: 14.6 g/dL (ref 12.0–15.0)
Hemoglobin: 13.6 g/dL (ref 12.0–15.0)
O2 Saturation: 93 %
O2 Saturation: 99 %
Patient temperature: 98.6
Potassium: 4.4 mmol/L (ref 3.5–5.1)
Potassium: 3.8 mmol/L (ref 3.5–5.1)
Sodium: 138 mmol/L (ref 135–145)
Sodium: 137 mmol/L (ref 135–145)
TCO2: 48 mmol/L — ABNORMAL HIGH (ref 22–32)
TCO2: >50 mmol/L — ABNORMAL HIGH (ref 22–32)
pCO2 arterial: 77.7 mmHg (ref 32.0–48.0)
pCO2 arterial: 74.6 mmHg (ref 32.0–48.0)
pH, Arterial: 7.376 (ref 7.350–7.450)
pH, Arterial: 7.424 (ref 7.350–7.450)
pO2, Arterial: 75 mmHg — ABNORMAL LOW (ref 83.0–108.0)
pO2, Arterial: 153 mmHg — ABNORMAL HIGH (ref 83.0–108.0)

## 2019-08-15 LAB — URINALYSIS, MICROSCOPIC (REFLEX)

## 2019-08-15 LAB — URINALYSIS, ROUTINE W REFLEX MICROSCOPIC
Bilirubin Urine: NEGATIVE
Glucose, UA: NEGATIVE mg/dL
Ketones, ur: 15 mg/dL — AB
Leukocytes,Ua: NEGATIVE
Nitrite: NEGATIVE
Protein, ur: 100 mg/dL — AB
Specific Gravity, Urine: >1.03 — ABNORMAL HIGH (ref 1.005–1.030)
pH: 6.5 (ref 5.0–8.0)

## 2019-08-15 LAB — CBC
HCT: 43.9 % (ref 36.0–46.0)
Hemoglobin: 12.9 g/dL (ref 12.0–15.0)
MCH: 30.9 pg (ref 26.0–34.0)
MCHC: 29.4 g/dL — ABNORMAL LOW (ref 30.0–36.0)
MCV: 105.3 fL — ABNORMAL HIGH (ref 80.0–100.0)
Platelets: 122 10*3/uL — ABNORMAL LOW (ref 150–400)
RBC: 4.17 MIL/uL (ref 3.87–5.11)
RDW: 13.7 % (ref 11.5–15.5)
WBC: 4.3 10*3/uL (ref 4.0–10.5)
nRBC: 0 % (ref 0.0–0.2)

## 2019-08-15 LAB — RAPID URINE DRUG SCREEN, HOSP PERFORMED
Amphetamines: NOT DETECTED
Barbiturates: NOT DETECTED
Benzodiazepines: NOT DETECTED
Cocaine: NOT DETECTED
Opiates: POSITIVE — AB
Tetrahydrocannabinol: NOT DETECTED

## 2019-08-15 MED ORDER — IPRATROPIUM-ALBUTEROL 0.5-2.5 (3) MG/3ML IN SOLN
3.0000 mL | Freq: Four times a day (QID) | RESPIRATORY_TRACT | Status: DC
  Administered 2019-08-15 (×2): 3 mL via RESPIRATORY_TRACT
  Filled 2019-08-15: qty 3

## 2019-08-15 MED ORDER — IPRATROPIUM-ALBUTEROL 0.5-2.5 (3) MG/3ML IN SOLN
3.0000 mL | Freq: Three times a day (TID) | RESPIRATORY_TRACT | Status: DC
  Administered 2019-08-16 – 2019-08-19 (×9): 3 mL via RESPIRATORY_TRACT
  Filled 2019-08-15 (×10): qty 3

## 2019-08-15 MED ORDER — INFLUENZA VAC A&B SA ADJ QUAD 0.5 ML IM PRSY
0.5000 mL | PREFILLED_SYRINGE | INTRAMUSCULAR | Status: DC
  Filled 2019-08-15: qty 0.5

## 2019-08-15 NOTE — ED Notes (Signed)
ED TO INPATIENT HANDOFF REPORT  ED Nurse Name and Phone #: [phone]  S Name/Age/Gender Jasmine [name] 73 y.o. female Room/Bed: 023C/023C  Code Status   Code Status: DNR  Home/SNF/Other Home Patient oriented to: self Is this baseline? Yes   Triage Complete: Triage complete  Chief Complaint sob  Triage Note EMS reported that pt has had an AMS, feeling weak, decreased appetite & some SOB for the past 3 days (as reported from her husband). Afebrile & wearing 3L O2 via n/c at baseline.   Allergies Allergies  Allergen Reactions  . Alendronate Sodium Other (See Comments)    Patient does not remember this reaction    Level of Care/Admitting Diagnosis ED Disposition    ED Disposition Condition Comment   Admit  Hospital Area: [facility] [[id number]]  Level of Care: Progressive [102]  Covid Evaluation: Confirmed COVID Negative  Diagnosis: [facility]chronic obstructive pulmonary disease) [id number]  Admitting Physician: Nita Sells [phone]  Attending Physician: Nita Sells [phone]  Estimated length of stay: 3 - 4 days  Certification:: I certify this patient will need inpatient services for at least 2 midnights  PT Class (Do Not Modify): Inpatient [101]  PT Acc Code (Do Not Modify): Private [1]       B Medical/Surgery History Past Medical History:  Diagnosis Date  . ALLERGIC RHINITIS 05/01/2007  . Anxiety   . Arthritis    "hands" (12/28/2015)  . ASTHMA 05/01/2007   "since I was a child"  . Cancer (HCC)    LUNG  . CHF (congestive heart failure) ([place])   . Chronic bronchitis ([place])   . COPD (chronic obstructive pulmonary disease) ([place])   . DEPRESSION 07/21/2008  . DVT (deep venous thrombosis) (HCC) 1960s   LLE  . FATIGUE 10/10/2010  . HYPERTENSION 05/01/2007  . HYPERTHYROIDISM 11/23/2007   Pt endorses having had Graves disease, possibly radioactive iodine x1, but no thyroidectomy  . HYPOTHYROIDISM 08/23/2009  . LUMBAR RADICULOPATHY, RIGHT  08/25/2008  . On home oxygen therapy    "3-4L; qd; all the time" (12/28/2015)  . OSTEOPOROSIS 05/01/2007  . SHOULDER PAIN, LEFT 08/23/2009  . SINUSITIS, CHRONIC 10/10/2010   Past Surgical History:  Procedure Laterality Date  . APPENDECTOMY    . DILATION AND CURETTAGE OF UTERUS  multiple   history of multiple dialations and curettages and miscarriages, unfortunately never carrying a child to term  . ECTOPIC PREGNANCY SURGERY  "late '60s or early '70s  . ELECTROCARDIOGRAM  06/20/2006  . OOPHORECTOMY Right      A IV Location/Drains/Wounds Patient Lines/Drains/Airways Status   Active Line/Drains/Airways    Name:   Placement date:   Placement time:   Site:   Days:   Peripheral IV 08/14/19 Right Wrist   08/14/19    1358    Wrist   1          Intake/Output Last 24 hours No intake or output data in the 24 hours ending 08/15/19 0529  Labs/Imaging Results for orders placed or performed during the hospital encounter of 08/14/19 (from the past 48 hour(s))  CBC with Differential     Status: Abnormal   Collection Time: 08/14/19  1:54 PM  Result Value Ref Range   WBC 4.5 4.0 - 10.5 K/uL   RBC 4.91 3.87 - 5.11 MIL/uL   Hemoglobin 15.5 (H) 12.0 - 15.0 g/dL   HCT 52.3 (H) 36.0 - 46.0 %   MCV 106.5 (H) 80.0 - 100.0 fL   MCH 31.6 26.0 -  34.0 pg   MCHC 29.6 (L) 30.0 - 36.0 g/dL   RDW 13.7 11.5 - 15.5 %   Platelets 125 (L) 150 - 400 K/uL   nRBC 0.0 0.0 - 0.2 %   Neutrophils Relative % 80 %   Neutro Abs 3.6 1.7 - 7.7 K/uL   Lymphocytes Relative 13 %   Lymphs Abs 0.6 (L) 0.7 - 4.0 K/uL   Monocytes Relative 6 %   Monocytes Absolute 0.3 0.1 - 1.0 K/uL   Eosinophils Relative 0 %   Eosinophils Absolute 0.0 0.0 - 0.5 K/uL   Basophils Relative 1 %   Basophils Absolute 0.0 0.0 - 0.1 K/uL   Immature Granulocytes 0 %   Abs Immature Granulocytes 0.02 0.00 - 0.07 K/uL    Comment: Performed at [facility] [address] [place], [place] [id number]  Basic metabolic panel     Status: Abnormal    Collection Time: 08/14/19  1:54 PM  Result Value Ref Range   Sodium 139 135 - 145 mmol/L   Potassium 4.8 3.5 - 5.1 mmol/L   Chloride 84 (L) 98 - 111 mmol/L   CO2 41 (H) 22 - 32 mmol/L   Glucose, Bld 135 (H) 70 - 99 mg/dL   BUN 10 8 - 23 mg/dL   Creatinine, Ser 0.66 0.44 - 1.00 mg/dL   Calcium 9.7 8.9 - 10.3 mg/dL   GFR calc non Af Amer >60 >60 mL/min   GFR calc Af Amer >60 >60 mL/min   Anion gap 14 5 - 15    Comment: Performed at [facility], [place] [address] [place], [place] [address]  Hepatic function panel     Status: None   Collection Time: 08/14/19  1:54 PM  Result Value Ref Range   Total Protein 8.1 6.5 - 8.1 g/dL   Albumin 4.4 3.5 - 5.0 g/dL   AST 26 15 - 41 U/L   ALT 17 0 - 44 U/L   Alkaline Phosphatase 54 38 - 126 U/L   Total Bilirubin 0.8 0.3 - 1.2 mg/dL   Bilirubin, Direct 0.1 0.0 - 0.2 mg/dL   Indirect Bilirubin 0.7 0.3 - 0.9 mg/dL    Comment: Performed at [facility] [address], [place], [place] [address]  Troponin I (High Sensitivity)     Status: None   Collection Time: 08/14/19  1:54 PM  Result Value Ref Range   Troponin I (High Sensitivity) 6 <18 ng/L    Comment: (NOTE) Elevated high sensitivity troponin I (hsTnI) values and significant  changes across serial measurements may suggest ACS but many other  chronic and acute conditions are known to elevate hsTnI results.  Refer to the "Links" section for chest pain algorithms and additional  guidance. Performed at [facility], [place] [address] [place], [place] [address]   Brain natriuretic peptide     Status: None   Collection Time: 08/14/19  1:54 PM  Result Value Ref Range   B Natriuretic Peptide 30.8 0.0 - 100.0 pg/mL    Comment: Performed at [facility] [address] [place], [place] [address]  SARS Coronavirus 2 by RT PCR (hospital order, performed in [facility] hospital lab) Nasopharyngeal Nasopharyngeal Swab     Status: None   Collection Time: 08/14/19  1:59 PM    Specimen: Nasopharyngeal Swab  Result Value Ref Range   SARS Coronavirus 2 NEGATIVE NEGATIVE    Comment: (NOTE) If result is NEGATIVE SARS-CoV-2 target  nucleic acids are NOT DETECTED. The SARS-CoV-2 RNA is generally detectable in upper and lower  respiratory specimens during the acute phase of infection. The lowest  concentration of SARS-CoV-2 viral copies this assay can detect is 250  copies / mL. A negative result does not preclude SARS-CoV-2 infection  and should not be used as the sole basis for treatment or other  patient management decisions.  A negative result may occur with  improper specimen collection / handling, submission of specimen other  than nasopharyngeal swab, presence of viral mutation(s) within the  areas targeted by this assay, and inadequate number of viral copies  (<250 copies / mL). A negative result must be combined with clinical  observations, patient history, and epidemiological information. If result is POSITIVE SARS-CoV-2 target nucleic acids are DETECTED. The SARS-CoV-2 RNA is generally detectable in upper and lower  respiratory specimens dur ing the acute phase of infection.  Positive  results are indicative of active infection with SARS-CoV-2.  Clinical  correlation with patient history and other diagnostic information is  necessary to determine patient infection status.  Positive results do  not rule out bacterial infection or co-infection with other viruses. If result is PRESUMPTIVE POSTIVE SARS-CoV-2 nucleic acids MAY BE PRESENT.   A presumptive positive result was obtained on the submitted specimen  and confirmed on repeat testing.  While 2019 novel coronavirus  (SARS-CoV-2) nucleic acids may be present in the submitted sample  additional confirmatory testing may be necessary for epidemiological  and / or clinical management purposes  to differentiate between  SARS-CoV-2 and other Sarbecovirus currently known to infect humans.  If clinically  indicated additional testing with an alternate test  methodology [id number]) is advised. The SARS-CoV-2 RNA is generally  detectable in upper and lower respiratory sp ecimens during the acute  phase of infection. The expected result is Negative. Fact Sheet for Patients:  StrictlyIdeas.no Fact Sheet for Healthcare Providers: BankingDealers.co.za This test is not yet approved or cleared by the [place] FDA and has been authorized for detection and/or diagnosis of SARS-CoV-2 by FDA under an Emergency Use Authorization (EUA).  This EUA will remain in effect (meaning this test can be used) for the duration of the COVID-19 declaration under Section 564(b)(1) of the Act, 21 U.S.C. section 360bbb-3(b)(1), unless the authorization is terminated or revoked sooner. Performed at [facility], [place] [address] [place], [place] [address]   I-STAT 7, (LYTES, BLD GAS, ICA, H+H)     Status: Abnormal   Collection Time: 08/15/19  2:00 AM  Result Value Ref Range   pH, Arterial 7.376 7.350 - 7.450   pCO2 arterial 77.7 (HH) 32.0 - 48.0 mmHg   pO2, Arterial 75.0 (L) 83.0 - 108.0 mmHg   Bicarbonate 45.6 (H) 20.0 - 28.0 mmol/L   TCO2 48 (H) 22 - 32 mmol/L   O2 Saturation 93.0 %   Acid-Base Excess 16.0 (H) 0.0 - 2.0 mmol/L   Sodium 138 135 - 145 mmol/L   Potassium 4.4 3.5 - 5.1 mmol/L   Calcium, Ion 1.18 1.15 - 1.40 mmol/L   HCT 43.0 36.0 - 46.0 %   Hemoglobin 14.6 12.0 - 15.0 g/dL   Patient temperature 98.6 F    Collection site RADIAL, ALLEN'S TEST ACCEPTABLE    Drawn by RT    Sample type ARTERIAL    Comment NOTIFIED PHYSICIAN   Urinalysis, Routine w reflex microscopic     Status: Abnormal   Collection Time: 08/15/19  2:58 AM  Result  Value Ref Range   Color, Urine YELLOW YELLOW   APPearance CLEAR CLEAR   Specific Gravity, Urine >1.030 (H) 1.005 - 1.030   pH 6.5 5.0 - 8.0   Glucose, UA NEGATIVE NEGATIVE mg/dL   Hgb urine dipstick SMALL (A)  NEGATIVE   Bilirubin Urine NEGATIVE NEGATIVE   Ketones, ur 15 (A) NEGATIVE mg/dL   Protein, ur 100 (A) NEGATIVE mg/dL   Nitrite NEGATIVE NEGATIVE   Leukocytes,Ua NEGATIVE NEGATIVE    Comment: Performed at [facility] [address], [place], [place] [address]  Urine rapid drug screen (hosp performed)     Status: Abnormal   Collection Time: 08/15/19  2:58 AM  Result Value Ref Range   Opiates POSITIVE (A) NONE DETECTED   Cocaine NONE DETECTED NONE DETECTED   Benzodiazepines NONE DETECTED NONE DETECTED   Amphetamines NONE DETECTED NONE DETECTED   Tetrahydrocannabinol NONE DETECTED NONE DETECTED   Barbiturates NONE DETECTED NONE DETECTED    Comment: (NOTE) DRUG SCREEN FOR MEDICAL PURPOSES ONLY.  IF CONFIRMATION IS NEEDED FOR ANY PURPOSE, NOTIFY LAB WITHIN 5 DAYS. LOWEST DETECTABLE LIMITS FOR URINE DRUG SCREEN Drug Class                     Cutoff (ng/mL) Amphetamine and metabolites    1000 Barbiturate and metabolites    200 Benzodiazepine                 [id number] Tricyclics and metabolites     300 Opiates and metabolites        300 Cocaine and metabolites        300 THC                            50 Performed at [facility], [place] [address], [place], [place] [address]   Urinalysis, Microscopic (reflex)     Status: Abnormal   Collection Time: 08/15/19  2:58 AM  Result Value Ref Range   RBC / HPF 0-5 0 - 5 RBC/hpf   WBC, UA 0-5 0 - 5 WBC/hpf   Bacteria, UA RARE (A) NONE SEEN   Squamous Epithelial / LPF 0-5 0 - 5   Urine-Other LESS THAN 10 mL OF URINE SUBMITTED     Comment: Performed at [facility], [place] [address], [place], [place] [address]   Ct Head Wo Contrast  Result Date: 08/14/2019 CLINICAL DATA:  Altered mental status EXAM: CT HEAD WITHOUT CONTRAST TECHNIQUE: Contiguous axial images were obtained from the base of the skull through the vertex without intravenous contrast. COMPARISON:  December 16, 2018 FINDINGS: Brain: Ventricles and sulci appear within  normal limits for age, stable. There is no intracranial mass, hemorrhage, extra-axial fluid collection, or midline shift. Brain parenchyma appears unremarkable and stable. No acute infarct evident. Vascular: No hyperdense vessel. There is calcification in the carotid siphon regions, stable. Skull: Bony calvarium appears intact. Ossification along the falx is stable. Sinuses/Orbits: There is mucosal thickening in multiple ethmoid air cells. Other visualized paranasal sinuses are clear. Orbits appear symmetric bilaterally. Other: Mastoid air cells are clear. IMPRESSION: Foci of ethmoid sinus mucosal thickening. Foci of carotid artery calcification. Brain parenchyma appears normal. No acute infarct. No mass or hemorrhage. Electronically Signed   By: Lowella Grip III M.D.   On: 08/14/2019 15:52   Dg Chest Portable 1 View  Result Date: 08/14/2019 CLINICAL DATA:  Altered mental status EXAM: PORTABLE CHEST 1  VIEW COMPARISON:  03/06/2019 chest radiograph. FINDINGS: Stable cardiomediastinal silhouette with normal heart size. No pneumothorax. No pleural effusion. Platelike scarring versus atelectasis at the extreme left lung base. No pulmonary edema. No acute consolidative airspace disease. IMPRESSION: Platelike scarring versus atelectasis at the extreme left lung base. Otherwise no active disease in the chest. Electronically Signed   By: Ilona Sorrel M.D.   On: 08/14/2019 14:34    Pending Labs Unresulted Labs (From admission, onward)    Start     Ordered   08/15/19 0500  Comprehensive metabolic panel  Tomorrow morning,   R     08/14/19 1742   08/15/19 0500  CBC  Tomorrow morning,   R     08/14/19 1742   08/15/19 0118  Blood gas, arterial  ONCE - STAT,   STAT     08/15/19 0117   08/14/19 1330  Urine culture  ONCE - STAT,   STAT     08/14/19 1330   08/14/19 1330  Blood gas, venous (at [facility] and [facility], not at [facility])  ONCE - STAT,   STAT     08/14/19 1330          Vitals/Pain Today's Vitals   08/15/19  0300 08/15/19 0452 08/15/19 0452 08/15/19 0454  BP: 112/76 115/71 122/66   Pulse: 69 62 72   Resp: 14 17 16    Temp:      TempSrc:      SpO2: 100% 100% 99%   PainSc:   0-No pain 0-No pain    Isolation Precautions No active isolations  Medications Medications  ipratropium (ATROVENT HFA) inhaler 2 puff (2 puffs Inhalation Not Given 08/14/19 1707)  methylPREDNISolone sodium succinate (SOLU-MEDROL) 40 mg/mL injection 40 mg (40 mg Intravenous Given 08/14/19 2223)  ipratropium-albuterol (DUONEB) 0.5-2.5 (3) MG/3ML nebulizer solution 3 mL (3 mLs Nebulization Given 08/15/19 0452)  aspirin EC tablet 81 mg (81 mg Oral Not Given 08/14/19 1744)  levothyroxine (SYNTHROID) tablet 75 mcg (75 mcg Oral Not Given 08/14/19 1745)  fluticasone furoate-vilanterol (BREO ELLIPTA) 200-25 MCG/INH 1 puff (1 puff Inhalation Not Given 08/14/19 1745)  enoxaparin (LOVENOX) injection 40 mg (40 mg Subcutaneous Given 08/14/19 2220)  levofloxacin (LEVAQUIN) IVPB 750 mg (750 mg Intravenous Bolus from Bag 08/14/19 1820)  umeclidinium bromide (INCRUSE ELLIPTA) 62.5 MCG/INH 1 puff (1 puff Inhalation Not Given 08/14/19 1922)  albuterol (VENTOLIN HFA) 108 (90 Base) MCG/ACT inhaler 8 puff (8 puffs Inhalation Given 08/14/19 1410)  sodium chloride 0.9 % bolus 500 mL (0 mLs Intravenous Stopped 08/14/19 1453)  haloperidol lactate (HALDOL) injection 2 mg (2 mg Intravenous Given 08/14/19 2026)    Mobility walks with person assist High fall risk   Focused Assessments    R Recommendations: See Admitting Provider Note  Report given to:   Additional Notes:

## 2019-08-15 NOTE — ED Notes (Signed)
Niece at bedside

## 2019-08-15 NOTE — Progress Notes (Signed)
Hospitalist progress note  If 7PM-7AM,  night-coverage-look on AMION -prefer pages-not epic chat,please  Jasmine [name]  [id number] DOB: 05-06-1946 DOA: 08/14/2019 PCP: Biagio Borg, MD  Narrative:  49 community dwelling black female COPD 3 L and supposed to be on trilogy vent-On 18/6 full facemask 35% FiO2 at home, diastolic heart failure cor pulmonale last echo 12/17/2018 EF 65-70% depression on Celexa hypothyroidism, ?  Bilateral lung masses in the past in 2017?  Crack cocaine use  Admit 10/25 hypercarbic respiratory failure CO2 in the 90 range lethargic and placed on BiPAP  Assessment & Plan: Hypercarbic respiratory failure-on trilogy vent 18/6 Stage III COPD on 3 L Continuing Solu-Medrol 40 every 6 nebulizers with DuoNeb Dulera and Breo Ellipta-continue Levaquin Okay for clear liquids at this time and trial off BiPAP although in the room she desats down to low 80s range on 5 L-May need to resume Prior cocaine abuse- drug screen negative HFpEF history EF 65-70% 2/20- Lasix 40 held-not on beta-blocker etc. at home, continue aspirin 81 Depression Continue Celexa 20 daily  DVT prophylaxis: Lovenox  Code Status:   Called     Family Communication:   Called and updated HCPOA Ms Dorna Mai- she can ambulate from hall to bathroom-this has been going on for the past the patient has been in the past considered is DNR but she expresses some surprise when I mentioned this to her in person and says she did not think that she was a sick as she was I have asked for family as well as HC POA and her significant other to clarify CODE STATUS by the end of the day Disposition Plan: inpatient to SDU  Consultants:   none Procedures:   no Antimicrobials:   levaquin  Subjective:  More awake in no distress seems coherent can tell me date/time/year Objective: Vitals:   08/15/19 0300 08/15/19 0452 08/15/19 0452 08/15/19 0727  BP: 112/76 115/71 122/66   Pulse: 69 62 72 68  Resp: 14 17 16   (!) 22  Temp:      TempSrc:      SpO2: 100% 100% 99%    No intake or output data in the 24 hours ending 08/15/19 0825 There were no vitals filed for this visit.  Examination: Frail cachectic slightly tachypneic s1 s2 sinus arrythmia possible M Clear post lat no adde d sound Abd soft nt nd no rebound no guard Neuro intact with no deficit  Data Reviewed: I have personally reviewed following labs and imaging studies CBC: Recent Labs  Lab 08/14/19 1354 08/15/19 0200 08/15/19 0500  WBC 4.5  --  4.3  NEUTROABS 3.6  --   --   HGB 15.5* 14.6 12.9  HCT 52.3* 43.0 43.9  MCV 106.5*  --  105.3*  PLT 125*  --  123XX123*   Basic Metabolic Panel: Recent Labs  Lab 08/14/19 1354 08/15/19 0200 08/15/19 0500  NA 139 138 143  K 4.8 4.4 4.7  CL 84*  --  92*  CO2 41*  --  40*  GLUCOSE 135*  --  123*  BUN 10  --  15  CREATININE 0.66  --  0.83  CALCIUM 9.7  --  9.3   GFR: CrCl cannot be calculated (Unknown ideal weight.). Liver Function Tests: Recent Labs  Lab 08/14/19 1354 08/15/19 0500  AST 26 18  ALT 17 15  ALKPHOS 54 39  BILITOT 0.8 0.5  PROT 8.1 6.7  ALBUMIN 4.4 3.6   No results for input(s): LIPASE,  AMYLASE in the last 168 hours. No results for input(s): AMMONIA in the last 168 hours. Coagulation Profile: No results for input(s): INR, PROTIME in the last 168 hours. Cardiac Enzymes: Radiology Studies: Reviewed images personally in health database  Scheduled Meds: . aspirin EC  81 mg Oral Daily  . enoxaparin (LOVENOX) injection  40 mg Subcutaneous Q24H  . fluticasone furoate-vilanterol  1 puff Inhalation Daily  . ipratropium  2 puff Inhalation Once  . ipratropium-albuterol  3 mL Nebulization Q4H  . levothyroxine  75 mcg Oral Daily  . methylPREDNISolone (SOLU-MEDROL) injection  40 mg Intravenous Q6H  . umeclidinium bromide  1 puff Inhalation Daily   Continuous Infusions: . levofloxacin (LEVAQUIN) IV Stopped (08/14/19 1920)    LOS: 1 day   Time spent: awake  Verneita Griffes, MD [facility]  08/15/2019, 8:25 AM

## 2019-08-15 NOTE — ED Notes (Signed)
This RN paged MD Samtani to let him know the pt wants to make changes to her Code status, states she wants to be defibrillated and receive medications, does not wish to be intubated.

## 2019-08-15 NOTE — ED Notes (Signed)
Pt was bladder scanned and revealed a 367ml content of urine.

## 2019-08-15 NOTE — Progress Notes (Signed)
Jasmine [name] [id number] Admission Data: 08/15/2019 7:38 PM Attending Provider: Nita Sells, MD  [id number], Hunt Oris, MD Consults/ Treatment Team:   [name] is a 73 y.o. female patient admitted from ED awake, alert  & orientated  X 4,  Partial Code, VSS - Blood pressure 127/72, pulse 72, temperature 98.6 F (37 C), temperature source Oral, resp. rate 16, SpO2 100 %., O2    3 L nasal cannular, no c/o shortness of breath, no c/o chest pain, no distress noted. Tele # [id number] placed and pt is currently running:normal sinus rhythm.   IV site WDL:  forearm right, condition patent and no redness with a transparent dsg that's clean dry and intact.  Allergies:   Allergies  Allergen Reactions  . Alendronate Sodium Other (See Comments)    Patient does not remember this reaction     Past Medical History:  Diagnosis Date  . ALLERGIC RHINITIS 05/01/2007  . Anxiety   . Arthritis    "hands" (12/28/2015)  . ASTHMA 05/01/2007   "since I was a child"  . Cancer (HCC)    LUNG  . CHF (congestive heart failure) ([place])   . Chronic bronchitis ([place])   . COPD (chronic obstructive pulmonary disease) ([place])   . DEPRESSION 07/21/2008  . DVT (deep venous thrombosis) (HCC) 1960s   LLE  . FATIGUE 10/10/2010  . HYPERTENSION 05/01/2007  . HYPERTHYROIDISM 11/23/2007   Pt endorses having had Graves disease, possibly radioactive iodine x1, but no thyroidectomy  . HYPOTHYROIDISM 08/23/2009  . LUMBAR RADICULOPATHY, RIGHT 08/25/2008  . On home oxygen therapy    "3-4L; qd; all the time" (12/28/2015)  . OSTEOPOROSIS 05/01/2007  . SHOULDER PAIN, LEFT 08/23/2009  . SINUSITIS, CHRONIC 10/10/2010     Pt orientation to unit, room and routine. Information packet given to patient/family and safety video watched.  Admission INP armband ID verified with patient, and in place. SR up x 2, fall risk assessment complete with Patient and family verbalizing understanding of risks associated with falls. Pt verbalizes an  understanding of how to use the call bell and to call for help before getting out of bed.  Skin, clean-dry- intact without evidence of bruising, or skin tears.  No evidence of skin break down noted on exam.     Will cont to monitor and assist as needed.  Sherren Kerns, RN 08/15/2019 7:38 PM

## 2019-08-16 DIAGNOSIS — J9602 Acute respiratory failure with hypercapnia: Secondary | ICD-10-CM | POA: Diagnosis not present

## 2019-08-16 DIAGNOSIS — E440 Moderate protein-calorie malnutrition: Secondary | ICD-10-CM | POA: Insufficient documentation

## 2019-08-16 LAB — BASIC METABOLIC PANEL
Anion gap: 11 (ref 5–15)
BUN: 15 mg/dL (ref 8–23)
CO2: 38 mmol/L — ABNORMAL HIGH (ref 22–32)
Calcium: 9.2 mg/dL (ref 8.9–10.3)
Chloride: 89 mmol/L — ABNORMAL LOW (ref 98–111)
Creatinine, Ser: 0.78 mg/dL (ref 0.44–1.00)
GFR calc Af Amer: >60 mL/min (ref >60)
GFR calc non Af Amer: >60 mL/min (ref >60)
Glucose, Bld: 119 mg/dL — ABNORMAL HIGH (ref 70–99)
Potassium: 4.2 mmol/L (ref 3.5–5.1)
Sodium: 138 mmol/L (ref 135–145)

## 2019-08-16 LAB — CBC WITH DIFFERENTIAL/PLATELET
Abs Immature Granulocytes: 0.02 10*3/uL (ref 0.00–0.07)
Basophils Absolute: 0 10*3/uL (ref 0.0–0.1)
Basophils Relative: 0 %
Eosinophils Absolute: 0 10*3/uL (ref 0.0–0.5)
Eosinophils Relative: 0 %
HCT: 39.6 % (ref 36.0–46.0)
Hemoglobin: 12.4 g/dL (ref 12.0–15.0)
Immature Granulocytes: 0 %
Lymphocytes Relative: 7 %
Lymphs Abs: 0.4 10*3/uL — ABNORMAL LOW (ref 0.7–4.0)
MCH: 31.3 pg (ref 26.0–34.0)
MCHC: 31.3 g/dL (ref 30.0–36.0)
MCV: 100 fL (ref 80.0–100.0)
Monocytes Absolute: 0.3 10*3/uL (ref 0.1–1.0)
Monocytes Relative: 5 %
Neutro Abs: 5.3 10*3/uL (ref 1.7–7.7)
Neutrophils Relative %: 88 %
Platelets: 145 10*3/uL — ABNORMAL LOW (ref 150–400)
RBC: 3.96 MIL/uL (ref 3.87–5.11)
RDW: 13.5 % (ref 11.5–15.5)
WBC: 6.1 10*3/uL (ref 4.0–10.5)
nRBC: 0 % (ref 0.0–0.2)

## 2019-08-16 LAB — COMPREHENSIVE METABOLIC PANEL
ALT: 13 U/L (ref 0–44)
AST: 29 U/L (ref 15–41)
Albumin: 3.9 g/dL (ref 3.5–5.0)
Alkaline Phosphatase: 44 U/L (ref 38–126)
Anion gap: 17 — ABNORMAL HIGH (ref 5–15)
BUN: 18 mg/dL (ref 8–23)
CO2: 34 mmol/L — ABNORMAL HIGH (ref 22–32)
Calcium: 10 mg/dL (ref 8.9–10.3)
Chloride: 86 mmol/L — ABNORMAL LOW (ref 98–111)
Creatinine, Ser: 0.9 mg/dL (ref 0.44–1.00)
GFR calc Af Amer: >60 mL/min (ref >60)
GFR calc non Af Amer: >60 mL/min (ref >60)
Glucose, Bld: 204 mg/dL — ABNORMAL HIGH (ref 70–99)
Potassium: 4.1 mmol/L (ref 3.5–5.1)
Sodium: 137 mmol/L (ref 135–145)
Total Bilirubin: UNDETERMINED mg/dL (ref 0.3–1.2)
Total Protein: 7.5 g/dL (ref 6.5–8.1)

## 2019-08-16 MED ORDER — PREDNISONE 20 MG PO TABS
60.0000 mg | ORAL_TABLET | Freq: Every day | ORAL | Status: DC
  Administered 2019-08-16 – 2019-08-19 (×4): 60 mg via ORAL
  Filled 2019-08-16 (×4): qty 3

## 2019-08-16 MED ORDER — ENSURE ENLIVE PO LIQD
237.0000 mL | Freq: Two times a day (BID) | ORAL | Status: DC
  Administered 2019-08-16 – 2019-08-19 (×6): 237 mL via ORAL

## 2019-08-16 MED ORDER — LEVOFLOXACIN IN D5W 500 MG/100ML IV SOLN
500.0000 mg | INTRAVENOUS | Status: DC
  Administered 2019-08-16 – 2019-08-17 (×2): 500 mg via INTRAVENOUS
  Filled 2019-08-16 (×3): qty 100

## 2019-08-16 NOTE — Consult Note (Signed)
   [facility] Inpatient Consult   08/16/2019  Jasmine [name] Oct 27, 1945 [id number]  Patient is currently active with [facility] Care Management for chronic disease management services for COPD.  Patient has been engaged by a [place] [facility] Management Coordinator. Patient in the [place].   Our community based plan of care has focused on disease management and community resource support.   Chart review reveals per MD history and physical as follows below that is included but not limited to:   74 community dwelling black female COPD 3 L and supposed to be on trilogy vent-On 18/6 full facemask 35% FiO2 at home, diastolic heart failure cor pulmonale last echo 12/17/2018 EF 65-70% depression on Celexa hypothyroidism, ?  Bilateral lung masses in the past in 2017?  Crack cocaine use  History severely limited patient is somewhat confused she is unable to tell much history husband is not available at this time-patient apparently has been lethargic somnolent for the past several days and has not been able to do her usual ADLs, stopped taking medications seen by EMS found to be slightly hypoxic [per notes].    Plan: Will follow for disposition and needs with the [facility]patient [facility] team member and notified by message to make aware that [facility] Management following.   Of note, [facility] Care Management services does not replace or interfere with any services that are needed or arranged by inpatient [facility] care management team.  For additional questions or referrals please contact:  Natividad Brood, RN BSN [facility] Liaison  [phone] business mobile phone Toll free office [phone]  Fax number: [phone] [email or web address]

## 2019-08-16 NOTE — Progress Notes (Signed)
Initial Nutrition Assessment  DOCUMENTATION CODES:   Non-severe (moderate) malnutrition in context of chronic illness  INTERVENTION:   Recommend diet advancement, spoke with MD and ordered  Ensure Enlive po BID, each supplement provides 350 kcal and 20 grams of protein  Encouraged PO intake at meals Discussed importance of adequate nutrition with patient after discharge. Encouraged pt to drink 2 supplements per day, suggestions provided.   NUTRITION DIAGNOSIS:   Moderate Malnutrition related to chronic illness, decreased appetite(COPD) as evidenced by energy intake < 75% for > or equal to 1 month, moderate muscle depletion.  GOAL:   Patient will meet greater than or equal to 90% of their needs  MONITOR:   PO intake, Supplement acceptance  REASON FOR ASSESSMENT:   Consult Assessment of nutrition requirement/status  ASSESSMENT:   Pt with PMH of severe COPD on trilogy vent at home, CHF, cor pulmonale, depression, previous crack cocaine use who quit smoking 3 years ago now admitted with acute superimposed on chronic respiratory failure type II with hypercarbia   Spoke with pt who is able to provide her history. Noted no SOB during interview wit patient. She reports weight stable and reports a usual weight of 115 lb.   She reports her appetite is up and down. She usually wakes late and eats brunch which is usually cereal or eggs/sausage/toast. She only eats one other meal per day which is dinner. She reports that she eats a full meal at dinner but many nights per week she skips dinner. Pt usually stays up late at night but does not often snack during this time.    Medications reviewed and include: solu-medrol Labs reviewed    NUTRITION - FOCUSED PHYSICAL EXAM:    Most Recent Value  Orbital Region  No depletion  Upper Arm Region  No depletion  Thoracic and Lumbar Region  No depletion  Buccal Region  No depletion  Temple Region  Moderate depletion  Clavicle Bone Region   No depletion  Clavicle and Acromion Bone Region  No depletion  Scapular Bone Region  No depletion  Dorsal Hand  Moderate depletion  Patellar Region  No depletion  Anterior Thigh Region  No depletion  Posterior Calf Region  No depletion  Edema (RD Assessment)  None  Hair  Reviewed  Eyes  Reviewed  Mouth  Unable to assess  Skin  Reviewed [dry]  Nails  Reviewed       Diet Order:   Diet Order            DIET DYS 3 Room service appropriate? Yes; Fluid consistency: Thin  Diet effective now              EDUCATION NEEDS:   Education needs have been addressed  Skin:  Skin Assessment: Reviewed RN Assessment  Last BM:  10/26  Height:   Ht Readings from Last 1 Encounters:  08/15/19 5' (1.524 m)    Weight:   Wt Readings from Last 1 Encounters:  08/15/19 53.3 kg    Ideal Body Weight:  45.5 kg  BMI:  Body mass index is 22.95 kg/m.  Estimated Nutritional Needs:   Kcal:  1600-1800  Protein:  65-80 grams  Fluid:  >1.6 L/day  Maylon Peppers RD, LDN, CNSC [phone] Pager [phone] After Hours Pager

## 2019-08-16 NOTE — Progress Notes (Signed)
PHARMACY NOTE:  ANTIMICROBIAL RENAL DOSAGE ADJUSTMENT  Current antimicrobial regimen includes a mismatch between antimicrobial dosage and estimated renal function.  As per policy approved by the Pharmacy & Therapeutics and Medical Executive Committees, the antimicrobial dosage will be adjusted accordingly.  Current antimicrobial dosage:  Levaquin 750mg  IV Q24H  Indication: COPD exac  Renal Function:  Estimated Creatinine Clearance: 44.3 mL/min (by C-G formula based on SCr of 0.78 mg/dL). []      On intermittent HD, scheduled: []      On CRRT    Antimicrobial dosage has been changed to:  Levaquin 500mg  IV Q24H  Jasmine [name], PharmD, BCPS, [place] 08/16/2019, 10:36 AM

## 2019-08-16 NOTE — Evaluation (Signed)
Occupational Therapy Evaluation Patient Details Name: Jasmine [name] MRN: [id number] DOB: 02/15/1946 Today's Date: 08/16/2019    History of Present Illness 73 yo female presenting with AMS, weakness, and SOB for several days. Testing COVID negative.  PMH including COPD on 3L home O2, anxiety, asthma, lung cancer, depression, HTN, osteoporosis, and lumbar radiculapathy (2009).   Clinical Impression   PTA, pt was living with her significant other and was independent with ADLs and IADLs; reports she uses RW for functional mobility in community. Pt currently requiring Min Guard A for LB ADLs and functional mobility with and without RW. Pt presenting with decreased activity tolerance as seen by decrease in SpO2 during grooming at sink. Pt SpO2 dropping to 86% on 4L O2 duirng grooming; elevated to 6L O2 and pt maintaining SpO2 at 96-91%. Pt would benefit from further acute OT to facilitate safe dc. Recommend dc to home once medically stable per physician.      Follow Up Recommendations  No OT follow up;Supervision/Assistance - 24 hour    Equipment Recommendations  None recommended by OT    Recommendations for Other Services PT consult     Precautions / Restrictions Precautions Precautions: Fall Precaution Comments: Watch SpO2       Mobility Bed Mobility Overal bed mobility: Needs Assistance Bed Mobility: Supine to Sit     Supine to sit: Min guard     General bed mobility comments: Min Guard A for safety  Transfers Overall transfer level: Needs assistance Equipment used: None Transfers: Sit to/from Stand Sit to Stand: Min guard         General transfer comment: Min Guard A for safety    Balance Overall balance assessment: Mild deficits observed, not formally tested                                         ADL either performed or assessed with clinical judgement   ADL Overall ADL's : Needs assistance/impaired Eating/Feeding: Independent;Sitting    Grooming: Set up;Supervision/safety;Standing;Oral care(shaving her face) Grooming Details (indicate cue type and reason): Close supervision for safety Upper Body Bathing: Set up;Supervision/ safety;Sitting   Lower Body Bathing: Min guard;Sit to/from stand   Upper Body Dressing : Set up;Supervision/safety;Sitting   Lower Body Dressing: Min guard;Sit to/from stand Lower Body Dressing Details (indicate cue type and reason): Min Guard A for safety. Pt able to adjust socks while sitting at [employer] Transfer: Min guard;Ambulation;BSC Toilet Transfer Details (indicate cue type and reason): Min GUard A for safety         Functional mobility during ADLs: Min guard;Rolling walker General ADL Comments: Pt demonstrating decreased activity tolerance as seen by decreased SpO2 during grooming tasks at sink. Pt also reporting slight SOB but stating she feels able to standing and perform ADLs     Vision Baseline Vision/History: Wears glasses Wears Glasses: At all times Patient Visual Report: No change from baseline;Other (comment)(Pt reporing her glasses are at home)       Perception     Praxis      Pertinent Vitals/Pain Pain Assessment: No/denies pain     Hand Dominance Right   Extremity/Trunk Assessment Upper Extremity Assessment Upper Extremity Assessment: Generalized weakness(Noting tremors with activity)   Lower Extremity Assessment Lower Extremity Assessment: Defer to PT evaluation   Cervical / Trunk Assessment Cervical / Trunk Assessment: Normal   Communication Communication Communication: No difficulties  Cognition Arousal/Alertness: Awake/alert Behavior During Therapy: WFL for tasks assessed/performed Overall Cognitive Status: Within Functional Limits for tasks assessed                                 General Comments: Noting some difficulty with problem sovling, but feel this is close to baseline and age appropiate.   General Comments  Upon arrival,  pt Spo2 98% on 5L O2 whils supien in bed. SpO2 dropping to 86% on 4L O2 at sink during grooming. Elevating to 6L O2 and pt maintaining SpO2 at 96-91%.    Exercises     Shoulder Instructions      Home Living Family/patient expects to be discharged to:: Private residence Living Arrangements: Spouse/significant other Available Help at Discharge: Friend(s)(Significant other) Type of Home: House Home Access: Ramped entrance     Home Layout: One level     Bathroom Shower/Tub: Tub/shower unit;Walk-in shower(Pt prefers to use tub)   Bathroom Toilet: Standard     Home Equipment: Environmental consultant - 4 wheels;Walker - 2 wheels;Shower seat   Additional Comments: 3L home O2      Prior Functioning/Environment Level of Independence: Independent        Comments: ADLs, IADLs, driving        OT Problem List: Decreased strength;Decreased activity tolerance;Impaired balance (sitting and/or standing);Decreased knowledge of use of DME or AE;Decreased knowledge of precautions      OT Treatment/Interventions: Self-care/ADL training;Therapeutic exercise;Energy conservation;DME and/or AE instruction;Therapeutic activities;Patient/family education    OT Goals(Current goals can be found in the care plan section) Acute Rehab OT Goals Patient Stated Goal: "Go home" OT Goal Formulation: With patient Time For Goal Achievement: 08/30/19 Potential to Achieve Goals: Good  OT Frequency: Min 2X/week   Barriers to D/C:            Co-evaluation              AM-PAC OT "6 Clicks" Daily Activity     Outcome Measure Help from another person eating meals?: None Help from another person taking care of personal grooming?: A Little Help from another person toileting, which includes using toliet, bedpan, or urinal?: A Little Help from another person bathing (including washing, rinsing, drying)?: A Little Help from another person to put on and taking off regular upper body clothing?: None Help from another  person to put on and taking off regular lower body clothing?: A Little 6 Click Score: 20   End of Session Equipment Utilized During Treatment: Gait belt;Rolling walker;Oxygen(6-4L) Nurse Communication: Mobility status  Activity Tolerance: Patient tolerated treatment well Patient left: (with PT in hallway)  OT Visit Diagnosis: Unsteadiness on feet (R26.81);Other abnormalities of gait and mobility (R26.89);Muscle weakness (generalized) (M62.81)                Time: [id number] OT Time Calculation (min): 37 min Charges:  OT General Charges $OT Visit: 1 Visit OT Evaluation $OT Eval Low Complexity: 1 Low OT Treatments $Self Care/Home Management : 8-22 mins  [place], OTR/L Acute Rehab Pager: [phone] Office: [place] 08/16/2019, 9:57 AM

## 2019-08-16 NOTE — Evaluation (Signed)
Physical Therapy Evaluation Patient Details Name: Jasmine [name] MRN: [id number] DOB: [birth year] Today's Date: 08/16/2019   History of Present Illness  73 yo female presenting with AMS, weakness, and SOB for several days. Testing COVID negative.  PMH including COPD on 3L home O2, anxiety, asthma, lung cancer, depression, HTN, osteoporosis, and lumbar radiculapathy (2009).  Clinical Impression  PTA pt living with significant other in single story home with ramped entrance. Pt ambulates in her home with assist of the furniture and uses a RW for community ambulation. Pt is limited in safe mobility by oxygen desaturation (see general comments) in presence of generalized weakness. Pt is currently min guard for bed mobility, transfers and ambulation of 100 feet with RW. Pt requires 1x standing rest break to recover O2 on 6L supplemental O2. PT will continue to follow pt acutely to work on weaning back to baseline oxygen requirement of 3L O2 via [place], however pt will not need any additional PT services at discharge.     Follow Up Recommendations No PT follow up;Supervision/Assistance - 24 hour    Equipment Recommendations  None recommended by PT    Recommendations for Other Services       Precautions / Restrictions Precautions Precautions: Fall Precaution Comments: Watch SpO2       Mobility  Bed Mobility Overal bed mobility: Needs Assistance Bed Mobility: Supine to Sit     Supine to sit: Min guard     General bed mobility comments: Min Guard A for safety  Transfers Overall transfer level: Needs assistance Equipment used: None Transfers: Sit to/from Stand Sit to Stand: Min guard         General transfer comment: Min Guard A for safety  Ambulation/Gait Ambulation/Gait assistance: Counsellor (Feet): 100 Feet Assistive device: Rolling walker (2 wheeled) Gait Pattern/deviations: Step-through pattern;Decreased step length - right;Decreased step length - left Gait  velocity: slowed Gait velocity interpretation: 1.31 - 2.62 ft/sec, indicative of limited community ambulator General Gait Details: min guard for slow, steady ambulation, started ambulation without AD and pt states "I need walker to walk in hall."         Balance Overall balance assessment: Mild deficits observed, not formally tested                                           Pertinent Vitals/Pain Pain Assessment: No/denies pain    Home Living Family/patient expects to be discharged to:: Private residence Living Arrangements: Spouse/significant other Available Help at Discharge: Friend(s)(Significant other) Type of Home: House Home Access: Ramped entrance     Home Layout: One level Home Equipment: Environmental consultant - 4 wheels;Walker - 2 wheels;Shower seat Additional Comments: 3L home O2    Prior Function Level of Independence: Independent         Comments: ADLs, IADLs, driving     Hand Dominance   Dominant Hand: Right    Extremity/Trunk Assessment   Upper Extremity Assessment Upper Extremity Assessment: Defer to OT evaluation    Lower Extremity Assessment Lower Extremity Assessment: Generalized weakness(generalized tremors noted)    Cervical / Trunk Assessment Cervical / Trunk Assessment: Normal  Communication   Communication: No difficulties  Cognition Arousal/Alertness: Awake/alert Behavior During Therapy: WFL for tasks assessed/performed Overall Cognitive Status: Within Functional Limits for tasks assessed  General Comments: Noting some difficulty with problem sovling, but feel this is close to baseline and age appropiate.      General Comments General comments (skin integrity, edema, etc.): With ambulation on 6L O2 SaO2 dropped to 88%O2, however with standing rest break and vc in pursed lipped breathing quickly rebounded to 92%O2, which pt able to maintain with ambulation back to room, once seated in  room returned to 4L O2 and SaO2 96%O2        Assessment/Plan    PT Assessment Patient needs continued PT services  PT Problem List Cardiopulmonary status limiting activity;Decreased strength       PT Treatment Interventions DME instruction;Gait training;Functional mobility training;Therapeutic activities;Therapeutic exercise;Balance training;Cognitive remediation;Patient/family education    PT Goals (Current goals can be found in the Care Plan section)  Acute Rehab PT Goals Patient Stated Goal: "Go home" PT Goal Formulation: With patient Time For Goal Achievement: 08/30/19 Potential to Achieve Goals: Good    Frequency Min 3X/week    AM-PAC PT "6 Clicks" Mobility  Outcome Measure Help needed turning from your back to your side while in a flat bed without using bedrails?: None Help needed moving from lying on your back to sitting on the side of a flat bed without using bedrails?: None Help needed moving to and from a bed to a chair (including a wheelchair)?: None Help needed standing up from a chair using your arms (e.g., wheelchair or bedside chair)?: None Help needed to walk in hospital room?: None Help needed climbing 3-5 steps with a railing? : A Little 6 Click Score: 23    End of Session Equipment Utilized During Treatment: Gait belt;Oxygen Activity Tolerance: Patient tolerated treatment well Patient left: in chair;with call bell/phone within reach Nurse Communication: Mobility status PT Visit Diagnosis: Difficulty in walking, not elsewhere classified (R26.2);Muscle weakness (generalized) (M62.81)    Time: [id number] PT Time Calculation (min) (ACUTE ONLY): 10 min   Charges:   PT Evaluation $PT Eval Moderate Complexity: 1 Mod          Darell Saputo B. Migdalia Dk PT, DPT Acute Rehabilitation Services Pager [phone] Office [phone]   [place] 08/16/2019, 10:30 AM

## 2019-08-16 NOTE — Progress Notes (Signed)
Hospitalist progress note  If 7PM-7AM,  night-coverage-look on AMION -prefer pages-not epic chat,please  Jasmine [name]  [id number] DOB: [birth year] DOA: 08/14/2019 PCP: Biagio Borg, MD  Narrative:  41 community dwelling black female COPD 3 L and supposed to be on trilogy vent-On 18/6 full facemask 35% FiO2 at home, diastolic heart failure cor pulmonale last echo 12/17/2018 EF 65-70% depression on Celexa hypothyroidism, ?  Bilateral lung masses in the past in 2017?  Crack cocaine use  Admit 10/25 hypercarbic respiratory failure CO2 in the 90 range lethargic and placed on BiPAP  Assessment & Plan: Hypercarbic respiratory failure-on trilogy vent 18/6 Stage III COPD on 3 L Change Solu-Medrol-->prednisone 60 daily DuoNeb Dulera and Breo Ellipta-continue Levaquin for now Does not require BiPAP  Needing about 6 L of oxygen with ambulation and ADLs Monitor over next several days but suspect she will be able to discharge home by midweek Prior cocaine abuse- drug screen negative HFpEF history EF 65-70% 2/20- Lasix 40 held-not on beta-blocker etc. at home, continue aspirin 81 Depression Continue Celexa 20 daily  DVT prophylaxis: Lovenox  Code Status:   Called     Family Communication:   Called and updated HCPOA Ms [name]-10/27 Disposition Plan: inpatient to SDU  Consultants:   none Procedures:   no Antimicrobials:   levaquin  Subjective:  Much more awake coherent pleasant no distress Asking to eat and soft diet has been ordered Was up with therapy earlier today No specific new complaint no sputum no wheeze  Objective: Vitals:   08/16/19 1240 08/16/19 1310 08/16/19 1320 08/16/19 1330  BP:      Pulse:      Resp: 18 (!) 23 14 14   Temp:      TempSrc:      SpO2: 100% 100% 100% 97%  Weight:      Height:        Intake/Output Summary (Last 24 hours) at 08/16/2019 1338 Last data filed at 08/16/2019 1300 Gross per 24 hour  Intake 350 ml  Output -  Net 350 ml    Filed Weights   08/15/19 2140  Weight: 53.3 kg    Examination: Frail cachectic + tachypneic although better than before s1 s2 sinus arrythmia-possible murmur Posterolaterally no added sounds no rhonchi no rales Abd soft nt nd no rebound no guard Neuro intact with no deficit  Data Reviewed: I have personally reviewed following labs and imaging studies CBC: Recent Labs  Lab 08/14/19 1354 08/15/19 0200 08/15/19 0500 08/15/19 1142 08/16/19 0426  WBC 4.5  --  4.3  --  6.1  NEUTROABS 3.6  --   --   --  5.3  HGB 15.5* 14.6 12.9 13.6 12.4  HCT 52.3* 43.0 43.9 40.0 39.6  MCV 106.5*  --  105.3*  --  100.0  PLT 125*  --  122*  --  [id number]*   Basic Metabolic Panel: Recent Labs  Lab 08/14/19 1354 08/15/19 0200 08/15/19 0500 08/15/19 1142 08/16/19 0426  NA 139 138 143 137 138  K 4.8 4.4 4.7 3.8 4.2  CL 84*  --  92*  --  89*  CO2 41*  --  40*  --  38*  GLUCOSE 135*  --  123*  --  119*  BUN 10  --  15  --  15  CREATININE 0.66  --  0.83  --  0.78  CALCIUM 9.7  --  9.3  --  9.2   GFR: Estimated Creatinine Clearance: 44.3 mL/min (by  C-G formula based on SCr of 0.78 mg/dL). Liver Function Tests: Recent Labs  Lab 08/14/19 1354 08/15/19 0500  AST 26 18  ALT 17 15  ALKPHOS 54 39  BILITOT 0.8 0.5  PROT 8.1 6.7  ALBUMIN 4.4 3.6   No results for input(s): LIPASE, AMYLASE in the last 168 hours. No results for input(s): AMMONIA in the last 168 hours. Coagulation Profile: No results for input(s): INR, PROTIME in the last 168 hours. Cardiac Enzymes: Radiology Studies: Reviewed images personally in health database  Scheduled Meds: . aspirin EC  81 mg Oral Daily  . enoxaparin (LOVENOX) injection  40 mg Subcutaneous Q24H  . feeding supplement (ENSURE ENLIVE)  237 mL Oral BID BM  . fluticasone furoate-vilanterol  1 puff Inhalation Daily  . influenza vaccine adjuvanted  0.5 mL Intramuscular Tomorrow-1000  . ipratropium  2 puff Inhalation Once  . ipratropium-albuterol  3 mL  Nebulization TID  . levothyroxine  75 mcg Oral Daily  . methylPREDNISolone (SOLU-MEDROL) injection  40 mg Intravenous Q6H  . umeclidinium bromide  1 puff Inhalation Daily   Continuous Infusions: . levofloxacin (LEVAQUIN) IV      LOS: 2 days   Time spent: [place], MD [facility]  08/16/2019, 1:38 PM

## 2019-08-16 NOTE — Progress Notes (Signed)
Chaplain visited with patient as a result of spiritual consult for prayer from the nurse.  Jasmine [name] showed a tremendous use of spirituality for coping with their health challenges.  The chaplain provided care by listening to her and giving her an opportunity to have someone to talk with.  The chaplain prayed with her.  The chaplain will follow-up as needed.

## 2019-08-17 DIAGNOSIS — J432 Centrilobular emphysema: Secondary | ICD-10-CM | POA: Diagnosis not present

## 2019-08-17 DIAGNOSIS — J9622 Acute and chronic respiratory failure with hypercapnia: Secondary | ICD-10-CM | POA: Diagnosis not present

## 2019-08-17 DIAGNOSIS — E440 Moderate protein-calorie malnutrition: Secondary | ICD-10-CM | POA: Diagnosis not present

## 2019-08-17 LAB — CBC WITH DIFFERENTIAL/PLATELET
Abs Immature Granulocytes: 0.02 10*3/uL (ref 0.00–0.07)
Basophils Absolute: 0 10*3/uL (ref 0.0–0.1)
Basophils Relative: 0 %
Eosinophils Absolute: 0 10*3/uL (ref 0.0–0.5)
Eosinophils Relative: 0 %
HCT: 43.3 % (ref 36.0–46.0)
Hemoglobin: 13.3 g/dL (ref 12.0–15.0)
Immature Granulocytes: 0 %
Lymphocytes Relative: 8 %
Lymphs Abs: 0.5 10*3/uL — ABNORMAL LOW (ref 0.7–4.0)
MCH: 30.9 pg (ref 26.0–34.0)
MCHC: 30.7 g/dL (ref 30.0–36.0)
MCV: 100.7 fL — ABNORMAL HIGH (ref 80.0–100.0)
Monocytes Absolute: 0.6 10*3/uL (ref 0.1–1.0)
Monocytes Relative: 8 %
Neutro Abs: 5.9 10*3/uL (ref 1.7–7.7)
Neutrophils Relative %: 84 %
Platelets: 165 10*3/uL (ref 150–400)
RBC: 4.3 MIL/uL (ref 3.87–5.11)
RDW: 13.9 % (ref 11.5–15.5)
WBC: 7.1 10*3/uL (ref 4.0–10.5)
nRBC: 0 % (ref 0.0–0.2)

## 2019-08-17 NOTE — TOC Initial Note (Signed)
Transition of Care [facility]) - Initial/Assessment Note    Patient Details  Name: Jasmine [name] MRN: [id number] Date of Birth: 08-10-1946  Transition of Care [facility]) CM/SW Contact:    Maryclare Labrador, RN Phone Number: 08/17/2019, 1:14 PM  Clinical Narrative:      PTA from home with ex-husband - per pt her ex-husband is an awesome support person and that he will be with her at discharge as recommended.  Pt informed CM that she has a working triology in the home (pt calls the equipment her BiPAP), pt also confirms she has home oxygen supplied by [employer].  Pt would benefit from [facility] and aide in the home.  Pt informed she will have portable oxygen tank for discharge home and that she plans to transport via private vehicle.  Pt is interested in [facility] - CM reviewed [facility] list with her per medicare.gov - pt chose [facility] accepted referral pending ordersl.  CM requested [place] orders              Expected Discharge Plan: [place] Barriers to Discharge: Continued Medical Work up   Patient Goals and CMS Choice Patient states their goals for this hospitalization and ongoing recovery are:: Pt states she is ready to get home and use her bipap correctly so she can stay out of the hospital      Expected Discharge Plan and Services Expected Discharge Plan: [place]       Living arrangements for the past 2 months: Single Family Home                                      Prior Living Arrangements/Services Living arrangements for the past 2 months: Single Family Home Lives with:: Other (Comment)(ex husband) Patient language and need for interpreter reviewed:: No Do you feel safe going back to the place where you live?: Yes      Need for Family Participation in Patient Care: Yes (Comment) Care giver support system in place?: Yes (comment)(Pt speaks very kindly of her exhusband - he lives in the home with her and she said "it was just a blessing that he came  back into my life, I dont think I could have made it withou him") Current home services: DME(Trilogy and Continous Oxygen both supplied by [employer]) Criminal Activity/Legal Involvement Pertinent to Current Situation/Hospitalization: No - Comment as needed  Activities of Daily Living Home Assistive Devices/Equipment: BIPAP, Oxygen, Blood pressure cuff, Scales ADL Screening (condition at time of admission) Patient's cognitive ability adequate to safely complete daily activities?: Yes Is the patient deaf or have difficulty hearing?: No Does the patient have difficulty seeing, even when wearing glasses/contacts?: No Does the patient have difficulty concentrating, remembering, or making decisions?: No Patient able to express need for assistance with ADLs?: Yes Does the patient have difficulty dressing or bathing?: No Independently performs ADLs?: Yes (appropriate for developmental age) Does the patient have difficulty walking or climbing stairs?: Yes Weakness of Legs: Both Weakness of Arms/Hands: None  Permission Sought/Granted                  Emotional Assessment   Attitude/Demeanor/Rapport: Charismatic, Gracious, Engaged Affect (typically observed): Accepting, Adaptable Orientation: : Oriented to Self, Oriented to Place, Oriented to  Time, Oriented to Situation   Psych Involvement: No (comment)  Admission diagnosis:  Acute respiratory failure with hypoxia ([place]) [  J96.01] Patient Active Problem List   Diagnosis Date Noted  . Malnutrition of moderate degree 08/16/2019  . Type II respiratory failure ([place]) 08/14/2019  . Acute respiratory failure with hypoxia and hypercarbia ([place]) 03/07/2019  . Respiratory acidosis 03/07/2019  . Respiratory failure ([place]) 12/16/2018  . Preventative health care 09/20/2018  . Edema due to congestive heart failure ([place]) 07/27/2018  . Palliative care by specialist   . Shortness of breath   . Chronic low back pain 04/30/2018  . COPD with acute  exacerbation ([place])   . Hypothyroidism   . Anxiety   . Respiratory failure with hypercapnia ([place]) 03/24/2018  . Hypercarbia 03/22/2018  . Polysubstance abuse ([place]) 01/26/2018  . Aspiration into airway 06/03/2017  . Rash 07/12/2016  . Bilateral hearing loss 06/05/2016  . Hyperglycemia 05/21/2016  . Abnormal LFTs 03/24/2016  . Elevated blood sugar 03/24/2016  . Solitary pulmonary nodule 01/09/2016  . Chronic respiratory failure with hypoxia ([place])   . On home oxygen therapy   . Chronic diastolic congestive heart failure ([place])   . Bacteremia Step viridans 12/27/2015  . Tobacco abuse disorder 12/25/2015  . Hypothyroidism following radioiodine therapy 09/26/2014  . Protein-calorie malnutrition, severe ([place]) 06/23/2014  . Cor pulmonale ([place]) 01/12/2013  . COPD (chronic obstructive pulmonary disease) ([place]) 11/29/2011  . Macrocytosis without anemia 11/09/2011  . Palliative care encounter 04/09/2011  . SINUSITIS, CHRONIC 10/10/2010  . FATIGUE 10/10/2010  . Depression with anxiety 07/21/2008  . Essential hypertension 05/01/2007  . ALLERGIC RHINITIS 05/01/2007  . ASTHMA 05/01/2007  . OSTEOPOROSIS 05/01/2007   PCP:  Biagio Borg, MD Pharmacy:   [facility], [address] [place] [address] Phone: [phone] Fax: [phone]     Social Determinants of Health (SDOH) Interventions    Readmission Risk Interventions Readmission Risk Prevention Plan 08/17/2019 03/10/2019  Transportation Screening Complete Complete  Medication Review (RN Care Manager) - Complete  PCP or Specialist appointment within 3-5 days of discharge - Complete  HRI or [place] - Complete  SW Recovery Care/Counseling Consult - Complete  [place] - Not Applicable  Some recent data might be hidden

## 2019-08-17 NOTE — Progress Notes (Signed)
PROGRESS NOTE  Jasmine [name] [id number] DOB: Mar 17, 1946 DOA: 08/14/2019 PCP: Biagio Borg, MD  HPI/Recap of past 24 hours: HPI from Dr Verlon Au 73 yr old female with hx of COPD on 3L andsupposed to be ontrilogy vent (18/6 full facemask, 35% [id number] home), diastolic heart failure, cor pulmonale (last echo on 12/17/2018 EF 65-70%), depression on Celexa, hypothyroidism, ??Hx of Bilateral lung masses in the past in 2017, polysubstance abuse, admitted on 10/25 with hypercarbic respiratory failure, CO2 in the 90 range, lethargic.  Patient admitted for further management.   Today, patient reports some improvement, noted to still be wheezing, requiring about 4 L of O2, plan to wean off.  Denies any chest pain, abdominal pain, nausea/vomiting, fever/chills, cough.    Assessment/Plan: Principal Problem:   Type II respiratory failure (HCC) Active Problems:   COPD (chronic obstructive pulmonary disease) (HCC)   Tobacco abuse disorder   On home oxygen therapy   Polysubstance abuse (HCC)   Respiratory acidosis   Malnutrition of moderate degree  Acute on chronic hypoxic/hypercarbic respiratory failure likely 2/2 exacerbation of COPD Currently requiring about 6 L of supplemental oxygen upon ambulation and ADLs, baseline around 3 L Afebrile, no leukocytosis Chest x-ray showed platelike scarring versus atelectasis at the extreme left lung base. Otherwise no active disease in the chest Started on Levaquin for COPD exacerbation, will continue for 3 doses Continue prednisone, inhalers, DuoNeb PT/OT Monitor closely, plan to wean off O2  Chronic diastolic HF Appears euvolemic Last echo on 11/2018 showed EF of 65 to 70% Hold home as needed Lasix for now  Hypothyroidism Continue Synthroid  Depression Continue Celexa  History of polysubstance abuse UDS negative  Moderate malnutrition Dietitian consulted        Malnutrition Type:  Nutrition Problem: Moderate Malnutrition  Etiology: chronic illness, decreased appetite(COPD)   Malnutrition Characteristics:  Signs/Symptoms: energy intake < 75% for > or equal to 1 month, moderate muscle depletion   Nutrition Interventions:  Interventions: Ensure Enlive (each supplement provides 350kcal and 20 grams of protein)    Estimated body mass index is 22.95 kg/m as calculated from the following:   Height as of this encounter: 5' (1.524 m).   Weight as of this encounter: 53.3 kg.       Code Status: Partial code  Family Communication: None at bedside  Disposition Plan: Likely Home   Consultants:  None  Procedures:  None  Antimicrobials:  Levaquin  DVT prophylaxis: Lovenox   Objective: Vitals:   08/16/19 2259 08/17/19 0308 08/17/19 0718 08/17/19 0817  BP: 123/68 128/74  123/70  Pulse: 88     Resp:    16  Temp: 98.5 F (36.9 C) 98.5 F (36.9 C)  97.6 F (36.4 C)  TempSrc: Axillary Oral  Oral  SpO2:   100% 100%  Weight:      Height:        Intake/Output Summary (Last 24 hours) at 08/17/2019 1210 Last data filed at 08/17/2019 0900 Gross per 24 hour  Intake 443.8 ml  Output -  Net 443.8 ml   Filed Weights   08/15/19 2140  Weight: 53.3 kg    Exam:  General: NAD, frail, cachectic  Cardiovascular: S1, S2 present  Respiratory:  Decreased air entry bilaterally, with bilateral wheezing noted  Abdomen: Soft, nontender, nondistended, bowel sounds present  Musculoskeletal: No bilateral pedal edema noted  Skin: Normal  Psychiatry: Normal mood   Data Reviewed: CBC: Recent Labs  Lab 08/14/19 1354 08/15/19 0200 08/15/19 0500 08/15/19 1142  08/16/19 0426 08/17/19 0403  WBC 4.5  --  4.3  --  6.1 7.1  NEUTROABS 3.6  --   --   --  5.3 5.9  HGB 15.5* 14.6 12.9 13.6 12.4 13.3  HCT 52.3* 43.0 43.9 40.0 39.6 43.3  MCV 106.5*  --  105.3*  --  100.0 100.7*  PLT 125*  --  122*  --  145* [id number]   Basic Metabolic Panel: Recent Labs  Lab 08/14/19 1354 08/15/19 0200 08/15/19  0500 08/15/19 1142 08/16/19 0426 08/16/19 1643  NA 139 138 143 137 138 137  K 4.8 4.4 4.7 3.8 4.2 4.1  CL 84*  --  92*  --  89* 86*  CO2 41*  --  40*  --  38* 34*  GLUCOSE 135*  --  123*  --  119* 204*  BUN 10  --  15  --  15 18  CREATININE 0.66  --  0.83  --  0.78 0.90  CALCIUM 9.7  --  9.3  --  9.2 10.0   GFR: Estimated Creatinine Clearance: 39.4 mL/min (by C-G formula based on SCr of 0.9 mg/dL). Liver Function Tests: Recent Labs  Lab 08/14/19 1354 08/15/19 0500 08/16/19 1643  AST 26 18 29   ALT 17 15 13   ALKPHOS 54 39 44  BILITOT 0.8 0.5 QUANTITY NOT SUFFICIENT, UNABLE TO PERFORM TEST  PROT 8.1 6.7 7.5  ALBUMIN 4.4 3.6 3.9   No results for input(s): LIPASE, AMYLASE in the last 168 hours. No results for input(s): AMMONIA in the last 168 hours. Coagulation Profile: No results for input(s): INR, PROTIME in the last 168 hours. Cardiac Enzymes: No results for input(s): CKTOTAL, CKMB, CKMBINDEX, TROPONINI in the last 168 hours. BNP (last 3 results) No results for input(s): PROBNP in the last 8760 hours. HbA1C: No results for input(s): HGBA1C in the last 72 hours. CBG: No results for input(s): GLUCAP in the last 168 hours. Lipid Profile: No results for input(s): CHOL, HDL, LDLCALC, TRIG, CHOLHDL, LDLDIRECT in the last 72 hours. Thyroid Function Tests: No results for input(s): TSH, T4TOTAL, FREET4, T3FREE, THYROIDAB in the last 72 hours. Anemia Panel: No results for input(s): VITAMINB12, FOLATE, FERRITIN, TIBC, IRON, RETICCTPCT in the last 72 hours. Urine analysis:    Component Value Date/Time   COLORURINE YELLOW 08/15/2019 0258   APPEARANCEUR CLEAR 08/15/2019 0258   LABSPEC >1.030 (H) 08/15/2019 0258   PHURINE 6.5 08/15/2019 0258   GLUCOSEU NEGATIVE 08/15/2019 0258   GLUCOSEU NEGATIVE 09/20/2018 1426   HGBUR SMALL (A) 08/15/2019 0258   BILIRUBINUR NEGATIVE 08/15/2019 0258   BILIRUBINUR negative 08/11/2011 1358   KETONESUR 15 (A) 08/15/2019 0258   PROTEINUR 100  (A) 08/15/2019 0258   UROBILINOGEN 0.2 09/20/2018 1426   NITRITE NEGATIVE 08/15/2019 0258   LEUKOCYTESUR NEGATIVE 08/15/2019 0258   Sepsis Labs: @LABRCNTIP (procalcitonin:4,lacticidven:4)  ) Recent Results (from the past 240 hour(s))  SARS Coronavirus 2 by RT PCR (hospital order, performed in [place] hospital lab) Nasopharyngeal Nasopharyngeal Swab     Status: None   Collection Time: 08/14/19  1:59 PM   Specimen: Nasopharyngeal Swab  Result Value Ref Range Status   SARS Coronavirus 2 NEGATIVE NEGATIVE Final    Comment: (NOTE) If result is NEGATIVE SARS-CoV-2 target nucleic acids are NOT DETECTED. The SARS-CoV-2 RNA is generally detectable in upper and lower  respiratory specimens during the acute phase of infection. The lowest  concentration of SARS-CoV-2 viral copies this assay can detect is 250  copies / mL. A  negative result does not preclude SARS-CoV-2 infection  and should not be used as the sole basis for treatment or other  patient management decisions.  A negative result may occur with  improper specimen collection / handling, submission of specimen other  than nasopharyngeal swab, presence of viral mutation(s) within the  areas targeted by this assay, and inadequate number of viral copies  (<250 copies / mL). A negative result must be combined with clinical  observations, patient history, and epidemiological information. If result is POSITIVE SARS-CoV-2 target nucleic acids are DETECTED. The SARS-CoV-2 RNA is generally detectable in upper and lower  respiratory specimens dur ing the acute phase of infection.  Positive  results are indicative of active infection with SARS-CoV-2.  Clinical  correlation with patient history and other diagnostic information is  necessary to determine patient infection status.  Positive results do  not rule out bacterial infection or co-infection with other viruses. If result is PRESUMPTIVE POSTIVE SARS-CoV-2 nucleic acids MAY BE  PRESENT.   A presumptive positive result was obtained on the submitted specimen  and confirmed on repeat testing.  While 2019 novel coronavirus  (SARS-CoV-2) nucleic acids may be present in the submitted sample  additional confirmatory testing may be necessary for epidemiological  and / or clinical management purposes  to differentiate between  SARS-CoV-2 and other Sarbecovirus currently known to infect humans.  If clinically indicated additional testing with an alternate test  methodology [phone]) is advised. The SARS-CoV-2 RNA is generally  detectable in upper and lower respiratory sp ecimens during the acute  phase of infection. The expected result is Negative. Fact Sheet for Patients:  StrictlyIdeas.no Fact Sheet for Healthcare Providers: BankingDealers.co.za This test is not yet approved or cleared by the [place] FDA and has been authorized for detection and/or diagnosis of SARS-CoV-2 by FDA under an Emergency Use Authorization (EUA).  This EUA will remain in effect (meaning this test can be used) for the duration of the COVID-19 declaration under Section 564(b)(1) of the Act, 21 U.S.C. section 360bbb-3(b)(1), unless the authorization is terminated or revoked sooner. Performed at [facility], [place] [address] [place], [place] [address]   Urine culture     Status: None   Collection Time: 08/15/19  2:58 AM   Specimen: Urine, Clean Catch  Result Value Ref Range Status   Specimen Description URINE, CLEAN CATCH  Final   Special Requests NONE  Final   Culture   Final    NO GROWTH Performed at [facility], [place] [address] [place], [place] [address]    Report Status 08/15/2019 FINAL  Final      Studies: No results found.  Scheduled Meds: . aspirin EC  81 mg Oral Daily  . enoxaparin (LOVENOX) injection  40 mg Subcutaneous Q24H  . feeding supplement (ENSURE ENLIVE)  237 mL Oral BID BM  . fluticasone  furoate-vilanterol  1 puff Inhalation Daily  . influenza vaccine adjuvanted  0.5 mL Intramuscular Tomorrow-1000  . ipratropium  2 puff Inhalation Once  . ipratropium-albuterol  3 mL Nebulization TID  . levothyroxine  75 mcg Oral Daily  . predniSONE  60 mg Oral QAC breakfast  . umeclidinium bromide  1 puff Inhalation Daily    Continuous Infusions: . levofloxacin (LEVAQUIN) IV 500 mg (08/16/19 1738)     LOS: 3 days     Alma Friendly, MD [facility]  If 7PM-7AM, please contact night-coverage [email or web address] 08/17/2019, 12:10 PM

## 2019-08-17 NOTE — Care Management Important Message (Signed)
Important Message  Patient Details  Name: RAIGAN BOGA MRN: [id number] Date of Birth: 1946-02-15   Medicare Important Message Given:  Yes     Orbie Pyo 08/17/2019, 1:43 PM

## 2019-08-17 NOTE — Discharge Instructions (Signed)
[facility] Stay Proper nutrition can help your body recover from illness and injury.   Foods and beverages high in protein, vitamins, and minerals help rebuild muscle loss, promote healing, & reduce fall risk.   In addition to eating healthy foods, a nutrition shake is an easy, delicious way to get the nutrition you need during and after your hospital stay  It is recommended that you continue to drink 2 bottles per day of:       Ensure plus or carnation instant breakfast for at least 1 month (30 days) after your hospital stay   Tips for adding a nutrition shake into your routine: As allowed, drink one with vitamins or medications instead of water or juice Enjoy one as a tasty mid-morning or afternoon snack Drink cold or make a milkshake out of it Drink one instead of milk with cereal or snacks Use as a coffee creamer   Available at the following grocery stores and pharmacies:           * [facility] [phone]            For COUPONS visit: www.ensure.com/join or http://dawson-may.com/   Suggested Substitutions Ensure Plus = Boost Plus = Carnation Breakfast Essentials = Boost Compact Ensure Active Clear = Boost Breeze Glucerna Shake = Boost Glucose Control = Carnation Breakfast Essentials SUGAR FREE

## 2019-08-17 NOTE — Plan of Care (Signed)

## 2019-08-18 DIAGNOSIS — J432 Centrilobular emphysema: Secondary | ICD-10-CM | POA: Diagnosis not present

## 2019-08-18 DIAGNOSIS — E440 Moderate protein-calorie malnutrition: Secondary | ICD-10-CM | POA: Diagnosis not present

## 2019-08-18 DIAGNOSIS — J9622 Acute and chronic respiratory failure with hypercapnia: Secondary | ICD-10-CM | POA: Diagnosis not present

## 2019-08-18 LAB — BASIC METABOLIC PANEL
Anion gap: 9 (ref 5–15)
BUN: 17 mg/dL (ref 8–23)
CO2: 39 mmol/L — ABNORMAL HIGH (ref 22–32)
Calcium: 9.4 mg/dL (ref 8.9–10.3)
Chloride: 88 mmol/L — ABNORMAL LOW (ref 98–111)
Creatinine, Ser: 0.66 mg/dL (ref 0.44–1.00)
GFR calc Af Amer: >60 mL/min (ref >60)
GFR calc non Af Amer: >60 mL/min (ref >60)
Glucose, Bld: 100 mg/dL — ABNORMAL HIGH (ref 70–99)
Potassium: 3.7 mmol/L (ref 3.5–5.1)
Sodium: 136 mmol/L (ref 135–145)

## 2019-08-18 LAB — CBC WITH DIFFERENTIAL/PLATELET
Abs Immature Granulocytes: 0.03 10*3/uL (ref 0.00–0.07)
Basophils Absolute: 0 10*3/uL (ref 0.0–0.1)
Basophils Relative: 0 %
Eosinophils Absolute: 0 10*3/uL (ref 0.0–0.5)
Eosinophils Relative: 1 %
HCT: 40.7 % (ref 36.0–46.0)
Hemoglobin: 12.8 g/dL (ref 12.0–15.0)
Immature Granulocytes: 0 %
Lymphocytes Relative: 26 %
Lymphs Abs: 1.9 10*3/uL (ref 0.7–4.0)
MCH: 31.4 pg (ref 26.0–34.0)
MCHC: 31.4 g/dL (ref 30.0–36.0)
MCV: 100 fL (ref 80.0–100.0)
Monocytes Absolute: 0.9 10*3/uL (ref 0.1–1.0)
Monocytes Relative: 12 %
Neutro Abs: 4.6 10*3/uL (ref 1.7–7.7)
Neutrophils Relative %: 61 %
Platelets: 163 10*3/uL (ref 150–400)
RBC: 4.07 MIL/uL (ref 3.87–5.11)
RDW: 14.1 % (ref 11.5–15.5)
WBC: 7.4 10*3/uL (ref 4.0–10.5)
nRBC: 0 % (ref 0.0–0.2)

## 2019-08-18 MED ORDER — LEVOFLOXACIN 500 MG PO TABS
500.0000 mg | ORAL_TABLET | Freq: Once | ORAL | Status: AC
  Administered 2019-08-18: 500 mg via ORAL
  Filled 2019-08-18: qty 1

## 2019-08-18 NOTE — Progress Notes (Signed)
Physical Therapy Treatment Patient Details Name: Jasmine [name] MRN: [id number] DOB: 03/26/1946 Today's Date: 08/18/2019    History of Present Illness 73 yo female presenting with AMS, weakness, and SOB for several days. Testing COVID negative.  PMH including COPD on 3L home O2, anxiety, asthma, lung cancer, depression, HTN, osteoporosis, and lumbar radiculapathy (2009).    PT Comments    On entry pt sitting EoB eager to walk with therapy. Pt is limited in safe mobility by cardiopulmonary response to mobility (see General Comments) as her HR reached a max of 142 bpm with ambulation and her O2 desaturates with ambulation on 3L supplemental O2 via [place]. Pt able to maintain SaO2 on 4L O2, pt educated on need to monitor her O2 especially with ambulation. Pt asymptomatic with O2 drop and HR increase. Pt is min guard for safety with her mobility. D/c plans remain appropriate when medically cleared. PT will continue to follow acutely.     Follow Up Recommendations  No PT follow up;Supervision/Assistance - 24 hour     Equipment Recommendations  None recommended by PT       Precautions / Restrictions Precautions Precautions: Fall Precaution Comments: Watch SpO2  Restrictions Weight Bearing Restrictions: No    Mobility  Bed Mobility               General bed mobility comments: sitting EoB on entry   Transfers Overall transfer level: Needs assistance Equipment used: None Transfers: Sit to/from Stand Sit to Stand: Min guard         General transfer comment: Min Guard A for safety  Ambulation/Gait Ambulation/Gait assistance: Counsellor (Feet): 220 Feet Assistive device: Rolling walker (2 wheeled) Gait Pattern/deviations: Step-through pattern;Decreased step length - right;Decreased step length - left Gait velocity: slowed Gait velocity interpretation: 1.31 - 2.62 ft/sec, indicative of limited community ambulator General Gait Details: min guard for slow, steady  ambulation, vc for proximity to RW as she is used to a Rollator and for decreased talking with ambulation to maintain SaO2         Balance Overall balance assessment: Mild deficits observed, not formally tested                                          Cognition Arousal/Alertness: Awake/alert Behavior During Therapy: WFL for tasks assessed/performed Overall Cognitive Status: Within Functional Limits for tasks assessed                                           General Comments General comments (skin integrity, edema, etc.): on arrival pt SaO2 98%O2 on 3L O2, with standing and talking to therapist SaO2 dropped to 86%O2, increased supplemental O2 to 4L for ambulation and pt able to maintain SaO2 greater than 92%O2, at rest pt HR 118bpm, BP 108/65, with ambulation HR reached a max of 142bpm, pt asymptomatic, no SoB, returned to room where HR returned to 110s and BP 140/81      Pertinent Vitals/Pain Pain Assessment: No/denies pain           PT Goals (current goals can now be found in the care plan section) Acute Rehab PT Goals Patient Stated Goal: "Go home" PT Goal Formulation: With patient Time For Goal Achievement: 08/30/19 Potential to Achieve Goals: Good Progress  towards PT goals: Progressing toward goals    Frequency    Min 3X/week      PT Plan Current plan remains appropriate       AM-PAC PT "6 Clicks" Mobility   Outcome Measure  Help needed turning from your back to your side while in a flat bed without using bedrails?: None Help needed moving from lying on your back to sitting on the side of a flat bed without using bedrails?: None Help needed moving to and from a bed to a chair (including a wheelchair)?: None Help needed standing up from a chair using your arms (e.g., wheelchair or bedside chair)?: None Help needed to walk in hospital room?: None Help needed climbing 3-5 steps with a railing? : A Little 6 Click Score:  23    End of Session Equipment Utilized During Treatment: Gait belt;Oxygen Activity Tolerance: Patient tolerated treatment well Patient left: in chair;with call bell/phone within reach Nurse Communication: Mobility status PT Visit Diagnosis: Difficulty in walking, not elsewhere classified (R26.2);Muscle weakness (generalized) (M62.81)     Time: [id number] PT Time Calculation (min) (ACUTE ONLY): 27 min  Charges:  $Gait Training: 23-37 mins                     Jolin Benavides B. Migdalia Dk PT, DPT Acute Rehabilitation Services Pager [phone] Office [phone]    [place] 08/18/2019, 3:37 PM

## 2019-08-18 NOTE — Progress Notes (Signed)
PROGRESS NOTE  Jasmine [name] [id number] DOB: [birth year] DOA: 08/14/2019 PCP: Biagio Borg, MD  HPI/Recap of past 24 hours: HPI from Dr Verlon Au 73 yr old female with hx of COPD on 3L andsupposed to be ontrilogy vent (18/6 full facemask, 35% [id number] home), diastolic heart failure, cor pulmonale (last echo on 12/17/2018 EF 65-70%), depression on Celexa, hypothyroidism, ??Hx of Bilateral lung masses in the past in 2017, polysubstance abuse, admitted on 10/25 with hypercarbic respiratory failure, CO2 in the 90 range, lethargic.  Patient admitted for further management.   Today, patient denies any new complaints, noted to require about 4 L of oxygen to maintain sats during ambulation, with heart rate in the 140s during ambulation, patient asymptomatic, will reevaluate patient's overall status especially on ambulation tomorrow    Assessment/Plan: Principal Problem:   Type II respiratory failure (HCC) Active Problems:   COPD (chronic obstructive pulmonary disease) (HCC)   Tobacco abuse disorder   On home oxygen therapy   Polysubstance abuse (HCC)   Respiratory acidosis   Malnutrition of moderate degree  Acute on chronic hypoxic/hypercarbic respiratory failure likely 2/2 exacerbation of COPD Currently requiring about 4L of supplemental oxygen upon ambulation and ADLs, baseline around 3 L Afebrile, no leukocytosis Chest x-ray showed platelike scarring versus atelectasis at the extreme left lung base. Otherwise no active disease in the chest Completed Levaquin for COPD exacerbation for 3 doses Continue prednisone, inhalers, DuoNeb PT/OT Monitor closely, plan to wean down O2 back to baseline  Chronic diastolic HF Appears euvolemic Last echo on 11/2018 showed EF of 65 to 70% Hold home as needed Lasix for now  Hypothyroidism Continue Synthroid  Depression Continue Celexa  History of polysubstance abuse UDS negative  Moderate malnutrition Dietitian consulted         Malnutrition Type:  Nutrition Problem: Moderate Malnutrition Etiology: chronic illness, decreased appetite(COPD)   Malnutrition Characteristics:  Signs/Symptoms: energy intake < 75% for > or equal to 1 month, moderate muscle depletion   Nutrition Interventions:  Interventions: Ensure Enlive (each supplement provides 350kcal and 20 grams of protein)    Estimated body mass index is 22.95 kg/m as calculated from the following:   Height as of this encounter: 5' (1.524 m).   Weight as of this encounter: 53.3 kg.       Code Status: Partial code  Family Communication: None at bedside  Disposition Plan: Likely Home on 08/19/2019   Consultants:  None  Procedures:  None  Antimicrobials:  Completed Levaquin  DVT prophylaxis: Lovenox   Objective: Vitals:   08/18/19 0847 08/18/19 1151 08/18/19 1423 08/18/19 1521  BP:  113/63  138/74  Pulse:  84  (!) 109  Resp:  15  17  Temp:  98.4 F (36.9 C)  98.2 F (36.8 C)  TempSrc:  Oral  Oral  SpO2: 92% 100% 96% 96%  Weight:      Height:       No intake or output data in the 24 hours ending 08/18/19 1714 Filed Weights   08/15/19 2140  Weight: 53.3 kg    Exam:  General: NAD, frail, cachectic  Cardiovascular: S1, S2 present  Respiratory:  Diminished air entry bilaterally  Abdomen: Soft, nontender, nondistended, bowel sounds present  Musculoskeletal: No bilateral pedal edema noted  Skin: Normal  Psychiatry: Normal mood   Data Reviewed: CBC: Recent Labs  Lab 08/14/19 1354  08/15/19 0500 08/15/19 1142 08/16/19 0426 08/17/19 0403 08/18/19 0436  WBC 4.5  --  4.3  --  6.1  7.1 7.4  NEUTROABS 3.6  --   --   --  5.3 5.9 4.6  HGB 15.5*   < > 12.9 13.6 12.4 13.3 12.8  HCT 52.3*   < > 43.9 40.0 39.6 43.3 40.7  MCV 106.5*  --  105.3*  --  100.0 100.7* 100.0  PLT 125*  --  122*  --  145* 165 163   < > = values in this interval not displayed.   Basic Metabolic Panel: Recent Labs  Lab 08/14/19 1354   08/15/19 0500 08/15/19 1142 08/16/19 0426 08/16/19 1643 08/18/19 0436  NA 139   < > 143 137 138 137 136  K 4.8   < > 4.7 3.8 4.2 4.1 3.7  CL 84*  --  92*  --  89* 86* 88*  CO2 41*  --  40*  --  38* 34* 39*  GLUCOSE 135*  --  123*  --  119* 204* 100*  BUN 10  --  15  --  15 18 17   CREATININE 0.66  --  0.83  --  0.78 0.90 0.66  CALCIUM 9.7  --  9.3  --  9.2 10.0 9.4   < > = values in this interval not displayed.   GFR: Estimated Creatinine Clearance: 44.3 mL/min (by C-G formula based on SCr of 0.66 mg/dL). Liver Function Tests: Recent Labs  Lab 08/14/19 1354 08/15/19 0500 08/16/19 1643  AST 26 18 29   ALT 17 15 13   ALKPHOS 54 39 44  BILITOT 0.8 0.5 QUANTITY NOT SUFFICIENT, UNABLE TO PERFORM TEST  PROT 8.1 6.7 7.5  ALBUMIN 4.4 3.6 3.9   No results for input(s): LIPASE, AMYLASE in the last 168 hours. No results for input(s): AMMONIA in the last 168 hours. Coagulation Profile: No results for input(s): INR, PROTIME in the last 168 hours. Cardiac Enzymes: No results for input(s): CKTOTAL, CKMB, CKMBINDEX, TROPONINI in the last 168 hours. BNP (last 3 results) No results for input(s): PROBNP in the last 8760 hours. HbA1C: No results for input(s): HGBA1C in the last 72 hours. CBG: No results for input(s): GLUCAP in the last 168 hours. Lipid Profile: No results for input(s): CHOL, HDL, LDLCALC, TRIG, CHOLHDL, LDLDIRECT in the last 72 hours. Thyroid Function Tests: No results for input(s): TSH, T4TOTAL, FREET4, T3FREE, THYROIDAB in the last 72 hours. Anemia Panel: No results for input(s): VITAMINB12, FOLATE, FERRITIN, TIBC, IRON, RETICCTPCT in the last 72 hours. Urine analysis:    Component Value Date/Time   COLORURINE YELLOW 08/15/2019 0258   APPEARANCEUR CLEAR 08/15/2019 0258   LABSPEC >1.030 (H) 08/15/2019 0258   PHURINE 6.5 08/15/2019 0258   GLUCOSEU NEGATIVE 08/15/2019 0258   GLUCOSEU NEGATIVE 09/20/2018 1426   HGBUR SMALL (A) 08/15/2019 0258   BILIRUBINUR NEGATIVE  08/15/2019 0258   BILIRUBINUR negative 08/11/2011 1358   KETONESUR 15 (A) 08/15/2019 0258   PROTEINUR 100 (A) 08/15/2019 0258   UROBILINOGEN 0.2 09/20/2018 1426   NITRITE NEGATIVE 08/15/2019 0258   LEUKOCYTESUR NEGATIVE 08/15/2019 0258   Sepsis Labs: @LABRCNTIP (procalcitonin:4,lacticidven:4)  ) Recent Results (from the past 240 hour(s))  SARS Coronavirus 2 by RT PCR (hospital order, performed in [place] hospital lab) Nasopharyngeal Nasopharyngeal Swab     Status: None   Collection Time: 08/14/19  1:59 PM   Specimen: Nasopharyngeal Swab  Result Value Ref Range Status   SARS Coronavirus 2 NEGATIVE NEGATIVE Final    Comment: (NOTE) If result is NEGATIVE SARS-CoV-2 target nucleic acids are NOT DETECTED. The SARS-CoV-2 RNA is  generally detectable in upper and lower  respiratory specimens during the acute phase of infection. The lowest  concentration of SARS-CoV-2 viral copies this assay can detect is 250  copies / mL. A negative result does not preclude SARS-CoV-2 infection  and should not be used as the sole basis for treatment or other  patient management decisions.  A negative result may occur with  improper specimen collection / handling, submission of specimen other  than nasopharyngeal swab, presence of viral mutation(s) within the  areas targeted by this assay, and inadequate number of viral copies  (<250 copies / mL). A negative result must be combined with clinical  observations, patient history, and epidemiological information. If result is POSITIVE SARS-CoV-2 target nucleic acids are DETECTED. The SARS-CoV-2 RNA is generally detectable in upper and lower  respiratory specimens dur ing the acute phase of infection.  Positive  results are indicative of active infection with SARS-CoV-2.  Clinical  correlation with patient history and other diagnostic information is  necessary to determine patient infection status.  Positive results do  not rule out bacterial infection  or co-infection with other viruses. If result is PRESUMPTIVE POSTIVE SARS-CoV-2 nucleic acids MAY BE PRESENT.   A presumptive positive result was obtained on the submitted specimen  and confirmed on repeat testing.  While 2019 novel coronavirus  (SARS-CoV-2) nucleic acids may be present in the submitted sample  additional confirmatory testing may be necessary for epidemiological  and / or clinical management purposes  to differentiate between  SARS-CoV-2 and other Sarbecovirus currently known to infect humans.  If clinically indicated additional testing with an alternate test  methodology [id number]) is advised. The SARS-CoV-2 RNA is generally  detectable in upper and lower respiratory sp ecimens during the acute  phase of infection. The expected result is Negative. Fact Sheet for Patients:  StrictlyIdeas.no Fact Sheet for Healthcare Providers: BankingDealers.co.za This test is not yet approved or cleared by the [place] FDA and has been authorized for detection and/or diagnosis of SARS-CoV-2 by FDA under an Emergency Use Authorization (EUA).  This EUA will remain in effect (meaning this test can be used) for the duration of the COVID-19 declaration under Section 564(b)(1) of the Act, 21 U.S.C. section 360bbb-3(b)(1), unless the authorization is terminated or revoked sooner. Performed at [facility], [place] [address] [place], [place] [address]   Urine culture     Status: None   Collection Time: 08/15/19  2:58 AM   Specimen: Urine, Clean Catch  Result Value Ref Range Status   Specimen Description URINE, CLEAN CATCH  Final   Special Requests NONE  Final   Culture   Final    NO GROWTH Performed at [facility], [place] [address], [place], [place] [address]    Report Status 08/15/2019 FINAL  Final      Studies: No results found.  Scheduled Meds: . aspirin EC  81 mg Oral Daily  . enoxaparin (LOVENOX) injection  40 mg  Subcutaneous Q24H  . feeding supplement (ENSURE ENLIVE)  237 mL Oral BID BM  . fluticasone furoate-vilanterol  1 puff Inhalation Daily  . influenza vaccine adjuvanted  0.5 mL Intramuscular Tomorrow-1000  . ipratropium  2 puff Inhalation Once  . ipratropium-albuterol  3 mL Nebulization TID  . levothyroxine  75 mcg Oral Daily  . predniSONE  60 mg Oral QAC breakfast  . umeclidinium bromide  1 puff Inhalation Daily    Continuous Infusions:    LOS: 4 days  Alma Friendly, MD [facility]  If 7PM-7AM, please contact night-coverage [email or web address] 08/18/2019, 5:14 PM

## 2019-08-19 DIAGNOSIS — J9622 Acute and chronic respiratory failure with hypercapnia: Secondary | ICD-10-CM | POA: Diagnosis not present

## 2019-08-19 DIAGNOSIS — E440 Moderate protein-calorie malnutrition: Secondary | ICD-10-CM | POA: Diagnosis not present

## 2019-08-19 DIAGNOSIS — J432 Centrilobular emphysema: Secondary | ICD-10-CM | POA: Diagnosis not present

## 2019-08-19 MED ORDER — PREDNISONE 10 MG PO TABS: ORAL_TABLET | ORAL | 0 refills | Status: DC

## 2019-08-19 NOTE — Discharge Summary (Signed)
Discharge Summary  Jasmine [name] [id number] DOB: May 23, 1946  PCP: Biagio Borg, MD  Admit date: 08/14/2019 Discharge date: 08/19/2019  Time spent: 40 mins  Recommendations for Outpatient Follow-up:  1. PCP in 1 week  Discharge Diagnoses:  Active Hospital Problems   Diagnosis Date Noted  . Type II respiratory failure ([place]) 08/14/2019  . Malnutrition of moderate degree 08/16/2019  . Respiratory acidosis 03/07/2019  . Polysubstance abuse ([place]) 01/26/2018  . On home oxygen therapy   . Tobacco abuse disorder 12/25/2015  . COPD (chronic obstructive pulmonary disease) ([place]) 11/29/2011    Resolved Hospital Problems  No resolved problems to display.    Discharge Condition: Stable  Diet recommendation: As tolerated  Vitals:   08/19/19 0843 08/19/19 1126  BP: 124/61 114/61  Pulse:  90  Resp:  17  Temp: 97.9 F (36.6 C) 98.5 F (36.9 C)  SpO2:  100%    History of present illness:  73 yr old female with hx of COPD on 3L andsupposed to be ontrilogy vent (18/6 full facemask, 35% [id number] home), diastolic heart failure, cor pulmonale (last echo on 12/17/2018 EF 65-70%), depression on Celexa, hypothyroidism, ??Hx of Bilateral lung masses in the past in 2017, polysubstance abuse, admitted on 10/25 with hypercarbic respiratory failure, CO2 in the 90 range, lethargic.  Patient admitted for further management.    Today, pt denies any new complaints, was able to ambulate today, maintaining her sats and heart rate going up to about the 120s which I believe is around her baseline.  At rest heart rate within normal limits.  Patient stable for discharge with follow-up with her PCP in 1 week.  Hospital Course:  Principal Problem:   Type II respiratory failure (HCC) Active Problems:   COPD (chronic obstructive pulmonary disease) (HCC)   Tobacco abuse disorder   On home oxygen therapy   Polysubstance abuse (HCC)   Respiratory acidosis   Malnutrition of moderate degree  Acute  on chronic hypoxic/hypercarbic respiratory failure likely 2/2 exacerbation of COPD Currently requiring oxygen at baseline around 3 L Afebrile, no leukocytosis Chest x-ray showed platelike scarring versus atelectasis at the extreme left lung base. Otherwise no active disease in the chest Completed Levaquin for COPD exacerbation for 3 doses Discharge on tapered dose of prednisone, inhalers, home nebs PT/OT-no further follow-up Follow-up with PCP in 1 week  Chronic diastolic HF Appears euvolemic Last echo on 11/2018 showed EF of 65 to 70% Resume home as needed Lasix for now  Hypothyroidism Continue Synthroid  Depression Continue Celexa  History of polysubstance abuse UDS negative  Moderate malnutrition Dietitian consulted          Malnutrition Type:  Nutrition Problem: Moderate Malnutrition Etiology: chronic illness, decreased appetite(COPD)   Malnutrition Characteristics:  Signs/Symptoms: energy intake < 75% for > or equal to 1 month, moderate muscle depletion   Nutrition Interventions:  Interventions: Ensure Enlive (each supplement provides 350kcal and 20 grams of protein)   Estimated body mass index is 22.95 kg/m as calculated from the following:   Height as of this encounter: 5' (1.524 m).   Weight as of this encounter: 53.3 kg.    Procedures:  None  Consultations:  None  Discharge Exam: BP 114/61 (BP Location: Left Arm)   Pulse 90   Temp 98.5 F (36.9 C) (Oral)   Resp 17   Ht 5' (1.524 m)   Wt 53.3 kg   SpO2 100%   BMI 22.95 kg/m   General: NAD, frail, chronically ill-appearing Cardiovascular:  S1, S2 present Respiratory: Diminished breath sounds bilaterally  Discharge Instructions You were cared for by a hospitalist during your hospital stay. If you have any questions about your discharge medications or the care you received while you were in the hospital after you are discharged, you can call the unit and asked to speak with the  hospitalist on call if the hospitalist that took care of you is not available. Once you are discharged, your primary care physician will handle any further medical issues. Please note that NO REFILLS for any discharge medications will be authorized once you are discharged, as it is imperative that you return to your primary care physician (or establish a relationship with a primary care physician if you do not have one) for your aftercare needs so that they can reassess your need for medications and monitor your lab values.  Discharge Instructions    Diet - low sodium heart healthy   Complete by: As directed    Increase activity slowly   Complete by: As directed      Allergies as of 08/19/2019      Reactions   Alendronate Sodium Other (See Comments)   Patient does not remember this reaction      Medication List    TAKE these medications   albuterol (2.5 MG/3ML) 0.083% nebulizer solution Commonly known as: PROVENTIL Take 3 mLs (2.5 mg total) by nebulization every 6 (six) hours as needed for wheezing or shortness of breath. What changed: Another medication with the same name was changed. Make sure you understand how and when to take each.   Ventolin HFA 108 (90 Base) MCG/ACT inhaler Generic drug: albuterol INHALE 1 TO 2 PUFFS INTO THE LUNGS EVERY 6 (SIX) HOURS AS NEEDED FOR WHEEZING OR SHORTNESS OF BREATH. What changed: See the new instructions.   aspirin 81 MG EC tablet Take 1 tablet (81 mg total) by mouth daily.   atorvastatin 10 MG tablet Commonly known as: Lipitor Take 1 tablet (10 mg total) by mouth daily.   citalopram 20 MG tablet Commonly known as: CELEXA TAKE 1 TABLET (20 MG TOTAL) BY MOUTH DAILY.   fluticasone furoate-vilanterol 200-25 MCG/INH Aepb Commonly known as: BREO ELLIPTA Inhale 1 puff into the lungs daily.   furosemide 40 MG tablet Commonly known as: Lasix 1 tab by mouth in the AM daily, with 1 tab in the PM as needed for persistent swelling What changed:    how much to take  how to take this  when to take this  additional instructions   levothyroxine 75 MCG tablet Commonly known as: SYNTHROID TAKE 1 TABLET BY MOUTH DAILY BEFORE BREAKFAST. What changed: See the new instructions.   OXYGEN Inhale 3 L into the lungs. 24/7   potassium chloride 10 MEQ tablet Commonly known as: KLOR-CON TAKE 1 TABLET BY MOUTH 2 TIMES DAILY.   predniSONE 10 MG tablet Commonly known as: DELTASONE Take 4 tabs daily for 3 days, then 3 tabs daily for 3 days, then 2 tabs daily for 3 days, then 1 tab daily for 3 days and complete   Spiriva HandiHaler 18 MCG inhalation capsule Generic drug: tiotropium PLACE 1 CAPSULE (18 MCG TOTAL) INTO INHALER AND INHALE DAILY. What changed: See the new instructions.   traMADol 50 MG tablet Commonly known as: ULTRAM Take 1 tablet (50 mg total) by mouth 2 (two) times daily as needed. What changed: reasons to take this      Allergies  Allergen Reactions  . Alendronate Sodium Other (See  Comments)    Patient does not remember this reaction   Follow-up Information    Biagio Borg, MD. Schedule an appointment as soon as possible for a visit in 1 week(s).   Specialties: Internal Medicine, Radiology Contact information: [address] [place] [address] [phone]            The results of significant diagnostics from this hospitalization (including imaging, microbiology, ancillary and laboratory) are listed below for reference.    Significant Diagnostic Studies: Ct Head Wo Contrast  Result Date: 08/14/2019 CLINICAL DATA:  Altered mental status EXAM: CT HEAD WITHOUT CONTRAST TECHNIQUE: Contiguous axial images were obtained from the base of the skull through the vertex without intravenous contrast. COMPARISON:  December 16, 2018 FINDINGS: Brain: Ventricles and sulci appear within normal limits for age, stable. There is no intracranial mass, hemorrhage, extra-axial fluid collection, or midline  shift. Brain parenchyma appears unremarkable and stable. No acute infarct evident. Vascular: No hyperdense vessel. There is calcification in the carotid siphon regions, stable. Skull: Bony calvarium appears intact. Ossification along the falx is stable. Sinuses/Orbits: There is mucosal thickening in multiple ethmoid air cells. Other visualized paranasal sinuses are clear. Orbits appear symmetric bilaterally. Other: Mastoid air cells are clear. IMPRESSION: Foci of ethmoid sinus mucosal thickening. Foci of carotid artery calcification. Brain parenchyma appears normal. No acute infarct. No mass or hemorrhage. Electronically Signed   By: Lowella Grip III M.D.   On: 08/14/2019 15:52   Dg Chest Portable 1 View  Result Date: 08/14/2019 CLINICAL DATA:  Altered mental status EXAM: PORTABLE CHEST 1 VIEW COMPARISON:  03/06/2019 chest radiograph. FINDINGS: Stable cardiomediastinal silhouette with normal heart size. No pneumothorax. No pleural effusion. Platelike scarring versus atelectasis at the extreme left lung base. No pulmonary edema. No acute consolidative airspace disease. IMPRESSION: Platelike scarring versus atelectasis at the extreme left lung base. Otherwise no active disease in the chest. Electronically Signed   By: Ilona Sorrel M.D.   On: 08/14/2019 14:34    Microbiology: Recent Results (from the past 240 hour(s))  SARS Coronavirus 2 by RT PCR (hospital order, performed in [facility] hospital lab) Nasopharyngeal Nasopharyngeal Swab     Status: None   Collection Time: 08/14/19  1:59 PM   Specimen: Nasopharyngeal Swab  Result Value Ref Range Status   SARS Coronavirus 2 NEGATIVE NEGATIVE Final    Comment: (NOTE) If result is NEGATIVE SARS-CoV-2 target nucleic acids are NOT DETECTED. The SARS-CoV-2 RNA is generally detectable in upper and lower  respiratory specimens during the acute phase of infection. The lowest  concentration of SARS-CoV-2 viral copies this assay can detect is 250  copies  / mL. A negative result does not preclude SARS-CoV-2 infection  and should not be used as the sole basis for treatment or other  patient management decisions.  A negative result may occur with  improper specimen collection / handling, submission of specimen other  than nasopharyngeal swab, presence of viral mutation(s) within the  areas targeted by this assay, and inadequate number of viral copies  (<250 copies / mL). A negative result must be combined with clinical  observations, patient history, and epidemiological information. If result is POSITIVE SARS-CoV-2 target nucleic acids are DETECTED. The SARS-CoV-2 RNA is generally detectable in upper and lower  respiratory specimens dur ing the acute phase of infection.  Positive  results are indicative of active infection with SARS-CoV-2.  Clinical  correlation with patient history and other diagnostic information is  necessary to  determine patient infection status.  Positive results do  not rule out bacterial infection or co-infection with other viruses. If result is PRESUMPTIVE POSTIVE SARS-CoV-2 nucleic acids MAY BE PRESENT.   A presumptive positive result was obtained on the submitted specimen  and confirmed on repeat testing.  While 2019 novel coronavirus  (SARS-CoV-2) nucleic acids may be present in the submitted sample  additional confirmatory testing may be necessary for epidemiological  and / or clinical management purposes  to differentiate between  SARS-CoV-2 and other Sarbecovirus currently known to infect humans.  If clinically indicated additional testing with an alternate test  methodology [phone]) is advised. The SARS-CoV-2 RNA is generally  detectable in upper and lower respiratory sp ecimens during the acute  phase of infection. The expected result is Negative. Fact Sheet for Patients:  StrictlyIdeas.no Fact Sheet for Healthcare Providers: BankingDealers.co.za This test  is not yet approved or cleared by the [place] FDA and has been authorized for detection and/or diagnosis of SARS-CoV-2 by FDA under an Emergency Use Authorization (EUA).  This EUA will remain in effect (meaning this test can be used) for the duration of the COVID-19 declaration under Section 564(b)(1) of the Act, 21 U.S.C. section 360bbb-3(b)(1), unless the authorization is terminated or revoked sooner. Performed at [facility], [place] [address], [place], [place] [address]   Urine culture     Status: None   Collection Time: 08/15/19  2:58 AM   Specimen: Urine, Clean Catch  Result Value Ref Range Status   Specimen Description URINE, CLEAN CATCH  Final   Special Requests NONE  Final   Culture   Final    NO GROWTH Performed at [facility], [place] [address], [place], [place] [address]    Report Status 08/15/2019 FINAL  Final     Labs: Basic Metabolic Panel: Recent Labs  Lab 08/14/19 1354  08/15/19 0500 08/15/19 1142 08/16/19 0426 08/16/19 1643 08/18/19 0436  NA 139   < > 143 137 138 137 136  K 4.8   < > 4.7 3.8 4.2 4.1 3.7  CL 84*  --  92*  --  89* 86* 88*  CO2 41*  --  40*  --  38* 34* 39*  GLUCOSE 135*  --  123*  --  119* 204* 100*  BUN 10  --  15  --  15 18 17   CREATININE 0.66  --  0.83  --  0.78 0.90 0.66  CALCIUM 9.7  --  9.3  --  9.2 10.0 9.4   < > = values in this interval not displayed.   Liver Function Tests: Recent Labs  Lab 08/14/19 1354 08/15/19 0500 08/16/19 1643  AST 26 18 29   ALT 17 15 13   ALKPHOS 54 39 44  BILITOT 0.8 0.5 QUANTITY NOT SUFFICIENT, UNABLE TO PERFORM TEST  PROT 8.1 6.7 7.5  ALBUMIN 4.4 3.6 3.9   No results for input(s): LIPASE, AMYLASE in the last 168 hours. No results for input(s): AMMONIA in the last 168 hours. CBC: Recent Labs  Lab 08/14/19 1354  08/15/19 0500 08/15/19 1142 08/16/19 0426 08/17/19 0403 08/18/19 0436  WBC 4.5  --  4.3  --  6.1 7.1 7.4  NEUTROABS 3.6  --   --   --  5.3 5.9 4.6  HGB 15.5*    < > 12.9 13.6 12.4 13.3 12.8  HCT 52.3*   < > 43.9 40.0 39.6 43.3 40.7  MCV 106.5*  --  105.3*  --  100.0 100.7* 100.0  PLT 125*  --  122*  --  145* 165 163   < > = values in this interval not displayed.   Cardiac Enzymes: No results for input(s): CKTOTAL, CKMB, CKMBINDEX, TROPONINI in the last 168 hours. BNP: BNP (last 3 results) Recent Labs    12/16/18 1120 03/06/19 2322 08/14/19 1354  BNP 72.1 37.2 30.8    ProBNP (last 3 results) No results for input(s): PROBNP in the last 8760 hours.  CBG: No results for input(s): GLUCAP in the last 168 hours.     Signed:  Alma Friendly, MD [facility] 08/19/2019, 11:46 AM

## 2019-08-19 NOTE — Progress Notes (Signed)
Ambulated with pt in hallway. Oxygen maintained above 90% on 3L [place]. HR max 120 bpm during ambulation.

## 2019-08-20 DIAGNOSIS — J441 Chronic obstructive pulmonary disease with (acute) exacerbation: Secondary | ICD-10-CM | POA: Diagnosis not present

## 2019-08-20 DIAGNOSIS — J9622 Acute and chronic respiratory failure with hypercapnia: Secondary | ICD-10-CM | POA: Diagnosis not present

## 2019-08-20 DIAGNOSIS — E440 Moderate protein-calorie malnutrition: Secondary | ICD-10-CM | POA: Diagnosis not present

## 2019-08-20 DIAGNOSIS — E872 Acidosis: Secondary | ICD-10-CM | POA: Diagnosis not present

## 2019-08-20 DIAGNOSIS — I2781 Cor pulmonale (chronic): Secondary | ICD-10-CM | POA: Diagnosis not present

## 2019-08-20 DIAGNOSIS — I5032 Chronic diastolic (congestive) heart failure: Secondary | ICD-10-CM | POA: Diagnosis not present

## 2019-08-20 DIAGNOSIS — E039 Hypothyroidism, unspecified: Secondary | ICD-10-CM | POA: Diagnosis not present

## 2019-08-20 DIAGNOSIS — J9621 Acute and chronic respiratory failure with hypoxia: Secondary | ICD-10-CM | POA: Diagnosis not present

## 2019-08-20 DIAGNOSIS — I6529 Occlusion and stenosis of unspecified carotid artery: Secondary | ICD-10-CM | POA: Diagnosis not present

## 2019-08-22 ENCOUNTER — Other Ambulatory Visit: Payer: Self-pay

## 2019-08-22 NOTE — Patient Outreach (Signed)
[place] [facility]) Care Management  08/22/2019  Jasmine [name] 26-Jul-1946 [id number]   Social work referral received from [facility], Jon Billings, to assist patient with resources for personal care services.  Attempted to contact patient today; no answer and voicemail box was full.  Mailed Unsuccessful Outreach Letter.  Will attempt to reach again within four business days.  Ronn Melena, BSW Social Worker [phone]

## 2019-08-22 NOTE — Patient Outreach (Signed)
[place] [facility]) Care Management  [place]  08/22/2019   Jasmine [name] Sep 24, 1946 [id number]  Subjective: Telephone call to patient for follow up.  Patient reports she is doing well since being home. She states she is using her BIPAP at night and oxygen during the day.  She states that Alvis Lemmings is involved.  Patient reports that she is needing some help around the home.  Discussed [facility] social work services to help with resources.  Patient is agreeable with social work for resources. Discussed with patient her COPD and when to notify physician.  She verbalized understanding.    Objective:   Encounter Medications:  Outpatient Encounter Medications as of 08/22/2019  Medication Sig  . albuterol (PROVENTIL) (2.5 MG/3ML) 0.083% nebulizer solution Take 3 mLs (2.5 mg total) by nebulization every 6 (six) hours as needed for wheezing or shortness of breath.  [name] aspirin EC 81 MG EC tablet Take 1 tablet (81 mg total) by mouth daily.  [name] atorvastatin (LIPITOR) 10 MG tablet Take 1 tablet (10 mg total) by mouth daily.  . citalopram (CELEXA) 20 MG tablet TAKE 1 TABLET (20 MG TOTAL) BY MOUTH DAILY.  . fluticasone furoate-vilanterol (BREO ELLIPTA) 200-25 MCG/INH AEPB Inhale 1 puff into the lungs daily.  . furosemide (LASIX) 40 MG tablet 1 tab by mouth in the AM daily, with 1 tab in the PM as needed for persistent swelling (Patient taking differently: Take 40 mg by mouth daily. )  . levothyroxine (SYNTHROID) 75 MCG tablet TAKE 1 TABLET BY MOUTH DAILY BEFORE BREAKFAST. (Patient taking differently: Take 75 mcg by mouth daily. )  . OXYGEN Inhale 3 L into the lungs. 24/7  . potassium chloride (K-DUR) 10 MEQ tablet TAKE 1 TABLET BY MOUTH 2 TIMES DAILY. (Patient taking differently: Take 10 mEq by mouth 2 (two) times daily. )  . predniSONE (DELTASONE) 10 MG tablet Take 4 tabs daily for 3 days, then 3 tabs daily for 3 days, then 2 tabs daily for 3 days, then 1 tab daily for 3 days and complete  .  SPIRIVA HANDIHALER 18 MCG inhalation capsule PLACE 1 CAPSULE (18 MCG TOTAL) INTO INHALER AND INHALE DAILY. (Patient taking differently: Place 18 mcg into inhaler and inhale daily. )  . traMADol (ULTRAM) 50 MG tablet Take 1 tablet (50 mg total) by mouth 2 (two) times daily as needed. (Patient taking differently: Take 50 mg by mouth 2 (two) times daily as needed for moderate pain. )  . VENTOLIN HFA 108 (90 Base) MCG/ACT inhaler INHALE 1 TO 2 PUFFS INTO THE LUNGS EVERY 6 (SIX) HOURS AS NEEDED FOR WHEEZING OR SHORTNESS OF BREATH. (Patient taking differently: Inhale 1-2 puffs into the lungs every 6 (six) hours as needed for wheezing or shortness of breath. )   No facility-administered encounter medications on file as of 08/22/2019.     Functional Status:  In your present state of health, do you have any difficulty performing the following activities: 08/22/2019 08/16/2019  Hearing? N N  Vision? N N  Difficulty concentrating or making decisions? N N  Walking or climbing stairs? Y Y  Comment shortness of breath -  Dressing or bathing? N N  Doing errands, shopping? N N  Preparing Food and eating ? N -  Using the Toilet? N -  In the past six months, have you accidently leaked urine? N -  Do you have problems with loss of bowel control? N -  Managing your Medications? N -  Managing your Finances?  N -  Housekeeping or managing your Housekeeping? N -  Some recent data might be hidden    Fall/Depression Screening: Fall Risk  08/22/2019 07/29/2019 03/28/2019  Falls in the past year? 0 0 0  Number falls in past yr: - 0 -  Comment - - -  Injury with Fall? - - -  Risk Factor Category  - - -  Risk for fall due to : - - -  Follow up - - -   PHQ 2/9 Scores 08/22/2019 07/29/2019 03/28/2019 11/29/2018 11/24/2018 09/20/2018 08/05/2018  PHQ - 2 Score 0 0 1 3 3  0 0  PHQ- 9 Score - - - - 8 - -    Assessment: Patient with recent hospitalization related to COPD  Plan:  [facility] CM Care Plan Problem One     Most Recent  Value  Care Plan Problem One  Knowledge deficit related to COPD  Role Documenting the Problem One  Care Management Telephonic [place] for Problem One  Active  [facility] Long Term Goal   Patient will continue to follow COPD regimen and not have any excerbations within the next 90 days.  [facility] Long Term Goal Start Date  08/22/19  Interventions for Problem One Long Term Goal  RN CM discussed COPD and importance of BIPAP and oxygen levels.  RN CM reviewed signs of COPD exacerbation.  [facility] CM Short Term Goal #1   Patient will not readmit to the hospital within the next 29 days.  [facility] CM Short Term Goal #1 Start Date  08/22/19  Interventions for Short Term Goal #1  RN CM discussed follow up with physician and COPD care.  [facility] CM Short Term Goal #2   Patient will make follow up with PCP within the next  14 days.  [facility] CM Short Term Goal #2 Start Date  08/22/19  Interventions for Short Term Goal #2  RN CM discussed importance of making follow up appointments.     RN CM will send update to physician. RN CM will outreach patient next week and patient agreeable.    [name], RN, MSN [place] [facility] Care Management Coordinator Direct Line [phone] Cell [phone] Toll Free: [phone]  Fax: [phone]

## 2019-08-24 ENCOUNTER — Telehealth: Payer: Self-pay | Admitting: Internal Medicine

## 2019-08-24 ENCOUNTER — Telehealth: Payer: Self-pay | Admitting: Pulmonary Disease

## 2019-08-24 DIAGNOSIS — J9621 Acute and chronic respiratory failure with hypoxia: Secondary | ICD-10-CM | POA: Diagnosis not present

## 2019-08-24 DIAGNOSIS — E872 Acidosis: Secondary | ICD-10-CM | POA: Diagnosis not present

## 2019-08-24 DIAGNOSIS — I5032 Chronic diastolic (congestive) heart failure: Secondary | ICD-10-CM | POA: Diagnosis not present

## 2019-08-24 DIAGNOSIS — I2781 Cor pulmonale (chronic): Secondary | ICD-10-CM | POA: Diagnosis not present

## 2019-08-24 DIAGNOSIS — I6529 Occlusion and stenosis of unspecified carotid artery: Secondary | ICD-10-CM | POA: Diagnosis not present

## 2019-08-24 DIAGNOSIS — J441 Chronic obstructive pulmonary disease with (acute) exacerbation: Secondary | ICD-10-CM | POA: Diagnosis not present

## 2019-08-24 DIAGNOSIS — E440 Moderate protein-calorie malnutrition: Secondary | ICD-10-CM | POA: Diagnosis not present

## 2019-08-24 DIAGNOSIS — E039 Hypothyroidism, unspecified: Secondary | ICD-10-CM | POA: Diagnosis not present

## 2019-08-24 DIAGNOSIS — J9622 Acute and chronic respiratory failure with hypercapnia: Secondary | ICD-10-CM | POA: Diagnosis not present

## 2019-08-24 NOTE — Telephone Encounter (Signed)
Called by Jasmine [name] d/t malfunction with home CPAP machine. She is a patient of Dr. Elsworth Soho. She has been trying to call [facility] and has not received a call back from [facility]. I recommended that she go to the [facility] office in the morning to see if they could fix her CPAP machine. If they can't fix her machine, she should call the [facility] office in the morning to enlist their help getting her machine fixed or replaced.

## 2019-08-24 NOTE — Telephone Encounter (Signed)
Shre with [facility] home health calling for verbal PT orders (he saw her on Sat first time) 1 wk 1 2 wk 2 1 wk 2 2 wk 1 1 wk 3  Shre cb [phone]

## 2019-08-24 NOTE — Telephone Encounter (Signed)
Verbal orders given via VM 

## 2019-08-25 ENCOUNTER — Other Ambulatory Visit: Payer: Self-pay

## 2019-08-25 ENCOUNTER — Ambulatory Visit: Payer: Self-pay

## 2019-08-25 ENCOUNTER — Telehealth: Payer: Self-pay | Admitting: Internal Medicine

## 2019-08-25 NOTE — Telephone Encounter (Signed)
Verbal orders given  

## 2019-08-25 NOTE — Telephone Encounter (Signed)
Jasmine [name] with Jasmine [name] requesting home health verbal order to add  skilled nursing for chronic disease management  and social worker for community resources  cb [phone]

## 2019-08-25 NOTE — Telephone Encounter (Signed)
Please ask DME to look into - she has a trilogy machine

## 2019-08-25 NOTE — Patient Outreach (Signed)
[place] [facility]) Care Management  08/25/2019  KIAHRA SEYBERT 05/08/1946 [id number]   Social work referral received from [facility], Jon Billings, to assist patient with resources for personal care services.  Second attempt to contact patient today; no answer and voicemail box was full.  Mailed Unsuccessful Outreach Letter on 08/22/19  Will attempt to reach again within four business days.  Ronn Melena, BSW Social Worker [phone]

## 2019-08-26 ENCOUNTER — Ambulatory Visit: Payer: Self-pay

## 2019-08-26 ENCOUNTER — Other Ambulatory Visit: Payer: Self-pay

## 2019-08-26 DIAGNOSIS — J449 Chronic obstructive pulmonary disease, unspecified: Secondary | ICD-10-CM | POA: Diagnosis not present

## 2019-08-26 DIAGNOSIS — E872 Acidosis: Secondary | ICD-10-CM | POA: Diagnosis not present

## 2019-08-26 DIAGNOSIS — J9622 Acute and chronic respiratory failure with hypercapnia: Secondary | ICD-10-CM | POA: Diagnosis not present

## 2019-08-26 DIAGNOSIS — J9621 Acute and chronic respiratory failure with hypoxia: Secondary | ICD-10-CM | POA: Diagnosis not present

## 2019-08-26 DIAGNOSIS — I6529 Occlusion and stenosis of unspecified carotid artery: Secondary | ICD-10-CM | POA: Diagnosis not present

## 2019-08-26 DIAGNOSIS — I5032 Chronic diastolic (congestive) heart failure: Secondary | ICD-10-CM | POA: Diagnosis not present

## 2019-08-26 DIAGNOSIS — J441 Chronic obstructive pulmonary disease with (acute) exacerbation: Secondary | ICD-10-CM | POA: Diagnosis not present

## 2019-08-26 DIAGNOSIS — I2781 Cor pulmonale (chronic): Secondary | ICD-10-CM | POA: Diagnosis not present

## 2019-08-26 DIAGNOSIS — E039 Hypothyroidism, unspecified: Secondary | ICD-10-CM | POA: Diagnosis not present

## 2019-08-26 DIAGNOSIS — E440 Moderate protein-calorie malnutrition: Secondary | ICD-10-CM | POA: Diagnosis not present

## 2019-08-26 NOTE — Patient Outreach (Signed)
[facility]) Care Management  08/26/2019  Jasmine [name] 1946-05-21 [id number]   Social work referral received from [facility], Jon Billings, to assist patient with resources for personal care services. Third attempt to contact patient today; no answer and voicemail box was full. Mailed Unsuccessful Outreach Letter on 08/22/19 Case closure if no response by 09/02/19.  Ronn Melena, BSW Social Worker [phone]

## 2019-08-28 DIAGNOSIS — E039 Hypothyroidism, unspecified: Secondary | ICD-10-CM | POA: Diagnosis not present

## 2019-08-28 DIAGNOSIS — I2781 Cor pulmonale (chronic): Secondary | ICD-10-CM | POA: Diagnosis not present

## 2019-08-28 DIAGNOSIS — E440 Moderate protein-calorie malnutrition: Secondary | ICD-10-CM | POA: Diagnosis not present

## 2019-08-28 DIAGNOSIS — J9622 Acute and chronic respiratory failure with hypercapnia: Secondary | ICD-10-CM | POA: Diagnosis not present

## 2019-08-28 DIAGNOSIS — I6529 Occlusion and stenosis of unspecified carotid artery: Secondary | ICD-10-CM | POA: Diagnosis not present

## 2019-08-28 DIAGNOSIS — J9621 Acute and chronic respiratory failure with hypoxia: Secondary | ICD-10-CM | POA: Diagnosis not present

## 2019-08-28 DIAGNOSIS — E872 Acidosis: Secondary | ICD-10-CM | POA: Diagnosis not present

## 2019-08-28 DIAGNOSIS — J441 Chronic obstructive pulmonary disease with (acute) exacerbation: Secondary | ICD-10-CM | POA: Diagnosis not present

## 2019-08-28 DIAGNOSIS — I5032 Chronic diastolic (congestive) heart failure: Secondary | ICD-10-CM | POA: Diagnosis not present

## 2019-08-29 ENCOUNTER — Other Ambulatory Visit: Payer: Self-pay

## 2019-08-29 DIAGNOSIS — J9622 Acute and chronic respiratory failure with hypercapnia: Secondary | ICD-10-CM | POA: Diagnosis not present

## 2019-08-29 DIAGNOSIS — E039 Hypothyroidism, unspecified: Secondary | ICD-10-CM | POA: Diagnosis not present

## 2019-08-29 DIAGNOSIS — E440 Moderate protein-calorie malnutrition: Secondary | ICD-10-CM | POA: Diagnosis not present

## 2019-08-29 DIAGNOSIS — J441 Chronic obstructive pulmonary disease with (acute) exacerbation: Secondary | ICD-10-CM | POA: Diagnosis not present

## 2019-08-29 DIAGNOSIS — E872 Acidosis: Secondary | ICD-10-CM | POA: Diagnosis not present

## 2019-08-29 DIAGNOSIS — J9621 Acute and chronic respiratory failure with hypoxia: Secondary | ICD-10-CM | POA: Diagnosis not present

## 2019-08-29 DIAGNOSIS — I2781 Cor pulmonale (chronic): Secondary | ICD-10-CM | POA: Diagnosis not present

## 2019-08-29 DIAGNOSIS — I6529 Occlusion and stenosis of unspecified carotid artery: Secondary | ICD-10-CM | POA: Diagnosis not present

## 2019-08-29 DIAGNOSIS — I5032 Chronic diastolic (congestive) heart failure: Secondary | ICD-10-CM | POA: Diagnosis not present

## 2019-08-29 NOTE — Patient Outreach (Signed)
[place] [facility]) Care Management  08/29/2019  SOLENA CRISMAN 09/09/1946 [id number]   Telephone call to patient for follow up.  No answer.  HIPAA compliant voice message left.  Plan: RN CM will attempt patient again within 4 business days and send a letter.  [name], RN, MSN [place] Management Care Management Coordinator Direct Line [phone] Cell [phone] Toll Free: [phone]  Fax: [phone]

## 2019-08-30 ENCOUNTER — Other Ambulatory Visit: Payer: Self-pay

## 2019-08-30 ENCOUNTER — Ambulatory Visit (INDEPENDENT_AMBULATORY_CARE_PROVIDER_SITE_OTHER): Payer: Medicare HMO | Admitting: Internal Medicine

## 2019-08-30 ENCOUNTER — Encounter: Payer: Self-pay | Admitting: Internal Medicine

## 2019-08-30 VITALS — BP 116/68 | HR 75 | Temp 98.5°F | Ht 60.0 in | Wt 120.0 lb

## 2019-08-30 DIAGNOSIS — J9611 Chronic respiratory failure with hypoxia: Secondary | ICD-10-CM

## 2019-08-30 DIAGNOSIS — E039 Hypothyroidism, unspecified: Secondary | ICD-10-CM | POA: Diagnosis not present

## 2019-08-30 DIAGNOSIS — J432 Centrilobular emphysema: Secondary | ICD-10-CM | POA: Diagnosis not present

## 2019-08-30 DIAGNOSIS — J4520 Mild intermittent asthma, uncomplicated: Secondary | ICD-10-CM

## 2019-08-30 DIAGNOSIS — I2781 Cor pulmonale (chronic): Secondary | ICD-10-CM | POA: Diagnosis not present

## 2019-08-30 DIAGNOSIS — R739 Hyperglycemia, unspecified: Secondary | ICD-10-CM | POA: Diagnosis not present

## 2019-08-30 DIAGNOSIS — I5032 Chronic diastolic (congestive) heart failure: Secondary | ICD-10-CM | POA: Diagnosis not present

## 2019-08-30 DIAGNOSIS — J9621 Acute and chronic respiratory failure with hypoxia: Secondary | ICD-10-CM | POA: Diagnosis not present

## 2019-08-30 DIAGNOSIS — Z23 Encounter for immunization: Secondary | ICD-10-CM

## 2019-08-30 DIAGNOSIS — I6529 Occlusion and stenosis of unspecified carotid artery: Secondary | ICD-10-CM | POA: Diagnosis not present

## 2019-08-30 DIAGNOSIS — E440 Moderate protein-calorie malnutrition: Secondary | ICD-10-CM | POA: Diagnosis not present

## 2019-08-30 DIAGNOSIS — J441 Chronic obstructive pulmonary disease with (acute) exacerbation: Secondary | ICD-10-CM | POA: Diagnosis not present

## 2019-08-30 DIAGNOSIS — J9622 Acute and chronic respiratory failure with hypercapnia: Secondary | ICD-10-CM | POA: Diagnosis not present

## 2019-08-30 DIAGNOSIS — E872 Acidosis: Secondary | ICD-10-CM | POA: Diagnosis not present

## 2019-08-30 MED ORDER — ZOSTER VAC RECOMB ADJUVANTED 50 MCG/0.5ML IM SUSR: 0.5000 mL | Freq: Once | INTRAMUSCULAR | 1 refills | Status: AC

## 2019-08-30 NOTE — Progress Notes (Signed)
Subjective:    Patient ID: Jasmine [name], female    DOB: May 12, 1946, 73 y.o.   MRN: [id number]  HPI 73 yr old female with hx of COPD on 3L andsupposed to be ontrilogy vent (18/6 full facemask, 35% [id number] home), diastolic heart failure, cor pulmonale (last echo on 12/17/2018 EF 65-70%), depression on Celexa, hypothyroidism, ??Hx of Bilateral lung masses in the past in 2017, polysubstance abuse, admitted on 10/25 with hypercarbic respiratory failure, CO2 in the 90 range, lethargic. Patient admitted for further management.    D/c on baseline o2 at 3L  CompletedLevaquin for COPD exacerbation for 3 doses.  Discharge on tapered dose of prednisone, inhalers, home nebs.  Since home, Pt denies chest pain, increased sob or doe, wheezing, orthopnea, PND, increased LE swelling, palpitations, dizziness or syncope.  Pt denies new neurological symptoms such as new headache, or facial or extremity weakness or numbness   Pt denies polydipsia, polyuria Past Medical History:  Diagnosis Date  . ALLERGIC RHINITIS 05/01/2007  . Anxiety   . Arthritis    "hands" (12/28/2015)  . ASTHMA 05/01/2007   "since I was a child"  . Cancer (HCC)    LUNG  . CHF (congestive heart failure) ([place])   . Chronic bronchitis ([place])   . COPD (chronic obstructive pulmonary disease) ([place])   . DEPRESSION 07/21/2008  . DVT (deep venous thrombosis) (HCC) 1960s   LLE  . FATIGUE 10/10/2010  . HYPERTENSION 05/01/2007  . HYPERTHYROIDISM 11/23/2007   Pt endorses having had Graves disease, possibly radioactive iodine x1, but no thyroidectomy  . HYPOTHYROIDISM 08/23/2009  . LUMBAR RADICULOPATHY, RIGHT 08/25/2008  . On home oxygen therapy    "3-4L; qd; all the time" (12/28/2015)  . OSTEOPOROSIS 05/01/2007  . SHOULDER PAIN, LEFT 08/23/2009  . SINUSITIS, CHRONIC 10/10/2010   Past Surgical History:  Procedure Laterality Date  . APPENDECTOMY    . DILATION AND CURETTAGE OF UTERUS  multiple   history of multiple dialations and curettages and  miscarriages, unfortunately never carrying a child to term  . ECTOPIC PREGNANCY SURGERY  "late '60s or early '70s  . ELECTROCARDIOGRAM  06/20/2006  . OOPHORECTOMY Right     reports that she quit smoking about 3 years ago. Her smoking use included cigarettes. She started smoking about 55 years ago. She has a 52.00 pack-year smoking history. She has never used smokeless tobacco. She reports current alcohol use of about 2.0 standard drinks of alcohol per week. She reports previous drug use. family history includes Alcohol abuse in her brother; Asthma in an other family member; Diabetes in her brother; Hypertension in an other family member; Lung cancer in her father; Stroke in her mother; Thyroid disease in her mother. Allergies  Allergen Reactions  . Alendronate Sodium Other (See Comments)    Patient does not remember this reaction   Current Outpatient Medications on File Prior to Visit  Medication Sig Dispense Refill  . albuterol (PROVENTIL) (2.5 MG/3ML) 0.083% nebulizer solution Take 3 mLs (2.5 mg total) by nebulization every 6 (six) hours as needed for wheezing or shortness of breath. 75 mL 0  . aspirin EC 81 MG EC tablet Take 1 tablet (81 mg total) by mouth daily. 30 tablet 0  . citalopram (CELEXA) 20 MG tablet TAKE 1 TABLET (20 MG TOTAL) BY MOUTH DAILY. 90 tablet 1  . fluticasone furoate-vilanterol (BREO ELLIPTA) 200-25 MCG/INH AEPB Inhale 1 puff into the lungs daily. 1 each 5  . levothyroxine (SYNTHROID) 75 MCG tablet TAKE 1 TABLET BY  MOUTH DAILY BEFORE BREAKFAST. (Patient taking differently: Take 75 mcg by mouth daily. ) 90 tablet 1  . OXYGEN Inhale 3 L into the lungs. 24/7    . potassium chloride (K-DUR) 10 MEQ tablet TAKE 1 TABLET BY MOUTH 2 TIMES DAILY. (Patient taking differently: Take 10 mEq by mouth 2 (two) times daily. ) 180 tablet 3  . predniSONE (DELTASONE) 10 MG tablet Take 4 tabs daily for 3 days, then 3 tabs daily for 3 days, then 2 tabs daily for 3 days, then 1 tab daily for 3  days and complete 30 tablet 0  . SPIRIVA HANDIHALER 18 MCG inhalation capsule PLACE 1 CAPSULE (18 MCG TOTAL) INTO INHALER AND INHALE DAILY. (Patient taking differently: Place 18 mcg into inhaler and inhale daily. ) 90 capsule 1  . traMADol (ULTRAM) 50 MG tablet Take 1 tablet (50 mg total) by mouth 2 (two) times daily as needed. (Patient taking differently: Take 50 mg by mouth 2 (two) times daily as needed for moderate pain. ) 60 tablet 2  . VENTOLIN HFA 108 (90 Base) MCG/ACT inhaler INHALE 1 TO 2 PUFFS INTO THE LUNGS EVERY 6 (SIX) HOURS AS NEEDED FOR WHEEZING OR SHORTNESS OF BREATH. (Patient taking differently: Inhale 1-2 puffs into the lungs every 6 (six) hours as needed for wheezing or shortness of breath. ) 18 g 5   No current facility-administered medications on file prior to visit.    Review of Systems  Constitutional: Negative for other unusual diaphoresis or sweats HENT: Negative for ear discharge or swelling Eyes: Negative for other worsening visual disturbances Respiratory: Negative for stridor or other swelling  Gastrointestinal: Negative for worsening distension or other blood Genitourinary: Negative for retention or other urinary change Musculoskeletal: Negative for other MSK pain or swelling Skin: Negative for color change or other new lesions Neurological: Negative for worsening tremors and other numbness  Psychiatric/Behavioral: Negative for worsening agitation or other fatigue All otherwise neg per pt    Objective:   Physical Exam BP 116/68   Pulse 75   Temp 98.5 F (36.9 C) (Oral)   Ht 5' (1.524 m)   Wt 120 lb (54.4 kg)   SpO2 96%   BMI 23.44 kg/m  VS noted,  Constitutional: Pt appears in NAD HENT: Head: NCAT.  Right Ear: External ear normal.  Left Ear: External ear normal.  Eyes: . Pupils are equal, round, and reactive to light. Conjunctivae and EOM are normal Nose: without d/c or deformity Neck: Neck supple. Gross normal ROM Cardiovascular: Normal rate and  regular rhythm.   Pulmonary/Chest: Effort normal and breath sounds decreased without rales or wheezing.  Abd:  Soft, NT, ND, + BS, no organomegaly Neurological: Pt is alert. At baseline orientation, motor grossly intact Skin: Skin is warm. No rashes, other new lesions, no LE edema Psychiatric: Pt behavior is normal without agitation  All otherwise neg per pt  Lab Results  Component Value Date   WBC 7.4 08/18/2019   HGB 12.8 08/18/2019   HCT 40.7 08/18/2019   PLT 163 08/18/2019   GLUCOSE 100 (H) 08/18/2019   CHOL 250 (H) 09/20/2018   TRIG 62.0 09/20/2018   HDL 98.60 09/20/2018   LDLDIRECT 102.1 10/10/2010   LDLCALC 139 (H) 09/20/2018   ALT 13 08/16/2019   AST 29 08/16/2019   NA 136 08/18/2019   K 3.7 08/18/2019   CL 88 (L) 08/18/2019   CREATININE 0.66 08/18/2019   BUN 17 08/18/2019   CO2 39 (H) 08/18/2019   TSH  0.272 (L) 03/06/2019   INR 1.58 (H) 03/25/2016   HGBA1C 6.1 09/20/2018         Assessment & Plan:

## 2019-08-30 NOTE — Patient Instructions (Addendum)
You had the flu shot today (senior dose)  Your shingles shot prescription was sent to your pharmacy  Please continue all other medications as before, and refills have been done if requested.  Please have the pharmacy call with any other refills you may need.  Please continue your efforts at being more active, low cholesterol diet, and weight control.  Please keep your appointments with your specialists as you may have planned  Please return in 3 months, or sooner if needed

## 2019-08-31 ENCOUNTER — Other Ambulatory Visit: Payer: Self-pay

## 2019-08-31 DIAGNOSIS — J9621 Acute and chronic respiratory failure with hypoxia: Secondary | ICD-10-CM | POA: Diagnosis not present

## 2019-08-31 DIAGNOSIS — E039 Hypothyroidism, unspecified: Secondary | ICD-10-CM

## 2019-08-31 DIAGNOSIS — I6529 Occlusion and stenosis of unspecified carotid artery: Secondary | ICD-10-CM

## 2019-08-31 DIAGNOSIS — Z7951 Long term (current) use of inhaled steroids: Secondary | ICD-10-CM

## 2019-08-31 DIAGNOSIS — Z9181 History of falling: Secondary | ICD-10-CM

## 2019-08-31 DIAGNOSIS — Z9981 Dependence on supplemental oxygen: Secondary | ICD-10-CM

## 2019-08-31 DIAGNOSIS — M545 Low back pain: Secondary | ICD-10-CM

## 2019-08-31 DIAGNOSIS — E440 Moderate protein-calorie malnutrition: Secondary | ICD-10-CM

## 2019-08-31 DIAGNOSIS — Z7982 Long term (current) use of aspirin: Secondary | ICD-10-CM

## 2019-08-31 DIAGNOSIS — I281 Aneurysm of pulmonary artery: Secondary | ICD-10-CM

## 2019-08-31 DIAGNOSIS — J441 Chronic obstructive pulmonary disease with (acute) exacerbation: Secondary | ICD-10-CM | POA: Diagnosis not present

## 2019-08-31 DIAGNOSIS — I5032 Chronic diastolic (congestive) heart failure: Secondary | ICD-10-CM | POA: Diagnosis not present

## 2019-08-31 DIAGNOSIS — E872 Acidosis: Secondary | ICD-10-CM

## 2019-08-31 DIAGNOSIS — F329 Major depressive disorder, single episode, unspecified: Secondary | ICD-10-CM

## 2019-08-31 DIAGNOSIS — F17200 Nicotine dependence, unspecified, uncomplicated: Secondary | ICD-10-CM

## 2019-08-31 DIAGNOSIS — Z7952 Long term (current) use of systemic steroids: Secondary | ICD-10-CM

## 2019-08-31 DIAGNOSIS — J9622 Acute and chronic respiratory failure with hypercapnia: Secondary | ICD-10-CM | POA: Diagnosis not present

## 2019-08-31 NOTE — Patient Outreach (Signed)
[facility]) Care Management  08/31/2019  Jasmine [name] 1946/10/02 [id number]   Telephone call to patient for follow up.  No answer.  HIPAA compliant voice message left.  Plan: RN CM will attempt patient again within 4 business days.  [name], RN, MSN [place] Management Care Management Coordinator Direct Line [phone] Cell [phone] Toll Free: [phone]  Fax: [phone]

## 2019-09-01 ENCOUNTER — Ambulatory Visit: Payer: Medicare HMO

## 2019-09-01 ENCOUNTER — Other Ambulatory Visit: Payer: Self-pay

## 2019-09-01 ENCOUNTER — Telehealth: Payer: Self-pay

## 2019-09-01 ENCOUNTER — Other Ambulatory Visit: Payer: Self-pay | Admitting: Internal Medicine

## 2019-09-01 DIAGNOSIS — E039 Hypothyroidism, unspecified: Secondary | ICD-10-CM | POA: Diagnosis not present

## 2019-09-01 DIAGNOSIS — I6529 Occlusion and stenosis of unspecified carotid artery: Secondary | ICD-10-CM | POA: Diagnosis not present

## 2019-09-01 DIAGNOSIS — E872 Acidosis: Secondary | ICD-10-CM | POA: Diagnosis not present

## 2019-09-01 DIAGNOSIS — I5032 Chronic diastolic (congestive) heart failure: Secondary | ICD-10-CM | POA: Diagnosis not present

## 2019-09-01 DIAGNOSIS — J441 Chronic obstructive pulmonary disease with (acute) exacerbation: Secondary | ICD-10-CM | POA: Diagnosis not present

## 2019-09-01 DIAGNOSIS — I2781 Cor pulmonale (chronic): Secondary | ICD-10-CM | POA: Diagnosis not present

## 2019-09-01 DIAGNOSIS — J9621 Acute and chronic respiratory failure with hypoxia: Secondary | ICD-10-CM | POA: Diagnosis not present

## 2019-09-01 DIAGNOSIS — E440 Moderate protein-calorie malnutrition: Secondary | ICD-10-CM | POA: Diagnosis not present

## 2019-09-01 DIAGNOSIS — J9622 Acute and chronic respiratory failure with hypercapnia: Secondary | ICD-10-CM | POA: Diagnosis not present

## 2019-09-01 NOTE — Telephone Encounter (Signed)
Those parameters are ok, thanks

## 2019-09-01 NOTE — Telephone Encounter (Signed)
Amy has been informed.

## 2019-09-01 NOTE — Telephone Encounter (Addendum)
No parameters were mentioned in the original message. She would like to know a range that you would feel comfortable with the patient being in. Please advise.

## 2019-09-01 NOTE — Patient Outreach (Signed)
[facility]) Care Management  09/01/2019  Jasmine [name] [birth year] [id number]   Telephone call to patient for follow up.  Patient reports she is ding good.  She reports that she saw PCP yesterday and visit went well and not scheduled again until February.  Patient reports Alvis Lemmings continues involvement. Discussed with patient social work referral for resources and that [facility] social worker had been trying to reach her.  Patient states she has had problems with her phone.  Social work contact information given to patient to call today.  Patient states that if she cannot be reach to call her Ex-spouse [name] who is in the home with her.    Discussed COPD and regimen and importance of BiPAP use to prevent COPD exacerbation.  She verbalized understanding and voices no concerns.    Plan: RN CM will contact patient in the month of December and patient agreeable.    [name], RN, MSN [place] Management Care Management Coordinator Direct Line [phone] Cell [phone] Toll Free: [phone]  Fax: [phone]

## 2019-09-01 NOTE — Telephone Encounter (Signed)
o2 sat 85 not getting back to 90% within 1 minute

## 2019-09-01 NOTE — Telephone Encounter (Signed)
Copied from [place] [phone] [id number]. Topic: General - Inquiry >> Sep 01, 2019  2:03 PM Virl Axe D wrote: Reason for CRM: Amy, Pt with Alvis Lemmings stated pt's oxygen drops to the mid 80s with leisurely walking but it will come back up around 30 seconds to 1 minute after. She would like to know what parameters [name] is comfortable with and when he wants to be notified. Please advise.

## 2019-09-02 ENCOUNTER — Ambulatory Visit: Payer: Self-pay

## 2019-09-02 ENCOUNTER — Other Ambulatory Visit: Payer: Self-pay

## 2019-09-02 ENCOUNTER — Other Ambulatory Visit: Payer: Self-pay | Admitting: Pulmonary Disease

## 2019-09-02 NOTE — Patient Outreach (Signed)
[facility]) Care Management  09/02/2019  Jasmine [name] Oct 14, 1946 [id number]   Social work closure due to inability to contact.    Ronn Melena, BSW Social Worker [phone]

## 2019-09-03 DIAGNOSIS — J9612 Chronic respiratory failure with hypercapnia: Secondary | ICD-10-CM | POA: Diagnosis not present

## 2019-09-03 DIAGNOSIS — B9561 Methicillin susceptible Staphylococcus aureus infection as the cause of diseases classified elsewhere: Secondary | ICD-10-CM | POA: Diagnosis not present

## 2019-09-03 DIAGNOSIS — J449 Chronic obstructive pulmonary disease, unspecified: Secondary | ICD-10-CM | POA: Diagnosis not present

## 2019-09-03 DIAGNOSIS — R7881 Bacteremia: Secondary | ICD-10-CM | POA: Diagnosis not present

## 2019-09-04 ENCOUNTER — Encounter: Payer: Self-pay | Admitting: Internal Medicine

## 2019-09-04 NOTE — Assessment & Plan Note (Signed)
stable overall by history and exam, recent data reviewed with pt, and pt to continue medical treatment as before,  to f/u any worsening symptoms or concerns  

## 2019-09-04 NOTE — Assessment & Plan Note (Addendum)
stable overall by history and exam, recent data reviewed with pt, and pt to continue medical treatment as before,  to f/u any worsening symptoms or concerns  Note:  Total time for pt hx, exam, review of record with pt in the room, determination of diagnoses and plan for further eval and tx is > 40 min, with over 50% spent in coordination and counseling of patient including the differential dx, tx, further evaluation and other management of COPD, acute resp failure now resolved, hyperglycemia, asthma

## 2019-09-05 ENCOUNTER — Telehealth: Payer: Self-pay | Admitting: Internal Medicine

## 2019-09-05 NOTE — Telephone Encounter (Signed)
Patient dropped off an Application for Renewal of Disability Parking Placard and Application for Disability License Plate to be completed by Dr Jenny Reichmann. Patient would like to be called when they are ready so that she can come pick them up. She can be reached at [phone] or [phone].  Placed in Brittany's box for completion.

## 2019-09-05 NOTE — Telephone Encounter (Signed)
Forms have been placed in providers box to review and sign.

## 2019-09-06 DIAGNOSIS — I6529 Occlusion and stenosis of unspecified carotid artery: Secondary | ICD-10-CM | POA: Diagnosis not present

## 2019-09-06 DIAGNOSIS — J9622 Acute and chronic respiratory failure with hypercapnia: Secondary | ICD-10-CM | POA: Diagnosis not present

## 2019-09-06 DIAGNOSIS — E440 Moderate protein-calorie malnutrition: Secondary | ICD-10-CM | POA: Diagnosis not present

## 2019-09-06 DIAGNOSIS — I2781 Cor pulmonale (chronic): Secondary | ICD-10-CM | POA: Diagnosis not present

## 2019-09-06 DIAGNOSIS — J9621 Acute and chronic respiratory failure with hypoxia: Secondary | ICD-10-CM | POA: Diagnosis not present

## 2019-09-06 DIAGNOSIS — I5032 Chronic diastolic (congestive) heart failure: Secondary | ICD-10-CM | POA: Diagnosis not present

## 2019-09-06 DIAGNOSIS — E039 Hypothyroidism, unspecified: Secondary | ICD-10-CM | POA: Diagnosis not present

## 2019-09-06 DIAGNOSIS — E872 Acidosis: Secondary | ICD-10-CM | POA: Diagnosis not present

## 2019-09-06 DIAGNOSIS — R911 Solitary pulmonary nodule: Secondary | ICD-10-CM | POA: Diagnosis not present

## 2019-09-06 DIAGNOSIS — R300 Dysuria: Secondary | ICD-10-CM | POA: Diagnosis not present

## 2019-09-06 DIAGNOSIS — J441 Chronic obstructive pulmonary disease with (acute) exacerbation: Secondary | ICD-10-CM | POA: Diagnosis not present

## 2019-09-06 DIAGNOSIS — J449 Chronic obstructive pulmonary disease, unspecified: Secondary | ICD-10-CM | POA: Diagnosis not present

## 2019-09-06 DIAGNOSIS — J9611 Chronic respiratory failure with hypoxia: Secondary | ICD-10-CM | POA: Diagnosis not present

## 2019-09-07 ENCOUNTER — Other Ambulatory Visit: Payer: Self-pay

## 2019-09-07 NOTE — Progress Notes (Signed)
73 y.o. [id number] Significant Other Black or African American Not Hispanic or Latino female here for annual exam.  Not sexually active, she is with her ex-husband. No vaginal bleeding. Occasional brown d/c. No itching, burning, irritation or odor. No bowel or bladder c/o.     No LMP recorded. Patient is postmenopausal.          Sexually active: No.  The current method of family planning is post menopausal status.    Exercising: No.  The patient does not participate in regular exercise at present. Smoker:  no  Health Maintenance: Pap:01-16-16 WNL History of abnormal Pap:no MMG:11/01/2018 Birads 1 negative Colonoscopy:07/20/2003 WNL, Cologuard 12/17/2018 Negative BMD:07/20/2003 TDaP:2010 Gardasil:N/A    reports that she quit smoking about 3 years ago. Her smoking use included cigarettes. She started smoking about 55 years ago. She has a 52.00 pack-year smoking history. She has never used smokeless tobacco. She reports previous alcohol use. She reports previous drug use.  Past Medical History:  Diagnosis Date  . ALLERGIC RHINITIS 05/01/2007  . Anxiety   . Arthritis    "hands" (12/28/2015)  . ASTHMA 05/01/2007   "since I was a child"  . Cancer (HCC)    LUNG  . CHF (congestive heart failure) ([place])   . Chronic bronchitis ([place])   . COPD (chronic obstructive pulmonary disease) ([place])   . DEPRESSION 07/21/2008  . DVT (deep venous thrombosis) (HCC) 1960s   LLE  . FATIGUE 10/10/2010  . HYPERTENSION 05/01/2007  . HYPERTHYROIDISM 11/23/2007   Pt endorses having had Graves disease, possibly radioactive iodine x1, but no thyroidectomy  . HYPOTHYROIDISM 08/23/2009  . LUMBAR RADICULOPATHY, RIGHT 08/25/2008  . On home oxygen therapy    "3-4L; qd; all the time" (12/28/2015)  . OSTEOPOROSIS 05/01/2007  . SHOULDER PAIN, LEFT 08/23/2009  . SINUSITIS, CHRONIC 10/10/2010    Past Surgical History:  Procedure Laterality Date  . APPENDECTOMY    . DILATION AND CURETTAGE OF UTERUS  multiple    history of multiple dialations and curettages and miscarriages, unfortunately never carrying a child to term  . ECTOPIC PREGNANCY SURGERY  "late '60s or early '70s  . ELECTROCARDIOGRAM  06/20/2006  . OOPHORECTOMY Right     Current Outpatient Medications  Medication Sig Dispense Refill  . albuterol (PROVENTIL) (2.5 MG/3ML) 0.083% nebulizer solution Take 3 mLs (2.5 mg total) by nebulization every 6 (six) hours as needed for wheezing or shortness of breath. 75 mL 0  . aspirin EC 81 MG EC tablet Take 1 tablet (81 mg total) by mouth daily. 30 tablet 0  . atorvastatin (LIPITOR) 10 MG tablet TAKE 1 TABLET (10 MG TOTAL) BY MOUTH DAILY. 90 tablet 3  . citalopram (CELEXA) 20 MG tablet TAKE 1 TABLET (20 MG TOTAL) BY MOUTH DAILY. 90 tablet 1  . fluticasone furoate-vilanterol (BREO ELLIPTA) 200-25 MCG/INH AEPB Inhale 1 puff into the lungs daily. 1 each 5  . furosemide (LASIX) 40 MG tablet TAKE 1 TABLET BY MOUTH IN THE MORNING AND 1 TABLET IN THE EVENING AS NEEDED FOR SWELLING 180 tablet 3  . levothyroxine (SYNTHROID) 75 MCG tablet TAKE 1 TABLET BY MOUTH DAILY BEFORE BREAKFAST. (Patient taking differently: Take 75 mcg by mouth daily. ) 90 tablet 1  . OXYGEN Inhale 3 L into the lungs. 24/7    . potassium chloride (K-DUR) 10 MEQ tablet TAKE 1 TABLET BY MOUTH 2 TIMES DAILY. (Patient taking differently: Take 10 mEq by mouth 2 (two) times daily. ) 180 tablet 3  . SPIRIVA HANDIHALER 18  MCG inhalation capsule PLACE 1 CAPSULE INTO INHALER AND INHALE DAILY. 90 capsule 1  . traMADol (ULTRAM) 50 MG tablet Take 1 tablet (50 mg total) by mouth 2 (two) times daily as needed. (Patient taking differently: Take 50 mg by mouth 2 (two) times daily as needed for moderate pain. ) 60 tablet 2  . VENTOLIN HFA 108 (90 Base) MCG/ACT inhaler INHALE 1 TO 2 PUFFS INTO THE LUNGS EVERY 6 (SIX) HOURS AS NEEDED FOR WHEEZING OR SHORTNESS OF BREATH. (Patient taking differently: Inhale 1-2 puffs into the lungs every 6 (six) hours as needed for  wheezing or shortness of breath. ) 18 g 5   No current facility-administered medications for this visit.     Family History  Problem Relation Age of Onset  . Lung cancer Father   . Alcohol abuse Brother   . Diabetes Brother   . Hypertension Other   . Stroke Mother   . Thyroid disease Mother   . Asthma Other        maternal aunts  . Breast cancer Neg Hx     Review of Systems  Constitutional: Negative.   HENT: Negative.   Eyes: Negative.   Respiratory: Negative.   Cardiovascular: Negative.   Gastrointestinal: Negative.   Endocrine: Negative.   Genitourinary: Negative.   Musculoskeletal: Negative.   Skin: Negative.   Allergic/Immunologic: Negative.   Neurological: Negative.   Hematological: Negative.   Psychiatric/Behavioral: Negative.     Exam:   BP (!) 130/58 (BP Location: Right Arm, Patient Position: Sitting, Cuff Size: Normal)   Pulse (!) 104   Temp (!) 97.2 F (36.2 C) (Skin)   Ht 5' (1.524 m)   Wt 118 lb 12.8 oz (53.9 kg)   BMI 23.20 kg/m   Weight change: @WEIGHTCHANGE @ Height:   Height: 5' (152.4 cm)  Ht Readings from Last 3 Encounters:  09/08/19 5' (1.524 m)  08/30/19 5' (1.524 m)  08/15/19 5' (1.524 m)    General appearance: alert, cooperative and appears stated age Head: Normocephalic, without obvious abnormality, atraumatic Neck: no adenopathy, supple, symmetrical, trachea midline and thyroid normal to inspection and palpation Lungs: clear to auscultation bilaterally Cardiovascular: regular rate and rhythm Breasts: normal appearance, no masses or tenderness Abdomen: soft, non-tender; non distended,  no masses,  no organomegaly Extremities: extremities normal, atraumatic, no cyanosis or edema Skin: Skin color, texture, turgor normal. No rashes or lesions Lymph nodes: Cervical, supraclavicular, and axillary nodes normal. No abnormal inguinal nodes palpated Neurologic: Grossly normal   Pelvic: External genitalia:  no lesions              Urethra:   normal appearing urethra with no masses, tenderness or lesions              Bartholins and Skenes: normal                 Vagina: atrophic appearing vagina with normal color and discharge, no lesions              Cervix: no lesions               Bimanual Exam:  Uterus:  normal size, contour, position, consistency, mobility, non-tender              Adnexa: no mass, fullness, tenderness               Rectovaginal: Confirms               Anus:  normal sphincter tone, no  lesions  Chaperone was present for exam.  A:  Well Woman with normal exam  H/O osteoporosis, allergy to Fosamax  P:   No pap this year  Cologuard UTD  Mammogram in 1/21  Discussed breast self exam  Discussed calcium and vit D intake  DEXA with her primary  Information on Osteoporosis

## 2019-09-08 ENCOUNTER — Encounter: Payer: Self-pay | Admitting: Obstetrics and Gynecology

## 2019-09-08 ENCOUNTER — Ambulatory Visit (INDEPENDENT_AMBULATORY_CARE_PROVIDER_SITE_OTHER): Payer: Medicare HMO | Admitting: Obstetrics and Gynecology

## 2019-09-08 VITALS — BP 130/58 | HR 104 | Temp 97.2°F | Ht 60.0 in | Wt 118.8 lb

## 2019-09-08 DIAGNOSIS — Z8739 Personal history of other diseases of the musculoskeletal system and connective tissue: Secondary | ICD-10-CM | POA: Diagnosis not present

## 2019-09-08 DIAGNOSIS — J9611 Chronic respiratory failure with hypoxia: Secondary | ICD-10-CM | POA: Diagnosis not present

## 2019-09-08 DIAGNOSIS — Z01419 Encounter for gynecological examination (general) (routine) without abnormal findings: Secondary | ICD-10-CM | POA: Diagnosis not present

## 2019-09-08 NOTE — Patient Instructions (Addendum)
You are due for a mammogram in 1/21  EXERCISE AND DIET:  We recommended that you start or continue a regular exercise program for good health. Regular exercise means any activity that makes your heart beat faster and makes you sweat.  We recommend exercising at least 30 minutes per day at least 3 days a week, preferably 4 or 5.  We also recommend a diet low in fat and sugar.  Inactivity, poor dietary choices and obesity can cause diabetes, heart attack, stroke, and kidney damage, among others.    ALCOHOL AND SMOKING:  Women should limit their alcohol intake to no more than 7 drinks/beers/glasses of wine (combined, not each!) per week. Moderation of alcohol intake to this level decreases your risk of breast cancer and liver damage. And of course, no recreational drugs are part of a healthy lifestyle.  And absolutely no smoking or even second hand smoke. Most people know smoking can cause heart and lung diseases, but did you know it also contributes to weakening of your bones? Aging of your skin?  Yellowing of your teeth and nails?  CALCIUM AND VITAMIN D:  Adequate intake of calcium and Vitamin D are recommended.  The recommendations for exact amounts of these supplements seem to change often, but generally speaking 1,200 mg of calcium (between diet and supplement) and 800 units of Vitamin D per day seems prudent. Certain women may benefit from higher intake of Vitamin D.  If you are among these women, your doctor will have told you during your visit.    PAP SMEARS:  Pap smears, to check for cervical cancer or precancers,  have traditionally been done yearly, although recent scientific advances have shown that most women can have pap smears less often.  However, every woman still should have a physical exam from her gynecologist every year. It will include a breast check, inspection of the vulva and vagina to check for abnormal growths or skin changes, a visual exam of the cervix, and then an exam to evaluate  the size and shape of the uterus and ovaries.  And after 74 years of age, a rectal exam is indicated to check for rectal cancers. We will also provide age appropriate advice regarding health maintenance, like when you should have certain vaccines, screening for sexually transmitted diseases, bone density testing, colonoscopy, mammograms, etc.   MAMMOGRAMS:  All women over 56 years old should have a yearly mammogram. Many facilities now offer a "3D" mammogram, which may cost around $50 extra out of pocket. If possible,  we recommend you accept the option to have the 3D mammogram performed.  It both reduces the number of women who will be called back for extra views which then turn out to be normal, and it is better than the routine mammogram at detecting truly abnormal areas.    COLON CANCER SCREENING: Now recommend starting at age 55. At this time colonoscopy is not covered for routine screening until 50. There are take home tests that can be done between 45-49.   COLONOSCOPY:  Colonoscopy to screen for colon cancer is recommended for all women at age 38.  We know, you hate the idea of the prep.  We agree, BUT, having colon cancer and not knowing it is worse!!  Colon cancer so often starts as a polyp that can be seen and removed at colonscopy, which can quite literally save your life!  And if your first colonoscopy is normal and you have no family history of colon cancer,  most women don't have to have it again for 10 years.  Once every ten years, you can do something that may end up saving your life, right?  We will be happy to help you get it scheduled when you are ready.  Be sure to check your insurance coverage so you understand how much it will cost.  It may be covered as a preventative service at no cost, but you should check your particular policy.      Breast Self-Awareness Breast self-awareness means being familiar with how your breasts look and feel. It involves checking your breasts regularly  and reporting any changes to your health care provider. Practicing breast self-awareness is important. A change in your breasts can be a sign of a serious medical problem. Being familiar with how your breasts look and feel allows you to find any problems early, when treatment is more likely to be successful. All women should practice breast self-awareness, including women who have had breast implants. How to do a breast self-exam One way to learn what is normal for your breasts and whether your breasts are changing is to do a breast self-exam. To do a breast self-exam: Look for Changes  1. Remove all the clothing above your waist. 2. Stand in front of a mirror in a room with good lighting. 3. Put your hands on your hips. 4. Push your hands firmly downward. 5. Compare your breasts in the mirror. Look for differences between them (asymmetry), such as: ? Differences in shape. ? Differences in size. ? Puckers, dips, and bumps in one breast and not the other. 6. Look at each breast for changes in your skin, such as: ? Redness. ? Scaly areas. 7. Look for changes in your nipples, such as: ? Discharge. ? Bleeding. ? Dimpling. ? Redness. ? A change in position. Feel for Changes Carefully feel your breasts for lumps and changes. It is best to do this while lying on your back on the floor and again while sitting or standing in the shower or tub with soapy water on your skin. Feel each breast in the following way:  Place the arm on the side of the breast you are examining above your head.  Feel your breast with the other hand.  Start in the nipple area and make  inch (2 cm) overlapping circles to feel your breast. Use the pads of your three middle fingers to do this. Apply light pressure, then medium pressure, then firm pressure. The light pressure will allow you to feel the tissue closest to the skin. The medium pressure will allow you to feel the tissue that is a little deeper. The firm pressure  will allow you to feel the tissue close to the ribs.  Continue the overlapping circles, moving downward over the breast until you feel your ribs below your breast.  Move one finger-width toward the center of the body. Continue to use the  inch (2 cm) overlapping circles to feel your breast as you move slowly up toward your collarbone.  Continue the up and down exam using all three pressures until you reach your armpit.  Write Down What You Find  Write down what is normal for each breast and any changes that you find. Keep a written record with breast changes or normal findings for each breast. By writing this information down, you do not need to depend only on memory for size, tenderness, or location. Write down where you are in your menstrual cycle, if you are  still menstruating. If you are having trouble noticing differences in your breasts, do not get discouraged. With time you will become more familiar with the variations in your breasts and more comfortable with the exam. How often should I examine my breasts? Examine your breasts every month. If you are breastfeeding, the best time to examine your breasts is after a feeding or after using a breast pump. If you menstruate, the best time to examine your breasts is 5-7 days after your period is over. During your period, your breasts are lumpier, and it may be more difficult to notice changes. When should I see my health care provider? See your health care provider if you notice:  A change in shape or size of your breasts or nipples.  A change in the skin of your breast or nipples, such as a reddened or scaly area.  Unusual discharge from your nipples.  A lump or thick area that was not there before.  Pain in your breasts.  Anything that concerns you.   Osteoporosis  Osteoporosis is thinning and loss of density in your bones. Osteoporosis makes bones more brittle and fragile and more likely to break (fracture). Over time,  osteoporosis can cause your bones to become so weak that they fracture after a minor fall. Bones in the hip, wrist, and spine are most likely to fracture due to osteoporosis. What are the causes? The exact cause of this condition is not known. What increases the risk? You may be at greater risk for osteoporosis if you:  Have a family history of the condition.  Have poor nutrition.  Use steroid medicines, such as prednisone.  Are female.  Are age 72 or older.  Smoke or have a history of smoking.  Are not physically active (are sedentary).  Are white (Caucasian) or of Asian descent.  Have a small body frame.  Take certain medicines, such as antiseizure medicines. What are the signs or symptoms? A fracture might be the first sign of osteoporosis, especially if the fracture results from a fall or injury that usually would not cause a bone to break. Other signs and symptoms include:  Pain in the neck or low back.  Stooped posture.  Loss of height. How is this diagnosed? This condition may be diagnosed based on:  Your medical history.  A physical exam.  A bone mineral density test, also called a DXA or DEXA test (dual-energy X-ray absorptiometry test). This test uses X-rays to measure the amount of minerals in your bones. How is this treated? The goal of treatment is to strengthen your bones and lower your risk for a fracture. Treatment may involve:  Making lifestyle changes, such as: ? Including foods with more calcium and vitamin D in your diet. ? Doing weight-bearing and muscle-strengthening exercises. ? Stopping tobacco use. ? Limiting alcohol intake.  Taking medicine to slow the process of bone loss or to increase bone density.  Taking daily supplements of calcium and vitamin D.  Taking hormone replacement medicines, such as estrogen for women and testosterone for men.  Monitoring your levels of calcium and vitamin D. Follow these instructions at  home:  Activity  Exercise as told by your health care provider. Ask your health care provider what exercises and activities are safe for you. You should do: ? Exercises that make you work against gravity (weight-bearing exercises), such as tai chi, yoga, or walking. ? Exercises to strengthen muscles, such as lifting weights. Lifestyle  Limit alcohol intake to no more  than 1 drink a day for nonpregnant women and 2 drinks a day for men. One drink equals 12 oz of beer, 5 oz of wine, or 1 oz of hard liquor.  Do not use any products that contain nicotine or tobacco, such as cigarettes and e-cigarettes. If you need help quitting, ask your health care provider. Preventing falls  Use devices to help you move around (mobility aids) as needed, such as canes, walkers, scooters, or crutches.  Keep rooms well-lit and clutter-free.  Remove tripping hazards from walkways, including cords and throw rugs.  Install grab bars in bathrooms and safety rails on stairs.  Use rubber mats in the bathroom and other areas that are often wet or slippery.  Wear closed-toe shoes that fit well and support your feet. Wear shoes that have rubber soles or low heels.  Review your medicines with your health care provider. Some medicines can cause dizziness or changes in blood pressure, which can increase your risk of falling. General instructions  Include calcium and vitamin D in your diet. Calcium is important for bone health, and vitamin D helps your body to absorb calcium. Good sources of calcium and vitamin D include: ? Certain fatty fish, such as salmon and tuna. ? Products that have calcium and vitamin D added to them (fortified products), such as fortified cereals. ? Egg yolks. ? Cheese. ? Liver.  Take over-the-counter and prescription medicines only as told by your health care provider.  Keep all follow-up visits as told by your health care provider. This is important. Contact a health care provider  if:  You have never been screened for osteoporosis and you are: ? A woman who is age 52 or older. ? A man who is age 82 or older. Get help right away if:  You fall or injure yourself. Summary  Osteoporosis is thinning and loss of density in your bones. This makes bones more brittle and fragile and more likely to break (fracture),even with minor falls.  The goal of treatment is to strengthen your bones and reduce your risk for a fracture.  Include calcium and vitamin D in your diet. Calcium is important for bone health, and vitamin D helps your body to absorb calcium.  Talk with your health care provider about screening for osteoporosis if you are a woman who is age 32 or older, or a man who is age 92 or older. This information is not intended to replace advice given to you by your health care provider. Make sure you discuss any questions you have with your health care provider. Document Released: 07/16/2005 Document Revised: 09/18/2017 Document Reviewed: 07/31/2017 Elsevier Patient Education  2020 [employer].

## 2019-09-08 NOTE — Telephone Encounter (Signed)
Patient returning call. Advised that forms were ready for pick up. States that she would be in today around lunch to pick these up.

## 2019-09-08 NOTE — Telephone Encounter (Signed)
LVM to inform patient that forms are complete and ready to be picked up.   Copy sent to scan.

## 2019-09-09 ENCOUNTER — Other Ambulatory Visit: Payer: Self-pay | Admitting: Internal Medicine

## 2019-09-09 DIAGNOSIS — E872 Acidosis: Secondary | ICD-10-CM | POA: Diagnosis not present

## 2019-09-09 DIAGNOSIS — E440 Moderate protein-calorie malnutrition: Secondary | ICD-10-CM | POA: Diagnosis not present

## 2019-09-09 DIAGNOSIS — I6529 Occlusion and stenosis of unspecified carotid artery: Secondary | ICD-10-CM | POA: Diagnosis not present

## 2019-09-09 DIAGNOSIS — J9621 Acute and chronic respiratory failure with hypoxia: Secondary | ICD-10-CM | POA: Diagnosis not present

## 2019-09-09 DIAGNOSIS — I2781 Cor pulmonale (chronic): Secondary | ICD-10-CM | POA: Diagnosis not present

## 2019-09-09 DIAGNOSIS — I5032 Chronic diastolic (congestive) heart failure: Secondary | ICD-10-CM | POA: Diagnosis not present

## 2019-09-09 DIAGNOSIS — E039 Hypothyroidism, unspecified: Secondary | ICD-10-CM | POA: Diagnosis not present

## 2019-09-09 DIAGNOSIS — J441 Chronic obstructive pulmonary disease with (acute) exacerbation: Secondary | ICD-10-CM | POA: Diagnosis not present

## 2019-09-09 DIAGNOSIS — J9622 Acute and chronic respiratory failure with hypercapnia: Secondary | ICD-10-CM | POA: Diagnosis not present

## 2019-09-09 MED ORDER — LEVOTHYROXINE SODIUM 75 MCG PO TABS: ORAL_TABLET | ORAL | 1 refills | Status: DC

## 2019-09-09 NOTE — Telephone Encounter (Signed)
Medication Refill - Medication: levothyroxine.Pt states she may be supposed to be getting these medication from [facility]. Please advise.  Has the patient contacted their pharmacy? Yes.  Pt states she has been out of this medication for about 10 days. Please advise (Agent: If no, request that the patient contact the pharmacy for the refill.) (Agent: If yes, when and what did the pharmacy advise?)  Preferred Pharmacy (with phone number or street name):  [facility], [place]  [place] [address]  Phone: [phone] Fax: [phone]  Not a 24 hour pharmacy; exact hours not known.     Agent: Please be advised that RX refills may take up to 3 business days. We ask that you follow-up with your pharmacy.

## 2019-09-09 NOTE — Telephone Encounter (Signed)
Requested medication (s) are due for refill today: yes  Requested medication (s) are on the active medication list: yes  Last refill:  05/11/2019  Future visit scheduled: yes  Notes to clinic:  Pt states she may be supposed to be getting these medication from [facility]. Please advise. Patient has been out of medication for 10 days   Requested Prescriptions  Pending Prescriptions Disp Refills   levothyroxine (SYNTHROID) 75 MCG tablet 90 tablet 1    Sig: TAKE 1 TABLET BY MOUTH DAILY BEFORE BREAKFAST.     Endocrinology:  Hypothyroid Agents Failed - 09/09/2019 12:34 PM      Failed - TSH needs to be rechecked within 3 months after an abnormal result. Refill until TSH is due.      Failed - TSH in normal range and within 360 days    TSH  Date Value Ref Range Status  03/06/2019 0.272 (L) 0.350 - 4.500 uIU/mL Final    Comment:    Performed by a 3rd Generation assay with a functional sensitivity of <=0.01 uIU/mL. Performed at [facility], [place] [address] [place], [place] [address]   09/20/2018 1.11 0.35 - 4.50 uIU/mL Final         Passed - Valid encounter within last 12 months    Recent Outpatient Visits          1 week ago Centrilobular emphysema ([place])   [facility] -Georges Mouse, MD   8 months ago Centrilobular emphysema [facility])   [facility] -Georges Mouse, MD   11 months ago Preventative health care   [facility] -Georges Mouse, MD   1 year ago Chronic respiratory failure with hypoxia [facility])   [facility] -Georges Mouse, MD   1 year ago Bilateral hearing loss, unspecified hearing loss type   [place], [place]      Future Appointments            In 2 months  [place], [place]   In 2 months Jenny Reichmann, Hunt Oris, MD [place], [facility]

## 2019-09-13 ENCOUNTER — Other Ambulatory Visit: Payer: Self-pay | Admitting: Internal Medicine

## 2019-09-13 ENCOUNTER — Other Ambulatory Visit: Payer: Self-pay | Admitting: Pulmonary Disease

## 2019-09-13 ENCOUNTER — Telehealth: Payer: Self-pay | Admitting: Pulmonary Disease

## 2019-09-13 DIAGNOSIS — J9621 Acute and chronic respiratory failure with hypoxia: Secondary | ICD-10-CM | POA: Diagnosis not present

## 2019-09-13 DIAGNOSIS — E872 Acidosis: Secondary | ICD-10-CM | POA: Diagnosis not present

## 2019-09-13 DIAGNOSIS — E440 Moderate protein-calorie malnutrition: Secondary | ICD-10-CM | POA: Diagnosis not present

## 2019-09-13 DIAGNOSIS — J9622 Acute and chronic respiratory failure with hypercapnia: Secondary | ICD-10-CM | POA: Diagnosis not present

## 2019-09-13 DIAGNOSIS — E039 Hypothyroidism, unspecified: Secondary | ICD-10-CM | POA: Diagnosis not present

## 2019-09-13 DIAGNOSIS — I6529 Occlusion and stenosis of unspecified carotid artery: Secondary | ICD-10-CM | POA: Diagnosis not present

## 2019-09-13 DIAGNOSIS — I2781 Cor pulmonale (chronic): Secondary | ICD-10-CM | POA: Diagnosis not present

## 2019-09-13 DIAGNOSIS — J441 Chronic obstructive pulmonary disease with (acute) exacerbation: Secondary | ICD-10-CM | POA: Diagnosis not present

## 2019-09-13 DIAGNOSIS — I5032 Chronic diastolic (congestive) heart failure: Secondary | ICD-10-CM | POA: Diagnosis not present

## 2019-09-13 NOTE — Telephone Encounter (Signed)
Spoke with Barrie Folk from [facility] and clarified the doses and directions on the spiriva and albuterol nebulizer medication. Nothing further is needed.

## 2019-09-20 ENCOUNTER — Other Ambulatory Visit: Payer: Self-pay

## 2019-09-20 DIAGNOSIS — J441 Chronic obstructive pulmonary disease with (acute) exacerbation: Secondary | ICD-10-CM | POA: Diagnosis not present

## 2019-09-20 DIAGNOSIS — E872 Acidosis: Secondary | ICD-10-CM | POA: Diagnosis not present

## 2019-09-20 DIAGNOSIS — I2781 Cor pulmonale (chronic): Secondary | ICD-10-CM | POA: Diagnosis not present

## 2019-09-20 DIAGNOSIS — E440 Moderate protein-calorie malnutrition: Secondary | ICD-10-CM | POA: Diagnosis not present

## 2019-09-20 DIAGNOSIS — J9622 Acute and chronic respiratory failure with hypercapnia: Secondary | ICD-10-CM | POA: Diagnosis not present

## 2019-09-20 DIAGNOSIS — E039 Hypothyroidism, unspecified: Secondary | ICD-10-CM | POA: Diagnosis not present

## 2019-09-20 DIAGNOSIS — I6529 Occlusion and stenosis of unspecified carotid artery: Secondary | ICD-10-CM | POA: Diagnosis not present

## 2019-09-20 DIAGNOSIS — I5032 Chronic diastolic (congestive) heart failure: Secondary | ICD-10-CM | POA: Diagnosis not present

## 2019-09-20 DIAGNOSIS — J9621 Acute and chronic respiratory failure with hypoxia: Secondary | ICD-10-CM | POA: Diagnosis not present

## 2019-09-20 NOTE — Patient Outreach (Signed)
[facility]) Care Management  09/20/2019  Jasmine [name] 04-Oct-1946 [id number]   Telephone call to patient for follow up.  No answer. HIPAA compliant voice message left.  Plan: RN CM will contact patient again within 4 business days and send letter.   [name], RN, MSN [facility] Management Care Management Coordinator Direct Line [phone] Cell [phone] Toll Free: [phone]  Fax: [phone]

## 2019-09-21 DIAGNOSIS — E039 Hypothyroidism, unspecified: Secondary | ICD-10-CM | POA: Diagnosis not present

## 2019-09-21 DIAGNOSIS — I6529 Occlusion and stenosis of unspecified carotid artery: Secondary | ICD-10-CM | POA: Diagnosis not present

## 2019-09-21 DIAGNOSIS — J441 Chronic obstructive pulmonary disease with (acute) exacerbation: Secondary | ICD-10-CM | POA: Diagnosis not present

## 2019-09-21 DIAGNOSIS — I2781 Cor pulmonale (chronic): Secondary | ICD-10-CM | POA: Diagnosis not present

## 2019-09-21 DIAGNOSIS — E440 Moderate protein-calorie malnutrition: Secondary | ICD-10-CM | POA: Diagnosis not present

## 2019-09-21 DIAGNOSIS — J9621 Acute and chronic respiratory failure with hypoxia: Secondary | ICD-10-CM | POA: Diagnosis not present

## 2019-09-21 DIAGNOSIS — I5032 Chronic diastolic (congestive) heart failure: Secondary | ICD-10-CM | POA: Diagnosis not present

## 2019-09-21 DIAGNOSIS — E872 Acidosis: Secondary | ICD-10-CM | POA: Diagnosis not present

## 2019-09-21 DIAGNOSIS — J9622 Acute and chronic respiratory failure with hypercapnia: Secondary | ICD-10-CM | POA: Diagnosis not present

## 2019-09-22 ENCOUNTER — Other Ambulatory Visit: Payer: Self-pay | Admitting: Internal Medicine

## 2019-09-22 DIAGNOSIS — Z1231 Encounter for screening mammogram for malignant neoplasm of breast: Secondary | ICD-10-CM

## 2019-09-23 ENCOUNTER — Other Ambulatory Visit: Payer: Self-pay

## 2019-09-23 NOTE — Patient Outreach (Signed)
[facility]) Care Management  09/23/2019  Jasmine [name] 1946-01-29 [id number]   Telephone call to patient for follow up.  No answer.  HIPAA compliant voice message left.    Plan: RN CM will attempt patient again within 4 business days.    [name], RN, MSN [facility] Management Care Management Coordinator Direct Line [phone] Cell [phone] Toll Free: [phone]  Fax: [phone]

## 2019-09-25 DIAGNOSIS — J449 Chronic obstructive pulmonary disease, unspecified: Secondary | ICD-10-CM | POA: Diagnosis not present

## 2019-09-28 ENCOUNTER — Other Ambulatory Visit: Payer: Self-pay

## 2019-09-28 ENCOUNTER — Ambulatory Visit: Payer: Medicare HMO

## 2019-09-28 DIAGNOSIS — E872 Acidosis: Secondary | ICD-10-CM | POA: Diagnosis not present

## 2019-09-28 DIAGNOSIS — E440 Moderate protein-calorie malnutrition: Secondary | ICD-10-CM | POA: Diagnosis not present

## 2019-09-28 DIAGNOSIS — E039 Hypothyroidism, unspecified: Secondary | ICD-10-CM | POA: Diagnosis not present

## 2019-09-28 DIAGNOSIS — J9622 Acute and chronic respiratory failure with hypercapnia: Secondary | ICD-10-CM | POA: Diagnosis not present

## 2019-09-28 DIAGNOSIS — J441 Chronic obstructive pulmonary disease with (acute) exacerbation: Secondary | ICD-10-CM | POA: Diagnosis not present

## 2019-09-28 DIAGNOSIS — I2781 Cor pulmonale (chronic): Secondary | ICD-10-CM | POA: Diagnosis not present

## 2019-09-28 DIAGNOSIS — I5032 Chronic diastolic (congestive) heart failure: Secondary | ICD-10-CM | POA: Diagnosis not present

## 2019-09-28 DIAGNOSIS — J9621 Acute and chronic respiratory failure with hypoxia: Secondary | ICD-10-CM | POA: Diagnosis not present

## 2019-09-28 DIAGNOSIS — I6529 Occlusion and stenosis of unspecified carotid artery: Secondary | ICD-10-CM | POA: Diagnosis not present

## 2019-09-28 NOTE — Patient Outreach (Signed)
[name]) Care Management  09/28/2019  Jasmine [name] 1946-07-18 [id number]   Telephone call to patient for follow up.  No answer.  HIPAA compliant voice message left.    Plan: RN CM will attempt patient again in the month of January.  [name], RN, MSN [facility] Management Care Management Coordinator Direct Line [phone] Cell [phone] Toll Free: [phone]  Fax: [phone]

## 2019-10-03 DIAGNOSIS — J9612 Chronic respiratory failure with hypercapnia: Secondary | ICD-10-CM | POA: Diagnosis not present

## 2019-10-03 DIAGNOSIS — J449 Chronic obstructive pulmonary disease, unspecified: Secondary | ICD-10-CM | POA: Diagnosis not present

## 2019-10-03 DIAGNOSIS — B9561 Methicillin susceptible Staphylococcus aureus infection as the cause of diseases classified elsewhere: Secondary | ICD-10-CM | POA: Diagnosis not present

## 2019-10-03 DIAGNOSIS — R7881 Bacteremia: Secondary | ICD-10-CM | POA: Diagnosis not present

## 2019-10-04 DIAGNOSIS — E872 Acidosis: Secondary | ICD-10-CM | POA: Diagnosis not present

## 2019-10-04 DIAGNOSIS — J9621 Acute and chronic respiratory failure with hypoxia: Secondary | ICD-10-CM | POA: Diagnosis not present

## 2019-10-04 DIAGNOSIS — E039 Hypothyroidism, unspecified: Secondary | ICD-10-CM | POA: Diagnosis not present

## 2019-10-04 DIAGNOSIS — E440 Moderate protein-calorie malnutrition: Secondary | ICD-10-CM | POA: Diagnosis not present

## 2019-10-04 DIAGNOSIS — J441 Chronic obstructive pulmonary disease with (acute) exacerbation: Secondary | ICD-10-CM | POA: Diagnosis not present

## 2019-10-04 DIAGNOSIS — I5032 Chronic diastolic (congestive) heart failure: Secondary | ICD-10-CM | POA: Diagnosis not present

## 2019-10-04 DIAGNOSIS — J9622 Acute and chronic respiratory failure with hypercapnia: Secondary | ICD-10-CM | POA: Diagnosis not present

## 2019-10-04 DIAGNOSIS — I2781 Cor pulmonale (chronic): Secondary | ICD-10-CM | POA: Diagnosis not present

## 2019-10-04 DIAGNOSIS — I6529 Occlusion and stenosis of unspecified carotid artery: Secondary | ICD-10-CM | POA: Diagnosis not present

## 2019-10-06 DIAGNOSIS — E872 Acidosis: Secondary | ICD-10-CM | POA: Diagnosis not present

## 2019-10-06 DIAGNOSIS — I2781 Cor pulmonale (chronic): Secondary | ICD-10-CM | POA: Diagnosis not present

## 2019-10-06 DIAGNOSIS — E039 Hypothyroidism, unspecified: Secondary | ICD-10-CM | POA: Diagnosis not present

## 2019-10-06 DIAGNOSIS — J441 Chronic obstructive pulmonary disease with (acute) exacerbation: Secondary | ICD-10-CM | POA: Diagnosis not present

## 2019-10-06 DIAGNOSIS — J449 Chronic obstructive pulmonary disease, unspecified: Secondary | ICD-10-CM | POA: Diagnosis not present

## 2019-10-06 DIAGNOSIS — R911 Solitary pulmonary nodule: Secondary | ICD-10-CM | POA: Diagnosis not present

## 2019-10-06 DIAGNOSIS — I5032 Chronic diastolic (congestive) heart failure: Secondary | ICD-10-CM | POA: Diagnosis not present

## 2019-10-06 DIAGNOSIS — I6529 Occlusion and stenosis of unspecified carotid artery: Secondary | ICD-10-CM | POA: Diagnosis not present

## 2019-10-06 DIAGNOSIS — E440 Moderate protein-calorie malnutrition: Secondary | ICD-10-CM | POA: Diagnosis not present

## 2019-10-06 DIAGNOSIS — R300 Dysuria: Secondary | ICD-10-CM | POA: Diagnosis not present

## 2019-10-06 DIAGNOSIS — J9622 Acute and chronic respiratory failure with hypercapnia: Secondary | ICD-10-CM | POA: Diagnosis not present

## 2019-10-06 DIAGNOSIS — J9621 Acute and chronic respiratory failure with hypoxia: Secondary | ICD-10-CM | POA: Diagnosis not present

## 2019-10-06 DIAGNOSIS — J9611 Chronic respiratory failure with hypoxia: Secondary | ICD-10-CM | POA: Diagnosis not present

## 2019-10-17 ENCOUNTER — Other Ambulatory Visit: Payer: Self-pay | Admitting: Pulmonary Disease

## 2019-10-17 DIAGNOSIS — I6529 Occlusion and stenosis of unspecified carotid artery: Secondary | ICD-10-CM | POA: Diagnosis not present

## 2019-10-17 DIAGNOSIS — J441 Chronic obstructive pulmonary disease with (acute) exacerbation: Secondary | ICD-10-CM | POA: Diagnosis not present

## 2019-10-17 DIAGNOSIS — J9621 Acute and chronic respiratory failure with hypoxia: Secondary | ICD-10-CM | POA: Diagnosis not present

## 2019-10-17 DIAGNOSIS — J9622 Acute and chronic respiratory failure with hypercapnia: Secondary | ICD-10-CM | POA: Diagnosis not present

## 2019-10-17 DIAGNOSIS — I2781 Cor pulmonale (chronic): Secondary | ICD-10-CM | POA: Diagnosis not present

## 2019-10-17 DIAGNOSIS — E872 Acidosis: Secondary | ICD-10-CM | POA: Diagnosis not present

## 2019-10-17 DIAGNOSIS — E039 Hypothyroidism, unspecified: Secondary | ICD-10-CM | POA: Diagnosis not present

## 2019-10-17 DIAGNOSIS — E440 Moderate protein-calorie malnutrition: Secondary | ICD-10-CM | POA: Diagnosis not present

## 2019-10-17 DIAGNOSIS — I5032 Chronic diastolic (congestive) heart failure: Secondary | ICD-10-CM | POA: Diagnosis not present

## 2019-10-26 DIAGNOSIS — J449 Chronic obstructive pulmonary disease, unspecified: Secondary | ICD-10-CM | POA: Diagnosis not present

## 2019-10-28 ENCOUNTER — Other Ambulatory Visit: Payer: Self-pay

## 2019-10-28 NOTE — Patient Outreach (Signed)
[facility]) Care Management  10/28/2019  Jasmine [name] [birth year] [id number]   Telephone call for follow up with patient.  No answer.  HIPAA compliant voice message left.  Plan: RN CM will attempt patient in the month of February.    [name], RN, MSN [place] Management Care Management Coordinator Direct Line [phone] Cell [phone] Toll Free: [phone]  Fax: [phone]

## 2019-10-31 ENCOUNTER — Telehealth: Payer: Self-pay

## 2019-10-31 NOTE — Telephone Encounter (Signed)
Faxed and confirmed medical records request to medical records department [phone]

## 2019-11-03 ENCOUNTER — Ambulatory Visit: Payer: Self-pay

## 2019-11-03 DIAGNOSIS — R7881 Bacteremia: Secondary | ICD-10-CM | POA: Diagnosis not present

## 2019-11-03 DIAGNOSIS — B9561 Methicillin susceptible Staphylococcus aureus infection as the cause of diseases classified elsewhere: Secondary | ICD-10-CM | POA: Diagnosis not present

## 2019-11-03 DIAGNOSIS — J449 Chronic obstructive pulmonary disease, unspecified: Secondary | ICD-10-CM | POA: Diagnosis not present

## 2019-11-03 DIAGNOSIS — J9612 Chronic respiratory failure with hypercapnia: Secondary | ICD-10-CM | POA: Diagnosis not present

## 2019-11-06 DIAGNOSIS — J9611 Chronic respiratory failure with hypoxia: Secondary | ICD-10-CM | POA: Diagnosis not present

## 2019-11-06 DIAGNOSIS — J449 Chronic obstructive pulmonary disease, unspecified: Secondary | ICD-10-CM | POA: Diagnosis not present

## 2019-11-06 DIAGNOSIS — R911 Solitary pulmonary nodule: Secondary | ICD-10-CM | POA: Diagnosis not present

## 2019-11-06 DIAGNOSIS — R300 Dysuria: Secondary | ICD-10-CM | POA: Diagnosis not present

## 2019-11-14 ENCOUNTER — Ambulatory Visit
Admission: RE | Admit: 2019-11-14 | Discharge: 2019-11-14 | Disposition: A | Discharge status: Home / Self Care | Payer: Medicare HMO | Source: Ambulatory Visit | Attending: Internal Medicine | Admitting: Internal Medicine

## 2019-11-14 ENCOUNTER — Other Ambulatory Visit: Payer: Self-pay

## 2019-11-14 DIAGNOSIS — Z1231 Encounter for screening mammogram for malignant neoplasm of breast: Secondary | ICD-10-CM | POA: Diagnosis not present

## 2019-11-15 ENCOUNTER — Other Ambulatory Visit: Payer: Self-pay | Admitting: Pulmonary Disease

## 2019-11-24 ENCOUNTER — Telehealth: Payer: Self-pay | Admitting: Internal Medicine

## 2019-11-24 NOTE — Telephone Encounter (Signed)
Is looking for order number [id number].  Dated 08/28/19.   Jasmine [name] is going to refax to [facility].  Please be on the lookout.

## 2019-11-26 DIAGNOSIS — J449 Chronic obstructive pulmonary disease, unspecified: Secondary | ICD-10-CM | POA: Diagnosis not present

## 2019-11-29 ENCOUNTER — Ambulatory Visit: Payer: Medicare HMO

## 2019-11-30 ENCOUNTER — Encounter: Payer: Self-pay | Admitting: Internal Medicine

## 2019-11-30 ENCOUNTER — Ambulatory Visit (INDEPENDENT_AMBULATORY_CARE_PROVIDER_SITE_OTHER): Payer: Medicare HMO | Admitting: Internal Medicine

## 2019-11-30 DIAGNOSIS — Z0000 Encounter for general adult medical examination without abnormal findings: Secondary | ICD-10-CM

## 2019-11-30 MED ORDER — ALBUTEROL SULFATE (2.5 MG/3ML) 0.083% IN NEBU: 2.5000 mg | INHALATION_SOLUTION | Freq: Four times a day (QID) | RESPIRATORY_TRACT | 0 refills | Status: DC | PRN

## 2019-11-30 MED ORDER — FUROSEMIDE 40 MG PO TABS: ORAL_TABLET | ORAL | 3 refills | Status: DC

## 2019-11-30 MED ORDER — CITALOPRAM HYDROBROMIDE 20 MG PO TABS: ORAL_TABLET | ORAL | 3 refills | Status: DC

## 2019-11-30 MED ORDER — FLUTICASONE FUROATE-VILANTEROL 200-25 MCG/INH IN AEPB: 1.0000 | INHALATION_SPRAY | Freq: Every day | RESPIRATORY_TRACT | 5 refills | Status: DC

## 2019-11-30 MED ORDER — TRAMADOL HCL 50 MG PO TABS: 50.0000 mg | ORAL_TABLET | Freq: Two times a day (BID) | ORAL | 2 refills | Status: DC | PRN

## 2019-11-30 MED ORDER — LEVOTHYROXINE SODIUM 75 MCG PO TABS: ORAL_TABLET | ORAL | 3 refills | Status: DC

## 2019-11-30 MED ORDER — ALBUTEROL SULFATE HFA 108 (90 BASE) MCG/ACT IN AERS: INHALATION_SPRAY | RESPIRATORY_TRACT | 5 refills | Status: DC

## 2019-11-30 MED ORDER — SPIRIVA HANDIHALER 18 MCG IN CAPS: ORAL_CAPSULE | RESPIRATORY_TRACT | 3 refills | Status: DC

## 2019-11-30 MED ORDER — ATORVASTATIN CALCIUM 10 MG PO TABS: ORAL_TABLET | ORAL | 3 refills | Status: DC

## 2019-11-30 MED ORDER — POTASSIUM CHLORIDE ER 10 MEQ PO TBCR: EXTENDED_RELEASE_TABLET | ORAL | 3 refills | Status: DC

## 2019-11-30 NOTE — Progress Notes (Signed)
Patient ID: Jasmine [name], female   DOB: [birth year], 74 y.o.   MRN: [id number]  Virtual Visit via Video Note  I connected with [name] on 11/30/19 at  2:20 PM EST by a video enabled telemedicine application and verified that I am speaking with the correct person using two identifiers.  Location: Patient: at home Provider: at office   I discussed the limitations of evaluation and management by telemedicine and the availability of in person appointments. The patient expressed understanding and agreed to proceed.  History of Present Illness: Here for wellness and f/u;  Overall doing ok;  Pt denies Chest pain, worsening SOB, DOE, wheezing, orthopnea, PND, worsening LE edema, palpitations, dizziness or syncope.  Pt denies neurological change such as new headache, facial or extremity weakness.  Pt denies polydipsia, polyuria, or low sugar symptoms. Pt states overall good compliance with treatment and medications, good tolerability, and has been trying to follow appropriate diet.  Pt denies worsening depressive symptoms, suicidal ideation or panic. No fever, night sweats, wt loss, loss of appetite, or other constitutional symptoms.  Pt states good ability with ADL's, has low fall risk, home safety reviewed and adequate, no other significant changes in hearing or vision.  Has occasional msk pain such as neck and knees.   Past Medical History:  Diagnosis Date  . ALLERGIC RHINITIS 05/01/2007  . Anxiety   . Arthritis    "hands" (12/28/2015)  . ASTHMA 05/01/2007   "since I was a child"  . Cancer (HCC)    LUNG  . CHF (congestive heart failure) ([place])   . Chronic bronchitis ([place])   . COPD (chronic obstructive pulmonary disease) ([place])   . DEPRESSION 07/21/2008  . DVT (deep venous thrombosis) (HCC) 1960s   LLE  . FATIGUE 10/10/2010  . HYPERTENSION 05/01/2007  . HYPERTHYROIDISM 11/23/2007   Pt endorses having had Graves disease, possibly radioactive iodine x1, but no thyroidectomy  . HYPOTHYROIDISM  08/23/2009  . LUMBAR RADICULOPATHY, RIGHT 08/25/2008  . On home oxygen therapy    "3-4L; qd; all the time" (12/28/2015)  . OSTEOPOROSIS 05/01/2007  . SHOULDER PAIN, LEFT 08/23/2009  . SINUSITIS, CHRONIC 10/10/2010   Past Surgical History:  Procedure Laterality Date  . APPENDECTOMY    . DILATION AND CURETTAGE OF UTERUS  multiple   history of multiple dialations and curettages and miscarriages, unfortunately never carrying a child to term  . ECTOPIC PREGNANCY SURGERY  "late '60s or early '70s  . ELECTROCARDIOGRAM  06/20/2006  . OOPHORECTOMY Right     reports that she quit smoking about 3 years ago. Her smoking use included cigarettes. She started smoking about 56 years ago. She has a 52.00 pack-year smoking history. She has never used smokeless tobacco. She reports previous alcohol use. She reports previous drug use. family history includes Alcohol abuse in her brother; Asthma in an other family member; Diabetes in her brother; Hypertension in an other family member; Lung cancer in her father; Stroke in her mother; Thyroid disease in her mother. Allergies  Allergen Reactions  . Alendronate Sodium Other (See Comments)    Patient does not remember this reaction   Current Outpatient Medications on File Prior to Visit  Medication Sig Dispense Refill  . albuterol (PROVENTIL) (2.5 MG/3ML) 0.083% nebulizer solution Take 3 mLs (2.5 mg total) by nebulization every 6 (six) hours as needed for wheezing or shortness of breath. 75 mL 0  . albuterol (VENTOLIN HFA) 108 (90 Base) MCG/ACT inhaler INHALE 1 TO 2 PUFFS INTO THE  LUNGS EVERY 6 (SIX) HOURS AS NEEDED FOR WHEEZING OR SHORTNESS OF BREATH. 18 g 0  . aspirin EC 81 MG EC tablet Take 1 tablet (81 mg total) by mouth daily. 30 tablet 0  . atorvastatin (LIPITOR) 10 MG tablet NEW PRESCRIPTION REQUEST: ATORVASTATIN 10MG  - TAKE ONE TABLET BY MOUTH IN THE EVENING 90 tablet 0  . citalopram (CELEXA) 20 MG tablet NEW PRESCRIPTION REQUEST: CITALOPRAM 20MG  - TAKE ONE  TABLET BY MOUTH DAILY 90 tablet 1  . fluticasone furoate-vilanterol (BREO ELLIPTA) 200-25 MCG/INH AEPB Inhale 1 puff into the lungs daily. 1 each 5  . furosemide (LASIX) 40 MG tablet NEW PRESCRIPTION REQUEST: FUROSEMIDE 40MG  - TAKE ONE TABLET BY MOUTH IN THE MORNING AND TAKE ONE TABLET BY MOUTH AS NEEDED IN THE EVENING 180 tablet 3  . levothyroxine (SYNTHROID) 75 MCG tablet NEW PRESCRIPTION REQUEST: LEVOTHYROXINE 75MCG - TAKE ONE TABLET BY MOUTH EARLY MORNING 90 tablet 1  . OXYGEN Inhale 3 L into the lungs. 24/7    . potassium chloride (KLOR-CON) 10 MEQ tablet NEW PRESCRIPTION REQUEST: KLOR-CON 10MEQ - TAKE ONE TABLET BY MOUTH TWICE DAILY 180 tablet 3  . SPIRIVA HANDIHALER 18 MCG inhalation capsule NEW PRESCRIPTION REQUEST: SPIRIVA 18MCG HANDIHALER - TAKE TWO INHALATIONS OF THE CONTENTS OF ONE CAPSULE BY ONCE DAILY 90 capsule 0  . traMADol (ULTRAM) 50 MG tablet Take 1 tablet (50 mg total) by mouth 2 (two) times daily as needed. (Patient taking differently: Take 50 mg by mouth 2 (two) times daily as needed for moderate pain. ) 60 tablet 2   No current facility-administered medications on file prior to visit.    Observations/Objective: Alert, NAD, appropriate mood and affect, resps normal, cn 2-12 intact, moves all 4s, no visible rash or swelling Lab Results  Component Value Date   WBC 7.4 08/18/2019   HGB 12.8 08/18/2019   HCT 40.7 08/18/2019   PLT 163 08/18/2019   GLUCOSE 100 (H) 08/18/2019   CHOL 250 (H) 09/20/2018   TRIG 62.0 09/20/2018   HDL 98.60 09/20/2018   LDLDIRECT 102.1 10/10/2010   LDLCALC 139 (H) 09/20/2018   ALT 13 08/16/2019   AST 29 08/16/2019   NA 136 08/18/2019   K 3.7 08/18/2019   CL 88 (L) 08/18/2019   CREATININE 0.66 08/18/2019   BUN 17 08/18/2019   CO2 39 (H) 08/18/2019   TSH 0.272 (L) 03/06/2019   INR 1.58 (H) 03/25/2016   HGBA1C 6.1 09/20/2018   Assessment and Plan: See notes  Follow Up Instructions: See notes   I discussed the assessment and treatment  plan with the patient. The patient was provided an opportunity to ask questions and all were answered. The patient agreed with the plan and demonstrated an understanding of the instructions.   The patient was advised to call back or seek an in-person evaluation if the symptoms worsen or if the condition fails to improve as anticipated.  [name], MD

## 2019-11-30 NOTE — Assessment & Plan Note (Signed)

## 2019-11-30 NOTE — Patient Instructions (Addendum)
You are highly encouraged to have the COVID vaccine due to your age and COPD  Please continue all other medications as before, and refills have been done if requested.  Please have the pharmacy call with any other refills you may need.  Please continue your efforts at being more active, low cholesterol diet, and weight control.  You are otherwise up to date with prevention measures today.  Please keep your appointments with your specialists as you may have planned - pulmonary  We can hold on lab work until next visit  Please make an Appointment to return in 4 months, or sooner if needed

## 2019-12-02 ENCOUNTER — Telehealth: Payer: Self-pay | Admitting: Internal Medicine

## 2019-12-02 ENCOUNTER — Other Ambulatory Visit: Payer: Self-pay

## 2019-12-02 NOTE — Telephone Encounter (Signed)
Tried to reach patient to schedule 4 month follow up.  Patient does not have a VM set up.

## 2019-12-02 NOTE — Patient Outreach (Signed)
[facility]) Care Management  12/02/2019  ANIELA MISA 24-May-1946 [id number]   Telephone call to patient for monthly follow up.  No answer.  Unable to leave a message.   Plan: RN CM will attempt patient again in the month of March.   [name], RN, MSN [facility] Management Care Management Coordinator Direct Line [phone] Cell [phone] Toll Free: [phone]  Fax: [phone]

## 2019-12-04 ENCOUNTER — Emergency Department (HOSPITAL_COMMUNITY): Payer: Medicare HMO

## 2019-12-04 ENCOUNTER — Inpatient Hospital Stay (HOSPITAL_COMMUNITY)
Admission: EM | Admit: 2019-12-04 | Discharge: 2019-12-07 | DRG: 189 | Disposition: A | Discharge status: Home Health | Payer: Medicare HMO | Attending: Student | Admitting: Student

## 2019-12-04 ENCOUNTER — Other Ambulatory Visit: Payer: Self-pay

## 2019-12-04 ENCOUNTER — Encounter (HOSPITAL_COMMUNITY): Payer: Self-pay | Admitting: Emergency Medicine

## 2019-12-04 DIAGNOSIS — Z79899 Other long term (current) drug therapy: Secondary | ICD-10-CM

## 2019-12-04 DIAGNOSIS — J9602 Acute respiratory failure with hypercapnia: Secondary | ICD-10-CM | POA: Diagnosis not present

## 2019-12-04 DIAGNOSIS — Z9981 Dependence on supplemental oxygen: Secondary | ICD-10-CM | POA: Diagnosis not present

## 2019-12-04 DIAGNOSIS — Z79890 Hormone replacement therapy (postmenopausal): Secondary | ICD-10-CM | POA: Diagnosis not present

## 2019-12-04 DIAGNOSIS — B9561 Methicillin susceptible Staphylococcus aureus infection as the cause of diseases classified elsewhere: Secondary | ICD-10-CM | POA: Diagnosis not present

## 2019-12-04 DIAGNOSIS — J9621 Acute and chronic respiratory failure with hypoxia: Secondary | ICD-10-CM | POA: Diagnosis not present

## 2019-12-04 DIAGNOSIS — E039 Hypothyroidism, unspecified: Secondary | ICD-10-CM | POA: Diagnosis present

## 2019-12-04 DIAGNOSIS — Z823 Family history of stroke: Secondary | ICD-10-CM

## 2019-12-04 DIAGNOSIS — R0602 Shortness of breath: Secondary | ICD-10-CM | POA: Diagnosis not present

## 2019-12-04 DIAGNOSIS — Z801 Family history of malignant neoplasm of trachea, bronchus and lung: Secondary | ICD-10-CM

## 2019-12-04 DIAGNOSIS — R300 Dysuria: Secondary | ICD-10-CM | POA: Diagnosis not present

## 2019-12-04 DIAGNOSIS — Z888 Allergy status to other drugs, medicaments and biological substances status: Secondary | ICD-10-CM

## 2019-12-04 DIAGNOSIS — J9601 Acute respiratory failure with hypoxia: Secondary | ICD-10-CM | POA: Diagnosis present

## 2019-12-04 DIAGNOSIS — F419 Anxiety disorder, unspecified: Secondary | ICD-10-CM | POA: Diagnosis present

## 2019-12-04 DIAGNOSIS — Z7951 Long term (current) use of inhaled steroids: Secondary | ICD-10-CM

## 2019-12-04 DIAGNOSIS — J441 Chronic obstructive pulmonary disease with (acute) exacerbation: Secondary | ICD-10-CM | POA: Diagnosis present

## 2019-12-04 DIAGNOSIS — F329 Major depressive disorder, single episode, unspecified: Secondary | ICD-10-CM | POA: Diagnosis present

## 2019-12-04 DIAGNOSIS — Z7982 Long term (current) use of aspirin: Secondary | ICD-10-CM

## 2019-12-04 DIAGNOSIS — Z87891 Personal history of nicotine dependence: Secondary | ICD-10-CM | POA: Diagnosis not present

## 2019-12-04 DIAGNOSIS — J9611 Chronic respiratory failure with hypoxia: Secondary | ICD-10-CM | POA: Diagnosis not present

## 2019-12-04 DIAGNOSIS — Z833 Family history of diabetes mellitus: Secondary | ICD-10-CM

## 2019-12-04 DIAGNOSIS — R911 Solitary pulmonary nodule: Secondary | ICD-10-CM | POA: Diagnosis not present

## 2019-12-04 DIAGNOSIS — E875 Hyperkalemia: Secondary | ICD-10-CM | POA: Diagnosis present

## 2019-12-04 DIAGNOSIS — Z85118 Personal history of other malignant neoplasm of bronchus and lung: Secondary | ICD-10-CM

## 2019-12-04 DIAGNOSIS — R7881 Bacteremia: Secondary | ICD-10-CM | POA: Diagnosis not present

## 2019-12-04 DIAGNOSIS — E873 Alkalosis: Secondary | ICD-10-CM | POA: Diagnosis present

## 2019-12-04 DIAGNOSIS — E785 Hyperlipidemia, unspecified: Secondary | ICD-10-CM | POA: Diagnosis present

## 2019-12-04 DIAGNOSIS — G9341 Metabolic encephalopathy: Secondary | ICD-10-CM | POA: Diagnosis present

## 2019-12-04 DIAGNOSIS — I5032 Chronic diastolic (congestive) heart failure: Secondary | ICD-10-CM | POA: Diagnosis not present

## 2019-12-04 DIAGNOSIS — Z66 Do not resuscitate: Secondary | ICD-10-CM | POA: Diagnosis present

## 2019-12-04 DIAGNOSIS — R531 Weakness: Secondary | ICD-10-CM | POA: Diagnosis not present

## 2019-12-04 DIAGNOSIS — Z720 Tobacco use: Secondary | ICD-10-CM | POA: Diagnosis not present

## 2019-12-04 DIAGNOSIS — I110 Hypertensive heart disease with heart failure: Secondary | ICD-10-CM | POA: Diagnosis present

## 2019-12-04 DIAGNOSIS — Z8349 Family history of other endocrine, nutritional and metabolic diseases: Secondary | ICD-10-CM

## 2019-12-04 DIAGNOSIS — Z8249 Family history of ischemic heart disease and other diseases of the circulatory system: Secondary | ICD-10-CM

## 2019-12-04 DIAGNOSIS — J9622 Acute and chronic respiratory failure with hypercapnia: Secondary | ICD-10-CM

## 2019-12-04 DIAGNOSIS — Z20822 Contact with and (suspected) exposure to covid-19: Secondary | ICD-10-CM | POA: Diagnosis present

## 2019-12-04 DIAGNOSIS — Z825 Family history of asthma and other chronic lower respiratory diseases: Secondary | ICD-10-CM

## 2019-12-04 DIAGNOSIS — J449 Chronic obstructive pulmonary disease, unspecified: Secondary | ICD-10-CM | POA: Diagnosis not present

## 2019-12-04 DIAGNOSIS — J9612 Chronic respiratory failure with hypercapnia: Secondary | ICD-10-CM | POA: Diagnosis not present

## 2019-12-04 DIAGNOSIS — I10 Essential (primary) hypertension: Secondary | ICD-10-CM | POA: Diagnosis not present

## 2019-12-04 LAB — BLOOD GAS, ARTERIAL
Acid-Base Excess: 20.1 mmol/L — ABNORMAL HIGH (ref 0.0–2.0)
Acid-Base Excess: 17.6 mmol/L — ABNORMAL HIGH (ref 0.0–2.0)
Bicarbonate: 49.2 mmol/L — ABNORMAL HIGH (ref 20.0–28.0)
Bicarbonate: 45.4 mmol/L — ABNORMAL HIGH (ref 20.0–28.0)
Drawn by: 53309
Drawn by: 53309
FIO2: 32
FIO2: 28
O2 Saturation: 95.7 %
O2 Saturation: 97.5 %
Patient temperature: 36.6
Patient temperature: 36.7
pCO2 arterial: >120 mmHg (ref 32.0–48.0)
pCO2 arterial: 107 mmHg (ref 32.0–48.0)
pH, Arterial: 7.164 — CL (ref 7.350–7.450)
pH, Arterial: 7.25 — ABNORMAL LOW (ref 7.350–7.450)
pO2, Arterial: 93.3 mmHg (ref 83.0–108.0)
pO2, Arterial: 101 mmHg (ref 83.0–108.0)

## 2019-12-04 LAB — CBC
HCT: 51.3 % — ABNORMAL HIGH (ref 36.0–46.0)
Hemoglobin: 14.6 g/dL (ref 12.0–15.0)
MCH: 31.7 pg (ref 26.0–34.0)
MCHC: 28.5 g/dL — ABNORMAL LOW (ref 30.0–36.0)
MCV: 111.5 fL — ABNORMAL HIGH (ref 80.0–100.0)
Platelets: 174 10*3/uL (ref 150–400)
RBC: 4.6 MIL/uL (ref 3.87–5.11)
RDW: 13.1 % (ref 11.5–15.5)
WBC: 5.9 10*3/uL (ref 4.0–10.5)
nRBC: 0 % (ref 0.0–0.2)

## 2019-12-04 LAB — URINALYSIS, ROUTINE W REFLEX MICROSCOPIC
Bacteria, UA: NONE SEEN
Bilirubin Urine: NEGATIVE
Glucose, UA: NEGATIVE mg/dL
Hgb urine dipstick: NEGATIVE
Ketones, ur: 5 mg/dL — AB
Leukocytes,Ua: NEGATIVE
Nitrite: NEGATIVE
Protein, ur: 30 mg/dL — AB
Specific Gravity, Urine: 1.023 (ref 1.005–1.030)
pH: 6 (ref 5.0–8.0)

## 2019-12-04 LAB — COMPREHENSIVE METABOLIC PANEL
ALT: 19 U/L (ref 0–44)
AST: 29 U/L (ref 15–41)
Albumin: 4.3 g/dL (ref 3.5–5.0)
Alkaline Phosphatase: 50 U/L (ref 38–126)
Anion gap: 11 (ref 5–15)
BUN: 10 mg/dL (ref 8–23)
CO2: 43 mmol/L — ABNORMAL HIGH (ref 22–32)
Calcium: 9.8 mg/dL (ref 8.9–10.3)
Chloride: 87 mmol/L — ABNORMAL LOW (ref 98–111)
Creatinine, Ser: 0.74 mg/dL (ref 0.44–1.00)
GFR calc Af Amer: >60 mL/min (ref >60)
GFR calc non Af Amer: >60 mL/min (ref >60)
Glucose, Bld: 132 mg/dL — ABNORMAL HIGH (ref 70–99)
Potassium: 5.4 mmol/L — ABNORMAL HIGH (ref 3.5–5.1)
Sodium: 141 mmol/L (ref 135–145)
Total Bilirubin: 1 mg/dL (ref 0.3–1.2)
Total Protein: 8 g/dL (ref 6.5–8.1)

## 2019-12-04 LAB — I-STAT BETA HCG BLOOD, ED (MC, WL, AP ONLY): I-stat hCG, quantitative: <5 m[IU]/mL (ref <5)

## 2019-12-04 LAB — HIV ANTIBODY (ROUTINE TESTING W REFLEX): HIV Screen 4th Generation wRfx: NONREACTIVE

## 2019-12-04 LAB — RESPIRATORY PANEL BY RT PCR (FLU A&B, COVID)
Influenza A by PCR: NEGATIVE
Influenza B by PCR: NEGATIVE
SARS Coronavirus 2 by RT PCR: NEGATIVE

## 2019-12-04 MED ORDER — POTASSIUM CHLORIDE ER 10 MEQ PO TBCR: 10.0000 meq | EXTENDED_RELEASE_TABLET | Freq: Two times a day (BID) | ORAL | Status: DC

## 2019-12-04 MED ORDER — ASPIRIN EC 81 MG PO TBEC
81.0000 mg | DELAYED_RELEASE_TABLET | Freq: Every day | ORAL | Status: DC
  Administered 2019-12-05 – 2019-12-07 (×3): 81 mg via ORAL
  Filled 2019-12-04 (×5): qty 1

## 2019-12-04 MED ORDER — CITALOPRAM HYDROBROMIDE 20 MG PO TABS
20.0000 mg | ORAL_TABLET | Freq: Every day | ORAL | Status: DC
  Administered 2019-12-05 – 2019-12-07 (×3): 20 mg via ORAL
  Filled 2019-12-04 (×3): qty 1

## 2019-12-04 MED ORDER — IPRATROPIUM BROMIDE HFA 17 MCG/ACT IN AERS
2.0000 | INHALATION_SPRAY | Freq: Once | RESPIRATORY_TRACT | Status: AC
  Administered 2019-12-04: 12:00:00 2 via RESPIRATORY_TRACT
  Filled 2019-12-04: qty 12.9

## 2019-12-04 MED ORDER — ALBUTEROL SULFATE HFA 108 (90 BASE) MCG/ACT IN AERS: 2.0000 | INHALATION_SPRAY | Freq: Four times a day (QID) | RESPIRATORY_TRACT | Status: DC | PRN

## 2019-12-04 MED ORDER — ALBUTEROL SULFATE HFA 108 (90 BASE) MCG/ACT IN AERS
4.0000 | INHALATION_SPRAY | Freq: Once | RESPIRATORY_TRACT | Status: DC
  Filled 2019-12-04: qty 6.7

## 2019-12-04 MED ORDER — FLUTICASONE FUROATE-VILANTEROL 200-25 MCG/INH IN AEPB
1.0000 | INHALATION_SPRAY | Freq: Every day | RESPIRATORY_TRACT | Status: DC
  Administered 2019-12-05 – 2019-12-07 (×3): 1 via RESPIRATORY_TRACT
  Filled 2019-12-04: qty 28

## 2019-12-04 MED ORDER — ALBUTEROL (5 MG/ML) CONTINUOUS INHALATION SOLN
15.0000 mg/h | INHALATION_SOLUTION | RESPIRATORY_TRACT | Status: DC
  Administered 2019-12-04: 14:00:00 15 mg/h via RESPIRATORY_TRACT
  Filled 2019-12-04: qty 20

## 2019-12-04 MED ORDER — ATORVASTATIN CALCIUM 10 MG PO TABS
10.0000 mg | ORAL_TABLET | Freq: Every day | ORAL | Status: DC
  Administered 2019-12-05 – 2019-12-07 (×3): 10 mg via ORAL
  Filled 2019-12-04 (×3): qty 1

## 2019-12-04 MED ORDER — ACETAMINOPHEN 650 MG RE SUPP: 650.0000 mg | Freq: Four times a day (QID) | RECTAL | Status: DC | PRN

## 2019-12-04 MED ORDER — ALBUTEROL SULFATE HFA 108 (90 BASE) MCG/ACT IN AERS
4.0000 | INHALATION_SPRAY | Freq: Once | RESPIRATORY_TRACT | Status: AC
  Administered 2019-12-04: 12:00:00 4 via RESPIRATORY_TRACT
  Filled 2019-12-04: qty 6.7

## 2019-12-04 MED ORDER — LEVOTHYROXINE SODIUM 75 MCG PO TABS
75.0000 ug | ORAL_TABLET | Freq: Every day | ORAL | Status: DC
  Administered 2019-12-07: 75 ug via ORAL
  Filled 2019-12-04: qty 1

## 2019-12-04 MED ORDER — SODIUM CHLORIDE 0.9 % IV SOLN: INTRAVENOUS | Status: AC

## 2019-12-04 MED ORDER — ALBUTEROL (5 MG/ML) CONTINUOUS INHALATION SOLN
10.0000 mg/h | INHALATION_SOLUTION | RESPIRATORY_TRACT | Status: DC
  Administered 2019-12-04: 15:00:00 10 mg/h via RESPIRATORY_TRACT
  Filled 2019-12-04: qty 20

## 2019-12-04 MED ORDER — IPRATROPIUM BROMIDE HFA 17 MCG/ACT IN AERS
2.0000 | INHALATION_SPRAY | Freq: Once | RESPIRATORY_TRACT | Status: DC
  Filled 2019-12-04: qty 12.9

## 2019-12-04 MED ORDER — ALBUTEROL SULFATE (2.5 MG/3ML) 0.083% IN NEBU
2.5000 mg | INHALATION_SOLUTION | Freq: Four times a day (QID) | RESPIRATORY_TRACT | Status: DC | PRN
  Administered 2019-12-05 – 2019-12-06 (×2): 2.5 mg via RESPIRATORY_TRACT

## 2019-12-04 MED ORDER — IPRATROPIUM BROMIDE 0.02 % IN SOLN
0.5000 mg | Freq: Once | RESPIRATORY_TRACT | Status: AC
  Administered 2019-12-04: 14:00:00 0.5 mg via RESPIRATORY_TRACT
  Filled 2019-12-04: qty 2.5

## 2019-12-04 MED ORDER — AEROCHAMBER PLUS FLO-VU LARGE MISC
Status: AC
  Administered 2019-12-04: 13:00:00 1
  Filled 2019-12-04: qty 1

## 2019-12-04 MED ORDER — FUROSEMIDE 20 MG PO TABS: 40.0000 mg | ORAL_TABLET | Freq: Two times a day (BID) | ORAL | Status: DC

## 2019-12-04 MED ORDER — UMECLIDINIUM BROMIDE 62.5 MCG/INH IN AEPB
1.0000 | INHALATION_SPRAY | Freq: Every day | RESPIRATORY_TRACT | Status: DC
  Administered 2019-12-05 – 2019-12-07 (×3): 1 via RESPIRATORY_TRACT
  Filled 2019-12-04: qty 7

## 2019-12-04 MED ORDER — ACETAMINOPHEN 325 MG PO TABS: 650.0000 mg | ORAL_TABLET | Freq: Four times a day (QID) | ORAL | Status: DC | PRN

## 2019-12-04 MED ORDER — PREDNISONE 20 MG PO TABS
40.0000 mg | ORAL_TABLET | Freq: Every day | ORAL | Status: DC
  Administered 2019-12-05 – 2019-12-07 (×3): 40 mg via ORAL
  Filled 2019-12-04 (×3): qty 2

## 2019-12-04 MED ORDER — HEPARIN SODIUM (PORCINE) 5000 UNIT/ML IJ SOLN
5000.0000 [IU] | Freq: Two times a day (BID) | INTRAMUSCULAR | Status: DC
  Administered 2019-12-04 – 2019-12-06 (×5): 5000 [IU] via SUBCUTANEOUS
  Filled 2019-12-04 (×6): qty 1

## 2019-12-04 MED ORDER — METHYLPREDNISOLONE SODIUM SUCC 125 MG IJ SOLR
80.0000 mg | Freq: Once | INTRAMUSCULAR | Status: AC
  Administered 2019-12-04: 13:00:00 80 mg via INTRAVENOUS
  Filled 2019-12-04: qty 2

## 2019-12-04 MED ORDER — TRAMADOL HCL 50 MG PO TABS
50.0000 mg | ORAL_TABLET | Freq: Two times a day (BID) | ORAL | Status: DC
  Administered 2019-12-05 – 2019-12-07 (×5): 50 mg via ORAL
  Filled 2019-12-04 (×5): qty 1

## 2019-12-04 NOTE — H&P (Signed)
History and Physical    Jasmine [name] [id number] DOB: 11/20/1945 DOA: 12/04/2019  PCP: Biagio Borg, MD   Patient coming from: Home  I have personally briefly reviewed patient's old medical records in [place]  Chief Complaint: SOB  HPI: Jasmine [name] is a 74 y.o. female with medical history significant of Oxygen dependent, baseline 3 L and BIPAP HS, has not been able to use the BIPAP for last two nights because of the power outage. Started to very sleepy and SOB this morning. She was recently just seen by her PCP 3 days ago and was doing ok at that point. Pt very sleepy, most of the Hx was provided by her spouse over the phone. She did have some dry cough and wheezing since last night, but no fevers. No chest pain. No leg pain or swelling.  ED Course: ABG PH 7.16 and PCO2 120, pt DNR, so was placed on BIPAP 16/5.  Review of Systems: Unable to perform due to mental status changes.    Past Medical History:  Diagnosis Date  . ALLERGIC RHINITIS 05/01/2007  . Anxiety   . Arthritis    "hands" (12/28/2015)  . ASTHMA 05/01/2007   "since I was a child"  . Cancer (HCC)    LUNG  . CHF (congestive heart failure) ([place])   . Chronic bronchitis ([place])   . COPD (chronic obstructive pulmonary disease) ([place])   . DEPRESSION 07/21/2008  . DVT (deep venous thrombosis) (HCC) 1960s   LLE  . FATIGUE 10/10/2010  . HYPERTENSION 05/01/2007  . HYPERTHYROIDISM 11/23/2007   Pt endorses having had Graves disease, possibly radioactive iodine x1, but no thyroidectomy  . HYPOTHYROIDISM 08/23/2009  . LUMBAR RADICULOPATHY, RIGHT 08/25/2008  . On home oxygen therapy    "3-4L; qd; all the time" (12/28/2015)  . OSTEOPOROSIS 05/01/2007  . SHOULDER PAIN, LEFT 08/23/2009  . SINUSITIS, CHRONIC 10/10/2010    Past Surgical History:  Procedure Laterality Date  . APPENDECTOMY    . DILATION AND CURETTAGE OF UTERUS  multiple   history of multiple dialations and curettages and miscarriages, unfortunately never  carrying a child to term  . ECTOPIC PREGNANCY SURGERY  "late '60s or early '70s  . ELECTROCARDIOGRAM  06/20/2006  . OOPHORECTOMY Right      reports that she quit smoking about 3 years ago. Her smoking use included cigarettes. She started smoking about 56 years ago. She has a 52.00 pack-year smoking history. She has never used smokeless tobacco. She reports previous alcohol use. She reports previous drug use.  Allergies  Allergen Reactions  . Alendronate Sodium Other (See Comments)    Patient does not remember this reaction    Family History  Problem Relation Age of Onset  . Lung cancer Father   . Alcohol abuse Brother   . Diabetes Brother   . Hypertension Other   . Stroke Mother   . Thyroid disease Mother   . Asthma Other        maternal aunts  . Breast cancer Neg Hx      Prior to Admission medications   Medication Sig Start Date End Date Taking? Authorizing Provider  albuterol (PROVENTIL) (2.5 MG/3ML) 0.083% nebulizer solution Take 3 mLs (2.5 mg total) by nebulization every 6 (six) hours as needed for wheezing or shortness of breath. 11/30/19  Yes Biagio Borg, MD  albuterol (VENTOLIN HFA) 108 (90 Base) MCG/ACT inhaler INHALE 1 TO 2 PUFFS INTO THE LUNGS EVERY 6 (SIX) HOURS AS NEEDED FOR  WHEEZING OR SHORTNESS OF BREATH. Patient taking differently: Inhale 2 puffs into the lungs every 6 (six) hours as needed for shortness of breath.  11/30/19  Yes Biagio Borg, MD  aspirin EC 81 MG EC tablet Take 1 tablet (81 mg total) by mouth daily. 03/27/16  Yes Velvet Bathe, MD  atorvastatin (LIPITOR) 10 MG tablet NEW PRESCRIPTION REQUEST: ATORVASTATIN 10MG  - TAKE ONE TABLET BY MOUTH IN THE EVENING Patient taking differently: Take 10 mg by mouth daily.  11/30/19  Yes Biagio Borg, MD  citalopram (CELEXA) 20 MG tablet NEW PRESCRIPTION REQUEST: CITALOPRAM 20MG  - TAKE ONE TABLET BY MOUTH DAILY Patient taking differently: Take 20 mg by mouth daily.  11/30/19  Yes Biagio Borg, MD  fluticasone  furoate-vilanterol (BREO ELLIPTA) 200-25 MCG/INH AEPB Inhale 1 puff into the lungs daily. 11/30/19  Yes Biagio Borg, MD  furosemide (LASIX) 40 MG tablet NEW PRESCRIPTION REQUEST: FUROSEMIDE 40MG  - TAKE ONE TABLET BY MOUTH IN THE MORNING AND TAKE ONE TABLET BY MOUTH AS NEEDED IN THE EVENING Patient taking differently: Take 40 mg by mouth 2 (two) times daily.  11/30/19  Yes Biagio Borg, MD  levothyroxine (SYNTHROID) 75 MCG tablet NEW PRESCRIPTION REQUEST: LEVOTHYROXINE 75MCG - TAKE ONE TABLET BY MOUTH EARLY MORNING Patient taking differently: Take 75 mcg by mouth daily.  11/30/19  Yes Biagio Borg, MD  OXYGEN Inhale 3 L into the lungs. 24/7   Yes [provider]  potassium chloride (KLOR-CON) 10 MEQ tablet NEW PRESCRIPTION REQUEST: KLOR-CON 10MEQ - TAKE ONE TABLET BY MOUTH TWICE DAILY Patient taking differently: Take 10 mEq by mouth 2 (two) times daily.  11/30/19  Yes Biagio Borg, MD  tiotropium (SPIRIVA HANDIHALER) 18 MCG inhalation capsule NEW PRESCRIPTION REQUEST: SPIRIVA 18MCG [place] OF THE CONTENTS OF ONE CAPSULE BY ONCE DAILY Patient taking differently: Place 18 mcg into inhaler and inhale daily.  11/30/19  Yes Biagio Borg, MD  traMADol (ULTRAM) 50 MG tablet Take 1 tablet (50 mg total) by mouth 2 (two) times daily as needed. Patient taking differently: Take 50 mg by mouth 2 (two) times daily as needed for moderate pain.  11/30/19  Yes Biagio Borg, MD    Physical Exam: Vitals:   12/04/19 1700 12/04/19 1715 12/04/19 1720 12/04/19 1730  BP: (!) 127/55 133/65  133/67  Pulse:    86  Resp: 14 17 17 18   Temp:      TempSrc:      SpO2:   95% 100%    Constitutional: NAD, calm, comfortable Vitals:   12/04/19 1700 12/04/19 1715 12/04/19 1720 12/04/19 1730  BP: (!) 127/55 133/65  133/67  Pulse:    86  Resp: 14 17 17 18   Temp:      TempSrc:      SpO2:   95% 100%   Eyes: PERRL, lids and conjunctivae normal ENMT: Mucous membranes are dry.   Neck:  normal, supple, no JVD Respiratory: Diminished breathing sound, scattered wheezing. Significant increase of respiratory effort and using accessory muscle to help breathing.  Cardiovascular: Regular rate and rhythm, no murmurs / rubs / gallops. No extremity edema. 2+ pedal pulses. No carotid bruits.  Abdomen: no tenderness, no masses palpated. No hepatosplenomegaly. Bowel sounds positive.  Musculoskeletal: no clubbing / cyanosis. No joint deformity upper and lower extremities. Good ROM, no contractures. Normal muscle tone.  Skin: no rashes, lesions, ulcers. No induration Neurologic: Arousable to voice, moving all limbs Psychiatric: Lethargic  Labs on Admission: I have personally reviewed following labs and imaging studies  CBC: Recent Labs  Lab 12/04/19 1212  WBC 5.9  HGB 14.6  HCT 51.3*  MCV 111.5*  PLT [id number]   Basic Metabolic Panel: Recent Labs  Lab 12/04/19 1212  NA 141  K 5.4*  CL 87*  CO2 43*  GLUCOSE 132*  BUN 10  CREATININE 0.74  CALCIUM 9.8   GFR: CrCl cannot be calculated (Unknown ideal weight.). Liver Function Tests: Recent Labs  Lab 12/04/19 1212  AST 29  ALT 19  ALKPHOS 50  BILITOT 1.0  PROT 8.0  ALBUMIN 4.3   No results for input(s): LIPASE, AMYLASE in the last 168 hours. No results for input(s): AMMONIA in the last 168 hours. Coagulation Profile: No results for input(s): INR, PROTIME in the last 168 hours. Cardiac Enzymes: No results for input(s): CKTOTAL, CKMB, CKMBINDEX, TROPONINI in the last 168 hours. BNP (last 3 results) No results for input(s): PROBNP in the last 8760 hours. HbA1C: No results for input(s): HGBA1C in the last 72 hours. CBG: No results for input(s): GLUCAP in the last 168 hours. Lipid Profile: No results for input(s): CHOL, HDL, LDLCALC, TRIG, CHOLHDL, LDLDIRECT in the last 72 hours. Thyroid Function Tests: No results for input(s): TSH, T4TOTAL, FREET4, T3FREE, THYROIDAB in the last 72 hours. Anemia Panel: No results  for input(s): VITAMINB12, FOLATE, FERRITIN, TIBC, IRON, RETICCTPCT in the last 72 hours. Urine analysis:    Component Value Date/Time   COLORURINE YELLOW 12/04/2019 1522   APPEARANCEUR CLEAR 12/04/2019 1522   LABSPEC 1.023 12/04/2019 1522   PHURINE 6.0 12/04/2019 1522   GLUCOSEU NEGATIVE 12/04/2019 1522   GLUCOSEU NEGATIVE 09/20/2018 1426   HGBUR NEGATIVE 12/04/2019 [place] 12/04/2019 1522   BILIRUBINUR negative 08/11/2011 1358   KETONESUR 5 (A) 12/04/2019 1522   PROTEINUR 30 (A) 12/04/2019 1522   UROBILINOGEN 0.2 09/20/2018 1426   NITRITE NEGATIVE 12/04/2019 1522   LEUKOCYTESUR NEGATIVE 12/04/2019 1522    Radiological Exams on Admission: DG Chest Port 1 View  Result Date: 12/04/2019 CLINICAL DATA:  Altered mental status. Increased lethargy and weakness. COPD. EXAM: PORTABLE CHEST 1 VIEW COMPARISON:  Chest radiograph 08/14/2019 FINDINGS: Stable cardiomediastinal contours heart size. Aortic arch calcification. Lungs are hyperinflated. Bandlike opacity in the right mid lung likely represents scarring. Chronic coarsening of the interstitium bilaterally. No new focal infiltrate. No pneumothorax or significant effusion. No acute finding in the visualized skeleton. IMPRESSION: No acute cardiopulmonary abnormality. Stable chronic findings as above. Electronically Signed   By: Audie Pinto M.D.   On: 12/04/2019 12:06    EKG: Independently reviewed.   Assessment/Plan Active Problems:   COPD (chronic obstructive pulmonary disease) (HCC)  Acute hypercapnic respiratory failure Minimal hypoxia in this case From unable to coherent to HS BIPAP Significant improvement of PH on ABG over two hours' BIPAP use, continue to BIPAP as tolerated. Blood work showing metabolic alkalosis, she is on BID lasix which I will stop for she probably has contraction alkalosis now given her chloride level also low. Overall, not quite sure she needs this BID Lasix, Echo (2020 and 2017)  showing only diastolic CHF which can be addressed by other BP control meds rather than Lasix, and she does not have a drastic pulm HTN or right sided failure either and she is not overload either, and metabolic alkalosis likely make her respiratory acidosis worse. Hold Lasix, PRN BP meds and probably switch to other BP regimen on discharge. VBG tonight and  ABG in AM Consider Diamox Albuterol, steroid  Hyperkalemia Stop K supplement.  Acute metabolic encephalopathy From CO2 retention, BIPAP  HLD Statin  Hypothyroid Home meds   DVT prophylaxis: Lovenox Code Status: DNR Family Communication: Spouse Disposition Plan: Home once PCO2 comes down Consults called: None Admission status: Tele admission   Lequita Halt MD [facility] Pager [phone]    12/04/2019, 5:57 PM

## 2019-12-04 NOTE — ED Provider Notes (Signed)
[facility] EMERGENCY DEPARTMENT Provider Note   CSN: [id number] Arrival date & time: 12/04/19  1131     History Chief Complaint  Patient presents with  . Altered Mental Status    JAYDN HAZEN is a 74 y.o. female.  Patient with hx copd, arrives to ED via EMS with generalized weakness, increased lethargic, decreased responsiveness and decreased activity for the past few days. Symptoms acute onset, moderate, persistent, worse today. Patient very limited historian, weakness/altered mental status - level 5 caveat. +non prod cough. No known covid + exposure. No fevers. No chest pain. No leg pain or swelling. +diffuse wheezing, unclear tx's at home. Pt is on home o2 3 liters. ?compliance w home bipap therapy esp in setting recent power outages - hx chronic respiratory failure.   The history is provided by the patient and the EMS personnel. The history is limited by the condition of the patient.  Altered Mental Status Associated symptoms: no fever and no vomiting        Past Medical History:  Diagnosis Date  . ALLERGIC RHINITIS 05/01/2007  . Anxiety   . Arthritis    "hands" (12/28/2015)  . ASTHMA 05/01/2007   "since I was a child"  . Cancer (HCC)    LUNG  . CHF (congestive heart failure) ([place])   . Chronic bronchitis ([place])   . COPD (chronic obstructive pulmonary disease) ([place])   . DEPRESSION 07/21/2008  . DVT (deep venous thrombosis) (HCC) 1960s   LLE  . FATIGUE 10/10/2010  . HYPERTENSION 05/01/2007  . HYPERTHYROIDISM 11/23/2007   Pt endorses having had Graves disease, possibly radioactive iodine x1, but no thyroidectomy  . HYPOTHYROIDISM 08/23/2009  . LUMBAR RADICULOPATHY, RIGHT 08/25/2008  . On home oxygen therapy    "3-4L; qd; all the time" (12/28/2015)  . OSTEOPOROSIS 05/01/2007  . SHOULDER PAIN, LEFT 08/23/2009  . SINUSITIS, CHRONIC 10/10/2010    Patient Active Problem List   Diagnosis Date Noted  . Malnutrition of moderate degree 08/16/2019  . Type II  respiratory failure ([place]) 08/14/2019  . Acute respiratory failure with hypoxia and hypercarbia ([place]) 03/07/2019  . Respiratory acidosis 03/07/2019  . Respiratory failure ([place]) 12/16/2018  . Preventative health care 09/20/2018  . Edema due to congestive heart failure ([place]) 07/27/2018  . Palliative care by specialist   . Shortness of breath   . Chronic low back pain 04/30/2018  . COPD with acute exacerbation ([place])   . Hypothyroidism   . Anxiety   . Respiratory failure with hypercapnia ([place]) 03/24/2018  . Hypercarbia 03/22/2018  . Polysubstance abuse ([place]) 01/26/2018  . Aspiration into airway 06/03/2017  . Rash 07/12/2016  . Bilateral hearing loss 06/05/2016  . Hyperglycemia 05/21/2016  . Abnormal LFTs 03/24/2016  . Elevated blood sugar 03/24/2016  . Solitary pulmonary nodule 01/09/2016  . Chronic respiratory failure with hypoxia ([place])   . On home oxygen therapy   . Chronic diastolic congestive heart failure ([place])   . Bacteremia Step viridans 12/27/2015  . Tobacco abuse disorder 12/25/2015  . Hypothyroidism following radioiodine therapy 09/26/2014  . Protein-calorie malnutrition, severe ([place]) 06/23/2014  . Cor pulmonale ([place]) 01/12/2013  . COPD (chronic obstructive pulmonary disease) ([place]) 11/29/2011  . Macrocytosis without anemia 11/09/2011  . Palliative care encounter 04/09/2011  . SINUSITIS, CHRONIC 10/10/2010  . FATIGUE 10/10/2010  . Depression with anxiety 07/21/2008  . Essential hypertension 05/01/2007  . ALLERGIC RHINITIS 05/01/2007  . Asthma 05/01/2007  . OSTEOPOROSIS 05/01/2007    Past Surgical History:  Procedure  Laterality Date  . APPENDECTOMY    . DILATION AND CURETTAGE OF UTERUS  multiple   history of multiple dialations and curettages and miscarriages, unfortunately never carrying a child to term  . ECTOPIC PREGNANCY SURGERY  "late '60s or early '70s  . ELECTROCARDIOGRAM  06/20/2006  . OOPHORECTOMY Right      OB History    Gravida  5   Para      Term        Preterm      AB  5   Living        SAB  4   TAB      Ectopic  1   Multiple      Live Births              Family History  Problem Relation Age of Onset  . Lung cancer Father   . Alcohol abuse Brother   . Diabetes Brother   . Hypertension Other   . Stroke Mother   . Thyroid disease Mother   . Asthma Other        maternal aunts  . Breast cancer Neg Hx     Social History   Tobacco Use  . Smoking status: Former Smoker    Packs/day: 1.00    Years: 52.00    Pack years: 52.00    Types: Cigarettes    Start date: 1965    Quit date: 07/20/2016    Years since quitting: 3.3  . Smokeless tobacco: Never Used  Substance Use Topics  . Alcohol use: Not Currently  . Drug use: Not Currently    Home Medications Prior to Admission medications   Medication Sig Start Date End Date Taking? Authorizing Provider  albuterol (PROVENTIL) (2.5 MG/3ML) 0.083% nebulizer solution Take 3 mLs (2.5 mg total) by nebulization every 6 (six) hours as needed for wheezing or shortness of breath. 11/30/19   Biagio Borg, MD  albuterol (VENTOLIN HFA) 108 (90 Base) MCG/ACT inhaler INHALE 1 TO 2 PUFFS INTO THE LUNGS EVERY 6 (SIX) HOURS AS NEEDED FOR WHEEZING OR SHORTNESS OF BREATH. 11/30/19   Biagio Borg, MD  aspirin EC 81 MG EC tablet Take 1 tablet (81 mg total) by mouth daily. 03/27/16   Velvet Bathe, MD  atorvastatin (LIPITOR) 10 MG tablet NEW PRESCRIPTION REQUEST: ATORVASTATIN 10MG  - TAKE ONE TABLET BY MOUTH IN THE EVENING 11/30/19   Biagio Borg, MD  citalopram (CELEXA) 20 MG tablet NEW PRESCRIPTION REQUEST: CITALOPRAM 20MG  - TAKE ONE TABLET BY MOUTH DAILY 11/30/19   Biagio Borg, MD  fluticasone furoate-vilanterol (BREO ELLIPTA) 200-25 MCG/INH AEPB Inhale 1 puff into the lungs daily. 11/30/19   Biagio Borg, MD  furosemide (LASIX) 40 MG tablet NEW PRESCRIPTION REQUEST: FUROSEMIDE 40MG  - TAKE ONE TABLET BY MOUTH IN THE MORNING AND TAKE ONE TABLET BY MOUTH AS NEEDED IN THE EVENING 11/30/19    Biagio Borg, MD  levothyroxine (SYNTHROID) 75 MCG tablet NEW PRESCRIPTION REQUEST: LEVOTHYROXINE 75MCG - TAKE ONE TABLET BY MOUTH EARLY MORNING 11/30/19   Biagio Borg, MD  OXYGEN Inhale 3 L into the lungs. 24/7    [provider]  potassium chloride (KLOR-CON) 10 MEQ tablet NEW PRESCRIPTION REQUEST: KLOR-CON 10MEQ - TAKE ONE TABLET BY MOUTH TWICE DAILY 11/30/19   Biagio Borg, MD  tiotropium (SPIRIVA HANDIHALER) 18 MCG inhalation capsule NEW PRESCRIPTION REQUEST: SPIRIVA 18MCG HANDIHALER - TAKE TWO INHALATIONS OF THE CONTENTS OF ONE CAPSULE BY ONCE DAILY 11/30/19  Biagio Borg, MD  traMADol (ULTRAM) 50 MG tablet Take 1 tablet (50 mg total) by mouth 2 (two) times daily as needed. 11/30/19   Biagio Borg, MD    Allergies    Alendronate sodium  Review of Systems   Review of Systems  Unable to perform ROS: Mental status change  Constitutional: Negative for fever.  Respiratory: Positive for cough, shortness of breath and wheezing.   Cardiovascular: Negative for chest pain.  Gastrointestinal: Negative for vomiting.  level 5 caveat - altered mental status/confusion    Physical Exam Updated Vital Signs BP 126/64 (BP Location: Left Arm)   Pulse 66   Temp 97.6 F (36.4 C) (Oral)   Resp (!) 25   SpO2 100%   Physical Exam Vitals and nursing note reviewed.  Constitutional:      Appearance: Normal appearance. She is well-developed.     Comments: Weak appearing, audibly wheezing.   HENT:     Head: Atraumatic.     Nose: Nose normal.     Mouth/Throat:     Mouth: Mucous membranes are moist.  Eyes:     General: No scleral icterus.    Conjunctiva/sclera: Conjunctivae normal.     Pupils: Pupils are equal, round, and reactive to light.  Neck:     Trachea: No tracheal deviation.     Comments: No stiffness or rigidity Cardiovascular:     Rate and Rhythm: Normal rate and regular rhythm.     Pulses: Normal pulses.     Heart sounds: Normal heart sounds. No murmur. No friction  rub. No gallop.   Pulmonary:     Effort: Respiratory distress present.     Breath sounds: Wheezing present.     Comments: Diffuse wheezing with diminished air exchange/faint bs bil.  Abdominal:     General: Bowel sounds are normal. There is no distension.     Palpations: Abdomen is soft.     Tenderness: There is no abdominal tenderness. There is no guarding.  Genitourinary:    Comments: No cva tenderness.  Musculoskeletal:        General: No swelling or tenderness.     Cervical back: Normal range of motion and neck supple. No rigidity. No muscular tenderness.  Skin:    General: Skin is warm and dry.     Findings: No rash.  Neurological:     Mental Status: She is alert.     Comments: Patient lethargic appearing, is arousable, and will respond to some questions with yes/no or very brief response.  Pt moves bil extremities purposefully.  Psychiatric:        Mood and Affect: Mood normal.     ED Results / Procedures / Treatments   Labs (all labs ordered are listed, but only abnormal results are displayed) Results for orders placed or performed during the hospital encounter of 12/04/19  Respiratory Panel by RT PCR (Flu A&B, Covid) - Nasopharyngeal Swab   Specimen: Nasopharyngeal Swab  Result Value Ref Range   SARS Coronavirus 2 by RT PCR NEGATIVE NEGATIVE   Influenza A by PCR NEGATIVE NEGATIVE   Influenza B by PCR NEGATIVE NEGATIVE  CBC  Result Value Ref Range   WBC 5.9 4.0 - 10.5 K/uL   RBC 4.60 3.87 - 5.11 MIL/uL   Hemoglobin 14.6 12.0 - 15.0 g/dL   HCT 51.3 (H) 36.0 - 46.0 %   MCV 111.5 (H) 80.0 - 100.0 fL   MCH 31.7 26.0 - 34.0 pg   MCHC 28.5 (L)  30.0 - 36.0 g/dL   RDW 13.1 11.5 - 15.5 %   Platelets 174 150 - 400 K/uL   nRBC 0.0 0.0 - 0.2 %  Comprehensive metabolic panel  Result Value Ref Range   Sodium 141 135 - 145 mmol/L   Potassium 5.4 (H) 3.5 - 5.1 mmol/L   Chloride 87 (L) 98 - 111 mmol/L   CO2 43 (H) 22 - 32 mmol/L   Glucose, Bld 132 (H) 70 - 99 mg/dL   BUN  10 8 - 23 mg/dL   Creatinine, Ser 0.74 0.44 - 1.00 mg/dL   Calcium 9.8 8.9 - 10.3 mg/dL   Total Protein 8.0 6.5 - 8.1 g/dL   Albumin 4.3 3.5 - 5.0 g/dL   AST 29 15 - 41 U/L   ALT 19 0 - 44 U/L   Alkaline Phosphatase 50 38 - 126 U/L   Total Bilirubin 1.0 0.3 - 1.2 mg/dL   GFR calc non Af Amer >60 >60 mL/min   GFR calc Af Amer >60 >60 mL/min   Anion gap 11 5 - 15  I-Stat beta hCG blood, ED (MC, WL, AP only)  Result Value Ref Range   I-stat hCG, quantitative <5.0 <5 mIU/mL   Comment 3           DG Chest Port 1 View  Result Date: 12/04/2019 CLINICAL DATA:  Altered mental status. Increased lethargy and weakness. COPD. EXAM: PORTABLE CHEST 1 VIEW COMPARISON:  Chest radiograph 08/14/2019 FINDINGS: Stable cardiomediastinal contours heart size. Aortic arch calcification. Lungs are hyperinflated. Bandlike opacity in the right mid lung likely represents scarring. Chronic coarsening of the interstitium bilaterally. No new focal infiltrate. No pneumothorax or significant effusion. No acute finding in the visualized skeleton. IMPRESSION: No acute cardiopulmonary abnormality. Stable chronic findings as above. Electronically Signed   By: Audie Pinto M.D.   On: 12/04/2019 12:06   MM 3D SCREEN BREAST BILATERAL  Result Date: 11/15/2019 CLINICAL DATA:  Screening. EXAM: DIGITAL SCREENING BILATERAL MAMMOGRAM WITH TOMO AND CAD COMPARISON:  Previous exam(s). ACR Breast Density Category c: The breast tissue is heterogeneously dense, which may obscure small masses. FINDINGS: There are no findings suspicious for malignancy. Images were processed with CAD. IMPRESSION: No mammographic evidence of malignancy. A result letter of this screening mammogram will be mailed directly to the patient. RECOMMENDATION: Screening mammogram in one year. (Code:SM-B-01Y) BI-RADS CATEGORY  1: Negative. Electronically Signed   By: Lajean Manes M.D.   On: 11/15/2019 13:33    EKG EKG Interpretation  Date/Time:  Sunday December 04 2019 11:32:43 EST Ventricular Rate:  82 PR Interval:    QRS Duration: 87 QT Interval:  377 QTC Calculation: 441 R Axis:   91 Text Interpretation: Sinus rhythm RAE, consider biatrial enlargement Right axis deviation No significant change since last tracing Confirmed by Lajean Saver [phone]) on 12/04/2019 11:35:31 AM   Radiology DG Chest Port 1 View  Result Date: 12/04/2019 CLINICAL DATA:  Altered mental status. Increased lethargy and weakness. COPD. EXAM: PORTABLE CHEST 1 VIEW COMPARISON:  Chest radiograph 08/14/2019 FINDINGS: Stable cardiomediastinal contours heart size. Aortic arch calcification. Lungs are hyperinflated. Bandlike opacity in the right mid lung likely represents scarring. Chronic coarsening of the interstitium bilaterally. No new focal infiltrate. No pneumothorax or significant effusion. No acute finding in the visualized skeleton. IMPRESSION: No acute cardiopulmonary abnormality. Stable chronic findings as above. Electronically Signed   By: Audie Pinto M.D.   On: 12/04/2019 12:06    Procedures Procedures (including critical care time)  Medications Ordered in ED Medications  albuterol (VENTOLIN HFA) 108 (90 Base) MCG/ACT inhaler 4 puff (has no administration in time range)  ipratropium (ATROVENT HFA) inhaler 2 puff (has no administration in time range)  albuterol (VENTOLIN HFA) 108 (90 Base) MCG/ACT inhaler 4 puff (4 puffs Inhalation Given 12/04/19 1152)  ipratropium (ATROVENT HFA) inhaler 2 puff (2 puffs Inhalation Given 12/04/19 1155)  methylPREDNISolone sodium succinate (SOLU-MEDROL) 125 mg/2 mL injection 80 mg (80 mg Intravenous Given 12/04/19 1256)  AeroChamber Plus Flo-Vu Large MISC (1 each  Given 12/04/19 1244)    ED Course  I have reviewed the triage vital signs and the nursing notes.  Pertinent labs & imaging results that were available during my care of the patient were reviewed by me and considered in my medical decision making (see chart for details).      MDM Rules/Calculators/A&P                      Iv ns. Continuous pulse ox and monitor. o2 via [place]. Stat labs and imaging.  Albuterol and atrovent breathing treatments. Solumedrol iv.   Reviewed nursing notes and prior charts for additional history. Last admission reviewed, acute on chr resp failure  - DNI then. Will try to neb txs and non invasive ventilation. Prior admit May 2020, acute on chr resp failure then, pco2 > 100 - responded to bipap/nebs - d/c note references importance of home meds, smoking cessation, and home bipap.   Patient with non prod cough, sob. covid test sent.   SHAIANN HEINISCH was evaluated in Emergency Department on 12/04/2019 for the symptoms described in the history of present illness. She was evaluated in the context of the global COVID-19 pandemic, which necessitated consideration that the patient might be at risk for infection with the SARS-CoV-2 virus that causes COVID-19. Institutional protocols and algorithms that pertain to the evaluation of patients at risk for COVID-19 are in a state of rapid change based on information released by regulatory bodies including the CDC and federal and state organizations. These policies and algorithms were followed during the patient's care in the ED.  Reviewed nursing notes and prior charts for additional history.   Initial labs reviewed/interpreted by me - chem with markedly elevateed hco3 - elevated in past and on chronic basis but more so today - will add abg to workup.   CXR reviewed/interpreted by me - no pna.   Additional labs reviewed/interpreted by me - covid negative. abg remains pending.   Persistent wheezing, diffuse. Additional albuterol and atrovent breathing treatments.   ABG w resp acidosis, acute on chronic resp failure. Resp therapy consulted - bipap on minimal o2, keep sats 86-90%, w albuterol/atrovent nebs.   Medicine team consulted for admission.  Recheck pt, w bipap, nebs, pts mental status improved  from prior. Wheezing persists, air exchange improved from prior. Will plan repeat abg 2 hrs post instituting bipap therapy, will continue to follow mental status.   Additional respiratory status/mental status checks - sl improved air exchange, ms although altered appears slowly improving from prior.   Discussed pt with hospitalist - will admit, stepdown.  CRITICAL CARE RE: acute on chronic respiratory failure with hypoxia and hypercarbia, altered mental status, continuous neb and bipap therapy.  Performed by: Mirna Mires Total critical care time: 115 minutes Critical care time was exclusive of separately billable procedures and treating other patients. Critical care was necessary to treat or prevent imminent or life-threatening deterioration. Critical care was time  spent personally by me on the following activities: development of treatment plan with patient and/or surrogate as well as nursing, discussions with consultants, evaluation of patient's response to treatment, examination of patient, obtaining history from patient or surrogate, ordering and performing treatments and interventions, ordering and review of laboratory studies, ordering and review of radiographic studies, pulse oximetry and re-evaluation of patient's condition.     Final Clinical Impression(s) / ED Diagnoses Final diagnoses:  None    Rx / DC Orders ED Discharge Orders    None       Lajean Saver, MD 12/04/19 1511

## 2019-12-04 NOTE — ED Notes (Signed)
Pt placed on BiPAP by RT

## 2019-12-04 NOTE — Progress Notes (Signed)
ABG reults abnormal. Above reportable range. Ph 7.16 Co2 greater than 120 O2 93 Bicarb 49.2 . Notified Lajean Saver, MD of ABG results. Orders to start BIPAP.

## 2019-12-04 NOTE — ED Triage Notes (Signed)
Pt here from home for AMS since Friday. Pt has had increased lethargy and weakness, has not been out of bed or eating. At baseline pt wears 3L O2 at home for hx of COPD. VSS

## 2019-12-05 LAB — BASIC METABOLIC PANEL
Anion gap: 11 (ref 5–15)
BUN: 12 mg/dL (ref 8–23)
CO2: 41 mmol/L — ABNORMAL HIGH (ref 22–32)
Calcium: 9.7 mg/dL (ref 8.9–10.3)
Chloride: 92 mmol/L — ABNORMAL LOW (ref 98–111)
Creatinine, Ser: 0.74 mg/dL (ref 0.44–1.00)
GFR calc Af Amer: >60 mL/min (ref >60)
GFR calc non Af Amer: >60 mL/min (ref >60)
Glucose, Bld: 110 mg/dL — ABNORMAL HIGH (ref 70–99)
Potassium: 4.2 mmol/L (ref 3.5–5.1)
Sodium: 144 mmol/L (ref 135–145)

## 2019-12-05 LAB — CBC
HCT: 40.4 % (ref 36.0–46.0)
Hemoglobin: 11.9 g/dL — ABNORMAL LOW (ref 12.0–15.0)
MCH: 31.2 pg (ref 26.0–34.0)
MCHC: 29.5 g/dL — ABNORMAL LOW (ref 30.0–36.0)
MCV: 106 fL — ABNORMAL HIGH (ref 80.0–100.0)
Platelets: 157 10*3/uL (ref 150–400)
RBC: 3.81 MIL/uL — ABNORMAL LOW (ref 3.87–5.11)
RDW: 13 % (ref 11.5–15.5)
WBC: 6.5 10*3/uL (ref 4.0–10.5)
nRBC: 0 % (ref 0.0–0.2)

## 2019-12-05 LAB — BLOOD GAS, ARTERIAL
Acid-Base Excess: 20.7 mmol/L — ABNORMAL HIGH (ref 0.0–2.0)
Bicarbonate: 47.2 mmol/L — ABNORMAL HIGH (ref 20.0–28.0)
Drawn by: 54887
FIO2: 35
O2 Saturation: 95.4 %
Patient temperature: 37
pCO2 arterial: 82.6 mmHg (ref 32.0–48.0)
pH, Arterial: 7.375 (ref 7.350–7.450)
pO2, Arterial: 77.2 mmHg — ABNORMAL LOW (ref 83.0–108.0)

## 2019-12-05 MED ORDER — ENSURE ENLIVE PO LIQD
237.0000 mL | Freq: Three times a day (TID) | ORAL | Status: DC
  Administered 2019-12-05 – 2019-12-07 (×6): 237 mL via ORAL

## 2019-12-05 MED ORDER — IPRATROPIUM-ALBUTEROL 0.5-2.5 (3) MG/3ML IN SOLN
3.0000 mL | Freq: Two times a day (BID) | RESPIRATORY_TRACT | Status: DC
  Administered 2019-12-05 – 2019-12-07 (×4): 3 mL via RESPIRATORY_TRACT
  Filled 2019-12-05 (×4): qty 3

## 2019-12-05 MED ORDER — IPRATROPIUM-ALBUTEROL 0.5-2.5 (3) MG/3ML IN SOLN
3.0000 mL | Freq: Three times a day (TID) | RESPIRATORY_TRACT | Status: DC
  Administered 2019-12-05: 15:00:00 3 mL via RESPIRATORY_TRACT
  Filled 2019-12-05: qty 3

## 2019-12-05 MED ORDER — ADULT MULTIVITAMIN W/MINERALS CH
1.0000 | ORAL_TABLET | Freq: Every day | ORAL | Status: DC
  Administered 2019-12-05 – 2019-12-07 (×3): 1 via ORAL
  Filled 2019-12-05 (×3): qty 1

## 2019-12-05 NOTE — Consult Note (Signed)
   [facility] Inpatient Consult   12/05/2019  Jasmine [name] 1946-09-13 [id number]   Patient is showing up as active with [facility] for chronic disease management services.  Patient is assigned to a [place] with [facility] insurance noted.  Chart reviewed of [facility] notes and found that maintaining contact with patient has been ongoing.  Review of medical record includes but not limited to per History and Physical of admitting MD 12/04/19 as follows:  Jasmine [name] is a 74 y.o. female with medical history significant of Oxygen dependent, baseline 3 L and BIPAP HS, has not been able to use the BIPAP for last two nights because of the power outage. Started to very sleepy and SOB this morning. She was recently just seen by her PCP 3 days ago and was doing ok at that point. Pt very sleepy, most of the Hx was provided by her spouse over the phone. She did have some dry cough and wheezing since last night, but no fevers.  Plan: Follow up with patient to get appropriate contact information when appropriate was working with therapy at this time and with [place].  Plan:  Will follow therapy notes  recommendations for disposition and needs.  Will follow up with Inpatient Transition Of Care [TOC] team member to make aware that [facility] following.   Of note, [facility] Care Management services does not replace or interfere with any services that are needed or arranged by inpatient [facility] care management team.  For additional questions or referrals please contact:  Natividad Brood, RN BSN [facility] Liaison  [phone] business mobile phone Toll free office [phone]  Fax number: [phone] [email or web address]

## 2019-12-05 NOTE — Progress Notes (Signed)
RT NOTE:  ABG sample collected @ 0518. Sample sent to lab via tube station @ [facility]. This RT called prior to sending sample.

## 2019-12-05 NOTE — Progress Notes (Signed)
Admission history could not be performed due to patients level of confusion.

## 2019-12-05 NOTE — Progress Notes (Signed)
ABG sample collected and was sent to Lab. Lab was called and notified.

## 2019-12-05 NOTE — Progress Notes (Signed)
Initial Nutrition Assessment  DOCUMENTATION CODES:   Not applicable  INTERVENTION:   Ensure Enlive po TID, each supplement provides 350 kcal and 20 grams of protein  MVI with minerals daily    NUTRITION DIAGNOSIS:   Inadequate oral intake related to poor appetite as evidenced by per patient/family report.    GOAL:   Patient will meet greater than or equal to 90% of their needs    MONITOR:   PO intake, Supplement acceptance, Skin, Weight trends, Labs  REASON FOR ASSESSMENT:   Consult Assessment of nutrition requirement/status  ASSESSMENT:   Pt with a PMH significant for HTN, DVT, CHF, hypothyroidism, lung ca, chronic bronchitis, COPD, oxygen dependence (baseline 3L) and BIPAP HS. Pt has not been able to use the BIPAP for last two nights because of the power outage. Pt presented with exhaustion and shortness of breath.  Discussed pt with RN.  Pt reports a good appetite today, but states her appetite has been intermittently poor for several weeks. She states that she typically eats 2-3 meals per day, depending on how late she sleeps. For breakfast/brunch, she will typically have eggs, bacon, toast, and grits. For lunch/dinner, she has things like stirfry, hamburgers, baked beans. Pt reports having 1-2 Ensures per day.   No PO intake documented. Breakfast meal tray observed, pt consumed 100%.   Pt noted to have 6% wt loss x3 months, which is not significant for time frame. Pt endorses this wt loss; reports a UBW of ~116 lbs.   Medications reviewed and include: deltasone Labs reviewed.  UOP: 481ml x24 hours I/O: -139.1ml since admit  NUTRITION - FOCUSED PHYSICAL EXAM:    Most Recent Value  Orbital Region  No depletion  Upper Arm Region  Moderate depletion  Thoracic and Lumbar Region  Mild depletion  Buccal Region  Mild depletion  Temple Region  Moderate depletion  Clavicle Bone Region  Moderate depletion  Clavicle and Acromion Bone Region  Moderate depletion   Scapular Bone Region  Mild depletion  Dorsal Hand  Mild depletion  Patellar Region  Mild depletion  Anterior Thigh Region  Mild depletion  Posterior Calf Region  Mild depletion  Edema (RD Assessment)  Mild  Hair  Reviewed  Eyes  Reviewed  Mouth  Other (Comment) [cracked lips]  Skin  Reviewed  Nails  Reviewed       Diet Order:   Diet Order            Diet Heart Room service appropriate? Yes; Fluid consistency: Thin  Diet effective now              EDUCATION NEEDS:   Education needs have been addressed  Skin:  Skin Assessment: Reviewed RN Assessment  Last BM:  2/14  Height:   Ht Readings from Last 1 Encounters:  12/04/19 5' (1.524 m)    Weight:   Wt Readings from Last 1 Encounters:  12/05/19 50.9 kg    BMI:  Body mass index is 21.92 kg/m.  Estimated Nutritional Needs:   Kcal:  1300-1500  Protein:  65-80 grams  Fluid:  >1.3L/d    Larkin Ina, MS, RD, LDN RD pager number and weekend/on-call pager number located in [place].
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
## 2019-12-05 NOTE — Progress Notes (Signed)
PT was taken off of BIPAP and placed on 4 L [place] by bedside RN.
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
## 2019-12-05 NOTE — Evaluation (Signed)
Physical Therapy Evaluation Patient Details Name: Jasmine [name] MRN: [id number] DOB: Mar 04, 1946 Today's Date: 12/05/2019   History of Present Illness  74yo female dependent on 3LPM O2 who was unable to use her home Bipap for 2 nights due to power outage, now sleepy and SOB. Admitted due to acute respiratory failure with hypoxia and hypercarbia. PMH lumbar radiculopathy, HTN, hx DVT, lung CA, COPD, CHF  Clinical Impression   Patient received in bed, very pleasant and willing to mobilize with PT. Able to correctly answer all A&O questions, but very confused functionally- perseverated on her lines and IV, getting rescue inhaler from RN, and required heavy VC for safety and sequencing, clearly with STM deficit today. See below for mobility/assist. Moves quite well but does require almost constant cuing and S-min guard for safety when OOB. SpO2 98-100% on 3LPM per [place]. Did seem to fatigue quickly today. She was left up in the chair with alarm active and OT present/attending, all other needs met this morning. Feel she can return home as long as family is able to provide appropriate levels of S/assist.     Follow Up Recommendations Home health PT;Supervision/Assistance - 24 hour    Equipment Recommendations  Other (comment)(well equipped)    Recommendations for Other Services       Precautions / Restrictions Precautions Precautions: Fall;Other (comment) Precaution Comments: impulsive, STM deficits, watch sats Restrictions Weight Bearing Restrictions: No      Mobility  Bed Mobility Overal bed mobility: Independent             General bed mobility comments: increased time/effort  Transfers Overall transfer level: Needs assistance Equipment used: None Transfers: Sit to/from Stand Sit to Stand: Supervision         General transfer comment: S for safety and line management, often impulsively sitting and standing during session  Ambulation/Gait Ambulation/Gait assistance: Min  guard Gait Distance (Feet): 27 Feet(54f, then 117f Assistive device: None Gait Pattern/deviations: Step-through pattern;Trunk flexed;Decreased step length - right;Decreased step length - left;Drifts right/left;Narrow base of support Gait velocity: decreased   General Gait Details: needed min guard and assist for lines, as well as VC for safety with ambulation in the room; Spo2 98-100% on 3LPM per [place], just impulsive and unsafe with her mobility if left to her own devices  Stairs            Wheelchair Mobility    Modified Rankin (Stroke Patients Only)       Balance Overall balance assessment: Needs assistance Sitting-balance support: Feet supported Sitting balance-Leahy Scale: Good     Standing balance support: During functional activity Standing balance-Leahy Scale: Fair Standing balance comment: reaching for furniture and external support                             Pertinent Vitals/Pain Pain Assessment: Faces Faces Pain Scale: Hurts a little bit Pain Location: back Pain Descriptors / Indicators: Discomfort Pain Intervention(s): Limited activity within patient's tolerance;Monitored during session    Home Living Family/patient expects to be discharged to:: Private residence Living Arrangements: Spouse/significant other Available Help at Discharge: Family;Available 24 hours/day Type of Home: House Home Access: Ramped entrance     Home Layout: One level Home Equipment: Walker - 4 wheels;Walker - 2 wheels;Shower seat Additional Comments: 3L home O2    Prior Function Level of Independence: Independent         Comments: ADLs, IADLs, driving     Hand Dominance  Dominant Hand: Right    Extremity/Trunk Assessment   Upper Extremity Assessment Upper Extremity Assessment: Defer to OT evaluation    Lower Extremity Assessment Lower Extremity Assessment: Overall WFL for tasks assessed    Cervical / Trunk Assessment Cervical / Trunk  Assessment: Kyphotic  Communication   Communication: No difficulties  Cognition Arousal/Alertness: Awake/alert Behavior During Therapy: Impulsive;Flat affect Overall Cognitive Status: Impaired/Different from baseline Area of Impairment: Orientation;Attention;Memory;Following commands;Safety/judgement;Awareness;Problem solving                 Orientation Level: Person;Place;Time;Situation Current Attention Level: Selective Memory: Decreased short-term memory;Decreased recall of precautions Following Commands: Follows one step commands consistently;Follows multi-step commands inconsistently Safety/Judgement: Decreased awareness of safety;Decreased awareness of deficits Awareness: Intellectual Problem Solving: Difficulty sequencing;Requires verbal cues General Comments: able to correctly state all A&O questions, but functionally confused and impulsive. Perseverated on getting her rescue inhaler from RN. Repeated hersefl multiple times during session about inhaler and her breathing. Forgot to pull up underwear when done in bathroom, needed at lot of VC for safety and sequencing      General Comments      Exercises     Assessment/Plan    PT Assessment Patient needs continued PT services  PT Problem List Decreased strength;Decreased cognition;Decreased knowledge of use of DME;Decreased activity tolerance;Decreased safety awareness;Decreased balance;Decreased mobility;Decreased coordination       PT Treatment Interventions DME instruction;Balance training;Gait training;Stair training;Functional mobility training;Patient/family education;Therapeutic activities;Therapeutic exercise    PT Goals (Current goals can be found in the Care Plan section)  Acute Rehab PT Goals Patient Stated Goal: go home, find rescue inhaler PT Goal Formulation: With patient Time For Goal Achievement: 12/19/19 Potential to Achieve Goals: Good    Frequency Min 3X/week   Barriers to discharge         Co-evaluation PT/OT/SLP Co-Evaluation/Treatment: Yes Reason for Co-Treatment: Necessary to address cognition/behavior during functional activity;For patient/therapist safety PT goals addressed during session: Mobility/safety with mobility         AM-PAC PT "6 Clicks" Mobility  Outcome Measure Help needed turning from your back to your side while in a flat bed without using bedrails?: None Help needed moving from lying on your back to sitting on the side of a flat bed without using bedrails?: None Help needed moving to and from a bed to a chair (including a wheelchair)?: A Little Help needed standing up from a chair using your arms (e.g., wheelchair or bedside chair)?: A Little Help needed to walk in hospital room?: A Little Help needed climbing 3-5 steps with a railing? : A Little 6 Click Score: 20    End of Session   Activity Tolerance: Patient tolerated treatment well Patient left: in chair;with call bell/phone within reach;with chair alarm set Nurse Communication: Mobility status PT Visit Diagnosis: Muscle weakness (generalized) (M62.81);Other abnormalities of gait and mobility (R26.89);Difficulty in walking, not elsewhere classified (R26.2)    Time: 3838-1840 PT Time Calculation (min) (ACUTE ONLY): 38 min   Charges:   PT Evaluation $PT Eval Low Complexity: 1 Low(co-eval with OT) PT Treatments $Gait Training: 8-22 mins        Windell Norfolk, DPT, PN1   Supplemental Physical Therapist [place]    Pager [phone] Acute Rehab Office [phone]

## 2019-12-06 ENCOUNTER — Other Ambulatory Visit: Payer: Self-pay

## 2019-12-06 LAB — CBC
HCT: 40.4 % (ref 36.0–46.0)
Hemoglobin: 12.3 g/dL (ref 12.0–15.0)
MCH: 31.4 pg (ref 26.0–34.0)
MCHC: 30.4 g/dL (ref 30.0–36.0)
MCV: 103.1 fL — ABNORMAL HIGH (ref 80.0–100.0)
Platelets: 180 10*3/uL (ref 150–400)
RBC: 3.92 MIL/uL (ref 3.87–5.11)
RDW: 13.5 % (ref 11.5–15.5)
WBC: 7.9 10*3/uL (ref 4.0–10.5)
nRBC: 0 % (ref 0.0–0.2)

## 2019-12-06 LAB — BASIC METABOLIC PANEL
Anion gap: 10 (ref 5–15)
BUN: 17 mg/dL (ref 8–23)
CO2: 39 mmol/L — ABNORMAL HIGH (ref 22–32)
Calcium: 9.8 mg/dL (ref 8.9–10.3)
Chloride: 89 mmol/L — ABNORMAL LOW (ref 98–111)
Creatinine, Ser: 0.61 mg/dL (ref 0.44–1.00)
GFR calc Af Amer: >60 mL/min (ref >60)
GFR calc non Af Amer: >60 mL/min (ref >60)
Glucose, Bld: 71 mg/dL (ref 70–99)
Potassium: 4 mmol/L (ref 3.5–5.1)
Sodium: 138 mmol/L (ref 135–145)

## 2019-12-06 NOTE — Progress Notes (Signed)
Pt output decreased. I have made Sharlet Salina, MD aware and encouraged the patient void as much as possible.

## 2019-12-06 NOTE — Progress Notes (Signed)
Pt's husband at bedside and asked to speak with RN outside of the room. Pt's husband stated she is very non-compliant with wearing BiPAP at home and sometimes refuses to wear it at night.

## 2019-12-06 NOTE — Progress Notes (Signed)
PROGRESS NOTE    Jasmine [name]  RKY:[id number] DOB: 10-Jul-1946 DOA: 12/04/2019 PCP: Biagio Borg, MD   Brief Narrative:  BTD:VVOHYWV D Booneis a 74 y.o.femalewith medical history significant ofOxygen dependent, baseline 3 L and BIPAP HS, has not been able to use the BIPAP for last two nights because of the power outage. Started to very sleepy and SOB this morning.She was recently just seen by her PCP 3 days ago and was doing ok at that point. Pt very sleepy, most of the Hx was provided by her spouse over the phone. She did have some dry cough and wheezing since last night, but nofevers. No chest pain. No leg pain or swelling.  Assessment & Plan:   Principal Problem:   Acute respiratory failure with hypoxia and hypercarbia (HCC) Active Problems:   Essential hypertension   COPD (chronic obstructive pulmonary disease) (HCC)   Tobacco abuse disorder   Chronic diastolic congestive heart failure (HCC)   Hypothyroidism  Acute respiratory failure with hypercarbia: -Bipap QHS only now which she is supposed to use at home -per significant other she has some non-compliance issues due to memory changes which worsens as she does not use BIPAP and has been in hospital several times with same problem -Switch to O2 via nasal canula and want to avoid over oxygenation due to chronic COPD likely has some CO2 retention at baseline -O2 sat with walking to assess for home oxygen need (on 3L chronically) -PT/OT to evaluate -prednisone oral and doing well -duonebs change to BID scheduled as she did well with TID yesterday -home inhalers ordered  Essential hypertension: -BP okay -holding home lasix  COPD: -see above  Chronic diastolic HF:  -volume euvolemic -continue to hold home lasix  Hypothyroidism: -continue home synthroid 75 mcg daily  Tobacco abuse: -encouraged cessation  DVT prophylaxis: heparin [place] Code Status: DNR Family Communication: patient only, spoke with  significant other 12/05/19 Disposition Plan:  . Patient came from:home            . Anticipated d/c place:home . Barriers to d/c OR conditions which need to be met to effect a safe d/c: needs PT evaluation today and continued oxygen for mental status recovery, if stable tomorrow can D/C home with breathing   Consultants:   none  Antimicrobials:  none  Subjective: Feeling okay this morning, slept with BIPAP okay. Admits that she does not always use this at home due to not getting it on right. Her significant other helps to get it on. She feels weak still and not much appetite. Denies chest pains.   Objective: Vitals:   12/06/19 0741 12/06/19 0812 12/06/19 0904 12/06/19 1000  BP:  117/68 110/69   Pulse: 62  82 73  Resp: 17   18  Temp:  98.2 F (36.8 C)    TempSrc:  Oral    SpO2: 100%  98% 100%  Weight:      Height:        Intake/Output Summary (Last 24 hours) at 12/06/2019 1152 Last data filed at 12/06/2019 1123 Gross per 24 hour  Intake 804 ml  Output 550 ml  Net 254 ml   Filed Weights   12/04/19 2031 12/05/19 0517 12/06/19 0407  Weight: 52.3 kg 50.9 kg 56.7 kg    Examination:  General exam: Appears calm and comfortable  Respiratory system: Clear to auscultation. Respiratory effort normal. Cardiovascular system: S1 & S2 heard, RRR.  Gastrointestinal system: Abdomen is nondistended, soft and nontender. No organomegaly or masses  felt. Normal bowel sounds heard. Central nervous system: Alert and oriented to person not day or time.  Some memory lapses stable from prior Extremities: Symmetric 5 x 5 power. Skin: No rashes, lesions or ulcers Psychiatry: Judgement and insight unable to assess  Data Reviewed: I have personally reviewed following labs and imaging studies  CBC: Recent Labs  Lab 12/04/19 1212 12/05/19 0358 12/06/19 0446  WBC 5.9 6.5 7.9  HGB 14.6 11.9* 12.3  HCT 51.3* 40.4 40.4  MCV 111.5* 106.0* 103.1*  PLT 174 157 818   Basic Metabolic  Panel: Recent Labs  Lab 12/04/19 1212 12/05/19 0358 12/06/19 0446  NA 141 144 138  K 5.4* 4.2 4.0  CL 87* 92* 89*  CO2 43* 41* 39*  GLUCOSE 132* 110* 71  BUN _0 CREATININE 0.74 0.74 0.61  CALCIUM 9.8 9.7 9.8   GFR: Estimated Creatinine Clearance: 48 mL/min (by C-G formula based on SCr of 0.61 mg/dL). Liver Function Tests: Recent Labs  Lab 12/04/19 1212  AST 29  ALT 19  ALKPHOS 50  BILITOT 1.0  PROT 8.0  ALBUMIN 4.3   No results for input(s): LIPASE, AMYLASE in the last 168 hours. No results for input(s): AMMONIA in the last 168 hours. Coagulation Profile: No results for input(s): INR, PROTIME in the last 168 hours. Cardiac Enzymes: No results for input(s): CKTOTAL, CKMB, CKMBINDEX, TROPONINI in the last 168 hours. BNP (last 3 results) No results for input(s): PROBNP in the last 8760 hours. HbA1C: No results for input(s): HGBA1C in the last 72 hours. CBG: No results for input(s): GLUCAP in the last 168 hours. Lipid Profile: No results for input(s): CHOL, HDL, LDLCALC, TRIG, CHOLHDL, LDLDIRECT in the last 72 hours. Thyroid Function Tests: No results for input(s): TSH, T4TOTAL, FREET4, T3FREE, THYROIDAB in the last 72 hours. Anemia Panel: No results for input(s): VITAMINB12, FOLATE, FERRITIN, TIBC, IRON, RETICCTPCT in the last 72 hours. Sepsis Labs: No results for input(s): PROCALCITON, LATICACIDVEN in the last 168 hours.  Recent Results (from the past 240 hour(s))  Respiratory Panel by RT PCR (Flu A&B, Covid) - Nasopharyngeal Swab     Status: None   Collection Time: 12/04/19 12:12 PM   Specimen: Nasopharyngeal Swab  Result Value Ref Range Status   SARS Coronavirus 2 by RT PCR NEGATIVE NEGATIVE Final    Comment: (NOTE) SARS-CoV-2 target nucleic acids are NOT DETECTED. The SARS-CoV-2 RNA is generally detectable in upper respiratoy specimens during the acute phase of infection. The lowest concentration of SARS-CoV-2 viral copies this assay can detect  is 131 copies/mL. A negative result does not preclude SARS-Cov-2 infection and should not be used as the sole basis for treatment or other patient management decisions. A negative result may occur with  improper specimen collection/handling, submission of specimen other than nasopharyngeal swab, presence of viral mutation(s) within the areas targeted by this assay, and inadequate number of viral copies (<131 copies/mL). A negative result must be combined with clinical observations, patient history, and epidemiological information. The expected result is Negative. Fact Sheet for Patients:  PinkCheek.be Fact Sheet for Healthcare Providers:  GravelBags.it This test is not yet ap proved or cleared by the [place] FDA and  has been authorized for detection and/or diagnosis of SARS-CoV-2 by FDA under an Emergency Use Authorization (EUA). This EUA will remain  in effect (meaning this test can be used) for the duration of the COVID-19 declaration under Section 564(b)(1) of the Act, 21 U.S.C. section 360bbb-3(b)(1), unless the authorization is  terminated or revoked sooner.    Influenza A by PCR NEGATIVE NEGATIVE Final   Influenza B by PCR NEGATIVE NEGATIVE Final    Comment: (NOTE) The Xpert Xpress SARS-CoV-2/FLU/RSV assay is intended as an aid in  the diagnosis of influenza from Nasopharyngeal swab specimens and  should not be used as a sole basis for treatment. Nasal washings and  aspirates are unacceptable for Xpert Xpress SARS-CoV-2/FLU/RSV  testing. Fact Sheet for Patients: PinkCheek.be Fact Sheet for Healthcare Providers: GravelBags.it This test is not yet approved or cleared by the [place] FDA and  has been authorized for detection and/or diagnosis of SARS-CoV-2 by  FDA under an Emergency Use Authorization (EUA). This EUA will remain  in effect (meaning this  test can be used) for the duration of the  Covid-19 declaration under Section 564(b)(1) of the Act, 21  U.S.C. section 360bbb-3(b)(1), unless the authorization is  terminated or revoked. Performed at [facility], [place] [address] [place], [place] [address]     Radiology Studies: DG Chest Port 1 View  Result Date: 12/04/2019 CLINICAL DATA:  Altered mental status. Increased lethargy and weakness. COPD. EXAM: PORTABLE CHEST 1 VIEW COMPARISON:  Chest radiograph 08/14/2019 FINDINGS: Stable cardiomediastinal contours heart size. Aortic arch calcification. Lungs are hyperinflated. Bandlike opacity in the right mid lung likely represents scarring. Chronic coarsening of the interstitium bilaterally. No new focal infiltrate. No pneumothorax or significant effusion. No acute finding in the visualized skeleton. IMPRESSION: No acute cardiopulmonary abnormality. Stable chronic findings as above. Electronically Signed   By: Audie Pinto M.D.   On: 12/04/2019 12:06   Scheduled Meds: . aspirin EC  81 mg Oral Daily  . atorvastatin  10 mg Oral Daily  . citalopram  20 mg Oral Daily  . feeding supplement (ENSURE ENLIVE)  237 mL Oral TID BM  . fluticasone furoate-vilanterol  1 puff Inhalation Daily  . heparin  5,000 Units Subcutaneous Q12H  . ipratropium-albuterol  3 mL Nebulization BID  . levothyroxine  75 mcg Oral Q0600  . multivitamin with minerals  1 tablet Oral Daily  . predniSONE  40 mg Oral Q breakfast  . traMADol  50 mg Oral Q12H  . umeclidinium bromide  1 puff Inhalation Daily   Continuous Infusions:   LOS: 2 days   Time spent: 25  Hoyt Koch, MD [facility]  To contact the attending provider between 7A-7P or the covering provider during after hours 7P-7A, please log into the web site [email or web address] and access using universal [place] password for that web site. If you do not have the password, please call the hospital operator.  12/06/2019, 11:52 AM

## 2019-12-06 NOTE — Plan of Care (Signed)

## 2019-12-06 NOTE — Progress Notes (Signed)
RT placed patient on BIPAP and adjusted settings per patient's needs. Patient is tolerating well at this time. RT will monitor as needed.

## 2019-12-07 DIAGNOSIS — J441 Chronic obstructive pulmonary disease with (acute) exacerbation: Secondary | ICD-10-CM

## 2019-12-07 DIAGNOSIS — I5032 Chronic diastolic (congestive) heart failure: Secondary | ICD-10-CM

## 2019-12-07 DIAGNOSIS — J9602 Acute respiratory failure with hypercapnia: Secondary | ICD-10-CM

## 2019-12-07 DIAGNOSIS — F419 Anxiety disorder, unspecified: Secondary | ICD-10-CM

## 2019-12-07 DIAGNOSIS — J9601 Acute respiratory failure with hypoxia: Secondary | ICD-10-CM

## 2019-12-07 DIAGNOSIS — I10 Essential (primary) hypertension: Secondary | ICD-10-CM

## 2019-12-07 DIAGNOSIS — F329 Major depressive disorder, single episode, unspecified: Secondary | ICD-10-CM

## 2019-12-07 DIAGNOSIS — E039 Hypothyroidism, unspecified: Secondary | ICD-10-CM

## 2019-12-07 DIAGNOSIS — Z720 Tobacco use: Secondary | ICD-10-CM

## 2019-12-07 MED ORDER — PREDNISONE 20 MG PO TABS: 40.0000 mg | ORAL_TABLET | Freq: Every day | ORAL | 0 refills | Status: DC

## 2019-12-07 MED FILL — predniSONE 20 MG TABS: 20 | 2 days supply | Qty: 4 | Fill #0

## 2019-12-07 NOTE — Progress Notes (Signed)
Physical Therapy Treatment Patient Details Name: Jasmine [name] MRN: [id number] DOB: 04-15-1946 Today's Date: 12/07/2019    History of Present Illness 74yo female dependent on 3LPM O2 who was unable to use her home Bipap for 2 nights due to power outage, now sleepy and SOB. Admitted due to acute respiratory failure with hypoxia and hypercarbia. PMH lumbar radiculopathy, HTN, hx DVT, lung CA, COPD, CHF    PT Comments    Patient received in bed, very pleasant and motivated to walk with therapy. See below for mobility/assist levels. Continues to be slightly impulsive and require cues for safety and navigation- went to bathroom after walking and needed total cues to pull her underwear back up prior to ambulating. She was very popular this morning and had 5-6 staff members come into the room, which did seem to be challenging for her as she needed cues to resume what we were doing prior to a new person coming in. Progressed gait distance in hallway, had poor quality pleth but did become slightly SOB and with sat drop to 87 on 3LPM per [place]- when we were back in the room she clarified that she actually uses 4LPM for walking and 3LPM at rest, which is consistent with her sats today. She was left up in the chair with all needs met, chair alarm active and RN present. Possibly going home today.    Follow Up Recommendations  Home health PT;Supervision/Assistance - 24 hour     Equipment Recommendations  Other (comment)(well equipped)    Recommendations for Other Services       Precautions / Restrictions Precautions Precautions: Fall;Other (comment) Precaution Comments: impulsive, STM deficits, watch sats Restrictions Weight Bearing Restrictions: No    Mobility  Bed Mobility Overal bed mobility: Independent             General bed mobility comments: increased time/effort  Transfers Overall transfer level: Needs assistance Equipment used: None Transfers: Sit to/from Stand Sit to Stand:  Supervision         General transfer comment: S for safety and line management, cues for safety, remains slightly impulsive  Ambulation/Gait Ambulation/Gait assistance: Min guard Gait Distance (Feet): 100 Feet Assistive device: None Gait Pattern/deviations: Step-through pattern;Trunk flexed;Decreased step length - right;Decreased step length - left;Drifts right/left;Narrow base of support Gait velocity: decreased   General Gait Details: min gurad for safety and balance, assist for lines; VC for navigation and safety. SpO2 100% at rest on 3LPM, had poor pleth but dropped to 80s with gait on 3LPOM   Stairs             Wheelchair Mobility    Modified Rankin (Stroke Patients Only)       Balance Overall balance assessment: Needs assistance Sitting-balance support: Feet supported Sitting balance-Leahy Scale: Good     Standing balance support: During functional activity Standing balance-Leahy Scale: Fair Standing balance comment: able to maintain balance with min guard                            Cognition Arousal/Alertness: Awake/alert Behavior During Therapy: Impulsive Overall Cognitive Status: Impaired/Different from baseline Area of Impairment: Orientation;Attention;Memory;Following commands;Safety/judgement;Awareness;Problem solving                 Orientation Level: Person;Place;Time;Situation Current Attention Level: Selective Memory: Decreased short-term memory;Decreased recall of precautions Following Commands: Follows one step commands consistently;Follows multi-step commands inconsistently Safety/Judgement: Decreased awareness of safety;Decreased awareness of deficits Awareness: Emergent Problem Solving: Difficulty sequencing;Requires  verbal cues General Comments: able to realize that cords need to be organized and she needs her walker prior to ambulating today- continues to have apparent STM deficits and did need cues for sequencing and  safety      Exercises      General Comments General comments (skin integrity, edema, etc.): needs cues for safety and sequencing, needed reminder to pull underwear up when coming out of bathroom      Pertinent Vitals/Pain Pain Assessment: Faces Pain Score: 0-No pain Faces Pain Scale: No hurt Pain Intervention(s): Limited activity within patient's tolerance;Monitored during session    Home Living                      Prior Function            PT Goals (current goals can now be found in the care plan section) Acute Rehab PT Goals Patient Stated Goal: go home, find rescue inhaler PT Goal Formulation: With patient Time For Goal Achievement: 12/19/19 Potential to Achieve Goals: Good Progress towards PT goals: Progressing toward goals    Frequency    Min 3X/week      PT Plan Current plan remains appropriate    Co-evaluation              AM-PAC PT "6 Clicks" Mobility   Outcome Measure  Help needed turning from your back to your side while in a flat bed without using bedrails?: None Help needed moving from lying on your back to sitting on the side of a flat bed without using bedrails?: None Help needed moving to and from a bed to a chair (including a wheelchair)?: A Little Help needed standing up from a chair using your arms (e.g., wheelchair or bedside chair)?: A Little Help needed to walk in hospital room?: A Little Help needed climbing 3-5 steps with a railing? : A Little 6 Click Score: 20    End of Session Equipment Utilized During Treatment: Gait belt Activity Tolerance: Patient tolerated treatment well Patient left: in chair;with call bell/phone within reach;with chair alarm set;with nursing/sitter in room Nurse Communication: Mobility status;Other (comment)(sats with ambulation) PT Visit Diagnosis: Muscle weakness (generalized) (M62.81);Other abnormalities of gait and mobility (R26.89);Difficulty in walking, not elsewhere classified (R26.2)      Time: 0920-1003 PT Time Calculation (min) (ACUTE ONLY): 43 min  Charges:  $Gait Training: 8-22 mins $Therapeutic Activity: 23-37 mins                     Windell Norfolk, DPT, PN1   Supplemental Physical Therapist [place]    Pager [phone] Acute Rehab Office [phone]

## 2019-12-07 NOTE — Progress Notes (Signed)
Please see in addition to PT note:   SATURATION QUALIFICATIONS: (This note is used to comply with regulatory documentation for home oxygen)  Patient Saturations on Room Air at Rest =  84%, needs 3LPM at rest to maintain in the 90s at baseline   Patient Saturations on Room Air while Ambulating =  DNT- uses 4LPM O2 at baseline for ambulation   Patient Saturations on 2 Liters of oxygen while Ambulating = 87% on 3LPM O2 with gait, needed 4LPM to stay in the 90s with activity   Please briefly explain why patient needs home oxygen:rapid desaturation without supplemental O2  Windell Norfolk, DPT, PN1   Supplemental Physical Therapist [place]    Pager [phone] Acute Rehab Office [phone]

## 2019-12-07 NOTE — TOC Initial Note (Addendum)
Transition of Care [facility]) - Initial/Assessment Note    Patient Details  Name: Jasmine [name] MRN: [id number] Date of Birth: [birth year]  Transition of Care [facility]) CM/SW Contact:    Curlene Labrum, RN Phone Number: 12/07/2019, 12:20 PM  Clinical Narrative:     12/07/2019 - 1220 - Patient presented to [facility] with diagnosis of Acute Respiratory Failure with hypoxia.  The patient lives at home with her husband with Home O2 and Bipap.  Patient had recently been treated with [facility] PT that ended recently.  Patient presented with Medicare Choice for Home Health list and patient interesting in continuing services with [facility] for PT.  Called and spoke with Tommi Rumps with [facility] and patient started again with their service for home health PT in the home.  Patient states she has all needed DME in her home and does not need additional.  Patient  Sees [name] for primary care and follow up as scheduled for post hospital stay.  TOC pharmacy delivered medications to the room while I was present and nursing at the bedside. Patient waiting on husband to return with portable O2 equipment for ride and discharge home. Patient ready for discharge and no additional case management support needed at this time.               Expected Discharge Plan: [place] Barriers to Discharge: No Barriers Identified   Patient Goals and CMS Choice Patient states their goals for this hospitalization and ongoing recovery are:: "I'm ready to go home with my husband." CMS Medicare.gov Compare Post Acute Care list provided to:: Patient Choice offered to / list presented to : Patient  Expected Discharge Plan and Services Expected Discharge Plan: [place]   Discharge Planning Services: CM Consult Post Acute Care Choice: [place] arrangements for the past 2 months: Single Family Home Expected Discharge Date: 12/07/19               DME Arranged: N/A DME Agency:  (confirmed with Tommi Rumps)       [place] Arranged: PT HH Agency: [place] Date [facility] Agency Contacted: 12/07/19 Time HH Agency Contacted: 10 Representative spoke with at [place]: Meredeth Ide  Prior Living Arrangements/Services Living arrangements for the past 2 months: [place] Lives with:: Spouse Patient language and need for interpreter reviewed:: Yes Do you feel safe going back to the place where you live?: Yes      Need for Family Participation in Patient Care: Yes (Comment) Care giver support system in place?: Yes (comment) Current home services: DME, Other (comment)(Patient currently has existing home O2 / Bipap) Criminal Activity/Legal Involvement Pertinent to Current Situation/Hospitalization: No - Comment as needed  Activities of Daily Living Home Assistive Devices/Equipment: None ADL Screening (condition at time of admission) Patient's cognitive ability adequate to safely complete daily activities?: Yes Is the patient deaf or have difficulty hearing?: No Does the patient have difficulty seeing, even when wearing glasses/contacts?: No Does the patient have difficulty concentrating, remembering, or making decisions?: Yes Patient able to express need for assistance with ADLs?: Yes Does the patient have difficulty dressing or bathing?: Yes Independently performs ADLs?: Yes (appropriate for developmental age) Does the patient have difficulty walking or climbing stairs?: Yes Weakness of Legs: None Weakness of Arms/Hands: None  Permission Sought/Granted Permission sought to share information with : Case Manager Permission granted to share information with : Yes, Verbal Permission Granted  Permission granted to share info w AGENCY: [facility]  Permission granted to share info w Relationship: Husband  Permission granted to share info w Contact Information: Husband  Emotional Assessment Appearance:: Appears stated age,  Well-Groomed Attitude/Demeanor/Rapport: Engaged, Gracious Affect (typically observed): Accepting, Appropriate Orientation: : Oriented to Self, Oriented to Place, Oriented to  Time, Oriented to Situation Alcohol / Substance Use: Not Applicable Psych Involvement: No (comment)  Admission diagnosis:  COPD (chronic obstructive pulmonary disease) (HCC) [J44.9] COPD exacerbation (HCC) [J44.1] Acute on chronic respiratory failure with hypoxia and hypercapnia (HCC) [id number], J96.22] Patient Active Problem List   Diagnosis Date Noted  . Malnutrition of moderate degree 08/16/2019  . Type II respiratory failure ([place]) 08/14/2019  . Acute respiratory failure with hypoxia and hypercarbia ([place]) 03/07/2019  . Respiratory acidosis 03/07/2019  . Respiratory failure ([place]) 12/16/2018  . Preventative health care 09/20/2018  . Edema due to congestive heart failure ([place]) 07/27/2018  . Palliative care by specialist   . Shortness of breath   . Chronic low back pain 04/30/2018  . COPD with acute exacerbation ([place])   . Hypothyroidism   . Anxiety   . Respiratory failure with hypercapnia ([place]) 03/24/2018  . Hypercarbia 03/22/2018  . Polysubstance abuse ([place]) 01/26/2018  . Aspiration into airway 06/03/2017  . Rash 07/12/2016  . Bilateral hearing loss 06/05/2016  . Hyperglycemia 05/21/2016  . Abnormal LFTs 03/24/2016  . Elevated blood sugar 03/24/2016  . Solitary pulmonary nodule 01/09/2016  . Chronic respiratory failure with hypoxia ([place])   . On home oxygen therapy   . Chronic diastolic congestive heart failure ([place])   . Bacteremia Step viridans 12/27/2015  . Tobacco abuse disorder 12/25/2015  . Hypothyroidism following radioiodine therapy 09/26/2014  . Protein-calorie malnutrition, severe ([place]) 06/23/2014  . Cor pulmonale ([place]) 01/12/2013  . COPD (chronic obstructive pulmonary disease) ([place]) 11/29/2011  . Macrocytosis without anemia 11/09/2011  . Palliative care encounter 04/09/2011  . SINUSITIS,  CHRONIC 10/10/2010  . FATIGUE 10/10/2010  . Depression with anxiety 07/21/2008  . Essential hypertension 05/01/2007  . ALLERGIC RHINITIS 05/01/2007  . Asthma 05/01/2007  . OSTEOPOROSIS 05/01/2007   PCP:  Biagio Borg, MD Pharmacy:   [facility], [place] [address] Phone: [phone] Fax: [phone]  [place], [place] - [address] [place] [address] [place] [address] Phone: [phone] Fax: [place], [place] [address] [place] [address] Phone: [phone] Fax: [phone]     Social Determinants of Health (SDOH) Interventions    Readmission Risk Interventions Readmission Risk Prevention Plan 12/07/2019 08/17/2019 03/10/2019  Transportation Screening Complete Complete Complete  PCP or Specialist Appt within 3-5 Days Complete - -  HRI or Home Care Consult Complete - -  Social Work Consult for [place] Planning/Counseling Complete - -  Palliative Care Screening Not Applicable - -  Medication Review ([place]) Complete - Complete  PCP or Specialist appointment within 3-5 days of discharge - - Complete  HRI or [place] - - Complete  SW Recovery Care/Counseling Consult - - Complete  [place] - - Not Applicable  Some recent data might be hidden

## 2019-12-07 NOTE — Progress Notes (Signed)
Pt currently off BiPAP with saturations of 100% on 3L [place] (home regimen). Pt states her breathing is great at this time. RT will continue to monitor.
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
## 2019-12-08 ENCOUNTER — Other Ambulatory Visit: Payer: Self-pay

## 2019-12-08 NOTE — Patient Outreach (Signed)
[place] [facility]) Care Management  12/08/2019  Jasmine [name] 08/17/1946 [id number]   Telephone call to patient.  No answer to her phone and unable to leave a message.  Telephone call to ex-husband Jasmine [name] who lives with patient.  No answer to his phone.  HIPAA compliant voice message left to his phone.  Plan: RN CM will send letter and attempt again within 4 business days.  [name], RN, MSN [place] Management Care Management Coordinator Direct Line [phone] Cell [phone] Toll Free: [phone]  Fax: [phone]

## 2019-12-09 ENCOUNTER — Telehealth: Payer: Self-pay

## 2019-12-09 NOTE — Telephone Encounter (Signed)
Transition Care Management Follow-up Telephone Call   Date discharged? 12/07/2019  How have you been since you were released from the hospital? Feeling much better  Do you understand why you were in the hospital? Yes  Do you understand the discharge instructions? Yes  Where were you discharged to? Home  Items Reviewed:  Medications reviewed: Yes  Allergies reviewed: Yes  Dietary changes reviewed: Yes  Referrals reviewed: Yes  Functional Questionnaire:   Activities of Daily Living (ADLs):   States they are independent in the following: All ADL's States they require assistance with the following: N/A  Any transportation issues/concerns?: No  Any patient concerns? NOT at this time  Confirmed importance and date/time of follow-up visits scheduled: YES  Provider Appointment booked with PCP  Confirmed with patient if condition begins to worsen call PCP or go to the ER: YES Patient was given the office number and encouraged to call back with question or concerns: YES

## 2019-12-12 ENCOUNTER — Other Ambulatory Visit: Payer: Self-pay

## 2019-12-12 ENCOUNTER — Other Ambulatory Visit: Payer: Self-pay | Admitting: Internal Medicine

## 2019-12-12 NOTE — Telephone Encounter (Signed)
Please refill as per office routine med refill policy (all routine meds refilled for 3 mo or monthly per pt preference up to one year from last visit, then month to month grace period for 3 mo, then further med refills will have to be denied)  

## 2019-12-12 NOTE — Patient Outreach (Signed)
[place] [facility]) Care Management  12/12/2019  Jasmine [name] [birth year] [id number]   Telephone call to patient for follow up assessment. No answer.  HIPAA compliant voice message left.    Plan: RN CM will attempt patient again within 4 business days.    [name], RN, MSN [place] Management Care Management Coordinator Direct Line [phone] Cell [phone] Toll Free: [phone]  Fax: [phone]

## 2019-12-13 ENCOUNTER — Other Ambulatory Visit: Payer: Self-pay

## 2019-12-13 NOTE — Patient Outreach (Signed)
[facility]) Care Management  [place]  12/13/2019   Jasmine [name] October 29, 1945 [id number]  Subjective: Telephone call to patient for hospital follow up. She states she is doing good but has been in the hospital due to her not wearing her BiPAP.  She states she tries to remember to wear at night but she falls asleep before putting it on. Discussed methods of remembering to put machine on at night and importance of wearing machine in managing her COPD.  Patient lives in the home with her ex spouse who helps patient a lot so she says.  Patient has follow up with PCP on 12/14/2019 and her husband will transport.  Patient to call [facility] for follow up home health.   Discussed her COPD and symptoms of worsening.  She verbalized understanding and voices no concerns.    Objective:   Encounter Medications:  Outpatient Encounter Medications as of 12/13/2019  Medication Sig  . albuterol (PROVENTIL) (2.5 MG/3ML) 0.083% nebulizer solution Take 3 mLs (2.5 mg total) by nebulization every 6 (six) hours as needed for wheezing or shortness of breath.  [name] albuterol (VENTOLIN HFA) 108 (90 Base) MCG/ACT inhaler INHALE 1 TO 2 PUFFS INTO THE LUNGS EVERY 6 (SIX) HOURS AS NEEDED FOR WHEEZING OR SHORTNESS OF BREATH. (Patient taking differently: Inhale 2 puffs into the lungs every 6 (six) hours as needed for shortness of breath. )  . aspirin EC 81 MG EC tablet Take 1 tablet (81 mg total) by mouth daily.  [name] atorvastatin (LIPITOR) 10 MG tablet NEW PRESCRIPTION REQUEST: ATORVASTATIN 10MG  - TAKE ONE TABLET BY MOUTH IN THE EVENING (Patient taking differently: Take 10 mg by mouth daily. )  . citalopram (CELEXA) 20 MG tablet NEW PRESCRIPTION REQUEST: CITALOPRAM 20MG  - TAKE ONE TABLET BY MOUTH DAILY (Patient taking differently: Take 20 mg by mouth daily. )  . fluticasone furoate-vilanterol (BREO ELLIPTA) 200-25 MCG/INH AEPB Inhale 1 puff into the lungs daily.  . furosemide (LASIX) 40 MG tablet NEW  PRESCRIPTION REQUEST: FUROSEMIDE 40MG  - TAKE ONE TABLET BY MOUTH IN THE MORNING AND TAKE ONE TABLET BY MOUTH AS NEEDED IN THE EVENING (Patient taking differently: Take 40 mg by mouth 2 (two) times daily. )  . levothyroxine (SYNTHROID) 75 MCG tablet NEW PRESCRIPTION REQUEST: LEVOTHYROXINE 75MCG - TAKE ONE TABLET BY MOUTH EARLY MORNING (Patient taking differently: Take 75 mcg by mouth daily. )  . OXYGEN Inhale 3 L into the lungs. 24/7  . potassium chloride (KLOR-CON) 10 MEQ tablet NEW PRESCRIPTION REQUEST: KLOR-CON 10MEQ - TAKE ONE TABLET BY MOUTH TWICE DAILY (Patient taking differently: Take 10 mEq by mouth 2 (two) times daily. )  . predniSONE (DELTASONE) 20 MG tablet Take 2 tablets (40 mg total) by mouth daily with breakfast.  . tiotropium (SPIRIVA HANDIHALER) 18 MCG inhalation capsule NEW PRESCRIPTION REQUEST: SPIRIVA 18MCG HANDIHALER - TAKE TWO INHALATIONS OF THE CONTENTS OF ONE CAPSULE BY ONCE DAILY (Patient taking differently: Place 18 mcg into inhaler and inhale daily. )  . traMADol (ULTRAM) 50 MG tablet Take 1 tablet (50 mg total) by mouth 2 (two) times daily as needed. (Patient taking differently: Take 50 mg by mouth 2 (two) times daily as needed for moderate pain. )   No facility-administered encounter medications on file as of 12/13/2019.    Functional Status:  In your present state of health, do you have any difficulty performing the following activities: 12/13/2019 12/06/2019  Hearing? N N  Vision? N N  Difficulty concentrating or making decisions?  Y Y  Comment forgetful at times -  Walking or climbing stairs? N Y  Comment shortness of breath -  Dressing or bathing? N Y  Doing errands, shopping? N N  Preparing Food and eating ? N -  Using the Toilet? N -  In the past six months, have you accidently leaked urine? N -  Do you have problems with loss of bowel control? N -  Managing your Medications? N -  Managing your Finances? N -  Housekeeping or managing your Housekeeping? Y -   Comment ex husband helps -  Some recent data might be hidden    Fall/Depression Screening: Fall Risk  11/30/2019 08/22/2019 07/29/2019  Falls in the past year? 0 0 0  Number falls in past yr: - - 0  Comment - - -  Injury with Fall? - - -  Risk Factor Category  - - -  Risk for fall due to : - - -  Follow up - - -   PHQ 2/9 Scores 11/30/2019 08/22/2019 07/29/2019 03/28/2019 11/29/2018 11/24/2018 09/20/2018  PHQ - 2 Score 0 0 0 1 3 3  0  PHQ- 9 Score - - - - - 8 -    Assessment: Patient with chronic COPD and recent hospitalization.    Plan:  [facility] CM Care Plan Problem One     Most Recent Value  Care Plan Problem One  Knowledge deficit related to COPD  Role Documenting the Problem One  Care Management Telephonic Coordinator  Care Plan for Problem One  Active  [facility] Long Term Goal   Patient will continue to follow COPD regimen and not have any excerbations within the next 90 days.  THN Long Term Goal Start Date  12/13/19  Interventions for Problem One Long Term Goal  Discussed with patient importance of wearing Bipap at night.  Discussed signs of COPD and notifying physician of changes.    THN CM Short Term Goal #1   Patient will not readmit to the hospital within the next 29 days.  THN CM Short Term Goal #1 Start Date  12/13/19  Interventions for Short Term Goal #1  Discussed with patient current clinical condition since hospital discharge, confirmed that patient has all medications and denies concerns around medications, reviewed post-hospital discharge instructions with patient and confirmed that patient has reliable transportation to all scheduled provider appointments post-hospital discharge.     RN CM will contact patient in the month of March and patient agreeable.   [name], RN, MSN [place] Management Care Management Coordinator Direct Line [phone] Cell [phone] Toll Free: [phone]  Fax: [phone]

## 2019-12-14 ENCOUNTER — Other Ambulatory Visit: Payer: Self-pay

## 2019-12-14 ENCOUNTER — Ambulatory Visit (INDEPENDENT_AMBULATORY_CARE_PROVIDER_SITE_OTHER): Payer: Medicare HMO | Admitting: Internal Medicine

## 2019-12-14 ENCOUNTER — Encounter: Payer: Self-pay | Admitting: Internal Medicine

## 2019-12-14 VITALS — BP 102/54 | HR 85 | Temp 98.9°F | Ht 60.0 in | Wt 115.2 lb

## 2019-12-14 DIAGNOSIS — J432 Centrilobular emphysema: Secondary | ICD-10-CM | POA: Diagnosis not present

## 2019-12-14 DIAGNOSIS — J9611 Chronic respiratory failure with hypoxia: Secondary | ICD-10-CM

## 2019-12-14 DIAGNOSIS — I5032 Chronic diastolic (congestive) heart failure: Secondary | ICD-10-CM | POA: Diagnosis not present

## 2019-12-14 LAB — BASIC METABOLIC PANEL
BUN: 16 mg/dL (ref 6–23)
CO2: 44 mEq/L — ABNORMAL HIGH (ref 19–32)
Calcium: 10 mg/dL (ref 8.4–10.5)
Chloride: 95 mEq/L — ABNORMAL LOW (ref 96–112)
Creatinine, Ser: 0.97 mg/dL (ref 0.40–1.20)
GFR: 67.72 mL/min (ref >60.00)
Glucose, Bld: 85 mg/dL (ref 70–99)
Potassium: 4.8 mEq/L (ref 3.5–5.1)
Sodium: 141 mEq/L (ref 135–145)

## 2019-12-14 LAB — CBC WITH DIFFERENTIAL/PLATELET
Basophils Absolute: 0 10*3/uL (ref 0.0–0.1)
Basophils Relative: 0.9 % (ref 0.0–3.0)
Eosinophils Absolute: 0.2 10*3/uL (ref 0.0–0.7)
Eosinophils Relative: 4.2 % (ref 0.0–5.0)
HCT: 41.8 % (ref 36.0–46.0)
Hemoglobin: 13.5 g/dL (ref 12.0–15.0)
Lymphocytes Relative: 22.8 % (ref 12.0–46.0)
Lymphs Abs: 1.2 10*3/uL (ref 0.7–4.0)
MCHC: 32.2 g/dL (ref 30.0–36.0)
MCV: 98.2 fl (ref 78.0–100.0)
Monocytes Absolute: 1 10*3/uL (ref 0.1–1.0)
Monocytes Relative: 18.2 % — ABNORMAL HIGH (ref 3.0–12.0)
Neutro Abs: 2.9 10*3/uL (ref 1.4–7.7)
Neutrophils Relative %: 53.9 % (ref 43.0–77.0)
Platelets: 206 10*3/uL (ref 150.0–400.0)
RBC: 4.25 Mil/uL (ref 3.87–5.11)
RDW: 14.1 % (ref 11.5–15.5)
WBC: 5.4 10*3/uL (ref 4.0–10.5)

## 2019-12-14 LAB — HEPATIC FUNCTION PANEL
ALT: 20 U/L (ref 0–35)
AST: 26 U/L (ref 0–37)
Albumin: 4.2 g/dL (ref 3.5–5.2)
Alkaline Phosphatase: 51 U/L (ref 39–117)
Bilirubin, Direct: 0.1 mg/dL (ref 0.0–0.3)
Total Bilirubin: 0.7 mg/dL (ref 0.2–1.2)
Total Protein: 7.4 g/dL (ref 6.0–8.3)

## 2019-12-14 LAB — MAGNESIUM: Magnesium: 1.6 mg/dL (ref 1.5–2.5)

## 2019-12-14 NOTE — Patient Instructions (Signed)
Please continue all other medications as before, and refills have been done if requested.  Please have the pharmacy call with any other refills you may need.  Please continue your efforts at being more active, low cholesterol diet, and weight control.  You are otherwise up to date with prevention measures today.  Please keep your appointments with your specialists as you may have planned  Please go to the LAB at the blood drawing area for the tests to be done  You will be contacted by phone if any changes need to be made immediately.  Otherwise, you will receive a letter about your results with an explanation, but please check with MyChart first.  Please remember to sign up for MyChart if you have not done so, as this will be important to you in the future with finding out test results, communicating by private email, and scheduling acute appointments online when needed.  Please make an Appointment to return in 3 months, or sooner if needed 

## 2019-12-14 NOTE — Addendum Note (Signed)
Addended by: Trenda Moots on: [id number] 03:06 PM   Modules accepted: Orders

## 2019-12-14 NOTE — Assessment & Plan Note (Signed)
stable overall by history and exam, recent data reviewed with pt, and pt to continue medical treatment as before,  to f/u any worsening symptoms or concerns  

## 2019-12-14 NOTE — Progress Notes (Signed)
Subjective:    Patient ID: Jasmine [name], female    DOB: 06/01/1946, 74 y.o.   MRN: [id number]  HPI  Here to f/iu post hospn d/c on feb 17 with lower resp exacerbation, back to 4L [place] at her baseline; since d/c Pt denies chest pain, increased sob or doe, wheezing, orthopnea, PND, increased LE swelling, palpitations, dizziness or syncope. No worseing cough, fever, wheezing.  PT at home to get started soon.  Tramadol works prn for recent back pain.  Past Medical History:  Diagnosis Date  . ALLERGIC RHINITIS 05/01/2007  . Anxiety   . Arthritis    "hands" (12/28/2015)  . ASTHMA 05/01/2007   "since I was a child"  . Cancer (HCC)    LUNG  . CHF (congestive heart failure) ([place])   . Chronic bronchitis ([place])   . COPD (chronic obstructive pulmonary disease) ([place])   . DEPRESSION 07/21/2008  . DVT (deep venous thrombosis) (HCC) 1960s   LLE  . FATIGUE 10/10/2010  . HYPERTENSION 05/01/2007  . HYPERTHYROIDISM 11/23/2007   Pt endorses having had Graves disease, possibly radioactive iodine x1, but no thyroidectomy  . HYPOTHYROIDISM 08/23/2009  . LUMBAR RADICULOPATHY, RIGHT 08/25/2008  . On home oxygen therapy    "3-4L; qd; all the time" (12/28/2015)  . OSTEOPOROSIS 05/01/2007  . SHOULDER PAIN, LEFT 08/23/2009  . SINUSITIS, CHRONIC 10/10/2010   Past Surgical History:  Procedure Laterality Date  . APPENDECTOMY    . DILATION AND CURETTAGE OF UTERUS  multiple   history of multiple dialations and curettages and miscarriages, unfortunately never carrying a child to term  . ECTOPIC PREGNANCY SURGERY  "late '60s or early '70s  . ELECTROCARDIOGRAM  06/20/2006  . OOPHORECTOMY Right     reports that she quit smoking about 3 years ago. Her smoking use included cigarettes. She started smoking about 56 years ago. She has a 52.00 pack-year smoking history. She has never used smokeless tobacco. She reports previous alcohol use. She reports previous drug use. family history includes Alcohol abuse in her brother;  Asthma in an other family member; Diabetes in her brother; Hypertension in an other family member; Lung cancer in her father; Stroke in her mother; Thyroid disease in her mother. Allergies  Allergen Reactions  . Alendronate Sodium Other (See Comments)    Patient does not remember this reaction   Current Outpatient Medications on File Prior to Visit  Medication Sig Dispense Refill  . albuterol (PROVENTIL) (2.5 MG/3ML) 0.083% nebulizer solution Take 3 mLs (2.5 mg total) by nebulization every 6 (six) hours as needed for wheezing or shortness of breath. 75 mL 0  . albuterol (VENTOLIN HFA) 108 (90 Base) MCG/ACT inhaler INHALE 1 TO 2 PUFFS INTO THE LUNGS EVERY 6 (SIX) HOURS AS NEEDED FOR WHEEZING OR SHORTNESS OF BREATH. (Patient taking differently: Inhale 2 puffs into the lungs every 6 (six) hours as needed for shortness of breath. ) 18 g 5  . aspirin EC 81 MG EC tablet Take 1 tablet (81 mg total) by mouth daily. 30 tablet 0  . atorvastatin (LIPITOR) 10 MG tablet TAKE ONE TABLET BY MOUTH IN THE EVENING 90 tablet 0  . citalopram (CELEXA) 20 MG tablet NEW PRESCRIPTION REQUEST: CITALOPRAM 20MG  - TAKE ONE TABLET BY MOUTH DAILY (Patient taking differently: Take 20 mg by mouth daily. ) 90 tablet 3  . fluticasone furoate-vilanterol (BREO ELLIPTA) 200-25 MCG/INH AEPB Inhale 1 puff into the lungs daily. 1 each 5  . furosemide (LASIX) 40 MG tablet NEW PRESCRIPTION REQUEST:  FUROSEMIDE 40MG  - TAKE ONE TABLET BY MOUTH IN THE MORNING AND TAKE ONE TABLET BY MOUTH AS NEEDED IN THE EVENING (Patient taking differently: Take 40 mg by mouth 2 (two) times daily. ) 180 tablet 3  . levothyroxine (SYNTHROID) 75 MCG tablet NEW PRESCRIPTION REQUEST: LEVOTHYROXINE 75MCG - TAKE ONE TABLET BY MOUTH EARLY MORNING (Patient taking differently: Take 75 mcg by mouth daily. ) 90 tablet 3  . OXYGEN Inhale 3 L into the lungs. 24/7    . potassium chloride (KLOR-CON) 10 MEQ tablet NEW PRESCRIPTION REQUEST: KLOR-CON 10MEQ - TAKE ONE TABLET BY  MOUTH TWICE DAILY (Patient taking differently: Take 10 mEq by mouth 2 (two) times daily. ) 180 tablet 3  . tiotropium (SPIRIVA HANDIHALER) 18 MCG inhalation capsule NEW PRESCRIPTION REQUEST: SPIRIVA 18MCG HANDIHALER - TAKE TWO INHALATIONS OF THE CONTENTS OF ONE CAPSULE BY ONCE DAILY 90 capsule 3  . traMADol (ULTRAM) 50 MG tablet Take 1 tablet (50 mg total) by mouth 2 (two) times daily as needed. (Patient taking differently: Take 50 mg by mouth 2 (two) times daily as needed for moderate pain. ) 60 tablet 2  . predniSONE (DELTASONE) 20 MG tablet Take 2 tablets (40 mg total) by mouth daily with breakfast. (Patient not taking: Reported on 12/14/2019) 4 tablet 0   No current facility-administered medications on file prior to visit.   Review of Systems All otherwise neg per pt     Objective:   Physical Exam BP (!) 102/54 (BP Location: Left Arm, Patient Position: Sitting, Cuff Size: Normal)   Pulse 85   Temp 98.9 F (37.2 C) (Oral)   Ht 5' (1.524 m)   Wt 115 lb 3.2 oz (52.3 kg)   SpO2 (!) 87% Comment: pt is on 3 L of O2  BMI 22.50 kg/m  VS noted,  Constitutional: Pt appears in NAD HENT: Head: NCAT.  Right Ear: External ear normal.  Left Ear: External ear normal.  Eyes: . Pupils are equal, round, and reactive to light. Conjunctivae and EOM are normal Nose: without d/c or deformity Neck: Neck supple. Gross normal ROM Cardiovascular: Normal rate and regular rhythm.   Pulmonary/Chest: Effort normal and breath sounds decresaed without rales or wheezing.  Abd:  Soft, NT, ND, + BS, no organomegaly Neurological: Pt is alert. At baseline orientation, motor grossly intact Skin: Skin is warm. No rashes, other new lesions, no LE edema Psychiatric: Pt behavior is normal without agitation  All otherwise neg per pt Lab Results  Component Value Date   WBC 7.9 12/06/2019   HGB 12.3 12/06/2019   HCT 40.4 12/06/2019   PLT 180 12/06/2019   GLUCOSE 71 12/06/2019   CHOL 250 (H) 09/20/2018   TRIG 62.0  09/20/2018   HDL 98.60 09/20/2018   LDLDIRECT 102.1 10/10/2010   LDLCALC 139 (H) 09/20/2018   ALT 19 12/04/2019   AST 29 12/04/2019   NA 138 12/06/2019   K 4.0 12/06/2019   CL 89 (L) 12/06/2019   CREATININE 0.61 12/06/2019   BUN 17 12/06/2019   CO2 39 (H) 12/06/2019   TSH 0.272 (L) 03/06/2019   INR 1.58 (H) 03/25/2016   HGBA1C 6.1 09/20/2018      Assessment & Plan:

## 2019-12-15 ENCOUNTER — Encounter: Payer: Self-pay | Admitting: Internal Medicine

## 2019-12-24 ENCOUNTER — Ambulatory Visit: Payer: Medicare HMO | Attending: Internal Medicine

## 2019-12-24 DIAGNOSIS — Z23 Encounter for immunization: Secondary | ICD-10-CM

## 2019-12-24 DIAGNOSIS — J449 Chronic obstructive pulmonary disease, unspecified: Secondary | ICD-10-CM | POA: Diagnosis not present

## 2019-12-24 NOTE — Progress Notes (Signed)
   Covid-19 Vaccination Clinic  Name:  Jasmine [name]    MRN: [id number] DOB: 16-Jul-1946  12/24/2019  [name] was observed post Covid-19 immunization for 15 minutes without incident. She was provided with Vaccine Information Sheet and instruction to access the V-Safe system.   [name] was instructed to call 911 with any severe reactions post vaccine: [name] Difficulty breathing  . Swelling of face and throat  . A fast heartbeat  . A bad rash all over body  . Dizziness and weakness   Immunizations Administered    Name Date Dose VIS Date Route   Pfizer COVID-19 Vaccine 12/24/2019  8:41 AM 0.3 mL 09/30/2019 Intramuscular   Manufacturer: [place]   Lot: [id number]   [place]: [id number]

## 2019-12-27 ENCOUNTER — Other Ambulatory Visit: Payer: Self-pay

## 2019-12-27 NOTE — Patient Outreach (Signed)
[place]) Care Management  [place]  12/27/2019   Jasmine [name] 05-Dec-1945 [id number]  Subjective: Telephone call to patient for follow up. She reports she is doing ok.  She denies any problems with breathing.  She reports she is wearing her BiPAP at night more now.  Discussed with patient importance of BiPAP compliance, COPD symptoms and importance of notifying physician. Patient had first dose of the COVID vaccine and has second dose appointment.  She voices no concerns.   Objective:   Encounter Medications:  Outpatient Encounter Medications as of 12/27/2019  Medication Sig  . albuterol (PROVENTIL) (2.5 MG/3ML) 0.083% nebulizer solution Take 3 mLs (2.5 mg total) by nebulization every 6 (six) hours as needed for wheezing or shortness of breath.  [name] albuterol (VENTOLIN HFA) 108 (90 Base) MCG/ACT inhaler INHALE 1 TO 2 PUFFS INTO THE LUNGS EVERY 6 (SIX) HOURS AS NEEDED FOR WHEEZING OR SHORTNESS OF BREATH. (Patient taking differently: Inhale 2 puffs into the lungs every 6 (six) hours as needed for shortness of breath. )  . aspirin EC 81 MG EC tablet Take 1 tablet (81 mg total) by mouth daily.  [name] atorvastatin (LIPITOR) 10 MG tablet TAKE ONE TABLET BY MOUTH IN THE EVENING  . citalopram (CELEXA) 20 MG tablet NEW PRESCRIPTION REQUEST: CITALOPRAM 20MG  - TAKE ONE TABLET BY MOUTH DAILY (Patient taking differently: Take 20 mg by mouth daily. )  . fluticasone furoate-vilanterol (BREO ELLIPTA) 200-25 MCG/INH AEPB Inhale 1 puff into the lungs daily.  . furosemide (LASIX) 40 MG tablet NEW PRESCRIPTION REQUEST: FUROSEMIDE 40MG  - TAKE ONE TABLET BY MOUTH IN THE MORNING AND TAKE ONE TABLET BY MOUTH AS NEEDED IN THE EVENING (Patient taking differently: Take 40 mg by mouth 2 (two) times daily. )  . levothyroxine (SYNTHROID) 75 MCG tablet NEW PRESCRIPTION REQUEST: LEVOTHYROXINE 75MCG - TAKE ONE TABLET BY MOUTH EARLY MORNING (Patient taking differently: Take 75 mcg by mouth daily. )  .  OXYGEN Inhale 3 L into the lungs. 24/7  . potassium chloride (KLOR-CON) 10 MEQ tablet NEW PRESCRIPTION REQUEST: KLOR-CON 10MEQ - TAKE ONE TABLET BY MOUTH TWICE DAILY (Patient taking differently: Take 10 mEq by mouth 2 (two) times daily. )  . predniSONE (DELTASONE) 20 MG tablet Take 2 tablets (40 mg total) by mouth daily with breakfast. (Patient not taking: Reported on 12/14/2019)  . tiotropium (SPIRIVA HANDIHALER) 18 MCG inhalation capsule NEW PRESCRIPTION REQUEST: SPIRIVA 18MCG HANDIHALER - TAKE TWO INHALATIONS OF THE CONTENTS OF ONE CAPSULE BY ONCE DAILY  . traMADol (ULTRAM) 50 MG tablet Take 1 tablet (50 mg total) by mouth 2 (two) times daily as needed. (Patient taking differently: Take 50 mg by mouth 2 (two) times daily as needed for moderate pain. )   No facility-administered encounter medications on file as of 12/27/2019.    Functional Status:  In your present state of health, do you have any difficulty performing the following activities: 12/13/2019 12/06/2019  Hearing? N N  Vision? N N  Difficulty concentrating or making decisions? Y Y  Comment forgetful at times -  Walking or climbing stairs? N Y  Comment shortness of breath -  Dressing or bathing? N Y  Doing errands, shopping? N N  Preparing Food and eating ? N -  Using the Toilet? N -  In the past six months, have you accidently leaked urine? N -  Do you have problems with loss of bowel control? N -  Managing your Medications? N -  Managing your Finances?  N -  Housekeeping or managing your Housekeeping? Y -  Comment ex husband helps -  Some recent data might be hidden    Fall/Depression Screening: Fall Risk  12/14/2019 11/30/2019 08/22/2019  Falls in the past year? 0 0 0  Number falls in past yr: - - -  Comment - - -  Injury with Fall? - - -  Risk Factor Category  - - -  Risk for fall due to : - - -  Follow up - - -   PHQ 2/9 Scores 12/14/2019 12/13/2019 11/30/2019 08/22/2019 07/29/2019 03/28/2019 11/29/2018  PHQ - 2 Score 0 0 0 0  0 1 3  PHQ- 9 Score - - - - - - -    Assessment: Patient continues to recover from recent hospitalization.   Plan:  [facility] CM Care Plan Problem One     Most Recent Value  Care Plan Problem One  Knowledge deficit related to COPD  Role Documenting the Problem One  Care Management Telephonic Coordinator  Care Plan for Problem One  Active  [facility] Long Term Goal   Patient will continue to follow COPD regimen and not have any excerbations within the next 90 days.  THN Long Term Goal Start Date  12/13/19  Interventions for Problem One Long Term Goal  Reviewed with patient signs of COPD worsening and importance of bipap and notifying physician of changes.    THN CM Short Term Goal #1   Patient will not readmit to the hospital within the next 29 days.  THN CM Short Term Goal #1 Start Date  12/13/19  Interventions for Short Term Goal #1  Reviewed with patient COPD and when to notify physician.       RN CM will contact patient again in the month of March and patient agreeable.  [name], RN, MSN [place] Management Care Management Coordinator Direct Line [phone] Cell [phone] Toll Free: [phone]  Fax: [phone]

## 2020-01-01 DIAGNOSIS — B9561 Methicillin susceptible Staphylococcus aureus infection as the cause of diseases classified elsewhere: Secondary | ICD-10-CM | POA: Diagnosis not present

## 2020-01-01 DIAGNOSIS — R7881 Bacteremia: Secondary | ICD-10-CM | POA: Diagnosis not present

## 2020-01-01 DIAGNOSIS — J449 Chronic obstructive pulmonary disease, unspecified: Secondary | ICD-10-CM | POA: Diagnosis not present

## 2020-01-01 DIAGNOSIS — J9612 Chronic respiratory failure with hypercapnia: Secondary | ICD-10-CM | POA: Diagnosis not present

## 2020-01-04 DIAGNOSIS — J9611 Chronic respiratory failure with hypoxia: Secondary | ICD-10-CM | POA: Diagnosis not present

## 2020-01-04 DIAGNOSIS — R911 Solitary pulmonary nodule: Secondary | ICD-10-CM | POA: Diagnosis not present

## 2020-01-04 DIAGNOSIS — R300 Dysuria: Secondary | ICD-10-CM | POA: Diagnosis not present

## 2020-01-04 DIAGNOSIS — J449 Chronic obstructive pulmonary disease, unspecified: Secondary | ICD-10-CM | POA: Diagnosis not present

## 2020-01-06 ENCOUNTER — Ambulatory Visit: Payer: Self-pay

## 2020-01-10 ENCOUNTER — Ambulatory Visit: Payer: Medicare HMO

## 2020-01-12 ENCOUNTER — Other Ambulatory Visit: Payer: Self-pay

## 2020-01-12 NOTE — Patient Outreach (Signed)
[facility] [facility]) Care Management  01/12/2020  REATHER HALILOVIC 07/03/1946 [id number]   Telephone call to patient for follow up. No answer.  Unable to leave a message.  Plan: RN CM will attempt again within 4 business days and send a letter.  [name], RN, MSN [place] Management Care Management Coordinator Direct Line [phone] Cell [phone] Toll Free: [phone]  Fax: [phone]

## 2020-01-13 ENCOUNTER — Other Ambulatory Visit: Payer: Self-pay

## 2020-01-13 NOTE — Patient Outreach (Signed)
[facility] [facility]) Care Management  01/13/2020  Jasmine [name] 1946-08-15 [id number]   Telephone call to patient for follow up. No answer.  Unable to leave a message.    Plan: RN CM will attempt patient again within 4 business days.    [name], RN, MSN [place] Management Care Management Coordinator Direct Line [phone] Cell [phone] Toll Free: [phone]  Fax: [phone]

## 2020-01-14 ENCOUNTER — Ambulatory Visit: Payer: Medicare HMO | Attending: Internal Medicine

## 2020-01-14 DIAGNOSIS — Z23 Encounter for immunization: Secondary | ICD-10-CM

## 2020-01-14 NOTE — Progress Notes (Signed)
   Covid-19 Vaccination Clinic  Name:  EVELYN STACHOWSKI    MRN: [id number] DOB: Oct 08, 1946  01/14/2020  Ms. Haskew was observed post Covid-19 immunization for 15 minutes without incident. She was provided with Vaccine Information Sheet and instruction to access the V-Safe system.   Ms. Kellar was instructed to call 911 with any severe reactions post vaccine: [name] Difficulty breathing  . Swelling of face and throat  . A fast heartbeat  . A bad rash all over body  . Dizziness and weakness   Immunizations Administered    Name Date Dose VIS Date Route   Pfizer COVID-19 Vaccine 01/14/2020 12:22 PM 0.3 mL 09/30/2019 Intramuscular   Manufacturer: [place]   Lot: [id number]   [place]: [id number]

## 2020-01-18 ENCOUNTER — Other Ambulatory Visit: Payer: Self-pay

## 2020-01-18 NOTE — Patient Outreach (Signed)
[facility]) Care Management  [place]  01/18/2020   Jasmine [name] 1946-09-08 [id number]  Subjective: Telephone call to patient. She reports she is doing good.  She denies any problems with her breathing and wearing her CPAP regularly.  Discussed signs of worsening COPD and when to notify physician. She verbalized understanding.    Objective:   Encounter Medications:  Outpatient Encounter Medications as of 01/18/2020  Medication Sig  . albuterol (PROVENTIL) (2.5 MG/3ML) 0.083% nebulizer solution Take 3 mLs (2.5 mg total) by nebulization every 6 (six) hours as needed for wheezing or shortness of breath.  [name] albuterol (VENTOLIN HFA) 108 (90 Base) MCG/ACT inhaler INHALE 1 TO 2 PUFFS INTO THE LUNGS EVERY 6 (SIX) HOURS AS NEEDED FOR WHEEZING OR SHORTNESS OF BREATH. (Patient taking differently: Inhale 2 puffs into the lungs every 6 (six) hours as needed for shortness of breath. )  . aspirin EC 81 MG EC tablet Take 1 tablet (81 mg total) by mouth daily.  [name] atorvastatin (LIPITOR) 10 MG tablet TAKE ONE TABLET BY MOUTH IN THE EVENING  . citalopram (CELEXA) 20 MG tablet NEW PRESCRIPTION REQUEST: CITALOPRAM 20MG - TAKE ONE TABLET BY MOUTH DAILY (Patient taking differently: Take 20 mg by mouth daily. )  . fluticasone furoate-vilanterol (BREO ELLIPTA) 200-25 MCG/INH AEPB Inhale 1 puff into the lungs daily.  . furosemide (LASIX) 40 MG tablet NEW PRESCRIPTION REQUEST: FUROSEMIDE 40MG - TAKE ONE TABLET BY MOUTH IN THE MORNING AND TAKE ONE TABLET BY MOUTH AS NEEDED IN THE EVENING (Patient taking differently: Take 40 mg by mouth 2 (two) times daily. )  . levothyroxine (SYNTHROID) 75 MCG tablet NEW PRESCRIPTION REQUEST: LEVOTHYROXINE 75MCG - TAKE ONE TABLET BY MOUTH EARLY MORNING (Patient taking differently: Take 75 mcg by mouth daily. )  . OXYGEN Inhale 3 L into the lungs. 24/7  . potassium chloride (KLOR-CON) 10 MEQ tablet NEW PRESCRIPTION REQUEST: KLOR-CON 10MEQ - TAKE ONE TABLET BY  MOUTH TWICE DAILY (Patient taking differently: Take 10 mEq by mouth 2 (two) times daily. )  . predniSONE (DELTASONE) 20 MG tablet Take 2 tablets (40 mg total) by mouth daily with breakfast. (Patient not taking: Reported on 12/14/2019)  . tiotropium (SPIRIVA HANDIHALER) 18 MCG inhalation capsule NEW PRESCRIPTION REQUEST: SPIRIVA 18MCG HANDIHALER - TAKE TWO INHALATIONS OF THE CONTENTS OF ONE CAPSULE BY ONCE DAILY  . traMADol (ULTRAM) 50 MG tablet Take 1 tablet (50 mg total) by mouth 2 (two) times daily as needed. (Patient taking differently: Take 50 mg by mouth 2 (two) times daily as needed for moderate pain. )   No facility-administered encounter medications on file as of 01/18/2020.    Functional Status:  In your present state of health, do you have any difficulty performing the following activities: 12/13/2019 12/06/2019  Hearing? N N  Vision? N N  Difficulty concentrating or making decisions? Y Y  Comment forgetful at times -  Walking or climbing stairs? N Y  Comment shortness of breath -  Dressing or bathing? N Y  Doing errands, shopping? N N  Preparing Food and eating ? N -  Using the Toilet? N -  In the past six months, have you accidently leaked urine? N -  Do you have problems with loss of bowel control? N -  Managing your Medications? N -  Managing your Finances? N -  Housekeeping or managing your Housekeeping? Y -  Comment ex husband helps -  Some recent data might be hidden    Fall/Depression  Screening: Fall Risk  12/14/2019 11/30/2019 08/22/2019  Falls in the past year? 0 0 0  Number falls in past yr: - - -  Comment - - -  Injury with Fall? - - -  Risk Factor Category  - - -  Risk for fall due to : - - -  Follow up - - -   PHQ 2/9 Scores 12/14/2019 12/13/2019 11/30/2019 08/22/2019 07/29/2019 03/28/2019 11/29/2018  PHQ - 2 Score 0 0 0 0 0 1 3  PHQ- 9 Score - - - - - - -    Assessment: Patient managing Chronic COPD.  Plan:  [facility] CM Care Plan Problem One     Most Recent Value   Care Plan Problem One  Knowledge deficit related to COPD  Role Documenting the Problem One  Care Management Telephonic Coordinator  Care Plan for Problem One  Active  [facility] Long Term Goal   Patient will continue to follow COPD regimen and not have any excerbations within the next 90 days.  [facility] Long Term Goal Start Date  12/13/19  Interventions for Problem One Long Term Goal  Patient reports green zone today. Patient wearing her Bi-PAP regularly.  Discussed signs of COPD exacerbation and seeking medical attention,    [facility] CM Short Term Goal #1   Patient will not readmit to the hospital within the next 29 days.  [facility] CM Short Term Goal #1 Start Date  12/13/19  [facility] CM Short Term Goal #1 Met Date  01/18/20     RN CM will contact patient in the month of April and patient agreeable.   [name], RN, MSN [place] Management Care Management Coordinator Direct Line [phone] Cell [phone] Toll Free: [phone]  Fax: [phone]

## 2020-01-19 ENCOUNTER — Telehealth: Payer: Self-pay

## 2020-01-19 NOTE — Telephone Encounter (Signed)
Need to clarify how much oxygen she currently requires to do the rx

## 2020-01-19 NOTE — Telephone Encounter (Signed)
Patient caklling and states that she is having issues with her portable oxygen machine. States that is starting to beep. States that she contacted [facility] and they called the company that the portable is through, and they advised the patient to contact PCP and see about getting a new prescription. Patient states that she has had the machine 4-5 years. Please advise.  CB#: [phone]

## 2020-01-19 NOTE — Telephone Encounter (Signed)
Tried to reach patient today but voicemail is full and I am unable to leave her a message. Will call her back on Monday.

## 2020-01-24 DIAGNOSIS — J449 Chronic obstructive pulmonary disease, unspecified: Secondary | ICD-10-CM | POA: Diagnosis not present

## 2020-01-29 ENCOUNTER — Encounter (HOSPITAL_COMMUNITY): Payer: Self-pay

## 2020-01-29 ENCOUNTER — Other Ambulatory Visit: Payer: Self-pay

## 2020-01-29 ENCOUNTER — Emergency Department (HOSPITAL_COMMUNITY): Payer: Medicare HMO

## 2020-01-29 ENCOUNTER — Inpatient Hospital Stay (HOSPITAL_COMMUNITY)
Admission: EM | Admit: 2020-01-29 | Discharge: 2020-02-03 | DRG: 190 | Disposition: A | Discharge status: Home Health | Payer: Medicare HMO | Attending: Internal Medicine | Admitting: Internal Medicine

## 2020-01-29 DIAGNOSIS — R110 Nausea: Secondary | ICD-10-CM | POA: Diagnosis not present

## 2020-01-29 DIAGNOSIS — M810 Age-related osteoporosis without current pathological fracture: Secondary | ICD-10-CM | POA: Diagnosis present

## 2020-01-29 DIAGNOSIS — G8929 Other chronic pain: Secondary | ICD-10-CM | POA: Diagnosis not present

## 2020-01-29 DIAGNOSIS — Z7189 Other specified counseling: Secondary | ICD-10-CM | POA: Diagnosis not present

## 2020-01-29 DIAGNOSIS — M545 Low back pain: Secondary | ICD-10-CM | POA: Diagnosis present

## 2020-01-29 DIAGNOSIS — R0689 Other abnormalities of breathing: Secondary | ICD-10-CM

## 2020-01-29 DIAGNOSIS — Z8349 Family history of other endocrine, nutritional and metabolic diseases: Secondary | ICD-10-CM

## 2020-01-29 DIAGNOSIS — R0602 Shortness of breath: Secondary | ICD-10-CM | POA: Diagnosis not present

## 2020-01-29 DIAGNOSIS — Z66 Do not resuscitate: Secondary | ICD-10-CM | POA: Diagnosis not present

## 2020-01-29 DIAGNOSIS — R4182 Altered mental status, unspecified: Secondary | ICD-10-CM | POA: Diagnosis not present

## 2020-01-29 DIAGNOSIS — Z7982 Long term (current) use of aspirin: Secondary | ICD-10-CM

## 2020-01-29 DIAGNOSIS — T507X5A Adverse effect of analeptics and opioid receptor antagonists, initial encounter: Secondary | ICD-10-CM | POA: Diagnosis not present

## 2020-01-29 DIAGNOSIS — Z823 Family history of stroke: Secondary | ICD-10-CM | POA: Diagnosis not present

## 2020-01-29 DIAGNOSIS — Z87891 Personal history of nicotine dependence: Secondary | ICD-10-CM | POA: Diagnosis not present

## 2020-01-29 DIAGNOSIS — J9621 Acute and chronic respiratory failure with hypoxia: Secondary | ICD-10-CM | POA: Diagnosis present

## 2020-01-29 DIAGNOSIS — Z9981 Dependence on supplemental oxygen: Secondary | ICD-10-CM

## 2020-01-29 DIAGNOSIS — I5032 Chronic diastolic (congestive) heart failure: Secondary | ICD-10-CM | POA: Diagnosis not present

## 2020-01-29 DIAGNOSIS — T380X5A Adverse effect of glucocorticoids and synthetic analogues, initial encounter: Secondary | ICD-10-CM | POA: Diagnosis present

## 2020-01-29 DIAGNOSIS — Z833 Family history of diabetes mellitus: Secondary | ICD-10-CM

## 2020-01-29 DIAGNOSIS — Z825 Family history of asthma and other chronic lower respiratory diseases: Secondary | ICD-10-CM | POA: Diagnosis not present

## 2020-01-29 DIAGNOSIS — Z888 Allergy status to other drugs, medicaments and biological substances status: Secondary | ICD-10-CM

## 2020-01-29 DIAGNOSIS — F329 Major depressive disorder, single episode, unspecified: Secondary | ICD-10-CM | POA: Diagnosis present

## 2020-01-29 DIAGNOSIS — J9622 Acute and chronic respiratory failure with hypercapnia: Secondary | ICD-10-CM | POA: Diagnosis present

## 2020-01-29 DIAGNOSIS — Z20822 Contact with and (suspected) exposure to covid-19: Secondary | ICD-10-CM | POA: Diagnosis present

## 2020-01-29 DIAGNOSIS — Z8249 Family history of ischemic heart disease and other diseases of the circulatory system: Secondary | ICD-10-CM

## 2020-01-29 DIAGNOSIS — Z515 Encounter for palliative care: Secondary | ICD-10-CM | POA: Diagnosis not present

## 2020-01-29 DIAGNOSIS — T50905A Adverse effect of unspecified drugs, medicaments and biological substances, initial encounter: Secondary | ICD-10-CM | POA: Diagnosis not present

## 2020-01-29 DIAGNOSIS — Z85118 Personal history of other malignant neoplasm of bronchus and lung: Secondary | ICD-10-CM

## 2020-01-29 DIAGNOSIS — Z79899 Other long term (current) drug therapy: Secondary | ICD-10-CM

## 2020-01-29 DIAGNOSIS — Z7951 Long term (current) use of inhaled steroids: Secondary | ICD-10-CM

## 2020-01-29 DIAGNOSIS — Z720 Tobacco use: Secondary | ICD-10-CM | POA: Diagnosis present

## 2020-01-29 DIAGNOSIS — I10 Essential (primary) hypertension: Secondary | ICD-10-CM | POA: Diagnosis not present

## 2020-01-29 DIAGNOSIS — Z801 Family history of malignant neoplasm of trachea, bronchus and lung: Secondary | ICD-10-CM | POA: Diagnosis not present

## 2020-01-29 DIAGNOSIS — Z9119 Patient's noncompliance with other medical treatment and regimen: Secondary | ICD-10-CM

## 2020-01-29 DIAGNOSIS — E890 Postprocedural hypothyroidism: Secondary | ICD-10-CM | POA: Diagnosis present

## 2020-01-29 DIAGNOSIS — E039 Hypothyroidism, unspecified: Secondary | ICD-10-CM | POA: Diagnosis not present

## 2020-01-29 DIAGNOSIS — J449 Chronic obstructive pulmonary disease, unspecified: Secondary | ICD-10-CM | POA: Diagnosis not present

## 2020-01-29 DIAGNOSIS — R739 Hyperglycemia, unspecified: Secondary | ICD-10-CM | POA: Diagnosis present

## 2020-01-29 DIAGNOSIS — R069 Unspecified abnormalities of breathing: Secondary | ICD-10-CM | POA: Diagnosis not present

## 2020-01-29 DIAGNOSIS — R7881 Bacteremia: Secondary | ICD-10-CM | POA: Diagnosis not present

## 2020-01-29 DIAGNOSIS — J441 Chronic obstructive pulmonary disease with (acute) exacerbation: Secondary | ICD-10-CM | POA: Diagnosis not present

## 2020-01-29 DIAGNOSIS — R0902 Hypoxemia: Secondary | ICD-10-CM | POA: Diagnosis not present

## 2020-01-29 DIAGNOSIS — R531 Weakness: Secondary | ICD-10-CM | POA: Diagnosis not present

## 2020-01-29 DIAGNOSIS — Z79890 Hormone replacement therapy (postmenopausal): Secondary | ICD-10-CM

## 2020-01-29 DIAGNOSIS — B9561 Methicillin susceptible Staphylococcus aureus infection as the cause of diseases classified elsewhere: Secondary | ICD-10-CM | POA: Diagnosis not present

## 2020-01-29 DIAGNOSIS — I110 Hypertensive heart disease with heart failure: Secondary | ICD-10-CM | POA: Diagnosis present

## 2020-01-29 DIAGNOSIS — J9612 Chronic respiratory failure with hypercapnia: Secondary | ICD-10-CM | POA: Diagnosis not present

## 2020-01-29 DIAGNOSIS — I700 Atherosclerosis of aorta: Secondary | ICD-10-CM | POA: Diagnosis not present

## 2020-01-29 DIAGNOSIS — E785 Hyperlipidemia, unspecified: Secondary | ICD-10-CM | POA: Diagnosis not present

## 2020-01-29 LAB — BLOOD GAS, ARTERIAL
Acid-Base Excess: 15.7 mmol/L — ABNORMAL HIGH (ref 0.0–2.0)
Bicarbonate: 43.7 mmol/L — ABNORMAL HIGH (ref 20.0–28.0)
FIO2: 28
O2 Saturation: 93.2 %
Patient temperature: 36.6
pCO2 arterial: 107 mmHg (ref 32.0–48.0)
pH, Arterial: 7.233 — ABNORMAL LOW (ref 7.350–7.450)
pO2, Arterial: 74.4 mmHg — ABNORMAL LOW (ref 83.0–108.0)

## 2020-01-29 LAB — URINALYSIS, ROUTINE W REFLEX MICROSCOPIC
Bacteria, UA: NONE SEEN
Bilirubin Urine: NEGATIVE
Glucose, UA: NEGATIVE mg/dL
Hgb urine dipstick: NEGATIVE
Ketones, ur: 5 mg/dL — AB
Leukocytes,Ua: NEGATIVE
Nitrite: NEGATIVE
Protein, ur: 30 mg/dL — AB
Specific Gravity, Urine: 1.025 (ref 1.005–1.030)
pH: 5 (ref 5.0–8.0)

## 2020-01-29 LAB — I-STAT CHEM 8, ED
BUN: 18 mg/dL (ref 8–23)
Calcium, Ion: 1.19 mmol/L (ref 1.15–1.40)
Chloride: 91 mmol/L — ABNORMAL LOW (ref 98–111)
Creatinine, Ser: 0.7 mg/dL (ref 0.44–1.00)
Glucose, Bld: 152 mg/dL — ABNORMAL HIGH (ref 70–99)
HCT: 42 % (ref 36.0–46.0)
Hemoglobin: 14.3 g/dL (ref 12.0–15.0)
Potassium: 5.3 mmol/L — ABNORMAL HIGH (ref 3.5–5.1)
Sodium: 136 mmol/L (ref 135–145)
TCO2: 45 mmol/L — ABNORMAL HIGH (ref 22–32)

## 2020-01-29 LAB — RAPID URINE DRUG SCREEN, HOSP PERFORMED
Amphetamines: NOT DETECTED
Barbiturates: NOT DETECTED
Benzodiazepines: NOT DETECTED
Cocaine: NOT DETECTED
Opiates: NOT DETECTED
Tetrahydrocannabinol: NOT DETECTED

## 2020-01-29 LAB — CBC WITH DIFFERENTIAL/PLATELET
Abs Immature Granulocytes: 0.02 10*3/uL (ref 0.00–0.07)
Basophils Absolute: 0.1 10*3/uL (ref 0.0–0.1)
Basophils Relative: 1 %
Eosinophils Absolute: 0 10*3/uL (ref 0.0–0.5)
Eosinophils Relative: 1 %
HCT: 42.8 % (ref 36.0–46.0)
Hemoglobin: 12.5 g/dL (ref 12.0–15.0)
Immature Granulocytes: 0 %
Lymphocytes Relative: 13 %
Lymphs Abs: 0.8 10*3/uL (ref 0.7–4.0)
MCH: 31.1 pg (ref 26.0–34.0)
MCHC: 29.2 g/dL — ABNORMAL LOW (ref 30.0–36.0)
MCV: 106.5 fL — ABNORMAL HIGH (ref 80.0–100.0)
Monocytes Absolute: 0.4 10*3/uL (ref 0.1–1.0)
Monocytes Relative: 7 %
Neutro Abs: 5 10*3/uL (ref 1.7–7.7)
Neutrophils Relative %: 78 %
Platelets: 159 10*3/uL (ref 150–400)
RBC: 4.02 MIL/uL (ref 3.87–5.11)
RDW: 14 % (ref 11.5–15.5)
WBC: 6.4 10*3/uL (ref 4.0–10.5)
nRBC: 0 % (ref 0.0–0.2)

## 2020-01-29 LAB — HEPATIC FUNCTION PANEL
ALT: 17 U/L (ref 0–44)
AST: 25 U/L (ref 15–41)
Albumin: 3.9 g/dL (ref 3.5–5.0)
Alkaline Phosphatase: 46 U/L (ref 38–126)
Bilirubin, Direct: <0.1 mg/dL (ref 0.0–0.2)
Total Bilirubin: 0.6 mg/dL (ref 0.3–1.2)
Total Protein: 7.2 g/dL (ref 6.5–8.1)

## 2020-01-29 LAB — POCT I-STAT 7, (LYTES, BLD GAS, ICA,H+H)
Acid-Base Excess: 17 mmol/L — ABNORMAL HIGH (ref 0.0–2.0)
Bicarbonate: 46.9 mmol/L — ABNORMAL HIGH (ref 20.0–28.0)
Calcium, Ion: 1.3 mmol/L (ref 1.15–1.40)
HCT: 41 % (ref 36.0–46.0)
Hemoglobin: 13.9 g/dL (ref 12.0–15.0)
O2 Saturation: 92 %
Patient temperature: 98.6
Potassium: 4.4 mmol/L (ref 3.5–5.1)
Sodium: 139 mmol/L (ref 135–145)
TCO2: 49 mmol/L — ABNORMAL HIGH (ref 22–32)
pCO2 arterial: 85.7 mmHg (ref 32.0–48.0)
pH, Arterial: 7.346 — ABNORMAL LOW (ref 7.350–7.450)
pO2, Arterial: 72 mmHg — ABNORMAL LOW (ref 83.0–108.0)

## 2020-01-29 LAB — RESPIRATORY PANEL BY RT PCR (FLU A&B, COVID)
Influenza A by PCR: NEGATIVE
Influenza B by PCR: NEGATIVE
SARS Coronavirus 2 by RT PCR: NEGATIVE

## 2020-01-29 LAB — SALICYLATE LEVEL: Salicylate Lvl: <7 mg/dL — ABNORMAL LOW (ref 7.0–30.0)

## 2020-01-29 LAB — ETHANOL: Alcohol, Ethyl (B): <10 mg/dL (ref <10)

## 2020-01-29 LAB — TROPONIN I (HIGH SENSITIVITY)
Troponin I (High Sensitivity): 6 ng/L (ref <18)
Troponin I (High Sensitivity): 7 ng/L (ref <18)

## 2020-01-29 LAB — TSH: TSH: 0.181 u[IU]/mL — ABNORMAL LOW (ref 0.350–4.500)

## 2020-01-29 LAB — ACETAMINOPHEN LEVEL: Acetaminophen (Tylenol), Serum: <10 ug/mL — ABNORMAL LOW (ref 10–30)

## 2020-01-29 LAB — MRSA PCR SCREENING: MRSA by PCR: NEGATIVE

## 2020-01-29 MED ORDER — ASPIRIN EC 81 MG PO TBEC
81.0000 mg | DELAYED_RELEASE_TABLET | Freq: Every day | ORAL | Status: DC
  Administered 2020-01-30 – 2020-02-03 (×5): 81 mg via ORAL
  Filled 2020-01-29 (×7): qty 1

## 2020-01-29 MED ORDER — NALOXONE HCL 4 MG/10ML IJ SOLN
1.0000 mg/h | INTRAVENOUS | Status: DC
  Filled 2020-01-29: qty 10

## 2020-01-29 MED ORDER — ALBUTEROL SULFATE (2.5 MG/3ML) 0.083% IN NEBU
2.5000 mg | INHALATION_SOLUTION | RESPIRATORY_TRACT | Status: DC | PRN
  Administered 2020-01-29: 2.5 mg via RESPIRATORY_TRACT
  Filled 2020-01-29: qty 3

## 2020-01-29 MED ORDER — ZOLPIDEM TARTRATE 5 MG PO TABS: 5.0000 mg | ORAL_TABLET | Freq: Every evening | ORAL | Status: DC | PRN

## 2020-01-29 MED ORDER — ACETAMINOPHEN 650 MG RE SUPP: 650.0000 mg | Freq: Four times a day (QID) | RECTAL | Status: DC | PRN

## 2020-01-29 MED ORDER — DOCUSATE SODIUM 100 MG PO CAPS
100.0000 mg | ORAL_CAPSULE | Freq: Two times a day (BID) | ORAL | Status: DC
  Administered 2020-01-29 – 2020-02-02 (×7): 100 mg via ORAL
  Filled 2020-01-29 (×8): qty 1

## 2020-01-29 MED ORDER — ONDANSETRON HCL 4 MG PO TABS: 4.0000 mg | ORAL_TABLET | Freq: Four times a day (QID) | ORAL | Status: DC | PRN

## 2020-01-29 MED ORDER — ATORVASTATIN CALCIUM 10 MG PO TABS
10.0000 mg | ORAL_TABLET | Freq: Every day | ORAL | Status: DC
  Administered 2020-01-30 – 2020-02-02 (×4): 10 mg via ORAL
  Filled 2020-01-29 (×4): qty 1

## 2020-01-29 MED ORDER — ONDANSETRON HCL 4 MG/2ML IJ SOLN: 4.0000 mg | Freq: Four times a day (QID) | INTRAMUSCULAR | Status: DC | PRN

## 2020-01-29 MED ORDER — IPRATROPIUM BROMIDE 0.02 % IN SOLN
0.5000 mg | Freq: Two times a day (BID) | RESPIRATORY_TRACT | Status: DC
  Administered 2020-01-30: 0.5 mg via RESPIRATORY_TRACT
  Filled 2020-01-29: qty 2.5

## 2020-01-29 MED ORDER — TRAMADOL HCL 50 MG PO TABS
50.0000 mg | ORAL_TABLET | Freq: Once | ORAL | Status: AC
  Administered 2020-01-29: 50 mg via ORAL
  Filled 2020-01-29: qty 1

## 2020-01-29 MED ORDER — HYDRALAZINE HCL 20 MG/ML IJ SOLN: 5.0000 mg | INTRAMUSCULAR | Status: DC | PRN

## 2020-01-29 MED ORDER — MORPHINE SULFATE (PF) 2 MG/ML IV SOLN: 2.0000 mg | INTRAVENOUS | Status: DC | PRN

## 2020-01-29 MED ORDER — BUDESONIDE 0.5 MG/2ML IN SUSP
0.5000 mg | Freq: Two times a day (BID) | RESPIRATORY_TRACT | Status: DC
  Administered 2020-01-29 – 2020-02-03 (×11): 0.5 mg via RESPIRATORY_TRACT
  Filled 2020-01-29 (×12): qty 2

## 2020-01-29 MED ORDER — METHYLPREDNISOLONE SODIUM SUCC 125 MG IJ SOLR
80.0000 mg | Freq: Every day | INTRAMUSCULAR | Status: DC
  Administered 2020-01-30: 80 mg via INTRAVENOUS
  Filled 2020-01-29: qty 2

## 2020-01-29 MED ORDER — CHLORHEXIDINE GLUCONATE CLOTH 2 % EX PADS
6.0000 | MEDICATED_PAD | Freq: Every day | CUTANEOUS | Status: DC
  Administered 2020-01-29 – 2020-02-02 (×5): 6 via TOPICAL

## 2020-01-29 MED ORDER — NALOXONE HCL 2 MG/2ML IJ SOSY
2.0000 mg | PREFILLED_SYRINGE | Freq: Once | INTRAMUSCULAR | Status: AC
  Administered 2020-01-29: 06:00:00 2 mg via INTRAVENOUS
  Filled 2020-01-29: qty 2

## 2020-01-29 MED ORDER — SODIUM CHLORIDE 0.9% FLUSH
3.0000 mL | Freq: Two times a day (BID) | INTRAVENOUS | Status: DC
  Administered 2020-01-29 – 2020-01-31 (×5): 3 mL via INTRAVENOUS

## 2020-01-29 MED ORDER — HYDROCODONE-ACETAMINOPHEN 5-325 MG PO TABS: 1.0000 | ORAL_TABLET | ORAL | Status: DC | PRN

## 2020-01-29 MED ORDER — NALOXONE HCL 2 MG/2ML IJ SOSY
2.0000 mg | PREFILLED_SYRINGE | Freq: Once | INTRAMUSCULAR | Status: AC
  Administered 2020-01-29: 2 mg via INTRAVENOUS

## 2020-01-29 MED ORDER — NICOTINE 14 MG/24HR TD PT24
14.0000 mg | MEDICATED_PATCH | Freq: Every day | TRANSDERMAL | Status: DC
  Administered 2020-01-29 – 2020-01-30 (×2): 14 mg via TRANSDERMAL
  Filled 2020-01-29 (×5): qty 1

## 2020-01-29 MED ORDER — LACTATED RINGERS IV SOLN: INTRAVENOUS | Status: DC

## 2020-01-29 MED ORDER — BISACODYL 5 MG PO TBEC: 5.0000 mg | DELAYED_RELEASE_TABLET | Freq: Every day | ORAL | Status: DC | PRN

## 2020-01-29 MED ORDER — SODIUM CHLORIDE 0.9 % IV SOLN
500.0000 mg | Freq: Once | INTRAVENOUS | Status: AC
  Administered 2020-01-29: 500 mg via INTRAVENOUS
  Filled 2020-01-29: qty 500

## 2020-01-29 MED ORDER — CITALOPRAM HYDROBROMIDE 20 MG PO TABS
20.0000 mg | ORAL_TABLET | Freq: Every day | ORAL | Status: DC
  Administered 2020-01-30 – 2020-02-03 (×5): 20 mg via ORAL
  Filled 2020-01-29 (×5): qty 1

## 2020-01-29 MED ORDER — LEVOTHYROXINE SODIUM 75 MCG PO TABS
75.0000 ug | ORAL_TABLET | Freq: Every day | ORAL | Status: DC
  Administered 2020-01-30: 75 ug via ORAL
  Filled 2020-01-29: qty 1

## 2020-01-29 MED ORDER — ENOXAPARIN SODIUM 40 MG/0.4ML ~~LOC~~ SOLN
40.0000 mg | SUBCUTANEOUS | Status: DC
  Administered 2020-01-29 – 2020-02-02 (×5): 40 mg via SUBCUTANEOUS
  Filled 2020-01-29 (×6): qty 0.4

## 2020-01-29 MED ORDER — POLYETHYLENE GLYCOL 3350 17 G PO PACK: 17.0000 g | PACK | Freq: Every day | ORAL | Status: DC | PRN

## 2020-01-29 MED ORDER — FLUTICASONE FUROATE-VILANTEROL 200-25 MCG/INH IN AEPB
1.0000 | INHALATION_SPRAY | Freq: Every day | RESPIRATORY_TRACT | Status: DC
  Administered 2020-01-29 – 2020-01-30 (×2): 1 via RESPIRATORY_TRACT
  Filled 2020-01-29: qty 28

## 2020-01-29 MED ORDER — ARFORMOTEROL TARTRATE 15 MCG/2ML IN NEBU
15.0000 ug | INHALATION_SOLUTION | Freq: Two times a day (BID) | RESPIRATORY_TRACT | Status: DC
  Administered 2020-01-29 – 2020-02-03 (×10): 15 ug via RESPIRATORY_TRACT
  Filled 2020-01-29 (×12): qty 2

## 2020-01-29 MED ORDER — UMECLIDINIUM BROMIDE 62.5 MCG/INH IN AEPB
1.0000 | INHALATION_SPRAY | Freq: Every day | RESPIRATORY_TRACT | Status: DC
  Administered 2020-01-29 – 2020-01-30 (×2): 1 via RESPIRATORY_TRACT
  Filled 2020-01-29: qty 7

## 2020-01-29 MED ORDER — METHYLPREDNISOLONE SODIUM SUCC 125 MG IJ SOLR
125.0000 mg | Freq: Once | INTRAMUSCULAR | Status: AC
  Administered 2020-01-29: 13:00:00 125 mg via INTRAVENOUS
  Filled 2020-01-29: qty 2

## 2020-01-29 MED ORDER — IPRATROPIUM BROMIDE 0.02 % IN SOLN
0.5000 mg | Freq: Four times a day (QID) | RESPIRATORY_TRACT | Status: DC
  Administered 2020-01-29 (×3): 0.5 mg via RESPIRATORY_TRACT
  Filled 2020-01-29 (×2): qty 2.5

## 2020-01-29 MED ORDER — ACETAMINOPHEN 325 MG PO TABS
650.0000 mg | ORAL_TABLET | Freq: Four times a day (QID) | ORAL | Status: DC | PRN
  Administered 2020-01-30: 650 mg via ORAL
  Filled 2020-01-29: qty 2

## 2020-01-29 MED ORDER — DEXTROSE 5 % IV SOLN
250.0000 mg | INTRAVENOUS | Status: DC
  Administered 2020-01-30: 250 mg via INTRAVENOUS
  Filled 2020-01-29 (×2): qty 250

## 2020-01-29 NOTE — ED Notes (Signed)
Pt has not urinated the entire shift.  Bladder scanner showed 310.  Pt states she does not feel need to urinate.   Dr Lorin Mercy to place foley order.

## 2020-01-29 NOTE — ED Notes (Signed)
When trying to ask the patient pertinent questions, the patient is difficult to arouse. She will respond and fall back asleep immediately.

## 2020-01-29 NOTE — Progress Notes (Signed)
Tried to get patient to take her Breo, and Incruse as ordered, patient is too confused and unable to close her lips and inspire in the medicine after many tries the patient is still unable to get the dry powder inhaler into her lungs, patient would benefit more from nebulizer at this time. RN informed.

## 2020-01-29 NOTE — Progress Notes (Signed)
ABG results given to [place] at 1115, RN stated she will call MD with results.

## 2020-01-29 NOTE — ED Triage Notes (Signed)
Patient arrived from home with a chief complaint of dyspnea only when her BiPap is not working that started 2 days ago when the machine broke. EMS was unable to fix the machine. On room air, reported SPO2 of 54% after BiPAP use. 100% 2 liters nasal cannula. Only complaint is exhaustion.

## 2020-01-29 NOTE — H&P (Signed)
History and Physical    Jasmine [name] [id number] DOB: 28-Jan-1946 DOA: 01/29/2020  PCP: Biagio Borg, MD Consultants:  [facility] - pulmonology Patient coming from:  Home - lives with ex-husband; NOK: Ex-husband, [name], [phone]  Chief Complaint:  Dyspnea  HPI: Jasmine [name] is a 74 y.o. female with medical history significant of hypothyroidism s/p RAI; HTN; COPD on 3-4L home O2 and nightly BIPAP; and chronic diastolic CHF presenting with dyspnea.  She was last admitted from 2/14-17 for acute on chronic respiratory failure with hypercardia; she was treated with BIPAP and breathing treatments and discharged with home health on 4L [place] O2.  This time, she reported that her BIPAP machine broke 2 days ago and HH was unable to fix it.  She has felt very tired.  At the time of my evaluation, she would open her eyes minimally but did turn her head in my direction.  She did not make significant effort to communicate but was able to follow some directions.  Her husband reports that she is supposed to be on 3L home O2 24/7.  He reports that this causes a build-up of CO2 and she is supposed to use BIPAP - "sometimes she uses it, sometimes she don't."  She used it maybe an hour last night and none the night before.  He reports that the mask "wasn't fitting exactly right" and they made some adjustments.  The medics came and got it fitted, but EMS realized that the concentrator might be too far from the bed.  He noticed confusion starting day before yesterday.  He does not think he has been taking her medication - she took none yesterday and maybe not the day before either.  He has noticed "a slight case of dementia in her" and so does get feisty at times.  He thinks she would benefit from placement, but he doesn't know how she would respond to that; he would like to request her last wishes.  He would want to involve her nieces [name] and Sunday Spillers) in decisions about moving forward and he is open to  palliative care.   ED Course: Chronically hypercarbic, pCO2 107.  COPD exacerbation.  PCCM has consulted but ok with admission to [facility].  CPAP mask broke and needs a new mask.  Review of Systems: Unable to perform  Ambulatory Status:  Ambulates without assistance  COVID Vaccine Status:  Complete  Past Medical History:  Diagnosis Date  . ALLERGIC RHINITIS 05/01/2007  . Anxiety   . Arthritis    "hands" (12/28/2015)  . ASTHMA 05/01/2007   "since I was a child"  . Cancer (HCC)    LUNG  . CHF (congestive heart failure) ([place])   . Chronic bronchitis ([place])   . COPD (chronic obstructive pulmonary disease) ([place])   . DEPRESSION 07/21/2008  . DVT (deep venous thrombosis) (HCC) 1960s   LLE  . FATIGUE 10/10/2010  . HYPERTENSION 05/01/2007  . HYPOTHYROIDISM 08/23/2009   s/p RAI  . LUMBAR RADICULOPATHY, RIGHT 08/25/2008  . On home oxygen therapy    "3-4L; qd; all the time" (12/28/2015)  . OSTEOPOROSIS 05/01/2007  . SHOULDER PAIN, LEFT 08/23/2009  . SINUSITIS, CHRONIC 10/10/2010    Past Surgical History:  Procedure Laterality Date  . APPENDECTOMY    . DILATION AND CURETTAGE OF UTERUS  multiple   history of multiple dialations and curettages and miscarriages, unfortunately never carrying a child to term  . ECTOPIC PREGNANCY SURGERY  "late '60s or early '70s  . ELECTROCARDIOGRAM  06/20/2006  . OOPHORECTOMY Right     Social History   Socioeconomic History  . Marital status: Significant Other    Spouse name: Not on file  . Number of children: 0  . Years of education: Not on file  . Highest education level: Not on file  Occupational History  . Occupation: RETIRED - Facilities manager: [employer]  Tobacco Use  . Smoking status: Former Smoker    Packs/day: 1.00    Years: 52.00    Pack years: 52.00    Types: Cigarettes    Start date: 1965    Quit date: 07/20/2016    Years since quitting: 3.5  . Smokeless tobacco: Never Used  Substance and Sexual Activity  .  Alcohol use: Not Currently  . Drug use: Not Currently  . Sexual activity: Not Currently    Partners: Male    Birth control/protection: Post-menopausal  Other Topics Concern  . Not on file  Social History Narrative   Lives in [place], [place]. Lives with husband, no children, but husband has 3 kids. Ambulatory, without cane or walker. Smokes cigarettes, currently 1 ppd, smoked for >20-30 yrs. Drinks beer, ~2/day, occasionally liquor, but ? suspect minimizing. Occasional MJ and endorses crack cocaine occasionally   Social Determinants of Health   Financial Resource Strain:   . Difficulty of Paying Living Expenses:   Food Insecurity: No Food Insecurity  . Worried About Charity fundraiser in the Last Year: Never true  . Ran Out of Food in the Last Year: Never true  Transportation Needs: No Transportation Needs  . Lack of Transportation (Medical): No  . Lack of Transportation (Non-Medical): No  Physical Activity:   . Days of Exercise per Week:   . Minutes of Exercise per Session:   Stress:   . Feeling of Stress :   Social Connections: Unknown  . Frequency of Communication with Friends and Family: Not asked  . Frequency of Social Gatherings with Friends and Family: Not asked  . Attends Religious Services: Not asked  . Active Member of Clubs or Organizations: Not asked  . Attends Archivist Meetings: Not asked  . Marital Status: Not asked  Intimate Partner Violence: Unknown  . Fear of Current or Ex-Partner: Not asked  . Emotionally Abused: Not asked  . Physically Abused: Not asked  . Sexually Abused: Not asked    Allergies  Allergen Reactions  . Alendronate Sodium Other (See Comments)    Patient does not remember this reaction    Family History  Problem Relation Age of Onset  . Lung cancer Father   . Alcohol abuse Brother   . Diabetes Brother   . Hypertension Other   . Stroke Mother   . Thyroid disease Mother   . Asthma Other        maternal aunts  . Breast  cancer Neg Hx     Prior to Admission medications   Medication Sig Start Date End Date Taking? Authorizing Provider  albuterol (PROVENTIL) (2.5 MG/3ML) 0.083% nebulizer solution Take 3 mLs (2.5 mg total) by nebulization every 6 (six) hours as needed for wheezing or shortness of breath. 11/30/19  Yes Biagio Borg, MD  albuterol (VENTOLIN HFA) 108 (90 Base) MCG/ACT inhaler INHALE 1 TO 2 PUFFS INTO THE LUNGS EVERY 6 (SIX) HOURS AS NEEDED FOR WHEEZING OR SHORTNESS OF BREATH. Patient taking differently: Inhale 2 puffs into the lungs every 6 (six) hours as needed for shortness of breath.  11/30/19  Yes Biagio Borg, MD  aspirin EC 81 MG EC tablet Take 1 tablet (81 mg total) by mouth daily. 03/27/16  Yes Velvet Bathe, MD  atorvastatin (LIPITOR) 10 MG tablet TAKE ONE TABLET BY MOUTH IN THE EVENING Patient taking differently: Take 10 mg by mouth daily at 6 PM. TAKE ONE TABLET BY MOUTH IN THE EVENING 12/13/19  Yes Biagio Borg, MD  citalopram (CELEXA) 20 MG tablet NEW PRESCRIPTION REQUEST: CITALOPRAM 20MG  - TAKE ONE TABLET BY MOUTH DAILY Patient taking differently: Take 20 mg by mouth daily.  11/30/19  Yes Biagio Borg, MD  fluticasone furoate-vilanterol (BREO ELLIPTA) 200-25 MCG/INH AEPB Inhale 1 puff into the lungs daily. 11/30/19  Yes Biagio Borg, MD  furosemide (LASIX) 40 MG tablet NEW PRESCRIPTION REQUEST: FUROSEMIDE 40MG  - TAKE ONE TABLET BY MOUTH IN THE MORNING AND TAKE ONE TABLET BY MOUTH AS NEEDED IN THE EVENING Patient taking differently: Take 40 mg by mouth 2 (two) times daily.  11/30/19  Yes Biagio Borg, MD  levothyroxine (SYNTHROID) 75 MCG tablet NEW PRESCRIPTION REQUEST: LEVOTHYROXINE 75MCG - TAKE ONE TABLET BY MOUTH EARLY MORNING Patient taking differently: Take 75 mcg by mouth daily.  11/30/19  Yes Biagio Borg, MD  potassium chloride (KLOR-CON) 10 MEQ tablet NEW PRESCRIPTION REQUEST: KLOR-CON 10MEQ - TAKE ONE TABLET BY MOUTH TWICE DAILY Patient taking differently: Take 10 mEq by mouth 2  (two) times daily.  11/30/19  Yes Biagio Borg, MD  tiotropium (SPIRIVA HANDIHALER) 18 MCG inhalation capsule NEW PRESCRIPTION REQUEST: SPIRIVA 18MCG [place] OF THE CONTENTS OF ONE CAPSULE BY ONCE DAILY Patient taking differently: Place 18 mcg into inhaler and inhale daily.  11/30/19  Yes Biagio Borg, MD  traMADol (ULTRAM) 50 MG tablet Take 1 tablet (50 mg total) by mouth 2 (two) times daily as needed. Patient taking differently: Take 50 mg by mouth 2 (two) times daily as needed for moderate pain.  11/30/19  Yes Biagio Borg, MD  OXYGEN Inhale 3 L into the lungs. 24/7    [provider]    Physical Exam: Vitals:   01/29/20 0935 01/29/20 1016 01/29/20 1022 01/29/20 1158  BP:    116/60  Pulse:    73  Resp:    20  Temp:      TempSrc:      SpO2: 100% 94% 95% 96%     . General:  Appears somnolent, minimally responsive . Eyes:  normal lids . ENT:  BIPAP mask in place . Neck:  no LAD, masses or thyromegaly . Cardiovascular:  RRR, no m/r/g. No LE edema.  [name] Respiratory:   Poor air movement; few wheezes.  Normal respiratory effort. . Abdomen:  soft, NT, ND, NABS . Skin:  no rash or induration seen on limited exam . Musculoskeletal:  grossly normal tone BUE/BLE, good ROM, no bony abnormality . Psychiatric:  Awakens to voice, follows commands . Neurologic:  Unable to perform    Radiological Exams on Admission: DG Chest Portable 1 View  Result Date: 01/29/2020 CLINICAL DATA:  Initial evaluation for acute hypoxia. EXAM: PORTABLE CHEST 1 VIEW COMPARISON:  Prior radiograph from 12/04/2019. FINDINGS: Transverse heart size at the upper limits of normal. Mediastinal silhouette within normal limits. Aortic atherosclerosis. Lungs normally inflated. Oblique linear density at the right upper lobe most likely reflects scarring, stable from previous. No focal infiltrates. No edema or definite pleural effusion. No pneumothorax. No acute osseous finding. IMPRESSION: 1.  Stable  appearance of the chest. No active cardiopulmonary disease. 2.  Aortic Atherosclerosis (ICD10-I70.0). Electronically Signed   By: Jeannine Boga M.D.   On: 01/29/2020 05:58    EKG: Independently reviewed.  NSR with rate 95; no evidence of acute ischemia   Labs on Admission: I have personally reviewed the available labs and imaging studies at the time of the admission.  Pertinent labs:   Glucose 152 HS troponin 6 Unremarkable CBC UA: 5 ketones, 30 protein UDS negative ETOH <10 ASA <7 ABG 7.233/107/74.4/93.2% Respiratory panel PCR negative   Assessment/Plan Principal Problem:   Acute on chronic respiratory failure with hypercapnia (HCC) Active Problems:   Essential hypertension   Tobacco abuse disorder   Chronic diastolic congestive heart failure (HCC)   Hyperglycemia   COPD with acute exacerbation (HCC)   Hypothyroidism   Chronic low back pain    Acute on chronic respiratory failure, likely associated with non-compliance, possibly also COPD exacerbation -Patient with periodic admissions for the same -She is supposed to be on 3L home O2 and nightly BIPAP -Her ex-husband reports that she is often non-compliant with the BIPAP and this leads to confusion and then hospitalization -The patient reported that her mask was not working at home, although her husband appears less certain about this -PCCM was initially called for admission but they have deferred admission to [facility] -Will admit to [facility] with ongoing BIPAP -Due to concern for possible COPD exacerbation, PCCM has started Azithromycin and Solumedrol -Continue Albuterol, Breo Ellipta, Spiriva  Chronic diastolic CHF -Continue ASA  HTN -She does not appear to be taking medication for this issue at this time  Hyperglycemia -May be stress response or steroid response -Will follow with fasting AM labs and check A1c  HLD -Continue Lipitor  Hypothyroidism -Check TSH -Continue Synthroid at current  dose for now  Chronic pain -I have reviewed this patient in the [place] Controlled Substances Reporting System.  She is receiving intermittent prescriptions for Tramadol from only one provider. -She is not at particularly high risk of opioid misuse, diversion, or overdose.  Tobacco dependence -Encourage cessation.   -Patch ordered  Goals of care -Patient is confirmed to be DNR -This is her 3rd admission in the last 6 months -She has a pattern of noncompliance and her husband is concerned for developing dementia -Palliative care consultation requested   Note: This patient has been tested and is negative for the novel coronavirus COVID-19.  DVT prophylaxis:   Lovenox  Code Status:  DNR - confirmed with family Family Communication: None present; I spoke with the patient's ex-husband by telephone at the time of admission. Disposition Plan:  The patient is from: home  Anticipated d/c is to: home with [facility] services once her respiratory issues have been resolved.  Anticipated d/c date will depend on clinical response to treatment, but possibly as early as 1-2 days if she has excellent response to treatment  Patient is currently: acutely ill Consults called: PCCM; Palliative care; TOC team Admission status:  Admit - It is my clinical opinion that admission to INPATIENT is reasonable and necessary because of the expectation that this patient will require hospital care that crosses at least 2 midnights to treat this condition based on the medical complexity of the problems presented.  Given the aforementioned information, the predictability of an adverse outcome is felt to be significant.    Karmen Bongo MD [facility]   How to contact the [facility] Attending or Consulting provider [place] or covering provider  during after hours 7P -7A, for this patient?  1. Check the care team in [facility] and look for a) attending/consulting TR[facility] provider listed and b) the [facility] team listed 2. Log into [email or web address]  and use [place]'s universal password to access. If you do not have the password, please contact the hospital operator. 3. Locate the [facility] provider you are looking for under [facility] and page to a number that you can be directly reached. 4. If you still have difficulty reaching the provider, please page the [facility] (Director on Call) for the Hospitalists listed on amion for assistance.   01/29/2020, 12:15 PM

## 2020-01-29 NOTE — Consult Note (Signed)
NAME:  Jasmine [name], MRN:  [id number], DOB:  [birth year], LOS: 0 ADMISSION DATE:  01/29/2020, CONSULTATION DATE:  01/29/20 REFERRING MD:  Dr. Randal Buba, CHIEF COMPLAINT:  Dyspnea   Brief History   74 y/o F admitted 4/11 with complaints of dyspnea. Work up consistent with acute on chronic hypercarbic respiratory failure in setting of broken home CPAP machine.   History of present illness   74 y/o F who presented to [facility] on 4/11 with reports of dyspnea.    The patient reports she lives with her ex-husband.  She indicates her home CPAP machine has been broken and unable to wear.  Machine is reportedly 44-68 years old.  She is followed by [facility].  Chart review shows she was admitted from 2/14 - 2/17 for acute on chronic hypoxic / hypercarbic respiratory failure in the setting of failure to use BiPAP due to power loss during bad weather.  She was treated with steroids and nebulized bronchodilators with improvement.   She presented 4/11 by EMS from home with reports of a broken BiPAP machine.  The machine reportedly stopped working two days prior to admit.  EMS made attempts to fix the machine but were unable.  Her room air saturations were 54% (she was discharged on 4L during last admit). She denied infectious symptoms but reported she was fatigued.  Initial work up found her to be hypercarbic with a pCO2 of 107 (baseline in the 80's).  Labs otherwise were unremarkable. COVID / Influenza negative.  CXR without acute disease.    PCCM called for evaluation of acute on chronic hypercarbic / hypoxic respiratory failure.   Past Medical History  Lung Cancer  COPD  Chronic Hypoxic Hypercarbic Respiratory Failure - 3-4L O2 dependent + QHS BIPAP Tobacco Abuse  Hypothyroidism  HTN HLD Chronic Diastolic CHF   Significant Hospital Events   4/11 Admit with hypercarbia  Consults:  PCCM  Procedures:    Significant Diagnostic Tests:   CXR 4/11 >> no active disease  Micro Data:  COVID PCR 4/11 >>  negative  Influenza A/B 4/11 >> negative   Antimicrobials:     Interim history/subjective:  RN reports pt remains on BiPAP, interactive when name called. No acute events.   Objective   Blood pressure (!) 148/84, pulse 92, temperature 98 F (36.7 C), temperature source Oral, resp. rate 12, SpO2 96 %.    Vent Mode: PCV FiO2 (%):  [40 %] 40 % Set Rate:  [10 bmp] 10 bmp PEEP:  [6 cmH20] 6 cmH20  No intake or output data in the 24 hours ending 01/29/20 0727 There were no vitals filed for this visit.  Examination: General: elderly female lying in bed in NAD on bipap  HEENT: MM pink/dry, face mask in place, pupils 2mm, anicteric  Neuro: awakens to voice, speech clear, moves all extremities, drowsy / falls back to sleep when not interacting with staff but appropriate when interacting, asterixis noted CV: s1s2 rrr, no m/r/g PULM:  Non-labored on BiPAP, lungs diminished, sibilant wheeze bilaterally, faint crackles GI: soft, bsx4 active  Extremities: warm/dry, no edema  Skin: no rashes or lesions  Resolved Hospital Problem list      Assessment & Plan:   Acute on Chronic Hypoxic / Hypercarbic Respiratory Failure  BiPAP machine reportedly broken but she was found with room air saturations of 54% which raises the question of compliance.  Baseline 3-4L O2 dependent.  -continue BiPAP therapy for now, then QHS & PRN daytime sleeping -brovana + pulmicort, atrovent  Q6 -PRN albuterol Q4 -follow intermittent CXR  -hold any sedating medications  -upright positioning, mobilize as able  -will need to coordinate with home health to determine if her machine can be fixed / replaced before returning home -educate patient on smoking cessation, compliance with therapies  -will need to clarify with patient mechanical ventilation desires given her trajectory of illness  COPD without Acute Exacerbation  -nebulizer's as above  -if change in exam, may need to consider brief steroids  -? If she would  be a daliresp / azithro candidate given frequent admits but primarily concerned she is not compliant -consider outpatient pulmonary rehab  -may need to consider palliative care input   All other medical problems per [facility]   Best practice:  Diet: NPO for now while on BiPAP Pain/Anxiety/Delirium protocol (if indicated): n/a VAP protocol (if indicated): n/a DVT prophylaxis: per primary  GI prophylaxis: per primary  Glucose control: per primary  Mobility: As tolerated  Code Status: Full Code Family Communication: Patient updated on plan of care  Disposition:   Labs   CBC: Recent Labs  Lab 01/29/20 0515 01/29/20 0529  WBC 6.4  --   NEUTROABS 5.0  --   HGB 12.5 14.3  HCT 42.8 42.0  MCV 106.5*  --   PLT 159  --     Basic Metabolic Panel: Recent Labs  Lab 01/29/20 0529  NA 136  K 5.3*  CL 91*  GLUCOSE 152*  BUN 18  CREATININE 0.70   GFR: CrCl cannot be calculated (Unknown ideal weight.). Recent Labs  Lab 01/29/20 0515  WBC 6.4    Liver Function Tests: No results for input(s): AST, ALT, ALKPHOS, BILITOT, PROT, ALBUMIN in the last 168 hours. No results for input(s): LIPASE, AMYLASE in the last 168 hours. No results for input(s): AMMONIA in the last 168 hours.  ABG    Component Value Date/Time   PHART 7.233 (L) 01/29/2020 0539   PCO2ART 107 (HH) 01/29/2020 0539   PO2ART 74.4 (L) 01/29/2020 0539   HCO3 43.7 (H) 01/29/2020 0539   TCO2 45 (H) 01/29/2020 0529   O2SAT 93.2 01/29/2020 0539     Coagulation Profile: No results for input(s): INR, PROTIME in the last 168 hours.  Cardiac Enzymes: No results for input(s): CKTOTAL, CKMB, CKMBINDEX, TROPONINI in the last 168 hours.  HbA1C: Hgb A1c MFr Bld  Date/Time Value Ref Range Status  09/20/2018 02:26 PM 6.1 4.6 - 6.5 % Final    Comment:    Glycemic Control Guidelines for People with Diabetes:Non Diabetic:  <6%Goal of Therapy: <7%Additional Action Suggested:  >8%   03/29/2018 02:40 AM 5.7 (H) 4.8 - 5.6 % Final     Comment:    (NOTE) Pre diabetes:          5.7%-6.4% Diabetes:              >6.4% Glycemic control for   <7.0% adults with diabetes     CBG: No results for input(s): GLUCAP in the last 168 hours.  Review of Systems: Positives in bold   Gen: Denies fever, chills, weight change, fatigue, night sweats HEENT: Denies blurred vision, double vision, hearing loss, tinnitus, sinus congestion, rhinorrhea, sore throat, neck stiffness, dysphagia PULM: Denies shortness of breath, cough, sputum production, hemoptysis, wheezing CV: Denies chest pain, edema, orthopnea, paroxysmal nocturnal dyspnea, palpitations GI: Denies abdominal pain, nausea, vomiting, diarrhea, hematochezia, melena, constipation, change in bowel habits GU: Denies dysuria, hematuria, polyuria, oliguria, urethral discharge Endocrine: Denies hot or cold intolerance, polyuria,  polyphagia or appetite change Derm: Denies rash, dry skin, scaling or peeling skin change Heme: Denies easy bruising, bleeding, bleeding gums Neuro: Denies headache, numbness, weakness, slurred speech, loss of memory or consciousness  Past Medical History  She,  has a past medical history of ALLERGIC RHINITIS (05/01/2007), Anxiety, Arthritis, ASTHMA (05/01/2007), Cancer ([place]), CHF (congestive heart failure) ([place]), Chronic bronchitis ([place]), COPD (chronic obstructive pulmonary disease) ([place]), DEPRESSION (07/21/2008), DVT (deep venous thrombosis) ([place]) (1960s), FATIGUE (10/10/2010), HYPERTENSION (05/01/2007), HYPERTHYROIDISM (11/23/2007), HYPOTHYROIDISM (08/23/2009), LUMBAR RADICULOPATHY, RIGHT (08/25/2008), On home oxygen therapy, OSTEOPOROSIS (05/01/2007), SHOULDER PAIN, LEFT (08/23/2009), and SINUSITIS, CHRONIC (10/10/2010).   Surgical History    Past Surgical History:  Procedure Laterality Date  . APPENDECTOMY    . DILATION AND CURETTAGE OF UTERUS  multiple   history of multiple dialations and curettages and miscarriages, unfortunately never carrying a child to term   . ECTOPIC PREGNANCY SURGERY  "late '60s or early '70s  . ELECTROCARDIOGRAM  06/20/2006  . OOPHORECTOMY Right      Social History   reports that she quit smoking about 3 years ago. Her smoking use included cigarettes. She started smoking about 56 years ago. She has a 52.00 pack-year smoking history. She has never used smokeless tobacco. She reports previous alcohol use. She reports previous drug use.   Family History   Her family history includes Alcohol abuse in her brother; Asthma in an other family member; Diabetes in her brother; Hypertension in an other family member; Lung cancer in her father; Stroke in her mother; Thyroid disease in her mother. There is no history of Breast cancer.   Allergies Allergies  Allergen Reactions  . Alendronate Sodium Other (See Comments)    Patient does not remember this reaction     Home Medications  Prior to Admission medications   Medication Sig Start Date End Date Taking? Authorizing Provider  albuterol (PROVENTIL) (2.5 MG/3ML) 0.083% nebulizer solution Take 3 mLs (2.5 mg total) by nebulization every 6 (six) hours as needed for wheezing or shortness of breath. 11/30/19   Biagio Borg, MD  albuterol (VENTOLIN HFA) 108 (90 Base) MCG/ACT inhaler INHALE 1 TO 2 PUFFS INTO THE LUNGS EVERY 6 (SIX) HOURS AS NEEDED FOR WHEEZING OR SHORTNESS OF BREATH. Patient taking differently: Inhale 2 puffs into the lungs every 6 (six) hours as needed for shortness of breath.  11/30/19   Biagio Borg, MD  aspirin EC 81 MG EC tablet Take 1 tablet (81 mg total) by mouth daily. 03/27/16   Velvet Bathe, MD  atorvastatin (LIPITOR) 10 MG tablet TAKE ONE TABLET BY MOUTH IN THE EVENING 12/13/19   Biagio Borg, MD  citalopram (CELEXA) 20 MG tablet NEW PRESCRIPTION REQUEST: CITALOPRAM 20MG  - TAKE ONE TABLET BY MOUTH DAILY Patient taking differently: Take 20 mg by mouth daily.  11/30/19   Biagio Borg, MD  fluticasone furoate-vilanterol (BREO ELLIPTA) 200-25 MCG/INH AEPB Inhale 1 puff  into the lungs daily. 11/30/19   Biagio Borg, MD  furosemide (LASIX) 40 MG tablet NEW PRESCRIPTION REQUEST: FUROSEMIDE 40MG  - TAKE ONE TABLET BY MOUTH IN THE MORNING AND TAKE ONE TABLET BY MOUTH AS NEEDED IN THE EVENING Patient taking differently: Take 40 mg by mouth 2 (two) times daily.  11/30/19   Biagio Borg, MD  levothyroxine (SYNTHROID) 75 MCG tablet NEW PRESCRIPTION REQUEST: LEVOTHYROXINE 75MCG - TAKE ONE TABLET BY MOUTH EARLY MORNING Patient taking differently: Take 75 mcg by mouth daily.  11/30/19   Biagio Borg, MD  OXYGEN  Inhale 3 L into the lungs. 24/7    [provider]  potassium chloride (KLOR-CON) 10 MEQ tablet NEW PRESCRIPTION REQUEST: KLOR-CON 10MEQ - TAKE ONE TABLET BY MOUTH TWICE DAILY Patient taking differently: Take 10 mEq by mouth 2 (two) times daily.  11/30/19   Biagio Borg, MD  predniSONE (DELTASONE) 20 MG tablet Take 2 tablets (40 mg total) by mouth daily with breakfast. Patient not taking: Reported on 12/14/2019 12/08/19   Mercy Riding, MD  tiotropium (SPIRIVA HANDIHALER) 18 MCG inhalation capsule NEW PRESCRIPTION REQUEST: SPIRIVA 18MCG HANDIHALER - TAKE TWO INHALATIONS OF THE CONTENTS OF ONE CAPSULE BY ONCE DAILY 11/30/19   Biagio Borg, MD  traMADol (ULTRAM) 50 MG tablet Take 1 tablet (50 mg total) by mouth 2 (two) times daily as needed. Patient taking differently: Take 50 mg by mouth 2 (two) times daily as needed for moderate pain.  11/30/19   Biagio Borg, MD     Critical care time: n/a     Noe Gens, MSN, NP-C [facility] 01/29/2020, 7:27 AM   Please see [email or web address] for pager details.

## 2020-01-29 NOTE — ED Provider Notes (Signed)
[facility] EMERGENCY DEPARTMENT Provider Note   CSN: [id number] Arrival date & time: 01/29/20  0455     History Chief Complaint  Patient presents with  . BiPAP not working    Jasmine [name] is a 74 y.o. female.  The history is provided by the EMS personnel. The history is limited by the condition of the patient.  Illness Location:  At home, bipap is not working x 2 days  Quality:  Fatigue somnolence  Severity:  Severe Onset quality:  Gradual Timing:  Constant Progression:  Worsening Chronicity:  New Context:  COPD and on bipap at night  Relieved by:  Nothing Worsened by:  No bipap Ineffective treatments:  None tried  Associated symptoms: no diarrhea, no fever and no vomiting   Risk factors:  COPD Post narcan patient admits to taking tramadol.       Past Medical History:  Diagnosis Date  . ALLERGIC RHINITIS 05/01/2007  . Anxiety   . Arthritis    "hands" (12/28/2015)  . ASTHMA 05/01/2007   "since I was a child"  . Cancer (HCC)    LUNG  . CHF (congestive heart failure) ([place])   . Chronic bronchitis ([place])   . COPD (chronic obstructive pulmonary disease) ([place])   . DEPRESSION 07/21/2008  . DVT (deep venous thrombosis) (HCC) 1960s   LLE  . FATIGUE 10/10/2010  . HYPERTENSION 05/01/2007  . HYPERTHYROIDISM 11/23/2007   Pt endorses having had Graves disease, possibly radioactive iodine x1, but no thyroidectomy  . HYPOTHYROIDISM 08/23/2009  . LUMBAR RADICULOPATHY, RIGHT 08/25/2008  . On home oxygen therapy    "3-4L; qd; all the time" (12/28/2015)  . OSTEOPOROSIS 05/01/2007  . SHOULDER PAIN, LEFT 08/23/2009  . SINUSITIS, CHRONIC 10/10/2010    Patient Active Problem List   Diagnosis Date Noted  . Malnutrition of moderate degree 08/16/2019  . Type II respiratory failure ([place]) 08/14/2019  . Acute respiratory failure with hypoxia and hypercarbia ([place]) 03/07/2019  . Respiratory acidosis 03/07/2019  . Respiratory failure ([place]) 12/16/2018  . Preventative  health care 09/20/2018  . Edema due to congestive heart failure ([place]) 07/27/2018  . Palliative care by specialist   . Shortness of breath   . Chronic low back pain 04/30/2018  . COPD with acute exacerbation ([place])   . Hypothyroidism   . Anxiety   . Respiratory failure with hypercapnia ([place]) 03/24/2018  . Hypercarbia 03/22/2018  . Polysubstance abuse ([place]) 01/26/2018  . Aspiration into airway 06/03/2017  . Rash 07/12/2016  . Bilateral hearing loss 06/05/2016  . Hyperglycemia 05/21/2016  . Abnormal LFTs 03/24/2016  . Elevated blood sugar 03/24/2016  . Solitary pulmonary nodule 01/09/2016  . Chronic respiratory failure with hypoxia ([place])   . On home oxygen therapy   . Chronic diastolic congestive heart failure ([place])   . Bacteremia Step viridans 12/27/2015  . Tobacco abuse disorder 12/25/2015  . Hypothyroidism following radioiodine therapy 09/26/2014  . Protein-calorie malnutrition, severe ([place]) 06/23/2014  . Cor pulmonale ([place]) 01/12/2013  . COPD (chronic obstructive pulmonary disease) ([place]) 11/29/2011  . Macrocytosis without anemia 11/09/2011  . Palliative care encounter 04/09/2011  . SINUSITIS, CHRONIC 10/10/2010  . FATIGUE 10/10/2010  . Depression with anxiety 07/21/2008  . Essential hypertension 05/01/2007  . ALLERGIC RHINITIS 05/01/2007  . Asthma 05/01/2007  . OSTEOPOROSIS 05/01/2007    Past Surgical History:  Procedure Laterality Date  . APPENDECTOMY    . DILATION AND CURETTAGE OF UTERUS  multiple   history of multiple dialations and curettages and  miscarriages, unfortunately never carrying a child to term  . ECTOPIC PREGNANCY SURGERY  "late '60s or early '70s  . ELECTROCARDIOGRAM  06/20/2006  . OOPHORECTOMY Right      OB History    Gravida  5   Para      Term      Preterm      AB  5   Living        SAB  4   TAB      Ectopic  1   Multiple      Live Births              Family History  Problem Relation Age of Onset  . Lung cancer Father     . Alcohol abuse Brother   . Diabetes Brother   . Hypertension Other   . Stroke Mother   . Thyroid disease Mother   . Asthma Other        maternal aunts  . Breast cancer Neg Hx     Social History   Tobacco Use  . Smoking status: Former Smoker    Packs/day: 1.00    Years: 52.00    Pack years: 52.00    Types: Cigarettes    Start date: 1965    Quit date: 07/20/2016    Years since quitting: 3.5  . Smokeless tobacco: Never Used  Substance Use Topics  . Alcohol use: Not Currently  . Drug use: Not Currently    Home Medications Prior to Admission medications   Medication Sig Start Date End Date Taking? Authorizing Provider  albuterol (PROVENTIL) (2.5 MG/3ML) 0.083% nebulizer solution Take 3 mLs (2.5 mg total) by nebulization every 6 (six) hours as needed for wheezing or shortness of breath. 11/30/19   Biagio Borg, MD  albuterol (VENTOLIN HFA) 108 (90 Base) MCG/ACT inhaler INHALE 1 TO 2 PUFFS INTO THE LUNGS EVERY 6 (SIX) HOURS AS NEEDED FOR WHEEZING OR SHORTNESS OF BREATH. Patient taking differently: Inhale 2 puffs into the lungs every 6 (six) hours as needed for shortness of breath.  11/30/19   Biagio Borg, MD  aspirin EC 81 MG EC tablet Take 1 tablet (81 mg total) by mouth daily. 03/27/16   Velvet Bathe, MD  atorvastatin (LIPITOR) 10 MG tablet TAKE ONE TABLET BY MOUTH IN THE EVENING 12/13/19   Biagio Borg, MD  citalopram (CELEXA) 20 MG tablet NEW PRESCRIPTION REQUEST: CITALOPRAM 20MG  - TAKE ONE TABLET BY MOUTH DAILY Patient taking differently: Take 20 mg by mouth daily.  11/30/19   Biagio Borg, MD  fluticasone furoate-vilanterol (BREO ELLIPTA) 200-25 MCG/INH AEPB Inhale 1 puff into the lungs daily. 11/30/19   Biagio Borg, MD  furosemide (LASIX) 40 MG tablet NEW PRESCRIPTION REQUEST: FUROSEMIDE 40MG  - TAKE ONE TABLET BY MOUTH IN THE MORNING AND TAKE ONE TABLET BY MOUTH AS NEEDED IN THE EVENING Patient taking differently: Take 40 mg by mouth 2 (two) times daily.  11/30/19   Biagio Borg, MD  levothyroxine (SYNTHROID) 75 MCG tablet NEW PRESCRIPTION REQUEST: LEVOTHYROXINE 75MCG - TAKE ONE TABLET BY MOUTH EARLY MORNING Patient taking differently: Take 75 mcg by mouth daily.  11/30/19   Biagio Borg, MD  OXYGEN Inhale 3 L into the lungs. 24/7    [provider]  potassium chloride (KLOR-CON) 10 MEQ tablet NEW PRESCRIPTION REQUEST: KLOR-CON 10MEQ - TAKE ONE TABLET BY MOUTH TWICE DAILY Patient taking differently: Take 10 mEq by mouth 2 (two) times daily.  11/30/19  Biagio Borg, MD  predniSONE (DELTASONE) 20 MG tablet Take 2 tablets (40 mg total) by mouth daily with breakfast. Patient not taking: Reported on 12/14/2019 12/08/19   Mercy Riding, MD  tiotropium (SPIRIVA HANDIHALER) 18 MCG inhalation capsule NEW PRESCRIPTION REQUEST: SPIRIVA 18MCG HANDIHALER - TAKE TWO INHALATIONS OF THE CONTENTS OF ONE CAPSULE BY ONCE DAILY 11/30/19   Biagio Borg, MD  traMADol (ULTRAM) 50 MG tablet Take 1 tablet (50 mg total) by mouth 2 (two) times daily as needed. Patient taking differently: Take 50 mg by mouth 2 (two) times daily as needed for moderate pain.  11/30/19   Biagio Borg, MD    Allergies    Alendronate sodium  Review of Systems   Review of Systems  Unable to perform ROS: Acuity of condition  Constitutional: Negative for fever.  Gastrointestinal: Negative for diarrhea and vomiting.    Physical Exam Updated Vital Signs BP (!) 148/84   Pulse 92   Temp 98 F (36.7 C) (Oral)   Resp 12   SpO2 99%   Physical Exam Vitals and nursing note reviewed.  Constitutional:      Appearance: She is normal weight. She is not diaphoretic.  HENT:     Head: Normocephalic and atraumatic.     Nose: Nose normal.  Eyes:     Conjunctiva/sclera: Conjunctivae normal.     Comments: Pinpoint pupils   Cardiovascular:     Rate and Rhythm: Normal rate and regular rhythm.     Pulses: Normal pulses.     Heart sounds: Normal heart sounds.  Pulmonary:     Effort: Pulmonary effort is  normal.     Breath sounds: No rales.  Abdominal:     General: Abdomen is flat. Bowel sounds are normal. There is no distension.     Tenderness: There is no abdominal tenderness. There is no guarding.  Musculoskeletal:        General: Normal range of motion.     Cervical back: Normal range of motion and neck supple.     Right lower leg: No edema.     Left lower leg: No edema.  Skin:    General: Skin is warm and dry.     Capillary Refill: Capillary refill takes less than 2 seconds.  Neurological:     General: No focal deficit present.     Deep Tendon Reflexes: Reflexes normal.     ED Results / Procedures / Treatments   Labs (all labs ordered are listed, but only abnormal results are displayed) Results for orders placed or performed during the hospital encounter of 01/29/20  CBC with Differential/Platelet  Result Value Ref Range   WBC 6.4 4.0 - 10.5 K/uL   RBC 4.02 3.87 - 5.11 MIL/uL   Hemoglobin 12.5 12.0 - 15.0 g/dL   HCT 42.8 36.0 - 46.0 %   MCV 106.5 (H) 80.0 - 100.0 fL   MCH 31.1 26.0 - 34.0 pg   MCHC 29.2 (L) 30.0 - 36.0 g/dL   RDW 14.0 11.5 - 15.5 %   Platelets 159 150 - 400 K/uL   nRBC 0.0 0.0 - 0.2 %   Neutrophils Relative % 78 %   Neutro Abs 5.0 1.7 - 7.7 K/uL   Lymphocytes Relative 13 %   Lymphs Abs 0.8 0.7 - 4.0 K/uL   Monocytes Relative 7 %   Monocytes Absolute 0.4 0.1 - 1.0 K/uL   Eosinophils Relative 1 %   Eosinophils Absolute 0.0 0.0 - 0.5 K/uL  Basophils Relative 1 %   Basophils Absolute 0.1 0.0 - 0.1 K/uL   Immature Granulocytes 0 %   Abs Immature Granulocytes 0.02 0.00 - 0.07 K/uL  Blood gas, arterial  Result Value Ref Range   FIO2 28.00    pH, Arterial 7.233 (L) 7.350 - 7.450   pCO2 arterial 107 (HH) 32.0 - 48.0 mmHg   pO2, Arterial 74.4 (L) 83.0 - 108.0 mmHg   Bicarbonate 43.7 (H) 20.0 - 28.0 mmol/L   Acid-Base Excess 15.7 (H) 0.0 - 2.0 mmol/L   O2 Saturation 93.2 %   Patient temperature 36.6    Collection site LEFT RADIAL    Drawn by  COLLECTED BY RT    Sample type ARTERIAL    Allens test (pass/fail) PASS PASS  Urinalysis, Routine w reflex microscopic  Result Value Ref Range   Color, Urine YELLOW YELLOW   APPearance CLEAR CLEAR   Specific Gravity, Urine 1.025 1.005 - 1.030   pH 5.0 5.0 - 8.0   Glucose, UA NEGATIVE NEGATIVE mg/dL   Hgb urine dipstick NEGATIVE NEGATIVE   Bilirubin Urine NEGATIVE NEGATIVE   Ketones, ur 5 (A) NEGATIVE mg/dL   Protein, ur 30 (A) NEGATIVE mg/dL   Nitrite NEGATIVE NEGATIVE   Leukocytes,Ua NEGATIVE NEGATIVE   RBC / HPF 0-5 0 - 5 RBC/hpf   WBC, UA 0-5 0 - 5 WBC/hpf   Bacteria, UA NONE SEEN NONE SEEN   Mucus PRESENT    Hyaline Casts, UA PRESENT   I-stat chem 8, ED (not at [facility] or [facility])  Result Value Ref Range   Sodium 136 135 - 145 mmol/L   Potassium 5.3 (H) 3.5 - 5.1 mmol/L   Chloride 91 (L) 98 - 111 mmol/L   BUN 18 8 - 23 mg/dL   Creatinine, Ser 0.70 0.44 - 1.00 mg/dL   Glucose, Bld 152 (H) 70 - 99 mg/dL   Calcium, Ion 1.19 1.15 - 1.40 mmol/L   TCO2 45 (H) 22 - 32 mmol/L   Hemoglobin 14.3 12.0 - 15.0 g/dL   HCT 42.0 36.0 - 46.0 %  Troponin I (High Sensitivity)  Result Value Ref Range   Troponin I (High Sensitivity) 6 <18 ng/L   DG Chest Portable 1 View  Result Date: 01/29/2020 CLINICAL DATA:  Initial evaluation for acute hypoxia. EXAM: PORTABLE CHEST 1 VIEW COMPARISON:  Prior radiograph from 12/04/2019. FINDINGS: Transverse heart size at the upper limits of normal. Mediastinal silhouette within normal limits. Aortic atherosclerosis. Lungs normally inflated. Oblique linear density at the right upper lobe most likely reflects scarring, stable from previous. No focal infiltrates. No edema or definite pleural effusion. No pneumothorax. No acute osseous finding. IMPRESSION: 1. Stable appearance of the chest. No active cardiopulmonary disease. 2.  Aortic Atherosclerosis (ICD10-I70.0). Electronically Signed   By: Jeannine Boga M.D.   On: 01/29/2020 05:58    EKG EKG  Interpretation  Date/Time:  Sunday Alika Eppes 11 2021 05:01:40 EDT Ventricular Rate:  95 PR Interval:    QRS Duration: 90 QT Interval:  356 QTC Calculation: 448 R Axis:   99 Text Interpretation: Right and left arm electrode reversal, interpretation assumes no reversal Sinus rhythm Right axis deviation Confirmed by Randal Buba, Shant Hence ([id number]) on 01/29/2020 5:12:08 AM   Radiology DG Chest Portable 1 View  Result Date: 01/29/2020 CLINICAL DATA:  Initial evaluation for acute hypoxia. EXAM: PORTABLE CHEST 1 VIEW COMPARISON:  Prior radiograph from 12/04/2019. FINDINGS: Transverse heart size at the upper limits of normal. Mediastinal silhouette within normal limits. Aortic atherosclerosis.  Lungs normally inflated. Oblique linear density at the right upper lobe most likely reflects scarring, stable from previous. No focal infiltrates. No edema or definite pleural effusion. No pneumothorax. No acute osseous finding. IMPRESSION: 1. Stable appearance of the chest. No active cardiopulmonary disease. 2.  Aortic Atherosclerosis (ICD10-I70.0). Electronically Signed   By: Jeannine Boga M.D.   On: 01/29/2020 05:58    Procedures Procedures (including critical care time)  Medications Ordered in ED Medications  naloxone HCl (NARCAN) 4 mg in dextrose 5 % 250 mL infusion (has no administration in time range)  naloxone [facility]) injection 2 mg (2 mg Intravenous Given 01/29/20 0526)  naloxone [facility]) injection 2 mg (2 mg Intravenous Given 01/29/20 [id number])    ED Course  I have reviewed the triage vital signs and the nursing notes.  Pertinent labs & imaging results that were available during my care of the patient were reviewed by me and considered in my medical decision making (see chart for details).   Somnolent with pinpoint pupils and arouses post narcan but then becomes somnolent again.  Also found to have high CO2 transferred to negative pressure and bipap initiated immediately and narcan drip started.     MDM Reviewed: nursing note and vitals Interpretation: labs, ECG and x-ray (NACPD on CXR by me elevated CO2 by me on ABG) Total time providing critical care: 75-105 minutes (bipap initiated by me and narcan drip started bgy me ). This excludes time spent performing separately reportable procedures and services. Consults: critical care  CRITICAL CARE Performed by: Ursala Cressy K Ajane Novella-Rasch Total critical care time: 75 minutes Critical care time was exclusive of separately billable procedures and treating other patients. Critical care was necessary to treat or prevent imminent or life-threatening deterioration. Critical care was time spent personally by me on the following activities: development of treatment plan with patient and/or surrogate as well as nursing, discussions with consultants, evaluation of patient's response to treatment, examination of patient, obtaining history from patient or surrogate, ordering and performing treatments and interventions, ordering and review of laboratory studies, ordering and review of radiographic studies, pulse oximetry and re-evaluation of patient's condition. Final Clinical Impression(s) / ED Diagnoses Final diagnoses:  Altered mental status, unspecified altered mental status type  Hypoxia  Hypercapnia  Adverse effect of drug, initial encounter    Rx / DC Orders Admit to Critical care on bipap and narcan drip    Taneya Conkel, MD 01/29/20 [id number]

## 2020-01-29 NOTE — ED Notes (Signed)
Patient fell asleep and oxygen saturation dropped to 89%. Oxygen applied at 2 liters with no improvement. Oxygen returned to 6 liters and the patient is now at 99%.

## 2020-01-30 DIAGNOSIS — Z515 Encounter for palliative care: Secondary | ICD-10-CM

## 2020-01-30 DIAGNOSIS — R4182 Altered mental status, unspecified: Secondary | ICD-10-CM | POA: Diagnosis not present

## 2020-01-30 DIAGNOSIS — I5032 Chronic diastolic (congestive) heart failure: Secondary | ICD-10-CM | POA: Diagnosis not present

## 2020-01-30 DIAGNOSIS — Z7189 Other specified counseling: Secondary | ICD-10-CM

## 2020-01-30 DIAGNOSIS — G8929 Other chronic pain: Secondary | ICD-10-CM

## 2020-01-30 DIAGNOSIS — M545 Low back pain: Secondary | ICD-10-CM | POA: Diagnosis not present

## 2020-01-30 DIAGNOSIS — J441 Chronic obstructive pulmonary disease with (acute) exacerbation: Secondary | ICD-10-CM | POA: Diagnosis not present

## 2020-01-30 LAB — CBC
HCT: 38.9 % (ref 36.0–46.0)
Hemoglobin: 11.7 g/dL — ABNORMAL LOW (ref 12.0–15.0)
MCH: 31.1 pg (ref 26.0–34.0)
MCHC: 30.1 g/dL (ref 30.0–36.0)
MCV: 103.5 fL — ABNORMAL HIGH (ref 80.0–100.0)
Platelets: 149 10*3/uL — ABNORMAL LOW (ref 150–400)
RBC: 3.76 MIL/uL — ABNORMAL LOW (ref 3.87–5.11)
RDW: 14.2 % (ref 11.5–15.5)
WBC: 5.3 10*3/uL (ref 4.0–10.5)
nRBC: 0 % (ref 0.0–0.2)

## 2020-01-30 LAB — BASIC METABOLIC PANEL
Anion gap: 11 (ref 5–15)
BUN: 12 mg/dL (ref 8–23)
CO2: 39 mmol/L — ABNORMAL HIGH (ref 22–32)
Calcium: 9.5 mg/dL (ref 8.9–10.3)
Chloride: 91 mmol/L — ABNORMAL LOW (ref 98–111)
Creatinine, Ser: 0.66 mg/dL (ref 0.44–1.00)
GFR calc Af Amer: >60 mL/min (ref >60)
GFR calc non Af Amer: >60 mL/min (ref >60)
Glucose, Bld: 116 mg/dL — ABNORMAL HIGH (ref 70–99)
Potassium: 4.5 mmol/L (ref 3.5–5.1)
Sodium: 141 mmol/L (ref 135–145)

## 2020-01-30 LAB — HEMOGLOBIN A1C
Hgb A1c MFr Bld: 5.9 % — ABNORMAL HIGH (ref 4.8–5.6)
Mean Plasma Glucose: 123 mg/dL

## 2020-01-30 MED ORDER — IPRATROPIUM BROMIDE 0.02 % IN SOLN: 0.5000 mg | Freq: Three times a day (TID) | RESPIRATORY_TRACT | Status: DC

## 2020-01-30 MED ORDER — IPRATROPIUM BROMIDE 0.02 % IN SOLN
0.5000 mg | Freq: Two times a day (BID) | RESPIRATORY_TRACT | Status: DC
  Administered 2020-01-30 – 2020-02-03 (×8): 0.5 mg via RESPIRATORY_TRACT
  Filled 2020-01-30 (×8): qty 2.5

## 2020-01-30 MED ORDER — FUROSEMIDE 40 MG PO TABS
40.0000 mg | ORAL_TABLET | Freq: Every day | ORAL | Status: DC
  Administered 2020-01-30 – 2020-02-03 (×5): 40 mg via ORAL
  Filled 2020-01-30 (×5): qty 1

## 2020-01-30 MED ORDER — METHYLPREDNISOLONE SODIUM SUCC 40 MG IJ SOLR
40.0000 mg | Freq: Three times a day (TID) | INTRAMUSCULAR | Status: DC
  Administered 2020-01-30 – 2020-01-31 (×2): 40 mg via INTRAVENOUS
  Filled 2020-01-30 (×2): qty 1

## 2020-01-30 MED ORDER — LEVOTHYROXINE SODIUM 50 MCG PO TABS
50.0000 ug | ORAL_TABLET | Freq: Every day | ORAL | Status: DC
  Administered 2020-01-31 – 2020-02-03 (×4): 50 ug via ORAL
  Filled 2020-01-30 (×4): qty 1

## 2020-01-30 NOTE — Progress Notes (Signed)
NAME:  Jasmine [name], MRN:  [id number], DOB:  1946/03/02, LOS: 1 ADMISSION DATE:  01/29/2020, CONSULTATION DATE:  01/29/20 REFERRING MD:  Dr. Randal Buba, CHIEF COMPLAINT:  Dyspnea   Brief History   74 y/o F admitted 4/11 with complaints of dyspnea. Work up consistent with acute on chronic hypercarbic respiratory failure in setting of broken home CPAP machine.   History of present illness   74 y/o F who presented to [facility] on 4/11 with reports of dyspnea.    The patient reports she lives with her ex-husband.  She indicates her home CPAP machine has been broken and unable to wear.  Machine is reportedly 60-19 years old.  She is [facility] by [facility]  Chart review shows she was admitted from 2/14 - 2/17 for acute on chronic hypoxic / hypercarbic respiratory failure in the setting of failure to use BiPAP due to power loss during bad weather.  She was treated with steroids and nebulized bronchodilators with improvement.   She presented 4/11 by EMS from home with reports of a broken BiPAP machine.  The machine reportedly stopped working two days prior to admit.  EMS made attempts to fix the machine but were unable.  Her room air saturations were 54% (she was discharged on 4L during last admit). She denied infectious symptoms but reported she was fatigued.  Initial work up found her to be hypercarbic with a pCO2 of 107 (baseline in the 80's).  Labs otherwise were unremarkable. COVID / Influenza negative.  CXR without acute disease.    PCCM called for evaluation of acute on chronic hypercarbic / hypoxic respiratory failure.   Past Medical History  Lung Cancer  COPD  Chronic Hypoxic Hypercarbic Respiratory Failure - 3-4L O2 dependent + QHS BIPAP Tobacco Abuse  Hypothyroidism  HTN HLD Chronic Diastolic CHF   Significant Hospital Events   4/11 Admit with hypercarbia  Consults:  PCCM  Procedures:    Significant Diagnostic Tests:   CXR 4/11 >> no active disease  Micro Data:  COVID PCR 4/11 >>  negative  Influenza A/B 4/11 >> negative   Antimicrobials:     Interim history/subjective:  No issues overnight.  Wore BiPAP all night, now on 1L [place] Awake, very talkative, no complaints other than being hungry and dry throat  Objective   Blood pressure 128/74, pulse 68, temperature 97.8 F (36.6 C), temperature source Oral, resp. rate 17, SpO2 96 %.    FiO2 (%):  [35 %] 35 %   Intake/Output Summary (Last 24 hours) at 01/30/2020 0934 Last data filed at 01/30/2020 0100 Gross per 24 hour  Intake 771.67 ml  Output 375 ml  Net 396.67 ml   There were no vitals filed for this visit.  Examination: General:  Elderly female sitting upright in bed in NAD HEENT: MM pink/moist Neuro: Awake, alert, oriented x 3, MAE CV: rr, no murmur PULM:  Non labored, speaking full sentences without difficulty and sats lowest to 90% while talking on 1 L [place] (normally on 3L) otherwise, saturations > 94, diminished throughout, some air movement, no wheezes GI: soft, bsx4 active, foley  Extremities: warm/dry, no LE edema  Skin: no rashes   Resolved Hospital Problem list      Assessment & Plan:   Acute on Chronic Hypoxic / Hypercarbic Respiratory Failure  BiPAP machine reportedly broken but she was found with room air saturations of 54% which raises the question of compliance.  Baseline 3-4L O2 dependent.  P: Continue BiPAP prn daytime sleeping and  q HS Continue supplemental O2 for sat goal 88-95% Continue brovana and Pulmicort, prn albuterol, atrovent BID, and incruse  Decrease solumedrol to 40 mg daily  Caution with any sedating meds Diet ordered Continue ongoing pulmonary toilet, mobilize Will need to ensure her home BiPAP is working/ fixed prior to discharge    COPD, r/o Acute Exacerbation vs non-compliance P:  Nebs as above Decrease steroids- 80-> 40 mg daily Continue azithromax - 5 day course  Follow up pulmonary appt set up for April 26 at 230 pm with Rexene Edison, NP.  Consider - If  she would be a daliresp / azithro candidate given frequent admits but primarily concerned she is not compliant, consider pulmonary rehab, +/- palliative care input    All other medical problems per [facility]   Best practice:  Diet: advance as tolerated/ while alert Pain/Anxiety/Delirium protocol (if indicated): n/a VAP protocol (if indicated): n/a DVT prophylaxis: per primary  GI prophylaxis: per primary  Glucose control: per primary  Mobility: Progress as tolerated  Code Status: Full Code Family Communication: Patient updated on plan of care  Disposition:   Labs   CBC: Recent Labs  Lab 01/29/20 0515 01/29/20 0529 01/29/20 1116 01/30/20 0305  WBC 6.4  --   --  5.3  NEUTROABS 5.0  --   --   --   HGB 12.5 14.3 13.9 11.7*  HCT 42.8 42.0 41.0 38.9  MCV 106.5*  --   --  103.5*  PLT 159  --   --  149*    Basic Metabolic Panel: Recent Labs  Lab 01/29/20 0529 01/29/20 1116 01/30/20 0305  NA 136 139 141  K 5.3* 4.4 4.5  CL 91*  --  91*  CO2  --   --  39*  GLUCOSE 152*  --  116*  BUN 18  --  12  CREATININE 0.70  --  0.66  CALCIUM  --   --  9.5   GFR: CrCl cannot be calculated (Unknown ideal weight.). Recent Labs  Lab 01/29/20 0515 01/30/20 0305  WBC 6.4 5.3    Liver Function Tests: Recent Labs  Lab 01/29/20 0623  AST 25  ALT 17  ALKPHOS 46  BILITOT 0.6  PROT 7.2  ALBUMIN 3.9   No results for input(s): LIPASE, AMYLASE in the last 168 hours. No results for input(s): AMMONIA in the last 168 hours.  ABG    Component Value Date/Time   PHART 7.346 (L) 01/29/2020 1116   PCO2ART 85.7 (HH) 01/29/2020 1116   PO2ART 72.0 (L) 01/29/2020 1116   HCO3 46.9 (H) 01/29/2020 1116   TCO2 49 (H) 01/29/2020 1116   O2SAT 92.0 01/29/2020 1116     Coagulation Profile: No results for input(s): INR, PROTIME in the last 168 hours.  Cardiac Enzymes: No results for input(s): CKTOTAL, CKMB, CKMBINDEX, TROPONINI in the last 168 hours.  HbA1C: Hgb A1c MFr Bld  Date/Time Value  Ref Range Status  09/20/2018 02:26 PM 6.1 4.6 - 6.5 % Final    Comment:    Glycemic Control Guidelines for People with Diabetes:Non Diabetic:  <6%Goal of Therapy: <7%Additional Action Suggested:  >8%   03/29/2018 02:40 AM 5.7 (H) 4.8 - 5.6 % Final    Comment:    (NOTE) Pre diabetes:          5.7%-6.4% Diabetes:              >6.4% Glycemic control for   <7.0% adults with diabetes     CBG: No results  for input(s): GLUCAP in the last 168 hours.    Kennieth Rad, MSN, AGACNP-BC [facility] 01/30/2020, 9:34 AM    Please see [email or web address] for pager details.

## 2020-01-30 NOTE — Consult Note (Signed)
Consultation Note Date: 01/30/20  Patient Name: Jasmine [name]  DOB: 06-06-1946  MRN: [id number]  Age / Sex: 74 y.o., female  PCP: Jasmine Borg, MD Referring Physician: Donne Hazel, MD  Reason for Consultation: Establishing goals of care  HPI/Patient Profile: 74 y.o. female  with past medical history of COPD on home oxygen 3L and HS BiPAP, hypothyroidism, HTN, chronic diastolic CHF admitted on 0/99/8338 with dyspnea. Patient reports her BiPAP machine broke 2 days before admission. Per significant other, some question of non-compliance with BiPAP. Hospital admission for acute on chronic respiratory failure with COPD exacerbation. Receiving medical management. Palliative medicine consultation for goals of care.   Clinical Assessment and Goals of Care:  I have reviewed medical records, discussed with care team, and met with patient at bedside to discuss goals of care. No family at bedside. Patient awake, alert, oriented and able to participate in conversation. She does have slight, intermittent confusion when this NP at bedside.   I introduced Palliative Medicine as specialized medical care for people living with serious illness. It focuses on providing relief from the symptoms and stress of a serious illness. The goal is to improve quality of life for both the patient and the family.  We discussed a brief life review of the patient. Jasmine [name] lives with her first husband, Jasmine [name]. She shares that her second husband died in 2015/12/07 and she was reconnected with Jasmine [name] after this. He is great support to her. Prior to admission, patient able to complete ADL's including cooking and cleaning. Two supportive nieces, Jasmine [name] and Jasmine [name]. Wears 3L home oxygen and HS BiPAP.  Jasmine [name] reports that her BiPAP machine is broken, and with the pandemic, home health staff have not evaluated as frequently.   Discussed events leading up  to admission, course of hospitalization including diagnoses, interventions, plan of care. Discussed disease trajectory of COPD and recurrent admissions indicating disease progression. Discussed importance of wearing HS BiPAP and also BiPAP during naps.   Advanced directives, concepts specific to code status, and artifical feeding and hydration were discussed. Patient confirms that she has completed advanced directive/living will with Jasmine [name] as primary HCPOA and niece, Jasmine [name] as secondary HCPOA. Documentation not on file. Patient shares this documentation is in a lock box at the bank. After discharged, encouraged [place] to provide copies of this documentation to Jasmine [name], and healthcare providers, so her wishes are on file. Introduced and discussed MOST form. [place] would like to further review her living will prior to completing MOST form.   Questions and concerns were addressed.  Hard Choices booklet left for review. PMT contact information given. Patient gives this NP permission to call and discuss with significant other, Jasmine [name].    SUMMARY OF RECOMMENDATIONS    DNR, otherwise continue current plan of care and medical management.  Patient reports documented living will with S.O Jasmine [name]) as primary HCPOA and niece Jasmine [name]) as secondary HCPOA. Requested documentation of this, but patient reports living will is in a lock box at the bank. Encouraged patient to  provide copies to her family and healthcare providers. Encouraged ongoing goals of care discussions. Introduced and encouraged completion of MOST form.   TOC team to assist with home BiPAP to help prevent recurrent hospitalizations secondary to hypercarbic respiratory failure.  Outpatient pulmonology f/u 02/13/20 at 1430.   Continue outpatient palliative referral.   PMT will follow inpatient.  Code Status/Advance Care Planning:  DNR  Symptom Management:   Per attending  Palliative Prophylaxis:   Aspiration and Delirium  Protocol  Psycho-social/Spiritual:   Desire for further Chaplaincy support: yes  Additional Recommendations: Caregiving  Support/Resources, Compassionate Wean Education and Education on Hospice  Prognosis:   Poor long-term prognosis with acute on chronic hypercarbic respiratory failure secondary to COPD, recurrent hospitalization for COPD exacerbations  Discharge Planning: To Be Determined      Primary Diagnoses: Present on Admission: . Acute on chronic respiratory failure with hypercapnia (Jasmine [name]) . Chronic diastolic congestive heart failure ([place]) . Chronic low back pain . COPD with acute exacerbation ([place]) . Essential hypertension . Hyperglycemia . Hypothyroidism . Tobacco abuse disorder   I have reviewed the medical record, interviewed the patient and family, and examined the patient. The following aspects are pertinent.  Past Medical History:  Diagnosis Date  . ALLERGIC RHINITIS 05/01/2007  . Anxiety   . Arthritis    "hands" (12/28/2015)  . ASTHMA 05/01/2007   "since I was a child"  . Cancer (HCC)    LUNG  . CHF (congestive heart failure) ([place])   . Chronic bronchitis ([place])   . COPD (chronic obstructive pulmonary disease) ([place])   . DEPRESSION 07/21/2008  . DVT (deep venous thrombosis) (HCC) 1960s   LLE  . FATIGUE 10/10/2010  . HYPERTENSION 05/01/2007  . HYPOTHYROIDISM 08/23/2009   s/p RAI  . LUMBAR RADICULOPATHY, RIGHT 08/25/2008  . On home oxygen therapy    "3-4L; qd; all the time" (12/28/2015)  . OSTEOPOROSIS 05/01/2007  . SHOULDER PAIN, LEFT 08/23/2009  . SINUSITIS, CHRONIC 10/10/2010   Social History   Socioeconomic History  . Marital status: Significant Other    Spouse name: Not on file  . Number of children: 0  . Years of education: Not on file  . Highest education level: Not on file  Occupational History  . Occupation: RETIRED - Facilities manager: [employer]  Tobacco Use  . Smoking status: Former Smoker    Packs/day: 1.00     Years: 52.00    Pack years: 52.00    Types: Cigarettes    Start date: 1965    Quit date: 07/20/2016    Years since quitting: 3.5  . Smokeless tobacco: Never Used  Substance and Sexual Activity  . Alcohol use: Not Currently  . Drug use: Not Currently  . Sexual activity: Not Currently    Partners: Male    Birth control/protection: Post-menopausal  Other Topics Concern  . Not on file  Social History Narrative   Lives in [place], [place]. Lives with husband, no children, but husband has 3 kids. Ambulatory, without cane or walker. Smokes cigarettes, currently 1 ppd, smoked for >20-30 yrs. Drinks beer, ~2/day, occasionally liquor, but ? suspect minimizing. Occasional MJ and endorses crack cocaine occasionally   Social Determinants of Health   Financial Resource Strain:   . Difficulty of Paying Living Expenses:   Food Insecurity: No Food Insecurity  . Worried About Charity fundraiser in the Last Year: Never true  . Ran Out of Food in the Last Year: Never true  Transportation Needs: No Transportation Needs  . Lack of Transportation (Medical): No  . Lack of Transportation (Non-Medical): No  Physical Activity:   . Days of Exercise per Week:   . Minutes of Exercise per Session:   Stress:   . Feeling of Stress :   Social Connections: Unknown  . Frequency of Communication with Friends and Family: Not asked  . Frequency of Social Gatherings with Friends and Family: Not asked  . Attends Religious Services: Not asked  . Active Member of Clubs or Organizations: Not asked  . Attends Archivist Meetings: Not asked  . Marital Status: Not asked   Family History  Problem Relation Age of Onset  . Lung cancer Father   . Alcohol abuse Brother   . Diabetes Brother   . Hypertension Other   . Stroke Mother   . Thyroid disease Mother   . Asthma Other        maternal aunts  . Breast cancer Neg Hx    Scheduled Meds: . arformoterol  15 mcg Nebulization BID  . aspirin EC  81 mg Oral  Daily  . atorvastatin  10 mg Oral q1800  . budesonide (PULMICORT) nebulizer solution  0.5 mg Nebulization BID  . Chlorhexidine Gluconate Cloth  6 each Topical Daily  . citalopram  20 mg Oral Daily  . docusate sodium  100 mg Oral BID  . enoxaparin (LOVENOX) injection  40 mg Subcutaneous Q24H  . furosemide  40 mg Oral Daily  . ipratropium  0.5 mg Nebulization BID  . [START ON 01/31/2020] levothyroxine  50 mcg Oral Q0600  . methylPREDNISolone (SOLU-MEDROL) injection  40 mg Intravenous Q8H  . nicotine  14 mg Transdermal Daily  . sodium chloride flush  3 mL Intravenous Q12H   Continuous Infusions: . azithromycin 250 mg (01/30/20 1224)  . lactated ringers 50 mL/hr at 01/29/20 1510   PRN Meds:.acetaminophen **OR** acetaminophen, albuterol, bisacodyl, hydrALAZINE, morphine injection, ondansetron **OR** ondansetron (ZOFRAN) IV, polyethylene glycol, zolpidem Medications Prior to Admission:  Prior to Admission medications   Medication Sig Start Date End Date Taking? Authorizing Provider  albuterol (PROVENTIL) (2.5 MG/3ML) 0.083% nebulizer solution Take 3 mLs (2.5 mg total) by nebulization every 6 (six) hours as needed for wheezing or shortness of breath. 11/30/19  Yes Jasmine Borg, MD  albuterol (VENTOLIN HFA) 108 (90 Base) MCG/ACT inhaler INHALE 1 TO 2 PUFFS INTO THE LUNGS EVERY 6 (SIX) HOURS AS NEEDED FOR WHEEZING OR SHORTNESS OF BREATH. Patient taking differently: Inhale 2 puffs into the lungs every 6 (six) hours as needed for shortness of breath.  11/30/19  Yes Jasmine Borg, MD  aspirin EC 81 MG EC tablet Take 1 tablet (81 mg total) by mouth daily. 03/27/16  Yes Velvet Bathe, MD  atorvastatin (LIPITOR) 10 MG tablet TAKE ONE TABLET BY MOUTH IN THE EVENING Patient taking differently: Take 10 mg by mouth daily at 6 PM. TAKE ONE TABLET BY MOUTH IN THE EVENING 12/13/19  Yes Jasmine Borg, MD  citalopram (CELEXA) 20 MG tablet NEW PRESCRIPTION REQUEST: CITALOPRAM 20MG - TAKE ONE TABLET BY MOUTH  DAILY Patient taking differently: Take 20 mg by mouth daily.  11/30/19  Yes Jasmine Borg, MD  fluticasone furoate-vilanterol (BREO ELLIPTA) 200-25 MCG/INH AEPB Inhale 1 puff into the lungs daily. 11/30/19  Yes Jasmine Borg, MD  furosemide (LASIX) 40 MG tablet NEW PRESCRIPTION REQUEST: FUROSEMIDE 40MG - TAKE ONE TABLET BY MOUTH IN THE MORNING AND TAKE ONE TABLET BY MOUTH AS  NEEDED IN THE EVENING Patient taking differently: Take 40 mg by mouth 2 (two) times daily.  11/30/19  Yes Jasmine Borg, MD  levothyroxine (SYNTHROID) 75 MCG tablet NEW PRESCRIPTION REQUEST: LEVOTHYROXINE 75MCG - TAKE ONE TABLET BY MOUTH EARLY MORNING Patient taking differently: Take 75 mcg by mouth daily.  11/30/19  Yes Jasmine Borg, MD  potassium chloride (KLOR-CON) 10 MEQ tablet NEW PRESCRIPTION REQUEST: KLOR-CON 10MEQ - TAKE ONE TABLET BY MOUTH TWICE DAILY Patient taking differently: Take 10 mEq by mouth 2 (two) times daily.  11/30/19  Yes Jasmine Borg, MD  tiotropium (SPIRIVA HANDIHALER) 18 MCG inhalation capsule NEW PRESCRIPTION REQUEST: SPIRIVA 18MCG [place] OF THE CONTENTS OF ONE CAPSULE BY ONCE DAILY Patient taking differently: Place 18 mcg into inhaler and inhale daily.  11/30/19  Yes Jasmine Borg, MD  traMADol (ULTRAM) 50 MG tablet Take 1 tablet (50 mg total) by mouth 2 (two) times daily as needed. Patient taking differently: Take 50 mg by mouth 2 (two) times daily as needed for moderate pain.  11/30/19  Yes Jasmine Borg, MD  OXYGEN Inhale 3 L into the lungs. 24/7    [provider]   Allergies  Allergen Reactions  . Alendronate Sodium Other (See Comments)    Patient does not remember this reaction   Review of Systems  Constitutional: Positive for activity change.  Respiratory: Positive for shortness of breath.     Physical Exam Vitals and nursing note reviewed.  Constitutional:      General: She is awake.  HENT:     Head: Normocephalic and atraumatic.  Cardiovascular:      Rate and Rhythm: Normal rate.  Pulmonary:     Effort: No tachypnea, accessory muscle usage or respiratory distress.     Comments: Currently on 1L [place] Skin:    General: Skin is warm and dry.  Neurological:     Mental Status: She is alert and oriented to person, place, and time.     Comments: Periods of pleasant confusion  Psychiatric:        Mood and Affect: Mood normal.        Speech: Speech normal.        Behavior: Behavior normal.    Vital Signs: BP 121/64 (BP Location: Right Arm)   Pulse 95   Temp 98.5 F (36.9 C) (Oral)   Resp 15   SpO2 93%  Pain Scale: 0-10   Pain Score: 0-No pain   SpO2: SpO2: 93 % O2 Device:SpO2: 93 % O2 Flow Rate: .O2 Flow Rate (L/min): 1 L/min  IO: Intake/output summary:   Intake/Output Summary (Last 24 hours) at 01/30/2020 1605 Last data filed at 01/30/2020 0100 Gross per 24 hour  Intake 521.67 ml  Output 375 ml  Net 146.67 ml    LBM:   Baseline Weight:   Most recent weight:       Palliative Assessment/Data: PPS 40%     Time In: 1500 Time Out: 1610 Time Total: 79mn Greater than 50%  of this time was spent counseling and coordinating care related to the above assessment and plan.  Signed by:  MIhor Dow DNP, FNP-C Palliative Medicine Team  Phone: [phone]: [phone]  Please contact Palliative Medicine Team phone at [phone] questions and concerns.  For individual provider: See AShea Evans

## 2020-01-30 NOTE — Progress Notes (Signed)
Pt wanted to wash up standing and desated to the 70s during the process. When pt sat back down, I bumped her O2 up to 3L [place]. Patient slowly came back up to low 90s. Pt was turned back down once of O2 was back up.

## 2020-01-30 NOTE — Progress Notes (Signed)
PROGRESS NOTE    Jasmine [name]  [id number] DOB: May 10, 1946 DOA: 01/29/2020 PCP: Biagio Borg, MD    Brief Narrative:  74yo female with hx COPD on 3-4L home O2 and nocturnal bipap, chronic diastolic CHF presented with worsening sob and hypercarbic. Pt admitted for tx of COPD exacerbation  Assessment & Plan:   Principal Problem:   Acute on chronic respiratory failure with hypercapnia (HCC) Active Problems:   Essential hypertension   Tobacco abuse disorder   Chronic diastolic congestive heart failure (HCC)   Hyperglycemia   COPD with acute exacerbation (HCC)   Hypothyroidism   Chronic low back pain  Acute on chronic respiratory failure, likely associated with non-compliance, possibly also COPD exacerbation -Patient with periodic admissions for the same -She is supposed to be on 3L home O2 and nightly BIPAP -Family had reported that pt had been non-compliant with the BIPAP and this leads to confusion and then hospitalization -PCCM following -Improved overnight with BiPAP -Due to concern for possible COPD exacerbation, PCCM has started Azithromycin and Solumedrol -Continue Albuterol, Breo Ellipta, Spiriva -PCCM following, recommendation for continued IV solumedrol, bipap PRN, neb tx  Chronic diastolic CHF -Continue ASA -Follow I/o and daily wts -will reorder daily oral lasix per home regimen  HTN -She does not appear to be taking medication for this issue at this time -Seems stable  Hyperglycemia -May be stress response or steroid response -A1c of 5.9  HLD -Continue Lipitor as tolerated  Hypothyroidism -TSH 0.181 -Continue Synthroid, but will decrease to 31mcg -Would repeat TSH in 4-6 weeks  Chronic pain -Dr. Lorin Mercy reviewed this patient in the [place] Controlled Substances Reporting System.  She is receiving intermittent prescriptions for Tramadol from only one provider. -She is not at particularly high risk of opioid misuse, diversion, or  overdose.  Tobacco dependence -Encourage cessation.   -Patch ordered, stable  Goals of care -Patient is confirmed to be DNR -This is her 3rd admission in the last 6 months -She has a pattern of noncompliance and her husband is concerned for developing dementia -Palliative care consultation requested at time of presentation  DVT prophylaxis: Lovenox subq Code Status: DNR Family Communication: Pt in room, family not at bedside  Status is: Inpatient  Remains inpatient appropriate because:IV treatments appropriate due to intensity of illness or inability to take PO   Dispo: The patient is from: Home              Anticipated d/c is to: disposition pending PT/OT eval              Anticipated d/c date is: 3 days              Patient currently is not medically stable to d/c.        Consultants:   Pulmonary  Procedures:     Antimicrobials: Anti-infectives (From admission, onward)   Start     Dose/Rate Route Frequency Ordered Stop   01/30/20 1145  azithromycin (ZITHROMAX) 250 mg in dextrose 5 % 125 mL IVPB     250 mg 125 mL/hr over 60 Minutes Intravenous Every 24 hours 01/29/20 1059 02/03/20 1144   01/29/20 1115  azithromycin (ZITHROMAX) 500 mg in sodium chloride 0.9 % 250 mL IVPB     500 mg 250 mL/hr over 60 Minutes Intravenous  Once 01/29/20 1059 01/29/20 1408       Subjective: Reports feeling better today after using bipap  Objective: Vitals:   01/29/20 2308 01/30/20 0457 01/30/20 0734 01/30/20 1152  BP:  (!) 126/59 128/74 121/64  Pulse: 75 (!) 57 68 95  Resp: (!) 21 18 17 15   Temp:  98 F (36.7 C) 97.8 F (36.6 C) 98.5 F (36.9 C)  TempSrc:  Oral Oral Oral  SpO2: 91% 98% 96% 93%    Intake/Output Summary (Last 24 hours) at 01/30/2020 1541 Last data filed at 01/30/2020 0100 Gross per 24 hour  Intake 521.67 ml  Output 375 ml  Net 146.67 ml   There were no vitals filed for this visit.  Examination:  General exam: Appears calm and comfortable   Respiratory system: Clear to auscultation. Respiratory effort normal. Decreased BS Cardiovascular system: S1 & S2 heard, Regular Gastrointestinal system: Abdomen is nondistended, soft and nontender. No organomegaly or masses felt. Normal bowel sounds heard. Central nervous system: Alert and oriented. No focal neurological deficits. Extremities: Symmetric 5 x 5 power. Skin: No rashes, lesions  Psychiatry: Judgement and insight appear normal. Mood & affect appropriate.   Data Reviewed: I have personally reviewed following labs and imaging studies  CBC: Recent Labs  Lab 01/29/20 0515 01/29/20 0529 01/29/20 1116 01/30/20 0305  WBC 6.4  --   --  5.3  NEUTROABS 5.0  --   --   --   HGB 12.5 14.3 13.9 11.7*  HCT 42.8 42.0 41.0 38.9  MCV 106.5*  --   --  103.5*  PLT 159  --   --  123456*   Basic Metabolic Panel: Recent Labs  Lab 01/29/20 0529 01/29/20 1116 01/30/20 0305  NA 136 139 141  K 5.3* 4.4 4.5  CL 91*  --  91*  CO2  --   --  39*  GLUCOSE 152*  --  116*  BUN 18  --  12  CREATININE 0.70  --  0.66  CALCIUM  --   --  9.5   GFR: CrCl cannot be calculated (Unknown ideal weight.). Liver Function Tests: Recent Labs  Lab 01/29/20 0623  AST 25  ALT 17  ALKPHOS 46  BILITOT 0.6  PROT 7.2  ALBUMIN 3.9   No results for input(s): LIPASE, AMYLASE in the last 168 hours. No results for input(s): AMMONIA in the last 168 hours. Coagulation Profile: No results for input(s): INR, PROTIME in the last 168 hours. Cardiac Enzymes: No results for input(s): CKTOTAL, CKMB, CKMBINDEX, TROPONINI in the last 168 hours. BNP (last 3 results) No results for input(s): PROBNP in the last 8760 hours. HbA1C: Recent Labs    01/29/20 1755  HGBA1C 5.9*   CBG: No results for input(s): GLUCAP in the last 168 hours. Lipid Profile: No results for input(s): CHOL, HDL, LDLCALC, TRIG, CHOLHDL, LDLDIRECT in the last 72 hours. Thyroid Function Tests: Recent Labs    01/29/20 1755  TSH 0.181*    Anemia Panel: No results for input(s): VITAMINB12, FOLATE, FERRITIN, TIBC, IRON, RETICCTPCT in the last 72 hours. Sepsis Labs: No results for input(s): PROCALCITON, LATICACIDVEN in the last 168 hours.  Recent Results (from the past 240 hour(s))  Respiratory Panel by RT PCR (Flu A&B, Covid) - Nasopharyngeal Swab     Status: None   Collection Time: 01/29/20  6:11 AM   Specimen: Nasopharyngeal Swab  Result Value Ref Range Status   SARS Coronavirus 2 by RT PCR NEGATIVE NEGATIVE Final    Comment: (NOTE) SARS-CoV-2 target nucleic acids are NOT DETECTED. The SARS-CoV-2 RNA is generally detectable in upper respiratoy specimens during the acute phase of infection. The lowest concentration of SARS-CoV-2 viral copies this assay  can detect is 131 copies/mL. A negative result does not preclude SARS-Cov-2 infection and should not be used as the sole basis for treatment or other patient management decisions. A negative result may occur with  improper specimen collection/handling, submission of specimen other than nasopharyngeal swab, presence of viral mutation(s) within the areas targeted by this assay, and inadequate number of viral copies (<131 copies/mL). A negative result must be combined with clinical observations, patient history, and epidemiological information. The expected result is Negative. Fact Sheet for Patients:  PinkCheek.be Fact Sheet for Healthcare Providers:  GravelBags.it This test is not yet ap proved or cleared by the [place] FDA and  has been authorized for detection and/or diagnosis of SARS-CoV-2 by FDA under an Emergency Use Authorization (EUA). This EUA will remain  in effect (meaning this test can be used) for the duration of the COVID-19 declaration under Section 564(b)(1) of the Act, 21 U.S.C. section 360bbb-3(b)(1), unless the authorization is terminated or revoked sooner.    Influenza A by PCR  NEGATIVE NEGATIVE Final   Influenza B by PCR NEGATIVE NEGATIVE Final    Comment: (NOTE) The Xpert Xpress SARS-CoV-2/FLU/RSV assay is intended as an aid in  the diagnosis of influenza from Nasopharyngeal swab specimens and  should not be used as a sole basis for treatment. Nasal washings and  aspirates are unacceptable for Xpert Xpress SARS-CoV-2/FLU/RSV  testing. Fact Sheet for Patients: PinkCheek.be Fact Sheet for Healthcare Providers: GravelBags.it This test is not yet approved or cleared by the [place] FDA and  has been authorized for detection and/or diagnosis of SARS-CoV-2 by  FDA under an Emergency Use Authorization (EUA). This EUA will remain  in effect (meaning this test can be used) for the duration of the  Covid-19 declaration under Section 564(b)(1) of the Act, 21  U.S.C. section 360bbb-3(b)(1), unless the authorization is  terminated or revoked. Performed at [facility], [place] [address], [place], [place] [address]   MRSA PCR Screening     Status: None   Collection Time: 01/29/20  6:09 PM   Specimen: Nasal Mucosa; Nasopharyngeal  Result Value Ref Range Status   MRSA by PCR NEGATIVE NEGATIVE Final    Comment:        The GeneXpert MRSA Assay (FDA approved for NASAL specimens only), is one component of a comprehensive MRSA colonization surveillance program. It is not intended to diagnose MRSA infection nor to guide or monitor treatment for MRSA infections. Performed at [facility], [place] [address], [place], [place] [address]      Radiology Studies: DG Chest Portable 1 View  Result Date: 01/29/2020 CLINICAL DATA:  Initial evaluation for acute hypoxia. EXAM: PORTABLE CHEST 1 VIEW COMPARISON:  Prior radiograph from 12/04/2019. FINDINGS: Transverse heart size at the upper limits of normal. Mediastinal silhouette within normal limits. Aortic atherosclerosis. Lungs normally inflated. Oblique  linear density at the right upper lobe most likely reflects scarring, stable from previous. No focal infiltrates. No edema or definite pleural effusion. No pneumothorax. No acute osseous finding. IMPRESSION: 1. Stable appearance of the chest. No active cardiopulmonary disease. 2.  Aortic Atherosclerosis (ICD10-I70.0). Electronically Signed   By: Jeannine Boga M.D.   On: 01/29/2020 05:58    Scheduled Meds: . arformoterol  15 mcg Nebulization BID  . aspirin EC  81 mg Oral Daily  . atorvastatin  10 mg Oral q1800  . budesonide (PULMICORT) nebulizer solution  0.5 mg Nebulization BID  . Chlorhexidine Gluconate Cloth  6 each Topical  Daily  . citalopram  20 mg Oral Daily  . docusate sodium  100 mg Oral BID  . enoxaparin (LOVENOX) injection  40 mg Subcutaneous Q24H  . ipratropium  0.5 mg Nebulization BID  . levothyroxine  75 mcg Oral Q0600  . methylPREDNISolone (SOLU-MEDROL) injection  40 mg Intravenous Q8H  . nicotine  14 mg Transdermal Daily  . sodium chloride flush  3 mL Intravenous Q12H   Continuous Infusions: . azithromycin 250 mg (01/30/20 1224)  . lactated ringers 50 mL/hr at 01/29/20 1510     LOS: 1 day   Marylu Lund, MD [facility] Pager On Amion  If 7PM-7AM, please contact night-coverage 01/30/2020, 3:41 PM

## 2020-01-31 ENCOUNTER — Encounter (HOSPITAL_COMMUNITY): Payer: Self-pay | Admitting: Internal Medicine

## 2020-01-31 DIAGNOSIS — R4182 Altered mental status, unspecified: Secondary | ICD-10-CM | POA: Diagnosis not present

## 2020-01-31 DIAGNOSIS — I5032 Chronic diastolic (congestive) heart failure: Secondary | ICD-10-CM

## 2020-01-31 DIAGNOSIS — M545 Low back pain: Secondary | ICD-10-CM | POA: Diagnosis not present

## 2020-01-31 DIAGNOSIS — J441 Chronic obstructive pulmonary disease with (acute) exacerbation: Principal | ICD-10-CM

## 2020-01-31 DIAGNOSIS — J9622 Acute and chronic respiratory failure with hypercapnia: Secondary | ICD-10-CM | POA: Diagnosis not present

## 2020-01-31 MED ORDER — AZITHROMYCIN 250 MG PO TABS
250.0000 mg | ORAL_TABLET | Freq: Every day | ORAL | Status: AC
  Administered 2020-01-31 – 2020-02-02 (×3): 250 mg via ORAL
  Filled 2020-01-31 (×3): qty 1

## 2020-01-31 MED ORDER — PREDNISONE 20 MG PO TABS
40.0000 mg | ORAL_TABLET | Freq: Every day | ORAL | Status: DC
  Administered 2020-01-31 – 2020-02-03 (×4): 40 mg via ORAL
  Filled 2020-01-31 (×4): qty 2

## 2020-01-31 MED ORDER — PREDNISONE 20 MG PO TABS: 40.0000 mg | ORAL_TABLET | Freq: Every day | ORAL | Status: DC

## 2020-01-31 NOTE — Progress Notes (Signed)
Occupational Therapy Evaluation Patient Details Name: Jasmine [name] MRN: [id number] DOB: December 05, 1945 Today's Date: 01/31/2020    History of Present Illness 74yo female with hx COPD on 3-4L home O2 and nocturnal bipap, chronic diastolic CHF presented with worsening sob and hypercarbic. Pt admitted for tx of COPD exacerbation   Clinical Impression   PTA pt resided with her husband, independent in all ADL and mobility tasks. Pt does not ambulate with an assistive device and reports 0 falls in the last 6 months. Pt currently independent to min assist for self-care and mobility tasks. Pt tolerated sitting EOB ~5 min with supervision while engaging in LB dressing task. Pt able to ambulate to/from bathroom with RW and variable min guard to min assist. Noted several instances of LOB with pt requiring assist to self-correct. Pt completed toileting task x 2 requiring assist for balance in standing. Educated/instructed pt on safety strategies, fall prevention, activity pacing, and energy conservation with fair understanding and follow through. Pt appears mildly confused requiring repeat of instructions with multimodal cues. Pt initially on RA upon OT arrival with SpO2 83%. Respiratory therapist in with pt placed on 4L [place] and SpO2 maintaining in 90s throughout session. Pt demonstrates decreased strength, endurance, balance, standing tolerance, activity tolerance, and safety awareness impacting ability to complete self-care and functional transfer tasks. Recommend skilled OT services to address above deficits in order to promote function and prevent further decline. Recommend [facility] OT for continued rehab following hospital discharge.     Follow Up Recommendations  Home health OT;Supervision/Assistance - 24 hour    Equipment Recommendations  3 in 1 bedside commode(for use in shower)    Recommendations for Other Services       Precautions / Restrictions Precautions Precautions: Fall Precaution Comments: 4L  Reader Restrictions Weight Bearing Restrictions: No      Mobility Bed Mobility Overal bed mobility: Needs Assistance Bed Mobility: Supine to Sit     Supine to sit: Supervision     General bed mobility comments: To ensure balance and safety  Transfers Overall transfer level: Needs assistance Equipment used: Rolling walker (2 wheeled) Transfers: Sit to/from Stand Sit to Stand: Min guard         General transfer comment: Noted 0 instances of LOB    Balance Overall balance assessment: Needs assistance Sitting-balance support: Feet supported Sitting balance-Leahy Scale: Fair       Standing balance-Leahy Scale: Fair                             ADL either performed or assessed with clinical judgement   ADL Overall ADL's : Needs assistance/impaired Eating/Feeding: Independent;Sitting   Grooming: Wash/dry hands;Wash/dry face;Set up;Supervision/safety;Sitting   Upper Body Bathing: Set up;Supervision/ safety;Sitting   Lower Body Bathing: Minimal assistance;Sit to/from stand   Upper Body Dressing : Set up;Supervision/safety;Sitting   Lower Body Dressing: Minimal assistance;Sit to/from stand   Toilet Transfer: Nature conservation officer;Ambulation   Toileting- Clothing Manipulation and Hygiene: Minimal assistance;Sit to/from stand Toileting - Clothing Manipulation Details (indicate cue type and reason): to ensure balance and safety     Functional mobility during ADLs: Min guard;Minimal assistance;Rolling walker General ADL Comments: Pt able to ambulate to/from bathroom with RW and variable min guard to min assist. Noted several instances of LOB with pt requiring assist to self-correct.      Vision Baseline Vision/History: Wears glasses Wears Glasses: At all times       Perception  Praxis      Pertinent Vitals/Pain Pain Assessment: No/denies pain     Hand Dominance Right   Extremity/Trunk Assessment Upper Extremity Assessment Upper Extremity  Assessment: Generalized weakness   Lower Extremity Assessment Lower Extremity Assessment: Defer to PT evaluation       Communication Communication Communication: HOH   Cognition Arousal/Alertness: Awake/alert Behavior During Therapy: WFL for tasks assessed/performed Overall Cognitive Status: No family/caregiver present to determine baseline cognitive functioning                                 General Comments: A&O x 4. Pleasant and willing to participate in therapy. Mild confusion requiring mod repeat of instructions with multimodal cues.    General Comments  Pt on 4L [place] with SpO2 maintaining in 90s throughout.     Exercises     Shoulder Instructions      Home Living Family/patient expects to be discharged to:: Private residence Living Arrangements: Spouse/significant other Available Help at Discharge: Family;Available 24 hours/day Type of Home: House Home Access: Stairs to enter [employer] of Steps: 3   Home Layout: One level     Bathroom Shower/Tub: Occupational psychologist: Standard     Home Equipment: Environmental consultant - 4 wheels;Walker - 2 wheels;Grab bars - tub/shower          Prior Functioning/Environment Level of Independence: Independent        Comments: Pt independent with ADLs and mobility. Pt reports ambulating without an assistive device. Pt reports 0 falls in the last 6 months. Pt does not drive. Pt's husband assists with IADLs.         OT Problem List: Decreased strength;Decreased activity tolerance;Impaired balance (sitting and/or standing);Decreased safety awareness;Decreased cognition;Cardiopulmonary status limiting activity      OT Treatment/Interventions: Self-care/ADL training;Therapeutic exercise;Neuromuscular education;Energy conservation;DME and/or AE instruction;Therapeutic activities;Patient/family education;Balance training    OT Goals(Current goals can be found in the care plan section) Acute Rehab OT  Goals Patient Stated Goal: to go home Time For Goal Achievement: 02/14/20 Potential to Achieve Goals: Good ADL Goals Pt Will Perform Tub/Shower Transfer: with supervision;ambulating;Shower transfer;grab bars Additional ADL Goal #1: Pt to complete all ADLs with modified independence and 0 instances of LOB. Additional ADL Goal #2: Pt to tolerate standing up to 10 minutes with modified independence, in preparation for ADLs. Additional ADL Goal #3: Pt to recall and verbalize 3 fall prevention strategies with 0 verbal cues. Additional ADL Goal #4: Pt to recall and verbalize 3 energy conservation strategies with 0 verbal cues.  OT Frequency: Min 3X/week   Barriers to D/C:            Co-evaluation              AM-PAC OT "6 Clicks" Daily Activity     Outcome Measure Help from another person eating meals?: None Help from another person taking care of personal grooming?: A Little Help from another person toileting, which includes using toliet, bedpan, or urinal?: A Little Help from another person bathing (including washing, rinsing, drying)?: A Little Help from another person to put on and taking off regular upper body clothing?: A Little Help from another person to put on and taking off regular lower body clothing?: A Little 6 Click Score: 19   End of Session Equipment Utilized During Treatment: Gait belt;Rolling walker;Oxygen Nurse Communication: Mobility status  Activity Tolerance: Patient tolerated treatment well Patient left: in chair;with call  bell/phone within reach;with chair alarm set  OT Visit Diagnosis: Unsteadiness on feet (R26.81);Muscle weakness (generalized) (M62.81)                Time: [id number] OT Time Calculation (min): 45 min Charges:  OT General Charges $OT Visit: 1 Visit OT Evaluation $OT Eval Low Complexity: 1 Low OT Treatments $Self Care/Home Management : 8-22 mins $Therapeutic Activity: 8-22 mins  Mauri Brooklyn OTR/L [phone]  Mauri Brooklyn 01/31/2020, 8:58 AM

## 2020-01-31 NOTE — Evaluation (Signed)
Physical Therapy Evaluation Patient Details Name: Jasmine [name] MRN: [id number] DOB: 1946-09-22 Today's Date: 01/31/2020   History of Present Illness  74yo female with hx COPD on 3-4L home O2 and nocturnal bipap, chronic diastolic CHF presented with worsening sob and hypercarbic. Pt admitted for tx of COPD exacerbation  Clinical Impression   Pt presents with generalized weakness, dyspnea on exertion requiring cuing to rest, increased time and effort to mobilize, and fair standing balance requiring use of RW for ambulation. Pt to benefit from acute PT to address deficits. Pt ambulated hallway distance with RW with close guard for safety, pt with decreased safety awareness and energy conservation practices at this time. PT recommending HHPT to address deficits. PT to progress mobility as tolerated, and will continue to follow acutely.      Follow Up Recommendations Home health PT;Supervision/Assistance - 24 hour    Equipment Recommendations  None recommended by PT    Recommendations for Other Services       Precautions / Restrictions Precautions Precautions: Fall Precaution Comments: 4L [place] Restrictions Weight Bearing Restrictions: No      Mobility  Bed Mobility Overal bed mobility: Needs Assistance             General bed mobility comments: pt up in chair upon PT arrival, requests stay in chair upon PT exit.  Transfers Overall transfer level: Needs assistance Equipment used: Rolling walker (2 wheeled) Transfers: Sit to/from Stand Sit to Stand: Min guard         General transfer comment: Min guard for safety, verbal cuing for safe hand placement when rising/sitting.  Ambulation/Gait Ambulation/Gait assistance: Min guard Gait Distance (Feet): 175 Feet Assistive device: Rolling walker (2 wheeled) Gait Pattern/deviations: Step-through pattern;Decreased stride length;Trunk flexed Gait velocity: decr   General Gait Details: min guard for safety, verbal cuing for  placement in RW, upright posture, taking rest breaks as needed to recover fatigue. DOE 2/4 during ambulation, SpO2 90% and above on 3LO2.  Stairs            Wheelchair Mobility    Modified Rankin (Stroke Patients Only)       Balance Overall balance assessment: Needs assistance Sitting-balance support: Feet supported Sitting balance-Leahy Scale: Fair       Standing balance-Leahy Scale: Fair                               Pertinent Vitals/Pain Pain Assessment: No/denies pain    Home Living Family/patient expects to be discharged to:: Private residence Living Arrangements: Spouse/significant other Available Help at Discharge: Family;Available 24 hours/day Type of Home: House Home Access: Stairs to enter   [employer] of Steps: 3 Home Layout: One level Home Equipment: Walker - 4 wheels;Walker - 2 wheels;Grab bars - tub/shower      Prior Function Level of Independence: Independent         Comments: Pt independent with ADLs and mobility. Pt reports ambulating without an assistive device in the home, uses RW for grocery shopping. Pt reports 0 falls in the last 6 months. Pt does not drive. Pt's husband assists with IADLs.     Hand Dominance   Dominant Hand: Right    Extremity/Trunk Assessment   Upper Extremity Assessment Upper Extremity Assessment: Defer to OT evaluation    Lower Extremity Assessment Lower Extremity Assessment: Generalized weakness    Cervical / Trunk Assessment Cervical / Trunk Assessment: Normal  Communication   Communication: [facility]  Cognition Arousal/Alertness: Awake/alert Behavior During Therapy: WFL for tasks assessed/performed Overall Cognitive Status: No family/caregiver present to determine baseline cognitive functioning                                 General Comments: A&Ox4, requires repeated cuing at times for safe mobility. Pt very cheerful during PT eval.      General Comments General  comments (skin integrity, edema, etc.): on 3LO2 during ambulation, SpO2 90% and greater    Exercises     Assessment/Plan    PT Assessment Patient needs continued PT services  PT Problem List Decreased strength;Decreased mobility;Decreased activity tolerance;Decreased balance;Decreased knowledge of use of DME;Pain;Decreased safety awareness       PT Treatment Interventions DME instruction;Therapeutic activities;Gait training;Therapeutic exercise;Patient/family education;Balance training;Stair training;Functional mobility training;Neuromuscular re-education    PT Goals (Current goals can be found in the Care Plan section)  Acute Rehab PT Goals Patient Stated Goal: to go home PT Goal Formulation: With patient Time For Goal Achievement: 02/14/20 Potential to Achieve Goals: Good    Frequency Min 3X/week   Barriers to discharge        Co-evaluation               AM-PAC PT "6 Clicks" Mobility  Outcome Measure Help needed turning from your back to your side while in a flat bed without using bedrails?: A Little Help needed moving from lying on your back to sitting on the side of a flat bed without using bedrails?: A Little Help needed moving to and from a bed to a chair (including a wheelchair)?: A Little Help needed standing up from a chair using your arms (e.g., wheelchair or bedside chair)?: A Little Help needed to walk in hospital room?: A Little Help needed climbing 3-5 steps with a railing? : A Little 6 Click Score: 18    End of Session Equipment Utilized During Treatment: Gait belt;Oxygen Activity Tolerance: Patient tolerated treatment well;Patient limited by fatigue Patient left: in chair;with chair alarm set;with call bell/phone within reach Nurse Communication: Mobility status PT Visit Diagnosis: Other abnormalities of gait and mobility (R26.89);Muscle weakness (generalized) (M62.81)    Time: [id number] PT Time Calculation (min) (ACUTE ONLY): 24 min   Charges:    PT Evaluation $PT Eval Low Complexity: 1 Low PT Treatments $Gait Training: 8-22 mins       Zeya Balles E, PT Acute Rehabilitation Services Pager [phone]  Office [phone]   Bernyce Brimley D Elonda Husky 01/31/2020, 3:56 PM

## 2020-01-31 NOTE — Progress Notes (Signed)
PROGRESS NOTE    Jasmine [name]  [id number] DOB: 29-May-1946 DOA: 01/29/2020 PCP: Biagio Borg, MD    Brief Narrative:  74yo female with hx COPD on 3-4L home O2 and nocturnal bipap, chronic diastolic CHF presented with worsening sob and hypercarbic. Pt admitted for tx of COPD exacerbation  Assessment & Plan:   Principal Problem:   Acute on chronic respiratory failure with hypercapnia (HCC) Active Problems:   Essential hypertension   Tobacco abuse disorder   Chronic diastolic congestive heart failure (HCC)   Hyperglycemia   COPD with acute exacerbation (HCC)   Hypothyroidism   Chronic low back pain   Goals of care, counseling/discussion  Acute on chronic respiratory failure, likely associated with non-compliance, possibly also COPD exacerbation -Patient with periodic admissions for the same -She is supposed to be on 3L home O2 and nightly BIPAP -Family had reported that pt had been non-compliant with the BIPAP and this leads to confusion and then hospitalization. On further review, pt had noted BiPAP is broken -Discussed with SW who has alerted rep for BiPAP to repair device -PCCM had been following. Weaning oral steroids. Recommendation to complete 5 day course of azithromycin -Overall improved with BiPAP -Continue Albuterol, Breo Ellipta, Spiriva  Chronic diastolic CHF -Continue ASA -Follow I/o and daily wts -cont home lasix regimen  HTN -She does not appear to be taking medication for this issue at this time -Seems stable at this time  Hyperglycemia -May be stress response or steroid response -A1c noted to be 5.9  HLD -Continue Lipitor as pt tolerates  Hypothyroidism -TSH low at 0.181 -Continue Synthroid, but have decreased to 33mcg -Would repeat TSH in 4-6 weeks  Chronic pain -Dr. Lorin Mercy reviewed this patient in the  Controlled Substances Reporting System.  She is receiving intermittent prescriptions for Tramadol from only one provider. -She is  not at particularly high risk of opioid misuse, diversion, or overdose.  Tobacco dependence -Encourage cessation.   -Nicotine patch ordered, stable  Goals of care -Patient is confirmed to be DNR -This is her 3rd admission in the last 6 months -She has a pattern of noncompliance and her husband is concerned for developing dementia -Palliative care consultation requested at time of presentation  DVT prophylaxis: Lovenox subq Code Status: DNR Family Communication: Pt in room, family not at bedside  Status is: Inpatient  Remains inpatient appropriate because:Unsafe d/c plan   Dispo: The patient is from: Home              Anticipated d/c is to: Home pending repair of home BiPAP              Anticipated d/c date is: 1 day              Patient currently is not medically stable to d/c.        Consultants:   Pulmonary  Procedures:     Antimicrobials: Anti-infectives (From admission, onward)   Start     Dose/Rate Route Frequency Ordered Stop   01/31/20 1200  azithromycin (ZITHROMAX) tablet 250 mg     250 mg Oral Daily 01/31/20 1132 02/03/20 0959   01/30/20 1145  azithromycin (ZITHROMAX) 250 mg in dextrose 5 % 125 mL IVPB  Status:  Discontinued     250 mg 125 mL/hr over 60 Minutes Intravenous Every 24 hours 01/29/20 1059 01/31/20 1132   01/29/20 1115  azithromycin (ZITHROMAX) 500 mg in sodium chloride 0.9 % 250 mL IVPB     500 mg 250  mL/hr over 60 Minutes Intravenous  Once 01/29/20 1059 01/29/20 1408      Subjective: States feeling better. Seen this AM ambulating with PT in hallway  Objective: Vitals:   01/31/20 0140 01/31/20 0317 01/31/20 0720 01/31/20 0749  BP: 124/77 137/80  (!) 154/76  Pulse: 64   66  Resp: 15   18  Temp: 97.8 F (36.6 C) 97.8 F (36.6 C) 97.6 F (36.4 C) 97.6 F (36.4 C)  TempSrc: Oral Axillary Oral Oral  SpO2: 97%   (!) 85%  Weight:  53.2 kg      Intake/Output Summary (Last 24 hours) at 01/31/2020 1609 Last data filed at 01/31/2020  1444 Gross per 24 hour  Intake 1440 ml  Output 3000 ml  Net -1560 ml   Filed Weights   01/31/20 0317  Weight: 53.2 kg    Examination: General exam: Awake,sitting in chair, in nad Respiratory system: Normal respiratory effort, no wheezing Cardiovascular system: regular rate, s1, s2 Gastrointestinal system: Soft, nondistended, positive BS Central nervous system: CN2-12 grossly intact, strength intact Extremities: Perfused, no clubbing Skin: Normal skin turgor, no notable skin lesions seen Psychiatry: Mood normal // no visual hallucinations   Data Reviewed: I have personally reviewed following labs and imaging studies  CBC: Recent Labs  Lab 01/29/20 0515 01/29/20 0529 01/29/20 1116 01/30/20 0305  WBC 6.4  --   --  5.3  NEUTROABS 5.0  --   --   --   HGB 12.5 14.3 13.9 11.7*  HCT 42.8 42.0 41.0 38.9  MCV 106.5*  --   --  103.5*  PLT 159  --   --  123456*   Basic Metabolic Panel: Recent Labs  Lab 01/29/20 0529 01/29/20 1116 01/30/20 0305  NA 136 139 141  K 5.3* 4.4 4.5  CL 91*  --  91*  CO2  --   --  39*  GLUCOSE 152*  --  116*  BUN 18  --  12  CREATININE 0.70  --  0.66  CALCIUM  --   --  9.5   GFR: Estimated Creatinine Clearance: 43.6 mL/min (by C-G formula based on SCr of 0.66 mg/dL). Liver Function Tests: Recent Labs  Lab 01/29/20 0623  AST 25  ALT 17  ALKPHOS 46  BILITOT 0.6  PROT 7.2  ALBUMIN 3.9   No results for input(s): LIPASE, AMYLASE in the last 168 hours. No results for input(s): AMMONIA in the last 168 hours. Coagulation Profile: No results for input(s): INR, PROTIME in the last 168 hours. Cardiac Enzymes: No results for input(s): CKTOTAL, CKMB, CKMBINDEX, TROPONINI in the last 168 hours. BNP (last 3 results) No results for input(s): PROBNP in the last 8760 hours. HbA1C: Recent Labs    01/29/20 1755  HGBA1C 5.9*   CBG: No results for input(s): GLUCAP in the last 168 hours. Lipid Profile: No results for input(s): CHOL, HDL, LDLCALC,  TRIG, CHOLHDL, LDLDIRECT in the last 72 hours. Thyroid Function Tests: Recent Labs    01/29/20 1755  TSH 0.181*   Anemia Panel: No results for input(s): VITAMINB12, FOLATE, FERRITIN, TIBC, IRON, RETICCTPCT in the last 72 hours. Sepsis Labs: No results for input(s): PROCALCITON, LATICACIDVEN in the last 168 hours.  Recent Results (from the past 240 hour(s))  Respiratory Panel by RT PCR (Flu A&B, Covid) - Nasopharyngeal Swab     Status: None   Collection Time: 01/29/20  6:11 AM   Specimen: Nasopharyngeal Swab  Result Value Ref Range Status   SARS Coronavirus 2  by RT PCR NEGATIVE NEGATIVE Final    Comment: (NOTE) SARS-CoV-2 target nucleic acids are NOT DETECTED. The SARS-CoV-2 RNA is generally detectable in upper respiratoy specimens during the acute phase of infection. The lowest concentration of SARS-CoV-2 viral copies this assay can detect is 131 copies/mL. A negative result does not preclude SARS-Cov-2 infection and should not be used as the sole basis for treatment or other patient management decisions. A negative result may occur with  improper specimen collection/handling, submission of specimen other than nasopharyngeal swab, presence of viral mutation(s) within the areas targeted by this assay, and inadequate number of viral copies (<131 copies/mL). A negative result must be combined with clinical observations, patient history, and epidemiological information. The expected result is Negative. Fact Sheet for Patients:  PinkCheek.be Fact Sheet for Healthcare Providers:  GravelBags.it This test is not yet ap proved or cleared by the [place] FDA and  has been authorized for detection and/or diagnosis of SARS-CoV-2 by FDA under an Emergency Use Authorization (EUA). This EUA will remain  in effect (meaning this test can be used) for the duration of the COVID-19 declaration under Section 564(b)(1) of the Act, 21  U.S.C. section 360bbb-3(b)(1), unless the authorization is terminated or revoked sooner.    Influenza A by PCR NEGATIVE NEGATIVE Final   Influenza B by PCR NEGATIVE NEGATIVE Final    Comment: (NOTE) The Xpert Xpress SARS-CoV-2/FLU/RSV assay is intended as an aid in  the diagnosis of influenza from Nasopharyngeal swab specimens and  should not be used as a sole basis for treatment. Nasal washings and  aspirates are unacceptable for Xpert Xpress SARS-CoV-2/FLU/RSV  testing. Fact Sheet for Patients: PinkCheek.be Fact Sheet for Healthcare Providers: GravelBags.it This test is not yet approved or cleared by the [place] FDA and  has been authorized for detection and/or diagnosis of SARS-CoV-2 by  FDA under an Emergency Use Authorization (EUA). This EUA will remain  in effect (meaning this test can be used) for the duration of the  Covid-19 declaration under Section 564(b)(1) of the Act, 21  U.S.C. section 360bbb-3(b)(1), unless the authorization is  terminated or revoked. Performed at [facility], [place] [address], [place], [place] [address]   MRSA PCR Screening     Status: None   Collection Time: 01/29/20  6:09 PM   Specimen: Nasal Mucosa; Nasopharyngeal  Result Value Ref Range Status   MRSA by PCR NEGATIVE NEGATIVE Final    Comment:        The GeneXpert MRSA Assay (FDA approved for NASAL specimens only), is one component of a comprehensive MRSA colonization surveillance program. It is not intended to diagnose MRSA infection nor to guide or monitor treatment for MRSA infections. Performed at [facility], [place] [address] [place], [place] [address]      Radiology Studies: No results found.  Scheduled Meds: . arformoterol  15 mcg Nebulization BID  . aspirin EC  81 mg Oral Daily  . atorvastatin  10 mg Oral q1800  . azithromycin  250 mg Oral Daily  . budesonide (PULMICORT) nebulizer solution  0.5 mg  Nebulization BID  . Chlorhexidine Gluconate Cloth  6 each Topical Daily  . citalopram  20 mg Oral Daily  . docusate sodium  100 mg Oral BID  . enoxaparin (LOVENOX) injection  40 mg Subcutaneous Q24H  . furosemide  40 mg Oral Daily  . ipratropium  0.5 mg Nebulization BID  . levothyroxine  50 mcg Oral Q0600  . nicotine  14 mg Transdermal Daily  . predniSONE  40 mg Oral Q breakfast  . sodium chloride flush  3 mL Intravenous Q12H   Continuous Infusions: . lactated ringers 50 mL/hr at 01/29/20 1510     LOS: 2 days   Marylu Lund, MD [facility] Pager On Amion  If 7PM-7AM, please contact night-coverage 01/31/2020, 4:09 PM

## 2020-01-31 NOTE — Clinical Social Work Note (Addendum)
4:24pm Respiratory therapist will come by to check the bipap machine tomorrow.    4:03pm Rep from [employer] will be speaking with patient to attempt to get significant other to bring machine to the hospital for service tomorrow.    CSW made call to [employer] to set up and appointment to get patient's bipap fixed. CSW spoke with Thedore Mins who states he will have someone reach out to her as soon as possible to set up a time to service her machine.

## 2020-01-31 NOTE — Progress Notes (Signed)
PHARMACIST - PHYSICIAN COMMUNICATION DR:   Chiu  CONCERNING: Antibiotic IV to Oral Route Change Policy  RECOMMENDATION: This patient is receiving azithromycin by the intravenous route.  Based on criteria approved by the Pharmacy and Therapeutics Committee, the antibiotic(s) is/are being converted to the equivalent oral dose form(s).   DESCRIPTION: These criteria include:  Patient being treated for a respiratory tract infection, urinary tract infection, cellulitis or clostridium difficile associated diarrhea if on metronidazole  The patient is not neutropenic and does not exhibit a GI malabsorption state  The patient is eating (either orally or via tube) and/or has been taking other orally administered medications for a least 24 hours  The patient is improving clinically and has a Tmax < 100.5  If you have questions about this conversion, please contact the Pharmacy Department  []  ( [phone] )  [place] []  ( [phone] )  [facility] [x]  ( [phone] )  [place] []  ( [phone]  [facility] []  ( [phone] )  [facility]   

## 2020-01-31 NOTE — Progress Notes (Signed)
NAME:  Jasmine [name], MRN:  [id number], DOB:  [birth year], LOS: 2 ADMISSION DATE:  01/29/2020, CONSULTATION DATE:  01/29/20 REFERRING MD:  Dr. Randal Buba, CHIEF COMPLAINT:  Dyspnea   Brief History   74 y/o F former smoker (Quit 2017 with a 52 pack year smoking history) admitted 4/11 with complaints of dyspnea. Work up consistent with acute on chronic hypercarbic respiratory failure in setting of broken home CPAP machine.   History of present illness   74 y/o F who presented to [facility] on 4/11 with reports of dyspnea.    The patient reports she lives with her ex-husband.  She indicates her home CPAP machine has been broken and unable to wear.  Machine is reportedly 3-63 years old.  She is followed by [facility].  Chart review shows she was admitted from 2/14 - 2/17 for acute on chronic hypoxic / hypercarbic respiratory failure in the setting of failure to use BiPAP due to power loss during bad weather.  She was treated with steroids and nebulized bronchodilators with improvement.   She presented 4/11 by EMS from home with reports of a broken BiPAP machine.  The machine reportedly stopped working two days prior to admit.  EMS made attempts to fix the machine but were unable.  Her room air saturations were 54% (she was discharged on 4L during last admit). She denied infectious symptoms but reported she was fatigued.  Initial work up found her to be hypercarbic with a pCO2 of 107 (baseline in the 80's).  Labs otherwise were unremarkable. COVID / Influenza negative.  CXR without acute disease.    PCCM called for evaluation of acute on chronic hypercarbic / hypoxic respiratory failure.   Past Medical History  Lung Cancer  COPD  Chronic Hypoxic Hypercarbic Respiratory Failure - 3-4L O2 dependent + QHS BIPAP Tobacco Abuse  Hypothyroidism  HTN HLD Chronic Diastolic CHF   Significant Hospital Events   4/11 Admit with hypercarbia  Consults:  PCCM  Procedures:    Significant Diagnostic Tests:   CXR 4/11  >> no active disease  Micro Data:  COVID PCR 4/11 >> negative  Influenza A/B 4/11 >> negative   Antimicrobials:     Interim history/subjective:  No issues overnight.  Wore BiPAP all night, now on2 L [place] Awake, very talkative, no complaints, sitting in chair and on phone ordering meals. Has not required daytime BiPAP.  Objective   Blood pressure (!) 154/76, pulse 66, temperature 97.6 F (36.4 C), temperature source Oral, resp. rate 18, weight 53.2 kg, SpO2 (!) 85 %.        Intake/Output Summary (Last 24 hours) at 01/31/2020 1023 Last data filed at 01/31/2020 0100 Gross per 24 hour  Intake 1440 ml  Output 1400 ml  Net 40 ml   Filed Weights   01/31/20 0317  Weight: 53.2 kg    Examination: General:  Elderly female , OOB in chair, In NAD HEENT: NCAT, MM pink/moist, No LAD Neuro: Awake, alert, oriented x 3, MAE x 4 CV: S1, S2, rr, no murmur, rub or gallop PULM: Bilateral chest excursion,  Non labored, speaking full sentences without difficulty and sats lowest to 92% while talking on 2 L [place] (normally on 3L) otherwise, saturations > 94, diminished throughout, some air movement, no wheezes GI: soft, bsx4 active, foley  Extremities: warm/dry, no LE edema  Skin: no rashes, no lesions, warm and dry   Resolved Hospital Problem list      Assessment & Plan:   Acute on Chronic  Hypoxic / Hypercarbic Respiratory Failure  BiPAP machine reportedly broken but she was found with room air saturations of 54% which raises the question of compliance.  Baseline 3-4L O2 dependent.  P: Continue BiPAP prn daytime sleeping and q HS without fail Titrate  supplemental O2 for sat goal 88-95% Continue brovana and Pulmicort, prn albuterol, atrovent BID, and incruse  Will stop Solumedrol and initate pred taper as transition for discharge. Will start first dose of 40 mg 4/14  Prednisone taper; 10 mg tablets: 4 tabs x 2 days, 3 tabs x 2 days, 2 tabs x 2 days 1 tab x 2 days then stop. Caution with any  sedating meds Diet ordered Continue ongoing pulmonary toilet, mobilize Will need to ensure her home BiPAP is working/ fixed prior to discharge Consult case management for discharge planning     COPD, r/o Acute Exacerbation vs non-compliance P:  Nebs as above Steroids as above  Continue azithromax - 5 day course  Follow up pulmonary appt set up for April 26 at 230 pm with Rexene Edison, NP.  Consider - If she would be a daliresp / azithro candidate given frequent admits but primarily concerned she is not compliant, consider pulmonary rehab, +/- palliative care input   PCCM will sign off . OP follow up has been made in Pulmonary Office 4/26.   All other medical problems per [facility]   Best practice:  Diet: advance as tolerated/ while alert Pain/Anxiety/Delirium protocol (if indicated): n/a VAP protocol (if indicated): n/a DVT prophylaxis: per primary  GI prophylaxis: per primary  Glucose control: per primary  Mobility: Progress as tolerated  Code Status: Full Code Family Communication: Patient updated on plan of care  Disposition:   Labs   CBC: Recent Labs  Lab 01/29/20 0515 01/29/20 0529 01/29/20 1116 01/30/20 0305  WBC 6.4  --   --  5.3  NEUTROABS 5.0  --   --   --   HGB 12.5 14.3 13.9 11.7*  HCT 42.8 42.0 41.0 38.9  MCV 106.5*  --   --  103.5*  PLT 159  --   --  149*    Basic Metabolic Panel: Recent Labs  Lab 01/29/20 0529 01/29/20 1116 01/30/20 0305  NA 136 139 141  K 5.3* 4.4 4.5  CL 91*  --  91*  CO2  --   --  39*  GLUCOSE 152*  --  116*  BUN 18  --  12  CREATININE 0.70  --  0.66  CALCIUM  --   --  9.5   GFR: Estimated Creatinine Clearance: 43.6 mL/min (by C-G formula based on SCr of 0.66 mg/dL). Recent Labs  Lab 01/29/20 0515 01/30/20 0305  WBC 6.4 5.3    Liver Function Tests: Recent Labs  Lab 01/29/20 0623  AST 25  ALT 17  ALKPHOS 46  BILITOT 0.6  PROT 7.2  ALBUMIN 3.9   No results for input(s): LIPASE, AMYLASE in the last 168  hours. No results for input(s): AMMONIA in the last 168 hours.  ABG    Component Value Date/Time   PHART 7.346 (L) 01/29/2020 1116   PCO2ART 85.7 (HH) 01/29/2020 1116   PO2ART 72.0 (L) 01/29/2020 1116   HCO3 46.9 (H) 01/29/2020 1116   TCO2 49 (H) 01/29/2020 1116   O2SAT 92.0 01/29/2020 1116     Coagulation Profile: No results for input(s): INR, PROTIME in the last 168 hours.  Cardiac Enzymes: No results for input(s): CKTOTAL, CKMB, CKMBINDEX, TROPONINI in the last 168  hours.  HbA1C: Hgb A1c MFr Bld  Date/Time Value Ref Range Status  01/29/2020 05:55 PM 5.9 (H) 4.8 - 5.6 % Final    Comment:    (NOTE)         Prediabetes: 5.7 - 6.4         Diabetes: >6.4         Glycemic control for adults with diabetes: <7.0   09/20/2018 02:26 PM 6.1 4.6 - 6.5 % Final    Comment:    Glycemic Control Guidelines for People with Diabetes:Non Diabetic:  <6%Goal of Therapy: <7%Additional Action Suggested:  >8%     CBG: No results for input(s): GLUCAP in the last 168 hours.   Magdalen Spatz, MSN, AGACNP-BC [place] Pager # [phone] After 4 pm please call [phone] 01/31/2020, 10:23 AM    Please see [email or web address] for pager details.

## 2020-02-01 DIAGNOSIS — E039 Hypothyroidism, unspecified: Secondary | ICD-10-CM

## 2020-02-01 DIAGNOSIS — J9622 Acute and chronic respiratory failure with hypercapnia: Secondary | ICD-10-CM | POA: Diagnosis not present

## 2020-02-01 DIAGNOSIS — I10 Essential (primary) hypertension: Secondary | ICD-10-CM

## 2020-02-01 DIAGNOSIS — R4182 Altered mental status, unspecified: Secondary | ICD-10-CM | POA: Diagnosis not present

## 2020-02-01 DIAGNOSIS — J441 Chronic obstructive pulmonary disease with (acute) exacerbation: Secondary | ICD-10-CM | POA: Diagnosis not present

## 2020-02-01 NOTE — Progress Notes (Signed)
PROGRESS NOTE  Jasmine [name] [id number] DOB: February 16, 1946 DOA: 01/29/2020 PCP: Biagio Borg, MD   LOS: 3 days   Brief Narrative / Interim history: 74 year old female with history of COPD on 3 4 L home O2, nocturnal BiPAP with concerns for nonadherence but also with concern about her home BiPAP not working properly, chronic diastolic CHF, came into the hospital with worsening dyspnea found to have significant CO2 retention requiring BiPAP.  Subjective / 24h Interval events: She is doing better this morning, feels back to baseline.  She is awaiting company representative to evaluate her home BiPAP which is currently in the room  Assessment & Plan: Principal Problem Acute on chronic hypoxic and hypercarbic respiratory failure, COPD exacerbation, possibly a component of nonadherence-patient was admitted to the hospital and placed on NIPPV.  Patient reports compliance with the BiPAP but sometimes she falls asleep while watching TV and realizes that machine is not on.  Per prior notes, family had reported that she has been noncompliant which generally leads to increased confusion and then hospitalization.  Patient reports that the BiPAP is broken.  Critical care has been consulted and followed patient while hospitalized, recommended to complete a 5-day course of azithromycin as well as steroids -Company representative will evaluate the machine today, apparently he took the memory card and will check that in [facility] and come back tomorrow and verify whether the machine is malfunctioning at times.  Home discharge hopefully tomorrow  Active Problems Chronic diastolic CHF-appears euvolemic, continue home medications, aspirin, Lasix Essential hypertension-continue home medications Hypothyroidism-continue Synthroid Hyperlipidemia-continue statin Chronic pain-outpatient follow-up Steroid-induced hyperglycemia-she is not diabetic, A1c was 5.9   Scheduled Meds: . arformoterol  15 mcg Nebulization  BID  . aspirin EC  81 mg Oral Daily  . atorvastatin  10 mg Oral q1800  . azithromycin  250 mg Oral Daily  . budesonide (PULMICORT) nebulizer solution  0.5 mg Nebulization BID  . Chlorhexidine Gluconate Cloth  6 each Topical Daily  . citalopram  20 mg Oral Daily  . docusate sodium  100 mg Oral BID  . enoxaparin (LOVENOX) injection  40 mg Subcutaneous Q24H  . furosemide  40 mg Oral Daily  . ipratropium  0.5 mg Nebulization BID  . levothyroxine  50 mcg Oral Q0600  . nicotine  14 mg Transdermal Daily  . predniSONE  40 mg Oral Q breakfast  . sodium chloride flush  3 mL Intravenous Q12H   Continuous Infusions: . lactated ringers 50 mL/hr at 01/29/20 1510   PRN Meds:.acetaminophen **OR** acetaminophen, albuterol, bisacodyl, hydrALAZINE, morphine injection, ondansetron **OR** ondansetron (ZOFRAN) IV, polyethylene glycol, zolpidem  DVT prophylaxis: Lovenox Code Status: DNR Family Communication: Discussed with patient Patient admitted from: Home Anticipated d/c place: Home Barriers to d/c: Clearance from home health/manufacturing company regarding home BiPAP readiness to use  Consultants:  PCCM  Procedures:  None   Microbiology  None   Antimicrobials: Azithromycin     Objective: Vitals:   01/31/20 2320 02/01/20 0326 02/01/20 0347 02/01/20 0730  BP: 120/72 115/74  138/73  Pulse:    65  Resp: 19   18  Temp: 98.6 F (37 C) 98.5 F (36.9 C)  97.8 F (36.6 C)  TempSrc: Axillary Oral  Oral  SpO2:  97%  93%  Weight:   53.6 kg     Intake/Output Summary (Last 24 hours) at 02/01/2020 1533 Last data filed at 02/01/2020 0600 Gross per 24 hour  Intake 225 ml  Output 150 ml  Net 75 ml  Filed Weights   01/31/20 0317 02/01/20 0347  Weight: 53.2 kg 53.6 kg    Examination:  Constitutional: NAD Eyes: no scleral icterus ENMT: Mucous membranes are moist.  Neck: normal, supple Respiratory: clear to auscultation bilaterally, no wheezing, no crackles. Overall distant breath  sounds Cardiovascular: Regular rate and rhythm, no murmurs / rubs / gallops.  Abdomen: non distended, no tenderness. Bowel sounds positive.  Musculoskeletal: no clubbing / cyanosis.  Skin: no rashes Neurologic: CN 2-12 grossly intact. Strength 5/5 in all 4.   Data Reviewed: I have independently reviewed following labs and imaging studies   CBC: Recent Labs  Lab 01/29/20 0515 01/29/20 0529 01/29/20 1116 01/30/20 0305  WBC 6.4  --   --  5.3  NEUTROABS 5.0  --   --   --   HGB 12.5 14.3 13.9 11.7*  HCT 42.8 42.0 41.0 38.9  MCV 106.5*  --   --  103.5*  PLT 159  --   --  123456*   Basic Metabolic Panel: Recent Labs  Lab 01/29/20 0529 01/29/20 1116 01/30/20 0305  NA 136 139 141  K 5.3* 4.4 4.5  CL 91*  --  91*  CO2  --   --  39*  GLUCOSE 152*  --  116*  BUN 18  --  12  CREATININE 0.70  --  0.66  CALCIUM  --   --  9.5   Liver Function Tests: Recent Labs  Lab 01/29/20 0623  AST 25  ALT 17  ALKPHOS 46  BILITOT 0.6  PROT 7.2  ALBUMIN 3.9   Coagulation Profile: No results for input(s): INR, PROTIME in the last 168 hours. HbA1C: Recent Labs    01/29/20 1755  HGBA1C 5.9*   CBG: No results for input(s): GLUCAP in the last 168 hours.  Recent Results (from the past 240 hour(s))  Respiratory Panel by RT PCR (Flu A&B, Covid) - Nasopharyngeal Swab     Status: None   Collection Time: 01/29/20  6:11 AM   Specimen: Nasopharyngeal Swab  Result Value Ref Range Status   SARS Coronavirus 2 by RT PCR NEGATIVE NEGATIVE Final    Comment: (NOTE) SARS-CoV-2 target nucleic acids are NOT DETECTED. The SARS-CoV-2 RNA is generally detectable in upper respiratoy specimens during the acute phase of infection. The lowest concentration of SARS-CoV-2 viral copies this assay can detect is 131 copies/mL. A negative result does not preclude SARS-Cov-2 infection and should not be used as the sole basis for treatment or other patient management decisions. A negative result may occur with    improper specimen collection/handling, submission of specimen other than nasopharyngeal swab, presence of viral mutation(s) within the areas targeted by this assay, and inadequate number of viral copies (<131 copies/mL). A negative result must be combined with clinical observations, patient history, and epidemiological information. The expected result is Negative. Fact Sheet for Patients:  PinkCheek.be Fact Sheet for Healthcare Providers:  GravelBags.it This test is not yet ap proved or cleared by the [place] FDA and  has been authorized for detection and/or diagnosis of SARS-CoV-2 by FDA under an Emergency Use Authorization (EUA). This EUA will remain  in effect (meaning this test can be used) for the duration of the COVID-19 declaration under Section 564(b)(1) of the Act, 21 U.S.C. section 360bbb-3(b)(1), unless the authorization is terminated or revoked sooner.    Influenza A by PCR NEGATIVE NEGATIVE Final   Influenza B by PCR NEGATIVE NEGATIVE Final    Comment: (NOTE) The Xpert Xpress SARS-CoV-2/FLU/RSV  assay is intended as an aid in  the diagnosis of influenza from Nasopharyngeal swab specimens and  should not be used as a sole basis for treatment. Nasal washings and  aspirates are unacceptable for Xpert Xpress SARS-CoV-2/FLU/RSV  testing. Fact Sheet for Patients: PinkCheek.be Fact Sheet for Healthcare Providers: GravelBags.it This test is not yet approved or cleared by the [place] FDA and  has been authorized for detection and/or diagnosis of SARS-CoV-2 by  FDA under an Emergency Use Authorization (EUA). This EUA will remain  in effect (meaning this test can be used) for the duration of the  Covid-19 declaration under Section 564(b)(1) of the Act, 21  U.S.C. section 360bbb-3(b)(1), unless the authorization is  terminated or revoked. Performed at  [facility], [place] [address] [place], [place] [address]   MRSA PCR Screening     Status: None   Collection Time: 01/29/20  6:09 PM   Specimen: Nasal Mucosa; Nasopharyngeal  Result Value Ref Range Status   MRSA by PCR NEGATIVE NEGATIVE Final    Comment:        The GeneXpert MRSA Assay (FDA approved for NASAL specimens only), is one component of a comprehensive MRSA colonization surveillance program. It is not intended to diagnose MRSA infection nor to guide or monitor treatment for MRSA infections. Performed at [facility], [place] [address] [place], [place] [address]      Radiology Studies: No results found.  Marzetta Board, MD, PhD [facility]  Between 7 am - 7 pm I am available, please contact me via Amion or Securechat  Between 7 pm - 7 am I am not available, please contact night coverage MD/APP via Amion

## 2020-02-01 NOTE — Care Management Important Message (Signed)
Important Message  Patient Details  Name: Jasmine [name] MRN: [id number] Date of Birth: 1946-05-23   Medicare Important Message Given:  Yes     Orbie Pyo 02/01/2020, 2:31 PM

## 2020-02-02 DIAGNOSIS — J9622 Acute and chronic respiratory failure with hypercapnia: Secondary | ICD-10-CM | POA: Diagnosis not present

## 2020-02-02 DIAGNOSIS — I5032 Chronic diastolic (congestive) heart failure: Secondary | ICD-10-CM | POA: Diagnosis not present

## 2020-02-02 DIAGNOSIS — R4182 Altered mental status, unspecified: Secondary | ICD-10-CM | POA: Diagnosis not present

## 2020-02-02 DIAGNOSIS — T50905A Adverse effect of unspecified drugs, medicaments and biological substances, initial encounter: Secondary | ICD-10-CM

## 2020-02-02 MED ORDER — PREDNISONE 10 MG PO TABS: 40.0000 mg | ORAL_TABLET | Freq: Every day | ORAL | 0 refills | Status: DC

## 2020-02-02 NOTE — Discharge Summary (Signed)
Physician Discharge Summary  Jasmine [name] [id number] DOB: 11-Feb-1946 DOA: 01/29/2020  PCP: Biagio Borg, MD  Admit date: 01/29/2020 Discharge date: 02/02/2020  Admitted From: home Disposition:  home  Recommendations for Outpatient Follow-up:  1. Follow up with PCP in 1-2 weeks 2. Follow-up with outpatient palliative care 3. Home health PT  Home Health: PT Equipment/Devices: Home O2, BiPAP  Discharge Condition: Stable CODE STATUS: DNR Diet recommendation: Regular diet  HPI: Per admitting MD, Jasmine [name] is a 74 y.o. female with medical history significant of hypothyroidism s/p RAI; HTN; COPD on 3-4L home O2 and nightly BIPAP; and chronic diastolic CHF presenting with dyspnea.  She was last admitted from 2/14-17 for acute on chronic respiratory failure with hypercardia; she was treated with BIPAP and breathing treatments and discharged with home health on 4L  O2.  This time, she reported that her BIPAP machine broke 2 days ago and HH was unable to fix it.  She has felt very tired.  At the time of my evaluation, she would open her eyes minimally but did turn her head in my direction.  She did not make significant effort to communicate but was able to follow some directions. Her husband reports that she is supposed to be on 3L home O2 24/7.  He reports that this causes a build-up of CO2 and she is supposed to use BIPAP - "sometimes she uses it, sometimes she don't."  She used it maybe an hour last night and none the night before.  He reports that the mask "wasn't fitting exactly right" and they made some adjustments.  The medics came and got it fitted, but EMS realized that the concentrator might be too far from the bed.  He noticed confusion starting day before yesterday.  He does not think he has been taking her medication - she took none yesterday and maybe not the day before either.  He has noticed "a slight case of dementia in her" and so does get feisty at times.  He thinks she  would benefit from placement, but he doesn't know how she would respond to that; he would like to request her last wishes.  He would want to involve her nieces Jasmine [name] and Jasmine [name]) in decisions about moving forward and he is open to palliative care.  Hospital Course / Discharge diagnoses: Principal Problem Acute on chronic hypoxic and hypercarbic respiratory failure, COPD exacerbation, possibly a component of non-adherence -patient was admitted to the hospital and placed on NIPPV. Critical care has been consulted and followed patient while hospitalized. Se was placed on steroids as well as antibiotics.  She completed 5 days of azithromycin while hospitalized.  -Patient reports compliance with the BiPAP at home but sometimes she falls asleep while watching TV and realizes that machine is not on.  Per prior notes, family had reported that she has been noncompliant which generally leads to increased confusion and then hospitalization. Patient also reported that the BiPAP is broken. Home health agency/BiPAP company has been contacted, machine was brought to the hospital and evaluated, seems to be working order.  BiPAP at home usage has been reviewed by me, she has had several days in which the BiPAP was not used at all and other days when it was only used for 1 to 3 hours per night which is probably not sufficient.  This was discussed with the patient and she expressed understanding and will try to use it every night and throughout her entire sleep time.  She is back to baseline, will be discharged home in stable condition with outpatient follow-up.  She will complete a prednisone taper upon discharge.  She is also followed by palliative as an outpatient.  Active Problems Chronic diastolic CHF-appears euvolemic, continue home medications Essential hypertension-continue home medications Hypothyroidism-continue Synthroid Hyperlipidemia-continue statin Chronic pain-outpatient follow-up Steroid-induced  hyperglycemia-she is not diabetic, A1c was 5.9  Discharge Instructions   Allergies as of 02/02/2020      Reactions   Alendronate Sodium Other (See Comments)   Patient does not remember this reaction      Medication List    TAKE these medications   albuterol 108 (90 Base) MCG/ACT inhaler Commonly known as: VENTOLIN HFA INHALE 1 TO 2 PUFFS INTO THE LUNGS EVERY 6 (SIX) HOURS AS NEEDED FOR WHEEZING OR SHORTNESS OF BREATH. What changed:   how much to take  how to take this  when to take this  reasons to take this  additional instructions   albuterol (2.5 MG/3ML) 0.083% nebulizer solution Commonly known as: PROVENTIL Take 3 mLs (2.5 mg total) by nebulization every 6 (six) hours as needed for wheezing or shortness of breath. What changed: Another medication with the same name was changed. Make sure you understand how and when to take each.   aspirin 81 MG EC tablet Take 1 tablet (81 mg total) by mouth daily.   atorvastatin 10 MG tablet Commonly known as: LIPITOR TAKE ONE TABLET BY MOUTH IN THE EVENING What changed:   how much to take  how to take this  when to take this   citalopram 20 MG tablet Commonly known as: CELEXA NEW PRESCRIPTION REQUEST: CITALOPRAM 20MG  - TAKE ONE TABLET BY MOUTH DAILY What changed:   how much to take  how to take this  when to take this  additional instructions   fluticasone furoate-vilanterol 200-25 MCG/INH Aepb Commonly known as: BREO ELLIPTA Inhale 1 puff into the lungs daily.   furosemide 40 MG tablet Commonly known as: LASIX NEW PRESCRIPTION REQUEST: FUROSEMIDE 40MG  - TAKE ONE TABLET BY MOUTH IN THE MORNING AND TAKE ONE TABLET BY MOUTH AS NEEDED IN THE EVENING What changed:   how much to take  how to take this  when to take this  additional instructions   levothyroxine 75 MCG tablet Commonly known as: SYNTHROID NEW PRESCRIPTION REQUEST: LEVOTHYROXINE 75MCG - TAKE ONE TABLET BY MOUTH EARLY MORNING What changed:     how much to take  how to take this  when to take this  additional instructions   OXYGEN Inhale 3 L into the lungs. 24/7   potassium chloride 10 MEQ tablet Commonly known as: KLOR-CON NEW PRESCRIPTION REQUEST: KLOR-CON 10MEQ - TAKE ONE TABLET BY MOUTH TWICE DAILY What changed:   how much to take  how to take this  when to take this  additional instructions   predniSONE 10 MG tablet Commonly known as: DELTASONE Take 4 tablets (40 mg total) by mouth daily. 4 tablets daily x 2 days then 3 daily x 2 days then 2 daily x 2 days then 1 daily x 2 days   Spiriva HandiHaler 18 MCG inhalation capsule Generic drug: tiotropium NEW PRESCRIPTION REQUEST: SPIRIVA 18MCG HANDIHALER - TAKE TWO INHALATIONS OF THE CONTENTS OF ONE CAPSULE BY ONCE DAILY What changed:   how much to take  how to take this  when to take this  additional instructions   traMADol 50 MG tablet Commonly known as: ULTRAM Take 1 tablet (50 mg total) by  mouth 2 (two) times daily as needed. What changed: reasons to take this      Follow-up Information    Parrett, Fonnie Mu, NP. Go on 02/13/2020.   Specialty: Pulmonary Disease Why: Appointment is at 2:30 PM.  Please call our office if you are unable to make or reschedule. Thank you.  Contact information: [facility] [address] [place] [address] [phone]           Consultations:  PCCM  Procedures/Studies:  DG Chest Portable 1 View  Result Date: 01/29/2020 CLINICAL DATA:  Initial evaluation for acute hypoxia. EXAM: PORTABLE CHEST 1 VIEW COMPARISON:  Prior radiograph from 12/04/2019. FINDINGS: Transverse heart size at the upper limits of normal. Mediastinal silhouette within normal limits. Aortic atherosclerosis. Lungs normally inflated. Oblique linear density at the right upper lobe most likely reflects scarring, stable from previous. No focal infiltrates. No edema or definite pleural effusion. No pneumothorax. No acute osseous finding.  IMPRESSION: 1. Stable appearance of the chest. No active cardiopulmonary disease. 2.  Aortic Atherosclerosis (ICD10-I70.0). Electronically Signed   By: Jeannine Boga M.D.   On: 01/29/2020 05:58     Subjective: - no chest pain, shortness of breath, no abdominal pain, nausea or vomiting.   Discharge Exam: BP 120/65 (BP Location: Right Arm)   Pulse 76   Temp 98.6 F (37 C) (Oral)   Resp 18   Wt 53.6 kg   SpO2 98%   BMI 23.08 kg/m   General: Pt is alert, awake, not in acute distress Cardiovascular: RRR, S1/S2 +, no rubs, no gallops Respiratory: CTA bilaterally, no wheezing, no rhonchi Abdominal: Soft, NT, ND, bowel sounds + Extremities: no edema, no cyanosis   The results of significant diagnostics from this hospitalization (including imaging, microbiology, ancillary and laboratory) are listed below for reference.     Microbiology: Recent Results (from the past 240 hour(s))  Respiratory Panel by RT PCR (Flu A&B, Covid) - Nasopharyngeal Swab     Status: None   Collection Time: 01/29/20  6:11 AM   Specimen: Nasopharyngeal Swab  Result Value Ref Range Status   SARS Coronavirus 2 by RT PCR NEGATIVE NEGATIVE Final    Comment: (NOTE) SARS-CoV-2 target nucleic acids are NOT DETECTED. The SARS-CoV-2 RNA is generally detectable in upper respiratoy specimens during the acute phase of infection. The lowest concentration of SARS-CoV-2 viral copies this assay can detect is 131 copies/mL. A negative result does not preclude SARS-Cov-2 infection and should not be used as the sole basis for treatment or other patient management decisions. A negative result may occur with  improper specimen collection/handling, submission of specimen other than nasopharyngeal swab, presence of viral mutation(s) within the areas targeted by this assay, and inadequate number of viral copies (<131 copies/mL). A negative result must be combined with clinical observations, patient history, and  epidemiological information. The expected result is Negative. Fact Sheet for Patients:  PinkCheek.be Fact Sheet for Healthcare Providers:  GravelBags.it This test is not yet ap proved or cleared by the [place] FDA and  has been authorized for detection and/or diagnosis of SARS-CoV-2 by FDA under an Emergency Use Authorization (EUA). This EUA will remain  in effect (meaning this test can be used) for the duration of the COVID-19 declaration under Section 564(b)(1) of the Act, 21 U.S.C. section 360bbb-3(b)(1), unless the authorization is terminated or revoked sooner.    Influenza A by PCR NEGATIVE NEGATIVE Final   Influenza B by PCR NEGATIVE NEGATIVE Final    Comment: (  NOTE) The Xpert Xpress SARS-CoV-2/FLU/RSV assay is intended as an aid in  the diagnosis of influenza from Nasopharyngeal swab specimens and  should not be used as a sole basis for treatment. Nasal washings and  aspirates are unacceptable for Xpert Xpress SARS-CoV-2/FLU/RSV  testing. Fact Sheet for Patients: PinkCheek.be Fact Sheet for Healthcare Providers: GravelBags.it This test is not yet approved or cleared by the [place] FDA and  has been authorized for detection and/or diagnosis of SARS-CoV-2 by  FDA under an Emergency Use Authorization (EUA). This EUA will remain  in effect (meaning this test can be used) for the duration of the  Covid-19 declaration under Section 564(b)(1) of the Act, 21  U.S.C. section 360bbb-3(b)(1), unless the authorization is  terminated or revoked. Performed at [facility], [place] [address] [place], [place] [address]   MRSA PCR Screening     Status: None   Collection Time: 01/29/20  6:09 PM   Specimen: Nasal Mucosa; Nasopharyngeal  Result Value Ref Range Status   MRSA by PCR NEGATIVE NEGATIVE Final    Comment:        The GeneXpert MRSA Assay  (FDA approved for NASAL specimens only), is one component of a comprehensive MRSA colonization surveillance program. It is not intended to diagnose MRSA infection nor to guide or monitor treatment for MRSA infections. Performed at [facility], [place] [address], [place], [place] [address]      Labs: Basic Metabolic Panel: Recent Labs  Lab 01/29/20 0529 01/29/20 1116 01/30/20 0305  NA 136 139 141  K 5.3* 4.4 4.5  CL 91*  --  91*  CO2  --   --  39*  GLUCOSE 152*  --  116*  BUN 18  --  12  CREATININE 0.70  --  0.66  CALCIUM  --   --  9.5   Liver Function Tests: Recent Labs  Lab 01/29/20 0623  AST 25  ALT 17  ALKPHOS 46  BILITOT 0.6  PROT 7.2  ALBUMIN 3.9   CBC: Recent Labs  Lab 01/29/20 0515 01/29/20 0529 01/29/20 1116 01/30/20 0305  WBC 6.4  --   --  5.3  NEUTROABS 5.0  --   --   --   HGB 12.5 14.3 13.9 11.7*  HCT 42.8 42.0 41.0 38.9  MCV 106.5*  --   --  103.5*  PLT 159  --   --  149*   CBG: No results for input(s): GLUCAP in the last 168 hours. Hgb A1c No results for input(s): HGBA1C in the last 72 hours. Lipid Profile No results for input(s): CHOL, HDL, LDLCALC, TRIG, CHOLHDL, LDLDIRECT in the last 72 hours. Thyroid function studies No results for input(s): TSH, T4TOTAL, T3FREE, THYROIDAB in the last 72 hours.  Invalid input(s): FREET3 Urinalysis    Component Value Date/Time   COLORURINE YELLOW 01/29/2020 0532   APPEARANCEUR CLEAR 01/29/2020 0532   LABSPEC 1.025 01/29/2020 0532   PHURINE 5.0 01/29/2020 0532   GLUCOSEU NEGATIVE 01/29/2020 0532   GLUCOSEU NEGATIVE 09/20/2018 1426   HGBUR NEGATIVE 01/29/2020 0532   BILIRUBINUR NEGATIVE 01/29/2020 0532   BILIRUBINUR negative 08/11/2011 1358   KETONESUR 5 (A) 01/29/2020 0532   PROTEINUR 30 (A) 01/29/2020 0532   UROBILINOGEN 0.2 09/20/2018 1426   NITRITE NEGATIVE 01/29/2020 0532   LEUKOCYTESUR NEGATIVE 01/29/2020 0532    FURTHER DISCHARGE INSTRUCTIONS:   Get Medicines reviewed and  adjusted: Please take all your medications with you for your next visit with your Primary  MD   Laboratory/radiological data: Please request your Primary MD to go over all hospital tests and procedure/radiological results at the follow up, please ask your Primary MD to get all Hospital records sent to his/her office.   In some cases, they will be blood work, cultures and biopsy results pending at the time of your discharge. Please request that your primary care M.D. goes through all the records of your hospital data and follows up on these results.   Also Note the following: If you experience worsening of your admission symptoms, develop shortness of breath, life threatening emergency, suicidal or homicidal thoughts you must seek medical attention immediately by calling 911 or calling your MD immediately  if symptoms less severe.   You must read complete instructions/literature along with all the possible adverse reactions/side effects for all the Medicines you take and that have been prescribed to you. Take any new Medicines after you have completely understood and accpet all the possible adverse reactions/side effects.    Do not drive when taking Pain medications or sleeping medications (Benzodaizepines)   Do not take more than prescribed Pain, Sleep and Anxiety Medications. It is not advisable to combine anxiety,sleep and pain medications without talking with your primary care practitioner   Special Instructions: If you have smoked or chewed Tobacco  in the last 2 yrs please stop smoking, stop any regular Alcohol  and or any Recreational drug use.   Wear Seat belts while driving.   Please note: You were cared for by a hospitalist during your hospital stay. Once you are discharged, your primary care physician will handle any further medical issues. Please note that NO REFILLS for any discharge medications will be authorized once you are discharged, as it is imperative that you return to your  primary care physician (or establish a relationship with a primary care physician if you do not have one) for your post hospital discharge needs so that they can reassess your need for medications and monitor your lab values.  Time coordinating discharge: 40 minutes  SIGNED:  Marzetta Board, MD, PhD 02/02/2020, 4:27 PM

## 2020-02-02 NOTE — Progress Notes (Signed)
Physical Therapy Treatment Patient Details Name: Jasmine [name] MRN: [id number] DOB: [birth year] Today's Date: 02/02/2020    History of Present Illness 74yo female with hx COPD on 3-4L home O2 and nocturnal bipap, chronic diastolic CHF presented with worsening sob and hypercarbic. Pt admitted for tx of COPD exacerbation    PT Comments    Pt progressing well.  Able to increase gait distance and requiring less cues with gait.  Pt did need min cues in room for safety with O2 tubing.  O2 sats were stable on 3 L O2 with moiblity.    Follow Up Recommendations  Home health PT;Supervision - Intermittent     Equipment Recommendations  None recommended by PT    Recommendations for Other Services       Precautions / Restrictions Precautions Precautions: Fall Precaution Comments: 2.5L [place] Restrictions Weight Bearing Restrictions: No    Mobility  Bed Mobility               General bed mobility comments: Pt seated in bedside chair  Transfers Overall transfer level: Needs assistance Equipment used: Rolling walker (2 wheeled) Transfers: Sit to/from Stand Sit to Stand: Supervision         General transfer comment: To ensure balance and safety with O2 tubing  Ambulation/Gait Ambulation/Gait assistance: Supervision Gait Distance (Feet): 300 Feet Assistive device: Rolling walker (2 wheeled) Gait Pattern/deviations: Step-through pattern     General Gait Details: min cue for RW proximity; pt with good upright posture today;  DOE 1/4, on 3 L O2 with sats 94% with ambulation   Stairs             Wheelchair Mobility    Modified Rankin (Stroke Patients Only)       Balance Overall balance assessment: Needs assistance Sitting-balance support: Feet supported Sitting balance-Leahy Scale: Normal     Standing balance support: Single extremity supported;During functional activity Standing balance-Leahy Scale: Good                              Cognition  Arousal/Alertness: Awake/alert Behavior During Therapy: WFL for tasks assessed/performed Overall Cognitive Status: Within Functional Limits for tasks assessed                                 General Comments: Pt very pleasant and eager to work with therapy      Exercises Other Exercises Other Exercises: Encouraged pursed lip breathing throughout    General Comments General comments (skin integrity, edema, etc.): Pt on 2.5L [place] with SpO2 dropping to 86% during activity with pt requiring ~1-2 min seated recovery to return to 90s.       Pertinent Vitals/Pain Pain Assessment: No/denies pain    Home Living                      Prior Function            PT Goals (current goals can now be found in the care plan section) Acute Rehab PT Goals Patient Stated Goal: to go home PT Goal Formulation: With patient Time For Goal Achievement: 02/14/20 Potential to Achieve Goals: Good Progress towards PT goals: Progressing toward goals    Frequency    Min 3X/week      PT Plan Current plan remains appropriate    Co-evaluation  AM-PAC PT "6 Clicks" Mobility   Outcome Measure  Help needed turning from your back to your side while in a flat bed without using bedrails?: None Help needed moving from lying on your back to sitting on the side of a flat bed without using bedrails?: None Help needed moving to and from a bed to a chair (including a wheelchair)?: None Help needed standing up from a chair using your arms (e.g., wheelchair or bedside chair)?: None Help needed to walk in hospital room?: None Help needed climbing 3-5 steps with a railing? : None 6 Click Score: 24    End of Session Equipment Utilized During Treatment: Gait belt;Oxygen Activity Tolerance: Patient tolerated treatment well Patient left: in chair;with call bell/phone within reach Nurse Communication: Mobility status PT Visit Diagnosis: Other abnormalities of gait and  mobility (R26.89);Muscle weakness (generalized) (M62.81)     Time: [id number] PT Time Calculation (min) (ACUTE ONLY): 14 min  Charges:  $Gait Training: 8-22 mins                     Maggie Font, PT Acute Rehab Services Pager [phone] [facility] [phone]    Karlton Lemon 02/02/2020, 2:29 PM

## 2020-02-02 NOTE — TOC Transition Note (Addendum)
Transition of Care [facility]) - CM/SW Discharge Note   Patient Details  Name: DENNIELLE LEVENBERG MRN: [id number] Date of Birth: 10/31/1945  Transition of Care [facility] CM/SW Contact:  Atilano Median, LCSW Phone Number: 02/02/2020, 2:44 PM   Clinical Narrative:     Discharged home with home health services. Bipap was serviced here at the hospital prior to discharge home. Family will provide transport home. Patient is already active with [facility] for outpatient palliative services. Liaison informed and services will be resumed. No other needs at this time. Case closed to this CSW.   Final next level of care: Home w Home Health Services Barriers to Discharge: Barriers Resolved   Patient Goals and CMS Choice   CMS Medicare.gov Compare Post Acute Care list provided to:: Patient Choice offered to / list presented to : Patient  Discharge Placement                       Discharge Plan and Services                DME Arranged: Bipap DME Agency: [employer] Date DME Agency Contacted: 02/02/20 Time DME Agency Contacted: [phone] Representative spoke with at DME Agency: Thedore Mins HH Arranged: PT, OT [place] Agency: [place] Date [place]: 02/02/20 Time [place]: [place] Representative spoke with at [place]: [place] ([place] Interventions     Readmission Risk Interventions Readmission Risk Prevention Plan 12/07/2019 08/17/2019 03/10/2019  Transportation Screening Complete Complete Complete  PCP or Specialist Appt within 3-5 Days Complete - -  HRI or Home Care Consult Complete - -  Social Work Consult for [place] Planning/Counseling Complete - -  Palliative Care Screening Not Applicable - -  Medication Review Press photographer) Complete - Complete  PCP or Specialist appointment within 3-5 days of discharge - - Complete  HRI or [place] - - Complete  SW Recovery Care/Counseling Consult - - Complete  [place] - - Not Applicable  Some recent data might be hidden

## 2020-02-02 NOTE — Progress Notes (Addendum)
Ship broker received referral for pt to participate in [facility] palliative program once pt discharges. Called pt's hospital room to confirm interest with no answer. Liaison to follow while in hospital to alert  palliative team who will then outreach pt and/or family to arrange Palliative admission visit.     Please do not hesitate to call with any questions and thank you for the referral.    1410 addendum: pt is a current palliative pt with [facility]. We will continue to follow pt during hospital stay to alert [facility] staff of [place], RN  [facility] Liaison   [phone]

## 2020-02-02 NOTE — Progress Notes (Signed)
Occupational Therapy Treatment Patient Details Name: Jasmine [name] MRN: [id number] DOB: 06/11/1946 Today's Date: 02/02/2020    History of present illness 74yo female with hx COPD on 3-4L home O2 and nocturnal bipap, chronic diastolic CHF presented with worsening sob and hypercarbic. Pt admitted for tx of COPD exacerbation   OT comments  Pt making progress in therapy, demonstrating improved activity tolerance and independence in self-care and mobility tasks. Continued education with pt on safety strategies and activity pacing to increase balance and prevent future falls with fair understanding and follow through. Pt able to ambulate to/from bathroom with RW and supervision, noting 0 instances of LOB. Pt completed toileting task as well as stood 1 x 6.5 min and 1 x 4 min at the sink to complete grooming/hygiene tasks. Pt required 1 seated rest break due to fatigue. SpO2 dropped to 86% on 2.5L [place] during activity with pt requiring 1-2 min seated recovery to return to 90s. Continued education with pt on pursed lip breathing strategies. Simulated tub/shower transfer in room with education provided on safety strategies. Pt able to complete x 3 with min guard, noting 0 instances of LOB. All questions/concerns answered at this time. OT will continue to follow acutely.     Follow Up Recommendations  Home health OT;Supervision/Assistance - 24 hour    Equipment Recommendations  3 in 1 bedside commode(for use in shower)    Recommendations for Other Services      Precautions / Restrictions Precautions Precautions: Fall Precaution Comments: 2.5L [place] Restrictions Weight Bearing Restrictions: No       Mobility Bed Mobility               General bed mobility comments: Pt seated in bedside chair upon OT arrival  Transfers Overall transfer level: Needs assistance Equipment used: Rolling walker (2 wheeled) Transfers: Sit to/from Stand Sit to Stand: Supervision         General transfer  comment: To ensure balance and safety with O2 tubing    Balance Overall balance assessment: Needs assistance Sitting-balance support: Feet supported Sitting balance-Leahy Scale: Fair       Standing balance-Leahy Scale: Fair                             ADL either performed or assessed with clinical judgement   ADL Overall ADL's : Needs assistance/impaired     Grooming: Set up;Supervision/safety;Wash/dry hands;Wash/dry face;Brushing hair;Standing Grooming Details (indicate cue type and reason): While standing at the sink                 Toilet Transfer: Supervision/safety;Set up;Ambulation;Regular Toilet;Grab bars   Toileting- Clothing Manipulation and Hygiene: Supervision/safety;Set up;Sit to/from stand   Tub/ Shower Transfer: Min guard;Tub transfer;Grab bars Tub/Shower Transfer Details (indicate cue type and reason): Simulated in room. Pt able to complete x 3 with min guard. Noted 0 instances of LOB.  Functional mobility during ADLs: Supervision/safety;Rolling walker General ADL Comments: Pt able to ambulate to/from bathroom with RW and supervision, demonstrating good balance and safety. Pt tolerated standing 1 x 6.5 min and 1 x 4 min at the sink to complete grooming/hygiene tasks.      Vision       Perception     Praxis      Cognition Arousal/Alertness: Awake/alert Behavior During Therapy: WFL for tasks assessed/performed Overall Cognitive Status: No family/caregiver present to determine baseline cognitive functioning  General Comments: Pt pleasant and willing to participate in therapy services. Pt continues to require min cues for safety with O2 line management.         Exercises Exercises: Other exercises Other Exercises Other Exercises: Encouraged pursed lip breathing throughout   Shoulder Instructions       General Comments Pt on 2.5L [place] with SpO2 dropping to 86% during activity with pt  requiring ~1-2 min seated recovery to return to 90s.     Pertinent Vitals/ Pain       Pain Assessment: No/denies pain  Home Living                                          Prior Functioning/Environment              Frequency           Progress Toward Goals  OT Goals(current goals can now be found in the care plan section)  Progress towards OT goals: Progressing toward goals  ADL Goals Pt Will Perform Tub/Shower Transfer: with supervision;ambulating;Shower transfer;grab bars Additional ADL Goal #1: Pt to complete all ADLs with modified independence and 0 instances of LOB. Additional ADL Goal #2: Pt to tolerate standing up to 10 minutes with modified independence, in preparation for ADLs. Additional ADL Goal #3: Pt to recall and verbalize 3 fall prevention strategies with 0 verbal cues. Additional ADL Goal #4: Pt to recall and verbalize 3 energy conservation strategies with 0 verbal cues.  Plan Discharge plan remains appropriate    Co-evaluation                 AM-PAC OT "6 Clicks" Daily Activity     Outcome Measure   Help from another person eating meals?: None Help from another person taking care of personal grooming?: A Little Help from another person toileting, which includes using toliet, bedpan, or urinal?: A Little Help from another person bathing (including washing, rinsing, drying)?: A Little Help from another person to put on and taking off regular upper body clothing?: A Little Help from another person to put on and taking off regular lower body clothing?: A Little 6 Click Score: 19    End of Session Equipment Utilized During Treatment: Gait belt;Rolling walker;Oxygen  OT Visit Diagnosis: Unsteadiness on feet (R26.81);Muscle weakness (generalized) (M62.81)   Activity Tolerance Patient tolerated treatment well   Patient Left in chair;with call bell/phone within reach   Nurse Communication Mobility status        Time:  [id number] OT Time Calculation (min): 30 min  Charges: OT General Charges $OT Visit: 1 Visit OT Treatments $Self Care/Home Management : 8-22 mins $Therapeutic Activity: 8-22 mins  Mauri Brooklyn OTR/L [phone]   Mauri Brooklyn 02/02/2020, 2:20 PM

## 2020-02-03 ENCOUNTER — Telehealth: Payer: Self-pay | Admitting: *Deleted

## 2020-02-03 ENCOUNTER — Other Ambulatory Visit: Payer: Self-pay

## 2020-02-03 MED ORDER — PREDNISONE 10 MG PO TABS: 40.0000 mg | ORAL_TABLET | Freq: Every day | ORAL | 0 refills | Status: DC

## 2020-02-03 NOTE — Patient Outreach (Signed)
[facility]) Care Management  02/03/2020  Jasmine [name] 1945/10/23 [id number]   Telephone call to patient for follow up hospitalization. No answer.  Unable to leave a voice mail.    Plan: RN CM will attempt patient again  Within 4 business days and send letter.    [name], RN, MSN [place] [facility] Care Management Coordinator Direct Line [phone] Cell [phone] Toll Free: [phone]  Fax: [phone]

## 2020-02-03 NOTE — Telephone Encounter (Signed)
Pt was on TCM report admitted 01/29/20 for Acute on chronic hypoxic and hypercarbic respiratory failure, COPD exacerbation, possibly a component of non-adherence. placed on NIPPV. Critical care has been consulted and followed patient while hospitalized. Se was placed on steroids as well as antibiotics.  She completed 5 days of azithromycin while hospitalized. Pt D/C 02/02/20, and will follow-up w/pulmonologist Tammy Parrett 4/26/*21../l;mb

## 2020-02-04 DIAGNOSIS — J9611 Chronic respiratory failure with hypoxia: Secondary | ICD-10-CM | POA: Diagnosis not present

## 2020-02-04 DIAGNOSIS — R911 Solitary pulmonary nodule: Secondary | ICD-10-CM | POA: Diagnosis not present

## 2020-02-04 DIAGNOSIS — R300 Dysuria: Secondary | ICD-10-CM | POA: Diagnosis not present

## 2020-02-04 DIAGNOSIS — J449 Chronic obstructive pulmonary disease, unspecified: Secondary | ICD-10-CM | POA: Diagnosis not present

## 2020-02-06 ENCOUNTER — Telehealth: Payer: Self-pay

## 2020-02-06 ENCOUNTER — Other Ambulatory Visit: Payer: Self-pay

## 2020-02-06 ENCOUNTER — Ambulatory Visit (INDEPENDENT_AMBULATORY_CARE_PROVIDER_SITE_OTHER): Payer: Medicare HMO

## 2020-02-06 DIAGNOSIS — Z0000 Encounter for general adult medical examination without abnormal findings: Secondary | ICD-10-CM | POA: Diagnosis not present

## 2020-02-06 NOTE — Patient Instructions (Signed)
Jasmine [name] , Thank you for taking time to come for your Medicare Wellness Visit. I appreciate your ongoing commitment to your health goals. Please review the following plan we discussed and let me know if I can assist you in the future.   Screening recommendations/referrals: Colorectal Screening: 12/31/2018 Mammogram: 11/14/2019  Vision and Dental Exams: Recommended annual ophthalmology exams for early detection of glaucoma and other disorders of the eye Recommended annual dental exams for proper oral hygiene  Vaccinations: Influenza vaccine: 08/30/2019 Pneumococcal vaccine: completed; done 2/11/216 and 06/16/2018 Tdap vaccine: 08/23/2009; Overdue. Done every 10 years. Shingles vaccine: Please call your insurance company to determine your out of pocket expense for the Shingrix vaccine. You may receive this vaccine at your local pharmacy. Covid vaccine: completed; done [name] 12/24/2019 and 01/14/2020   Advanced directives: Advance directives discussed with you today. Please bring a copy of your POA (Power of [name]) and/or Living Will to your next appointment.  Goals:  Recommend to drink at least 6-8 8oz glasses of water per day.  Recommend to exercise for at least 150 minutes per week.  Recommend to remove any items from the home that may cause slips or trips.  Recommend to decrease portion sizes by eating 3 small healthy meals and at least 2 healthy snacks per day.  Recommend to begin DASH diet as directed below  Recommend to continue efforts to reduce smoking habits until no longer smoking. Smoking Cessation literature is attached below.  Next appointment: Please schedule your Annual Wellness Visit with your Nurse Health Advisor in one year.  Preventive Care 20 Years and Older, Female Preventive care refers to lifestyle choices and visits with your health care provider that can promote health and wellness. What does preventive care include?  A yearly physical exam. This is also  called an annual well check.  Dental exams once or twice a year.  Routine eye exams. Ask your health care provider how often you should have your eyes checked.  Personal lifestyle choices, including:  Daily care of your teeth and gums.  Regular physical activity.  Eating a healthy diet.  Avoiding tobacco and drug use.  Limiting alcohol use.  Practicing safe sex.  Taking low-dose aspirin every day if recommended by your health care provider.  Taking vitamin and mineral supplements as recommended by your health care provider. What happens during an annual well check? The services and screenings done by your health care provider during your annual well check will depend on your age, overall health, lifestyle risk factors, and family history of disease. Counseling  Your health care provider may ask you questions about your:  Alcohol use.  Tobacco use.  Drug use.  Emotional well-being.  Home and relationship well-being.  Sexual activity.  Eating habits.  History of falls.  Memory and ability to understand (cognition).  Work and work Statistician.  Reproductive health. Screening  You may have the following tests or measurements:  Height, weight, and BMI.  Blood pressure.  Lipid and cholesterol levels. These may be checked every 5 years, or more frequently if you are over 33 years old.  Skin check.  Lung cancer screening. You may have this screening every year starting at age 57 if you have a 30-pack-year history of smoking and currently smoke or have quit within the past 15 years.  Fecal occult blood test (FOBT) of the stool. You may have this test every year starting at age 11.  Flexible sigmoidoscopy or colonoscopy. You may have a sigmoidoscopy every  5 years or a colonoscopy every 10 years starting at age 97.  Hepatitis C blood test.  Hepatitis B blood test.  Sexually transmitted disease (STD) testing.  Diabetes screening. This is done by checking  your blood sugar (glucose) after you have not eaten for a while (fasting). You may have this done every 1-3 years.  Bone density scan. This is done to screen for osteoporosis. You may have this done starting at age 63.  Mammogram. This may be done every 1-2 years. Talk to your health care provider about how often you should have regular mammograms. Talk with your health care provider about your test results, treatment options, and if necessary, the need for more tests. Vaccines  Your health care provider may recommend certain vaccines, such as:  Influenza vaccine. This is recommended every year.  Tetanus, diphtheria, and acellular pertussis (Tdap, Td) vaccine. You may need a Td booster every 10 years.  Zoster vaccine. You may need this after age 45.  Pneumococcal 13-valent conjugate (PCV13) vaccine. One dose is recommended after age 45.  Pneumococcal polysaccharide (PPSV23) vaccine. One dose is recommended after age 32. Talk to your health care provider about which screenings and vaccines you need and how often you need them. This information is not intended to replace advice given to you by your health care provider. Make sure you discuss any questions you have with your health care provider. Document Released: 11/02/2015 Document Revised: 06/25/2016 Document Reviewed: 08/07/2015 Elsevier Interactive Patient Education  2017 [place] Prevention in the Home Falls can cause injuries. They can happen to people of all ages. There are many things you can do to make your home safe and to help prevent falls. What can I do on the outside of my home?  Regularly fix the edges of walkways and driveways and fix any cracks.  Remove anything that might make you trip as you walk through a door, such as a raised step or threshold.  Trim any bushes or trees on the path to your home.  Use bright outdoor lighting.  Clear any walking paths of anything that might make someone trip, such as  rocks or tools.  Regularly check to see if handrails are loose or broken. Make sure that both sides of any steps have handrails.  Any raised decks and porches should have guardrails on the edges.  Have any leaves, snow, or ice cleared regularly.  Use sand or salt on walking paths during winter.  Clean up any spills in your garage right away. This includes oil or grease spills. What can I do in the bathroom?  Use night lights.  Install grab bars by the toilet and in the tub and shower. Do not use towel bars as grab bars.  Use non-skid mats or decals in the tub or shower.  If you need to sit down in the shower, use a plastic, non-slip stool.  Keep the floor dry. Clean up any water that spills on the floor as soon as it happens.  Remove soap buildup in the tub or shower regularly.  Attach bath mats securely with double-sided non-slip rug tape.  Do not have throw rugs and other things on the floor that can make you trip. What can I do in the bedroom?  Use night lights.  Make sure that you have a light by your bed that is easy to reach.  Do not use any sheets or blankets that are too big for your bed. They should not  hang down onto the floor.  Have a firm chair that has side arms. You can use this for support while you get dressed.  Do not have throw rugs and other things on the floor that can make you trip. What can I do in the kitchen?  Clean up any spills right away.  Avoid walking on wet floors.  Keep items that you use a lot in easy-to-reach places.  If you need to reach something above you, use a strong step stool that has a grab bar.  Keep electrical cords out of the way.  Do not use floor polish or wax that makes floors slippery. If you must use wax, use non-skid floor wax.  Do not have throw rugs and other things on the floor that can make you trip. What can I do with my stairs?  Do not leave any items on the stairs.  Make sure that there are handrails on  both sides of the stairs and use them. Fix handrails that are broken or loose. Make sure that handrails are as long as the stairways.  Check any carpeting to make sure that it is firmly attached to the stairs. Fix any carpet that is loose or worn.  Avoid having throw rugs at the top or bottom of the stairs. If you do have throw rugs, attach them to the floor with carpet tape.  Make sure that you have a light switch at the top of the stairs and the bottom of the stairs. If you do not have them, ask someone to add them for you. What else can I do to help prevent falls?  Wear shoes that:  Do not have high heels.  Have rubber bottoms.  Are comfortable and fit you well.  Are closed at the toe. Do not wear sandals.  If you use a stepladder:  Make sure that it is fully opened. Do not climb a closed stepladder.  Make sure that both sides of the stepladder are locked into place.  Ask someone to hold it for you, if possible.  Clearly mark and make sure that you can see:  Any grab bars or handrails.  First and last steps.  Where the edge of each step is.  Use tools that help you move around (mobility aids) if they are needed. These include:  Canes.  Walkers.  Scooters.  Crutches.  Turn on the lights when you go into a dark area. Replace any light bulbs as soon as they burn out.  Set up your furniture so you have a clear path. Avoid moving your furniture around.  If any of your floors are uneven, fix them.  If there are any pets around you, be aware of where they are.  Review your medicines with your doctor. Some medicines can make you feel dizzy. This can increase your chance of falling. Ask your doctor what other things that you can do to help prevent falls. This information is not intended to replace advice given to you by your health care provider. Make sure you discuss any questions you have with your health care provider. Document Released: 08/02/2009 Document  Revised: 03/13/2016 Document Reviewed: 11/10/2014 Elsevier Interactive Patient Education  2017 [employer].

## 2020-02-06 NOTE — Addendum Note (Signed)
Addended by: Sheral Flow on: 02/06/2020 02:41 PM   Modules accepted: Miquel Dunn

## 2020-02-06 NOTE — Progress Notes (Addendum)
This visit is being conducted via phone call due to the COVID-19 pandemic. This patient has given me verbal consent via phone to conduct this visit, patient states they are participating from their home address. Some vital signs may be absent or patient reported.   Patient identification: identified by name, DOB, and current address.  Location provider: [facility], Office Persons participating in the virtual visit: Patient, Lisette Abu, LPN and Dr. [name]    Subjective:   Jasmine [name] is a 74 y.o. female who presents for Medicare Annual (Subsequent) preventive examination.  Review of Systems:  No ROS. Medicare Wellness Visit Cardiac Risk Factors include: advanced age (>34men, >53 women);dyslipidemia;family history of premature cardiovascular disease  Sleep Patterns: No issues with falling sleep; feels rested on waking; sleeps 8+ hours per night; use of Bi-Pap machine Home Safety/Smoke Alarms: Feels safe in home; Smoke alarms in place. Living environment: Lives in a 1-level home with husband. Seat Belt Safety/Bike Helmet: Wears seat belt.     Objective:     Vitals: There were no vitals taken for this visit.  There is no height or weight on file to calculate BMI.  Advanced Directives 02/06/2020 01/29/2020 12/13/2019 08/22/2019 08/16/2019 08/15/2019 07/29/2019  Does Patient Have a Medical Advance Directive? Yes Yes Yes Yes - Yes Yes  Type of Advance Directive [place];Living will Out of facility DNR (pink MOST or yellow form) Power;Living will [place];Living will [place];Living will - [place];Living will  Does patient want to make changes to medical advance directive? No - Patient declined No - Patient declined No - Patient declined No - Patient declined - No - Patient declined No - Patient declined  Copy of [place] in Chart? No - copy requested - No -  copy requested No - copy requested No - copy requested - No - copy requested  Would patient like information on creating a medical advance directive? - - - - - - -  Pre-existing out of facility DNR order (yellow form or pink MOST form) - - - - - - -    Tobacco Social History   Tobacco Use  Smoking Status Former Smoker  . Packs/day: 1.00  . Years: 52.00  . Pack years: 52.00  . Types: Cigarettes  . Start date: 89  . Quit date: 07/20/2016  . Years since quitting: 3.5  Smokeless Tobacco Never Used     Counseling given: No   Clinical Intake:  Pre-visit preparation completed: Yes  Pain : 0-10 Pain Score: 3  Pain Type: Chronic pain Pain Location: Back Pain Descriptors / Indicators: Discomfort Pain Onset: More than a month ago Pain Frequency: Intermittent Pain Relieving Factors: Tramadol  Pain Relieving Factors: Tramadol  Diabetes: No  How often do you need to have someone help you when you read instructions, pamphlets, or other written materials from your doctor or pharmacy?: 1 - Never  Interpreter Needed?: No  Information entered by :: Sheral Flow, LPN  Past Medical History:  Diagnosis Date  . ALLERGIC RHINITIS 05/01/2007  . Anxiety   . Arthritis    "hands" (12/28/2015)  . ASTHMA 05/01/2007   "since I was a child"  . Cancer (HCC)    LUNG  . CHF (congestive heart failure) ([place])   . Chronic bronchitis ([place])   . COPD (chronic obstructive pulmonary disease) ([place])   . DEPRESSION 07/21/2008  . DVT (deep venous thrombosis) ([place])  1960s   LLE  . FATIGUE 10/10/2010  . HYPERTENSION 05/01/2007  . HYPOTHYROIDISM 08/23/2009   s/p RAI  . LUMBAR RADICULOPATHY, RIGHT 08/25/2008  . On home oxygen therapy    "3-4L; qd; all the time" (12/28/2015)  . OSTEOPOROSIS 05/01/2007  . SHOULDER PAIN, LEFT 08/23/2009  . SINUSITIS, CHRONIC 10/10/2010   Past Surgical History:  Procedure Laterality Date  . APPENDECTOMY    . DILATION AND CURETTAGE OF UTERUS  multiple   history of  multiple dialations and curettages and miscarriages, unfortunately never carrying a child to term  . ECTOPIC PREGNANCY SURGERY  "late '60s or early '70s  . ELECTROCARDIOGRAM  06/20/2006  . OOPHORECTOMY Right    Family History  Problem Relation Age of Onset  . Lung cancer Father   . Alcohol abuse Brother   . Diabetes Brother   . Hypertension Other   . Stroke Mother   . Thyroid disease Mother   . Asthma Other        maternal aunts  . Breast cancer Neg Hx    Social History   Socioeconomic History  . Marital status: Significant Other    Spouse name: Not on file  . Number of children: 0  . Years of education: Not on file  . Highest education level: Not on file  Occupational History  . Occupation: RETIRED - Facilities manager: [employer]  Tobacco Use  . Smoking status: Former Smoker    Packs/day: 1.00    Years: 52.00    Pack years: 52.00    Types: Cigarettes    Start date: 1965    Quit date: 07/20/2016    Years since quitting: 3.5  . Smokeless tobacco: Never Used  Substance and Sexual Activity  . Alcohol use: Not Currently  . Drug use: Not Currently  . Sexual activity: Not Currently    Partners: Male    Birth control/protection: Post-menopausal  Other Topics Concern  . Not on file  Social History Narrative   Lives in [place], [place]. Lives with husband, no children, but husband has 3 kids. Ambulatory, without cane or walker. Smokes cigarettes, currently 1 ppd, smoked for >20-30 yrs. Drinks beer, ~2/day, occasionally liquor, but ? suspect minimizing. Occasional MJ and endorses crack cocaine occasionally   Social Determinants of Health   Financial Resource Strain:   . Difficulty of Paying Living Expenses:   Food Insecurity: No Food Insecurity  . Worried About Charity fundraiser in the Last Year: Never true  . Ran Out of Food in the Last Year: Never true  Transportation Needs: No Transportation Needs  . Lack of Transportation (Medical): No  . Lack  of Transportation (Non-Medical): No  Physical Activity:   . Days of Exercise per Week:   . Minutes of Exercise per Session:   Stress:   . Feeling of Stress :   Social Connections: Unknown  . Frequency of Communication with Friends and Family: Not asked  . Frequency of Social Gatherings with Friends and Family: Not asked  . Attends Religious Services: Not asked  . Active Member of Clubs or Organizations: Not asked  . Attends Archivist Meetings: Not asked  . Marital Status: Not asked    Outpatient Encounter Medications as of 02/06/2020  Medication Sig  . albuterol (PROVENTIL) (2.5 MG/3ML) 0.083% nebulizer solution Take 3 mLs (2.5 mg total) by nebulization every 6 (six) hours as needed for wheezing or shortness of breath.  [name] albuterol (VENTOLIN HFA) 108 (90  Base) MCG/ACT inhaler INHALE 1 TO 2 PUFFS INTO THE LUNGS EVERY 6 (SIX) HOURS AS NEEDED FOR WHEEZING OR SHORTNESS OF BREATH. (Patient taking differently: Inhale 2 puffs into the lungs every 6 (six) hours as needed for shortness of breath. )  . aspirin EC 81 MG EC tablet Take 1 tablet (81 mg total) by mouth daily.  [name] atorvastatin (LIPITOR) 10 MG tablet TAKE ONE TABLET BY MOUTH IN THE EVENING (Patient taking differently: Take 10 mg by mouth daily at 6 PM. TAKE ONE TABLET BY MOUTH IN THE EVENING)  . citalopram (CELEXA) 20 MG tablet NEW PRESCRIPTION REQUEST: CITALOPRAM 20MG  - TAKE ONE TABLET BY MOUTH DAILY (Patient taking differently: Take 20 mg by mouth daily. )  . fluticasone furoate-vilanterol (BREO ELLIPTA) 200-25 MCG/INH AEPB Inhale 1 puff into the lungs daily.  . furosemide (LASIX) 40 MG tablet NEW PRESCRIPTION REQUEST: FUROSEMIDE 40MG  - TAKE ONE TABLET BY MOUTH IN THE MORNING AND TAKE ONE TABLET BY MOUTH AS NEEDED IN THE EVENING (Patient taking differently: Take 40 mg by mouth 2 (two) times daily. )  . levothyroxine (SYNTHROID) 75 MCG tablet NEW PRESCRIPTION REQUEST: LEVOTHYROXINE 75MCG - TAKE ONE TABLET BY MOUTH EARLY MORNING  (Patient taking differently: Take 75 mcg by mouth daily. )  . OXYGEN Inhale 3 L into the lungs. 24/7  . potassium chloride (KLOR-CON) 10 MEQ tablet NEW PRESCRIPTION REQUEST: KLOR-CON 10MEQ - TAKE ONE TABLET BY MOUTH TWICE DAILY (Patient taking differently: Take 10 mEq by mouth 2 (two) times daily. )  . predniSONE (DELTASONE) 10 MG tablet Take 4 tablets (40 mg total) by mouth daily. 4 tablets daily x 2 days then 3 daily x 2 days then 2 daily x 2 days then 1 daily x 2 days  . tiotropium (SPIRIVA HANDIHALER) 18 MCG inhalation capsule NEW PRESCRIPTION REQUEST: SPIRIVA 18MCG HANDIHALER - TAKE TWO INHALATIONS OF THE CONTENTS OF ONE CAPSULE BY ONCE DAILY (Patient taking differently: Place 18 mcg into inhaler and inhale daily. )  . traMADol (ULTRAM) 50 MG tablet Take 1 tablet (50 mg total) by mouth 2 (two) times daily as needed. (Patient taking differently: Take 50 mg by mouth 2 (two) times daily as needed for moderate pain. )   No facility-administered encounter medications on file as of 02/06/2020.    Activities of Daily Living In your present state of health, do you have any difficulty performing the following activities: 02/06/2020 01/29/2020  Hearing? N N  Vision? N N  Difficulty concentrating or making decisions? N N  Comment - -  Walking or climbing stairs? N N  Comment - -  Dressing or bathing? N N  Doing errands, shopping? N Y  Conservation officer, nature and eating ? N -  Using the Toilet? N -  In the past six months, have you accidently leaked urine? Y -  Comment wears a pantyliner -  Do you have problems with loss of bowel control? N -  Managing your Medications? N -  Managing your Finances? N -  Housekeeping or managing your Housekeeping? Y -  Comment needs assistance -  Some recent data might be hidden    Patient Care Team: Biagio Borg, MD as PCP - General Renato Shin, MD as Attending Physician (Internal Medicine) Rigoberto Noel, MD as Consulting Physician (Pulmonary Disease) Jon Billings, RN as [place] Management    Assessment:   This is a routine wellness examination for [place].  Exercise Activities and Dietary recommendations Current Exercise Habits: The patient  does not participate in regular exercise at present, Exercise limited by: orthopedic condition(s);Other - see comments;cardiac condition(s)(pulmonology issues)  Goals    . Patient Stated     I want to start to do chair exercises and check into Silver Sneakers.    . Patient Stated (pt-stated)     To live life the best that I can.       Fall Risk Fall Risk  02/06/2020 12/14/2019 11/30/2019 08/22/2019 07/29/2019  Falls in the past year? 0 0 0 0 0  Number falls in past yr: 0 - - - 0  Comment - - - - -  Injury with Fall? 0 - - - -  Risk Factor Category  - - - - -  Risk for fall due to : Impaired balance/gait - - - -  Follow up Falls evaluation completed;Education provided;Falls prevention discussed - - - -   Is the patient's home free of loose throw rugs in walkways, pet beds, electrical cords, etc?   yes      Grab bars in the bathroom? yes      Handrails on the stairs?   yes      Adequate lighting?   yes   Depression Screen PHQ 2/9 Scores 02/06/2020 12/14/2019 12/13/2019 11/30/2019  PHQ - 2 Score 0 0 0 0  PHQ- 9 Score - - - -           Immunization History  Administered Date(s) Administered  . Fluad Quad(high Dose 65+) 08/30/2019  . Influenza Split 07/10/2011, 08/18/2012  . Influenza Whole 07/21/2008, 08/23/2009, 10/10/2010  . Influenza, High Dose Seasonal PF 07/10/2016, 09/03/2017  . Influenza,inj,Quad PF,6+ Mos 08/12/2013, 07/10/2014, 07/08/2018  . PFIZER SARS-COV-2 Vaccination 12/24/2019, 01/14/2020  . Pneumococcal Conjugate-13 11/30/2014  . Pneumococcal Polysaccharide-23 07/21/2007, 01/12/2013, 06/16/2018  . Td 08/23/2009  . Zoster 07/20/2006    Qualifies for Shingles Vaccine? Yes, will check with local pharmacy.  Screening Tests Health Maintenance  Topic  Date Due  . DEXA SCAN  11/29/2020 (Originally 11/07/2009)  . COLONOSCOPY  11/29/2020 (Originally 07/19/2013)  . TETANUS/TDAP  11/29/2020 (Originally 08/24/2019)  . INFLUENZA VACCINE  05/20/2020  . COVID-19 Vaccine  Completed  . Hepatitis C Screening  Completed  . PNA vac Low Risk Adult  Completed    Cancer Screenings: Lung: Low Dose CT Chest recommended if Age 33-80 years, 30 pack-year currently smoking OR have quit w/in 15years. Patient does qualify. Breast:  Up to date on Mammogram? Yes   Up to date of Bone Density/Dexa? No Colorectal: Yes     Plan:     Reviewed health maintenance screenings with patient today and relevant education, vaccines, and/or referrals were provided.    Continue doing brain stimulating activities (puzzles, reading, adult coloring books, staying active) to keep memory sharp.    Continue to eat heart healthy diet (full of fruits, vegetables, whole grains, lean protein, water--limit salt, fat, and sugar intake) and increase physical activity as tolerated.  I have personally reviewed and noted the following in the patient's chart:   . Medical and social history . Use of alcohol, tobacco or illicit drugs  . Current medications and supplements . Functional ability and status . Nutritional status . Physical activity . Advanced directives . List of other physicians . Hospitalizations, surgeries, and ER visits in previous 12 months . Vitals . Screenings to include cognitive, depression, and falls . Referrals and appointments  In addition, I have reviewed and discussed with patient certain preventive protocols, quality metrics, and best practice  recommendations. A written personalized care plan for preventive services as well as general preventive health recommendations were provided to patient.     Sheral Flow, LPN  [id number]  Nurse Health Advisor    Medical screening examination/treatment/procedure(s) were performed by non-physician practitioner  and as supervising physician I was immediately available for consultation/collaboration. I agree with above. [name], MD

## 2020-02-06 NOTE — Patient Outreach (Signed)
[facility]) Care Management  02/06/2020  Jasmine [name] 11-05-1945 [id number]   Telephone call to patient for follow up hospitalization. No answer.  Unable to leave a voice mail.    Plan: RN CM will attempt patient again within 4 business days.  [name], RN, MSN [place] Management Care Management Coordinator Direct Line [phone] Cell [phone] Toll Free: [phone]  Fax: [phone]

## 2020-02-06 NOTE — Telephone Encounter (Signed)
error 

## 2020-02-07 ENCOUNTER — Other Ambulatory Visit: Payer: Self-pay

## 2020-02-07 NOTE — Patient Outreach (Addendum)
[facility]) Care Management  [place]  02/07/2020   Jasmine [name] 1946-01-27 [id number]  Subjective: Telephone call to patient ex-spouse as patient phone going to voice mail.  Spoke with patient. She called CM back on her phone.  Patient reports she is doing good since being home.  Recent hospitalization 01-29-20 to 02-02-20 for COPD exacerbation related to non-compliance with bipap.  Patient states she is wearing her bipap regularly.  She states that [facility] came by on yesterday and went over machine and she feels comfortable with using and cleaning bipap machine.  Stressed the importance of using bipap regularly in order avoid hospitalization.  She verbalized understanding. She states that ex-spouse lives with her and helps her out iADL's.  She uses a pill planner for her medications and offers no concerns with medications.  Discussed with patient her COPD, exacerbation, and oxygen use.  She verbalized understanding.    Objective:   Encounter Medications:  Outpatient Encounter Medications as of 02/07/2020  Medication Sig  . albuterol (PROVENTIL) (2.5 MG/3ML) 0.083% nebulizer solution Take 3 mLs (2.5 mg total) by nebulization every 6 (six) hours as needed for wheezing or shortness of breath.  [name] albuterol (VENTOLIN HFA) 108 (90 Base) MCG/ACT inhaler INHALE 1 TO 2 PUFFS INTO THE LUNGS EVERY 6 (SIX) HOURS AS NEEDED FOR WHEEZING OR SHORTNESS OF BREATH. (Patient taking differently: Inhale 2 puffs into the lungs every 6 (six) hours as needed for shortness of breath. )  . aspirin EC 81 MG EC tablet Take 1 tablet (81 mg total) by mouth daily.  [name] atorvastatin (LIPITOR) 10 MG tablet TAKE ONE TABLET BY MOUTH IN THE EVENING (Patient taking differently: Take 10 mg by mouth daily at 6 PM. TAKE ONE TABLET BY MOUTH IN THE EVENING)  . citalopram (CELEXA) 20 MG tablet NEW PRESCRIPTION REQUEST: CITALOPRAM 20MG  - TAKE ONE TABLET BY MOUTH DAILY (Patient taking differently: Take 20 mg by  mouth daily. )  . fluticasone furoate-vilanterol (BREO ELLIPTA) 200-25 MCG/INH AEPB Inhale 1 puff into the lungs daily.  . furosemide (LASIX) 40 MG tablet NEW PRESCRIPTION REQUEST: FUROSEMIDE 40MG  - TAKE ONE TABLET BY MOUTH IN THE MORNING AND TAKE ONE TABLET BY MOUTH AS NEEDED IN THE EVENING (Patient taking differently: Take 40 mg by mouth 2 (two) times daily. )  . levothyroxine (SYNTHROID) 75 MCG tablet NEW PRESCRIPTION REQUEST: LEVOTHYROXINE 75MCG - TAKE ONE TABLET BY MOUTH EARLY MORNING (Patient taking differently: Take 75 mcg by mouth daily. )  . OXYGEN Inhale 3 L into the lungs. 24/7  . potassium chloride (KLOR-CON) 10 MEQ tablet NEW PRESCRIPTION REQUEST: KLOR-CON 10MEQ - TAKE ONE TABLET BY MOUTH TWICE DAILY (Patient taking differently: Take 10 mEq by mouth 2 (two) times daily. )  . predniSONE (DELTASONE) 10 MG tablet Take 4 tablets (40 mg total) by mouth daily. 4 tablets daily x 2 days then 3 daily x 2 days then 2 daily x 2 days then 1 daily x 2 days  . tiotropium (SPIRIVA HANDIHALER) 18 MCG inhalation capsule NEW PRESCRIPTION REQUEST: SPIRIVA 18MCG HANDIHALER - TAKE TWO INHALATIONS OF THE CONTENTS OF ONE CAPSULE BY ONCE DAILY (Patient taking differently: Place 18 mcg into inhaler and inhale daily. )  . traMADol (ULTRAM) 50 MG tablet Take 1 tablet (50 mg total) by mouth 2 (two) times daily as needed. (Patient taking differently: Take 50 mg by mouth 2 (two) times daily as needed for moderate pain. )   No facility-administered encounter medications on file as  of 02/07/2020.    Functional Status:  In your present state of health, do you have any difficulty performing the following activities: 02/07/2020 02/06/2020  Hearing? N N  Vision? N N  Difficulty concentrating or making decisions? N N  Comment - -  Walking or climbing stairs? N N  Comment - -  Dressing or bathing? N N  Doing errands, shopping? N N  Preparing Food and eating ? N N  Using the Toilet? N N  In the past six months, have you  accidently leaked urine? Y Y  Comment occasional incontinence wears a pantyliner  Do you have problems with loss of bowel control? N N  Managing your Medications? N N  Managing your Finances? N N  Housekeeping or managing your Housekeeping? Y Y  Comment ex-spouse assists needs assistance  Some recent data might be hidden    Fall/Depression Screening: Fall Risk  02/07/2020 02/06/2020 12/14/2019  Falls in the past year? 0 0 0  Number falls in past yr: - 0 -  Comment - - -  Injury with Fall? - 0 -  Risk Factor Category  - - -  Risk for fall due to : - Impaired balance/gait -  Follow up - Falls evaluation completed;Education provided;Falls prevention discussed -   PHQ 2/9 Scores 02/07/2020 02/06/2020 12/14/2019 12/13/2019 11/30/2019 08/22/2019 07/29/2019  PHQ - 2 Score 0 0 0 0 0 0 0  PHQ- 9 Score - - - - - - -    Assessment: Patient with recent discharge from the hospital. Patient adjusting to being back home.  Patient reports compliance with bipap.    Plan:  [facility] CM Care Plan Problem One     Most Recent Value  Care Plan Problem One  Knowledge deficit related to COPD  Role Documenting the Problem One  Care Management Telephonic Coordinator  Care Plan for Problem One  Active  [facility] Long Term Goal   Patient will continue to follow COPD regimen and not have any excerbations within the next 90 days.  [facility] Long Term Goal Start Date  02/07/20  Interventions for Problem One Long Term Goal  Patient compliant with bipap.  Educated on importance of compliance with Bipap.  Discussed with patient signs of worsening COPD and when to notify physician.        [facility] CM Care Plan Problem Two     Most Recent Value  Care Plan Problem [facility] Admission  Role Documenting the Problem Two  Care Management Telephonic Coordinator  Care Plan for Problem Two  Active  Interventions for Problem Two Long Term Goal    Discussed with patient current clinical condition since hospital discharge, confirmed that  patient has all medications and denies concerns around medications, reviewed post-hospital discharge instructions with patient and confirmed that patient has reliable transportation to all scheduled provider appointments post-hospital discharge.  [facility] Long Term Goal  Patient will not experience unplanned hospital readmission within the next 31 days  [facility] Long Term Goal Start Date  02/07/20  [facility] CM Short Term Goal #1   Patient will follow up with pulmonologist within the next two weeks.    [facility] CM Short Term Goal #1 Start Date  02/07/20  Interventions for Short Term Goal #2   Discussed with patient the importance of hospital follow up. Patient has transportation to appointment.   [facility] CM Short Term Goal #2   Patient will report wearing bipap nightly within 14 days.    [facility] CM Short  Term Goal #2 Start Date  02/07/20  Interventions for Short Term Goal #2  Encouraged patient to wear bipap nightly to avoid COPD exacerbation and hospitalization.      RN CM will contact patient in the first week in May and patient agreeable.    [name], RN, MSN [place] Management Care Management Coordinator Direct Line [phone] Cell [phone] Toll Free: [phone]  Fax: [phone]

## 2020-02-09 ENCOUNTER — Telehealth: Payer: Self-pay | Admitting: Internal Medicine

## 2020-02-09 DIAGNOSIS — M19042 Primary osteoarthritis, left hand: Secondary | ICD-10-CM | POA: Diagnosis not present

## 2020-02-09 DIAGNOSIS — M19041 Primary osteoarthritis, right hand: Secondary | ICD-10-CM | POA: Diagnosis not present

## 2020-02-09 DIAGNOSIS — I5032 Chronic diastolic (congestive) heart failure: Secondary | ICD-10-CM | POA: Diagnosis not present

## 2020-02-09 DIAGNOSIS — G8929 Other chronic pain: Secondary | ICD-10-CM | POA: Diagnosis not present

## 2020-02-09 DIAGNOSIS — I110 Hypertensive heart disease with heart failure: Secondary | ICD-10-CM | POA: Diagnosis not present

## 2020-02-09 DIAGNOSIS — M170 Bilateral primary osteoarthritis of knee: Secondary | ICD-10-CM | POA: Diagnosis not present

## 2020-02-09 DIAGNOSIS — J441 Chronic obstructive pulmonary disease with (acute) exacerbation: Secondary | ICD-10-CM | POA: Diagnosis not present

## 2020-02-09 DIAGNOSIS — M5416 Radiculopathy, lumbar region: Secondary | ICD-10-CM | POA: Diagnosis not present

## 2020-02-09 DIAGNOSIS — I10 Essential (primary) hypertension: Secondary | ICD-10-CM | POA: Diagnosis not present

## 2020-02-09 DIAGNOSIS — J9622 Acute and chronic respiratory failure with hypercapnia: Secondary | ICD-10-CM | POA: Diagnosis not present

## 2020-02-09 NOTE — Telephone Encounter (Signed)
Ok for verbals as reqeusted

## 2020-02-09 NOTE — Telephone Encounter (Signed)
Sent information to [name] to advise.

## 2020-02-09 NOTE — Telephone Encounter (Signed)
New message:   Jasmine [name] is calling to receive verbal orders for continued PT for 1x a week for 1 week, 2x a week for 3 weeks, and 1x a week for 3 weeks. He also needs a home health nurse for back and shower assistance and a nurse evaluation on medication. Please advise.

## 2020-02-10 NOTE — Telephone Encounter (Signed)
Spoke with [employer] and given verbal orders per [name] for for continued PT for 1x a week for 1 week, 2x a week for 3 weeks, and 1x a week for 3 weeks. He also needs a home health nurse for back and shower assistance and a nurse evaluation on medication.

## 2020-02-13 ENCOUNTER — Encounter: Payer: Self-pay | Admitting: Adult Health

## 2020-02-13 ENCOUNTER — Ambulatory Visit (INDEPENDENT_AMBULATORY_CARE_PROVIDER_SITE_OTHER): Payer: Medicare HMO | Admitting: Adult Health

## 2020-02-13 ENCOUNTER — Other Ambulatory Visit: Payer: Self-pay

## 2020-02-13 DIAGNOSIS — J441 Chronic obstructive pulmonary disease with (acute) exacerbation: Secondary | ICD-10-CM | POA: Diagnosis not present

## 2020-02-13 DIAGNOSIS — J432 Centrilobular emphysema: Secondary | ICD-10-CM | POA: Diagnosis not present

## 2020-02-13 DIAGNOSIS — M19041 Primary osteoarthritis, right hand: Secondary | ICD-10-CM | POA: Diagnosis not present

## 2020-02-13 DIAGNOSIS — M5416 Radiculopathy, lumbar region: Secondary | ICD-10-CM | POA: Diagnosis not present

## 2020-02-13 DIAGNOSIS — J9611 Chronic respiratory failure with hypoxia: Secondary | ICD-10-CM | POA: Diagnosis not present

## 2020-02-13 DIAGNOSIS — I5032 Chronic diastolic (congestive) heart failure: Secondary | ICD-10-CM | POA: Diagnosis not present

## 2020-02-13 DIAGNOSIS — J9622 Acute and chronic respiratory failure with hypercapnia: Secondary | ICD-10-CM | POA: Diagnosis not present

## 2020-02-13 DIAGNOSIS — G8929 Other chronic pain: Secondary | ICD-10-CM | POA: Diagnosis not present

## 2020-02-13 DIAGNOSIS — M170 Bilateral primary osteoarthritis of knee: Secondary | ICD-10-CM | POA: Diagnosis not present

## 2020-02-13 DIAGNOSIS — I110 Hypertensive heart disease with heart failure: Secondary | ICD-10-CM | POA: Diagnosis not present

## 2020-02-13 DIAGNOSIS — M19042 Primary osteoarthritis, left hand: Secondary | ICD-10-CM | POA: Diagnosis not present

## 2020-02-13 NOTE — Assessment & Plan Note (Addendum)
Currently compensated on present oxygen use. And Trilogy  Encouraged on compliance  Plan  Patient Instructions  Continue on Spiriva 1 puff daily  Continue on BREO daily  Rinse after use.  May use Duoneb nebs every 6hr As needed  Wheezing /shortness of breath .  Continue on non-invasive Vent (TRILOGY )  At bedtime  And with naps.  Continue on Oxygen 3l/m rest and 4l/m with activity .   Follow up Dr. Elsworth Soho  In 81-months and  As needed    Please contact office for sooner follow up if symptoms do not improve or worsen or seek emergency care

## 2020-02-13 NOTE — Patient Instructions (Addendum)
Continue on Spiriva 1 puff daily  Continue on BREO daily  Rinse after use.  May use Duoneb nebs every 6hr As needed  Wheezing /shortness of breath .  Continue on non-invasive Vent (TRILOGY )  At bedtime  And with naps.  Continue on Oxygen 3l/m rest and 4l/m with activity .   Follow up Dr. Elsworth Soho  In 20-months and  As needed    Please contact office for sooner follow up if symptoms do not improve or worsen or seek emergency care

## 2020-02-13 NOTE — Assessment & Plan Note (Signed)
Patient is prone to frequent COPD exacerbations with associated hypercarbic and hypoxic respiratory failure.  From recent COPD flare she has now improved and back to baseline. Encouraged on Trilogy nocturnal use.  Plan  Patient Instructions  Continue on Spiriva 1 puff daily  Continue on BREO daily  Rinse after use.  May use Duoneb nebs every 6hr As needed  Wheezing /shortness of breath .  Continue on non-invasive Vent (TRILOGY )  At bedtime  And with naps.  Continue on Oxygen 3l/m rest and 4l/m with activity .   Follow up Dr. Elsworth Soho  In 81-months and  As needed    Please contact office for sooner follow up if symptoms do not improve or worsen or seek emergency care

## 2020-02-13 NOTE — Progress Notes (Signed)
@Patient  ID: Jasmine [name], female    DOB: Dec 23, 1945, 74 y.o.   MRN: [id number]  Chief Complaint  Patient presents with  . Follow-up    COPD     Referring provider: Biagio Borg, MD  HPI: 74 year old female former smoker followed for very severe COPD, chronic hypoxic and hypercarbic respiratory failure and lung nodules.  She is on nocturnal TRILOGY .  She has been oxygen dependent since March 2014.  She is maintained on 3 L of oxygen at rest and 4 L of oxygen with activity. She quit smoking in 2016. History of polysubstance abuse with cocaine  TEST/EVENTS :  CT chest 10/2011 >>showed Moderate centrilobular emphysema. Small bilateral pleural effusions with dependent atelectasis  CT chest 07/2016-resolving right upper lobe consolidation,spiculated subcentimeter nodules that have been noted before with low-grade PET hypermetabolism CT chest February 2019 stable right upper lobe scarring, stable bilateral pulmonary nodules, emphysema  CT chest  11/2018>> stable nodules  PFT >FEV1 at 0.47 L( 30%), ratio 39 , No BD response (<200cc)  02/13/2020 Follow up : COPD ,O2 RF, Lung nodules.  Patient presents for a follow-up visit.  She was last seen in the office in March 2020.  Epic chart review shows she has been hospitalized 4 times over the last year for respiratory issues.  She was most recently hospitalized 2 weeks ago.  For acute on chronic hypercarbic and hypoxic respiratory failure she was treated with antibiotics and steroids.  It was noted that she did not have  Compliance with Trilogy .  On admission PCO2 was 107 and pH was 7.23.  Since discharge she is feeling better.  She has decreased cough and shortness of breath.  She says she is doing better with her nocturnal trilogy device.  She is trying to wear it every night.  She has gotten a so clean machine which seems to be helping. She denies any increased wheezing or shortness of breath.  Activity tolerance is at baseline.   Patient does report that she did not take the prednisone taper as directed at discharge.  She says she has not had any increased cough or wheezing..    Allergies  Allergen Reactions  . Alendronate Sodium Other (See Comments)    Patient does not remember this reaction    Immunization History  Administered Date(s) Administered  . Fluad Quad(high Dose 65+) 08/30/2019  . Influenza Split 07/10/2011, 08/18/2012  . Influenza Whole 07/21/2008, 08/23/2009, 10/10/2010  . Influenza, High Dose Seasonal PF 07/10/2016, 09/03/2017  . Influenza,inj,Quad PF,6+ Mos 08/12/2013, 07/10/2014, 07/08/2018  . PFIZER SARS-COV-2 Vaccination 12/24/2019, 01/14/2020  . Pneumococcal Conjugate-13 11/30/2014  . Pneumococcal Polysaccharide-23 07/21/2007, 01/12/2013, 06/16/2018  . Td 08/23/2009  . Zoster 07/20/2006    Past Medical History:  Diagnosis Date  . ALLERGIC RHINITIS 05/01/2007  . Anxiety   . Arthritis    "hands" (12/28/2015)  . ASTHMA 05/01/2007   "since I was a child"  . Cancer (HCC)    LUNG  . CHF (congestive heart failure) ([place])   . Chronic bronchitis ([place])   . COPD (chronic obstructive pulmonary disease) ([place])   . DEPRESSION 07/21/2008  . DVT (deep venous thrombosis) (HCC) 1960s   LLE  . FATIGUE 10/10/2010  . HYPERTENSION 05/01/2007  . HYPOTHYROIDISM 08/23/2009   s/p RAI  . LUMBAR RADICULOPATHY, RIGHT 08/25/2008  . On home oxygen therapy    "3-4L; qd; all the time" (12/28/2015)  . OSTEOPOROSIS 05/01/2007  . SHOULDER PAIN, LEFT 08/23/2009  . SINUSITIS, CHRONIC 10/10/2010  Tobacco History: Social History   Tobacco Use  Smoking Status Former Smoker  . Packs/day: 1.00  . Years: 52.00  . Pack years: 52.00  . Types: Cigarettes  . Start date: 16  . Quit date: 07/20/2016  . Years since quitting: 3.5  Smokeless Tobacco Never Used   Counseling given: Not Answered   Outpatient Medications Prior to Visit  Medication Sig Dispense Refill  . albuterol (PROVENTIL) (2.5 MG/3ML) 0.083%  nebulizer solution Take 3 mLs (2.5 mg total) by nebulization every 6 (six) hours as needed for wheezing or shortness of breath. 75 mL 0  . albuterol (VENTOLIN HFA) 108 (90 Base) MCG/ACT inhaler INHALE 1 TO 2 PUFFS INTO THE LUNGS EVERY 6 (SIX) HOURS AS NEEDED FOR WHEEZING OR SHORTNESS OF BREATH. (Patient taking differently: Inhale 2 puffs into the lungs every 6 (six) hours as needed for shortness of breath. ) 18 g 5  . aspirin EC 81 MG EC tablet Take 1 tablet (81 mg total) by mouth daily. 30 tablet 0  . atorvastatin (LIPITOR) 10 MG tablet TAKE ONE TABLET BY MOUTH IN THE EVENING (Patient taking differently: Take 10 mg by mouth daily at 6 PM. TAKE ONE TABLET BY MOUTH IN THE EVENING) 90 tablet 0  . citalopram (CELEXA) 20 MG tablet NEW PRESCRIPTION REQUEST: CITALOPRAM 20MG  - TAKE ONE TABLET BY MOUTH DAILY (Patient taking differently: Take 20 mg by mouth daily. ) 90 tablet 3  . fluticasone furoate-vilanterol (BREO ELLIPTA) 200-25 MCG/INH AEPB Inhale 1 puff into the lungs daily. 1 each 5  . furosemide (LASIX) 40 MG tablet NEW PRESCRIPTION REQUEST: FUROSEMIDE 40MG  - TAKE ONE TABLET BY MOUTH IN THE MORNING AND TAKE ONE TABLET BY MOUTH AS NEEDED IN THE EVENING (Patient taking differently: Take 40 mg by mouth 2 (two) times daily. ) 180 tablet 3  . levothyroxine (SYNTHROID) 75 MCG tablet NEW PRESCRIPTION REQUEST: LEVOTHYROXINE 75MCG - TAKE ONE TABLET BY MOUTH EARLY MORNING (Patient taking differently: Take 75 mcg by mouth daily. ) 90 tablet 3  . OXYGEN Inhale 3 L into the lungs. 24/7    . potassium chloride (KLOR-CON) 10 MEQ tablet NEW PRESCRIPTION REQUEST: KLOR-CON 10MEQ - TAKE ONE TABLET BY MOUTH TWICE DAILY (Patient taking differently: Take 10 mEq by mouth 2 (two) times daily. ) 180 tablet 3  . tiotropium (SPIRIVA HANDIHALER) 18 MCG inhalation capsule NEW PRESCRIPTION REQUEST: SPIRIVA 18MCG HANDIHALER - TAKE TWO INHALATIONS OF THE CONTENTS OF ONE CAPSULE BY ONCE DAILY (Patient taking differently: Place 18 mcg into  inhaler and inhale daily. ) 90 capsule 3  . traMADol (ULTRAM) 50 MG tablet Take 1 tablet (50 mg total) by mouth 2 (two) times daily as needed. (Patient taking differently: Take 50 mg by mouth 2 (two) times daily as needed for moderate pain. ) 60 tablet 2  . predniSONE (DELTASONE) 10 MG tablet Take 4 tablets (40 mg total) by mouth daily. 4 tablets daily x 2 days then 3 daily x 2 days then 2 daily x 2 days then 1 daily x 2 days (Patient not taking: Reported on 02/13/2020) 20 tablet 0   No facility-administered medications prior to visit.     Review of Systems:   Constitutional:   No  weight loss, night sweats,  Fevers, chills, + fatigue, or  lassitude.  HEENT:   No headaches,  Difficulty swallowing,  Tooth/dental problems, or  Sore throat,                No sneezing, itching, ear ache, nasal  congestion, post nasal drip,   CV:  No chest pain,  Orthopnea, PND, swelling in lower extremities, anasarca, dizziness, palpitations, syncope.   GI  No heartburn, indigestion, abdominal pain, nausea, vomiting, diarrhea, change in bowel habits, loss of appetite, bloody stools.   Resp:  .  No chest wall deformity  Skin: no rash or lesions.  GU: no dysuria, change in color of urine, no urgency or frequency.  No flank pain, no hematuria   MS:  No joint pain or swelling.  No decreased range of motion.  No back pain.    Physical Exam  BP 136/78 (BP Location: Left Arm, Cuff Size: Normal)   Pulse 78   Ht 5' (1.524 m)   Wt 116 lb (52.6 kg)   SpO2 95%   BMI 22.65 kg/m   GEN: A/Ox3; pleasant , NAD,    HEENT:  [place]/AT,   NOSE-clear, THROAT-clear, no lesions, no postnasal drip or exudate noted.   NECK:  Supple w/ fair ROM; no JVD; normal carotid impulses w/o bruits; no thyromegaly or nodules palpated; no lymphadenopathy.    RESP  Clear  P & A; w/o, wheezes/ rales/ or rhonchi. no accessory muscle use, no dullness to percussion  CARD:  RRR, no m/r/g, no  peripheral edema, pulses intact, no cyanosis or  clubbing.  GI:   Soft & nt; nml bowel sounds; no organomegaly or masses detected.   Musco: Warm bil, no deformities or joint swelling noted.   Neuro: alert, no focal deficits noted.    Skin: Warm, no lesions or rashes    Lab Results:   BNP  Imaging: DG Chest Portable 1 View  Result Date: 01/29/2020 CLINICAL DATA:  Initial evaluation for acute hypoxia. EXAM: PORTABLE CHEST 1 VIEW COMPARISON:  Prior radiograph from 12/04/2019. FINDINGS: Transverse heart size at the upper limits of normal. Mediastinal silhouette within normal limits. Aortic atherosclerosis. Lungs normally inflated. Oblique linear density at the right upper lobe most likely reflects scarring, stable from previous. No focal infiltrates. No edema or definite pleural effusion. No pneumothorax. No acute osseous finding. IMPRESSION: 1. Stable appearance of the chest. No active cardiopulmonary disease. 2.  Aortic Atherosclerosis (ICD10-I70.0). Electronically Signed   By: Jeannine Boga M.D.   On: 01/29/2020 05:58      No flowsheet data found.  No results found for: NITRICOXIDE      Assessment & Plan:   COPD (chronic obstructive pulmonary disease) ([place]) Patient is prone to frequent COPD exacerbations with associated hypercarbic and hypoxic respiratory failure.  From recent COPD flare she has now improved and back to baseline. Encouraged on Trilogy nocturnal use.  Plan  Patient Instructions  Continue on Spiriva 1 puff daily  Continue on BREO daily  Rinse after use.  May use Duoneb nebs every 6hr As needed  Wheezing /shortness of breath .  Continue on non-invasive Vent (TRILOGY )  At bedtime  And with naps.  Continue on Oxygen 3l/m rest and 4l/m with activity .   Follow up Dr. Elsworth Soho  In 48-months and  As needed    Please contact office for sooner follow up if symptoms do not improve or worsen or seek emergency care                Chronic respiratory failure with hypoxia ([place]) Currently compensated  on present oxygen use. And Trilogy  Encouraged on compliance  Plan  Patient Instructions  Continue on Spiriva 1 puff daily  Continue on BREO daily  Rinse after use.  May use Duoneb nebs every 6hr As needed  Wheezing /shortness of breath .  Continue on non-invasive Vent (TRILOGY )  At bedtime  And with naps.  Continue on Oxygen 3l/m rest and 4l/m with activity .   Follow up Dr. Elsworth Soho  In 36-months and  As needed    Please contact office for sooner follow up if symptoms do not improve or worsen or seek emergency care                   Rexene Edison, NP 02/13/2020

## 2020-02-14 ENCOUNTER — Ambulatory Visit: Payer: Self-pay

## 2020-02-14 ENCOUNTER — Telehealth: Payer: Self-pay | Admitting: Internal Medicine

## 2020-02-14 DIAGNOSIS — M170 Bilateral primary osteoarthritis of knee: Secondary | ICD-10-CM | POA: Diagnosis not present

## 2020-02-14 DIAGNOSIS — M19042 Primary osteoarthritis, left hand: Secondary | ICD-10-CM | POA: Diagnosis not present

## 2020-02-14 DIAGNOSIS — G8929 Other chronic pain: Secondary | ICD-10-CM | POA: Diagnosis not present

## 2020-02-14 DIAGNOSIS — J9622 Acute and chronic respiratory failure with hypercapnia: Secondary | ICD-10-CM | POA: Diagnosis not present

## 2020-02-14 DIAGNOSIS — M5416 Radiculopathy, lumbar region: Secondary | ICD-10-CM | POA: Diagnosis not present

## 2020-02-14 DIAGNOSIS — I5032 Chronic diastolic (congestive) heart failure: Secondary | ICD-10-CM | POA: Diagnosis not present

## 2020-02-14 DIAGNOSIS — I110 Hypertensive heart disease with heart failure: Secondary | ICD-10-CM | POA: Diagnosis not present

## 2020-02-14 DIAGNOSIS — J441 Chronic obstructive pulmonary disease with (acute) exacerbation: Secondary | ICD-10-CM | POA: Diagnosis not present

## 2020-02-14 DIAGNOSIS — M19041 Primary osteoarthritis, right hand: Secondary | ICD-10-CM | POA: Diagnosis not present

## 2020-02-14 NOTE — Telephone Encounter (Signed)
New message:   Linna Hoff calling from [facility] and would like to get verbal orders to have a skilled nurse come visit the patient for 1x a week for 3 weeks for COPD. Please advise.

## 2020-02-14 NOTE — Telephone Encounter (Signed)
Ok for verbals 

## 2020-02-14 NOTE — Telephone Encounter (Signed)
Left vm for Dan of verbal OK for pt to have a skilled nurse come visit the patient for 1x a week for 3 weeks for COPD.

## 2020-02-16 DIAGNOSIS — G8929 Other chronic pain: Secondary | ICD-10-CM | POA: Diagnosis not present

## 2020-02-16 DIAGNOSIS — I5032 Chronic diastolic (congestive) heart failure: Secondary | ICD-10-CM | POA: Diagnosis not present

## 2020-02-16 DIAGNOSIS — M19042 Primary osteoarthritis, left hand: Secondary | ICD-10-CM | POA: Diagnosis not present

## 2020-02-16 DIAGNOSIS — M170 Bilateral primary osteoarthritis of knee: Secondary | ICD-10-CM | POA: Diagnosis not present

## 2020-02-16 DIAGNOSIS — J9622 Acute and chronic respiratory failure with hypercapnia: Secondary | ICD-10-CM | POA: Diagnosis not present

## 2020-02-16 DIAGNOSIS — M19041 Primary osteoarthritis, right hand: Secondary | ICD-10-CM | POA: Diagnosis not present

## 2020-02-16 DIAGNOSIS — J441 Chronic obstructive pulmonary disease with (acute) exacerbation: Secondary | ICD-10-CM | POA: Diagnosis not present

## 2020-02-16 DIAGNOSIS — M5416 Radiculopathy, lumbar region: Secondary | ICD-10-CM | POA: Diagnosis not present

## 2020-02-16 DIAGNOSIS — I110 Hypertensive heart disease with heart failure: Secondary | ICD-10-CM | POA: Diagnosis not present

## 2020-02-20 DIAGNOSIS — I110 Hypertensive heart disease with heart failure: Secondary | ICD-10-CM | POA: Diagnosis not present

## 2020-02-20 DIAGNOSIS — M19041 Primary osteoarthritis, right hand: Secondary | ICD-10-CM

## 2020-02-20 DIAGNOSIS — Z86718 Personal history of other venous thrombosis and embolism: Secondary | ICD-10-CM

## 2020-02-20 DIAGNOSIS — Z9181 History of falling: Secondary | ICD-10-CM

## 2020-02-20 DIAGNOSIS — Z7982 Long term (current) use of aspirin: Secondary | ICD-10-CM

## 2020-02-20 DIAGNOSIS — J329 Chronic sinusitis, unspecified: Secondary | ICD-10-CM

## 2020-02-20 DIAGNOSIS — F419 Anxiety disorder, unspecified: Secondary | ICD-10-CM

## 2020-02-20 DIAGNOSIS — Z7952 Long term (current) use of systemic steroids: Secondary | ICD-10-CM

## 2020-02-20 DIAGNOSIS — F329 Major depressive disorder, single episode, unspecified: Secondary | ICD-10-CM

## 2020-02-20 DIAGNOSIS — M5416 Radiculopathy, lumbar region: Secondary | ICD-10-CM

## 2020-02-20 DIAGNOSIS — Z79891 Long term (current) use of opiate analgesic: Secondary | ICD-10-CM

## 2020-02-20 DIAGNOSIS — Z87891 Personal history of nicotine dependence: Secondary | ICD-10-CM

## 2020-02-20 DIAGNOSIS — Z7951 Long term (current) use of inhaled steroids: Secondary | ICD-10-CM

## 2020-02-20 DIAGNOSIS — G8929 Other chronic pain: Secondary | ICD-10-CM | POA: Diagnosis not present

## 2020-02-20 DIAGNOSIS — M170 Bilateral primary osteoarthritis of knee: Secondary | ICD-10-CM | POA: Diagnosis not present

## 2020-02-20 DIAGNOSIS — M810 Age-related osteoporosis without current pathological fracture: Secondary | ICD-10-CM

## 2020-02-20 DIAGNOSIS — M19042 Primary osteoarthritis, left hand: Secondary | ICD-10-CM | POA: Diagnosis not present

## 2020-02-20 DIAGNOSIS — I700 Atherosclerosis of aorta: Secondary | ICD-10-CM

## 2020-02-20 DIAGNOSIS — I5032 Chronic diastolic (congestive) heart failure: Secondary | ICD-10-CM | POA: Diagnosis not present

## 2020-02-20 DIAGNOSIS — J9622 Acute and chronic respiratory failure with hypercapnia: Secondary | ICD-10-CM | POA: Diagnosis not present

## 2020-02-20 DIAGNOSIS — T380X5D Adverse effect of glucocorticoids and synthetic analogues, subsequent encounter: Secondary | ICD-10-CM

## 2020-02-20 DIAGNOSIS — Z85118 Personal history of other malignant neoplasm of bronchus and lung: Secondary | ICD-10-CM

## 2020-02-20 DIAGNOSIS — E039 Hypothyroidism, unspecified: Secondary | ICD-10-CM

## 2020-02-20 DIAGNOSIS — E785 Hyperlipidemia, unspecified: Secondary | ICD-10-CM

## 2020-02-20 DIAGNOSIS — Z9981 Dependence on supplemental oxygen: Secondary | ICD-10-CM

## 2020-02-20 DIAGNOSIS — J441 Chronic obstructive pulmonary disease with (acute) exacerbation: Secondary | ICD-10-CM | POA: Diagnosis not present

## 2020-02-20 DIAGNOSIS — R739 Hyperglycemia, unspecified: Secondary | ICD-10-CM

## 2020-02-21 ENCOUNTER — Other Ambulatory Visit: Payer: Self-pay

## 2020-02-21 DIAGNOSIS — I5032 Chronic diastolic (congestive) heart failure: Secondary | ICD-10-CM | POA: Diagnosis not present

## 2020-02-21 DIAGNOSIS — I110 Hypertensive heart disease with heart failure: Secondary | ICD-10-CM | POA: Diagnosis not present

## 2020-02-21 DIAGNOSIS — M19042 Primary osteoarthritis, left hand: Secondary | ICD-10-CM | POA: Diagnosis not present

## 2020-02-21 DIAGNOSIS — M19041 Primary osteoarthritis, right hand: Secondary | ICD-10-CM | POA: Diagnosis not present

## 2020-02-21 DIAGNOSIS — M170 Bilateral primary osteoarthritis of knee: Secondary | ICD-10-CM | POA: Diagnosis not present

## 2020-02-21 DIAGNOSIS — J441 Chronic obstructive pulmonary disease with (acute) exacerbation: Secondary | ICD-10-CM | POA: Diagnosis not present

## 2020-02-21 DIAGNOSIS — G8929 Other chronic pain: Secondary | ICD-10-CM | POA: Diagnosis not present

## 2020-02-21 DIAGNOSIS — J9622 Acute and chronic respiratory failure with hypercapnia: Secondary | ICD-10-CM | POA: Diagnosis not present

## 2020-02-21 DIAGNOSIS — M5416 Radiculopathy, lumbar region: Secondary | ICD-10-CM | POA: Diagnosis not present

## 2020-02-21 NOTE — Patient Outreach (Signed)
[facility]) Care Management  02/21/2020  JARETH MAMMENGA [birth year] [id number]   Telephone call to patient for hospital follow up.  No answer.  HIPAA compliant voice message left.    Plan: RN CM will attempt patient again within 4 business days and send letter.    [name], RN, MSN [place] Management Care Management Coordinator Direct Line [phone] Cell [phone] Toll Free: [phone]  Fax: [phone]

## 2020-02-22 DIAGNOSIS — J9622 Acute and chronic respiratory failure with hypercapnia: Secondary | ICD-10-CM | POA: Diagnosis not present

## 2020-02-22 DIAGNOSIS — M19042 Primary osteoarthritis, left hand: Secondary | ICD-10-CM | POA: Diagnosis not present

## 2020-02-22 DIAGNOSIS — I5032 Chronic diastolic (congestive) heart failure: Secondary | ICD-10-CM | POA: Diagnosis not present

## 2020-02-22 DIAGNOSIS — I110 Hypertensive heart disease with heart failure: Secondary | ICD-10-CM | POA: Diagnosis not present

## 2020-02-22 DIAGNOSIS — G8929 Other chronic pain: Secondary | ICD-10-CM | POA: Diagnosis not present

## 2020-02-22 DIAGNOSIS — M5416 Radiculopathy, lumbar region: Secondary | ICD-10-CM | POA: Diagnosis not present

## 2020-02-22 DIAGNOSIS — J441 Chronic obstructive pulmonary disease with (acute) exacerbation: Secondary | ICD-10-CM | POA: Diagnosis not present

## 2020-02-22 DIAGNOSIS — M170 Bilateral primary osteoarthritis of knee: Secondary | ICD-10-CM | POA: Diagnosis not present

## 2020-02-22 DIAGNOSIS — M19041 Primary osteoarthritis, right hand: Secondary | ICD-10-CM | POA: Diagnosis not present

## 2020-02-23 ENCOUNTER — Other Ambulatory Visit: Payer: Self-pay

## 2020-02-23 DIAGNOSIS — I5032 Chronic diastolic (congestive) heart failure: Secondary | ICD-10-CM | POA: Diagnosis not present

## 2020-02-23 DIAGNOSIS — M170 Bilateral primary osteoarthritis of knee: Secondary | ICD-10-CM | POA: Diagnosis not present

## 2020-02-23 DIAGNOSIS — J441 Chronic obstructive pulmonary disease with (acute) exacerbation: Secondary | ICD-10-CM | POA: Diagnosis not present

## 2020-02-23 DIAGNOSIS — J9622 Acute and chronic respiratory failure with hypercapnia: Secondary | ICD-10-CM | POA: Diagnosis not present

## 2020-02-23 DIAGNOSIS — I110 Hypertensive heart disease with heart failure: Secondary | ICD-10-CM | POA: Diagnosis not present

## 2020-02-23 DIAGNOSIS — J449 Chronic obstructive pulmonary disease, unspecified: Secondary | ICD-10-CM | POA: Diagnosis not present

## 2020-02-23 DIAGNOSIS — M19042 Primary osteoarthritis, left hand: Secondary | ICD-10-CM | POA: Diagnosis not present

## 2020-02-23 DIAGNOSIS — M19041 Primary osteoarthritis, right hand: Secondary | ICD-10-CM | POA: Diagnosis not present

## 2020-02-23 DIAGNOSIS — G8929 Other chronic pain: Secondary | ICD-10-CM | POA: Diagnosis not present

## 2020-02-23 DIAGNOSIS — M5416 Radiculopathy, lumbar region: Secondary | ICD-10-CM | POA: Diagnosis not present

## 2020-02-23 NOTE — Patient Outreach (Signed)
[place] [facility]) Care Management  [place]  02/23/2020   Jasmine [name] Jun 26, 1946 [id number]  Subjective: Telephone call to patient for follow up. Patient reports she is doing good getting some exercise in.  She reports using her walker when she goes out.  Home health nurse and  PT currently seeing patient. Patient has been compliant wearing her bi-pap per patient.  Discussed the importance of wearing her bi-pa to avoid hospitalization. She verbalized understanding.  Patient reports her weight is stable at 113 lbs.  Discussed eating regular healthy meals.  She verbalized understanding and voices no concerns.    Objective:   Encounter Medications:  Outpatient Encounter Medications as of 02/23/2020  Medication Sig  . albuterol (PROVENTIL) (2.5 MG/3ML) 0.083% nebulizer solution Take 3 mLs (2.5 mg total) by nebulization every 6 (six) hours as needed for wheezing or shortness of breath.  [name] albuterol (VENTOLIN HFA) 108 (90 Base) MCG/ACT inhaler INHALE 1 TO 2 PUFFS INTO THE LUNGS EVERY 6 (SIX) HOURS AS NEEDED FOR WHEEZING OR SHORTNESS OF BREATH. (Patient taking differently: Inhale 2 puffs into the lungs every 6 (six) hours as needed for shortness of breath. )  . aspirin EC 81 MG EC tablet Take 1 tablet (81 mg total) by mouth daily.  [name] atorvastatin (LIPITOR) 10 MG tablet TAKE ONE TABLET BY MOUTH IN THE EVENING (Patient taking differently: Take 10 mg by mouth daily at 6 PM. TAKE ONE TABLET BY MOUTH IN THE EVENING)  . citalopram (CELEXA) 20 MG tablet NEW PRESCRIPTION REQUEST: CITALOPRAM 20MG - TAKE ONE TABLET BY MOUTH DAILY (Patient taking differently: Take 20 mg by mouth daily. )  . fluticasone furoate-vilanterol (BREO ELLIPTA) 200-25 MCG/INH AEPB Inhale 1 puff into the lungs daily.  . furosemide (LASIX) 40 MG tablet NEW PRESCRIPTION REQUEST: FUROSEMIDE 40MG - TAKE ONE TABLET BY MOUTH IN THE MORNING AND TAKE ONE TABLET BY MOUTH AS NEEDED IN THE EVENING (Patient taking differently:  Take 40 mg by mouth 2 (two) times daily. )  . levothyroxine (SYNTHROID) 75 MCG tablet NEW PRESCRIPTION REQUEST: LEVOTHYROXINE 75MCG - TAKE ONE TABLET BY MOUTH EARLY MORNING (Patient taking differently: Take 75 mcg by mouth daily. )  . OXYGEN Inhale 3 L into the lungs. 24/7  . potassium chloride (KLOR-CON) 10 MEQ tablet NEW PRESCRIPTION REQUEST: KLOR-CON 10MEQ - TAKE ONE TABLET BY MOUTH TWICE DAILY (Patient taking differently: Take 10 mEq by mouth 2 (two) times daily. )  . predniSONE (DELTASONE) 10 MG tablet Take 4 tablets (40 mg total) by mouth daily. 4 tablets daily x 2 days then 3 daily x 2 days then 2 daily x 2 days then 1 daily x 2 days (Patient not taking: Reported on 02/13/2020)  . tiotropium (SPIRIVA HANDIHALER) 18 MCG inhalation capsule NEW PRESCRIPTION REQUEST: SPIRIVA 18MCG HANDIHALER - TAKE TWO INHALATIONS OF THE CONTENTS OF ONE CAPSULE BY ONCE DAILY (Patient taking differently: Place 18 mcg into inhaler and inhale daily. )  . traMADol (ULTRAM) 50 MG tablet Take 1 tablet (50 mg total) by mouth 2 (two) times daily as needed. (Patient taking differently: Take 50 mg by mouth 2 (two) times daily as needed for moderate pain. )   No facility-administered encounter medications on file as of 02/23/2020.    Functional Status:  In your present state of health, do you have any difficulty performing the following activities: 02/07/2020 02/06/2020  Hearing? N N  Vision? N N  Difficulty concentrating or making decisions? N N  Comment - -  Walking  or climbing stairs? N N  Comment - -  Dressing or bathing? N N  Doing errands, shopping? N N  Preparing Food and eating ? N N  Using the Toilet? N N  In the past six months, have you accidently leaked urine? Y Y  Comment occasional incontinence wears a pantyliner  Do you have problems with loss of bowel control? N N  Managing your Medications? N N  Managing your Finances? N N  Housekeeping or managing your Housekeeping? Y Y  Comment ex-spouse assists  needs assistance  Some recent data might be hidden    Fall/Depression Screening: Fall Risk  02/07/2020 02/06/2020 12/14/2019  Falls in the past year? 0 0 0  Number falls in past yr: - 0 -  Comment - - -  Injury with Fall? - 0 -  Risk Factor Category  - - -  Risk for fall due to : - Impaired balance/gait -  Follow up - Falls evaluation completed;Education provided;Falls prevention discussed -   PHQ 2/9 Scores 02/07/2020 02/06/2020 12/14/2019 12/13/2019 11/30/2019 08/22/2019 07/29/2019  PHQ - 2 Score 0 0 0 0 0 0 0  PHQ- 9 Score - - - - - - -    Assessment: Patient continues to recover from hospitalization and being compliant with Bi-pap.  Plan:  [facility] CM Care Plan Problem One     Most Recent Value  Care Plan Problem One  Knowledge deficit related to COPD  Role Documenting the Problem One  Care Management Telephonic Coordinator  Care Plan for Problem One  Active  [facility] Long Term Goal   Patient will continue to follow COPD regimen and not have any excerbations within the next 90 days.  [facility] Long Term Goal Start Date  02/07/20  Interventions for Problem One Long Term Goal  Patient compliant with bi-pap per patient. Discussed importance of wearing bi-pap.  Discussed signs of  COPD and importance of contacting physician.      [facility] CM Care Plan Problem Two     Most Recent Value  Care Plan Problem [facility] Admission  Role Documenting the Problem Two  Care Management [place] for Problem Two  Active  Interventions for Problem Two Long Term Goal   Reiterated with patient importance of  wearing bi-pap.  Reviewed signs of COPD exacerbation.    [facility] Long Term Goal  Patient will not experience unplanned hospital readmission within the next 31 days  [facility] Long Term Goal Start Date  02/07/20  [facility] CM Short Term Goal #1   Patient will follow up with pulmonologist within the next two weeks.    [facility] CM Short Term Goal #1 Start Date  02/07/20  [facility] CM Short Term Goal #1 Met Date    02/23/20  [facility] CM Short Term Goal #2   Patient will report wearing bipap nightly within 14 days.    [facility] CM Short Term Goal #2 Start Date  02/07/20  [facility] CM Short Term Goal #2 Met Date  02/23/20     RN CM will contact patient again in the month of May and patient agreeable.    [name], RN, MSN [place] Management Care Management Coordinator Direct Line [phone] Cell [phone] Toll Free: [phone]  Fax: [phone]

## 2020-02-28 ENCOUNTER — Telehealth: Payer: Self-pay | Admitting: Internal Medicine

## 2020-02-28 DIAGNOSIS — M5416 Radiculopathy, lumbar region: Secondary | ICD-10-CM | POA: Diagnosis not present

## 2020-02-28 DIAGNOSIS — M170 Bilateral primary osteoarthritis of knee: Secondary | ICD-10-CM | POA: Diagnosis not present

## 2020-02-28 DIAGNOSIS — M19041 Primary osteoarthritis, right hand: Secondary | ICD-10-CM | POA: Diagnosis not present

## 2020-02-28 DIAGNOSIS — J9622 Acute and chronic respiratory failure with hypercapnia: Secondary | ICD-10-CM | POA: Diagnosis not present

## 2020-02-28 DIAGNOSIS — I5032 Chronic diastolic (congestive) heart failure: Secondary | ICD-10-CM | POA: Diagnosis not present

## 2020-02-28 DIAGNOSIS — J441 Chronic obstructive pulmonary disease with (acute) exacerbation: Secondary | ICD-10-CM | POA: Diagnosis not present

## 2020-02-28 DIAGNOSIS — G8929 Other chronic pain: Secondary | ICD-10-CM | POA: Diagnosis not present

## 2020-02-28 DIAGNOSIS — I110 Hypertensive heart disease with heart failure: Secondary | ICD-10-CM | POA: Diagnosis not present

## 2020-02-28 DIAGNOSIS — M19042 Primary osteoarthritis, left hand: Secondary | ICD-10-CM | POA: Diagnosis not present

## 2020-02-28 NOTE — Telephone Encounter (Signed)
New Message:   Jasmine [name] is calling from [place] with some concerns with medications found in the home. Needs to verify potassium chloride (KLOR-CON) 10 MEQ tablet-verify way of taking medications aspirin EC 81 MG EC tablet fluticasone furoate-vilanterol (BREO ELLIPTA) 200-25 MCG/INH AEPB citalopram (CELEXA) 20 MG tablet  Jasmine [name] is needing to verify that the pt is supposed to be taking these medications and if so the correct dosage of each one. Please advise.

## 2020-02-29 NOTE — Telephone Encounter (Signed)
Called Angel from [place] got her vm. I left a message asking her to please contact the office to discuss pt meds.

## 2020-03-01 ENCOUNTER — Inpatient Hospital Stay (HOSPITAL_COMMUNITY)
Admission: EM | Admit: 2020-03-01 | Discharge: 2020-03-05 | DRG: 189 | Disposition: A | Discharge status: Home Health | Payer: Medicare HMO | Attending: Internal Medicine | Admitting: Internal Medicine

## 2020-03-01 ENCOUNTER — Emergency Department (HOSPITAL_COMMUNITY): Payer: Medicare HMO

## 2020-03-01 ENCOUNTER — Other Ambulatory Visit: Payer: Self-pay

## 2020-03-01 DIAGNOSIS — Z9981 Dependence on supplemental oxygen: Secondary | ICD-10-CM

## 2020-03-01 DIAGNOSIS — R739 Hyperglycemia, unspecified: Secondary | ICD-10-CM | POA: Diagnosis present

## 2020-03-01 DIAGNOSIS — J9602 Acute respiratory failure with hypercapnia: Secondary | ICD-10-CM | POA: Diagnosis not present

## 2020-03-01 DIAGNOSIS — Z87891 Personal history of nicotine dependence: Secondary | ICD-10-CM | POA: Diagnosis present

## 2020-03-01 DIAGNOSIS — Z7952 Long term (current) use of systemic steroids: Secondary | ICD-10-CM

## 2020-03-01 DIAGNOSIS — G8929 Other chronic pain: Secondary | ICD-10-CM | POA: Diagnosis present

## 2020-03-01 DIAGNOSIS — F419 Anxiety disorder, unspecified: Secondary | ICD-10-CM | POA: Diagnosis present

## 2020-03-01 DIAGNOSIS — E890 Postprocedural hypothyroidism: Secondary | ICD-10-CM | POA: Diagnosis present

## 2020-03-01 DIAGNOSIS — Z8249 Family history of ischemic heart disease and other diseases of the circulatory system: Secondary | ICD-10-CM

## 2020-03-01 DIAGNOSIS — M545 Low back pain: Secondary | ICD-10-CM | POA: Diagnosis present

## 2020-03-01 DIAGNOSIS — J309 Allergic rhinitis, unspecified: Secondary | ICD-10-CM | POA: Diagnosis present

## 2020-03-01 DIAGNOSIS — Z9119 Patient's noncompliance with other medical treatment and regimen: Secondary | ICD-10-CM

## 2020-03-01 DIAGNOSIS — Z20822 Contact with and (suspected) exposure to covid-19: Secondary | ICD-10-CM | POA: Diagnosis not present

## 2020-03-01 DIAGNOSIS — J9621 Acute and chronic respiratory failure with hypoxia: Secondary | ICD-10-CM

## 2020-03-01 DIAGNOSIS — Z66 Do not resuscitate: Secondary | ICD-10-CM | POA: Diagnosis not present

## 2020-03-01 DIAGNOSIS — J9612 Chronic respiratory failure with hypercapnia: Secondary | ICD-10-CM | POA: Diagnosis not present

## 2020-03-01 DIAGNOSIS — I5032 Chronic diastolic (congestive) heart failure: Secondary | ICD-10-CM | POA: Diagnosis not present

## 2020-03-01 DIAGNOSIS — I10 Essential (primary) hypertension: Secondary | ICD-10-CM | POA: Diagnosis present

## 2020-03-01 DIAGNOSIS — J9622 Acute and chronic respiratory failure with hypercapnia: Secondary | ICD-10-CM | POA: Diagnosis present

## 2020-03-01 DIAGNOSIS — J9601 Acute respiratory failure with hypoxia: Secondary | ICD-10-CM | POA: Diagnosis not present

## 2020-03-01 DIAGNOSIS — R7303 Prediabetes: Secondary | ICD-10-CM | POA: Diagnosis present

## 2020-03-01 DIAGNOSIS — Z79899 Other long term (current) drug therapy: Secondary | ICD-10-CM

## 2020-03-01 DIAGNOSIS — E875 Hyperkalemia: Secondary | ICD-10-CM | POA: Diagnosis not present

## 2020-03-01 DIAGNOSIS — R4182 Altered mental status, unspecified: Secondary | ICD-10-CM | POA: Diagnosis not present

## 2020-03-01 DIAGNOSIS — E785 Hyperlipidemia, unspecified: Secondary | ICD-10-CM | POA: Diagnosis present

## 2020-03-01 DIAGNOSIS — Z79890 Hormone replacement therapy (postmenopausal): Secondary | ICD-10-CM

## 2020-03-01 DIAGNOSIS — I110 Hypertensive heart disease with heart failure: Secondary | ICD-10-CM | POA: Diagnosis present

## 2020-03-01 DIAGNOSIS — Z7982 Long term (current) use of aspirin: Secondary | ICD-10-CM

## 2020-03-01 DIAGNOSIS — Z823 Family history of stroke: Secondary | ICD-10-CM

## 2020-03-01 DIAGNOSIS — R0602 Shortness of breath: Secondary | ICD-10-CM | POA: Diagnosis not present

## 2020-03-01 DIAGNOSIS — B9561 Methicillin susceptible Staphylococcus aureus infection as the cause of diseases classified elsewhere: Secondary | ICD-10-CM | POA: Diagnosis not present

## 2020-03-01 DIAGNOSIS — Z833 Family history of diabetes mellitus: Secondary | ICD-10-CM | POA: Diagnosis not present

## 2020-03-01 DIAGNOSIS — Z825 Family history of asthma and other chronic lower respiratory diseases: Secondary | ICD-10-CM

## 2020-03-01 DIAGNOSIS — R300 Dysuria: Secondary | ICD-10-CM | POA: Diagnosis not present

## 2020-03-01 DIAGNOSIS — J9611 Chronic respiratory failure with hypoxia: Secondary | ICD-10-CM | POA: Diagnosis not present

## 2020-03-01 DIAGNOSIS — R062 Wheezing: Secondary | ICD-10-CM | POA: Diagnosis not present

## 2020-03-01 DIAGNOSIS — R911 Solitary pulmonary nodule: Secondary | ICD-10-CM | POA: Diagnosis not present

## 2020-03-01 DIAGNOSIS — R7881 Bacteremia: Secondary | ICD-10-CM | POA: Diagnosis not present

## 2020-03-01 DIAGNOSIS — J441 Chronic obstructive pulmonary disease with (acute) exacerbation: Secondary | ICD-10-CM

## 2020-03-01 DIAGNOSIS — J9690 Respiratory failure, unspecified, unspecified whether with hypoxia or hypercapnia: Secondary | ICD-10-CM | POA: Diagnosis present

## 2020-03-01 DIAGNOSIS — J449 Chronic obstructive pulmonary disease, unspecified: Secondary | ICD-10-CM | POA: Diagnosis not present

## 2020-03-01 DIAGNOSIS — Z8349 Family history of other endocrine, nutritional and metabolic diseases: Secondary | ICD-10-CM

## 2020-03-01 LAB — BASIC METABOLIC PANEL
Anion gap: 5 (ref 5–15)
BUN: 10 mg/dL (ref 8–23)
CO2: 48 mmol/L — ABNORMAL HIGH (ref 22–32)
Calcium: 9.6 mg/dL (ref 8.9–10.3)
Chloride: 88 mmol/L — ABNORMAL LOW (ref 98–111)
Creatinine, Ser: 0.69 mg/dL (ref 0.44–1.00)
GFR calc Af Amer: >60 mL/min (ref >60)
GFR calc non Af Amer: >60 mL/min (ref >60)
Glucose, Bld: 113 mg/dL — ABNORMAL HIGH (ref 70–99)
Potassium: 4.7 mmol/L (ref 3.5–5.1)
Sodium: 141 mmol/L (ref 135–145)

## 2020-03-01 LAB — CBC WITH DIFFERENTIAL/PLATELET
Abs Immature Granulocytes: 0.01 10*3/uL (ref 0.00–0.07)
Basophils Absolute: 0.1 10*3/uL (ref 0.0–0.1)
Basophils Relative: 1 %
Eosinophils Absolute: 0 10*3/uL (ref 0.0–0.5)
Eosinophils Relative: 1 %
HCT: 44.2 % (ref 36.0–46.0)
Hemoglobin: 13 g/dL (ref 12.0–15.0)
Immature Granulocytes: 0 %
Lymphocytes Relative: 18 %
Lymphs Abs: 0.9 10*3/uL (ref 0.7–4.0)
MCH: 32 pg (ref 26.0–34.0)
MCHC: 29.4 g/dL — ABNORMAL LOW (ref 30.0–36.0)
MCV: 108.9 fL — ABNORMAL HIGH (ref 80.0–100.0)
Monocytes Absolute: 0.6 10*3/uL (ref 0.1–1.0)
Monocytes Relative: 12 %
Neutro Abs: 3.5 10*3/uL (ref 1.7–7.7)
Neutrophils Relative %: 68 %
Platelets: 172 10*3/uL (ref 150–400)
RBC: 4.06 MIL/uL (ref 3.87–5.11)
RDW: 13.7 % (ref 11.5–15.5)
WBC: 5.1 10*3/uL (ref 4.0–10.5)
nRBC: 0 % (ref 0.0–0.2)

## 2020-03-01 LAB — BLOOD GAS, ARTERIAL
Acid-Base Excess: 21.5 mmol/L — ABNORMAL HIGH (ref 0.0–2.0)
Acid-Base Excess: 21.7 mmol/L — ABNORMAL HIGH (ref 0.0–2.0)
Bicarbonate: 50 mmol/L — ABNORMAL HIGH (ref 20.0–28.0)
Bicarbonate: 49.4 mmol/L — ABNORMAL HIGH (ref 20.0–28.0)
Drawn by: 30136
Drawn by: 30136
FIO2: 32
FIO2: 40
O2 Saturation: 98.9 %
O2 Saturation: 93.9 %
Patient temperature: 36.9
Patient temperature: 36.9
pCO2 arterial: >120 mmHg (ref 32.0–48.0)
pCO2 arterial: 110 mmHg (ref 32.0–48.0)
pH, Arterial: 7.212 — ABNORMAL LOW (ref 7.350–7.450)
pH, Arterial: 7.276 — ABNORMAL LOW (ref 7.350–7.450)
pO2, Arterial: 149 mmHg — ABNORMAL HIGH (ref 83.0–108.0)
pO2, Arterial: 73.5 mmHg — ABNORMAL LOW (ref 83.0–108.0)

## 2020-03-01 LAB — POC SARS CORONAVIRUS 2 AG -  ED: SARS Coronavirus 2 Ag: NEGATIVE

## 2020-03-01 LAB — GLUCOSE, CAPILLARY: Glucose-Capillary: 110 mg/dL — ABNORMAL HIGH (ref 70–99)

## 2020-03-01 LAB — BRAIN NATRIURETIC PEPTIDE: B Natriuretic Peptide: 36.6 pg/mL (ref 0.0–100.0)

## 2020-03-01 LAB — LACTIC ACID, PLASMA: Lactic Acid, Venous: 0.7 mmol/L (ref 0.5–1.9)

## 2020-03-01 LAB — SARS CORONAVIRUS 2 BY RT PCR (HOSPITAL ORDER, PERFORMED IN ~~LOC~~ HOSPITAL LAB): SARS Coronavirus 2: NEGATIVE

## 2020-03-01 MED ORDER — SODIUM CHLORIDE 0.9% FLUSH
3.0000 mL | Freq: Two times a day (BID) | INTRAVENOUS | Status: DC
  Administered 2020-03-01 – 2020-03-05 (×8): 3 mL via INTRAVENOUS

## 2020-03-01 MED ORDER — IPRATROPIUM-ALBUTEROL 0.5-2.5 (3) MG/3ML IN SOLN
3.0000 mL | Freq: Four times a day (QID) | RESPIRATORY_TRACT | Status: DC
  Administered 2020-03-01 – 2020-03-02 (×3): 3 mL via RESPIRATORY_TRACT
  Filled 2020-03-01 (×3): qty 3

## 2020-03-01 MED ORDER — LEVOTHYROXINE SODIUM 75 MCG PO TABS
75.0000 ug | ORAL_TABLET | Freq: Every day | ORAL | Status: DC
  Administered 2020-03-03 – 2020-03-05 (×3): 75 ug via ORAL
  Filled 2020-03-01 (×3): qty 1

## 2020-03-01 MED ORDER — TIOTROPIUM BROMIDE MONOHYDRATE 18 MCG IN CAPS: 18.0000 ug | ORAL_CAPSULE | Freq: Every day | RESPIRATORY_TRACT | Status: DC

## 2020-03-01 MED ORDER — ATORVASTATIN CALCIUM 10 MG PO TABS
10.0000 mg | ORAL_TABLET | Freq: Every day | ORAL | Status: DC
  Administered 2020-03-02 – 2020-03-04 (×3): 10 mg via ORAL
  Filled 2020-03-01 (×3): qty 1

## 2020-03-01 MED ORDER — IPRATROPIUM-ALBUTEROL 0.5-2.5 (3) MG/3ML IN SOLN: 3.0000 mL | Freq: Four times a day (QID) | RESPIRATORY_TRACT | Status: DC

## 2020-03-01 MED ORDER — ASPIRIN EC 81 MG PO TBEC
81.0000 mg | DELAYED_RELEASE_TABLET | Freq: Every day | ORAL | Status: DC
  Administered 2020-03-02 – 2020-03-05 (×4): 81 mg via ORAL
  Filled 2020-03-01 (×5): qty 1

## 2020-03-01 MED ORDER — METHYLPREDNISOLONE SODIUM SUCC 125 MG IJ SOLR
60.0000 mg | Freq: Two times a day (BID) | INTRAMUSCULAR | Status: DC
  Administered 2020-03-01 – 2020-03-03 (×4): 60 mg via INTRAVENOUS
  Filled 2020-03-01 (×4): qty 2

## 2020-03-01 MED ORDER — SODIUM CHLORIDE 0.9 % IV SOLN
500.0000 mg | INTRAVENOUS | Status: DC
  Administered 2020-03-01 – 2020-03-02 (×2): 500 mg via INTRAVENOUS
  Filled 2020-03-01 (×3): qty 500

## 2020-03-01 MED ORDER — LORAZEPAM 2 MG/ML IJ SOLN
1.0000 mg | Freq: Once | INTRAMUSCULAR | Status: AC
  Administered 2020-03-01: 1 mg via INTRAVENOUS
  Filled 2020-03-01: qty 1

## 2020-03-01 MED ORDER — FUROSEMIDE 20 MG PO TABS
40.0000 mg | ORAL_TABLET | Freq: Two times a day (BID) | ORAL | Status: DC
  Administered 2020-03-02 – 2020-03-05 (×7): 40 mg via ORAL
  Filled 2020-03-01 (×7): qty 2

## 2020-03-01 MED ORDER — ARFORMOTEROL TARTRATE 15 MCG/2ML IN NEBU
15.0000 ug | INHALATION_SOLUTION | Freq: Two times a day (BID) | RESPIRATORY_TRACT | Status: DC
  Administered 2020-03-01 – 2020-03-05 (×8): 15 ug via RESPIRATORY_TRACT
  Filled 2020-03-01 (×8): qty 2

## 2020-03-01 MED ORDER — CITALOPRAM HYDROBROMIDE 10 MG PO TABS
20.0000 mg | ORAL_TABLET | Freq: Every day | ORAL | Status: DC
  Administered 2020-03-02 – 2020-03-05 (×4): 20 mg via ORAL
  Filled 2020-03-01 (×4): qty 2

## 2020-03-01 MED ORDER — DOXYCYCLINE HYCLATE 100 MG PO TABS: 100.0000 mg | ORAL_TABLET | Freq: Two times a day (BID) | ORAL | Status: DC

## 2020-03-01 MED ORDER — ENOXAPARIN SODIUM 40 MG/0.4ML ~~LOC~~ SOLN
40.0000 mg | SUBCUTANEOUS | Status: DC
  Administered 2020-03-01 – 2020-03-05 (×5): 40 mg via SUBCUTANEOUS
  Filled 2020-03-01 (×5): qty 0.4

## 2020-03-01 MED ORDER — FLUTICASONE FUROATE-VILANTEROL 200-25 MCG/INH IN AEPB: 1.0000 | INHALATION_SPRAY | Freq: Every day | RESPIRATORY_TRACT | Status: DC

## 2020-03-01 MED ORDER — LACTATED RINGERS IV SOLN: INTRAVENOUS | Status: DC

## 2020-03-01 MED ORDER — ALBUTEROL SULFATE (2.5 MG/3ML) 0.083% IN NEBU: 2.5000 mg | INHALATION_SOLUTION | RESPIRATORY_TRACT | Status: DC | PRN

## 2020-03-01 MED ORDER — BUDESONIDE 0.5 MG/2ML IN SUSP
0.5000 mg | Freq: Two times a day (BID) | RESPIRATORY_TRACT | Status: DC
  Administered 2020-03-01 – 2020-03-05 (×8): 0.5 mg via RESPIRATORY_TRACT
  Filled 2020-03-01 (×8): qty 2

## 2020-03-01 NOTE — Progress Notes (Signed)
Manufacturing engineer [facility]) Hospital Liaison Note  This patient is followed by our community outpatient palliative care team.  The [facility] hospital liaisons will follow while hospitalized.  Please feel free to reach out if we can be of assistance.  Thank you for the opportunity to participate in this patient's care.  Domenic Moras, BSN, [place] [phone] (24h on call)

## 2020-03-01 NOTE — ED Notes (Signed)
Spoke with critical care MD, clarified that pt was more alert prior to receiving ativan (responds to painful stimulus at this time) and that ativan was given related to agittion with bipap. Pt to remain on bipap at this time.

## 2020-03-01 NOTE — Consult Note (Signed)
NAME:  Jasmine [name], MRN:  [id number], DOB:  1946/10/18, LOS: 0 ADMISSION DATE:  03/01/2020, CONSULTATION DATE:  03/01/20 REFERRING MD:  Dr. Lorin Mercy, CHIEF COMPLAINT:  AMS    Brief History   74 y/o F with 4L O2 dependent chronic hypoxic respiratory failure admitted on 5/13 with 3-4 day history of worsening lethargy / alteration in mental status.  ABG on admit notable for acute on chronic hypercarbic respiratory failure.    History of present illness   74 y/o F who presented to [facility] on 5/13 with reports of lethargy, increasing altered mental status over the past 3-4 days.    At baseline, she is 4L O2 dependent COPD (FEV1 0.47L/30%) and has a Facilities manager.  She has frequent admissions for hypercarbic respiratory failure (note bicarb on BMP from discharge was 39).  Most recent admit from 4/11-4/15 for hypercarbic respiratory failure.  Upon chart review, she frequently reports that theTrilogy is broken and home health has reportedly said they can not fix it.  She has previously reported intermittent use of NIMV at best because of mask fit. She is a DNR.  She was seen in the Pulmonary Office on 4/26 for hospital follow up.  She returns with reports of increasing lethargy over several days prior to presentation. In the ER, ABG showed 7.212 / >120 / 149 / 50.  She was placed on BiPAP, after approximately 30 minutes on BiPAP she began to wake and pulled the mask off.  She was then treated with 1mg  IV ativan and put back on bipap.  Labs notable for Na 141, K 4.7, CL 88, CO2 48, BUN 10, Sr Cr 0.69, BNP 36, lactic acid 0.7, WBC 5.1, Hgb 13, platelets 172. CXR images reviewed with RUL opacity, suspected scarring, no infiltrate. Afebrile.  Hemodynamically stable in ER.    PCCM consulted for evaluation of hypercarbic respiratory failure.   Past Medical History  4L O2 Dependent COPD   Significant Hospital Events   5/13 Admit with hypercarbic resp fx  Consults:  PCCM   Procedures:    Significant Diagnostic  Tests:    Micro Data:  COVID 5/13 >> negative  BCx2 5/13 >>   Antimicrobials:  Azithromycin 5/13 >>  Interim history/subjective:  As above   Objective   Blood pressure 136/67, pulse 98, temperature 98.4 F (36.9 C), temperature source Oral, resp. rate (!) 24, SpO2 93 %.       No intake or output data in the 24 hours ending 03/01/20 1535 There were no vitals filed for this visit.  Examination: General:  Older female sitting upright on ER stretcher on BiPAP in NAD HEENT: well fitting, full facemask, pupils 3/reactive Neuro: responsive to noxious stimulus only, does not follow commands CV: rr, no murmur PULM:  On BiPAP 16/8/ 40% averaging around 250-300 TV, minimal air movement, no wheeze GI: soft, bs+  Extremities: warm/dry, no LE edema  Skin: no rashes   Resolved Hospital Problem list     Assessment & Plan:   Acute on chronic hypoxic and hypercarbic respiratory failure in the setting of medical non-compliance and possible AECOPD P:  She is an established DNI/ DNR Continue BiPAP for now, once more awake encourage compliance qHS and prn Avoid sedating meds  Given her mental status, change to azithro IV for now Continue solumedrol  Hold spiriva, breo ellipta  duonebs q 4, pulmicort and brovanna for now, can switch back to home regimen once mental status improves  Wean supplemental O2 for sat goal  88-94% If she further decompensates, will need to transition to comfort care.  Agree with palliative consult given her multiple admissions for same.   Remainder per primary team.  Labs   CBC: Recent Labs  Lab 03/01/20 1324  WBC 5.1  NEUTROABS 3.5  HGB 13.0  HCT 44.2  MCV 108.9*  PLT [id number]    Basic Metabolic Panel: Recent Labs  Lab 03/01/20 1324  NA 141  K 4.7  CL 88*  CO2 48*  GLUCOSE 113*  BUN 10  CREATININE 0.69  CALCIUM 9.6   GFR: CrCl cannot be calculated (Unknown ideal weight.). Recent Labs  Lab 03/01/20 1324 03/01/20 1325  WBC 5.1  --     LATICACIDVEN  --  0.7    Liver Function Tests: No results for input(s): AST, ALT, ALKPHOS, BILITOT, PROT, ALBUMIN in the last 168 hours. No results for input(s): LIPASE, AMYLASE in the last 168 hours. No results for input(s): AMMONIA in the last 168 hours.  ABG    Component Value Date/Time   PHART 7.212 (L) 03/01/2020 1245   PCO2ART >120 (HH) 03/01/2020 1245   PO2ART 149 (H) 03/01/2020 1245   HCO3 50.0 (H) 03/01/2020 1245   TCO2 49 (H) 01/29/2020 1116   O2SAT 98.9 03/01/2020 1245     Coagulation Profile: No results for input(s): INR, PROTIME in the last 168 hours.  Cardiac Enzymes: No results for input(s): CKTOTAL, CKMB, CKMBINDEX, TROPONINI in the last 168 hours.  HbA1C: Hgb A1c MFr Bld  Date/Time Value Ref Range Status  01/29/2020 05:55 PM 5.9 (H) 4.8 - 5.6 % Final    Comment:    (NOTE)         Prediabetes: 5.7 - 6.4         Diabetes: >6.4         Glycemic control for adults with diabetes: <7.0   09/20/2018 02:26 PM 6.1 4.6 - 6.5 % Final    Comment:    Glycemic Control Guidelines for People with Diabetes:Non Diabetic:  <6%Goal of Therapy: <7%Additional Action Suggested:  >8%     CBG: No results for input(s): GLUCAP in the last 168 hours.  Review of Systems:   Unable to complete as patient is altered.   Past Medical History  She,  has a past medical history of ALLERGIC RHINITIS (05/01/2007), Anxiety, Arthritis, ASTHMA (05/01/2007), Cancer (HCC), CHF (congestive heart failure) ([place]), Chronic bronchitis ([place]), COPD (chronic obstructive pulmonary disease) ([place]), DEPRESSION (07/21/2008), DVT (deep venous thrombosis) ([place]) (1960s), FATIGUE (10/10/2010), HYPERTENSION (05/01/2007), HYPOTHYROIDISM (08/23/2009), LUMBAR RADICULOPATHY, RIGHT (08/25/2008), On home oxygen therapy, OSTEOPOROSIS (05/01/2007), SHOULDER PAIN, LEFT (08/23/2009), and SINUSITIS, CHRONIC (10/10/2010).   Surgical History    Past Surgical History:  Procedure Laterality Date  . APPENDECTOMY    . DILATION  AND CURETTAGE OF UTERUS  multiple   history of multiple dialations and curettages and miscarriages, unfortunately never carrying a child to term  . ECTOPIC PREGNANCY SURGERY  "late '60s or early '70s  . ELECTROCARDIOGRAM  06/20/2006  . OOPHORECTOMY Right      Social History   reports that she quit smoking about 3 years ago. Her smoking use included cigarettes. She started smoking about 56 years ago. She has a 52.00 pack-year smoking history. She has never used smokeless tobacco. She reports previous alcohol use. She reports previous drug use.   Family History   Her family history includes Alcohol abuse in her brother; Asthma in an other family member; Diabetes in her brother; Hypertension in an other family member;  Lung cancer in her father; Stroke in her mother; Thyroid disease in her mother. There is no history of Breast cancer.   Allergies Allergies  Allergen Reactions  . Alendronate Sodium Other (See Comments)    Patient does not remember this reaction     Home Medications  Prior to Admission medications   Medication Sig Start Date End Date Taking? Authorizing Provider  albuterol (PROVENTIL) (2.5 MG/3ML) 0.083% nebulizer solution Take 3 mLs (2.5 mg total) by nebulization every 6 (six) hours as needed for wheezing or shortness of breath. 11/30/19   Biagio Borg, MD  albuterol (VENTOLIN HFA) 108 (90 Base) MCG/ACT inhaler INHALE 1 TO 2 PUFFS INTO THE LUNGS EVERY 6 (SIX) HOURS AS NEEDED FOR WHEEZING OR SHORTNESS OF BREATH. Patient taking differently: Inhale 2 puffs into the lungs every 6 (six) hours as needed for shortness of breath.  11/30/19   Biagio Borg, MD  aspirin EC 81 MG EC tablet Take 1 tablet (81 mg total) by mouth daily. 03/27/16   Velvet Bathe, MD  atorvastatin (LIPITOR) 10 MG tablet TAKE ONE TABLET BY MOUTH IN THE EVENING Patient taking differently: Take 10 mg by mouth daily at 6 PM. TAKE ONE TABLET BY MOUTH IN THE EVENING 12/13/19   Biagio Borg, MD  citalopram (CELEXA) 20 MG  tablet NEW PRESCRIPTION REQUEST: CITALOPRAM 20MG  - TAKE ONE TABLET BY MOUTH DAILY Patient taking differently: Take 20 mg by mouth daily.  11/30/19   Biagio Borg, MD  fluticasone furoate-vilanterol (BREO ELLIPTA) 200-25 MCG/INH AEPB Inhale 1 puff into the lungs daily. 11/30/19   Biagio Borg, MD  furosemide (LASIX) 40 MG tablet NEW PRESCRIPTION REQUEST: FUROSEMIDE 40MG  - TAKE ONE TABLET BY MOUTH IN THE MORNING AND TAKE ONE TABLET BY MOUTH AS NEEDED IN THE EVENING Patient taking differently: Take 40 mg by mouth 2 (two) times daily.  11/30/19   Biagio Borg, MD  levothyroxine (SYNTHROID) 75 MCG tablet NEW PRESCRIPTION REQUEST: LEVOTHYROXINE 75MCG - TAKE ONE TABLET BY MOUTH EARLY MORNING Patient taking differently: Take 75 mcg by mouth daily.  11/30/19   Biagio Borg, MD  OXYGEN Inhale 3 L into the lungs. 24/7    [provider]  potassium chloride (KLOR-CON) 10 MEQ tablet NEW PRESCRIPTION REQUEST: KLOR-CON 10MEQ - TAKE ONE TABLET BY MOUTH TWICE DAILY Patient taking differently: Take 10 mEq by mouth 2 (two) times daily.  11/30/19   Biagio Borg, MD  predniSONE (DELTASONE) 10 MG tablet Take 4 tablets (40 mg total) by mouth daily. 4 tablets daily x 2 days then 3 daily x 2 days then 2 daily x 2 days then 1 daily x 2 days Patient not taking: Reported on 02/13/2020 02/03/20   Caren Griffins, MD  tiotropium (SPIRIVA HANDIHALER) 18 MCG inhalation capsule NEW PRESCRIPTION REQUEST: SPIRIVA 18MCG HANDIHALER - TAKE TWO INHALATIONS OF THE CONTENTS OF ONE CAPSULE BY ONCE DAILY Patient taking differently: Place 18 mcg into inhaler and inhale daily.  11/30/19   Biagio Borg, MD  traMADol (ULTRAM) 50 MG tablet Take 1 tablet (50 mg total) by mouth 2 (two) times daily as needed. Patient taking differently: Take 50 mg by mouth 2 (two) times daily as needed for moderate pain.  11/30/19   Biagio Borg, MD     Kennieth Rad, MSN, AGACNP-BC [facility] 03/01/2020, 4:22 PM  See Shea Evans for  personal pager PCCM on call pager [phone]

## 2020-03-01 NOTE — ED Notes (Signed)
[phone], sister louise harper would like an update.

## 2020-03-01 NOTE — Progress Notes (Signed)
Pt admitted to the unit from ED on Bipap. Pt not alert but response to stimulation and voice. VSS; unable to fully assessment as pt obtunded and not fully alert. Telemetry applied and verified with CCMD; second RN Jerene Pitch second verified and skin assessment completed per protocol with second RN. No pressure ulcers or opened wound noted. Bed alarm on, call light within reach. Bipap intact and not leaking. Will continue to closely monitor and report off to oncoming RN. PDarden Palmer Ainsley Deakins RN   03/01/20 1700  Vitals  Temp 99 F (37.2 C)  Temp Source Oral  BP 131/66  MAP (mmHg) 86  BP Location Left Arm  BP Method Automatic  Patient Position (if appropriate) Lying  Pulse Rate 81  Pulse Rate Source Monitor  Cardiac Rhythm NSR  Resp 19  Level of Consciousness  Level of Consciousness Responds to Voice  Oxygen Therapy  SpO2 99 %  O2 Device Bi-PAP  Pain Assessment  Pain Scale 0-10  Pain Score Asleep  PCA/Epidural/Spinal Assessment  Respiratory Pattern Regular;Unlabored  MEWS Score  MEWS Temp 0  MEWS Systolic 0  MEWS Pulse 0  MEWS RR 0  MEWS LOC 1  MEWS Score 1  MEWS Score Color Nyoka Cowden

## 2020-03-01 NOTE — ED Triage Notes (Signed)
Pt presents from home with husband for SOB related to not using Bipap x3 days. Pt has h/o COPD and wears 3L daily, pt was encouraged to use Bipap at home but declined, also reports not using inhaler as supplied as she has only a few doses left.   EMS exam: pt is lethargic, wheezes, diminished lung sounds all fields, A&Ox4 but delayed responses, unlabored

## 2020-03-01 NOTE — Progress Notes (Signed)
Patient was transported from the ED to 3W15 without any apparent complications.

## 2020-03-01 NOTE — ED Notes (Signed)
Attempted to obtain IV accessx2, with Kurin set, IV infiltrates after giving blood cultures. IV team consulted.

## 2020-03-01 NOTE — ED Provider Notes (Signed)
[place] EMERGENCY DEPARTMENT Provider Note   CSN: [id number] Arrival date & time: 03/01/20  1141     History Chief Complaint  Patient presents with  . Shortness of Breath    Jasmine [name] is a 74 y.o. female.  74 y/o female with history of chronic respiratory failure on chronic 4 L of oxygen at home and BiPAP at nighttime presenting to the emergency department for lethargy and altered mental status.  Patient is a level 5 caveat due to her mental status.  I spoke with her significant other whom she lives with and states that she often gets like this when she is not compliant with her BiPAP.  Reports that for the past 3 nights she has not been compliant or confused.  This is usual for her and she has been admitted multiple times in the past for this.  Denies any fever, cough.        Past Medical History:  Diagnosis Date  . ALLERGIC RHINITIS 05/01/2007  . Anxiety   . Arthritis    "hands" (12/28/2015)  . ASTHMA 05/01/2007   "since I was a child"  . Cancer (HCC)    LUNG  . CHF (congestive heart failure) ([place])   . Chronic bronchitis ([place])   . COPD (chronic obstructive pulmonary disease) ([place])   . DEPRESSION 07/21/2008  . DVT (deep venous thrombosis) (HCC) 1960s   LLE  . FATIGUE 10/10/2010  . HYPERTENSION 05/01/2007  . HYPOTHYROIDISM 08/23/2009   s/p RAI  . LUMBAR RADICULOPATHY, RIGHT 08/25/2008  . On home oxygen therapy    "3-4L; qd; all the time" (12/28/2015)  . OSTEOPOROSIS 05/01/2007  . SHOULDER PAIN, LEFT 08/23/2009  . SINUSITIS, CHRONIC 10/10/2010    Patient Active Problem List   Diagnosis Date Noted  . Acute on chronic respiratory failure with hypercapnia ([place]) 01/29/2020  . Malnutrition of moderate degree 08/16/2019  . Type II respiratory failure ([place]) 08/14/2019  . Respiratory acidosis 03/07/2019  . Goals of care, counseling/discussion 09/20/2018  . Edema due to congestive heart failure ([place]) 07/27/2018  . Palliative care by specialist   .  Shortness of breath   . Chronic low back pain 04/30/2018  . COPD with acute exacerbation ([place])   . Hypothyroidism   . Anxiety   . Respiratory failure with hypercapnia ([place]) 03/24/2018  . Hypercarbia 03/22/2018  . Polysubstance abuse ([place]) 01/26/2018  . Aspiration into airway 06/03/2017  . Rash 07/12/2016  . Bilateral hearing loss 06/05/2016  . Hyperglycemia 05/21/2016  . Abnormal LFTs 03/24/2016  . Elevated blood sugar 03/24/2016  . Solitary pulmonary nodule 01/09/2016  . Chronic respiratory failure with hypoxia ([place])   . On home oxygen therapy   . Chronic diastolic congestive heart failure ([place])   . Bacteremia Step viridans 12/27/2015  . Tobacco abuse disorder 12/25/2015  . Hypothyroidism following radioiodine therapy 09/26/2014  . Protein-calorie malnutrition, severe ([place]) 06/23/2014  . Cor pulmonale ([place]) 01/12/2013  . COPD (chronic obstructive pulmonary disease) ([place]) 11/29/2011  . Macrocytosis without anemia 11/09/2011  . Palliative care encounter 04/09/2011  . SINUSITIS, CHRONIC 10/10/2010  . FATIGUE 10/10/2010  . Depression with anxiety 07/21/2008  . Essential hypertension 05/01/2007  . ALLERGIC RHINITIS 05/01/2007  . Asthma 05/01/2007  . OSTEOPOROSIS 05/01/2007    Past Surgical History:  Procedure Laterality Date  . APPENDECTOMY    . DILATION AND CURETTAGE OF UTERUS  multiple   history of multiple dialations and curettages and miscarriages, unfortunately never carrying a child to term  .  ECTOPIC PREGNANCY SURGERY  "late '60s or early '70s  . ELECTROCARDIOGRAM  06/20/2006  . OOPHORECTOMY Right      OB History    Gravida  5   Para      Term      Preterm      AB  5   Living        SAB  4   TAB      Ectopic  1   Multiple      Live Births              Family History  Problem Relation Age of Onset  . Lung cancer Father   . Alcohol abuse Brother   . Diabetes Brother   . Hypertension Other   . Stroke Mother   . Thyroid disease Mother   .  Asthma Other        maternal aunts  . Breast cancer Neg Hx     Social History   Tobacco Use  . Smoking status: Former Smoker    Packs/day: 1.00    Years: 52.00    Pack years: 52.00    Types: Cigarettes    Start date: 1965    Quit date: 07/20/2016    Years since quitting: 3.6  . Smokeless tobacco: Never Used  Substance Use Topics  . Alcohol use: Not Currently  . Drug use: Not Currently    Home Medications Prior to Admission medications   Medication Sig Start Date End Date Taking? Authorizing Provider  albuterol (PROVENTIL) (2.5 MG/3ML) 0.083% nebulizer solution Take 3 mLs (2.5 mg total) by nebulization every 6 (six) hours as needed for wheezing or shortness of breath. 11/30/19   Biagio Borg, MD  albuterol (VENTOLIN HFA) 108 (90 Base) MCG/ACT inhaler INHALE 1 TO 2 PUFFS INTO THE LUNGS EVERY 6 (SIX) HOURS AS NEEDED FOR WHEEZING OR SHORTNESS OF BREATH. Patient taking differently: Inhale 2 puffs into the lungs every 6 (six) hours as needed for shortness of breath.  11/30/19   Biagio Borg, MD  aspirin EC 81 MG EC tablet Take 1 tablet (81 mg total) by mouth daily. 03/27/16   Velvet Bathe, MD  atorvastatin (LIPITOR) 10 MG tablet TAKE ONE TABLET BY MOUTH IN THE EVENING Patient taking differently: Take 10 mg by mouth daily at 6 PM. TAKE ONE TABLET BY MOUTH IN THE EVENING 12/13/19   Biagio Borg, MD  citalopram (CELEXA) 20 MG tablet NEW PRESCRIPTION REQUEST: CITALOPRAM 20MG  - TAKE ONE TABLET BY MOUTH DAILY Patient taking differently: Take 20 mg by mouth daily.  11/30/19   Biagio Borg, MD  fluticasone furoate-vilanterol (BREO ELLIPTA) 200-25 MCG/INH AEPB Inhale 1 puff into the lungs daily. 11/30/19   Biagio Borg, MD  furosemide (LASIX) 40 MG tablet NEW PRESCRIPTION REQUEST: FUROSEMIDE 40MG  - TAKE ONE TABLET BY MOUTH IN THE MORNING AND TAKE ONE TABLET BY MOUTH AS NEEDED IN THE EVENING Patient taking differently: Take 40 mg by mouth 2 (two) times daily.  11/30/19   Biagio Borg, MD    levothyroxine (SYNTHROID) 75 MCG tablet NEW PRESCRIPTION REQUEST: LEVOTHYROXINE 75MCG - TAKE ONE TABLET BY MOUTH EARLY MORNING Patient taking differently: Take 75 mcg by mouth daily.  11/30/19   Biagio Borg, MD  OXYGEN Inhale 3 L into the lungs. 24/7    [provider]  potassium chloride (KLOR-CON) 10 MEQ tablet NEW PRESCRIPTION REQUEST: KLOR-CON 10MEQ - TAKE ONE TABLET BY MOUTH TWICE DAILY Patient taking differently: Take 10  mEq by mouth 2 (two) times daily.  11/30/19   Biagio Borg, MD  predniSONE (DELTASONE) 10 MG tablet Take 4 tablets (40 mg total) by mouth daily. 4 tablets daily x 2 days then 3 daily x 2 days then 2 daily x 2 days then 1 daily x 2 days Patient not taking: Reported on 02/13/2020 02/03/20   Caren Griffins, MD  tiotropium (SPIRIVA HANDIHALER) 18 MCG inhalation capsule NEW PRESCRIPTION REQUEST: SPIRIVA 18MCG HANDIHALER - TAKE TWO INHALATIONS OF THE CONTENTS OF ONE CAPSULE BY ONCE DAILY Patient taking differently: Place 18 mcg into inhaler and inhale daily.  11/30/19   Biagio Borg, MD  traMADol (ULTRAM) 50 MG tablet Take 1 tablet (50 mg total) by mouth 2 (two) times daily as needed. Patient taking differently: Take 50 mg by mouth 2 (two) times daily as needed for moderate pain.  11/30/19   Biagio Borg, MD    Allergies    Alendronate sodium  Review of Systems   Review of Systems  Unable to perform ROS: Mental status change    Physical Exam Updated Vital Signs BP 136/67   Pulse 98   Temp 98.4 F (36.9 C) (Oral)   Resp (!) 24   SpO2 93%   Physical Exam Constitutional:      General: She is not in acute distress.    Appearance: She is not ill-appearing, toxic-appearing or diaphoretic.     Comments: Chronically ill-appearing  HENT:     Head: Normocephalic and atraumatic.     Mouth/Throat:     Mouth: Mucous membranes are moist.  Eyes:     Pupils: Pupils are equal, round, and reactive to light.  Cardiovascular:     Rate and Rhythm: Normal rate and  regular rhythm.  Pulmonary:     Effort: Pulmonary effort is normal.     Comments: Inspiratory crackles and high-pitched expiratory wheeze Abdominal:     General: Bowel sounds are normal.     Palpations: Abdomen is soft.  Musculoskeletal:     Cervical back: Neck supple.     Right lower leg: No edema.     Left lower leg: No edema.  Neurological:     Mental Status: She is alert. She is disoriented.     Comments: Somewhat somnolent but easily awakened.     ED Results / Procedures / Treatments   Labs (all labs ordered are listed, but only abnormal results are displayed) Labs Reviewed  BASIC METABOLIC PANEL - Abnormal; Notable for the following components:      Result Value   Chloride 88 (*)    CO2 48 (*)    Glucose, Bld 113 (*)    All other components within normal limits  CBC WITH DIFFERENTIAL/PLATELET - Abnormal; Notable for the following components:   MCV 108.9 (*)    MCHC 29.4 (*)    All other components within normal limits  BLOOD GAS, ARTERIAL - Abnormal; Notable for the following components:   pH, Arterial 7.212 (*)    pCO2 arterial >120 (*)    pO2, Arterial 149 (*)    Bicarbonate 50.0 (*)    Acid-Base Excess 21.5 (*)    All other components within normal limits  SARS CORONAVIRUS 2 BY RT PCR (HOSPITAL ORDER, [facility])  CULTURE, BLOOD (ROUTINE X 2)  CULTURE, BLOOD (ROUTINE X 2)  BRAIN NATRIURETIC PEPTIDE  LACTIC ACID, PLASMA  BLOOD GAS, ARTERIAL  I-STAT ARTERIAL BLOOD GAS, ED  POC SARS CORONAVIRUS  2 AG -  ED    EKG None  Radiology DG Chest Port 1 View  Result Date: 03/01/2020 CLINICAL DATA:  Shortness of breath. EXAM: PORTABLE CHEST 1 VIEW COMPARISON:  January 29, 2020. FINDINGS: The heart size and mediastinal contours are within normal limits. No pneumothorax or pleural effusion is noted. Left lung is clear. Right upper lobe opacity is noted most likely representing scarring. The visualized skeletal structures are unremarkable.  IMPRESSION: Right upper lobe opacity is noted most likely representing scarring. No other abnormality seen in the chest. Electronically Signed   By: Marijo Conception M.D.   On: 03/01/2020 12:55    Procedures Procedures (including critical care time)  Medications Ordered in ED Medications  aspirin EC tablet 81 mg (has no administration in time range)  atorvastatin (LIPITOR) tablet 10 mg (has no administration in time range)  furosemide (LASIX) tablet 40 mg (has no administration in time range)  citalopram (CELEXA) tablet 20 mg (has no administration in time range)  levothyroxine (SYNTHROID) tablet 75 mcg (has no administration in time range)  albuterol (PROVENTIL) (2.5 MG/3ML) 0.083% nebulizer solution 2.5 mg (has no administration in time range)  fluticasone furoate-vilanterol (BREO ELLIPTA) 200-25 MCG/INH 1 puff (has no administration in time range)  tiotropium (SPIRIVA) inhalation capsule ([facility] use ONLY) 18 mcg (has no administration in time range)  enoxaparin (LOVENOX) injection 40 mg (has no administration in time range)  lactated ringers infusion (has no administration in time range)  doxycycline (VIBRA-TABS) tablet 100 mg (has no administration in time range)  methylPREDNISolone sodium succinate (SOLU-MEDROL) 125 mg/2 mL injection 60 mg (has no administration in time range)  sodium chloride flush (NS) 0.9 % injection 3 mL (has no administration in time range)  LORazepam (ATIVAN) injection 1 mg (1 mg Intravenous Given 03/01/20 1429)    ED Course  I have reviewed the triage vital signs and the nursing notes.  Pertinent labs & imaging results that were available during my care of the patient were reviewed by me and considered in my medical decision making (see chart for details).  Clinical Course as of Mar 01 1752  Thu Mar 01, 2020  1427 Acute on chronic respiratory failure with respiratory acidosis and pCO2 of >120. Patient placed on bipap and became agitated and pulled mask off.  Given ativan and masked placed on again.    [KM]  [id number] Consulted crit care to see pt.    [KM]    Clinical Course User Index [KM] Kristine Royal   MDM Rules/Calculators/A&P                      CRITICAL CARE Performed by: Alveria Apley   Total critical care time: 35 minutes  Critical care time was exclusive of separately billable procedures and treating other patients.  Critical care was necessary to treat or prevent imminent or life-threatening deterioration.  Critical care was time spent personally by me on the following activities: development of treatment plan with patient and/or surrogate as well as nursing, discussions with consultants, evaluation of patient's response to treatment, examination of patient, obtaining history from patient or surrogate, ordering and performing treatments and interventions, ordering and review of laboratory studies, ordering and review of radiographic studies, pulse oximetry and re-evaluation of patient's condition.  Final Clinical Impression(s) / ED Diagnoses Final diagnoses:  Acute on chronic respiratory failure with hypoxia and hypercapnia (HCC)  Altered mental status, unspecified altered mental status type    Rx /  DC Orders ED Discharge Orders    None       Kristine Royal 03/02/20 1115    Davonna Belling, MD 03/02/20 2340

## 2020-03-01 NOTE — ED Notes (Signed)
This RN noted BiPAP alarm when leaving bedside of another pt, came to room and found pt agitated, sitting on edge of bed without bipap or O2, assisted back into bed and reapplied bipap. EDP to bedside to reassess, RT called. Pt bipap O2% increased from 30 to 100 temporarily to increase O2 sats fro 70% to 99%. Ativan 1mg  IV provided per EDP orders placed.   Pt now resting in bed with bipap calmly.

## 2020-03-01 NOTE — H&P (Addendum)
History and Physical    Jasmine [name] [id number] DOB: July 24, 1946 DOA: 03/01/2020  PCP: Biagio Borg, MD Consultants:  Elsworth Soho - pulmonology Patient coming from:  Home - lives with Jasmine [name]; NOK: Jasmine [name], [name], [phone]  Chief Complaint:  Dyspnea  HPI: Jasmine [name] is a 74 y.o. female with medical history significant of hypothyroidism s/p RAI; HTN; COPD on 3-4L home O2 and nightly BIPAP; and chronic diastolic CHF presenting with dyspnea.  She was last admitted from 4/11 (by me) - 15 for acute on chronic respiratory failure with hypercarbia; she was treated with BIPAP and breathing treatments and discharged with home health on [place] O2.  She returns with reported noncompliance with BIPAP.  She is obtunded and minimally responsive.  I spoke with her Jasmine [name] at the time of admission.  He reports that for the past few nights she has not been using her BIPAP (maybe 1-2 hours).  She usually gets delirious and loopy when this happens.  He was up most of the night last night.  She didn't touch the coffee he brought her this morning.  She was confused with slurred speech, breathing not good.  She won't listen to him when he tells her what to do.  He has tried to talk to her about the severity of her condition "and what the outcome could be."  He appears to be frustrated.    ED Course:  Noncompliant with BIPAP, more confused and somnolent.  PCO2 >120, on BIPAP.  PCCM to consult, will recheck ABG on BIPAP.  Review of Systems: Unable to perform  Ambulatory Status:  Ambulates without assistance  COVID Vaccine Status:  Complete   Past Medical History:  Diagnosis Date  . ALLERGIC RHINITIS 05/01/2007  . Anxiety   . Arthritis    "hands" (12/28/2015)  . ASTHMA 05/01/2007   "since I was a child"  . Cancer (HCC)    LUNG  . CHF (congestive heart failure) ([place])   . Chronic bronchitis ([place])   . COPD (chronic obstructive pulmonary disease) ([place])   . DEPRESSION 07/21/2008  . DVT  (deep venous thrombosis) (HCC) 1960s   LLE  . FATIGUE 10/10/2010  . HYPERTENSION 05/01/2007  . HYPOTHYROIDISM 08/23/2009   s/p RAI  . LUMBAR RADICULOPATHY, RIGHT 08/25/2008  . On home oxygen therapy    "3-4L; qd; all the time" (12/28/2015)  . OSTEOPOROSIS 05/01/2007  . SHOULDER PAIN, LEFT 08/23/2009  . SINUSITIS, CHRONIC 10/10/2010    Past Surgical History:  Procedure Laterality Date  . APPENDECTOMY    . DILATION AND CURETTAGE OF UTERUS  multiple   history of multiple dialations and curettages and miscarriages, unfortunately never carrying a child to term  . ECTOPIC PREGNANCY SURGERY  "late '60s or early '70s  . ELECTROCARDIOGRAM  06/20/2006  . OOPHORECTOMY Right     Social History   Socioeconomic History  . Marital status: Significant Other    Spouse name: Not on file  . Number of children: 0  . Years of education: Not on file  . Highest education level: Not on file  Occupational History  . Occupation: RETIRED - Facilities manager: [employer]  Tobacco Use  . Smoking status: Former Smoker    Packs/day: 1.00    Years: 52.00    Pack years: 52.00    Types: Cigarettes    Start date: 1965    Quit date: 07/20/2016    Years since quitting: 3.6  . Smokeless tobacco: Never Used  Substance and Sexual Activity  . Alcohol use: Not Currently  . Drug use: Not Currently  . Sexual activity: Not Currently    Partners: Male    Birth control/protection: Post-menopausal  Other Topics Concern  . Not on file  Social History Narrative   Lives in [place], [place]. Lives with husband, no children, but husband has 3 kids. Ambulatory, without cane or walker. Smokes cigarettes, currently 1 ppd, smoked for >20-30 yrs. Drinks beer, ~2/day, occasionally liquor, but ? suspect minimizing. Occasional MJ and endorses crack cocaine occasionally   Social Determinants of Health   Financial Resource Strain:   . Difficulty of Paying Living Expenses:   Food Insecurity: No Food  Insecurity  . Worried About Charity fundraiser in the Last Year: Never true  . Ran Out of Food in the Last Year: Never true  Transportation Needs: No Transportation Needs  . Lack of Transportation (Medical): No  . Lack of Transportation (Non-Medical): No  Physical Activity:   . Days of Exercise per Week:   . Minutes of Exercise per Session:   Stress:   . Feeling of Stress :   Social Connections: Unknown  . Frequency of Communication with Friends and Family: Not asked  . Frequency of Social Gatherings with Friends and Family: Not asked  . Attends Religious Services: Not asked  . Active Member of Clubs or Organizations: Not asked  . Attends Archivist Meetings: Not asked  . Marital Status: Not asked  Intimate Partner Violence: Unknown  . Fear of Current or Ex-Partner: Not asked  . Emotionally Abused: Not asked  . Physically Abused: Not asked  . Sexually Abused: Not asked    Allergies  Allergen Reactions  . Alendronate Sodium Other (See Comments)    Patient does not remember this reaction    Family History  Problem Relation Age of Onset  . Lung cancer Father   . Alcohol abuse Brother   . Diabetes Brother   . Hypertension Other   . Stroke Mother   . Thyroid disease Mother   . Asthma Other        maternal aunts  . Breast cancer Neg Hx     Prior to Admission medications   Medication Sig Start Date End Date Taking? Authorizing Provider  albuterol (PROVENTIL) (2.5 MG/3ML) 0.083% nebulizer solution Take 3 mLs (2.5 mg total) by nebulization every 6 (six) hours as needed for wheezing or shortness of breath. 11/30/19   Biagio Borg, MD  albuterol (VENTOLIN HFA) 108 (90 Base) MCG/ACT inhaler INHALE 1 TO 2 PUFFS INTO THE LUNGS EVERY 6 (SIX) HOURS AS NEEDED FOR WHEEZING OR SHORTNESS OF BREATH. Patient taking differently: Inhale 2 puffs into the lungs every 6 (six) hours as needed for shortness of breath.  11/30/19   Biagio Borg, MD  aspirin EC 81 MG EC tablet Take 1  tablet (81 mg total) by mouth daily. 03/27/16   Velvet Bathe, MD  atorvastatin (LIPITOR) 10 MG tablet TAKE ONE TABLET BY MOUTH IN THE EVENING Patient taking differently: Take 10 mg by mouth daily at 6 PM. TAKE ONE TABLET BY MOUTH IN THE EVENING 12/13/19   Biagio Borg, MD  citalopram (CELEXA) 20 MG tablet NEW PRESCRIPTION REQUEST: CITALOPRAM 20MG  - TAKE ONE TABLET BY MOUTH DAILY Patient taking differently: Take 20 mg by mouth daily.  11/30/19   Biagio Borg, MD  fluticasone furoate-vilanterol (BREO ELLIPTA) 200-25 MCG/INH AEPB Inhale 1 puff into the lungs daily. 11/30/19   Jenny Reichmann,  Hunt Oris, MD  furosemide (LASIX) 40 MG tablet NEW PRESCRIPTION REQUEST: FUROSEMIDE 40MG  - TAKE ONE TABLET BY MOUTH IN THE MORNING AND TAKE ONE TABLET BY MOUTH AS NEEDED IN THE EVENING Patient taking differently: Take 40 mg by mouth 2 (two) times daily.  11/30/19   Biagio Borg, MD  levothyroxine (SYNTHROID) 75 MCG tablet NEW PRESCRIPTION REQUEST: LEVOTHYROXINE 75MCG - TAKE ONE TABLET BY MOUTH EARLY MORNING Patient taking differently: Take 75 mcg by mouth daily.  11/30/19   Biagio Borg, MD  OXYGEN Inhale 3 L into the lungs. 24/7    [provider]  potassium chloride (KLOR-CON) 10 MEQ tablet NEW PRESCRIPTION REQUEST: KLOR-CON 10MEQ - TAKE ONE TABLET BY MOUTH TWICE DAILY Patient taking differently: Take 10 mEq by mouth 2 (two) times daily.  11/30/19   Biagio Borg, MD  predniSONE (DELTASONE) 10 MG tablet Take 4 tablets (40 mg total) by mouth daily. 4 tablets daily x 2 days then 3 daily x 2 days then 2 daily x 2 days then 1 daily x 2 days Patient not taking: Reported on 02/13/2020 02/03/20   Caren Griffins, MD  tiotropium (SPIRIVA HANDIHALER) 18 MCG inhalation capsule NEW PRESCRIPTION REQUEST: SPIRIVA 18MCG HANDIHALER - TAKE TWO INHALATIONS OF THE CONTENTS OF ONE CAPSULE BY ONCE DAILY Patient taking differently: Place 18 mcg into inhaler and inhale daily.  11/30/19   Biagio Borg, MD  traMADol (ULTRAM) 50 MG tablet Take  1 tablet (50 mg total) by mouth 2 (two) times daily as needed. Patient taking differently: Take 50 mg by mouth 2 (two) times daily as needed for moderate pain.  11/30/19   Biagio Borg, MD    Physical Exam: Vitals:   03/01/20 1155 03/01/20 1215 03/01/20 1245 03/01/20 1400  BP: 126/64 (!) 144/84 137/78 136/67  Pulse: 86 81 89 98  Resp: 20 18 16  (!) 24  Temp: 98.4 F (36.9 C)     TempSrc: Oral     SpO2: 100% 100% 100% 93%      General:  Appears somnolent, minimally responsive  Eyes:  normal lids  ENT:  BIPAP mask in place  Neck:  no LAD, masses or thyromegaly  Cardiovascular:  RRR, no m/r/g. No LE edema.   Respiratory:   Poor air movement; few wheezes.  Normal respiratory effort.  Abdomen:  soft, NT, ND, NABS  Skin:  no rash or induration seen on limited exam  Musculoskeletal:   no bony abnormality  Psychiatric:  Minimally responsive to sternal rub  Neurologic:  Unable to perform   Radiological Exams on Admission: DG Chest Port 1 View  Result Date: 03/01/2020 CLINICAL DATA:  Shortness of breath. EXAM: PORTABLE CHEST 1 VIEW COMPARISON:  January 29, 2020. FINDINGS: The heart size and mediastinal contours are within normal limits. No pneumothorax or pleural effusion is noted. Left lung is clear. Right upper lobe opacity is noted most likely representing scarring. The visualized skeletal structures are unremarkable. IMPRESSION: Right upper lobe opacity is noted most likely representing scarring. No other abnormality seen in the chest. Electronically Signed   By: Marijo Conception M.D.   On: 03/01/2020 12:55    EKG: pending   Labs on Admission: I have personally reviewed the available labs and imaging studies at the time of the admission.  Pertinent labs:   CO2 48; 39 on 4/12 Glucose 113 Lactate 0.7 Unremarkable CBC ABG: 7.212/>120/149/50.0 - baseline pCO2 about 75   Assessment/Plan Active Problems:   Essential hypertension  Hypothyroidism following radioiodine  therapy   Tobacco abuse disorder   Chronic diastolic congestive heart failure (HCC)   Hyperglycemia   COPD with acute exacerbation (HCC)   Chronic low back pain   Acute on chronic respiratory failure with hypercapnia (HCC)    Acute on chronic respiratory failure, likely associated with non-compliance, possibly also COPD exacerbation -Patient with periodic admissions for the same -She is supposed to be on 3L home O2 and nightly BIPAP -Her Jasmine [name] reports that she is often non-compliant with the BIPAP and this leads to confusion and then hospitalization -PCCM consult -Will admit to [facility] with ongoing BIPAP -Due to concern for possible COPD exacerbation, will start Doxy and Solumedrol -Continue Albuterol, Breo Ellipta, Spiriva -At some point, her lungs are likely unable to compensate for extreme hypercarbia and she is at high risk for mortality; her husband is aware and has discussed with her, as well -Palliative care consultation for ongoing discussion  Chronic diastolic CHF -Continue ASA, Lasix PO when able to take PO  HTN -She does not appear to be taking medication for this issue at this time  Hyperglycemia -May be stress response  -Recent A1c was 5.9 and so she does have borderline DM; recurrent steroid dosing likely does not help -Will follow with fasting glucose for now  HLD -Continue Lipitor  Hypothyroidism -Recent TSH suppressed at 0.181 - TSH dose does not appear to have been decreased -Continue Synthroid at current dose for now and recheck TSH; if still suppressed, needs reduced dose of Synthroid  Chronic pain -I have reviewed this patient in the [place] Controlled Substances Reporting System.  She is receiving intermittent prescriptions for Tramadol from only one provider. -She is not at particularly high risk of opioid misuse, diversion, or overdose. -Continue Celexa  Tobacco dependence -Husband reports that she is not smoking at this  time  Goals of care -Patient is confirmed to be DNR -This is her 3rd admission in the last 6 months -She has a pattern of noncompliance and her husband is concerned for developing dementia -Palliative care consulted during prior hospitalization and palliative care/outpatient TOC team are involved to help prevent need for recurrent admission -Reconsult palliative care   Note: This patient has been tested and is negative for the novel coronavirus COVID-19.    Total critical care time: 65 minutes Critical care time was exclusive of separately billable procedures and treating other patients. Critical care was necessary to treat or prevent imminent or life-threatening deterioration. Critical care was time spent personally by me on the following activities: development of treatment plan with patient and/or surrogate as well as nursing, discussions with consultants, evaluation of patient's response to treatment, examination of patient, obtaining history from patient or surrogate, ordering and performing treatments and interventions, ordering and review of laboratory studies, ordering and review of radiographic studies, pulse oximetry and re-evaluation of patient's condition.     DVT prophylaxis:   Lovenox  Code Status:  DNR - confirmed with family Family Communication: None present; I spoke with the patient's Jasmine [name] by telephone at the time of admission. Disposition Plan:The patient is from: home             Anticipated d/c is to: home with [facility] services once her respiratory issues have been resolved.             Anticipated d/c date will depend on clinical response to treatment, likely several days             Patient is  currently: acutely ill, critically so Consults called: PCCM; Palliative care; TOC team Admission status:  Admit - It is my clinical opinion that admission to INPATIENT is reasonable and necessary because of the expectation that this patient will require hospital care  that crosses at least 2 midnights to treat this condition based on the medical complexity of the problems presented.  Given the aforementioned information, the predictability of an adverse outcome is felt to be significant.  Karmen Bongo MD [facility]   How to contact the [facility] Attending or Consulting provider [place] or covering provider during after hours [place], for this patient?  1. Check the care team in [facility] and look for a) attending/consulting TRH provider listed and b) the [facility] team listed 2. Log into [email or web address] and use [place] universal password to access. If you do not have the password, please contact the hospital operator. 3. Locate the [facility] provider you are looking for under [facility] and page to a number that you can be directly reached. 4. If you still have difficulty reaching the provider, please page the [facility] (Director on Call) for the Hospitalists listed on amion for assistance.   03/01/2020, 3:34 PM

## 2020-03-02 DIAGNOSIS — J9622 Acute and chronic respiratory failure with hypercapnia: Principal | ICD-10-CM

## 2020-03-02 DIAGNOSIS — J9621 Acute and chronic respiratory failure with hypoxia: Secondary | ICD-10-CM

## 2020-03-02 LAB — RAPID URINE DRUG SCREEN, HOSP PERFORMED
Amphetamines: NOT DETECTED
Barbiturates: NOT DETECTED
Benzodiazepines: NOT DETECTED
Cocaine: NOT DETECTED
Opiates: NOT DETECTED
Tetrahydrocannabinol: NOT DETECTED

## 2020-03-02 LAB — CBC
HCT: 41.5 % (ref 36.0–46.0)
Hemoglobin: 12.4 g/dL (ref 12.0–15.0)
MCH: 31.8 pg (ref 26.0–34.0)
MCHC: 29.9 g/dL — ABNORMAL LOW (ref 30.0–36.0)
MCV: 106.4 fL — ABNORMAL HIGH (ref 80.0–100.0)
Platelets: 145 10*3/uL — ABNORMAL LOW (ref 150–400)
RBC: 3.9 MIL/uL (ref 3.87–5.11)
RDW: 13.9 % (ref 11.5–15.5)
WBC: 3.8 10*3/uL — ABNORMAL LOW (ref 4.0–10.5)
nRBC: 0 % (ref 0.0–0.2)

## 2020-03-02 LAB — BASIC METABOLIC PANEL
Anion gap: 12 (ref 5–15)
BUN: 13 mg/dL (ref 8–23)
CO2: 40 mmol/L — ABNORMAL HIGH (ref 22–32)
Calcium: 9.7 mg/dL (ref 8.9–10.3)
Chloride: 91 mmol/L — ABNORMAL LOW (ref 98–111)
Creatinine, Ser: 0.77 mg/dL (ref 0.44–1.00)
GFR calc Af Amer: >60 mL/min (ref >60)
GFR calc non Af Amer: >60 mL/min (ref >60)
Glucose, Bld: 123 mg/dL — ABNORMAL HIGH (ref 70–99)
Potassium: 4.5 mmol/L (ref 3.5–5.1)
Sodium: 143 mmol/L (ref 135–145)

## 2020-03-02 MED ORDER — IPRATROPIUM-ALBUTEROL 0.5-2.5 (3) MG/3ML IN SOLN
3.0000 mL | Freq: Two times a day (BID) | RESPIRATORY_TRACT | Status: DC
  Administered 2020-03-02 – 2020-03-05 (×6): 3 mL via RESPIRATORY_TRACT
  Filled 2020-03-02 (×6): qty 3

## 2020-03-02 NOTE — Progress Notes (Signed)
PROGRESS NOTE    Jasmine [name]  [id number] DOB: [birth year] DOA: 03/01/2020 PCP: Biagio Borg, MD  Brief Narrative: Jasmine Daya Booneis a 74 y.o.femalewith medical history ofhypothyroidism s/p RAI; HTN; chronic respiratory failure, COPD on 3-4L home O2 and nightly BIPAP; and chronic diastolic CHF presenting withdyspnea. She was last admitted from 4/11- 15 for acute on chronic respiratory failure with hypercarbia; she was treated with BIPAP and breathing treatments and discharged with home health on [place] O2. -returned to ED obtunded and minimally responsive, ex-husband reported poor compliance with BiPAP ED Course:  Noncompliant with BIPAP, more confused and somnolent.  PCO2 >120, started on BIPAP.    Chest x-ray with scarring, no acute findings  Assessment & Plan:  Acute on chronic hypercarbic respiratory failure Severe COPD with exacerbation Noncompliance Frequent re hospitalizations -Supposed to be on 3 L home O2 and nightly BiPAP, ex-husband reported noncompliance with BiPAP which leads to hypercarbia and confusion followed by rehospitalization -Poor air movement, no wheezing at this time, chest x-ray without acute findings -Continue IV Solu-Medrol, duo nebs and azithromycin -Discontinue BiPAP this morning and resume nightly -Case management consult, patient reports having some issues with her BiPAP/trilogy machine, which seems bizarre as she is able to use it for a few hours -Overall prognosis is poor -Needs close pulmonary and palliative follow-up  Chronic diastolic CHF -Continue ASA, oral Lasix, clinically appears euvolemic  HTN -Stable  Hyperglycemia, borderline diabetes -Recent A1c was 5.9 and so she does have borderline DM; recurrent steroid dosing likely does not help -CBG stable, continue sliding scale insulin for now  HLD -Continue Lipitor  Hypothyroidism -Recent TSH suppressed at 0.181 - TSH dose does not appear to have been decreased -Continue  Synthroid at current dose for now  Chronic pain -Continue tramadol and Celexa  Tobacco dependence -Reportedly quit smoking  Goals of care -Now DNR, frequent rehospitalizations and noncompliance with recurrent respiratory failure continues to be an ongoing issue  -Close palliative care and pulmonary follow-up needed -Eventually will need consideration of home hospice down the road  DVT prophylaxis:Lovenox  Code Status:DNR Family Communication:No family at bedside, admitting MD spoke with the patient'sex-husband  Status is: Inpatient  Remains inpatient appropriate because:Inpatient level of care appropriate due to severity of illness recurrent hypercarbic respiratory failure, COPD exacerbation   Dispo: The patient is from: Home              Anticipated d/c is to: Home              Anticipated d/c date is: 2 days              Patient currently is not medically stable to d/c.    Consultants:   Pulm   Procedures:   Antimicrobials:    Subjective: -Breathing better, wants to eat, tells me that her BiPAP machine at home is not working  Objective: Vitals:   03/02/20 0750 03/02/20 0752 03/02/20 0753 03/02/20 0851  BP:    (!) 112/59  Pulse:   74 86  Resp:   16 20  Temp:    98.4 F (36.9 C)  TempSrc:    Oral  SpO2: 96% 96% 97% 97%  Weight:      Height:        Intake/Output Summary (Last 24 hours) at 03/02/2020 0954 Last data filed at 03/02/2020 0600 Gross per 24 hour  Intake 1176.84 ml  Output --  Net 1176.84 ml   Filed Weights   03/01/20 1900  Weight: 53.3  kg    Examination:  General exam: Chronically ill elderly female, laying in bed, awake alert oriented to self and partly to place, BiPAP on Respiratory system: Poor air movement bilaterally, no expiratory wheezes  cardiovascular system: S1 & S2 heard, RRR  Gastrointestinal system: Abdomen is nondistended, soft and nontender.Normal bowel sounds heard. Central nervous system: Moves all  extremities, no localizing signs Extremities: No edema Skin: No rashes, lesions or ulcers Psychiatry: Poor insight and judgment   Data Reviewed:   CBC: Recent Labs  Lab 03/01/20 1324 03/02/20 0313  WBC 5.1 3.8*  NEUTROABS 3.5  --   HGB 13.0 12.4  HCT 44.2 41.5  MCV 108.9* 106.4*  PLT 172 [id number]*   Basic Metabolic Panel: Recent Labs  Lab 03/01/20 1324 03/02/20 0313  NA 141 143  K 4.7 4.5  CL 88* 91*  CO2 48* 40*  GLUCOSE 113* 123*  BUN 10 13  CREATININE 0.69 0.77  CALCIUM 9.6 9.7   GFR: Estimated Creatinine Clearance: 43.6 mL/min (by C-G formula based on SCr of 0.77 mg/dL). Liver Function Tests: No results for input(s): AST, ALT, ALKPHOS, BILITOT, PROT, ALBUMIN in the last 168 hours. No results for input(s): LIPASE, AMYLASE in the last 168 hours. No results for input(s): AMMONIA in the last 168 hours. Coagulation Profile: No results for input(s): INR, PROTIME in the last 168 hours. Cardiac Enzymes: No results for input(s): CKTOTAL, CKMB, CKMBINDEX, TROPONINI in the last 168 hours. BNP (last 3 results) No results for input(s): PROBNP in the last 8760 hours. HbA1C: No results for input(s): HGBA1C in the last 72 hours. CBG: Recent Labs  Lab 03/01/20 1855  GLUCAP 110*   Lipid Profile: No results for input(s): CHOL, HDL, LDLCALC, TRIG, CHOLHDL, LDLDIRECT in the last 72 hours. Thyroid Function Tests: No results for input(s): TSH, T4TOTAL, FREET4, T3FREE, THYROIDAB in the last 72 hours. Anemia Panel: No results for input(s): VITAMINB12, FOLATE, FERRITIN, TIBC, IRON, RETICCTPCT in the last 72 hours. Urine analysis:    Component Value Date/Time   COLORURINE YELLOW 01/29/2020 0532   APPEARANCEUR CLEAR 01/29/2020 0532   LABSPEC 1.025 01/29/2020 0532   PHURINE 5.0 01/29/2020 0532   GLUCOSEU NEGATIVE 01/29/2020 0532   GLUCOSEU NEGATIVE 09/20/2018 1426   HGBUR NEGATIVE 01/29/2020 0532   BILIRUBINUR NEGATIVE 01/29/2020 0532   BILIRUBINUR negative 08/11/2011 1358    KETONESUR 5 (A) 01/29/2020 0532   PROTEINUR 30 (A) 01/29/2020 0532   UROBILINOGEN 0.2 09/20/2018 1426   NITRITE NEGATIVE 01/29/2020 0532   LEUKOCYTESUR NEGATIVE 01/29/2020 0532   Sepsis Labs: @LABRCNTIP (procalcitonin:4,lacticidven:4)  ) Recent Results (from the past 240 hour(s))  SARS Coronavirus 2 by RT PCR (hospital order, performed in [facility] hospital lab) Nasopharyngeal Nasopharyngeal Swab     Status: None   Collection Time: 03/01/20 12:17 PM   Specimen: Nasopharyngeal Swab  Result Value Ref Range Status   SARS Coronavirus 2 NEGATIVE NEGATIVE Final    Comment: (NOTE) SARS-CoV-2 target nucleic acids are NOT DETECTED. The SARS-CoV-2 RNA is generally detectable in upper and lower respiratory specimens during the acute phase of infection. The lowest concentration of SARS-CoV-2 viral copies this assay can detect is 250 copies / mL. A negative result does not preclude SARS-CoV-2 infection and should not be used as the sole basis for treatment or other patient management decisions.  A negative result may occur with improper specimen collection / handling, submission of specimen other than nasopharyngeal swab, presence of viral mutation(s) within the areas targeted by this assay, and inadequate number of  viral copies (<250 copies / mL). A negative result must be combined with clinical observations, patient history, and epidemiological information. Fact Sheet for Patients:   StrictlyIdeas.no Fact Sheet for Healthcare Providers: BankingDealers.co.za This test is not yet approved or cleared  by the [place] FDA and has been authorized for detection and/or diagnosis of SARS-CoV-2 by FDA under an Emergency Use Authorization (EUA).  This EUA will remain in effect (meaning this test can be used) for the duration of the COVID-19 declaration under Section 564(b)(1) of the Act, 21 U.S.C. section 360bbb-3(b)(1), unless the authorization is  terminated or revoked sooner. Performed at [facility], [place] [address] [place], [place] [address]          Radiology Studies: DG Chest Port 1 View  Result Date: 03/01/2020 CLINICAL DATA:  Shortness of breath. EXAM: PORTABLE CHEST 1 VIEW COMPARISON:  January 29, 2020. FINDINGS: The heart size and mediastinal contours are within normal limits. No pneumothorax or pleural effusion is noted. Left lung is clear. Right upper lobe opacity is noted most likely representing scarring. The visualized skeletal structures are unremarkable. IMPRESSION: Right upper lobe opacity is noted most likely representing scarring. No other abnormality seen in the chest. Electronically Signed   By: Marijo Conception M.D.   On: 03/01/2020 12:55        Scheduled Meds: . arformoterol  15 mcg Nebulization BID  . aspirin EC  81 mg Oral Daily  . atorvastatin  10 mg Oral q1800  . budesonide (PULMICORT) nebulizer solution  0.5 mg Nebulization BID  . citalopram  20 mg Oral Daily  . enoxaparin (LOVENOX) injection  40 mg Subcutaneous Q24H  . furosemide  40 mg Oral BID  . ipratropium-albuterol  3 mL Nebulization Q6H  . levothyroxine  75 mcg Oral Daily  . methylPREDNISolone (SOLU-MEDROL) injection  60 mg Intravenous Q12H  . sodium chloride flush  3 mL Intravenous Q12H   Continuous Infusions: . azithromycin 500 mg (03/01/20 1954)     LOS: 1 day    Time spent: 67min  Domenic Polite, MD [facility] 03/02/2020, 9:54 AM

## 2020-03-02 NOTE — TOC Initial Note (Signed)
Transition of Care [facility]) - Initial/Assessment Note    Patient Details  Name: Jasmine [name] MRN: [id number] Date of Birth: [birth year]  Transition of Care [facility]) CM/SW Contact:    Pollie Friar, RN Phone Number: 03/02/2020, 3:25 PM  Clinical Narrative:                 CM met with the patient at the bedside. She states she feels her trilogy oxygen tubing is not providing oxygen correctly and has holes in it. CM has updated Zack with [employer] and they will assess the trilogy machine once pt's ex spouse brings to the hospital. He will also f/u with the tubing issue. Zack with [employer] was able to pull her usage records and states she was using the trilogy up until this past Saturday and then she really hasnt used it since.  Pt states she does her own medications at home.  She states her ex spouse provides the needed transportation.  She says she is active with [facility] for [facility].  TOC following for d/c needs.   Expected Discharge Plan: [place] Barriers to Discharge: Continued Medical Work up   Patient Goals and CMS Choice        Expected Discharge Plan and Services Expected Discharge Plan: [place]   Discharge Planning Services: CM Consult   Living arrangements for the past 2 months: Single Family Home                                      Prior Living Arrangements/Services Living arrangements for the past 2 months: Single Family Home Lives with:: Other (Comment)(ex spouse) Patient language and need for interpreter reviewed:: Yes Do you feel safe going back to the place where you live?: Yes      Need for Family Participation in Patient Care: Yes (Comment) Care giver support system in place?: Yes (comment)(ex spouse is with her most of the time) Current home services: DME, Home PT([facility]// DME: Trilogy, oxygen, wheelchair, walker,) Criminal Activity/Legal Involvement Pertinent to Current Situation/Hospitalization: No - Comment  as needed  Activities of Daily Living      Permission Sought/Granted                  Emotional Assessment Appearance:: Appears stated age Attitude/Demeanor/Rapport: Engaged Affect (typically observed): Accepting Orientation: : Oriented to Self, Oriented to Place, Oriented to Situation   Psych Involvement: No (comment)  Admission diagnosis:  Altered mental status, unspecified altered mental status type [R41.82] Acute on chronic respiratory failure with hypoxia and hypercapnia (HCC) [U82.80, J96.22] Patient Active Problem List   Diagnosis Date Noted  . Acute on chronic respiratory failure with hypercapnia ([place]) 01/29/2020  . Malnutrition of moderate degree 08/16/2019  . Type II respiratory failure ([place]) 08/14/2019  . Respiratory acidosis 03/07/2019  . Goals of care, counseling/discussion 09/20/2018  . Edema due to congestive heart failure ([place]) 07/27/2018  . Palliative care by specialist   . Shortness of breath   . Chronic low back pain 04/30/2018  . COPD with acute exacerbation ([place])   . Hypothyroidism   . Anxiety   . Respiratory failure with hypercapnia ([place]) 03/24/2018  . Hypercarbia 03/22/2018  . Polysubstance abuse ([place]) 01/26/2018  . Acute on chronic respiratory failure with hypoxia and hypercapnia ([place]) 01/07/2018  . Aspiration into airway 06/03/2017  . Rash 07/12/2016  . Bilateral hearing loss 06/05/2016  .  Hyperglycemia 05/21/2016  . Abnormal LFTs 03/24/2016  . Elevated blood sugar 03/24/2016  . Solitary pulmonary nodule 01/09/2016  . Chronic respiratory failure with hypoxia ([place])   . On home oxygen therapy   . Chronic diastolic congestive heart failure ([place])   . Bacteremia Step viridans 12/27/2015  . Tobacco abuse disorder 12/25/2015  . Hypothyroidism following radioiodine therapy 09/26/2014  . Protein-calorie malnutrition, severe ([place]) 06/23/2014  . Cor pulmonale ([place]) 01/12/2013  . COPD (chronic obstructive pulmonary disease) ([place]) 11/29/2011  .  Macrocytosis without anemia 11/09/2011  . Palliative care encounter 04/09/2011  . SINUSITIS, CHRONIC 10/10/2010  . FATIGUE 10/10/2010  . Depression with anxiety 07/21/2008  . Essential hypertension 05/01/2007  . ALLERGIC RHINITIS 05/01/2007  . Asthma 05/01/2007  . OSTEOPOROSIS 05/01/2007   PCP:  Biagio Borg, MD Pharmacy:   [place], [place] [address] Phone: [phone] Fax: [phone]  [facility] [place], [place] - [address] [place] [address] Phone: [phone] Fax: [phone]     Social Determinants of Health (SDOH) Interventions    Readmission Risk Interventions Readmission Risk Prevention Plan 12/07/2019 08/17/2019 03/10/2019  Transportation Screening Complete Complete Complete  PCP or Specialist Appt within 3-5 Days Complete - -  HRI or Home Care Consult Complete - -  Social Work Consult for [place] Planning/Counseling Complete - -  Palliative Care Screening Not Applicable - -  Medication Review (RN Care Manager) Complete - Complete  PCP or Specialist appointment within 3-5 days of discharge - - Complete  HRI or [place] - - Complete  SW Recovery Care/Counseling Consult - - Complete  [place] - - Not Applicable  Some recent data might be hidden

## 2020-03-02 NOTE — Consult Note (Signed)
   [facility] Inpatient Consult   03/02/2020  Jasmine [name] 1946-06-23 [id number]  Patient is currently active with [place] [facility] for chronic disease management services.  Patient has been engaged by a [place] Charity fundraiser.  Will continue to follow for progression and disposition plans and update community RN.  Of note, [facility] Care Management services does not replace or interfere with any services that are arranged by inpatient case management or social work.  Netta Cedars, MSN, [facility] Liaison Nurse Mobile Phone [phone]  Toll free office [phone]

## 2020-03-02 NOTE — Progress Notes (Signed)
Vitals taken at a resting state

## 2020-03-02 NOTE — Progress Notes (Signed)
   03/02/20 1623  Assess: MEWS Score  Temp 99.2 F (37.3 C)  BP (!) 122/59  Resp 17  O2 Device Nasal Cannula  O2 Flow Rate (L/min) 3 L/min  Assess: MEWS Score  MEWS Temp 0  MEWS Systolic 0  MEWS Pulse 1  MEWS RR 0  MEWS LOC 0  MEWS Score 1  MEWS Score Color Green  Assess: if the MEWS score is Yellow or Red  Were vital signs taken at a resting state? No  Focused Assessment Documented focused assessment  Early Detection of Sepsis Score *See Row Information* Low  MEWS guidelines implemented *See Row Information* No, vital signs rechecked

## 2020-03-02 NOTE — Progress Notes (Signed)
NAME:  Jasmine [name], MRN:  [id number], DOB:  1946/02/16, LOS: 1 ADMISSION DATE:  03/01/2020, CONSULTATION DATE:  03/01/20 REFERRING MD:  Dr. Lorin Mercy, CHIEF COMPLAINT:  AMS    Brief History   74 y/o F with 4L O2 dependent chronic hypoxic respiratory failure admitted on 5/13 with 3-4 day history of worsening lethargy / alteration in mental status.  ABG on admit notable for acute on chronic hypercarbic respiratory failure.    History of present illness   74 y/o F who presented to [facility] on 5/13 with reports of lethargy, increasing altered mental status over the past 3-4 days.    At baseline, she is 4L O2 dependent COPD (FEV1 0.47L/30%) and has a Facilities manager.  She has frequent admissions for hypercarbic respiratory failure (note bicarb on BMP from discharge was 39).  Most recent admit from 4/11-4/15 for hypercarbic respiratory failure.  Upon chart review, she frequently reports that theTrilogy is broken and home health has reportedly said they can not fix it.  She has previously reported intermittent use of NIMV at best because of mask fit. She is a DNR.  She was seen in the Pulmonary Office on 4/26 for hospital follow up.  She returns with reports of increasing lethargy over several days prior to presentation. In the ER, ABG showed 7.212 / >120 / 149 / 50.  She was placed on BiPAP, after approximately 30 minutes on BiPAP she began to wake and pulled the mask off.  She was then treated with 1mg  IV ativan and put back on bipap.  Labs notable for Na 141, K 4.7, CL 88, CO2 48, BUN 10, Sr Cr 0.69, BNP 36, lactic acid 0.7, WBC 5.1, Hgb 13, platelets 172. CXR images reviewed with RUL opacity, suspected scarring, no infiltrate. Afebrile.  Hemodynamically stable in ER.    PCCM consulted for evaluation of hypercarbic respiratory failure.   Past Medical History  4L O2 Dependent COPD   Significant Hospital Events   5/13 Admit with hypercarbic resp fx  Consults:  PCCM   Procedures:    Significant Diagnostic  Tests:    Micro Data:  COVID 5/13 >> negative  BCx2 5/13 >>   Antimicrobials:  Azithromycin 5/13 >>  Interim history/subjective:  Pt awake, reports feeling better.  States she was unaware of even coming into the hospital.   Objective   Blood pressure (!) 112/59, pulse 86, temperature 98.4 F (36.9 C), temperature source Oral, resp. rate 20, height 5' (1.524 m), weight 53.3 kg, SpO2 97 %.    FiO2 (%):  [30 %-40 %] 30 %   Intake/Output Summary (Last 24 hours) at 03/02/2020 1200 Last data filed at 03/02/2020 0600 Gross per 24 hour  Intake 1176.84 ml  Output -  Net 1176.84 ml   Filed Weights   03/01/20 1900  Weight: 53.3 kg    Examination General: elderly female lying in bed watching TV in NAD HEENT: MM pink/moist, [place] O2 Neuro: AAOx4, speech clear, MAE CV: s1s2 rrr, no m/r/g PULM: non-labored on [place] O2, lungs bilaterally distant but clear  GI: soft, bsx4 active  Extremities: warm/dry, no LE edema  Skin: no rashes or lesions  Resolved Hospital Problem list     Assessment & Plan:   Acute on chronic hypoxic and hypercarbic respiratory failure in the setting of medical non-compliance and possible AECOPD -continue BiPAP QHS and PRN during day time sleep  -avoid all sedating medications  -continue pulmicort, brovana BID  -reduce duoneb to BID -hold home spiriva,  breo for now  -wean O2 for sats 88-94% -have asked patient to bring trilogy in to let us take a look at it and to observe here on the machine overnight -DNR, DNI  Remainder per primary team.  Labs   CBC: Recent Labs  Lab 03/01/20 1324 03/02/20 0313  WBC 5.1 3.8*  NEUTROABS 3.5  --   HGB 13.0 12.4  HCT 44.2 41.5  MCV 108.9* 106.4*  PLT 172 145*    Basic Metabolic Panel: Recent Labs  Lab 03/01/20 1324 03/02/20 0313  NA 141 143  K 4.7 4.5  CL 88* 91*  CO2 48* 40*  GLUCOSE 113* 123*  BUN 10 13  CREATININE 0.69 0.77  CALCIUM 9.6 9.7   GFR: Estimated Creatinine Clearance: 43.6 mL/min (by  C-G formula based on SCr of 0.77 mg/dL). Recent Labs  Lab 03/01/20 1324 03/01/20 1325 03/02/20 0313  WBC 5.1  --  3.8*  LATICACIDVEN  --  0.7  --     Liver Function Tests: No results for input(s): AST, ALT, ALKPHOS, BILITOT, PROT, ALBUMIN in the last 168 hours. No results for input(s): LIPASE, AMYLASE in the last 168 hours. No results for input(s): AMMONIA in the last 168 hours.  ABG    Component Value Date/Time   PHART 7.276 (L) 03/01/2020 1532   PCO2ART 110 (HH) 03/01/2020 1532   PO2ART 73.5 (L) 03/01/2020 1532   HCO3 49.4 (H) 03/01/2020 1532   TCO2 49 (H) 01/29/2020 1116   O2SAT 93.9 03/01/2020 1532     Coagulation Profile: No results for input(s): INR, PROTIME in the last 168 hours.  Cardiac Enzymes: No results for input(s): CKTOTAL, CKMB, CKMBINDEX, TROPONINI in the last 168 hours.  HbA1C: Hgb A1c MFr Bld  Date/Time Value Ref Range Status  01/29/2020 05:55 PM 5.9 (H) 4.8 - 5.6 % Final    Comment:    (NOTE)         Prediabetes: 5.7 - 6.4         Diabetes: >6.4         Glycemic control for adults with diabetes: <7.0   09/20/2018 02:26 PM 6.1 4.6 - 6.5 % Final    Comment:    Glycemic Control Guidelines for People with Diabetes:Non Diabetic:  <6%Goal of Therapy: <7%Additional Action Suggested:  >8%     CBG: Recent Labs  Lab 03/01/20 1855  GLUCAP Pole Ojea, MSN, NP-C [facility] 03/02/2020, 12:00 PM   Please see [email or web address] for pager details.

## 2020-03-03 LAB — CBC
HCT: 37.3 % (ref 36.0–46.0)
Hemoglobin: 11.3 g/dL — ABNORMAL LOW (ref 12.0–15.0)
MCH: 31.2 pg (ref 26.0–34.0)
MCHC: 30.3 g/dL (ref 30.0–36.0)
MCV: 103 fL — ABNORMAL HIGH (ref 80.0–100.0)
Platelets: 151 10*3/uL (ref 150–400)
RBC: 3.62 MIL/uL — ABNORMAL LOW (ref 3.87–5.11)
RDW: 14.3 % (ref 11.5–15.5)
WBC: 7.2 10*3/uL (ref 4.0–10.5)
nRBC: 0 % (ref 0.0–0.2)

## 2020-03-03 LAB — BASIC METABOLIC PANEL
Anion gap: 14 (ref 5–15)
BUN: 23 mg/dL (ref 8–23)
CO2: 34 mmol/L — ABNORMAL HIGH (ref 22–32)
Calcium: 9 mg/dL (ref 8.9–10.3)
Chloride: 90 mmol/L — ABNORMAL LOW (ref 98–111)
Creatinine, Ser: 0.92 mg/dL (ref 0.44–1.00)
GFR calc Af Amer: >60 mL/min (ref >60)
GFR calc non Af Amer: >60 mL/min (ref >60)
Glucose, Bld: 144 mg/dL — ABNORMAL HIGH (ref 70–99)
Potassium: 5.3 mmol/L — ABNORMAL HIGH (ref 3.5–5.1)
Sodium: 138 mmol/L (ref 135–145)

## 2020-03-03 MED ORDER — METHYLPREDNISOLONE SODIUM SUCC 40 MG IJ SOLR
40.0000 mg | Freq: Two times a day (BID) | INTRAMUSCULAR | Status: DC
  Administered 2020-03-03 – 2020-03-04 (×2): 40 mg via INTRAVENOUS
  Filled 2020-03-03 (×2): qty 1

## 2020-03-03 MED ORDER — AZITHROMYCIN 250 MG PO TABS
250.0000 mg | ORAL_TABLET | Freq: Every day | ORAL | Status: DC
  Administered 2020-03-03 – 2020-03-05 (×3): 250 mg via ORAL
  Filled 2020-03-03 (×3): qty 1

## 2020-03-03 MED ORDER — SODIUM ZIRCONIUM CYCLOSILICATE 5 G PO PACK
5.0000 g | PACK | Freq: Two times a day (BID) | ORAL | Status: AC
  Administered 2020-03-03 (×2): 5 g via ORAL
  Filled 2020-03-03 (×2): qty 1

## 2020-03-03 NOTE — Progress Notes (Signed)
Potassium 5.3, [place] informed

## 2020-03-03 NOTE — Plan of Care (Signed)

## 2020-03-03 NOTE — Progress Notes (Addendum)
PROGRESS NOTE    Jasmine [name]  [id number] DOB: Nov 20, 1945 DOA: 03/01/2020 PCP: Biagio Borg, MD  Brief Narrative: Divine Maloof Booneis a 74 y.o.femalewith medical history ofhypothyroidism s/p RAI; HTN; chronic respiratory failure, COPD on 3-4L home O2 and nightly BIPAP; and chronic diastolic CHF presenting withdyspnea. She was last admitted from 4/11- 15 for acute on chronic respiratory failure with hypercarbia; she was treated with BIPAP and breathing treatments and discharged with home health on  O2. -returned to ED obtunded and minimally responsive, ex-husband reported poor compliance with BiPAP ED Course:  Noncompliant with BIPAP, more confused and somnolent.  PCO2 >120, started on BIPAP.    Chest x-ray with scarring, no acute findings  Assessment & Plan:  Acute on chronic hypercarbic respiratory failure Severe COPD with exacerbation Noncompliance Frequent re hospitalizations -Supposed to be on 3 L home O2 and nightly BiPAP, ex-husband reported noncompliance with BiPAP which leads to hypercarbia and confusion followed by rehospitalization -Poor air movement, no wheezing at this time, chest x-ray without acute findings -Clinically improving after restarting BiPAP nightly, also on IV Solu-Medrol for COPD exacerbation will cut down dose, continue duo nebs -Discussed with RT to use her home BiPAP/trilogy machine at night here in the hospital, case management and home respiratory therapy eval requested to troubleshoot. -Ambulate, PT OT -Needs close pulmonary and palliative follow-up  Mild hyperkalemia -Etiology is unclear, Lokelma today, BMP in a.m.  Chronic diastolic CHF -Continue ASA, oral Lasix, clinically appears euvolemic  HTN -Stable  Hyperglycemia, borderline diabetes -Recent A1c was 5.9 and so she does have borderline DM; recurrent steroid dosing likely does not help -CBG stable, continue sliding scale insulin for now  HLD -Continue  Lipitor  Hypothyroidism -Recent TSH suppressed at 0.181 - TSH dose does not appear to have been decreased -Continue Synthroid at current dose for now  Chronic pain -Continue tramadol and Celexa  Tobacco dependence -Reportedly quit smoking  Goals of care -Now DNR, frequent rehospitalizations and noncompliance with recurrent respiratory failure continues to be an ongoing issue  -Close palliative care and pulmonary follow-up needed -Eventually will need consideration of home hospice down the road  DVT prophylaxis:Lovenox  Code Status:DNR Family Communication:No family at bedside, admitting MD spoke with the patient'sex-husband  Status is: Inpatient  Remains inpatient appropriate because:Inpatient level of care appropriate due to severity of illness recurrent hypercarbic respiratory failure, COPD exacerbation   Dispo: The patient is from: Home              Anticipated d/c is to: Home              Anticipated d/c date is: 2 days              Patient currently is not medically stable to d/c.    Consultants:   Pulm   Procedures:   Antimicrobials:    Subjective: -Feels better, breathing well, tolerating hospital BiPAP well did not use her home machine  Objective: Vitals:   03/02/20 2034 03/03/20 0016 03/03/20 0342 03/03/20 0830  BP:  (!) 107/58 114/63 110/75  Pulse:  77 66 91  Resp:  18 18 18   Temp:  98 F (36.7 C) 97.8 F (36.6 C) 98 F (36.7 C)  TempSrc:  Axillary Axillary Oral  SpO2: 98% 98% 98%   Weight:      Height:        Intake/Output Summary (Last 24 hours) at 03/03/2020 1118 Last data filed at 03/03/2020 0900 Gross per 24 hour  Intake 240  ml  Output --  Net 240 ml   Filed Weights   03/01/20 1900  Weight: 53.3 kg    Examination:  General exam: Chronically ill elderly female, laying in bed, AAOx3 HEENT no JVD Lungs improving air movement bilaterally, no wheezes CVS: S1-S2, regular rate rhythm Abdomen is soft nontender, bowel  sounds present Extremities no edema Skin: No rashes, lesions or ulcers Psychiatry: Poor insight and judgment   Data Reviewed:   CBC: Recent Labs  Lab 03/01/20 1324 03/02/20 0313 03/03/20 0404  WBC 5.1 3.8* 7.2  NEUTROABS 3.5  --   --   HGB 13.0 12.4 11.3*  HCT 44.2 41.5 37.3  MCV 108.9* 106.4* 103.0*  PLT 172 145* [id number]   Basic Metabolic Panel: Recent Labs  Lab 03/01/20 1324 03/02/20 0313 03/03/20 0404  NA 141 143 138  K 4.7 4.5 5.3*  CL 88* 91* 90*  CO2 48* 40* 34*  GLUCOSE 113* 123* 144*  BUN 10 13 23   CREATININE 0.69 0.77 0.92  CALCIUM 9.6 9.7 9.0   GFR: Estimated Creatinine Clearance: 38 mL/min (by C-G formula based on SCr of 0.92 mg/dL). Liver Function Tests: No results for input(s): AST, ALT, ALKPHOS, BILITOT, PROT, ALBUMIN in the last 168 hours. No results for input(s): LIPASE, AMYLASE in the last 168 hours. No results for input(s): AMMONIA in the last 168 hours. Coagulation Profile: No results for input(s): INR, PROTIME in the last 168 hours. Cardiac Enzymes: No results for input(s): CKTOTAL, CKMB, CKMBINDEX, TROPONINI in the last 168 hours. BNP (last 3 results) No results for input(s): PROBNP in the last 8760 hours. HbA1C: No results for input(s): HGBA1C in the last 72 hours. CBG: Recent Labs  Lab 03/01/20 1855  GLUCAP 110*   Lipid Profile: No results for input(s): CHOL, HDL, LDLCALC, TRIG, CHOLHDL, LDLDIRECT in the last 72 hours. Thyroid Function Tests: No results for input(s): TSH, T4TOTAL, FREET4, T3FREE, THYROIDAB in the last 72 hours. Anemia Panel: No results for input(s): VITAMINB12, FOLATE, FERRITIN, TIBC, IRON, RETICCTPCT in the last 72 hours. Urine analysis:    Component Value Date/Time   COLORURINE YELLOW 01/29/2020 0532   APPEARANCEUR CLEAR 01/29/2020 0532   LABSPEC 1.025 01/29/2020 0532   PHURINE 5.0 01/29/2020 0532   GLUCOSEU NEGATIVE 01/29/2020 0532   GLUCOSEU NEGATIVE 09/20/2018 1426   HGBUR NEGATIVE 01/29/2020 0532    BILIRUBINUR NEGATIVE 01/29/2020 0532   BILIRUBINUR negative 08/11/2011 1358   KETONESUR 5 (A) 01/29/2020 0532   PROTEINUR 30 (A) 01/29/2020 0532   UROBILINOGEN 0.2 09/20/2018 1426   NITRITE NEGATIVE 01/29/2020 0532   LEUKOCYTESUR NEGATIVE 01/29/2020 0532   Sepsis Labs: @LABRCNTIP (procalcitonin:4,lacticidven:4)  ) Recent Results (from the past 240 hour(s))  Blood culture (routine x 2)     Status: None (Preliminary result)   Collection Time: 03/01/20 12:17 PM   Specimen: BLOOD  Result Value Ref Range Status   Specimen Description BLOOD RIGHT ANTECUBITAL  Final   Special Requests   Final    BOTTLES DRAWN AEROBIC AND ANAEROBIC Blood Culture results may not be optimal due to an inadequate volume of blood received in culture bottles   Culture   Final    NO GROWTH 2 DAYS Performed at [facility], [place] [address] [place], [place] [address]    Report Status PENDING  Incomplete  Blood culture (routine x 2)     Status: None (Preliminary result)   Collection Time: 03/01/20 12:17 PM   Specimen: BLOOD LEFT HAND  Result Value Ref Range Status  Specimen Description BLOOD LEFT HAND  Final   Special Requests   Final    AEROBIC BOTTLE ONLY Blood Culture results may not be optimal due to an inadequate volume of blood received in culture bottles   Culture   Final    NO GROWTH 2 DAYS Performed at [facility], [place] [address], [place], [place] [address]    Report Status PENDING  Incomplete  SARS Coronavirus 2 by RT PCR (hospital order, performed in [facility] hospital lab) Nasopharyngeal Nasopharyngeal Swab     Status: None   Collection Time: 03/01/20 12:17 PM   Specimen: Nasopharyngeal Swab  Result Value Ref Range Status   SARS Coronavirus 2 NEGATIVE NEGATIVE Final    Comment: (NOTE) SARS-CoV-2 target nucleic acids are NOT DETECTED. The SARS-CoV-2 RNA is generally detectable in upper and lower respiratory specimens during the acute phase of infection. The lowest concentration  of SARS-CoV-2 viral copies this assay can detect is 250 copies / mL. A negative result does not preclude SARS-CoV-2 infection and should not be used as the sole basis for treatment or other patient management decisions.  A negative result may occur with improper specimen collection / handling, submission of specimen other than nasopharyngeal swab, presence of viral mutation(s) within the areas targeted by this assay, and inadequate number of viral copies (<250 copies / mL). A negative result must be combined with clinical observations, patient history, and epidemiological information. Fact Sheet for Patients:   StrictlyIdeas.no Fact Sheet for Healthcare Providers: BankingDealers.co.za This test is not yet approved or cleared  by the [place] FDA and has been authorized for detection and/or diagnosis of SARS-CoV-2 by FDA under an Emergency Use Authorization (EUA).  This EUA will remain in effect (meaning this test can be used) for the duration of the COVID-19 declaration under Section 564(b)(1) of the Act, 21 U.S.C. section 360bbb-3(b)(1), unless the authorization is terminated or revoked sooner. Performed at [facility], [place] [address] [place],  [address]          Radiology Studies: DG Chest Port 1 View  Result Date: 03/01/2020 CLINICAL DATA:  Shortness of breath. EXAM: PORTABLE CHEST 1 VIEW COMPARISON:  January 29, 2020. FINDINGS: The heart size and mediastinal contours are within normal limits. No pneumothorax or pleural effusion is noted. Left lung is clear. Right upper lobe opacity is noted most likely representing scarring. The visualized skeletal structures are unremarkable. IMPRESSION: Right upper lobe opacity is noted most likely representing scarring. No other abnormality seen in the chest. Electronically Signed   By: Marijo Conception M.D.   On: 03/01/2020 12:55        Scheduled Meds: . arformoterol  15  mcg Nebulization BID  . aspirin EC  81 mg Oral Daily  . atorvastatin  10 mg Oral q1800  . azithromycin  250 mg Oral Daily  . budesonide (PULMICORT) nebulizer solution  0.5 mg Nebulization BID  . citalopram  20 mg Oral Daily  . enoxaparin (LOVENOX) injection  40 mg Subcutaneous Q24H  . furosemide  40 mg Oral BID  . ipratropium-albuterol  3 mL Nebulization BID  . levothyroxine  75 mcg Oral Daily  . methylPREDNISolone (SOLU-MEDROL) injection  40 mg Intravenous Q12H  . sodium chloride flush  3 mL Intravenous Q12H  . sodium zirconium cyclosilicate  5 g Oral BID   Continuous Infusions:    LOS: 2 days    Time spent: 33min  Domenic Polite, MD [facility] 03/03/2020, 11:18 AM

## 2020-03-04 LAB — BASIC METABOLIC PANEL
Anion gap: 14 (ref 5–15)
BUN: 18 mg/dL (ref 8–23)
CO2: 43 mmol/L — ABNORMAL HIGH (ref 22–32)
Calcium: 9.2 mg/dL (ref 8.9–10.3)
Chloride: 82 mmol/L — ABNORMAL LOW (ref 98–111)
Creatinine, Ser: 0.79 mg/dL (ref 0.44–1.00)
GFR calc Af Amer: >60 mL/min (ref >60)
GFR calc non Af Amer: >60 mL/min (ref >60)
Glucose, Bld: 135 mg/dL — ABNORMAL HIGH (ref 70–99)
Potassium: 3.6 mmol/L (ref 3.5–5.1)
Sodium: 139 mmol/L (ref 135–145)

## 2020-03-04 MED ORDER — ACETAMINOPHEN 325 MG PO TABS: 650.0000 mg | ORAL_TABLET | Freq: Four times a day (QID) | ORAL | Status: DC | PRN

## 2020-03-04 MED ORDER — PREDNISONE 50 MG PO TABS
50.0000 mg | ORAL_TABLET | Freq: Every day | ORAL | Status: DC
  Administered 2020-03-05: 50 mg via ORAL
  Filled 2020-03-04: qty 1

## 2020-03-04 MED ORDER — METHYLPREDNISOLONE SODIUM SUCC 40 MG IJ SOLR
40.0000 mg | Freq: Two times a day (BID) | INTRAMUSCULAR | Status: AC
  Administered 2020-03-04: 40 mg via INTRAVENOUS
  Filled 2020-03-04: qty 1

## 2020-03-04 NOTE — Progress Notes (Signed)
PROGRESS NOTE    [name]  [id number] DOB: 1946-01-13 DOA: 03/01/2020 PCP: Biagio Borg, MD  Brief Narrative: Jasmine Rupright Booneis a 74 y.o.femalewith medical history ofhypothyroidism s/p RAI; HTN; chronic respiratory failure, COPD on 3-4L home O2 and nightly BIPAP; and chronic diastolic CHF presenting withdyspnea. She was last admitted from 4/11- 15 for acute on chronic respiratory failure with hypercarbia; she was treated with BIPAP and breathing treatments and discharged with home health on [place] O2. -returned to ED obtunded and minimally responsive, ex-husband reported poor compliance with BiPAP ED Course:  Noncompliant with BIPAP, more confused and somnolent.  PCO2 >120, started on BIPAP.    Chest x-ray with scarring, no acute findings  Assessment & Plan:  Acute on chronic hypercarbic respiratory failure Severe COPD with exacerbation Noncompliance Frequent re hospitalizations -Supposed to be on 3 L home O2 and nightly BiPAP, ex-husband reported noncompliance with BiPAP which leads to hypercarbia and confusion followed by rehospitalization -Poor air movement, no wheezing at this time, chest x-ray without acute findings -Clinically improving after restarting BiPAP nightly, also on IV Solu-Medrol for COPD exacerbation, transition to prednisone taper -WE NEED PT to use her HOME TRILOGY/BIPAP MACHINE here TONIGHT -case management and home respiratory therapy eval requested to troubleshoot. -Ambulate, PT OT -Needs close pulmonary and palliative follow-up -Discharge planning  Mild hyperkalemia -Resolved, given Lokelma x2 yesterday  Chronic diastolic CHF -Continue ASA, oral Lasix, clinically appears euvolemic  HTN -Stable  Hyperglycemia, borderline diabetes -Recent A1c was 5.9 and so she does have borderline DM; recurrent steroid dosing likely does not help -CBG stable, continue sliding scale insulin for now  HLD -Continue Lipitor  Hypothyroidism -Recent TSH  suppressed at 0.181 - TSH dose does not appear to have been decreased -Continue Synthroid at current dose for now  Chronic pain -Continue tramadol and Celexa  Tobacco dependence -Reportedly quit smoking  Goals of care -Now DNR, frequent rehospitalizations and noncompliance with recurrent respiratory failure continues to be an ongoing issue  -Close palliative care and pulmonary follow-up needed -Eventually will need consideration of home hospice down the road  DVT prophylaxis:Lovenox  Code Status:DNR Family Communication:No family at bedside, admitting MD spoke with the patient'sex-husband  Status is: Inpatient  Remains inpatient appropriate because: severity of illness recurrent hypercarbic respiratory failure, COPD exacerbation   Dispo: The patient is from: Home              Anticipated d/c is to: Home              Anticipated d/c date is: 1 days              Patient currently is not medically stable to d/c.    Consultants:   Pulm   Procedures:   Antimicrobials:    Subjective: -Feels better, breathing improving, unfortunately used hospital BiPAP last night  Objective: Vitals:   03/04/20 0032 03/04/20 0500 03/04/20 0743 03/04/20 0840  BP: 106/70 130/75  122/62  Pulse: 62   77  Resp: 15 19  15   Temp: 98.1 F (36.7 C) 97.8 F (36.6 C)  98.4 F (36.9 C)  TempSrc: Axillary Oral  Oral  SpO2: 94%  100%   Weight:      Height:       No intake or output data in the 24 hours ending 03/04/20 1105 Filed Weights   03/01/20 1900  Weight: 53.3 kg    Examination:  General, elderly chronically ill female, laying in bed, AAOx3, no distress HEENT no JVD  Lungs with improving air movement bilaterally, no wheezes CVS S1-S2 regular rate rhythm Abdomen is soft nontender bowel sounds present Extremities with no edema Skin: No rashes, lesions or ulcers Psychiatry: Poor insight and judgment   Data Reviewed:   CBC: Recent Labs  Lab 03/01/20 1324  03/02/20 0313 03/03/20 0404  WBC 5.1 3.8* 7.2  NEUTROABS 3.5  --   --   HGB 13.0 12.4 11.3*  HCT 44.2 41.5 37.3  MCV 108.9* 106.4* 103.0*  PLT 172 145* [id number]   Basic Metabolic Panel: Recent Labs  Lab 03/01/20 1324 03/02/20 0313 03/03/20 0404 03/04/20 0407  NA 141 143 138 139  K 4.7 4.5 5.3* 3.6  CL 88* 91* 90* 82*  CO2 48* 40* 34* 43*  GLUCOSE 113* 123* 144* 135*  BUN 10 13 23 18   CREATININE 0.69 0.77 0.92 0.79  CALCIUM 9.6 9.7 9.0 9.2   GFR: Estimated Creatinine Clearance: 43.6 mL/min (by C-G formula based on SCr of 0.79 mg/dL). Liver Function Tests: No results for input(s): AST, ALT, ALKPHOS, BILITOT, PROT, ALBUMIN in the last 168 hours. No results for input(s): LIPASE, AMYLASE in the last 168 hours. No results for input(s): AMMONIA in the last 168 hours. Coagulation Profile: No results for input(s): INR, PROTIME in the last 168 hours. Cardiac Enzymes: No results for input(s): CKTOTAL, CKMB, CKMBINDEX, TROPONINI in the last 168 hours. BNP (last 3 results) No results for input(s): PROBNP in the last 8760 hours. HbA1C: No results for input(s): HGBA1C in the last 72 hours. CBG: Recent Labs  Lab 03/01/20 1855  GLUCAP 110*   Lipid Profile: No results for input(s): CHOL, HDL, LDLCALC, TRIG, CHOLHDL, LDLDIRECT in the last 72 hours. Thyroid Function Tests: No results for input(s): TSH, T4TOTAL, FREET4, T3FREE, THYROIDAB in the last 72 hours. Anemia Panel: No results for input(s): VITAMINB12, FOLATE, FERRITIN, TIBC, IRON, RETICCTPCT in the last 72 hours. Urine analysis:    Component Value Date/Time   COLORURINE YELLOW 01/29/2020 0532   APPEARANCEUR CLEAR 01/29/2020 0532   LABSPEC 1.025 01/29/2020 0532   PHURINE 5.0 01/29/2020 0532   GLUCOSEU NEGATIVE 01/29/2020 0532   GLUCOSEU NEGATIVE 09/20/2018 1426   HGBUR NEGATIVE 01/29/2020 0532   BILIRUBINUR NEGATIVE 01/29/2020 0532   BILIRUBINUR negative 08/11/2011 1358   KETONESUR 5 (A) 01/29/2020 0532   PROTEINUR 30  (A) 01/29/2020 0532   UROBILINOGEN 0.2 09/20/2018 1426   NITRITE NEGATIVE 01/29/2020 0532   LEUKOCYTESUR NEGATIVE 01/29/2020 0532   Sepsis Labs: @LABRCNTIP (procalcitonin:4,lacticidven:4)  ) Recent Results (from the past 240 hour(s))  Blood culture (routine x 2)     Status: None (Preliminary result)   Collection Time: 03/01/20 12:17 PM   Specimen: BLOOD  Result Value Ref Range Status   Specimen Description BLOOD RIGHT ANTECUBITAL  Final   Special Requests   Final    BOTTLES DRAWN AEROBIC AND ANAEROBIC Blood Culture results may not be optimal due to an inadequate volume of blood received in culture bottles   Culture   Final    NO GROWTH 3 DAYS Performed at [facility], [place] [address], [place], [place] [address]    Report Status PENDING  Incomplete  Blood culture (routine x 2)     Status: None (Preliminary result)   Collection Time: 03/01/20 12:17 PM   Specimen: BLOOD LEFT HAND  Result Value Ref Range Status   Specimen Description BLOOD LEFT HAND  Final   Special Requests   Final    AEROBIC BOTTLE ONLY Blood Culture results may not  be optimal due to an inadequate volume of blood received in culture bottles   Culture   Final    NO GROWTH 3 DAYS Performed at [facility], [place] [address] [place], [place] [address]    Report Status PENDING  Incomplete  SARS Coronavirus 2 by RT PCR (hospital order, performed in [facility] hospital lab) Nasopharyngeal Nasopharyngeal Swab     Status: None   Collection Time: 03/01/20 12:17 PM   Specimen: Nasopharyngeal Swab  Result Value Ref Range Status   SARS Coronavirus 2 NEGATIVE NEGATIVE Final    Comment: (NOTE) SARS-CoV-2 target nucleic acids are NOT DETECTED. The SARS-CoV-2 RNA is generally detectable in upper and lower respiratory specimens during the acute phase of infection. The lowest concentration of SARS-CoV-2 viral copies this assay can detect is 250 copies / mL. A negative result does not preclude SARS-CoV-2  infection and should not be used as the sole basis for treatment or other patient management decisions.  A negative result may occur with improper specimen collection / handling, submission of specimen other than nasopharyngeal swab, presence of viral mutation(s) within the areas targeted by this assay, and inadequate number of viral copies (<250 copies / mL). A negative result must be combined with clinical observations, patient history, and epidemiological information. Fact Sheet for Patients:   StrictlyIdeas.no Fact Sheet for Healthcare Providers: BankingDealers.co.za This test is not yet approved or cleared  by the [place] FDA and has been authorized for detection and/or diagnosis of SARS-CoV-2 by FDA under an Emergency Use Authorization (EUA).  This EUA will remain in effect (meaning this test can be used) for the duration of the COVID-19 declaration under Section 564(b)(1) of the Act, 21 U.S.C. section 360bbb-3(b)(1), unless the authorization is terminated or revoked sooner. Performed at [facility], [place] [address], [place], [place] [address]          Radiology Studies: No results found.      Scheduled Meds: . arformoterol  15 mcg Nebulization BID  . aspirin EC  81 mg Oral Daily  . atorvastatin  10 mg Oral q1800  . azithromycin  250 mg Oral Daily  . budesonide (PULMICORT) nebulizer solution  0.5 mg Nebulization BID  . citalopram  20 mg Oral Daily  . enoxaparin (LOVENOX) injection  40 mg Subcutaneous Q24H  . furosemide  40 mg Oral BID  . ipratropium-albuterol  3 mL Nebulization BID  . levothyroxine  75 mcg Oral Daily  . methylPREDNISolone (SOLU-MEDROL) injection  40 mg Intravenous Q12H  . sodium chloride flush  3 mL Intravenous Q12H   Continuous Infusions:    LOS: 3 days    Time spent: 33min  Domenic Polite, MD [facility] 03/04/2020, 11:05 AM

## 2020-03-04 NOTE — Plan of Care (Signed)

## 2020-03-04 NOTE — Procedures (Signed)
Patient is resting on home BIPAP tonight, tolerating well.

## 2020-03-05 MED ORDER — PREDNISONE 20 MG PO TABS: 20.0000 mg | ORAL_TABLET | Freq: Every day | ORAL | 0 refills | Status: DC

## 2020-03-05 NOTE — Evaluation (Signed)
Physical Therapy Evaluation and Discharge Patient Details Name: Jasmine [name] MRN: [id number] DOB: [birth year] Today's Date: 03/05/2020   History of Present Illness  74 y.o. female with medical history significant of hypothyroidism s/p RAI; HTN; COPD on 3-4L home O2 and nightly BIPAP; and chronic diastolic CHF presenting with dyspnea, obtunded. She had not been using her BiPAP at home.   Clinical Impression   Patient evaluated by Physical Therapy. Patient has been receiving HHPT, OT, RN (per pt) and with PT has been working on walking outside her home. She was able to describe her O2 use at rest vs with activity (3L vs 4L). Recommend she resume HHPT on return home to continue to work on strengthening, energy conservation and gait training.  PT is signing off as patient is being discharged home per MD. Thank you for this referral.     Follow Up Recommendations Home health PT;Supervision - Intermittent    Equipment Recommendations  None recommended by PT(pt has been working with agencies to get grab bar in tub)    Recommendations for Other Services       Precautions / Restrictions Precautions Precautions: Other (comment) Precaution Comments: home O2 3-4L (depending on activity level) Restrictions Weight Bearing Restrictions: No      Mobility  Bed Mobility Overal bed mobility: Modified Independent             General bed mobility comments: using HOB elevated as she does at home  Transfers Overall transfer level: Modified independent Equipment used: Rolling walker (2 wheeled)             General transfer comment: proper sequencing with RW with armrests and without  Ambulation/Gait Ambulation/Gait assistance: Supervision Gait Distance (Feet): 160 Feet Assistive device: Rolling walker (2 wheeled) Gait Pattern/deviations: Step-through pattern;Decreased stride length Gait velocity: vc to slow down   General Gait Details: very light support on handles of RW; vc to  limit talking to keep sats elevated  Stairs            Wheelchair Mobility    Modified Rankin (Stroke Patients Only)       Balance Overall balance assessment: Modified Independent                                           Pertinent Vitals/Pain Pain Assessment: No/denies pain    Home Living Family/patient expects to be discharged to:: Private residence Living Arrangements: Other (Comment)(ex-husband) Available Help at Discharge: Family;Available 24 hours/day Type of Home: House Home Access: Ramped entrance     Home Layout: One level Home Equipment: Walker - 4 wheels;Walker - 2 wheels;Grab bars - tub/shower;Wheelchair - manual(grab bar over the edge of the tub; ) Additional Comments: 3L home O2    Prior Function Level of Independence: Independent with assistive device(s)         Comments: does tub bath; walks no device inside, rollator outside; was getting HHPT, OT, RN PTA     Hand Dominance   Dominant Hand: Right    Extremity/Trunk Assessment   Upper Extremity Assessment Upper Extremity Assessment: Generalized weakness    Lower Extremity Assessment Lower Extremity Assessment: Overall WFL for tasks assessed    Cervical / Trunk Assessment Cervical / Trunk Assessment: Other exceptions Cervical / Trunk Exceptions: cachectic  Communication   Communication: HOH  Cognition Arousal/Alertness: Awake/alert Behavior During Therapy: WFL for tasks assessed/performed Overall Cognitive Status: Within  Functional Limits for tasks assessed                                        General Comments General comments (skin integrity, edema, etc.): initially on 3L to ambulate (she uses 3L indoors and 4L when walking outside at home); sats decr 88% on 3L (with face mask), incr to 4L with standing rest break with sats up to 93%    Exercises     Assessment/Plan    PT Assessment All further PT needs can be met in the next venue of care   PT Problem List Decreased strength;Decreased activity tolerance;Decreased mobility;Decreased knowledge of precautions;Cardiopulmonary status limiting activity       PT Treatment Interventions      PT Goals (Current goals can be found in the Care Plan section)  Acute Rehab PT Goals Patient Stated Goal: go home today; know which meds she is to take and make sure her machine is working PT Goal Formulation: All assessment and education complete, DC therapy    Frequency     Barriers to discharge        Co-evaluation               AM-PAC PT "6 Clicks" Mobility  Outcome Measure Help needed turning from your back to your side while in a flat bed without using bedrails?: None Help needed moving from lying on your back to sitting on the side of a flat bed without using bedrails?: None Help needed moving to and from a bed to a chair (including a wheelchair)?: None Help needed standing up from a chair using your arms (e.g., wheelchair or bedside chair)?: None Help needed to walk in hospital room?: A Little Help needed climbing 3-5 steps with a railing? : A Little 6 Click Score: 22    End of Session Equipment Utilized During Treatment: Gait belt;Oxygen Activity Tolerance: Treatment limited secondary to medical complications (Comment)(decr sats require rest periods) Patient left: in bed;with call bell/phone within reach;with bed alarm set Nurse Communication: Mobility status;Other (comment)(HHPT needed) PT Visit Diagnosis: Difficulty in walking, not elsewhere classified (R26.2)    Time: 9798-9211 PT Time Calculation (min) (ACUTE ONLY): 38 min   Charges:   PT Evaluation $PT Eval Low Complexity: 1 Low PT Treatments $Gait Training: 8-22 mins $Therapeutic Activity: 8-22 mins         Arby Barrette, PT Pager [phone]   Rexanne Mano 03/05/2020, 2:14 PM

## 2020-03-05 NOTE — Progress Notes (Signed)
PT Cancellation Note  Patient Details Name: Jasmine [name] MRN: [id number] DOB: 12-20-1945   Cancelled Treatment:    Reason Eval/Treat Not Completed: Patient at procedure or test/unavailable  Patient beginning to eat her lunch. Unable to persuade her to get OOB first. Will return ASAP to complete evaluation.   Arby Barrette, PT Pager [phone]  Rexanne Mano 03/05/2020, 12:22 PM

## 2020-03-05 NOTE — Progress Notes (Signed)
Discharge instructions reviewed with patient and family member. Both verbalized understanding. All personal belongings collected from the room by nurse an placed in personal belonging bags. IV removed without complication. Patient discharged via wheelchair to personal vehicle driven by family member.

## 2020-03-05 NOTE — Plan of Care (Signed)
  Problem: Clinical Measurements: Goal: Will remain free from infection Outcome: Progressing Goal: Respiratory complications will improve Outcome: Progressing   Problem: Activity: Goal: Risk for activity intolerance will decrease Outcome: Progressing   Problem: Coping: Goal: Level of anxiety will decrease Outcome: Progressing   

## 2020-03-05 NOTE — Discharge Summary (Signed)
Physician Discharge Summary  Jasmine [name] [id number] DOB: 10/09/1946 DOA: 03/01/2020  PCP: Biagio Borg, MD  Admit date: 03/01/2020 Discharge date: 03/05/2020  Time spent: 35 minutes  Recommendations for Outpatient Follow-up:  1. PCP [name] in 1 week 2. Pulmonary Dr. Elsworth Soho in 2 weeks 3. Home health PT RN, respiratory 4. Palliative care follow-up in 1 month   Discharge Diagnoses:  Acute on chronic hypoxic and hypercarbic respiratory failure Severe COPD on 3 L home O2 DO NOT RESUSCITATE   Essential hypertension   Hypothyroidism following radioiodine therapy   Tobacco abuse disorder   Chronic diastolic congestive heart failure (HCC)   Hyperglycemia   Acute on chronic respiratory failure with hypoxia and hypercapnia (HCC)   COPD with acute exacerbation (HCC)   Chronic low back pain   Acute on chronic respiratory failure with hypercapnia ([place])   Discharge Condition: stable  Diet recommendation: Low-sodium, heart healthy  Filed Weights   03/01/20 1900  Weight: 53.3 kg    History of present illness:  Jasmine Mizzi Booneis a 74 y.o.femalewith medical history ofhypothyroidism s/p RAI; HTN; chronic respiratory failure, COPD on 3-4L home O2 and nightly BIPAP; and chronic diastolic CHF presenting withdyspnea. She was last admitted from4/11- 17for acute on chronic respiratory failure with hypercarbia; she was treated with BIPAP and breathing treatments and discharged with home health on [place] O2. -returned to ED obtunded and minimally responsive, ex-husband reported poor compliance with BiPAP ED Course:Noncompliant with BIPAP, more confused and somnolent. PCO2 >120, started on BIPAP.   Chest x-ray with scarring, no acute findings   Hospital Course:   Acute on chronic hypercarbic respiratory failure Severe COPD with exacerbation Noncompliance Frequent re hospitalizations -Supposed to be on 3 L home O2 and nightly BiPAP, ex-husband reported noncompliance with BiPAP  which leads to hypercarbia and confusion followed by rehospitalization -Poor air movement, no wheezing at this time, chest x-ray without acute findings -Clinically improving after restarting BiPAP nightly, also on IV Solu-Medrol for COPD exacerbation, transitioned to prednisone taper -Patient spouse brought her home BiPAP and she was able to use this overnight without any issues, reports having some problems with oxygen tubing and other supplies, home health notified  -Home health including home respiratory therapy eval set up as well troubleshoot. -Discharged home in a stable condition on home O2, counseled regarding importance of compliance with BiPAP -Follow-up with pulmonary Dr. Elsworth Soho, PCP and palliative team  Chronic diastolic CHF -Continue ASA, oral Lasix, clinically appears euvolemic  HTN -Stable  Hyperglycemia, borderline diabetes -Recent A1c was 5.9 and so she does have borderline DM -CBG stable  HLD -Continue Lipitor  Hypothyroidism -Recent TSH suppressed at 0.181 - TSH dose does not appear to have been decreased -Continue Synthroid at current dose for now  Chronic pain -Continue tramadol and Celexa  Tobacco dependence -Reportedly quit smoking  Goals of care -Now DNR, frequent rehospitalizations and noncompliance with BiPAP and recurrent respiratory failure continues to be an ongoing issue  -Close palliative care and pulmonary follow-up needed -Eventually will need consideration of home hospice down the road   Discharge Exam: Vitals:   03/05/20 0903 03/05/20 1131  BP:  (!) 127/94  Pulse: 82 77  Resp: 12 17  Temp:  97.9 F (36.6 C)  SpO2: 97% 97%    General: AAOx3 Cardiovascular: S1S2/RRR Respiratory: Improved air movement, no wheezes  Discharge Instructions   Discharge Instructions    Diet - low sodium heart healthy   Complete by: As directed    Increase  activity slowly   Complete by: As directed      Allergies as of 03/05/2020       Reactions   Alendronate Sodium Other (See Comments)   Patient does not remember this reaction      Medication List    TAKE these medications   albuterol 108 (90 Base) MCG/ACT inhaler Commonly known as: VENTOLIN HFA INHALE 1 TO 2 PUFFS INTO THE LUNGS EVERY 6 (SIX) HOURS AS NEEDED FOR WHEEZING OR SHORTNESS OF BREATH. What changed:   how much to take  how to take this  when to take this  reasons to take this  additional instructions   albuterol (2.5 MG/3ML) 0.083% nebulizer solution Commonly known as: PROVENTIL Take 3 mLs (2.5 mg total) by nebulization every 6 (six) hours as needed for wheezing or shortness of breath. What changed: Another medication with the same name was changed. Make sure you understand how and when to take each.   aspirin 81 MG EC tablet Take 1 tablet (81 mg total) by mouth daily.   atorvastatin 10 MG tablet Commonly known as: LIPITOR TAKE ONE TABLET BY MOUTH IN THE EVENING What changed:   how much to take  how to take this  when to take this   citalopram 20 MG tablet Commonly known as: CELEXA NEW PRESCRIPTION REQUEST: CITALOPRAM 20MG  - TAKE ONE TABLET BY MOUTH DAILY What changed:   how much to take  how to take this  when to take this  additional instructions   fluticasone furoate-vilanterol 200-25 MCG/INH Aepb Commonly known as: BREO ELLIPTA Inhale 1 puff into the lungs daily.   furosemide 40 MG tablet Commonly known as: LASIX NEW PRESCRIPTION REQUEST: FUROSEMIDE 40MG  - TAKE ONE TABLET BY MOUTH IN THE MORNING AND TAKE ONE TABLET BY MOUTH AS NEEDED IN THE EVENING What changed:   how much to take  how to take this  when to take this  additional instructions   levothyroxine 75 MCG tablet Commonly known as: SYNTHROID NEW PRESCRIPTION REQUEST: LEVOTHYROXINE 75MCG - TAKE ONE TABLET BY MOUTH EARLY MORNING What changed:   how much to take  how to take this  when to take this  additional instructions   OXYGEN Inhale 3 L  into the lungs. 24/7   potassium chloride 10 MEQ tablet Commonly known as: KLOR-CON NEW PRESCRIPTION REQUEST: KLOR-CON 10MEQ - TAKE ONE TABLET BY MOUTH TWICE DAILY What changed:   how much to take  how to take this  when to take this  additional instructions   predniSONE 20 MG tablet Commonly known as: DELTASONE Take 1-2 tablets (20-40 mg total) by mouth daily with breakfast. Take 40 mg daily for 2 days and then 20 mg daily for 2 days and then stop What changed:   medication strength  how much to take  when to take this  additional instructions   Spiriva HandiHaler 18 MCG inhalation capsule Generic drug: tiotropium NEW PRESCRIPTION REQUEST: SPIRIVA 18MCG HANDIHALER - TAKE TWO INHALATIONS OF THE CONTENTS OF ONE CAPSULE BY ONCE DAILY   traMADol 50 MG tablet Commonly known as: ULTRAM Take 1 tablet (50 mg total) by mouth 2 (two) times daily as needed. What changed: when to take this      Allergies  Allergen Reactions  . Alendronate Sodium Other (See Comments)    Patient does not remember this reaction   Follow-up Information    Biagio Borg, MD. Schedule an appointment as soon as possible for a visit in 1 week(s).  Specialties: Internal Medicine, Radiology Contact information: [place] [address] [phone]        Rigoberto Noel, MD. Schedule an appointment as soon as possible for a visit in 2 week(s).   Specialty: Pulmonary Disease Contact information: [place] [address] [phone]            The results of significant diagnostics from this hospitalization (including imaging, microbiology, ancillary and laboratory) are listed below for reference.    Significant Diagnostic Studies: DG Chest Port 1 View  Result Date: 03/01/2020 CLINICAL DATA:  Shortness of breath. EXAM: PORTABLE CHEST 1 VIEW COMPARISON:  January 29, 2020. FINDINGS: The heart size and mediastinal contours are within normal limits.  No pneumothorax or pleural effusion is noted. Left lung is clear. Right upper lobe opacity is noted most likely representing scarring. The visualized skeletal structures are unremarkable. IMPRESSION: Right upper lobe opacity is noted most likely representing scarring. No other abnormality seen in the chest. Electronically Signed   By: Marijo Conception M.D.   On: 03/01/2020 12:55    Microbiology: Recent Results (from the past 240 hour(s))  Blood culture (routine x 2)     Status: None (Preliminary result)   Collection Time: 03/01/20 12:17 PM   Specimen: BLOOD  Result Value Ref Range Status   Specimen Description BLOOD RIGHT ANTECUBITAL  Final   Special Requests   Final    BOTTLES DRAWN AEROBIC AND ANAEROBIC Blood Culture results may not be optimal due to an inadequate volume of blood received in culture bottles   Culture   Final    NO GROWTH 4 DAYS Performed at [facility], [place] [address], [place], [place] [address]    Report Status PENDING  Incomplete  Blood culture (routine x 2)     Status: None (Preliminary result)   Collection Time: 03/01/20 12:17 PM   Specimen: BLOOD LEFT HAND  Result Value Ref Range Status   Specimen Description BLOOD LEFT HAND  Final   Special Requests   Final    AEROBIC BOTTLE ONLY Blood Culture results may not be optimal due to an inadequate volume of blood received in culture bottles   Culture   Final    NO GROWTH 4 DAYS Performed at [facility], [place] [address], [place], [place] [address]    Report Status PENDING  Incomplete  SARS Coronavirus 2 by RT PCR (hospital order, performed in [facility] hospital lab) Nasopharyngeal Nasopharyngeal Swab     Status: None   Collection Time: 03/01/20 12:17 PM   Specimen: Nasopharyngeal Swab  Result Value Ref Range Status   SARS Coronavirus 2 NEGATIVE NEGATIVE Final    Comment: (NOTE) SARS-CoV-2 target nucleic acids are NOT DETECTED. The SARS-CoV-2 RNA is generally detectable in upper and lower respiratory  specimens during the acute phase of infection. The lowest concentration of SARS-CoV-2 viral copies this assay can detect is 250 copies / mL. A negative result does not preclude SARS-CoV-2 infection and should not be used as the sole basis for treatment or other patient management decisions.  A negative result may occur with improper specimen collection / handling, submission of specimen other than nasopharyngeal swab, presence of viral mutation(s) within the areas targeted by this assay, and inadequate number of viral copies (<250 copies / mL). A negative result must be combined with clinical observations, patient history, and epidemiological information. Fact Sheet for Patients:   StrictlyIdeas.no Fact Sheet for Healthcare Providers:  BankingDealers.co.za This test is not yet approved or cleared  by the [place] and has been authorized for detection and/or diagnosis of SARS-CoV-2 by FDA under an Emergency Use Authorization (EUA).  This EUA will remain in effect (meaning this test can be used) for the duration of the COVID-19 declaration under Section 564(b)(1) of the Act, 21 U.S.C. section 360bbb-3(b)(1), unless the authorization is terminated or revoked sooner. Performed at [facility], [place] [address] [place],  [address]      Labs: Basic Metabolic Panel: Recent Labs  Lab 03/01/20 1324 03/02/20 0313 03/03/20 0404 03/04/20 0407  NA 141 143 138 139  K 4.7 4.5 5.3* 3.6  CL 88* 91* 90* 82*  CO2 48* 40* 34* 43*  GLUCOSE 113* 123* 144* 135*  BUN 10 13 23 18   CREATININE 0.69 0.77 0.92 0.79  CALCIUM 9.6 9.7 9.0 9.2   Liver Function Tests: No results for input(s): AST, ALT, ALKPHOS, BILITOT, PROT, ALBUMIN in the last 168 hours. No results for input(s): LIPASE, AMYLASE in the last 168 hours. No results for input(s): AMMONIA in the last 168 hours. CBC: Recent Labs  Lab 03/01/20 1324 03/02/20 0313  03/03/20 0404  WBC 5.1 3.8* 7.2  NEUTROABS 3.5  --   --   HGB 13.0 12.4 11.3*  HCT 44.2 41.5 37.3  MCV 108.9* 106.4* 103.0*  PLT 172 145* 151   Cardiac Enzymes: No results for input(s): CKTOTAL, CKMB, CKMBINDEX, TROPONINI in the last 168 hours. BNP: BNP (last 3 results) Recent Labs    03/06/19 2322 08/14/19 1354 03/01/20 1324  BNP 37.2 30.8 36.6    ProBNP (last 3 results) No results for input(s): PROBNP in the last 8760 hours.  CBG: Recent Labs  Lab 03/01/20 1855  GLUCAP 110*       Signed:  Domenic Polite MD.  [facility] 03/05/2020, 12:18 PM

## 2020-03-05 NOTE — TOC Transition Note (Addendum)
Transition of Care [facility]) - CM/SW Discharge Note   Patient Details  Name: Jasmine [name] MRN: [id number] Date of Birth: [birth year]  Transition of Care [facility]) CM/SW Contact:  Pollie Friar, RN Phone Number: 03/05/2020, 1:29 PM   Clinical Narrative:    Pt discharging home with resumption of [place] services through [place]. Cindy with [facility] aware of d/c and resumption orders.  [employer] to check trilogy machine prior to d/c. They also can fix the tubing issues that patient states she is having with the machine.  Pt has transportation home once machine is check out.   1500: [employer] can to see pts trilogy machine and state the machine is functioning properly. They will sent out new oxygen tubing this afternoon for the trilogy machine with the needed adapters.   Final next level of care: Home w Home Health Services Barriers to Discharge: No Barriers Identified   Patient Goals and CMS Choice   CMS Medicare.gov Compare Post Acute Care list provided to:: Patient Choice offered to / list presented to : Patient  Discharge Placement                       Discharge Plan and Services   Discharge Planning Services: CM Consult                      HH Arranged: RN, PT, Respirator Therapy HH Agency: [place] Date [facility] Agency Contacted: 03/05/20   Representative spoke with at [place]: [place] ([place]) Interventions     Readmission Risk Interventions Readmission Risk Prevention Plan 03/02/2020 12/07/2019 08/17/2019  Transportation Screening Complete Complete Complete  PCP or Specialist Appt within 3-5 Days - Complete -  HRI or Home Care Consult Complete Complete -  Social Work Consult for [place] Planning/Counseling - Complete -  Palliative Care Screening Not Applicable Not Applicable -  Medication Review Press photographer) Referral to Pharmacy Complete -  PCP or Specialist appointment within 3-5 days of discharge - - -  [place] or [place] recent data might be hidden

## 2020-03-06 ENCOUNTER — Other Ambulatory Visit: Payer: Self-pay

## 2020-03-06 ENCOUNTER — Telehealth: Payer: Self-pay

## 2020-03-06 LAB — CULTURE, BLOOD (ROUTINE X 2)
Culture: NO GROWTH
Culture: NO GROWTH

## 2020-03-06 NOTE — Patient Outreach (Signed)
[facility]) Care Management  [place]  03/06/2020   Jasmine [name] 1946-03-29 [id number]  Subjective: Telephone call to patient for follow up hospitalization. Patient reports that she feels good and glad she pulled through hospitalization.  Discussed with patient at length her COPD and wearing her BIPAP as ordered.  Stressed to patient when she lays down at night to just go ahead and put BIPAP on as she frequently lays down to watch TV and falls asleep without the BIPAP on.  Patient has all her medications and will be restarting home health.  Patient also states she has a call into Dr. Gwynn Burly office and waiting for return call and will set up follow up with him.  She also states she will be calling Dr. Elsworth Soho to set up follow up as well.    Objective:   Encounter Medications:  Outpatient Encounter Medications as of 03/06/2020  Medication Sig  . albuterol (PROVENTIL) (2.5 MG/3ML) 0.083% nebulizer solution Take 3 mLs (2.5 mg total) by nebulization every 6 (six) hours as needed for wheezing or shortness of breath.  [name] albuterol (VENTOLIN HFA) 108 (90 Base) MCG/ACT inhaler INHALE 1 TO 2 PUFFS INTO THE LUNGS EVERY 6 (SIX) HOURS AS NEEDED FOR WHEEZING OR SHORTNESS OF BREATH. (Patient taking differently: Inhale 2 puffs into the lungs every 6 (six) hours as needed for shortness of breath. )  . aspirin EC 81 MG EC tablet Take 1 tablet (81 mg total) by mouth daily.  [name] atorvastatin (LIPITOR) 10 MG tablet TAKE ONE TABLET BY MOUTH IN THE EVENING (Patient taking differently: Take 10 mg by mouth daily at 6 PM. TAKE ONE TABLET BY MOUTH IN THE EVENING)  . citalopram (CELEXA) 20 MG tablet NEW PRESCRIPTION REQUEST: CITALOPRAM 20MG  - TAKE ONE TABLET BY MOUTH DAILY (Patient taking differently: Take 20 mg by mouth daily. )  . fluticasone furoate-vilanterol (BREO ELLIPTA) 200-25 MCG/INH AEPB Inhale 1 puff into the lungs daily.  . furosemide (LASIX) 40 MG tablet NEW PRESCRIPTION REQUEST:  FUROSEMIDE 40MG  - TAKE ONE TABLET BY MOUTH IN THE MORNING AND TAKE ONE TABLET BY MOUTH AS NEEDED IN THE EVENING (Patient taking differently: Take 40 mg by mouth 2 (two) times daily. )  . levothyroxine (SYNTHROID) 75 MCG tablet NEW PRESCRIPTION REQUEST: LEVOTHYROXINE 75MCG - TAKE ONE TABLET BY MOUTH EARLY MORNING (Patient taking differently: Take 75 mcg by mouth daily. )  . OXYGEN Inhale 3 L into the lungs. 24/7  . potassium chloride (KLOR-CON) 10 MEQ tablet NEW PRESCRIPTION REQUEST: KLOR-CON 10MEQ - TAKE ONE TABLET BY MOUTH TWICE DAILY  . predniSONE (DELTASONE) 20 MG tablet Take 1-2 tablets (20-40 mg total) by mouth daily with breakfast. Take 40 mg daily for 2 days and then 20 mg daily for 2 days and then stop  . tiotropium (SPIRIVA HANDIHALER) 18 MCG inhalation capsule NEW PRESCRIPTION REQUEST: SPIRIVA 18MCG HANDIHALER - TAKE TWO INHALATIONS OF THE CONTENTS OF ONE CAPSULE BY ONCE DAILY  . traMADol (ULTRAM) 50 MG tablet Take 1 tablet (50 mg total) by mouth 2 (two) times daily as needed. (Patient taking differently: Take 50 mg by mouth 2 (two) times daily. )   No facility-administered encounter medications on file as of 03/06/2020.    Functional Status:  In your present state of health, do you have any difficulty performing the following activities: 02/07/2020 02/06/2020  Hearing? N N  Vision? N N  Difficulty concentrating or making decisions? N N  Comment - -  Walking or climbing stairs?  N N  Comment - -  Dressing or bathing? N N  Doing errands, shopping? N N  Preparing Food and eating ? N N  Using the Toilet? N N  In the past six months, have you accidently leaked urine? Y Y  Comment occasional incontinence wears a pantyliner  Do you have problems with loss of bowel control? N N  Managing your Medications? N N  Managing your Finances? N N  Housekeeping or managing your Housekeeping? Y Y  Comment ex-spouse assists needs assistance  Some recent data might be hidden    Fall/Depression  Screening: Fall Risk  02/07/2020 02/06/2020 12/14/2019  Falls in the past year? 0 0 0  Number falls in past yr: - 0 -  Comment - - -  Injury with Fall? - 0 -  Risk Factor Category  - - -  Risk for fall due to : - Impaired balance/gait -  Follow up - Falls evaluation completed;Education provided;Falls prevention discussed -   PHQ 2/9 Scores 02/07/2020 02/06/2020 12/14/2019 12/13/2019 11/30/2019 08/22/2019 07/29/2019  PHQ - 2 Score 0 0 0 0 0 0 0  PHQ- 9 Score - - - - - - -    Assessment: Patient with recent hospitalization related to her COPD.    Plan: [facility] CM Care Plan Problem One     Most Recent Value  Care Plan Problem One  Knowledge deficit related to COPD  Role Documenting the Problem One  Care Management Telephonic Coordinator  Care Plan for Problem One  Active  [facility] Long Term Goal   Patient will continue to follow COPD regimen and not have any excerbations within the next 90 days.  [facility] Long Term Goal Start Date  02/07/20  Interventions for Problem One Long Term Goal  Patient with recent hospitalization. Patient has been non-compliant with BIPAP causing hospitalizations.  Discussed with patient importance of wearing BIPAP and how not wearing causes respiratory failure and hospitalizations.     [facility] CM Care Plan Problem Two     Most Recent Value  Care Plan Problem [facility] Admission  Role Documenting the Problem Two  Care Management [place] for Problem Two  Active  Interventions for Problem Two Long Term Goal   Patient discharge on 03-05-20 after admission for respiratory failure. Patient not wearing BIPAP as ordered.  Discussed the importance of BIPAP and avoiding COPD exacerbations and hospitalizations.    [facility] Long Term Goal  Patient will not experience unplanned hospital readmission within the next 31 days  [facility] Long Term Goal Start Date  03/06/20  [facility] CM Short Term Goal #1   Patient will set up follow up with pulmonologist within the next two weeks.     [facility] CM Short Term Goal #1 Start Date  03/06/20  Interventions for Short Term Goal #2   Patient to call and set up follow up with Dr. Elsworth Soho.    [facility] CM Short Term Goal #2   Patient will report wearing bipap nightly within 14 days.    [facility] CM Short Term Goal #2 Start Date  03/06/20  Interventions for Short Term Goal #2  Stressed to patient importance of wearing BIPAP fully at night and consequences of not wearing BIPAP.       RN CM will contact patient next week and patient agreeable.    [name], RN, MSN [place] Management Care Management Coordinator Direct Line [phone] Cell [phone] Toll Free: [phone]  Fax: [phone]

## 2020-03-06 NOTE — Telephone Encounter (Signed)
Called pt there was no answer LMOM RTC need to make hosp f/u appt.Marland KitchenJohny [name]

## 2020-03-06 NOTE — Telephone Encounter (Signed)
New message    The patient calling recent discharge from the hospital and have questions regarding shingles shot and recent medication

## 2020-03-08 ENCOUNTER — Ambulatory Visit: Payer: Self-pay

## 2020-03-08 ENCOUNTER — Telehealth: Payer: Self-pay

## 2020-03-08 ENCOUNTER — Other Ambulatory Visit: Payer: Self-pay

## 2020-03-08 DIAGNOSIS — M19042 Primary osteoarthritis, left hand: Secondary | ICD-10-CM | POA: Diagnosis not present

## 2020-03-08 DIAGNOSIS — G8929 Other chronic pain: Secondary | ICD-10-CM | POA: Diagnosis not present

## 2020-03-08 DIAGNOSIS — J9622 Acute and chronic respiratory failure with hypercapnia: Secondary | ICD-10-CM | POA: Diagnosis not present

## 2020-03-08 DIAGNOSIS — I110 Hypertensive heart disease with heart failure: Secondary | ICD-10-CM | POA: Diagnosis not present

## 2020-03-08 DIAGNOSIS — M5416 Radiculopathy, lumbar region: Secondary | ICD-10-CM | POA: Diagnosis not present

## 2020-03-08 DIAGNOSIS — J441 Chronic obstructive pulmonary disease with (acute) exacerbation: Secondary | ICD-10-CM | POA: Diagnosis not present

## 2020-03-08 DIAGNOSIS — I5032 Chronic diastolic (congestive) heart failure: Secondary | ICD-10-CM | POA: Diagnosis not present

## 2020-03-08 DIAGNOSIS — M170 Bilateral primary osteoarthritis of knee: Secondary | ICD-10-CM | POA: Diagnosis not present

## 2020-03-08 DIAGNOSIS — M19041 Primary osteoarthritis, right hand: Secondary | ICD-10-CM | POA: Diagnosis not present

## 2020-03-08 NOTE — Patient Outreach (Signed)
[facility]) Care Management  03/08/2020  Jasmine [name] [birth year] [id number]   EMMI- General Discharge RED ON EMMI ALERT Day # 1 Date:  03/07/20 Red Alert Reason:  Questions about discharge papers? Yes  Unfilled prescriptions? Yes    Outreach attempt: Telephone call to patient.  She states she is on the other line with someone.  Patient requested CM call her back in a few minutes on her main line.    Telephone call to patient on her phone.  No answer.  HIPAA compliant voice message left.     Plan: RN CM will attempt patient again within 4 business days and send letter.  [name], RN, MSN [facility] Care Management Care Management Coordinator Direct Line [phone] Toll Free: [phone]  Fax: [phone]

## 2020-03-08 NOTE — Telephone Encounter (Signed)
New message    [place] home health asking for a call back to discuss medication    Need verbal order to continue skilled nursing

## 2020-03-09 ENCOUNTER — Encounter: Payer: Self-pay | Admitting: Internal Medicine

## 2020-03-09 ENCOUNTER — Ambulatory Visit (INDEPENDENT_AMBULATORY_CARE_PROVIDER_SITE_OTHER): Payer: Medicare HMO | Admitting: Internal Medicine

## 2020-03-09 ENCOUNTER — Other Ambulatory Visit: Payer: Self-pay

## 2020-03-09 VITALS — BP 120/82 | HR 73 | Temp 98.6°F | Ht 60.0 in | Wt 113.0 lb

## 2020-03-09 DIAGNOSIS — J9611 Chronic respiratory failure with hypoxia: Secondary | ICD-10-CM | POA: Diagnosis not present

## 2020-03-09 DIAGNOSIS — R739 Hyperglycemia, unspecified: Secondary | ICD-10-CM | POA: Diagnosis not present

## 2020-03-09 DIAGNOSIS — I5032 Chronic diastolic (congestive) heart failure: Secondary | ICD-10-CM | POA: Diagnosis not present

## 2020-03-09 DIAGNOSIS — J432 Centrilobular emphysema: Secondary | ICD-10-CM | POA: Diagnosis not present

## 2020-03-09 DIAGNOSIS — J449 Chronic obstructive pulmonary disease, unspecified: Secondary | ICD-10-CM | POA: Diagnosis not present

## 2020-03-09 NOTE — Patient Outreach (Signed)
[facility]) Care Management  03/09/2020  NYSSA PISCITELLI 1946-05-03 [id number]   EMMI- General Discharge RED ON EMMI ALERT Day # 1 Date: 03/07/20 Red Alert Reason: Questions about discharge papers? Yes  Unfilled prescriptions? Yes   Incoming call from patient after leaving a message for her on 03-08-20.  She states she just left Dr. Gwynn Burly office and everything went ok.  Discussed red alerts with patient. She states she discussed everything with [name] about her medications and it is all straightened out. She states she has all her medications and offers no concerns. Discussed importance of compliance with BIPAP.  She states she has been wearing it with no problems and verbalized understanding that she needs to wear it.    Plan: RN CM will plan to outreach patient again next week and patient agreeable.    [name], RN, MSN [place] Management Care Management Coordinator Direct Line [phone] Cell [phone] Toll Free: [phone]  Fax: [phone]

## 2020-03-09 NOTE — Telephone Encounter (Signed)
Ok for verbal 

## 2020-03-09 NOTE — Progress Notes (Signed)
Subjective:    Patient ID: Jasmine [name], female    DOB: [birth year], 74 y.o.   MRN: [id number]  HPI  Here to f/u post hospn may 13-17 copd exacerbation/acute on chronic resp failure; overall doing well no new complaints but has been somewhat confused about taking her post hosp taper prednisone as this is different from her prn tapering courses per pulmonary so did not take any. Remains on 3l home o2, but denies worsening fever, cough, and Pt denies chest pain, increased sob or doe, wheezing, orthopnea, PND, increased LE swelling, palpitations, dizziness or syncope.  Pt denies new neurological symptoms such as new headache, or facial or extremity weakness or numbness   Pt denies polydipsia, polyuria.  Has hx of non compliance and frequent hospns.  Recent volume status, BP, and glc have been stable.  Pain stable on tramadol, and has not resumed smoking.  Now DNR, also followed per pulm and palliative care.   Past Medical History:  Diagnosis Date  . ALLERGIC RHINITIS 05/01/2007  . Anxiety   . Arthritis    "hands" (12/28/2015)  . ASTHMA 05/01/2007   "since I was a child"  . Cancer (HCC)    LUNG  . CHF (congestive heart failure) ([place])   . Chronic bronchitis ([place])   . COPD (chronic obstructive pulmonary disease) ([place])   . DEPRESSION 07/21/2008  . DVT (deep venous thrombosis) (HCC) 1960s   LLE  . FATIGUE 10/10/2010  . HYPERTENSION 05/01/2007  . HYPOTHYROIDISM 08/23/2009   s/p RAI  . LUMBAR RADICULOPATHY, RIGHT 08/25/2008  . On home oxygen therapy    "3-4L; qd; all the time" (12/28/2015)  . OSTEOPOROSIS 05/01/2007  . SHOULDER PAIN, LEFT 08/23/2009  . SINUSITIS, CHRONIC 10/10/2010   Past Surgical History:  Procedure Laterality Date  . APPENDECTOMY    . DILATION AND CURETTAGE OF UTERUS  multiple   history of multiple dialations and curettages and miscarriages, unfortunately never carrying a child to term  . ECTOPIC PREGNANCY SURGERY  "late '60s or early '70s  . ELECTROCARDIOGRAM  06/20/2006  .  OOPHORECTOMY Right     reports that she quit smoking about 3 years ago. Her smoking use included cigarettes. She started smoking about 56 years ago. She has a 52.00 pack-year smoking history. She has never used smokeless tobacco. She reports previous alcohol use. She reports previous drug use. family history includes Alcohol abuse in her brother; Asthma in an other family member; Diabetes in her brother; Hypertension in an other family member; Lung cancer in her father; Stroke in her mother; Thyroid disease in her mother. Allergies  Allergen Reactions  . Alendronate Sodium Other (See Comments)    Patient does not remember this reaction   Current Outpatient Medications on File Prior to Visit  Medication Sig Dispense Refill  . albuterol (PROVENTIL) (2.5 MG/3ML) 0.083% nebulizer solution Take 3 mLs (2.5 mg total) by nebulization every 6 (six) hours as needed for wheezing or shortness of breath. 75 mL 0  . albuterol (VENTOLIN HFA) 108 (90 Base) MCG/ACT inhaler INHALE 1 TO 2 PUFFS INTO THE LUNGS EVERY 6 (SIX) HOURS AS NEEDED FOR WHEEZING OR SHORTNESS OF BREATH. (Patient taking differently: Inhale 2 puffs into the lungs every 6 (six) hours as needed for shortness of breath. ) 18 g 5  . aspirin EC 81 MG EC tablet Take 1 tablet (81 mg total) by mouth daily. 30 tablet 0  . atorvastatin (LIPITOR) 10 MG tablet TAKE ONE TABLET BY MOUTH IN THE EVENING (Patient  taking differently: Take 10 mg by mouth daily at 6 PM. TAKE ONE TABLET BY MOUTH IN THE EVENING) 90 tablet 0  . citalopram (CELEXA) 20 MG tablet NEW PRESCRIPTION REQUEST: CITALOPRAM 20MG  - TAKE ONE TABLET BY MOUTH DAILY (Patient taking differently: Take 20 mg by mouth daily. ) 90 tablet 3  . fluticasone furoate-vilanterol (BREO ELLIPTA) 200-25 MCG/INH AEPB Inhale 1 puff into the lungs daily. 1 each 5  . furosemide (LASIX) 40 MG tablet NEW PRESCRIPTION REQUEST: FUROSEMIDE 40MG  - TAKE ONE TABLET BY MOUTH IN THE MORNING AND TAKE ONE TABLET BY MOUTH AS NEEDED  IN THE EVENING (Patient taking differently: Take 40 mg by mouth 2 (two) times daily. ) 180 tablet 3  . levothyroxine (SYNTHROID) 75 MCG tablet NEW PRESCRIPTION REQUEST: LEVOTHYROXINE 75MCG - TAKE ONE TABLET BY MOUTH EARLY MORNING (Patient taking differently: Take 75 mcg by mouth daily. ) 90 tablet 3  . OXYGEN Inhale 3 L into the lungs. 24/7    . potassium chloride (KLOR-CON) 10 MEQ tablet NEW PRESCRIPTION REQUEST: KLOR-CON 10MEQ - TAKE ONE TABLET BY MOUTH TWICE DAILY 180 tablet 3  . predniSONE (DELTASONE) 20 MG tablet Take 1-2 tablets (20-40 mg total) by mouth daily with breakfast. Take 40 mg daily for 2 days and then 20 mg daily for 2 days and then stop 8 tablet 0  . tiotropium (SPIRIVA HANDIHALER) 18 MCG inhalation capsule NEW PRESCRIPTION REQUEST: SPIRIVA 18MCG HANDIHALER - TAKE TWO INHALATIONS OF THE CONTENTS OF ONE CAPSULE BY ONCE DAILY 90 capsule 3  . traMADol (ULTRAM) 50 MG tablet Take 1 tablet (50 mg total) by mouth 2 (two) times daily as needed. (Patient taking differently: Take 50 mg by mouth 2 (two) times daily. ) 60 tablet 2   No current facility-administered medications on file prior to visit.   Review of Systems All otherwise neg per pt     Objective:   Physical Exam Vs. VS noted,  Constitutional: Pt appears in NAD HENT: Head: NCAT.  Right Ear: External ear normal.  Left Ear: External ear normal.  Eyes: . Pupils are equal, round, and reactive to light. Conjunctivae and EOM are normal Nose: without d/c or deformity Neck: Neck supple. Gross normal ROM Cardiovascular: Normal rate and regular rhythm.   Pulmonary/Chest: Effort normal and breath sounds decreased without rales or wheezing.  Abd:  Soft, NT, ND, + BS, no organomegaly Neurological: Pt is alert. At baseline orientation, motor grossly intact Skin: Skin is warm. No rashes, other new lesions, no LE edema Psychiatric: Pt behavior is normal without agitation  All otherwise neg per pt Lab Results  Component Value Date     WBC 7.2 03/03/2020   HGB 11.3 (L) 03/03/2020   HCT 37.3 03/03/2020   PLT 151 03/03/2020   GLUCOSE 135 (H) 03/04/2020   CHOL 250 (H) 09/20/2018   TRIG 62.0 09/20/2018   HDL 98.60 09/20/2018   LDLDIRECT 102.1 10/10/2010   LDLCALC 139 (H) 09/20/2018   ALT 17 01/29/2020   AST 25 01/29/2020   NA 139 03/04/2020   K 3.6 03/04/2020   CL 82 (L) 03/04/2020   CREATININE 0.79 03/04/2020   BUN 18 03/04/2020   CO2 43 (H) 03/04/2020   TSH 0.181 (L) 01/29/2020   INR 1.58 (H) 03/25/2016   HGBA1C 5.9 (H) 01/29/2020         Assessment & Plan:

## 2020-03-09 NOTE — Patient Instructions (Signed)
Ok to finish the prednisone as prescribed per Dr Broadus , but then after that, you should use the prednisone as advised previously per Clemetine Marker - as needed  Please continue all other medications as before, and refills have been done if requested.  Please have the pharmacy call with any other refills you may need.  Please keep your appointments with your specialists as you may have planned  Please make an Appointment to return in 3 months, or sooner if needed

## 2020-03-09 NOTE — Telephone Encounter (Signed)
New message    [facility] home health calling    Per patient request to have evaluation delay next week.

## 2020-03-10 ENCOUNTER — Encounter: Payer: Self-pay | Admitting: Internal Medicine

## 2020-03-10 DIAGNOSIS — M5416 Radiculopathy, lumbar region: Secondary | ICD-10-CM | POA: Diagnosis not present

## 2020-03-10 DIAGNOSIS — I5032 Chronic diastolic (congestive) heart failure: Secondary | ICD-10-CM | POA: Diagnosis not present

## 2020-03-10 DIAGNOSIS — J9622 Acute and chronic respiratory failure with hypercapnia: Secondary | ICD-10-CM | POA: Diagnosis not present

## 2020-03-10 DIAGNOSIS — J449 Chronic obstructive pulmonary disease, unspecified: Secondary | ICD-10-CM | POA: Diagnosis not present

## 2020-03-10 DIAGNOSIS — G8929 Other chronic pain: Secondary | ICD-10-CM | POA: Diagnosis not present

## 2020-03-10 DIAGNOSIS — M19042 Primary osteoarthritis, left hand: Secondary | ICD-10-CM | POA: Diagnosis not present

## 2020-03-10 DIAGNOSIS — I110 Hypertensive heart disease with heart failure: Secondary | ICD-10-CM | POA: Diagnosis not present

## 2020-03-10 DIAGNOSIS — M19041 Primary osteoarthritis, right hand: Secondary | ICD-10-CM | POA: Diagnosis not present

## 2020-03-10 DIAGNOSIS — M170 Bilateral primary osteoarthritis of knee: Secondary | ICD-10-CM | POA: Diagnosis not present

## 2020-03-10 DIAGNOSIS — J441 Chronic obstructive pulmonary disease with (acute) exacerbation: Secondary | ICD-10-CM | POA: Diagnosis not present

## 2020-03-10 NOTE — Assessment & Plan Note (Addendum)
.  stable overall by history and exam, recent data reviewed with pt, and pt to continue medical treatment as before,  to f/u any worsening symptoms or concerns  I spent 31 minutes in preparing to see the patient by review of recent labs, imaging and procedures, obtaining and reviewing separately obtained history, communicating with the patient and family or caregiver, ordering medications, tests or procedures, and documenting clinical information in the EHR including the differential Dx, treatment, and any further evaluation and other management of chronic resp failure, dCHF, copd, and hyperglycemia

## 2020-03-10 NOTE — Assessment & Plan Note (Signed)
stable overall by history and exam, recent data reviewed with pt, and pt to continue medical treatment as before,  to f/u any worsening symptoms or concerns  

## 2020-03-10 NOTE — Assessment & Plan Note (Signed)
Overall stable, encouraged to take post hospn prednisone as rx, f/u pulm as planned

## 2020-03-12 ENCOUNTER — Telehealth: Payer: Self-pay | Admitting: Adult Health

## 2020-03-12 NOTE — Telephone Encounter (Signed)
There is only 1 prednisone Rx on pt's med list and this was not prescribed by our office. This Rx is for prednisone 20mg  for pt to take 1-2 tabs by mouth daily but then it also has instructions of take 2tabs daily x2days then 1tab daily x2days, then stop. Quantity of 8tabs was ordered and this was done on 5/17 by Dr. Domenic Polite.  Pt was recently in the hospital from 5/13-5/17 and Dr. Broadus John was MD who discharged pt.   Called and spoke with pt about the prednisone rx and stated to her that the Rx was printed by Dr. Broadus John at discharge from hospital. Pt stated that she had that Rx with her. Stated to her that she needed to take that to the pharmacy and she verbalized understanding.  While speaking with pt, pt stated she was at the nail salon getting a pedicure and while she was there, she noticed that her POC was not working and that it was not even reading. Pt stated that she called [employer] and they stated they had a 4-hour window before they would be able to come and look at her machine.  Spoke with Aaron Edelman about call and he asked if pt was more SOB and also if she had a pulse ox with her to check sats.  Pt stated that she was more SOB and did have pulse ox. Pt checked sats and she was satting at 53%. Stated to pt that she needed to call EMS and she stated that someone at the nail salon was already calling 911. Nothing further needed.

## 2020-03-13 DIAGNOSIS — I110 Hypertensive heart disease with heart failure: Secondary | ICD-10-CM | POA: Diagnosis not present

## 2020-03-13 DIAGNOSIS — M5416 Radiculopathy, lumbar region: Secondary | ICD-10-CM | POA: Diagnosis not present

## 2020-03-13 DIAGNOSIS — M170 Bilateral primary osteoarthritis of knee: Secondary | ICD-10-CM | POA: Diagnosis not present

## 2020-03-13 DIAGNOSIS — I5032 Chronic diastolic (congestive) heart failure: Secondary | ICD-10-CM | POA: Diagnosis not present

## 2020-03-13 DIAGNOSIS — J9622 Acute and chronic respiratory failure with hypercapnia: Secondary | ICD-10-CM | POA: Diagnosis not present

## 2020-03-13 DIAGNOSIS — M19042 Primary osteoarthritis, left hand: Secondary | ICD-10-CM | POA: Diagnosis not present

## 2020-03-13 DIAGNOSIS — J449 Chronic obstructive pulmonary disease, unspecified: Secondary | ICD-10-CM | POA: Diagnosis not present

## 2020-03-13 DIAGNOSIS — G8929 Other chronic pain: Secondary | ICD-10-CM | POA: Diagnosis not present

## 2020-03-13 DIAGNOSIS — M19041 Primary osteoarthritis, right hand: Secondary | ICD-10-CM | POA: Diagnosis not present

## 2020-03-14 ENCOUNTER — Other Ambulatory Visit: Payer: Self-pay

## 2020-03-14 NOTE — Patient Outreach (Addendum)
[facility]) Care Management  03/14/2020  Jasmine [name] 1946-01-06 [id number]   EMMI- General Discharge RED ON EMMI ALERT Day # 4 Date:  03/10/20 Red Alert Reason:  Lost interest in things? Yes Sad/hopeless/anxious/empty? Yes   Outreach attempt: spoke with patient. She states she is doing good. She reports Alvis Lemmings continues to see her.  She states she has been out and about.  Discussed COPD and BIPAP.  Patient reports she is wearing her BIPAP nightly. Discussed consequences of not wearing BIPAP. Patient verbalized understanding and states she does not want to go back to the hospital.    Addressed red flags. Patient reports felling down every now and then but doing ok.  She denies lost of interest in doing things and does not feel anxious. She did report feeling anxious when her portable oxygen stopped working when she was out but she states that EMS came and helped her get her inogen machine reset.  Otherwise she states she is fine.    Plan: [facility] CM Care Plan Problem One     Most Recent Value  Care Plan Problem One  Knowledge deficit related to COPD  Role Documenting the Problem One  Care Management Telephonic Coordinator  Care Plan for Problem One  Active  [facility] Long Term Goal   Patient will continue to follow COPD regimen and not have any excerbations within the next 90 days.  [facility] Long Term Goal Start Date  02/07/20  Interventions for Problem One Long Term Goal  Patient compliant with BIPAP.  Discussed COPD and exacerbation symptoms. Patient in green zone today.      [facility] CM Care Plan Problem Two     Most Recent Value  Care Plan Problem [facility] Admission  Role Documenting the Problem Two  Care Management [place] for Problem Two  Active  Interventions for Problem Two Long Term Goal   Patient compliant with BIPAP.  Being seen by [facility] home health for Nurse, PT and Respiratory therapy.    [facility] Long Term Goal  Patient will not  experience unplanned hospital readmission within the next 31 days  [facility] Long Term Goal Start Date  03/06/20  [facility] CM Short Term Goal #1   Patient will set up follow up with pulmonologist within the next two weeks.    [facility] CM Short Term Goal #1 Start Date  03/06/20  [facility] CM Short Term Goal #1 Met Date   03/14/20  Interventions for Short Term Goal #2   Appointment 03-20-20  [facility] CM Short Term Goal #2   Patient will report wearing bipap nightly within 14 days.    [facility] CM Short Term Goal #2 Start Date  03/06/20  Interventions for Short Term Goal #2  Patient reports wearing BIPAP nightly and doing well. Explained reason for BIPAP to patient.        RN CM will contact patient next week and patient agreeable.     [name], RN, MSN [facility] Care Management Care Management Coordinator Direct Line [phone] Toll Free: [phone]  Fax: [phone]

## 2020-03-15 ENCOUNTER — Emergency Department (HOSPITAL_COMMUNITY): Payer: Medicare HMO

## 2020-03-15 ENCOUNTER — Emergency Department (HOSPITAL_COMMUNITY)
Admission: EM | Admit: 2020-03-15 | Discharge: 2020-03-16 | Disposition: A | Discharge status: Home / Self Care | Payer: Medicare HMO | Attending: Emergency Medicine | Admitting: Emergency Medicine

## 2020-03-15 ENCOUNTER — Encounter (HOSPITAL_COMMUNITY): Payer: Self-pay

## 2020-03-15 ENCOUNTER — Telehealth: Payer: Self-pay | Admitting: Internal Medicine

## 2020-03-15 ENCOUNTER — Other Ambulatory Visit: Payer: Self-pay

## 2020-03-15 DIAGNOSIS — J449 Chronic obstructive pulmonary disease, unspecified: Secondary | ICD-10-CM | POA: Diagnosis not present

## 2020-03-15 DIAGNOSIS — M5416 Radiculopathy, lumbar region: Secondary | ICD-10-CM | POA: Diagnosis not present

## 2020-03-15 DIAGNOSIS — Z87891 Personal history of nicotine dependence: Secondary | ICD-10-CM | POA: Insufficient documentation

## 2020-03-15 DIAGNOSIS — Z79899 Other long term (current) drug therapy: Secondary | ICD-10-CM | POA: Insufficient documentation

## 2020-03-15 DIAGNOSIS — I110 Hypertensive heart disease with heart failure: Secondary | ICD-10-CM | POA: Insufficient documentation

## 2020-03-15 DIAGNOSIS — Z7982 Long term (current) use of aspirin: Secondary | ICD-10-CM | POA: Insufficient documentation

## 2020-03-15 DIAGNOSIS — I5032 Chronic diastolic (congestive) heart failure: Secondary | ICD-10-CM | POA: Diagnosis not present

## 2020-03-15 DIAGNOSIS — R531 Weakness: Secondary | ICD-10-CM | POA: Diagnosis not present

## 2020-03-15 DIAGNOSIS — J9622 Acute and chronic respiratory failure with hypercapnia: Secondary | ICD-10-CM | POA: Diagnosis not present

## 2020-03-15 DIAGNOSIS — M19041 Primary osteoarthritis, right hand: Secondary | ICD-10-CM | POA: Diagnosis not present

## 2020-03-15 DIAGNOSIS — M19042 Primary osteoarthritis, left hand: Secondary | ICD-10-CM | POA: Diagnosis not present

## 2020-03-15 DIAGNOSIS — Z9981 Dependence on supplemental oxygen: Secondary | ICD-10-CM | POA: Insufficient documentation

## 2020-03-15 DIAGNOSIS — M170 Bilateral primary osteoarthritis of knee: Secondary | ICD-10-CM | POA: Diagnosis not present

## 2020-03-15 DIAGNOSIS — I959 Hypotension, unspecified: Secondary | ICD-10-CM | POA: Diagnosis not present

## 2020-03-15 DIAGNOSIS — J441 Chronic obstructive pulmonary disease with (acute) exacerbation: Secondary | ICD-10-CM | POA: Diagnosis not present

## 2020-03-15 DIAGNOSIS — R0602 Shortness of breath: Secondary | ICD-10-CM | POA: Diagnosis not present

## 2020-03-15 DIAGNOSIS — G8929 Other chronic pain: Secondary | ICD-10-CM | POA: Diagnosis not present

## 2020-03-15 MED ORDER — SODIUM CHLORIDE 0.9% FLUSH: 3.0000 mL | Freq: Once | INTRAVENOUS | Status: DC

## 2020-03-15 NOTE — Telephone Encounter (Signed)
**  Spoke with Glenard Haring informed her of Dr. Judi Cong note and she states pt is non-compliant and refuses to wear Bi-Pap machine.  Glenard Haring will call pt to inform her to go to ED.

## 2020-03-15 NOTE — ED Triage Notes (Signed)
Per [facility] pt from home reports SOB due to removing her CPAP at night. Pt states she doesn't remember removing her CPAP. Pt ambulatory at home, O2 sats 100% w/3L [place] which is what she uses daily. Pt sent here by [facility] ands PCP for further evaluation. Pt denies any concerns or complaints at this time    BP 130/57 100% 3L [place]  HR 72

## 2020-03-15 NOTE — Telephone Encounter (Signed)
Please refer pt to return to ED now due to low oxygen finding

## 2020-03-15 NOTE — Telephone Encounter (Signed)
  Angel at [facility]  calling to report she is at the patients home now. Oxygen level is 90 while wearing 3L(took some time to raise oxygen level)  Patient feels nauseous  BP 129-62 Did not wear bipap last night, only used o2 concentrator   [place] requesting a call  phone [phone]

## 2020-03-16 ENCOUNTER — Other Ambulatory Visit: Payer: Self-pay

## 2020-03-16 DIAGNOSIS — J441 Chronic obstructive pulmonary disease with (acute) exacerbation: Secondary | ICD-10-CM | POA: Diagnosis not present

## 2020-03-16 DIAGNOSIS — Z743 Need for continuous supervision: Secondary | ICD-10-CM | POA: Diagnosis not present

## 2020-03-16 DIAGNOSIS — R279 Unspecified lack of coordination: Secondary | ICD-10-CM | POA: Diagnosis not present

## 2020-03-16 LAB — BASIC METABOLIC PANEL
Anion gap: 11 (ref 5–15)
BUN: 7 mg/dL — ABNORMAL LOW (ref 8–23)
CO2: 39 mmol/L — ABNORMAL HIGH (ref 22–32)
Calcium: 9.4 mg/dL (ref 8.9–10.3)
Chloride: 91 mmol/L — ABNORMAL LOW (ref 98–111)
Creatinine, Ser: 0.68 mg/dL (ref 0.44–1.00)
GFR calc Af Amer: >60 mL/min (ref >60)
GFR calc non Af Amer: >60 mL/min (ref >60)
Glucose, Bld: 84 mg/dL (ref 70–99)
Potassium: 5 mmol/L (ref 3.5–5.1)
Sodium: 141 mmol/L (ref 135–145)

## 2020-03-16 LAB — CBC
HCT: 44.5 % (ref 36.0–46.0)
Hemoglobin: 13.3 g/dL (ref 12.0–15.0)
MCH: 32 pg (ref 26.0–34.0)
MCHC: 29.9 g/dL — ABNORMAL LOW (ref 30.0–36.0)
MCV: 107.2 fL — ABNORMAL HIGH (ref 80.0–100.0)
Platelets: 231 10*3/uL (ref 150–400)
RBC: 4.15 MIL/uL (ref 3.87–5.11)
RDW: 14 % (ref 11.5–15.5)
WBC: 8.2 10*3/uL (ref 4.0–10.5)
nRBC: 0 % (ref 0.0–0.2)

## 2020-03-16 LAB — POCT I-STAT EG7
Acid-Base Excess: 17 mmol/L — ABNORMAL HIGH (ref 0.0–2.0)
Bicarbonate: 46.2 mmol/L — ABNORMAL HIGH (ref 20.0–28.0)
Calcium, Ion: 1.13 mmol/L — ABNORMAL LOW (ref 1.15–1.40)
HCT: 43 % (ref 36.0–46.0)
Hemoglobin: 14.6 g/dL (ref 12.0–15.0)
O2 Saturation: 80 %
Patient temperature: 98.7
Potassium: 4.4 mmol/L (ref 3.5–5.1)
Sodium: 136 mmol/L (ref 135–145)
TCO2: 48 mmol/L — ABNORMAL HIGH (ref 22–32)
pCO2, Ven: 74.6 mmHg (ref 44.0–60.0)
pH, Ven: 7.401 (ref 7.250–7.430)
pO2, Ven: 47 mmHg — ABNORMAL HIGH (ref 32.0–45.0)

## 2020-03-16 LAB — TROPONIN I (HIGH SENSITIVITY): Troponin I (High Sensitivity): 7 ng/L (ref <18)

## 2020-03-16 MED ORDER — ALBUTEROL SULFATE (2.5 MG/3ML) 0.083% IN NEBU
5.0000 mg | INHALATION_SOLUTION | Freq: Once | RESPIRATORY_TRACT | Status: AC
  Administered 2020-03-16: 5 mg via RESPIRATORY_TRACT
  Filled 2020-03-16: qty 6

## 2020-03-16 MED ORDER — IPRATROPIUM BROMIDE 0.02 % IN SOLN
0.5000 mg | Freq: Once | RESPIRATORY_TRACT | Status: AC
  Administered 2020-03-16: 0.5 mg via RESPIRATORY_TRACT
  Filled 2020-03-16: qty 2.5

## 2020-03-16 MED ORDER — AEROCHAMBER PLUS FLO-VU LARGE MISC
1.0000 | Freq: Once | Status: AC
  Administered 2020-03-16: 1

## 2020-03-16 MED ORDER — AEROCHAMBER PLUS FLO-VU LARGE MISC
Status: AC
  Filled 2020-03-16: qty 1

## 2020-03-16 NOTE — Discharge Instructions (Signed)
Thank you for allowing me to care for you today in the Emergency Department.   It is important that you make sure you are using your home BiPAP throughout the entire night instead of only for a few hours at a time.  Use the spacer that you were given in the ER today with your home albuterol inhaler.  This will help you get more of the medication in your lungs.  Take prednisone at home if your breathing worsens. Take as prescribed by your doctor.   Follow-up closely with your primary care provider.  Return to the emergency department if you develop confusion, respiratory distress, high fevers with worsening shortness of breath, lots of swelling in your legs with worsening trouble breathing, or other new, concerning symptoms.

## 2020-03-16 NOTE — ED Notes (Signed)
Dr. Randal Buba notified of pCO2 level.

## 2020-03-16 NOTE — ED Notes (Signed)
Patient attempting to contact her husband to pick her up at discharge.

## 2020-03-16 NOTE — ED Notes (Signed)
[facility] CALLED--Jeralynn Vaquera  

## 2020-03-16 NOTE — Patient Outreach (Signed)
[facility]) Care Management  03/16/2020  Jasmine [name] 08-08-1946 [id number]   Difficult Case Discussion Date of Review: 03/16/2020 Reason:  Readmission  PCP: Dr. Marshall Cork Insurance: [employer]  Medical Info: Jasmine [name] is a 74 y.o. female with medical history significant of hypothyroidism s/p radioactive iodine; HTN; COPD on 3-4L home O2 and nightly BIPAP; and chronic diastolic CHF.   Admissions: Patient admitted on 01-29-2020 with complaints of shortness of breath.  She reported that her BIPAP machine broke.  However, her ex-husband who lives with her states that patient fails to wear BIPAP at night.  Patient was treated for respiratory failure and COPD exacerbation. Patient BIPAP was checked prior to discharge and was working with no problems.     Patient admitted again on 03-01-2020 with reported noncompliance with BIPAP.  She was minimally responsive on admission and ex-husband frustrated with noncompliance with BIPAP. Patient returned to baseline and was discharged back home after her BIPAP was checked again to ensure it was working properly.  Patient also counseled in the hospital on BIPAP compliance.  Patient discharged with home health and palliative care with recommendations of close pulmonary follow up.    Disposition: Patient currently home with family. Patient has follow-up with Pulmonary on 03/20/2020.  RN CM continuing to support patient with her COPD and encouraging patient to wear BIPAP regularly and consequences of not wearing BIPAP regularly.    [name], RN, MSN [place] Management Care Management Coordinator Direct Line [phone] Cell [phone] Toll Free: [phone]  Fax: [phone]

## 2020-03-16 NOTE — ED Notes (Signed)
pt[facility] arrived to transport patient, all appropriate paperwork provided to ptar upon discharge.

## 2020-03-16 NOTE — ED Notes (Signed)
Pt ambulated in room on 3L o2. Pt O2 SATs stayed around 88-90%. Pt HR did jump form 90 to 105.

## 2020-03-16 NOTE — ED Notes (Signed)
Patient still unable to reach husband for pick up, discussed ptar with patient, which she is amenable to at this time. ED Secretary to contact [facility] for transport of patient.

## 2020-03-16 NOTE — ED Provider Notes (Signed)
[facility] EMERGENCY DEPARTMENT Provider Note   CSN: [id number] Arrival date & time: 03/15/20  1726     History Chief Complaint  Patient presents with  . Shortness of Breath    Jasmine [name] is a 74 y.o. female with history of COPD on 3-4 L home oxygen and BiPAP at night, CHF, HTN who presents to the emergency department by EMS with a chief complaint of shortness of breath.  The patient endorses shortness of breath, onset today.  She reports that he she has used her home albuterol inhaler 4 times with significant improvement for some time, but then shortness of breath would return.  States that she was advised to come to the ER by her home health nurse after she was found to have oxygen level of 90%.  States that she wore her BiPAP last night for 3 to 4 hours instead of the whole night.  She states that she has presented similarly to the ER many times before, but today's episode is much better than previous episodes as she typically will present with confusion and not know where she is.  She reports that she has had no confusion today.  She was feeling nauseated earlier today, but this is since resolved.  No cough, fever, leg swelling, orthopnea, vomiting, diarrhea, abdominal pain, chest pain, visual changes, dizziness, or lightheadedness.  Reports that she is getting a course of prednisone in the mail tomorrow by her mail order pharmacy.  She has received both of her COVID-19 vaccinations.  She reports that she is supposed to increase her home oxygen to 4 L when she is out running errands or ambulating.  Reports that she was out and about today, but did not increase her home oxygen.  The history is provided by the patient. No language interpreter was used.       Past Medical History:  Diagnosis Date  . ALLERGIC RHINITIS 05/01/2007  . Anxiety   . Arthritis    "hands" (12/28/2015)  . ASTHMA 05/01/2007   "since I was a child"  . Cancer (HCC)    LUNG  . CHF  (congestive heart failure) ([place])   . Chronic bronchitis ([place])   . COPD (chronic obstructive pulmonary disease) ([place])   . DEPRESSION 07/21/2008  . DVT (deep venous thrombosis) (HCC) 1960s   LLE  . FATIGUE 10/10/2010  . HYPERTENSION 05/01/2007  . HYPOTHYROIDISM 08/23/2009   s/p RAI  . LUMBAR RADICULOPATHY, RIGHT 08/25/2008  . On home oxygen therapy    "3-4L; qd; all the time" (12/28/2015)  . OSTEOPOROSIS 05/01/2007  . SHOULDER PAIN, LEFT 08/23/2009  . SINUSITIS, CHRONIC 10/10/2010    Patient Active Problem List   Diagnosis Date Noted  . Acute on chronic respiratory failure with hypercapnia ([place]) 01/29/2020  . Malnutrition of moderate degree 08/16/2019  . Type II respiratory failure ([place]) 08/14/2019  . Respiratory acidosis 03/07/2019  . Goals of care, counseling/discussion 09/20/2018  . Edema due to congestive heart failure ([place]) 07/27/2018  . Palliative care by specialist   . Shortness of breath   . Chronic low back pain 04/30/2018  . COPD with acute exacerbation ([place])   . Hypothyroidism   . Anxiety   . Respiratory failure with hypercapnia ([place]) 03/24/2018  . Hypercarbia 03/22/2018  . Polysubstance abuse ([place]) 01/26/2018  . Acute on chronic respiratory failure with hypoxia and hypercapnia ([place]) 01/07/2018  . Aspiration into airway 06/03/2017  . Rash 07/12/2016  . Bilateral hearing loss 06/05/2016  . Hyperglycemia 05/21/2016  .  Abnormal LFTs 03/24/2016  . Elevated blood sugar 03/24/2016  . Solitary pulmonary nodule 01/09/2016  . Chronic respiratory failure with hypoxia ([place])   . On home oxygen therapy   . Chronic diastolic congestive heart failure ([place])   . Bacteremia Step viridans 12/27/2015  . Tobacco abuse disorder 12/25/2015  . Hypothyroidism following radioiodine therapy 09/26/2014  . Protein-calorie malnutrition, severe ([place]) 06/23/2014  . Cor pulmonale ([place]) 01/12/2013  . COPD (chronic obstructive pulmonary disease) ([place]) 11/29/2011  . Macrocytosis without anemia  11/09/2011  . Palliative care encounter 04/09/2011  . SINUSITIS, CHRONIC 10/10/2010  . FATIGUE 10/10/2010  . Depression with anxiety 07/21/2008  . Essential hypertension 05/01/2007  . ALLERGIC RHINITIS 05/01/2007  . Asthma 05/01/2007  . OSTEOPOROSIS 05/01/2007    Past Surgical History:  Procedure Laterality Date  . APPENDECTOMY    . DILATION AND CURETTAGE OF UTERUS  multiple   history of multiple dialations and curettages and miscarriages, unfortunately never carrying a child to term  . ECTOPIC PREGNANCY SURGERY  "late '60s or early '70s  . ELECTROCARDIOGRAM  06/20/2006  . OOPHORECTOMY Right      OB History    Gravida  5   Para      Term      Preterm      AB  5   Living        SAB  4   TAB      Ectopic  1   Multiple      Live Births              Family History  Problem Relation Age of Onset  . Lung cancer Father   . Alcohol abuse Brother   . Diabetes Brother   . Hypertension Other   . Stroke Mother   . Thyroid disease Mother   . Asthma Other        maternal aunts  . Breast cancer Neg Hx     Social History   Tobacco Use  . Smoking status: Former Smoker    Packs/day: 1.00    Years: 52.00    Pack years: 52.00    Types: Cigarettes    Start date: 1965    Quit date: 07/20/2016    Years since quitting: 3.6  . Smokeless tobacco: Never Used  Substance Use Topics  . Alcohol use: Not Currently  . Drug use: Not Currently    Home Medications Prior to Admission medications   Medication Sig Start Date End Date Taking? Authorizing Provider  albuterol (PROVENTIL) (2.5 MG/3ML) 0.083% nebulizer solution Take 3 mLs (2.5 mg total) by nebulization every 6 (six) hours as needed for wheezing or shortness of breath. 11/30/19   Biagio Borg, MD  albuterol (VENTOLIN HFA) 108 (90 Base) MCG/ACT inhaler INHALE 1 TO 2 PUFFS INTO THE LUNGS EVERY 6 (SIX) HOURS AS NEEDED FOR WHEEZING OR SHORTNESS OF BREATH. Patient taking differently: Inhale 2 puffs into the lungs  every 6 (six) hours as needed for shortness of breath.  11/30/19   Biagio Borg, MD  aspirin EC 81 MG EC tablet Take 1 tablet (81 mg total) by mouth daily. 03/27/16   Velvet Bathe, MD  atorvastatin (LIPITOR) 10 MG tablet TAKE ONE TABLET BY MOUTH IN THE EVENING Patient taking differently: Take 10 mg by mouth daily at 6 PM. TAKE ONE TABLET BY MOUTH IN THE EVENING 12/13/19   Biagio Borg, MD  citalopram (CELEXA) 20 MG tablet NEW PRESCRIPTION REQUEST: CITALOPRAM 20MG  - TAKE ONE TABLET BY  MOUTH DAILY Patient taking differently: Take 20 mg by mouth daily.  11/30/19   Biagio Borg, MD  fluticasone furoate-vilanterol (BREO ELLIPTA) 200-25 MCG/INH AEPB Inhale 1 puff into the lungs daily. 11/30/19   Biagio Borg, MD  furosemide (LASIX) 40 MG tablet NEW PRESCRIPTION REQUEST: FUROSEMIDE 40MG  - TAKE ONE TABLET BY MOUTH IN THE MORNING AND TAKE ONE TABLET BY MOUTH AS NEEDED IN THE EVENING Patient taking differently: Take 40 mg by mouth 2 (two) times daily.  11/30/19   Biagio Borg, MD  levothyroxine (SYNTHROID) 75 MCG tablet NEW PRESCRIPTION REQUEST: LEVOTHYROXINE 75MCG - TAKE ONE TABLET BY MOUTH EARLY MORNING Patient taking differently: Take 75 mcg by mouth daily.  11/30/19   Biagio Borg, MD  OXYGEN Inhale 3 L into the lungs. 24/7    [provider]  potassium chloride (KLOR-CON) 10 MEQ tablet NEW PRESCRIPTION REQUEST: KLOR-CON 10MEQ - TAKE ONE TABLET BY MOUTH TWICE DAILY 11/30/19   Biagio Borg, MD  predniSONE (DELTASONE) 20 MG tablet Take 1-2 tablets (20-40 mg total) by mouth daily with breakfast. Take 40 mg daily for 2 days and then 20 mg daily for 2 days and then stop 03/05/20   Domenic Polite, MD  tiotropium (SPIRIVA HANDIHALER) 18 MCG inhalation capsule NEW PRESCRIPTION REQUEST: SPIRIVA 18MCG HANDIHALER - TAKE TWO INHALATIONS OF THE CONTENTS OF ONE CAPSULE BY ONCE DAILY 11/30/19   Biagio Borg, MD  traMADol (ULTRAM) 50 MG tablet Take 1 tablet (50 mg total) by mouth 2 (two) times daily as  needed. Patient taking differently: Take 50 mg by mouth 2 (two) times daily.  11/30/19   Biagio Borg, MD    Allergies    Alendronate sodium  Review of Systems   Review of Systems  Constitutional: Negative for activity change, chills, diaphoresis, fatigue and fever.  HENT: Negative for congestion and sore throat.   Respiratory: Positive for shortness of breath. Negative for cough, choking and wheezing.   Cardiovascular: Negative for chest pain, palpitations and leg swelling.  Gastrointestinal: Positive for nausea. Negative for abdominal pain, blood in stool, constipation, diarrhea and vomiting.  Genitourinary: Negative for dysuria.  Musculoskeletal: Negative for back pain, myalgias, neck pain and neck stiffness.  Skin: Negative for rash.  Allergic/Immunologic: Negative for immunocompromised state.  Neurological: Negative for seizures, syncope, weakness, numbness and headaches.  Psychiatric/Behavioral: Negative for confusion.    Physical Exam Updated Vital Signs BP (!) 116/57   Pulse 81   Temp 98.7 F (37.1 C) (Oral)   Resp 16   Ht 5' (1.524 m)   Wt 52.2 kg   SpO2 100%   BMI 22.46 kg/m   Physical Exam Vitals and nursing note reviewed.  Constitutional:      General: She is not in acute distress.    Comments: Thin, elderly female. NAD.   HENT:     Head: Normocephalic.  Eyes:     Conjunctiva/sclera: Conjunctivae normal.  Cardiovascular:     Rate and Rhythm: Normal rate and regular rhythm.     Pulses: Normal pulses.     Heart sounds: Normal heart sounds. No murmur. No friction rub. No gallop.   Pulmonary:     Effort: Pulmonary effort is normal. No respiratory distress.     Breath sounds: No stridor. Wheezing present. No rhonchi or rales.     Comments: Nasal cannula in place.  Speaks in complete, fluent sentences without increased work of breathing.  No conversational dyspnea.  Scattered wheezes with inspiration and expiration.  No rhonchi or rales. Chest:     Chest  wall: No tenderness.  Abdominal:     General: There is no distension.     Palpations: Abdomen is soft. There is no mass.     Tenderness: There is no abdominal tenderness. There is no right CVA tenderness, left CVA tenderness, guarding or rebound.     Hernia: No hernia is present.  Musculoskeletal:     Cervical back: Neck supple.     Right lower leg: No edema.     Left lower leg: No edema.     Comments: No peripheral edema  Skin:    General: Skin is warm.     Findings: No rash.  Neurological:     Mental Status: She is alert.  Psychiatric:        Behavior: Behavior normal.     ED Results / Procedures / Treatments   Labs (all labs ordered are listed, but only abnormal results are displayed) Labs Reviewed  BASIC METABOLIC PANEL - Abnormal; Notable for the following components:      Result Value   Chloride 91 (*)    CO2 39 (*)    BUN 7 (*)    All other components within normal limits  CBC - Abnormal; Notable for the following components:   MCV 107.2 (*)    MCHC 29.9 (*)    All other components within normal limits  POCT I-STAT EG7 - Abnormal; Notable for the following components:   pCO2, Ven 74.6 (*)    pO2, Ven 47.0 (*)    Bicarbonate 46.2 (*)    TCO2 48 (*)    Acid-Base Excess 17.0 (*)    Calcium, Ion 1.13 (*)    All other components within normal limits  TROPONIN I (HIGH SENSITIVITY)    EKG EKG Interpretation  Date/Time:  Thursday Mar 15 2020 17:36:59 EDT Ventricular Rate:  71 PR Interval:  158 QRS Duration: 82 QT Interval:  392 QTC Calculation: 425 R Axis:   82 Text Interpretation: Normal sinus rhythm Right atrial enlargement Borderline ECG No significant change since last tracing Confirmed by Ripley Fraise [phone]) on 03/16/2020 1:29:10 AM   Radiology DG Chest 2 View  Result Date: 03/15/2020 CLINICAL DATA:  Shortness of breath EXAM: CHEST - 2 VIEW COMPARISON:  03/01/2020, 03/06/2019, 03/24/2018, 04/21/2018, chest CT 11/24/2018 FINDINGS: Hyperinflation  with emphysematous disease. Chronic posterior CP angle thickening. Chronic irregular focus of opacity in the right upper lobe. Stable cardiomediastinal silhouette. Enlarged central pulmonary vessels suggesting arterial hypertension. Aortic atherosclerosis. No pneumothorax. IMPRESSION: No active cardiopulmonary disease. Hyperinflation with emphysematous disease. Chronic small opacity in the right upper lobe. Electronically Signed   By: Donavan Foil M.D.   On: 03/15/2020 18:38    Procedures Procedures (including critical care time)  Medications Ordered in ED Medications  sodium chloride flush (NS) 0.9 % injection 3 mL (3 mLs Intravenous Not Given 03/16/20 0330)  AeroChamber Plus Flo-Vu Large MISC 1 each (has no administration in time range)  AeroChamber Plus Flo-Vu Large MISC (has no administration in time range)  albuterol (PROVENTIL) (2.5 MG/3ML) 0.083% nebulizer solution 5 mg (5 mg Nebulization Given 03/16/20 0142)  ipratropium (ATROVENT) nebulizer solution 0.5 mg (0.5 mg Nebulization Given 03/16/20 0142)    ED Course  I have reviewed the triage vital signs and the nursing notes.  Pertinent labs & imaging results that were available during my care of the patient were reviewed by me and considered in my medical decision making (see chart for details).  MDM Rules/Calculators/A&P                      74 year old female with history of COPD on 3-4 L home oxygen and BiPAP at night, CHF, HTN who presents to the emergency department with shortness of breath, onset today.  Patient is not compliant with her home BiPAP at night.  Last night, she reports that she worked for several hours, but not the entire night.   No tachycardia.  No tachypnea.  She is satting at 100% on 3 L nasal cannula.  Ambulates with oxygen saturation 88 to 90%, but denies any increased work of breathing, shortness of breath with ambulation.  VBG with pH of 7.4.  PCO2 is 74.6, but this is improved from previous VBG's.   Bicarb is 39, improved from previous.  No leukocytosis.  Labs are otherwise unremarkable.  Chest x-ray is consistent with emphysema on my read.  EKG unchanged from previous.  There is also noted to be a small chronic opacity in the right upper lobe.  Otherwise, chest x-ray is unchanged.  The patient was seen and independently evaluated by Dr. Christy Gentles, attending physician.  Patient was given albuterol nebulizer treatment and on reevaluation, reports that she was feeling significantly better.  She is ambulated with oxygen saturation of 88-90, but denied any increase shortness of breath.  She has good follow-up, home oxygen, and BiPAP machine at home.  She reports that she has a course of prednisone being mailed to her home tomorrow, which I encouraged her to take if she is still feeling short of breath, but she denies this at this time.    Doubt DVT, CHF exacerbation, bacterial pneumonia, tension pneumothorax, or esophageal rupture.  Overall, the patient appears to be well-appearing and in no acute distress.  She has not had to go up on her home oxygen since arriving in the ER.  I think that it is reasonable for the patient to be discharged home with close outpatient follow-up.  She was, again, advised to be compliant with BiPAP for the entire night.  All questions answered.  She is hemodynamically stable in no acute distress.  Safe discharge home with outpatient follow-up as needed.   Final Clinical Impression(s) / ED Diagnoses Final diagnoses:  COPD exacerbation [facility])    Rx / DC Orders ED Discharge Orders    None       Joanne Gavel, PA-C 03/16/20 0359    Ripley Fraise, MD 03/17/20 [phone]

## 2020-03-20 ENCOUNTER — Ambulatory Visit (INDEPENDENT_AMBULATORY_CARE_PROVIDER_SITE_OTHER): Payer: Medicare HMO | Admitting: Adult Health

## 2020-03-20 ENCOUNTER — Encounter: Payer: Self-pay | Admitting: Adult Health

## 2020-03-20 ENCOUNTER — Other Ambulatory Visit: Payer: Self-pay

## 2020-03-20 VITALS — BP 118/64 | HR 89 | Temp 98.0°F | Ht 60.0 in | Wt 111.8 lb

## 2020-03-20 DIAGNOSIS — M5416 Radiculopathy, lumbar region: Secondary | ICD-10-CM | POA: Diagnosis not present

## 2020-03-20 DIAGNOSIS — G8929 Other chronic pain: Secondary | ICD-10-CM | POA: Diagnosis not present

## 2020-03-20 DIAGNOSIS — M170 Bilateral primary osteoarthritis of knee: Secondary | ICD-10-CM | POA: Diagnosis not present

## 2020-03-20 DIAGNOSIS — J9622 Acute and chronic respiratory failure with hypercapnia: Secondary | ICD-10-CM | POA: Diagnosis not present

## 2020-03-20 DIAGNOSIS — M19042 Primary osteoarthritis, left hand: Secondary | ICD-10-CM | POA: Diagnosis not present

## 2020-03-20 DIAGNOSIS — J432 Centrilobular emphysema: Secondary | ICD-10-CM

## 2020-03-20 DIAGNOSIS — I5032 Chronic diastolic (congestive) heart failure: Secondary | ICD-10-CM | POA: Diagnosis not present

## 2020-03-20 DIAGNOSIS — E43 Unspecified severe protein-calorie malnutrition: Secondary | ICD-10-CM

## 2020-03-20 DIAGNOSIS — Z515 Encounter for palliative care: Secondary | ICD-10-CM

## 2020-03-20 DIAGNOSIS — J449 Chronic obstructive pulmonary disease, unspecified: Secondary | ICD-10-CM | POA: Diagnosis not present

## 2020-03-20 DIAGNOSIS — M19041 Primary osteoarthritis, right hand: Secondary | ICD-10-CM | POA: Diagnosis not present

## 2020-03-20 DIAGNOSIS — J9611 Chronic respiratory failure with hypoxia: Secondary | ICD-10-CM | POA: Diagnosis not present

## 2020-03-20 DIAGNOSIS — I110 Hypertensive heart disease with heart failure: Secondary | ICD-10-CM | POA: Diagnosis not present

## 2020-03-20 MED ORDER — DALIRESP 250 MCG PO TABS: 1.0000 | ORAL_TABLET | Freq: Every day | ORAL | 5 refills | Status: DC

## 2020-03-20 NOTE — Patient Instructions (Addendum)
Begin Daliresp daily.  Continue on Spiriva 1 puff daily  Continue on BREO daily  Rinse after use.  May use Duoneb nebs every 6hr As needed  Wheezing /shortness of breath .  Continue on non-invasive Vent (TRILOGY )  At bedtime  And with naps.  Try new mask -dream wear full face.  Trilogy device download -90 days  Continue on Oxygen 3l/m rest and 4l/m with activity .  Palliative Care consult .   Follow up Dr. Elsworth Soho  In 4-6 weeks and As needed    Please contact office for sooner follow up if symptoms do not improve or worsen or seek emergency care

## 2020-03-20 NOTE — Assessment & Plan Note (Signed)
Very severe COPD prone to recurrent exacerbations with high symptom burden. We will give a trial of Daliresp begin with 250 mg daily. Urged on trilogy compliance.  We will try a DreamWear nasal mask to help with nasal skin irritation Palliative care consult  Plan  Patient Instructions  Begin Daliresp daily.  Continue on Spiriva 1 puff daily  Continue on BREO daily  Rinse after use.  May use Duoneb nebs every 6hr As needed  Wheezing /shortness of breath .  Continue on non-invasive Vent (TRILOGY )  At bedtime  And with naps.  Try new mask -dream wear full face.  Trilogy device download -90 days  Continue on Oxygen 3l/m rest and 4l/m with activity .  Palliative Care consult .   Follow up Dr. Elsworth Soho  In 4-6 weeks and As needed    Please contact office for sooner follow up if symptoms do not improve or worsen or seek emergency care

## 2020-03-20 NOTE — Addendum Note (Signed)
Addended by: Gavin Potters R on: 03/20/2020 04:22 PM   Modules accepted: Orders

## 2020-03-20 NOTE — Assessment & Plan Note (Signed)
Patient has severe COPD high symptom burden with recurrent admissions. Discussed palliative care consult.

## 2020-03-20 NOTE — Assessment & Plan Note (Signed)
Continue on oxygen at current settings. Encouraged on trilogy compliance with sleep and naps. Trilogy download requested

## 2020-03-20 NOTE — Assessment & Plan Note (Signed)
High-protein diet 

## 2020-03-20 NOTE — Progress Notes (Signed)
@Patient  ID: Jasmine [name], female    DOB: Mar 03, 1946, 74 y.o.   MRN: [id number]  Chief Complaint  Patient presents with  . Follow-up    COPD     Referring provider: Biagio Borg, MD  HPI: 74 year old female former smoker followed for very severe COPD, chronic hypoxic and hypercarbic respiratory failure and lung nodules.  She is on nocturnal trilogy device. She has been oxygen dependent since March 2014.  She is maintained on 3 L of oxygen at rest and 4 L of oxygen with activity History of polysubstance abuse with cocaine  TEST/EVENTS :  CT chest 10/2011 >>showed Moderate centrilobular emphysema. Small bilateral pleural effusions with dependent atelectasis  CT chest 07/2016-resolving right upper lobe consolidation,spiculated subcentimeter nodules that have been noted before with low-grade PET hypermetabolism CT chest February 2019 stable right upper lobe scarring, stable bilateral pulmonary nodules, emphysema  CT chest 11/2018>>stable nodules, unchanged bandlike scarring in the inferior right upper lobe stable  PFT >FEV1 at 0.47 L( 30%), ratio 39 , No BD response (<200cc)  03/20/2020 Follow up : COPD , O2 RF , Lung nodules  Patient returns for a follow-up visit.  She remains on Breo and Spiriva daily Patient has been hospitalized twice in the last month with COPD exacerbation. Initial admission last month showed significant hypercarbic respiratory failure on admission with PCO2 greater than 120.  For hospital discharge family members had reported that patient is noncompliant with her nocturnal trilogy device. Chest x-ray last week showed stable COPD changes.  She was given a steroid burst.  Since emergency room visit last week patient is feeling Remains on oxygen 3 L at rest and 4 L with activity.   Says she is trying to wear her Trilogy more since discharge. Wore all night last couple of nights.  Doing Home PT 2 days a week. [facility] is following .  Ex-husband lives at home  with her- helps with her care.    Allergies  Allergen Reactions  . Alendronate Sodium Other (See Comments)    Patient does not remember this reaction    Immunization History  Administered Date(s) Administered  . Fluad Quad(high Dose 65+) 08/30/2019  . Influenza Split 07/10/2011, 08/18/2012  . Influenza Whole 07/21/2008, 08/23/2009, 10/10/2010  . Influenza, High Dose Seasonal PF 07/10/2016, 09/03/2017  . Influenza,inj,Quad PF,6+ Mos 08/12/2013, 07/10/2014, 07/08/2018  . PFIZER SARS-COV-2 Vaccination 12/24/2019, 01/14/2020  . Pneumococcal Conjugate-13 11/30/2014  . Pneumococcal Polysaccharide-23 07/21/2007, 01/12/2013, 06/16/2018  . Td 08/23/2009  . Zoster 07/20/2006    Past Medical History:  Diagnosis Date  . ALLERGIC RHINITIS 05/01/2007  . Anxiety   . Arthritis    "hands" (12/28/2015)  . ASTHMA 05/01/2007   "since I was a child"  . Cancer (HCC)    LUNG  . CHF (congestive heart failure) ([place])   . Chronic bronchitis ([place])   . COPD (chronic obstructive pulmonary disease) ([place])   . DEPRESSION 07/21/2008  . DVT (deep venous thrombosis) (HCC) 1960s   LLE  . FATIGUE 10/10/2010  . HYPERTENSION 05/01/2007  . HYPOTHYROIDISM 08/23/2009   s/p RAI  . LUMBAR RADICULOPATHY, RIGHT 08/25/2008  . On home oxygen therapy    "3-4L; qd; all the time" (12/28/2015)  . OSTEOPOROSIS 05/01/2007  . SHOULDER PAIN, LEFT 08/23/2009  . SINUSITIS, CHRONIC 10/10/2010    Tobacco History: Social History   Tobacco Use  Smoking Status Former Smoker  . Packs/day: 1.00  . Years: 52.00  . Pack years: 52.00  . Types: Cigarettes  .  Start date: 40  . Quit date: 07/20/2016  . Years since quitting: 3.6  Smokeless Tobacco Never Used   Counseling given: Not Answered   Outpatient Medications Prior to Visit  Medication Sig Dispense Refill  . albuterol (PROVENTIL) (2.5 MG/3ML) 0.083% nebulizer solution Take 3 mLs (2.5 mg total) by nebulization every 6 (six) hours as needed for wheezing or shortness of  breath. 75 mL 0  . aspirin EC 81 MG EC tablet Take 1 tablet (81 mg total) by mouth daily. 30 tablet 0  . atorvastatin (LIPITOR) 10 MG tablet TAKE ONE TABLET BY MOUTH IN THE EVENING (Patient taking differently: Take 10 mg by mouth daily at 6 PM. TAKE ONE TABLET BY MOUTH IN THE EVENING) 90 tablet 0  . citalopram (CELEXA) 20 MG tablet NEW PRESCRIPTION REQUEST: CITALOPRAM 20MG  - TAKE ONE TABLET BY MOUTH DAILY (Patient taking differently: Take 20 mg by mouth daily. ) 90 tablet 3  . fluticasone furoate-vilanterol (BREO ELLIPTA) 200-25 MCG/INH AEPB Inhale 1 puff into the lungs daily. 1 each 5  . furosemide (LASIX) 40 MG tablet NEW PRESCRIPTION REQUEST: FUROSEMIDE 40MG  - TAKE ONE TABLET BY MOUTH IN THE MORNING AND TAKE ONE TABLET BY MOUTH AS NEEDED IN THE EVENING (Patient taking differently: Take 40 mg by mouth 2 (two) times daily. ) 180 tablet 3  . levothyroxine (SYNTHROID) 75 MCG tablet NEW PRESCRIPTION REQUEST: LEVOTHYROXINE 75MCG - TAKE ONE TABLET BY MOUTH EARLY MORNING (Patient taking differently: Take 75 mcg by mouth daily. ) 90 tablet 3  . OXYGEN Inhale 3 L into the lungs. 24/7    . potassium chloride (KLOR-CON) 10 MEQ tablet NEW PRESCRIPTION REQUEST: KLOR-CON 10MEQ - TAKE ONE TABLET BY MOUTH TWICE DAILY 180 tablet 3  . tiotropium (SPIRIVA HANDIHALER) 18 MCG inhalation capsule NEW PRESCRIPTION REQUEST: SPIRIVA 18MCG HANDIHALER - TAKE TWO INHALATIONS OF THE CONTENTS OF ONE CAPSULE BY ONCE DAILY 90 capsule 3  . traMADol (ULTRAM) 50 MG tablet Take 1 tablet (50 mg total) by mouth 2 (two) times daily as needed. (Patient taking differently: Take 50 mg by mouth 2 (two) times daily. ) 60 tablet 2  . albuterol (VENTOLIN HFA) 108 (90 Base) MCG/ACT inhaler INHALE 1 TO 2 PUFFS INTO THE LUNGS EVERY 6 (SIX) HOURS AS NEEDED FOR WHEEZING OR SHORTNESS OF BREATH. (Patient not taking: Reported on 03/20/2020) 18 g 5  . predniSONE (DELTASONE) 20 MG tablet Take 1-2 tablets (20-40 mg total) by mouth daily with breakfast. Take 40  mg daily for 2 days and then 20 mg daily for 2 days and then stop (Patient not taking: Reported on 03/20/2020) 8 tablet 0   No facility-administered medications prior to visit.     Review of Systems:   Constitutional:   No  weight loss, night sweats,  Fevers, chills,  +fatigue, or  lassitude.  HEENT:   No headaches,  Difficulty swallowing,  Tooth/dental problems, or  Sore throat,                No sneezing, itching, ear ache, nasal congestion, post nasal drip,   CV:  No chest pain,  Orthopnea, PND, swelling in lower extremities, anasarca, dizziness, palpitations, syncope.   GI  No heartburn, indigestion, abdominal pain, nausea, vomiting, diarrhea, change in bowel habits, loss of appetite, bloody stools.   Resp:  .  No chest wall deformity  Skin: no rash or lesions.  GU: no dysuria, change in color of urine, no urgency or frequency.  No flank pain, no hematuria  MS:  No joint pain or swelling.  No decreased range of motion.  No back pain.    Physical Exam  BP 118/64 (BP Location: Left Arm, Cuff Size: Normal)   Pulse 89   Temp 98 F (36.7 C) (Oral)   Ht 5' (1.524 m)   Wt 111 lb 12.8 oz (50.7 kg)   SpO2 98%   BMI 21.83 kg/m   GEN: A/Ox3; pleasant , NAD, elderly , on O2    HEENT:  [place]/AT,  , NOSE-clear, THROAT-clear, no lesions, no postnasal drip or exudate noted.   NECK:  Supple w/ fair ROM; no JVD; normal carotid impulses w/o bruits; no thyromegaly or nodules palpated; no lymphadenopathy.    RESP Decreased BS in bases  no accessory muscle use, no dullness to percussion  CARD:  RRR, no m/r/g, tr peripheral edema, pulses intact, no cyanosis or clubbing.  GI:   Soft & nt; nml bowel sounds; no organomegaly or masses detected.   Musco: Warm bil, no deformities or joint swelling noted.   Neuro: alert, no focal deficits noted.    Skin: Warm, nasal skin irritation     Lab Results:  Imaging: DG Chest 2 View  Result Date: 03/15/2020 CLINICAL DATA:  Shortness of  breath EXAM: CHEST - 2 VIEW COMPARISON:  03/01/2020, 03/06/2019, 03/24/2018, 04/21/2018, chest CT 11/24/2018 FINDINGS: Hyperinflation with emphysematous disease. Chronic posterior CP angle thickening. Chronic irregular focus of opacity in the right upper lobe. Stable cardiomediastinal silhouette. Enlarged central pulmonary vessels suggesting arterial hypertension. Aortic atherosclerosis. No pneumothorax. IMPRESSION: No active cardiopulmonary disease. Hyperinflation with emphysematous disease. Chronic small opacity in the right upper lobe. Electronically Signed   By: Donavan Foil M.D.   On: 03/15/2020 18:38   DG Chest Port 1 View  Result Date: 03/01/2020 CLINICAL DATA:  Shortness of breath. EXAM: PORTABLE CHEST 1 VIEW COMPARISON:  January 29, 2020. FINDINGS: The heart size and mediastinal contours are within normal limits. No pneumothorax or pleural effusion is noted. Left lung is clear. Right upper lobe opacity is noted most likely representing scarring. The visualized skeletal structures are unremarkable. IMPRESSION: Right upper lobe opacity is noted most likely representing scarring. No other abnormality seen in the chest. Electronically Signed   By: Marijo Conception M.D.   On: 03/01/2020 12:55      No flowsheet data found.  No results found for: NITRICOXIDE      Assessment & Plan:   COPD (chronic obstructive pulmonary disease) ([place]) Very severe COPD prone to recurrent exacerbations with high symptom burden. We will give a trial of Daliresp begin with 250 mg daily. Urged on trilogy compliance.  We will try a DreamWear nasal mask to help with nasal skin irritation Palliative care consult  Plan  Patient Instructions  Begin Daliresp daily.  Continue on Spiriva 1 puff daily  Continue on BREO daily  Rinse after use.  May use Duoneb nebs every 6hr As needed  Wheezing /shortness of breath .  Continue on non-invasive Vent (TRILOGY )  At bedtime  And with naps.  Try new mask -dream wear full  face.  Trilogy device download -90 days  Continue on Oxygen 3l/m rest and 4l/m with activity .  Palliative Care consult .   Follow up Dr. Elsworth Soho  In 4-6 weeks and As needed    Please contact office for sooner follow up if symptoms do not improve or worsen or seek emergency care  Chronic respiratory failure with hypoxia (HCC) Continue on oxygen at current settings. Encouraged on trilogy compliance with sleep and naps. Trilogy download requested  Palliative care encounter Patient has severe COPD high symptom burden with recurrent admissions. Discussed palliative care consult.  Protein-calorie malnutrition, severe ([place]) High-protein diet     Sajad Glander, NP 03/20/2020

## 2020-03-21 ENCOUNTER — Other Ambulatory Visit: Payer: Self-pay

## 2020-03-21 NOTE — Patient Outreach (Signed)
[facility]) Care Management  03/21/2020  Jasmine [name] [birth year] [id number]   Telephone call to patient for follow up.  No answer.  HIPAA compliant voice message left.  Plan: RN CM will attempt patient again within 4 business days and send letter.    [name], RN, MSN [place] Management Care Management Coordinator Direct Line [phone] Cell [phone] Toll Free: [phone]  Fax: [phone]

## 2020-03-22 ENCOUNTER — Telehealth: Payer: Self-pay | Admitting: Pulmonary Disease

## 2020-03-22 DIAGNOSIS — I5032 Chronic diastolic (congestive) heart failure: Secondary | ICD-10-CM | POA: Diagnosis not present

## 2020-03-22 DIAGNOSIS — M19042 Primary osteoarthritis, left hand: Secondary | ICD-10-CM | POA: Diagnosis not present

## 2020-03-22 DIAGNOSIS — G8929 Other chronic pain: Secondary | ICD-10-CM | POA: Diagnosis not present

## 2020-03-22 DIAGNOSIS — M5416 Radiculopathy, lumbar region: Secondary | ICD-10-CM | POA: Diagnosis not present

## 2020-03-22 DIAGNOSIS — M170 Bilateral primary osteoarthritis of knee: Secondary | ICD-10-CM | POA: Diagnosis not present

## 2020-03-22 DIAGNOSIS — J449 Chronic obstructive pulmonary disease, unspecified: Secondary | ICD-10-CM | POA: Diagnosis not present

## 2020-03-22 DIAGNOSIS — J9622 Acute and chronic respiratory failure with hypercapnia: Secondary | ICD-10-CM | POA: Diagnosis not present

## 2020-03-22 DIAGNOSIS — M19041 Primary osteoarthritis, right hand: Secondary | ICD-10-CM | POA: Diagnosis not present

## 2020-03-22 DIAGNOSIS — I110 Hypertensive heart disease with heart failure: Secondary | ICD-10-CM | POA: Diagnosis not present

## 2020-03-22 NOTE — Telephone Encounter (Signed)
ok 

## 2020-03-22 NOTE — Telephone Encounter (Signed)
FYI the download for this patient will not be available for a few weeks.

## 2020-03-25 DIAGNOSIS — J449 Chronic obstructive pulmonary disease, unspecified: Secondary | ICD-10-CM | POA: Diagnosis not present

## 2020-03-26 ENCOUNTER — Other Ambulatory Visit: Payer: Self-pay

## 2020-03-26 ENCOUNTER — Telehealth: Payer: Self-pay

## 2020-03-26 NOTE — Telephone Encounter (Signed)
Telephone call to patient to schedule palliative care visit.  Patient in agreement with RN making home visit 03/27/20 at 11:30 AM.

## 2020-03-26 NOTE — Patient Outreach (Signed)
[facility]) Care Management  03/26/2020  Jasmine [name] 1946/06/03 [id number]   Telephone call to patient for follow up. No answer.  Unable to leave a message.  Plan: RN CM will attempt patient again within 4 business days.    [name], RN, MSN [place] Management Care Management Coordinator Direct Line [phone] Cell [phone] Toll Free: [phone]  Fax: [phone]

## 2020-03-27 ENCOUNTER — Telehealth: Payer: Self-pay | Admitting: Adult Health

## 2020-03-27 ENCOUNTER — Other Ambulatory Visit: Payer: Self-pay

## 2020-03-27 ENCOUNTER — Other Ambulatory Visit: Payer: Medicare HMO

## 2020-03-27 DIAGNOSIS — J449 Chronic obstructive pulmonary disease, unspecified: Secondary | ICD-10-CM | POA: Diagnosis not present

## 2020-03-27 DIAGNOSIS — M170 Bilateral primary osteoarthritis of knee: Secondary | ICD-10-CM | POA: Diagnosis not present

## 2020-03-27 DIAGNOSIS — M19042 Primary osteoarthritis, left hand: Secondary | ICD-10-CM | POA: Diagnosis not present

## 2020-03-27 DIAGNOSIS — M5416 Radiculopathy, lumbar region: Secondary | ICD-10-CM | POA: Diagnosis not present

## 2020-03-27 DIAGNOSIS — J9622 Acute and chronic respiratory failure with hypercapnia: Secondary | ICD-10-CM | POA: Diagnosis not present

## 2020-03-27 DIAGNOSIS — I110 Hypertensive heart disease with heart failure: Secondary | ICD-10-CM | POA: Diagnosis not present

## 2020-03-27 DIAGNOSIS — Z515 Encounter for palliative care: Secondary | ICD-10-CM

## 2020-03-27 DIAGNOSIS — I5032 Chronic diastolic (congestive) heart failure: Secondary | ICD-10-CM | POA: Diagnosis not present

## 2020-03-27 DIAGNOSIS — M19041 Primary osteoarthritis, right hand: Secondary | ICD-10-CM | POA: Diagnosis not present

## 2020-03-27 DIAGNOSIS — G8929 Other chronic pain: Secondary | ICD-10-CM | POA: Diagnosis not present

## 2020-03-27 MED ORDER — IPRATROPIUM-ALBUTEROL 0.5-2.5 (3) MG/3ML IN SOLN: 3.0000 mL | Freq: Four times a day (QID) | RESPIRATORY_TRACT | 11 refills | Status: DC | PRN

## 2020-03-27 NOTE — Telephone Encounter (Signed)
Instructions from last visit 6/1 with TP    Return in about 4 weeks (around 04/17/2020) for Follow up with Dr. Elsworth Soho. Begin Daliresp daily.  Continue on Spiriva 1 puff daily  Continue on BREO daily  Rinse after use.  May use Duoneb nebs every 6hr As needed  Wheezing /shortness of breath .  Continue on non-invasive Vent (TRILOGY )  At bedtime  And with naps.  Try new mask -dream wear full face.  Trilogy device download -90 days  Continue on Oxygen 3l/m rest and 4l/m with activity .  Palliative Care consult .   Follow up Dr. Elsworth Soho  In 4-6 weeks and As needed    Please contact office for sooner follow up if symptoms do not improve or worsen or seek emergency care      Called and spoke with Virgie from [facility] stating to her that TP said that pt could use Duoneb sol Q6h prn. Stated that it does not look like that Rx was sent to pt's  Pharmacy after last OV but I stated to her that I was going to send Rx to pharmacy for pt and she verbalized understanding. Nothing further needed.

## 2020-03-27 NOTE — Progress Notes (Signed)
PATIENT NAME: Jasmine [name] DOB: 1946-04-01 MRN: [id number]  PRIMARY CARE PROVIDER: Rigoberto Noel, MD  RESPONSIBLE PARTY:  Acct ID - Guarantor Home Phone Work Phone Relationship Acct Type  [id number] Jasmine [name], Jasmine [name]  Self P/F     [address] [place], [place] [address]    PLAN OF CARE and INTERVENTIONS:               1.  GOALS OF CARE/ ADVANCE CARE PLANNING:  Patient wants her health to improve and be able to remain at home.               2.  PATIENT/CAREGIVER EDUCATION:  Education on palliative care services, education on need to wear Trilogy machine to prevent CO2 buildup, education on s/s of infection, reviewed meds, support                3.  DISEASE STATUS:RN made scheduled palliative care home visit. Nurse explained palliative care services and patient in agreement. Patient lives with her ex-husband Jasmine [name]. Patient alert and oriented. Patient denies having pain at the present time but reports she does suffer with chronic lower back pain. Patient's past medical history includes but not limited to congestive heart failure, hypertension, core pulmonale, edema due to congestive heart failure, allergies, asthma, COPD, history of respiratory failure with hypoxia, hypothyroidism, hearing loss, osteoporosis, depression with anxiety, fatigue, protein calorie malnutrition, solitary pulmonary nodule, history of aspiration, history of polysubstance abuse and respiratory acidosis. Patient current weight is 111 lb and patient reports she is 5 foot tall. Patient receiving O2 via nasal cannula is 3 liters per minute. Patient reports when she walks or gets a shower O2 is increased to 4 liters per minute. Patient currently receiving [place] through [facility]. Patient reports she is receiving OT and nursing services. Patient has been using inhalers. Patient has nebulizer in home but is not doing albuterol treatments. Patient states she was informed not to do neb treatments. Patient in  agreement with nurse calling MD to clarify if patient should be doing albuterol neb treatments. Patient reports having shortness of breath on exertion. Patient has Trilogy machine but has used Trilogy as directed due to mask not fitting properly. Patient reports she has received new mask and has been able to wear the past four to five nights. Patient reports her appetite will be good one day and then she will not want to eat for a few days. Patients breath sounds are decreased with bilateral expository wheezing. Patients vital signs are stable. Patient reports she was reaching for something on nightstand and rolled off bed and hit floor over the weekend. Patient reports she did not suffer any injuries. Nurse  reviewed patient's medications with patient. Nurse encouraged patient to wear Trilogy machine to prevent CO2 buildup and hopefully prevent any more hospitalizations. Patient has had 2 hospitalizations in the past month. Palliative care services explained and patient in  Agreement with palliative care services.  Patient wishes to remain a full code. Support provided to patient. Patient encouraged to contact palliative care with questions or concerns.       HISTORY OF PRESENT ILLNESS: Patient is a 74 year old female who resides in home with her ex-husband Jasmine [name].  Patient is open to palliative care services.  Patient will be seen monthly and PRN.     CODE STATUS: Full Code  ADVANCED DIRECTIVES: MOST FORM: No PPS: 50%   PHYSICAL EXAM:   VITALS: Today's Vitals   03/27/20 1208  BP: (!) 92/58  Pulse: 78  Resp: 18  Temp: 99.4 F (37.4 C)  TempSrc: Temporal  SpO2: 90%  Weight: 111 lb 6.4 oz (50.5 kg)  Height: 5' (1.524 m)  PainSc: 0-No pain    LUNGS: decreased breath sounds, expiratory wheezes bilaterally CARDIAC: Cor RRR  EXTREMITIES: 2+ edema SKIN: Skin color, texture, turgor normal. No rashes or lesions  NEURO: positive for weakness       Nilda Simmer, RN

## 2020-03-27 NOTE — Patient Outreach (Signed)
[facility] [facility]) Care Management  [place]  03/27/2020   Jasmine [name] 12/09/1945 [id number]  Subjective: Telephone call to patient for follow up. Patient reports she is doing good.  She reports that she is preparing for palliative nurse visit at 12 noon.  Discussed palliative care and needing that extra monitoring. Patient reports being compliant with her BiPAP and inhalers.  Discussed importance of compliance and how this helps with her overall function and maintenance of her COPD. She verbalized understanding and voices no concerns.    Objective:   Encounter Medications:  Outpatient Encounter Medications as of 03/27/2020  Medication Sig  . albuterol (PROVENTIL) (2.5 MG/3ML) 0.083% nebulizer solution Take 3 mLs (2.5 mg total) by nebulization every 6 (six) hours as needed for wheezing or shortness of breath.  [name] albuterol (VENTOLIN HFA) 108 (90 Base) MCG/ACT inhaler INHALE 1 TO 2 PUFFS INTO THE LUNGS EVERY 6 (SIX) HOURS AS NEEDED FOR WHEEZING OR SHORTNESS OF BREATH. (Patient not taking: Reported on 03/20/2020)  . aspirin EC 81 MG EC tablet Take 1 tablet (81 mg total) by mouth daily.  [name] atorvastatin (LIPITOR) 10 MG tablet TAKE ONE TABLET BY MOUTH IN THE EVENING (Patient taking differently: Take 10 mg by mouth daily at 6 PM. TAKE ONE TABLET BY MOUTH IN THE EVENING)  . citalopram (CELEXA) 20 MG tablet NEW PRESCRIPTION REQUEST: CITALOPRAM 20MG - TAKE ONE TABLET BY MOUTH DAILY (Patient taking differently: Take 20 mg by mouth daily. )  . fluticasone furoate-vilanterol (BREO ELLIPTA) 200-25 MCG/INH AEPB Inhale 1 puff into the lungs daily.  . furosemide (LASIX) 40 MG tablet NEW PRESCRIPTION REQUEST: FUROSEMIDE 40MG - TAKE ONE TABLET BY MOUTH IN THE MORNING AND TAKE ONE TABLET BY MOUTH AS NEEDED IN THE EVENING (Patient taking differently: Take 40 mg by mouth 2 (two) times daily. )  . levothyroxine (SYNTHROID) 75 MCG tablet NEW PRESCRIPTION REQUEST: LEVOTHYROXINE 75MCG - TAKE ONE  TABLET BY MOUTH EARLY MORNING (Patient taking differently: Take 75 mcg by mouth daily. )  . OXYGEN Inhale 3 L into the lungs. 24/7  . potassium chloride (KLOR-CON) 10 MEQ tablet NEW PRESCRIPTION REQUEST: KLOR-CON 10MEQ - TAKE ONE TABLET BY MOUTH TWICE DAILY  . predniSONE (DELTASONE) 20 MG tablet Take 1-2 tablets (20-40 mg total) by mouth daily with breakfast. Take 40 mg daily for 2 days and then 20 mg daily for 2 days and then stop (Patient not taking: Reported on 03/20/2020)  . Roflumilast (DALIRESP) 250 MCG TABS Take 1 tablet by mouth daily.  [name] tiotropium (SPIRIVA HANDIHALER) 18 MCG inhalation capsule NEW PRESCRIPTION REQUEST: SPIRIVA 18MCG HANDIHALER - TAKE TWO INHALATIONS OF THE CONTENTS OF ONE CAPSULE BY ONCE DAILY  . traMADol (ULTRAM) 50 MG tablet Take 1 tablet (50 mg total) by mouth 2 (two) times daily as needed. (Patient taking differently: Take 50 mg by mouth 2 (two) times daily. )   No facility-administered encounter medications on file as of 03/27/2020.    Functional Status:  In your present state of health, do you have any difficulty performing the following activities: 02/07/2020 02/06/2020  Hearing? N N  Vision? N N  Difficulty concentrating or making decisions? N N  Comment - -  Walking or climbing stairs? N N  Comment - -  Dressing or bathing? N N  Doing errands, shopping? N N  Preparing Food and eating ? N N  Using the Toilet? N N  In the past six months, have you accidently leaked urine? [name]  Comment occasional incontinence wears a pantyliner  Do you have problems with loss of bowel control? N N  Managing your Medications? N N  Managing your Finances? N N  Housekeeping or managing your Housekeeping? Y Y  Comment ex-spouse assists needs assistance  Some recent data might be hidden    Fall/Depression Screening: Fall Risk  02/07/2020 02/06/2020 12/14/2019  Falls in the past year? 0 0 0  Number falls in past yr: - 0 -  Comment - - -  Injury with Fall? - 0 -  Risk Factor  Category  - - -  Risk for fall due to : - Impaired balance/gait -  Follow up - Falls evaluation completed;Education provided;Falls prevention discussed -   PHQ 2/9 Scores 02/07/2020 02/06/2020 12/14/2019 12/13/2019 11/30/2019 08/22/2019 07/29/2019  PHQ - 2 Score 0 0 0 0 0 0 0  PHQ- 9 Score - - - - - - -    Assessment: Patient continues with compliance with COPD regimen. Patient continues to manage chronic conditions and benefits from case management outreach for education and support.     Plan:  [facility] CM Care Plan Problem One     Most Recent Value  Care Plan Problem One  Knowledge deficit related to COPD  Role Documenting the Problem One  Care Management Telephonic Coordinator  Care Plan for Problem One  Active  [facility] Long Term Goal   Patient will continue to follow COPD regimen and not have any excerbations within the next 90 days.  [facility] Long Term Goal Start Date  02/07/20  Interventions for Problem One Long Term Goal  Patient utilizing bipap, inhalers, and nebulizers as ordered. Patient in green zone today.  Discussed signs of COPD exacerbation and importance of seeking medical attention.      [facility] CM Care Plan Problem Two     Most Recent Value  Care Plan Problem [facility] Admission  Role Documenting the Problem Two  Care Management [place] for Problem Two  Active  Interventions for Problem Two Long Term Goal   Patient compliant with Bipap.  Discussed importance of wearing Bipap at night and during naps.  Discussed with patient how respiratory failure episodes diminish overall function over time.    [facility] Long Term Goal  Patient will not experience unplanned hospital readmission within the next 31 days  [facility] Long Term Goal Start Date  03/06/20  [facility] CM Short Term Goal #2   Patient will report wearing bipap nightly within 14 days.    [facility] CM Short Term Goal #2 Start Date  03/06/20  [facility] CM Short Term Goal #2 Met Date  03/27/20     RN CM will contact patient again  later this month and patient agreeable.    [name], RN, MSN [place] Management Care Management Coordinator Direct Line [phone] Cell [phone] Toll Free: [phone]  Fax: [phone]

## 2020-03-28 ENCOUNTER — Telehealth: Payer: Self-pay | Admitting: Adult Health

## 2020-03-28 ENCOUNTER — Telehealth: Payer: Self-pay

## 2020-03-28 NOTE — Telephone Encounter (Signed)
Spoke with pt, she would like to know if there is a contraindication between the tramadol and duoneb. I advised her that I didn't think so but would check with TP.  Please advise.

## 2020-03-28 NOTE — Telephone Encounter (Signed)
Return telephone call from NP Tammy Parretts office.  NP states patient is to use Duoneb and her office would be calling RX for Duoneb to [place].  RN called and updated patient.

## 2020-03-29 ENCOUNTER — Emergency Department (HOSPITAL_COMMUNITY): Payer: Medicare HMO

## 2020-03-29 ENCOUNTER — Telehealth: Payer: Self-pay | Admitting: Internal Medicine

## 2020-03-29 ENCOUNTER — Emergency Department (HOSPITAL_COMMUNITY)
Admission: EM | Admit: 2020-03-29 | Discharge: 2020-03-29 | Disposition: A | Discharge status: Home / Self Care | Payer: Medicare HMO | Attending: Emergency Medicine | Admitting: Emergency Medicine

## 2020-03-29 ENCOUNTER — Other Ambulatory Visit: Payer: Self-pay

## 2020-03-29 DIAGNOSIS — Z85118 Personal history of other malignant neoplasm of bronchus and lung: Secondary | ICD-10-CM | POA: Insufficient documentation

## 2020-03-29 DIAGNOSIS — Z7982 Long term (current) use of aspirin: Secondary | ICD-10-CM | POA: Diagnosis not present

## 2020-03-29 DIAGNOSIS — R0902 Hypoxemia: Secondary | ICD-10-CM | POA: Diagnosis not present

## 2020-03-29 DIAGNOSIS — I110 Hypertensive heart disease with heart failure: Secondary | ICD-10-CM | POA: Insufficient documentation

## 2020-03-29 DIAGNOSIS — E039 Hypothyroidism, unspecified: Secondary | ICD-10-CM | POA: Diagnosis not present

## 2020-03-29 DIAGNOSIS — G8929 Other chronic pain: Secondary | ICD-10-CM | POA: Diagnosis not present

## 2020-03-29 DIAGNOSIS — I959 Hypotension, unspecified: Secondary | ICD-10-CM | POA: Diagnosis not present

## 2020-03-29 DIAGNOSIS — I5032 Chronic diastolic (congestive) heart failure: Secondary | ICD-10-CM | POA: Insufficient documentation

## 2020-03-29 DIAGNOSIS — J441 Chronic obstructive pulmonary disease with (acute) exacerbation: Secondary | ICD-10-CM | POA: Diagnosis not present

## 2020-03-29 DIAGNOSIS — R062 Wheezing: Secondary | ICD-10-CM | POA: Diagnosis not present

## 2020-03-29 DIAGNOSIS — M19041 Primary osteoarthritis, right hand: Secondary | ICD-10-CM | POA: Diagnosis not present

## 2020-03-29 DIAGNOSIS — Z79899 Other long term (current) drug therapy: Secondary | ICD-10-CM | POA: Diagnosis not present

## 2020-03-29 DIAGNOSIS — Z87891 Personal history of nicotine dependence: Secondary | ICD-10-CM | POA: Insufficient documentation

## 2020-03-29 DIAGNOSIS — M5416 Radiculopathy, lumbar region: Secondary | ICD-10-CM | POA: Diagnosis not present

## 2020-03-29 DIAGNOSIS — M170 Bilateral primary osteoarthritis of knee: Secondary | ICD-10-CM | POA: Diagnosis not present

## 2020-03-29 DIAGNOSIS — R0602 Shortness of breath: Secondary | ICD-10-CM | POA: Diagnosis not present

## 2020-03-29 DIAGNOSIS — J449 Chronic obstructive pulmonary disease, unspecified: Secondary | ICD-10-CM | POA: Diagnosis not present

## 2020-03-29 DIAGNOSIS — J9622 Acute and chronic respiratory failure with hypercapnia: Secondary | ICD-10-CM | POA: Diagnosis not present

## 2020-03-29 DIAGNOSIS — J439 Emphysema, unspecified: Secondary | ICD-10-CM | POA: Diagnosis not present

## 2020-03-29 DIAGNOSIS — M19042 Primary osteoarthritis, left hand: Secondary | ICD-10-CM | POA: Diagnosis not present

## 2020-03-29 LAB — CBC WITH DIFFERENTIAL/PLATELET
Abs Immature Granulocytes: 0.01 10*3/uL (ref 0.00–0.07)
Basophils Absolute: 0 10*3/uL (ref 0.0–0.1)
Basophils Relative: 1 %
Eosinophils Absolute: 0.2 10*3/uL (ref 0.0–0.5)
Eosinophils Relative: 5 %
HCT: 42.3 % (ref 36.0–46.0)
Hemoglobin: 12.5 g/dL (ref 12.0–15.0)
Immature Granulocytes: 0 %
Lymphocytes Relative: 26 %
Lymphs Abs: 1.2 10*3/uL (ref 0.7–4.0)
MCH: 31.7 pg (ref 26.0–34.0)
MCHC: 29.6 g/dL — ABNORMAL LOW (ref 30.0–36.0)
MCV: 107.4 fL — ABNORMAL HIGH (ref 80.0–100.0)
Monocytes Absolute: 0.7 10*3/uL (ref 0.1–1.0)
Monocytes Relative: 15 %
Neutro Abs: 2.4 10*3/uL (ref 1.7–7.7)
Neutrophils Relative %: 53 %
Platelets: 132 10*3/uL — ABNORMAL LOW (ref 150–400)
RBC: 3.94 MIL/uL (ref 3.87–5.11)
RDW: 14.2 % (ref 11.5–15.5)
WBC: 4.5 10*3/uL (ref 4.0–10.5)
nRBC: 0 % (ref 0.0–0.2)

## 2020-03-29 LAB — COMPREHENSIVE METABOLIC PANEL
ALT: 20 U/L (ref 0–44)
AST: 30 U/L (ref 15–41)
Albumin: 3.7 g/dL (ref 3.5–5.0)
Alkaline Phosphatase: 44 U/L (ref 38–126)
Anion gap: 10 (ref 5–15)
BUN: 9 mg/dL (ref 8–23)
CO2: 42 mmol/L — ABNORMAL HIGH (ref 22–32)
Calcium: 9.8 mg/dL (ref 8.9–10.3)
Chloride: 90 mmol/L — ABNORMAL LOW (ref 98–111)
Creatinine, Ser: 0.73 mg/dL (ref 0.44–1.00)
GFR calc Af Amer: >60 mL/min (ref >60)
GFR calc non Af Amer: >60 mL/min (ref >60)
Glucose, Bld: 125 mg/dL — ABNORMAL HIGH (ref 70–99)
Potassium: 4 mmol/L (ref 3.5–5.1)
Sodium: 142 mmol/L (ref 135–145)
Total Bilirubin: 1.2 mg/dL (ref 0.3–1.2)
Total Protein: 6.6 g/dL (ref 6.5–8.1)

## 2020-03-29 MED ORDER — METHYLPREDNISOLONE SODIUM SUCC 125 MG IJ SOLR
125.0000 mg | Freq: Once | INTRAMUSCULAR | Status: AC
  Administered 2020-03-29: 125 mg via INTRAVENOUS
  Filled 2020-03-29: qty 2

## 2020-03-29 MED ORDER — PREDNISONE 10 MG PO TABS
20.0000 mg | ORAL_TABLET | Freq: Every day | ORAL | 0 refills | Status: DC
Start: 2020-03-29 — End: 2020-04-19

## 2020-03-29 MED ORDER — ALBUTEROL SULFATE HFA 108 (90 BASE) MCG/ACT IN AERS
2.0000 | INHALATION_SPRAY | Freq: Once | RESPIRATORY_TRACT | Status: AC
  Administered 2020-03-29: 2 via RESPIRATORY_TRACT
  Filled 2020-03-29: qty 6.7

## 2020-03-29 MED ORDER — PREDNISONE 20 MG PO TABS
60.0000 mg | ORAL_TABLET | Freq: Once | ORAL | Status: AC
  Administered 2020-03-29: 60 mg via ORAL
  Filled 2020-03-29: qty 3

## 2020-03-29 NOTE — ED Triage Notes (Signed)
Per [facility] pt is from home. She reports SOB due to using her bi-pap at night. She states the mask she is using isn't adequate enough. She received a NEB with home health prior to [facility] arriving. She also reports a 7 pound weight gain within the past week. She has a history of CHF and COPD.  107/58 74 HR 95% 4L 98.1 Temp

## 2020-03-29 NOTE — Telephone Encounter (Signed)
ATC pt, call went straight to VM but it was full so I could not leave a message. Will try back.

## 2020-03-29 NOTE — Telephone Encounter (Signed)
Needs to go to ED now °

## 2020-03-29 NOTE — Telephone Encounter (Signed)
Jasmine [name] called and was reporting a decline in the patients O2 saturation and it is 82%. She was sitting at rest with 3 litters of O2 on.

## 2020-03-29 NOTE — ED Notes (Signed)
Help get patient undress on the monitor did ekg shown to Dr Roderic Palau patient is resting with nuse at bedside and call bell in reach

## 2020-03-29 NOTE — ED Provider Notes (Signed)
[facility] EMERGENCY DEPARTMENT Provider Note   CSN: [id number] Arrival date & time: 03/29/20  1453     History Chief Complaint  Patient presents with  . Shortness of Breath    Jasmine [name] is a 74 y.o. female.  Patient complains of shortness of breath.  Patient has a history of COPD.  The history is provided by the patient. No language interpreter was used.  Shortness of Breath Severity:  Mild Onset quality:  Sudden Timing:  Intermittent Progression:  Waxing and waning Chronicity:  Recurrent Context: activity   Relieved by:  Nothing Worsened by:  Nothing Ineffective treatments:  None tried Associated symptoms: no abdominal pain, no chest pain, no cough, no headaches and no rash        Past Medical History:  Diagnosis Date  . ALLERGIC RHINITIS 05/01/2007  . Anxiety   . Arthritis    "hands" (12/28/2015)  . ASTHMA 05/01/2007   "since I was a child"  . Cancer (HCC)    LUNG  . CHF (congestive heart failure) ([place])   . Chronic bronchitis ([place])   . COPD (chronic obstructive pulmonary disease) ([place])   . DEPRESSION 07/21/2008  . DVT (deep venous thrombosis) (HCC) 1960s   LLE  . FATIGUE 10/10/2010  . HYPERTENSION 05/01/2007  . HYPOTHYROIDISM 08/23/2009   s/p RAI  . LUMBAR RADICULOPATHY, RIGHT 08/25/2008  . On home oxygen therapy    "3-4L; qd; all the time" (12/28/2015)  . OSTEOPOROSIS 05/01/2007  . SHOULDER PAIN, LEFT 08/23/2009  . SINUSITIS, CHRONIC 10/10/2010    Patient Active Problem List   Diagnosis Date Noted  . Acute on chronic respiratory failure with hypercapnia ([place]) 01/29/2020  . Malnutrition of moderate degree 08/16/2019  . Type II respiratory failure ([place]) 08/14/2019  . Respiratory acidosis 03/07/2019  . Goals of care, counseling/discussion 09/20/2018  . Edema due to congestive heart failure ([place]) 07/27/2018  . Palliative care by specialist   . Shortness of breath   . Chronic low back pain 04/30/2018  . COPD with acute  exacerbation ([place])   . Hypothyroidism   . Anxiety   . Respiratory failure with hypercapnia ([place]) 03/24/2018  . Hypercarbia 03/22/2018  . Polysubstance abuse ([place]) 01/26/2018  . Acute on chronic respiratory failure with hypoxia and hypercapnia ([place]) 01/07/2018  . Aspiration into airway 06/03/2017  . Rash 07/12/2016  . Bilateral hearing loss 06/05/2016  . Hyperglycemia 05/21/2016  . Abnormal LFTs 03/24/2016  . Elevated blood sugar 03/24/2016  . Solitary pulmonary nodule 01/09/2016  . Chronic respiratory failure with hypoxia ([place])   . On home oxygen therapy   . Chronic diastolic congestive heart failure ([place])   . Bacteremia Step viridans 12/27/2015  . Tobacco abuse disorder 12/25/2015  . Hypothyroidism following radioiodine therapy 09/26/2014  . Protein-calorie malnutrition, severe ([place]) 06/23/2014  . Cor pulmonale ([place]) 01/12/2013  . COPD (chronic obstructive pulmonary disease) ([place]) 11/29/2011  . Macrocytosis without anemia 11/09/2011  . Palliative care encounter 04/09/2011  . SINUSITIS, CHRONIC 10/10/2010  . FATIGUE 10/10/2010  . Depression with anxiety 07/21/2008  . Essential hypertension 05/01/2007  . ALLERGIC RHINITIS 05/01/2007  . Asthma 05/01/2007  . OSTEOPOROSIS 05/01/2007    Past Surgical History:  Procedure Laterality Date  . APPENDECTOMY    . DILATION AND CURETTAGE OF UTERUS  multiple   history of multiple dialations and curettages and miscarriages, unfortunately never carrying a child to term  . ECTOPIC PREGNANCY SURGERY  "late '60s or early '70s  . ELECTROCARDIOGRAM  06/20/2006  .  OOPHORECTOMY Right      OB History    Gravida  5   Para      Term      Preterm      AB  5   Living        SAB  4   TAB      Ectopic  1   Multiple      Live Births              Family History  Problem Relation Age of Onset  . Lung cancer Father   . Alcohol abuse Brother   . Diabetes Brother   . Hypertension Other   . Stroke Mother   . Thyroid disease  Mother   . Asthma Other        maternal aunts  . Breast cancer Neg Hx     Social History   Tobacco Use  . Smoking status: Former Smoker    Packs/day: 1.00    Years: 52.00    Pack years: 52.00    Types: Cigarettes    Start date: 1965    Quit date: 07/20/2016    Years since quitting: 3.6  . Smokeless tobacco: Never Used  Vaping Use  . Vaping Use: Never used  Substance Use Topics  . Alcohol use: Not Currently  . Drug use: Not Currently    Home Medications Prior to Admission medications   Medication Sig Start Date End Date Taking? Authorizing Provider  albuterol (PROVENTIL) (2.5 MG/3ML) 0.083% nebulizer solution Take 3 mLs (2.5 mg total) by nebulization every 6 (six) hours as needed for wheezing or shortness of breath. 11/30/19   Biagio Borg, MD  albuterol (VENTOLIN HFA) 108 (90 Base) MCG/ACT inhaler INHALE 1 TO 2 PUFFS INTO THE LUNGS EVERY 6 (SIX) HOURS AS NEEDED FOR WHEEZING OR SHORTNESS OF BREATH. Patient not taking: Reported on 03/20/2020 11/30/19   Biagio Borg, MD  aspirin EC 81 MG EC tablet Take 1 tablet (81 mg total) by mouth daily. 03/27/16   Velvet Bathe, MD  atorvastatin (LIPITOR) 10 MG tablet TAKE ONE TABLET BY MOUTH IN THE EVENING Patient taking differently: Take 10 mg by mouth daily at 6 PM. TAKE ONE TABLET BY MOUTH IN THE EVENING 12/13/19   Biagio Borg, MD  citalopram (CELEXA) 20 MG tablet NEW PRESCRIPTION REQUEST: CITALOPRAM 20MG  - TAKE ONE TABLET BY MOUTH DAILY Patient taking differently: Take 20 mg by mouth daily.  11/30/19   Biagio Borg, MD  fluticasone furoate-vilanterol (BREO ELLIPTA) 200-25 MCG/INH AEPB Inhale 1 puff into the lungs daily. 11/30/19   Biagio Borg, MD  furosemide (LASIX) 40 MG tablet NEW PRESCRIPTION REQUEST: FUROSEMIDE 40MG  - TAKE ONE TABLET BY MOUTH IN THE MORNING AND TAKE ONE TABLET BY MOUTH AS NEEDED IN THE EVENING Patient taking differently: Take 40 mg by mouth 2 (two) times daily.  11/30/19   Biagio Borg, MD  ipratropium-albuterol (DUONEB)  0.5-2.5 (3) MG/3ML SOLN Take 3 mLs by nebulization every 6 (six) hours as needed. 03/27/20   Parrett, Fonnie Mu, NP  levothyroxine (SYNTHROID) 75 MCG tablet NEW PRESCRIPTION REQUEST: LEVOTHYROXINE 75MCG - TAKE ONE TABLET BY MOUTH EARLY MORNING Patient taking differently: Take 75 mcg by mouth daily.  11/30/19   Biagio Borg, MD  OXYGEN Inhale 3 L into the lungs. 24/7    [provider]  potassium chloride (KLOR-CON) 10 MEQ tablet NEW PRESCRIPTION REQUEST: KLOR-CON 10MEQ - TAKE ONE TABLET BY MOUTH TWICE DAILY 11/30/19  Biagio Borg, MD  predniSONE (DELTASONE) 20 MG tablet Take 1-2 tablets (20-40 mg total) by mouth daily with breakfast. Take 40 mg daily for 2 days and then 20 mg daily for 2 days and then stop Patient not taking: Reported on 03/20/2020 03/05/20   Domenic Polite, MD  Roflumilast (DALIRESP) 250 MCG TABS Take 1 tablet by mouth daily. 03/20/20   Parrett, Fonnie Mu, NP  tiotropium (SPIRIVA HANDIHALER) 18 MCG inhalation capsule NEW PRESCRIPTION REQUEST: SPIRIVA 18MCG HANDIHALER - TAKE TWO INHALATIONS OF THE CONTENTS OF ONE CAPSULE BY ONCE DAILY 11/30/19   Biagio Borg, MD  traMADol (ULTRAM) 50 MG tablet Take 1 tablet (50 mg total) by mouth 2 (two) times daily as needed. Patient taking differently: Take 50 mg by mouth 2 (two) times daily.  11/30/19   Biagio Borg, MD    Allergies    Alendronate sodium  Review of Systems   Review of Systems  Constitutional: Negative for appetite change and fatigue.  HENT: Negative for congestion, ear discharge and sinus pressure.   Eyes: Negative for discharge.  Respiratory: Positive for shortness of breath. Negative for cough.   Cardiovascular: Negative for chest pain.  Gastrointestinal: Negative for abdominal pain and diarrhea.  Genitourinary: Negative for frequency and hematuria.  Musculoskeletal: Negative for back pain.  Skin: Negative for rash.  Neurological: Negative for seizures and headaches.  Psychiatric/Behavioral: Negative for  hallucinations.    Physical Exam Updated Vital Signs BP (!) 104/58   Pulse 87   Temp 99.4 F (37.4 C) (Oral)   Resp 18   SpO2 100%   Physical Exam Vitals and nursing note reviewed.  Constitutional:      Appearance: She is well-developed.  HENT:     Head: Normocephalic.     Nose: Nose normal.  Eyes:     General: No scleral icterus.    Conjunctiva/sclera: Conjunctivae normal.  Neck:     Thyroid: No thyromegaly.  Cardiovascular:     Rate and Rhythm: Normal rate and regular rhythm.     Heart sounds: No murmur heard.  No friction rub. No gallop.   Pulmonary:     Breath sounds: No stridor. Wheezing present. No rales.  Chest:     Chest wall: No tenderness.  Abdominal:     General: There is no distension.     Tenderness: There is no abdominal tenderness. There is no rebound.  Musculoskeletal:        General: Normal range of motion.     Cervical back: Neck supple.  Lymphadenopathy:     Cervical: No cervical adenopathy.  Skin:    Findings: No erythema or rash.  Neurological:     Mental Status: She is alert and oriented to person, place, and time.     Motor: No abnormal muscle tone.     Coordination: Coordination normal.  Psychiatric:        Behavior: Behavior normal.     ED Results / Procedures / Treatments   Labs (all labs ordered are listed, but only abnormal results are displayed) Labs Reviewed  CBC WITH DIFFERENTIAL/PLATELET - Abnormal; Notable for the following components:      Result Value   MCV 107.4 (*)    MCHC 29.6 (*)    Platelets 132 (*)    All other components within normal limits  COMPREHENSIVE METABOLIC PANEL    EKG None  Radiology DG Chest Port 1 View  Result Date: 03/29/2020 CLINICAL DATA:  Shortness of breath EXAM: PORTABLE CHEST 1  VIEW COMPARISON:  03/15/2020 FINDINGS: Stable cardiomediastinal contours. Atherosclerotic calcification of the aortic knob. Hyperexpanded lungs. Chronic opacity within the inferior aspect of the right upper lobe.  No new airspace consolidation. No pleural effusion or pneumothorax. IMPRESSION: 1. Chronic opacity within the inferior aspect of the right upper lobe, likely scarring. No new airspace consolidation. 2. Emphysema. Electronically Signed   By: Davina Poke D.O.   On: 03/29/2020 15:59    Procedures Procedures (including critical care time)  Medications Ordered in ED Medications  predniSONE (DELTASONE) tablet 60 mg (has no administration in time range)  methylPREDNISolone sodium succinate (SOLU-MEDROL) 125 mg/2 mL injection 125 mg (125 mg Intravenous Given 03/29/20 1541)  albuterol (VENTOLIN HFA) 108 (90 Base) MCG/ACT inhaler 2 puff (2 puffs Inhalation Given 03/29/20 1541)    ED Course  I have reviewed the triage vital signs and the nursing notes.  Pertinent labs & imaging results that were available during my care of the patient were reviewed by me and considered in my medical decision making (see chart for details).    MDM Rules/Calculators/A&P                          Patient with mild exacerbation of COPD.  She will be placed on prednisone and will follow up with her pulmonary specialist        This patient presents to the ED for concern of shortness of breath this involves an extensive number of treatment options, and is a complaint that carries with it a high risk of complications and morbidity.  The differential diagnosis includes COPD   Lab Tests:   I Ordered, reviewed, and interpreted labs, which included CBC unremarkable  Medicines ordered:   I ordered medication prednisone albuterol  Imaging Studies ordered:   I ordered imaging studies which included chest x-ray and  I independently visualized and interpreted imaging which showed no acute disease  Additional history obtained:   Additional history obtained from records  Previous records obtained and reviewed   Consultations Obtained:   Reevaluation:  After the interventions stated above, I  reevaluated the patient and found improved  Critical Interventions:  .   Final Clinical Impression(s) / ED Diagnoses Final diagnoses:  None    Rx / DC Orders ED Discharge Orders    None       Milton Ferguson, MD 03/29/20 1627

## 2020-03-29 NOTE — Telephone Encounter (Signed)
Not that I know of .

## 2020-03-29 NOTE — Discharge Instructions (Addendum)
Follow-up with your pulmonologist next week and return if any problems

## 2020-03-30 NOTE — Telephone Encounter (Signed)
Pt went to the ED yesterday.

## 2020-03-30 NOTE — Telephone Encounter (Signed)
ATC pt, there was no answer and her VM was full so I could not leave a message. Will try back.

## 2020-04-02 DIAGNOSIS — G8929 Other chronic pain: Secondary | ICD-10-CM | POA: Diagnosis not present

## 2020-04-02 DIAGNOSIS — I5032 Chronic diastolic (congestive) heart failure: Secondary | ICD-10-CM | POA: Diagnosis not present

## 2020-04-02 DIAGNOSIS — M19041 Primary osteoarthritis, right hand: Secondary | ICD-10-CM | POA: Diagnosis not present

## 2020-04-02 DIAGNOSIS — B9561 Methicillin susceptible Staphylococcus aureus infection as the cause of diseases classified elsewhere: Secondary | ICD-10-CM | POA: Diagnosis not present

## 2020-04-02 DIAGNOSIS — J9622 Acute and chronic respiratory failure with hypercapnia: Secondary | ICD-10-CM | POA: Diagnosis not present

## 2020-04-02 DIAGNOSIS — I110 Hypertensive heart disease with heart failure: Secondary | ICD-10-CM | POA: Diagnosis not present

## 2020-04-02 DIAGNOSIS — R7881 Bacteremia: Secondary | ICD-10-CM | POA: Diagnosis not present

## 2020-04-02 DIAGNOSIS — J9612 Chronic respiratory failure with hypercapnia: Secondary | ICD-10-CM | POA: Diagnosis not present

## 2020-04-02 DIAGNOSIS — J449 Chronic obstructive pulmonary disease, unspecified: Secondary | ICD-10-CM | POA: Diagnosis not present

## 2020-04-02 DIAGNOSIS — M5416 Radiculopathy, lumbar region: Secondary | ICD-10-CM | POA: Diagnosis not present

## 2020-04-02 DIAGNOSIS — M170 Bilateral primary osteoarthritis of knee: Secondary | ICD-10-CM | POA: Diagnosis not present

## 2020-04-02 DIAGNOSIS — M19042 Primary osteoarthritis, left hand: Secondary | ICD-10-CM | POA: Diagnosis not present

## 2020-04-02 NOTE — Telephone Encounter (Signed)
ATC pt, there was no answer and her VM was full so I could not leave a message. We have attempted to contact pt several times with no success or call back from pt. Per triage protocol, message will be closed.

## 2020-04-05 DIAGNOSIS — I110 Hypertensive heart disease with heart failure: Secondary | ICD-10-CM | POA: Diagnosis not present

## 2020-04-05 DIAGNOSIS — R911 Solitary pulmonary nodule: Secondary | ICD-10-CM | POA: Diagnosis not present

## 2020-04-05 DIAGNOSIS — J9611 Chronic respiratory failure with hypoxia: Secondary | ICD-10-CM | POA: Diagnosis not present

## 2020-04-05 DIAGNOSIS — J9622 Acute and chronic respiratory failure with hypercapnia: Secondary | ICD-10-CM | POA: Diagnosis not present

## 2020-04-05 DIAGNOSIS — I5032 Chronic diastolic (congestive) heart failure: Secondary | ICD-10-CM | POA: Diagnosis not present

## 2020-04-05 DIAGNOSIS — M170 Bilateral primary osteoarthritis of knee: Secondary | ICD-10-CM | POA: Diagnosis not present

## 2020-04-05 DIAGNOSIS — M5416 Radiculopathy, lumbar region: Secondary | ICD-10-CM | POA: Diagnosis not present

## 2020-04-05 DIAGNOSIS — M19041 Primary osteoarthritis, right hand: Secondary | ICD-10-CM | POA: Diagnosis not present

## 2020-04-05 DIAGNOSIS — J449 Chronic obstructive pulmonary disease, unspecified: Secondary | ICD-10-CM | POA: Diagnosis not present

## 2020-04-05 DIAGNOSIS — G8929 Other chronic pain: Secondary | ICD-10-CM | POA: Diagnosis not present

## 2020-04-05 DIAGNOSIS — M19042 Primary osteoarthritis, left hand: Secondary | ICD-10-CM | POA: Diagnosis not present

## 2020-04-05 DIAGNOSIS — R300 Dysuria: Secondary | ICD-10-CM | POA: Diagnosis not present

## 2020-04-06 ENCOUNTER — Telehealth: Payer: Self-pay | Admitting: Pulmonary Disease

## 2020-04-06 NOTE — Telephone Encounter (Signed)
ATC pt, there was no answer and no option to leave a message. Will try back. 

## 2020-04-09 DIAGNOSIS — G8929 Other chronic pain: Secondary | ICD-10-CM | POA: Diagnosis not present

## 2020-04-09 DIAGNOSIS — M19041 Primary osteoarthritis, right hand: Secondary | ICD-10-CM | POA: Diagnosis not present

## 2020-04-09 DIAGNOSIS — I5032 Chronic diastolic (congestive) heart failure: Secondary | ICD-10-CM | POA: Diagnosis not present

## 2020-04-09 DIAGNOSIS — J449 Chronic obstructive pulmonary disease, unspecified: Secondary | ICD-10-CM | POA: Diagnosis not present

## 2020-04-09 DIAGNOSIS — I110 Hypertensive heart disease with heart failure: Secondary | ICD-10-CM | POA: Diagnosis not present

## 2020-04-09 DIAGNOSIS — J9622 Acute and chronic respiratory failure with hypercapnia: Secondary | ICD-10-CM | POA: Diagnosis not present

## 2020-04-09 DIAGNOSIS — M19042 Primary osteoarthritis, left hand: Secondary | ICD-10-CM | POA: Diagnosis not present

## 2020-04-09 DIAGNOSIS — M170 Bilateral primary osteoarthritis of knee: Secondary | ICD-10-CM | POA: Diagnosis not present

## 2020-04-09 DIAGNOSIS — M5416 Radiculopathy, lumbar region: Secondary | ICD-10-CM | POA: Diagnosis not present

## 2020-04-09 NOTE — Telephone Encounter (Signed)
ATC patient X2 unable to leave vm due to inbox being full.

## 2020-04-10 ENCOUNTER — Telehealth: Payer: Self-pay | Admitting: Pulmonary Disease

## 2020-04-10 DIAGNOSIS — J45909 Unspecified asthma, uncomplicated: Secondary | ICD-10-CM

## 2020-04-10 DIAGNOSIS — M19042 Primary osteoarthritis, left hand: Secondary | ICD-10-CM | POA: Diagnosis not present

## 2020-04-10 DIAGNOSIS — I110 Hypertensive heart disease with heart failure: Secondary | ICD-10-CM | POA: Diagnosis not present

## 2020-04-10 DIAGNOSIS — G8929 Other chronic pain: Secondary | ICD-10-CM | POA: Diagnosis not present

## 2020-04-10 DIAGNOSIS — M170 Bilateral primary osteoarthritis of knee: Secondary | ICD-10-CM | POA: Diagnosis not present

## 2020-04-10 DIAGNOSIS — M5416 Radiculopathy, lumbar region: Secondary | ICD-10-CM | POA: Diagnosis not present

## 2020-04-10 DIAGNOSIS — J449 Chronic obstructive pulmonary disease, unspecified: Secondary | ICD-10-CM | POA: Diagnosis not present

## 2020-04-10 DIAGNOSIS — I5032 Chronic diastolic (congestive) heart failure: Secondary | ICD-10-CM | POA: Diagnosis not present

## 2020-04-10 DIAGNOSIS — J9622 Acute and chronic respiratory failure with hypercapnia: Secondary | ICD-10-CM | POA: Diagnosis not present

## 2020-04-10 DIAGNOSIS — M19041 Primary osteoarthritis, right hand: Secondary | ICD-10-CM | POA: Diagnosis not present

## 2020-04-10 NOTE — Telephone Encounter (Signed)
ATC pt, there was no answer and her VM is still full. We have attempted to contact pt several times with no success or call back from pt. Per triage protocol, message will be closed.

## 2020-04-10 NOTE — Telephone Encounter (Signed)
Home health nurse called because patient's nebulizer is not working. New order sent to [employer]. Patient notified.

## 2020-04-12 DIAGNOSIS — J449 Chronic obstructive pulmonary disease, unspecified: Secondary | ICD-10-CM | POA: Diagnosis not present

## 2020-04-12 DIAGNOSIS — J45909 Unspecified asthma, uncomplicated: Secondary | ICD-10-CM | POA: Diagnosis not present

## 2020-04-16 ENCOUNTER — Other Ambulatory Visit: Payer: Self-pay
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
## 2020-04-17 ENCOUNTER — Ambulatory Visit: Payer: Self-pay

## 2020-04-18 ENCOUNTER — Telehealth: Payer: Self-pay
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
## 2020-04-19 ENCOUNTER — Ambulatory Visit (INDEPENDENT_AMBULATORY_CARE_PROVIDER_SITE_OTHER): Payer: Medicare HMO | Admitting: Pulmonary Disease

## 2020-04-19 ENCOUNTER — Encounter: Payer: Self-pay | Admitting: Pulmonary Disease

## 2020-04-19 ENCOUNTER — Other Ambulatory Visit: Payer: Self-pay

## 2020-04-19 DIAGNOSIS — J441 Chronic obstructive pulmonary disease with (acute) exacerbation: Secondary | ICD-10-CM

## 2020-04-19 DIAGNOSIS — J9611 Chronic respiratory failure with hypoxia: Secondary | ICD-10-CM | POA: Diagnosis not present

## 2020-04-19 MED ORDER — TRELEGY ELLIPTA 200-62.5-25 MCG/INH IN AEPB: 1.0000 | INHALATION_SPRAY | Freq: Every day | RESPIRATORY_TRACT | 0 refills | Status: DC

## 2020-04-19 MED ORDER — PREDNISONE 10 MG PO TABS
10.0000 mg | ORAL_TABLET | Freq: Every day | ORAL | 1 refills | Status: DC
Start: 2020-04-19 — End: 2020-08-21

## 2020-04-19 NOTE — Addendum Note (Signed)
Addended by: Satira Sark D on: 04/19/2020 03:45 PM   Modules accepted: Orders

## 2020-04-19 NOTE — Assessment & Plan Note (Addendum)
3L pulse O2 does not seem to be enough for her Ambulatory saturation >> desaturated on 4 L pulse, saturation seem to stay 98% on 5 L pulse. I wonder if her breath is not strong enough for the time for pulse O2  Check download to confirm that she is compliant with NIV during sleep

## 2020-04-19 NOTE — Patient Instructions (Signed)
Obtain BiPAP download. Ambulatory saturation to decide what dose of pulse O2 you need.  Prescription for Trelegy 200 -1 puff daily, this will take the place of Breo and Spiriva  Use albuterol nebs as needed. Stay on [place]. Prescription for prednisone 10 mg daily #60 x 1 refill

## 2020-04-19 NOTE — Progress Notes (Signed)
   Subjective:    Patient ID: Jasmine [name], female    DOB: September 02, 1946, 74 y.o.   MRN: [id number]  HPI  89-yo ex- smoker followed for very severe COPD, chronic hypoxic and hypercarbic respiratory failure and lung nodules.  She is on nocturnal trilogy device. She has been oxygen dependent since March 2014.  She is maintained on 3 L of oxygen at rest and 4 L of oxygen with activity History of polysubstance abuse with cocaine  On last OV , started daliresp ED visits 5/27, 6/10 HOsp 5/13 , 4/11  ABG show acute on chronic hypercarbic respiratory failure, compensated state is 7.37/80 range.  She arrives in 3 L pulse with low O2 saturation today, improved with 3 L continuous to 96% Reports compliance with Breo and Spiriva Has completed last prednisone taper given by ED, compliant with Daliresp. States compliant with NIV has oxygen blended in  Chest x-ray 6/10 personally reviewed shows right upper lobe opacity, likely scarring, hyperinflation  Significant tests/ events reviewed   CT chest 10/2011 >>showed Moderate centrilobular emphysema. Small bilateral pleural effusions with dependent atelectasis  CT chest 07/2016-resolving right upper lobe consolidation,spiculated subcentimeter nodules that have been noted before with low-grade PET hypermetabolism CT chest February 2019 stable right upper lobe scarring, stable bilateral pulmonary nodules, emphysema  CT chest 11/2018>>stable nodules, unchanged bandlike scarring in the inferior right upper lobe stable  PFT >FEV1 at 0.47 L( 30%), ratio 39 , No BD response (<200cc)  Review of Systems Patient denies significant dyspnea,cough, hemoptysis,  chest pain, palpitations, pedal edema, orthopnea, paroxysmal nocturnal dyspnea, lightheadedness, nausea, vomiting, abdominal or  leg pains      Objective:   Physical Exam   Gen. Pleasant, elderly,thin, in no distress ENT - no thrush, no pallor/icterus,no post nasal drip Neck: No JVD, no  thyromegaly, no carotid bruits Lungs: no use of accessory muscles, no dullness to percussion, decreased BL without rales or rhonchi  Cardiovascular: Rhythm regular, heart sounds  normal, no murmurs or gallops, no peripheral edema Musculoskeletal: No deformities, no cyanosis or clubbing         Assessment & Plan:

## 2020-04-19 NOTE — Assessment & Plan Note (Signed)
Prescription for Trelegy 200 -1 puff daily, this will take the place of Breo and Spiriva Hopefully this will help with compliance  Use albuterol nebs as needed. Stay on [place].  Given recurrent hospital visits, we will keep her on low-dose prednisone for the next 2 to 3 months Prescription for prednisone 10 mg daily #60 x 1 refill

## 2020-04-20 ENCOUNTER — Telehealth: Payer: Self-pay

## 2020-04-20 NOTE — Telephone Encounter (Signed)
Unfortunately I dont see a request or letter for regarding power for her living situation

## 2020-04-20 NOTE — Telephone Encounter (Signed)
Sent to [name] to advise about the letter for her power.

## 2020-04-20 NOTE — Telephone Encounter (Signed)
New message    The patient cancels her home health visit today.   Home health will try next week wanted to let the MD know.   The patient nail was done yesterday.   The patient hair was done the day before.

## 2020-04-24 ENCOUNTER — Telehealth: Payer: Self-pay

## 2020-04-24 DIAGNOSIS — I5032 Chronic diastolic (congestive) heart failure: Secondary | ICD-10-CM | POA: Diagnosis not present

## 2020-04-24 DIAGNOSIS — M19041 Primary osteoarthritis, right hand: Secondary | ICD-10-CM | POA: Diagnosis not present

## 2020-04-24 DIAGNOSIS — G8929 Other chronic pain: Secondary | ICD-10-CM | POA: Diagnosis not present

## 2020-04-24 DIAGNOSIS — J9622 Acute and chronic respiratory failure with hypercapnia: Secondary | ICD-10-CM | POA: Diagnosis not present

## 2020-04-24 DIAGNOSIS — M170 Bilateral primary osteoarthritis of knee: Secondary | ICD-10-CM | POA: Diagnosis not present

## 2020-04-24 DIAGNOSIS — I110 Hypertensive heart disease with heart failure: Secondary | ICD-10-CM | POA: Diagnosis not present

## 2020-04-24 DIAGNOSIS — M19042 Primary osteoarthritis, left hand: Secondary | ICD-10-CM | POA: Diagnosis not present

## 2020-04-24 DIAGNOSIS — J449 Chronic obstructive pulmonary disease, unspecified: Secondary | ICD-10-CM | POA: Diagnosis not present

## 2020-04-24 DIAGNOSIS — M5416 Radiculopathy, lumbar region: Secondary | ICD-10-CM | POA: Diagnosis not present

## 2020-04-24 NOTE — Telephone Encounter (Signed)
S: This RN rec'd call from [facility] RN reporting that upon arrival pt noted to have O2 sat 64% & tachycardia at 115bpm despite O2 at 3L/min; pt also reports increased SOB for several days. Pt weighs 111.4lbs today  B: pt has history of CHF & SOB.  Parameters for wt are 112-116lbs.  Has an albuterol inhaler perscribed as well as meds that were given trelegy & ipratropium.  A: O2 increased to 4L/min & Trelegy & ipratropium given.  After tx, O2 sat 82%, P 96bpm.  [facility] RN reports no chest pain, lungs clear bilat, no evidence of resp distress. Dr Jenny Reichmann made aware call.  R: MD recommends that pt got to ER secondary to O2 sat remaining low despite interventions; may not be heart related & could be different concern.  Angel called back & given MD recommendation.  Pt refuses to go to ER at this time.  RN states she will educated pt on signs of resp distress & advise pt to call 911 for changes.

## 2020-04-24 NOTE — Telephone Encounter (Signed)
Tried to contact pt. However, pt vm box I full and I am unable to leave a vm for pt.

## 2020-05-01 DIAGNOSIS — I5032 Chronic diastolic (congestive) heart failure: Secondary | ICD-10-CM | POA: Diagnosis not present

## 2020-05-01 DIAGNOSIS — M170 Bilateral primary osteoarthritis of knee: Secondary | ICD-10-CM | POA: Diagnosis not present

## 2020-05-01 DIAGNOSIS — J449 Chronic obstructive pulmonary disease, unspecified: Secondary | ICD-10-CM | POA: Diagnosis not present

## 2020-05-01 DIAGNOSIS — J9622 Acute and chronic respiratory failure with hypercapnia: Secondary | ICD-10-CM | POA: Diagnosis not present

## 2020-05-01 DIAGNOSIS — I110 Hypertensive heart disease with heart failure: Secondary | ICD-10-CM | POA: Diagnosis not present

## 2020-05-01 DIAGNOSIS — G8929 Other chronic pain: Secondary | ICD-10-CM | POA: Diagnosis not present

## 2020-05-01 DIAGNOSIS — M5416 Radiculopathy, lumbar region: Secondary | ICD-10-CM | POA: Diagnosis not present

## 2020-05-01 DIAGNOSIS — M19041 Primary osteoarthritis, right hand: Secondary | ICD-10-CM | POA: Diagnosis not present

## 2020-05-01 DIAGNOSIS — M19042 Primary osteoarthritis, left hand: Secondary | ICD-10-CM | POA: Diagnosis not present

## 2020-05-02 DIAGNOSIS — R7881 Bacteremia: Secondary | ICD-10-CM | POA: Diagnosis not present

## 2020-05-02 DIAGNOSIS — J9612 Chronic respiratory failure with hypercapnia: Secondary | ICD-10-CM | POA: Diagnosis not present

## 2020-05-02 DIAGNOSIS — J449 Chronic obstructive pulmonary disease, unspecified: Secondary | ICD-10-CM | POA: Diagnosis not present

## 2020-05-02 DIAGNOSIS — B9561 Methicillin susceptible Staphylococcus aureus infection as the cause of diseases classified elsewhere: Secondary | ICD-10-CM | POA: Diagnosis not present

## 2020-05-05 DIAGNOSIS — J9611 Chronic respiratory failure with hypoxia: Secondary | ICD-10-CM | POA: Diagnosis not present

## 2020-05-05 DIAGNOSIS — R300 Dysuria: Secondary | ICD-10-CM | POA: Diagnosis not present

## 2020-05-05 DIAGNOSIS — R911 Solitary pulmonary nodule: Secondary | ICD-10-CM | POA: Diagnosis not present

## 2020-05-05 DIAGNOSIS — J449 Chronic obstructive pulmonary disease, unspecified: Secondary | ICD-10-CM | POA: Diagnosis not present

## 2020-05-07 ENCOUNTER — Telehealth: Payer: Self-pay | Admitting: Pulmonary Disease

## 2020-05-07 MED ORDER — TRELEGY ELLIPTA 200-62.5-25 MCG/INH IN AEPB: 1.0000 | INHALATION_SPRAY | Freq: Every day | RESPIRATORY_TRACT | 3 refills | Status: DC

## 2020-05-07 NOTE — Telephone Encounter (Signed)
ATC mail box is full  Refill sent  [facility]

## 2020-05-08 ENCOUNTER — Telehealth: Payer: Self-pay

## 2020-05-08 DIAGNOSIS — J9622 Acute and chronic respiratory failure with hypercapnia: Secondary | ICD-10-CM | POA: Diagnosis not present

## 2020-05-08 DIAGNOSIS — M5416 Radiculopathy, lumbar region: Secondary | ICD-10-CM | POA: Diagnosis not present

## 2020-05-08 DIAGNOSIS — I110 Hypertensive heart disease with heart failure: Secondary | ICD-10-CM | POA: Diagnosis not present

## 2020-05-08 DIAGNOSIS — M19041 Primary osteoarthritis, right hand: Secondary | ICD-10-CM | POA: Diagnosis not present

## 2020-05-08 DIAGNOSIS — M19042 Primary osteoarthritis, left hand: Secondary | ICD-10-CM | POA: Diagnosis not present

## 2020-05-08 DIAGNOSIS — M170 Bilateral primary osteoarthritis of knee: Secondary | ICD-10-CM | POA: Diagnosis not present

## 2020-05-08 DIAGNOSIS — G8929 Other chronic pain: Secondary | ICD-10-CM | POA: Diagnosis not present

## 2020-05-08 DIAGNOSIS — I5032 Chronic diastolic (congestive) heart failure: Secondary | ICD-10-CM | POA: Diagnosis not present

## 2020-05-08 DIAGNOSIS — J449 Chronic obstructive pulmonary disease, unspecified: Secondary | ICD-10-CM | POA: Diagnosis not present

## 2020-05-08 NOTE — Telephone Encounter (Signed)
Patient is aware nothing further is needed at this time. 

## 2020-05-08 NOTE — Telephone Encounter (Signed)
Please go to ED  now

## 2020-05-08 NOTE — Telephone Encounter (Signed)
Angle from [facility] called to report that patient's O2 stats remaining between 77-78 even after being on 5 liters of oxygen. She reports patient has felt tired and has stated she has an increase in weakness and fatigued. Patient has also complete neb treatment while she was there doing in home visit this afternoon. She has used all 3 of her inhalers today as well. She notes that patient's breathing didn't demonstrate any wheezing at all.  HR was at 94 and stable. She would like for [name] to advise patient and call the husband back at [phone] (listed in chart as significant other)

## 2020-05-09 ENCOUNTER — Telehealth: Payer: Self-pay | Admitting: Internal Medicine

## 2020-05-09 ENCOUNTER — Telehealth: Payer: Self-pay | Admitting: Pulmonary Disease

## 2020-05-09 DIAGNOSIS — M19042 Primary osteoarthritis, left hand: Secondary | ICD-10-CM | POA: Diagnosis not present

## 2020-05-09 DIAGNOSIS — M170 Bilateral primary osteoarthritis of knee: Secondary | ICD-10-CM | POA: Diagnosis not present

## 2020-05-09 DIAGNOSIS — I5032 Chronic diastolic (congestive) heart failure: Secondary | ICD-10-CM | POA: Diagnosis not present

## 2020-05-09 DIAGNOSIS — M19041 Primary osteoarthritis, right hand: Secondary | ICD-10-CM | POA: Diagnosis not present

## 2020-05-09 DIAGNOSIS — I110 Hypertensive heart disease with heart failure: Secondary | ICD-10-CM | POA: Diagnosis not present

## 2020-05-09 DIAGNOSIS — J449 Chronic obstructive pulmonary disease, unspecified: Secondary | ICD-10-CM | POA: Diagnosis not present

## 2020-05-09 DIAGNOSIS — G8929 Other chronic pain: Secondary | ICD-10-CM | POA: Diagnosis not present

## 2020-05-09 DIAGNOSIS — M5416 Radiculopathy, lumbar region: Secondary | ICD-10-CM | POA: Diagnosis not present

## 2020-05-09 DIAGNOSIS — J9622 Acute and chronic respiratory failure with hypercapnia: Secondary | ICD-10-CM | POA: Diagnosis not present

## 2020-05-09 NOTE — Telephone Encounter (Signed)
F/u    The patient wanted to report EMS came to her home advise she did not need to go to the emergency room.

## 2020-05-09 NOTE — Telephone Encounter (Signed)
Called pts husband there was no answer. However I was able to leave a VM with Dr. Gwynn Burly orders and marked it as Urgent.

## 2020-05-09 NOTE — Telephone Encounter (Signed)
New message:   Pt is returning a call and I have advised her to go to the ED and another note from Pulmonary has advised her to call 911 due to a irregular heart rate. Please advise. Pt states she will follow up when she gets back.

## 2020-05-09 NOTE — Telephone Encounter (Signed)
I informed the patient to call 911 as directed by the other nurse for her irregular heart heart rate. She stated she would nothing further is needed at this time.

## 2020-05-09 NOTE — Telephone Encounter (Signed)
Sent [name] an [place] of pts message.

## 2020-05-12 DIAGNOSIS — J449 Chronic obstructive pulmonary disease, unspecified: Secondary | ICD-10-CM | POA: Diagnosis not present

## 2020-05-12 DIAGNOSIS — J45909 Unspecified asthma, uncomplicated: Secondary | ICD-10-CM | POA: Diagnosis not present

## 2020-05-14 ENCOUNTER — Other Ambulatory Visit: Payer: Medicare HMO

## 2020-05-14 ENCOUNTER — Other Ambulatory Visit: Payer: Self-pay

## 2020-05-14 DIAGNOSIS — Z515 Encounter for palliative care: Secondary | ICD-10-CM

## 2020-05-14 NOTE — Patient Outreach (Signed)
[facility]) Care Management  05/14/2020  Jasmine [name] 1946/01/13 [id number]   Telephone call to patient for disease management follow up. No answer. Voice mail full.  Plan: RN CM will attempt patient again in the month of August and send letter.   [name], RN, MSN [place] Management Care Management Coordinator Direct Line [phone] Cell [phone] Toll Free: [phone]  Fax: [phone]

## 2020-05-14 NOTE — Progress Notes (Signed)
COMMUNITY PALLIATIVE CARE SW NOTE  PATIENT NAME: Jasmine [name] DOB: Jan 23, 1946 MRN: [id number]  PRIMARY CARE PROVIDER: Biagio Borg, MD  RESPONSIBLE PARTY:  Acct ID - Guarantor Home Phone Work Phone Relationship Acct Type  [id number] COLTON, [phone]  Self P/F     [address], [place], [place] [address]     PLAN OF CARE and INTERVENTIONS:             1. GOALS OF CARE/ ADVANCE CARE PLANNING:  Patient is a Full Code. Patient states her goal is to avoid hospitalization and remain in her home as independent as possible. 2. SOCIAL/EMOTIONAL/SPIRITUAL ASSESSMENT/ INTERVENTIONS:  SW met with patient in her home for scheduled visit. Patient shared that she has been doing good. Patient resides with her ex-husband in a one story home. Patient has not had any hospitalizations recently. Patient shared that her previous hospitalizations were caused by increased CO2 levels, patient shared she has been using her BiPAP machine as prescribed at night time believes that it has been really helping her. Patient shared that she is still receiving home health nursing services through [facility] and the nurse is scheduled to visit tomorrow. Patient checks her O2 and heart rate daily. Patients O2 and heart rate during visit was 95/64. Patient continues on 4L of O2. Patient has not had any pain. Patient stated that her BiPAP machine mask keeps her awake most nights, patient will be outreaching [employer] to request a new/different mask. Patients most recent weight was 111.4. Patients appetite is good and she eats at least 2 big meals a day. Patient shared that she is hard of heating and would like more information on [employer]. SW looked up information, reviewed application qualifications and supplied patient with the contact number. SW will print application supply to patient should she want the application. SW discussed goals, reviewed care plan, provided emotional support, used active and reflective  listening. 3. PATIENT/CAREGIVER EDUCATION/ COPING:  Patient was A&O x3 and engaged during visit. Patient was open and expressed desires and needs with SW. Patient denied any feelings of stress or anxiety. Patient does not have any children, but does have a large family of siblings and nieces and nephews, most whom live locally and are supportive as well as her ex-husband.  4. PERSONAL EMERGENCY PLAN:  Patient to call 911 for emergencies. 5. COMMUNITY RESOURCES COORDINATION/ HEALTH CARE NAVIGATION:  Patient manages her own health care. Jasmine [name] provides in home care.  6. FINANCIAL/LEGAL CONCERNS/INTERVENTIONS:  Patient is on a fixed income. No concerns.     SOCIAL HX:  Social History   Tobacco Use  . Smoking status: Former Smoker    Packs/day: 1.00    Years: 52.00    Pack years: 52.00    Types: Cigarettes    Start date: 1965    Quit date: 07/20/2016    Years since quitting: 3.8  . Smokeless tobacco: Never Used  Substance Use Topics  . Alcohol use: Not Currently    CODE STATUS: FULL CODE  ADVANCED DIRECTIVES: N MOST FORM COMPLETE:  N HOSPICE EDUCATION PROVIDED: N  [id number] is independent with all ADL's, patient shares that she know her limit and takes breaks when she needs to. Patient is ambulatory without AD. Ex-husband assist with some light household work.   Time Spent: 1hr.       Doreene Eland, LCSW

## 2020-05-18 DIAGNOSIS — M19042 Primary osteoarthritis, left hand: Secondary | ICD-10-CM | POA: Diagnosis not present

## 2020-05-18 DIAGNOSIS — G8929 Other chronic pain: Secondary | ICD-10-CM | POA: Diagnosis not present

## 2020-05-18 DIAGNOSIS — M19041 Primary osteoarthritis, right hand: Secondary | ICD-10-CM | POA: Diagnosis not present

## 2020-05-18 DIAGNOSIS — M5416 Radiculopathy, lumbar region: Secondary | ICD-10-CM | POA: Diagnosis not present

## 2020-05-18 DIAGNOSIS — J9622 Acute and chronic respiratory failure with hypercapnia: Secondary | ICD-10-CM | POA: Diagnosis not present

## 2020-05-18 DIAGNOSIS — J449 Chronic obstructive pulmonary disease, unspecified: Secondary | ICD-10-CM | POA: Diagnosis not present

## 2020-05-18 DIAGNOSIS — I5032 Chronic diastolic (congestive) heart failure: Secondary | ICD-10-CM | POA: Diagnosis not present

## 2020-05-18 DIAGNOSIS — M170 Bilateral primary osteoarthritis of knee: Secondary | ICD-10-CM | POA: Diagnosis not present

## 2020-05-18 DIAGNOSIS — I110 Hypertensive heart disease with heart failure: Secondary | ICD-10-CM | POA: Diagnosis not present

## 2020-05-22 ENCOUNTER — Telehealth: Payer: Self-pay | Admitting: Pulmonary Disease

## 2020-05-22 NOTE — Telephone Encounter (Signed)
Called and spoke with Jasmine [name] to let her know that RX is for Trelegy 200. She expressed understanding. Nothing further needed at this time.

## 2020-05-25 ENCOUNTER — Telehealth: Payer: Self-pay | Admitting: Internal Medicine

## 2020-05-25 DIAGNOSIS — M19042 Primary osteoarthritis, left hand: Secondary | ICD-10-CM | POA: Diagnosis not present

## 2020-05-25 DIAGNOSIS — I5032 Chronic diastolic (congestive) heart failure: Secondary | ICD-10-CM | POA: Diagnosis not present

## 2020-05-25 DIAGNOSIS — M170 Bilateral primary osteoarthritis of knee: Secondary | ICD-10-CM | POA: Diagnosis not present

## 2020-05-25 DIAGNOSIS — M19041 Primary osteoarthritis, right hand: Secondary | ICD-10-CM | POA: Diagnosis not present

## 2020-05-25 DIAGNOSIS — J449 Chronic obstructive pulmonary disease, unspecified: Secondary | ICD-10-CM | POA: Diagnosis not present

## 2020-05-25 DIAGNOSIS — I110 Hypertensive heart disease with heart failure: Secondary | ICD-10-CM | POA: Diagnosis not present

## 2020-05-25 DIAGNOSIS — G8929 Other chronic pain: Secondary | ICD-10-CM | POA: Diagnosis not present

## 2020-05-25 DIAGNOSIS — M5416 Radiculopathy, lumbar region: Secondary | ICD-10-CM | POA: Diagnosis not present

## 2020-05-25 DIAGNOSIS — J9622 Acute and chronic respiratory failure with hypercapnia: Secondary | ICD-10-CM | POA: Diagnosis not present

## 2020-05-25 NOTE — Telephone Encounter (Signed)
    Angel from [facility] calling to report patient had 1 pound weight gain (114 lbs today) Patient has +1 pitting edema ankles and feet Patient advised to elevate feet  Phone [phone]

## 2020-05-25 NOTE — Telephone Encounter (Signed)
Sent to Dr. John. 

## 2020-05-31 ENCOUNTER — Telehealth: Payer: Self-pay

## 2020-05-31 DIAGNOSIS — J9622 Acute and chronic respiratory failure with hypercapnia: Secondary | ICD-10-CM | POA: Diagnosis not present

## 2020-05-31 DIAGNOSIS — M19042 Primary osteoarthritis, left hand: Secondary | ICD-10-CM | POA: Diagnosis not present

## 2020-05-31 DIAGNOSIS — M19041 Primary osteoarthritis, right hand: Secondary | ICD-10-CM | POA: Diagnosis not present

## 2020-05-31 DIAGNOSIS — I5032 Chronic diastolic (congestive) heart failure: Secondary | ICD-10-CM | POA: Diagnosis not present

## 2020-05-31 DIAGNOSIS — I110 Hypertensive heart disease with heart failure: Secondary | ICD-10-CM | POA: Diagnosis not present

## 2020-05-31 DIAGNOSIS — M5416 Radiculopathy, lumbar region: Secondary | ICD-10-CM | POA: Diagnosis not present

## 2020-05-31 DIAGNOSIS — G8929 Other chronic pain: Secondary | ICD-10-CM | POA: Diagnosis not present

## 2020-05-31 DIAGNOSIS — M170 Bilateral primary osteoarthritis of knee: Secondary | ICD-10-CM | POA: Diagnosis not present

## 2020-05-31 DIAGNOSIS — J449 Chronic obstructive pulmonary disease, unspecified: Secondary | ICD-10-CM | POA: Diagnosis not present

## 2020-05-31 NOTE — Telephone Encounter (Signed)
  New message    Glenard Haring RN call to report increase swelling in left ankles patient has been on her feet a little more this week than usual.   The patient is asymptomatic no increase in sob/chest pain from a cardiac standpoint.  Glenard Haring voiced to have the CMA call back to discuss further

## 2020-06-02 DIAGNOSIS — B9561 Methicillin susceptible Staphylococcus aureus infection as the cause of diseases classified elsewhere: Secondary | ICD-10-CM | POA: Diagnosis not present

## 2020-06-02 DIAGNOSIS — J449 Chronic obstructive pulmonary disease, unspecified: Secondary | ICD-10-CM | POA: Diagnosis not present

## 2020-06-02 DIAGNOSIS — J9612 Chronic respiratory failure with hypercapnia: Secondary | ICD-10-CM | POA: Diagnosis not present

## 2020-06-02 DIAGNOSIS — R7881 Bacteremia: Secondary | ICD-10-CM | POA: Diagnosis not present

## 2020-06-04 NOTE — Telephone Encounter (Signed)
I think ok to continue to monitor for now, but if wt increases another 3-5 lbs, pt should have OV

## 2020-06-04 NOTE — Telephone Encounter (Signed)
Sent to Dr. John. 

## 2020-06-07 ENCOUNTER — Telehealth: Payer: Self-pay

## 2020-06-07 NOTE — Telephone Encounter (Signed)
Telephone call to schedule palliative care visit.  Patient in agreement with telephone visit 06/11/20 at 10:00 AM.

## 2020-06-08 ENCOUNTER — Encounter: Payer: Self-pay | Admitting: Internal Medicine

## 2020-06-08 ENCOUNTER — Ambulatory Visit (INDEPENDENT_AMBULATORY_CARE_PROVIDER_SITE_OTHER): Payer: Medicare HMO | Admitting: Internal Medicine

## 2020-06-08 ENCOUNTER — Other Ambulatory Visit: Payer: Self-pay

## 2020-06-08 VITALS — BP 110/60 | HR 90 | Temp 98.6°F | Ht 60.0 in | Wt 109.0 lb

## 2020-06-08 DIAGNOSIS — I2781 Cor pulmonale (chronic): Secondary | ICD-10-CM | POA: Diagnosis not present

## 2020-06-08 DIAGNOSIS — J432 Centrilobular emphysema: Secondary | ICD-10-CM

## 2020-06-08 DIAGNOSIS — E538 Deficiency of other specified B group vitamins: Secondary | ICD-10-CM

## 2020-06-08 DIAGNOSIS — Z9981 Dependence on supplemental oxygen: Secondary | ICD-10-CM | POA: Diagnosis not present

## 2020-06-08 DIAGNOSIS — R739 Hyperglycemia, unspecified: Secondary | ICD-10-CM | POA: Diagnosis not present

## 2020-06-08 DIAGNOSIS — E559 Vitamin D deficiency, unspecified: Secondary | ICD-10-CM | POA: Diagnosis not present

## 2020-06-08 DIAGNOSIS — E039 Hypothyroidism, unspecified: Secondary | ICD-10-CM | POA: Diagnosis not present

## 2020-06-08 DIAGNOSIS — I10 Essential (primary) hypertension: Secondary | ICD-10-CM | POA: Diagnosis not present

## 2020-06-08 NOTE — Progress Notes (Signed)
Subjective:    Patient ID: Jasmine [name], female    DOB: 17-Aug-1946, 74 y.o.   MRN: [id number]  HPI  Here to f/u; overall doing ok,  Pt denies chest pain, increasing sob or doe, wheezing, orthopnea, PND, increased LE swelling, palpitations, dizziness or syncope.  Pt denies new neurological symptoms such as new headache, or facial or extremity weakness or numbness.  Pt denies polydipsia, polyuria, or low sugar episode.  Pt states overall good compliance with meds, mostly trying to follow appropriate diet, with wt overall stable,  but little exercise however. Back with first husband as friend now and lives with her.     Pt denies fever, wt loss, night sweats, loss of appetite, or other constitutional symptoms  Denies hyper or hypo thyroid symptoms such as voice, skin or hair change. Past Medical History:  Diagnosis Date  . ALLERGIC RHINITIS 05/01/2007  . Anxiety   . Arthritis    "hands" (12/28/2015)  . ASTHMA 05/01/2007   "since I was a child"  . Cancer (HCC)    LUNG  . CHF (congestive heart failure) ([place])   . Chronic bronchitis ([place])   . COPD (chronic obstructive pulmonary disease) ([place])   . DEPRESSION 07/21/2008  . DVT (deep venous thrombosis) (HCC) 1960s   LLE  . FATIGUE 10/10/2010  . HYPERTENSION 05/01/2007  . HYPOTHYROIDISM 08/23/2009   s/p RAI  . LUMBAR RADICULOPATHY, RIGHT 08/25/2008  . On home oxygen therapy    "3-4L; qd; all the time" (12/28/2015)  . OSTEOPOROSIS 05/01/2007  . SHOULDER PAIN, LEFT 08/23/2009  . SINUSITIS, CHRONIC 10/10/2010   Past Surgical History:  Procedure Laterality Date  . APPENDECTOMY    . DILATION AND CURETTAGE OF UTERUS  multiple   history of multiple dialations and curettages and miscarriages, unfortunately never carrying a child to term  . ECTOPIC PREGNANCY SURGERY  "late '60s or early '70s  . ELECTROCARDIOGRAM  06/20/2006  . OOPHORECTOMY Right     reports that she quit smoking about 3 years ago. Her smoking use included cigarettes. She started  smoking about 56 years ago. She has a 52.00 pack-year smoking history. She has never used smokeless tobacco. She reports previous alcohol use. She reports previous drug use. family history includes Alcohol abuse in her brother; Asthma in an other family member; Diabetes in her brother; Hypertension in an other family member; Lung cancer in her father; Stroke in her mother; Thyroid disease in her mother. Allergies  Allergen Reactions  . Alendronate Sodium Other (See Comments)    Patient does not remember this reaction   Current Outpatient Medications on File Prior to Visit  Medication Sig Dispense Refill  . albuterol (PROVENTIL) (2.5 MG/3ML) 0.083% nebulizer solution Take 3 mLs (2.5 mg total) by nebulization every 6 (six) hours as needed for wheezing or shortness of breath. 75 mL 0  . albuterol (VENTOLIN HFA) 108 (90 Base) MCG/ACT inhaler INHALE 1 TO 2 PUFFS INTO THE LUNGS EVERY 6 (SIX) HOURS AS NEEDED FOR WHEEZING OR SHORTNESS OF BREATH. 18 g 5  . aspirin EC 81 MG EC tablet Take 1 tablet (81 mg total) by mouth daily. 30 tablet 0  . atorvastatin (LIPITOR) 10 MG tablet TAKE ONE TABLET BY MOUTH IN THE EVENING (Patient taking differently: Take 10 mg by mouth daily at 6 PM. TAKE ONE TABLET BY MOUTH IN THE EVENING) 90 tablet 0  . citalopram (CELEXA) 20 MG tablet NEW PRESCRIPTION REQUEST: CITALOPRAM 20MG  - TAKE ONE TABLET BY MOUTH DAILY (Patient taking  differently: Take 20 mg by mouth daily. ) 90 tablet 3  . Fluticasone-Umeclidin-Vilant (TRELEGY ELLIPTA) 200-62.5-25 MCG/INH AEPB Inhale 1 puff into the lungs daily. 60 each 3  . furosemide (LASIX) 40 MG tablet NEW PRESCRIPTION REQUEST: FUROSEMIDE 40MG  - TAKE ONE TABLET BY MOUTH IN THE MORNING AND TAKE ONE TABLET BY MOUTH AS NEEDED IN THE EVENING (Patient taking differently: Take 40 mg by mouth 2 (two) times daily. ) 180 tablet 3  . ipratropium-albuterol (DUONEB) 0.5-2.5 (3) MG/3ML SOLN Take 3 mLs by nebulization every 6 (six) hours as needed. 360 mL 11  .  levothyroxine (SYNTHROID) 75 MCG tablet NEW PRESCRIPTION REQUEST: LEVOTHYROXINE 75MCG - TAKE ONE TABLET BY MOUTH EARLY MORNING (Patient taking differently: Take 75 mcg by mouth daily. ) 90 tablet 3  . OXYGEN Inhale 3 L into the lungs. 24/7    . potassium chloride (KLOR-CON) 10 MEQ tablet NEW PRESCRIPTION REQUEST: KLOR-CON 10MEQ - TAKE ONE TABLET BY MOUTH TWICE DAILY 180 tablet 3  . predniSONE (DELTASONE) 10 MG tablet Take 1 tablet (10 mg total) by mouth daily. 60 tablet 1  . Roflumilast (DALIRESP) 250 MCG TABS Take 1 tablet by mouth daily. 30 tablet 5  . traMADol (ULTRAM) 50 MG tablet Take 1 tablet (50 mg total) by mouth 2 (two) times daily as needed. (Patient taking differently: Take 50 mg by mouth 2 (two) times daily. ) 60 tablet 2   No current facility-administered medications on file prior to visit.   Review of Systems All otherwise neg per pt    Objective:   Physical Exam BP 110/60 (BP Location: Left Arm, Patient Position: Sitting, Cuff Size: Large)   Pulse 90   Temp 98.6 F (37 C) (Oral)   Ht 5' (1.524 m)   Wt 109 lb (49.4 kg)   SpO2 95%   BMI 21.29 kg/m  VS noted,  Constitutional: Pt appears in NAD HENT: Head: NCAT.  Right Ear: External ear normal.  Left Ear: External ear normal.  Eyes: . Pupils are equal, round, and reactive to light. Conjunctivae and EOM are normal Nose: without d/c or deformity Neck: Neck supple. Gross normal ROM Cardiovascular: Normal rate and regular rhythm.   Pulmonary/Chest: Effort normal and breath sounds without rales or wheezing.  Abd:  Soft, NT, ND, + BS, no organomegaly Neurological: Pt is alert. At baseline orientation, motor grossly intact Skin: Skin is warm. No rashes, other new lesions, trace to 1+ bilat ankle edema Psychiatric: Pt behavior is normal without agitation  All otherwise neg per pt Lab Results  Component Value Date   WBC 9.2 06/08/2020   HGB 15.0 06/08/2020   HCT 45.4 (H) 06/08/2020   PLT 222 06/08/2020   GLUCOSE 123 (H)  06/08/2020   CHOL 220 (H) 06/08/2020   TRIG 56 06/08/2020   HDL 117 06/08/2020   LDLDIRECT 102.1 10/10/2010   LDLCALC 89 06/08/2020   ALT 21 06/08/2020   AST 26 06/08/2020   NA 141 06/08/2020   K 5.7 (H) 06/08/2020   CL 87 (L) 06/08/2020   CREATININE 1.24 (H) 06/08/2020   BUN 22 06/08/2020   CO2 >45 (H) 06/08/2020   TSH 0.40 06/08/2020   INR 1.58 (H) 03/25/2016   HGBA1C 6.6 (H) 06/08/2020         Assessment & Plan:

## 2020-06-08 NOTE — Patient Instructions (Signed)

## 2020-06-09 ENCOUNTER — Encounter: Payer: Self-pay | Admitting: Internal Medicine

## 2020-06-09 ENCOUNTER — Other Ambulatory Visit: Payer: Self-pay | Admitting: Internal Medicine

## 2020-06-09 LAB — LIPID PANEL
Cholesterol: 220 mg/dL — ABNORMAL HIGH (ref <200)
HDL: 117 mg/dL (ref >=50)
LDL Cholesterol (Calc): 89 mg/dL (calc)
Non-HDL Cholesterol (Calc): 103 mg/dL (calc) (ref <130)
Total CHOL/HDL Ratio: 1.9 (calc) (ref <5.0)
Triglycerides: 56 mg/dL (ref <150)

## 2020-06-09 LAB — COMPLETE METABOLIC PANEL WITH GFR
AG Ratio: 1.5 (calc) (ref 1.0–2.5)
ALT: 21 U/L (ref 6–29)
AST: 26 U/L (ref 10–35)
Albumin: 4.3 g/dL (ref 3.6–5.1)
Alkaline phosphatase (APISO): 53 U/L (ref 37–153)
BUN/Creatinine Ratio: 18 (calc) (ref 6–22)
BUN: 22 mg/dL (ref 7–25)
CO2: >45 mmol/L — ABNORMAL HIGH (ref 20–32)
Calcium: 11.3 mg/dL — ABNORMAL HIGH (ref 8.6–10.4)
Chloride: 87 mmol/L — ABNORMAL LOW (ref 98–110)
Creat: 1.24 mg/dL — ABNORMAL HIGH (ref 0.60–0.93)
GFR, Est African American: 49 mL/min/{1.73_m2} — ABNORMAL LOW (ref >=60)
GFR, Est Non African American: 42 mL/min/{1.73_m2} — ABNORMAL LOW (ref >=60)
Globulin: 2.8 g/dL (calc) (ref 1.9–3.7)
Glucose, Bld: 123 mg/dL — ABNORMAL HIGH (ref 65–99)
Potassium: 5.7 mmol/L — ABNORMAL HIGH (ref 3.5–5.3)
Sodium: 141 mmol/L (ref 135–146)
Total Bilirubin: 0.9 mg/dL (ref 0.2–1.2)
Total Protein: 7.1 g/dL (ref 6.1–8.1)

## 2020-06-09 LAB — CBC WITH DIFFERENTIAL/PLATELET
Absolute Monocytes: 1113 cells/uL — ABNORMAL HIGH (ref 200–950)
Basophils Absolute: 37 cells/uL (ref 0–200)
Basophils Relative: 0.4 %
Eosinophils Absolute: 129 cells/uL (ref 15–500)
Eosinophils Relative: 1.4 %
HCT: 45.4 % — ABNORMAL HIGH (ref 35.0–45.0)
Hemoglobin: 15 g/dL (ref 11.7–15.5)
Lymphs Abs: 1960 cells/uL (ref 850–3900)
MCH: 31.8 pg (ref 27.0–33.0)
MCHC: 33 g/dL (ref 32.0–36.0)
MCV: 96.2 fL (ref 80.0–100.0)
MPV: 10.5 fL (ref 7.5–12.5)
Monocytes Relative: 12.1 %
Neutro Abs: 5962 cells/uL (ref 1500–7800)
Neutrophils Relative %: 64.8 %
Platelets: 222 10*3/uL (ref 140–400)
RBC: 4.72 10*6/uL (ref 3.80–5.10)
RDW: 11.9 % (ref 11.0–15.0)
Total Lymphocyte: 21.3 %
WBC: 9.2 10*3/uL (ref 3.8–10.8)

## 2020-06-09 LAB — HEMOGLOBIN A1C
Hgb A1c MFr Bld: 6.6 % of total Hgb — ABNORMAL HIGH (ref <5.7)
Mean Plasma Glucose: 143 (calc)
eAG (mmol/L): 7.9 (calc)

## 2020-06-09 LAB — VITAMIN D 25 HYDROXY (VIT D DEFICIENCY, FRACTURES): Vit D, 25-Hydroxy: 17 ng/mL — ABNORMAL LOW (ref 30–100)

## 2020-06-09 LAB — TSH: TSH: 0.4 mIU/L (ref 0.40–4.50)

## 2020-06-09 LAB — VITAMIN B12: Vitamin B-12: 807 pg/mL (ref 200–1100)

## 2020-06-09 MED ORDER — VITAMIN D (ERGOCALCIFEROL) 1.25 MG (50000 UNIT) PO CAPS: 50000.0000 [IU] | ORAL_CAPSULE | ORAL | 0 refills | Status: DC

## 2020-06-09 NOTE — Assessment & Plan Note (Signed)
stable overall by history and exam, recent data reviewed with pt, and pt to continue medical treatment as before,  to f/u any worsening symptoms or concerns  

## 2020-06-09 NOTE — Assessment & Plan Note (Signed)
stable overall by history and exam with very mild ankle edema only, recent data reviewed with pt, and pt to continue medical treatment as before,  to f/u any worsening symptoms or concerns

## 2020-06-09 NOTE — Assessment & Plan Note (Addendum)
stable overall by history and exam, recent data reviewed with pt, and pt to continue medical treatment as before,  to f/u any worsening symptoms or concerns  I spent 31 minutes in preparing to see the patient by review of recent labs, imaging and procedures, obtaining and reviewing separately obtained history, communicating with the patient and family or caregiver, ordering medications, tests or procedures, and documenting clinical information in the EHR including the differential Dx, treatment, and any further evaluation and other management of copd, home o2, cor pulmonale, htn, hyperglycemia, hypothyroidism

## 2020-06-09 NOTE — Assessment & Plan Note (Signed)
On 3l - stable overall by history and exam, recent data reviewed with pt, and pt to continue medical treatment as before,  to f/u any worsening symptoms or concerns

## 2020-06-11 ENCOUNTER — Other Ambulatory Visit: Payer: Self-pay

## 2020-06-11 ENCOUNTER — Other Ambulatory Visit: Payer: Medicare HMO

## 2020-06-11 VITALS — Ht 60.0 in | Wt 109.0 lb

## 2020-06-11 DIAGNOSIS — Z515 Encounter for palliative care: Secondary | ICD-10-CM

## 2020-06-11 NOTE — Progress Notes (Signed)
PATIENT NAME: Jasmine [name] DOB: 06/10/1946 MRN: [id number]  PRIMARY CARE PROVIDER: Biagio Borg, MD  RESPONSIBLE PARTY:  Acct ID - Guarantor Home Phone Work Phone Relationship Acct Type  [id number] VELINA, [id number] [phone]  Self P/F     [address] [place], [place] [address]    PLAN OF CARE and INTERVENTIONS:               1.  GOALS OF CARE/ ADVANCE CARE PLANNING:  Remain and home and remain independent as possible.               2.  PATIENT/CAREGIVER EDUCATION:  Education on fall precautions, education on s/s of infection, reviewed meds, support               3.  DISEASE STATUS: RN completed scheduled telephonic health visit. Patient answered the phone. Patient alert and oriented and States "no not really" when nurse asked her if she is having any pain. Patient saw Dr Jenny Reichmann 06/08/20. Patients Breo and Spiriva have been discontinued and patient was placed on a Ellipta Trilogy one puff daily. Patient reports she continues to do neb treatments daily and continues to take Daliresp.  Patient also continues to be on 10 mg dose of prednisone daily. Patient has not had any hospitalizations in the past month. Patient receiving O2 via nasal cannula at three liters per minute. Patient has pulse ox in home and reports pulse Ox reading ranging from 86 to 90 on 3 liters of oxygen. Patient receiving [place] through [facility]. Patient is planning on having cataract surgery in thr near future.  Patient reports her breathing is pretty good. Patient denies having any cough or wheezing. Patients current weight 109 lbs at MD's office. Patient deny suffering any recent falls. Patient reports she is able to cook and keep her kitchen clean. Patient reports her heart rate is ranging from 89 to 103. Patient states she has hyperthyroidism and skin has been dry and flaky. Patient reports she has swelling in feet and legs and continues to take 40 mg of Lasix daily as well as a 40 mg in the evening as needed.   Nurse reviewed patient's medications with patient. Patient remains in agreement with palliative care services. Patient encouraged to contact palliative care with questions or concerns.     HISTORY OF PRESENT ILLNESS:  Patient is a 74 year old female who resides with her ex-husband Jasmine [name].  Patient is followed by palliative care and is seen monthly and PRN.   CODE STATUS: Full Code  ADVANCED DIRECTIVES: Y MOST FORM: No PPS: 60%   PHYSICAL EXAM:   VITALS: Today's Vitals   06/11/20 0950  Weight: 109 lb (49.4 kg)  Height: 5' (1.524 m)  PainSc: 0-No pain    LUNGS: patient on 3 LPM O2.  Shortness of breath on exertion / denies cough  CARDIAC: heart rate 90-103 irregular EXTREMITIES: patient reports edema in LE's Lasix 40 mg daily and 40 mg PRN in the evening.   SKIN: patient reports skin is dry due to hyperthyroidism  NEURO: Alert and oriented x 4 weakness         Jasmine Simmer, RN

## 2020-06-11 NOTE — Patient Outreach (Signed)
[facility]) Care Management  06/11/2020  Jasmine [name] 11/06/1945 [id number]   Telephone call to patient for disease management follow up.   No answer.  HIPAA compliant voice message left.    Plan: If no return call, RN CM will attempt patient again in the month of October.    [name], RN, MSN [place] Management Care Management Coordinator Direct Line [phone] Cell [phone] Toll Free: [phone]  Fax: [phone]

## 2020-06-12 DIAGNOSIS — J449 Chronic obstructive pulmonary disease, unspecified: Secondary | ICD-10-CM | POA: Diagnosis not present

## 2020-06-12 DIAGNOSIS — J45909 Unspecified asthma, uncomplicated: Secondary | ICD-10-CM | POA: Diagnosis not present

## 2020-06-13 DIAGNOSIS — H40033 Anatomical narrow angle, bilateral: Secondary | ICD-10-CM | POA: Diagnosis not present

## 2020-06-13 DIAGNOSIS — H2513 Age-related nuclear cataract, bilateral: Secondary | ICD-10-CM | POA: Diagnosis not present

## 2020-06-13 DIAGNOSIS — Z961 Presence of intraocular lens: Secondary | ICD-10-CM | POA: Diagnosis not present

## 2020-07-03 DIAGNOSIS — R7881 Bacteremia: Secondary | ICD-10-CM | POA: Diagnosis not present

## 2020-07-03 DIAGNOSIS — B9561 Methicillin susceptible Staphylococcus aureus infection as the cause of diseases classified elsewhere: Secondary | ICD-10-CM | POA: Diagnosis not present

## 2020-07-03 DIAGNOSIS — J9612 Chronic respiratory failure with hypercapnia: Secondary | ICD-10-CM | POA: Diagnosis not present

## 2020-07-03 DIAGNOSIS — J449 Chronic obstructive pulmonary disease, unspecified: Secondary | ICD-10-CM | POA: Diagnosis not present

## 2020-07-07 ENCOUNTER — Other Ambulatory Visit: Payer: Self-pay | Admitting: Internal Medicine

## 2020-07-07 NOTE — Telephone Encounter (Signed)
Please refill as per office routine med refill policy (all routine meds refilled for 3 mo or monthly per pt preference up to one year from last visit, then month to month grace period for 3 mo, then further med refills will have to be denied)  

## 2020-07-13 DIAGNOSIS — J45909 Unspecified asthma, uncomplicated: Secondary | ICD-10-CM | POA: Diagnosis not present

## 2020-07-13 DIAGNOSIS — J449 Chronic obstructive pulmonary disease, unspecified: Secondary | ICD-10-CM | POA: Diagnosis not present

## 2020-07-13 DIAGNOSIS — H40013 Open angle with borderline findings, low risk, bilateral: Secondary | ICD-10-CM | POA: Diagnosis not present

## 2020-07-13 DIAGNOSIS — H18413 Arcus senilis, bilateral: Secondary | ICD-10-CM | POA: Diagnosis not present

## 2020-07-13 DIAGNOSIS — H25013 Cortical age-related cataract, bilateral: Secondary | ICD-10-CM | POA: Diagnosis not present

## 2020-07-13 DIAGNOSIS — H2512 Age-related nuclear cataract, left eye: Secondary | ICD-10-CM | POA: Diagnosis not present

## 2020-07-13 DIAGNOSIS — H25043 Posterior subcapsular polar age-related cataract, bilateral: Secondary | ICD-10-CM | POA: Diagnosis not present

## 2020-07-13 DIAGNOSIS — H2513 Age-related nuclear cataract, bilateral: Secondary | ICD-10-CM | POA: Diagnosis not present

## 2020-07-16 ENCOUNTER — Telehealth: Payer: Self-pay

## 2020-07-16 NOTE — Telephone Encounter (Signed)
1215 pm.  Phone call made to patient to follow up on overall status.  No answer and unable to leave a VM for patient.  PLAN: I will attempt to call patient again in October.

## 2020-07-20 ENCOUNTER — Telehealth: Payer: Self-pay | Admitting: Pulmonary Disease

## 2020-07-20 NOTE — Telephone Encounter (Signed)
Tried calling the pt, she did not answer and her VM is full, will call back

## 2020-07-23 NOTE — Telephone Encounter (Signed)
Tried calling the pt again and still no answer or option to leave a VM  Will close per protocol

## 2020-07-27 ENCOUNTER — Telehealth: Payer: Self-pay | Admitting: Pulmonary Disease

## 2020-07-27 NOTE — Telephone Encounter (Signed)
Pt calling to find out if bipap can be worn during day time hours. Pt wants to know if  carbon monoxide/oxygen combo is only affective while sleeping or if it will work while awake. Pt states skin as developed psoriasis. Pt is on levothyroxin for overactive thyroid. Pt not sure if it's from thyroid or trelegy. Please [phone]

## 2020-07-27 NOTE — Telephone Encounter (Signed)
LMTCB

## 2020-07-30 NOTE — Telephone Encounter (Signed)
LMTCB x2 for pt 

## 2020-07-31 NOTE — Telephone Encounter (Signed)
ATC pt. VM is full. Will try once more to reach pt then will close encounter.

## 2020-08-02 ENCOUNTER — Encounter: Payer: Self-pay | Admitting: Emergency Medicine

## 2020-08-02 DIAGNOSIS — J9612 Chronic respiratory failure with hypercapnia: Secondary | ICD-10-CM | POA: Diagnosis not present

## 2020-08-02 DIAGNOSIS — R7881 Bacteremia: Secondary | ICD-10-CM | POA: Diagnosis not present

## 2020-08-02 DIAGNOSIS — J449 Chronic obstructive pulmonary disease, unspecified: Secondary | ICD-10-CM | POA: Diagnosis not present

## 2020-08-02 DIAGNOSIS — B9561 Methicillin susceptible Staphylococcus aureus infection as the cause of diseases classified elsewhere: Secondary | ICD-10-CM | POA: Diagnosis not present

## 2020-08-02 NOTE — Telephone Encounter (Signed)
lmtcb for pt.  Will close encounter due to several unsuccessful attempts to reach pt with no return call. Will close per triage protocol.

## 2020-08-03 ENCOUNTER — Telehealth: Payer: Self-pay

## 2020-08-03 NOTE — Telephone Encounter (Signed)
1012 am.  Phone call made to patient's cell phone.  Voice mail is full and I am unable to leave a message.

## 2020-08-05 NOTE — Telephone Encounter (Signed)
1014 am.  Phone call made to [name] who is able to connect me to patient.  Patient reports she is doing well over all.  She has received her COVID-19 vaccine and is planning on obtaining her booster shot.  COPD is being well managed with her current regimen.  She is very meticulous with her O2 tubing since her last hospitalization.  She is wearing her bi-pap nightly now.  Inhalers and nebulizer are being used as directed. Patient is currently working with [facility] to maintain her bi-pap machine.  Her appetite has been good with 2-3 meals a day and she reports drinking Ice drinks.   She notes sometimes going out for meals.  Her weight is averaging about 111 lbs. Patient is reporting swelling to her bilateral lower extremities which has been ongoing.  She continues with furosemide daily.  Patient states she will be following up with opthalmology and gynecology for routine visits and possible cataract removal.  Patient has asked for assistance with dermatology recommendations due to some scaly and peeling areas on her skin.  She would also like to know about resources related to transportation to/from appointments.  Patient reports her friend and another family member are usually able to assist with transportation but she would like additional options if needed.  I advised that I would consult with [facility] SW for transportation resources and I would follow up with local dermatology recommendations.  Patient is open to an in-person visit next month but is requesting this be towards the end of the month due to previous scheduled appointments.

## 2020-08-06 ENCOUNTER — Other Ambulatory Visit: Payer: Self-pay

## 2020-08-06 NOTE — Patient Outreach (Signed)
[facility]) Care Management  08/06/2020  Jasmine [name] 12-Jun-1946 [id number]   Telephone call to patient for disease management follow up.  No answer.  Voicemail full.    Plan: RN CM will attempt patient again in the month of December.  [name], RN, MSN [employer] Care Management Coordinator Direct Line [phone] Cell [phone] Toll Free: [phone]  Fax: [phone]

## 2020-08-07 ENCOUNTER — Telehealth: Payer: Self-pay | Admitting: Pulmonary Disease

## 2020-08-08 NOTE — Telephone Encounter (Signed)
LMTCB for pt 

## 2020-08-08 NOTE — Telephone Encounter (Signed)
ATC pt, there was no answer and her VM was full. Will try back. 

## 2020-08-08 NOTE — Telephone Encounter (Signed)
Pt is returning phone call regarding appliance to replace her BiPap. Please call [phone]

## 2020-08-10 ENCOUNTER — Telehealth: Payer: Self-pay | Admitting: Pulmonary Disease

## 2020-08-10 NOTE — Telephone Encounter (Signed)
ATC patient x2--unable to leave vm due to mailbox being full. Encounter has been closed per office protocol.

## 2020-08-10 NOTE — Telephone Encounter (Signed)
Called and spoke with pt who stated she found an article about an appliance to replace her BIPAP. Stated to pt that we needed to get her scheduled for an appt with TP as she was to follow up with her 1 month after lat OV with RA but appt was not scheduled. Pt verbalized understanding. appt scheduled for pt with TP 10/26 and stated to pt to bring info with her that she wanted to discuss with TP about. Nothing further needed.

## 2020-08-12 DIAGNOSIS — J45909 Unspecified asthma, uncomplicated: Secondary | ICD-10-CM | POA: Diagnosis not present

## 2020-08-12 DIAGNOSIS — J449 Chronic obstructive pulmonary disease, unspecified: Secondary | ICD-10-CM | POA: Diagnosis not present

## 2020-08-14 ENCOUNTER — Encounter: Payer: Self-pay | Admitting: Adult Health

## 2020-08-14 ENCOUNTER — Other Ambulatory Visit: Payer: Self-pay

## 2020-08-14 ENCOUNTER — Ambulatory Visit (INDEPENDENT_AMBULATORY_CARE_PROVIDER_SITE_OTHER): Payer: Medicare HMO | Admitting: Adult Health

## 2020-08-14 VITALS — BP 100/50 | HR 95 | Temp 98.3°F | Ht 60.0 in | Wt 120.2 lb

## 2020-08-14 DIAGNOSIS — E440 Moderate protein-calorie malnutrition: Secondary | ICD-10-CM

## 2020-08-14 DIAGNOSIS — Z23 Encounter for immunization: Secondary | ICD-10-CM

## 2020-08-14 DIAGNOSIS — J9611 Chronic respiratory failure with hypoxia: Secondary | ICD-10-CM

## 2020-08-14 DIAGNOSIS — J432 Centrilobular emphysema: Secondary | ICD-10-CM | POA: Diagnosis not present

## 2020-08-14 DIAGNOSIS — I5032 Chronic diastolic (congestive) heart failure: Secondary | ICD-10-CM | POA: Diagnosis not present

## 2020-08-14 NOTE — Assessment & Plan Note (Signed)
Severe COPD currently compensated on aggressive maintenance regimen with Trelegy inhaler, Daliresp, chronic steroids with prednisone 10 mg daily.  Patient has high symptom burden and is very sedentary.  Encouraged on activity as tolerated.  We will continue on current regimen. She also is prone to recurrent exacerbations.  Continue with nocturnal trilogy device  Plan  Patient Instructions  Continue on Trelegy 1 puff daily.  May use Duoneb nebs every 6hr As needed  Wheezing /shortness of breath .  Continue on non-invasive Vent (TRILOGY )  At bedtime  And with naps.  Continue on Oxygen 3l/m rest and 4l/m with activity .  Continue on Daliresp daily.  Continue on Prednisone 10mg  daily .  Flu shot today  Follow up Dr. Elsworth Soho  In 3 months and As needed   Please contact office for sooner follow up if symptoms do not improve or worsen or seek emergency care

## 2020-08-14 NOTE — Progress Notes (Signed)
@Patient  ID: Jasmine [name], female    DOB: Jan 22, 1946, 74 y.o.   MRN: [id number]  Chief Complaint  Patient presents with  . Follow-up    COPD     Referring provider: Biagio Borg, MD  HPI: 74 year old female former smoker followed for very severe COPD, chronic hypoxic and hypercarbic respiratory failure, lung nodules.  Patient is managed on aggressive regimen with nocturnal trilogy device and chronic steroids with prednisone 10 mg daily. She has been maintained on oxygen since March 2014.  She is currently on 3 L of oxygen at rest and 4 L of oxygen with activity. History of polysubstance abuse with cocaine  TEST/EVENTS :  CT chest 10/2011 >>showed Moderate centrilobular emphysema. Small bilateral pleural effusions with dependent atelectasis  CT chest 07/2016-resolving right upper lobe consolidation,spiculated subcentimeter nodules that have been noted before with low-grade PET hypermetabolism CT chest February 2019 stable right upper lobe scarring, stable bilateral pulmonary nodules, emphysema  CT chest 11/2018>>stable nodules, unchanged bandlike scarring in the inferior right upper lobe stable Stable small pulmonary nodules, likely benign sequelae of prior infection. No further routine CT follow-up is required for these Nodules.  PFT >FEV1 at 0.47 L( 30%), ratio 39 , No BD response (<200cc)  08/14/2020 Follow up ; COPD , O2 RF  Patient presents for a 39-month follow-up.  Patient has very severe COPD that is oxygen and steroid-dependent.  She also has hypercarbic and hypoxic respiratory failure on oxygen 3 L at rest and 4 L with activity.  She is prone to frequent exacerbations and hypercarbic respiratory failure.  She is on nocturnal trilogy device. She is currently on Trelegy inhaler daily.  Prednisone 10 mg daily.  And Daliresp 250 mg daily. Patient's ex-husband lives with her at home and helps with her care.  Patient is very sedentary.  Gets winded with minimum  activity.  Says she has a hard time doing her household chores due to dyspnea. She says she is wearing her trilogy device each night.  She says she does feel that it is helping.  Download shows 80% compliance over the last 30 days.  Daily average usage at 5.5 hours. Patient denies any chest pain orthopnea PND or increased leg swelling. Patient was on her portable oxygen concentrator on arrival today O2 saturations were decreased in the 70s.  Patient was placed on oxygen 3 L at rest and 4 L with activity.  Walk test in the office showed no desaturations on 4 L of oxygen walking.  Was able to maintain O2 saturation greater than 90% on 4 L walking.  Allergies  Allergen Reactions  . Alendronate Sodium Other (See Comments)    Patient does not remember this reaction    Immunization History  Administered Date(s) Administered  . Fluad Quad(high Dose 65+) 08/30/2019, 08/14/2020  . Influenza Split 07/10/2011, 08/18/2012  . Influenza Whole 07/21/2008, 08/23/2009, 10/10/2010  . Influenza, High Dose Seasonal PF 07/10/2016, 09/03/2017  . Influenza,inj,Quad PF,6+ Mos 08/12/2013, 07/10/2014, 07/08/2018  . PFIZER SARS-COV-2 Vaccination 12/24/2019, 01/14/2020  . Pneumococcal Conjugate-13 11/30/2014  . Pneumococcal Polysaccharide-23 07/21/2007, 01/12/2013, 06/16/2018  . Td 08/23/2009  . Zoster 07/20/2006    Past Medical History:  Diagnosis Date  . ALLERGIC RHINITIS 05/01/2007  . Anxiety   . Arthritis    "hands" (12/28/2015)  . ASTHMA 05/01/2007   "since I was a child"  . Cancer (HCC)    LUNG  . CHF (congestive heart failure) ([place])   . Chronic bronchitis ([place])   . COPD (  chronic obstructive pulmonary disease) ([place])   . DEPRESSION 07/21/2008  . DVT (deep venous thrombosis) (HCC) 1960s   LLE  . FATIGUE 10/10/2010  . HYPERTENSION 05/01/2007  . HYPOTHYROIDISM 08/23/2009   s/p RAI  . LUMBAR RADICULOPATHY, RIGHT 08/25/2008  . On home oxygen therapy    "3-4L; qd; all the time" (12/28/2015)  .  OSTEOPOROSIS 05/01/2007  . SHOULDER PAIN, LEFT 08/23/2009  . SINUSITIS, CHRONIC 10/10/2010    Tobacco History: Social History   Tobacco Use  Smoking Status Former Smoker  . Packs/day: 1.00  . Years: 52.00  . Pack years: 52.00  . Types: Cigarettes  . Start date: 33  . Quit date: 07/20/2016  . Years since quitting: 4.0  Smokeless Tobacco Never Used   Counseling given: Not Answered   Outpatient Medications Prior to Visit  Medication Sig Dispense Refill  . albuterol (PROVENTIL) (2.5 MG/3ML) 0.083% nebulizer solution Take 3 mLs (2.5 mg total) by nebulization every 6 (six) hours as needed for wheezing or shortness of breath. 75 mL 0  . albuterol (VENTOLIN HFA) 108 (90 Base) MCG/ACT inhaler INHALE 1 TO 2 PUFFS INTO THE LUNGS EVERY 6 (SIX) HOURS AS NEEDED FOR WHEEZING OR SHORTNESS OF BREATH. 18 g 5  . aspirin EC 81 MG EC tablet Take 1 tablet (81 mg total) by mouth daily. 30 tablet 0  . atorvastatin (LIPITOR) 10 MG tablet TAKE ONE TABLET BY MOUTH IN THE EVENING (Patient taking differently: Take 10 mg by mouth daily at 6 PM. TAKE ONE TABLET BY MOUTH IN THE EVENING) 90 tablet 0  . citalopram (CELEXA) 20 MG tablet NEW PRESCRIPTION REQUEST: CITALOPRAM 20MG  - TAKE ONE TABLET BY MOUTH DAILY (Patient taking differently: Take 20 mg by mouth daily. ) 90 tablet 3  . Fluticasone-Umeclidin-Vilant (TRELEGY ELLIPTA) 200-62.5-25 MCG/INH AEPB Inhale 1 puff into the lungs daily. 60 each 3  . furosemide (LASIX) 40 MG tablet NEW PRESCRIPTION REQUEST: FUROSEMIDE 40MG  - TAKE ONE TABLET BY MOUTH IN THE MORNING AND TAKE ONE TABLET BY MOUTH AS NEEDED IN THE EVENING (Patient taking differently: Take 40 mg by mouth 2 (two) times daily. ) 180 tablet 3  . ipratropium-albuterol (DUONEB) 0.5-2.5 (3) MG/3ML SOLN Take 3 mLs by nebulization every 6 (six) hours as needed. 360 mL 11  . levothyroxine (SYNTHROID) 75 MCG tablet NEW PRESCRIPTION REQUEST: LEVOTHYROXINE 75MCG - TAKE ONE TABLET BY MOUTH EARLY MORNING (Patient taking  differently: Take 75 mcg by mouth daily. ) 90 tablet 3  . OXYGEN Inhale 3 L into the lungs. 24/7    . potassium chloride (KLOR-CON) 10 MEQ tablet TAKE 1 TABLET BY MOUTH 2 TIMES DAILY. 180 tablet 3  . predniSONE (DELTASONE) 10 MG tablet Take 1 tablet (10 mg total) by mouth daily. 60 tablet 1  . Roflumilast (DALIRESP) 250 MCG TABS Take 1 tablet by mouth daily. 30 tablet 5  . traMADol (ULTRAM) 50 MG tablet Take 1 tablet (50 mg total) by mouth 2 (two) times daily as needed. (Patient taking differently: Take 50 mg by mouth 2 (two) times daily. ) 60 tablet 2  . Vitamin D, Ergocalciferol, (DRISDOL) 1.25 MG (50000 UNIT) CAPS capsule Take 1 capsule (50,000 Units total) by mouth every 7 (seven) days. 12 capsule 0   No facility-administered medications prior to visit.     Review of Systems:   Constitutional:   No  weight loss, night sweats,  Fevers, chills,  +fatigue, or  lassitude.  HEENT:   No headaches,  Difficulty swallowing,  Tooth/dental problems, or  Sore throat,                No sneezing, itching, ear ache, nasal congestion, post nasal drip,   CV:  No chest pain,  Orthopnea, PND, swelling in lower extremities, anasarca, dizziness, palpitations, syncope.   GI  No heartburn, indigestion, abdominal pain, nausea, vomiting, diarrhea, change in bowel habits, loss of appetite, bloody stools.   Resp:    No chest wall deformity  Skin: no rash or lesions.  GU: no dysuria, change in color of urine, no urgency or frequency.  No flank pain, no hematuria   MS:  No joint pain or swelling.  No decreased range of motion.  No back pain.    Physical Exam  BP (!) 100/50 (BP Location: Left Arm, Patient Position: Sitting, Cuff Size: Normal)   Pulse 95   Temp 98.3 F (36.8 C) (Temporal)   Ht 5' (1.524 m)   Wt 120 lb 3.2 oz (54.5 kg)   SpO2 99% Comment: 4L after exertion, up to 95% with rest.  BMI 23.47 kg/m   GEN: A/Ox3; pleasant , NAD, elderly, on oxygen   HEENT:  [place]/AT,  NOSE-clear,  THROAT-clear, no lesions, no postnasal drip or exudate noted.   NECK:  Supple w/ fair ROM; no JVD; normal carotid impulses w/o bruits; no thyromegaly or nodules palpated; no lymphadenopathy.    RESP diminished breath sounds in the bases , no accessory muscle use, no dullness to percussion  CARD:  RRR, no m/r/g, no peripheral edema, pulses intact, no cyanosis or clubbing.  GI:   Soft & nt; nml bowel sounds; no organomegaly or masses detected.   Musco: Warm bil, no deformities or joint swelling noted.   Neuro: alert, no focal deficits noted.    Skin: Warm, no lesions or rashes    Lab Results:  CBC  BMET   BNP  Imaging: No results found.    No flowsheet data found.  No results found for: NITRICOXIDE      Assessment & Plan:   COPD (chronic obstructive pulmonary disease) (HCC) Severe COPD currently compensated on aggressive maintenance regimen with Trelegy inhaler, Daliresp, chronic steroids with prednisone 10 mg daily.  Patient has high symptom burden and is very sedentary.  Encouraged on activity as tolerated.  We will continue on current regimen. She also is prone to recurrent exacerbations.  Continue with nocturnal trilogy device  Plan  Patient Instructions  Continue on Trelegy 1 puff daily.  May use Duoneb nebs every 6hr As needed  Wheezing /shortness of breath .  Continue on non-invasive Vent (TRILOGY )  At bedtime  And with naps.  Continue on Oxygen 3l/m rest and 4l/m with activity .  Continue on Daliresp daily.  Continue on Prednisone 10mg  daily .  Flu shot today  Follow up Dr. Elsworth Soho  In 3 months and As needed   Please contact office for sooner follow up if symptoms do not improve or worsen or seek emergency care                Chronic respiratory failure with hypoxia ([place]) Chronic hypoxic and hypercarbic respiratory failure patient is continue on oxygen 3 L at rest and 4 L with activity to maintain O2 saturations greater than 88 to 90%.  Have  advised her to use caution when using her portable oxygen concentrator as it may not be strong enough to maintain O2 saturations adequately. She is also to continue on nocturnal trilogy device as she is prone to  chronic hypercarbia and frequent COPD exacerbations.  She is encouraged on nightly compliance usage.  Plan  Patient Instructions  Continue on Trelegy 1 puff daily.  May use Duoneb nebs every 6hr As needed  Wheezing /shortness of breath .  Continue on non-invasive Vent (TRILOGY )  At bedtime  And with naps.  Continue on Oxygen 3l/m rest and 4l/m with activity .  Continue on Daliresp daily.  Continue on Prednisone 10mg  daily .  Flu shot today  Follow up Dr. Elsworth Soho  In 3 months and As needed   Please contact office for sooner follow up if symptoms do not improve or worsen or seek emergency care                Malnutrition of moderate degree She is encouraged on a high-protein diet  Chronic diastolic congestive heart failure ([place]) Appears compensated without evidence of volume overload on exam.  Continue on current regimen     Rexene Edison, NP 08/14/2020

## 2020-08-14 NOTE — Assessment & Plan Note (Signed)
Chronic hypoxic and hypercarbic respiratory failure patient is continue on oxygen 3 L at rest and 4 L with activity to maintain O2 saturations greater than 88 to 90%.  Have advised her to use caution when using her portable oxygen concentrator as it may not be strong enough to maintain O2 saturations adequately. She is also to continue on nocturnal trilogy device as she is prone to chronic hypercarbia and frequent COPD exacerbations.  She is encouraged on nightly compliance usage.  Plan  Patient Instructions  Continue on Trelegy 1 puff daily.  May use Duoneb nebs every 6hr As needed  Wheezing /shortness of breath .  Continue on non-invasive Vent (TRILOGY )  At bedtime  And with naps.  Continue on Oxygen 3l/m rest and 4l/m with activity .  Continue on Daliresp daily.  Continue on Prednisone 10mg  daily .  Flu shot today  Follow up Dr. Elsworth Soho  In 3 months and As needed   Please contact office for sooner follow up if symptoms do not improve or worsen or seek emergency care

## 2020-08-14 NOTE — Patient Instructions (Addendum)
Continue on Trelegy 1 puff daily.  May use Duoneb nebs every 6hr As needed  Wheezing /shortness of breath .  Continue on non-invasive Vent (TRILOGY )  At bedtime  And with naps.  Continue on Oxygen 3l/m rest and 4l/m with activity .  Continue on Daliresp daily.  Continue on Prednisone 10mg  daily .  Flu shot today  Follow up Dr. Elsworth Soho  In 3 months and As needed   Please contact office for sooner follow up if symptoms do not improve or worsen or seek emergency care

## 2020-08-14 NOTE — Assessment & Plan Note (Signed)
Appears compensated without evidence of volume overload on exam.  Continue on current regimen 

## 2020-08-14 NOTE — Assessment & Plan Note (Signed)
She is encouraged on a high-protein diet 

## 2020-08-20 ENCOUNTER — Other Ambulatory Visit: Payer: Self-pay | Admitting: Pulmonary Disease

## 2020-08-20 NOTE — Telephone Encounter (Signed)
Pt is requesting refill on PREDNISONE 10 MG TAB 10 Tablet next ov 11/12/2020

## 2020-08-21 ENCOUNTER — Telehealth: Payer: Self-pay | Admitting: Pulmonary Disease

## 2020-08-21 NOTE — Telephone Encounter (Signed)
ATC pt, there was no answer and I could not leave a message due to her VM being full. Do not see any documentation where anyone from our office called her.

## 2020-08-23 DIAGNOSIS — R7881 Bacteremia: Secondary | ICD-10-CM | POA: Diagnosis not present

## 2020-08-23 DIAGNOSIS — J9612 Chronic respiratory failure with hypercapnia: Secondary | ICD-10-CM | POA: Diagnosis not present

## 2020-08-23 DIAGNOSIS — B9561 Methicillin susceptible Staphylococcus aureus infection as the cause of diseases classified elsewhere: Secondary | ICD-10-CM | POA: Diagnosis not present

## 2020-08-23 DIAGNOSIS — J449 Chronic obstructive pulmonary disease, unspecified: Secondary | ICD-10-CM | POA: Diagnosis not present

## 2020-08-23 NOTE — Telephone Encounter (Signed)
Pt returning a phone call. Stated she thinks we called her. Informed pt I did not see where no one gave her a call. Nothing further need.

## 2020-08-27 ENCOUNTER — Telehealth: Payer: Self-pay

## 2020-08-27 DIAGNOSIS — H2512 Age-related nuclear cataract, left eye: Secondary | ICD-10-CM | POA: Diagnosis not present

## 2020-08-27 NOTE — Telephone Encounter (Signed)
9:45AM: Palliative care SW outreached patient. Unable to LVM due to VM being full.

## 2020-08-27 NOTE — Telephone Encounter (Signed)
9:46AM: Palliative care SW outreached patient to complete telephonic visit.   Palliative care SW outreached patient to complete telephonic visit. Patient provided update on medical condition and/or changes. Patient shared that she has an appointment today to have cataract removed from her left eye and will go back later this month to have the R eye done. Patient reported some pain in her back and has some pain medications that she can take if needed, SW discussed natural remedies for like heat compression. No recent falls. Patient shared that she had a doctors appointment with her pulmonologist a few weeks ago and received flu shot while she was there. Patient shares that she is still on 3LPM continuously and will use 4LPM on exertion. Continues using BiPAP and trilogy. No medication changes, per patient. Patient states that he she eats well and trying to adhere to a high protein diet. Patient sleeping well, other than when she is using her BiPAP machine, she states that it is uncomfortable. Patient shared that she is not need of food or transportation at this time, but is open to receiving resources for future reference. SW will mail resources to patient. No other psychosocial needs. Patient appreciative of telephonic check in. Palliative care will continue to monitor and assist with long term care planning as needed.

## 2020-08-28 DIAGNOSIS — H2511 Age-related nuclear cataract, right eye: Secondary | ICD-10-CM | POA: Diagnosis not present

## 2020-09-02 DIAGNOSIS — J449 Chronic obstructive pulmonary disease, unspecified: Secondary | ICD-10-CM | POA: Diagnosis not present

## 2020-09-02 DIAGNOSIS — J9612 Chronic respiratory failure with hypercapnia: Secondary | ICD-10-CM | POA: Diagnosis not present

## 2020-09-02 DIAGNOSIS — R7881 Bacteremia: Secondary | ICD-10-CM | POA: Diagnosis not present

## 2020-09-02 DIAGNOSIS — B9561 Methicillin susceptible Staphylococcus aureus infection as the cause of diseases classified elsewhere: Secondary | ICD-10-CM | POA: Diagnosis not present

## 2020-09-10 NOTE — Progress Notes (Deleted)
74 y.o. [id number] Significant Other Black or African American Not Hispanic or Latino female here for annual exam.      No LMP recorded. Patient is postmenopausal.          Sexually active: {yes no:314532}  The current method of family planning is {contraception:315051}.    Exercising: {yes no:314532}  {types:19826} Smoker:  {YES [id number]  Health Maintenance: Pap:  01/16/16 WNL  History of abnormal Pap:  no MMG:  11/15/19 Density C Bi-rads 1 neg  BMD:   07/20/03 Colonoscopy: 07/20/2003 WNL, Cologuard 12/17/2018 Negative TDaP:  08/23/09  Gardasil: none    reports that she quit smoking about 4 years ago. Her smoking use included cigarettes. She started smoking about 56 years ago. She has a 52.00 pack-year smoking history. She has never used smokeless tobacco. She reports previous alcohol use. She reports previous drug use.  Past Medical History:  Diagnosis Date  . ALLERGIC RHINITIS 05/01/2007  . Anxiety   . Arthritis    "hands" (12/28/2015)  . ASTHMA 05/01/2007   "since I was a child"  . Cancer (HCC)    LUNG  . CHF (congestive heart failure) ([place])   . Chronic bronchitis ([place])   . COPD (chronic obstructive pulmonary disease) ([place])   . DEPRESSION 07/21/2008  . DVT (deep venous thrombosis) (HCC) 1960s   LLE  . FATIGUE 10/10/2010  . HYPERTENSION 05/01/2007  . HYPOTHYROIDISM 08/23/2009   s/p RAI  . LUMBAR RADICULOPATHY, RIGHT 08/25/2008  . On home oxygen therapy    "3-4L; qd; all the time" (12/28/2015)  . OSTEOPOROSIS 05/01/2007  . SHOULDER PAIN, LEFT 08/23/2009  . SINUSITIS, CHRONIC 10/10/2010    Past Surgical History:  Procedure Laterality Date  . APPENDECTOMY    . DILATION AND CURETTAGE OF UTERUS  multiple   history of multiple dialations and curettages and miscarriages, unfortunately never carrying a child to term  . ECTOPIC PREGNANCY SURGERY  "late '60s or early '70s  . ELECTROCARDIOGRAM  06/20/2006  . OOPHORECTOMY Right     Current Outpatient Medications  Medication Sig  Dispense Refill  . albuterol (PROVENTIL) (2.5 MG/3ML) 0.083% nebulizer solution Take 3 mLs (2.5 mg total) by nebulization every 6 (six) hours as needed for wheezing or shortness of breath. 75 mL 0  . albuterol (VENTOLIN HFA) 108 (90 Base) MCG/ACT inhaler INHALE 1 TO 2 PUFFS INTO THE LUNGS EVERY 6 (SIX) HOURS AS NEEDED FOR WHEEZING OR SHORTNESS OF BREATH. 18 g 5  . aspirin EC 81 MG EC tablet Take 1 tablet (81 mg total) by mouth daily. 30 tablet 0  . atorvastatin (LIPITOR) 10 MG tablet TAKE ONE TABLET BY MOUTH IN THE EVENING (Patient taking differently: Take 10 mg by mouth daily at 6 PM. TAKE ONE TABLET BY MOUTH IN THE EVENING) 90 tablet 0  . citalopram (CELEXA) 20 MG tablet NEW PRESCRIPTION REQUEST: CITALOPRAM 20MG  - TAKE ONE TABLET BY MOUTH DAILY (Patient taking differently: Take 20 mg by mouth daily. ) 90 tablet 3  . Fluticasone-Umeclidin-Vilant (TRELEGY ELLIPTA) 200-62.5-25 MCG/INH AEPB Inhale 1 puff into the lungs daily. 60 each 3  . furosemide (LASIX) 40 MG tablet NEW PRESCRIPTION REQUEST: FUROSEMIDE 40MG  - TAKE ONE TABLET BY MOUTH IN THE MORNING AND TAKE ONE TABLET BY MOUTH AS NEEDED IN THE EVENING (Patient taking differently: Take 40 mg by mouth 2 (two) times daily. ) 180 tablet 3  . ipratropium-albuterol (DUONEB) 0.5-2.5 (3) MG/3ML SOLN Take 3 mLs by nebulization every 6 (six) hours as needed. 360 mL 11  .  levothyroxine (SYNTHROID) 75 MCG tablet NEW PRESCRIPTION REQUEST: LEVOTHYROXINE 75MCG - TAKE ONE TABLET BY MOUTH EARLY MORNING (Patient taking differently: Take 75 mcg by mouth daily. ) 90 tablet 3  . OXYGEN Inhale 3 L into the lungs. 24/7    . potassium chloride (KLOR-CON) 10 MEQ tablet TAKE 1 TABLET BY MOUTH 2 TIMES DAILY. 180 tablet 3  . predniSONE (DELTASONE) 10 MG tablet TAKE 1 TABLET BY MOUTH DAILY. 60 tablet 1  . Roflumilast (DALIRESP) 250 MCG TABS Take 1 tablet by mouth daily. 30 tablet 5  . traMADol (ULTRAM) 50 MG tablet Take 1 tablet (50 mg total) by mouth 2 (two) times daily as  needed. (Patient taking differently: Take 50 mg by mouth 2 (two) times daily. ) 60 tablet 2  . Vitamin D, Ergocalciferol, (DRISDOL) 1.25 MG (50000 UNIT) CAPS capsule Take 1 capsule (50,000 Units total) by mouth every 7 (seven) days. 12 capsule 0   No current facility-administered medications for this visit.    Family History  Problem Relation Age of Onset  . Lung cancer Father   . Alcohol abuse Brother   . Diabetes Brother   . Hypertension Other   . Stroke Mother   . Thyroid disease Mother   . Asthma Other        maternal aunts  . Breast cancer Neg Hx     Review of Systems  Exam:   There were no vitals taken for this visit.  Weight change: @WEIGHTCHANGE @ Height:      Ht Readings from Last 3 Encounters:  08/14/20 5' (1.524 m)  06/11/20 5' (1.524 m)  06/08/20 5' (1.524 m)    General appearance: alert, cooperative and appears stated age Head: Normocephalic, without obvious abnormality, atraumatic Neck: no adenopathy, supple, symmetrical, trachea midline and thyroid {CHL AMB PHY EX THYROID NORM DEFAULT:[phone]::"normal to inspection and palpation"} Lungs: clear to auscultation bilaterally Cardiovascular: regular rate and rhythm Breasts: {Exam; breast:13139::"normal appearance, no masses or tenderness"} Abdomen: soft, non-tender; non distended,  no masses,  no organomegaly Extremities: extremities normal, atraumatic, no cyanosis or edema Skin: Skin color, texture, turgor normal. No rashes or lesions Lymph nodes: Cervical, supraclavicular, and axillary nodes normal. No abnormal inguinal nodes palpated Neurologic: Grossly normal   Pelvic: External genitalia:  no lesions              Urethra:  normal appearing urethra with no masses, tenderness or lesions              Bartholins and Skenes: normal                 Vagina: normal appearing vagina with normal color and discharge, no lesions              Cervix: {CHL AMB PHY EX CERVIX NORM [phone]::"no lesions"}                Bimanual Exam:  Uterus:  {CHL AMB PHY EX UTERUS NORM DEFAULT:[id number]::"normal size, contour, position, consistency, mobility, non-tender"}              Adnexa: {CHL AMB PHY EX ADNEXA NO MASS DEFAULT:[phone]::"no mass, fullness, tenderness"}               Rectovaginal: Confirms               Anus:  normal sphincter tone, no lesions  *** chaperoned for the exam.  A:  Well Woman with normal exam  P:

## 2020-09-11 ENCOUNTER — Other Ambulatory Visit: Payer: Self-pay | Admitting: Adult Health

## 2020-09-11 NOTE — Telephone Encounter (Signed)
Pt is requesting refill on DALIRESP 250 MCG TABS 250 Tablet

## 2020-09-12 ENCOUNTER — Telehealth: Payer: Self-pay | Admitting: Adult Health

## 2020-09-12 DIAGNOSIS — J45909 Unspecified asthma, uncomplicated: Secondary | ICD-10-CM | POA: Diagnosis not present

## 2020-09-12 DIAGNOSIS — J4520 Mild intermittent asthma, uncomplicated: Secondary | ICD-10-CM

## 2020-09-12 DIAGNOSIS — J449 Chronic obstructive pulmonary disease, unspecified: Secondary | ICD-10-CM | POA: Diagnosis not present

## 2020-09-12 MED ORDER — DALIRESP 250 MCG PO TABS: 1.0000 | ORAL_TABLET | Freq: Every day | ORAL | 5 refills | Status: DC

## 2020-09-12 NOTE — Telephone Encounter (Signed)
I called the pt back and was forwarded to the VM again.  Still unable to leave a message due to the VM being full. meds were sent to the pharmacy earlier.  Will close this message.

## 2020-09-12 NOTE — Telephone Encounter (Signed)
I attempted to call the pt and I was routed to her VM which was full so I could not leave a message.  I have sent the dailiresp to her pharmacy that was listed in her chart.  Will call back later to check with pt.

## 2020-09-17 ENCOUNTER — Ambulatory Visit: Payer: Medicare HMO | Admitting: Obstetrics and Gynecology

## 2020-09-17 DIAGNOSIS — Z961 Presence of intraocular lens: Secondary | ICD-10-CM | POA: Diagnosis not present

## 2020-09-17 DIAGNOSIS — H2511 Age-related nuclear cataract, right eye: Secondary | ICD-10-CM | POA: Diagnosis not present

## 2020-09-17 DIAGNOSIS — H2513 Age-related nuclear cataract, bilateral: Secondary | ICD-10-CM | POA: Diagnosis not present

## 2020-09-28 ENCOUNTER — Other Ambulatory Visit: Payer: Self-pay

## 2020-09-28 NOTE — Patient Outreach (Signed)
[facility]) Care Management  09/28/2020  Jasmine [name] 02-15-1946 [id number]   Telephone call to patient for disease management follow up.   No answer.  Voice mail full.     Plan: If no return call, RN CM will attempt patient again in the month of March.  [name], RN, MSN [place] Management Care Management Coordinator Direct Line [phone] Cell [phone] Toll Free: [phone]  Fax: [phone]

## 2020-10-02 DIAGNOSIS — J449 Chronic obstructive pulmonary disease, unspecified: Secondary | ICD-10-CM | POA: Diagnosis not present

## 2020-10-02 DIAGNOSIS — J9612 Chronic respiratory failure with hypercapnia: Secondary | ICD-10-CM | POA: Diagnosis not present

## 2020-10-02 DIAGNOSIS — R7881 Bacteremia: Secondary | ICD-10-CM | POA: Diagnosis not present

## 2020-10-02 DIAGNOSIS — B9561 Methicillin susceptible Staphylococcus aureus infection as the cause of diseases classified elsewhere: Secondary | ICD-10-CM | POA: Diagnosis not present

## 2020-10-09 ENCOUNTER — Other Ambulatory Visit: Payer: Self-pay | Admitting: Internal Medicine

## 2020-10-09 ENCOUNTER — Telehealth: Payer: Self-pay

## 2020-10-09 NOTE — Telephone Encounter (Signed)
Please refill as per office routine med refill policy (all routine meds refilled for 3 mo or monthly per pt preference up to one year from last visit, then month to month grace period for 3 mo, then further med refills will have to be denied)  

## 2020-10-09 NOTE — Telephone Encounter (Signed)
10 am.  Phone call made to patient to complete a telephonic visit.  No answer and unable to leave a voice message.   PLAN: PC will outreach patient again in the next couple of weeks.

## 2020-10-12 DIAGNOSIS — J45909 Unspecified asthma, uncomplicated: Secondary | ICD-10-CM | POA: Diagnosis not present

## 2020-10-12 DIAGNOSIS — J449 Chronic obstructive pulmonary disease, unspecified: Secondary | ICD-10-CM | POA: Diagnosis not present

## 2020-10-31 ENCOUNTER — Telehealth: Payer: Self-pay

## 2020-10-31 NOTE — Telephone Encounter (Signed)
Volunteer called patient on behalf of Palliative Care and did not get a answer from patient/family. ° °

## 2020-11-02 ENCOUNTER — Telehealth: Payer: Self-pay

## 2020-11-02 DIAGNOSIS — J449 Chronic obstructive pulmonary disease, unspecified: Secondary | ICD-10-CM | POA: Diagnosis not present

## 2020-11-02 DIAGNOSIS — R7881 Bacteremia: Secondary | ICD-10-CM | POA: Diagnosis not present

## 2020-11-02 DIAGNOSIS — J9612 Chronic respiratory failure with hypercapnia: Secondary | ICD-10-CM | POA: Diagnosis not present

## 2020-11-02 DIAGNOSIS — B9561 Methicillin susceptible Staphylococcus aureus infection as the cause of diseases classified elsewhere: Secondary | ICD-10-CM | POA: Diagnosis not present

## 2020-11-02 NOTE — Telephone Encounter (Signed)
10:31AM: Palliative care SW outreached patient for monthly telephonic visit.  SW unable to LVM due to VM being full.  Will continue to offer palliative care support.

## 2020-11-12 DIAGNOSIS — J449 Chronic obstructive pulmonary disease, unspecified: Secondary | ICD-10-CM | POA: Diagnosis not present

## 2020-11-12 DIAGNOSIS — J45909 Unspecified asthma, uncomplicated: Secondary | ICD-10-CM | POA: Diagnosis not present

## 2020-11-13 ENCOUNTER — Telehealth: Payer: Self-pay | Admitting: Pulmonary Disease

## 2020-11-14 NOTE — Telephone Encounter (Signed)
Called and spoke with patient.  She states she has called [employer] and they said they would replace the machine. She hasn't gotten it. She has also sent to [facility] to see if the machine ended up there. [facility] does not have it at this time. Patient was advised to keep in contact with [facility] and she said she would do that. Nothing further needed at this time.

## 2020-11-22 ENCOUNTER — Other Ambulatory Visit: Payer: Self-pay

## 2020-11-22 ENCOUNTER — Ambulatory Visit (INDEPENDENT_AMBULATORY_CARE_PROVIDER_SITE_OTHER): Payer: Medicare HMO | Admitting: Pulmonary Disease

## 2020-11-22 ENCOUNTER — Encounter: Payer: Self-pay | Admitting: Pulmonary Disease

## 2020-11-22 DIAGNOSIS — I2781 Cor pulmonale (chronic): Secondary | ICD-10-CM

## 2020-11-22 DIAGNOSIS — J9611 Chronic respiratory failure with hypoxia: Secondary | ICD-10-CM | POA: Diagnosis not present

## 2020-11-22 DIAGNOSIS — J432 Centrilobular emphysema: Secondary | ICD-10-CM

## 2020-11-22 MED ORDER — PREDNISONE 5 MG PO TABS
5.0000 mg | ORAL_TABLET | Freq: Every day | ORAL | 1 refills | Status: DC
Start: 2020-11-22 — End: 2021-04-10

## 2020-11-22 NOTE — Assessment & Plan Note (Signed)
Decrease prednisone to 5 mg daily-we will send prescription for 60 tablets with 1 refill -wonder if leg swelling is related to prednisone  For rash, stop taking Daliresp

## 2020-11-22 NOTE — Progress Notes (Signed)
   Subjective:    Patient ID: Jasmine [name], female    DOB: 02-03-1946, 75 y.o.   MRN: [id number]  HPI  75 yo ex- smoker followed for very severe COPD, chronic hypoxic and hypercarbic respiratory failure and lung nodules. She is on nocturnal trilogy device. She has been oxygen dependent since March 2014. She is maintained on 3 L of oxygen at rest and 4 L of oxygen with activity History of polysubstance abuse with cocaine   ABG show acute on chronic hypercarbic respiratory failure, compensated state is 7.37/80 range Ambulatory saturation 08/2020 -3 L/min at rest and 4 L on walking   Chief Complaint  Patient presents with  . Follow-up    SOB since using CPAP at night   She arrives in on her POC setting for with saturation 91%. She tells me she has stopped using her NIV when she heard about the recall.  She reports increasing bipedal edema. She shows me a rash over her arms and legs which is a scaling with pigmentation. She remains on prednisone 10 mg over the past few months. She has tolerated Trelegy very well, she remains on Daliresp  Significant tests/ events reviewed  CT chest 10/2011 >>showed Moderate centrilobular emphysema. Small bilateral pleural effusions with dependent atelectasis  CT chest 07/2016-resolving right upper lobe consolidation,spiculated subcentimeter nodules that have been noted before with low-grade PET hypermetabolism CT chest February 2019 stable right upper lobe scarring, stable bilateral pulmonary nodules, emphysema  CT chest 11/2018>>stable nodules,unchanged bandlike scarring in the inferior right upper lobe stable  PFT >FEV1 at 0.47 L( 30%), ratio 39 , No BD response (<200cc)   Review of Systems neg for any significant sore throat, dysphagia, itching, sneezing, nasal congestion or excess/ purulent secretions, fever, chills, sweats, unintended wt loss, pleuritic or exertional cp, hempoptysis, orthopnea pnd or change in chronic leg  swelling. Also denies presyncope, palpitations, heartburn, abdominal pain, nausea, vomiting, diarrhea or change in bowel or urinary habits, dysuria,hematuria, rash, arthralgias, visual complaints, headache, numbness weakness or ataxia.     Objective:   Physical Exam  Gen. Pleasant, elderly,, in no distress, normal affect ENT - no pallor,icterus, no post nasal drip Neck: No JVD, no thyromegaly, no carotid bruits Lungs: no use of accessory muscles, no dullness to percussion, clear without rales or rhonchi  Cardiovascular: Rhythm regular, heart sounds  normal, no murmurs or gallops, 2+ peripheral edema Abdomen: soft and non-tender, no hepatosplenomegaly, BS normal. Musculoskeletal: No deformities, no cyanosis or clubbing Neuro:  alert, non focal Skin-scaling and hyperpigmentation over arms                         Assessment & Plan:    Assessment:   . 1 or more chronic illnesses with severe exacerbation, progression, or side effects of treatment;  . 1 acute or chronic illness or injury that poses a threat to life or bodily function  Plan Following Extensive Data Review & Interpretation:  . I reviewed prior external note(s) from APP, palliative care . I reviewed the result(s) of prior ABG . I have ordered oxygen, and IV changes, increase Lasix  Independent interpretation of tests . Review of patient's chest x-ray 03/2020 images revealed hyperinflation and emphysema. The patient's images have been independently reviewed by me.    Discussion of management or test interpretation with another colleague PCP.

## 2020-11-22 NOTE — Patient Instructions (Signed)
Several issues were addressed today:  Decrease prednisone to 5 mg daily-we will send prescription for 60 tablets with 1 refill  For rash, stop taking Daliresp  Get back on your [place] BiPAP machine. Use 3 L continuous at home and 4 L while walking.  On your POC, use setting 5  For leg swelling, take additional dose of Lasix 40 mg around 5 PM for 2 weeks

## 2020-11-22 NOTE — Assessment & Plan Note (Signed)
Increasing bipedal edema could be related to increased prednisone usage, or cor pulmonale from respiratory failure due to stopping of NIV or chronic diastolic heart failure. Increase Lasix take second dose at 5 PM for 2 weeks and then reassess

## 2020-11-22 NOTE — Assessment & Plan Note (Addendum)
Get back on your [place] BiPAP machine.  We have to weigh the risk of untreated resp failiure  if he stops using the machine versus the risk of using the machine.  Using NIV and chronic steroids we have been able to decrease the frequency of her hospitalizations  Use 3 L continuous at home and 4 L while walking.  On your POC, use setting 5  For leg swelling, take additional dose of Lasix 40 mg around 5 PM for 2 weeks

## 2020-11-27 DIAGNOSIS — J9612 Chronic respiratory failure with hypercapnia: Secondary | ICD-10-CM | POA: Diagnosis not present

## 2020-11-27 DIAGNOSIS — R7881 Bacteremia: Secondary | ICD-10-CM | POA: Diagnosis not present

## 2020-11-27 DIAGNOSIS — B9561 Methicillin susceptible Staphylococcus aureus infection as the cause of diseases classified elsewhere: Secondary | ICD-10-CM | POA: Diagnosis not present

## 2020-11-27 DIAGNOSIS — J449 Chronic obstructive pulmonary disease, unspecified: Secondary | ICD-10-CM | POA: Diagnosis not present

## 2020-11-28 ENCOUNTER — Other Ambulatory Visit: Payer: Self-pay

## 2020-11-28 ENCOUNTER — Telehealth: Payer: Self-pay

## 2020-11-28 NOTE — Telephone Encounter (Signed)
1027 am.  Phone call made to patient to complete a telephonic visit.  Received voice mail but unable to leave a message as voice mail is full.  1028 am.  Phone call made to [facility] contact.  Cell number listed has been disconnected.

## 2020-11-28 NOTE — Patient Outreach (Signed)
[facility]) Care Management  11/28/2020  Jasmine [name] [birth year] [id number]   Nurseline message: Caller's Name: [name] Phone number: [phone]  *DOB not provided in forwarded message*  Attempted to contact patient regarding message received: "I need authorization for ventilator that patient is using". There was no answer. Unable to leave VM due to full mailbox  CM attempted to call patient for follow up as well.  No answer.  Voice mail full.  Called emergency contact [name].  Line disconnected.    Plan: RN CM will send letter and wait return call.  If no return call will follow up at next scheduled call.   [name], RN, MSN [place] [facility] Care Management Coordinator Direct Line [phone] Cell [phone] Toll Free: [phone]  Fax: [phone]

## 2020-12-03 DIAGNOSIS — J449 Chronic obstructive pulmonary disease, unspecified: Secondary | ICD-10-CM | POA: Diagnosis not present

## 2020-12-03 DIAGNOSIS — R7881 Bacteremia: Secondary | ICD-10-CM | POA: Diagnosis not present

## 2020-12-03 DIAGNOSIS — J9612 Chronic respiratory failure with hypercapnia: Secondary | ICD-10-CM | POA: Diagnosis not present

## 2020-12-03 DIAGNOSIS — B9561 Methicillin susceptible Staphylococcus aureus infection as the cause of diseases classified elsewhere: Secondary | ICD-10-CM | POA: Diagnosis not present

## 2020-12-11 ENCOUNTER — Other Ambulatory Visit: Payer: Self-pay | Admitting: Internal Medicine

## 2020-12-13 DIAGNOSIS — J449 Chronic obstructive pulmonary disease, unspecified: Secondary | ICD-10-CM | POA: Diagnosis not present

## 2020-12-13 DIAGNOSIS — J45909 Unspecified asthma, uncomplicated: Secondary | ICD-10-CM | POA: Diagnosis not present

## 2020-12-20 ENCOUNTER — Other Ambulatory Visit: Payer: Self-pay

## 2020-12-20 ENCOUNTER — Encounter: Payer: Self-pay | Admitting: Adult Health

## 2020-12-20 ENCOUNTER — Ambulatory Visit (INDEPENDENT_AMBULATORY_CARE_PROVIDER_SITE_OTHER): Payer: Medicare HMO | Admitting: Adult Health

## 2020-12-20 ENCOUNTER — Ambulatory Visit (INDEPENDENT_AMBULATORY_CARE_PROVIDER_SITE_OTHER): Payer: Medicare HMO

## 2020-12-20 VITALS — BP 100/60 | HR 108 | Temp 97.2°F | Ht 60.0 in | Wt 124.8 lb

## 2020-12-20 DIAGNOSIS — J449 Chronic obstructive pulmonary disease, unspecified: Secondary | ICD-10-CM | POA: Diagnosis not present

## 2020-12-20 DIAGNOSIS — M7989 Other specified soft tissue disorders: Secondary | ICD-10-CM | POA: Diagnosis not present

## 2020-12-20 DIAGNOSIS — I5032 Chronic diastolic (congestive) heart failure: Secondary | ICD-10-CM

## 2020-12-20 DIAGNOSIS — E038 Other specified hypothyroidism: Secondary | ICD-10-CM

## 2020-12-20 DIAGNOSIS — R0600 Dyspnea, unspecified: Secondary | ICD-10-CM

## 2020-12-20 DIAGNOSIS — J9611 Chronic respiratory failure with hypoxia: Secondary | ICD-10-CM | POA: Diagnosis not present

## 2020-12-20 DIAGNOSIS — R0609 Other forms of dyspnea: Secondary | ICD-10-CM

## 2020-12-20 DIAGNOSIS — J439 Emphysema, unspecified: Secondary | ICD-10-CM | POA: Diagnosis not present

## 2020-12-20 NOTE — Assessment & Plan Note (Signed)
Appears decompensated with lower extremity edema.  Despite increased Lasix dose.  Check labs including the mat and BNP.  Also TSH. Chest x-ray today. Have encouraged her to use her BiPAP/NIV  all night and during daytime if she takes a nap.  This may help as well.   Plan  Patient Instructions  Remain on Lasix 40mg  Twice daily   Labs today and Chest xray .  Continue on Trelegy 1 puff daily.  May use Duoneb nebs every 6hr As needed  Wheezing /shortness of breath .  Continue on non-invasive Vent (TRILOGY )  At bedtime  And with naps. - Wear all night long.  Continue on Oxygen 3l/m rest and 4l/m with activity .  Continue on Prednisone 5mg  daily  Follow up Dr. Elsworth Soho  In 6-8 weeks and As needed   Please contact office for sooner follow up if symptoms do not improve or worsen or seek emergency care

## 2020-12-20 NOTE — Progress Notes (Signed)
@Patient  ID: Jasmine [name], female    DOB: [birth year], 75 y.o.   MRN: [id number]  Chief Complaint  Patient presents with  . Follow-up    Referring provider: Biagio Borg, MD  HPI: 75 year old female former smoker followed for very severe COPD, chronic hypoxic and hypercarbic respiratory failure, lung nodules She is managed on aggressive regimen with nocturnal trilogy device and chronic steroids with prednisone 10 mg daily Maintained on oxygen since March 2014 currently on 3 L at rest and 4 L with activity. History of polysubstance abuse with cocaine  TEST/EVENTS :  CT chest 10/2011 >>showed Moderate centrilobular emphysema. Small bilateral pleural effusions with dependent atelectasis  CT chest 07/2016-resolving right upper lobe consolidation,spiculated subcentimeter nodules that have been noted before with low-grade PET hypermetabolism CT chest February 2019 stable right upper lobe scarring, stable bilateral pulmonary nodules, emphysema  CT chest 11/2018>>stable nodules,unchanged bandlike scarring in the inferior right upper lobe stable Stable small pulmonary nodules, likely benign sequelae of prior infection. No further routine CT follow-up is required for these Nodules.  PFT >FEV1 at 0.47 L( 30%), ratio 39 , No BD response (<200cc)  12/20/2020 Follow up : COPD , O2 RF  Patient returns for a 1 month follow-up.  During last visit patient was having increased shortness of breath and lower extremity swelling.  She had been off of her trilogy device over the last several weeks due to national recall.. Patient was recently provided with a new noninvasive device in the last week.  She says she has recently started wearing this the last few days.  It is helping some but she continues to have lower extremity swelling.  She is on Lasix 40 mg twice daily.  This was increased to twice daily last visit.  She says she has seen a little bit decrease in her leg swelling but still has  quite a bit.  Her dose of prednisone was decreased to 5 mg in case this was contributing to lower extremity edema.  She denies any cough fever or hemoptysis.  Does get short of breath with minimal activity.  Remains on oxygen3 L at rest and 4 L with activity.  Patient is on a portable oxygen concentrator today and O2 saturations are above 90%.   Allergies  Allergen Reactions  . Alendronate Sodium Other (See Comments)    Patient does not remember this reaction    Immunization History  Administered Date(s) Administered  . Fluad Quad(high Dose 65+) 08/30/2019, 08/14/2020  . Influenza Split 07/10/2011, 08/18/2012  . Influenza Whole 07/21/2008, 08/23/2009, 10/10/2010  . Influenza, High Dose Seasonal PF 07/10/2016, 09/03/2017  . Influenza,inj,Quad PF,6+ Mos 08/12/2013, 07/10/2014, 07/08/2018  . PFIZER(Purple Top)SARS-COV-2 Vaccination 12/24/2019, 01/14/2020, 11/22/2020  . Pneumococcal Conjugate-13 11/30/2014  . Pneumococcal Polysaccharide-23 07/21/2007, 01/12/2013, 06/16/2018  . Td 08/23/2009  . Zoster 07/20/2006    Past Medical History:  Diagnosis Date  . ALLERGIC RHINITIS 05/01/2007  . Anxiety   . Arthritis    "hands" (12/28/2015)  . ASTHMA 05/01/2007   "since I was a child"  . Cancer (HCC)    LUNG  . CHF (congestive heart failure) ([place])   . Chronic bronchitis ([place])   . COPD (chronic obstructive pulmonary disease) ([place])   . DEPRESSION 07/21/2008  . DVT (deep venous thrombosis) (HCC) 1960s   LLE  . FATIGUE 10/10/2010  . HYPERTENSION 05/01/2007  . HYPOTHYROIDISM 08/23/2009   s/p RAI  . LUMBAR RADICULOPATHY, RIGHT 08/25/2008  . On home oxygen therapy    "3-4L; qd;  all the time" (12/28/2015)  . OSTEOPOROSIS 05/01/2007  . SHOULDER PAIN, LEFT 08/23/2009  . SINUSITIS, CHRONIC 10/10/2010    Tobacco History: Social History   Tobacco Use  Smoking Status Former Smoker  . Packs/day: 1.00  . Years: 52.00  . Pack years: 52.00  . Types: Cigarettes  . Start date: 6  . Quit date:  07/20/2016  . Years since quitting: 4.4  Smokeless Tobacco Never Used   Counseling given: Not Answered   Outpatient Medications Prior to Visit  Medication Sig Dispense Refill  . albuterol (PROVENTIL) (2.5 MG/3ML) 0.083% nebulizer solution Take 3 mLs (2.5 mg total) by nebulization every 6 (six) hours as needed for wheezing or shortness of breath. 75 mL 0  . albuterol (VENTOLIN HFA) 108 (90 Base) MCG/ACT inhaler INHALE 1 TO 2 PUFFS INTO THE LUNGS EVERY 6 HOURS AS NEEDED FOR WHEEZING OR SHORTNESS OF BREATH. 18 g 5  . aspirin EC 81 MG EC tablet Take 1 tablet (81 mg total) by mouth daily. 30 tablet 0  . atorvastatin (LIPITOR) 10 MG tablet TAKE 1 TABLET (10 MG TOTAL) BY MOUTH DAILY. 90 tablet 1  . citalopram (CELEXA) 20 MG tablet TAKE ONE TABLET BY MOUTH DAILY 90 tablet 1  . Fluticasone-Umeclidin-Vilant (TRELEGY ELLIPTA) 200-62.5-25 MCG/INH AEPB Inhale 1 puff into the lungs daily. 60 each 3  . furosemide (LASIX) 40 MG tablet NEW PRESCRIPTION REQUEST: FUROSEMIDE 40MG  - TAKE ONE TABLET BY MOUTH IN THE MORNING AND TAKE ONE TABLET BY MOUTH AS NEEDED IN THE EVENING (Patient taking differently: Take 40 mg by mouth 2 (two) times daily.) 180 tablet 3  . ipratropium-albuterol (DUONEB) 0.5-2.5 (3) MG/3ML SOLN Take 3 mLs by nebulization every 6 (six) hours as needed. 360 mL 11  . levothyroxine (SYNTHROID) 75 MCG tablet NEW PRESCRIPTION REQUEST: LEVOTHYROXINE 75MCG - TAKE ONE TABLET BY MOUTH EARLY MORNING (Patient taking differently: Take 75 mcg by mouth daily.) 90 tablet 3  . OXYGEN Inhale 3 L into the lungs. 24/7    . potassium chloride (KLOR-CON) 10 MEQ tablet TAKE 1 TABLET BY MOUTH 2 TIMES DAILY. 180 tablet 3  . predniSONE (DELTASONE) 5 MG tablet Take 1 tablet (5 mg total) by mouth daily with breakfast. 60 tablet 1  . predniSONE (DELTASONE) 10 MG tablet TAKE 1 TABLET BY MOUTH DAILY. (Patient not taking: Reported on 12/20/2020) 60 tablet 1  . traMADol (ULTRAM) 50 MG tablet Take 1 tablet (50 mg total) by mouth 2  (two) times daily as needed. (Patient not taking: Reported on 12/20/2020) 60 tablet 2  . Vitamin D, Ergocalciferol, (DRISDOL) 1.25 MG (50000 UNIT) CAPS capsule Take 1 capsule (50,000 Units total) by mouth every 7 (seven) days. (Patient not taking: Reported on 12/20/2020) 12 capsule 0   No facility-administered medications prior to visit.     Review of Systems:   Constitutional:   No  weight loss, night sweats,  Fevers, chills,  +fatigue, or  lassitude.  HEENT:   No headaches,  Difficulty swallowing,  Tooth/dental problems, or  Sore throat,                No sneezing, itching, ear ache, nasal congestion, post nasal drip,   CV:  No chest pain,  Orthopnea, PND, swelling in lower extremities, anasarca, dizziness, palpitations, syncope.   GI  No heartburn, indigestion, abdominal pain, nausea, vomiting, diarrhea, change in bowel habits, loss of appetite, bloody stools.   Resp:  No chest wall deformity  Skin: no rash or lesions.  GU: no dysuria,  change in color of urine, no urgency or frequency.  No flank pain, no hematuria   MS:  No joint pain or swelling.  No decreased range of motion.  No back pain.    Physical Exam  BP 100/60 (BP Location: Left Arm, Patient Position: Sitting, Cuff Size: Normal)   Pulse (!) 108   Temp (!) 97.2 F (36.2 C) (Temporal)   Ht 5' (1.524 m)   Wt 124 lb 12.8 oz (56.6 kg)   SpO2 92%   BMI 24.37 kg/m   GEN: A/Ox3; pleasant , NAD, elderly, chronically ill-appearing on oxygen   HEENT:  [place]/AT,    NOSE-clear, THROAT-clear, no lesions, no postnasal drip or exudate noted.   NECK:  Supple w/ fair ROM; no JVD; normal carotid impulses w/o bruits; no thyromegaly or nodules palpated; no lymphadenopathy.    RESP  Clear  P & A; w/o, wheezes/ rales/ or rhonchi. no accessory muscle use, no dullness to percussion  CARD:  RRR, no m/r/g, bilateral 2+ peripheral edema, pulses intact, no cyanosis or clubbing.  GI:   Soft & nt; nml bowel sounds; no organomegaly or masses  detected.   Musco: Warm bil, no deformities or joint swelling noted.   Neuro: alert, no focal deficits noted.    Skin: Warm, no lesions or rashes    Lab Results:  BMET  BNP  Imaging: DG Chest 2 View  Result Date: 12/20/2020 CLINICAL DATA:  Dyspnea leg swelling EXAM: CHEST - 2 VIEW COMPARISON:  03/29/2020, CT 11/24/2018, chest x-ray 03/06/2019 FINDINGS: Emphysematous disease. Possible tiny pleural effusions. Diffusely increased interstitial opacity. Stable cardiomediastinal silhouette. Mild central congestion. Aortic atherosclerosis. No pneumothorax. Chronic scarring in the right upper lobe. IMPRESSION: Emphysematous disease. Diffusely increased interstitial opacity suspicious for acute interstitial edema or inflammation. Scarring in the right upper lobe. Possible trace pleural effusion. Electronically Signed   By: Donavan Foil M.D.   On: 12/20/2020 16:52      No flowsheet data found.  No results found for: NITRICOXIDE      Assessment & Plan:   Chronic diastolic congestive heart failure ([place]) Appears decompensated with lower extremity edema.  Despite increased Lasix dose.  Check labs including the mat and BNP.  Also TSH. Chest x-ray today. Have encouraged her to use her BiPAP/NIV  all night and during daytime if she takes a nap.  This may help as well.   Plan  Patient Instructions  Remain on Lasix 40mg  Twice daily   Labs today and Chest xray .  Continue on Trelegy 1 puff daily.  May use Duoneb nebs every 6hr As needed  Wheezing /shortness of breath .  Continue on non-invasive Vent (TRILOGY )  At bedtime  And with naps. - Wear all night long.  Continue on Oxygen 3l/m rest and 4l/m with activity .  Continue on Prednisone 5mg  daily  Follow up Dr. Elsworth Soho  In 6-8 weeks and As needed   Please contact office for sooner follow up if symptoms do not improve or worsen or seek emergency care                Chronic respiratory failure with hypoxia ([place]) Chronic  hypoxic and hypercarbic respiratory failure.  Patient O2 is doing well.  No increased oxygen mans.  Patient is encouraged on her noninvasive ventilator support compliance.  Plan  Patient Instructions  Remain on Lasix 40mg  Twice daily   Labs today and Chest xray .  Continue on Trelegy 1 puff daily.  May use Duoneb  nebs every 6hr As needed  Wheezing /shortness of breath .  Continue on non-invasive Vent (TRILOGY )  At bedtime  And with naps. - Wear all night long.  Continue on Oxygen 3l/m rest and 4l/m with activity .  Continue on Prednisone 5mg  daily  Follow up Dr. Elsworth Soho  In 6-8 weeks and As needed   Please contact office for sooner follow up if symptoms do not improve or worsen or seek emergency care                COPD (chronic obstructive pulmonary disease) ([place]) Patient is continue on her current regimen.  With triple therapy Check chest x-ray today  Plan  . Patient Instructions  Remain on Lasix 40mg  Twice daily   Labs today and Chest xray .  Continue on Trelegy 1 puff daily.  May use Duoneb nebs every 6hr As needed  Wheezing /shortness of breath .  Continue on non-invasive Vent (TRILOGY )  At bedtime  And with naps. - Wear all night long.  Continue on Oxygen 3l/m rest and 4l/m with activity .  Continue on Prednisone 5mg  daily  Follow up Dr. Elsworth Soho  In 6-8 weeks and As needed   Please contact office for sooner follow up if symptoms do not improve or worsen or seek emergency care                    Rexene Edison, NP 12/20/2020

## 2020-12-20 NOTE — Assessment & Plan Note (Signed)
Patient is continue on her current regimen.  With triple therapy Check chest x-ray today  Plan  . Patient Instructions  Remain on Lasix 40mg  Twice daily   Labs today and Chest xray .  Continue on Trelegy 1 puff daily.  May use Duoneb nebs every 6hr As needed  Wheezing /shortness of breath .  Continue on non-invasive Vent (TRILOGY )  At bedtime  And with naps. - Wear all night long.  Continue on Oxygen 3l/m rest and 4l/m with activity .  Continue on Prednisone 5mg  daily  Follow up Dr. Elsworth Soho  In 6-8 weeks and As needed   Please contact office for sooner follow up if symptoms do not improve or worsen or seek emergency care

## 2020-12-20 NOTE — Patient Instructions (Addendum)
Remain on Lasix 40mg  Twice daily   Labs today and Chest xray .  Continue on Trelegy 1 puff daily.  May use Duoneb nebs every 6hr As needed  Wheezing /shortness of breath .  Continue on non-invasive Vent (TRILOGY )  At bedtime  And with naps. - Wear all night long.  Continue on Oxygen 3l/m rest and 4l/m with activity .  Continue on Prednisone 5mg  daily  Follow up Dr. Elsworth Soho  In 6-8 weeks and As needed   Please contact office for sooner follow up if symptoms do not improve or worsen or seek emergency care

## 2020-12-20 NOTE — Assessment & Plan Note (Signed)
Chronic hypoxic and hypercarbic respiratory failure.  Patient O2 is doing well.  No increased oxygen mans.  Patient is encouraged on her noninvasive ventilator support compliance.  Plan  Patient Instructions  Remain on Lasix 40mg  Twice daily   Labs today and Chest xray .  Continue on Trelegy 1 puff daily.  May use Duoneb nebs every 6hr As needed  Wheezing /shortness of breath .  Continue on non-invasive Vent (TRILOGY )  At bedtime  And with naps. - Wear all night long.  Continue on Oxygen 3l/m rest and 4l/m with activity .  Continue on Prednisone 5mg  daily  Follow up Dr. Elsworth Soho  In 6-8 weeks and As needed   Please contact office for sooner follow up if symptoms do not improve or worsen or seek emergency care

## 2020-12-21 NOTE — Progress Notes (Signed)
ATC x2 at 12:14 pm, called mobile #, went straight to VM, vm full.  Tried work #, [employer] operational [employer] rang and then went to fast busy signal.  Will try again later today.

## 2020-12-21 NOTE — Progress Notes (Signed)
ATC patient x1 on mobile/home #, went straight to VM, VM was full.  Unable to leave a message.  Will try later today.

## 2020-12-24 ENCOUNTER — Encounter: Payer: Self-pay | Admitting: *Deleted

## 2020-12-24 ENCOUNTER — Telehealth: Payer: Self-pay | Admitting: Pulmonary Disease

## 2020-12-24 NOTE — Progress Notes (Signed)
ATC patient, VM full, unable to leave a message.  Letter sent.

## 2020-12-24 NOTE — Progress Notes (Signed)
Unable to reach patient by phone, VM is full.  Unable to reach letter sent.  Patient was supposed to either go to [place] on Friday, 12/21/20 or come to our office today to have her labs drawn.  I do not see that the labs have been drawn as of yet today.

## 2020-12-25 NOTE — Telephone Encounter (Signed)
Nothing noted in message. Will close encounter.  

## 2020-12-27 ENCOUNTER — Other Ambulatory Visit: Payer: Self-pay

## 2020-12-27 NOTE — Patient Outreach (Signed)
[facility]) Care Management  12/27/2020  ANAJAH STERBENZ 1946/10/02 [id number]   Telephone call to patient for disease management follow up.   No answer.  HIPAA unable to leave a voice message.    Plan: If no return call, RN CM will attempt patient again in the month of June.  [name], RN, MSN [place] Management Care Management Coordinator Direct Line [phone] Cell [phone] Toll Free: [phone]  Fax: [phone]

## 2020-12-28 ENCOUNTER — Telehealth: Payer: Self-pay | Admitting: Pulmonary Disease

## 2020-12-28 NOTE — Telephone Encounter (Signed)
Called and spoke with patient to go over CXR results and you let her know that she needs to come in and get her labs done asap. She expressed understanding and said that she is taking 40 mg lasix twice a day in the morning and afternoon. Patient states she will come on Wednesday to get labs done. Will route to Tammy as FYI. Nothing further needed at this time.

## 2021-01-02 ENCOUNTER — Other Ambulatory Visit (INDEPENDENT_AMBULATORY_CARE_PROVIDER_SITE_OTHER): Payer: Medicare HMO

## 2021-01-02 DIAGNOSIS — J449 Chronic obstructive pulmonary disease, unspecified: Secondary | ICD-10-CM

## 2021-01-02 DIAGNOSIS — R0609 Other forms of dyspnea: Secondary | ICD-10-CM

## 2021-01-02 DIAGNOSIS — E038 Other specified hypothyroidism: Secondary | ICD-10-CM

## 2021-01-02 DIAGNOSIS — R0600 Dyspnea, unspecified: Secondary | ICD-10-CM

## 2021-01-03 LAB — CBC WITH DIFFERENTIAL/PLATELET
Basophils Absolute: 0 10*3/uL (ref 0.0–0.1)
Basophils Relative: 0.9 % (ref 0.0–3.0)
Eosinophils Absolute: 0.1 10*3/uL (ref 0.0–0.7)
Eosinophils Relative: 2.7 % (ref 0.0–5.0)
HCT: 39.9 % (ref 36.0–46.0)
Hemoglobin: 13.1 g/dL (ref 12.0–15.0)
Lymphocytes Relative: 20.8 % (ref 12.0–46.0)
Lymphs Abs: 1 10*3/uL (ref 0.7–4.0)
MCHC: 32.8 g/dL (ref 30.0–36.0)
MCV: 97.6 fl (ref 78.0–100.0)
Monocytes Absolute: 0.6 10*3/uL (ref 0.1–1.0)
Monocytes Relative: 11.6 % (ref 3.0–12.0)
Neutro Abs: 3.1 10*3/uL (ref 1.4–7.7)
Neutrophils Relative %: 64 % (ref 43.0–77.0)
Platelets: 180 10*3/uL (ref 150.0–400.0)
RBC: 4.09 Mil/uL (ref 3.87–5.11)
RDW: 13.5 % (ref 11.5–15.5)
WBC: 4.9 10*3/uL (ref 4.0–10.5)

## 2021-01-03 LAB — BASIC METABOLIC PANEL
BUN: 10 mg/dL (ref 6–23)
CO2: 41 mEq/L — ABNORMAL HIGH (ref 19–32)
Calcium: 9.6 mg/dL (ref 8.4–10.5)
Chloride: 91 mEq/L — ABNORMAL LOW (ref 96–112)
Creatinine, Ser: 0.74 mg/dL (ref 0.40–1.20)
GFR: 78.74 mL/min (ref >60.00)
Glucose, Bld: 124 mg/dL — ABNORMAL HIGH (ref 70–99)
Potassium: 4 mEq/L (ref 3.5–5.1)
Sodium: 140 mEq/L (ref 135–145)

## 2021-01-03 LAB — TSH: TSH: 2.57 u[IU]/mL (ref 0.35–4.50)

## 2021-01-03 LAB — BRAIN NATRIURETIC PEPTIDE: Pro B Natriuretic peptide (BNP): 19 pg/mL (ref 0.0–100.0)

## 2021-01-08 ENCOUNTER — Telehealth: Payer: Self-pay

## 2021-01-08 NOTE — Telephone Encounter (Signed)
11:30AM: Palliative care SW outreached patient/family for monthly telephonic visit.  Call unsuccessful. SW unable to LVM due to mailbox being full.    Will continue to offer palliative care support.

## 2021-01-21 ENCOUNTER — Telehealth: Payer: Self-pay

## 2021-01-21 ENCOUNTER — Telehealth: Payer: Self-pay | Admitting: Pulmonary Disease

## 2021-01-21 NOTE — Telephone Encounter (Signed)
933 am.  Phone call made to patient to offer a home visit or telephonic visit.  No answer and unable to leave a message as VM is full.  Phone call made to [employer] other and HIPPA compliant message has been left.  PLAN:  Awaiting call back.  If no call back, Palliative Care will outreach patient at a later date.

## 2021-01-21 NOTE — Telephone Encounter (Signed)
Called and spoke with patient who is calling for her lab results from 3/16  Tammy please advise

## 2021-01-21 NOTE — Telephone Encounter (Signed)
Called and spoke with pt and she is aware of lab results per TP.  Pt stated that she is now using the cpap machine more often but not every night. Pt is aware of her appt with RA on 04/18.

## 2021-01-21 NOTE — Telephone Encounter (Signed)
Congestive heart failure marker was normal Kidney function was normal Bicarb level was slightly improved consistent with chronic COPD CBC was normal Continue with office visit recommendations and follow-up

## 2021-01-31 DIAGNOSIS — R7881 Bacteremia: Secondary | ICD-10-CM | POA: Diagnosis not present

## 2021-01-31 DIAGNOSIS — J449 Chronic obstructive pulmonary disease, unspecified: Secondary | ICD-10-CM | POA: Diagnosis not present

## 2021-01-31 DIAGNOSIS — B9561 Methicillin susceptible Staphylococcus aureus infection as the cause of diseases classified elsewhere: Secondary | ICD-10-CM | POA: Diagnosis not present

## 2021-01-31 DIAGNOSIS — J9612 Chronic respiratory failure with hypercapnia: Secondary | ICD-10-CM | POA: Diagnosis not present

## 2021-02-04 ENCOUNTER — Ambulatory Visit: Payer: Medicare HMO | Admitting: Pulmonary Disease

## 2021-02-20 NOTE — Progress Notes (Signed)
75 y.o. G5P0050 Significant Other Black or African American Not Hispanic or Latino female here for annual exam.   No vaginal bleeding. She has a slight brown vaginal discharge for years. No itching, burning or irritation. No odor. Not sexually active.  No bowel or bladder c/o. She has occasional urge incontinence with a full bladder. Happens a couple of times a week, small amounts. It is a change.     She has had a lot of deaths in her family (sister, 2 nephews, plus other family members)  No LMP recorded. Patient is postmenopausal.          Sexually active: No.  The current method of family planning is post menopausal status.    Exercising: No.   Smoker:  no  Health Maintenance: Pap:  01/16/16 WNL History of abnormal Pap:  no MMG:  11/15/19 density C Bi-rads 1 neg  BMD:  07/20/2003  Colonoscopy:07/20/2003 WNL, Cologuard 12/17/2018 Negative  TDaP:  09/02/2009  Gardasil: NA   reports that she quit smoking about 4 years ago. Her smoking use included cigarettes. She started smoking about 57 years ago. She has a 52.00 pack-year smoking history. She has never used smokeless tobacco. She reports previous alcohol use. She reports previous drug use.  Past Medical History:  Diagnosis Date  . ALLERGIC RHINITIS 05/01/2007  . Anxiety   . Arthritis    "hands" (12/28/2015)  . ASTHMA 05/01/2007   "since I was a child"  . Cancer (HCC)    LUNG  . CHF (congestive heart failure) ([place])   . Chronic bronchitis ([place])   . COPD (chronic obstructive pulmonary disease) ([place])   . DEPRESSION 07/21/2008  . DVT (deep venous thrombosis) (HCC) 1960s   LLE  . FATIGUE 10/10/2010  . HYPERTENSION 05/01/2007  . HYPOTHYROIDISM 08/23/2009   s/p RAI  . LUMBAR RADICULOPATHY, RIGHT 08/25/2008  . On home oxygen therapy    "3-4L; qd; all the time" (12/28/2015)  . OSTEOPOROSIS 05/01/2007  . SHOULDER PAIN, LEFT 08/23/2009  . SINUSITIS, CHRONIC 10/10/2010    Past Surgical History:  Procedure Laterality Date  . APPENDECTOMY     . DILATION AND CURETTAGE OF UTERUS  multiple   history of multiple dialations and curettages and miscarriages, unfortunately never carrying a child to term  . ECTOPIC PREGNANCY SURGERY  "late '60s or early '70s  . ELECTROCARDIOGRAM  06/20/2006  . OOPHORECTOMY Right     Current Outpatient Medications  Medication Sig Dispense Refill  . albuterol (PROVENTIL) (2.5 MG/3ML) 0.083% nebulizer solution Take 3 mLs (2.5 mg total) by nebulization every 6 (six) hours as needed for wheezing or shortness of breath. 75 mL 0  . albuterol (VENTOLIN HFA) 108 (90 Base) MCG/ACT inhaler INHALE 1 TO 2 PUFFS INTO THE LUNGS EVERY 6 HOURS AS NEEDED FOR WHEEZING OR SHORTNESS OF BREATH. 18 g 5  . aspirin EC 81 MG EC tablet Take 1 tablet (81 mg total) by mouth daily. 30 tablet 0  . atorvastatin (LIPITOR) 10 MG tablet TAKE 1 TABLET (10 MG TOTAL) BY MOUTH DAILY. 90 tablet 1  . citalopram (CELEXA) 20 MG tablet TAKE ONE TABLET BY MOUTH DAILY 90 tablet 1  . Fluticasone-Umeclidin-Vilant (TRELEGY ELLIPTA) 200-62.5-25 MCG/INH AEPB Inhale 1 puff into the lungs daily. 60 each 3  . furosemide (LASIX) 40 MG tablet NEW PRESCRIPTION REQUEST: FUROSEMIDE 40MG  - TAKE ONE TABLET BY MOUTH IN THE MORNING AND TAKE ONE TABLET BY MOUTH AS NEEDED IN THE EVENING (Patient taking differently: Take 40 mg by mouth 2 (  two) times daily.) 180 tablet 3  . ipratropium-albuterol (DUONEB) 0.5-2.5 (3) MG/3ML SOLN Take 3 mLs by nebulization every 6 (six) hours as needed. 360 mL 11  . levothyroxine (SYNTHROID) 75 MCG tablet NEW PRESCRIPTION REQUEST: LEVOTHYROXINE 75MCG - TAKE ONE TABLET BY MOUTH EARLY MORNING (Patient taking differently: Take 75 mcg by mouth daily.) 90 tablet 3  . OXYGEN Inhale 3 L into the lungs. 24/7    . potassium chloride (KLOR-CON) 10 MEQ tablet TAKE 1 TABLET BY MOUTH 2 TIMES DAILY. 180 tablet 3  . predniSONE (DELTASONE) 5 MG tablet Take 1 tablet (5 mg total) by mouth daily with breakfast. 60 tablet 1   No current facility-administered  medications for this visit.    Family History  Problem Relation Age of Onset  . Lung cancer Father   . Alcohol abuse Brother   . Diabetes Brother   . Hypertension Other   . Stroke Mother   . Thyroid disease Mother   . Asthma Other        maternal aunts  . Breast cancer Neg Hx     Review of Systems  All other systems reviewed and are negative.   Exam:   BP 110/62   Pulse 89   Ht 5' (1.524 m)   SpO2 99%   BMI 24.37 kg/m   Weight change: @WEIGHTCHANGE @ Height:   Height: 5' (152.4 cm)  Ht Readings from Last 3 Encounters:  02/25/21 5' (1.524 m)  12/20/20 5' (1.524 m)  11/22/20 5' (1.524 m)    General appearance: alert, cooperative and appears stated age Head: Normocephalic, without obvious abnormality, atraumatic Neck: no adenopathy, supple, symmetrical, trachea midline and thyroid normal to inspection and palpation Breasts: normal appearance, no masses or tenderness Abdomen: soft, non-tender; non distended,  no masses,  no organomegaly Extremities: extremities normal, atraumatic, no cyanosis or edema Skin: Skin color, texture, turgor normal. No rashes or lesions Lymph nodes: Cervical, supraclavicular, and axillary nodes normal. No abnormal inguinal nodes palpated Neurologic: Grossly normal   Pelvic: External genitalia:  no lesions              Urethra:  normal appearing urethra with no masses, tenderness or lesions              Bartholins and Skenes: normal                 Vagina: atrophic appearing vagina with normal color and discharge, no lesions. No brown d/c noted. Looked at her underwear, slight staining, no clear brown d/c.               Cervix: no lesions and stenotic               Bimanual Exam:  Uterus:  no masses or tenderness              Adnexa: no mass, fullness, tenderness               Rectovaginal: Confirms               Anus:  normal sphincter tone, no lesions  Gae Dry chaperoned for the exam.   1. Encounter for gynecological examination  without abnormal finding Mammogram due, she will schedule She will do her DEXA with her primary  2. Urge incontinence Will check her urine after her ultrasound appointment - Urinalysis,Complete w/RFL Culture; Future  3. Postmenopausal bleeding Not clear if she is bleeding. She reports brown spotting, not seen on exam.  - Cytology -  PAP - US PELVIS TRANSVAGINAL NON-OB (TV ONLY); Future  4. Screening for cervical cancer Stenotic os - Cytology - PAP  5. Vaginal atrophy Sometimes atrophy can cause a slight brownish d/c.

## 2021-02-21 ENCOUNTER — Ambulatory Visit: Payer: Medicare HMO | Admitting: Obstetrics and Gynecology

## 2021-02-22 ENCOUNTER — Telehealth: Payer: Self-pay

## 2021-02-22 NOTE — Telephone Encounter (Signed)
249 pm. Phone call made to patient to complete a telephonic visit or in-person visit.  No answer and unable to leave a message as mailbox is full.  PLAN:  Palliative Care will continue to outreach patient monthly.

## 2021-02-25 ENCOUNTER — Ambulatory Visit (INDEPENDENT_AMBULATORY_CARE_PROVIDER_SITE_OTHER): Payer: Medicare HMO | Admitting: Obstetrics and Gynecology

## 2021-02-25 ENCOUNTER — Other Ambulatory Visit: Payer: Self-pay

## 2021-02-25 ENCOUNTER — Other Ambulatory Visit (HOSPITAL_COMMUNITY)
Admission: RE | Admit: 2021-02-25 | Discharge: 2021-02-25 | Disposition: A | Discharge status: Home / Self Care | Payer: Medicare HMO | Source: Ambulatory Visit | Attending: Obstetrics and Gynecology | Admitting: Obstetrics and Gynecology

## 2021-02-25 ENCOUNTER — Encounter: Payer: Self-pay | Admitting: Obstetrics and Gynecology

## 2021-02-25 VITALS — BP 110/62 | HR 89 | Ht 60.0 in

## 2021-02-25 DIAGNOSIS — Z01419 Encounter for gynecological examination (general) (routine) without abnormal findings: Secondary | ICD-10-CM

## 2021-02-25 DIAGNOSIS — N950 Postmenopausal bleeding: Secondary | ICD-10-CM | POA: Insufficient documentation

## 2021-02-25 DIAGNOSIS — Z124 Encounter for screening for malignant neoplasm of cervix: Secondary | ICD-10-CM | POA: Insufficient documentation

## 2021-02-25 DIAGNOSIS — N3941 Urge incontinence: Secondary | ICD-10-CM | POA: Diagnosis not present

## 2021-02-25 DIAGNOSIS — N952 Postmenopausal atrophic vaginitis: Secondary | ICD-10-CM | POA: Diagnosis not present

## 2021-02-28 LAB — CYTOLOGY - PAP: Diagnosis: NEGATIVE

## 2021-03-02 DIAGNOSIS — J9612 Chronic respiratory failure with hypercapnia: Secondary | ICD-10-CM | POA: Diagnosis not present

## 2021-03-02 DIAGNOSIS — J449 Chronic obstructive pulmonary disease, unspecified: Secondary | ICD-10-CM | POA: Diagnosis not present

## 2021-03-02 DIAGNOSIS — B9561 Methicillin susceptible Staphylococcus aureus infection as the cause of diseases classified elsewhere: Secondary | ICD-10-CM | POA: Diagnosis not present

## 2021-03-02 DIAGNOSIS — R7881 Bacteremia: Secondary | ICD-10-CM | POA: Diagnosis not present

## 2021-03-04 ENCOUNTER — Other Ambulatory Visit: Payer: Self-pay | Admitting: Internal Medicine

## 2021-03-04 NOTE — Telephone Encounter (Signed)
Please refill as per office routine med refill policy (all routine meds refilled for 3 mo or monthly per pt preference up to one year from last visit, then month to month grace period for 3 mo, then further med refills will have to be denied)  

## 2021-03-05 ENCOUNTER — Ambulatory Visit: Payer: Medicare HMO | Admitting: Pulmonary Disease

## 2021-03-07 ENCOUNTER — Telehealth: Payer: Self-pay | Admitting: Internal Medicine

## 2021-03-07 NOTE — Telephone Encounter (Signed)
LVM for pt to rtn my call to schedule AWV with [facility]. Please schedule AWV if pt calls the office  

## 2021-03-13 ENCOUNTER — Other Ambulatory Visit: Payer: Medicare HMO

## 2021-03-13 ENCOUNTER — Other Ambulatory Visit: Payer: Medicare HMO | Admitting: Obstetrics and Gynecology

## 2021-03-13 DIAGNOSIS — Z0289 Encounter for other administrative examinations: Secondary | ICD-10-CM

## 2021-03-26 ENCOUNTER — Telehealth: Payer: Self-pay

## 2021-03-26 NOTE — Telephone Encounter (Signed)
1206 pm.  Phone call made to patient's listed numbers.  Unable to leave a VM as mailbox is not set up.  Phone call made to [employer] other and unsuccessful in reaching patient.  HIPPA compliant message left requesting a call back.  Palliative Care will outreach patient at a later date if no call back is received.

## 2021-03-28 ENCOUNTER — Other Ambulatory Visit: Payer: Self-pay | Admitting: Internal Medicine

## 2021-03-28 ENCOUNTER — Other Ambulatory Visit: Payer: Self-pay

## 2021-03-28 NOTE — Patient Outreach (Signed)
[facility]) Care Management  03/28/2021  Jasmine [name] January 30, 1946 [id number]   Telephone call to patient for disease management follow up.   No answer.  HIPAA compliant voice message left.    Plan: If no return call, RN CM will attempt patient again in September.    [name], RN, MSN [place] Management Care Management Coordinator Direct Line [phone] Cell [phone] Toll Free: [phone]  Fax: [phone]

## 2021-04-02 DIAGNOSIS — J449 Chronic obstructive pulmonary disease, unspecified: Secondary | ICD-10-CM | POA: Diagnosis not present

## 2021-04-02 DIAGNOSIS — B9561 Methicillin susceptible Staphylococcus aureus infection as the cause of diseases classified elsewhere: Secondary | ICD-10-CM | POA: Diagnosis not present

## 2021-04-02 DIAGNOSIS — J9612 Chronic respiratory failure with hypercapnia: Secondary | ICD-10-CM | POA: Diagnosis not present

## 2021-04-02 DIAGNOSIS — R7881 Bacteremia: Secondary | ICD-10-CM | POA: Diagnosis not present

## 2021-04-09 ENCOUNTER — Telehealth: Payer: Self-pay

## 2021-04-09 NOTE — Telephone Encounter (Signed)
Palliative care volunteer call made to check in on patient. No answer 

## 2021-04-10 ENCOUNTER — Other Ambulatory Visit: Payer: Self-pay | Admitting: Internal Medicine

## 2021-04-10 ENCOUNTER — Other Ambulatory Visit: Payer: Self-pay | Admitting: Pulmonary Disease

## 2021-04-10 NOTE — Telephone Encounter (Signed)
Please refill as per office routine med refill policy (all routine meds refilled for 3 mo or monthly per pt preference up to one year from last visit, then month to month grace period for 3 mo, then further med refills will have to be denied)  

## 2021-04-17 ENCOUNTER — Other Ambulatory Visit: Payer: Self-pay

## 2021-04-19 ENCOUNTER — Ambulatory Visit (INDEPENDENT_AMBULATORY_CARE_PROVIDER_SITE_OTHER): Payer: Medicare HMO | Admitting: Internal Medicine

## 2021-04-19 ENCOUNTER — Other Ambulatory Visit: Payer: Self-pay

## 2021-04-19 ENCOUNTER — Encounter: Payer: Self-pay | Admitting: Internal Medicine

## 2021-04-19 VITALS — BP 122/60 | HR 79 | Temp 98.2°F | Ht 60.0 in | Wt 130.0 lb

## 2021-04-19 DIAGNOSIS — E538 Deficiency of other specified B group vitamins: Secondary | ICD-10-CM | POA: Diagnosis not present

## 2021-04-19 DIAGNOSIS — R739 Hyperglycemia, unspecified: Secondary | ICD-10-CM

## 2021-04-19 DIAGNOSIS — I509 Heart failure, unspecified: Secondary | ICD-10-CM

## 2021-04-19 DIAGNOSIS — H900 Conductive hearing loss, bilateral: Secondary | ICD-10-CM | POA: Diagnosis not present

## 2021-04-19 DIAGNOSIS — E559 Vitamin D deficiency, unspecified: Secondary | ICD-10-CM | POA: Diagnosis not present

## 2021-04-19 DIAGNOSIS — J9611 Chronic respiratory failure with hypoxia: Secondary | ICD-10-CM

## 2021-04-19 DIAGNOSIS — J432 Centrilobular emphysema: Secondary | ICD-10-CM | POA: Diagnosis not present

## 2021-04-19 DIAGNOSIS — I10 Essential (primary) hypertension: Secondary | ICD-10-CM | POA: Diagnosis not present

## 2021-04-19 DIAGNOSIS — Z23 Encounter for immunization: Secondary | ICD-10-CM

## 2021-04-19 DIAGNOSIS — E039 Hypothyroidism, unspecified: Secondary | ICD-10-CM

## 2021-04-19 DIAGNOSIS — Z0001 Encounter for general adult medical examination with abnormal findings: Secondary | ICD-10-CM | POA: Diagnosis not present

## 2021-04-19 LAB — LIPID PANEL
Cholesterol: 195 mg/dL (ref 0–200)
HDL: 100.3 mg/dL (ref >39.00)
LDL Cholesterol: 86 mg/dL (ref 0–99)
NonHDL: 94.73
Total CHOL/HDL Ratio: 2
Triglycerides: 43 mg/dL (ref 0.0–149.0)
VLDL: 8.6 mg/dL (ref 0.0–40.0)

## 2021-04-19 LAB — CBC WITH DIFFERENTIAL/PLATELET
Basophils Absolute: 0 10*3/uL (ref 0.0–0.1)
Basophils Relative: 0.8 % (ref 0.0–3.0)
Eosinophils Absolute: 0.1 10*3/uL (ref 0.0–0.7)
Eosinophils Relative: 2.5 % (ref 0.0–5.0)
HCT: 41.3 % (ref 36.0–46.0)
Hemoglobin: 13.3 g/dL (ref 12.0–15.0)
Lymphocytes Relative: 22.7 % (ref 12.0–46.0)
Lymphs Abs: 1.3 10*3/uL (ref 0.7–4.0)
MCHC: 32.2 g/dL (ref 30.0–36.0)
MCV: 98.2 fl (ref 78.0–100.0)
Monocytes Absolute: 0.8 10*3/uL (ref 0.1–1.0)
Monocytes Relative: 13.4 % — ABNORMAL HIGH (ref 3.0–12.0)
Neutro Abs: 3.5 10*3/uL (ref 1.4–7.7)
Neutrophils Relative %: 60.6 % (ref 43.0–77.0)
Platelets: 167 10*3/uL (ref 150.0–400.0)
RBC: 4.21 Mil/uL (ref 3.87–5.11)
RDW: 14.4 % (ref 11.5–15.5)
WBC: 5.9 10*3/uL (ref 4.0–10.5)

## 2021-04-19 LAB — BASIC METABOLIC PANEL
BUN: 16 mg/dL (ref 6–23)
CO2: 42 mEq/L — ABNORMAL HIGH (ref 19–32)
Calcium: 9.9 mg/dL (ref 8.4–10.5)
Chloride: 94 mEq/L — ABNORMAL LOW (ref 96–112)
Creatinine, Ser: 0.78 mg/dL (ref 0.40–1.20)
GFR: 73.77 mL/min (ref >60.00)
Glucose, Bld: 104 mg/dL — ABNORMAL HIGH (ref 70–99)
Potassium: 4.6 mEq/L (ref 3.5–5.1)
Sodium: 143 mEq/L (ref 135–145)

## 2021-04-19 LAB — URINALYSIS, ROUTINE W REFLEX MICROSCOPIC
Bilirubin Urine: NEGATIVE
Hgb urine dipstick: NEGATIVE
Ketones, ur: NEGATIVE
Nitrite: NEGATIVE
Specific Gravity, Urine: 1.02 (ref 1.000–1.030)
Urine Glucose: NEGATIVE
Urobilinogen, UA: 1 (ref 0.0–1.0)
pH: 6.5 (ref 5.0–8.0)

## 2021-04-19 LAB — HEPATIC FUNCTION PANEL
ALT: 20 U/L (ref 0–35)
AST: 30 U/L (ref 0–37)
Albumin: 4.3 g/dL (ref 3.5–5.2)
Alkaline Phosphatase: 57 U/L (ref 39–117)
Bilirubin, Direct: 0.2 mg/dL (ref 0.0–0.3)
Total Bilirubin: 0.8 mg/dL (ref 0.2–1.2)
Total Protein: 7.3 g/dL (ref 6.0–8.3)

## 2021-04-19 LAB — MICROALBUMIN / CREATININE URINE RATIO
Creatinine,U: 164.2 mg/dL
Microalb Creat Ratio: 2.4 mg/g (ref 0.0–30.0)
Microalb, Ur: 4 mg/dL — ABNORMAL HIGH (ref 0.0–1.9)

## 2021-04-19 LAB — TSH: TSH: 5.3 u[IU]/mL (ref 0.35–5.50)

## 2021-04-19 LAB — HEMOGLOBIN A1C: Hgb A1c MFr Bld: 6.3 % (ref 4.6–6.5)

## 2021-04-19 LAB — VITAMIN D 25 HYDROXY (VIT D DEFICIENCY, FRACTURES): VITD: 21.99 ng/mL — ABNORMAL LOW (ref 30.00–100.00)

## 2021-04-19 LAB — VITAMIN B12: Vitamin B-12: 480 pg/mL (ref 211–911)

## 2021-04-19 MED ORDER — LEVOTHYROXINE SODIUM 75 MCG PO TABS: ORAL_TABLET | ORAL | 3 refills | Status: DC

## 2021-04-19 MED ORDER — CITALOPRAM HYDROBROMIDE 20 MG PO TABS: ORAL_TABLET | ORAL | 3 refills | Status: DC

## 2021-04-19 MED ORDER — POTASSIUM CHLORIDE ER 10 MEQ PO TBCR: EXTENDED_RELEASE_TABLET | ORAL | 3 refills | Status: DC

## 2021-04-19 MED ORDER — FUROSEMIDE 40 MG PO TABS: ORAL_TABLET | ORAL | 3 refills | Status: DC

## 2021-04-19 MED ORDER — ATORVASTATIN CALCIUM 10 MG PO TABS: ORAL_TABLET | ORAL | 3 refills | Status: DC

## 2021-04-19 MED ORDER — TRELEGY ELLIPTA 200-62.5-25 MCG/INH IN AEPB: 1.0000 | INHALATION_SPRAY | Freq: Every day | RESPIRATORY_TRACT | 11 refills | Status: DC

## 2021-04-19 NOTE — Patient Instructions (Addendum)
You will be contacted regarding the referral for: Endocrinology  Please continue all other medications as before, including the lasix at twice per day  Please have the pharmacy call with any other refills you may need.  Please continue your efforts at being more active, low cholesterol diet, and weight control.  You are otherwise up to date with prevention measures today.  Please keep your appointments with your specialists as you may have planned  Please go to the LAB at the blood drawing area for the tests to be done  You will be contacted by phone if any changes need to be made immediately.  Otherwise, you will receive a letter about your results with an explanation, but please check with MyChart first.  Please remember to sign up for MyChart if you have not done so, as this will be important to you in the future with finding out test results, communicating by private email, and scheduling acute appointments online when needed.  Please make an Appointment to return for 3 month, or sooner if needed

## 2021-04-19 NOTE — Progress Notes (Signed)
Patient ID: Jasmine [name], female   DOB: Dec 24, 1945, 75 y.o.   MRN: [id number]         Chief Complaint:: wellness exam and Follow-up  Leg swelling, copd, bilateral hearing loss       HPI:  Jasmine [name] is a 75 y.o. female here for wellness exam; decliens covid booster #2, shingrix, dxa and tdap; o/w up to date with preventive referrals and immunizations                        Also admits to med non compliance, only using the trelegy intermittent I suspect due to cost; Pt denies chest pain, increased sob or doe, wheezing, orthopnea, PND, palpitations, dizziness or syncope, but does have increased leg swelling for over 3 months, admits to not taking the lasix bid, some days not at all due to the diuretic effect. .   Pt denies polydipsia, polyuria, or new focal neuro s/s   Pt denies fever, wt loss, night sweats, loss of appetite, or other constitutional symptoms  Has new worsening bilateral hearing loss for the past wk without HA, fever, pain, d/c, tinnitus or vertigo.  Denies hyper or hypo thyroid symptoms such as voice, skin or hair change.   Wt Readings from Last 3 Encounters:  04/19/21 130 lb (59 kg)  12/20/20 124 lb 12.8 oz (56.6 kg)  11/22/20 126 lb 6.4 oz (57.3 kg)   BP Readings from Last 3 Encounters:  04/19/21 122/60  02/25/21 110/62  12/20/20 100/60   Immunization History  Administered Date(s) Administered   Fluad Quad(high Dose 65+) 08/30/2019, 08/14/2020   Influenza Split 07/10/2011, 08/18/2012   Influenza Whole 07/21/2008, 08/23/2009, 10/10/2010   Influenza, High Dose Seasonal PF 07/10/2016, 09/03/2017   Influenza,inj,Quad PF,6+ Mos 08/12/2013, 07/10/2014, 07/08/2018   PFIZER(Purple Top)SARS-COV-2 Vaccination 12/24/2019, 01/14/2020, 11/22/2020   Pneumococcal Conjugate-13 11/30/2014   Pneumococcal Polysaccharide-23 07/21/2007, 01/12/2013, 06/16/2018   Td 08/23/2009   Zoster, Live 07/20/2006   There are no preventive care reminders to display for this patient.      Past Medical History:  Diagnosis Date   ALLERGIC RHINITIS 05/01/2007   Anxiety    Arthritis    "hands" (12/28/2015)   ASTHMA 05/01/2007   "since I was a child"   Cancer ([place])    LUNG   CHF (congestive heart failure) ([place])    Chronic bronchitis (HCC)    COPD (chronic obstructive pulmonary disease) ([place])    DEPRESSION 07/21/2008   DVT (deep venous thrombosis) ([place]) 1960s   LLE   FATIGUE 10/10/2010   HYPERTENSION 05/01/2007   HYPOTHYROIDISM 08/23/2009   s/p RAI   LUMBAR RADICULOPATHY, RIGHT 08/25/2008   On home oxygen therapy    "3-4L; qd; all the time" (12/28/2015)   OSTEOPOROSIS 05/01/2007   SHOULDER PAIN, LEFT 08/23/2009   SINUSITIS, CHRONIC 10/10/2010   Past Surgical History:  Procedure Laterality Date   APPENDECTOMY     DILATION AND CURETTAGE OF UTERUS  multiple   history of multiple dialations and curettages and miscarriages, unfortunately never carrying a child to term   ECTOPIC PREGNANCY SURGERY  "late '60s or early '70s   ELECTROCARDIOGRAM  06/20/2006   OOPHORECTOMY Right     reports that she quit smoking about 4 years ago. Her smoking use included cigarettes. She started smoking about 57 years ago. She has a 52.00 pack-year smoking history. She has never used smokeless tobacco. She reports previous alcohol use. She reports previous drug use. family history includes Alcohol  abuse in her brother; Asthma in an other family member; Diabetes in her brother; Hypertension in an other family member; Lung cancer in her father; Stroke in her mother; Thyroid disease in her mother. Allergies  Allergen Reactions   Alendronate Sodium Other (See Comments)    Patient does not remember this reaction   Current Outpatient Medications on File Prior to Visit  Medication Sig Dispense Refill   albuterol (PROVENTIL) (2.5 MG/3ML) 0.083% nebulizer solution Take 3 mLs (2.5 mg total) by nebulization every 6 (six) hours as needed for wheezing or shortness of breath. 75 mL 0   albuterol (VENTOLIN  HFA) 108 (90 Base) MCG/ACT inhaler INHALE 1 TO 2 PUFFS INTO THE LUNGS EVERY 6 HOURS AS NEEDED FOR WHEEZING OR SHORTNESS OF BREATH 18 g 4   aspirin EC 81 MG EC tablet Take 1 tablet (81 mg total) by mouth daily. 30 tablet 0   ipratropium-albuterol (DUONEB) 0.5-2.5 (3) MG/3ML SOLN Take 3 mLs by nebulization every 6 (six) hours as needed. 360 mL 11   OXYGEN Inhale 3 L into the lungs. 24/7     predniSONE (DELTASONE) 5 MG tablet TAKE 1 TABLET BY MOUTH DAILY WITH BREAKFAST. 60 tablet 1   No current facility-administered medications on file prior to visit.        ROS:  All others reviewed and negative.  Objective        PE:  BP 122/60 (BP Location: Right Arm, Patient Position: Sitting, Cuff Size: Normal)   Pulse 79   Temp 98.2 F (36.8 C) (Oral)   Ht 5' (1.524 m)   Wt 130 lb (59 kg)   SpO2 92%   BMI 25.39 kg/m                 Constitutional: Pt appears in NAD               HENT: Head: NCAT.                Right Ear: External ear normal.                 Left Ear: External ear normal.                Eyes: . Pupils are equal, round, and reactive to light. Conjunctivae and EOM are normal               Nose: without d/c or deformity               Neck: Neck supple. Gross normal ROM               Cardiovascular: Normal rate and regular rhythm.                 Pulmonary/Chest: Effort normal and breath sounds decreased without rales or wheezing.                Abd:  Soft, NT, ND, + BS, no organomegaly               Neurological: Pt is alert. At baseline orientation, motor grossly intact               Skin: Skin is warm. No rashes, no other new lesions, LE edema - 2+ edema to knees               Psychiatric: Pt behavior is normal without agitation   Micro: none  Cardiac tracings I have personally interpreted today:  none  Pertinent Radiological  findings (summarize): none   Lab Results  Component Value Date   WBC 5.9 04/19/2021   HGB 13.3 04/19/2021   HCT 41.3 04/19/2021   PLT 167.0  04/19/2021   GLUCOSE 104 (H) 04/19/2021   CHOL 195 04/19/2021   TRIG 43.0 04/19/2021   HDL 100.30 04/19/2021   LDLDIRECT 102.1 10/10/2010   LDLCALC 86 04/19/2021   ALT 20 04/19/2021   AST 30 04/19/2021   NA 143 04/19/2021   K 4.6 04/19/2021   CL 94 (L) 04/19/2021   CREATININE 0.78 04/19/2021   BUN 16 04/19/2021   CO2 42 (H) 04/19/2021   TSH 5.30 04/19/2021   INR 1.58 (H) 03/25/2016   HGBA1C 6.3 04/19/2021   MICROALBUR 4.0 (H) 04/19/2021   Assessment/Plan:  TIARRA ANASTACIO is a 75 y.o. Black or African American [2] female with  has a past medical history of ALLERGIC RHINITIS (05/01/2007), Anxiety, Arthritis, ASTHMA (05/01/2007), Cancer (HCC), CHF (congestive heart failure) ([place]), Chronic bronchitis ([place]), COPD (chronic obstructive pulmonary disease) ([place]), DEPRESSION (07/21/2008), DVT (deep venous thrombosis) ([place]) (1960s), FATIGUE (10/10/2010), HYPERTENSION (05/01/2007), HYPOTHYROIDISM (08/23/2009), LUMBAR RADICULOPATHY, RIGHT (08/25/2008), On home oxygen therapy, OSTEOPOROSIS (05/01/2007), SHOULDER PAIN, LEFT (08/23/2009), and SINUSITIS, CHRONIC (10/10/2010).  Encounter for well adult exam with abnormal findings Age and sex appropriate education and counseling updated with regular exercise and diet Referrals for preventative services - declines dxa Immunizations addressed - declines covid booster, shingrix, tdap Smoking counseling  - none needed Evidence for depression or other mood disorder - none significant Most recent labs reviewed. I have personally reviewed and have noted: 1) the patient's medical and social history 2) The patient's current medications and supplements 3) The patient's height, weight, and BMI have been recorded in the chart   COPD (chronic obstructive pulmonary disease) ([place]) Stable, to restart trelegy scheduled,  to f/u any worsening symptoms or concerns  Chronic respiratory failure with hypoxia ([place]) Stable, cont home o2  Essential hypertension BP Readings  from Last 3 Encounters:  04/19/21 122/60  02/25/21 110/62  12/20/20 100/60   Stable, pt to continue medical treatment lasix   Hypothyroidism Lab Results  Component Value Date   TSH 5.30 04/19/2021   Stable, pt to continue levothyroxine 75   Edema due to congestive heart failure (HCC) Uncontrolled, Encouraged compliance with lasix bid,  to f/u any worsening symptoms or concerns   Bilateral hearing loss Patient agreed to ear lavage due to bilateral cerumen impaction; Ear lavage completed with no difficulty, impaction resolved, TM's look normal; she will continue using OTC kits regularly for prevention  Followup: Return in about 3 months (around 07/20/2021).  [name], MD 04/20/2021 3:43 PM [facility]

## 2021-04-20 DIAGNOSIS — Z0001 Encounter for general adult medical examination with abnormal findings: Secondary | ICD-10-CM | POA: Insufficient documentation

## 2021-04-20 NOTE — Assessment & Plan Note (Signed)
Lab Results  Component Value Date   TSH 5.30 04/19/2021   Stable, pt to continue levothyroxine 75

## 2021-04-20 NOTE — Assessment & Plan Note (Signed)
Age and sex appropriate education and counseling updated with regular exercise and diet Referrals for preventative services - declines dxa Immunizations addressed - declines covid booster, shingrix, tdap Smoking counseling  - none needed Evidence for depression or other mood disorder - none significant Most recent labs reviewed. I have personally reviewed and have noted: 1) the patient's medical and social history 2) The patient's current medications and supplements 3) The patient's height, weight, and BMI have been recorded in the chart

## 2021-04-20 NOTE — Assessment & Plan Note (Signed)
BP Readings from Last 3 Encounters:  04/19/21 122/60  02/25/21 110/62  12/20/20 100/60   Stable, pt to continue medical treatment lasix

## 2021-04-20 NOTE — Assessment & Plan Note (Signed)
Patient agreed to ear lavage due to bilateral cerumen impaction; Ear lavage completed with no difficulty, impaction resolved, TM's look normal; she will continue using OTC kits regularly for prevention

## 2021-04-20 NOTE — Assessment & Plan Note (Addendum)
Uncontrolled, Encouraged compliance with lasix bid,  to f/u any worsening symptoms or concerns

## 2021-04-20 NOTE — Assessment & Plan Note (Signed)
Stable, cont home o2 

## 2021-04-20 NOTE — Assessment & Plan Note (Signed)
Stable, to restart trelegy scheduled,  to f/u any worsening symptoms or concerns

## 2021-05-02 DIAGNOSIS — B9561 Methicillin susceptible Staphylococcus aureus infection as the cause of diseases classified elsewhere: Secondary | ICD-10-CM | POA: Diagnosis not present

## 2021-05-02 DIAGNOSIS — J449 Chronic obstructive pulmonary disease, unspecified: Secondary | ICD-10-CM | POA: Diagnosis not present

## 2021-05-02 DIAGNOSIS — J9612 Chronic respiratory failure with hypercapnia: Secondary | ICD-10-CM | POA: Diagnosis not present

## 2021-05-02 DIAGNOSIS — R7881 Bacteremia: Secondary | ICD-10-CM | POA: Diagnosis not present

## 2021-05-16 ENCOUNTER — Telehealth: Payer: Self-pay

## 2021-05-16 NOTE — Telephone Encounter (Signed)
215 pm.  Phone call made to PCP that patient will be discharged from Palliative Care.  Unable to make contact with patient since 08/2020 despite multiple attempts.  Message left with Verdis Frederickson and this will be given to [name].

## 2021-06-02 DIAGNOSIS — J449 Chronic obstructive pulmonary disease, unspecified: Secondary | ICD-10-CM | POA: Diagnosis not present

## 2021-06-02 DIAGNOSIS — B9561 Methicillin susceptible Staphylococcus aureus infection as the cause of diseases classified elsewhere: Secondary | ICD-10-CM | POA: Diagnosis not present

## 2021-06-02 DIAGNOSIS — R7881 Bacteremia: Secondary | ICD-10-CM | POA: Diagnosis not present

## 2021-06-02 DIAGNOSIS — J9612 Chronic respiratory failure with hypercapnia: Secondary | ICD-10-CM | POA: Diagnosis not present

## 2021-06-05 ENCOUNTER — Telehealth: Payer: Self-pay | Admitting: Lab

## 2021-06-05 NOTE — Progress Notes (Signed)
  Chronic Care Management   Outreach Note  06/05/2021 Name: Jasmine [name] MRN: [id number] DOB: [birth year]  Referred by: Biagio Borg, MD Reason for referral : Medication Management   An unsuccessful telephone outreach was attempted today. The patient was referred to the pharmacist for assistance with care management and care coordination.   Follow Up Plan:   [place]

## 2021-06-27 ENCOUNTER — Other Ambulatory Visit: Payer: Self-pay

## 2021-06-27 NOTE — Patient Outreach (Signed)
[facility]) Care Management  [place]  06/27/2021   Jasmine [name] 10/15/1946 [id number]  Subjective: Telephone call to patient for disease management.  She reports doing good.  Discussed COPD management.  No concerns.    Objective:   Encounter Medications:  Outpatient Encounter Medications as of 06/27/2021  Medication Sig   albuterol (PROVENTIL) (2.5 MG/3ML) 0.083% nebulizer solution Take 3 mLs (2.5 mg total) by nebulization every 6 (six) hours as needed for wheezing or shortness of breath.   albuterol (VENTOLIN HFA) 108 (90 Base) MCG/ACT inhaler INHALE 1 TO 2 PUFFS INTO THE LUNGS EVERY 6 HOURS AS NEEDED FOR WHEEZING OR SHORTNESS OF BREATH   aspirin EC 81 MG EC tablet Take 1 tablet (81 mg total) by mouth daily.   atorvastatin (LIPITOR) 10 MG tablet TAKE 1 TABLET (10 MG TOTAL) BY MOUTH DAILY.   citalopram (CELEXA) 20 MG tablet TAKE ONE TABLET BY MOUTH DAILY   Fluticasone-Umeclidin-Vilant (TRELEGY ELLIPTA) 200-62.5-25 MCG/INH AEPB Inhale 1 puff into the lungs daily.   furosemide (LASIX) 40 MG tablet 1 tab by mouth twice per day   ipratropium-albuterol (DUONEB) 0.5-2.5 (3) MG/3ML SOLN Take 3 mLs by nebulization every 6 (six) hours as needed.   levothyroxine (SYNTHROID) 75 MCG tablet TAKE ONE TABLET BY MOUTH EARLY MORNING   OXYGEN Inhale 3 L into the lungs. 24/7   potassium chloride (KLOR-CON) 10 MEQ tablet TAKE 1 TABLET BY MOUTH 2 TIMES DAILY.   predniSONE (DELTASONE) 5 MG tablet TAKE 1 TABLET BY MOUTH DAILY WITH BREAKFAST.   No facility-administered encounter medications on file as of 06/27/2021.    Functional Status:  In your present state of health, do you have any difficulty performing the following activities: 06/27/2021 04/19/2021  Hearing? N Y  Vision? N Y  Difficulty concentrating or making decisions? Y N  Comment forgetful at times -  Walking or climbing stairs? N N  Comment shortness of breath -  Dressing or bathing? N N  Doing errands, shopping? N  N  Preparing Food and eating ? N -  Using the Toilet? N -  In the past six months, have you accidently leaked urine? Y -  Comment ocassional leak -  Do you have problems with loss of bowel control? N -  Managing your Medications? N -  Managing your Finances? N -  Housekeeping or managing your Housekeeping? N -  Some recent data might be hidden    Fall/Depression Screening: Fall Risk  06/27/2021 04/19/2021 04/19/2021  Falls in the past year? 0 1 1  Number falls in past yr: - 0 0  Comment - - -  Injury with Fall? - 0 0  Risk Factor Category  - - -  Risk for fall due to : - - -  Follow up - - -   PHQ 2/9 Scores 06/27/2021 04/19/2021 04/19/2021 06/08/2020 04/16/2020 02/07/2020 02/06/2020  PHQ - 2 Score 0 1 0 0 0 0 0  PHQ- 9 Score - - - - - - -    Assessment:   Care Plan Care Plan : COPD (Adult)  Updates made by Jon Billings, RN since 06/27/2021 12:00 AM     Problem: Symptom Exacerbation (COPD)      Long-Range Goal: Symptom Exacerbation Prevented or Minimized as evidenced by no exacerbation of COPD   Start Date: 06/27/2021  Expected End Date: 04/18/2022  Priority: High  Note:   Evidence-based guidance:  Monitor for signs of respiratory infection, including changes in sputum color,  volume and thickness, as well as fever.  Encourage infection prevention strategies that may include prophylactic antibiotic therapy for patients with history of frequent exacerbations or antibiotic administration during exacerbation based on presentation, risk and benefit.  Encourage receipt of influenza and pneumococcal vaccine.  Prepare patient for use of home long-term oxygen therapy in presence of sever resting hypoxemia.  Prepare patients for laboratory studies or diagnostic exams, such as spirometry, pulse oximetry and arterial blood gas based on current symptoms, risk factors and presentation.  Assess barriers and manage adherence, including inhaler technique and persistent trigger exposure; encourage  adherence, even when symptoms are controlled or infrequent.  Assess and monitor for signs/symptoms of psychosocial concerns, such as shortness of breath-anxiety cycle or depression that may impact stability of symptoms.  Identify economic resources, sociocultural beliefs, social factors and health literacy that may interfere with adherence.  Promote lifestyle changes when needed, including regular physical activity based on tolerance, weight loss, healthy eating and stress management.  Consider referral to nurse or community health worker or home-visiting program for intensive support and education (disease-management program).  Increase frequency of follow-up following exacerbation or hospitalization; consider transition of care interventions, such as hospital visit, home visit, telephone follow-up, review of discharge summary and resource referrals.   Notes:     Task: Identify and Minimize Risk of COPD Exacerbation   Due Date: 04/18/2022  Priority: Routine  Responsible User: Jon Billings, RN  Note:   Care Management Activities:    - healthy lifestyle promoted - rescue (action) plan reviewed - signs/symptoms of infection reviewed    Notes: 06/25/21 Patient managing COPD.  COPD Management Discussed: Oxygen use- keeping oxygen levels above 80. Encouraged patient to pace self with activity. Reviewed importance of medication compliance. Reviewed when to call for physician for increased shortness of breath, increased sputum, fatigue that is not his normal, cough, fever or sick contacts.        Goals Addressed             This Visit's Progress    Track and Manage My Symptoms-COPD       Barriers: Health Behaviors Timeframe:  Long-Range Goal Priority:  High Start Date:     06/27/21                       Expected End Date:     04/18/22                  Follow Up Date 10/19/21   - follow rescue plan if symptoms flare-up - keep follow-up appointments    Why is this important?    Tracking your symptoms and other information about your health helps your doctor plan your care.  Write down the symptoms, the time of day, what you were doing and what medicine you are taking.  You will soon learn how to manage your symptoms.     Notes: 06/27/21 Patient reports managing well.  Discussed COPD and BIPAP. COPD Management Discussed: Oxygen use- keeping oxygen levels above 80. Encouraged patient to pace self with activity. Reviewed importance of medication compliance. Reviewed when to call for physician for increased shortness of breath, increased sputum, fatigue that is not his normal, cough, fever or sick contacts.         Plan:  Follow-up: Patient agrees to Care Plan and Follow-up. Follow-up in 3 month(s)  [name], RN, MSN [place] Management Care Management Coordinator Direct Line [phone] Cell [phone] Toll Free: [phone]  Fax: [phone]

## 2021-06-27 NOTE — Patient Instructions (Addendum)
Goals Addressed             This Visit's Progress    Track and Manage My Symptoms-COPD       Barriers: Health Behaviors Timeframe:  Long-Range Goal Priority:  High Start Date:     06/27/21                       Expected End Date:     04/18/22                  Follow Up Date 10/19/21   - follow rescue plan if symptoms flare-up - keep follow-up appointments    Why is this important?   Tracking your symptoms and other information about your health helps your doctor plan your care.  Write down the symptoms, the time of day, what you were doing and what medicine you are taking.  You will soon learn how to manage your symptoms.     Notes: 06/27/21 Patient reports managing well.  Discussed COPD and BIPAP. COPD Management Discussed: Oxygen use- keeping oxygen levels above 80. Encouraged patient to pace self with activity. Reviewed importance of medication compliance. Reviewed when to call for physician for increased shortness of breath, increased sputum, fatigue that is not his normal, cough, fever or sick contacts.

## 2021-07-03 DIAGNOSIS — R7881 Bacteremia: Secondary | ICD-10-CM | POA: Diagnosis not present

## 2021-07-03 DIAGNOSIS — J449 Chronic obstructive pulmonary disease, unspecified: Secondary | ICD-10-CM | POA: Diagnosis not present

## 2021-07-03 DIAGNOSIS — B9561 Methicillin susceptible Staphylococcus aureus infection as the cause of diseases classified elsewhere: Secondary | ICD-10-CM | POA: Diagnosis not present

## 2021-07-03 DIAGNOSIS — J9612 Chronic respiratory failure with hypercapnia: Secondary | ICD-10-CM | POA: Diagnosis not present

## 2021-07-12 ENCOUNTER — Other Ambulatory Visit: Payer: Self-pay | Admitting: Internal Medicine

## 2021-07-12 NOTE — Telephone Encounter (Signed)
Please refill as per office routine med refill policy (all routine meds to be refilled for 3 mo or monthly (per pt preference) up to one year from last visit, then month to month grace period for 3 mo, then further med refills will have to be denied) ? ?

## 2021-07-22 ENCOUNTER — Ambulatory Visit (INDEPENDENT_AMBULATORY_CARE_PROVIDER_SITE_OTHER): Payer: Medicare HMO | Admitting: Internal Medicine

## 2021-07-22 ENCOUNTER — Other Ambulatory Visit: Payer: Self-pay

## 2021-07-22 ENCOUNTER — Encounter: Payer: Self-pay | Admitting: Internal Medicine

## 2021-07-22 VITALS — BP 110/62 | HR 86 | Ht 60.0 in | Wt 130.0 lb

## 2021-07-22 DIAGNOSIS — E089 Diabetes mellitus due to underlying condition without complications: Secondary | ICD-10-CM | POA: Diagnosis not present

## 2021-07-22 DIAGNOSIS — E890 Postprocedural hypothyroidism: Secondary | ICD-10-CM | POA: Diagnosis not present

## 2021-07-22 DIAGNOSIS — R5382 Chronic fatigue, unspecified: Secondary | ICD-10-CM

## 2021-07-22 DIAGNOSIS — J449 Chronic obstructive pulmonary disease, unspecified: Secondary | ICD-10-CM | POA: Diagnosis not present

## 2021-07-22 LAB — TSH: TSH: 0.74 u[IU]/mL (ref 0.35–5.50)

## 2021-07-22 NOTE — Progress Notes (Signed)
Name: Jasmine [name]  MRN/ DOB: [id number], Sep 28, 1946   Age/ Sex: 75 y.o., female    PCP: Biagio Borg, MD   Reason for Endocrinology Evaluation: Type 2 Diabetes Mellitus     Date of Initial Endocrinology Visit: 07/22/2021     PATIENT IDENTIFIER: Jasmine [name] is a 75 y.o. female with a past medical history of T2DM , COPD, Osteoporosis, and postablative Hypothyroidism . The patient presented for initial endocrinology clinic visit on 07/22/2021 for consultative assistance with her diabetes management.    HPI: [name] was    Diagnosed with DM 2021 Prior Medications tried/Intolerance: N/A Currently checking blood sugars 0 x / day  Hypoglycemia episodes : no             Hemoglobin A1c has ranged from 6.3% in 2021, peaking at 6.6% in  2022   In terms of diet, the patient eats 1 -2 meals a day . She avoids sugar sweetened beverages    Has LE edema for the past few years   Has been tired lately   THYROID HISTORY : She has a hx of Graves' Disease many years ago, she is S/P RAI ablation in 2006   She is on Levothyroxine 75 mcg daily    HOME DIABETES REGIMEN: N/A    Statin: yes ACE-I/ARB: no    PAST HISTORY: Past Medical History:  Past Medical History:  Diagnosis Date   ALLERGIC RHINITIS 05/01/2007   Anxiety    Arthritis    "hands" (12/28/2015)   ASTHMA 05/01/2007   "since I was a child"   Cancer ([place])    LUNG   CHF (congestive heart failure) ([place])    Chronic bronchitis ([place])    COPD (chronic obstructive pulmonary disease) ([place])    DEPRESSION 07/21/2008   DVT (deep venous thrombosis) ([place]) 1960s   LLE   FATIGUE 10/10/2010   HYPERTENSION 05/01/2007   HYPOTHYROIDISM 08/23/2009   s/p RAI   LUMBAR RADICULOPATHY, RIGHT 08/25/2008   On home oxygen therapy    "3-4L; qd; all the time" (12/28/2015)   OSTEOPOROSIS 05/01/2007   SHOULDER PAIN, LEFT 08/23/2009   SINUSITIS, CHRONIC 10/10/2010   Past Surgical History:  Past Surgical History:  Procedure Laterality  Date   APPENDECTOMY     DILATION AND CURETTAGE OF UTERUS  multiple   history of multiple dialations and curettages and miscarriages, unfortunately never carrying a child to term   ECTOPIC PREGNANCY SURGERY  "late '60s or early '70s   ELECTROCARDIOGRAM  06/20/2006   OOPHORECTOMY Right     Social History:  reports that she quit smoking about 5 years ago. Her smoking use included cigarettes. She started smoking about 57 years ago. She has a 52.00 pack-year smoking history. She has never used smokeless tobacco. She reports that she does not currently use alcohol. She reports that she does not currently use drugs. Family History:  Family History  Problem Relation Age of Onset   Lung cancer Father    Alcohol abuse Brother    Diabetes Brother    Hypertension Other    Stroke Mother    Thyroid disease Mother    Asthma Other        maternal aunts   Breast cancer Neg Hx      HOME MEDICATIONS: Allergies as of 07/22/2021       Reactions   Alendronate Sodium Other (See Comments)   Patient does not remember this reaction        Medication List  Accurate as of July 22, 2021  2:47 PM. If you have any questions, ask your nurse or doctor.          albuterol (2.5 MG/3ML) 0.083% nebulizer solution Commonly known as: PROVENTIL Take 3 mLs (2.5 mg total) by nebulization every 6 (six) hours as needed for wheezing or shortness of breath.   albuterol 108 (90 Base) MCG/ACT inhaler Commonly known as: VENTOLIN HFA INHALE 1 TO 2 PUFFS INTO THE LUNGS EVERY 6 HOURS AS NEEDED FOR WHEEZING OR SHORTNESS OF BREATH   aspirin 81 MG EC tablet Take 1 tablet (81 mg total) by mouth daily.   atorvastatin 10 MG tablet Commonly known as: LIPITOR TAKE 1 TABLET BY MOUTH DAILY.   citalopram 20 MG tablet Commonly known as: CELEXA TAKE ONE TABLET BY MOUTH DAILY   furosemide 40 MG tablet Commonly known as: LASIX 1 tab by mouth twice per day   ipratropium-albuterol 0.5-2.5 (3) MG/3ML  Soln Commonly known as: DUONEB Take 3 mLs by nebulization every 6 (six) hours as needed.   levothyroxine 75 MCG tablet Commonly known as: SYNTHROID TAKE ONE TABLET BY MOUTH EARLY MORNING   OXYGEN Inhale 3 L into the lungs. 24/7   potassium chloride 10 MEQ tablet Commonly known as: KLOR-CON TAKE 1 TABLET BY MOUTH 2 TIMES DAILY.   predniSONE 5 MG tablet Commonly known as: DELTASONE TAKE 1 TABLET BY MOUTH DAILY WITH BREAKFAST.   Trelegy Ellipta 200-62.5-25 MCG/INH Aepb Generic drug: Fluticasone-Umeclidin-Vilant Inhale 1 puff into the lungs daily.         ALLERGIES: Allergies  Allergen Reactions   Alendronate Sodium Other (See Comments)    Patient does not remember this reaction     REVIEW OF SYSTEMS: A comprehensive ROS was conducted with the patient and is negative except as per HPI and below:  ROS    OBJECTIVE:   VITAL SIGNS: BP 110/62 (BP Location: Left Arm, Patient Position: Sitting, Cuff Size: Small)   Pulse 86   Ht 5' (1.524 m)   Wt 130 lb (59 kg)   SpO2 94%   BMI 25.39 kg/m    PHYSICAL EXAM:  General: Pt appears well and is in NAD  Neck: General: Supple without adenopathy or carotid bruits. Thyroid: Thyroid size normal.  No goiter or nodules appreciated.  Lungs: Clear with good BS bilat with no rales, rhonchi, or wheezes  Heart: RRR   Extremities:  Lower extremities -Trace  pretibial edema. No lesions.  Neuro: MS is good with appropriate affect, pt is alert and Ox3      DATA REVIEWED:  Lab Results  Component Value Date   HGBA1C 6.3 04/19/2021   HGBA1C 6.6 (H) 06/08/2020   HGBA1C 5.9 (H) 01/29/2020   Lab Results  Component Value Date   MICROALBUR 4.0 (H) 04/19/2021   LDLCALC 86 04/19/2021   CREATININE 0.78 04/19/2021   Lab Results  Component Value Date   MICRALBCREAT 2.4 04/19/2021    Lab Results  Component Value Date   CHOL 195 04/19/2021   HDL 100.30 04/19/2021   LDLCALC 86 04/19/2021   LDLDIRECT 102.1 10/10/2010   TRIG  43.0 04/19/2021   CHOLHDL 2 04/19/2021        ASSESSMENT / PLAN / RECOMMENDATIONS:   1) Type 2 Diabetes Mellitus: - Most recent A1c of 6.3 %. Goal A1c < 7.0 %.    - She had an A1c 6.6 % in 2021 . This is most likely attributes to chronic steroid therapy - Recent A1c optimal  without glycemic agents - Emphasized the importance of low carb diet to remain off glycemic agents     MEDICATIONS: N/A    2) Postablative Hypothyroidism:   - Pt states this is the main reason for her visit today  - She has chronic fatigue, THS is normal - Pt educated extensively on the correct way to take levothyroxine (first thing in the morning with water, 30 minutes before eating or taking other medications). - Pt encouraged to double dose the following day if she were to miss a dose given long half-life of levothyroxine.   Medication  Levothyroxine 75 mcg daily   3. Fatigue :  - Multifactorial : respiratory failure, CHF, medications , lack of proper sleep etc    F/U in 6 months    Signed electronically by: Mack Guise, MD  [facility] Endocrinology  [facility] [address] [place], [place] [address] Phone: [phone] FAX: [phone]   CC: Biagio Borg, [place] [address] Phone: [phone]  Fax: [phone]    Return to Endocrinology clinic as below: Future Appointments  Date Time Provider [place]  09/26/2021  8:30 AM Jon Billings, RN [facility] None

## 2021-07-22 NOTE — Patient Instructions (Signed)

## 2021-07-23 ENCOUNTER — Telehealth: Payer: Self-pay | Admitting: Internal Medicine

## 2021-07-23 MED ORDER — LEVOTHYROXINE SODIUM 75 MCG PO TABS: ORAL_TABLET | ORAL | 3 refills | Status: DC

## 2021-07-23 NOTE — Telephone Encounter (Signed)
Please let the pt know that her thyroid is normal and to continue current dose of levothyroxine 75 mcg daily .    No changes    Thanks

## 2021-07-23 NOTE — Telephone Encounter (Signed)
Patient has been notified and will continue medication

## 2021-07-30 ENCOUNTER — Emergency Department (HOSPITAL_COMMUNITY): Payer: Medicare HMO

## 2021-07-30 ENCOUNTER — Other Ambulatory Visit: Payer: Self-pay

## 2021-07-30 ENCOUNTER — Observation Stay (HOSPITAL_COMMUNITY)
Admission: EM | Admit: 2021-07-30 | Discharge: 2021-08-01 | Disposition: A | Discharge status: Home / Self Care | Payer: Medicare HMO | Attending: Internal Medicine | Admitting: Internal Medicine

## 2021-07-30 DIAGNOSIS — E039 Hypothyroidism, unspecified: Secondary | ICD-10-CM | POA: Diagnosis not present

## 2021-07-30 DIAGNOSIS — J441 Chronic obstructive pulmonary disease with (acute) exacerbation: Secondary | ICD-10-CM | POA: Diagnosis not present

## 2021-07-30 DIAGNOSIS — Z7982 Long term (current) use of aspirin: Secondary | ICD-10-CM | POA: Insufficient documentation

## 2021-07-30 DIAGNOSIS — J4520 Mild intermittent asthma, uncomplicated: Secondary | ICD-10-CM | POA: Diagnosis not present

## 2021-07-30 DIAGNOSIS — E890 Postprocedural hypothyroidism: Secondary | ICD-10-CM | POA: Diagnosis not present

## 2021-07-30 DIAGNOSIS — J9621 Acute and chronic respiratory failure with hypoxia: Secondary | ICD-10-CM | POA: Diagnosis present

## 2021-07-30 DIAGNOSIS — I10 Essential (primary) hypertension: Secondary | ICD-10-CM | POA: Diagnosis present

## 2021-07-30 DIAGNOSIS — E089 Diabetes mellitus due to underlying condition without complications: Secondary | ICD-10-CM | POA: Diagnosis not present

## 2021-07-30 DIAGNOSIS — Z85118 Personal history of other malignant neoplasm of bronchus and lung: Secondary | ICD-10-CM | POA: Insufficient documentation

## 2021-07-30 DIAGNOSIS — J9611 Chronic respiratory failure with hypoxia: Secondary | ICD-10-CM | POA: Diagnosis not present

## 2021-07-30 DIAGNOSIS — Z20822 Contact with and (suspected) exposure to covid-19: Secondary | ICD-10-CM | POA: Diagnosis not present

## 2021-07-30 DIAGNOSIS — F418 Other specified anxiety disorders: Secondary | ICD-10-CM | POA: Diagnosis present

## 2021-07-30 DIAGNOSIS — J45909 Unspecified asthma, uncomplicated: Secondary | ICD-10-CM | POA: Diagnosis not present

## 2021-07-30 DIAGNOSIS — I110 Hypertensive heart disease with heart failure: Secondary | ICD-10-CM | POA: Diagnosis not present

## 2021-07-30 DIAGNOSIS — R0602 Shortness of breath: Secondary | ICD-10-CM | POA: Diagnosis not present

## 2021-07-30 DIAGNOSIS — Z23 Encounter for immunization: Secondary | ICD-10-CM | POA: Diagnosis not present

## 2021-07-30 DIAGNOSIS — J90 Pleural effusion, not elsewhere classified: Secondary | ICD-10-CM | POA: Diagnosis not present

## 2021-07-30 DIAGNOSIS — I959 Hypotension, unspecified: Secondary | ICD-10-CM | POA: Diagnosis not present

## 2021-07-30 DIAGNOSIS — H900 Conductive hearing loss, bilateral: Secondary | ICD-10-CM | POA: Diagnosis not present

## 2021-07-30 DIAGNOSIS — R0902 Hypoxemia: Secondary | ICD-10-CM | POA: Diagnosis not present

## 2021-07-30 DIAGNOSIS — H9193 Unspecified hearing loss, bilateral: Secondary | ICD-10-CM | POA: Diagnosis present

## 2021-07-30 DIAGNOSIS — Z87891 Personal history of nicotine dependence: Secondary | ICD-10-CM | POA: Insufficient documentation

## 2021-07-30 DIAGNOSIS — J449 Chronic obstructive pulmonary disease, unspecified: Secondary | ICD-10-CM | POA: Diagnosis not present

## 2021-07-30 DIAGNOSIS — I5032 Chronic diastolic (congestive) heart failure: Secondary | ICD-10-CM | POA: Diagnosis present

## 2021-07-30 DIAGNOSIS — E119 Type 2 diabetes mellitus without complications: Secondary | ICD-10-CM | POA: Diagnosis not present

## 2021-07-30 DIAGNOSIS — R911 Solitary pulmonary nodule: Secondary | ICD-10-CM | POA: Diagnosis not present

## 2021-07-30 DIAGNOSIS — Z9981 Dependence on supplemental oxygen: Secondary | ICD-10-CM

## 2021-07-30 LAB — TROPONIN I (HIGH SENSITIVITY)
Troponin I (High Sensitivity): 10 ng/L (ref <18)
Troponin I (High Sensitivity): 8 ng/L (ref <18)

## 2021-07-30 LAB — CBC WITH DIFFERENTIAL/PLATELET
Abs Immature Granulocytes: 0.01 10*3/uL (ref 0.00–0.07)
Basophils Absolute: 0 10*3/uL (ref 0.0–0.1)
Basophils Relative: 1 %
Eosinophils Absolute: 0.1 10*3/uL (ref 0.0–0.5)
Eosinophils Relative: 1 %
HCT: 45.9 % (ref 36.0–46.0)
Hemoglobin: 13.7 g/dL (ref 12.0–15.0)
Immature Granulocytes: 0 %
Lymphocytes Relative: 17 %
Lymphs Abs: 1.3 10*3/uL (ref 0.7–4.0)
MCH: 31.2 pg (ref 26.0–34.0)
MCHC: 29.8 g/dL — ABNORMAL LOW (ref 30.0–36.0)
MCV: 104.6 fL — ABNORMAL HIGH (ref 80.0–100.0)
Monocytes Absolute: 0.7 10*3/uL (ref 0.1–1.0)
Monocytes Relative: 10 %
Neutro Abs: 5.2 10*3/uL (ref 1.7–7.7)
Neutrophils Relative %: 71 %
Platelets: 135 10*3/uL — ABNORMAL LOW (ref 150–400)
RBC: 4.39 MIL/uL (ref 3.87–5.11)
RDW: 14.4 % (ref 11.5–15.5)
WBC: 7.3 10*3/uL (ref 4.0–10.5)
nRBC: 0 % (ref 0.0–0.2)

## 2021-07-30 LAB — RESP PANEL BY RT-PCR (FLU A&B, COVID) ARPGX2
Influenza A by PCR: NEGATIVE
Influenza B by PCR: NEGATIVE
SARS Coronavirus 2 by RT PCR: NEGATIVE

## 2021-07-30 LAB — COMPREHENSIVE METABOLIC PANEL
ALT: 22 U/L (ref 0–44)
AST: 34 U/L (ref 15–41)
Albumin: 3.8 g/dL (ref 3.5–5.0)
Alkaline Phosphatase: 52 U/L (ref 38–126)
Anion gap: 10 (ref 5–15)
BUN: 12 mg/dL (ref 8–23)
CO2: 34 mmol/L — ABNORMAL HIGH (ref 22–32)
Calcium: 10 mg/dL (ref 8.9–10.3)
Chloride: 96 mmol/L — ABNORMAL LOW (ref 98–111)
Creatinine, Ser: 0.78 mg/dL (ref 0.44–1.00)
GFR, Estimated: >60 mL/min (ref >60)
Glucose, Bld: 115 mg/dL — ABNORMAL HIGH (ref 70–99)
Potassium: 5.4 mmol/L — ABNORMAL HIGH (ref 3.5–5.1)
Sodium: 140 mmol/L (ref 135–145)
Total Bilirubin: 1 mg/dL (ref 0.3–1.2)
Total Protein: 7.1 g/dL (ref 6.5–8.1)

## 2021-07-30 LAB — BRAIN NATRIURETIC PEPTIDE: B Natriuretic Peptide: 28 pg/mL (ref 0.0–100.0)

## 2021-07-30 MED ORDER — SODIUM CHLORIDE 0.9% FLUSH
3.0000 mL | Freq: Two times a day (BID) | INTRAVENOUS | Status: DC
  Administered 2021-07-31 – 2021-08-01 (×3): 3 mL via INTRAVENOUS

## 2021-07-30 MED ORDER — CITALOPRAM HYDROBROMIDE 20 MG PO TABS
20.0000 mg | ORAL_TABLET | Freq: Every day | ORAL | Status: DC
  Administered 2021-07-31 – 2021-08-01 (×2): 20 mg via ORAL
  Filled 2021-07-30: qty 1
  Filled 2021-07-30: qty 2

## 2021-07-30 MED ORDER — FUROSEMIDE 10 MG/ML IJ SOLN
40.0000 mg | Freq: Once | INTRAMUSCULAR | Status: AC
  Administered 2021-07-30: 40 mg via INTRAVENOUS
  Filled 2021-07-30: qty 4

## 2021-07-30 MED ORDER — FUROSEMIDE 20 MG PO TABS: 40.0000 mg | ORAL_TABLET | Freq: Two times a day (BID) | ORAL | Status: DC

## 2021-07-30 MED ORDER — ACETAMINOPHEN 325 MG PO TABS: 650.0000 mg | ORAL_TABLET | Freq: Four times a day (QID) | ORAL | Status: DC | PRN

## 2021-07-30 MED ORDER — POLYETHYLENE GLYCOL 3350 17 G PO PACK: 17.0000 g | PACK | Freq: Every day | ORAL | Status: DC | PRN

## 2021-07-30 MED ORDER — ALBUTEROL SULFATE HFA 108 (90 BASE) MCG/ACT IN AERS
4.0000 | INHALATION_SPRAY | Freq: Once | RESPIRATORY_TRACT | Status: AC
  Administered 2021-07-30: 4 via RESPIRATORY_TRACT
  Filled 2021-07-30: qty 6.7

## 2021-07-30 MED ORDER — INSULIN ASPART 100 UNIT/ML IJ SOLN
0.0000 [IU] | Freq: Three times a day (TID) | INTRAMUSCULAR | Status: DC
Start: 2021-07-31 — End: 2021-08-01
  Administered 2021-07-31: 2 [IU] via SUBCUTANEOUS
  Administered 2021-08-01: 1 [IU] via SUBCUTANEOUS

## 2021-07-30 MED ORDER — ALBUTEROL SULFATE (2.5 MG/3ML) 0.083% IN NEBU
5.0000 mg | INHALATION_SOLUTION | Freq: Once | RESPIRATORY_TRACT | Status: AC
  Administered 2021-07-30: 5 mg via RESPIRATORY_TRACT
  Filled 2021-07-30: qty 6

## 2021-07-30 MED ORDER — LEVOTHYROXINE SODIUM 75 MCG PO TABS
75.0000 ug | ORAL_TABLET | Freq: Every day | ORAL | Status: DC
  Administered 2021-08-01: 75 ug via ORAL
  Filled 2021-07-30: qty 1

## 2021-07-30 MED ORDER — UMECLIDINIUM BROMIDE 62.5 MCG/INH IN AEPB
1.0000 | INHALATION_SPRAY | Freq: Every day | RESPIRATORY_TRACT | Status: DC
  Administered 2021-07-31: 1 via RESPIRATORY_TRACT
  Filled 2021-07-30: qty 7

## 2021-07-30 MED ORDER — ALBUTEROL SULFATE (2.5 MG/3ML) 0.083% IN NEBU: 2.5000 mg | INHALATION_SOLUTION | RESPIRATORY_TRACT | Status: DC | PRN

## 2021-07-30 MED ORDER — ATORVASTATIN CALCIUM 10 MG PO TABS
10.0000 mg | ORAL_TABLET | Freq: Every day | ORAL | Status: DC
  Administered 2021-07-31 – 2021-08-01 (×2): 10 mg via ORAL
  Filled 2021-07-30 (×2): qty 1

## 2021-07-30 MED ORDER — FLUTICASONE FUROATE-VILANTEROL 200-25 MCG/INH IN AEPB
1.0000 | INHALATION_SPRAY | Freq: Every day | RESPIRATORY_TRACT | Status: DC
  Filled 2021-07-30: qty 28

## 2021-07-30 MED ORDER — IPRATROPIUM-ALBUTEROL 0.5-2.5 (3) MG/3ML IN SOLN
3.0000 mL | Freq: Four times a day (QID) | RESPIRATORY_TRACT | Status: DC
  Administered 2021-07-31: 3 mL via RESPIRATORY_TRACT
  Filled 2021-07-30: qty 3

## 2021-07-30 MED ORDER — ENOXAPARIN SODIUM 30 MG/0.3ML IJ SOSY
30.0000 mg | PREFILLED_SYRINGE | INTRAMUSCULAR | Status: DC
  Administered 2021-07-31 – 2021-08-01 (×2): 30 mg via SUBCUTANEOUS
  Filled 2021-07-30 (×2): qty 0.3

## 2021-07-30 MED ORDER — ACETAMINOPHEN 650 MG RE SUPP: 650.0000 mg | Freq: Four times a day (QID) | RECTAL | Status: DC | PRN

## 2021-07-30 MED ORDER — IOHEXOL 350 MG/ML SOLN
53.0000 mL | Freq: Once | INTRAVENOUS | Status: AC | PRN
  Administered 2021-07-30: 53 mL via INTRAVENOUS

## 2021-07-30 MED ORDER — PREDNISONE 20 MG PO TABS: 40.0000 mg | ORAL_TABLET | Freq: Every day | ORAL | Status: DC

## 2021-07-30 MED ORDER — FLUTICASONE-UMECLIDIN-VILANT 200-62.5-25 MCG/INH IN AEPB: 1.0000 | INHALATION_SPRAY | Freq: Every day | RESPIRATORY_TRACT | Status: DC

## 2021-07-30 NOTE — H&P (Addendum)
History and Physical   Jasmine [name] KYH:[id number] DOB: 04-09-1946 DOA: 07/30/2021  PCP: Biagio Borg, MD   Patient coming from: Home  Chief Complaint: Shortness of breath  HPI: Jasmine [name] is a 75 y.o. female with medical history significant of anxiety, depression, asthma, COPD, CHF, low back pain, diabetes, hypothyroidism, substance use, hearing loss who presents with ongoing worsening shortness of breath.  Patient has had worsening shortness of breath for the past 3 to 5 days is also had some intermittent abdominal and back pain.  EMS was called out as her symptoms had progressed and found to be saturating around 80% on her home oxygen of 3 to 4 L.  She received albuterol and Atrovent as well 125 mg of IV Solu-Medrol in route by EMS.  After that she was saturating around 90% on 3 L per report.  She also mentioned that she did not take her home medications today.  She does take her home medications and use her home oxygen and BiPAP.  Reports her current level edema is stable for her.  She denies fevers, chills, chest pain, abdominal pain, constipation, diarrhea, nausea, vomiting.   ED Course: Vital signs in the ED significant for requiring her home 3 L to maintain saturations and desaturating to around 80% with increase of heart rate to the 120s whenever she tries to ambulate despite interventions in the ED.  Lab work-up showed CMP with potassium 5.4, bicarb stable 34, chloride 96.  CBC with platelets 135.  Troponin negative x2.  BNP normal.  Respiratory panel flu COVID-negative.  Chest x-ray showed diffuse increased interstitial marking consistent with edema and small bilateral effusions.  CT a PE study was negative for PE did show trace bilateral effusions and her chronic emphysema and nodules which were stable.  Patient received a dose of Lasix and further albuterol in the ED.  Review of Systems: As per HPI otherwise all other systems reviewed and are negative.  Past Medical History:   Diagnosis Date   ALLERGIC RHINITIS 05/01/2007   Anxiety    Arthritis    "hands" (12/28/2015)   ASTHMA 05/01/2007   "since I was a child"   Cancer ([place])    LUNG   CHF (congestive heart failure) ([place])    Chronic bronchitis (HCC)    COPD (chronic obstructive pulmonary disease) ([place])    DEPRESSION 07/21/2008   DVT (deep venous thrombosis) ([place]) 1960s   LLE   FATIGUE 10/10/2010   HYPERTENSION 05/01/2007   HYPOTHYROIDISM 08/23/2009   s/p RAI   LUMBAR RADICULOPATHY, RIGHT 08/25/2008   On home oxygen therapy    "3-4L; qd; all the time" (12/28/2015)   OSTEOPOROSIS 05/01/2007   SHOULDER PAIN, LEFT 08/23/2009   SINUSITIS, CHRONIC 10/10/2010    Past Surgical History:  Procedure Laterality Date   APPENDECTOMY     DILATION AND CURETTAGE OF UTERUS  multiple   history of multiple dialations and curettages and miscarriages, unfortunately never carrying a child to term   ECTOPIC PREGNANCY SURGERY  "late '60s or early '70s   ELECTROCARDIOGRAM  06/20/2006   OOPHORECTOMY Right     Social History  reports that she quit smoking about 5 years ago. Her smoking use included cigarettes. She started smoking about 57 years ago. She has a 52.00 pack-year smoking history. She has never used smokeless tobacco. She reports that she does not currently use alcohol. She reports that she does not currently use drugs.  Allergies  Allergen Reactions   Alendronate Sodium Other (  See Comments)    Patient does not remember this reaction    Family History  Problem Relation Age of Onset   Lung cancer Father    Alcohol abuse Brother    Diabetes Brother    Hypertension Other    Stroke Mother    Thyroid disease Mother    Asthma Other        maternal aunts   Breast cancer Neg Hx   Reviewed on admission  Prior to Admission medications   Medication Sig Start Date End Date Taking? Authorizing Provider  albuterol (PROVENTIL) (2.5 MG/3ML) 0.083% nebulizer solution Take 3 mLs (2.5 mg total) by nebulization every 6  (six) hours as needed for wheezing or shortness of breath. 11/30/19   Biagio Borg, MD  albuterol (VENTOLIN HFA) 108 (90 Base) MCG/ACT inhaler INHALE 1 TO 2 PUFFS INTO THE LUNGS EVERY 6 HOURS AS NEEDED FOR WHEEZING OR SHORTNESS OF BREATH 03/28/21   Biagio Borg, MD  aspirin EC 81 MG EC tablet Take 1 tablet (81 mg total) by mouth daily. 03/27/16   Velvet Bathe, MD  atorvastatin (LIPITOR) 10 MG tablet TAKE 1 TABLET BY MOUTH DAILY. 07/12/21   Biagio Borg, MD  citalopram (CELEXA) 20 MG tablet TAKE ONE TABLET BY MOUTH DAILY 04/19/21   Biagio Borg, MD  Fluticasone-Umeclidin-Vilant (TRELEGY ELLIPTA) 200-62.5-25 MCG/INH AEPB Inhale 1 puff into the lungs daily. 04/19/21   Biagio Borg, MD  furosemide (LASIX) 40 MG tablet 1 tab by mouth twice per day 04/19/21   Biagio Borg, MD  ipratropium-albuterol (DUONEB) 0.5-2.5 (3) MG/3ML SOLN Take 3 mLs by nebulization every 6 (six) hours as needed. 03/27/20   Parrett, Fonnie Mu, NP  levothyroxine (SYNTHROID) 75 MCG tablet TAKE ONE TABLET BY MOUTH EARLY MORNING 07/23/21   Shamleffer, Melanie Crazier, MD  OXYGEN Inhale 3 L into the lungs. 24/7    [provider]  potassium chloride (KLOR-CON) 10 MEQ tablet TAKE 1 TABLET BY MOUTH 2 TIMES DAILY. 04/19/21   Biagio Borg, MD  predniSONE (DELTASONE) 5 MG tablet TAKE 1 TABLET BY MOUTH DAILY WITH BREAKFAST. 04/10/21   Rigoberto Noel, MD    Physical Exam: Vitals:   07/30/21 1915 07/30/21 1945 07/30/21 2015 07/30/21 2115  BP: 140/70 124/62 126/61 (!) 122/58  Pulse: 94 (!) 58 89 63  Resp: (!) 22  16 18   Temp:      TempSrc:      SpO2: 94% 100% 99% 99%   Physical Exam Constitutional:      General: She is not in acute distress.    Appearance: Normal appearance.  HENT:     Head: Normocephalic and atraumatic.     Mouth/Throat:     Mouth: Mucous membranes are dry.     Pharynx: Oropharynx is clear.  Eyes:     Extraocular Movements: Extraocular movements intact.     Pupils: Pupils are equal, round, and reactive to light.   Cardiovascular:     Rate and Rhythm: Normal rate and regular rhythm.     Pulses: Normal pulses.     Heart sounds: Normal heart sounds.  Pulmonary:     Effort: Pulmonary effort is normal. No respiratory distress.     Comments: Diffusely distant breath sounds Abdominal:     General: Bowel sounds are normal. There is no distension.     Palpations: Abdomen is soft.     Tenderness: There is no abdominal tenderness.  Musculoskeletal:        General:  No swelling or deformity.     Right lower leg: Edema present.     Left lower leg: Edema present.  Skin:    General: Skin is warm and dry.  Neurological:     General: No focal deficit present.     Mental Status: Mental status is at baseline.   Labs on Admission: I have personally reviewed following labs and imaging studies  CBC: Recent Labs  Lab 07/30/21 1520  WBC 7.3  NEUTROABS 5.2  HGB 13.7  HCT 45.9  MCV 104.6*  PLT 135*    Basic Metabolic Panel: Recent Labs  Lab 07/30/21 1520  NA 140  K 5.4*  CL 96*  CO2 34*  GLUCOSE 115*  BUN 12  CREATININE 0.78  CALCIUM 10.0    GFR: Estimated Creatinine Clearance: 48.1 mL/min (by C-G formula based on SCr of 0.78 mg/dL).  Liver Function Tests: Recent Labs  Lab 07/30/21 1520  AST 34  ALT 22  ALKPHOS 52  BILITOT 1.0  PROT 7.1  ALBUMIN 3.8    Urine analysis:    Component Value Date/Time   COLORURINE YELLOW 04/19/2021 1551   APPEARANCEUR CLEAR 04/19/2021 1551   LABSPEC 1.020 04/19/2021 1551   PHURINE 6.5 04/19/2021 1551   GLUCOSEU NEGATIVE 04/19/2021 1551   HGBUR NEGATIVE 04/19/2021 1551   [place] 04/19/2021 1551   BILIRUBINUR negative 08/11/2011 [place] 04/19/2021 1551   PROTEINUR 30 (A) 01/29/2020 0532   UROBILINOGEN 1.0 04/19/2021 1551   NITRITE NEGATIVE 04/19/2021 1551   LEUKOCYTESUR TRACE (A) 04/19/2021 1551    Radiological Exams on Admission: DG Chest 2 View  Result Date: 07/30/2021 CLINICAL DATA:  Shortness of breath  EXAM: CHEST - 2 VIEW COMPARISON:  Chest radiograph 12/20/2020 FINDINGS: The cardiomediastinal silhouette is within normal limits. There are diffusely increased interstitial markings throughout both lungs. Focal opacity projecting over the right upper lobe on the frontal projection is similar to prior studies and likely reflects scar. There is no other focal consolidation. There are small bilateral pleural effusions, new since 12/20/2020. There is no pneumothorax. There is no acute osseous abnormality. IMPRESSION: Diffusely increased interstitial markings suspicious for mild pulmonary interstitial edema with small bilateral pleural effusions superimposed on chronic changes. Electronically Signed   By: Valetta Mole M.D.   On: 07/30/2021 16:04   CT Angio Chest PE W and/or Wo Contrast  Result Date: 07/30/2021 CLINICAL DATA:  PE suspected, high prob. pt's husband called to report pt having increased SHOB , back and abd EXAM: CT ANGIOGRAPHY CHEST WITH CONTRAST TECHNIQUE: Multidetector CT imaging of the chest was performed using the standard protocol during bolus administration of intravenous contrast. Multiplanar CT image reconstructions and MIPs were obtained to evaluate the vascular anatomy. CONTRAST:  60mL OMNIPAQUE IOHEXOL 350 MG/ML SOLN COMPARISON:  CT abdomen pelvis 11/24/2018 FINDINGS: Cardiovascular: Satisfactory opacification of the pulmonary arteries to the segmental level. No evidence of pulmonary embolism. Normal heart size. No significant pericardial effusion. The thoracic aorta is normal in caliber. Mild atherosclerotic plaque of the thoracic aorta. Three-vessel coronary artery calcifications. Mediastinum/Nodes: No enlarged mediastinal, hilar, or axillary lymph nodes. Thyroid gland, trachea, and esophagus demonstrate no significant findings. Lungs/Pleura: Emphysematous changes. No focal consolidation. Stable 8 mm right upper lobe pulmonary nodule (7:48). Stable linear scarring (7:55). Similar-appearing  0.7 x 1.1 cm left upper lobe pulmonary nodule (7:43). Similar-appearing other pulmonary nodule within left upper lobe (7:48). No pulmonary mass. Interval development of bilateral trace pleural effusions. No pneumothorax. Upper Abdomen: No acute abnormality.  Musculoskeletal: No chest wall abnormality. No suspicious lytic or blastic osseous lesions. No acute displaced fracture. Review of the MIP images confirms the above findings. IMPRESSION: 1. No pulmonary embolus. 2. Interval development of bilateral trace pleural effusions. 3. Aortic Atherosclerosis (ICD10-I70.0) and Emphysema (ICD10-J43.9). Electronically Signed   By: Iven Finn M.D.   On: 07/30/2021 21:44    EKG: Independently reviewed.  Sinus rhythm at 77 bpm.  Similar to previous.  Assessment/Plan Principal Problem:   Postablative hypothyroidism Active Problems:   Depression with anxiety   Essential hypertension   Asthma   Chronic respiratory failure with hypoxia (HCC)   On home oxygen therapy   Chronic diastolic congestive heart failure (HCC)   Bilateral hearing loss   Diabetes mellitus due to underlying condition without complication, without long-term current use of insulin (HCC)   Steroid-dependent chronic obstructive pulmonary disease (HCC)   COPD exacerbation (HCC)  COPD exacerbation > Patient with significant COPD which is steroid-dependent and also uses BiPAP at night and with naps, also on 3 to 4 L chronic home O2. > Several days of worsening shortness of breath initially with low saturations for EMS now saturating okay on home oxygen however whenever she attempts to ambulate in the ED she desats to 80% and becomes tachycardic. > Has received IV Solu-Medrol, albuterol, Atrovent between EMS in the ED. > Is on chronic steroids daily 5 mg.  He will need long taper on discharge back to home dose. - Monitor on telemetry - Continue home nightly BiPAP - Prednisone 40 mg daily - Scheduled duo nebs - As needed albuterol -  Continue home Trelegy -Repeat ambulation with pulse ox in the morning  HTN CHF > Last echo in 2020 with EF greater than 06%, diastolic dysfunction, RV function normal. > Did have some evidence of edema on chest x-ray however this was not noted on CT study.  Also did receive IV Lasix in the ED.  However, BNP noted to be normal. - We will continue with home Lasix - Check magnesium - Trend renal function electrolytes  Hypothyroidism - Continue Synthroid  Diabetes > Not on any outpatient medications however will be receiving steroids. - SSI   Anxiety Depression - Continue home Celexa  Substance use > History of cocaine use  Hearing loss - Noted  DVT prophylaxis: Lovenox Code Status:   Full.  Clarified CODE STATUS with patient states that she is full code but does have a living will and states there is a time limit on it for recovery. Family Communication:  Niece paula contacted and updated by phone  Disposition Plan:   Patient is from:  Home  Anticipated DC to:  Home  Anticipated DC date:  1 to 3 days  Anticipated DC barriers: None  Consults called:  None  Admission status:  Observation, telemetry  Severity of Illness: The appropriate patient status for this patient is OBSERVATION. Observation status is judged to be reasonable and necessary in order to provide the required intensity of service to ensure the patient's safety. The patient's presenting symptoms, physical exam findings, and initial radiographic and laboratory data in the context of their medical condition is felt to place them at decreased risk for further clinical deterioration. Furthermore, it is anticipated that the patient will be medically stable for discharge from the hospital within 2 midnights of admission. The following factors support the patient status of observation.   " The patient's presenting symptoms include shortness of breath. " The physical exam findings include distant breath  sounds. " The  initial radiographic and laboratory data are  Lab work-up showed CMP with potassium 5.4, bicarb stable 34, chloride 96.  CBC with platelets 135.  Troponin negative x2.  BNP normal.  Respiratory panel flu COVID-negative.  Chest x-ray showed diffuse increased interstitial marking consistent with edema and small bilateral effusions.  CT a PE study was negative for PE did show trace bilateral effusions and her chronic emphysema and nodules which were stable.   Marcelyn Bruins MD [facility]  How to contact the [facility] Attending or Consulting provider [place] or covering provider during after hours [place], for this patient?   Check the care team in [facility] and look for a) attending/consulting [facility] provider listed and b) the [facility] team listed Log into [email or web address] and use [place]'s universal password to access. If you do not have the password, please contact the hospital operator. Locate the [facility] provider you are looking for under [facility] and page to a number that you can be directly reached. If you still have difficulty reaching the provider, please page the [facility] (Director on Call) for the Hospitalists listed on amion for assistance.  07/30/2021, 10:21 PM

## 2021-07-30 NOTE — ED Provider Notes (Signed)
Emergency Medicine Provider Triage Evaluation Note  Jasmine [name] , a 75 y.o. female  was evaluated in triage.  Pt complains of sob for the last few days. Also c/o nausea and decreased po intake.  Review of Systems  Positive: Sob, nausea Negative: vomiting  Physical Exam  BP (!) 116/59 (BP Location: Left Arm)   Pulse 82   Temp 99.1 F (37.3 C) (Oral)   Resp 20   SpO2 98%  Gen:   Awake, no distress  Resp:  tachypnea MSK:   Moves extremities without difficulty  Other:  Abd soft, decreased breath sounds throughout, ble edema  Medical Decision Making  Medically screening exam initiated at 3:20 PM.  Appropriate orders placed.  EMILLEE TALSMA was informed that the remainder of the evaluation will be completed by another provider, this initial triage assessment does not replace that evaluation, and the importance of remaining in the ED until their evaluation is complete.     Rodney Booze, PA-C 07/30/21 1521    Wyvonnia Dusky, MD 07/30/21 [phone]

## 2021-07-30 NOTE — ED Triage Notes (Signed)
BIB [facility] after pt's husband called to report pt having increased SHOB , back and abd. Discomfort. Per EMS, pt sat 88% on 2 L [place]. Pt on 02 at baseline. Pt given 5 mg albuter/0.5 atrovent,  125 mg solumedrol and was at 98% on 3 L [place]. Pt did not take medications today.    Hx: COPD

## 2021-07-30 NOTE — ED Notes (Signed)
PT o2 dropped to 80% and below with ambulation on oxygen. PT stated that she became SOB, with HR in the 120's.

## 2021-07-30 NOTE — ED Notes (Addendum)
Update given to [place] niece.

## 2021-07-30 NOTE — ED Provider Notes (Signed)
[facility] Provider Note   CSN: [id number] Arrival date & time: 07/30/21  1507     History Chief Complaint  Patient presents with   Shortness of Breath    Jasmine [name] is a 75 y.o. female.  HPI 75 year old female presents with shortness of breath.  This has been ongoing for about 3-5 days.  No significant cough.  She has been noticing some increasing leg swelling.  She denies fever or chest pain.  She is chronically on 3-4 L oxygen.  She wonders if her BiPAP being dirty could cause this.  Right now she is not feeling too bad though a lot of her symptoms seem to be exertional.  Past Medical History:  Diagnosis Date   ALLERGIC RHINITIS 05/01/2007   Anxiety    Arthritis    "hands" (12/28/2015)   ASTHMA 05/01/2007   "since I was a child"   Cancer ([place])    LUNG   CHF (congestive heart failure) ([place])    Chronic bronchitis ([place])    COPD (chronic obstructive pulmonary disease) ([place])    DEPRESSION 07/21/2008   DVT (deep venous thrombosis) ([place]) 1960s   LLE   FATIGUE 10/10/2010   HYPERTENSION 05/01/2007   HYPOTHYROIDISM 08/23/2009   s/p RAI   LUMBAR RADICULOPATHY, RIGHT 08/25/2008   On home oxygen therapy    "3-4L; qd; all the time" (12/28/2015)   OSTEOPOROSIS 05/01/2007   SHOULDER PAIN, LEFT 08/23/2009   SINUSITIS, CHRONIC 10/10/2010    Patient Active Problem List   Diagnosis Date Noted   COPD exacerbation ([place]) 07/30/2021   Diabetes mellitus due to underlying condition without complication, without long-term current use of insulin ([place]) 07/22/2021   Steroid-dependent chronic obstructive pulmonary disease ([place]) 07/22/2021   Postablative hypothyroidism 07/22/2021   Encounter for well adult exam with abnormal findings 04/20/2021   Malnutrition of moderate degree 08/16/2019   Respiratory acidosis 03/07/2019   Goals of care, counseling/discussion 09/20/2018   Edema due to congestive heart failure ([place]) 07/27/2018   Palliative care by  specialist    Shortness of breath    Chronic low back pain 04/30/2018   COPD with acute exacerbation ([place])    Hypothyroidism    Anxiety    Respiratory failure with hypercapnia ([place]) 03/24/2018   Hypercarbia 03/22/2018   Polysubstance abuse ([place]) 01/26/2018   Aspiration into airway 06/03/2017   Rash 07/12/2016   Bilateral hearing loss 06/05/2016   Hyperglycemia 05/21/2016   Abnormal LFTs 03/24/2016   Solitary pulmonary nodule 01/09/2016   Chronic respiratory failure with hypoxia ([place])    On home oxygen therapy    Chronic diastolic congestive heart failure ([place])    Bacteremia Step viridans 12/27/2015   Former smoker 12/25/2015   Hypothyroidism following radioiodine therapy 09/26/2014   Protein-calorie malnutrition, severe ([place]) 06/23/2014   Cor pulmonale ([place]) 01/12/2013   COPD (chronic obstructive pulmonary disease) ([place]) 11/29/2011   Macrocytosis without anemia 11/09/2011   Palliative care encounter 04/09/2011   SINUSITIS, CHRONIC 10/10/2010   Fatigue 10/10/2010   Depression with anxiety 07/21/2008   Essential hypertension 05/01/2007   ALLERGIC RHINITIS 05/01/2007   Asthma 05/01/2007   OSTEOPOROSIS 05/01/2007    Past Surgical History:  Procedure Laterality Date   APPENDECTOMY     DILATION AND CURETTAGE OF UTERUS  multiple   history of multiple dialations and curettages and miscarriages, unfortunately never carrying a child to term   ECTOPIC PREGNANCY SURGERY  "late '60s or early '70s   ELECTROCARDIOGRAM  06/20/2006  OOPHORECTOMY Right      OB History     Gravida  5   Para      Term      Preterm      AB  5   Living         SAB  4   IAB      Ectopic  1   Multiple      Live Births              Family History  Problem Relation Age of Onset   Lung cancer Father    Alcohol abuse Brother    Diabetes Brother    Hypertension Other    Stroke Mother    Thyroid disease Mother    Asthma Other        maternal aunts   Breast cancer Neg Hx      Social History   Tobacco Use   Smoking status: Former    Packs/day: 1.00    Years: 52.00    Pack years: 52.00    Types: Cigarettes    Start date: 1965    Quit date: 07/20/2016    Years since quitting: 5.0   Smokeless tobacco: Never  Vaping Use   Vaping Use: Never used  Substance Use Topics   Alcohol use: Not Currently   Drug use: Not Currently    Home Medications Prior to Admission medications   Medication Sig Start Date End Date Taking? Authorizing Provider  albuterol (PROVENTIL) (2.5 MG/3ML) 0.083% nebulizer solution Take 3 mLs (2.5 mg total) by nebulization every 6 (six) hours as needed for wheezing or shortness of breath. 11/30/19   Biagio Borg, MD  albuterol (VENTOLIN HFA) 108 (90 Base) MCG/ACT inhaler INHALE 1 TO 2 PUFFS INTO THE LUNGS EVERY 6 HOURS AS NEEDED FOR WHEEZING OR SHORTNESS OF BREATH 03/28/21   Biagio Borg, MD  aspirin EC 81 MG EC tablet Take 1 tablet (81 mg total) by mouth daily. 03/27/16   Velvet Bathe, MD  atorvastatin (LIPITOR) 10 MG tablet TAKE 1 TABLET BY MOUTH DAILY. 07/12/21   Biagio Borg, MD  citalopram (CELEXA) 20 MG tablet TAKE ONE TABLET BY MOUTH DAILY 04/19/21   Biagio Borg, MD  Fluticasone-Umeclidin-Vilant (TRELEGY ELLIPTA) 200-62.5-25 MCG/INH AEPB Inhale 1 puff into the lungs daily. 04/19/21   Biagio Borg, MD  furosemide (LASIX) 40 MG tablet 1 tab by mouth twice per day 04/19/21   Biagio Borg, MD  ipratropium-albuterol (DUONEB) 0.5-2.5 (3) MG/3ML SOLN Take 3 mLs by nebulization every 6 (six) hours as needed. 03/27/20   Parrett, Fonnie Mu, NP  levothyroxine (SYNTHROID) 75 MCG tablet TAKE ONE TABLET BY MOUTH EARLY MORNING 07/23/21   Shamleffer, Melanie Crazier, MD  OXYGEN Inhale 3 L into the lungs. 24/7    [provider]  potassium chloride (KLOR-CON) 10 MEQ tablet TAKE 1 TABLET BY MOUTH 2 TIMES DAILY. 04/19/21   Biagio Borg, MD  predniSONE (DELTASONE) 5 MG tablet TAKE 1 TABLET BY MOUTH DAILY WITH BREAKFAST. 04/10/21   Rigoberto Noel, MD     Allergies    Alendronate sodium  Review of Systems   Review of Systems  Constitutional:  Negative for fever.  Respiratory:  Positive for shortness of breath. Negative for cough.   Cardiovascular:  Positive for leg swelling. Negative for chest pain.  All other systems reviewed and are negative.  Physical Exam Updated Vital Signs BP (!) 122/58   Pulse 63   Temp 99.1  F (37.3 C) (Oral)   Resp 18   SpO2 99%   Physical Exam Vitals and nursing note reviewed.  Constitutional:      Appearance: She is well-developed.  HENT:     Head: Normocephalic and atraumatic.     Right Ear: External ear normal.     Left Ear: External ear normal.     Nose: Nose normal.  Eyes:     General:        Right eye: No discharge.        Left eye: No discharge.  Cardiovascular:     Rate and Rhythm: Normal rate and regular rhythm.     Heart sounds: Normal heart sounds.  Pulmonary:     Effort: Pulmonary effort is normal. No tachypnea or accessory muscle usage.     Breath sounds: Decreased breath sounds (diffuse) present. No wheezing.  Abdominal:     Palpations: Abdomen is soft.     Tenderness: There is no abdominal tenderness.  Musculoskeletal:     Right lower leg: Edema present.     Left lower leg: Edema present.     Comments: BLE pitting edema in lower legs, ankles, feet  Skin:    General: Skin is warm and dry.  Neurological:     Mental Status: She is alert.  Psychiatric:        Mood and Affect: Mood is not anxious.    ED Results / Procedures / Treatments   Labs (all labs ordered are listed, but only abnormal results are displayed) Labs Reviewed  COMPREHENSIVE METABOLIC PANEL - Abnormal; Notable for the following components:      Result Value   Potassium 5.4 (*)    Chloride 96 (*)    CO2 34 (*)    Glucose, Bld 115 (*)    All other components within normal limits  CBC WITH DIFFERENTIAL/PLATELET - Abnormal; Notable for the following components:   MCV 104.6 (*)    MCHC 29.8 (*)     Platelets 135 (*)    All other components within normal limits  RESP PANEL BY RT-PCR (FLU A&B, COVID) ARPGX2  RESPIRATORY PANEL BY PCR  BRAIN NATRIURETIC PEPTIDE  MAGNESIUM  BASIC METABOLIC PANEL  CBC  TROPONIN I (HIGH SENSITIVITY)  TROPONIN I (HIGH SENSITIVITY)    EKG EKG Interpretation  Date/Time:  Tuesday July 30 2021 15:20:41 EDT Ventricular Rate:  77 PR Interval:  152 QRS Duration: 80 QT Interval:  362 QTC Calculation: 409 R Axis:   78 Text Interpretation: Normal sinus rhythm Right atrial enlargement Possible Anterior infarct , age undetermined similar to 2021 Confirmed by Sherwood Gambler [phone]) on 07/30/2021 4:23:53 PM  Radiology DG Chest 2 View  Result Date: 07/30/2021 CLINICAL DATA:  Shortness of breath EXAM: CHEST - 2 VIEW COMPARISON:  Chest radiograph 12/20/2020 FINDINGS: The cardiomediastinal silhouette is within normal limits. There are diffusely increased interstitial markings throughout both lungs. Focal opacity projecting over the right upper lobe on the frontal projection is similar to prior studies and likely reflects scar. There is no other focal consolidation. There are small bilateral pleural effusions, new since 12/20/2020. There is no pneumothorax. There is no acute osseous abnormality. IMPRESSION: Diffusely increased interstitial markings suspicious for mild pulmonary interstitial edema with small bilateral pleural effusions superimposed on chronic changes. Electronically Signed   By: Valetta Mole M.D.   On: 07/30/2021 16:04   CT Angio Chest PE W and/or Wo Contrast  Result Date: 07/30/2021 CLINICAL DATA:  PE suspected, high prob. pt's husband  called to report pt having increased SHOB , back and abd EXAM: CT ANGIOGRAPHY CHEST WITH CONTRAST TECHNIQUE: Multidetector CT imaging of the chest was performed using the standard protocol during bolus administration of intravenous contrast. Multiplanar CT image reconstructions and MIPs were obtained to evaluate the  vascular anatomy. CONTRAST:  19mL OMNIPAQUE IOHEXOL 350 MG/ML SOLN COMPARISON:  CT abdomen pelvis 11/24/2018 FINDINGS: Cardiovascular: Satisfactory opacification of the pulmonary arteries to the segmental level. No evidence of pulmonary embolism. Normal heart size. No significant pericardial effusion. The thoracic aorta is normal in caliber. Mild atherosclerotic plaque of the thoracic aorta. Three-vessel coronary artery calcifications. Mediastinum/Nodes: No enlarged mediastinal, hilar, or axillary lymph nodes. Thyroid gland, trachea, and esophagus demonstrate no significant findings. Lungs/Pleura: Emphysematous changes. No focal consolidation. Stable 8 mm right upper lobe pulmonary nodule (7:48). Stable linear scarring (7:55). Similar-appearing 0.7 x 1.1 cm left upper lobe pulmonary nodule (7:43). Similar-appearing other pulmonary nodule within left upper lobe (7:48). No pulmonary mass. Interval development of bilateral trace pleural effusions. No pneumothorax. Upper Abdomen: No acute abnormality. Musculoskeletal: No chest wall abnormality. No suspicious lytic or blastic osseous lesions. No acute displaced fracture. Review of the MIP images confirms the above findings. IMPRESSION: 1. No pulmonary embolus. 2. Interval development of bilateral trace pleural effusions. 3. Aortic Atherosclerosis (ICD10-I70.0) and Emphysema (ICD10-J43.9). Electronically Signed   By: Iven Finn M.D.   On: 07/30/2021 21:44    Procedures Procedures   Medications Ordered in ED Medications  levothyroxine (SYNTHROID) tablet 75 mcg (has no administration in time range)  citalopram (CELEXA) tablet 20 mg (has no administration in time range)  furosemide (LASIX) tablet 40 mg (has no administration in time range)  atorvastatin (LIPITOR) tablet 10 mg (has no administration in time range)  Fluticasone-Umeclidin-Vilant 200-62.5-25 MCG/INH AEPB 1 puff (has no administration in time range)  predniSONE (DELTASONE) tablet 40 mg (has no  administration in time range)  ipratropium-albuterol (DUONEB) 0.5-2.5 (3) MG/3ML nebulizer solution 3 mL (has no administration in time range)  albuterol (PROVENTIL) (2.5 MG/3ML) 0.083% nebulizer solution 2.5 mg (has no administration in time range)  enoxaparin (LOVENOX) injection 40 mg (has no administration in time range)  sodium chloride flush (NS) 0.9 % injection 3 mL (has no administration in time range)  acetaminophen (TYLENOL) tablet 650 mg (has no administration in time range)    Or  acetaminophen (TYLENOL) suppository 650 mg (has no administration in time range)  polyethylene glycol (MIRALAX / GLYCOLAX) packet 17 g (has no administration in time range)  albuterol (VENTOLIN HFA) 108 (90 Base) MCG/ACT inhaler 4 puff (4 puffs Inhalation Given 07/30/21 1654)  furosemide (LASIX) injection 40 mg (40 mg Intravenous Given 07/30/21 1816)  albuterol (PROVENTIL) (2.5 MG/3ML) 0.083% nebulizer solution 5 mg (5 mg Nebulization Given 07/30/21 2033)  iohexol (OMNIPAQUE) 350 MG/ML injection 53 mL (53 mLs Intravenous Contrast Given 07/30/21 2136)    ED Course  I have reviewed the triage vital signs and the nursing notes.  Pertinent labs & imaging results that were available during my care of the patient were reviewed by me and considered in my medical decision making (see chart for details).    MDM Rules/Calculators/A&P                           Patient with acute on chronic respiratory failure with hypoxia.  She is not distressed but any small movement like getting up to go the bathroom causes her to get hypoxic on her home baseline  O2.  She was given some albuterol but not attended relief.  She was given Solu-Medrol already by EMS.  CT and chest x-ray seem dictates some fluid overload and she was given Lasix as well.  No PE or pneumonia.  Given her brittle respiratory failure I think it would be reasonable to admit for further optimization.  Dr. Trilby Drummer to admit. Final Clinical Impression(s) / ED  Diagnoses Final diagnoses:  Acute on chronic respiratory failure with hypoxia [facility])    Rx / DC Orders ED Discharge Orders     None        Sherwood Gambler, MD 07/30/21 2219

## 2021-07-31 ENCOUNTER — Observation Stay (HOSPITAL_BASED_OUTPATIENT_CLINIC_OR_DEPARTMENT_OTHER): Payer: Medicare HMO

## 2021-07-31 ENCOUNTER — Encounter (HOSPITAL_COMMUNITY): Payer: Self-pay | Admitting: Internal Medicine

## 2021-07-31 DIAGNOSIS — R0602 Shortness of breath: Secondary | ICD-10-CM

## 2021-07-31 DIAGNOSIS — J9621 Acute and chronic respiratory failure with hypoxia: Secondary | ICD-10-CM

## 2021-07-31 DIAGNOSIS — J441 Chronic obstructive pulmonary disease with (acute) exacerbation: Secondary | ICD-10-CM | POA: Diagnosis not present

## 2021-07-31 DIAGNOSIS — E890 Postprocedural hypothyroidism: Secondary | ICD-10-CM | POA: Diagnosis not present

## 2021-07-31 DIAGNOSIS — R609 Edema, unspecified: Secondary | ICD-10-CM | POA: Diagnosis not present

## 2021-07-31 DIAGNOSIS — I5032 Chronic diastolic (congestive) heart failure: Secondary | ICD-10-CM | POA: Diagnosis not present

## 2021-07-31 LAB — ECHOCARDIOGRAM COMPLETE
Area-P 1/2: 3.42 cm2
Calc EF: 78.8 %
S' Lateral: 1.6 cm
Single Plane A2C EF: 79.5 %
Single Plane A4C EF: 77.6 %

## 2021-07-31 LAB — CBC
HCT: 39.5 % (ref 36.0–46.0)
Hemoglobin: 11.6 g/dL — ABNORMAL LOW (ref 12.0–15.0)
MCH: 30.4 pg (ref 26.0–34.0)
MCHC: 29.4 g/dL — ABNORMAL LOW (ref 30.0–36.0)
MCV: 103.4 fL — ABNORMAL HIGH (ref 80.0–100.0)
Platelets: 138 10*3/uL — ABNORMAL LOW (ref 150–400)
RBC: 3.82 MIL/uL — ABNORMAL LOW (ref 3.87–5.11)
RDW: 14.3 % (ref 11.5–15.5)
WBC: 7.7 10*3/uL (ref 4.0–10.5)
nRBC: 0 % (ref 0.0–0.2)

## 2021-07-31 LAB — BASIC METABOLIC PANEL
Anion gap: 10 (ref 5–15)
BUN: 12 mg/dL (ref 8–23)
CO2: 38 mmol/L — ABNORMAL HIGH (ref 22–32)
Calcium: 9.6 mg/dL (ref 8.9–10.3)
Chloride: 90 mmol/L — ABNORMAL LOW (ref 98–111)
Creatinine, Ser: 0.74 mg/dL (ref 0.44–1.00)
GFR, Estimated: >60 mL/min (ref >60)
Glucose, Bld: 149 mg/dL — ABNORMAL HIGH (ref 70–99)
Potassium: 4.7 mmol/L (ref 3.5–5.1)
Sodium: 138 mmol/L (ref 135–145)

## 2021-07-31 LAB — GLUCOSE, CAPILLARY
Glucose-Capillary: 175 mg/dL — ABNORMAL HIGH (ref 70–99)
Glucose-Capillary: 215 mg/dL — ABNORMAL HIGH (ref 70–99)

## 2021-07-31 LAB — CBG MONITORING, ED
Glucose-Capillary: 118 mg/dL — ABNORMAL HIGH (ref 70–99)
Glucose-Capillary: 127 mg/dL — ABNORMAL HIGH (ref 70–99)

## 2021-07-31 LAB — MAGNESIUM: Magnesium: 1.4 mg/dL — ABNORMAL LOW (ref 1.7–2.4)

## 2021-07-31 MED ORDER — MAGNESIUM SULFATE 4 GM/100ML IV SOLN
4.0000 g | Freq: Once | INTRAVENOUS | Status: AC
  Administered 2021-07-31: 4 g via INTRAVENOUS
  Filled 2021-07-31: qty 100

## 2021-07-31 MED ORDER — METHYLPREDNISOLONE SODIUM SUCC 125 MG IJ SOLR
80.0000 mg | Freq: Every day | INTRAMUSCULAR | Status: DC
  Administered 2021-07-31 – 2021-08-01 (×2): 80 mg via INTRAVENOUS
  Filled 2021-07-31 (×2): qty 2

## 2021-07-31 MED ORDER — INFLUENZA VAC A&B SA ADJ QUAD 0.5 ML IM PRSY
0.5000 mL | PREFILLED_SYRINGE | INTRAMUSCULAR | Status: AC
  Administered 2021-08-01: 0.5 mL via INTRAMUSCULAR
  Filled 2021-07-31: qty 0.5

## 2021-07-31 MED ORDER — ARFORMOTEROL TARTRATE 15 MCG/2ML IN NEBU
15.0000 ug | INHALATION_SOLUTION | Freq: Two times a day (BID) | RESPIRATORY_TRACT | Status: DC
  Administered 2021-07-31 – 2021-08-01 (×3): 15 ug via RESPIRATORY_TRACT
  Filled 2021-07-31 (×3): qty 2

## 2021-07-31 MED ORDER — IPRATROPIUM-ALBUTEROL 0.5-2.5 (3) MG/3ML IN SOLN
3.0000 mL | Freq: Three times a day (TID) | RESPIRATORY_TRACT | Status: DC
  Administered 2021-07-31 – 2021-08-01 (×5): 3 mL via RESPIRATORY_TRACT
  Filled 2021-07-31 (×5): qty 3

## 2021-07-31 MED ORDER — FUROSEMIDE 10 MG/ML IJ SOLN
40.0000 mg | Freq: Two times a day (BID) | INTRAMUSCULAR | Status: DC
  Administered 2021-07-31 – 2021-08-01 (×3): 40 mg via INTRAVENOUS
  Filled 2021-07-31 (×3): qty 4

## 2021-07-31 MED ORDER — BUDESONIDE 0.25 MG/2ML IN SUSP
0.2500 mg | Freq: Two times a day (BID) | RESPIRATORY_TRACT | Status: DC
  Administered 2021-07-31 – 2021-08-01 (×3): 0.25 mg via RESPIRATORY_TRACT
  Filled 2021-07-31 (×3): qty 2

## 2021-07-31 NOTE — ED Notes (Signed)
Per RN during report, pt allowed to take BiPAP off while awake. Pt woke up and removed BiPAP and placed on 4L [place], sats 91%.

## 2021-07-31 NOTE — ED Notes (Signed)
Pt's niece at the bedside, updated.

## 2021-07-31 NOTE — ED Notes (Signed)
Breakfast order placed ?

## 2021-07-31 NOTE — Progress Notes (Signed)
PT eval complete/documentation pending. Very close to her baseline and well equipped- should only need OP PT referral at DC.   Windell Norfolk, DPT, PN2   Supplemental Physical Therapist [place]    Pager [phone] Acute Rehab Office [phone]

## 2021-07-31 NOTE — Progress Notes (Signed)
Lower extremity venous bilateral study completed.   Please see CV Proc for preliminary results.   Delshon Blanchfield, RDMS, RVT  

## 2021-07-31 NOTE — Evaluation (Signed)
Physical Therapy Evaluation Patient Details Name: Jasmine [name] MRN: [id number] DOB: [birth year] Today's Date: 07/31/2021  History of Present Illness  75yo female who presented on 10/11 with SOB, hypoxic down to 80% on 3-4LPM. PE negative. Found to be in COPD exacerbation. PMH lung CA, CHF, chronic bronchitis, COPD, hx DVT, HTN, lumbar radiculopathy, osteoporosis  Clinical Impression   Patient received on ED stretcher, pleasant and cooperative; HOH and verbose, needed occasional cues to stay on task at hand. Able to mobilize very well today on a supervision to min guard basis with no device, does prefer to have at least one UE supported but tells me she usually uses rollator at baseline. Vitals generally stable on 3LPM today, but did have occasional drop to 85% for which she needed cues for PLB. Left in stretcher with all needs met. Will continue to follow- anticipate she will do well overall, will benefit from skilled OP PT f/u moving forward.        Recommendations for follow up therapy are one component of a multi-disciplinary discharge planning process, led by the attending physician.  Recommendations may be updated based on patient status, additional functional criteria and insurance authorization.  Follow Up Recommendations Outpatient PT    Equipment Recommendations  None recommended by PT (well equipped)    Recommendations for Other Services       Precautions / Restrictions Precautions Precautions: Fall;Other (comment) Precaution Comments: watch sats; on 3-4LPM at baseline; HOH Restrictions Weight Bearing Restrictions: No      Mobility  Bed Mobility Overal bed mobility: Modified Independent             General bed mobility comments: head of stretcher elevated, extra time and effort    Transfers Overall transfer level: Needs assistance Equipment used: None Transfers: Sit to/from Stand Sit to Stand: Supervision         General transfer comment: S for safety  from higher surface but able to manage transitions well  Ambulation/Gait Ambulation/Gait assistance: Min guard Gait Distance (Feet): 40 Feet Assistive device: None Gait Pattern/deviations: Step-through pattern;Narrow base of support;Drifts right/left Gait velocity: decreased   General Gait Details: generally steady but does prefer single UE support on stretcher or counter; VSS on 3LPM. Reports she usually uses rollator for majority of her mobility.  Stairs            Wheelchair Mobility    Modified Rankin (Stroke Patients Only)       Balance Overall balance assessment: Needs assistance Sitting-balance support: No upper extremity supported;Feet unsupported Sitting balance-Leahy Scale: Normal     Standing balance support: Single extremity supported;During functional activity                                 Pertinent Vitals/Pain Pain Assessment: No/denies pain    Home Living Family/patient expects to be discharged to:: Private residence Living Arrangements: Spouse/significant other Available Help at Discharge: Family;Available 24 hours/day Type of Home: House Home Access: Ramped entrance     Home Layout: One level Home Equipment: Walker - 2 wheels;Walker - 4 wheels;Wheelchair - manual Additional Comments: 3LPM home O2 baseline; mostly uses rollator; has had a couple falls in the bathroom recently but had no injuries; husband does most of the driving; manages own meds and bills    Prior Function Level of Independence: Independent with assistive device(s)               Hand Dominance  Extremity/Trunk Assessment   Upper Extremity Assessment Upper Extremity Assessment: Generalized weakness    Lower Extremity Assessment Lower Extremity Assessment: Generalized weakness    Cervical / Trunk Assessment Cervical / Trunk Assessment: Kyphotic  Communication   Communication: HOH  Cognition Arousal/Alertness: Awake/alert Behavior During  Therapy: WFL for tasks assessed/performed Overall Cognitive Status: Within Functional Limits for tasks assessed                                        General Comments      Exercises     Assessment/Plan    PT Assessment Patient needs continued PT services  PT Problem List Decreased strength;Decreased activity tolerance;Decreased safety awareness;Decreased balance;Decreased mobility;Cardiopulmonary status limiting activity       PT Treatment Interventions DME instruction;Balance training;Gait training;Stair training;Functional mobility training;Patient/family education;Therapeutic activities;Therapeutic exercise    PT Goals (Current goals can be found in the Care Plan section)  Acute Rehab PT Goals Patient Stated Goal: go home, go back to PT and get stronger PT Goal Formulation: With patient Time For Goal Achievement: 08/14/21 Potential to Achieve Goals: Good    Frequency Min 3X/week   Barriers to discharge        Co-evaluation               AM-PAC PT "6 Clicks" Mobility  Outcome Measure Help needed turning from your back to your side while in a flat bed without using bedrails?: None Help needed moving from lying on your back to sitting on the side of a flat bed without using bedrails?: None Help needed moving to and from a bed to a chair (including a wheelchair)?: A Little Help needed standing up from a chair using your arms (e.g., wheelchair or bedside chair)?: A Little Help needed to walk in hospital room?: A Little Help needed climbing 3-5 steps with a railing? : A Little 6 Click Score: 20    End of Session Equipment Utilized During Treatment: Gait belt Activity Tolerance: Patient tolerated treatment well Patient left: with call bell/phone within reach;Other (comment) (on ED stretcher) Nurse Communication: Mobility status PT Visit Diagnosis: Muscle weakness (generalized) (M62.81);Unsteadiness on feet (R26.81);History of falling (Z91.81)     Time: 6728-9791 PT Time Calculation (min) (ACUTE ONLY): 41 min   Charges:   PT Evaluation $PT Eval Moderate Complexity: 1 Mod PT Treatments $Gait Training: 8-22 mins $Therapeutic Activity: 8-22 mins       Windell Norfolk, DPT, PN2   Supplemental Physical Therapist [place]    Pager [phone] Acute Rehab Office [phone]

## 2021-07-31 NOTE — ED Notes (Signed)
Pt was calling out to the nursing station.  When I entered the room pt. Did not have her O2 in saturations were in the 50's, Placed [place] back on Pt. And pt steady went back up to low 90's

## 2021-07-31 NOTE — Progress Notes (Signed)
  Echocardiogram 2D Echocardiogram has been performed.  Bobbye Charleston 07/31/2021, 3:28 PM

## 2021-07-31 NOTE — Progress Notes (Signed)
PROGRESS NOTE        PATIENT DETAILS Name: Jasmine [name] Age: 75 y.o. Sex: female Date of Birth: 1946-10-07 Admit Date: 07/30/2021 Admitting Physician Marcelyn Bruins, MD LSL:HTDS, Hunt Oris, MD  Brief Narrative: Patient is a 75 y.o. female with history of COPD-on home O2 3-4 L, prior history of DVT, HTN, HLD, hypothyroidism who presented to the hospital with acute respiratory distress-found to have COPD exacerbation and subsequently admitted to the hospitalist service.  Subjective: Feels better than on initial presentation-still not yet back to baseline.  Has significant lower extremity edema for the past few days.  Objective: Vitals: Blood pressure 122/66, pulse 83, temperature 99.1 F (37.3 C), temperature source Oral, resp. rate 14, SpO2 99 %.   Exam: Gen Exam:Alert awake-not in any distress HEENT:atraumatic, normocephalic Chest: Diminished entry at bases-still with scattered rhonchi. CVS:S1S2 regular Abdomen:soft non tender, non distended Extremities:++ edema Neurology: Non focal Skin: no rash  Pertinent Labs/Radiology: WBC: 7.7 Hb: 11.6 Na: 138 K: 4.7 Creatinine: 0.74 Mg: 1.4  10/11>> CT angio chest: No PE  Assessment/Plan: COPD exacerbation: Improved-however not at baseline-still wheezing-continue IV steroids-scheduled bronchodilators-reassess on 10/13.  HFpEF with exacerbation: Significant lower extremity edema-in spite of being on oral Lasix in the outpatient setting-acknowledges significant exertional dyspnea over the past several weeks.  We will change to IV Lasix-obtain updated echo-as could have developed cor pulmonale.  Bilateral lower extremity swelling: See above regarding diuretics-has history of DVT-we will check lower extremity Dopplers.  Recent UA in July without significant proteinuria-LFTs/albumin within normal limits on admission.  Chronic hypoxic respiratory failure: Back on 3-4 L of oxygen this  morning.  Hypomagnesemia: We will replete and recheck  Hypothyroidism: Continue Synthroid-recent TSH was normal.  HLD: Continue statin  DM-2 (A1c 6.3 on 7/1): Diet controlled at home-continue SSI as patient on steroids.  Follow CBGs.  Recent Labs    07/31/21 0820 07/31/21 1148  GLUCAP 118* 127*    RUL nodule/LUL nodule: Chronic issue per radiology-stable for monitoring in the outpatient setting.   Procedures: None Consults: None DVT Prophylaxis: Lovenox Code Status:Full code  Family Communication: None at bedside  Time spent: 35 minutes-Greater than 50% of this time was spent in counseling, explanation of diagnosis, planning of further management, and coordination of care.  Diet: Diet Order             Diet heart healthy/carb modified Room service appropriate? Yes; Fluid consistency: Thin  Diet effective now                      Disposition Plan: Status is: Observation  The patient will require care spanning > 2 midnights and should be moved to inpatient because: Inpatient level of care appropriate due to severity of illness  Dispo: The patient is from: Home              Anticipated d/c is to: Home              Patient currently is not medically stable to d/c.   Difficult to place patient No    Barriers to Discharge: COPD exacerbation-noted at baseline-significant volume overload-needs IV diuretics before consideration of discharge.  Also needs PT/OT eval to determine safe disposition given exertional dyspnea/weakness.  Antimicrobial agents: Anti-infectives (From admission, onward)    None        MEDICATIONS: Scheduled  Meds:  arformoterol  15 mcg Nebulization BID   atorvastatin  10 mg Oral Daily   budesonide (PULMICORT) nebulizer solution  0.25 mg Nebulization BID   citalopram  20 mg Oral Daily   enoxaparin (LOVENOX) injection  30 mg Subcutaneous Q24H   furosemide  40 mg Intravenous BID   insulin aspart  0-9 Units Subcutaneous TID WC    ipratropium-albuterol  3 mL Nebulization TID   levothyroxine  75 mcg Oral Q0600   methylPREDNISolone (SOLU-MEDROL) injection  80 mg Intravenous Daily   sodium chloride flush  3 mL Intravenous Q12H   umeclidinium bromide  1 puff Inhalation Daily   Continuous Infusions: PRN Meds:.acetaminophen **OR** acetaminophen, albuterol, polyethylene glycol   I have personally reviewed following labs and imaging studies  LABORATORY DATA: CBC: Recent Labs  Lab 07/30/21 1520 07/31/21 0459  WBC 7.3 7.7  NEUTROABS 5.2  --   HGB 13.7 11.6*  HCT 45.9 39.5  MCV 104.6* 103.4*  PLT 135* 138*    Basic Metabolic Panel: Recent Labs  Lab 07/30/21 1520 07/31/21 0459  NA 140 138  K 5.4* 4.7  CL 96* 90*  CO2 34* 38*  GLUCOSE 115* 149*  BUN 12 12  CREATININE 0.78 0.74  CALCIUM 10.0 9.6  MG  --  1.4*    GFR: Estimated Creatinine Clearance: 48.1 mL/min (by C-G formula based on SCr of 0.74 mg/dL).  Liver Function Tests: Recent Labs  Lab 07/30/21 1520  AST 34  ALT 22  ALKPHOS 52  BILITOT 1.0  PROT 7.1  ALBUMIN 3.8   No results for input(s): LIPASE, AMYLASE in the last 168 hours. No results for input(s): AMMONIA in the last 168 hours.  Coagulation Profile: No results for input(s): INR, PROTIME in the last 168 hours.  Cardiac Enzymes: No results for input(s): CKTOTAL, CKMB, CKMBINDEX, TROPONINI in the last 168 hours.  BNP (last 3 results) Recent Labs    01/02/21 1550  PROBNP 19.0    Lipid Profile: No results for input(s): CHOL, HDL, LDLCALC, TRIG, CHOLHDL, LDLDIRECT in the last 72 hours.  Thyroid Function Tests: No results for input(s): TSH, T4TOTAL, FREET4, T3FREE, THYROIDAB in the last 72 hours.  Anemia Panel: No results for input(s): VITAMINB12, FOLATE, FERRITIN, TIBC, IRON, RETICCTPCT in the last 72 hours.  Urine analysis:    Component Value Date/Time   COLORURINE YELLOW 04/19/2021 1551   APPEARANCEUR CLEAR 04/19/2021 1551   LABSPEC 1.020 04/19/2021 1551    PHURINE 6.5 04/19/2021 1551   GLUCOSEU NEGATIVE 04/19/2021 1551   HGBUR NEGATIVE 04/19/2021 1551   BILIRUBINUR NEGATIVE 04/19/2021 1551   BILIRUBINUR negative 08/11/2011 1358   KETONESUR NEGATIVE 04/19/2021 1551   PROTEINUR 30 (A) 01/29/2020 0532   UROBILINOGEN 1.0 04/19/2021 1551   NITRITE NEGATIVE 04/19/2021 1551   LEUKOCYTESUR TRACE (A) 04/19/2021 1551    Sepsis Labs: Lactic Acid, Venous    Component Value Date/Time   LATICACIDVEN 0.7 03/01/2020 1325    MICROBIOLOGY: Recent Results (from the past 240 hour(s))  Resp Panel by RT-PCR (Flu A&B, Covid) Nasopharyngeal Swab     Status: None   Collection Time: 07/30/21  3:21 PM   Specimen: Nasopharyngeal Swab; Nasopharyngeal(NP) swabs in vial transport medium  Result Value Ref Range Status   SARS Coronavirus 2 by RT PCR NEGATIVE NEGATIVE Final    Comment: (NOTE) SARS-CoV-2 target nucleic acids are NOT DETECTED.  The SARS-CoV-2 RNA is generally detectable in upper respiratory specimens during the acute phase of infection. The lowest concentration of SARS-CoV-2 viral copies  this assay can detect is 138 copies/mL. A negative result does not preclude SARS-Cov-2 infection and should not be used as the sole basis for treatment or other patient management decisions. A negative result may occur with  improper specimen collection/handling, submission of specimen other than nasopharyngeal swab, presence of viral mutation(s) within the areas targeted by this assay, and inadequate number of viral copies(<138 copies/mL). A negative result must be combined with clinical observations, patient history, and epidemiological information. The expected result is Negative.  Fact Sheet for Patients:  EntrepreneurPulse.com.au  Fact Sheet for Healthcare Providers:  IncredibleEmployment.be  This test is no t yet approved or cleared by the [place] FDA and  has been authorized for detection and/or diagnosis of  SARS-CoV-2 by FDA under an Emergency Use Authorization (EUA). This EUA will remain  in effect (meaning this test can be used) for the duration of the COVID-19 declaration under Section 564(b)(1) of the Act, 21 U.S.C.section 360bbb-3(b)(1), unless the authorization is terminated  or revoked sooner.       Influenza A by PCR NEGATIVE NEGATIVE Final   Influenza B by PCR NEGATIVE NEGATIVE Final    Comment: (NOTE) The Xpert Xpress SARS-CoV-2/FLU/RSV plus assay is intended as an aid in the diagnosis of influenza from Nasopharyngeal swab specimens and should not be used as a sole basis for treatment. Nasal washings and aspirates are unacceptable for Xpert Xpress SARS-CoV-2/FLU/RSV testing.  Fact Sheet for Patients: EntrepreneurPulse.com.au  Fact Sheet for Healthcare Providers: IncredibleEmployment.be  This test is not yet approved or cleared by the [place] FDA and has been authorized for detection and/or diagnosis of SARS-CoV-2 by FDA under an Emergency Use Authorization (EUA). This EUA will remain in effect (meaning this test can be used) for the duration of the COVID-19 declaration under Section 564(b)(1) of the Act, 21 U.S.C. section 360bbb-3(b)(1), unless the authorization is terminated or revoked.  Performed at [facility], [place] [address], [place], [place] [address]     RADIOLOGY STUDIES/RESULTS: DG Chest 2 View  Result Date: 07/30/2021 CLINICAL DATA:  Shortness of breath EXAM: CHEST - 2 VIEW COMPARISON:  Chest radiograph 12/20/2020 FINDINGS: The cardiomediastinal silhouette is within normal limits. There are diffusely increased interstitial markings throughout both lungs. Focal opacity projecting over the right upper lobe on the frontal projection is similar to prior studies and likely reflects scar. There is no other focal consolidation. There are small bilateral pleural effusions, new since 12/20/2020. There is no pneumothorax.  There is no acute osseous abnormality. IMPRESSION: Diffusely increased interstitial markings suspicious for mild pulmonary interstitial edema with small bilateral pleural effusions superimposed on chronic changes. Electronically Signed   By: Valetta Mole M.D.   On: 07/30/2021 16:04   CT Angio Chest PE W and/or Wo Contrast  Result Date: 07/30/2021 CLINICAL DATA:  PE suspected, high prob. pt's husband called to report pt having increased SHOB , back and abd EXAM: CT ANGIOGRAPHY CHEST WITH CONTRAST TECHNIQUE: Multidetector CT imaging of the chest was performed using the standard protocol during bolus administration of intravenous contrast. Multiplanar CT image reconstructions and MIPs were obtained to evaluate the vascular anatomy. CONTRAST:  63mL OMNIPAQUE IOHEXOL 350 MG/ML SOLN COMPARISON:  CT abdomen pelvis 11/24/2018 FINDINGS: Cardiovascular: Satisfactory opacification of the pulmonary arteries to the segmental level. No evidence of pulmonary embolism. Normal heart size. No significant pericardial effusion. The thoracic aorta is normal in caliber. Mild atherosclerotic plaque of the thoracic aorta. Three-vessel coronary artery calcifications. Mediastinum/Nodes: No enlarged mediastinal, hilar, or axillary  lymph nodes. Thyroid gland, trachea, and esophagus demonstrate no significant findings. Lungs/Pleura: Emphysematous changes. No focal consolidation. Stable 8 mm right upper lobe pulmonary nodule (7:48). Stable linear scarring (7:55). Similar-appearing 0.7 x 1.1 cm left upper lobe pulmonary nodule (7:43). Similar-appearing other pulmonary nodule within left upper lobe (7:48). No pulmonary mass. Interval development of bilateral trace pleural effusions. No pneumothorax. Upper Abdomen: No acute abnormality. Musculoskeletal: No chest wall abnormality. No suspicious lytic or blastic osseous lesions. No acute displaced fracture. Review of the MIP images confirms the above findings. IMPRESSION: 1. No pulmonary  embolus. 2. Interval development of bilateral trace pleural effusions. 3. Aortic Atherosclerosis (ICD10-I70.0) and Emphysema (ICD10-J43.9). Electronically Signed   By: Iven Finn M.D.   On: 07/30/2021 21:44   VAS [place] LOWER EXTREMITY VENOUS (DVT)  Result Date: 07/31/2021  Lower Venous DVT Study Patient Name:  Jasmine [name]  Date of Exam:   07/31/2021 Medical Rec #: [id number]        Accession #:    [id number] Date of Birth: [birth year]        Patient Gender: F Patient Age:   63 years Exam Location:  [facility] Procedure:      VAS [place] LOWER EXTREMITY VENOUS (DVT) Referring Phys: Oren Binet --------------------------------------------------------------------------------  Indications: Bilateral lower edema.  Anticoagulation: Lovenox. Comparison Study: No recent prior studies. Performing Technologist: Darlin Coco RDMS, RVT  Examination Guidelines: A complete evaluation includes B-mode imaging, spectral Doppler, color Doppler, and power Doppler as needed of all accessible portions of each vessel. Bilateral testing is considered an integral part of a complete examination. Limited examinations for reoccurring indications may be performed as noted. The reflux portion of the exam is performed with the patient in reverse Trendelenburg.  +---------+---------------+---------+-----------+----------+--------------+ RIGHT    CompressibilityPhasicitySpontaneityPropertiesThrombus Aging +---------+---------------+---------+-----------+----------+--------------+ CFV      Full           Yes      Yes                                 +---------+---------------+---------+-----------+----------+--------------+ SFJ      Full                                                        +---------+---------------+---------+-----------+----------+--------------+ FV Prox  Full                                                         +---------+---------------+---------+-----------+----------+--------------+ FV Mid   Full                                                        +---------+---------------+---------+-----------+----------+--------------+ FV DistalFull                                                        +---------+---------------+---------+-----------+----------+--------------+  PFV      Full                                                        +---------+---------------+---------+-----------+----------+--------------+ POP      Full           Yes      Yes                                 +---------+---------------+---------+-----------+----------+--------------+ Gastroc  Full                                                        +---------+---------------+---------+-----------+----------+--------------+   +---------+---------------+---------+-----------+----------+--------------+ LEFT     CompressibilityPhasicitySpontaneityPropertiesThrombus Aging +---------+---------------+---------+-----------+----------+--------------+ CFV      Full           Yes      Yes                                 +---------+---------------+---------+-----------+----------+--------------+ SFJ      Full                                                        +---------+---------------+---------+-----------+----------+--------------+ FV Prox  Full                                                        +---------+---------------+---------+-----------+----------+--------------+ FV Mid   Full                                                        +---------+---------------+---------+-----------+----------+--------------+ FV DistalFull                                                        +---------+---------------+---------+-----------+----------+--------------+ PFV      Full                                                         +---------+---------------+---------+-----------+----------+--------------+ POP      Full           Yes      Yes                                 +---------+---------------+---------+-----------+----------+--------------+  PTV      Full                                                        +---------+---------------+---------+-----------+----------+--------------+ PERO     Full                                                        +---------+---------------+---------+-----------+----------+--------------+ Gastroc  Full                                                        +---------+---------------+---------+-----------+----------+--------------+     Summary: RIGHT: - There is no evidence of deep vein thrombosis in the lower extremity.  - No cystic structure found in the popliteal fossa.  LEFT: - There is no evidence of deep vein thrombosis in the lower extremity.  - No cystic structure found in the popliteal fossa.  *See table(s) above for measurements and observations.    Preliminary      LOS: 0 days   Oren Binet, MD  [facility]    To contact the attending provider between 7A-7P or the covering provider during after hours 7P-7A, please log into the web site [email or web address] and access using universal [place] password for that web site. If you do not have the password, please call the hospital operator.  07/31/2021, 12:04 PM

## 2021-07-31 NOTE — Progress Notes (Signed)
PT Cancellation Note  Patient Details Name: Jasmine [name] MRN: [id number] DOB: 27-Feb-1946   Cancelled Treatment:    Reason Eval/Treat Not Completed: Other (comment) awaiting results of doppler for DVT r/o. Will follow thru day and return when appropriate and if time/schedule allow.   Windell Norfolk, DPT, PN2   Supplemental Physical Therapist [place]    Pager [phone] Acute Rehab Office [phone]

## 2021-08-01 ENCOUNTER — Other Ambulatory Visit (HOSPITAL_COMMUNITY): Payer: Self-pay

## 2021-08-01 DIAGNOSIS — E890 Postprocedural hypothyroidism: Secondary | ICD-10-CM | POA: Diagnosis not present

## 2021-08-01 DIAGNOSIS — J9621 Acute and chronic respiratory failure with hypoxia: Secondary | ICD-10-CM | POA: Diagnosis not present

## 2021-08-01 DIAGNOSIS — J441 Chronic obstructive pulmonary disease with (acute) exacerbation: Secondary | ICD-10-CM | POA: Diagnosis not present

## 2021-08-01 DIAGNOSIS — I5032 Chronic diastolic (congestive) heart failure: Secondary | ICD-10-CM | POA: Diagnosis not present

## 2021-08-01 LAB — BASIC METABOLIC PANEL
Anion gap: 7 (ref 5–15)
BUN: 20 mg/dL (ref 8–23)
CO2: 44 mmol/L — ABNORMAL HIGH (ref 22–32)
Calcium: 9.7 mg/dL (ref 8.9–10.3)
Chloride: 86 mmol/L — ABNORMAL LOW (ref 98–111)
Creatinine, Ser: 0.86 mg/dL (ref 0.44–1.00)
GFR, Estimated: >60 mL/min (ref >60)
Glucose, Bld: 117 mg/dL — ABNORMAL HIGH (ref 70–99)
Potassium: 4.1 mmol/L (ref 3.5–5.1)
Sodium: 137 mmol/L (ref 135–145)

## 2021-08-01 LAB — GLUCOSE, CAPILLARY
Glucose-Capillary: 101 mg/dL — ABNORMAL HIGH (ref 70–99)
Glucose-Capillary: 137 mg/dL — ABNORMAL HIGH (ref 70–99)

## 2021-08-01 LAB — MAGNESIUM: Magnesium: 2.1 mg/dL (ref 1.7–2.4)

## 2021-08-01 MED ORDER — FLUMAZENIL 0.5 MG/5ML IV SOLN
INTRAVENOUS | Status: AC
  Filled 2021-08-01: qty 5

## 2021-08-01 MED ORDER — ALBUTEROL SULFATE (2.5 MG/3ML) 0.083% IN NEBU
2.5000 mg | INHALATION_SOLUTION | Freq: Four times a day (QID) | RESPIRATORY_TRACT | 1 refills | Status: DC | PRN
  Filled 2021-08-01: qty 75, 7d supply, fill #0

## 2021-08-01 MED ORDER — PREDNISONE 5 MG PO TABS
ORAL_TABLET | ORAL | 1 refills | Status: DC
  Filled 2021-08-01: qty 60, 30d supply, fill #0

## 2021-08-01 MED ORDER — IPRATROPIUM-ALBUTEROL 0.5-2.5 (3) MG/3ML IN SOLN
3.0000 mL | Freq: Four times a day (QID) | RESPIRATORY_TRACT | 11 refills | Status: DC | PRN
  Filled 2021-08-01: qty 360, 30d supply, fill #0

## 2021-08-01 NOTE — TOC Progression Note (Addendum)
Transition of Care [facility]) - Progression Note    Patient Details  Name: Jasmine [name] MRN: [id number] Date of Birth: 1946/04/23  Transition of Care [facility]) CM/SW [place], RN Phone Number:[phone]  08/01/2021, 10:20 AM  Clinical Narrative:    [facility] consulted for patient with outpatient therapy needs CM at bedside to discuss outpatient therapy. Patient states that she does have transportation to outpatient therapy. Outpatient therapy has been set up. Patient states that she is from home alone where she generally functions independently. Patient admits that she has license but hasn't been driving recently due to not feeling safe to drive but she does have family that will make sure that she gets to her appointments. Patient has access to medications and is able to afford them. There are no oter needs at this time. TOC will continue to follow.  [place] has been ordered per [employer].   12 [employer] called CM to notify that patient has had 2 nebulizer machines within the past 3 years so she will not be eligible for another machine. Md has been made aware.     Expected Discharge Plan: OP Rehab Barriers to Discharge: Continued Medical Work up  Expected Discharge Plan and Services Expected Discharge Plan: OP Rehab In-house Referral: NA Discharge Planning Services: CM Consult   Living arrangements for the past 2 months: Single Family Home                 DME Arranged: N/A DME Agency: NA       HH Arranged:  (n/a)           Social Determinants of Health (SDOH) Interventions    Readmission Risk Interventions Readmission Risk Prevention Plan 03/02/2020 12/07/2019 08/17/2019  Transportation Screening Complete Complete Complete  PCP or Specialist Appt within 3-5 Days - Complete -  HRI or Home Care Consult Complete Complete -  Social Work Consult for [place] Planning/Counseling - Complete -  Palliative Care Screening Not Applicable Not Applicable -   Medication Review (RN Care Manager) Referral to Pharmacy Complete -  PCP or Specialist appointment within 3-5 days of discharge - - -  [place] or [place] recent data might be hidden

## 2021-08-01 NOTE — Care Management Obs Status (Signed)
[place] NOTIFICATION   Patient Details  Name: Jasmine [name] MRN: [id number] Date of Birth: 09-13-1946   Medicare Observation Status Notification Given:  Yes    Angelita Ingles, RN 08/01/2021, 9:23 AM

## 2021-08-01 NOTE — Evaluation (Signed)
Occupational Therapy Evaluation Patient Details Name: Jasmine [name] MRN: [id number] DOB: July 05, 1946 Today's Date: 08/01/2021   History of Present Illness 75yo female who presented on 10/11 with SOB, hypoxic down to 80% on 3-4LPM. PE negative. Found to be in COPD exacerbation. PMH lung CA, CHF, chronic bronchitis, COPD, hx DVT, HTN, lumbar radiculopathy, osteoporosis   Clinical Impression   Patient admitted for the diagnosis above.  She has been informed she is discharging today, and RN in at conclusion of eval to review discharge paperwork.  Bulk of the eval surrounded safety and potential modifications to her bathroom at home to minimize her fall risk.  She is interested in a walk in tub, but OT also discussed tub bench and hand held shower through the [place].  The patient is very versed about her options, and has a good plan to proceed once home.  She has no skilled OT needs in the acute or post acute setting.         Recommendations for follow up therapy are one component of a multi-disciplinary discharge planning process, led by the attending physician.  Recommendations may be updated based on patient status, additional functional criteria and insurance authorization.   Follow Up Recommendations  No OT follow up    Equipment Recommendations  None recommended by OT    Recommendations for Other Services       Precautions / Restrictions Precautions Precautions: None Precaution Comments: 3-4LPM at baseline Restrictions Weight Bearing Restrictions: No      Mobility Bed Mobility Overal bed mobility: Modified Independent               Patient Response: Cooperative  Transfers Overall transfer level: Modified independent Equipment used: Rolling walker (2 wheeled)                  Balance Overall balance assessment: Mild deficits observed, not formally tested         Standing balance support: Bilateral upper extremity supported                                ADL either performed or assessed with clinical judgement   ADL Overall ADL's : At baseline                                             Vision Patient Visual Report: No change from baseline       Perception     Praxis      Pertinent Vitals/Pain Pain Assessment: No/denies pain     Hand Dominance Right   Extremity/Trunk Assessment Upper Extremity Assessment Upper Extremity Assessment: Overall WFL for tasks assessed   Lower Extremity Assessment Lower Extremity Assessment: Defer to PT evaluation   Cervical / Trunk Assessment Cervical / Trunk Assessment: Kyphotic   Communication Communication Communication: No difficulties   Cognition Arousal/Alertness: Awake/alert Behavior During Therapy: WFL for tasks assessed/performed Overall Cognitive Status: Within Functional Limits for tasks assessed                                                      Home Living Family/patient expects to be discharged to:: Private residence Living Arrangements: Spouse/significant  other Available Help at Discharge: Family;Available 24 hours/day Type of Home: House Home Access: Ramped entrance     Home Layout: One level     Bathroom Shower/Tub: Walk-in shower;Tub/shower unit   Bathroom Toilet: Handicapped height     Home Equipment: Environmental consultant - 2 wheels;Walker - 4 wheels;Wheelchair - manual          Prior Functioning/Environment Level of Independence: Independent with assistive device(s)                 OT Problem List: Decreased activity tolerance      OT Treatment/Interventions:      OT Goals(Current goals can be found in the care plan section) Acute Rehab OT Goals Patient Stated Goal: Hoping to return home today, ? HH PT OT Goal Formulation: With patient Time For Goal Achievement: 08/01/21 Potential to Achieve Goals: Good  OT Frequency:     Barriers to D/C:  None noted          Co-evaluation               AM-PAC OT "6 Clicks" Daily Activity     Outcome Measure Help from another person eating meals?: None Help from another person taking care of personal grooming?: None Help from another person toileting, which includes using toliet, bedpan, or urinal?: None Help from another person bathing (including washing, rinsing, drying)?: None Help from another person to put on and taking off regular upper body clothing?: None Help from another person to put on and taking off regular lower body clothing?: None 6 Click Score: 24   End of Session Equipment Utilized During Treatment: Rolling walker  Activity Tolerance: Patient tolerated treatment well Patient left: in bed;with call bell/phone within reach;with nursing/sitter in room  OT Visit Diagnosis: Unsteadiness on feet (R26.81)                Time: 1133-1150 OT Time Calculation (min): 17 min Charges:  OT General Charges $OT Visit: 1 Visit OT Evaluation $OT Eval Moderate Complexity: 1 Mod  08/01/2021  RP, OTR/L  Acute Rehabilitation Services  Office:  [phone]   Metta Clines 08/01/2021, 12:14 PM

## 2021-08-01 NOTE — Discharge Summary (Addendum)
PATIENT DETAILS Name: Jasmine [name] Age: 75 y.o. Sex: female Date of Birth: [birth year] MRN: [id number]. Admitting Physician: Marcelyn Bruins, MD [id number], Hunt Oris, MD  Admit Date: 07/30/2021 Discharge date: 08/01/2021  Recommendations for Outpatient Follow-up:  Follow up with PCP in 1-2 weeks Please obtain CMP/CBC in one week Has lung nodules-continue outpatient surveillance per PCP.  Admitted From:  Home  Disposition: [place]: No  Equipment/Devices: None  Discharge Condition: Stable  CODE STATUS: FULL CODE  Diet recommendation:  Diet Order             Diet - low sodium heart healthy           Diet Carb Modified           Diet heart healthy/carb modified Room service appropriate? Yes; Fluid consistency: Thin  Diet effective now                    Brief Summary: Patient is a 75 y.o. female with history of COPD-on home O2 3-4 L, prior history of DVT, HTN, HLD, hypothyroidism who presented to the hospital with acute respiratory distress-found to have COPD exacerbation and subsequently admitted to the hospitalist service.    Brief Hospital Course: COPD exacerbation: Significantly better-hardly any wheezing-she is back to her baseline-continue tapering steroids till she is back to her usual 5 mg of prednisone.  Continue her usual inhaler regimen.  At her request-we will discharge her on as needed nebulized bronchodilators.    HFpEF with exacerbation: Significant lower extremity edema-acknowledges noncompliance to fluid restriction (claims drinks a lot of water).  Treated with IV Lasix-with significant improvement in edema overnight-counseled regarding importance of fluid restriction-suspect stable to be discharged on her usual diuretic regimen.  Echo showed preserved EF-no signs of cor pulmonale.   Bilateral lower extremity swelling: See above regarding diuretics-has history of DVT-hence lower extremity Dopplers were done that were negative.     Recent UA in July without significant proteinuria-LFTs/albumin within normal limits on admission.  Treated with IV Lasix with significant improvement-fluid restriction counseled-continue oral Lasix on discharge   Chronic hypoxic respiratory failure: Back on 3-4 L of oxygen this morning.   Hypomagnesemia: Repleted.   Hypothyroidism: Continue Synthroid-recent TSH was normal.   HLD: Continue statin   DM-2 (A1c 6.3 on 7/1): Diet controlled at home-management SSI in the inpatient setting.  RUL nodule/LUL nodule: Chronic issue per radiology-stable for monitoring in the outpatient setting.    Procedures None  Discharge Diagnoses:  Principal Problem:   Postablative hypothyroidism Active Problems:   Depression with anxiety   Essential hypertension   Asthma   Chronic respiratory failure with hypoxia (HCC)   On home oxygen therapy   Chronic diastolic congestive heart failure (HCC)   Bilateral hearing loss   Diabetes mellitus due to underlying condition without complication, without long-term current use of insulin (HCC)   Steroid-dependent chronic obstructive pulmonary disease (HCC)   COPD exacerbation (HCC)   Discharge Instructions:  Activity:  As tolerated with Full fall precautions use walker/cane & assistance as needed  Discharge Instructions     Ambulatory referral to Physical Therapy   Complete by: As directed    Call MD for:  difficulty breathing, headache or visual disturbances   Complete by: As directed    Diet - low sodium heart healthy   Complete by: As directed    Diet Carb Modified   Complete by: As directed    Discharge instructions   Complete  by: As directed    Follow with Primary MD  Biagio Borg, MD in 1-2 weeks  Please get a complete blood count and chemistry panel checked by your Primary MD at your next visit, and again as instructed by your Primary MD.  Get Medicines reviewed and adjusted: Please take all your medications with you for your next visit  with your Primary MD  Laboratory/radiological data: Please request your Primary MD to go over all hospital tests and procedure/radiological results at the follow up, please ask your Primary MD to get all Hospital records sent to his/her office.  In some cases, they will be blood work, cultures and biopsy results pending at the time of your discharge. Please request that your primary care M.D. follows up on these results.  Also Note the following: If you experience worsening of your admission symptoms, develop shortness of breath, life threatening emergency, suicidal or homicidal thoughts you must seek medical attention immediately by calling 911 or calling your MD immediately  if symptoms less severe.  You must read complete instructions/literature along with all the possible adverse reactions/side effects for all the Medicines you take and that have been prescribed to you. Take any new Medicines after you have completely understood and accpet all the possible adverse reactions/side effects.   Do not drive when taking Pain medications or sleeping medications (Benzodaizepines)  Do not take more than prescribed Pain, Sleep and Anxiety Medications. It is not advisable to combine anxiety,sleep and pain medications without talking with your primary care practitioner  Special Instructions: If you have smoked or chewed Tobacco  in the last 2 yrs please stop smoking, stop any regular Alcohol  and or any Recreational drug use.  Wear Seat belts while driving.  Please note: You were cared for by a hospitalist during your hospital stay. Once you are discharged, your primary care physician will handle any further medical issues. Please note that NO REFILLS for any discharge medications will be authorized once you are discharged, as it is imperative that you return to your primary care physician (or establish a relationship with a primary care physician if you do not have one) for your post hospital discharge  needs so that they can reassess your need for medications and monitor your lab values.   Please maintain a fluid restriction of 1500 cc a day.   For home use only DME Nebulizer machine   Complete by: As directed    Patient needs a nebulizer to treat with the following condition: COPD (chronic obstructive pulmonary disease) ([place])   Length of Need: Lifetime   Increase activity slowly   Complete by: As directed       Allergies as of 08/01/2021       Reactions   Alendronate Sodium Other (See Comments)   Patient does not remember this reaction        Medication List     TAKE these medications    albuterol 108 (90 Base) MCG/ACT inhaler Commonly known as: VENTOLIN HFA INHALE 1 TO 2 PUFFS INTO THE LUNGS EVERY 6 HOURS AS NEEDED FOR WHEEZING OR SHORTNESS OF BREATH What changed:  See the new instructions. Another medication with the same name was removed. Continue taking this medication, and follow the directions you see here.   aspirin 81 MG EC tablet Take 1 tablet (81 mg total) by mouth daily.   atorvastatin 10 MG tablet Commonly known as: LIPITOR TAKE 1 TABLET BY MOUTH DAILY. What changed:  how much to take  how to take this when to take this additional instructions   citalopram 20 MG tablet Commonly known as: CELEXA TAKE ONE TABLET BY MOUTH DAILY What changed:  how much to take how to take this when to take this   furosemide 40 MG tablet Commonly known as: LASIX 1 tab by mouth twice per day What changed:  how much to take how to take this when to take this additional instructions   ipratropium-albuterol 0.5-2.5 (3) MG/3ML Soln Commonly known as: DUONEB Take 3 mLs by nebulization every 6 (six) hours as needed.   levothyroxine 75 MCG tablet Commonly known as: SYNTHROID TAKE ONE TABLET BY MOUTH EARLY MORNING What changed:  how much to take how to take this when to take this additional instructions   OXYGEN Inhale 3 L into the lungs continuous. 24/7    potassium chloride 10 MEQ tablet Commonly known as: KLOR-CON TAKE 1 TABLET BY MOUTH 2 TIMES DAILY. What changed:  how much to take how to take this when to take this additional instructions   predniSONE 5 MG tablet Commonly known as: DELTASONE Take 40 mg p.o. daily for 2 days, 30 mg p.o. daily for 2 days, 20 mg p.o. daily for 2 days, 10 mg p.o. daily for 2 days, and then resume usual dosing of 5 mg p.o. daily. What changed:  how much to take how to take this when to take this additional instructions   Trelegy Ellipta 200-62.5-25 MCG/INH Aepb Generic drug: Fluticasone-Umeclidin-Vilant Inhale 1 puff into the lungs daily.               Durable Medical Equipment  (From admission, onward)           Start     Ordered   08/01/21 0000  For home use only DME Nebulizer machine       Question Answer Comment  Patient needs a nebulizer to treat with the following condition COPD (chronic obstructive pulmonary disease) ([place])   Length of Need Lifetime      08/01/21 1109            Follow-up Information     Biagio Borg, MD. Schedule an appointment as soon as possible for a visit in 1 week(s).   Specialties: Internal Medicine, Radiology Contact information: [place] [address] [phone]                Allergies  Allergen Reactions   Alendronate Sodium Other (See Comments)    Patient does not remember this reaction      Consultations:  None   Other Procedures/Studies: DG Chest 2 View  Result Date: 07/30/2021 CLINICAL DATA:  Shortness of breath EXAM: CHEST - 2 VIEW COMPARISON:  Chest radiograph 12/20/2020 FINDINGS: The cardiomediastinal silhouette is within normal limits. There are diffusely increased interstitial markings throughout both lungs. Focal opacity projecting over the right upper lobe on the frontal projection is similar to prior studies and likely reflects scar. There is no other focal consolidation. There are  small bilateral pleural effusions, new since 12/20/2020. There is no pneumothorax. There is no acute osseous abnormality. IMPRESSION: Diffusely increased interstitial markings suspicious for mild pulmonary interstitial edema with small bilateral pleural effusions superimposed on chronic changes. Electronically Signed   By: Valetta Mole M.D.   On: 07/30/2021 16:04   CT Angio Chest PE W and/or Wo Contrast  Result Date: 07/30/2021 CLINICAL DATA:  PE suspected, high prob. pt's husband called to report pt having increased SHOB ,  back and abd EXAM: CT ANGIOGRAPHY CHEST WITH CONTRAST TECHNIQUE: Multidetector CT imaging of the chest was performed using the standard protocol during bolus administration of intravenous contrast. Multiplanar CT image reconstructions and MIPs were obtained to evaluate the vascular anatomy. CONTRAST:  24mL OMNIPAQUE IOHEXOL 350 MG/ML SOLN COMPARISON:  CT abdomen pelvis 11/24/2018 FINDINGS: Cardiovascular: Satisfactory opacification of the pulmonary arteries to the segmental level. No evidence of pulmonary embolism. Normal heart size. No significant pericardial effusion. The thoracic aorta is normal in caliber. Mild atherosclerotic plaque of the thoracic aorta. Three-vessel coronary artery calcifications. Mediastinum/Nodes: No enlarged mediastinal, hilar, or axillary lymph nodes. Thyroid gland, trachea, and esophagus demonstrate no significant findings. Lungs/Pleura: Emphysematous changes. No focal consolidation. Stable 8 mm right upper lobe pulmonary nodule (7:48). Stable linear scarring (7:55). Similar-appearing 0.7 x 1.1 cm left upper lobe pulmonary nodule (7:43). Similar-appearing other pulmonary nodule within left upper lobe (7:48). No pulmonary mass. Interval development of bilateral trace pleural effusions. No pneumothorax. Upper Abdomen: No acute abnormality. Musculoskeletal: No chest wall abnormality. No suspicious lytic or blastic osseous lesions. No acute displaced fracture. Review  of the MIP images confirms the above findings. IMPRESSION: 1. No pulmonary embolus. 2. Interval development of bilateral trace pleural effusions. 3. Aortic Atherosclerosis (ICD10-I70.0) and Emphysema (ICD10-J43.9). Electronically Signed   By: Iven Finn M.D.   On: 07/30/2021 21:44   ECHOCARDIOGRAM COMPLETE  Result Date: 07/31/2021    ECHOCARDIOGRAM REPORT   Patient Name:   Jasmine [name] Date of Exam: 07/31/2021 Medical Rec #:  [id number]       Height:       60.0 in Accession #:    [id number]      Weight:       130.0 lb Date of Birth:  1945/12/03       BSA:          1.554 m Patient Age:    45 years        BP:           123/62 mmHg Patient Gender: F               HR:           89 bpm. Exam Location:  Inpatient Procedure: 2D Echo, 3D Echo, Cardiac Doppler and Color Doppler Indications:    R06.02 SOB  History:        Patient has prior history of Echocardiogram examinations, most                 recent 12/17/2018. CHF, COPD, Signs/Symptoms:Shortness of Breath,                 Bacteremia and Dyspnea; Risk Factors:Hypertension. Cor                 pulmonale.  Sonographer:    Roseanna Rainbow RDCS Referring Phys: [place]  Sonographer Comments: Technically difficult study due to poor echo windows. IMPRESSIONS  1. Left ventricular ejection fraction, by estimation, is 60 to 65%. The left ventricle has normal function. The left ventricle has no regional wall motion abnormalities. There is moderate left ventricular hypertrophy. Left ventricular diastolic parameters were normal.  2. Right ventricular systolic function is normal. The right ventricular size is normal.  3. The mitral valve is normal in structure. No evidence of mitral valve regurgitation. No evidence of mitral stenosis.  4. The aortic valve is tricuspid. Aortic valve regurgitation is mild. Mild aortic valve sclerosis is present, with no evidence of aortic valve stenosis.  5. The inferior vena cava is normal in size with greater than 50% respiratory  variability, suggesting right atrial pressure of 3 mmHg. FINDINGS  Left Ventricle: Left ventricular ejection fraction, by estimation, is 60 to 65%. The left ventricle has normal function. The left ventricle has no regional wall motion abnormalities. The left ventricular internal cavity size was normal in size. There is  moderate left ventricular hypertrophy. Left ventricular diastolic parameters were normal. Right Ventricle: The right ventricular size is normal. No increase in right ventricular wall thickness. Right ventricular systolic function is normal. Left Atrium: Left atrial size was normal in size. Right Atrium: Right atrial size was normal in size. Pericardium: There is no evidence of pericardial effusion. Mitral Valve: The mitral valve is normal in structure. No evidence of mitral valve regurgitation. No evidence of mitral valve stenosis. Tricuspid Valve: The tricuspid valve is normal in structure. Tricuspid valve regurgitation is mild . No evidence of tricuspid stenosis. Aortic Valve: The aortic valve is tricuspid. Aortic valve regurgitation is mild. Mild aortic valve sclerosis is present, with no evidence of aortic valve stenosis. Pulmonic Valve: The pulmonic valve was normal in structure. Pulmonic valve regurgitation is not visualized. No evidence of pulmonic stenosis. Aorta: The aortic root is normal in size and structure. Venous: The inferior vena cava is normal in size with greater than 50% respiratory variability, suggesting right atrial pressure of 3 mmHg. IAS/Shunts: No atrial level shunt detected by color flow Doppler.  LEFT VENTRICLE PLAX 2D LVIDd:         2.90 cm     Diastology LVIDs:         1.60 cm     LV e' medial:    8.70 cm/s LV PW:         1.00 cm     LV E/e' medial:  10.7 LV IVS:        1.40 cm     LV e' lateral:   9.68 cm/s LVOT diam:     1.60 cm     LV E/e' lateral: 9.6 LV SV:         54 LV SV Index:   35 LVOT Area:     2.01 cm                             3D Volume EF: LV Volumes (MOD)            3D EF:        73 % LV vol d, MOD A2C: 44.6 ml LV EDV:       93 ml LV vol d, MOD A4C: 50.0 ml LV ESV:       25 ml LV vol s, MOD A2C: 9.1 ml  LV SV:        68 ml LV vol s, MOD A4C: 11.2 ml LV SV MOD A2C:     35.5 ml LV SV MOD A4C:     50.0 ml LV SV MOD BP:      37.5 ml RIGHT VENTRICLE             IVC RV S prime:     13.40 cm/s  IVC diam: 1.40 cm TAPSE (M-mode): 1.9 cm LEFT ATRIUM             Index        RIGHT ATRIUM           Index LA diam:  2.10 cm 1.35 cm/m   RA Area:     10.20 cm LA Vol (A2C):   20.0 ml 12.87 ml/m  RA Volume:   22.20 ml  14.28 ml/m LA Vol (A4C):   15.0 ml 9.65 ml/m LA Biplane Vol: 18.0 ml 11.58 ml/m  AORTIC VALVE LVOT Vmax:   153.00 cm/s LVOT Vmean:  101.000 cm/s LVOT VTI:    0.269 m  AORTA Ao Root diam: 2.70 cm Ao Asc diam:  3.20 cm MITRAL VALVE MV Area (PHT): 3.42 cm    SHUNTS MV Decel Time: 222 msec    Systemic VTI:  0.27 m MV E velocity: 93.40 cm/s  Systemic Diam: 1.60 cm MV A velocity: 88.80 cm/s MV E/A ratio:  1.05 Jenkins Rouge MD Electronically signed by Jenkins Rouge MD Signature Date/Time: 07/31/2021/3:40:28 PM    Final    VAS [place] LOWER EXTREMITY VENOUS (DVT)  Result Date: 07/31/2021  Lower Venous DVT Study Patient Name:  Jasmine [name]  Date of Exam:   07/31/2021 Medical Rec #: [id number]        Accession #:    [id number] Date of Birth: [birth year]        Patient Gender: F Patient Age:   82 years Exam Location:  [facility] Procedure:      VAS [place] LOWER EXTREMITY VENOUS (DVT) Referring Phys: Oren Binet --------------------------------------------------------------------------------  Indications: Bilateral lower edema.  Anticoagulation: Lovenox. Comparison Study: No recent prior studies. Performing Technologist: Darlin Coco RDMS, RVT  Examination Guidelines: A complete evaluation includes B-mode imaging, spectral Doppler, color Doppler, and power Doppler as needed of all accessible portions of each vessel. Bilateral testing is considered an integral  part of a complete examination. Limited examinations for reoccurring indications may be performed as noted. The reflux portion of the exam is performed with the patient in reverse Trendelenburg.  +---------+---------------+---------+-----------+----------+--------------+ RIGHT    CompressibilityPhasicitySpontaneityPropertiesThrombus Aging +---------+---------------+---------+-----------+----------+--------------+ CFV      Full           Yes      Yes                                 +---------+---------------+---------+-----------+----------+--------------+ SFJ      Full                                                        +---------+---------------+---------+-----------+----------+--------------+ FV Prox  Full                                                        +---------+---------------+---------+-----------+----------+--------------+ FV Mid   Full                                                        +---------+---------------+---------+-----------+----------+--------------+ FV DistalFull                                                        +---------+---------------+---------+-----------+----------+--------------+  PFV      Full                                                        +---------+---------------+---------+-----------+----------+--------------+ POP      Full           Yes      Yes                                 +---------+---------------+---------+-----------+----------+--------------+ Gastroc  Full                                                        +---------+---------------+---------+-----------+----------+--------------+   +---------+---------------+---------+-----------+----------+--------------+ LEFT     CompressibilityPhasicitySpontaneityPropertiesThrombus Aging +---------+---------------+---------+-----------+----------+--------------+ CFV      Full           Yes      Yes                                  +---------+---------------+---------+-----------+----------+--------------+ SFJ      Full                                                        +---------+---------------+---------+-----------+----------+--------------+ FV Prox  Full                                                        +---------+---------------+---------+-----------+----------+--------------+ FV Mid   Full                                                        +---------+---------------+---------+-----------+----------+--------------+ FV DistalFull                                                        +---------+---------------+---------+-----------+----------+--------------+ PFV      Full                                                        +---------+---------------+---------+-----------+----------+--------------+ POP      Full           Yes      Yes                                 +---------+---------------+---------+-----------+----------+--------------+  PTV      Full                                                        +---------+---------------+---------+-----------+----------+--------------+ PERO     Full                                                        +---------+---------------+---------+-----------+----------+--------------+ Gastroc  Full                                                        +---------+---------------+---------+-----------+----------+--------------+     Summary: RIGHT: - There is no evidence of deep vein thrombosis in the lower extremity.  - No cystic structure found in the popliteal fossa.  LEFT: - There is no evidence of deep vein thrombosis in the lower extremity.  - No cystic structure found in the popliteal fossa.  *See table(s) above for measurements and observations. Electronically signed by Harold Barban MD on 07/31/2021 at 9:10:29 PM.    Final      TODAY-DAY OF DISCHARGE:  Subjective:   Jasmine [name] today has no headache,no  chest abdominal pain,no new weakness tingling or numbness, feels much better wants to go home today.   Objective:   Blood pressure 122/71, pulse 83, temperature 98.1 F (36.7 C), temperature source Oral, resp. rate 16, height 5' (1.524 m), weight 56.6 kg, SpO2 98 %.  Intake/Output Summary (Last 24 hours) at 08/01/2021 1130 Last data filed at 08/01/2021 1054 Gross per 24 hour  Intake 348.15 ml  Output 976 ml  Net -627.85 ml   Filed Weights   07/31/21 1359 08/01/21 0500  Weight: 59 kg 56.6 kg    Exam: Awake Alert, Oriented *3, No new F.N deficits, Normal affect [place].AT,PERRAL Supple Neck,No JVD, No cervical lymphadenopathy appriciated.  Symmetrical Chest wall movement, Good air movement bilaterally, CTAB RRR,No Gallops,Rubs or new Murmurs, No Parasternal Heave +ve B.Sounds, Abd Soft, Non tender, No organomegaly appriciated, No rebound -guarding or rigidity. No Cyanosis, Clubbing or edema, No new Rash or bruise   PERTINENT RADIOLOGIC STUDIES: DG Chest 2 View  Result Date: 07/30/2021 CLINICAL DATA:  Shortness of breath EXAM: CHEST - 2 VIEW COMPARISON:  Chest radiograph 12/20/2020 FINDINGS: The cardiomediastinal silhouette is within normal limits. There are diffusely increased interstitial markings throughout both lungs. Focal opacity projecting over the right upper lobe on the frontal projection is similar to prior studies and likely reflects scar. There is no other focal consolidation. There are small bilateral pleural effusions, new since 12/20/2020. There is no pneumothorax. There is no acute osseous abnormality. IMPRESSION: Diffusely increased interstitial markings suspicious for mild pulmonary interstitial edema with small bilateral pleural effusions superimposed on chronic changes. Electronically Signed   By: Valetta Mole M.D.   On: 07/30/2021 16:04   CT Angio Chest PE W and/or Wo Contrast  Result Date: 07/30/2021 CLINICAL DATA:  PE suspected, high prob. pt's husband called to  report pt having increased SHOB , back and abd EXAM:  CT ANGIOGRAPHY CHEST WITH CONTRAST TECHNIQUE: Multidetector CT imaging of the chest was performed using the standard protocol during bolus administration of intravenous contrast. Multiplanar CT image reconstructions and MIPs were obtained to evaluate the vascular anatomy. CONTRAST:  65mL OMNIPAQUE IOHEXOL 350 MG/ML SOLN COMPARISON:  CT abdomen pelvis 11/24/2018 FINDINGS: Cardiovascular: Satisfactory opacification of the pulmonary arteries to the segmental level. No evidence of pulmonary embolism. Normal heart size. No significant pericardial effusion. The thoracic aorta is normal in caliber. Mild atherosclerotic plaque of the thoracic aorta. Three-vessel coronary artery calcifications. Mediastinum/Nodes: No enlarged mediastinal, hilar, or axillary lymph nodes. Thyroid gland, trachea, and esophagus demonstrate no significant findings. Lungs/Pleura: Emphysematous changes. No focal consolidation. Stable 8 mm right upper lobe pulmonary nodule (7:48). Stable linear scarring (7:55). Similar-appearing 0.7 x 1.1 cm left upper lobe pulmonary nodule (7:43). Similar-appearing other pulmonary nodule within left upper lobe (7:48). No pulmonary mass. Interval development of bilateral trace pleural effusions. No pneumothorax. Upper Abdomen: No acute abnormality. Musculoskeletal: No chest wall abnormality. No suspicious lytic or blastic osseous lesions. No acute displaced fracture. Review of the MIP images confirms the above findings. IMPRESSION: 1. No pulmonary embolus. 2. Interval development of bilateral trace pleural effusions. 3. Aortic Atherosclerosis (ICD10-I70.0) and Emphysema (ICD10-J43.9). Electronically Signed   By: Iven Finn M.D.   On: 07/30/2021 21:44   ECHOCARDIOGRAM COMPLETE  Result Date: 07/31/2021    ECHOCARDIOGRAM REPORT   Patient Name:   Jasmine [name] Date of Exam: 07/31/2021 Medical Rec #:  [id number]       Height:       60.0 in Accession #:     [id number]      Weight:       130.0 lb Date of Birth:  1946-03-25       BSA:          1.554 m Patient Age:    58 years        BP:           123/62 mmHg Patient Gender: F               HR:           89 bpm. Exam Location:  Inpatient Procedure: 2D Echo, 3D Echo, Cardiac Doppler and Color Doppler Indications:    R06.02 SOB  History:        Patient has prior history of Echocardiogram examinations, most                 recent 12/17/2018. CHF, COPD, Signs/Symptoms:Shortness of Breath,                 Bacteremia and Dyspnea; Risk Factors:Hypertension. Cor                 pulmonale.  Sonographer:    Roseanna Rainbow RDCS Referring Phys: [place]  Sonographer Comments: Technically difficult study due to poor echo windows. IMPRESSIONS  1. Left ventricular ejection fraction, by estimation, is 60 to 65%. The left ventricle has normal function. The left ventricle has no regional wall motion abnormalities. There is moderate left ventricular hypertrophy. Left ventricular diastolic parameters were normal.  2. Right ventricular systolic function is normal. The right ventricular size is normal.  3. The mitral valve is normal in structure. No evidence of mitral valve regurgitation. No evidence of mitral stenosis.  4. The aortic valve is tricuspid. Aortic valve regurgitation is mild. Mild aortic valve sclerosis is present, with no evidence of aortic valve stenosis.  5. The inferior  vena cava is normal in size with greater than 50% respiratory variability, suggesting right atrial pressure of 3 mmHg. FINDINGS  Left Ventricle: Left ventricular ejection fraction, by estimation, is 60 to 65%. The left ventricle has normal function. The left ventricle has no regional wall motion abnormalities. The left ventricular internal cavity size was normal in size. There is  moderate left ventricular hypertrophy. Left ventricular diastolic parameters were normal. Right Ventricle: The right ventricular size is normal. No increase in right  ventricular wall thickness. Right ventricular systolic function is normal. Left Atrium: Left atrial size was normal in size. Right Atrium: Right atrial size was normal in size. Pericardium: There is no evidence of pericardial effusion. Mitral Valve: The mitral valve is normal in structure. No evidence of mitral valve regurgitation. No evidence of mitral valve stenosis. Tricuspid Valve: The tricuspid valve is normal in structure. Tricuspid valve regurgitation is mild . No evidence of tricuspid stenosis. Aortic Valve: The aortic valve is tricuspid. Aortic valve regurgitation is mild. Mild aortic valve sclerosis is present, with no evidence of aortic valve stenosis. Pulmonic Valve: The pulmonic valve was normal in structure. Pulmonic valve regurgitation is not visualized. No evidence of pulmonic stenosis. Aorta: The aortic root is normal in size and structure. Venous: The inferior vena cava is normal in size with greater than 50% respiratory variability, suggesting right atrial pressure of 3 mmHg. IAS/Shunts: No atrial level shunt detected by color flow Doppler.  LEFT VENTRICLE PLAX 2D LVIDd:         2.90 cm     Diastology LVIDs:         1.60 cm     LV e' medial:    8.70 cm/s LV PW:         1.00 cm     LV E/e' medial:  10.7 LV IVS:        1.40 cm     LV e' lateral:   9.68 cm/s LVOT diam:     1.60 cm     LV E/e' lateral: 9.6 LV SV:         54 LV SV Index:   35 LVOT Area:     2.01 cm                             3D Volume EF: LV Volumes (MOD)           3D EF:        73 % LV vol d, MOD A2C: 44.6 ml LV EDV:       93 ml LV vol d, MOD A4C: 50.0 ml LV ESV:       25 ml LV vol s, MOD A2C: 9.1 ml  LV SV:        68 ml LV vol s, MOD A4C: 11.2 ml LV SV MOD A2C:     35.5 ml LV SV MOD A4C:     50.0 ml LV SV MOD BP:      37.5 ml RIGHT VENTRICLE             IVC RV S prime:     13.40 cm/s  IVC diam: 1.40 cm TAPSE (M-mode): 1.9 cm LEFT ATRIUM             Index        RIGHT ATRIUM           Index LA diam:        2.10 cm  1.35 cm/m   RA  Area:     10.20 cm LA Vol (A2C):   20.0 ml 12.87 ml/m  RA Volume:   22.20 ml  14.28 ml/m LA Vol (A4C):   15.0 ml 9.65 ml/m LA Biplane Vol: 18.0 ml 11.58 ml/m  AORTIC VALVE LVOT Vmax:   153.00 cm/s LVOT Vmean:  101.000 cm/s LVOT VTI:    0.269 m  AORTA Ao Root diam: 2.70 cm Ao Asc diam:  3.20 cm MITRAL VALVE MV Area (PHT): 3.42 cm    SHUNTS MV Decel Time: 222 msec    Systemic VTI:  0.27 m MV E velocity: 93.40 cm/s  Systemic Diam: 1.60 cm MV A velocity: 88.80 cm/s MV E/A ratio:  1.05 Jenkins Rouge MD Electronically signed by Jenkins Rouge MD Signature Date/Time: 07/31/2021/3:40:28 PM    Final    VAS [place] LOWER EXTREMITY VENOUS (DVT)  Result Date: 07/31/2021  Lower Venous DVT Study Patient Name:  Jasmine [name]  Date of Exam:   07/31/2021 Medical Rec #: [id number]        Accession #:    [id number] Date of Birth: 12-Jun-1946        Patient Gender: F Patient Age:   37 years Exam Location:  [facility] Procedure:      VAS [place] LOWER EXTREMITY VENOUS (DVT) Referring Phys: Oren Binet --------------------------------------------------------------------------------  Indications: Bilateral lower edema.  Anticoagulation: Lovenox. Comparison Study: No recent prior studies. Performing Technologist: Darlin Coco RDMS, RVT  Examination Guidelines: A complete evaluation includes B-mode imaging, spectral Doppler, color Doppler, and power Doppler as needed of all accessible portions of each vessel. Bilateral testing is considered an integral part of a complete examination. Limited examinations for reoccurring indications may be performed as noted. The reflux portion of the exam is performed with the patient in reverse Trendelenburg.  +---------+---------------+---------+-----------+----------+--------------+ RIGHT    CompressibilityPhasicitySpontaneityPropertiesThrombus Aging +---------+---------------+---------+-----------+----------+--------------+ CFV      Full           Yes      Yes                                  +---------+---------------+---------+-----------+----------+--------------+ SFJ      Full                                                        +---------+---------------+---------+-----------+----------+--------------+ FV Prox  Full                                                        +---------+---------------+---------+-----------+----------+--------------+ FV Mid   Full                                                        +---------+---------------+---------+-----------+----------+--------------+ FV DistalFull                                                        +---------+---------------+---------+-----------+----------+--------------+  PFV      Full                                                        +---------+---------------+---------+-----------+----------+--------------+ POP      Full           Yes      Yes                                 +---------+---------------+---------+-----------+----------+--------------+ Gastroc  Full                                                        +---------+---------------+---------+-----------+----------+--------------+   +---------+---------------+---------+-----------+----------+--------------+ LEFT     CompressibilityPhasicitySpontaneityPropertiesThrombus Aging +---------+---------------+---------+-----------+----------+--------------+ CFV      Full           Yes      Yes                                 +---------+---------------+---------+-----------+----------+--------------+ SFJ      Full                                                        +---------+---------------+---------+-----------+----------+--------------+ FV Prox  Full                                                        +---------+---------------+---------+-----------+----------+--------------+ FV Mid   Full                                                         +---------+---------------+---------+-----------+----------+--------------+ FV DistalFull                                                        +---------+---------------+---------+-----------+----------+--------------+ PFV      Full                                                        +---------+---------------+---------+-----------+----------+--------------+ POP      Full           Yes      Yes                                 +---------+---------------+---------+-----------+----------+--------------+  PTV      Full                                                        +---------+---------------+---------+-----------+----------+--------------+ PERO     Full                                                        +---------+---------------+---------+-----------+----------+--------------+ Gastroc  Full                                                        +---------+---------------+---------+-----------+----------+--------------+     Summary: RIGHT: - There is no evidence of deep vein thrombosis in the lower extremity.  - No cystic structure found in the popliteal fossa.  LEFT: - There is no evidence of deep vein thrombosis in the lower extremity.  - No cystic structure found in the popliteal fossa.  *See table(s) above for measurements and observations. Electronically signed by Harold Barban MD on 07/31/2021 at 9:10:29 PM.    Final      PERTINENT LAB RESULTS: CBC: Recent Labs    07/30/21 1520 07/31/21 0459  WBC 7.3 7.7  HGB 13.7 11.6*  HCT 45.9 39.5  PLT 135* 138*   CMET CMP     Component Value Date/Time   NA 137 08/01/2021 0302   NA 142 08/06/2016 1405   K 4.1 08/01/2021 0302   K 3.6 08/06/2016 1405   CL 86 (L) 08/01/2021 0302   CO2 44 (H) 08/01/2021 0302   CO2 35 (H) 08/06/2016 1405   GLUCOSE 117 (H) 08/01/2021 0302   GLUCOSE 93 08/06/2016 1405   BUN 20 08/01/2021 0302   BUN 18.6 08/06/2016 1405   CREATININE 0.86 08/01/2021 0302    CREATININE 1.24 (H) 06/08/2020 1555   CREATININE 0.8 08/06/2016 1405   CALCIUM 9.7 08/01/2021 0302   CALCIUM 10.1 08/06/2016 1405   PROT 7.1 07/30/2021 1520   PROT 8.8 (H) 08/06/2016 1405   ALBUMIN 3.8 07/30/2021 1520   ALBUMIN 3.7 08/06/2016 1405   AST 34 07/30/2021 1520   AST 27 08/06/2016 1405   ALT 22 07/30/2021 1520   ALT 16 08/06/2016 1405   ALKPHOS 52 07/30/2021 1520   ALKPHOS 77 08/06/2016 1405   BILITOT 1.0 07/30/2021 1520   BILITOT 0.61 08/06/2016 1405   GFRNONAA >60 08/01/2021 0302   GFRNONAA 42 (L) 06/08/2020 1555   GFRAA 49 (L) 06/08/2020 1555    GFR Estimated Creatinine Clearance: 43.8 mL/min (by C-G formula based on SCr of 0.86 mg/dL). No results for input(s): LIPASE, AMYLASE in the last 72 hours. No results for input(s): CKTOTAL, CKMB, CKMBINDEX, TROPONINI in the last 72 hours. Invalid input(s): POCBNP No results for input(s): DDIMER in the last 72 hours. No results for input(s): HGBA1C in the last 72 hours. No results for input(s): CHOL, HDL, LDLCALC, TRIG, CHOLHDL, LDLDIRECT in the last 72 hours. No results for input(s): TSH, T4TOTAL, T3FREE, THYROIDAB in the last 72 hours.  Invalid input(s): FREET3 No results  for input(s): VITAMINB12, FOLATE, FERRITIN, TIBC, IRON, RETICCTPCT in the last 72 hours. Coags: No results for input(s): INR in the last 72 hours.  Invalid input(s): PT Microbiology: Recent Results (from the past 240 hour(s))  Resp Panel by RT-PCR (Flu A&B, Covid) Nasopharyngeal Swab     Status: None   Collection Time: 07/30/21  3:21 PM   Specimen: Nasopharyngeal Swab; Nasopharyngeal(NP) swabs in vial transport medium  Result Value Ref Range Status   SARS Coronavirus 2 by RT PCR NEGATIVE NEGATIVE Final    Comment: (NOTE) SARS-CoV-2 target nucleic acids are NOT DETECTED.  The SARS-CoV-2 RNA is generally detectable in upper respiratory specimens during the acute phase of infection. The lowest concentration of SARS-CoV-2 viral copies this assay  can detect is 138 copies/mL. A negative result does not preclude SARS-Cov-2 infection and should not be used as the sole basis for treatment or other patient management decisions. A negative result may occur with  improper specimen collection/handling, submission of specimen other than nasopharyngeal swab, presence of viral mutation(s) within the areas targeted by this assay, and inadequate number of viral copies(<138 copies/mL). A negative result must be combined with clinical observations, patient history, and epidemiological information. The expected result is Negative.  Fact Sheet for Patients:  EntrepreneurPulse.com.au  Fact Sheet for Healthcare Providers:  IncredibleEmployment.be  This test is no t yet approved or cleared by the [place] FDA and  has been authorized for detection and/or diagnosis of SARS-CoV-2 by FDA under an Emergency Use Authorization (EUA). This EUA will remain  in effect (meaning this test can be used) for the duration of the COVID-19 declaration under Section 564(b)(1) of the Act, 21 U.S.C.section 360bbb-3(b)(1), unless the authorization is terminated  or revoked sooner.       Influenza A by PCR NEGATIVE NEGATIVE Final   Influenza B by PCR NEGATIVE NEGATIVE Final    Comment: (NOTE) The Xpert Xpress SARS-CoV-2/FLU/RSV plus assay is intended as an aid in the diagnosis of influenza from Nasopharyngeal swab specimens and should not be used as a sole basis for treatment. Nasal washings and aspirates are unacceptable for Xpert Xpress SARS-CoV-2/FLU/RSV testing.  Fact Sheet for Patients: EntrepreneurPulse.com.au  Fact Sheet for Healthcare Providers: IncredibleEmployment.be  This test is not yet approved or cleared by the [place] FDA and has been authorized for detection and/or diagnosis of SARS-CoV-2 by FDA under an Emergency Use Authorization (EUA). This EUA will  remain in effect (meaning this test can be used) for the duration of the COVID-19 declaration under Section 564(b)(1) of the Act, 21 U.S.C. section 360bbb-3(b)(1), unless the authorization is terminated or revoked.  Performed at [facility], [place] [address] [place], [place] [address]     FURTHER DISCHARGE INSTRUCTIONS:  Get Medicines reviewed and adjusted: Please take all your medications with you for your next visit with your Primary MD  Laboratory/radiological data: Please request your Primary MD to go over all hospital tests and procedure/radiological results at the follow up, please ask your Primary MD to get all Hospital records sent to his/her office.  In some cases, they will be blood work, cultures and biopsy results pending at the time of your discharge. Please request that your primary care M.D. goes through all the records of your hospital data and follows up on these results.  Also Note the following: If you experience worsening of your admission symptoms, develop shortness of breath, life threatening emergency, suicidal or homicidal thoughts you must seek medical attention immediately by calling 911  or calling your MD immediately  if symptoms less severe.  You must read complete instructions/literature along with all the possible adverse reactions/side effects for all the Medicines you take and that have been prescribed to you. Take any new Medicines after you have completely understood and accpet all the possible adverse reactions/side effects.   Do not drive when taking Pain medications or sleeping medications (Benzodaizepines)  Do not take more than prescribed Pain, Sleep and Anxiety Medications. It is not advisable to combine anxiety,sleep and pain medications without talking with your primary care practitioner  Special Instructions: If you have smoked or chewed Tobacco  in the last 2 yrs please stop smoking, stop any regular Alcohol  and or any Recreational drug  use.  Wear Seat belts while driving.  Please note: You were cared for by a hospitalist during your hospital stay. Once you are discharged, your primary care physician will handle any further medical issues. Please note that NO REFILLS for any discharge medications will be authorized once you are discharged, as it is imperative that you return to your primary care physician (or establish a relationship with a primary care physician if you do not have one) for your post hospital discharge needs so that they can reassess your need for medications and monitor your lab values.  Total Time spent coordinating discharge including counseling, education and face to face time equals 35 minutes.  Signed: Ariani Seier 08/01/2021 11:30 AM

## 2021-08-01 NOTE — Plan of Care (Signed)
  Problem: Education: Goal: Knowledge of General Education information will improve Description: Including pain rating scale, medication(s)/side effects and non-pharmacologic comfort measures Outcome: Adequate for Discharge   Problem: Health Behavior/Discharge Planning: Goal: Ability to manage health-related needs will improve Outcome: Adequate for Discharge   Problem: Clinical Measurements: Goal: Ability to maintain clinical measurements within normal limits will improve Outcome: Adequate for Discharge Goal: Will remain free from infection Outcome: Adequate for Discharge Goal: Diagnostic test results will improve Outcome: Adequate for Discharge Goal: Respiratory complications will improve Outcome: Adequate for Discharge Goal: Cardiovascular complication will be avoided Outcome: Adequate for Discharge   Problem: Activity: Goal: Risk for activity intolerance will decrease Outcome: Adequate for Discharge   Problem: Nutrition: Goal: Adequate nutrition will be maintained Outcome: Adequate for Discharge   Problem: Coping: Goal: Level of anxiety will decrease Outcome: Adequate for Discharge   Problem: Elimination: Goal: Will not experience complications related to bowel motility Outcome: Adequate for Discharge Goal: Will not experience complications related to urinary retention Outcome: Adequate for Discharge   Problem: Pain Managment: Goal: General experience of comfort will improve Outcome: Adequate for Discharge   Problem: Safety: Goal: Ability to remain free from injury will improve Outcome: Adequate for Discharge   Problem: Skin Integrity: Goal: Risk for impaired skin integrity will decrease Outcome: Adequate for Discharge   Problem: Education: Goal: Knowledge of disease or condition will improve Outcome: Adequate for Discharge Goal: Knowledge of the prescribed therapeutic regimen will improve Outcome: Adequate for Discharge Goal: Individualized Educational  Video(s) Outcome: Adequate for Discharge   Problem: Activity: Goal: Ability to tolerate increased activity will improve Outcome: Adequate for Discharge Goal: Will verbalize the importance of balancing activity with adequate rest periods Outcome: Adequate for Discharge   Problem: Respiratory: Goal: Ability to maintain a clear airway will improve Outcome: Adequate for Discharge Goal: Levels of oxygenation will improve Outcome: Adequate for Discharge Goal: Ability to maintain adequate ventilation will improve Outcome: Adequate for Discharge   

## 2021-08-07 ENCOUNTER — Telehealth: Payer: Self-pay | Admitting: Internal Medicine

## 2021-08-07 NOTE — Telephone Encounter (Signed)
Patient calling back in again regarding her discharge from hospital (see prev message below)

## 2021-08-07 NOTE — Telephone Encounter (Signed)
Patient says she was discharged from hospital w/ rx ipratropium-albuterol (DUONEB) 0.5-2.5 (3) MG/3ML   Says she has some questions she would like to ask nurse regarding this rx  Please call [phone]

## 2021-08-07 NOTE — Telephone Encounter (Signed)
Spoke with patient and discussed discharge medications and how to take them. Patient verbalizes understanding.

## 2021-08-12 ENCOUNTER — Other Ambulatory Visit: Payer: Self-pay | Admitting: Internal Medicine

## 2021-08-12 NOTE — Telephone Encounter (Signed)
Please refill as per office routine med refill policy (all routine meds to be refilled for 3 mo or monthly (per pt preference) up to one year from last visit, then month to month grace period for 3 mo, then further med refills will have to be denied) ? ?

## 2021-08-13 ENCOUNTER — Telehealth: Payer: Self-pay | Admitting: Internal Medicine

## 2021-08-13 ENCOUNTER — Telehealth: Payer: Self-pay | Admitting: Pulmonary Disease

## 2021-08-13 MED ORDER — ALBUTEROL SULFATE HFA 108 (90 BASE) MCG/ACT IN AERS: INHALATION_SPRAY | RESPIRATORY_TRACT | 4 refills | Status: DC

## 2021-08-13 NOTE — Telephone Encounter (Signed)
1.Medication Requested: albuterol (VENTOLIN HFA) 108 (90 Base) MCG/ACT inhaler  2. Pharmacy (Name, Street, [place]): [place], [place]  3. On Med List: yes   4. Last Visit with PCP: 04-19-2021  5. Next visit date with PCP: n/a   Agent: Please be advised that RX refills may take up to 3 business days. We ask that you follow-up with your pharmacy.

## 2021-08-13 NOTE — Telephone Encounter (Signed)
disregard

## 2021-08-23 ENCOUNTER — Other Ambulatory Visit: Payer: Self-pay

## 2021-08-23 NOTE — Patient Outreach (Signed)
[facility]) Care Management  [place]  08/23/2021   Jasmine [name] December 27, 1945 [id number]  Subjective: Telephone call to patient for follow up from nurse line. Patient concerned about possible lead paint in home. Patient given information to health department for lead paint testing.  COPD management continues.  No concerns.    Objective:   Encounter Medications:  Outpatient Encounter Medications as of 08/23/2021  Medication Sig   albuterol (VENTOLIN HFA) 108 (90 Base) MCG/ACT inhaler INHALE 1 TO 2 PUFFS INTO THE LUNGS EVERY 6 HOURS AS NEEDED FOR WHEEZING OR SHORTNESS OF BREATH   aspirin EC 81 MG EC tablet Take 1 tablet (81 mg total) by mouth daily.   atorvastatin (LIPITOR) 10 MG tablet TAKE 1 TABLET BY MOUTH DAILY. (Patient taking differently: Take 10 mg by mouth daily.)   citalopram (CELEXA) 20 MG tablet TAKE ONE TABLET BY MOUTH DAILY (Patient taking differently: Take 20 mg by mouth daily. TAKE ONE TABLET BY MOUTH DAILY)   Fluticasone-Umeclidin-Vilant (TRELEGY ELLIPTA) 200-62.5-25 MCG/INH AEPB Inhale 1 puff into the lungs daily.   furosemide (LASIX) 40 MG tablet 1 tab by mouth twice per day (Patient taking differently: Take 40 mg by mouth 2 (two) times daily.)   ipratropium-albuterol (DUONEB) 0.5-2.5 (3) MG/3ML SOLN Take 3 mLs by nebulization every 6 (six) hours as needed.   levothyroxine (SYNTHROID) 75 MCG tablet TAKE ONE TABLET BY MOUTH EARLY MORNING (Patient taking differently: Take 75 mcg by mouth daily before breakfast.)   OXYGEN Inhale 3 L into the lungs continuous. 24/7   potassium chloride (KLOR-CON) 10 MEQ tablet TAKE 1 TABLET BY MOUTH 2 TIMES DAILY. (Patient taking differently: Take 10 mEq by mouth 2 (two) times daily.)   predniSONE (DELTASONE) 5 MG tablet Take 8 tablets (40 mg) total by mouth daily for 2 days, 6 tabs (30 mg) daily for 2 days, 4 tabs (20 mg) daily for 2 days, 2 tabs (10 mg) daily for 2 days, and then resume usual dosing of 1 tablet (5  mg) daily.   No facility-administered encounter medications on file as of 08/23/2021.    Functional Status:  In your present state of health, do you have any difficulty performing the following activities: 07/31/2021 06/27/2021  Hearing? Y N  Vision? Y N  Difficulty concentrating or making decisions? Y Y  Comment - forgetful at times  Walking or climbing stairs? N N  Comment - shortness of breath  Dressing or bathing? Y N  Doing errands, shopping? N N  Preparing Food and eating ? - N  Using the Toilet? - N  In the past six months, have you accidently leaked urine? - Y  Comment - ocassional leak  Do you have problems with loss of bowel control? - N  Managing your Medications? - N  Managing your Finances? - N  Housekeeping or managing your Housekeeping? - N  Some recent data might be hidden    Fall/Depression Screening: Fall Risk  06/27/2021 04/19/2021 04/19/2021  Falls in the past year? 0 1 1  Number falls in past yr: - 0 0  Comment - - -  Injury with Fall? - 0 0  Risk Factor Category  - - -  Risk for fall due to : - - -  Follow up - - -   PHQ 2/9 Scores 06/27/2021 04/19/2021 04/19/2021 06/08/2020 04/16/2020 02/07/2020 02/06/2020  PHQ - 2 Score 0 1 0 0 0 0 0  PHQ- 9 Score - - - - - - -  Assessment:   Care Plan Care Plan : COPD (Adult)  Updates made by Jon Billings, RN since 08/23/2021 12:00 AM     Problem: Symptom Exacerbation (COPD)      Long-Range Goal: Symptom Exacerbation Prevented or Minimized as evidenced by no exacerbation of COPD   Start Date: 06/27/2021  Expected End Date: 04/18/2022  This Visit's Progress: On track  Priority: High  Note:   Evidence-based guidance:  Monitor for signs of respiratory infection, including changes in sputum color, volume and thickness, as well as fever.  Encourage infection prevention strategies that may include prophylactic antibiotic therapy for patients with history of frequent exacerbations or antibiotic administration during  exacerbation based on presentation, risk and benefit.  Encourage receipt of influenza and pneumococcal vaccine.  Prepare patient for use of home long-term oxygen therapy in presence of sever resting hypoxemia.  Prepare patients for laboratory studies or diagnostic exams, such as spirometry, pulse oximetry and arterial blood gas based on current symptoms, risk factors and presentation.  Assess barriers and manage adherence, including inhaler technique and persistent trigger exposure; encourage adherence, even when symptoms are controlled or infrequent.  Assess and monitor for signs/symptoms of psychosocial concerns, such as shortness of breath-anxiety cycle or depression that may impact stability of symptoms.  Identify economic resources, sociocultural beliefs, social factors and health literacy that may interfere with adherence.  Promote lifestyle changes when needed, including regular physical activity based on tolerance, weight loss, healthy eating and stress management.  Consider referral to nurse or community health worker or home-visiting program for intensive support and education (disease-management program).  Increase frequency of follow-up following exacerbation or hospitalization; consider transition of care interventions, such as hospital visit, home visit, telephone follow-up, review of discharge summary and resource referrals.   Notes:     Task: Identify and Minimize Risk of COPD Exacerbation   Due Date: 04/18/2022  Priority: Routine  Responsible User: Jon Billings, RN  Note:   Care Management Activities:    - healthy lifestyle promoted - rescue (action) plan reviewed    Notes: 06/25/21 Patient managing COPD.  COPD Management Discussed: Oxygen use- keeping oxygen levels above 80. Encouraged patient to pace self with activity. Reviewed importance of medication compliance. Reviewed when to call for physician for increased shortness of breath, increased sputum, fatigue that is not  his normal, cough, fever or sick contacts.  08/23/21 Patient reports she is doing ok but concerned about lead paint in her home.  Gave patient information to [facility] for lead pain testing.  Reiterated COPD management.        Goals Addressed             This Visit's Progress    Track and Manage My Symptoms-COPD   On track    Barriers: Health Behaviors Timeframe:  Long-Range Goal Priority:  High Start Date:     06/27/21                       Expected End Date:     04/18/22                  Follow Up Date 10/19/21   - follow rescue plan if symptoms flare-up - keep follow-up appointments    Why is this important?   Tracking your symptoms and other information about your health helps your doctor plan your care.  Write down the symptoms, the time of day, what you were doing and what medicine you are  taking.  You will soon learn how to manage your symptoms.     Notes: 06/27/21 Patient reports managing well.  Discussed COPD and BIPAP. COPD Management Discussed: Oxygen use- keeping oxygen levels above 80. Encouraged patient to pace self with activity. Reviewed importance of medication compliance. Reviewed when to call for physician for increased shortness of breath, increased sputum, fatigue that is not his normal, cough, fever or sick contacts.  08/23/21 Reiterated COPD management.  Patient wearing BIPAP at night.  No concerns.          Plan:  Follow-up: Patient agrees to Care Plan and Follow-up. Follow-up in 4-6 week(s)  [name], RN, MSN [place] Management Care Management Coordinator Direct Line [phone] Cell [phone] Toll Free: [phone]  Fax: [phone]

## 2021-09-24 ENCOUNTER — Other Ambulatory Visit: Payer: Self-pay | Admitting: Pulmonary Disease

## 2021-09-26 ENCOUNTER — Other Ambulatory Visit: Payer: Self-pay

## 2021-09-26 NOTE — Patient Outreach (Signed)
[facility]) Care Management  09/26/2021  SARAJEAN DESSERT 1945/10/22 [id number]   Telephone call to patient for disease management follow up.   No answer.  HIPAA compliant voice message left.    Plan: If no return call, RN CM will attempt patient again in March.  [name], RN, MSN [place] Management Care Management Coordinator Direct Line [phone] Cell [phone] Toll Free: [phone]  Fax: [phone]

## 2021-10-02 ENCOUNTER — Telehealth: Payer: Self-pay | Admitting: Internal Medicine

## 2021-10-03 ENCOUNTER — Telehealth: Payer: Self-pay | Admitting: Pulmonary Disease

## 2021-10-03 MED ORDER — PREDNISONE 5 MG PO TABS: 5.0000 mg | ORAL_TABLET | Freq: Every day | ORAL | 0 refills | Status: DC

## 2021-10-03 NOTE — Telephone Encounter (Signed)
Called and spoke with patient to let her know the recs from [place] about prednisone and get her scheduled for follow up appointment. Patient has been scheduled and sent in more prednisone to get her through to her appt. Nothing further needed at this time.   Next Appt With Pulmonology Rexene Edison, NP) 10/18/2021 at 11:00 AM

## 2021-10-03 NOTE — Telephone Encounter (Signed)
Called and spoke with patient. She was in the hospital back in October and was discharged with prednisone. She has now run out of the prednisone. I reviewed her medication list and it looks like a RX was sent by Dr. Oren Binet on 08/01/21 for 5mg . It was sent to a pharmacy that she does not use. I advised her that we can not refill another prescribers medication with their name and that it needed to come from someone in our office. She verbalized understanding.   I did attempt to get her scheduled for an appt since she has not been seen since March 2022 but she refused.   TP, can you please advise? Thanks.

## 2021-10-03 NOTE — Telephone Encounter (Signed)
Can restart prednisone 10mg  daily for 5 days then 5mg  daily -hold at this dose  Needs ov in next couple of weeks for check up in office  Please contact office for sooner follow up if symptoms do not improve or worsen or seek emergency care

## 2021-10-03 NOTE — Telephone Encounter (Signed)
Pt states RA had her on prednisone. Pt states when she was in the hospital, Dr Sloan Leiter rx'd it. Pt states Dr Sloan Leiter changed her dose. Pt states she felt like she was doing better on prednisone. Pt is now out of prednisone and would like RA to send in prednisone for her- states her breathing has suffered from not having prednisone. Please advise [phone] Pharmacy-[facility] on [address]

## 2021-10-07 ENCOUNTER — Ambulatory Visit: Payer: TRICARE For Life (TFL) | Admitting: Internal Medicine

## 2021-10-18 ENCOUNTER — Ambulatory Visit: Payer: TRICARE For Life (TFL) | Admitting: Adult Health

## 2021-10-25 ENCOUNTER — Telehealth: Payer: Self-pay | Admitting: Pulmonary Disease

## 2021-10-25 NOTE — Telephone Encounter (Signed)
Called patient but she did not answer. Left message for her to call us back.  

## 2021-10-25 NOTE — Telephone Encounter (Signed)
Pt added on to message: believes she has shingles as she is broken out in a rash for a few months. Routing as urgent now.

## 2021-11-02 ENCOUNTER — Other Ambulatory Visit: Payer: Self-pay | Admitting: Internal Medicine

## 2021-11-05 ENCOUNTER — Telehealth: Payer: Self-pay | Admitting: Pulmonary Disease

## 2021-11-05 ENCOUNTER — Telehealth: Payer: Self-pay | Admitting: Internal Medicine

## 2021-11-05 MED ORDER — IPRATROPIUM-ALBUTEROL 0.5-2.5 (3) MG/3ML IN SOLN: 3.0000 mL | Freq: Four times a day (QID) | RESPIRATORY_TRACT | 11 refills | Status: DC | PRN

## 2021-11-05 NOTE — Telephone Encounter (Signed)
Pt states she needs Duo nebs called into [facility], refill order placed by RN. RN also reviewed nebulizer machine operations. Pt understanding and said if she had any problems she would call office back. Nothing further needed at this time.

## 2021-11-05 NOTE — Telephone Encounter (Signed)
Connected to [facility] 1.14.2023.  Caller states she is having symptoms of possible COVID. Caller states he has a cough. Caller states she is having breathing difficulty and has COPD. Caller states she is wondering if there is a possibility that she has COVID even though she had the vaccines and boosters. Caller states she has a nebulizer machine but she does not have the medicine that goes with it. Caller states she used up her rescue inhaler but she will be able to have it delivered on Monday. Caller states she feels like she has a frog in her throat (phlegm buildup in her throat) that is causing some difficulty breathing. Caller states she is able to walk around. Caller states she is SOB when she is walking.  Advised to go to ED.

## 2021-11-06 ENCOUNTER — Telehealth: Payer: Self-pay | Admitting: Pulmonary Disease

## 2021-11-06 ENCOUNTER — Telehealth: Payer: Self-pay

## 2021-11-06 NOTE — Telephone Encounter (Signed)
Pt is wanting a referral to [place] care management for a nurse to cme out and help her keep up with her health at home and assist her with her up keep.  Phone number [phone].  Please contact pt with update or for any more concerns as she is wanting help with the checking of her bp and just overall health.  Pt number is [phone].

## 2021-11-06 NOTE — Telephone Encounter (Signed)
[facility] ordered, but I think what she is wanting may be more like private personal care that they may not be able to do

## 2021-11-07 NOTE — Telephone Encounter (Signed)
Patient states that she is needing assistance with some with certain daily activities such as getting bathed and dressed

## 2021-11-08 ENCOUNTER — Other Ambulatory Visit: Payer: Self-pay

## 2021-11-08 ENCOUNTER — Other Ambulatory Visit: Payer: Self-pay | Admitting: Internal Medicine

## 2021-11-08 DIAGNOSIS — I509 Heart failure, unspecified: Secondary | ICD-10-CM

## 2021-11-08 DIAGNOSIS — E039 Hypothyroidism, unspecified: Secondary | ICD-10-CM

## 2021-11-08 DIAGNOSIS — E089 Diabetes mellitus due to underlying condition without complications: Secondary | ICD-10-CM

## 2021-11-08 DIAGNOSIS — J432 Centrilobular emphysema: Secondary | ICD-10-CM

## 2021-11-08 NOTE — Patient Outreach (Signed)
[place] [facility]) Care Management  11/08/2021  MAIYAH GOYNE 04-Aug-1946 [id number]   Case Closure   Patient belongs to [facility] embedded CM services and case to be transferred.     Plan: RN CM will close case.  Enzo Montgomery, RN,BSN,CCM [place] Management Telephonic Care Management Coordinator Direct Phone: [phone] Toll Free: [phone] Fax: [phone]

## 2021-11-08 NOTE — Patient Outreach (Signed)
[facility]) Care Management  11/08/2021  WANNETTA LANGLAND 1945-11-28 [id number]   Received MD referral for chf from provider Dr. [name]. PCP has embedded RN sent [phone] for follow up.   Thank you, [place] Care Management Assistant

## 2021-11-12 ENCOUNTER — Telehealth: Payer: Self-pay | Admitting: *Deleted

## 2021-11-12 NOTE — Chronic Care Management (AMB) (Signed)
°  Chronic Care Management   Outreach Note  11/12/2021 Name: Jasmine [name] MRN: [id number] DOB: May 11, 1946  Jasmine [name] is a 76 y.o. year old female who is a primary care patient of Biagio Borg, MD. I reached out to Jasmine [name] by phone today in response to a referral sent by Jasmine [name]'s primary care provider.  An unsuccessful telephone outreach was attempted today. The patient was referred to the case management team for assistance with care management and care coordination.   Follow Up Plan: A HIPAA compliant phone message was left for the patient providing contact information and requesting a return call. The care management team will reach out to the patient again over the next 7 days. If patient returns call to provider office, please advise to call [place] at [phone].  [place] Management  Direct Dial: [phone]

## 2021-11-20 NOTE — Chronic Care Management (AMB) (Signed)
Chronic Care Management   Note  11/20/2021 Name: JANNELY HENTHORN MRN: [id number] DOB: Apr 21, 1946  JENNIFE ZAUCHA is a 76 y.o. year old female who is a primary care patient of Biagio Borg, MD. I reached out to [name] by phone today in response to a referral sent by Ms. Ida Rogue Craw's PCP.  Ms. Tortora was given information about Chronic Care Management services today including:  CCM service includes personalized support from designated clinical staff supervised by her physician, including individualized plan of care and coordination with other care providers 24/7 contact phone numbers for assistance for urgent and routine care needs. Service will only be billed when office clinical staff spend 20 minutes or more in a month to coordinate care. Only one practitioner may furnish and bill the service in a calendar month. The patient may stop CCM services at any time (effective at the end of the month) by phone call to the office staff. The patient is responsible for co-pay (up to 20% after annual deductible is met) if co-pay is required by the individual health plan.   Patient agreed to services and verbal consent obtained.   Follow up plan: Telephone appointment with care management team member scheduled for:11/21/21  [place] Management  Direct Dial: [phone]

## 2021-11-21 ENCOUNTER — Ambulatory Visit (INDEPENDENT_AMBULATORY_CARE_PROVIDER_SITE_OTHER): Payer: TRICARE For Life (TFL) | Admitting: *Deleted

## 2021-11-21 DIAGNOSIS — I5032 Chronic diastolic (congestive) heart failure: Secondary | ICD-10-CM

## 2021-11-21 DIAGNOSIS — J9611 Chronic respiratory failure with hypoxia: Secondary | ICD-10-CM

## 2021-11-21 NOTE — Chronic Care Management (AMB) (Signed)
Chronic Care Management   CCM RN Visit Note  11/21/2021 Name: Jasmine [name] MRN: [id number] DOB: [birth year]  Subjective: Jasmine [name] is a 76 y.o. year old female who is a primary care Jasmine of Biagio Borg, MD. The care management team was consulted for assistance with disease management and care coordination needs.    Engaged with Jasmine by telephone for initial visit in response to provider referral for case management and/or care coordination services.   Consent to Services:  The Jasmine was given information about Chronic Care Management services, agreed to services, and gave verbal consent 11/20/21 prior to initiation of services.  Please see initial visit note for detailed documentation.  Jasmine agreed to services and verbal consent obtained.   Assessment: Review of Jasmine past medical history, allergies, medications, health status, including review of consultants reports, laboratory and other test data, was performed as part of comprehensive evaluation and provision of chronic care management services.   SDOH (Social Determinants of Health) assessments and interventions performed:  SDOH Interventions    Flowsheet Row Most Recent Value  SDOH Interventions   Food Insecurity Interventions Other (Comment)  [Community Resource Care Guide referral placed]  Financial Strain Interventions Intervention Not Indicated  Housing Interventions Intervention Not Indicated  [single family one level home x 30 years,  lives with ex-husband,  reports need for updated front porch railings and  flooring- "falling apart,  and unsafe: has ramp coming off back,  [employer] placed]  Transportation Interventions Other (Comment)  [place] placed,  reports ex-husband who is also her roomate occasionally provides transportation, but he has "been sick" recently]     CCM Care Plan  Allergies  Allergen Reactions   Alendronate Sodium Other (See Comments)     Jasmine does not remember this reaction   Outpatient Encounter Medications as of 11/21/2021  Medication Sig   albuterol (VENTOLIN HFA) 108 (90 Base) MCG/ACT inhaler INHALE 1 TO 2 PUFFS INTO THE LUNGS EVERY 6 HOURS AS NEEDED FOR WHEEZING OR SHORTNESS OF BREATH   aspirin EC 81 MG EC tablet Take 1 tablet (81 mg total) by mouth daily.   atorvastatin (LIPITOR) 10 MG tablet TAKE 1 TABLET BY MOUTH DAILY. (Jasmine taking differently: Take 10 mg by mouth daily.)   citalopram (CELEXA) 20 MG tablet TAKE ONE TABLET BY MOUTH DAILY (Jasmine taking differently: Take 20 mg by mouth daily. TAKE ONE TABLET BY MOUTH DAILY)   Fluticasone-Umeclidin-Vilant (TRELEGY ELLIPTA) 200-62.5-25 MCG/INH AEPB Inhale 1 puff into the lungs daily.   furosemide (LASIX) 40 MG tablet 1 tab by mouth twice per day (Jasmine taking differently: Take 40 mg by mouth 2 (two) times daily.)   ipratropium-albuterol (DUONEB) 0.5-2.5 (3) MG/3ML SOLN Take 3 mLs by nebulization every 6 (six) hours as needed.   levothyroxine (SYNTHROID) 75 MCG tablet TAKE ONE TABLET BY MOUTH EARLY MORNING (Jasmine taking differently: Take 75 mcg by mouth daily before breakfast.)   OXYGEN Inhale 3 L into the lungs continuous. 24/7   potassium chloride (KLOR-CON) 10 MEQ tablet TAKE 1 TABLET BY MOUTH 2 TIMES DAILY. (Jasmine taking differently: Take 10 mEq by mouth 2 (two) times daily.)   predniSONE (DELTASONE) 5 MG tablet Take 8 tablets (40 mg) total by mouth daily for 2 days, 6 tabs (30 mg) daily for 2 days, 4 tabs (20 mg) daily for 2 days, 2 tabs (10 mg) daily for 2 days, and then resume usual dosing of 1 tablet (5 mg) daily.  predniSONE (DELTASONE) 5 MG tablet Take 1 tablet (5 mg total) by mouth daily with breakfast.   No facility-administered encounter medications on file as of 11/21/2021.   Jasmine Active Problem List   Diagnosis Date Noted   COPD exacerbation ([place]) 07/30/2021   Diabetes mellitus due to underlying condition without complication, without long-term  current use of insulin ([place]) 07/22/2021   Steroid-dependent chronic obstructive pulmonary disease ([place]) 07/22/2021   Postablative hypothyroidism 07/22/2021   Encounter for well adult exam with abnormal findings 04/20/2021   Malnutrition of moderate degree 08/16/2019   Respiratory acidosis 03/07/2019   Goals of care, counseling/discussion 09/20/2018   Edema due to congestive heart failure ([place]) 07/27/2018   Palliative care by specialist    Shortness of breath    Chronic low back pain 04/30/2018   COPD with acute exacerbation ([place])    Hypothyroidism    Anxiety    Respiratory failure with hypercapnia ([place]) 03/24/2018   Hypercarbia 03/22/2018   Polysubstance abuse ([place]) 01/26/2018   Aspiration into airway 06/03/2017   Rash 07/12/2016   Bilateral hearing loss 06/05/2016   Hyperglycemia 05/21/2016   Abnormal LFTs 03/24/2016   Solitary pulmonary nodule 01/09/2016   Chronic respiratory failure with hypoxia ([place])    On home oxygen therapy    Chronic diastolic congestive heart failure ([place])    Bacteremia Step vir[facility]ans 12/27/2015   Former smoker 12/25/2015   Hypothyroidism following radioiodine therapy 09/26/2014   Protein-calorie malnutrition, severe ([place]) 06/23/2014   Cor pulmonale ([place]) 01/12/2013   COPD (chronic obstructive pulmonary disease) ([place]) 11/29/2011   Macrocytosis without anemia 11/09/2011   Palliative care encounter 04/09/2011   SINUSITIS, CHRONIC 10/10/2010   Fatigue 10/10/2010   Depression with anxiety 07/21/2008   Essential hypertension 05/01/2007   ALLERGIC RHINITIS 05/01/2007   Asthma 05/01/2007   OSTEOPOROSIS 05/01/2007   Conditions to be addressed/monitored:  CHF and COPD  Care Plan : COPD (Adult)  Updates made by Knox Royalty, RN since 11/21/2021 12:00 AM     Problem: Symptom Exacerbation (COPD)      Long-Range Goal: Symptom Exacerbation Prevented or Minimized as evidenced by no exacerbation of COPD Completed 11/21/2021  Start Date: 06/27/2021  Expected  End Date: 04/18/2022  Recent Progress: On track  Priority: High  Note:   Evidence-based guidance:  Monitor for signs of respiratory infection, including changes in sputum color, volume and thickness, as well as fever.  Encourage infection prevention strategies that may include prophylactic antibiotic therapy for patients with history of frequent exacerbations or antibiotic administration during exacerbation based on presentation, risk and benefit.  Encourage receipt of influenza and pneumococcal vaccine.  Prepare Jasmine for use of home long-term oxygen therapy in presence of sever resting hypoxemia.  Prepare patients for laboratory studies or diagnostic exams, such as spirometry, pulse oximetry and arterial blood gas based on current symptoms, risk factors and presentation.  Assess barriers and manage adherence, including inhaler technique and persistent trigger exposure; encourage adherence, even when symptoms are controlled or infrequent.  Assess and monitor for signs/symptoms of psychosocial concerns, such as shortness of breath-anxiety cycle or depression that may impact stability of symptoms.  Identify economic resources, sociocultural beliefs, social factors and health literacy that may interfere with adherence.  Promote lifestyle changes when needed, including regular physical activity based on tolerance, weight loss, healthy eating and stress management.  Consider referral to nurse or community health worker or home-visiting program for intensive support and education (disease-management program).  Increase frequency of follow-up following exacerbation or hospitalization;  consider transition of care interventions, such as hospital visit, home visit, telephone follow-up, review of discharge summary and resource referrals.   Notes:     Care Plan : RN Care Manager Plan of Care  Updates made by Knox Royalty, RN since 11/21/2021 12:00 AM     Problem: Chronic Disease Management Needs    Priority: High     Long-Range Goal: Development of plan of care for long term chronic disease management   Start Date: 11/21/2021  Expected End Date: 11/21/2022  Priority: High  Note:   Current Barriers:  Chronic Disease Management support and education needs related to CHF and COPD Frequent falls- Jasmine reports 2-plus recent falls, both without serious injury; reports uses walker "all the time" Fragile state of health, multiple progressing severe chronic health conditions: on continuous home O2 at 3 L/min Most recent hospitalization October 11-13, 2022 for acute on chronic respiratory failure/ CHF exacerbation Inconsistent adherence to attending provider appointments Cancelled post-hospital discharge provider office visits to pulmonary provider 10/18/21; no-show to PCP 10/07/22 Was previously active with outpatient palliative care team; palliative care case closure 05/16/21: they were unable to maintain contact with Jasmine for ongoing palliative care services/ care SDOH concerns as follows: Transportation: reports does not drive self; can usually depend on roommate (who happens to be her ex-husband) and/ or extended family ("nieces and nephews"), however, she reports, "they are not doing so good lately either;" she is uncertain if she will be able to rely on these family members in the future Finances and food acquisition, in light of current economy: reports does not qualify for food stamps or medicaid, "they say I make too much money" Home in need of repair: reports "front porch steps needs sturdy railing, and the steps need repair;" confirms she does have a ramp that she is able to use off the back entry Need for assistance with hygiene/ bathing, household management/ chores, shopping, and errands  RNCM Clinical Goal(s):  Jasmine will demonstrate improved health management independence as evidenced by adherence to plan of care for COPD; CHF        through collaboration with RN Care  manager, provider, and care team.   Interventions: 1:1 collaboration with primary care provider regarding development and update of comprehensive plan of care as evidenced by provider attestation and co-signature Inter-disciplinary care team collaboration (see longitudinal plan of care) Evaluation of current treatment plan related to  self management and Jasmine's adherence to plan as established by provider 11/21/21: Initial assessment initiated: needs completion; second outreach scheduled with Jasmine inn 2 weeks to hopefully complete SDOH assessment completed: see above, in barriers- [place] referral placed 11/21/21 Briefly reviewed hospitalization Oct 11-13, 2022: acute- chronic respiratory failure/ CHF exacerbation; confirmed no subsequent hospitalizations to date Falls assessment completed: frequent falls without injury; uses walker "all the time;" reports calls EMS as needed when she falls; occasionally her roommate/ ex-husband can assist in getting her up; fall risks/ prevention education provided Medications discussed: Jasmine reports independently self-manages medications; uses weekly pill planner box; denies medication concerns today; endorses adherence to taking medications as prescribed, including diuretics; uses outpatient pharmacy that delivers medications Discussed general clinical condition: Jasmine reports she believes her "breathing is getting worse lately;" and also complains "I have a skin irritation issue that I am not sure what it is;" encouraged Jasmine to schedule prompt provider appointments to pulmonary and PCP providers   Heart Failure/ COPD Interventions:  (Status: 11/21/21: New goal.)  Long Term Goal  Wt Readings from Last 3 Encounters:  08/01/21 124 lb 12.5 oz (56.6 kg)  07/22/21 130 lb (59 kg)  04/19/21 130 lb (59 kg)  Basic overview and discussion of pathophysiology of Heart Failure reviewed Assessed need for readable accurate scales in  home Reviewed role of diuretics in prevention of fluid overload and management of heart failure Discussed the importance of keeping all appointments with provider Provided Jasmine with basic written and verbal COPD education on self care/management/and exacerbation prevention Provided instruction about proper use of medications used for management of COPD including inhalers Advised Jasmine to self assesses COPD action plan zone and make appointment with provider if in the yellow zone for 48 hours without improvement Provided education about and advised Jasmine to utilize infection prevention strategies to reduce risk of respiratory infection Discussed the importance of adequate rest and management of fatigue with COPD  Confirmed Jasmine received and is using nebulizer medication, per pulmonary provider order 11/05/21- reports using "several times" this week; she also endorses using rescue inhaler "about every 4 hours when I am awake;" confirmed she endorses using maintenance inhaler as prescribed QD (Trelegy) Reports shortness of breath when "just walking around my house;" confirms she uses continuous O2 at home, 2-3 L/min Confirmed Jasmine unable to obtain daily weights at home- fall risk; discussed/ provided education around signs/ symptoms yellow CHF/ COPD zones, along with corresponding action plans Reports baseline swelling of lower extremities Confirms she obtained flu and COVID vaccine series, states "up to date on vaccines"  Jasmine Goals/Self-Care Activities: As evidenced by review of EHR, collaboration with care team, and Jasmine reporting during CCM RN CM outreach,  Jasmine [name] will: Take medications as prescribed Attend all scheduled provider appointments Call pharmacy for medication refills Call provider office for new concerns or questions Please listen for a call from the [facility] team- I have asked them to call you to discuss community resources that may  be available for you for: Transportation needs Home Repair needs Financial and food resources, including grocery delivery services In-home care assistance with bathing/ hygiene and household chores Once you have received the requested resources, pease call the resources to obtain information about their services  Please make appointment with your lung (breathing) doctor and with [name], your Primary Care doctor Review enclosed educational material       Plan: Telephone follow up appointment with care management team member scheduled for:  Tuesday, December 03, 2021 at 2:30 pm The Jasmine has been provided with contact information for the care management team and has been advised to call with any health related questions or concerns  [name], RN, BSN, [place] [phone]: direct office

## 2021-11-21 NOTE — Patient Instructions (Addendum)
Visit [name], thank you for taking time to talk with me today. Please don't hesitate to contact me if I can be of assistance to you before our next scheduled telephone appointment.  Below are the goals we discussed today:  Patient Self-Care Activities: Patient Jasmine [name] will: Take medications as prescribed Attend all scheduled provider appointments Call pharmacy for medication refills Call provider office for new concerns or questions Please listen for a call from the [place] team- I have asked them to call you to discuss community resources that may be available for you for: Transportation needs Home Repair needs Financial and food resources, including grocery delivery services In-home care assistance with bathing/ hygiene and household chores Once you have received the requested resources, pease call the resources to obtain information about their services  Please make appointment with your lung (breathing) doctor and with [name], your Primary Care doctor Review enclosed educational material   Our next scheduled telephone follow up visit/ appointment with care management team member is scheduled on:   December 03, 2021 at 2:30 pm- This is a PHONE [place] appointment  If you need to cancel or re-schedule our visit, please call [phone] and our care guide team will be happy to assist you.   I look forward to hearing about your progress.   [name], RN, BSN, [place] [phone]: direct office  If you are experiencing a Mental Health or [place] or need someone to talk to, please  call the Suicide and Crisis Lifeline: 988 call the [place] National Suicide Prevention Lifeline: (518)155-2485 or TTY: 330-197-4335 TTY 843-400-6833) to talk to a trained counselor call 1-800-273-TALK (toll free, 24 hour hotline) go to [facility] [place] [phone]) call 911   Shortness of Breath, Adult Shortness of breath means you have trouble breathing. Shortness of breath could be a sign of a medical problem. Follow these instructions at home: Pollution Do not smoke or use any products that contain nicotine or tobacco. If you need help quitting, ask your doctor. Avoid things that can make it harder to breathe, such as: Smoke of all kinds. This includes smoke from campfires or forest fires. Do not smoke or allow others to smoke in your home. Mold. Dust. Air pollution. Chemical smells. Things that can give you an allergic reaction (allergens) if you have allergies. Keep your living space clean. Use products that help remove mold and dust. General instructions Watch for any changes in your symptoms. Take over-the-counter and prescription medicines only as told by your doctor. This includes oxygen therapy and inhaled medicines. Rest as needed. Return to your normal activities when your doctor says that it is safe. Keep all follow-up visits. Contact a doctor if: Your condition does not get better as soon as expected. You have a hard time doing your normal activities, even after you rest. You have new symptoms. You cannot walk up stairs. You cannot exercise the way you normally do. Get help right away if: Your shortness of breath gets worse. You have trouble breathing when you are resting. You feel light-headed or you faint. You have a cough that is not helped by medicines. You cough up blood. You have pain with breathing. You have pain in your chest, arms, shoulders, or belly (abdomen). You have a fever. These symptoms may be an emergency. Get help right away. Call 911. Do not wait  to see if the symptoms will go away. Do not drive yourself to the hospital. Summary Shortness of breath is when you have trouble breathing enough air. It can be a sign of a medical problem. Avoid things that make it hard  for you to breathe, such as smoking, pollution, mold, and dust. Watch for any changes in your symptoms. Contact your doctor if you do not get better or you get worse. This information is not intended to replace advice given to you by your health care provider. Make sure you discuss any questions you have with your health care provider. Document Revised: 05/25/2021 Document Reviewed: 05/25/2021 Elsevier Patient Education  [place].  COPD Action Plan A COPD action plan is a description of what to do when you have a flare (exacerbation) of chronic obstructive pulmonary disease (COPD). Your action plan is a color-coded plan that lists the symptoms that indicate whether your condition is under control and what actions to take. If you have symptoms in the green zone, it means you are doing well that day. If you have symptoms in the yellow zone, it means you are having a bad day or an exacerbation. If you have symptoms in the red zone, you need urgent medical care. Follow the plan that you and your health care provider developed. Review your plan with your health care provider at each visit. Red zone Symptoms in this zone mean that you should get medical help right away. They include: Feeling very short of breath, even when you are resting. Not being able to do any activities because of poor breathing. Not being able to sleep because of poor breathing. Fever or shaking chills. Feeling confused or very sleepy. Chest pain. Coughing up blood. If you have any of these symptoms, call emergency services (911 in the [place].S[place] or go to the nearest emergency room. Yellow zone Symptoms in this zone mean that your condition may be getting worse. They include: Feeling more short of breath than usual. Having less energy for daily activities than usual. Phlegm or mucus that is thicker than usual. Needing to use your rescue inhaler or nebulizer more often than usual. More ankle swelling than  usual. Coughing more than usual. Feeling like you have a chest cold. Trouble sleeping due to COPD symptoms. Decreased appetite. COPD medicines not helping as much as usual. If you experience any "yellow" symptoms: Keep taking your daily medicines as directed. Use your quick-relief inhaler as told by your health care provider. If you were prescribed steroid medicine to take by mouth (oral medicine), start taking it as told by your health care provider. If you were prescribed an antibiotic medicine, start taking it as told by your health care provider. Do not stop taking the antibiotic even if you start to feel better. Use oxygen as told by your health care provider. Get more rest. Do your pursed-lip breathing exercises. Do not smoke. Avoid any irritants in the air. If your signs and symptoms do not improve after taking these steps, call your health care provider right away. Green zone Symptoms in this zone mean that you are doing well. They include: Being able to do your usual activities and exercise. Having the usual amount of coughing, including the same amount of phlegm or mucus. Being able to sleep well. Having a good appetite. Where to find more information: You can find more information about COPD from: American Lung Association, My COPD Action Plan: www.lung.org COPD Foundation: www.copdfoundation.[place]:  https://wilson-eaton.com/ Follow these instructions at home: Continue taking your daily medicines as told by your health care provider. Make sure you receive all the immunizations that your health care provider recommends, especially the pneumococcal and influenza vaccines. Wash your hands often with soap and water. Have family members wash their hands too. Regular hand washing can help prevent infections. Follow your usual exercise and diet plan. Avoid irritants in the air, such as smoke. Do not use any products that contain nicotine or tobacco.  These products include cigarettes, chewing tobacco, and vaping devices, such as e-cigarettes. If you need help quitting, ask your health care provider. Summary A COPD action plan tells you what to do when you have a flare (exacerbation) of chronic obstructive pulmonary disease (COPD). Follow each action plan for your symptoms. If you have any symptoms in the red zone, call emergency services (911 in the [place] or go to the nearest emergency room. This information is not intended to replace advice given to you by your health care provider. Make sure you discuss any questions you have with your health care provider. Document Revised: 08/14/2020 Document Reviewed: 08/14/2020 Elsevier Patient Education  2022 [employer].   Following is a copy of your full care plan:  Care Plan : COPD (Adult)  Updates made by Knox Royalty, RN since 11/21/2021 12:00 AM     Problem: Symptom Exacerbation (COPD)      Long-Range Goal: Symptom Exacerbation Prevented or Minimized as evidenced by no exacerbation of COPD Completed 11/21/2021  Start Date: 06/27/2021  Expected End Date: 04/18/2022  Recent Progress: On track  Priority: High  Note:   Evidence-based guidance:  Monitor for signs of respiratory infection, including changes in sputum color, volume and thickness, as well as fever.  Encourage infection prevention strategies that may include prophylactic antibiotic therapy for patients with history of frequent exacerbations or antibiotic administration during exacerbation based on presentation, risk and benefit.  Encourage receipt of influenza and pneumococcal vaccine.  Prepare patient for use of home long-term oxygen therapy in presence of sever resting hypoxemia.  Prepare patients for laboratory studies or diagnostic exams, such as spirometry, pulse oximetry and arterial blood gas based on current symptoms, risk factors and presentation.  Assess barriers and manage adherence, including inhaler technique and  persistent trigger exposure; encourage adherence, even when symptoms are controlled or infrequent.  Assess and monitor for signs/symptoms of psychosocial concerns, such as shortness of breath-anxiety cycle or depression that may impact stability of symptoms.  Identify economic resources, sociocultural beliefs, social factors and health literacy that may interfere with adherence.  Promote lifestyle changes when needed, including regular physical activity based on tolerance, weight loss, healthy eating and stress management.  Consider referral to nurse or community health worker or home-visiting program for intensive support and education (disease-management program).  Increase frequency of follow-up following exacerbation or hospitalization; consider transition of care interventions, such as hospital visit, home visit, telephone follow-up, review of discharge summary and resource referrals.   Notes:     Care Plan : RN Care Manager Plan of Care  Updates made by Knox Royalty, RN since 11/21/2021 12:00 AM     Problem: Chronic Disease Management Needs   Priority: High     Long-Range Goal: Development of plan of care for long term chronic disease management   Start Date: 11/21/2021  Expected End Date: 11/21/2022  Priority: High  Note:   Current Barriers:  Chronic Disease Management support and education needs related to CHF and COPD  Frequent falls- patient reports 2-plus recent falls, both without serious injury; reports uses walker "all the time" Fragile state of health, multiple progressing severe chronic health conditions: on continuous home O2 at 3 L/min Most recent hospitalization October 11-13, 2022 for acute on chronic respiratory failure/ CHF exacerbation Inconsistent adherence to attending provider appointments Cancelled post-hospital discharge provider office visits to pulmonary provider 10/18/21; no-show to PCP 10/07/22 Was previously active with outpatient palliative care team;  palliative care case closure 05/16/21: they were unable to maintain contact with patient for ongoing palliative care services/ care SDOH concerns as follows: Transportation: reports does not drive self; can usually depend on roommate (who happens to be her ex-husband) and/ or extended family ("nieces and nephews"), however, she reports, "they are not doing so good lately either;" she is uncertain if she will be able to rely on these family members in the future Finances and food acquisition, in light of current economy: reports does not qualify for food stamps or medicaid, "they say I make too much money" Home in need of repair: reports "front porch steps needs sturdy railing, and the steps need repair;" confirms she does have a ramp that she is able to use off the back entry Need for assistance with hygiene/ bathing, household management/ chores, shopping, and errands  RNCM Clinical Goal(s):  Patient will demonstrate improved health management independence as evidenced by adherence to plan of care for COPD; CHF        through collaboration with RN Care manager, provider, and care team.   Interventions: 1:1 collaboration with primary care provider regarding development and update of comprehensive plan of care as evidenced by provider attestation and co-signature Inter-disciplinary care team collaboration (see longitudinal plan of care) Evaluation of current treatment plan related to  self management and patient's adherence to plan as established by provider 11/21/21: Initial assessment initiated: needs completion; second outreach scheduled with patient inn 2 weeks to hopefully complete SDOH assessment completed: see above, in barriers- [place] referral placed 11/21/21 Briefly reviewed hospitalization Oct 11-13, 2022: acute- chronic respiratory failure/ CHF exacerbation; confirmed no subsequent hospitalizations to date Falls assessment completed: frequent falls without injury;  uses walker "all the time;" reports calls EMS as needed when she falls; occasionally her roommate/ ex-husband can assist in getting her up; fall risks/ prevention education provided Medications discussed: patient reports independently self-manages medications; uses weekly pill planner box; denies medication concerns today; endorses adherence to taking medications as prescribed, including diuretics; uses outpatient pharmacy that delivers medications Discussed general clinical condition: patient reports she believes her "breathing is getting worse lately;" and also complains "I have a skin irritation issue that I am not sure what it is;" encouraged patient to schedule prompt provider appointments to pulmonary and PCP providers   Heart Failure/ COPD Interventions:  (Status: 11/21/21: New goal.)  Long Term Goal  Wt Readings from Last 3 Encounters:  08/01/21 124 lb 12.5 oz (56.6 kg)  07/22/21 130 lb (59 kg)  04/19/21 130 lb (59 kg)  Basic overview and discussion of pathophysiology of Heart Failure reviewed Assessed need for readable accurate scales in home Reviewed role of diuretics in prevention of fluid overload and management of heart failure Discussed the importance of keeping all appointments with provider Provided patient with basic written and verbal COPD education on self care/management/and exacerbation prevention Provided instruction about proper use of medications used for management of COPD including inhalers Advised patient to self assesses COPD action plan zone and make appointment with provider  if in the yellow zone for 48 hours without improvement Provided education about and advised patient to utilize infection prevention strategies to reduce risk of respiratory infection Discussed the importance of adequate rest and management of fatigue with COPD  Confirmed patient received and is using nebulizer medication, per pulmonary provider order 11/05/21- reports using "several times" this week;  she also endorses using rescue inhaler "about every 4 hours when I am awake;" confirmed she endorses using maintenance inhaler as prescribed QD (Trelegy) Reports shortness of breath when "just walking around my house;" confirms she uses continuous O2 at home, 2-3 L/min Confirmed patient unable to obtain daily weights at home- fall risk; discussed/ provided education around signs/ symptoms yellow CHF/ COPD zones, along with corresponding action plans Reports baseline swelling of lower extremities Confirms she obtained flu and COVID vaccine series, states "up to date on vaccines"  Patient Goals/Self-Care Activities: As evidenced by review of EHR, collaboration with care team, and patient reporting during CCM RN CM outreach,  Patient Adajah will: Take medications as prescribed Attend all scheduled provider appointments Call pharmacy for medication refills Call provider office for new concerns or questions Please listen for a call from the [place] team- I have asked them to call you to discuss community resources that may be available for you for: Transportation needs Home Repair needs Financial and food resources, including grocery delivery services In-home care assistance with bathing/ hygiene and household chores Once you have received the requested resources, pease call the resources to obtain information about their services  Please make appointment with your lung (breathing) doctor and with [name], your Primary Care doctor Review enclosed educational material       Consent to CCM Services: Ms. Blizzard was given information about Chronic Care Management services 11/20/21 including:  CCM service includes personalized support from designated clinical staff supervised by her physician, including individualized plan of care and coordination with other care providers 24/7 contact phone numbers for assistance for urgent and routine care needs. Service will only be billed  when office clinical staff spend 20 minutes or more in a month to coordinate care. Only one practitioner may furnish and bill the service in a calendar month. The patient may stop CCM services at any time (effective at the end of the month) by phone call to the office staff. The patient will be responsible for cost sharing (co-pay) of up to 20% of the service fee (after annual deductible is met).  Patient agreed to services and verbal consent obtained.   The patient verbalized understanding of instructions, educational materials, and care plan provided today and agreed to receive a mailed copy of patient instructions, educational materials, and care plan Telephone follow up appointment with care management team member scheduled for:  Tuesday, December 03, 2021 at 2:30 pm The patient has been provided with contact information for the care management team and has been advised to call with any health related questions or concerns

## 2021-11-22 ENCOUNTER — Telehealth: Payer: Self-pay | Admitting: *Deleted

## 2021-11-22 NOTE — Telephone Encounter (Signed)
° °  Telephone encounter was:  Successful.  11/22/2021 Name: Jasmine [name] MRN: [id number] DOB: Dec 30, 1945  Jasmine [name] is a 76 y.o. year old female who is a primary care patient of Biagio Borg, MD . The community resource team was consulted for assistance with  food and insurance concerns . Called patient and we addressed her concerns about her current plan and why it had changed aftr calling [place] plan, humana and Blue cross and blue shield we were able to have both plans reinstated   Care guide performed the following interventions: Patient provided with information about care guide support team and interviewed to confirm resource needs Follow up call placed to community resources to determine status of patients referral.Called patient and we addressed her concerns about her current plan and why it had changed after calling [place] plan, Hum[employer] and Blue cross and blue shield we were able to have her change everything back to how she wanted, and she will have transportation benefits with the new [employer] Gold plan. Patient will need transportation from [facility] until the benefits take effect on 12/18/2021. The [place] plan reference number is #[id number] [place] plan phone number [phone] Patient completed application to [place] Human gold supplement and will automatically switch back to [place]  [facility] plan  Follow Up Plan:  , Will follow up on Monday about transportation through [facility] until insurance is activated   [place], Care Management  [phone] [address] , [place] [address] Email : [email or web address]

## 2021-11-25 ENCOUNTER — Telehealth: Payer: Self-pay | Admitting: *Deleted

## 2021-11-25 NOTE — Telephone Encounter (Signed)
° °  Telephone encounter was:  Unsuccessful.  11/25/2021 Name: Jasmine [name] MRN: [id number] DOB: 07-Feb-1946  Unsuccessful outbound call made today to assist with:  Transportation Needs  and Food Insecurity  Outreach Attempt:  2nd Attempt  No answer [place], Care Management  [phone] [address] , [place] [address] Email : [email or web address]

## 2021-11-28 ENCOUNTER — Other Ambulatory Visit: Payer: Self-pay

## 2021-11-28 ENCOUNTER — Ambulatory Visit: Payer: TRICARE For Life (TFL) | Admitting: Adult Health

## 2021-11-28 ENCOUNTER — Telehealth: Payer: Self-pay | Admitting: *Deleted

## 2021-11-28 ENCOUNTER — Ambulatory Visit (INDEPENDENT_AMBULATORY_CARE_PROVIDER_SITE_OTHER): Payer: Medicare PPO | Admitting: Internal Medicine

## 2021-11-28 VITALS — BP 110/68 | Temp 98.7°F | Ht 61.0 in

## 2021-11-28 DIAGNOSIS — J9611 Chronic respiratory failure with hypoxia: Secondary | ICD-10-CM

## 2021-11-28 DIAGNOSIS — I10 Essential (primary) hypertension: Secondary | ICD-10-CM

## 2021-11-28 DIAGNOSIS — J449 Chronic obstructive pulmonary disease, unspecified: Secondary | ICD-10-CM | POA: Diagnosis not present

## 2021-11-28 DIAGNOSIS — I5032 Chronic diastolic (congestive) heart failure: Secondary | ICD-10-CM

## 2021-11-28 MED ORDER — PREDNISONE 10 MG PO TABS: ORAL_TABLET | ORAL | 0 refills | Status: DC

## 2021-11-28 MED ORDER — METHYLPREDNISOLONE ACETATE 80 MG/ML IJ SUSP
80.0000 mg | Freq: Once | INTRAMUSCULAR | Status: AC
  Administered 2021-11-28: 80 mg via INTRAMUSCULAR

## 2021-11-28 NOTE — Patient Instructions (Addendum)
You had the steroid shot today  Please take all new medication as prescribed  - the prednisone  Please continue all other medications as before, and refills have been done if requested.  Please have the pharmacy call with any other refills you may need.  Please continue your efforts at being more active, low cholesterol diet, and weight control.  Please keep your appointments with your specialists as you may have planned  Please make an Appointment to return in 3 months, or sooner if needed

## 2021-11-28 NOTE — Telephone Encounter (Signed)
° °  Telephone encounter was:  Successful.  11/28/2021 Name: ANET LOGSDON MRN: [id number] DOB: Aug 18, 1946  BENAY POMEROY is a 76 y.o. year old female who is a primary care patient of Jenny Reichmann, Hunt Oris, MD . The community resource team was consulted for assistance with Transportation Needs  and [place] guide performed the following interventions: Patient provided with information about care guide support team and interviewed to confirm resource needs.Patient was letting me know she had transportation to the upcoming appointments and will let me know when she receives her new cards .  Follow Up Plan:  Care guide will follow up with patient by phone over the next days  [place], Care Management  [phone] [address] , [place] [address] Email : [email or web address]

## 2021-11-28 NOTE — Progress Notes (Signed)
Patient ID: Jasmine [name], female   DOB: Jul 14, 1946, 76 y.o.   MRN: [id number]        Chief Complaint: follow up chronic resp failure, copd worsening, chf, htn       HPI:  Jasmine [name] is a 76 y.o. female here with hx of copd steroid dependent c/o 2-3 days onset mild worsening sob doe wheezing with non prod cough, no fever and Pt denies chest pain, orthopnea, PND, increased LE swelling, palpitations, dizziness or syncope.  Denies hyper or hypo thyroid symptoms such as voice, skin or hair change.   Pt denies polydipsia, polyuria, or new focal neuro s/s.    Wt Readings from Last 3 Encounters:  08/01/21 124 lb 12.5 oz (56.6 kg)  07/22/21 130 lb (59 kg)  04/19/21 130 lb (59 kg)   BP Readings from Last 3 Encounters:  11/28/21 110/68  08/01/21 122/71  07/22/21 110/62         Past Medical History:  Diagnosis Date   ALLERGIC RHINITIS 05/01/2007   Anxiety    Arthritis    "hands" (12/28/2015)   ASTHMA 05/01/2007   "since I was a child"   Cancer ([place])    LUNG   CHF (congestive heart failure) ([place])    Chronic bronchitis (HCC)    COPD (chronic obstructive pulmonary disease) ([place])    DEPRESSION 07/21/2008   DVT (deep venous thrombosis) ([place]) 1960s   LLE   FATIGUE 10/10/2010   HYPERTENSION 05/01/2007   HYPOTHYROIDISM 08/23/2009   s/p RAI   LUMBAR RADICULOPATHY, RIGHT 08/25/2008   On home oxygen therapy    "3-4L; qd; all the time" (12/28/2015)   OSTEOPOROSIS 05/01/2007   SHOULDER PAIN, LEFT 08/23/2009   SINUSITIS, CHRONIC 10/10/2010   Past Surgical History:  Procedure Laterality Date   APPENDECTOMY     DILATION AND CURETTAGE OF UTERUS  multiple   history of multiple dialations and curettages and miscarriages, unfortunately never carrying a child to term   ECTOPIC PREGNANCY SURGERY  "late '60s or early '70s   ELECTROCARDIOGRAM  06/20/2006   OOPHORECTOMY Right     reports that she quit smoking about 5 years ago. Her smoking use included cigarettes. She started smoking about 58 years ago.  She has a 52.00 pack-year smoking history. She has never used smokeless tobacco. She reports that she does not currently use alcohol. She reports that she does not currently use drugs. family history includes Alcohol abuse in her brother; Asthma in an other family member; Diabetes in her brother; Hypertension in an other family member; Lung cancer in her father; Stroke in her mother; Thyroid disease in her mother. Allergies  Allergen Reactions   Alendronate Sodium Other (See Comments)    Patient does not remember this reaction   Current Outpatient Medications on File Prior to Visit  Medication Sig Dispense Refill   albuterol (VENTOLIN HFA) 108 (90 Base) MCG/ACT inhaler INHALE 1 TO 2 PUFFS INTO THE LUNGS EVERY 6 HOURS AS NEEDED FOR WHEEZING OR SHORTNESS OF BREATH 18 g 4   aspirin EC 81 MG EC tablet Take 1 tablet (81 mg total) by mouth daily. 30 tablet 0   atorvastatin (LIPITOR) 10 MG tablet TAKE 1 TABLET BY MOUTH DAILY. (Patient taking differently: Take 10 mg by mouth daily.) 90 tablet 1   citalopram (CELEXA) 20 MG tablet TAKE ONE TABLET BY MOUTH DAILY (Patient taking differently: Take 20 mg by mouth daily. TAKE ONE TABLET BY MOUTH DAILY) 90 tablet 3   Fluticasone-Umeclidin-Vilant (TRELEGY ELLIPTA)  200-62.5-25 MCG/INH AEPB Inhale 1 puff into the lungs daily. 60 each 11   furosemide (LASIX) 40 MG tablet 1 tab by mouth twice per day (Patient taking differently: Take 40 mg by mouth 2 (two) times daily.) 180 tablet 3   ipratropium-albuterol (DUONEB) 0.5-2.5 (3) MG/3ML SOLN Take 3 mLs by nebulization every 6 (six) hours as needed. 360 mL 11   levothyroxine (SYNTHROID) 75 MCG tablet TAKE ONE TABLET BY MOUTH EARLY MORNING (Patient taking differently: Take 75 mcg by mouth daily before breakfast.) 90 tablet 3   OXYGEN Inhale 3 L into the lungs continuous. 24/7     potassium chloride (KLOR-CON) 10 MEQ tablet TAKE 1 TABLET BY MOUTH 2 TIMES DAILY. (Patient taking differently: Take 10 mEq by mouth 2 (two) times  daily.) 180 tablet 3   predniSONE (DELTASONE) 5 MG tablet Take 8 tablets (40 mg) total by mouth daily for 2 days, 6 tabs (30 mg) daily for 2 days, 4 tabs (20 mg) daily for 2 days, 2 tabs (10 mg) daily for 2 days, and then resume usual dosing of 1 tablet (5 mg) daily. 60 tablet 1   predniSONE (DELTASONE) 5 MG tablet Take 1 tablet (5 mg total) by mouth daily with breakfast. 30 tablet 0   No current facility-administered medications on file prior to visit.        ROS:  All others reviewed and negative.  Objective        PE:  BP 110/68    Temp 98.7 F (37.1 C)    Ht 5\' 1"  (1.549 m)    BMI 23.58 kg/m                 Constitutional: Pt appears in NAD               HENT: Head: NCAT.                Right Ear: External ear normal.                 Left Ear: External ear normal.                Eyes: . Pupils are equal, round, and reactive to light. Conjunctivae and EOM are normal               Nose: without d/c or deformity               Neck: Neck supple. Gross normal ROM               Cardiovascular: Normal rate and regular rhythm.                 Pulmonary/Chest: Effort normal and breath sounds without rales or wheezing.                Abd:  Soft, NT, ND, + BS, no organomegaly               Neurological: Pt is alert. At baseline orientation, motor grossly intact               Skin: Skin is warm. No rashes, no other new lesions, LE edema - none               Psychiatric: Pt behavior is normal without agitation   Micro: none  Cardiac tracings I have personally interpreted today:  none  Pertinent Radiological findings (summarize): none   Lab Results  Component Value Date   WBC 7.7 07/31/2021  HGB 11.6 (L) 07/31/2021   HCT 39.5 07/31/2021   PLT 138 (L) 07/31/2021   GLUCOSE 117 (H) 08/01/2021   CHOL 195 04/19/2021   TRIG 43.0 04/19/2021   HDL 100.30 04/19/2021   LDLDIRECT 102.1 10/10/2010   LDLCALC 86 04/19/2021   ALT 22 07/30/2021   AST 34 07/30/2021   NA 137 08/01/2021   K 4.1  08/01/2021   CL 86 (L) 08/01/2021   CREATININE 0.86 08/01/2021   BUN 20 08/01/2021   CO2 44 (H) 08/01/2021   TSH 0.74 07/22/2021   INR 1.58 (H) 03/25/2016   HGBA1C 6.3 04/19/2021   MICROALBUR 4.0 (H) 04/19/2021   Assessment/Plan:  Jasmine [name] is a 76 y.o. Black or African American [2] female with  has a past medical history of ALLERGIC RHINITIS (05/01/2007), Anxiety, Arthritis, ASTHMA (05/01/2007), Cancer (HCC), CHF (congestive heart failure) ([place]), Chronic bronchitis ([place]), COPD (chronic obstructive pulmonary disease) ([place]), DEPRESSION (07/21/2008), DVT (deep venous thrombosis) ([place]) (1960s), FATIGUE (10/10/2010), HYPERTENSION (05/01/2007), HYPOTHYROIDISM (08/23/2009), LUMBAR RADICULOPATHY, RIGHT (08/25/2008), On home oxygen therapy, OSTEOPOROSIS (05/01/2007), SHOULDER PAIN, LEFT (08/23/2009), and SINUSITIS, CHRONIC (10/10/2010).  Chronic diastolic congestive heart failure (HCC) Volume stable, cont current lasix,  to f/u any worsening symptoms or concerns  Chronic respiratory failure with hypoxia (HCC) Overall stable on 4L home o2  Essential hypertension BP Readings from Last 3 Encounters:  11/28/21 110/68  08/01/21 122/71  07/22/21 110/62   Stable, pt to continue medical treatment  - diet, wt control   Steroid-dependent chronic obstructive pulmonary disease ([place]) Mild uncontrolled for predpac asd, depomedrol im 80,  then return to baseline prednisone 5 qd, to f/u any worsening symptoms or concerns  Followup: Return in about 3 months (around 02/25/2022).  [name], MD 12/01/2021 5:14 PM [facility]

## 2021-12-01 ENCOUNTER — Encounter: Payer: Self-pay | Admitting: Internal Medicine

## 2021-12-01 NOTE — Assessment & Plan Note (Signed)
BP Readings from Last 3 Encounters:  11/28/21 110/68  08/01/21 122/71  07/22/21 110/62   Stable, pt to continue medical treatment  - diet, wt control

## 2021-12-01 NOTE — Assessment & Plan Note (Addendum)
Mild uncontrolled for predpac asd, depomedrol im 80,  then return to baseline prednisone 5 qd, to f/u any worsening symptoms or concerns

## 2021-12-01 NOTE — Assessment & Plan Note (Signed)
Overall stable on 4L home o2

## 2021-12-01 NOTE — Assessment & Plan Note (Signed)
Volume stable, cont current lasix,  to f/u any worsening symptoms or concerns

## 2021-12-02 ENCOUNTER — Ambulatory Visit: Payer: TRICARE For Life (TFL) | Admitting: Adult Health

## 2021-12-03 ENCOUNTER — Ambulatory Visit: Payer: TRICARE For Life (TFL) | Admitting: *Deleted

## 2021-12-03 DIAGNOSIS — I5032 Chronic diastolic (congestive) heart failure: Secondary | ICD-10-CM

## 2021-12-03 DIAGNOSIS — J9611 Chronic respiratory failure with hypoxia: Secondary | ICD-10-CM

## 2021-12-03 NOTE — Patient Instructions (Signed)
[facility] [place], thank you for taking time to talk with me today. Please don't hesitate to contact me if I can be of assistance to you before our next scheduled telephone appointment.  Below are the goals we discussed today:  Patient Self-Care Activities: Patient [place] will: Take medications as prescribed Attend all scheduled provider appointments Call pharmacy for medication refills Call provider office for new concerns or questions I am glad you spoke with the [facility] team about resources for: Transportation needs Home Repair needs Financial and food resources, including grocery delivery services In-home care assistance with bathing/ hygiene and household chores Please follow up on the resources that have been provided to you to obtain information about their services and start using services Great job scheduling appointment with your lung (breathing) doctor   Our next scheduled telephone follow up visit/ appointment with care management team member is scheduled on:   Wednesday, December 18, 2021 at 11:30 am-- This is a PHONE [place] appointment  If you need to cancel or re-schedule our visit, please call [phone] and our care guide team will be happy to assist you.   I look forward to hearing about your progress.   [name], RN, BSN, [place] [phone]: direct office  If you are experiencing a Mental Health or [place] or need someone to talk to, please  call the Suicide and Crisis Lifeline: 988 call the [place] National Suicide Prevention Lifeline: 4052762310 or TTY: (938)563-9056 TTY 602-279-4082) to talk to a trained counselor call 1-800-273-TALK (toll free, 24 hour hotline) go to [facility] [address] [place] [phone]) call 911   The patient verbalized understanding of instructions, educational materials,  and care plan provided today and agreed to receive a mailed copy of patient instructions, educational materials, and care plan  Heart Failure Action Plan A heart failure action plan helps you understand what to do when you have symptoms of heart failure. Your action plan is a color-coded plan that lists the symptoms to watch for and indicates what actions to take. If you have symptoms in the red zone, you need medical care right away. If you have symptoms in the yellow zone, you are having problems. If you have symptoms in the green zone, you are doing well. Follow the plan that was created by you and your health care provider. Review your plan each time you visit your health care provider. Red zone These signs and symptoms mean you should get medical help right away: You have trouble breathing when resting. You have a dry cough that is getting worse. You have swelling or pain in your legs or abdomen that is getting worse. You suddenly gain more than 2-3 lb (0.9-1.4 kg) in 24 hours, or more than 5 lb (2.3 kg) in a week. This amount may be more or less depending on your condition. You have trouble staying awake or you feel confused. You have chest pain. You do not have an appetite. You pass out. You have worsening sadness or depression. If you have any of these symptoms, call your local emergency services (911 in the [place] right away. Do not drive yourself to the hospital. Yellow zone These signs and symptoms mean your condition may be getting worse and you should make some changes: You have trouble breathing when you are active, or you need to sleep with your head raised on extra pillows to  help you breathe. You have swelling in your legs or abdomen. You gain 2-3 lb (0.9-1.4 kg) in 24 hours, or 5 lb (2.3 kg) in a week. This amount may be more or less depending on your condition. You get tired easily. You have trouble sleeping. You have a dry cough. If you have any of these  symptoms: Contact your health care provider within the next day. Your health care provider may adjust your medicines. Green zone These signs mean you are doing well and can continue what you are doing: You do not have shortness of breath. You have very little swelling or no new swelling. Your weight is stable (no gain or loss). You have a normal activity level. You do not have chest pain or any other new symptoms. Follow these instructions at home: Take over-the-counter and prescription medicines only as told by your health care provider. Weigh yourself daily. Your target weight is __________ lb (__________ kg). Call your health care provider if you gain more than __________ lb (__________ kg) in 24 hours, or more than __________ lb (__________ kg) in a week. Health care provider name: _____________________________________________________ Health care provider phone number: _____________________________________________________ Eat a heart-healthy diet. Work with a diet and nutrition specialist (dietitian) to create an eating plan that is best for you. Keep all follow-up visits. This is important. Where to find more information American Heart Association: www.heart.org Summary A heart failure action plan helps you understand what to do when you have symptoms of heart failure. Follow the action plan that was created by you and your health care provider. Get help right away if you have any symptoms in the red zone. This information is not intended to replace advice given to you by your health care provider. Make sure you discuss any questions you have with your health care provider. Document Revised: 05/21/2020 Document Reviewed: 05/21/2020 Elsevier Patient Education  2022 [employer].

## 2021-12-03 NOTE — Chronic Care Management (AMB) (Signed)
Chronic Care Management   CCM RN Visit Note  12/03/2021 Name: Jasmine [name] MRN: [id number] DOB: [birth year]  Subjective: Jasmine [name] is a 76 y.o. year old female who is a primary care patient of Biagio Borg, MD. The care management team was consulted for assistance with disease management and care coordination needs.    Engaged with patient by telephone for follow up visit in response to provider referral for case management and/or care coordination services.   Consent to Services:  The patient was given information about Chronic Care Management services, agreed to services, and gave verbal consent prior to initiation of services.  Please see initial visit note for detailed documentation.  Patient agreed to services and verbal consent obtained.   Assessment: Review of patient past medical history, allergies, medications, health status, including review of consultants reports, laboratory and other test data, was performed as part of comprehensive evaluation and provision of chronic care management services.  CCM Care Plan  Allergies  Allergen Reactions   Alendronate Sodium Other (See Comments)    Patient does not remember this reaction   Outpatient Encounter Medications as of 12/03/2021  Medication Sig   albuterol (VENTOLIN HFA) 108 (90 Base) MCG/ACT inhaler INHALE 1 TO 2 PUFFS INTO THE LUNGS EVERY 6 HOURS AS NEEDED FOR WHEEZING OR SHORTNESS OF BREATH   aspirin EC 81 MG EC tablet Take 1 tablet (81 mg total) by mouth daily.   atorvastatin (LIPITOR) 10 MG tablet TAKE 1 TABLET BY MOUTH DAILY. (Patient taking differently: Take 10 mg by mouth daily.)   citalopram (CELEXA) 20 MG tablet TAKE ONE TABLET BY MOUTH DAILY (Patient taking differently: Take 20 mg by mouth daily. TAKE ONE TABLET BY MOUTH DAILY)   Fluticasone-Umeclidin-Vilant (TRELEGY ELLIPTA) 200-62.5-25 MCG/INH AEPB Inhale 1 puff into the lungs daily.   furosemide (LASIX) 40 MG tablet 1 tab by mouth twice per day (Patient  taking differently: Take 40 mg by mouth 2 (two) times daily.)   ipratropium-albuterol (DUONEB) 0.5-2.5 (3) MG/3ML SOLN Take 3 mLs by nebulization every 6 (six) hours as needed.   levothyroxine (SYNTHROID) 75 MCG tablet TAKE ONE TABLET BY MOUTH EARLY MORNING (Patient taking differently: Take 75 mcg by mouth daily before breakfast.)   OXYGEN Inhale 3 L into the lungs continuous. 24/7   potassium chloride (KLOR-CON) 10 MEQ tablet TAKE 1 TABLET BY MOUTH 2 TIMES DAILY. (Patient taking differently: Take 10 mEq by mouth 2 (two) times daily.)   predniSONE (DELTASONE) 10 MG tablet 3 tabs by mouth per day for 3 days,2tabs per day for 3 days,1tab per day for 3 days   predniSONE (DELTASONE) 5 MG tablet Take 8 tablets (40 mg) total by mouth daily for 2 days, 6 tabs (30 mg) daily for 2 days, 4 tabs (20 mg) daily for 2 days, 2 tabs (10 mg) daily for 2 days, and then resume usual dosing of 1 tablet (5 mg) daily.   predniSONE (DELTASONE) 5 MG tablet Take 1 tablet (5 mg total) by mouth daily with breakfast.   No facility-administered encounter medications on file as of 12/03/2021.   Patient Active Problem List   Diagnosis Date Noted   COPD exacerbation ([place]) 07/30/2021   Diabetes mellitus due to underlying condition without complication, without long-term current use of insulin ([place]) 07/22/2021   Steroid-dependent chronic obstructive pulmonary disease ([place]) 07/22/2021   Postablative hypothyroidism 07/22/2021   Encounter for well adult exam with abnormal findings 04/20/2021   Malnutrition of moderate degree 08/16/2019   Respiratory  acidosis 03/07/2019   Goals of care, counseling/discussion 09/20/2018   Edema due to congestive heart failure ([place]) 07/27/2018   Palliative care by specialist    Shortness of breath    Chronic low back pain 04/30/2018   COPD with acute exacerbation ([place])    Hypothyroidism    Anxiety    Respiratory failure with hypercapnia ([place]) 03/24/2018   Hypercarbia 03/22/2018   Polysubstance  abuse ([place]) 01/26/2018   Aspiration into airway 06/03/2017   Rash 07/12/2016   Bilateral hearing loss 06/05/2016   Hyperglycemia 05/21/2016   Abnormal LFTs 03/24/2016   Solitary pulmonary nodule 01/09/2016   Chronic respiratory failure with hypoxia ([place])    On home oxygen therapy    Chronic diastolic congestive heart failure ([place])    Bacteremia [facility] 12/27/2015   Former smoker 12/25/2015   Hypothyroidism following radioiodine therapy 09/26/2014   Protein-calorie malnutrition, severe ([place]) 06/23/2014   Cor pulmonale ([place]) 01/12/2013   COPD (chronic obstructive pulmonary disease) ([place]) 11/29/2011   Macrocytosis without anemia 11/09/2011   Palliative care encounter 04/09/2011   SINUSITIS, CHRONIC 10/10/2010   Fatigue 10/10/2010   Depression with anxiety 07/21/2008   Essential hypertension 05/01/2007   ALLERGIC RHINITIS 05/01/2007   Asthma 05/01/2007   OSTEOPOROSIS 05/01/2007   Conditions to be addressed/monitored:  CHF and COPD  Care Plan : RN Care Manager Plan of Care  Updates made by Knox Royalty, RN since 12/03/2021 12:00 AM     Problem: Chronic Disease Management Needs   Priority: High     Long-Range Goal: Ongoing adherence to established plan of care for long term chronic disease management   Start Date: 11/21/2021  Expected End Date: 11/21/2022  Priority: High  Note:   Current Barriers:  Chronic Disease Management support and education needs related to CHF and COPD Frequent falls- patient reports 2-plus recent falls, both without serious injury; reports uses walker "all the time" Fragile state of health, multiple progressing severe chronic health conditions: on continuous home O2 at 3 L/min Most recent hospitalization October 11-13, 2022 for acute on chronic respiratory failure/ CHF exacerbation Inconsistent adherence to attending provider appointments Cancelled post-hospital discharge provider office visits to pulmonary provider 10/18/21; no-show to PCP  10/07/22 Was previously active with outpatient palliative care team; palliative care case closure 05/16/21: they were unable to maintain contact with patient for ongoing palliative care services/ care SDOH concerns as follows: 12/03/21: confirmed [employer] has contacted/ spoken with patient and provided resources as requested Transportation: reports does not drive self; can usually depend on roommate (who happens to be her ex-husband) and/ or extended family ("nieces and nephews"), however, she reports, "they are not doing so good lately either;" she is uncertain if she will be able to rely on these family members in the future Finances and food acquisition, in light of current economy: reports does not qualify for food stamps or medicaid, "they say I make too much money" Home in need of repair: reports "front porch steps needs sturdy railing, and the steps need repair;" confirms she does have a ramp that she is able to use off the back entry Need for assistance with hygiene/ bathing, household management/ chores, shopping, and errands  RNCM Clinical Goal(s):  Patient will demonstrate improved health management independence as evidenced by adherence to plan of care for COPD; CHF        through collaboration with RN Care manager, provider, and care team.   Interventions: 1:1 collaboration with primary care provider regarding development  and update of comprehensive plan of care as evidenced by provider attestation and co-signature Inter-disciplinary care team collaboration (see longitudinal plan of care) Evaluation of current treatment plan related to  self management and patient's adherence to plan as established by provider 12/03/21: Initial assessment completed SDOH  needs- confirmed [facility] has contacted patient and provided resources Reviewed with patient recent PCP office visit 11/28/21: patient reports she "ran out of oxygen" during visit and prior to  leaving visit to return home, she had to call the "ambulance squad;" sounds as if patient called from parking lot-- states she was provided a portable oxygen tank to go home with and that someone "from the rescue squad" subsequently came by her home to retrieve the loaner tank they had provided her Patient confirms she picked up/ is taking prednisone as instructed at PCP office visit  Heart Failure/ COPD Interventions:  (Status: 12/03/21: Goal on Track (progressing): YES.)  Long Term Goal  Wt Readings from Last 3 Encounters:  08/01/21 124 lb 12.5 oz (56.6 kg)  07/22/21 130 lb (59 kg)  04/19/21 130 lb (59 kg)  Provided education on low sodium diet Reviewed role of diuretics in prevention of fluid overload and management of heart failure Discussed the importance of keeping all appointments with provider Provided patient with basic written and verbal COPD education on self care/management/and exacerbation prevention Advised patient to track and manage COPD triggers Advised patient to self assesses COPD action plan zone and make appointment with provider if in the yellow zone for 48 hours without improvement Discussed the importance of adequate rest and management of fatigue with COPD  On 11/21/21, confirmed patient received and is using nebulizer medication, per pulmonary provider order 11/05/21- however, today, she is telling me about "a machine" she has and has been using "a little bit" with a mask on it-- she states it is NOT CPAP, but her description of this machine is most consistent with CPAP-- states she plans to take machine to upcoming scheduled office visit with pulmonary provider on 12/05/21 Reports breathing at baseline today: states she continues using rescue inhaler daily and has been using a "fan" as well, which she reports "helps make breathing easier" Confirmed patient continues using home O2 as prescribed: continuous; encouraged her to ensure that she has plenty of oxygen to use to get  her through upcoming pulmonary provider office visit, given what she told me happened after PCP office visit Reports ongoing shortness of breath as baseline when "just walking around my house;" confirms she uses continuous O2 at home, 2-3 L/min Reports baseline swelling of lower extremities without change: confirmed patient continues to endorse taking diuretic as prescribed  Patient Goals/Self-Care Activities: As evidenced by review of EHR, collaboration with care team, and patient reporting during CCM RN CM outreach,   Patient Miu will: Take medications as prescribed Attend all scheduled provider appointments Call pharmacy for medication refills Call provider office for new concerns or questions I am glad you spoke with the [facility] team about resources for: Transportation needs Home Repair needs Financial and food resources, including grocery delivery services In-home care assistance with bathing/ hygiene and household chores Please follow up on the resources that have been provided to you to obtain information about their services and start using services Great job scheduling appointment with your lung (breathing) doctor      Plan: Telephone follow up appointment with care management team member scheduled for:  Wednesday, December 18, 2021 at 11:30  am The patient has been provided with contact information for the care management team and has been advised to call with any health related questions or concerns  [name], RN, BSN, [place] [phone]: direct office

## 2021-12-05 ENCOUNTER — Ambulatory Visit (INDEPENDENT_AMBULATORY_CARE_PROVIDER_SITE_OTHER): Payer: Medicare PPO | Admitting: Adult Health

## 2021-12-05 ENCOUNTER — Encounter: Payer: Self-pay | Admitting: Adult Health

## 2021-12-05 ENCOUNTER — Other Ambulatory Visit: Payer: Self-pay

## 2021-12-05 DIAGNOSIS — J9611 Chronic respiratory failure with hypoxia: Secondary | ICD-10-CM | POA: Diagnosis not present

## 2021-12-05 DIAGNOSIS — E43 Unspecified severe protein-calorie malnutrition: Secondary | ICD-10-CM

## 2021-12-05 DIAGNOSIS — J432 Centrilobular emphysema: Secondary | ICD-10-CM | POA: Diagnosis not present

## 2021-12-05 DIAGNOSIS — R5381 Other malaise: Secondary | ICD-10-CM

## 2021-12-05 DIAGNOSIS — I5032 Chronic diastolic (congestive) heart failure: Secondary | ICD-10-CM

## 2021-12-05 NOTE — Progress Notes (Signed)
@Patient  ID: Jasmine [name], female    DOB: March 27, 1946, 76 y.o.   MRN: [id number]  Chief Complaint  Patient presents with   Follow-up    Referring provider: Biagio Borg, MD  HPI: 76 year old female former smoker followed for very severe COPD, chronic hypoxic and hypercarbic respiratory failure on oxygen, lung nodules Managed on an aggressive regimen with nocturnal trilogy device and chronic steroids with prednisone 10 mg daily Maintained on oxygen since March 2014 on 3 L at rest and 4 L with activity History of polysubstance abuse with cocaine  TEST/EVENTS :  CT chest 10/2011 >> showed Moderate centrilobular emphysema. Small bilateral pleural effusions with dependent atelectasis   CT chest 07/2016-resolving right upper lobe consolidation, spiculated subcentimeter nodules that have been noted before with low-grade PET hypermetabolism CT chest February 2019 stable right upper lobe scarring, stable bilateral pulmonary nodules, emphysema    CT chest  11/2018>> stable nodules, unchanged bandlike scarring in the inferior right upper lobe stable  Stable small pulmonary nodules, likely benign sequelae of prior infection. No further routine CT follow-up is required for these Nodules.    PFT  >FEV1 at 0.47 L( 30%), ratio 39 , No BD response (<200cc)  12/05/2021 Follow up ; COPD , O2 RF  Patient returns for 1 year follow-up.  Patient says over the last year she continues to have ongoing shortness of breath .  She gets winded with minimal activities.  She remains on oxygen  4 L//m .  She was previously on trilogy device at nighttime but has stopped wearing this. Turned it back into DME. Says she was told by her doctors that she did not need this anymore and it might be fatal to her.  Patient education was provided. She remains on Trelegy inhaler daily.  Uses albuterol inhaler and duoneb 2-3 times a day Drinking some beers recently . No longer using cocaine.  Ex husband lives at home with her.  Is her caregiver.  Does light chores at home but has to rest often. She does feel that her activity tolerance is decreasing.  She feels like she is losing strength.  Would like a referral for physical therapy to help her with balance and strength.   Was hospitalized October 2022 for COPD exacerbation with congestive heart failure decompensation.    Allergies  Allergen Reactions   Alendronate Sodium Other (See Comments)    Patient does not remember this reaction    Immunization History  Administered Date(s) Administered   Fluad Quad(high Dose 65+) 08/30/2019, 08/14/2020, 08/01/2021   Influenza Split 07/10/2011, 08/18/2012   Influenza Whole 07/21/2008, 08/23/2009, 10/10/2010   Influenza, High Dose Seasonal PF 07/10/2016, 09/03/2017   Influenza,inj,Quad PF,6+ Mos 08/12/2013, 07/10/2014, 07/08/2018   PFIZER(Purple Top)SARS-COV-2 Vaccination 12/24/2019, 01/14/2020, 11/22/2020   Pneumococcal Conjugate-13 11/30/2014   Pneumococcal Polysaccharide-23 07/21/2007, 01/12/2013, 06/16/2018   Td 08/23/2009   Zoster, Live 07/20/2006    Past Medical History:  Diagnosis Date   ALLERGIC RHINITIS 05/01/2007   Anxiety    Arthritis    "hands" (12/28/2015)   ASTHMA 05/01/2007   "since I was a child"   Cancer ([place])    LUNG   CHF (congestive heart failure) ([place])    Chronic bronchitis ([place])    COPD (chronic obstructive pulmonary disease) ([place])    DEPRESSION 07/21/2008   DVT (deep venous thrombosis) ([place]) 1960s   LLE   FATIGUE 10/10/2010   HYPERTENSION 05/01/2007   HYPOTHYROIDISM 08/23/2009   s/p RAI   LUMBAR RADICULOPATHY, RIGHT 08/25/2008  On home oxygen therapy    "3-4L; qd; all the time" (12/28/2015)   OSTEOPOROSIS 05/01/2007   SHOULDER PAIN, LEFT 08/23/2009   SINUSITIS, CHRONIC 10/10/2010    Tobacco History: Social History   Tobacco Use  Smoking Status Former   Packs/day: 1.00   Years: 52.00   Pack years: 52.00   Types: Cigarettes   Start date: 1965   Quit date: 07/20/2016   Years  since quitting: 5.3  Smokeless Tobacco Never   Counseling given: Not Answered   Outpatient Medications Prior to Visit  Medication Sig Dispense Refill   albuterol (VENTOLIN HFA) 108 (90 Base) MCG/ACT inhaler INHALE 1 TO 2 PUFFS INTO THE LUNGS EVERY 6 HOURS AS NEEDED FOR WHEEZING OR SHORTNESS OF BREATH 18 g 4   aspirin EC 81 MG EC tablet Take 1 tablet (81 mg total) by mouth daily. 30 tablet 0   atorvastatin (LIPITOR) 10 MG tablet TAKE 1 TABLET BY MOUTH DAILY. (Patient taking differently: Take 10 mg by mouth daily.) 90 tablet 1   citalopram (CELEXA) 20 MG tablet TAKE ONE TABLET BY MOUTH DAILY (Patient taking differently: Take 20 mg by mouth daily. TAKE ONE TABLET BY MOUTH DAILY) 90 tablet 3   Fluticasone-Umeclidin-Vilant (TRELEGY ELLIPTA) 200-62.5-25 MCG/INH AEPB Inhale 1 puff into the lungs daily. 60 each 11   furosemide (LASIX) 40 MG tablet 1 tab by mouth twice per day (Patient taking differently: Take 40 mg by mouth 2 (two) times daily.) 180 tablet 3   guaiFENesin (MUCINEX CHEST CONGESTION CHILD) 100 MG/5ML liquid Take 5 mLs by mouth every 4 (four) hours as needed for cough or to loosen phlegm. Mucinex chest congestion (liquid)     ipratropium-albuterol (DUONEB) 0.5-2.5 (3) MG/3ML SOLN Take 3 mLs by nebulization every 6 (six) hours as needed. 360 mL 11   levothyroxine (SYNTHROID) 75 MCG tablet TAKE ONE TABLET BY MOUTH EARLY MORNING (Patient taking differently: Take 75 mcg by mouth daily before breakfast.) 90 tablet 3   OXYGEN Inhale 3 L into the lungs continuous. 24/7     potassium chloride (KLOR-CON) 10 MEQ tablet TAKE 1 TABLET BY MOUTH 2 TIMES DAILY. (Patient taking differently: Take 10 mEq by mouth 2 (two) times daily.) 180 tablet 3   predniSONE (DELTASONE) 10 MG tablet 3 tabs by mouth per day for 3 days,2tabs per day for 3 days,1tab per day for 3 days 18 tablet 0   predniSONE (DELTASONE) 5 MG tablet Take 8 tablets (40 mg) total by mouth daily for 2 days, 6 tabs (30 mg) daily for 2 days, 4  tabs (20 mg) daily for 2 days, 2 tabs (10 mg) daily for 2 days, and then resume usual dosing of 1 tablet (5 mg) daily. (Patient not taking: Reported on 12/05/2021) 60 tablet 1   predniSONE (DELTASONE) 5 MG tablet Take 1 tablet (5 mg total) by mouth daily with breakfast. (Patient not taking: Reported on 12/05/2021) 30 tablet 0   No facility-administered medications prior to visit.     Review of Systems:   Constitutional:   No  weight loss, night sweats,  Fevers, chills, + fatigue, or  lassitude.  HEENT:   No headaches,  Difficulty swallowing,  Tooth/dental problems, or  Sore throat,                No sneezing, itching, ear ache, nasal congestion, post nasal drip,   CV:  No chest pain,  Orthopnea, PND, swelling in lower extremities, anasarca, dizziness, palpitations, syncope.   GI  No heartburn,  indigestion, abdominal pain, nausea, vomiting, diarrhea, change in bowel habits, loss of appetite, bloody stools.   Resp:  No chest wall deformity  Skin: no rash or lesions.  GU: no dysuria, change in color of urine, no urgency or frequency.  No flank pain, no hematuria   MS:  No joint pain or swelling.  No decreased range of motion.  No back pain.    Physical Exam  BP 110/60 (BP Location: Right Arm, Patient Position: Sitting, Cuff Size: Normal)    Pulse 82    Temp 98.2 F (36.8 C) (Oral)    Ht 5' (1.524 m)    Wt 120 lb (54.4 kg)    SpO2 96%    BMI 23.44 kg/m   GEN: A/Ox3; pleasant , NAD, chronically ill-appearing in wheelchair on oxygen   HEENT:  [place]/AT,  EACs-clear, TMs-wnl, NOSE-clear, THROAT-clear, no lesions, no postnasal drip or exudate noted.   NECK:  Supple w/ fair ROM; no JVD; normal carotid impulses w/o bruits; no thyromegaly or nodules palpated; no lymphadenopathy.    RESP  Clear  P & A; w/o, wheezes/ rales/ or rhonchi. no accessory muscle use, no dullness to percussion  CARD:  RRR, no m/r/g, 1+ peripheral edema, pulses intact, no cyanosis or clubbing.  GI:   Soft & nt; nml  bowel sounds; no organomegaly or masses detected.   Musco: Warm bil, no deformities or joint swelling noted.   Neuro: alert, no focal deficits noted.    Skin: Warm, no lesions or rashes        BNP    Imaging:   No flowsheet data found.  No results found for: NITRICOXIDE      Assessment & Plan:   COPD (chronic obstructive pulmonary disease) ([place]) Very severe COPD with significant symptom burden.  Patient is on maximum triple therapy maintenance regimen along with rescue medications with DuoNeb and albuterol.  She is also on chronic steroids.  She was previously on noninvasive/trilogy device at bedtime to help with chronic hypercarbia and frequent exacerbations.  Unfortunately patient turned this back in. Did discuss palliative care however patient declines.  We will continue on current regimen.  Plan  Patient Instructions  Remain on Lasix 40mg  Twice daily   Continue on Trelegy 1 puff daily.  May use Duoneb nebs every 6hr As needed  Wheezing /shortness of breath .  Continue on Oxygen 3l/m rest and 4l/m with activity .  Continue on Prednisone 10mg  daily  Order for PT evaluation and treatment.  Follow up Dr. Elsworth Soho  In 3-4 months and As needed   Please contact office for sooner follow up if symptoms do not improve or worsen or seek emergency care                Protein-calorie malnutrition, severe ([place]) High-protein diet  Chronic respiratory failure with hypoxia ([facility]) Chronic hypercarbia and hypoxemia.  Continue on oxygen to maintain O2 saturations greater than 88 to 90%. Patient turned her trilogy device back in.  Patient education was provided.  Plan  Patient Instructions  Remain on Lasix 40mg  Twice daily   Continue on Trelegy 1 puff daily.  May use Duoneb nebs every 6hr As needed  Wheezing /shortness of breath .  Continue on Oxygen 3l/m rest and 4l/m with activity .  Continue on Prednisone 10mg  daily  Order for PT evaluation and treatment.   Follow up Dr. Elsworth Soho  In 3-4 months and As needed   Please contact office for sooner follow up if symptoms  do not improve or worsen or seek emergency care                Chronic diastolic congestive heart failure ([facility]) Chronic diastolic heart failure.  Appears to be euvolemic without evidence of acute volume overload.  Continue on Lasix as prescribed  Plan  Patient Instructions  Remain on Lasix 40mg  Twice daily   Continue on Trelegy 1 puff daily.  May use Duoneb nebs every 6hr As needed  Wheezing /shortness of breath .  Continue on Oxygen 3l/m rest and 4l/m with activity .  Continue on Prednisone 10mg  daily  Order for PT evaluation and treatment.  Follow up Dr. Elsworth Soho  In 3-4 months and As needed   Please contact office for sooner follow up if symptoms do not improve or worsen or seek emergency care                  Physical deconditioning Patient has significant balance issues.  She has high symptom burden from COPD and severe protein calorie malnutrition.  Needs in-home physical therapy evaluation and treatment options to help with strengthening exercise and balance gait     Rexene Edison, NP 12/05/2021

## 2021-12-05 NOTE — Assessment & Plan Note (Signed)
Chronic hypercarbia and hypoxemia.  Continue on oxygen to maintain O2 saturations greater than 88 to 90%. Patient turned her trilogy device back in.  Patient education was provided.  Plan  Patient Instructions  Remain on Lasix 40mg  Twice daily   Continue on Trelegy 1 puff daily.  May use Duoneb nebs every 6hr As needed  Wheezing /shortness of breath .  Continue on Oxygen 3l/m rest and 4l/m with activity .  Continue on Prednisone 10mg  daily  Order for PT evaluation and treatment.  Follow up Dr. Elsworth Soho  In 3-4 months and As needed   Please contact office for sooner follow up if symptoms do not improve or worsen or seek emergency care

## 2021-12-05 NOTE — Assessment & Plan Note (Signed)
Patient has significant balance issues.  She has high symptom burden from COPD and severe protein calorie malnutrition.  Needs in-home physical therapy evaluation and treatment options to help with strengthening exercise and balance gait

## 2021-12-05 NOTE — Assessment & Plan Note (Signed)
Chronic diastolic heart failure.  Appears to be euvolemic without evidence of acute volume overload.  Continue on Lasix as prescribed  Plan  Patient Instructions  Remain on Lasix 40mg  Twice daily   Continue on Trelegy 1 puff daily.  May use Duoneb nebs every 6hr As needed  Wheezing /shortness of breath .  Continue on Oxygen 3l/m rest and 4l/m with activity .  Continue on Prednisone 10mg  daily  Order for PT evaluation and treatment.  Follow up Dr. Elsworth Soho  In 3-4 months and As needed   Please contact office for sooner follow up if symptoms do not improve or worsen or seek emergency care

## 2021-12-05 NOTE — Patient Instructions (Addendum)
Remain on Lasix 40mg  Twice daily   Continue on Trelegy 1 puff daily.  May use Duoneb nebs every 6hr As needed  Wheezing /shortness of breath .  Continue on Oxygen 3l/m rest and 4l/m with activity .  Continue on Prednisone 10mg  daily  Order for PT evaluation and treatment.  Follow up Dr. Elsworth Soho  In 3-4 months and As needed   Please contact office for sooner follow up if symptoms do not improve or worsen or seek emergency care

## 2021-12-05 NOTE — Assessment & Plan Note (Signed)
Very severe COPD with significant symptom burden.  Patient is on maximum triple therapy maintenance regimen along with rescue medications with DuoNeb and albuterol.  She is also on chronic steroids.  She was previously on noninvasive/trilogy device at bedtime to help with chronic hypercarbia and frequent exacerbations.  Unfortunately patient turned this back in. Did discuss palliative care however patient declines.  We will continue on current regimen.  Plan  Patient Instructions  Remain on Lasix 40mg  Twice daily   Continue on Trelegy 1 puff daily.  May use Duoneb nebs every 6hr As needed  Wheezing /shortness of breath .  Continue on Oxygen 3l/m rest and 4l/m with activity .  Continue on Prednisone 10mg  daily  Order for PT evaluation and treatment.  Follow up Dr. Elsworth Soho  In 3-4 months and As needed   Please contact office for sooner follow up if symptoms do not improve or worsen or seek emergency care

## 2021-12-05 NOTE — Assessment & Plan Note (Signed)
High-protein diet 

## 2021-12-09 ENCOUNTER — Telehealth: Payer: Self-pay | Admitting: *Deleted

## 2021-12-09 NOTE — Telephone Encounter (Signed)
° °  Telephone encounter was:  Unsuccessful.  12/09/2021 Name: Jasmine [name] MRN: [id number] DOB: 11-Aug-1946  Unsuccessful outbound call made today to assist with:  Transportation and also changing insurance   Outreach Attempt:  2nd Attempt  no answer no voicemail left   [place], Care Management  [phone] [address] , [place] [address] Email : [email or web address] [email or web address]

## 2021-12-12 NOTE — Telephone Encounter (Signed)
Nothing noted in message. Will close encounter.  

## 2021-12-13 ENCOUNTER — Telehealth: Payer: Self-pay | Admitting: *Deleted

## 2021-12-13 NOTE — Telephone Encounter (Signed)
° °  Telephone encounter was:  Successful.  12/13/2021 Name: Jasmine [name] MRN: [id number] DOB: [birth year]  Jasmine [name] is a 76 y.o. year old female who is a primary care patient of Jenny Reichmann, Hunt Oris, MD . The community resource team was consulted for assistance with Transportation Needs  and insurance change   Care guide performed the following interventions: Completed the referral patient has insurance changed as she requested and she will now have a transportation option through this medicare plan and she is aware she needs to call 72 hrs in advance .  Follow Up Plan:  No further follow up planned at this time. The patient has been provided with needed resources.  [place], Care Management  [phone] [address] , [place] [address] Email : [email or web address] [email or web address]

## 2021-12-17 DIAGNOSIS — I5032 Chronic diastolic (congestive) heart failure: Secondary | ICD-10-CM

## 2021-12-18 ENCOUNTER — Ambulatory Visit (INDEPENDENT_AMBULATORY_CARE_PROVIDER_SITE_OTHER): Payer: Medicare HMO | Admitting: *Deleted

## 2021-12-18 DIAGNOSIS — J9611 Chronic respiratory failure with hypoxia: Secondary | ICD-10-CM

## 2021-12-18 DIAGNOSIS — I5032 Chronic diastolic (congestive) heart failure: Secondary | ICD-10-CM

## 2021-12-18 NOTE — Patient Instructions (Signed)
Visit Information ? ?Jasmine [name], thank you for taking time to talk with me today. Please don't hesitate to contact me if I can be of assistance to you before our next scheduled telephone appointment ? ?Below are the goals we discussed today:  ?Patient Self-Care Activities: ?Patient Jasmine [name] will: ?Take medications as prescribed ?Attend all scheduled provider appointments ?Call pharmacy for medication refills ?Call provider office for new concerns or questions ?Continue to use your oxygen, and your inhalers/ nebulizer as prescribed ?Keep up the great work preventing recent falls- continue using your walker as needed ?Please make a follow up appointment with [name]: he wanted to see you again after your last appointment anytime near Feb 25, 2022 (3 months after your last office visit with him on 11/28/21) ? ?Our next scheduled telephone follow up visit/ appointment is scheduled on:   Tuesday, Mar 11, 2022 at 11:30 am- This is a PHONE [place] appointment ? ?If you need to cancel or re-schedule our visit, please call [phone] and our care guide team will be happy to assist you. ?  ?I look forward to hearing about your progress. ?  ?[name], RN, BSN, CCRN Alumnus ?[place] ?([phone]: direct office ? ?If you are experiencing a Mental Health or [place] or need someone to talk to, please call  ?the Suicide and Crisis Lifeline: 988 ?call the [place] National Suicide Prevention Lifeline: 559-826-6410 or TTY: 6696865715 TTY (646)511-7187) to talk to a trained counselor ?call 1-800-273-TALK (toll free, 24 hour hotline) ?go to [facility] [address] [place] [phone]) ?call 911  ? ?The patient verbalized understanding of instructions, educational materials, and care plan provided today and agreed to receive a mailed copy of patient instructions, educational materials, and care plan ? ?Understanding Your  Risk for Falls ?Each year, millions of people have serious injuries from falls. It is important to understand your risk for falling. Talk with your health care provider about your risk and what you can do to lower it. There are actions you can take at home to lower your risk and prevent falls. ?If you do have a serious fall, make sure to tell your health care provider. Falling once raises your risk of falling again. ?How can falls affect me? ?Serious injuries from falls are common. These include: ?Broken bones, such as hip fractures. ?Head injuries, such as traumatic brain injuries (TBI) or concussion. ?A fear of falling can cause you to avoid activities and stay at home. This can make your muscles weaker and actually raise your risk for a fall. ?What can increase my risk? ?There are a number of risk factors that increase your risk for falling. The more risk factors you have, the higher your risk of falling. Serious injuries from a fall happen most often to people older than age 19. Children and young adults ages 72-29 are also at higher risk. ?Common risk factors include: ?Weakness in the lower body. ?Lack (deficiency) of vitamin D. ?Being generally weak or confused due to long-term (chronic) illness. ?Dizziness or balance problems. ?Poor vision. ?Medicines that cause dizziness or drowsiness. These can include medicines for your blood pressure, heart, anxiety, insomnia, or edema, as well as pain medicines and muscle relaxants. ?Other risk factors include: ?Drinking alcohol. ?Having had a fall in the past. ?Having depression. ?Having foot pain or wearing improper footwear. ?Working at a dangerous job. ?Having any of the following in your home: ?Tripping hazards, such as  floor clutter or loose rugs. ?Poor lighting. ?Pets. ?Having dementia or memory loss. ?What actions can I take to lower my risk of falling? ?  ?Physical activity ?Maintain physical fitness. Do strength and balance exercises. Consider taking a regular  class to build strength and balance. Yoga and tai chi are good options. ?Vision ?Have your eyes checked every year and your vision prescription updated as needed. ?Walking aids and footwear ?Wear nonskid shoes. Do not wear high heels. ?Do not walk around the house in socks or slippers. ?Use a cane or walker as told by your health care provider. ?Home safety ?Attach secure railings on both sides of your stairs. ?Install grab bars for your tub, shower, and toilet. Use a bath mat in your tub or shower. ?Use good lighting in all rooms. Keep a flashlight near your bed. ?Make sure there is a clear path from your bed to the bathroom. Use night-lights. ?Do not use throw rugs. Make sure all carpeting is taped or tacked down securely. ?Remove all clutter from walkways and stairways, including extension cords. ?Repair uneven or broken steps. ?Avoid walking on icy or slippery surfaces. Walk on the grass instead of on icy or slick sidewalks. Use ice melt to get rid of ice on walkways. ?Use a cordless phone. ?Questions to ask your health care provider ?Can you help me check my risk for a fall? ?Do any of my medicines make me more likely to fall? ?Should I take a vitamin D supplement? ?What exercises can I do to improve my strength and balance? ?Should I make an appointment to have my vision checked? ?Do I need a bone density test to check for weak bones or osteoporosis? ?Would it help to use a cane or a walker? ?Where to find more information ?Centers for Disease Control and Prevention, STEAD[place]: http://www.wolf.info/ ?Community-Based Fall Prevention Programs: http://www.wolf.info/ ?[employer] on Aging: http://kim-miller.com/ ?Contact a health care provider if: ?You fall at home. ?You are afraid of falling at home. ?You feel weak, drowsy, or dizzy. ?Summary ?Serious injuries from a fall happen most often to people older than age 52. Children and young adults ages 84-29 are also at higher risk. ?Talk with your health care provider about your risks for  falling and how to lower those risks. ?Taking certain precautions at home can lower your risk for falling. ?If you fall, always tell your health care provider. ?This information is not intended to replace advice given to you by your health care provider. Make sure you discuss any questions you have with your health care provider. ?Document Revised: 05/09/2020 Document Reviewed: 05/09/2020 ?Elsevier Patient Education ? 2022 [place]. ?  ? ? ?

## 2021-12-18 NOTE — Chronic Care Management (AMB) (Signed)
Chronic Care Management   CCM RN Visit Note  12/18/2021 Name: Jasmine [name] MRN: [id number] DOB: 1946-01-22  Subjective: Jasmine [name] is a 76 y.o. year old female who is a primary care patient of Biagio Borg, MD. The care management team was consulted for assistance with disease management and care coordination needs.    Engaged with patient by telephone for follow up visit in response to provider referral for case management and/or care coordination services.   Consent to Services:  The patient was given information about Chronic Care Management services, agreed to services, and gave verbal consent prior to initiation of services.  Please see initial visit note for detailed documentation.  Patient agreed to services and verbal consent obtained.   Assessment: Review of patient past medical history, allergies, medications, health status, including review of consultants reports, laboratory and other test data, was performed as part of comprehensive evaluation and provision of chronic care management services.  CCM Care Plan  Allergies  Allergen Reactions   Alendronate Sodium Other (See Comments)    Patient does not remember this reaction   Outpatient Encounter Medications as of 12/18/2021  Medication Sig   albuterol (VENTOLIN HFA) 108 (90 Base) MCG/ACT inhaler INHALE 1 TO 2 PUFFS INTO THE LUNGS EVERY 6 HOURS AS NEEDED FOR WHEEZING OR SHORTNESS OF BREATH   aspirin EC 81 MG EC tablet Take 1 tablet (81 mg total) by mouth daily.   atorvastatin (LIPITOR) 10 MG tablet TAKE 1 TABLET BY MOUTH DAILY. (Patient taking differently: Take 10 mg by mouth daily.)   citalopram (CELEXA) 20 MG tablet TAKE ONE TABLET BY MOUTH DAILY (Patient taking differently: Take 20 mg by mouth daily. TAKE ONE TABLET BY MOUTH DAILY)   Fluticasone-Umeclidin-Vilant (TRELEGY ELLIPTA) 200-62.5-25 MCG/INH AEPB Inhale 1 puff into the lungs daily.   furosemide (LASIX) 40 MG tablet 1 tab by mouth twice per day (Patient taking  differently: Take 40 mg by mouth 2 (two) times daily.)   guaiFENesin (MUCINEX CHEST CONGESTION CHILD) 100 MG/5ML liquid Take 5 mLs by mouth every 4 (four) hours as needed for cough or to loosen phlegm. Mucinex chest congestion (liquid)   ipratropium-albuterol (DUONEB) 0.5-2.5 (3) MG/3ML SOLN Take 3 mLs by nebulization every 6 (six) hours as needed.   levothyroxine (SYNTHROID) 75 MCG tablet TAKE ONE TABLET BY MOUTH EARLY MORNING (Patient taking differently: Take 75 mcg by mouth daily before breakfast.)   OXYGEN Inhale 3 L into the lungs continuous. 24/7   potassium chloride (KLOR-CON) 10 MEQ tablet TAKE 1 TABLET BY MOUTH 2 TIMES DAILY. (Patient taking differently: Take 10 mEq by mouth 2 (two) times daily.)   predniSONE (DELTASONE) 10 MG tablet 3 tabs by mouth per day for 3 days,2tabs per day for 3 days,1tab per day for 3 days   No facility-administered encounter medications on file as of 12/18/2021.   Patient Active Problem List   Diagnosis Date Noted   Physical deconditioning 12/05/2021   COPD exacerbation ([place]) 07/30/2021   Diabetes mellitus due to underlying condition without complication, without long-term current use of insulin ([place]) 07/22/2021   Steroid-dependent chronic obstructive pulmonary disease ([place]) 07/22/2021   Postablative hypothyroidism 07/22/2021   Encounter for well adult exam with abnormal findings 04/20/2021   Malnutrition of moderate degree 08/16/2019   Respiratory acidosis 03/07/2019   Goals of care, counseling/discussion 09/20/2018   Edema due to congestive heart failure ([place]) 07/27/2018   Palliative care by specialist    Shortness of breath    Chronic low  back pain 04/30/2018   COPD with acute exacerbation ([place])    Hypothyroidism    Anxiety    Respiratory failure with hypercapnia ([place]) 03/24/2018   Hypercarbia 03/22/2018   Polysubstance abuse ([place]) 01/26/2018   Aspiration into airway 06/03/2017   Rash 07/12/2016   Bilateral hearing loss 06/05/2016    Hyperglycemia 05/21/2016   Abnormal LFTs 03/24/2016   Solitary pulmonary nodule 01/09/2016   Chronic respiratory failure with hypoxia ([facility])    On home oxygen therapy    Chronic diastolic congestive heart failure ([place])    Bacteremia Step viridans 12/27/2015   Former smoker 12/25/2015   Hypothyroidism following radioiodine therapy 09/26/2014   Protein-calorie malnutrition, severe ([place]) 06/23/2014   Cor pulmonale ([place]) 01/12/2013   COPD (chronic obstructive pulmonary disease) ([place]) 11/29/2011   Macrocytosis without anemia 11/09/2011   Palliative care encounter 04/09/2011   SINUSITIS, CHRONIC 10/10/2010   Fatigue 10/10/2010   Depression with anxiety 07/21/2008   Essential hypertension 05/01/2007   ALLERGIC RHINITIS 05/01/2007   Asthma 05/01/2007   OSTEOPOROSIS 05/01/2007   Conditions to be addressed/monitored:  CHF and COPD  Care Plan : RN Care Manager Plan of Care  Updates made by Knox Royalty, RN since 12/18/2021 12:00 AM     Problem: Chronic Disease Management Needs   Priority: High     Long-Range Goal: Ongoing adherence to established plan of care for long term chronic disease management   Start Date: 11/21/2021  Expected End Date: 11/21/2022  Priority: High  Note:   Current Barriers:  Chronic Disease Management support and education needs related to CHF and COPD Frequent falls- patient reports 2-plus recent falls, both without serious injury; reports uses walker "all the time" Fragile state of health, multiple progressing severe chronic health conditions: on continuous home O2 at 3 L/min Most recent hospitalization October 11-13, 2022 for acute on chronic respiratory failure/ CHF exacerbation Inconsistent adherence to attending provider appointments Cancelled post-hospital discharge provider office visits to pulmonary provider 10/18/21; no-show to PCP 10/07/22 Was previously active with outpatient palliative care team; palliative care case closure 05/16/21: they were unable  to maintain contact with patient for ongoing palliative care services/ care SDOH concerns as follows: 12/03/21: confirmed [employer] has contacted/ spoken with patient and provided resources as requested Transportation: reports does not drive self; can usually depend on roommate (who happens to be her ex-husband) and/ or extended family ("nieces and nephews"), however, she reports, "they are not doing so good lately either;" she is uncertain if she will be able to rely on these family members in the future Finances and food acquisition, in light of current economy: reports does not qualify for food stamps or medicaid, "they say I make too much money" Home in need of repair: reports "front porch steps needs sturdy railing, and the steps need repair;" confirms she does have a ramp that she is able to use off the back entry Need for assistance with hygiene/ bathing, household management/ chores, shopping, and errands  RNCM Clinical Goal(s):  Patient will demonstrate improved health management independence as evidenced by adherence to plan of care for COPD; CHF        through collaboration with RN Care manager, provider, and care team.   Interventions: 1:1 collaboration with primary care provider regarding development and update of comprehensive plan of care as evidenced by provider attestation and co-signature Inter-disciplinary care team collaboration (see longitudinal plan of care) Evaluation of current treatment plan related to  self management and patient's adherence  to plan as established by provider 12/03/21: CCM RN CM initial assessment completed; SDOH  needs- confirmed [employer] has contacted patient and provided resources 12/18/21: Reviewed with patient recent pulmonary provider office visit 12/05/21: she verbalizes good general understanding of post-office visit instructions and denies questions; confirms she is taking prednisone as prescribed post-office  visit Falls assessment updated: patient denies new/ recent falls since our last outreach 12/03/21- using walker when she goes out; previously provided education around fall risks/ prevention reinforced; will provide additional printed educational material; positive reinforcement provided with encouragement to continue efforts at fall prevention As per PCP office visit instruction 11/28/21- reminded patient that PCP wanted to see her again in follow up in three months- encouraged her to consider scheduling now prior to PCP schedule becoming filled up- she states she will do  Heart Failure/ COPD Interventions:  (Status: 12/18/21: Goal on Track (progressing): YES.)  Long Term Goal  Wt Readings from Last 3 Encounters:  12/05/21 120 lb (54.4 kg)  08/01/21 124 lb 12.5 oz (56.6 kg)  07/22/21 130 lb (59 kg)  Reviewed role of diuretics in prevention of fluid overload and management of heart failure Discussed the importance of keeping all appointments with provider Advised patient to track and manage COPD triggers Provided instruction about proper use of medications used for management of COPD including inhalers Advised patient to self assesses COPD action plan zone and make appointment with provider if in the yellow zone for 48 hours without improvement Discussed the importance of adequate rest and management of fatigue with COPD  Reviewed recent pulmonary provider office visit 12/05/21 Confirmed patient continues self-managing medications, denies concerns around medications Reports using rescue inhaler 3-4 times per day Reports using nebulizer 1-2 times per day Endorses adherence to use of maintenance inhaler (Trelegy) QD Reports continues using home O2 as prescribed; runs at 3-4 L/min Reports breathing at baseline today: states she continues using rescue inhaler and nebulizer daily and has been using a "fan" as well, which she again reports "helps make breathing easier" Reports baseline swelling of lower  extremities without change: confirmed patient continues to endorse taking diuretic as prescribed Patient states she is considering contacting her home [place] [name]) to obtain instruction in use of nebulizer- "I want to make sure I am using it correctly;" this was encouraged  Patient Goals/Self-Care Activities: As evidenced by review of EHR, collaboration with care team, and patient reporting during CCM RN CM outreach,  Patient [place] will: Take medications as prescribed Attend all scheduled provider appointments Call pharmacy for medication refills Call provider office for new concerns or questions Continue to use your oxygen, and your inhalers/ nebulizer as prescribed Keep up the great work preventing recent falls- continue using your walker as needed Please make a follow up appointment with [name]: he wanted to see you again after your last appointment anytime near Feb 25, 2022 (3 months after your last office visit with him on 11/28/21)     Plan: Telephone follow up appointment with care management team member scheduled for:  Tuesday, Mar 11, 2022 at 11:30 am The patient has been provided with contact information for the care management team and has been advised to call with any health related questions or concerns   [name], RN, BSN, [place] [phone]: direct office

## 2021-12-20 ENCOUNTER — Telehealth: Payer: Self-pay | Admitting: Internal Medicine

## 2021-12-20 DIAGNOSIS — Z1211 Encounter for screening for malignant neoplasm of colon: Secondary | ICD-10-CM

## 2021-12-20 NOTE — Telephone Encounter (Signed)
Ok this is done 

## 2021-12-20 NOTE — Telephone Encounter (Signed)
Patient calling in ? ?Patient says she received letter in mail that she is coming due for a colonoscopy ? ?Patient says she would provider to submit referral to gastro so she can have it done in office.. says she did the cologuard test last time & she had a hard time getting it done ? ?Please let her know when referral has been submitted [phone] ?

## 2021-12-23 ENCOUNTER — Other Ambulatory Visit: Payer: Self-pay | Admitting: Internal Medicine

## 2021-12-23 NOTE — Telephone Encounter (Signed)
Patient calling in ? ?Seems to be confused on how she should have taken the med predniSONE (DELTASONE) 10 MG tablet prescribed 02.09.23 ? ?Would like a call w/ nurse to discuss [phone] ?

## 2021-12-23 NOTE — Telephone Encounter (Signed)
Patient states that she misunderstood on how to take Prednisone. She has been taking it once daily instead of tapered dose. Advised patient to complete the course of Prednisone and give [place] a call if she has any problems. Patient verbalizes understanding.  ?

## 2021-12-25 ENCOUNTER — Ambulatory Visit: Payer: Self-pay

## 2022-01-02 ENCOUNTER — Telehealth: Payer: Self-pay | Admitting: Pulmonary Disease

## 2022-01-03 NOTE — Telephone Encounter (Signed)
Called patient and she was wanting to get some advise from Dr Elsworth Soho on a colonoscopy that she is having in May 2023. She is wanting to get information for this procedure while being on oxygen. She wants to know if its safe? ? ?Dr Elsworth Soho please advise  ?

## 2022-01-07 ENCOUNTER — Telehealth: Payer: Self-pay | Admitting: Physician Assistant

## 2022-01-07 NOTE — Telephone Encounter (Signed)
Spoke to patient and relayed below message. She voiced her understanding.  Nothing further needed.   

## 2022-01-07 NOTE — Telephone Encounter (Signed)
Received referral for patient to have another colonoscopy. She is wanting to know if she should proceed with scheduling or do we cologuard. Please advise. ?

## 2022-01-08 NOTE — Telephone Encounter (Signed)
Patient had a negative result on the Cologuard in 2020. Does  she need an office visit before repeating this? ?

## 2022-01-08 NOTE — Telephone Encounter (Signed)
We usually stop colorectal screening after age 77 in average risk patients. She doesn't need GI office visit, follow up with PCP. Thanks ?

## 2022-01-08 NOTE — Telephone Encounter (Signed)
Patient advised.

## 2022-01-14 ENCOUNTER — Telehealth: Payer: Self-pay | Admitting: *Deleted

## 2022-01-14 ENCOUNTER — Telehealth: Payer: Self-pay

## 2022-01-14 NOTE — Telephone Encounter (Signed)
Pt is requesting a refill on: ?predniSONE (DELTASONE) 10 MG tablet ? ?Pharmacy: ?[place], [place] [facility] [place] ? ?LOV 11/28/21 ?ROV 02/25/22 ?

## 2022-01-14 NOTE — Telephone Encounter (Signed)
? ?  Telephone encounter was:  Unsuccessful.  01/14/2022 ?Name: Jasmine [name] MRN: [id number] DOB: January 03, 1946 ? ?Unsuccessful outbound call made today to assist with:   called office looking for me ? ?Outreach Attempt:  1st Attempt ? ?No answer. ? ?Lovett Sox -Selinda Eon ?Care Guide , Embedded Care Coordination ?[place], Care Management  ?[phone] ?[address] , [place] [address] ?Email : [email or web address] ?  ?

## 2022-01-15 MED ORDER — PREDNISONE 10 MG PO TABS: ORAL_TABLET | ORAL | 0 refills | Status: DC

## 2022-01-15 NOTE — Telephone Encounter (Signed)
Ok done erx 

## 2022-01-15 NOTE — Telephone Encounter (Signed)
Patient is requesting Prednisone refill, okay to refill? ?

## 2022-01-17 ENCOUNTER — Telehealth: Payer: Self-pay

## 2022-01-17 ENCOUNTER — Other Ambulatory Visit: Payer: Self-pay

## 2022-01-17 ENCOUNTER — Telehealth: Payer: Self-pay | Admitting: Internal Medicine

## 2022-01-17 DIAGNOSIS — I5032 Chronic diastolic (congestive) heart failure: Secondary | ICD-10-CM

## 2022-01-17 DIAGNOSIS — J449 Chronic obstructive pulmonary disease, unspecified: Secondary | ICD-10-CM

## 2022-01-17 NOTE — Telephone Encounter (Signed)
Patient called leaving a voicemail  wanting to get on the list for community health solutions & development and eligibility services. ? ?

## 2022-01-17 NOTE — Telephone Encounter (Signed)
Referral placed to community resource care guide team ?

## 2022-01-17 NOTE — Telephone Encounter (Signed)
? ?  Telephone encounter was:  Unsuccessful.  01/17/2022 ?Name: Jasmine [name] MRN: [id number] DOB: Mar 05, 1946 ? ?Unsuccessful outbound call made today to assist with:  Caregiver Stress ? ?Outreach Attempt:  1st Attempt ? ?A HIPAA compliant voice message was left requesting a return call.  Instructed patient to call back at left a message to call back at earliest convenience. ?. ? ? ? ?Larena Sox ?Care Guide, Embedded Care Coordination ?[place], Care Management  ?[phone] ?[address], [place], [place] [address] ?Phone: [phone] ?Email: [email or web address] ? ?  ?

## 2022-01-20 ENCOUNTER — Telehealth: Payer: Self-pay

## 2022-01-20 ENCOUNTER — Ambulatory Visit: Payer: Medicare HMO | Admitting: Internal Medicine

## 2022-01-20 ENCOUNTER — Other Ambulatory Visit: Payer: Self-pay | Admitting: Internal Medicine

## 2022-01-20 NOTE — Telephone Encounter (Signed)
? ?  Telephone encounter was:  Successful.  ?01/20/2022 ?Name: [name] MRN: [id number] DOB: Feb 28, 1946 ? ?Jasmine [name] is a 76 y.o. year old female who is a primary care patient of Jenny Reichmann, Hunt Oris, MD . The community resource team was consulted for assistance with Home Modifications ? ?Care guide performed the following interventions: Patient provided with information about care guide support team and interviewed to confirm resource needs. Patient stated she had safety concerns with her home and repaird need to be made. I did give her some resouces over the phone but I will reach back out to make more calls with her because she was getting tired and short of breath  ? ?Follow Up Plan:  Care guide will follow up with patient by phone over the next day ? ? ? ?Larena Sox ?Care Guide, Embedded Care Coordination ?[place], Care Management  ?[phone] ?[address], [place], [place] [address] ?Phone: [phone] ?Email: [email or web address] ? ?  ?

## 2022-01-20 NOTE — Telephone Encounter (Signed)
? ?  Telephone encounter was:  Unsuccessful.  01/20/2022 ?Name: Jasmine [name] MRN: [id number] DOB: 10-14-1946 ? ?Unsuccessful outbound call made today to assist with:  Home Modifications ? ?Outreach Attempt:  2nd Attempt ? ?A HIPAA compliant voice message was left requesting a return call.  Instructed patient to call back at earliest convenience. ?. ? ? ?Larena Sox ?Care Guide, Embedded Care Coordination ?[place], Care Management  ?[phone] ?[address], [place], [place] [address] ?Phone: [phone] ?Email: [email or web address] ? ?  ?

## 2022-01-20 NOTE — Telephone Encounter (Signed)
Please refill as per office routine med refill policy (all routine meds to be refilled for 3 mo or monthly (per pt preference) up to one year from last visit, then month to month grace period for 3 mo, then further med refills will have to be denied) ? ?

## 2022-01-21 ENCOUNTER — Telehealth: Payer: Self-pay

## 2022-01-21 NOTE — Telephone Encounter (Signed)
? ?  Telephone encounter was:  Successful.  ?01/21/2022 ?Name: Jasmine [name] MRN: [id number] DOB: 1945/10/30 ? ?Jasmine [name] is a 76 y.o. year old female who is a primary care patient of Jenny Reichmann, Hunt Oris, MD . The community resource team was consulted for assistance with  home repair ? ?Care guide performed the following interventions: Patient provided with information about care guide support team and interviewed to confirm resource needs.follow with resource contact information ?Follow Up Plan:  Care guide will follow up with patient by phone over the next day ? ? ? ?Larena Sox ?Care Guide, Embedded Care Coordination ?[place], Care Management  ?[phone] ?[address], [place], [place] [address] ?Phone: [phone] ?Email: [email or web address] ? ?  ?

## 2022-01-21 NOTE — Telephone Encounter (Signed)
/  Pt is calling to requesting refill ?albuterol (VENTOLIN HFA) 108 (90 Base) MCG/ACT inhaler ? ?Pharmacy: ?[place], [place] [facility] [place] ? ? ?LOV 11/28/21 ?ROV 02/25/22 ? ?

## 2022-01-27 ENCOUNTER — Ambulatory Visit (INDEPENDENT_AMBULATORY_CARE_PROVIDER_SITE_OTHER): Payer: Medicare HMO

## 2022-01-27 DIAGNOSIS — Z780 Asymptomatic menopausal state: Secondary | ICD-10-CM | POA: Diagnosis not present

## 2022-01-27 DIAGNOSIS — Z0000 Encounter for general adult medical examination without abnormal findings: Secondary | ICD-10-CM

## 2022-01-27 NOTE — Patient Instructions (Signed)
Jasmine [name] , ?Thank you for taking time to come for your Medicare Wellness Visit. I appreciate your ongoing commitment to your health goals. Please review the following plan we discussed and let me know if I can assist you in the future.  ? ?Screening recommendations/referrals: ?Colonoscopy: no longer required  ?Mammogram: no longer required  ?Bone Density: referral 01/27/2022 ?Recommended yearly ophthalmology/optometry visit for glaucoma screening and checkup ?Recommended yearly dental visit for hygiene and checkup ? ?Vaccinations: ?Influenza vaccine: completed  ?Pneumococcal vaccine: completed  ?Tdap vaccine: due  ?Shingles vaccine: will consider    ? ?Advanced directives: none  ? ?Conditions/risks identified: none  ? ?Next appointment: none  ? ? ?Preventive Care 22 Years and Older, Female ?Preventive care refers to lifestyle choices and visits with your health care provider that can promote health and wellness. ?What does preventive care include? ?A yearly physical exam. This is also called an annual well check. ?Dental exams once or twice a year. ?Routine eye exams. Ask your health care provider how often you should have your eyes checked. ?Personal lifestyle choices, including: ?Daily care of your teeth and gums. ?Regular physical activity. ?Eating a healthy diet. ?Avoiding tobacco and drug use. ?Limiting alcohol use. ?Practicing safe sex. ?Taking low-dose aspirin every day. ?Taking vitamin and mineral supplements as recommended by your health care provider. ?What happens during an annual well check? ?The services and screenings done by your health care provider during your annual well check will depend on your age, overall health, lifestyle risk factors, and family history of disease. ?Counseling  ?Your health care provider may ask you questions about your: ?Alcohol use. ?Tobacco use. ?Drug use. ?Emotional well-being. ?Home and relationship well-being. ?Sexual activity. ?Eating habits. ?History of  falls. ?Memory and ability to understand (cognition). ?Work and work Statistician. ?Reproductive health. ?Screening  ?You may have the following tests or measurements: ?Height, weight, and BMI. ?Blood pressure. ?Lipid and cholesterol levels. These may be checked every 5 years, or more frequently if you are over 19 years old. ?Skin check. ?Lung cancer screening. You may have this screening every year starting at age 30 if you have a 30-pack-year history of smoking and currently smoke or have quit within the past 15 years. ?Fecal occult blood test (FOBT) of the stool. You may have this test every year starting at age 36. ?Flexible sigmoidoscopy or colonoscopy. You may have a sigmoidoscopy every 5 years or a colonoscopy every 10 years starting at age 47. ?Hepatitis C blood test. ?Hepatitis B blood test. ?Sexually transmitted disease (STD) testing. ?Diabetes screening. This is done by checking your blood sugar (glucose) after you have not eaten for a while (fasting). You may have this done every 1-3 years. ?Bone density scan. This is done to screen for osteoporosis. You may have this done starting at age 67. ?Mammogram. This may be done every 1-2 years. Talk to your health care provider about how often you should have regular mammograms. ?Talk with your health care provider about your test results, treatment options, and if necessary, the need for more tests. ?Vaccines  ?Your health care provider may recommend certain vaccines, such as: ?Influenza vaccine. This is recommended every year. ?Tetanus, diphtheria, and acellular pertussis (Tdap, Td) vaccine. You may need a Td booster every 10 years. ?Zoster vaccine. You may need this after age 69. ?Pneumococcal 13-valent conjugate (PCV13) vaccine. One dose is recommended after age 72. ?Pneumococcal polysaccharide (PPSV23) vaccine. One dose is recommended after age 18. ?Talk to your health care provider about which  screenings and vaccines you need and how often you need  them. ?This information is not intended to replace advice given to you by your health care provider. Make sure you discuss any questions you have with your health care provider. ?Document Released: 11/02/2015 Document Revised: 06/25/2016 Document Reviewed: 08/07/2015 ?Elsevier Interactive Patient Education ? 2017 [employer]. ? ?Fall Prevention in the Home ?Falls can cause injuries. They can happen to people of all ages. There are many things you can do to make your home safe and to help prevent falls. ?What can I do on the outside of my home? ?Regularly fix the edges of walkways and driveways and fix any cracks. ?Remove anything that might make you trip as you walk through a door, such as a raised step or threshold. ?Trim any bushes or trees on the path to your home. ?Use bright outdoor lighting. ?Clear any walking paths of anything that might make someone trip, such as rocks or tools. ?Regularly check to see if handrails are loose or broken. Make sure that both sides of any steps have handrails. ?Any raised decks and porches should have guardrails on the edges. ?Have any leaves, snow, or ice cleared regularly. ?Use sand or salt on walking paths during winter. ?Clean up any spills in your garage right away. This includes oil or grease spills. ?What can I do in the bathroom? ?Use night lights. ?Install grab bars by the toilet and in the tub and shower. Do not use towel bars as grab bars. ?Use non-skid mats or decals in the tub or shower. ?If you need to sit down in the shower, use a plastic, non-slip stool. ?Keep the floor dry. Clean up any water that spills on the floor as soon as it happens. ?Remove soap buildup in the tub or shower regularly. ?Attach bath mats securely with double-sided non-slip rug tape. ?Do not have throw rugs and other things on the floor that can make you trip. ?What can I do in the bedroom? ?Use night lights. ?Make sure that you have a light by your bed that is easy to reach. ?Do not use  any sheets or blankets that are too big for your bed. They should not hang down onto the floor. ?Have a firm chair that has side arms. You can use this for support while you get dressed. ?Do not have throw rugs and other things on the floor that can make you trip. ?What can I do in the kitchen? ?Clean up any spills right away. ?Avoid walking on wet floors. ?Keep items that you use a lot in easy-to-reach places. ?If you need to reach something above you, use a strong step stool that has a grab bar. ?Keep electrical cords out of the way. ?Do not use floor polish or wax that makes floors slippery. If you must use wax, use non-skid floor wax. ?Do not have throw rugs and other things on the floor that can make you trip. ?What can I do with my stairs? ?Do not leave any items on the stairs. ?Make sure that there are handrails on both sides of the stairs and use them. Fix handrails that are broken or loose. Make sure that handrails are as long as the stairways. ?Check any carpeting to make sure that it is firmly attached to the stairs. Fix any carpet that is loose or worn. ?Avoid having throw rugs at the top or bottom of the stairs. If you do have throw rugs, attach them to the floor with carpet  tape. ?Make sure that you have a light switch at the top of the stairs and the bottom of the stairs. If you do not have them, ask someone to add them for you. ?What else can I do to help prevent falls? ?Wear shoes that: ?Do not have high heels. ?Have rubber bottoms. ?Are comfortable and fit you well. ?Are closed at the toe. Do not wear sandals. ?If you use a stepladder: ?Make sure that it is fully opened. Do not climb a closed stepladder. ?Make sure that both sides of the stepladder are locked into place. ?Ask someone to hold it for you, if possible. ?Clearly mark and make sure that you can see: ?Any grab bars or handrails. ?First and last steps. ?Where the edge of each step is. ?Use tools that help you move around (mobility aids)  if they are needed. These include: ?Canes. ?Walkers. ?Scooters. ?Crutches. ?Turn on the lights when you go into a dark area. Replace any light bulbs as soon as they burn out. ?Set up your furniture so you have a clear path.

## 2022-01-27 NOTE — Progress Notes (Signed)
? ?Subjective:  ? Jasmine [name] is a 76 y.o. female who presents for Medicare Annual (Subsequent) preventive examination. ? ? ?I connected with [name]  today by telephone and verified that I am speaking with the correct person using two identifiers. ?Location patient: home ?Location provider: work ?Persons participating in the virtual visit: patient, provider. ?  ?I discussed the limitations, risks, security and privacy concerns of performing an evaluation and management service by telephone and the availability of in person appointments. I also discussed with the patient that there may be a patient responsible charge related to this service. The patient expressed understanding and verbally consented to this telephonic visit.  ?  ?Interactive audio and video telecommunications were attempted between this provider and patient, however failed, due to patient having technical difficulties OR patient did not have access to video capability.  We continued and completed visit with audio only. ? ?  ?Review of Systems    ? ?Cardiac Risk Factors include: advanced age (>48mn, >>64women);dyslipidemia ? ?   ?Objective:  ?  ?Today's Vitals  ? ?There is no height or weight on file to calculate BMI. ? ? ?  01/27/2022  ?  1:46 PM 07/31/2021  ?  2:31 PM 07/30/2021  ?  3:23 PM 06/27/2021  ?  9:24 AM 04/16/2020  ? 10:12 AM 03/29/2020  ?  4:50 PM 03/16/2020  ? 12:35 AM  ?Advanced Directives  ?Does Patient Have a Medical Advance Directive? No  No No;Yes No;Yes No No  ?Type of ATheatre stage managerof APilgrimLiving will HDetmoldLiving will    ?Would patient like information on creating a medical advance directive? No - Patient declined No - Patient declined    No - Patient declined No - Patient declined  ? ? ?Current Medications (verified) ?Outpatient Encounter Medications as of 01/27/2022  ?Medication Sig  ? albuterol (VENTOLIN HFA) 108 (90 Base) MCG/ACT inhaler INHALE 1 TO 2 PUFFS INTO THE  LUNGS EVERY 6 HOURS AS NEEDED FOR WHEEZING OR SHORTNESS OF BREATH  ? aspirin EC 81 MG EC tablet Take 1 tablet (81 mg total) by mouth daily.  ? atorvastatin (LIPITOR) 10 MG tablet TAKE 1 TABLET BY MOUTH DAILY. (Patient taking differently: Take 10 mg by mouth daily.)  ? citalopram (CELEXA) 20 MG tablet TAKE ONE TABLET BY MOUTH DAILY (Patient taking differently: Take 20 mg by mouth daily. TAKE ONE TABLET BY MOUTH DAILY)  ? Fluticasone-Umeclidin-Vilant (TRELEGY ELLIPTA) 200-62.5-25 MCG/INH AEPB Inhale 1 puff into the lungs daily.  ? furosemide (LASIX) 40 MG tablet 1 tab by mouth twice per day (Patient taking differently: Take 40 mg by mouth 2 (two) times daily.)  ? guaiFENesin (ROBITUSSIN) 100 MG/5ML liquid Take 5 mLs by mouth every 4 (four) hours as needed for cough or to loosen phlegm. Mucinex chest congestion (liquid)  ? ipratropium-albuterol (DUONEB) 0.5-2.5 (3) MG/3ML SOLN Take 3 mLs by nebulization every 6 (six) hours as needed.  ? levothyroxine (SYNTHROID) 75 MCG tablet TAKE ONE TABLET BY MOUTH EARLY MORNING (Patient taking differently: Take 75 mcg by mouth daily before breakfast.)  ? OXYGEN Inhale 3 L into the lungs continuous. 24/7  ? potassium chloride (KLOR-CON) 10 MEQ tablet TAKE 1 TABLET BY MOUTH 2 TIMES DAILY. (Patient taking differently: Take 10 mEq by mouth 2 (two) times daily.)  ? predniSONE (DELTASONE) 10 MG tablet 3 tabs by mouth per day for 3 days,2tabs per day for 3 days,1tab per day for 3 days  ? ?  No facility-administered encounter medications on file as of 01/27/2022.  ? ? ?Allergies (verified) ?Alendronate sodium  ? ?History: ?Past Medical History:  ?Diagnosis Date  ? ALLERGIC RHINITIS 05/01/2007  ? Anxiety   ? Arthritis   ? "hands" (12/28/2015)  ? ASTHMA 05/01/2007  ? "since I was a child"  ? Cancer [facility])   ? LUNG  ? CHF (congestive heart failure) ([place])   ? Chronic bronchitis ([place])   ? COPD (chronic obstructive pulmonary disease) ([place])   ? DEPRESSION 07/21/2008  ? DVT (deep venous thrombosis) ([place])  1960s  ? LLE  ? FATIGUE 10/10/2010  ? HYPERTENSION 05/01/2007  ? HYPOTHYROIDISM 08/23/2009  ? s/p RAI  ? LUMBAR RADICULOPATHY, RIGHT 08/25/2008  ? On home oxygen therapy   ? "3-4L; qd; all the time" (12/28/2015)  ? OSTEOPOROSIS 05/01/2007  ? SHOULDER PAIN, LEFT 08/23/2009  ? SINUSITIS, CHRONIC 10/10/2010  ? ?Past Surgical History:  ?Procedure Laterality Date  ? APPENDECTOMY    ? DILATION AND CURETTAGE OF UTERUS  multiple  ? history of multiple dialations and curettages and miscarriages, unfortunately never carrying a child to term  ? ECTOPIC PREGNANCY SURGERY  "late '60s or early '70s  ? ELECTROCARDIOGRAM  06/20/2006  ? OOPHORECTOMY Right   ? ?Family History  ?Problem Relation Age of Onset  ? Lung cancer Father   ? Alcohol abuse Brother   ? Diabetes Brother   ? Hypertension Other   ? Stroke Mother   ? Thyroid disease Mother   ? Asthma Other   ?     maternal aunts  ? Breast cancer Neg Hx   ? ?Social History  ? ?Socioeconomic History  ? Marital status: Significant Other  ?  Spouse name: Not on file  ? Number of children: 0  ? Years of education: Not on file  ? Highest education level: Not on file  ?Occupational History  ? Occupation: RETIRED - Web designer  ?  Employer: [employer]  ?Tobacco Use  ? Smoking status: Former  ?  Packs/day: 1.00  ?  Years: 52.00  ?  Pack years: 52.00  ?  Types: Cigarettes  ?  Start date: 41  ?  Quit date: 07/20/2016  ?  Years since quitting: 5.5  ? Smokeless tobacco: Never  ?Vaping Use  ? Vaping Use: Never used  ?Substance and Sexual Activity  ? Alcohol use: Not Currently  ? Drug use: Not Currently  ? Sexual activity: Not Currently  ?  Partners: Male  ?  Birth control/protection: Post-menopausal  ?Other Topics Concern  ? Not on file  ?Social History Narrative  ? Lives in [place], [place]. Lives with husband, no children, but husband has 3 kids. Ambulatory, without cane or walker. Smokes cigarettes, currently 1 ppd, smoked for >20-30 yrs. Drinks beer, ~2/day, occasionally liquor, but  ? suspect minimizing. Occasional MJ and endorses crack cocaine occasionally  ? ?Social Determinants of Health  ? ?Financial Resource Strain: Low Risk   ? Difficulty of Paying Living Expenses: Not hard at all  ?Food Insecurity: No Food Insecurity  ? Worried About Charity fundraiser in the Last Year: Never true  ? Ran Out of Food in the Last Year: Never true  ?Transportation Needs: No Transportation Needs  ? Lack of Transportation (Medical): No  ? Lack of Transportation (Non-Medical): No  ?Physical Activity: Inactive  ? Days of Exercise per Week: 0 days  ? Minutes of Exercise per Session: 0 min  ?Stress: No Stress Concern Present  ?  Feeling of Stress : Not at all  ?Social Connections: Moderately Isolated  ? Frequency of Communication with Friends and Family: Twice a week  ? Frequency of Social Gatherings with Friends and Family: Twice a week  ? Attends Religious Services: 1 to 4 times per year  ? Active Member of Clubs or Organizations: No  ? Attends Archivist Meetings: Never  ? Marital Status: Widowed  ? ? ?Tobacco Counseling ?Counseling given: Not Answered ? ? ?Clinical Intake: ? ?Pre-visit preparation completed: Yes ? ?Pain : No/denies pain ? ?  ? ?Nutritional Risks: None ?Diabetes: No ? ?How often do you need to have someone help you when you read instructions, pamphlets, or other written materials from your doctor or pharmacy?: 1 - Never ?What is the last grade level you completed in school?: High School ? ?Diabetic?no  ? ?Interpreter Needed?: No ? ?Information entered by :: G.[email or web address],LHT ? ? ?Activities of Daily Living ? ?  01/27/2022  ?  1:51 PM 07/31/2021  ?  2:31 PM  ?In your present state of health, do you have any difficulty performing the following activities:  ?Hearing? 0 1  ?Vision? 0 1  ?Difficulty concentrating or making decisions? 0 1  ?Walking or climbing stairs? 0 0  ?Dressing or bathing? 0 1  ?Doing errands, shopping? 0 0  ?Preparing Food and eating ? N   ?Using the Toilet? N   ?In the  past six months, have you accidently leaked urine? N   ?Do you have problems with loss of bowel control? N   ?Managing your Medications? N   ?Managing your Finances? N   ?Scientist, physiological

## 2022-02-05 ENCOUNTER — Telehealth: Payer: Self-pay | Admitting: Pulmonary Disease

## 2022-02-05 NOTE — Telephone Encounter (Signed)
Called and spoke with pt letting her know that she needed to contact PCP if she thinks she may have Shingles and she verbalized understanding. Nothing further needed. ?

## 2022-02-10 ENCOUNTER — Ambulatory Visit: Payer: TRICARE For Life (TFL) | Admitting: Internal Medicine

## 2022-02-14 ENCOUNTER — Ambulatory Visit: Payer: TRICARE For Life (TFL) | Admitting: Gastroenterology

## 2022-02-25 ENCOUNTER — Ambulatory Visit (INDEPENDENT_AMBULATORY_CARE_PROVIDER_SITE_OTHER): Payer: Medicare HMO | Admitting: Internal Medicine

## 2022-02-25 ENCOUNTER — Encounter: Payer: Self-pay | Admitting: Internal Medicine

## 2022-02-25 VITALS — BP 116/60 | HR 92 | Temp 98.5°F | Ht 60.0 in | Wt 116.8 lb

## 2022-02-25 VITALS — BP 114/70 | HR 85 | Ht 60.0 in | Wt 116.8 lb

## 2022-02-25 DIAGNOSIS — J441 Chronic obstructive pulmonary disease with (acute) exacerbation: Secondary | ICD-10-CM | POA: Diagnosis not present

## 2022-02-25 DIAGNOSIS — R739 Hyperglycemia, unspecified: Secondary | ICD-10-CM | POA: Diagnosis not present

## 2022-02-25 DIAGNOSIS — E039 Hypothyroidism, unspecified: Secondary | ICD-10-CM

## 2022-02-25 DIAGNOSIS — I10 Essential (primary) hypertension: Secondary | ICD-10-CM

## 2022-02-25 DIAGNOSIS — E538 Deficiency of other specified B group vitamins: Secondary | ICD-10-CM

## 2022-02-25 DIAGNOSIS — E1140 Type 2 diabetes mellitus with diabetic neuropathy, unspecified: Secondary | ICD-10-CM | POA: Diagnosis not present

## 2022-02-25 DIAGNOSIS — E559 Vitamin D deficiency, unspecified: Secondary | ICD-10-CM

## 2022-02-25 DIAGNOSIS — Z0001 Encounter for general adult medical examination with abnormal findings: Secondary | ICD-10-CM | POA: Diagnosis not present

## 2022-02-25 LAB — POCT GLUCOSE (DEVICE FOR HOME USE): Glucose Fasting, POC: 119 mg/dL — AB (ref 70–99)

## 2022-02-25 LAB — POCT GLYCOSYLATED HEMOGLOBIN (HGB A1C): Hemoglobin A1C: 6.5 % — AB (ref 4.0–5.6)

## 2022-02-25 MED ORDER — PREDNISONE 10 MG PO TABS: ORAL_TABLET | ORAL | 0 refills | Status: DC

## 2022-02-25 NOTE — Progress Notes (Signed)
?Name: Jasmine [name]  ?MRN/ DOB: [id number], [birth year]   ?Age/ Sex: 76 y.o., female   ? ?PCP: Biagio Borg, MD   ?Reason for Endocrinology Evaluation: Type 2 Diabetes Mellitus  ?   ?Date of Initial Endocrinology Visit: 07/22/2021  ? ? ?PATIENT IDENTIFIER: Jasmine [name] is a 76 y.o. female with a past medical history of T2DM , COPD, Osteoporosis, and postablative Hypothyroidism . The patient presented for initial endocrinology clinic visit on 07/22/2021 for consultative assistance with her diabetes management.  ? ? ?HPI: ?Jasmine [name] was  ? ? ?Diagnosed with DM 2021 ?Prior Medications tried/Intolerance: N/A ?Currently checking blood sugars 0 x / day  ?Hypoglycemia episodes : no             ?Hemoglobin A1c has ranged from 6.3% in 2021, peaking at 6.6% in  2022 ? ? ?In terms of diet, the patient eats 1 -2 meals a day . She avoids sugar sweetened beverages  ? ? ?Has LE edema for the past few years  ? ?Has been tired lately  ? ? ? ? ?THYROID HISTORY : ?She has a hx of Graves' Disease many years ago, she is S/P RAI ablation in 2006  ? ?She is on Levothyroxine 75 mcg daily  ? ? ? ? ?SUBJECTIVE:  ? ?During last visit (07/22/2021): A1c 6.3%  ? ? ? ? ?Today (02/25/22):  Jasmine [name] is here for a follow up on diabetes management and hypothyroidism. ?She does not check glucose at home  ? ?She is compliant with levothyroxine intake  ?Has noted decrease in appetite with weight loss , but recently has noted improvement with appetite  ? ?Denies diarrhea but has constipation  ?SOB is stable on Oxygen  ? ?Denies palpitations  ? ? ? ? ?HOME ENDOCRINE  REGIMEN: ?Levothyroxine 75 mcg daily  ? ? ?Statin: yes ?ACE-I/ARB: no ? ? ? ?DIABETIC COMPLICATIONS: ?Microvascular complications:  ?Denies: CKD,  ?Last Eye Exam: Completed   ? ?Macrovascular complications:  ? ?Denies: CAD, CVA, PVD ? ? ? ?PAST HISTORY: ?Past Medical History:  ?Past Medical History:  ?Diagnosis Date  ? ALLERGIC RHINITIS 05/01/2007  ? Anxiety   ? Arthritis   ? "hands"  (12/28/2015)  ? ASTHMA 05/01/2007  ? "since I was a child"  ? Cancer [facility])   ? LUNG  ? CHF (congestive heart failure) ([place])   ? Chronic bronchitis ([place])   ? COPD (chronic obstructive pulmonary disease) ([place])   ? DEPRESSION 07/21/2008  ? DVT (deep venous thrombosis) ([place]) 1960s  ? LLE  ? FATIGUE 10/10/2010  ? HYPERTENSION 05/01/2007  ? HYPOTHYROIDISM 08/23/2009  ? s/p RAI  ? LUMBAR RADICULOPATHY, RIGHT 08/25/2008  ? On home oxygen therapy   ? "3-4L; qd; all the time" (12/28/2015)  ? OSTEOPOROSIS 05/01/2007  ? SHOULDER PAIN, LEFT 08/23/2009  ? SINUSITIS, CHRONIC 10/10/2010  ? ?Past Surgical History:  ?Past Surgical History:  ?Procedure Laterality Date  ? APPENDECTOMY    ? DILATION AND CURETTAGE OF UTERUS  multiple  ? history of multiple dialations and curettages and miscarriages, unfortunately never carrying a child to term  ? ECTOPIC PREGNANCY SURGERY  "late '60s or early '70s  ? ELECTROCARDIOGRAM  06/20/2006  ? OOPHORECTOMY Right   ?  ?Social History:  reports that she quit smoking about 5 years ago. Her smoking use included cigarettes. She started smoking about 58 years ago. She has a 52.00 pack-year smoking history. She has never used smokeless tobacco. She reports that she does not  currently use alcohol. She reports that she does not currently use drugs. ?Family History:  ?Family History  ?Problem Relation Age of Onset  ? Lung cancer Father   ? Alcohol abuse Brother   ? Diabetes Brother   ? Hypertension Other   ? Stroke Mother   ? Thyroid disease Mother   ? Asthma Other   ?     maternal aunts  ? Breast cancer Neg Hx   ? ? ? ?HOME MEDICATIONS: ?Allergies as of 02/25/2022   ? ?   Reactions  ? Alendronate Sodium Other (See Comments)  ? Patient does not remember this reaction  ? ?  ? ?  ?Medication List  ?  ? ?  ? Accurate as of Feb 25, 2022  3:33 PM. If you have any questions, ask your nurse or doctor.  ?  ?  ? ?  ? ?albuterol 108 (90 Base) MCG/ACT inhaler ?Commonly known as: VENTOLIN HFA ?INHALE 1 TO 2 PUFFS INTO THE LUNGS  EVERY 6 HOURS AS NEEDED FOR WHEEZING OR SHORTNESS OF BREATH ?  ?aspirin 81 MG EC tablet ?Take 1 tablet (81 mg total) by mouth daily. ?  ?atorvastatin 10 MG tablet ?Commonly known as: LIPITOR ?TAKE 1 TABLET BY MOUTH DAILY. ?What changed:  ?how much to take ?how to take this ?when to take this ?additional instructions ?  ?citalopram 20 MG tablet ?Commonly known as: CELEXA ?TAKE ONE TABLET BY MOUTH DAILY ?What changed:  ?how much to take ?how to take this ?when to take this ?  ?furosemide 40 MG tablet ?Commonly known as: LASIX ?1 tab by mouth twice per day ?What changed:  ?how much to take ?how to take this ?when to take this ?additional instructions ?  ?guaiFENesin 100 MG/5ML liquid ?Commonly known as: ROBITUSSIN ?Take 5 mLs by mouth every 4 (four) hours as needed for cough or to loosen phlegm. Mucinex chest congestion (liquid) ?  ?ipratropium-albuterol 0.5-2.5 (3) MG/3ML Soln ?Commonly known as: DUONEB ?Take 3 mLs by nebulization every 6 (six) hours as needed. ?  ?levothyroxine 75 MCG tablet ?Commonly known as: SYNTHROID ?TAKE ONE TABLET BY MOUTH EARLY MORNING ?What changed:  ?how much to take ?how to take this ?when to take this ?additional instructions ?  ?OXYGEN ?Inhale 3 L into the lungs continuous. 24/7 ?  ?potassium chloride 10 MEQ tablet ?Commonly known as: KLOR-CON ?TAKE 1 TABLET BY MOUTH 2 TIMES DAILY. ?What changed:  ?how much to take ?how to take this ?when to take this ?additional instructions ?  ?predniSONE 10 MG tablet ?Commonly known as: DELTASONE ?3 tabs by mouth per day for 3 days,2tabs per day for 3 days,1tab per day for 3 days ?  ?Trelegy Ellipta 200-62.5-25 MCG/ACT Aepb ?Generic drug: Fluticasone-Umeclidin-Vilant ?Inhale 1 puff into the lungs daily. ?  ? ?  ? ? ? ?ALLERGIES: ?Allergies  ?Allergen Reactions  ? Alendronate Sodium Other (See Comments)  ?  Patient does not remember this reaction  ? ? ? ?REVIEW OF SYSTEMS: ?A comprehensive ROS was conducted with the patient and is negative except as  per HPI ? ?  ?OBJECTIVE:  ? ?VITAL SIGNS: BP 114/70 (BP Location: Left Arm, Patient Position: Sitting, Cuff Size: Small)   Pulse 85   Ht 5' (1.524 m)   Wt 116 lb 12.8 oz (53 kg)   SpO2 93%   BMI 22.81 kg/m?   ? ?PHYSICAL EXAM:  ?General: Pt appears well and is in NAD  ?Neck: General: Supple without adenopathy or carotid bruits. ?  Thyroid: Thyroid size normal.  No goiter or nodules appreciated.  ?Lungs: Clear with good BS bilat with no rales, rhonchi, or wheezes  ?Heart: RRR   ?Extremities:  ?Lower extremities -Trace  pretibial edema. No lesions.  ?Neuro: MS is good with appropriate affect, pt is alert and Ox3  ? ? ?DM Foot Exam 02/25/2022 ?The skin of the feet is without sores or ulcerations, toenails thickened and curved ?The pedal pulses are 1+ on right and 1+ on left. ?The sensation is absent to a screening 5.07, 10 gram monofilament bilaterally ? ? ?DATA REVIEWED: ? ?Lab Results  ?Component Value Date  ? HGBA1C 6.5 (A) 02/25/2022  ? HGBA1C 6.3 04/19/2021  ? HGBA1C 6.6 (H) 06/08/2020  ? ?Lab Results  ?Component Value Date  ? MICROALBUR 4.0 (H) 04/19/2021  ? [place] 86 04/19/2021  ? CREATININE 0.86 08/01/2021  ? ? Latest Reference Range & Units 02/25/22 15:32 02/25/22 15:49  ?Hemoglobin A1C 4.0 - 5.6 % 6.5 !   ?TSH 0.35 - 5.50 uIU/mL  1.22  ? ?ASSESSMENT / PLAN / RECOMMENDATIONS:  ? ?1) Type 2 Diabetes Mellitus, optimally controlled, with neuropathic complications: - Most recent A1c of 6.5 %. Goal A1c < 7.0 %.   ? ?-A1c continues to be at goal without any glycemic agents ?-Emphasized the importance of avoiding sugar sweetened beverages ? ? ?MEDICATIONS: ?N/A ? ? ? ?2) Postablative Hypothyroidism:  ? ?- Pt with fatigue ?-TSH is normal at 1.22u IU/mL ? ? ?Medication  ?Continue levothyroxine 75 mcg daily  ? ? ? ?F/U in 6 months  ? ? ?Signed electronically by: ?Abby Nena Jordan, MD ? ?[facility]  ?[facility] ?[address] ?[place], [place] [address] ?Phone:  [phone] ?FAX: [phone]  ? ?CC: ?Biagio Borg, MD ?[place] [address] ?Phone: [phone]  ?Fax: [phone] ? ? ? ?Return to Endocrinology clinic as below: ?Future Appointments  ?Date Time Provid

## 2022-02-25 NOTE — Progress Notes (Signed)
Patient ID: Jasmine [name], female   DOB: October 09, 1946, 76 y.o.   MRN: [id number] ? ? ? ?     Chief Complaint:: wellness exam and sob/wheezing, hyperglycemia, htn, low thyroid ? ?     HPI:  Jasmine [name] is a 76 y.o. female here for wellness exam; plans to see optho appt soon; decliens tdap, shingrix, colonoscopy o/w up to date ?         ?              Also has had mild worsening wheezing, sob in the past week; Pt denies chest pain, orthopnea, PND, increased LE swelling, palpitations, dizziness or syncope.  Pt denies polydipsia, polyuria, or new focal neuro s/s.    Pt denies fever, wt loss, night sweats, loss of appetite, or other constitutional symptoms  Sees endo for elevated sugar.  Denies hyper or hypo thyroid symptoms such as voice, skin or hair change.  Denies worsening depressive symptoms, suicidal ideation, or panic.  Not taking Vit D.   ?  ?Wt Readings from Last 3 Encounters:  ?02/25/22 116 lb 12.8 oz (53 kg)  ?02/25/22 116 lb 12.8 oz (53 kg)  ?12/05/21 120 lb (54.4 kg)  ? ?BP Readings from Last 3 Encounters:  ?02/25/22 114/70  ?02/25/22 116/60  ?12/05/21 110/60  ? ?Immunization History  ?Administered Date(s) Administered  ? Fluad Quad(high Dose 65+) 08/30/2019, 08/14/2020, 08/01/2021  ? Influenza Split 07/10/2011, 08/18/2012  ? Influenza Whole 07/21/2008, 08/23/2009, 10/10/2010  ? Influenza, High Dose Seasonal PF 07/10/2016, 09/03/2017  ? Influenza,inj,Quad PF,6+ Mos 08/12/2013, 07/10/2014, 07/08/2018  ? PFIZER(Purple Top)SARS-COV-2 Vaccination 12/24/2019, 01/14/2020, 11/22/2020  ? Pneumococcal Conjugate-13 11/30/2014  ? Pneumococcal Polysaccharide-23 07/21/2007, 01/12/2013, 06/16/2018  ? Td 08/23/2009  ? Zoster, Live 07/20/2006  ? ?There are no preventive care reminders to display for this patient. ? ?  ? ?Past Medical History:  ?Diagnosis Date  ? ALLERGIC RHINITIS 05/01/2007  ? Anxiety   ? Arthritis   ? "hands" (12/28/2015)  ? ASTHMA 05/01/2007  ? "since I was a child"  ? Cancer [facility])   ? LUNG  ? CHF  (congestive heart failure) ([place])   ? Chronic bronchitis ([place])   ? COPD (chronic obstructive pulmonary disease) ([place])   ? DEPRESSION 07/21/2008  ? DVT (deep venous thrombosis) ([place]) 1960s  ? LLE  ? FATIGUE 10/10/2010  ? HYPERTENSION 05/01/2007  ? HYPOTHYROIDISM 08/23/2009  ? s/p RAI  ? LUMBAR RADICULOPATHY, RIGHT 08/25/2008  ? On home oxygen therapy   ? "3-4L; qd; all the time" (12/28/2015)  ? OSTEOPOROSIS 05/01/2007  ? SHOULDER PAIN, LEFT 08/23/2009  ? SINUSITIS, CHRONIC 10/10/2010  ? ?Past Surgical History:  ?Procedure Laterality Date  ? APPENDECTOMY    ? DILATION AND CURETTAGE OF UTERUS  multiple  ? history of multiple dialations and curettages and miscarriages, unfortunately never carrying a child to term  ? ECTOPIC PREGNANCY SURGERY  "late '60s or early '70s  ? ELECTROCARDIOGRAM  06/20/2006  ? OOPHORECTOMY Right   ? ? reports that she quit smoking about 5 years ago. Her smoking use included cigarettes. She started smoking about 58 years ago. She has a 52.00 pack-year smoking history. She has never used smokeless tobacco. She reports that she does not currently use alcohol. She reports that she does not currently use drugs. ?family history includes Alcohol abuse in her brother; Asthma in an other family member; Diabetes in her brother; Hypertension in an other family member; Lung cancer in her father; Stroke in her mother;  Thyroid disease in her mother. ?Allergies  ?Allergen Reactions  ? Alendronate Sodium Other (See Comments)  ?  Patient does not remember this reaction  ? ?Current Outpatient Medications on File Prior to Visit  ?Medication Sig Dispense Refill  ? albuterol (VENTOLIN HFA) 108 (90 Base) MCG/ACT inhaler INHALE 1 TO 2 PUFFS INTO THE LUNGS EVERY 6 HOURS AS NEEDED FOR WHEEZING OR SHORTNESS OF BREATH 18 g 4  ? aspirin EC 81 MG EC tablet Take 1 tablet (81 mg total) by mouth daily. 30 tablet 0  ? atorvastatin (LIPITOR) 10 MG tablet TAKE 1 TABLET BY MOUTH DAILY. (Patient taking differently: Take 10 mg by mouth  daily.) 90 tablet 1  ? citalopram (CELEXA) 20 MG tablet TAKE ONE TABLET BY MOUTH DAILY (Patient taking differently: Take 20 mg by mouth daily. TAKE ONE TABLET BY MOUTH DAILY) 90 tablet 3  ? Fluticasone-Umeclidin-Vilant (TRELEGY ELLIPTA) 200-62.5-25 MCG/INH AEPB Inhale 1 puff into the lungs daily. 60 each 11  ? furosemide (LASIX) 40 MG tablet 1 tab by mouth twice per day (Patient taking differently: Take 40 mg by mouth 2 (two) times daily.) 180 tablet 3  ? guaiFENesin (ROBITUSSIN) 100 MG/5ML liquid Take 5 mLs by mouth every 4 (four) hours as needed for cough or to loosen phlegm. Mucinex chest congestion (liquid)    ? ipratropium-albuterol (DUONEB) 0.5-2.5 (3) MG/3ML SOLN Take 3 mLs by nebulization every 6 (six) hours as needed. 360 mL 11  ? OXYGEN Inhale 3 L into the lungs continuous. 24/7    ? potassium chloride (KLOR-CON) 10 MEQ tablet TAKE 1 TABLET BY MOUTH 2 TIMES DAILY. (Patient taking differently: Take 10 mEq by mouth 2 (two) times daily.) 180 tablet 3  ? ?No current facility-administered medications on file prior to visit.  ? ?     ROS:  All others reviewed and negative. ? ?Objective  ? ?     PE:  BP 116/60 (BP Location: Right Arm, Patient Position: Sitting, Cuff Size: Normal)   Pulse 92   Temp 98.5 ?F (36.9 ?C) (Oral)   Ht 5' (1.524 m)   Wt 116 lb 12.8 oz (53 kg)   SpO2 90%   BMI 22.81 kg/m?  ? ?              Constitutional: Pt appears in NAD, on home o2 ?              HENT: Head: NCAT.  ?              Right Ear: External ear normal.   ?              Left Ear: External ear normal.  ?              Eyes: . Pupils are equal, round, and reactive to light. Conjunctivae and EOM are normal ?              Nose: without d/c or deformity ?              Neck: Neck supple. Gross normal ROM ?              Cardiovascular: Normal rate and regular rhythm.   ?              Pulmonary/Chest: Effort normal and breath sounds without rales but few mild bialteral wheezing.  ?              Abd:  Soft, NT, ND, + BS, no  organomegaly ?              Neurological: Pt is alert. At baseline orientation, motor grossly intact ?              Skin: Skin is warm. No rashes, no other new lesions, LE edema - none ?              Psychiatric: Pt behavior is normal without agitation  ? ?Micro: none ? ?Cardiac tracings I have personally interpreted today:  none ? ?Pertinent Radiological findings (summarize): none  ? ?Lab Results  ?Component Value Date  ? WBC 6.7 02/25/2022  ? HGB 12.5 02/25/2022  ? HCT 39.0 02/25/2022  ? PLT 203.0 02/25/2022  ? GLUCOSE 113 (H) 02/25/2022  ? CHOL 188 02/25/2022  ? TRIG 44.0 02/25/2022  ? HDL 102.60 02/25/2022  ? LDLDIRECT 102.1 10/10/2010  ? [place] 76 02/25/2022  ? ALT 22 02/25/2022  ? AST 31 02/25/2022  ? NA 138 02/25/2022  ? K 3.8 02/25/2022  ? CL 85 (L) 02/25/2022  ? CREATININE 0.79 02/25/2022  ? BUN 15 02/25/2022  ? CO2 48 (H) 02/25/2022  ? TSH 1.22 02/25/2022  ? INR 1.58 (H) 03/25/2016  ? HGBA1C 6.5 (A) 02/25/2022  ? MICROALBUR 7.2 (H) 02/25/2022  ? ?Assessment/Plan:  ?ARAYLA KRUSCHKE is a 76 y.o. Black or African American [2] female with  has a past medical history of ALLERGIC RHINITIS (05/01/2007), Anxiety, Arthritis, ASTHMA (05/01/2007), Cancer (HCC), CHF (congestive heart failure) ([place]), Chronic bronchitis ([place]), COPD (chronic obstructive pulmonary disease) ([place]), DEPRESSION (07/21/2008), DVT (deep venous thrombosis) ([place]) (1960s), FATIGUE (10/10/2010), HYPERTENSION (05/01/2007), HYPOTHYROIDISM (08/23/2009), LUMBAR RADICULOPATHY, RIGHT (08/25/2008), On home oxygen therapy, OSTEOPOROSIS (05/01/2007), SHOULDER PAIN, LEFT (08/23/2009), and SINUSITIS, CHRONIC (10/10/2010). ? ?Encounter for well adult exam with abnormal findings ?Age and sex appropriate education and counseling updated with regular exercise and diet ?Referrals for preventative services - plans to see optho soon, declines colonoscopy ?Immunizations addressed - declines tdap, shingrix ?Smoking counseling  - none needed ?Evidence for depression or other  mood disorder - none significant ?Most recent labs reviewed. ?I have personally reviewed and have noted: ?1) the patient's medical and social history ?2) The patient's current medications and supplements ?3) The patient's he

## 2022-02-25 NOTE — Patient Instructions (Addendum)
Please take all new medication as prescribed- the prednsone ? ?Please continue all other medications as before, and refills have been done if requested. ? ?Please have the pharmacy call with any other refills you may need. ? ?Please continue your efforts at being more active, low cholesterol diet, and weight control. ? ?You are otherwise up to date with prevention measures today. ? ?Please keep your appointments with your specialists as you may have planned ? ?Please go to the LAB at the blood drawing area for the tests to be done at the [facility] lab at your convenience ? ?You will be contacted by phone if any changes need to be made immediately.  Otherwise, you will receive a letter about your results with an explanation, but please check with MyChart first. ? ?Please remember to sign up for MyChart if you have not done so, as this will be important to you in the future with finding out test results, communicating by private email, and scheduling acute appointments online when needed. ? ?Please make an Appointment to return in 6 months, or sooner if needed ?

## 2022-02-26 ENCOUNTER — Ambulatory Visit: Payer: TRICARE For Life (TFL) | Admitting: Internal Medicine

## 2022-02-26 ENCOUNTER — Encounter: Payer: Self-pay | Admitting: Internal Medicine

## 2022-02-26 LAB — URINALYSIS, ROUTINE W REFLEX MICROSCOPIC
Bilirubin Urine: NEGATIVE
Hgb urine dipstick: NEGATIVE
Ketones, ur: NEGATIVE
Leukocytes,Ua: NEGATIVE
Nitrite: NEGATIVE
RBC / HPF: NONE SEEN (ref 0–?)
Specific Gravity, Urine: 1.01 (ref 1.000–1.030)
Total Protein, Urine: NEGATIVE
Urine Glucose: NEGATIVE
Urobilinogen, UA: 0.2 (ref 0.0–1.0)
pH: 7 (ref 5.0–8.0)

## 2022-02-26 LAB — HEPATIC FUNCTION PANEL
ALT: 22 U/L (ref 0–35)
AST: 31 U/L (ref 0–37)
Albumin: 4.3 g/dL (ref 3.5–5.2)
Alkaline Phosphatase: 60 U/L (ref 39–117)
Bilirubin, Direct: 0.1 mg/dL (ref 0.0–0.3)
Total Bilirubin: 0.6 mg/dL (ref 0.2–1.2)
Total Protein: 7.1 g/dL (ref 6.0–8.3)

## 2022-02-26 LAB — BASIC METABOLIC PANEL
BUN: 15 mg/dL (ref 6–23)
CO2: 48 mEq/L — ABNORMAL HIGH (ref 19–32)
Calcium: 9.7 mg/dL (ref 8.4–10.5)
Chloride: 85 mEq/L — ABNORMAL LOW (ref 96–112)
Creatinine, Ser: 0.79 mg/dL (ref 0.40–1.20)
GFR: 72.21 mL/min (ref >60.00)
Glucose, Bld: 113 mg/dL — ABNORMAL HIGH (ref 70–99)
Potassium: 3.8 mEq/L (ref 3.5–5.1)
Sodium: 138 mEq/L (ref 135–145)

## 2022-02-26 LAB — LIPID PANEL
Cholesterol: 188 mg/dL (ref 0–200)
HDL: 102.6 mg/dL (ref >39.00)
LDL Cholesterol: 76 mg/dL (ref 0–99)
NonHDL: 84.93
Total CHOL/HDL Ratio: 2
Triglycerides: 44 mg/dL (ref 0.0–149.0)
VLDL: 8.8 mg/dL (ref 0.0–40.0)

## 2022-02-26 LAB — VITAMIN D 25 HYDROXY (VIT D DEFICIENCY, FRACTURES): VITD: 19.39 ng/mL — ABNORMAL LOW (ref 30.00–100.00)

## 2022-02-26 LAB — CBC WITH DIFFERENTIAL/PLATELET
Basophils Absolute: 0.1 10*3/uL (ref 0.0–0.1)
Basophils Relative: 1 % (ref 0.0–3.0)
Eosinophils Absolute: 0.1 10*3/uL (ref 0.0–0.7)
Eosinophils Relative: 1.5 % (ref 0.0–5.0)
HCT: 39 % (ref 36.0–46.0)
Hemoglobin: 12.5 g/dL (ref 12.0–15.0)
Lymphocytes Relative: 14.4 % (ref 12.0–46.0)
Lymphs Abs: 1 10*3/uL (ref 0.7–4.0)
MCHC: 32.1 g/dL (ref 30.0–36.0)
MCV: 96 fl (ref 78.0–100.0)
Monocytes Absolute: 0.8 10*3/uL (ref 0.1–1.0)
Monocytes Relative: 11.5 % (ref 3.0–12.0)
Neutro Abs: 4.8 10*3/uL (ref 1.4–7.7)
Neutrophils Relative %: 71.6 % (ref 43.0–77.0)
Platelets: 203 10*3/uL (ref 150.0–400.0)
RBC: 4.06 Mil/uL (ref 3.87–5.11)
RDW: 14.3 % (ref 11.5–15.5)
WBC: 6.7 10*3/uL (ref 4.0–10.5)

## 2022-02-26 LAB — MICROALBUMIN / CREATININE URINE RATIO
Creatinine,U: 43.3 mg/dL
Microalb Creat Ratio: 16.6 mg/g (ref 0.0–30.0)
Microalb, Ur: 7.2 mg/dL — ABNORMAL HIGH (ref 0.0–1.9)

## 2022-02-26 LAB — VITAMIN B12: Vitamin B-12: 471 pg/mL (ref 211–911)

## 2022-02-26 LAB — TSH: TSH: 1.22 u[IU]/mL (ref 0.35–5.50)

## 2022-02-26 MED ORDER — LEVOTHYROXINE SODIUM 75 MCG PO TABS: 75.0000 ug | ORAL_TABLET | Freq: Every day | ORAL | 3 refills | Status: DC

## 2022-02-26 NOTE — Progress Notes (Signed)
76 y.o. [id number] Significant Other Black or African American Not Hispanic or Latino female here for annual exam.   ?Last year the patient c/o brown vaginal d/c, her pap was normal. U/S was recommended. She never did it. She still notices the brown d/c off and on.  ? ?Lots of swelling in her legs.  ? ?She denies bladder issues. No bowel issues. If she gets a little constipated, prune juice helps.  ? ?Not sexually active. Still with her long term partner (her ex husband).  ?  ? ?No LMP recorded. Patient is postmenopausal.          ?Sexually active: No.  ?The current method of family planning is post menopausal status.    ?Exercising: No.  The patient does not participate in regular exercise at present. ?Smoker:  no ? ?Health Maintenance: ?Pap: 02/25/21 WNL 01/16/16 WNL ?History of abnormal Pap:  no ?MMG: 11/15/19 density C Bi-rads 1 neg  ?BMD:  07/20/2003  ?Colonoscopy:07/20/2003 WNL, Cologuard 12/17/2018 Negative  ?TDaP:  09/02/2009  ?Gardasil: NA ? ? reports that she quit smoking about 5 years ago. Her smoking use included cigarettes. She started smoking about 58 years ago. She has a 52.00 pack-year smoking history. She has never used smokeless tobacco. She reports that she does not currently use alcohol. She reports that she does not currently use drugs.She is having one wine cooler a day.  ? ?Past Medical History:  ?Diagnosis Date  ? ALLERGIC RHINITIS 05/01/2007  ? Anxiety   ? Arthritis   ? "hands" (12/28/2015)  ? ASTHMA 05/01/2007  ? "since I was a child"  ? Cancer [facility])   ? LUNG  ? CHF (congestive heart failure) ([place])   ? Chronic bronchitis ([place])   ? COPD (chronic obstructive pulmonary disease) ([place])   ? DEPRESSION 07/21/2008  ? DVT (deep venous thrombosis) ([place]) 1960s  ? LLE  ? FATIGUE 10/10/2010  ? HYPERTENSION 05/01/2007  ? HYPOTHYROIDISM 08/23/2009  ? s/p RAI  ? LUMBAR RADICULOPATHY, RIGHT 08/25/2008  ? On home oxygen therapy   ? "3-4L; qd; all the time" (12/28/2015)  ? OSTEOPOROSIS 05/01/2007  ? SHOULDER PAIN, LEFT  08/23/2009  ? SINUSITIS, CHRONIC 10/10/2010  ? ? ?Past Surgical History:  ?Procedure Laterality Date  ? APPENDECTOMY    ? DILATION AND CURETTAGE OF UTERUS  multiple  ? history of multiple dialations and curettages and miscarriages, unfortunately never carrying a child to term  ? ECTOPIC PREGNANCY SURGERY  "late '60s or early '70s  ? ELECTROCARDIOGRAM  06/20/2006  ? OOPHORECTOMY Right   ? ? ?Current Outpatient Medications  ?Medication Sig Dispense Refill  ? albuterol (VENTOLIN HFA) 108 (90 Base) MCG/ACT inhaler INHALE 1 TO 2 PUFFS INTO THE LUNGS EVERY 6 HOURS AS NEEDED FOR WHEEZING OR SHORTNESS OF BREATH 18 g 4  ? aspirin EC 81 MG EC tablet Take 1 tablet (81 mg total) by mouth daily. 30 tablet 0  ? atorvastatin (LIPITOR) 10 MG tablet TAKE 1 TABLET BY MOUTH DAILY. (Patient taking differently: Take 10 mg by mouth daily.) 90 tablet 1  ? citalopram (CELEXA) 20 MG tablet TAKE ONE TABLET BY MOUTH DAILY (Patient taking differently: Take 20 mg by mouth daily. TAKE ONE TABLET BY MOUTH DAILY) 90 tablet 3  ? Fluticasone-Umeclidin-Vilant (TRELEGY ELLIPTA) 200-62.5-25 MCG/INH AEPB Inhale 1 puff into the lungs daily. 60 each 11  ? furosemide (LASIX) 40 MG tablet 1 tab by mouth twice per day (Patient taking differently: Take 40 mg by mouth 2 (two) times daily.) 180  tablet 3  ? ipratropium-albuterol (DUONEB) 0.5-2.5 (3) MG/3ML SOLN Take 3 mLs by nebulization every 6 (six) hours as needed. 360 mL 11  ? levothyroxine (SYNTHROID) 75 MCG tablet Take 1 tablet (75 mcg total) by mouth daily before breakfast. TAKE ONE TABLET BY MOUTH EARLY MORNING 90 tablet 3  ? OXYGEN Inhale 3 L into the lungs continuous. 24/7    ? potassium chloride (KLOR-CON) 10 MEQ tablet TAKE 1 TABLET BY MOUTH 2 TIMES DAILY. (Patient taking differently: Take 10 mEq by mouth 2 (two) times daily.) 180 tablet 3  ? guaiFENesin (ROBITUSSIN) 100 MG/5ML liquid Take 5 mLs by mouth every 4 (four) hours as needed for cough or to loosen phlegm. Mucinex chest congestion (liquid)  (Patient not taking: Reported on 03/05/2022)    ? ?No current facility-administered medications for this visit.  ? ? ?Family History  ?Problem Relation Age of Onset  ? Lung cancer Father   ? Alcohol abuse Brother   ? Diabetes Brother   ? Hypertension Other   ? Stroke Mother   ? Thyroid disease Mother   ? Asthma Other   ?     maternal aunts  ? Breast cancer Neg Hx   ? ? ?Review of Systems  ?All other systems reviewed and are negative. ? ?Exam:   ?BP 122/70   Pulse 99   Ht 5' (1.524 m)   Wt 115 lb (52.2 kg)   SpO2 95%   BMI 22.46 kg/m?   Weight change: '@WEIGHTCHANGE'$ @ Height:   Height: 5' (152.4 cm)  ?Ht Readings from Last 3 Encounters:  ?03/05/22 5' (1.524 m)  ?02/25/22 5' (1.524 m)  ?02/25/22 5' (1.524 m)  ? ? ?General appearance: alert, cooperative and appears stated age ?Head: Normocephalic, without obvious abnormality, atraumatic ?Neck: no adenopathy, supple, symmetrical, trachea midline and thyroid normal to inspection and palpation ?Lungs: clear to auscultation bilaterally ?Cardiovascular: regular rate and rhythm ?Breasts: normal appearance, no masses or tenderness ?Abdomen: soft, non-tender; non distended,  no masses,  no organomegaly ?Extremities: extremities normal, atraumatic, no cyanosis or edema ?Skin: Skin color, texture, turgor normal. No rashes or lesions ?Lymph nodes: Cervical, supraclavicular, and axillary nodes normal. ?No abnormal inguinal nodes palpated ?Neurologic: Grossly normal ? ? ?Pelvic: External genitalia:  no lesions ?             Urethra:  normal appearing urethra with no masses, tenderness or lesions ?             Bartholins and Skenes: normal    ?             Vagina: normal appearing vagina with normal color and discharge, no lesions ?             Cervix: no lesions ?              ?Bimanual Exam:  Uterus:   no masses or tenderness ?             Adnexa: no mass, fullness, tenderness ?              Rectovaginal: Confirms ?              Anus:  normal sphincter tone, no lesions ? ?Gae Dry chaperoned for the exam. ? ?1. Encounter for breast and pelvic examination ?Normal exam ? ?2. Postmenopausal bleeding ?She reports intermittent brown spotting, no brown d/c noted on exam today ?- US PELVIS TRANSVAGINAL NON-OB (TV ONLY); Future ? ? ? ? ?

## 2022-03-01 ENCOUNTER — Encounter: Payer: Self-pay | Admitting: Internal Medicine

## 2022-03-01 DIAGNOSIS — E559 Vitamin D deficiency, unspecified: Secondary | ICD-10-CM | POA: Insufficient documentation

## 2022-03-01 NOTE — Assessment & Plan Note (Signed)
Lab Results  ?Component Value Date  ? HGBA1C 6.5 (A) 02/25/2022  ? ?Stable, pt to continue current medical treatment  - diet ? ?

## 2022-03-01 NOTE — Assessment & Plan Note (Signed)
Lab Results  ?Component Value Date  ? TSH 1.22 02/25/2022  ? ?Stable, pt to continue levothyroxine ? ?

## 2022-03-01 NOTE — Assessment & Plan Note (Signed)
Mild, for prednisone taper,  to f/u any worsening symptoms or concerns  

## 2022-03-01 NOTE — Assessment & Plan Note (Signed)
BP Readings from Last 3 Encounters:  ?02/25/22 114/70  ?02/25/22 116/60  ?12/05/21 110/60  ? ?Stable, pt to continue medical treatment  - low salt diet, wt control ? ?

## 2022-03-01 NOTE — Assessment & Plan Note (Signed)
Last vitamin D ?Lab Results  ?Component Value Date  ? VD25OH 19.39 (L) 02/25/2022  ? ?Low, to start oral replacement ? ?

## 2022-03-01 NOTE — Assessment & Plan Note (Signed)
Age and sex appropriate education and counseling updated with regular exercise and diet ?Referrals for preventative services - plans to see optho soon, declines colonoscopy ?Immunizations addressed - declines tdap, shingrix ?Smoking counseling  - none needed ?Evidence for depression or other mood disorder - none significant ?Most recent labs reviewed. ?I have personally reviewed and have noted: ?1) the patient's medical and social history ?2) The patient's current medications and supplements ?3) The patient's height, weight, and BMI have been recorded in the chart ? ?

## 2022-03-04 ENCOUNTER — Telehealth: Payer: Self-pay | Admitting: Internal Medicine

## 2022-03-04 NOTE — Telephone Encounter (Signed)
Tried returning patients call  no answer ? ?Patient left message wanting a call back ?She has questions about her appt with Dr Jasmine [name] she stated he was supposed to come to her home.   ? ?She wanted to confirm date and what the appointment was about ?

## 2022-03-05 ENCOUNTER — Ambulatory Visit (INDEPENDENT_AMBULATORY_CARE_PROVIDER_SITE_OTHER): Payer: Medicare HMO | Admitting: Obstetrics and Gynecology

## 2022-03-05 ENCOUNTER — Other Ambulatory Visit: Payer: Self-pay

## 2022-03-05 ENCOUNTER — Encounter: Payer: Self-pay | Admitting: Obstetrics and Gynecology

## 2022-03-05 VITALS — BP 122/70 | HR 99 | Ht 60.0 in | Wt 115.0 lb

## 2022-03-05 DIAGNOSIS — Z01419 Encounter for gynecological examination (general) (routine) without abnormal findings: Secondary | ICD-10-CM

## 2022-03-05 DIAGNOSIS — N950 Postmenopausal bleeding: Secondary | ICD-10-CM | POA: Diagnosis not present

## 2022-03-11 ENCOUNTER — Ambulatory Visit (INDEPENDENT_AMBULATORY_CARE_PROVIDER_SITE_OTHER): Payer: Medicare HMO | Admitting: *Deleted

## 2022-03-11 ENCOUNTER — Ambulatory Visit: Payer: TRICARE For Life (TFL) | Admitting: Gastroenterology

## 2022-03-11 ENCOUNTER — Telehealth: Payer: Self-pay | Admitting: Gastroenterology

## 2022-03-11 DIAGNOSIS — J9611 Chronic respiratory failure with hypoxia: Secondary | ICD-10-CM

## 2022-03-11 DIAGNOSIS — I5032 Chronic diastolic (congestive) heart failure: Secondary | ICD-10-CM

## 2022-03-11 NOTE — Chronic Care Management (AMB) (Signed)
Chronic Care Management   CCM RN Visit Note  03/11/2022 Name: Jasmine [name] MRN: [id number] DOB: 1946/05/12  Subjective: Jasmine [name] is a 76 y.o. year old female who is a primary care patient of Jasmine Borg, MD. The care management team was consulted for assistance with disease management and care coordination needs.    Engaged with patient by telephone for follow up visit in response to provider referral for case management and/or care coordination services.   Consent to Services:  The patient was given information about Chronic Care Management services, agreed to services, and gave verbal consent prior to initiation of services.  Please see initial visit note for detailed documentation.  Patient agreed to services and verbal consent obtained.   Assessment: Review of patient past medical history, allergies, medications, health status, including review of consultants reports, laboratory and other test data, was performed as part of comprehensive evaluation and provision of chronic care management services.   SDOH (Social Determinants of Health) assessments and interventions performed:  SDOH Interventions    Flowsheet Row Most Recent Value  SDOH Interventions   Transportation Interventions Intervention Not Indicated  [employer] Guide provided resources,  currently she reports her family and roommate continue to assist with transportation- she has not yet utilized resources that were provided by care guide]     CCM Care Plan  Allergies  Allergen Reactions   Alendronate Sodium Other (See Comments)    Patient does not remember this reaction   Outpatient Encounter Medications as of 03/11/2022  Medication Sig   albuterol (VENTOLIN HFA) 108 (90 Base) MCG/ACT inhaler INHALE 1 TO 2 PUFFS INTO THE LUNGS EVERY 6 HOURS AS NEEDED FOR WHEEZING OR SHORTNESS OF BREATH   aspirin EC 81 MG EC tablet Take 1 tablet (81 mg total) by mouth daily.   atorvastatin (LIPITOR)  10 MG tablet TAKE 1 TABLET BY MOUTH DAILY. (Patient taking differently: Take 10 mg by mouth daily.)   citalopram (CELEXA) 20 MG tablet TAKE ONE TABLET BY MOUTH DAILY (Patient taking differently: Take 20 mg by mouth daily. TAKE ONE TABLET BY MOUTH DAILY)   Fluticasone-Umeclidin-Vilant (TRELEGY ELLIPTA) 200-62.5-25 MCG/INH AEPB Inhale 1 puff into the lungs daily.   furosemide (LASIX) 40 MG tablet 1 tab by mouth twice per day (Patient taking differently: Take 40 mg by mouth 2 (two) times daily.)   guaiFENesin (ROBITUSSIN) 100 MG/5ML liquid Take 5 mLs by mouth every 4 (four) hours as needed for cough or to loosen phlegm. Mucinex chest congestion (liquid) (Patient not taking: Reported on 03/05/2022)   ipratropium-albuterol (DUONEB) 0.5-2.5 (3) MG/3ML SOLN Take 3 mLs by nebulization every 6 (six) hours as needed.   levothyroxine (SYNTHROID) 75 MCG tablet Take 1 tablet (75 mcg total) by mouth daily before breakfast. TAKE ONE TABLET BY MOUTH EARLY MORNING   OXYGEN Inhale 3 L into the lungs continuous. 24/7   potassium chloride (KLOR-CON) 10 MEQ tablet TAKE 1 TABLET BY MOUTH 2 TIMES DAILY. (Patient taking differently: Take 10 mEq by mouth 2 (two) times daily.)   No facility-administered encounter medications on file as of 03/11/2022.   Patient Active Problem List   Diagnosis Date Noted   Vitamin D deficiency 03/01/2022   Physical deconditioning 12/05/2021   COPD exacerbation ([place]) 07/30/2021   Diabetes mellitus due to underlying condition without complication, without long-term current use of insulin ([place]) 07/22/2021   Steroid-dependent chronic obstructive pulmonary disease ([place]) 07/22/2021   Postablative hypothyroidism 07/22/2021   Encounter for well adult  exam with abnormal findings 04/20/2021   Malnutrition of moderate degree 08/16/2019   Respiratory acidosis 03/07/2019   Goals of care, counseling/discussion 09/20/2018   Edema due to congestive heart failure ([place]) 07/27/2018   Palliative care by  specialist    Shortness of breath    Chronic low back pain 04/30/2018   COPD with acute exacerbation ([place])    Hypothyroidism    Anxiety    Respiratory failure with hypercapnia ([place]) 03/24/2018   Hypercarbia 03/22/2018   Polysubstance abuse ([place]) 01/26/2018   Aspiration into airway 06/03/2017   Rash 07/12/2016   Bilateral hearing loss 06/05/2016   Hyperglycemia 05/21/2016   Abnormal LFTs 03/24/2016   Solitary pulmonary nodule 01/09/2016   Chronic respiratory failure with hypoxia ([place])    On home oxygen therapy    Chronic diastolic congestive heart failure ([place])    Bacteremia Step vir[facility]ans 12/27/2015   Former smoker 12/25/2015   Hypothyroidism following radioiodine therapy 09/26/2014   Protein-calorie malnutrition, severe ([place]) 06/23/2014   Cor pulmonale ([place]) 01/12/2013   COPD (chronic obstructive pulmonary disease) ([place]) 11/29/2011   Macrocytosis without anemia 11/09/2011   Palliative care encounter 04/09/2011   SINUSITIS, CHRONIC 10/10/2010   Fatigue 10/10/2010   Depression with anxiety 07/21/2008   Essential hypertension 05/01/2007   ALLERGIC RHINITIS 05/01/2007   Asthma 05/01/2007   OSTEOPOROSIS 05/01/2007   Conditions to be addressed/monitored:  CHF and COPD  Care Plan : RN Care Manager Plan of Care  Updates made by Knox Royalty, RN since 03/11/2022 12:00 AM     Problem: Chronic Disease Management Needs   Priority: High     Long-Range Goal: Ongoing adherence to established plan of care for long term chronic disease management   Start Date: 11/21/2021  Expected End Date: 11/21/2022  Priority: High  Note:   Current Barriers:  Chronic Disease Management support and education needs related to CHF and COPD Frequent falls- patient reports 2-plus recent falls, both without serious injury; reports uses walker "all the time" 03/11/22: denies new recent fall since our outreach on 12/03/21 Fragile state of health, multiple progressing severe chronic health conditions: on  continuous home O2 at 3 L/min Most recent hospitalization October 11-13, 2022 for acute on chronic respiratory failure/ CHF exacerbation Inconsistent adherence to attending provider appointments Cancelled post-hospital discharge provider office visits to pulmonary provider 10/18/21; no-show to PCP 10/07/22 Was previously active with outpatient palliative care team; palliative care case closure 05/16/21: they were unable to maintain contact with patient for ongoing palliative care services/ care SDOH concerns as follows: 12/03/21: confirmed [employer] has contacted/ spoken with patient and provided resources as requested Transportation: reports does not drive self; can usually depend on roommate (who happens to be her ex-husband) and/ or extended family ("nieces and nephews"), however, she reports, "they are not doing so good lately either;" she is uncertain if she will be able to rely on these family members in the future Finances and food acquisition, in light of current economy: reports does not qualify for food stamps or medicaid, "they say I make too much money" Home in need of repair: reports "front porch steps needs sturdy railing, and the steps need repair;" confirms she does have a ramp that she is able to use off the back entry Need for assistance with hygiene/ bathing, household management/ chores, shopping, and errands  RNCM Clinical Goal(s):  Patient will demonstrate improved health management independence as evidenced by adherence to plan of care for COPD; CHF  through collaboration with RN Care manager, provider, and care team.   Interventions: 1:1 collaboration with primary care provider regarding development and update of comprehensive plan of care as evidenced by provider attestation and co-signature Inter-disciplinary care team collaboration (see longitudinal plan of care) Evaluation of current treatment plan related to  self management and patient's  adherence to plan as established by provider 12/03/21: CCM RN CM initial assessment completed; SDOH  needs- confirmed [employer] has contacted patient and provided resources Review of patient status, including review of consultants reports, relevant laboratory and other test results, and medications completed SDOH updated: patient reports ongoing concerns around in-home care options; confirms she has not reached out to insurance provider to determine if there are any covered benefits for same: confirms she does not have medicaid; she reports she has heard from an "agency" that "will help with home repairs, however, she does not recall the agency name; states she has a letter that she received from agency; encouraged her to contact her insurance company, as she has numerous questions about specific coverage-- she agreed to contact them and to act on resources which have been provided by [employer] team-- we will re-visit again with our next scheduled outreach Pain assessment updated: reports intermittent non-specific "aches and pain" but denies pain today Falls assessment updated: denies new/ recent falls "since early winter, in January or February;" continues using walker "most" of the time; attributes previous falls to trying to take a shower alone- states she has since obtained a shower chair; interested in obtaining grab bars as well-- reports plans to discuss with the "agency" that she says "is going to help" her with home repairs: this was encouraged; I also suggested that in the mean time, she not shower or take a physical bath until her niece who assists in her care is physically present in the home with her to assist as needed; she has currently been bathing by "just getting washed up at the sink;"  positive reinforcement provided with encouragement to continue efforts at fall prevention; previously provided education around fall risks/ prevention  reinforced Medications discussed: reports continues to independently self-manage and denies current concerns/ issues/ questions around medications; endorses adherence to taking all medications as prescribed Reviewed recent provider appointments: 02/25/22- PCP and 02/25/22- endocrinology; patient verbalizes a good understanding of post-office visit instructions and denies questions; we reviewed recent lab results from provider office visits Reviewed upcoming scheduled provider appointments: 03/11/22- GI provider (need for colonoscopy); 04/29/22- gynecology provider; patient confirms is aware of all and has plans to attend as scheduled Discussed plans with patient for ongoing care management follow up and provided patient with direct contact information for care management team     Heart Failure/ COPD Interventions:  (Status: 03/11/22: Goal on Track (progressing): YES.)  Long Term Goal  Wt Readings from Last 3 Encounters:  03/05/22 115 lb (52.2 kg)  02/25/22 116 lb 12.8 oz (53 kg)  02/25/22 116 lb 12.8 oz (53 kg)  Reviewed role of diuretics in prevention of fluid overload and management of heart failure Discussed the importance of keeping all appointments with provider Advised patient to track and manage COPD triggers Provided instruction about proper use of medications used for management of COPD including inhalers Advised patient to self assesses COPD action plan zone and make appointment with provider if in the yellow zone for 48 hours without improvement Discussed the importance of adequate rest and management of fatigue with COPD  Confirmed patient continues  self-managing medications, denies concerns around medications Reports using rescue inhaler 1-2 times per day Reports using nebulizer 1-2 times per day Endorses adherence to use of maintenance inhaler (Trelegy) QD Reports continues using home O2 as prescribed; runs at 3-4 L/min Confirms [employer] came to her home "sometime in May" and serviced  both her home nebulizer and oxygen: reports now using nebulizer independently and without assistance Reports breathing at baseline today: states she continues using rescue inhaler and nebulizer daily and has been using a "fan" as well, which she again reports "helps make breathing easier" Reports baseline swelling of lower extremities without change: confirmed patient continues to endorse taking diuretic as prescribed; has been elevating legs, which she reports "seems to help with the swelling" she remains a fall risk and is unable to monitor daily weights at home Tells me that she has been paying out of pocket to have someone come and assist with house cleaning and vacuuming floors/ rugs-- states "that has really helped my breathing;" she was encouraged to continue this service  Patient Goals/Self-Care Activities: As evidenced by review of EHR, collaboration with care team, and patient reporting during CCM RN CM outreach,  Patient Kasi will: Take medications as prescribed Attend all scheduled provider appointments Call pharmacy for medication refills Call provider office for new concerns or questions Continue to use your oxygen, and your inhalers/ nebulizer as prescribed Contact your insurance provider to get your questions answered about their coverage for in-home care assistance Keep up the great work preventing recent falls- continue using your walker as needed    Plan: Telephone follow up appointment with care management team member scheduled for: Thursday, May 15, 2022 at 11:30 am The patient has been provided with contact information for the care management team and has been advised to call with any health related questions or concerns  [name], RN, BSN, [place] [phone]: direct office

## 2022-03-11 NOTE — Patient Instructions (Addendum)
Visit [place], thank you for taking time to talk with me today. Please don't hesitate to contact me if I can be of assistance to you before our next scheduled telephone appointment  Below are the goals we discussed today:  Patient Self-Care Activities: Patient Jasmine [name] will: Take medications as prescribed Attend all scheduled provider appointments Call pharmacy for medication refills Call provider office for new concerns or questions Continue to use your oxygen, and your inhalers/ nebulizer as prescribed Contact your insurance provider to get your questions answered about their coverage for in-home care assistance Keep up the great work preventing recent falls- continue using your walker as needed  Our next scheduled telephone follow up visit/ appointment is scheduled on: Thursday, May 15, 2022 at 11:30 am- This is a PHONE [place] appointment  If you need to cancel or re-schedule our visit, please call [phone] and our care guide team will be happy to assist you.   I look forward to hearing about your progress.   [name], RN, BSN, [place] [phone]: direct office  If you are experiencing a Mental Health or [place] or need someone to talk to, please  call the Suicide and Crisis Lifeline: 988 call the [place] National Suicide Prevention Lifeline: (385)203-9318 or TTY: 361-032-2559 TTY (985)393-2604) to talk to a trained counselor call 1-800-273-TALK (toll free, 24 hour hotline) go to [facility] [address] [place] [phone]) call 911   The patient verbalized understanding of instructions, educational materials, and care plan provided today and agreed to receive a mailed copy of patient instructions, educational materials, and care plan  Living with COPD Being diagnosed with chronic obstructive pulmonary disease (COPD) changes your  life physically and emotionally. Having COPD can affect your ability to work and do things you enjoy. COPD is not the same for everyone, and it may change over time. Your health care providers can help you come up with the COPD management plan that works best for you. How to manage lifestyle changes Treatment plan Work closely with your health care providers. Follow your COPD management plan. This plan includes: Instructions about activities, exercises, diet, medicines, what to do when COPD flares up, and when to call your health care provider. A pulmonary rehabilitation program. In pulmonary rehab, you will learn about COPD, do exercises for fitness and breathing, and get support from health care providers and other people who have COPD. Managing emotions and stress Living with a chronic disease means you may also struggle with stressful emotions, such as sadness, fear, and worry. Here are some ways to manage these emotions: Talk to someone about your fear, anxiety, depression, or stress. Learn strategies to avoid or reduce stress and ask for help if you are struggling with depression or anxiety. Consider joining a COPD support group, online or in person.  Adjusting to changes COPD may limit the things you can do, but you can make certain changes to help you cope with the diagnosis. Ask for help when you need it. Getting support from friends, family, and your health care team is an important part of managing the condition. Try to get regular exercise as prescribed by a health care provider or pulmonary rehab team. Exercising can help COPD, even if you are a bit short of breath. Take steps to prevent infection and protect your lungs: Wash your hands often and avoid being in crowds. Stay away from friends and  family members who are sick. Check your local air quality each day, and stay out of areas where air pollution is likely. How to recognize changes in your condition Recognizing changes in  your COPD COPD is a progressive disease. It is important to let the health care team know if your COPD is getting worse. Your treatment plan may need to change. Watch for: Increased shortness of breath, wheezing, cough, or fatigue. Loss of ability to exercise or perform daily activities, like climbing stairs. More frequent symptom flares. Signs of depression or anxiety. Recognizing stress It is normal to have additional stress when you have COPD. However, prolonged stress and anxiety can make COPD worse and lead to depression. Recognize the warning signs, which include: Feeling sad or worried more often or most of the time. Having less energy and losing interest in pleasurable activities. Changes in your appetite or sleeping patterns. Being easily angered or irritated. Having unexplained aches and pains, digestive problems, or headaches. Follow these instructions at home: Eating and drinking  Eat foods that are high in fiber, such as fresh fruits and vegetables, whole grains, and beans. Limit foods that are high in fat and processed sugars, such as fried or sweet foods. Follow a balanced diet and maintain a healthy weight. Being overweight or underweight can make COPD worse. You may work with a Microbiologist as part of your pulmonary rehab program. Drink enough fluid to keep your urine pale yellow. If you drink alcohol: Limit how much you have to: 0-1 drink a day for women who are not pregnant. 0-2 drinks a day for men. Know how much alcohol is in your drink. In the [place] one drink equals one 12 oz bottle of beer (355 mL), one 5 oz glass of wine (148 mL), or one 1 oz glass of hard liquor (44 mL). Lifestyle If you smoke, the most important thing that you can do is to stop smoking. Continuing to smoke will cause the disease to progress faster. Do not use any products that contain nicotine or tobacco. These products include cigarettes, chewing tobacco, and vaping devices, such as e-cigarettes. If  you need help quitting, ask your health care provider. Avoid exposure to things that irritate your lungs, such as smoke, chemicals, and fumes. Activity Balance exercise and rest. Take short walks every 1-2 hours. This is important to improve blood flow and breathing. Ask for help if you feel weak or unsteady. Do exercises that include controlled breathing with body movement, such as tai chi. General instructions Take over-the-counter and prescription medicines only as told by your health care provider. Take vitamin and protein supplements as told by your health care provider or dietitian. Practice good oral hygiene and see your dental care provider regularly. An oral infection can also spread to your lungs. Make sure you receive all the vaccines that your health care provider recommends. Keep all follow-up visits. This is important. Contact a health care provider if you: Are struggling to manage your COPD. Have emotional stress that interferes with your ability to cope with COPD. Get help right away if you: Have thoughts of suicide, death, or hurting yourself or others. If you ever feel like you may hurt yourself or others, or have thoughts about taking your own life, get help right away. Go to your nearest emergency department or: Call your local emergency services (911 in the [place] Call a suicide crisis helpline, such as the [place] at (331) 059-9816 or 988 in the [place]. This is open  24 hours a day in the [place] Text the Crisis Text Line at 787-454-2677 (in the [place].). Summary Being diagnosed with chronic obstructive pulmonary disease (COPD) changes your life physically and emotionally. Work with your health care providers and follow your COPD management plan. A pulmonary rehabilitation program is an important part of COPD management. Prolonged stress, anxiety, and depression can make COPD worse. Let your health care provider know if emotional stress interferes with your  ability to cope with and manage COPD. This information is not intended to replace advice given to you by your health care provider. Make sure you discuss any questions you have with your health care provider. Document Revised: 05/01/2021 Document Reviewed: 10/24/2020 Elsevier Patient Education  [place].

## 2022-03-11 NOTE — Telephone Encounter (Signed)
Good Afternoon Dr. Rush Landmark,  Patient called and stated that she missed her appointment today with you 1:50 due to her transportation not picking her up in time.   Patient was rescheduled for 6/21 at 3:10

## 2022-03-12 NOTE — Telephone Encounter (Signed)
Sorry to hear about that. Thanks for update. GM

## 2022-03-19 DIAGNOSIS — I509 Heart failure, unspecified: Secondary | ICD-10-CM

## 2022-03-19 DIAGNOSIS — Z87891 Personal history of nicotine dependence: Secondary | ICD-10-CM

## 2022-03-19 DIAGNOSIS — J449 Chronic obstructive pulmonary disease, unspecified: Secondary | ICD-10-CM

## 2022-03-25 ENCOUNTER — Telehealth: Payer: Self-pay | Admitting: Pulmonary Disease

## 2022-03-25 DIAGNOSIS — J4520 Mild intermittent asthma, uncomplicated: Secondary | ICD-10-CM

## 2022-03-26 NOTE — Telephone Encounter (Signed)
Called patient this morning and she states that she got a letter stating that Jasmine [name] is no longer in net work for her and that now she has to use [employer].   Are you ok for [place] to send an order to change everything she has over to [employer] sir? She has oxygen and cpap

## 2022-03-26 NOTE — Telephone Encounter (Signed)
Called patient and left voicemail that we were in the process of getting everything switched over from [place] to [employer] since its due to insurance reasons. And to call with any questions or concerns.

## 2022-04-09 ENCOUNTER — Ambulatory Visit: Payer: TRICARE For Life (TFL) | Admitting: Gastroenterology

## 2022-04-13 DIAGNOSIS — R609 Edema, unspecified: Secondary | ICD-10-CM | POA: Diagnosis not present

## 2022-04-13 DIAGNOSIS — I959 Hypotension, unspecified: Secondary | ICD-10-CM | POA: Diagnosis not present

## 2022-04-14 ENCOUNTER — Telehealth: Payer: Self-pay | Admitting: Internal Medicine

## 2022-04-14 MED ORDER — ALBUTEROL SULFATE HFA 108 (90 BASE) MCG/ACT IN AERS: INHALATION_SPRAY | RESPIRATORY_TRACT | 4 refills | Status: DC

## 2022-04-27 ENCOUNTER — Other Ambulatory Visit: Payer: Self-pay

## 2022-04-27 ENCOUNTER — Emergency Department (HOSPITAL_COMMUNITY): Payer: Medicare HMO

## 2022-04-27 ENCOUNTER — Encounter (HOSPITAL_COMMUNITY): Payer: Self-pay | Admitting: Emergency Medicine

## 2022-04-27 ENCOUNTER — Inpatient Hospital Stay (HOSPITAL_COMMUNITY)
Admission: EM | Admit: 2022-04-27 | Discharge: 2022-05-01 | DRG: 190 | Disposition: A | Discharge status: Home Health | Payer: Medicare HMO | Attending: Internal Medicine | Admitting: Internal Medicine

## 2022-04-27 DIAGNOSIS — R7881 Bacteremia: Secondary | ICD-10-CM | POA: Diagnosis not present

## 2022-04-27 DIAGNOSIS — J9622 Acute and chronic respiratory failure with hypercapnia: Secondary | ICD-10-CM | POA: Diagnosis present

## 2022-04-27 DIAGNOSIS — Z79899 Other long term (current) drug therapy: Secondary | ICD-10-CM

## 2022-04-27 DIAGNOSIS — J449 Chronic obstructive pulmonary disease, unspecified: Secondary | ICD-10-CM | POA: Diagnosis not present

## 2022-04-27 DIAGNOSIS — Z6822 Body mass index (BMI) 22.0-22.9, adult: Secondary | ICD-10-CM

## 2022-04-27 DIAGNOSIS — Z833 Family history of diabetes mellitus: Secondary | ICD-10-CM | POA: Diagnosis not present

## 2022-04-27 DIAGNOSIS — J441 Chronic obstructive pulmonary disease with (acute) exacerbation: Principal | ICD-10-CM | POA: Diagnosis present

## 2022-04-27 DIAGNOSIS — E440 Moderate protein-calorie malnutrition: Secondary | ICD-10-CM | POA: Diagnosis not present

## 2022-04-27 DIAGNOSIS — Z7982 Long term (current) use of aspirin: Secondary | ICD-10-CM

## 2022-04-27 DIAGNOSIS — G4733 Obstructive sleep apnea (adult) (pediatric): Secondary | ICD-10-CM | POA: Diagnosis present

## 2022-04-27 DIAGNOSIS — T66XXXS Radiation sickness, unspecified, sequela: Secondary | ICD-10-CM

## 2022-04-27 DIAGNOSIS — I5032 Chronic diastolic (congestive) heart failure: Secondary | ICD-10-CM | POA: Diagnosis not present

## 2022-04-27 DIAGNOSIS — Z66 Do not resuscitate: Secondary | ICD-10-CM | POA: Diagnosis present

## 2022-04-27 DIAGNOSIS — J9811 Atelectasis: Secondary | ICD-10-CM | POA: Diagnosis present

## 2022-04-27 DIAGNOSIS — T486X6A Underdosing of antiasthmatics, initial encounter: Secondary | ICD-10-CM | POA: Diagnosis present

## 2022-04-27 DIAGNOSIS — R0602 Shortness of breath: Secondary | ICD-10-CM | POA: Diagnosis not present

## 2022-04-27 DIAGNOSIS — Z91198 Patient's noncompliance with other medical treatment and regimen for other reason: Secondary | ICD-10-CM

## 2022-04-27 DIAGNOSIS — Z79890 Hormone replacement therapy (postmenopausal): Secondary | ICD-10-CM | POA: Diagnosis not present

## 2022-04-27 DIAGNOSIS — R079 Chest pain, unspecified: Secondary | ICD-10-CM | POA: Diagnosis not present

## 2022-04-27 DIAGNOSIS — Z85118 Personal history of other malignant neoplasm of bronchus and lung: Secondary | ICD-10-CM | POA: Diagnosis not present

## 2022-04-27 DIAGNOSIS — R911 Solitary pulmonary nodule: Secondary | ICD-10-CM | POA: Diagnosis not present

## 2022-04-27 DIAGNOSIS — I110 Hypertensive heart disease with heart failure: Secondary | ICD-10-CM | POA: Diagnosis not present

## 2022-04-27 DIAGNOSIS — E890 Postprocedural hypothyroidism: Secondary | ICD-10-CM | POA: Diagnosis present

## 2022-04-27 DIAGNOSIS — Z515 Encounter for palliative care: Secondary | ICD-10-CM

## 2022-04-27 DIAGNOSIS — F419 Anxiety disorder, unspecified: Secondary | ICD-10-CM | POA: Diagnosis present

## 2022-04-27 DIAGNOSIS — E089 Diabetes mellitus due to underlying condition without complications: Secondary | ICD-10-CM | POA: Diagnosis present

## 2022-04-27 DIAGNOSIS — Z8249 Family history of ischemic heart disease and other diseases of the circulatory system: Secondary | ICD-10-CM

## 2022-04-27 DIAGNOSIS — R0609 Other forms of dyspnea: Secondary | ICD-10-CM | POA: Diagnosis not present

## 2022-04-27 DIAGNOSIS — I700 Atherosclerosis of aorta: Secondary | ICD-10-CM | POA: Diagnosis present

## 2022-04-27 DIAGNOSIS — R0902 Hypoxemia: Secondary | ICD-10-CM | POA: Diagnosis not present

## 2022-04-27 DIAGNOSIS — E119 Type 2 diabetes mellitus without complications: Secondary | ICD-10-CM | POA: Diagnosis present

## 2022-04-27 DIAGNOSIS — J439 Emphysema, unspecified: Secondary | ICD-10-CM | POA: Diagnosis not present

## 2022-04-27 DIAGNOSIS — E039 Hypothyroidism, unspecified: Secondary | ICD-10-CM | POA: Diagnosis present

## 2022-04-27 DIAGNOSIS — F32A Depression, unspecified: Secondary | ICD-10-CM | POA: Diagnosis present

## 2022-04-27 DIAGNOSIS — I2781 Cor pulmonale (chronic): Secondary | ICD-10-CM | POA: Diagnosis not present

## 2022-04-27 DIAGNOSIS — J9621 Acute and chronic respiratory failure with hypoxia: Secondary | ICD-10-CM | POA: Diagnosis present

## 2022-04-27 DIAGNOSIS — I10 Essential (primary) hypertension: Secondary | ICD-10-CM | POA: Diagnosis present

## 2022-04-27 DIAGNOSIS — J9612 Chronic respiratory failure with hypercapnia: Secondary | ICD-10-CM | POA: Diagnosis not present

## 2022-04-27 DIAGNOSIS — Z888 Allergy status to other drugs, medicaments and biological substances status: Secondary | ICD-10-CM | POA: Diagnosis not present

## 2022-04-27 DIAGNOSIS — Z9981 Dependence on supplemental oxygen: Secondary | ICD-10-CM | POA: Diagnosis not present

## 2022-04-27 DIAGNOSIS — Z86718 Personal history of other venous thrombosis and embolism: Secondary | ICD-10-CM

## 2022-04-27 DIAGNOSIS — E785 Hyperlipidemia, unspecified: Secondary | ICD-10-CM | POA: Diagnosis present

## 2022-04-27 DIAGNOSIS — F418 Other specified anxiety disorders: Secondary | ICD-10-CM | POA: Diagnosis present

## 2022-04-27 DIAGNOSIS — R062 Wheezing: Secondary | ICD-10-CM | POA: Diagnosis not present

## 2022-04-27 DIAGNOSIS — Z825 Family history of asthma and other chronic lower respiratory diseases: Secondary | ICD-10-CM

## 2022-04-27 DIAGNOSIS — Z823 Family history of stroke: Secondary | ICD-10-CM

## 2022-04-27 DIAGNOSIS — R262 Difficulty in walking, not elsewhere classified: Secondary | ICD-10-CM | POA: Diagnosis present

## 2022-04-27 DIAGNOSIS — Z87891 Personal history of nicotine dependence: Secondary | ICD-10-CM | POA: Diagnosis not present

## 2022-04-27 DIAGNOSIS — R0982 Postnasal drip: Secondary | ICD-10-CM | POA: Diagnosis present

## 2022-04-27 DIAGNOSIS — Z7189 Other specified counseling: Secondary | ICD-10-CM | POA: Diagnosis not present

## 2022-04-27 DIAGNOSIS — R069 Unspecified abnormalities of breathing: Secondary | ICD-10-CM | POA: Diagnosis not present

## 2022-04-27 DIAGNOSIS — B9561 Methicillin susceptible Staphylococcus aureus infection as the cause of diseases classified elsewhere: Secondary | ICD-10-CM | POA: Diagnosis not present

## 2022-04-27 DIAGNOSIS — Z801 Family history of malignant neoplasm of trachea, bronchus and lung: Secondary | ICD-10-CM | POA: Diagnosis not present

## 2022-04-27 DIAGNOSIS — I491 Atrial premature depolarization: Secondary | ICD-10-CM | POA: Diagnosis not present

## 2022-04-27 DIAGNOSIS — Z20822 Contact with and (suspected) exposure to covid-19: Secondary | ICD-10-CM | POA: Diagnosis present

## 2022-04-27 DIAGNOSIS — I959 Hypotension, unspecified: Secondary | ICD-10-CM | POA: Diagnosis not present

## 2022-04-27 DIAGNOSIS — Z8349 Family history of other endocrine, nutritional and metabolic diseases: Secondary | ICD-10-CM

## 2022-04-27 DIAGNOSIS — R627 Adult failure to thrive: Secondary | ICD-10-CM | POA: Diagnosis present

## 2022-04-27 LAB — CBC WITH DIFFERENTIAL/PLATELET
Abs Immature Granulocytes: 0.02 10*3/uL (ref 0.00–0.07)
Basophils Absolute: 0 10*3/uL (ref 0.0–0.1)
Basophils Relative: 1 %
Eosinophils Absolute: 0.3 10*3/uL (ref 0.0–0.5)
Eosinophils Relative: 5 %
HCT: 44.8 % (ref 36.0–46.0)
Hemoglobin: 13.3 g/dL (ref 12.0–15.0)
Immature Granulocytes: 0 %
Lymphocytes Relative: 23 %
Lymphs Abs: 1.5 10*3/uL (ref 0.7–4.0)
MCH: 30.6 pg (ref 26.0–34.0)
MCHC: 29.7 g/dL — ABNORMAL LOW (ref 30.0–36.0)
MCV: 103.2 fL — ABNORMAL HIGH (ref 80.0–100.0)
Monocytes Absolute: 0.7 10*3/uL (ref 0.1–1.0)
Monocytes Relative: 11 %
Neutro Abs: 3.9 10*3/uL (ref 1.7–7.7)
Neutrophils Relative %: 60 %
Platelets: 149 10*3/uL — ABNORMAL LOW (ref 150–400)
RBC: 4.34 MIL/uL (ref 3.87–5.11)
RDW: 13.6 % (ref 11.5–15.5)
WBC: 6.4 10*3/uL (ref 4.0–10.5)
nRBC: 0 % (ref 0.0–0.2)

## 2022-04-27 LAB — BLOOD GAS, VENOUS
Acid-Base Excess: 17.2 mmol/L — ABNORMAL HIGH (ref 0.0–2.0)
Bicarbonate: 49.3 mmol/L — ABNORMAL HIGH (ref 20.0–28.0)
O2 Saturation: 79.1 %
Patient temperature: 37
pCO2, Ven: 105 mmHg (ref 44–60)
pH, Ven: 7.28 (ref 7.25–7.43)
pO2, Ven: 49 mmHg — ABNORMAL HIGH (ref 32–45)

## 2022-04-27 LAB — COMPREHENSIVE METABOLIC PANEL
ALT: 22 U/L (ref 0–44)
AST: 34 U/L (ref 15–41)
Albumin: 4.2 g/dL (ref 3.5–5.0)
Alkaline Phosphatase: 57 U/L (ref 38–126)
Anion gap: 9 (ref 5–15)
BUN: 14 mg/dL (ref 8–23)
CO2: 42 mmol/L — ABNORMAL HIGH (ref 22–32)
Calcium: 9.7 mg/dL (ref 8.9–10.3)
Chloride: 91 mmol/L — ABNORMAL LOW (ref 98–111)
Creatinine, Ser: 0.68 mg/dL (ref 0.44–1.00)
GFR, Estimated: >60 mL/min (ref >60)
Glucose, Bld: 103 mg/dL — ABNORMAL HIGH (ref 70–99)
Potassium: 4 mmol/L (ref 3.5–5.1)
Sodium: 142 mmol/L (ref 135–145)
Total Bilirubin: 1 mg/dL (ref 0.3–1.2)
Total Protein: 7.9 g/dL (ref 6.5–8.1)

## 2022-04-27 LAB — TROPONIN I (HIGH SENSITIVITY)
Troponin I (High Sensitivity): 8 ng/L (ref <18)
Troponin I (High Sensitivity): 8 ng/L (ref <18)

## 2022-04-27 LAB — BRAIN NATRIURETIC PEPTIDE: B Natriuretic Peptide: 16.6 pg/mL (ref 0.0–100.0)

## 2022-04-27 MED ORDER — PREDNISONE 20 MG PO TABS
60.0000 mg | ORAL_TABLET | Freq: Once | ORAL | Status: AC
  Administered 2022-04-27: 60 mg via ORAL
  Filled 2022-04-27: qty 3

## 2022-04-27 MED ORDER — IPRATROPIUM BROMIDE 0.02 % IN SOLN
0.5000 mg | Freq: Once | RESPIRATORY_TRACT | Status: AC
  Administered 2022-04-28: 0.5 mg via RESPIRATORY_TRACT
  Filled 2022-04-27: qty 2.5

## 2022-04-27 MED ORDER — SODIUM CHLORIDE (PF) 0.9 % IJ SOLN
INTRAMUSCULAR | Status: AC
  Filled 2022-04-27: qty 50

## 2022-04-27 MED ORDER — INSULIN ASPART 100 UNIT/ML IJ SOLN
0.0000 [IU] | Freq: Three times a day (TID) | INTRAMUSCULAR | Status: DC
  Administered 2022-04-28 (×2): 2 [IU] via SUBCUTANEOUS
  Administered 2022-04-29: 1 [IU] via SUBCUTANEOUS
  Administered 2022-04-29: 3 [IU] via SUBCUTANEOUS
  Administered 2022-04-29: 1 [IU] via SUBCUTANEOUS
  Administered 2022-04-30: 7 [IU] via SUBCUTANEOUS
  Administered 2022-04-30: 2 [IU] via SUBCUTANEOUS
  Filled 2022-04-27: qty 0.09

## 2022-04-27 MED ORDER — IOHEXOL 350 MG/ML SOLN
75.0000 mL | Freq: Once | INTRAVENOUS | Status: AC | PRN
  Administered 2022-04-27: 75 mL via INTRAVENOUS

## 2022-04-27 MED ORDER — ALBUTEROL SULFATE (2.5 MG/3ML) 0.083% IN NEBU
5.0000 mg | INHALATION_SOLUTION | Freq: Once | RESPIRATORY_TRACT | Status: AC
  Administered 2022-04-28: 5 mg via RESPIRATORY_TRACT
  Filled 2022-04-27: qty 6

## 2022-04-27 NOTE — Assessment & Plan Note (Signed)
Continue lasix 40 mg po BID and obtain echo Troponin stable

## 2022-04-27 NOTE — H&P (Incomplete)
Jasmine [name] SNK:[id number] DOB: 1946/02/01 DOA: 04/27/2022     PCP: Biagio Borg, MD   Outpatient Specialists:   CARDS: * Dr.   Merton Border  Endocrinology Shamleffer, Melanie Crazier, MD GI Dr. Rush Landmark, Telford Nab., MD [place], [place])      Patient arrived to ER on 04/27/22 at 1616 Referred by Attending Blanchie Dessert, MD   Patient coming from:    home Lives alone,     Chief Complaint:   Chief Complaint  Patient presents with  . Shortness of Breath    HPI: Jasmine [name] is a 76 y.o. female with medical history significant of end- stage COPD on 4L, DM2, osteoporosis, hypothyrodism Graves' Disease many years ago, she is S/P RAI ablation in 2006 , HLD,    Presented with   worsening shortness of breath Known hx of COPD on chronic O2 4L Run out of he rescue inhaler Was unable to get it today started to get progressively more short of breath and called 911  She used to wear bipAP but it got recalled  Can hardly walk now without getting short of breath  Pt reports leg edema for some time Has to sleep on a couch Regarding pertinent Chronic problems:     Hyperlipidemia -  on statins Lipitor (atorvastatin)  Lipid Panel     Component Value Date/Time   CHOL 188 02/25/2022 1549   TRIG 44.0 02/25/2022 1549   HDL 102.60 02/25/2022 1549   CHOLHDL 2 02/25/2022 1549   VLDL 8.8 02/25/2022 1549   LDLCALC 76 02/25/2022 1549   LDLCALC 89 06/08/2020 1555   LDLDIRECT 102.1 10/10/2010 1045        chronic CHF diastolic - last echo October 2022 Preserved EF  On lasix 40 mg BID *** CAD  - On Aspirin, statin, betablocker, Plavix                 - *followed by cardiology                - last cardiac cath  The ASCVD Risk score (Arnett DK, et al., 2019) failed to calculate for the following reasons:   The valid HDL cholesterol range is 20 to 100 mg/dL     DM 2 -  Lab Results  Component Value Date   HGBA1C 6.5 (A) 02/25/2022    diet controlled    Hypothyroidism:  Lab Results  Component Value Date   TSH 1.22 02/25/2022   on synthroid       COPD -  followed by pulmonology  on baseline oxygen  4L,   Duoneb, trelegy    OSA - noncompliant with BipAP  Hx of remote DVT not on anticoagulation  While in ER:   Desats down to 82% on 4L with ambulation     CXR - 1. No acute cardiopulmonary process. 2. Stable right upper lobe nodular density.    CTA chest - no PE COPD, chronic pulmonary nodules  Following Medications were ordered in ER: Medications  sodium chloride (PF) 0.9 % injection (has no administration in time range)  predniSONE (DELTASONE) tablet 60 mg (has no administration in time range)  albuterol (PROVENTIL) (2.5 MG/3ML) 0.083% nebulizer solution 5 mg (has no administration in time range)  ipratropium (ATROVENT) nebulizer solution 0.5 mg (has no administration in time range)  iohexol (OMNIPAQUE) 350 MG/ML injection 75 mL (75 mLs Intravenous Contrast Given 04/27/22 2030)       ED Triage Vitals  Enc Vitals Group     BP 04/27/22 1630 121/67     Pulse Rate 04/27/22 1630 89     Resp 04/27/22 1630 12     Temp 04/27/22 1630 98.7 F (37.1 C)     Temp src --      SpO2 04/27/22 1630 99 %     Weight 04/27/22 1759 115 lb 12.8 oz (52.5 kg)     Height 04/27/22 1759 5' (1.524 m)     Head Circumference --      Peak Flow --      Pain Score 04/27/22 2015 0     Pain Loc --      Pain Edu? --      Excl. in [place]? --   TMAX(24)@     _________________________________________ Significant initial  Findings: Abnormal Labs Reviewed  CBC WITH DIFFERENTIAL/PLATELET - Abnormal; Notable for the following components:      Result Value   MCV 103.2 (*)    MCHC 29.7 (*)    Platelets 149 (*)    All other components within normal limits  COMPREHENSIVE METABOLIC PANEL - Abnormal; Notable for the following components:   Chloride 91 (*)    CO2 42 (*)    Glucose, Bld 103 (*)    All other components within normal limits  BLOOD GAS, VENOUS -  Abnormal; Notable for the following components:   pCO2, Ven 105 (*)    pO2, Ven 49 (*)    Bicarbonate 49.3 (*)    Acid-Base Excess 17.2 (*)    All other components within normal limits     _________________________ Troponin  8-8 ECG: Ordered Personally reviewed and interpreted by me showing: HR : 87 Rhythm:Sinus rhythm Multiple premature complexes, vent & supraven Right atrial enlargement Anterior Q waves, possibly due to LVH No significant change since last tracing QTC 447   The recent clinical data is shown below. Vitals:   04/27/22 1900 04/27/22 1930 04/27/22 2100 04/27/22 2230  BP: 111/67 123/60 122/62 106/67  Pulse: 84 93 100 (!) 113  Resp: '20 16 19 18  '$ Temp:      SpO2: 100% 92% 90% 98%  Weight:      Height:         WBC     Component Value Date/Time   WBC 6.4 04/27/2022 1642   LYMPHSABS 1.5 04/27/2022 1642   LYMPHSABS 1.7 08/06/2016 1404   MONOABS 0.7 04/27/2022 1642   MONOABS 0.5 08/06/2016 1404   EOSABS 0.3 04/27/2022 1642   EOSABS 0.4 08/06/2016 1404   BASOSABS 0.0 04/27/2022 1642   BASOSABS 0.0 08/06/2016 1404       UA *** no evidence of UTI  ***Pending ***not ordered   Urine analysis:    Component Value Date/Time   COLORURINE YELLOW 02/25/2022 [place] 02/25/2022 1549   LABSPEC 1.010 02/25/2022 1549   PHURINE 7.0 02/25/2022 1549   GLUCOSEU NEGATIVE 02/25/2022 1549   HGBUR NEGATIVE 02/25/2022 1549   BILIRUBINUR NEGATIVE 02/25/2022 1549   BILIRUBINUR negative 08/11/2011 1358   KETONESUR NEGATIVE 02/25/2022 1549   PROTEINUR 30 (A) 01/29/2020 0532   UROBILINOGEN 0.2 02/25/2022 1549   NITRITE NEGATIVE 02/25/2022 1549   LEUKOCYTESUR NEGATIVE 02/25/2022 1549    Results for orders placed or performed during the hospital encounter of 07/30/21  Resp Panel by RT-PCR (Flu A&B, Covid) Nasopharyngeal Swab     Status: None   Collection Time: 07/30/21  3:21 PM   Specimen: Nasopharyngeal Swab; Nasopharyngeal(NP) swabs in vial  transport  medium  Result Value Ref Range Status   SARS Coronavirus 2 by RT PCR NEGATIVE NEGATIVE Final    Comment: (NOTE) SARS-CoV-2 target nucleic acids are NOT DETECTED.  The SARS-CoV-2 RNA is generally detectable in upper respiratory specimens during the acute phase of infection. The lowest concentration of SARS-CoV-2 viral copies this assay can detect is 138 copies/mL. A negative result does not preclude SARS-Cov-2 infection and should not be used as the sole basis for treatment or other patient management decisions. A negative result may occur with  improper specimen collection/handling, submission of specimen other than nasopharyngeal swab, presence of viral mutation(s) within the areas targeted by this assay, and inadequate number of viral copies(<138 copies/mL). A negative result must be combined with clinical observations, patient history, and epidemiological information. The expected result is Negative.  Fact Sheet for Patients:  EntrepreneurPulse.com.au  Fact Sheet for Healthcare Providers:  IncredibleEmployment.be  This test is no t yet approved or cleared by the [place] FDA and  has been authorized for detection and/or diagnosis of SARS-CoV-2 by FDA under an Emergency Use Authorization (EUA). This EUA will remain  in effect (meaning this test can be used) for the duration of the COVID-19 declaration under Section 564(b)(1) of the Act, 21 U.S.C.section 360bbb-3(b)(1), unless the authorization is terminated  or revoked sooner.       Influenza A by PCR NEGATIVE NEGATIVE Final   Influenza B by PCR NEGATIVE NEGATIVE Final    Comment: (NOTE) The Xpert Xpress SARS-CoV-2/FLU/RSV plus assay is intended as an aid in the diagnosis of influenza from Nasopharyngeal swab specimens and should not be used as a sole basis for treatment. Nasal washings and aspirates are unacceptable for Xpert Xpress SARS-CoV-2/FLU/RSV testing.  Fact Sheet for  Patients: EntrepreneurPulse.com.au  Fact Sheet for Healthcare Providers: IncredibleEmployment.be  This test is not yet approved or cleared by the [place] FDA and has been authorized for detection and/or diagnosis of SARS-CoV-2 by FDA under an Emergency Use Authorization (EUA). This EUA will remain in effect (meaning this test can be used) for the duration of the COVID-19 declaration under Section 564(b)(1) of the Act, 21 U.S.C. section 360bbb-3(b)(1), unless the authorization is terminated or revoked.  Performed at [facility], [place] [address] [place], [place] [address]    *Note: Due to a large number of results and/or encounters for the requested time period, some results have not been displayed. A complete set of results can be found in Results Review.     _______________________________________________ Hospitalist was called for admission for   COPD exacerbation [facility])     The following Work up has been ordered so far:  Orders Placed This Encounter  Procedures  . DG Chest Port 1 View  . CT Angio Chest PE W and/or Wo Contrast  . Brain natriuretic peptide  . CBC with Differential/Platelet  . Comprehensive metabolic panel  . Blood gas, venous (at [facility] and [facility], not at [facility])  . Weigh patient  . Check Pulse Oximetry while ambulating  . Consult to hospitalist  . I-Stat venous blood gas, ED  . ED EKG     OTHER Significant initial  Findings:   Recent Labs  Lab 04/27/22 1642  NA 142  K 4.0  CO2 42*  GLUCOSE 103*  BUN 14  CREATININE 0.68  CALCIUM 9.7    Cr  stable,   Lab Results  Component Value Date   CREATININE 0.68 04/27/2022   CREATININE 0.79 02/25/2022   CREATININE  0.86 08/01/2021    Recent Labs  Lab 04/27/22 1642  AST 34  ALT 22  ALKPHOS 57  BILITOT 1.0  PROT 7.9  ALBUMIN 4.2   Lab Results  Component Value Date   CALCIUM 9.7 04/27/2022   PHOS 3.0 05/26/2018          Plt: Lab Results   Component Value Date   PLT 149 (L) 04/27/2022      Venous  Blood Gas result:  pH  Latest Reference Range & Units Most Recent  pH, Ven 7.25 - 7.43  7.28 04/27/22 17:46  pCO2, Ven 44 - 60 mmHg 105 (HH) 04/27/22 17:46  pO2, Ven 32 - 45 mmHg 49 (H) 04/27/22 17:46  ([place]): Data is critically high (H): Data is abnormally high  ABG    Component Value Date/Time   PHART 7.276 (L) 03/01/2020 1532   PCO2ART 110 (HH) 03/01/2020 1532   PO2ART 73.5 (L) 03/01/2020 1532   HCO3 49.3 (H) 04/27/2022 1746   TCO2 48 (H) 03/16/2020 0001   O2SAT 79.1 04/27/2022 1746         Recent Labs  Lab 04/27/22 1642  WBC 6.4  NEUTROABS 3.9  HGB 13.3  HCT 44.8  MCV 103.2*  PLT 149*    HG/HCT   stable,      Component Value Date/Time   HGB 13.3 04/27/2022 1642   HGB 14.1 08/06/2016 1404   HCT 44.8 04/27/2022 1642   HCT 33.9 (L) 03/28/2018 0336   HCT 44.6 08/06/2016 1404   MCV 103.2 (H) 04/27/2022 1642   MCV 92.3 08/06/2016 1404    Cardiac Panel (last 3 results) No results for input(s): "CKTOTAL", "CKMB", "TROPONINI", "RELINDX" in the last 72 hours.  .car BNP (last 3 results) Recent Labs    07/30/21 1521 04/27/22 1642  BNP 28.0 16.6      DM  labs:  HbA1C: Recent Labs    02/25/22 1532  HGBA1C 6.5*       CBG (last 3)  No results for input(s): "GLUCAP" in the last 72 hours.        Cultures:    Component Value Date/Time   SDES BLOOD RIGHT ANTECUBITAL 03/01/2020 1217   SDES BLOOD LEFT HAND 03/01/2020 1217   SPECREQUEST  03/01/2020 1217    BOTTLES DRAWN AEROBIC AND ANAEROBIC Blood Culture results may not be optimal due to an inadequate volume of blood received in culture bottles   SPECREQUEST  03/01/2020 1217    AEROBIC BOTTLE ONLY Blood Culture results may not be optimal due to an inadequate volume of blood received in culture bottles   CULT  03/01/2020 1217    NO GROWTH 5 DAYS Performed at [facility], [place] [address] [place], [place] [address]    CULT  03/01/2020 1217     NO GROWTH 5 DAYS Performed at [facility], [place] [address], [place], [place] [address]    REPTSTATUS 03/06/2020 FINAL 03/01/2020 1217   REPTSTATUS 03/06/2020 FINAL 03/01/2020 1217     Radiological Exams on Admission: CT Angio Chest PE W and/or Wo Contrast  Result Date: 04/27/2022 CLINICAL DATA:  Pulmonary embolism suspected, high probability. Chronic shortness of breath, chest pain, and COPD. EXAM: CT ANGIOGRAPHY CHEST WITH CONTRAST TECHNIQUE: Multidetector CT imaging of the chest was performed using the standard protocol during bolus administration of intravenous contrast. Multiplanar CT image reconstructions and MIPs were obtained to evaluate the vascular anatomy. RADIATION DOSE REDUCTION: This exam was performed according to the departmental dose-optimization  program which includes automated exposure control, adjustment of the mA and/or kV according to patient size and/or use of iterative reconstruction technique. CONTRAST:  18m OMNIPAQUE IOHEXOL 350 MG/ML SOLN COMPARISON:  07/30/2021. FINDINGS: Cardiovascular: The heart is normal in size and there is a trace pericardial effusion. Scattered coronary artery calcifications are noted. There is atherosclerotic calcification of the aorta without evidence of aneurysm. The pulmonary trunk is normal in caliber. No pulmonary artery filling defect is identified. Mediastinum/Nodes: No enlarged mediastinal, hilar, or axillary lymph nodes. Thyroid gland, trachea, and esophagus demonstrate no significant findings. Lungs/Pleura: Emphysematous changes are present in the lungs. Mild atelectasis or infiltrate is noted at the lung bases. No effusion or pneumothorax. There is a stable nodular opacity in the left upper lobe measuring 1 cm, axial image 43. There is a stable 2 mm nodule in the left upper lobe, axial image 46. Stable scarring or atelectasis is noted in the posterior segment of the right upper lobe, axial image 58. There is a stable nodular opacity in  the right upper lobe measuring 6 mm, axial image 55. Upper Abdomen: No acute abnormality. Musculoskeletal: Degenerative changes are present in the thoracolumbar spine. No acute osseous abnormality. Review of the MIP images confirms the above findings. IMPRESSION: 1. No evidence of pulmonary embolism. 2. Mild atelectasis or infiltrate at the lung bases. Stable atelectasis or scarring in the posterior segment of the right upper lobe. 3. Stable bilateral pulmonary nodules, the largest measuring 1 cm. Consider one of the following in 3 months for both low-risk and high-risk individuals: (a) repeat chest CT, (b) follow-up PET-CT, or (c) tissue sampling. This recommendation follows the consensus statement: Guidelines for Management of Incidental Pulmonary Nodules Detected on CT Images: From the Fleischner Society 2017; Radiology 2017; 284:228-243. 4. Emphysema. 5. Aortic atherosclerosis with coronary artery calcifications. Electronically Signed   By: LBrett FairyM.D.   On: 04/27/2022 21:13   DG Chest Port 1 View  Result Date: 04/27/2022 CLINICAL DATA:  Shortness of breath. EXAM: PORTABLE CHEST 1 VIEW COMPARISON:  Chest x-ray 07/30/2021.  CT chest 07/30/2021. FINDINGS: Right upper lobe nodular density appears unchanged. There is no new focal lung infiltrate, pleural effusion or pneumothorax. Cardiomediastinal silhouette is within normal limits. No acute fractures. IMPRESSION: 1. No acute cardiopulmonary process. 2. Stable right upper lobe nodular density. Electronically Signed   By: ARonney AstersM.D.   On: 04/27/2022 16:58   _______________________________________________________________________________________________________ Latest  Blood pressure 106/67, pulse (!) 113, temperature 98.7 F (37.1 C), resp. rate 18, height 5' (1.524 m), weight 52.5 kg, SpO2 98 %.   Vitals  labs and radiology finding personally reviewed  Review of Systems:    Pertinent positives include:  shortness of breath at rest.  dyspnea  on exertion  Constitutional:  No weight loss, night sweats, Fevers, chills, fatigue, weight loss  HEENT:  No headaches, Difficulty swallowing,Tooth/dental problems,Sore throat,  No sneezing, itching, ear ache, nasal congestion, post nasal drip,  Cardio-vascular:  No chest pain, Orthopnea, PND, anasarca, dizziness, palpitations.no Bilateral lower extremity swelling  GI:  No heartburn, indigestion, abdominal pain, nausea, vomiting, diarrhea, change in bowel habits, loss of appetite, melena, blood in stool, hematemesis Resp:  no , No excess mucus, no productive cough, No non-productive cough, No coughing up of blood.No change in color of mucus.No wheezing. Skin:  no rash or lesions. No jaundice GU:  no dysuria, change in color of urine, no urgency or frequency. No straining to urinate.  No flank pain.  Musculoskeletal:  No  joint pain or no joint swelling. No decreased range of motion. No back pain.  Psych:  No change in mood or affect. No depression or anxiety. No memory loss.  Neuro: no localizing neurological complaints, no tingling, no weakness, no double vision, no gait abnormality, no slurred speech, no confusion  All systems reviewed and apart from [place] all are negative _______________________________________________________________________________________________ Past Medical History:   Past Medical History:  Diagnosis Date  . ALLERGIC RHINITIS 05/01/2007  . Anxiety   . Arthritis    "hands" (12/28/2015)  . ASTHMA 05/01/2007   "since I was a child"  . Cancer (HCC)    LUNG  . CHF (congestive heart failure) ([place])   . Chronic bronchitis ([place])   . COPD (chronic obstructive pulmonary disease) ([place])   . DEPRESSION 07/21/2008  . DVT (deep venous thrombosis) (HCC) 1960s   LLE  . FATIGUE 10/10/2010  . HYPERTENSION 05/01/2007  . HYPOTHYROIDISM 08/23/2009   s/p RAI  . LUMBAR RADICULOPATHY, RIGHT 08/25/2008  . On home oxygen therapy    "3-4L; qd; all the time" (12/28/2015)  .  OSTEOPOROSIS 05/01/2007  . SHOULDER PAIN, LEFT 08/23/2009  . SINUSITIS, CHRONIC 10/10/2010      Past Surgical History:  Procedure Laterality Date  . APPENDECTOMY    . DILATION AND CURETTAGE OF UTERUS  multiple   history of multiple dialations and curettages and miscarriages, unfortunately never carrying a child to term  . ECTOPIC PREGNANCY SURGERY  "late '60s or early '70s  . ELECTROCARDIOGRAM  06/20/2006  . OOPHORECTOMY Right     Social History:  Ambulatory *** independently cane, walker  wheelchair bound, bed bound     reports that she quit smoking about 5 years ago. Her smoking use included cigarettes. She started smoking about 58 years ago. She has a 52.00 pack-year smoking history. She has never used smokeless tobacco. She reports that she does not currently use alcohol. She reports that she does not currently use drugs.     Family History:   Family History  Problem Relation Age of Onset  . Lung cancer Father   . Alcohol abuse Brother   . Diabetes Brother   . Hypertension Other   . Stroke Mother   . Thyroid disease Mother   . Asthma Other        maternal aunts  . Breast cancer Neg Hx    ______________________________________________________________________________________________ Allergies: Allergies  Allergen Reactions  . Alendronate Sodium Other (See Comments)    Patient does not remember this reaction     Prior to Admission medications   Medication Sig Start Date End Date Taking? Authorizing Provider  albuterol (VENTOLIN HFA) 108 (90 Base) MCG/ACT inhaler INHALE 1 TO 2 PUFFS INTO THE LUNGS EVERY 6 HOURS AS NEEDED FOR WHEEZING OR SHORTNESS OF BREATH 04/14/22   Biagio Borg, MD  aspirin EC 81 MG EC tablet Take 1 tablet (81 mg total) by mouth daily. 03/27/16   Velvet Bathe, MD  atorvastatin (LIPITOR) 10 MG tablet TAKE 1 TABLET BY MOUTH DAILY. Patient taking differently: Take 10 mg by mouth daily. 07/12/21   Biagio Borg, MD  citalopram (CELEXA) 20 MG tablet TAKE  ONE TABLET BY MOUTH DAILY Patient taking differently: Take 20 mg by mouth daily. TAKE ONE TABLET BY MOUTH DAILY 04/19/21   Biagio Borg, MD  Fluticasone-Umeclidin-Vilant (TRELEGY ELLIPTA) 200-62.5-25 MCG/INH AEPB Inhale 1 puff into the lungs daily. 04/19/21   Biagio Borg, MD  furosemide (LASIX) 40 MG tablet 1 tab by mouth  twice per day Patient taking differently: Take 40 mg by mouth 2 (two) times daily. 04/19/21   Biagio Borg, MD  guaiFENesin (ROBITUSSIN) 100 MG/5ML liquid Take 5 mLs by mouth every 4 (four) hours as needed for cough or to loosen phlegm. Mucinex chest congestion (liquid) Patient not taking: Reported on 03/05/2022    [provider]  ipratropium-albuterol (DUONEB) 0.5-2.5 (3) MG/3ML SOLN Take 3 mLs by nebulization every 6 (six) hours as needed. 11/05/21   Rigoberto Noel, MD  levothyroxine (SYNTHROID) 75 MCG tablet Take 1 tablet (75 mcg total) by mouth daily before breakfast. TAKE ONE TABLET BY MOUTH EARLY MORNING 02/26/22   Shamleffer, Melanie Crazier, MD  OXYGEN Inhale 3 L into the lungs continuous. 24/7    [provider]  potassium chloride (KLOR-CON) 10 MEQ tablet TAKE 1 TABLET BY MOUTH 2 TIMES DAILY. Patient taking differently: Take 10 mEq by mouth 2 (two) times daily. 04/19/21   Biagio Borg, MD    ___________________________________________________________________________________________________ Physical Exam:    04/27/2022   10:30 PM 04/27/2022    9:00 PM 04/27/2022    7:30 PM  Vitals with BMI  Systolic 696 295 284  Diastolic 67 62 60  Pulse 132 100 93     1. General:  in No  Acute distress    Chronically ill   -appearing 2. Psychological: Alert and   Oriented 3. Head/ENT:  Dry Mucous Membranes                          Head Non traumatic, neck supple                           Poor Dentition 4. SKIN:  decreased Skin turgor,  Skin clean Dry and intact no rash 5. Heart: Regular rate and rhythm no*** Murmur, no Rub or gallop 6. Lungs: ***Clear to  auscultation bilaterally, no wheezes or crackles   7. Abdomen: Soft, ***non-tender, Non distended *** obese ***bowel sounds present 8. Lower extremities: no clubbing, cyanosis,  bilateral edema 9. Neurologically Grossly intact, moving all 4 extremities equally   10. MSK: Normal range of motion    Chart has been reviewed  ______________________________________________________________________________________________  Assessment/Plan  ***  Admitted for *** COPD exacerbation ([place]) ***    Present on Admission: **None**     No problem-specific Assessment & Plan notes found for this encounter.    Other plan as per orders.  DVT prophylaxis:  SCD *** Lovenox       Code Status:    Code Status: Prior FULL CODE *** DNR/DNI ***comfort care as per patient ***family  I had personally discussed CODE STATUS with patient and family* I had spent *min discussing goals of care and CODE STATUS    Family Communication:   Family not at  Bedside  plan of care was discussed on the phone with *** Son, Daughter, Wife, Husband, Sister, Brother , father, mother  Disposition Plan:   *** likely will need placement for rehabilitation                          Back to current facility when stable                            To home once workup is complete and patient is stable  ***Following barriers for discharge:  Electrolytes corrected                               Anemia corrected                             Pain controlled with PO medications                               Afebrile, white count improving able to transition to PO antibiotics                             Will need to be able to tolerate PO                            Will likely need home health, home O2, set up                           Will need consultants to evaluate patient prior to discharge  ****EXPECT DC tomorrow                    ***Would benefit from PT/OT eval prior to DC  Ordered                    Swallow eval - SLP ordered                   Diabetes care coordinator                   Transition of care consulted                   Nutrition    consulted                  Wound care  consulted                   Palliative care    consulted                   Behavioral health  consulted                    Consults called: ***    Admission status:  ED Disposition     ED Disposition  Admit   Condition  --   Comment  The patient appears reasonably stabilized for admission considering the current resources, flow, and capabilities available in the ED at this time, and I doubt any other [facility] requiring further screening and/or treatment in the ED prior to admission is  present.           Obs***  ***  inpatient     I Expect 2 midnight stay secondary to severity of patient's current illness need for inpatient interventions justified by the following: ***hemodynamic instability despite optimal treatment (tachycardia *hypotension * tachypnea *hypoxia, hypercapnia) * Severe lab/radiological/exam abnormalities including:     and extensive comorbidities including: *substance abuse  *Chronic pain *DM2  * CHF * CAD  * COPD/asthma *Morbid Obesity * CKD *dementia *liver disease *history of stroke with residual deficits *  malignancy, * sickle cell disease  History of amputation Chronic anticoagulation  That are currently affecting  medical management.   I expect  patient to be hospitalized for 2 midnights requiring inpatient medical care.  Patient is at high risk for adverse outcome (such as loss of life or disability) if not treated.  Indication for inpatient stay as follows:  Severe change from baseline regarding mental status Hemodynamic instability despite maximal medical therapy,  ongoing suicidal ideations,  severe pain requiring acute inpatient management,  inability to maintain oral hydration   persistent chest pain despite medical management Need for  operative/procedural  intervention New or worsening hypoxia   Need for IV antibiotics, IV fluids, IV rate controling medications, IV antihypertensives, IV pain medications, IV anticoagulation, need for biPAP    Level of care   *** tele  For 12H 24H     medical floor       progressive tele indefinitely please discontinue once patient no longer qualifies COVID-19 Labs    Lab Results  Component Value Date   [place] NEGATIVE 07/30/2021     Precautions: admitted as *** Covid Negative  ***asymptomatic screening protocol****PUI *** covid positive No active isolations ***If Covid PCR is negative  - please DC precautions - would need additional investigation given very high risk for false native test result    Critical***  Patient is critically ill due to  hemodynamic instability * respiratory failure *severe sepsis* ongoing chest pain*  They are at high risk for life/limb threatening clinical deterioration requiring frequent reassessment and modifications of care.  Services provided include examination of the patient, review of relevant ancillary tests, prescription of lifesaving therapies, review of medications and prophylactic therapy.  Total critical care time excluding separately billable procedures: 60*  Minutes.    Aubri Gathright 04/27/2022, 11:24 PM ***  [facility]     after 2 AM please page floor coverage PA If 7AM-7PM, please contact the day team taking care of the patient using [email or web address]   Patient was evaluated in the context of the global COVID-19 pandemic, which necessitated consideration that the patient might be at risk for infection with the SARS-CoV-2 virus that causes COVID-19. Institutional protocols and algorithms that pertain to the evaluation of patients at risk for COVID-19 are in a state of rapid change based on information released by regulatory bodies including the CDC and federal and state organizations. These policies and algorithms were  followed during the patient's care.

## 2022-04-27 NOTE — ED Notes (Signed)
Pt resting no complaints at this time, A&Ox4

## 2022-04-27 NOTE — Assessment & Plan Note (Signed)
Suspect the cause for peripheral edema  will repeat echo

## 2022-04-27 NOTE — Assessment & Plan Note (Signed)
this patient has acute respiratory failure with Hypoxia and  Hypercarbia as documented by the presence of following: O2 saturatio< 90% on RA  pH <7.35 with pCO2 >50 Likely due to: COPD exacerbation  Provide O2 therapy and titrate as needed  Continuous pulse ox   check Pulse ox with ambulation prior to discharge   may need  TC consult for home O2 set up  flutter valve ordered

## 2022-04-27 NOTE — ED Provider Notes (Signed)
[facility] DEPT Provider Note   CSN: [id number] Arrival date & time: 04/27/22  1616     History  Chief Complaint  Patient presents with   Shortness of Breath    Jasmine [name] is a 76 y.o. female.  Patient is a 76 year old female with a history of COPD on 4 L of oxygen chronically, hypothyroidism, CHF, hypertension, and prior DVT but not on any anticoagulation at this time who is presenting today with EMS for shortness of breath.  Patient reports that really its been going on for the last 1 month and has been gradually worsening.  She notices it most with exertion.  She reports at this time she is unable to walk from one room of the house to the other without getting winded.  She also noticed she will get some tightness in her chest even when she tries to lift a gallon of milk.  She reports after she sets the milk down and goes down and sits and rests it usually resolves within a few minutes.  She gets this frequently but has not had any pain today.  She has had ongoing swelling in her lower extremities and is not sure how long that has been going on but reports that she has been sleeping on the couch and her legs seem to be a little more swollen than her baseline.  She does check her weight regularly and it has been stable at 116 pounds.  She has not had much cough and denies any sputum production or fever.  She reports because she is so short of breath she just does not eat a lot and typically eats out a lot because she is too short of breath to cook herself anything.  Today she had worsening of her shortness of breath and was out of her rescue inhaler and at that time she called 911.  EMS did report that patient sats were 84% on her 4 L when they arrived.  Oxygen improved to 100% after a DuoNeb as well as 0.5 of Atrovent and 10 mg of albuterol.  Patient does report currently she is feeling better.  She denies any chest pain.  The history is provided by the patient  and medical records.  Shortness of Breath      Home Medications Prior to Admission medications   Medication Sig Start Date End Date Taking? Authorizing Provider  albuterol (VENTOLIN HFA) 108 (90 Base) MCG/ACT inhaler INHALE 1 TO 2 PUFFS INTO THE LUNGS EVERY 6 HOURS AS NEEDED FOR WHEEZING OR SHORTNESS OF BREATH 04/14/22   Biagio Borg, MD  aspirin EC 81 MG EC tablet Take 1 tablet (81 mg total) by mouth daily. 03/27/16   Velvet Bathe, MD  atorvastatin (LIPITOR) 10 MG tablet TAKE 1 TABLET BY MOUTH DAILY. Patient taking differently: Take 10 mg by mouth daily. 07/12/21   Biagio Borg, MD  citalopram (CELEXA) 20 MG tablet TAKE ONE TABLET BY MOUTH DAILY Patient taking differently: Take 20 mg by mouth daily. TAKE ONE TABLET BY MOUTH DAILY 04/19/21   Biagio Borg, MD  Fluticasone-Umeclidin-Vilant (TRELEGY ELLIPTA) 200-62.5-25 MCG/INH AEPB Inhale 1 puff into the lungs daily. 04/19/21   Biagio Borg, MD  furosemide (LASIX) 40 MG tablet 1 tab by mouth twice per day Patient taking differently: Take 40 mg by mouth 2 (two) times daily. 04/19/21   Biagio Borg, MD  guaiFENesin (ROBITUSSIN) 100 MG/5ML liquid Take 5 mLs by mouth every 4 (four) hours as  needed for cough or to loosen phlegm. Mucinex chest congestion (liquid) Patient not taking: Reported on 03/05/2022    [provider]  ipratropium-albuterol (DUONEB) 0.5-2.5 (3) MG/3ML SOLN Take 3 mLs by nebulization every 6 (six) hours as needed. 11/05/21   Rigoberto Noel, MD  levothyroxine (SYNTHROID) 75 MCG tablet Take 1 tablet (75 mcg total) by mouth daily before breakfast. TAKE ONE TABLET BY MOUTH EARLY MORNING 02/26/22   Shamleffer, Melanie Crazier, MD  OXYGEN Inhale 3 L into the lungs continuous. 24/7    [provider]  potassium chloride (KLOR-CON) 10 MEQ tablet TAKE 1 TABLET BY MOUTH 2 TIMES DAILY. Patient taking differently: Take 10 mEq by mouth 2 (two) times daily. 04/19/21   Biagio Borg, MD      Allergies    Alendronate sodium     Review of Systems   Review of Systems  Respiratory:  Positive for shortness of breath.     Physical Exam Updated Vital Signs BP 106/67   Pulse (!) 113   Temp 98.7 F (37.1 C)   Resp 18   Ht 5' (1.524 m)   Wt 52.5 kg   SpO2 98%   BMI 22.62 kg/m  Physical Exam Vitals and nursing note reviewed.  Constitutional:      General: She is not in acute distress.    Appearance: She is well-developed.  HENT:     Head: Normocephalic and atraumatic.  Eyes:     Pupils: Pupils are equal, round, and reactive to light.  Cardiovascular:     Rate and Rhythm: Normal rate and regular rhythm.     Heart sounds: Normal heart sounds. No murmur heard.    No friction rub.  Pulmonary:     Effort: Pulmonary effort is normal. Tachypnea present.     Breath sounds: Decreased breath sounds and wheezing present. No rales.     Comments: Scant expiratory wheeze but generally significantly decreased breath sounds throughout Abdominal:     General: Bowel sounds are normal. There is no distension.     Palpations: Abdomen is soft.     Tenderness: There is no abdominal tenderness. There is no guarding or rebound.  Musculoskeletal:        General: No tenderness. Normal range of motion.     Right lower leg: Edema present.     Left lower leg: Edema present.     Comments: 2+ pitting edema in bilateral lower extremities  Skin:    General: Skin is warm and dry.     Findings: No rash.  Neurological:     Mental Status: She is alert and oriented to person, place, and time. Mental status is at baseline.     Cranial Nerves: No cranial nerve deficit.  Psychiatric:        Mood and Affect: Mood normal.        Behavior: Behavior normal.     ED Results / Procedures / Treatments   Labs (all labs ordered are listed, but only abnormal results are displayed) Labs Reviewed  CBC WITH DIFFERENTIAL/PLATELET - Abnormal; Notable for the following components:      Result Value   MCV 103.2 (*)    MCHC 29.7 (*)     Platelets 149 (*)    All other components within normal limits  COMPREHENSIVE METABOLIC PANEL - Abnormal; Notable for the following components:   Chloride 91 (*)    CO2 42 (*)    Glucose, Bld 103 (*)    All other components  within normal limits  BLOOD GAS, VENOUS - Abnormal; Notable for the following components:   pCO2, Ven 105 (*)    pO2, Ven 49 (*)    Bicarbonate 49.3 (*)    Acid-Base Excess 17.2 (*)    All other components within normal limits  BRAIN NATRIURETIC PEPTIDE  I-STAT VENOUS BLOOD GAS, ED  TROPONIN I (HIGH SENSITIVITY)  TROPONIN I (HIGH SENSITIVITY)    EKG EKG Interpretation  Date/Time:  Sunday April 27 2022 16:50:21 EDT Ventricular Rate:  87 PR Interval:  169 QRS Duration: 93 QT Interval:  370 QTC Calculation: 446 R Axis:   89 Text Interpretation: Sinus rhythm Multiple premature complexes, vent & supraven Right atrial enlargement Anterior Q waves, possibly due to LVH No significant change since last tracing Confirmed by Blanchie Dessert [phone]) on 04/27/2022 5:13:32 PM  Radiology CT Angio Chest PE W and/or Wo Contrast  Result Date: 04/27/2022 CLINICAL DATA:  Pulmonary embolism suspected, high probability. Chronic shortness of breath, chest pain, and COPD. EXAM: CT ANGIOGRAPHY CHEST WITH CONTRAST TECHNIQUE: Multidetector CT imaging of the chest was performed using the standard protocol during bolus administration of intravenous contrast. Multiplanar CT image reconstructions and MIPs were obtained to evaluate the vascular anatomy. RADIATION DOSE REDUCTION: This exam was performed according to the departmental dose-optimization program which includes automated exposure control, adjustment of the mA and/or kV according to patient size and/or use of iterative reconstruction technique. CONTRAST:  78m OMNIPAQUE IOHEXOL 350 MG/ML SOLN COMPARISON:  07/30/2021. FINDINGS: Cardiovascular: The heart is normal in size and there is a trace pericardial effusion. Scattered coronary  artery calcifications are noted. There is atherosclerotic calcification of the aorta without evidence of aneurysm. The pulmonary trunk is normal in caliber. No pulmonary artery filling defect is identified. Mediastinum/Nodes: No enlarged mediastinal, hilar, or axillary lymph nodes. Thyroid gland, trachea, and esophagus demonstrate no significant findings. Lungs/Pleura: Emphysematous changes are present in the lungs. Mild atelectasis or infiltrate is noted at the lung bases. No effusion or pneumothorax. There is a stable nodular opacity in the left upper lobe measuring 1 cm, axial image 43. There is a stable 2 mm nodule in the left upper lobe, axial image 46. Stable scarring or atelectasis is noted in the posterior segment of the right upper lobe, axial image 58. There is a stable nodular opacity in the right upper lobe measuring 6 mm, axial image 55. Upper Abdomen: No acute abnormality. Musculoskeletal: Degenerative changes are present in the thoracolumbar spine. No acute osseous abnormality. Review of the MIP images confirms the above findings. IMPRESSION: 1. No evidence of pulmonary embolism. 2. Mild atelectasis or infiltrate at the lung bases. Stable atelectasis or scarring in the posterior segment of the right upper lobe. 3. Stable bilateral pulmonary nodules, the largest measuring 1 cm. Consider one of the following in 3 months for both low-risk and high-risk individuals: (a) repeat chest CT, (b) follow-up PET-CT, or (c) tissue sampling. This recommendation follows the consensus statement: Guidelines for Management of Incidental Pulmonary Nodules Detected on CT Images: From the Fleischner Society 2017; Radiology 2017; 284:228-243. 4. Emphysema. 5. Aortic atherosclerosis with coronary artery calcifications. Electronically Signed   By: LBrett FairyM.D.   On: 04/27/2022 21:13   DG Chest Port 1 View  Result Date: 04/27/2022 CLINICAL DATA:  Shortness of breath. EXAM: PORTABLE CHEST 1 VIEW COMPARISON:  Chest  x-ray 07/30/2021.  CT chest 07/30/2021. FINDINGS: Right upper lobe nodular density appears unchanged. There is no new focal lung infiltrate, pleural effusion or pneumothorax. Cardiomediastinal  silhouette is within normal limits. No acute fractures. IMPRESSION: 1. No acute cardiopulmonary process. 2. Stable right upper lobe nodular density. Electronically Signed   By: Ronney Asters M.D.   On: 04/27/2022 16:58    Procedures Procedures    Medications Ordered in ED Medications  sodium chloride (PF) 0.9 % injection (has no administration in time range)  predniSONE (DELTASONE) tablet 60 mg (has no administration in time range)  albuterol (PROVENTIL) (2.5 MG/3ML) 0.083% nebulizer solution 5 mg (has no administration in time range)  ipratropium (ATROVENT) nebulizer solution 0.5 mg (has no administration in time range)  iohexol (OMNIPAQUE) 350 MG/ML injection 75 mL (75 mLs Intravenous Contrast Given 04/27/22 2030)    ED Course/ Medical Decision Making/ A&P                           Medical Decision Making Amount and/or Complexity of Data Reviewed External Data Reviewed: notes.    Details: pcp note Labs: ordered. Decision-making details documented in ED Course. Radiology: ordered and independent interpretation performed. Decision-making details documented in ED Course. ECG/medicine tests: ordered and independent interpretation performed. Decision-making details documented in ED Course.  Risk Prescription drug management. Decision regarding hospitalization.   Pt with multiple medical problems and comorbidities and presenting today with a complaint that caries a high risk for morbidity and mortality.  Presenting today due to hypoxia and respiratory distress.  Patient has multiple medical problems and shortness of breath could be from multiple factors.  Patient could be having a COPD exacerbation due to running out of her rescue inhaler however it sounds like things have worsened over the last month  and she does have evidence of fluid overload despite taking her Lasix daily.  Her weights have not significantly changed but she does report eating out a lot because she is too short of breath to make herself food which has significant amount of sodium.  We will weigh patient today.  At her PCP office 2 months ago her weight was 116 pounds.  She reports when she weighed yesterday that was what her weight was.  She did receive albuterol, Atrovent with EMS and does report feeling better.  She does have globally decreased breath sounds on her exam.  Lower suspicion for PE or pneumonia.  Patient is getting chest pain when she tries to lift milk or do other small tasks but reports it always goes away.  Concern for component of angina. Labs, imaging and EKG are pending.  11:15 PM I independently interpreted patient's EKG and labs today.  Troponin x2 was negative, VBG with a blood gas with a CO2 of 105 but normal pH of 0.24 and metabolic compensation with a CO2 of 49, BNP normal, CBC within normal limits and CMP otherwise normal.  EKG without acute findings today. I have independently visualized and interpreted pt's images today. Chest x-ray without acute findings today.  A CT of the chest was done to evaluate for PE given patient's worsening shortness of breath and labs not consistent with CHF.  Also patient had an echo done in October 2022 which showed preserved EF of 60 to 65% with LVH and no evidence of diastolic dysfunction or significant pulmonary hypertension.  Could be that patient's worsening shortness of breath is related to COPD exacerbation or worsening of her chronic disease.  Findings discussed with the patient.  At this time she is on her 4 L of home oxygen.  At rest she is satting  91 to 93% but even with talking oxygen saturation drops to 86 or 87 briefly.  Patient is not sure if she is safe to go home.  She was given prednisone here but will attempt to ambulate the patient to ensure she does not  become severely hypoxic.  She may need admission for treatment of COPD exacerbation.  Also her weight here is the same as doctors visit months ago.  11:15 PM Patient attempted to ambulate and sats dropped to 82% on her home 4 L and she became very short of breath.  Patient has received prednisone but will get another albuterol and Atrovent treatment.  Feel that she is going to need admission for COPD exacerbation.  May also require higher levels of oxygen at home.  CTA of the chest was negative for PE but did show some scarring and atelectasis.  Patient not displaying any symptoms consistent with pneumonia today.           Final Clinical Impression(s) / ED Diagnoses Final diagnoses:  COPD exacerbation [facility])    Rx / DC Orders ED Discharge Orders     None         Blanchie Dessert, MD 04/27/22 2315

## 2022-04-27 NOTE — ED Notes (Signed)
Pt c/o state she is not receiving enough 02. Entering rm pt 02 stat 93% 4L before leaving rm PT stat went up to 98% 4L. RN made aware

## 2022-04-27 NOTE — Assessment & Plan Note (Signed)
-  -   Will initiate: Steroid taper  -  Antibiotics rocephin, - Albuterol  PRN, - scheduled duoneb,  -  Breo or Dulera at discharge   -  Mucinex.  Titrate O2 to saturation >90%. Follow patients respiratory status.  VBG showing compensated chronic hypercarbia -   PCCM consultedto see in AM sent e-link msg  - BiPAP ordered given hypercarbia, pt supposed to be on BIpap at night but could not get it sent back    Currently mentating well

## 2022-04-27 NOTE — ED Triage Notes (Signed)
Pt BIB EMS from home, c/o SOB. Reportedly ran out of rescue inhaler. Tried to call pharmacy but they are closed. Called MD office, they informed her to report to the ED for evaluation. 5 mg duoneb, 0.5 mg atrovent, and 10 mg albuterol.   BP 118/64 P 86 spo2 84% 4 liters 100% after nebs

## 2022-04-27 NOTE — Subjective & Objective (Signed)
Known hx of COPD on chronic O2 4L Run out of he rescue inhaler Was unable to get it today started to get progressively more short of breath and called 911  She used to wear bipAP but it got recalled  Can hardly walk now without getting short of breath

## 2022-04-27 NOTE — H&P (Signed)
Jasmine [name] GXQ:[id number] DOB: [birth year] DOA: 04/27/2022     PCP: Biagio Borg, MD   Outpatient Specialists:     Pulmonary    [facility]  Endocrinology Shamleffer, Melanie Crazier, MD GI Dr. Rush Landmark, Telford Nab., MD [place], [place])      Patient arrived to ER on 04/27/22 at 1616 Referred by Attending Blanchie Dessert, MD   Patient coming from:    home Lives with ex-husband  Chief Complaint:   Chief Complaint  Patient presents with   Shortness of Breath    HPI: Jasmine [name] is a 76 y.o. female with medical history significant of end- stage COPD on 4L, DM2, osteoporosis, hypothyrodism Graves' Disease many years ago, she is S/P RAI ablation in 2006 , HLD,    Presented with   worsening shortness of breath Known hx of COPD on chronic O2 4L Run out of he rescue inhaler Was unable to get it today started to get progressively more short of breath and called 911  She used to wear bipAP but it got recalled  Can hardly walk now without getting short of breath  Pt reports leg edema for some time Has to sleep on a couch props up her legs  Leg edema been going on for years states. States no longer smokes  She drinks sometimes wine coolers but not regularly basis  Reports occasional chest pain with exertion but not currently No fevers or chills  Regarding pertinent Chronic problems:     Hyperlipidemia -  on statins Lipitor (atorvastatin)  Lipid Panel     Component Value Date/Time   CHOL 188 02/25/2022 1549   TRIG 44.0 02/25/2022 1549   HDL 102.60 02/25/2022 1549   CHOLHDL 2 02/25/2022 1549   VLDL 8.8 02/25/2022 1549   LDLCALC 76 02/25/2022 1549   LDLCALC 89 06/08/2020 1555   LDLDIRECT 102.1 10/10/2010 1045        chronic CHF diastolic - last echo October 2022 Preserved EF  On lasix 40 mg BID      DM 2 -  Lab Results  Component Value Date   HGBA1C 6.5 (A) 02/25/2022    diet controlled   Hypothyroidism:  Lab Results  Component Value Date   TSH  1.22 02/25/2022   on synthroid       COPD -  followed by pulmonology  on baseline oxygen  4L,   Duoneb, trelegy    OSA - noncompliant with BipAP  Hx of remote DVT not on anticoagulation  While in ER:   Desats down to 82% on 4L with ambulation     CXR - 1. No acute cardiopulmonary process. 2. Stable right upper lobe nodular density.    CTA chest - no PE COPD, chronic pulmonary nodules  Following Medications were ordered in ER: Medications  sodium chloride (PF) 0.9 % injection (has no administration in time range)  predniSONE (DELTASONE) tablet 60 mg (has no administration in time range)  albuterol (PROVENTIL) (2.5 MG/3ML) 0.083% nebulizer solution 5 mg (has no administration in time range)  ipratropium (ATROVENT) nebulizer solution 0.5 mg (has no administration in time range)  iohexol (OMNIPAQUE) 350 MG/ML injection 75 mL (75 mLs Intravenous Contrast Given 04/27/22 2030)       ED Triage Vitals  Enc Vitals Group     BP 04/27/22 1630 121/67     Pulse Rate 04/27/22 1630 89     Resp 04/27/22 1630 12     Temp 04/27/22 1630 98.7 F (37.1 C)  Temp src --      SpO2 04/27/22 1630 99 %     Weight 04/27/22 1759 115 lb 12.8 oz (52.5 kg)     Height 04/27/22 1759 5' (1.524 m)     Head Circumference --      Peak Flow --      Pain Score 04/27/22 2015 0     Pain Loc --      Pain Edu? --      Excl. in [place]? --   TMAX(24)@     _________________________________________ Significant initial  Findings: Abnormal Labs Reviewed  CBC WITH DIFFERENTIAL/PLATELET - Abnormal; Notable for the following components:      Result Value   MCV 103.2 (*)    MCHC 29.7 (*)    Platelets 149 (*)    All other components within normal limits  COMPREHENSIVE METABOLIC PANEL - Abnormal; Notable for the following components:   Chloride 91 (*)    CO2 42 (*)    Glucose, Bld 103 (*)    All other components within normal limits  BLOOD GAS, VENOUS - Abnormal; Notable for the following components:   pCO2, Ven  105 (*)    pO2, Ven 49 (*)    Bicarbonate 49.3 (*)    Acid-Base Excess 17.2 (*)    All other components within normal limits     _________________________ Troponin  8-8 ECG: Ordered Personally reviewed and interpreted by me showing: HR : 87 Rhythm:Sinus rhythm Multiple premature complexes, vent & supraven Right atrial enlargement Anterior Q waves, possibly due to LVH No significant change since last tracing QTC 447   The recent clinical data is shown below. Vitals:   04/27/22 1900 04/27/22 1930 04/27/22 2100 04/27/22 2230  BP: 111/67 123/60 122/62 106/67  Pulse: 84 93 100 (!) 113  Resp: '20 16 19 18  '$ Temp:      SpO2: 100% 92% 90% 98%  Weight:      Height:         WBC     Component Value Date/Time   WBC 6.4 04/27/2022 1642   LYMPHSABS 1.5 04/27/2022 1642   LYMPHSABS 1.7 08/06/2016 1404   MONOABS 0.7 04/27/2022 1642   MONOABS 0.5 08/06/2016 1404   EOSABS 0.3 04/27/2022 1642   EOSABS 0.4 08/06/2016 1404   BASOSABS 0.0 04/27/2022 1642   BASOSABS 0.0 08/06/2016 1404       UA  ordered      _______________________________________________ Hospitalist was called for admission for   COPD exacerbation ([place])     The following Work up has been ordered so far:  Orders Placed This Encounter  Procedures   DG Chest Port 1 View   CT Angio Chest PE W and/or Wo Contrast   Brain natriuretic peptide   CBC with Differential/Platelet   Comprehensive metabolic panel   Blood gas, venous (at [facility] and [facility], not at [facility])   Weigh patient   Check Pulse Oximetry while ambulating   Consult to hospitalist   I-Stat venous blood gas, ED   ED EKG     OTHER Significant initial  Findings:   Recent Labs  Lab 04/27/22 1642  NA 142  K 4.0  CO2 42*  GLUCOSE 103*  BUN 14  CREATININE 0.68  CALCIUM 9.7    Cr  stable,   Lab Results  Component Value Date   CREATININE 0.68 04/27/2022   CREATININE 0.79 02/25/2022   CREATININE 0.86 08/01/2021    Recent Labs  Lab 04/27/22 1642   AST  34  ALT 22  ALKPHOS 57  BILITOT 1.0  PROT 7.9  ALBUMIN 4.2   Lab Results  Component Value Date   CALCIUM 9.7 04/27/2022   PHOS 3.0 05/26/2018          Plt: Lab Results  Component Value Date   PLT 149 (L) 04/27/2022      Venous  Blood Gas result:  pH  Latest Reference Range & Units Most Recent  pH, Ven 7.25 - 7.43  7.28 04/27/22 17:46  pCO2, Ven 44 - 60 mmHg 105 (HH) 04/27/22 17:46  pO2, Ven 32 - 45 mmHg 49 (H) 04/27/22 17:46  (HH): Data is critically high (H): Data is abnormally high  ABG    Component Value Date/Time   PHART 7.276 (L) 03/01/2020 1532   PCO2ART 110 (HH) 03/01/2020 1532   PO2ART 73.5 (L) 03/01/2020 1532   HCO3 49.3 (H) 04/27/2022 1746   TCO2 48 (H) 03/16/2020 0001   O2SAT 79.1 04/27/2022 1746         Recent Labs  Lab 04/27/22 1642  WBC 6.4  NEUTROABS 3.9  HGB 13.3  HCT 44.8  MCV 103.2*  PLT 149*    HG/HCT   stable,      Component Value Date/Time   HGB 13.3 04/27/2022 1642   HGB 14.1 08/06/2016 1404   HCT 44.8 04/27/2022 1642   HCT 33.9 (L) 03/28/2018 0336   HCT 44.6 08/06/2016 1404   MCV 103.2 (H) 04/27/2022 1642   MCV 92.3 08/06/2016 1404      BNP (last 3 results) Recent Labs    07/30/21 1521 04/27/22 1642  BNP 28.0 16.6      DM  labs:  HbA1C: Recent Labs    02/25/22 1532  HGBA1C 6.5*         Cultures:    Component Value Date/Time   SDES BLOOD RIGHT ANTECUBITAL 03/01/2020 1217   SDES BLOOD LEFT HAND 03/01/2020 1217   SPECREQUEST  03/01/2020 1217    BOTTLES DRAWN AEROBIC AND ANAEROBIC Blood Culture results may not be optimal due to an inadequate volume of blood received in culture bottles   SPECREQUEST  03/01/2020 1217    AEROBIC BOTTLE ONLY Blood Culture results may not be optimal due to an inadequate volume of blood received in culture bottles   CULT  03/01/2020 1217    NO GROWTH 5 DAYS Performed at [facility], [place] [address] [place], [place] [address]    CULT  03/01/2020 1217    NO GROWTH 5  DAYS Performed at [facility], [place] [address], [place], [place] [address]    REPTSTATUS 03/06/2020 FINAL 03/01/2020 1217   REPTSTATUS 03/06/2020 FINAL 03/01/2020 1217     Radiological Exams on Admission: CT Angio Chest PE W and/or Wo Contrast  Result Date: 04/27/2022 CLINICAL DATA:  Pulmonary embolism suspected, high probability. Chronic shortness of breath, chest pain, and COPD. EXAM: CT ANGIOGRAPHY CHEST WITH CONTRAST TECHNIQUE: Multidetector CT imaging of the chest was performed using the standard protocol during bolus administration of intravenous contrast. Multiplanar CT image reconstructions and MIPs were obtained to evaluate the vascular anatomy. RADIATION DOSE REDUCTION: This exam was performed according to the departmental dose-optimization program which includes automated exposure control, adjustment of the mA and/or kV according to patient size and/or use of iterative reconstruction technique. CONTRAST:  72m OMNIPAQUE IOHEXOL 350 MG/ML SOLN COMPARISON:  07/30/2021. FINDINGS: Cardiovascular: The heart is normal in size and there is a trace pericardial effusion. Scattered coronary artery  calcifications are noted. There is atherosclerotic calcification of the aorta without evidence of aneurysm. The pulmonary trunk is normal in caliber. No pulmonary artery filling defect is identified. Mediastinum/Nodes: No enlarged mediastinal, hilar, or axillary lymph nodes. Thyroid gland, trachea, and esophagus demonstrate no significant findings. Lungs/Pleura: Emphysematous changes are present in the lungs. Mild atelectasis or infiltrate is noted at the lung bases. No effusion or pneumothorax. There is a stable nodular opacity in the left upper lobe measuring 1 cm, axial image 43. There is a stable 2 mm nodule in the left upper lobe, axial image 46. Stable scarring or atelectasis is noted in the posterior segment of the right upper lobe, axial image 58. There is a stable nodular opacity in the right upper  lobe measuring 6 mm, axial image 55. Upper Abdomen: No acute abnormality. Musculoskeletal: Degenerative changes are present in the thoracolumbar spine. No acute osseous abnormality. Review of the MIP images confirms the above findings. IMPRESSION: 1. No evidence of pulmonary embolism. 2. Mild atelectasis or infiltrate at the lung bases. Stable atelectasis or scarring in the posterior segment of the right upper lobe. 3. Stable bilateral pulmonary nodules, the largest measuring 1 cm. Consider one of the following in 3 months for both low-risk and high-risk individuals: (a) repeat chest CT, (b) follow-up PET-CT, or (c) tissue sampling. This recommendation follows the consensus statement: Guidelines for Management of Incidental Pulmonary Nodules Detected on CT Images: From the Fleischner Society 2017; Radiology 2017; 284:228-243. 4. Emphysema. 5. Aortic atherosclerosis with coronary artery calcifications. Electronically Signed   By: Brett Fairy M.D.   On: 04/27/2022 21:13   DG Chest Port 1 View  Result Date: 04/27/2022 CLINICAL DATA:  Shortness of breath. EXAM: PORTABLE CHEST 1 VIEW COMPARISON:  Chest x-ray 07/30/2021.  CT chest 07/30/2021. FINDINGS: Right upper lobe nodular density appears unchanged. There is no new focal lung infiltrate, pleural effusion or pneumothorax. Cardiomediastinal silhouette is within normal limits. No acute fractures. IMPRESSION: 1. No acute cardiopulmonary process. 2. Stable right upper lobe nodular density. Electronically Signed   By: Ronney Asters M.D.   On: 04/27/2022 16:58   _______________________________________________________________________________________________________ Latest  Blood pressure 106/67, pulse (!) 113, temperature 98.7 F (37.1 C), resp. rate 18, height 5' (1.524 m), weight 52.5 kg, SpO2 98 %.   Vitals  labs and radiology finding personally reviewed  Review of Systems:    Pertinent positives include:  shortness of breath at rest.  dyspnea on  exertion  Constitutional:  No weight loss, night sweats, Fevers, chills, fatigue, weight loss  HEENT:  No headaches, Difficulty swallowing,Tooth/dental problems,Sore throat,  No sneezing, itching, ear ache, nasal congestion, post nasal drip,  Cardio-vascular:  No chest pain, Orthopnea, PND, anasarca, dizziness, palpitations.no Bilateral lower extremity swelling  GI:  No heartburn, indigestion, abdominal pain, nausea, vomiting, diarrhea, change in bowel habits, loss of appetite, melena, blood in stool, hematemesis Resp:  no , No excess mucus, no productive cough, No non-productive cough, No coughing up of blood.No change in color of mucus.No wheezing. Skin:  no rash or lesions. No jaundice GU:  no dysuria, change in color of urine, no urgency or frequency. No straining to urinate.  No flank pain.  Musculoskeletal:  No joint pain or no joint swelling. No decreased range of motion. No back pain.  Psych:  No change in mood or affect. No depression or anxiety. No memory loss.  Neuro: no localizing neurological complaints, no tingling, no weakness, no double vision, no gait abnormality, no slurred speech, no confusion  All systems reviewed and apart from [place] all are negative _______________________________________________________________________________________________ Past Medical History:   Past Medical History:  Diagnosis Date   ALLERGIC RHINITIS 05/01/2007   Anxiety    Arthritis    "hands" (12/28/2015)   ASTHMA 05/01/2007   "since I was a child"   Cancer ([place])    LUNG   CHF (congestive heart failure) ([place])    Chronic bronchitis (HCC)    COPD (chronic obstructive pulmonary disease) ([place])    DEPRESSION 07/21/2008   DVT (deep venous thrombosis) ([place]) 1960s   LLE   FATIGUE 10/10/2010   HYPERTENSION 05/01/2007   HYPOTHYROIDISM 08/23/2009   s/p RAI   LUMBAR RADICULOPATHY, RIGHT 08/25/2008   On home oxygen therapy    "3-4L; qd; all the time" (12/28/2015)   OSTEOPOROSIS 05/01/2007    SHOULDER PAIN, LEFT 08/23/2009   SINUSITIS, CHRONIC 10/10/2010      Past Surgical History:  Procedure Laterality Date   APPENDECTOMY     DILATION AND CURETTAGE OF UTERUS  multiple   history of multiple dialations and curettages and miscarriages, unfortunately never carrying a child to term   ECTOPIC PREGNANCY SURGERY  "late '60s or early '70s   ELECTROCARDIOGRAM  06/20/2006   OOPHORECTOMY Right     Social History:  Ambulatory   cane,      reports that she quit smoking about 5 years ago. Her smoking use included cigarettes. She started smoking about 58 years ago. She has a 52.00 pack-year smoking history. She has never used smokeless tobacco. She reports that she does not currently use alcohol. She reports that she does not currently use drugs.     Family History:   Family History  Problem Relation Age of Onset   Lung cancer Father    Alcohol abuse Brother    Diabetes Brother    Hypertension Other    Stroke Mother    Thyroid disease Mother    Asthma Other        maternal aunts   Breast cancer Neg Hx    ______________________________________________________________________________________________ Allergies: Allergies  Allergen Reactions   Alendronate Sodium Other (See Comments)    Patient does not remember this reaction     Prior to Admission medications   Medication Sig Start Date End Date Taking? Authorizing Provider  albuterol (VENTOLIN HFA) 108 (90 Base) MCG/ACT inhaler INHALE 1 TO 2 PUFFS INTO THE LUNGS EVERY 6 HOURS AS NEEDED FOR WHEEZING OR SHORTNESS OF BREATH 04/14/22   Biagio Borg, MD  aspirin EC 81 MG EC tablet Take 1 tablet (81 mg total) by mouth daily. 03/27/16   Velvet Bathe, MD  atorvastatin (LIPITOR) 10 MG tablet TAKE 1 TABLET BY MOUTH DAILY. Patient taking differently: Take 10 mg by mouth daily. 07/12/21   Biagio Borg, MD  citalopram (CELEXA) 20 MG tablet TAKE ONE TABLET BY MOUTH DAILY Patient taking differently: Take 20 mg by mouth daily. TAKE ONE  TABLET BY MOUTH DAILY 04/19/21   Biagio Borg, MD  Fluticasone-Umeclidin-Vilant (TRELEGY ELLIPTA) 200-62.5-25 MCG/INH AEPB Inhale 1 puff into the lungs daily. 04/19/21   Biagio Borg, MD  furosemide (LASIX) 40 MG tablet 1 tab by mouth twice per day Patient taking differently: Take 40 mg by mouth 2 (two) times daily. 04/19/21   Biagio Borg, MD  guaiFENesin (ROBITUSSIN) 100 MG/5ML liquid Take 5 mLs by mouth every 4 (four) hours as needed for cough or to loosen phlegm. Mucinex chest congestion (liquid) Patient not taking: Reported on 03/05/2022  [provider]  ipratropium-albuterol (DUONEB) 0.5-2.5 (3) MG/3ML SOLN Take 3 mLs by nebulization every 6 (six) hours as needed. 11/05/21   Rigoberto Noel, MD  levothyroxine (SYNTHROID) 75 MCG tablet Take 1 tablet (75 mcg total) by mouth daily before breakfast. TAKE ONE TABLET BY MOUTH EARLY MORNING 02/26/22   Shamleffer, Melanie Crazier, MD  OXYGEN Inhale 3 L into the lungs continuous. 24/7    [provider]  potassium chloride (KLOR-CON) 10 MEQ tablet TAKE 1 TABLET BY MOUTH 2 TIMES DAILY. Patient taking differently: Take 10 mEq by mouth 2 (two) times daily. 04/19/21   Biagio Borg, MD    ___________________________________________________________________________________________________ Physical Exam:    04/27/2022   10:30 PM 04/27/2022    9:00 PM 04/27/2022    7:30 PM  Vitals with BMI  Systolic 465 681 275  Diastolic 67 62 60  Pulse 170 100 93     1. General:  in No  Acute distress    Chronically ill   -appearing 2. Psychological: Alert and   Oriented 3. Head/ENT:  Dry Mucous Membranes                          Head Non traumatic, neck supple                           Poor Dentition 4. SKIN:  decreased Skin turgor,  Skin clean Dry and intact no rash 5. Heart: Regular rate and rhythm no  Murmur, no Rub or gallop 6. Lungs:occasional wheezes no crackles diminished air movement bilaterally 7. Abdomen: Soft,  non-tender, Non distended   bowel sounds present 8. Lower extremities: no clubbing, cyanosis,  bilateral edema 9. Neurologically Grossly intact, moving all 4 extremities equally   10. MSK: Normal range of motion    Chart has been reviewed  ______________________________________________________________________________________________  Assessment/Plan  76 y.o. female with medical history significant of end- stage COPD on 4L, DM2, osteoporosis, hypothyrodism Graves' Disease many years ago, she is S/P RAI ablation in 2006 , HLD,  admitted for   COPD exacerbation ([place])    Present on Admission:  COPD with acute exacerbation ([place])  Chronic diastolic congestive heart failure (HCC)  Acute on chronic respiratory failure with hypoxia (HCC)  Cor pulmonale ([place])  Diabetes mellitus due to underlying condition without complication, without long-term current use of insulin (HCC)  Essential hypertension  Hypothyroidism  Solitary pulmonary nodule     Chronic diastolic congestive heart failure (HCC) Continue lasix 40 mg po BID and obtain echo Troponin stable   Acute on chronic respiratory failure with hypoxia ([place])  this patient has acute respiratory failure with Hypoxia and  Hypercarbia as documented by the presence of following: O2 saturatio< 90% on RA  pH <7.35 with pCO2 >50 Likely due to: COPD exacerbation  Provide O2 therapy and titrate as needed  Continuous pulse ox   check Pulse ox with ambulation prior to discharge   may need  TC consult for home O2 set up  flutter valve ordered   COPD with acute exacerbation ([place])  -  - Will initiate: Steroid taper  -  Antibiotics rocephin, - Albuterol  PRN, - scheduled duoneb,  -  Breo or Dulera at discharge   -  Mucinex.  Titrate O2 to saturation >90%. Follow patients respiratory status.  VBG showing compensated chronic hypercarbia -   PCCM consultedto see in AM sent e-link msg  - BiPAP  ordered given hypercarbia, pt supposed to be on BIpap at night but could not get  it sent back    Currently mentating well    Cor pulmonale ([place]) Suspect the cause for peripheral edema  will repeat echo  Diabetes mellitus due to underlying condition without complication, without long-term current use of insulin (HCC) Order sliding scale, Pt not on home meds   Essential hypertension Continue lasix 40 mg po BID  Hypothyroidism - Check TSH continue home medications at current dose   Solitary pulmonary nodule Chronic will need to follow up with pulmonology    Other plan as per orders.  DVT prophylaxis:   Lovenox      Code Status:    Code Status: Prior FULL CODE   as per patient  I had personally discussed CODE STATUS with patient     Family Communication:   Family not at  Bedside    Disposition Plan:       To home once workup is complete and patient is stable   Following barriers for discharge:                                                    Will likely need home health, home O2, set up                           Will need consultants to evaluate patient prior to discharge                       Would benefit from PT/OT eval prior to DC  Ordered                                     Transition of care consulted                   Nutrition    consulted                                 Consults called:    sent epic chat msg to pulmonary to add to the list in am will see in a.m.  Admission status:  ED Disposition     ED Disposition  Admit   Condition  --   [place]: [place] [[id number]]  Level of Care: Progressive [102]  Admit to Progressive based on following criteria: RESPIRATORY PROBLEMS hypoxemic/hypercapnic respiratory failure that is responsive to NIPPV (BiPAP) or High Flow Nasal Cannula (6-80 lpm). Frequent assessment/intervention, no > Q2 hrs < Q4 hrs, to maintain oxygenation and pulmonary hygiene.  May admit patient to [facility] or [facility] if equivalent level of care is available:: No  Covid  Evaluation: Asymptomatic - no recent exposure (last 10 days) testing not required  Diagnosis: COPD with acute exacerbation [facility]) [[id number]]  Admitting Physician: Toy Baker [[id number]]  Attending Physician: Toy Baker [[id number]]  Certification:: I certify this patient will need inpatient services for at least 2 midnights  Estimated Length of Stay: 2           inpatient     I Expect 2 midnight stay secondary to severity  of patient's current illness need for inpatient interventions justified by the following:  hemodynamic instability despite optimal treatment (t hypoxia, hypercapnia)   Severe lab/radiological/exam abnormalities including:     and extensive comorbidities including:  DM2   CHF   COPD/asthma   That are currently affecting medical management.   I expect  patient to be hospitalized for 2 midnights requiring inpatient medical care.  Patient is at high risk for adverse outcome (such as loss of life or disability) if not treated.  Indication for inpatient stay as follows:    Hemodynamic instability despite maximal medical therapy,    New or worsening hypoxia   Need for IV antibiotics , need for biPAP    Level of care    progressive tele indefinitely please discontinue once patient no longer qualifies COVID-19 Labs    Aleane Wesenberg 04/28/2022, 12:35 AM    [facility]     after 2 AM please page floor coverage PA If 7AM-7PM, please contact the day team taking care of the patient using [email or web address]   Patient was evaluated in the context of the global COVID-19 pandemic, which necessitated consideration that the patient might be at risk for infection with the SARS-CoV-2 virus that causes COVID-19. Institutional protocols and algorithms that pertain to the evaluation of patients at risk for COVID-19 are in a state of rapid change based on information released by regulatory bodies including the CDC and federal and state organizations. These policies  and algorithms were followed during the patient's care.

## 2022-04-27 NOTE — Assessment & Plan Note (Signed)
Order sliding scale, Pt not on home meds

## 2022-04-27 NOTE — ED Notes (Signed)
Pt ambulate to and from restroom on 4L. While in rm pt 02 drop down to 93% HR 140. Walking back to rm PT 02 95% HR 140. PT sitting on side of bed HR 142. RN have been made aware.

## 2022-04-27 NOTE — ED Notes (Signed)
Pt care taken, complaining of shortness of breath

## 2022-04-28 ENCOUNTER — Other Ambulatory Visit (HOSPITAL_COMMUNITY): Payer: TRICARE For Life (TFL)

## 2022-04-28 DIAGNOSIS — I5032 Chronic diastolic (congestive) heart failure: Secondary | ICD-10-CM | POA: Diagnosis not present

## 2022-04-28 DIAGNOSIS — J9621 Acute and chronic respiratory failure with hypoxia: Secondary | ICD-10-CM | POA: Diagnosis not present

## 2022-04-28 DIAGNOSIS — J441 Chronic obstructive pulmonary disease with (acute) exacerbation: Secondary | ICD-10-CM | POA: Diagnosis not present

## 2022-04-28 LAB — MRSA NEXT GEN BY PCR, NASAL: MRSA by PCR Next Gen: NOT DETECTED

## 2022-04-28 LAB — RESPIRATORY PANEL BY PCR

## 2022-04-28 LAB — COMPREHENSIVE METABOLIC PANEL
ALT: 20 U/L (ref 0–44)
AST: 27 U/L (ref 15–41)
Albumin: 3.7 g/dL (ref 3.5–5.0)
Alkaline Phosphatase: 52 U/L (ref 38–126)
Anion gap: 9 (ref 5–15)
BUN: 16 mg/dL (ref 8–23)
CO2: 39 mmol/L — ABNORMAL HIGH (ref 22–32)
Calcium: 9.4 mg/dL (ref 8.9–10.3)
Chloride: 89 mmol/L — ABNORMAL LOW (ref 98–111)
Creatinine, Ser: 0.62 mg/dL (ref 0.44–1.00)
GFR, Estimated: >60 mL/min (ref >60)
Glucose, Bld: 120 mg/dL — ABNORMAL HIGH (ref 70–99)
Potassium: 4.2 mmol/L (ref 3.5–5.1)
Sodium: 137 mmol/L (ref 135–145)
Total Bilirubin: 0.7 mg/dL (ref 0.3–1.2)
Total Protein: 6.9 g/dL (ref 6.5–8.1)

## 2022-04-28 LAB — BILIRUBIN, DIRECT: Bilirubin, Direct: <0.1 mg/dL (ref 0.0–0.2)

## 2022-04-28 LAB — CBC
HCT: 41.7 % (ref 36.0–46.0)
Hemoglobin: 12.3 g/dL (ref 12.0–15.0)
MCH: 30.8 pg (ref 26.0–34.0)
MCHC: 29.5 g/dL — ABNORMAL LOW (ref 30.0–36.0)
MCV: 104.5 fL — ABNORMAL HIGH (ref 80.0–100.0)
Platelets: 148 10*3/uL — ABNORMAL LOW (ref 150–400)
RBC: 3.99 MIL/uL (ref 3.87–5.11)
RDW: 13.7 % (ref 11.5–15.5)
WBC: 4.2 10*3/uL (ref 4.0–10.5)
nRBC: 0 % (ref 0.0–0.2)

## 2022-04-28 LAB — RAPID URINE DRUG SCREEN, HOSP PERFORMED
Amphetamines: NOT DETECTED
Barbiturates: NOT DETECTED
Benzodiazepines: NOT DETECTED
Cocaine: NOT DETECTED
Opiates: NOT DETECTED
Tetrahydrocannabinol: NOT DETECTED

## 2022-04-28 LAB — PHOSPHORUS: Phosphorus: 3.4 mg/dL (ref 2.5–4.6)

## 2022-04-28 LAB — CK: Total CK: 77 U/L (ref 38–234)

## 2022-04-28 LAB — MAGNESIUM: Magnesium: 1.6 mg/dL — ABNORMAL LOW (ref 1.7–2.4)

## 2022-04-28 LAB — PREALBUMIN: Prealbumin: 14.5 mg/dL — ABNORMAL LOW (ref 18–38)

## 2022-04-28 LAB — GLUCOSE, CAPILLARY
Glucose-Capillary: 152 mg/dL — ABNORMAL HIGH (ref 70–99)
Glucose-Capillary: 142 mg/dL — ABNORMAL HIGH (ref 70–99)

## 2022-04-28 LAB — CBG MONITORING, ED
Glucose-Capillary: 161 mg/dL — ABNORMAL HIGH (ref 70–99)
Glucose-Capillary: 193 mg/dL — ABNORMAL HIGH (ref 70–99)

## 2022-04-28 LAB — TSH: TSH: 0.052 u[IU]/mL — ABNORMAL LOW (ref 0.350–4.500)

## 2022-04-28 LAB — SARS CORONAVIRUS 2 BY RT PCR: SARS Coronavirus 2 by RT PCR: NEGATIVE

## 2022-04-28 MED ORDER — LEVOTHYROXINE SODIUM 50 MCG PO TABS
75.0000 ug | ORAL_TABLET | Freq: Every day | ORAL | Status: DC
  Administered 2022-04-28 – 2022-05-01 (×4): 75 ug via ORAL
  Filled 2022-04-28 (×4): qty 1

## 2022-04-28 MED ORDER — IPRATROPIUM-ALBUTEROL 0.5-2.5 (3) MG/3ML IN SOLN
3.0000 mL | Freq: Four times a day (QID) | RESPIRATORY_TRACT | Status: DC
Start: 2022-04-28 — End: 2022-04-29
  Administered 2022-04-28 (×3): 3 mL via RESPIRATORY_TRACT
  Filled 2022-04-28 (×3): qty 3

## 2022-04-28 MED ORDER — METHYLPREDNISOLONE SODIUM SUCC 40 MG IJ SOLR
40.0000 mg | Freq: Two times a day (BID) | INTRAMUSCULAR | Status: AC
  Administered 2022-04-28 (×2): 40 mg via INTRAVENOUS
  Filled 2022-04-28 (×2): qty 1

## 2022-04-28 MED ORDER — ENOXAPARIN SODIUM 40 MG/0.4ML IJ SOSY
40.0000 mg | PREFILLED_SYRINGE | INTRAMUSCULAR | Status: DC
  Administered 2022-04-28 – 2022-05-01 (×4): 40 mg via SUBCUTANEOUS
  Filled 2022-04-28 (×4): qty 0.4

## 2022-04-28 MED ORDER — ASPIRIN 81 MG PO TBEC
81.0000 mg | DELAYED_RELEASE_TABLET | Freq: Every day | ORAL | Status: DC
  Administered 2022-04-28 – 2022-05-01 (×4): 81 mg via ORAL
  Filled 2022-04-28 (×4): qty 1

## 2022-04-28 MED ORDER — SODIUM CHLORIDE 0.9 % IV SOLN: 250.0000 mL | INTRAVENOUS | Status: DC | PRN

## 2022-04-28 MED ORDER — SODIUM CHLORIDE 0.9 % IV SOLN
1.0000 g | INTRAVENOUS | Status: DC
  Administered 2022-04-28 – 2022-04-29 (×2): 1 g via INTRAVENOUS
  Filled 2022-04-28 (×2): qty 10

## 2022-04-28 MED ORDER — PREDNISONE 20 MG PO TABS
40.0000 mg | ORAL_TABLET | Freq: Every day | ORAL | Status: DC
  Administered 2022-04-29 – 2022-05-01 (×3): 40 mg via ORAL
  Filled 2022-04-28 (×4): qty 2

## 2022-04-28 MED ORDER — SODIUM CHLORIDE 0.9% FLUSH: 3.0000 mL | INTRAVENOUS | Status: DC | PRN

## 2022-04-28 MED ORDER — FUROSEMIDE 40 MG PO TABS
40.0000 mg | ORAL_TABLET | Freq: Two times a day (BID) | ORAL | Status: DC
  Administered 2022-04-28 – 2022-05-01 (×7): 40 mg via ORAL
  Filled 2022-04-28 (×7): qty 1

## 2022-04-28 MED ORDER — ACETAMINOPHEN 650 MG RE SUPP: 650.0000 mg | Freq: Four times a day (QID) | RECTAL | Status: DC | PRN

## 2022-04-28 MED ORDER — ATORVASTATIN CALCIUM 10 MG PO TABS
10.0000 mg | ORAL_TABLET | Freq: Every day | ORAL | Status: DC
  Administered 2022-04-28 – 2022-05-01 (×4): 10 mg via ORAL
  Filled 2022-04-28 (×4): qty 1

## 2022-04-28 MED ORDER — IPRATROPIUM BROMIDE 0.02 % IN SOLN: 0.5000 mg | Freq: Four times a day (QID) | RESPIRATORY_TRACT | Status: DC

## 2022-04-28 MED ORDER — GUAIFENESIN ER 600 MG PO TB12
600.0000 mg | ORAL_TABLET | Freq: Two times a day (BID) | ORAL | Status: DC
  Administered 2022-04-28 – 2022-05-01 (×7): 600 mg via ORAL
  Filled 2022-04-28 (×7): qty 1

## 2022-04-28 MED ORDER — ARFORMOTEROL TARTRATE 15 MCG/2ML IN NEBU
15.0000 ug | INHALATION_SOLUTION | Freq: Two times a day (BID) | RESPIRATORY_TRACT | Status: DC
  Administered 2022-04-28 – 2022-05-01 (×6): 15 ug via RESPIRATORY_TRACT
  Filled 2022-04-28 (×7): qty 2

## 2022-04-28 MED ORDER — CITALOPRAM HYDROBROMIDE 20 MG PO TABS
20.0000 mg | ORAL_TABLET | Freq: Every day | ORAL | Status: DC
  Administered 2022-04-28 – 2022-05-01 (×4): 20 mg via ORAL
  Filled 2022-04-28: qty 2
  Filled 2022-04-28 (×3): qty 1

## 2022-04-28 MED ORDER — LORATADINE 10 MG PO TABS
10.0000 mg | ORAL_TABLET | Freq: Every day | ORAL | Status: DC
  Administered 2022-04-28 – 2022-05-01 (×4): 10 mg via ORAL
  Filled 2022-04-28 (×4): qty 1

## 2022-04-28 MED ORDER — ACETAMINOPHEN 325 MG PO TABS: 650.0000 mg | ORAL_TABLET | Freq: Four times a day (QID) | ORAL | Status: DC | PRN

## 2022-04-28 MED ORDER — BUDESONIDE 0.5 MG/2ML IN SUSP
0.5000 mg | Freq: Two times a day (BID) | RESPIRATORY_TRACT | Status: DC
  Administered 2022-04-28 – 2022-05-01 (×7): 0.5 mg via RESPIRATORY_TRACT
  Filled 2022-04-28 (×7): qty 2

## 2022-04-28 MED ORDER — FLUTICASONE PROPIONATE 50 MCG/ACT NA SUSP
1.0000 | Freq: Every day | NASAL | Status: DC
Start: 2022-04-28 — End: 2022-05-01
  Administered 2022-04-29 – 2022-05-01 (×3): 1 via NASAL
  Filled 2022-04-28: qty 16

## 2022-04-28 MED ORDER — SODIUM CHLORIDE 0.9% FLUSH
3.0000 mL | Freq: Two times a day (BID) | INTRAVENOUS | Status: DC
  Administered 2022-04-28 – 2022-05-01 (×6): 3 mL via INTRAVENOUS

## 2022-04-28 MED ORDER — HYDROCODONE-ACETAMINOPHEN 5-325 MG PO TABS
1.0000 | ORAL_TABLET | ORAL | Status: DC | PRN
  Administered 2022-04-28: 1 via ORAL
  Filled 2022-04-28: qty 1

## 2022-04-28 NOTE — Progress Notes (Signed)
PT set up on dream station bileval ventilation.

## 2022-04-28 NOTE — Progress Notes (Signed)
PROGRESS NOTE  Jasmine [name] HCW:[id number] DOB: 05-14-1946 DOA: 04/27/2022 PCP: Biagio Borg, MD   LOS: 1 day   Brief Narrative / Interim history: 76 year old female with history of end-stage COPD on 4 L at home, DM2, osteoporosis, hypothyroidism, comes into the hospital with worsening shortness of breath.  This has been going on for the past few days, she ran out of her rescue inhaler, and felt progressively worse and called 911  Subjective / 24h Interval events: She is feeling a little bit better this morning, has BiPAP on  Assesement and Plan: Principal Problem:   COPD with acute exacerbation ([place]) Active Problems:   Essential hypertension   Cor pulmonale (HCC)   Acute on chronic respiratory failure with hypoxia (HCC)   Chronic diastolic congestive heart failure (HCC)   Solitary pulmonary nodule   Hypothyroidism   Diabetes mellitus due to underlying condition without complication, without long-term current use of insulin (HCC)  Principal problem Acute on chronic hypoxic respiratory failure due to COPD exacerbation-continue supportive care with IV steroids, nebulizers, antibiotics.  CT angiogram on admission showed mild infiltrates at the lung bases versus atelectasis.  Continue incentive spirometry, flutter valve, closely monitor.  COVID, RVP negative.  Active problems Chronic diastolic CHF-continue Lasix.  Echo has been ordered by the admitting  DM2-diet controlled, given sliding scale  CBG (last 3)  Recent Labs    04/28/22 0828  GLUCAP 161*   Lab Results  Component Value Date   HGBA1C 6.5 (A) 02/25/2022   Hyperlipidemia-continue statin  Essential hypertension-continue furosemide  Hypothyroidism-continue Synthroid  Solitary pulmonary nodule-Chronic will need to follow up with pulmonology    Scheduled Meds:  aspirin EC  81 mg Oral Daily   atorvastatin  10 mg Oral Daily   citalopram  20 mg Oral Daily   enoxaparin (LOVENOX) injection  40 mg Subcutaneous Q24H    furosemide  40 mg Oral BID   guaiFENesin  600 mg Oral BID   insulin aspart  0-9 Units Subcutaneous TID WC   ipratropium-albuterol  3 mL Nebulization Q6H   levothyroxine  75 mcg Oral QAC breakfast   methylPREDNISolone (SOLU-MEDROL) injection  40 mg Intravenous Q12H   Followed by   Derrill Memo ON 04/29/2022] predniSONE  40 mg Oral Q breakfast   sodium chloride flush  3 mL Intravenous Q12H   Continuous Infusions:  sodium chloride     cefTRIAXone (ROCEPHIN)  IV Stopped (04/28/22 0715)   PRN Meds:.sodium chloride, acetaminophen **OR** acetaminophen, HYDROcodone-acetaminophen, sodium chloride flush  Diet Orders (From admission, onward)     Start     Ordered   04/28/22 0650  Diet regular Room service appropriate? Yes; Fluid consistency: Thin  Diet effective now       Question Answer Comment  Room service appropriate? Yes   Fluid consistency: Thin      04/28/22 0649            DVT prophylaxis: enoxaparin (LOVENOX) injection 40 mg Start: 04/28/22 1000   Lab Results  Component Value Date   PLT 148 (L) 04/28/2022      Code Status: Full Code  Family Communication: no family at bedside   Status is: Inpatient Remains inpatient appropriate because: still on bipap this morning   Level of care: Progressive  Consultants:  none  Procedures:  2D echo: pending  Objective: Vitals:   04/28/22 0830 04/28/22 0832 04/28/22 0900 04/28/22 0930  BP: 127/70  (!) 112/56 101/80  Pulse: 85  89 (!) 103  Resp: 18  15 (!) 21  Temp:      SpO2: 94% 97% (!) 89% 91%  Weight:      Height:       No intake or output data in the 24 hours ending 04/28/22 1050 Wt Readings from Last 3 Encounters:  04/27/22 52.5 kg  03/05/22 52.2 kg  02/25/22 53 kg    Examination:  Constitutional: She is comfortable on BiPAP Eyes: no scleral icterus ENMT: Mucous membranes are moist.  Neck: normal, supple Respiratory: Breathing with BiPAP, no wheezing heard Cardiovascular: Regular rate and rhythm, no  murmurs / rubs / gallops.  Lower extremity swelling Abdomen: non distended, no tenderness. Bowel sounds positive.  Musculoskeletal: no clubbing / cyanosis.  Skin: no rashes Neurologic: Nonfocal   Data Reviewed: I have independently reviewed following labs and imaging studies   CBC Recent Labs  Lab 04/27/22 1642 04/28/22 0500  WBC 6.4 4.2  HGB 13.3 12.3  HCT 44.8 41.7  PLT 149* 148*  MCV 103.2* 104.5*  MCH 30.6 30.8  MCHC 29.7* 29.5*  RDW 13.6 13.7  LYMPHSABS 1.5  --   MONOABS 0.7  --   EOSABS 0.3  --   BASOSABS 0.0  --     Recent Labs  Lab 04/27/22 1642  NA 142  K 4.0  CL 91*  CO2 42*  GLUCOSE 103*  BUN 14  CREATININE 0.68  CALCIUM 9.7  AST 34  ALT 22  ALKPHOS 57  BILITOT 1.0  ALBUMIN 4.2  BNP 16.6    ------------------------------------------------------------------------------------------------------------------ No results for input(s): "CHOL", "HDL", "LDLCALC", "TRIG", "CHOLHDL", "LDLDIRECT" in the last 72 hours.  Lab Results  Component Value Date   HGBA1C 6.5 (A) 02/25/2022   ------------------------------------------------------------------------------------------------------------------ No results for input(s): "TSH", "T4TOTAL", "T3FREE", "THYROIDAB" in the last 72 hours.  Invalid input(s): "FREET3"  Cardiac Enzymes No results for input(s): "CKMB", "TROPONINI", "MYOGLOBIN" in the last 168 hours.  Invalid input(s): "CK" ------------------------------------------------------------------------------------------------------------------    Component Value Date/Time   BNP 16.6 04/27/2022 1642    CBG: Recent Labs  Lab 04/28/22 0828  GLUCAP 161*    Recent Results (from the past 240 hour(s))  SARS Coronavirus 2 by RT PCR (hospital order, performed in [facility] hospital lab) *cepheid single result test* Anterior Nasal Swab     Status: None   Collection Time: 04/28/22 12:35 AM   Specimen: Anterior Nasal Swab  Result Value Ref Range Status    SARS Coronavirus 2 by RT PCR NEGATIVE NEGATIVE Final    Comment: (NOTE) SARS-CoV-2 target nucleic acids are NOT DETECTED.  The SARS-CoV-2 RNA is generally detectable in upper and lower respiratory specimens during the acute phase of infection. The lowest concentration of SARS-CoV-2 viral copies this assay can detect is 250 copies / mL. A negative result does not preclude SARS-CoV-2 infection and should not be used as the sole basis for treatment or other patient management decisions.  A negative result may occur with improper specimen collection / handling, submission of specimen other than nasopharyngeal swab, presence of viral mutation(s) within the areas targeted by this assay, and inadequate number of viral copies (<250 copies / mL). A negative result must be combined with clinical observations, patient history, and epidemiological information.  Fact Sheet for Patients:   https://www.patel.info/  Fact Sheet for Healthcare Providers: https://hall.com/  This test is not yet approved or  cleared by the [place] FDA and has been authorized for detection and/or diagnosis of SARS-CoV-2 by FDA under an Emergency Use Authorization (EUA).  This EUA  will remain in effect (meaning this test can be used) for the duration of the COVID-19 declaration under Section 564(b)(1) of the Act, 21 U.S.C. section 360bbb-3(b)(1), unless the authorization is terminated or revoked sooner.  Performed at [facility], [place] [address] [place], [place] [address]   Respiratory (~20 pathogens) panel by PCR     Status: None   Collection Time: 04/28/22  4:36 AM   Specimen: Nasopharyngeal Swab; Respiratory  Result Value Ref Range Status   Adenovirus NOT DETECTED NOT DETECTED Final   Coronavirus 229E NOT DETECTED NOT DETECTED Final    Comment: (NOTE) The Coronavirus on the Respiratory Panel, DOES NOT test for the novel  Coronavirus (2019  nCoV)    Coronavirus HKU1 NOT DETECTED NOT DETECTED Final   Coronavirus NL63 NOT DETECTED NOT DETECTED Final   Coronavirus OC43 NOT DETECTED NOT DETECTED Final   Metapneumovirus NOT DETECTED NOT DETECTED Final   Rhinovirus / Enterovirus NOT DETECTED NOT DETECTED Final   Influenza A NOT DETECTED NOT DETECTED Final   Influenza B NOT DETECTED NOT DETECTED Final   Parainfluenza Virus 1 NOT DETECTED NOT DETECTED Final   Parainfluenza Virus 2 NOT DETECTED NOT DETECTED Final   Parainfluenza Virus 3 NOT DETECTED NOT DETECTED Final   Parainfluenza Virus 4 NOT DETECTED NOT DETECTED Final   Respiratory Syncytial Virus NOT DETECTED NOT DETECTED Final   Bordetella pertussis NOT DETECTED NOT DETECTED Final   Bordetella Parapertussis NOT DETECTED NOT DETECTED Final   Chlamydophila pneumoniae NOT DETECTED NOT DETECTED Final   Mycoplasma pneumoniae NOT DETECTED NOT DETECTED Final    Comment: Performed at [facility], [place]. [address] [place], [place] [address]  MRSA Next Gen by PCR, Nasal     Status: None   Collection Time: 04/28/22  4:36 AM   Specimen: Nasopharyngeal Swab; Nasal Swab  Result Value Ref Range Status   MRSA by PCR Next Gen NOT DETECTED NOT DETECTED Final    Comment: (NOTE) The GeneXpert MRSA Assay (FDA approved for NASAL specimens only), is one component of a comprehensive MRSA colonization surveillance program. It is not intended to diagnose MRSA infection nor to guide or monitor treatment for MRSA infections. Test performance is not FDA approved in patients less than 44 years old. Performed at [facility], [place] [address], [place],  [address]      Radiology Studies: CT Angio Chest PE W and/or Wo Contrast  Result Date: 04/27/2022 CLINICAL DATA:  Pulmonary embolism suspected, high probability. Chronic shortness of breath, chest pain, and COPD. EXAM: CT ANGIOGRAPHY CHEST WITH CONTRAST TECHNIQUE: Multidetector CT imaging of the chest was performed  using the standard protocol during bolus administration of intravenous contrast. Multiplanar CT image reconstructions and MIPs were obtained to evaluate the vascular anatomy. RADIATION DOSE REDUCTION: This exam was performed according to the departmental dose-optimization program which includes automated exposure control, adjustment of the mA and/or kV according to patient size and/or use of iterative reconstruction technique. CONTRAST:  71m OMNIPAQUE IOHEXOL 350 MG/ML SOLN COMPARISON:  07/30/2021. FINDINGS: Cardiovascular: The heart is normal in size and there is a trace pericardial effusion. Scattered coronary artery calcifications are noted. There is atherosclerotic calcification of the aorta without evidence of aneurysm. The pulmonary trunk is normal in caliber. No pulmonary artery filling defect is identified. Mediastinum/Nodes: No enlarged mediastinal, hilar, or axillary lymph nodes. Thyroid gland, trachea, and esophagus demonstrate no significant findings. Lungs/Pleura: Emphysematous changes are present in the lungs. Mild atelectasis or infiltrate is noted  at the lung bases. No effusion or pneumothorax. There is a stable nodular opacity in the left upper lobe measuring 1 cm, axial image 43. There is a stable 2 mm nodule in the left upper lobe, axial image 46. Stable scarring or atelectasis is noted in the posterior segment of the right upper lobe, axial image 58. There is a stable nodular opacity in the right upper lobe measuring 6 mm, axial image 55. Upper Abdomen: No acute abnormality. Musculoskeletal: Degenerative changes are present in the thoracolumbar spine. No acute osseous abnormality. Review of the MIP images confirms the above findings. IMPRESSION: 1. No evidence of pulmonary embolism. 2. Mild atelectasis or infiltrate at the lung bases. Stable atelectasis or scarring in the posterior segment of the right upper lobe. 3. Stable bilateral pulmonary nodules, the largest measuring 1 cm. Consider one of  the following in 3 months for both low-risk and high-risk individuals: (a) repeat chest CT, (b) follow-up PET-CT, or (c) tissue sampling. This recommendation follows the consensus statement: Guidelines for Management of Incidental Pulmonary Nodules Detected on CT Images: From the Fleischner Society 2017; Radiology 2017; 284:228-243. 4. Emphysema. 5. Aortic atherosclerosis with coronary artery calcifications. Electronically Signed   By: Brett Fairy M.D.   On: 04/27/2022 21:13   DG Chest Port 1 View  Result Date: 04/27/2022 CLINICAL DATA:  Shortness of breath. EXAM: PORTABLE CHEST 1 VIEW COMPARISON:  Chest x-ray 07/30/2021.  CT chest 07/30/2021. FINDINGS: Right upper lobe nodular density appears unchanged. There is no new focal lung infiltrate, pleural effusion or pneumothorax. Cardiomediastinal silhouette is within normal limits. No acute fractures. IMPRESSION: 1. No acute cardiopulmonary process. 2. Stable right upper lobe nodular density. Electronically Signed   By: Ronney Asters M.D.   On: 04/27/2022 16:58     Marzetta Board, MD, PhD [facility]  Between 7 am - 7 pm I am available, please contact me via Amion (for emergencies) or Securechat (non urgent messages)  Between 7 pm - 7 am I am not available, please contact night coverage MD/APP via Amion

## 2022-04-28 NOTE — Consult Note (Signed)
NAME:  Jasmine [name], MRN:  [id number], DOB:  04/26/1946, LOS: 1 ADMISSION DATE:  04/27/2022, CONSULTATION DATE:  04/28/22 REFERRING MD:  Roel Cluck CHIEF COMPLAINT:  Dyspnea   History of Present Illness:  Jasmine [name] is a 77 y.o. female who has a PMH as below including but not limited to severe COPD (Followed by Dr. Elsworth Soho, last seen by pulmonary 12/05/21 by Rexene Edison. PFT's from 2014 FEV1 at 0.47 L( 30%), ratio 39 , No BD response (<200cc)). She presented to [facility] ED 7/9 with dyspnea and progressive intolerance to activities. States she has dyspnea at baseline and wears 3L O2. With exertion, she increased O2 to 4L and generally does OK and is able to get around slowly; however, recently, she has struggled to walk more then a few feet without becoming dyspneic and needing to rest. She continues to use Trelegy and DuoNebs at home. She was previously on Prednisone '10mg'$  daily per our office notes (from [facility] 12/05/21); however, she tells me that she has not taken this in "a while" after she was supposedly told to stop. She also used to use Trilogy overnight; however, due to a recall, she stopped taking this in 2022 (was also documented as such at Feb office visit).   In ED, ABG demonstrated chronic hypoxic and hypercapnic respiratory failure (7.28/105/49). Her chemistry showed a CO2 of 42. She was awake and able to answer all questions. CTA chest was negative for PE but showed emphysema as well as known stable pulmonary nodules. She wore BiPAP overnight in ED and states "I slept great".  PCCM called in consultation 7/10. She denies any recent fevers but feels she has had some chills. Does report PND and is questioning whether it's related to allergies. Denies chest pain, exposures to known sick contacts, recent travel, recent smoking, exposures to 2nd hand smoke. She does have cough that is worse when she has PND. She is interested in speaking with palliative care regarding her options given her recent  decline in ADL's and her symptom burden.  Pertinent  Medical History:  has Depression with anxiety; Essential hypertension; ALLERGIC RHINITIS; Asthma; OSTEOPOROSIS; SINUSITIS, CHRONIC; Fatigue; Palliative care encounter; Macrocytosis without anemia; COPD (chronic obstructive pulmonary disease) ([place]); Cor pulmonale ([place]); Protein-calorie malnutrition, severe ([place]); Hypothyroidism following radioiodine therapy; Acute on chronic respiratory failure ([place]); Former smoker; Bacteremia [place] vir[place]; Acute on chronic respiratory failure with hypoxia ([place]); On home oxygen therapy; Chronic diastolic congestive heart failure ([place]); Solitary pulmonary nodule; Abnormal LFTs; Hyperglycemia; Bilateral hearing loss; Rash; Aspiration into airway; Polysubstance abuse ([place]); Hypercarbia; Respiratory failure with hypercapnia ([place]); COPD with acute exacerbation ([place]); Hypothyroidism; Anxiety; Chronic low back pain; Palliative care by specialist; Shortness of breath; Edema due to congestive heart failure ([place]); Goals of care, counseling/discussion; Respiratory acidosis; Malnutrition of moderate degree; Encounter for well adult exam with abnormal findings; Diabetes mellitus due to underlying condition without complication, without long-term current use of insulin ([place]); Steroid-dependent chronic obstructive pulmonary disease ([place]); Postablative hypothyroidism; COPD exacerbation ([place]); Physical deconditioning; and Vitamin D deficiency on their problem list.  Significant Hospital Events: Including procedures, antibiotic start and stop dates in addition to other pertinent events   7/9 admit. 7/10 PCCM consult.  Interim History / Subjective:  Talking on phone, comfortable. Feels "OK".  Objective:  Blood pressure 101/80, pulse (!) 103, temperature 98.7 F (37.1 C), resp. rate (!) 21, height 5' (1.524 m), weight 52.5 kg, SpO2 91 %.    FiO2 (%):  [50 %] 50 %  No intake  or output data in the 24 hours ending 04/28/22 1057 Filed  Weights   04/27/22 1759  Weight: 52.5 kg    Examination: General: Adult elderly female, chronically ill appearing, resting in stretcher talking on phone, in NAD. Neuro: A&O x 3, no deficits. HEENT: [place]/AT. Sclerae anicteric. EOMI. Cardiovascular: RRR, no M/R/G.  Lungs: Respirations even and unlabored.  Faint expiratory wheeze. Abdomen: BS x 4, soft, NT/ND.  Musculoskeletal: No gross deformities, no edema.  Skin: Intact, warm, no rashes.  Labs/imaging personally reviewed:  CTA chest 7/9 > no PE. Emphysema. Mild atx vs infiltrate at bases. Stable pulmonary nodules. Aortic atherosclerosis.  Assessment & Plan:   Acute on chronic hypoxic and hypercapnic respiratory failure - 2/2 severe COPD. AECOPD. Former smoker. - Continue supplemental O2 as needed to maintain SpO2 > 88%. - Continue BiPAP PRN and QHS. - She should qualify for new BiPAP at home given her chronic hypercapnia. - Continue empiric CTX. - Follow cultures. - Continue BD's, steroids. - Would wean down to '10mg'$  Prednisone daily and maintain as this was what our last pulmonary notes state that she was on. - Bronchial hygiene. - F/u as outpatient in our office. - Continue tobacco cessation.  Post nasal drip - ? Allergy component. - Start Flonase, daily antihistamine.  Recent decline in ADL's and generalized weakness with FTT - guessing sequela of ongoing COPD etc. - PT eval. - Palliative care consult, she is interested in hearing what her options are.  Pulmonary nodules - stable. - F/u as outpatient.  Best practice (evaluated daily):  Per primary team.  Labs   CBC: Recent Labs  Lab 04/27/22 1642 04/28/22 0500  WBC 6.4 4.2  NEUTROABS 3.9  --   HGB 13.3 12.3  HCT 44.8 41.7  MCV 103.2* 104.5*  PLT 149* 148*    Basic Metabolic Panel: Recent Labs  Lab 04/27/22 1642  NA 142  K 4.0  CL 91*  CO2 42*  GLUCOSE 103*  BUN 14  CREATININE 0.68  CALCIUM 9.7   GFR: Estimated Creatinine Clearance: 42.3  mL/min (by C-G formula based on SCr of 0.68 mg/dL). Recent Labs  Lab 04/27/22 1642 04/28/22 0500  WBC 6.4 4.2    Liver Function Tests: Recent Labs  Lab 04/27/22 1642  AST 34  ALT 22  ALKPHOS 57  BILITOT 1.0  PROT 7.9  ALBUMIN 4.2   No results for input(s): "LIPASE", "AMYLASE" in the last 168 hours. No results for input(s): "AMMONIA" in the last 168 hours.  ABG    Component Value Date/Time   PHART 7.276 (L) 03/01/2020 1532   PCO2ART 110 (HH) 03/01/2020 1532   PO2ART 73.5 (L) 03/01/2020 1532   HCO3 49.3 (H) 04/27/2022 1746   TCO2 48 (H) 03/16/2020 0001   O2SAT 79.1 04/27/2022 1746     Coagulation Profile: No results for input(s): "INR", "PROTIME" in the last 168 hours.  Cardiac Enzymes: No results for input(s): "CKTOTAL", "CKMB", "CKMBINDEX", "TROPONINI" in the last 168 hours.  HbA1C: Hemoglobin A1C  Date/Time Value Ref Range Status  02/25/2022 03:32 PM 6.5 (A) 4.0 - 5.6 % Final   Hgb A1c MFr Bld  Date/Time Value Ref Range Status  04/19/2021 03:51 PM 6.3 4.6 - 6.5 % Final    Comment:    Glycemic Control Guidelines for People with Diabetes:Non Diabetic:  <6%Goal of Therapy: <7%Additional Action Suggested:  >8%   06/08/2020 03:55 PM 6.6 (H) <5.7 % of total Hgb Final    Comment:    For someone without  known diabetes, a hemoglobin A1c value of 6.5% or greater indicates that they may have  diabetes and this should be confirmed with a follow-up  test. . For someone with known diabetes, a value <7% indicates  that their diabetes is well controlled and a value  greater than or equal to 7% indicates suboptimal  control. A1c targets should be individualized based on  duration of diabetes, age, comorbid conditions, and  other considerations. . Currently, no consensus exists regarding use of hemoglobin A1c for diagnosis of diabetes for children. .     CBG: Recent Labs  Lab 04/28/22 0828  GLUCAP 161*    Review of Systems:   All negative; except for those  that are bolded, which indicate positives.  Constitutional: weight loss, weight gain, night sweats, fevers, chills, fatigue, weakness.  HEENT: headaches, sore throat, sneezing, nasal congestion, post nasal drip, difficulty swallowing, tooth/dental problems, visual complaints, visual changes, ear aches. Neuro: difficulty with speech, weakness, numbness, ataxia. CV:  chest pain, orthopnea, PND, swelling in lower extremities, dizziness, palpitations, syncope.  Resp: cough, hemoptysis, dyspnea, wheezing. GI: heartburn, indigestion, abdominal pain, nausea, vomiting, diarrhea, constipation, change in bowel habits, loss of appetite, hematemesis, melena, hematochezia.  GU: dysuria, change in color of urine, urgency or frequency, flank pain, hematuria. MSK: joint pain or swelling, decreased range of motion. Psych: change in mood or affect, depression, anxiety, suicidal ideations, homicidal ideations. Skin: rash, itching, bruising.   Past Medical History:  She,  has a past medical history of ALLERGIC RHINITIS (05/01/2007), Anxiety, Arthritis, ASTHMA (05/01/2007), Cancer ([place]), CHF (congestive heart failure) ([place]), Chronic bronchitis ([place]), COPD (chronic obstructive pulmonary disease) ([place]), DEPRESSION (07/21/2008), DVT (deep venous thrombosis) ([place]) (1960s), FATIGUE (10/10/2010), HYPERTENSION (05/01/2007), HYPOTHYROIDISM (08/23/2009), LUMBAR RADICULOPATHY, RIGHT (08/25/2008), On home oxygen therapy, OSTEOPOROSIS (05/01/2007), SHOULDER PAIN, LEFT (08/23/2009), and SINUSITIS, CHRONIC (10/10/2010).   Surgical History:   Past Surgical History:  Procedure Laterality Date   APPENDECTOMY     DILATION AND CURETTAGE OF UTERUS  multiple   history of multiple dialations and curettages and miscarriages, unfortunately never carrying a child to term   ECTOPIC PREGNANCY SURGERY  "late '60s or early '70s   ELECTROCARDIOGRAM  06/20/2006   OOPHORECTOMY Right      Social History:   reports that she quit smoking about 5  years ago. Her smoking use included cigarettes. She started smoking about 58 years ago. She has a 52.00 pack-year smoking history. She has never used smokeless tobacco. She reports that she does not currently use alcohol. She reports that she does not currently use drugs.   Family History:  Her family history includes Alcohol abuse in her brother; Asthma in an other family member; Diabetes in her brother; Hypertension in an other family member; Lung cancer in her father; Stroke in her mother; Thyroid disease in her mother. There is no history of Breast cancer.   Allergies Allergies  Allergen Reactions   Alendronate Sodium Other (See Comments)    Patient does not remember this reaction     Home Medications  Prior to Admission medications   Medication Sig Start Date End Date Taking? Authorizing Provider  albuterol (VENTOLIN HFA) 108 (90 Base) MCG/ACT inhaler INHALE 1 TO 2 PUFFS INTO THE LUNGS EVERY 6 HOURS AS NEEDED FOR WHEEZING OR SHORTNESS OF BREATH 04/14/22  Yes Biagio Borg, MD  aspirin EC 81 MG EC tablet Take 1 tablet (81 mg total) by mouth daily. 03/27/16  Yes Velvet Bathe, MD  atorvastatin (LIPITOR) 10 MG tablet TAKE 1  TABLET BY MOUTH DAILY. Patient taking differently: Take 10 mg by mouth daily. 07/12/21  Yes Biagio Borg, MD  citalopram (CELEXA) 20 MG tablet TAKE ONE TABLET BY MOUTH DAILY Patient taking differently: Take 20 mg by mouth daily. TAKE ONE TABLET BY MOUTH DAILY 04/19/21  Yes Biagio Borg, MD  Fluticasone-Umeclidin-Vilant (TRELEGY ELLIPTA) 200-62.5-25 MCG/INH AEPB Inhale 1 puff into the lungs daily. 04/19/21  Yes Biagio Borg, MD  furosemide (LASIX) 40 MG tablet 1 tab by mouth twice per day Patient taking differently: Take 40 mg by mouth 2 (two) times daily. 04/19/21  Yes Biagio Borg, MD  guaiFENesin (ROBITUSSIN) 100 MG/5ML liquid Take 5 mLs by mouth every 4 (four) hours as needed for cough or to loosen phlegm. Mucinex chest congestion (liquid)   Yes [provider]   ipratropium-albuterol (DUONEB) 0.5-2.5 (3) MG/3ML SOLN Take 3 mLs by nebulization every 6 (six) hours as needed. Patient taking differently: Take 3 mLs by nebulization every 6 (six) hours as needed (for shortness of breath). 11/05/21  Yes Rigoberto Noel, MD  levothyroxine (SYNTHROID) 75 MCG tablet Take 1 tablet (75 mcg total) by mouth daily before breakfast. TAKE ONE TABLET BY MOUTH EARLY MORNING 02/26/22  Yes Shamleffer, Melanie Crazier, MD  OXYGEN Inhale 3 L into the lungs continuous. 24/7    [provider]  potassium chloride (KLOR-CON) 10 MEQ tablet TAKE 1 TABLET BY MOUTH 2 TIMES DAILY. Patient not taking: Reported on 04/28/2022 04/19/21   Biagio Borg, MD      Montey Hora, [facility] For pager details, please see AMION or use Epic chat  After 1900, please call [facility] for cross coverage needs 04/28/2022, 10:57 AM

## 2022-04-28 NOTE — Assessment & Plan Note (Signed)
-   Check TSH continue home medications at current dose ° °

## 2022-04-28 NOTE — Assessment & Plan Note (Signed)
Chronic will need to follow up with pulmonology

## 2022-04-28 NOTE — Evaluation (Signed)
Physical Therapy Evaluation Patient Details Name: Jasmine [name] MRN: [id number] DOB: 1946/01/09 Today's Date: 04/28/2022  History of Present Illness  Pt is a 76 yo female admitted with COPD with acute exacerbation. PMH: end-stage COPD on 4 L at home, DM2, osteoporosis, hypothyroidism  Clinical Impression  Pt admitted with above diagnosis. Pt from home with spouse, using rollator and taking seated rest break with self care tasks and household chores as needed, on 4L O2 at baseline. Pt currently mobilizing to EOB with supv, light use of bedrails to assist, fatigues easily, desats to 82% on 4L requiring return to supine and recovers to 89% with ~3 minutes of pursed lip breathing and rest- RN notified; further mobility deferred due to quick desaturation. Pt currently with functional limitations due to the deficits listed below (see PT Problem List). Pt will benefit from skilled PT to increase their independence and safety with mobility to allow discharge to the venue listed below.          Recommendations for follow up therapy are one component of a multi-disciplinary discharge planning process, led by the attending physician.  Recommendations may be updated based on patient status, additional functional criteria and insurance authorization.  Follow Up Recommendations Home health PT      Assistance Recommended at Discharge Intermittent Supervision/Assistance  Patient can return home with the following  A little help with walking and/or transfers;A little help with bathing/dressing/bathroom;Assistance with cooking/housework;Assist for transportation    Equipment Recommendations None recommended by PT  Recommendations for Other Services       Functional Status Assessment Patient has had a recent decline in their functional status and demonstrates the ability to make significant improvements in function in a reasonable and predictable amount of time.     Precautions / Restrictions  Precautions Precautions: Fall Precaution Comments: desats quickly Restrictions Weight Bearing Restrictions: No      Mobility  Bed Mobility Overal bed mobility: Needs Assistance Bed Mobility: Supine to Sit, Sit to Supine  Supine to sit: Supervision Sit to supine: Supervision  General bed mobility comments: slow, increased time, light use of bedrail, dyspnea 3/4 with mobility    Transfers  General transfer comment: not attempted, pt desats on 4L to 82% requiring return to supine with pursed lip breathing to recover    Ambulation/Gait  General Gait Details: not attempted, pt desats on 4L to 82% requiring return to supine with pursed lip breathing to recover  Stairs            Wheelchair Mobility    Modified Rankin (Stroke Patients Only)       Balance Overall balance assessment: Needs assistance   Sitting balance-Leahy Scale: Good       Pertinent Vitals/Pain Pain Assessment Pain Assessment: No/denies pain    Home Living Family/patient expects to be discharged to:: Private residence Living Arrangements: Spouse/significant other Available Help at Discharge: Family;Available 24 hours/day Type of Home: House Home Access: Ramped entrance       Home Layout: One level Home Equipment: Conservation officer, nature (2 wheels);Rollator (4 wheels);Shower seat (has grab bars, but need to be installed) Additional Comments: on 4L O2 at baseline    Prior Function Prior Level of Function : Independent/Modified Independent  Mobility Comments: using rollator in the home and in community ADLs Comments: pt taking seated rest breaks to complete self care tasks and light housework, spouse assisting with housework     Hand Dominance   Dominant Hand: Right    Extremity/Trunk Assessment   Upper  Extremity Assessment Upper Extremity Assessment: Defer to OT evaluation    Lower Extremity Assessment Lower Extremity Assessment: Generalized weakness (AROM WNL, strength grossly 3+/5, denies  numbness/tingling throughout)    Cervical / Trunk Assessment Cervical / Trunk Assessment: Kyphotic  Communication   Communication: No difficulties  Cognition Arousal/Alertness: Awake/alert Behavior During Therapy: WFL for tasks assessed/performed Overall Cognitive Status: Within Functional Limits for tasks assessed     General Comments General comments (skin integrity, edema, etc.): pt on 3L O2 upon arrival, SpO2 85%, increased to 4L with SpO2 88%, pt desats to 82% on 4L with supine to sit and dyspnea 3/4, returned to supine with pursed lip breathing and recovers to 89% after ~3 minutes    Exercises     Assessment/Plan    PT Assessment Patient needs continued PT services  PT Problem List Decreased activity tolerance;Decreased strength;Decreased knowledge of use of DME;Cardiopulmonary status limiting activity       PT Treatment Interventions DME instruction;Gait training;Functional mobility training;Therapeutic activities;Therapeutic exercise;Balance training;Patient/family education    PT Goals (Current goals can be found in the Care Plan section)  Acute Rehab PT Goals Patient Stated Goal: extra help at home PT Goal Formulation: With patient Time For Goal Achievement: 05/12/22 Potential to Achieve Goals: Good    Frequency Min 3X/week     Co-evaluation               AM-PAC PT "6 Clicks" Mobility  Outcome Measure Help needed turning from your back to your side while in a flat bed without using bedrails?: A Little Help needed moving from lying on your back to sitting on the side of a flat bed without using bedrails?: A Little Help needed moving to and from a bed to a chair (including a wheelchair)?: A Little Help needed standing up from a chair using your arms (e.g., wheelchair or bedside chair)?: A Little Help needed to walk in hospital room?: Total Help needed climbing 3-5 steps with a railing? : Total 6 Click Score: 14    End of Session Equipment Utilized  During Treatment: Oxygen Activity Tolerance: Patient tolerated treatment well;Patient limited by fatigue Patient left: in bed;with call bell/phone within reach;with nursing/sitter in room Nurse Communication: Mobility status;Other (comment) (SpO2) PT Visit Diagnosis: Other abnormalities of gait and mobility (R26.89)    Time: 7564-3329 PT Time Calculation (min) (ACUTE ONLY): 22 min   Charges:   PT Evaluation $PT Eval Moderate Complexity: 1 Mod           Tori Siyona Coto PT, DPT 04/28/22, 4:07 PM

## 2022-04-28 NOTE — Assessment & Plan Note (Signed)
Continue lasix 40 mg po BID

## 2022-04-28 NOTE — Progress Notes (Signed)
RT took the bipap off at 0830 and placed her on 2l. The Pt wears 4l at home but her sats were good on 2l. RT explained to the Pt if she gets up we can turn it to 4l while ambulating. Rt will continue to monitor

## 2022-04-29 ENCOUNTER — Other Ambulatory Visit: Payer: Medicare HMO

## 2022-04-29 ENCOUNTER — Other Ambulatory Visit: Payer: Medicare HMO | Admitting: Obstetrics and Gynecology

## 2022-04-29 ENCOUNTER — Telehealth: Payer: Self-pay | Admitting: Physician Assistant

## 2022-04-29 ENCOUNTER — Inpatient Hospital Stay (HOSPITAL_COMMUNITY): Payer: Medicare HMO

## 2022-04-29 DIAGNOSIS — Z7189 Other specified counseling: Secondary | ICD-10-CM | POA: Diagnosis not present

## 2022-04-29 DIAGNOSIS — R0609 Other forms of dyspnea: Secondary | ICD-10-CM

## 2022-04-29 DIAGNOSIS — J441 Chronic obstructive pulmonary disease with (acute) exacerbation: Secondary | ICD-10-CM | POA: Diagnosis not present

## 2022-04-29 DIAGNOSIS — Z515 Encounter for palliative care: Secondary | ICD-10-CM | POA: Diagnosis not present

## 2022-04-29 LAB — BLOOD GAS, ARTERIAL
Acid-Base Excess: 24.5 mmol/L — ABNORMAL HIGH (ref 0.0–2.0)
Bicarbonate: 53.1 mmol/L — ABNORMAL HIGH (ref 20.0–28.0)
O2 Content: 4 L/min
O2 Saturation: 92 %
Patient temperature: 37
pCO2 arterial: 73 mmHg (ref 32–48)
pH, Arterial: 7.47 — ABNORMAL HIGH (ref 7.35–7.45)
pO2, Arterial: 59 mmHg — ABNORMAL LOW (ref 83–108)

## 2022-04-29 LAB — ECHOCARDIOGRAM COMPLETE
AR max vel: 2.02 cm2
AV Area VTI: 2.13 cm2
AV Area mean vel: 1.98 cm2
AV Mean grad: 8 mmHg
AV Peak grad: 14.3 mmHg
Ao pk vel: 1.89 m/s
Area-P 1/2: 5.54 cm2
Height: 60 in
S' Lateral: 2 cm
Weight: 1852.8 oz

## 2022-04-29 LAB — CBC
HCT: 37.8 % (ref 36.0–46.0)
Hemoglobin: 11.5 g/dL — ABNORMAL LOW (ref 12.0–15.0)
MCH: 30.3 pg (ref 26.0–34.0)
MCHC: 30.4 g/dL (ref 30.0–36.0)
MCV: 99.7 fL (ref 80.0–100.0)
Platelets: 152 10*3/uL (ref 150–400)
RBC: 3.79 MIL/uL — ABNORMAL LOW (ref 3.87–5.11)
RDW: 13.6 % (ref 11.5–15.5)
WBC: 6.8 10*3/uL (ref 4.0–10.5)
nRBC: 0 % (ref 0.0–0.2)

## 2022-04-29 LAB — COMPREHENSIVE METABOLIC PANEL
ALT: 17 U/L (ref 0–44)
AST: 25 U/L (ref 15–41)
Albumin: 3.4 g/dL — ABNORMAL LOW (ref 3.5–5.0)
Alkaline Phosphatase: 46 U/L (ref 38–126)
Anion gap: 10 (ref 5–15)
BUN: 21 mg/dL (ref 8–23)
CO2: 45 mmol/L — ABNORMAL HIGH (ref 22–32)
Calcium: 9.5 mg/dL (ref 8.9–10.3)
Chloride: 88 mmol/L — ABNORMAL LOW (ref 98–111)
Creatinine, Ser: 0.72 mg/dL (ref 0.44–1.00)
GFR, Estimated: >60 mL/min (ref >60)
Glucose, Bld: 152 mg/dL — ABNORMAL HIGH (ref 70–99)
Potassium: 4.4 mmol/L (ref 3.5–5.1)
Sodium: 143 mmol/L (ref 135–145)
Total Bilirubin: 0.7 mg/dL (ref 0.3–1.2)
Total Protein: 6.5 g/dL (ref 6.5–8.1)

## 2022-04-29 LAB — GLUCOSE, CAPILLARY
Glucose-Capillary: 123 mg/dL — ABNORMAL HIGH (ref 70–99)
Glucose-Capillary: 134 mg/dL — ABNORMAL HIGH (ref 70–99)
Glucose-Capillary: 204 mg/dL — ABNORMAL HIGH (ref 70–99)
Glucose-Capillary: 75 mg/dL (ref 70–99)

## 2022-04-29 LAB — MAGNESIUM: Magnesium: 1.6 mg/dL — ABNORMAL LOW (ref 1.7–2.4)

## 2022-04-29 MED ORDER — CEFDINIR 300 MG PO CAPS
300.0000 mg | ORAL_CAPSULE | Freq: Two times a day (BID) | ORAL | Status: DC
  Administered 2022-04-29 – 2022-05-01 (×5): 300 mg via ORAL
  Filled 2022-04-29 (×5): qty 1

## 2022-04-29 MED ORDER — IPRATROPIUM-ALBUTEROL 0.5-2.5 (3) MG/3ML IN SOLN
3.0000 mL | Freq: Three times a day (TID) | RESPIRATORY_TRACT | Status: DC
  Administered 2022-04-29 – 2022-05-01 (×7): 3 mL via RESPIRATORY_TRACT
  Filled 2022-04-29 (×7): qty 3

## 2022-04-29 MED ORDER — CEFDINIR 300 MG PO CAPS
300.0000 mg | ORAL_CAPSULE | Freq: Two times a day (BID) | ORAL | Status: DC
Start: 2022-04-29 — End: 2022-04-29

## 2022-04-29 MED ORDER — ENSURE ENLIVE PO LIQD
237.0000 mL | Freq: Two times a day (BID) | ORAL | Status: DC
  Administered 2022-04-29 – 2022-05-01 (×4): 237 mL via ORAL

## 2022-04-29 MED ORDER — MAGNESIUM SULFATE 2 GM/50ML IV SOLN
2.0000 g | Freq: Once | INTRAVENOUS | Status: AC
  Administered 2022-04-29: 2 g via INTRAVENOUS
  Filled 2022-04-29: qty 50

## 2022-04-29 NOTE — Progress Notes (Addendum)
PROGRESS NOTE  Jasmine [name] GGY:[id number] DOB: 12/10/1945 DOA: 04/27/2022 PCP: Biagio Borg, MD   LOS: 2 days   Brief Narrative / Interim history: 76 year old female with history of end-stage COPD on 4 L at home, DM2, osteoporosis, hypothyroidism, comes into the hospital with worsening shortness of breath.  This has been going on for the past few days, she ran out of her rescue inhaler, and felt progressively worse and called 911  Subjective / 24h Interval events: Overall feeling better.  Try to work with PT yesterday and was not able to walk but 3 steps then became extremely short of breath and had to go back to bed.  Has not been up  Assesement and Plan: Principal Problem:   COPD with acute exacerbation ([place]) Active Problems:   Essential hypertension   Cor pulmonale (HCC)   Acute on chronic respiratory failure with hypoxia (HCC)   Chronic diastolic congestive heart failure (HCC)   Solitary pulmonary nodule   Hypothyroidism   Diabetes mellitus due to underlying condition without complication, without long-term current use of insulin (HCC)  Principal problem Acute on chronic hypercarbic and hypoxic respiratory failure due to COPD exacerbation-continue supportive care with IV steroids, nebulizers, antibiotics.  CT angiogram on admission showed mild infiltrates at the lung bases versus atelectasis.  Continue incentive spirometry, flutter valve, closely monitor.  COVID, RVP negative.  Continue supportive treatment.  Ambulate.  Incentive spirometry, flutter valve -Patient has chronic hypercarbia and used to have a BiPAP at home up until November of last year, and that was recalled.  Not clear why, but has not had another one since.  Case manager involved.  Active problems Chronic diastolic CHF-continue Lasix.  Echo shows EF 03-50%, grade 1 diastolic dysfunction.  RV was normal.  Approaching euvolemia, probably can stop Lasix tomorrow  DM2-diet controlled, given sliding scale  CBG  (last 3)  Recent Labs    04/28/22 2031 04/29/22 0721 04/29/22 1216  GLUCAP 142* 123* 134*    Lab Results  Component Value Date   HGBA1C 6.5 (A) 02/25/2022   Hyperlipidemia-continue statin  Essential hypertension-continue furosemide  Hypothyroidism-continue Synthroid  Solitary pulmonary nodule-Chronic will need to follow up with pulmonology    Scheduled Meds:  arformoterol  15 mcg Nebulization BID   aspirin EC  81 mg Oral Daily   atorvastatin  10 mg Oral Daily   budesonide (PULMICORT) nebulizer solution  0.5 mg Nebulization BID   cefdinir  300 mg Oral Q12H   citalopram  20 mg Oral Daily   enoxaparin (LOVENOX) injection  40 mg Subcutaneous Q24H   fluticasone  1 spray Each Nare Daily   furosemide  40 mg Oral BID   guaiFENesin  600 mg Oral BID   insulin aspart  0-9 Units Subcutaneous TID WC   ipratropium-albuterol  3 mL Nebulization TID   levothyroxine  75 mcg Oral QAC breakfast   loratadine  10 mg Oral Daily   predniSONE  40 mg Oral Q breakfast   sodium chloride flush  3 mL Intravenous Q12H   Continuous Infusions:  sodium chloride     PRN Meds:.sodium chloride, acetaminophen **OR** acetaminophen, sodium chloride flush  Diet Orders (From admission, onward)     Start     Ordered   04/28/22 0650  Diet regular Room service appropriate? Yes; Fluid consistency: Thin  Diet effective now       Question Answer Comment  Room service appropriate? Yes   Fluid consistency: Thin      04/28/22  0649            DVT prophylaxis: enoxaparin (LOVENOX) injection 40 mg Start: 04/28/22 1000   Lab Results  Component Value Date   PLT 152 04/29/2022      Code Status: Full Code  Family Communication: no family at bedside   Status is: Inpatient Remains inpatient appropriate because: Significant shortness of breath with minimal activities   Level of care: Progressive  Consultants:  PCCM  Objective: Vitals:   04/29/22 0734 04/29/22 0800 04/29/22 0821 04/29/22 0825   BP:      Pulse:      Resp:      Temp:      TempSrc:      SpO2: 95% 97% 95% 95%  Weight:      Height:        Intake/Output Summary (Last 24 hours) at 04/29/2022 1301 Last data filed at 04/29/2022 0300 Gross per 24 hour  Intake 340 ml  Output 400 ml  Net -60 ml   Wt Readings from Last 3 Encounters:  04/27/22 52.5 kg  03/05/22 52.2 kg  02/25/22 53 kg    Examination:  Constitutional: NAD Eyes: lids and conjunctivae normal, no scleral icterus ENMT: mmm Neck: normal, supple Respiratory: Distant breath sounds.  No wheezing. Cardiovascular: Regular rate and rhythm, no murmurs / rubs / gallops.  1+ edema Abdomen: soft, no distention, no tenderness. Bowel sounds positive.  Skin: no rashes Neurologic: no focal deficits, equal strength   Data Reviewed: I have independently reviewed following labs and imaging studies   CBC Recent Labs  Lab 04/27/22 1642 04/28/22 0500 04/29/22 0420  WBC 6.4 4.2 6.8  HGB 13.3 12.3 11.5*  HCT 44.8 41.7 37.8  PLT 149* 148* 152  MCV 103.2* 104.5* 99.7  MCH 30.6 30.8 30.3  MCHC 29.7* 29.5* 30.4  RDW 13.6 13.7 13.6  LYMPHSABS 1.5  --   --   MONOABS 0.7  --   --   EOSABS 0.3  --   --   BASOSABS 0.0  --   --      Recent Labs  Lab 04/27/22 1642 04/28/22 1355 04/28/22 1356 04/29/22 0420  NA 142  --  137 143  K 4.0  --  4.2 4.4  CL 91*  --  89* 88*  CO2 42*  --  39* 45*  GLUCOSE 103*  --  120* 152*  BUN 14  --  16 21  CREATININE 0.68  --  0.62 0.72  CALCIUM 9.7  --  9.4 9.5  AST 34  --  27 25  ALT 22  --  20 17  ALKPHOS 57  --  52 46  BILITOT 1.0  --  0.7 0.7  ALBUMIN 4.2  --  3.7 3.4*  MG  --  1.6*  --  1.6*  TSH  --   --  0.052*  --   BNP 16.6  --   --   --      ------------------------------------------------------------------------------------------------------------------ No results for input(s): "CHOL", "HDL", "LDLCALC", "TRIG", "CHOLHDL", "LDLDIRECT" in the last 72 hours.  Lab Results  Component Value Date    HGBA1C 6.5 (A) 02/25/2022   ------------------------------------------------------------------------------------------------------------------ Recent Labs    04/28/22 1356  TSH 0.052*    Cardiac Enzymes No results for input(s): "CKMB", "TROPONINI", "MYOGLOBIN" in the last 168 hours.  Invalid input(s): "CK" ------------------------------------------------------------------------------------------------------------------    Component Value Date/Time   BNP 16.6 04/27/2022 1642    CBG: Recent Labs  Lab 04/28/22 1201 04/28/22  1603 04/28/22 2031 04/29/22 0721 04/29/22 1216  GLUCAP 193* 152* 142* 123* 134*     Recent Results (from the past 240 hour(s))  SARS Coronavirus 2 by RT PCR (hospital order, performed in [facility] hospital lab) *cepheid single result test* Anterior Nasal Swab     Status: None   Collection Time: 04/28/22 12:35 AM   Specimen: Anterior Nasal Swab  Result Value Ref Range Status   SARS Coronavirus 2 by RT PCR NEGATIVE NEGATIVE Final    Comment: (NOTE) SARS-CoV-2 target nucleic acids are NOT DETECTED.  The SARS-CoV-2 RNA is generally detectable in upper and lower respiratory specimens during the acute phase of infection. The lowest concentration of SARS-CoV-2 viral copies this assay can detect is 250 copies / mL. A negative result does not preclude SARS-CoV-2 infection and should not be used as the sole basis for treatment or other patient management decisions.  A negative result may occur with improper specimen collection / handling, submission of specimen other than nasopharyngeal swab, presence of viral mutation(s) within the areas targeted by this assay, and inadequate number of viral copies (<250 copies / mL). A negative result must be combined with clinical observations, patient history, and epidemiological information.  Fact Sheet for Patients:   https://www.patel.info/  Fact Sheet for Healthcare  Providers: https://hall.com/  This test is not yet approved or  cleared by the [place] FDA and has been authorized for detection and/or diagnosis of SARS-CoV-2 by FDA under an Emergency Use Authorization (EUA).  This EUA will remain in effect (meaning this test can be used) for the duration of the COVID-19 declaration under Section 564(b)(1) of the Act, 21 U.S.C. section 360bbb-3(b)(1), unless the authorization is terminated or revoked sooner.  Performed at [facility], [place] [address], [place], [place] [address]   Respiratory (~20 pathogens) panel by PCR     Status: None   Collection Time: 04/28/22  4:36 AM   Specimen: Nasopharyngeal Swab; Respiratory  Result Value Ref Range Status   Adenovirus NOT DETECTED NOT DETECTED Final   Coronavirus 229E NOT DETECTED NOT DETECTED Final    Comment: (NOTE) The Coronavirus on the Respiratory Panel, DOES NOT test for the novel  Coronavirus (2019 nCoV)    Coronavirus HKU1 NOT DETECTED NOT DETECTED Final   Coronavirus NL63 NOT DETECTED NOT DETECTED Final   Coronavirus OC43 NOT DETECTED NOT DETECTED Final   Metapneumovirus NOT DETECTED NOT DETECTED Final   Rhinovirus / Enterovirus NOT DETECTED NOT DETECTED Final   Influenza A NOT DETECTED NOT DETECTED Final   Influenza B NOT DETECTED NOT DETECTED Final   Parainfluenza Virus 1 NOT DETECTED NOT DETECTED Final   Parainfluenza Virus 2 NOT DETECTED NOT DETECTED Final   Parainfluenza Virus 3 NOT DETECTED NOT DETECTED Final   Parainfluenza Virus 4 NOT DETECTED NOT DETECTED Final   Respiratory Syncytial Virus NOT DETECTED NOT DETECTED Final   Bordetella pertussis NOT DETECTED NOT DETECTED Final   Bordetella Parapertussis NOT DETECTED NOT DETECTED Final   Chlamydophila pneumoniae NOT DETECTED NOT DETECTED Final   Mycoplasma pneumoniae NOT DETECTED NOT DETECTED Final    Comment: Performed at [facility], [place]. [address] [place], [place] [address]   MRSA Next Gen by PCR, Nasal     Status: None   Collection Time: 04/28/22  4:36 AM   Specimen: Nasopharyngeal Swab; Nasal Swab  Result Value Ref Range Status   MRSA by PCR Next Gen NOT DETECTED NOT DETECTED Final    Comment: (NOTE)  The GeneXpert MRSA Assay (FDA approved for NASAL specimens only), is one component of a comprehensive MRSA colonization surveillance program. It is not intended to diagnose MRSA infection nor to guide or monitor treatment for MRSA infections. Test performance is not FDA approved in patients less than 35 years old. Performed at [facility], [place] [address], [place], [place] [address]      Radiology Studies: ECHOCARDIOGRAM COMPLETE  Result Date: 04/29/2022    ECHOCARDIOGRAM REPORT   Patient Name:   JAIMI BELLE Date of Exam: 04/29/2022 Medical Rec #:  [id number]       Height:       60.0 in Accession #:    [id number]      Weight:       115.8 lb Date of Birth:  02-04-1946       BSA:          1.480 m Patient Age:    83 years        BP:           121/62 mmHg Patient Gender: F               HR:           90 bpm. Exam Location:  Inpatient Procedure: 2D Echo, Cardiac Doppler and Color Doppler Indications:    Dyspnea  History:        Patient has prior history of Echocardiogram examinations, most                 recent 07/31/2021. CHF, COPD, Signs/Symptoms:Shortness of                 Breath; Risk Factors:Hypertension.  Sonographer:    Joette Catching RCS Referring Phys: [place]  1. Left ventricular ejection fraction, by estimation, is 70 to 75%. The left ventricle has hyperdynamic function. The left ventricle has no regional wall motion abnormalities. Left ventricular diastolic parameters are consistent with Grade I diastolic dysfunction (impaired relaxation).  2. Right ventricular systolic function is normal. The right ventricular size is normal.  3. The mitral valve is normal in structure. No evidence of mitral valve  regurgitation. No evidence of mitral stenosis.  4. The aortic valve is normal in structure. Aortic valve regurgitation is not visualized. No aortic stenosis is present.  5. The inferior vena cava is normal in size with <50% respiratory variability, suggesting right atrial pressure of 8 mmHg. Comparison(s): No significant change from prior study. Prior images reviewed side by side. FINDINGS  Left Ventricle: Left ventricular ejection fraction, by estimation, is 70 to 75%. The left ventricle has hyperdynamic function. The left ventricle has no regional wall motion abnormalities. The left ventricular internal cavity size was normal in size. There is no left ventricular hypertrophy. Left ventricular diastolic parameters are consistent with Grade I diastolic dysfunction (impaired relaxation). Indeterminate filling pressures. Right Ventricle: The right ventricular size is normal. No increase in right ventricular wall thickness. Right ventricular systolic function is normal. Left Atrium: Left atrial size was normal in size. Right Atrium: Right atrial size was normal in size. Pericardium: There is no evidence of pericardial effusion. Mitral Valve: The mitral valve is normal in structure. No evidence of mitral valve regurgitation. No evidence of mitral valve stenosis. Tricuspid Valve: The tricuspid valve is normal in structure. Tricuspid valve regurgitation is not demonstrated. No evidence of tricuspid stenosis. Aortic Valve: The aortic valve is normal in structure. Aortic valve regurgitation is not visualized. No aortic stenosis is  present. Aortic valve mean gradient measures 8.0 mmHg. Aortic valve peak gradient measures 14.3 mmHg. Aortic valve area, by VTI measures 2.13 cm. Pulmonic Valve: The pulmonic valve was normal in structure. Pulmonic valve regurgitation is not visualized. No evidence of pulmonic stenosis. Aorta: The aortic root is normal in size and structure. Venous: The inferior vena cava is normal in size with  less than 50% respiratory variability, suggesting right atrial pressure of 8 mmHg. IAS/Shunts: No atrial level shunt detected by color flow Doppler.  LEFT VENTRICLE PLAX 2D LVIDd:         3.20 cm   Diastology LVIDs:         2.00 cm   LV e' medial:    6.20 cm/s LV PW:         0.85 cm   LV E/e' medial:  15.7 LV IVS:        1.00 cm   LV e' lateral:   9.68 cm/s LVOT diam:     1.70 cm   LV E/e' lateral: 10.1 LV SV:         71 LV SV Index:   48 LVOT Area:     2.27 cm  RIGHT VENTRICLE             IVC RV Basal diam:  3.70 cm     IVC diam: 1.90 cm RV Mid diam:    2.00 cm RV S prime:     14.70 cm/s TAPSE (M-mode): 2.3 cm LEFT ATRIUM             Index        RIGHT ATRIUM           Index LA diam:        2.30 cm 1.55 cm/m   RA Area:     13.40 cm LA Vol (A2C):   20.0 ml 13.52 ml/m  RA Volume:   33.30 ml  22.50 ml/m LA Vol (A4C):   19.1 ml 12.91 ml/m LA Biplane Vol: 20.6 ml 13.92 ml/m  AORTIC VALVE                     PULMONIC VALVE AV Area (Vmax):    2.02 cm      PV Vmax:       1.33 m/s AV Area (Vmean):   1.98 cm      PV Peak grad:  7.1 mmHg AV Area (VTI):     2.13 cm AV Vmax:           189.00 cm/s AV Vmean:          132.000 cm/s AV VTI:            0.334 m AV Peak Grad:      14.3 mmHg AV Mean Grad:      8.0 mmHg LVOT Vmax:         168.00 cm/s LVOT Vmean:        115.000 cm/s LVOT VTI:          0.314 m LVOT/AV VTI ratio: 0.94  AORTA Ao Root diam: 2.90 cm Ao Asc diam:  2.40 cm MITRAL VALVE MV Area (PHT): 5.54 cm     SHUNTS MV Decel Time: 137 msec     Systemic VTI:  0.31 m MV E velocity: 97.50 cm/s   Systemic Diam: 1.70 cm MV A velocity: 108.00 cm/s MV E/A ratio:  0.90 Mihai Croitoru MD Electronically signed by Sanda Klein MD Signature Date/Time: 04/29/2022/12:47:46 PM  Final      Marzetta Board, MD, PhD [facility]  Between 7 am - 7 pm I am available, please contact me via Amion (for emergencies) or Securechat (non urgent messages)  Between 7 pm - 7 am I am not available, please contact night coverage  MD/APP via Amion

## 2022-04-29 NOTE — Consult Note (Signed)
Consultation Note Date: 04/29/2022   Patient Name: Jasmine [name]  DOB: 23-Oct-1945  MRN: [id number]  Age / Sex: 76 y.o., female  PCP: Biagio Borg, MD Referring Physician: Caren Griffins, MD  Reason for Consultation: Establishing goals of care  HPI/Patient Profile: 76 y.o. female    admitted on 04/27/2022    Clinical Assessment and Goals of Care: 76 year old lady who lives at home has past medical history significant for severe chronic obstructive pulmonary disease, follows with pulmonary in the outpatient setting.  Patient has dyspnea at baseline and wears 3 L oxygen.  She has had gradual progressive decline in functional status and gradual progressive increase in symptom burden.  Patient has been admitted to hospital medicine service for acute on chronic hypoxic and hypercapnic respiratory failure.  She has been seen by pulmonary colleagues in consultation, palliative medicine consultation also requested for ongoing goals of care discussions. Palliative consult request received.  Patient seen and examined.  Chart reviewed.  Note that the patient has had outpatient palliative consultation on December 05, 2021. [facility] palliative services has also tried to contact the patient at the home for ongoing palliative support. Patient is awake alert resting in bed.  I introduced myself and palliative care as follows: Palliative medicine is specialized medical care for people living with serious illness. It focuses on providing relief from the symptoms and stress of a serious illness. The goal is to improve quality of life for both the patient and the family. Goals of care: Broad aims of medical therapy in relation to the patient's values and preferences. Our aim is to provide medical care aimed at enabling patients to achieve the goals that matter most to them, given the circumstances of their particular medical  situation and their constraints.  Discussed with the patient about her current life limiting condition of end-stage chronic obstructive pulmonary disease.  Explained to her about her current symptoms and expected disease trajectory.  Discussed about differences between hospice and palliative support.  Discussed about what matters most to her at this stage.  Patient states that she would like to be at the home for as long as possible.  Discussed with her about how initially palliative and then subsequently hospice services can help her achieve this goal of staying at home.  Offered active listening and supportive presence and other therapeutic techniques.  All of her questions answered to the best of my ability.  We will reach out to [facility] palliative liaison as well.  NEXT OF KIN  Lives with ex spouse [name], has 2 nieces.   SUMMARY OF RECOMMENDATIONS    Full Code Home with palliative services.   Code Status/Advance Care Planning: Full code   Symptom Management:     Palliative Prophylaxis:  Delirium Protocol  Psycho-social/Spiritual:  Desire for further Chaplaincy support:yes Additional Recommendations: Education on Hospice  Prognosis:  < 12 months  Discharge Planning: Home with Palliative Services      Primary Diagnoses: Present on Admission:  COPD with acute exacerbation ([place])  Chronic diastolic congestive heart failure (HCC)  Acute on chronic respiratory failure with hypoxia (HCC)  Cor pulmonale (HCC)  Diabetes mellitus due to underlying condition without complication, without long-term current use of insulin (HCC)  Essential hypertension  Hypothyroidism  Solitary pulmonary nodule   I have reviewed the medical record, interviewed the patient and family, and examined the patient. The following aspects are pertinent.  Past Medical History:  Diagnosis Date   ALLERGIC RHINITIS 05/01/2007   Anxiety    Arthritis    "hands" (12/28/2015)   ASTHMA 05/01/2007   "since I  was a child"   Cancer ([place])    LUNG   CHF (congestive heart failure) (HCC)    Chronic bronchitis (HCC)    COPD (chronic obstructive pulmonary disease) ([place])    DEPRESSION 07/21/2008   DVT (deep venous thrombosis) ([place]) 1960s   LLE   FATIGUE 10/10/2010   HYPERTENSION 05/01/2007   HYPOTHYROIDISM 08/23/2009   s/p RAI   LUMBAR RADICULOPATHY, RIGHT 08/25/2008   On home oxygen therapy    "3-4L; qd; all the time" (12/28/2015)   OSTEOPOROSIS 05/01/2007   SHOULDER PAIN, LEFT 08/23/2009   SINUSITIS, CHRONIC 10/10/2010   Social History   Socioeconomic History   Marital status: Significant Other    Spouse name: Not on file   Number of children: 0   Years of education: Not on file   Highest education level: Not on file  Occupational History   Occupation: RETIRED - Facilities manager: [employer]  Tobacco Use   Smoking status: Former    Packs/day: 1.00    Years: 52.00    Total pack years: 52.00    Types: Cigarettes    Start date: 1965    Quit date: 07/20/2016    Years since quitting: 5.7   Smokeless tobacco: Never  Vaping Use   Vaping Use: Never used  Substance and Sexual Activity   Alcohol use: Not Currently   Drug use: Not Currently   Sexual activity: Not Currently    Partners: Male    Birth control/protection: Post-menopausal  Other Topics Concern   Not on file  Social History Narrative   Lives in [place], [place]. Lives with husband, no children, but husband has 3 kids. Ambulatory, without cane or walker. Smokes cigarettes, currently 1 ppd, smoked for >20-30 yrs. Drinks beer, ~2/day, occasionally liquor, but ? suspect minimizing. Occasional MJ and endorses crack cocaine occasionally   Social Determinants of Health   Financial Resource Strain: Low Risk  (01/27/2022)   Overall Financial Resource Strain (CARDIA)    Difficulty of Paying Living Expenses: Not hard at all  Food Insecurity: No Food Insecurity (01/27/2022)   Hunger Vital Sign    Worried About  Running Out of Food in the Last Year: Never true    Ran Out of Food in the Last Year: Never true  Recent Concern: Food Insecurity - Food Insecurity Present (11/22/2021)   Hunger Vital Sign    Worried About Running Out of Food in the Last Year: Sometimes true    Ran Out of Food in the Last Year: Sometimes true  Transportation Needs: No Transportation Needs (03/11/2022)   PRAPARE - Hydrologist (Medical): No    Lack of Transportation (Non-Medical): No  Physical Activity: Inactive (01/27/2022)   Exercise Vital Sign    Days of Exercise per Week: 0 days    Minutes of Exercise per Session: 0 min  Stress: No Stress Concern Present (01/27/2022)  [employer] - Occupational Stress Questionnaire    Feeling of Stress : Not at all  Social Connections: Moderately Isolated (01/27/2022)   Social Connection and Isolation Panel [NHANES]    Frequency of Communication with Friends and Family: Twice a week    Frequency of Social Gatherings with Friends and Family: Twice a week    Attends Religious Services: 1 to 4 times per year    Active Member of [employer] or Organizations: No    Attends Archivist Meetings: Never    Marital Status: Widowed   Family History  Problem Relation Age of Onset   Lung cancer Father    Alcohol abuse Brother    Diabetes Brother    Hypertension Other    Stroke Mother    Thyroid disease Mother    Asthma Other        maternal aunts   Breast cancer Neg Hx    Scheduled Meds:  arformoterol  15 mcg Nebulization BID   aspirin EC  81 mg Oral Daily   atorvastatin  10 mg Oral Daily   budesonide (PULMICORT) nebulizer solution  0.5 mg Nebulization BID   cefdinir  300 mg Oral Q12H   citalopram  20 mg Oral Daily   enoxaparin (LOVENOX) injection  40 mg Subcutaneous Q24H   fluticasone  1 spray Each Nare Daily   furosemide  40 mg Oral BID   guaiFENesin  600 mg Oral BID   insulin aspart  0-9 Units Subcutaneous TID WC    ipratropium-albuterol  3 mL Nebulization TID   levothyroxine  75 mcg Oral QAC breakfast   loratadine  10 mg Oral Daily   predniSONE  40 mg Oral Q breakfast   sodium chloride flush  3 mL Intravenous Q12H   Continuous Infusions:  sodium chloride     magnesium sulfate bolus IVPB     PRN Meds:.sodium chloride, acetaminophen **OR** acetaminophen, sodium chloride flush Medications Prior to Admission:  Prior to Admission medications   Medication Sig Start Date End Date Taking? Authorizing Provider  albuterol (VENTOLIN HFA) 108 (90 Base) MCG/ACT inhaler INHALE 1 TO 2 PUFFS INTO THE LUNGS EVERY 6 HOURS AS NEEDED FOR WHEEZING OR SHORTNESS OF BREATH 04/14/22  Yes Biagio Borg, MD  aspirin EC 81 MG EC tablet Take 1 tablet (81 mg total) by mouth daily. 03/27/16  Yes Velvet Bathe, MD  atorvastatin (LIPITOR) 10 MG tablet TAKE 1 TABLET BY MOUTH DAILY. Patient taking differently: Take 10 mg by mouth daily. 07/12/21  Yes Biagio Borg, MD  citalopram (CELEXA) 20 MG tablet TAKE ONE TABLET BY MOUTH DAILY Patient taking differently: Take 20 mg by mouth daily. TAKE ONE TABLET BY MOUTH DAILY 04/19/21  Yes Biagio Borg, MD  Fluticasone-Umeclidin-Vilant (TRELEGY ELLIPTA) 200-62.5-25 MCG/INH AEPB Inhale 1 puff into the lungs daily. 04/19/21  Yes Biagio Borg, MD  furosemide (LASIX) 40 MG tablet 1 tab by mouth twice per day Patient taking differently: Take 40 mg by mouth 2 (two) times daily. 04/19/21  Yes Biagio Borg, MD  guaiFENesin (ROBITUSSIN) 100 MG/5ML liquid Take 5 mLs by mouth every 4 (four) hours as needed for cough or to loosen phlegm. Mucinex chest congestion (liquid)   Yes [provider]  ipratropium-albuterol (DUONEB) 0.5-2.5 (3) MG/3ML SOLN Take 3 mLs by nebulization every 6 (six) hours as needed. Patient taking differently: Take 3 mLs by nebulization every 6 (six) hours as needed (for shortness of breath). 11/05/21  Yes Rigoberto Noel,  MD  levothyroxine (SYNTHROID) 75 MCG tablet Take 1 tablet (75  mcg total) by mouth daily before breakfast. TAKE ONE TABLET BY MOUTH EARLY MORNING 02/26/22  Yes Shamleffer, Melanie Crazier, MD  OXYGEN Inhale 3 L into the lungs continuous. 24/7    [provider]  potassium chloride (KLOR-CON) 10 MEQ tablet TAKE 1 TABLET BY MOUTH 2 TIMES DAILY. Patient not taking: Reported on 04/28/2022 04/19/21   Biagio Borg, MD   Allergies  Allergen Reactions   Alendronate Sodium Other (See Comments)    Patient does not remember this reaction   Review of Systems +shortness of breath.   Physical Exam Alert and oriented x3 S1-S2  Clear breath sounds bilaterally Abdomen soft, nontender   Vital Signs: BP 121/62 (BP Location: Right Arm)   Pulse 89   Temp 97.7 F (36.5 C) (Oral)   Resp 18   Ht 5' (1.524 m)   Wt 52.5 kg   SpO2 95%   BMI 22.62 kg/m  Pain Scale: 0-10   Pain Score: 0-No pain   SpO2: SpO2: 95 % O2 Device:SpO2: 95 % O2 Flow Rate: .O2 Flow Rate (L/min): 3 L/min  IO: Intake/output summary:  Intake/Output Summary (Last 24 hours) at 04/29/2022 1057 Last data filed at 04/29/2022 0300 Gross per 24 hour  Intake 340 ml  Output 400 ml  Net -60 ml    LBM: Last BM Date : 04/27/22 Baseline Weight: Weight: 52.5 kg Most recent weight: Weight: 52.5 kg     Palliative Assessment/Data:   PPS 60%  Time In:  9 Time Out: 10  Time Total:  60  Greater than 50%  of this time was spent counseling and coordinating care related to the above assessment and plan.  Signed by: Loistine Chance, MD   Please contact Palliative Medicine Team phone at [phone] for questions and concerns.  For individual provider: See Shea Evans

## 2022-04-29 NOTE — TOC Initial Note (Signed)
Transition of Care [facility]) - Initial/Assessment Note    Patient Details  Name: Jasmine [name] MRN: [id number] Date of Birth: 1946-06-15  [facility]) CM/SW Contact:    Leeroy Cha, RN Phone Number: 04/29/2022, 9:06 AM  Clinical Narrative:                 Bipap order rec'd and sent request to [facility].  Plan is to return to the home.  Expected Discharge Plan: [place] Barriers to Discharge: Continued Medical Work up   Patient Goals and CMS Choice Patient states their goals for this hospitalization and ongoing recovery are:: not stated CMS Medicare.gov Compare Post Acute Care list provided to:: Legal Guardian Choice offered to / list presented to : [facility] POA / Guardian  Expected Discharge Plan and Services Expected Discharge Plan: [place]   Discharge Planning Services: CM Consult Post Acute Care Choice: Durable Medical Equipment Living arrangements for the past 2 months: Single Family Home                 DME Arranged: Bipap DME Agency: [facility] Date DME Agency Contacted: 04/29/22 Time DME Agency Contacted: [phone] Representative spoke with at DME Agency: [email or web address]            Prior Living Arrangements/Services Living arrangements for the past 2 months: [place] Lives with:: Significant Other Patient language and need for interpreter reviewed:: Yes Do you feel safe going back to the place where you live?: Yes            Criminal Activity/Legal Involvement Pertinent to Current Situation/Hospitalization: No - Comment as needed  Activities of Daily Living Home Assistive Devices/Equipment: Oxygen ADL Screening (condition at time of admission) Patient's cognitive ability adequate to safely complete daily activities?: Yes Is the patient deaf or have difficulty hearing?: No Does the patient have difficulty seeing, even when wearing glasses/contacts?: No Does the patient have difficulty concentrating,  remembering, or making decisions?: No Patient able to express need for assistance with ADLs?: Yes Does the patient have difficulty dressing or bathing?: No Independently performs ADLs?: Yes (appropriate for developmental age) Does the patient have difficulty walking or climbing stairs?: Yes Weakness of Legs: Both Weakness of Arms/Hands: Both  Permission Sought/Granted                  Emotional Assessment Appearance:: Appears stated age     Orientation: : Oriented to Self, Oriented to Place, Oriented to  Time, Oriented to Situation Alcohol / Substance Use: Not Applicable Psych Involvement: No (comment)  Admission diagnosis:  COPD exacerbation ([place]) [J44.1] COPD with acute exacerbation ([place]) [J44.1] Patient Active Problem List   Diagnosis Date Noted   Vitamin D deficiency 03/01/2022   Physical deconditioning 12/05/2021   COPD exacerbation ([place]) 07/30/2021   Diabetes mellitus due to underlying condition without complication, without long-term current use of insulin ([place]) 07/22/2021   Steroid-dependent chronic obstructive pulmonary disease ([place]) 07/22/2021   Postablative hypothyroidism 07/22/2021   Encounter for well adult exam with abnormal findings 04/20/2021   Malnutrition of moderate degree 08/16/2019   Respiratory acidosis 03/07/2019   Goals of care, counseling/discussion 09/20/2018   Edema due to congestive heart failure ([place]) 07/27/2018   Palliative care by specialist    Shortness of breath    Chronic low back pain 04/30/2018   COPD with acute exacerbation ([place])    Hypothyroidism    Anxiety    Respiratory failure with hypercapnia ([place]) 03/24/2018  Hypercarbia 03/22/2018   Polysubstance abuse ([place]) 01/26/2018   Aspiration into airway 06/03/2017   Rash 07/12/2016   Bilateral hearing loss 06/05/2016   Hyperglycemia 05/21/2016   Abnormal LFTs 03/24/2016   Solitary pulmonary nodule 01/09/2016   Acute on chronic respiratory failure with hypoxia (HCC)    On home  oxygen therapy    Chronic diastolic congestive heart failure ([place])    Bacteremia Step viridans 12/27/2015   Acute on chronic respiratory failure ([place]) 12/25/2015   Former smoker 12/25/2015   Hypothyroidism following radioiodine therapy 09/26/2014   Protein-calorie malnutrition, severe ([place]) 06/23/2014   Cor pulmonale ([place]) 01/12/2013   COPD (chronic obstructive pulmonary disease) ([place]) 11/29/2011   Macrocytosis without anemia 11/09/2011   Palliative care encounter 04/09/2011   SINUSITIS, CHRONIC 10/10/2010   Fatigue 10/10/2010   Depression with anxiety 07/21/2008   Essential hypertension 05/01/2007   ALLERGIC RHINITIS 05/01/2007   Asthma 05/01/2007   OSTEOPOROSIS 05/01/2007   PCP:  Biagio Borg, MD Pharmacy:   [facility], [address] [place] [place] [address] Phone: [phone] Fax: [phone]  [facility] [address] [address] Phone: [phone] Fax: [phone]     Social Determinants of Health (SDOH) Interventions    Readmission Risk Interventions    03/02/2020    3:30 PM 12/07/2019   10:24 AM 08/17/2019    1:07 PM  Readmission Risk Prevention Plan  Transportation Screening Complete Complete Complete  PCP or Specialist Appt within 3-5 Days  Complete   HRI or [place] Complete Complete   Social Work Consult for [place] Planning/Counseling  Complete   Palliative Care Screening Not Applicable Not Applicable   Medication Review Press photographer) Referral to Pharmacy Complete

## 2022-04-29 NOTE — Progress Notes (Signed)
[facility] [address] [employer] [facility]) Hospital Liaison note:  This is a pending outpatient-based Palliative Care patient. Will continue to follow for disposition.  Please call with any outpatient palliative questions or concerns.  Thank you, Lorelee Market, LPN [facility] Liaison [phone]

## 2022-04-29 NOTE — Discharge Instructions (Signed)

## 2022-04-29 NOTE — Progress Notes (Signed)
Echocardiogram 2D Echocardiogram has been performed.  Jasmine [name] 04/29/2022, 11:32 AM

## 2022-04-29 NOTE — Consult Note (Signed)
NAME:  Jasmine [name], MRN:  [id number], DOB:  Jul 30, 1946, LOS: 2 ADMISSION DATE:  04/27/2022, CONSULTATION DATE:  04/28/22 REFERRING MD:  Roel Cluck CHIEF COMPLAINT:  Dyspnea   History of Present Illness:  Jasmine [name] is a 76 y.o. female who has a PMH as below including but not limited to severe COPD (Followed by Dr. Elsworth Soho, last seen by pulmonary 12/05/21 by Rexene Edison. PFT's from 2014 FEV1 at 0.47 L( 30%), ratio 39 , No BD response (<200cc)). She presented to [facility] ED 7/9 with dyspnea and progressive intolerance to activities. States she has dyspnea at baseline and wears 3L O2. With exertion, she increased O2 to 4L and generally does OK and is able to get around slowly; however, recently, she has struggled to walk more then a few feet without becoming dyspneic and needing to rest. She continues to use Trelegy and DuoNebs at home. She was previously on Prednisone '10mg'$  daily per our office notes (from [facility] 12/05/21); however, she tells me that she has not taken this in "a while" after she was supposedly told to stop. She also used to use Trilogy overnight; however, due to a recall, she stopped taking this in 2022 (was also documented as such at Feb office visit).   In ED, ABG demonstrated chronic hypoxic and hypercapnic respiratory failure (7.28/105/49). Her chemistry showed a CO2 of 42. She was awake and able to answer all questions. CTA chest was negative for PE but showed emphysema as well as known stable pulmonary nodules. She wore BiPAP overnight in ED and states "I slept great".  PCCM called in consultation 7/10. She denies any recent fevers but feels she has had some chills. Does report PND and is questioning whether it's related to allergies. Denies chest pain, exposures to known sick contacts, recent travel, recent smoking, exposures to 2nd hand smoke. She does have cough that is worse when she has PND. She is interested in speaking with palliative care regarding her options given her recent  decline in ADL's and her symptom burden.  Pertinent  Medical History:  has Depression with anxiety; Essential hypertension; ALLERGIC RHINITIS; Asthma; OSTEOPOROSIS; SINUSITIS, CHRONIC; Fatigue; Palliative care encounter; Macrocytosis without anemia; COPD (chronic obstructive pulmonary disease) ([place]); Cor pulmonale ([place]); Protein-calorie malnutrition, severe ([place]); Hypothyroidism following radioiodine therapy; Acute on chronic respiratory failure ([place]); Former smoker; Bacteremia Step viridans; Acute on chronic respiratory failure with hypoxia ([place]); On home oxygen therapy; Chronic diastolic congestive heart failure ([place]); Solitary pulmonary nodule; Abnormal LFTs; Hyperglycemia; Bilateral hearing loss; Rash; Aspiration into airway; Polysubstance abuse ([place]); Hypercarbia; Respiratory failure with hypercapnia ([place]); COPD with acute exacerbation ([place]); Hypothyroidism; Anxiety; Chronic low back pain; Palliative care by specialist; Shortness of breath; Edema due to congestive heart failure ([place]); Goals of care, counseling/discussion; Respiratory acidosis; Malnutrition of moderate degree; Encounter for well adult exam with abnormal findings; Diabetes mellitus due to underlying condition without complication, without long-term current use of insulin ([place]); Steroid-dependent chronic obstructive pulmonary disease ([place]); Postablative hypothyroidism; COPD exacerbation ([place]); Physical deconditioning; and Vitamin D deficiency on their problem list.  Significant Hospital Events: Including procedures, antibiotic start and stop dates in addition to other pertinent events   7/9 admit. 7/10 PCCM consult.  Interim History / Subjective:  "I feel pretty good".  Feels improved from initial admit. Hungry and currently ordering breakfast. On 3L O2 though did wear BiPAP overnight.  Objective:  Blood pressure 121/62, pulse 89, temperature 97.7 F (36.5 C), temperature source Oral, resp. rate 18, height 5' (1.524 m), weight  52.5 kg, SpO2 97 %.        Intake/Output Summary (Last 24 hours) at 04/29/2022 0815 Last data filed at 04/29/2022 0300 Gross per 24 hour  Intake 340 ml  Output 400 ml  Net -60 ml   Filed Weights   04/27/22 1759  Weight: 52.5 kg    Examination: General: Adult elderly female, chronically ill appearing, resting in bed watching TV and ordering breakfast, in NAD. Neuro: A&O x 3, no deficits. HEENT: [place]/AT. Sclerae anicteric. EOMI. Cardiovascular: RRR, no M/R/G.  Lungs: Respirations even and unlabored.  CTAB. Abdomen: BS x 4, soft, NT/ND.  Musculoskeletal: No gross deformities, no edema.  Skin: Intact, warm, no rashes.  Labs/imaging personally reviewed:  CTA chest 7/9 > no PE. Emphysema. Mild atx vs infiltrate at bases. Stable pulmonary nodules. Aortic atherosclerosis.  Assessment & Plan:   Acute on chronic hypoxic and hypercapnic respiratory failure - 2/2 severe COPD. AECOPD. Former smoker. - Continue supplemental O2 as needed to maintain SpO2 > 88%. - Continue BiPAP PRN and QHS. - She should qualify for new BiPAP at home given her chronic hypercapnia. - Continue empiric CTX. - Follow cultures. - Continue BD's, steroids. - Would wean down to '10mg'$  Prednisone daily and maintain as this was what our last pulmonary notes state that she was on. - Bronchial hygiene. - F/u as outpatient in our office. - Continue tobacco cessation.  Post nasal drip - ? Allergy component. - Start Flonase, daily antihistamine.  Recent decline in ADL's and generalized weakness with FTT - guessing sequela of ongoing COPD etc. - PT eval done, recommending home health PT. - Palliative care consult, she is interested in hearing what her options are.  Pulmonary nodules - stable. - F/u as outpatient.  Best practice (evaluated daily):  Per primary team.    Montey Hora, [place] For pager details, please see AMION or use Epic chat  After 1900, please call  [place] for cross coverage needs 04/29/2022, 8:15 AM

## 2022-04-29 NOTE — Progress Notes (Signed)
Initial Nutrition Assessment  DOCUMENTATION CODES:   Non-severe (moderate) malnutrition in context of chronic illness  INTERVENTION:  - will order Ensure Plus High Protein BID, each supplement provides 350 kcal and 20 grams of protein.  - will add High Calorie, High Protein Nutrition Therapy handout to the AVS.   NUTRITION DIAGNOSIS:   Moderate Malnutrition related to chronic illness (COPD) as evidenced by mild fat depletion, mild muscle depletion, moderate muscle depletion.  GOAL:   Patient will meet greater than or equal to 90% of their needs  MONITOR:   PO intake, Supplement acceptance, Labs, Weight trends  REASON FOR ASSESSMENT:   Consult Assessment of nutrition requirement/status  ASSESSMENT:   76 year old female with history of end-stage COPD on 4L at home, type 2 DM, osteoporosis, hypothyroidism, depression, HTN, osteoporosis, CHG, asthma, anxiety, and lung cancer. She presented to the ED due to worsening shortness of breath. She was admitted for COPD exacerbation.  Patient sitting up in the chair with no visitors present at the time of RD visit. Patient shares that she lives with her ex-husband. Her lunch arrived toward the end of RD visit; assisted her in setting up meal and got her a cup of ice from nourishment room for her juice.  Patient reported that on admission shortness of breath was severe and that she was unable to eat d/t difficulties breathing. This has now resolved.  Yesterday she was able to eat 90% of dinner. She ate 100% of breakfast this AM which consisted of coffee and an omelet, which is a typical meal for her.  Patient shares that she plans to take the hospital food menu home with her to use as a resource to help her with meal planning.  Patient shares she would like to talk with [facility] about home health aides; RN at beside and reports that she will place a consult.   Weight on 7/9 was 116 lb, weight on 5/9 was 117 lb, and weight on 2/16 was 120 lb.  Of note, moderate pitting edema to BLE documented in the edema section of flow sheet.    Labs reviewed; CBGs: 123 and 134 mg/dl, Cl: 88 mmol/l, Mg: 1.6 mg/dl.  Medications reviewed; 40 mg oral lasix BID, sliding scale novolog, 75 mcg oral synthroid/day, 2 g IV Mg sulfate x1 run 7/11, 40 mg deltasone/day.    NUTRITION - FOCUSED PHYSICAL EXAM:  Flowsheet Row Most Recent Value  Orbital Region Moderate depletion  Upper Arm Region Mild depletion  Thoracic and Lumbar Region Unable to assess  Buccal Region Mild depletion  Temple Region Mild depletion  Clavicle Bone Region Moderate depletion  Clavicle and Acromion Bone Region Moderate depletion  Scapular Bone Region Mild depletion  Dorsal Hand Mild depletion  Patellar Region Unable to assess  Anterior Thigh Region Unable to assess  Posterior Calf Region Unable to assess  Edema (RD Assessment) Unable to assess  Hair Reviewed  Eyes Reviewed  Mouth Reviewed  [missing 2 front teeth]  Skin Reviewed  Nails Reviewed       Diet Order:   Diet Order             Diet regular Room service appropriate? Yes; Fluid consistency: Thin  Diet effective now                   EDUCATION NEEDS:   Education needs have been addressed  Skin:  Skin Assessment: Reviewed RN Assessment  Last BM:  11 (type 4 x1)  Height:   Ht Readings  from Last 1 Encounters:  04/27/22 5' (1.524 m)    Weight:   Wt Readings from Last 1 Encounters:  04/27/22 52.5 kg     BMI:  Body mass index is 22.62 kg/m.  Estimated Nutritional Needs:  Kcal:  1600-1900 kcal Protein:  80-95 grams Fluid:  >/= 1.7 L/day     Jarome Matin, MS, RD, LDN, CNSC Registered Dietitian II Inpatient Clinical Nutrition RD pager # and on-call/weekend pager # available in [facility]

## 2022-04-29 NOTE — Evaluation (Signed)
Occupational Therapy Evaluation Patient Details Name: Jasmine [name] MRN: [id number] DOB: 05-24-1946 Today's Date: 04/29/2022   History of Present Illness Pt is a 76 yo female admitted with COPD with acute exacerbation. PMH: end-stage COPD on 4 L at home, DM2, osteoporosis, hypothyroidism   Clinical Impression   Patient is a 76 year old female who was noted to have O2 drop to 75% after transfer with increased time for deep breathing 3 mins to bring back up to 91% on 4L/min. Patient was educated on stopping talking and focusing on deep breathing during session. Patient was noted to have decreased functional activity tolerance, decreased endurance, decreased standing balance, decreased safety awareness, and decreased knowledge of AD/AE impacting participation in ADLs. Patient would continue to benefit from skilled OT services at this time while admitted and after d/c to address noted deficits in order to improve overall safety and independence in ADLs.       Recommendations for follow up therapy are one component of a multi-disciplinary discharge planning process, led by the attending physician.  Recommendations may be updated based on patient status, additional functional criteria and insurance authorization.   Follow Up Recommendations  Home health OT    Assistance Recommended at Discharge Frequent or constant Supervision/Assistance  Patient can return home with the following A little help with walking and/or transfers;A little help with bathing/dressing/bathroom;Assistance with cooking/housework;Direct supervision/assist for financial management;Assist for transportation;Help with stairs or ramp for entrance;Direct supervision/assist for medications management    Functional Status Assessment  Patient has had a recent decline in their functional status and demonstrates the ability to make significant improvements in function in a reasonable and predictable amount of time.  Equipment  Recommendations       Recommendations for Other Services       Precautions / Restrictions Precautions Precautions: Fall Precaution Comments: desats quickly Restrictions Weight Bearing Restrictions: No      Mobility Bed Mobility Overal bed mobility: Needs Assistance Bed Mobility: Supine to Sit     Supine to sit: Supervision     General bed mobility comments: slow, increased time, light use of bedrail, dyspnea 3/4 with mobility    Transfers                          Balance                                           ADL either performed or assessed with clinical judgement   ADL Overall ADL's : Needs assistance/impaired Eating/Feeding: Set up;Sitting   Grooming: Set up;Sitting   Upper Body Bathing: Set up;Sitting Upper Body Bathing Details (indicate cue type and reason): destats quick with any activity. Lower Body Bathing: Minimal assistance;Sitting/lateral leans   Upper Body Dressing : Set up;Sitting   Lower Body Dressing: Sitting/lateral leans;Sit to/from stand;Moderate assistance   Toilet Transfer: Minimal assistance;Rolling walker (2 wheels) Toilet Transfer Details (indicate cue type and reason): with increased time and sequencing cues. Toileting- Clothing Manipulation and Hygiene: Minimal assistance;Sit to/from stand       Functional mobility during ADLs: Min guard;Rolling walker (2 wheels) General ADL Comments: short transfer with O2 dropping to 75% on 4L/min with 3 mins to deep breath O2 saturation back up to 91% on 4L/min.     Vision Baseline Vision/History: 1 Wears glasses Patient Visual Report: No change from baseline  Perception     Praxis      Pertinent Vitals/Pain Pain Assessment Pain Assessment: No/denies pain     Hand Dominance Right   Extremity/Trunk Assessment Upper Extremity Assessment Upper Extremity Assessment: Overall WFL for tasks assessed   Lower Extremity Assessment Lower Extremity  Assessment: Defer to PT evaluation   Cervical / Trunk Assessment Cervical / Trunk Assessment: Kyphotic   Communication Communication Communication: No difficulties   Cognition Arousal/Alertness: Awake/alert Behavior During Therapy: WFL for tasks assessed/performed Overall Cognitive Status: Within Functional Limits for tasks assessed                                 General Comments: noted to have some confusion at time during session. repeated herself multiple times.     General Comments       Exercises     Shoulder Instructions      Home Living Family/patient expects to be discharged to:: Private residence Living Arrangements: Spouse/significant other Available Help at Discharge: Family;Available 24 hours/day Type of Home: House Home Access: Ramped entrance     Home Layout: One level     Bathroom Shower/Tub: Occupational psychologist: Handicapped height     Home Equipment: Conservation officer, nature (2 wheels);Rollator (4 wheels);Shower seat   Additional Comments: on 4L O2 at baseline      Prior Functioning/Environment Prior Level of Function : Independent/Modified Independent             Mobility Comments: using rollator in the home and in community ADLs Comments: pt taking seated rest breaks to complete self care tasks and light housework, spouse assisting with housework        OT Problem List: Impaired balance (sitting and/or standing);Decreased safety awareness;Decreased activity tolerance;Cardiopulmonary status limiting activity;Decreased knowledge of precautions;Decreased knowledge of use of DME or AE      OT Treatment/Interventions: Self-care/ADL training;Neuromuscular education;DME and/or AE instruction;Energy conservation;Therapeutic activities;Balance training;Patient/family education    OT Goals(Current goals can be found in the care plan section) Acute Rehab OT Goals Patient Stated Goal: to get out of bed OT Goal Formulation:  Patient unable to participate in goal setting Time For Goal Achievement: 05/13/22 Potential to Achieve Goals: Fair  OT Frequency: Min 2X/week    Co-evaluation              AM-PAC OT "6 Clicks" Daily Activity     Outcome Measure Help from another person eating meals?: None Help from another person taking care of personal grooming?: None Help from another person toileting, which includes using toliet, bedpan, or urinal?: A Lot Help from another person bathing (including washing, rinsing, drying)?: A Lot Help from another person to put on and taking off regular upper body clothing?: A Little Help from another person to put on and taking off regular lower body clothing?: A Lot 6 Click Score: 17   End of Session Equipment Utilized During Treatment: Gait belt;Rolling walker (2 wheels) Nurse Communication: Other (comment) (ok to participate in session and to sit up in chair)  Activity Tolerance: Patient tolerated treatment well;Other (comment) (decreased O2) Patient left: in chair;with call bell/phone within reach  OT Visit Diagnosis: Unsteadiness on feet (R26.81);Muscle weakness (generalized) (M62.81)                Time: 7408-1448 OT Time Calculation (min): 39 min Charges:  OT General Charges $OT Visit: 1 Visit OT Evaluation $OT Eval Moderate Complexity: 1 Mod OT Treatments $  Self Care/Home Management : 23-37 mins  Jackelyn Poling OTR/L, [place] Acute Rehabilitation Department Office# [phone] Pager# [phone]   Marcellina Millin 04/29/2022, 1:13 PM

## 2022-04-29 NOTE — Progress Notes (Signed)
Ms. Pierro continues to exhibit signs of Acute on chronic hypercarbic and hypoxic respiratory failure due to COPD.  The use of the NIV will treat patient's high PC02 levels (73 on 04/29/22 with elevated bicarbonate of 53.1) and can reduce risk of exacerbations and future hospitalizations when used at night and during the day.  All alternate devices [phone] and [id number]) have been considered and ruled out as volume requirements are not met by BiLevel devices (specifically the BiPAP ST 10/5 with a back up rate of 20).  An NIV with volume-targeted pressure support is necessary to prevent patient from life-threatening harm.  Interruption or failure to provide NIV would quickly lead to exacerbation of the patient's condition, hospital re-admission, and likely harm to the patient. Continued use is preferred.  Patient is able to protect their airways and clear secretions on their own.

## 2022-04-30 DIAGNOSIS — Z7189 Other specified counseling: Secondary | ICD-10-CM | POA: Diagnosis not present

## 2022-04-30 DIAGNOSIS — J441 Chronic obstructive pulmonary disease with (acute) exacerbation: Secondary | ICD-10-CM | POA: Diagnosis not present

## 2022-04-30 DIAGNOSIS — Z515 Encounter for palliative care: Secondary | ICD-10-CM | POA: Diagnosis not present

## 2022-04-30 DIAGNOSIS — R0602 Shortness of breath: Secondary | ICD-10-CM | POA: Diagnosis not present

## 2022-04-30 LAB — GLUCOSE, CAPILLARY
Glucose-Capillary: 109 mg/dL — ABNORMAL HIGH (ref 70–99)
Glucose-Capillary: 199 mg/dL — ABNORMAL HIGH (ref 70–99)
Glucose-Capillary: 340 mg/dL — ABNORMAL HIGH (ref 70–99)
Glucose-Capillary: 89 mg/dL (ref 70–99)

## 2022-04-30 LAB — COMPREHENSIVE METABOLIC PANEL
ALT: 17 U/L (ref 0–44)
AST: 21 U/L (ref 15–41)
Albumin: 3.5 g/dL (ref 3.5–5.0)
Alkaline Phosphatase: 47 U/L (ref 38–126)
BUN: 27 mg/dL — ABNORMAL HIGH (ref 8–23)
CO2: >45 mmol/L — ABNORMAL HIGH (ref 22–32)
Calcium: 9.5 mg/dL (ref 8.9–10.3)
Chloride: 88 mmol/L — ABNORMAL LOW (ref 98–111)
Creatinine, Ser: 0.81 mg/dL (ref 0.44–1.00)
GFR, Estimated: >60 mL/min (ref >60)
Glucose, Bld: 106 mg/dL — ABNORMAL HIGH (ref 70–99)
Potassium: 3.1 mmol/L — ABNORMAL LOW (ref 3.5–5.1)
Sodium: 145 mmol/L (ref 135–145)
Total Bilirubin: 0.6 mg/dL (ref 0.3–1.2)
Total Protein: 6.2 g/dL — ABNORMAL LOW (ref 6.5–8.1)

## 2022-04-30 LAB — CBC
HCT: 36.8 % (ref 36.0–46.0)
Hemoglobin: 11.4 g/dL — ABNORMAL LOW (ref 12.0–15.0)
MCH: 30.8 pg (ref 26.0–34.0)
MCHC: 31 g/dL (ref 30.0–36.0)
MCV: 99.5 fL (ref 80.0–100.0)
Platelets: 173 10*3/uL (ref 150–400)
RBC: 3.7 MIL/uL — ABNORMAL LOW (ref 3.87–5.11)
RDW: 14.1 % (ref 11.5–15.5)
WBC: 9.1 10*3/uL (ref 4.0–10.5)
nRBC: 0 % (ref 0.0–0.2)

## 2022-04-30 LAB — MAGNESIUM: Magnesium: 2.1 mg/dL (ref 1.7–2.4)

## 2022-04-30 NOTE — TOC Progression Note (Addendum)
Transition of Care [facility]) - Progression Note    Patient Details  Name: Jasmine [name] MRN: [id number] Date of Birth: 1946-01-09  Transition of Care [facility]) CM/SW Contact  Leeroy Cha, RN Phone Number: 04/30/2022, 10:02 AM  Clinical Narrative:    Tct-zack, need order signed -message saent to Dr. Rodena Piety to sign, zack will get it and patient can probably leave later today. Pt is having second thoughts about using the bipap at home.  Bethanne Ginger is counseling the patient on this.  Bipap is approved.  Expected Discharge Plan: [place] Barriers to Discharge: Continued Medical Work up  Expected Discharge Plan and Services Expected Discharge Plan: [place]   Discharge Planning Services: CM Consult Post Acute Care Choice: Durable Medical Equipment Living arrangements for the past 2 months: Single Family Home                 DME Arranged: Bipap DME Agency: [employer] Date DME Agency Contacted: 04/29/22 Time DME Agency Contacted: [phone] Representative spoke with at DME Agency: zack             Social Determinants of Health ([place]) Interventions    Readmission Risk Interventions    03/02/2020    3:30 PM 12/07/2019   10:24 AM 08/17/2019    1:07 PM  Readmission Risk Prevention Plan  Transportation Screening Complete Complete Complete  PCP or Specialist Appt within 3-5 Days  Complete   HRI or [place] Complete Complete   Social Work Consult for [place] Planning/Counseling  Complete   Palliative Care Screening Not Applicable Not Applicable   Medication Review Press photographer) Referral to Pharmacy Complete

## 2022-04-30 NOTE — Care Management Important Message (Signed)
Important Message  Patient Details IM Letter given to the Patient. Name: ADRIAUNA CAMPTON MRN: [id number] Date of Birth: [birth year]   Medicare Important Message Given:  Yes     Kerin Salen 04/30/2022, 12:30 PM

## 2022-04-30 NOTE — Progress Notes (Signed)
PT Cancellation Note  Patient Details Name: Jasmine [name] MRN: [id number] DOB: Feb 16, 1946   Cancelled Treatment:     Therapist in to see pt this pm. Pt politely declined stating she was too fatigued to attempt out of bed and agreed to try next attempt.  Will continue PT per POC next available date/time.   [name] 04/30/2022, 4:11 PM

## 2022-04-30 NOTE — Progress Notes (Signed)
Daily Progress Note   Patient Name: Jasmine [name]       Date: 04/30/2022 DOB: 11/18/1945  Age: 76 y.o. MRN#: [id number] Attending Physician: Georgette Shell, MD Primary Care Physician: Biagio Borg, MD Admit Date: 04/27/2022  Reason for Consultation/Follow-up: Establishing goals of care  Subjective: Patient is awake alert resting in bed.  Denies shortness of breath this morning.  She did meet with palliative liaison from the community yesterday.  Length of Stay: 3  Current Medications: Scheduled Meds:   arformoterol  15 mcg Nebulization BID   aspirin EC  81 mg Oral Daily   atorvastatin  10 mg Oral Daily   budesonide (PULMICORT) nebulizer solution  0.5 mg Nebulization BID   cefdinir  300 mg Oral Q12H   citalopram  20 mg Oral Daily   enoxaparin (LOVENOX) injection  40 mg Subcutaneous Q24H   feeding supplement  237 mL Oral BID BM   fluticasone  1 spray Each Nare Daily   furosemide  40 mg Oral BID   guaiFENesin  600 mg Oral BID   insulin aspart  0-9 Units Subcutaneous TID WC   ipratropium-albuterol  3 mL Nebulization TID   levothyroxine  75 mcg Oral QAC breakfast   loratadine  10 mg Oral Daily   predniSONE  40 mg Oral Q breakfast   sodium chloride flush  3 mL Intravenous Q12H    Continuous Infusions:  sodium chloride      PRN Meds: sodium chloride, acetaminophen **OR** acetaminophen, sodium chloride flush  Physical Exam         Awake alert oriented Resting in bed Regular work of breathing Abdomen not distended No edema No focal deficits  Vital Signs: BP 117/66 (BP Location: Right Arm)   Pulse 99   Temp 98.2 F (36.8 C) (Oral)   Resp 18   Ht 5' (1.524 m)   Wt 52.5 kg   SpO2 97%   BMI 22.62 kg/m  SpO2: SpO2: 97 % O2 Device: O2 Device: Nasal Cannula O2  Flow Rate: O2 Flow Rate (L/min): 4 L/min  Intake/output summary:  Intake/Output Summary (Last 24 hours) at 04/30/2022 1039 Last data filed at 04/30/2022 0245 Gross per 24 hour  Intake 290 ml  Output 400 ml  Net -110 ml   LBM: Last BM Date : 04/27/22 Baseline  Weight: Weight: 52.5 kg Most recent weight: Weight: 52.5 kg       Palliative Assessment/Data:      Patient Active Problem List   Diagnosis Date Noted   Vitamin D deficiency 03/01/2022   Physical deconditioning 12/05/2021   COPD exacerbation ([place]) 07/30/2021   Diabetes mellitus due to underlying condition without complication, without long-term current use of insulin ([place]) 07/22/2021   Steroid-dependent chronic obstructive pulmonary disease ([place]) 07/22/2021   Postablative hypothyroidism 07/22/2021   Encounter for well adult exam with abnormal findings 04/20/2021   Malnutrition of moderate degree 08/16/2019   Respiratory acidosis 03/07/2019   Goals of care, counseling/discussion 09/20/2018   Edema due to congestive heart failure ([place]) 07/27/2018   Palliative care by specialist    Shortness of breath    Chronic low back pain 04/30/2018   COPD with acute exacerbation ([place])    Hypothyroidism    Anxiety    Respiratory failure with hypercapnia ([place]) 03/24/2018   Hypercarbia 03/22/2018   Polysubstance abuse ([place]) 01/26/2018   Aspiration into airway 06/03/2017   Rash 07/12/2016   Bilateral hearing loss 06/05/2016   Hyperglycemia 05/21/2016   Abnormal LFTs 03/24/2016   Solitary pulmonary nodule 01/09/2016   Acute on chronic respiratory failure with hypoxia (HCC)    On home oxygen therapy    Chronic diastolic congestive heart failure ([place])    Bacteremia Step viridans 12/27/2015   Acute on chronic respiratory failure ([place]) 12/25/2015   Former smoker 12/25/2015   Hypothyroidism following radioiodine therapy 09/26/2014   Protein-calorie malnutrition, severe ([place]) 06/23/2014   Cor pulmonale ([place]) 01/12/2013   COPD  (chronic obstructive pulmonary disease) ([place]) 11/29/2011   Macrocytosis without anemia 11/09/2011   Palliative care encounter 04/09/2011   SINUSITIS, CHRONIC 10/10/2010   Fatigue 10/10/2010   Depression with anxiety 07/21/2008   Essential hypertension 05/01/2007   ALLERGIC RHINITIS 05/01/2007   Asthma 05/01/2007   OSTEOPOROSIS 05/01/2007    Palliative Care Assessment & Plan   Patient Profile:    Assessment: 76 year old lady with severe chronic obstructive pulmonary disease seen in palliative consultation for goals of care discussions for gradual progressive decline in functional status and gradual progressive increase in symptom burden over the course of the past several months, admitted to hospital medicine service for acute on chronic hypoxic respiratory failure.  Recommendations/Plan: Full code for now, home with palliative services, outpatient pulmonary follow-up.  Discussed with the patient about progressive incurable nature of advanced chronic obstructive pulmonary disease, discussed about differences between hospice versus palliative services.  Patient wishes to start with palliative support at home.    Code Status:    Code Status Orders  (From admission, onward)           Start     Ordered   04/28/22 0436  Full code  Continuous        04/28/22 0435           Code Status History     Date Active Date Inactive Code Status Order ID Comments User Context   07/30/2021 2254 08/01/2021 1908 Full Code [id number]  Marcelyn Bruins, MD ED   07/30/2021 2214 07/30/2021 2254 DNR [id number]  Marcelyn Bruins, MD ED   03/01/2020 1512 03/05/2020 2111 DNR [id number]  Karmen Bongo, MD ED   01/29/2020 0846 02/03/2020 1546 DNR [id number]  Karmen Bongo, MD ED   12/04/2019 1547 12/07/2019 1735 DNR [id number]  Lequita Halt, MD ED   08/15/2019 1727 08/19/2019 1620 Partial Code [id number]  Nita Sells, MD  ED   08/14/2019 1742 08/15/2019 1727 DNR [id number]  Nita Sells, MD ED   12/16/2018 1554 12/21/2018 1610 Full Code [id number]  Merton Border, MD Inpatient   05/24/2018 1142 05/28/2018 1928 Full Code [id number]  Arnell Asal, NP ED   03/27/2018 2241 04/02/2018 1706 Full Code [id number]  Vianne Bulls, MD ED   03/24/2018 2256 03/26/2018 1850 Full Code [id number]  Toy Baker, MD Inpatient   01/07/2018 1152 01/09/2018 1710 Full Code [id number]  Marijean Heath, NP ED   05/21/2016 0149 05/23/2016 2005 Full Code [id number]  Karmen Bongo, MD Inpatient   03/24/2016 1358 03/27/2016 2121 Full Code [id number]  Rondel Jumbo, PA-C ED   12/28/2015 0101 01/01/2016 1619 Full Code [id number]  Rise Patience, MD Inpatient   12/25/2015 0103 12/27/2015 1559 Full Code [id number]  Reubin Milan, MD Inpatient   06/20/2014 0407 06/25/2014 2238 Full Code [id number]  Toy Baker, MD Inpatient   11/09/2011 2234 11/12/2011 1949 Full Code [id number]  Suzie Portela, RN Inpatient       Prognosis:  < 12 months  Discharge Planning: Home with Palliative Services  Care plan was discussed with patient.    Thank you for allowing the Palliative Medicine Team to assist in the care of this patient.    Greater than 50%  of this time was spent counseling and coordinating care related to the above assessment and plan.  Loistine Chance, MD  Please contact Palliative Medicine Team phone at [phone] for questions and concerns.

## 2022-05-01 ENCOUNTER — Other Ambulatory Visit (HOSPITAL_COMMUNITY): Payer: Self-pay

## 2022-05-01 DIAGNOSIS — J441 Chronic obstructive pulmonary disease with (acute) exacerbation: Secondary | ICD-10-CM | POA: Diagnosis not present

## 2022-05-01 LAB — GLUCOSE, CAPILLARY: Glucose-Capillary: 109 mg/dL — ABNORMAL HIGH (ref 70–99)

## 2022-05-01 MED ORDER — LORATADINE 10 MG PO TABS: 10.0000 mg | ORAL_TABLET | Freq: Every day | ORAL | Status: DC

## 2022-05-01 MED ORDER — CEFDINIR 300 MG PO CAPS
300.0000 mg | ORAL_CAPSULE | Freq: Two times a day (BID) | ORAL | 0 refills | Status: DC
  Filled 2022-05-01: qty 4, 2d supply, fill #0

## 2022-05-01 MED ORDER — ACETAMINOPHEN 325 MG PO TABS
650.0000 mg | ORAL_TABLET | Freq: Four times a day (QID) | ORAL | Status: DC | PRN
Start: 2022-05-01 — End: 2023-01-09

## 2022-05-01 MED ORDER — POTASSIUM CHLORIDE CRYS ER 10 MEQ PO TBCR
10.0000 meq | EXTENDED_RELEASE_TABLET | Freq: Every day | ORAL | 6 refills | Status: DC
  Filled 2022-05-01: qty 30, 30d supply, fill #0

## 2022-05-01 MED ORDER — FUROSEMIDE 20 MG PO TABS
20.0000 mg | ORAL_TABLET | Freq: Every day | ORAL | 11 refills | Status: DC
  Filled 2022-05-01: qty 30, 30d supply, fill #0

## 2022-05-01 MED ORDER — FLUTICASONE PROPIONATE 50 MCG/ACT NA SUSP: 1.0000 | Freq: Every day | NASAL | 2 refills | Status: DC

## 2022-05-01 NOTE — Discharge Summary (Addendum)
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  This has been going on for the past few days, she ran out of her rescue inhaler, and felt progressively worse and called 911   Discharge Diagnoses:  Principal Problem:   COPD with acute exacerbation ([place]) Active Problems:   Essential hypertension   Cor pulmonale (HCC)   Acute on chronic respiratory failure with hypoxia (HCC)   Chronic diastolic congestive heart failure (HCC)   Solitary pulmonary nodule   Hypothyroidism   Diabetes mellitus due to underlying condition without complication, without long-term current use of insulin (HCC)    Acute on chronic hypercarbic and hypoxic respiratory failure due to COPD exacerbation-she was treated with IV steroids nebulizers and antibiotics.  CT angiogram on admission showed mild infiltrates at the lung bases versus atelectasis.  COVID, RVP negative.   -Patient has chronic hypercarbia she is aware she needs to follow-up with pulmonary for a sleep study.   Active problems Chronic diastolic CHF-she was treated with Lasix.  Continue Lasix.  Echo shows EF 39-03%, grade 1 diastolic dysfunction.  RV was normal.    DM2-diet controlled she was treated with SSI   CBG (last 3)  Recent Labs (last 2 labs)        Recent Labs    04/28/22 2031 04/29/22 0721 04/29/22 1216  GLUCAP 142* 123* 134*       Recent Labs       Lab Results   Component Value Date    HGBA1C 6.5 (A) 02/25/2022      Hyperlipidemia-continue statin  Essential hypertension-continue furosemide  Hypothyroidism-continue Synthroid   Solitary pulmonary nodule-Chronic will need to follow up with pulmonology     Nutrition Problem: Moderate Malnutrition Etiology: chronic illness (COPD)    Signs/Symptoms: mild fat depletion, mild muscle depletion, moderate muscle depletion     Interventions: Ensure Enlive (each supplement provides 350kcal and 20 grams of protein)  Estimated body mass index is 22.62 kg/m as calculated from the following:   Height as of this encounter: 5' (1.524 m).   Weight as of this encounter: 52.5 kg.  Discharge Instructions  Discharge Instructions     Diet - low sodium heart healthy   Complete by: As directed    Increase activity slowly   Complete by: As directed       Allergies as of 05/01/2022       Reactions   Alendronate Sodium Other (See Comments)   Patient does not remember this reaction        Medication List     STOP taking these medications    potassium chloride 10 MEQ tablet Commonly known as: KLOR-CON       TAKE these medications    acetaminophen 325 MG tablet Commonly known as: TYLENOL Take 2 tablets (650 mg total) by mouth every 6 (six) hours as needed for mild pain (or Fever >/= 101).   albuterol 108 (90 Base) MCG/ACT inhaler Commonly known as: VENTOLIN HFA INHALE 1 TO 2 PUFFS INTO THE  LUNGS EVERY 6 HOURS AS NEEDED FOR WHEEZING OR SHORTNESS OF BREATH   aspirin EC 81 MG tablet Take 1 tablet (81 mg total) by mouth daily.   atorvastatin 10 MG tablet Commonly known as: LIPITOR TAKE 1 TABLET BY MOUTH DAILY. What changed:  how much to take how to take this when to take this additional instructions   cefdinir 300 MG capsule Commonly known as: OMNICEF Take 1 capsule (300 mg total) by mouth 2 (two) times daily.   citalopram 20 MG tablet Commonly known as: CELEXA TAKE ONE  TABLET BY MOUTH DAILY What changed:  how much to take how to take this when to take this   fluticasone 50 MCG/ACT nasal spray Commonly known as: FLONASE Place 1 spray into both nostrils daily.   furosemide 20 MG tablet Commonly known as: Lasix Take 1 tablet (20 mg total) by mouth daily. What changed:  medication strength how much to take how to take this when to take this additional instructions   guaiFENesin 100 MG/5ML liquid Commonly known as: ROBITUSSIN Take 5 mLs by mouth every 4 (four) hours as needed for cough or to loosen phlegm. Mucinex chest congestion (liquid)   ipratropium-albuterol 0.5-2.5 (3) MG/3ML Soln Commonly known as: DUONEB Take 3 mLs by nebulization every 6 (six) hours as needed. What changed: reasons to take this   levothyroxine 75 MCG tablet Commonly known as: SYNTHROID Take 1 tablet (75 mcg total) by mouth daily before breakfast. TAKE ONE TABLET BY MOUTH EARLY MORNING   loratadine 10 MG tablet Commonly known as: CLARITIN Take 1 tablet (10 mg total) by mouth daily.   OXYGEN Inhale 3 L into the lungs continuous. 24/7   potassium chloride 10 MEQ tablet Commonly known as: KLOR-CON M Take 1 tablet (10 mEq total) by mouth daily.   Trelegy Ellipta 200-62.5-25 MCG/ACT Aepb Generic drug: Fluticasone-Umeclidin-Vilant Inhale 1 puff into the lungs daily.        Follow-up Information     Biagio Borg, MD Follow up.   Specialties: Internal Medicine, Radiology Contact information: [place] [address] [phone]                Allergies  Allergen Reactions   Alendronate Sodium Other (See Comments)    Patient does not remember this reaction    Consultations: PCCM   Procedures/Studies: ECHOCARDIOGRAM COMPLETE  Result Date: 04/29/2022    ECHOCARDIOGRAM REPORT   Patient Name:   Jasmine [name] Date of Exam: 04/29/2022 Medical Rec #:  [id number]       Height:       60.0 in Accession #:    [id number]      Weight:        115.8 lb Date of Birth:  03/26/1946       BSA:          1.480 m Patient Age:    22 years        BP:           121/62 mmHg Patient Gender: F               HR:           90 bpm. Exam Location:  Inpatient Procedure: 2D Echo, Cardiac Doppler and Color Doppler Indications:    Dyspnea  History:        Patient has prior history of Echocardiogram examinations, most                 recent  07/31/2021. CHF, COPD, Signs/Symptoms:Shortness of                 Breath; Risk Factors:Hypertension.  Sonographer:    Joette Catching RCS Referring Phys: [place]  1. Left ventricular ejection fraction, by estimation, is 70 to 75%. The left ventricle has hyperdynamic function. The left ventricle has no regional wall motion abnormalities. Left ventricular diastolic parameters are consistent with Grade I diastolic dysfunction (impaired relaxation).  2. Right ventricular systolic function is normal. The right ventricular size is normal.  3. The mitral valve is normal in structure. No evidence of mitral valve regurgitation. No evidence of mitral stenosis.  4. The aortic valve is normal in structure. Aortic valve regurgitation is not visualized. No aortic stenosis is present.  5. The inferior vena cava is normal in size with <50% respiratory variability, suggesting right atrial pressure of 8 mmHg. Comparison(s): No significant change from prior study. Prior images reviewed side by side. FINDINGS  Left Ventricle: Left ventricular ejection fraction, by estimation, is 70 to 75%. The left ventricle has hyperdynamic function. The left ventricle has no regional wall motion abnormalities. The left ventricular internal cavity size was normal in size. There is no left ventricular hypertrophy. Left ventricular diastolic parameters are consistent with Grade I diastolic dysfunction (impaired relaxation). Indeterminate filling pressures. Right Ventricle: The right ventricular size is normal. No increase in right ventricular  wall thickness. Right ventricular systolic function is normal. Left Atrium: Left atrial size was normal in size. Right Atrium: Right atrial size was normal in size. Pericardium: There is no evidence of pericardial effusion. Mitral Valve: The mitral valve is normal in structure. No evidence of mitral valve regurgitation. No evidence of mitral valve stenosis. Tricuspid Valve: The tricuspid valve is normal in structure. Tricuspid valve regurgitation is not demonstrated. No evidence of tricuspid stenosis. Aortic Valve: The aortic valve is normal in structure. Aortic valve regurgitation is not visualized. No aortic stenosis is present. Aortic valve mean gradient measures 8.0 mmHg. Aortic valve peak gradient measures 14.3 mmHg. Aortic valve area, by VTI measures 2.13 cm. Pulmonic Valve: The pulmonic valve was normal in structure. Pulmonic valve regurgitation is not visualized. No evidence of pulmonic stenosis. Aorta: The aortic root is normal in size and structure. Venous: The inferior vena cava is normal in size with less than 50% respiratory variability, suggesting right atrial pressure of 8 mmHg. IAS/Shunts: No atrial level shunt detected by color flow Doppler.  LEFT VENTRICLE PLAX 2D LVIDd:         3.20 cm   Diastology LVIDs:         2.00 cm   LV e' medial:    6.20 cm/s LV PW:         0.85 cm   LV E/e' medial:  15.7 LV IVS:        1.00 cm   LV e' lateral:   9.68 cm/s LVOT diam:     1.70 cm   LV E/e' lateral: 10.1 LV SV:         71 LV SV Index:   48 LVOT Area:     2.27 cm  RIGHT VENTRICLE             IVC RV Basal diam:  3.70 cm     IVC diam: 1.90 cm RV Mid diam:    2.00 cm RV S prime:     14.70 cm/s TAPSE (M-mode): 2.3 cm LEFT ATRIUM  Index        RIGHT ATRIUM           Index LA diam:        2.30 cm 1.55 cm/m   RA Area:     13.40 cm LA Vol (A2C):   20.0 ml 13.52 ml/m  RA Volume:   33.30 ml  22.50 ml/m LA Vol (A4C):   19.1 ml 12.91 ml/m LA Biplane Vol: 20.6 ml 13.92 ml/m  AORTIC VALVE                      PULMONIC VALVE AV Area (Vmax):    2.02 cm      PV Vmax:       1.33 m/s AV Area (Vmean):   1.98 cm      PV Peak grad:  7.1 mmHg AV Area (VTI):     2.13 cm AV Vmax:           189.00 cm/s AV Vmean:          132.000 cm/s AV VTI:            0.334 m AV Peak Grad:      14.3 mmHg AV Mean Grad:      8.0 mmHg LVOT Vmax:         168.00 cm/s LVOT Vmean:        115.000 cm/s LVOT VTI:          0.314 m LVOT/AV VTI ratio: 0.94  AORTA Ao Root diam: 2.90 cm Ao Asc diam:  2.40 cm MITRAL VALVE MV Area (PHT): 5.54 cm     SHUNTS MV Decel Time: 137 msec     Systemic VTI:  0.31 m MV E velocity: 97.50 cm/s   Systemic Diam: 1.70 cm MV A velocity: 108.00 cm/s MV E/A ratio:  0.90 Mihai Croitoru MD Electronically signed by Sanda Klein MD Signature Date/Time: 04/29/2022/12:47:46 PM    Final    CT Angio Chest PE W and/or Wo Contrast  Result Date: 04/27/2022 CLINICAL DATA:  Pulmonary embolism suspected, high probability. Chronic shortness of breath, chest pain, and COPD. EXAM: CT ANGIOGRAPHY CHEST WITH CONTRAST TECHNIQUE: Multidetector CT imaging of the chest was performed using the standard protocol during bolus administration of intravenous contrast. Multiplanar CT image reconstructions and MIPs were obtained to evaluate the vascular anatomy. RADIATION DOSE REDUCTION: This exam was performed according to the departmental dose-optimization program which includes automated exposure control, adjustment of the mA and/or kV according to patient size and/or use of iterative reconstruction technique. CONTRAST:  70m OMNIPAQUE IOHEXOL 350 MG/ML SOLN COMPARISON:  07/30/2021. FINDINGS: Cardiovascular: The heart is normal in size and there is a trace pericardial effusion. Scattered coronary artery calcifications are noted. There is atherosclerotic calcification of the aorta without evidence of aneurysm. The pulmonary trunk is normal in caliber. No pulmonary artery filling defect is identified. Mediastinum/Nodes: No enlarged mediastinal, hilar,  or axillary lymph nodes. Thyroid gland, trachea, and esophagus demonstrate no significant findings. Lungs/Pleura: Emphysematous changes are present in the lungs. Mild atelectasis or infiltrate is noted at the lung bases. No effusion or pneumothorax. There is a stable nodular opacity in the left upper lobe measuring 1 cm, axial image 43. There is a stable 2 mm nodule in the left upper lobe, axial image 46. Stable scarring or atelectasis is noted in the posterior segment of the right upper lobe, axial image 58. There is a stable nodular opacity in the right upper lobe measuring 6 mm, axial  image 55. Upper Abdomen: No acute abnormality. Musculoskeletal: Degenerative changes are present in the thoracolumbar spine. No acute osseous abnormality. Review of the MIP images confirms the above findings. IMPRESSION: 1. No evidence of pulmonary embolism. 2. Mild atelectasis or infiltrate at the lung bases. Stable atelectasis or scarring in the posterior segment of the right upper lobe. 3. Stable bilateral pulmonary nodules, the largest measuring 1 cm. Consider one of the following in 3 months for both low-risk and high-risk individuals: (a) repeat chest CT, (b) follow-up PET-CT, or (c) tissue sampling. This recommendation follows the consensus statement: Guidelines for Management of Incidental Pulmonary Nodules Detected on CT Images: From the Fleischner Society 2017; Radiology 2017; 284:228-243. 4. Emphysema. 5. Aortic atherosclerosis with coronary artery calcifications. Electronically Signed   By: Brett Fairy M.D.   On: 04/27/2022 21:13   DG Chest Port 1 View  Result Date: 04/27/2022 CLINICAL DATA:  Shortness of breath. EXAM: PORTABLE CHEST 1 VIEW COMPARISON:  Chest x-ray 07/30/2021.  CT chest 07/30/2021. FINDINGS: Right upper lobe nodular density appears unchanged. There is no new focal lung infiltrate, pleural effusion or pneumothorax. Cardiomediastinal silhouette is within normal limits. No acute fractures. IMPRESSION:  1. No acute cardiopulmonary process. 2. Stable right upper lobe nodular density. Electronically Signed   By: Ronney Asters M.D.   On: 04/27/2022 16:58   (Echo, Carotid, EGD, Colonoscopy, ERCP)    Subjective: Patient resting in bed anxious to go home  Discharge Exam: Vitals:   05/01/22 0727 05/01/22 0732  BP:    Pulse:    Resp:    Temp:    SpO2: 100% 100%   Vitals:   04/30/22 2018 05/01/22 0611 05/01/22 0727 05/01/22 0732  BP: (!) 110/57 (!) 110/57    Pulse: 94 78    Resp: 18 20    Temp: 98.2 F (36.8 C) 98 F (36.7 C)    TempSrc: Oral Oral    SpO2: 96% 94% 100% 100%  Weight:      Height:        General: Pt is alert, awake, not in acute distress Cardiovascular: RRR, S1/S2 +, no rubs, no gallops Respiratory: CTA bilaterally, no wheezing, no rhonchi Abdominal: Soft, NT, ND, bowel sounds + Extremities: no edema, no cyanosis    The results of significant diagnostics from this hospitalization (including imaging, microbiology, ancillary and laboratory) are listed below for reference.     Microbiology: Recent Results (from the past 240 hour(s))  SARS Coronavirus 2 by RT PCR (hospital order, performed in [facility] hospital lab) *cepheid single result test* Anterior Nasal Swab     Status: None   Collection Time: 04/28/22 12:35 AM   Specimen: Anterior Nasal Swab  Result Value Ref Range Status   SARS Coronavirus 2 by RT PCR NEGATIVE NEGATIVE Final    Comment: (NOTE) SARS-CoV-2 target nucleic acids are NOT DETECTED.  The SARS-CoV-2 RNA is generally detectable in upper and lower respiratory specimens during the acute phase of infection. The lowest concentration of SARS-CoV-2 viral copies this assay can detect is 250 copies / mL. A negative result does not preclude SARS-CoV-2 infection and should not be used as the sole basis for treatment or other patient management decisions.  A negative result may occur with improper specimen collection / handling, submission of  specimen other than nasopharyngeal swab, presence of viral mutation(s) within the areas targeted by this assay, and inadequate number of viral copies (<250 copies / mL). A negative result must be combined with clinical observations, patient history,  and epidemiological information.  Fact Sheet for Patients:   https://www.patel.info/  Fact Sheet for Healthcare Providers: https://hall.com/  This test is not yet approved or  cleared by the [place] FDA and has been authorized for detection and/or diagnosis of SARS-CoV-2 by FDA under an Emergency Use Authorization (EUA).  This EUA will remain in effect (meaning this test can be used) for the duration of the COVID-19 declaration under Section 564(b)(1) of the Act, 21 U.S.C. section 360bbb-3(b)(1), unless the authorization is terminated or revoked sooner.  Performed at [facility], [place] [address], [place], [place] [address]   Respiratory (~20 pathogens) panel by PCR     Status: None   Collection Time: 04/28/22  4:36 AM   Specimen: Nasopharyngeal Swab; Respiratory  Result Value Ref Range Status   Adenovirus NOT DETECTED NOT DETECTED Final   Coronavirus 229E NOT DETECTED NOT DETECTED Final    Comment: (NOTE) The Coronavirus on the Respiratory Panel, DOES NOT test for the novel  Coronavirus (2019 nCoV)    Coronavirus HKU1 NOT DETECTED NOT DETECTED Final   Coronavirus NL63 NOT DETECTED NOT DETECTED Final   Coronavirus OC43 NOT DETECTED NOT DETECTED Final   Metapneumovirus NOT DETECTED NOT DETECTED Final   Rhinovirus / Enterovirus NOT DETECTED NOT DETECTED Final   Influenza A NOT DETECTED NOT DETECTED Final   Influenza B NOT DETECTED NOT DETECTED Final   Parainfluenza Virus 1 NOT DETECTED NOT DETECTED Final   Parainfluenza Virus 2 NOT DETECTED NOT DETECTED Final   Parainfluenza Virus 3 NOT DETECTED NOT DETECTED Final   Parainfluenza Virus 4 NOT DETECTED NOT DETECTED  Final   Respiratory Syncytial Virus NOT DETECTED NOT DETECTED Final   Bordetella pertussis NOT DETECTED NOT DETECTED Final   Bordetella Parapertussis NOT DETECTED NOT DETECTED Final   Chlamydophila pneumoniae NOT DETECTED NOT DETECTED Final   Mycoplasma pneumoniae NOT DETECTED NOT DETECTED Final    Comment: Performed at [facility], [place]. [address] [place], [place] [address]  MRSA Next Gen by PCR, Nasal     Status: None   Collection Time: 04/28/22  4:36 AM   Specimen: Nasopharyngeal Swab; Nasal Swab  Result Value Ref Range Status   MRSA by PCR Next Gen NOT DETECTED NOT DETECTED Final    Comment: (NOTE) The GeneXpert MRSA Assay (FDA approved for NASAL specimens only), is one component of a comprehensive MRSA colonization surveillance program. It is not intended to diagnose MRSA infection nor to guide or monitor treatment for MRSA infections. Test performance is not FDA approved in patients less than 59 years old. Performed at [facility], [place] [address], [place], [place] [address]      Labs: BNP (last 3 results) Recent Labs    07/30/21 1521 04/27/22 1642  BNP 28.0 83.4   Basic Metabolic Panel: Recent Labs  Lab 04/27/22 1642 04/28/22 1355 04/28/22 1356 04/29/22 0420 04/30/22 0430  NA 142  --  137 143 145  K 4.0  --  4.2 4.4 3.1*  CL 91*  --  89* 88* 88*  CO2 42*  --  39* 45* >45*  GLUCOSE 103*  --  120* 152* 106*  BUN 14  --  16 21 27*  CREATININE 0.68  --  0.62 0.72 0.81  CALCIUM 9.7  --  9.4 9.5 9.5  MG  --  1.6*  --  1.6* 2.1  PHOS  --  3.4  --   --   --    Liver  Function Tests: Recent Labs  Lab 04/27/22 1642 04/28/22 1356 04/29/22 0420 04/30/22 0430  AST 34 '27 25 21  '$ ALT '22 20 17 17  '$ ALKPHOS 57 52 46 47  BILITOT 1.0 0.7 0.7 0.6  PROT 7.9 6.9 6.5 6.2*  ALBUMIN 4.2 3.7 3.4* 3.5   No results for input(s): "LIPASE", "AMYLASE" in the last 168 hours. No results for input(s): "AMMONIA" in the last 168 hours. CBC: Recent Labs   Lab 04/27/22 1642 04/28/22 0500 04/29/22 0420 04/30/22 0430  WBC 6.4 4.2 6.8 9.1  NEUTROABS 3.9  --   --   --   HGB 13.3 12.3 11.5* 11.4*  HCT 44.8 41.7 37.8 36.8  MCV 103.2* 104.5* 99.7 99.5  PLT 149* 148* 152 173   Cardiac Enzymes: Recent Labs  Lab 04/28/22 1355  CKTOTAL 77   BNP: Invalid input(s): "POCBNP" CBG: Recent Labs  Lab 04/30/22 0750 04/30/22 1157 04/30/22 1607 04/30/22 2133 05/01/22 0728  GLUCAP 109* 199* 340* 89 109*   D-Dimer No results for input(s): "DDIMER" in the last 72 hours. Hgb A1c No results for input(s): "HGBA1C" in the last 72 hours. Lipid Profile No results for input(s): "CHOL", "HDL", "LDLCALC", "TRIG", "CHOLHDL", "LDLDIRECT" in the last 72 hours. Thyroid function studies No results for input(s): "TSH", "T4TOTAL", "T3FREE", "THYROIDAB" in the last 72 hours.  Invalid input(s): "FREET3"  Anemia work up No results for input(s): "VITAMINB12", "FOLATE", "FERRITIN", "TIBC", "IRON", "RETICCTPCT" in the last 72 hours. Urinalysis    Component Value Date/Time   COLORURINE YELLOW 02/25/2022 1549   APPEARANCEUR CLEAR 02/25/2022 1549   LABSPEC 1.010 02/25/2022 1549   PHURINE 7.0 02/25/2022 1549   GLUCOSEU NEGATIVE 02/25/2022 1549   HGBUR NEGATIVE 02/25/2022 1549   BILIRUBINUR NEGATIVE 02/25/2022 1549   BILIRUBINUR negative 08/11/2011 1358   KETONESUR NEGATIVE 02/25/2022 1549   PROTEINUR 30 (A) 01/29/2020 0532   UROBILINOGEN 0.2 02/25/2022 1549   NITRITE NEGATIVE 02/25/2022 1549   LEUKOCYTESUR NEGATIVE 02/25/2022 1549   Sepsis Labs Recent Labs  Lab 04/27/22 1642 04/28/22 0500 04/29/22 0420 04/30/22 0430  WBC 6.4 4.2 6.8 9.1   Microbiology Recent Results (from the past 240 hour(s))  SARS Coronavirus 2 by RT PCR (hospital order, performed in [place] hospital lab) *cepheid single result test* Anterior Nasal Swab     Status: None   Collection Time: 04/28/22 12:35 AM   Specimen: Anterior Nasal Swab  Result Value Ref Range Status    SARS Coronavirus 2 by RT PCR NEGATIVE NEGATIVE Final    Comment: (NOTE) SARS-CoV-2 target nucleic acids are NOT DETECTED.  The SARS-CoV-2 RNA is generally detectable in upper and lower respiratory specimens during the acute phase of infection. The lowest concentration of SARS-CoV-2 viral copies this assay can detect is 250 copies / mL. A negative result does not preclude SARS-CoV-2 infection and should not be used as the sole basis for treatment or other patient management decisions.  A negative result may occur with improper specimen collection / handling, submission of specimen other than nasopharyngeal swab, presence of viral mutation(s) within the areas targeted by this assay, and inadequate number of viral copies (<250 copies / mL). A negative result must be combined with clinical observations, patient history, and epidemiological information.  Fact Sheet for Patients:   https://www.patel.info/  Fact Sheet for Healthcare Providers: https://hall.com/  This test is not yet approved or  cleared by the [place] FDA and has been authorized for detection and/or diagnosis of SARS-CoV-2 by FDA under an Emergency Use Authorization (  EUA).  This EUA will remain in effect (meaning this test can be used) for the duration of the COVID-19 declaration under Section 564(b)(1) of the Act, 21 U.S.C. section 360bbb-3(b)(1), unless the authorization is terminated or revoked sooner.  Performed at [facility], [place] [address], [place], [place] [address]   Respiratory (~20 pathogens) panel by PCR     Status: None   Collection Time: 04/28/22  4:36 AM   Specimen: Nasopharyngeal Swab; Respiratory  Result Value Ref Range Status   Adenovirus NOT DETECTED NOT DETECTED Final   Coronavirus 229E NOT DETECTED NOT DETECTED Final    Comment: (NOTE) The Coronavirus on the Respiratory Panel, DOES NOT test for the novel  Coronavirus (2019  nCoV)    Coronavirus HKU1 NOT DETECTED NOT DETECTED Final   Coronavirus NL63 NOT DETECTED NOT DETECTED Final   Coronavirus OC43 NOT DETECTED NOT DETECTED Final   Metapneumovirus NOT DETECTED NOT DETECTED Final   Rhinovirus / Enterovirus NOT DETECTED NOT DETECTED Final   Influenza A NOT DETECTED NOT DETECTED Final   Influenza B NOT DETECTED NOT DETECTED Final   Parainfluenza Virus 1 NOT DETECTED NOT DETECTED Final   Parainfluenza Virus 2 NOT DETECTED NOT DETECTED Final   Parainfluenza Virus 3 NOT DETECTED NOT DETECTED Final   Parainfluenza Virus 4 NOT DETECTED NOT DETECTED Final   Respiratory Syncytial Virus NOT DETECTED NOT DETECTED Final   Bordetella pertussis NOT DETECTED NOT DETECTED Final   Bordetella Parapertussis NOT DETECTED NOT DETECTED Final   Chlamydophila pneumoniae NOT DETECTED NOT DETECTED Final   Mycoplasma pneumoniae NOT DETECTED NOT DETECTED Final    Comment: Performed at [facility], [place]. [address], [place], [place] [address]  MRSA Next Gen by PCR, Nasal     Status: None   Collection Time: 04/28/22  4:36 AM   Specimen: Nasopharyngeal Swab; Nasal Swab  Result Value Ref Range Status   MRSA by PCR Next Gen NOT DETECTED NOT DETECTED Final    Comment: (NOTE) The GeneXpert MRSA Assay (FDA approved for NASAL specimens only), is one component of a comprehensive MRSA colonization surveillance program. It is not intended to diagnose MRSA infection nor to guide or monitor treatment for MRSA infections. Test performance is not FDA approved in patients less than 52 years old. Performed at [facility], [place] [address], [place],  [address]      Time coordinating discharge: 35 minutes  SIGNED:   Georgette Shell, MD  [facility] 05/02/2022, 4:49 PM

## 2022-05-01 NOTE — TOC Transition Note (Addendum)
Transition of Care [facility]) - CM/SW Discharge Note   Patient Details  Name: Jasmine [name] MRN: [id number] Date of Birth: Nov 17, 1945  [facility] CM/SW Contact:  Leeroy Cha, RN Phone Number: 05/01/2022, 11:12 AM   Clinical Narrative:    [id number]/tct-paula bennett legal guardian/informed of pending discharge to home with bipap/zack blank notified of pending dc/ daughter wc significant other who will transport patient home. [employer] will get travel o2 for transport.  Final next level of care: [place] Barriers to Discharge: Barriers Resolved   Patient Goals and CMS Choice Patient states their goals for this hospitalization and ongoing recovery are:: not stated CMS Medicare.gov Compare Post Acute Care list provided to:: Legal Guardian Choice offered to / list presented to : [place] / [place]  Discharge Placement                       Discharge Plan and Services   Discharge Planning Services: CM Consult Post Acute Care Choice: Durable Medical Equipment          DME Arranged: Bipap DME Agency: [employer] Date DME Agency Contacted: 04/29/22 Time DME Agency Contacted: [phone] Representative spoke with at DME Agency: [email or web address]            Social Determinants of Health ([place]) Interventions     Readmission Risk Interventions    03/02/2020    3:30 PM 12/07/2019   10:24 AM 08/17/2019    1:07 PM  Readmission Risk Prevention Plan  Transportation Screening Complete Complete Complete  PCP or Specialist Appt within 3-5 Days  Complete   HRI or [place] Complete Complete   Social Work Consult for [place] Planning/Counseling  Complete   Palliative Care Screening Not Applicable Not Applicable   Medication Review Press photographer) Referral to Pharmacy Complete

## 2022-05-01 NOTE — Inpatient Diabetes Management (Signed)
Inpatient Diabetes Program Recommendations  AACE/ADA: New Consensus Statement on Inpatient Glycemic Control (2015)  Target Ranges:  Prepandial:   less than 140 mg/dL      Peak postprandial:   less than 180 mg/dL (1-2 hours)      Critically ill patients:  140 - 180 mg/dL   Lab Results  Component Value Date   GLUCAP 109 (H) 05/01/2022   HGBA1C 6.5 (A) 02/25/2022    Review of Glycemic Control  Latest Reference Range & Units 04/30/22 07:50 04/30/22 11:57 04/30/22 16:07 04/30/22 21:33 05/01/22 07:28  Glucose-Capillary 70 - 99 mg/dL 109 (H) 199 (H) 340 (H) 89 109 (H)   Diabetes history: DM 2 Outpatient Diabetes medications: diet controlled Current orders for Inpatient glycemic control:  Novolog 0-9 units tid  PO prednisone 40 mg Daily A1c 6.5% on 5/9  Inpatient Diabetes Program Recommendations:    Glucose trends increase after meals and steroid dose.  -  consider adding Novolog 2 units tid meal coverage if eating >50% of meals.  Thanks,  Tama Headings RN, MSN, BC-ADM Inpatient Diabetes Coordinator Team Pager [phone] (8a-5p)

## 2022-05-01 NOTE — Consult Note (Addendum)
   [facility] Inpatient Consult   05/01/2022  Jasmine [name] [birth year] [id number]  [facility] [place] [employer] [ACO] Patient: [name]  *remote coverage for review patient is at [facility]  Primary Care Provider:  Biagio Borg, MD, at [facility] is an embedded provider with a Chronic Care Management team and program, and is listed for the transition of care follow up and appointments.  Patient was noted as active with the  Embedded practice team needs for chronic care management/care coordination. Reviewed for hospital follow up needs.  Plan: Patient transitioned home with op palliative noted. Notification sent to the [name] and made aware of TOC needs for post hospital needs.  Please contact for further questions,  Natividad Brood, RN BSN [facility] Liaison  [phone] business mobile phone Toll free office [phone]  Fax number: [phone] [name]

## 2022-05-02 ENCOUNTER — Telehealth: Payer: Self-pay

## 2022-05-02 NOTE — Telephone Encounter (Signed)
Transition Care Management Unsuccessful Follow-up Telephone Call  Date of discharge and from where:  [facility] on 05/01/2022  Attempts:  1st Attempt  Reason for unsuccessful TCM follow-up call:  No answer/busy

## 2022-05-02 NOTE — Progress Notes (Signed)
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  This has been going on for the past few days, she ran out of her rescue inhaler, and felt progressively worse and called 911   Assessment & Plan:   Principal Problem:   COPD with acute exacerbation ([place]) Active Problems:   Essential hypertension   Cor pulmonale (HCC)   Acute on chronic respiratory failure with hypoxia (HCC)   Chronic diastolic congestive heart failure (HCC)   Solitary pulmonary nodule   Hypothyroidism   Diabetes mellitus due to underlying condition without complication, without long-term current use of insulin (HCC)   Acute on chronic hypercarbic and hypoxic respiratory failure due to COPD exacerbation-continue supportive care with IV steroids, nebulizers, antibiotics.  CT angiogram on admission showed mild infiltrates at the lung bases versus atelectasis.  Continue incentive spirometry, flutter valve, closely monitor.  COVID, RVP negative.  Continue supportive treatment.  Ambulate.  Incentive spirometry, flutter valve -Patient has chronic hypercarbia and used to have a BiPAP at home up until November of last year, and that was recalled.  Not clear why, but has not had another one since.  Case manager involved. Await NIV PAPERS SIGNED   Active problems Chronic diastolic CHF-continue Lasix.  Echo shows EF 50-93%, grade 1 diastolic dysfunction.  RV was normal.  Approaching euvolemia, probably can stop Lasix tomorrow   DM2-diet controlled, given sliding scale   CBG (last 3)  Recent Labs (last 2 labs)        Recent Labs    04/28/22 2031 04/29/22 0721 04/29/22 1216  GLUCAP 142* 123* 134*       Recent Labs       Lab Results  Component Value Date    HGBA1C 6.5 (A) 02/25/2022       Hyperlipidemia-continue statin  Essential hypertension-continue furosemide  Hypothyroidism-continue Synthroid   Solitary pulmonary nodule-Chronic will need to follow up with pulmonology        Nutrition Problem: Moderate Malnutrition Etiology: chronic illness (COPD)     Signs/Symptoms: mild fat depletion, mild muscle depletion, moderate muscle depletion    Interventions: Ensure Enlive (each supplement provides 350kcal and 20 grams of protein)  Estimated body mass index is 22.62 kg/m as calculated from the following:   Height as of this encounter: 5' (1.524 m).   Weight as of this encounter: 52.5 kg.  DVT prophylaxis: LOVENOX Code Status: full Family Communication: none Disposition Plan:   home  Consultants:  pccm  Procedures:none Antimicrobials:  Anti-infectives (From admission, onward)    Start     Dose/Rate Route Frequency Ordered Stop   05/01/22 0000  cefdinir (OMNICEF) 300 MG capsule        300 mg Oral 2 times daily 05/01/22 1035     04/29/22 1000  cefdinir (OMNICEF) capsule 300 mg  Status:  Discontinued        300 mg Oral Every 12 hours 04/29/22 0903 04/29/22 0904   04/29/22 1000  cefdinir (OMNICEF) capsule 300 mg  Status:  Discontinued        300 mg Oral Every 12 hours 04/29/22 0904 05/01/22 1712   04/28/22 0500  cefTRIAXone (ROCEPHIN) 1 g in sodium chloride 0.9 % 100 mL IVPB  Status:  Discontinued        1 g  200 mL/hr over 30 Minutes Intravenous Every 24 hours 04/28/22 0435 04/29/22 0903        Subjective: Resting in bed with BiPAP on awake and alert anxious to go home  Objective: Vitals:   04/30/22 2018 05/01/22 0611 05/01/22 0727 05/01/22 0732  BP: (!) 110/57 (!) 110/57    Pulse: 94 78    Resp: 18 20    Temp: 98.2 F (36.8 C) 98 F (36.7 C)    TempSrc: Oral Oral    SpO2: 96% 94% 100% 100%  Weight:      Height:       No intake or output data in the 24 hours ending 05/02/22 1650 Filed Weights   04/27/22 1759  Weight: 52.5 kg     Examination:  General exam: Appears calm and comfortable  Respiratory system: few rhonchi to auscultation. Respiratory effort normal. Cardiovascular system: S1 & S2 heard, RRR. No JVD, murmurs, rubs, gallops or clicks. No pedal edema. Gastrointestinal system: Abdomen is nondistended, soft and nontender. No organomegaly or masses felt. Normal bowel sounds heard. Central nervous system: Alert and oriented. No focal neurological deficits. Extremities: Symmetric 5 x 5 power. Skin: No rashes, lesions or ulcers Psychiatry: Judgement and insight appear normal. Mood & affect appropriate.     Data Reviewed: I have personally reviewed following labs and imaging studies  CBC: Recent Labs  Lab 04/27/22 1642 04/28/22 0500 04/29/22 0420 04/30/22 0430  WBC 6.4 4.2 6.8 9.1  NEUTROABS 3.9  --   --   --   HGB 13.3 12.3 11.5* 11.4*  HCT 44.8 41.7 37.8 36.8  MCV 103.2* 104.5* 99.7 99.5  PLT 149* 148* 152 027   Basic Metabolic Panel: Recent Labs  Lab 04/27/22 1642 04/28/22 1355 04/28/22 1356 04/29/22 0420 04/30/22 0430  NA 142  --  137 143 145  K 4.0  --  4.2 4.4 3.1*  CL 91*  --  89* 88* 88*  CO2 42*  --  39* 45* >45*  GLUCOSE 103*  --  120* 152* 106*  BUN 14  --  16 21 27*  CREATININE 0.68  --  0.62 0.72 0.81  CALCIUM 9.7  --  9.4 9.5 9.5  MG  --  1.6*  --  1.6* 2.1  PHOS  --  3.4  --   --   --    GFR: Estimated Creatinine Clearance: 41.8 mL/min (by C-G formula based on SCr of 0.81 mg/dL). Liver Function Tests: Recent Labs  Lab 04/27/22 1642 04/28/22 1356 04/29/22 0420 04/30/22 0430  AST 34 '27 25 21  '$ ALT '22 20 17 17  '$ ALKPHOS 57 52 46 47  BILITOT 1.0 0.7 0.7 0.6  PROT 7.9 6.9 6.5 6.2*  ALBUMIN 4.2 3.7 3.4* 3.5   No results for input(s): "LIPASE", "AMYLASE" in the last 168 hours. No results for input(s): "AMMONIA" in the last 168 hours. Coagulation Profile: No results for input(s): "INR", "PROTIME" in the last 168 hours. Cardiac Enzymes: Recent Labs  Lab  04/28/22 1355  CKTOTAL 77   BNP (last 3 results) No results for input(s): "PROBNP" in the last 8760 hours. HbA1C: No results for input(s): "HGBA1C" in the last 72 hours. CBG: Recent Labs  Lab 04/30/22 0750 04/30/22 1157 04/30/22 1607 04/30/22 2133 05/01/22 0728  GLUCAP 109* 199* 340* 89 109*   Lipid Profile: No results for input(s): "CHOL", "HDL", "LDLCALC", "TRIG", "CHOLHDL", "LDLDIRECT" in the last 72 hours. Thyroid Function Tests: No results for input(s): "TSH", "T4TOTAL", "FREET4", "T3FREE", "THYROIDAB" in  the last 72 hours. Anemia Panel: No results for input(s): "VITAMINB12", "FOLATE", "FERRITIN", "TIBC", "IRON", "RETICCTPCT" in the last 72 hours. Sepsis Labs: No results for input(s): "PROCALCITON", "LATICACIDVEN" in the last 168 hours.  Recent Results (from the past 240 hour(s))  SARS Coronavirus 2 by RT PCR (hospital order, performed in [facility] hospital lab) *cepheid single result test* Anterior Nasal Swab     Status: None   Collection Time: 04/28/22 12:35 AM   Specimen: Anterior Nasal Swab  Result Value Ref Range Status   SARS Coronavirus 2 by RT PCR NEGATIVE NEGATIVE Final    Comment: (NOTE) SARS-CoV-2 target nucleic acids are NOT DETECTED.  The SARS-CoV-2 RNA is generally detectable in upper and lower respiratory specimens during the acute phase of infection. The lowest concentration of SARS-CoV-2 viral copies this assay can detect is 250 copies / mL. A negative result does not preclude SARS-CoV-2 infection and should not be used as the sole basis for treatment or other patient management decisions.  A negative result may occur with improper specimen collection / handling, submission of specimen other than nasopharyngeal swab, presence of viral mutation(s) within the areas targeted by this assay, and inadequate number of viral copies (<250 copies / mL). A negative result must be combined with clinical observations, patient history, and epidemiological  information.  Fact Sheet for Patients:   https://www.patel.info/  Fact Sheet for Healthcare Providers: https://hall.com/  This test is not yet approved or  cleared by the [place] FDA and has been authorized for detection and/or diagnosis of SARS-CoV-2 by FDA under an Emergency Use Authorization (EUA).  This EUA will remain in effect (meaning this test can be used) for the duration of the COVID-19 declaration under Section 564(b)(1) of the Act, 21 U.S.C. section 360bbb-3(b)(1), unless the authorization is terminated or revoked sooner.  Performed at [facility], [place] [address] [place], [place] [address]   Respiratory (~20 pathogens) panel by PCR     Status: None   Collection Time: 04/28/22  4:36 AM   Specimen: Nasopharyngeal Swab; Respiratory  Result Value Ref Range Status   Adenovirus NOT DETECTED NOT DETECTED Final   Coronavirus 229E NOT DETECTED NOT DETECTED Final    Comment: (NOTE) The Coronavirus on the Respiratory Panel, DOES NOT test for the novel  Coronavirus (2019 nCoV)    Coronavirus HKU1 NOT DETECTED NOT DETECTED Final   Coronavirus NL63 NOT DETECTED NOT DETECTED Final   Coronavirus OC43 NOT DETECTED NOT DETECTED Final   Metapneumovirus NOT DETECTED NOT DETECTED Final   Rhinovirus / Enterovirus NOT DETECTED NOT DETECTED Final   Influenza A NOT DETECTED NOT DETECTED Final   Influenza B NOT DETECTED NOT DETECTED Final   Parainfluenza Virus 1 NOT DETECTED NOT DETECTED Final   Parainfluenza Virus 2 NOT DETECTED NOT DETECTED Final   Parainfluenza Virus 3 NOT DETECTED NOT DETECTED Final   Parainfluenza Virus 4 NOT DETECTED NOT DETECTED Final   Respiratory Syncytial Virus NOT DETECTED NOT DETECTED Final   Bordetella pertussis NOT DETECTED NOT DETECTED Final   Bordetella Parapertussis NOT DETECTED NOT DETECTED Final   Chlamydophila pneumoniae NOT DETECTED NOT DETECTED Final   Mycoplasma pneumoniae NOT  DETECTED NOT DETECTED Final    Comment: Performed at [facility], [place]. [address] [place], [place] [address]  MRSA Next Gen by PCR, Nasal     Status: None   Collection Time: 04/28/22  4:36 AM   Specimen: Nasopharyngeal Swab; Nasal Swab  Result Value Ref Range Status  MRSA by PCR Next Gen NOT DETECTED NOT DETECTED Final    Comment: (NOTE) The GeneXpert MRSA Assay (FDA approved for NASAL specimens only), is one component of a comprehensive MRSA colonization surveillance program. It is not intended to diagnose MRSA infection nor to guide or monitor treatment for MRSA infections. Test performance is not FDA approved in patients less than 58 years old. Performed at [facility], [place] [address] [place], [place] [address]          Radiology Studies: No results found.      Scheduled Meds: Continuous Infusions:   LOS: 4 days    Time spent: 37 min  Georgette Shell, MD 05/02/2022, 4:50 PM

## 2022-05-02 NOTE — Telephone Encounter (Signed)
Spoke with patient and scheduled a telephonic Palliative Consult for 05/12/22 @ 1:30 PM.   Consent obtained; updated Netsmart, Team List and Epic.

## 2022-05-12 ENCOUNTER — Other Ambulatory Visit: Payer: Medicare HMO | Admitting: Internal Medicine

## 2022-05-12 DIAGNOSIS — F419 Anxiety disorder, unspecified: Secondary | ICD-10-CM

## 2022-05-12 DIAGNOSIS — E440 Moderate protein-calorie malnutrition: Secondary | ICD-10-CM

## 2022-05-12 DIAGNOSIS — J441 Chronic obstructive pulmonary disease with (acute) exacerbation: Secondary | ICD-10-CM

## 2022-05-12 DIAGNOSIS — R0689 Other abnormalities of breathing: Secondary | ICD-10-CM

## 2022-05-12 DIAGNOSIS — Z515 Encounter for palliative care: Secondary | ICD-10-CM

## 2022-05-12 NOTE — Progress Notes (Signed)
[facility] Consult Note Telephone: [phone]  Fax: [phone]   Date of encounter: 05/12/22 9:44 AM PATIENT NAME: Jasmine [name] [address] [place] [address]   [phone] (home)  DOB: January 13, 1946 MRN: [id number] PRIMARY CARE PROVIDER:    Biagio Borg, MD,  [place] [address] [phone]  REFERRING PROVIDER:   Biagio Borg, MD [address] [place],  [place] [address] [phone]  RESPONSIBLE PARTY:    Contact Information     Name Relation Home Work Mobile   Smith,Donald Significant other   [phone]   Willene Hatchet Niece   [phone]   Irven Easterly Niece [phone]          Due to the COVID-19 crisis, this visit was done via telemedicine from my office and it was initiated and consent by this patient and or family.  I connected with  [place] PROXY on 05/12/22 by a telemedicine application and verified that I am speaking with the correct person using two identifiers.  This patient did not have the ability to connect via a video-enabled capacity.   I discussed the limitations of evaluation and management by telemedicine. The patient expressed understanding and agreed to proceed.  Palliative Care was asked to follow this patient by consultation request of  Biagio Borg, MD to address advance care planning and complex medical decision making. This is the initial visit.                                     ASSESSMENT AND PLAN / RECOMMENDATIONS:   Advance Care Planning/Goals of Care: Goals include to maximize quality of life and symptom management. Patient/health care surrogate gave his/her permission to discuss.Our advance care planning conversation included a discussion about:    The value and importance of advance care planning  Experiences with loved ones who have been seriously ill or have died  Exploration of personal, cultural or spiritual beliefs that might  influence medical decisions  Exploration of goals of care in the event of a sudden injury or illness  Identification  of a healthcare agent--has living will and HCPOA in her safety deposit box.  Smith,Donald (Significant other)  [phone] (Mobile) her ex-husband is her HCPOA. Backups are her two nieces.  She thinks she needs to go back to her attorney to get that straight.   Review and updating or creation of an  advance directive document . Decision not to resuscitate or to de-escalate disease focused treatments due to poor prognosis. CODE STATUS:  FULL CODE unless she's brain dead already--requests CPR, and would want to be put on a ventilator until it's main known that she does not have brain function and that [name]  She likes to pass on the word of God and piece of mind to others.    Symptom Management/Plan:  Weight loss/protein cal mal:  Small frequent meals  SW to help educate about CNA options for her, [employer] benefits   Follow up Palliative Care Visit: Palliative care will continue to follow for complex medical decision making, advance care planning, and clarification of goals. Return 07/01/2022 In-person.     PPS: 50%  HOSPICE ELIGIBILITY/DIAGNOSIS: end stage copd/not at this time  Chief Complaint: Initial palliative telephone consult  HISTORY OF PRESENT ILLNESS:  Jasmine [name] is a 76 y.o. year old female  with end stage COPD, chronic respiratory  failure with hypoxia on 4L O2, DMII, osteoporosis, hypothyroidism, solitary pulmonary nodule, chronic diastolic heart failure, and hyperlipidemia spoken with for initial phone palliative consult after recent hospitalization from 7/9-14 with COPD exacerbation with possible pneumonia.  She also was noted to have chronic hypercarbia and outpatient sleep study recommended.  She also was to f/u with pulmonary due to her nodule.  Ensure enlive was recommended for protein cal mal.  She is doing pretty good and trying to escape all of the phone  calls.    Her faith is very strong regardless of what she's going through around her.    She knows she is not quite at hospice.    She knows there is no cure for COPD.  She has good and bad days.  She tries to stay positive and not let her bad days ruin her.  She has some really challenging ones.  This morning getting changed from "switching from ventilator? to the concentrator, she could not get the tubing detached."  She had to wait on her significant other to pull it apart.  It took a while to get her breath after that.  She knows her mind impacts it a lot and she can overcome more challenges if she takes deep breaths.  She relies on her bible when she's challenged.    Her appetite is good now, but for a while before she went to the hospital, she did not want too much of anything to eat.  She was just drinking juices.  She is staying hungry all of the time.  Her weight is down--she'd been up close to 130 lbs but down to 114 lbs today (hasn't eaten yet today).    She doesn't use a walker or cane and can go from one room to the other pretty well w/o dyspnea.  She is using the shower with a shower chair and grab bars (not up yet).  Has been doing a birdbath so far.  She would benefit from having a CNA.  She's still trying to do laundry and paid some people to do a few things for her like cleaning.  She has military help, but doesn't know how to access this.  [employer] and [employer].  They hope to get a walk-in shower or tub.    History obtained from review of EMR, discussion with primary team, and interview with family, facility staff/caregiver and/or Ms. Dismuke.   I reviewed available labs, medications, imaging, studies and related documents from the EMR.  Records reviewed and summarized above.   ROS  Review of Systemssee hpi  Physical Exam:  Wt Readings from Last 500 Encounters:  04/27/22 115 lb 12.8 oz (52.5 kg)  03/05/22 115 lb (52.2 kg)  02/25/22 116 lb 12.8 oz (53 kg)  02/25/22 116  lb 12.8 oz (53 kg)  12/05/21 120 lb (54.4 kg)  08/01/21 124 lb 12.5 oz (56.6 kg)  07/22/21 130 lb (59 kg)  04/19/21 130 lb (59 kg)  12/20/20 124 lb 12.8 oz (56.6 kg)  11/22/20 126 lb 6.4 oz (57.3 kg)  08/14/20 120 lb 3.2 oz (54.5 kg)  06/11/20 109 lb (49.4 kg)  06/08/20 109 lb (49.4 kg)  04/19/20 110 lb (49.9 kg)  03/29/20 119 lb (54 kg)  03/27/20 111 lb 6.4 oz (50.5 kg)  03/20/20 111 lb 12.8 oz (50.7 kg)  03/16/20 115 lb (52.2 kg)  03/09/20 113 lb (51.3 kg)  03/01/20 117 lb 8.1 oz (53.3 kg)  02/13/20 116 lb (52.6 kg)  02/01/20 118 lb 2.7 oz (53.6 kg)  12/14/19 115 lb 3.2 oz (52.3 kg)  12/07/19 130 lb (59 kg)  09/08/19 118 lb 12.8 oz (53.9 kg)  08/30/19 120 lb (54.4 kg)  08/15/19 117 lb 8.1 oz (53.3 kg)  03/28/19 117 lb (53.1 kg)  03/07/19 119 lb 7.8 oz (54.2 kg)  12/28/18 116 lb (52.6 kg)  12/16/18 114 lb 8 oz (51.9 kg)  11/24/18 117 lb (53.1 kg)  11/16/18 114 lb (51.7 kg)  09/20/18 115 lb (52.2 kg)  09/07/18 115 lb (52.2 kg)  09/06/18 114 lb 3.2 oz (51.8 kg)  07/08/18 108 lb (49 kg)  07/06/18 108 lb 6.4 oz (49.2 kg)  06/16/18 108 lb (49 kg)  05/27/18 115 lb 8.3 oz (52.4 kg)  05/04/18 111 lb 6.4 oz (50.5 kg)  04/30/18 106 lb (48.1 kg)  04/28/18 104 lb 3.2 oz (47.3 kg)  04/21/18 114 lb 3.2 oz (51.8 kg)  04/15/18 106 lb 11.2 oz (48.4 kg)  04/14/18 108 lb (49 kg)  04/02/18 120 lb 13 oz (54.8 kg)  03/24/18 120 lb 9.5 oz (54.7 kg)  03/22/18 107 lb 9.4 oz (48.8 kg)  02/18/18 109 lb (49.4 kg)  01/26/18 112 lb 6.4 oz (51 kg)  01/08/18 108 lb 3.9 oz (49.1 kg)  12/17/17 111 lb 6.4 oz (50.5 kg)  09/15/17 111 lb 14.4 oz (50.8 kg)  08/31/17 109 lb (49.4 kg)  06/16/17 108 lb 12.8 oz (49.4 kg)  06/03/17 108 lb (49 kg)  02/23/17 110 lb (49.9 kg)  11/11/16 112 lb 12.8 oz (51.2 kg)  08/18/16 118 lb (53.5 kg)  08/13/16 116 lb 4.8 oz (52.8 kg)  07/10/16 116 lb (52.6 kg)  06/26/16 110 lb 12.8 oz (50.3 kg)  06/05/16 114 lb (51.7 kg)  06/05/16 112 lb 9.6 oz (51.1 kg)  05/22/16  110 lb 10.7 oz (50.2 kg)  05/14/16 110 lb (49.9 kg)  05/08/16 110 lb 6.4 oz (50.1 kg)  05/08/16 110 lb 6.4 oz (50.1 kg)  04/18/16 109 lb 1.6 oz (49.5 kg)  04/09/16 109 lb (49.4 kg)  03/27/16 101 lb 4.8 oz (45.9 kg)  01/16/16 95 lb (43.1 kg)  01/15/16 96 lb (43.5 kg)  01/08/16 90 lb 3.2 oz (40.9 kg)  12/28/15 89 lb 15.2 oz (40.8 kg)  12/27/15 87 lb 12.8 oz (39.8 kg)  10/09/15 92 lb (41.7 kg)  01/11/15 90 lb 6.4 oz (41 kg)  11/30/14 93 lb (42.2 kg)  09/26/14 89 lb (40.4 kg)  08/30/14 88 lb 6.4 oz (40.1 kg)  07/10/14 91 lb (41.3 kg)  06/21/14 97 lb 3.6 oz (44.1 kg)  08/12/13 97 lb (44 kg)  05/02/13 92 lb 12.8 oz (42.1 kg)  03/02/13 97 lb 6.4 oz (44.2 kg)  02/03/13 107 lb 3.2 oz (48.6 kg)  01/12/13 103 lb 8 oz (46.9 kg)  08/18/12 101 lb (45.8 kg)  11/27/11 107 lb 6 oz (48.7 kg)  11/09/11 113 lb 14.4 oz (51.7 kg)  08/11/11 117 lb 4 oz (53.2 kg)  03/09/07 115 lb (52.2 kg)    CURRENT PROBLEM LIST:  Patient Active Problem List   Diagnosis Date Noted   Vitamin D deficiency 03/01/2022   Physical deconditioning 12/05/2021   COPD exacerbation ([place]) 07/30/2021   Diabetes mellitus due to underlying condition without complication, without long-term current use of insulin ([place]) 07/22/2021   Steroid-dependent chronic obstructive pulmonary disease ([place]) 07/22/2021   Postablative hypothyroidism 07/22/2021   Encounter for well adult exam with abnormal findings 04/20/2021  Malnutrition of moderate degree 08/16/2019   Respiratory acidosis 03/07/2019   Goals of care, counseling/discussion 09/20/2018   Edema due to congestive heart failure ([place]) 07/27/2018   Palliative care by specialist    Shortness of breath    Chronic low back pain 04/30/2018   COPD with acute exacerbation ([place])    Hypothyroidism    Anxiety    Respiratory failure with hypercapnia ([place]) 03/24/2018   Hypercarbia 03/22/2018   Polysubstance abuse ([place]) 01/26/2018   Aspiration into airway 06/03/2017   Rash 07/12/2016    Bilateral hearing loss 06/05/2016   Hyperglycemia 05/21/2016   Abnormal LFTs 03/24/2016   Solitary pulmonary nodule 01/09/2016   Acute on chronic respiratory failure with hypoxia (HCC)    On home oxygen therapy    Chronic diastolic congestive heart failure ([place])    Bacteremia Step viridans 12/27/2015   Acute on chronic respiratory failure ([place]) 12/25/2015   Former smoker 12/25/2015   Hypothyroidism following radioiodine therapy 09/26/2014   Protein-calorie malnutrition, severe ([place]) 06/23/2014   Cor pulmonale ([place]) 01/12/2013   COPD (chronic obstructive pulmonary disease) ([place]) 11/29/2011   Macrocytosis without anemia 11/09/2011   Palliative care encounter 04/09/2011   SINUSITIS, CHRONIC 10/10/2010   Fatigue 10/10/2010   Depression with anxiety 07/21/2008   Essential hypertension 05/01/2007   ALLERGIC RHINITIS 05/01/2007   Asthma 05/01/2007   OSTEOPOROSIS 05/01/2007   PAST MEDICAL HISTORY:  Active Ambulatory Problems    Diagnosis Date Noted   Depression with anxiety 07/21/2008   Essential hypertension 05/01/2007   ALLERGIC RHINITIS 05/01/2007   Asthma 05/01/2007   OSTEOPOROSIS 05/01/2007   SINUSITIS, CHRONIC 10/10/2010   Fatigue 10/10/2010   Palliative care encounter 04/09/2011   Macrocytosis without anemia 11/09/2011   COPD (chronic obstructive pulmonary disease) ([place]) 11/29/2011   Cor pulmonale ([place]) 01/12/2013   Protein-calorie malnutrition, severe ([place]) 06/23/2014   Hypothyroidism following radioiodine therapy 09/26/2014   Acute on chronic respiratory failure ([place]) 12/25/2015   Former smoker 12/25/2015   Bacteremia Step viridans 12/27/2015   Acute on chronic respiratory failure with hypoxia ([place])    On home oxygen therapy    Chronic diastolic congestive heart failure ([place])    Solitary pulmonary nodule 01/09/2016   Abnormal LFTs 03/24/2016   Hyperglycemia 05/21/2016   Bilateral hearing loss 06/05/2016   Rash 07/12/2016   Aspiration into airway 06/03/2017    Polysubstance abuse ([place]) 01/26/2018   Hypercarbia 03/22/2018   Respiratory failure with hypercapnia ([place]) 03/24/2018   COPD with acute exacerbation (HCC)    Hypothyroidism    Anxiety    Chronic low back pain 04/30/2018   Palliative care by specialist    Shortness of breath    Edema due to congestive heart failure ([place]) 07/27/2018   Goals of care, counseling/discussion 09/20/2018   Respiratory acidosis 03/07/2019   Malnutrition of moderate degree 08/16/2019   Encounter for well adult exam with abnormal findings 04/20/2021   Diabetes mellitus due to underlying condition without complication, without long-term current use of insulin ([place]) 07/22/2021   Steroid-dependent chronic obstructive pulmonary disease ([place]) 07/22/2021   Postablative hypothyroidism 07/22/2021   COPD exacerbation ([place]) 07/30/2021   Physical deconditioning 12/05/2021   Vitamin D deficiency 03/01/2022   Resolved Ambulatory Problems    Diagnosis Date Noted   Acute sinus infection 10/10/2008   SHOULDER PAIN, LEFT 08/23/2009   LUMBAR RADICULOPATHY, RIGHT 08/25/2008   BACK PAIN 10/10/2008   Vaginitis 08/11/2011   Back pain 08/11/2011   Bronchitis 11/09/2011   Abnormal CXR 11/09/2011   Acute  respiratory failure with hypoxia ([place]) 11/09/2011   Hyponatremia 11/09/2011   COPD exacerbation ([place]) 11/27/2011   Smoker 01/12/2013   Hypercarbia 06/20/2014   Acute on chronic respiratory failure with hypercapnia ([place]) 11/30/2014   CHF exacerbation ([place]) 12/24/2015   Hypomagnesemia 12/25/2015   Hypothyroidism 12/27/2015   Staphylococcus aureus bacteremia 12/28/2015   Viridans streptococci infection 12/28/2015   CHF (congestive heart failure) ([place]) 03/24/2016   Chest pain 03/24/2016   Lung mass 03/24/2016   Elevated blood sugar 03/24/2016   Acute on chronic respiratory failure with hypoxemia (HCC)    Bilateral pleural effusion    HCAP (healthcare-associated pneumonia) 05/21/2016   Insect bite 07/12/2016   Acute on  chronic respiratory failure with hypoxia and hypercapnia (HCC) 01/07/2018   Cerumen impaction 02/18/2018   Dehydration 03/22/2018   Acute on chronic respiratory failure with hypercapnia ([place]) 03/23/2018   Acute encephalopathy 03/27/2018   Acute respiratory failure with hypoxia and hypercapnia (HCC)    Respiratory failure ([place]) 12/16/2018   Acute respiratory failure with hypoxia and hypercarbia (HCC) 03/07/2019   Type II respiratory failure ([place]) 08/14/2019   Acute on chronic respiratory failure with hypercapnia ([place]) 01/29/2020   Past Medical History:  Diagnosis Date   Arthritis    ASTHMA 05/01/2007   Cancer ([place])    Chronic bronchitis ([place])    DEPRESSION 07/21/2008   DVT (deep venous thrombosis) ([place]) 1960s   FATIGUE 10/10/2010   HYPERTENSION 05/01/2007   HYPOTHYROIDISM 08/23/2009   SOCIAL HX:  Social History   Tobacco Use   Smoking status: Former    Packs/day: 1.00    Years: 52.00    Total pack years: 52.00    Types: Cigarettes    Start date: 1965    Quit date: 07/20/2016    Years since quitting: 5.8   Smokeless tobacco: Never  Substance Use Topics   Alcohol use: Not Currently   FAMILY HX:  Family History  Problem Relation Age of Onset   Lung cancer Father    Alcohol abuse Brother    Diabetes Brother    Hypertension Other    Stroke Mother    Thyroid disease Mother    Asthma Other        maternal aunts   Breast cancer Neg Hx       ALLERGIES:  Allergies  Allergen Reactions   Alendronate Sodium Other (See Comments)    Patient does not remember this reaction      PERTINENT MEDICATIONS:  Outpatient Encounter Medications as of 05/12/2022  Medication Sig   acetaminophen (TYLENOL) 325 MG tablet Take 2 tablets (650 mg total) by mouth every 6 (six) hours as needed for mild pain (or Fever >/= 101).   albuterol (VENTOLIN HFA) 108 (90 Base) MCG/ACT inhaler INHALE 1 TO 2 PUFFS INTO THE LUNGS EVERY 6 HOURS AS NEEDED FOR WHEEZING OR SHORTNESS OF BREATH   aspirin EC 81 MG  EC tablet Take 1 tablet (81 mg total) by mouth daily.   atorvastatin (LIPITOR) 10 MG tablet TAKE 1 TABLET BY MOUTH DAILY. (Patient taking differently: Take 10 mg by mouth daily.)   cefdinir (OMNICEF) 300 MG capsule Take 1 capsule (300 mg total) by mouth 2 (two) times daily.   citalopram (CELEXA) 20 MG tablet TAKE ONE TABLET BY MOUTH DAILY (Patient taking differently: Take 20 mg by mouth daily. TAKE ONE TABLET BY MOUTH DAILY)   fluticasone (FLONASE) 50 MCG/ACT nasal spray Place 1 spray into both nostrils daily.   Fluticasone-Umeclidin-Vilant (TRELEGY ELLIPTA) 200-62.5-25 MCG/INH AEPB Inhale  1 puff into the lungs daily.   furosemide (LASIX) 20 MG tablet Take 1 tablet (20 mg total) by mouth daily.   guaiFENesin (ROBITUSSIN) 100 MG/5ML liquid Take 5 mLs by mouth every 4 (four) hours as needed for cough or to loosen phlegm. Mucinex chest congestion (liquid)   ipratropium-albuterol (DUONEB) 0.5-2.5 (3) MG/3ML SOLN Take 3 mLs by nebulization every 6 (six) hours as needed. (Patient taking differently: Take 3 mLs by nebulization every 6 (six) hours as needed (for shortness of breath).)   levothyroxine (SYNTHROID) 75 MCG tablet Take 1 tablet (75 mcg total) by mouth daily before breakfast. TAKE ONE TABLET BY MOUTH EARLY MORNING   loratadine (CLARITIN) 10 MG tablet Take 1 tablet (10 mg total) by mouth daily.   OXYGEN Inhale 3 L into the lungs continuous. 24/7   potassium chloride (KLOR-CON M) 10 MEQ tablet Take 1 tablet (10 mEq total) by mouth daily.   No facility-administered encounter medications on file as of 05/12/2022.    Thank you for the opportunity to participate in the care of Ms. Toren.  The palliative care team will continue to follow. Please call our office at [phone] if we can be of additional assistance.   Hollace Kinnier, DO  COVID-19 PATIENT SCREENING TOOL Asked and negative response unless otherwise noted:  Have you had symptoms of covid, tested positive or been in contact with someone  with symptoms/positive test in the past 5-10 days?  NO

## 2022-05-13 ENCOUNTER — Telehealth: Payer: Self-pay | Admitting: *Deleted

## 2022-05-13 ENCOUNTER — Ambulatory Visit (INDEPENDENT_AMBULATORY_CARE_PROVIDER_SITE_OTHER): Payer: Medicare HMO | Admitting: Adult Health

## 2022-05-13 ENCOUNTER — Encounter: Payer: Self-pay | Admitting: Adult Health

## 2022-05-13 DIAGNOSIS — J432 Centrilobular emphysema: Secondary | ICD-10-CM | POA: Diagnosis not present

## 2022-05-13 DIAGNOSIS — R5381 Other malaise: Secondary | ICD-10-CM

## 2022-05-13 DIAGNOSIS — I5032 Chronic diastolic (congestive) heart failure: Secondary | ICD-10-CM | POA: Diagnosis not present

## 2022-05-13 DIAGNOSIS — J9621 Acute and chronic respiratory failure with hypoxia: Secondary | ICD-10-CM

## 2022-05-13 DIAGNOSIS — E43 Unspecified severe protein-calorie malnutrition: Secondary | ICD-10-CM

## 2022-05-13 MED ORDER — PREDNISONE 5 MG PO TABS: 5.0000 mg | ORAL_TABLET | Freq: Every day | ORAL | 1 refills | Status: DC

## 2022-05-13 NOTE — Patient Instructions (Addendum)
Continue on Trelegy 1 puff daily.  May use Duoneb nebs every 6hr As needed  Wheezing /shortness of breath .  Continue on Oxygen 3l/m rest and 4l/m with activity .  Restart Prednisone '5mg'$  daily.  Wear Trilogy noninvasive vent each night and with naps.  Follow up Dr. Elsworth Soho  In 6 weeks  and As needed   Please contact office for sooner follow up if symptoms do not improve or worsen or seek emergency care

## 2022-05-13 NOTE — Progress Notes (Signed)
$'@Patient'T$  ID: Jasmine [name], female    DOB: [birth year], 76 y.o.   MRN: [id number]  Chief Complaint  Patient presents with   Hospitalization Follow-up    Referring provider: Biagio Borg, MD  HPI: 76 year old female former smoker followed for very severe COPD, chronic hypoxic and hypercarbic respiratory failure on oxygen.  Known history of lung nodules.  Patient is prone to recurrent COPD exacerbations.  Maintained on oxygen since March 2014. History of polysubstance abuse.  With cocaine  TEST/EVENTS :  CT chest 10/2011 >> showed Moderate centrilobular emphysema. Small bilateral pleural effusions with dependent atelectasis   CT chest 07/2016-resolving right upper lobe consolidation, spiculated subcentimeter nodules that have been noted before with low-grade PET hypermetabolism CT chest February 2019 stable right upper lobe scarring, stable bilateral pulmonary nodules, emphysema    CT chest  11/2018>> stable nodules, unchanged bandlike scarring in the inferior right upper lobe stable  Stable small pulmonary nodules, likely benign sequelae of prior infection. No further routine CT follow-up is required for these Nodules.    PFT  >FEV1 at 0.47 L( 30%), ratio 39 , No BD response (<200cc)   05/13/2022 Follow up : COPD and O2 RF  Patient returns for a posthospital follow-up.  Patient was recently hospitalized for COPD exacerbation.  She was treated with empiric IV antibiotics, steroids and nebulized bronchodilators.  CT chest was negative for PE.  Mild atelectasis in the lung bases.  Stable bilateral pulmonary nodules largest measuring 1 cm, emphysema. Discharged on [place]. She is feeling better with decreased cough/congestion  Was previously on Trilogy Device previously but did not wear and sent back to DME in 2022.  At discharge was recommended on NIV at bedtime. Mailed to her last week. Wears each night for ~7 hrs. She is following with [facility]. On Trilogy V PRO NIV . She feels it  is helping .  Palliative care is following at home Was previously on prednisone '10mg'$  since 04/2020 . Says not been taking daily for few weeks. Has been getting short courses of prednisone.  Remains on Oxygen 3l/m rest and 4l/m with activity .     Allergies  Allergen Reactions   Alendronate Sodium Other (See Comments)    Patient does not remember this reaction    Immunization History  Administered Date(s) Administered   Fluad Quad(high Dose 65+) 08/30/2019, 08/14/2020, 08/01/2021   Influenza Split 07/10/2011, 08/18/2012   Influenza Whole 07/21/2008, 08/23/2009, 10/10/2010   Influenza, High Dose Seasonal PF 07/10/2016, 09/03/2017   Influenza,inj,Quad PF,6+ Mos 08/12/2013, 07/10/2014, 07/08/2018   PFIZER(Purple Top)SARS-COV-2 Vaccination 12/24/2019, 01/14/2020, 11/22/2020   Pneumococcal Conjugate-13 11/30/2014   Pneumococcal Polysaccharide-23 07/21/2007, 01/12/2013, 06/16/2018   Td 08/23/2009   Zoster, Live 07/20/2006    Past Medical History:  Diagnosis Date   ALLERGIC RHINITIS 05/01/2007   Anxiety    Arthritis    "hands" (12/28/2015)   ASTHMA 05/01/2007   "since I was a child"   Cancer ([place])    LUNG   CHF (congestive heart failure) ([place])    Chronic bronchitis ([place])    COPD (chronic obstructive pulmonary disease) ([place])    DEPRESSION 07/21/2008   DVT (deep venous thrombosis) ([place]) 1960s   LLE   FATIGUE 10/10/2010   HYPERTENSION 05/01/2007   HYPOTHYROIDISM 08/23/2009   s/p RAI   LUMBAR RADICULOPATHY, RIGHT 08/25/2008   On home oxygen therapy    "3-4L; qd; all the time" (12/28/2015)   OSTEOPOROSIS 05/01/2007   SHOULDER PAIN, LEFT 08/23/2009   SINUSITIS, CHRONIC  10/10/2010    Tobacco History: Social History   Tobacco Use  Smoking Status Former   Packs/day: 1.00   Years: 52.00   Total pack years: 52.00   Types: Cigarettes   Start date: 1965   Quit date: 07/20/2016   Years since quitting: 5.8  Smokeless Tobacco Never   Counseling given: Not Answered   Outpatient  Medications Prior to Visit  Medication Sig Dispense Refill   acetaminophen (TYLENOL) 325 MG tablet Take 2 tablets (650 mg total) by mouth every 6 (six) hours as needed for mild pain (or Fever >/= 101).     albuterol (VENTOLIN HFA) 108 (90 Base) MCG/ACT inhaler INHALE 1 TO 2 PUFFS INTO THE LUNGS EVERY 6 HOURS AS NEEDED FOR WHEEZING OR SHORTNESS OF BREATH 18 g 4   aspirin EC 81 MG EC tablet Take 1 tablet (81 mg total) by mouth daily. 30 tablet 0   atorvastatin (LIPITOR) 10 MG tablet TAKE 1 TABLET BY MOUTH DAILY. (Patient taking differently: Take 10 mg by mouth daily.) 90 tablet 1   cefdinir (OMNICEF) 300 MG capsule Take 1 capsule (300 mg total) by mouth 2 (two) times daily. 4 capsule 0   citalopram (CELEXA) 20 MG tablet TAKE ONE TABLET BY MOUTH DAILY (Patient taking differently: Take 20 mg by mouth daily. TAKE ONE TABLET BY MOUTH DAILY) 90 tablet 3   fluticasone (FLONASE) 50 MCG/ACT nasal spray Place 1 spray into both nostrils daily. 1 mL 2   Fluticasone-Umeclidin-Vilant (TRELEGY ELLIPTA) 200-62.5-25 MCG/INH AEPB Inhale 1 puff into the lungs daily. 60 each 11   furosemide (LASIX) 20 MG tablet Take 1 tablet (20 mg total) by mouth daily. 30 tablet 11   guaiFENesin (ROBITUSSIN) 100 MG/5ML liquid Take 5 mLs by mouth every 4 (four) hours as needed for cough or to loosen phlegm. Mucinex chest congestion (liquid)     ipratropium-albuterol (DUONEB) 0.5-2.5 (3) MG/3ML SOLN Take 3 mLs by nebulization every 6 (six) hours as needed. (Patient taking differently: Take 3 mLs by nebulization every 6 (six) hours as needed (for shortness of breath).) 360 mL 11   levothyroxine (SYNTHROID) 75 MCG tablet Take 1 tablet (75 mcg total) by mouth daily before breakfast. TAKE ONE TABLET BY MOUTH EARLY MORNING 90 tablet 3   loratadine (CLARITIN) 10 MG tablet Take 1 tablet (10 mg total) by mouth daily.     OXYGEN Inhale 3 L into the lungs continuous. 24/7     potassium chloride (KLOR-CON M) 10 MEQ tablet Take 1 tablet (10 mEq  total) by mouth daily. 30 tablet 6   No facility-administered medications prior to visit.     Review of Systems:   Constitutional:   No  weight loss, night sweats,  Fevers, chills,  +fatigue, or  lassitude.  HEENT:   No headaches,  Difficulty swallowing,  Tooth/dental problems, or  Sore throat,                No sneezing, itching, ear ache, nasal congestion, post nasal drip,   CV:  No chest pain,  Orthopnea, PND, swelling in lower extremities, anasarca, dizziness, palpitations, syncope.   GI  No heartburn, indigestion, abdominal pain, nausea, vomiting, diarrhea, change in bowel habits, loss of appetite, bloody stools.   Resp:   No chest wall deformity  Skin: no rash or lesions.  GU: no dysuria, change in color of urine, no urgency or frequency.  No flank pain, no hematuria   MS:  No joint pain or swelling.  No decreased range  of motion.  No back pain.    Physical Exam  BP (!) 94/52 (BP Location: Right Arm, Patient Position: Sitting, Cuff Size: Normal)   Pulse 88   Temp 98.4 F (36.9 C) (Oral)   Ht 5' (1.524 m)   Wt 115 lb 12.8 oz (52.5 kg)   SpO2 94%   BMI 22.62 kg/m   GEN: A/Ox3; pleasant , NAD, chronically ill appearing , on O2    HEENT:  [place]/AT,   NOSE-clear, THROAT-clear, no lesions, no postnasal drip or exudate noted.   NECK:  Supple w/ fair ROM; no JVD; normal carotid impulses w/o bruits; no thyromegaly or nodules palpated; no lymphadenopathy.    RESP  Clear  P & A; w/o, wheezes/ rales/ or rhonchi. no accessory muscle use, no dullness to percussion  CARD:  RRR, no m/r/g, 1+ peripheral edema, pulses intact, no cyanosis or clubbing.  GI:   Soft & nt; nml bowel sounds; no organomegaly or masses detected.   Musco: Warm bil, no deformities or joint swelling noted.   Neuro: alert, no focal deficits noted.    Skin: Warm, no lesions or rashes    Lab Results:  CBC    Component Value Date/Time   WBC 9.1 04/30/2022 0430   RBC 3.70 (L) 04/30/2022 0430   HGB  11.4 (L) 04/30/2022 0430   HGB 14.1 08/06/2016 1404   HCT 36.8 04/30/2022 0430   HCT 33.9 (L) 03/28/2018 0336   HCT 44.6 08/06/2016 1404   PLT 173 04/30/2022 0430   PLT 217 08/06/2016 1404   MCV 99.5 04/30/2022 0430   MCV 92.3 08/06/2016 1404   MCH 30.8 04/30/2022 0430   MCHC 31.0 04/30/2022 0430   RDW 14.1 04/30/2022 0430   RDW 16.3 (H) 08/06/2016 1404   LYMPHSABS 1.5 04/27/2022 1642   LYMPHSABS 1.7 08/06/2016 1404   MONOABS 0.7 04/27/2022 1642   MONOABS 0.5 08/06/2016 1404   EOSABS 0.3 04/27/2022 1642   EOSABS 0.4 08/06/2016 1404   BASOSABS 0.0 04/27/2022 1642   BASOSABS 0.0 08/06/2016 1404    BMET    Component Value Date/Time   NA 145 04/30/2022 0430   NA 142 08/06/2016 1405   K 3.1 (L) 04/30/2022 0430   K 3.6 08/06/2016 1405   CL 88 (L) 04/30/2022 0430   CO2 >45 (H) 04/30/2022 0430   CO2 35 (H) 08/06/2016 1405   GLUCOSE 106 (H) 04/30/2022 0430   GLUCOSE 93 08/06/2016 1405   BUN 27 (H) 04/30/2022 0430   BUN 18.6 08/06/2016 1405   CREATININE 0.81 04/30/2022 0430   CREATININE 1.24 (H) 06/08/2020 1555   CREATININE 0.8 08/06/2016 1405   CALCIUM 9.5 04/30/2022 0430   CALCIUM 10.1 08/06/2016 1405   GFRNONAA >60 04/30/2022 0430   GFRNONAA 42 (L) 06/08/2020 1555   GFRAA 49 (L) 06/08/2020 1555    BNP    Component Value Date/Time   BNP 16.6 04/27/2022 1642    ProBNP    Component Value Date/Time   PROBNP 19.0 01/02/2021 1550    Imaging: ECHOCARDIOGRAM COMPLETE  Result Date: 04/29/2022    ECHOCARDIOGRAM REPORT   Patient Name:   AALLIYAH KILKER Date of Exam: 04/29/2022 Medical Rec #:  [id number]       Height:       60.0 in Accession #:    [id number]      Weight:       115.8 lb Date of Birth:  09/29/1946       BSA:  1.480 m Patient Age:    53 years        BP:           121/62 mmHg Patient Gender: F               HR:           90 bpm. Exam Location:  Inpatient Procedure: 2D Echo, Cardiac Doppler and Color Doppler Indications:    Dyspnea  History:        Patient  has prior history of Echocardiogram examinations, most                 recent 07/31/2021. CHF, COPD, Signs/Symptoms:Shortness of                 Breath; Risk Factors:Hypertension.  Sonographer:    Joette Catching RCS Referring Phys: [place]  1. Left ventricular ejection fraction, by estimation, is 70 to 75%. The left ventricle has hyperdynamic function. The left ventricle has no regional wall motion abnormalities. Left ventricular diastolic parameters are consistent with Grade I diastolic dysfunction (impaired relaxation).  2. Right ventricular systolic function is normal. The right ventricular size is normal.  3. The mitral valve is normal in structure. No evidence of mitral valve regurgitation. No evidence of mitral stenosis.  4. The aortic valve is normal in structure. Aortic valve regurgitation is not visualized. No aortic stenosis is present.  5. The inferior vena cava is normal in size with <50% respiratory variability, suggesting right atrial pressure of 8 mmHg. Comparison(s): No significant change from prior study. Prior images reviewed side by side. FINDINGS  Left Ventricle: Left ventricular ejection fraction, by estimation, is 70 to 75%. The left ventricle has hyperdynamic function. The left ventricle has no regional wall motion abnormalities. The left ventricular internal cavity size was normal in size. There is no left ventricular hypertrophy. Left ventricular diastolic parameters are consistent with Grade I diastolic dysfunction (impaired relaxation). Indeterminate filling pressures. Right Ventricle: The right ventricular size is normal. No increase in right ventricular wall thickness. Right ventricular systolic function is normal. Left Atrium: Left atrial size was normal in size. Right Atrium: Right atrial size was normal in size. Pericardium: There is no evidence of pericardial effusion. Mitral Valve: The mitral valve is normal in structure. No evidence of mitral valve  regurgitation. No evidence of mitral valve stenosis. Tricuspid Valve: The tricuspid valve is normal in structure. Tricuspid valve regurgitation is not demonstrated. No evidence of tricuspid stenosis. Aortic Valve: The aortic valve is normal in structure. Aortic valve regurgitation is not visualized. No aortic stenosis is present. Aortic valve mean gradient measures 8.0 mmHg. Aortic valve peak gradient measures 14.3 mmHg. Aortic valve area, by VTI measures 2.13 cm. Pulmonic Valve: The pulmonic valve was normal in structure. Pulmonic valve regurgitation is not visualized. No evidence of pulmonic stenosis. Aorta: The aortic root is normal in size and structure. Venous: The inferior vena cava is normal in size with less than 50% respiratory variability, suggesting right atrial pressure of 8 mmHg. IAS/Shunts: No atrial level shunt detected by color flow Doppler.  LEFT VENTRICLE PLAX 2D LVIDd:         3.20 cm   Diastology LVIDs:         2.00 cm   LV e' medial:    6.20 cm/s LV PW:         0.85 cm   LV E/e' medial:  15.7 LV IVS:        1.00  cm   LV e' lateral:   9.68 cm/s LVOT diam:     1.70 cm   LV E/e' lateral: 10.1 LV SV:         71 LV SV Index:   48 LVOT Area:     2.27 cm  RIGHT VENTRICLE             IVC RV Basal diam:  3.70 cm     IVC diam: 1.90 cm RV Mid diam:    2.00 cm RV S prime:     14.70 cm/s TAPSE (M-mode): 2.3 cm LEFT ATRIUM             Index        RIGHT ATRIUM           Index LA diam:        2.30 cm 1.55 cm/m   RA Area:     13.40 cm LA Vol (A2C):   20.0 ml 13.52 ml/m  RA Volume:   33.30 ml  22.50 ml/m LA Vol (A4C):   19.1 ml 12.91 ml/m LA Biplane Vol: 20.6 ml 13.92 ml/m  AORTIC VALVE                     PULMONIC VALVE AV Area (Vmax):    2.02 cm      PV Vmax:       1.33 m/s AV Area (Vmean):   1.98 cm      PV Peak grad:  7.1 mmHg AV Area (VTI):     2.13 cm AV Vmax:           189.00 cm/s AV Vmean:          132.000 cm/s AV VTI:            0.334 m AV Peak Grad:      14.3 mmHg AV Mean Grad:      8.0 mmHg  LVOT Vmax:         168.00 cm/s LVOT Vmean:        115.000 cm/s LVOT VTI:          0.314 m LVOT/AV VTI ratio: 0.94  AORTA Ao Root diam: 2.90 cm Ao Asc diam:  2.40 cm MITRAL VALVE MV Area (PHT): 5.54 cm     SHUNTS MV Decel Time: 137 msec     Systemic VTI:  0.31 m MV E velocity: 97.50 cm/s   Systemic Diam: 1.70 cm MV A velocity: 108.00 cm/s MV E/A ratio:  0.90 Mihai Croitoru MD Electronically signed by Sanda Klein MD Signature Date/Time: 04/29/2022/12:47:46 PM    Final    CT Angio Chest PE W and/or Wo Contrast  Result Date: 04/27/2022 CLINICAL DATA:  Pulmonary embolism suspected, high probability. Chronic shortness of breath, chest pain, and COPD. EXAM: CT ANGIOGRAPHY CHEST WITH CONTRAST TECHNIQUE: Multidetector CT imaging of the chest was performed using the standard protocol during bolus administration of intravenous contrast. Multiplanar CT image reconstructions and MIPs were obtained to evaluate the vascular anatomy. RADIATION DOSE REDUCTION: This exam was performed according to the departmental dose-optimization program which includes automated exposure control, adjustment of the mA and/or kV according to patient size and/or use of iterative reconstruction technique. CONTRAST:  26m OMNIPAQUE IOHEXOL 350 MG/ML SOLN COMPARISON:  07/30/2021. FINDINGS: Cardiovascular: The heart is normal in size and there is a trace pericardial effusion. Scattered coronary artery calcifications are noted. There is atherosclerotic calcification of the aorta without evidence of aneurysm. The pulmonary trunk is normal in  caliber. No pulmonary artery filling defect is identified. Mediastinum/Nodes: No enlarged mediastinal, hilar, or axillary lymph nodes. Thyroid gland, trachea, and esophagus demonstrate no significant findings. Lungs/Pleura: Emphysematous changes are present in the lungs. Mild atelectasis or infiltrate is noted at the lung bases. No effusion or pneumothorax. There is a stable nodular opacity in the left upper lobe  measuring 1 cm, axial image 43. There is a stable 2 mm nodule in the left upper lobe, axial image 46. Stable scarring or atelectasis is noted in the posterior segment of the right upper lobe, axial image 58. There is a stable nodular opacity in the right upper lobe measuring 6 mm, axial image 55. Upper Abdomen: No acute abnormality. Musculoskeletal: Degenerative changes are present in the thoracolumbar spine. No acute osseous abnormality. Review of the MIP images confirms the above findings. IMPRESSION: 1. No evidence of pulmonary embolism. 2. Mild atelectasis or infiltrate at the lung bases. Stable atelectasis or scarring in the posterior segment of the right upper lobe. 3. Stable bilateral pulmonary nodules, the largest measuring 1 cm. Consider one of the following in 3 months for both low-risk and high-risk individuals: (a) repeat chest CT, (b) follow-up PET-CT, or (c) tissue sampling. This recommendation follows the consensus statement: Guidelines for Management of Incidental Pulmonary Nodules Detected on CT Images: From the Fleischner Society 2017; Radiology 2017; 284:228-243. 4. Emphysema. 5. Aortic atherosclerosis with coronary artery calcifications. Electronically Signed   By: Brett Fairy M.D.   On: 04/27/2022 21:13   DG Chest Port 1 View  Result Date: 04/27/2022 CLINICAL DATA:  Shortness of breath. EXAM: PORTABLE CHEST 1 VIEW COMPARISON:  Chest x-ray 07/30/2021.  CT chest 07/30/2021. FINDINGS: Right upper lobe nodular density appears unchanged. There is no new focal lung infiltrate, pleural effusion or pneumothorax. Cardiomediastinal silhouette is within normal limits. No acute fractures. IMPRESSION: 1. No acute cardiopulmonary process. 2. Stable right upper lobe nodular density. Electronically Signed   By: Ronney Asters M.D.   On: 04/27/2022 16:58          No data to display          No results found for: "NITRICOXIDE"      Assessment & Plan:   COPD (chronic obstructive pulmonary  disease) (HCC) Recent exacerbation -requiring hospitalization treated with empiric antibiotics and steroids.  Patient previously been on chronic steroids due to recurrent exacerbations.  We will restart prednisone 5 mg daily. Continue on nocturnal noninvasive vent.  Patient is encouraged on compliance. Contacted DME for download  Plan  Patient Instructions  Continue on Trelegy 1 puff daily.  May use Duoneb nebs every 6hr As needed  Wheezing /shortness of breath .  Continue on Oxygen 3l/m rest and 4l/m with activity .  Restart Prednisone '5mg'$  daily.  Wear Trilogy noninvasive vent each night and with naps.  Follow up Dr. Elsworth Soho  In 6 weeks  and As needed   Please contact office for sooner follow up if symptoms do not improve or worsen or seek emergency care               Protein-calorie malnutrition, severe ([place]) High-protein diet  Acute on chronic respiratory failure with hypoxia ([place]) Recent hospitalization with acute on chronic hypercarbic and hypoxic respiratory failure.  Patient has known chronic hypercarbia.  Previously had been on noninvasive vent.  Recently restarted due to recurrent exacerbation with hypercarbia.  Is prone to recurrent exacerbations and hospitalizations needs noninvasive vent to help decrease exacerbations  Plan  Patient Instructions  Continue on  Trelegy 1 puff daily.  May use Duoneb nebs every 6hr As needed  Wheezing /shortness of breath .  Continue on Oxygen 3l/m rest and 4l/m with activity .  Restart Prednisone '5mg'$  daily.  Wear Trilogy noninvasive vent each night and with naps.  Follow up Dr. Elsworth Soho  In 6 weeks  and As needed   Please contact office for sooner follow up if symptoms do not improve or worsen or seek emergency care               Chronic diastolic congestive heart failure ([place]) Continue current regimen  Physical deconditioning Activity as tolerated    I spent   41 minutes dedicated to the care of this patient on the date  of this encounter to include pre-visit review of records, face-to-face time with the patient discussing conditions above, post visit ordering of testing, clinical documentation with the electronic health record, making appropriate referrals as documented, and communicating necessary findings to members of the patients care team.   Rexene Edison, NP 05/13/2022

## 2022-05-13 NOTE — Assessment & Plan Note (Signed)
Activity as tolerated

## 2022-05-13 NOTE — Assessment & Plan Note (Signed)
Recent hospitalization with acute on chronic hypercarbic and hypoxic respiratory failure.  Patient has known chronic hypercarbia.  Previously had been on noninvasive vent.  Recently restarted due to recurrent exacerbation with hypercarbia.  Is prone to recurrent exacerbations and hospitalizations needs noninvasive vent to help decrease exacerbations  Plan  Patient Instructions  Continue on Trelegy 1 puff daily.  May use Duoneb nebs every 6hr As needed  Wheezing /shortness of breath .  Continue on Oxygen 3l/m rest and 4l/m with activity .  Restart Prednisone '5mg'$  daily.  Wear Trilogy noninvasive vent each night and with naps.  Follow up Dr. Elsworth Soho  In 6 weeks  and As needed   Please contact office for sooner follow up if symptoms do not improve or worsen or seek emergency care

## 2022-05-13 NOTE — Assessment & Plan Note (Signed)
Recent exacerbation -requiring hospitalization treated with empiric antibiotics and steroids.  Patient previously been on chronic steroids due to recurrent exacerbations.  We will restart prednisone 5 mg daily. Continue on nocturnal noninvasive vent.  Patient is encouraged on compliance. Contacted DME for download  Plan  Patient Instructions  Continue on Trelegy 1 puff daily.  May use Duoneb nebs every 6hr As needed  Wheezing /shortness of breath .  Continue on Oxygen 3l/m rest and 4l/m with activity .  Restart Prednisone '5mg'$  daily.  Wear Trilogy noninvasive vent each night and with naps.  Follow up Dr. Elsworth Soho  In 6 weeks  and As needed   Please contact office for sooner follow up if symptoms do not improve or worsen or seek emergency care

## 2022-05-13 NOTE — Assessment & Plan Note (Signed)
Continue current regimen

## 2022-05-13 NOTE — Assessment & Plan Note (Signed)
High-protein diet 

## 2022-05-13 NOTE — Telephone Encounter (Signed)
ATC Jasmine [name] with [employer].  When she returns call we need to know what documentation we need to provide for her to keep the Trilogy vent (Vpro).  Will await return call.

## 2022-05-15 ENCOUNTER — Ambulatory Visit: Payer: TRICARE For Life (TFL) | Admitting: *Deleted

## 2022-05-15 DIAGNOSIS — J9611 Chronic respiratory failure with hypoxia: Secondary | ICD-10-CM

## 2022-05-15 DIAGNOSIS — I5032 Chronic diastolic (congestive) heart failure: Secondary | ICD-10-CM

## 2022-05-15 NOTE — Chronic Care Management (AMB) (Signed)
Care Management    RN Visit Note  05/15/2022 Name: Jasmine [name] MRN: [id number] DOB: 23-Sep-1946  Subjective: Jasmine [name] is a 76 y.o. year old female who is a primary care patient of Biagio Borg, MD. The care management team was consulted for assistance with disease management and care coordination needs.    Engaged with patient by telephone for follow up visit/ RN CM case closure in response to provider referral for case management and/or care coordination services.   Consent to Services:   [name] was given information about Care Management services 11/20/21 including:  Care Management services includes personalized support from designated clinical staff supervised by her physician, including individualized plan of care and coordination with other care providers 24/7 contact phone numbers for assistance for urgent and routine care needs. The patient may stop case management services at any time by phone call to the office staff.  Patient agreed to services and consent obtained.   Assessment: Review of patient past medical history, allergies, medications, health status, including review of consultants reports, laboratory and other test data, was performed as part of comprehensive evaluation and provision of chronic care management services.   SDOH (Social Determinants of Health) assessments and interventions performed:  SDOH Interventions    Flowsheet Row Most Recent Value  SDOH Interventions   Food Insecurity Interventions Intervention Not Indicated  [denies food insecurity today,  confirms did not utilize previously provided resources by [employer] care guide]  Transportation Interventions Intervention Not Indicated  [family and ex-husband continues to provide transportation]     Care Plan  Allergies  Allergen Reactions   Alendronate Sodium Other (See Comments)    Patient does not remember this reaction   Outpatient Encounter Medications as of 05/15/2022   Medication Sig Note   acetaminophen (TYLENOL) 325 MG tablet Take 2 tablets (650 mg total) by mouth every 6 (six) hours as needed for mild pain (or Fever >/= 101).    albuterol (VENTOLIN HFA) 108 (90 Base) MCG/ACT inhaler INHALE 1 TO 2 PUFFS INTO THE LUNGS EVERY 6 HOURS AS NEEDED FOR WHEEZING OR SHORTNESS OF BREATH    aspirin EC 81 MG EC tablet Take 1 tablet (81 mg total) by mouth daily.    atorvastatin (LIPITOR) 10 MG tablet TAKE 1 TABLET BY MOUTH DAILY. (Patient taking differently: Take 10 mg by mouth daily.)    cefdinir (OMNICEF) 300 MG capsule Take 1 capsule (300 mg total) by mouth 2 (two) times daily.    citalopram (CELEXA) 20 MG tablet TAKE ONE TABLET BY MOUTH DAILY (Patient taking differently: Take 20 mg by mouth daily. TAKE ONE TABLET BY MOUTH DAILY)    fluticasone (FLONASE) 50 MCG/ACT nasal spray Place 1 spray into both nostrils daily.    Fluticasone-Umeclidin-Vilant (TRELEGY ELLIPTA) 200-62.5-25 MCG/INH AEPB Inhale 1 puff into the lungs daily. 04/28/2022: Stated that she tries to use it everyday    furosemide (LASIX) 20 MG tablet Take 1 tablet (20 mg total) by mouth daily.    guaiFENesin (ROBITUSSIN) 100 MG/5ML liquid Take 5 mLs by mouth every 4 (four) hours as needed for cough or to loosen phlegm. Mucinex chest congestion (liquid)    ipratropium-albuterol (DUONEB) 0.5-2.5 (3) MG/3ML SOLN Take 3 mLs by nebulization every 6 (six) hours as needed. (Patient taking differently: Take 3 mLs by nebulization every 6 (six) hours as needed (for shortness of breath).)    levothyroxine (SYNTHROID) 75 MCG tablet Take 1 tablet (75 mcg total) by mouth daily before breakfast.  TAKE ONE TABLET BY MOUTH EARLY MORNING    loratadine (CLARITIN) 10 MG tablet Take 1 tablet (10 mg total) by mouth daily.    OXYGEN Inhale 3 L into the lungs continuous. 24/7    potassium chloride (KLOR-CON M) 10 MEQ tablet Take 1 tablet (10 mEq total) by mouth daily.    predniSONE (DELTASONE) 5 MG tablet Take 1 tablet (5 mg total) by  mouth daily with breakfast.    No facility-administered encounter medications on file as of 05/15/2022.   Patient Active Problem List   Diagnosis Date Noted   Vitamin D deficiency 03/01/2022   Physical deconditioning 12/05/2021   COPD exacerbation ([place]) 07/30/2021   Diabetes mellitus due to underlying condition without complication, without long-term current use of insulin ([place]) 07/22/2021   Steroid-dependent chronic obstructive pulmonary disease ([place]) 07/22/2021   Postablative hypothyroidism 07/22/2021   Encounter for well adult exam with abnormal findings 04/20/2021   Malnutrition of moderate degree 08/16/2019   Respiratory acidosis 03/07/2019   Goals of care, counseling/discussion 09/20/2018   Edema due to congestive heart failure ([place]) 07/27/2018   Palliative care by specialist    Shortness of breath    Chronic low back pain 04/30/2018   COPD with acute exacerbation ([place])    Hypothyroidism    Anxiety    Respiratory failure with hypercapnia ([place]) 03/24/2018   Hypercarbia 03/22/2018   Polysubstance abuse ([place]) 01/26/2018   Aspiration into airway 06/03/2017   Rash 07/12/2016   Bilateral hearing loss 06/05/2016   Hyperglycemia 05/21/2016   Abnormal LFTs 03/24/2016   Solitary pulmonary nodule 01/09/2016   Acute on chronic respiratory failure with hypoxia ([facility])    On home oxygen therapy    Chronic diastolic congestive heart failure ([place])    Bacteremia Step vir[facility]ans 12/27/2015   Acute on chronic respiratory failure ([place]) 12/25/2015   Former smoker 12/25/2015   Hypothyroidism following radioiodine therapy 09/26/2014   Protein-calorie malnutrition, severe ([place]) 06/23/2014   Cor pulmonale ([place]) 01/12/2013   COPD (chronic obstructive pulmonary disease) ([place]) 11/29/2011   Macrocytosis without anemia 11/09/2011   Palliative care encounter 04/09/2011   SINUSITIS, CHRONIC 10/10/2010   Fatigue 10/10/2010   Depression with anxiety 07/21/2008   Essential hypertension 05/01/2007    ALLERGIC RHINITIS 05/01/2007   Asthma 05/01/2007   OSTEOPOROSIS 05/01/2007   Conditions to be addressed/monitored: CHF and COPD  Care Plan : RN Care Manager Plan of Care  Updates made by Knox Royalty, RN since 05/15/2022 12:00 AM     Problem: Chronic Disease Management Needs   Priority: High     Long-Range Goal: Ongoing adherence to established plan of care for long term chronic disease management   Start Date: 11/21/2021  Expected End Date: 11/21/2022  Priority: High  Note:   Current Barriers:  Chronic Disease Management support and education needs related to CHF and COPD Frequent falls- patient reports 2-plus recent falls, both without serious injury; reports uses walker "all the time" 03/11/22: denies new recent fall since our outreach on 12/03/21 05/15/22: denies new/ recent falls since last outreach 03/11/22; reports not currently using assistive devices Fragile state of health, multiple progressing severe chronic health conditions: on continuous home O2 at 3 L/min Hospitalization October 11-13, 2022 for acute on chronic respiratory failure/ CHF exacerbation Hospitalization July 10-13, 2023 for COPD exacerbation Inconsistent adherence to attending provider appointments Cancelled post-hospital discharge provider office visits to pulmonary provider 10/18/21; no-show to PCP 10/07/22 Was previously active with outpatient palliative care team; palliative care case closure 05/16/21:  they were unable to maintain contact with patient for ongoing palliative care services/ care 05/15/22: confirmed patient has re-engaged with [facility] team SDOH concerns as follows: 12/03/21: confirmed [employer] has contacted/ spoken with patient and provided resources as requested Transportation: reports does not drive self; can usually depend on roommate (who happens to be her ex-husband) and/ or extended family ("nieces and nephews"), however, she reports, "they are not doing  so good lately either;" she is uncertain if she will be able to rely on these family members in the future Finances and food acquisition, in light of current economy: reports does not qualify for food stamps or medicaid, "they say I make too much money" Home in need of repair: reports "front porch steps needs sturdy railing, and the steps need repair;" confirms she does have a ramp that she is able to use off the back entry Need for assistance with hygiene/ bathing, household management/ chores, shopping, and errands  RNCM Clinical Goal(s):  Patient will demonstrate improved health management independence as evidenced by adherence to plan of care for COPD; CHF        through collaboration with RN Care manager, provider, and care team.   Interventions: 1:1 collaboration with primary care provider regarding development and update of comprehensive plan of care as evidenced by provider attestation and co-signature Inter-disciplinary care team collaboration (see longitudinal plan of care) Evaluation of current treatment plan related to  self management and patient's adherence to plan as established by provider 12/03/21: CCM RN CM initial assessment completed; SDOH  needs- confirmed [employer] has contacted patient and provided resources Review of patient status, including review of consultants reports, relevant laboratory and other test results, and medications completed SDOH updated: patient confirms ongoing desire for assistance at home; however, she admits she has not followed up on resources for same, previously provided by [employer] team: she was encouraged to follow up on resources that were provided; she again tells me she does not qualify for medicaid/ food stamps Falls assessment updated: denies new/ recent falls since our last outreach, "since back in the winter;" and tells me that she is feeling so much better, she is not having to use her cane/ walker  "nearly as much;" in fact, she reports "don't use much at all now;" positive reinforcement provided with encouragement to continue efforts Medications discussed: reports continues to independently self-manage and denies current concerns/ issues/ questions around medications; endorses adherence to taking all medications as prescribed Reviewed recent hospitalization: patient reports she obtained "new bipap machine," and tells me "this has made all the difference;" she tells me her breathing "is so much better;" confirms she continues using home O2 "all the time" also; denies clinical concerns today and tells me her breathing is "fine;" she sounds to be in no distress throughout our phone call today Reviewed recent provider appointments: 05/02/22- Palliative Care team; 05/13/22- pulmonary provider; patient verbalizes a good understanding of post-office visit instructions and denies questions Reviewed upcoming scheduled provider appointments: 07/01/22- [facility] visit; 07/17/22- pulmonary specialist; patient confirms is aware of all and has plans to attend as scheduled Discussed plans with patient for ongoing care management follow up- patient denies current care coordination/ care management needs and is agreeable to CCM RN CM case closure today; verbalizes understanding to contact PCP or other care providers for any needs that arise in the future, and confirms she has contact information for all care providers  Heart Failure/ COPD Interventions:  (Status: 05/15/22: Goal Met.)  Long Term Goal  Wt Readings from Last 3 Encounters:  03/05/22 115 lb (52.2 kg)  02/25/22 116 lb 12.8 oz (53 kg)  02/25/22 116 lb 12.8 oz (53 kg)  Reviewed role of diuretics in prevention of fluid overload and management of heart failure Discussed the importance of keeping all appointments with provider Advised patient to track and manage COPD triggers Advised patient to self assesses COPD action plan zone and  make appointment with provider if in the yellow zone for 48 hours without improvement Discussed the importance of adequate rest and management of fatigue with COPD  Confirmed patient continues self-managing medications, denies concerns around medications Reports continues using rescue inhaler 1-2 times per day Reports using nebulizer 1-2 times per day Endorses adherence to use of maintenance inhaler (Trelegy) QD Reports continues using home O2 as prescribed; runs at 3-4 L/min Confirms using newly obtained biPAP Reports baseline swelling of lower extremities without change: confirmed patient continues to endorse taking diuretic as prescribed; has been elevating legs, which she reports "seems to help with the swelling" she remains a fall risk and is unable to monitor daily weights at home    Plan:  No further follow up required: patient denies current care coordination/ care management needs and is agreeable to RN CM case closure today; confirms she has community palliative care team re-involved in her care, denies ongoing care management needs; RN CM case closure accordingly  [name], RN, BSN, [place] [phone]: direct office

## 2022-05-19 NOTE — Addendum Note (Signed)
Addended by: Gayland Curry on: 05/19/2022 08:11 PM   Modules accepted: Level of Service

## 2022-05-20 ENCOUNTER — Other Ambulatory Visit: Payer: Medicare HMO

## 2022-05-20 DIAGNOSIS — Z515 Encounter for palliative care: Secondary | ICD-10-CM

## 2022-05-21 ENCOUNTER — Other Ambulatory Visit: Payer: Self-pay | Admitting: Internal Medicine

## 2022-05-21 NOTE — Telephone Encounter (Signed)
Please refill as per office routine med refill policy (all routine meds to be refilled for 3 mo or monthly (per pt preference) up to one year from last visit, then month to month grace period for 3 mo, then further med refills will have to be denied) ? ?

## 2022-05-22 NOTE — Progress Notes (Signed)
COMMUNITY PALLIATIVE CARE SW NOTE  PATIENT NAME: Jasmine [name] DOB: Dec 13, 1945 MRN: [id number]  PRIMARY CARE PROVIDER: Biagio Borg, MD  RESPONSIBLE PARTY:  Acct ID - Guarantor Home Phone Work Phone Relationship Acct Type  [id number] [name], [phone]  Self P/F     [address] [place], [place] [address]   SOCIAL WORK TELEPHONIC ENCOUNTER  PC SW completed a follow-up call to patient to provide support and resources to her per request of Dr. Mariea Clonts.  Patient reported that she was doing better than she had been, but is having trouble completing her ADL's. She states she needed assistance with personal care and light housekeeping. She has DME (shower chair, walker) and will soon have grab bars installed in her shower to make things safer for to get into the shower. Patient states since getting the non-invasive ventilator, she is more fatigue and shore of breath. 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 Patient reports that she is in the [place] system and has received services from them. SW encouraged her to contact [facility] regarding her current needs to see if she qualifies for any in-home assistance. SW provided her a list of agencies that also provide in-home assistance, but reiterated to her that this will be an out-of-pocket expense for her. SW talked with patient briefly about hospice services as well. The patient did not feel she was ready for hospice, but verbalized understanding of the services, particularly as it relates to difference between palliative and hospice services. Patient remains open to ongoing palliative care/SW visits and support. SW to remain available to patient for any additional resources or psychosocial needs as needed.    [address] [place], [place]

## 2022-06-27 ENCOUNTER — Other Ambulatory Visit: Payer: Self-pay | Admitting: Internal Medicine

## 2022-06-30 ENCOUNTER — Telehealth: Payer: Self-pay | Admitting: Internal Medicine

## 2022-06-30 MED ORDER — POTASSIUM CHLORIDE CRYS ER 10 MEQ PO TBCR: 10.0000 meq | EXTENDED_RELEASE_TABLET | Freq: Every day | ORAL | 3 refills | Status: DC

## 2022-06-30 NOTE — Telephone Encounter (Signed)
It looks as though this medication was prescribed by Dr.Matthews, should patient call her office for refill instead.

## 2022-06-30 NOTE — Telephone Encounter (Signed)
Patient needs her potassium refilled - please send to [facility] in [place], [place]

## 2022-07-01 ENCOUNTER — Other Ambulatory Visit: Payer: Medicare HMO | Admitting: Internal Medicine

## 2022-07-01 VITALS — BP 118/64 | HR 78 | Temp 97.7°F | Ht 61.0 in | Wt 113.2 lb

## 2022-07-01 DIAGNOSIS — R634 Abnormal weight loss: Secondary | ICD-10-CM

## 2022-07-01 DIAGNOSIS — I5032 Chronic diastolic (congestive) heart failure: Secondary | ICD-10-CM

## 2022-07-01 DIAGNOSIS — J9611 Chronic respiratory failure with hypoxia: Secondary | ICD-10-CM

## 2022-07-01 DIAGNOSIS — J432 Centrilobular emphysema: Secondary | ICD-10-CM

## 2022-07-01 DIAGNOSIS — J9612 Chronic respiratory failure with hypercapnia: Secondary | ICD-10-CM | POA: Diagnosis not present

## 2022-07-01 DIAGNOSIS — Z515 Encounter for palliative care: Secondary | ICD-10-CM | POA: Diagnosis not present

## 2022-07-01 NOTE — Progress Notes (Signed)
[facility] Palliative Care Follow-Up Visit Telephone: [phone]  Fax: [phone]   Date of encounter: 07/01/22 7:32 AM PATIENT NAME: Jasmine [name] [address] [place] [address]   [phone] (home)  DOB: [birth year] MRN: [id number] PRIMARY CARE PROVIDER:    Biagio Borg, MD,  [place] [address] [phone]  REFERRING PROVIDER:   Biagio Borg, MD [address] [place],  [place] [address] [phone]  RESPONSIBLE PARTY:    Contact Information     Name Relation Home Work Mobile   Smith,Donald Significant other   [phone]   Willene Hatchet Niece   [phone]   Irven Easterly Niece [phone]          I met face to face with patient and family in her home. Palliative Care was asked to follow this patient by consultation request of  Biagio Borg, MD to address advance care planning and complex medical decision making. This is follow-up visit.                                     ASSESSMENT AND PLAN / RECOMMENDATIONS:   Advance Care Planning/Goals of Care: Goals include to maximize quality of life and symptom management. Patient/health care surrogate gave his/her permission to discuss.Our advance care planning conversation included a discussion about:    The value and importance of advance care planning  Experiences with loved ones who have been seriously ill or have died  Exploration of personal, cultural or spiritual beliefs that might influence medical decisions  Exploration of goals of care in the event of a sudden injury or illness  Identification  of a healthcare agent--has living will and HCPOA in her safety deposit box.  Smith,Donald (Significant other)  [phone] (Mobile) her ex-husband is her HCPOA. Backups are her two nieces.  She thinks she needs to go back to her attorney to get that straight.   Review and updating or creation of an  advance directive document . Decision not to  resuscitate or to de-escalate disease focused treatments due to poor prognosis. CODE STATUS:  FULL CODE unless she's brain dead already--requests CPR, and would want to be put on a ventilator until it's made known that she does not have brain function and that [name] would decide from there She likes to pass on the word of God and piece of mind to others.    Symptom Management/Plan: 1. Centrilobular emphysema ([place]) -continue current regimen per pulmonary--encouraged use of nebs and rescue inhaler when dyspneic especially pre shower  2. Chronic respiratory failure with hypoxia and hypercapnia (HCC) -continue trilogy at hs and 3-4L O2 during day, has helped considerably with hypercapnia   3. Chronic diastolic congestive heart failure (HCC) -no signs of overload at this time  4. Weight loss -snacks throughout day, but encouraged a bit larger items than she's having and using supplement shakes as well--candy bars not best option for her as she needs protein  5. Palliative care by specialist -[facility] after [facility] work spoke with her but has not (had two deaths in family and occupied with things around that)--reminded her about that plan    Follow up Palliative Care Visit: Palliative care will continue to follow for complex medical decision making, advance care planning, and clarification of goals.   This visit was coded based on medical decision making (MDM).  16 mins spent on ACP.  PPS: 50%  HOSPICE ELIGIBILITY/DIAGNOSIS: TBD  Chief Complaint: Follow-up palliative visit  HISTORY OF PRESENT ILLNESS:  Jasmine [name] is a 76 y.o. year old female  with COPD, CHF, diabetes, hypothyroidism and weight loss seen for palliative f/u at home .   She's doing better on trilogy and with prednisone--helped appetite and she is still only eating when hungry though and down a couple more lbs.  Had two deaths in the family.  She uses her HFA first thing then nebs later  on.  Needing hfa less since prednisone added and her energy is better--she was excited to greet me at the door.  History obtained from review of EMR, discussion with primary team, and interview with family, facility staff/caregiver and/or Ms. Moll.  I reviewed available labs, medications, imaging, studies and related documents from the EMR.  Records reviewed and summarized above.   ROS Review of Systems  Constitutional:  Positive for appetite change and unexpected weight change. Negative for activity change, chills and fever.  Respiratory:  Positive for shortness of breath and wheezing. Negative for chest tightness.   Cardiovascular:  Positive for leg swelling.  Gastrointestinal:  Negative for abdominal pain and constipation.  Genitourinary:  Negative for dysuria.  Skin:  Negative for color change.  Neurological:  Negative for dizziness and weakness.  Hematological:  Bruises/bleeds easily.  Psychiatric/Behavioral:  Positive for confusion. Negative for sleep disturbance.     Physical Exam: Vitals:   07/01/22 0731  BP: 118/64  Pulse: 78  Temp: 97.7 F (36.5 C)  SpO2: 94%  Weight: 115 lb 12.8 oz (52.5 kg)  Height: 5' 1"  (1.549 m)   Body mass index is 21.88 kg/m. Wt Readings from Last 500 Encounters:  07/01/22 115 lb 12.8 oz (52.5 kg)  05/13/22 115 lb 12.8 oz (52.5 kg)  04/27/22 115 lb 12.8 oz (52.5 kg)  03/05/22 115 lb (52.2 kg)  02/25/22 116 lb 12.8 oz (53 kg)  02/25/22 116 lb 12.8 oz (53 kg)  12/05/21 120 lb (54.4 kg)  08/01/21 124 lb 12.5 oz (56.6 kg)  07/22/21 130 lb (59 kg)  04/19/21 130 lb (59 kg)  12/20/20 124 lb 12.8 oz (56.6 kg)  11/22/20 126 lb 6.4 oz (57.3 kg)  08/14/20 120 lb 3.2 oz (54.5 kg)  06/11/20 109 lb (49.4 kg)  06/08/20 109 lb (49.4 kg)  04/19/20 110 lb (49.9 kg)  03/29/20 119 lb (54 kg)  03/27/20 111 lb 6.4 oz (50.5 kg)  03/20/20 111 lb 12.8 oz (50.7 kg)  03/16/20 115 lb (52.2 kg)  03/09/20 113 lb (51.3 kg)  03/01/20 117 lb 8.1 oz (53.3 kg)   02/13/20 116 lb (52.6 kg)  02/01/20 118 lb 2.7 oz (53.6 kg)  12/14/19 115 lb 3.2 oz (52.3 kg)  12/07/19 130 lb (59 kg)  09/08/19 118 lb 12.8 oz (53.9 kg)  08/30/19 120 lb (54.4 kg)  08/15/19 117 lb 8.1 oz (53.3 kg)  03/28/19 117 lb (53.1 kg)  03/07/19 119 lb 7.8 oz (54.2 kg)  12/28/18 116 lb (52.6 kg)  12/16/18 114 lb 8 oz (51.9 kg)  11/24/18 117 lb (53.1 kg)  11/16/18 114 lb (51.7 kg)  09/20/18 115 lb (52.2 kg)  09/07/18 115 lb (52.2 kg)  09/06/18 114 lb 3.2 oz (51.8 kg)  07/08/18 108 lb (49 kg)  07/06/18 108 lb 6.4 oz (49.2 kg)  06/16/18 108 lb (49 kg)  05/27/18 115 lb 8.3 oz (52.4 kg)  05/04/18 111 lb 6.4  oz (50.5 kg)  04/30/18 106 lb (48.1 kg)  04/28/18 104 lb 3.2 oz (47.3 kg)  04/21/18 114 lb 3.2 oz (51.8 kg)  04/15/18 106 lb 11.2 oz (48.4 kg)  04/14/18 108 lb (49 kg)  04/02/18 120 lb 13 oz (54.8 kg)  03/24/18 120 lb 9.5 oz (54.7 kg)  03/22/18 107 lb 9.4 oz (48.8 kg)  02/18/18 109 lb (49.4 kg)  01/26/18 112 lb 6.4 oz (51 kg)  01/08/18 108 lb 3.9 oz (49.1 kg)  12/17/17 111 lb 6.4 oz (50.5 kg)  09/15/17 111 lb 14.4 oz (50.8 kg)  08/31/17 109 lb (49.4 kg)  06/16/17 108 lb 12.8 oz (49.4 kg)  06/03/17 108 lb (49 kg)  02/23/17 110 lb (49.9 kg)  11/11/16 112 lb 12.8 oz (51.2 kg)  08/18/16 118 lb (53.5 kg)  08/13/16 116 lb 4.8 oz (52.8 kg)  07/10/16 116 lb (52.6 kg)  06/26/16 110 lb 12.8 oz (50.3 kg)  06/05/16 114 lb (51.7 kg)  06/05/16 112 lb 9.6 oz (51.1 kg)  05/22/16 110 lb 10.7 oz (50.2 kg)  05/14/16 110 lb (49.9 kg)  05/08/16 110 lb 6.4 oz (50.1 kg)  05/08/16 110 lb 6.4 oz (50.1 kg)  04/18/16 109 lb 1.6 oz (49.5 kg)  04/09/16 109 lb (49.4 kg)  03/27/16 101 lb 4.8 oz (45.9 kg)  01/16/16 95 lb (43.1 kg)  01/15/16 96 lb (43.5 kg)  01/08/16 90 lb 3.2 oz (40.9 kg)  12/28/15 89 lb 15.2 oz (40.8 kg)  12/27/15 87 lb 12.8 oz (39.8 kg)  10/09/15 92 lb (41.7 kg)  01/11/15 90 lb 6.4 oz (41 kg)  11/30/14 93 lb (42.2 kg)  09/26/14 89 lb (40.4 kg)  08/30/14 88 lb 6.4  oz (40.1 kg)  07/10/14 91 lb (41.3 kg)  06/21/14 97 lb 3.6 oz (44.1 kg)  08/12/13 97 lb (44 kg)  05/02/13 92 lb 12.8 oz (42.1 kg)  03/02/13 97 lb 6.4 oz (44.2 kg)  02/03/13 107 lb 3.2 oz (48.6 kg)  01/12/13 103 lb 8 oz (46.9 kg)  08/18/12 101 lb (45.8 kg)  11/27/11 107 lb 6 oz (48.7 kg)  11/09/11 113 lb 14.4 oz (51.7 kg)  08/11/11 117 lb 4 oz (53.2 kg)  03/09/07 115 lb (52.2 kg)   Physical Exam Constitutional:      General: She is not in acute distress.    Appearance: She is ill-appearing.     Comments: Frail female but walking around and able to greet me at door which she was not able to do a few weeks ago by her report  Cardiovascular:     Rate and Rhythm: Normal rate and regular rhythm.  Pulmonary:     Breath sounds: Wheezing present.     Comments: Prolonged expiration Abdominal:     General: Bowel sounds are normal.  Musculoskeletal:        General: Normal range of motion.     Right lower leg: Edema present.     Left lower leg: Edema present.     Comments: Nonpitting edema  Skin:    General: Skin is warm and dry.  Neurological:     General: No focal deficit present.     Mental Status: She is alert and oriented to person, place, and time.  Psychiatric:        Mood and Affect: Mood normal.     CURRENT PROBLEM LIST:  Patient Active Problem List   Diagnosis Date Noted   Vitamin D deficiency 03/01/2022  Physical deconditioning 12/05/2021   COPD exacerbation ([place]) 07/30/2021   Diabetes mellitus due to underlying condition without complication, without long-term current use of insulin ([place]) 07/22/2021   Steroid-dependent chronic obstructive pulmonary disease ([place]) 07/22/2021   Postablative hypothyroidism 07/22/2021   Encounter for well adult exam with abnormal findings 04/20/2021   Malnutrition of moderate degree 08/16/2019   Respiratory acidosis 03/07/2019   Goals of care, counseling/discussion 09/20/2018   Edema due to congestive heart failure ([place]) 07/27/2018    Palliative care by specialist    Shortness of breath    Chronic low back pain 04/30/2018   COPD with acute exacerbation ([place])    Hypothyroidism    Anxiety    Respiratory failure with hypercapnia ([place]) 03/24/2018   Hypercarbia 03/22/2018   Polysubstance abuse ([place]) 01/26/2018   Aspiration into airway 06/03/2017   Rash 07/12/2016   Bilateral hearing loss 06/05/2016   Hyperglycemia 05/21/2016   Abnormal LFTs 03/24/2016   Solitary pulmonary nodule 01/09/2016   Acute on chronic respiratory failure with hypoxia (HCC)    On home oxygen therapy    Chronic diastolic congestive heart failure ([place])    Bacteremia Step viridans 12/27/2015   Acute on chronic respiratory failure ([place]) 12/25/2015   Former smoker 12/25/2015   Hypothyroidism following radioiodine therapy 09/26/2014   Protein-calorie malnutrition, severe ([place]) 06/23/2014   Cor pulmonale ([place]) 01/12/2013   COPD (chronic obstructive pulmonary disease) ([place]) 11/29/2011   Macrocytosis without anemia 11/09/2011   Palliative care encounter 04/09/2011   SINUSITIS, CHRONIC 10/10/2010   Fatigue 10/10/2010   Depression with anxiety 07/21/2008   Essential hypertension 05/01/2007   ALLERGIC RHINITIS 05/01/2007   Asthma 05/01/2007   OSTEOPOROSIS 05/01/2007    PAST MEDICAL HISTORY:  Active Ambulatory Problems    Diagnosis Date Noted   Depression with anxiety 07/21/2008   Essential hypertension 05/01/2007   ALLERGIC RHINITIS 05/01/2007   Asthma 05/01/2007   OSTEOPOROSIS 05/01/2007   SINUSITIS, CHRONIC 10/10/2010   Fatigue 10/10/2010   Palliative care encounter 04/09/2011   Macrocytosis without anemia 11/09/2011   COPD (chronic obstructive pulmonary disease) ([place]) 11/29/2011   Cor pulmonale ([place]) 01/12/2013   Protein-calorie malnutrition, severe ([place]) 06/23/2014   Hypothyroidism following radioiodine therapy 09/26/2014   Acute on chronic respiratory failure ([place]) 12/25/2015   Former smoker 12/25/2015   Bacteremia Step viridans  12/27/2015   Acute on chronic respiratory failure with hypoxia ([place])    On home oxygen therapy    Chronic diastolic congestive heart failure ([place])    Solitary pulmonary nodule 01/09/2016   Abnormal LFTs 03/24/2016   Hyperglycemia 05/21/2016   Bilateral hearing loss 06/05/2016   Rash 07/12/2016   Aspiration into airway 06/03/2017   Polysubstance abuse ([place]) 01/26/2018   Hypercarbia 03/22/2018   Respiratory failure with hypercapnia ([place]) 03/24/2018   COPD with acute exacerbation (HCC)    Hypothyroidism    Anxiety    Chronic low back pain 04/30/2018   Palliative care by specialist    Shortness of breath    Edema due to congestive heart failure ([place]) 07/27/2018   Goals of care, counseling/discussion 09/20/2018   Respiratory acidosis 03/07/2019   Malnutrition of moderate degree 08/16/2019   Encounter for well adult exam with abnormal findings 04/20/2021   Diabetes mellitus due to underlying condition without complication, without long-term current use of insulin ([place]) 07/22/2021   Steroid-dependent chronic obstructive pulmonary disease ([place]) 07/22/2021   Postablative hypothyroidism 07/22/2021   COPD exacerbation ([place] 07/30/2021   Physical deconditioning 12/05/2021   Vitamin D deficiency 03/01/2022  Resolved Ambulatory Problems    Diagnosis Date Noted   Acute sinus infection 10/10/2008   SHOULDER PAIN, LEFT 08/23/2009   LUMBAR RADICULOPATHY, RIGHT 08/25/2008   BACK PAIN 10/10/2008   Vaginitis 08/11/2011   Back pain 08/11/2011   Bronchitis 11/09/2011   Abnormal CXR 11/09/2011   Acute respiratory failure with hypoxia ([place]) 11/09/2011   Hyponatremia 11/09/2011   COPD exacerbation ([place]) 11/27/2011   Smoker 01/12/2013   Hypercarbia 06/20/2014   Acute on chronic respiratory failure with hypercapnia ([place]) 11/30/2014   CHF exacerbation ([place]) 12/24/2015   Hypomagnesemia 12/25/2015   Hypothyroidism 12/27/2015   Staphylococcus aureus bacteremia 12/28/2015   Viridans streptococci  infection 12/28/2015   CHF (congestive heart failure) ([place]) 03/24/2016   Chest pain 03/24/2016   Lung mass 03/24/2016   Elevated blood sugar 03/24/2016   Acute on chronic respiratory failure with hypoxemia (HCC)    Bilateral pleural effusion    HCAP (healthcare-associated pneumonia) 05/21/2016   Insect bite 07/12/2016   Acute on chronic respiratory failure with hypoxia and hypercapnia ([place]) 01/07/2018   Cerumen impaction 02/18/2018   Dehydration 03/22/2018   Acute on chronic respiratory failure with hypercapnia ([place]) 03/23/2018   Acute encephalopathy 03/27/2018   Acute respiratory failure with hypoxia and hypercapnia (HCC)    Respiratory failure ([place]) 12/16/2018   Acute respiratory failure with hypoxia and hypercarbia (HCC) 03/07/2019   Type II respiratory failure ([place]) 08/14/2019   Acute on chronic respiratory failure with hypercapnia ([place]) 01/29/2020   Past Medical History:  Diagnosis Date   Arthritis    ASTHMA 05/01/2007   Cancer ([place])    Chronic bronchitis ([place])    DEPRESSION 07/21/2008   DVT (deep venous thrombosis) ([place]) 1960s   FATIGUE 10/10/2010   HYPERTENSION 05/01/2007   HYPOTHYROIDISM 08/23/2009    SOCIAL HX:  Social History   Tobacco Use   Smoking status: Former    Packs/day: 1.00    Years: 52.00    Total pack years: 52.00    Types: Cigarettes    Start date: 1965    Quit date: 07/20/2016    Years since quitting: 5.9   Smokeless tobacco: Never  Substance Use Topics   Alcohol use: Not Currently     ALLERGIES:  Allergies  Allergen Reactions   Alendronate Sodium Other (See Comments)    Patient does not remember this reaction      PERTINENT MEDICATIONS:  Outpatient Encounter Medications as of 07/01/2022  Medication Sig   acetaminophen (TYLENOL) 325 MG tablet Take 2 tablets (650 mg total) by mouth every 6 (six) hours as needed for mild pain (or Fever >/= 101).   albuterol (VENTOLIN HFA) 108 (90 Base) MCG/ACT inhaler INHALE 1 TO 2 PUFFS INTO THE LUNGS  EVERY 6 HOURS AS NEEDED FOR WHEEZING OR SHORTNESS OF BREATH   aspirin EC 81 MG EC tablet Take 1 tablet (81 mg total) by mouth daily.   atorvastatin (LIPITOR) 10 MG tablet TAKE 1 TABLET BY MOUTH DAILY. (Patient taking differently: Take 10 mg by mouth daily.)   cefdinir (OMNICEF) 300 MG capsule Take 1 capsule (300 mg total) by mouth 2 (two) times daily.   citalopram (CELEXA) 20 MG tablet TAKE ONE TABLET BY MOUTH DAILY (Patient taking differently: Take 20 mg by mouth daily. TAKE ONE TABLET BY MOUTH DAILY)   fluticasone (FLONASE) 50 MCG/ACT nasal spray Place 1 spray into both nostrils daily.   Fluticasone-Umeclidin-Vilant (TRELEGY ELLIPTA) 200-62.5-25 MCG/ACT AEPB INHALE 1 PUFF INTO THE LUNGS DAILY.   furosemide (LASIX) 20 MG tablet  Take 1 tablet (20 mg total) by mouth daily.   guaiFENesin (ROBITUSSIN) 100 MG/5ML liquid Take 5 mLs by mouth every 4 (four) hours as needed for cough or to loosen phlegm. Mucinex chest congestion (liquid)   ipratropium-albuterol (DUONEB) 0.5-2.5 (3) MG/3ML SOLN Take 3 mLs by nebulization every 6 (six) hours as needed. (Patient taking differently: Take 3 mLs by nebulization every 6 (six) hours as needed (for shortness of breath).)   levothyroxine (SYNTHROID) 75 MCG tablet Take 1 tablet (75 mcg total) by mouth daily before breakfast. TAKE ONE TABLET BY MOUTH EARLY MORNING   loratadine (CLARITIN) 10 MG tablet Take 1 tablet (10 mg total) by mouth daily.   OXYGEN Inhale 3 L into the lungs continuous. 24/7   potassium chloride (KLOR-CON M) 10 MEQ tablet Take 1 tablet (10 mEq total) by mouth daily.   predniSONE (DELTASONE) 5 MG tablet Take 1 tablet (5 mg total) by mouth daily with breakfast.   No facility-administered encounter medications on file as of 07/01/2022.    Thank you for the opportunity to participate in the care of Ms. Gastelum.  The palliative care team will continue to follow. Please call our office at [phone] if we can be of additional assistance.   Hollace Kinnier, DO  COVID-19 PATIENT SCREENING TOOL Asked and negative response unless otherwise noted:  Have you had symptoms of covid, tested positive or been in contact with someone with symptoms/positive test in the past 5-10 days? No

## 2022-07-02 DIAGNOSIS — B9561 Methicillin susceptible Staphylococcus aureus infection as the cause of diseases classified elsewhere: Secondary | ICD-10-CM | POA: Diagnosis not present

## 2022-07-02 DIAGNOSIS — R7881 Bacteremia: Secondary | ICD-10-CM | POA: Diagnosis not present

## 2022-07-02 DIAGNOSIS — J9612 Chronic respiratory failure with hypercapnia: Secondary | ICD-10-CM | POA: Diagnosis not present

## 2022-07-02 DIAGNOSIS — J449 Chronic obstructive pulmonary disease, unspecified: Secondary | ICD-10-CM | POA: Diagnosis not present

## 2022-07-16 ENCOUNTER — Encounter: Payer: Self-pay | Admitting: Internal Medicine

## 2022-07-17 ENCOUNTER — Ambulatory Visit: Payer: TRICARE For Life (TFL) | Admitting: Pulmonary Disease

## 2022-07-18 ENCOUNTER — Inpatient Hospital Stay (HOSPITAL_COMMUNITY)
Admission: EM | Admit: 2022-07-18 | Discharge: 2022-07-21 | DRG: 190 | Disposition: A | Discharge status: Home Health | Payer: Medicare PPO | Attending: Family Medicine | Admitting: Family Medicine

## 2022-07-18 ENCOUNTER — Other Ambulatory Visit: Payer: Self-pay

## 2022-07-18 ENCOUNTER — Telehealth: Payer: Self-pay | Admitting: Pulmonary Disease

## 2022-07-18 ENCOUNTER — Emergency Department (HOSPITAL_COMMUNITY): Payer: Medicare PPO

## 2022-07-18 ENCOUNTER — Encounter (HOSPITAL_COMMUNITY): Payer: Self-pay | Admitting: Internal Medicine

## 2022-07-18 DIAGNOSIS — J432 Centrilobular emphysema: Principal | ICD-10-CM | POA: Diagnosis present

## 2022-07-18 DIAGNOSIS — I213 ST elevation (STEMI) myocardial infarction of unspecified site: Secondary | ICD-10-CM | POA: Diagnosis not present

## 2022-07-18 DIAGNOSIS — I5032 Chronic diastolic (congestive) heart failure: Secondary | ICD-10-CM | POA: Diagnosis present

## 2022-07-18 DIAGNOSIS — D7589 Other specified diseases of blood and blood-forming organs: Secondary | ICD-10-CM

## 2022-07-18 DIAGNOSIS — Z888 Allergy status to other drugs, medicaments and biological substances status: Secondary | ICD-10-CM | POA: Diagnosis not present

## 2022-07-18 DIAGNOSIS — Z7982 Long term (current) use of aspirin: Secondary | ICD-10-CM | POA: Diagnosis not present

## 2022-07-18 DIAGNOSIS — Z801 Family history of malignant neoplasm of trachea, bronchus and lung: Secondary | ICD-10-CM | POA: Diagnosis not present

## 2022-07-18 DIAGNOSIS — E875 Hyperkalemia: Secondary | ICD-10-CM | POA: Diagnosis present

## 2022-07-18 DIAGNOSIS — F32A Depression, unspecified: Secondary | ICD-10-CM | POA: Diagnosis not present

## 2022-07-18 DIAGNOSIS — Z7951 Long term (current) use of inhaled steroids: Secondary | ICD-10-CM

## 2022-07-18 DIAGNOSIS — I509 Heart failure, unspecified: Secondary | ICD-10-CM | POA: Diagnosis not present

## 2022-07-18 DIAGNOSIS — E785 Hyperlipidemia, unspecified: Secondary | ICD-10-CM | POA: Diagnosis present

## 2022-07-18 DIAGNOSIS — F418 Other specified anxiety disorders: Secondary | ICD-10-CM | POA: Diagnosis present

## 2022-07-18 DIAGNOSIS — Z79890 Hormone replacement therapy (postmenopausal): Secondary | ICD-10-CM

## 2022-07-18 DIAGNOSIS — E890 Postprocedural hypothyroidism: Secondary | ICD-10-CM | POA: Diagnosis present

## 2022-07-18 DIAGNOSIS — Z87891 Personal history of nicotine dependence: Secondary | ICD-10-CM

## 2022-07-18 DIAGNOSIS — Z7952 Long term (current) use of systemic steroids: Secondary | ICD-10-CM | POA: Diagnosis not present

## 2022-07-18 DIAGNOSIS — I5033 Acute on chronic diastolic (congestive) heart failure: Secondary | ICD-10-CM | POA: Diagnosis present

## 2022-07-18 DIAGNOSIS — J9620 Acute and chronic respiratory failure, unspecified whether with hypoxia or hypercapnia: Secondary | ICD-10-CM | POA: Diagnosis present

## 2022-07-18 DIAGNOSIS — M810 Age-related osteoporosis without current pathological fracture: Secondary | ICD-10-CM | POA: Diagnosis present

## 2022-07-18 DIAGNOSIS — I959 Hypotension, unspecified: Secondary | ICD-10-CM | POA: Diagnosis not present

## 2022-07-18 DIAGNOSIS — Z8249 Family history of ischemic heart disease and other diseases of the circulatory system: Secondary | ICD-10-CM | POA: Diagnosis not present

## 2022-07-18 DIAGNOSIS — J449 Chronic obstructive pulmonary disease, unspecified: Secondary | ICD-10-CM

## 2022-07-18 DIAGNOSIS — J441 Chronic obstructive pulmonary disease with (acute) exacerbation: Secondary | ICD-10-CM

## 2022-07-18 DIAGNOSIS — Z85118 Personal history of other malignant neoplasm of bronchus and lung: Secondary | ICD-10-CM

## 2022-07-18 DIAGNOSIS — I10 Essential (primary) hypertension: Secondary | ICD-10-CM | POA: Diagnosis present

## 2022-07-18 DIAGNOSIS — Z8349 Family history of other endocrine, nutritional and metabolic diseases: Secondary | ICD-10-CM | POA: Diagnosis not present

## 2022-07-18 DIAGNOSIS — I110 Hypertensive heart disease with heart failure: Secondary | ICD-10-CM | POA: Diagnosis present

## 2022-07-18 DIAGNOSIS — Z79899 Other long term (current) drug therapy: Secondary | ICD-10-CM

## 2022-07-18 DIAGNOSIS — I491 Atrial premature depolarization: Secondary | ICD-10-CM | POA: Diagnosis not present

## 2022-07-18 DIAGNOSIS — Z9981 Dependence on supplemental oxygen: Secondary | ICD-10-CM

## 2022-07-18 DIAGNOSIS — Z92241 Personal history of systemic steroid therapy: Secondary | ICD-10-CM

## 2022-07-18 DIAGNOSIS — F419 Anxiety disorder, unspecified: Secondary | ICD-10-CM | POA: Diagnosis present

## 2022-07-18 DIAGNOSIS — Z86718 Personal history of other venous thrombosis and embolism: Secondary | ICD-10-CM

## 2022-07-18 DIAGNOSIS — R0600 Dyspnea, unspecified: Secondary | ICD-10-CM | POA: Diagnosis not present

## 2022-07-18 DIAGNOSIS — R918 Other nonspecific abnormal finding of lung field: Secondary | ICD-10-CM

## 2022-07-18 DIAGNOSIS — Z20822 Contact with and (suspected) exposure to covid-19: Secondary | ICD-10-CM | POA: Diagnosis not present

## 2022-07-18 DIAGNOSIS — J9622 Acute and chronic respiratory failure with hypercapnia: Secondary | ICD-10-CM | POA: Diagnosis present

## 2022-07-18 DIAGNOSIS — R0902 Hypoxemia: Secondary | ICD-10-CM | POA: Diagnosis not present

## 2022-07-18 DIAGNOSIS — J9692 Respiratory failure, unspecified with hypercapnia: Secondary | ICD-10-CM | POA: Diagnosis present

## 2022-07-18 DIAGNOSIS — J9621 Acute and chronic respiratory failure with hypoxia: Secondary | ICD-10-CM | POA: Diagnosis not present

## 2022-07-18 DIAGNOSIS — J90 Pleural effusion, not elsewhere classified: Secondary | ICD-10-CM | POA: Diagnosis not present

## 2022-07-18 DIAGNOSIS — Z825 Family history of asthma and other chronic lower respiratory diseases: Secondary | ICD-10-CM | POA: Diagnosis not present

## 2022-07-18 DIAGNOSIS — R069 Unspecified abnormalities of breathing: Secondary | ICD-10-CM | POA: Diagnosis not present

## 2022-07-18 LAB — BASIC METABOLIC PANEL
Anion gap: 11 (ref 5–15)
BUN: 8 mg/dL (ref 8–23)
CO2: 33 mmol/L — ABNORMAL HIGH (ref 22–32)
Calcium: 9.4 mg/dL (ref 8.9–10.3)
Chloride: 96 mmol/L — ABNORMAL LOW (ref 98–111)
Creatinine, Ser: 0.73 mg/dL (ref 0.44–1.00)
GFR, Estimated: >60 mL/min (ref >60)
Glucose, Bld: 93 mg/dL (ref 70–99)
Potassium: 3.9 mmol/L (ref 3.5–5.1)
Sodium: 140 mmol/L (ref 135–145)

## 2022-07-18 LAB — CBC WITH DIFFERENTIAL/PLATELET
Abs Immature Granulocytes: 0.02 10*3/uL (ref 0.00–0.07)
Basophils Absolute: 0 10*3/uL (ref 0.0–0.1)
Basophils Relative: 1 %
Eosinophils Absolute: 0.1 10*3/uL (ref 0.0–0.5)
Eosinophils Relative: 2 %
HCT: 43.8 % (ref 36.0–46.0)
Hemoglobin: 13.3 g/dL (ref 12.0–15.0)
Immature Granulocytes: 0 %
Lymphocytes Relative: 11 %
Lymphs Abs: 0.7 10*3/uL (ref 0.7–4.0)
MCH: 31.2 pg (ref 26.0–34.0)
MCHC: 30.4 g/dL (ref 30.0–36.0)
MCV: 102.8 fL — ABNORMAL HIGH (ref 80.0–100.0)
Monocytes Absolute: 0.6 10*3/uL (ref 0.1–1.0)
Monocytes Relative: 8 %
Neutro Abs: 5.5 10*3/uL (ref 1.7–7.7)
Neutrophils Relative %: 78 %
Platelets: 152 10*3/uL (ref 150–400)
RBC: 4.26 MIL/uL (ref 3.87–5.11)
RDW: 14.4 % (ref 11.5–15.5)
WBC: 6.9 10*3/uL (ref 4.0–10.5)
nRBC: 0 % (ref 0.0–0.2)

## 2022-07-18 LAB — D-DIMER, QUANTITATIVE: D-Dimer, Quant: <0.27 ug/mL-FEU (ref 0.00–0.50)

## 2022-07-18 LAB — I-STAT ARTERIAL BLOOD GAS, ED
Acid-Base Excess: 9 mmol/L — ABNORMAL HIGH (ref 0.0–2.0)
Bicarbonate: 38.7 mmol/L — ABNORMAL HIGH (ref 20.0–28.0)
Calcium, Ion: 1.26 mmol/L (ref 1.15–1.40)
HCT: 38 % (ref 36.0–46.0)
Hemoglobin: 12.9 g/dL (ref 12.0–15.0)
O2 Saturation: 90 %
Patient temperature: 98.4
Potassium: 3.8 mmol/L (ref 3.5–5.1)
Sodium: 137 mmol/L (ref 135–145)
TCO2: 41 mmol/L — ABNORMAL HIGH (ref 22–32)
pCO2 arterial: 79.3 mmHg (ref 32–48)
pH, Arterial: 7.296 — ABNORMAL LOW (ref 7.35–7.45)
pO2, Arterial: 69 mmHg — ABNORMAL LOW (ref 83–108)

## 2022-07-18 LAB — TROPONIN I (HIGH SENSITIVITY)
Troponin I (High Sensitivity): 10 ng/L (ref <18)
Troponin I (High Sensitivity): 10 ng/L (ref <18)

## 2022-07-18 LAB — SARS CORONAVIRUS 2 BY RT PCR: SARS Coronavirus 2 by RT PCR: NEGATIVE

## 2022-07-18 LAB — BRAIN NATRIURETIC PEPTIDE: B Natriuretic Peptide: 19.6 pg/mL (ref 0.0–100.0)

## 2022-07-18 MED ORDER — POTASSIUM CHLORIDE CRYS ER 10 MEQ PO TBCR
10.0000 meq | EXTENDED_RELEASE_TABLET | Freq: Every day | ORAL | Status: DC
  Administered 2022-07-18: 10 meq via ORAL
  Filled 2022-07-18: qty 1

## 2022-07-18 MED ORDER — LEVOTHYROXINE SODIUM 75 MCG PO TABS
75.0000 ug | ORAL_TABLET | Freq: Every day | ORAL | Status: DC
  Administered 2022-07-19 – 2022-07-21 (×3): 75 ug via ORAL
  Filled 2022-07-18 (×3): qty 1

## 2022-07-18 MED ORDER — IPRATROPIUM-ALBUTEROL 0.5-2.5 (3) MG/3ML IN SOLN
3.0000 mL | Freq: Four times a day (QID) | RESPIRATORY_TRACT | Status: DC
  Filled 2022-07-18: qty 3

## 2022-07-18 MED ORDER — DOCUSATE SODIUM 100 MG PO CAPS
100.0000 mg | ORAL_CAPSULE | Freq: Two times a day (BID) | ORAL | Status: DC
  Administered 2022-07-18 – 2022-07-21 (×7): 100 mg via ORAL
  Filled 2022-07-18 (×7): qty 1

## 2022-07-18 MED ORDER — GUAIFENESIN 100 MG/5ML PO LIQD: 5.0000 mL | ORAL | Status: DC | PRN

## 2022-07-18 MED ORDER — ARFORMOTEROL TARTRATE 15 MCG/2ML IN NEBU
15.0000 ug | INHALATION_SOLUTION | Freq: Two times a day (BID) | RESPIRATORY_TRACT | Status: DC
  Administered 2022-07-18 – 2022-07-21 (×7): 15 ug via RESPIRATORY_TRACT
  Filled 2022-07-18 (×9): qty 2

## 2022-07-18 MED ORDER — PREDNISONE 20 MG PO TABS
40.0000 mg | ORAL_TABLET | Freq: Every day | ORAL | Status: DC
  Administered 2022-07-19 – 2022-07-20 (×2): 40 mg via ORAL
  Filled 2022-07-18 (×4): qty 2

## 2022-07-18 MED ORDER — SODIUM CHLORIDE 0.9 % IV SOLN
500.0000 mg | INTRAVENOUS | Status: AC
  Administered 2022-07-18: 500 mg via INTRAVENOUS
  Filled 2022-07-18 (×2): qty 5

## 2022-07-18 MED ORDER — BISACODYL 5 MG PO TBEC: 5.0000 mg | DELAYED_RELEASE_TABLET | Freq: Every day | ORAL | Status: DC | PRN

## 2022-07-18 MED ORDER — BUDESONIDE 0.5 MG/2ML IN SUSP
0.5000 mg | Freq: Two times a day (BID) | RESPIRATORY_TRACT | Status: DC
  Administered 2022-07-18 – 2022-07-21 (×7): 0.5 mg via RESPIRATORY_TRACT
  Filled 2022-07-18 (×9): qty 2

## 2022-07-18 MED ORDER — ONDANSETRON HCL 4 MG/2ML IJ SOLN: 4.0000 mg | Freq: Four times a day (QID) | INTRAMUSCULAR | Status: DC | PRN

## 2022-07-18 MED ORDER — MONTELUKAST SODIUM 10 MG PO TABS
10.0000 mg | ORAL_TABLET | Freq: Every day | ORAL | Status: DC
  Administered 2022-07-18 – 2022-07-20 (×3): 10 mg via ORAL
  Filled 2022-07-18 (×3): qty 1

## 2022-07-18 MED ORDER — AZITHROMYCIN 250 MG PO TABS
500.0000 mg | ORAL_TABLET | Freq: Every day | ORAL | Status: DC
  Administered 2022-07-19 – 2022-07-21 (×3): 500 mg via ORAL
  Filled 2022-07-18 (×3): qty 2

## 2022-07-18 MED ORDER — IPRATROPIUM-ALBUTEROL 0.5-2.5 (3) MG/3ML IN SOLN
3.0000 mL | Freq: Once | RESPIRATORY_TRACT | Status: AC
  Administered 2022-07-18: 3 mL via RESPIRATORY_TRACT
  Filled 2022-07-18: qty 3

## 2022-07-18 MED ORDER — ATORVASTATIN CALCIUM 10 MG PO TABS
10.0000 mg | ORAL_TABLET | Freq: Every day | ORAL | Status: DC
  Administered 2022-07-18 – 2022-07-21 (×4): 10 mg via ORAL
  Filled 2022-07-18 (×5): qty 1

## 2022-07-18 MED ORDER — SODIUM CHLORIDE 0.9% FLUSH
3.0000 mL | Freq: Two times a day (BID) | INTRAVENOUS | Status: DC
  Administered 2022-07-18 – 2022-07-21 (×7): 3 mL via INTRAVENOUS

## 2022-07-18 MED ORDER — ALBUTEROL SULFATE (2.5 MG/3ML) 0.083% IN NEBU: 2.5000 mg | INHALATION_SOLUTION | RESPIRATORY_TRACT | Status: DC | PRN

## 2022-07-18 MED ORDER — CITALOPRAM HYDROBROMIDE 20 MG PO TABS
20.0000 mg | ORAL_TABLET | Freq: Every day | ORAL | Status: DC
  Administered 2022-07-18 – 2022-07-21 (×4): 20 mg via ORAL
  Filled 2022-07-18: qty 2
  Filled 2022-07-18 (×3): qty 1

## 2022-07-18 MED ORDER — ACETAMINOPHEN 650 MG RE SUPP: 650.0000 mg | Freq: Four times a day (QID) | RECTAL | Status: DC | PRN

## 2022-07-18 MED ORDER — ACETAMINOPHEN 325 MG PO TABS: 650.0000 mg | ORAL_TABLET | Freq: Four times a day (QID) | ORAL | Status: DC | PRN

## 2022-07-18 MED ORDER — FLUTICASONE PROPIONATE 50 MCG/ACT NA SUSP
1.0000 | Freq: Every day | NASAL | Status: DC
  Administered 2022-07-20 – 2022-07-21 (×2): 1 via NASAL
  Filled 2022-07-18 (×2): qty 16

## 2022-07-18 MED ORDER — FLUTICASONE FUROATE-VILANTEROL 200-25 MCG/ACT IN AEPB
1.0000 | INHALATION_SPRAY | Freq: Every day | RESPIRATORY_TRACT | Status: DC
  Filled 2022-07-18: qty 28

## 2022-07-18 MED ORDER — HYDRALAZINE HCL 20 MG/ML IJ SOLN: 5.0000 mg | INTRAMUSCULAR | Status: DC | PRN

## 2022-07-18 MED ORDER — FUROSEMIDE 10 MG/ML IJ SOLN
40.0000 mg | Freq: Once | INTRAMUSCULAR | Status: AC
  Administered 2022-07-18: 40 mg via INTRAVENOUS
  Filled 2022-07-18: qty 4

## 2022-07-18 MED ORDER — POLYETHYLENE GLYCOL 3350 17 G PO PACK: 17.0000 g | PACK | Freq: Every day | ORAL | Status: DC | PRN

## 2022-07-18 MED ORDER — ASPIRIN 81 MG PO TBEC
81.0000 mg | DELAYED_RELEASE_TABLET | Freq: Every day | ORAL | Status: DC
  Administered 2022-07-18 – 2022-07-21 (×4): 81 mg via ORAL
  Filled 2022-07-18 (×4): qty 1

## 2022-07-18 MED ORDER — METHYLPREDNISOLONE SODIUM SUCC 125 MG IJ SOLR
60.0000 mg | Freq: Two times a day (BID) | INTRAMUSCULAR | Status: AC
  Administered 2022-07-18 (×2): 60 mg via INTRAVENOUS
  Filled 2022-07-18 (×2): qty 2

## 2022-07-18 MED ORDER — REVEFENACIN 175 MCG/3ML IN SOLN
175.0000 ug | Freq: Every day | RESPIRATORY_TRACT | Status: DC
  Administered 2022-07-18 – 2022-07-21 (×4): 175 ug via RESPIRATORY_TRACT
  Filled 2022-07-18 (×6): qty 3

## 2022-07-18 MED ORDER — ZOLPIDEM TARTRATE 5 MG PO TABS: 5.0000 mg | ORAL_TABLET | Freq: Every evening | ORAL | Status: DC | PRN

## 2022-07-18 MED ORDER — ONDANSETRON HCL 4 MG PO TABS: 4.0000 mg | ORAL_TABLET | Freq: Four times a day (QID) | ORAL | Status: DC | PRN

## 2022-07-18 MED ORDER — ENOXAPARIN SODIUM 40 MG/0.4ML IJ SOSY
40.0000 mg | PREFILLED_SYRINGE | INTRAMUSCULAR | Status: DC
  Administered 2022-07-18 – 2022-07-21 (×4): 40 mg via SUBCUTANEOUS
  Filled 2022-07-18 (×4): qty 0.4

## 2022-07-18 MED ORDER — LORATADINE 10 MG PO TABS
10.0000 mg | ORAL_TABLET | Freq: Every day | ORAL | Status: DC
  Administered 2022-07-18 – 2022-07-21 (×4): 10 mg via ORAL
  Filled 2022-07-18 (×4): qty 1

## 2022-07-18 MED ORDER — UMECLIDINIUM BROMIDE 62.5 MCG/ACT IN AEPB
1.0000 | INHALATION_SPRAY | Freq: Every day | RESPIRATORY_TRACT | Status: DC
  Filled 2022-07-18: qty 7

## 2022-07-18 MED ORDER — FUROSEMIDE 20 MG PO TABS
20.0000 mg | ORAL_TABLET | Freq: Every day | ORAL | Status: DC
  Administered 2022-07-18 – 2022-07-20 (×3): 20 mg via ORAL
  Filled 2022-07-18 (×3): qty 1

## 2022-07-18 NOTE — ED Notes (Signed)
Pt was calling out for help.  When I entered room she was standing up needing use restroom.  I grabbed an  02tank and ambulated her across hall to bathroom.  She was SOB but carried on a conversation with me the whole time.  We got back to room and she was 80% when back on pulse ox.  PT was still carrying on conversation.  After maybe about 1 min on 6L she rebounded into 90's. PT is now 94% on 3L.

## 2022-07-18 NOTE — ED Provider Notes (Signed)
[facility] EMERGENCY DEPARTMENT Provider Note   CSN: [id number] Arrival date & time: 07/18/22  4967     History  Chief complaint: Shortness of breath  Jasmine [name] is a 76 y.o. female.  The history is provided by the patient.  She has history of hypertension, diabetes, COPD, diastolic heart failure and was brought in by ambulance because of shortness of breath.  She noted that she was more short of breath than normal when she went to bed.  She did try using her albuterol inhaler, but did not get any relief.  She states that she intermittently gets chest pains, but did not have any chest pain tonight.  She denies any cough or fever.  Fire rescue reported initial oxygen saturation of 54% on 4 L oxygen via nasal cannula.  EMS gave methylprednisolone and 2 nebulizer treatments with albuterol and ipratropium.   Home Medications Prior to Admission medications   Medication Sig Start Date End Date Taking? Authorizing Provider  acetaminophen (TYLENOL) 325 MG tablet Take 2 tablets (650 mg total) by mouth every 6 (six) hours as needed for mild pain (or Fever >/= 101). 05/01/22   Georgette Shell, MD  albuterol (VENTOLIN HFA) 108 (90 Base) MCG/ACT inhaler INHALE 1 TO 2 PUFFS INTO THE LUNGS EVERY 6 HOURS AS NEEDED FOR WHEEZING OR SHORTNESS OF BREATH 04/14/22   Biagio Borg, MD  aspirin EC 81 MG EC tablet Take 1 tablet (81 mg total) by mouth daily. 03/27/16   Velvet Bathe, MD  atorvastatin (LIPITOR) 10 MG tablet TAKE 1 TABLET BY MOUTH DAILY. Patient taking differently: Take 10 mg by mouth daily. 07/12/21   Biagio Borg, MD  cefdinir (OMNICEF) 300 MG capsule Take 1 capsule (300 mg total) by mouth 2 (two) times daily. 05/01/22   Georgette Shell, MD  citalopram (CELEXA) 20 MG tablet TAKE ONE TABLET BY MOUTH DAILY Patient taking differently: Take 20 mg by mouth daily. TAKE ONE TABLET BY MOUTH DAILY 04/19/21   Biagio Borg, MD  fluticasone [facility]) 50 MCG/ACT nasal spray Place 1  spray into both nostrils daily. 05/02/22   Georgette Shell, MD  Fluticasone-Umeclidin-Vilant (TRELEGY ELLIPTA) 200-62.5-25 MCG/ACT AEPB INHALE 1 PUFF INTO THE LUNGS DAILY. 05/21/22   Biagio Borg, MD  furosemide (LASIX) 20 MG tablet Take 1 tablet (20 mg total) by mouth daily. 05/01/22 05/01/23  Georgette Shell, MD  guaiFENesin (ROBITUSSIN) 100 MG/5ML liquid Take 5 mLs by mouth every 4 (four) hours as needed for cough or to loosen phlegm. Mucinex chest congestion (liquid)    [provider]  ipratropium-albuterol (DUONEB) 0.5-2.5 (3) MG/3ML SOLN Take 3 mLs by nebulization every 6 (six) hours as needed. Patient taking differently: Take 3 mLs by nebulization every 6 (six) hours as needed (for shortness of breath). 11/05/21   Rigoberto Noel, MD  levothyroxine (SYNTHROID) 75 MCG tablet Take 1 tablet (75 mcg total) by mouth daily before breakfast. TAKE ONE TABLET BY MOUTH EARLY MORNING 02/26/22   Shamleffer, Melanie Crazier, MD  loratadine (CLARITIN) 10 MG tablet Take 1 tablet (10 mg total) by mouth daily. 05/02/22   Georgette Shell, MD  OXYGEN Inhale 3 L into the lungs continuous. 24/7    [provider]  potassium chloride (KLOR-CON M) 10 MEQ tablet Take 1 tablet (10 mEq total) by mouth daily. 06/30/22   Biagio Borg, MD  predniSONE (DELTASONE) 5 MG tablet Take 1 tablet (5 mg total) by mouth daily with breakfast.  05/13/22   Parrett, Fonnie Mu, NP      Allergies    Alendronate sodium    Review of Systems   Review of Systems  All other systems reviewed and are negative.   Physical Exam Updated Vital Signs Pulse 98   Temp 98.4 F (36.9 C) (Oral)   Resp (!) 22   SpO2 100%  Physical Exam Vitals and nursing note reviewed.   76 year old female, resting comfortably and in no acute distress. Vital signs are normal. Oxygen saturation is 100%, which is normal. Head is normocephalic and atraumatic. PERRLA, EOMI. Oropharynx is clear. Neck is nontender and supple without  adenopathy. JVD is present at 90 degrees. Back is nontender and there is no CVA tenderness.  There is 1+ presacral edema. Lungs have rales about halfway up, and high-pitched wheezes present in the apices. Chest is nontender. Heart has regular rate and rhythm without murmur. Abdomen is soft, flat, nontender. Extremities have 1-2+ pretibial edema, full range of motion is present. Skin is warm and dry without rash. Neurologic: Mental status is normal, cranial nerves are intact, moves all extremities equally.  ED Results / Procedures / Treatments   Labs (all labs ordered are listed, but only abnormal results are displayed) Labs Reviewed  BASIC METABOLIC PANEL - Abnormal; Notable for the following components:      Result Value   Chloride 96 (*)    CO2 33 (*)    All other components within normal limits  CBC WITH DIFFERENTIAL/PLATELET - Abnormal; Notable for the following components:   MCV 102.8 (*)    All other components within normal limits  I-STAT ARTERIAL BLOOD GAS, ED - Abnormal; Notable for the following components:   pH, Arterial 7.296 (*)    pCO2 arterial 79.3 (*)    pO2, Arterial 69 (*)    Bicarbonate 38.7 (*)    TCO2 41 (*)    Acid-Base Excess 9.0 (*)    All other components within normal limits  SARS CORONAVIRUS 2 BY RT PCR  BRAIN NATRIURETIC PEPTIDE  D-DIMER, QUANTITATIVE  TROPONIN I (HIGH SENSITIVITY)  TROPONIN I (HIGH SENSITIVITY)    EKG EKG Interpretation  Date/Time:  Friday July 18 2022 02:49:42 EDT Ventricular Rate:  81 PR Interval:  156 QRS Duration: 85 QT Interval:  352 QTC Calculation: 409 R Axis:   89 Text Interpretation: Sinus rhythm Multiple premature complexes, vent & supraven Right atrial enlargement Borderline right axis deviation Consider left ventricular hypertrophy When compared with ECG of 04/27/2022, No significant change was found Confirmed by Delora Fuel ([id number]) on 07/18/2022 2:56:13 AM  Radiology DG Chest Port 1 View  Result Date:  07/18/2022 CLINICAL DATA:  Dyspnea EXAM: PORTABLE CHEST 1 VIEW COMPARISON:  04/27/2022 FINDINGS: Stable parenchymal scarring within the right mid lung zone. Interval development of a small left pleural effusion. There is progressive cephalization of the pulmonary vasculature and development of trace interstitial pulmonary infiltrate in keeping with developing mild cardiogenic failure or volume overload. No pneumothorax. Cardiac size within normal limits. No acute bone abnormality. IMPRESSION: Interval development of mild cardiogenic failure or volume overload. Electronically Signed   By: Fidela Salisbury M.D.   On: 07/18/2022 03:24    Procedures Procedures  Cardiac monitor shows normal sinus rhythm, per my interpretation.  Medications Ordered in ED Medications  ipratropium-albuterol (DUONEB) 0.5-2.5 (3) MG/3ML nebulizer solution 3 mL (3 mLs Nebulization Given 07/18/22 0320)  furosemide (LASIX) injection 40 mg (40 mg Intravenous Given 07/18/22 7425)    ED Course/  Medical Decision Making/ A&P                           Medical Decision Making Amount and/or Complexity of Data Reviewed Labs: ordered. Radiology: ordered.  Risk Prescription drug management. Decision regarding hospitalization.   Shortness of breath which is probably multifactorial.  She has both COPD and heart failure and has some findings of both on exam.  No fever or cough to suggest pneumonia.  No risk factors for pulmonary embolism.  I have reviewed and interpreted her electrocardiogram and my interpretation is left ventricular hypertrophy with some secondary repolarization changes but not significantly changed from prior.  I have reviewed her chest x-ray and it appears to show cardiomegaly and some slight pulmonary vascular congestion with final interpretation by radiologist pending.  I have ordered an additional nebulizer treatment with albuterol and ipratropium.  I have ordered laboratory testing of CBC, basic metabolic panel,  troponin x2, BNP.  On review of old records, she was admitted to the hospital on 04/27/2022 for COPD exacerbation.  Arterial blood gas on 04/27/2022 showed PaCO2 of 105 with pH of 7.28.  She is at risk for further CO2 retention and I have ordered an arterial blood gas to evaluate for this.  Echocardiogram on 04/29/2022 showed normal ejection fraction and grade 1 diastolic dysfunction.  I have reviewed and interpreted her arterial blood gas, and my interpretation is acute on chronic respiratory acidosis with PCO2 of 79.6 and pH of 7.294.  I have requested her oxygen be reduced to maintain oxygen saturation of 90-94%.  Radiologist agrees that chest x-ray shows changes of heart failure superimposed on COPD.  I have reviewed and interpreted her other laboratory tests, and my interpretation is metabolic alkalosis which is felt to be secondary to chronic respiratory acidosis, macrocytosis without anemia, normal troponin and normal BNP.  In spite of normal BNP, patient's exam and x-ray favor at least some component of heart failure contributing to her acute decompensation.  I have ordered a dose of furosemide.  She will need to be observed carefully to make sure that she does not develop significant respiratory acidosis, so I do not feel she is safe for discharge.  Case is discussed with Dr. Wyline Copas [facility], who agrees to admit the patient.  CRITICAL CARE Performed by: Delora Fuel Total critical care time: 60 minutes Critical care time was exclusive of separately billable procedures and treating other patients. Critical care was necessary to treat or prevent imminent or life-threatening deterioration. Critical care was time spent personally by me on the following activities: development of treatment plan with patient and/or surrogate as well as nursing, discussions with consultants, evaluation of patient's response to treatment, examination of patient, obtaining history from patient or surrogate, ordering and  performing treatments and interventions, ordering and review of laboratory studies, ordering and review of radiographic studies, pulse oximetry and re-evaluation of patient's condition.  Final Clinical Impression(s) / ED Diagnoses Final diagnoses:  Acute on chronic respiratory failure with hypoxia and hypercapnia (HCC)  COPD exacerbation (HCC)  Acute on chronic diastolic heart failure (HCC)  Macrocytosis without anemia    Rx / DC Orders ED Discharge Orders     None         Delora Fuel, MD 64/40/34 [phone]

## 2022-07-18 NOTE — Telephone Encounter (Signed)
Please schedule patient for hospital follow up in 7-10 days for COPD exacerbation with Rexene Edison, NP or Dr. Elsworth Soho.  Thanks, JD

## 2022-07-18 NOTE — Telephone Encounter (Signed)
Patient scheduled 10/12 at 9:30am with Dr. Elsworth Soho due to availability. Reminder sent to address on file- nothing further needed.

## 2022-07-18 NOTE — ED Triage Notes (Signed)
BIB [facility] from home. 0130 called ems for worsening SOB- rescue inhaler no help. Fire reported sat of 54% on 4LPM via [place] (her baseline). No fever. Reports some CP centralized. HX COPD.  EMS enroute 2 duo nebs Solumedrol

## 2022-07-18 NOTE — Consult Note (Signed)
NAME:  SAKURA DENIS, MRN:  [id number], DOB:  18-Nov-1945, LOS: 0 ADMISSION DATE:  07/18/2022, CONSULTATION DATE:  07/18/22 REFERRING MD:  Lorin Mercy, MD CHIEF COMPLAINT:  Shortness of breath   History of Present Illness:  Zoriyah Scheidegger is a 76 year old woman, former smoker with COPD (FEV1 30%), chronic hypoxemic and hypercapnic respiratory failure on home O2 and nocturnal NIV who is admitted to the hospital with acute on chronic respiratory failure due to COPD exacerbation.  PCCM consulted for further evaluation and management.   She called EMS due to progressive dyspnea that did not respond to her rescue inhaler. Her SpO2 was 54% on 4L upon arrival by EMS. She was given steroids and nebulizer treatments. In the ER, there was concern for some pulmonary edema and was given lasix. She was started on solumedrol, nebs and azithromycin for her COPD exacerbation.   Patient is feeling much better at time of interview. She reports progressive dyspnea over the past week. She reports her allergies seem to be bothering her and she has not had her house cleaned in a while so it has become very dusty. She is not taking any allergy medicine or montelukast at this time.   Pertinent  Medical History   Past Medical History:  Diagnosis Date   ALLERGIC RHINITIS 05/01/2007   Anxiety    Arthritis    "hands" (12/28/2015)   ASTHMA 05/01/2007   "since I was a child"   Cancer ([place])    LUNG   CHF (congestive heart failure) (HCC)    Chronic bronchitis (HCC)    COPD (chronic obstructive pulmonary disease) ([place])    DEPRESSION 07/21/2008   DVT (deep venous thrombosis) ([place]) 1960s   LLE   FATIGUE 10/10/2010   HYPERTENSION 05/01/2007   HYPOTHYROIDISM 08/23/2009   s/p RAI   LUMBAR RADICULOPATHY, RIGHT 08/25/2008   On home oxygen therapy    "3-4L; qd; all the time" (12/28/2015)   OSTEOPOROSIS 05/01/2007   SHOULDER PAIN, LEFT 08/23/2009   SINUSITIS, CHRONIC 10/10/2010   Significant Hospital Events: Including procedures,  antibiotic start and stop dates in addition to other pertinent events     Interim History / Subjective:  As above  Objective   Blood pressure 118/62, pulse 93, temperature 98.2 F (36.8 C), temperature source Oral, resp. rate 19, height '5\' 1"'$  (1.549 m), weight 54.4 kg, SpO2 94 %.       No intake or output data in the 24 hours ending 07/18/22 1059 Filed Weights   07/18/22 0325  Weight: 54.4 kg    Examination: General: chronically ill woman, mild respiratory distress HENT: [place]/AT, sclera anicteric, moist mucous membranes Lungs: diminished breath sounds, wheezing left upper lung field Cardiovascular: RRR, no murmurs Abdomen: soft, non-tender, non-distended, BS+ Extremities: warm, no edema Neuro: alert, oriented x 3, following all commands GU: n/a  Resolved Hospital Problem list     Assessment & Plan:  COPD/Centrilobular Emphysema with acute exacerbation Acute on Chronic Hypoxemic/Hypercapnic respiratory failure Protein Calorie Malnutrition, Severe Seasonal Allergies  - Agree with solumedrol '60mg'$  BID and starting prednisone '40mg'$  daily tomorrow for 4 days followed by prednisone '30mg'$  daily x 3 days, '20mg'$  daily x 3 days, '10mg'$  daily x 3 days then off. - Agree with 5 days of azithromycin - Start budesonide/yupelri/brovana nebulizer treatments while hospital. Can transition back to trelegy inhaler upon discharge. Will consider if she needs transition to long acting nebulizer treatments rather than inhaler therapy in clinic follow up.  - Start montelukast '10mg'$  daily and  loratidine '10mg'$  daily for allergies. - Continue NIV support at night when sleeping. - Will arrange for follow up in clinic in the next 7-10 days.   PCCM will sign off. Please call with any further questions.  Best Practice (right click and "Reselect all SmartList Selections" daily)   Per primary  Labs   CBC: Recent Labs  Lab 07/18/22 0325 07/18/22 0400  WBC  --  6.9  NEUTROABS  --  5.5  HGB 12.9 13.3  HCT  38.0 43.8  MCV  --  102.8*  PLT  --  371    Basic Metabolic Panel: Recent Labs  Lab 07/18/22 0325 07/18/22 0400  NA 137 140  K 3.8 3.9  CL  --  96*  CO2  --  33*  GLUCOSE  --  93  BUN  --  8  CREATININE  --  0.73  CALCIUM  --  9.4   GFR: Estimated Creatinine Clearance: 44.4 mL/min (by C-G formula based on SCr of 0.73 mg/dL). Recent Labs  Lab 07/18/22 0400  WBC 6.9    Liver Function Tests: No results for input(s): "AST", "ALT", "ALKPHOS", "BILITOT", "PROT", "ALBUMIN" in the last 168 hours. No results for input(s): "LIPASE", "AMYLASE" in the last 168 hours. No results for input(s): "AMMONIA" in the last 168 hours.  ABG    Component Value Date/Time   PHART 7.296 (L) 07/18/2022 0325   PCO2ART 79.3 (HH) 07/18/2022 0325   PO2ART 69 (L) 07/18/2022 0325   HCO3 38.7 (H) 07/18/2022 0325   TCO2 41 (H) 07/18/2022 0325   O2SAT 90 07/18/2022 0325     Coagulation Profile: No results for input(s): "INR", "PROTIME" in the last 168 hours.  Cardiac Enzymes: No results for input(s): "CKTOTAL", "CKMB", "CKMBINDEX", "TROPONINI" in the last 168 hours.  HbA1C: Hemoglobin A1C  Date/Time Value Ref Range Status  02/25/2022 03:32 PM 6.5 (A) 4.0 - 5.6 % Final   Hgb A1c MFr Bld  Date/Time Value Ref Range Status  04/19/2021 03:51 PM 6.3 4.6 - 6.5 % Final    Comment:    Glycemic Control Guidelines for People with Diabetes:Non Diabetic:  <6%Goal of Therapy: <7%Additional Action Suggested:  >8%   06/08/2020 03:55 PM 6.6 (H) <5.7 % of total Hgb Final    Comment:    For someone without known diabetes, a hemoglobin A1c value of 6.5% or greater indicates that they may have  diabetes and this should be confirmed with a follow-up  test. . For someone with known diabetes, a value <7% indicates  that their diabetes is well controlled and a value  greater than or equal to 7% indicates suboptimal  control. A1c targets should be individualized based on  duration of diabetes, age, comorbid  conditions, and  other considerations. . Currently, no consensus exists regarding use of hemoglobin A1c for diagnosis of diabetes for children. .     CBG: No results for input(s): "GLUCAP" in the last 168 hours.  Review of Systems:   Review of Systems  Constitutional:  Negative for chills, fever, malaise/fatigue and weight loss.  HENT:  Negative for congestion, sinus pain and sore throat.   Eyes: Negative.   Respiratory:  Positive for cough, shortness of breath and wheezing. Negative for hemoptysis and sputum production.   Cardiovascular:  Negative for chest pain, palpitations, orthopnea, claudication and leg swelling.  Gastrointestinal:  Negative for abdominal pain, heartburn, nausea and vomiting.  Genitourinary: Negative.   Musculoskeletal:  Negative for joint pain and myalgias.  Skin:  Negative for rash.  Neurological:  Negative for weakness.  Endo/Heme/Allergies:  Positive for environmental allergies.  Psychiatric/Behavioral: Negative.     Past Medical History:  She,  has a past medical history of ALLERGIC RHINITIS (05/01/2007), Anxiety, Arthritis, ASTHMA (05/01/2007), Cancer ([place]), CHF (congestive heart failure) ([place]), Chronic bronchitis ([place]), COPD (chronic obstructive pulmonary disease) ([place]), DEPRESSION (07/21/2008), DVT (deep venous thrombosis) ([place]) (1960s), FATIGUE (10/10/2010), HYPERTENSION (05/01/2007), HYPOTHYROIDISM (08/23/2009), LUMBAR RADICULOPATHY, RIGHT (08/25/2008), On home oxygen therapy, OSTEOPOROSIS (05/01/2007), SHOULDER PAIN, LEFT (08/23/2009), and SINUSITIS, CHRONIC (10/10/2010).   Surgical History:   Past Surgical History:  Procedure Laterality Date   APPENDECTOMY     DILATION AND CURETTAGE OF UTERUS  multiple   history of multiple dialations and curettages and miscarriages, unfortunately never carrying a child to term   ECTOPIC PREGNANCY SURGERY  "late '60s or early '70s   ELECTROCARDIOGRAM  06/20/2006   OOPHORECTOMY Right      Social History:   reports  that she quit smoking about 5 years ago. Her smoking use included cigarettes. She started smoking about 58 years ago. She has a 52.00 pack-year smoking history. She has never used smokeless tobacco. She reports that she does not currently use alcohol. She reports that she does not currently use drugs.   Family History:  Her family history includes Alcohol abuse in her brother; Asthma in an other family member; Diabetes in her brother; Hypertension in an other family member; Lung cancer in her father; Stroke in her mother; Thyroid disease in her mother. There is no history of Breast cancer.   Allergies Allergies  Allergen Reactions   Alendronate Sodium Other (See Comments)    Patient does not remember this reaction     Home Medications  Prior to Admission medications   Medication Sig Start Date End Date Taking? Authorizing Provider  acetaminophen (TYLENOL) 325 MG tablet Take 2 tablets (650 mg total) by mouth every 6 (six) hours as needed for mild pain (or Fever >/= 101). 05/01/22  Yes Georgette Shell, MD  albuterol (VENTOLIN HFA) 108 (90 Base) MCG/ACT inhaler INHALE 1 TO 2 PUFFS INTO THE LUNGS EVERY 6 HOURS AS NEEDED FOR WHEEZING OR SHORTNESS OF BREATH Patient taking differently: Inhale 2 puffs into the lungs every 6 (six) hours as needed for wheezing or shortness of breath. 04/14/22  Yes Biagio Borg, MD  aspirin EC 81 MG EC tablet Take 1 tablet (81 mg total) by mouth daily. 03/27/16  Yes Velvet Bathe, MD  atorvastatin (LIPITOR) 10 MG tablet TAKE 1 TABLET BY MOUTH DAILY. Patient taking differently: Take 10 mg by mouth daily. 07/12/21  Yes Biagio Borg, MD  citalopram (CELEXA) 20 MG tablet TAKE ONE TABLET BY MOUTH DAILY Patient taking differently: Take 20 mg by mouth daily. TAKE ONE TABLET BY MOUTH DAILY 04/19/21  Yes Biagio Borg, MD  fluticasone [facility]) 50 MCG/ACT nasal spray Place 1 spray into both nostrils daily. 05/02/22  Yes Georgette Shell, MD  Fluticasone-Umeclidin-Vilant  (TRELEGY ELLIPTA) 200-62.5-25 MCG/ACT AEPB INHALE 1 PUFF INTO THE LUNGS DAILY. 05/21/22  Yes Biagio Borg, MD  furosemide (LASIX) 20 MG tablet Take 1 tablet (20 mg total) by mouth daily. 05/01/22 05/01/23 Yes Georgette Shell, MD  guaiFENesin (ROBITUSSIN) 100 MG/5ML liquid Take 5 mLs by mouth every 4 (four) hours as needed for cough or to loosen phlegm. Mucinex chest congestion (liquid)   Yes [provider]  ipratropium-albuterol (DUONEB) 0.5-2.5 (3) MG/3ML SOLN Take 3 mLs by nebulization every 6 (six) hours  as needed. Patient taking differently: Take 3 mLs by nebulization every 6 (six) hours as needed (for shortness of breath). 11/05/21  Yes Rigoberto Noel, MD  levothyroxine (SYNTHROID) 75 MCG tablet Take 1 tablet (75 mcg total) by mouth daily before breakfast. TAKE ONE TABLET BY MOUTH EARLY MORNING Patient taking differently: Take 75 mcg by mouth daily before breakfast. 02/26/22  Yes Shamleffer, Melanie Crazier, MD  loratadine (CLARITIN) 10 MG tablet Take 1 tablet (10 mg total) by mouth daily. Patient taking differently: Take 10 mg by mouth daily as needed for allergies. 05/02/22  Yes Georgette Shell, MD  potassium chloride (KLOR-CON M) 10 MEQ tablet Take 1 tablet (10 mEq total) by mouth daily. 06/30/22  Yes Biagio Borg, MD  predniSONE (DELTASONE) 5 MG tablet Take 1 tablet (5 mg total) by mouth daily with breakfast. 05/13/22  Yes Parrett, Tammy S, NP  OXYGEN Inhale 3 L into the lungs continuous. 24/7    [provider]     Critical care time: n/a    Freda Jackson, MD [place] Office: [phone]   See Amion for personal pager PCCM on call pager [phone] until 7pm. Please call Elink 7p-7a. [phone]

## 2022-07-18 NOTE — ED Notes (Signed)
Dr. Yates at bedside.  

## 2022-07-18 NOTE — Progress Notes (Signed)
   07/18/22 2200  BiPAP/CPAP/SIPAP  $ Non-Invasive Home Ventilator  Initial  $ Face Mask Medium Yes  BiPAP/CPAP/SIPAP Pt Type Adult  Mask Type Full face mask  Mask Size Medium  Respiratory Rate 21 breaths/min  Oxygen Percent  (5L)  BiPAP/CPAP/SIPAP BiPAP  Patient Home Equipment No  Auto Titrate Yes  BiPAP/CPAP /SiPAP Vitals  Pulse Rate 83  Resp (!) 21  SpO2 91 %  MEWS Score/Color  MEWS Score 1  MEWS Score Color Green   Pt placed on BiPAP for rest per MD order. Auto titration with 5L bleed in. Pt tolerating well at this time, RT will continue to monitor.

## 2022-07-18 NOTE — H&P (Signed)
History and Physical    Patient: Jasmine [name] OLM:[id number] DOB: 1946-03-09 DOA: 07/18/2022 DOS: the patient was seen and examined on 07/18/2022 PCP: Biagio Borg, MD  Patient coming from: Home - lives with ex-husband, [name]; NOK: [name], [phone]   Chief Complaint: SOB  HPI: Jasmine [name] is a 76 y.o. female with medical history significant of lung cancer; chronic diastolic CHF; COPD on 2L home O2; HTN; and hypothyroidism presenting with SOB.  She reports that she has COPD and had difficulty with her breathing.  When she started to go to bed last night, her breathing was not as it should be.  EMS came in and she was doing ok but EMS suggested that she come in.  She didn't feel well during the day yesterday, felt like she was "going down".  No orthopnea.  No change in cough.  She felt heavy in the chest because of her breathing.  She wears a Trilogy at night.  No fever.  No sick contacts.  No change in LE edema.    ER Course:  Carryover, per Dr. Wyline Copas:  Hx COPD and hx dCHF with worsened sob. Baseline 2LNC, needing up to [facility]. CXR looks wet, received lasix in ED, however BNP is low. Pt is also chronic retainer, pCO2 in the 70's. Basically looks more like chf at this point. Orders to progressive. COVID pending     Review of Systems: As mentioned in the history of present illness. All other systems reviewed and are negative. Past Medical History:  Diagnosis Date   ALLERGIC RHINITIS 05/01/2007   Anxiety    Arthritis    "hands" (12/28/2015)   ASTHMA 05/01/2007   "since I was a child"   Cancer ([place])    LUNG   CHF (congestive heart failure) ([place])    Chronic bronchitis (HCC)    COPD (chronic obstructive pulmonary disease) ([place])    DEPRESSION 07/21/2008   DVT (deep venous thrombosis) ([place]) 1960s   LLE   FATIGUE 10/10/2010   HYPERTENSION 05/01/2007   HYPOTHYROIDISM 08/23/2009   s/p RAI   LUMBAR RADICULOPATHY, RIGHT 08/25/2008   On home oxygen therapy    "3-4L; qd; all the  time" (12/28/2015)   OSTEOPOROSIS 05/01/2007   SHOULDER PAIN, LEFT 08/23/2009   SINUSITIS, CHRONIC 10/10/2010   Past Surgical History:  Procedure Laterality Date   APPENDECTOMY     DILATION AND CURETTAGE OF UTERUS  multiple   history of multiple dialations and curettages and miscarriages, unfortunately never carrying a child to term   ECTOPIC PREGNANCY SURGERY  "late '60s or early '70s   ELECTROCARDIOGRAM  06/20/2006   OOPHORECTOMY Right    Social History:  reports that she quit smoking about 5 years ago. Her smoking use included cigarettes. She started smoking about 58 years ago. She has a 52.00 pack-year smoking history. She has never used smokeless tobacco. She reports that she does not currently use alcohol. She reports that she does not currently use drugs.  Allergies  Allergen Reactions   Alendronate Sodium Other (See Comments)    Patient does not remember this reaction    Family History  Problem Relation Age of Onset   Lung cancer Father    Alcohol abuse Brother    Diabetes Brother    Hypertension Other    Stroke Mother    Thyroid disease Mother    Asthma Other        maternal aunts   Breast cancer Neg Hx     Prior  to Admission medications   Medication Sig Start Date End Date Taking? Authorizing Provider  atorvastatin (LIPITOR) 10 MG tablet TAKE 1 TABLET BY MOUTH DAILY. Patient taking differently: Take 10 mg by mouth daily. 07/12/21  Yes Biagio Borg, MD  acetaminophen (TYLENOL) 325 MG tablet Take 2 tablets (650 mg total) by mouth every 6 (six) hours as needed for mild pain (or Fever >/= 101). 05/01/22   Georgette Shell, MD  albuterol (VENTOLIN HFA) 108 (90 Base) MCG/ACT inhaler INHALE 1 TO 2 PUFFS INTO THE LUNGS EVERY 6 HOURS AS NEEDED FOR WHEEZING OR SHORTNESS OF BREATH Patient taking differently: Inhale 2 puffs into the lungs every 6 (six) hours as needed for wheezing or shortness of breath. 04/14/22   Biagio Borg, MD  aspirin EC 81 MG EC tablet Take 1 tablet (81  mg total) by mouth daily. 03/27/16   Velvet Bathe, MD  cefdinir (OMNICEF) 300 MG capsule Take 1 capsule (300 mg total) by mouth 2 (two) times daily. 05/01/22   Georgette Shell, MD  citalopram (CELEXA) 20 MG tablet TAKE ONE TABLET BY MOUTH DAILY Patient taking differently: Take 20 mg by mouth daily. TAKE ONE TABLET BY MOUTH DAILY 04/19/21   Biagio Borg, MD  fluticasone [facility]) 50 MCG/ACT nasal spray Place 1 spray into both nostrils daily. 05/02/22   Georgette Shell, MD  Fluticasone-Umeclidin-Vilant (TRELEGY ELLIPTA) 200-62.5-25 MCG/ACT AEPB INHALE 1 PUFF INTO THE LUNGS DAILY. 05/21/22   Biagio Borg, MD  furosemide (LASIX) 20 MG tablet Take 1 tablet (20 mg total) by mouth daily. 05/01/22 05/01/23  Georgette Shell, MD  guaiFENesin (ROBITUSSIN) 100 MG/5ML liquid Take 5 mLs by mouth every 4 (four) hours as needed for cough or to loosen phlegm. Mucinex chest congestion (liquid)    [provider]  ipratropium-albuterol (DUONEB) 0.5-2.5 (3) MG/3ML SOLN Take 3 mLs by nebulization every 6 (six) hours as needed. Patient taking differently: Take 3 mLs by nebulization every 6 (six) hours as needed (for shortness of breath). 11/05/21   Rigoberto Noel, MD  levothyroxine (SYNTHROID) 75 MCG tablet Take 1 tablet (75 mcg total) by mouth daily before breakfast. TAKE ONE TABLET BY MOUTH EARLY MORNING Patient taking differently: Take 75 mcg by mouth daily before breakfast. 02/26/22   Shamleffer, Melanie Crazier, MD  loratadine (CLARITIN) 10 MG tablet Take 1 tablet (10 mg total) by mouth daily. 05/02/22   Georgette Shell, MD  OXYGEN Inhale 3 L into the lungs continuous. 24/7    [provider]  potassium chloride (KLOR-CON M) 10 MEQ tablet Take 1 tablet (10 mEq total) by mouth daily. 06/30/22   Biagio Borg, MD  predniSONE (DELTASONE) 5 MG tablet Take 1 tablet (5 mg total) by mouth daily with breakfast. 05/13/22   Parrett, Fonnie Mu, NP    Physical Exam: Vitals:   07/18/22 1500 07/18/22  1515 07/18/22 1617 07/18/22 1712  BP: 135/63 (!) 119/56  113/64  Pulse: (!) 101 97  99  Resp: '19 15  18  '$ Temp:   98.1 F (36.7 C) 98.5 F (36.9 C)  TempSrc:   Oral Oral  SpO2: 94% 95%  91%  Weight:    55.1 kg  Height:    '5\' 1"'$  (1.549 m)   General:  Appears chronically ill, NAD Eyes:  EOMI, normal lids, iris ENT:  grossly normal hearing, lips & tongue, mmm Neck:  no LAD, masses or thyromegaly Cardiovascular:  RRR, no m/r/g. No LE edema.  Respiratory:  Generally poor air movement.  Mildly increased respiratory effort. Abdomen:  soft, NT, ND Skin:  no rash or induration seen on limited exam Musculoskeletal:  grossly normal tone BUE/BLE, good ROM, no bony abnormality Psychiatric:  blunted mood and affect, speech fluent and appropriate, AOx3 Neurologic:  CN 2-12 grossly intact, moves all extremities in coordinated fashion   Radiological Exams on Admission: Independently reviewed - see discussion in A/P where applicable  DG Chest Port 1 View  Result Date: 07/18/2022 CLINICAL DATA:  Dyspnea EXAM: PORTABLE CHEST 1 VIEW COMPARISON:  04/27/2022 FINDINGS: Stable parenchymal scarring within the right mid lung zone. Interval development of a small left pleural effusion. There is progressive cephalization of the pulmonary vasculature and development of trace interstitial pulmonary infiltrate in keeping with developing mild cardiogenic failure or volume overload. No pneumothorax. Cardiac size within normal limits. No acute bone abnormality. IMPRESSION: Interval development of mild cardiogenic failure or volume overload. Electronically Signed   By: Fidela Salisbury M.D.   On: 07/18/2022 03:24    EKG: Independently reviewed.  NSR with rate 81; nonspecific ST changes with no evidence of acute ischemia. NSCSLT   Labs on Admission: I have personally reviewed the available labs and imaging studies at the time of the admission.  Pertinent labs:    ABG: 7.296/79.3/69/38.7; 7.47/73/5/53.1 on 7/11 CO2  33 BNP 19.6 HS troponin 10 Unremarkable CBC D-dimer negative   Assessment and Plan: Principal Problem:   COPD with acute exacerbation ([place]) Active Problems:   Depression with anxiety   Essential hypertension   Acute on chronic respiratory failure (HCC)   Respiratory failure with hypercapnia (HCC)   Steroid-dependent chronic obstructive pulmonary disease (HCC)   Postablative hypothyroidism    Acute on chronic respiratory failure associated with a COPD exacerbation -Patient's shortness of breath is most likely caused by acute COPD exacerbation.  -She has history of O2-dependent COPD and wears nocturnal Trilogy -She sees pulmonology, will consult -She does not have fever or leukocytosis. -ABG shows chronic hypercarbia but not significantly worse than baseline  -Chest x-ray read as CHF but this appears less likely given clinical picture, normal BNP -will admit patient as it seems likely that she will need several days of hospitalization to show sufficient improvement for discharge. -Nebulizers: scheduled Duoneb and prn albuterol -Solu-Medrol 60 mg IV BID -> Prednisone 40 mg PO daily; hold home daily prednisone for now -IV Azithromycin  -Continue Trelegy, Robitussin -Coordinated care with TOC team/PT/OT/Nutrition/RT consults  Chronic diastolic CHF -As noted above, appears to be compensated from this standpoint -Will resume home diuresis -Continue ASA  HTN -She does not appear to be taking medications for this issue at this time   Hypothyroidism -Continue Synthroid at current dose for now   HLD -Continue atorvastatin  Mood d/o -Continue citalopram     Advance Care Planning:   Code Status: Full Code   Consults: Pulmonology; RT; TOC team; nutrition; PT/OT  DVT Prophylaxis: Lovenox  Family Communication: None present; she declined to have me call family at the time of admission  Severity of Illness: The appropriate patient status for this patient is INPATIENT.  Inpatient status is judged to be reasonable and necessary in order to provide the required intensity of service to ensure the patient's safety. The patient's presenting symptoms, physical exam findings, and initial radiographic and laboratory data in the context of their chronic comorbidities is felt to place them at high risk for further clinical deterioration. Furthermore, it is not anticipated that the patient will be medically stable  for discharge from the hospital within 2 midnights of admission.   * I certify that at the point of admission it is my clinical judgment that the patient will require inpatient hospital care spanning beyond 2 midnights from the point of admission due to high intensity of service, high risk for further deterioration and high frequency of surveillance required.*  Author: Karmen Bongo, MD 07/18/2022 6:24 PM  For on call review [email or web address].

## 2022-07-18 NOTE — ED Notes (Signed)
Pt using my phone to call husband.

## 2022-07-18 NOTE — ED Notes (Signed)
Pt noted to desaturate to 77% on 2L via nasal cannula. ED staff repositioned patient and titrated oxygen to 94% on 4L nasal cannula and pt currently tolerating well. Dr. Roxanne Mins notified of same.

## 2022-07-18 NOTE — Progress Notes (Signed)
Review of flowsheet after IV consult. Noted new IV documented. Consult cleared.

## 2022-07-19 DIAGNOSIS — J441 Chronic obstructive pulmonary disease with (acute) exacerbation: Secondary | ICD-10-CM | POA: Diagnosis not present

## 2022-07-19 LAB — CBC
HCT: 38.5 % (ref 36.0–46.0)
Hemoglobin: 12.2 g/dL (ref 12.0–15.0)
MCH: 31.3 pg (ref 26.0–34.0)
MCHC: 31.7 g/dL (ref 30.0–36.0)
MCV: 98.7 fL (ref 80.0–100.0)
Platelets: 159 10*3/uL (ref 150–400)
RBC: 3.9 MIL/uL (ref 3.87–5.11)
RDW: 14.3 % (ref 11.5–15.5)
WBC: 8.3 10*3/uL (ref 4.0–10.5)
nRBC: 0 % (ref 0.0–0.2)

## 2022-07-19 LAB — BASIC METABOLIC PANEL
Anion gap: 9 (ref 5–15)
BUN: 17 mg/dL (ref 8–23)
CO2: 35 mmol/L — ABNORMAL HIGH (ref 22–32)
Calcium: 9.5 mg/dL (ref 8.9–10.3)
Chloride: 91 mmol/L — ABNORMAL LOW (ref 98–111)
Creatinine, Ser: 0.97 mg/dL (ref 0.44–1.00)
GFR, Estimated: >60 mL/min (ref >60)
Glucose, Bld: 145 mg/dL — ABNORMAL HIGH (ref 70–99)
Potassium: 5.9 mmol/L — ABNORMAL HIGH (ref 3.5–5.1)
Sodium: 135 mmol/L (ref 135–145)

## 2022-07-19 MED ORDER — SODIUM ZIRCONIUM CYCLOSILICATE 10 G PO PACK
10.0000 g | PACK | Freq: Once | ORAL | Status: AC
  Administered 2022-07-19: 10 g via ORAL
  Filled 2022-07-19: qty 1

## 2022-07-19 MED ORDER — INFLUENZA VAC A&B SA ADJ QUAD 0.5 ML IM PRSY
0.5000 mL | PREFILLED_SYRINGE | INTRAMUSCULAR | Status: DC
  Filled 2022-07-19: qty 0.5

## 2022-07-19 MED ORDER — ENSURE ENLIVE PO LIQD
237.0000 mL | Freq: Two times a day (BID) | ORAL | Status: DC
  Administered 2022-07-19 – 2022-07-20 (×3): 237 mL via ORAL

## 2022-07-19 MED ORDER — ADULT MULTIVITAMIN W/MINERALS CH
1.0000 | ORAL_TABLET | Freq: Every day | ORAL | Status: DC
  Administered 2022-07-19 – 2022-07-21 (×3): 1 via ORAL
  Filled 2022-07-19 (×3): qty 1

## 2022-07-19 NOTE — TOC Transition Note (Addendum)
Transition of Care [facility]) - CM/SW Discharge Note   Patient Details  Name: Jasmine [name] MRN: [id number] Date of Birth: [birth year]  Transition of Care [facility]) CM/SW Contact:  Carles Collet, RN Phone Number: 07/19/2022, 2:20 PM   Clinical Narrative:     Spoke w patient at bedside.  She states that she is from home with home oxygen. She has no preference for [facility] agency. Referral accepted by [facility], then declined. Referral accepted by [facility] She has rollator at home, no other DME needs identified.  Please place [facility] PT OT order  Final next level of care: [place] Barriers to Discharge: Continued Medical Work up   Patient Goals and CMS Choice Patient states their goals for this hospitalization and ongoing recovery are:: to go home CMS Medicare.gov Compare Post Acute Care list provided to:: Patient Choice offered to / list presented to : Patient  Discharge Placement                       Discharge Plan and Services                DME Arranged: N/A         HH Arranged: PT, OT [place] Agency: [place] ([facility]) Date [place]: 07/19/22 Time [place]: 0347 Representative spoke with at [place]: [place] ([place]) Interventions     Readmission Risk Interventions    03/02/2020    3:30 PM 12/07/2019   10:24 AM  Readmission Risk Prevention Plan  Transportation Screening Complete Complete  PCP or Specialist Appt within 3-5 Days  Complete  HRI or [place] Complete Complete  Social Work Consult for [place] Planning/Counseling  Complete  Palliative Care Screening Not Applicable Not Applicable  Medication Review Press photographer) Referral to Pharmacy Complete

## 2022-07-19 NOTE — Evaluation (Signed)
Occupational Therapy Evaluation Patient Details Name: Jasmine [name] MRN: [id number] DOB: 01-01-1946 Today's Date: 07/19/2022   History of Present Illness Jasmine [name] is a 76 year old woman admitted 9/29 who is admitted to the hospital with acute on chronic respiratory failure due to COPD exacerbation. PMH:   former smoker with COPD (FEV1 30%), chronic hypoxemic and hypercapnic respiratory failure on home O2 and nocturnal NIV, asthma, lung cancer, HTN, CHF, depression   Clinical Impression   Patient admitted for the diagnosis above.  PTA she lives at home with her spouse, who does provide caregiver assist.  Primary deficit is poor activity tolerance and desaturates with activity.  Education regarding energy conservation done, and reinforcement will be needed.  She is at a generalized supervision level for in room mobility and ADL performance from a sit to stand level.  HH OT can be considered.  OT will follow in the acute setting.       Recommendations for follow up therapy are one component of a multi-disciplinary discharge planning process, led by the attending physician.  Recommendations may be updated based on patient status, additional functional criteria and insurance authorization.   Follow Up Recommendations  HH OT can be considered   Assistance Recommended at Discharge Intermittent Supervision/Assistance  Patient can return home with the following Assist for transportation;Assistance with cooking/housework    Functional Status Assessment  Patient has had a recent decline in their functional status and demonstrates the ability to make significant improvements in function in a reasonable and predictable amount of time.  Equipment Recommendations  None recommended by OT    Recommendations for Other Services       Precautions / Restrictions Precautions Precautions: Fall Restrictions Weight Bearing Restrictions: No      Mobility Bed Mobility               General  bed mobility comments: up in recliner    Transfers Overall transfer level: Needs assistance Equipment used: Rolling walker (2 wheels) Transfers: Sit to/from Stand Sit to Stand: Supervision                  Balance Overall balance assessment: Needs assistance Sitting-balance support: No upper extremity supported, Feet supported Sitting balance-Leahy Scale: Good     Standing balance support: Reliant on assistive device for balance Standing balance-Leahy Scale: Fair                             ADL either performed or assessed with clinical judgement   ADL       Grooming: Wash/dry hands;Supervision/safety;Standing               Lower Body Dressing: Supervision/safety;Sit to/from stand   Toilet Transfer: Supervision/safety;Ambulation;Regular Toilet                   Vision Patient Visual Report: No change from baseline       Perception Perception Perception: Within Functional Limits   Praxis Praxis Praxis: Intact    Pertinent Vitals/Pain Pain Assessment Pain Assessment: No/denies pain     Hand Dominance Right   Extremity/Trunk Assessment Upper Extremity Assessment Upper Extremity Assessment: Overall WFL for tasks assessed   Lower Extremity Assessment Lower Extremity Assessment: Defer to PT evaluation   Cervical / Trunk Assessment Cervical / Trunk Assessment: Kyphotic   Communication Communication Communication: No difficulties   Cognition Arousal/Alertness: Awake/alert Behavior During Therapy: WFL for tasks assessed/performed Overall Cognitive Status: Within Functional  Limits for tasks assessed                                       General Comments   Watch O2 sat    Exercises     Shoulder Instructions      Home Living Family/patient expects to be discharged to:: Private residence Living Arrangements: Spouse/significant other Available Help at Discharge: Family;Available 24 hours/day Type of Home:  House Home Access: Ramped entrance     Home Layout: One level     Bathroom Shower/Tub: Occupational psychologist: Handicapped height Bathroom Accessibility: Yes How Accessible: Accessible via walker Home Equipment: Rollator (4 wheels);Shower seat;Grab bars - tub/shower;BSC/3in1;Wheelchair - Publishing copy (2 wheels)          Prior Functioning/Environment Prior Level of Function : Independent/Modified Independent             Mobility Comments: using rollator in the home and in community ADLs Comments: pt taking seated rest breaks to complete self care tasks and light housework, spouse assisting with housework        OT Problem List: Decreased activity tolerance      OT Treatment/Interventions: Self-care/ADL training;Therapeutic activities;Balance training    OT Goals(Current goals can be found in the care plan section) Acute Rehab OT Goals Patient Stated Goal: Return home OT Goal Formulation: With patient Time For Goal Achievement: 08/01/22 Potential to Achieve Goals: Good ADL Goals Pt Will Perform Grooming: with modified independence;standing Pt Will Perform Lower Body Dressing: with modified independence;sit to/from stand Pt Will Transfer to Toilet: with modified independence;ambulating;regular height toilet  OT Frequency: Min 2X/week    Co-evaluation              AM-PAC OT "6 Clicks" Daily Activity     Outcome Measure Help from another person eating meals?: None Help from another person taking care of personal grooming?: A Little Help from another person toileting, which includes using toliet, bedpan, or urinal?: A Little Help from another person bathing (including washing, rinsing, drying)?: A Little Help from another person to put on and taking off regular upper body clothing?: None Help from another person to put on and taking off regular lower body clothing?: A Little 6 Click Score: 20   End of Session Equipment Utilized During  Treatment: Rolling walker (2 wheels);Oxygen Nurse Communication: Mobility status  Activity Tolerance: Patient tolerated treatment well Patient left: in bed;with call bell/phone within reach;with family/visitor present  OT Visit Diagnosis: Unsteadiness on feet (R26.81)                Time: 3710-6269 OT Time Calculation (min): 18 min Charges:  OT General Charges $OT Visit: 1 Visit OT Evaluation $OT Eval Moderate Complexity: 1 Mod  07/19/2022  RP, OTR/L  Acute Rehabilitation Services  Office:  [phone]   Metta Clines 07/19/2022, 2:23 PM

## 2022-07-19 NOTE — Evaluation (Signed)
Physical Therapy Evaluation Patient Details Name: Jasmine [name] MRN: [id number] DOB: 05/19/1946 Today's Date: 07/19/2022  History of Present Illness  Jasmine [name] is a 76 year old woman admitted 9/29 who is admitted to the hospital with acute on chronic respiratory failure due to COPD exacerbation. PMH:   former smoker with COPD (FEV1 30%), chronic hypoxemic and hypercapnic respiratory failure on home O2 and nocturnal NIV, asthma, lung cancer, HTN, CHF, depression  Clinical Impression  Pt admitted with above diagnosis. Pt was able to ambulate in room with 5LO2 in place with DOE 3/4 and desaturates to 75% with little activity and takes 2-3 min to recover. Pt is safe with RW. Pt states she is close to baseline and that she lives with her ex husband who Is her caregiver.  Pt woiuld benefit from PT and OT in the home for home assessment for safety, energy conservation techniques and an aide to decr burden of care on caregiver.  Will follo w acutely.  Pt currently with functional limitations due to the deficits listed below (see PT Problem List). Pt will benefit from skilled PT to increase their independence and safety with mobility to allow discharge to the venue listed below.          Recommendations for follow up therapy are one component of a multi-disciplinary discharge planning process, led by the attending physician.  Recommendations may be updated based on patient status, additional functional criteria and insurance authorization.  Follow Up Recommendations Home health PT ([facility], [place] aide)      Assistance Recommended at Discharge Intermittent Supervision/Assistance  Patient can return home with the following  A little help with walking and/or transfers;A little help with bathing/dressing/bathroom;Assistance with cooking/housework;Assist for transportation;Help with stairs or ramp for entrance    Equipment Recommendations None recommended by PT  Recommendations for Other Services        Functional Status Assessment Patient has had a recent decline in their functional status and demonstrates the ability to make significant improvements in function in a reasonable and predictable amount of time.     Precautions / Restrictions Precautions Precautions: Fall Restrictions Weight Bearing Restrictions: No      Mobility  Bed Mobility Overal bed mobility: Independent                  Transfers Overall transfer level: Needs assistance Equipment used: Rolling walker (2 wheels) Transfers: Sit to/from Stand Sit to Stand: Supervision                Ambulation/Gait Ambulation/Gait assistance: Min guard Gait Distance (Feet): 40 Feet (20 feet x 2) Assistive device: Rolling walker (2 wheels) Gait Pattern/deviations: Step-through pattern, Decreased stride length, Trunk flexed, Drifts right/left   Gait velocity interpretation: <1.31 ft/sec, indicative of household ambulator   General Gait Details: Pt requested to walk to bathroom therefore pt did walk with 5LO2 to bathroom with min guard assist and RW.  Balance good.  Pt desat to 75% with activity and takes 2-3 min to recover to >90%.  Pt was able to clean herself after having BM.  Gets very SOB.  Stairs            Wheelchair Mobility    Modified Rankin (Stroke Patients Only)       Balance Overall balance assessment: Needs assistance Sitting-balance support: No upper extremity supported, Feet supported Sitting balance-Leahy Scale: Fair     Standing balance support: Bilateral upper extremity supported, During functional activity Standing balance-Leahy Scale: Poor Standing balance comment:  relies on UE support for balance                             Pertinent Vitals/Pain Pain Assessment Pain Assessment: No/denies pain    Home Living Family/patient expects to be discharged to:: Private residence Living Arrangements: Spouse/significant other (Lives with ex husband who cares for  pt) Available Help at Discharge: Family;Available 24 hours/day Type of Home: House Home Access: Ramped entrance       Home Layout: One level Home Equipment: Rollator (4 wheels);Shower seat;Grab bars - tub/shower;BSC/3in1;Wheelchair - Publishing copy (2 wheels) Additional Comments: 3-4 LO2 baseline    Prior Function Prior Level of Function : Independent/Modified Independent             Mobility Comments: using rollator in the home and in community ADLs Comments: pt taking seated rest breaks to complete self care tasks and light housework, spouse assisting with housework     Hand Dominance   Dominant Hand: Right    Extremity/Trunk Assessment   Upper Extremity Assessment Upper Extremity Assessment: Defer to OT evaluation    Lower Extremity Assessment Lower Extremity Assessment: Generalized weakness    Cervical / Trunk Assessment Cervical / Trunk Assessment: Kyphotic  Communication   Communication: No difficulties  Cognition Arousal/Alertness: Awake/alert Behavior During Therapy: WFL for tasks assessed/performed Overall Cognitive Status: Within Functional Limits for tasks assessed                                          General Comments General comments (skin integrity, edema, etc.): 72-128  bpm, 93%5L at rest, desaturates with talking and eating to low 80's, Desaturates to 75% with activity with 5LO2.  118/61    Exercises     Assessment/Plan    PT Assessment Patient needs continued PT services  PT Problem List Decreased activity tolerance;Decreased balance;Decreased mobility;Decreased safety awareness;Decreased knowledge of use of DME;Cardiopulmonary status limiting activity       PT Treatment Interventions DME instruction;Gait training;Functional mobility training;Therapeutic activities;Therapeutic exercise;Balance training;Patient/family education;Stair training    PT Goals (Current goals can be found in the Care Plan section)   Acute Rehab PT Goals Patient Stated Goal: to go home PT Goal Formulation: With patient Time For Goal Achievement: 08/02/22 Potential to Achieve Goals: Good    Frequency Min 3X/week     Co-evaluation               AM-PAC PT "6 Clicks" Mobility  Outcome Measure Help needed turning from your back to your side while in a flat bed without using bedrails?: None Help needed moving from lying on your back to sitting on the side of a flat bed without using bedrails?: None Help needed moving to and from a bed to a chair (including a wheelchair)?: A Little Help needed standing up from a chair using your arms (e.g., wheelchair or bedside chair)?: A Little Help needed to walk in hospital room?: A Little Help needed climbing 3-5 steps with a railing? : A Lot 6 Click Score: 19    End of Session Equipment Utilized During Treatment: Gait belt;Oxygen Activity Tolerance: Patient limited by fatigue Patient left: in chair;with call bell/phone within reach;with chair alarm set Nurse Communication: Mobility status PT Visit Diagnosis: Muscle weakness (generalized) (M62.81)    Time: 5643-3295 PT Time Calculation (min) (ACUTE ONLY): 41 min   Charges:  PT Evaluation $PT Eval Moderate Complexity: 1 Mod PT Treatments $Gait Training: 8-22 mins $Self Care/Home Management: 8-22        [facility] M,PT Acute Rehab Services [place] 07/19/2022, 9:15 AM

## 2022-07-19 NOTE — Progress Notes (Signed)
Initial Nutrition Assessment  DOCUMENTATION CODES:   Not applicable  INTERVENTION:   -Ensure Enlive po BID, each supplement provides 350 kcal and 20 grams of protein -MVI with minerals daily  NUTRITION DIAGNOSIS:   Increased nutrient needs related to chronic illness (COPD) as evidenced by estimated needs.  GOAL:   Patient will meet greater than or equal to 90% of their needs  MONITOR:   PO intake, Supplement acceptance  REASON FOR ASSESSMENT:   Consult Assessment of nutrition requirement/status  ASSESSMENT:   Pt with medical history significant of lung cancer; chronic diastolic CHF; COPD on 2L home O2; HTN; and hypothyroidism presenting with SOB.  Pt admitted with COPD exacerbation.  Reviewed I/O's: -400 ml x 24 hours  UOP: 400 ml since admission   Pt unavailable at time of visit. Attempted to speak with pt via call to hospital room phone, however, unable to reach. RD unable to obtain further nutrition-related history or complete nutrition-focused physical exam at this time.    Pt currently on a regular diet. No meal completion data available to assess at this time.  Reviewed wt hx; wt has been stable over the past 7 months. Pt with moderate to deep pitting edema, which may be masking true weight loss as well as fat and muscle depletions.   Medications reviewed and include colace, lasix, potassium chloride, and prednisone.   Lab Results  Component Value Date   HGBA1C 6.5 (A) 02/25/2022   PTA DM medications are none.   Labs reviewed: K: 5.9, CBGS: 109 (inpatient orders for glycemic control are none).    Diet Order:   Diet Order             Diet regular Room service appropriate? Yes; Fluid consistency: Thin  Diet effective now                   EDUCATION NEEDS:   No education needs have been identified at this time  Skin:  Skin Assessment: Reviewed RN Assessment  Last BM:  07/17/22  Height:   Ht Readings from Last 1 Encounters:  07/18/22 5'  1" (1.549 m)    Weight:   Wt Readings from Last 1 Encounters:  07/19/22 53.7 kg    Ideal Body Weight:  47.7 kg  BMI:  Body mass index is 22.37 kg/m.  Estimated Nutritional Needs:   Kcal:  1500-1700  Protein:  80-95 grams  Fluid:  > 1.5 L    Loistine Chance, RD, LDN, [place] Registered Dietitian II Certified Diabetes Care and Education Specialist Please refer to [facility] for RD and/or RD on-call/weekend/after hours pager

## 2022-07-19 NOTE — Progress Notes (Signed)
PROGRESS NOTE    Jasmine [name]  AOZ:[id number] DOB: 01-Oct-1946 DOA: 07/18/2022 PCP: Biagio Borg, MD   Brief Narrative:  Jasmine [name] is a 76 y.o. female with medical history significant of lung cancer; chronic diastolic CHF; COPD on 2L home O2; HTN; and hypothyroidism presenting with SOB  Assessment & Plan:  Acute on chronic hypoxic and hypercapnic respiratory failure in the setting of COPD exacerbation: -She is on 2 L of oxygen via nasal cannula requiring 4-5 L now.  Room and afebrile with no leukocytosis.  ABG shows chronic hypercarbia.  Chest x-ray shows CHF.  Normal BNP.  COVID-negative -Started on Solu-Medrol transitioned to prednisone -Continue azithromycin for 5 days -PCCM consulted-appreciate help recommended budesonide/Yupelri/Brovana nebulizer treatment while in the hospital and can transition back to Trelegy upon discharge.  Start montelukast and loratadine for allergies.  Continue NIV at night.  Follow-up with PCCM outpatient in 7 to 10 days.  PCCM signed off. -Continue to monitor vitals closely.  We will try to wean off of oxygen as tolerated.  Chronic diastolic CHF: -Reviewed chest x-ray.  BNP: WNL.  Received Lasix in ED. -We will continue home medications-Lasix 20 mg once daily -Strict INO's and daily weight.  Hyperkalemia: Potassium 5.9.  Lokelma once given.  Potassium supplement discontinued.  Repeat BMP tomorrow a.m.  Hypertension: Stable.  Not on any medications.  Hypothyroidism: Continue levothyroxine  Hyperlipidemia: Continue statins  Mood disorder: Continue citalopram  DVT prophylaxis: Lovenox Code Status: Full code Family Communication:  None present at bedside.  Plan of care discussed with patient in length and she verbalized understanding and agreed with it. Disposition Plan: Likely home  Consultants:  PCCM  Procedures:  None  Antimicrobials:  Azithromycin  Status is: Inpatient   Subjective: Patient seen and examined.  Sitting on  recliner.  Reports improvement in breathing overall.  Reports more shortness of breath when she speaks.  No fever, chills.  No acute events overnight.  Denies any chest pain.  She does have a bilateral leg edema (L >R).    She uses 2 L oxygen via nasal cannulae at home.  Denies current  tobacco abuse  Objective: Vitals:   07/18/22 2200 07/18/22 2345 07/19/22 0431 07/19/22 0720  BP:  123/68 132/72 118/61  Pulse: 83 84 72 68  Resp: (!) '21 19 19 14  '$ Temp:  98.2 F (36.8 C) 97.9 F (36.6 C) 97.9 F (36.6 C)  TempSrc:  Oral Axillary Oral  SpO2: 91% 90% 91% 100%  Weight:   53.7 kg   Height:        Intake/Output Summary (Last 24 hours) at 07/19/2022 1041 Last data filed at 07/19/2022 0141 Gross per 24 hour  Intake --  Output 400 ml  Net -400 ml   Filed Weights   07/18/22 0325 07/18/22 1712 07/19/22 0431  Weight: 54.4 kg 55.1 kg 53.7 kg    Examination:  General exam: Ill-appearing 76 year old after American female on nasal cannula, sitting  on recliner and eating breakfast.  Short of breath while speaking Respiratory system: Bilateral expiratory wheezing noted on the bases.   Cardiovascular system: S1 & S2 heard, RRR. No JVD, murmurs, rubs, gallops or clicks.  Bilateral is pitting edema positive (L>R) Gastrointestinal system: Abdomen is nondistended, soft and nontender. No organomegaly or masses felt. Normal bowel sounds heard. Central nervous system: Alert and oriented. No focal neurological deficits. Extremities: Symmetric 5 x 5 power. Skin: No rashes, lesions or ulcers Psychiatry: Judgement and insight appear normal. Mood &  affect appropriate.    Data Reviewed: I have personally reviewed following labs and imaging studies  CBC: Recent Labs  Lab 07/18/22 0325 07/18/22 0400 07/19/22 0117  WBC  --  6.9 8.3  NEUTROABS  --  5.5  --   HGB 12.9 13.3 12.2  HCT 38.0 43.8 38.5  MCV  --  102.8* 98.7  PLT  --  152 885   Basic Metabolic Panel: Recent Labs  Lab 07/18/22 0325  07/18/22 0400 07/19/22 0117  NA 137 140 135  K 3.8 3.9 5.9*  CL  --  96* 91*  CO2  --  33* 35*  GLUCOSE  --  93 145*  BUN  --  8 17  CREATININE  --  0.73 0.97  CALCIUM  --  9.4 9.5   GFR: Estimated Creatinine Clearance: 36.7 mL/min (by C-G formula based on SCr of 0.97 mg/dL). Liver Function Tests: No results for input(s): "AST", "ALT", "ALKPHOS", "BILITOT", "PROT", "ALBUMIN" in the last 168 hours. No results for input(s): "LIPASE", "AMYLASE" in the last 168 hours. No results for input(s): "AMMONIA" in the last 168 hours. Coagulation Profile: No results for input(s): "INR", "PROTIME" in the last 168 hours. Cardiac Enzymes: No results for input(s): "CKTOTAL", "CKMB", "CKMBINDEX", "TROPONINI" in the last 168 hours. BNP (last 3 results) No results for input(s): "PROBNP" in the last 8760 hours. HbA1C: No results for input(s): "HGBA1C" in the last 72 hours. CBG: No results for input(s): "GLUCAP" in the last 168 hours. Lipid Profile: No results for input(s): "CHOL", "HDL", "LDLCALC", "TRIG", "CHOLHDL", "LDLDIRECT" in the last 72 hours. Thyroid Function Tests: No results for input(s): "TSH", "T4TOTAL", "FREET4", "T3FREE", "THYROIDAB" in the last 72 hours. Anemia Panel: No results for input(s): "VITAMINB12", "FOLATE", "FERRITIN", "TIBC", "IRON", "RETICCTPCT" in the last 72 hours. Sepsis Labs: No results for input(s): "PROCALCITON", "LATICACIDVEN" in the last 168 hours.  Recent Results (from the past 240 hour(s))  SARS Coronavirus 2 by RT PCR (hospital order, performed in [facility] hospital lab) *cepheid single result test* Anterior Nasal Swab     Status: None   Collection Time: 07/18/22  6:28 AM   Specimen: Anterior Nasal Swab  Result Value Ref Range Status   SARS Coronavirus 2 by RT PCR NEGATIVE NEGATIVE Final    Comment: (NOTE) SARS-CoV-2 target nucleic acids are NOT DETECTED.  The SARS-CoV-2 RNA is generally detectable in upper and lower respiratory specimens during the  acute phase of infection. The lowest concentration of SARS-CoV-2 viral copies this assay can detect is 250 copies / mL. A negative result does not preclude SARS-CoV-2 infection and should not be used as the sole basis for treatment or other patient management decisions.  A negative result may occur with improper specimen collection / handling, submission of specimen other than nasopharyngeal swab, presence of viral mutation(s) within the areas targeted by this assay, and inadequate number of viral copies (<250 copies / mL). A negative result must be combined with clinical observations, patient history, and epidemiological information.  Fact Sheet for Patients:   https://www.patel.info/  Fact Sheet for Healthcare Providers: https://hall.com/  This test is not yet approved or  cleared by the [place] FDA and has been authorized for detection and/or diagnosis of SARS-CoV-2 by FDA under an Emergency Use Authorization (EUA).  This EUA will remain in effect (meaning this test can be used) for the duration of the COVID-19 declaration under Section 564(b)(1) of the Act, 21 U.S.C. section 360bbb-3(b)(1), unless the authorization is terminated or revoked sooner.  Performed at [facility], [place] [address], [place], [place] [address]       Radiology Studies: DG Chest Port 1 View  Result Date: 07/18/2022 CLINICAL DATA:  Dyspnea EXAM: PORTABLE CHEST 1 VIEW COMPARISON:  04/27/2022 FINDINGS: Stable parenchymal scarring within the right mid lung zone. Interval development of a small left pleural effusion. There is progressive cephalization of the pulmonary vasculature and development of trace interstitial pulmonary infiltrate in keeping with developing mild cardiogenic failure or volume overload. No pneumothorax. Cardiac size within normal limits. No acute bone abnormality. IMPRESSION: Interval development of mild cardiogenic failure or volume  overload. Electronically Signed   By: Fidela Salisbury M.D.   On: 07/18/2022 03:24    Scheduled Meds:  arformoterol  15 mcg Nebulization BID   aspirin EC  81 mg Oral Daily   atorvastatin  10 mg Oral Daily   azithromycin  500 mg Oral Daily   budesonide (PULMICORT) nebulizer solution  0.5 mg Nebulization BID   citalopram  20 mg Oral Daily   docusate sodium  100 mg Oral BID   enoxaparin (LOVENOX) injection  40 mg Subcutaneous Q24H   fluticasone  1 spray Each Nare Daily   furosemide  20 mg Oral Daily   levothyroxine  75 mcg Oral QAC breakfast   loratadine  10 mg Oral Daily   montelukast  10 mg Oral QHS   predniSONE  40 mg Oral Q breakfast   revefenacin  175 mcg Nebulization Daily   sodium chloride flush  3 mL Intravenous Q12H   Continuous Infusions:   LOS: 1 day   Time spent: 35 minutes   Cristino Degroff Loann Quill, MD [facility]  If 7PM-7AM, please contact night-coverage [email or web address] 07/19/2022, 10:41 AM

## 2022-07-20 DIAGNOSIS — J441 Chronic obstructive pulmonary disease with (acute) exacerbation: Secondary | ICD-10-CM | POA: Diagnosis not present

## 2022-07-20 LAB — BASIC METABOLIC PANEL
Anion gap: 9 (ref 5–15)
BUN: 18 mg/dL (ref 8–23)
CO2: 39 mmol/L — ABNORMAL HIGH (ref 22–32)
Calcium: 9.6 mg/dL (ref 8.9–10.3)
Chloride: 91 mmol/L — ABNORMAL LOW (ref 98–111)
Creatinine, Ser: 0.71 mg/dL (ref 0.44–1.00)
GFR, Estimated: >60 mL/min (ref >60)
Glucose, Bld: 98 mg/dL (ref 70–99)
Potassium: 4.2 mmol/L (ref 3.5–5.1)
Sodium: 139 mmol/L (ref 135–145)

## 2022-07-20 LAB — MAGNESIUM: Magnesium: 1.8 mg/dL (ref 1.7–2.4)

## 2022-07-20 MED ORDER — METHYLPREDNISOLONE SODIUM SUCC 125 MG IJ SOLR
80.0000 mg | Freq: Every day | INTRAMUSCULAR | Status: DC
  Administered 2022-07-20: 80 mg via INTRAVENOUS
  Filled 2022-07-20: qty 2

## 2022-07-20 MED ORDER — TORSEMIDE 20 MG PO TABS
20.0000 mg | ORAL_TABLET | Freq: Every day | ORAL | Status: DC
  Administered 2022-07-20: 20 mg via ORAL
  Filled 2022-07-20: qty 1

## 2022-07-20 MED ORDER — TORSEMIDE 20 MG PO TABS: 20.0000 mg | ORAL_TABLET | Freq: Every day | ORAL | Status: DC

## 2022-07-20 NOTE — Progress Notes (Signed)
PROGRESS NOTE    Jasmine SIDA  [name]:[id number] DOB: 1946/05/22 DOA: 07/18/2022 PCP: Biagio Borg, MD   Brief Narrative:  Jasmine [name] is a 76 y.o. female with medical history significant of lung cancer; chronic diastolic CHF; COPD on 2L home O2; HTN; and hypothyroidism presenting with SOB  Assessment & Plan:  Acute on chronic hypoxic and hypercapnic respiratory failure in the setting of COPD exacerbation: -She is on 2 L of oxygen via nasal cannula requiring 4-5 L now.  Room and afebrile with no leukocytosis.  ABG shows chronic hypercarbia.  Chest x-ray shows CHF.  Normal BNP.  COVID-negative -Started on Solu-Medrol transitioned to prednisone -Continue azithromycin for 5 days -PCCM consulted-appreciate help recommended budesonide/Yupelri/Brovana nebulizer treatment while in the hospital and can transition back to Trelegy upon discharge.  Start montelukast and loratadine for allergies.  Continue NIV at night.  Follow-up with PCCM outpatient in 7 to 10 days.  PCCM signed off. -Continue to monitor vitals closely.  We will try to wean off of oxygen as tolerated. 07/20/2022: Patient seen.  Patient is now back to baseline.  Will discontinue prednisone.  Will restart IV Solu-Medrol 40 Mg twice daily.  Chronic diastolic CHF: -Reviewed chest x-ray.  BNP: WNL.  Received Lasix in ED. -We will continue home medications-Lasix 20 mg once daily -Strict INO's and daily weight. 07/20/2022: We will change Lasix 20 Mg to torsemide 20 Mg p.o. once daily.  Continue to monitor renal function and electrolytes.  Hyperkalemia: -Resolved. -Potassium is 4.2 today.   Hypertension: Stable.  Not on any medications.  Hypothyroidism: Continue levothyroxine  Hyperlipidemia: Continue statins  Mood disorder: Continue citalopram  DVT prophylaxis: Lovenox Code Status: Full code Family Communication:  None present at bedside.  Plan of care discussed with patient in length and she verbalized understanding and  agreed with it. Disposition Plan: Likely home  Consultants:  PCCM  Procedures:  None  Antimicrobials:  Azithromycin  Status is: Inpatient   Subjective: Patient seen and examined.   Patient is not back to baseline.    She uses 2 L oxygen via nasal cannulae at home.  Denies current  tobacco abuse  Objective: Vitals:   07/20/22 0034 07/20/22 0511 07/20/22 0729 07/20/22 1448  BP: 132/71 117/65 111/65 (!) 140/67  Pulse: 66 64 65 98  Resp:  '18 18 17  '$ Temp: 98 F (36.7 C) 98 F (36.7 C) 98 F (36.7 C) 98.1 F (36.7 C)  TempSrc: Oral Oral Oral Oral  SpO2: (!) 89% 99% 98% 94%  Weight: 55.9 kg     Height:        Intake/Output Summary (Last 24 hours) at 07/20/2022 1641 Last data filed at 07/20/2022 0857 Gross per 24 hour  Intake 360 ml  Output 325 ml  Net 35 ml    Filed Weights   07/18/22 1712 07/19/22 0431 07/20/22 0034  Weight: 55.1 kg 53.7 kg 55.9 kg    Examination:  General exam: Not in any distress.  Awake and alert.   Respiratory system: Very decreased air entry bilaterally (posteriorly).  Cardiovascular system: S1 & S2 heard.   Gastrointestinal system: Abdomen is nondistended, soft and nontender. No organomegaly or masses felt. Normal bowel sounds heard. Central nervous system: Alert and oriented. No focal neurological deficits. Extremities: Minimal edema of the lower extremities.  Data Reviewed: I have personally reviewed following labs and imaging studies  CBC: Recent Labs  Lab 07/18/22 0325 07/18/22 0400 07/19/22 0117  WBC  --  6.9 8.3  NEUTROABS  --  5.5  --   HGB 12.9 13.3 12.2  HCT 38.0 43.8 38.5  MCV  --  102.8* 98.7  PLT  --  152 734    Basic Metabolic Panel: Recent Labs  Lab 07/18/22 0325 07/18/22 0400 07/19/22 0117 07/20/22 0320  NA 137 140 135 139  K 3.8 3.9 5.9* 4.2  CL  --  96* 91* 91*  CO2  --  33* 35* 39*  GLUCOSE  --  93 145* 98  BUN  --  '8 17 18  '$ CREATININE  --  0.73 0.97 0.71  CALCIUM  --  9.4 9.5 9.6  MG  --   --    --  1.8    GFR: Estimated Creatinine Clearance: 44.4 mL/min (by C-G formula based on SCr of 0.71 mg/dL). Liver Function Tests: No results for input(s): "AST", "ALT", "ALKPHOS", "BILITOT", "PROT", "ALBUMIN" in the last 168 hours. No results for input(s): "LIPASE", "AMYLASE" in the last 168 hours. No results for input(s): "AMMONIA" in the last 168 hours. Coagulation Profile: No results for input(s): "INR", "PROTIME" in the last 168 hours. Cardiac Enzymes: No results for input(s): "CKTOTAL", "CKMB", "CKMBINDEX", "TROPONINI" in the last 168 hours. BNP (last 3 results) No results for input(s): "PROBNP" in the last 8760 hours. HbA1C: No results for input(s): "HGBA1C" in the last 72 hours. CBG: No results for input(s): "GLUCAP" in the last 168 hours. Lipid Profile: No results for input(s): "CHOL", "HDL", "LDLCALC", "TRIG", "CHOLHDL", "LDLDIRECT" in the last 72 hours. Thyroid Function Tests: No results for input(s): "TSH", "T4TOTAL", "FREET4", "T3FREE", "THYROIDAB" in the last 72 hours. Anemia Panel: No results for input(s): "VITAMINB12", "FOLATE", "FERRITIN", "TIBC", "IRON", "RETICCTPCT" in the last 72 hours. Sepsis Labs: No results for input(s): "PROCALCITON", "LATICACIDVEN" in the last 168 hours.  Recent Results (from the past 240 hour(s))  SARS Coronavirus 2 by RT PCR (hospital order, performed in [facility] hospital lab) *cepheid single result test* Anterior Nasal Swab     Status: None   Collection Time: 07/18/22  6:28 AM   Specimen: Anterior Nasal Swab  Result Value Ref Range Status   SARS Coronavirus 2 by RT PCR NEGATIVE NEGATIVE Final    Comment: (NOTE) SARS-CoV-2 target nucleic acids are NOT DETECTED.  The SARS-CoV-2 RNA is generally detectable in upper and lower respiratory specimens during the acute phase of infection. The lowest concentration of SARS-CoV-2 viral copies this assay can detect is 250 copies / mL. A negative result does not preclude SARS-CoV-2 infection and  should not be used as the sole basis for treatment or other patient management decisions.  A negative result may occur with improper specimen collection / handling, submission of specimen other than nasopharyngeal swab, presence of viral mutation(s) within the areas targeted by this assay, and inadequate number of viral copies (<250 copies / mL). A negative result must be combined with clinical observations, patient history, and epidemiological information.  Fact Sheet for Patients:   https://www.patel.info/  Fact Sheet for Healthcare Providers: https://hall.com/  This test is not yet approved or  cleared by the [place] FDA and has been authorized for detection and/or diagnosis of SARS-CoV-2 by FDA under an Emergency Use Authorization (EUA).  This EUA will remain in effect (meaning this test can be used) for the duration of the COVID-19 declaration under Section 564(b)(1) of the Act, 21 U.S.C. section 360bbb-3(b)(1), unless the authorization is terminated or revoked sooner.  Performed at [facility], [place] [address] [place], [place]  [place]       Radiology Studies: No results found.  Scheduled Meds:  arformoterol  15 mcg Nebulization BID   aspirin EC  81 mg Oral Daily   atorvastatin  10 mg Oral Daily   azithromycin  500 mg Oral Daily   budesonide (PULMICORT) nebulizer solution  0.5 mg Nebulization BID   citalopram  20 mg Oral Daily   docusate sodium  100 mg Oral BID   enoxaparin (LOVENOX) injection  40 mg Subcutaneous Q24H   feeding supplement  237 mL Oral BID BM   fluticasone  1 spray Each Nare Daily   influenza vaccine adjuvanted  0.5 mL Intramuscular Tomorrow-1000   levothyroxine  75 mcg Oral QAC breakfast   loratadine  10 mg Oral Daily   methylPREDNISolone (SOLU-MEDROL) injection  80 mg Intravenous q1600   montelukast  10 mg Oral QHS   multivitamin with minerals  1 tablet Oral Daily   revefenacin  175 mcg  Nebulization Daily   sodium chloride flush  3 mL Intravenous Q12H   torsemide  20 mg Oral Daily   Continuous Infusions:   LOS: 2 days   Time spent: 55 minutes.  Bonnell Public, MD [facility]  If 7PM-7AM, please contact night-coverage [email or web address] 07/20/2022, 4:41 PM

## 2022-07-21 ENCOUNTER — Inpatient Hospital Stay: Admission: RE | Admit: 2022-07-21 | Payer: TRICARE For Life (TFL) | Source: Ambulatory Visit

## 2022-07-21 DIAGNOSIS — J441 Chronic obstructive pulmonary disease with (acute) exacerbation: Secondary | ICD-10-CM | POA: Diagnosis not present

## 2022-07-21 DIAGNOSIS — E875 Hyperkalemia: Secondary | ICD-10-CM

## 2022-07-21 DIAGNOSIS — E785 Hyperlipidemia, unspecified: Secondary | ICD-10-CM

## 2022-07-21 LAB — RENAL FUNCTION PANEL
Albumin: 3.2 g/dL — ABNORMAL LOW (ref 3.5–5.0)
Anion gap: 11 (ref 5–15)
BUN: 28 mg/dL — ABNORMAL HIGH (ref 8–23)
CO2: 44 mmol/L — ABNORMAL HIGH (ref 22–32)
Calcium: 9.5 mg/dL (ref 8.9–10.3)
Chloride: 84 mmol/L — ABNORMAL LOW (ref 98–111)
Creatinine, Ser: 1.13 mg/dL — ABNORMAL HIGH (ref 0.44–1.00)
GFR, Estimated: 50 mL/min — ABNORMAL LOW (ref >60)
Glucose, Bld: 138 mg/dL — ABNORMAL HIGH (ref 70–99)
Phosphorus: 4.3 mg/dL (ref 2.5–4.6)
Potassium: 5.2 mmol/L — ABNORMAL HIGH (ref 3.5–5.1)
Sodium: 139 mmol/L (ref 135–145)

## 2022-07-21 MED ORDER — PREDNISONE 10 MG PO TABS: ORAL_TABLET | ORAL | 0 refills | Status: DC

## 2022-07-21 MED ORDER — MONTELUKAST SODIUM 10 MG PO TABS: 10.0000 mg | ORAL_TABLET | Freq: Every day | ORAL | 3 refills | Status: DC

## 2022-07-21 MED ORDER — AZITHROMYCIN 250 MG PO TABS: 250.0000 mg | ORAL_TABLET | Freq: Every day | ORAL | 0 refills | Status: DC

## 2022-07-21 NOTE — Hospital Course (Signed)
Jasmine [name] is a 76 y.o. F with COPD (FEV1 30%), chronic hypoxemic and hypercapnic respiratory failure on home O2 and nocturnal NIV, asthma, former smoker, hx lung cancer, HTN, CHF, depression who presented with COPD flare.

## 2022-07-21 NOTE — Discharge Summary (Signed)
Physician Discharge Summary   Patient: Jasmine [name] MRN: [id number] DOB: 07/31/1946  Admit date:     07/18/2022  Discharge date: 07/21/22  Discharge Physician: Edwin Dada   PCP: Biagio Borg, MD     Recommendations at discharge:  Follow up with [place] for ongoing goals of care discussion Follow up with Dr. Elsworth Soho within 2 weeks for evaluation of resolving COPD flare and also ongoing prognosis/goals of care Dr. Elsworth Soho: Please repeat CT chest if indicated, per goals of care     Discharge Diagnoses: Principal Problem:   COPD with acute exacerbation ([place]) Active Problems:   Depression with anxiety   Essential hypertension   Acute on chronic respiratory failure with hypoxia and hypercapnia   Chronic diastolic congestive heart failure (HCC)   Lung nodules   Steroid-dependent chronic obstructive pulmonary disease ([place])   Postablative hypothyroidism   Hyperkalemia   Hyperlipidemia     Hospital Course: Jasmine [name] is a 76 y.o. F with COPD (FEV1 30%), chronic hypoxemic and hypercapnic respiratory failure on home O2 and nocturnal NIV, asthma, former smoker, hx lung cancer, HTN, CHF, depression who presented with COPD flare.   Acute on chronic respiratory failure with hypoxia and hypercapnia due to COPD flare COPD with acute exacerbation  Patient admitted and started on steroids, bronchodilators and antibiotics.  She was treated 4 days in the hospital, and felt close to her baseline function, somewhat dyspneic with exertion, but able to walk short distances.  She did need 4L O2 at rest and 6L O2 with exertion at discharge.  This was discussed with Case Management, who arranged to have her home O2 needs verified with her home health agency.  I discharged with a 2 week prednisone taper, a few more days azithromycin.  Pulmonology follow up was arranged.  Patient is established with Palliative Care.   Lung nodules CT imaging showed incidental stable  bilateral pulmonary nodules, the largest measuring 1 cm. Radiology recommended the following wtihin 3 months: (a) repeat chest CT, (b) follow-up PET-CT, or (c) tissue sampling.            The [facility] Controlled Substances Registry was reviewed for this patient prior to discharge.      Disposition: Home with home health    DISCHARGE MEDICATION: Allergies as of 07/21/2022       Reactions   Alendronate Sodium Other (See Comments)   Patient does not remember this reaction        Medication List     TAKE these medications    acetaminophen 325 MG tablet Commonly known as: TYLENOL Take 2 tablets (650 mg total) by mouth every 6 (six) hours as needed for mild pain (or Fever >/= 101).   albuterol 108 (90 Base) MCG/ACT inhaler Commonly known as: VENTOLIN HFA INHALE 1 TO 2 PUFFS INTO THE LUNGS EVERY 6 HOURS AS NEEDED FOR WHEEZING OR SHORTNESS OF BREATH What changed:  how much to take how to take this when to take this reasons to take this additional instructions   aspirin EC 81 MG tablet Take 1 tablet (81 mg total) by mouth daily.   atorvastatin 10 MG tablet Commonly known as: LIPITOR TAKE 1 TABLET BY MOUTH DAILY. What changed:  how much to take how to take this when to take this additional instructions   azithromycin 250 MG tablet Commonly known as: Zithromax Take 1 tablet (250 mg total) by mouth daily.   citalopram 20 MG tablet Commonly known as: CELEXA TAKE  ONE TABLET BY MOUTH DAILY What changed:  how much to take how to take this when to take this   fluticasone 50 MCG/ACT nasal spray Commonly known as: FLONASE Place 1 spray into both nostrils daily.   furosemide 20 MG tablet Commonly known as: Lasix Take 1 tablet (20 mg total) by mouth daily.   guaiFENesin 100 MG/5ML liquid Commonly known as: ROBITUSSIN Take 5 mLs by mouth every 4 (four) hours as needed for cough or to loosen phlegm. Mucinex chest congestion (liquid)    ipratropium-albuterol 0.5-2.5 (3) MG/3ML Soln Commonly known as: DUONEB Take 3 mLs by nebulization every 6 (six) hours as needed. What changed: reasons to take this   levothyroxine 75 MCG tablet Commonly known as: SYNTHROID Take 1 tablet (75 mcg total) by mouth daily before breakfast. TAKE ONE TABLET BY MOUTH EARLY MORNING What changed: additional instructions   loratadine 10 MG tablet Commonly known as: CLARITIN Take 1 tablet (10 mg total) by mouth daily. What changed:  when to take this reasons to take this   montelukast 10 MG tablet Commonly known as: SINGULAIR Take 1 tablet (10 mg total) by mouth at bedtime.   OXYGEN Inhale 3 L into the lungs continuous. 24/7   potassium chloride 10 MEQ tablet Commonly known as: KLOR-CON M Take 1 tablet (10 mEq total) by mouth daily.   predniSONE 10 MG tablet Commonly known as: DELTASONE Take prednisone 40 mg (4 tabs) once daily for 3 days then take prednisone 30 mg (3 tabs) once daily for 3 days then take prednisone 20 mg (2 tabs) once daily for 3 days then take prednisone 10 mg (1 tab) once daily for three days. What changed:  medication strength how much to take how to take this when to take this additional instructions   Trelegy Ellipta 200-62.5-25 MCG/ACT Aepb Generic drug: Fluticasone-Umeclidin-Vilant INHALE 1 PUFF INTO THE LUNGS DAILY.        Follow-up [place]. Follow up.   Why: Agency will call you to set up apt times  ([facility]) Contact information: [place] [address] [phone]         Rigoberto Noel, MD. Schedule an appointment as soon as possible for a visit in 1 week(s).   Specialty: Pulmonary Disease Contact information: [place] [address] [phone]                 Discharge Instructions     Discharge instructions   Complete by: As directed    From Dr> Enyah Moman: Take prednisone tapering down  from 40 mg over the next 12 days First take prednisone 40 mg for 3 days then  Take prednisone 30 mg for 3 days then  20 mg for 3 days then  10 mg for 3 days   Follow up with Dr. Elsworth Soho in 1 week Follow up with your primary care doctor  Take the antibiotic azithromycin for 4 more days  Take your nebulizer (Duo neb) three times daily for the next week then reduce to "as needed" use  Start the medicine Singulair, which may help your breathing   Increase activity slowly   Complete by: As directed        Discharge Exam: Filed Weights   07/19/22 0431 07/20/22 0034 07/21/22 0442  Weight: 53.7 kg 55.9 kg 52.8 kg    General: Pt is alert, awake, not in acute distress Cardiovascular: RRR, nl  S1-S2, no murmurs appreciated.   No LE edema.   Respiratory: Normal respiratory rate and rhythm. Lung sounds diminished. Abdominal: Abdomen soft and non-tender.  No distension or HSM.   Neuro/Psych: Strength symmetric in upper and lower extremities.  Judgment and insight appear normal.   Condition at discharge: stable  The results of significant diagnostics from this hospitalization (including imaging, microbiology, ancillary and laboratory) are listed below for reference.   Imaging Studies: DG Chest Port 1 View  Result Date: 07/18/2022 CLINICAL DATA:  Dyspnea EXAM: PORTABLE CHEST 1 VIEW COMPARISON:  04/27/2022 FINDINGS: Stable parenchymal scarring within the right mid lung zone. Interval development of a small left pleural effusion. There is progressive cephalization of the pulmonary vasculature and development of trace interstitial pulmonary infiltrate in keeping with developing mild cardiogenic failure or volume overload. No pneumothorax. Cardiac size within normal limits. No acute bone abnormality. IMPRESSION: Interval development of mild cardiogenic failure or volume overload. Electronically Signed   By: Fidela Salisbury M.D.   On: 07/18/2022 03:24    Microbiology: Results for orders placed or  performed during the hospital encounter of 07/18/22  SARS Coronavirus 2 by RT PCR (hospital order, performed in [facility] hospital lab) *cepheid single result test* Anterior Nasal Swab     Status: None   Collection Time: 07/18/22  6:28 AM   Specimen: Anterior Nasal Swab  Result Value Ref Range Status   SARS Coronavirus 2 by RT PCR NEGATIVE NEGATIVE Final    Comment: (NOTE) SARS-CoV-2 target nucleic acids are NOT DETECTED.  The SARS-CoV-2 RNA is generally detectable in upper and lower respiratory specimens during the acute phase of infection. The lowest concentration of SARS-CoV-2 viral copies this assay can detect is 250 copies / mL. A negative result does not preclude SARS-CoV-2 infection and should not be used as the sole basis for treatment or other patient management decisions.  A negative result may occur with improper specimen collection / handling, submission of specimen other than nasopharyngeal swab, presence of viral mutation(s) within the areas targeted by this assay, and inadequate number of viral copies (<250 copies / mL). A negative result must be combined with clinical observations, patient history, and epidemiological information.  Fact Sheet for Patients:   https://www.patel.info/  Fact Sheet for Healthcare Providers: https://hall.com/  This test is not yet approved or  cleared by the [place] FDA and has been authorized for detection and/or diagnosis of SARS-CoV-2 by FDA under an Emergency Use Authorization (EUA).  This EUA will remain in effect (meaning this test can be used) for the duration of the COVID-19 declaration under Section 564(b)(1) of the Act, 21 U.S.C. section 360bbb-3(b)(1), unless the authorization is terminated or revoked sooner.  Performed at [facility], [place] [address] [place], [place] [address]    *Note: Due to a large number of results and/or encounters for the requested time period,  some results have not been displayed. A complete set of results can be found in Results Review.    Labs: CBC: Recent Labs  Lab 07/18/22 0325 07/18/22 0400 07/19/22 0117  WBC  --  6.9 8.3  NEUTROABS  --  5.5  --   HGB 12.9 13.3 12.2  HCT 38.0 43.8 38.5  MCV  --  102.8* 98.7  PLT  --  152 450   Basic Metabolic Panel: Recent Labs  Lab 07/18/22 0325 07/18/22 0400 07/19/22 0117 07/20/22 0320 07/21/22 0039  NA 137 140 135 139 139  K 3.8 3.9 5.9* 4.2 5.2*  CL  --  96* 91* 91* 84*  CO2  --  33* 35* 39* 44*  GLUCOSE  --  93 145* 98 138*  BUN  --  '8 17 18 '$ 28*  CREATININE  --  0.73 0.97 0.71 1.13*  CALCIUM  --  9.4 9.5 9.6 9.5  MG  --   --   --  1.8  --   PHOS  --   --   --   --  4.3   Liver Function Tests: Recent Labs  Lab 07/21/22 0039  ALBUMIN 3.2*   CBG: No results for input(s): "GLUCAP" in the last 168 hours.  Discharge time spent: approximately 35 minutes spent on discharge counseling, evaluation of patient on day of discharge, and coordination of discharge planning with nursing, social work, pharmacy and case management  Signed: Edwin Dada, MD [facility] 07/21/2022

## 2022-07-21 NOTE — Assessment & Plan Note (Signed)
Stable bilateral pulmonary nodules, the largest measuring 1 cm. Consider one of the following in 3 months for both low-risk and high-risk individuals: (a) repeat chest CT, (b) follow-up PET-CT, or (c) tissue sampling.

## 2022-07-21 NOTE — Progress Notes (Addendum)
SATURATION QUALIFICATIONS: (This note is used to comply with regulatory documentation for home oxygen)  Patient Saturations on Room Air at Rest = 86%  Patient Saturations on Room Air while Ambulating = NT as pt desaturates on RA at rest  Patient Saturations on 6 Liters of oxygen while Ambulating = 88%  Please briefly explain why patient needs home oxygen:Pt needing 5LO2 at rest and 6LO2 with ambulation to maintain sats >88%.  Thanks Konnor Jorden M,PT Acute Rehab Services [phone]

## 2022-07-21 NOTE — Consult Note (Signed)
   [facility] Inpatient Consult   07/21/2022  VALERYA MAXTON 24-Jul-1946 [id number]  [place] Organization [ACO] Patient: Sherre Poot [facility] Medicare  Primary Care Provider:  Biagio Borg, MD with [facility] which is listed to provide the transition of care  Patient was assessed for [place] [facility] for community services. Patient was previously active with [place] [facility] at [facility].  Met with patient at bedside regarding being restarted with [facility] services. Her signiicant other is at the bedside.  She is transitioning home today with home health and she has [facility] noted for outpatient palliative follow up.  Explained this writer's visit.  Patient states she understands and encouraged her to contact if she feels she would like any care coordination.  She will have home health she says and her palliative nurse.  Of note, [facility] Care Management services does not replace or interfere with any services that are arranged by inpatient [facility] care management team.  No additional needs noted at this time. I do anticipate the PCP office to provide the [facility] call/followup and explained if she needs more she can speak with them.  For additional questions or referrals please contact:  Natividad Brood, RN BSN [place]  [phone] business mobile phone Toll free office [phone]  *[place]  [phone] Fax number: [phone] [email or web address]

## 2022-07-21 NOTE — TOC Transition Note (Signed)
Transition of Care [facility]) - CM/SW Discharge Note   Patient Details  Name: SIRIA CALANDRO MRN: [id number] Date of Birth: Nov 14, 1945  Transition of Care [facility] [place]) CM/SW Contact:  Zenon Mayo, RN Phone Number: 07/21/2022, 1:48 PM   Clinical Narrative:    NCM spoke with patient at the bedside, she is for dc today, informed her she is set up with [facility] from previous NCM. She states she wants to change the [facility] agency, so NCM gave her the Medicare. Gov list again, and she chose [facility].  NCM made referral to Amy with [facility] she states she can take referral for HHPT, [place].  Soc will begin 24 to 48 hrs post dc.  She states she spouse will transport her home at dc.  [employer] brought oxygen tank up to patient's room for her to go home with.    Final next level of care: [place] Barriers to Discharge: No Barriers Identified   Patient Goals and CMS Choice Patient states their goals for this hospitalization and ongoing recovery are:: return home CMS Medicare.gov Compare Post Acute Care list provided to:: Patient Choice offered to / list presented to : Patient  Discharge Placement                       Discharge Plan and Services                DME Arranged: N/A DME Agency: NA       HH Arranged: PT, OT HH Agency: [place] Date [place]: 07/21/22 Time [place]: 1146 Representative spoke with at [place]: [place] ([place]) Interventions     Readmission Risk Interventions    03/02/2020    3:30 PM 12/07/2019   10:24 AM  Readmission Risk Prevention Plan  Transportation Screening Complete Complete  PCP or Specialist Appt within 3-5 Days  Complete  HRI or [place] Complete Complete  Social Work Consult for [place] Planning/Counseling  Complete  Palliative Care Screening Not Applicable Not Applicable  Medication Review [employer] [place]) Referral to Pharmacy Complete

## 2022-07-21 NOTE — Progress Notes (Signed)
Physical Therapy Treatment Patient Details Name: Jasmine [name] MRN: [id number] DOB: 01-Oct-1946 Today's Date: 07/21/2022   History of Present Illness Jasmine [name] is a 76 year old woman admitted 9/29 who is admitted to the hospital with acute on chronic respiratory failure due to COPD exacerbation. PMH:   former smoker with COPD (FEV1 30%), chronic hypoxemic and hypercapnic respiratory failure on home O2 and nocturnal NIV, asthma, lung cancer, HTN, CHF, depression    PT Comments    Pt admitted with above diagnosis. Pt was able to ambulate with RW with min guard assist and cues for pursed lip breathing as pt requires 5-6LO2 to keep sats in mid 80's-90's.  Pt deconditioned overall and was only walking short distances at baseline. Will continue to progress pt as able.  Pt currently with functional limitations due to balance and endurance deficits. Pt will benefit from skilled PT to increase their independence and safety with mobility to allow discharge to the venue listed below.      Recommendations for follow up therapy are one component of a multi-disciplinary discharge planning process, led by the attending physician.  Recommendations may be updated based on patient status, additional functional criteria and insurance authorization.  Follow Up Recommendations  Home health PT ([place] aide)     Assistance Recommended at Discharge Intermittent Supervision/Assistance  Patient can return home with the following A little help with walking and/or transfers;A little help with bathing/dressing/bathroom;Assistance with cooking/housework;Assist for transportation;Help with stairs or ramp for entrance   Equipment Recommendations  None recommended by PT    Recommendations for Other Services       Precautions / Restrictions Precautions Precautions: Fall Restrictions Weight Bearing Restrictions: No     Mobility  Bed Mobility Overal bed mobility: Needs Assistance Bed Mobility: Supine to Sit      Supine to sit: Min guard          Transfers Overall transfer level: Needs assistance Equipment used: Rolling walker (2 wheels) Transfers: Sit to/from Stand Sit to Stand: Supervision, Min guard                Ambulation/Gait Ambulation/Gait assistance: Min guard Gait Distance (Feet): 40 Feet (20 feet x 2) Assistive device: Rolling walker (2 wheels) Gait Pattern/deviations: Step-through pattern, Decreased stride length, Trunk flexed, Drifts right/left   Gait velocity interpretation: <1.31 ft/sec, indicative of household ambulator   General Gait Details: Pt requested to walk to bathroom therefore pt did walk with 5LO2 to bathroom with min guard assist and RW.  Balance good.  Pt desat to mid 101's with activity and takes 2-3 min to recover to >90%.  Pt was able to clean herself after having BM.  Gets very SOB and needs cues for pursed lip breathing.   Stairs             Wheelchair Mobility    Modified Rankin (Stroke Patients Only)       Balance Overall balance assessment: Needs assistance Sitting-balance support: No upper extremity supported, Feet supported Sitting balance-Leahy Scale: Good     Standing balance support: Reliant on assistive device for balance Standing balance-Leahy Scale: Poor Standing balance comment: relies on UE support for balance                            Cognition Arousal/Alertness: Awake/alert Behavior During Therapy: WFL for tasks assessed/performed Overall Cognitive Status: Within Functional Limits for tasks assessed  Exercises General Exercises - Lower Extremity Ankle Circles/Pumps: AROM, Both, 10 reps, Supine Long Arc Quad: AROM, Both, 10 reps, Seated    General Comments General comments (skin integrity, edema, etc.): 108-135 bpm, 125/94      Pertinent Vitals/Pain Pain Assessment Pain Assessment: No/denies pain    Home Living Family/patient  expects to be discharged to:: Private residence Living Arrangements: Spouse/significant other                      Prior Function            PT Goals (current goals can now be found in the care plan section) Acute Rehab PT Goals Patient Stated Goal: to go home Progress towards PT goals: Progressing toward goals    Frequency    Min 3X/week      PT Plan Current plan remains appropriate    Co-evaluation              AM-PAC PT "6 Clicks" Mobility   Outcome Measure  Help needed turning from your back to your side while in a flat bed without using bedrails?: None Help needed moving from lying on your back to sitting on the side of a flat bed without using bedrails?: None Help needed moving to and from a bed to a chair (including a wheelchair)?: A Little Help needed standing up from a chair using your arms (e.g., wheelchair or bedside chair)?: A Little Help needed to walk in hospital room?: A Little Help needed climbing 3-5 steps with a railing? : A Lot 6 Click Score: 19    End of Session Equipment Utilized During Treatment: Gait belt;Oxygen Activity Tolerance: Patient limited by fatigue Patient left: in chair;with call bell/phone within reach;with chair alarm set Nurse Communication: Mobility status PT Visit Diagnosis: Muscle weakness (generalized) (M62.81)     Time: 3532-9924 PT Time Calculation (min) (ACUTE ONLY): 26 min  Charges:  $Gait Training: 23-37 mins                     Annaleigha Woo M,PT Acute Rehab Services [place] 07/21/2022, 1:35 PM

## 2022-07-21 NOTE — Progress Notes (Addendum)
Discharge:  Discharge summary reviewed with patient.    IV access discontinued.  CCMD notified.   Significant other at bedside to assist on discharging home.   Home O2 provided by [employer] with 1 tank and significant other brought home portable O2 for a back up.  Patient assisted to private vehicle without difficulty

## 2022-07-23 ENCOUNTER — Telehealth: Payer: Self-pay

## 2022-07-23 ENCOUNTER — Telehealth: Payer: Self-pay | Admitting: Internal Medicine

## 2022-07-23 MED ORDER — AZITHROMYCIN 250 MG PO TABS: 250.0000 mg | ORAL_TABLET | Freq: Every day | ORAL | 0 refills | Status: DC

## 2022-07-23 MED ORDER — CITALOPRAM HYDROBROMIDE 20 MG PO TABS: ORAL_TABLET | ORAL | 3 refills | Status: DC

## 2022-07-23 NOTE — Telephone Encounter (Signed)
error 

## 2022-07-23 NOTE — Telephone Encounter (Signed)
Transition Care Management Unsuccessful Follow-up Telephone Call  Date of discharge and from where:  [facility] on 07/21/2022  Attempts:  1st Attempt  Reason for unsuccessful TCM follow-up call:  Voice mail full

## 2022-07-23 NOTE — Telephone Encounter (Signed)
Transition Care Management Follow-up Telephone Call Date of discharge and from where: [facility] on 07/21/2022 Diagnosis: I50.33 Acute on chronic diastolic heart failure; M09.4 COPD How have you been since you were released from the hospital? "Doing pretty good; PT came out today" Any questions or concerns? No  Items Reviewed: Did the pt receive and understand the discharge instructions provided? Yes  Medications obtained and verified? Yes  Other? No  Any new allergies since your discharge? No  Dietary orders reviewed? No Do you have support at home? Yes ; significant other  Home Care and Equipment/Supplies: Were home health services ordered? yes If so, what is the name of the agency? [facility]  Has the agency set up a time to come to the patient's home? yes Were any new equipment or medical supplies ordered?  No What is the name of the medical supply agency? N/A Were you able to get the supplies/equipment? not applicable Do you have any questions related to the use of the equipment or supplies? No  Functional Questionnaire: (I = Independent and D = Dependent) ADLs: I  Bathing/Dressing- I  Meal Prep- I  Eating- I  Maintaining continence- D  Transferring/Ambulation- I, walker and wheelchair  Managing Meds- I  Follow up appointments reviewed:  PCP Hospital f/u appt confirmed? Yes  Scheduled to see [name], MD on 07/30/2022 @ 10:00 am. [facility] f/u appt confirmed? Yes  Scheduled to see on Kara Mead, MD @ 07/31/2022 at 9:30 am. Are transportation arrangements needed? Yes ; no problem with transportation If their condition worsens, is the pt aware to call PCP or go to the Emergency Dept.? Yes Was the patient provided with contact information for the PCP's office or ED? Yes Was to pt encouraged to call back with questions or concerns? Yes  Keano Guggenheim N. Lowell Guitar, [place] Advisor, Embedded Care Coordination [place] [facility] Care Management   Site: [facility] [place]: [phone] Website: [email or web address]

## 2022-07-23 NOTE — Telephone Encounter (Signed)
Jasmine [name] with [facility] called in with a critical for this patient during a home visit. Patients oxygen drops down to as low as 70 while walking and talking but is not in distress. O2 reads 93 at rest, weight is 121lb, and he suspects more than two edema bilateral. Patient has started the prednisone today but would like to continue zithromax and citalopram and will need new orders. Patient will be will be admitted her for PT and OT.

## 2022-07-23 NOTE — Telephone Encounter (Signed)
Sounds good, I sent the refills

## 2022-07-24 ENCOUNTER — Encounter: Payer: Self-pay | Admitting: *Deleted

## 2022-07-24 ENCOUNTER — Telehealth: Payer: Self-pay | Admitting: *Deleted

## 2022-07-24 NOTE — Patient Outreach (Signed)
Care Coordination   Initial Visit Note   07/24/2022 Name: Jasmine [name] MRN: [id number] DOB: 1946-02-04  Jasmine BARTLE is a 76 y.o. year old female who sees Jasmine Borg, MD for primary care. I spoke with  Jasmine [name] by phone today.  What matters to the patients health and wellness today?  "I wanted to ask you about having a Education officer, museum come into my home to help me complete my living will; I just got out of the hospital and the home health people have been here and filled up my pill box, I hope it is right.  I have 2 doctor appointments next week, I think my ex-husband whom I live with will take me, he usually does, so I hope he can take me.... I will ask Dr. Jenny [name] about whether or not I can take the flu vaccine since I was just in the hospital"    Goals Addressed             This Visit's Progress    COMPLETED: Care Coordination Activities: No follow up required   On track    Care Coordination Interventions: Evaluation of current treatment plan related to COPD and patient's adherence to plan as established by provider Advised patient to provide appropriate vaccination information to provider or CM team member at next visit Advised patient to obtain flu vaccine for 2023-24 flu/ winter season; provided education/ reminder around good infection prevention practices during high risk months Provided education to patient and/or caregiver about advanced directives Provided education to patient re: role of Community Palliative Care team, currently involved in her care- [facility], provided contact information for Palliative Care team [phone]) and encouraged her to call to discuss setting up a time for the palliative care social worker to visit her to assist her in making decisions around creating her advanced directives-- she reports she has an attorney but has questions before finalizing the living will Reviewed medications with patient and discussed recent medication changes  post-hospital discharge- she tells me that she has a home health team that has fixed her pill box for the upcoming week, however, she seems confused about her medications, specifically the diuretic-- I reviewed recent hospital discharge instructions with her and encouraged her to take the medications as they were filled in her pill box by the home health nurse-- I encouraged her to contact her home health nurse if she has questions about the current filling of the pill box, which they completed "today" according to the patient   Provided patient and/or caregiver with transportation information about possible transportation resources- provided resources in February 2023 and re-confirmed for patient that she should call her insurance company to arrange transportation using that benefit IF her ex-husband is unable to provide transportation (he normally provides her transportation)-  Advice worker) Reviewed scheduled/upcoming provider appointments including Wednesday July 30, 2022 PCP, and Thursday July 31, 2022- pulmonary post-hospital discharge appointments- stressed the importance of attending these visits as scheduled with patient and reinforced several times that she should confirm with her ex-husband if he is able to take her-- and if not to promptly contact her insurance provider to arrange- she verbalizes understanding of same Assessed social determinant of health barriers Confirmed patient currently has home health services in place, she is unable to tell me the name of the agency, but confirms they are currently active post-hospital discharge         SDOH assessments and interventions completed:  Yes  SDOH Interventions Today  Flowsheet Row Most Recent Value  SDOH Interventions   Housing Interventions Intervention Not Indicated  [reports resides with her ex-husband]  Transportation Interventions Intervention Not Indicated  [patient reports her ex-husband whom she lives with  provides transportation]       Care Coordination Interventions Activated:  Yes  Care Coordination Interventions:  Yes, provided   Follow up plan: No further intervention required.   Encounter Outcome:  Pt. Visit Completed   Jasmine Rack, RN, BSN, CCRN Alumnus RN CM Care Coordination/ Transition of [place] Management [phone]: direct office

## 2022-07-28 ENCOUNTER — Telehealth: Payer: Self-pay | Admitting: Internal Medicine

## 2022-07-28 NOTE — Telephone Encounter (Signed)
Please advise for verbal orders 

## 2022-07-28 NOTE — Telephone Encounter (Signed)
Yes, ok for verbals

## 2022-07-28 NOTE — Telephone Encounter (Signed)
Verbal orders for occupational therapy 1 week 1 and then 2 weeks 2

## 2022-07-30 ENCOUNTER — Inpatient Hospital Stay: Payer: TRICARE For Life (TFL) | Admitting: Internal Medicine

## 2022-07-30 NOTE — Telephone Encounter (Signed)
Informed Will of Dr.John's ok for verbal orders.

## 2022-07-31 ENCOUNTER — Ambulatory Visit (INDEPENDENT_AMBULATORY_CARE_PROVIDER_SITE_OTHER): Payer: Medicare PPO | Admitting: Internal Medicine

## 2022-07-31 ENCOUNTER — Encounter: Payer: Self-pay | Admitting: Internal Medicine

## 2022-07-31 ENCOUNTER — Inpatient Hospital Stay: Payer: TRICARE For Life (TFL) | Admitting: Pulmonary Disease

## 2022-07-31 VITALS — BP 104/58 | HR 97 | Ht 61.0 in | Wt 116.0 lb

## 2022-07-31 DIAGNOSIS — Z23 Encounter for immunization: Secondary | ICD-10-CM | POA: Diagnosis not present

## 2022-07-31 DIAGNOSIS — J441 Chronic obstructive pulmonary disease with (acute) exacerbation: Secondary | ICD-10-CM | POA: Diagnosis not present

## 2022-07-31 DIAGNOSIS — E089 Diabetes mellitus due to underlying condition without complications: Secondary | ICD-10-CM | POA: Diagnosis not present

## 2022-07-31 DIAGNOSIS — I10 Essential (primary) hypertension: Secondary | ICD-10-CM

## 2022-07-31 MED ORDER — DOCUSATE SODIUM 100 MG PO CAPS: 100.0000 mg | ORAL_CAPSULE | Freq: Two times a day (BID) | ORAL | 5 refills | Status: DC

## 2022-07-31 NOTE — Patient Instructions (Signed)
You had the flu shot today  Ok to take the colace 100 mg up to twice per day as needed for stool softening  Please continue all other medications as before, and refills have been done if requested.  Please have the pharmacy call with any other refills you may need.  Please continue your efforts at being more active, low cholesterol diet, and weight control  Please keep your appointments with your specialists as you may have planned - Dr Elsworth Soho on Oct 27  Please make an Appointment to return in 3 months, or sooner if needed

## 2022-07-31 NOTE — Progress Notes (Signed)
Patient ID: Jasmine [name], female   DOB: September 21, 1946, 76 y.o.   MRN: [id number]        Chief Complaint: follow up post hospn sept 19 - oct 2 with copd exacerbation       HPI:  Jasmine [name] is a 76 y.o. female here overall doing well.  Was hospd as above, tx with IV steroid, o2, nebs and responded well.  Pt denies chest pain, increased sob or doe, wheezing, orthopnea, PND, increased LE swelling, palpitations, dizziness or syncope.   Pt denies polydipsia, polyuria, or new focal neuro s/s.    Pt denies fever, wt loss, night sweats, loss of appetite, or other constitutional symptoms  Has f/u with pulmonary Dr Elsworth Soho oct 27.  Finishing prednisone taper now.  Gaiined 3 lbs with better diet.    For flu shot today   Asks for colace for constipation Wt Readings from Last 3 Encounters:  07/31/22 116 lb (52.6 kg)  07/21/22 116 lb 6.5 oz (52.8 kg)  07/01/22 113 lb 3.2 oz (51.3 kg)   BP Readings from Last 3 Encounters:  07/31/22 (!) 104/58  07/21/22 (!) 100/54  07/01/22 118/64         Past Medical History:  Diagnosis Date   ALLERGIC RHINITIS 05/01/2007   Anxiety    Arthritis    "hands" (12/28/2015)   ASTHMA 05/01/2007   "since I was a child"   Cancer ([place])    LUNG   CHF (congestive heart failure) ([place])    Chronic bronchitis (HCC)    COPD (chronic obstructive pulmonary disease) ([place])    DEPRESSION 07/21/2008   DVT (deep venous thrombosis) ([place]) 1960s   LLE   FATIGUE 10/10/2010   HYPERTENSION 05/01/2007   HYPOTHYROIDISM 08/23/2009   s/p RAI   LUMBAR RADICULOPATHY, RIGHT 08/25/2008   On home oxygen therapy    "3-4L; qd; all the time" (12/28/2015)   OSTEOPOROSIS 05/01/2007   SHOULDER PAIN, LEFT 08/23/2009   SINUSITIS, CHRONIC 10/10/2010   Past Surgical History:  Procedure Laterality Date   APPENDECTOMY     DILATION AND CURETTAGE OF UTERUS  multiple   history of multiple dialations and curettages and miscarriages, unfortunately never carrying a child to term   ECTOPIC PREGNANCY SURGERY  "late  '60s or early '70s   ELECTROCARDIOGRAM  06/20/2006   OOPHORECTOMY Right     reports that she quit smoking about 6 years ago. Her smoking use included cigarettes. She started smoking about 58 years ago. She has a 52.00 pack-year smoking history. She has never used smokeless tobacco. She reports that she does not currently use alcohol. She reports that she does not currently use drugs. family history includes Alcohol abuse in her brother; Asthma in an other family member; Diabetes in her brother; Hypertension in an other family member; Lung cancer in her father; Stroke in her mother; Thyroid disease in her mother. Allergies  Allergen Reactions   Alendronate Sodium Other (See Comments)    Patient does not remember this reaction   Current Outpatient Medications on File Prior to Visit  Medication Sig Dispense Refill   acetaminophen (TYLENOL) 325 MG tablet Take 2 tablets (650 mg total) by mouth every 6 (six) hours as needed for mild pain (or Fever >/= 101).     albuterol (VENTOLIN HFA) 108 (90 Base) MCG/ACT inhaler INHALE 1 TO 2 PUFFS INTO THE LUNGS EVERY 6 HOURS AS NEEDED FOR WHEEZING OR SHORTNESS OF BREATH (Patient taking differently: Inhale 2 puffs into the lungs every 6 (  six) hours as needed for wheezing or shortness of breath.) 18 g 4   aspirin EC 81 MG EC tablet Take 1 tablet (81 mg total) by mouth daily. 30 tablet 0   atorvastatin (LIPITOR) 10 MG tablet TAKE 1 TABLET BY MOUTH DAILY. (Patient taking differently: Take 10 mg by mouth daily.) 90 tablet 1   azithromycin (ZITHROMAX) 250 MG tablet Take 1 tablet (250 mg total) by mouth daily. 5 each 0   citalopram (CELEXA) 20 MG tablet TAKE ONE TABLET BY MOUTH DAILY 90 tablet 3   fluticasone (FLONASE) 50 MCG/ACT nasal spray Place 1 spray into both nostrils daily. 1 mL 2   Fluticasone-Umeclidin-Vilant (TRELEGY ELLIPTA) 200-62.5-25 MCG/ACT AEPB INHALE 1 PUFF INTO THE LUNGS DAILY. 60 each 5   furosemide (LASIX) 20 MG tablet Take 1 tablet (20 mg total) by  mouth daily. 30 tablet 11   guaiFENesin (ROBITUSSIN) 100 MG/5ML liquid Take 5 mLs by mouth every 4 (four) hours as needed for cough or to loosen phlegm. Mucinex chest congestion (liquid)     ipratropium-albuterol (DUONEB) 0.5-2.5 (3) MG/3ML SOLN Take 3 mLs by nebulization every 6 (six) hours as needed. (Patient taking differently: Take 3 mLs by nebulization every 6 (six) hours as needed (for shortness of breath).) 360 mL 11   levothyroxine (SYNTHROID) 75 MCG tablet Take 1 tablet (75 mcg total) by mouth daily before breakfast. TAKE ONE TABLET BY MOUTH EARLY MORNING (Patient taking differently: Take 75 mcg by mouth daily before breakfast.) 90 tablet 3   loratadine (CLARITIN) 10 MG tablet Take 1 tablet (10 mg total) by mouth daily. (Patient taking differently: Take 10 mg by mouth daily as needed for allergies.)     montelukast (SINGULAIR) 10 MG tablet Take 1 tablet (10 mg total) by mouth at bedtime. 30 tablet 3   OXYGEN Inhale 3 L into the lungs continuous. 24/7     potassium chloride (KLOR-CON M) 10 MEQ tablet Take 1 tablet (10 mEq total) by mouth daily. 90 tablet 3   predniSONE (DELTASONE) 10 MG tablet Take prednisone 40 mg (4 tabs) once daily for 3 days then take prednisone 30 mg (3 tabs) once daily for 3 days then take prednisone 20 mg (2 tabs) once daily for 3 days then take prednisone 10 mg (1 tab) once daily for three days. 31 tablet 0   No current facility-administered medications on file prior to visit.        ROS:  All others reviewed and negative.  Objective        PE:  BP (!) 104/58 (BP Location: Left Arm, Patient Position: Sitting, Cuff Size: Normal)   Pulse 97   Ht '5\' 1"'$  (1.549 m)   Wt 116 lb (52.6 kg)   SpO2 (!) 86%   BMI 21.92 kg/m                 Constitutional: Pt appears in NAD               HENT: Head: NCAT.                Right Ear: External ear normal.                 Left Ear: External ear normal.                Eyes: . Pupils are equal, round, and reactive to light.  Conjunctivae and EOM are normal  Nose: without d/c or deformity               Neck: Neck supple. Gross normal ROM               Cardiovascular: Normal rate and regular rhythm.                 Pulmonary/Chest: Effort normal and breath sounds decreased without rales or wheezing.                Abd:  Soft, NT, ND, + BS, no organomegaly               Neurological: Pt is alert. At baseline orientation, motor grossly intact               Skin: Skin is warm. No rashes, no other new lesions, LE edema - none               Psychiatric: Pt behavior is normal without agitation   Micro: none  Cardiac tracings I have personally interpreted today:  none  Pertinent Radiological findings (summarize): none   Lab Results  Component Value Date   WBC 8.3 07/19/2022   HGB 12.2 07/19/2022   HCT 38.5 07/19/2022   PLT 159 07/19/2022   GLUCOSE 138 (H) 07/21/2022   CHOL 188 02/25/2022   TRIG 44.0 02/25/2022   HDL 102.60 02/25/2022   LDLDIRECT 102.1 10/10/2010   LDLCALC 76 02/25/2022   ALT 17 04/30/2022   AST 21 04/30/2022   NA 139 07/21/2022   K 5.2 (H) 07/21/2022   CL 84 (L) 07/21/2022   CREATININE 1.13 (H) 07/21/2022   BUN 28 (H) 07/21/2022   CO2 44 (H) 07/21/2022   TSH 0.052 (L) 04/28/2022   INR 1.58 (H) 03/25/2016   HGBA1C 6.5 (A) 02/25/2022   MICROALBUR 7.2 (H) 02/25/2022   Assessment/Plan:  Jasmine [name] is a 76 y.o. Black or African American [2] female with  has a past medical history of ALLERGIC RHINITIS (05/01/2007), Anxiety, Arthritis, ASTHMA (05/01/2007), Cancer (HCC), CHF (congestive heart failure) ([place]), Chronic bronchitis ([place]), COPD (chronic obstructive pulmonary disease) ([place]), DEPRESSION (07/21/2008), DVT (deep venous thrombosis) ([place]) (1960s), FATIGUE (10/10/2010), HYPERTENSION (05/01/2007), HYPOTHYROIDISM (08/23/2009), LUMBAR RADICULOPATHY, RIGHT (08/25/2008), On home oxygen therapy, OSTEOPOROSIS (05/01/2007), SHOULDER PAIN, LEFT (08/23/2009), and SINUSITIS, CHRONIC  (10/10/2010).  COPD with acute exacerbation (HCC) Stable, cont inhaler, finish prednisone, f/u pulm as planned  Diabetes mellitus due to underlying condition without complication, without long-term current use of insulin (HCC) Lab Results  Component Value Date   HGBA1C 6.5 (A) 02/25/2022   Stable, pt to continue current medical treatment  - diet, wt control, activity   Essential hypertension Low normal, o/w asympt, continue to follow  Followup: Return in about 3 months (around 10/31/2022).  [name], MD 07/31/2022 9:18 PM [facility]

## 2022-07-31 NOTE — Assessment & Plan Note (Signed)
Stable, cont inhaler, finish prednisone, f/u pulm as planned

## 2022-07-31 NOTE — Assessment & Plan Note (Signed)
Lab Results  Component Value Date   HGBA1C 6.5 (A) 02/25/2022   Stable, pt to continue current medical treatment  - diet, wt control, activity

## 2022-07-31 NOTE — Assessment & Plan Note (Signed)
Low normal, o/w asympt, continue to follow

## 2022-08-01 DIAGNOSIS — B9561 Methicillin susceptible Staphylococcus aureus infection as the cause of diseases classified elsewhere: Secondary | ICD-10-CM | POA: Diagnosis not present

## 2022-08-01 DIAGNOSIS — J9612 Chronic respiratory failure with hypercapnia: Secondary | ICD-10-CM | POA: Diagnosis not present

## 2022-08-01 DIAGNOSIS — J449 Chronic obstructive pulmonary disease, unspecified: Secondary | ICD-10-CM | POA: Diagnosis not present

## 2022-08-01 DIAGNOSIS — R7881 Bacteremia: Secondary | ICD-10-CM | POA: Diagnosis not present

## 2022-08-13 NOTE — Telephone Encounter (Signed)
[facility] would like verbal order to extend occupational therapy by 1 time a week for 3 weeks.

## 2022-08-15 ENCOUNTER — Encounter: Payer: Self-pay | Admitting: Adult Health

## 2022-08-15 ENCOUNTER — Ambulatory Visit (INDEPENDENT_AMBULATORY_CARE_PROVIDER_SITE_OTHER): Payer: Medicare PPO | Admitting: Adult Health

## 2022-08-15 ENCOUNTER — Inpatient Hospital Stay: Payer: TRICARE For Life (TFL) | Admitting: Pulmonary Disease

## 2022-08-15 VITALS — BP 124/62 | HR 85 | Temp 98.2°F | Ht 60.0 in | Wt 122.4 lb

## 2022-08-15 DIAGNOSIS — E43 Unspecified severe protein-calorie malnutrition: Secondary | ICD-10-CM

## 2022-08-15 DIAGNOSIS — J441 Chronic obstructive pulmonary disease with (acute) exacerbation: Secondary | ICD-10-CM

## 2022-08-15 DIAGNOSIS — I5032 Chronic diastolic (congestive) heart failure: Secondary | ICD-10-CM | POA: Diagnosis not present

## 2022-08-15 DIAGNOSIS — J9612 Chronic respiratory failure with hypercapnia: Secondary | ICD-10-CM | POA: Diagnosis not present

## 2022-08-15 LAB — BASIC METABOLIC PANEL
BUN: 14 mg/dL (ref 6–23)
CO2: 44 mEq/L — ABNORMAL HIGH (ref 19–32)
Calcium: 9.2 mg/dL (ref 8.4–10.5)
Chloride: 95 mEq/L — ABNORMAL LOW (ref 96–112)
Creatinine, Ser: 0.77 mg/dL (ref 0.40–1.20)
GFR: 74.22 mL/min (ref >60.00)
Glucose, Bld: 106 mg/dL — ABNORMAL HIGH (ref 70–99)
Potassium: 4.8 mEq/L (ref 3.5–5.1)
Sodium: 141 mEq/L (ref 135–145)

## 2022-08-15 LAB — BRAIN NATRIURETIC PEPTIDE: Pro B Natriuretic peptide (BNP): 13 pg/mL (ref 0.0–100.0)

## 2022-08-15 NOTE — Assessment & Plan Note (Signed)
Chronic hypoxic and hypercarbic respiratory failure.  Continue on oxygen to maintain O2 saturation greater than 88 to 9%.  Continue on nocturnal trilogy device  Plan Patient Instructions  Set up CT chest without contrast.   Continue on Trelegy 1 puff daily.  May use Duoneb nebs every 6hr As needed  Wheezing /shortness of breath .  Continue on Oxygen 4l/m rest and 6l/m with activity .  Wear Trilogy noninvasive vent each night and with naps.  Take extra Lasix '20mg'$  daily for next 3 days then once daily  Labs today .  Follow up Dr. Elsworth Soho  In 4 weeks  and As needed   Please contact office for sooner follow up if symptoms do not improve or worsen or seek emergency care

## 2022-08-15 NOTE — Progress Notes (Signed)
_0  ID: Jasmine [name], female    DOB: 06-09-1946, 76 y.o.   MRN: [id number]  Chief Complaint  Patient presents with   Follow-up    Referring provider: Biagio Borg, MD  HPI: 76 year old female former smoker followed for very severe COPD, chronic hypoxic and hypercarbic respiratory failure on oxygen.  History of lung nodules.  Patient is prone to recurrent COPD exacerbations and hospitalizations.  Maintained on oxygen since 2014 History of polysubstance abuse with cocaine  TEST/EVENTS :  CT chest 10/2011 >> showed Moderate centrilobular emphysema. Small bilateral pleural effusions with dependent atelectasis   CT chest 07/2016-resolving right upper lobe consolidation, spiculated subcentimeter nodules that have been noted before with low-grade PET hypermetabolism  CT chest February 2019 stable right upper lobe scarring, stable bilateral pulmonary nodules, emphysema    CT chest  11/2018>> stable nodules, unchanged bandlike scarring in the inferior right upper lobe stable  Stable small pulmonary nodules, likely benign sequelae of prior infection. No further routine CT follow-up is required for these Nodules.  CT chest July 30, 2021 8 mm right upper lobe nodule, 1.1 cm left upper lobe nodule  CT chest April 27, 2022 1 cm left upper lobe nodule, 2 mm left upper lobe nodule scarring right upper lobe, 6 mm nodule right upper lobe    PFT  >FEV1 at 0.47 L( 30%), ratio 39 , No BD response (<200cc)  08/15/2022 Follow up : COPD , hypoxic and hypercarbic respiratory failure Patient returns for a posthospital follow-up.  She was recently hospitalized for COPD exacerbation.  She was treated with empiric IV antibiotics, steroids nebulized bronchodilators.  Transition to a Z-Pak and prednisone taper at discharge. She remains on oxygen 4 L at rest and 6 L with activity.  Palliative care is following at home.  Patient is on noninvasive vent with trilogy device at bedtime with oxygen.  She says  she wears it every single night.  Download shows good compliance.  Daily average usage around 5 to 6 hours. Since discharge patient says she is feeling better.  Cough and congestion have decreased.  Breathing seems to be getting back to baseline. Echo showed preserved EF and grade 1 diastolic dysfunction.  Allergies  Allergen Reactions   Alendronate Sodium Other (See Comments)    Patient does not remember this reaction    Immunization History  Administered Date(s) Administered   Fluad Quad(high Dose 65+) 08/30/2019, 08/14/2020, 08/01/2021, 07/31/2022   Influenza Split 07/10/2011, 08/18/2012   Influenza Whole 07/21/2008, 08/23/2009, 10/10/2010   Influenza, High Dose Seasonal PF 07/10/2016, 09/03/2017   Influenza,inj,Quad PF,6+ Mos 08/12/2013, 07/10/2014, 07/08/2018   PFIZER(Purple Top)SARS-COV-2 Vaccination 12/24/2019, 01/14/2020, 11/22/2020   Pneumococcal Conjugate-13 11/30/2014   Pneumococcal Polysaccharide-23 07/21/2007, 01/12/2013, 06/16/2018   Td 08/23/2009   Zoster, Live 07/20/2006    Past Medical History:  Diagnosis Date   ALLERGIC RHINITIS 05/01/2007   Anxiety    Arthritis    "hands" (12/28/2015)   ASTHMA 05/01/2007   "since I was a child"   Cancer ([place])    LUNG   CHF (congestive heart failure) ([place])    Chronic bronchitis ([place])    COPD (chronic obstructive pulmonary disease) ([place])    DEPRESSION 07/21/2008   DVT (deep venous thrombosis) ([place]) 1960s   LLE   FATIGUE 10/10/2010   HYPERTENSION 05/01/2007   HYPOTHYROIDISM 08/23/2009   s/p RAI   LUMBAR RADICULOPATHY, RIGHT 08/25/2008   On home oxygen therapy    "3-4L; qd; all the time" (12/28/2015)   OSTEOPOROSIS 05/01/2007  SHOULDER PAIN, LEFT 08/23/2009   SINUSITIS, CHRONIC 10/10/2010    Tobacco History: Social History   Tobacco Use  Smoking Status Former   Packs/day: 1.00   Years: 52.00   Total pack years: 52.00   Types: Cigarettes   Start date: 1965   Quit date: 07/20/2016   Years since quitting: 6.0   Smokeless Tobacco Never   Counseling given: Not Answered   Outpatient Medications Prior to Visit  Medication Sig Dispense Refill   acetaminophen (TYLENOL) 325 MG tablet Take 2 tablets (650 mg total) by mouth every 6 (six) hours as needed for mild pain (or Fever >/= 101).     albuterol (VENTOLIN HFA) 108 (90 Base) MCG/ACT inhaler INHALE 1 TO 2 PUFFS INTO THE LUNGS EVERY 6 HOURS AS NEEDED FOR WHEEZING OR SHORTNESS OF BREATH (Patient taking differently: Inhale 2 puffs into the lungs every 6 (six) hours as needed for wheezing or shortness of breath.) 18 g 4   aspirin EC 81 MG EC tablet Take 1 tablet (81 mg total) by mouth daily. 30 tablet 0   atorvastatin (LIPITOR) 10 MG tablet TAKE 1 TABLET BY MOUTH DAILY. (Patient taking differently: Take 10 mg by mouth daily.) 90 tablet 1   citalopram (CELEXA) 20 MG tablet TAKE ONE TABLET BY MOUTH DAILY 90 tablet 3   docusate sodium (COLACE) 100 MG capsule Take 1 capsule (100 mg total) by mouth 2 (two) times daily. 60 capsule 5   fluticasone (FLONASE) 50 MCG/ACT nasal spray Place 1 spray into both nostrils daily. 1 mL 2   Fluticasone-Umeclidin-Vilant (TRELEGY ELLIPTA) 200-62.5-25 MCG/ACT AEPB INHALE 1 PUFF INTO THE LUNGS DAILY. 60 each 5   furosemide (LASIX) 20 MG tablet Take 1 tablet (20 mg total) by mouth daily. 30 tablet 11   guaiFENesin (ROBITUSSIN) 100 MG/5ML liquid Take 5 mLs by mouth every 4 (four) hours as needed for cough or to loosen phlegm. Mucinex chest congestion (liquid)     ipratropium-albuterol (DUONEB) 0.5-2.5 (3) MG/3ML SOLN Take 3 mLs by nebulization every 6 (six) hours as needed. (Patient taking differently: Take 3 mLs by nebulization every 6 (six) hours as needed (for shortness of breath).) 360 mL 11   levothyroxine (SYNTHROID) 75 MCG tablet Take 1 tablet (75 mcg total) by mouth daily before breakfast. TAKE ONE TABLET BY MOUTH EARLY MORNING (Patient taking differently: Take 75 mcg by mouth daily before breakfast.) 90 tablet 3   loratadine  (CLARITIN) 10 MG tablet Take 1 tablet (10 mg total) by mouth daily. (Patient taking differently: Take 10 mg by mouth daily as needed for allergies.)     montelukast (SINGULAIR) 10 MG tablet Take 1 tablet (10 mg total) by mouth at bedtime. 30 tablet 3   OXYGEN Inhale 3 L into the lungs continuous. 24/7     potassium chloride (KLOR-CON M) 10 MEQ tablet Take 1 tablet (10 mEq total) by mouth daily. 90 tablet 3   azithromycin (ZITHROMAX) 250 MG tablet Take 1 tablet (250 mg total) by mouth daily. (Patient not taking: Reported on 08/15/2022) 5 each 0   predniSONE (DELTASONE) 10 MG tablet Take prednisone 40 mg (4 tabs) once daily for 3 days then take prednisone 30 mg (3 tabs) once daily for 3 days then take prednisone 20 mg (2 tabs) once daily for 3 days then take prednisone 10 mg (1 tab) once daily for three days. (Patient not taking: Reported on 08/15/2022) 31 tablet 0   No facility-administered medications prior to visit.     Review of  Systems:   Constitutional:   No  weight loss, night sweats,  Fevers, chills, +fatigue, or  lassitude.  HEENT:   No headaches,  Difficulty swallowing,  Tooth/dental problems, or  Sore throat,                No sneezing, itching, ear ache, nasal congestion, post nasal drip,   CV:  No chest pain,  Orthopnea, PND, swelling in lower extremities, anasarca, dizziness, palpitations, syncope.   GI  No heartburn, indigestion, abdominal pain, nausea, vomiting, diarrhea, change in bowel habits, loss of appetite, bloody stools.   Resp: .  No chest wall deformity  Skin: no rash or lesions.  GU: no dysuria, change in color of urine, no urgency or frequency.  No flank pain, no hematuria   MS:  No joint pain or swelling.  No decreased range of motion.  No back pain.    Physical Exam  BP 124/62 (BP Location: Right Arm, Patient Position: Sitting, Cuff Size: Normal)   Pulse 85   Temp 98.2 F (36.8 C) (Oral)   Ht 5' (1.524 m)   Wt 122 lb 6.4 oz (55.5 kg)   SpO2 100%  Comment: 4 liters oxygen continuous  BMI 23.90 kg/m   GEN: A/Ox3; pleasant , NAD, chronically ill-appearing, wheelchair, oxygen, frail and elderly  HEENT:  [place]/AT,  EACs-clear, TMs-wnl, NOSE-clear, THROAT-clear, no lesions, no postnasal drip or exudate noted.   NECK:  Supple w/ fair ROM; no JVD; normal carotid impulses w/o bruits; no thyromegaly or nodules palpated; no lymphadenopathy.    RESP  Clear  P & A; w/o, wheezes/ rales/ or rhonchi. no accessory muscle use, no dullness to percussion  CARD:  RRR, no m/r/g, 2-3+  peripheral edema, pulses intact, no cyanosis or clubbing.  GI:   Soft & nt; nml bowel sounds; no organomegaly or masses detected.   Musco: Warm bil, no deformities or joint swelling noted.   Neuro: alert, no focal deficits noted.    Skin: Warm, no lesions or rashes    Lab Results:    BNP    Imaging: DG Chest Port 1 View  Result Date: 07/18/2022 CLINICAL DATA:  Dyspnea EXAM: PORTABLE CHEST 1 VIEW COMPARISON:  04/27/2022 FINDINGS: Stable parenchymal scarring within the right mid lung zone. Interval development of a small left pleural effusion. There is progressive cephalization of the pulmonary vasculature and development of trace interstitial pulmonary infiltrate in keeping with developing mild cardiogenic failure or volume overload. No pneumothorax. Cardiac size within normal limits. No acute bone abnormality. IMPRESSION: Interval development of mild cardiogenic failure or volume overload. Electronically Signed   By: Fidela Salisbury M.D.   On: 07/18/2022 03:24          No data to display          No results found for: "NITRICOXIDE"      Assessment & Plan:   COPD exacerbation ([place]) Recent COPD exacerbation requiring hospitalization.  Patient is prone to frequent flares. Continue on nocturnal trilogy device.  Plan  Patient Instructions  Set up CT chest without contrast.   Continue on Trelegy 1 puff daily.  May use Duoneb nebs every 6hr As  needed  Wheezing /shortness of breath .  Continue on Oxygen 4l/m rest and 6l/m with activity .  Wear Trilogy noninvasive vent each night and with naps.  Take extra Lasix 35m daily for next 3 days then once daily  Labs today .  Follow up Dr. AElsworth Soho In 4 weeks  and  As needed   Please contact office for sooner follow up if symptoms do not improve or worsen or seek emergency care              Chronic diastolic congestive heart failure (HCC) Appears volume overloaded on exam today with 2-3+ edema in lower extremities.  Will increase Lasix for the next 3 days.  Check labs today with BNP and be met.  Plan  Patient Instructions  Set up CT chest without contrast.   Continue on Trelegy 1 puff daily.  May use Duoneb nebs every 6hr As needed  Wheezing /shortness of breath .  Continue on Oxygen 4l/m rest and 6l/m with activity .  Wear Trilogy noninvasive vent each night and with naps.  Take extra Lasix 22m daily for next 3 days then once daily  Labs today .  Follow up Dr. AElsworth Soho In 4 weeks  and As needed   Please contact office for sooner follow up if symptoms do not improve or worsen or seek emergency care              Respiratory failure with hypercapnia ([place] Chronic hypoxic and hypercarbic respiratory failure.  Continue on oxygen to maintain O2 saturation greater than 88 to 9%.  Continue on nocturnal trilogy device  Plan Patient Instructions  Set up CT chest without contrast.   Continue on Trelegy 1 puff daily.  May use Duoneb nebs every 6hr As needed  Wheezing /shortness of breath .  Continue on Oxygen 4l/m rest and 6l/m with activity .  Wear Trilogy noninvasive vent each night and with naps.  Take extra Lasix 276mdaily for next 3 days then once daily  Labs today .  Follow up Dr. AlElsworth SohoIn 4 weeks  and As needed   Please contact office for sooner follow up if symptoms do not improve or worsen or seek emergency care              Protein-calorie malnutrition,  severe (HCGatesHigh-protein diet.    I spent  42  minutes dedicated to the care of this patient on the date of this encounter to include pre-visit review of records, face-to-face time with the patient discussing conditions above, post visit ordering of testing, clinical documentation with the electronic health record, making appropriate referrals as documented, and communicating necessary findings to members of the patients care team.   TaRexene EdisonNP 08/15/2022

## 2022-08-15 NOTE — Assessment & Plan Note (Signed)
High-protein diet 

## 2022-08-15 NOTE — Assessment & Plan Note (Signed)
Recent COPD exacerbation requiring hospitalization.  Patient is prone to frequent flares. Continue on nocturnal trilogy device.  Plan  Patient Instructions  Set up CT chest without contrast.   Continue on Trelegy 1 puff daily.  May use Duoneb nebs every 6hr As needed  Wheezing /shortness of breath .  Continue on Oxygen 4l/m rest and 6l/m with activity .  Wear Trilogy noninvasive vent each night and with naps.  Take extra Lasix '20mg'$  daily for next 3 days then once daily  Labs today .  Follow up Dr. Elsworth Soho  In 4 weeks  and As needed   Please contact office for sooner follow up if symptoms do not improve or worsen or seek emergency care

## 2022-08-15 NOTE — Telephone Encounter (Signed)
Home health is now requesting an extension for OT for 1 time a week for 3 weeks.

## 2022-08-15 NOTE — Assessment & Plan Note (Signed)
Appears volume overloaded on exam today with 2-3+ edema in lower extremities.  Will increase Lasix for the next 3 days.  Check labs today with BNP and be met.  Plan  Patient Instructions  Set up CT chest without contrast.   Continue on Trelegy 1 puff daily.  May use Duoneb nebs every 6hr As needed  Wheezing /shortness of breath .  Continue on Oxygen 4l/m rest and 6l/m with activity .  Wear Trilogy noninvasive vent each night and with naps.  Take extra Lasix 35m daily for next 3 days then once daily  Labs today .  Follow up Dr. AElsworth Soho In 4 weeks  and As needed   Please contact office for sooner follow up if symptoms do not improve or worsen or seek emergency care

## 2022-08-15 NOTE — Patient Instructions (Addendum)
Set up CT chest without contrast.   Continue on Trelegy 1 puff daily.  May use Duoneb nebs every 6hr As needed  Wheezing /shortness of breath .  Continue on Oxygen 4l/m rest and 6l/m with activity .  Wear Trilogy noninvasive vent each night and with naps.  Take extra Lasix '20mg'$  daily for next 3 days then once daily  Labs today .  Follow up Dr. Elsworth Soho  In 4 weeks  and As needed   Please contact office for sooner follow up if symptoms do not improve or worsen or seek emergency care

## 2022-08-15 NOTE — Telephone Encounter (Signed)
Will at home health informed

## 2022-08-15 NOTE — Telephone Encounter (Signed)
Ok for verbals 

## 2022-08-18 ENCOUNTER — Other Ambulatory Visit: Payer: Self-pay | Admitting: Internal Medicine

## 2022-08-18 NOTE — Telephone Encounter (Signed)
Please refill as per office routine med refill policy (all routine meds to be refilled for 3 mo or monthly (per pt preference) up to one year from last visit, then month to month grace period for 3 mo, then further med refills will have to be denied) ? ?

## 2022-08-19 NOTE — Progress Notes (Signed)
ATC x2.  LVM to return call.

## 2022-08-20 ENCOUNTER — Ambulatory Visit (HOSPITAL_COMMUNITY): Payer: Medicare PPO | Attending: Adult Health

## 2022-08-25 ENCOUNTER — Encounter: Payer: Self-pay | Admitting: *Deleted

## 2022-08-25 NOTE — Progress Notes (Signed)
Unable to reach letter sent

## 2022-08-27 ENCOUNTER — Ambulatory Visit (INDEPENDENT_AMBULATORY_CARE_PROVIDER_SITE_OTHER): Payer: Medicare PPO | Admitting: Internal Medicine

## 2022-08-27 ENCOUNTER — Encounter: Payer: Self-pay | Admitting: Internal Medicine

## 2022-08-27 VITALS — BP 110/66 | HR 96 | Ht 60.0 in | Wt 121.0 lb

## 2022-08-27 DIAGNOSIS — E890 Postprocedural hypothyroidism: Secondary | ICD-10-CM | POA: Diagnosis not present

## 2022-08-27 DIAGNOSIS — E119 Type 2 diabetes mellitus without complications: Secondary | ICD-10-CM | POA: Diagnosis not present

## 2022-08-27 LAB — T4, FREE: Free T4: 0.93 ng/dL (ref 0.60–1.60)

## 2022-08-27 LAB — POCT GLUCOSE (DEVICE FOR HOME USE): POC Glucose: 128 mg/dl — AB (ref 70–99)

## 2022-08-27 LAB — POCT GLYCOSYLATED HEMOGLOBIN (HGB A1C): Hemoglobin A1C: 5.9 % — AB (ref 4.0–5.6)

## 2022-08-27 LAB — TSH: TSH: 0.61 u[IU]/mL (ref 0.35–5.50)

## 2022-08-27 NOTE — Patient Instructions (Signed)

## 2022-08-27 NOTE — Progress Notes (Signed)
Name: Jasmine [name]  MRN/ DOB: [id number], May 16, 1946   Age/ Sex: 76 y.o., female    PCP: Biagio Borg, MD   Reason for Endocrinology Evaluation: Type 2 Diabetes Mellitus     Date of Initial Endocrinology Visit: 07/22/2021    PATIENT IDENTIFIER: Jasmine [name] is a 76 y.o. female with a past medical history of T2DM , COPD, Osteoporosis, and postablative Hypothyroidism . The patient presented for initial endocrinology clinic visit on 07/22/2021 for consultative assistance with her diabetes management.    HPI: Jasmine [name] was    Diagnosed with DM 2021 Prior Medications tried/Intolerance: N/A           Hemoglobin A1c has ranged from 6.3% in 2021, peaking at 6.6% in  2022   THYROID HISTORY : She has a hx of Graves' Disease many years ago, she is S/P RAI ablation in 2006   She is on Levothyroxine     SUBJECTIVE:   During last visit (02/25/2022): A1c 6.3%      Today (08/27/22):  Jasmine [name] is here for a follow up on diabetes management and hypothyroidism. She does not check glucose at home   She is compliant with levothyroxine intake  She has noted weight loss and decrease appetite  Denies diarrhea but has constipation , takes stool softeners  Has occasional palpitations  SOB is stable on Oxygen  Continues with fatigue and excessive daytime sleepiness    HOME ENDOCRINE  REGIMEN: Levothyroxine 75 mcg daily      Statin: yes ACE-I/ARB: no    DIABETIC COMPLICATIONS: Microvascular complications:  Denies: CKD,  Last Eye Exam: Completed    Macrovascular complications:   Denies: CAD, CVA, PVD    PAST HISTORY: Past Medical History:  Past Medical History:  Diagnosis Date   ALLERGIC RHINITIS 05/01/2007   Anxiety    Arthritis    "hands" (12/28/2015)   ASTHMA 05/01/2007   "since I was a child"   Cancer ([place])    LUNG   CHF (congestive heart failure) ([place])    Chronic bronchitis (H[facility])    COPD (chronic obstructive pulmonary disease) ([place])    DEPRESSION  07/21/2008   DVT (deep venous thrombosis) ([place]) 1960s   LLE   FATIGUE 10/10/2010   HYPERTENSION 05/01/2007   HYPOTHYROIDISM 08/23/2009   s/p RAI   LUMBAR RADICULOPATHY, RIGHT 08/25/2008   On home oxygen therapy    "3-4L; qd; all the time" (12/28/2015)   OSTEOPOROSIS 05/01/2007   SHOULDER PAIN, LEFT 08/23/2009   SINUSITIS, CHRONIC 10/10/2010   Past Surgical History:  Past Surgical History:  Procedure Laterality Date   APPENDECTOMY     DILATION AND CURETTAGE OF UTERUS  multiple   history of multiple dialations and curettages and miscarriages, unfortunately never carrying a child to term   ECTOPIC PREGNANCY SURGERY  "late '60s or early '70s   ELECTROCARDIOGRAM  06/20/2006   OOPHORECTOMY Right     Social History:  reports that she quit smoking about 6 years ago. Her smoking use included cigarettes. She started smoking about 58 years ago. She has a 52.00 pack-year smoking history. She has never used smokeless tobacco. She reports that she does not currently use alcohol. She reports that she does not currently use drugs. Family History:  Family History  Problem Relation Age of Onset   Lung cancer Father    Alcohol abuse Brother    Diabetes Brother    Hypertension Other    Stroke Mother    Thyroid disease Mother  Asthma Other        maternal aunts   Breast cancer Neg Hx      HOME MEDICATIONS: Allergies as of 08/27/2022       Reactions   Alendronate Sodium Other (See Comments)   Patient does not remember this reaction        Medication List        Accurate as of August 27, 2022  1:57 PM. If you have any questions, ask your nurse or doctor.          STOP taking these medications    guaiFENesin 100 MG/5ML liquid Commonly known as: ROBITUSSIN Stopped by: Dorita Sciara, MD   predniSONE 10 MG tablet Commonly known as: DELTASONE Stopped by: Dorita Sciara, MD       TAKE these medications    acetaminophen 325 MG tablet Commonly known as:  TYLENOL Take 2 tablets (650 mg total) by mouth every 6 (six) hours as needed for mild pain (or Fever >/= 101).   albuterol 108 (90 Base) MCG/ACT inhaler Commonly known as: VENTOLIN HFA INHALE 1 TO 2 PUFFS INTO THE LUNGS EVERY 6 HOURS AS NEEDED FOR WHEEZING OR SHORTNESS OF BREATH   aspirin EC 81 MG tablet Take 1 tablet (81 mg total) by mouth daily.   atorvastatin 10 MG tablet Commonly known as: LIPITOR TAKE 1 TABLET (10 MG TOTAL) BY MOUTH DAILY.   azithromycin 250 MG tablet Commonly known as: Zithromax Take 1 tablet (250 mg total) by mouth daily.   citalopram 20 MG tablet Commonly known as: CELEXA TAKE ONE TABLET BY MOUTH DAILY   docusate sodium 100 MG capsule Commonly known as: Colace Take 1 capsule (100 mg total) by mouth 2 (two) times daily.   fluticasone 50 MCG/ACT nasal spray Commonly known as: FLONASE Place 1 spray into both nostrils daily.   furosemide 20 MG tablet Commonly known as: Lasix Take 1 tablet (20 mg total) by mouth daily.   ipratropium-albuterol 0.5-2.5 (3) MG/3ML Soln Commonly known as: DUONEB Take 3 mLs by nebulization every 6 (six) hours as needed. What changed: reasons to take this   levothyroxine 75 MCG tablet Commonly known as: SYNTHROID Take 1 tablet (75 mcg total) by mouth daily before breakfast. TAKE ONE TABLET BY MOUTH EARLY MORNING What changed: additional instructions   loratadine 10 MG tablet Commonly known as: CLARITIN Take 1 tablet (10 mg total) by mouth daily. What changed:  when to take this reasons to take this   montelukast 10 MG tablet Commonly known as: SINGULAIR Take 1 tablet (10 mg total) by mouth at bedtime.   OXYGEN Inhale 3 L into the lungs continuous. 24/7   potassium chloride 10 MEQ tablet Commonly known as: KLOR-CON M Take 1 tablet (10 mEq total) by mouth daily.   Trelegy Ellipta 200-62.5-25 MCG/ACT Aepb Generic drug: Fluticasone-Umeclidin-Vilant INHALE 1 PUFF INTO THE LUNGS DAILY.          ALLERGIES: Allergies  Allergen Reactions   Alendronate Sodium Other (See Comments)    Patient does not remember this reaction     REVIEW OF SYSTEMS: A comprehensive ROS was conducted with the patient and is negative except as per HPI    OBJECTIVE:   VITAL SIGNS: BP 110/66 (BP Location: Left Arm, Patient Position: Sitting, Cuff Size: Small)   Pulse 96   Ht 5' (1.524 m)   Wt 121 lb (54.9 kg)   SpO2 92%   BMI 23.63 kg/m    PHYSICAL EXAM:  General: Pt  appears well and is in NAD  Neck: General: Supple without adenopathy or carotid bruits. Thyroid: Thyroid size normal.  No goiter or nodules appreciated.  Lungs: Clear with good BS bilat with no rales, rhonchi, or wheezes  Heart: RRR   Extremities:  Lower extremities : +1   pretibial edema.   Neuro: MS is good with appropriate affect, pt is alert and Ox3    DM Foot Exam 02/25/2022 The skin of the feet is without sores or ulcerations, toenails thickened and curved The pedal pulses are 1+ on right and 1+ on left. The sensation is absent to a screening 5.07, 10 gram monofilament bilaterally      DATA REVIEWED:  Lab Results  Component Value Date   HGBA1C 5.9 (A) 08/27/2022   HGBA1C 6.5 (A) 02/25/2022   HGBA1C 6.3 04/19/2021    Latest Reference Range & Units 08/27/22 14:09  TSH 0.35 - 5.50 uIU/mL 0.61  T4,Free(Direct) 0.60 - 1.60 ng/dL 0.93      In office BG 128 mg/dL     ASSESSMENT / PLAN / RECOMMENDATIONS:   1) Postablative Hypothyroidism:   -Pt with fatigue -TSH is  at the low end of normal, will reduce as below    Medication  Stop  levothyroxine 75 mcg daily  Start Levothyroxine 50 mcg daily     2) Type 2 Diabetes Mellitus, optimally controlled, with neuropathic complications: - Most recent A1c of 5.9 %. Goal A1c < 7.0 %.    -A1c continues to be at goal without any glycemic agents -Emphasized the importance of avoiding sugar sweetened beverages   MEDICATIONS: N/A  F/U in 6 months     Signed electronically by: Mack Guise, MD  [facility]  [facility] [address] [place], [place] [address] Phone: [phone] FAX: [phone]   CC: Biagio Borg, [place] [address] Phone: [phone]  Fax: [phone]    Return to Endocrinology clinic as below: Future Appointments  Date Time Provider [place]  09/30/2022  2:00 PM Gayland Curry, DO [facility] None  10/31/2022  2:20 PM Biagio Borg, MD [facility] None  02/02/2023  8:15 AM [facility] NURSE 2 [facility] None

## 2022-08-28 ENCOUNTER — Telehealth: Payer: Self-pay | Admitting: Adult Health

## 2022-08-29 ENCOUNTER — Telehealth: Payer: Self-pay | Admitting: Internal Medicine

## 2022-08-29 MED ORDER — LEVOTHYROXINE SODIUM 50 MCG PO TABS: 50.0000 ug | ORAL_TABLET | Freq: Every day | ORAL | 3 refills | Status: DC

## 2022-08-29 NOTE — Telephone Encounter (Signed)
Please let the pt know that she is still on a little bit too much levothyroxine, needs to stop Levothyroxine 75 mcg and START levothyroxine 50 mcg daily    Thanks

## 2022-08-29 NOTE — Telephone Encounter (Signed)
LMTCB

## 2022-08-29 NOTE — Telephone Encounter (Signed)
Pt called back and I let her know of when her CT appt was. Also scheduled f/u for pt with Dr. Elsworth Soho after scan. Nothing further needed.

## 2022-09-01 ENCOUNTER — Inpatient Hospital Stay (HOSPITAL_COMMUNITY)
Admission: EM | Admit: 2022-09-01 | Discharge: 2022-09-04 | DRG: 190 | Disposition: A | Discharge status: Home Health | Payer: Medicare PPO | Attending: Internal Medicine | Admitting: Internal Medicine

## 2022-09-01 ENCOUNTER — Emergency Department (HOSPITAL_COMMUNITY): Payer: Medicare PPO

## 2022-09-01 ENCOUNTER — Other Ambulatory Visit: Payer: Self-pay

## 2022-09-01 ENCOUNTER — Other Ambulatory Visit (HOSPITAL_COMMUNITY): Payer: TRICARE For Life (TFL)

## 2022-09-01 DIAGNOSIS — E039 Hypothyroidism, unspecified: Secondary | ICD-10-CM | POA: Diagnosis present

## 2022-09-01 DIAGNOSIS — Z6823 Body mass index (BMI) 23.0-23.9, adult: Secondary | ICD-10-CM

## 2022-09-01 DIAGNOSIS — M810 Age-related osteoporosis without current pathological fracture: Secondary | ICD-10-CM | POA: Diagnosis present

## 2022-09-01 DIAGNOSIS — G4733 Obstructive sleep apnea (adult) (pediatric): Secondary | ICD-10-CM | POA: Diagnosis present

## 2022-09-01 DIAGNOSIS — Z825 Family history of asthma and other chronic lower respiratory diseases: Secondary | ICD-10-CM

## 2022-09-01 DIAGNOSIS — Z85118 Personal history of other malignant neoplasm of bronchus and lung: Secondary | ICD-10-CM

## 2022-09-01 DIAGNOSIS — B9561 Methicillin susceptible Staphylococcus aureus infection as the cause of diseases classified elsewhere: Secondary | ICD-10-CM | POA: Diagnosis not present

## 2022-09-01 DIAGNOSIS — F32A Depression, unspecified: Secondary | ICD-10-CM | POA: Diagnosis present

## 2022-09-01 DIAGNOSIS — J449 Chronic obstructive pulmonary disease, unspecified: Secondary | ICD-10-CM | POA: Diagnosis not present

## 2022-09-01 DIAGNOSIS — J309 Allergic rhinitis, unspecified: Secondary | ICD-10-CM | POA: Diagnosis present

## 2022-09-01 DIAGNOSIS — E0590 Thyrotoxicosis, unspecified without thyrotoxic crisis or storm: Secondary | ICD-10-CM | POA: Diagnosis present

## 2022-09-01 DIAGNOSIS — J441 Chronic obstructive pulmonary disease with (acute) exacerbation: Principal | ICD-10-CM | POA: Diagnosis present

## 2022-09-01 DIAGNOSIS — Z888 Allergy status to other drugs, medicaments and biological substances status: Secondary | ICD-10-CM

## 2022-09-01 DIAGNOSIS — Z86718 Personal history of other venous thrombosis and embolism: Secondary | ICD-10-CM

## 2022-09-01 DIAGNOSIS — R7881 Bacteremia: Secondary | ICD-10-CM | POA: Diagnosis not present

## 2022-09-01 DIAGNOSIS — Z87891 Personal history of nicotine dependence: Secondary | ICD-10-CM

## 2022-09-01 DIAGNOSIS — E875 Hyperkalemia: Secondary | ICD-10-CM | POA: Diagnosis present

## 2022-09-01 DIAGNOSIS — R0689 Other abnormalities of breathing: Secondary | ICD-10-CM | POA: Diagnosis not present

## 2022-09-01 DIAGNOSIS — F419 Anxiety disorder, unspecified: Secondary | ICD-10-CM | POA: Diagnosis present

## 2022-09-01 DIAGNOSIS — E785 Hyperlipidemia, unspecified: Secondary | ICD-10-CM | POA: Diagnosis present

## 2022-09-01 DIAGNOSIS — J90 Pleural effusion, not elsewhere classified: Secondary | ICD-10-CM | POA: Diagnosis not present

## 2022-09-01 DIAGNOSIS — M1990 Unspecified osteoarthritis, unspecified site: Secondary | ICD-10-CM | POA: Diagnosis present

## 2022-09-01 DIAGNOSIS — Z7982 Long term (current) use of aspirin: Secondary | ICD-10-CM

## 2022-09-01 DIAGNOSIS — R0602 Shortness of breath: Secondary | ICD-10-CM | POA: Diagnosis not present

## 2022-09-01 DIAGNOSIS — J9811 Atelectasis: Secondary | ICD-10-CM | POA: Diagnosis present

## 2022-09-01 DIAGNOSIS — I5032 Chronic diastolic (congestive) heart failure: Secondary | ICD-10-CM | POA: Diagnosis present

## 2022-09-01 DIAGNOSIS — Z9981 Dependence on supplemental oxygen: Secondary | ICD-10-CM

## 2022-09-01 DIAGNOSIS — Z79890 Hormone replacement therapy (postmenopausal): Secondary | ICD-10-CM

## 2022-09-01 DIAGNOSIS — Z1152 Encounter for screening for COVID-19: Secondary | ICD-10-CM | POA: Diagnosis not present

## 2022-09-01 DIAGNOSIS — E089 Diabetes mellitus due to underlying condition without complications: Secondary | ICD-10-CM | POA: Diagnosis not present

## 2022-09-01 DIAGNOSIS — R000 Tachycardia, unspecified: Secondary | ICD-10-CM | POA: Diagnosis not present

## 2022-09-01 DIAGNOSIS — R4182 Altered mental status, unspecified: Secondary | ICD-10-CM | POA: Diagnosis not present

## 2022-09-01 DIAGNOSIS — J9621 Acute and chronic respiratory failure with hypoxia: Secondary | ICD-10-CM | POA: Diagnosis present

## 2022-09-01 DIAGNOSIS — J9612 Chronic respiratory failure with hypercapnia: Secondary | ICD-10-CM | POA: Diagnosis not present

## 2022-09-01 DIAGNOSIS — E669 Obesity, unspecified: Secondary | ICD-10-CM | POA: Diagnosis present

## 2022-09-01 DIAGNOSIS — I5033 Acute on chronic diastolic (congestive) heart failure: Secondary | ICD-10-CM | POA: Diagnosis not present

## 2022-09-01 DIAGNOSIS — R531 Weakness: Secondary | ICD-10-CM | POA: Diagnosis not present

## 2022-09-01 DIAGNOSIS — Z801 Family history of malignant neoplasm of trachea, bronchus and lung: Secondary | ICD-10-CM

## 2022-09-01 DIAGNOSIS — Z7951 Long term (current) use of inhaled steroids: Secondary | ICD-10-CM

## 2022-09-01 DIAGNOSIS — J9601 Acute respiratory failure with hypoxia: Secondary | ICD-10-CM | POA: Diagnosis not present

## 2022-09-01 DIAGNOSIS — E873 Alkalosis: Secondary | ICD-10-CM | POA: Diagnosis not present

## 2022-09-01 DIAGNOSIS — R062 Wheezing: Secondary | ICD-10-CM | POA: Diagnosis not present

## 2022-09-01 DIAGNOSIS — J329 Chronic sinusitis, unspecified: Secondary | ICD-10-CM | POA: Diagnosis present

## 2022-09-01 DIAGNOSIS — J9602 Acute respiratory failure with hypercapnia: Secondary | ICD-10-CM | POA: Diagnosis not present

## 2022-09-01 DIAGNOSIS — Z8249 Family history of ischemic heart disease and other diseases of the circulatory system: Secondary | ICD-10-CM

## 2022-09-01 DIAGNOSIS — E782 Mixed hyperlipidemia: Secondary | ICD-10-CM | POA: Diagnosis not present

## 2022-09-01 DIAGNOSIS — E119 Type 2 diabetes mellitus without complications: Secondary | ICD-10-CM | POA: Diagnosis present

## 2022-09-01 DIAGNOSIS — I10 Essential (primary) hypertension: Secondary | ICD-10-CM | POA: Diagnosis not present

## 2022-09-01 DIAGNOSIS — I110 Hypertensive heart disease with heart failure: Secondary | ICD-10-CM | POA: Diagnosis present

## 2022-09-01 DIAGNOSIS — J9622 Acute and chronic respiratory failure with hypercapnia: Secondary | ICD-10-CM | POA: Diagnosis not present

## 2022-09-01 DIAGNOSIS — R404 Transient alteration of awareness: Secondary | ICD-10-CM | POA: Diagnosis not present

## 2022-09-01 DIAGNOSIS — R54 Age-related physical debility: Secondary | ICD-10-CM | POA: Diagnosis present

## 2022-09-01 DIAGNOSIS — R918 Other nonspecific abnormal finding of lung field: Secondary | ICD-10-CM | POA: Diagnosis present

## 2022-09-01 DIAGNOSIS — R0902 Hypoxemia: Secondary | ICD-10-CM | POA: Diagnosis not present

## 2022-09-01 DIAGNOSIS — Z79899 Other long term (current) drug therapy: Secondary | ICD-10-CM

## 2022-09-01 DIAGNOSIS — G9341 Metabolic encephalopathy: Secondary | ICD-10-CM | POA: Insufficient documentation

## 2022-09-01 DIAGNOSIS — Z90722 Acquired absence of ovaries, bilateral: Secondary | ICD-10-CM

## 2022-09-01 DIAGNOSIS — Z833 Family history of diabetes mellitus: Secondary | ICD-10-CM

## 2022-09-01 DIAGNOSIS — Z8349 Family history of other endocrine, nutritional and metabolic diseases: Secondary | ICD-10-CM

## 2022-09-01 LAB — I-STAT VENOUS BLOOD GAS, ED
Acid-Base Excess: 10 mmol/L — ABNORMAL HIGH (ref 0.0–2.0)
Acid-Base Excess: 16 mmol/L — ABNORMAL HIGH (ref 0.0–2.0)
Bicarbonate: 42.7 mmol/L — ABNORMAL HIGH (ref 20.0–28.0)
Bicarbonate: 45.9 mmol/L — ABNORMAL HIGH (ref 20.0–28.0)
Calcium, Ion: 1.22 mmol/L (ref 1.15–1.40)
Calcium, Ion: 1.12 mmol/L — ABNORMAL LOW (ref 1.15–1.40)
HCT: 43 % (ref 36.0–46.0)
HCT: 43 % (ref 36.0–46.0)
Hemoglobin: 14.6 g/dL (ref 12.0–15.0)
Hemoglobin: 14.6 g/dL (ref 12.0–15.0)
O2 Saturation: 51 %
O2 Saturation: 58 %
Potassium: 5.2 mmol/L — ABNORMAL HIGH (ref 3.5–5.1)
Potassium: 4.3 mmol/L (ref 3.5–5.1)
Sodium: 140 mmol/L (ref 135–145)
Sodium: 139 mmol/L (ref 135–145)
TCO2: 46 mmol/L — ABNORMAL HIGH (ref 22–32)
TCO2: 48 mmol/L — ABNORMAL HIGH (ref 22–32)
pCO2, Ven: 102.1 mmHg (ref 44–60)
pCO2, Ven: 83.8 mmHg (ref 44–60)
pH, Ven: 7.229 — ABNORMAL LOW (ref 7.25–7.43)
pH, Ven: 7.347 (ref 7.25–7.43)
pO2, Ven: 35 mmHg (ref 32–45)
pO2, Ven: 34 mmHg (ref 32–45)

## 2022-09-01 LAB — TROPONIN I (HIGH SENSITIVITY)
Troponin I (High Sensitivity): 8 ng/L (ref <18)
Troponin I (High Sensitivity): 8 ng/L (ref <18)

## 2022-09-01 LAB — T4, FREE: Free T4: 1.2 ng/dL — ABNORMAL HIGH (ref 0.61–1.12)

## 2022-09-01 LAB — CBC WITH DIFFERENTIAL/PLATELET
Abs Immature Granulocytes: 0.05 10*3/uL (ref 0.00–0.07)
Basophils Absolute: 0 10*3/uL (ref 0.0–0.1)
Basophils Relative: 0 %
Eosinophils Absolute: 0 10*3/uL (ref 0.0–0.5)
Eosinophils Relative: 0 %
HCT: 46.1 % — ABNORMAL HIGH (ref 36.0–46.0)
Hemoglobin: 13.8 g/dL (ref 12.0–15.0)
Immature Granulocytes: 1 %
Lymphocytes Relative: 11 %
Lymphs Abs: 0.6 10*3/uL — ABNORMAL LOW (ref 0.7–4.0)
MCH: 31.7 pg (ref 26.0–34.0)
MCHC: 29.9 g/dL — ABNORMAL LOW (ref 30.0–36.0)
MCV: 105.7 fL — ABNORMAL HIGH (ref 80.0–100.0)
Monocytes Absolute: 0.4 10*3/uL (ref 0.1–1.0)
Monocytes Relative: 7 %
Neutro Abs: 4.4 10*3/uL (ref 1.7–7.7)
Neutrophils Relative %: 81 %
Platelets: 187 10*3/uL (ref 150–400)
RBC: 4.36 MIL/uL (ref 3.87–5.11)
RDW: 14.7 % (ref 11.5–15.5)
WBC: 5.5 10*3/uL (ref 4.0–10.5)
nRBC: 0 % (ref 0.0–0.2)

## 2022-09-01 LAB — I-STAT ARTERIAL BLOOD GAS, ED
Acid-Base Excess: 10 mmol/L — ABNORMAL HIGH (ref 0.0–2.0)
Acid-Base Excess: 18 mmol/L — ABNORMAL HIGH (ref 0.0–2.0)
Bicarbonate: 42 mmol/L — ABNORMAL HIGH (ref 20.0–28.0)
Bicarbonate: 45.3 mmol/L — ABNORMAL HIGH (ref 20.0–28.0)
Calcium, Ion: 1.15 mmol/L (ref 1.15–1.40)
Calcium, Ion: 1.29 mmol/L (ref 1.15–1.40)
HCT: 39 % (ref 36.0–46.0)
HCT: 41 % (ref 36.0–46.0)
Hemoglobin: 13.3 g/dL (ref 12.0–15.0)
Hemoglobin: 13.9 g/dL (ref 12.0–15.0)
O2 Saturation: 87 %
O2 Saturation: 98 %
Patient temperature: 97.8
Patient temperature: 98.2
Potassium: 3.9 mmol/L (ref 3.5–5.1)
Potassium: 4.7 mmol/L (ref 3.5–5.1)
Sodium: 139 mmol/L (ref 135–145)
Sodium: 139 mmol/L (ref 135–145)
TCO2: 45 mmol/L — ABNORMAL HIGH (ref 22–32)
TCO2: 47 mmol/L — ABNORMAL HIGH (ref 22–32)
pCO2 arterial: 101 mmHg (ref 32–48)
pCO2 arterial: 60.6 mmHg — ABNORMAL HIGH (ref 32–48)
pH, Arterial: 7.226 — ABNORMAL LOW (ref 7.35–7.45)
pH, Arterial: 7.48 — ABNORMAL HIGH (ref 7.35–7.45)
pO2, Arterial: 67 mmHg — ABNORMAL LOW (ref 83–108)
pO2, Arterial: 94 mmHg (ref 83–108)

## 2022-09-01 LAB — CBG MONITORING, ED
Glucose-Capillary: 115 mg/dL — ABNORMAL HIGH (ref 70–99)
Glucose-Capillary: 143 mg/dL — ABNORMAL HIGH (ref 70–99)
Glucose-Capillary: 146 mg/dL — ABNORMAL HIGH (ref 70–99)

## 2022-09-01 LAB — URINALYSIS, ROUTINE W REFLEX MICROSCOPIC
Bacteria, UA: NONE SEEN
Bilirubin Urine: NEGATIVE
Glucose, UA: NEGATIVE mg/dL
Hgb urine dipstick: NEGATIVE
Ketones, ur: 5 mg/dL — AB
Leukocytes,Ua: NEGATIVE
Nitrite: NEGATIVE
Protein, ur: 100 mg/dL — AB
Specific Gravity, Urine: 1.02 (ref 1.005–1.030)
pH: 5 (ref 5.0–8.0)

## 2022-09-01 LAB — COMPREHENSIVE METABOLIC PANEL
ALT: 15 U/L (ref 0–44)
AST: 28 U/L (ref 15–41)
Albumin: 3.9 g/dL (ref 3.5–5.0)
Alkaline Phosphatase: 50 U/L (ref 38–126)
Anion gap: 8 (ref 5–15)
BUN: 8 mg/dL (ref 8–23)
CO2: 35 mmol/L — ABNORMAL HIGH (ref 22–32)
Calcium: 9.3 mg/dL (ref 8.9–10.3)
Chloride: 98 mmol/L (ref 98–111)
Creatinine, Ser: 0.71 mg/dL (ref 0.44–1.00)
GFR, Estimated: >60 mL/min (ref >60)
Glucose, Bld: 120 mg/dL — ABNORMAL HIGH (ref 70–99)
Potassium: 5.4 mmol/L — ABNORMAL HIGH (ref 3.5–5.1)
Sodium: 141 mmol/L (ref 135–145)
Total Bilirubin: 0.4 mg/dL (ref 0.3–1.2)
Total Protein: 7 g/dL (ref 6.5–8.1)

## 2022-09-01 LAB — RESP PANEL BY RT-PCR (FLU A&B, COVID) ARPGX2
Influenza A by PCR: NEGATIVE
Influenza B by PCR: NEGATIVE
SARS Coronavirus 2 by RT PCR: NEGATIVE

## 2022-09-01 LAB — TSH: TSH: 0.316 u[IU]/mL — ABNORMAL LOW (ref 0.350–4.500)

## 2022-09-01 LAB — BRAIN NATRIURETIC PEPTIDE: B Natriuretic Peptide: 16.7 pg/mL (ref 0.0–100.0)

## 2022-09-01 LAB — LIPASE, BLOOD: Lipase: 30 U/L (ref 11–51)

## 2022-09-01 MED ORDER — METHYLPREDNISOLONE SODIUM SUCC 40 MG IJ SOLR
40.0000 mg | Freq: Two times a day (BID) | INTRAMUSCULAR | Status: DC
  Administered 2022-09-02: 40 mg via INTRAVENOUS
  Filled 2022-09-01: qty 1

## 2022-09-01 MED ORDER — SODIUM CHLORIDE 0.9 % IV SOLN
1.0000 g | INTRAVENOUS | Status: DC
  Administered 2022-09-01: 1 g via INTRAVENOUS
  Filled 2022-09-01: qty 10

## 2022-09-01 MED ORDER — IPRATROPIUM-ALBUTEROL 0.5-2.5 (3) MG/3ML IN SOLN
3.0000 mL | Freq: Four times a day (QID) | RESPIRATORY_TRACT | Status: DC
  Administered 2022-09-01 – 2022-09-03 (×5): 3 mL via RESPIRATORY_TRACT
  Filled 2022-09-01 (×5): qty 3

## 2022-09-01 MED ORDER — ASPIRIN 81 MG PO TBEC
81.0000 mg | DELAYED_RELEASE_TABLET | Freq: Every day | ORAL | Status: DC
  Administered 2022-09-02 – 2022-09-04 (×3): 81 mg via ORAL
  Filled 2022-09-01 (×3): qty 1

## 2022-09-01 MED ORDER — LORATADINE 10 MG PO TABS: 10.0000 mg | ORAL_TABLET | Freq: Every day | ORAL | Status: DC | PRN

## 2022-09-01 MED ORDER — METHYLPREDNISOLONE SODIUM SUCC 125 MG IJ SOLR
125.0000 mg | Freq: Once | INTRAMUSCULAR | Status: AC
  Administered 2022-09-01: 125 mg via INTRAVENOUS
  Filled 2022-09-01: qty 2

## 2022-09-01 MED ORDER — ACETAMINOPHEN 650 MG RE SUPP: 650.0000 mg | Freq: Four times a day (QID) | RECTAL | Status: DC | PRN

## 2022-09-01 MED ORDER — HYDROCODONE-ACETAMINOPHEN 5-325 MG PO TABS: 1.0000 | ORAL_TABLET | ORAL | Status: DC | PRN

## 2022-09-01 MED ORDER — SODIUM CHLORIDE 0.9% FLUSH: 3.0000 mL | INTRAVENOUS | Status: DC | PRN

## 2022-09-01 MED ORDER — ACETAMINOPHEN 325 MG PO TABS: 650.0000 mg | ORAL_TABLET | Freq: Four times a day (QID) | ORAL | Status: DC | PRN

## 2022-09-01 MED ORDER — IPRATROPIUM-ALBUTEROL 0.5-2.5 (3) MG/3ML IN SOLN
3.0000 mL | RESPIRATORY_TRACT | Status: AC
  Administered 2022-09-01 (×2): 3 mL via RESPIRATORY_TRACT
  Filled 2022-09-01: qty 6

## 2022-09-01 MED ORDER — FUROSEMIDE 10 MG/ML IJ SOLN
40.0000 mg | Freq: Once | INTRAMUSCULAR | Status: AC
  Administered 2022-09-01: 40 mg via INTRAVENOUS
  Filled 2022-09-01: qty 4

## 2022-09-01 MED ORDER — SODIUM CHLORIDE 0.9 % IV SOLN: 250.0000 mL | INTRAVENOUS | Status: DC | PRN

## 2022-09-01 MED ORDER — INSULIN ASPART 100 UNIT/ML IJ SOLN
0.0000 [IU] | INTRAMUSCULAR | Status: DC
  Administered 2022-09-01 (×2): 1 [IU] via SUBCUTANEOUS
  Administered 2022-09-02: 2 [IU] via SUBCUTANEOUS
  Administered 2022-09-02: 1 [IU] via SUBCUTANEOUS
  Administered 2022-09-02: 2 [IU] via SUBCUTANEOUS
  Administered 2022-09-03: 3 [IU] via SUBCUTANEOUS
  Administered 2022-09-04: 2 [IU] via SUBCUTANEOUS
  Administered 2022-09-04 (×2): 1 [IU] via SUBCUTANEOUS

## 2022-09-01 MED ORDER — PREDNISONE 20 MG PO TABS: 40.0000 mg | ORAL_TABLET | Freq: Every day | ORAL | Status: DC

## 2022-09-01 MED ORDER — SODIUM CHLORIDE 0.9% FLUSH
3.0000 mL | Freq: Two times a day (BID) | INTRAVENOUS | Status: DC
  Administered 2022-09-01 – 2022-09-04 (×6): 3 mL via INTRAVENOUS

## 2022-09-01 MED ORDER — IPRATROPIUM-ALBUTEROL 0.5-2.5 (3) MG/3ML IN SOLN
3.0000 mL | Freq: Once | RESPIRATORY_TRACT | Status: AC
  Administered 2022-09-01: 3 mL via RESPIRATORY_TRACT
  Filled 2022-09-01: qty 3

## 2022-09-01 MED ORDER — ATORVASTATIN CALCIUM 10 MG PO TABS
10.0000 mg | ORAL_TABLET | Freq: Every day | ORAL | Status: DC
  Administered 2022-09-02 – 2022-09-04 (×3): 10 mg via ORAL
  Filled 2022-09-01 (×3): qty 1

## 2022-09-01 NOTE — Assessment & Plan Note (Signed)
Allow permissive HTN 

## 2022-09-01 NOTE — ED Notes (Signed)
Edp at bedside requesting for respiratory to increase the Respiratory rate on bipap. Respiratory called

## 2022-09-01 NOTE — ED Triage Notes (Signed)
Pt bib [facility], normal last night. woke up at 9am lethargic and altered. 4L O2 baseline. Frothy sputum  Wheezing from left upper lung 43-62 cap 130/60

## 2022-09-01 NOTE — Assessment & Plan Note (Signed)
Cont lipitor 10 mg po q day

## 2022-09-01 NOTE — ED Provider Notes (Signed)
  Physical Exam  BP 127/63   Pulse 86   Temp 98.2 F (36.8 C) (Oral)   Resp 14   Ht 5' (1.524 m)   Wt 54.9 kg   SpO2 99%   BMI 23.63 kg/m   Physical Exam Vitals and nursing note reviewed.  Constitutional:      General: She is not in acute distress.    Appearance: She is well-developed.  HENT:     Head: Normocephalic and atraumatic.     Mouth/Throat:     Mouth: Mucous membranes are moist.  Eyes:     Pupils: Pupils are equal, round, and reactive to light.  Cardiovascular:     Rate and Rhythm: Normal rate and regular rhythm.     Heart sounds: No murmur heard. Pulmonary:     Breath sounds: Wheezing present.     Comments: On bipap  Abdominal:     General: Abdomen is flat.     Palpations: Abdomen is soft.     Tenderness: There is no abdominal tenderness.  Musculoskeletal:        General: No tenderness.     Right lower leg: No edema.     Left lower leg: No edema.  Skin:    General: Skin is warm and dry.  Neurological:     Mental Status: She is alert.     Comments: Oriented to self and place, not to year  Psychiatric:        Mood and Affect: Mood normal.        Behavior: Behavior normal.     Procedures  .Critical Care  Performed by: Cristie Hem, MD Authorized by: Cristie Hem, MD   Critical care provider statement:    Critical care time (minutes):  30   Critical care was necessary to treat or prevent imminent or life-threatening deterioration of the following conditions:  Respiratory failure   Critical care was time spent personally by me on the following activities:  Development of treatment plan with patient or surrogate, discussions with consultants, evaluation of patient's response to treatment, examination of patient, ordering and review of laboratory studies, ordering and review of radiographic studies, ordering and performing treatments and interventions, pulse oximetry, re-evaluation of patient's condition and review of old charts   I assumed  direction of critical care for this patient from another provider in my specialty: yes     Care discussed with: admitting provider     ED Course / MDM   Clinical Course as of 09/01/22 1915  Mon Sep 01, 2022  1636 Received sign out pending repeat VBG. Patient has been on BiPAP and repeat CO2 unchanged, but rate was low so will increase rate and re-assess.  [WS]  5732 Discussed with family, they are not sure if patient would want to be intubated or not.  Regardless patient's mental status has significantly improved and her repeat VBG is also improved.  No indication for intubation at this time.  I discussed with the hospitalist will admit the patient to medicine service. [WS]    Clinical Course User Index [WS] Cristie Hem, MD   Medical Decision Making Amount and/or Complexity of Data Reviewed Independent Historian: guardian External Data Reviewed: labs.    Details: Previous pCO2 Labs: ordered. Radiology: ordered. ECG/medicine tests: ordered.  Risk Prescription drug management. Decision regarding hospitalization.         Cristie Hem, MD 09/01/22 [phone]

## 2022-09-01 NOTE — Assessment & Plan Note (Signed)
this patient has acute respiratory failure with Hypoxia and  Hypercarbia as documented by the presence of following: O2 saturatio< 90% on RA  pH <7.35 with pCO2 >50   Likely due to:   COPD exacerbation,   Provide O2 therapy and titrate as needed  Continuous pulse ox   check Pulse ox with ambulation prior to discharge    flutter valve ordered

## 2022-09-01 NOTE — Assessment & Plan Note (Signed)
Order sliding scale, diet controlled

## 2022-09-01 NOTE — ED Notes (Signed)
Dr. Curly Rim to discuss possibly coming off of Bipap. Pt is more alert and able to talk more clearly.

## 2022-09-01 NOTE — Progress Notes (Signed)
Patient transported on BIPAP from ED Room 2 to CT and back with no complications noted.

## 2022-09-01 NOTE — Telephone Encounter (Signed)
LMTCB

## 2022-09-01 NOTE — Assessment & Plan Note (Signed)
Due to hypercapnea now improving will avoid sedatives Continue biPAP

## 2022-09-01 NOTE — Assessment & Plan Note (Signed)
-  -   Will initiate: Steroid taper  -  Antibiotics rocephin - Albuterol  RN, - scheduled duoneb,  -  Breo or Dulera at discharge   -  Mucinex.  Titrate O2 to saturation >90%. Follow patients respiratory status.  VBG improving after being on BIPAP   BiPAP ordered PRN for increased work of breathing.  Currently mentating well no evidence of symptomatic hypercarbia

## 2022-09-01 NOTE — ED Provider Notes (Signed)
[facility] DEPART[facility] Provider Note   CSN: [id number] Arrival date & time: 09/01/22  1414    History  Chief Complaint  Patient presents with   Altered Mental Status    Jasmine [name] is a 76 y.o. female with COPD, hypothyroidism, CHF, chronic hypoxic respiratory failure, diabetes, hypertension, hyperlipidemia here for evaluation of lethargy and increased shortness of breath.  Symptoms began yesterday prior to going to bed.  Noted this morning he has had increased generalized weakness and increased work of breathing.  She wears 4 L nasal cannula at baseline.  On EMS arrival patient with generalized weakness.  Hypoxic on home oxygen subsequently requiring 6 L via nasal cannula.  She appears to have obvious fluid overload to lower extremities as well as JVD.  Patient states she feels short of breath.  She denies any chest pain.  States she feels weak.  Lives with Husband  HPI     Home Medications Prior to Admission medications   Medication Sig Start Date End Date Taking? Authorizing Provider  acetaminophen (TYLENOL) 325 MG tablet Take 2 tablets (650 mg total) by mouth every 6 (six) hours as needed for mild pain (or Fever >/= 101). 05/01/22   Georgette Shell, MD  albuterol (VENTOLIN HFA) 108 (90 Base) MCG/ACT inhaler INHALE 1 TO 2 PUFFS INTO THE LUNGS EVERY 6 HOURS AS NEEDED FOR WHEEZING OR SHORTNESS OF BREATH 08/19/22   Biagio Borg, MD  aspirin EC 81 MG EC tablet Take 1 tablet (81 mg total) by mouth daily. 03/27/16   Velvet Bathe, MD  atorvastatin (LIPITOR) 10 MG tablet TAKE 1 TABLET (10 MG TOTAL) BY MOUTH DAILY. 08/19/22   Biagio Borg, MD  azithromycin (ZITHROMAX) 250 MG tablet Take 1 tablet (250 mg total) by mouth daily. Patient not taking: Reported on 08/27/2022 07/23/22   Biagio Borg, MD  citalopram (CELEXA) 20 MG tablet TAKE ONE TABLET BY MOUTH DAILY 07/23/22   Biagio Borg, MD  docusate sodium (COLACE) 100 MG capsule Take 1 capsule (100 mg  total) by mouth 2 (two) times daily. 07/31/22   Biagio Borg, MD  fluticasone (FLONASE) 50 MCG/ACT nasal spray Place 1 spray into both nostrils daily. 05/02/22   Georgette Shell, MD  Fluticasone-Umeclidin-Vilant (TRELEGY ELLIPTA) 200-62.5-25 MCG/ACT AEPB INHALE 1 PUFF INTO THE LUNGS DAILY. 05/21/22   Biagio Borg, MD  furosemide (LASIX) 20 MG tablet Take 1 tablet (20 mg total) by mouth daily. 05/01/22 05/01/23  Georgette Shell, MD  ipratropium-albuterol (DUONEB) 0.5-2.5 (3) MG/3ML SOLN Take 3 mLs by nebulization every 6 (six) hours as needed. Patient taking differently: Take 3 mLs by nebulization every 6 (six) hours as needed (for shortness of breath). 11/05/21   Rigoberto Noel, MD  levothyroxine (SYNTHROID) 50 MCG tablet Take 1 tablet (50 mcg total) by mouth daily. 08/29/22   Shamleffer, Melanie Crazier, MD  loratadine (CLARITIN) 10 MG tablet Take 1 tablet (10 mg total) by mouth daily. Patient taking differently: Take 10 mg by mouth daily as needed for allergies. 05/02/22   Georgette Shell, MD  montelukast (SINGULAIR) 10 MG tablet Take 1 tablet (10 mg total) by mouth at bedtime. 07/21/22   Danford, Suann Larry, MD  OXYGEN Inhale 3 L into the lungs continuous. 24/7    [provider]  potassium chloride (KLOR-CON M) 10 MEQ tablet Take 1 tablet (10 mEq total) by mouth daily. 06/30/22   Biagio Borg, MD  Allergies    Alendronate sodium    Review of Systems   Review of Systems  Unable to perform ROS: Mental status change  Constitutional: Negative.   Respiratory:  Positive for cough and shortness of breath. Negative for apnea, choking, chest tightness, wheezing and stridor.   Cardiovascular:  Positive for leg swelling.  Gastrointestinal: Negative.   Genitourinary: Negative.   Musculoskeletal: Negative.   Skin: Negative.   Neurological:  Positive for weakness.  All other systems reviewed and are negative.   Physical Exam Updated Vital Signs BP 134/66   Pulse 75    Temp 98.2 F (36.8 C) (Oral)   Resp 14   Ht 5' (1.524 m)   Wt 54.9 kg   SpO2 100%   BMI 23.63 kg/m  Physical Exam Vitals and nursing note reviewed.  Constitutional:      General: She is in acute distress.     Appearance: She is well-developed. She is obese. She is ill-appearing. She is not toxic-appearing.  HENT:     Head: Normocephalic and atraumatic.     Nose: Nose normal.     Mouth/Throat:     Mouth: Mucous membranes are dry.  Eyes:     Pupils: Pupils are equal, round, and reactive to light.  Cardiovascular:     Rate and Rhythm: Normal rate.     Pulses:          Radial pulses are 2+ on the right side and 2+ on the left side.       Dorsalis pedis pulses are 1+ on the right side and 1+ on the left side.     Heart sounds: Normal heart sounds.     Comments: JVD present Pulmonary:     Effort: Tachypnea present. No respiratory distress.     Breath sounds: Wheezing and rales present.     Comments: 6 L via nasal cannula.  Expiratory wheeze, Rales bilaterally, increased work of breathing present Chest:     Comments: Nontender chest wall Abdominal:     General: Bowel sounds are normal. There is no distension.     Palpations: Abdomen is soft. There is no mass.     Tenderness: There is no abdominal tenderness. There is no right CVA tenderness or guarding.     Hernia: No hernia is present.     Comments: Soft, nontender  Musculoskeletal:        General: Normal range of motion.     Cervical back: Normal range of motion.     Right lower leg: 3+ Edema present.     Left lower leg: 3+ Edema present.     Comments: Moves all 4 extremities equally, no bony tenderness, 3+ pitting edema to knees bilaterally  Skin:    General: Skin is warm and dry.     Capillary Refill: Capillary refill takes 2 to 3 seconds.     Comments: Warm, Perfused  Neurological:     General: No focal deficit present.     Mental Status: She is lethargic.     Comments: Somnolent however arousable to voice Aware of  person, [facility], year No obvious facial droop, tongue midline, global weakness throughout however no unilateral weakness  Psychiatric:        Mood and Affect: Mood normal.    ED Results / Procedures / Treatments   Labs (all labs ordered are listed, but only abnormal results are displayed) Labs Reviewed  CBC WITH DIFFERENTIAL/PLATELET - Abnormal; Notable for the following components:  Result Value   HCT 46.1 (*)    MCV 105.7 (*)    MCHC 29.9 (*)    Lymphs Abs 0.6 (*)    All other components within normal limits  COMPREHENSIVE METABOLIC PANEL - Abnormal; Notable for the following components:   Potassium 5.4 (*)    CO2 35 (*)    Glucose, Bld 120 (*)    All other components within normal limits  TSH - Abnormal; Notable for the following components:   TSH 0.316 (*)    All other components within normal limits  T4, FREE - Abnormal; Notable for the following components:   Free T4 1.20 (*)    All other components within normal limits  URINALYSIS, ROUTINE W REFLEX MICROSCOPIC - Abnormal; Notable for the following components:   Ketones, ur 5 (*)    Protein, ur 100 (*)    All other components within normal limits  I-STAT ARTERIAL BLOOD GAS, ED - Abnormal; Notable for the following components:   pH, Arterial 7.226 (*)    pCO2 arterial 101.0 (*)    pO2, Arterial 67 (*)    Bicarbonate 42.0 (*)    TCO2 45 (*)    Acid-Base Excess 10.0 (*)    All other components within normal limits  CBG MONITORING, ED - Abnormal; Notable for the following components:   Glucose-Capillary 115 (*)    All other components within normal limits  I-STAT VENOUS BLOOD GAS, ED - Abnormal; Notable for the following components:   pH, Ven 7.229 (*)    pCO2, Ven 102.1 (*)    Bicarbonate 42.7 (*)    TCO2 46 (*)    Acid-Base Excess 10.0 (*)    Potassium 5.2 (*)    All other components within normal limits  RESP PANEL BY RT-PCR (FLU A&B, COVID) ARPGX2  LIPASE, BLOOD  BRAIN NATRIURETIC PEPTIDE   AMMONIA  TROPONIN I (HIGH SENSITIVITY)  TROPONIN I (HIGH SENSITIVITY)    EKG EKG Interpretation  Date/Time:  Monday September 01 2022 14:59:36 EST Ventricular Rate:  73 PR Interval:  161 QRS Duration: 84 QT Interval:  380 QTC Calculation: 419 R Axis:   86 Text Interpretation: Sinus rhythm Right atrial enlargement Anteroseptal infarct, age indeterminate Confirmed by Regan Lemming ([id number]) on 09/01/2022 3:31:20 PM  Radiology CT HEAD WO CONTRAST (5MM)  Result Date: 09/01/2022 CLINICAL DATA:  Altered mental status. EXAM: CT HEAD WITHOUT CONTRAST TECHNIQUE: Contiguous axial images were obtained from the base of the skull through the vertex without intravenous contrast. RADIATION DOSE REDUCTION: This exam was performed according to the departmental dose-optimization program which includes automated exposure control, adjustment of the mA and/or kV according to patient size and/or use of iterative reconstruction technique. COMPARISON:  August 14, 2019. FINDINGS: Brain: No evidence of acute infarction, hemorrhage, hydrocephalus, extra-axial collection or mass lesion/mass effect. Vascular: No hyperdense vessel or unexpected calcification. Skull: Normal. Negative for fracture or focal lesion. Sinuses/Orbits: No acute finding. Other: None. IMPRESSION: No acute intracranial abnormality seen. Electronically Signed   By: Marijo Conception M.D.   On: 09/01/2022 15:23   DG Chest Portable 1 View  Result Date: 09/01/2022 CLINICAL DATA:  Frothy sputum and wheezing EXAM: PORTABLE CHEST 1 VIEW COMPARISON:  07/09/2022 FINDINGS: Subtle airspace density in the RIGHT upper lobe is improved from comparison exam. Small LEFT effusion is similar. No focal consolidation. No pneumothorax. Atherosclerotic calcification of the aorta. IMPRESSION: 1. Persistent nodular airspace disease in the RIGHT upper lobe. 2. Persistent small LEFT effusion. 3. No progressive finding  from comparison exam 07/18/2022. Electronically Signed    By: Suzy Bouchard M.D.   On: 09/01/2022 14:59    Procedures Procedures    Medications Ordered in ED Medications  ipratropium-albuterol (DUONEB) 0.5-2.5 (3) MG/3ML nebulizer solution 3 mL (3 mLs Nebulization Given 09/01/22 1457)  furosemide (LASIX) injection 40 mg (40 mg Intravenous Given 09/01/22 1543)  methylPREDNISolone sodium succinate (SOLU-MEDROL) 125 mg/2 mL injection 125 mg (125 mg Intravenous Given 09/01/22 1544)  ipratropium-albuterol (DUONEB) 0.5-2.5 (3) MG/3ML nebulizer solution 3 mL (3 mLs Nebulization Given 09/01/22 1609)    ED Course/ Medical Decision Making/ A&P Clinical Course as of 09/01/22 1712  Mon Sep 01, 2022  1636 Received sign out pending repeat VBG. Patient has been on BiPAP and repeat CO2 unchanged, but rate was low so will increase rate and re-assess.  [WS]    Clinical Course User Index [WS] Cristie Hem, MD    76 year old with multiple medical comorbidities and chronic hypoxic respiratory failure due to COPD, CHF, diabetes, hypertension, hyperlipidemia here for evaluation of lethargy and fluid overload.  Symptoms began yesterday, worsening weakness and shortness of breath this morning.  EMS noted frothy sputum.  She is wheezing and rales bilaterally.  Appears clinically fluid overloaded with 3+ pitting edema to lower extremities as well as JVD.  Her extremities are warm and perfused.  There is globally weak however no focal deficits.  She is alert to person, [facility] as well as year however does appear sleepy.  Admits to coughing up white frothy sputum.  No fever at home, low suspicion for sepsis, bacterial infectious process.  Some increased oxygen requirement from baseline. Will treat with COPD as well as CHF treatments, obtain labs and imaging.  Labs and imaging personally viewed and interpreted:  ABG PCO2 101>>> Placed on BiPAP  Patient reassessed.  PCO2, suspect lethargy from retaining.  Patient placed on BiPAP.    Care transferred  to attending Dr. Armandina Gemma who will FU on remaining labs, imaging. Suspect likely need to admission                           Medical Decision Making Amount and/or Complexity of Data Reviewed Independent Historian: EMS External Data Reviewed: labs, radiology, ECG and notes. Labs: ordered. Decision-making details documented in ED Course. Radiology: ordered and independent interpretation performed. Decision-making details documented in ED Course. ECG/medicine tests: ordered and independent interpretation performed. Decision-making details documented in ED Course.  Risk OTC drugs. Prescription drug management. Parenteral controlled substances. Decision regarding hospitalization. Diagnosis or treatment significantly limited by social determinants of health.          Final Clinical Impression(s) / ED Diagnoses Final diagnoses:  None    Rx / DC Orders ED Discharge Orders     None         Conya Ellinwood A, PA-C 09/01/22 1712    Regan Lemming, MD 09/01/22 1843    Regan Lemming, MD 09/01/22 [phone]

## 2022-09-01 NOTE — Assessment & Plan Note (Signed)
Hold synthroid for now, will need to follow

## 2022-09-01 NOTE — Assessment & Plan Note (Signed)
Was supposed to have repeat CT chest done as out pt  Can attempt to obtain once more stable

## 2022-09-01 NOTE — Assessment & Plan Note (Signed)
Mild have improved after lasix administration will repeat BMET and follow

## 2022-09-01 NOTE — Assessment & Plan Note (Signed)
Given 1 dose of LAsix in ER with some imporvemtn will conitnue to gently diurese repeat echo

## 2022-09-01 NOTE — Progress Notes (Signed)
Pt. Requested to come off the bipap. Pt. ABG much improved. Pt. Placed on nasal cannula for now. Pt. Understands she will have to go back on the bipap if she gets distressed.

## 2022-09-01 NOTE — Subjective & Objective (Signed)
This Am pt has been more lethargic, on 4L of O2 at baselinew ith hx of COPD Has been wheezing EMS was called, pt unable to provide her own hx On arrival needing BIPAP, VBG pCO2 >100 Was given a dose of LAsix VBG improved now well compensated but pCO2 still up to 80 Mental state has improved

## 2022-09-01 NOTE — H&P (Signed)
Jasmine [name]:[id number] DOB: [birth year] DOA: 09/01/2022   PCP: Biagio Borg, MD   Outpatient Specialists:   CARDS:     Pulmonary  Dr. Polly Cobia, Dr. Elsworth Soho   GI Dr.  (  LB)Mansouraty, Telford Nab., MD     Patient arrived to ER on 09/01/22 at 1414 Referred by Attending Toy Baker, MD   Patient coming from:    home Lives  With family    Chief Complaint:   Chief Complaint  Patient presents with   Altered Mental Status    HPI: Jasmine [name] is a 76 y.o. female with medical history significant of COPD on 4L o2, DM2, Diastolic CHF, chronic hypoxic and hypercarbic respiratory failure on oxygen, remote DVT in 1960's, HTN, hypothyroidism, bilateral pulmonary nodules   Presented with  SOB, confusion  This Am pt has been more lethargic, on 4L of O2 at baselinew ith hx of COPD Has been wheezing EMS was called, pt unable to provide her own hx On arrival needing BIPAP, VBG pCO2 >100 Was given a dose of LAsix VBG improved now well compensated but pCO2 still up to 80 Mental state has improved  Reports has been taking her medications as prescribed  No longer smokes, drinks occasionally No fever no CP now but does get it occasionally Denies dysuria   Initial COVID TEST  NEGATIVE   Lab Results  Component Value Date   [place] 09/01/2022   [place] NEGATIVE 07/18/2022   [place] NEGATIVE 04/28/2022   [place] NEGATIVE 07/30/2021     Regarding pertinent Chronic problems:    Hyperlipidemia - on statins Lipitor (atorvastatin)  Lipid Panel     Component Value Date/Time   CHOL 188 02/25/2022 1549   TRIG 44.0 02/25/2022 1549   HDL 102.60 02/25/2022 1549   CHOLHDL 2 02/25/2022 1549   VLDL 8.8 02/25/2022 1549   LDLCALC 76 02/25/2022 1549   LDLCALC 89 06/08/2020 1555   LDLDIRECT 102.1 10/10/2010 1045      chronic CHF diastolic/systolic/ combined - last echo July 2023 Left ventricular ejection fraction, by estimation, is 70 to 75%. The   left ventricle has hyperdynamic function. The left ventricle has no  regional wall motion abnormalities. Left ventricular diastolic parameters  are consistent with Grade I diastolic  dysfunction (impaired relaxation).  On lasix   OSA on CPAP machine    DM 2 -  Lab Results  Component Value Date   HGBA1C 5.9 (A) 08/27/2022   diet controlled   Hypothyroidism:  Lab Results  Component Value Date   TSH 0.316 (L) 09/01/2022   on synthroid    COPD -  followed by pulmonology  on baseline oxygen  4L,   On Trelegy, on DuoNeb    While in ER: Clinical Course as of 09/01/22 1913  Mon Sep 01, 2022  1636 Received sign out pending repeat VBG. Patient has been on BiPAP and repeat CO2 unchanged, but rate was low so will increase rate and re-assess.  [WS]  8315 Discussed with family, they are not sure if patient would want to be intubated or not.  Regardless patient's mental status has significantly improved and her repeat VBG is also improved.  No indication for intubation at this time.  I discussed with the hospitalist will admit the patient to medicine service. [WS]    Clinical Course User Index [WS] Cristie Hem, MD     Ordered  CT HEAD   NON acute  CXR - Persistent nodular airspace  disease in the RIGHT upper lobe.     Following Medications were ordered in ER: Medications  ipratropium-albuterol (DUONEB) 0.5-2.5 (3) MG/3ML nebulizer solution 3 mL (3 mLs Nebulization Given 09/01/22 1457)  furosemide (LASIX) injection 40 mg (40 mg Intravenous Given 09/01/22 1543)  methylPREDNISolone sodium succinate (SOLU-MEDROL) 125 mg/2 mL injection 125 mg (125 mg Intravenous Given 09/01/22 1544)  ipratropium-albuterol (DUONEB) 0.5-2.5 (3) MG/3ML nebulizer solution 3 mL (3 mLs Nebulization Given 09/01/22 1609)       ED Triage Vitals  Enc Vitals Group     BP 09/01/22 1434 134/60     Pulse Rate 09/01/22 1434 89     Resp 09/01/22 1457 20     Temp 09/01/22 1434 98.3 F (36.8 C)     Temp  Source 09/01/22 1434 Oral     SpO2 09/01/22 1433 91 %     Weight 09/01/22 1435 121 lb (54.9 kg)     Height 09/01/22 1435 5' (1.524 m)     Head Circumference --      Peak Flow --      Pain Score --      Pain Loc --      Pain Edu? --      Excl. in [place]? --   TMAX(24)@     _________________________________________ Significant initial  Findings: Abnormal Labs Reviewed  CBC WITH DIFFERENTIAL/PLATELET - Abnormal; Notable for the following components:      Result Value   HCT 46.1 (*)    MCV 105.7 (*)    MCHC 29.9 (*)    Lymphs Abs 0.6 (*)    All other components within normal limits  COMPREHENSIVE METABOLIC PANEL - Abnormal; Notable for the following components:   Potassium 5.4 (*)    CO2 35 (*)    Glucose, Bld 120 (*)    All other components within normal limits  TSH - Abnormal; Notable for the following components:   TSH 0.316 (*)    All other components within normal limits  T4, FREE - Abnormal; Notable for the following components:   Free T4 1.20 (*)    All other components within normal limits  URINALYSIS, ROUTINE W REFLEX MICROSCOPIC - Abnormal; Notable for the following components:   Ketones, ur 5 (*)    Protein, ur 100 (*)    All other components within normal limits  I-STAT ARTERIAL BLOOD GAS, ED - Abnormal; Notable for the following components:   pH, Arterial 7.226 (*)    pCO2 arterial 101.0 (*)    pO2, Arterial 67 (*)    Bicarbonate 42.0 (*)    TCO2 45 (*)    Acid-Base Excess 10.0 (*)    All other components within normal limits  CBG MONITORING, ED - Abnormal; Notable for the following components:   Glucose-Capillary 115 (*)    All other components within normal limits  I-STAT VENOUS BLOOD GAS, ED - Abnormal; Notable for the following components:   pH, Ven 7.229 (*)    pCO2, Ven 102.1 (*)    Bicarbonate 42.7 (*)    TCO2 46 (*)    Acid-Base Excess 10.0 (*)    Potassium 5.2 (*)    All other components within normal limits  I-STAT VENOUS BLOOD GAS, ED - Abnormal;  Notable for the following components:   pCO2, Ven 83.8 (*)    Bicarbonate 45.9 (*)    TCO2 48 (*)    Acid-Base Excess 16.0 (*)    Calcium, Ion 1.12 (*)    All other components  within normal limits    _________________________ Troponin 8 ECG: Ordered Personally reviewed and interpreted by me showing: HR : 73 Rhythm:Sinus rhythm Right atrial enlargement Anteroseptal infarct, age indeterminate QTC 7   The recent clinical data is shown below. Vitals:   09/01/22 1815 09/01/22 1828 09/01/22 1830 09/01/22 1911  BP: 120/62  127/63   Pulse:   80 86  Resp: '19 17 20 14  '$ Temp:      TempSrc:      SpO2:   100% 99%  Weight:      Height:         WBC     Component Value Date/Time   WBC 5.5 09/01/2022 1424   LYMPHSABS 0.6 (L) 09/01/2022 1424   LYMPHSABS 1.7 08/06/2016 1404   MONOABS 0.4 09/01/2022 1424   MONOABS 0.5 08/06/2016 1404   EOSABS 0.0 09/01/2022 1424   EOSABS 0.4 08/06/2016 1404   BASOSABS 0.0 09/01/2022 1424   BASOSABS 0.0 08/06/2016 1404      UA   no evidence of UTI    Urine analysis:    Component Value Date/Time   COLORURINE YELLOW 09/01/2022 [place] 09/01/2022 1608   LABSPEC 1.020 09/01/2022 1608   PHURINE 5.0 09/01/2022 1608   GLUCOSEU NEGATIVE 09/01/2022 1608   GLUCOSEU NEGATIVE 02/25/2022 1549   HGBUR NEGATIVE 09/01/2022 1608   BILIRUBINUR NEGATIVE 09/01/2022 1608   BILIRUBINUR negative 08/11/2011 1358   KETONESUR 5 (A) 09/01/2022 1608   PROTEINUR 100 (A) 09/01/2022 1608   UROBILINOGEN 0.2 02/25/2022 1549   NITRITE NEGATIVE 09/01/2022 1608   LEUKOCYTESUR NEGATIVE 09/01/2022 1608    Results for orders placed or performed during the hospital encounter of 09/01/22  Resp Panel by RT-PCR (Flu A&B, Covid) Anterior Nasal Swab     Status: None   Collection Time: 09/01/22  2:29 PM   Specimen: Anterior Nasal Swab  Result Value Ref Range Status   SARS Coronavirus 2 by RT PCR NEGATIVE NEGATIVE Final         Influenza A by PCR NEGATIVE  NEGATIVE Final   Influenza B by PCR NEGATIVE NEGATIVE Final         *Note: Due to a large number of results and/or encounters for the requested time period, some results have not been displayed. A complete set of results can be found in Results Review.   _________________________ Hospitalist was called for admission for COPD exacerbation   The following Work up has been ordered so far:  Orders Placed This Encounter  Procedures   Resp Panel by RT-PCR (Flu A&B, Covid) Anterior Nasal Swab   DG Chest Portable 1 View   CT HEAD WO CONTRAST (5MM)   CBC with Differential   Comprehensive metabolic panel   Lipase, blood   TSH   Ammonia   T4, free   Brain natriuretic peptide   Urinalysis, Routine w reflex microscopic   T3   CK   Magnesium   Phosphorus   Prealbumin   Urinalysis, Complete w Microscopic   In and Out Cath   Cardiac monitoring   Consult to hospitalist   Airborne and Contact precautions   Bipap   I-Stat arterial blood gas, ED [facility] ED only)   POC CBG, ED   I-Stat venous blood gas, ([place] ED only)   I-Stat venous blood gas, [facility] ED only)   ED EKG   EKG 12-Lead   Admit to Inpatient (patient's expected length of stay will be greater than 2 midnights or inpatient only  procedure)     OTHER Significant initial  Findings:  labs showing:    Recent Labs  Lab 09/01/22 1424 09/01/22 1442 09/01/22 1551 09/01/22 1806  NA 141 139 140 139  K 5.4* 4.7 5.2* 4.3  CO2 35*  --   --   --   GLUCOSE 120*  --   --   --   BUN 8  --   --   --   CREATININE 0.71  --   --   --   CALCIUM 9.3  --   --   --     Cr  stable,    Lab Results  Component Value Date   CREATININE 0.71 09/01/2022   CREATININE 0.77 08/15/2022   CREATININE 1.13 (H) 07/21/2022    Recent Labs  Lab 09/01/22 1424  AST 28  ALT 15  ALKPHOS 50  BILITOT 0.4  PROT 7.0  ALBUMIN 3.9   Lab Results  Component Value Date   CALCIUM 9.3 09/01/2022   PHOS 4.3 07/21/2022        Plt: Lab Results  Component  Value Date   PLT 187 09/01/2022      COVID-19 Labs  No results for input(s): "DDIMER", "FERRITIN", "LDH", "CRP" in the last 72 hours.  Lab Results  Component Value Date   SARSCOV2NAA NEGATIVE 09/01/2022   SARSCOV2NAA NEGATIVE 07/18/2022   SARSCOV2NAA NEGATIVE 04/28/2022   [place] NEGATIVE 07/30/2021   Venous  Blood Gas result:  pH   7.347  P CO2 83.8 High Panic         Component Value Date/Time   PHART 7.226 (L) 09/01/2022 1442   PCO2ART 101.0 (HH) 09/01/2022 1442   PO2ART 67 (L) 09/01/2022 1442   HCO3 45.9 (H) 09/01/2022 1806   TCO2 48 (H) 09/01/2022 1806   O2SAT 58 09/01/2022 1806         Recent Labs  Lab 09/01/22 1424 09/01/22 1442 09/01/22 1551 09/01/22 1806  WBC 5.5  --   --   --   NEUTROABS 4.4  --   --   --   HGB 13.8 13.9 14.6 14.6  HCT 46.1* 41.0 43.0 43.0  MCV 105.7*  --   --   --   PLT 187  --   --   --     HG/HCT  stable,       Component Value Date/Time   HGB 14.6 09/01/2022 1806   HGB 14.1 08/06/2016 1404   HCT 43.0 09/01/2022 1806   HCT 33.9 (L) 03/28/2018 0336   HCT 44.6 08/06/2016 1404   MCV 105.7 (H) 09/01/2022 1424   MCV 92.3 08/06/2016 1404     Recent Labs  Lab 09/01/22 1424  LIPASE 30   No results for input(s): "AMMONIA" in the last 168 hours.    Cardiac Panel (last 3 results) No results for input(s): "CKTOTAL", "CKMB", "TROPONINI", "RELINDX" in the last 72 hours.  .car BNP (last 3 results) Recent Labs    04/27/22 1642 07/18/22 0400 09/01/22 1426  BNP 16.6 19.6 16.7     DM  labs:  HbA1C: Recent Labs    02/25/22 1532 08/27/22 1344  HGBA1C 6.5* 5.9*    CBG (last 3)  Recent Labs    09/01/22 1458  GLUCAP 115*    Cultures:    Component Value Date/Time   SDES BLOOD RIGHT ANTECUBITAL 03/01/2020 1217   SDES BLOOD LEFT HAND 03/01/2020 1217   SPECREQUEST  03/01/2020 1217    BOTTLES DRAWN AEROBIC AND ANAEROBIC  Blood Culture results may not be optimal due to an inadequate volume of blood received in culture  bottles   SPECREQUEST  03/01/2020 1217    AEROBIC BOTTLE ONLY Blood Culture results may not be optimal due to an inadequate volume of blood received in culture bottles   CULT  03/01/2020 1217    NO GROWTH 5 DAYS Performed at [facility], [place] [address] [place], [place] [address]    CULT  03/01/2020 1217    NO GROWTH 5 DAYS Performed at [facility], [place] [address] [place], [place] [address]    REPTSTATUS 03/06/2020 FINAL 03/01/2020 1217   REPTSTATUS 03/06/2020 FINAL 03/01/2020 1217     Radiological Exams on Admission: CT HEAD WO CONTRAST (5MM)  Result Date: 09/01/2022 CLINICAL DATA:  Altered mental status. EXAM: CT HEAD WITHOUT CONTRAST TECHNIQUE: Contiguous axial images were obtained from the base of the skull through the vertex without intravenous contrast. RADIATION DOSE REDUCTION: This exam was performed according to the departmental dose-optimization program which includes automated exposure control, adjustment of the mA and/or kV according to patient size and/or use of iterative reconstruction technique. COMPARISON:  August 14, 2019. FINDINGS: Brain: No evidence of acute infarction, hemorrhage, hydrocephalus, extra-axial collection or mass lesion/mass effect. Vascular: No hyperdense vessel or unexpected calcification. Skull: Normal. Negative for fracture or focal lesion. Sinuses/Orbits: No acute finding. Other: None. IMPRESSION: No acute intracranial abnormality seen. Electronically Signed   By: Marijo Conception M.D.   On: 09/01/2022 15:23   DG Chest Portable 1 View  Result Date: 09/01/2022 CLINICAL DATA:  Frothy sputum and wheezing EXAM: PORTABLE CHEST 1 VIEW COMPARISON:  07/09/2022 FINDINGS: Subtle airspace density in the RIGHT upper lobe is improved from comparison exam. Small LEFT effusion is similar. No focal consolidation. No pneumothorax. Atherosclerotic calcification of the aorta. IMPRESSION: 1. Persistent nodular airspace disease in the RIGHT upper lobe. 2.  Persistent small LEFT effusion. 3. No progressive finding from comparison exam 07/18/2022. Electronically Signed   By: Suzy Bouchard M.D.   On: 09/01/2022 14:59   _______________________________________________________________________________________________________ Latest  Blood pressure 127/63, pulse 86, temperature 98.2 F (36.8 C), temperature source Oral, resp. rate 14, height 5' (1.524 m), weight 54.9 kg, SpO2 99 %.   Vitals  labs and radiology finding personally reviewed  Review of Systems:    Pertinent positives include:   fatigue,  confusion Constitutional:  No weight loss, night sweats, Fevers, chills,weight loss  HEENT:  No headaches, Difficulty swallowing,Tooth/dental problems,Sore throat,  No sneezing, itching, ear ache, nasal congestion, post nasal drip,  Cardio-vascular:  No chest pain, Orthopnea, PND, anasarca, dizziness, palpitations.no Bilateral lower extremity swelling  GI:  No heartburn, indigestion, abdominal pain, nausea, vomiting, diarrhea, change in bowel habits, loss of appetite, melena, blood in stool, hematemesis Resp:  no shortness of breath at rest. No dyspnea on exertion, No excess mucus, no productive cough, No non-productive cough, No coughing up of blood.No change in color of mucus.No wheezing. Skin:  no rash or lesions. No jaundice GU:  no dysuria, change in color of urine, no urgency or frequency. No straining to urinate.  No flank pain.  Musculoskeletal:  No joint pain or no joint swelling. No decreased range of motion. No back pain.  Psych:  No change in mood or affect. No depression or anxiety. No memory loss.  Neuro: no localizing neurological complaints, no tingling, no weakness, no double vision, no gait abnormality, no slurred speech, no   All systems reviewed and apart from  HOPI all are negative _______________________________________________________________________________________________ Past Medical History:   Past Medical History:   Diagnosis Date   ALLERGIC RHINITIS 05/01/2007   Anxiety    Arthritis    "hands" (12/28/2015)   ASTHMA 05/01/2007   "since I was a child"   Cancer ([place])    LUNG   CHF (congestive heart failure) ([place])    Chronic bronchitis (HCC)    COPD (chronic obstructive pulmonary disease) ([place])    DEPRESSION 07/21/2008   DVT (deep venous thrombosis) ([place]) 1960s   LLE   FATIGUE 10/10/2010   HYPERTENSION 05/01/2007   HYPOTHYROIDISM 08/23/2009   s/p RAI   LUMBAR RADICULOPATHY, RIGHT 08/25/2008   On home oxygen therapy    "3-4L; qd; all the time" (12/28/2015)   OSTEOPOROSIS 05/01/2007   SHOULDER PAIN, LEFT 08/23/2009   SINUSITIS, CHRONIC 10/10/2010     Past Surgical History:  Procedure Laterality Date   APPENDECTOMY     DILATION AND CURETTAGE OF UTERUS  multiple   history of multiple dialations and curettages and miscarriages, unfortunately never carrying a child to term   ECTOPIC PREGNANCY SURGERY  "late '60s or early '70s   ELECTROCARDIOGRAM  06/20/2006   OOPHORECTOMY Right     Social History:  Ambulatory   independently     reports that she quit smoking about 6 years ago. Her smoking use included cigarettes. She started smoking about 58 years ago. She has a 52.00 pack-year smoking history. She has never used smokeless tobacco. She reports that she does not currently use alcohol. She reports that she does not currently use drugs.    Family History:   Family History  Problem Relation Age of Onset   Lung cancer Father    Alcohol abuse Brother    Diabetes Brother    Hypertension Other    Stroke Mother    Thyroid disease Mother    Asthma Other        maternal aunts   Breast cancer Neg Hx    ______________________________________________________________________________________________ Allergies: Allergies  Allergen Reactions   Alendronate Sodium Other (See Comments)    Patient does not remember this reaction     Prior to Admission medications   Medication Sig Start Date End Date  Taking? Authorizing Provider  acetaminophen (TYLENOL) 325 MG tablet Take 2 tablets (650 mg total) by mouth every 6 (six) hours as needed for mild pain (or Fever >/= 101). 05/01/22   Georgette Shell, MD  albuterol (VENTOLIN HFA) 108 (90 Base) MCG/ACT inhaler INHALE 1 TO 2 PUFFS INTO THE LUNGS EVERY 6 HOURS AS NEEDED FOR WHEEZING OR SHORTNESS OF BREATH 08/19/22   Biagio Borg, MD  aspirin EC 81 MG EC tablet Take 1 tablet (81 mg total) by mouth daily. 03/27/16   Velvet Bathe, MD  atorvastatin (LIPITOR) 10 MG tablet TAKE 1 TABLET (10 MG TOTAL) BY MOUTH DAILY. 08/19/22   Biagio Borg, MD  azithromycin (ZITHROMAX) 250 MG tablet Take 1 tablet (250 mg total) by mouth daily. Patient not taking: Reported on 08/27/2022 07/23/22   Biagio Borg, MD  citalopram (CELEXA) 20 MG tablet TAKE ONE TABLET BY MOUTH DAILY 07/23/22   Biagio Borg, MD  docusate sodium (COLACE) 100 MG capsule Take 1 capsule (100 mg total) by mouth 2 (two) times daily. 07/31/22   Biagio Borg, MD  fluticasone (FLONASE) 50 MCG/ACT nasal spray Place 1 spray into both nostrils daily. 05/02/22   Georgette Shell, MD  Fluticasone-Umeclidin-Vilant (TRELEGY ELLIPTA) 200-62.5-25 MCG/ACT AEPB INHALE 1  PUFF INTO THE LUNGS DAILY. 05/21/22   Biagio Borg, MD  furosemide (LASIX) 20 MG tablet Take 1 tablet (20 mg total) by mouth daily. 05/01/22 05/01/23  Georgette Shell, MD  ipratropium-albuterol (DUONEB) 0.5-2.5 (3) MG/3ML SOLN Take 3 mLs by nebulization every 6 (six) hours as needed. Patient taking differently: Take 3 mLs by nebulization every 6 (six) hours as needed (for shortness of breath). 11/05/21   Rigoberto Noel, MD  levothyroxine (SYNTHROID) 50 MCG tablet Take 1 tablet (50 mcg total) by mouth daily. 08/29/22   Shamleffer, Melanie Crazier, MD  loratadine (CLARITIN) 10 MG tablet Take 1 tablet (10 mg total) by mouth daily. Patient taking differently: Take 10 mg by mouth daily as needed for allergies. 05/02/22   Georgette Shell, MD   montelukast (SINGULAIR) 10 MG tablet Take 1 tablet (10 mg total) by mouth at bedtime. 07/21/22   Danford, Suann Larry, MD  OXYGEN Inhale 3 L into the lungs continuous. 24/7    [provider]  potassium chloride (KLOR-CON M) 10 MEQ tablet Take 1 tablet (10 mEq total) by mouth daily. 06/30/22   Biagio Borg, MD    ___________________________________________________________________________________________________ Physical Exam:    09/01/2022    7:11 PM 09/01/2022    6:30 PM 09/01/2022    6:15 PM  Vitals with BMI  Systolic  474 259  Diastolic  63 62  Pulse 86 80      1. General:  in No  Acute distress   Chronically ill  -appearing 2. Psychological: Alert and   Oriented on BIPAP 3. Head/ENT:   Moist  Mucous Membranes                          Head Non traumatic, neck supple                           Poor Dentition 4. SKIN: normal   Skin turgor,  Skin clean Dry and intact no rash 5. Heart: Regular rate and rhythm no  Murmur, no Rub or gallop 6. Lungs y, no wheezes or crackles  on BIPAP 7. Abdomen: Soft,  non-tender, Non distended bowel sounds present 8. Lower extremities: no clubbing, cyanosis, 2+ edema 9. Neurologically Grossly intact, moving all 4 extremities equally   10. MSK: Normal range of motion    Chart has been reviewed  ______________________________________________________________________________________________  Assessment/Plan 76 y.o. female with medical history significant of COPD on 4L o2, DM2, Diastolic CHF, chronic hypoxic and hypercarbic respiratory failure on oxygen, remote DVT in 1960's, HTN, hypothyroidism, bilateral pulmonary nodules    Admitted for  COPD exacerbation    Present on Admission:  COPD with acute exacerbation ([place])  Hyperthyroidism  Essential hypertension  Chronic diastolic congestive heart failure (HCC)  Acute on chronic respiratory failure with hypoxia and hypercapnia (HCC)  Lung nodules  Diabetes mellitus due to underlying  condition without complication, without long-term current use of insulin (HCC)  Hyperlipidemia  Hyperkalemia     COPD with acute exacerbation ([place])  -  - Will initiate: Steroid taper  -  Antibiotics rocephin - Albuterol  RN, - scheduled duoneb,  -  Breo or Dulera at discharge   -  Mucinex.  Titrate O2 to saturation >90%. Follow patients respiratory status.  VBG improving after being on BIPAP   BiPAP ordered PRN for increased work of breathing.  Currently mentating well no evidence of symptomatic hypercarbia  Hyperthyroidism Hold synthroid for now, will need to follow  Essential hypertension Allow permissive HTN   Chronic diastolic congestive heart failure (HCC) Given 1 dose of LAsix in ER with some imporvemtn will conitnue to gently diurese repeat echo   Acute on chronic respiratory failure with hypoxia and hypercapnia (HCC)  this patient has acute respiratory failure with Hypoxia and  Hypercarbia as documented by the presence of following: O2 saturatio< 90% on RA  pH <7.35 with pCO2 >50   Likely due to:   COPD exacerbation,   Provide O2 therapy and titrate as needed  Continuous pulse ox   check Pulse ox with ambulation prior to discharge    flutter valve ordered   Lung nodules Was supposed to have repeat CT chest done as out pt  Can attempt to obtain once more stable  Diabetes mellitus due to underlying condition without complication, without long-term current use of insulin ([place]) Order sliding scale, diet controlled  Hyperlipidemia Cont lipitor 10 mg po q day  Hyperkalemia Mild have improved after lasix administration will repeat BMET and follow  Acute metabolic encephalopathy Due to hypercapnea now improving will avoid sedatives Continue biPAP    Other plan as per orders.  DVT prophylaxis:  SCD     Code Status:    Code Status: Prior FULL CODE as per patient   I had personally discussed CODE STATUS with patient   Family Communication:   Family not at   Bedside    Disposition Plan:     To home once workup is complete and patient is stable   Following barriers for discharge:                                                      Will need to be able to tolerate PO                             Off BipAP                           Will need consultants to evaluate patient prior to discharge                       Would benefit from PT/OT eval prior to DC  Ordered                                       Consults called:    let pulmonology know pt is here, will see in AM   Admission status:  ED Disposition     ED Disposition  [place]: [facility] [[id number]]  Level of Care: Progressive [102]  Admit to Progressive based on following criteria: RESPIRATORY PROBLEMS hypoxemic/hypercapnic respiratory failure that is responsive to NIPPV (BiPAP) or High Flow Nasal Cannula (6-80 lpm). Frequent assessment/intervention, no > Q2 hrs < Q4 hrs, to maintain oxygenation and pulmonary hygiene.  May admit patient to [facility] or [facility] if equivalent level of care is available:: No  Covid Evaluation: Confirmed COVID Negative  Diagnosis: COPD with acute exacerbation [facility]) [[id number]]  Admitting  Physician: Toy Baker [[id number]]  Attending Physician: Toy Baker [[id number]]  Certification:: I certify this patient will need inpatient services for at least 2 midnights  Estimated Length of Stay: 2             inpatient     I Expect 2 midnight stay secondary to severity of patient's current illness need for inpatient interventions justified by the following:     Severe lab/radiological/exam abnormalities including:    Hypercarbic respiratory failure and extensive comorbidities including:    DM2    CHF  COPD/asthma    That are currently affecting medical management.   I expect  patient to be hospitalized for 2 midnights requiring inpatient medical care.  Patient is at high risk for  adverse outcome (such as loss of life or disability) if not treated.  Indication for inpatient stay as follows:  Severe change from baseline regarding mental status   inability to maintain oral hydration    New or worsening hypoxia   Need for IV antibiotics,  need for biPAP    Level of care    progressive tele indefinitely please discontinue once patient no longer qualifies COVID-19 Labs    Lab Results  Component Value Date   [place] 09/01/2022     Precautions: admitted as   Covid Negative     Tanasha Menees 09/01/2022, 8:42 PM    [facility]     after 2 AM please page floor coverage PA If 7AM-7PM, please contact the day team taking care of the patient using [email or web address]   Patient was evaluated in the context of the global COVID-19 pandemic, which necessitated consideration that the patient might be at risk for infection with the SARS-CoV-2 virus that causes COVID-19. Institutional protocols and algorithms that pertain to the evaluation of patients at risk for COVID-19 are in a state of rapid change based on information released by regulatory bodies including the CDC and federal and state organizations. These policies and algorithms were followed during the patient's care.

## 2022-09-01 NOTE — Progress Notes (Signed)
RT adjusted RR and IPAP per MD. Pt started obtaining VT in the high 1500s. RT returned BIPAP to original settings. Pt is more alert now and breathing at a higher rate(18-24), VT 400s. MD aware. RT will continue to monitor.

## 2022-09-02 ENCOUNTER — Encounter: Payer: Self-pay | Admitting: Internal Medicine

## 2022-09-02 ENCOUNTER — Encounter (HOSPITAL_COMMUNITY): Payer: Self-pay | Admitting: Internal Medicine

## 2022-09-02 ENCOUNTER — Inpatient Hospital Stay (HOSPITAL_COMMUNITY): Payer: Medicare PPO

## 2022-09-02 DIAGNOSIS — J9602 Acute respiratory failure with hypercapnia: Secondary | ICD-10-CM | POA: Diagnosis not present

## 2022-09-02 DIAGNOSIS — J441 Chronic obstructive pulmonary disease with (acute) exacerbation: Secondary | ICD-10-CM | POA: Diagnosis not present

## 2022-09-02 DIAGNOSIS — J9601 Acute respiratory failure with hypoxia: Secondary | ICD-10-CM

## 2022-09-02 LAB — COMPREHENSIVE METABOLIC PANEL
ALT: 12 U/L (ref 0–44)
AST: 21 U/L (ref 15–41)
Albumin: 3.2 g/dL — ABNORMAL LOW (ref 3.5–5.0)
Alkaline Phosphatase: 41 U/L (ref 38–126)
Anion gap: 10 (ref 5–15)
BUN: 9 mg/dL (ref 8–23)
CO2: 40 mmol/L — ABNORMAL HIGH (ref 22–32)
Calcium: 9 mg/dL (ref 8.9–10.3)
Chloride: 91 mmol/L — ABNORMAL LOW (ref 98–111)
Creatinine, Ser: 0.74 mg/dL (ref 0.44–1.00)
GFR, Estimated: >60 mL/min (ref >60)
Glucose, Bld: 133 mg/dL — ABNORMAL HIGH (ref 70–99)
Potassium: 4.2 mmol/L (ref 3.5–5.1)
Sodium: 141 mmol/L (ref 135–145)
Total Bilirubin: 0.4 mg/dL (ref 0.3–1.2)
Total Protein: 6.2 g/dL — ABNORMAL LOW (ref 6.5–8.1)

## 2022-09-02 LAB — RESPIRATORY PANEL BY PCR

## 2022-09-02 LAB — CBG MONITORING, ED
Glucose-Capillary: 112 mg/dL — ABNORMAL HIGH (ref 70–99)
Glucose-Capillary: 120 mg/dL — ABNORMAL HIGH (ref 70–99)
Glucose-Capillary: 198 mg/dL — ABNORMAL HIGH (ref 70–99)

## 2022-09-02 LAB — CBC
HCT: 37.3 % (ref 36.0–46.0)
Hemoglobin: 11.1 g/dL — ABNORMAL LOW (ref 12.0–15.0)
MCH: 31.2 pg (ref 26.0–34.0)
MCHC: 29.8 g/dL — ABNORMAL LOW (ref 30.0–36.0)
MCV: 104.8 fL — ABNORMAL HIGH (ref 80.0–100.0)
Platelets: 186 10*3/uL (ref 150–400)
RBC: 3.56 MIL/uL — ABNORMAL LOW (ref 3.87–5.11)
RDW: 14.6 % (ref 11.5–15.5)
WBC: 6.5 10*3/uL (ref 4.0–10.5)
nRBC: 0 % (ref 0.0–0.2)

## 2022-09-02 LAB — GLUCOSE, CAPILLARY
Glucose-Capillary: 104 mg/dL — ABNORMAL HIGH (ref 70–99)
Glucose-Capillary: 154 mg/dL — ABNORMAL HIGH (ref 70–99)
Glucose-Capillary: 200 mg/dL — ABNORMAL HIGH (ref 70–99)

## 2022-09-02 LAB — PREALBUMIN: Prealbumin: 11 mg/dL — ABNORMAL LOW (ref 18–38)

## 2022-09-02 LAB — MAGNESIUM: Magnesium: 1.3 mg/dL — ABNORMAL LOW (ref 1.7–2.4)

## 2022-09-02 LAB — PHOSPHORUS: Phosphorus: 2.7 mg/dL (ref 2.5–4.6)

## 2022-09-02 MED ORDER — MAGNESIUM SULFATE 4 GM/100ML IV SOLN
4.0000 g | Freq: Once | INTRAVENOUS | Status: AC
  Administered 2022-09-02: 4 g via INTRAVENOUS
  Filled 2022-09-02: qty 100

## 2022-09-02 MED ORDER — AZITHROMYCIN 500 MG PO TABS
500.0000 mg | ORAL_TABLET | Freq: Every day | ORAL | Status: DC
  Administered 2022-09-02 – 2022-09-04 (×3): 500 mg via ORAL
  Filled 2022-09-02: qty 1
  Filled 2022-09-02: qty 2
  Filled 2022-09-02: qty 1

## 2022-09-02 MED ORDER — METHYLPREDNISOLONE SODIUM SUCC 40 MG IJ SOLR
40.0000 mg | INTRAMUSCULAR | Status: DC
  Administered 2022-09-03 – 2022-09-04 (×2): 40 mg via INTRAVENOUS
  Filled 2022-09-02 (×2): qty 1

## 2022-09-02 MED ORDER — FUROSEMIDE 10 MG/ML IJ SOLN
40.0000 mg | Freq: Two times a day (BID) | INTRAMUSCULAR | Status: AC
  Administered 2022-09-02 (×2): 40 mg via INTRAVENOUS
  Filled 2022-09-02 (×2): qty 4

## 2022-09-02 NOTE — Progress Notes (Signed)
Physical Therapy Evaluation Patient Details Name: Jasmine [name] MRN: [id number] DOB: [birth year] Today's Date: 09/02/2022  History of Present Illness  76 yo female with onset of COPD flare up was admitted on 11/13 for SOB, AMS, poor responsiveness and with elevation of CO2.  Pt became more alert with bipap potentially helping to clear CO2.  PMHx:  anxiety, asthma, lung CA, depression, DVT, bronchitis, lumbar radiculopathy, osteoporosis, L shoulder pain, HTN, CHF, chronic bronchitis  Clinical Impression  Pt was seen for mobility on RW to go down the hallway and to practice transfers, balance skills and to observe safety with getting on and off gurney.  Pt is demonstrating a bit of distracted behavior, requiring repetitive instructions for all sequences, to wait for PT to gather her lines and to be ready to walk with O2 and telemetry.  Pt is desaturated with a 75' walk on 5L O2, dropping to 76% and requiring pursed lip breathing to recover it, as well as increasing O2 to 10L for a faster return to baseline.  Her efforts are good but unsafe, so recommending SNF for management of pulm challenges and her weaker LE's with more unstable gait.  Pt is home with her husband who may wish to take her directly home.  Even so will recommend this given her tendency to want to walk alone, and with history of no use of walker in the house will likely be unsafely moving.       Recommendations for follow up therapy are one component of a multi-disciplinary discharge planning process, led by the attending physician.  Recommendations may be updated based on patient status, additional functional criteria and insurance authorization.  Follow Up Recommendations Skilled nursing-short term rehab (<3 hours/day) Can patient physically be transported by private vehicle: No    Assistance Recommended at Discharge Frequent or constant Supervision/Assistance  Patient can return home with the following  A little help with walking  and/or transfers;A little help with bathing/dressing/bathroom;Assistance with cooking/housework;Direct supervision/assist for medications management;Direct supervision/assist for financial management;Assist for transportation;Help with stairs or ramp for entrance    Equipment Recommendations None recommended by PT  Recommendations for Other Services       Functional Status Assessment Patient has had a recent decline in their functional status and demonstrates the ability to make significant improvements in function in a reasonable and predictable amount of time.     Precautions / Restrictions Precautions Precautions: Fall Precaution Comments: monitor HR and sats Restrictions Weight Bearing Restrictions: No      Mobility  Bed Mobility Overal bed mobility: Needs Assistance Bed Mobility: Supine to Sit, Sit to Supine     Supine to sit: Mod assist Sit to supine: Mod assist   General bed mobility comments: mod support for trunk then mod for legs to return to bed    Transfers Overall transfer level: Needs assistance Equipment used: Rolling walker (2 wheels) Transfers: Sit to/from Stand Sit to Stand: Supervision, Min guard           General transfer comment: pt was quick to stand and then    Ambulation/Gait Ambulation/Gait assistance: Min assist Gait Distance (Feet): 75 Feet Assistive device: Rolling walker (2 wheels), 1 person hand held assist Gait Pattern/deviations: Step-through pattern, Decreased stride length, Wide base of support, Shuffle, Drifts right/left Gait velocity: reduced Gait velocity interpretation: <1.31 ft/sec, indicative of household ambulator Pre-gait activities: standing O2 ck General Gait Details: pt is impulsively standing up bedside and even on 5L O2 was desaturated to 76% with the  walk, but recovered to 97% with increased O2 to 10 L briefly, returned to baseline once there  Stairs            Wheelchair Mobility    Modified Rankin (Stroke  Patients Only)       Balance Overall balance assessment: Needs assistance                                           Pertinent Vitals/Pain Pain Assessment Pain Assessment: Faces Faces Pain Scale: No hurt    Home Living Family/patient expects to be discharged to:: Private residence Living Arrangements: Spouse/significant other Available Help at Discharge: Family;Available 24 hours/day Type of Home: House Home Access: Ramped entrance       Home Layout: One level Home Equipment: Rollator (4 wheels);Shower seat;Grab bars - tub/shower;BSC/3in1;Wheelchair - Publishing copy (2 wheels) Additional Comments: 3-4 LO2 baseline    Prior Function Prior Level of Function : Independent/Modified Independent             Mobility Comments: rollator out only       Hand Dominance   Dominant Hand: Right    Extremity/Trunk Assessment   Upper Extremity Assessment Upper Extremity Assessment: Overall WFL for tasks assessed    Lower Extremity Assessment Lower Extremity Assessment: Generalized weakness    Cervical / Trunk Assessment Cervical / Trunk Assessment: Kyphotic (mld)  Communication   Communication: No difficulties  Cognition Arousal/Alertness: Awake/alert Behavior During Therapy: Impulsive, Restless Overall Cognitive Status: No family/caregiver present to determine baseline cognitive functioning                                 General Comments: unclear if this is baseline        General Comments General comments (skin integrity, edema, etc.): pt is unsafe walking with RW alone, due to impulsive turns and does desat with 5L O2    Exercises     Assessment/Plan    PT Assessment Patient needs continued PT services  PT Problem List Cardiopulmonary status limiting activity;Decreased strength;Decreased balance;Decreased coordination;Decreased cognition;Decreased safety awareness;Decreased activity tolerance       PT Treatment  Interventions DME instruction;Gait training;Functional mobility training;Therapeutic activities;Therapeutic exercise;Balance training;Neuromuscular re-education;Patient/family education    PT Goals (Current goals can be found in the Care Plan section)  Acute Rehab PT Goals Patient Stated Goal: to get home as soon as possible PT Goal Formulation: With patient Time For Goal Achievement: 09/16/22 Potential to Achieve Goals: Good    Frequency Min 3X/week     Co-evaluation               AM-PAC PT "6 Clicks" Mobility  Outcome Measure Help needed turning from your back to your side while in a flat bed without using bedrails?: None Help needed moving from lying on your back to sitting on the side of a flat bed without using bedrails?: A Little Help needed moving to and from a bed to a chair (including a wheelchair)?: A Little Help needed standing up from a chair using your arms (e.g., wheelchair or bedside chair)?: A Little Help needed to walk in hospital room?: A Little Help needed climbing 3-5 steps with a railing? : A Lot 6 Click Score: 18    End of Session Equipment Utilized During Treatment: Oxygen Activity Tolerance: Treatment limited secondary to medical complications (Comment)  Patient left: in bed;with call bell/phone within reach Nurse Communication: Mobility status;Other (comment) (information about desaturation) PT Visit Diagnosis: Unsteadiness on feet (R26.81);Muscle weakness (generalized) (M62.81)    Time: 2890-2284 PT Time Calculation (min) (ACUTE ONLY): 35 min   Charges:   PT Evaluation $PT Eval Moderate Complexity: 1 Mod PT Treatments $Gait Training: 8-22 mins       Ramond Dial 09/02/2022, 4:09 PM  Mee Hives, PT PhD Acute Rehab Dept. Number: [place] [facility] [place]

## 2022-09-02 NOTE — ED Notes (Signed)
Patient complaining of magnesium burning. This paramedic paused the medication on the pump. Flushed her IV which is currently patent and no obvious signs of infiltration. Medication was attached to the second IV on the other arm. Patient called out again complaining of pain. I assessed the other IV and it was patent as well with no obvious signs of infiltration. I hung a 228m normal saline with the medication to avoid burning at this time.

## 2022-09-02 NOTE — ED Notes (Signed)
ED TO INPATIENT HANDOFF REPORT  ED Nurse Name and Phone #: Jeannie Done [phone]  S Name/Age/Gender Jasmine [name] 76 y.o. female Room/Bed: 019C/019C  Code Status   Code Status: Full Code  Home/SNF/Other Home Patient oriented to: self, place, time, and situation Is this baseline? Yes   Triage Complete: Triage complete  Chief Complaint COPD with acute exacerbation ([place]) [J44.1]  Triage Note Pt bib [facility], normal last night. woke up at 9am lethargic and altered. 4L O2 baseline. Frothy sputum  Wheezing from left upper lung 43-62 cap 130/60   Allergies Allergies  Allergen Reactions   Alendronate Sodium Other (See Comments)    Patient does not remember this reaction    Level of Care/Admitting Diagnosis ED Disposition     ED Disposition  Admit   Condition  --   [place]: [place] [[id number]]  Level of Care: Progressive [102]  Admit to Progressive based on following criteria: RESPIRATORY PROBLEMS hypoxemic/hypercapnic respiratory failure that is responsive to NIPPV (BiPAP) or High Flow Nasal Cannula (6-80 lpm). Frequent assessment/intervention, no > Q2 hrs < Q4 hrs, to maintain oxygenation and pulmonary hygiene.  May admit patient to [facility] or [facility] if equivalent level of care is available:: No  Covid Evaluation: Confirmed COVID Negative  Diagnosis: COPD with acute exacerbation [facility]) [[id number]]  Admitting Physician: Toy Baker [[id number]]  Attending Physician: Toy Baker [[id number]]  Certification:: I certify this patient will need inpatient services for at least 2 midnights  Estimated Length of Stay: 2          B Medical/Surgery History Past Medical History:  Diagnosis Date   ALLERGIC RHINITIS 05/01/2007   Anxiety    Arthritis    "hands" (12/28/2015)   ASTHMA 05/01/2007   "since I was a child"   Cancer ([place])    LUNG   CHF (congestive heart failure) ([place])    Chronic bronchitis ([place])    COPD (chronic  obstructive pulmonary disease) ([place])    DEPRESSION 07/21/2008   DVT (deep venous thrombosis) ([place]) 1960s   LLE   FATIGUE 10/10/2010   HYPERTENSION 05/01/2007   HYPOTHYROIDISM 08/23/2009   s/p RAI   LUMBAR RADICULOPATHY, RIGHT 08/25/2008   On home oxygen therapy    "3-4L; qd; all the time" (12/28/2015)   OSTEOPOROSIS 05/01/2007   SHOULDER PAIN, LEFT 08/23/2009   SINUSITIS, CHRONIC 10/10/2010   Past Surgical History:  Procedure Laterality Date   APPENDECTOMY     DILATION AND CURETTAGE OF UTERUS  multiple   history of multiple dialations and curettages and miscarriages, unfortunately never carrying a child to term   ECTOPIC PREGNANCY SURGERY  "late '60s or early '70s   ELECTROCARDIOGRAM  06/20/2006   OOPHORECTOMY Right      A IV Location/Drains/Wounds Patient Lines/Drains/Airways Status     Active Line/Drains/Airways     Name Placement date Placement time Site Days   Peripheral IV 09/01/22 22 G Posterior;Right Hand 09/01/22  1442  Hand  1   Peripheral IV 09/01/22 20 G Left Antecubital 09/01/22  1442  Antecubital  1            Intake/Output Last 24 hours No intake or output data in the 24 hours ending 09/02/22 1147  Labs/Imaging Results for orders placed or performed during the hospital encounter of 09/01/22 (from the past 48 hour(s))  CBC with Differential     Status: Abnormal   Collection Time: 09/01/22  2:24 PM  Result Value Ref Range   WBC  5.5 4.0 - 10.5 K/uL   RBC 4.36 3.87 - 5.11 MIL/uL   Hemoglobin 13.8 12.0 - 15.0 g/dL   HCT 46.1 (H) 36.0 - 46.0 %   MCV 105.7 (H) 80.0 - 100.0 fL   MCH 31.7 26.0 - 34.0 pg   MCHC 29.9 (L) 30.0 - 36.0 g/dL   RDW 14.7 11.5 - 15.5 %   Platelets 187 150 - 400 K/uL   nRBC 0.0 0.0 - 0.2 %   Neutrophils Relative % 81 %   Neutro Abs 4.4 1.7 - 7.7 K/uL   Lymphocytes Relative 11 %   Lymphs Abs 0.6 (L) 0.7 - 4.0 K/uL   Monocytes Relative 7 %   Monocytes Absolute 0.4 0.1 - 1.0 K/uL   Eosinophils Relative 0 %   Eosinophils Absolute 0.0  0.0 - 0.5 K/uL   Basophils Relative 0 %   Basophils Absolute 0.0 0.0 - 0.1 K/uL   Immature Granulocytes 1 %   Abs Immature Granulocytes 0.05 0.00 - 0.07 K/uL    Comment: Performed at [facility] [address] [place], [place] [address]  Comprehensive metabolic panel     Status: Abnormal   Collection Time: 09/01/22  2:24 PM  Result Value Ref Range   Sodium 141 135 - 145 mmol/L   Potassium 5.4 (H) 3.5 - 5.1 mmol/L   Chloride 98 98 - 111 mmol/L   CO2 35 (H) 22 - 32 mmol/L   Glucose, Bld 120 (H) 70 - 99 mg/dL    Comment: Glucose reference range applies only to samples taken after fasting for at least 8 hours.   BUN 8 8 - 23 mg/dL   Creatinine, Ser 0.71 0.44 - 1.00 mg/dL   Calcium 9.3 8.9 - 10.3 mg/dL   Total Protein 7.0 6.5 - 8.1 g/dL   Albumin 3.9 3.5 - 5.0 g/dL   AST 28 15 - 41 U/L   ALT 15 0 - 44 U/L   Alkaline Phosphatase 50 38 - 126 U/L   Total Bilirubin 0.4 0.3 - 1.2 mg/dL   GFR, Estimated >60 >60 mL/min    Comment: (NOTE) Calculated using the CKD-EPI Creatinine Equation (2021)    Anion gap 8 5 - 15    Comment: Performed at [facility] [address] [place], [place] [address]  Lipase, blood     Status: None   Collection Time: 09/01/22  2:24 PM  Result Value Ref Range   Lipase 30 11 - 51 U/L    Comment: Performed at [facility], [place] [address], [place], [place] [address]  T4, free     Status: Abnormal   Collection Time: 09/01/22  2:24 PM  Result Value Ref Range   Free T4 1.20 (H) 0.61 - 1.12 ng/dL    Comment: (NOTE) Biotin ingestion may interfere with free T4 tests. If the results are inconsistent with the TSH level, previous test results, or the clinical presentation, then consider biotin interference. If needed, order repeat testing after stopping biotin. Performed at [facility], [place] [address], [place], [place] [address]   Troponin I (High Sensitivity)     Status: None   Collection Time: 09/01/22  2:24 PM  Result Value Ref Range    Troponin I (High Sensitivity) 8 <18 ng/L    Comment: (NOTE) Elevated high sensitivity troponin I (hsTnI) values and significant  changes across serial measurements may suggest ACS but many other  chronic and acute conditions are known to elevate  hsTnI results.  Refer to the "Links" section for chest pain algorithms and additional  guidance. Performed at [facility], [place] [address] [place], [place] [address]   TSH     Status: Abnormal   Collection Time: 09/01/22  2:26 PM  Result Value Ref Range   TSH 0.316 (L) 0.350 - 4.500 uIU/mL    Comment: Performed by a 3rd Generation assay with a functional sensitivity of <=0.01 uIU/mL. Performed at [name], [place] [address] [place], [place] [address]   Brain natriuretic peptide     Status: None   Collection Time: 09/01/22  2:26 PM  Result Value Ref Range   B Natriuretic Peptide 16.7 0.0 - 100.0 pg/mL    Comment: Performed at [facility] [address] [place], [place] [address]  Resp Panel by RT-PCR (Flu A&B, Covid) Anterior Nasal Swab     Status: None   Collection Time: 09/01/22  2:29 PM   Specimen: Anterior Nasal Swab  Result Value Ref Range   SARS Coronavirus 2 by RT PCR NEGATIVE NEGATIVE    Comment: (NOTE) SARS-CoV-2 target nucleic acids are NOT DETECTED.  The SARS-CoV-2 RNA is generally detectable in upper respiratory specimens during the acute phase of infection. The lowest concentration of SARS-CoV-2 viral copies this assay can detect is 138 copies/mL. A negative result does not preclude SARS-Cov-2 infection and should not be used as the sole basis for treatment or other patient management decisions. A negative result may occur with  improper specimen collection/handling, submission of specimen other than nasopharyngeal swab, presence of viral mutation(s) within the areas targeted by this assay, and inadequate number of viral copies(<138 copies/mL). A negative result must be combined with clinical  observations, patient history, and epidemiological information. The expected result is Negative.  Fact Sheet for Patients:  EntrepreneurPulse.com.au  Fact Sheet for Healthcare Providers:  IncredibleEmployment.be  This test is no t yet approved or cleared by the [place] FDA and  has been authorized for detection and/or diagnosis of SARS-CoV-2 by FDA under an Emergency Use Authorization (EUA). This EUA will remain  in effect (meaning this test can be used) for the duration of the COVID-19 declaration under Section 564(b)(1) of the Act, 21 U.S.C.section 360bbb-3(b)(1), unless the authorization is terminated  or revoked sooner.       Influenza A by PCR NEGATIVE NEGATIVE   Influenza B by PCR NEGATIVE NEGATIVE    Comment: (NOTE) The Xpert Xpress SARS-CoV-2/FLU/RSV plus assay is intended as an aid in the diagnosis of influenza from Nasopharyngeal swab specimens and should not be used as a sole basis for treatment. Nasal washings and aspirates are unacceptable for Xpert Xpress SARS-CoV-2/FLU/RSV testing.  Fact Sheet for Patients: EntrepreneurPulse.com.au  Fact Sheet for Healthcare Providers: IncredibleEmployment.be  This test is not yet approved or cleared by the [place] FDA and has been authorized for detection and/or diagnosis of SARS-CoV-2 by FDA under an Emergency Use Authorization (EUA). This EUA will remain in effect (meaning this test can be used) for the duration of the COVID-19 declaration under Section 564(b)(1) of the Act, 21 U.S.C. section 360bbb-3(b)(1), unless the authorization is terminated or revoked.  Performed at [facility], [place] [address], [place], [place] [address]   Respiratory (~20 pathogens) panel by PCR     Status: None   Collection Time: 09/01/22  2:29 PM   Specimen: Nasopharyngeal Swab; Respiratory  Result Value Ref Range   Adenovirus NOT DETECTED NOT DETECTED  Coronavirus 229E NOT DETECTED NOT DETECTED    Comment: (NOTE) The Coronavirus on the Respiratory Panel, DOES NOT test for the novel  Coronavirus (2019 nCoV)    Coronavirus HKU1 NOT DETECTED NOT DETECTED   Coronavirus NL63 NOT DETECTED NOT DETECTED   Coronavirus OC43 NOT DETECTED NOT DETECTED   Metapneumovirus NOT DETECTED NOT DETECTED   Rhinovirus / Enterovirus NOT DETECTED NOT DETECTED   Influenza A NOT DETECTED NOT DETECTED   Influenza B NOT DETECTED NOT DETECTED   Parainfluenza Virus 1 NOT DETECTED NOT DETECTED   Parainfluenza Virus 2 NOT DETECTED NOT DETECTED   Parainfluenza Virus 3 NOT DETECTED NOT DETECTED   Parainfluenza Virus 4 NOT DETECTED NOT DETECTED   Respiratory Syncytial Virus NOT DETECTED NOT DETECTED   Bordetella pertussis NOT DETECTED NOT DETECTED   Bordetella Parapertussis NOT DETECTED NOT DETECTED   Chlamydophila pneumoniae NOT DETECTED NOT DETECTED   Mycoplasma pneumoniae NOT DETECTED NOT DETECTED    Comment: Performed at [facility], [place] [address] [place], [place] [address]  I-Stat arterial blood gas, ED [facility] ED only)     Status: Abnormal   Collection Time: 09/01/22  2:42 PM  Result Value Ref Range   pH, Arterial 7.226 (L) 7.35 - 7.45   pCO2 arterial 101.0 (HH) 32 - 48 mmHg   pO2, Arterial 67 (L) 83 - 108 mmHg   Bicarbonate 42.0 (H) 20.0 - 28.0 mmol/L   TCO2 45 (H) 22 - 32 mmol/L   O2 Saturation 87 %   Acid-Base Excess 10.0 (H) 0.0 - 2.0 mmol/L   Sodium 139 135 - 145 mmol/L   Potassium 4.7 3.5 - 5.1 mmol/L   Calcium, Ion 1.29 1.15 - 1.40 mmol/L   HCT 41.0 36.0 - 46.0 %   Hemoglobin 13.9 12.0 - 15.0 g/dL   Patient temperature 98.2 F    Collection site RADIAL, ALLEN'S TEST ACCEPTABLE    Drawn by RT    Sample type ARTERIAL    Comment NOTIFIED PHYSICIAN   POC CBG, ED     Status: Abnormal   Collection Time: 09/01/22  2:58 PM  Result Value Ref Range   Glucose-Capillary 115 (H) 70 - 99 mg/dL    Comment: Glucose reference range applies only to  samples taken after fasting for at least 8 hours.  I-Stat venous blood gas, [facility] ED only)     Status: Abnormal   Collection Time: 09/01/22  3:51 PM  Result Value Ref Range   pH, Ven 7.229 (L) 7.25 - 7.43   pCO2, Ven 102.1 (HH) 44 - 60 mmHg   pO2, Ven 35 32 - 45 mmHg   Bicarbonate 42.7 (H) 20.0 - 28.0 mmol/L   TCO2 46 (H) 22 - 32 mmol/L   O2 Saturation 51 %   Acid-Base Excess 10.0 (H) 0.0 - 2.0 mmol/L   Sodium 140 135 - 145 mmol/L   Potassium 5.2 (H) 3.5 - 5.1 mmol/L   Calcium, Ion 1.22 1.15 - 1.40 mmol/L   HCT 43.0 36.0 - 46.0 %   Hemoglobin 14.6 12.0 - 15.0 g/dL   Sample type VENOUS    Comment NOTIFIED PHYSICIAN   Urinalysis, Routine w reflex microscopic Urine, In & Out Cath     Status: Abnormal   Collection Time: 09/01/22  4:08 PM  Result Value Ref Range   Color, Urine YELLOW YELLOW   APPearance CLEAR CLEAR   Specific Gravity, Urine 1.020 1.005 - 1.030   pH 5.0 5.0 - 8.0   Glucose, UA  NEGATIVE NEGATIVE mg/dL   Hgb urine dipstick NEGATIVE NEGATIVE   Bilirubin Urine NEGATIVE NEGATIVE   Ketones, ur 5 (A) NEGATIVE mg/dL   Protein, ur 100 (A) NEGATIVE mg/dL   Nitrite NEGATIVE NEGATIVE   Leukocytes,Ua NEGATIVE NEGATIVE   RBC / HPF 0-5 0 - 5 RBC/hpf   WBC, UA 0-5 0 - 5 WBC/hpf   Bacteria, UA NONE SEEN NONE SEEN   Mucus PRESENT     Comment: Performed at [facility] [address] [place], [place] [address]  Troponin I (High Sensitivity)     Status: None   Collection Time: 09/01/22  4:36 PM  Result Value Ref Range   Troponin I (High Sensitivity) 8 <18 ng/L    Comment: (NOTE) Elevated high sensitivity troponin I (hsTnI) values and significant  changes across serial measurements may suggest ACS but many other  chronic and acute conditions are known to elevate hsTnI results.  Refer to the "Links" section for chest pain algorithms and additional  guidance. Performed at [facility], [place] [address] [place], [place] [address]   I-Stat venous blood gas, [facility] ED only)      Status: Abnormal   Collection Time: 09/01/22  6:06 PM  Result Value Ref Range   pH, Ven 7.347 7.25 - 7.43   pCO2, Ven 83.8 (HH) 44 - 60 mmHg   pO2, Ven 34 32 - 45 mmHg   Bicarbonate 45.9 (H) 20.0 - 28.0 mmol/L   TCO2 48 (H) 22 - 32 mmol/L   O2 Saturation 58 %   Acid-Base Excess 16.0 (H) 0.0 - 2.0 mmol/L   Sodium 139 135 - 145 mmol/L   Potassium 4.3 3.5 - 5.1 mmol/L   Calcium, Ion 1.12 (L) 1.15 - 1.40 mmol/L   HCT 43.0 36.0 - 46.0 %   Hemoglobin 14.6 12.0 - 15.0 g/dL   Sample type VENOUS    Comment NOTIFIED PHYSICIAN   CBG monitoring, ED     Status: Abnormal   Collection Time: 09/01/22  9:12 PM  Result Value Ref Range   Glucose-Capillary 143 (H) 70 - 99 mg/dL    Comment: Glucose reference range applies only to samples taken after fasting for at least 8 hours.  I-Stat arterial blood gas, ED     Status: Abnormal   Collection Time: 09/01/22 11:39 PM  Result Value Ref Range   pH, Arterial 7.480 (H) 7.35 - 7.45   pCO2 arterial 60.6 (H) 32 - 48 mmHg   pO2, Arterial 94 83 - 108 mmHg   Bicarbonate 45.3 (H) 20.0 - 28.0 mmol/L   TCO2 47 (H) 22 - 32 mmol/L   O2 Saturation 98 %   Acid-Base Excess 18.0 (H) 0.0 - 2.0 mmol/L   Sodium 139 135 - 145 mmol/L   Potassium 3.9 3.5 - 5.1 mmol/L   Calcium, Ion 1.15 1.15 - 1.40 mmol/L   HCT 39.0 36.0 - 46.0 %   Hemoglobin 13.3 12.0 - 15.0 g/dL   Patient temperature 97.8 F    Collection site RADIAL, ALLEN'S TEST ACCEPTABLE    Drawn by RT    Sample type ARTERIAL   CBG monitoring, ED     Status: Abnormal   Collection Time: 09/01/22 11:54 PM  Result Value Ref Range   Glucose-Capillary 146 (H) 70 - 99 mg/dL    Comment: Glucose reference range applies only to samples taken after fasting for at least 8 hours.  Prealbumin     Status: Abnormal   Collection Time: 09/02/22  4:19 AM  Result Value Ref Range   Prealbumin 11 (L) 18 - 38 mg/dL    Comment: Performed at [facility], [place] [address], [place], [place] [address]  Comprehensive  metabolic panel     Status: Abnormal   Collection Time: 09/02/22  4:19 AM  Result Value Ref Range   Sodium 141 135 - 145 mmol/L   Potassium 4.2 3.5 - 5.1 mmol/L   Chloride 91 (L) 98 - 111 mmol/L   CO2 40 (H) 22 - 32 mmol/L   Glucose, Bld 133 (H) 70 - 99 mg/dL    Comment: Glucose reference range applies only to samples taken after fasting for at least 8 hours.   BUN 9 8 - 23 mg/dL   Creatinine, Ser 0.74 0.44 - 1.00 mg/dL   Calcium 9.0 8.9 - 10.3 mg/dL   Total Protein 6.2 (L) 6.5 - 8.1 g/dL   Albumin 3.2 (L) 3.5 - 5.0 g/dL   AST 21 15 - 41 U/L   ALT 12 0 - 44 U/L   Alkaline Phosphatase 41 38 - 126 U/L   Total Bilirubin 0.4 0.3 - 1.2 mg/dL   GFR, Estimated >60 >60 mL/min    Comment: (NOTE) Calculated using the CKD-EPI Creatinine Equation (2021)    Anion gap 10 5 - 15    Comment: Performed at [facility], [place] [address], [place], [place] [address]  CBC     Status: Abnormal   Collection Time: 09/02/22  4:19 AM  Result Value Ref Range   WBC 6.5 4.0 - 10.5 K/uL   RBC 3.56 (L) 3.87 - 5.11 MIL/uL   Hemoglobin 11.1 (L) 12.0 - 15.0 g/dL   HCT 37.3 36.0 - 46.0 %   MCV 104.8 (H) 80.0 - 100.0 fL   MCH 31.2 26.0 - 34.0 pg   MCHC 29.8 (L) 30.0 - 36.0 g/dL   RDW 14.6 11.5 - 15.5 %   Platelets 186 150 - 400 K/uL   nRBC 0.0 0.0 - 0.2 %    Comment: Performed at [facility], [place] [address], [place], [place] [address]  Magnesium     Status: Abnormal   Collection Time: 09/02/22  4:19 AM  Result Value Ref Range   Magnesium 1.3 (L) 1.7 - 2.4 mg/dL    Comment: Performed at [facility] [address], [place], [place] [address]  Phosphorus     Status: None   Collection Time: 09/02/22  4:19 AM  Result Value Ref Range   Phosphorus 2.7 2.5 - 4.6 mg/dL    Comment: Performed at [facility] [address], [place],  [address]  CBG monitoring, ED     Status: Abnormal   Collection Time: 09/02/22  5:29 AM  Result Value Ref Range   Glucose-Capillary 112 (H) 70 - 99  mg/dL    Comment: Glucose reference range applies only to samples taken after fasting for at least 8 hours.  CBG monitoring, ED     Status: Abnormal   Collection Time: 09/02/22  8:26 AM  Result Value Ref Range   Glucose-Capillary 120 (H) 70 - 99 mg/dL    Comment: Glucose reference range applies only to samples taken after fasting for at least 8 hours.   Comment 1 Notify RN    Comment 2 Document in Chart    *Note: Due to a large number of results and/or encounters for the requested time period, some results have not been displayed. A  complete set of results can be found in Results Review.   CT CHEST WO CONTRAST  Result Date: 09/02/2022 CLINICAL DATA:  Shortness of breath EXAM: CT CHEST WITHOUT CONTRAST TECHNIQUE: Multidetector CT imaging of the chest was performed following the standard protocol without IV contrast. RADIATION DOSE REDUCTION: This exam was performed according to the departmental dose-optimization program which includes automated exposure control, adjustment of the mA and/or kV according to patient size and/or use of iterative reconstruction technique. COMPARISON:  CT 04/27/2022, 11/24/2018 FINDINGS: Cardiovascular: Heart size within normal limits. No significant pericardial effusion. Thoracic aorta is nonaneurysmal. Scattered atherosclerotic vascular calcifications of the aorta and coronary arteries. Central pulmonary vasculature is within normal limits. Mediastinum/Nodes: No enlarged mediastinal or axillary lymph nodes. Thyroid gland, trachea, and esophagus demonstrate no significant findings. Lungs/Pleura: Small left and trace right pleural effusions with associated compressive atelectasis. Chronic scarring in the periphery of the right upper lobe (series 4, image 66), unchanged. Stable nodular opacity in the left upper lobe measuring up to 1.0 cm (series 4, image 49). Additional stable 3 mm peripheral left upper lobe nodule seen on the same slice. New 10 x 6 ovoid nodular density  abutting the pleural surface at the left lung base (series 4, image 128) which could be a site of atelectasis. Background of mild emphysematous changes. No pneumothorax. Upper Abdomen: No acute abnormality. Musculoskeletal: No chest wall mass or suspicious bone lesions identified. Subtle superior endplate compression deformity at L1 is unchanged. IMPRESSION: 1. Small left and trace right pleural effusions with associated compressive atelectasis. 2. New 10 x 6 mm ovoid nodular density abutting the pleural surface at the left lung base which could be a site of atelectasis. Consider one of the following in 3 months for both low-risk and high-risk individuals: (a) repeat chest CT, (b) follow-up PET-CT, or (c) tissue sampling. This recommendation follows the consensus statement: Guidelines for Management of Incidental Pulmonary Nodules Detected on CT Images: From the Fleischner Society 2017; Radiology 2017; 284:228-243. 3. Stable left upper lobe nodules measuring up to 1.0 cm. 4. Aortic and coronary artery atherosclerosis (ICD10-I70.0). Electronically Signed   By: Davina Poke D.O.   On: 09/02/2022 09:11   CT HEAD WO CONTRAST (5MM)  Result Date: 09/01/2022 CLINICAL DATA:  Altered mental status. EXAM: CT HEAD WITHOUT CONTRAST TECHNIQUE: Contiguous axial images were obtained from the base of the skull through the vertex without intravenous contrast. RADIATION DOSE REDUCTION: This exam was performed according to the departmental dose-optimization program which includes automated exposure control, adjustment of the mA and/or kV according to patient size and/or use of iterative reconstruction technique. COMPARISON:  August 14, 2019. FINDINGS: Brain: No evidence of acute infarction, hemorrhage, hydrocephalus, extra-axial collection or mass lesion/mass effect. Vascular: No hyperdense vessel or unexpected calcification. Skull: Normal. Negative for fracture or focal lesion. Sinuses/Orbits: No acute finding. Other: None.  IMPRESSION: No acute intracranial abnormality seen. Electronically Signed   By: Marijo Conception M.D.   On: 09/01/2022 15:23   DG Chest Portable 1 View  Result Date: 09/01/2022 CLINICAL DATA:  Frothy sputum and wheezing EXAM: PORTABLE CHEST 1 VIEW COMPARISON:  07/09/2022 FINDINGS: Subtle airspace density in the RIGHT upper lobe is improved from comparison exam. Small LEFT effusion is similar. No focal consolidation. No pneumothorax. Atherosclerotic calcification of the aorta. IMPRESSION: 1. Persistent nodular airspace disease in the RIGHT upper lobe. 2. Persistent small LEFT effusion. 3. No progressive finding from comparison exam 07/18/2022. Electronically Signed   By: Suzy Bouchard M.D.   On:  09/01/2022 14:59    Pending Labs Unresulted Labs (From admission, onward)     Start     Ordered   09/03/22 0500  Phosphorus  Tomorrow morning,   R        09/02/22 0540   09/03/22 0500  Magnesium  Daily at 5am,   R     Question:  Specimen collection method  Answer:  Lab=Lab collect   09/02/22 0540   09/03/22 0500  CBC with Differential/Platelet  Daily at 5am,   R     Question:  Specimen collection method  Answer:  Lab=Lab collect   09/02/22 0540   09/03/22 0500  Brain natriuretic peptide  Daily at 5am,   R     Question:  Specimen collection method  Answer:  Lab=Lab collect   09/02/22 0540   09/03/22 0370  Basic metabolic panel  Daily at 5am,   R     Question:  Specimen collection method  Answer:  Lab=Lab collect   09/02/22 0540   09/02/22 1013  Rapid urine drug screen (hospital performed)  ONCE - STAT,   STAT        09/02/22 1012   09/02/22 0541  Urinalysis, Complete w Microscopic Urine, Clean Catch  Once,   R       Question:  Release to patient  Answer:  Immediate   09/02/22 0540   09/02/22 0540  Ammonia  Add-on,   AD        09/02/22 0540   09/01/22 1934  MRSA Next Gen by PCR, Nasal  (COPD / Pneumonia / Cellulitis / Lower Extremity Wound)  Once,   R        09/01/22 1933   09/01/22 1934   Expectorated Sputum Assessment w Gram Stain, Rflx to Resp Cult  (COPD / Pneumonia / Cellulitis / Lower Extremity Wound)  Once,   R        09/01/22 1933            Vitals/Pain Today's Vitals   09/02/22 0915 09/02/22 0930 09/02/22 0945 09/02/22 1045  BP: (!) 123/59 (!) 126/56 136/67 (!) 117/58  Pulse:   76 89  Resp: 16 17 (!) 21 16  Temp:      TempSrc:      SpO2:   93% 94%  Weight:      Height:        Isolation Precautions Droplet precaution  Medications Medications  aspirin EC tablet 81 mg (81 mg Oral Given 09/02/22 0943)  atorvastatin (LIPITOR) tablet 10 mg (10 mg Oral Given 09/02/22 0943)  ipratropium-albuterol (DUONEB) 0.5-2.5 (3) MG/3ML nebulizer solution 3 mL (3 mLs Nebulization Given 09/02/22 1053)  loratadine (CLARITIN) tablet 10 mg (has no administration in time range)  acetaminophen (TYLENOL) tablet 650 mg (has no administration in time range)    Or  acetaminophen (TYLENOL) suppository 650 mg (has no administration in time range)  HYDROcodone-acetaminophen (NORCO/VICODIN) 5-325 MG per tablet 1-2 tablet (has no administration in time range)  sodium chloride flush (NS) 0.9 % injection 3 mL (3 mLs Intravenous Given 09/02/22 0948)  sodium chloride flush (NS) 0.9 % injection 3 mL (has no administration in time range)  0.9 %  sodium chloride infusion (has no administration in time range)  insulin aspart (novoLOG) injection 0-9 Units ( Subcutaneous Not Given 09/02/22 0845)  methylPREDNISolone sodium succinate (SOLU-MEDROL) 40 mg/mL injection 40 mg (has no administration in time range)  furosemide (LASIX) injection 40 mg (40 mg Intravenous Given 09/02/22 0944)  azithromycin (  ZITHROMAX) tablet 500 mg (500 mg Oral Given 09/02/22 0943)  ipratropium-albuterol (DUONEB) 0.5-2.5 (3) MG/3ML nebulizer solution 3 mL (3 mLs Nebulization Given 09/01/22 1457)  furosemide (LASIX) injection 40 mg (40 mg Intravenous Given 09/01/22 1543)  methylPREDNISolone sodium succinate (SOLU-MEDROL)  125 mg/2 mL injection 125 mg (125 mg Intravenous Given 09/01/22 1544)  ipratropium-albuterol (DUONEB) 0.5-2.5 (3) MG/3ML nebulizer solution 3 mL (3 mLs Nebulization Given 09/01/22 1609)  magnesium sulfate IVPB 4 g 100 mL (0 g Intravenous Stopped 09/02/22 0813)    Mobility walks with device High fall risk   Focused Assessments Pulmonary Assessment Handoff:  Lung sounds: Bilateral Breath Sounds: Clear O2 Device: Nasal Cannula O2 Flow Rate (L/min): 4 L/min    R Recommendations: See Admitting Provider Note  Report given to:   Additional Notes: was on BiPAP, now on [place]. Respirations even/unlabored

## 2022-09-02 NOTE — Progress Notes (Signed)
[facility]) Hospital Liaison note:  This patient is currently enrolled in [facility] outpatient-based Palliative Care. Will continue to follow for disposition.  Please call with any outpatient palliative questions or concerns.  Thank you, Lorelee Market, LPN [facility] Liaison [phone]

## 2022-09-02 NOTE — Progress Notes (Addendum)
PROGRESS NOTE                                                                                                                                                                                                             Patient Demographics:    Jasmine [name], is a 76 y.o. female, DOB - Aug 04, 1946, FTD:[id number]  Outpatient Primary MD for the patient is Biagio Borg, MD    LOS - 1  Admit date - 09/01/2022    Chief Complaint  Patient presents with   Altered Mental Status       Brief Narrative (HPI from H&P)    76 y.o. female with medical history significant of COPD on 4L o2, DM2, Diastolic CHF, chronic hypoxic and hypercarbic respiratory failure on oxygen, remote DVT in 1960's, HTN, hypothyroidism, bilateral pulmonary nodules she presented with shortness of breath and confusion.  She was diagnosed with acute on chronic hypoxic and hypercapnic respiratory failure due to COPD exacerbation and diastolic CHF and admitted to the hospital.  Initially required BiPAP.    Subjective:    [name] today has, No headache, No chest pain, No abdominal pain - No Nausea, No new weakness tingling or numbness, improved SOB   Assessment  & Plan :   Acute on chronic hypoxic and hypercapnic respiratory failure due to COPD exacerbation and acute on chronic diastolic CHF.   She has underlying history of COPD and is on 4 L of nasal cannula oxygen at baseline, she did have hypercapnia and hypoxia upon admission, she was initially treated with BiPAP with good results.  Hypercapnia resolved and mentation is back to baseline.  Continue IV steroids, continue antibiotic we will switch Rocephin to azithromycin for COPD exacerbation.  Currently on 4 L nasal cannula oxygen and close to her baseline which will be continued.   Acute metabolic encephalopathy due to hypercapnia and hypoxia.  Resolved.  No headache, no focal deficits, CT head  stable.  Acute on chronic diastolic CHF EF 06% on echocardiogram done few months ago.  She is definitely has orthopnea and massive peripheral edema along with few crackles, initiate Lasix and monitor.  Placed on fluid restriction.  HX of pulmonary nodules.  Request PCP to arrange for outpatient pulmonary follow-up.  We will get a noncontrast CT here.  Hypothyroidism.  TSH somewhat suppressed, hold Synthroid for  the next 3 to 4 days then resume at a lower dose with outpatient TSH monitoring in 4 weeks by PCP.  Essential hypertension.  Stable off of medications right now.  Dyslipidemia.  On Lipitor.  Hypomagnesemia.  Replaced.    DM type II.  Sliding scale.  Lab Results  Component Value Date   HGBA1C 5.9 (A) 77/05/2022   CBG (last 3)  Recent Labs    09/01/22 2354 09/02/22 0529 09/02/22 0826  GLUCAP 146* 112* 120*         Condition - Extremely Guarded  Family Communication  :    Code Status :  Full  Consults  :  None  PUD Prophylaxis :     Procedures  :     CT head.  Nonacute.   TTE - 04/2022   1. Left ventricular ejection fraction, by estimation, is 70 to 75%. The left ventricle has hyperdynamic function. The left ventricle has no regional wall motion abnormalities. Left ventricular diastolic parameters are consistent with Grade I diastolic dysfunction (impaired relaxation).   2. Right ventricular systolic function is normal. The right ventricular size is normal.   3. The mitral valve is normal in structure. No evidence of mitral valve regurgitation. No evidence of mitral stenosis.   4. The aortic valve is normal in structure. Aortic valve regurgitation is not visualized. No aortic stenosis is present.   5. The inferior vena cava is normal in size with <50% respiratory variability, suggesting right atrial pressure of 8 mmHg.       Disposition Plan  :    Status is: Inpatient  DVT Prophylaxis  :    Place TED hose Start: 09/02/22 0824 SCDs Start: 09/01/22  1934    Lab Results  Component Value Date   PLT 186 09/02/2022    Diet :  Diet Order             Diet Heart Room service appropriate? Yes; Fluid consistency: Thin; Fluid restriction: 1500 mL Fluid  Diet effective now                    Inpatient Medications  Scheduled Meds:  aspirin EC  81 mg Oral Daily   atorvastatin  10 mg Oral Daily   furosemide  40 mg Intravenous Q12H   insulin aspart  0-9 Units Subcutaneous Q4H   ipratropium-albuterol  3 mL Nebulization QID   methylPREDNISolone (SOLU-MEDROL) injection  40 mg Intravenous Q24H   sodium chloride flush  3 mL Intravenous Q12H   Continuous Infusions:  sodium chloride     cefTRIAXone (ROCEPHIN)  IV Stopped (09/01/22 2112)   PRN Meds:.sodium chloride, acetaminophen **OR** acetaminophen, HYDROcodone-acetaminophen, loratadine, sodium chloride flush     Objective:   Vitals:   09/02/22 0537 09/02/22 0545 09/02/22 0600 09/02/22 0800  BP:  (!) 113/56 (!) 116/57 124/63  Pulse:   71 70  Resp:  '14 16 12  '$ Temp: 98.7 F (37.1 C)     TempSrc: Oral     SpO2:   100% 100%  Weight:      Height:        Wt Readings from Last 3 Encounters:  09/01/22 54.9 kg  08/27/22 54.9 kg  08/15/22 55.5 kg    No intake or output data in the 24 hours ending 09/02/22 0827   Physical Exam  Awake Alert, No new F.N deficits, Normal affect [place].AT,PERRAL Supple Neck, No JVD,   Symmetrical Chest wall movement, moderate air movement bilaterally, expiratory wheezing RRR,No  Gallops,Rubs or new Murmurs,  +ve B.Sounds, Abd Soft, No tenderness,   3+ leg edema    Data Review:    CBC Recent Labs  Lab 09/01/22 1424 09/01/22 1442 09/01/22 1551 09/01/22 1806 09/01/22 2339 09/02/22 0419  WBC 5.5  --   --   --   --  6.5  HGB 13.8 13.9 14.6 14.6 13.3 11.1*  HCT 46.1* 41.0 43.0 43.0 39.0 37.3  PLT 187  --   --   --   --  186  MCV 105.7*  --   --   --   --  104.8*  MCH 31.7  --   --   --   --  31.2  MCHC 29.9*  --   --   --   --  29.8*   RDW 14.7  --   --   --   --  14.6  LYMPHSABS 0.6*  --   --   --   --   --   MONOABS 0.4  --   --   --   --   --   EOSABS 0.0  --   --   --   --   --   BASOSABS 0.0  --   --   --   --   --     Electrolytes Recent Labs  Lab 08/27/22 1344 08/27/22 1409 09/01/22 1424 09/01/22 1424 09/01/22 1426 09/01/22 1442 09/01/22 1551 09/01/22 1806 09/01/22 2339 09/02/22 0419  NA  --   --  141   < >  --  139 140 139 139 141  K  --   --  5.4*   < >  --  4.7 5.2* 4.3 3.9 4.2  MG  --   --   --   --   --   --   --   --   --  1.3*  CL  --   --  98  --   --   --   --   --   --  91*  CO2  --   --  35*  --   --   --   --   --   --  40*  GLUCOSE  --   --  120*  --   --   --   --   --   --  133*  BUN  --   --  8  --   --   --   --   --   --  9  CREATININE  --   --  0.71  --   --   --   --   --   --  0.74  CALCIUM  --   --  9.3  --   --   --   --   --   --  9.0  AST  --   --  28  --   --   --   --   --   --  21  ALT  --   --  15  --   --   --   --   --   --  12  ALKPHOS  --   --  50  --   --   --   --   --   --  41  BILITOT  --   --  0.4  --   --   --   --   --   --  0.4  ALBUMIN  --   --  3.9  --   --   --   --   --   --  3.2*  TSH  --  0.61  --   --  0.316*  --   --   --   --   --   HGBA1C 5.9*  --   --   --   --   --   --   --   --   --   BNP  --   --   --   --  16.7  --   --   --   --   --    < > = values in this interval not displayed.    ------------------------------------------------------------------------------------------------------------------ No results for input(s): "CHOL", "HDL", "LDLCALC", "TRIG", "CHOLHDL", "LDLDIRECT" in the last 72 hours.  Lab Results  Component Value Date   HGBA1C 5.9 (A) 08/27/2022    Recent Labs    09/01/22 1426  TSH 0.316*     Radiology Reports CT HEAD WO CONTRAST (5MM)  Result Date: 09/01/2022 CLINICAL DATA:  Altered mental status. EXAM: CT HEAD WITHOUT CONTRAST TECHNIQUE: Contiguous axial images were obtained from the base of the skull  through the vertex without intravenous contrast. RADIATION DOSE REDUCTION: This exam was performed according to the departmental dose-optimization program which includes automated exposure control, adjustment of the mA and/or kV according to patient size and/or use of iterative reconstruction technique. COMPARISON:  August 14, 2019. FINDINGS: Brain: No evidence of acute infarction, hemorrhage, hydrocephalus, extra-axial collection or mass lesion/mass effect. Vascular: No hyperdense vessel or unexpected calcification. Skull: Normal. Negative for fracture or focal lesion. Sinuses/Orbits: No acute finding. Other: None. IMPRESSION: No acute intracranial abnormality seen. Electronically Signed   By: Marijo Conception M.D.   On: 09/01/2022 15:23   DG Chest Portable 1 View  Result Date: 09/01/2022 CLINICAL DATA:  Frothy sputum and wheezing EXAM: PORTABLE CHEST 1 VIEW COMPARISON:  07/09/2022 FINDINGS: Subtle airspace density in the RIGHT upper lobe is improved from comparison exam. Small LEFT effusion is similar. No focal consolidation. No pneumothorax. Atherosclerotic calcification of the aorta. IMPRESSION: 1. Persistent nodular airspace disease in the RIGHT upper lobe. 2. Persistent small LEFT effusion. 3. No progressive finding from comparison exam 07/18/2022. Electronically Signed   By: Suzy Bouchard M.D.   On: 09/01/2022 14:59      Signature  Lala Lund M.D on 09/02/2022 at 8:27 AM   -  To page go to [email or web address]

## 2022-09-02 NOTE — Consult Note (Signed)
NAME:  Jasmine [name], MRN:  [id number], DOB:  Nov 01, 1945, LOS: 1 ADMISSION DATE:  09/01/2022, CONSULTATION DATE: 07/03/2022 REFERRING MD: [facility], CHIEF COMPLAINT: Altered mental status  History of Present Illness:  76 year old female who is followed by Dr. Elsworth Soho of the pulmonary practice, quit smoking 6 years ago, severe COPD as indicated by FEV1 of 30%.  She is found poorly responsive transported to the emergency room where she was placed on noninvasive.  Subsequently she became awake alert no acute distress not sure if the BiPAP is the cause of her getting better.  Her PCO2 was noted to be 60 with a pH of 7.48 she has compensated her CO2.  She does have a history of polysubstance abuse.  A UDS will be done for completeness.  No indication of infectious process at this time.  She is not wheezing therefore will not give further steroids.  Pulmonary critical care will continue to monitor with you  Pertinent  Medical History   Past Medical History:  Diagnosis Date   ALLERGIC RHINITIS 05/01/2007   Anxiety    Arthritis    "hands" (12/28/2015)   ASTHMA 05/01/2007   "since I was a child"   Cancer ([place])    LUNG   CHF (congestive heart failure) ([place])    Chronic bronchitis ([facility])    COPD (chronic obstructive pulmonary disease) ([place])    DEPRESSION 07/21/2008   DVT (deep venous thrombosis) ([place]) 1960s   LLE   FATIGUE 10/10/2010   HYPERTENSION 05/01/2007   HYPOTHYROIDISM 08/23/2009   s/p RAI   LUMBAR RADICULOPATHY, RIGHT 08/25/2008   On home oxygen therapy    "3-4L; qd; all the time" (12/28/2015)   OSTEOPOROSIS 05/01/2007   SHOULDER PAIN, LEFT 08/23/2009   SINUSITIS, CHRONIC 10/10/2010     Significant Hospital Events: Including procedures, antibiotic start and stop dates in addition to other pertinent events   Admitted with somnolence now awake and alert  Interim History / Subjective:  Awake alert no acute distress  Objective   Blood pressure 136/67, pulse 76, temperature 98.3 F (36.8 C),  resp. rate (!) 21, height 5' (1.524 m), weight 54.9 kg, SpO2 93 %.    FiO2 (%):  [40 %-50 %] 40 %  No intake or output data in the 24 hours ending 09/02/22 1014 Filed Weights   09/01/22 1435  Weight: 54.9 kg    Examination: General: Awake alert talkative HENT: No JVD or lymphadenopathy is appreciated, poor dentition Lungs: Decreased air movement without wheezes or rhonchi Cardiovascular: Heart sounds are distant Abdomen: Soft nontender Extremities: 3+ edema Neuro: Grossly intact without focal defect GU: Voids  Resolved Hospital Problem list     Assessment & Plan:  Altered mental status with reports that she was nonresponsive to husband.  She was placed on noninvasive mechanical ventilatory support with improvement in her mental status.  Questionable if this is from elevated PCO2 for she is got a pH of 7.48 with a PCO2 of 60 which is very well compensated.  She does have severe COPD with FEV1 of 30%.  She is O2 dependent and is frail. Would not give any further steroids O2 as needed Change BiPAP to nocturnal and as needed Noted to have a negative CT of the head. Bronchodilators as needed No indications for treating infection Consider palliative care consult  History of substance abuse Check drug screen for completeness  History of hyperthyroidism Per primary    Best Practice (right click and "Reselect all SmartList Selections" daily)  Diet/type: Regular consistency (see orders) DVT prophylaxis: not indicated GI prophylaxis: PPI Lines: N/A Foley:  N/A Code Status:  full code Last date of multidisciplinary goals of care discussion [tbd]  Labs   CBC: Recent Labs  Lab 09/01/22 1424 09/01/22 1442 09/01/22 1551 09/01/22 1806 09/01/22 2339 09/02/22 0419  WBC 5.5  --   --   --   --  6.5  NEUTROABS 4.4  --   --   --   --   --   HGB 13.8 13.9 14.6 14.6 13.3 11.1*  HCT 46.1* 41.0 43.0 43.0 39.0 37.3  MCV 105.7*  --   --   --   --  104.8*  PLT 187  --   --   --    --  671    Basic Metabolic Panel: Recent Labs  Lab 09/01/22 1424 09/01/22 1442 09/01/22 1551 09/01/22 1806 09/01/22 2339 09/02/22 0419  NA 141 139 140 139 139 141  K 5.4* 4.7 5.2* 4.3 3.9 4.2  CL 98  --   --   --   --  91*  CO2 35*  --   --   --   --  40*  GLUCOSE 120*  --   --   --   --  133*  BUN 8  --   --   --   --  9  CREATININE 0.71  --   --   --   --  0.74  CALCIUM 9.3  --   --   --   --  9.0  MG  --   --   --   --   --  1.3*  PHOS  --   --   --   --   --  2.7   GFR: Estimated Creatinine Clearance: 45.8 mL/min (by C-G formula based on SCr of 0.74 mg/dL). Recent Labs  Lab 09/01/22 1424 09/02/22 0419  WBC 5.5 6.5    Liver Function Tests: Recent Labs  Lab 09/01/22 1424 09/02/22 0419  AST 28 21  ALT 15 12  ALKPHOS 50 41  BILITOT 0.4 0.4  PROT 7.0 6.2*  ALBUMIN 3.9 3.2*   Recent Labs  Lab 09/01/22 1424  LIPASE 30   No results for input(s): "AMMONIA" in the last 168 hours.  ABG    Component Value Date/Time   PHART 7.480 (H) 09/01/2022 2339   PCO2ART 60.6 (H) 09/01/2022 2339   PO2ART 94 09/01/2022 2339   HCO3 45.3 (H) 09/01/2022 2339   TCO2 47 (H) 09/01/2022 2339   O2SAT 98 09/01/2022 2339     Coagulation Profile: No results for input(s): "INR", "PROTIME" in the last 168 hours.  Cardiac Enzymes: No results for input(s): "CKTOTAL", "CKMB", "CKMBINDEX", "TROPONINI" in the last 168 hours.  HbA1C: Hemoglobin A1C  Date/Time Value Ref Range Status  08/27/2022 01:44 PM 5.9 (A) 4.0 - 5.6 % Final  02/25/2022 03:32 PM 6.5 (A) 4.0 - 5.6 % Final   Hgb A1c MFr Bld  Date/Time Value Ref Range Status  04/19/2021 03:51 PM 6.3 4.6 - 6.5 % Final    Comment:    Glycemic Control Guidelines for People with Diabetes:Non Diabetic:  <6%Goal of Therapy: <7%Additional Action Suggested:  >8%   06/08/2020 03:55 PM 6.6 (H) <5.7 % of total Hgb Final    Comment:    For someone without known diabetes, a hemoglobin A1c value of 6.5% or greater indicates that they may  have  diabetes and this should be confirmed  with a follow-up  test. . For someone with known diabetes, a value <7% indicates  that their diabetes is well controlled and a value  greater than or equal to 7% indicates suboptimal  control. A1c targets should be individualized based on  duration of diabetes, age, comorbid conditions, and  other considerations. . Currently, no consensus exists regarding use of hemoglobin A1c for diagnosis of diabetes for children. .     CBG: Recent Labs  Lab 09/01/22 1458 09/01/22 2112 09/01/22 2354 09/02/22 0529 09/02/22 0826  GLUCAP 115* 143* 146* 112* 120*    Review of Systems:   10 point review of system taken, please see HPI for positives and negatives.   Past Medical History:  She,  has a past medical history of ALLERGIC RHINITIS (05/01/2007), Anxiety, Arthritis, ASTHMA (05/01/2007), Cancer ([place]), CHF (congestive heart failure) ([place]), Chronic bronchitis ([place]), COPD (chronic obstructive pulmonary disease) ([place]), DEPRESSION (07/21/2008), DVT (deep venous thrombosis) ([place]) (1960s), FATIGUE (10/10/2010), HYPERTENSION (05/01/2007), HYPOTHYROIDISM (08/23/2009), LUMBAR RADICULOPATHY, RIGHT (08/25/2008), On home oxygen therapy, OSTEOPOROSIS (05/01/2007), SHOULDER PAIN, LEFT (08/23/2009), and SINUSITIS, CHRONIC (10/10/2010).   Surgical History:   Past Surgical History:  Procedure Laterality Date   APPENDECTOMY     DILATION AND CURETTAGE OF UTERUS  multiple   history of multiple dialations and curettages and miscarriages, unfortunately never carrying a child to term   ECTOPIC PREGNANCY SURGERY  "late '60s or early '70s   ELECTROCARDIOGRAM  06/20/2006   OOPHORECTOMY Right      Social History:   reports that she quit smoking about 6 years ago. Her smoking use included cigarettes. She started smoking about 58 years ago. She has a 52.00 pack-year smoking history. She has never used smokeless tobacco. She reports that she does not currently use alcohol. She  reports that she does not currently use drugs.   Family History:  Her family history includes Alcohol abuse in her brother; Asthma in an other family member; Diabetes in her brother; Hypertension in an other family member; Lung cancer in her father; Stroke in her mother; Thyroid disease in her mother. There is no history of Breast cancer.   Allergies Allergies  Allergen Reactions   Alendronate Sodium Other (See Comments)    Patient does not remember this reaction     Home Medications  Prior to Admission medications   Medication Sig Start Date End Date Taking? Authorizing Provider  albuterol (VENTOLIN HFA) 108 (90 Base) MCG/ACT inhaler INHALE 1 TO 2 PUFFS INTO THE LUNGS EVERY 6 HOURS AS NEEDED FOR WHEEZING OR SHORTNESS OF BREATH Patient taking differently: Inhale 1-2 puffs into the lungs every 6 (six) hours as needed for wheezing or shortness of breath. 08/19/22  Yes Biagio Borg, MD  aspirin EC 81 MG EC tablet Take 1 tablet (81 mg total) by mouth daily. 03/27/16  Yes Velvet Bathe, MD  atorvastatin (LIPITOR) 10 MG tablet TAKE 1 TABLET (10 MG TOTAL) BY MOUTH DAILY. Patient taking differently: Take 10 mg by mouth daily. 08/19/22  Yes Biagio Borg, MD  citalopram (CELEXA) 20 MG tablet TAKE ONE TABLET BY MOUTH DAILY Patient taking differently: Take 20 mg by mouth daily. 07/23/22  Yes Biagio Borg, MD  fluticasone [facility]) 50 MCG/ACT nasal spray Place 1 spray into both nostrils daily. Patient taking differently: Place 1 spray into both nostrils daily as needed for allergies. 05/02/22  Yes Georgette Shell, MD  Fluticasone-Umeclidin-Vilant (TRELEGY ELLIPTA) 200-62.5-25 MCG/ACT AEPB INHALE 1 PUFF INTO THE LUNGS DAILY. Patient taking differently: Inhale 1  puff into the lungs daily as needed (for shortness of breath). 05/21/22  Yes Biagio Borg, MD  furosemide (LASIX) 20 MG tablet Take 1 tablet (20 mg total) by mouth daily. 05/01/22 05/01/23 Yes Georgette Shell, MD  ipratropium-albuterol  (DUONEB) 0.5-2.5 (3) MG/3ML SOLN Take 3 mLs by nebulization every 6 (six) hours as needed. Patient taking differently: Take 3 mLs by nebulization every 6 (six) hours as needed (for shortness of breath). 11/05/21  Yes Rigoberto Noel, MD  levothyroxine (SYNTHROID) 50 MCG tablet Take 1 tablet (50 mcg total) by mouth daily. 08/29/22  Yes Shamleffer, Melanie Crazier, MD  loratadine (CLARITIN) 10 MG tablet Take 1 tablet (10 mg total) by mouth daily. Patient taking differently: Take 10 mg by mouth daily as needed for allergies. 05/02/22  Yes Georgette Shell, MD  montelukast (SINGULAIR) 10 MG tablet Take 1 tablet (10 mg total) by mouth at bedtime. 07/21/22  Yes Danford, Suann Larry, MD  potassium chloride (KLOR-CON M) 10 MEQ tablet Take 1 tablet (10 mEq total) by mouth daily. 06/30/22  Yes Biagio Borg, MD  acetaminophen (TYLENOL) 325 MG tablet Take 2 tablets (650 mg total) by mouth every 6 (six) hours as needed for mild pain (or Fever >/= 101). 05/01/22   Georgette Shell, MD  docusate sodium (COLACE) 100 MG capsule Take 1 capsule (100 mg total) by mouth 2 (two) times daily. Patient not taking: Reported on 09/02/2022 07/31/22   Biagio Borg, MD  OXYGEN Inhale 3 L into the lungs continuous. 24/7    [provider]     Critical care time: Ferol Luz Takumi Din ACNP Acute Care Nurse Practitioner [place] Please consult Amion 09/02/2022, 10:14 AM

## 2022-09-02 NOTE — Telephone Encounter (Signed)
I have attempted to contact patient 3 separate times with no success.

## 2022-09-03 DIAGNOSIS — J441 Chronic obstructive pulmonary disease with (acute) exacerbation: Secondary | ICD-10-CM | POA: Diagnosis not present

## 2022-09-03 DIAGNOSIS — I5032 Chronic diastolic (congestive) heart failure: Secondary | ICD-10-CM | POA: Diagnosis not present

## 2022-09-03 LAB — CBC WITH DIFFERENTIAL/PLATELET
Abs Immature Granulocytes: 0.06 10*3/uL (ref 0.00–0.07)
Basophils Absolute: 0 10*3/uL (ref 0.0–0.1)
Basophils Relative: 0 %
Eosinophils Absolute: 0.1 10*3/uL (ref 0.0–0.5)
Eosinophils Relative: 0 %
HCT: 39 % (ref 36.0–46.0)
Hemoglobin: 12 g/dL (ref 12.0–15.0)
Immature Granulocytes: 1 %
Lymphocytes Relative: 14 %
Lymphs Abs: 1.6 10*3/uL (ref 0.7–4.0)
MCH: 31.2 pg (ref 26.0–34.0)
MCHC: 30.8 g/dL (ref 30.0–36.0)
MCV: 101.3 fL — ABNORMAL HIGH (ref 80.0–100.0)
Monocytes Absolute: 1.2 10*3/uL — ABNORMAL HIGH (ref 0.1–1.0)
Monocytes Relative: 11 %
Neutro Abs: 8.4 10*3/uL — ABNORMAL HIGH (ref 1.7–7.7)
Neutrophils Relative %: 74 %
Platelets: 199 10*3/uL (ref 150–400)
RBC: 3.85 MIL/uL — ABNORMAL LOW (ref 3.87–5.11)
RDW: 14.8 % (ref 11.5–15.5)
WBC: 11.3 10*3/uL — ABNORMAL HIGH (ref 4.0–10.5)
nRBC: 0 % (ref 0.0–0.2)

## 2022-09-03 LAB — GLUCOSE, CAPILLARY
Glucose-Capillary: 105 mg/dL — ABNORMAL HIGH (ref 70–99)
Glucose-Capillary: 106 mg/dL — ABNORMAL HIGH (ref 70–99)
Glucose-Capillary: 113 mg/dL — ABNORMAL HIGH (ref 70–99)
Glucose-Capillary: 115 mg/dL — ABNORMAL HIGH (ref 70–99)
Glucose-Capillary: 126 mg/dL — ABNORMAL HIGH (ref 70–99)
Glucose-Capillary: 133 mg/dL — ABNORMAL HIGH (ref 70–99)
Glucose-Capillary: 233 mg/dL — ABNORMAL HIGH (ref 70–99)
Glucose-Capillary: 88 mg/dL (ref 70–99)

## 2022-09-03 LAB — BASIC METABOLIC PANEL
Anion gap: 10 (ref 5–15)
BUN: 16 mg/dL (ref 8–23)
CO2: 42 mmol/L — ABNORMAL HIGH (ref 22–32)
Calcium: 9 mg/dL (ref 8.9–10.3)
Chloride: 85 mmol/L — ABNORMAL LOW (ref 98–111)
Creatinine, Ser: 0.76 mg/dL (ref 0.44–1.00)
GFR, Estimated: >60 mL/min (ref >60)
Glucose, Bld: 105 mg/dL — ABNORMAL HIGH (ref 70–99)
Potassium: 3.5 mmol/L (ref 3.5–5.1)
Sodium: 137 mmol/L (ref 135–145)

## 2022-09-03 LAB — MAGNESIUM: Magnesium: 1.7 mg/dL (ref 1.7–2.4)

## 2022-09-03 LAB — PHOSPHORUS: Phosphorus: 3.7 mg/dL (ref 2.5–4.6)

## 2022-09-03 LAB — BRAIN NATRIURETIC PEPTIDE: B Natriuretic Peptide: 33.5 pg/mL (ref 0.0–100.0)

## 2022-09-03 MED ORDER — IPRATROPIUM-ALBUTEROL 0.5-2.5 (3) MG/3ML IN SOLN
3.0000 mL | Freq: Three times a day (TID) | RESPIRATORY_TRACT | Status: DC
  Administered 2022-09-03 – 2022-09-04 (×3): 3 mL via RESPIRATORY_TRACT
  Filled 2022-09-03 (×4): qty 3

## 2022-09-03 MED ORDER — ENSURE ENLIVE PO LIQD
237.0000 mL | Freq: Two times a day (BID) | ORAL | Status: DC
  Administered 2022-09-04 (×2): 237 mL via ORAL

## 2022-09-03 MED ORDER — ACETAZOLAMIDE 250 MG PO TABS
250.0000 mg | ORAL_TABLET | Freq: Two times a day (BID) | ORAL | Status: DC
  Administered 2022-09-03 – 2022-09-04 (×3): 250 mg via ORAL
  Filled 2022-09-03 (×4): qty 1

## 2022-09-03 MED ORDER — ADULT MULTIVITAMIN W/MINERALS CH
1.0000 | ORAL_TABLET | Freq: Every day | ORAL | Status: DC
  Administered 2022-09-03 – 2022-09-04 (×2): 1 via ORAL
  Filled 2022-09-03 (×2): qty 1

## 2022-09-03 NOTE — Progress Notes (Signed)
Initial Nutrition Assessment  DOCUMENTATION CODES:   Not applicable  INTERVENTION:   Multivitamin w/ minerals daily Ensure Enlive po BID, each supplement provides 350 kcal and 20 grams of protein. Encourage good PO intake   NUTRITION DIAGNOSIS:   Increased nutrient needs related to chronic illness as evidenced by estimated needs.  GOAL:   Patient will meet greater than or equal to 90% of their needs  MONITOR:   Supplement acceptance, PO intake, Labs  REASON FOR ASSESSMENT:   Consult Assessment of nutrition requirement/status  ASSESSMENT:   76 y.o. female presented to the ED with SOB and confusion. PMH includes COPD, HTN, CHF, T2DM, and malnutrition. Pt admitted with COPD exacerbation.   RD working remotely at time of assessment.  Discussed case with RN, pt doing well. Possible discharge soon.  No meal intake have been recorded. Per EMR, pt weight has remained stable.   Medications reviewed and include: Diamox, Zithromax, NovoLog SSI, Solu-medrol Labs reviewed: 24 hr CBGs 88-233  NUTRITION - FOCUSED PHYSICAL EXAM:  Deferred to follow-up.   Diet Order:   Diet Order             Diet Heart Room service appropriate? Yes; Fluid consistency: Thin; Fluid restriction: 1500 mL Fluid  Diet effective now                   EDUCATION NEEDS:   No education needs have been identified at this time  Skin:  Skin Assessment: Reviewed RN Assessment  Last BM:  11/13  Height:   Ht Readings from Last 1 Encounters:  09/01/22 5' (1.524 m)    Weight:   Wt Readings from Last 1 Encounters:  09/01/22 54.9 kg    Ideal Body Weight:  45.5 kg  BMI:  Body mass index is 23.63 kg/m.  Estimated Nutritional Needs:   Kcal:  1600-1800  Protein:  80-95 grams  Fluid:  >/= 1.6 L    Hermina Barters RD, LDN Clinical Dietitian See [facility] for contact information.

## 2022-09-03 NOTE — Evaluation (Signed)
Occupational Therapy Evaluation Patient Details Name: Jasmine [name] MRN: [id number] DOB: [birth year] Today's Date: 09/03/2022   History of Present Illness 76 yo female with onset of COPD flare up was admitted on 11/13 for SOB, AMS, poor responsiveness and with elevation of CO2.  Pt became more alert with bipap potentially helping to clear CO2.  PMHx:  anxiety, asthma, lung CA, depression, DVT, bronchitis, lumbar radiculopathy, osteoporosis, L shoulder pain, HTN, CHF, chronic bronchitis   Clinical Impression   Patient admitted for the diagnosis above.  PTA she lives with her spouse, and has nieces in the area that can assist if needed.  She is known to this OT, and appears to be functioning at her baseline. RN in to remove all cardiac leads, and currently not on an IV.  Patient demonstrating improved safety and needing no assist with ADL or in room mobility.  No significant OT needs exist in the acute setting, and no post acute OT is anticipated.         Recommendations for follow up therapy are one component of a multi-disciplinary discharge planning process, led by the attending physician.  Recommendations may be updated based on patient status, additional functional criteria and insurance authorization.   Follow Up Recommendations  No OT follow up     Assistance Recommended at Discharge Intermittent Supervision/Assistance  Patient can return home with the following Assist for transportation;Assistance with cooking/housework    Functional Status Assessment  Patient has not had a recent decline in their functional status  Equipment Recommendations  None recommended by OT    Recommendations for Other Services       Precautions / Restrictions Precautions Precautions: Fall Restrictions Weight Bearing Restrictions: No      Mobility Bed Mobility Overal bed mobility: Modified Independent                  Transfers Overall transfer level: Needs assistance Equipment used:  Rolling walker (2 wheels) Transfers: Sit to/from Stand, Bed to chair/wheelchair/BSC Sit to Stand: Modified independent (Device/Increase time)     Step pivot transfers: Supervision            Balance Overall balance assessment: Mild deficits observed, not formally tested                                         ADL either performed or assessed with clinical judgement   ADL Overall ADL's : At baseline                                             Vision Patient Visual Report: No change from baseline       Perception     Praxis      Pertinent Vitals/Pain Pain Assessment Pain Assessment: No/denies pain     Hand Dominance Right   Extremity/Trunk Assessment Upper Extremity Assessment Upper Extremity Assessment: Overall WFL for tasks assessed   Lower Extremity Assessment Lower Extremity Assessment: Defer to PT evaluation   Cervical / Trunk Assessment Cervical / Trunk Assessment: Kyphotic   Communication Communication Communication: No difficulties   Cognition Arousal/Alertness: Awake/alert Behavior During Therapy: WFL for tasks assessed/performed Overall Cognitive Status: Within Functional Limits for tasks assessed  General Comments   VSS on O2    Exercises     Shoulder Instructions      Home Living Family/patient expects to be discharged to:: Private residence Living Arrangements: Spouse/significant other Available Help at Discharge: Family;Available 24 hours/day Type of Home: House Home Access: Ramped entrance     Home Layout: One level     Bathroom Shower/Tub: Occupational psychologist: Handicapped height Bathroom Accessibility: Yes How Accessible: Accessible via walker Home Equipment: Rollator (4 wheels);Shower seat;Grab bars - tub/shower;BSC/3in1;Wheelchair - Publishing copy (2 wheels)          Prior Functioning/Environment Prior Level  of Function : Independent/Modified Independent             Mobility Comments: rollator outside only ADLs Comments: pt taking seated rest breaks to complete self care tasks and light housework, spouse assisting with housework        OT Problem List: Decreased activity tolerance      OT Treatment/Interventions:      OT Goals(Current goals can be found in the care plan section) Acute Rehab OT Goals Patient Stated Goal: Hoping to return home tomorrow OT Goal Formulation: With patient Time For Goal Achievement: 09/08/22 Potential to Achieve Goals: Good  OT Frequency:      Co-evaluation              AM-PAC OT "6 Clicks" Daily Activity     Outcome Measure Help from another person eating meals?: None Help from another person taking care of personal grooming?: None Help from another person toileting, which includes using toliet, bedpan, or urinal?: None Help from another person bathing (including washing, rinsing, drying)?: A Little Help from another person to put on and taking off regular upper body clothing?: None Help from another person to put on and taking off regular lower body clothing?: A Little 6 Click Score: 22   End of Session Equipment Utilized During Treatment: Rolling walker (2 wheels);Oxygen Nurse Communication: Mobility status  Activity Tolerance: Patient tolerated treatment well Patient left: in chair;with call bell/phone within reach  OT Visit Diagnosis: Unsteadiness on feet (R26.81)                Time: 9935-7017 OT Time Calculation (min): 22 min Charges:  OT General Charges $OT Visit: 1 Visit OT Evaluation $OT Eval Moderate Complexity: 1 Mod  09/03/2022  RP, OTR/L  Acute Rehabilitation Services  Office:  [phone]   Metta Clines 09/03/2022, 11:45 AM

## 2022-09-03 NOTE — Progress Notes (Signed)
Transition of Care Department [facility]) following patient for high risk of readmission.   This patient is currently enrolled in [facility] outpatient-based Palliative Care. Patient has home 02 with [employer].   Patient admitted for Acute metabolic encephalopathy due to hypercapnia and hypoxia.  Transition of Care Department [facility] has reviewed patient and we will continue to monitor patient advancement through interdisciplinary progression rounds.

## 2022-09-03 NOTE — TOC Initial Note (Signed)
Transition of Care [facility]) - Initial/Assessment Note    Patient Details  Name: Jasmine [name] MRN: [id number] Date of Birth: [birth year]  [facility] CM/SW Contact:    Cyndi Bender, RN Phone Number: 09/03/2022, 11:59 AM  Clinical Narrative:                 Spoke to patient regarding transition needs. Patient declines SNF recommendations and would like to go home with home health. Patient states she would like to use [facility].  Amy with [facility] accepted referral.  Patient states her husband,  Jasmine [name], can bring portable tank when discharged.  Patient has home 02, NIV and walker at home.  Address, Phone number and PCP verified.  TOC will continue to follow for needs.   Expected Discharge Plan: [place] Barriers to Discharge: Continued Medical Work up   Patient Goals and CMS Choice Patient states their goals for this hospitalization and ongoing recovery are:: return home CMS Medicare.gov Compare Post Acute Care list provided to:: Patient Choice offered to / list presented to : Patient  Expected Discharge Plan and Services Expected Discharge Plan: [place]   Discharge Planning Services: CM Consult Post Acute Care Choice: [place] arrangements for the past 2 months: Single Family Home                           HH Arranged: PT [place]: [place] Date [place]: 09/03/22 Time HH Agency Contacted: [id number] Representative spoke with at [place]: Amy  Prior Living Arrangements/Services Living arrangements for the past 2 months: [place] with:: Spouse Patient language and need for interpreter reviewed:: Yes Do you feel safe going back to the place where you live?: Yes      Need for Family Participation in Patient Care: Yes (Comment) Care giver support system in place?: Yes (comment) Current home services: DME (home 02, walker, NIV) Criminal Activity/Legal Involvement  Pertinent to Current Situation/Hospitalization: No - Comment as needed  Activities of Daily Living Home Assistive Devices/Equipment: Walker (specify type) (front wheel) ADL Screening (condition at time of admission) Patient's cognitive ability adequate to safely complete daily activities?: Yes Is the patient deaf or have difficulty hearing?: No Does the patient have difficulty seeing, even when wearing glasses/contacts?: No Does the patient have difficulty concentrating, remembering, or making decisions?: No Patient able to express need for assistance with ADLs?: Yes Does the patient have difficulty dressing or bathing?: No Independently performs ADLs?: Yes (appropriate for developmental age) Does the patient have difficulty walking or climbing stairs?: No Weakness of Legs: None Weakness of Arms/Hands: None  Permission Sought/Granted         Permission granted to share info w AGENCY: [facility]        Emotional Assessment   Attitude/Demeanor/Rapport: Engaged Affect (typically observed): Accepting Orientation: : Oriented to Self, Oriented to Place, Oriented to  Time, Oriented to Situation Alcohol / Substance Use: Not Applicable Psych Involvement: No (comment)  Admission diagnosis:  COPD with acute exacerbation ([place]) [J44.1] Acute hypercapnic respiratory failure ([place]) [J96.02] Patient Active Problem List   Diagnosis Date Noted   Acute metabolic encephalopathy 28/36/6294   Controlled type 2 diabetes mellitus without complication, without long-term current use of insulin ([place]) 08/27/2022   Hyperkalemia 07/21/2022   Hyperlipidemia 07/21/2022   Vitamin D deficiency 03/01/2022   Physical deconditioning 12/05/2021   COPD exacerbation ([place]) 07/30/2021   Diabetes  mellitus due to underlying condition without complication, without long-term current use of insulin ([place]) 07/22/2021   Steroid-dependent chronic obstructive pulmonary disease ([place]) 07/22/2021   Postablative hypothyroidism  07/22/2021   Encounter for well adult exam with abnormal findings 04/20/2021   Malnutrition of moderate degree 08/16/2019   Respiratory acidosis 03/07/2019   Goals of care, counseling/discussion 09/20/2018   Edema due to congestive heart failure ([place]) 07/27/2018   Palliative care by specialist    Shortness of breath    Chronic low back pain 04/30/2018   COPD with acute exacerbation ([place])    Hypothyroidism    Anxiety    Respiratory failure with hypercapnia ([place]) 03/24/2018   Hypercarbia 03/22/2018   Polysubstance abuse ([place]) 01/26/2018   Aspiration into airway 06/03/2017   Rash 07/12/2016   Bilateral hearing loss 06/05/2016   Hyperglycemia 05/21/2016   Lung nodules 03/24/2016   Abnormal LFTs 03/24/2016   Solitary pulmonary nodule 01/09/2016   Acute on chronic respiratory failure with hypoxia (HCC)    On home oxygen therapy    Chronic diastolic congestive heart failure ([place])    Bacteremia Step viridans 12/27/2015   Acute on chronic respiratory failure with hypoxia and hypercapnia ([place]) 12/25/2015   Former smoker 12/25/2015   Hypothyroidism following radioiodine therapy 09/26/2014   Protein-calorie malnutrition, severe ([place]) 06/23/2014   Cor pulmonale ([place]) 01/12/2013   COPD (chronic obstructive pulmonary disease) ([place]) 11/29/2011   Macrocytosis without anemia 11/09/2011   Palliative care encounter 04/09/2011   SINUSITIS, CHRONIC 10/10/2010   Fatigue 10/10/2010   Depression with anxiety 07/21/2008   Hyperthyroidism 11/23/2007   Essential hypertension 05/01/2007   ALLERGIC RHINITIS 05/01/2007   Asthma 05/01/2007   OSTEOPOROSIS 05/01/2007   PCP:  Biagio Borg, MD Pharmacy:   [facility], [place] [address] Phone: [phone] Fax: [phone]  [facility] [address] [place] [address] Phone: [phone] Fax: [place] [address] Phone: [phone] Fax: [phone]  [facility] [place], [place] [address] Phone: [phone] Fax: [phone]     Social Determinants of Health (SDOH) Interventions    Readmission Risk Interventions    03/02/2020    3:30 PM  Readmission Risk Prevention Plan  Transportation Screening Complete  HRI or Home Care Consult Complete  Palliative Care Screening Not Applicable  Medication Review (RN Care Manager) Referral to Pharmacy

## 2022-09-03 NOTE — Progress Notes (Signed)
PROGRESS NOTE                                                                                                                                                                                                             Patient Demographics:    Jasmine [name], is a 76 y.o. female, DOB - 22-Apr-1946, TTS:[id number]  Outpatient Primary MD for the patient is Biagio Borg, MD    LOS - 2  Admit date - 09/01/2022    Chief Complaint  Patient presents with   Altered Mental Status       Brief Narrative (HPI from H&P)     76 y.o. female with medical history significant of COPD on 4L o2, DM2, Diastolic CHF, chronic hypoxic and hypercarbic respiratory failure on oxygen, remote DVT in 1960's, HTN, hypothyroidism, bilateral pulmonary nodules she presented with shortness of breath and confusion.  She was diagnosed with acute on chronic hypoxic and hypercapnic respiratory failure due to COPD exacerbation and diastolic CHF and admitted to the hospital.  Initially required BiPAP.    Subjective:    Jasmine [name] today ports she is feeling better, report dyspnea still present but has improved,     Assessment  & Plan :   Acute on chronic hypoxic and hypercapnic respiratory failure due to COPD exacerbation and acute on chronic diastolic CHF.  - She has underlying history of COPD and is on 4 L of nasal cannula oxygen at baseline, she did have hypercapnia and hypoxia upon admission, she was initially treated with BiPAP with good results.  Hypercapnia resolved and mentation is back to baseline.  Continue IV steroids, continue antibiotic we will switch Rocephin to azithromycin for COPD exacerbation.  Currently on 4 L nasal cannula oxygen and close to her baseline which will be continued. -She was asked to bring her home machine BiPAP so she continue to use it.   Contraction alkalosis  - bicarb is significantly elevated at 42, will give 2 doses of  acetazolamide, will hold on IV Lasix today, likely can resume tomorrow on oral Lasix  Acute metabolic encephalopathy due to hypercapnia and hypoxia.  Resolved.  No headache, no focal deficits, CT head stable.  Acute on chronic diastolic CHF EF 09% on echocardiogram done few months ago.  She is definitely has orthopnea and  peripheral edema along with few crackles,  significant improvement with IV Lasix, I will hold today secondary to elevated bicarb level.    HX of pulmonary nodules.  Request PCP to arrange for outpatient pulmonary follow-up.  Will need repeat CT chest in 3 months.  Hypothyroidism.  TSH somewhat suppressed, hold Synthroid for the next 3 to 4 days then resume at a lower dose with outpatient TSH monitoring in 4 weeks by PCP.  Essential hypertension.  Stable off of medications right now.  Dyslipidemia.  On Lipitor.  Hypomagnesemia.  Replaced.    DM type II.  Sliding scale.  Lab Results  Component Value Date   HGBA1C 5.9 (A) 08/27/2022   CBG (last 3)  Recent Labs    09/03/22 0756 09/03/22 0820 09/03/22 1234  GLUCAP 113* 106* 233*         Condition - Extremely Guarded  Family Communication  :    Code Status :  Full  Consults  :  PCCM  PUD Prophylaxis :     Procedures  :     CT head.  Nonacute.   TTE - 04/2022   1. Left ventricular ejection fraction, by estimation, is 70 to 75%. The left ventricle has hyperdynamic function. The left ventricle has no regional wall motion abnormalities. Left ventricular diastolic parameters are consistent with Grade I diastolic dysfunction (impaired relaxation).   2. Right ventricular systolic function is normal. The right ventricular size is normal.   3. The mitral valve is normal in structure. No evidence of mitral valve regurgitation. No evidence of mitral stenosis.   4. The aortic valve is normal in structure. Aortic valve regurgitation is not visualized. No aortic stenosis is present.   5. The inferior vena cava is  normal in size with <50% respiratory variability, suggesting right atrial pressure of 8 mmHg.       Disposition Plan  :    Status is: Inpatient  DVT Prophylaxis  :    Place TED hose Start: 09/02/22 0824 SCDs Start: 09/01/22 1934    Lab Results  Component Value Date   PLT 199 09/03/2022    Diet :  Diet Order             Diet Heart Room service appropriate? Yes; Fluid consistency: Thin; Fluid restriction: 1500 mL Fluid  Diet effective now                    Inpatient Medications  Scheduled Meds:  acetaZOLAMIDE  250 mg Oral BID   aspirin EC  81 mg Oral Daily   atorvastatin  10 mg Oral Daily   azithromycin  500 mg Oral Daily   insulin aspart  0-9 Units Subcutaneous Q4H   ipratropium-albuterol  3 mL Nebulization TID   methylPREDNISolone (SOLU-MEDROL) injection  40 mg Intravenous Q24H   sodium chloride flush  3 mL Intravenous Q12H   Continuous Infusions:  sodium chloride     PRN Meds:.sodium chloride, acetaminophen **OR** acetaminophen, HYDROcodone-acetaminophen, loratadine, sodium chloride flush     Objective:   Vitals:   09/02/22 2337 09/03/22 0428 09/03/22 0800 09/03/22 1240  BP: 132/66 104/72 119/71 125/66  Pulse: 95 86 90 86  Resp: '15 18 18 18  '$ Temp: 98.1 F (36.7 C) 97.8 F (36.6 C) 98.2 F (36.8 C) 97.9 F (36.6 C)  TempSrc: Oral Oral Oral Oral  SpO2: 92% 96% 100% 95%  Weight:      Height:        Wt Readings from Last 3 Encounters:  09/01/22 54.9 kg  08/27/22 54.9 kg  08/15/22 55.5 kg     Intake/Output Summary (Last 24 hours) at 09/03/2022 1401 Last data filed at 09/02/2022 1500 Gross per 24 hour  Intake 360 ml  Output --  Net 360 ml     Physical Exam  Awake Alert, Oriented X 3, No new F.N deficits, Normal affect Symmetrical Chest wall movement, proved air entry bilaterally, with minimal wheezing RRR,No Gallops,Rubs or new Murmurs, No Parasternal Heave +ve B.Sounds, Abd Soft, No tenderness, No rebound - guarding or  rigidity. No Cyanosis, Clubbing ,+2  edema, No new Rash or bruise    Data Review:    CBC Recent Labs  Lab 09/01/22 1424 09/01/22 1442 09/01/22 1551 09/01/22 1806 09/01/22 2339 09/02/22 0419 09/03/22 0354  WBC 5.5  --   --   --   --  6.5 11.3*  HGB 13.8   < > 14.6 14.6 13.3 11.1* 12.0  HCT 46.1*   < > 43.0 43.0 39.0 37.3 39.0  PLT 187  --   --   --   --  186 199  MCV 105.7*  --   --   --   --  104.8* 101.3*  MCH 31.7  --   --   --   --  31.2 31.2  MCHC 29.9*  --   --   --   --  29.8* 30.8  RDW 14.7  --   --   --   --  14.6 14.8  LYMPHSABS 0.6*  --   --   --   --   --  1.6  MONOABS 0.4  --   --   --   --   --  1.2*  EOSABS 0.0  --   --   --   --   --  0.1  BASOSABS 0.0  --   --   --   --   --  0.0   < > = values in this interval not displayed.    Electrolytes Recent Labs  Lab 08/27/22 1409 09/01/22 1424 09/01/22 1426 09/01/22 1442 09/01/22 1551 09/01/22 1806 09/01/22 2339 09/02/22 0419 09/03/22 0354  NA  --  141  --    < > 140 139 139 141 137  K  --  5.4*  --    < > 5.2* 4.3 3.9 4.2 3.5  MG  --   --   --   --   --   --   --  1.3* 1.7  CL  --  98  --   --   --   --   --  91* 85*  CO2  --  35*  --   --   --   --   --  40* 42*  GLUCOSE  --  120*  --   --   --   --   --  133* 105*  BUN  --  8  --   --   --   --   --  9 16  CREATININE  --  0.71  --   --   --   --   --  0.74 0.76  CALCIUM  --  9.3  --   --   --   --   --  9.0 9.0  AST  --  28  --   --   --   --   --  21  --   ALT  --  15  --   --   --   --   --  12  --   ALKPHOS  --  50  --   --   --   --   --  41  --   BILITOT  --  0.4  --   --   --   --   --  0.4  --   ALBUMIN  --  3.9  --   --   --   --   --  3.2*  --   TSH 0.61  --  0.316*  --   --   --   --   --   --   BNP  --   --  16.7  --   --   --   --   --  33.5   < > = values in this interval not displayed.    ------------------------------------------------------------------------------------------------------------------ No results for input(s):  "CHOL", "HDL", "LDLCALC", "TRIG", "CHOLHDL", "LDLDIRECT" in the last 72 hours.  Lab Results  Component Value Date   HGBA1C 5.9 (A) 08/27/2022    Recent Labs    09/01/22 1426  TSH 0.316*     Radiology Reports CT CHEST WO CONTRAST  Result Date: 09/02/2022 CLINICAL DATA:  Shortness of breath EXAM: CT CHEST WITHOUT CONTRAST TECHNIQUE: Multidetector CT imaging of the chest was performed following the standard protocol without IV contrast. RADIATION DOSE REDUCTION: This exam was performed according to the departmental dose-optimization program which includes automated exposure control, adjustment of the mA and/or kV according to patient size and/or use of iterative reconstruction technique. COMPARISON:  CT 04/27/2022, 11/24/2018 FINDINGS: Cardiovascular: Heart size within normal limits. No significant pericardial effusion. Thoracic aorta is nonaneurysmal. Scattered atherosclerotic vascular calcifications of the aorta and coronary arteries. Central pulmonary vasculature is within normal limits. Mediastinum/Nodes: No enlarged mediastinal or axillary lymph nodes. Thyroid gland, trachea, and esophagus demonstrate no significant findings. Lungs/Pleura: Small left and trace right pleural effusions with associated compressive atelectasis. Chronic scarring in the periphery of the right upper lobe (series 4, image 66), unchanged. Stable nodular opacity in the left upper lobe measuring up to 1.0 cm (series 4, image 49). Additional stable 3 mm peripheral left upper lobe nodule seen on the same slice. New 10 x 6 ovoid nodular density abutting the pleural surface at the left lung base (series 4, image 128) which could be a site of atelectasis. Background of mild emphysematous changes. No pneumothorax. Upper Abdomen: No acute abnormality. Musculoskeletal: No chest wall mass or suspicious bone lesions identified. Subtle superior endplate compression deformity at L1 is unchanged. IMPRESSION: 1. Small left and trace right  pleural effusions with associated compressive atelectasis. 2. New 10 x 6 mm ovoid nodular density abutting the pleural surface at the left lung base which could be a site of atelectasis. Consider one of the following in 3 months for both low-risk and high-risk individuals: (a) repeat chest CT, (b) follow-up PET-CT, or (c) tissue sampling. This recommendation follows the consensus statement: Guidelines for Management of Incidental Pulmonary Nodules Detected on CT Images: From the Fleischner Society 2017; Radiology 2017; 284:228-243. 3. Stable left upper lobe nodules measuring up to 1.0 cm. 4. Aortic and coronary artery atherosclerosis (ICD10-I70.0). Electronically Signed   By: Davina Poke D.O.   On: 09/02/2022 09:11   CT HEAD WO CONTRAST (5MM)  Result Date: 09/01/2022 CLINICAL DATA:  Altered mental status. EXAM: CT HEAD WITHOUT CONTRAST TECHNIQUE: Contiguous axial images were obtained from the base of the skull through the vertex without intravenous contrast. RADIATION DOSE REDUCTION: This exam was performed according  to the departmental dose-optimization program which includes automated exposure control, adjustment of the mA and/or kV according to patient size and/or use of iterative reconstruction technique. COMPARISON:  August 14, 2019. FINDINGS: Brain: No evidence of acute infarction, hemorrhage, hydrocephalus, extra-axial collection or mass lesion/mass effect. Vascular: No hyperdense vessel or unexpected calcification. Skull: Normal. Negative for fracture or focal lesion. Sinuses/Orbits: No acute finding. Other: None. IMPRESSION: No acute intracranial abnormality seen. Electronically Signed   By: Marijo Conception M.D.   On: 09/01/2022 15:23   DG Chest Portable 1 View  Result Date: 09/01/2022 CLINICAL DATA:  Frothy sputum and wheezing EXAM: PORTABLE CHEST 1 VIEW COMPARISON:  07/09/2022 FINDINGS: Subtle airspace density in the RIGHT upper lobe is improved from comparison exam. Small LEFT effusion is  similar. No focal consolidation. No pneumothorax. Atherosclerotic calcification of the aorta. IMPRESSION: 1. Persistent nodular airspace disease in the RIGHT upper lobe. 2. Persistent small LEFT effusion. 3. No progressive finding from comparison exam 07/18/2022. Electronically Signed   By: Suzy Bouchard M.D.   On: 09/01/2022 14:59      Signature  Phillips Climes M.D on 09/03/2022 at 2:01 PM   -  To page go to [email or web address]

## 2022-09-04 ENCOUNTER — Other Ambulatory Visit (HOSPITAL_COMMUNITY): Payer: Self-pay

## 2022-09-04 LAB — CBC WITH DIFFERENTIAL/PLATELET
Abs Immature Granulocytes: 0.04 10*3/uL (ref 0.00–0.07)
Basophils Absolute: 0.1 10*3/uL (ref 0.0–0.1)
Basophils Relative: 1 %
Eosinophils Absolute: 0.1 10*3/uL (ref 0.0–0.5)
Eosinophils Relative: 1 %
HCT: 41.9 % (ref 36.0–46.0)
Hemoglobin: 13.1 g/dL (ref 12.0–15.0)
Immature Granulocytes: 0 %
Lymphocytes Relative: 20 %
Lymphs Abs: 1.9 10*3/uL (ref 0.7–4.0)
MCH: 30.9 pg (ref 26.0–34.0)
MCHC: 31.3 g/dL (ref 30.0–36.0)
MCV: 98.8 fL (ref 80.0–100.0)
Monocytes Absolute: 1.1 10*3/uL — ABNORMAL HIGH (ref 0.1–1.0)
Monocytes Relative: 12 %
Neutro Abs: 6.2 10*3/uL (ref 1.7–7.7)
Neutrophils Relative %: 66 %
Platelets: 199 10*3/uL (ref 150–400)
RBC: 4.24 MIL/uL (ref 3.87–5.11)
RDW: 14.6 % (ref 11.5–15.5)
WBC: 9.3 10*3/uL (ref 4.0–10.5)
nRBC: 0 % (ref 0.0–0.2)

## 2022-09-04 LAB — BASIC METABOLIC PANEL
Anion gap: 8 (ref 5–15)
BUN: 26 mg/dL — ABNORMAL HIGH (ref 8–23)
CO2: 39 mmol/L — ABNORMAL HIGH (ref 22–32)
Calcium: 10 mg/dL (ref 8.9–10.3)
Chloride: 91 mmol/L — ABNORMAL LOW (ref 98–111)
Creatinine, Ser: 0.9 mg/dL (ref 0.44–1.00)
GFR, Estimated: >60 mL/min (ref >60)
Glucose, Bld: 91 mg/dL (ref 70–99)
Potassium: 3.5 mmol/L (ref 3.5–5.1)
Sodium: 138 mmol/L (ref 135–145)

## 2022-09-04 LAB — GLUCOSE, CAPILLARY
Glucose-Capillary: 127 mg/dL — ABNORMAL HIGH (ref 70–99)
Glucose-Capillary: 120 mg/dL — ABNORMAL HIGH (ref 70–99)
Glucose-Capillary: 169 mg/dL — ABNORMAL HIGH (ref 70–99)

## 2022-09-04 LAB — BRAIN NATRIURETIC PEPTIDE: B Natriuretic Peptide: 24.9 pg/mL (ref 0.0–100.0)

## 2022-09-04 LAB — MAGNESIUM: Magnesium: 2 mg/dL (ref 1.7–2.4)

## 2022-09-04 MED ORDER — PREDNISONE 10 MG (21) PO TBPK
ORAL_TABLET | ORAL | 0 refills | Status: DC
  Filled 2022-09-04: qty 21, 6d supply, fill #0

## 2022-09-04 MED ORDER — PREDNISONE 10 MG (21) PO TBPK: ORAL_TABLET | ORAL | 0 refills | Status: DC

## 2022-09-04 NOTE — Discharge Summary (Signed)
Physician Discharge Summary  Jasmine [name] HYW:[id number] DOB: [birth year] DOA: 09/01/2022  PCP: Biagio Borg, MD  Admit date: 09/01/2022 Discharge date: 09/04/2022  Admitted From: (Home) Disposition: SNF, so will discharge home with home health  Recommendations for Outpatient Follow-up:  Follow up with PCP in 2 weeks Please obtain BMP/CBC during next visit Patient to follow-up with pulmonary as an outpatient, ambulatory referral has been sent, she has an appointment scheduled with Dr. Elsworth Soho on 11/29, Will need repeat CT chest in 67-month please see discussion below  Home Health: (YES)   Discharge Condition: (Stable) CODE STATUS: (FULL) Diet recommendation: Heart Healthy  Brief/Interim Summary: 76y.o. female with medical history significant of COPD on 4L o2, DM2, Diastolic CHF, chronic hypoxic and hypercarbic respiratory failure on oxygen, remote DVT in 1960's, HTN, hypothyroidism, bilateral pulmonary nodules she presented with shortness of breath and confusion.  She was diagnosed with acute on chronic hypoxic and hypercapnic respiratory failure due to COPD exacerbation and diastolic CHF and admitted to the hospital.  Initially required BiPAP.    Acute on chronic hypoxic and hypercapnic respiratory failure due to COPD exacerbation and acute on chronic diastolic CHF.  - She has underlying history of COPD and is on 4 L of nasal cannula oxygen at baseline, she did have hypercapnia and hypoxia upon admission, she was initially treated with BiPAP with good results.  Hypercapnia resolved and mentation is back to baseline.  He was treated with IV steroids, fracture status back to baseline to continue on prednisone taper upon discharge, she was treated with Rocephin, azithromycin for COPD exacerbation, no need for further antibiotics on discharge -He did not require any further use of BiPAP over last 24 hours.     Acute metabolic encephalopathy due to hypercapnia and hypoxia.  Resolved.  No  headache, no focal deficits, CT head stable.   Acute on chronic diastolic CHF EF 748%on echocardiogram done few months ago.  -Initially with IV diuresis, volume status has improved, she is to be discharged on her home dose Lasix.  Contraction alkalosis  - bicarb  is up to 42, she received acetazolamide, bicarb is 39 at time of discharge,   HX of pulmonary nodules.  He has pulmonary follow-up on 11/19 per PCCM note, will need repeat CT chest in 3 months. -Discussed in details with the patient   Hypothyroidism.   - TSH somewhat suppressed, Synthroid held during hospital stay, it was resumed at time of discharge, will need repeat repeat TSH in 4 weeks .  Essential hypertension.  Stable, she is not taking any home medications for nowDyslipidemia.  On Lipitor.   Hypomagnesemia.  Replaced.     DM type II.  Sliding scale during hospital stay, but overall her CBG has been controlled without any insulin.  She is not on any home medications. Discharge Diagnoses:  Principal Problem:   COPD with acute exacerbation ([facility] Active Problems:   Hyperthyroidism   Essential hypertension   Acute on chronic respiratory failure with hypoxia and hypercapnia (HCC)   Chronic diastolic congestive heart failure (HCC)   Lung nodules   Diabetes mellitus due to underlying condition without complication, without long-term current use of insulin (HCC)   Hyperkalemia   Hyperlipidemia   Acute metabolic encephalopathy    Discharge Instructions  Discharge Instructions     Ambulatory referral to Pulmonology   Complete by: As directed    Need repeat CT chest in 346-monthas well with history of COPD.   Reason for referral:  Lung Mass/Lung Nodule   Diet - low sodium heart healthy   Complete by: As directed    Discharge instructions   Complete by: As directed    Follow with Primary MD Biagio Borg, MD in 10 days   Get CBC, CMP, checked  by Primary MD next visit.    Activity: As tolerated with Full fall  precautions use walker/cane & assistance as needed   Disposition Home   Diet: Heart Healthy   On your next visit with your primary care physician please Get Medicines reviewed and adjusted.   Please request your Prim.MD to go over all Hospital Tests and Procedure/Radiological results at the follow up, please get all Hospital records sent to your Prim MD by signing hospital release before you go home.   If you experience worsening of your admission symptoms, develop shortness of breath, life threatening emergency, suicidal or homicidal thoughts you must seek medical attention immediately by calling 911 or calling your MD immediately  if symptoms less severe.  You Must read complete instructions/literature along with all the possible adverse reactions/side effects for all the Medicines you take and that have been prescribed to you. Take any new Medicines after you have completely understood and accpet all the possible adverse reactions/side effects.   Do not drive, operating heavy machinery, perform activities at heights, swimming or participation in water activities or provide baby sitting services if your were admitted for syncope or siezures until you have seen by Primary MD or a Neurologist and advised to do so again.  Do not drive when taking Pain medications.    Do not take more than prescribed Pain, Sleep and Anxiety Medications  Special Instructions: If you have smoked or chewed Tobacco  in the last 2 yrs please stop smoking, stop any regular Alcohol  and or any Recreational drug use.  Wear Seat belts while driving.   Please note  You were cared for by a hospitalist during your hospital stay. If you have any questions about your discharge medications or the care you received while you were in the hospital after you are discharged, you can call the unit and asked to speak with the hospitalist on call if the hospitalist that took care of you is not available. Once you are  discharged, your primary care physician will handle any further medical issues. Please note that NO REFILLS for any discharge medications will be authorized once you are discharged, as it is imperative that you return to your primary care physician (or establish a relationship with a primary care physician if you do not have one) for your aftercare needs so that they can reassess your need for medications and monitor your lab values.   Increase activity slowly   Complete by: As directed       Allergies as of 09/04/2022       Reactions   Alendronate Sodium Other (See Comments)   Patient does not remember this reaction        Medication List     TAKE these medications    acetaminophen 325 MG tablet Commonly known as: TYLENOL Take 2 tablets (650 mg total) by mouth every 6 (six) hours as needed for mild pain (or Fever >/= 101).   albuterol 108 (90 Base) MCG/ACT inhaler Commonly known as: VENTOLIN HFA INHALE 1 TO 2 PUFFS INTO THE LUNGS EVERY 6 HOURS AS NEEDED FOR WHEEZING OR SHORTNESS OF BREATH What changed: See the new instructions.   aspirin EC 81 MG tablet  Take 1 tablet (81 mg total) by mouth daily.   atorvastatin 10 MG tablet Commonly known as: LIPITOR TAKE 1 TABLET (10 MG TOTAL) BY MOUTH DAILY. What changed:  how much to take how to take this when to take this additional instructions   citalopram 20 MG tablet Commonly known as: CELEXA TAKE ONE TABLET BY MOUTH DAILY What changed:  how much to take how to take this when to take this additional instructions   docusate sodium 100 MG capsule Commonly known as: Colace Take 1 capsule (100 mg total) by mouth 2 (two) times daily.   fluticasone 50 MCG/ACT nasal spray Commonly known as: FLONASE Place 1 spray into both nostrils daily. What changed:  when to take this reasons to take this   furosemide 20 MG tablet Commonly known as: Lasix Take 1 tablet (20 mg total) by mouth daily.   ipratropium-albuterol 0.5-2.5  (3) MG/3ML Soln Commonly known as: DUONEB Take 3 mLs by nebulization every 6 (six) hours as needed. What changed: reasons to take this   levothyroxine 50 MCG tablet Commonly known as: SYNTHROID Take 1 tablet (50 mcg total) by mouth daily.   loratadine 10 MG tablet Commonly known as: CLARITIN Take 1 tablet (10 mg total) by mouth daily. What changed:  when to take this reasons to take this   montelukast 10 MG tablet Commonly known as: SINGULAIR Take 1 tablet (10 mg total) by mouth at bedtime.   OXYGEN Inhale 3 L into the lungs continuous. 24/7   potassium chloride 10 MEQ tablet Commonly known as: KLOR-CON M Take 1 tablet (10 mEq total) by mouth daily.   predniSONE 10 MG (21) Tbpk tablet Commonly known as: STERAPRED UNI-PAK 21 TAB Please use per package instruction   Trelegy Ellipta 200-62.5-25 MCG/ACT Aepb Generic drug: Fluticasone-Umeclidin-Vilant INHALE 1 PUFF INTO THE LUNGS DAILY. What changed:  when to take this reasons to take this        [place]. Follow up.   Why: Home health has been arranged .They will contact you to schedule apt. Contact information: [place] [address] [phone]         Rigoberto Noel, MD Follow up on 09/17/2022.   Specialty: Pulmonary Disease Contact information: [address] [place] [address] [phone]                Allergies  Allergen Reactions   Alendronate Sodium Other (See Comments)    Patient does not remember this reaction    Consultations: PCCM   Procedures/Studies: CT CHEST WO CONTRAST  Result Date: 09/02/2022 CLINICAL DATA:  Shortness of breath EXAM: CT CHEST WITHOUT CONTRAST TECHNIQUE: Multidetector CT imaging of the chest was performed following the standard protocol without IV contrast. RADIATION DOSE REDUCTION: This exam was performed according to the departmental dose-optimization program which  includes automated exposure control, adjustment of the mA and/or kV according to patient size and/or use of iterative reconstruction technique. COMPARISON:  CT 04/27/2022, 11/24/2018 FINDINGS: Cardiovascular: Heart size within normal limits. No significant pericardial effusion. Thoracic aorta is nonaneurysmal. Scattered atherosclerotic vascular calcifications of the aorta and coronary arteries. Central pulmonary vasculature is within normal limits. Mediastinum/Nodes: No enlarged mediastinal or axillary lymph nodes. Thyroid gland, trachea, and esophagus demonstrate no significant findings. Lungs/Pleura: Small left and trace right pleural effusions with associated compressive atelectasis. Chronic scarring in the periphery of the right upper lobe (series 4, image 66),  unchanged. Stable nodular opacity in the left upper lobe measuring up to 1.0 cm (series 4, image 49). Additional stable 3 mm peripheral left upper lobe nodule seen on the same slice. New 10 x 6 ovoid nodular density abutting the pleural surface at the left lung base (series 4, image 128) which could be a site of atelectasis. Background of mild emphysematous changes. No pneumothorax. Upper Abdomen: No acute abnormality. Musculoskeletal: No chest wall mass or suspicious bone lesions identified. Subtle superior endplate compression deformity at L1 is unchanged. IMPRESSION: 1. Small left and trace right pleural effusions with associated compressive atelectasis. 2. New 10 x 6 mm ovoid nodular density abutting the pleural surface at the left lung base which could be a site of atelectasis. Consider one of the following in 3 months for both low-risk and high-risk individuals: (a) repeat chest CT, (b) follow-up PET-CT, or (c) tissue sampling. This recommendation follows the consensus statement: Guidelines for Management of Incidental Pulmonary Nodules Detected on CT Images: From the Fleischner Society 2017; Radiology 2017; 284:228-243. 3. Stable left upper lobe  nodules measuring up to 1.0 cm. 4. Aortic and coronary artery atherosclerosis (ICD10-I70.0). Electronically Signed   By: Davina Poke D.O.   On: 09/02/2022 09:11   CT HEAD WO CONTRAST (5MM)  Result Date: 09/01/2022 CLINICAL DATA:  Altered mental status. EXAM: CT HEAD WITHOUT CONTRAST TECHNIQUE: Contiguous axial images were obtained from the base of the skull through the vertex without intravenous contrast. RADIATION DOSE REDUCTION: This exam was performed according to the departmental dose-optimization program which includes automated exposure control, adjustment of the mA and/or kV according to patient size and/or use of iterative reconstruction technique. COMPARISON:  August 14, 2019. FINDINGS: Brain: No evidence of acute infarction, hemorrhage, hydrocephalus, extra-axial collection or mass lesion/mass effect. Vascular: No hyperdense vessel or unexpected calcification. Skull: Normal. Negative for fracture or focal lesion. Sinuses/Orbits: No acute finding. Other: None. IMPRESSION: No acute intracranial abnormality seen. Electronically Signed   By: Marijo Conception M.D.   On: 09/01/2022 15:23   DG Chest Portable 1 View  Result Date: 09/01/2022 CLINICAL DATA:  Frothy sputum and wheezing EXAM: PORTABLE CHEST 1 VIEW COMPARISON:  07/09/2022 FINDINGS: Subtle airspace density in the RIGHT upper lobe is improved from comparison exam. Small LEFT effusion is similar. No focal consolidation. No pneumothorax. Atherosclerotic calcification of the aorta. IMPRESSION: 1. Persistent nodular airspace disease in the RIGHT upper lobe. 2. Persistent small LEFT effusion. 3. No progressive finding from comparison exam 07/18/2022. Electronically Signed   By: Suzy Bouchard M.D.   On: 09/01/2022 14:59      Subjective: No shortness of breath, no cough, she is eager to go home today  Discharge Exam: Vitals:   09/04/22 0759 09/04/22 0810  BP: 103/64   Pulse: 66   Resp: 17   Temp: (!) 97.5 F (36.4 C)   SpO2:  100% 100%   Vitals:   09/03/22 2345 09/04/22 0241 09/04/22 0759 09/04/22 0810  BP: 111/68 101/63 103/64   Pulse: 74 70 66   Resp: '16 16 17   '$ Temp: 97.9 F (36.6 C)  (!) 97.5 F (36.4 C)   TempSrc: Oral  Axillary   SpO2: 99% 100% 100% 100%  Weight:      Height:        General: Pt is alert, awake, not in acute distress, frail, thin appearing Cardiovascular: RRR, S1/S2 +, no rubs, no gallops Respiratory: CTA bilaterally, no wheezing, no rhonchi Abdominal: Soft, NT, ND, bowel sounds + Extremities:  no edema, no cyanosis    The results of significant diagnostics from this hospitalization (including imaging, microbiology, ancillary and laboratory) are listed below for reference.     Microbiology: Recent Results (from the past 240 hour(s))  Resp Panel by RT-PCR (Flu A&B, Covid) Anterior Nasal Swab     Status: None   Collection Time: 09/01/22  2:29 PM   Specimen: Anterior Nasal Swab  Result Value Ref Range Status   SARS Coronavirus 2 by RT PCR NEGATIVE NEGATIVE Final    Comment: (NOTE) SARS-CoV-2 target nucleic acids are NOT DETECTED.  The SARS-CoV-2 RNA is generally detectable in upper respiratory specimens during the acute phase of infection. The lowest concentration of SARS-CoV-2 viral copies this assay can detect is 138 copies/mL. A negative result does not preclude SARS-Cov-2 infection and should not be used as the sole basis for treatment or other patient management decisions. A negative result may occur with  improper specimen collection/handling, submission of specimen other than nasopharyngeal swab, presence of viral mutation(s) within the areas targeted by this assay, and inadequate number of viral copies(<138 copies/mL). A negative result must be combined with clinical observations, patient history, and epidemiological information. The expected result is Negative.  Fact Sheet for Patients:  EntrepreneurPulse.com.au  Fact Sheet for Healthcare  Providers:  IncredibleEmployment.be  This test is no t yet approved or cleared by the [place] FDA and  has been authorized for detection and/or diagnosis of SARS-CoV-2 by FDA under an Emergency Use Authorization (EUA). This EUA will remain  in effect (meaning this test can be used) for the duration of the COVID-19 declaration under Section 564(b)(1) of the Act, 21 U.S.C.section 360bbb-3(b)(1), unless the authorization is terminated  or revoked sooner.       Influenza A by PCR NEGATIVE NEGATIVE Final   Influenza B by PCR NEGATIVE NEGATIVE Final    Comment: (NOTE) The Xpert Xpress SARS-CoV-2/FLU/RSV plus assay is intended as an aid in the diagnosis of influenza from Nasopharyngeal swab specimens and should not be used as a sole basis for treatment. Nasal washings and aspirates are unacceptable for Xpert Xpress SARS-CoV-2/FLU/RSV testing.  Fact Sheet for Patients: EntrepreneurPulse.com.au  Fact Sheet for Healthcare Providers: IncredibleEmployment.be  This test is not yet approved or cleared by the [place] FDA and has been authorized for detection and/or diagnosis of SARS-CoV-2 by FDA under an Emergency Use Authorization (EUA). This EUA will remain in effect (meaning this test can be used) for the duration of the COVID-19 declaration under Section 564(b)(1) of the Act, 21 U.S.C. section 360bbb-3(b)(1), unless the authorization is terminated or revoked.  Performed at [facility], [place] [address] [place], [place] [address]   Respiratory (~20 pathogens) panel by PCR     Status: None   Collection Time: 09/01/22  2:29 PM   Specimen: Nasopharyngeal Swab; Respiratory  Result Value Ref Range Status   Adenovirus NOT DETECTED NOT DETECTED Final   Coronavirus 229E NOT DETECTED NOT DETECTED Final    Comment: (NOTE) The Coronavirus on the Respiratory Panel, DOES NOT test for the novel  Coronavirus (2019 nCoV)     Coronavirus HKU1 NOT DETECTED NOT DETECTED Final   Coronavirus NL63 NOT DETECTED NOT DETECTED Final   Coronavirus OC43 NOT DETECTED NOT DETECTED Final   Metapneumovirus NOT DETECTED NOT DETECTED Final   Rhinovirus / Enterovirus NOT DETECTED NOT DETECTED Final   Influenza A NOT DETECTED NOT DETECTED Final   Influenza B NOT DETECTED NOT DETECTED Final   Parainfluenza Virus  1 NOT DETECTED NOT DETECTED Final   Parainfluenza Virus 2 NOT DETECTED NOT DETECTED Final   Parainfluenza Virus 3 NOT DETECTED NOT DETECTED Final   Parainfluenza Virus 4 NOT DETECTED NOT DETECTED Final   Respiratory Syncytial Virus NOT DETECTED NOT DETECTED Final   Bordetella pertussis NOT DETECTED NOT DETECTED Final   Bordetella Parapertussis NOT DETECTED NOT DETECTED Final   Chlamydophila pneumoniae NOT DETECTED NOT DETECTED Final   Mycoplasma pneumoniae NOT DETECTED NOT DETECTED Final    Comment: Performed at [facility], [place] [address], [place], [place] [address]     Labs: BNP (last 3 results) Recent Labs    09/01/22 1426 09/03/22 0354 09/04/22 0512  BNP 16.7 33.5 53.9   Basic Metabolic Panel: Recent Labs  Lab 09/01/22 1424 09/01/22 1442 09/01/22 1806 09/01/22 2339 09/02/22 0419 09/03/22 0354 09/04/22 0512  NA 141   < > 139 139 141 137 138  K 5.4*   < > 4.3 3.9 4.2 3.5 3.5  CL 98  --   --   --  91* 85* 91*  CO2 35*  --   --   --  40* 42* 39*  GLUCOSE 120*  --   --   --  133* 105* 91  BUN 8  --   --   --  9 16 26*  CREATININE 0.71  --   --   --  0.74 0.76 0.90  CALCIUM 9.3  --   --   --  9.0 9.0 10.0  MG  --   --   --   --  1.3* 1.7 2.0  PHOS  --   --   --   --  2.7 3.7  --    < > = values in this interval not displayed.   Liver Function Tests: Recent Labs  Lab 09/01/22 1424 09/02/22 0419  AST 28 21  ALT 15 12  ALKPHOS 50 41  BILITOT 0.4 0.4  PROT 7.0 6.2*  ALBUMIN 3.9 3.2*   Recent Labs  Lab 09/01/22 1424  LIPASE 30   No results for input(s): "AMMONIA" in the last 168  hours. CBC: Recent Labs  Lab 09/01/22 1424 09/01/22 1442 09/01/22 1806 09/01/22 2339 09/02/22 0419 09/03/22 0354 09/04/22 0512  WBC 5.5  --   --   --  6.5 11.3* 9.3  NEUTROABS 4.4  --   --   --   --  8.4* 6.2  HGB 13.8   < > 14.6 13.3 11.1* 12.0 13.1  HCT 46.1*   < > 43.0 39.0 37.3 39.0 41.9  MCV 105.7*  --   --   --  104.8* 101.3* 98.8  PLT 187  --   --   --  186 199 199   < > = values in this interval not displayed.   Cardiac Enzymes: No results for input(s): "CKTOTAL", "CKMB", "CKMBINDEX", "TROPONINI" in the last 168 hours. BNP: Invalid input(s): "POCBNP" CBG: Recent Labs  Lab 09/03/22 1821 09/03/22 2015 09/03/22 2356 09/04/22 0248 09/04/22 0800  GLUCAP 115* 105* 126* 127* 120*   D-Dimer No results for input(s): "DDIMER" in the last 72 hours. Hgb A1c No results for input(s): "HGBA1C" in the last 72 hours. Lipid Profile No results for input(s): "CHOL", "HDL", "LDLCALC", "TRIG", "CHOLHDL", "LDLDIRECT" in the last 72 hours. Thyroid function studies Recent Labs    09/01/22 1426  TSH 0.316*   Anemia work up No results for input(s): "VITAMINB12", "FOLATE", "FERRITIN", "TIBC", "IRON", "RETICCTPCT" in the  last 72 hours. Urinalysis    Component Value Date/Time   COLORURINE YELLOW 09/01/2022 1608   APPEARANCEUR CLEAR 09/01/2022 1608   LABSPEC 1.020 09/01/2022 1608   PHURINE 5.0 09/01/2022 1608   GLUCOSEU NEGATIVE 09/01/2022 1608   GLUCOSEU NEGATIVE 02/25/2022 1549   HGBUR NEGATIVE 09/01/2022 1608   BILIRUBINUR NEGATIVE 09/01/2022 1608   BILIRUBINUR negative 08/11/2011 1358   KETONESUR 5 (A) 09/01/2022 1608   PROTEINUR 100 (A) 09/01/2022 1608   UROBILINOGEN 0.2 02/25/2022 1549   NITRITE NEGATIVE 09/01/2022 1608   LEUKOCYTESUR NEGATIVE 09/01/2022 1608   Sepsis Labs Recent Labs  Lab 09/01/22 1424 09/02/22 0419 09/03/22 0354 09/04/22 0512  WBC 5.5 6.5 11.3* 9.3   Microbiology Recent Results (from the past 240 hour(s))  Resp Panel by RT-PCR (Flu A&B,  Covid) Anterior Nasal Swab     Status: None   Collection Time: 09/01/22  2:29 PM   Specimen: Anterior Nasal Swab  Result Value Ref Range Status   SARS Coronavirus 2 by RT PCR NEGATIVE NEGATIVE Final    Comment: (NOTE) SARS-CoV-2 target nucleic acids are NOT DETECTED.  The SARS-CoV-2 RNA is generally detectable in upper respiratory specimens during the acute phase of infection. The lowest concentration of SARS-CoV-2 viral copies this assay can detect is 138 copies/mL. A negative result does not preclude SARS-Cov-2 infection and should not be used as the sole basis for treatment or other patient management decisions. A negative result may occur with  improper specimen collection/handling, submission of specimen other than nasopharyngeal swab, presence of viral mutation(s) within the areas targeted by this assay, and inadequate number of viral copies(<138 copies/mL). A negative result must be combined with clinical observations, patient history, and epidemiological information. The expected result is Negative.  Fact Sheet for Patients:  EntrepreneurPulse.com.au  Fact Sheet for Healthcare Providers:  IncredibleEmployment.be  This test is no t yet approved or cleared by the [place] FDA and  has been authorized for detection and/or diagnosis of SARS-CoV-2 by FDA under an Emergency Use Authorization (EUA). This EUA will remain  in effect (meaning this test can be used) for the duration of the COVID-19 declaration under Section 564(b)(1) of the Act, 21 U.S.C.section 360bbb-3(b)(1), unless the authorization is terminated  or revoked sooner.       Influenza A by PCR NEGATIVE NEGATIVE Final   Influenza B by PCR NEGATIVE NEGATIVE Final    Comment: (NOTE) The Xpert Xpress SARS-CoV-2/FLU/RSV plus assay is intended as an aid in the diagnosis of influenza from Nasopharyngeal swab specimens and should not be used as a sole basis for treatment. Nasal  washings and aspirates are unacceptable for Xpert Xpress SARS-CoV-2/FLU/RSV testing.  Fact Sheet for Patients: EntrepreneurPulse.com.au  Fact Sheet for Healthcare Providers: IncredibleEmployment.be  This test is not yet approved or cleared by the [place] FDA and has been authorized for detection and/or diagnosis of SARS-CoV-2 by FDA under an Emergency Use Authorization (EUA). This EUA will remain in effect (meaning this test can be used) for the duration of the COVID-19 declaration under Section 564(b)(1) of the Act, 21 U.S.C. section 360bbb-3(b)(1), unless the authorization is terminated or revoked.  Performed at [facility], [place] [address], [place], [place] [address]   Respiratory (~20 pathogens) panel by PCR     Status: None   Collection Time: 09/01/22  2:29 PM   Specimen: Nasopharyngeal Swab; Respiratory  Result Value Ref Range Status   Adenovirus NOT DETECTED NOT DETECTED Final   Coronavirus 229E NOT DETECTED NOT DETECTED  Final    Comment: (NOTE) The Coronavirus on the Respiratory Panel, DOES NOT test for the novel  Coronavirus (2019 nCoV)    Coronavirus HKU1 NOT DETECTED NOT DETECTED Final   Coronavirus NL63 NOT DETECTED NOT DETECTED Final   Coronavirus OC43 NOT DETECTED NOT DETECTED Final   Metapneumovirus NOT DETECTED NOT DETECTED Final   Rhinovirus / Enterovirus NOT DETECTED NOT DETECTED Final   Influenza A NOT DETECTED NOT DETECTED Final   Influenza B NOT DETECTED NOT DETECTED Final   Parainfluenza Virus 1 NOT DETECTED NOT DETECTED Final   Parainfluenza Virus 2 NOT DETECTED NOT DETECTED Final   Parainfluenza Virus 3 NOT DETECTED NOT DETECTED Final   Parainfluenza Virus 4 NOT DETECTED NOT DETECTED Final   Respiratory Syncytial Virus NOT DETECTED NOT DETECTED Final   Bordetella pertussis NOT DETECTED NOT DETECTED Final   Bordetella Parapertussis NOT DETECTED NOT DETECTED Final   Chlamydophila pneumoniae NOT DETECTED  NOT DETECTED Final   Mycoplasma pneumoniae NOT DETECTED NOT DETECTED Final    Comment: Performed at [facility], [place] [address], [place], [place] [address]     Time coordinating discharge: Over 30 minutes  SIGNED:   Phillips Climes, MD  [facility] 09/04/2022, 11:16 AM Pager   If 7PM-7AM, please contact night-coverage [email or web address]

## 2022-09-04 NOTE — Progress Notes (Signed)
Assessed for discharge readiness and d/c lounge appropriateness. Patient requires oxygen and has limited mobility therefore isn't discharge lounge appropriate. Husband is coming to transport home. Unsure when he can come. Will attempt to call patient's husband to arrange a time.   Levora Dredge RN

## 2022-09-04 NOTE — TOC Transition Note (Signed)
Pt's insurance not contracted with TOC pharmacy. Prednisone dosepak prescription sent to [facility] (home pharmacy and confirmed with patient).  Garnet Sierras, PharmD

## 2022-09-04 NOTE — Progress Notes (Signed)
RT attempted to help patient with home bipap unit, patient was unsure if she wanted to wear it today. Unit was plugged in and left at bedside.

## 2022-09-04 NOTE — Plan of Care (Addendum)
Patient continue to tolerated 5L [place] with adequate oxygen percentage. She desat in the mid 80s during ambulation to bathroom. No other distress or concerns voice at this time.  Problem: Education: Goal: Knowledge of disease or condition will improve Outcome: Progressing   Problem: Activity: Goal: Ability to tolerate increased activity will improve Outcome: Progressing

## 2022-09-04 NOTE — Discharge Instructions (Signed)
Follow with Primary MD Biagio Borg, MD in 10 days   Get CBC, CMP, checked  by Primary MD next visit.    Activity: As tolerated with Full fall precautions use walker/cane & assistance as needed   Disposition Home   Diet: Heart Healthy   On your next visit with your primary care physician please Get Medicines reviewed and adjusted.   Please request your Prim.MD to go over all Hospital Tests and Procedure/Radiological results at the follow up, please get all Hospital records sent to your Prim MD by signing hospital release before you go home.   If you experience worsening of your admission symptoms, develop shortness of breath, life threatening emergency, suicidal or homicidal thoughts you must seek medical attention immediately by calling 911 or calling your MD immediately  if symptoms less severe.  You Must read complete instructions/literature along with all the possible adverse reactions/side effects for all the Medicines you take and that have been prescribed to you. Take any new Medicines after you have completely understood and accpet all the possible adverse reactions/side effects.   Do not drive, operating heavy machinery, perform activities at heights, swimming or participation in water activities or provide baby sitting services if your were admitted for syncope or siezures until you have seen by Primary MD or a Neurologist and advised to do so again.  Do not drive when taking Pain medications.    Do not take more than prescribed Pain, Sleep and Anxiety Medications  Special Instructions: If you have smoked or chewed Tobacco  in the last 2 yrs please stop smoking, stop any regular Alcohol  and or any Recreational drug use.  Wear Seat belts while driving.   Please note  You were cared for by a hospitalist during your hospital stay. If you have any questions about your discharge medications or the care you received while you were in the hospital after you are discharged,  you can call the unit and asked to speak with the hospitalist on call if the hospitalist that took care of you is not available. Once you are discharged, your primary care physician will handle any further medical issues. Please note that NO REFILLS for any discharge medications will be authorized once you are discharged, as it is imperative that you return to your primary care physician (or establish a relationship with a primary care physician if you do not have one) for your aftercare needs so that they can reassess your need for medications and monitor your lab values.

## 2022-09-04 NOTE — Progress Notes (Signed)
Physical Therapy Treatment Patient Details Name: Jasmine [name] MRN: [id number] DOB: November 09, 1945 Today's Date: 09/04/2022   History of Present Illness 76 yo female with onset of COPD flare up was admitted on 11/13 for SOB, AMS, poor responsiveness and with elevation of CO2.  Pt became more alert with bipap potentially helping to clear CO2.  PMHx:  anxiety, asthma, lung CA, depression, DVT, bronchitis, lumbar radiculopathy, osteoporosis, L shoulder pain, HTN, CHF, chronic bronchitis    PT Comments    The pt was agreeable to session with focus on safety and independence with mobility in anticipation for d/c home today. The pt was able to demo better mobility this session when using RW and 6L O2, needing minG for gait and supervision for sit-stand transfers. Pt agreeable to increased supervision with mobility and use of RW for safety. Reports her spouse can assist as needed after d/c. The pt will continue to benefit from skilled PT to progress functional strength, stability, and endurance to reduce risk of falls after d/c.    Recommendations for follow up therapy are one component of a multi-disciplinary discharge planning process, led by the attending physician.  Recommendations may be updated based on patient status, additional functional criteria and insurance authorization.  Follow Up Recommendations  Home health PT Can patient physically be transported by private vehicle: Yes   Assistance Recommended at Discharge Frequent or constant Supervision/Assistance  Patient can return home with the following A little help with walking and/or transfers;A little help with bathing/dressing/bathroom;Assistance with cooking/housework;Direct supervision/assist for medications management;Direct supervision/assist for financial management;Assist for transportation;Help with stairs or ramp for entrance   Equipment Recommendations  None recommended by PT    Recommendations for Other Services        Precautions / Restrictions Precautions Precautions: Fall Precaution Comments: monitor HR and sats, on 5L at rest and 6L with activity (tank only to 4L or 6L) Restrictions Weight Bearing Restrictions: No     Mobility  Bed Mobility               General bed mobility comments: pt OOB in recliner at start and end of session    Transfers Overall transfer level: Needs assistance Equipment used: Rolling walker (2 wheels) Transfers: Sit to/from Stand Sit to Stand: Supervision           General transfer comment: cues for safety, pt initially standing pulling from RW, cued to push from chair with improved stability.    Ambulation/Gait Ambulation/Gait assistance: Min guard Gait Distance (Feet): 100 Feet Assistive device: Rolling walker (2 wheels) Gait Pattern/deviations: Step-through pattern, Decreased stride length, Wide base of support, Shuffle, Drifts right/left Gait velocity: reduced     General Gait Details: pt stable with BUE support on RW. slowed but VSS on 6L O2     Balance Overall balance assessment: Mild deficits observed, not formally tested                                          Cognition Arousal/Alertness: Awake/alert Behavior During Therapy: WFL for tasks assessed/performed Overall Cognitive Status: Within Functional Limits for tasks assessed                                 General Comments: pt slightly slowed to respond but able to follow all cues and instruction  Exercises      General Comments General comments (skin integrity, edema, etc.): VSS on 6L O2 with walking, pt states she does not ambulate without O2 at home      Pertinent Vitals/Pain Pain Assessment Pain Assessment: No/denies pain Faces Pain Scale: No hurt           PT Goals (current goals can now be found in the care plan section) Acute Rehab PT Goals Patient Stated Goal: to go home and stay there PT Goal Formulation: With  patient Time For Goal Achievement: 09/16/22 Potential to Achieve Goals: Good Progress towards PT goals: Progressing toward goals    Frequency    Min 3X/week      PT Plan Discharge plan needs to be updated       AM-PAC PT "6 Clicks" Mobility   Outcome Measure  Help needed turning from your back to your side while in a flat bed without using bedrails?: None Help needed moving from lying on your back to sitting on the side of a flat bed without using bedrails?: A Little Help needed moving to and from a bed to a chair (including a wheelchair)?: A Little Help needed standing up from a chair using your arms (e.g., wheelchair or bedside chair)?: A Little Help needed to walk in hospital room?: A Little Help needed climbing 3-5 steps with a railing? : A Lot 6 Click Score: 18    End of Session Equipment Utilized During Treatment: Oxygen Activity Tolerance: Treatment limited secondary to medical complications (Comment) Patient left: in chair;with call bell/phone within reach Nurse Communication: Mobility status PT Visit Diagnosis: Unsteadiness on feet (R26.81);Muscle weakness (generalized) (M62.81)     Time: 3154-0086 PT Time Calculation (min) (ACUTE ONLY): 23 min  Charges:  $Gait Training: 8-22 mins $Therapeutic Exercise: 8-22 mins                     West Carbo, PT, DPT   Acute Rehabilitation Department   Sandra Cockayne 09/04/2022, 1:43 PM

## 2022-09-05 ENCOUNTER — Ambulatory Visit (HOSPITAL_COMMUNITY)
Admission: RE | Admit: 2022-09-05 | Discharge: 2022-09-05 | Disposition: A | Discharge status: Home / Self Care | Payer: Medicare PPO | Source: Ambulatory Visit | Attending: Adult Health | Admitting: Adult Health

## 2022-09-05 DIAGNOSIS — I5032 Chronic diastolic (congestive) heart failure: Secondary | ICD-10-CM | POA: Insufficient documentation

## 2022-09-05 DIAGNOSIS — R918 Other nonspecific abnormal finding of lung field: Secondary | ICD-10-CM | POA: Diagnosis not present

## 2022-09-05 DIAGNOSIS — J439 Emphysema, unspecified: Secondary | ICD-10-CM | POA: Diagnosis not present

## 2022-09-10 ENCOUNTER — Encounter: Payer: Self-pay | Admitting: Internal Medicine

## 2022-09-10 ENCOUNTER — Ambulatory Visit (INDEPENDENT_AMBULATORY_CARE_PROVIDER_SITE_OTHER): Payer: Medicare PPO | Admitting: Internal Medicine

## 2022-09-10 VITALS — BP 118/62 | HR 64 | Temp 99.0°F | Ht 60.0 in | Wt 121.0 lb

## 2022-09-10 DIAGNOSIS — J432 Centrilobular emphysema: Secondary | ICD-10-CM | POA: Diagnosis not present

## 2022-09-10 DIAGNOSIS — J9612 Chronic respiratory failure with hypercapnia: Secondary | ICD-10-CM | POA: Diagnosis not present

## 2022-09-10 DIAGNOSIS — I5032 Chronic diastolic (congestive) heart failure: Secondary | ICD-10-CM | POA: Diagnosis not present

## 2022-09-10 NOTE — Assessment & Plan Note (Signed)
Overall stable, NIV machine at home not working but conts to contact her oxygen company but no call back yet.  Cont Home o2 4L

## 2022-09-10 NOTE — Assessment & Plan Note (Signed)
Improved , wt stable, cont lasix qd also encouraged for bilateral compression stockings to knees

## 2022-09-10 NOTE — Progress Notes (Signed)
Patient ID: Jasmine [name], female   DOB: 01/04/1946, 76 y.o.   MRN: [id number]        Chief Complaint: follow up post hospn nov 13 - 16 2023       HPI:  Jasmine [name] is a 76 y.o. female here overall doing well, breathing actually seems better today than when left hospital after hospn with copd exac with chronic hypox hypercap resp failure and diastloic CHF, confusion, contaraction alkalosis tx with acetazolamide. .   Initially required BiPAP.  Also started IV lasix CT chest Nov 19 c/w unchanged pulm mass left lower lung. Today she Conts on Home o2 4L [place].  NIV machine at her house not working at all for qhs use, so she will keep trying to call the oxygen service about this.  Pt denies chest pain, increased sob or doe, wheezing, orthopnea, PND, palpitations, dizziness or syncope, but has leg sweling where the socks seem to dig in.   Pt denies polydipsia, polyuria, or new focal neuro s/s.    Pt denies fever, wt loss, night sweats, loss of appetite, or other constitutional symptoms  No other new complaints   Transitional Care Management elements noted today: 1)  Date of D/C: as above 2)  Medication reconciliation:  done today at end visit 3)  Review of D/C summary or other information:  done today 4)  Review of need for f/u on pending diagnostic tests and treatments:  done today  - will need f/u CT chest at 3 mo 5)  Review of need for Interaction with other providers who will assume or resume care of pt specific problems: done today - none needed 6)  Education of patient/family/guardian or caregiver: done today - niece with her today  Wt Readings from Last 3 Encounters:  09/10/22 121 lb (54.9 kg)  09/01/22 121 lb (54.9 kg)  08/27/22 121 lb (54.9 kg)   BP Readings from Last 3 Encounters:  09/10/22 118/62  09/04/22 (!) 115/56  08/27/22 110/66         Past Medical History:  Diagnosis Date   ALLERGIC RHINITIS 05/01/2007   Anxiety    Arthritis    "hands" (12/28/2015)   ASTHMA 05/01/2007    "since I was a child"   Cancer ([place])    LUNG   CHF (congestive heart failure) ([place])    Chronic bronchitis ([place])    COPD (chronic obstructive pulmonary disease) ([place])    DEPRESSION 07/21/2008   DVT (deep venous thrombosis) ([place]) 1960s   LLE   FATIGUE 10/10/2010   HYPERTENSION 05/01/2007   HYPOTHYROIDISM 08/23/2009   s/p RAI   LUMBAR RADICULOPATHY, RIGHT 08/25/2008   On home oxygen therapy    "3-4L; qd; all the time" (12/28/2015)   OSTEOPOROSIS 05/01/2007   SHOULDER PAIN, LEFT 08/23/2009   SINUSITIS, CHRONIC 10/10/2010   Past Surgical History:  Procedure Laterality Date   APPENDECTOMY     DILATION AND CURETTAGE OF UTERUS  multiple   history of multiple dialations and curettages and miscarriages, unfortunately never carrying a child to term   ECTOPIC PREGNANCY SURGERY  "late '60s or early '70s   ELECTROCARDIOGRAM  06/20/2006   OOPHORECTOMY Right     reports that she quit smoking about 6 years ago. Her smoking use included cigarettes. She started smoking about 58 years ago. She has a 52.00 pack-year smoking history. She has never used smokeless tobacco. She reports that she does not currently use alcohol. She reports that she does not currently use drugs.  family history includes Alcohol abuse in her brother; Asthma in an other family member; Diabetes in her brother; Hypertension in an other family member; Lung cancer in her father; Stroke in her mother; Thyroid disease in her mother. Allergies  Allergen Reactions   Alendronate Sodium Other (See Comments)    Patient does not remember this reaction   Current Outpatient Medications on File Prior to Visit  Medication Sig Dispense Refill   acetaminophen (TYLENOL) 325 MG tablet Take 2 tablets (650 mg total) by mouth every 6 (six) hours as needed for mild pain (or Fever >/= 101).     albuterol (VENTOLIN HFA) 108 (90 Base) MCG/ACT inhaler INHALE 1 TO 2 PUFFS INTO THE LUNGS EVERY 6 HOURS AS NEEDED FOR WHEEZING OR SHORTNESS OF BREATH (Patient taking  differently: Inhale 1-2 puffs into the lungs every 6 (six) hours as needed for wheezing or shortness of breath.) 18 g 4   aspirin EC 81 MG EC tablet Take 1 tablet (81 mg total) by mouth daily. 30 tablet 0   atorvastatin (LIPITOR) 10 MG tablet TAKE 1 TABLET (10 MG TOTAL) BY MOUTH DAILY. (Patient taking differently: Take 10 mg by mouth daily.) 90 tablet 3   citalopram (CELEXA) 20 MG tablet TAKE ONE TABLET BY MOUTH DAILY (Patient taking differently: Take 20 mg by mouth daily.) 90 tablet 3   fluticasone (FLONASE) 50 MCG/ACT nasal spray Place 1 spray into both nostrils daily. (Patient taking differently: Place 1 spray into both nostrils daily as needed for allergies.) 1 mL 2   Fluticasone-Umeclidin-Vilant (TRELEGY ELLIPTA) 200-62.5-25 MCG/ACT AEPB INHALE 1 PUFF INTO THE LUNGS DAILY. (Patient taking differently: Inhale 1 puff into the lungs daily as needed (for shortness of breath).) 60 each 5   furosemide (LASIX) 20 MG tablet Take 1 tablet (20 mg total) by mouth daily. 30 tablet 11   ipratropium-albuterol (DUONEB) 0.5-2.5 (3) MG/3ML SOLN Take 3 mLs by nebulization every 6 (six) hours as needed. (Patient taking differently: Take 3 mLs by nebulization every 6 (six) hours as needed (for shortness of breath).) 360 mL 11   levothyroxine (SYNTHROID) 50 MCG tablet Take 1 tablet (50 mcg total) by mouth daily. 90 tablet 3   loratadine (CLARITIN) 10 MG tablet Take 1 tablet (10 mg total) by mouth daily. (Patient taking differently: Take 10 mg by mouth daily as needed for allergies.)     montelukast (SINGULAIR) 10 MG tablet Take 1 tablet (10 mg total) by mouth at bedtime. 30 tablet 3   OXYGEN Inhale 3 L into the lungs continuous. 24/7     potassium chloride (KLOR-CON M) 10 MEQ tablet Take 1 tablet (10 mEq total) by mouth daily. 90 tablet 3   predniSONE (STERAPRED UNI-PAK 21 TAB) 10 MG (21) TBPK tablet Please use per package instruction 21 tablet 0   docusate sodium (COLACE) 100 MG capsule Take 1 capsule (100 mg total)  by mouth 2 (two) times daily. (Patient not taking: Reported on 09/02/2022) 60 capsule 5   No current facility-administered medications on file prior to visit.        ROS:  All others reviewed and negative.  Objective        PE:  BP 118/62 (BP Location: Left Arm, Patient Position: Sitting, Cuff Size: Large)   Pulse 64   Temp 99 F (37.2 C) (Oral)   Ht 5' (1.524 m)   Wt 121 lb (54.9 kg)   SpO2 95%   BMI 23.63 kg/m  Constitutional: Pt appears in NAD               HENT: Head: NCAT.                Right Ear: External ear normal.                 Left Ear: External ear normal.                Eyes: . Pupils are equal, round, and reactive to light. Conjunctivae and EOM are normal               Nose: without d/c or deformity               Neck: Neck supple. Gross normal ROM               Cardiovascular: Normal rate and regular rhythm.                 Pulmonary/Chest: Effort normal and breath sounds without rales or wheezing.                Abd:  Soft, NT, ND, + BS, no organomegaly               Neurological: Pt is alert. At baseline orientation, motor grossly intact               Skin: Skin is warm. No rashes, no other new lesions, LE edema - 1+ bilateral leg swelling to mid calf               Psychiatric: Pt behavior is normal without agitation   Micro: none  Cardiac tracings I have personally interpreted today:  none  Pertinent Radiological findings (summarize): none   Lab Results  Component Value Date   WBC 9.3 09/04/2022   HGB 13.1 09/04/2022   HCT 41.9 09/04/2022   PLT 199 09/04/2022   GLUCOSE 91 09/04/2022   CHOL 188 02/25/2022   TRIG 44.0 02/25/2022   HDL 102.60 02/25/2022   LDLDIRECT 102.1 10/10/2010   LDLCALC 76 02/25/2022   ALT 12 09/02/2022   AST 21 09/02/2022   NA 138 09/04/2022   K 3.5 09/04/2022   CL 91 (L) 09/04/2022   CREATININE 0.90 09/04/2022   BUN 26 (H) 09/04/2022   CO2 39 (H) 09/04/2022   TSH 0.316 (L) 09/01/2022   INR 1.58 (H)  03/25/2016   HGBA1C 5.9 (A) 08/27/2022   MICROALBUR 7.2 (H) 02/25/2022   Assessment/Plan:  CELINE DISHMAN is a 76 y.o. Black or African American [2] female with  has a past medical history of ALLERGIC RHINITIS (05/01/2007), Anxiety, Arthritis, ASTHMA (05/01/2007), Cancer (HCC), CHF (congestive heart failure) ([place]), Chronic bronchitis ([place]), COPD (chronic obstructive pulmonary disease) ([place]), DEPRESSION (07/21/2008), DVT (deep venous thrombosis) ([place]) (1960s), FATIGUE (10/10/2010), HYPERTENSION (05/01/2007), HYPOTHYROIDISM (08/23/2009), LUMBAR RADICULOPATHY, RIGHT (08/25/2008), On home oxygen therapy, OSTEOPOROSIS (05/01/2007), SHOULDER PAIN, LEFT (08/23/2009), and SINUSITIS, CHRONIC (10/10/2010).  Respiratory failure with hypercapnia (HCC) Overall stable, NIV machine at home not working but conts to contact her oxygen company but no call back yet.  Cont Home o2 4L  COPD (chronic obstructive pulmonary disease) (HCC) End stage, ok for hospice referral as per pt request, f/u Pulmonary as planned  Chronic diastolic congestive heart failure (HCC) Improved , wt stable, cont lasix qd also encouraged for bilateral compression stockings to knees  Followup: Return in about 4 months (around 01/09/2023).  [name], MD 09/10/2022 12:47 PM [place]  [facility]

## 2022-09-10 NOTE — Progress Notes (Signed)
ATC x1.  LVM to return call. 

## 2022-09-10 NOTE — Patient Instructions (Addendum)
Ok to try the daytime only compression stockings for both legs  Please continue all other medications as before, and refills have been done if requested.  Please have the pharmacy call with any other refills you may need.  Please continue your efforts at being more active, low cholesterol diet, and weight control.  Please keep your appointments with your specialists as you may have planned - Dr Sheilah Pigeon will be contacted regarding the referral for: Hospice  Please make an Appointment to return in 4 months, or sooner if needed

## 2022-09-10 NOTE — Telephone Encounter (Signed)
Called and left message for caregiver that since she is not on DPR that I can not give her the CT results to her for patient. Told her to call back with questions or concerns. Nothing further needed

## 2022-09-10 NOTE — Telephone Encounter (Signed)
Care giver is returning a call regarding CT results.  Please call back at [phone]

## 2022-09-10 NOTE — Assessment & Plan Note (Addendum)
End stage, ok for hospice referral as per pt request, f/u Pulmonary as planned

## 2022-09-15 ENCOUNTER — Other Ambulatory Visit: Payer: Self-pay | Admitting: Internal Medicine

## 2022-09-17 ENCOUNTER — Telehealth: Payer: Self-pay | Admitting: Internal Medicine

## 2022-09-17 ENCOUNTER — Ambulatory Visit (INDEPENDENT_AMBULATORY_CARE_PROVIDER_SITE_OTHER): Payer: Medicare PPO | Admitting: Pulmonary Disease

## 2022-09-17 ENCOUNTER — Telehealth: Payer: Self-pay | Admitting: Pulmonary Disease

## 2022-09-17 ENCOUNTER — Encounter (HOSPITAL_BASED_OUTPATIENT_CLINIC_OR_DEPARTMENT_OTHER): Payer: Self-pay | Admitting: Pulmonary Disease

## 2022-09-17 VITALS — BP 112/72 | HR 74 | Temp 99.0°F | Ht 60.0 in | Wt 127.8 lb

## 2022-09-17 DIAGNOSIS — J9621 Acute and chronic respiratory failure with hypoxia: Secondary | ICD-10-CM | POA: Diagnosis not present

## 2022-09-17 DIAGNOSIS — R911 Solitary pulmonary nodule: Secondary | ICD-10-CM

## 2022-09-17 DIAGNOSIS — J9622 Acute and chronic respiratory failure with hypercapnia: Secondary | ICD-10-CM | POA: Diagnosis not present

## 2022-09-17 DIAGNOSIS — I2781 Cor pulmonale (chronic): Secondary | ICD-10-CM | POA: Diagnosis not present

## 2022-09-17 MED ORDER — FUROSEMIDE 40 MG PO TABS: 40.0000 mg | ORAL_TABLET | Freq: Every day | ORAL | 1 refills | Status: DC

## 2022-09-17 MED ORDER — FUROSEMIDE 20 MG PO TABS: 20.0000 mg | ORAL_TABLET | Freq: Every day | ORAL | 11 refills | Status: DC

## 2022-09-17 NOTE — Telephone Encounter (Signed)
Almira from [facility] home health states the Pt' weight is at 127 & she doesn't have her furosemide (LASIX) 40 MG tab    Constance Haw is requesting a call back at: [phone]

## 2022-09-17 NOTE — Assessment & Plan Note (Signed)
She is not a candidate for biopsy.  We will obtain 72-monthfollow-up CT chest for new left lower lobe pulmonary nodule

## 2022-09-17 NOTE — Progress Notes (Signed)
   Subjective:    Patient ID: Jasmine [name], female    DOB: 1946/02/21, 77 y.o.   MRN: [id number]  HPI 76 yo ex- smoker followed for severe COPD, chronic hypoxic and hypercarbic respiratory failure and lung nodules.  She is on nocturnal trilogy device. She has been oxygen dependent since March 2014.  She is maintained on 3 L of oxygen at rest and 4 L of oxygen with activity History of polysubstance abuse with cocaine     Chief Complaint  Patient presents with   Follow-up    Follow up after Ct Scan and hospitalization. Pt would like to talk about Home health starting.    She was hospitalized 06/2022 for COPD exacerbation and acute hypercarbic respiratory failure.  Maintained on 4 L on rest and 6 L on exertion, followed by palliative care as outpatient. Reviewed APP visit 08/15/2022-compliant with NIV She was again hospitalized 09/01/2022 for 3 days for acute hypercarbic respiratory failure and confusion, improved with BiPAP.  She was noted to have contraction alkalosis, received acetazolamide, Lasix was continued She arrives with her niece today, pedal edema is increased, for some reason she is not taking Lasix even though 20 mg as noted on her medication list She reports compliance with oxygen and NIV She indicates some sadness of mood Niece requests homeaide to help her at home   Significant tests/ events reviewed   Ambulatory saturation 08/2020 -3 L/min at rest and 4 L on walking    CT chest 10/2011 >> showed Moderate centrilobular emphysema. Small bilateral pleural effusions with dependent atelectasis   CT chest 07/2016-resolving right upper lobe consolidation, spiculated subcentimeter nodules that have been noted before with low-grade PET hypermetabolism CT chest February 2019 stable right upper lobe scarring, stable bilateral pulmonary nodules, emphysema    CT chest  11/2018>> stable nodules, unchanged bandlike scarring in the inferior right upper lobe stable   CT chest  without contrast 08/2022 bilateral pleural effusion resolved, left lung base new nodule, 9 x 5 mm, other nodules unchanged   PFT  >FEV1 at 0.47 L( 30%), ratio 39 , No BD response (<200cc)  Review of Systems neg for any significant sore throat, dysphagia, itching, sneezing, nasal congestion or excess/ purulent secretions, fever, chills, sweats, unintended wt loss, pleuritic or exertional cp, hempoptysis, orthopnea pnd or change in chronic leg swelling. Also denies presyncope, palpitations, heartburn, abdominal pain, nausea, vomiting, diarrhea or change in bowel or urinary habits, dysuria,hematuria, rash, arthralgias, visual complaints, headache, numbness weakness or ataxia.     Objective:   Physical Exam  Gen. Pleasant, thin, frail in no distress ENT - no thrush, no pallor/icterus,no post nasal drip Neck: No JVD, no thyromegaly, no carotid bruits Lungs: no use of accessory muscles, no dullness to percussion, decreased bilateral without rales or rhonchi  Cardiovascular: Rhythm regular, heart sounds  normal, no murmurs or gallops, 2+ peripheral edema Musculoskeletal: No deformities, no cyanosis or clubbing        Assessment & Plan:

## 2022-09-17 NOTE — Assessment & Plan Note (Signed)
Prescription for Lasix 40 mg was given We will reassess her in 4 to 6 weeks. She will continue on oxygen and NIV

## 2022-09-17 NOTE — Patient Instructions (Signed)
X Lasix 40 mg daily x 30 x 1 refill

## 2022-09-17 NOTE — Assessment & Plan Note (Signed)
Continue nocturnal NIV. Oxygen 24/7 -to maintain saturation 90% and above, needs between 4 to 6 L on exertion

## 2022-09-18 NOTE — Telephone Encounter (Signed)
Called [facility] regarding Script for Lasix.  I spoke with Colletta Maryland, she states they received 2 prescriptions for Lasix yesterday, one for 40 mg and one for 20 mg.  I advised her that according to Dr. Bari Mantis note from yesterday, She was to take Lasix 40 mg daily x 30 x 1 refill.  Previously she was on 20 mg daily.  Advised I would check with Dr. Elsworth Soho and clarify if he wants her to go back to the 20 mg after she finishes the 40 mg.  I told her that she definitely needs the 40 mg filled.  I let her know we would call her back after we clarify.  Dr. Elsworth Soho, Please clarify if patient is to return to previous dose of Lasix 20 mg daily after she finishes the Lasix 40 mg daily (30 tabs with 1 refill).  Pharmacy called and stated they received 2 prescriptions.  Thank you.

## 2022-09-18 NOTE — Telephone Encounter (Signed)
Left message for Jasmine [name] on secure voicemail for verbal orders.

## 2022-09-18 NOTE — Telephone Encounter (Signed)
Spoke with Almira at [facility] and she is requesting verbal orders for PT for one week 4 and OT evaluation.

## 2022-09-18 NOTE — Telephone Encounter (Signed)
Called and spoke with Colletta Maryland and provided clarification regarding Lasix.  She verbalized understanding.  Nothing further needed.

## 2022-09-18 NOTE — Telephone Encounter (Signed)
Lasix already refilled yesterday  Please OK for verbals as well

## 2022-09-22 ENCOUNTER — Telehealth: Payer: Self-pay | Admitting: Internal Medicine

## 2022-09-22 NOTE — Telephone Encounter (Signed)
Spoken with Will from [facility] and was able to give vernal orders for occupational therapy for 2 times a week for 2 weeks - starting next week. Then 1 time a week for 2 weeks.

## 2022-09-22 NOTE — Telephone Encounter (Signed)
Ok for verbals 

## 2022-09-22 NOTE — Telephone Encounter (Signed)
[facility] needs occupational therapy for 2 times a week for 2 weeks - starting next week. Then 1 time a week for 2 weeks.

## 2022-09-23 ENCOUNTER — Telehealth: Payer: Self-pay | Admitting: Internal Medicine

## 2022-09-23 NOTE — Telephone Encounter (Signed)
Patient's weight has dropped to 113 - patient looks good - do you want to change the sliding scale which is set at 116-126?

## 2022-09-23 NOTE — Telephone Encounter (Signed)
Yes, ok to adjust parameter to 110 - 126

## 2022-09-30 ENCOUNTER — Other Ambulatory Visit: Payer: Medicare HMO | Admitting: Internal Medicine

## 2022-10-13 ENCOUNTER — Other Ambulatory Visit: Payer: Self-pay

## 2022-10-13 ENCOUNTER — Encounter (HOSPITAL_COMMUNITY): Payer: Self-pay

## 2022-10-13 ENCOUNTER — Emergency Department (HOSPITAL_COMMUNITY)

## 2022-10-13 ENCOUNTER — Inpatient Hospital Stay (HOSPITAL_COMMUNITY)
Admission: EM | Admit: 2022-10-13 | Discharge: 2022-10-14 | DRG: 190 | Disposition: A | Discharge status: Hospice | Attending: Pulmonary Disease | Admitting: Pulmonary Disease

## 2022-10-13 DIAGNOSIS — Z79899 Other long term (current) drug therapy: Secondary | ICD-10-CM

## 2022-10-13 DIAGNOSIS — Z515 Encounter for palliative care: Secondary | ICD-10-CM

## 2022-10-13 DIAGNOSIS — J9692 Respiratory failure, unspecified with hypercapnia: Secondary | ICD-10-CM | POA: Diagnosis present

## 2022-10-13 DIAGNOSIS — Z9981 Dependence on supplemental oxygen: Secondary | ICD-10-CM | POA: Diagnosis not present

## 2022-10-13 DIAGNOSIS — J9621 Acute and chronic respiratory failure with hypoxia: Secondary | ICD-10-CM | POA: Diagnosis present

## 2022-10-13 DIAGNOSIS — M810 Age-related osteoporosis without current pathological fracture: Secondary | ICD-10-CM | POA: Diagnosis present

## 2022-10-13 DIAGNOSIS — Z1152 Encounter for screening for COVID-19: Secondary | ICD-10-CM | POA: Diagnosis not present

## 2022-10-13 DIAGNOSIS — E039 Hypothyroidism, unspecified: Secondary | ICD-10-CM | POA: Diagnosis present

## 2022-10-13 DIAGNOSIS — Z66 Do not resuscitate: Secondary | ICD-10-CM | POA: Diagnosis present

## 2022-10-13 DIAGNOSIS — M5416 Radiculopathy, lumbar region: Secondary | ICD-10-CM | POA: Diagnosis present

## 2022-10-13 DIAGNOSIS — E875 Hyperkalemia: Secondary | ICD-10-CM | POA: Diagnosis present

## 2022-10-13 DIAGNOSIS — J9622 Acute and chronic respiratory failure with hypercapnia: Secondary | ICD-10-CM | POA: Diagnosis present

## 2022-10-13 DIAGNOSIS — Z79890 Hormone replacement therapy (postmenopausal): Secondary | ICD-10-CM

## 2022-10-13 DIAGNOSIS — J9602 Acute respiratory failure with hypercapnia: Secondary | ICD-10-CM | POA: Diagnosis not present

## 2022-10-13 DIAGNOSIS — J441 Chronic obstructive pulmonary disease with (acute) exacerbation: Secondary | ICD-10-CM | POA: Diagnosis present

## 2022-10-13 DIAGNOSIS — Z87891 Personal history of nicotine dependence: Secondary | ICD-10-CM | POA: Diagnosis not present

## 2022-10-13 DIAGNOSIS — Z888 Allergy status to other drugs, medicaments and biological substances status: Secondary | ICD-10-CM

## 2022-10-13 DIAGNOSIS — R339 Retention of urine, unspecified: Secondary | ICD-10-CM | POA: Diagnosis present

## 2022-10-13 DIAGNOSIS — I10 Essential (primary) hypertension: Secondary | ICD-10-CM | POA: Diagnosis present

## 2022-10-13 DIAGNOSIS — Z8249 Family history of ischemic heart disease and other diseases of the circulatory system: Secondary | ICD-10-CM

## 2022-10-13 DIAGNOSIS — R54 Age-related physical debility: Secondary | ICD-10-CM | POA: Diagnosis present

## 2022-10-13 DIAGNOSIS — F419 Anxiety disorder, unspecified: Secondary | ICD-10-CM | POA: Diagnosis present

## 2022-10-13 DIAGNOSIS — F32A Depression, unspecified: Secondary | ICD-10-CM | POA: Diagnosis present

## 2022-10-13 DIAGNOSIS — J329 Chronic sinusitis, unspecified: Secondary | ICD-10-CM | POA: Diagnosis present

## 2022-10-13 DIAGNOSIS — J432 Centrilobular emphysema: Secondary | ICD-10-CM | POA: Diagnosis present

## 2022-10-13 DIAGNOSIS — Z801 Family history of malignant neoplasm of trachea, bronchus and lung: Secondary | ICD-10-CM

## 2022-10-13 DIAGNOSIS — Z825 Family history of asthma and other chronic lower respiratory diseases: Secondary | ICD-10-CM

## 2022-10-13 DIAGNOSIS — J309 Allergic rhinitis, unspecified: Secondary | ICD-10-CM | POA: Diagnosis present

## 2022-10-13 DIAGNOSIS — Z86718 Personal history of other venous thrombosis and embolism: Secondary | ICD-10-CM

## 2022-10-13 DIAGNOSIS — Z7982 Long term (current) use of aspirin: Secondary | ICD-10-CM

## 2022-10-13 DIAGNOSIS — Z85118 Personal history of other malignant neoplasm of bronchus and lung: Secondary | ICD-10-CM

## 2022-10-13 DIAGNOSIS — G9341 Metabolic encephalopathy: Secondary | ICD-10-CM | POA: Diagnosis present

## 2022-10-13 LAB — BLOOD GAS, ARTERIAL
Acid-Base Excess: 22.4 mmol/L — ABNORMAL HIGH (ref 0.0–2.0)
Acid-Base Excess: 22 mmol/L — ABNORMAL HIGH (ref 0.0–2.0)
Acid-Base Excess: 16.9 mmol/L — ABNORMAL HIGH (ref 0.0–2.0)
Bicarbonate: 57.6 mmol/L — ABNORMAL HIGH (ref 20.0–28.0)
Bicarbonate: 56.9 mmol/L — ABNORMAL HIGH (ref 20.0–28.0)
Bicarbonate: 50.8 mmol/L — ABNORMAL HIGH (ref 20.0–28.0)
Drawn by: 42783
Drawn by: 42783
O2 Saturation: 99.5 %
O2 Saturation: 100 %
O2 Saturation: 99.8 %
Patient temperature: 35.8
Patient temperature: 35.8
Patient temperature: 36.3
pCO2 arterial: >123 mmHg (ref 32–48)
pCO2 arterial: >123 mmHg (ref 32–48)
pCO2 arterial: 123 mmHg (ref 32–48)
pH, Arterial: 7.23 — ABNORMAL LOW (ref 7.35–7.45)
pH, Arterial: 7.24 — ABNORMAL LOW (ref 7.35–7.45)
pH, Arterial: 7.22 — ABNORMAL LOW (ref 7.35–7.45)
pO2, Arterial: 94 mmHg (ref 83–108)
pO2, Arterial: 142 mmHg — ABNORMAL HIGH (ref 83–108)
pO2, Arterial: 101 mmHg (ref 83–108)

## 2022-10-13 LAB — COMPREHENSIVE METABOLIC PANEL
ALT: 16 U/L (ref 0–44)
AST: 29 U/L (ref 15–41)
Albumin: 3.8 g/dL (ref 3.5–5.0)
Alkaline Phosphatase: 42 U/L (ref 38–126)
Anion gap: 11 (ref 5–15)
BUN: 17 mg/dL (ref 8–23)
CO2: 44 mmol/L — ABNORMAL HIGH (ref 22–32)
Calcium: 9.6 mg/dL (ref 8.9–10.3)
Chloride: 86 mmol/L — ABNORMAL LOW (ref 98–111)
Creatinine, Ser: 0.67 mg/dL (ref 0.44–1.00)
GFR, Estimated: >60 mL/min (ref >60)
Glucose, Bld: 127 mg/dL — ABNORMAL HIGH (ref 70–99)
Potassium: 4.8 mmol/L (ref 3.5–5.1)
Sodium: 141 mmol/L (ref 135–145)
Total Bilirubin: 0.8 mg/dL (ref 0.3–1.2)
Total Protein: 7 g/dL (ref 6.5–8.1)

## 2022-10-13 LAB — CBC WITH DIFFERENTIAL/PLATELET
Abs Immature Granulocytes: 0.02 10*3/uL (ref 0.00–0.07)
Basophils Absolute: 0 10*3/uL (ref 0.0–0.1)
Basophils Relative: 1 %
Eosinophils Absolute: 0 10*3/uL (ref 0.0–0.5)
Eosinophils Relative: 0 %
HCT: 41.8 % (ref 36.0–46.0)
Hemoglobin: 12.1 g/dL (ref 12.0–15.0)
Immature Granulocytes: 0 %
Lymphocytes Relative: 9 %
Lymphs Abs: 0.5 10*3/uL — ABNORMAL LOW (ref 0.7–4.0)
MCH: 31.6 pg (ref 26.0–34.0)
MCHC: 28.9 g/dL — ABNORMAL LOW (ref 30.0–36.0)
MCV: 109.1 fL — ABNORMAL HIGH (ref 80.0–100.0)
Monocytes Absolute: 0.5 10*3/uL (ref 0.1–1.0)
Monocytes Relative: 9 %
Neutro Abs: 4.3 10*3/uL (ref 1.7–7.7)
Neutrophils Relative %: 81 %
Platelets: 129 10*3/uL — ABNORMAL LOW (ref 150–400)
RBC: 3.83 MIL/uL — ABNORMAL LOW (ref 3.87–5.11)
RDW: 13 % (ref 11.5–15.5)
WBC: 5.3 10*3/uL (ref 4.0–10.5)
nRBC: 0 % (ref 0.0–0.2)

## 2022-10-13 LAB — MRSA NEXT GEN BY PCR, NASAL: MRSA by PCR Next Gen: NOT DETECTED

## 2022-10-13 LAB — LACTIC ACID, PLASMA: Lactic Acid, Venous: 0.6 mmol/L (ref 0.5–1.9)

## 2022-10-13 LAB — BRAIN NATRIURETIC PEPTIDE: B Natriuretic Peptide: 30.8 pg/mL (ref 0.0–100.0)

## 2022-10-13 LAB — TROPONIN I (HIGH SENSITIVITY)
Troponin I (High Sensitivity): 6 ng/L (ref <18)
Troponin I (High Sensitivity): 7 ng/L (ref <18)

## 2022-10-13 LAB — RESP PANEL BY RT-PCR (RSV, FLU A&B, COVID)  RVPGX2
Influenza A by PCR: NEGATIVE
Influenza B by PCR: NEGATIVE
Resp Syncytial Virus by PCR: NEGATIVE
SARS Coronavirus 2 by RT PCR: NEGATIVE

## 2022-10-13 LAB — GLUCOSE, CAPILLARY
Glucose-Capillary: 101 mg/dL — ABNORMAL HIGH (ref 70–99)
Glucose-Capillary: 104 mg/dL — ABNORMAL HIGH (ref 70–99)

## 2022-10-13 MED ORDER — INSULIN ASPART 100 UNIT/ML IJ SOLN: 0.0000 [IU] | INTRAMUSCULAR | Status: DC

## 2022-10-13 MED ORDER — HEPARIN SODIUM (PORCINE) 5000 UNIT/ML IJ SOLN
5000.0000 [IU] | Freq: Three times a day (TID) | INTRAMUSCULAR | Status: DC
  Administered 2022-10-13 – 2022-10-14 (×3): 5000 [IU] via SUBCUTANEOUS
  Filled 2022-10-13 (×3): qty 1

## 2022-10-13 MED ORDER — IPRATROPIUM-ALBUTEROL 0.5-2.5 (3) MG/3ML IN SOLN: 3.0000 mL | Freq: Four times a day (QID) | RESPIRATORY_TRACT | Status: DC

## 2022-10-13 MED ORDER — METHYLPREDNISOLONE SODIUM SUCC 125 MG IJ SOLR
60.0000 mg | Freq: Two times a day (BID) | INTRAMUSCULAR | Status: DC
  Administered 2022-10-13 – 2022-10-14 (×2): 60 mg via INTRAVENOUS
  Filled 2022-10-13 (×2): qty 2

## 2022-10-13 MED ORDER — ORAL CARE MOUTH RINSE
15.0000 mL | OROMUCOSAL | Status: DC
  Administered 2022-10-14: 15 mL via OROMUCOSAL

## 2022-10-13 MED ORDER — REVEFENACIN 175 MCG/3ML IN SOLN
175.0000 ug | Freq: Every day | RESPIRATORY_TRACT | Status: DC
  Filled 2022-10-13 (×2): qty 3

## 2022-10-13 MED ORDER — SODIUM CHLORIDE 0.9 % IV SOLN: INTRAVENOUS | Status: DC

## 2022-10-13 MED ORDER — SODIUM CHLORIDE 0.9 % IV BOLUS
500.0000 mL | Freq: Once | INTRAVENOUS | Status: AC
  Administered 2022-10-13: 500 mL via INTRAVENOUS

## 2022-10-13 MED ORDER — ARFORMOTEROL TARTRATE 15 MCG/2ML IN NEBU
15.0000 ug | INHALATION_SOLUTION | Freq: Two times a day (BID) | RESPIRATORY_TRACT | Status: DC
  Administered 2022-10-13: 15 ug via RESPIRATORY_TRACT
  Filled 2022-10-13: qty 2

## 2022-10-13 MED ORDER — BUDESONIDE 0.25 MG/2ML IN SUSP
0.2500 mg | Freq: Two times a day (BID) | RESPIRATORY_TRACT | Status: DC
  Administered 2022-10-13: 0.25 mg via RESPIRATORY_TRACT
  Filled 2022-10-13: qty 2

## 2022-10-13 MED ORDER — ONDANSETRON HCL 4 MG/2ML IJ SOLN: 4.0000 mg | Freq: Four times a day (QID) | INTRAMUSCULAR | Status: DC | PRN

## 2022-10-13 MED ORDER — DEXTROSE 5 % IV SOLN
250.0000 mg | INTRAVENOUS | Status: DC
  Filled 2022-10-13 (×2): qty 2.5

## 2022-10-13 MED ORDER — ORAL CARE MOUTH RINSE: 15.0000 mL | OROMUCOSAL | Status: DC | PRN

## 2022-10-13 MED ORDER — POLYETHYLENE GLYCOL 3350 17 G PO PACK: 17.0000 g | PACK | Freq: Every day | ORAL | Status: DC | PRN

## 2022-10-13 MED ORDER — DOCUSATE SODIUM 100 MG PO CAPS: 100.0000 mg | ORAL_CAPSULE | Freq: Two times a day (BID) | ORAL | Status: DC | PRN

## 2022-10-13 MED ORDER — IPRATROPIUM-ALBUTEROL 0.5-2.5 (3) MG/3ML IN SOLN: 3.0000 mL | Freq: Four times a day (QID) | RESPIRATORY_TRACT | Status: DC | PRN

## 2022-10-13 MED ORDER — CHLORHEXIDINE GLUCONATE CLOTH 2 % EX PADS
6.0000 | MEDICATED_PAD | Freq: Every day | CUTANEOUS | Status: DC
  Administered 2022-10-13 – 2022-10-14 (×2): 6 via TOPICAL

## 2022-10-13 NOTE — ED Notes (Signed)
1st blood culture obtained from [place] IV 20g R upper arm

## 2022-10-13 NOTE — ED Notes (Deleted)
Filed by mistake

## 2022-10-13 NOTE — ED Provider Notes (Signed)
[facility] EMERGENCY DEPARTMENT Provider Note   CSN: [id number] Arrival date & time: 10/13/22  7672     History  Chief Complaint  Patient presents with   Altered Mental Status    Jasmine [name] is a 76 y.o. female.  Patient brought in by EMS.  Patient known to have severe COPD is on hospice care.  They reported she is normally on 4 L of oxygen at all times.  Per chart review showed the patient had admission for hypoxia and hypercapnia in November.  Treated with BiPAP and then improved.  EMS stated that family said she was a little bit lethargic last evening.  When they went to wake her up this morning she was unresponsive.  Blood sugars were good blood pressure was good.  Oxygen sats on 4 L was actually like 100%.  Patient not really arousable.  But was maintaining her airway.  During last admission patient was listed as a full code.  Patient followed by hospice care.  Past medical history also significant for hypertension atrial fibs hypercapnia COPD and CHF.       Home Medications Prior to Admission medications   Medication Sig Start Date End Date Taking? Authorizing Provider  acetaminophen (TYLENOL) 325 MG tablet Take 2 tablets (650 mg total) by mouth every 6 (six) hours as needed for mild pain (or Fever >/= 101). 05/01/22   Georgette Shell, MD  albuterol (VENTOLIN HFA) 108 (90 Base) MCG/ACT inhaler INHALE 1 TO 2 PUFFS INTO THE LUNGS EVERY 6 HOURS AS NEEDED FOR WHEEZING OR SHORTNESS OF BREATH Patient taking differently: Inhale 1-2 puffs into the lungs every 6 (six) hours as needed for wheezing or shortness of breath. 08/19/22   Biagio Borg, MD  aspirin EC 81 MG EC tablet Take 1 tablet (81 mg total) by mouth daily. 03/27/16   Velvet Bathe, MD  atorvastatin (LIPITOR) 10 MG tablet TAKE 1 TABLET (10 MG TOTAL) BY MOUTH DAILY. Patient taking differently: Take 10 mg by mouth daily. 08/19/22   Biagio Borg, MD  citalopram (CELEXA) 20 MG tablet TAKE ONE TABLET  BY MOUTH DAILY Patient taking differently: Take 20 mg by mouth daily. 07/23/22   Biagio Borg, MD  docusate sodium (COLACE) 100 MG capsule Take 1 capsule (100 mg total) by mouth 2 (two) times daily. 07/31/22   Biagio Borg, MD  fluticasone (FLONASE) 50 MCG/ACT nasal spray Place 1 spray into both nostrils daily. Patient taking differently: Place 1 spray into both nostrils daily as needed for allergies. 05/02/22   Georgette Shell, MD  Fluticasone-Umeclidin-Vilant (TRELEGY ELLIPTA) 200-62.5-25 MCG/ACT AEPB INHALE 1 PUFF INTO THE LUNGS DAILY. Patient taking differently: Inhale 1 puff into the lungs daily as needed (for shortness of breath). 05/21/22   Biagio Borg, MD  furosemide (LASIX) 20 MG tablet Take 1 tablet (20 mg total) by mouth daily. 09/17/22 09/17/23  Rigoberto Noel, MD  furosemide (LASIX) 40 MG tablet Take 1 tablet (40 mg total) by mouth daily. 09/17/22   Rigoberto Noel, MD  ipratropium-albuterol (DUONEB) 0.5-2.5 (3) MG/3ML SOLN Take 3 mLs by nebulization every 6 (six) hours as needed. Patient taking differently: Take 3 mLs by nebulization every 6 (six) hours as needed (for shortness of breath). 11/05/21   Rigoberto Noel, MD  levothyroxine (SYNTHROID) 50 MCG tablet Take 1 tablet (50 mcg total) by mouth daily. 08/29/22   Shamleffer, Melanie Crazier, MD  loratadine (CLARITIN) 10 MG tablet Take 1 tablet (10  mg total) by mouth daily. Patient taking differently: Take 10 mg by mouth daily as needed for allergies. 05/02/22   Georgette Shell, MD  montelukast (SINGULAIR) 10 MG tablet Take 1 tablet (10 mg total) by mouth at bedtime. 07/21/22   Danford, Suann Larry, MD  OXYGEN Inhale 4 L into the lungs continuous.    [provider]  potassium chloride (KLOR-CON M) 10 MEQ tablet Take 1 tablet (10 mEq total) by mouth daily. 06/30/22   Biagio Borg, MD  predniSONE (STERAPRED UNI-PAK 21 TAB) 10 MG (21) TBPK tablet Please use per package instruction 09/04/22   Elgergawy, Silver Huguenin, MD       Allergies    Fosamax [alendronate sodium]    Review of Systems   Review of Systems  Unable to perform ROS: Patient unresponsive    Physical Exam Updated Vital Signs BP (!) 144/60   Pulse 77   Temp (!) 96.5 F (35.8 C) (Rectal)   Resp (!) 21   Ht 1.524 m (5')   Wt 58 kg   SpO2 100%   BMI 24.97 kg/m  Physical Exam Vitals and nursing note reviewed.  Constitutional:      General: She is in acute distress.     Appearance: She is well-developed. She is not diaphoretic.  HENT:     Head: Normocephalic and atraumatic.  Eyes:     Extraocular Movements: Extraocular movements intact.     Conjunctiva/sclera: Conjunctivae normal.     Pupils: Pupils are equal, round, and reactive to light.  Cardiovascular:     Rate and Rhythm: Normal rate and regular rhythm.     Heart sounds: No murmur heard. Pulmonary:     Effort: Respiratory distress present.     Breath sounds: Normal breath sounds. No wheezing, rhonchi or rales.  Abdominal:     Palpations: Abdomen is soft.     Tenderness: There is no abdominal tenderness.  Musculoskeletal:        General: No swelling.     Cervical back: Neck supple.  Skin:    General: Skin is warm and dry.     Capillary Refill: Capillary refill takes less than 2 seconds.  Neurological:     General: No focal deficit present.     Comments: Patient initially unresponsive.  We lowered her oxygen 2 L and oxygen sats remained good.  She actually started to wake up more.  As time went on she started to move her extremities spontaneously.  And verbalized some.  And would open her eyes.  Psychiatric:        Mood and Affect: Mood normal.     ED Results / Procedures / Treatments   Labs (all labs ordered are listed, but only abnormal results are displayed) Labs Reviewed  COMPREHENSIVE METABOLIC PANEL - Abnormal; Notable for the following components:      Result Value   Chloride 86 (*)    CO2 44 (*)    Glucose, Bld 127 (*)    All other components within  normal limits  CBC WITH DIFFERENTIAL/PLATELET - Abnormal; Notable for the following components:   RBC 3.83 (*)    MCV 109.1 (*)    MCHC 28.9 (*)    Platelets 129 (*)    Lymphs Abs 0.5 (*)    All other components within normal limits  BLOOD GAS, ARTERIAL - Abnormal; Notable for the following components:   pH, Arterial 7.23 (*)    pCO2 arterial >123 (*)    Bicarbonate 57.6 (*)  Acid-Base Excess 22.4 (*)    All other components within normal limits  BLOOD GAS, ARTERIAL - Abnormal; Notable for the following components:   pH, Arterial 7.24 (*)    pCO2 arterial >123 (*)    pO2, Arterial 142 (*)    Bicarbonate 56.9 (*)    Acid-Base Excess 22.0 (*)    All other components within normal limits  RESP PANEL BY RT-PCR (RSV, FLU A&B, COVID)  RVPGX2  CULTURE, BLOOD (ROUTINE X 2)  CULTURE, BLOOD (ROUTINE X 2)  LACTIC ACID, PLASMA  BRAIN NATRIURETIC PEPTIDE  I-STAT CHEM 8, ED  I-STAT VENOUS BLOOD GAS, ED  I-STAT ARTERIAL BLOOD GAS, ED  I-STAT ARTERIAL BLOOD GAS, ED  TROPONIN I (HIGH SENSITIVITY)  TROPONIN I (HIGH SENSITIVITY)    EKG EKG Interpretation  Date/Time:  Monday October 13 2022 09:53:50 EST Ventricular Rate:  78 PR Interval:  189 QRS Duration: 92 QT Interval:  374 QTC Calculation: 426 R Axis:   88 Text Interpretation: Sinus rhythm Biatrial enlargement Anterior infarct, acute (LAD) ST elevation, consider inferior injury No significant change since last tracing Confirmed by Fredia Sorrow [phone]) on 10/13/2022 10:00:58 AM  Radiology CT Head Wo Contrast  Result Date: 10/13/2022 CLINICAL DATA:  Altered mental status today.  Lethargy. EXAM: CT HEAD WITHOUT CONTRAST TECHNIQUE: Contiguous axial images were obtained from the base of the skull through the vertex without intravenous contrast. RADIATION DOSE REDUCTION: This exam was performed according to the departmental dose-optimization program which includes automated exposure control, adjustment of the mA and/or kV according  to patient size and/or use of iterative reconstruction technique. COMPARISON:  09/01/2022 FINDINGS: Brain: No evidence of intracranial hemorrhage, acute infarction, hydrocephalus, extra-axial collection, or mass lesion/mass effect. Vascular:  No hyperdense vessel or other acute findings. Skull: No evidence of fracture or other significant bone abnormality. Sinuses/Orbits:  No acute findings. Other: None. IMPRESSION: Negative noncontrast head CT. Electronically Signed   By: Marlaine Hind M.D.   On: 10/13/2022 16:02   DG Chest Port 1 View  Result Date: 10/13/2022 CLINICAL DATA:  Altered mental status EXAM: PORTABLE CHEST 1 VIEW COMPARISON:  Previous studies including the radiograph done on 09/01/2022 and CT done on 09/05/2022 FINDINGS: Cardiac size is within normal limits. Low position of diaphragms may suggest COPD. Linear density in right upper lung field has not changed suggesting scarring or subsegmental atelectasis. There is improvement in aeration of left lower lung field suggesting resolving atelectasis. Small residual linear densities are seen in left lower lung fields. There is blunting of left lateral CP angle. There is no pneumothorax. IMPRESSION: There is improvement in the aeration in left lower lung fields suggesting decrease in pleural effusion and decrease in infiltrate. There are small linear densities in right upper and left lower lung fields suggesting residual subsegmental atelectasis or scarring. No new infiltrates are seen. There are no signs of alveolar pulmonary edema. Electronically Signed   By: Elmer Picker M.D.   On: 10/13/2022 10:28    Procedures Procedures    Medications Ordered in ED Medications  0.9 %  sodium chloride infusion (has no administration in time range)  0.9 %  sodium chloride infusion ( Intravenous New Bag/Given 10/13/22 1043)  sodium chloride 0.9 % bolus 500 mL (0 mLs Intravenous Stopped 10/13/22 1530)    ED Course/ Medical Decision Making/ A&P                            Medical Decision Making  Amount and/or Complexity of Data Reviewed Labs: ordered. Radiology: ordered.  Risk Prescription drug management. Decision regarding hospitalization.  CRITICAL CARE Performed by: Fredia Sorrow Total critical care time: 45 minutes Critical care time was exclusive of separately billable procedures and treating other patients. Critical care was necessary to treat or prevent imminent or life-threatening deterioration. Critical care was time spent personally by me on the following activities: development of treatment plan with patient and/or surrogate as well as nursing, discussions with consultants, evaluation of patient's response to treatment, examination of patient, obtaining history from patient or surrogate, ordering and performing treatments and interventions, ordering and review of laboratory studies, ordering and review of radiographic studies, pulse oximetry and re-evaluation of patient's condition.  Patient initially had venous blood gas ordered.  But they can get the machine to work properly.  So we started BiPAP and then she had an arterial blood gas done.  First blood gas consistent with pH of 7.23 pCO2 123 pO2 94.  Kind of what we expected.  On the BiPAP patient became more alert started moving all extremities spontaneously.  Actually verbalized some and would open her eyes frequently.  But did not seem to be back to baseline.  No family members have arrived.  Repeat blood gas without significant changes.  Based on that patient been turned over to the evening physician Dr. Gilford Raid.  She contacted critical care skin to come see the patient.  Our thought process was that she would improve over time on the BiPAP.  And probably would be difficult to extubate if intubated.  And we were thinking maybe not the best candidate for intubation.  Certainly clinical improvement on the BiPAP.  Also asked respiratory to go ahead and increase the  respiratory rate on the BiPAP.  Patient when she was admitted in November was treated with BiPAP and had significant improvement.  The rest of her workup was very reassuring CT head without any acute findings.  Troponin is normal blood culture sent lactic acid not elevated so probably no acute infection.  Complete metabolic panel CO2 44 showing that she is a retainer.  Renal function all normal.  LFTs normal.  Sodium and potassium normal.  CBC 5.3 hemoglobin 12.1.  And respiratory panel negative for influenza and COVID and RSV.  Also BNP was not elevated.  And chest x-ray without any significant acute findings certainly no pulmonary edema.  No signs of pneumonia.   Patient clearly will need admission.  Question whether admission by hospitalist service or admission by critical care service.  Based on the number she would think she needs intubation.  But clinically she is doing much better and would probably be very difficult extubation.  Critical care will provide an opinion.   Final Clinical Impression(s) / ED Diagnoses Final diagnoses:  Acute on chronic respiratory failure with hypercapnia [facility])    Rx / DC Orders ED Discharge Orders     None         Fredia Sorrow, MD 10/13/22 [phone]

## 2022-10-13 NOTE — Progress Notes (Signed)
RT gave critical results to Dr. Rogene Houston. No orders were given for any changes. Due to increased CO2 from ABG, RT sent ABG sample to lab.

## 2022-10-13 NOTE — Progress Notes (Signed)
RT assisted with transport of this pt from ED to CT and back while on BiPAP. Pt tolerated well with SVS. RT will continue to monitor pt.

## 2022-10-13 NOTE — ED Notes (Signed)
Notified RT of ABG.

## 2022-10-13 NOTE — Progress Notes (Signed)
RT and RN transported BIPAP patient to 4N ICU, vital signs stable through out.

## 2022-10-13 NOTE — H&P (Signed)
NAME:  Jasmine [name], MRN:  [id number], DOB:  05/16/1946, LOS: 0 ADMISSION DATE:  10/13/2022, CONSULTATION DATE:  10/13/2022 REFERRING MD:   Gilford Raid, CHIEF COMPLAINT:  Hypercarbic respiratory failure   History of Present Illness:  76 year old female former smoker with Severe COPD ( FEV1 30%) with chronic hypoxic and hypercarbic respiratory failure , on home oxygen and nocturnal NIV, brought in to [facility] ED 10/13/2022 by EMS with acute on chronic respiratory failure due to COPD exacerbation. Pt. has had recent admissions for the same diagnosis7/2023,  06/2022 and 08/2022 .  Family state patient was lethargic 12/24 pm . When they went to wake her up 12/25 am she was unresponsive. Blood Sugars and CBG's were WNL.   Upon arrival to ED Oxygen sats on 4 L were 100%, patient was not arousable, but was maintaining her airway. . Initial ABG was 7.23/>123/94/57.6. Patient was placed on BiPAP at rate of 12 as when she had previous admission 08/2022 she improved with BiPAP therapy. Repeat ABG 4 hours later was 7.24/> 123/142/56.9, and after that actually worsened with pH reported at 7.1 and CO2 > 100. ( This did not cross over into EPIC but  was verbally reported by Dr. Gilford Raid.) Patient has baseline CO2 of 60's Labs In the ED troponin >> 6 then 7, BNP 30.8, Lactate 0.6 WBC was 5.3, HGB 12.1, platelets 129 Na 141, K 4.8, Cl 86, CO2 44, Glucose 127, BUN 17, Creatinine 0.67, Calcium 9.6, Albumin 3.8  Pt. Has refused intubation in the past. She is currently followed by Hospice, but no DNR has been completed.  Niece, [name], is her POA .  PCCM have been asked to assist with care as patient will need ICU admission on BiPAP and close ICU monitoring as high risk for intubation,  Pertinent  Medical History   Past Medical History:  Diagnosis Date   ALLERGIC RHINITIS 05/01/2007   Anxiety    Arthritis    "hands" (12/28/2015)   ASTHMA 05/01/2007   "since I was a child"   Cancer ([place])    LUNG   CHF (congestive  heart failure) ([place])    Chronic bronchitis ([place])    COPD (chronic obstructive pulmonary disease) ([place])    DEPRESSION 07/21/2008   DVT (deep venous thrombosis) ([place]) 1960s   LLE   FATIGUE 10/10/2010   HYPERTENSION 05/01/2007   HYPOTHYROIDISM 08/23/2009   s/p RAI   LUMBAR RADICULOPATHY, RIGHT 08/25/2008   On home oxygen therapy    "3-4L; qd; all the time" (12/28/2015)   OSTEOPOROSIS 05/01/2007   SHOULDER PAIN, LEFT 08/23/2009   SINUSITIS, CHRONIC 10/10/2010     Significant Hospital Events: Including procedures, antibiotic start and stop dates in addition to other pertinent events   10/13/2022 Admission to [facility] , BiPAP  Interim History / Subjective:  Arousing on BiPAP, reaching for BiPAP mask   Objective   Blood pressure (!) 144/60, pulse 77, temperature (!) 96.5 F (35.8 C), temperature source Rectal, resp. rate (!) 21, height 5' (1.524 m), weight 58 kg, SpO2 100 %.    Vent Mode: PCV;BIPAP FiO2 (%):  [40 %-60 %] 50 % Set Rate:  [12 bmp] 12 bmp PEEP:  [5 cmH20] 5 cmH20   Intake/Output Summary (Last 24 hours) at 10/13/2022 1717 Last data filed at 10/13/2022 1530 Gross per 24 hour  Intake 500 ml  Output --  Net 500 ml   Filed Weights   10/13/22 0945  Weight: 58 kg    Examination: General: Elderly female  on BiPAP, restless, in NAD HENT:  Temporal wasting, MM pink and dry, No JVD, No LAD Lungs:  Bilateral chest excursion , Coarse, diminished throughout, No wheezing noted Cardiovascular:  S1, S2, RRR, No RMG, SR per tele Abdomen:  Soft, NT, ND, BS + Extremities:  No obvious deformities, 1-2 + LED bilaterally, brisk refill  Neuro:  Does not follow commands, groaning, MAE x 4, reached for BiPAP mask with purpose GU: NA  Resolved Hospital Problem list     Assessment & Plan:  AMS Acute on Chronic Hypercarbic Respiratory Failure COPD/ Centri lobar Emphysema with acute  exacerbation  Suspect she is not wearing her nocturnal NIV Plan Continue BI PAP, with hope ABG  improves and intubation is not necessary Will need nocturnal BiPAP without fail as inpatient and upon DC>> Need to evaluate if she is using this at home  Solumedrol 60 mg BID Scheduled and prn BD, the transition back to Trelegy at DC ABG at  2000 ABG in am and prn Trend CXR Check Mag now and trend>> Goal > 2 Emp[place]ic Azithromycin   Goals of Care  Followed by Hospice but no DNR Plan Pt. Needs palliation consult Goals of care need to be clarified  Best Practice (right click and "Reselect all SmartList Selections" daily)   Diet/type: NPO DVT prophylaxis: prophylactic heparin  GI prophylaxis: N/A Lines: N/A Foley:  Yes, and it is still needed Code Status:  full code Last date of multidisciplinary goals of care discussion [Pending Niece [name]  is POA]  Labs   CBC: Recent Labs  Lab 10/13/22 1017  WBC 5.3  NEUTROABS 4.3  HGB 12.1  HCT 41.8  MCV 109.1*  PLT 129*    Basic Metabolic Panel: Recent Labs  Lab 10/13/22 1017  NA 141  K 4.8  CL 86*  CO2 44*  GLUCOSE 127*  BUN 17  CREATININE 0.67  CALCIUM 9.6   GFR: Estimated Creatinine Clearance: 46.9 mL/min (by C-G formula based on SCr of 0.67 mg/dL). Recent Labs  Lab 10/13/22 1017  WBC 5.3  LATICACIDVEN 0.6    Liver Function Tests: Recent Labs  Lab 10/13/22 1017  AST 29  ALT 16  ALKPHOS 42  BILITOT 0.8  PROT 7.0  ALBUMIN 3.8   No results for input(s): "LIPASE", "AMYLASE" in the last 168 hours. No results for input(s): "AMMONIA" in the last 168 hours.  ABG    Component Value Date/Time   PHART 7.24 (L) 10/13/2022 1611   PCO2ART >123 (HH) 10/13/2022 1611   PO2ART 142 (H) 10/13/2022 1611   HCO3 56.9 (H) 10/13/2022 1611   TCO2 47 (H) 09/01/2022 2339   O2SAT 100 10/13/2022 1611     Coagulation Profile: No results for input(s): "INR", "PROTIME" in the last 168 hours.  Cardiac Enzymes: No results for input(s): "CKTOTAL", "CKMB", "CKMBINDEX", "TROPONINI" in the last 168 hours.  HbA1C: Hemoglobin  A1C  Date/Time Value Ref Range Status  08/27/2022 01:44 PM 5.9 (A) 4.0 - 5.6 % Final  02/25/2022 03:32 PM 6.5 (A) 4.0 - 5.6 % Final   Hgb A1c MFr Bld  Date/Time Value Ref Range Status  04/19/2021 03:51 PM 6.3 4.6 - 6.5 % Final    Comment:    Glycemic Control Guidelines for People with Diabetes:Non Diabetic:  <6%Goal of Therapy: <7%Additional Action Suggested:  >8%   06/08/2020 03:55 PM 6.6 (H) <5.7 % of total Hgb Final    Comment:    For someone without known diabetes, a hemoglobin A1c value of 6.5%  or greater indicates that they may have  diabetes and this should be confirmed with a follow-up  test. . For someone with known diabetes, a value <7% indicates  that their diabetes is well controlled and a value  greater than or equal to 7% indicates suboptimal  control. A1c targets should be individualized based on  duration of diabetes, age, comorbid conditions, and  other considerations. . Currently, no consensus exists regarding use of hemoglobin A1c for diagnosis of diabetes for children. .     CBG: No results for input(s): "GLUCAP" in the last 168 hours.  Review of Systems:   Unable as patient is altered, no family at bedside  Past Medical History:  She,  has a past medical history of ALLERGIC RHINITIS (05/01/2007), Anxiety, Arthritis, ASTHMA (05/01/2007), Cancer ([place]), CHF (congestive heart failure) ([place]), Chronic bronchitis ([place]), COPD (chronic obstructive pulmonary disease) ([place]), DEPRESSION (07/21/2008), DVT (deep venous thrombosis) ([place]) (1960s), FATIGUE (10/10/2010), HYPERTENSION (05/01/2007), HYPOTHYROIDISM (08/23/2009), LUMBAR RADICULOPATHY, RIGHT (08/25/2008), On home oxygen therapy, OSTEOPOROSIS (05/01/2007), SHOULDER PAIN, LEFT (08/23/2009), and SINUSITIS, CHRONIC (10/10/2010).   Surgical History:   Past Surgical History:  Procedure Laterality Date   APPENDECTOMY     DILATION AND CURETTAGE OF UTERUS  multiple   history of multiple dialations and curettages and  miscarriages, unfortunately never carrying a child to term   ECTOPIC PREGNANCY SURGERY  "late '60s or early '70s   ELECTROCARDIOGRAM  06/20/2006   OOPHORECTOMY Right      Social History:   reports that she quit smoking about 6 years ago. Her smoking use included cigarettes. She started smoking about 59 years ago. She has a 52.00 pack-year smoking history. She has never used smokeless tobacco. She reports that she does not currently use alcohol. She reports that she does not currently use drugs.   Family History:  Her family history includes Alcohol abuse in her brother; Asthma in an other family member; Diabetes in her brother; Hypertension in an other family member; Lung cancer in her father; Stroke in her mother; Thyroid disease in her mother. There is no history of Breast cancer.   Allergies Allergies  Allergen Reactions   Fosamax [Alendronate Sodium] Other (See Comments)    Pt does not remember reaction     Home Medications  Prior to Admission medications   Medication Sig Start Date End Date Taking? Authorizing Provider  acetaminophen (TYLENOL) 325 MG tablet Take 2 tablets (650 mg total) by mouth every 6 (six) hours as needed for mild pain (or Fever >/= 101). 05/01/22   Georgette Shell, MD  albuterol (VENTOLIN HFA) 108 (90 Base) MCG/ACT inhaler INHALE 1 TO 2 PUFFS INTO THE LUNGS EVERY 6 HOURS AS NEEDED FOR WHEEZING OR SHORTNESS OF BREATH Patient taking differently: Inhale 1-2 puffs into the lungs every 6 (six) hours as needed for wheezing or shortness of breath. 08/19/22   Biagio Borg, MD  aspirin EC 81 MG EC tablet Take 1 tablet (81 mg total) by mouth daily. 03/27/16   Velvet Bathe, MD  atorvastatin (LIPITOR) 10 MG tablet TAKE 1 TABLET (10 MG TOTAL) BY MOUTH DAILY. Patient taking differently: Take 10 mg by mouth daily. 08/19/22   Biagio Borg, MD  citalopram (CELEXA) 20 MG tablet TAKE ONE TABLET BY MOUTH DAILY Patient taking differently: Take 20 mg by mouth daily. 07/23/22    Biagio Borg, MD  docusate sodium (COLACE) 100 MG capsule Take 1 capsule (100 mg total) by mouth 2 (two) times daily. 07/31/22   Jenny Reichmann,  Hunt Oris, MD  fluticasone (FLONASE) 50 MCG/ACT nasal spray Place 1 spray into both nostrils daily. Patient taking differently: Place 1 spray into both nostrils daily as needed for allergies. 05/02/22   Georgette Shell, MD  Fluticasone-Umeclidin-Vilant (TRELEGY ELLIPTA) 200-62.5-25 MCG/ACT AEPB INHALE 1 PUFF INTO THE LUNGS DAILY. Patient taking differently: Inhale 1 puff into the lungs daily as needed (for shortness of breath). 05/21/22   Biagio Borg, MD  furosemide (LASIX) 20 MG tablet Take 1 tablet (20 mg total) by mouth daily. 09/17/22 09/17/23  Rigoberto Noel, MD  furosemide (LASIX) 40 MG tablet Take 1 tablet (40 mg total) by mouth daily. 09/17/22   Rigoberto Noel, MD  ipratropium-albuterol (DUONEB) 0.5-2.5 (3) MG/3ML SOLN Take 3 mLs by nebulization every 6 (six) hours as needed. Patient taking differently: Take 3 mLs by nebulization every 6 (six) hours as needed (for shortness of breath). 11/05/21   Rigoberto Noel, MD  levothyroxine (SYNTHROID) 50 MCG tablet Take 1 tablet (50 mcg total) by mouth daily. 08/29/22   Shamleffer, Melanie Crazier, MD  loratadine (CLARITIN) 10 MG tablet Take 1 tablet (10 mg total) by mouth daily. Patient taking differently: Take 10 mg by mouth daily as needed for allergies. 05/02/22   Georgette Shell, MD  montelukast (SINGULAIR) 10 MG tablet Take 1 tablet (10 mg total) by mouth at bedtime. 07/21/22   Danford, Suann Larry, MD  OXYGEN Inhale 4 L into the lungs continuous.    [provider]  potassium chloride (KLOR-CON M) 10 MEQ tablet Take 1 tablet (10 mEq total) by mouth daily. 06/30/22   Biagio Borg, MD  predniSONE (STERAPRED UNI-PAK 21 TAB) 10 MG (21) TBPK tablet Please use per package instruction 09/04/22   Elgergawy, Silver Huguenin, MD     Critical care time: 43 minutes    Magdalen Spatz, MSN, AGACNP-BC [place] for personal pager PCCM on call pager [phone]  10/13/2022 6:13 PM

## 2022-10-13 NOTE — ED Provider Notes (Signed)
Pt signed out by Dr. Rogene Houston pending repeat ABG.  Repeat ABG is not improved. However, she is a little more alert.  Pt is now opening eyes.  RR on Bipap only 12, so I have asked RT to increase rate.  I did discuss pt with CCM who did see pt in the ED and will admit.  I got ahold of Willene Hatchet [facility]) who is pt's niece.  She will talk about intubation with the family, but do not want to go there yet.     Isla Pence, MD 10/13/22 1726

## 2022-10-13 NOTE — ED Notes (Signed)
Patient observed to be satting at 87% on bipap. RN called respiratory therapy to confirm increasing FiO2 and they agreed. Patient FiO2 increased to 60% and head of bed elevated. Patient O2 increased to 92%

## 2022-10-13 NOTE — ED Notes (Signed)
Redrawing chem 8 and VBG d/t equipment/sample malfunction causing results to not show in chart.

## 2022-10-13 NOTE — Progress Notes (Addendum)
eLink Physician-Brief Progress Note Patient Name: Jasmine [name] DOB: 07/22/1946 MRN: [id number]   Date of Service  10/13/2022  HPI/Events of Note  Patient admitted for COPD flare. Has just arrived in ICU on BIPAP on 100%, peep 5, RR 22, PC 14. No distress. O2 sat is 100. Fio2 to be weaned. Looks comfortable on camera. Follows commands per Charity fundraiser.   eICU Interventions  ABG has been ordered. Please let me know when resulted. Currently listed full code.      Intervention Category Major Interventions: Respiratory failure - evaluation and management Evaluation Type: New Patient Evaluation  Oretha Milch 10/13/2022, 7:57 PM  Addendum at 10:45 pm -  Notified of ABG BIPAP settings changed D/w RT Patient relatively unchanged. No distress on bipap  I called and spoke with POA Jasmine [name] (niece) at [phone] Detailed discussion held with her Her daughter who is an occupational therapist was also there Patient requested ETT if absolutely needed and has told her as well as the hospice the same thing Explained that the situation is difficult and that her advanced disease makes it tough to get off the machine They understand, thank the hospital for their help and request to try and avoid intubation as much as possible but know that she very well may need it I gave Jasmine [name] the direct line to the ICU as well  Recheck abg at 1 am  Addendum at 5:15 am Urinary retention. I/o cath ordered Also noted prior ABG had improved Mechanics on bipap machine look good. Continue with current settings

## 2022-10-13 NOTE — ED Triage Notes (Signed)
Pt arrived to ED via EMS from home w/ c/o AMS. Pt is lethargic upon arrival. Per family on scene pt was "slow moving" yesterday and "acting sick". Pt's husband went to bed at 2100 and woke up around 0800 and noted pt was not responding to him. Pt normally A&Ox4 and ambulatory. Pt is an [facility] pt. No DNR or MOST form per family. VSS w/ EMS. Pt wears 4L via [place] continuously, hx of CHF, HTN, Afib, Hypercapnia.

## 2022-10-13 NOTE — Progress Notes (Signed)
RT gave critical ABG results to Dr. Gilford Raid. No orders were given for any changes. Due to increased CO2 from ABG, RT sent ABG sample to main lab.

## 2022-10-13 NOTE — ED Notes (Signed)
VBG still not reading, EDP notified

## 2022-10-14 ENCOUNTER — Inpatient Hospital Stay (HOSPITAL_COMMUNITY)

## 2022-10-14 ENCOUNTER — Encounter (HOSPITAL_COMMUNITY): Payer: Self-pay | Admitting: Pulmonary Disease

## 2022-10-14 DIAGNOSIS — J9622 Acute and chronic respiratory failure with hypercapnia: Secondary | ICD-10-CM | POA: Diagnosis not present

## 2022-10-14 DIAGNOSIS — Z66 Do not resuscitate: Secondary | ICD-10-CM

## 2022-10-14 DIAGNOSIS — Z515 Encounter for palliative care: Secondary | ICD-10-CM | POA: Diagnosis not present

## 2022-10-14 DIAGNOSIS — J9602 Acute respiratory failure with hypercapnia: Secondary | ICD-10-CM | POA: Diagnosis not present

## 2022-10-14 LAB — BASIC METABOLIC PANEL
Anion gap: 12 (ref 5–15)
BUN: 21 mg/dL (ref 8–23)
CO2: 41 mmol/L — ABNORMAL HIGH (ref 22–32)
Calcium: 9.4 mg/dL (ref 8.9–10.3)
Chloride: 92 mmol/L — ABNORMAL LOW (ref 98–111)
Creatinine, Ser: 0.82 mg/dL (ref 0.44–1.00)
GFR, Estimated: >60 mL/min (ref >60)
Glucose, Bld: 130 mg/dL — ABNORMAL HIGH (ref 70–99)
Potassium: 5.2 mmol/L — ABNORMAL HIGH (ref 3.5–5.1)
Sodium: 145 mmol/L (ref 135–145)

## 2022-10-14 LAB — CBC
HCT: 40.4 % (ref 36.0–46.0)
Hemoglobin: 11.4 g/dL — ABNORMAL LOW (ref 12.0–15.0)
MCH: 30.8 pg (ref 26.0–34.0)
MCHC: 28.2 g/dL — ABNORMAL LOW (ref 30.0–36.0)
MCV: 109.2 fL — ABNORMAL HIGH (ref 80.0–100.0)
Platelets: 114 10*3/uL — ABNORMAL LOW (ref 150–400)
RBC: 3.7 MIL/uL — ABNORMAL LOW (ref 3.87–5.11)
RDW: 13.2 % (ref 11.5–15.5)
WBC: 3.7 10*3/uL — ABNORMAL LOW (ref 4.0–10.5)
nRBC: 0 % (ref 0.0–0.2)

## 2022-10-14 LAB — TSH: TSH: 0.27 u[IU]/mL — ABNORMAL LOW (ref 0.350–4.500)

## 2022-10-14 LAB — BLOOD GAS, ARTERIAL
Acid-Base Excess: 22 mmol/L — ABNORMAL HIGH (ref 0.0–2.0)
Bicarbonate: 54.1 mmol/L — ABNORMAL HIGH (ref 20.0–28.0)
FIO2: 50 %
O2 Saturation: 99.7 %
Patient temperature: 37
pCO2 arterial: 105 mmHg (ref 32–48)
pH, Arterial: 7.32 — ABNORMAL LOW (ref 7.35–7.45)
pO2, Arterial: 135 mmHg — ABNORMAL HIGH (ref 83–108)

## 2022-10-14 LAB — GLUCOSE, CAPILLARY
Glucose-Capillary: 108 mg/dL — ABNORMAL HIGH (ref 70–99)
Glucose-Capillary: 116 mg/dL — ABNORMAL HIGH (ref 70–99)

## 2022-10-14 LAB — PHOSPHORUS: Phosphorus: 2.9 mg/dL (ref 2.5–4.6)

## 2022-10-14 LAB — MAGNESIUM: Magnesium: 1.7 mg/dL (ref 1.7–2.4)

## 2022-10-14 MED ORDER — ASPIRIN 81 MG PO CHEW: 81.0000 mg | CHEWABLE_TABLET | Freq: Every day | ORAL | Status: DC

## 2022-10-14 MED ORDER — BUDESONIDE 0.5 MG/2ML IN SUSP: 0.5000 mg | Freq: Two times a day (BID) | RESPIRATORY_TRACT | Status: DC

## 2022-10-14 MED ORDER — METHYLPREDNISOLONE SODIUM SUCC 40 MG IJ SOLR: 40.0000 mg | INTRAMUSCULAR | 0 refills | Status: DC

## 2022-10-14 MED ORDER — LEVOTHYROXINE SODIUM 50 MCG PO TABS: 50.0000 ug | ORAL_TABLET | Freq: Every day | ORAL | Status: DC

## 2022-10-14 MED ORDER — AZITHROMYCIN 250 MG PO TABS: ORAL_TABLET | ORAL | 0 refills | Status: AC

## 2022-10-14 MED ORDER — DEXTROSE 5 % IV SOLN
250.0000 mg | INTRAVENOUS | Status: DC
  Filled 2022-10-14: qty 2.5

## 2022-10-14 MED ORDER — MORPHINE SULFATE (CONCENTRATE) 10 MG/0.5ML PO SOLN: 2.5000 mg | ORAL | 0 refills | Status: DC | PRN

## 2022-10-14 MED ORDER — DEXTROSE 5 % IV SOLN
250.0000 mg | INTRAVENOUS | Status: DC
  Administered 2022-10-14: 250 mg via INTRAVENOUS
  Filled 2022-10-14: qty 2.5

## 2022-10-14 MED ORDER — HALOPERIDOL LACTATE 5 MG/ML IJ SOLN
INTRAMUSCULAR | Status: AC
  Filled 2022-10-14: qty 1

## 2022-10-14 MED ORDER — INSULIN ASPART 100 UNIT/ML IJ SOLN: 0.0000 [IU] | INTRAMUSCULAR | 11 refills | Status: DC

## 2022-10-14 MED ORDER — METHYLPREDNISOLONE SODIUM SUCC 40 MG IJ SOLR: 40.0000 mg | INTRAMUSCULAR | Status: DC

## 2022-10-14 MED ORDER — SODIUM ZIRCONIUM CYCLOSILICATE 10 G PO PACK: 10.0000 g | PACK | Freq: Once | ORAL | Status: DC

## 2022-10-14 MED ORDER — FUROSEMIDE 10 MG/ML IJ SOLN
20.0000 mg | Freq: Once | INTRAMUSCULAR | Status: AC
  Administered 2022-10-14: 20 mg via INTRAVENOUS
  Filled 2022-10-14: qty 2

## 2022-10-14 MED ORDER — CITALOPRAM HYDROBROMIDE 10 MG PO TABS: 20.0000 mg | ORAL_TABLET | Freq: Every day | ORAL | Status: DC

## 2022-10-14 MED ORDER — MORPHINE SULFATE (CONCENTRATE) 10 MG/0.5ML PO SOLN
2.5000 mg | ORAL | Status: DC | PRN
  Administered 2022-10-14: 5 mg via ORAL
  Filled 2022-10-14: qty 0.5

## 2022-10-14 MED ORDER — HALOPERIDOL LACTATE 5 MG/ML IJ SOLN
5.0000 mg | Freq: Once | INTRAMUSCULAR | Status: AC
  Administered 2022-10-14: 5 mg via INTRAVENOUS

## 2022-10-14 NOTE — TOC Initial Note (Signed)
Transition of Care [facility]) - Initial/Assessment Note    Patient Details  Name: Jasmine [name] MRN: [id number] Date of Birth: 1946/06/23  [facility] CM/SW Contact:    Benard Halsted, LCSW Phone Number: 10/14/2022, 1:33 PM  Clinical Narrative:                 Patient discharging to [facility] today. Will arrange [facility] for transport.   Expected Discharge Plan: [place] Barriers to Discharge: Barriers Resolved   Patient Goals and CMS Choice Patient states their goals for this hospitalization and ongoing recovery are:: Comfort CMS Medicare.gov Compare Post Acute Care list provided to:: Patient Represenative (must comment) Choice offered to / list presented to : [place] / [place] ownership interest in [facility].provided to:: [facility] POA / Guardian    Expected Discharge Plan and Services In-house Referral: Clinical Social Work   Post Acute Care Choice: Hospice Living arrangements for the past 2 months: Single Family Home                                      Prior Living Arrangements/Services Living arrangements for the past 2 months: Single Family Home Lives with:: Other (Comment) Patient language and need for interpreter reviewed:: Yes Do you feel safe going back to the place where you live?: Yes      Need for Family Participation in Patient Care: Yes (Comment) Care giver support system in place?: Yes (comment)   Criminal Activity/Legal Involvement Pertinent to Current Situation/Hospitalization: No - Comment as needed  Activities of Daily Living      Permission Sought/Granted Permission sought to share information with : Facility Sport and exercise psychologist, Family Supports Permission granted to share information with : No     Permission granted to share info w AGENCY: Hospice  Permission granted to share info w Relationship: POA     Emotional Assessment Appearance:: Appears stated  age Attitude/Demeanor/Rapport: Unable to Assess Affect (typically observed): Unable to Assess Orientation: :  (Disoriented) Alcohol / Substance Use: Not Applicable Psych Involvement: No (comment)  Admission diagnosis:  Respiratory failure with hypercapnia (HCC) [J96.92] Acute on chronic respiratory failure with hypercapnia (HCC) [J96.22] Patient Active Problem List   Diagnosis Date Noted   Respiratory failure with hypercapnia ([place]) 10/13/2022   Controlled type 2 diabetes mellitus without complication, without long-term current use of insulin ([place]) 08/27/2022   Hyperkalemia 07/21/2022   Hyperlipidemia 07/21/2022   Vitamin D deficiency 03/01/2022   Physical deconditioning 12/05/2021   COPD exacerbation ([place]) 07/30/2021   Diabetes mellitus due to underlying condition without complication, without long-term current use of insulin ([place]) 07/22/2021   Steroid-dependent chronic obstructive pulmonary disease ([place]) 07/22/2021   Postablative hypothyroidism 07/22/2021   Encounter for well adult exam with abnormal findings 04/20/2021   Malnutrition of moderate degree 08/16/2019   Goals of care, counseling/discussion 09/20/2018   Edema due to congestive heart failure ([place]) 07/27/2018   Palliative care by specialist    Shortness of breath    Chronic low back pain 04/30/2018   COPD with acute exacerbation (HCC)    Hypothyroidism    Anxiety    Hypercarbia 03/22/2018   Polysubstance abuse ([place]) 01/26/2018   Aspiration into airway 06/03/2017   Rash 07/12/2016   Bilateral hearing loss 06/05/2016   Hyperglycemia 05/21/2016   Lung nodules 03/24/2016   Abnormal LFTs 03/24/2016   Solitary pulmonary nodule 01/09/2016  Acute on chronic respiratory failure with hypoxia (HCC)    On home oxygen therapy    Chronic diastolic congestive heart failure ([place])    Bacteremia Step vir[place]ans 12/27/2015   Acute on chronic respiratory failure with hypoxia and hypercapnia ([place]) 12/25/2015   Former smoker  12/25/2015   Hypothyroidism following radioiodine therapy 09/26/2014   Protein-calorie malnutrition, severe ([place]) 06/23/2014   Cor pulmonale ([place]) 01/12/2013   COPD (chronic obstructive pulmonary disease) ([place]) 11/29/2011   Macrocytosis without anemia 11/09/2011   Palliative care encounter 04/09/2011   SINUSITIS, CHRONIC 10/10/2010   Fatigue 10/10/2010   Depression with anxiety 07/21/2008   Hyperthyroidism 11/23/2007   Essential hypertension 05/01/2007   ALLERGIC RHINITIS 05/01/2007   Asthma 05/01/2007   OSTEOPOROSIS 05/01/2007   PCP:  Biagio Borg, MD Pharmacy:   [facility] [facility], [place] [address] Phone: [phone] Fax: [phone]  [facility] [address] [address] Phone: [phone] Fax: [place] [address] Phone: [phone] Fax: [phone]  [facility] [place], [place] [address] Phone: [phone] Fax: [phone]     Social Determinants of Health (SDOH) Social History: [place]: No Food Insecurity (09/02/2022)  Housing: Low Risk  (09/02/2022)  Transportation Needs: No Transportation Needs (09/02/2022)  Utilities: Not At Risk (09/02/2022)  Alcohol Screen: Low Risk  (01/27/2022)  Depression (PHQ2-9): Low Risk  (07/31/2022)  Financial Resource Strain: Low Risk  (01/27/2022)  Physical Activity: Inactive (01/27/2022)  Social Connections: Moderately Isolated (01/27/2022)  Stress: No Stress Concern Present (01/27/2022)  Tobacco Use: Medium Risk (10/14/2022)   SDOH Interventions:     Readmission Risk Interventions    10/14/2022    1:32 PM 03/02/2020    3:30 PM  Readmission Risk Prevention Plan  Transportation Screening Complete Complete  HRI or Home Care Consult   Complete  Palliative Care Screening  Not Applicable  Medication Review (RN Care Manager) Referral to Pharmacy Referral to Pharmacy  PCP or Specialist appointment within 3-5 days of discharge Complete   HRI or [place] Complete   SW Recovery Care/Counseling Consult Complete   Palliative Care Screening Complete   [place] Not Applicable

## 2022-10-14 NOTE — Discharge Summary (Signed)
Physician Discharge Summary   Patient ID: Jasmine [name] MRN: [id number] DOB/AGE: [birth year] 77 y.o.  Admit date: 10/13/2022 Discharge date: 10/14/2022                     Discharge Plan by Diagnosis   AMS - initially likely 2/2 hypercarbia. Unclear what her baselines status is but concern for some underlying dementia. Acute on Chronic Hypercarbic Respiratory Failure - on 4L home O2 and unsure whether she is compliant with nocturnal NIV. COPD/ Centrilobar Emphysema with acute  exacerbation. - Discharge to [facility] for symptom management and to try optimize for home with hopes of prevent recurrent hospitalizations. - Off BiPAP now, continue on [place] O2 for sats > 88%. - Nocturnal BiPAP mandatory (hx of non-compliance per family). - Reduced Solumedrol from 15m BID to 469mdaily and try get back to Prednisone 1037maily (unclear whether she was actually taking this daily but appears to be last prescribed dose). - Continue BD's. - Empiric Azithromycin x 5 days through 12/30.   Agitated delirium - required 5mg59mldol earlier for acute agitation, now resolved. - Supportive care. - Avoid sedatives. - Frequent re-orientation.  Pulmonary nodules - stable. - F/u as outpatient.   Hyperkalemia - s/p Lokelma and Lasix. - Needs a follow up BMP.   Hx Hypothyroidism - TSH pending. - Continue home Synthroid.   Hx Depression, Anxiety. - Continue home Celexa, ASA.    Discharge Summary   Jasmine HOFFa 76 y33. y/o female with a PMH of severe COPD (FEV1 30%) with chronic hypoxic and hypercarbic respiratory failure on 4L chronic O2 and supposed to be on nocturnal NIV; however, is non-compliant. She is followed by Dr. AlvaElsworth Sohoan outpatient. [facility] with AMS and hypersomnolence. She was found to have AoC hypoxic and hypercapnic respiratory failure with initial ABG 7.23 / >123 / 94 ( on 4L O2). She was placed on BiPAP and was admitted for AECOPD.  On 12/26, her mental  status was much improved. BiPAP was removed and pt tolerated well. Shortly thereafter, she had an episode of acute agitation and delirium requiring 5mg 73mdol to prevent self harm and allow nursing to provide safe care. She responded well and rested comfortably after this was administered.  Family including niece who is POA ([employer] arrived at the bedside. I updated them on pt's status and condition. We also reviewed pt's history and recent hospitalizations. I reviewed that she had recently been enrolled in outpatient hospice care and we discussed whether that had been any talks of DNR orders in the event of cardiac or respiratory arrest. Niece then called pts husband, DonalElenore Rotadiscussed with him further. I personally spoke with both of them and after extensive discussion, they informed me that due to the severity of pt's COPD/lung disease, they would prefer pt to be listed as DNR/DNI as she would not want prolonged life support measures in the event of an arrest.  [facility] then visited pt at bedside and after discussion with niece and husband over conference call, [facility] her symptom burden acutely with the ultimate plan to discharge home in the near future.  On afternoon of 12/26, she was deemed medically stable and was [facility]      Significant Hospital Events   12/25 admit. 12/26 discharge.  Significant Diagnostic Studies  CT head 12/25 > negative.  Micro Data  Blood 12/25 >  COVID/Flu/RSV 12/25 > neg.  Antimicrobials  Azithromycin 12/25 >    Objective:  Blood pressure (!) 113/57, pulse 91, temperature (!) 97.5 F (36.4 C), temperature source Axillary, resp. rate 19, height 5' (1.524 m), weight 55.9 kg, SpO2 100 %.    Vent Mode: BIPAP FiO2 (%):  [40 %-50 %] 40 % Set Rate:  [22 bmp-30 bmp] 30 bmp PEEP:  [5 cmH20] 5 cmH20   Intake/Output Summary (Last 24 hours) at  10/14/2022 1343 Last data filed at 10/14/2022 1200 Gross per 24 hour  Intake 2172.05 ml  Output 375 ml  Net 1797.05 ml   Filed Weights   10/13/22 0945 10/14/22 0500  Weight: 58 kg 55.9 kg    Physical Examination: General: Elderly female, in NAD. HENT:  EOMI. MMM. Lungs:  Normal effort, CTAB. Cardiovascular:  RRR, no M/R/G. Abdomen:  Soft, NT, ND, BS +. Extremities:  No deformities, no edema. Neuro:  Awake, MAE's, follows commands, speaks non-sensible sentences.   Discharge Labs:  BMET Recent Labs  Lab 10/13/22 1017 10/14/22 0609  NA 141 145  K 4.8 5.2*  CL 86* 92*  CO2 44* 41*  GLUCOSE 127* 130*  BUN 17 21  CREATININE 0.67 0.82  CALCIUM 9.6 9.4  MG  --  1.7  PHOS  --  2.9    CBC Recent Labs  Lab 10/13/22 1017 10/14/22 0609  HGB 12.1 11.4*  HCT 41.8 40.4  WBC 5.3 3.7*  PLT 129* 114*    Anti-Coagulation No results for input(s): "INR" in the last 168 hours.        Allergies as of 10/14/2022       Reactions   Fosamax [alendronate Sodium] Other (See Comments)   Pt does not remember reaction        Medication List     STOP taking these medications    predniSONE 10 MG (21) Tbpk tablet Commonly known as: STERAPRED UNI-PAK 21 TAB       TAKE these medications    acetaminophen 325 MG tablet Commonly known as: TYLENOL Take 2 tablets (650 mg total) by mouth every 6 (six) hours as needed for mild pain (or Fever >/= 101).   albuterol 108 (90 Base) MCG/ACT inhaler Commonly known as: VENTOLIN HFA INHALE 1 TO 2 PUFFS INTO THE LUNGS EVERY 6 HOURS AS NEEDED FOR WHEEZING OR SHORTNESS OF BREATH What changed: See the new instructions.   aspirin EC 81 MG tablet Take 1 tablet (81 mg total) by mouth daily.   atorvastatin 10 MG tablet Commonly known as: LIPITOR TAKE 1 TABLET (10 MG TOTAL) BY MOUTH DAILY. What changed:  how much to take how to take this when to take this additional instructions   azithromycin 250 MG tablet Commonly known  as: Zithromax Z-Pak Take 1 tablet (250 mg) once daily through 12/30   citalopram 20 MG tablet Commonly known as: CELEXA TAKE ONE TABLET BY MOUTH DAILY What changed:  how much to take how to take this when to take this additional instructions   docusate sodium 100 MG capsule Commonly known as: Colace Take 1 capsule (100 mg total) by mouth 2 (two) times daily.   fluticasone 50 MCG/ACT nasal spray Commonly known as: FLONASE Place 1 spray into both nostrils daily. What changed:  when to take this reasons to take this   furosemide 20 MG tablet Commonly known as: Lasix Take 1 tablet (20 mg total) by mouth  daily. What changed: Another medication with the same name was removed. Continue taking this medication, and follow the directions you see here.   insulin aspart 100 UNIT/ML injection Commonly known as: novoLOG Inject 0-9 Units into the skin every 4 (four) hours.   ipratropium-albuterol 0.5-2.5 (3) MG/3ML Soln Commonly known as: DUONEB Take 3 mLs by nebulization every 6 (six) hours as needed. What changed: reasons to take this   levothyroxine 50 MCG tablet Commonly known as: SYNTHROID Take 1 tablet (50 mcg total) by mouth daily.   loratadine 10 MG tablet Commonly known as: CLARITIN Take 1 tablet (10 mg total) by mouth daily. What changed:  when to take this reasons to take this   methylPREDNISolone sodium succinate 40 mg/mL injection Commonly known as: SOLU-MEDROL Inject 1 mL (40 mg total) into the vein daily. Start taking on: October 15, 2022   montelukast 10 MG tablet Commonly known as: SINGULAIR Take 1 tablet (10 mg total) by mouth at bedtime.   morphine CONCENTRATE 10 MG/0.5ML Soln concentrated solution Take 0.13-0.25 mLs (2.6-5 mg total) by mouth every 2 (two) hours as needed for moderate pain, anxiety or shortness of breath (EOL care).   OXYGEN Inhale 4 L into the lungs continuous.   potassium chloride 10 MEQ tablet Commonly known as: KLOR-CON M Take  1 tablet (10 mEq total) by mouth daily.   Trelegy Ellipta 200-62.5-25 MCG/ACT Aepb Generic drug: Fluticasone-Umeclidin-Vilant INHALE 1 PUFF INTO THE LUNGS DAILY. What changed:  when to take this reasons to take this         Disposition: [place]   Discharge Condition:  DEYCI GESELL has met maximum benefit of inpatient care and is medically stable and cleared for discharge.  Patient is pending follow up as above.     Time spent on discharge: Greater than 25 minutes.    Montey Hora, [place] [facility] For pager details, please see AMION or use Epic chat  After 1900, please call [facility] for cross coverage needs 10/14/2022, 1:44 PM

## 2022-10-14 NOTE — TOC Transition Note (Signed)
Transition of Care [facility]) - CM/SW Discharge Note   Patient Details  Name: Jasmine [name] MRN: [id number] Date of Birth: 1946-04-28  [facility] CM/SW Contact:  Benard Halsted, LCSW Phone Number: 10/14/2022, 2:32 PM   Clinical Narrative:    Patient will DC to: [facility] Anticipated DC date: 10/14/22 Family notified: Niece Transport by: [facility]   Per MD patient ready for DC to [facility]. RN to call report prior to discharge. RN, patient, patient's family, and facility notified of DC. Discharge Summary sent to facility. DC packet on chart including signed DNR. Ambulance transport requested for patient.   CSW will sign off for now as social work intervention is no longer needed. Please consult [place] again if new needs arise.     Final next level of care: [place] Barriers to Discharge: Barriers Resolved   Patient Goals and CMS Choice CMS Medicare.gov Compare Post Acute Care list provided to:: Patient Represenative (must comment) Choice offered to / list presented to : [place] / [place]  Discharge Placement                Patient chooses bed at:  [facility]) Patient to be transferred to facility by: [facility] Name of family member notified: Niece Patient and family notified of of transfer: 10/14/22  Discharge Plan and Services Additional resources added to the After Visit Summary for   In-house Referral: Clinical Social Work   Post Acute Care Choice: Hospice                               Social Determinants of Health ([place]) Interventions SDOH Screenings   Food Insecurity: No Food Insecurity (09/02/2022)  Housing: Low Risk  (09/02/2022)  Transportation Needs: No Transportation Needs (09/02/2022)  Utilities: Not At Risk (09/02/2022)  Alcohol Screen: Low Risk  (01/27/2022)  Depression (PHQ2-9): Low Risk  (07/31/2022)  Financial Resource Strain: Low Risk  (01/27/2022)  Physical Activity: Inactive (01/27/2022)  Social  Connections: Moderately Isolated (01/27/2022)  Stress: No Stress Concern Present (01/27/2022)  Tobacco Use: Medium Risk (10/14/2022)     Readmission Risk Interventions    10/14/2022    1:32 PM 03/02/2020    3:30 PM  Readmission Risk Prevention Plan  Transportation Screening Complete Complete  HRI or Home Care Consult  Complete  Palliative Care Screening  Not Applicable  Medication Review (RN Care Manager) Referral to Pharmacy Referral to Pharmacy  PCP or Specialist appointment within 3-5 days of discharge Complete   HRI or [place] Complete   SW Recovery Care/Counseling Consult Complete   Palliative Care Screening Complete   [place] Not Applicable

## 2022-10-14 NOTE — Progress Notes (Signed)
NAME:  Jasmine [name], MRN:  [id number], DOB:  [birth year], LOS: 1 ADMISSION DATE:  10/13/2022, CONSULTATION DATE:  10/13/2022 REFERRING MD:   Gilford Raid, CHIEF COMPLAINT:  Hypercarbic respiratory failure   History of Present Illness:  76 year old female former smoker with Severe COPD ( FEV1 30%) with chronic hypoxic and hypercarbic respiratory failure, on 4L home oxygen and nocturnal NIV (followed by Dr. Elsworth Soho), brought in to [facility] ED 10/13/2022 by EMS with acute on chronic respiratory failure due to COPD exacerbation. Pt. has had recent admissions for the same diagnosis7/2023,  06/2022 and 08/2022 .  Family state patient was lethargic 12/24 pm . When they went to wake her up 12/25 am she was unresponsive. Blood Sugars and CBG's were WNL.   Upon arrival to ED Oxygen sats on 4 L were 100%, patient was not arousable, but was maintaining her airway. . Initial ABG was 7.23/>123/94/57.6. Patient was placed on BiPAP at rate of 12 as when she had previous admission 08/2022 she improved with BiPAP therapy. Repeat ABG 4 hours later was 7.24/> 123/142/56.9, and after that actually worsened with pH reported at 7.1 and CO2 > 100. ( This did not cross over into EPIC but  was verbally reported by Dr. Gilford Raid.) Patient has baseline CO2 of 60's Labs In the ED troponin >> 6 then 7, BNP 30.8, Lactate 0.6 WBC was 5.3, HGB 12.1, platelets 129 Na 141, K 4.8, Cl 86, CO2 44, Glucose 127, BUN 17, Creatinine 0.67, Calcium 9.6, Albumin 3.8  Pt. Has refused intubation in the past. She is currently followed by Hospice, but no DNR has been completed.  Niece, [name], is her POA .  PCCM have been asked to assist with care as patient will need ICU admission on BiPAP and close ICU monitoring as high risk for intubation,  Pertinent  Medical History   Past Medical History:  Diagnosis Date   ALLERGIC RHINITIS 05/01/2007   Anxiety    Arthritis    "hands" (12/28/2015)   ASTHMA 05/01/2007   "since I was a child"   Cancer ([place])     LUNG   CHF (congestive heart failure) ([place])    Chronic bronchitis ([place])    COPD (chronic obstructive pulmonary disease) ([place])    DEPRESSION 07/21/2008   DVT (deep venous thrombosis) ([place]) 1960s   LLE   FATIGUE 10/10/2010   HYPERTENSION 05/01/2007   HYPOTHYROIDISM 08/23/2009   s/p RAI   LUMBAR RADICULOPATHY, RIGHT 08/25/2008   On home oxygen therapy    "3-4L; qd; all the time" (12/28/2015)   OSTEOPOROSIS 05/01/2007   SHOULDER PAIN, LEFT 08/23/2009   SINUSITIS, CHRONIC 10/10/2010     Significant Hospital Events: Including procedures, antibiotic start and stop dates in addition to other pertinent events   10/13/2022 Admission to [facility] , BiPAP  Interim History / Subjective:  Awake, agitated on BiPAP. Taken off by RT and I examined pt immediately thereafter She is awake, follows commands; however, is confused and speaks non-sensible sentences. Calmer after hand mittens were removed and now watching TV.   Objective   Blood pressure 130/69, pulse 76, temperature 98.9 F (37.2 C), temperature source Axillary, resp. rate (!) 25, height 5' (1.524 m), weight 55.9 kg, SpO2 100 %.    Vent Mode: BIPAP FiO2 (%):  [40 %-60 %] 40 % Set Rate:  [12 bmp-30 bmp] 30 bmp PEEP:  [5 cmH20] 5 cmH20   Intake/Output Summary (Last 24 hours) at 10/14/2022 5035 Last data filed at 10/14/2022 0600 Gross per  24 hour  Intake 1916.55 ml  Output 275 ml  Net 1641.55 ml    Filed Weights   10/13/22 0945 10/14/22 0500  Weight: 58 kg 55.9 kg    Examination: General: Elderly female, in NAD HENT:  EOMI. MMM. Lungs:  Normal effort, CTAB. Cardiovascular:  RRR, no M/R/G. Abdomen:  Soft, NT, ND, BS + Extremities:  No deformities, no edema. Neuro:  Awake, MAE's, follows commands, speaks non-sensible sentences. GU: NA  Assessment & Plan:   AMS - initially likely 2/2 hypercarbia. Unclear what her baselines status is but concern for some underlying dementia. Acute on Chronic Hypercarbic Respiratory Failure - on  4L home O2 and unsure whether she is compliant with nocturnal NIV. COPD/ Centrilobar Emphysema with acute  exacerbation. - Off BiPAP now, continue on [place] O2 for sats > 88%. - Nocturnal BiPAP mandatory, will need to clarify her compliance as outpatient with family. - Goals of care discussions needed, she is followed by hospice as outpatient. - Reduced Solumedrol from '60mg'$  BID to '40mg'$  daily and try get back to Prednisone '10mg'$  daily (unclear whether she was actually taking this daily but appears to be last prescribed dose). - Scheduled and prn BD, the transition back to Trelegy at DC. - Empiric Azithromycin OK.  Agitated delirium - required '5mg'$  Haldol earlier for acute agitation, now resolved. - Supportive care. - Avoid sedatives. - Frequent re-orientation. - Try to get her out of the ICU.  Pulmonary nodules - stable. - F/u as outpatient.  Hyperkalemia. - 10g Lokelma now. - '20mg'$  Lasix x 1. - Follow BMP.  Hx Hypothyroidism. - Continue home Synthroid. - Check TSH.  Hx Depression, Anxiety. - Resume home Celexa, ASA.  Goals of Care - followed by [facility] palliative care as outpatient to try and control her symptom burden and prevent hospitalizations etc, but she is not ready for hospice or DNR per last notes but has had multiple admission since last inpatient palliative care discussions. - Will try to call family for further discussions. - Will ask palliative care to see given her enrollment in palliative care as outpatient already.  Best Practice (right click and "Reselect all SmartList Selections" daily)   Diet/type: NPO DVT prophylaxis: prophylactic heparin  GI prophylaxis: N/A Lines: N/A Foley:  Yes, and it is still needed Code Status:  full code Last date of multidisciplinary goals of care discussion [Pending Niece [name]  is POA]   Critical care time: 30 minutes    Montey Hora, [place] - [facility] [place] [facility] For pager details, please see AMION or use Epic  chat  After 1900, please call [place] for cross coverage needs 10/14/2022, 9:07 AM

## 2022-10-14 NOTE — Progress Notes (Signed)
Nutrition Brief Note  Chart reviewed. Pt now transitioning to comfort care.  No further nutrition interventions planned at this time.  Please re-consult as needed.   Lockie Pares., RD, LDN, CNSC See AMiON for contact information

## 2022-10-14 NOTE — Consult Note (Signed)
Consultation Note Date: 10/14/2022   Patient Name: Jasmine [name]  DOB: Jan 14, 1946  MRN: [id number]  Age / Sex: 76 y.o., female  PCP: Biagio Borg, MD Referring Physician: Juanito Doom, MD  Reason for Consultation: Establishing goals of care and Inpatient hospice referral  HPI/Patient Profile: 76 y.o. female  with past medical history of acute on chronic respiratory failure, severe COPD with nighttime BiPAP/home oxygen, history of lung cancer, heart failure, anxiety/depression, OA in the hands, osteoporosis, history of DVT, HTN, recently moved to hospice care, admitted on 10/13/2022 with altered mental status likely secondary to hypercarbia, COPD exacerbation.   Clinical Assessment and Goals of Care: I have reviewed medical records including EPIC notes, labs and imaging, received report from RN, assessed the patient.  Mrs. Mcclimans is sitting up in bed.  She appears acutely/chronically ill and very frail.  She does not bond in any meaningful way to voice or touch.  She clearly cannot make her needs known.  There is no family at bedside at this time.    Face-to-face discussion with [facility] representative  Ennis, RN, who is talking with family.  Family is electing comfort and dignity at end-of-life, transfer to residential hospice/[facility].  Symptom management needs addressed.  Advanced directives, concepts specific to code status, DNR.   Hospice and Palliative Care services have been following Mrs. Sheeran since approximately August 2019.  She recently transition from palliative to hospice services.  ACC has built a trusting relationship with Mrs. Dunsworth and her family.    Discussed the importance of continued conversation with family and the medical providers regarding overall plan of care and treatment options, ensuring decisions are within the context of the patient's values and GOCs.  Questions and concerns  were addressed.  The family was encouraged to call with questions or concerns.  PMT will continue to support holistically.  Conference with CCM NP, bedside nursing staff, transition of care team, inpatient hospice representative related to patient condition, needs, goals of care, disposition.   HCPOA   HCPOA -niece, Willene Hatchet is healthcare surrogate.  She makes choices in conjunction with Mrs. Tuma's former spouse, Sport and exercise psychologist.    SUMMARY OF RECOMMENDATIONS   Requesting comfort and dignity at end-of-life, residential hospice at [place] transfer 12/26   Code Status/Advance Care Planning: DNR   Symptom Management:  By mouth morphine added for comfort  Palliative Prophylaxis:  Frequent Pain Assessment and Oral Care  Additional Recommendations (Limitations, Scope, Preferences): Full Comfort Care  Psycho-social/Spiritual:  Desire for further Chaplaincy support:no Additional Recommendations: Caregiving  Support/Resources and Education on Hospice  Prognosis:  < 2 weeks  Discharge Planning: Hospice facility      Primary Diagnoses: Present on Admission:  Respiratory failure with hypercapnia ([place])   I have reviewed the medical record, interviewed the patient and family, and examined the patient. The following aspects are pertinent.  Past Medical History:  Diagnosis Date   ALLERGIC RHINITIS 05/01/2007   Anxiety    Arthritis    "hands" (12/28/2015)  ASTHMA 05/01/2007   "since I was a child"   Cancer ([place])    LUNG   CHF (congestive heart failure) (HCC)    Chronic bronchitis (H[facility])    COPD (chronic obstructive pulmonary disease) ([place])    DEPRESSION 07/21/2008   DVT (deep venous thrombosis) ([place]) 1960s   LLE   FATIGUE 10/10/2010   HYPERTENSION 05/01/2007   HYPOTHYROIDISM 08/23/2009   s/p RAI   LUMBAR RADICULOPATHY, RIGHT 08/25/2008   On home oxygen therapy    "3-4L; qd; all the time" (12/28/2015)   OSTEOPOROSIS 05/01/2007   SHOULDER PAIN, LEFT  08/23/2009   SINUSITIS, CHRONIC 10/10/2010   Social History   Socioeconomic History   Marital status: Significant Other    Spouse name: Not on file   Number of children: 0   Years of education: Not on file   Highest education level: Not on file  Occupational History   Occupation: RETIRED - Facilities manager: [employer]  Tobacco Use   Smoking status: Former    Packs/day: 1.00    Years: 52.00    Total pack years: 52.00    Types: Cigarettes    Start date: 1965    Quit date: 07/20/2016    Years since quitting: 6.2   Smokeless tobacco: Never  Vaping Use   Vaping Use: Never used  Substance and Sexual Activity   Alcohol use: Not Currently   Drug use: Not Currently   Sexual activity: Not Currently    Partners: Male    Birth control/protection: Post-menopausal  Other Topics Concern   Not on file  Social History Narrative   Lives in [place], [place]. Lives with husband, no children, but husband has 3 kids. Ambulatory, without cane or walker. Smokes cigarettes, currently 1 ppd, smoked for >20-30 yrs. Drinks beer, ~2/day, occasionally liquor, but ? suspect minimizing. Occasional MJ and endorses crack cocaine occasionally   Social Determinants of Health   Financial Resource Strain: Low Risk  (01/27/2022)   Overall Financial Resource Strain (CARDIA)    Difficulty of Paying Living Expenses: Not hard at all  Food Insecurity: No Food Insecurity (09/02/2022)   Hunger Vital Sign    Worried About Running Out of Food in the Last Year: Never true    Ran Out of Food in the Last Year: Never true  Transportation Needs: No Transportation Needs (09/02/2022)   PRAPARE - Hydrologist (Medical): No    Lack of Transportation (Non-Medical): No  Physical Activity: Inactive (01/27/2022)   Exercise Vital Sign    Days of Exercise per Week: 0 days    Minutes of Exercise per Session: 0 min  Stress: No Stress Concern Present (01/27/2022)   [place]    Feeling of Stress : Not at all  Social Connections: Moderately Isolated (01/27/2022)   Social Connection and Isolation Panel [NHANES]    Frequency of Communication with Friends and Family: Twice a week    Frequency of Social Gatherings with Friends and Family: Twice a week    Attends Religious Services: 1 to 4 times per year    Active Member of [employer] or Organizations: No    Attends Archivist Meetings: Never    Marital Status: Widowed   Family History  Problem Relation Age of Onset   Lung cancer Father    Alcohol abuse Brother    Diabetes Brother    Hypertension Other    Stroke Mother  Thyroid disease Mother    Asthma Other        maternal aunts   Breast cancer Neg Hx    Scheduled Meds:  arformoterol  15 mcg Nebulization BID   aspirin  81 mg Oral Daily   budesonide (PULMICORT) nebulizer solution  0.5 mg Nebulization BID   Chlorhexidine Gluconate Cloth  6 each Topical Daily   citalopram  20 mg Oral Daily   heparin  5,000 Units Subcutaneous Q8H   insulin aspart  0-9 Units Subcutaneous Q4H   levothyroxine  50 mcg Oral Q0600   [START ON 10/15/2022] methylPREDNISolone (SOLU-MEDROL) injection  40 mg Intravenous Q24H   mouth rinse  15 mL Mouth Rinse 4 times per day   revefenacin  175 mcg Nebulization Daily   sodium zirconium cyclosilicate  10 g Oral Once   Continuous Infusions:  azithromycin 250 mg (10/14/22 1218)   PRN Meds:.docusate sodium, ipratropium-albuterol, morphine CONCENTRATE, ondansetron (ZOFRAN) IV, mouth rinse, polyethylene glycol Medications Prior to Admission:  Prior to Admission medications   Medication Sig Start Date End Date Taking? Authorizing Provider  acetaminophen (TYLENOL) 325 MG tablet Take 2 tablets (650 mg total) by mouth every 6 (six) hours as needed for mild pain (or Fever >/= 101). 05/01/22   Georgette Shell, MD  albuterol (VENTOLIN HFA) 108 (90 Base) MCG/ACT inhaler INHALE  1 TO 2 PUFFS INTO THE LUNGS EVERY 6 HOURS AS NEEDED FOR WHEEZING OR SHORTNESS OF BREATH Patient taking differently: Inhale 1-2 puffs into the lungs every 6 (six) hours as needed for wheezing or shortness of breath. 08/19/22   Biagio Borg, MD  aspirin EC 81 MG EC tablet Take 1 tablet (81 mg total) by mouth daily. 03/27/16   Velvet Bathe, MD  atorvastatin (LIPITOR) 10 MG tablet TAKE 1 TABLET (10 MG TOTAL) BY MOUTH DAILY. Patient taking differently: Take 10 mg by mouth daily. 08/19/22   Biagio Borg, MD  citalopram (CELEXA) 20 MG tablet TAKE ONE TABLET BY MOUTH DAILY Patient taking differently: Take 20 mg by mouth daily. 07/23/22   Biagio Borg, MD  docusate sodium (COLACE) 100 MG capsule Take 1 capsule (100 mg total) by mouth 2 (two) times daily. 07/31/22   Biagio Borg, MD  fluticasone (FLONASE) 50 MCG/ACT nasal spray Place 1 spray into both nostrils daily. Patient taking differently: Place 1 spray into both nostrils daily as needed for allergies. 05/02/22   Georgette Shell, MD  Fluticasone-Umeclidin-Vilant (TRELEGY ELLIPTA) 200-62.5-25 MCG/ACT AEPB INHALE 1 PUFF INTO THE LUNGS DAILY. Patient taking differently: Inhale 1 puff into the lungs daily as needed (for shortness of breath). 05/21/22   Biagio Borg, MD  furosemide (LASIX) 20 MG tablet Take 1 tablet (20 mg total) by mouth daily. 09/17/22 09/17/23  Rigoberto Noel, MD  furosemide (LASIX) 40 MG tablet Take 1 tablet (40 mg total) by mouth daily. 09/17/22   Rigoberto Noel, MD  ipratropium-albuterol (DUONEB) 0.5-2.5 (3) MG/3ML SOLN Take 3 mLs by nebulization every 6 (six) hours as needed. Patient taking differently: Take 3 mLs by nebulization every 6 (six) hours as needed (for shortness of breath). 11/05/21   Rigoberto Noel, MD  levothyroxine (SYNTHROID) 50 MCG tablet Take 1 tablet (50 mcg total) by mouth daily. 08/29/22   Shamleffer, Melanie Crazier, MD  loratadine (CLARITIN) 10 MG tablet Take 1 tablet (10 mg total) by mouth daily. Patient  taking differently: Take 10 mg by mouth daily as needed for allergies. 05/02/22   Georgette Shell,  MD  montelukast (SINGULAIR) 10 MG tablet Take 1 tablet (10 mg total) by mouth at bedtime. 07/21/22   Danford, Suann Larry, MD  OXYGEN Inhale 4 L into the lungs continuous.    [provider]  potassium chloride (KLOR-CON M) 10 MEQ tablet Take 1 tablet (10 mEq total) by mouth daily. 06/30/22   Biagio Borg, MD  predniSONE (STERAPRED UNI-PAK 21 TAB) 10 MG (21) TBPK tablet Please use per package instruction 09/04/22   Elgergawy, Silver Huguenin, MD   Allergies  Allergen Reactions   Fosamax [Alendronate Sodium] Other (See Comments)    Pt does not remember reaction   Review of Systems  Unable to perform ROS: Mental status change    Physical Exam Vitals and nursing note reviewed.     Vital Signs: BP (!) 125/91   Pulse (!) 119   Temp (!) 97.5 F (36.4 C) (Axillary)   Resp (!) 26   Ht 5' (1.524 m)   Wt 55.9 kg   SpO2 (!) 88%   BMI 24.07 kg/m  Pain Scale: CPOT       SpO2: SpO2: (!) 88 % O2 Device:SpO2: (!) 88 % O2 Flow Rate: .O2 Flow Rate (L/min): 12 L/min  IO: Intake/output summary:  Intake/Output Summary (Last 24 hours) at 10/14/2022 1243 Last data filed at 10/14/2022 1100 Gross per 24 hour  Intake 2172.05 ml  Output 275 ml  Net 1897.05 ml    LBM: Last BM Date :  (PTA) Baseline Weight: Weight: 58 kg Most recent weight: Weight: 55.9 kg     Palliative Assessment/Data:   Flowsheet Rows    Flowsheet Row Most Recent Value  Intake Tab   Referral Department Hospitalist  Unit at Time of Referral ICU  Palliative Care Primary Diagnosis Pulmonary  Date Notified 10/13/22  Palliative Care Type Return patient Palliative Care  Reason for referral Clarify Goals of Care  Date of Admission 10/13/22  Date first seen by Palliative Care 10/14/22  # of days Palliative referral response time 1 Day(s)  # of days IP prior to Palliative referral 0  Clinical Assessment    Palliative Performance Scale Score 20%  Pain Max last 24 hours Not able to report  Pain Min Last 24 hours Not able to report  Dyspnea Max Last 24 Hours Not able to report  Dyspnea Min Last 24 hours Not able to report  Psychosocial & Spiritual Assessment   Palliative Care Outcomes        Time In: 1030 Time Out: 1110 Time Total: 40 minutes  Greater than 50%  of this time was spent counseling and coordinating care related to the above assessment and plan.  Signed by: Drue Novel, NP   Please contact Palliative Medicine Team phone at [phone] for questions and concerns.  For individual provider: See Shea Evans

## 2022-10-14 NOTE — Progress Notes (Signed)
Pt taken off bipap and placed on 4l [place], which she wears at baseline. Pt very confused and agitated. RT will closely monitor in case of need for bipap again

## 2022-10-14 NOTE — Progress Notes (Signed)
[facility] arrived for transport of patient to [facility]. Patient received prn 5 mg morphine PO for transport. Paperwork and [place] DNR sheet provided to [employer].   Patient discharged with RFA PIV and foley catheter. No belongings noted at the bedside.  Updated patient's niece, [name] of transport and she verbalized she would update Donald. Family was appreciative of update and all care for the patient.

## 2022-10-14 NOTE — Progress Notes (Addendum)
[facility]) Hospital Liaison Note  Visited with patient at bedside. Somnolent. Does not respond to tactile or verbal stimuli. On 3 lpm HFNC.  Discussed with Montey Hora, PA transfer of patient to [facility] for symptom management. He agrees patient is stable for transfer and he is agreeable if family is supportive of this plan.  Dimitri Ped and Mr. Tamala Julian to discuss option of moving Ms. Iams to [facility] for comfort focused care. They agree to DNR and not pursuing an further aggressive treatment. Questions answered and [name] and Mr. Tamala Julian are in agreement with transporting patient today.  Please call [facility] when patient is ready for transport as this is a current hospice patient and we contract with [facility] for transport.  Please send completed and signed DNR with patient at discharge.   RN please call report to [phone].  Please call with any questions or concerns.  Thank you, Margaretmary Eddy, BSN, RN [facility] Liaison [phone]

## 2022-10-18 LAB — CULTURE, BLOOD (ROUTINE X 2)
Culture: NO GROWTH
Culture: NO GROWTH

## 2022-10-21 ENCOUNTER — Telehealth: Payer: Self-pay | Admitting: Internal Medicine

## 2022-10-21 NOTE — Telephone Encounter (Signed)
Seth Bake with [facility] calls today to inform us of the unfortunate passing of [facility]. Xylina had passed at 2:56 and peaceful.   Seth Bake stated that death certificate should be sent out to [place] and on the way. If you need to call back Seth Bake can be reached at  [facility]: [phone]

## 2022-10-21 NOTE — Telephone Encounter (Signed)
Ok this is noted 

## 2022-10-31 ENCOUNTER — Ambulatory Visit: Payer: TRICARE For Life (TFL) | Admitting: Internal Medicine

## 2022-11-20 DEATH — deceased

## 2022-11-27 ENCOUNTER — Telehealth: Payer: Self-pay | Admitting: *Deleted

## 2022-11-27 NOTE — Telephone Encounter (Signed)
Patient has open order for PUS for PMB.   Per review of Epic, patient passed 10/2022.   Order cancelled.   Routing to Dr. Rosann Auerbach.   Encounter closed.

## 2022-12-15 ENCOUNTER — Ambulatory Visit (HOSPITAL_BASED_OUTPATIENT_CLINIC_OR_DEPARTMENT_OTHER): Payer: TRICARE For Life (TFL) | Admitting: Pulmonary Disease

## 2023-01-09 ENCOUNTER — Telehealth: Payer: Self-pay | Admitting: Internal Medicine

## 2023-01-09 NOTE — Telephone Encounter (Signed)
Updated pt chart to deceased.Marland KitchenJohny [name]

## 2023-01-09 NOTE — Telephone Encounter (Signed)
Called this patient to schedule a bone density but found out from her niece that this patient passed away in Nov 22, 2022 of this year. Is there a way that we can have her chart state deceased.

## 2023-03-02 ENCOUNTER — Ambulatory Visit: Payer: TRICARE For Life (TFL) | Admitting: Internal Medicine
